# Patient Record
Sex: Female | Born: 1935
Health system: Southern US, Community
[De-identification: ages and names within clinical notes are randomized; demographics above are authoritative.]

## PROBLEM LIST (undated history)

## (undated) DIAGNOSIS — K562 Volvulus: Secondary | ICD-10-CM

## (undated) DIAGNOSIS — G459 Transient cerebral ischemic attack, unspecified: Secondary | ICD-10-CM

## (undated) DIAGNOSIS — I517 Cardiomegaly: Secondary | ICD-10-CM

## (undated) DIAGNOSIS — G20A1 Parkinson's disease without dyskinesia, without mention of fluctuations: Secondary | ICD-10-CM

## (undated) DIAGNOSIS — Q631 Lobulated, fused and horseshoe kidney: Secondary | ICD-10-CM

## (undated) DIAGNOSIS — R600 Localized edema: Secondary | ICD-10-CM

## (undated) DIAGNOSIS — I809 Phlebitis and thrombophlebitis of unspecified site: Secondary | ICD-10-CM

## (undated) DIAGNOSIS — K219 Gastro-esophageal reflux disease without esophagitis: Secondary | ICD-10-CM

## (undated) DIAGNOSIS — I2510 Atherosclerotic heart disease of native coronary artery without angina pectoris: Secondary | ICD-10-CM

## (undated) DIAGNOSIS — K5900 Constipation, unspecified: Secondary | ICD-10-CM

## (undated) DIAGNOSIS — G629 Polyneuropathy, unspecified: Secondary | ICD-10-CM

## (undated) DIAGNOSIS — N183 Chronic kidney disease, stage 3 (moderate): Secondary | ICD-10-CM

## (undated) DIAGNOSIS — G9340 Encephalopathy, unspecified: Secondary | ICD-10-CM

## (undated) DIAGNOSIS — D649 Anemia, unspecified: Secondary | ICD-10-CM

## (undated) DIAGNOSIS — F32A Depression, unspecified: Secondary | ICD-10-CM

## (undated) DIAGNOSIS — K869 Disease of pancreas, unspecified: Secondary | ICD-10-CM

## (undated) DIAGNOSIS — I4891 Unspecified atrial fibrillation: Principal | ICD-10-CM

## (undated) DIAGNOSIS — F329 Major depressive disorder, single episode, unspecified: Secondary | ICD-10-CM

## (undated) DIAGNOSIS — G20 Parkinson's disease: Secondary | ICD-10-CM

## (undated) DIAGNOSIS — R4182 Altered mental status, unspecified: Secondary | ICD-10-CM

## (undated) DIAGNOSIS — K224 Dyskinesia of esophagus: Secondary | ICD-10-CM

## (undated) DIAGNOSIS — Z86010 Personal history of colonic polyps: Secondary | ICD-10-CM

## (undated) DIAGNOSIS — I10 Essential (primary) hypertension: Secondary | ICD-10-CM

## (undated) DIAGNOSIS — E785 Hyperlipidemia, unspecified: Secondary | ICD-10-CM

## (undated) HISTORY — PX: ABDOMINAL HYSTERECTOMY: SHX81

## (undated) HISTORY — PX: BREAST SURGERY: SHX581

## (undated) HISTORY — DX: Disease of pancreas, unspecified: K86.9

## (undated) HISTORY — DX: Volvulus: K56.2

## (undated) HISTORY — DX: Depression, unspecified: F32.A

## (undated) HISTORY — DX: Dyskinesia of esophagus: K22.4

## (undated) HISTORY — PX: FOOT SURGERY: SHX648

## (undated) HISTORY — DX: Personal history of colonic polyps: Z86.010

## (undated) HISTORY — DX: Anemia, unspecified: D64.9

## (undated) HISTORY — DX: Constipation, unspecified: K59.00

## (undated) HISTORY — DX: Lobulated, fused and horseshoe kidney: Q63.1

## (undated) HISTORY — DX: Phlebitis and thrombophlebitis of unspecified site: I80.9

## (undated) HISTORY — PX: ESOPHAGEAL DILATION: SHX303

## (undated) HISTORY — PX: CHOLECYSTECTOMY: SHX55

## (undated) HISTORY — DX: Major depressive disorder, single episode, unspecified: F32.9

## (undated) HISTORY — PX: NOSE SURGERY: SHX723

## (undated) HISTORY — DX: Chronic kidney disease, stage 3 (moderate): N18.3

## (undated) HISTORY — PX: TOTAL HIP ARTHROPLASTY: SHX124

## (undated) HISTORY — DX: Transient cerebral ischemic attack, unspecified: G45.9

## (undated) HISTORY — DX: Polyneuropathy, unspecified: G62.9

## (undated) SURGERY — SIGMOIDOSCOPY, FLEXIBLE
Anesthesia: Moderate Sedation

---

## 1997-09-03 ENCOUNTER — Ambulatory Visit (HOSPITAL_BASED_OUTPATIENT_CLINIC_OR_DEPARTMENT_OTHER): Admission: RE | Admit: 1997-09-03 | Discharge: 1997-09-03 | Payer: Self-pay | Admitting: Urology

## 1997-09-09 ENCOUNTER — Ambulatory Visit (HOSPITAL_COMMUNITY): Admission: RE | Admit: 1997-09-09 | Discharge: 1997-09-09 | Payer: Self-pay | Admitting: Orthopedic Surgery

## 1997-09-23 ENCOUNTER — Ambulatory Visit (HOSPITAL_COMMUNITY): Admission: RE | Admit: 1997-09-23 | Discharge: 1997-09-23 | Payer: Self-pay | Admitting: Orthopedic Surgery

## 1997-10-13 ENCOUNTER — Ambulatory Visit (HOSPITAL_BASED_OUTPATIENT_CLINIC_OR_DEPARTMENT_OTHER): Admission: RE | Admit: 1997-10-13 | Discharge: 1997-10-13 | Payer: Self-pay | Admitting: Orthopedic Surgery

## 1998-02-24 ENCOUNTER — Encounter: Admission: RE | Admit: 1998-02-24 | Discharge: 1998-05-25 | Payer: Self-pay

## 1998-03-18 ENCOUNTER — Encounter: Admission: RE | Admit: 1998-03-18 | Discharge: 1998-06-16 | Payer: Self-pay

## 1998-11-23 ENCOUNTER — Encounter: Payer: Self-pay | Admitting: Internal Medicine

## 1998-11-23 ENCOUNTER — Ambulatory Visit (HOSPITAL_COMMUNITY): Admission: RE | Admit: 1998-11-23 | Discharge: 1998-11-23 | Payer: Self-pay | Admitting: Internal Medicine

## 1999-05-16 ENCOUNTER — Ambulatory Visit (HOSPITAL_COMMUNITY): Admission: RE | Admit: 1999-05-16 | Discharge: 1999-05-16 | Payer: Self-pay | Admitting: Internal Medicine

## 1999-05-16 ENCOUNTER — Encounter: Payer: Self-pay | Admitting: Internal Medicine

## 1999-06-06 HISTORY — PX: SHOULDER SURGERY: SHX246

## 1999-06-14 ENCOUNTER — Encounter: Admission: RE | Admit: 1999-06-14 | Discharge: 1999-06-14 | Payer: Self-pay | Admitting: Internal Medicine

## 1999-06-14 ENCOUNTER — Encounter: Payer: Self-pay | Admitting: Internal Medicine

## 1999-06-24 ENCOUNTER — Other Ambulatory Visit: Admission: RE | Admit: 1999-06-24 | Discharge: 1999-06-24 | Payer: Self-pay | Admitting: Gastroenterology

## 1999-06-24 DIAGNOSIS — Z860101 Personal history of adenomatous and serrated colon polyps: Secondary | ICD-10-CM

## 1999-06-24 DIAGNOSIS — Z86010 Personal history of colonic polyps: Secondary | ICD-10-CM

## 1999-06-24 HISTORY — DX: Personal history of colonic polyps: Z86.010

## 1999-06-24 HISTORY — DX: Personal history of adenomatous and serrated colon polyps: Z86.0101

## 1999-08-19 ENCOUNTER — Encounter: Admission: RE | Admit: 1999-08-19 | Discharge: 1999-08-19 | Payer: Self-pay | Admitting: Orthopaedic Surgery

## 1999-08-22 ENCOUNTER — Ambulatory Visit (HOSPITAL_BASED_OUTPATIENT_CLINIC_OR_DEPARTMENT_OTHER): Admission: RE | Admit: 1999-08-22 | Discharge: 1999-08-22 | Payer: Self-pay | Admitting: Orthopaedic Surgery

## 1999-12-02 ENCOUNTER — Encounter: Admission: RE | Admit: 1999-12-02 | Discharge: 1999-12-21 | Payer: Self-pay | Admitting: Orthopaedic Surgery

## 2000-02-21 ENCOUNTER — Encounter: Payer: Self-pay | Admitting: Internal Medicine

## 2000-02-21 ENCOUNTER — Encounter: Admission: RE | Admit: 2000-02-21 | Discharge: 2000-02-21 | Payer: Self-pay | Admitting: Internal Medicine

## 2000-05-08 ENCOUNTER — Encounter: Admission: RE | Admit: 2000-05-08 | Discharge: 2000-05-08 | Payer: Self-pay | Admitting: Urology

## 2000-05-08 ENCOUNTER — Encounter: Payer: Self-pay | Admitting: Urology

## 2000-08-10 ENCOUNTER — Ambulatory Visit (HOSPITAL_BASED_OUTPATIENT_CLINIC_OR_DEPARTMENT_OTHER): Admission: RE | Admit: 2000-08-10 | Discharge: 2000-08-10 | Payer: Self-pay | Admitting: Internal Medicine

## 2011-05-08 DIAGNOSIS — R488 Other symbolic dysfunctions: Secondary | ICD-10-CM | POA: Diagnosis not present

## 2011-05-08 DIAGNOSIS — I2510 Atherosclerotic heart disease of native coronary artery without angina pectoris: Secondary | ICD-10-CM | POA: Diagnosis not present

## 2011-05-08 DIAGNOSIS — Z9181 History of falling: Secondary | ICD-10-CM | POA: Diagnosis not present

## 2011-05-08 DIAGNOSIS — H409 Unspecified glaucoma: Secondary | ICD-10-CM | POA: Diagnosis not present

## 2011-05-08 DIAGNOSIS — R279 Unspecified lack of coordination: Secondary | ICD-10-CM | POA: Diagnosis not present

## 2011-05-08 DIAGNOSIS — F329 Major depressive disorder, single episode, unspecified: Secondary | ICD-10-CM | POA: Diagnosis not present

## 2011-05-08 DIAGNOSIS — G20 Parkinson's disease: Secondary | ICD-10-CM | POA: Diagnosis not present

## 2011-05-08 DIAGNOSIS — G9349 Other encephalopathy: Secondary | ICD-10-CM | POA: Diagnosis not present

## 2011-05-08 DIAGNOSIS — M6281 Muscle weakness (generalized): Secondary | ICD-10-CM | POA: Diagnosis not present

## 2011-05-08 DIAGNOSIS — I10 Essential (primary) hypertension: Secondary | ICD-10-CM | POA: Diagnosis not present

## 2011-05-08 DIAGNOSIS — E119 Type 2 diabetes mellitus without complications: Secondary | ICD-10-CM | POA: Diagnosis not present

## 2011-05-08 DIAGNOSIS — I4891 Unspecified atrial fibrillation: Secondary | ICD-10-CM | POA: Diagnosis not present

## 2011-05-10 DIAGNOSIS — M6281 Muscle weakness (generalized): Secondary | ICD-10-CM | POA: Diagnosis not present

## 2011-05-10 DIAGNOSIS — G20 Parkinson's disease: Secondary | ICD-10-CM | POA: Diagnosis not present

## 2011-05-10 DIAGNOSIS — R279 Unspecified lack of coordination: Secondary | ICD-10-CM | POA: Diagnosis not present

## 2011-05-10 DIAGNOSIS — R488 Other symbolic dysfunctions: Secondary | ICD-10-CM | POA: Diagnosis not present

## 2011-05-10 DIAGNOSIS — E119 Type 2 diabetes mellitus without complications: Secondary | ICD-10-CM | POA: Diagnosis not present

## 2011-05-10 DIAGNOSIS — G9349 Other encephalopathy: Secondary | ICD-10-CM | POA: Diagnosis not present

## 2011-05-12 DIAGNOSIS — M6281 Muscle weakness (generalized): Secondary | ICD-10-CM | POA: Diagnosis not present

## 2011-05-12 DIAGNOSIS — G20 Parkinson's disease: Secondary | ICD-10-CM | POA: Diagnosis not present

## 2011-05-12 DIAGNOSIS — R488 Other symbolic dysfunctions: Secondary | ICD-10-CM | POA: Diagnosis not present

## 2011-05-12 DIAGNOSIS — R279 Unspecified lack of coordination: Secondary | ICD-10-CM | POA: Diagnosis not present

## 2011-05-12 DIAGNOSIS — G9349 Other encephalopathy: Secondary | ICD-10-CM | POA: Diagnosis not present

## 2011-05-12 DIAGNOSIS — E119 Type 2 diabetes mellitus without complications: Secondary | ICD-10-CM | POA: Diagnosis not present

## 2011-05-15 DIAGNOSIS — R488 Other symbolic dysfunctions: Secondary | ICD-10-CM | POA: Diagnosis not present

## 2011-05-15 DIAGNOSIS — M6281 Muscle weakness (generalized): Secondary | ICD-10-CM | POA: Diagnosis not present

## 2011-05-15 DIAGNOSIS — E119 Type 2 diabetes mellitus without complications: Secondary | ICD-10-CM | POA: Diagnosis not present

## 2011-05-15 DIAGNOSIS — G20 Parkinson's disease: Secondary | ICD-10-CM | POA: Diagnosis not present

## 2011-05-15 DIAGNOSIS — G9349 Other encephalopathy: Secondary | ICD-10-CM | POA: Diagnosis not present

## 2011-05-15 DIAGNOSIS — R279 Unspecified lack of coordination: Secondary | ICD-10-CM | POA: Diagnosis not present

## 2011-05-17 DIAGNOSIS — R279 Unspecified lack of coordination: Secondary | ICD-10-CM | POA: Diagnosis not present

## 2011-05-17 DIAGNOSIS — G20 Parkinson's disease: Secondary | ICD-10-CM | POA: Diagnosis not present

## 2011-05-17 DIAGNOSIS — E119 Type 2 diabetes mellitus without complications: Secondary | ICD-10-CM | POA: Diagnosis not present

## 2011-05-17 DIAGNOSIS — R488 Other symbolic dysfunctions: Secondary | ICD-10-CM | POA: Diagnosis not present

## 2011-05-17 DIAGNOSIS — M6281 Muscle weakness (generalized): Secondary | ICD-10-CM | POA: Diagnosis not present

## 2011-05-17 DIAGNOSIS — G9349 Other encephalopathy: Secondary | ICD-10-CM | POA: Diagnosis not present

## 2011-05-18 DIAGNOSIS — M6281 Muscle weakness (generalized): Secondary | ICD-10-CM | POA: Diagnosis not present

## 2011-05-18 DIAGNOSIS — R488 Other symbolic dysfunctions: Secondary | ICD-10-CM | POA: Diagnosis not present

## 2011-05-18 DIAGNOSIS — R279 Unspecified lack of coordination: Secondary | ICD-10-CM | POA: Diagnosis not present

## 2011-05-18 DIAGNOSIS — G9349 Other encephalopathy: Secondary | ICD-10-CM | POA: Diagnosis not present

## 2011-05-18 DIAGNOSIS — G20 Parkinson's disease: Secondary | ICD-10-CM | POA: Diagnosis not present

## 2011-05-18 DIAGNOSIS — E119 Type 2 diabetes mellitus without complications: Secondary | ICD-10-CM | POA: Diagnosis not present

## 2011-05-19 DIAGNOSIS — R279 Unspecified lack of coordination: Secondary | ICD-10-CM | POA: Diagnosis not present

## 2011-05-19 DIAGNOSIS — G20 Parkinson's disease: Secondary | ICD-10-CM | POA: Diagnosis not present

## 2011-05-19 DIAGNOSIS — R488 Other symbolic dysfunctions: Secondary | ICD-10-CM | POA: Diagnosis not present

## 2011-05-19 DIAGNOSIS — E119 Type 2 diabetes mellitus without complications: Secondary | ICD-10-CM | POA: Diagnosis not present

## 2011-05-19 DIAGNOSIS — M6281 Muscle weakness (generalized): Secondary | ICD-10-CM | POA: Diagnosis not present

## 2011-05-19 DIAGNOSIS — G9349 Other encephalopathy: Secondary | ICD-10-CM | POA: Diagnosis not present

## 2011-05-22 ENCOUNTER — Emergency Department (HOSPITAL_COMMUNITY): Payer: Medicare Other

## 2011-05-22 ENCOUNTER — Inpatient Hospital Stay (HOSPITAL_COMMUNITY)
Admission: EM | Admit: 2011-05-22 | Discharge: 2011-05-24 | DRG: 309 | Disposition: A | Discharge status: Skilled Nursing Facility | Payer: Medicare Other | Attending: Internal Medicine | Admitting: Internal Medicine

## 2011-05-22 ENCOUNTER — Other Ambulatory Visit: Payer: Self-pay

## 2011-05-22 ENCOUNTER — Encounter: Payer: Self-pay | Admitting: Physical Medicine and Rehabilitation

## 2011-05-22 DIAGNOSIS — I5032 Chronic diastolic (congestive) heart failure: Secondary | ICD-10-CM | POA: Diagnosis present

## 2011-05-22 DIAGNOSIS — K8689 Other specified diseases of pancreas: Secondary | ICD-10-CM

## 2011-05-22 DIAGNOSIS — J90 Pleural effusion, not elsewhere classified: Secondary | ICD-10-CM

## 2011-05-22 DIAGNOSIS — S301XXA Contusion of abdominal wall, initial encounter: Secondary | ICD-10-CM

## 2011-05-22 DIAGNOSIS — E119 Type 2 diabetes mellitus without complications: Secondary | ICD-10-CM | POA: Diagnosis present

## 2011-05-22 DIAGNOSIS — G9349 Other encephalopathy: Secondary | ICD-10-CM | POA: Diagnosis not present

## 2011-05-22 DIAGNOSIS — R488 Other symbolic dysfunctions: Secondary | ICD-10-CM | POA: Diagnosis not present

## 2011-05-22 DIAGNOSIS — Z23 Encounter for immunization: Secondary | ICD-10-CM

## 2011-05-22 DIAGNOSIS — K869 Disease of pancreas, unspecified: Secondary | ICD-10-CM

## 2011-05-22 DIAGNOSIS — I4891 Unspecified atrial fibrillation: Principal | ICD-10-CM | POA: Diagnosis present

## 2011-05-22 DIAGNOSIS — G20 Parkinson's disease: Secondary | ICD-10-CM | POA: Diagnosis not present

## 2011-05-22 DIAGNOSIS — R8281 Pyuria: Secondary | ICD-10-CM

## 2011-05-22 DIAGNOSIS — I509 Heart failure, unspecified: Secondary | ICD-10-CM | POA: Diagnosis present

## 2011-05-22 DIAGNOSIS — G20A1 Parkinson's disease without dyskinesia, without mention of fluctuations: Secondary | ICD-10-CM | POA: Diagnosis present

## 2011-05-22 DIAGNOSIS — M6281 Muscle weakness (generalized): Secondary | ICD-10-CM | POA: Diagnosis not present

## 2011-05-22 DIAGNOSIS — N390 Urinary tract infection, site not specified: Secondary | ICD-10-CM

## 2011-05-22 DIAGNOSIS — Z7982 Long term (current) use of aspirin: Secondary | ICD-10-CM

## 2011-05-22 DIAGNOSIS — Z7902 Long term (current) use of antithrombotics/antiplatelets: Secondary | ICD-10-CM

## 2011-05-22 DIAGNOSIS — R279 Unspecified lack of coordination: Secondary | ICD-10-CM | POA: Diagnosis not present

## 2011-05-22 DIAGNOSIS — K219 Gastro-esophageal reflux disease without esophagitis: Secondary | ICD-10-CM | POA: Diagnosis present

## 2011-05-22 DIAGNOSIS — R002 Palpitations: Secondary | ICD-10-CM

## 2011-05-22 DIAGNOSIS — R0789 Other chest pain: Secondary | ICD-10-CM | POA: Diagnosis present

## 2011-05-22 HISTORY — DX: Encephalopathy, unspecified: G93.40

## 2011-05-22 HISTORY — DX: Gastro-esophageal reflux disease without esophagitis: K21.9

## 2011-05-22 HISTORY — DX: Parkinson's disease: G20

## 2011-05-22 HISTORY — DX: Atherosclerotic heart disease of native coronary artery without angina pectoris: I25.10

## 2011-05-22 HISTORY — DX: Parkinson's disease without dyskinesia, without mention of fluctuations: G20.A1

## 2011-05-22 HISTORY — DX: Hyperlipidemia, unspecified: E78.5

## 2011-05-22 HISTORY — DX: Essential (primary) hypertension: I10

## 2011-05-22 HISTORY — DX: Disease of pancreas, unspecified: K86.9

## 2011-05-22 HISTORY — DX: Altered mental status, unspecified: R41.82

## 2011-05-22 HISTORY — DX: Unspecified atrial fibrillation: I48.91

## 2011-05-22 LAB — PROTIME-INR
INR: 1.1 (ref 0.00–1.49)
Prothrombin Time: 14.4 seconds (ref 11.6–15.2)

## 2011-05-22 LAB — APTT: aPTT: 30 seconds (ref 24–37)

## 2011-05-22 LAB — URINE MICROSCOPIC-ADD ON

## 2011-05-22 LAB — BASIC METABOLIC PANEL
BUN: 10 mg/dL (ref 6–23)
Calcium: 9.1 mg/dL (ref 8.4–10.5)
Creatinine, Ser: 0.69 mg/dL (ref 0.50–1.10)
GFR calc Af Amer: >90 mL/min (ref >90)

## 2011-05-22 LAB — URINALYSIS, ROUTINE W REFLEX MICROSCOPIC
Bilirubin Urine: NEGATIVE
Ketones, ur: NEGATIVE mg/dL
Nitrite: NEGATIVE
Protein, ur: NEGATIVE mg/dL
Urobilinogen, UA: 0.2 mg/dL (ref 0.0–1.0)
pH: 7.5 (ref 5.0–8.0)

## 2011-05-22 LAB — DIFFERENTIAL
Basophils Absolute: 0 10*3/uL (ref 0.0–0.1)
Basophils Relative: 0 % (ref 0–1)
Eosinophils Relative: 2 % (ref 0–5)
Lymphocytes Relative: 20 % (ref 12–46)
Monocytes Absolute: 0.3 10*3/uL (ref 0.1–1.0)

## 2011-05-22 LAB — CBC
HCT: 35.2 % — ABNORMAL LOW (ref 36.0–46.0)
MCH: 32.5 pg (ref 26.0–34.0)
MCHC: 32.4 g/dL (ref 30.0–36.0)
MCV: 97.8 fL (ref 78.0–100.0)
Platelets: 116 10*3/uL — ABNORMAL LOW (ref 150–400)
Platelets: 143 10*3/uL — ABNORMAL LOW (ref 150–400)
RBC: 3.66 MIL/uL — ABNORMAL LOW (ref 3.87–5.11)
RDW: 14 % (ref 11.5–15.5)
WBC: 5.1 10*3/uL (ref 4.0–10.5)
WBC: 5.9 10*3/uL (ref 4.0–10.5)

## 2011-05-22 LAB — CREATININE, SERUM
Creatinine, Ser: 0.68 mg/dL (ref 0.50–1.10)
GFR calc Af Amer: >90 mL/min (ref >90)

## 2011-05-22 LAB — PRO B NATRIURETIC PEPTIDE: Pro B Natriuretic peptide (BNP): 2005 pg/mL — ABNORMAL HIGH (ref 0–450)

## 2011-05-22 MED ORDER — ACETAMINOPHEN 650 MG RE SUPP: 650.0000 mg | Freq: Four times a day (QID) | RECTAL | Status: DC | PRN

## 2011-05-22 MED ORDER — DORZOLAMIDE HCL 2 % OP SOLN
1.0000 [drp] | Freq: Three times a day (TID) | OPHTHALMIC | Status: DC
  Administered 2011-05-22 – 2011-05-23 (×4): 1 [drp] via OPHTHALMIC
  Filled 2011-05-22: qty 10

## 2011-05-22 MED ORDER — PANTOPRAZOLE SODIUM 40 MG PO TBEC
40.0000 mg | DELAYED_RELEASE_TABLET | Freq: Every day | ORAL | Status: DC
  Administered 2011-05-23 (×2): 40 mg via ORAL
  Filled 2011-05-22 (×2): qty 1

## 2011-05-22 MED ORDER — INSULIN ASPART 100 UNIT/ML ~~LOC~~ SOLN
0.0000 [IU] | SUBCUTANEOUS | Status: DC
  Administered 2011-05-23: 3 [IU] via SUBCUTANEOUS
  Administered 2011-05-23: 2 [IU] via SUBCUTANEOUS
  Administered 2011-05-23: 1 [IU] via SUBCUTANEOUS
  Administered 2011-05-23: 2 [IU] via SUBCUTANEOUS
  Administered 2011-05-23: 3 [IU] via SUBCUTANEOUS
  Administered 2011-05-23 – 2011-05-24 (×3): 2 [IU] via SUBCUTANEOUS
  Filled 2011-05-22: qty 3

## 2011-05-22 MED ORDER — PATIENT'S GUIDE TO USING COUMADIN BOOK
Freq: Once | Status: DC
  Filled 2011-05-22 (×2): qty 1

## 2011-05-22 MED ORDER — CLOPIDOGREL BISULFATE 75 MG PO TABS
75.0000 mg | ORAL_TABLET | Freq: Every day | ORAL | Status: DC
  Administered 2011-05-22 – 2011-05-24 (×3): 75 mg via ORAL
  Filled 2011-05-22 (×3): qty 1

## 2011-05-22 MED ORDER — TRAVOPROST (BAK FREE) 0.004 % OP SOLN
1.0000 [drp] | Freq: Every day | OPHTHALMIC | Status: DC
  Administered 2011-05-22 – 2011-05-23 (×2): 1 [drp] via OPHTHALMIC
  Filled 2011-05-22: qty 2.5

## 2011-05-22 MED ORDER — LISINOPRIL 10 MG PO TABS
10.0000 mg | ORAL_TABLET | Freq: Every day | ORAL | Status: DC
  Administered 2011-05-22 – 2011-05-24 (×3): 10 mg via ORAL
  Filled 2011-05-22 (×3): qty 1

## 2011-05-22 MED ORDER — OLOPATADINE HCL 0.1 % OP SOLN
1.0000 [drp] | Freq: Every day | OPHTHALMIC | Status: DC
  Administered 2011-05-23: 1 [drp] via OPHTHALMIC
  Filled 2011-05-22 (×2): qty 5

## 2011-05-22 MED ORDER — GABAPENTIN 100 MG PO CAPS
100.0000 mg | ORAL_CAPSULE | Freq: Two times a day (BID) | ORAL | Status: DC
  Administered 2011-05-22 – 2011-05-24 (×4): 100 mg via ORAL
  Filled 2011-05-22 (×5): qty 1

## 2011-05-22 MED ORDER — SODIUM CHLORIDE 0.9 % IJ SOLN
3.0000 mL | Freq: Two times a day (BID) | INTRAMUSCULAR | Status: DC
  Administered 2011-05-23 – 2011-05-24 (×2): 3 mL via INTRAVENOUS

## 2011-05-22 MED ORDER — ACETAMINOPHEN 325 MG PO TABS
650.0000 mg | ORAL_TABLET | Freq: Four times a day (QID) | ORAL | Status: DC | PRN
  Administered 2011-05-23: 650 mg via ORAL
  Filled 2011-05-22: qty 2

## 2011-05-22 MED ORDER — FUROSEMIDE 10 MG/ML IJ SOLN
40.0000 mg | Freq: Once | INTRAMUSCULAR | Status: AC
  Administered 2011-05-22: 40 mg via INTRAVENOUS
  Filled 2011-05-22: qty 4

## 2011-05-22 MED ORDER — POLYVINYL ALCOHOL 1.4 % OP SOLN
1.0000 [drp] | Freq: Four times a day (QID) | OPHTHALMIC | Status: DC
  Administered 2011-05-22 – 2011-05-23 (×5): 1 [drp] via OPHTHALMIC
  Filled 2011-05-22: qty 15

## 2011-05-22 MED ORDER — ACETAMINOPHEN 325 MG PO TABS
650.0000 mg | ORAL_TABLET | Freq: Once | ORAL | Status: AC
  Administered 2011-05-22: 650 mg via ORAL
  Filled 2011-05-22: qty 2

## 2011-05-22 MED ORDER — IOHEXOL 300 MG/ML  SOLN: 100.0000 mL | Freq: Once | INTRAMUSCULAR | Status: DC | PRN

## 2011-05-22 MED ORDER — HEPARIN BOLUS VIA INFUSION
3000.0000 [IU] | Freq: Once | INTRAVENOUS | Status: AC
  Administered 2011-05-22: 3000 [IU] via INTRAVENOUS
  Filled 2011-05-22: qty 3000

## 2011-05-22 MED ORDER — WARFARIN VIDEO: Freq: Once | Status: DC

## 2011-05-22 MED ORDER — METOPROLOL TARTRATE 50 MG PO TABS
50.0000 mg | ORAL_TABLET | Freq: Two times a day (BID) | ORAL | Status: DC
  Administered 2011-05-22 – 2011-05-23 (×2): 50 mg via ORAL
  Filled 2011-05-22 (×3): qty 1

## 2011-05-22 MED ORDER — WARFARIN SODIUM 5 MG PO TABS
5.0000 mg | ORAL_TABLET | ORAL | Status: AC
  Administered 2011-05-22: 5 mg via ORAL
  Filled 2011-05-22: qty 1

## 2011-05-22 MED ORDER — POLYETHYL GLYCOL-PROPYL GLYCOL 0.4-0.3 % OP SOLN: 1.0000 [drp] | Freq: Four times a day (QID) | OPHTHALMIC | Status: DC

## 2011-05-22 MED ORDER — HEPARIN SOD (PORCINE) IN D5W 100 UNIT/ML IV SOLN
850.0000 [IU]/h | INTRAVENOUS | Status: DC
  Administered 2011-05-22: 850 [IU]/h via INTRAVENOUS
  Filled 2011-05-22 (×2): qty 250

## 2011-05-22 MED ORDER — TRAZODONE HCL 100 MG PO TABS
100.0000 mg | ORAL_TABLET | Freq: Every day | ORAL | Status: DC
  Administered 2011-05-22 – 2011-05-23 (×2): 100 mg via ORAL
  Filled 2011-05-22 (×3): qty 1

## 2011-05-22 MED ORDER — SIMVASTATIN 10 MG PO TABS
10.0000 mg | ORAL_TABLET | Freq: Every day | ORAL | Status: DC
  Administered 2011-05-23: 10 mg via ORAL
  Filled 2011-05-22 (×2): qty 1

## 2011-05-22 MED ORDER — POLYVINYL ALCOHOL 1.4 % OP SOLN: 1.0000 [drp] | Freq: Every day | OPHTHALMIC | Status: DC

## 2011-05-22 MED ORDER — DEXTROSE 5 % IV SOLN
1.0000 g | Freq: Once | INTRAVENOUS | Status: AC
  Administered 2011-05-22: 1 g via INTRAVENOUS
  Filled 2011-05-22: qty 10

## 2011-05-22 MED ORDER — SODIUM CHLORIDE 0.9 % IV SOLN
INTRAVENOUS | Status: DC
  Administered 2011-05-22 (×2): via INTRAVENOUS

## 2011-05-22 NOTE — ED Notes (Signed)
Pt resting quietly at the time. Remains on cardiac monitor. Vital signs stable. Pt is alert and answering questions appropriately. Family at bedside.

## 2011-05-22 NOTE — ED Notes (Signed)
Pt in CT scan at the time. Family remains at the bedside.

## 2011-05-22 NOTE — ED Provider Notes (Cosign Needed)
History     CSN: [id number] Arrival date & time: 05/22/2011  9:21 AM   Chief Complaint  Patient presents with  . Atrial Fibrillation  . Dizziness   The history is provided by the patient, the nursing home and the EMS personnel. History Limited By: poor historian.   Pt was seen at 0940.  Per pt, c/o gradual onset and persistence of constant "palpitations" since waking up this morning.  Palpitations were associated with SOB and "dizziness."  Pt also c/o gradual onset and worsening of persistent pedal edema for the past week.  Denies fevers, no cough, no abd pain, no N/V/D, no back pain, no CP.  Past Medical History  Diagnosis Date  . Atrial fibrillation   . Altered mental status   . Encephalopathy   . Parkinson disease   . Hypertension   . GERD (gastroesophageal reflux disease)   . Hyperlipemia   . Diabetes mellitus     No past surgical history on file.   History  Substance Use Topics  . Smoking status: Never Smoker   . Smokeless tobacco: Not on file  . Alcohol Use: No     Review of Systems  Unable to perform ROS: Other    Allergies  Review of patient's allergies indicates no known allergies.  Home Medications   Current Outpatient Rx  Name Route Sig Dispense Refill  . ACETAMINOPHEN 325 MG PO TABS Oral Take 650 mg by mouth every 6 (six) hours as needed. Pain or fever     . ASPIRIN 81 MG PO TABS Oral Take 81 mg by mouth daily.      Marland Kitchen BAZA EX Apply externally Apply 1 application topically 3 (three) times daily.      . CHOLECALCIFEROL 1000 UNITS PO CAPS Oral Take 1,000 Units by mouth daily.      Marland Kitchen CLOPIDOGREL BISULFATE 75 MG PO TABS Oral Take 75 mg by mouth daily.      . DORZOLAMIDE HCL 2 % OP SOLN Both Eyes Place 1 drop into both eyes 3 (three) times daily.      . FUROSEMIDE 20 MG PO TABS Oral Take 20 mg by mouth 2 (two) times daily.      Marland Kitchen GABAPENTIN 100 MG PO CAPS Oral Take 100 mg by mouth 2 (two) times daily.     Marland Kitchen LISINOPRIL 10 MG PO TABS Oral Take 10 mg by  mouth daily.      Marland Kitchen METFORMIN HCL 500 MG PO TABS Oral Take 500 mg by mouth 2 (two) times daily with a meal.      . METOPROLOL TARTRATE 50 MG PO TABS Oral Take 50 mg by mouth 2 (two) times daily.      . OLOPATADINE HCL 0.1 % OP SOLN Both Eyes Place 1 drop into both eyes daily.      Marland Kitchen OMEPRAZOLE 20 MG PO CPDR Oral Take 20 mg by mouth daily.      Marland Kitchen POLYETHYL GLYCOL-PROPYL GLYCOL 0.4-0.3 % OP SOLN Ophthalmic Apply 1 drop to eye 4 (four) times daily.      Marland Kitchen POLYVINYL ALCOHOL 1.4 % OP SOLN Both Eyes Place 1 drop into both eyes daily.      Marland Kitchen POTASSIUM CHLORIDE CRYS CR 20 MEQ PO TBCR Oral Take 20 mEq by mouth daily.     Marland Kitchen PRAVASTATIN SODIUM 20 MG PO TABS Oral Take 20 mg by mouth daily.      . TRAMADOL HCL 50 MG PO TABS Oral Take 50 mg by mouth  every 6 (six) hours as needed. For pain     . TRAVOPROST (BAK FREE) 0.004 % OP SOLN Both Eyes Place 1 drop into both eyes at bedtime.      . TRAZODONE HCL 50 MG PO TABS Oral Take 100 mg by mouth at bedtime.       BP 173/80  Pulse 77  Temp(Src) 98.7 F (37.1 C) (Oral)  Resp 14  SpO2 99%  Physical Exam 0945: Physical examination:  Nursing notes reviewed; Vital signs and O2 SAT reviewed;  Constitutional: Well developed, Well nourished, In no acute distress; Head:  Normocephalic, atraumatic; Eyes: EOMI, PERRL, No scleral icterus; ENMT: Mouth and pharynx normal, Mucous membranes dry; Neck: Supple, Full range of motion, No lymphadenopathy; Cardiovascular: Irregular irregular rate and rhythm, No murmur or gallop; Respiratory: Breath sounds clear & equal bilaterally, No rales, rhonchi, wheezes, or rub, Normal respiratory effort/excursion; Chest: Nontender, Movement normal; Abdomen: Soft, +large ecchymosis to right lower abd wall with localized TTP otherwise abd is nontender, Nondistended, Normal bowel sounds; Genitourinary: No CVA tenderness; Extremities: Pulses normal, No tenderness, 2+ pedal edema bilat, No calf edema or asymmetry.; Neuro: AA&Ox3, poor historian,  +HOH, otherwise major CN grossly intact.  No facial droop, speech clear.  No gross focal motor or sensory deficits in extremities.; Skin: Color normal, Warm, Dry, no rash.   ED Course  Procedures   0945:  Pt has large ecchymosis on her right lower abd area.  Cannot tell me what happened or how long it has been there.  Will CT A/P.    MDM  MDM Reviewed: vitals and nursing note Reviewed previous: labs Interpretation: ECG, labs, x-ray and CT scan    Date: 05/22/2011  Rate: 92  Rhythm: atrial fibrillation  QRS Axis: normal  Intervals: normal  ST/T Wave abnormalities: normal  Conduction Disutrbances:none  Narrative Interpretation:   Old EKG Reviewed: none available.  Results for orders placed during the hospital encounter of 05/22/11  BASIC METABOLIC PANEL      Component Value Range   Sodium 139  135 - 145 (mEq/L)   Potassium 3.8  3.5 - 5.1 (mEq/L)   Chloride 102  96 - 112 (mEq/L)   CO2 28  19 - 32 (mEq/L)   Glucose, Bld 269 (*) 70 - 99 (mg/dL)   BUN 10  6 - 23 (mg/dL)   Creatinine, Ser 8.11  0.50 - 1.10 (mg/dL)   Calcium 9.1  8.4 - 91.4 (mg/dL)   GFR calc non Af Amer 78 (*) >90 (mL/min)   GFR calc Af Amer >90  >90 (mL/min)  CBC      Component Value Range   WBC 5.1  4.0 - 10.5 (K/uL)   RBC 3.60 (*) 3.87 - 5.11 (MIL/uL)   Hemoglobin 11.4 (*) 12.0 - 15.0 (g/dL)   HCT 78.2 (*) 95.6 - 46.0 (%)   MCV 97.8  78.0 - 100.0 (fL)   MCH 31.7  26.0 - 34.0 (pg)   MCHC 32.4  30.0 - 36.0 (g/dL)   RDW 21.3  08.6 - 57.8 (%)   Platelets 116 (*) 150 - 400 (K/uL)  DIFFERENTIAL      Component Value Range   Neutrophils Relative 71  43 - 77 (%)   Neutro Abs 3.6  1.7 - 7.7 (K/uL)   Lymphocytes Relative 20  12 - 46 (%)   Lymphs Abs 1.0  0.7 - 4.0 (K/uL)   Monocytes Relative 7  3 - 12 (%)   Monocytes Absolute 0.3  0.1 -  1.0 (K/uL)   Eosinophils Relative 2  0 - 5 (%)   Eosinophils Absolute 0.1  0.0 - 0.7 (K/uL)   Basophils Relative 0  0 - 1 (%)   Basophils Absolute 0.0  0.0 - 0.1 (K/uL)    URINALYSIS, ROUTINE W REFLEX MICROSCOPIC      Component Value Range   Color, Urine YELLOW  YELLOW    APPearance CLEAR  CLEAR    Specific Gravity, Urine 1.004 (*) 1.005 - 1.030    pH 7.5  5.0 - 8.0    Glucose, UA 100 (*) NEGATIVE (mg/dL)   Hgb urine dipstick SMALL (*) NEGATIVE    Bilirubin Urine NEGATIVE  NEGATIVE    Ketones, ur NEGATIVE  NEGATIVE (mg/dL)   Protein, ur NEGATIVE  NEGATIVE (mg/dL)   Urobilinogen, UA 0.2  0.0 - 1.0 (mg/dL)   Nitrite NEGATIVE  NEGATIVE    Leukocytes, UA LARGE (*) NEGATIVE   PRO B NATRIURETIC PEPTIDE      Component Value Range   Pro B Natriuretic peptide (BNP) 2005.0 (*) 0 - 450 (pg/mL)  PROTIME-INR      Component Value Range   Prothrombin Time 14.4  11.6 - 15.2 (seconds)   INR 1.10  0.00 - 1.49   APTT      Component Value Range   aPTT 30  24 - 37 (seconds)  POCT I-STAT TROPONIN I      Component Value Range   Troponin i, poc 0.02  0.00 - 0.08 (ng/mL)   Comment 3           URINE MICROSCOPIC-ADD ON      Component Value Range   Squamous Epithelial / LPF FEW (*) RARE    WBC, UA TOO NUMEROUS TO COUNT  <3 (WBC/hpf)   RBC / HPF 0-2  <3 (RBC/hpf)   Bacteria, UA RARE  RARE    Dg Chest 2 View  05/22/2011  *RADIOLOGY REPORT*  Clinical Data: Palpitations.  History of atrial fibrillation. Hypertension and diabetes.  CHEST - 2 VIEW  Comparison: None.  Findings: There is cardiomegaly.  There are no edematous changes. There is vague density seen lateral to the right hilum which may represent a focal infiltrate or subtle mass.  Linear atelectasis/scarring is seen within the left lung base.  Recommend comparison with outside old films of these are available.  If these are unavailable, recommend follow-up chest x-ray in 2-3 weeks.  The central pulmonary arteries appear prominent bilaterally.  The osseous structures are unremarkable for this patient's age.  IMPRESSION:  1.  Cardiomegaly. 2.  Vague density lateral to the right hilum which may represent a mass or focal  infiltrate. Recommend follow-up chest x-ray in 2-3 weeks.  3.  Linear atelectasis/scarring within the left base.  Original Report Authenticated By: Rolla Plate, M.D.   Ct Abdomen Pelvis W Contrast  05/22/2011  *RADIOLOGY REPORT*  Clinical Data: Large bruise involving the right lower abdominal quadrant.  CT ABDOMEN AND PELVIS WITH CONTRAST  Technique:  Multidetector CT imaging of the abdomen and pelvis was performed following the standard protocol during bolus administration of intravenous contrast.  Contrast:  100 ml Omnipaque-300  Comparison: None available  Findings:  Normal hepatic contour.  No discrete hepatic lesions.  Post cholecystectomy.  No intra or extrahepatic biliary ductal dilatation.  No ascites.  Incidental note is made of horseshoe kidney.  No discrete renal lesions.  No urinary obstruction.  Possible nonobstructing stone within the left kidney (image 38, series 2).  The bilateral  adrenal glands are normal.  Normal appearance of the spleen.  There is an ill defined approximately 1.0 x 2.2 cm hypoattenuating lesion within the pancreatic body.  This lesion is suboptimally evaluated secondary to phase of enhancement and the without associated pancreatic ductal dilatation or distal atrophy.  There is no definite involvement of the adjacent vasculature.  Enteric contrast extends to the descending colon.  Moderate gaseous distension of the colon without evidence of enteric obstruction. No pneumoperitoneum, pneumatosis or portal venous gas.  There is extensive atherosclerotic calcifications within a normal caliber abdominal aorta.  There are extensive atherosclerotic calcifications within the origin of the celiac and proximal aspect of the SMA, however the distal branches of the SMA appear patent. The IMA remains patent.  No retroperitoneal, mesenteric, pelvic or inguinal lymphadenopathy.  Post hysterectomy.  No free fluid within the pelvis.  Limited visualization of the lower thorax demonstrates  small bilateral pleural effusions and bibasilar atelectasis. Cardiomegaly.  Calcifications within the mitral valve annulus.  No pericardial effusion.  No acute aggressive osseous abnormalities.  Lumbar spine degenerative change.  Post left total hip replacement, and poorly evaluated.  There is a minimal amount of stranding within the subcutaneous tissues of the right lower abdominal quadrant without radiopaque foreign body or definable fluid collection or hematoma.  IMPRESSION:     1.    Minimal amount of stranding within the subcutaneous tissues       of the right lower abdomen without radiopaque foreign body or       definable fluid collection or hematoma.        2.    Indeterminate approximately 2.2 cm hypoattenuating lesion within the pancreatic body, nonspecific, but may represent either a pseudocyst or small intraductal papillary mucinous neoplasm. This finding is not associated pancreatic ductal dilatation or pancreatic atrophy. Comparison to prior examinations is recommended.  If this 77 year old patient is a treatment candidate, further evaluation may be obtained with a pancreatic protocol MRI. Alternatively, a follow up examination may be performed in 6 months to ensure stability. 3.    Incidental note is made of a horseshoe kidney.  Original Report Authenticated By: Waynard Reeds, M.D.     4:14 PM:  Long d/w pt's daughter at bedside:  Pt does have hx confusion, +very HOH, and apparently has been c/o intermittent "palpitations" and "chest pain" for the past week to the [facility] staff, in addition to c/o her increasing bilat pedal edema.  Daughter also states that pt "has fallen a few times" and that may be where abd wall contusion is from.  BNP elevated today, no old BNP to compare.  Will dose IV lasix.  +UTI, UC pending.  Daughter states "they keep giving her abx for that and it's not going away."  Will dose IV rocephin.  Daughter thankful for admit today, states "no one has been paying attention to her  there."  T/C to [facility], case discussed, including:  HPI, pertinent PM/SHx, VS/PE, dx testing, ED course and treatment.  Agreeable to admit.  Will come to ED for eval.          [facility]   Laray Anger, DO 05/24/11 1117

## 2011-05-22 NOTE — ED Notes (Signed)
Pt presents to department via [facility] from [facility] for evaluation of atrial fibrillation and dizziness. Onset this morning. Denies chest pain. States SOB and lightheadedness. Pt is conscious alert and oriented x4. 22g L hand.

## 2011-05-22 NOTE — Progress Notes (Signed)
ANTICOAGULATION CONSULT NOTE - Initial Consult  Pharmacy Consult for coumadin/heparin Indication: afib  No Known Allergies  Patient Measurements: Ht: 5' 9'' Wt: Patient states last weight was around 170lbs but she has lost some weight recently and does not appear to be 170lbs (estimated wt ~155 lbs) Heparin dosing wt:= 70kg  Vital Signs: Temp: 98.1 F (36.7 C) (12/17 1622) Temp src: Oral (12/17 1622) BP: 155/69 mmHg (12/17 1622) Pulse Rate: 82  (12/17 1622)  Labs:  [facility] 05/22/11 0948  HGB 11.4*  HCT 35.2*  PLT 116*  APTT 30  LABPROT 14.4  INR 1.10  HEPARINUNFRC --  CREATININE 0.69  CKTOTAL --  CKMB --  TROPONINI --   CrCl~60  Medical History: Past Medical History  Diagnosis Date  . Atrial fibrillation   . Altered mental status   . Encephalopathy   . Parkinson disease   . Hypertension   . GERD (gastroesophageal reflux disease)   . Hyperlipemia   . Diabetes mellitus     Medications PTA Metformin, omeprazole, pravastatin, tramadol, trazodone, metoprolol, aspirin, plavix, lasix, gabapentin, lisinopril, patanol, systane, KCl  Assessment: 77 yo female with afib to start heparin and coumadin.  Patient noted with platelets at 116K and no other CBC available from past history.  Goal of Therapy:  INR 2-3 Heparin level 0.3-0.7 units/ml   Plan:  1) Heparin bolus of 3000 units followed by 850 units/hr (12 units/kg/hr) 2) Heparin level in 8 hrs and daily with CBC daily. 3) Coumadin 5mg  po today with daily PT/INRs 4) Will order coumadin booklet and video   Benny Lennert 05/22/2011,7:29 PM

## 2011-05-22 NOTE — ED Notes (Signed)
Pt resting quietly at the time. Vital signs stable. Denies pain. Family remains at bedside.

## 2011-05-22 NOTE — ED Notes (Signed)
Pt resting quietly at the time. Vital signs stable. Admitting physician at the bedside.

## 2011-05-22 NOTE — ED Notes (Signed)
Pt presents to department via [facility] from [facility] for evaluation of atrial fibrillation, SOB and dizziness. Onset this morning. Pt denies chest pain. Respirations unlabored. Pt also states "my stomach feels full." abdomen firm. Bowel sounds present all quadrants. Pt is alert and oriented x4 at the time. Large bruise noted to RLQ of abdomen. No signs of distress at the time.

## 2011-05-22 NOTE — ED Notes (Signed)
Patient transported to CT 

## 2011-05-22 NOTE — H&P (Signed)
Hospital Admission Note Date: 05/22/2011  Patient name: Maria Christian Medical record number: [id number] Date of birth: 1925/04/07 Age: 77 y.o. Gender: female PCP: Florentina Jenny, MD  Medical Service: Internal medicine teaching service - [facility]  Attending physician:  Dr. Luciana Axe   1st Contact: Dr. Bosie Clos    Pager: [phone] 2nd Contact: Dr. Arvilla Market    Pager: [phone] After 5 pm or weekends: 1st Contact:      Pager: [phone] 2nd Contact:      Pager: [phone]  Chief Complaint: lower extremity swelling, heart palpitations, chest/shoulder pain  History of Present Illness: Patient is an 77 y/o F with past medical history significant for atrial fibrillation, type 2 diabetes, and hypertension who presents with one to 2 weeks of heart palpitations. She reports these palpitations occur intermittently mainly throughout the day.  She denies any associated sob, doe, syncope, diaphoresis, or other complaint.  She report some pain primarily in her right shoulder but states this extends into her neck and occasionally across her chest; this is no exertional in nature.  She also reports increased LE edema; per daughter and patient, this has progressed over the past few months. She denies abdominal pain, chest pain, focal weakness/numbness/tingling, dysuria, hematuria, diarrhea, constipation, BRBPR/dark tarry stools, cough, or other complaint.    Meds: Aspirin 81 mg 1 tab by mouth daily Plavix 75 mg tabs 1 tab by mouth daily Lasix 20 mg tabs 1 tab by mouth daily Potassium 20 mEq tabs 1 tab by mouth daily Lisinopril 10 mg tabs 1 tab by mouth daily Omeprazole 20 mg tabs 1 tab by mouth daily Vitamin D tabs by mouth daily Gabapentin 100 mg caps 1 tab by mouth twice a day Metoprolol 50 mg tabs 1 tab by mouth twice a day Pravachol 20 mg tabs 1 tab by mouth daily Trazodone 50 mg tabs 2 tabs by mouth each bedtime when necessary insomnia Tramadol 50 mg tabs 1 tab by mouth every 6 when necessary  pain  Allergies: Review of patient's allergies indicates no known allergies. Past Medical History  Diagnosis Date  . Atrial fibrillation   . Parkinson disease   . Hypertension   . GERD (gastroesophageal reflux disease)   . Hyperlipemia   . Diabetes mellitus    No past surgical history on file. No family history on file. History   Social History  . Marital Status: Married    Spouse Name: N/A    Number of Children: N/A  . Years of Education: N/A   Social hx: pt recently moved from [facility] [place], PCP: Dr. Joseph Art) to [facility].  She has never smoked and denies EtOH and illicit drug use.  Review of Systems: Pertinent items are noted in HPI.  Physical Exam: Blood pressure 155/69, pulse 82, temperature 98.1 F (36.7 C), temperature source Oral, resp. rate 18, SpO2 99.00%. VItal signs reviewed and stable. GEN: No apparent distress.  Alert and oriented x 3.  Pleasant, conversant, and cooperative to exam. HEENT: head is normocephalic, abrasion noted on forehead that appears to be healing.  Neck is supple without palpable masses or lymphadenopathy.  No JVD or carotid bruits.  Vision intact.  EOMI.  PERRLA.  Sclerae anicteric.  Conjunctivae without pallor or injection. Mucous membranes are moist.  Oropharynx is without erythema, exudates, or other abnormal lesions.   RESP:  Lungs are clear to ascultation bilaterally with good air movement.  No wheezes, ronchi, or rubs.  Mild bibasilar crackles in LLL. CARDIOVASCULAR: regular  rate, irregular rhythm.  Clear S1, S2, no murmurs, gallops, or rubs. ABDOMEN: soft, non-tender, non-distended.  Bowels sounds present in all quadrants and normoactive.  No palpable masses. EXT: warm and dry.  Peripheral pulses equal, intact, and +2 globally.  No clubbing or cyanosis.  +1 pitting edema in bilateral lower extremities extending above knee, R>L. SKIN: warm and dry with normal turgor.  No rashes or abnormal  lesions observed. NEURO: CN II-XII grossly intact.  Muscle strength +5/5 in bilateral upper and lower extremities.  Sensation is grossly intact.  No focal deficit.   Lab results: Basic Metabolic Panel:  [facility] 05/22/11 0948  NA 139  K 3.8  CL 102  CO2 28  GLUCOSE 269*  BUN 10  CREATININE 0.69  CALCIUM 9.1  MG --  PHOS --   CBC:  Basename 05/22/11 0948  WBC 5.1  NEUTROABS 3.6  HGB 11.4*  HCT 35.2*  MCV 97.8  PLT 116*   Coagulation:  Basename 05/22/11 0948  LABPROT 14.4  INR 1.10     Imaging results:  Dg Chest 2 View  05/22/2011  *RADIOLOGY REPORT*  Clinical Data: Shortness of breath, diabetes, hypertension  CHEST - 2 VIEW  Comparison: 05/22/2011  Findings: Heart is enlarged with central vascular congestion and streaky basilar atelectasis / scarring.  No definite CHF, consolidation, pneumonia, large effusion or pneumothorax.  Several skin folds overlie the right lung.  Trachea midline. Atherosclerosis of the aorta and degenerative changes of the spine.  IMPRESSION: Cardiomegaly with vascular congestion and basilar atelectasis / scarring.  Previously described right mid lung ill-defined density, less apparent.  Suspect superimposed shadows.  Original Report Authenticated By: Judie Petit. Ruel Favors, M.D.   Dg Chest 2 View  05/22/2011  *RADIOLOGY REPORT*  Clinical Data: Palpitations.  History of atrial fibrillation. Hypertension and diabetes.  CHEST - 2 VIEW  Comparison: None.  Findings: There is cardiomegaly.  There are no edematous changes. There is vague density seen lateral to the right hilum which may represent a focal infiltrate or subtle mass.  Linear atelectasis/scarring is seen within the left lung base.  Recommend comparison with outside old films of these are available.  If these are unavailable, recommend follow-up chest x-ray in 2-3 weeks.  The central pulmonary arteries appear prominent bilaterally.  The osseous structures are unremarkable for this patient's age.   IMPRESSION:  1.  Cardiomegaly. 2.  Vague density lateral to the right hilum which may represent a mass or focal infiltrate. Recommend follow-up chest x-ray in 2-3 weeks.  3.  Linear atelectasis/scarring within the left base.  Original Report Authenticated By: Rolla Plate, M.D.   Ct Abdomen Pelvis W Contrast  05/22/2011  *RADIOLOGY REPORT*  Clinical Data: Large bruise involving the right lower abdominal quadrant.  CT ABDOMEN AND PELVIS WITH CONTRAST  Technique:  Multidetector CT imaging of the abdomen and pelvis was performed following the standard protocol during bolus administration of intravenous contrast.  Contrast:  100 ml Omnipaque-300  Comparison: None available  Findings:  Normal hepatic contour.  No discrete hepatic lesions.  Post cholecystectomy.  No intra or extrahepatic biliary ductal dilatation.  No ascites.  Incidental note is made of horseshoe kidney.  No discrete renal lesions.  No urinary obstruction.  Possible nonobstructing stone within the left kidney (image 38, series 2).  The bilateral adrenal glands are normal.  Normal appearance of the spleen.  There is an ill defined approximately 1.0 x 2.2 cm hypoattenuating lesion within the pancreatic body.  This lesion is suboptimally  evaluated secondary to phase of enhancement and the without associated pancreatic ductal dilatation or distal atrophy.  There is no definite involvement of the adjacent vasculature.  Enteric contrast extends to the descending colon.  Moderate gaseous distension of the colon without evidence of enteric obstruction. No pneumoperitoneum, pneumatosis or portal venous gas.  There is extensive atherosclerotic calcifications within a normal caliber abdominal aorta.  There are extensive atherosclerotic calcifications within the origin of the celiac and proximal aspect of the SMA, however the distal branches of the SMA appear patent. The IMA remains patent.  No retroperitoneal, mesenteric, pelvic or inguinal lymphadenopathy.   Post hysterectomy.  No free fluid within the pelvis.  Limited visualization of the lower thorax demonstrates small bilateral pleural effusions and bibasilar atelectasis. Cardiomegaly.  Calcifications within the mitral valve annulus.  No pericardial effusion.  No acute aggressive osseous abnormalities.  Lumbar spine degenerative change.  Post left total hip replacement, and poorly evaluated.  There is a minimal amount of stranding within the subcutaneous tissues of the right lower abdominal quadrant without radiopaque foreign body or definable fluid collection or hematoma.  IMPRESSION:     1.    Minimal amount of stranding within the subcutaneous tissues       of the right lower abdomen without radiopaque foreign body or       definable fluid collection or hematoma.        2.    Indeterminate approximately 2.2 cm hypoattenuating lesion within the pancreatic body, nonspecific, but may represent either a pseudocyst or small intraductal papillary mucinous neoplasm. This finding is not associated pancreatic ductal dilatation or pancreatic atrophy. Comparison to prior examinations is recommended.  If this 77 year old patient is a treatment candidate, further evaluation may be obtained with a pancreatic protocol MRI. Alternatively, a follow up examination may be performed in 6 months to ensure stability. 3.    Incidental note is made of a horseshoe kidney.  Original Report Authenticated By: Waynard Reeds, M.D.    Assessment & Plan by Problem: #Heart palpitations:  Unclear etiology.  May represent occasional episode of RVR or paroxysmal a fib.  Pt is currently asymptomatic and hemodynamically stable. - Admit to tele - further workup as outlined below  #Afib:  Patient currently in A. fib but rate controlled.  She is not currently anticoagulated, though her CHADSVASC2 score was 5 and she would arguably benefit from Coumadin.  Neither the patient nor her daughter are aware of any contraindications or other reason to  avoid coumadin. - Continue metoprolol - Start heparin gtt to bridge until INR at goal on coumadin - Coumadin per pharmacy - f/u records from PCP - check TSH  #Shoulder/chest discomfort: This seems musculoskeletal in nature based on patient's description of symptoms and exam findings.  EKG is without evidence of ischemia or other concerning coronary process, and the first set of cardiac enzymes is negative.  Given her risk factors, it is reasonable to proceed with full evaluation for ACS.  Patient does not have any hx of CVA/TIA or CAD; there do not currently appear to be any indications for Plavix. - 12-lead EKG in the morning - Cycle cardiac enzymes - defer Plavix until records obtained - fasting lipids in am - continue statin and bet-blocker  #LE edema: a large degree of her LE swelling is likely dependant edema.  She may have some degree of CHF however cannot make that diagnosis at this time without old records and/or results of echocardiogram; per family, she  does not have any prior hx of CAD or CHF.  Her right lower extremity is significantly more swollen than the left but reassuringly is without erythema, induration, or tenderness with palpation; Homen's and Moses sign negative.  Her renal function is within normal limits. - Compression stockings - LE dopplers to assess for DVT - Monitor strict I/Os and daily weights - 2D echocardiogram to assess LV function  #Pyuria: UA reveals large leukocytes with too numerous to count white blood cells and few squamous cells.  Pt was given one dose of Rocephin in the emergency room. We'll not continue this as she is currently asymptomatic there is no indication to treat asymptomatic pyuria. - Will followup results of urine culture  #DM2:  Will hold metformin in setting of contrast administered for contrasted CT of the abdomen obtained in the ER after discovery of a hematoma on EDP exam. - check A1c -  SSI  HTN: stable - continue home  meds  GERD - protonix  DVT ppx: pt will be on full dose heparin  Signed: Havanah Nelms 05/22/2011, 6:56 PM

## 2011-05-22 NOTE — ED Notes (Signed)
Pt resting quietly at the time. Remains on cardiac monitor. Vital signs stable. Family at bedside. 

## 2011-05-22 NOTE — ED Notes (Signed)
ORDERED DINNER TRAY  

## 2011-05-23 ENCOUNTER — Encounter (HOSPITAL_COMMUNITY): Payer: Self-pay | Admitting: Nurse Practitioner

## 2011-05-23 ENCOUNTER — Other Ambulatory Visit: Payer: Self-pay

## 2011-05-23 DIAGNOSIS — R002 Palpitations: Secondary | ICD-10-CM

## 2011-05-23 DIAGNOSIS — M79609 Pain in unspecified limb: Secondary | ICD-10-CM

## 2011-05-23 DIAGNOSIS — I059 Rheumatic mitral valve disease, unspecified: Secondary | ICD-10-CM

## 2011-05-23 DIAGNOSIS — M7989 Other specified soft tissue disorders: Secondary | ICD-10-CM

## 2011-05-23 LAB — GLUCOSE, CAPILLARY
Glucose-Capillary: 208 mg/dL — ABNORMAL HIGH (ref 70–99)
Glucose-Capillary: 158 mg/dL — ABNORMAL HIGH (ref 70–99)
Glucose-Capillary: 166 mg/dL — ABNORMAL HIGH (ref 70–99)
Glucose-Capillary: 209 mg/dL — ABNORMAL HIGH (ref 70–99)

## 2011-05-23 LAB — CARDIAC PANEL(CRET KIN+CKTOT+MB+TROPI)
CK, MB: 2.1 ng/mL (ref 0.3–4.0)
Relative Index: INVALID (ref 0.0–2.5)
Total CK: 32 U/L (ref 7–177)
Total CK: 30 U/L (ref 7–177)
Total CK: 34 U/L (ref 7–177)
Troponin I: <0.3 ng/mL (ref <0.30)

## 2011-05-23 LAB — HEMOGLOBIN A1C
Hgb A1c MFr Bld: 7.7 % — ABNORMAL HIGH (ref <5.7)
Mean Plasma Glucose: 174 mg/dL — ABNORMAL HIGH (ref <117)

## 2011-05-23 LAB — LIPID PANEL
Cholesterol: 109 mg/dL (ref 0–200)
Total CHOL/HDL Ratio: 2.3 RATIO

## 2011-05-23 LAB — CBC
MCV: 96.5 fL (ref 78.0–100.0)
Platelets: 136 10*3/uL — ABNORMAL LOW (ref 150–400)
RDW: 14.3 % (ref 11.5–15.5)
WBC: 4.9 10*3/uL (ref 4.0–10.5)

## 2011-05-23 LAB — PROTIME-INR
INR: 1.2 (ref 0.00–1.49)
Prothrombin Time: 15.5 seconds — ABNORMAL HIGH (ref 11.6–15.2)

## 2011-05-23 MED ORDER — WARFARIN SODIUM 5 MG PO TABS
5.0000 mg | ORAL_TABLET | Freq: Once | ORAL | Status: DC
Start: 2011-05-23 — End: 2011-05-23
  Filled 2011-05-23: qty 1

## 2011-05-23 MED ORDER — METOPROLOL TARTRATE 12.5 MG HALF TABLET
12.5000 mg | ORAL_TABLET | Freq: Two times a day (BID) | ORAL | Status: DC
  Filled 2011-05-23: qty 1

## 2011-05-23 MED ORDER — SENNA 8.6 MG PO TABS: 1.0000 | ORAL_TABLET | Freq: Every day | ORAL | Status: DC

## 2011-05-23 MED ORDER — METOPROLOL TARTRATE 25 MG PO TABS
25.0000 mg | ORAL_TABLET | Freq: Two times a day (BID) | ORAL | Status: DC
  Administered 2011-05-23 – 2011-05-24 (×2): 25 mg via ORAL
  Filled 2011-05-23 (×3): qty 1

## 2011-05-23 MED ORDER — METOPROLOL TARTRATE 25 MG PO TABS
25.0000 mg | ORAL_TABLET | Freq: Two times a day (BID) | ORAL | Status: DC
  Filled 2011-05-23: qty 1

## 2011-05-23 MED ORDER — DOCUSATE SODIUM 100 MG PO CAPS: 100.0000 mg | ORAL_CAPSULE | Freq: Every day | ORAL | Status: DC

## 2011-05-23 MED ORDER — DOCUSATE SODIUM 100 MG PO CAPS
100.0000 mg | ORAL_CAPSULE | Freq: Every day | ORAL | Status: DC | PRN
  Filled 2011-05-23 (×2): qty 1

## 2011-05-23 MED ORDER — SENNA 8.6 MG PO TABS
1.0000 | ORAL_TABLET | Freq: Every day | ORAL | Status: DC | PRN
  Filled 2011-05-23: qty 1

## 2011-05-23 NOTE — H&P (Signed)
Internal Medicine Teaching Service Attending Note Date: 05/23/2011  Patient name: Maria Christian  Medical record number: [id number]  Date of birth: [birth year]   I have seen and evaluated Maria Christian and discussed their care with the Residency Team.  77 yo new to the area brought here by family with concern for palpitations and not being happy with current assisted living situation.  No other associated complaints.  Some shoulder pain.  Has fallen at previous facilities.  Patient is on multiple meds but the family is unclear of why.    Physical Exam: Blood pressure 121/69, pulse 61, temperature 98.1 F (36.7 C), temperature source Oral, resp. rate 20, height 5\' 9"  (1.753 m), weight 167 lb 5.3 oz (75.9 kg), SpO2 97.00%. BP 121/69  Pulse 61  Temp(Src) 98.1 F (36.7 C) (Oral)  Resp 20  Ht 5\' 9"  (1.753 m)  Wt 167 lb 5.3 oz (75.9 kg)  BMI 24.71 kg/m2  SpO2 97% General appearance: alert, cooperative and no distress Lungs: clear to auscultation bilaterally Heart: irregularly irregular rhythm  Lab results: Results for orders placed during the hospital encounter of 05/22/11 (from the past 24 hour(s))  HEMOGLOBIN A1C     Status: Abnormal   Collection Time   05/22/11  9:19 PM      Component Value Range   Hemoglobin A1C 7.7 (*) <5.7 (%)   Mean Plasma Glucose 174 (*) <117 (mg/dL)  CBC     Status: Abnormal   Collection Time   05/22/11  9:19 PM      Component Value Range   WBC 5.9  4.0 - 10.5 (K/uL)   RBC 3.66 (*) 3.87 - 5.11 (MIL/uL)   Hemoglobin 11.9 (*) 12.0 - 15.0 (g/dL)   HCT 40.9 (*) 81.1 - 46.0 (%)   MCV 96.4  78.0 - 100.0 (fL)   MCH 32.5  26.0 - 34.0 (pg)   MCHC 33.7  30.0 - 36.0 (g/dL)   RDW 91.4  78.2 - 95.6 (%)   Platelets 143 (*) 150 - 400 (K/uL)  CREATININE, SERUM     Status: Abnormal   Collection Time   05/22/11  9:19 PM      Component Value Range   Creatinine, Ser 0.68  0.50 - 1.10 (mg/dL)   GFR calc non Af Amer 78 (*) >90 (mL/min)   GFR calc Af Amer >90  >90  (mL/min)  TSH     Status: Normal   Collection Time   05/22/11  9:19 PM      Component Value Range   TSH 1.772  0.350 - 4.500 (uIU/mL)  RPR     Status: Normal   Collection Time   05/22/11  9:19 PM      Component Value Range   RPR NON REACTIVE  NON REACTIVE   CARDIAC PANEL(CRET KIN+CKTOT+MB+TROPI)     Status: Normal   Collection Time   05/22/11  9:20 PM      Component Value Range   Total CK 32  7 - 177 (U/L)   CK, MB 2.1  0.3 - 4.0 (ng/mL)   Troponin I <0.30  <0.30 (ng/mL)   Relative Index RELATIVE INDEX IS INVALID  0.0 - 2.5   GLUCOSE, CAPILLARY     Status: Abnormal   Collection Time   05/22/11  9:20 PM      Component Value Range   Glucose-Capillary 208 (*) 70 - 99 (mg/dL)   Comment 1 Notify RN    GLUCOSE, CAPILLARY     Status:  Abnormal   Collection Time   05/22/11 11:50 PM      Component Value Range   Glucose-Capillary 200 (*) 70 - 99 (mg/dL)  MRSA PCR SCREENING     Status: Normal   Collection Time   05/23/11 12:32 AM      Component Value Range   MRSA by PCR NEGATIVE  NEGATIVE   GLUCOSE, CAPILLARY     Status: Abnormal   Collection Time   05/23/11  4:10 AM      Component Value Range   Glucose-Capillary 158 (*) 70 - 99 (mg/dL)  CBC     Status: Abnormal   Collection Time   05/23/11  6:30 AM      Component Value Range   WBC 4.9  4.0 - 10.5 (K/uL)   RBC 3.39 (*) 3.87 - 5.11 (MIL/uL)   Hemoglobin 10.5 (*) 12.0 - 15.0 (g/dL)   HCT 40.9 (*) 81.1 - 46.0 (%)   MCV 96.5  78.0 - 100.0 (fL)   MCH 31.0  26.0 - 34.0 (pg)   MCHC 32.1  30.0 - 36.0 (g/dL)   RDW 91.4  78.2 - 95.6 (%)   Platelets 136 (*) 150 - 400 (K/uL)  CARDIAC PANEL(CRET KIN+CKTOT+MB+TROPI)     Status: Normal   Collection Time   05/23/11  6:30 AM      Component Value Range   Total CK 30  7 - 177 (U/L)   CK, MB 2.0  0.3 - 4.0 (ng/mL)   Troponin I <0.30  <0.30 (ng/mL)   Relative Index RELATIVE INDEX IS INVALID  0.0 - 2.5   LIPID PANEL     Status: Normal   Collection Time   05/23/11  6:30 AM      Component  Value Range   Cholesterol 109  0 - 200 (mg/dL)   Triglycerides 53  <213 (mg/dL)   HDL 47  >08 (mg/dL)   Total CHOL/HDL Ratio 2.3     VLDL 11  0 - 40 (mg/dL)   LDL Cholesterol 51  0 - 99 (mg/dL)  HEPARIN LEVEL (UNFRACTIONATED)     Status: Normal   Collection Time   05/23/11  6:30 AM      Component Value Range   Heparin Unfractionated 0.30  0.30 - 0.70 (IU/mL)  PROTIME-INR     Status: Abnormal   Collection Time   05/23/11  6:30 AM      Component Value Range   Prothrombin Time 15.5 (*) 11.6 - 15.2 (seconds)   INR 1.20  0.00 - 1.49   GLUCOSE, CAPILLARY     Status: Abnormal   Collection Time   05/23/11  7:45 AM      Component Value Range   Glucose-Capillary 150 (*) 70 - 99 (mg/dL)   Comment 1 Notify RN    GLUCOSE, CAPILLARY     Status: Abnormal   Collection Time   05/23/11 11:00 AM      Component Value Range   Glucose-Capillary 166 (*) 70 - 99 (mg/dL)  CARDIAC PANEL(CRET KIN+CKTOT+MB+TROPI)     Status: Normal (Preliminary result)   Collection Time   05/23/11 12:35 PM      Component Value Range   Total CK PENDING  7 - 177 (U/L)   CK, MB 2.1  0.3 - 4.0 (ng/mL)   Troponin I <0.30  <0.30 (ng/mL)   Relative Index PENDING  0.0 - 2.5     Imaging results:  Dg Chest 2 View  05/22/2011  *RADIOLOGY REPORT*  Clinical Data:  Shortness of breath, diabetes, hypertension  CHEST - 2 VIEW  Comparison: 05/22/2011  Findings: Heart is enlarged with central vascular congestion and streaky basilar atelectasis / scarring.  No definite CHF, consolidation, pneumonia, large effusion or pneumothorax.  Several skin folds overlie the right lung.  Trachea midline. Atherosclerosis of the aorta and degenerative changes of the spine.  IMPRESSION: Cardiomegaly with vascular congestion and basilar atelectasis / scarring.  Previously described right mid lung ill-defined density, less apparent.  Suspect superimposed shadows.  Original Report Authenticated By: Judie Petit. Ruel Favors, M.D.   Dg Chest 2 View  05/22/2011   *RADIOLOGY REPORT*  Clinical Data: Palpitations.  History of atrial fibrillation. Hypertension and diabetes.  CHEST - 2 VIEW  Comparison: None.  Findings: There is cardiomegaly.  There are no edematous changes. There is vague density seen lateral to the right hilum which may represent a focal infiltrate or subtle mass.  Linear atelectasis/scarring is seen within the left lung base.  Recommend comparison with outside old films of these are available.  If these are unavailable, recommend follow-up chest x-ray in 2-3 weeks.  The central pulmonary arteries appear prominent bilaterally.  The osseous structures are unremarkable for this patient's age.  IMPRESSION:  1.  Cardiomegaly. 2.  Vague density lateral to the right hilum which may represent a mass or focal infiltrate. Recommend follow-up chest x-ray in 2-3 weeks.  3.  Linear atelectasis/scarring within the left base.  Original Report Authenticated By: Rolla Plate, M.D.   Ct Abdomen Pelvis W Contrast  05/22/2011  *RADIOLOGY REPORT*  Clinical Data: Large bruise involving the right lower abdominal quadrant.  CT ABDOMEN AND PELVIS WITH CONTRAST  Technique:  Multidetector CT imaging of the abdomen and pelvis was performed following the standard protocol during bolus administration of intravenous contrast.  Contrast:  100 ml Omnipaque-300  Comparison: None available  Findings:  Normal hepatic contour.  No discrete hepatic lesions.  Post cholecystectomy.  No intra or extrahepatic biliary ductal dilatation.  No ascites.  Incidental note is made of horseshoe kidney.  No discrete renal lesions.  No urinary obstruction.  Possible nonobstructing stone within the left kidney (image 38, series 2).  The bilateral adrenal glands are normal.  Normal appearance of the spleen.  There is an ill defined approximately 1.0 x 2.2 cm hypoattenuating lesion within the pancreatic body.  This lesion is suboptimally evaluated secondary to phase of enhancement and the without associated  pancreatic ductal dilatation or distal atrophy.  There is no definite involvement of the adjacent vasculature.  Enteric contrast extends to the descending colon.  Moderate gaseous distension of the colon without evidence of enteric obstruction. No pneumoperitoneum, pneumatosis or portal venous gas.  There is extensive atherosclerotic calcifications within a normal caliber abdominal aorta.  There are extensive atherosclerotic calcifications within the origin of the celiac and proximal aspect of the SMA, however the distal branches of the SMA appear patent. The IMA remains patent.  No retroperitoneal, mesenteric, pelvic or inguinal lymphadenopathy.  Post hysterectomy.  No free fluid within the pelvis.  Limited visualization of the lower thorax demonstrates small bilateral pleural effusions and bibasilar atelectasis. Cardiomegaly.  Calcifications within the mitral valve annulus.  No pericardial effusion.  No acute aggressive osseous abnormalities.  Lumbar spine degenerative change.  Post left total hip replacement, and poorly evaluated.  There is a minimal amount of stranding within the subcutaneous tissues of the right lower abdominal quadrant without radiopaque foreign body or definable fluid collection or hematoma.  IMPRESSION:  1.    Minimal amount of stranding within the subcutaneous tissues       of the right lower abdomen without radiopaque foreign body or       definable fluid collection or hematoma.        2.    Indeterminate approximately 2.2 cm hypoattenuating lesion within the pancreatic body, nonspecific, but may represent either a pseudocyst or small intraductal papillary mucinous neoplasm. This finding is not associated pancreatic ductal dilatation or pancreatic atrophy. Comparison to prior examinations is recommended.  If this 77 year old patient is a treatment candidate, further evaluation may be obtained with a pancreatic protocol MRI. Alternatively, a follow up examination may be performed in 6  months to ensure stability. 3.    Incidental note is made of a horseshoe kidney.  Original Report Authenticated By: Waynard Reeds, M.D.    Assessment and Plan: I agree with the formulated Assessment and Plan with the following changes: patient had some palpitations and observed overnight.  She also is on multiple medicines, though it is unclear why she is on some of them.  She does have afib, though is only rate controlled.  She is on lasix and CXR does show cardiomegaly but no known history by the family.      She is going to get information from CM for new possible facilities.  At this time her aspirin will be discontinued and she will continue with Plavix since I see no indication for dual therapy which will increase the risk of bleeding with little benefit.  She also will get set up with a new PCP and cardiologist.  She will continue rate control for Afib and will defer consideration of coumadin to the cardiologist and I do not feel she needs heparin or therapy with coumadin at this time since she is a fall risk.  Lasix also will be held pending her echo result.

## 2011-05-23 NOTE — Progress Notes (Signed)
Clinical Social Work-CSW completed full assessment-please see shadow chart-Pt, family, POA all agreeable to return to ALF, CSW provided pt and family with contact information to report fall at ALF. CSW contacted PT w/ regards to recommendation of returning to SNF-per PT pt appropriate to return to same level of care (ALF). PT recommendation based on inappropriate level of care documented in initial H&P. CSW discussed with MD and will follow for d/c planning. Jodean Lima, [phone]

## 2011-05-23 NOTE — Progress Notes (Signed)
Pts. Heart rate has been trending down since midnight meds given.  Pts. Heart rate was a-fib 90s and is now sustaining a-fib in 50s-60s occasionally down to 44 not sustained.  Pt. Asymptomatic.  BP 106/60.  Patient has been awake on and off and HR sustaining in 60s while awake.  Dr. Manson Passey made aware.  Will continue to monitor patient.  Bartholomew Boards

## 2011-05-23 NOTE — Progress Notes (Signed)
05/23/2011 3:50 PM Medical record request sent to rehab facility per MD order. Per rehab facility, records will be here towards the end of the week.  Eunice Blase

## 2011-05-23 NOTE — Progress Notes (Signed)
Subjective: Continued rate controlled afib with an episode of low heart rate overnight down to 44bpm, pt remained asymptomatic  Objective: Vital signs in last 24 hours: Filed Vitals:   05/22/11 2126 05/23/11 0100 05/23/11 0605 05/23/11 1434  BP: 164/85 106/60 121/69 128/74  Pulse: 90 56 61 64  Temp: 98.5 F (36.9 C) 98.2 F (36.8 C) 98.1 F (36.7 C) 98.6 F (37 C)  TempSrc:    Oral  Resp: 20 20 20 18   Height: 5\' 9"  (1.753 m)     Weight: 167 lb 5.3 oz (75.9 kg)  167 lb 5.3 oz (75.9 kg)   SpO2: 96% 95% 97% 97%    Intake/Output Summary (Last 24 hours) at 05/23/11 1536 Last data filed at 05/23/11 1515  Gross per 24 hour  Intake 739.38 ml  Output      0 ml  Net 739.38 ml   Physical Exam: GEN: No apparent distress. Alert and oriented x 3. Pleasant, conversant, and cooperative to exam.  RESP: Lungs are clear to ascultation bilaterally. CARDIOVASCULAR: regular rate, irregular rhythm. Clear S1,S2 ABDOMEN: soft, non-tender, non-distended. Bowels sounds present in all quadrants and normoactive. No palpable masses.  EXT: warm and dry. Peripheral pulses equal, intact, and +2 globally. No clubbing or cyanosis. +1 pitting edema in bilateral lower extremities extending above knee, R>L.  SKIN: warm and dry with normal turgor. No rashes or abnormal lesions observed.   Lab Results: Basic Metabolic Panel:  Lab 05/22/11 8119 05/22/11 0948  NA -- 139  K -- 3.8  CL -- 102  CO2 -- 28  GLUCOSE -- 269*  BUN -- 10  CREATININE 0.68 0.69  CALCIUM -- 9.1  MG -- --  PHOS -- --    CBC:  Lab 05/23/11 0630 05/22/11 2119 05/22/11 0948  WBC 4.9 5.9 --  NEUTROABS -- -- 3.6  HGB 10.5* 11.9* --  HCT 32.7* 35.3* --  MCV 96.5 96.4 --  PLT 136* 143* --   Cardiac Enzymes:  Lab 05/23/11 1235 05/23/11 0630 05/22/11 2120  CKTOTAL 34 30 32  CKMB 2.1 2.0 2.1  CKMBINDEX -- -- --  TROPONINI <0.30 <0.30 <0.30    CBG:  Lab 05/23/11 1100 05/23/11 0745 05/23/11 0410 05/22/11 2350 05/22/11 2120    GLUCAP 166* 150* 158* 200* 208*   Hemoglobin A1C:  Lab 05/22/11 2119  HGBA1C 7.7*   Fasting Lipid Panel:  Lab 05/23/11 0630  CHOL 109  HDL 47  LDLCALC 51  TRIG 53  CHOLHDL 2.3  LDLDIRECT --   Thyroid Function Tests:  Lab 05/22/11 2119  TSH 1.772  T4TOTAL --  FREET4 --  T3FREE --  THYROIDAB --   Coagulation:  Lab 05/23/11 0630 05/22/11 0948  LABPROT 15.5* 14.4  INR 1.20 1.10    Micro Results: Recent Results (from the past 240 hour(s))  MRSA PCR SCREENING     Status: Normal   Collection Time   05/23/11 12:32 AM      Component Value Range Status Comment   MRSA by PCR NEGATIVE  NEGATIVE  Final    Studies/Results: Dg Chest 2 View  05/22/2011  *RADIOLOGY REPORT*  Clinical Data: Shortness of breath, diabetes, hypertension  CHEST - 2 VIEW  Comparison: 05/22/2011  Findings: Heart is enlarged with central vascular congestion and streaky basilar atelectasis / scarring.  No definite CHF, consolidation, pneumonia, large effusion or pneumothorax.  Several skin folds overlie the right lung.  Trachea midline. Atherosclerosis of the aorta and degenerative changes of the spine.  IMPRESSION:  Cardiomegaly with vascular congestion and basilar atelectasis / scarring.  Previously described right mid lung ill-defined density, less apparent.  Suspect superimposed shadows.  Original Report Authenticated By: Judie Petit. Ruel Favors, M.D.   Dg Chest 2 View  05/22/2011  *RADIOLOGY REPORT*  Clinical Data: Palpitations.  History of atrial fibrillation. Hypertension and diabetes.  CHEST - 2 VIEW  Comparison: None.  Findings: There is cardiomegaly.  There are no edematous changes. There is vague density seen lateral to the right hilum which may represent a focal infiltrate or subtle mass.  Linear atelectasis/scarring is seen within the left lung base.  Recommend comparison with outside old films of these are available.  If these are unavailable, recommend follow-up chest x-ray in 2-3 weeks.  The central  pulmonary arteries appear prominent bilaterally.  The osseous structures are unremarkable for this patient's age.  IMPRESSION:  1.  Cardiomegaly. 2.  Vague density lateral to the right hilum which may represent a mass or focal infiltrate. Recommend follow-up chest x-ray in 2-3 weeks.  3.  Linear atelectasis/scarring within the left base.  Original Report Authenticated By: Rolla Plate, M.D.   Ct Abdomen Pelvis W Contrast  05/22/2011  *RADIOLOGY REPORT*  Clinical Data: Large bruise involving the right lower abdominal quadrant.  CT ABDOMEN AND PELVIS WITH CONTRAST  Technique:  Multidetector CT imaging of the abdomen and pelvis was performed following the standard protocol during bolus administration of intravenous contrast.  Contrast:  100 ml Omnipaque-300  Comparison: None available  Findings:  Normal hepatic contour.  No discrete hepatic lesions.  Post cholecystectomy.  No intra or extrahepatic biliary ductal dilatation.  No ascites.  Incidental note is made of horseshoe kidney.  No discrete renal lesions.  No urinary obstruction.  Possible nonobstructing stone within the left kidney (image 38, series 2).  The bilateral adrenal glands are normal.  Normal appearance of the spleen.  There is an ill defined approximately 1.0 x 2.2 cm hypoattenuating lesion within the pancreatic body.  This lesion is suboptimally evaluated secondary to phase of enhancement and the without associated pancreatic ductal dilatation or distal atrophy.  There is no definite involvement of the adjacent vasculature.  Enteric contrast extends to the descending colon.  Moderate gaseous distension of the colon without evidence of enteric obstruction. No pneumoperitoneum, pneumatosis or portal venous gas.  There is extensive atherosclerotic calcifications within a normal caliber abdominal aorta.  There are extensive atherosclerotic calcifications within the origin of the celiac and proximal aspect of the SMA, however the distal branches of  the SMA appear patent. The IMA remains patent.  No retroperitoneal, mesenteric, pelvic or inguinal lymphadenopathy.  Post hysterectomy.  No free fluid within the pelvis.  Limited visualization of the lower thorax demonstrates small bilateral pleural effusions and bibasilar atelectasis. Cardiomegaly.  Calcifications within the mitral valve annulus.  No pericardial effusion.  No acute aggressive osseous abnormalities.  Lumbar spine degenerative change.  Post left total hip replacement, and poorly evaluated.  There is a minimal amount of stranding within the subcutaneous tissues of the right lower abdominal quadrant without radiopaque foreign body or definable fluid collection or hematoma.  IMPRESSION:     1.    Minimal amount of stranding within the subcutaneous tissues       of the right lower abdomen without radiopaque foreign body or       definable fluid collection or hematoma.        2.    Indeterminate approximately 2.2 cm hypoattenuating lesion within the pancreatic body,  nonspecific, but may represent either a pseudocyst or small intraductal papillary mucinous neoplasm. This finding is not associated pancreatic ductal dilatation or pancreatic atrophy. Comparison to prior examinations is recommended.  If this 77 year old patient is a treatment candidate, further evaluation may be obtained with a pancreatic protocol MRI. Alternatively, a follow up examination may be performed in 6 months to ensure stability. 3.    Incidental note is made of a horseshoe kidney.  Original Report Authenticated By: Waynard Reeds, M.D.   Medications: I have reviewed the patient's current medications. Scheduled Meds:   . cefTRIAXone (ROCEPHIN)  IV  1 g Intravenous Once  . clopidogrel  75 mg Oral Daily  . dorzolamide  1 drop Both Eyes TID  . furosemide  40 mg Intravenous Once  . gabapentin  100 mg Oral BID  . heparin  3,000 Units Intravenous Once  . insulin aspart  0-9 Units Subcutaneous Q4H  . lisinopril  10 mg Oral  Daily  . metoprolol  50 mg Oral BID  . olopatadine  1 drop Both Eyes Daily  . pantoprazole  40 mg Oral Q1200  . patient's guide to using coumadin book   Does not apply Once  . polyvinyl alcohol  1 drop Both Eyes QID  . simvastatin  10 mg Oral q1800  . sodium chloride  3 mL Intravenous Q12H  . Travoprost (BAK Free)  1 drop Both Eyes QHS  . traZODone  100 mg Oral QHS  . warfarin  5 mg Oral NOW  . DISCONTD: docusate sodium  100 mg Oral Daily  . DISCONTD: Polyethyl Glycol-Propyl Glycol  1 drop Ophthalmic QID  . DISCONTD: polyvinyl alcohol  1 drop Both Eyes Daily  . DISCONTD: senna  1 tablet Oral Daily  . DISCONTD: warfarin  5 mg Oral ONCE-1800  . DISCONTD: warfarin   Does not apply Once   Continuous Infusions:   . heparin 850 Units/hr (05/22/11 2014)  . DISCONTD: sodium chloride 50 mL/hr at 05/22/11 2014   PRN Meds:.acetaminophen, acetaminophen, docusate sodium, senna, DISCONTD: iohexol Assessment/Plan:  77 y/o F with past medical history significant for rate-controlled atrial fibrillation, type 2 diabetes, hypertension and recent pneumonia admitted with one to 2 weeks of heart palpitations from ALF.  #1 Atrial Fibrillation/Palpitations:  rate controlled with metoprolol 50 mg po bid. Patient experienced episodes of asymptomatic bradycardia (36 bpm and 48 bpm). She is not currently anticoagulated, though her CHADSVASC2 score was 5 and she would arguably benefit from Coumadin. Given patients fall risks will defer addition of anticoagulation with coumadin at this time and defer to cardiologist. TSH within normal limits. - Decrease metoprolol to 12.5mg  po bid -continue to monitor with telemetry -f/u with new cardiologist Dr. Daleen Squibb per daughter's request 06/14/11  #2 Shoulder/chest discomfort: likely musculoskeletal in nature based on patient's description of symptoms and exam findings. EKG is without evidence of ischemia or other concerning coronary process, and cardiac enzymes are negative.  Daughter reports that patient has been on Plavix since 2002 for "plaques in her arteries" but denies coronary stents or h/o CVA. Lipid Panel demonstrates adequate control on statin therapy. - will continue to monitor -Tylenol for prn pain  #3 LE edema: likely dependent edema, LE dopplers negative for DVT - Continue compression stockings  - continue Monitor strict I/Os and daily weights  - 2D echocardiogram to assess LV function pending  #4 DM2: HemoglobinA1c 7.7, holding metformin secondary to contrast administered for contrasted CT of the abdomen obtained per ED after discovery  of a asymptomatic hematoma on physical exam - will continue SSI and continue to monitor -f/u with her new PCP Dr. Jonny Ruiz for further management  #5 HTN: stable and controlled with home regimen of lisinopril and metoprolol - continue lisinopril 10 mg qd and decrease metoprolol from 50 mg bid to 25mg  bid given bradycardic episodes  #6 Disposition: patient to return to Assisted Living Facility, daughter Georgian Co updated and clarified of patient's status, she reports being informed by ED physicians that patient had "fluid on her heart", son in [place] currently and has FPOA, messages left for him updating pt's status aw well    LOS: 1 day   Omarri Eich 05/23/2011, 3:36 PM

## 2011-05-23 NOTE — Progress Notes (Addendum)
Physical Therapy Evaluation Patient Details Name: Maria Christian MRN: [id number] DOB: [birth year] Today's Date: 05/23/2011  Problem List: There is no problem list on file for this patient.   Past Medical History:  Past Medical History  Diagnosis Date  . Atrial fibrillation   . Altered mental status   . Encephalopathy   . Parkinson disease   . Hypertension   . GERD (gastroesophageal reflux disease)   . Hyperlipemia   . Diabetes mellitus   . Coronary artery disease    Past Surgical History: History reviewed. No pertinent past surgical history.  PT Assessment/Plan/Recommendation PT Assessment Clinical Impression Statement: Pt is 77 y/o female admitted for difficulty breathing, UTI, and palpitations.  Pt currently resides at nursing home.  Pt will benefit from acute PT services to improve overall strength and endurance to prepare for safe d/c to next venue of care. PT Recommendation/Assessment: Patient will need skilled PT in the acute care venue PT Problem List: Decreased strength;Decreased range of motion;Decreased activity tolerance;Decreased balance;Decreased mobility;Decreased knowledge of use of DME PT Therapy Diagnosis : Generalized weakness PT Plan PT Frequency: Min 2X/week PT Treatment/Interventions: DME instruction;Functional mobility training;Therapeutic activities;Therapeutic exercise;Balance training;Patient/family education PT Recommendation Follow Up Recommendations: Skilled nursing facility;Other (comment) (return to nursing home) Equipment Recommended: None recommended by PT Per SW pt currently lives at ALF.  Pt safe to return ALF if they continue to provide assistance needed to perform transfer.  Per pt, pt has not been ambulatory for a while and needed assist to transfer to [facility].  Question if pt is close to baseline however will continue to assess. PT Goals  Acute Rehab PT Goals PT Goal Formulation: With patient Time For Goal Achievement: 2 weeks Pt will Roll  Supine to Left Side: with min assist PT Goal: Rolling Supine to Left Side - Progress: Progressing toward goal Pt will go Supine/Side to Sit: with min assist PT Goal: Supine/Side to Sit - Progress: Progressing toward goal Pt will go Sit to Supine/Side: with min assist PT Goal: Sit to Supine/Side - Progress: Progressing toward goal Pt will go Sit to Stand: with min assist PT Goal: Sit to Stand - Progress: Progressing toward goal Pt will go Stand to Sit: with min assist PT Goal: Stand to Sit - Progress: Progressing toward goal Pt will Transfer Bed to Chair/Chair to Bed: with min assist PT Transfer Goal: Bed to Chair/Chair to Bed - Progress: Progressing toward goal  PT Evaluation Precautions/Restrictions  Precautions Precautions: Fall Required Braces or Orthoses: No Restrictions Weight Bearing Restrictions: No Prior Functioning  Home Living Lives With: Other (Comment) (Nursing Home) Type of Home: Skilled Nursing Facility Home Access: Level entry Prior Function Level of Independence: Needs assistance with tranfers;Other (comment) (Pt unable to ambulate and only performs transfers) Driving: No Cognition Cognition Arousal/Alertness: Awake/alert Overall Cognitive Status: Appears within functional limits for tasks assessed Orientation Level: Oriented to person;Oriented to situation;Disoriented to place;Disoriented to time Sensation/Coordination   Extremity Assessment RLE Assessment RLE Assessment: Exceptions to [facility] RLE Strength RLE Overall Strength: Unable to assess;Due to pain RLE Overall Strength Comments: At least 3/5 gross but when attempt to MMT pt c/o pain and unable to maintain against gravity. LLE Assessment LLE Assessment: Exceptions to [facility] LLE Strength LLE Overall Strength: Unable to assess;Due to pain LLE Overall Strength Comments: At least 3/5 gross with bed mobility Mobility (including Balance) Bed Mobility Bed Mobility: Yes Rolling Left: 3: Mod assist;With  rail Rolling Left Details (indicate cue type and reason): (A) to initiate roll and complete  roll.  VCs for hand placement and proper technique' Left Sidelying to Sit: 3: Mod assist;With rails;HOB flat Left Sidelying to Sit Details (indicate cue type and reason): (A) to elevate trunk OOB and LE OOB.  Cues for technique and hand placement.  Transfers Transfers: Yes Sit to Stand: 1: +2 Total assist;Patient percentage (comment);From elevated surface;From bed Sit to Stand Details (indicate cue type and reason): (A) to initiate transfer with cues for hand and LE placement.  Pt uses LE against bed to initiate transfer and maintain balance. Stand to Sit: 1: +2 Total assist;Patient percentage (comment);To chair/3-in-1 (pt 50%) Stand to Sit Details: (A) to slowly descend to recliner with cues for hand placement. Stand Pivot Transfers: 1: +2 Total assist;Patient percentage (comment) (pt 60%) Stand Pivot Transfer Details (indicate cue type and reason): (A) to keep upright posture and manual assist to advance hips to recliner and LE in proper placement.  Balance Balance Assessed: Yes Static Sitting Balance Static Sitting - Balance Support: Bilateral upper extremity supported;Feet supported Static Sitting - Level of Assistance: 5: Stand by assistance Static Sitting - Comment/# of Minutes: VCs for upright posture and stand by assit for safety.  Pt sat EOB for 5 minutes while preparing for transfer. Static Standing Balance Static Standing - Balance Support: Bilateral upper extremity supported Static Standing - Level of Assistance: 1: +2 Total assist;Patient percentage (comment) (Pt 60%) Static Standing - Comment/# of Minutes: Pt stood ~2 minutes while needing assist to clean after urine and bowel incontinence. Exercise    End of Session PT - End of Session Equipment Utilized During Treatment: Gait belt Activity Tolerance: Patient limited by fatigue Patient left: in chair;with call bell in reach Nurse  Communication: Mobility status for transfers General Behavior During Session: [facility] for tasks performed Cognition: [facility] for tasks performed  Delonna Ney 05/23/2011, 11:02 AM [phone]

## 2011-05-23 NOTE — Progress Notes (Signed)
UR Completed.  Maria Christian 05/23/2011 [phone]

## 2011-05-23 NOTE — Progress Notes (Signed)
*  PRELIMINARY RESULTS* Lower venous dopplers performed. Preliminary findings showed no obvious evidence of DVT, superficial thrombus or bakers cyst bilateral.  Vinetta Bergamo 05/23/2011, 12:01 PM

## 2011-05-23 NOTE — Progress Notes (Signed)
*  PRELIMINARY RESULTS* Echocardiogram 2D Echocardiogram has been performed.  Glean Salen [facility] 05/23/2011, 4:13 PM

## 2011-05-23 NOTE — Progress Notes (Addendum)
   CARE MANAGEMENT NOTE HEART FAILURE  05/23/2011   Patient:  Maria Christian, Maria Christian   Account Number:  [id number]    Date Initiated:  05/23/2011  Documentation initiated by:  Shannan Harper  Subjective/Objective Assessment:   Patient admitted for lower extremity swelling, heart palpitations, and chest/shoulder pain.  She will be ruled out for MI. Started on IV heparin gtt.   Action/Plan:   Discharge back to ALF.   Anticipated DC Date:  05/24/2011  Anticipated DC Plan:  ASSISTED LIVING / REST HOME  In-house referral:  Clinical Social Worker    DC Planning Services:  CM consult    Choice offered to / List presented to:          Status of service:  Completed, signed off  Medicare Important Message Given:  NA - LOS <3 / Initial given by admissions (If response is "NO", the following Medicare IM given date fields will be blank) Date Medicare IM Given:   Date Additional Medicare IM Given:    Discharge Disposition:  ASSISTED LIVING  Per UR Regulation:  Reviewed for med. necessity/level of care/duration of stay  Comments:   Patient was added to CHF Pilot Program due to possibility of a diagnosis of new onset CHF.  However as patient has progressed it appears she is ruling out for both an MI and CHF.  SW is already working with her on her disposition back to the ALF.  Will continue to assist as needed.  05/23/11 1223 Shannan Harper, RN, BSN   Initial CM contact:     By:   Initial CSW contact:     By:      Is this an INP Readmission < 30 days:   (If "YES" please see readmission information at the bottom of note)  Patient living status prior to this admission:    Patient setting prior to this admission:    Comorbid conditions being treated that contributed to this admission:    CHF Readmission Risk:        Was referral made to [facility]:    Is the patient's PCP the same as attending:   PCP:    Readmission < 30 Days If pt has HH, did they contact the agency before going to  the ED:   Name of [facility] agency:    Was the follow-up physician visit scheduled prior to discharge:    Did the patient follow-up with the physician prior to this readmission:    Was there HF Clinic visits prior to readmission:    Were there ED visits between admissions:    Readmit type:    If unscheduled and related indicate reason for readmit:

## 2011-05-23 NOTE — Progress Notes (Signed)
ANTICOAGULATION CONSULT NOTE - Initial Consult  Pharmacy Consult for coumadin/heparin Indication: afib  No Known Allergies  Patient Measurements: Ht: 5' 9'' Heparin dosing wt:= 70kg  Vital Signs: Temp: 98.1 F (36.7 C) (12/18 0605) BP: 121/69 mmHg (12/18 0605) Pulse Rate: 61  (12/18 0605)  Labs:  Basename 05/23/11 0630 05/22/11 2120 05/22/11 2119 05/22/11 0948  HGB 10.5* -- 11.9* --  HCT 32.7* -- 35.3* 35.2*  PLT 136* -- 143* 116*  APTT -- -- -- 30  LABPROT 15.5* -- -- 14.4  INR 1.20 -- -- 1.10  HEPARINUNFRC 0.30 -- -- --  CREATININE -- -- 0.68 0.69  CKTOTAL 30 32 -- --  CKMB 2.0 2.1 -- --  TROPONINI <0.30 <0.30 -- --   CrCl~60  Medical History: Past Medical History  Diagnosis Date  . Atrial fibrillation   . Altered mental status   . Encephalopathy   . Parkinson disease   . Hypertension   . GERD (gastroesophageal reflux disease)   . Hyperlipemia   . Diabetes mellitus   . Coronary artery disease     Medications PTA Metformin, omeprazole, pravastatin, tramadol, trazodone, metoprolol, aspirin, plavix, lasix, gabapentin, lisinopril, patanol, systane, KCl  Assessment: 77 yo female with afib to start heparin and coumadin. Heparin level 0.3 this AM on 850 units/hour. INR 1.2<---1.1 at baseline after 1 dose 5 mg 12/17. Patient does not appear to be actively bleeding.  Goal of Therapy:  INR 2-3 Heparin level 0.3-0.7 units/ml   Plan:  1) Continue heparin 850 units/hour 2) Coumadin 5 mg PO x1 @1800  3) F/U daily PT/INR, CBC, HL while on heparin   Katrinka Blazing, [place] R 05/23/2011,8:48 AM

## 2011-05-24 DIAGNOSIS — R002 Palpitations: Secondary | ICD-10-CM

## 2011-05-24 DIAGNOSIS — I509 Heart failure, unspecified: Secondary | ICD-10-CM

## 2011-05-24 DIAGNOSIS — I4891 Unspecified atrial fibrillation: Secondary | ICD-10-CM | POA: Diagnosis present

## 2011-05-24 DIAGNOSIS — I5032 Chronic diastolic (congestive) heart failure: Secondary | ICD-10-CM

## 2011-05-24 DIAGNOSIS — R8281 Pyuria: Secondary | ICD-10-CM

## 2011-05-24 LAB — GLUCOSE, CAPILLARY
Glucose-Capillary: 232 mg/dL — ABNORMAL HIGH (ref 70–99)
Glucose-Capillary: 147 mg/dL — ABNORMAL HIGH (ref 70–99)
Glucose-Capillary: 184 mg/dL — ABNORMAL HIGH (ref 70–99)

## 2011-05-24 LAB — CBC
Hemoglobin: 10.8 g/dL — ABNORMAL LOW (ref 12.0–15.0)
MCHC: 32.3 g/dL (ref 30.0–36.0)
Platelets: 139 10*3/uL — ABNORMAL LOW (ref 150–400)
RDW: 14.3 % (ref 11.5–15.5)

## 2011-05-24 MED ORDER — METOPROLOL TARTRATE 25 MG PO TABS: 25.0000 mg | ORAL_TABLET | Freq: Two times a day (BID) | ORAL | Status: DC

## 2011-05-24 NOTE — Progress Notes (Signed)
Clinical Social Work-CSW confirmed d/c with MD/pt son and ALF- CSW completed FL2 with medications and faxed to ALF-pt daughter waiting for pt to arrive-[facility] transport arranged-no further needs. Jodean Lima, [phone]

## 2011-05-24 NOTE — Discharge Summary (Signed)
Internal Medicine Teaching [facility] Discharge Note  Name: Maria Christian MRN: [id number] DOB: 1925-03-04 77 y.o.  Date of Admission: 05/22/2011  9:21 AM Date of Discharge: 05/24/2011 Attending Physician: Staci Righter, MD  Discharge Diagnosis: Principal Problem:  *Palpitations Active Problems:  Diastolic CHF, chronic  Atrial fibrillation with controlled ventricular response  Pyuria   Discharge Medications: Current Discharge Medication List    CONTINUE these medications which have CHANGED   Details  metoprolol tartrate (LOPRESSOR) 25 MG tablet Take 1 tablet (25 mg total) by mouth 2 (two) times daily. Qty: 60 tablet, Refills: 6      CONTINUE these medications which have NOT CHANGED   Details  Chloroxylenol-Zinc Oxide (BAZA EX) Apply 1 application topically 3 (three) times daily.      Cholecalciferol ([employer] VITAMIN D3) 1000 UNITS capsule Take 1,000 Units by mouth daily.      clopidogrel (PLAVIX) 75 MG tablet Take 75 mg by mouth daily.      dorzolamide (TRUSOPT) 2 % ophthalmic solution Place 1 drop into both eyes 3 (three) times daily.      gabapentin (NEURONTIN) 100 MG capsule Take 100 mg by mouth 2 (two) times daily.     lisinopril (PRINIVIL,ZESTRIL) 10 MG tablet Take 10 mg by mouth daily.      metFORMIN (GLUCOPHAGE) 500 MG tablet Take 500 mg by mouth 2 (two) times daily with a meal.      olopatadine (PATANOL) 0.1 % ophthalmic solution Place 1 drop into both eyes daily.      omeprazole (PRILOSEC) 20 MG capsule Take 20 mg by mouth daily.      Polyethyl Glycol-Propyl Glycol (SYSTANE) 0.4-0.3 % SOLN Apply 1 drop to eye 4 (four) times daily.      polyvinyl alcohol (LIQUIFILM TEARS) 1.4 % ophthalmic solution Place 1 drop into both eyes daily.      potassium chloride SA (K-DUR,KLOR-CON) 20 MEQ tablet Take 20 mEq by mouth daily.     pravastatin (PRAVACHOL) 20 MG tablet Take 20 mg by mouth daily.      traMADol (ULTRAM) 50 MG tablet Take 50 mg by mouth every 6 (six)  hours as needed. For pain     Travoprost, BAK Free, (TRAVATAN) 0.004 % SOLN ophthalmic solution Place 1 drop into both eyes at bedtime.      traZODone (DESYREL) 50 MG tablet Take 100 mg by mouth at bedtime.       STOP taking these medications     acetaminophen (TYLENOL) 325 MG tablet      aspirin 81 MG tablet      furosemide (LASIX) 20 MG tablet         Disposition and follow-up:   Ms.Juaquina W Rode was discharged from [facility] in Stable condition.    Follow-up Appointments: Follow-up Information    Follow up with TRIPP,HENRY in 7 days. (As needed)       Follow up with Valera Castle, MD. (Cardiology at 2:30pm)    Contact information:   [address] [place] [address] [place] [address] [phone]       Follow up with Oliver Barre, MD in 41 days. (Internal Medicine (Dr. Posey Rea not accepting new patients))    Contact information:   [address] [facility] [address] [place] [address] [place] [address] [phone]         Discharge Orders    Future Appointments: Provider: Department: Dept Phone: Center:   06/14/2011 2:30 PM  Valera Castle, MD [facility] [phone] [facility]   07/03/2011 1:00 PM Oliver Barre, MD [facility] [phone] [facility]     Future Orders Please Complete By Expires   Diet - low sodium heart healthy      Increase activity slowly      Discharge instructions      Comments:   Follow-up with Dr. Daleen Squibb and Dr. Jonny Ruiz      Consultations: Social Work    Procedures Performed:  Dg Chest 2 View  05/22/2011  *RADIOLOGY REPORT*  Clinical Data: Shortness of breath, diabetes, hypertension  CHEST - 2 VIEW  Comparison: 05/22/2011  Findings: Heart is enlarged with central vascular congestion and streaky basilar atelectasis / scarring.  No definite CHF, consolidation, pneumonia, large effusion or pneumothorax.  Several skin folds overlie the right lung.  Trachea midline. Atherosclerosis of the aorta and  degenerative changes of the spine.  IMPRESSION: Cardiomegaly with vascular congestion and basilar atelectasis / scarring.  Previously described right mid lung ill-defined density, less apparent.  Suspect superimposed shadows.  Original Report Authenticated By: Judie Petit. Ruel Favors, M.D.   Dg Chest 2 View  05/22/2011  *RADIOLOGY REPORT*  Clinical Data: Palpitations.  History of atrial fibrillation. Hypertension and diabetes.  CHEST - 2 VIEW  Comparison: None.  Findings: There is cardiomegaly.  There are no edematous changes. There is vague density seen lateral to the right hilum which may represent a focal infiltrate or subtle mass.  Linear atelectasis/scarring is seen within the left lung base.  Recommend comparison with outside old films of these are available.  If these are unavailable, recommend follow-up chest x-ray in 2-3 weeks.  The central pulmonary arteries appear prominent bilaterally.  The osseous structures are unremarkable for this patient's age.  IMPRESSION:  1.  Cardiomegaly. 2.  Vague density lateral to the right hilum which may represent a mass or focal infiltrate. Recommend follow-up chest x-ray in 2-3 weeks.  3.  Linear atelectasis/scarring within the left base.  Original Report Authenticated By: Rolla Plate, M.D.   Ct Abdomen Pelvis W Contrast  05/22/2011  *RADIOLOGY REPORT*  Clinical Data: Large bruise involving the right lower abdominal quadrant.  CT ABDOMEN AND PELVIS WITH CONTRAST  Technique:  Multidetector CT imaging of the abdomen and pelvis was performed following the standard protocol during bolus administration of intravenous contrast.  Contrast:  100 ml Omnipaque-300  Comparison: None available  Findings:  Normal hepatic contour.  No discrete hepatic lesions.  Post cholecystectomy.  No intra or extrahepatic biliary ductal dilatation.  No ascites.  Incidental note is made of horseshoe kidney.  No discrete renal lesions.  No urinary obstruction.  Possible nonobstructing stone  within the left kidney (image 38, series 2).  The bilateral adrenal glands are normal.  Normal appearance of the spleen.  There is an ill defined approximately 1.0 x 2.2 cm hypoattenuating lesion within the pancreatic body.  This lesion is suboptimally evaluated secondary to phase of enhancement and the without associated pancreatic ductal dilatation or distal atrophy.  There is no definite involvement of the adjacent vasculature.  Enteric contrast extends to the descending colon.  Moderate gaseous distension of the colon without evidence of enteric obstruction. No pneumoperitoneum, pneumatosis or portal venous gas.  There is extensive atherosclerotic calcifications within a normal caliber abdominal aorta.  There are extensive atherosclerotic calcifications within the origin of the celiac and proximal aspect of the SMA, however the distal branches of the SMA appear patent. The IMA remains patent.  No retroperitoneal, mesenteric, pelvic  or inguinal lymphadenopathy.  Post hysterectomy.  No free fluid within the pelvis.  Limited visualization of the lower thorax demonstrates small bilateral pleural effusions and bibasilar atelectasis. Cardiomegaly.  Calcifications within the mitral valve annulus.  No pericardial effusion.  No acute aggressive osseous abnormalities.  Lumbar spine degenerative change.  Post left total hip replacement, and poorly evaluated.  There is a minimal amount of stranding within the subcutaneous tissues of the right lower abdominal quadrant without radiopaque foreign body or definable fluid collection or hematoma.  IMPRESSION:     1.    Minimal amount of stranding within the subcutaneous tissues       of the right lower abdomen without radiopaque foreign body or       definable fluid collection or hematoma.        2.    Indeterminate approximately 2.2 cm hypoattenuating lesion within the pancreatic body, nonspecific, but may represent either a pseudocyst or small intraductal papillary mucinous  neoplasm. This finding is not associated pancreatic ductal dilatation or pancreatic atrophy. Comparison to prior examinations is recommended.  If this 77 year old patient is a treatment candidate, further evaluation may be obtained with a pancreatic protocol MRI. Alternatively, a follow up examination may be performed in 6 months to ensure stability. 3.    Incidental note is made of a horseshoe kidney.  Original Report Authenticated By: Waynard Reeds, M.D.    05/23/2011  2D Echo: Study Conclusions  - Left ventricle: The cavity size was normal. Wall thickness was normal. Systolic function was normal. The estimated ejection fraction was in the range of 55% to 60%. Wall motion was normal; there were no regional wall motion abnormalities. Features are consistent with a pseudonormal left ventricular filling pattern, with concomitant abnormal relaxation and increased filling pressure (grade 2 diastolic dysfunction). - Aortic valve: There was no stenosis. - Mitral valve: Mildly to moderately calcified annulus. Mild regurgitation. - Left atrium: The atrium was moderately to severely dilated. - Right ventricle: The cavity size was normal. Systolic function was normal. - Right atrium: The atrium was moderately to severely dilated. - Tricuspid valve: Peak RV-RA gradient:44mm Hg (S). - Pulmonary arteries: PA systolic pressure 53-57 mmHg. - Systemic veins: IVC measured 2.3 cm with < 50% respirophasic variation, suggesting RA pressure 11-15 mmHg. - Pericardium, extracardiac: A trivial pericardial effusion was identified posterior to the heart. Impressions:  - The patient appeared to be in atrial fibrillation. Normal LV size and systolic function, EF 55-60%. Moderate diastolic dysfunction. Normal RV size and systolic function. Moderate to severe biatrial enlargement. Moderate pulmonary hypertension. Transthoracic echocardiography. M-mode, complete 2D, spectral Doppler, and color Doppler.  Height: Height: 175.3cm. Height: 69in. Weight: Weight: 75.9kg. Weight: 167lb. Body mass index: BMI: 24.7kg/m^2. Body surface area: BSA: 1.27m^2. Blood pressure: 128/74. Patient status: Inpatient. Location: Bedside.   Admission HPI:  Patient is an 77 y/o F with past medical history significant for atrial fibrillation, type 2 diabetes, and hypertension who presents with one to 2 weeks of heart palpitations. She reports these palpitations occur intermittently mainly throughout the day. She denies any associated sob, doe, syncope, diaphoresis, or other complaint. She report some pain primarily in her right shoulder but states this extends into her neck and occasionally across her chest; this is no exertional in nature. She also reports increased LE edema; per daughter and patient, this has progressed over the past few months.  She denies abdominal pain, chest pain, focal weakness/numbness/tingling, dysuria, hematuria, diarrhea, constipation, BRBPR/dark tarry stools, cough, or other  complaint.   Physical exam: GEN: No apparent distress. Alert and oriented x 3. Pleasant, conversant, and cooperative to exam.  HEENT: head is normocephalic, abrasion noted on forehead that appears to be healing. Neck is supple without palpable masses or lymphadenopathy. No JVD or carotid bruits. Vision intact. EOMI. PERRLA. Sclerae anicteric. Conjunctivae without pallor or injection. Mucous membranes are moist. Oropharynx is without erythema, exudates, or other abnormal lesions.  RESP: Lungs are clear to ascultation bilaterally with good air movement. No wheezes, ronchi, or rubs. Mild bibasilar crackles in LLL.  CARDIOVASCULAR: regular rate, irregular rhythm. Clear S1, S2, no murmurs, gallops, or rubs.  ABDOMEN: soft, non-tender, non-distended. Bowels sounds present in all quadrants and normoactive. No palpable masses.  EXT: warm and dry. Peripheral pulses equal, intact, and +2 globally. No clubbing or cyanosis. +1 pitting  edema in bilateral lower extremities extending above knee, R>L.  SKIN: warm and dry with normal turgor. No rashes or abnormal lesions observed.  NEURO: CN II-XII grossly intact. Muscle strength +5/5 in bilateral upper and lower extremities. Sensation is grossly intact. No focal deficit.   Hospital Course by problem list: Principal Problem:  *Palpitations Active Problems:  Diastolic CHF, chronic  Atrial fibrillation with controlled ventricular response  Pyuria   #1 Atrial Fibrillation/Palpitations: Patient reported palpitation prior to presentation to the ED but was without complaints on admission.  She has been rate controlled with metoprolol 50 mg po bid which was decreased to 25 mg po bid as Ms. Vandiver experienced several asymptomatic bradycardic episodes to the 30s and 40s on telemetry. She is not currently anticoagulated, though her CHADSVASC2 score was 4-5 and she would arguably benefit from Coumadin. Given her fall risks, the addition of anticoagulation with coumadin was defer to her new cardiologist. She was discharged with f/u with new cardiologist Dr. Daleen Squibb per daughter's request 06/14/11. Of note, her TSH was within normal limits and records request was sent to prior facility in [place], [place] .   #2 Shoulder/chest discomfort: This was likely musculoskeletal in nature based on patient's description of symptoms and exam findings. EKG was without evidence of ischemia or other concerning coronary process, and cardiac enzymes were negative. Daughter reports that Ms. Melby has been on Plavix since 2002 for "plaques in her arteries" but denies coronary stents or h/o CVA.We continued her on Plavix and will defer further assessment to Dr. Daleen Squibb [facility]).  Her Lipid Panel demonstrated adequate control on simvastatin therapy 10 mg qd. Her discomfort was managed with Tylenol 650 mg prn.  #3 Congestive Heart Failure: No acute exacerbation on this admission. CXR findings were as above with TTE  demonstrating moderate grade 2 diastolic dysfunction.  She had normal left ventricular size and systolic dysfunction with Ejection Fraction of 55-60% and moderate pulmonary hypotension.  We continued her home regimen of lisinopril 10 mg qd and decreased metoprolol to 25 mg po bid secondary to bradycardia. Patient's daughter was concerned and reported being informed by ED physicians that patient had "fluid on her heart" but no signs of pulmonary edema on CXR and ECHO reveals onlt trace pericardial effusion.     #3 Lower Extremity edema: This appears chronic and likely dependent edema and secondary to her diastolic congestive heart failure.  She endorsed tenderness to palpation on bilateral pretibial regions.  Dopplers of her lower legs were negative for DVT or other abnormalities.  She was managed with compression stockings and monitoring of her fluid intake and outake.  #4 Diabetes Mellitus 2: She was admitted  with HemoglobinA1c of 7.7. We held her metformin secondary to her receiving contrast for CT of the abdomen that was obtained per ED after discovery of a asymptomatic hematoma on physical exam.  Her capillary blood glucose ranged from ~150s-230s and was managed with SSI. Metformin was restarted by day of discharge.  She will f/u with her new PCP Dr. Jonny Ruiz of [facility] for further management.  Her serum creatine on admission was 0.68.    #5 HTN: Her blood pressure remained stable and controlled with home regimen of lisinopril and metoprolol  As stated above, we continude lisinopril 10 mg qd and decrease metoprolol from 50 mg bid to 25mg  bid given bradycardic episodes.  #6 Anemia: Patient with hemoglobin that remained stable and asymptomatic at 10.8-11.9 throughout her admission.  This should be further addressed with her new PCP Dr. Jonny Ruiz at hospital follow-up on [id number].  #7 resolving pneumonia: Patient was treated several weeks ago with Levaquin for reported pneumonia.  CXR  conistent with prior infiltrate in right mid lung.  If patient becomes symptomatic consider repeat CXR in 2-3 to re-assess resolution of infiltrate.  #8 lesion of pancreas body:  Incidental finding on CT of abdomen.  Patient with complaints of abdominal or epigastric pain or discomfort.  Follow-up exam in 6 months is suggested to ensure stability and/or if patient complains of symptoms consistent with GI dysfunction..  #9 Disposition: Physical/Occupational Therapy evaluated Ms. Akel with recommendation to continue therapy three times a week upon discharge to improve strengthening, physical endurance, and fulfilling her activities of daily living.  She will return to [facility].  Social Work was consulted to advise patients daughter on disposition options. Ms. Holness daughter was thoroughly updated and clarified of patient's medical condition. Patient's son is in [place] currently and has FPOA.  He was also updated via voicemail messages.  Of note, the patient's daughter requested to f/u with Dr. Daleen Squibb as her treats other members of her family and Dr. Posey Rea for the same reasoning.  Dr. Posey Rea was not accepting new patients and furthermore Ms. Battenfield requested "someone else". Dr. Sherri Rad follows patients at her Assisted Living Facility.   Discharge Vitals:  BP 144/81  Pulse 65  Temp(Src) 97.9 F (36.6 C) (Oral)  Resp 20  Ht 5\' 9"  (1.753 m)  Wt 164 lb 10.9 oz (74.7 kg)  BMI 24.32 kg/m2  SpO2 95%  Discharge Labs:  Results for orders placed during the hospital encounter of 05/22/11 (from the past 24 hour(s))  GLUCOSE, CAPILLARY     Status: Abnormal   Collection Time   05/23/11 11:00 AM      Component Value Range   Glucose-Capillary 166 (*) 70 - 99 (mg/dL)  CARDIAC PANEL(CRET KIN+CKTOT+MB+TROPI)     Status: Normal   Collection Time   05/23/11 12:35 PM      Component Value Range   Total CK 34  7 - 177 (U/L)   CK, MB 2.1  0.3 - 4.0 (ng/mL)   Troponin I  <0.30  <0.30 (ng/mL)   Relative Index RELATIVE INDEX IS INVALID  0.0 - 2.5   GLUCOSE, CAPILLARY     Status: Abnormal   Collection Time   05/23/11  3:59 PM      Component Value Range   Glucose-Capillary 209 (*) 70 - 99 (mg/dL)  GLUCOSE, CAPILLARY     Status: Abnormal   Collection Time   05/23/11  8:15 PM      Component Value Range  Glucose-Capillary 232 (*) 70 - 99 (mg/dL)   Comment 1 Notify RN    GLUCOSE, CAPILLARY     Status: Abnormal   Collection Time   05/24/11  4:14 AM      Component Value Range   Glucose-Capillary 159 (*) 70 - 99 (mg/dL)   Comment 1 Notify RN      Signed: Kristie Cowman 05/24/2011, 7:46 AM

## 2011-05-25 DIAGNOSIS — G9349 Other encephalopathy: Secondary | ICD-10-CM | POA: Diagnosis not present

## 2011-05-25 DIAGNOSIS — R488 Other symbolic dysfunctions: Secondary | ICD-10-CM | POA: Diagnosis not present

## 2011-05-25 DIAGNOSIS — E119 Type 2 diabetes mellitus without complications: Secondary | ICD-10-CM | POA: Diagnosis not present

## 2011-05-25 DIAGNOSIS — G20 Parkinson's disease: Secondary | ICD-10-CM | POA: Diagnosis not present

## 2011-05-25 DIAGNOSIS — R279 Unspecified lack of coordination: Secondary | ICD-10-CM | POA: Diagnosis not present

## 2011-05-25 DIAGNOSIS — M6281 Muscle weakness (generalized): Secondary | ICD-10-CM | POA: Diagnosis not present

## 2011-05-25 LAB — URINE CULTURE: Culture  Setup Time: 201212171643

## 2011-05-26 DIAGNOSIS — G20 Parkinson's disease: Secondary | ICD-10-CM | POA: Diagnosis not present

## 2011-05-26 DIAGNOSIS — R488 Other symbolic dysfunctions: Secondary | ICD-10-CM | POA: Diagnosis not present

## 2011-05-26 DIAGNOSIS — G9349 Other encephalopathy: Secondary | ICD-10-CM | POA: Diagnosis not present

## 2011-05-26 DIAGNOSIS — M6281 Muscle weakness (generalized): Secondary | ICD-10-CM | POA: Diagnosis not present

## 2011-05-26 DIAGNOSIS — R279 Unspecified lack of coordination: Secondary | ICD-10-CM | POA: Diagnosis not present

## 2011-05-26 DIAGNOSIS — E119 Type 2 diabetes mellitus without complications: Secondary | ICD-10-CM | POA: Diagnosis not present

## 2011-05-31 DIAGNOSIS — G20 Parkinson's disease: Secondary | ICD-10-CM | POA: Diagnosis not present

## 2011-05-31 DIAGNOSIS — E119 Type 2 diabetes mellitus without complications: Secondary | ICD-10-CM | POA: Diagnosis not present

## 2011-05-31 DIAGNOSIS — G9349 Other encephalopathy: Secondary | ICD-10-CM | POA: Diagnosis not present

## 2011-05-31 DIAGNOSIS — R488 Other symbolic dysfunctions: Secondary | ICD-10-CM | POA: Diagnosis not present

## 2011-05-31 DIAGNOSIS — M6281 Muscle weakness (generalized): Secondary | ICD-10-CM | POA: Diagnosis not present

## 2011-05-31 DIAGNOSIS — R279 Unspecified lack of coordination: Secondary | ICD-10-CM | POA: Diagnosis not present

## 2011-06-01 DIAGNOSIS — G20 Parkinson's disease: Secondary | ICD-10-CM | POA: Diagnosis not present

## 2011-06-01 DIAGNOSIS — R279 Unspecified lack of coordination: Secondary | ICD-10-CM | POA: Diagnosis not present

## 2011-06-01 DIAGNOSIS — E119 Type 2 diabetes mellitus without complications: Secondary | ICD-10-CM | POA: Diagnosis not present

## 2011-06-01 DIAGNOSIS — G9349 Other encephalopathy: Secondary | ICD-10-CM | POA: Diagnosis not present

## 2011-06-01 DIAGNOSIS — R488 Other symbolic dysfunctions: Secondary | ICD-10-CM | POA: Diagnosis not present

## 2011-06-01 DIAGNOSIS — M6281 Muscle weakness (generalized): Secondary | ICD-10-CM | POA: Diagnosis not present

## 2011-06-02 DIAGNOSIS — G20 Parkinson's disease: Secondary | ICD-10-CM | POA: Diagnosis not present

## 2011-06-02 DIAGNOSIS — E119 Type 2 diabetes mellitus without complications: Secondary | ICD-10-CM | POA: Diagnosis not present

## 2011-06-02 DIAGNOSIS — M6281 Muscle weakness (generalized): Secondary | ICD-10-CM | POA: Diagnosis not present

## 2011-06-02 DIAGNOSIS — R488 Other symbolic dysfunctions: Secondary | ICD-10-CM | POA: Diagnosis not present

## 2011-06-02 DIAGNOSIS — G9349 Other encephalopathy: Secondary | ICD-10-CM | POA: Diagnosis not present

## 2011-06-02 DIAGNOSIS — R279 Unspecified lack of coordination: Secondary | ICD-10-CM | POA: Diagnosis not present

## 2011-06-05 DIAGNOSIS — G20 Parkinson's disease: Secondary | ICD-10-CM | POA: Diagnosis not present

## 2011-06-05 DIAGNOSIS — R279 Unspecified lack of coordination: Secondary | ICD-10-CM | POA: Diagnosis not present

## 2011-06-05 DIAGNOSIS — M6281 Muscle weakness (generalized): Secondary | ICD-10-CM | POA: Diagnosis not present

## 2011-06-05 DIAGNOSIS — R488 Other symbolic dysfunctions: Secondary | ICD-10-CM | POA: Diagnosis not present

## 2011-06-05 DIAGNOSIS — G9349 Other encephalopathy: Secondary | ICD-10-CM | POA: Diagnosis not present

## 2011-06-05 DIAGNOSIS — E119 Type 2 diabetes mellitus without complications: Secondary | ICD-10-CM | POA: Diagnosis not present

## 2011-06-07 DIAGNOSIS — M6281 Muscle weakness (generalized): Secondary | ICD-10-CM | POA: Diagnosis not present

## 2011-06-07 DIAGNOSIS — E785 Hyperlipidemia, unspecified: Secondary | ICD-10-CM | POA: Diagnosis not present

## 2011-06-07 DIAGNOSIS — F0280 Dementia in other diseases classified elsewhere without behavioral disturbance: Secondary | ICD-10-CM | POA: Diagnosis not present

## 2011-06-07 DIAGNOSIS — R488 Other symbolic dysfunctions: Secondary | ICD-10-CM | POA: Diagnosis not present

## 2011-06-07 DIAGNOSIS — R279 Unspecified lack of coordination: Secondary | ICD-10-CM | POA: Diagnosis not present

## 2011-06-07 DIAGNOSIS — I119 Hypertensive heart disease without heart failure: Secondary | ICD-10-CM | POA: Diagnosis not present

## 2011-06-07 DIAGNOSIS — I2510 Atherosclerotic heart disease of native coronary artery without angina pectoris: Secondary | ICD-10-CM | POA: Diagnosis not present

## 2011-06-07 DIAGNOSIS — I4891 Unspecified atrial fibrillation: Secondary | ICD-10-CM | POA: Diagnosis not present

## 2011-06-07 DIAGNOSIS — G20 Parkinson's disease: Secondary | ICD-10-CM | POA: Diagnosis not present

## 2011-06-07 DIAGNOSIS — I5030 Unspecified diastolic (congestive) heart failure: Secondary | ICD-10-CM | POA: Diagnosis not present

## 2011-06-07 DIAGNOSIS — G9349 Other encephalopathy: Secondary | ICD-10-CM | POA: Diagnosis not present

## 2011-06-07 DIAGNOSIS — Z0000 Encounter for general adult medical examination without abnormal findings: Secondary | ICD-10-CM | POA: Diagnosis not present

## 2011-06-07 DIAGNOSIS — E119 Type 2 diabetes mellitus without complications: Secondary | ICD-10-CM | POA: Diagnosis not present

## 2011-06-09 DIAGNOSIS — R279 Unspecified lack of coordination: Secondary | ICD-10-CM | POA: Diagnosis not present

## 2011-06-09 DIAGNOSIS — R488 Other symbolic dysfunctions: Secondary | ICD-10-CM | POA: Diagnosis not present

## 2011-06-09 DIAGNOSIS — M6281 Muscle weakness (generalized): Secondary | ICD-10-CM | POA: Diagnosis not present

## 2011-06-09 DIAGNOSIS — G9349 Other encephalopathy: Secondary | ICD-10-CM | POA: Diagnosis not present

## 2011-06-09 DIAGNOSIS — E119 Type 2 diabetes mellitus without complications: Secondary | ICD-10-CM | POA: Diagnosis not present

## 2011-06-09 DIAGNOSIS — G20 Parkinson's disease: Secondary | ICD-10-CM | POA: Diagnosis not present

## 2011-06-12 DIAGNOSIS — R488 Other symbolic dysfunctions: Secondary | ICD-10-CM | POA: Diagnosis not present

## 2011-06-12 DIAGNOSIS — M6281 Muscle weakness (generalized): Secondary | ICD-10-CM | POA: Diagnosis not present

## 2011-06-12 DIAGNOSIS — G9349 Other encephalopathy: Secondary | ICD-10-CM | POA: Diagnosis not present

## 2011-06-12 DIAGNOSIS — G20 Parkinson's disease: Secondary | ICD-10-CM | POA: Diagnosis not present

## 2011-06-12 DIAGNOSIS — E119 Type 2 diabetes mellitus without complications: Secondary | ICD-10-CM | POA: Diagnosis not present

## 2011-06-12 DIAGNOSIS — R279 Unspecified lack of coordination: Secondary | ICD-10-CM | POA: Diagnosis not present

## 2011-06-13 DIAGNOSIS — G9349 Other encephalopathy: Secondary | ICD-10-CM | POA: Diagnosis not present

## 2011-06-13 DIAGNOSIS — R279 Unspecified lack of coordination: Secondary | ICD-10-CM | POA: Diagnosis not present

## 2011-06-13 DIAGNOSIS — G20 Parkinson's disease: Secondary | ICD-10-CM | POA: Diagnosis not present

## 2011-06-13 DIAGNOSIS — M6281 Muscle weakness (generalized): Secondary | ICD-10-CM | POA: Diagnosis not present

## 2011-06-13 DIAGNOSIS — R488 Other symbolic dysfunctions: Secondary | ICD-10-CM | POA: Diagnosis not present

## 2011-06-13 DIAGNOSIS — E119 Type 2 diabetes mellitus without complications: Secondary | ICD-10-CM | POA: Diagnosis not present

## 2011-06-14 ENCOUNTER — Ambulatory Visit: Payer: Medicare Other | Admitting: Cardiology

## 2011-06-14 DIAGNOSIS — R488 Other symbolic dysfunctions: Secondary | ICD-10-CM | POA: Diagnosis not present

## 2011-06-14 DIAGNOSIS — E119 Type 2 diabetes mellitus without complications: Secondary | ICD-10-CM | POA: Diagnosis not present

## 2011-06-14 DIAGNOSIS — G20 Parkinson's disease: Secondary | ICD-10-CM | POA: Diagnosis not present

## 2011-06-14 DIAGNOSIS — G9349 Other encephalopathy: Secondary | ICD-10-CM | POA: Diagnosis not present

## 2011-06-14 DIAGNOSIS — R279 Unspecified lack of coordination: Secondary | ICD-10-CM | POA: Diagnosis not present

## 2011-06-14 DIAGNOSIS — M6281 Muscle weakness (generalized): Secondary | ICD-10-CM | POA: Diagnosis not present

## 2011-06-16 DIAGNOSIS — M6281 Muscle weakness (generalized): Secondary | ICD-10-CM | POA: Diagnosis not present

## 2011-06-16 DIAGNOSIS — E119 Type 2 diabetes mellitus without complications: Secondary | ICD-10-CM | POA: Diagnosis not present

## 2011-06-16 DIAGNOSIS — G20 Parkinson's disease: Secondary | ICD-10-CM | POA: Diagnosis not present

## 2011-06-16 DIAGNOSIS — G9349 Other encephalopathy: Secondary | ICD-10-CM | POA: Diagnosis not present

## 2011-06-16 DIAGNOSIS — R488 Other symbolic dysfunctions: Secondary | ICD-10-CM | POA: Diagnosis not present

## 2011-06-16 DIAGNOSIS — R279 Unspecified lack of coordination: Secondary | ICD-10-CM | POA: Diagnosis not present

## 2011-06-20 DIAGNOSIS — M6281 Muscle weakness (generalized): Secondary | ICD-10-CM | POA: Diagnosis not present

## 2011-06-20 DIAGNOSIS — G9349 Other encephalopathy: Secondary | ICD-10-CM | POA: Diagnosis not present

## 2011-06-20 DIAGNOSIS — R279 Unspecified lack of coordination: Secondary | ICD-10-CM | POA: Diagnosis not present

## 2011-06-20 DIAGNOSIS — E119 Type 2 diabetes mellitus without complications: Secondary | ICD-10-CM | POA: Diagnosis not present

## 2011-06-20 DIAGNOSIS — G20 Parkinson's disease: Secondary | ICD-10-CM | POA: Diagnosis not present

## 2011-06-20 DIAGNOSIS — R488 Other symbolic dysfunctions: Secondary | ICD-10-CM | POA: Diagnosis not present

## 2011-06-21 DIAGNOSIS — G9349 Other encephalopathy: Secondary | ICD-10-CM | POA: Diagnosis not present

## 2011-06-21 DIAGNOSIS — M6281 Muscle weakness (generalized): Secondary | ICD-10-CM | POA: Diagnosis not present

## 2011-06-21 DIAGNOSIS — R279 Unspecified lack of coordination: Secondary | ICD-10-CM | POA: Diagnosis not present

## 2011-06-21 DIAGNOSIS — E119 Type 2 diabetes mellitus without complications: Secondary | ICD-10-CM | POA: Diagnosis not present

## 2011-06-21 DIAGNOSIS — G20 Parkinson's disease: Secondary | ICD-10-CM | POA: Diagnosis not present

## 2011-06-21 DIAGNOSIS — R488 Other symbolic dysfunctions: Secondary | ICD-10-CM | POA: Diagnosis not present

## 2011-06-22 DIAGNOSIS — G20 Parkinson's disease: Secondary | ICD-10-CM | POA: Diagnosis not present

## 2011-06-22 DIAGNOSIS — R488 Other symbolic dysfunctions: Secondary | ICD-10-CM | POA: Diagnosis not present

## 2011-06-22 DIAGNOSIS — M6281 Muscle weakness (generalized): Secondary | ICD-10-CM | POA: Diagnosis not present

## 2011-06-22 DIAGNOSIS — E119 Type 2 diabetes mellitus without complications: Secondary | ICD-10-CM | POA: Diagnosis not present

## 2011-06-22 DIAGNOSIS — R279 Unspecified lack of coordination: Secondary | ICD-10-CM | POA: Diagnosis not present

## 2011-06-22 DIAGNOSIS — G9349 Other encephalopathy: Secondary | ICD-10-CM | POA: Diagnosis not present

## 2011-06-23 ENCOUNTER — Emergency Department (HOSPITAL_COMMUNITY)
Admission: EM | Admit: 2011-06-23 | Discharge: 2011-06-23 | Disposition: A | Discharge status: Skilled Nursing Facility | Payer: Medicare Other | Attending: Emergency Medicine | Admitting: Emergency Medicine

## 2011-06-23 ENCOUNTER — Other Ambulatory Visit: Payer: Self-pay

## 2011-06-23 ENCOUNTER — Encounter (HOSPITAL_COMMUNITY): Payer: Self-pay | Admitting: *Deleted

## 2011-06-23 ENCOUNTER — Emergency Department (HOSPITAL_COMMUNITY): Payer: Medicare Other

## 2011-06-23 DIAGNOSIS — G20A1 Parkinson's disease without dyskinesia, without mention of fluctuations: Secondary | ICD-10-CM | POA: Insufficient documentation

## 2011-06-23 DIAGNOSIS — Z8679 Personal history of other diseases of the circulatory system: Secondary | ICD-10-CM

## 2011-06-23 DIAGNOSIS — G20 Parkinson's disease: Secondary | ICD-10-CM | POA: Diagnosis not present

## 2011-06-23 DIAGNOSIS — E785 Hyperlipidemia, unspecified: Secondary | ICD-10-CM | POA: Diagnosis not present

## 2011-06-23 DIAGNOSIS — R918 Other nonspecific abnormal finding of lung field: Secondary | ICD-10-CM | POA: Diagnosis not present

## 2011-06-23 DIAGNOSIS — K219 Gastro-esophageal reflux disease without esophagitis: Secondary | ICD-10-CM | POA: Insufficient documentation

## 2011-06-23 DIAGNOSIS — I10 Essential (primary) hypertension: Secondary | ICD-10-CM | POA: Diagnosis not present

## 2011-06-23 DIAGNOSIS — E119 Type 2 diabetes mellitus without complications: Secondary | ICD-10-CM | POA: Insufficient documentation

## 2011-06-23 DIAGNOSIS — R059 Cough, unspecified: Secondary | ICD-10-CM | POA: Diagnosis not present

## 2011-06-23 DIAGNOSIS — R6883 Chills (without fever): Secondary | ICD-10-CM | POA: Insufficient documentation

## 2011-06-23 DIAGNOSIS — Z79899 Other long term (current) drug therapy: Secondary | ICD-10-CM | POA: Diagnosis not present

## 2011-06-23 DIAGNOSIS — J189 Pneumonia, unspecified organism: Secondary | ICD-10-CM | POA: Diagnosis not present

## 2011-06-23 DIAGNOSIS — R509 Fever, unspecified: Secondary | ICD-10-CM | POA: Diagnosis not present

## 2011-06-23 DIAGNOSIS — R05 Cough: Secondary | ICD-10-CM | POA: Diagnosis not present

## 2011-06-23 DIAGNOSIS — I2510 Atherosclerotic heart disease of native coronary artery without angina pectoris: Secondary | ICD-10-CM | POA: Diagnosis not present

## 2011-06-23 DIAGNOSIS — R002 Palpitations: Secondary | ICD-10-CM

## 2011-06-23 DIAGNOSIS — I4891 Unspecified atrial fibrillation: Secondary | ICD-10-CM | POA: Insufficient documentation

## 2011-06-23 DIAGNOSIS — R0602 Shortness of breath: Secondary | ICD-10-CM | POA: Diagnosis not present

## 2011-06-23 LAB — CBC
HCT: 37.3 % (ref 36.0–46.0)
Hemoglobin: 12.1 g/dL (ref 12.0–15.0)
MCV: 96.1 fL (ref 78.0–100.0)
RBC: 3.88 MIL/uL (ref 3.87–5.11)
WBC: 4.4 10*3/uL (ref 4.0–10.5)

## 2011-06-23 LAB — URINALYSIS, ROUTINE W REFLEX MICROSCOPIC
Bilirubin Urine: NEGATIVE
Glucose, UA: NEGATIVE mg/dL
Hgb urine dipstick: NEGATIVE
Specific Gravity, Urine: 1.007 (ref 1.005–1.030)
pH: 7 (ref 5.0–8.0)

## 2011-06-23 LAB — POCT I-STAT, CHEM 8
BUN: 10 mg/dL (ref 6–23)
Chloride: 104 mEq/L (ref 96–112)
Creatinine, Ser: 0.7 mg/dL (ref 0.50–1.10)
Glucose, Bld: 169 mg/dL — ABNORMAL HIGH (ref 70–99)
Potassium: 3.4 mEq/L — ABNORMAL LOW (ref 3.5–5.1)
Sodium: 144 mEq/L (ref 135–145)

## 2011-06-23 LAB — URINE MICROSCOPIC-ADD ON

## 2011-06-23 MED ORDER — MOXIFLOXACIN HCL 400 MG PO TABS: 400.0000 mg | ORAL_TABLET | Freq: Every day | ORAL | Status: AC

## 2011-06-23 MED ORDER — ACETAMINOPHEN 325 MG PO TABS
650.0000 mg | ORAL_TABLET | Freq: Once | ORAL | Status: AC
  Administered 2011-06-23: 650 mg via ORAL
  Filled 2011-06-23: qty 2

## 2011-06-23 MED ORDER — SODIUM CHLORIDE 0.9 % IV BOLUS (SEPSIS)
1000.0000 mL | Freq: Once | INTRAVENOUS | Status: AC
  Administered 2011-06-23: 1000 mL via INTRAVENOUS

## 2011-06-23 MED ORDER — MOXIFLOXACIN HCL 400 MG PO TABS
400.0000 mg | ORAL_TABLET | Freq: Once | ORAL | Status: AC
  Administered 2011-06-23: 400 mg via ORAL
  Filled 2011-06-23: qty 1

## 2011-06-23 NOTE — ED Notes (Signed)
[facility] called for an update due to pt inability to give a comprehensive report of how long she had been coughing and why she was brought to the ED. According to med tech pt called them into her room be pt stated her "heart felt funny" they checked her pulse and noticed that her rate was high and her hands were shaking, so they called EMS. Pt states that she was placed on a new medication. Med tech said pt was taken off her 0.5 xanax last night and that her trazodone was changed from 2 tabs 50 mg at night to 1 tab 50 mg at night. EDP aware.

## 2011-06-23 NOTE — ED Notes (Signed)
[facility] called to transport pt back to [facility]. Report called to [facility] place. Pt changed back into pjs.

## 2011-06-23 NOTE — ED Notes (Signed)
Pt from [facility]. Pt states that she came in today because her heart was racing. Pt states that she hasn't been feeling good unable to clearfy time of not feeling good.

## 2011-06-23 NOTE — ED Notes (Signed)
Per EMS: pt has been having cold/flu like syptoms for the past week week and a half. Pt states that this morning her heart also felt like it was racing pt hs history of Afib and heart racing. Pt currently controlled at a rate of 68.

## 2011-06-23 NOTE — ED Provider Notes (Signed)
History     CSN: [id number]  Arrival date & time 06/23/11  0433   First MD Initiated Contact with Patient 06/23/11 [phone]      Chief Complaint  Patient presents with  . Chills    (Consider location/radiation/quality/duration/timing/severity/associated sxs/prior treatment) The history is provided by the patient and the nursing home.   patient tells me that she is not feeling well for the last week or more so tonight. She denies any specific complaints other than intermittent palpitations and chills. Patient's nursing home was contacted and she reportedly has had some cough, without any fevers. She is also intermittently complaining of palpitations and does have a history of A. fib. Her nurse tonight checked her pulse and by feeling it I was going fast but did not record her heart rate at that time. She called 911. No new medications, but her trazodone was decreased from 2 down to 1 tablet daily and her Xanax was stopped in the last 24 hours. No reported vomiting or diarrhea. No reported rash or fevers. No productive sputum or reports of chest pain or difficulty breathing. Symptoms moderate in severity. Nursing home did note she is prescribed Lopressor as needed for rapid heart rate but did not get any tonight. She is not on Coumadin.  Past Medical History  Diagnosis Date  . Atrial fibrillation   . Altered mental status   . Encephalopathy   . Parkinson disease   . Hypertension   . GERD (gastroesophageal reflux disease)   . Hyperlipemia   . Diabetes mellitus   . Coronary artery disease     History reviewed. No pertinent past surgical history.  History reviewed. No pertinent family history.  History  Substance Use Topics  . Smoking status: Never Smoker   . Smokeless tobacco: Never Used  . Alcohol Use: No    OB History    Grav Para Term Preterm Abortions TAB SAB Ect Mult Living                  Review of Systems  Constitutional: Positive for chills. Negative for fever.    HENT: Negative for neck pain and neck stiffness.   Eyes: Negative for pain.  Respiratory: Positive for cough. Negative for shortness of breath and wheezing.   Cardiovascular: Positive for palpitations. Negative for chest pain and leg swelling.  Gastrointestinal: Negative for abdominal pain.  Genitourinary: Negative for dysuria.  Musculoskeletal: Negative for back pain.  Skin: Negative for rash.  Neurological: Negative for syncope, weakness and headaches.  All other systems reviewed and are negative.    Allergies  Review of patient's allergies indicates no known allergies.  Home Medications   Current Outpatient Rx  Name Route Sig Dispense Refill  . ALPRAZOLAM 0.5 MG PO TABS Oral Take 0.5 mg by mouth at bedtime.    . ASPIRIN 81 MG PO CHEW Oral Chew 81 mg by mouth daily.    . CHOLECALCIFEROL 1000 UNITS PO CAPS Oral Take 1,000 Units by mouth daily.      Marland Kitchen CLOPIDOGREL BISULFATE 75 MG PO TABS Oral Take 75 mg by mouth daily.      Marland Kitchen CRANBERRY 475 MG PO CAPS Oral Take 1 capsule by mouth 2 (two) times daily.    . DONEPEZIL HCL 5 MG PO TABS Oral Take 5 mg by mouth at bedtime.    . DORZOLAMIDE HCL 2 % OP SOLN Both Eyes Place 1 drop into both eyes 3 (three) times daily.      . FUROSEMIDE  20 MG PO TABS Oral Take 20 mg by mouth daily.    Marland Kitchen GABAPENTIN 100 MG PO CAPS Oral Take 100 mg by mouth 2 (two) times daily.     Marland Kitchen LISINOPRIL 10 MG PO TABS Oral Take 10 mg by mouth daily.      Marland Kitchen METFORMIN HCL 500 MG PO TABS Oral Take 500 mg by mouth 2 (two) times daily with a meal.      . METOPROLOL TARTRATE 25 MG PO TABS Oral Take 1 tablet (25 mg total) by mouth 2 (two) times daily. 60 tablet 6  . OLOPATADINE HCL 0.1 % OP SOLN Both Eyes Place 1 drop into both eyes daily.      Marland Kitchen OMEPRAZOLE 20 MG PO CPDR Oral Take 20 mg by mouth daily.      Marland Kitchen POLYETHYL GLYCOL-PROPYL GLYCOL 0.4-0.3 % OP SOLN Ophthalmic Apply 1 drop to eye 4 (four) times daily.      Marland Kitchen POLYVINYL ALCOHOL 1.4 % OP SOLN Both Eyes Place 1 drop into  both eyes daily.      Marland Kitchen POTASSIUM CHLORIDE CRYS ER 20 MEQ PO TBCR Oral Take 20 mEq by mouth daily.     Marland Kitchen PRAVASTATIN SODIUM 20 MG PO TABS Oral Take 20 mg by mouth daily.      Marland Kitchen PROTEIN PO POWD Oral Take 1 scoop by mouth 2 (two) times daily.    . SERTRALINE HCL 25 MG PO TABS Oral Take 25 mg by mouth daily.    . TRAVOPROST (BAK FREE) 0.004 % OP SOLN Both Eyes Place 1 drop into both eyes at bedtime.      . TRAZODONE HCL 50 MG PO TABS Oral Take 100 mg by mouth at bedtime.       BP 174/79  Pulse 72  Temp(Src) 98.7 F (37.1 C) (Rectal)  Resp 13  SpO2 96%  Physical Exam  Constitutional: She is oriented to person, place, and time. She appears well-developed and well-nourished.  HENT:  Head: Normocephalic and atraumatic.  Eyes: Conjunctivae and EOM are normal. Pupils are equal, round, and reactive to light.  Neck: Trachea normal. Neck supple. No thyromegaly present.  Cardiovascular: S1 normal, S2 normal and normal pulses.     No systolic murmur is present   No diastolic murmur is present  Pulses:      Radial pulses are 2+ on the right side, and 2+ on the left side.       Normal rate with irregular rhythm. Distal pulses intact x4  Pulmonary/Chest: Effort normal and breath sounds normal. She has no wheezes. She has no rhonchi. She has no rales. She exhibits no tenderness.  Abdominal: Soft. Normal appearance and bowel sounds are normal. There is no tenderness. There is no CVA tenderness and negative Murphy's sign.  Musculoskeletal:       BLE:s Calves nontender, no cords or erythema, negative Homans sign  Neurological: She is alert and oriented to person, place, and time. She has normal strength. No cranial nerve deficit or sensory deficit. GCS eye subscore is 4. GCS verbal subscore is 5. GCS motor subscore is 6.  Skin: Skin is warm and dry. No rash noted. She is not diaphoretic.  Psychiatric: Her speech is normal.       Cooperative and appropriate    ED Course  Procedures (including  critical care time)  Labs Reviewed  POCT I-STAT, CHEM 8 - Abnormal; Notable for the following:    Potassium 3.4 (*)    Glucose, Bld 169 (*)  All other components within normal limits  CBC  I-STAT, CHEM 8  URINALYSIS, ROUTINE W REFLEX MICROSCOPIC    Date: 06/23/2011  Rate: 79  Rhythm: atrial fibrillation  QRS Axis: normal  Intervals: normal  ST/T Wave abnormalities: nonspecific ST/T changes  Conduction Disutrbances:none  Narrative Interpretation:   Old EKG Reviewed: unchanged  Presentation mostly suggests intermittent A. fib with RVR.   MDM   Nursing home patient with palpitations and history of A. fib with normal rate by EKG and cardiac monitor in the emergency department. Also some cough and chills with chest x-ray obtained and reviewed. Query possible pneumonia. Afebrile and no hypotension or tachypnea or hypoxia. Stable for discharge back to this facility [facility] provided and plan close primary care followup.        Sunnie Nielsen, MD 06/23/11 [phone]

## 2011-06-23 NOTE — ED Notes (Signed)
Pt states that she her brief is wet. Pt was cleaned and changed. In and out also performed at this time, along with rectal temp. Pt states that she has pain in her bottom. Pt was checked for breakdown during rectal temp and no skin breakdown noted at this time. Pt informed EDP aware.

## 2011-06-26 DIAGNOSIS — G9349 Other encephalopathy: Secondary | ICD-10-CM | POA: Diagnosis not present

## 2011-06-26 DIAGNOSIS — R279 Unspecified lack of coordination: Secondary | ICD-10-CM | POA: Diagnosis not present

## 2011-06-26 DIAGNOSIS — R488 Other symbolic dysfunctions: Secondary | ICD-10-CM | POA: Diagnosis not present

## 2011-06-26 DIAGNOSIS — M6281 Muscle weakness (generalized): Secondary | ICD-10-CM | POA: Diagnosis not present

## 2011-06-26 DIAGNOSIS — E119 Type 2 diabetes mellitus without complications: Secondary | ICD-10-CM | POA: Diagnosis not present

## 2011-06-26 DIAGNOSIS — G20 Parkinson's disease: Secondary | ICD-10-CM | POA: Diagnosis not present

## 2011-06-27 DIAGNOSIS — M6281 Muscle weakness (generalized): Secondary | ICD-10-CM | POA: Diagnosis not present

## 2011-06-27 DIAGNOSIS — E119 Type 2 diabetes mellitus without complications: Secondary | ICD-10-CM | POA: Diagnosis not present

## 2011-06-27 DIAGNOSIS — R279 Unspecified lack of coordination: Secondary | ICD-10-CM | POA: Diagnosis not present

## 2011-06-27 DIAGNOSIS — G9349 Other encephalopathy: Secondary | ICD-10-CM | POA: Diagnosis not present

## 2011-06-27 DIAGNOSIS — R488 Other symbolic dysfunctions: Secondary | ICD-10-CM | POA: Diagnosis not present

## 2011-06-27 DIAGNOSIS — G20 Parkinson's disease: Secondary | ICD-10-CM | POA: Diagnosis not present

## 2011-06-30 DIAGNOSIS — R488 Other symbolic dysfunctions: Secondary | ICD-10-CM | POA: Diagnosis not present

## 2011-06-30 DIAGNOSIS — G9349 Other encephalopathy: Secondary | ICD-10-CM | POA: Diagnosis not present

## 2011-06-30 DIAGNOSIS — E119 Type 2 diabetes mellitus without complications: Secondary | ICD-10-CM | POA: Diagnosis not present

## 2011-06-30 DIAGNOSIS — M6281 Muscle weakness (generalized): Secondary | ICD-10-CM | POA: Diagnosis not present

## 2011-06-30 DIAGNOSIS — G20 Parkinson's disease: Secondary | ICD-10-CM | POA: Diagnosis not present

## 2011-06-30 DIAGNOSIS — R279 Unspecified lack of coordination: Secondary | ICD-10-CM | POA: Diagnosis not present

## 2011-07-01 ENCOUNTER — Encounter: Payer: Self-pay | Admitting: Internal Medicine

## 2011-07-01 DIAGNOSIS — E119 Type 2 diabetes mellitus without complications: Secondary | ICD-10-CM | POA: Insufficient documentation

## 2011-07-01 DIAGNOSIS — Z0000 Encounter for general adult medical examination without abnormal findings: Secondary | ICD-10-CM | POA: Insufficient documentation

## 2011-07-01 DIAGNOSIS — I2510 Atherosclerotic heart disease of native coronary artery without angina pectoris: Secondary | ICD-10-CM | POA: Insufficient documentation

## 2011-07-01 DIAGNOSIS — E785 Hyperlipidemia, unspecified: Secondary | ICD-10-CM | POA: Insufficient documentation

## 2011-07-01 DIAGNOSIS — I4891 Unspecified atrial fibrillation: Secondary | ICD-10-CM | POA: Insufficient documentation

## 2011-07-01 DIAGNOSIS — G20 Parkinson's disease: Secondary | ICD-10-CM | POA: Insufficient documentation

## 2011-07-01 DIAGNOSIS — K219 Gastro-esophageal reflux disease without esophagitis: Secondary | ICD-10-CM | POA: Insufficient documentation

## 2011-07-01 DIAGNOSIS — I10 Essential (primary) hypertension: Secondary | ICD-10-CM | POA: Insufficient documentation

## 2011-07-02 ENCOUNTER — Encounter: Payer: Self-pay | Admitting: Internal Medicine

## 2011-07-02 DIAGNOSIS — Z86010 Personal history of colonic polyps: Secondary | ICD-10-CM | POA: Insufficient documentation

## 2011-07-03 ENCOUNTER — Ambulatory Visit: Payer: Medicare Other | Admitting: Internal Medicine

## 2011-07-03 DIAGNOSIS — Z0289 Encounter for other administrative examinations: Secondary | ICD-10-CM

## 2011-07-05 DIAGNOSIS — M6281 Muscle weakness (generalized): Secondary | ICD-10-CM | POA: Diagnosis not present

## 2011-07-05 DIAGNOSIS — G9349 Other encephalopathy: Secondary | ICD-10-CM | POA: Diagnosis not present

## 2011-07-05 DIAGNOSIS — E119 Type 2 diabetes mellitus without complications: Secondary | ICD-10-CM | POA: Diagnosis not present

## 2011-07-05 DIAGNOSIS — G20 Parkinson's disease: Secondary | ICD-10-CM | POA: Diagnosis not present

## 2011-07-05 DIAGNOSIS — R488 Other symbolic dysfunctions: Secondary | ICD-10-CM | POA: Diagnosis not present

## 2011-07-05 DIAGNOSIS — R279 Unspecified lack of coordination: Secondary | ICD-10-CM | POA: Diagnosis not present

## 2011-07-07 ENCOUNTER — Other Ambulatory Visit: Payer: Self-pay

## 2011-07-07 ENCOUNTER — Encounter (HOSPITAL_COMMUNITY): Payer: Self-pay | Admitting: *Deleted

## 2011-07-07 ENCOUNTER — Emergency Department (HOSPITAL_COMMUNITY)
Admission: EM | Admit: 2011-07-07 | Discharge: 2011-07-07 | Disposition: A | Discharge status: Home / Self Care | Payer: Medicare Other | Attending: Emergency Medicine | Admitting: Emergency Medicine

## 2011-07-07 DIAGNOSIS — G20 Parkinson's disease: Secondary | ICD-10-CM | POA: Insufficient documentation

## 2011-07-07 DIAGNOSIS — H9190 Unspecified hearing loss, unspecified ear: Secondary | ICD-10-CM | POA: Insufficient documentation

## 2011-07-07 DIAGNOSIS — I4891 Unspecified atrial fibrillation: Secondary | ICD-10-CM

## 2011-07-07 DIAGNOSIS — I10 Essential (primary) hypertension: Secondary | ICD-10-CM | POA: Insufficient documentation

## 2011-07-07 DIAGNOSIS — E119 Type 2 diabetes mellitus without complications: Secondary | ICD-10-CM | POA: Insufficient documentation

## 2011-07-07 DIAGNOSIS — Z79899 Other long term (current) drug therapy: Secondary | ICD-10-CM | POA: Insufficient documentation

## 2011-07-07 DIAGNOSIS — R609 Edema, unspecified: Secondary | ICD-10-CM | POA: Insufficient documentation

## 2011-07-07 DIAGNOSIS — G20A1 Parkinson's disease without dyskinesia, without mention of fluctuations: Secondary | ICD-10-CM | POA: Insufficient documentation

## 2011-07-07 DIAGNOSIS — I2510 Atherosclerotic heart disease of native coronary artery without angina pectoris: Secondary | ICD-10-CM | POA: Insufficient documentation

## 2011-07-07 DIAGNOSIS — I119 Hypertensive heart disease without heart failure: Secondary | ICD-10-CM | POA: Diagnosis not present

## 2011-07-07 DIAGNOSIS — R6889 Other general symptoms and signs: Secondary | ICD-10-CM | POA: Diagnosis not present

## 2011-07-07 DIAGNOSIS — R110 Nausea: Secondary | ICD-10-CM | POA: Insufficient documentation

## 2011-07-07 LAB — COMPREHENSIVE METABOLIC PANEL
ALT: 8 U/L (ref 0–35)
Albumin: 3.8 g/dL (ref 3.5–5.2)
Alkaline Phosphatase: 97 U/L (ref 39–117)
BUN: 11 mg/dL (ref 6–23)
Chloride: 103 mEq/L (ref 96–112)
GFR calc Af Amer: >90 mL/min (ref >90)
GFR calc non Af Amer: 79 mL/min — ABNORMAL LOW (ref >90)
Glucose, Bld: 178 mg/dL — ABNORMAL HIGH (ref 70–99)
Potassium: 3.6 mEq/L (ref 3.5–5.1)
Sodium: 142 mEq/L (ref 135–145)
Total Protein: 7.4 g/dL (ref 6.0–8.3)

## 2011-07-07 LAB — POCT I-STAT, CHEM 8
Calcium, Ion: 1.17 mmol/L (ref 1.12–1.32)
Glucose, Bld: 181 mg/dL — ABNORMAL HIGH (ref 70–99)
HCT: 37 % (ref 36.0–46.0)
Hemoglobin: 12.6 g/dL (ref 12.0–15.0)

## 2011-07-07 LAB — URINALYSIS, MICROSCOPIC ONLY
Leukocytes, UA: NEGATIVE
Nitrite: NEGATIVE
Specific Gravity, Urine: 1.012 (ref 1.005–1.030)
Urobilinogen, UA: 1 mg/dL (ref 0.0–1.0)

## 2011-07-07 LAB — DIFFERENTIAL
Basophils Relative: 0 % (ref 0–1)
Eosinophils Absolute: 0.1 10*3/uL (ref 0.0–0.7)
Lymphs Abs: 1.4 10*3/uL (ref 0.7–4.0)
Monocytes Relative: 8 % (ref 3–12)
Neutro Abs: 2.6 10*3/uL (ref 1.7–7.7)
Neutrophils Relative %: 58 % (ref 43–77)

## 2011-07-07 LAB — CBC
Hemoglobin: 12 g/dL (ref 12.0–15.0)
Platelets: 144 10*3/uL — ABNORMAL LOW (ref 150–400)
RBC: 3.83 MIL/uL — ABNORMAL LOW (ref 3.87–5.11)

## 2011-07-07 MED ORDER — ONDANSETRON HCL 4 MG PO TABS: 4.0000 mg | ORAL_TABLET | Freq: Four times a day (QID) | ORAL | Status: DC

## 2011-07-07 MED ORDER — SODIUM CHLORIDE 0.9 % IV SOLN
Freq: Once | INTRAVENOUS | Status: AC
  Administered 2011-07-07: 18:00:00 via INTRAVENOUS

## 2011-07-07 MED ORDER — ONDANSETRON HCL 4 MG/2ML IJ SOLN
4.0000 mg | Freq: Once | INTRAMUSCULAR | Status: AC
  Administered 2011-07-07: 4 mg via INTRAVENOUS
  Filled 2011-07-07: qty 2

## 2011-07-07 NOTE — ED Provider Notes (Signed)
History     CSN: [id number]  Arrival date & time 07/07/11  1724   First MD Initiated Contact with Patient 07/07/11 1729      Chief Complaint  Patient presents with  . Nausea  . Hypertension    (Consider location/radiation/quality/duration/timing/severity/associated sxs/prior treatment) Patient is a 78 y.o. female presenting with hypertension. The history is provided by the patient.  Hypertension  She woke up from a nap this afternoon and noted nausea. She denies abdominal pain and denies vomiting. She denies chest pain, dyspnea. She's not had any constipation or diarrhea. Blood pressure was reported to be elevated at 210/100. She states that she's been having problems like this intermittently. She also has a problem with her heart rhythm. Symptoms are moderate to severe. Nothing makes it better nothing makes it worse. She arrived by ambulance with no treatment en route.  Past Medical History  Diagnosis Date  . Atrial fibrillation   . Altered mental status   . Encephalopathy   . Parkinson disease   . Hypertension   . GERD (gastroesophageal reflux disease)   . Hyperlipemia   . Diabetes mellitus   . Coronary artery disease   . History of adenomatous polyp of colon 07/02/2011    Past Surgical History  Procedure Date  . Left shoulder surgury 2001    left clavicle excision and acromioplasty    History reviewed. No pertinent family history.  History  Substance Use Topics  . Smoking status: Never Smoker   . Smokeless tobacco: Never Used  . Alcohol Use: No    OB History    Grav Para Term Preterm Abortions TAB SAB Ect Mult Living                  Review of Systems  HENT: Positive for hearing loss.   Cardiovascular: Positive for leg swelling.       Right leg swells more than left leg  All other systems reviewed and are negative.    Allergies  Review of patient's allergies indicates no known allergies.  Home Medications   Current Outpatient Rx  Name Route Sig  Dispense Refill  . ALPRAZOLAM 0.5 MG PO TABS Oral Take 0.5 mg by mouth at bedtime.    . ASPIRIN 81 MG PO CHEW Oral Chew 81 mg by mouth daily.    . CHOLECALCIFEROL 1000 UNITS PO CAPS Oral Take 1,000 Units by mouth daily.      Marland Kitchen CLOPIDOGREL BISULFATE 75 MG PO TABS Oral Take 75 mg by mouth daily.      Marland Kitchen CRANBERRY 475 MG PO CAPS Oral Take 1 capsule by mouth 2 (two) times daily.    . DONEPEZIL HCL 5 MG PO TABS Oral Take 5 mg by mouth at bedtime.    . DORZOLAMIDE HCL 2 % OP SOLN Both Eyes Place 1 drop into both eyes 3 (three) times daily.      . FUROSEMIDE 20 MG PO TABS Oral Take 20 mg by mouth daily.    Marland Kitchen GABAPENTIN 100 MG PO CAPS Oral Take 100 mg by mouth 2 (two) times daily.     Marland Kitchen LISINOPRIL 10 MG PO TABS Oral Take 10 mg by mouth daily.      Marland Kitchen METFORMIN HCL 500 MG PO TABS Oral Take 500 mg by mouth 2 (two) times daily with a meal.      . METOPROLOL TARTRATE 25 MG PO TABS Oral Take 1 tablet (25 mg total) by mouth 2 (two) times daily. 60 tablet 6  .  OLOPATADINE HCL 0.1 % OP SOLN Both Eyes Place 1 drop into both eyes daily.      Marland Kitchen OMEPRAZOLE 20 MG PO CPDR Oral Take 20 mg by mouth daily.      Marland Kitchen POLYETHYL GLYCOL-PROPYL GLYCOL 0.4-0.3 % OP SOLN Ophthalmic Apply 1 drop to eye 4 (four) times daily.      Marland Kitchen POLYVINYL ALCOHOL 1.4 % OP SOLN Both Eyes Place 1 drop into both eyes daily.      Marland Kitchen POTASSIUM CHLORIDE CRYS ER 20 MEQ PO TBCR Oral Take 20 mEq by mouth daily.     Marland Kitchen PRAVASTATIN SODIUM 20 MG PO TABS Oral Take 20 mg by mouth daily.      Marland Kitchen PROTEIN PO POWD Oral Take 1 scoop by mouth 2 (two) times daily.    . SERTRALINE HCL 25 MG PO TABS Oral Take 25 mg by mouth daily.    . TRAVOPROST (BAK FREE) 0.004 % OP SOLN Both Eyes Place 1 drop into both eyes at bedtime.      . TRAZODONE HCL 50 MG PO TABS Oral Take 100 mg by mouth at bedtime.       BP 189/71  Pulse 81  Temp(Src) 98.7 F (37.1 C) (Oral)  Resp 16  SpO2 97%  Physical Exam  Nursing note and vitals reviewed.  78 year old female who is resting  comfortably and in no acute distress. Vital signs are significant for pressure 189/71. Oxygen saturation is 98% which is normal. Head is normocephalic and atraumatic. PERRLA, EOMI. Oropharynx is clear. Neck is nontender and supple without adenopathy or JVD or bruit. Back is nontender. Lungs are clear without rales, wheezes, rhonchi. Heart has an irregular rhythm with a 2/6 systolic ejection murmur heard along left sternal border. Abdomen is soft, flat, nontender without masses or hepatosplenomegaly and peristalsis is diminished but present. Extremities: There is a 2+ edema on the left leg and 3+ edema on the right leg. Patient states that the asymmetric edema is chronic for her. Skin is warm and moist without rash. Neurologic: Mental status is normal. Cranial nerves are significant only for poor hearing. There no focal motor or sensory deficits.  ED Course  Procedures (including critical care time)  Results for orders placed during the hospital encounter of 07/07/11  CBC      Component Value Range   WBC 4.5  4.0 - 10.5 (K/uL)   RBC 3.83 (*) 3.87 - 5.11 (MIL/uL)   Hemoglobin 12.0  12.0 - 15.0 (g/dL)   HCT 11.9  14.7 - 82.9 (%)   MCV 95.8  78.0 - 100.0 (fL)   MCH 31.3  26.0 - 34.0 (pg)   MCHC 32.7  30.0 - 36.0 (g/dL)   RDW 56.2  13.0 - 86.5 (%)   Platelets 144 (*) 150 - 400 (K/uL)  DIFFERENTIAL      Component Value Range   Neutrophils Relative 58  43 - 77 (%)   Neutro Abs 2.6  1.7 - 7.7 (K/uL)   Lymphocytes Relative 30  12 - 46 (%)   Lymphs Abs 1.4  0.7 - 4.0 (K/uL)   Monocytes Relative 8  3 - 12 (%)   Monocytes Absolute 0.4  0.1 - 1.0 (K/uL)   Eosinophils Relative 3  0 - 5 (%)   Eosinophils Absolute 0.1  0.0 - 0.7 (K/uL)   Basophils Relative 0  0 - 1 (%)   Basophils Absolute 0.0  0.0 - 0.1 (K/uL)  COMPREHENSIVE METABOLIC PANEL      Component  Value Range   Sodium 142  135 - 145 (mEq/L)   Potassium 3.6  3.5 - 5.1 (mEq/L)   Chloride 103  96 - 112 (mEq/L)   CO2 23  19 - 32 (mEq/L)    Glucose, Bld 178 (*) 70 - 99 (mg/dL)   BUN 11  6 - 23 (mg/dL)   Creatinine, Ser 1.61  0.50 - 1.10 (mg/dL)   Calcium 9.7  8.4 - 09.6 (mg/dL)   Total Protein 7.4  6.0 - 8.3 (g/dL)   Albumin 3.8  3.5 - 5.2 (g/dL)   AST 29  0 - 37 (U/L)   ALT 8  0 - 35 (U/L)   Alkaline Phosphatase 97  39 - 117 (U/L)   Total Bilirubin 0.4  0.3 - 1.2 (mg/dL)   GFR calc non Af Amer 79 (*) >90 (mL/min)   GFR calc Af Amer >90  >90 (mL/min)  URINALYSIS, WITH MICROSCOPIC      Component Value Range   Color, Urine YELLOW  YELLOW    APPearance CLEAR  CLEAR    Specific Gravity, Urine 1.012  1.005 - 1.030    pH 7.0  5.0 - 8.0    Glucose, UA NEGATIVE  NEGATIVE (mg/dL)   Hgb urine dipstick NEGATIVE  NEGATIVE    Bilirubin Urine NEGATIVE  NEGATIVE    Ketones, ur NEGATIVE  NEGATIVE (mg/dL)   Protein, ur NEGATIVE  NEGATIVE (mg/dL)   Urobilinogen, UA 1.0  0.0 - 1.0 (mg/dL)   Nitrite NEGATIVE  NEGATIVE    Leukocytes, UA NEGATIVE  NEGATIVE    WBC, UA 0-2  <3 (WBC/hpf)   Squamous Epithelial / LPF RARE  RARE   TROPONIN I      Component Value Range   Troponin I <0.30  <0.30 (ng/mL)  POCT I-STAT, CHEM 8      Component Value Range   Sodium 143  135 - 145 (mEq/L)   Potassium 3.3 (*) 3.5 - 5.1 (mEq/L)   Chloride 105  96 - 112 (mEq/L)   BUN 10  6 - 23 (mg/dL)   Creatinine, Ser 0.45  0.50 - 1.10 (mg/dL)   Glucose, Bld 409 (*) 70 - 99 (mg/dL)   Calcium, Ion 8.11  9.14 - 1.32 (mmol/L)   TCO2 26  0 - 100 (mmol/L)   Hemoglobin 12.6  12.0 - 15.0 (g/dL)   HCT 78.2  95.6 - 21.3 (%)   Dg Chest 2 View  06/23/2011  *RADIOLOGY REPORT*  Clinical Data: Chills and cough.  CHEST - 2 VIEW  Comparison: Chest radiograph performed 05/22/2011  Findings: The lungs are well-aerated.  Vascular congestion is noted; mild left basilar atelectasis or scarring is again seen, stable from the prior study.  Mild right mid lung airspace opacity could reflect mild pneumonia.  This no longer appears to reflect an overlying shadow.  There is no evidence of  pleural effusion or pneumothorax.  The heart is enlarged; calcification is noted within the aortic arch.  No acute osseous abnormalities are seen.  Clips are noted within the right upper quadrant, reflecting prior cholecystectomy.  IMPRESSION:  1.  Mild right midlung airspace opacity could reflect mild pneumonia. 2.  Vascular congestion and cardiomegaly; mild left basilar atelectasis or scarring again seen.  Original Report Authenticated By: Tonia Ghent, M.D.      Date: 07/07/2011  Rate: 96  Rhythm: atrial fibrillation and premature ventricular contractions (PVC)  QRS Axis: normal  Intervals: normal  ST/T Wave abnormalities: nonspecific ST/T changes  Conduction Disutrbances:none  Narrative Interpretation: Atrial fibrillation with controlled ventricular response, occasional PVC, and nonspecific ST-T changes. When compared with ECG of 06/23/2011, no significant changes are seen.  Old EKG Reviewed: unchanged  She was given IV fluids and IV Zofran with good relief of her nausea. Workup is essentially negative. She will be discharged with a prescription for oral Zofran. Blood pressure continues to be mildly to moderately elevated and will need to be monitored, but no indication for changing antihypertensive regimen today.  1. Nausea   2. Hypertension   3. Atrial fibrillation       MDM  Nausea, etiology unclear. Her heart monitor does show atrial fibrillation. EKG and cardiac markers will be obtained to rule out cardiac ischemia. Electrolytes will be checked and she will be given intravenous Zofran. She would not be given hydration given the fact that she already is significantly edematous and has not had any of vomiting.        Dione Booze, MD 07/07/11 2016

## 2011-07-07 NOTE — ED Notes (Signed)
ZOX:[id number]<AV> Expected date:<BR> Expected time:<BR> Means of arrival:<BR> Comments:<BR> M50 - 86 yoF Nausea

## 2011-07-07 NOTE — ED Notes (Signed)
Pt from [facility] nursing home. Pt has had nausea starting at [facility] today. Pt was not given her blood pressure medication this AM for a pulse in the 50s by the nursing facility. Pt had a blood pressure 210/100 for EMS.  Pt denies nausea to EMS.

## 2011-07-13 ENCOUNTER — Emergency Department (HOSPITAL_COMMUNITY): Payer: Medicare Other

## 2011-07-13 ENCOUNTER — Encounter (HOSPITAL_COMMUNITY): Payer: Self-pay | Admitting: Emergency Medicine

## 2011-07-13 ENCOUNTER — Inpatient Hospital Stay (HOSPITAL_COMMUNITY)
Admission: EM | Admit: 2011-07-13 | Discharge: 2011-07-20 | DRG: 689 | Disposition: A | Discharge status: Skilled Nursing Facility | Payer: Medicare Other | Attending: Internal Medicine | Admitting: Internal Medicine

## 2011-07-13 DIAGNOSIS — I2510 Atherosclerotic heart disease of native coronary artery without angina pectoris: Secondary | ICD-10-CM | POA: Diagnosis present

## 2011-07-13 DIAGNOSIS — Z5189 Encounter for other specified aftercare: Secondary | ICD-10-CM | POA: Diagnosis not present

## 2011-07-13 DIAGNOSIS — Z7982 Long term (current) use of aspirin: Secondary | ICD-10-CM | POA: Diagnosis not present

## 2011-07-13 DIAGNOSIS — M25519 Pain in unspecified shoulder: Secondary | ICD-10-CM | POA: Diagnosis not present

## 2011-07-13 DIAGNOSIS — G20 Parkinson's disease: Secondary | ICD-10-CM | POA: Diagnosis present

## 2011-07-13 DIAGNOSIS — D696 Thrombocytopenia, unspecified: Secondary | ICD-10-CM | POA: Diagnosis present

## 2011-07-13 DIAGNOSIS — Z79899 Other long term (current) drug therapy: Secondary | ICD-10-CM | POA: Diagnosis not present

## 2011-07-13 DIAGNOSIS — Z96649 Presence of unspecified artificial hip joint: Secondary | ICD-10-CM | POA: Diagnosis not present

## 2011-07-13 DIAGNOSIS — I4891 Unspecified atrial fibrillation: Secondary | ICD-10-CM | POA: Diagnosis not present

## 2011-07-13 DIAGNOSIS — K219 Gastro-esophageal reflux disease without esophagitis: Secondary | ICD-10-CM | POA: Diagnosis present

## 2011-07-13 DIAGNOSIS — E876 Hypokalemia: Secondary | ICD-10-CM | POA: Diagnosis not present

## 2011-07-13 DIAGNOSIS — Z0000 Encounter for general adult medical examination without abnormal findings: Secondary | ICD-10-CM

## 2011-07-13 DIAGNOSIS — Z7902 Long term (current) use of antithrombotics/antiplatelets: Secondary | ICD-10-CM | POA: Diagnosis not present

## 2011-07-13 DIAGNOSIS — I519 Heart disease, unspecified: Secondary | ICD-10-CM | POA: Diagnosis not present

## 2011-07-13 DIAGNOSIS — B9620 Unspecified Escherichia coli [E. coli] as the cause of diseases classified elsewhere: Secondary | ICD-10-CM | POA: Diagnosis present

## 2011-07-13 DIAGNOSIS — E785 Hyperlipidemia, unspecified: Secondary | ICD-10-CM

## 2011-07-13 DIAGNOSIS — Z860100 Personal history of colon polyps, unspecified: Secondary | ICD-10-CM

## 2011-07-13 DIAGNOSIS — I517 Cardiomegaly: Secondary | ICD-10-CM | POA: Diagnosis not present

## 2011-07-13 DIAGNOSIS — K869 Disease of pancreas, unspecified: Secondary | ICD-10-CM | POA: Diagnosis present

## 2011-07-13 DIAGNOSIS — F0390 Unspecified dementia without behavioral disturbance: Secondary | ICD-10-CM | POA: Diagnosis present

## 2011-07-13 DIAGNOSIS — K8689 Other specified diseases of pancreas: Secondary | ICD-10-CM | POA: Diagnosis present

## 2011-07-13 DIAGNOSIS — R1310 Dysphagia, unspecified: Secondary | ICD-10-CM | POA: Diagnosis not present

## 2011-07-13 DIAGNOSIS — I10 Essential (primary) hypertension: Secondary | ICD-10-CM | POA: Diagnosis present

## 2011-07-13 DIAGNOSIS — G8929 Other chronic pain: Secondary | ICD-10-CM | POA: Diagnosis present

## 2011-07-13 DIAGNOSIS — I509 Heart failure, unspecified: Secondary | ICD-10-CM | POA: Diagnosis present

## 2011-07-13 DIAGNOSIS — R0602 Shortness of breath: Secondary | ICD-10-CM | POA: Diagnosis not present

## 2011-07-13 DIAGNOSIS — Y95 Nosocomial condition: Secondary | ICD-10-CM

## 2011-07-13 DIAGNOSIS — R600 Localized edema: Secondary | ICD-10-CM

## 2011-07-13 DIAGNOSIS — R5381 Other malaise: Secondary | ICD-10-CM | POA: Diagnosis not present

## 2011-07-13 DIAGNOSIS — R8281 Pyuria: Secondary | ICD-10-CM

## 2011-07-13 DIAGNOSIS — D649 Anemia, unspecified: Secondary | ICD-10-CM | POA: Diagnosis present

## 2011-07-13 DIAGNOSIS — N390 Urinary tract infection, site not specified: Secondary | ICD-10-CM | POA: Diagnosis not present

## 2011-07-13 DIAGNOSIS — I5032 Chronic diastolic (congestive) heart failure: Secondary | ICD-10-CM

## 2011-07-13 DIAGNOSIS — M19019 Primary osteoarthritis, unspecified shoulder: Secondary | ICD-10-CM | POA: Diagnosis not present

## 2011-07-13 DIAGNOSIS — R1013 Epigastric pain: Secondary | ICD-10-CM | POA: Diagnosis not present

## 2011-07-13 DIAGNOSIS — R1319 Other dysphagia: Secondary | ICD-10-CM | POA: Diagnosis present

## 2011-07-13 DIAGNOSIS — E119 Type 2 diabetes mellitus without complications: Secondary | ICD-10-CM | POA: Diagnosis present

## 2011-07-13 DIAGNOSIS — R002 Palpitations: Secondary | ICD-10-CM

## 2011-07-13 DIAGNOSIS — J189 Pneumonia, unspecified organism: Secondary | ICD-10-CM | POA: Diagnosis not present

## 2011-07-13 DIAGNOSIS — I5030 Unspecified diastolic (congestive) heart failure: Secondary | ICD-10-CM | POA: Diagnosis present

## 2011-07-13 DIAGNOSIS — F329 Major depressive disorder, single episode, unspecified: Secondary | ICD-10-CM | POA: Diagnosis not present

## 2011-07-13 DIAGNOSIS — Z8701 Personal history of pneumonia (recurrent): Secondary | ICD-10-CM

## 2011-07-13 DIAGNOSIS — E782 Mixed hyperlipidemia: Secondary | ICD-10-CM | POA: Diagnosis not present

## 2011-07-13 DIAGNOSIS — R0989 Other specified symptoms and signs involving the circulatory and respiratory systems: Secondary | ICD-10-CM | POA: Diagnosis not present

## 2011-07-13 DIAGNOSIS — B998 Other infectious disease: Secondary | ICD-10-CM

## 2011-07-13 DIAGNOSIS — G20A1 Parkinson's disease without dyskinesia, without mention of fluctuations: Secondary | ICD-10-CM | POA: Diagnosis present

## 2011-07-13 DIAGNOSIS — J811 Chronic pulmonary edema: Secondary | ICD-10-CM | POA: Diagnosis not present

## 2011-07-13 DIAGNOSIS — R918 Other nonspecific abnormal finding of lung field: Secondary | ICD-10-CM | POA: Diagnosis not present

## 2011-07-13 HISTORY — DX: Cardiomegaly: I51.7

## 2011-07-13 HISTORY — DX: Localized edema: R60.0

## 2011-07-13 LAB — CBC
HCT: 35.9 % — ABNORMAL LOW (ref 36.0–46.0)
Hemoglobin: 12.1 g/dL (ref 12.0–15.0)
MCH: 32.4 pg (ref 26.0–34.0)
MCHC: 33.7 g/dL (ref 30.0–36.0)
MCV: 96 fL (ref 78.0–100.0)
Platelets: 142 10*3/uL — ABNORMAL LOW (ref 150–400)
RBC: 3.74 MIL/uL — ABNORMAL LOW (ref 3.87–5.11)
RDW: 14.6 % (ref 11.5–15.5)
WBC: 7.8 10*3/uL (ref 4.0–10.5)

## 2011-07-13 LAB — DIFFERENTIAL
Basophils Absolute: 0 K/uL (ref 0.0–0.1)
Basophils Relative: 0 % (ref 0–1)
Eosinophils Absolute: 0 K/uL (ref 0.0–0.7)
Eosinophils Relative: 0 % (ref 0–5)
Lymphocytes Relative: 6 % — ABNORMAL LOW (ref 12–46)
Lymphs Abs: 0.5 10*3/uL — ABNORMAL LOW (ref 0.7–4.0)
Monocytes Absolute: 0.5 K/uL (ref 0.1–1.0)
Monocytes Relative: 7 % (ref 3–12)
Neutro Abs: 6.8 K/uL (ref 1.7–7.7)
Neutrophils Relative %: 87 % — ABNORMAL HIGH (ref 43–77)

## 2011-07-13 LAB — URINE MICROSCOPIC-ADD ON

## 2011-07-13 LAB — LACTIC ACID, PLASMA: Lactic Acid, Venous: 1.4 mmol/L (ref 0.5–2.2)

## 2011-07-13 LAB — URINALYSIS, ROUTINE W REFLEX MICROSCOPIC
Bilirubin Urine: NEGATIVE
Glucose, UA: NEGATIVE mg/dL
Ketones, ur: >80 mg/dL — AB
Nitrite: NEGATIVE
Protein, ur: 30 mg/dL — AB
Specific Gravity, Urine: 1.017 (ref 1.005–1.030)
Urobilinogen, UA: 1 mg/dL (ref 0.0–1.0)
pH: 7 (ref 5.0–8.0)

## 2011-07-13 LAB — COMPREHENSIVE METABOLIC PANEL WITH GFR
Albumin: 3.7 g/dL (ref 3.5–5.2)
BUN: 8 mg/dL (ref 6–23)
Calcium: 8.9 mg/dL (ref 8.4–10.5)
Creatinine, Ser: 0.63 mg/dL (ref 0.50–1.10)
GFR calc Af Amer: >90 mL/min (ref >90)
Glucose, Bld: 219 mg/dL — ABNORMAL HIGH (ref 70–99)
Potassium: 2.8 meq/L — ABNORMAL LOW (ref 3.5–5.1)
Total Protein: 7.2 g/dL (ref 6.0–8.3)

## 2011-07-13 LAB — COMPREHENSIVE METABOLIC PANEL
ALT: 7 U/L (ref 0–35)
AST: 15 U/L (ref 0–37)
Alkaline Phosphatase: 81 U/L (ref 39–117)
CO2: 28 mEq/L (ref 19–32)
Chloride: 99 mEq/L (ref 96–112)
GFR calc non Af Amer: 80 mL/min — ABNORMAL LOW (ref >90)
Sodium: 138 mEq/L (ref 135–145)
Total Bilirubin: 0.9 mg/dL (ref 0.3–1.2)

## 2011-07-13 LAB — HEMOGLOBIN A1C
Hgb A1c MFr Bld: 7.5 % — ABNORMAL HIGH (ref <5.7)
Mean Plasma Glucose: 169 mg/dL — ABNORMAL HIGH (ref <117)

## 2011-07-13 LAB — GLUCOSE, CAPILLARY: Glucose-Capillary: 225 mg/dL — ABNORMAL HIGH (ref 70–99)

## 2011-07-13 LAB — POTASSIUM: Potassium: 2.9 meq/L — ABNORMAL LOW (ref 3.5–5.1)

## 2011-07-13 LAB — MRSA PCR SCREENING: MRSA by PCR: NEGATIVE

## 2011-07-13 LAB — PROCALCITONIN: Procalcitonin: <0.1 ng/mL

## 2011-07-13 MED ORDER — ACETAMINOPHEN 325 MG PO TABS
650.0000 mg | ORAL_TABLET | Freq: Four times a day (QID) | ORAL | Status: DC | PRN
  Administered 2011-07-14 – 2011-07-20 (×11): 650 mg via ORAL
  Filled 2011-07-13 (×11): qty 2

## 2011-07-13 MED ORDER — ACETAMINOPHEN 325 MG PO TABS
650.0000 mg | ORAL_TABLET | Freq: Once | ORAL | Status: AC
  Administered 2011-07-13: 650 mg via ORAL
  Filled 2011-07-13: qty 2

## 2011-07-13 MED ORDER — SODIUM CHLORIDE 0.9 % IJ SOLN
3.0000 mL | Freq: Two times a day (BID) | INTRAMUSCULAR | Status: DC
  Administered 2011-07-14 – 2011-07-20 (×9): 3 mL via INTRAVENOUS

## 2011-07-13 MED ORDER — GABAPENTIN 100 MG PO CAPS
100.0000 mg | ORAL_CAPSULE | Freq: Two times a day (BID) | ORAL | Status: DC
  Administered 2011-07-13 – 2011-07-20 (×14): 100 mg via ORAL
  Filled 2011-07-13 (×15): qty 1

## 2011-07-13 MED ORDER — ONDANSETRON HCL 4 MG PO TABS: 4.0000 mg | ORAL_TABLET | Freq: Four times a day (QID) | ORAL | Status: DC | PRN

## 2011-07-13 MED ORDER — TRAZODONE HCL 100 MG PO TABS
100.0000 mg | ORAL_TABLET | Freq: Every day | ORAL | Status: DC
  Administered 2011-07-13 – 2011-07-19 (×7): 100 mg via ORAL
  Filled 2011-07-13 (×9): qty 1

## 2011-07-13 MED ORDER — SENNOSIDES-DOCUSATE SODIUM 8.6-50 MG PO TABS
1.0000 | ORAL_TABLET | Freq: Every evening | ORAL | Status: DC | PRN
  Administered 2011-07-17: 1 via ORAL
  Filled 2011-07-13: qty 1

## 2011-07-13 MED ORDER — VANCOMYCIN HCL IN DEXTROSE 1-5 GM/200ML-% IV SOLN
1000.0000 mg | Freq: Once | INTRAVENOUS | Status: AC
  Administered 2011-07-13: 1000 mg via INTRAVENOUS
  Filled 2011-07-13: qty 200

## 2011-07-13 MED ORDER — DONEPEZIL HCL 5 MG PO TABS
5.0000 mg | ORAL_TABLET | Freq: Every day | ORAL | Status: DC
  Administered 2011-07-13 – 2011-07-19 (×7): 5 mg via ORAL
  Filled 2011-07-13 (×8): qty 1

## 2011-07-13 MED ORDER — VITAMIN D 1000 UNITS PO TABS
1000.0000 [IU] | ORAL_TABLET | Freq: Every day | ORAL | Status: DC
  Administered 2011-07-13 – 2011-07-20 (×8): 1000 [IU] via ORAL
  Filled 2011-07-13 (×8): qty 1

## 2011-07-13 MED ORDER — ACETAMINOPHEN 650 MG RE SUPP: 650.0000 mg | Freq: Four times a day (QID) | RECTAL | Status: DC | PRN

## 2011-07-13 MED ORDER — METOPROLOL TARTRATE 25 MG PO TABS
25.0000 mg | ORAL_TABLET | Freq: Two times a day (BID) | ORAL | Status: DC
  Administered 2011-07-13 – 2011-07-20 (×14): 25 mg via ORAL
  Filled 2011-07-13 (×15): qty 1

## 2011-07-13 MED ORDER — PIPERACILLIN-TAZOBACTAM 3.375 G IVPB
3.3750 g | Freq: Once | INTRAVENOUS | Status: AC
  Administered 2011-07-13: 3.375 g via INTRAVENOUS
  Filled 2011-07-13: qty 50

## 2011-07-13 MED ORDER — INSULIN ASPART 100 UNIT/ML ~~LOC~~ SOLN
0.0000 [IU] | Freq: Three times a day (TID) | SUBCUTANEOUS | Status: DC
  Administered 2011-07-14: 3 [IU] via SUBCUTANEOUS
  Administered 2011-07-14: 5 [IU] via SUBCUTANEOUS
  Administered 2011-07-14 – 2011-07-15 (×4): 3 [IU] via SUBCUTANEOUS
  Administered 2011-07-16 (×2): 5 [IU] via SUBCUTANEOUS
  Administered 2011-07-16: 2 [IU] via SUBCUTANEOUS
  Administered 2011-07-17: 5 [IU] via SUBCUTANEOUS
  Administered 2011-07-17: 2 [IU] via SUBCUTANEOUS
  Administered 2011-07-17 – 2011-07-18 (×3): 3 [IU] via SUBCUTANEOUS
  Administered 2011-07-18: 2 [IU] via SUBCUTANEOUS
  Administered 2011-07-19: 3 [IU] via SUBCUTANEOUS
  Administered 2011-07-19: 2 [IU] via SUBCUTANEOUS
  Administered 2011-07-19 – 2011-07-20 (×2): 3 [IU] via SUBCUTANEOUS
  Administered 2011-07-20: 2 [IU] via SUBCUTANEOUS
  Filled 2011-07-13 (×2): qty 3

## 2011-07-13 MED ORDER — INSULIN ASPART 100 UNIT/ML ~~LOC~~ SOLN
0.0000 [IU] | Freq: Every day | SUBCUTANEOUS | Status: DC
  Administered 2011-07-13 – 2011-07-14 (×2): 2 [IU] via SUBCUTANEOUS
  Administered 2011-07-17: 0 [IU] via SUBCUTANEOUS

## 2011-07-13 MED ORDER — RENA-VITE PO TABS
1.0000 | ORAL_TABLET | Freq: Every day | ORAL | Status: DC
  Administered 2011-07-13 – 2011-07-20 (×8): 1 via ORAL
  Filled 2011-07-13 (×10): qty 1

## 2011-07-13 MED ORDER — POTASSIUM CHLORIDE CRYS ER 20 MEQ PO TBCR
40.0000 meq | EXTENDED_RELEASE_TABLET | Freq: Once | ORAL | Status: AC
  Administered 2011-07-13: 40 meq via ORAL
  Filled 2011-07-13: qty 2

## 2011-07-13 MED ORDER — POTASSIUM CHLORIDE CRYS ER 20 MEQ PO TBCR
20.0000 meq | EXTENDED_RELEASE_TABLET | Freq: Every day | ORAL | Status: DC
  Administered 2011-07-13 – 2011-07-20 (×8): 20 meq via ORAL
  Filled 2011-07-13 (×8): qty 1

## 2011-07-13 MED ORDER — SIMVASTATIN 10 MG PO TABS
10.0000 mg | ORAL_TABLET | Freq: Every day | ORAL | Status: DC
  Administered 2011-07-13 – 2011-07-19 (×7): 10 mg via ORAL
  Filled 2011-07-13 (×8): qty 1

## 2011-07-13 MED ORDER — SERTRALINE HCL 25 MG PO TABS
25.0000 mg | ORAL_TABLET | Freq: Every day | ORAL | Status: DC
  Administered 2011-07-13 – 2011-07-20 (×8): 25 mg via ORAL
  Filled 2011-07-13 (×8): qty 1

## 2011-07-13 MED ORDER — LEVOFLOXACIN IN D5W 750 MG/150ML IV SOLN
750.0000 mg | INTRAVENOUS | Status: DC
  Administered 2011-07-14 – 2011-07-16 (×3): 750 mg via INTRAVENOUS
  Filled 2011-07-13 (×4): qty 150

## 2011-07-13 MED ORDER — POLYVINYL ALCOHOL 1.4 % OP SOLN
1.0000 [drp] | Freq: Four times a day (QID) | OPHTHALMIC | Status: DC
  Administered 2011-07-13 – 2011-07-20 (×27): 1 [drp] via OPHTHALMIC
  Filled 2011-07-13: qty 15

## 2011-07-13 MED ORDER — PIPERACILLIN-TAZOBACTAM 3.375 G IVPB
3.3750 g | Freq: Four times a day (QID) | INTRAVENOUS | Status: DC
  Administered 2011-07-13 – 2011-07-14 (×3): 3.375 g via INTRAVENOUS
  Filled 2011-07-13 (×8): qty 50

## 2011-07-13 MED ORDER — VANCOMYCIN HCL 1000 MG IV SOLR
750.0000 mg | Freq: Two times a day (BID) | INTRAVENOUS | Status: DC
  Administered 2011-07-14 – 2011-07-17 (×7): 750 mg via INTRAVENOUS
  Filled 2011-07-13 (×9): qty 750

## 2011-07-13 MED ORDER — POLYETHYL GLYCOL-PROPYL GLYCOL 0.4-0.3 % OP SOLN: 1.0000 [drp] | Freq: Four times a day (QID) | OPHTHALMIC | Status: DC

## 2011-07-13 MED ORDER — LEVOFLOXACIN IN D5W 500 MG/100ML IV SOLN
500.0000 mg | INTRAVENOUS | Status: DC
  Filled 2011-07-13: qty 100

## 2011-07-13 MED ORDER — CYANOCOBALAMIN 500 MCG PO TABS
500.0000 ug | ORAL_TABLET | Freq: Every day | ORAL | Status: DC
  Administered 2011-07-13 – 2011-07-20 (×8): 500 ug via ORAL
  Filled 2011-07-13 (×8): qty 1

## 2011-07-13 MED ORDER — CLOPIDOGREL BISULFATE 75 MG PO TABS
75.0000 mg | ORAL_TABLET | Freq: Every day | ORAL | Status: DC
  Administered 2011-07-13 – 2011-07-20 (×8): 75 mg via ORAL
  Filled 2011-07-13 (×8): qty 1

## 2011-07-13 MED ORDER — TRAVOPROST (BAK FREE) 0.004 % OP SOLN
1.0000 [drp] | Freq: Every day | OPHTHALMIC | Status: DC
  Administered 2011-07-13 – 2011-07-19 (×7): 1 [drp] via OPHTHALMIC
  Filled 2011-07-13: qty 2.5

## 2011-07-13 MED ORDER — OLOPATADINE HCL 0.1 % OP SOLN
1.0000 [drp] | Freq: Every day | OPHTHALMIC | Status: DC
  Administered 2011-07-13 – 2011-07-20 (×8): 1 [drp] via OPHTHALMIC
  Filled 2011-07-13: qty 5

## 2011-07-13 MED ORDER — ONDANSETRON HCL 4 MG/2ML IJ SOLN
4.0000 mg | Freq: Four times a day (QID) | INTRAMUSCULAR | Status: DC | PRN
  Administered 2011-07-14: 4 mg via INTRAVENOUS
  Filled 2011-07-13: qty 2

## 2011-07-13 MED ORDER — FUROSEMIDE 20 MG PO TABS
20.0000 mg | ORAL_TABLET | Freq: Every day | ORAL | Status: DC
  Administered 2011-07-13 – 2011-07-20 (×8): 20 mg via ORAL
  Filled 2011-07-13 (×8): qty 1

## 2011-07-13 MED ORDER — LISINOPRIL 10 MG PO TABS
10.0000 mg | ORAL_TABLET | Freq: Every day | ORAL | Status: DC
  Administered 2011-07-13 – 2011-07-20 (×8): 10 mg via ORAL
  Filled 2011-07-13 (×8): qty 1

## 2011-07-13 MED ORDER — SODIUM CHLORIDE 0.9 % IV BOLUS (SEPSIS)
500.0000 mL | Freq: Once | INTRAVENOUS | Status: AC
  Administered 2011-07-13: 500 mL via INTRAVENOUS

## 2011-07-13 MED ORDER — KETOROLAC TROMETHAMINE 15 MG/ML IJ SOLN
15.0000 mg | Freq: Once | INTRAMUSCULAR | Status: AC
  Administered 2011-07-13: 15 mg via INTRAVENOUS
  Filled 2011-07-13 (×2): qty 1

## 2011-07-13 MED ORDER — VITAMINS A & D EX OINT
TOPICAL_OINTMENT | CUTANEOUS | Status: AC
  Filled 2011-07-13: qty 5

## 2011-07-13 MED ORDER — DORZOLAMIDE HCL 2 % OP SOLN
1.0000 [drp] | Freq: Three times a day (TID) | OPHTHALMIC | Status: DC
  Administered 2011-07-13 – 2011-07-20 (×21): 1 [drp] via OPHTHALMIC
  Filled 2011-07-13: qty 10

## 2011-07-13 MED ORDER — POTASSIUM CHLORIDE 10 MEQ/100ML IV SOLN
10.0000 meq | INTRAVENOUS | Status: AC
  Administered 2011-07-13 – 2011-07-14 (×4): 10 meq via INTRAVENOUS
  Filled 2011-07-13 (×4): qty 100

## 2011-07-13 MED ORDER — RESOURCE INSTANT PROTEIN PO PWD PACKET
1.0000 | Freq: Two times a day (BID) | ORAL | Status: DC
  Administered 2011-07-13 – 2011-07-20 (×14): 6 g via ORAL
  Filled 2011-07-13 (×15): qty 6

## 2011-07-13 MED ORDER — PANTOPRAZOLE SODIUM 40 MG PO TBEC
40.0000 mg | DELAYED_RELEASE_TABLET | Freq: Every day | ORAL | Status: DC
  Administered 2011-07-13 – 2011-07-20 (×8): 40 mg via ORAL
  Filled 2011-07-13 (×8): qty 1

## 2011-07-13 MED ORDER — VANCOMYCIN HCL 10 G IV SOLR
1.0000 g | Freq: Once | INTRAVENOUS | Status: DC
  Filled 2011-07-13: qty 1000

## 2011-07-13 MED ORDER — POLYVINYL ALCOHOL 1.4 % OP SOLN: 1.0000 [drp] | Freq: Every day | OPHTHALMIC | Status: DC

## 2011-07-13 NOTE — ED Notes (Signed)
RUE:[id number]<JW> Expected date:07/13/11<BR> Expected time:11:50 AM<BR> Means of arrival:Ambulance<BR> Comments:<BR> EMS 62 GC, 82 yof general illness

## 2011-07-13 NOTE — ED Notes (Signed)
Called to give report to pam, rn. Pam, rn will call back in 5 min

## 2011-07-13 NOTE — Progress Notes (Signed)
ANTIBIOTIC CONSULT NOTE - INITIAL  Pharmacy Consult for Vancomycin Indication: HCAP  No Known Allergies  Patient Measurements: Height: 5\' 6"  (167.6 cm) Weight: 169 lb 12.1 oz (77 kg) IBW/kg (Calculated) : 59.3   Vital Signs: Temp: 98.3 F (36.8 C) (02/07 1725) Temp src: Oral (02/07 1725) BP: 139/80 mmHg (02/07 1725) Pulse Rate: 75  (02/07 1725) Intake/Output from previous day:   Intake/Output from this shift:    Labs:  Basename 07/13/11 1300  WBC 7.8  HGB 12.1  PLT 142*  LABCREA --  CREATININE 0.63   Estimated Creatinine Clearance: 54.9 ml/min (by C-G formula based on Cr of 0.63). Normalized CrCl ~43ml/min/1.73m2    Microbiology: No results found for this or any previous visit (from the past 720 hour(s)). Pan cx pending  Medical History: Past Medical History  Diagnosis Date  . Atrial fibrillation   . Altered mental status   . Encephalopathy   . Parkinson disease   . Hypertension   . GERD (gastroesophageal reflux disease)   . Hyperlipemia   . Diabetes mellitus   . Coronary artery disease   . History of adenomatous polyp of colon 07/02/2011  . Enlarged heart   . DEMENTIA   . Edema of lower extremity 07/13/11    right leg more swollen than left leg    Medications:  Prescriptions prior to admission  Medication Sig Dispense Refill  . Acetic Acid-Cetylpyridinium ([place] [employer] [facility]) Place vaginally every 7 (seven) days.      Marland Kitchen aspirin 81 MG chewable tablet Chew 81 mg by mouth daily.      . Cholecalciferol ([employer] VITAMIN D3) 1000 UNITS capsule Take 1,000 Units by mouth daily.       . clopidogrel (PLAVIX) 75 MG tablet Take 75 mg by mouth daily.       . Cranberry 475 MG CAPS Take 1 capsule by mouth 2 (two) times daily.      Marland Kitchen donepezil (ARICEPT) 5 MG tablet Take 5 mg by mouth at bedtime.      . dorzolamide (TRUSOPT) 2 % ophthalmic solution Place 1 drop into both eyes 3 (three) times daily.       . furosemide (LASIX) 20 MG tablet Take 20 mg by mouth  daily.      Marland Kitchen gabapentin (NEURONTIN) 100 MG capsule Take 100 mg by mouth 2 (two) times daily.       Marland Kitchen lisinopril (PRINIVIL,ZESTRIL) 10 MG tablet Take 10 mg by mouth daily.       . metFORMIN (GLUCOPHAGE) 500 MG tablet Take 500 mg by mouth 2 (two) times daily with a meal.       . metoprolol tartrate (LOPRESSOR) 25 MG tablet Take 1 tablet (25 mg total) by mouth 2 (two) times daily.  60 tablet  6  . multivitamin (RENA-VIT) TABS tablet Take 1 tablet by mouth daily.      Marland Kitchen olopatadine (PATANOL) 0.1 % ophthalmic solution Place 1 drop into both eyes daily.       Marland Kitchen omeprazole (PRILOSEC) 20 MG capsule Take 20 mg by mouth daily.       . ondansetron (ZOFRAN) 4 MG tablet Take 4 mg by mouth every 6 (six) hours as needed. For nausea/vomiting      . Polyethyl Glycol-Propyl Glycol (SYSTANE) 0.4-0.3 % SOLN Apply 1 drop to eye 4 (four) times daily.       . polyvinyl alcohol (LIQUIFILM TEARS) 1.4 % ophthalmic solution Place 1 drop into both eyes daily.       Marland Kitchen  potassium chloride SA (K-DUR,KLOR-CON) 20 MEQ tablet Take 20 mEq by mouth daily.       . pravastatin (PRAVACHOL) 20 MG tablet Take 20 mg by mouth at bedtime.       . Protein POWD Take 1 scoop by mouth 2 (two) times daily.      . sertraline (ZOLOFT) 25 MG tablet Take 25 mg by mouth daily.      . Skin Protectants, Misc. (BAZA PROTECT EX) Apply 1 application topically as needed. To buttocks. May keep at bedside.      . Travoprost, BAK Free, (TRAVATAN) 0.004 % SOLN ophthalmic solution Place 1 drop into both eyes at bedtime.      . traZODone (DESYREL) 50 MG tablet Take 100 mg by mouth at bedtime.       . vitamin B-12 (CYANOCOBALAMIN) 500 MCG tablet Take 500 mcg by mouth daily.       Scheduled:    . acetaminophen  650 mg Oral Once  . cholecalciferol  1,000 Units Oral Daily  . clopidogrel  75 mg Oral Daily  . cyanocobalamin  500 mcg Oral Daily  . donepezil  5 mg Oral QHS  . dorzolamide  1 drop Both Eyes TID  . furosemide  20 mg Oral Daily  . gabapentin  100  mg Oral BID  . insulin aspart  0-15 Units Subcutaneous TID WC  . insulin aspart  0-5 Units Subcutaneous QHS  . ketorolac  15 mg Intravenous Once  . levofloxacin (LEVAQUIN) IV  500 mg Intravenous Q24H  . lisinopril  10 mg Oral Daily  . metoprolol tartrate  25 mg Oral BID  . multivitamin  1 tablet Oral Daily  . olopatadine  1 drop Both Eyes Daily  . pantoprazole  40 mg Oral Q1200  . piperacillin-tazobactam (ZOSYN)  IV  3.375 g Intravenous Once  . piperacillin-tazobactam (ZOSYN)  IV  3.375 g Intravenous Q6H  . polyvinyl alcohol  1 drop Both Eyes QID  . potassium chloride SA  20 mEq Oral Daily  . potassium chloride  40 mEq Oral Once  . protein supplement  1 scoop Oral BID  . sertraline  25 mg Oral Daily  . simvastatin  10 mg Oral q1800  . sodium chloride  500 mL Intravenous Once  . sodium chloride  3 mL Intravenous Q12H  . Travoprost (BAK Free)  1 drop Both Eyes QHS  . traZODone  100 mg Oral QHS  . vancomycin  1,000 mg Intravenous Once  . DISCONTD: Polyethyl Glycol-Propyl Glycol  1 drop Ophthalmic QID  . DISCONTD: polyvinyl alcohol  1 drop Both Eyes Daily  . DISCONTD: vancomycin  1 g Intravenous Once   Infusions:   PRN: acetaminophen, acetaminophen, ondansetron (ZOFRAN) IV, ondansetron, ondansetron, senna-docusate Assessment: 78 yo F w/suspected HCAP. CXR = Patchy left lower lung opacity is unchanged (from 03/2012) and is likely atelectasis On Levaquin and Zosyn. Pharmacy to dose vancomycin.   Goal of Therapy:  Vancomycin trough level 15-20 mcg/ml  Plan:  Vancomycin 750mg  IV q12h. Follow labs, vitals and cultures. Vancomycin trough at steady state if necessary.  Adjust dose as appropriate.  Gwen Her PharmD  [phone] 07/13/2011 5:59 PM

## 2011-07-13 NOTE — ED Provider Notes (Signed)
History     CSN: [id number]  Arrival date & time 07/13/11  1154   First MD Initiated Contact with Patient 07/13/11 1207      Chief Complaint  Patient presents with  . Generalized Body Aches  . Illness    (Consider location/radiation/quality/duration/timing/severity/associated sxs/prior treatment) HPI Comments: Level 5 caveat due to overall illness and dementia history.  Patient reports that she has felt ill for about one week. She felt worse today with associated decrease in appetite and energy level. She denies runny nose or sore throat. She has had a cough for several days that is intermittently productive of clear sputum. She reports that she has seen a streak of blood in her coughing once or twice. Reportedly the patient did have some nausea and vomiting at the facility but has not been seen by EMS. Reportedly the patient had a temperature of 102 at her facility. She did not receive any medications for it. EMS was called and transported the patient here. The patient appears to be at her baseline mental status according to description provided. She reports that she's had a good appetite up until today. She denies any nausea vomiting or diarrhea. She has had an increase in number of stools however. She denies that it is black or bloody. She denies any significant chest pain. She does endorse some overall discomfort in her muscles.  The history is provided by the patient, the nursing home and the EMS personnel.    Past Medical History  Diagnosis Date  . Atrial fibrillation   . Altered mental status   . Encephalopathy   . Parkinson disease   . Hypertension   . GERD (gastroesophageal reflux disease)   . Hyperlipemia   . Diabetes mellitus   . Coronary artery disease   . History of adenomatous polyp of colon 07/02/2011    Past Surgical History  Procedure Date  . Left shoulder surgury 2001    left clavicle excision and acromioplasty    History reviewed. No pertinent family  history.  History  Substance Use Topics  . Smoking status: Never Smoker   . Smokeless tobacco: Never Used  . Alcohol Use: No    OB History    Grav Para Term Preterm Abortions TAB SAB Ect Mult Living                  Review of Systems  Unable to perform ROS: Other    Allergies  Review of patient's allergies indicates no known allergies.  Home Medications   Current Outpatient Rx  Name Route Sig Dispense Refill  . MASSENGILL VINEGAR AND WATER [facility] Vaginal Place vaginally every 7 (seven) days.    . ASPIRIN 81 MG PO CHEW Oral Chew 81 mg by mouth daily.    . CHOLECALCIFEROL 1000 UNITS PO CAPS Oral Take 1,000 Units by mouth daily.     Marland Kitchen CLOPIDOGREL BISULFATE 75 MG PO TABS Oral Take 75 mg by mouth daily.     Marland Kitchen CRANBERRY 475 MG PO CAPS Oral Take 1 capsule by mouth 2 (two) times daily.    . DONEPEZIL HCL 5 MG PO TABS Oral Take 5 mg by mouth at bedtime.    . DORZOLAMIDE HCL 2 % OP SOLN Both Eyes Place 1 drop into both eyes 3 (three) times daily.     . FUROSEMIDE 20 MG PO TABS Oral Take 20 mg by mouth daily.    Marland Kitchen GABAPENTIN 100 MG PO CAPS Oral Take 100 mg by mouth 2 (  two) times daily.     Marland Kitchen LISINOPRIL 10 MG PO TABS Oral Take 10 mg by mouth daily.     Marland Kitchen METFORMIN HCL 500 MG PO TABS Oral Take 500 mg by mouth 2 (two) times daily with a meal.     . METOPROLOL TARTRATE 25 MG PO TABS Oral Take 1 tablet (25 mg total) by mouth 2 (two) times daily. 60 tablet 6  . RENA-VITE PO TABS Oral Take 1 tablet by mouth daily.    . OLOPATADINE HCL 0.1 % OP SOLN Both Eyes Place 1 drop into both eyes daily.     Marland Kitchen OMEPRAZOLE 20 MG PO CPDR Oral Take 20 mg by mouth daily.     Marland Kitchen ONDANSETRON HCL 4 MG PO TABS Oral Take 4 mg by mouth every 6 (six) hours as needed. For nausea/vomiting    . POLYETHYL GLYCOL-PROPYL GLYCOL 0.4-0.3 % OP SOLN Ophthalmic Apply 1 drop to eye 4 (four) times daily.     Marland Kitchen POLYVINYL ALCOHOL 1.4 % OP SOLN Both Eyes Place 1 drop into both eyes daily.     Marland Kitchen POTASSIUM CHLORIDE CRYS ER 20 MEQ PO  TBCR Oral Take 20 mEq by mouth daily.     Marland Kitchen PRAVASTATIN SODIUM 20 MG PO TABS Oral Take 20 mg by mouth at bedtime.     Marland Kitchen PROTEIN PO POWD Oral Take 1 scoop by mouth 2 (two) times daily.    . SERTRALINE HCL 25 MG PO TABS Oral Take 25 mg by mouth daily.    Marland Kitchen BAZA PROTECT EX Apply externally Apply 1 application topically as needed. To buttocks. May keep at bedside.    . TRAVOPROST (BAK FREE) 0.004 % OP SOLN Both Eyes Place 1 drop into both eyes at bedtime.    . TRAZODONE HCL 50 MG PO TABS Oral Take 100 mg by mouth at bedtime.     Marland Kitchen VITAMIN B-12 500 MCG PO TABS Oral Take 500 mcg by mouth daily.      BP 147/68  Pulse 91  Temp(Src) 101 F (38.3 C) (Oral)  Resp 17  SpO2 98%  Physical Exam  Nursing note and vitals reviewed. Constitutional: She appears well-developed and well-nourished. No distress.  HENT:  Head: Normocephalic and atraumatic.  Eyes: Conjunctivae and EOM are normal. Pupils are equal, round, and reactive to light. No scleral icterus.  Neck: Neck supple. No tracheal deviation present. No thyromegaly present.  Cardiovascular:  No murmur heard. Pulmonary/Chest: Effort normal. No respiratory distress. She has no wheezes. She has rales.  Abdominal: Soft. She exhibits no distension. There is no tenderness. There is no rebound and no guarding.  Musculoskeletal: She exhibits no tenderness.       Mild swelling of her bilateral lower extremities without any significant pitting. This involves her feet ankles and lower portions of her lower legs.  Neurological: She is alert. No cranial nerve deficit.  Skin: Skin is warm. No rash noted. No pallor.  Psychiatric: Her mood appears not anxious. Her affect is not angry. Her speech is delayed. She does not exhibit a depressed mood.    ED Course  Procedures (including critical care time)  CRITICAL CARE Performed by: Lear Ng.   Total critical care time: 30 min  Critical care time was exclusive of separately billable procedures and  treating other patients.  Critical care was necessary to treat or prevent imminent or life-threatening deterioration.  Critical care was time spent personally by me on the following activities: development of treatment plan with  patient and/or surrogate as well as nursing, discussions with consultants, evaluation of patient's response to treatment, examination of patient, obtaining history from patient or surrogate, ordering and performing treatments and interventions, ordering and review of laboratory studies, ordering and review of radiographic studies, pulse oximetry and re-evaluation of patient's condition.  Labs Reviewed  CBC - Abnormal; Notable for the following:    RBC 3.74 (*)    HCT 35.9 (*)    Platelets 142 (*)    All other components within normal limits  DIFFERENTIAL - Abnormal; Notable for the following:    Neutrophils Relative 87 (*)    Lymphocytes Relative 6 (*)    Lymphs Abs 0.5 (*)    All other components within normal limits  COMPREHENSIVE METABOLIC PANEL - Abnormal; Notable for the following:    Potassium 2.8 (*)    Glucose, Bld 219 (*)    GFR calc non Af Amer 80 (*)    All other components within normal limits  URINALYSIS, ROUTINE W REFLEX MICROSCOPIC - Abnormal; Notable for the following:    APPearance CLOUDY (*)    Hgb urine dipstick SMALL (*)    Ketones, ur >80 (*)    Protein, ur 30 (*)    Leukocytes, UA LARGE (*)    All other components within normal limits  URINE MICROSCOPIC-ADD ON - Abnormal; Notable for the following:    Squamous Epithelial / LPF FEW (*)    Bacteria, UA MANY (*)    Casts HYALINE CASTS (*)    All other components within normal limits  LACTIC ACID, PLASMA  PROCALCITONIN  URINE CULTURE  CULTURE, BLOOD (ROUTINE X 2)  CULTURE, BLOOD (ROUTINE X 2)  POTASSIUM   Dg Chest Portable 1 View  07/13/2011  *RADIOLOGY REPORT*  Clinical Data: Body aches, weakness, shortness of breath, low oxygen saturation, cough and fever  PORTABLE CHEST - 1 VIEW   Comparison: March 22, 2012  Findings: There is cardiomegaly and mild pulmonary edema, unchanged.   Patchy opacity in the left lower lung remains present and not significantly changed, likely atelectasis.  No effusions are identified.  IMPRESSION: Cardiomegaly and mild pulmonary edema.  Patchy left lower lung opacity is unchanged and is likely atelectasis.  Original Report Authenticated By: Brandon Melnick, M.D.     1. Urinary tract infection   2. Healthcare-associated infection     Room air saturations were 90% which is low.  MDM  Possibly pneumonia.  Will get labs, CXR, cultures, likely admission given age and comorbitities.  IVF's for rehydration in boluses.  No hypotension at this time.        UTI evident.  However given initial as well as her significant cough and prior history of pneumonia, I suspect the patient may also have a Holter associated pneumonia. Plan is to get cultures and start IV Zosyn and vancomycin. Her lactic acid calcium tone and are reassuring. K+ is slightly low, given replacement and will admit to tele.  I spoke to Dr. Cleotis Lema with [facility].  DR. Florentina Jenny is her PCP.    Gavin Pound. Karmela Bram, MD 07/13/11 1620

## 2011-07-13 NOTE — H&P (Signed)
PCP:  Florentina Jenny, MD, MD   DOA:  07/13/2011 11:55 AM  Chief Complaint:  Generalized weakness, cough.   HPI:  patient is demented, history obtained from daughter and ER notes. She is 78 years old Caucasian woman nursing home resident with multiple comorbidities, was brought into the ER today with chief complaint of generalized weakness, cough productive of yellowish sputum with streaks of blood and fever of 101 at the facility. Associated with mild shortness of breath, she denies any chest pain. She is also complaining of nausea and vomiting as per patient she vomited red/orange material , however her daughter stated that patient has been drinking cranberry juice regularly at the nursing home and she felt that which was referring to. There was no report from the nursing home for any hematemesis or hematochezia. In the ER UA was positive for pyuria, chest x-ray showed chronic changes as below. Patient was found to be febrile with a rectal temperature of 102 she, she was given Zosyn and vancomycin and we were asked to admit  for suspected pneumonia and UTI.   Allergies: No Known Allergies  Pr wanted tior to Admission medications   Medication Sig Start Date End Date Taking? Authorizing Provider  Acetic Acid-Cetylpyridinium ([facility] VINEGAR AND WATER [facility]) Place vaginally every 7 (seven) days.   Yes Historical Provider, MD  aspirin 81 MG chewable tablet Chew 81 mg by mouth daily.   Yes Historical Provider, MD  Cholecalciferol ([facility] VITAMIN D3) 1000 UNITS capsule Take 1,000 Units by mouth daily.    Yes Historical Provider, MD  clopidogrel (PLAVIX) 75 MG tablet Take 75 mg by mouth daily.    Yes Historical Provider, MD  Cranberry 475 MG CAPS Take 1 capsule by mouth 2 (two) times daily.   Yes Historical Provider, MD  donepezil (ARICEPT) 5 MG tablet Take 5 mg by mouth at bedtime.   Yes Historical Provider, MD  dorzolamide (TRUSOPT) 2 % ophthalmic solution Place 1 drop into both eyes 3 (three) times daily.     Yes Historical Provider, MD  furosemide (LASIX) 20 MG tablet Take 20 mg by mouth daily.   Yes Historical Provider, MD  gabapentin (NEURONTIN) 100 MG capsule Take 100 mg by mouth 2 (two) times daily.    Yes Historical Provider, MD  lisinopril (PRINIVIL,ZESTRIL) 10 MG tablet Take 10 mg by mouth daily.    Yes Historical Provider, MD  metFORMIN (GLUCOPHAGE) 500 MG tablet Take 500 mg by mouth 2 (two) times daily with a meal.    Yes Historical Provider, MD  metoprolol tartrate (LOPRESSOR) 25 MG tablet Take 1 tablet (25 mg total) by mouth 2 (two) times daily. 05/24/11 05/23/12 Yes Kristie Cowman, MD  multivitamin (RENA-VIT) TABS tablet Take 1 tablet by mouth daily.   Yes Historical Provider, MD  olopatadine (PATANOL) 0.1 % ophthalmic solution Place 1 drop into both eyes daily.    Yes Historical Provider, MD  omeprazole (PRILOSEC) 20 MG capsule Take 20 mg by mouth daily.    Yes Historical Provider, MD  ondansetron (ZOFRAN) 4 MG tablet Take 4 mg by mouth every 6 (six) hours as needed. For nausea/vomiting 07/07/11 07/14/11 Yes Dione Booze, MD  Polyethyl Glycol-Propyl Glycol (SYSTANE) 0.4-0.3 % SOLN Apply 1 drop to eye 4 (four) times daily.    Yes Historical Provider, MD  polyvinyl alcohol (LIQUIFILM TEARS) 1.4 % ophthalmic solution Place 1 drop into both eyes daily.    Yes Historical Provider, MD  potassium chloride SA (K-DUR,KLOR-CON) 20 MEQ tablet Take 20 mEq by  mouth daily.    Yes Historical Provider, MD  pravastatin (PRAVACHOL) 20 MG tablet Take 20 mg by mouth at bedtime.    Yes Historical Provider, MD  Protein POWD Take 1 scoop by mouth 2 (two) times daily.   Yes Historical Provider, MD  sertraline (ZOLOFT) 25 MG tablet Take 25 mg by mouth daily.   Yes Historical Provider, MD  Skin Protectants, Misc. (BAZA PROTECT EX) Apply 1 application topically as needed. To buttocks. May keep at bedside.   Yes Historical Provider, MD  Travoprost, BAK Free, (TRAVATAN) 0.004 % SOLN ophthalmic solution Place 1 drop into  both eyes at bedtime.   Yes Historical Provider, MD  traZODone (DESYREL) 50 MG tablet Take 100 mg by mouth at bedtime.    Yes Historical Provider, MD  vitamin B-12 (CYANOCOBALAMIN) 500 MCG tablet Take 500 mcg by mouth daily.   Yes Historical Provider, MD    Past Medical History  Diagnosis Date  . Atrial fibrillation   . Altered mental status   . Encephalopathy   . Parkinson disease   . Hypertension   . GERD (gastroesophageal reflux disease)   . Hyperlipemia   . Diabetes mellitus   . Coronary artery disease   . History of adenomatous polyp of colon 07/02/2011  . Enlarged heart   . DEMENTIA   . Edema of lower extremity 07/13/11    right leg more swollen than left leg    Past Surgical History  Procedure Date  . Left shoulder surgury 2001    left clavicle excision and acromioplasty  . Abdominal hysterectomy   . Cholecystectomy   . Joint replacement     right hip    Social History: Nursing home resident, reports that she has never smoked.  She reports that she does not drink alcohol or use illicit drugs.   Review of Systems:  As above in history of present illness. Difficult to obtain given history of dementia.    Physical Exam:  Filed Vitals:   07/13/11 1215 07/13/11 1226 07/13/11 1400 07/13/11 1428  BP: 179/86 156/84  147/68  Pulse: 91 94 89 91  Temp: 100 F (37.8 C) 102.6 F (39.2 C)  101 F (38.3 C)  TempSrc: Oral Rectal  Oral  Resp: 17 16 16 17   SpO2: 90% 95% 97% 98%    Constitutional: Vital signs reviewed.  Patient is  confused  in no acute distress . Alert and oriented x1. Neck: Supple, Trachea midline normal ROM, No JVD, mass, thyromegaly, or carotid bruit present.  Cardiovascular :irregular irregular rhythm, S1 normal, S2 normal, no MRG, pulses symmetric and intact bilaterally Pulmonary/Chest: CTAB, no wheezes, rales, or rhonchi Abdominal: Soft. Non-tender, non-distended, bowel sounds are normal, no masses, organomegaly, or guarding present.  Ext: Trace  bilateral pedal edema and no cyanosis, pulses palpable bilaterally  Neurological: A&O x1, moves all extremities, no focal deficits appreciated.    Labs on Admission:  Results for orders placed during the hospital encounter of 07/13/11 (from the past 48 hour(s))  CBC     Status: Abnormal   Collection Time   07/13/11  1:00 PM      Component Value Range Comment   WBC 7.8  4.0 - 10.5 (K/uL)    RBC 3.74 (*) 3.87 - 5.11 (MIL/uL)    Hemoglobin 12.1  12.0 - 15.0 (g/dL)    HCT 16.1 (*) 09.6 - 46.0 (%)    MCV 96.0  78.0 - 100.0 (fL)    MCH 32.4  26.0 - 34.0 (  pg)    MCHC 33.7  30.0 - 36.0 (g/dL)    RDW 47.8  29.5 - 62.1 (%)    Platelets 142 (*) 150 - 400 (K/uL)   DIFFERENTIAL     Status: Abnormal   Collection Time   07/13/11  1:00 PM      Component Value Range Comment   Neutrophils Relative 87 (*) 43 - 77 (%)    Lymphocytes Relative 6 (*) 12 - 46 (%)    Monocytes Relative 7  3 - 12 (%)    Eosinophils Relative 0  0 - 5 (%)    Basophils Relative 0  0 - 1 (%)    Neutro Abs 6.8  1.7 - 7.7 (K/uL)    Lymphs Abs 0.5 (*) 0.7 - 4.0 (K/uL)    Monocytes Absolute 0.5  0.1 - 1.0 (K/uL)    Eosinophils Absolute 0.0  0.0 - 0.7 (K/uL)    Basophils Absolute 0.0  0.0 - 0.1 (K/uL)    WBC Morphology TOXIC GRANULATION     COMPREHENSIVE METABOLIC PANEL     Status: Abnormal   Collection Time   07/13/11  1:00 PM      Component Value Range Comment   Sodium 138  135 - 145 (mEq/L)    Potassium 2.8 (*) 3.5 - 5.1 (mEq/L)    Chloride 99  96 - 112 (mEq/L)    CO2 28  19 - 32 (mEq/L)    Glucose, Bld 219 (*) 70 - 99 (mg/dL)    BUN 8  6 - 23 (mg/dL)    Creatinine, Ser 3.08  0.50 - 1.10 (mg/dL)    Calcium 8.9  8.4 - 10.5 (mg/dL)    Total Protein 7.2  6.0 - 8.3 (g/dL)    Albumin 3.7  3.5 - 5.2 (g/dL)    AST 15  0 - 37 (U/L)    ALT 7  0 - 35 (U/L)    Alkaline Phosphatase 81  39 - 117 (U/L)    Total Bilirubin 0.9  0.3 - 1.2 (mg/dL)    GFR calc non Af Amer 80 (*) >90 (mL/min)    GFR calc Af Amer >90  >90 (mL/min)     LACTIC ACID, PLASMA     Status: Normal   Collection Time   07/13/11  1:00 PM      Component Value Range Comment   Lactic Acid, Venous 1.4  0.5 - 2.2 (mmol/L)   PROCALCITONIN     Status: Normal   Collection Time   07/13/11  1:00 PM      Component Value Range Comment   Procalcitonin <0.10     URINALYSIS, ROUTINE W REFLEX MICROSCOPIC     Status: Abnormal   Collection Time   07/13/11  1:29 PM      Component Value Range Comment   Color, Urine YELLOW  YELLOW     APPearance CLOUDY (*) CLEAR     Specific Gravity, Urine 1.017  1.005 - 1.030     pH 7.0  5.0 - 8.0     Glucose, UA NEGATIVE  NEGATIVE (mg/dL)    Hgb urine dipstick SMALL (*) NEGATIVE     Bilirubin Urine NEGATIVE  NEGATIVE     Ketones, ur >80 (*) NEGATIVE (mg/dL)    Protein, ur 30 (*) NEGATIVE (mg/dL)    Urobilinogen, UA 1.0  0.0 - 1.0 (mg/dL)    Nitrite NEGATIVE  NEGATIVE     Leukocytes, UA LARGE (*) NEGATIVE    URINE MICROSCOPIC-ADD ON  Status: Abnormal   Collection Time   07/13/11  1:29 PM      Component Value Range Comment   Squamous Epithelial / LPF FEW (*) RARE     WBC, UA 21-50  <3 (WBC/hpf)    RBC / HPF 3-6  <3 (RBC/hpf)    Bacteria, UA MANY (*) RARE     Casts HYALINE CASTS (*) NEGATIVE      Radiological Exams on Admission: Dg Chest Portable 1 View  07/13/2011  *RADIOLOGY REPORT*  Clinical Data: Body aches, weakness, shortness of breath, low oxygen saturation, cough and fever  PORTABLE CHEST - 1 VIEW  Comparison: March 22, 2012  Findings: There is cardiomegaly and mild pulmonary edema, unchanged.   Patchy opacity in the left lower lung remains present and not significantly changed, likely atelectasis.  No effusions are identified.  IMPRESSION: Cardiomegaly and mild pulmonary edema.  Patchy left lower lung opacity is unchanged and is likely atelectasis.  Original Report Authenticated By: Brandon Melnick, M.D.    Assessment/Plan Principal Problem:  *Suspected Pneumonia Active Problems:  UTI (lower urinary tract  infection) Hypokalemia  Atrial fibrillation with controlled ventricular response  Hypertension  Diabetes mellitus Plan Admit to telemetry Chest x-ray showed chronic changes however patient is symptomatic with productive cough and fever. Continue with Zosyn, vancomycin and add Levaquin for suspected healthcare associated pneumonia and also to cover for  UTI.Follow urine culture Continue Lasix by mouth, replete potassium and check magnesium level. Given the streaks of blood in sputum I will hold aspirin however continue his Plavix and monitor closely. Given poor intake, hold metformin and place on nsulin scale. Continue metoprolol and lisinopril for blood pressure control and rate control. PT consult CODE STATUS, full code.  Time Spent on Admission: Approximately 50 minutes   Lando Alcalde 07/13/2011, 4:52 PM

## 2011-07-13 NOTE — ED Notes (Signed)
Per ems pt is from [facility]. Alert and oriented for her baseline. Non ambulatory. ems reports pt has been n/v x3 days. At present pt is not "vomitting" but spitting in a puke bag. Pt is febrile 101.9. Staff at facility did not give pt anything for fever. Pt did not get any of her morning meds. Staff called ems because of fever.

## 2011-07-13 NOTE — ED Notes (Signed)
Report given to pam, rn on floor.

## 2011-07-14 ENCOUNTER — Inpatient Hospital Stay (HOSPITAL_COMMUNITY): Payer: Medicare Other

## 2011-07-14 DIAGNOSIS — I4891 Unspecified atrial fibrillation: Secondary | ICD-10-CM | POA: Diagnosis not present

## 2011-07-14 DIAGNOSIS — E782 Mixed hyperlipidemia: Secondary | ICD-10-CM | POA: Diagnosis not present

## 2011-07-14 DIAGNOSIS — R0989 Other specified symptoms and signs involving the circulatory and respiratory systems: Secondary | ICD-10-CM | POA: Diagnosis not present

## 2011-07-14 DIAGNOSIS — I517 Cardiomegaly: Secondary | ICD-10-CM | POA: Diagnosis not present

## 2011-07-14 DIAGNOSIS — E876 Hypokalemia: Secondary | ICD-10-CM | POA: Diagnosis not present

## 2011-07-14 DIAGNOSIS — J189 Pneumonia, unspecified organism: Secondary | ICD-10-CM | POA: Diagnosis not present

## 2011-07-14 DIAGNOSIS — D696 Thrombocytopenia, unspecified: Secondary | ICD-10-CM | POA: Diagnosis not present

## 2011-07-14 LAB — CBC
HCT: 31.5 % — ABNORMAL LOW (ref 36.0–46.0)
Hemoglobin: 10.4 g/dL — ABNORMAL LOW (ref 12.0–15.0)
MCHC: 33 g/dL (ref 30.0–36.0)
RDW: 14.5 % (ref 11.5–15.5)
WBC: 7.9 10*3/uL (ref 4.0–10.5)

## 2011-07-14 LAB — BASIC METABOLIC PANEL
Chloride: 102 mEq/L (ref 96–112)
GFR calc Af Amer: 88 mL/min — ABNORMAL LOW (ref >90)
GFR calc non Af Amer: 76 mL/min — ABNORMAL LOW (ref >90)
Potassium: 3.8 mEq/L (ref 3.5–5.1)

## 2011-07-14 LAB — MAGNESIUM: Magnesium: 1.4 mg/dL — ABNORMAL LOW (ref 1.5–2.5)

## 2011-07-14 LAB — GLUCOSE, CAPILLARY
Glucose-Capillary: 158 mg/dL — ABNORMAL HIGH (ref 70–99)
Glucose-Capillary: 171 mg/dL — ABNORMAL HIGH (ref 70–99)

## 2011-07-14 MED ORDER — DM-GUAIFENESIN ER 30-600 MG PO TB12
1.0000 | ORAL_TABLET | Freq: Two times a day (BID) | ORAL | Status: DC
  Administered 2011-07-14 – 2011-07-20 (×12): 1 via ORAL
  Filled 2011-07-14 (×13): qty 1

## 2011-07-14 MED ORDER — SODIUM CHLORIDE 0.9 % IV SOLN
INTRAVENOUS | Status: DC
  Administered 2011-07-14: via INTRAVENOUS

## 2011-07-14 MED ORDER — PIPERACILLIN-TAZOBACTAM 3.375 G IVPB
3.3750 g | Freq: Three times a day (TID) | INTRAVENOUS | Status: DC
  Administered 2011-07-14 – 2011-07-17 (×8): 3.375 g via INTRAVENOUS
  Filled 2011-07-14 (×11): qty 50

## 2011-07-14 NOTE — Plan of Care (Signed)
Problem: Phase I Progression Outcomes Goal: Voiding-avoid urinary catheter unless indicated Outcome: Not Met (add Reason) Foley inserted  Comments:  Problem: Phase I Progression Outcomes  Goal: Voiding-avoid urinary catheter unless indicated  Outcome: Not Met (add Reason)  Foley inserted due to unable to void

## 2011-07-14 NOTE — Progress Notes (Signed)
Inpatient Diabetes Program Recommendations  AACE/ADA: New Consensus Statement on Inpatient Glycemic Control (2009)  Target Ranges:  Prepandial:   less than 140 mg/dL      Peak postprandial:   less than 180 mg/dL (1-2 hours)      Critically ill patients:  140 - 180 mg/dL   Reason for Visit: Hyperglycemia  Results for Maria, Christian (MRN [id number]) as of 07/14/2011 11:59  Ref. Range 07/13/2011 17:25 07/13/2011 21:15 07/14/2011 08:06  Glucose-Capillary Latest Range: 70-99 mg/dL 130 (H) 865 (H) 784 (H)    Inpatient Diabetes Program Recommendations Insulin - Meal Coverage: may need Novolog 2 - 3 units tidwc for meal coverage insulin if CBGs >180.

## 2011-07-14 NOTE — Progress Notes (Signed)
PROGRESS NOTE  Maria Christian ZOX:[id number] DOB: Dec 13, 1924 DOA: 07/13/2011 PCP: Florentina Jenny, MD, MD  Brief narrative: 78 year old Caucasian female admitted with possible healthcare associated pneumonia  Past medical history: Atrial fibrillation with Patient [place] score 5, CHF-EF 55-60% (grade 2 diastolic dysfunction) diabetes mellitus (A1c 7.7), possible pancreatic lesion (needs Rpt Ct 3-4 mo)   Consultants:  None  Procedures:  07/13/11 = patchy left lower lung opacity, likely atelectasis  Urine Culture pending  Antibiotics:  Zosyn 2/7 >>  Vancomycin 2/7 >>  7Subjective  Did not to sleep well last night, complains of some vague chest pain and left side more so with coughing. Productive sputum with red tinge. Also complains of lower extremity pain. No shortness of breath. The daughter at bedside   Objective    Interim History: Chart Reviewed H&P reviewed    Objective: Filed Vitals:   07/13/11 1428 07/13/11 1725 07/13/11 2119 07/14/11 1348  BP: 147/68 139/80 127/73 122/66  Pulse: 91 75 73 67  Temp: 101 F (38.3 C) 98.3 F (36.8 C) 99.1 F (37.3 C) 98.2 F (36.8 C)  TempSrc: Oral Oral Axillary Oral  Resp: 17 16 18 18   Height:  5\' 6"  (1.676 m)    Weight:  77 kg (169 lb 12.1 oz)    SpO2: 98% 96% 98% 97%    Intake/Output Summary (Last 24 hours) at 07/14/11 1550 Last data filed at 07/14/11 1300  Gross per 24 hour  Intake    490 ml  Output    550 ml  Net    -60 ml    Exam:  General:  Caucasian female in no cardiopulmonary distress Cardiovascular:  school, and then a regular rhythm, no noted murmur-telemetry =Bradycardia to high 40's Respiratory: Rales right lower lung field Abdomen:  soft nontender nondistended Skin trace Lower extremity edema  Neuro grossly intact   Data Reviewed: Basic Metabolic Panel:  Lab 07/14/11 8119 07/13/11 1848 07/13/11 1300 07/07/11 1805 07/07/11 1750  NA 135 -- 138 143 142  K 3.8 2.9* -- -- --  CL 102 -- 99 105 103  CO2  25 -- 28 -- 23  GLUCOSE 190* -- 219* 181* 178*  BUN 14 -- 8 10 11   CREATININE 0.74 -- 0.63 0.60 0.67  CALCIUM 8.4 -- 8.9 -- 9.7  MG 1.4* -- -- -- --  PHOS -- -- -- -- --   Liver Function Tests:  Lab 07/13/11 1300 07/07/11 1750  AST 15 29  ALT 7 8  ALKPHOS 81 97  BILITOT 0.9 0.4  PROT 7.2 7.4  ALBUMIN 3.7 3.8   No results found for this basename: LIPASE:5,AMYLASE:5 in the last 168 hours No results found for this basename: AMMONIA:5 in the last 168 hours CBC:  Lab 07/14/11 0424 07/13/11 1300 07/07/11 1805 07/07/11 1750  WBC 7.9 7.8 -- 4.5  NEUTROABS -- 6.8 -- 2.6  HGB 10.4* 12.1 12.6 12.0  HCT 31.5* 35.9* 37.0 36.7  MCV 96.3 96.0 -- 95.8  PLT 124* 142* -- 144*   Cardiac Enzymes:  Lab 07/07/11 1750  CKTOTAL --  CKMB --  CKMBINDEX --  TROPONINI <0.30   BNP: No components found with this basename: POCBNP:5 CBG:  Lab 07/14/11 1207 07/14/11 0806 07/13/11 2115 07/13/11 1725  GLUCAP 214* 158* 225* 214*    Recent Results (from the past 240 hour(s))  MRSA PCR SCREENING     Status: Normal   Collection Time   07/13/11  7:00 PM      Component Value Range Status  Comment   MRSA by PCR NEGATIVE  NEGATIVE  Final      Studies: Dg Chest 2 View  06/23/2011  *RADIOLOGY REPORT*  Clinical Data: Chills and cough.  CHEST - 2 VIEW  Comparison: Chest radiograph performed 05/22/2011  Findings: The lungs are well-aerated.  Vascular congestion is noted; mild left basilar atelectasis or scarring is again seen, stable from the prior study.  Mild right mid lung airspace opacity could reflect mild pneumonia.  This no longer appears to reflect an overlying shadow.  There is no evidence of pleural effusion or pneumothorax.  The heart is enlarged; calcification is noted within the aortic arch.  No acute osseous abnormalities are seen.  Clips are noted within the right upper quadrant, reflecting prior cholecystectomy.  IMPRESSION:  1.  Mild right midlung airspace opacity could reflect mild pneumonia.  2.  Vascular congestion and cardiomegaly; mild left basilar atelectasis or scarring again seen.  Original Report Authenticated By: Tonia Ghent, M.D.   Dg Chest Portable 1 View  07/13/2011  *RADIOLOGY REPORT*  Clinical Data: Body aches, weakness, shortness of breath, low oxygen saturation, cough and fever  PORTABLE CHEST - 1 VIEW  Comparison: March 22, 2012  Findings: There is cardiomegaly and mild pulmonary edema, unchanged.   Patchy opacity in the left lower lung remains present and not significantly changed, likely atelectasis.  No effusions are identified.  IMPRESSION: Cardiomegaly and mild pulmonary edema.  Patchy left lower lung opacity is unchanged and is likely atelectasis.  Original Report Authenticated By: Brandon Melnick, M.D.    Scheduled Meds:   . cholecalciferol  1,000 Units Oral Daily  . clopidogrel  75 mg Oral Daily  . cyanocobalamin  500 mcg Oral Daily  . donepezil  5 mg Oral QHS  . dorzolamide  1 drop Both Eyes TID  . furosemide  20 mg Oral Daily  . gabapentin  100 mg Oral BID  . insulin aspart  0-15 Units Subcutaneous TID WC  . insulin aspart  0-5 Units Subcutaneous QHS  . ketorolac  15 mg Intravenous Once  . levofloxacin (LEVAQUIN) IV  750 mg Intravenous Q24H  . lisinopril  10 mg Oral Daily  . metoprolol tartrate  25 mg Oral BID  . multivitamin  1 tablet Oral Daily  . olopatadine  1 drop Both Eyes Daily  . pantoprazole  40 mg Oral Q1200  . piperacillin-tazobactam (ZOSYN)  IV  3.375 g Intravenous Once  . piperacillin-tazobactam (ZOSYN)  IV  3.375 g Intravenous Q8H  . polyvinyl alcohol  1 drop Both Eyes QID  . potassium chloride  10 mEq Intravenous Q1 Hr x 4  . potassium chloride SA  20 mEq Oral Daily  . protein supplement  1 scoop Oral BID  . sertraline  25 mg Oral Daily  . simvastatin  10 mg Oral q1800  . sodium chloride  3 mL Intravenous Q12H  . Travoprost (BAK Free)  1 drop Both Eyes QHS  . traZODone  100 mg Oral QHS  . vancomycin  750 mg Intravenous Q12H  .  vancomycin  1,000 mg Intravenous Once  . vitamin A & D      . DISCONTD: levofloxacin (LEVAQUIN) IV  500 mg Intravenous Q24H  . DISCONTD: piperacillin-tazobactam (ZOSYN)  IV  3.375 g Intravenous Q6H  . DISCONTD: Polyethyl Glycol-Propyl Glycol  1 drop Ophthalmic QID  . DISCONTD: polyvinyl alcohol  1 drop Both Eyes Daily  . DISCONTD: vancomycin  1 g Intravenous Once   Continuous Infusions:  Assessment/Plan: 1.  sepsis likely source pneumonia = continue Zosyn and vancomycin, CBC a.m., repeat chest x-ray today-awake urine culture to determine source although clinically more likely secondary to pneumonia-add flutter valve and Mucinex for production of sputum 2. CHF EF 55-60% with diastolic dysfunction-continue Lasix 20 by mouth, lisinopril 10, metoprolol 25 twice a day 3. Atrial fibrillation-continue beta blocker as above. Continue telemetry as bradycardic slightly at night.  Not on Coumadin due to fall risk 4. Diabetes mellitus type 2 (A1c 7.7]-acceptable control on sliding scale-158-214. 5. Pancreatic lesion?-Needs CT repeat as outpatient 6. Dementia-continue Aricept, trazodone for sleep 7. Anemia-one point dropped and blood count and slightly low platelets, continue cyanocobalamin reassess with blood count in a.m.  Code Status: Full Family Communication: Discussed with daughter at bedside plan of care Disposition Plan: Discharge back to skilled nursing facility when appropriate   Pleas Koch, MD  [facility] Pager [phone] 07/14/2011, 3:50 PM    LOS: 1 day

## 2011-07-14 NOTE — Evaluation (Signed)
Physical Therapy Evaluation Patient Details Name: Maria Christian MRN: [id number] DOB: 1924-12-27 Today's Date: 07/14/2011  Problem List:  Patient Active Problem List  Diagnoses  . Atrial fibrillation with controlled ventricular response  . Diastolic CHF, chronic  . Palpitations  . Pyuria  . Preventative health care  . Parkinson disease  . Hypertension  . GERD (gastroesophageal reflux disease)  . Hyperlipemia  . Diabetes mellitus  . Coronary artery disease  . History of adenomatous polyp of colon  . UTI (lower urinary tract infection)  . Pneumonia    Past Medical History:  Past Medical History  Diagnosis Date  . Atrial fibrillation   . Altered mental status   . Encephalopathy   . Parkinson disease   . Hypertension   . GERD (gastroesophageal reflux disease)   . Hyperlipemia   . Diabetes mellitus   . Coronary artery disease   . History of adenomatous polyp of colon 07/02/2011  . Enlarged heart   . DEMENTIA   . Edema of lower extremity 07/13/11    right leg more swollen than left leg   Past Surgical History:  Past Surgical History  Procedure Date  . Left shoulder surgury 2001    left clavicle excision and acromioplasty  . Abdominal hysterectomy   . Cholecystectomy   . Joint replacement     right hip  . Breast surgery     2 benign tumors removed left breast  . Foot surgery     benign tumors from foot  . Esophageal dilation     several times by Dr. Victorino Dike    PT Assessment/Plan/Recommendation PT Assessment Clinical Impression Statement: Pt presents with diagnosis of pneumonia. Pt will benefit from skilled PT in the acute care setting to improve strength and activity tolerance in preperation for D/C back to SNF.  PT Recommendation/Assessment: Patient will need skilled PT in the acute care venue PT Problem List: Decreased strength;Decreased activity tolerance;Decreased mobility;Decreased knowledge of use of DME;Pain PT Therapy Diagnosis : Generalized  weakness;Acute pain;Difficulty walking PT Plan PT Frequency: Min 2X/week PT Treatment/Interventions: DME instruction;Therapeutic exercise;Therapeutic activities;Functional mobility training;Patient/family education PT Recommendation Follow Up Recommendations: Skilled nursing facility Equipment Recommended: Defer to next venue PT Goals  Acute Rehab PT Goals PT Goal Formulation: With patient Time For Goal Achievement: 7 days Pt will go Supine/Side to Sit: with min assist PT Goal: Supine/Side to Sit - Progress: Goal set today Pt will go Sit to Supine/Side: with min assist PT Goal: Sit to Supine/Side - Progress: Goal set today Pt will go Sit to Stand: with mod assist PT Goal: Sit to Stand - Progress: Goal set today Pt will go Stand to Sit: with mod assist PT Goal: Stand to Sit - Progress: Goal set today Pt will Perform Home Exercise Program: with min assist PT Goal: Perform Home Exercise Program - Progress: Goal set today  PT Evaluation Precautions/Restrictions  Precautions Precautions: Fall Prior Functioning  Home Living Type of Home: Skilled Nursing Facility Home Adaptive Equipment: Wheelchair - manual Additional Comments: Pt stated she has not stood/walked in a while. After session, RN stated pt is wheelchair bound. Prior Function Level of Independence: Needs assistance with tranfers;Needs assistance with ADLs Cognition Cognition Arousal/Alertness: Lethargic Overall Cognitive Status: Impaired Memory: Appears impaired Orientation Level: Oriented to person;Oriented to place Sensation/Coordination Sensation Light Touch: Appears Intact Extremity Assessment RLE Strength RLE Overall Strength Comments: Hip flex 2/5, knee ext 3/5, DF/PF 3/5 LLE Strength LLE Overall Strength Comments: Hip flexion 2/5, knee ext 3/5, DF/PF 3/5  Mobility (including Balance) Bed Mobility Bed Mobility: Yes Rolling Right: 3: Mod assist Rolling Right Details (indicate cue type and reason): VCs safety,  technique, hand placement. Assist and bedpad needed to complete task. Pt c/o pain/soreness.  Rolling Left: 3: Mod assist Rolling Left Details (indicate cue type and reason): VCs safety, technique, hand placement. Assist and bedpad needed to complete task. Pt c/o pain/soreness Supine to Sit: 1: +2 Total assist;HOB elevated (Comment degrees);With rails (Pt=50%) Supine to Sit Details (indicate cue type and reason): VCs safety, technique, hand placement. Assist for trunk to upright and bil LEs off EOB.  Sit to Supine: 1: +2 Total assist;HOB flat;With rail Sit to Supine - Details (indicate cue type and reason): VCs safety, technique, hand placement. Assist for trunk to supine and bil LEs onto bed.  Transfers Transfers: Yes Sit to Stand: 1: +2 Total assist;From bed;From elevated surface;With upper extremity assist (Pt=50%) Sit to Stand Details (indicate cue type and reason): VCs safety, technique, hand placement. Assist to rise, stabilize.  Stand to Sit: 1: +2 Total assist;To bed (Pt=50%) Ambulation/Gait Ambulation/Gait: No (pt unable)  Posture/Postural Control Posture/Postural Control: No significant limitations Exercise    End of Session PT - End of Session Equipment Utilized During Treatment: Gait belt Activity Tolerance: Patient limited by pain;Patient limited by fatigue Patient left: in bed;with call bell in reach General Behavior During Session: Lethargic Cognition: Impaired, at baseline  Maria Christian Alert [facility] 07/14/2011, 1:22 PM [phone]

## 2011-07-15 DIAGNOSIS — I4891 Unspecified atrial fibrillation: Secondary | ICD-10-CM | POA: Diagnosis not present

## 2011-07-15 DIAGNOSIS — E782 Mixed hyperlipidemia: Secondary | ICD-10-CM | POA: Diagnosis not present

## 2011-07-15 DIAGNOSIS — E876 Hypokalemia: Secondary | ICD-10-CM | POA: Diagnosis not present

## 2011-07-15 DIAGNOSIS — D696 Thrombocytopenia, unspecified: Secondary | ICD-10-CM | POA: Diagnosis not present

## 2011-07-15 LAB — CULTURE, BLOOD (ROUTINE X 2): Culture  Setup Time: 201302080035

## 2011-07-15 LAB — CBC
MCH: 31.6 pg (ref 26.0–34.0)
MCV: 96.9 fL (ref 78.0–100.0)
Platelets: 116 10*3/uL — ABNORMAL LOW (ref 150–400)
RDW: 14.5 % (ref 11.5–15.5)

## 2011-07-15 LAB — URINE CULTURE
Colony Count: >=100000
Culture  Setup Time: 201302080108

## 2011-07-15 LAB — VANCOMYCIN, TROUGH: Vancomycin Tr: 12.7 ug/mL (ref 10.0–20.0)

## 2011-07-15 LAB — BASIC METABOLIC PANEL
CO2: 26 mEq/L (ref 19–32)
Calcium: 8.6 mg/dL (ref 8.4–10.5)
Creatinine, Ser: 0.74 mg/dL (ref 0.50–1.10)
GFR calc Af Amer: 88 mL/min — ABNORMAL LOW (ref >90)

## 2011-07-15 LAB — GLUCOSE, CAPILLARY
Glucose-Capillary: 156 mg/dL — ABNORMAL HIGH (ref 70–99)
Glucose-Capillary: 192 mg/dL — ABNORMAL HIGH (ref 70–99)
Glucose-Capillary: 171 mg/dL — ABNORMAL HIGH (ref 70–99)

## 2011-07-15 NOTE — Progress Notes (Signed)
Pt had 3 beats VT and a brady episode to 38 but not sustained. Pt asymtomatic. Will continue to monitor. Julio Sicks RN

## 2011-07-15 NOTE — Progress Notes (Signed)
Patient weaned off oxygen per doctor's orders. O2 saturation was 97% on room air. Will continue to monitor. Chana Bode, Student Nurse Jodi Marble, RN

## 2011-07-15 NOTE — Progress Notes (Signed)
ANTIBIOTIC CONSULT NOTE - INITIAL  Pharmacy Consult for Vancomycin Indication: UTI, HCAP  No Known Allergies  Patient Measurements: Height: 5\' 6"  (167.6 cm) Weight: 169 lb 12.1 oz (77 kg) IBW/kg (Calculated) : 59.3   Vital Signs: Temp: 98.8 F (37.1 C) (02/09 1453) Temp src: Oral (02/09 1453) BP: 145/71 mmHg (02/09 1453) Pulse Rate: 70  (02/09 1453)  Labs:  Basename 07/15/11 0530 07/14/11 0424 07/13/11 1300  WBC 7.2 7.9 7.8  HGB 10.1* 10.4* 12.1  PLT 116* 124* 142*  LABCREA -- -- --  CREATININE 0.74 0.74 0.63   Estimated Creatinine Clearance: 54.9 ml/min (by C-G formula based on Cr of 0.74).    Microbiology: Recent Results (from the past 720 hour(s))  CULTURE, BLOOD (ROUTINE X 2)     Status: Normal   Collection Time   07/13/11  1:00 PM      Component Value Range Status Comment   Specimen Description BLOOD RIGHT ANTECUBITAL   Final    Special Requests BOTTLES DRAWN AEROBIC AND ANAEROBIC   Final    Culture  Setup Time [id number]   Final    Culture     Final    Value: STAPHYLOCOCCUS SPECIES (COAGULASE NEGATIVE)     Note: THE SIGNIFICANCE OF ISOLATING THIS ORGANISM FROM A SINGLE SET OF BLOOD CULTURES WHEN MULTIPLE SETS ARE DRAWN IS UNCERTAIN. PLEASE NOTIFY THE MICROBIOLOGY DEPARTMENT WITHIN ONE WEEK IF SPECIATION AND SENSITIVITIES ARE REQUIRED.     Note: Gram Stain Report Called to,Read Back By and Verified With: SHARLA ROBBS @ 2130 ON 07/14/11 BY GOLLD   Report Status 07/15/2011 FINAL   Final   CULTURE, BLOOD (ROUTINE X 2)     Status: Normal (Preliminary result)   Collection Time   07/13/11  1:05 PM      Component Value Range Status Comment   Specimen Description BLOOD LEFT ANTECUBITAL   Final    Special Requests BOTTLES DRAWN AEROBIC AND ANAEROBIC   Final    Culture  Setup Time [id number]   Final    Culture     Final    Value:        BLOOD CULTURE RECEIVED NO GROWTH TO DATE CULTURE WILL BE HELD FOR 5 DAYS BEFORE ISSUING A FINAL NEGATIVE REPORT   Report Status  PENDING   Incomplete   URINE CULTURE     Status: Normal   Collection Time   07/13/11  1:29 PM      Component Value Range Status Comment   Specimen Description URINE, CATHETERIZED   Final    Special Requests NONE   Final    Culture  Setup Time [id number]   Final    Colony Count >=100,000 COLONIES/ML   Final    Culture ENTEROCOCCUS SPECIES   Final    Report Status 07/15/2011 FINAL   Final    Organism ID, Bacteria ENTEROCOCCUS SPECIES   Final   MRSA PCR SCREENING     Status: Normal   Collection Time   07/13/11  7:00 PM      Component Value Range Status Comment   MRSA by PCR NEGATIVE  NEGATIVE  Final      Medications:  Scheduled:     . cholecalciferol  1,000 Units Oral Daily  . clopidogrel  75 mg Oral Daily  . cyanocobalamin  500 mcg Oral Daily  . dextromethorphan-guaiFENesin  1 tablet Oral BID  . donepezil  5 mg Oral QHS  . dorzolamide  1 drop Both Eyes TID  .  furosemide  20 mg Oral Daily  . gabapentin  100 mg Oral BID  . insulin aspart  0-15 Units Subcutaneous TID WC  . insulin aspart  0-5 Units Subcutaneous QHS  . levofloxacin (LEVAQUIN) IV  750 mg Intravenous Q24H  . lisinopril  10 mg Oral Daily  . metoprolol tartrate  25 mg Oral BID  . multivitamin  1 tablet Oral Daily  . olopatadine  1 drop Both Eyes Daily  . pantoprazole  40 mg Oral Q1200  . piperacillin-tazobactam (ZOSYN)  IV  3.375 g Intravenous Q8H  . polyvinyl alcohol  1 drop Both Eyes QID  . potassium chloride SA  20 mEq Oral Daily  . protein supplement  1 scoop Oral BID  . sertraline  25 mg Oral Daily  . simvastatin  10 mg Oral q1800  . sodium chloride  3 mL Intravenous Q12H  . Travoprost (BAK Free)  1 drop Both Eyes QHS  . traZODone  100 mg Oral QHS  . vancomycin  750 mg Intravenous Q12H    Assessment:  78 yo F admit with UTI and covering for HCAP  D3 Levaquin and Zosyn, vancomycin  Urine Culture >100,000 Enterococcus (Sens to Ampicillin, NTF, Tetracycline, Vanc)  Blood culture 1/2 Coag neg  staphylococcus  Vancomycin trough level low at 12.7  Goal of Therapy:  Vancomycin trough level 15-20 mcg/ml  Plan:   Continue antibiotics as ordered, consider narrowing spectrum  Follow labs, vitals and cultures  Adjust dose as appropriate   Lynann Beaver PharmD  Pager [phone] 07/15/2011 6:43 PM

## 2011-07-15 NOTE — Progress Notes (Signed)
PROGRESS NOTE  Maria Christian AVW:[id number] DOB: 22-Nov-1924 DOA: 07/13/2011 PCP: Florentina Jenny, MD, MD  Brief narrative: 77 year old Caucasian female admitted with likely UTI, but covered with HCAP coverage as had cough/sputum  Past medical history: Atrial fibrillation with Patient [place] score 5, CHF-EF 55-60% (grade 2 diastolic dysfunction) diabetes mellitus (A1c 7.7), possible pancreatic lesion (needs Rpt Ct 3-4 mo)   Consultants:  None  Procedures:  07/13/11 = patchy left lower lung opacity, likely atelectasis  Urine Culture >100,000 CFU E.coli, sensit pending  Blood culture 1/2 Gram + cocci=Coag neg staphylococcus  Antibiotics:  Zosyn 2/7 >>  Vancomycin 2/7 >>  Levofloxacin 2/7 >>  7Subjective  Did not to sleep well last night, complains of R neck pain relieved by analgesics Productive sputum with red tinge. Also complains of lower extremity pain. No shortness of breath.  Seems much more alert than yesterday and more oriented.   Objective    Interim History: Chart Reviewed H&P reviewed  Objective: Filed Vitals:   07/13/11 2119 07/14/11 1348 07/14/11 2152 07/15/11 0537  BP: 127/73 122/66 144/69 124/70  Pulse: 73 67 67 74  Temp: 99.1 F (37.3 C) 98.2 F (36.8 C) 98.4 F (36.9 C) 99.9 F (37.7 C)  TempSrc: Axillary Oral Oral Oral  Resp: 18 18 18 17   Height:      Weight:      SpO2: 98% 97% 98% 98%    Intake/Output Summary (Last 24 hours) at 07/15/11 1326 Last data filed at 07/15/11 1201  Gross per 24 hour  Intake 1062.5 ml  Output    350 ml  Net  712.5 ml    Exam:  General:  Caucasian female in no cardiopulmonary distress Cardiovascular: regular rhythm, ? Murmur LLSE-telemetry =Bradycardia to high 40's with 3 bts Vtach Respiratory: Rales bilat lower lung fields Abdomen:  soft nontender nondistended Skin trace Lower extremity edema  Neuro grossly intact   Data Reviewed: Basic Metabolic Panel:  Lab 07/15/11 8295 07/14/11 0424 07/13/11 1300  NA  138 135 138  K 3.8 3.8 --  CL 104 102 99  CO2 26 25 28   GLUCOSE 167* 190* 219*  BUN 15 14 8   CREATININE 0.74 0.74 0.63  CALCIUM 8.6 8.4 8.9  MG -- 1.4* --  PHOS -- -- --   Liver Function Tests:  Lab 07/13/11 1300  AST 15  ALT 7  ALKPHOS 81  BILITOT 0.9  PROT 7.2  ALBUMIN 3.7   No results found for this basename: LIPASE:5,AMYLASE:5 in the last 168 hours No results found for this basename: AMMONIA:5 in the last 168 hours CBC:  Lab 07/15/11 0530 07/14/11 0424 07/13/11 1300  WBC 7.2 7.9 7.8  NEUTROABS -- -- 6.8  HGB 10.1* 10.4* 12.1  HCT 31.0* 31.5* 35.9*  MCV 96.9 96.3 96.0  PLT 116* 124* 142*   Cardiac Enzymes: No results found for this basename: CKTOTAL:5,CKMB:5,CKMBINDEX:5,TROPONINI:5 in the last 168 hours BNP: No components found with this basename: POCBNP:5 CBG:  Lab 07/15/11 1141 07/15/11 0807 07/14/11 2117 07/14/11 1725 07/14/11 1207  GLUCAP 192* 156* 232* 171* 214*    Recent Results (from the past 240 hour(s))  CULTURE, BLOOD (ROUTINE X 2)     Status: Normal   Collection Time   07/13/11  1:00 PM      Component Value Range Status Comment   Specimen Description BLOOD RIGHT ANTECUBITAL   Final    Special Requests BOTTLES DRAWN AEROBIC AND ANAEROBIC   Final    Culture  Setup Time [id number]  Final    Culture     Final    Value: STAPHYLOCOCCUS SPECIES (COAGULASE NEGATIVE)     Note: THE SIGNIFICANCE OF ISOLATING THIS ORGANISM FROM A SINGLE SET OF BLOOD CULTURES WHEN MULTIPLE SETS ARE DRAWN IS UNCERTAIN. PLEASE NOTIFY THE MICROBIOLOGY DEPARTMENT WITHIN ONE WEEK IF SPECIATION AND SENSITIVITIES ARE REQUIRED.     Note: Gram Stain Report Called to,Read Back By and Verified With: SHARLA ROBBS @ 2130 ON 07/14/11 BY GOLLD   Report Status 07/15/2011 FINAL   Final   CULTURE, BLOOD (ROUTINE X 2)     Status: Normal (Preliminary result)   Collection Time   07/13/11  1:05 PM      Component Value Range Status Comment   Specimen Description BLOOD LEFT ANTECUBITAL   Final      Special Requests BOTTLES DRAWN AEROBIC AND ANAEROBIC   Final    Culture  Setup Time [id number]   Final    Culture     Final    Value:        BLOOD CULTURE RECEIVED NO GROWTH TO DATE CULTURE WILL BE HELD FOR 5 DAYS BEFORE ISSUING A FINAL NEGATIVE REPORT   Report Status PENDING   Incomplete   URINE CULTURE     Status: Normal (Preliminary result)   Collection Time   07/13/11  1:29 PM      Component Value Range Status Comment   Specimen Description URINE, CATHETERIZED   Final    Special Requests NONE   Final    Culture  Setup Time [id number]   Final    Colony Count >=100,000 COLONIES/ML   Final    Culture ENTEROCOCCUS SPECIES   Final    Report Status PENDING   Incomplete   MRSA PCR SCREENING     Status: Normal   Collection Time   07/13/11  7:00 PM      Component Value Range Status Comment   MRSA by PCR NEGATIVE  NEGATIVE  Final      Studies: Dg Chest 2 View  06/23/2011  *RADIOLOGY REPORT*  Clinical Data: Chills and cough.  CHEST - 2 VIEW  Comparison: Chest radiograph performed 05/22/2011  Findings: The lungs are well-aerated.  Vascular congestion is noted; mild left basilar atelectasis or scarring is again seen, stable from the prior study.  Mild right mid lung airspace opacity could reflect mild pneumonia.  This no longer appears to reflect an overlying shadow.  There is no evidence of pleural effusion or pneumothorax.  The heart is enlarged; calcification is noted within the aortic arch.  No acute osseous abnormalities are seen.  Clips are noted within the right upper quadrant, reflecting prior cholecystectomy.  IMPRESSION:  1.  Mild right midlung airspace opacity could reflect mild pneumonia. 2.  Vascular congestion and cardiomegaly; mild left basilar atelectasis or scarring again seen.  Original Report Authenticated By: Tonia Ghent, M.D.   Dg Chest Portable 1 View  07/13/2011  *RADIOLOGY REPORT*  Clinical Data: Body aches, weakness, shortness of breath, low oxygen saturation, cough  and fever  PORTABLE CHEST - 1 VIEW  Comparison: March 22, 2012  Findings: There is cardiomegaly and mild pulmonary edema, unchanged.   Patchy opacity in the left lower lung remains present and not significantly changed, likely atelectasis.  No effusions are identified.  IMPRESSION: Cardiomegaly and mild pulmonary edema.  Patchy left lower lung opacity is unchanged and is likely atelectasis.  Original Report Authenticated By: Brandon Melnick, M.D.    Scheduled Meds:    .  cholecalciferol  1,000 Units Oral Daily  . clopidogrel  75 mg Oral Daily  . cyanocobalamin  500 mcg Oral Daily  . dextromethorphan-guaiFENesin  1 tablet Oral BID  . donepezil  5 mg Oral QHS  . dorzolamide  1 drop Both Eyes TID  . furosemide  20 mg Oral Daily  . gabapentin  100 mg Oral BID  . insulin aspart  0-15 Units Subcutaneous TID WC  . insulin aspart  0-5 Units Subcutaneous QHS  . levofloxacin (LEVAQUIN) IV  750 mg Intravenous Q24H  . lisinopril  10 mg Oral Daily  . metoprolol tartrate  25 mg Oral BID  . multivitamin  1 tablet Oral Daily  . olopatadine  1 drop Both Eyes Daily  . pantoprazole  40 mg Oral Q1200  . piperacillin-tazobactam (ZOSYN)  IV  3.375 g Intravenous Q8H  . polyvinyl alcohol  1 drop Both Eyes QID  . potassium chloride SA  20 mEq Oral Daily  . protein supplement  1 scoop Oral BID  . sertraline  25 mg Oral Daily  . simvastatin  10 mg Oral q1800  . sodium chloride  3 mL Intravenous Q12H  . Travoprost (BAK Free)  1 drop Both Eyes QHS  . traZODone  100 mg Oral QHS  . vancomycin  750 mg Intravenous Q12H  . DISCONTD: piperacillin-tazobactam (ZOSYN)  IV  3.375 g Intravenous Q6H   Continuous Infusions:    . sodium chloride 50 mL/hr at 07/14/11 2345     Assessment/Plan: 1. Sepsis likely source E.coli >100,000 UTI>>pneumonia = continue Zosyn and vancomycin/Levaquin, CBC is at baseline-await urine culture to determine source, continue flutter valve and Mucinex for production of sputum. 2. Red  sputum and "blood" from nares.  Will d/c oxygen-states she was never a smoker, but her husband was until they divorced -CXR shows a scar. If she has recurrent bleeding, would consider getting a CT chest to rule out any other consideration such as Cancer etc. Will add Flonase and see if this helps out.  3. CHF EF 55-60% with diastolic dysfunction-continue Lasix 20 by mouth, lisinopril 10, metoprolol 25 twice a day.  Saline lock IVF as getting slightly volume overloaded based on rales by exam 4. Atrial fibrillation-continue beta blocker as above. Continue telemetry as bradycardic slightly at night.  Not on Coumadin due to fall risk 5. Diabetes mellitus type 2 (A1c 7.7)-acceptable control on sliding scale-156-192 6. Pancreatic lesion?-Needs CT repeat as outpatient 7. Dementia-continue Aricept, trazodone for sleep 8. Anemia-one point dropped and blood count and slightly low platelets, continue cyanocobalamin reassess with blood count in a.m. 9. TCP-no source for this.  Could be dilutional effect of IVF.  Seem chronic from   Code Status: Full. Family Communication: Discussed with daughter plan of care-all questions answered. Disposition Plan: Discharge back to skilled nursing facility when appropriate.  Pleas Koch, MD  [facility] Pager [phone] 07/15/2011, 1:26 PM    LOS: 2 days

## 2011-07-16 ENCOUNTER — Inpatient Hospital Stay (HOSPITAL_COMMUNITY): Payer: Medicare Other

## 2011-07-16 DIAGNOSIS — M19019 Primary osteoarthritis, unspecified shoulder: Secondary | ICD-10-CM | POA: Diagnosis not present

## 2011-07-16 DIAGNOSIS — D696 Thrombocytopenia, unspecified: Secondary | ICD-10-CM | POA: Diagnosis not present

## 2011-07-16 DIAGNOSIS — E876 Hypokalemia: Secondary | ICD-10-CM | POA: Diagnosis not present

## 2011-07-16 DIAGNOSIS — I4891 Unspecified atrial fibrillation: Secondary | ICD-10-CM | POA: Diagnosis not present

## 2011-07-16 DIAGNOSIS — M25519 Pain in unspecified shoulder: Secondary | ICD-10-CM | POA: Diagnosis not present

## 2011-07-16 DIAGNOSIS — E782 Mixed hyperlipidemia: Secondary | ICD-10-CM | POA: Diagnosis not present

## 2011-07-16 LAB — GLUCOSE, CAPILLARY
Glucose-Capillary: 223 mg/dL — ABNORMAL HIGH (ref 70–99)
Glucose-Capillary: 148 mg/dL — ABNORMAL HIGH (ref 70–99)
Glucose-Capillary: 177 mg/dL — ABNORMAL HIGH (ref 70–99)

## 2011-07-16 NOTE — Progress Notes (Signed)
PROGRESS NOTE  Maria Christian ZOX:[id number] DOB: 07-20-1924 DOA: 07/13/2011 PCP: Florentina Jenny, MD, MD  Brief narrative: 78 year old Caucasian female admitted with likely UTI, but covered with HCAP coverage as had cough/sputum  Past medical history: Atrial fibrillation with Patient [place] score 5, CHF-EF 55-60% (grade 2 diastolic dysfunction) diabetes mellitus (A1c 7.7), possible pancreatic lesion (needs Rpt Ct 3-4 mo)   Consultants:  None  Procedures:  07/13/11 = patchy left lower lung opacity, likely atelectasis  Urine Culture >100,000 CFU E.coli, sensit pending  Blood culture 1/2 Gram + cocci=Coag neg staphylococcus  Antibiotics:  Zosyn 2/7 >>  Vancomycin 2/7 >>  Levofloxacin 2/7 >>  7Subjective   Cough up less blood tinged sputum   Objective    Interim History: Chart Reviewed H&P reviewed  Objective: Filed Vitals:   07/15/11 1453 07/15/11 2209 07/16/11 0610 07/16/11 1236  BP: 145/71 152/81 119/74 131/80  Pulse: 70 81 83 53  Temp: 98.8 F (37.1 C) 98.5 F (36.9 C) 98.8 F (37.1 C) 98.2 F (36.8 C)  TempSrc: Oral Oral Oral Oral  Resp: 18 18 18 18   Height:      Weight:      SpO2: 96% 95% 91% 100%    Intake/Output Summary (Last 24 hours) at 07/16/11 1245 Last data filed at 07/16/11 1235  Gross per 24 hour  Intake   1655 ml  Output   1750 ml  Net    -95 ml    Exam:  General:  Caucasian female in no cardiopulmonary distress Cardiovascular: regular rhythm Respiratory: Clear  bilat lower lung fields Abdomen:  soft nontender nondistended Skin trace Lower extremity edema  Neuro grossly intact   Data Reviewed: Basic Metabolic Panel:  Lab 07/15/11 8119 07/14/11 0424 07/13/11 1300  NA 138 135 138  K 3.8 3.8 --  CL 104 102 99  CO2 26 25 28   GLUCOSE 167* 190* 219*  BUN 15 14 8   CREATININE 0.74 0.74 0.63  CALCIUM 8.6 8.4 8.9  MG -- 1.4* --  PHOS -- -- --   Liver Function Tests:  Lab 07/13/11 1300  AST 15  ALT 7  ALKPHOS 81  BILITOT 0.9    PROT 7.2  ALBUMIN 3.7   No results found for this basename: LIPASE:5,AMYLASE:5 in the last 168 hours No results found for this basename: AMMONIA:5 in the last 168 hours CBC:  Lab 07/15/11 0530 07/14/11 0424 07/13/11 1300  WBC 7.2 7.9 7.8  NEUTROABS -- -- 6.8  HGB 10.1* 10.4* 12.1  HCT 31.0* 31.5* 35.9*  MCV 96.9 96.3 96.0  PLT 116* 124* 142*   Cardiac Enzymes: No results found for this basename: CKTOTAL:5,CKMB:5,CKMBINDEX:5,TROPONINI:5 in the last 168 hours BNP: No components found with this basename: POCBNP:5 CBG:  Lab 07/16/11 1145 07/15/11 2202 07/15/11 1638 07/15/11 1141 07/15/11 0807  GLUCAP 223* 189* 171* 192* 156*    Recent Results (from the past 240 hour(s))  CULTURE, BLOOD (ROUTINE X 2)     Status: Normal   Collection Time   07/13/11  1:00 PM      Component Value Range Status Comment   Specimen Description BLOOD RIGHT ANTECUBITAL   Final    Special Requests BOTTLES DRAWN AEROBIC AND ANAEROBIC   Final    Culture  Setup Time [id number]   Final    Culture     Final    Value: STAPHYLOCOCCUS SPECIES (COAGULASE NEGATIVE)     Note: THE SIGNIFICANCE OF ISOLATING THIS ORGANISM FROM A SINGLE SET OF BLOOD CULTURES WHEN  MULTIPLE SETS ARE DRAWN IS UNCERTAIN. PLEASE NOTIFY THE MICROBIOLOGY DEPARTMENT WITHIN ONE WEEK IF SPECIATION AND SENSITIVITIES ARE REQUIRED.     Note: Gram Stain Report Called to,Read Back By and Verified With: SHARLA ROBBS @ 2130 ON 07/14/11 BY GOLLD   Report Status 07/15/2011 FINAL   Final   CULTURE, BLOOD (ROUTINE X 2)     Status: Normal (Preliminary result)   Collection Time   07/13/11  1:05 PM      Component Value Range Status Comment   Specimen Description BLOOD LEFT ANTECUBITAL   Final    Special Requests BOTTLES DRAWN AEROBIC AND ANAEROBIC   Final    Culture  Setup Time [id number]   Final    Culture     Final    Value:        BLOOD CULTURE RECEIVED NO GROWTH TO DATE CULTURE WILL BE HELD FOR 5 DAYS BEFORE ISSUING A FINAL NEGATIVE REPORT    Report Status PENDING   Incomplete   URINE CULTURE     Status: Normal   Collection Time   07/13/11  1:29 PM      Component Value Range Status Comment   Specimen Description URINE, CATHETERIZED   Final    Special Requests NONE   Final    Culture  Setup Time [id number]   Final    Colony Count >=100,000 COLONIES/ML   Final    Culture ENTEROCOCCUS SPECIES   Final    Report Status 07/15/2011 FINAL   Final    Organism ID, Bacteria ENTEROCOCCUS SPECIES   Final   MRSA PCR SCREENING     Status: Normal   Collection Time   07/13/11  7:00 PM      Component Value Range Status Comment   MRSA by PCR NEGATIVE  NEGATIVE  Final      Studies: Dg Chest 2 View  06/23/2011  *RADIOLOGY REPORT*  Clinical Data: Chills and cough.  CHEST - 2 VIEW  Comparison: Chest radiograph performed 05/22/2011  Findings: The lungs are well-aerated.  Vascular congestion is noted; mild left basilar atelectasis or scarring is again seen, stable from the prior study.  Mild right mid lung airspace opacity could reflect mild pneumonia.  This no longer appears to reflect an overlying shadow.  There is no evidence of pleural effusion or pneumothorax.  The heart is enlarged; calcification is noted within the aortic arch.  No acute osseous abnormalities are seen.  Clips are noted within the right upper quadrant, reflecting prior cholecystectomy.  IMPRESSION:  1.  Mild right midlung airspace opacity could reflect mild pneumonia. 2.  Vascular congestion and cardiomegaly; mild left basilar atelectasis or scarring again seen.  Original Report Authenticated By: Tonia Ghent, M.D.   Dg Chest Portable 1 View  07/13/2011  *RADIOLOGY REPORT*  Clinical Data: Body aches, weakness, shortness of breath, low oxygen saturation, cough and fever  PORTABLE CHEST - 1 VIEW  Comparison: March 22, 2012  Findings: There is cardiomegaly and mild pulmonary edema, unchanged.   Patchy opacity in the left lower lung remains present and not significantly changed, likely  atelectasis.  No effusions are identified.  IMPRESSION: Cardiomegaly and mild pulmonary edema.  Patchy left lower lung opacity is unchanged and is likely atelectasis.  Original Report Authenticated By: Brandon Melnick, M.D.    Scheduled Meds:    . cholecalciferol  1,000 Units Oral Daily  . clopidogrel  75 mg Oral Daily  . cyanocobalamin  500 mcg Oral Daily  . dextromethorphan-guaiFENesin  1 tablet Oral BID  . donepezil  5 mg Oral QHS  . dorzolamide  1 drop Both Eyes TID  . furosemide  20 mg Oral Daily  . gabapentin  100 mg Oral BID  . insulin aspart  0-15 Units Subcutaneous TID WC  . insulin aspart  0-5 Units Subcutaneous QHS  . levofloxacin (LEVAQUIN) IV  750 mg Intravenous Q24H  . lisinopril  10 mg Oral Daily  . metoprolol tartrate  25 mg Oral BID  . multivitamin  1 tablet Oral Daily  . olopatadine  1 drop Both Eyes Daily  . pantoprazole  40 mg Oral Q1200  . piperacillin-tazobactam (ZOSYN)  IV  3.375 g Intravenous Q8H  . polyvinyl alcohol  1 drop Both Eyes QID  . potassium chloride SA  20 mEq Oral Daily  . protein supplement  1 scoop Oral BID  . sertraline  25 mg Oral Daily  . simvastatin  10 mg Oral q1800  . sodium chloride  3 mL Intravenous Q12H  . Travoprost (BAK Free)  1 drop Both Eyes QHS  . traZODone  100 mg Oral QHS  . vancomycin  750 mg Intravenous Q12H   Continuous Infusions:    . DISCONTD: sodium chloride Stopped (07/15/11 1330)     Assessment/Plan: 1. Sepsis likely source E.coli >100,000 UTI>>pneumonia = continue Zosyn and vancomycin/Levaquin,  2. Diastolic dysfunction-continue Lasix 20 by mouth, lisinopril 10, metoprolol 25 twice a day.   3. Atrial fibrillation-continue beta blocker as above. Continue telemetry as bradycardic slightly at night.  Not on Coumadin due to fall risk 4. Diabetes mellitus type 2 (A1c 7.7)-acceptable control on sliding scale-156-192 5. Pancreatic lesion?-Needs CT repeat as outpatient 6. Dementia-continue Aricept, trazodone for  sleep 7. Anemia-one point dropped and blood count and slightly low platelets, continue cyanocobalamin reassess with blood count in a.m. Code Status: Full. Family Communication: Discussed with daughter plan of care-all questions answered. Disposition Plan: Discharge back to skilled nursing facility when appropriate.  Molli Posey, MD  [facility] Pager [phone] 07/16/2011, 12:45 PM    LOS: 3 days

## 2011-07-16 NOTE — Progress Notes (Signed)
NT alerted RN to the fact that pt has moderate amount of blood coming from peri area. Upon assessment, not certain if the blood is coming from the urethra or vagina. After cleaning the pt, no apparent blood was leaking or oozing from the pt. Foley still appears to be intact. Also, pt continues to have productive cough with moderate amounts of blood tinged sputum. Will continue to monitor pt. Maria Christian

## 2011-07-17 DIAGNOSIS — R1310 Dysphagia, unspecified: Secondary | ICD-10-CM | POA: Diagnosis not present

## 2011-07-17 DIAGNOSIS — K8689 Other specified diseases of pancreas: Secondary | ICD-10-CM | POA: Diagnosis present

## 2011-07-17 DIAGNOSIS — E876 Hypokalemia: Secondary | ICD-10-CM | POA: Diagnosis not present

## 2011-07-17 DIAGNOSIS — I4891 Unspecified atrial fibrillation: Secondary | ICD-10-CM | POA: Diagnosis not present

## 2011-07-17 DIAGNOSIS — D696 Thrombocytopenia, unspecified: Secondary | ICD-10-CM | POA: Diagnosis not present

## 2011-07-17 DIAGNOSIS — E782 Mixed hyperlipidemia: Secondary | ICD-10-CM | POA: Diagnosis not present

## 2011-07-17 DIAGNOSIS — M25519 Pain in unspecified shoulder: Secondary | ICD-10-CM | POA: Diagnosis present

## 2011-07-17 LAB — BASIC METABOLIC PANEL
BUN: 14 mg/dL (ref 6–23)
Chloride: 104 mEq/L (ref 96–112)
GFR calc non Af Amer: 64 mL/min — ABNORMAL LOW (ref >90)
Glucose, Bld: 154 mg/dL — ABNORMAL HIGH (ref 70–99)
Potassium: 3 mEq/L — ABNORMAL LOW (ref 3.5–5.1)
Sodium: 140 mEq/L (ref 135–145)

## 2011-07-17 LAB — GLUCOSE, CAPILLARY
Glucose-Capillary: 245 mg/dL — ABNORMAL HIGH (ref 70–99)
Glucose-Capillary: 142 mg/dL — ABNORMAL HIGH (ref 70–99)

## 2011-07-17 LAB — CBC
HCT: 30.1 % — ABNORMAL LOW (ref 36.0–46.0)
Hemoglobin: 9.7 g/dL — ABNORMAL LOW (ref 12.0–15.0)
RBC: 3.13 MIL/uL — ABNORMAL LOW (ref 3.87–5.11)
WBC: 5 10*3/uL (ref 4.0–10.5)

## 2011-07-17 MED ORDER — POTASSIUM CHLORIDE CRYS ER 20 MEQ PO TBCR
40.0000 meq | EXTENDED_RELEASE_TABLET | Freq: Once | ORAL | Status: AC
  Administered 2011-07-17: 40 meq via ORAL
  Filled 2011-07-17: qty 2

## 2011-07-17 MED ORDER — DOXYCYCLINE HYCLATE 100 MG PO TABS
100.0000 mg | ORAL_TABLET | Freq: Two times a day (BID) | ORAL | Status: DC
  Administered 2011-07-17 – 2011-07-20 (×7): 100 mg via ORAL
  Filled 2011-07-17 (×8): qty 1

## 2011-07-17 MED ORDER — LEVOFLOXACIN 500 MG PO TABS
500.0000 mg | ORAL_TABLET | Freq: Every day | ORAL | Status: DC
  Administered 2011-07-17: 500 mg via ORAL
  Filled 2011-07-17: qty 1

## 2011-07-17 NOTE — Progress Notes (Signed)
PROGRESS NOTE  Maria Christian ZOX:[id number] DOB: 1925-01-23 DOA: 07/13/2011 PCP: Florentina Jenny, MD, MD  Brief narrative: 78 year old Caucasian female admitted with likely UTI, but covered with HCAP coverage as had cough/sputum  Past medical history: Atrial fibrillation with Patient [place] score 5, CHF-EF 55-60% (grade 2 diastolic dysfunction) diabetes mellitus (A1c 7.7), possible pancreatic lesion (needs Rpt Ct 3-4 mo)   Consultants:  None  Procedures:  07/13/11 = patchy left lower lung opacity, likely atelectasis  Urine Culture >100,000 CFU E.coli, sensit pending  Blood culture 1/2 Gram + cocci=Coag neg staphylococcus  Antibiotics:  Zosyn 2/7 >>2/11  Vancomycin 2/7 >>2/11  Levofloxacin 2/7 >>2/11  Doxycycline 2/11>  7Subjective   Patient states that she just does not feel well today. Discussed with her the pancreatic lesion. She states that she has discussed this with her primary care physician in the past and they have decided no further workup.   Objective    Interim History: Chart Reviewed H&P reviewed  Objective: Filed Vitals:   07/16/11 0610 07/16/11 1236 07/16/11 2028 07/17/11 0534  BP: 119/74 131/80 162/65 126/68  Pulse: 83 53 67 56  Temp: 98.8 F (37.1 C) 98.2 F (36.8 C) 99 F (37.2 C) 98 F (36.7 C)  TempSrc: Oral Oral Oral Oral  Resp: 18 18 19 19   Height:      Weight:      SpO2: 91% 100% 99% 96%    Intake/Output Summary (Last 24 hours) at 07/17/11 1224 Last data filed at 07/17/11 0900  Gross per 24 hour  Intake    960 ml  Output    450 ml  Net    510 ml    Exam:  General:  Caucasian female in no cardiopulmonary distress Cardiovascular: regular rhythm Respiratory: Clear  bilat lower lung fields Abdomen:  soft nontender nondistended Skin trace Lower extremity edema  Neuro grossly intact   Data Reviewed: Basic Metabolic Panel:  Lab 07/17/11 8119 07/15/11 0530 07/14/11 0424 07/13/11 1300  NA 140 138 135 138  K 3.0* 3.8 -- --  CL 104  104 102 99  CO2 28 26 25 28   GLUCOSE 154* 167* 190* 219*  BUN 14 15 14 8   CREATININE 0.82 0.74 0.74 0.63  CALCIUM 8.7 8.6 8.4 8.9  MG -- -- 1.4* --  PHOS -- -- -- --   Liver Function Tests:  Lab 07/13/11 1300  AST 15  ALT 7  ALKPHOS 81  BILITOT 0.9  PROT 7.2  ALBUMIN 3.7   No results found for this basename: LIPASE:5,AMYLASE:5 in the last 168 hours No results found for this basename: AMMONIA:5 in the last 168 hours CBC:  Lab 07/17/11 0456 07/15/11 0530 07/14/11 0424 07/13/11 1300  WBC 5.0 7.2 7.9 7.8  NEUTROABS -- -- -- 6.8  HGB 9.7* 10.1* 10.4* 12.1  HCT 30.1* 31.0* 31.5* 35.9*  MCV 96.2 96.9 96.3 96.0  PLT 122* 116* 124* 142*   Cardiac Enzymes: No results found for this basename: CKTOTAL:5,CKMB:5,CKMBINDEX:5,TROPONINI:5 in the last 168 hours BNP: No components found with this basename: POCBNP:5 CBG:  Lab 07/17/11 1204 07/17/11 0806 07/16/11 2148 07/16/11 1637 07/16/11 1145  GLUCAP 245* 144* 177* 148* 223*    Recent Results (from the past 240 hour(s))  CULTURE, BLOOD (ROUTINE X 2)     Status: Normal   Collection Time   07/13/11  1:00 PM      Component Value Range Status Comment   Specimen Description BLOOD RIGHT ANTECUBITAL   Final    Special Requests  BOTTLES DRAWN AEROBIC AND ANAEROBIC   Final    Culture  Setup Time [id number]   Final    Culture     Final    Value: STAPHYLOCOCCUS SPECIES (COAGULASE NEGATIVE)     Note: THE SIGNIFICANCE OF ISOLATING THIS ORGANISM FROM A SINGLE SET OF BLOOD CULTURES WHEN MULTIPLE SETS ARE DRAWN IS UNCERTAIN. PLEASE NOTIFY THE MICROBIOLOGY DEPARTMENT WITHIN ONE WEEK IF SPECIATION AND SENSITIVITIES ARE REQUIRED.     Note: Gram Stain Report Called to,Read Back By and Verified With: SHARLA ROBBS @ 2130 ON 07/14/11 BY GOLLD   Report Status 07/15/2011 FINAL   Final   CULTURE, BLOOD (ROUTINE X 2)     Status: Normal (Preliminary result)   Collection Time   07/13/11  1:05 PM      Component Value Range Status Comment   Specimen  Description BLOOD LEFT ANTECUBITAL   Final    Special Requests BOTTLES DRAWN AEROBIC AND ANAEROBIC   Final    Culture  Setup Time [id number]   Final    Culture     Final    Value:        BLOOD CULTURE RECEIVED NO GROWTH TO DATE CULTURE WILL BE HELD FOR 5 DAYS BEFORE ISSUING A FINAL NEGATIVE REPORT   Report Status PENDING   Incomplete   URINE CULTURE     Status: Normal   Collection Time   07/13/11  1:29 PM      Component Value Range Status Comment   Specimen Description URINE, CATHETERIZED   Final    Special Requests NONE   Final    Culture  Setup Time [id number]   Final    Colony Count >=100,000 COLONIES/ML   Final    Culture ENTEROCOCCUS SPECIES   Final    Report Status 07/15/2011 FINAL   Final    Organism ID, Bacteria ENTEROCOCCUS SPECIES   Final   MRSA PCR SCREENING     Status: Normal   Collection Time   07/13/11  7:00 PM      Component Value Range Status Comment   MRSA by PCR NEGATIVE  NEGATIVE  Final      Studies: Dg Chest 2 View  06/23/2011  *RADIOLOGY REPORT*  Clinical Data: Chills and cough.  CHEST - 2 VIEW  Comparison: Chest radiograph performed 05/22/2011  Findings: The lungs are well-aerated.  Vascular congestion is noted; mild left basilar atelectasis or scarring is again seen, stable from the prior study.  Mild right mid lung airspace opacity could reflect mild pneumonia.  This no longer appears to reflect an overlying shadow.  There is no evidence of pleural effusion or pneumothorax.  The heart is enlarged; calcification is noted within the aortic arch.  No acute osseous abnormalities are seen.  Clips are noted within the right upper quadrant, reflecting prior cholecystectomy.  IMPRESSION:  1.  Mild right midlung airspace opacity could reflect mild pneumonia. 2.  Vascular congestion and cardiomegaly; mild left basilar atelectasis or scarring again seen.  Original Report Authenticated By: Tonia Ghent, M.D.   Dg Chest Portable 1 View  07/13/2011  *RADIOLOGY REPORT*   Clinical Data: Body aches, weakness, shortness of breath, low oxygen saturation, cough and fever  PORTABLE CHEST - 1 VIEW  Comparison: March 22, 2012  Findings: There is cardiomegaly and mild pulmonary edema, unchanged.   Patchy opacity in the left lower lung remains present and not significantly changed, likely atelectasis.  No effusions are identified.  IMPRESSION: Cardiomegaly and mild pulmonary edema.  Patchy left lower lung opacity is unchanged and is likely atelectasis.  Original Report Authenticated By: Brandon Melnick, M.D.    Scheduled Meds:    . cholecalciferol  1,000 Units Oral Daily  . clopidogrel  75 mg Oral Daily  . cyanocobalamin  500 mcg Oral Daily  . dextromethorphan-guaiFENesin  1 tablet Oral BID  . donepezil  5 mg Oral QHS  . dorzolamide  1 drop Both Eyes TID  . doxycycline  100 mg Oral Q12H  . furosemide  20 mg Oral Daily  . gabapentin  100 mg Oral BID  . insulin aspart  0-15 Units Subcutaneous TID WC  . insulin aspart  0-5 Units Subcutaneous QHS  . lisinopril  10 mg Oral Daily  . metoprolol tartrate  25 mg Oral BID  . multivitamin  1 tablet Oral Daily  . olopatadine  1 drop Both Eyes Daily  . pantoprazole  40 mg Oral Q1200  . polyvinyl alcohol  1 drop Both Eyes QID  . potassium chloride SA  20 mEq Oral Daily  . potassium chloride  40 mEq Oral Once  . protein supplement  1 scoop Oral BID  . sertraline  25 mg Oral Daily  . simvastatin  10 mg Oral q1800  . sodium chloride  3 mL Intravenous Q12H  . Travoprost (BAK Free)  1 drop Both Eyes QHS  . traZODone  100 mg Oral QHS  . DISCONTD: levofloxacin (LEVAQUIN) IV  750 mg Intravenous Q24H  . DISCONTD: levofloxacin  500 mg Oral Daily  . DISCONTD: piperacillin-tazobactam (ZOSYN)  IV  3.375 g Intravenous Q8H  . DISCONTD: vancomycin  750 mg Intravenous Q12H   Continuous Infusions:     Assessment/Plan: 1.  E.coli >100,000 UTI-patient was treated with Zosyn and Vanco and Levaquin. DC IV antibiotic and started on  doxycycline by mouth based on sensitivities. 2. Pneumonia = patient was treated with triple antibiotic therapy. DC all IV antibiotic and change to by mouth doxycycline. 3. Diastolic dysfunction-continue Lasix 20 by mouth, lisinopril 10, metoprolol 25 twice a day.   4. Atrial fibrillation-continue beta blocker as above. Continue telemetry as bradycardic slightly at night.  Not on Coumadin due to fall risk 5. Diabetes mellitus type 2 (A1c 7.7)-acceptable control on sliding scale 6. Pancreatic lesion?-Discussed with the patient and her daughter. This is not a new finding. Per patient she discussed it with her primary care physician and because of her age no further workup is being done.  7. Dementia-continue Aricept, trazodone for sleep 8. Anemia-one point dropped and blood count and slightly low platelets, continue cyanocobalamin reassess with blood count in a.m. 9. Thrombocytopenia: Monitor 10. Shoulder pain: Patient does have chronic shoulder pain. Did x-ray no acute finding 11. Question of dysphagia: Patient complains of problems swallowing. Will get speech swallow  12. Code Status: Full. Family Communication: Discussed with daughter plan of care-all questions answered. Disposition Plan: Discharge to skilled nursing facility when appropriate. Discussed with patient's daughter.  Maria Posey, MD  [facility] Pager [phone] 07/17/2011, 12:24 PM    LOS: 4 days

## 2011-07-17 NOTE — Evaluation (Signed)
Occupational Therapy Evaluation Patient Details Name: Maria Christian MRN: [id number] DOB: September 03, 1924 Today's Date: 07/17/2011  Problem List:  Patient Active Problem List  Diagnoses  . Atrial fibrillation with controlled ventricular response  . Diastolic CHF, chronic  . Palpitations  . Pyuria  . Preventative health care  . Parkinson disease  . Hypertension  . GERD (gastroesophageal reflux disease)  . Hyperlipemia  . Diabetes mellitus  . Coronary artery disease  . History of adenomatous polyp of colon  . UTI (lower urinary tract infection)  . Pneumonia  . Thrombocytopenia  . Hypokalemia  . Shoulder pain  . Pancreatic mass  . Dysphagia    Past Medical History:  Past Medical History  Diagnosis Date  . Atrial fibrillation   . Altered mental status   . Encephalopathy   . Parkinson disease   . Hypertension   . GERD (gastroesophageal reflux disease)   . Hyperlipemia   . Diabetes mellitus   . Coronary artery disease   . History of adenomatous polyp of colon 07/02/2011  . Enlarged heart   . DEMENTIA   . Edema of lower extremity 07/13/11    right leg more swollen than left leg   Past Surgical History:  Past Surgical History  Procedure Date  . Left shoulder surgury 2001    left clavicle excision and acromioplasty  . Abdominal hysterectomy   . Cholecystectomy   . Joint replacement     right hip  . Breast surgery     2 benign tumors removed left breast  . Foot surgery     benign tumors from foot  . Esophageal dilation     several times by Dr. Victorino Dike    OT Assessment/Plan/Recommendation OT Assessment Clinical Impression Statement: This 78 year old female was admitted from SNF with pna.  Pt would benefit from skilled OT with mod A for toilet transfers  OT Recommendation/Assessment: Patient will need skilled OT in the acute care venue OT Problem List: Decreased strength;Decreased activity tolerance;Decreased cognition;Cardiopulmonary status limiting activity OT  Therapy Diagnosis : Generalized weakness OT Plan OT Frequency: Min 1X/week OT Treatment/Interventions: Self-care/ADL training;Balance training;Patient/family education;Therapeutic activities OT Recommendation Follow Up Recommendations: Skilled nursing facility Equipment Recommended: Defer to next venue Individuals Consulted Consulted and Agree with Results and Recommendations: Patient OT Goals Acute Rehab OT Goals OT Goal Formulation: With patient Time For Goal Achievement: 2 weeks ADL Goals Pt Will Transfer to Toilet: with mod assist;3-in-1;Stand pivot transfer ADL Goal: Toilet Transfer - Progress: Goal set today Miscellaneous OT Goals Miscellaneous OT Goal #1: Pt will sit EOB for 3 grooming tasks to increase endurance for adls OT Goal: Miscellaneous Goal #1 - Progress: Goal set today  OT Evaluation Precautions/Restrictions  Precautions Precautions: Fall Restrictions Weight Bearing Restrictions: No Prior Functioning Home Living Type of Home: Skilled Nursing Facility Prior Function Level of Independence: Needs assistance with ADLs ADL ADL Eating/Feeding: Simulated;Set up;Other (comment) (pt having test today:  cannot eat) Where Assessed - Eating/Feeding: Chair Grooming: Performed;Brushing hair;Set up Where Assessed - Grooming: Sitting, bed;Unsupported Upper Body Bathing: Simulated;Minimal assistance Where Assessed - Upper Body Bathing: Sitting, chair;Supported Lower Body Bathing: Simulated;+2 Total assistance;Comment for patient % (25% bathing; 40% sit to stand for ADLS) Where Assessed - Lower Body Bathing: Sit to stand from chair Upper Body Dressing: Simulated;Minimal assistance Where Assessed - Upper Body Dressing: Sit to stand from chair Lower Body Dressing: Simulated;+2 Total assistance;Comment for patient % (0) Where Assessed - Lower Body Dressing: Sit to stand from chair Toilet Transfer: Simulated;+2  Total assistance;Comment for patient % (50% to recliner) Toilet  Transfer Method: Stand pivot Toileting - Clothing Manipulation: Simulated;+2 Total assistance;Comment for patient % (10) Where Assessed - Toileting Clothing Manipulation: Sit to stand from 3-in-1 or toilet Toileting - Hygiene: Simulated;+2 Total assistance;Comment for patient % (10) Toileting - Hygiene Details (indicate cue type and reason): pt has incontinence Where Assessed - Toileting Hygiene: Sit to stand from 3-in-1 or toilet Equipment Used: Rolling walker ADL Comments: pt has assist for adls but states she does some.  Gets up to w/c Vision/Perception  Vision - History Baseline Vision: No visual deficits Cognition Cognition Arousal/Alertness: Awake/alert Overall Cognitive Status: Impaired Memory: Appears impaired Cognition - Other Comments: Pt is HOH Sensation/Coordination   Extremity Assessment RUE Assessment RUE Assessment: Within Functional Limits LUE Assessment LUE Assessment: Within Functional Limits Mobility  Bed Mobility Supine to Sit: 2: Max assist Transfers Sit to Stand: 1: +2 Total assist;Patient percentage (comment);With upper extremity assist;From bed;From elevated surface (pt 40%) Exercises   End of Session OT - End of Session Equipment Utilized During Treatment: Gait belt Activity Tolerance: Patient tolerated treatment well Patient left: in chair;with call bell in reach;Other (comment) (NT aware) General Behavior During Session: [facility] for tasks performed Cognition: Impaired, at baseline [facility], OTR/L [phone] 07/17/2011  Caoilainn Sacks 07/17/2011, 1:58 PM

## 2011-07-17 NOTE — Progress Notes (Signed)
CSW spoke with patients daughter. Patient is from [facility] assisted living. Daughter believes patient needs SNF for deconditioning. Discussed choices. She is requesting [facility] as first choice. FL-2 completed and faxed out. Pending Bed offers.  [facility] asked for therapy notes. CSW faxed same.  Olimpia Tinch C. Heela Heishman MSW, LCSW [phone]

## 2011-07-17 NOTE — Evaluation (Signed)
Clinical/Bedside Swallow Evaluation Patient Details  Name: Maria Christian MRN: [id number] DOB: 1925/04/12 Today's Date: 07/17/2011  Past Medical History:  Past Medical History  Diagnosis Date  . Atrial fibrillation   . Altered mental status   . Encephalopathy   . Parkinson disease   . Hypertension   . GERD (gastroesophageal reflux disease)   . Hyperlipemia   . Diabetes mellitus   . Coronary artery disease   . History of adenomatous polyp of colon 07/02/2011  . Enlarged heart   . DEMENTIA   . Edema of lower extremity 07/13/11    right leg more swollen than left leg   Past Surgical History:  Past Surgical History  Procedure Date  . Left shoulder surgury 2001    left clavicle excision and acromioplasty  . Abdominal hysterectomy   . Cholecystectomy   . Joint replacement     right hip  . Breast surgery     2 benign tumors removed left breast  . Foot surgery     benign tumors from foot  . Esophageal dilation     several times by Dr. Victorino Dike   HPI:  78 yo female adm to [facility] with n/v and dyspnea.  PMH + for Parkinson's disease (pt denies), GERD, Afib, CHF, CAD, DM, HTN, dementia, adematous polyp of colon, UTI, pna.  Pt complained of fever blister on lips to this SLP and choking on tuna fish yesterday.  Pt reported dysphagia to MD and swallow evaluation was ordered.     Assessment/Recommendations/Treatment Plan    SLP Assessment Clinical Impression Statement: Suspect pt's primary complaints of dysphagia are esophageal in nature.  Pt observed with minimal amount of liquids and yogurt..she was finishing lunch she had ordered.. without clinical s/s of aspiration. CN exam unremarkable and pt reports worsening choking episodes with solids versus liquids.   Pt is ordering soft or pureed items, stating she knows what she can swallow.  Dysphagia reported "for years" with acute worsening per pt.  Pt has undergone multiple EGD's per medical history, but pt did not recall this information  (suspect d/t dementia).  Pt reports hoarseness new and complained of throat burning after meal, SLP questions if nausea/vomiting episode as source of pain.  Also note pt had received Zofran in hospital last week, but has not taken it at [facility] place as her insurance company declined it per IT sales professional (at [facility]).  Anette Riedel also reported pt on low concentrated sweets diet at SNF with good tolerance.   A single episode (a few weeks ago) noted of pt complaining of dysphagia, but Anette Riedel reported MD unable to find source of complaint and no further complaints noted.  SLP to follow up one time to assure tolerance and hopefully to talk with daughter.  Thanks for this consult.   Risk for Aspiration: Mild  Swallow Evaluation Recommendations Solid Consistency: Regular (pt ordering soft items..eg soups, yogurt, etc) Liquid Consistency: Thin Liquid Administration via: Cup Medication Administration: Whole meds with liquid (tolerating po medicine per RN) Supervision: Patient able to self feed Compensations: Slow rate;Small sips/bites Postural Changes and/or Swallow Maneuvers: Seated upright 90 degrees Oral Care Recommendations: Oral care QID  Treatment Plan Treatment Plan Recommendations: Therapy as outlined in treatment plan below Speech Therapy Frequency: min 1 x/week Treatment Duration: 1 week Interventions: Aspiration precaution training;Compensatory techniques;Patient/family education     Individuals Consulted Consulted and Agree with Results and Recommendations: Patient  Swallowing Goals  SLP Swallowing Goals Patient will utilize recommended strategies during swallow to increase  swallowing safety with: Maximum assistance  Swallow Study Prior Functional Status  Type of Home: Skilled Nursing Facility  General  Date of Onset: 07/17/11 HPI: 78 yo female adm to [facility] with n/v and dyspnea.  PMH + for Parkinson's disease (pt denies), GERD, Afib, CHF, CAD, DM, HTN, dementia, adematous polyp of colon, UTI,  pna.  Pt complained of fever blister on lips to this SLP and choking on tuna fish yesterday.  Pt reported dysphagia to MD and swallow evaluation was ordered.   Type of Study: Bedside swallow evaluation Diet Prior to this Study: Regular;Thin liquids Respiratory Status: Supplemental O2 delivered via (comment) Behavior/Cognition: Alert;Cooperative Oral Cavity - Dentition: Dentures, not available (pt has no seabond and c/o pain) Vision: Functional for self-feeding Patient Positioning: Upright in bed Baseline Vocal Quality: Hoarse Volitional Cough: Strong Volitional Swallow: Able to elicit  Oral Motor/Sensory Function  Overall Oral Motor/Sensory Function: Appears within functional limits for tasks assessed  Consistency Results  Ice Chips Ice chips: Not tested  Thin Liquid Thin Liquid: Within functional limits Presentation: Cup;Self Fed  Nectar Thick Liquid Nectar Thick Liquid: Not tested     Puree Puree: Within functional limits Presentation: Self Fed;Spoon  Solid Other Comments: pt declined to take solids..reported sensation of choking on tuna yesterday (pointing to proximal esophagus)   pt with h/o EGDs per MD notes *but she denies   Donavan Burnet, [place] [facility] SLP [phone]

## 2011-07-18 DIAGNOSIS — E876 Hypokalemia: Secondary | ICD-10-CM | POA: Diagnosis not present

## 2011-07-18 DIAGNOSIS — D696 Thrombocytopenia, unspecified: Secondary | ICD-10-CM | POA: Diagnosis not present

## 2011-07-18 DIAGNOSIS — E782 Mixed hyperlipidemia: Secondary | ICD-10-CM | POA: Diagnosis not present

## 2011-07-18 DIAGNOSIS — I4891 Unspecified atrial fibrillation: Secondary | ICD-10-CM | POA: Diagnosis not present

## 2011-07-18 LAB — CBC
Hemoglobin: 10.1 g/dL — ABNORMAL LOW (ref 12.0–15.0)
MCH: 32.3 pg (ref 26.0–34.0)
MCHC: 33.8 g/dL (ref 30.0–36.0)
MCV: 95.5 fL (ref 78.0–100.0)
RBC: 3.13 MIL/uL — ABNORMAL LOW (ref 3.87–5.11)

## 2011-07-18 LAB — COMPREHENSIVE METABOLIC PANEL
CO2: 25 mEq/L (ref 19–32)
Calcium: 8.9 mg/dL (ref 8.4–10.5)
Creatinine, Ser: 0.73 mg/dL (ref 0.50–1.10)
GFR calc Af Amer: 88 mL/min — ABNORMAL LOW (ref >90)
GFR calc non Af Amer: 76 mL/min — ABNORMAL LOW (ref >90)
Glucose, Bld: 151 mg/dL — ABNORMAL HIGH (ref 70–99)
Sodium: 138 mEq/L (ref 135–145)
Total Protein: 5.8 g/dL — ABNORMAL LOW (ref 6.0–8.3)

## 2011-07-18 LAB — GLUCOSE, CAPILLARY
Glucose-Capillary: 145 mg/dL — ABNORMAL HIGH (ref 70–99)
Glucose-Capillary: 200 mg/dL — ABNORMAL HIGH (ref 70–99)
Glucose-Capillary: 141 mg/dL — ABNORMAL HIGH (ref 70–99)

## 2011-07-18 LAB — LIPASE, BLOOD: Lipase: 16 U/L (ref 11–59)

## 2011-07-18 MED ORDER — POTASSIUM CHLORIDE CRYS ER 20 MEQ PO TBCR
40.0000 meq | EXTENDED_RELEASE_TABLET | Freq: Once | ORAL | Status: AC
  Administered 2011-07-18: 40 meq via ORAL
  Filled 2011-07-18: qty 2

## 2011-07-18 NOTE — Progress Notes (Signed)
PT/OT/ST Cancellation Note  ___Treatment cancelled today due to medical issues with patient which prohibited therapy  ___ Treatment cancelled today due to patient receiving procedure or test   ___ Treatment cancelled today due to patient's refusal to participate   __x_ Treatment cancelled this a.m. at pt's request. Pt c/o not feeling well. Requested PT check back this p.m. Will check back as schedule permits. Thanks.   Signature: [name], PT [phone]

## 2011-07-18 NOTE — Progress Notes (Signed)
Speech Language/Pathology Speech Language Pathology Swallow Treatment Patient Details Name: FLORICE HINDLE MRN: [id number] DOB: [birth year] Today's Date: 07/18/2011 1350-1405  Lung Sounds:  clear Temperature: afebrile, pt reports having a fever last pm, but is feeling better today.  Patient required: no cues to consistently follow precautions/strategies- pt self-modulates po intake due to chronic dysphagia, she did verbalize that her swallow is not improved compared to admission - pt with meal tray at bedside - she consumed egg salad, yogurt without problems per her statement.    Clinical Impression: Pt's swallow is likely further impaired currently due to pt not wearing dentures (pt does not have "Seabond" here and c/o "fever blister" and "sore spots" in her mouth).  Pt reports her daughter is sick and unable to come to hospital to bring her the [place].  Pt's daughter is ordering her meals (per pt) and she is consuming soft foods.  Medications are being taken with applesauce and pt reports tolerance of this method.  As pt has a history of esophageal dilatations (multiple by Dr Victorino Dike per history)and points to proximal esophagus to indicate area of food lodging, primary dysphagia symptoms are consistent with esophageal deficits with possible acute exacerbation with current illness.  Pt further states she feels like she "fills up and has to wait for it to go down" pointing to her esophagus.  SLP provided tips to compensate for pt's baseline dysphagia.  Also advised pt to monitor for improvement upon d/c hone and pursue eval with GI if it does not resolve.       Recommendations:  Continue po diet with precautions, monitoring tolerance.   Pain:   none Intervention Required:   No  Goals: All Goals Met  Donavan Burnet, MS [facility] SLP [phone]

## 2011-07-18 NOTE — Progress Notes (Signed)
PROGRESS NOTE  LOREN VICENS BJY:[id number] DOB: [birth year] DOA: 07/13/2011 PCP: Florentina Jenny, MD, MD  Brief narrative: 78 year old Caucasian female admitted with likely UTI, but covered with HCAP coverage as had cough/sputum  Past medical history: Atrial fibrillation with Patient [place] score 5, CHF-EF 55-60% (grade 2 diastolic dysfunction) diabetes mellitus (A1c 7.7), possible pancreatic lesion (needs Rpt Ct 3-4 mo)   Consultants:  None  Procedures:  07/13/11 = patchy left lower lung opacity, likely atelectasis  Urine Culture >100,000 CFU E.coli, sensit pending  Blood culture 1/2 Gram + cocci=Coag neg staphylococcus  Antibiotics:  Zosyn 2/7 >>2/11  Vancomycin 2/7 >>2/11  Levofloxacin 2/7 >>2/11  Doxycycline 2/11>  7Subjective   Patient states that she just feels better today.   Objective    Interim History: Chart Reviewed H&P reviewed  Objective: Filed Vitals:   07/17/11 2148 07/17/11 2214 07/18/11 0515 07/18/11 1416  BP: 155/82 162/76 133/72 144/70  Pulse: 87 81 62 52  Temp:  99.3 F (37.4 C) 98.3 F (36.8 C) 98.1 F (36.7 C)  TempSrc:  Oral Oral Oral  Resp:  16 18 18   Height:      Weight:      SpO2:  94% 97% 100%    Intake/Output Summary (Last 24 hours) at 07/18/11 1530 Last data filed at 07/18/11 1300  Gross per 24 hour  Intake    243 ml  Output      0 ml  Net    243 ml    Exam:  General:  Caucasian female in no cardiopulmonary distress Cardiovascular: regular rhythm Respiratory: Clear  bilat lower lung fields Abdomen:  soft nontender nondistended Skin trace Lower extremity edema  Neuro grossly intact   Data Reviewed: Basic Metabolic Panel:  Lab 07/18/11 0865 07/17/11 0456 07/15/11 0530 07/14/11 0424 07/13/11 1300  NA 138 140 138 135 138  K 3.2* 3.0* -- -- --  CL 104 104 104 102 99  CO2 25 28 26 25 28   GLUCOSE 151* 154* 167* 190* 219*  BUN 15 14 15 14 8   CREATININE 0.73 0.82 0.74 0.74 0.63  CALCIUM 8.9 8.7 8.6 8.4 8.9  MG -- -- --  1.4* --  PHOS -- -- -- -- --   Liver Function Tests:  Lab 07/18/11 0356 07/13/11 1300  AST 13 15  ALT 10 7  ALKPHOS 66 81  BILITOT 0.3 0.9  PROT 5.8* 7.2  ALBUMIN 2.6* 3.7    Lab 07/18/11 0356  LIPASE 16  AMYLASE --   No results found for this basename: AMMONIA:5 in the last 168 hours CBC:  Lab 07/18/11 0356 07/17/11 0456 07/15/11 0530 07/14/11 0424 07/13/11 1300  WBC 5.6 5.0 7.2 7.9 7.8  NEUTROABS -- -- -- -- 6.8  HGB 10.1* 9.7* 10.1* 10.4* 12.1  HCT 29.9* 30.1* 31.0* 31.5* 35.9*  MCV 95.5 96.2 96.9 96.3 96.0  PLT 122* 122* 116* 124* 142*   Cardiac Enzymes: No results found for this basename: CKTOTAL:5,CKMB:5,CKMBINDEX:5,TROPONINI:5 in the last 168 hours BNP: No components found with this basename: POCBNP:5 CBG:  Lab 07/18/11 1206 07/18/11 0801 07/17/11 2212 07/17/11 1637 07/17/11 1204  GLUCAP 200* 145* 142* 171* 245*    Recent Results (from the past 240 hour(s))  CULTURE, BLOOD (ROUTINE X 2)     Status: Normal   Collection Time   07/13/11  1:00 PM      Component Value Range Status Comment   Specimen Description BLOOD RIGHT ANTECUBITAL   Final    Special Requests BOTTLES DRAWN AEROBIC  AND ANAEROBIC   Final    Culture  Setup Time [id number]   Final    Culture     Final    Value: STAPHYLOCOCCUS SPECIES (COAGULASE NEGATIVE)     Note: THE SIGNIFICANCE OF ISOLATING THIS ORGANISM FROM A SINGLE SET OF BLOOD CULTURES WHEN MULTIPLE SETS ARE DRAWN IS UNCERTAIN. PLEASE NOTIFY THE MICROBIOLOGY DEPARTMENT WITHIN ONE WEEK IF SPECIATION AND SENSITIVITIES ARE REQUIRED.     Note: Gram Stain Report Called to,Read Back By and Verified With: SHARLA ROBBS @ 2130 ON 07/14/11 BY GOLLD   Report Status 07/15/2011 FINAL   Final   CULTURE, BLOOD (ROUTINE X 2)     Status: Normal (Preliminary result)   Collection Time   07/13/11  1:05 PM      Component Value Range Status Comment   Specimen Description BLOOD LEFT ANTECUBITAL   Final    Special Requests BOTTLES DRAWN AEROBIC AND ANAEROBIC    Final    Culture  Setup Time [id number]   Final    Culture     Final    Value:        BLOOD CULTURE RECEIVED NO GROWTH TO DATE CULTURE WILL BE HELD FOR 5 DAYS BEFORE ISSUING A FINAL NEGATIVE REPORT   Report Status PENDING   Incomplete   URINE CULTURE     Status: Normal   Collection Time   07/13/11  1:29 PM      Component Value Range Status Comment   Specimen Description URINE, CATHETERIZED   Final    Special Requests NONE   Final    Culture  Setup Time [id number]   Final    Colony Count >=100,000 COLONIES/ML   Final    Culture ENTEROCOCCUS SPECIES   Final    Report Status 07/15/2011 FINAL   Final    Organism ID, Bacteria ENTEROCOCCUS SPECIES   Final   MRSA PCR SCREENING     Status: Normal   Collection Time   07/13/11  7:00 PM      Component Value Range Status Comment   MRSA by PCR NEGATIVE  NEGATIVE  Final      Studies: Dg Chest 2 View  06/23/2011  *RADIOLOGY REPORT*  Clinical Data: Chills and cough.  CHEST - 2 VIEW  Comparison: Chest radiograph performed 05/22/2011  Findings: The lungs are well-aerated.  Vascular congestion is noted; mild left basilar atelectasis or scarring is again seen, stable from the prior study.  Mild right mid lung airspace opacity could reflect mild pneumonia.  This no longer appears to reflect an overlying shadow.  There is no evidence of pleural effusion or pneumothorax.  The heart is enlarged; calcification is noted within the aortic arch.  No acute osseous abnormalities are seen.  Clips are noted within the right upper quadrant, reflecting prior cholecystectomy.  IMPRESSION:  1.  Mild right midlung airspace opacity could reflect mild pneumonia. 2.  Vascular congestion and cardiomegaly; mild left basilar atelectasis or scarring again seen.  Original Report Authenticated By: Tonia Ghent, M.D.   Dg Chest Portable 1 View  07/13/2011  *RADIOLOGY REPORT*  Clinical Data: Body aches, weakness, shortness of breath, low oxygen saturation, cough and fever   PORTABLE CHEST - 1 VIEW  Comparison: March 22, 2012  Findings: There is cardiomegaly and mild pulmonary edema, unchanged.   Patchy opacity in the left lower lung remains present and not significantly changed, likely atelectasis.  No effusions are identified.  IMPRESSION: Cardiomegaly and mild pulmonary edema.  Patchy left  lower lung opacity is unchanged and is likely atelectasis.  Original Report Authenticated By: Brandon Melnick, M.D.    Scheduled Meds:    . cholecalciferol  1,000 Units Oral Daily  . clopidogrel  75 mg Oral Daily  . cyanocobalamin  500 mcg Oral Daily  . dextromethorphan-guaiFENesin  1 tablet Oral BID  . donepezil  5 mg Oral QHS  . dorzolamide  1 drop Both Eyes TID  . doxycycline  100 mg Oral Q12H  . furosemide  20 mg Oral Daily  . gabapentin  100 mg Oral BID  . insulin aspart  0-15 Units Subcutaneous TID WC  . insulin aspart  0-5 Units Subcutaneous QHS  . lisinopril  10 mg Oral Daily  . metoprolol tartrate  25 mg Oral BID  . multivitamin  1 tablet Oral Daily  . olopatadine  1 drop Both Eyes Daily  . pantoprazole  40 mg Oral Q1200  . polyvinyl alcohol  1 drop Both Eyes QID  . potassium chloride SA  20 mEq Oral Daily  . protein supplement  1 scoop Oral BID  . sertraline  25 mg Oral Daily  . simvastatin  10 mg Oral q1800  . sodium chloride  3 mL Intravenous Q12H  . Travoprost (BAK Free)  1 drop Both Eyes QHS  . traZODone  100 mg Oral QHS   Continuous Infusions:     Assessment/Plan: 1.  E.coli >100,000 UTI-patient was treated with Zosyn and Vanco and Levaquin. DC IV antibiotic and started on doxycycline by mouth based on sensitivities. 2. Pneumonia = patient was treated with triple antibiotic therapy. DC all IV antibiotic and change to by mouth doxycycline. 3. Diastolic dysfunction-continue Lasix 20 by mouth, lisinopril 10, metoprolol 25 twice a day.   4. Atrial fibrillation-continue beta blocker as above. Continue telemetry as bradycardic slightly at night.  Not  on Coumadin due to fall risk 5. Diabetes mellitus type 2 (A1c 7.7)-acceptable control on sliding scale 6. Pancreatic lesion?-Discussed with the patient and her daughter. This is not a new finding. Per patient she discussed it with her primary care physician and because of her age no further workup is being done.  7. Dementia-continue Aricept, trazodone for sleep 8. Anemia-one point dropped and blood count and slightly low platelets, continue cyanocobalamin reassess with blood count in a.m. 9. Thrombocytopenia: Monitor 10. Shoulder pain: Patient does have chronic shoulder pain. Did x-ray no acute finding 11. Question of Dysphagia: Patient complains of problems swallowing. Patient was evaluated by speech therapy. She can followup outpatient with her gastroenterologist for possible dilatation.  12. Hypokinemia: Replete 13. Code Status: Full. Family Communication: Discussed with daughter plan of care-all questions answered. Disposition Plan: Discharge to skilled nursing facility when bed available. Discussed with patient's daughter.  Molli Posey, MD  [facility] Pager [phone] 07/18/2011, 3:30 PM    LOS: 5 days

## 2011-07-18 NOTE — Progress Notes (Signed)
Physical Therapy Treatment Patient Details Name: Maria Christian MRN: [id number] DOB: 1924/06/08 Today's Date: 07/18/2011  PT Assessment/Plan  PT - Assessment/Plan Comments on Treatment Session: Pt agreeable to sit EOB, stand, perform LE exercises. Pt declined transfer to recliner-states she sat too long yesterday and she doesn't feel good.  PT Plan: Discharge plan remains appropriate Follow Up Recommendations: Skilled nursing facility Equipment Recommended: Defer to next venue PT Goals  Acute Rehab PT Goals PT Goal: Supine/Side to Sit - Progress: Progressing toward goal PT Goal: Sit to Supine/Side - Progress: Progressing toward goal PT Goal: Sit to Stand - Progress: Progressing toward goal PT Goal: Stand to Sit - Progress: Progressing toward goal PT Goal: Perform Home Exercise Program - Progress: Progressing toward goal  PT Treatment Precautions/Restrictions  Precautions Precautions: Fall Restrictions Weight Bearing Restrictions: No Mobility (including Balance) Bed Mobility Bed Mobility: Yes Supine to Sit: 3: Mod assist;HOB elevated (Comment degrees);With rails Supine to Sit Details (indicate cue type and reason): VCs safety, technique, hand placement. Assist for trunk to upright. Utilized bedpad to assist with scooting, positioning Sit to Supine: HOB flat;2: Max assist Sit to Supine - Details (indicate cue type and reason): VCs safety, technique. Assist for bil LEs onto bed and trunk to supine.  Transfers Transfers: Yes Sit to Stand: 1: +2 Total assist;From bed;With upper extremity assist (Pt=50%) Sit to Stand Details (indicate cue type and reason): x 2. VCs safety, technique, hand placement. Assist to rise, stabilize. Pt stood for 30-45 seconds on 1st trial.  Stand to Sit: To bed (Pt=50%) Stand to Sit Details: x 2. VCs safety, technique, hand placement. Assist to control descent.  Ambulation/Gait Ambulation/Gait: Yes Ambulation/Gait Assistance: 1: +2 Total assist  (Pt=50%) Ambulation/Gait Assistance Details (indicate cue type and reason): VCs safety, technique, posture. Assist to stabilize and negotiate with RW. 3-4 side steps to  [facility].  Assistive device: Rolling walker    Exercise  General Exercises - Lower Extremity Ankle Circles/Pumps: AROM;Both;10 reps;Seated Long Arc Quad: AROM;Both;10 reps;Seated End of Session PT - End of Session Equipment Utilized During Treatment: Gait belt Activity Tolerance: Patient limited by fatigue Patient left: in bed;with call bell in reach General Behavior During Session: [facility] for tasks performed Cognition: Impaired, at baseline  [name] [facility] 07/18/2011, 2:11 PM [phone]

## 2011-07-18 NOTE — Plan of Care (Signed)
Problem: Phase III Progression Outcomes Goal: Activity at appropriate level-compared to baseline (UP IN CHAIR FOR HEMODIALYSIS)  Outcome: Completed/Met Date Met:  07/18/11 07/17/11 Patient was sitting in chair and assisted back to bed tonight

## 2011-07-19 DIAGNOSIS — I4891 Unspecified atrial fibrillation: Secondary | ICD-10-CM | POA: Diagnosis not present

## 2011-07-19 DIAGNOSIS — D696 Thrombocytopenia, unspecified: Secondary | ICD-10-CM | POA: Diagnosis not present

## 2011-07-19 DIAGNOSIS — E782 Mixed hyperlipidemia: Secondary | ICD-10-CM | POA: Diagnosis not present

## 2011-07-19 DIAGNOSIS — E876 Hypokalemia: Secondary | ICD-10-CM | POA: Diagnosis not present

## 2011-07-19 LAB — GLUCOSE, CAPILLARY
Glucose-Capillary: 142 mg/dL — ABNORMAL HIGH (ref 70–99)
Glucose-Capillary: 161 mg/dL — ABNORMAL HIGH (ref 70–99)
Glucose-Capillary: 125 mg/dL — ABNORMAL HIGH (ref 70–99)

## 2011-07-19 LAB — BASIC METABOLIC PANEL
BUN: 14 mg/dL (ref 6–23)
Calcium: 9.3 mg/dL (ref 8.4–10.5)
GFR calc Af Amer: 86 mL/min — ABNORMAL LOW (ref >90)
GFR calc non Af Amer: 74 mL/min — ABNORMAL LOW (ref >90)
Glucose, Bld: 152 mg/dL — ABNORMAL HIGH (ref 70–99)
Potassium: 3.5 mEq/L (ref 3.5–5.1)
Sodium: 141 mEq/L (ref 135–145)

## 2011-07-19 LAB — CBC
Hemoglobin: 10.9 g/dL — ABNORMAL LOW (ref 12.0–15.0)
MCH: 31.4 pg (ref 26.0–34.0)
MCHC: 32.7 g/dL (ref 30.0–36.0)
Platelets: 143 10*3/uL — ABNORMAL LOW (ref 150–400)
RBC: 3.47 MIL/uL — ABNORMAL LOW (ref 3.87–5.11)

## 2011-07-19 MED ORDER — GI COCKTAIL ~~LOC~~
30.0000 mL | Freq: Once | ORAL | Status: AC
  Administered 2011-07-19: 30 mL via ORAL
  Filled 2011-07-19: qty 30

## 2011-07-19 NOTE — Progress Notes (Signed)
PROGRESS NOTE  Maria Christian WUJ:[id number] DOB: 11/02/1924 DOA: 07/13/2011 PCP: Florentina Jenny, MD, MD  Brief narrative: 77 year old Caucasian female admitted with likely UTI, but covered with HCAP coverage as had cough/sputum  Past medical history: Atrial fibrillation with Patient [place] score 5, CHF-EF 55-60% (grade 2 diastolic dysfunction) diabetes mellitus (A1c 7.7), possible pancreatic lesion (needs Rpt Ct 3-4 mo)   Consultants:  None  Procedures:  07/13/11 = patchy left lower lung opacity, likely atelectasis  Urine Culture >100,000 CFU E.coli, sensit pending  Blood culture 1/2 Gram + cocci=Coag neg staphylococcus  Antibiotics:  Zosyn 2/7 >>2/11  Vancomycin 2/7 >>2/11  Levofloxacin 2/7 >>2/11  Doxycycline 2/11>  7Subjective   Patient states that she is not feeling well today.  She complains of epigastric pain and feels as if her heart is fluttering..   Objective    Interim History: Chart Reviewed H&P reviewed  Objective: Filed Vitals:   07/18/11 1416 07/18/11 2211 07/19/11 0534 07/19/11 1000  BP: 144/70 154/71 165/73 151/75  Pulse: 52 51 73 53  Temp: 98.1 F (36.7 C) 97.7 F (36.5 C) 97.5 F (36.4 C) 98.3 F (36.8 C)  TempSrc: Oral Oral Oral Oral  Resp: 18 18 18 18   Height:      Weight:      SpO2: 100% 98% 98% 97%    Intake/Output Summary (Last 24 hours) at 07/19/11 1121 Last data filed at 07/19/11 0900  Gross per 24 hour  Intake    360 ml  Output      0 ml  Net    360 ml    Exam:  General:  Caucasian female in no cardiopulmonary distress Cardiovascular:irregular rhythm Respiratory: Clear  bilat lower lung fields Abdomen:  soft nontender nondistended Skin trace Lower extremity edema  Neuro grossly intact   Data Reviewed: Basic Metabolic Panel:  Lab 07/19/11 9562 07/18/11 0356 07/17/11 0456 07/15/11 0530 07/14/11 0424  NA 141 138 140 138 135  K 3.5 3.2* -- -- --  CL 105 104 104 104 102  CO2 28 25 28 26 25   GLUCOSE 152* 151* 154* 167*  190*  BUN 14 15 14 15 14   CREATININE 0.79 0.73 0.82 0.74 0.74  CALCIUM 9.3 8.9 8.7 8.6 8.4  MG -- -- -- -- 1.4*  PHOS -- -- -- -- --   Liver Function Tests:  Lab 07/18/11 0356 07/13/11 1300  AST 13 15  ALT 10 7  ALKPHOS 66 81  BILITOT 0.3 0.9  PROT 5.8* 7.2  ALBUMIN 2.6* 3.7    Lab 07/18/11 0356  LIPASE 16  AMYLASE --   No results found for this basename: AMMONIA:5 in the last 168 hours CBC:  Lab 07/19/11 0438 07/18/11 0356 07/17/11 0456 07/15/11 0530 07/14/11 0424 07/13/11 1300  WBC 4.6 5.6 5.0 7.2 7.9 --  NEUTROABS -- -- -- -- -- 6.8  HGB 10.9* 10.1* 9.7* 10.1* 10.4* --  HCT 33.3* 29.9* 30.1* 31.0* 31.5* --  MCV 96.0 95.5 96.2 96.9 96.3 --  PLT 143* 122* 122* 116* 124* --   Cardiac Enzymes: No results found for this basename: CKTOTAL:5,CKMB:5,CKMBINDEX:5,TROPONINI:5 in the last 168 hours BNP: No components found with this basename: POCBNP:5 CBG:  Lab 07/18/11 2133 07/18/11 1722 07/18/11 1206 07/18/11 0801 07/17/11 2212  GLUCAP 141* 176* 200* 145* 142*    Recent Results (from the past 240 hour(s))  CULTURE, BLOOD (ROUTINE X 2)     Status: Normal   Collection Time   07/13/11  1:00 PM  Component Value Range Status Comment   Specimen Description BLOOD RIGHT ANTECUBITAL   Final    Special Requests BOTTLES DRAWN AEROBIC AND ANAEROBIC   Final    Culture  Setup Time [id number]   Final    Culture     Final    Value: STAPHYLOCOCCUS SPECIES (COAGULASE NEGATIVE)     Note: THE SIGNIFICANCE OF ISOLATING THIS ORGANISM FROM A SINGLE SET OF BLOOD CULTURES WHEN MULTIPLE SETS ARE DRAWN IS UNCERTAIN. PLEASE NOTIFY THE MICROBIOLOGY DEPARTMENT WITHIN ONE WEEK IF SPECIATION AND SENSITIVITIES ARE REQUIRED.     Note: Gram Stain Report Called to,Read Back By and Verified With: SHARLA ROBBS @ 2130 ON 07/14/11 BY GOLLD   Report Status 07/15/2011 FINAL   Final   CULTURE, BLOOD (ROUTINE X 2)     Status: Normal (Preliminary result)   Collection Time   07/13/11  1:05 PM       Component Value Range Status Comment   Specimen Description BLOOD LEFT ANTECUBITAL   Final    Special Requests BOTTLES DRAWN AEROBIC AND ANAEROBIC   Final    Culture  Setup Time [id number]   Final    Culture     Final    Value:        BLOOD CULTURE RECEIVED NO GROWTH TO DATE CULTURE WILL BE HELD FOR 5 DAYS BEFORE ISSUING A FINAL NEGATIVE REPORT   Report Status PENDING   Incomplete   URINE CULTURE     Status: Normal   Collection Time   07/13/11  1:29 PM      Component Value Range Status Comment   Specimen Description URINE, CATHETERIZED   Final    Special Requests NONE   Final    Culture  Setup Time [id number]   Final    Colony Count >=100,000 COLONIES/ML   Final    Culture ENTEROCOCCUS SPECIES   Final    Report Status 07/15/2011 FINAL   Final    Organism ID, Bacteria ENTEROCOCCUS SPECIES   Final   MRSA PCR SCREENING     Status: Normal   Collection Time   07/13/11  7:00 PM      Component Value Range Status Comment   MRSA by PCR NEGATIVE  NEGATIVE  Final      Studies: Dg Chest 2 View  06/23/2011  *RADIOLOGY REPORT*  Clinical Data: Chills and cough.  CHEST - 2 VIEW  Comparison: Chest radiograph performed 05/22/2011  Findings: The lungs are well-aerated.  Vascular congestion is noted; mild left basilar atelectasis or scarring is again seen, stable from the prior study.  Mild right mid lung airspace opacity could reflect mild pneumonia.  This no longer appears to reflect an overlying shadow.  There is no evidence of pleural effusion or pneumothorax.  The heart is enlarged; calcification is noted within the aortic arch.  No acute osseous abnormalities are seen.  Clips are noted within the right upper quadrant, reflecting prior cholecystectomy.  IMPRESSION:  1.  Mild right midlung airspace opacity could reflect mild pneumonia. 2.  Vascular congestion and cardiomegaly; mild left basilar atelectasis or scarring again seen.  Original Report Authenticated By: Tonia Ghent, M.D.   Dg Chest  Portable 1 View  07/13/2011  *RADIOLOGY REPORT*  Clinical Data: Body aches, weakness, shortness of breath, low oxygen saturation, cough and fever  PORTABLE CHEST - 1 VIEW  Comparison: March 22, 2012  Findings: There is cardiomegaly and mild pulmonary edema, unchanged.   Patchy opacity in the left lower lung  remains present and not significantly changed, likely atelectasis.  No effusions are identified.  IMPRESSION: Cardiomegaly and mild pulmonary edema.  Patchy left lower lung opacity is unchanged and is likely atelectasis.  Original Report Authenticated By: Brandon Melnick, M.D.    Scheduled Meds:    . cholecalciferol  1,000 Units Oral Daily  . clopidogrel  75 mg Oral Daily  . cyanocobalamin  500 mcg Oral Daily  . dextromethorphan-guaiFENesin  1 tablet Oral BID  . donepezil  5 mg Oral QHS  . dorzolamide  1 drop Both Eyes TID  . doxycycline  100 mg Oral Q12H  . furosemide  20 mg Oral Daily  . gabapentin  100 mg Oral BID  . insulin aspart  0-15 Units Subcutaneous TID WC  . insulin aspart  0-5 Units Subcutaneous QHS  . lisinopril  10 mg Oral Daily  . metoprolol tartrate  25 mg Oral BID  . multivitamin  1 tablet Oral Daily  . olopatadine  1 drop Both Eyes Daily  . pantoprazole  40 mg Oral Q1200  . polyvinyl alcohol  1 drop Both Eyes QID  . potassium chloride SA  20 mEq Oral Daily  . potassium chloride  40 mEq Oral Once  . protein supplement  1 scoop Oral BID  . sertraline  25 mg Oral Daily  . simvastatin  10 mg Oral q1800  . sodium chloride  3 mL Intravenous Q12H  . Travoprost (BAK Free)  1 drop Both Eyes QHS  . traZODone  100 mg Oral QHS   Continuous Infusions:     Assessment/Plan: 1.  E.coli >100,000 UTI-patient was treated with Zosyn and Vanco and Levaquin. DC IV antibiotic and started on doxycycline by mouth based on sensitivities. 2. Pneumonia = patient was treated with triple antibiotic therapy. DC all IV antibiotic and change to by mouth doxycycline. 3. Diastolic  dysfunction-continue Lasix 20 by mouth, lisinopril 10, metoprolol 25 twice a day.   4. Atrial fibrillation-continue beta blocker as above. Continue telemetry as bradycardic slightly at night.  Not on Coumadin due to fall risk.  Will monitor 5. Diabetes mellitus type 2 (A1c 7.7)-acceptable control on sliding scale 6. Pancreatic lesion?-Discussed with the patient and her daughter. This is not a new finding. Per patient she discussed it with her primary care physician and because of her age no further workup is being done.  7. Dementia-continue Aricept, trazodone for sleep 8. Anemia-one point dropped and blood count and slightly low platelets, continue cyanocobalamin reassess with blood count in a.m. 9. Thrombocytopenia: Monitor 10. Shoulder pain: Patient does have chronic shoulder pain. Did x-ray no acute finding 11. Question of Dysphagia: Patient complains of problems swallowing. Patient was evaluated by speech therapy. She can followup outpatient with her gastroenterologist for possible dilatation.  12. Hypokinemia: Replete 13. Epigastric Pain:  Patient is on Protonix will try a GI cocktail.   14. Code Status: Full. Family Communication: Discussed with daughter plan of care-all questions answered. Disposition Plan: Discharge to skilled nursing facility.  Discussed with her discharge today.  Patient states that she is not feeling well today she complains of feeling like her heart is fluttering and  Epigastric pain.  Will check 1 set of cardiac enzyme and give a GI cocktail. Hopefully can be discharged tomorrow if stable.   Molli Posey, MD  [facility] Pager [phone] 07/19/2011, 11:21 AM    LOS: 6 days

## 2011-07-19 NOTE — Progress Notes (Signed)
Patient has a bed at [facility] upon discharge. T/C to patient son, Maria Christian, Left voicemail requesting call back. Maria Christian MSW, LCSW [phone]

## 2011-07-20 ENCOUNTER — Other Ambulatory Visit: Payer: Self-pay

## 2011-07-20 DIAGNOSIS — I10 Essential (primary) hypertension: Secondary | ICD-10-CM | POA: Diagnosis not present

## 2011-07-20 DIAGNOSIS — E876 Hypokalemia: Secondary | ICD-10-CM | POA: Diagnosis not present

## 2011-07-20 DIAGNOSIS — M542 Cervicalgia: Secondary | ICD-10-CM | POA: Diagnosis not present

## 2011-07-20 DIAGNOSIS — J811 Chronic pulmonary edema: Secondary | ICD-10-CM | POA: Diagnosis not present

## 2011-07-20 DIAGNOSIS — I519 Heart disease, unspecified: Secondary | ICD-10-CM | POA: Diagnosis not present

## 2011-07-20 DIAGNOSIS — E782 Mixed hyperlipidemia: Secondary | ICD-10-CM | POA: Diagnosis not present

## 2011-07-20 DIAGNOSIS — H4011X0 Primary open-angle glaucoma, stage unspecified: Secondary | ICD-10-CM | POA: Diagnosis not present

## 2011-07-20 DIAGNOSIS — E119 Type 2 diabetes mellitus without complications: Secondary | ICD-10-CM | POA: Diagnosis not present

## 2011-07-20 DIAGNOSIS — B351 Tinea unguium: Secondary | ICD-10-CM | POA: Diagnosis not present

## 2011-07-20 DIAGNOSIS — B029 Zoster without complications: Secondary | ICD-10-CM | POA: Diagnosis not present

## 2011-07-20 DIAGNOSIS — F329 Major depressive disorder, single episode, unspecified: Secondary | ICD-10-CM | POA: Diagnosis not present

## 2011-07-20 DIAGNOSIS — D649 Anemia, unspecified: Secondary | ICD-10-CM | POA: Diagnosis not present

## 2011-07-20 DIAGNOSIS — N9489 Other specified conditions associated with female genital organs and menstrual cycle: Secondary | ICD-10-CM | POA: Diagnosis not present

## 2011-07-20 DIAGNOSIS — B3749 Other urogenital candidiasis: Secondary | ICD-10-CM | POA: Diagnosis not present

## 2011-07-20 DIAGNOSIS — M79609 Pain in unspecified limb: Secondary | ICD-10-CM | POA: Diagnosis not present

## 2011-07-20 DIAGNOSIS — J189 Pneumonia, unspecified organism: Secondary | ICD-10-CM | POA: Diagnosis not present

## 2011-07-20 DIAGNOSIS — I4891 Unspecified atrial fibrillation: Secondary | ICD-10-CM | POA: Diagnosis not present

## 2011-07-20 DIAGNOSIS — E785 Hyperlipidemia, unspecified: Secondary | ICD-10-CM | POA: Diagnosis not present

## 2011-07-20 DIAGNOSIS — R609 Edema, unspecified: Secondary | ICD-10-CM | POA: Diagnosis not present

## 2011-07-20 DIAGNOSIS — D696 Thrombocytopenia, unspecified: Secondary | ICD-10-CM | POA: Diagnosis not present

## 2011-07-20 DIAGNOSIS — Q516 Embryonic cyst of cervix: Secondary | ICD-10-CM | POA: Diagnosis not present

## 2011-07-20 DIAGNOSIS — Z5189 Encounter for other specified aftercare: Secondary | ICD-10-CM | POA: Diagnosis not present

## 2011-07-20 DIAGNOSIS — F0390 Unspecified dementia without behavioral disturbance: Secondary | ICD-10-CM | POA: Diagnosis not present

## 2011-07-20 DIAGNOSIS — N390 Urinary tract infection, site not specified: Secondary | ICD-10-CM | POA: Diagnosis not present

## 2011-07-20 LAB — CULTURE, BLOOD (ROUTINE X 2)
Culture  Setup Time: 201302080035
Culture: NO GROWTH

## 2011-07-20 LAB — GLUCOSE, CAPILLARY: Glucose-Capillary: 142 mg/dL — ABNORMAL HIGH (ref 70–99)

## 2011-07-20 MED ORDER — DOXYCYCLINE HYCLATE 100 MG PO TABS: 100.0000 mg | ORAL_TABLET | Freq: Two times a day (BID) | ORAL | Status: AC

## 2011-07-20 MED ORDER — DM-GUAIFENESIN ER 30-600 MG PO TB12: 1.0000 | ORAL_TABLET | Freq: Two times a day (BID) | ORAL | Status: AC

## 2011-07-20 NOTE — Progress Notes (Signed)
Patient cleared for discharge. Patient accepted at [facility]. Packet copied and place in [place]. Transportation set up with [facility]. Daughter and son, mike, both notified.  Mandy Fitzwater C. Makiah Foye MSW, LCSW [phone]

## 2011-07-20 NOTE — Discharge Summary (Signed)
Physician Discharge Summary  LAMIA MARINER MRN: [id number] DOB/AGE: Nov 08, 1924 78 y.o.  PCP: Florentina Jenny, MD, MD   Admit date: 07/13/2011 Discharge date: 07/20/2011  Discharge Diagnoses:     *Pneumonia  Atrial fibrillation with controlled ventricular response  Hypertension  Diabetes mellitus  UTI (lower urinary tract infection)  Thrombocytopenia  Hypokalemia  Shoulder pain  Pancreatic mass  Dysphagia   Medication List  As of 07/20/2011 12:19 PM   STOP taking these medications         ondansetron 4 MG tablet         TAKE these medications         aspirin 81 MG chewable tablet   Chew 81 mg by mouth daily.      BAZA PROTECT EX   Apply 1 application topically as needed. To buttocks. May keep at bedside.      clopidogrel 75 MG tablet   Commonly known as: PLAVIX   Take 75 mg by mouth daily.      Cranberry 475 MG Caps   Take 1 capsule by mouth 2 (two) times daily.      [employer] VITAMIN D3 1000 UNITS capsule   Generic drug: Cholecalciferol   Take 1,000 Units by mouth daily.      dextromethorphan-guaiFENesin 30-600 MG per 12 hr tablet   Commonly known as: MUCINEX DM   Take 1 tablet by mouth 2 (two) times daily.      donepezil 5 MG tablet   Commonly known as: ARICEPT   Take 5 mg by mouth at bedtime.      dorzolamide 2 % ophthalmic solution   Commonly known as: TRUSOPT   Place 1 drop into both eyes 3 (three) times daily.      doxycycline 100 MG tablet   Commonly known as: VIBRA-TABS   Take 1 tablet (100 mg total) by mouth 2 (two) times daily.      furosemide 20 MG tablet   Commonly known as: LASIX   Take 20 mg by mouth daily.      gabapentin 100 MG capsule   Commonly known as: NEURONTIN   Take 100 mg by mouth 2 (two) times daily.      lisinopril 10 MG tablet   Commonly known as: PRINIVIL,ZESTRIL   Take 10 mg by mouth daily.      [facility] [facility]   Place vaginally every 7 (seven) days.      metFORMIN 500 MG tablet   Commonly known as:  GLUCOPHAGE   Take 500 mg by mouth 2 (two) times daily with a meal.      metoprolol tartrate 25 MG tablet   Commonly known as: LOPRESSOR   Take 1 tablet (25 mg total) by mouth 2 (two) times daily.      multivitamin Tabs tablet   Take 1 tablet by mouth daily.      olopatadine 0.1 % ophthalmic solution   Commonly known as: PATANOL   Place 1 drop into both eyes daily.      omeprazole 20 MG capsule   Commonly known as: PRILOSEC   Take 20 mg by mouth daily.      polyvinyl alcohol 1.4 % ophthalmic solution   Commonly known as: LIQUIFILM TEARS   Place 1 drop into both eyes daily.      potassium chloride SA 20 MEQ tablet   Commonly known as: K-DUR,KLOR-CON   Take 20 mEq by mouth daily.      pravastatin 20 MG  tablet   Commonly known as: PRAVACHOL   Take 20 mg by mouth at bedtime.      Protein Powd   Take 1 scoop by mouth 2 (two) times daily.      sertraline 25 MG tablet   Commonly known as: ZOLOFT   Take 25 mg by mouth daily.      SYSTANE 0.4-0.3 % Soln   Generic drug: Polyethyl Glycol-Propyl Glycol   Apply 1 drop to eye 4 (four) times daily.      Travoprost (BAK Free) 0.004 % Soln ophthalmic solution   Commonly known as: TRAVATAN   Place 1 drop into both eyes at bedtime.      traZODone 50 MG tablet   Commonly known as: DESYREL   Take 100 mg by mouth at bedtime.      vitamin B-12 500 MCG tablet   Commonly known as: CYANOCOBALAMIN   Take 500 mcg by mouth daily.            Discharge Condition: Stable Disposition: Home or Self Care   Consults: None  Significant Diagnostic Studies: Dg Chest 2 View  06/23/2011  *RADIOLOGY REPORT*  Clinical Data: Chills and cough.  CHEST - 2 VIEW  Comparison: Chest radiograph performed 05/22/2011  Findings: The lungs are well-aerated.  Vascular congestion is noted; mild left basilar atelectasis or scarring is again seen, stable from the prior study.  Mild right mid lung airspace opacity could reflect mild pneumonia.  This no longer  appears to reflect an overlying shadow.  There is no evidence of pleural effusion or pneumothorax.  The heart is enlarged; calcification is noted within the aortic arch.  No acute osseous abnormalities are seen.  Clips are noted within the right upper quadrant, reflecting prior cholecystectomy.  IMPRESSION:  1.  Mild right midlung airspace opacity could reflect mild pneumonia. 2.  Vascular congestion and cardiomegaly; mild left basilar atelectasis or scarring again seen.  Original Report Authenticated By: Tonia Ghent, M.D.   Dg Shoulder Right  07/16/2011  *RADIOLOGY REPORT*  Clinical Data: Right shoulder pain.  RIGHT SHOULDER - 2+ VIEW  Comparison: None  Findings: The joint spaces are maintained.  There are moderate AC joint degenerative changes and possible chondrocalcinosis. Deformity of the distal clavicle is likely due to a previous fracture.  No acute bony findings or evidence of avascular necrosis.  The right lung is grossly clear.  IMPRESSION:  1.  AC joint degenerative changes and possible chondrocalcinosis. 2.  Deformity of the distal clavicle likely due to prior fracture. 3.  No acute bony findings.  Original Report Authenticated By: P. Loralie Champagne, M.D.   Dg Chest Port 1 View  07/14/2011  *RADIOLOGY REPORT*  Clinical Data: Pneumonia.  PORTABLE CHEST - 1 VIEW  Comparison: 07/13/2011  Findings: Moderate cardiac enlargement.  No pleural effusion or pulmonary edema.  No airspace consolidation identified.  Stable scar versus atelectasis in the left base.  Pulmonary venous congestion is again noted.  Unchanged from prior exam.  IMPRESSION:  1.  Cardiac enlargement and pulmonary venous congestion. 2.  Left base scar versus atelectasis.  Original Report Authenticated By: Rosealee Albee, M.D.   Dg Chest Portable 1 View  07/13/2011  *RADIOLOGY REPORT*  Clinical Data: Body aches, weakness, shortness of breath, low oxygen saturation, cough and fever  PORTABLE CHEST - 1 VIEW  Comparison: March 22, 2012   Findings: There is cardiomegaly and mild pulmonary edema, unchanged.   Patchy opacity in the left lower lung remains present and  not significantly changed, likely atelectasis.  No effusions are identified.  IMPRESSION: Cardiomegaly and mild pulmonary edema.  Patchy left lower lung opacity is unchanged and is likely atelectasis.  Original Report Authenticated By: Brandon Melnick, M.D.      Microbiology: Recent Results (from the past 240 hour(s))  CULTURE, BLOOD (ROUTINE X 2)     Status: Normal   Collection Time   07/13/11  1:00 PM      Component Value Range Status Comment   Specimen Description BLOOD RIGHT ANTECUBITAL   Final    Special Requests BOTTLES DRAWN AEROBIC AND ANAEROBIC   Final    Culture  Setup Time [id number]   Final    Culture     Final    Value: STAPHYLOCOCCUS SPECIES (COAGULASE NEGATIVE)     Note: THE SIGNIFICANCE OF ISOLATING THIS ORGANISM FROM A SINGLE SET OF BLOOD CULTURES WHEN MULTIPLE SETS ARE DRAWN IS UNCERTAIN. PLEASE NOTIFY THE MICROBIOLOGY DEPARTMENT WITHIN ONE WEEK IF SPECIATION AND SENSITIVITIES ARE REQUIRED.     Note: Gram Stain Report Called to,Read Back By and Verified With: SHARLA ROBBS @ 2130 ON 07/14/11 BY GOLLD   Report Status 07/15/2011 FINAL   Final   CULTURE, BLOOD (ROUTINE X 2)     Status: Normal   Collection Time   07/13/11  1:05 PM      Component Value Range Status Comment   Specimen Description BLOOD LEFT ANTECUBITAL   Final    Special Requests BOTTLES DRAWN AEROBIC AND ANAEROBIC   Final    Culture  Setup Time [id number]   Final    Culture NO GROWTH 5 DAYS   Final    Report Status 07/20/2011 FINAL   Final   URINE CULTURE     Status: Normal   Collection Time   07/13/11  1:29 PM      Component Value Range Status Comment   Specimen Description URINE, CATHETERIZED   Final    Special Requests NONE   Final    Culture  Setup Time [id number]   Final    Colony Count >=100,000 COLONIES/ML   Final    Culture ENTEROCOCCUS SPECIES   Final    Report  Status 07/15/2011 FINAL   Final    Organism ID, Bacteria ENTEROCOCCUS SPECIES   Final   MRSA PCR SCREENING     Status: Normal   Collection Time   07/13/11  7:00 PM      Component Value Range Status Comment   MRSA by PCR NEGATIVE  NEGATIVE  Final      Labs: Results for orders placed during the hospital encounter of 07/13/11 (from the past 48 hour(s))  GLUCOSE, CAPILLARY     Status: Abnormal   Collection Time   07/18/11  5:22 PM      Component Value Range Comment   Glucose-Capillary 176 (*) 70 - 99 (mg/dL)   GLUCOSE, CAPILLARY     Status: Abnormal   Collection Time   07/18/11  9:33 PM      Component Value Range Comment   Glucose-Capillary 141 (*) 70 - 99 (mg/dL)    Comment 1 Notify RN      Comment 2 Documented in Chart     BASIC METABOLIC PANEL     Status: Abnormal   Collection Time   07/19/11  4:38 AM      Component Value Range Comment   Sodium 141  135 - 145 (mEq/L)    Potassium 3.5  3.5 - 5.1 (mEq/L)  Chloride 105  96 - 112 (mEq/L)    CO2 28  19 - 32 (mEq/L)    Glucose, Bld 152 (*) 70 - 99 (mg/dL)    BUN 14  6 - 23 (mg/dL)    Creatinine, Ser 8.65  0.50 - 1.10 (mg/dL)    Calcium 9.3  8.4 - 10.5 (mg/dL)    GFR calc non Af Amer 74 (*) >90 (mL/min)    GFR calc Af Amer 86 (*) >90 (mL/min)   CBC     Status: Abnormal   Collection Time   07/19/11  4:38 AM      Component Value Range Comment   WBC 4.6  4.0 - 10.5 (K/uL)    RBC 3.47 (*) 3.87 - 5.11 (MIL/uL)    Hemoglobin 10.9 (*) 12.0 - 15.0 (g/dL)    HCT 78.4 (*) 69.6 - 46.0 (%)    MCV 96.0  78.0 - 100.0 (fL)    MCH 31.4  26.0 - 34.0 (pg)    MCHC 32.7  30.0 - 36.0 (g/dL)    RDW 29.5  28.4 - 13.2 (%)    Platelets 143 (*) 150 - 400 (K/uL)   GLUCOSE, CAPILLARY     Status: Abnormal   Collection Time   07/19/11  7:55 AM      Component Value Range Comment   Glucose-Capillary 142 (*) 70 - 99 (mg/dL)   GLUCOSE, CAPILLARY     Status: Abnormal   Collection Time   07/19/11 11:50 AM      Component Value Range Comment    Glucose-Capillary 161 (*) 70 - 99 (mg/dL)   CARDIAC PANEL(CRET KIN+CKTOT+MB+TROPI)     Status: Normal   Collection Time   07/19/11 12:45 PM      Component Value Range Comment   Total CK 31  7 - 177 (U/L)    CK, MB 2.2  0.3 - 4.0 (ng/mL)    Troponin I <0.30  <0.30 (ng/mL)    Relative Index RELATIVE INDEX IS INVALID  0.0 - 2.5    GLUCOSE, CAPILLARY     Status: Abnormal   Collection Time   07/19/11  4:48 PM      Component Value Range Comment   Glucose-Capillary 172 (*) 70 - 99 (mg/dL)   GLUCOSE, CAPILLARY     Status: Abnormal   Collection Time   07/19/11 10:32 PM      Component Value Range Comment   Glucose-Capillary 125 (*) 70 - 99 (mg/dL)    Comment 1 Notify RN     GLUCOSE, CAPILLARY     Status: Abnormal   Collection Time   07/20/11  7:38 AM      Component Value Range Comment   Glucose-Capillary 142 (*) 70 - 99 (mg/dL)   GLUCOSE, CAPILLARY     Status: Abnormal   Collection Time   07/20/11 12:05 PM      Component Value Range Comment   Glucose-Capillary 176 (*) 70 - 99 (mg/dL)      HPI : 78 year old  patient is demented, history obtained from daughter and ER notes. She is 28 years old Caucasian woman nursing home resident with multiple comorbidities, was brought into the ER today with chief complaint of generalized weakness, cough productive of yellowish sputum with streaks of blood and fever of 101 at the facility. Associated with mild shortness of breath, she denies any chest pain. She is also complaining of nausea and vomiting as per patient she vomited red/orange material , however her daughter stated that patient  has been drinking cranberry juice regularly at the nursing home and she felt that it is what they were  referring to. There was no report from the nursing home for any hematemesis or hematochezia. In the ER UA was positive for pyuria, chest x-ray showed chronic changes as below. Patient was found to be febrile with a rectal temperature of 102 she, she was given Zosyn and  vancomycin and we were asked to admit for suspected pneumonia and UTI    HOSPITAL COURSE:  1. enterococcus>100,000 UTI and pneumonia., Initially the-patient was treated with Zosyn and Vanco and Levaquin.  IV antibiotic were discontinued after a total of 5 days and she was started on doxycycline by mouth based on sensitivities. She will continue this for another 7 days  #2 Pneumonia = patient was started on treatment with triple antibiotic therapy.   IV antibiotic have been changed to by mouth doxycycline  .A speech therapy consultation was obtained because of concern about aspiration, as per there assessment primary complaints of dysphagia are esophageal in nature.  Pt's swallow is likely further impaired currently due to pt not wearing dentures (pt does not have "Seabond" here and c/o "fever blister" and "sore spots" in her mouth). . Medications are being taken with applesauce and pt reports tolerance of this method. As pt has a history of esophageal dilatations (multiple by Dr Victorino Dike per history)and points to proximal esophagus to indicate area of food lodging, primary dysphagia symptoms are consistent with esophageal deficits with possible acute exacerbation with current illness. Pt further states she feels like she "fills up and has to wait for it to go down" pointing to her esophagus. A GI consultation is recommended if  the patient continues to have difficulty with swallowing, for possible esophageal dilatation.   She is recommended to have a regular diet with thin liquids .   2. Diastolic dysfunction-continue Lasix 20 by mouth, lisinopril 10, metoprolol 25 twice a day.   Atrial fibrillationShe reports these palpitations occur intermittently mainly throughout the day. She denies any associated sob, doe, syncope, diaphoresis, or other complaint. She report some pain primarily in her right shoulder but states this extends into her neck and occasionally across her chest; this is no exertional  in nature. continue beta blocker as above. Rate controlled,  She is not currently anticoagulated, though her CHADSVASC2 score was 4-5 and she would arguably benefit from Coumadin. Given her fall risks, the addition of anticoagulation with coumadin was defer to her new cardiologist.    3. Diabetes mellitus type 2 (A1c 7.7)-acceptable control on sliding scale. 4. Pancreatic lesion?-Discussed with the patient and her daughter. This is not a new finding. Per patient she discussed it with her primary care physician and because of her age no further workup is being done.  5. Dementia-continue Aricept, trazodone for sleep 6. Anemia-one point dropped and blood count and slightly low platelets, continue cyanocobalamin reassess with CBC in one week Shoulder pain: This was likely musculoskeletal in nature based on patient's description of symptoms and exam findings. EKG was without evidence of ischemia or other concerning coronary process, and cardiac enzymes were negative. Daughter reports that Ms. Heuer has been on Plavix since 2002 for "plaques in her arteries" but denies coronary stents or h/o CVA.We continued her on Plavix and will defer further assessment to Dr. Daleen Squibb [facility]).  Patient does have chronic shoulder pain. Did x-ray no acute finding 7. Hypokinemia: Replete 8. Epigastric Pain: Patient is on Protonix will try a  GI cocktail. 14. Code Status: Full. Family Communication: Discussed with daughter plan of care-all questions answered.  Disposition Plan: Discharge to skilled nursing facility. Discussed with her discharge today.    Discharge Exam:  Blood pressure 164/78, pulse 60, temperature 97.6 F (36.4 C), temperature source Oral, resp. rate 18, height 5\' 6"  (1.676 m), weight 77 kg (169 lb 12.1 oz), SpO2 97.00%.  General: Caucasian female in no cardiopulmonary distress  Cardiovascular:irregular rhythm  Respiratory: Clear bilat lower lung fields  Abdomen: soft nontender nondistended    Skin trace Lower extremity edema  Neuro grossly intact     Discharge Orders    Future Orders Please Complete By Expires   Diet - low sodium heart healthy      Increase activity slowly      Call MD for:  persistant nausea and vomiting      Call MD for:  temperature >100.4      Call MD for:  severe uncontrolled pain         Follow-up Information    Follow up with TRIPP,HENRY, MD .        CBC in one week BMP in one week  Signed: Richarda Overlie 07/20/2011, 12:19 PM

## 2011-07-26 DIAGNOSIS — N390 Urinary tract infection, site not specified: Secondary | ICD-10-CM | POA: Diagnosis not present

## 2011-07-26 DIAGNOSIS — I4891 Unspecified atrial fibrillation: Secondary | ICD-10-CM | POA: Diagnosis not present

## 2011-07-26 DIAGNOSIS — J189 Pneumonia, unspecified organism: Secondary | ICD-10-CM | POA: Diagnosis not present

## 2011-08-10 DIAGNOSIS — B029 Zoster without complications: Secondary | ICD-10-CM | POA: Diagnosis not present

## 2011-08-10 DIAGNOSIS — I10 Essential (primary) hypertension: Secondary | ICD-10-CM | POA: Diagnosis not present

## 2011-08-22 DIAGNOSIS — B029 Zoster without complications: Secondary | ICD-10-CM | POA: Diagnosis not present

## 2011-08-22 DIAGNOSIS — I10 Essential (primary) hypertension: Secondary | ICD-10-CM | POA: Diagnosis not present

## 2011-08-25 DIAGNOSIS — R609 Edema, unspecified: Secondary | ICD-10-CM | POA: Diagnosis not present

## 2011-08-25 DIAGNOSIS — I10 Essential (primary) hypertension: Secondary | ICD-10-CM | POA: Diagnosis not present

## 2011-09-01 DIAGNOSIS — I10 Essential (primary) hypertension: Secondary | ICD-10-CM | POA: Diagnosis not present

## 2011-09-01 DIAGNOSIS — I4891 Unspecified atrial fibrillation: Secondary | ICD-10-CM | POA: Diagnosis not present

## 2011-09-01 DIAGNOSIS — R609 Edema, unspecified: Secondary | ICD-10-CM | POA: Diagnosis not present

## 2011-09-01 DIAGNOSIS — F0390 Unspecified dementia without behavioral disturbance: Secondary | ICD-10-CM | POA: Diagnosis not present

## 2011-09-01 DIAGNOSIS — E785 Hyperlipidemia, unspecified: Secondary | ICD-10-CM | POA: Diagnosis not present

## 2011-09-18 DIAGNOSIS — I4891 Unspecified atrial fibrillation: Secondary | ICD-10-CM | POA: Diagnosis not present

## 2011-09-18 DIAGNOSIS — M542 Cervicalgia: Secondary | ICD-10-CM | POA: Diagnosis not present

## 2011-09-18 DIAGNOSIS — E785 Hyperlipidemia, unspecified: Secondary | ICD-10-CM | POA: Diagnosis not present

## 2011-09-22 DIAGNOSIS — E785 Hyperlipidemia, unspecified: Secondary | ICD-10-CM | POA: Diagnosis not present

## 2011-09-22 DIAGNOSIS — N390 Urinary tract infection, site not specified: Secondary | ICD-10-CM | POA: Diagnosis not present

## 2011-09-22 DIAGNOSIS — I4891 Unspecified atrial fibrillation: Secondary | ICD-10-CM | POA: Diagnosis not present

## 2011-10-04 DIAGNOSIS — B3749 Other urogenital candidiasis: Secondary | ICD-10-CM | POA: Diagnosis not present

## 2011-10-17 DIAGNOSIS — N9489 Other specified conditions associated with female genital organs and menstrual cycle: Secondary | ICD-10-CM | POA: Diagnosis not present

## 2011-10-24 DIAGNOSIS — Q516 Embryonic cyst of cervix: Secondary | ICD-10-CM | POA: Diagnosis not present

## 2011-10-31 DIAGNOSIS — N9089 Other specified noninflammatory disorders of vulva and perineum: Secondary | ICD-10-CM | POA: Diagnosis not present

## 2011-10-31 DIAGNOSIS — D280 Benign neoplasm of vulva: Secondary | ICD-10-CM | POA: Diagnosis not present

## 2011-11-08 DIAGNOSIS — R609 Edema, unspecified: Secondary | ICD-10-CM | POA: Diagnosis not present

## 2011-11-08 DIAGNOSIS — I4891 Unspecified atrial fibrillation: Secondary | ICD-10-CM | POA: Diagnosis not present

## 2011-11-08 DIAGNOSIS — E785 Hyperlipidemia, unspecified: Secondary | ICD-10-CM | POA: Diagnosis not present

## 2011-11-08 DIAGNOSIS — H6120 Impacted cerumen, unspecified ear: Secondary | ICD-10-CM | POA: Diagnosis not present

## 2011-11-08 DIAGNOSIS — I10 Essential (primary) hypertension: Secondary | ICD-10-CM | POA: Diagnosis not present

## 2011-11-15 DIAGNOSIS — N9089 Other specified noninflammatory disorders of vulva and perineum: Secondary | ICD-10-CM | POA: Diagnosis not present

## 2011-11-24 DIAGNOSIS — R6889 Other general symptoms and signs: Secondary | ICD-10-CM | POA: Diagnosis not present

## 2011-11-24 DIAGNOSIS — E785 Hyperlipidemia, unspecified: Secondary | ICD-10-CM | POA: Diagnosis not present

## 2011-11-24 DIAGNOSIS — I4891 Unspecified atrial fibrillation: Secondary | ICD-10-CM | POA: Diagnosis not present

## 2011-12-05 DIAGNOSIS — E785 Hyperlipidemia, unspecified: Secondary | ICD-10-CM | POA: Diagnosis not present

## 2011-12-05 DIAGNOSIS — R609 Edema, unspecified: Secondary | ICD-10-CM | POA: Diagnosis not present

## 2011-12-05 DIAGNOSIS — I4891 Unspecified atrial fibrillation: Secondary | ICD-10-CM | POA: Diagnosis not present

## 2011-12-05 DIAGNOSIS — I10 Essential (primary) hypertension: Secondary | ICD-10-CM | POA: Diagnosis not present

## 2011-12-12 DIAGNOSIS — R51 Headache: Secondary | ICD-10-CM | POA: Diagnosis not present

## 2011-12-12 DIAGNOSIS — I10 Essential (primary) hypertension: Secondary | ICD-10-CM | POA: Diagnosis not present

## 2011-12-12 DIAGNOSIS — F329 Major depressive disorder, single episode, unspecified: Secondary | ICD-10-CM | POA: Diagnosis not present

## 2012-01-03 DIAGNOSIS — R51 Headache: Secondary | ICD-10-CM | POA: Diagnosis not present

## 2012-01-03 DIAGNOSIS — R413 Other amnesia: Secondary | ICD-10-CM | POA: Diagnosis not present

## 2012-01-04 ENCOUNTER — Other Ambulatory Visit: Payer: Self-pay | Admitting: Neurology

## 2012-01-04 DIAGNOSIS — R51 Headache: Secondary | ICD-10-CM

## 2012-01-11 DIAGNOSIS — I4891 Unspecified atrial fibrillation: Secondary | ICD-10-CM | POA: Diagnosis not present

## 2012-01-12 ENCOUNTER — Ambulatory Visit
Admission: RE | Admit: 2012-01-12 | Discharge: 2012-01-12 | Disposition: A | Discharge status: Home / Self Care | Payer: Medicare Other | Source: Ambulatory Visit | Attending: Neurology | Admitting: Neurology

## 2012-01-12 ENCOUNTER — Other Ambulatory Visit: Payer: Self-pay | Admitting: Neurology

## 2012-01-12 DIAGNOSIS — Z139 Encounter for screening, unspecified: Secondary | ICD-10-CM

## 2012-01-12 DIAGNOSIS — R51 Headache: Secondary | ICD-10-CM

## 2012-01-12 DIAGNOSIS — R930 Abnormal findings on diagnostic imaging of skull and head, not elsewhere classified: Secondary | ICD-10-CM | POA: Diagnosis not present

## 2012-01-12 DIAGNOSIS — G319 Degenerative disease of nervous system, unspecified: Secondary | ICD-10-CM | POA: Diagnosis not present

## 2012-01-12 MED ORDER — GADOBENATE DIMEGLUMINE 529 MG/ML IV SOLN
15.0000 mL | Freq: Once | INTRAVENOUS | Status: AC | PRN
  Administered 2012-01-12: 15 mL via INTRAVENOUS

## 2012-01-13 DIAGNOSIS — R5381 Other malaise: Secondary | ICD-10-CM | POA: Diagnosis not present

## 2012-01-13 DIAGNOSIS — M6281 Muscle weakness (generalized): Secondary | ICD-10-CM | POA: Diagnosis not present

## 2012-01-13 DIAGNOSIS — Z5189 Encounter for other specified aftercare: Secondary | ICD-10-CM | POA: Diagnosis not present

## 2012-01-16 DIAGNOSIS — Z5189 Encounter for other specified aftercare: Secondary | ICD-10-CM | POA: Diagnosis not present

## 2012-01-16 DIAGNOSIS — M6281 Muscle weakness (generalized): Secondary | ICD-10-CM | POA: Diagnosis not present

## 2012-01-16 DIAGNOSIS — R5381 Other malaise: Secondary | ICD-10-CM | POA: Diagnosis not present

## 2012-01-18 DIAGNOSIS — R5381 Other malaise: Secondary | ICD-10-CM | POA: Diagnosis not present

## 2012-01-18 DIAGNOSIS — M109 Gout, unspecified: Secondary | ICD-10-CM | POA: Diagnosis not present

## 2012-01-18 DIAGNOSIS — D649 Anemia, unspecified: Secondary | ICD-10-CM | POA: Diagnosis not present

## 2012-01-18 DIAGNOSIS — D531 Other megaloblastic anemias, not elsewhere classified: Secondary | ICD-10-CM | POA: Diagnosis not present

## 2012-01-18 DIAGNOSIS — Z5189 Encounter for other specified aftercare: Secondary | ICD-10-CM | POA: Diagnosis not present

## 2012-01-18 DIAGNOSIS — E785 Hyperlipidemia, unspecified: Secondary | ICD-10-CM | POA: Diagnosis not present

## 2012-01-18 DIAGNOSIS — M6281 Muscle weakness (generalized): Secondary | ICD-10-CM | POA: Diagnosis not present

## 2012-01-22 DIAGNOSIS — Z5189 Encounter for other specified aftercare: Secondary | ICD-10-CM | POA: Diagnosis not present

## 2012-01-22 DIAGNOSIS — M6281 Muscle weakness (generalized): Secondary | ICD-10-CM | POA: Diagnosis not present

## 2012-01-22 DIAGNOSIS — R5381 Other malaise: Secondary | ICD-10-CM | POA: Diagnosis not present

## 2012-01-24 DIAGNOSIS — M79609 Pain in unspecified limb: Secondary | ICD-10-CM | POA: Diagnosis not present

## 2012-01-24 DIAGNOSIS — M6281 Muscle weakness (generalized): Secondary | ICD-10-CM | POA: Diagnosis not present

## 2012-01-24 DIAGNOSIS — Z5189 Encounter for other specified aftercare: Secondary | ICD-10-CM | POA: Diagnosis not present

## 2012-01-24 DIAGNOSIS — R5381 Other malaise: Secondary | ICD-10-CM | POA: Diagnosis not present

## 2012-01-24 DIAGNOSIS — B351 Tinea unguium: Secondary | ICD-10-CM | POA: Diagnosis not present

## 2012-01-26 DIAGNOSIS — I4891 Unspecified atrial fibrillation: Secondary | ICD-10-CM | POA: Diagnosis not present

## 2012-01-26 DIAGNOSIS — M6281 Muscle weakness (generalized): Secondary | ICD-10-CM | POA: Diagnosis not present

## 2012-01-26 DIAGNOSIS — I10 Essential (primary) hypertension: Secondary | ICD-10-CM | POA: Diagnosis not present

## 2012-01-26 DIAGNOSIS — G609 Hereditary and idiopathic neuropathy, unspecified: Secondary | ICD-10-CM | POA: Diagnosis not present

## 2012-01-26 DIAGNOSIS — E785 Hyperlipidemia, unspecified: Secondary | ICD-10-CM | POA: Diagnosis not present

## 2012-01-26 DIAGNOSIS — R5381 Other malaise: Secondary | ICD-10-CM | POA: Diagnosis not present

## 2012-01-26 DIAGNOSIS — Z79899 Other long term (current) drug therapy: Secondary | ICD-10-CM | POA: Diagnosis not present

## 2012-01-26 DIAGNOSIS — Z5189 Encounter for other specified aftercare: Secondary | ICD-10-CM | POA: Diagnosis not present

## 2012-01-29 DIAGNOSIS — Z79899 Other long term (current) drug therapy: Secondary | ICD-10-CM | POA: Diagnosis not present

## 2012-01-29 DIAGNOSIS — R5381 Other malaise: Secondary | ICD-10-CM | POA: Diagnosis not present

## 2012-01-29 DIAGNOSIS — E119 Type 2 diabetes mellitus without complications: Secondary | ICD-10-CM | POA: Diagnosis not present

## 2012-01-29 DIAGNOSIS — Z5189 Encounter for other specified aftercare: Secondary | ICD-10-CM | POA: Diagnosis not present

## 2012-01-29 DIAGNOSIS — M6281 Muscle weakness (generalized): Secondary | ICD-10-CM | POA: Diagnosis not present

## 2012-01-31 DIAGNOSIS — R5381 Other malaise: Secondary | ICD-10-CM | POA: Diagnosis not present

## 2012-01-31 DIAGNOSIS — M6281 Muscle weakness (generalized): Secondary | ICD-10-CM | POA: Diagnosis not present

## 2012-01-31 DIAGNOSIS — Z5189 Encounter for other specified aftercare: Secondary | ICD-10-CM | POA: Diagnosis not present

## 2012-02-02 DIAGNOSIS — R5381 Other malaise: Secondary | ICD-10-CM | POA: Diagnosis not present

## 2012-02-02 DIAGNOSIS — Z5189 Encounter for other specified aftercare: Secondary | ICD-10-CM | POA: Diagnosis not present

## 2012-02-02 DIAGNOSIS — M6281 Muscle weakness (generalized): Secondary | ICD-10-CM | POA: Diagnosis not present

## 2012-02-05 DIAGNOSIS — M6281 Muscle weakness (generalized): Secondary | ICD-10-CM | POA: Diagnosis not present

## 2012-02-05 DIAGNOSIS — Z5189 Encounter for other specified aftercare: Secondary | ICD-10-CM | POA: Diagnosis not present

## 2012-02-05 DIAGNOSIS — M25549 Pain in joints of unspecified hand: Secondary | ICD-10-CM | POA: Diagnosis not present

## 2012-02-05 DIAGNOSIS — R5381 Other malaise: Secondary | ICD-10-CM | POA: Diagnosis not present

## 2012-02-07 DIAGNOSIS — Z5189 Encounter for other specified aftercare: Secondary | ICD-10-CM | POA: Diagnosis not present

## 2012-02-07 DIAGNOSIS — R5381 Other malaise: Secondary | ICD-10-CM | POA: Diagnosis not present

## 2012-02-07 DIAGNOSIS — Z79899 Other long term (current) drug therapy: Secondary | ICD-10-CM | POA: Diagnosis not present

## 2012-02-07 DIAGNOSIS — M25549 Pain in joints of unspecified hand: Secondary | ICD-10-CM | POA: Diagnosis not present

## 2012-02-07 DIAGNOSIS — M6281 Muscle weakness (generalized): Secondary | ICD-10-CM | POA: Diagnosis not present

## 2012-02-12 DIAGNOSIS — R51 Headache: Secondary | ICD-10-CM | POA: Diagnosis not present

## 2012-02-12 DIAGNOSIS — M25549 Pain in joints of unspecified hand: Secondary | ICD-10-CM | POA: Diagnosis not present

## 2012-02-12 DIAGNOSIS — Z5189 Encounter for other specified aftercare: Secondary | ICD-10-CM | POA: Diagnosis not present

## 2012-02-12 DIAGNOSIS — M6281 Muscle weakness (generalized): Secondary | ICD-10-CM | POA: Diagnosis not present

## 2012-02-12 DIAGNOSIS — F0390 Unspecified dementia without behavioral disturbance: Secondary | ICD-10-CM | POA: Diagnosis not present

## 2012-02-12 DIAGNOSIS — D649 Anemia, unspecified: Secondary | ICD-10-CM | POA: Diagnosis not present

## 2012-02-12 DIAGNOSIS — R5381 Other malaise: Secondary | ICD-10-CM | POA: Diagnosis not present

## 2012-02-12 DIAGNOSIS — I6990 Unspecified sequelae of unspecified cerebrovascular disease: Secondary | ICD-10-CM | POA: Diagnosis not present

## 2012-02-14 DIAGNOSIS — Z5189 Encounter for other specified aftercare: Secondary | ICD-10-CM | POA: Diagnosis not present

## 2012-02-14 DIAGNOSIS — M6281 Muscle weakness (generalized): Secondary | ICD-10-CM | POA: Diagnosis not present

## 2012-02-14 DIAGNOSIS — M25549 Pain in joints of unspecified hand: Secondary | ICD-10-CM | POA: Diagnosis not present

## 2012-02-14 DIAGNOSIS — R5381 Other malaise: Secondary | ICD-10-CM | POA: Diagnosis not present

## 2012-02-16 DIAGNOSIS — R5381 Other malaise: Secondary | ICD-10-CM | POA: Diagnosis not present

## 2012-02-16 DIAGNOSIS — M6281 Muscle weakness (generalized): Secondary | ICD-10-CM | POA: Diagnosis not present

## 2012-02-16 DIAGNOSIS — Z5189 Encounter for other specified aftercare: Secondary | ICD-10-CM | POA: Diagnosis not present

## 2012-02-16 DIAGNOSIS — M25549 Pain in joints of unspecified hand: Secondary | ICD-10-CM | POA: Diagnosis not present

## 2012-02-19 DIAGNOSIS — M6281 Muscle weakness (generalized): Secondary | ICD-10-CM | POA: Diagnosis not present

## 2012-02-19 DIAGNOSIS — M25549 Pain in joints of unspecified hand: Secondary | ICD-10-CM | POA: Diagnosis not present

## 2012-02-19 DIAGNOSIS — Z5189 Encounter for other specified aftercare: Secondary | ICD-10-CM | POA: Diagnosis not present

## 2012-02-19 DIAGNOSIS — R5381 Other malaise: Secondary | ICD-10-CM | POA: Diagnosis not present

## 2012-02-20 DIAGNOSIS — M25549 Pain in joints of unspecified hand: Secondary | ICD-10-CM | POA: Diagnosis not present

## 2012-02-20 DIAGNOSIS — R5381 Other malaise: Secondary | ICD-10-CM | POA: Diagnosis not present

## 2012-02-20 DIAGNOSIS — Z5189 Encounter for other specified aftercare: Secondary | ICD-10-CM | POA: Diagnosis not present

## 2012-02-20 DIAGNOSIS — M6281 Muscle weakness (generalized): Secondary | ICD-10-CM | POA: Diagnosis not present

## 2012-02-21 DIAGNOSIS — Z5189 Encounter for other specified aftercare: Secondary | ICD-10-CM | POA: Diagnosis not present

## 2012-02-21 DIAGNOSIS — M25549 Pain in joints of unspecified hand: Secondary | ICD-10-CM | POA: Diagnosis not present

## 2012-02-21 DIAGNOSIS — R5381 Other malaise: Secondary | ICD-10-CM | POA: Diagnosis not present

## 2012-02-21 DIAGNOSIS — M6281 Muscle weakness (generalized): Secondary | ICD-10-CM | POA: Diagnosis not present

## 2012-02-22 DIAGNOSIS — M6281 Muscle weakness (generalized): Secondary | ICD-10-CM | POA: Diagnosis not present

## 2012-02-22 DIAGNOSIS — M25549 Pain in joints of unspecified hand: Secondary | ICD-10-CM | POA: Diagnosis not present

## 2012-02-22 DIAGNOSIS — R5381 Other malaise: Secondary | ICD-10-CM | POA: Diagnosis not present

## 2012-02-22 DIAGNOSIS — Z5189 Encounter for other specified aftercare: Secondary | ICD-10-CM | POA: Diagnosis not present

## 2012-02-23 DIAGNOSIS — Z5189 Encounter for other specified aftercare: Secondary | ICD-10-CM | POA: Diagnosis not present

## 2012-02-23 DIAGNOSIS — M6281 Muscle weakness (generalized): Secondary | ICD-10-CM | POA: Diagnosis not present

## 2012-02-23 DIAGNOSIS — M25549 Pain in joints of unspecified hand: Secondary | ICD-10-CM | POA: Diagnosis not present

## 2012-02-23 DIAGNOSIS — R5381 Other malaise: Secondary | ICD-10-CM | POA: Diagnosis not present

## 2012-02-26 DIAGNOSIS — R5381 Other malaise: Secondary | ICD-10-CM | POA: Diagnosis not present

## 2012-02-26 DIAGNOSIS — M25549 Pain in joints of unspecified hand: Secondary | ICD-10-CM | POA: Diagnosis not present

## 2012-02-26 DIAGNOSIS — Z5189 Encounter for other specified aftercare: Secondary | ICD-10-CM | POA: Diagnosis not present

## 2012-02-26 DIAGNOSIS — M6281 Muscle weakness (generalized): Secondary | ICD-10-CM | POA: Diagnosis not present

## 2012-02-27 DIAGNOSIS — M25549 Pain in joints of unspecified hand: Secondary | ICD-10-CM | POA: Diagnosis not present

## 2012-02-27 DIAGNOSIS — R5381 Other malaise: Secondary | ICD-10-CM | POA: Diagnosis not present

## 2012-02-27 DIAGNOSIS — Z5189 Encounter for other specified aftercare: Secondary | ICD-10-CM | POA: Diagnosis not present

## 2012-02-27 DIAGNOSIS — M6281 Muscle weakness (generalized): Secondary | ICD-10-CM | POA: Diagnosis not present

## 2012-02-28 DIAGNOSIS — Z5189 Encounter for other specified aftercare: Secondary | ICD-10-CM | POA: Diagnosis not present

## 2012-02-28 DIAGNOSIS — M6281 Muscle weakness (generalized): Secondary | ICD-10-CM | POA: Diagnosis not present

## 2012-02-28 DIAGNOSIS — R5381 Other malaise: Secondary | ICD-10-CM | POA: Diagnosis not present

## 2012-02-28 DIAGNOSIS — M25549 Pain in joints of unspecified hand: Secondary | ICD-10-CM | POA: Diagnosis not present

## 2012-02-29 DIAGNOSIS — Z5189 Encounter for other specified aftercare: Secondary | ICD-10-CM | POA: Diagnosis not present

## 2012-02-29 DIAGNOSIS — R5381 Other malaise: Secondary | ICD-10-CM | POA: Diagnosis not present

## 2012-02-29 DIAGNOSIS — M6281 Muscle weakness (generalized): Secondary | ICD-10-CM | POA: Diagnosis not present

## 2012-02-29 DIAGNOSIS — M25549 Pain in joints of unspecified hand: Secondary | ICD-10-CM | POA: Diagnosis not present

## 2012-03-01 DIAGNOSIS — Z5189 Encounter for other specified aftercare: Secondary | ICD-10-CM | POA: Diagnosis not present

## 2012-03-01 DIAGNOSIS — M6281 Muscle weakness (generalized): Secondary | ICD-10-CM | POA: Diagnosis not present

## 2012-03-01 DIAGNOSIS — M25549 Pain in joints of unspecified hand: Secondary | ICD-10-CM | POA: Diagnosis not present

## 2012-03-01 DIAGNOSIS — R5381 Other malaise: Secondary | ICD-10-CM | POA: Diagnosis not present

## 2012-03-04 DIAGNOSIS — M6281 Muscle weakness (generalized): Secondary | ICD-10-CM | POA: Diagnosis not present

## 2012-03-04 DIAGNOSIS — R5381 Other malaise: Secondary | ICD-10-CM | POA: Diagnosis not present

## 2012-03-04 DIAGNOSIS — M25549 Pain in joints of unspecified hand: Secondary | ICD-10-CM | POA: Diagnosis not present

## 2012-03-04 DIAGNOSIS — Z5189 Encounter for other specified aftercare: Secondary | ICD-10-CM | POA: Diagnosis not present

## 2012-03-05 DIAGNOSIS — M6281 Muscle weakness (generalized): Secondary | ICD-10-CM | POA: Diagnosis not present

## 2012-03-06 DIAGNOSIS — M6281 Muscle weakness (generalized): Secondary | ICD-10-CM | POA: Diagnosis not present

## 2012-03-07 DIAGNOSIS — M6281 Muscle weakness (generalized): Secondary | ICD-10-CM | POA: Diagnosis not present

## 2012-03-08 DIAGNOSIS — M6281 Muscle weakness (generalized): Secondary | ICD-10-CM | POA: Diagnosis not present

## 2012-03-11 DIAGNOSIS — D649 Anemia, unspecified: Secondary | ICD-10-CM | POA: Diagnosis not present

## 2012-03-11 DIAGNOSIS — M6281 Muscle weakness (generalized): Secondary | ICD-10-CM | POA: Diagnosis not present

## 2012-03-12 DIAGNOSIS — H47219 Primary optic atrophy, unspecified eye: Secondary | ICD-10-CM | POA: Diagnosis not present

## 2012-03-12 DIAGNOSIS — M6281 Muscle weakness (generalized): Secondary | ICD-10-CM | POA: Diagnosis not present

## 2012-03-12 DIAGNOSIS — H4011X0 Primary open-angle glaucoma, stage unspecified: Secondary | ICD-10-CM | POA: Diagnosis not present

## 2012-03-12 DIAGNOSIS — E119 Type 2 diabetes mellitus without complications: Secondary | ICD-10-CM | POA: Diagnosis not present

## 2012-03-13 DIAGNOSIS — M6281 Muscle weakness (generalized): Secondary | ICD-10-CM | POA: Diagnosis not present

## 2012-03-14 DIAGNOSIS — M6281 Muscle weakness (generalized): Secondary | ICD-10-CM | POA: Diagnosis not present

## 2012-03-15 DIAGNOSIS — M6281 Muscle weakness (generalized): Secondary | ICD-10-CM | POA: Diagnosis not present

## 2012-03-16 DIAGNOSIS — M6281 Muscle weakness (generalized): Secondary | ICD-10-CM | POA: Diagnosis not present

## 2012-03-21 DIAGNOSIS — I669 Occlusion and stenosis of unspecified cerebral artery: Secondary | ICD-10-CM | POA: Diagnosis not present

## 2012-03-21 DIAGNOSIS — R269 Unspecified abnormalities of gait and mobility: Secondary | ICD-10-CM | POA: Diagnosis not present

## 2012-03-26 DIAGNOSIS — I4891 Unspecified atrial fibrillation: Secondary | ICD-10-CM | POA: Diagnosis not present

## 2012-03-26 DIAGNOSIS — F329 Major depressive disorder, single episode, unspecified: Secondary | ICD-10-CM | POA: Diagnosis not present

## 2012-03-26 DIAGNOSIS — I10 Essential (primary) hypertension: Secondary | ICD-10-CM | POA: Diagnosis not present

## 2012-03-26 DIAGNOSIS — E785 Hyperlipidemia, unspecified: Secondary | ICD-10-CM | POA: Diagnosis not present

## 2012-03-26 DIAGNOSIS — G609 Hereditary and idiopathic neuropathy, unspecified: Secondary | ICD-10-CM | POA: Diagnosis not present

## 2012-03-26 DIAGNOSIS — M25549 Pain in joints of unspecified hand: Secondary | ICD-10-CM | POA: Diagnosis not present

## 2012-04-02 DIAGNOSIS — H4011X0 Primary open-angle glaucoma, stage unspecified: Secondary | ICD-10-CM | POA: Diagnosis not present

## 2012-04-02 DIAGNOSIS — H04129 Dry eye syndrome of unspecified lacrimal gland: Secondary | ICD-10-CM | POA: Diagnosis not present

## 2012-04-02 DIAGNOSIS — Z961 Presence of intraocular lens: Secondary | ICD-10-CM | POA: Diagnosis not present

## 2012-04-15 DIAGNOSIS — R413 Other amnesia: Secondary | ICD-10-CM | POA: Diagnosis not present

## 2012-04-15 DIAGNOSIS — I6990 Unspecified sequelae of unspecified cerebrovascular disease: Secondary | ICD-10-CM | POA: Diagnosis not present

## 2012-04-15 DIAGNOSIS — R51 Headache: Secondary | ICD-10-CM | POA: Diagnosis not present

## 2012-04-22 DIAGNOSIS — I4891 Unspecified atrial fibrillation: Secondary | ICD-10-CM | POA: Diagnosis not present

## 2012-04-22 DIAGNOSIS — E785 Hyperlipidemia, unspecified: Secondary | ICD-10-CM | POA: Diagnosis not present

## 2012-04-22 DIAGNOSIS — G609 Hereditary and idiopathic neuropathy, unspecified: Secondary | ICD-10-CM | POA: Diagnosis not present

## 2012-04-22 DIAGNOSIS — I10 Essential (primary) hypertension: Secondary | ICD-10-CM | POA: Diagnosis not present

## 2012-04-22 DIAGNOSIS — F329 Major depressive disorder, single episode, unspecified: Secondary | ICD-10-CM | POA: Diagnosis not present

## 2012-05-20 DIAGNOSIS — K5900 Constipation, unspecified: Secondary | ICD-10-CM | POA: Diagnosis not present

## 2012-05-20 DIAGNOSIS — F0390 Unspecified dementia without behavioral disturbance: Secondary | ICD-10-CM | POA: Diagnosis not present

## 2012-05-20 DIAGNOSIS — I10 Essential (primary) hypertension: Secondary | ICD-10-CM | POA: Diagnosis not present

## 2012-05-24 DIAGNOSIS — I4891 Unspecified atrial fibrillation: Secondary | ICD-10-CM | POA: Diagnosis not present

## 2012-05-24 DIAGNOSIS — K5900 Constipation, unspecified: Secondary | ICD-10-CM | POA: Diagnosis not present

## 2012-05-24 DIAGNOSIS — Z79899 Other long term (current) drug therapy: Secondary | ICD-10-CM | POA: Diagnosis not present

## 2012-05-24 DIAGNOSIS — E785 Hyperlipidemia, unspecified: Secondary | ICD-10-CM | POA: Diagnosis not present

## 2012-05-24 DIAGNOSIS — I10 Essential (primary) hypertension: Secondary | ICD-10-CM | POA: Diagnosis not present

## 2012-05-27 DIAGNOSIS — Z5189 Encounter for other specified aftercare: Secondary | ICD-10-CM | POA: Diagnosis not present

## 2012-05-31 DIAGNOSIS — I4891 Unspecified atrial fibrillation: Secondary | ICD-10-CM | POA: Diagnosis not present

## 2012-05-31 DIAGNOSIS — I10 Essential (primary) hypertension: Secondary | ICD-10-CM | POA: Diagnosis not present

## 2012-05-31 DIAGNOSIS — I498 Other specified cardiac arrhythmias: Secondary | ICD-10-CM | POA: Diagnosis not present

## 2012-06-06 DIAGNOSIS — H6120 Impacted cerumen, unspecified ear: Secondary | ICD-10-CM | POA: Diagnosis not present

## 2012-06-06 DIAGNOSIS — L539 Erythematous condition, unspecified: Secondary | ICD-10-CM | POA: Diagnosis not present

## 2012-06-06 DIAGNOSIS — I10 Essential (primary) hypertension: Secondary | ICD-10-CM | POA: Diagnosis not present

## 2012-06-06 DIAGNOSIS — F0390 Unspecified dementia without behavioral disturbance: Secondary | ICD-10-CM | POA: Diagnosis not present

## 2012-06-20 DIAGNOSIS — I10 Essential (primary) hypertension: Secondary | ICD-10-CM | POA: Diagnosis not present

## 2012-06-20 DIAGNOSIS — L679 Hair color and hair shaft abnormality, unspecified: Secondary | ICD-10-CM | POA: Diagnosis not present

## 2012-06-24 DIAGNOSIS — H905 Unspecified sensorineural hearing loss: Secondary | ICD-10-CM | POA: Diagnosis not present

## 2012-06-24 DIAGNOSIS — H9190 Unspecified hearing loss, unspecified ear: Secondary | ICD-10-CM | POA: Diagnosis not present

## 2012-06-24 DIAGNOSIS — R1310 Dysphagia, unspecified: Secondary | ICD-10-CM | POA: Diagnosis not present

## 2012-06-24 DIAGNOSIS — H9209 Otalgia, unspecified ear: Secondary | ICD-10-CM | POA: Diagnosis not present

## 2012-06-25 ENCOUNTER — Other Ambulatory Visit (HOSPITAL_COMMUNITY): Payer: Self-pay | Admitting: Otolaryngology

## 2012-06-25 DIAGNOSIS — R1310 Dysphagia, unspecified: Secondary | ICD-10-CM

## 2012-06-26 DIAGNOSIS — M79609 Pain in unspecified limb: Secondary | ICD-10-CM | POA: Diagnosis not present

## 2012-06-26 DIAGNOSIS — B351 Tinea unguium: Secondary | ICD-10-CM | POA: Diagnosis not present

## 2012-07-02 ENCOUNTER — Ambulatory Visit (HOSPITAL_COMMUNITY)
Admission: RE | Admit: 2012-07-02 | Discharge: 2012-07-02 | Disposition: A | Discharge status: Home / Self Care | Payer: Medicare Other | Source: Ambulatory Visit | Attending: Otolaryngology | Admitting: Otolaryngology

## 2012-07-02 DIAGNOSIS — R1310 Dysphagia, unspecified: Secondary | ICD-10-CM

## 2012-07-02 DIAGNOSIS — R6889 Other general symptoms and signs: Secondary | ICD-10-CM | POA: Diagnosis not present

## 2012-07-02 DIAGNOSIS — K224 Dyskinesia of esophagus: Secondary | ICD-10-CM

## 2012-07-02 HISTORY — DX: Dyskinesia of esophagus: K22.4

## 2012-07-02 NOTE — Procedures (Addendum)
Objective Swallowing Evaluation: Modified Barium Swallowing Study  Patient Details  Name: Maria Christian MRN: [id number] Date of Birth: Oct 17, 1924  Today's Date: 07/02/2012 Time: 1308-6578 SLP Time Calculation (min): 28 min  Past Medical History:  Past Medical History  Diagnosis Date  . Atrial fibrillation   . Altered mental status   . Encephalopathy   . Parkinson disease   . Hypertension   . GERD (gastroesophageal reflux disease)   . Hyperlipemia   . Diabetes mellitus   . Coronary artery disease   . History of adenomatous polyp of colon 07/02/2011  . Enlarged heart   . DEMENTIA   . Edema of lower extremity 07/13/11    right leg more swollen than left leg   Past Surgical History:  Past Surgical History  Procedure Date  . Left shoulder surgury 2001    left clavicle excision and acromioplasty  . Abdominal hysterectomy   . Cholecystectomy   . Joint replacement     right hip  . Breast surgery     2 benign tumors removed left breast  . Foot surgery     benign tumors from foot  . Esophageal dilation     several times by Dr. Victorino Dike   HPI:  Pt 79 yo presenting for MBS and esophagram due to pt complaint of dysphagia.  PMH + for Afib, AMS, Parkinson's dx, dementia, encephalopathy, edema, HLD, DM, CAD.  Pt also with h/o EGDs for stricture last many years ago per daughter Larita Fife.  Pt med list includes Xanax, miralax, lasix, trazadone, neurontin, aricept, and protonix.       Assessment / Plan / Recommendation Clinical Impression  Dysphagia Diagnosis: Suspected primary esophageal dysphagia;Mild cervical esophageal phase dysphagia;Mild oral phase dysphagia  Clinical impression: Pt with mild oral and mild cervical esophageal phase dysphagia, suspect primary esophageal dysphagia.  No aspiration or laryngeal penetration of barium observed.  Minimal delay in oral transit d/t discoordination but pt has strong pharyngeal swallow without significant stasis.  Trace backflow of liquids from  prominent CP region to pharynx without sensation, cued dry swallows decreased residuals.  Pt appeared with stasis in esophagus (mid-region) without awareness-sensation.  Liquid appeared to aid clearance of pudding, but did not fully clear- radiologist not present during testing.    Please note, pt coughed and expectorated viscous secretions when cued to cough (to assess strength of cough) prior to starting mbs.  She reports frequently coughing up frothy white secretions at home.  A single event of cough DURING test with expectoration of thin barium and secretions noted.  Suspect pt's symptoms are esophageal based.     SLP educated pt and daughter to findings and recommendations.  Barium swallow to follow today.     Treatment Recommendation       Diet Recommendation Thin liquid (defer to primary SLP for determination after esophagram)   Medication Administration: Crushed with puree (follow with liquid) Supervision: Patient able to self feed Compensations: Small sips/bites;Slow rate;Follow solids with liquid Postural Changes and/or Swallow Maneuvers: Seated upright 90 degrees;Upright 30-60 min after meal    Other  Recommendations Recommended Consults: Consider esophageal assessment Oral Care Recommendations: Oral care before and after PO   Follow Up Recommendations  Skilled Nursing facility          SLP Swallow Goals  n/a   General HPI: Pt 79 yo presenting for MBS and esophagram due to pt complaint of dysphagia.  PMH + for Afib, AMS, Parkinson's dx, dementia, encephalopathy, edema, HLD, DM, CAD.  Pt also with h/o EGDs for stricture last many years ago per daughter Larita Fife.  Pt med list includes Xanax, miralax, lasix, trazadone, neurontin, aricept, and protonix.   Type of Study: Modified Barium Swallowing Study Reason for Referral: Objectively evaluate swallowing function Previous Swallow Assessment: clinical eval while pt admitted for N/V and dyspnea- suspected primary esophageal  dysphagia Diet Prior to this Study: Regular;Thin liquids Temperature Spikes Noted: No Respiratory Status: Room air History of Recent Intubation: No Behavior/Cognition: Alert;Cooperative;Pleasant mood;Hard of hearing;Requires cueing;Doesn't follow directions Oral Cavity - Dentition: Dentures, top;Dentures, bottom Oral Motor / Sensory Function: Within functional limits Self-Feeding Abilities: Able to feed self Patient Positioning: Upright in chair Baseline Vocal Quality: Clear Volitional Cough: Strong Volitional Swallow: Able to elicit Anatomy:  (? cervical osteophytes at C4-C5, C5-C6, C6-C7,? C7-T1)    Reason for Referral Objectively evaluate swallowing function   Oral Phase Oral Preparation/Oral Phase Oral Phase: Impaired Oral - Nectar Oral - Nectar Cup: Delayed oral transit Oral - Thin Oral - Thin Cup: Delayed oral transit Oral - Thin Straw: Delayed oral transit Oral - Solids Oral - Puree: Delayed oral transit Oral - Regular: Delayed oral transit;Impaired mastication Oral Phase - Comment Oral Phase - Comment: mild delay in oral transit but no significant oral stasis   Pharyngeal Phase Pharyngeal - Nectar Pharyngeal - Nectar Cup: Within functional limits Pharyngeal - Thin Pharyngeal - Thin Cup: Pharyngeal residue - cp segment (trace residuals) Pharyngeal - Thin Straw: Pharyngeal residue - cp segment (trace pyriform sinus, prominent cp- no sensation) Pharyngeal - Solids Pharyngeal - Puree: Within functional limits Pharyngeal - Regular: Within functional limits  Cervical Esophageal Phase    GO    Cervical Esophageal Phase Cervical Esophageal Phase: Impaired Cervical Esophageal Phase - Nectar Nectar Cup: Prominent cricopharyngeal segment Cervical Esophageal Phase - Thin Thin Cup: Esophageal backflow into the pharynx;Prominent cricopharyngeal segment Thin Straw: Prominent cricopharyngeal segment;Esophageal backflow into the pharynx Cervical Esophageal Phase -  Solids Puree: Prominent cricopharyngeal segment;Esophageal backflow into the pharynx Regular: Prominent cricopharyngeal segment Cervical Esophageal Phase - Comment Cervical Esophageal Comment: cued dry swallow facilitated clearance    Functional Assessment Tool Used: MBS, clinical judgement Functional Limitations: Swallowing Swallow Current Status ([id number]): At least 40 percent but less than 60 percent impaired, limited or restricted Swallow Goal Status [phone]): At least 40 percent but less than 60 percent impaired, limited or restricted Swallow Discharge Status [phone]): At least 40 percent but less than 60 percent impaired, limited or restricted    Donavan Burnet, MS [facility] SLP [phone]

## 2012-07-03 ENCOUNTER — Encounter: Payer: Self-pay | Admitting: Internal Medicine

## 2012-07-04 DIAGNOSIS — Z79899 Other long term (current) drug therapy: Secondary | ICD-10-CM | POA: Diagnosis not present

## 2012-07-04 DIAGNOSIS — E785 Hyperlipidemia, unspecified: Secondary | ICD-10-CM | POA: Diagnosis not present

## 2012-07-04 DIAGNOSIS — I498 Other specified cardiac arrhythmias: Secondary | ICD-10-CM | POA: Diagnosis not present

## 2012-07-04 DIAGNOSIS — I4891 Unspecified atrial fibrillation: Secondary | ICD-10-CM | POA: Diagnosis not present

## 2012-07-04 DIAGNOSIS — I10 Essential (primary) hypertension: Secondary | ICD-10-CM | POA: Diagnosis not present

## 2012-07-07 DIAGNOSIS — M7989 Other specified soft tissue disorders: Secondary | ICD-10-CM | POA: Diagnosis not present

## 2012-07-07 DIAGNOSIS — M79609 Pain in unspecified limb: Secondary | ICD-10-CM | POA: Diagnosis not present

## 2012-07-08 ENCOUNTER — Encounter: Payer: Self-pay | Admitting: *Deleted

## 2012-07-08 DIAGNOSIS — D649 Anemia, unspecified: Secondary | ICD-10-CM | POA: Diagnosis not present

## 2012-07-10 ENCOUNTER — Encounter: Payer: Self-pay | Admitting: Internal Medicine

## 2012-07-10 ENCOUNTER — Ambulatory Visit (INDEPENDENT_AMBULATORY_CARE_PROVIDER_SITE_OTHER): Payer: Medicare Other | Admitting: Internal Medicine

## 2012-07-10 ENCOUNTER — Telehealth: Payer: Self-pay | Admitting: *Deleted

## 2012-07-10 VITALS — BP 110/70 | HR 60

## 2012-07-10 DIAGNOSIS — R1310 Dysphagia, unspecified: Secondary | ICD-10-CM | POA: Diagnosis not present

## 2012-07-10 DIAGNOSIS — R1319 Other dysphagia: Secondary | ICD-10-CM | POA: Diagnosis not present

## 2012-07-10 NOTE — Patient Instructions (Addendum)
You have been scheduled for an endoscopy with dilation. Please follow written instructions given to you at your visit today. If you use inhalers (even only as needed) or a CPAP machine, please bring them with you on the day of your procedure.  You will be contacted by our office prior to your procedure for directions on holding your Plavix.  If you do not hear from our office 1 week prior to your scheduled procedure, please call [phone] to discuss.   We will contact Mr. Deatra Canter [phone] authorization to perform the endoscopy procedure. However, we have also gone over endoscopy instructions with Jeanine Luz, patient's caregiver.  CC: Dr Florentina Jenny, Dr Murray Hodgkins

## 2012-07-10 NOTE — Progress Notes (Signed)
Maria Christian 09-30-1924 MRN [id number]  History of Present Illness:  This is an 79 year old white female resident of a skilled nursing facility who has been having intermittent dysphagia, mostly to solids and pills for a year. She has dementia and she is wheelchair-bound. Her daughter comes with her. She has a history of coughing and choking and having pneumonia on at least 2 occasions. A barium esophagram in January 2014 was a limited study due to patient's immobility but showed no definite stricture. There was mild dysmotility and failure to propagate on several occasions. A barium tablet past without difficulty. Speech pathology evaluation on January 21 showed mild cervical and oral phase dysphagia. There was no aspiration. There was some liquid stasis in the mid esophagus. Her last hospitalization was in February 2013. Her diet currently consists of thin liquids, crushed pills and solid food. She has been on Plavix for thrombophlebitis but there is no history of pulmonary embolus or CVA. There is a past history of Parkinson's disease but she is not on any medications for it.   Past Medical History  Diagnosis Date  . Atrial fibrillation   . Altered mental status   . Encephalopathy   . Parkinson disease   . Hypertension   . GERD (gastroesophageal reflux disease)   . Hyperlipemia   . Diabetes mellitus   . Coronary artery disease   . History of adenomatous polyp of colon 06/24/99  . Enlarged heart   . DEMENTIA   . Edema of lower extremity 07/13/11    right leg more swollen than left leg  . Esophageal dysmotility 07/02/12  . Horseshoe kidney   . Pancreatic lesion 05/22/11    no further workup  per PCP/family due to age  . Anemia   . Peripheral neuropathy   . Depression   . Thrombophlebitis    Past Surgical History  Procedure Date  . Shoulder surgery 2001    left clavicle excision and acromioplasty  . Abdominal hysterectomy   . Cholecystectomy   . Total hip arthroplasty   . Breast  surgery     2 benign tumors removed left breast  . Foot surgery     benign tumors from foot  . Esophageal dilation     several times by Dr. Victorino Dike  . Nose surgery     reports that she has never smoked. She has never used smokeless tobacco. She reports that she does not drink alcohol or use illicit drugs. family history includes Cancer in an unspecified family member and Heart disease in an unspecified family member. No Known Allergies      Review of Systems: As in history of present illness  The remainder of the 10 point ROS is negative except as outlined in H&P   Physical Exam: General appearance  Well developed, in no distress. Hard of hearing. We did she'll vomit Eyes- non icteric. HEENT nontraumatic, normocephalic. Mouth no lesions, tongue papillated, no cheilosis. Neck supple without adenopathy, thyroid not enlarged, no carotid bruits, no JVD. Lungs Clear to auscultation bilaterally. Cor normal S1, normal S2, regular rhythm, no murmur,  quiet precordium. Abdomen: Soft nontender nondistended with normoactive bowel sounds. Rectal: Not done. Extremities no pedal edema. Skin no lesions. Neurological alert and oriented x 3. Psychological normal mood and affect.  Assessment and Plan:  Problem #1 Multifactorial dysphagia in an 79 year old patient who has dementia and Parkinson's disease. A speech pathology evaluation and modified barium swallow showed that she has oral pharyngeal dysphagia as well as mild  esophageal dysmotility, and possible spastic components to the dysphagia. Although esophageal dilation usually is not effective in this situation, patient's family is interested in endoscopic evaluation and trial of dilation. She is at increased risk for sedation because of her fragile state. The Plavix would have to be discontinued for 5 days prior to the procedure. She needs to follow recommendations of speech pathology as far as the consistency of food and crushing of her  tablets. She needs to follow antireflux measures which include head of the bed elevation and continuing Protonix 40 mg daily. We will obtain permission from her son who has the power of attorney. We will also check with Dr. Chilton Si as to allowing [name] to stop the Plavix.   07/10/2012 Lina Sar

## 2012-07-10 NOTE — Telephone Encounter (Signed)
I have spoken to Mr.Emerson, patient's power of attorney and have explained that patient needs an endoscopy with esophageal dilation for difficulty swallowing. I have advised him of prep instructions as well as time and date of procedure for patient (8:30 am 08/01/12 @ [facility] endoscopy). I have also advised him that patient will need to hold glucophage morning of test and we will get back with him regarding holding Plavix for procedure once we have heard back from Dr Chilton Si. I have also explained that his sister, Jeanine Luz would be bringing patient for her procedure and that we would need his written permission for procedure as well as for Ms Leonette Monarch to be appointed as her caregiver for the procedure. He verbalizes understanding. I have advised that I will send a written copy of instructions to him as well as consent for caregiver. He will sign and return to me.

## 2012-07-12 DIAGNOSIS — I519 Heart disease, unspecified: Secondary | ICD-10-CM | POA: Diagnosis not present

## 2012-07-12 DIAGNOSIS — D649 Anemia, unspecified: Secondary | ICD-10-CM | POA: Diagnosis not present

## 2012-07-12 DIAGNOSIS — I4891 Unspecified atrial fibrillation: Secondary | ICD-10-CM | POA: Diagnosis not present

## 2012-07-12 DIAGNOSIS — D539 Nutritional anemia, unspecified: Secondary | ICD-10-CM | POA: Diagnosis not present

## 2012-07-22 ENCOUNTER — Encounter: Payer: Self-pay | Admitting: *Deleted

## 2012-07-23 ENCOUNTER — Telehealth: Payer: Self-pay | Admitting: *Deleted

## 2012-07-23 NOTE — Telephone Encounter (Signed)
I have a spoken to [place] and [place] @ [facility]. Dr Chilton Si has already written order for patient to discontinue Plavix 5 days prior to her procedure. The order has been faxed to our office for our records. I have left a message for Lupita Leash, patient's caregiver (phone [phone]) to call back so I may let her know about Plavix instruction as well. I have also left a message for Mr Deatra Canter (patient's POA) to call back. I will instruct him on Plavix and also need to inquire about signed instructions as we have not received them yet.

## 2012-07-25 DIAGNOSIS — G609 Hereditary and idiopathic neuropathy, unspecified: Secondary | ICD-10-CM | POA: Diagnosis not present

## 2012-07-25 DIAGNOSIS — E785 Hyperlipidemia, unspecified: Secondary | ICD-10-CM | POA: Diagnosis not present

## 2012-07-25 DIAGNOSIS — I4891 Unspecified atrial fibrillation: Secondary | ICD-10-CM | POA: Diagnosis not present

## 2012-07-25 DIAGNOSIS — I10 Essential (primary) hypertension: Secondary | ICD-10-CM | POA: Diagnosis not present

## 2012-07-25 NOTE — Telephone Encounter (Signed)
I have spoken to patient's son, Mr Deatra Canter [facility]) to advise that Dr Chilton Si has given an okay to hold Plavix 5 days prior to patient's procedure. [facility] has that order. I have also asked about the prep instructions which were to be signed by Mr Deatra Canter. He states that his wife faxed the information back to our office this week. Unfortunately, we have not seen those. He states that he will have info faxed over again to our office. I will let him know as soon as I receive that information.

## 2012-07-26 NOTE — Telephone Encounter (Signed)
We received documentation today with signatures for procedure from Ms. Deatra Canter, [place] for patient. These forms will be scanned into EPIC.

## 2012-07-31 NOTE — Interval H&P Note (Signed)
History and Physical Interval Note:  07/31/2012 9:19 PM  Maria Christian  has presented today for surgery, with the diagnosis of dysphagia  The various methods of treatment have been discussed with the patient and family. After consideration of risks, benefits and other options for treatment, the patient has consented to  Procedure(s) with comments: ESOPHAGOGASTRODUODENOSCOPY (EGD) WITH ESOPHAGEAL DILATION (N/A) - with c-arm savory dilators as a surgical intervention .  The patient's history has been reviewed, patient examined, no change in status, stable for surgery.  I have reviewed the patient's chart and labs.  Questions were answered to the patient's satisfaction.     Lina Sar

## 2012-07-31 NOTE — H&P (View-Only) (Signed)
Maria Christian 07/29/1924 MRN [id number]  History of Present Illness:  This is an 79-year-old white female resident of a skilled nursing facility who has been having intermittent dysphagia, mostly to solids and pills for a year. She has dementia and she is wheelchair-bound. Her daughter comes with her. She has a history of coughing and choking and having pneumonia on at least 2 occasions. A barium esophagram in January 2014 was a limited study due to patient's immobility but showed no definite stricture. There was mild dysmotility and failure to propagate on several occasions. A barium tablet past without difficulty. Speech pathology evaluation on January 21 showed mild cervical and oral phase dysphagia. There was no aspiration. There was some liquid stasis in the mid esophagus. Her last hospitalization was in February 2013. Her diet currently consists of thin liquids, crushed pills and solid food. She has been on Plavix for thrombophlebitis but there is no history of pulmonary embolus or CVA. There is a past history of Parkinson's disease but she is not on any medications for it.   Past Medical History  Diagnosis Date  . Atrial fibrillation   . Altered mental status   . Encephalopathy   . Parkinson disease   . Hypertension   . GERD (gastroesophageal reflux disease)   . Hyperlipemia   . Diabetes mellitus   . Coronary artery disease   . History of adenomatous polyp of colon 06/24/99  . Enlarged heart   . DEMENTIA   . Edema of lower extremity 07/13/11    right leg more swollen than left leg  . Esophageal dysmotility 07/02/12  . Horseshoe kidney   . Pancreatic lesion 05/22/11    no further workup  per PCP/family due to age  . Anemia   . Peripheral neuropathy   . Depression   . Thrombophlebitis    Past Surgical History  Procedure Date  . Shoulder surgery 2001    left clavicle excision and acromioplasty  . Abdominal hysterectomy   . Cholecystectomy   . Total hip arthroplasty   . Breast  surgery     2 benign tumors removed left breast  . Foot surgery     benign tumors from foot  . Esophageal dilation     several times by Dr. Sam [place]  . Nose surgery     reports that she has never smoked. She has never used smokeless tobacco. She reports that she does not drink alcohol or use illicit drugs. family history includes Cancer in an unspecified family member and Heart disease in an unspecified family member. No Known Allergies      Review of Systems: As in history of present illness  The remainder of the 10 point ROS is negative except as outlined in H&P   Physical Exam: General appearance  Well developed, in no distress. Hard of hearing. We did she'll vomit Eyes- non icteric. HEENT nontraumatic, normocephalic. Mouth no lesions, tongue papillated, no cheilosis. Neck supple without adenopathy, thyroid not enlarged, no carotid bruits, no JVD. Lungs Clear to auscultation bilaterally. Cor normal S1, normal S2, regular rhythm, no murmur,  quiet precordium. Abdomen: Soft nontender nondistended with normoactive bowel sounds. Rectal: Not done. Extremities no pedal edema. Skin no lesions. Neurological alert and oriented x 3. Psychological normal mood and affect.  Assessment and Plan:  Problem #1 Multifactorial dysphagia in an 79-year-old patient who has dementia and Parkinson's disease. A speech pathology evaluation and modified barium swallow showed that she has oral pharyngeal dysphagia as well as mild   esophageal dysmotility, and possible spastic components to the dysphagia. Although esophageal dilation usually is not effective in this situation, patient's family is interested in endoscopic evaluation and trial of dilation. She is at increased risk for sedation because of her fragile state. The Plavix would have to be discontinued for 5 days prior to the procedure. She needs to follow recommendations of speech pathology as far as the consistency of food and crushing of her  tablets. She needs to follow antireflux measures which include head of the bed elevation and continuing Protonix 40 mg daily. We will obtain permission from her son who has the power of attorney. We will also check with Dr. Green as to allowing us to stop the Plavix.   07/10/2012 Maria Christian  

## 2012-08-01 ENCOUNTER — Ambulatory Visit (HOSPITAL_COMMUNITY): Payer: Medicare Other

## 2012-08-01 ENCOUNTER — Encounter (HOSPITAL_COMMUNITY): Payer: Self-pay

## 2012-08-01 ENCOUNTER — Encounter (HOSPITAL_COMMUNITY)
Admission: RE | Disposition: A | Discharge status: Skilled Nursing Facility | Payer: Self-pay | Source: Ambulatory Visit | Attending: Internal Medicine

## 2012-08-01 ENCOUNTER — Ambulatory Visit (HOSPITAL_COMMUNITY)
Admission: RE | Admit: 2012-08-01 | Discharge: 2012-08-01 | Disposition: A | Discharge status: Skilled Nursing Facility | Payer: Medicare Other | Source: Ambulatory Visit | Attending: Internal Medicine | Admitting: Internal Medicine

## 2012-08-01 DIAGNOSIS — I10 Essential (primary) hypertension: Secondary | ICD-10-CM | POA: Diagnosis not present

## 2012-08-01 DIAGNOSIS — K219 Gastro-esophageal reflux disease without esophagitis: Secondary | ICD-10-CM | POA: Insufficient documentation

## 2012-08-01 DIAGNOSIS — F0280 Dementia in other diseases classified elsewhere without behavioral disturbance: Secondary | ICD-10-CM | POA: Diagnosis not present

## 2012-08-01 DIAGNOSIS — K224 Dyskinesia of esophagus: Secondary | ICD-10-CM | POA: Insufficient documentation

## 2012-08-01 DIAGNOSIS — Z7902 Long term (current) use of antithrombotics/antiplatelets: Secondary | ICD-10-CM | POA: Insufficient documentation

## 2012-08-01 DIAGNOSIS — E119 Type 2 diabetes mellitus without complications: Secondary | ICD-10-CM | POA: Insufficient documentation

## 2012-08-01 DIAGNOSIS — E785 Hyperlipidemia, unspecified: Secondary | ICD-10-CM | POA: Insufficient documentation

## 2012-08-01 DIAGNOSIS — K2289 Other specified disease of esophagus: Secondary | ICD-10-CM | POA: Diagnosis not present

## 2012-08-01 DIAGNOSIS — I4891 Unspecified atrial fibrillation: Secondary | ICD-10-CM | POA: Diagnosis not present

## 2012-08-01 DIAGNOSIS — R1310 Dysphagia, unspecified: Secondary | ICD-10-CM | POA: Diagnosis not present

## 2012-08-01 DIAGNOSIS — K228 Other specified diseases of esophagus: Secondary | ICD-10-CM | POA: Insufficient documentation

## 2012-08-01 HISTORY — PX: ESOPHAGOGASTRODUODENOSCOPY (EGD) WITH ESOPHAGEAL DILATION: SHX5812

## 2012-08-01 SURGERY — ESOPHAGOGASTRODUODENOSCOPY (EGD) WITH ESOPHAGEAL DILATION
Anesthesia: Moderate Sedation

## 2012-08-01 MED ORDER — MIDAZOLAM HCL 10 MG/2ML IJ SOLN
INTRAMUSCULAR | Status: DC | PRN
  Administered 2012-08-01 (×2): 1 mg via INTRAVENOUS

## 2012-08-01 MED ORDER — MIDAZOLAM HCL 10 MG/2ML IJ SOLN
INTRAMUSCULAR | Status: AC
  Filled 2012-08-01: qty 2

## 2012-08-01 MED ORDER — METOPROLOL TARTRATE 25 MG PO TABS: 25.0000 mg | ORAL_TABLET | Freq: Two times a day (BID) | ORAL | Status: DC

## 2012-08-01 MED ORDER — BUTAMBEN-TETRACAINE-BENZOCAINE 2-2-14 % EX AERO
INHALATION_SPRAY | CUTANEOUS | Status: DC | PRN
  Administered 2012-08-01: 1 via TOPICAL

## 2012-08-01 MED ORDER — FENTANYL CITRATE 0.05 MG/ML IJ SOLN
INTRAMUSCULAR | Status: DC | PRN
  Administered 2012-08-01: 25 ug via INTRAVENOUS

## 2012-08-01 MED ORDER — FENTANYL CITRATE 0.05 MG/ML IJ SOLN
INTRAMUSCULAR | Status: AC
  Filled 2012-08-01: qty 2

## 2012-08-01 MED ORDER — SODIUM CHLORIDE 0.9 % IV SOLN
INTRAVENOUS | Status: DC
  Administered 2012-08-01: 10:00:00 via INTRAVENOUS

## 2012-08-01 NOTE — Op Note (Signed)
[facility] [address] [place], [address]   ENDOSCOPY PROCEDURE REPORT  PATIENT: Maria Christian, Maria Christian  MR#: [id number] BIRTHDATE: 1924-09-12 , 87  yrs. old GENDER: Female ENDOSCOPIST: Hart Carwin, MD REFERRED BY:  Murray Hodgkins, M.D. PROCEDURE DATE:  08/01/2012 PROCEDURE:  EGD, diagnostic and Savary dilation of esophagus ASA CLASS:     Class III INDICATIONS:  Dysphagia.   hx of es.dilations for dysphagia,resulted in benefit, known es. dismotility as per barium esophagram, ,dysphagia to liquids and solids. MEDICATIONS: These medications were titrated to patient response per physician's verbal order, Fentanyl 25 mcg IV, and Versed 2 mg IV TOPICAL ANESTHETIC: Cetacaine Spray  DESCRIPTION OF PROCEDURE: After the risks benefits and alternatives of the procedure were thoroughly explained, informed consent was obtained.  The Pentax Gastroscope [id number] endoscope was introduced through the mouth and advanced to the second portion of the duodenum. Without limitations.  The instrument was slowly withdrawn as the mucosa was fully examined.      Esophagus: normal esophageal lumen, no retained food,no stricture, LES relaxes appropriately, there is an intermittent spasm distally,and the lumen was eccentric Guide wire placed under fluoroscopy and large Savary dilators passes 17,18,60mm under fluoro guidance, small amount of blood on the last dilator STOMACH: normal mucosa, pyloric outlet normal DUODENUM: normal bulb and descending duodenum[         The scope was then withdrawn from the patient and the procedure completed.  COMPLICATIONS: There were no complications. ENDOSCOPIC IMPRESSION : Presbyesophagus with intermittent spasm passage of Savary dilators 17,18,19 mm under fluoro RECOMMENDATIONS:  post dilation instructions resume dysphagia diet strict antireflux measures continue acid supressing agent  REPEAT EXAM: no  eSigned:  Hart Carwin, MD 08/01/2012  12:31 PM   CC:

## 2012-08-01 NOTE — Interval H&P Note (Signed)
History and Physical Interval Note:  08/01/2012 11:18 AM  Maria Christian  has presented today for surgery, with the diagnosis of dysphagia  The various methods of treatment have been discussed with the patient and family. After consideration of risks, benefits and other options for treatment, the patient has consented to  Procedure(s) with comments: ESOPHAGOGASTRODUODENOSCOPY (EGD) WITH ESOPHAGEAL DILATION (N/A) - with c-arm savory dilators as a surgical intervention .  The patient's history has been reviewed, patient examined, no change in status, stable for surgery.  I have reviewed the patient's chart and labs.  Questions were answered to the patient's satisfaction.     Lina Sar

## 2012-08-02 ENCOUNTER — Encounter (HOSPITAL_COMMUNITY): Payer: Self-pay | Admitting: Internal Medicine

## 2012-08-08 DIAGNOSIS — R1314 Dysphagia, pharyngoesophageal phase: Secondary | ICD-10-CM | POA: Diagnosis not present

## 2012-08-08 DIAGNOSIS — R1312 Dysphagia, oropharyngeal phase: Secondary | ICD-10-CM | POA: Diagnosis not present

## 2012-08-08 DIAGNOSIS — F0390 Unspecified dementia without behavioral disturbance: Secondary | ICD-10-CM | POA: Diagnosis not present

## 2012-08-09 DIAGNOSIS — R1312 Dysphagia, oropharyngeal phase: Secondary | ICD-10-CM | POA: Diagnosis not present

## 2012-08-09 DIAGNOSIS — R1314 Dysphagia, pharyngoesophageal phase: Secondary | ICD-10-CM | POA: Diagnosis not present

## 2012-08-09 DIAGNOSIS — F0390 Unspecified dementia without behavioral disturbance: Secondary | ICD-10-CM | POA: Diagnosis not present

## 2012-08-11 DIAGNOSIS — R1312 Dysphagia, oropharyngeal phase: Secondary | ICD-10-CM | POA: Diagnosis not present

## 2012-08-11 DIAGNOSIS — F0390 Unspecified dementia without behavioral disturbance: Secondary | ICD-10-CM | POA: Diagnosis not present

## 2012-08-11 DIAGNOSIS — R1314 Dysphagia, pharyngoesophageal phase: Secondary | ICD-10-CM | POA: Diagnosis not present

## 2012-08-12 DIAGNOSIS — R1312 Dysphagia, oropharyngeal phase: Secondary | ICD-10-CM | POA: Diagnosis not present

## 2012-08-12 DIAGNOSIS — R1314 Dysphagia, pharyngoesophageal phase: Secondary | ICD-10-CM | POA: Diagnosis not present

## 2012-08-12 DIAGNOSIS — F0390 Unspecified dementia without behavioral disturbance: Secondary | ICD-10-CM | POA: Diagnosis not present

## 2012-08-13 DIAGNOSIS — F0280 Dementia in other diseases classified elsewhere without behavioral disturbance: Secondary | ICD-10-CM | POA: Diagnosis not present

## 2012-08-13 DIAGNOSIS — G309 Alzheimer's disease, unspecified: Secondary | ICD-10-CM | POA: Diagnosis not present

## 2012-08-13 DIAGNOSIS — R413 Other amnesia: Secondary | ICD-10-CM | POA: Diagnosis not present

## 2012-08-13 DIAGNOSIS — R630 Anorexia: Secondary | ICD-10-CM | POA: Diagnosis not present

## 2012-08-13 DIAGNOSIS — R51 Headache: Secondary | ICD-10-CM | POA: Diagnosis not present

## 2012-08-14 DIAGNOSIS — F0390 Unspecified dementia without behavioral disturbance: Secondary | ICD-10-CM | POA: Diagnosis not present

## 2012-08-14 DIAGNOSIS — R1314 Dysphagia, pharyngoesophageal phase: Secondary | ICD-10-CM | POA: Diagnosis not present

## 2012-08-14 DIAGNOSIS — R1312 Dysphagia, oropharyngeal phase: Secondary | ICD-10-CM | POA: Diagnosis not present

## 2012-08-17 DIAGNOSIS — R1312 Dysphagia, oropharyngeal phase: Secondary | ICD-10-CM | POA: Diagnosis not present

## 2012-08-17 DIAGNOSIS — R1314 Dysphagia, pharyngoesophageal phase: Secondary | ICD-10-CM | POA: Diagnosis not present

## 2012-08-17 DIAGNOSIS — F0390 Unspecified dementia without behavioral disturbance: Secondary | ICD-10-CM | POA: Diagnosis not present

## 2012-08-19 DIAGNOSIS — R1312 Dysphagia, oropharyngeal phase: Secondary | ICD-10-CM | POA: Diagnosis not present

## 2012-08-19 DIAGNOSIS — R1314 Dysphagia, pharyngoesophageal phase: Secondary | ICD-10-CM | POA: Diagnosis not present

## 2012-08-19 DIAGNOSIS — F0390 Unspecified dementia without behavioral disturbance: Secondary | ICD-10-CM | POA: Diagnosis not present

## 2012-08-20 DIAGNOSIS — R1312 Dysphagia, oropharyngeal phase: Secondary | ICD-10-CM | POA: Diagnosis not present

## 2012-08-20 DIAGNOSIS — F0390 Unspecified dementia without behavioral disturbance: Secondary | ICD-10-CM | POA: Diagnosis not present

## 2012-08-20 DIAGNOSIS — R1314 Dysphagia, pharyngoesophageal phase: Secondary | ICD-10-CM | POA: Diagnosis not present

## 2012-08-21 DIAGNOSIS — F0390 Unspecified dementia without behavioral disturbance: Secondary | ICD-10-CM | POA: Diagnosis not present

## 2012-08-21 DIAGNOSIS — R1312 Dysphagia, oropharyngeal phase: Secondary | ICD-10-CM | POA: Diagnosis not present

## 2012-08-21 DIAGNOSIS — L03039 Cellulitis of unspecified toe: Secondary | ICD-10-CM | POA: Diagnosis not present

## 2012-08-21 DIAGNOSIS — R1314 Dysphagia, pharyngoesophageal phase: Secondary | ICD-10-CM | POA: Diagnosis not present

## 2012-08-22 DIAGNOSIS — F0390 Unspecified dementia without behavioral disturbance: Secondary | ICD-10-CM | POA: Diagnosis not present

## 2012-08-22 DIAGNOSIS — R1312 Dysphagia, oropharyngeal phase: Secondary | ICD-10-CM | POA: Diagnosis not present

## 2012-08-22 DIAGNOSIS — R1314 Dysphagia, pharyngoesophageal phase: Secondary | ICD-10-CM | POA: Diagnosis not present

## 2012-08-23 DIAGNOSIS — F0390 Unspecified dementia without behavioral disturbance: Secondary | ICD-10-CM | POA: Diagnosis not present

## 2012-08-23 DIAGNOSIS — R1312 Dysphagia, oropharyngeal phase: Secondary | ICD-10-CM | POA: Diagnosis not present

## 2012-08-23 DIAGNOSIS — R1314 Dysphagia, pharyngoesophageal phase: Secondary | ICD-10-CM | POA: Diagnosis not present

## 2012-08-26 DIAGNOSIS — R1312 Dysphagia, oropharyngeal phase: Secondary | ICD-10-CM | POA: Diagnosis not present

## 2012-08-26 DIAGNOSIS — R1314 Dysphagia, pharyngoesophageal phase: Secondary | ICD-10-CM | POA: Diagnosis not present

## 2012-08-26 DIAGNOSIS — F0390 Unspecified dementia without behavioral disturbance: Secondary | ICD-10-CM | POA: Diagnosis not present

## 2012-08-27 DIAGNOSIS — R1314 Dysphagia, pharyngoesophageal phase: Secondary | ICD-10-CM | POA: Diagnosis not present

## 2012-08-27 DIAGNOSIS — R1312 Dysphagia, oropharyngeal phase: Secondary | ICD-10-CM | POA: Diagnosis not present

## 2012-08-27 DIAGNOSIS — F0390 Unspecified dementia without behavioral disturbance: Secondary | ICD-10-CM | POA: Diagnosis not present

## 2012-08-28 DIAGNOSIS — R1314 Dysphagia, pharyngoesophageal phase: Secondary | ICD-10-CM | POA: Diagnosis not present

## 2012-08-28 DIAGNOSIS — R1312 Dysphagia, oropharyngeal phase: Secondary | ICD-10-CM | POA: Diagnosis not present

## 2012-08-28 DIAGNOSIS — F0390 Unspecified dementia without behavioral disturbance: Secondary | ICD-10-CM | POA: Diagnosis not present

## 2012-09-03 ENCOUNTER — Non-Acute Institutional Stay (SKILLED_NURSING_FACILITY): Payer: Medicare Other | Admitting: Internal Medicine

## 2012-09-03 DIAGNOSIS — I4891 Unspecified atrial fibrillation: Secondary | ICD-10-CM

## 2012-09-03 DIAGNOSIS — I10 Essential (primary) hypertension: Secondary | ICD-10-CM

## 2012-09-03 DIAGNOSIS — E7800 Pure hypercholesterolemia, unspecified: Secondary | ICD-10-CM | POA: Diagnosis not present

## 2012-09-03 DIAGNOSIS — E1149 Type 2 diabetes mellitus with other diabetic neurological complication: Secondary | ICD-10-CM | POA: Diagnosis not present

## 2012-09-04 DIAGNOSIS — I519 Heart disease, unspecified: Secondary | ICD-10-CM | POA: Diagnosis not present

## 2012-09-04 DIAGNOSIS — E119 Type 2 diabetes mellitus without complications: Secondary | ICD-10-CM | POA: Diagnosis not present

## 2012-09-04 NOTE — Progress Notes (Signed)
PROGRESS NOTE  DATE: 09/03/12  FACILITY: [facility]  LEVEL OF CARE: SNF  Routine Visit  CHIEF COMPLAINT:  Manage atrial fibrillation, hypertension and diabetes mellitus  HISTORY OF PRESENT ILLNESS:  REASSESSMENT OF ONGOING PROBLEM(S):  1. ATRIAL FIBRILLATION: the patients a-fib remains stable.  The patient denies DOE, tachycardia, orthopnea, transient neurological sx, palpitations, & PNDs.  No complications noted from the medications currently being used.  2.HTN: Pt 's HTN remains stable.  Denies CP, sob, DOE, pedal edema, headaches, dizziness or visual disturbances.  No complications from the medications currently being used.  Last BP : 131/82, 157/76, 145/82, 151/70.  3. DM:pt's DM remains stable.  Pt denies polyuria, polydipsia, polyphagia, changes in vision or hypoglycemic episodes.  No complications noted from the medication presently being used.  Last hemoglobin A1c is: 7.1 in 8/13.  PAST MEDICAL HISTORY : Reviewed.  No changes.  CURRENT MEDICATIONS: Reviewed per [facility]  REVIEW OF SYSTEMS:  GENERAL: no change in appetite, no fatigue, no weight changes, no fever, chills or weakness RESPIRATORY: no cough, SOB, DOE,, wheezing, hemoptysis CARDIAC: no chest pain, or palpitations, lower extremity swelling present GI: no abdominal pain, diarrhea, constipation, heart burn, nausea or vomiting  PHYSICAL EXAMINATION  VS:  T 97.7       P 87      RR 22      BP 131/82     POX %     WT (Lb)  GENERAL: no acute distress, normal body habitus EYES: conjunctivae normal, sclerae normal, normal eye lids NECK: supple, trachea midline, no neck masses, no thyroid tenderness, no thyromegaly LYMPHATICS: no LAN in the neck, no supraclavicular LAN RESPIRATORY: breathing is even & unlabored, BS CTAB CARDIAC: RRR, no murmur,no extra heart sounds, right lower extremity has +2 edema in the left lower extremity has +1 edema GI: abdomen soft, normal BS, no masses, no tenderness, no hepatomegaly, no  splenomegaly PSYCHIATRIC: the patient is alert & oriented to person, affect & behavior appropriate  LABS/RADIOLOGY:  3/14 EGD showed presbyesophagus with intermittent spasm  2/14 glucose 105 otherwise BMP normal, iron panel normal, hemoglobin 10.3, MCV 98.1 otherwise CBC normal, platelets 108, fasting lipid panel normal, vitamin B12 level 449, folate 16.1, ferritin 96  ASSESSMENT/PLAN:  1. diabetes mellitus-uncontrolled. Glucophage was increased. Recheck hemoglobin A1c. 2. hypertension-blood pressure normalizing after increasing lisinopril. 3. atrial fibrillation-rate controlled. 4. hyperlipidemia-well-controlled. 5. Constipation- well controlled. 6. peripheral neuropathy-denies symptoms. 7. depression-denies symptoms. 8. check liver profile.  CPT CODE: 16109

## 2012-09-17 DIAGNOSIS — E119 Type 2 diabetes mellitus without complications: Secondary | ICD-10-CM | POA: Diagnosis not present

## 2012-10-09 DIAGNOSIS — H4011X0 Primary open-angle glaucoma, stage unspecified: Secondary | ICD-10-CM | POA: Diagnosis not present

## 2012-10-09 DIAGNOSIS — Z961 Presence of intraocular lens: Secondary | ICD-10-CM | POA: Diagnosis not present

## 2012-10-09 DIAGNOSIS — H04129 Dry eye syndrome of unspecified lacrimal gland: Secondary | ICD-10-CM | POA: Diagnosis not present

## 2012-10-23 ENCOUNTER — Non-Acute Institutional Stay (SKILLED_NURSING_FACILITY): Payer: Medicare Other | Admitting: Internal Medicine

## 2012-10-23 DIAGNOSIS — I4891 Unspecified atrial fibrillation: Secondary | ICD-10-CM | POA: Diagnosis not present

## 2012-10-23 DIAGNOSIS — E119 Type 2 diabetes mellitus without complications: Secondary | ICD-10-CM | POA: Diagnosis not present

## 2012-10-23 DIAGNOSIS — E785 Hyperlipidemia, unspecified: Secondary | ICD-10-CM | POA: Diagnosis not present

## 2012-10-23 DIAGNOSIS — I10 Essential (primary) hypertension: Secondary | ICD-10-CM

## 2012-10-26 DIAGNOSIS — E119 Type 2 diabetes mellitus without complications: Secondary | ICD-10-CM | POA: Insufficient documentation

## 2012-10-26 NOTE — Progress Notes (Signed)
PROGRESS NOTE  DATE: 10/23/12  FACILITY: [facility]  LEVEL OF CARE: SNF  Routine Visit  CHIEF COMPLAINT:  Manage atrial fibrillation, hypertension and diabetes mellitus  HISTORY OF PRESENT ILLNESS:  REASSESSMENT OF ONGOING PROBLEM(S):  1. ATRIAL FIBRILLATION: the patients a-fib remains stable.  The patient denies DOE, tachycardia, orthopnea, transient neurological sx, palpitations, & PNDs.  No complications noted from the medications currently being used.  2.HTN: Pt 's HTN remains stable.  Denies CP, sob, DOE, pedal edema, headaches, dizziness or visual disturbances.  No complications from the medications currently being used.  Last BP :123/72, 131/82, 157/76, 145/82, 151/70.  3. DM:pt's DM remains stable.  Pt denies polyuria, polydipsia, polyphagia, changes in vision or hypoglycemic episodes.  No complications noted from the medication presently being used.  Last hemoglobin A1c is: 6.4 in 4/14, 7.1 in 8/13.  PAST MEDICAL HISTORY : Reviewed.  No changes.  CURRENT MEDICATIONS: Reviewed per [facility]  REVIEW OF SYSTEMS:  GENERAL: no change in appetite, no fatigue, no weight changes, no fever, chills or weakness RESPIRATORY: no cough, SOB, DOE,, wheezing, hemoptysis CARDIAC: no chest pain, or palpitations, lower extremity swelling present GI: no abdominal pain, diarrhea, constipation, heart burn, nausea or vomiting  PHYSICAL EXAMINATION  VS:  T 97.6       P 82      RR 20     BP 123/72     POX %     WT (Lb)  GENERAL: no acute distress, normal body habitus NECK: supple, trachea midline, no neck masses, no thyroid tenderness, no thyromegaly RESPIRATORY: breathing is even & unlabored, BS CTAB CARDIAC: RRR, no murmur,no extra heart sounds, right lower extremity has +2 edema in the left lower extremity has +1 edema GI: abdomen soft, normal BS, no masses, no tenderness, no hepatomegaly, no splenomegaly PSYCHIATRIC: the patient is alert & oriented to person, affect & behavior  appropriate  LABS/RADIOLOGY:  4/14 total protein 5.8 otherwise liver profile normal  3/14 EGD showed presbyesophagus with intermittent spasm  2/14 glucose 105 otherwise BMP normal, iron panel normal, hemoglobin 10.3, MCV 98.1 otherwise CBC normal, platelets 108, fasting lipid panel normal, vitamin B12 level 449, folate 16.1, ferritin 96  ASSESSMENT/PLAN:  1. diabetes mellitus-well controlled. 2. hypertension-well controlled. 3. atrial fibrillation-rate controlled. 4. hyperlipidemia-well-controlled. 5. Constipation- well controlled. 6. peripheral neuropathy-denies symptoms. 7. depression-denies symptoms.  CPT CODE: 40981

## 2012-11-14 DIAGNOSIS — Z1231 Encounter for screening mammogram for malignant neoplasm of breast: Secondary | ICD-10-CM | POA: Diagnosis not present

## 2012-11-29 ENCOUNTER — Telehealth: Payer: Self-pay | Admitting: Neurology

## 2012-11-29 NOTE — Telephone Encounter (Signed)
Patient appointment has been cancelled, i have tried to reach out to her to let her know no answer.

## 2012-12-04 ENCOUNTER — Ambulatory Visit: Payer: Self-pay | Admitting: Nurse Practitioner

## 2012-12-04 DIAGNOSIS — B351 Tinea unguium: Secondary | ICD-10-CM | POA: Diagnosis not present

## 2012-12-04 DIAGNOSIS — M79609 Pain in unspecified limb: Secondary | ICD-10-CM | POA: Diagnosis not present

## 2012-12-05 ENCOUNTER — Ambulatory Visit (INDEPENDENT_AMBULATORY_CARE_PROVIDER_SITE_OTHER): Payer: Medicare Other | Admitting: Neurology

## 2012-12-05 ENCOUNTER — Encounter: Payer: Self-pay | Admitting: Neurology

## 2012-12-05 VITALS — BP 131/67 | HR 62

## 2012-12-05 DIAGNOSIS — R51 Headache: Secondary | ICD-10-CM

## 2012-12-05 MED ORDER — DIVALPROEX SODIUM ER 500 MG PO TB24: 500.0000 mg | ORAL_TABLET | Freq: Every day | ORAL | Status: DC

## 2012-12-05 NOTE — Patient Instructions (Signed)
Increase Gabapentin to 300 mg at night for leg pain and restless legs. Start Depakote ER 500 mg daily for headaches. Return for followup in 3 months with Larita Fife, nurse practitioner

## 2012-12-08 NOTE — Progress Notes (Signed)
[facility] [address] [place]. [place] [address] [phone]       OFFICE FOLLOW-UP NOTE  Ms. JAMES LAFALCE Date of Birth:  1925/05/13 Medical Record Number:  [id number]   HPI: 65 year lady with recurrent headaches likely mixed vascular and tension headaches now worsened likely due to discontinuation of topamax.Mild  Cognitive impairment/mild dementia which is stable.Silent cerebrovascular disease on MRI. She returns for f/u today after last visit on 08/13/12.She actually did not have an appointment today but SNF where she stays had a mix up and sent her today hence I agreed to see her.She reports increase in headaches since stopping Topamax after last visit.Headaches are now allmost daily, they are mild to moderate mostly in left temples and occasionally right side as well. She also complains of leg pain and restlessness at night and has been started on gabapentin 100 mg at night with partial benefit.She states her apetite is better and she has been eating more since discontinuing Topamax.  ROS:   14 system review of systems is positive for fever,chills,leg swelling,hearing loss,ringing in ears,shortness of breath,runny nose, leg pain and restlessness  PMH:  Past Medical History  Diagnosis Date  . Atrial fibrillation   . Altered mental status   . Encephalopathy   . Parkinson disease   . Hypertension   . GERD (gastroesophageal reflux disease)   . Hyperlipemia   . Diabetes mellitus   . Coronary artery disease   . History of adenomatous polyp of colon 06/24/99  . Enlarged heart   . DEMENTIA   . Edema of lower extremity 07/13/11    right leg more swollen than left leg  . Esophageal dysmotility 07/02/12  . Horseshoe kidney   . Pancreatic lesion 05/22/11    no further workup  per PCP/family due to age  . Anemia   . Peripheral neuropathy   . Depression   . Thrombophlebitis     Social History:  History   Social History  . Marital Status: Married    Spouse  Name: N/A    Number of Children: 5  . Years of Education: 10th   Occupational History  . Retired    Social History Main Topics  . Smoking status: Never Smoker   . Smokeless tobacco: Never Used  . Alcohol Use: No  . Drug Use: No  . Sexually Active: Not on file   Other Topics Concern  . Not on file   Social History Narrative   Pt lives at [facility] and [place].   Caffeine Use: very small amount daily    Medications:   Current Outpatient Prescriptions on File Prior to Visit  Medication Sig Dispense Refill  . ALPRAZolam (XANAX) 0.25 MG tablet Take 0.25 mg by mouth 2 (two) times daily as needed.       Marland Kitchen aspirin 81 MG chewable tablet Chew 81 mg by mouth daily.      . Cholecalciferol ([employer] VITAMIN D3) 1000 UNITS capsule Take 1,000 Units by mouth daily.       . clopidogrel (PLAVIX) 75 MG tablet Take 75 mg by mouth daily.       . Cranberry 475 MG CAPS Take 1 capsule by mouth 2 (two) times daily.      Marland Kitchen dextromethorphan-guaiFENesin (MUCINEX DM) 30-600 MG per 12 hr tablet Take 1 tablet by mouth every 12 (twelve) hours.      Marland Kitchen donepezil (ARICEPT) 5 MG tablet Take 5 mg by mouth at bedtime.      Marland Kitchen  dorzolamide (TRUSOPT) 2 % ophthalmic solution Place 1 drop into both eyes 2 (two) times daily.       . furosemide (LASIX) 20 MG tablet Take 20 mg by mouth daily.      Marland Kitchen gabapentin (NEURONTIN) 100 MG capsule Take 100 mg by mouth at bedtime.       Marland Kitchen ketotifen (ZADITOR) 0.025 % ophthalmic solution Place 1 drop into both eyes 2 (two) times daily.      Marland Kitchen lisinopril (PRINIVIL,ZESTRIL) 10 MG tablet Take 40 mg by mouth daily.       . metFORMIN (GLUCOPHAGE) 500 MG tablet Take 500 mg by mouth 2 (two) times daily with a meal.       . Multiple Vitamins-Minerals (CERTAGEN PO) Take 1 tablet by mouth daily.       . pantoprazole (PROTONIX) 40 MG tablet Take 40 mg by mouth daily.      . polyethylene glycol (MIRALAX / GLYCOLAX) packet Take 17 g by mouth daily.      . potassium chloride SA (K-DUR,KLOR-CON)  20 MEQ tablet Take 20 mEq by mouth daily.       . pravastatin (PRAVACHOL) 20 MG tablet Take 20 mg by mouth at bedtime.       . sertraline (ZOLOFT) 25 MG tablet Take 25 mg by mouth daily.      . Travoprost, BAK Free, (TRAVATAN) 0.004 % SOLN ophthalmic solution Place 1 drop into both eyes at bedtime.      . traZODone (DESYREL) 50 MG tablet Take 100 mg by mouth at bedtime.       . vitamin B-12 (CYANOCOBALAMIN) 500 MCG tablet Take 500 mcg by mouth daily.       No current facility-administered medications on file prior to visit.    Allergies:  No Known Allergies Filed Vitals:   12/05/12 1541  BP: 131/67  Pulse: 62    Physical Exam General: well developed, well nourished, seated, in no evident distress Head: head normocephalic and atraumatic. Orohparynx benign Neck: supple with no carotid or supraclavicular bruits Cardiovascular: regular rate and rhythm, no murmurs Musculoskeletal: no deformity Skin:  no rash/petichiae Vascular:  Normal pulses all extremities  Neurologic Exam Mental Status: Awake and fully alert. Oriented to place and time. Recent and remote memory intact. Attention span, concentration and fund of knowledge appropriate. Mood and affect appropriate. MMSE 28/30 with deficits in orientation and repitition.Clock drawing 3/4 and animal naming test 9. Cranial Nerves: Fundoscopic exam reveals sharp disc margins. Pupils equal, briskly reactive to light. Extraocular movements full without nystagmus. Visual fields full to confrontation. Hearing diminished bilaterally.. Facial sensation intact. Face, tongue, palate moves normally and symmetrically.  Motor: Normal bulk and tone. Normal strength in all tested extremity muscles. Sensory.: intact to tough and pinprick and vibratory.  Coordination: Rapid alternating movements normal in all extremities. Finger-to-nose and heel-to-shin performed accurately bilaterally. Gait and Station: Arises from chair without difficulty. Stance is normal.  Gait demonstrates normal stride length and balance . Able to heel, toe and tandem walk without difficulty.  Reflexes: 1+ and symmetric. Toes downgoing.     ASSESSMENT:  89 year lady with recurrent headaches likely mixed vascular and tension headaches now worsened likely due to discontinuation of topamax.Mild  Cognitive impairment/mild dementia which is stable.Silent cerebrovascular disease on MRI.  Pain and restlessness at night likely RLS   PLAN: Start Depakote ER 500 mg daily for headache prophylaxis.Increase gabapentin 300 mg hs for RLS.Continue plavix for stroke prevention and strict control of hypertension with BP  goal below 130/90 and diabetes with HbA1c goal below 6.5%.F/U in 3 months with Heide Guile, NP

## 2012-12-12 ENCOUNTER — Encounter: Payer: Self-pay | Admitting: Internal Medicine

## 2012-12-19 ENCOUNTER — Non-Acute Institutional Stay (SKILLED_NURSING_FACILITY): Payer: Medicare Other | Admitting: Adult Health

## 2012-12-19 ENCOUNTER — Encounter: Payer: Self-pay | Admitting: Adult Health

## 2012-12-19 DIAGNOSIS — E119 Type 2 diabetes mellitus without complications: Secondary | ICD-10-CM

## 2012-12-19 DIAGNOSIS — E785 Hyperlipidemia, unspecified: Secondary | ICD-10-CM

## 2012-12-19 DIAGNOSIS — G4700 Insomnia, unspecified: Secondary | ICD-10-CM

## 2012-12-19 DIAGNOSIS — G459 Transient cerebral ischemic attack, unspecified: Secondary | ICD-10-CM

## 2012-12-19 DIAGNOSIS — I509 Heart failure, unspecified: Secondary | ICD-10-CM

## 2012-12-19 DIAGNOSIS — F0391 Unspecified dementia with behavioral disturbance: Secondary | ICD-10-CM | POA: Insufficient documentation

## 2012-12-19 DIAGNOSIS — K5900 Constipation, unspecified: Secondary | ICD-10-CM

## 2012-12-19 DIAGNOSIS — F329 Major depressive disorder, single episode, unspecified: Secondary | ICD-10-CM

## 2012-12-19 DIAGNOSIS — F0390 Unspecified dementia without behavioral disturbance: Secondary | ICD-10-CM

## 2012-12-19 DIAGNOSIS — K219 Gastro-esophageal reflux disease without esophagitis: Secondary | ICD-10-CM | POA: Diagnosis not present

## 2012-12-19 DIAGNOSIS — I2510 Atherosclerotic heart disease of native coronary artery without angina pectoris: Secondary | ICD-10-CM | POA: Diagnosis not present

## 2012-12-19 DIAGNOSIS — F3289 Other specified depressive episodes: Secondary | ICD-10-CM

## 2012-12-19 DIAGNOSIS — I5032 Chronic diastolic (congestive) heart failure: Secondary | ICD-10-CM

## 2012-12-19 DIAGNOSIS — I10 Essential (primary) hypertension: Secondary | ICD-10-CM

## 2012-12-19 HISTORY — DX: Transient cerebral ischemic attack, unspecified: G45.9

## 2012-12-19 NOTE — Progress Notes (Signed)
  Subjective:    Patient ID: Maria Christian, female    DOB: 1924-06-12, 79 y.o.   MRN: [id number]  HPI This is a long-term resident who is being seen for a routine. Patient has been stable for the past month.  Review of Systems  Constitutional: Negative.   HENT: Negative.   Eyes: Negative.   Respiratory: Negative for cough and shortness of breath.   Cardiovascular: Positive for leg swelling. Negative for chest pain.  Gastrointestinal: Negative.   Endocrine: Negative.   Genitourinary: Negative.   Neurological: Negative.   Hematological: Negative for adenopathy. Does not bruise/bleed easily.  Psychiatric/Behavioral: Negative.        Objective:   Physical Exam  Nursing note and vitals reviewed. Constitutional: She is oriented to person, place, and time. She appears well-developed and well-nourished.  HENT:  Head: Normocephalic and atraumatic.  Right Ear: External ear normal.  Left Ear: External ear normal.  Nose: Nose normal.  Mouth/Throat: Oropharynx is clear and moist.  Eyes: Conjunctivae and EOM are normal. Pupils are equal, round, and reactive to light.  Neck: Normal range of motion. Neck supple.  Cardiovascular: Normal rate, normal heart sounds and intact distal pulses.   Pulmonary/Chest: Effort normal and breath sounds normal.  Abdominal: Soft. Bowel sounds are normal.  Musculoskeletal: She exhibits edema. She exhibits no tenderness.  The lower extremity edema Right lower extremity edema, 2+ Left lower extremity edema, 1+  Neurological: She is alert and oriented to person, place, and time.  Skin: Skin is warm and dry.  Psychiatric: She has a normal mood and affect. Her behavior is normal. Judgment and thought content normal.     LABS/RADIOLOGY:  4/14 total protein 5.8 otherwise liver profile normal  3/14 EGD showed presbyesophagus with intermittent spasm  2/14 glucose 105 otherwise BMP normal, iron panel normal, hemoglobin 10.3, MCV 98.1 otherwise CBC normal,  platelets 108, fasting lipid panel normal, vitamin B12 level 449, folate 16.1, ferritin 96    Medications reviewed.    Assessment & Plan:   TIA (transient ischemic attack) -recently started on Depakote  Dementia - stable  Insomnia - stable  Depression - stable  Constipation - stable  Type II or unspecified type diabetes mellitus without mention of complication, not stated as uncontrolled - well controlled  Hypertension - well controlled  GERD (gastroesophageal reflux disease) - stable  Hyperlipemia - stable  Coronary artery disease - stable  Atrial fibrillation with controlled ventricular response - stable  Diastolic CHF, chronic - stable

## 2013-01-06 DIAGNOSIS — I10 Essential (primary) hypertension: Secondary | ICD-10-CM | POA: Diagnosis not present

## 2013-01-06 DIAGNOSIS — D539 Nutritional anemia, unspecified: Secondary | ICD-10-CM | POA: Diagnosis not present

## 2013-01-06 DIAGNOSIS — D649 Anemia, unspecified: Secondary | ICD-10-CM | POA: Diagnosis not present

## 2013-01-06 DIAGNOSIS — E785 Hyperlipidemia, unspecified: Secondary | ICD-10-CM | POA: Diagnosis not present

## 2013-01-06 DIAGNOSIS — E119 Type 2 diabetes mellitus without complications: Secondary | ICD-10-CM | POA: Diagnosis not present

## 2013-01-08 ENCOUNTER — Telehealth: Payer: Self-pay | Admitting: Neurology

## 2013-01-10 ENCOUNTER — Encounter: Payer: Self-pay | Admitting: Adult Health

## 2013-01-10 DIAGNOSIS — I10 Essential (primary) hypertension: Secondary | ICD-10-CM | POA: Diagnosis not present

## 2013-01-10 DIAGNOSIS — R1314 Dysphagia, pharyngoesophageal phase: Secondary | ICD-10-CM | POA: Diagnosis not present

## 2013-01-10 DIAGNOSIS — F0390 Unspecified dementia without behavioral disturbance: Secondary | ICD-10-CM | POA: Diagnosis not present

## 2013-01-10 DIAGNOSIS — R491 Aphonia: Secondary | ICD-10-CM | POA: Diagnosis not present

## 2013-01-10 DIAGNOSIS — R1312 Dysphagia, oropharyngeal phase: Secondary | ICD-10-CM | POA: Diagnosis not present

## 2013-01-10 NOTE — Telephone Encounter (Signed)
Called [facility] to get a better understanding as to why the patient son wants to discontinue Depakote. The patient son has look the medication up on the computer and found that its a siezure medication and does not want his mother on it. I let the nursing home as well as the daughter know Depakote ER can be used for migraine headaches also. Dr. Pearlean Brownie has D/Ced the medication on 01-09-2013 daughter and nursing home showed understanding.

## 2013-01-13 DIAGNOSIS — R1312 Dysphagia, oropharyngeal phase: Secondary | ICD-10-CM | POA: Diagnosis not present

## 2013-01-13 DIAGNOSIS — R1314 Dysphagia, pharyngoesophageal phase: Secondary | ICD-10-CM | POA: Diagnosis not present

## 2013-01-13 DIAGNOSIS — F0390 Unspecified dementia without behavioral disturbance: Secondary | ICD-10-CM | POA: Diagnosis not present

## 2013-01-13 DIAGNOSIS — R491 Aphonia: Secondary | ICD-10-CM | POA: Diagnosis not present

## 2013-01-14 DIAGNOSIS — R491 Aphonia: Secondary | ICD-10-CM | POA: Diagnosis not present

## 2013-01-14 DIAGNOSIS — F0390 Unspecified dementia without behavioral disturbance: Secondary | ICD-10-CM | POA: Diagnosis not present

## 2013-01-14 DIAGNOSIS — R1312 Dysphagia, oropharyngeal phase: Secondary | ICD-10-CM | POA: Diagnosis not present

## 2013-01-14 DIAGNOSIS — R1314 Dysphagia, pharyngoesophageal phase: Secondary | ICD-10-CM | POA: Diagnosis not present

## 2013-01-15 DIAGNOSIS — F0390 Unspecified dementia without behavioral disturbance: Secondary | ICD-10-CM | POA: Diagnosis not present

## 2013-01-15 DIAGNOSIS — R1314 Dysphagia, pharyngoesophageal phase: Secondary | ICD-10-CM | POA: Diagnosis not present

## 2013-01-15 DIAGNOSIS — R1312 Dysphagia, oropharyngeal phase: Secondary | ICD-10-CM | POA: Diagnosis not present

## 2013-01-15 DIAGNOSIS — R491 Aphonia: Secondary | ICD-10-CM | POA: Diagnosis not present

## 2013-01-16 DIAGNOSIS — F0390 Unspecified dementia without behavioral disturbance: Secondary | ICD-10-CM | POA: Diagnosis not present

## 2013-01-16 DIAGNOSIS — R491 Aphonia: Secondary | ICD-10-CM | POA: Diagnosis not present

## 2013-01-16 DIAGNOSIS — R1314 Dysphagia, pharyngoesophageal phase: Secondary | ICD-10-CM | POA: Diagnosis not present

## 2013-01-16 DIAGNOSIS — R1312 Dysphagia, oropharyngeal phase: Secondary | ICD-10-CM | POA: Diagnosis not present

## 2013-01-27 ENCOUNTER — Encounter: Payer: Self-pay | Admitting: Adult Health

## 2013-01-30 ENCOUNTER — Non-Acute Institutional Stay (SKILLED_NURSING_FACILITY): Payer: Medicare Other | Admitting: Internal Medicine

## 2013-01-30 DIAGNOSIS — I4891 Unspecified atrial fibrillation: Secondary | ICD-10-CM | POA: Diagnosis not present

## 2013-01-30 DIAGNOSIS — E785 Hyperlipidemia, unspecified: Secondary | ICD-10-CM | POA: Diagnosis not present

## 2013-01-30 DIAGNOSIS — I480 Paroxysmal atrial fibrillation: Secondary | ICD-10-CM | POA: Insufficient documentation

## 2013-01-30 DIAGNOSIS — I10 Essential (primary) hypertension: Secondary | ICD-10-CM

## 2013-01-30 DIAGNOSIS — E119 Type 2 diabetes mellitus without complications: Secondary | ICD-10-CM | POA: Diagnosis not present

## 2013-01-30 NOTE — Progress Notes (Signed)
PROGRESS NOTE  DATE: 01/30/13  FACILITY: [facility]  LEVEL OF CARE: SNF  Routine Visit  CHIEF COMPLAINT:  Manage atrial fibrillation, hypertension and diabetes mellitus  HISTORY OF PRESENT ILLNESS:  REASSESSMENT OF ONGOING PROBLEM(S):  ATRIAL FIBRILLATION: the patients a-fib remains stable.  The patient denies DOE, tachycardia, orthopnea, transient neurological sx, palpitations, & PNDs.  No complications noted from the medications currently being used.  HTN: Pt 's HTN remains stable.  Denies CP, sob, DOE, headaches, dizziness or visual disturbances.  No complications from the medications currently being used.  Last BP :123/72, 131/82, 157/76, 145/82, 151/70, 115/76.  DM:pt's DM remains stable.  Pt denies polyuria, polydipsia, polyphagia, changes in vision or hypoglycemic episodes.  No complications noted from the medication presently being used.  Last hemoglobin A1c is: 6.4 in 4/14, 7.1 in 8/13.  PAST MEDICAL HISTORY : Reviewed.  No changes.  CURRENT MEDICATIONS: Reviewed per [facility]  REVIEW OF SYSTEMS:  GENERAL: no change in appetite, no fatigue, no weight changes, no fever, chills or weakness RESPIRATORY: no cough, SOB, DOE,, wheezing, hemoptysis CARDIAC: no chest pain, or palpitations, lower extremity swelling present GI: no abdominal pain, diarrhea, constipation, heart burn, nausea or vomiting  PHYSICAL EXAMINATION  VS:  T 98.6       P 62      RR 18     BP 115/76     POX % 98     WT (Lb)  GENERAL: no acute distress, normal body habitus NECK: supple, trachea midline, no neck masses, no thyroid tenderness, no thyromegaly RESPIRATORY: breathing is even & unlabored, BS CTAB CARDIAC: RRR, no murmur,no extra heart sounds, right lower extremity has +2 edema in the left lower extremity has +1 edema GI: abdomen soft, normal BS, no masses, no tenderness, no hepatomegaly, no splenomegaly PSYCHIATRIC: the patient is alert & oriented to person, affect & behavior  appropriate  LABS/RADIOLOGY:  8-14 CO2 32 otherwise BMP normal, WBC 3.4, hemoglobin 9.4, MCV 106.4, platelets 94, HDL 30 otherwise fasting lipid panel normal, folate acid level 21.830, ferritin lever 83, vitamin B12 level 378  4/14 total protein 5.8 otherwise liver profile normal  3/14 EGD showed presbyesophagus with intermittent spasm  2/14 glucose 105 otherwise BMP normal, iron panel normal, hemoglobin 10.3, MCV 98.1 otherwise CBC normal, platelets 108, fasting lipid panel normal, vitamin B12 level 449, folate 16.1, ferritin 96  ASSESSMENT/PLAN:  diabetes mellitus-well controlled. hypertension-well controlled. atrial fibrillation-rate controlled. hyperlipidemia-well-controlled. Constipation- well controlled. peripheral neuropathy-denies symptoms. Neurontin was increased depression-denies symptoms. Check Depakote level  CPT CODE: 78295

## 2013-01-31 DIAGNOSIS — G40802 Other epilepsy, not intractable, without status epilepticus: Secondary | ICD-10-CM | POA: Diagnosis not present

## 2013-02-14 ENCOUNTER — Other Ambulatory Visit (HOSPITAL_COMMUNITY): Payer: Self-pay | Admitting: Internal Medicine

## 2013-02-14 ENCOUNTER — Encounter: Payer: Self-pay | Admitting: Internal Medicine

## 2013-02-14 ENCOUNTER — Ambulatory Visit (INDEPENDENT_AMBULATORY_CARE_PROVIDER_SITE_OTHER): Payer: Medicare Other | Admitting: Internal Medicine

## 2013-02-14 VITALS — BP 122/80 | HR 82 | Ht 66.0 in | Wt 152.0 lb

## 2013-02-14 DIAGNOSIS — R1310 Dysphagia, unspecified: Secondary | ICD-10-CM

## 2013-02-14 DIAGNOSIS — K228 Other specified diseases of esophagus: Secondary | ICD-10-CM | POA: Diagnosis not present

## 2013-02-14 NOTE — Progress Notes (Signed)
Maria Christian 01/03/1925 MRN [id number]  History of Present Illness:  This is an 79 year old nursing home resident who comes with her daughter. She has dementia and she is living in a skilled nursing facility. Patient is being evaluated for severe cervical and pharyngeal dysphagia which was previously evaluated in January and February of this year. She has a history of cough and pneumonia due to aspiration requiring hospitalization. A barium esophagram in January 2014 was a limited study due to patient's inability to stand up. She had mild dysmotility and nonpropulsive peristalsis. The barium tablet passed without difficulty. There was no clear aspiration but there was liquid stasis in the esophagus. Her symptoms improved markedly after we did a endoscopy and dilatation in February 2014 and she was relatively well up until about 4 weeks ago when she started having problems again, especially with liquids and pills.   Past Medical History  Diagnosis Date  . Atrial fibrillation   . Altered mental status   . Encephalopathy   . Parkinson disease   . Hypertension   . GERD (gastroesophageal reflux disease)   . Hyperlipemia   . Diabetes mellitus   . Coronary artery disease   . History of adenomatous polyp of colon 06/24/99  . Enlarged heart   . DEMENTIA   . Edema of lower extremity 07/13/11    right leg more swollen than left leg  . Esophageal dysmotility 07/02/12  . Horseshoe kidney   . Pancreatic lesion 05/22/11    no further workup  per PCP/family due to age  . Anemia   . Peripheral neuropathy   . Depression   . Thrombophlebitis    Past Surgical History  Procedure Laterality Date  . Shoulder surgery  2001    left clavicle excision and acromioplasty  . Abdominal hysterectomy    . Cholecystectomy    . Total hip arthroplasty    . Breast surgery      2 benign tumors removed left breast  . Foot surgery      benign tumors from foot  . Esophageal dilation      several times by Dr. Victorino Dike  . Nose surgery    . Esophagogastroduodenoscopy (egd) with esophageal dilation N/A 08/01/2012    Procedure: ESOPHAGOGASTRODUODENOSCOPY (EGD) WITH ESOPHAGEAL DILATION;  Surgeon: Hart Carwin, MD;  Location: [facility];  Service: Endoscopy;  Laterality: N/A;  with c-arm savory dilators    reports that she has never smoked. She has never used smokeless tobacco. She reports that she does not drink alcohol or use illicit drugs. family history includes Cancer in an other family member; Heart disease in an other family member. No Known Allergies      Review of Systems: Positive for hoarseness, loss of voice, cough, denies heartburn  The remainder of the 10 point ROS is negative except as outlined in H&P   Physical Exam: General appearance  Well developed, in no distress. In a wheelchair. Answers simple questions Eyes- non icteric. HEENT nontraumatic, normocephalic. Mouth no lesions, tongue papillated, no cheilosis. Neck supple without adenopathy, thyroid not enlarged, no carotid bruits, no JVD. Lungs Clear to auscultation bilaterally. A few inspiratory rales. No wheezes Cor normal S1, normal S2, regular rhythm, no murmur,  quiet precordium. Abdomen: Soft protuberant nontender. Rectal: Not done. Extremities no pedal edema. Skin no lesions. Neurological alert and oriented x 3. Psychological normal mood and affect.  Assessment and Plan:  Problem #81 79 year old white female with esophageal dysmotility due to cervical and pharyngeal dysphagia.  This could be due to her Parkinson's disease or due to presbyesophagus. She has history of pneumonia and aspiration but this was not demonstrated on the last speech pathology evaluation. I think her symptoms have progressed and we will request another speech pathology consult. I have discussed with her daughter the possibility of a percutaneous gastrostomy should her symptoms prevent her from proper nutrition. I have also spoken to her son in  [place] and he was very interested in being updated on his mother's condition. They will consider different alternatives.   02/14/2013 Lina Sar

## 2013-02-14 NOTE — Patient Instructions (Addendum)
You have been scheduled for a modified barium swallow at [facility] on Monday 02/24/13 @ 1:00 pm. You should arrive at 12:45 pm. There is no preparation needed.  Please elevate the head of your bed.  Take pantoprazole 40 mg once daily. You can crush the tablets.  We may consider a repeat EGD with dilation or PEG placement if needed.  CC: Dr Murray Hodgkins

## 2013-02-16 ENCOUNTER — Encounter: Payer: Self-pay | Admitting: Internal Medicine

## 2013-02-18 ENCOUNTER — Encounter: Payer: Self-pay | Admitting: Adult Health

## 2013-02-24 ENCOUNTER — Inpatient Hospital Stay (HOSPITAL_COMMUNITY): Admission: RE | Admit: 2013-02-24 | Payer: Medicare Other | Source: Ambulatory Visit

## 2013-02-24 ENCOUNTER — Ambulatory Visit (HOSPITAL_COMMUNITY): Admission: RE | Admit: 2013-02-24 | Payer: Medicare Other | Source: Ambulatory Visit

## 2013-02-28 ENCOUNTER — Non-Acute Institutional Stay (SKILLED_NURSING_FACILITY): Payer: Medicare Other | Admitting: Internal Medicine

## 2013-02-28 DIAGNOSIS — E119 Type 2 diabetes mellitus without complications: Secondary | ICD-10-CM

## 2013-02-28 DIAGNOSIS — I10 Essential (primary) hypertension: Secondary | ICD-10-CM

## 2013-02-28 DIAGNOSIS — I4891 Unspecified atrial fibrillation: Secondary | ICD-10-CM | POA: Diagnosis not present

## 2013-02-28 DIAGNOSIS — E785 Hyperlipidemia, unspecified: Secondary | ICD-10-CM | POA: Diagnosis not present

## 2013-02-28 NOTE — Progress Notes (Signed)
PROGRESS NOTE  DATE: 02/28/13  FACILITY: [facility]  LEVEL OF CARE: SNF  Routine Visit  CHIEF COMPLAINT:  Manage atrial fibrillation, hypertension and diabetes mellitus  HISTORY OF PRESENT ILLNESS:  REASSESSMENT OF ONGOING PROBLEM(S):  ATRIAL FIBRILLATION: the patients a-fib remains stable.  The patient denies DOE, tachycardia, orthopnea, transient neurological sx, palpitations, & PNDs.  No complications noted from the medications currently being used.  HTN: Pt 's HTN remains stable.  Denies CP, sob, DOE, headaches, dizziness or visual disturbances.  No complications from the medications currently being used.  Last BP :123/72, 131/82, 157/76, 145/82, 151/70, 115/76, 123/56.  DM:pt's DM remains stable.  Pt denies polyuria, polydipsia, polyphagia, changes in vision or hypoglycemic episodes.  No complications noted from the medication presently being used.  Last hemoglobin A1c is: 6.4 in 4/14, 7.1 in 8/13.  PAST MEDICAL HISTORY : Reviewed.  No changes.  CURRENT MEDICATIONS: Reviewed per [facility]  REVIEW OF SYSTEMS:  GENERAL: no change in appetite, no fatigue, no weight changes, no fever, chills or weakness RESPIRATORY: no cough, SOB, DOE,, wheezing, hemoptysis CARDIAC: no chest pain, or palpitations, lower extremity swelling present GI: no abdominal pain, diarrhea, constipation, heart burn, nausea or vomiting  PHYSICAL EXAMINATION  VS:  T 96.6       P 62      RR 19     BP 123/56     POX % 98     WT (Lb)  GENERAL: no acute distress, normal body habitus NECK: supple, trachea midline, no neck masses, no thyroid tenderness, no thyromegaly RESPIRATORY: breathing is even & unlabored, BS CTAB CARDIAC: RRR, no murmur,no extra heart sounds, right lower extremity has +2 edema in the left lower extremity has +1 edema GI: abdomen soft, normal BS, no masses, no tenderness, no hepatomegaly, no splenomegaly PSYCHIATRIC: the patient is alert & oriented to person, affect & behavior  appropriate  LABS/RADIOLOGY:  8-14 CO2 32 otherwise BMP normal, WBC 3.4, hemoglobin 9.4, MCV 106.4, platelets 94, HDL 30 otherwise fasting lipid panel normal, folate acid level 21.830, ferritin lever 83, vitamin B12 level 378, Depakote level 1.9  4/14 total protein 5.8 otherwise liver profile normal  3/14 EGD showed presbyesophagus with intermittent spasm  2/14 glucose 105 otherwise BMP normal, iron panel normal, hemoglobin 10.3, MCV 98.1 otherwise CBC normal, platelets 108, fasting lipid panel normal, vitamin B12 level 449, folate 16.1, ferritin 96  ASSESSMENT/PLAN:  diabetes mellitus-well controlled. hypertension-well controlled. atrial fibrillation-rate controlled. hyperlipidemia-well-controlled. Constipation- well controlled. peripheral neuropathy-denies symptoms.  depression-denies symptoms. Allergic rhinitis-Claritin was started  CPT CODE: 16109

## 2013-03-10 DIAGNOSIS — M25559 Pain in unspecified hip: Secondary | ICD-10-CM | POA: Diagnosis not present

## 2013-03-11 ENCOUNTER — Encounter: Payer: Self-pay | Admitting: Nurse Practitioner

## 2013-03-11 ENCOUNTER — Ambulatory Visit (INDEPENDENT_AMBULATORY_CARE_PROVIDER_SITE_OTHER): Payer: PRIVATE HEALTH INSURANCE | Admitting: Nurse Practitioner

## 2013-03-11 VITALS — BP 121/64 | HR 65 | Temp 98.6°F

## 2013-03-11 DIAGNOSIS — R51 Headache: Secondary | ICD-10-CM

## 2013-03-11 NOTE — Patient Instructions (Signed)
Continue clopidogrel 75 mg orally every day  for secondary stroke prevention and maintain strict control of hypertension with blood pressure goal below 130/90, and lipids with LDL cholesterol goal below 100 mg/dL.   Continue Gabapentin for RLS and Aricept for dementia. May use Tylenol 650 mg prn every 6 hours for headache.  Followup in the future with me in 6 months.

## 2013-03-11 NOTE — Progress Notes (Signed)
[facility]  PATIENT: Maria Christian DOB: 1925/04/13   REASON FOR VISIT: follow up HISTORY FROM: patient  HISTORY OF PRESENT ILLNESS: 65 year lady with recurrent headaches likely mixed vascular and tension headaches now worsened likely due to discontinuation of topamax.  Mild Cognitive impairment/mild dementia which is stable. Silent cerebrovascular disease on MRI.  She returns for f/u today after last visit on 08/13/12. She actually did not have an appointment today but SNF where she stays had a mix up and sent her today hence I agreed to see her. She reports increase in headaches since stopping Topamax after last visit.  Headaches are now allmost daily, they are mild to moderate mostly in left temples and occasionally right side as well. She also complains of leg pain and restlessness at night and has been started on gabapentin 100 mg at night with partial benefit.She states her appetite is better and she has been eating more since discontinuing Topamax.   UPDATE 03/11/13 (LL):  Maria Christian comes in for 3 month follow up for headaches.  She is doing well, complains of sharp pain occasionally in left temporal region of head.  Left hip pain is what really seems to be bothering her today.  She had an xray recently which was negative.  At last visit Dr. Pearlean Brownie started her on Depakote for headache prevention, but son who is HCPOA and lives in [place] found out she was on it and demanded that she be taken off of it due to side effect profile.  Daughter who accompanies her in office today says that it really was helping her, but she states "you cannot tell my brother anything."  Mild dementia appears stable, and Maria Christian is pleasantly confused.  She takes plavix daily and appears to be tolerating it well.  She states she sleeps very well.  ROS:  14 system review of systems is positive for weight loss, leg swelling, trouble swallowing, hearing loss,, ringing in ears, shortness of breath,  cough, restless legs, change in appetite  ALLERGIES: No Known Allergies  HOME MEDICATIONS: Outpatient Prescriptions Prior to Visit  Medication Sig Dispense Refill  . ALPRAZolam (XANAX) 0.25 MG tablet Take 0.25 mg by mouth 2 (two) times daily as needed.       Marland Kitchen aspirin 81 MG chewable tablet Chew 81 mg by mouth daily.      . Cholecalciferol ([employer] VITAMIN D3) 1000 UNITS capsule Take 1,000 Units by mouth daily.       . cloNIDine (CATAPRES) 0.1 MG tablet Take 0.1 mg by mouth every 6 (six) hours as needed.      . clopidogrel (PLAVIX) 75 MG tablet Take 75 mg by mouth daily.       . Cranberry 475 MG CAPS Take 1 capsule by mouth 2 (two) times daily.      . diclofenac sodium (VOLTAREN) 1 % GEL Apply 2 g topically 2 (two) times daily as needed.      . donepezil (ARICEPT) 5 MG tablet Take 5 mg by mouth at bedtime.      . dorzolamide (TRUSOPT) 2 % ophthalmic solution Place 1 drop into both eyes 2 (two) times daily.       . furosemide (LASIX) 20 MG tablet Take 20 mg by mouth daily.      Marland Kitchen gabapentin (NEURONTIN) 100 MG capsule Take 100 mg by mouth at bedtime.       Marland Kitchen ketotifen (ZADITOR) 0.025 % ophthalmic solution Place 1 drop into both eyes 2 (  two) times daily.      Marland Kitchen lisinopril (PRINIVIL,ZESTRIL) 10 MG tablet Take 40 mg by mouth daily.       . metFORMIN (GLUCOPHAGE) 500 MG tablet Take 500 mg by mouth 2 (two) times daily with a meal.       . Multiple Vitamins-Minerals (CERTAGEN PO) Take 1 tablet by mouth daily.       . pantoprazole (PROTONIX) 40 MG tablet Take 40 mg by mouth daily.      . polyethylene glycol (MIRALAX / GLYCOLAX) packet Take 17 g by mouth daily.      . potassium chloride SA (K-DUR,KLOR-CON) 20 MEQ tablet Take 20 mEq by mouth daily.       . pravastatin (PRAVACHOL) 20 MG tablet Take 20 mg by mouth at bedtime.       . sertraline (ZOLOFT) 25 MG tablet Take 25 mg by mouth daily.      . Travoprost, BAK Free, (TRAVATAN) 0.004 % SOLN ophthalmic solution Place 1 drop into both eyes at bedtime.       . traZODone (DESYREL) 50 MG tablet Take 100 mg by mouth at bedtime.       . vitamin B-12 (CYANOCOBALAMIN) 500 MCG tablet Take 500 mcg by mouth daily.      Marland Kitchen dextromethorphan-guaiFENesin (MUCINEX DM) 30-600 MG per 12 hr tablet Take 1 tablet by mouth every 12 (twelve) hours.      . divalproex (DEPAKOTE ER) 500 MG 24 hr tablet Take 1 tablet (500 mg total) by mouth daily.  90 tablet  1   No facility-administered medications prior to visit.    PAST MEDICAL HISTORY: Past Medical History  Diagnosis Date  . Atrial fibrillation   . Altered mental status   . Encephalopathy   . Parkinson disease   . Hypertension   . GERD (gastroesophageal reflux disease)   . Hyperlipemia   . Diabetes mellitus   . Coronary artery disease   . History of adenomatous polyp of colon 06/24/99  . Enlarged heart   . DEMENTIA   . Edema of lower extremity 07/13/11    right leg more swollen than left leg  . Esophageal dysmotility 07/02/12  . Horseshoe kidney   . Pancreatic lesion 05/22/11    no further workup  per PCP/family due to age  . Anemia   . Peripheral neuropathy   . Depression   . Thrombophlebitis     PAST SURGICAL HISTORY: Past Surgical History  Procedure Laterality Date  . Shoulder surgery  2001    left clavicle excision and acromioplasty  . Abdominal hysterectomy    . Cholecystectomy    . Total hip arthroplasty    . Breast surgery      2 benign tumors removed left breast  . Foot surgery      benign tumors from foot  . Esophageal dilation      several times by Dr. Victorino Dike  . Nose surgery    . Esophagogastroduodenoscopy (egd) with esophageal dilation N/A 08/01/2012    Procedure: ESOPHAGOGASTRODUODENOSCOPY (EGD) WITH ESOPHAGEAL DILATION;  Surgeon: Hart Carwin, MD;  Location: [facility];  Service: Endoscopy;  Laterality: N/A;  with c-arm savory dilators    FAMILY HISTORY: Family History  Problem Relation Age of Onset  . Cancer    . Heart disease      SOCIAL HISTORY: History    Social History  . Marital Status: Widowed    Spouse Name: N/A    Number of Children: 5  . Years  of Education: 10th   Occupational History  . Retired    Social History Main Topics  . Smoking status: Never Smoker   . Smokeless tobacco: Never Used  . Alcohol Use: No  . Drug Use: No  . Sexual Activity: Not on file   Other Topics Concern  . Not on file   Social History Narrative   Pt lives at [facility] and [place].   Caffeine Use: very small amount daily     PHYSICAL EXAM  Filed Vitals:   03/11/13 1343  BP: 121/64  Pulse: 65  Temp: 98.6 F (37 C)  TempSrc: Oral   There is no weight on file to calculate BMI.  General: well developed, well nourished, seated, in no evident distress  Head: head normocephalic and atraumatic. Orohparynx benign  Neck: supple with no carotid or supraclavicular bruits  Cardiovascular: regular rate and rhythm, no murmurs  Musculoskeletal: no deformity  Skin: no rash/petichiae  Vascular: Normal pulses all extremities   Neurologic Exam  Mental Status: Awake and fully alert. Oriented to place and time. Recent and remote memory intact. Attention span, concentration and fund of knowledge appropriate. Mood and affect appropriate. (Last visit in July MMSE 28/30 with deficits in orientation and repitition.Clock drawing 3/4 and animal naming test 9. ) Cranial Nerves: Pupils equal, briskly reactive to light. Extraocular movements full without nystagmus. Visual fields full to confrontation. Hearing diminished bilaterally. Facial sensation intact. Face, tongue, palate moves normally and symmetrically.  Motor: Normal bulk and tone. Normal strength in all tested extremity muscles.  Sensory.: intact to light touch  Coordination: Rapid alternating movements normal in all extremities. Finger-to-nose and heel-to-shin performed accurately bilaterally.  Gait and Station: Gait deferred, wheelchair bound. Reflexes: 1+ and symmetric.  DIAGNOSTIC DATA  (LABS, IMAGING, TESTING) - I reviewed patient records, labs, notes, testing and imaging myself where available.  ASSESSMENT AND PLAN 27 year lady with recurrent headaches likely mixed vascular and tension headaches.  Mild Cognitive impairment/mild dementia which is stable. Silent cerebrovascular disease on MRI.  Pain and restlessness at night likely RLS.   PLAN:  May use Tylenol 650 mg every 6 hours as needed for headache.   Continue gabapentin 300 mg hs for RLS. Continue plavix for stroke prevention and strict control of hypertension with BP goal below 130/90 and diabetes with HbA1c goal below 6.5%.   F/U in 6 months with Heide Guile, NP   Tawny Asal Mylz Yuan, MSN, NP-C 03/11/2013, 3:40 PM [facility] [address], [address] [place], [place] [address] [phone]

## 2013-03-19 ENCOUNTER — Non-Acute Institutional Stay (SKILLED_NURSING_FACILITY): Payer: PRIVATE HEALTH INSURANCE | Admitting: Internal Medicine

## 2013-03-19 DIAGNOSIS — M79609 Pain in unspecified limb: Secondary | ICD-10-CM | POA: Diagnosis not present

## 2013-03-19 DIAGNOSIS — E785 Hyperlipidemia, unspecified: Secondary | ICD-10-CM

## 2013-03-19 DIAGNOSIS — I4891 Unspecified atrial fibrillation: Secondary | ICD-10-CM

## 2013-03-19 DIAGNOSIS — I10 Essential (primary) hypertension: Secondary | ICD-10-CM

## 2013-03-19 DIAGNOSIS — E119 Type 2 diabetes mellitus without complications: Secondary | ICD-10-CM

## 2013-03-19 DIAGNOSIS — B351 Tinea unguium: Secondary | ICD-10-CM | POA: Diagnosis not present

## 2013-03-19 NOTE — Progress Notes (Signed)
PROGRESS NOTE  DATE: 03/19/13  FACILITY: [facility]  LEVEL OF CARE: SNF  Routine Visit  CHIEF COMPLAINT:  Manage atrial fibrillation, hypertension and diabetes mellitus  HISTORY OF PRESENT ILLNESS:  REASSESSMENT OF ONGOING PROBLEM(S):  ATRIAL FIBRILLATION: the patients a-fib remains stable.  The patient denies DOE, tachycardia, orthopnea, transient neurological sx, palpitations, & PNDs.  No complications noted from the medications currently being used.  HTN: Pt 's HTN remains stable.  Denies CP, sob, DOE, headaches, dizziness or visual disturbances.  No complications from the medications currently being used.  Last BP :123/72, 131/82, 157/76, 145/82, 151/70, 115/76, 123/56, 163/88, 142/73, 158/74.  DM:pt's DM remains stable.  Pt denies polyuria, polydipsia, polyphagia, changes in vision or hypoglycemic episodes.  No complications noted from the medication presently being used.  Last hemoglobin A1c is: 6.4 in 4/14, 7.1 in 8/13.  PAST MEDICAL HISTORY : Reviewed.  No changes.  CURRENT MEDICATIONS: Reviewed per [facility]  REVIEW OF SYSTEMS:  GENERAL: no change in appetite, no fatigue, no weight changes, no fever, chills or weakness RESPIRATORY: no cough, SOB, DOE,, wheezing, hemoptysis CARDIAC: no chest pain, or palpitations, lower extremity swelling present GI: no abdominal pain, diarrhea, constipation, heart burn, nausea or vomiting  PHYSICAL EXAMINATION  VS:  T 97.8       P 85      RR 20     BP 158/74     POX %      WT (Lb)  GENERAL: no acute distress, normal body habitus EYES: normal sclerae, normal conjunctivae, no discharge NECK: supple, trachea midline, no neck masses, no thyroid tenderness, no thyromegaly LYMPHATICS: no cervical LAN, no supraclavicular LAN RESPIRATORY: breathing is even & unlabored, BS CTAB CARDIAC: RRR, no murmur,no extra heart sounds, right lower extremity has +2 edema in the left lower extremity has +1 edema GI: abdomen soft, normal BS, no masses,  no tenderness, no hepatomegaly, no splenomegaly PSYCHIATRIC: the patient is alert & oriented to person, affect & behavior appropriate  LABS/RADIOLOGY:  8-14 CO2 32 otherwise BMP normal, WBC 3.4, hemoglobin 9.4, MCV 106.4, platelets 94, HDL 30 otherwise fasting lipid panel normal, folate acid level 21.830, ferritin lever 83, vitamin B12 level 378, Depakote level 1.9  4/14 total protein 5.8 otherwise liver profile normal  3/14 EGD showed presbyesophagus with intermittent spasm  2/14 glucose 105 otherwise BMP normal, iron panel normal, hemoglobin 10.3, MCV 98.1 otherwise CBC normal, platelets 108, fasting lipid panel normal, vitamin B12 level 449, folate 16.1, ferritin 96  ASSESSMENT/PLAN:  diabetes mellitus-well controlled.  Check HbA1c. hypertension-uncontrolled. start Norvasc 5mg  qd. atrial fibrillation-rate controlled. hyperlipidemia-well-controlled. Constipation- well controlled. peripheral neuropathy-denies symptoms.  depression-denies symptoms. Allergic rhinitis-denies sx.  CPT CODE: [address]

## 2013-03-19 NOTE — Addendum Note (Signed)
Addended by: Angela Cox on: 03/19/2013 09:33 PM   Modules accepted: Level of Service

## 2013-04-03 ENCOUNTER — Other Ambulatory Visit: Payer: Self-pay | Admitting: *Deleted

## 2013-04-03 MED ORDER — ALPRAZOLAM 0.25 MG PO TABS: ORAL_TABLET | ORAL | Status: DC

## 2013-04-09 ENCOUNTER — Non-Acute Institutional Stay (SKILLED_NURSING_FACILITY): Payer: PRIVATE HEALTH INSURANCE | Admitting: Internal Medicine

## 2013-04-09 DIAGNOSIS — I4891 Unspecified atrial fibrillation: Secondary | ICD-10-CM

## 2013-04-09 DIAGNOSIS — E119 Type 2 diabetes mellitus without complications: Secondary | ICD-10-CM

## 2013-04-09 DIAGNOSIS — E785 Hyperlipidemia, unspecified: Secondary | ICD-10-CM

## 2013-04-09 DIAGNOSIS — I10 Essential (primary) hypertension: Secondary | ICD-10-CM

## 2013-04-09 NOTE — Progress Notes (Signed)
PROGRESS NOTE  DATE: 04/09/13  FACILITY: [facility]  LEVEL OF CARE: SNF  Routine Visit  CHIEF COMPLAINT:  Manage atrial fibrillation, hypertension and diabetes mellitus  HISTORY OF PRESENT ILLNESS:  REASSESSMENT OF ONGOING PROBLEM(S):  ATRIAL FIBRILLATION: the patients a-fib remains stable.  The patient denies DOE, tachycardia, orthopnea, transient neurological sx, palpitations, & PNDs.  No complications noted from the medications currently being used.  HTN: Pt 's HTN remains stable.  Denies CP, sob, DOE, headaches, dizziness or visual disturbances.  No complications from the medications currently being used.  Last BP :123/72, 131/82, 157/76, 145/82, 151/70, 115/76, 123/56, 163/88, 142/73, 158/74, 115/57.  DM:pt's DM remains stable.  Pt denies polyuria, polydipsia, polyphagia, changes in vision or hypoglycemic episodes.  No complications noted from the medication presently being used.  Last hemoglobin A1c is: 6.4 in 4/14, 7.1 in 8/13.  PAST MEDICAL HISTORY : Reviewed.  No changes.  CURRENT MEDICATIONS: Reviewed per [facility]  REVIEW OF SYSTEMS:  GENERAL: no change in appetite, no fatigue, no weight changes, no fever, chills or weakness RESPIRATORY: no cough, SOB, DOE,, wheezing, hemoptysis CARDIAC: no chest pain, or palpitations, lower extremity swelling present GI: no abdominal pain, diarrhea, constipation, heart burn, nausea or vomiting  PHYSICAL EXAMINATION  VS:  T 97       P 53      RR 18     BP 115/57     POX %      WT (Lb)  GENERAL: no acute distress, normal body habitus EYES: normal sclerae, normal conjunctivae, no discharge NECK: supple, trachea midline, no neck masses, no thyroid tenderness, no thyromegaly LYMPHATICS: no cervical LAN, no supraclavicular LAN RESPIRATORY: breathing is even & unlabored, BS CTAB CARDIAC: RRR, no murmur,no extra heart sounds, right lower extremity has +2 edema in the left lower extremity has +1 edema GI: abdomen soft, normal BS, no  masses, no tenderness, no hepatomegaly, no splenomegaly PSYCHIATRIC: the patient is alert & oriented to person, affect & behavior appropriate  LABS/RADIOLOGY:  8-14 CO2 32 otherwise BMP normal, WBC 3.4, hemoglobin 9.4, MCV 106.4, platelets 94, HDL 30 otherwise fasting lipid panel normal, folate acid level 21.830, ferritin lever 83, vitamin B12 level 378, Depakote level 1.9  4/14 total protein 5.8 otherwise liver profile normal  3/14 EGD showed presbyesophagus with intermittent spasm  2/14 glucose 105 otherwise BMP normal, iron panel normal, hemoglobin 10.3, MCV 98.1 otherwise CBC normal, platelets 108, fasting lipid panel normal, vitamin B12 level 449, folate 16.1, ferritin 96  ASSESSMENT/PLAN:  diabetes mellitus-well controlled.  Check HbA1c. hypertension-BP improved. atrial fibrillation-rate controlled. hyperlipidemia-well-controlled. Constipation- well controlled. peripheral neuropathy-denies symptoms.  depression-denies symptoms. Allergic rhinitis-denies sx. Anxiety-xanax was started. Chronic pain-medications increased. Check liver profile.  CPT CODE: 45409

## 2013-04-19 ENCOUNTER — Emergency Department (HOSPITAL_COMMUNITY): Payer: PRIVATE HEALTH INSURANCE

## 2013-04-19 ENCOUNTER — Emergency Department (HOSPITAL_COMMUNITY)
Admission: EM | Admit: 2013-04-19 | Discharge: 2013-04-19 | Disposition: A | Discharge status: Skilled Nursing Facility | Payer: PRIVATE HEALTH INSURANCE | Attending: Emergency Medicine | Admitting: Emergency Medicine

## 2013-04-19 ENCOUNTER — Encounter (HOSPITAL_COMMUNITY): Payer: Self-pay | Admitting: Emergency Medicine

## 2013-04-19 DIAGNOSIS — K219 Gastro-esophageal reflux disease without esophagitis: Secondary | ICD-10-CM | POA: Diagnosis not present

## 2013-04-19 DIAGNOSIS — Z860100 Personal history of colon polyps, unspecified: Secondary | ICD-10-CM | POA: Insufficient documentation

## 2013-04-19 DIAGNOSIS — Z7982 Long term (current) use of aspirin: Secondary | ICD-10-CM | POA: Diagnosis not present

## 2013-04-19 DIAGNOSIS — F3289 Other specified depressive episodes: Secondary | ICD-10-CM | POA: Insufficient documentation

## 2013-04-19 DIAGNOSIS — E119 Type 2 diabetes mellitus without complications: Secondary | ICD-10-CM | POA: Diagnosis not present

## 2013-04-19 DIAGNOSIS — D649 Anemia, unspecified: Secondary | ICD-10-CM | POA: Diagnosis not present

## 2013-04-19 DIAGNOSIS — Z79899 Other long term (current) drug therapy: Secondary | ICD-10-CM | POA: Diagnosis not present

## 2013-04-19 DIAGNOSIS — G609 Hereditary and idiopathic neuropathy, unspecified: Secondary | ICD-10-CM | POA: Insufficient documentation

## 2013-04-19 DIAGNOSIS — Z96649 Presence of unspecified artificial hip joint: Secondary | ICD-10-CM | POA: Diagnosis not present

## 2013-04-19 DIAGNOSIS — M129 Arthropathy, unspecified: Secondary | ICD-10-CM | POA: Insufficient documentation

## 2013-04-19 DIAGNOSIS — Z7902 Long term (current) use of antithrombotics/antiplatelets: Secondary | ICD-10-CM | POA: Diagnosis not present

## 2013-04-19 DIAGNOSIS — R609 Edema, unspecified: Secondary | ICD-10-CM | POA: Insufficient documentation

## 2013-04-19 DIAGNOSIS — I10 Essential (primary) hypertension: Secondary | ICD-10-CM | POA: Diagnosis not present

## 2013-04-19 DIAGNOSIS — R112 Nausea with vomiting, unspecified: Secondary | ICD-10-CM | POA: Diagnosis not present

## 2013-04-19 DIAGNOSIS — I2510 Atherosclerotic heart disease of native coronary artery without angina pectoris: Secondary | ICD-10-CM | POA: Insufficient documentation

## 2013-04-19 DIAGNOSIS — G8929 Other chronic pain: Secondary | ICD-10-CM | POA: Insufficient documentation

## 2013-04-19 DIAGNOSIS — M25559 Pain in unspecified hip: Secondary | ICD-10-CM | POA: Insufficient documentation

## 2013-04-19 DIAGNOSIS — F0280 Dementia in other diseases classified elsewhere without behavioral disturbance: Secondary | ICD-10-CM | POA: Diagnosis not present

## 2013-04-19 DIAGNOSIS — E785 Hyperlipidemia, unspecified: Secondary | ICD-10-CM | POA: Diagnosis not present

## 2013-04-19 DIAGNOSIS — M7989 Other specified soft tissue disorders: Secondary | ICD-10-CM | POA: Diagnosis not present

## 2013-04-19 DIAGNOSIS — R109 Unspecified abdominal pain: Secondary | ICD-10-CM | POA: Diagnosis not present

## 2013-04-19 DIAGNOSIS — R509 Fever, unspecified: Secondary | ICD-10-CM | POA: Diagnosis not present

## 2013-04-19 DIAGNOSIS — M1990 Unspecified osteoarthritis, unspecified site: Secondary | ICD-10-CM

## 2013-04-19 DIAGNOSIS — Z8672 Personal history of thrombophlebitis: Secondary | ICD-10-CM | POA: Insufficient documentation

## 2013-04-19 DIAGNOSIS — G20 Parkinson's disease: Secondary | ICD-10-CM | POA: Diagnosis not present

## 2013-04-19 DIAGNOSIS — Q638 Other specified congenital malformations of kidney: Secondary | ICD-10-CM | POA: Insufficient documentation

## 2013-04-19 DIAGNOSIS — M1610 Unilateral primary osteoarthritis, unspecified hip: Secondary | ICD-10-CM | POA: Diagnosis not present

## 2013-04-19 DIAGNOSIS — I4891 Unspecified atrial fibrillation: Secondary | ICD-10-CM | POA: Diagnosis not present

## 2013-04-19 DIAGNOSIS — F329 Major depressive disorder, single episode, unspecified: Secondary | ICD-10-CM | POA: Insufficient documentation

## 2013-04-19 DIAGNOSIS — G20A1 Parkinson's disease without dyskinesia, without mention of fluctuations: Secondary | ICD-10-CM | POA: Insufficient documentation

## 2013-04-19 LAB — CBC WITH DIFFERENTIAL/PLATELET
Basophils Relative: 0 % (ref 0–1)
Eosinophils Absolute: 0.2 10*3/uL (ref 0.0–0.7)
Hemoglobin: 9.9 g/dL — ABNORMAL LOW (ref 12.0–15.0)
MCHC: 33.1 g/dL (ref 30.0–36.0)
Monocytes Relative: 10 % (ref 3–12)
Neutro Abs: 2.5 10*3/uL (ref 1.7–7.7)
Neutrophils Relative %: 55 % (ref 43–77)
Platelets: 126 10*3/uL — ABNORMAL LOW (ref 150–400)
RBC: 2.92 MIL/uL — ABNORMAL LOW (ref 3.87–5.11)

## 2013-04-19 LAB — POCT I-STAT TROPONIN I: Troponin i, poc: 0.03 ng/mL (ref 0.00–0.08)

## 2013-04-19 LAB — COMPREHENSIVE METABOLIC PANEL
ALT: 7 U/L (ref 0–35)
AST: 16 U/L (ref 0–37)
Albumin: 3.6 g/dL (ref 3.5–5.2)
Alkaline Phosphatase: 66 U/L (ref 39–117)
BUN: 17 mg/dL (ref 6–23)
Chloride: 105 mEq/L (ref 96–112)
Potassium: 4.3 mEq/L (ref 3.5–5.1)
Sodium: 141 mEq/L (ref 135–145)
Total Bilirubin: 0.2 mg/dL — ABNORMAL LOW (ref 0.3–1.2)
Total Protein: 6.8 g/dL (ref 6.0–8.3)

## 2013-04-19 LAB — URINALYSIS, ROUTINE W REFLEX MICROSCOPIC
Bilirubin Urine: NEGATIVE
Glucose, UA: NEGATIVE mg/dL
Hgb urine dipstick: NEGATIVE
Ketones, ur: NEGATIVE mg/dL
Nitrite: NEGATIVE
Specific Gravity, Urine: 1.016 (ref 1.005–1.030)
pH: 6 (ref 5.0–8.0)

## 2013-04-19 LAB — LIPASE, BLOOD: Lipase: 22 U/L (ref 11–59)

## 2013-04-19 MED ORDER — HYDROMORPHONE HCL PF 1 MG/ML IJ SOLN
1.0000 mg | Freq: Once | INTRAMUSCULAR | Status: DC
  Filled 2013-04-19: qty 1

## 2013-04-19 MED ORDER — HYDROMORPHONE HCL PF 1 MG/ML IJ SOLN
0.5000 mg | Freq: Once | INTRAMUSCULAR | Status: AC
  Administered 2013-04-19: 0.5 mg via INTRAVENOUS

## 2013-04-19 MED ORDER — MORPHINE SULFATE 4 MG/ML IJ SOLN
6.0000 mg | Freq: Once | INTRAMUSCULAR | Status: AC
  Administered 2013-04-19: 6 mg via INTRAVENOUS
  Filled 2013-04-19: qty 2

## 2013-04-19 MED ORDER — TRAMADOL HCL 50 MG PO TABS: 50.0000 mg | ORAL_TABLET | Freq: Four times a day (QID) | ORAL | Status: DC | PRN

## 2013-04-19 NOTE — ED Notes (Addendum)
Per ems pt is from [facility]. Hx of dementia and a fib, ems called for c/o fever, at present temp 99 degrees F. Staff reported right lower leg swelling starting yesterday, doppler performed this morning. Test was negative. Pt reports nausea starting yesterday, mixed stories from staff and family about if pt vomited. Pt has c/o of left sided pain head to toe sicne August, multiple tests have been performed with no findings for pain.

## 2013-04-19 NOTE — ED Notes (Signed)
Pt transported to CT ?

## 2013-04-19 NOTE — Progress Notes (Signed)
Per protocol, CSW contacted [facility] to confirm residency and level of care.  Per [facility], confirmed pt is a resident in skilled nursing level of care.  [place], [phone]     ED CSW  1:55pm

## 2013-04-19 NOTE — ED Provider Notes (Signed)
CSN: [id number]     Arrival date & time 04/19/13  1210 History   First MD Initiated Contact with Patient 04/19/13 1221     Chief Complaint  Patient presents with  . Fever   (Consider location/radiation/quality/duration/timing/severity/associated sxs/prior Treatment) The history is provided by the patient and a relative.  Maria Christian is a 79 y.o. female history of atrial fibrillation not on Coumadin, Parkinson dementia, hypertension, diabetes, CAD, here presenting with diffuse muscle aches and low-grade temperature. She's been having muscle aches especially worse on the left side for the last several months. She had x-ray of the left hip previously and showed no fracture and was placed on some Tylenol then ibuprofen and now pain patch. She also has some headaches on the left side and had seen neurology was placed on gabapentin and Tylenol. Yesterday her pain was worse and she had decreased appetite but did not vomit. Denies chest pain or abdominal pain. Denies any recent falls but she states her left hip hurts her. Today he was sent in by nursing home because she has had low-grade temperature of 100F. she also had right leg swelling but this morning had a DVT study done that showed no DVT.    Past Medical History  Diagnosis Date  . Atrial fibrillation   . Altered mental status   . Encephalopathy   . Parkinson disease   . Hypertension   . GERD (gastroesophageal reflux disease)   . Hyperlipemia   . Diabetes mellitus   . Coronary artery disease   . History of adenomatous polyp of colon 06/24/99  . Enlarged heart   . DEMENTIA   . Edema of lower extremity 07/13/11    right leg more swollen than left leg  . Esophageal dysmotility 07/02/12  . Horseshoe kidney   . Pancreatic lesion 05/22/11    no further workup  per PCP/family due to age  . Anemia   . Peripheral neuropathy   . Depression   . Thrombophlebitis    Past Surgical History  Procedure Laterality Date  . Shoulder surgery  2001     left clavicle excision and acromioplasty  . Abdominal hysterectomy    . Cholecystectomy    . Total hip arthroplasty    . Breast surgery      2 benign tumors removed left breast  . Foot surgery      benign tumors from foot  . Esophageal dilation      several times by Dr. Victorino Dike  . Nose surgery    . Esophagogastroduodenoscopy (egd) with esophageal dilation N/A 08/01/2012    Procedure: ESOPHAGOGASTRODUODENOSCOPY (EGD) WITH ESOPHAGEAL DILATION;  Surgeon: Hart Carwin, MD;  Location: [facility];  Service: Endoscopy;  Laterality: N/A;  with c-arm savory dilators   Family History  Problem Relation Age of Onset  . Cancer    . Heart disease     History  Substance Use Topics  . Smoking status: Never Smoker   . Smokeless tobacco: Never Used  . Alcohol Use: No   OB History   Grav Para Term Preterm Abortions TAB SAB Ect Mult Living                 Review of Systems  Constitutional: Positive for fever.  Musculoskeletal:       L hip pain   All other systems reviewed and are negative.    Allergies  Review of patient's allergies indicates no known allergies.  Home Medications   Current Outpatient Rx  Name  Route  Sig  Dispense  Refill  . acetaminophen (TYLENOL) 325 MG tablet   Oral   Take 650 mg by mouth every 6 (six) hours as needed.         . ALPRAZolam (XANAX) 0.25 MG tablet   Oral   Take 0.25 mg by mouth at bedtime as needed for anxiety.         Marland Kitchen amLODipine (NORVASC) 5 MG tablet   Oral   Take 5 mg by mouth daily.         Marland Kitchen aspirin 81 MG chewable tablet   Oral   Chew 81 mg by mouth daily.         . Cholecalciferol ([employer] VITAMIN D3) 1000 UNITS capsule   Oral   Take 1,000 Units by mouth daily.          . clopidogrel (PLAVIX) 75 MG tablet   Oral   Take 75 mg by mouth daily.          . Cranberry 475 MG CAPS   Oral   Take 1 capsule by mouth 2 (two) times daily.         Marland Kitchen dextromethorphan-guaiFENesin (MUCINEX DM) 30-600 MG per 12 hr  tablet   Oral   Take 1 tablet by mouth every 12 (twelve) hours.         Marland Kitchen donepezil (ARICEPT) 5 MG tablet   Oral   Take 5 mg by mouth at bedtime.         . dorzolamide (TRUSOPT) 2 % ophthalmic solution   Both Eyes   Place 1 drop into both eyes 3 (three) times daily.          . furosemide (LASIX) 20 MG tablet   Oral   Take 20 mg by mouth daily.         Marland Kitchen gabapentin (NEURONTIN) 300 MG capsule   Oral   Take 300 mg by mouth 2 (two) times daily.         Marland Kitchen ibuprofen (ADVIL,MOTRIN) 400 MG tablet   Oral   Take 400 mg by mouth every 6 (six) hours as needed.         Marland Kitchen ketotifen (ZADITOR) 0.025 % ophthalmic solution   Both Eyes   Place 1 drop into both eyes 2 (two) times daily.         Marland Kitchen lidocaine (LIDODERM) 5 %   Transdermal   Place 1 patch onto the skin daily. Remove & Discard patch within 12 hours or as directed by MD         . lisinopril (PRINIVIL,ZESTRIL) 40 MG tablet   Oral   Take 40 mg by mouth daily.         Marland Kitchen loratadine (CLARITIN) 10 MG tablet   Oral   Take 10 mg by mouth daily.         . metFORMIN (GLUCOPHAGE) 1000 MG tablet   Oral   Take 1,000 mg by mouth 2 (two) times daily with a meal.         . Multiple Vitamins-Minerals (CERTAGEN PO)   Oral   Take 1 tablet by mouth daily.          . ondansetron (ZOFRAN) 4 MG tablet   Oral   Take 4 mg by mouth every 8 (eight) hours as needed for nausea or vomiting.         . pantoprazole (PROTONIX) 40 MG tablet   Oral   Take 40 mg by mouth daily.         Marland Kitchen  Polyethyl Glycol-Propyl Glycol (SYSTANE OP)   Both Eyes   Place 1 drop into both eyes 4 (four) times daily.         . polyethylene glycol (MIRALAX / GLYCOLAX) packet   Oral   Take 17 g by mouth daily.         . potassium chloride SA (K-DUR,KLOR-CON) 20 MEQ tablet   Oral   Take 20 mEq by mouth daily.          . pravastatin (PRAVACHOL) 20 MG tablet   Oral   Take 20 mg by mouth at bedtime.          . sertraline (ZOLOFT) 50  MG tablet   Oral   Take 50 mg by mouth at bedtime.         . Travoprost, BAK Free, (TRAVATAN) 0.004 % SOLN ophthalmic solution   Both Eyes   Place 2 drops into both eyes at bedtime.          . traZODone (DESYREL) 50 MG tablet   Oral   Take 100 mg by mouth at bedtime.          . vitamin B-12 (CYANOCOBALAMIN) 500 MCG tablet   Oral   Take 500 mcg by mouth daily.         Marland Kitchen ALPRAZolam (XANAX) 0.25 MG tablet   Oral   Take 0.25 mg by mouth 2 (two) times daily as needed for anxiety. Take 1 tablet by mouth daily at 2 pm for anxiety.         . cloNIDine (CATAPRES) 0.1 MG tablet   Oral   Take 0.1 mg by mouth every 6 (six) hours as needed.         . diclofenac sodium (VOLTAREN) 1 % GEL   Topical   Apply 2 g topically 2 (two) times daily as needed.          BP 111/51  Pulse 47  Temp(Src) 97.8 F (36.6 C) (Oral)  Resp 17  SpO2 98% Physical Exam  Nursing note and vitals reviewed. Constitutional:  Chronically ill, demented. Slightly uncomfortable   HENT:  Head: Normocephalic.  Mouth/Throat: Oropharynx is clear and moist.  Eyes: Conjunctivae are normal. Pupils are equal, round, and reactive to light.  Neck: Normal range of motion. Neck supple.  Cardiovascular: Normal rate, regular rhythm and normal heart sounds.   Pulmonary/Chest: Effort normal.  Diminished breath sounds on bases   Abdominal: Soft. Bowel sounds are normal. She exhibits no distension. There is no tenderness. There is no rebound and no guarding.  Musculoskeletal:  R hip nl ROM. R leg 2+ edema. Unable to range L hip. 1+ edema on L leg.   Neurological: She is alert.  Demented. Moving all extremities except pain on moving L leg   Skin: Skin is warm and dry.  Psychiatric: She has a normal mood and affect. Her behavior is normal. Thought content normal.    ED Course  Procedures (including critical care time) Labs Review Labs Reviewed  CBC WITH DIFFERENTIAL - Abnormal; Notable for the following:     RBC 2.92 (*)    Hemoglobin 9.9 (*)    HCT 29.9 (*)    MCV 102.4 (*)    Platelets 126 (*)    All other components within normal limits  COMPREHENSIVE METABOLIC PANEL - Abnormal; Notable for the following:    Glucose, Bld 150 (*)    Total Bilirubin 0.2 (*)    GFR calc non Af Amer 51 (*)  GFR calc Af Amer 59 (*)    All other components within normal limits  LIPASE, BLOOD  URINALYSIS, ROUTINE W REFLEX MICROSCOPIC  CG4 I-STAT (LACTIC ACID)  POCT I-STAT TROPONIN I   Imaging Review Dg Chest 1 View  04/19/2013   CLINICAL DATA:  Status post fall today PE left hip pain.  EXAM: CHEST - 1 VIEW  COMPARISON:  07/14/2011  FINDINGS: The heart size is enlarged. There is no focal consolidation, pleural effusion or pneumothorax. There is mild bilateral interstitial thickening likely chronic. There is no congestive failure. The osseous structures are unremarkable. There is thoracic aortic atherosclerosis.  IMPRESSION: No active disease.   Electronically Signed   By: Elige Ko   On: 04/19/2013 13:51   Dg Hip Complete Left  04/19/2013   CLINICAL DATA:  Status post fall with left hip pain.  EXAM: LEFT HIP - COMPLETE 2+ VIEW  COMPARISON:  None.  FINDINGS: There is a left hip arthroplasty without hardware failure or complication. There is no fracture or dislocation. The right hip is unremarkable.  There is generalized osteopenia.  IMPRESSION: Left hip arthroplasty without hardware failure or complication. No fracture or dislocation.   Electronically Signed   By: Elige Ko   On: 04/19/2013 13:48   Ct Hip Left Wo Contrast  04/19/2013   CLINICAL DATA:  Patient fell with pain in the left hip.  EXAM: CT OF THE LEFT HIP WITHOUT CONTRAST  TECHNIQUE: Multidetector CT imaging was performed according to the standard protocol. Multiplanar CT image reconstructions were also generated.  COMPARISON:  Left hip x-ray 04/19/2013, CT abdomen/pelvis 05/22/2011  FINDINGS: There is a left hip arthroplasty with beam hardening  artifact resulting from the arthroplasty obscuring the surrounding soft tissues and osseous structures. There is no obvious left hip fracture. There is no dislocation. There is no hardware failure or complication. The left superior and inferior pubic rami are intact.  There are mild degenerative changes of the pubic symphysis. There is no hematoma or fluid collection. There is fat stranding within the subcutaneous fat overlying the greater trochanter likely reflecting contusion.  There is vascular atherosclerotic disease.  IMPRESSION: No acute osseous injury of the left hip. There is a left hip arthroplasty. There is beam hardening artifact resulting from the arthroplasty which obscures the surrounding soft tissues and osseous structures limiting evaluation.   Electronically Signed   By: Elige Ko   On: 04/19/2013 15:17    EKG Interpretation     Ventricular Rate:  64 PR Interval:    QRS Duration: 83 QT Interval:  436 QTC Calculation: 450 R Axis:   24 Text Interpretation:  Atrial fibrillation Low voltage, extremity leads No significant change since last tracing            MDM  No diagnosis found. Maria Christian is a 79 y.o. female here with low grade temp, pain. Will do sepsis workup and r/o UTI vs pneumonia. More pain on L hip so will get xray to r/o fracture.   3:43 PM Labs, UA, CXR nl. L hip replacement seemed positioned incorrectly so I got CT. CT showed that hip was in the right place and has no dislocation. She is still unable to range it. I called Dr. Eulah Pont, who reviewed the images. Recommend pain control and outpatient f/u for possible bone scan.   Richardean Canal, MD 04/19/13 [phone]

## 2013-04-19 NOTE — ED Notes (Signed)
Called [facility] multiple times to give report to nurse. Called number provided on pt's transfer sheet, dialed multiple extensions, no answering machine to leave message

## 2013-04-19 NOTE — ED Notes (Signed)
md at bedside  Pt alert, hx of dementia. Respirations even and unlabored, bilateral symmetrical rise and fall of chest. Skin warm and dry. In no acute distress. Denies needs.

## 2013-04-19 NOTE — ED Notes (Signed)
ptar called for pt transportation back to [facility] 

## 2013-04-19 NOTE — ED Notes (Signed)
Bed: YN82 Expected date: 04/19/13 Expected time: 12:08 PM Means of arrival: Ambulance Comments: Fever/HTN

## 2013-04-22 ENCOUNTER — Other Ambulatory Visit: Payer: Self-pay | Admitting: *Deleted

## 2013-04-22 MED ORDER — TRAMADOL HCL 50 MG PO TABS: ORAL_TABLET | ORAL | Status: DC

## 2013-04-24 DIAGNOSIS — R110 Nausea: Secondary | ICD-10-CM | POA: Diagnosis not present

## 2013-04-25 DIAGNOSIS — M25559 Pain in unspecified hip: Secondary | ICD-10-CM | POA: Diagnosis not present

## 2013-04-29 DIAGNOSIS — R109 Unspecified abdominal pain: Secondary | ICD-10-CM | POA: Diagnosis not present

## 2013-05-14 ENCOUNTER — Non-Acute Institutional Stay (SKILLED_NURSING_FACILITY): Payer: PRIVATE HEALTH INSURANCE | Admitting: Internal Medicine

## 2013-05-14 DIAGNOSIS — I10 Essential (primary) hypertension: Secondary | ICD-10-CM

## 2013-05-14 DIAGNOSIS — E785 Hyperlipidemia, unspecified: Secondary | ICD-10-CM

## 2013-05-14 DIAGNOSIS — I4891 Unspecified atrial fibrillation: Secondary | ICD-10-CM

## 2013-05-14 DIAGNOSIS — E119 Type 2 diabetes mellitus without complications: Secondary | ICD-10-CM

## 2013-05-16 ENCOUNTER — Encounter: Payer: Self-pay | Admitting: Internal Medicine

## 2013-05-16 NOTE — Progress Notes (Signed)
PROGRESS NOTE  DATE: 05/14/13  FACILITY: [facility]  LEVEL OF CARE: SNF  Routine Visit  CHIEF COMPLAINT:  Manage atrial fibrillation, hypertension and diabetes mellitus  HISTORY OF PRESENT ILLNESS:  REASSESSMENT OF ONGOING PROBLEM(S):  ATRIAL FIBRILLATION: the patients a-fib remains stable.  The patient denies DOE, tachycardia, orthopnea, transient neurological sx, palpitations, & PNDs.  No complications noted from the medications currently being used.  HTN: Pt 's HTN remains stable.  Denies CP, sob, DOE, headaches, dizziness or visual disturbances.  No complications from the medications currently being used.  Last BP :123/72, 131/82, 157/76, 145/82, 151/70, 115/76, 123/56, 163/88, 142/73, 158/74, 115/57, 127/68.  DM:pt's DM remains stable.  Pt denies polyuria, polydipsia, polyphagia, changes in vision or hypoglycemic episodes.  No complications noted from the medication presently being used.  Last hemoglobin A1c is: 6.4 in 4/14, 7.1 in 8/13, in 11-14 hemoglobin 6.4  PAST MEDICAL HISTORY : Reviewed.  No changes.  CURRENT MEDICATIONS: Reviewed per [facility]  REVIEW OF SYSTEMS:  GENERAL: no change in appetite, no fatigue, no weight changes, no fever, chills or weakness RESPIRATORY: no cough, SOB, DOE,, wheezing, hemoptysis CARDIAC: no chest pain, or palpitations, lower extremity swelling present GI: no abdominal pain, diarrhea, constipation, heart burn, nausea or vomiting  PHYSICAL EXAMINATION  VS:  T 98       P 58      RR 20     BP 127/68     POX %      WT (Lb)  GENERAL: no acute distress, normal body habitus EYES: normal sclerae, normal conjunctivae, no discharge NECK: supple, trachea midline, no neck masses, no thyroid tenderness, no thyromegaly LYMPHATICS: no cervical LAN, no supraclavicular LAN RESPIRATORY: breathing is even & unlabored, BS CTAB CARDIAC: RRR, no murmur,no extra heart sounds, right lower extremity has +2 edema in the left lower extremity has +1  edema GI: abdomen soft, normal BS, no masses, no tenderness, no hepatomegaly, no splenomegaly PSYCHIATRIC: the patient is alert & oriented to person, affect & behavior appropriate  LABS/RADIOLOGY:  11-14 hemoglobin 9.1, MCV 106.8, platelets 138, WBC 5.3, liver profile normal  8-14 CO2 32 otherwise BMP normal, WBC 3.4, hemoglobin 9.4, MCV 106.4, platelets 94, HDL 30 otherwise fasting lipid panel normal, folate acid level 21.830, ferritin lever 83, vitamin B12 level 378, Depakote level 1.9  4/14 total protein 5.8 otherwise liver profile normal  3/14 EGD showed presbyesophagus with intermittent spasm  2/14 glucose 105 otherwise BMP normal, iron panel normal, hemoglobin 10.3, MCV 98.1 otherwise CBC normal, platelets 108, fasting lipid panel normal, vitamin B12 level 449, folate 16.1, ferritin 96  ASSESSMENT/PLAN:  diabetes mellitus-well controlled. hypertension-well controlled. atrial fibrillation-rate controlled. hyperlipidemia-well-controlled. Constipation- well controlled. peripheral neuropathy-denies symptoms.  depression-denies symptoms. Allergic rhinitis-denies sx. Anxiety-stable. Chronic pain-medications increased.  CPT CODE: 16109

## 2013-05-23 ENCOUNTER — Emergency Department (HOSPITAL_COMMUNITY): Payer: PRIVATE HEALTH INSURANCE

## 2013-05-23 ENCOUNTER — Encounter (HOSPITAL_COMMUNITY): Payer: Self-pay | Admitting: Emergency Medicine

## 2013-05-23 ENCOUNTER — Inpatient Hospital Stay (HOSPITAL_COMMUNITY)
Admission: EM | Admit: 2013-05-23 | Discharge: 2013-06-02 | DRG: 389 | Disposition: A | Discharge status: Skilled Nursing Facility | Payer: PRIVATE HEALTH INSURANCE | Attending: Internal Medicine | Admitting: Internal Medicine

## 2013-05-23 DIAGNOSIS — R142 Eructation: Secondary | ICD-10-CM | POA: Diagnosis present

## 2013-05-23 DIAGNOSIS — M25519 Pain in unspecified shoulder: Secondary | ICD-10-CM | POA: Clinically undetermined

## 2013-05-23 DIAGNOSIS — L89159 Pressure ulcer of sacral region, unspecified stage: Secondary | ICD-10-CM | POA: Diagnosis present

## 2013-05-23 DIAGNOSIS — E876 Hypokalemia: Secondary | ICD-10-CM

## 2013-05-23 DIAGNOSIS — F32A Depression, unspecified: Secondary | ICD-10-CM

## 2013-05-23 DIAGNOSIS — I10 Essential (primary) hypertension: Secondary | ICD-10-CM

## 2013-05-23 DIAGNOSIS — Z66 Do not resuscitate: Secondary | ICD-10-CM | POA: Diagnosis present

## 2013-05-23 DIAGNOSIS — Z8673 Personal history of transient ischemic attack (TIA), and cerebral infarction without residual deficits: Secondary | ICD-10-CM

## 2013-05-23 DIAGNOSIS — H9190 Unspecified hearing loss, unspecified ear: Secondary | ICD-10-CM | POA: Diagnosis present

## 2013-05-23 DIAGNOSIS — Z96649 Presence of unspecified artificial hip joint: Secondary | ICD-10-CM

## 2013-05-23 DIAGNOSIS — I4891 Unspecified atrial fibrillation: Secondary | ICD-10-CM

## 2013-05-23 DIAGNOSIS — N390 Urinary tract infection, site not specified: Secondary | ICD-10-CM

## 2013-05-23 DIAGNOSIS — Z87891 Personal history of nicotine dependence: Secondary | ICD-10-CM

## 2013-05-23 DIAGNOSIS — E785 Hyperlipidemia, unspecified: Secondary | ICD-10-CM

## 2013-05-23 DIAGNOSIS — K219 Gastro-esophageal reflux disease without esophagitis: Secondary | ICD-10-CM | POA: Diagnosis present

## 2013-05-23 DIAGNOSIS — G20A1 Parkinson's disease without dyskinesia, without mention of fluctuations: Secondary | ICD-10-CM

## 2013-05-23 DIAGNOSIS — I272 Pulmonary hypertension, unspecified: Secondary | ICD-10-CM

## 2013-05-23 DIAGNOSIS — L89150 Pressure ulcer of sacral region, unstageable: Secondary | ICD-10-CM

## 2013-05-23 DIAGNOSIS — F3289 Other specified depressive episodes: Secondary | ICD-10-CM | POA: Diagnosis present

## 2013-05-23 DIAGNOSIS — R112 Nausea with vomiting, unspecified: Secondary | ICD-10-CM

## 2013-05-23 DIAGNOSIS — D696 Thrombocytopenia, unspecified: Secondary | ICD-10-CM | POA: Diagnosis present

## 2013-05-23 DIAGNOSIS — L89109 Pressure ulcer of unspecified part of back, unspecified stage: Secondary | ICD-10-CM | POA: Diagnosis present

## 2013-05-23 DIAGNOSIS — F411 Generalized anxiety disorder: Secondary | ICD-10-CM | POA: Diagnosis present

## 2013-05-23 DIAGNOSIS — N200 Calculus of kidney: Secondary | ICD-10-CM | POA: Diagnosis not present

## 2013-05-23 DIAGNOSIS — Z860100 Personal history of colon polyps, unspecified: Secondary | ICD-10-CM

## 2013-05-23 DIAGNOSIS — G20 Parkinson's disease: Secondary | ICD-10-CM | POA: Diagnosis present

## 2013-05-23 DIAGNOSIS — I5032 Chronic diastolic (congestive) heart failure: Secondary | ICD-10-CM

## 2013-05-23 DIAGNOSIS — E119 Type 2 diabetes mellitus without complications: Secondary | ICD-10-CM

## 2013-05-23 DIAGNOSIS — R197 Diarrhea, unspecified: Secondary | ICD-10-CM | POA: Diagnosis present

## 2013-05-23 DIAGNOSIS — L8992 Pressure ulcer of unspecified site, stage 2: Secondary | ICD-10-CM | POA: Diagnosis present

## 2013-05-23 DIAGNOSIS — R0989 Other specified symptoms and signs involving the circulatory and respiratory systems: Secondary | ICD-10-CM | POA: Diagnosis not present

## 2013-05-23 DIAGNOSIS — I509 Heart failure, unspecified: Secondary | ICD-10-CM | POA: Diagnosis present

## 2013-05-23 DIAGNOSIS — I2789 Other specified pulmonary heart diseases: Secondary | ICD-10-CM | POA: Diagnosis present

## 2013-05-23 DIAGNOSIS — F0390 Unspecified dementia without behavioral disturbance: Secondary | ICD-10-CM

## 2013-05-23 DIAGNOSIS — R141 Gas pain: Secondary | ICD-10-CM | POA: Diagnosis present

## 2013-05-23 DIAGNOSIS — G459 Transient cerebral ischemic attack, unspecified: Secondary | ICD-10-CM | POA: Diagnosis present

## 2013-05-23 DIAGNOSIS — F0391 Unspecified dementia with behavioral disturbance: Secondary | ICD-10-CM | POA: Diagnosis present

## 2013-05-23 DIAGNOSIS — I2510 Atherosclerotic heart disease of native coronary artery without angina pectoris: Secondary | ICD-10-CM | POA: Diagnosis present

## 2013-05-23 DIAGNOSIS — K5900 Constipation, unspecified: Secondary | ICD-10-CM

## 2013-05-23 DIAGNOSIS — Z8249 Family history of ischemic heart disease and other diseases of the circulatory system: Secondary | ICD-10-CM

## 2013-05-23 DIAGNOSIS — F329 Major depressive disorder, single episode, unspecified: Secondary | ICD-10-CM | POA: Diagnosis present

## 2013-05-23 DIAGNOSIS — R1115 Cyclical vomiting syndrome unrelated to migraine: Secondary | ICD-10-CM | POA: Diagnosis present

## 2013-05-23 DIAGNOSIS — K562 Volvulus: Principal | ICD-10-CM

## 2013-05-23 LAB — HEPATIC FUNCTION PANEL
ALT: 8 U/L (ref 0–35)
AST: 18 U/L (ref 0–37)
Alkaline Phosphatase: 63 U/L (ref 39–117)
Bilirubin, Direct: <0.1 mg/dL (ref 0.0–0.3)
Total Bilirubin: 0.3 mg/dL (ref 0.3–1.2)
Total Protein: 7 g/dL (ref 6.0–8.3)

## 2013-05-23 LAB — BASIC METABOLIC PANEL
BUN: 9 mg/dL (ref 6–23)
CO2: 25 mEq/L (ref 19–32)
Calcium: 9.4 mg/dL (ref 8.4–10.5)
Creatinine, Ser: 0.79 mg/dL (ref 0.50–1.10)
GFR calc non Af Amer: 73 mL/min — ABNORMAL LOW (ref >90)
Glucose, Bld: 164 mg/dL — ABNORMAL HIGH (ref 70–99)
Potassium: 2.5 mEq/L — CL (ref 3.5–5.1)
Sodium: 146 mEq/L — ABNORMAL HIGH (ref 135–145)

## 2013-05-23 LAB — CBC
HCT: 30.6 % — ABNORMAL LOW (ref 36.0–46.0)
Hemoglobin: 10.1 g/dL — ABNORMAL LOW (ref 12.0–15.0)
MCHC: 33 g/dL (ref 30.0–36.0)
Platelets: 132 10*3/uL — ABNORMAL LOW (ref 150–400)
RBC: 3.07 MIL/uL — ABNORMAL LOW (ref 3.87–5.11)

## 2013-05-23 MED ORDER — FENTANYL CITRATE 0.05 MG/ML IJ SOLN
50.0000 ug | Freq: Once | INTRAMUSCULAR | Status: AC
  Administered 2013-05-23: 50 ug via INTRAVENOUS
  Filled 2013-05-23: qty 2

## 2013-05-23 MED ORDER — POTASSIUM CHLORIDE 10 MEQ/100ML IV SOLN
10.0000 meq | INTRAVENOUS | Status: AC
  Administered 2013-05-23 – 2013-05-24 (×6): 10 meq via INTRAVENOUS
  Filled 2013-05-23 (×4): qty 100

## 2013-05-23 MED ORDER — POTASSIUM CHLORIDE 10 MEQ/100ML IV SOLN
10.0000 meq | Freq: Once | INTRAVENOUS | Status: DC
  Filled 2013-05-23: qty 100

## 2013-05-23 MED ORDER — SODIUM CHLORIDE 0.9 % IV BOLUS (SEPSIS)
1000.0000 mL | Freq: Once | INTRAVENOUS | Status: AC
  Administered 2013-05-23: 1000 mL via INTRAVENOUS

## 2013-05-23 MED ORDER — IOHEXOL 300 MG/ML  SOLN
100.0000 mL | Freq: Once | INTRAMUSCULAR | Status: AC | PRN
  Administered 2013-05-23: 100 mL via INTRAVENOUS

## 2013-05-23 MED ORDER — DEXTROSE 5 % IV SOLN
1.0000 g | Freq: Once | INTRAVENOUS | Status: AC
  Administered 2013-05-24: 1 g via INTRAVENOUS
  Filled 2013-05-23: qty 10

## 2013-05-23 NOTE — ED Notes (Signed)
K+ of 2.5, MD made aware.

## 2013-05-23 NOTE — ED Notes (Signed)
Bed: ZO10 Expected date:  Expected time:  Means of arrival:  Comments: EMS-[facility]

## 2013-05-23 NOTE — ED Provider Notes (Signed)
CSN: [id number]     Arrival date & time 05/23/13  1916 History   First MD Initiated Contact with Patient 05/23/13 1927     Chief Complaint  Patient presents with  . Emesis   (Consider location/radiation/quality/duration/timing/severity/associated sxs/prior Treatment) Patient is a 79 y.o. female presenting with vomiting. The history is provided by the patient and a relative.  Emesis Severity:  Moderate Duration:  2 days Timing:  Constant Number of daily episodes:  Multipl;e Quality:  Stomach contents Progression:  Worsening Chronicity:  New Context: not post-tussive   Relieved by:  Nothing Worsened by:  Nothing tried Associated symptoms: abdominal pain   Associated symptoms: no chills, no fever, no sore throat and no URI     Past Medical History  Diagnosis Date  . Atrial fibrillation   . Altered mental status   . Encephalopathy   . Parkinson disease   . Hypertension   . GERD (gastroesophageal reflux disease)   . Hyperlipemia   . Diabetes mellitus   . Coronary artery disease   . History of adenomatous polyp of colon 06/24/99  . Enlarged heart   . DEMENTIA   . Edema of lower extremity 07/13/11    right leg more swollen than left leg  . Esophageal dysmotility 07/02/12  . Horseshoe kidney   . Pancreatic lesion 05/22/11    no further workup  per PCP/family due to age  . Anemia   . Peripheral neuropathy   . Depression   . Thrombophlebitis    Past Surgical History  Procedure Laterality Date  . Shoulder surgery  2001    left clavicle excision and acromioplasty  . Abdominal hysterectomy    . Cholecystectomy    . Total hip arthroplasty    . Breast surgery      2 benign tumors removed left breast  . Foot surgery      benign tumors from foot  . Esophageal dilation      several times by Dr. Victorino Dike  . Nose surgery    . Esophagogastroduodenoscopy (egd) with esophageal dilation N/A 08/01/2012    Procedure: ESOPHAGOGASTRODUODENOSCOPY (EGD) WITH ESOPHAGEAL DILATION;   Surgeon: Hart Carwin, MD;  Location: [facility];  Service: Endoscopy;  Laterality: N/A;  with c-arm savory dilators   Family History  Problem Relation Age of Onset  . Cancer    . Heart disease     History  Substance Use Topics  . Smoking status: Former Games developer  . Smokeless tobacco: Never Used  . Alcohol Use: No   OB History   Grav Para Term Preterm Abortions TAB SAB Ect Mult Living                 Review of Systems  Constitutional: Negative for fever and chills.  HENT: Negative for sore throat.   Respiratory: Negative for cough and shortness of breath.   Gastrointestinal: Positive for nausea, vomiting and abdominal pain.  All other systems reviewed and are negative.    Allergies  Review of patient's allergies indicates no known allergies.  Home Medications   Current Outpatient Rx  Name  Route  Sig  Dispense  Refill  . acetaminophen (TYLENOL) 325 MG tablet   Oral   Take 650 mg by mouth 2 (two) times daily. For OA.         Marland Kitchen ALPRAZolam (XANAX) 0.25 MG tablet   Oral   Take 0.25 mg by mouth 2 (two) times daily as needed for anxiety. Take 1 tablet by mouth daily  at 2 pm for anxiety.         . ALPRAZolam (XANAX) 0.25 MG tablet   Oral   Take 0.25 mg by mouth at bedtime as needed for anxiety.         Marland Kitchen aspirin 81 MG chewable tablet   Oral   Chew 81 mg by mouth daily.         . cefTRIAXone 1 g/4 mLs in lidocaine (with preservative) injection   Intramuscular   Inject 1 g into the muscle once.         . cholecalciferol (VITAMIN D) 1000 UNITS tablet   Oral   Take 1,000 Units by mouth daily.         . clopidogrel (PLAVIX) 75 MG tablet   Oral   Take 75 mg by mouth daily.          . Cranberry 475 MG CAPS   Oral   Take 1 capsule by mouth 2 (two) times daily.         Marland Kitchen dextromethorphan-guaiFENesin (MUCINEX DM) 30-600 MG per 12 hr tablet   Oral   Take 1 tablet by mouth every 12 (twelve) hours.         Marland Kitchen donepezil (ARICEPT) 5 MG tablet    Oral   Take 5 mg by mouth at bedtime.         . dorzolamide (TRUSOPT) 2 % ophthalmic solution   Both Eyes   Place 1 drop into both eyes 3 (three) times daily.          . furosemide (LASIX) 40 MG tablet   Oral   Take 40 mg by mouth daily.         Marland Kitchen gabapentin (NEURONTIN) 300 MG capsule   Oral   Take 300 mg by mouth 2 (two) times daily.         Marland Kitchen ibuprofen (ADVIL,MOTRIN) 400 MG tablet   Oral   Take 400 mg by mouth daily at 12 noon.          Marland Kitchen ketotifen (ZADITOR) 0.025 % ophthalmic solution   Both Eyes   Place 1 drop into both eyes 2 (two) times daily.         Marland Kitchen lactulose (CHRONULAC) 10 GM/15ML solution   Oral   Take 20 g by mouth 2 (two) times daily.         Marland Kitchen lidocaine (LIDODERM) 5 %   Transdermal   Place 1 patch onto the skin daily. Apply 1 patch to left lower back/hip every morning.  Remove & Discard patch within 12 hours or as directed by MD         . lisinopril (PRINIVIL,ZESTRIL) 40 MG tablet   Oral   Take 40 mg by mouth daily.         Marland Kitchen loratadine (CLARITIN) 10 MG tablet   Oral   Take 10 mg by mouth daily.         . metoCLOPramide (REGLAN) 5 MG tablet   Oral   Take 2.5 mg by mouth 4 (four) times daily -  before meals and at bedtime.         . Multiple Vitamins-Minerals (CERTAGEN PO)   Oral   Take 1 tablet by mouth daily.          . pantoprazole (PROTONIX) 40 MG tablet   Oral   Take 40 mg by mouth 2 (two) times daily.          Bertram Gala Glycol-Propyl Glycol (SYSTANE  OP)   Both Eyes   Place 1 drop into both eyes 4 (four) times daily.         . polyethylene glycol (MIRALAX / GLYCOLAX) packet   Oral   Take 17 g by mouth daily.         . potassium chloride SA (K-DUR,KLOR-CON) 20 MEQ tablet   Oral   Take 20 mEq by mouth daily.          . pravastatin (PRAVACHOL) 20 MG tablet   Oral   Take 20 mg by mouth at bedtime.          . sertraline (ZOLOFT) 50 MG tablet   Oral   Take 50 mg by mouth at bedtime.         .  sodium chloride 0.9 % SOLN 1,000 mL with promethazine 25 MG/ML SOLN 25 mg   Intravenous   Inject 12.5 mg into the vein every 6 (six) hours as needed (nausea and vomiting).         . Travoprost, BAK Free, (TRAVATAN) 0.004 % SOLN ophthalmic solution   Both Eyes   Place 2 drops into both eyes at bedtime.          . traZODone (DESYREL) 50 MG tablet   Oral   Take 100 mg by mouth at bedtime.          . vitamin B-12 (CYANOCOBALAMIN) 500 MCG tablet   Oral   Take 500 mcg by mouth daily.         . vitamin C (ASCORBIC ACID) 500 MG tablet   Oral   Take 500 mg by mouth daily.         Marland Kitchen amLODipine (NORVASC) 5 MG tablet   Oral   Take 5 mg by mouth daily.         . cloNIDine (CATAPRES) 0.1 MG tablet   Oral   Take 0.1 mg by mouth every 6 (six) hours as needed.         . diclofenac sodium (VOLTAREN) 1 % GEL   Topical   Apply 2 g topically 2 (two) times daily as needed.         . metFORMIN (GLUCOPHAGE) 1000 MG tablet   Oral   Take 1,000 mg by mouth 2 (two) times daily with a meal.         . ondansetron (ZOFRAN) 4 MG tablet   Oral   Take 4 mg by mouth every 8 (eight) hours as needed for nausea or vomiting.          BP 169/93  Pulse 96  Temp(Src) 99.3 F (37.4 C) (Rectal)  Resp 18  SpO2 95% Physical Exam  Nursing note and vitals reviewed. Constitutional: She appears well-developed and well-nourished. No distress.  HENT:  Head: Normocephalic and atraumatic.  Eyes: EOM are normal. Pupils are equal, round, and reactive to light.  Neck: Normal range of motion. Neck supple.  Cardiovascular: Normal rate and regular rhythm.  Exam reveals no friction rub.   No murmur heard. Pulmonary/Chest: Effort normal and breath sounds normal. No respiratory distress. She has no wheezes. She has no rales.  Abdominal: Soft. She exhibits distension. There is tenderness (diffuse). There is no rebound.  Musculoskeletal: Normal range of motion. She exhibits no edema.  Neurological: She  is alert.  Skin: She is not diaphoretic.    ED Course  Procedures (including critical care time) Labs Review Labs Reviewed  CBC  BASIC METABOLIC PANEL  URINALYSIS, ROUTINE W REFLEX  MICROSCOPIC  HEPATIC FUNCTION PANEL   Imaging Review Ct Abdomen Pelvis W Contrast  05/23/2013   CLINICAL DATA:  79 year old female with abdominal and pelvic pain. Nausea and vomiting.  EXAM: CT ABDOMEN AND PELVIS WITH CONTRAST  TECHNIQUE: Multidetector CT imaging of the abdomen and pelvis was performed using the standard protocol following bolus administration of intravenous contrast.  CONTRAST:  OMNIPAQUE IOHEXOL 300 MG/ML  SOLN  COMPARISON:  05/22/2011 CT and radiographs performed today.  FINDINGS: There is a volvulus of the mid-distal sigmoid colon causing a high-grade obstruction. There is a small amount of fluid within the rectum present.  There is no evidence of pneumoperitoneum. A trace amount of free pelvic fluid is noted.  Horseshoe kidney is again noted with nonobstructing left renal calculus.  Mild intrahepatic biliary fullness is identified.  The spleen and adrenal glands are unremarkable. A 1 x 2 cm hypodense lesion within the pancreatic body is unchanged. There is no evidence of distal pancreatic ductal dilatation.  There is no evidence of enlarged lymph nodes or abdominal aortic aneurysm.  No acute or suspicious bony abnormalities are identified. Left hip replacement is present.  Cardiomegaly and mild bibasilar atelectasis identified.  IMPRESSION: Mid-distal sigmoid colonic volvulus causing high-grade obstruction. No evidence of pneumoperitoneum.  Horseshoe kidney with nonobstructing left renal calculus.  Unchanged 1 x 2 cm pancreatic body lesion.  Cardiomegaly and mild bibasilar atelectasis.   Electronically Signed   By: Laveda Abbe M.D.   On: 05/23/2013 22:59   Dg Abd Acute W/chest  05/23/2013   CLINICAL DATA:  79 year old female with abdominal pain, distention and vomiting.  EXAM: ACUTE ABDOMEN  SERIES (ABDOMEN 2 VIEW & CHEST 1 VIEW)  COMPARISON:  04/19/2013 and prior radiographs dating back to 05/22/2011. 05/22/2011 CT  FINDINGS: Cardiomegaly noted.  Pulmonary vascular congestion is identified.  Patchy opacities/atelectasis within the left lung again noted.  Moderate to severe gaseous distention of the colon is noted. The sigmoid colon measures up to 13.6 cm. A sigmoid obstruction is not excluded although this bowel gas pattern has a somewhat similar appearance to 05/22/2011. .  There is no evidence of pneumoperitoneum.  Surgical clips within the right upper abdomen are noted.  A remote right clavicle fracture and left hip arthroplasty again identified.  IMPRESSION: Gaseous distention of the colon with the sigmoid colon measuring up to 13.6 cm. This could represent a sigmoid obstruction but has a somewhat similar appearance to 05/22/2011. CT may be helpful for further evaluation.  Cardiomegaly with pulmonary vascular congestion and chronic left lung opacities.   Electronically Signed   By: Laveda Abbe M.D.   On: 05/23/2013 21:22    EKG Interpretation   None       MDM   1. Sigmoid volvulus   2. Hypokalemia   3. Nausea and vomiting   4. UTI (urinary tract infection)    56F presents with abdominal pain. She's had vomiting and nausea with her abdominal pain for the past 48 hours. They sent her here for concerns of a "blockage". She has a history of A. fib, mild dementia, parkinsonism, coronary artery disease, diabetes. She has a history of cholecystectomy, hysterectomy, and esophageal dilation. He's coming by her family including her daughter. She is very hard of hearing so the her history is difficult. Her lungs are clear. She has a distended abdomen that is tympanic to percussion in all fields. Diffuse tenderness. Concern for bowel obstruction. Will start with an AAS and labs. Fluids, pain meds given.  AAS shows sigmoid volvulus vs obstruction. CT ordered. Labs show hypokalemia. IV potassium  initiated. UTI found, rocephin given. CT shows sigmoid volvulus. Surgery consulted, stated this is a GI consult. Dr. Rhea Belton with [facility] to see and plan for a unprepped Flexible Sigmoidoscopy.  Dagmar Hait, MD 05/23/13 [phone]

## 2013-05-23 NOTE — ED Notes (Signed)
Pt coming in from [facility] abd pain for 2 days now, n/v, denies any urinary sx

## 2013-05-24 ENCOUNTER — Inpatient Hospital Stay (HOSPITAL_COMMUNITY): Payer: PRIVATE HEALTH INSURANCE

## 2013-05-24 ENCOUNTER — Encounter (HOSPITAL_COMMUNITY): Payer: Self-pay | Admitting: Emergency Medicine

## 2013-05-24 ENCOUNTER — Encounter (HOSPITAL_COMMUNITY)
Admission: EM | Disposition: A | Discharge status: Skilled Nursing Facility | Payer: Self-pay | Source: Home / Self Care | Attending: Internal Medicine

## 2013-05-24 DIAGNOSIS — I5032 Chronic diastolic (congestive) heart failure: Secondary | ICD-10-CM

## 2013-05-24 DIAGNOSIS — G20 Parkinson's disease: Secondary | ICD-10-CM

## 2013-05-24 DIAGNOSIS — I509 Heart failure, unspecified: Secondary | ICD-10-CM

## 2013-05-24 DIAGNOSIS — L89109 Pressure ulcer of unspecified part of back, unspecified stage: Secondary | ICD-10-CM

## 2013-05-24 DIAGNOSIS — I10 Essential (primary) hypertension: Secondary | ICD-10-CM

## 2013-05-24 DIAGNOSIS — R141 Gas pain: Secondary | ICD-10-CM | POA: Diagnosis not present

## 2013-05-24 DIAGNOSIS — K562 Volvulus: Secondary | ICD-10-CM

## 2013-05-24 DIAGNOSIS — F3289 Other specified depressive episodes: Secondary | ICD-10-CM

## 2013-05-24 DIAGNOSIS — I272 Pulmonary hypertension, unspecified: Secondary | ICD-10-CM | POA: Diagnosis present

## 2013-05-24 DIAGNOSIS — L8995 Pressure ulcer of unspecified site, unstageable: Secondary | ICD-10-CM

## 2013-05-24 DIAGNOSIS — G20A1 Parkinson's disease without dyskinesia, without mention of fluctuations: Secondary | ICD-10-CM

## 2013-05-24 DIAGNOSIS — L89159 Pressure ulcer of sacral region, unspecified stage: Secondary | ICD-10-CM | POA: Diagnosis present

## 2013-05-24 DIAGNOSIS — H9190 Unspecified hearing loss, unspecified ear: Secondary | ICD-10-CM | POA: Diagnosis present

## 2013-05-24 DIAGNOSIS — N390 Urinary tract infection, site not specified: Secondary | ICD-10-CM

## 2013-05-24 DIAGNOSIS — E785 Hyperlipidemia, unspecified: Secondary | ICD-10-CM

## 2013-05-24 DIAGNOSIS — F329 Major depressive disorder, single episode, unspecified: Secondary | ICD-10-CM

## 2013-05-24 DIAGNOSIS — I2789 Other specified pulmonary heart diseases: Secondary | ICD-10-CM

## 2013-05-24 HISTORY — PX: FLEXIBLE SIGMOIDOSCOPY: SHX5431

## 2013-05-24 HISTORY — DX: Volvulus: K56.2

## 2013-05-24 LAB — URINALYSIS, ROUTINE W REFLEX MICROSCOPIC
Bilirubin Urine: NEGATIVE
Glucose, UA: >1000 mg/dL — AB
Ketones, ur: NEGATIVE mg/dL
Nitrite: NEGATIVE
Protein, ur: NEGATIVE mg/dL
pH: 6 (ref 5.0–8.0)

## 2013-05-24 LAB — MAGNESIUM: Magnesium: 1.8 mg/dL (ref 1.5–2.5)

## 2013-05-24 LAB — CBC WITH DIFFERENTIAL/PLATELET
Basophils Relative: 0 % (ref 0–1)
Eosinophils Absolute: 0.1 10*3/uL (ref 0.0–0.7)
Eosinophils Relative: 1 % (ref 0–5)
HCT: 26.2 % — ABNORMAL LOW (ref 36.0–46.0)
Hemoglobin: 8.7 g/dL — ABNORMAL LOW (ref 12.0–15.0)
Lymphs Abs: 1.1 10*3/uL (ref 0.7–4.0)
MCHC: 33.2 g/dL (ref 30.0–36.0)
MCV: 100 fL (ref 78.0–100.0)
Monocytes Relative: 12 % (ref 3–12)
Neutro Abs: 4.3 10*3/uL (ref 1.7–7.7)
Platelets: 103 10*3/uL — ABNORMAL LOW (ref 150–400)
RBC: 2.62 MIL/uL — ABNORMAL LOW (ref 3.87–5.11)
WBC: 6.2 10*3/uL (ref 4.0–10.5)

## 2013-05-24 LAB — GLUCOSE, CAPILLARY
Glucose-Capillary: 83 mg/dL (ref 70–99)
Glucose-Capillary: 105 mg/dL — ABNORMAL HIGH (ref 70–99)

## 2013-05-24 LAB — COMPREHENSIVE METABOLIC PANEL
AST: 14 U/L (ref 0–37)
Albumin: 3.1 g/dL — ABNORMAL LOW (ref 3.5–5.2)
CO2: 24 mEq/L (ref 19–32)
Calcium: 8.4 mg/dL (ref 8.4–10.5)
Creatinine, Ser: 0.81 mg/dL (ref 0.50–1.10)
GFR calc non Af Amer: 64 mL/min — ABNORMAL LOW (ref >90)
Total Protein: 5.9 g/dL — ABNORMAL LOW (ref 6.0–8.3)

## 2013-05-24 LAB — URINE MICROSCOPIC-ADD ON

## 2013-05-24 LAB — BASIC METABOLIC PANEL
BUN: 9 mg/dL (ref 6–23)
CO2: 27 mEq/L (ref 19–32)
Calcium: 8.6 mg/dL (ref 8.4–10.5)
Chloride: 111 mEq/L (ref 96–112)
Creatinine, Ser: 0.85 mg/dL (ref 0.50–1.10)
GFR calc non Af Amer: 60 mL/min — ABNORMAL LOW (ref >90)
Glucose, Bld: 86 mg/dL (ref 70–99)
Sodium: 145 mEq/L (ref 135–145)

## 2013-05-24 LAB — POTASSIUM: Potassium: 2.3 mEq/L — CL (ref 3.5–5.1)

## 2013-05-24 LAB — PRO B NATRIURETIC PEPTIDE: Pro B Natriuretic peptide (BNP): 3915 pg/mL — ABNORMAL HIGH (ref 0–450)

## 2013-05-24 LAB — HEMOGLOBIN A1C: Mean Plasma Glucose: 137 mg/dL — ABNORMAL HIGH (ref <117)

## 2013-05-24 SURGERY — SIGMOIDOSCOPY, FLEXIBLE
Anesthesia: Moderate Sedation

## 2013-05-24 MED ORDER — MENTHOL 3 MG MT LOZG
1.0000 | LOZENGE | OROMUCOSAL | Status: DC | PRN
  Filled 2013-05-24: qty 9

## 2013-05-24 MED ORDER — KETOTIFEN FUMARATE 0.025 % OP SOLN
1.0000 [drp] | Freq: Two times a day (BID) | OPHTHALMIC | Status: DC
  Administered 2013-05-24 – 2013-06-02 (×19): 1 [drp] via OPHTHALMIC
  Filled 2013-05-24: qty 5

## 2013-05-24 MED ORDER — METRONIDAZOLE IN NACL 5-0.79 MG/ML-% IV SOLN
500.0000 mg | Freq: Four times a day (QID) | INTRAVENOUS | Status: DC
  Administered 2013-05-24 – 2013-05-25 (×4): 500 mg via INTRAVENOUS
  Filled 2013-05-24 (×6): qty 100

## 2013-05-24 MED ORDER — SODIUM CHLORIDE 0.9 % IV SOLN
INTRAVENOUS | Status: DC
  Administered 2013-05-24 – 2013-05-29 (×7): via INTRAVENOUS

## 2013-05-24 MED ORDER — ONDANSETRON HCL 4 MG PO TABS: 4.0000 mg | ORAL_TABLET | Freq: Four times a day (QID) | ORAL | Status: DC | PRN

## 2013-05-24 MED ORDER — INSULIN ASPART 100 UNIT/ML ~~LOC~~ SOLN
0.0000 [IU] | SUBCUTANEOUS | Status: DC
  Administered 2013-05-24 – 2013-05-25 (×4): 2 [IU] via SUBCUTANEOUS
  Administered 2013-05-26: 5 [IU] via SUBCUTANEOUS
  Administered 2013-05-26 – 2013-05-27 (×3): 3 [IU] via SUBCUTANEOUS
  Administered 2013-05-27: 5 [IU] via SUBCUTANEOUS
  Administered 2013-05-27 – 2013-05-28 (×4): 2 [IU] via SUBCUTANEOUS
  Administered 2013-05-29 (×3): 3 [IU] via SUBCUTANEOUS
  Administered 2013-05-30: 20:00:00 via SUBCUTANEOUS
  Administered 2013-05-30 (×2): 2 [IU] via SUBCUTANEOUS
  Administered 2013-05-30: 3 [IU] via SUBCUTANEOUS
  Administered 2013-05-31: 2 [IU] via SUBCUTANEOUS
  Administered 2013-05-31 (×2): 3 [IU] via SUBCUTANEOUS
  Administered 2013-05-31 – 2013-06-01 (×2): 2 [IU] via SUBCUTANEOUS
  Administered 2013-06-01 (×2): 3 [IU] via SUBCUTANEOUS
  Administered 2013-06-01: 2 [IU] via SUBCUTANEOUS
  Administered 2013-06-02: 3 [IU] via SUBCUTANEOUS
  Administered 2013-06-02 (×3): 2 [IU] via SUBCUTANEOUS

## 2013-05-24 MED ORDER — BISACODYL 10 MG RE SUPP: 10.0000 mg | Freq: Two times a day (BID) | RECTAL | Status: DC | PRN

## 2013-05-24 MED ORDER — FENTANYL CITRATE 0.05 MG/ML IJ SOLN
INTRAMUSCULAR | Status: AC
  Filled 2013-05-24: qty 2

## 2013-05-24 MED ORDER — POTASSIUM CHLORIDE 10 MEQ/100ML IV SOLN
10.0000 meq | Freq: Every day | INTRAVENOUS | Status: DC
  Administered 2013-05-24: 10 meq via INTRAVENOUS
  Filled 2013-05-24 (×4): qty 100

## 2013-05-24 MED ORDER — LATANOPROST 0.005 % OP SOLN
1.0000 [drp] | Freq: Every day | OPHTHALMIC | Status: DC
  Administered 2013-05-24 – 2013-06-01 (×9): 1 [drp] via OPHTHALMIC
  Filled 2013-05-24: qty 2.5

## 2013-05-24 MED ORDER — LACTATED RINGERS IV BOLUS (SEPSIS): 1000.0000 mL | Freq: Three times a day (TID) | INTRAVENOUS | Status: AC | PRN

## 2013-05-24 MED ORDER — DEXTROSE 5 % IV SOLN
1.0000 g | INTRAVENOUS | Status: DC
  Administered 2013-05-25: 04:00:00 1 g via INTRAVENOUS
  Filled 2013-05-24: qty 10

## 2013-05-24 MED ORDER — MIDAZOLAM HCL 10 MG/2ML IJ SOLN
INTRAMUSCULAR | Status: DC | PRN
  Administered 2013-05-24: 2 mg via INTRAVENOUS
  Administered 2013-05-24: 1 mg via INTRAVENOUS

## 2013-05-24 MED ORDER — MIDAZOLAM HCL 10 MG/2ML IJ SOLN
INTRAMUSCULAR | Status: AC
  Filled 2013-05-24: qty 2

## 2013-05-24 MED ORDER — FENTANYL CITRATE 0.05 MG/ML IJ SOLN
INTRAMUSCULAR | Status: DC | PRN
  Administered 2013-05-24 (×2): 25 ug via INTRAVENOUS

## 2013-05-24 MED ORDER — DICLOFENAC SODIUM 1 % TD GEL
2.0000 g | Freq: Two times a day (BID) | TRANSDERMAL | Status: DC | PRN
  Filled 2013-05-24: qty 100

## 2013-05-24 MED ORDER — ENOXAPARIN SODIUM 40 MG/0.4ML ~~LOC~~ SOLN
40.0000 mg | SUBCUTANEOUS | Status: DC
  Administered 2013-05-24 – 2013-05-25 (×2): 40 mg via SUBCUTANEOUS
  Filled 2013-05-24 (×2): qty 0.4

## 2013-05-24 MED ORDER — LIP MEDEX EX OINT
1.0000 "application " | TOPICAL_OINTMENT | Freq: Two times a day (BID) | CUTANEOUS | Status: DC
  Administered 2013-05-24 – 2013-06-02 (×19): 1 via TOPICAL
  Filled 2013-05-24 (×2): qty 7

## 2013-05-24 MED ORDER — PHENOL 1.4 % MT LIQD
1.0000 | OROMUCOSAL | Status: DC | PRN
  Filled 2013-05-24: qty 177

## 2013-05-24 MED ORDER — ONDANSETRON HCL 4 MG/2ML IJ SOLN: 4.0000 mg | Freq: Four times a day (QID) | INTRAMUSCULAR | Status: DC | PRN

## 2013-05-24 MED ORDER — ASPIRIN EC 81 MG PO TBEC
81.0000 mg | DELAYED_RELEASE_TABLET | Freq: Every day | ORAL | Status: DC
  Administered 2013-05-25 – 2013-06-02 (×9): 81 mg via ORAL
  Filled 2013-05-24 (×10): qty 1

## 2013-05-24 MED ORDER — POTASSIUM CHLORIDE 20 MEQ/15ML (10%) PO LIQD
40.0000 meq | ORAL | Status: AC
  Administered 2013-05-24 (×2): 40 meq via ORAL
  Filled 2013-05-24 (×2): qty 30

## 2013-05-24 MED ORDER — ALUM & MAG HYDROXIDE-SIMETH 200-200-20 MG/5ML PO SUSP: 30.0000 mL | Freq: Four times a day (QID) | ORAL | Status: DC | PRN

## 2013-05-24 MED ORDER — MORPHINE SULFATE 2 MG/ML IJ SOLN
1.0000 mg | INTRAMUSCULAR | Status: DC | PRN
  Administered 2013-05-25: 2 mg via INTRAVENOUS
  Administered 2013-05-25: 1 mg via INTRAVENOUS
  Filled 2013-05-24 (×2): qty 1

## 2013-05-24 MED ORDER — SODIUM CHLORIDE 0.9 % IV SOLN
INTRAVENOUS | Status: DC
  Administered 2013-05-24: 03:00:00 via INTRAVENOUS

## 2013-05-24 MED ORDER — DEXTROSE 5 % IV SOLN: 1.0000 g | INTRAVENOUS | Status: DC

## 2013-05-24 MED ORDER — PANTOPRAZOLE SODIUM 40 MG IV SOLR
40.0000 mg | Freq: Two times a day (BID) | INTRAVENOUS | Status: DC
  Administered 2013-05-24 (×3): 40 mg via INTRAVENOUS
  Filled 2013-05-24 (×5): qty 40

## 2013-05-24 MED ORDER — DORZOLAMIDE HCL 2 % OP SOLN
1.0000 [drp] | Freq: Three times a day (TID) | OPHTHALMIC | Status: DC
Start: 2013-05-24 — End: 2013-06-02
  Administered 2013-05-24 – 2013-06-02 (×29): 1 [drp] via OPHTHALMIC
  Filled 2013-05-24: qty 10

## 2013-05-24 MED ORDER — MAGIC MOUTHWASH
15.0000 mL | Freq: Four times a day (QID) | ORAL | Status: DC | PRN
  Filled 2013-05-24: qty 15

## 2013-05-24 MED ORDER — METOPROLOL TARTRATE 1 MG/ML IV SOLN
5.0000 mg | Freq: Three times a day (TID) | INTRAVENOUS | Status: DC
  Administered 2013-05-24 – 2013-05-25 (×3): 5 mg via INTRAVENOUS
  Filled 2013-05-24 (×7): qty 5

## 2013-05-24 NOTE — Progress Notes (Signed)
ABDOMEN - 2 VIEW, supine and left side down decubitus film  COMPARISON: CT scan of the abdomen and pelvis dated May 23, 2013.  FINDINGS:  There remains a loop of moderately distended gas-filled of bowel in  the mid to lower abdomen and upper pelvis consistent with clinically  suspected sigmoid volvulus. Proximal to this the colon does not  appear abnormally distended. There is contrast within the urinary  bladder.  The esophagogastric tube tip is at the level of the GE junction with  the proximal port is above the GE junction in the distal thoracic  esophagus. Advancement by at least 20 cm is recommended. There are  surgical clips in the gallbladder fossa. There is a prosthetic left  hip joint.  IMPRESSION:  1. The esophagogastric tube position is inadequate and advancement  by 20 cm is recommended to assure that the proximal port lies below  the GE junction.  2. There remains mild gaseous distention of a curvilinear loop of  bowel in the lower abdomen and upper pelvis consistent with the  clinically suspected sigmoid volvulus. The degree of colonic  distention with gas and stool appears somewhat less on today's  abdominal series when compared to the earlier CT scan.   Patient seen, examined, and I agree with the above documentation, including the assessment and plan. Pt with improvement in abdominal distention, and 3 large nonbloody greenish brown bowel movements over the course of the day.  Bowel movements indicative of reduction/decompression of volvulus after flexible sigmoidoscopy last night Remains at risk to recur, but at present I do not think it has recurred NG tube has no output and is not in appropriate position, will remove.  Clamp trial the last 5 hours and patient continues to tolerate clear liquids without nausea or vomiting Potassium replacement per primary team

## 2013-05-24 NOTE — ED Notes (Signed)
Patient back from endo.

## 2013-05-24 NOTE — ED Notes (Signed)
Pt. CBG 144. RN, Gaynelle Cage made aware.

## 2013-05-24 NOTE — Consult Note (Signed)
Reason for Consult:sigmoid volvulus Referring Physician: Meredith Staggers is an 79 y.o. Christian.  HPI: patient is an 79 year old Christian, nursing home resident, brought to the emergency room due to persistent vomiting. The patient has mild dementia and is hard of hearing and is not able to provide much history. Most of the history is obtained from her daughters. She has a long history of chronic constipation. She has no previous history of bowel obstruction. She was noted at the nursing home to have at least 2 days of recurrent nausea and vomiting and abdominal distention. She has been having small loose bowel movements. No apparent blood in her bowel movements. The patient has multiple medical problems chronically as noted below and is nonambulatory in a nursing home. She is able to deny abdominal pain currently.  Past Medical History  Diagnosis Date  . Atrial fibrillation   . Altered mental status   . Encephalopathy   . Parkinson disease   . Hypertension   . GERD (gastroesophageal reflux disease)   . Hyperlipemia   . Diabetes mellitus   . Coronary artery disease   . History of adenomatous polyp of colon 06/24/99  . Enlarged heart   . DEMENTIA   . Edema of lower extremity 2/Maria/13    right leg more swollen than left leg  . Esophageal dysmotility 07/02/12  . Horseshoe kidney   . Pancreatic lesion 05/22/11    no further workup  per PCP/family due to age  . Anemia   . Peripheral neuropathy   . Depression   . Thrombophlebitis     Past Surgical History  Procedure Laterality Date  . Shoulder surgery  2001    left clavicle excision and acromioplasty  . Abdominal hysterectomy    . Cholecystectomy    . Total hip arthroplasty    . Breast surgery      2 benign tumors removed left breast  . Foot surgery      benign tumors from foot  . Esophageal dilation      several times by Dr. Victorino Dike  . Nose surgery    . Esophagogastroduodenoscopy (egd) with esophageal dilation N/A  08/01/2012    Procedure: ESOPHAGOGASTRODUODENOSCOPY (EGD) WITH ESOPHAGEAL DILATION;  Surgeon: Hart Carwin, MD;  Location: [facility];  Service: Endoscopy;  Laterality: N/A;  with c-arm savory dilators    Family History  Problem Relation Age of Onset  . Cancer    . Heart disease      Social History:  reports that she has quit smoking. She has never used smokeless tobacco. She reports that she does not drink alcohol or use illicit drugs.  Allergies: No Known Allergies  Current Facility-Administered Medications  Medication Dose Route Frequency Provider Last Rate Last Dose  . cefTRIAXone (ROCEPHIN) 1 g in dextrose 5 % 50 mL IVPB  1 g Intravenous Once Dagmar Hait, MD      . potassium chloride 10 mEq in 100 mL IVPB  10 mEq Intravenous Q1 Hr x 6 Dagmar Hait, MD 100 mL/hr at 05/23/13 2238 10 mEq at 05/23/13 2238   Current Outpatient Prescriptions  Medication Sig Dispense Refill  . acetaminophen (TYLENOL) 325 MG tablet Take 650 mg by mouth 2 (two) times daily. For OA.      Marland Kitchen ALPRAZolam (XANAX) 0.25 MG tablet Take 0.25 mg by mouth 2 (two) times daily as needed for anxiety. Take 1 tablet by mouth daily at 2 pm for anxiety.      Marland Kitchen  ALPRAZolam (XANAX) 0.25 MG tablet Take 0.25 mg by mouth at bedtime as needed for anxiety.      . Alum & Mag Hydroxide-Simeth (MAGIC MOUTHWASH) SOLN Take 10 mLs by mouth as needed for mouth pain.      Marland Kitchen amLODipine (NORVASC) 5 MG tablet Take 5 mg by mouth daily.      Marland Kitchen aspirin 81 MG chewable tablet Chew 81 mg by mouth daily.      . cefTRIAXone 1 g/4 mLs in lidocaine (with preservative) injection Inject 1 g into the muscle once.      . cholecalciferol (VITAMIN D) 1000 UNITS tablet Take 1,000 Units by mouth daily.      . cloNIDine (CATAPRES) 0.1 MG tablet Take 0.1 mg by mouth every 6 (six) hours as needed (SBP greater than 160).       . clopidogrel (PLAVIX) 75 MG tablet Take 75 mg by mouth daily.       . Cranberry 475 MG CAPS Take 1 capsule by mouth 2  (two) times daily.      Marland Kitchen dextromethorphan-guaiFENesin (MUCINEX DM) 30-600 MG per 12 hr tablet Take 1 tablet by mouth every 12 (twelve) hours.      . diclofenac sodium (VOLTAREN) 1 % GEL Apply 2 g topically 2 (two) times daily as needed (left neck pain).       Marland Kitchen donepezil (ARICEPT) 5 MG tablet Take 5 mg by mouth at bedtime.      . dorzolamide (TRUSOPT) 2 % ophthalmic solution Place 1 drop into both eyes 3 (three) times daily.       . furosemide (LASIX) 40 MG tablet Take 40 mg by mouth daily.      Marland Kitchen gabapentin (NEURONTIN) 300 MG capsule Take 300 mg by mouth 2 (two) times daily.      Marland Kitchen ibuprofen (ADVIL,MOTRIN) 400 MG tablet Take 400 mg by mouth daily at 12 noon.       Marland Kitchen ketotifen (ZADITOR) 0.025 % ophthalmic solution Place 1 drop into both eyes 2 (two) times daily.      Marland Kitchen lactulose (CHRONULAC) 10 GM/15ML solution Take 20 g by mouth 2 (two) times daily.      Marland Kitchen lidocaine (LIDODERM) 5 % Place 1 patch onto the skin daily. Apply 1 patch to left lower back/hip every morning.  Remove & Discard patch within 12 hours or as directed by MD      . lisinopril (PRINIVIL,ZESTRIL) 40 MG tablet Take 40 mg by mouth daily.      Marland Kitchen loratadine (CLARITIN) 10 MG tablet Take 10 mg by mouth daily.      . magnesium citrate SOLN Take 0.5 Bottles by mouth 2 (two) times daily. For 1 day.      . magnesium hydroxide (MILK OF MAGNESIA) 400 MG/5ML suspension Take 30 mLs by mouth daily as needed for mild constipation.      . metFORMIN (GLUCOPHAGE) 1000 MG tablet Take 1,000 mg by mouth 2 (two) times daily with a meal.      . metoCLOPramide (REGLAN) 5 MG tablet Take 2.5 mg by mouth 4 (four) times daily -  before meals and at bedtime.      . Multiple Vitamins-Minerals (CERTAGEN PO) Take 1 tablet by mouth daily.       . ondansetron (ZOFRAN) 4 MG tablet Take 4 mg by mouth every 8 (eight) hours as needed for nausea or vomiting.      . pantoprazole (PROTONIX) 40 MG tablet Take 40 mg by mouth 2 (two) times  daily.       . Polyethyl  Glycol-Propyl Glycol (SYSTANE OP) Place 1 drop into both eyes 4 (four) times daily.      . polyethylene glycol (MIRALAX / GLYCOLAX) packet Take 17 g by mouth daily.      . potassium chloride SA (K-DUR,KLOR-CON) 20 MEQ tablet Take 20 mEq by mouth daily.       . pravastatin (PRAVACHOL) 20 MG tablet Take 20 mg by mouth at bedtime.       . promethazine (PHENERGAN) 25 MG/ML injection Inject 12.5 mg into the vein once.      . sertraline (ZOLOFT) 50 MG tablet Take 50 mg by mouth at bedtime.      . sodium chloride 0.9 % SOLN 1,000 mL with promethazine 25 MG/ML SOLN 25 mg Inject 12.5 mg into the vein every 6 (six) hours as needed (nausea and vomiting).      . Travoprost, BAK Free, (TRAVATAN) 0.004 % SOLN ophthalmic solution Place 2 drops into both eyes at bedtime.       . traZODone (DESYREL) 50 MG tablet Take 100 mg by mouth at bedtime.       . vitamin B-12 (CYANOCOBALAMIN) 500 MCG tablet Take 500 mcg by mouth daily.      . vitamin C (ASCORBIC ACID) 500 MG tablet Take 500 mg by mouth daily.      . fluconazole (DIFLUCAN) 100 MG tablet Take 100 mg by mouth daily. For Maria days.        Results for orders placed during the hospital encounter of 05/23/13 (from the past 48 hour(s))  CBC     Status: Abnormal   Collection Time    05/23/13  Maria:10 PM      Result Value Range   WBC 6.5  4.0 - 10.5 K/uL   RBC 3.07 (*) 3.87 - 5.11 MIL/uL   Hemoglobin 10.1 (*) 12.0 - 15.0 g/dL   HCT 81.1 (*) 91.4 - 78.2 %   MCV 99.Maria  78.0 - 100.0 fL   MCH 32.9  26.0 - 34.0 pg   MCHC 33.0  30.0 - 36.0 g/dL   RDW 95.6  21.3 - 08.6 %   Platelets 132 (*) 150 - 400 K/uL  BASIC METABOLIC PANEL     Status: Abnormal   Collection Time    05/23/13  Maria:10 PM      Result Value Range   Sodium 146 (*) 135 - 145 mEq/L   Potassium 2.5 (*) 3.5 - 5.1 mEq/L   Comment: CRITICAL RESULT CALLED TO, READ BACK BY AND VERIFIED WITH:     L ADKINS RN 2128 05/23/13 A NAVARRO   Chloride 107  96 - 112 mEq/L   CO2 25  19 - 32 mEq/L   Glucose, Bld 164 (*)  70 - 99 mg/dL   BUN 9  6 - 23 mg/dL   Creatinine, Ser 5.78  0.50 - 1.10 mg/dL   Calcium 9.4  8.4 - 46.9 mg/dL   GFR calc non Af Amer 73 (*) >90 mL/min   GFR calc Af Amer 85 (*) >90 mL/min   Comment: (NOTE)     The eGFR has been calculated using the CKD EPI equation.     This calculation has not been validated in all clinical situations.     eGFR's persistently <90 mL/min signify possible Chronic Kidney     Disease.  HEPATIC FUNCTION PANEL     Status: None   Collection Time    05/23/13  Maria:10  PM      Result Value Range   Total Protein Maria.0  6.0 - 8.3 g/dL   Albumin 3.8  3.5 - 5.2 g/dL   AST 18  0 - 37 U/L   ALT 8  0 - 35 U/L   Alkaline Phosphatase 63  39 - 117 U/L   Total Bilirubin 0.3  0.3 - 1.2 mg/dL   Bilirubin, Direct <9.1  0.0 - 0.3 mg/dL   Indirect Bilirubin NOT CALCULATED  0.3 - 0.9 mg/dL  CG4 I-STAT (LACTIC ACID)     Status: None   Collection Time    05/23/13  8:31 PM      Result Value Range   Lactic Acid, Venous 0.85  0.5 - 2.2 mmol/L  URINALYSIS, ROUTINE W REFLEX MICROSCOPIC     Status: Abnormal   Collection Time    05/23/13  9:20 PM      Result Value Range   Color, Urine YELLOW  YELLOW   APPearance TURBID (*) CLEAR   Specific Gravity, Urine 1.016  1.005 - 1.030   pH 5.5  5.0 - 8.0   Glucose, UA NEGATIVE  NEGATIVE mg/dL   Hgb urine dipstick MODERATE (*) NEGATIVE   Bilirubin Urine NEGATIVE  NEGATIVE   Ketones, ur NEGATIVE  NEGATIVE mg/dL   Protein, ur 478 (*) NEGATIVE mg/dL   Urobilinogen, UA 0.2  0.0 - 1.0 mg/dL   Nitrite NEGATIVE  NEGATIVE   Leukocytes, UA LARGE (*) NEGATIVE  URINE MICROSCOPIC-ADD ON     Status: Abnormal   Collection Time    05/23/13  9:20 PM      Result Value Range   Squamous Epithelial / LPF FEW (*) RARE   WBC, UA TOO NUMEROUS TO COUNT  <3 WBC/hpf   RBC / HPF Maria-10  <3 RBC/hpf   Bacteria, UA MANY (*) RARE    Ct Abdomen Pelvis W Contrast  05/23/2013   CLINICAL DATA:  79 year old Christian with abdominal and pelvic pain. Nausea and  vomiting.  EXAM: CT ABDOMEN AND PELVIS WITH CONTRAST  TECHNIQUE: Multidetector CT imaging of the abdomen and pelvis was performed using the standard protocol following bolus administration of intravenous contrast.  CONTRAST:  OMNIPAQUE IOHEXOL 300 MG/ML  SOLN  COMPARISON:  05/22/2011 CT and radiographs performed today.  FINDINGS: There is a volvulus of the mid-distal sigmoid colon causing a high-grade obstruction. There is a small amount of fluid within the rectum present.  There is no evidence of pneumoperitoneum. A trace amount of free pelvic fluid is noted.  Horseshoe kidney is again noted with nonobstructing left renal calculus.  Mild intrahepatic biliary fullness is identified.  The spleen and adrenal glands are unremarkable. A 1 x 2 cm hypodense lesion within the pancreatic body is unchanged. There is no evidence of distal pancreatic ductal dilatation.  There is no evidence of enlarged lymph nodes or abdominal aortic aneurysm.  No acute or suspicious bony abnormalities are identified. Left hip replacement is present.  Cardiomegaly and mild bibasilar atelectasis identified.  IMPRESSION: Mid-distal sigmoid colonic volvulus causing high-grade obstruction. No evidence of pneumoperitoneum.  Horseshoe kidney with nonobstructing left renal calculus.  Unchanged 1 x 2 cm pancreatic body lesion.  Cardiomegaly and mild bibasilar atelectasis.   Electronically Signed   By: Laveda Abbe M.D.   On: 05/23/2013 22:59   Dg Abd Acute W/chest  05/23/2013   CLINICAL DATA:  79 year old Christian with abdominal pain, distention and vomiting.  EXAM: ACUTE ABDOMEN SERIES (ABDOMEN 2 VIEW & CHEST 1  VIEW)  COMPARISON:  04/19/2013 and prior radiographs dating back to 05/22/2011. 05/22/2011 CT  FINDINGS: Cardiomegaly noted.  Pulmonary vascular congestion is identified.  Patchy opacities/atelectasis within the left lung again noted.  Moderate to severe gaseous distention of the colon is noted. The sigmoid colon measures up to 13.6 cm. A  sigmoid obstruction is not excluded although this bowel gas pattern has a somewhat similar appearance to 05/22/2011. .  There is no evidence of pneumoperitoneum.  Surgical clips within the right upper abdomen are noted.  A remote right clavicle fracture and left hip arthroplasty again identified.  IMPRESSION: Gaseous distention of the colon with the sigmoid colon measuring up to 13.6 cm. This could represent a sigmoid obstruction but has a somewhat similar appearance to 05/22/2011. CT may be helpful for further evaluation.  Cardiomegaly with pulmonary vascular congestion and chronic left lung opacities.   Electronically Signed   By: Laveda Abbe M.D.   On: 05/23/2013 21:22    Review of Systems  Constitutional: Positive for fever and chills.  HENT: Positive for hearing loss.   Respiratory: Negative for cough, shortness of breath and wheezing.   Cardiovascular: Positive for palpitations and leg swelling.  Gastrointestinal: Positive for nausea, vomiting and constipation. Negative for abdominal pain and blood in stool.  Genitourinary: Positive for urgency.  Musculoskeletal: Positive for joint pain and myalgias.  Endo/Heme/Allergies: Bruises/bleeds easily.  Psychiatric/Behavioral: Positive for memory loss.   Blood pressure 131/58, pulse 83, temperature 98.8 F (37.1 C), temperature source Oral, resp. rate 17, SpO2 95.00%. Physical Exam General: Elderly Caucasian Christian who is alert and responsive but mildly confused in communication difficult is extremely hard of hearing Skin: Somewhat atrophic with scattered ecchymoses HEENT: No palpable masses or thyromegaly. Sclera nonicteric. Edentulous. Oropharynx clear. Her heart of hearing. Lungs: Breath sounds are clear bilaterally without increased work of breathing Cardiac: Irregular rhythm. No murmurs noted. 1+ lower extremity edema somewhat greater on the right than the left. Abdomen: Very distended and tympanitic. Nontender. No guarding or peritoneal signs.  No hernias or masses detectable. Extremities: One plus lower extremity edema. No gross deformity. No cyanosis Neurologic: She is alert and oriented to person only. No gross motor deficits.  Assessment/Plan: I personally reviewed the imaging and there is clear evidence of sigmoid volvulus with high-grade obstruction. No evidence of perforation or inflammatory change. I discussed with the EDP obtaining an immediate GI consultation for a flexible sigmoidoscopy and attempt at decompression. The patient would be very high risk for emergency or elective surgery. The family states they would want surgery if there were absolutely necessary. Hopefully she can be decompressed endoscopically. At her age with multiple significant medical problems I do not believe that she would be a good risk for elective surgery if she is able to be decompressed. We will follow.  Dusti Tetro T 05/24/2013, 12:09 AM

## 2013-05-24 NOTE — ED Notes (Signed)
Patient is sleepy but easy to arouse. Family at the bedside.

## 2013-05-24 NOTE — Op Note (Signed)
[facility] [address] [place], [address]   FLEXIBLE SIGMOIDOSCOPY PROCEDURE REPORT  PATIENT: Maria Christian, Maria Christian  MR#: [id number] BIRTHDATE: 1925/03/07 , 88  yrs. old GENDER: Female ENDOSCOPIST: Beverley Fiedler, MD REFERRED BY: Glenna Fellows, M.D. PROCEDURE DATE:  05/24/2013 PROCEDURE:   Sigmoidoscopy, diagnostic; Sigmoidoscopy for decompression of volvulus ASA CLASS:   Class III INDICATIONS:acute sigmoid volvulus with high-grade colonic obstruction. MEDICATIONS: These medications were titrated to patient response per physician's verbal order, Fentanyl 50 mcg IV, and Versed 3 mg IV  DESCRIPTION OF PROCEDURE:   After the risks benefits and alternatives of the procedure were thoroughly explained, informed consent was obtained.  revealed no abnormalities of the rectum. The Pentax pediatric colonoscope was introduced through the anus  and advanced to the descending colon, without limitation. No adverse events experienced.   The quality of the prep = unprepped .  The instrument was then slowly withdrawn as the mucosa was fully examined.    COLON FINDINGS: Evidence of a distal sigmoid volvulus was found. Using minimal to no air insufflation and water irrigation with minimal pressure the volvulized segment was able to be traversed with the pediatric colonoscope.  There was a focal area of erythema in the volvulized segment which was mild and patchy.  No frank necrosis or significant ischemia seen.  The sigmoid colon proximal to the volvulized segment was severely dilated and able to be decompressed completely. The scope was advanced to the distal descending colon where solid stool was encountered.  Air was removed as much as possible as the scope was withdrawn.  No masses seen but views in the sigmoid and descending colon were severely limited by the presence of stool.  Retroflexion was not performed. The scope was then withdrawn from the patient and the  procedure terminated.  COMPLICATIONS: There were no complications.  ENDOSCOPIC IMPRESSION: 1.  Evidence of sigmoid volvulus with focal erythema without frank ischemia/necrosis; successful decompression 2.  Dilated sigmoid and descending colon, decompression successful. Views of sigmoid and descending colonic mucosa severely limited by stool  RECOMMENDATIONS: 1.  Admit to the hospital for observation, IV fluids, supportive care 2.  GI will follow along with General Surgery 3.  Monitor for recurrent volvulus  eSigned:  Beverley Fiedler, MD 05/24/2013 1:47 AM CC:The Patient

## 2013-05-24 NOTE — H&P (Signed)
[facility] History and Physical  Maria Christian MWU:[id number] DOB: 04/21/1925 DOA: 05/23/2013  Referring physician:  PCP: Karlene Einstein, MD  Specialists:  Chief Complaint: Abdominal pain, nausea  HPI: Maria Christian is a 79 y.o. WF PMHx depression, dementia diastolic CHF, a fibrillation, CAD , HTN, HLD, diabetes type 2, Parkinson's disease,Hx TIA, thrombocytopenia, S/P cholecystectomy,S/P hysterectomy, S/P esophageal dilation. Presented to ED with vomiting and nausea with her abdominal pain for the past 2 weeks per her daughter's. Per daughter patient uses wheelchair to ambulate around, and they are also concerned about a sacral decubitus which has not been addressed. They sent her here for concerns of a "blockage". She is very hard of hearing so the her history is difficult. Her lungs are clear. She has a distended abdomen that is tympanic to percussion in all fields. Diffuse tenderness. Concern for bowel obstruction. AAS showed sigmoid volvulus vs obstruction. CT ordered.CT shows sigmoid volvulus. Surgery consulted, stated this is a GI consult. Dr. Rhea Belton with [facility] to see and plan for a unprepped Flexible Sigmoidoscopy. In addition Labs showed hypokalemia. IV potassium initiated. UTI found, rocephin given.     Review of Systems: The patient denies anorexia, fever, weight loss,, vision loss, decreased hearing, hoarseness, chest pain, syncope, dyspnea on exertion, peripheral edema, balance deficits, hemoptysis,  melena, hematochezia, severe indigestion/heartburn, hematuria, incontinence, genital sores, muscle weakness, suspicious skin lesions, transient blindness, difficulty walking, depression, unusual weight change, abnormal bleeding, enlarged lymph nodes, angioedema, and breast masses.    TRAVEL HISTORY: none   Procedure Sigmoid volvulus Dr. Erick Blinks (gastroenterology) performed a emergent flexible sigmoidoscopy for decompression   CT abdomen and pelvis with contrast  05/23/2013 Mid-distal sigmoid colonic volvulus causing high-grade obstruction.  No evidence of pneumoperitoneum.  Horseshoe kidney with nonobstructing left renal calculus.  Unchanged 1 x 2 cm pancreatic body lesion.  Cardiomegaly and mild bibasilar atelectasis.  Acute abdominal series with chest 12/19/214 Gaseous distention of the colon with the sigmoid colon measuring up  to 13.6 cm. This could represent a sigmoid obstruction but has a  somewhat similar appearance to 05/22/2011. CT may be helpful for  further evaluation.  Cardiomegaly with pulmonary vascular congestion and chronic left  lung opacities.  Echocardiogram 05/23/2011 - Left ventricle: The cavity size was normal. Wall thickness was normal. Systolic function was normal.  -LVEF=  55% to 60%.  -(grade 2 diastolic dysfunction). - Aortic valve: There was no stenosis. - Mitral valve: Mildly to moderately calcified annulus. Mild regurgitation. - Left atrium: The atrium was moderately to severely dilated. - Right ventricle: The cavity size was normal. Systolic function was normal. - Right atrium: The atrium was moderately to severely dilated. - Tricuspid valve: Peak RV-RA gradient:85mm Hg (S). - Pulmonary arteries: PA systolic pressure 53-57 mmHg. - Systemic veins: IVC measured 2.3 cm with < 50% respirophasic variation, suggesting RA pressure 11-15 mmHg. - Pericardium, extracardiac: A trivial pericardial effusion was identified posterior to the heart.      Antibiotics Ceftriaxone 12/19>>   Past Medical History  Diagnosis Date  . Atrial fibrillation   . Altered mental status   . Encephalopathy   . Parkinson disease   . Hypertension   . GERD (gastroesophageal reflux disease)   . Hyperlipemia   . Diabetes mellitus   . Coronary artery disease   . History of adenomatous polyp of colon 06/24/99  . Enlarged heart   . DEMENTIA   . Edema of lower extremity 07/13/11    right leg more swollen than left leg  .  Esophageal  dysmotility 07/02/12  . Horseshoe kidney   . Pancreatic lesion 05/22/11    no further workup  per PCP/family due to age  . Anemia   . Peripheral neuropathy   . Depression   . Thrombophlebitis    Past Surgical History  Procedure Laterality Date  . Shoulder surgery  2001    left clavicle excision and acromioplasty  . Abdominal hysterectomy    . Cholecystectomy    . Total hip arthroplasty    . Breast surgery      2 benign tumors removed left breast  . Foot surgery      benign tumors from foot  . Esophageal dilation      several times by Dr. Victorino Dike  . Nose surgery    . Esophagogastroduodenoscopy (egd) with esophageal dilation N/A 08/01/2012    Procedure: ESOPHAGOGASTRODUODENOSCOPY (EGD) WITH ESOPHAGEAL DILATION;  Surgeon: Hart Carwin, MD;  Location: [facility];  Service: Endoscopy;  Laterality: N/A;  with c-arm savory dilators   Social History:  reports that she has quit smoking. She has never used smokeless tobacco. She reports that she does not drink alcohol or use illicit drugs. where does patient live--home, ALF, SNF? [facility] Can patient participate in ADLs? No  No Known Allergies  Family History  Problem Relation Age of Onset  . Cancer    . Heart disease      Prior to Admission medications   Medication Sig Start Date End Date Taking? Authorizing Provider  acetaminophen (TYLENOL) 325 MG tablet Take 650 mg by mouth 2 (two) times daily. For OA.   Yes Historical Provider, MD  ALPRAZolam (XANAX) 0.25 MG tablet Take 0.25 mg by mouth 2 (two) times daily as needed for anxiety. Take 1 tablet by mouth daily at 2 pm for anxiety. 04/03/13  Yes Tiffany L Reed, DO  ALPRAZolam (XANAX) 0.25 MG tablet Take 0.25 mg by mouth at bedtime as needed for anxiety.   Yes Historical Provider, MD  Alum & Mag Hydroxide-Simeth (MAGIC MOUTHWASH) SOLN Take 10 mLs by mouth as needed for mouth pain.   Yes Historical Provider, MD  amLODipine (NORVASC) 5 MG tablet Take 5 mg by mouth daily.    Yes Historical Provider, MD  aspirin 81 MG chewable tablet Chew 81 mg by mouth daily.   Yes Historical Provider, MD  cefTRIAXone 1 g/4 mLs in lidocaine (with preservative) injection Inject 1 g into the muscle once.   Yes Historical Provider, MD  cholecalciferol (VITAMIN D) 1000 UNITS tablet Take 1,000 Units by mouth daily.   Yes Historical Provider, MD  cloNIDine (CATAPRES) 0.1 MG tablet Take 0.1 mg by mouth every 6 (six) hours as needed (SBP greater than 160).    Yes Historical Provider, MD  clopidogrel (PLAVIX) 75 MG tablet Take 75 mg by mouth daily.    Yes Historical Provider, MD  Cranberry 475 MG CAPS Take 1 capsule by mouth 2 (two) times daily.   Yes Historical Provider, MD  dextromethorphan-guaiFENesin (MUCINEX DM) 30-600 MG per 12 hr tablet Take 1 tablet by mouth every 12 (twelve) hours.   Yes Historical Provider, MD  diclofenac sodium (VOLTAREN) 1 % GEL Apply 2 g topically 2 (two) times daily as needed (left neck pain).    Yes Historical Provider, MD  donepezil (ARICEPT) 5 MG tablet Take 5 mg by mouth at bedtime.   Yes Historical Provider, MD  dorzolamide (TRUSOPT) 2 % ophthalmic solution Place 1 drop into both eyes 3 (three) times daily.  Yes Historical Provider, MD  furosemide (LASIX) 40 MG tablet Take 40 mg by mouth daily.   Yes Historical Provider, MD  gabapentin (NEURONTIN) 300 MG capsule Take 300 mg by mouth 2 (two) times daily.   Yes Historical Provider, MD  ibuprofen (ADVIL,MOTRIN) 400 MG tablet Take 400 mg by mouth daily at 12 noon.    Yes Historical Provider, MD  ketotifen (ZADITOR) 0.025 % ophthalmic solution Place 1 drop into both eyes 2 (two) times daily.   Yes Historical Provider, MD  lactulose (CHRONULAC) 10 GM/15ML solution Take 20 g by mouth 2 (two) times daily.   Yes Historical Provider, MD  lidocaine (LIDODERM) 5 % Place 1 patch onto the skin daily. Apply 1 patch to left lower back/hip every morning.  Remove & Discard patch within 12 hours or as directed by MD   Yes  Historical Provider, MD  lisinopril (PRINIVIL,ZESTRIL) 40 MG tablet Take 40 mg by mouth daily.   Yes Historical Provider, MD  loratadine (CLARITIN) 10 MG tablet Take 10 mg by mouth daily.   Yes Historical Provider, MD  magnesium citrate SOLN Take 0.5 Bottles by mouth 2 (two) times daily. For 1 day.   Yes Historical Provider, MD  magnesium hydroxide (MILK OF MAGNESIA) 400 MG/5ML suspension Take 30 mLs by mouth daily as needed for mild constipation.   Yes Historical Provider, MD  metFORMIN (GLUCOPHAGE) 1000 MG tablet Take 1,000 mg by mouth 2 (two) times daily with a meal.   Yes Historical Provider, MD  metoCLOPramide (REGLAN) 5 MG tablet Take 2.5 mg by mouth 4 (four) times daily -  before meals and at bedtime.   Yes Historical Provider, MD  Multiple Vitamins-Minerals (CERTAGEN PO) Take 1 tablet by mouth daily.    Yes Historical Provider, MD  ondansetron (ZOFRAN) 4 MG tablet Take 4 mg by mouth every 8 (eight) hours as needed for nausea or vomiting.   Yes Historical Provider, MD  pantoprazole (PROTONIX) 40 MG tablet Take 40 mg by mouth 2 (two) times daily.    Yes Historical Provider, MD  Polyethyl Glycol-Propyl Glycol (SYSTANE OP) Place 1 drop into both eyes 4 (four) times daily.   Yes Historical Provider, MD  polyethylene glycol (MIRALAX / GLYCOLAX) packet Take 17 g by mouth daily.   Yes Historical Provider, MD  potassium chloride SA (K-DUR,KLOR-CON) 20 MEQ tablet Take 20 mEq by mouth daily.    Yes Historical Provider, MD  pravastatin (PRAVACHOL) 20 MG tablet Take 20 mg by mouth at bedtime.    Yes Historical Provider, MD  promethazine (PHENERGAN) 25 MG/ML injection Inject 12.5 mg into the vein once.   Yes Historical Provider, MD  sertraline (ZOLOFT) 50 MG tablet Take 50 mg by mouth at bedtime.   Yes Historical Provider, MD  sodium chloride 0.9 % SOLN 1,000 mL with promethazine 25 MG/ML SOLN 25 mg Inject 12.5 mg into the vein every 6 (six) hours as needed (nausea and vomiting).   Yes Historical Provider,  MD  Travoprost, BAK Free, (TRAVATAN) 0.004 % SOLN ophthalmic solution Place 2 drops into both eyes at bedtime.    Yes Historical Provider, MD  traZODone (DESYREL) 50 MG tablet Take 100 mg by mouth at bedtime.    Yes Historical Provider, MD  vitamin B-12 (CYANOCOBALAMIN) 500 MCG tablet Take 500 mcg by mouth daily.   Yes Historical Provider, MD  vitamin C (ASCORBIC ACID) 500 MG tablet Take 500 mg by mouth daily.   Yes Historical Provider, MD  fluconazole (DIFLUCAN) 100 MG tablet  Take 100 mg by mouth daily. For 7 days. 05/17/13   Historical Provider, MD   Physical Exam: Filed Vitals:   05/23/13 1924 05/23/13 1933 05/23/13 2352  BP: 169/93  131/58  Pulse: 96  83  Temp: 100.1 F (37.8 C) 99.3 F (37.4 C) 98.8 F (37.1 C)  TempSrc: Oral Rectal Oral  Resp: 18  17  SpO2: 95%  95%     General: Patient sleepy but arousable following procedure, NAD  Eyes: Shows equal round reactive to light and accommodation  Neck: Negative JVD, negative lymphadenopathy  Cardiovascular: Regular rhythm and rate, negative murmurs rubs or gallops, DP/PT pulse one plus bilateral  Respiratory: Clear to auscultation bilateral  Abdomen: Soft, nontender, plus bowel sounds  Skin: Negative lacerations, rash  Musculoskeletal: Right lower extremity +1 pedal edema  Neurologic: Unable to complete exam secondary to patient having just returned from procedure  Labs on Admission:  Basic Metabolic Panel:  Recent Labs Lab 05/23/13 1910  NA 146*  K 2.5*  CL 107  CO2 25  GLUCOSE 164*  BUN 9  CREATININE 0.79  CALCIUM 9.4   Liver Function Tests:  Recent Labs Lab 05/23/13 1910  AST 18  ALT 8  ALKPHOS 63  BILITOT 0.3  PROT 7.0  ALBUMIN 3.8   No results found for this basename: LIPASE, AMYLASE,  in the last 168 hours No results found for this basename: AMMONIA,  in the last 168 hours CBC:  Recent Labs Lab 05/23/13 1910  WBC 6.5  HGB 10.1*  HCT 30.6*  MCV 99.7  PLT 132*   Cardiac  Enzymes: No results found for this basename: CKTOTAL, CKMB, CKMBINDEX, TROPONINI,  in the last 168 hours  BNP (last 3 results) No results found for this basename: PROBNP,  in the last 8760 hours CBG: No results found for this basename: GLUCAP,  in the last 168 hours  Radiological Exams on Admission: Ct Abdomen Pelvis W Contrast  05/23/2013   CLINICAL DATA:  79 year old female with abdominal and pelvic pain. Nausea and vomiting.  EXAM: CT ABDOMEN AND PELVIS WITH CONTRAST  TECHNIQUE: Multidetector CT imaging of the abdomen and pelvis was performed using the standard protocol following bolus administration of intravenous contrast.  CONTRAST:  OMNIPAQUE IOHEXOL 300 MG/ML  SOLN  COMPARISON:  05/22/2011 CT and radiographs performed today.  FINDINGS: There is a volvulus of the mid-distal sigmoid colon causing a high-grade obstruction. There is a small amount of fluid within the rectum present.  There is no evidence of pneumoperitoneum. A trace amount of free pelvic fluid is noted.  Horseshoe kidney is again noted with nonobstructing left renal calculus.  Mild intrahepatic biliary fullness is identified.  The spleen and adrenal glands are unremarkable. A 1 x 2 cm hypodense lesion within the pancreatic body is unchanged. There is no evidence of distal pancreatic ductal dilatation.  There is no evidence of enlarged lymph nodes or abdominal aortic aneurysm.  No acute or suspicious bony abnormalities are identified. Left hip replacement is present.  Cardiomegaly and mild bibasilar atelectasis identified.  IMPRESSION: Mid-distal sigmoid colonic volvulus causing high-grade obstruction. No evidence of pneumoperitoneum.  Horseshoe kidney with nonobstructing left renal calculus.  Unchanged 1 x 2 cm pancreatic body lesion.  Cardiomegaly and mild bibasilar atelectasis.   Electronically Signed   By: Laveda Abbe M.D.   On: 05/23/2013 22:59   Dg Abd Acute W/chest  05/23/2013   CLINICAL DATA:  79 year old female with  abdominal pain, distention and vomiting.  EXAM: ACUTE  ABDOMEN SERIES (ABDOMEN 2 VIEW & CHEST 1 VIEW)  COMPARISON:  04/19/2013 and prior radiographs dating back to 05/22/2011. 05/22/2011 CT  FINDINGS: Cardiomegaly noted.  Pulmonary vascular congestion is identified.  Patchy opacities/atelectasis within the left lung again noted.  Moderate to severe gaseous distention of the colon is noted. The sigmoid colon measures up to 13.6 cm. A sigmoid obstruction is not excluded although this bowel gas pattern has a somewhat similar appearance to 05/22/2011. .  There is no evidence of pneumoperitoneum.  Surgical clips within the right upper abdomen are noted.  A remote right clavicle fracture and left hip arthroplasty again identified.  IMPRESSION: Gaseous distention of the colon with the sigmoid colon measuring up to 13.6 cm. This could represent a sigmoid obstruction but has a somewhat similar appearance to 05/22/2011. CT may be helpful for further evaluation.  Cardiomegaly with pulmonary vascular congestion and chronic left lung opacities.   Electronically Signed   By: Laveda Abbe M.D.   On: 05/23/2013 21:22    EKG: Sinus arrhythmia, short PR interval, (unimpressive EKG)   Assessment/Plan Active Problems:   * No active hospital problems. *  Sigmoid Volvulus -Dr. Erick Blinks (gastroenterology) performed a emergent flexible sigmoidoscopy for decompression -Continue NG tube to wall suction -Continue n.p.o. until GI clears.    UTI -Continue ceftriaxone until urine culture returns  Hypokalemia -ED repleted will obtain stat repeat potassium level to determine if additional potassium required. K goal > 49meq/L -Obtain stat magnesium; mg goal >2  Diastolic CHF -EKG; see results above -On exam patient appears to be compensated  Pulmonary hypertension - Stable  HTN -Within AHA guidelines -Hold all home medications compared to them being by mouth and patient having an NG tube placed -Start IV metoprolol 5 mg  TID to control patient's BP  Atrial fibrillation -Currently in normal sinus rhythm -Aspirin and Plavix held secondary to sigmoid volvulus  Diabetes type 2 -Will avoid metformin for the next 48 hours secondary to dye load patient received for CT, control with moderate SSI  Parkinson disease -Hold home meds until patient clear for PO   Sacral decubitus -Consulted wound care to evaluate and treat  Dementia -Hold home medication until patient cleared for PO  Depression -Hold home medication until patient cleared for PO    Code Status: DNR  Family Communication: Daughters present for discussion of plan of care  Disposition Plan: Per GI   Time spent: 90 minutes   Olando Willems, Roselind Messier [facility] Pager [phone]  If 7PM-7AM, please contact night-coverage [email or web address] Password [email or web address] 05/24/2013, 12:21 AM

## 2013-05-24 NOTE — Progress Notes (Signed)
Patient seen and examined this morning. Presented with volvulus s/p decompression. Appreciate GI and Surgery input.  - correct hypokalemia - start clears - grade 2 diastolic dysfunction on 2D Echo 2012, careful monitoring of fluid status, daily weights.   Rector Devonshire M. Elvera Lennox, MD [facility] [phone]

## 2013-05-24 NOTE — ED Notes (Signed)
Patient transported to endoscopy. The patient is off the floor

## 2013-05-24 NOTE — Progress Notes (Signed)
Pt noted to have Small stage 2 to lower sacral area have attempted several dressings to protect area however Pt is frequently incont of large amount of urine. Pt also incont of large loose stools today. Unable to keep dressings on and dry. Barrier Creme applied to areas.

## 2013-05-24 NOTE — Progress Notes (Signed)
Patient ID: Maria Christian, female   DOB: [birth year], 79 y.o.   MRN: [id number] [facility] Progress Note  Subjective: Feels much better- has had 3 large Bm's loose per nursing. Pt denies pain, wants something to drink. Asks... "how long do you suppose my bowel was twisted like that"  Objective:  Vital signs in last 24 hours: Temp:  [98.1 F (36.7 C)-100.1 F (37.8 C)] 98.1 F (36.7 C) (12/20 0412) Pulse Rate:  [62-96] 65 (12/20 0412) Resp:  [13-24] 16 (12/20 0412) BP: (118-169)/(48-93) 133/70 mmHg (12/20 0412) SpO2:  [95 %-99 %] 98 % (12/20 0412) Weight:  [160 lb 8 oz (72.802 kg)] 160 lb 8 oz (72.802 kg) (12/20 0412) Last BM Date: 05/23/13 General:   Alert,  Well-developed,  Elderly WF, HOH  in NAD.. NG tube in- no drainage Heart:  Regular rate and rhythm; no murmurs Pulm;clear  Abdomen:  Soft, nontender and nondistended. Normal bowel sounds, without guarding, and without rebound.   Extremities:  Without edema. Neurologic:  Alert and  oriented x4;  grossly normal neurologically. Psych:  Alert and cooperative. Normal mood and affect.  Intake/Output from previous day: 12/19 0701 - 12/20 0700 In: 658.8 [I.V.:158.8; IV Piggyback:500] Out: 1 [Urine:1] Intake/Output this shift:    Lab Results:  Recent Labs  05/23/13 1910 05/24/13 0518  WBC 6.5 6.2  HGB 10.1* 8.7*  HCT 30.6* 26.2*  PLT 132* 103*   BMET  Recent Labs  05/23/13 1910 05/24/13 0250 05/24/13 0518 05/24/13 0920  NA 146*  --  143 145  K 2.5* 2.3* 2.6* 2.8*  CL 107  --  109 111  CO2 25  --  24 27  GLUCOSE 164*  --  143* 86  BUN 9  --  8 9  CREATININE 0.79  --  0.81 0.85  CALCIUM 9.4  --  8.4 8.6   LFT  Recent Labs  05/23/13 1910 05/24/13 0518  PROT 7.0 5.9*  ALBUMIN 3.8 3.1*  AST 18 14  ALT 8 7  ALKPHOS 63 53  BILITOT 0.3 0.2*  BILIDIR <0.1  --   IBILI NOT CALCULATED  --    PT/INR No results found for this basename: LABPROT, INR,  in the last 72 hours Hepatitis Panel No  results found for this basename: HEPBSAG, HCVAB, HEPAIGM, HEPBIGM,  in the last 72 hours  Assessment / Plan: #1  79 yo female with sigmoid volvulus- s/p decompression during night- evacuating bowels Will check plain films Clamp NG Start sips clears #2 hypokalemia- needs corrected-defer to medicine team #3 Normocytic anemia #4 DM #5 parkinson's Active Problems:   Atrial fibrillation with controlled ventricular response   Diastolic CHF, chronic   Parkinson disease   Hypertension   Hyperlipemia   Diabetes mellitus   Coronary artery disease   Thrombocytopenia   TIA (transient ischemic attack)   Dementia   Depression   Sigmoid volvulus   Pulmonary hypertension   Sacral decubitus ulcer   HOH (hard of hearing)     LOS: 1 day   [name]  05/24/2013, 11:17 AM

## 2013-05-24 NOTE — Consult Note (Signed)
Admission Note  Primary Care Physician:  Karlene Einstein, MD Primary Gastroenterologist:    Juanda Chance  HPI: Maria Christian is a 79 y.o. female with past medical history of dementia, atrial fibrillation on Plavix, Parkinson's disease, hypertension, GERD, diabetes, pancreatic lesion elected not to be evaluated further due to age, and chronic constipation who presented to the emergency department tonight with increasing abdominal distention and 2 days of nausea with vomiting. Reportedly she was having small loose stools without apparent blood or melena. She is unable to provide much history, but her daughters are at the bedside. They report she has been having increasing abdominal distention pain over approximate two-week period, definitely worse in the last 48 hours   X-ray showed colonic distention and CT scan of the abdomen and pelvis showed sigmoid volvulus with high-grade obstruction   Past Medical History  Diagnosis Date  . Atrial fibrillation   . Altered mental status   . Encephalopathy   . Parkinson disease   . Hypertension   . GERD (gastroesophageal reflux disease)   . Hyperlipemia   . Diabetes mellitus   . Coronary artery disease   . History of adenomatous polyp of colon 06/24/99  . Enlarged heart   . DEMENTIA   . Edema of lower extremity 07/13/11    right leg more swollen than left leg  . Esophageal dysmotility 07/02/12  . Horseshoe kidney   . Pancreatic lesion 05/22/11    no further workup  per PCP/family due to age  . Anemia   . Peripheral neuropathy   . Depression   . Thrombophlebitis     Past Surgical History  Procedure Laterality Date  . Shoulder surgery  2001    left clavicle excision and acromioplasty  . Abdominal hysterectomy    . Cholecystectomy    . Total hip arthroplasty    . Breast surgery      2 benign tumors removed left breast  . Foot surgery      benign tumors from foot  . Esophageal dilation      several times by Dr. Victorino Dike  . Nose  surgery    . Esophagogastroduodenoscopy (egd) with esophageal dilation N/A 08/01/2012    Procedure: ESOPHAGOGASTRODUODENOSCOPY (EGD) WITH ESOPHAGEAL DILATION;  Surgeon: Hart Carwin, MD;  Location: [facility];  Service: Endoscopy;  Laterality: N/A;  with c-arm savory dilators    Prior to Admission medications   Medication Sig Start Date End Date Taking? Authorizing Provider  acetaminophen (TYLENOL) 325 MG tablet Take 650 mg by mouth 2 (two) times daily. For OA.   Yes Historical Provider, MD  ALPRAZolam (XANAX) 0.25 MG tablet Take 0.25 mg by mouth 2 (two) times daily as needed for anxiety. Take 1 tablet by mouth daily at 2 pm for anxiety. 04/03/13  Yes Tiffany L Reed, DO  ALPRAZolam (XANAX) 0.25 MG tablet Take 0.25 mg by mouth at bedtime as needed for anxiety.   Yes Historical Provider, MD  Alum & Mag Hydroxide-Simeth (MAGIC MOUTHWASH) SOLN Take 10 mLs by mouth as needed for mouth pain.   Yes Historical Provider, MD  amLODipine (NORVASC) 5 MG tablet Take 5 mg by mouth daily.   Yes Historical Provider, MD  aspirin 81 MG chewable tablet Chew 81 mg by mouth daily.   Yes Historical Provider, MD  cefTRIAXone 1 g/4 mLs in lidocaine (with preservative) injection Inject 1 g into the muscle once.   Yes Historical Provider, MD  cholecalciferol (VITAMIN D) 1000 UNITS tablet Take 1,000 Units by mouth  daily.   Yes Historical Provider, MD  cloNIDine (CATAPRES) 0.1 MG tablet Take 0.1 mg by mouth every 6 (six) hours as needed (SBP greater than 160).    Yes Historical Provider, MD  clopidogrel (PLAVIX) 75 MG tablet Take 75 mg by mouth daily.    Yes Historical Provider, MD  Cranberry 475 MG CAPS Take 1 capsule by mouth 2 (two) times daily.   Yes Historical Provider, MD  dextromethorphan-guaiFENesin (MUCINEX DM) 30-600 MG per 12 hr tablet Take 1 tablet by mouth every 12 (twelve) hours.   Yes Historical Provider, MD  diclofenac sodium (VOLTAREN) 1 % GEL Apply 2 g topically 2 (two) times daily as needed (left neck  pain).    Yes Historical Provider, MD  donepezil (ARICEPT) 5 MG tablet Take 5 mg by mouth at bedtime.   Yes Historical Provider, MD  dorzolamide (TRUSOPT) 2 % ophthalmic solution Place 1 drop into both eyes 3 (three) times daily.    Yes Historical Provider, MD  furosemide (LASIX) 40 MG tablet Take 40 mg by mouth daily.   Yes Historical Provider, MD  gabapentin (NEURONTIN) 300 MG capsule Take 300 mg by mouth 2 (two) times daily.   Yes Historical Provider, MD  ibuprofen (ADVIL,MOTRIN) 400 MG tablet Take 400 mg by mouth daily at 12 noon.    Yes Historical Provider, MD  ketotifen (ZADITOR) 0.025 % ophthalmic solution Place 1 drop into both eyes 2 (two) times daily.   Yes Historical Provider, MD  lactulose (CHRONULAC) 10 GM/15ML solution Take 20 g by mouth 2 (two) times daily.   Yes Historical Provider, MD  lidocaine (LIDODERM) 5 % Place 1 patch onto the skin daily. Apply 1 patch to left lower back/hip every morning.  Remove & Discard patch within 12 hours or as directed by MD   Yes Historical Provider, MD  lisinopril (PRINIVIL,ZESTRIL) 40 MG tablet Take 40 mg by mouth daily.   Yes Historical Provider, MD  loratadine (CLARITIN) 10 MG tablet Take 10 mg by mouth daily.   Yes Historical Provider, MD  magnesium citrate SOLN Take 0.5 Bottles by mouth 2 (two) times daily. For 1 day.   Yes Historical Provider, MD  magnesium hydroxide (MILK OF MAGNESIA) 400 MG/5ML suspension Take 30 mLs by mouth daily as needed for mild constipation.   Yes Historical Provider, MD  metFORMIN (GLUCOPHAGE) 1000 MG tablet Take 1,000 mg by mouth 2 (two) times daily with a meal.   Yes Historical Provider, MD  metoCLOPramide (REGLAN) 5 MG tablet Take 2.5 mg by mouth 4 (four) times daily -  before meals and at bedtime.   Yes Historical Provider, MD  Multiple Vitamins-Minerals (CERTAGEN PO) Take 1 tablet by mouth daily.    Yes Historical Provider, MD  ondansetron (ZOFRAN) 4 MG tablet Take 4 mg by mouth every 8 (eight) hours as needed for  nausea or vomiting.   Yes Historical Provider, MD  pantoprazole (PROTONIX) 40 MG tablet Take 40 mg by mouth 2 (two) times daily.    Yes Historical Provider, MD  Polyethyl Glycol-Propyl Glycol (SYSTANE OP) Place 1 drop into both eyes 4 (four) times daily.   Yes Historical Provider, MD  polyethylene glycol (MIRALAX / GLYCOLAX) packet Take 17 g by mouth daily.   Yes Historical Provider, MD  potassium chloride SA (K-DUR,KLOR-CON) 20 MEQ tablet Take 20 mEq by mouth daily.    Yes Historical Provider, MD  pravastatin (PRAVACHOL) 20 MG tablet Take 20 mg by mouth at bedtime.    Yes Historical Provider,  MD  promethazine (PHENERGAN) 25 MG/ML injection Inject 12.5 mg into the vein once.   Yes Historical Provider, MD  sertraline (ZOLOFT) 50 MG tablet Take 50 mg by mouth at bedtime.   Yes Historical Provider, MD  sodium chloride 0.9 % SOLN 1,000 mL with promethazine 25 MG/ML SOLN 25 mg Inject 12.5 mg into the vein every 6 (six) hours as needed (nausea and vomiting).   Yes Historical Provider, MD  Travoprost, BAK Free, (TRAVATAN) 0.004 % SOLN ophthalmic solution Place 2 drops into both eyes at bedtime.    Yes Historical Provider, MD  traZODone (DESYREL) 50 MG tablet Take 100 mg by mouth at bedtime.    Yes Historical Provider, MD  vitamin B-12 (CYANOCOBALAMIN) 500 MCG tablet Take 500 mcg by mouth daily.   Yes Historical Provider, MD  vitamin C (ASCORBIC ACID) 500 MG tablet Take 500 mg by mouth daily.   Yes Historical Provider, MD  fluconazole (DIFLUCAN) 100 MG tablet Take 100 mg by mouth daily. For 7 days. 05/17/13   Historical Provider, MD    Current Facility-Administered Medications  Medication Dose Route Frequency Provider Last Rate Last Dose  . 0.9 %  sodium chloride infusion   Intravenous Continuous Beverley Fiedler, MD      . cefTRIAXone (ROCEPHIN) 1 g in dextrose 5 % 50 mL IVPB  1 g Intravenous Once Dagmar Hait, MD      . fentaNYL (SUBLIMAZE) injection    PRN Beverley Fiedler, MD   25 mcg at 05/24/13  0116  . midazolam (VERSED) injection    PRN Beverley Fiedler, MD   2 mg at 05/24/13 0116  . potassium chloride 10 mEq in 100 mL IVPB  10 mEq Intravenous Q1 Hr x 6 Dagmar Hait, MD 100 mL/hr at 05/23/13 2238 10 mEq at 05/23/13 2238    Allergies as of 05/23/2013  . (No Known Allergies)    Family History  Problem Relation Age of Onset  . Cancer    . Heart disease      History   Social History  . Marital Status: Widowed    Spouse Name: N/A    Number of Children: 5  . Years of Education: 10th   Occupational History  . Retired    Social History Main Topics  . Smoking status: Former Games developer  . Smokeless tobacco: Never Used  . Alcohol Use: No  . Drug Use: No  . Sexual Activity: Not on file   Other Topics Concern  . Not on file   Social History Narrative   Pt lives at [facility] and [place].   Caffeine Use: very small amount daily    Review of Systems:  All systems reviewed an negative except where noted in HPI.  Physical Exam: Vital signs in last 24 hours: Temp:  [98.8 F (37.1 C)-100.1 F (37.8 C)] 99.1 F (37.3 C) (12/20 0045) Pulse Rate:  [83-96] 83 (12/19 2352) Resp:  [17-18] 17 (12/19 2352) BP: (131-169)/(58-93) 131/58 mmHg (12/19 2352) SpO2:  [95 %] 95 % (12/19 2352)   Gen: awake, alert, disoriented, no obvious distress HEENT: anicteric, op clear, NG tube in place CV: Irregularly irregular Pulm: CTA b/l Abd: Tense with severe distention and increased tympany, hypoactive bowel sounds Ext: no c/c/e Neuro: nonfocal   Lab Results:  Recent Labs  05/23/13 1910  WBC 6.5  HGB 10.1*  HCT 30.6*  PLT 132*   BMET  Recent Labs  05/23/13 1910  NA 146*  K  2.5*  CL 107  CO2 25  GLUCOSE 164*  BUN 9  CREATININE 0.79  CALCIUM 9.4   LFT  Recent Labs  05/23/13 1910  PROT 7.0  ALBUMIN 3.8  AST 18  ALT 8  ALKPHOS 63  BILITOT 0.3  BILIDIR <0.1  IBILI NOT CALCULATED   PT/INR No results found for this basename: LABPROT, INR,  in  the last 72 hours Hepatitis Panel No results found for this basename: HEPBSAG, HCVAB, HEPAIGM, HEPBIGM,  in the last 72 hours  Studies/Results: Ct Abdomen Pelvis W Contrast  05/23/2013   CLINICAL DATA:  79 year old female with abdominal and pelvic pain. Nausea and vomiting.  EXAM: CT ABDOMEN AND PELVIS WITH CONTRAST  TECHNIQUE: Multidetector CT imaging of the abdomen and pelvis was performed using the standard protocol following bolus administration of intravenous contrast.  CONTRAST:  OMNIPAQUE IOHEXOL 300 MG/ML  SOLN  COMPARISON:  05/22/2011 CT and radiographs performed today.  FINDINGS: There is a volvulus of the mid-distal sigmoid colon causing a high-grade obstruction. There is a small amount of fluid within the rectum present.  There is no evidence of pneumoperitoneum. A trace amount of free pelvic fluid is noted.  Horseshoe kidney is again noted with nonobstructing left renal calculus.  Mild intrahepatic biliary fullness is identified.  The spleen and adrenal glands are unremarkable. A 1 x 2 cm hypodense lesion within the pancreatic body is unchanged. There is no evidence of distal pancreatic ductal dilatation.  There is no evidence of enlarged lymph nodes or abdominal aortic aneurysm.  No acute or suspicious bony abnormalities are identified. Left hip replacement is present.  Cardiomegaly and mild bibasilar atelectasis identified.  IMPRESSION: Mid-distal sigmoid colonic volvulus causing high-grade obstruction. No evidence of pneumoperitoneum.  Horseshoe kidney with nonobstructing left renal calculus.  Unchanged 1 x 2 cm pancreatic body lesion.  Cardiomegaly and mild bibasilar atelectasis.   Electronically Signed   By: Laveda Abbe M.D.   On: 05/23/2013 22:59   Dg Abd Acute W/chest  05/23/2013   CLINICAL DATA:  79 year old female with abdominal pain, distention and vomiting.  EXAM: ACUTE ABDOMEN SERIES (ABDOMEN 2 VIEW & CHEST 1 VIEW)  COMPARISON:  04/19/2013 and prior radiographs dating back to  05/22/2011. 05/22/2011 CT  FINDINGS: Cardiomegaly noted.  Pulmonary vascular congestion is identified.  Patchy opacities/atelectasis within the left lung again noted.  Moderate to severe gaseous distention of the colon is noted. The sigmoid colon measures up to 13.6 cm. A sigmoid obstruction is not excluded although this bowel gas pattern has a somewhat similar appearance to 05/22/2011. .  There is no evidence of pneumoperitoneum.  Surgical clips within the right upper abdomen are noted.  A remote right clavicle fracture and left hip arthroplasty again identified.  IMPRESSION: Gaseous distention of the colon with the sigmoid colon measuring up to 13.6 cm. This could represent a sigmoid obstruction but has a somewhat similar appearance to 05/22/2011. CT may be helpful for further evaluation.  Cardiomegaly with pulmonary vascular congestion and chronic left lung opacities.   Electronically Signed   By: Laveda Abbe M.D.   On: 05/23/2013 21:22    Impression / Plan:   79 y.o. female with past medical history of dementia, atrial fibrillation on Plavix, Parkinson's disease, hypertension, GERD, diabetes, pancreatic lesion elected not to be evaluated further due to age, and chronic constipation who presented to the emergency department tonight found to have sigmoid volvulus with high-grade obstruction  1.  Sigmoid volvulus -- I discussed sigmoid volvulus  with the patient and her daughters. I have recommended emergent flexible sigmoidoscopy for decompression. We discussed that ultimately treatment for volvulus to prevent recurrence the surgical.  That being said, she is high-risk for surgery and the decision may be for observation.  I have explained that if we are successful decompression tonight, sigmoid volvulus may occur again in the future if not definitively corrected surgically.    HIGHER THAN BASELINE RISK given volvulus.The nature of the procedure, as well as the risks, benefits, and alternatives were carefully  and thoroughly reviewed with the patient's daughters. Ample time for discussion and questions allowed. They understood, were satisfied, and agreed to proceed.          LOS: 1 day   Jessicca Stitzer M  05/24/2013, 1:16 AM

## 2013-05-24 NOTE — ED Notes (Signed)
EKG given to EDP,[place],MD. For review. 

## 2013-05-24 NOTE — Progress Notes (Signed)
Maria Christian [id number] 09/21/1924  CARE TEAM:  PCP: Karlene Einstein, MD  Outpatient Care Team: Patient Care Team: Angela Cox, MD as PCP - General (Internal Medicine)  Inpatient Treatment Team: Treatment Team: Attending Provider: Leatha Gilding, MD; Consulting Physician: Bishop Limbo, MD; Rounding Team: Alton Revere, MD; Registered Nurse: Erick Colace, RN; Registered Nurse: Carmelina Peal, RN; Technician: Lynwood Dawley, NT   Subjective:  Sig volvulus untwisted by GI Denies pain Wants water  Objective:  Vital signs:  Filed Vitals:   05/24/13 0155 05/24/13 0300 05/24/13 0321 05/24/13 0412  BP: 122/48  134/73 133/70  Pulse: 67  62 65  Temp:  98.5 F (36.9 C) 98.3 F (36.8 C) 98.1 F (36.7 C)  TempSrc:  Oral Oral Oral  Resp: 17  14 16   Height:    5\' 5"  (1.651 m)  Weight:    160 lb 8 oz (72.802 kg)  SpO2: 96%  97% 98%    Last BM Date: 05/23/13  Intake/Output   Yesterday:  12/19 0701 - 12/20 0700 In: 658.8 [I.V.:158.8; IV Piggyback:500] Out: 1 [Urine:1] This shift:     Bowel function:  Flatus: n  BM: n  Drain: Scant clear return.  Flushes OK  Physical Exam:  General: Pt awake/alert/oriented x2 in no acute distress Eyes: PERRL, normal EOM.  Sclera clear.  No icterus Neuro: CN II-XII intact w/o focal sensory/motor deficits. Lymph: No head/neck/groin lymphadenopathy Psych:  No delerium/psychosis/paranoia HENT: Normocephalic, Mucus membranes moist.  No thrush Neck: Supple, No tracheal deviation Chest: No chest wall pain w good excursion CV:  Pulses intact.  Regular rhythm MS: Normal AROM mjr joints.  No obvious deformity Abdomen: Soft.  Nondistended.  NONtender.  No evidence of peritonitis.  No incarcerated hernias. Ext:  SCDs BLE.  No mjr edema.  No cyanosis Skin: No petechiae / purpura   Problem List:   Active Problems:   Atrial fibrillation with controlled ventricular response   Diastolic CHF, chronic   Parkinson disease    Hypertension   Hyperlipemia   Diabetes mellitus   Coronary artery disease   Thrombocytopenia   TIA (transient ischemic attack)   Dementia   Depression   Sigmoid volvulus   Pulmonary hypertension   Sacral decubitus ulcer   Assessment  Maria Christian  79 y.o. female  Day of Surgery  Procedure(s): FLEXIBLE SIGMOIDOSCOPY  Stabilizing s/p untwisting of sigmoid volvulus  Plan:  -NGT clamping trial -palliate NGT -VTE prophylaxis- SCDs, etc -mobilize as tolerated to help recovery  Try to avoid sigmoid colectomy given adv age & comorbidities.  D/w pt & DR Elvera Lennox with Int Med  Ardeth Sportsman, M.D., F.A.C.S. Gastrointestinal and Minimally Invasive Surgery [facility] [address], [address] [place], [place] [address] [phone] Main / Paging   05/24/2013   Results:   Labs: Results for orders placed during the hospital encounter of 05/23/13 (from the past 48 hour(s))  CBC     Status: Abnormal   Collection Time    05/23/13  7:10 PM      Result Value Range   WBC 6.5  4.0 - 10.5 K/uL   RBC 3.07 (*) 3.87 - 5.11 MIL/uL   Hemoglobin 10.1 (*) 12.0 - 15.0 g/dL   HCT 29.5 (*) 28.4 - 13.2 %   MCV 99.7  78.0 - 100.0 fL   MCH 32.9  26.0 - 34.0 pg   MCHC 33.0  30.0 - 36.0 g/dL   RDW 44.0  10.2 -  15.5 %   Platelets 132 (*) 150 - 400 K/uL  BASIC METABOLIC PANEL     Status: Abnormal   Collection Time    05/23/13  7:10 PM      Result Value Range   Sodium 146 (*) 135 - 145 mEq/L   Potassium 2.5 (*) 3.5 - 5.1 mEq/L   Comment: CRITICAL RESULT CALLED TO, READ BACK BY AND VERIFIED WITH:     L ADKINS RN 2128 05/23/13 A NAVARRO   Chloride 107  96 - 112 mEq/L   CO2 25  19 - 32 mEq/L   Glucose, Bld 164 (*) 70 - 99 mg/dL   BUN 9  6 - 23 mg/dL   Creatinine, Ser 4.69  0.50 - 1.10 mg/dL   Calcium 9.4  8.4 - 62.9 mg/dL   GFR calc non Af Amer 73 (*) >90 mL/min   GFR calc Af Amer 85 (*) >90 mL/min   Comment: (NOTE)     The eGFR has been calculated using the  CKD EPI equation.     This calculation has not been validated in all clinical situations.     eGFR's persistently <90 mL/min signify possible Chronic Kidney     Disease.  HEPATIC FUNCTION PANEL     Status: None   Collection Time    05/23/13  7:10 PM      Result Value Range   Total Protein 7.0  6.0 - 8.3 g/dL   Albumin 3.8  3.5 - 5.2 g/dL   AST 18  0 - 37 U/L   ALT 8  0 - 35 U/L   Alkaline Phosphatase 63  39 - 117 U/L   Total Bilirubin 0.3  0.3 - 1.2 mg/dL   Bilirubin, Direct <5.2  0.0 - 0.3 mg/dL   Indirect Bilirubin NOT CALCULATED  0.3 - 0.9 mg/dL  CG4 I-STAT (LACTIC ACID)     Status: None   Collection Time    05/23/13  8:31 PM      Result Value Range   Lactic Acid, Venous 0.85  0.5 - 2.2 mmol/L  URINALYSIS, ROUTINE W REFLEX MICROSCOPIC     Status: Abnormal   Collection Time    05/23/13  9:20 PM      Result Value Range   Color, Urine YELLOW  YELLOW   APPearance CLEAR  CLEAR   Comment: CORRECTED RESULTS CALLED TO:     CCONNER RN AT 0110 ON [id number] BY DLONG     CORRECTED ON 12/20 AT 0111: PREVIOUSLY REPORTED AS TURBID   Specific Gravity, Urine 1.037 (*) 1.005 - 1.030   Comment: CORRECTED RESULTS CALLED TO:     CCONNOR RN AT 0110 ON [id number] BY DLONG     CORRECTED ON 12/20 AT 0111: PREVIOUSLY REPORTED AS 1.016   pH 6.0  5.0 - 8.0   Comment: CORRECTED RESULTS CALLED TO:     CCONNOR RN AT 0110 ON [id number] BY DLONG     CORRECTED ON 12/20 AT 0111: PREVIOUSLY REPORTED AS 5.5   Glucose, UA >1000 (*) NEGATIVE mg/dL   Comment: CORRECTED RESULTS CALLED TO:     CCONNOR RN AT 0110 ON [id number] BY DLONG     CORRECTED ON 12/20 AT 0111: PREVIOUSLY REPORTED AS NEGATIVE   Hgb urine dipstick NEGATIVE  NEGATIVE   Comment: CORRECTED RESULTS CALLED TO:     CCONNOR RN AT 0110 ON [id number] BY DLONG     CORRECTED ON 12/20 AT 0111: PREVIOUSLY REPORTED AS MODERATE   Bilirubin Urine NEGATIVE  NEGATIVE   Ketones, ur NEGATIVE  NEGATIVE mg/dL   Protein, ur NEGATIVE  NEGATIVE mg/dL   Comment: CORRECTED RESULTS  CALLED TO:     CCONNOR RN AT 0110 ON [id number] BY DLONG     CORRECTED ON 12/20 AT 0111: PREVIOUSLY REPORTED AS 100   Urobilinogen, UA 0.2  0.0 - 1.0 mg/dL   Nitrite NEGATIVE  NEGATIVE   Leukocytes, UA NEGATIVE  NEGATIVE   Comment: CORRECTED RESULTS CALLED TO:     CCONNOR RN AT 0110 ON [id number] BY DLONG     CORRECTED ON 12/20 AT 0111: PREVIOUSLY REPORTED AS LARGE  URINE MICROSCOPIC-ADD ON     Status: Abnormal   Collection Time    05/23/13  9:20 PM      Result Value Range   Squamous Epithelial / LPF RARE  RARE   Comment: CORRECTED RESULTS CALLED TO:     CCONNOR RN AT 0110 ON 122014 BY DLONG     CORRECTED ON 12/20 AT 0121: PREVIOUSLY REPORTED AS FEW   WBC, UA 3-6  <3 WBC/hpf   Comment: CORRECTED RESULTS CALLED TO:     CCONNOR RN AT 0110 ON [id number] BY DLONG     CORRECTED ON 12/20 AT 0121: PREVIOUSLY REPORTED AS TOO NUMEROUS TO COUNT   RBC / HPF 0-3  <3 RBC/hpf   Comment: CORRECTED RESULTS CALLED TO:     CCONNOR RN AT 0110 ON [id number] BY DLONG     CORRECTED ON 12/20 AT 0121: PREVIOUSLY REPORTED AS 7 10   Bacteria, UA FEW (*) RARE   Comment: CORRECTED RESULTS CALLED TO:     CCONNOR RN AT 0110 ON [id number] BY DLONG     CORRECTED ON 12/20 AT 0121: PREVIOUSLY REPORTED AS MANY  GLUCOSE, CAPILLARY     Status: Abnormal   Collection Time    05/24/13  2:49 AM      Result Value Range   Glucose-Capillary 144 (*) 70 - 99 mg/dL  POTASSIUM     Status: Abnormal   Collection Time    05/24/13  2:50 AM      Result Value Range   Potassium 2.3 (*) 3.5 - 5.1 mEq/L   Comment: CRITICAL RESULT CALLED TO, READ BACK BY AND VERIFIED WITH:     LADKINS RN AT 0331 ON [id number] BY DLONG  MAGNESIUM     Status: None   Collection Time    05/24/13  2:50 AM      Result Value Range   Magnesium 1.8  1.5 - 2.5 mg/dL  PRO B NATRIURETIC PEPTIDE     Status: Abnormal   Collection Time    05/24/13  2:50 AM      Result Value Range   Pro B Natriuretic peptide (BNP) 3915.0 (*) 0 - 450 pg/mL  MRSA PCR SCREENING     Status: None    Collection Time    05/24/13  4:44 AM      Result Value Range   MRSA by PCR NEGATIVE  NEGATIVE   Comment:            The GeneXpert MRSA Assay (FDA     approved for NASAL specimens     only), is one component of a     comprehensive MRSA colonization     surveillance program. It is not     intended to diagnose MRSA     infection nor to guide or     monitor treatment for     MRSA infections.  COMPREHENSIVE METABOLIC  PANEL     Status: Abnormal   Collection Time    05/24/13  5:18 AM      Result Value Range   Sodium 143  135 - 145 mEq/L   Potassium 2.6 (*) 3.5 - 5.1 mEq/L   Comment: CRITICAL RESULT CALLED TO, READ BACK BY AND VERIFIED WITH:     [facility] RN AT 0545 ON 122014 BY DLONG   Chloride 109  96 - 112 mEq/L   CO2 24  19 - 32 mEq/L   Glucose, Bld 143 (*) 70 - 99 mg/dL   BUN 8  6 - 23 mg/dL   Creatinine, Ser 4.78  0.50 - 1.10 mg/dL   Calcium 8.4  8.4 - 29.5 mg/dL   Total Protein 5.9 (*) 6.0 - 8.3 g/dL   Albumin 3.1 (*) 3.5 - 5.2 g/dL   AST 14  0 - 37 U/L   ALT 7  0 - 35 U/L   Alkaline Phosphatase 53  39 - 117 U/L   Total Bilirubin 0.2 (*) 0.3 - 1.2 mg/dL   GFR calc non Af Amer 64 (*) >90 mL/min   GFR calc Af Amer 74 (*) >90 mL/min   Comment: (NOTE)     The eGFR has been calculated using the CKD EPI equation.     This calculation has not been validated in all clinical situations.     eGFR's persistently <90 mL/min signify possible Chronic Kidney     Disease.  MAGNESIUM     Status: None   Collection Time    05/24/13  5:18 AM      Result Value Range   Magnesium 1.8  1.5 - 2.5 mg/dL  CBC WITH DIFFERENTIAL     Status: Abnormal   Collection Time    05/24/13  5:18 AM      Result Value Range   WBC 6.2  4.0 - 10.5 K/uL   RBC 2.62 (*) 3.87 - 5.11 MIL/uL   Hemoglobin 8.7 (*) 12.0 - 15.0 g/dL   HCT 62.1 (*) 30.8 - 65.7 %   MCV 100.0  78.0 - 100.0 fL   MCH 33.2  26.0 - 34.0 pg   MCHC 33.2  30.0 - 36.0 g/dL   RDW 84.6  96.2 - 95.2 %   Platelets 103 (*) 150 - 400 K/uL    Neutrophils Relative % 69  43 - 77 %   Lymphocytes Relative 18  12 - 46 %   Monocytes Relative 12  3 - 12 %   Eosinophils Relative 1  0 - 5 %   Basophils Relative 0  0 - 1 %   Neutro Abs 4.3  1.7 - 7.7 K/uL   Lymphs Abs 1.1  0.7 - 4.0 K/uL   Monocytes Absolute 0.7  0.1 - 1.0 K/uL   Eosinophils Absolute 0.1  0.0 - 0.7 K/uL   Basophils Absolute 0.0  0.0 - 0.1 K/uL   RBC Morphology ELLIPTOCYTES     Smear Review PLATELET COUNT CONFIRMED BY SMEAR     Comment: LARGE PLATELETS PRESENT  GLUCOSE, CAPILLARY     Status: Abnormal   Collection Time    05/24/13  7:17 AM      Result Value Range   Glucose-Capillary 126 (*) 70 - 99 mg/dL    Imaging / Studies: Ct Abdomen Pelvis W Contrast  05/23/2013   CLINICAL DATA:  79 year old female with abdominal and pelvic pain. Nausea and vomiting.  EXAM: CT ABDOMEN AND PELVIS WITH CONTRAST  TECHNIQUE: Multidetector  CT imaging of the abdomen and pelvis was performed using the standard protocol following bolus administration of intravenous contrast.  CONTRAST:  OMNIPAQUE IOHEXOL 300 MG/ML  SOLN  COMPARISON:  05/22/2011 CT and radiographs performed today.  FINDINGS: There is a volvulus of the mid-distal sigmoid colon causing a high-grade obstruction. There is a small amount of fluid within the rectum present.  There is no evidence of pneumoperitoneum. A trace amount of free pelvic fluid is noted.  Horseshoe kidney is again noted with nonobstructing left renal calculus.  Mild intrahepatic biliary fullness is identified.  The spleen and adrenal glands are unremarkable. A 1 x 2 cm hypodense lesion within the pancreatic body is unchanged. There is no evidence of distal pancreatic ductal dilatation.  There is no evidence of enlarged lymph nodes or abdominal aortic aneurysm.  No acute or suspicious bony abnormalities are identified. Left hip replacement is present.  Cardiomegaly and mild bibasilar atelectasis identified.  IMPRESSION: Mid-distal sigmoid colonic volvulus  causing high-grade obstruction. No evidence of pneumoperitoneum.  Horseshoe kidney with nonobstructing left renal calculus.  Unchanged 1 x 2 cm pancreatic body lesion.  Cardiomegaly and mild bibasilar atelectasis.   Electronically Signed   By: Laveda Abbe M.D.   On: 05/23/2013 22:59   Dg Abd Acute W/chest  05/23/2013   CLINICAL DATA:  79 year old female with abdominal pain, distention and vomiting.  EXAM: ACUTE ABDOMEN SERIES (ABDOMEN 2 VIEW & CHEST 1 VIEW)  COMPARISON:  04/19/2013 and prior radiographs dating back to 05/22/2011. 05/22/2011 CT  FINDINGS: Cardiomegaly noted.  Pulmonary vascular congestion is identified.  Patchy opacities/atelectasis within the left lung again noted.  Moderate to severe gaseous distention of the colon is noted. The sigmoid colon measures up to 13.6 cm. A sigmoid obstruction is not excluded although this bowel gas pattern has a somewhat similar appearance to 05/22/2011. .  There is no evidence of pneumoperitoneum.  Surgical clips within the right upper abdomen are noted.  A remote right clavicle fracture and left hip arthroplasty again identified.  IMPRESSION: Gaseous distention of the colon with the sigmoid colon measuring up to 13.6 cm. This could represent a sigmoid obstruction but has a somewhat similar appearance to 05/22/2011. CT may be helpful for further evaluation.  Cardiomegaly with pulmonary vascular congestion and chronic left lung opacities.   Electronically Signed   By: Laveda Abbe M.D.   On: 05/23/2013 21:22    Medications / Allergies: per chart  Antibiotics: Anti-infectives   Start     Dose/Rate Route Frequency Ordered Stop   05/25/13 0300  cefTRIAXone (ROCEPHIN) 1 g in dextrose 5 % 50 mL IVPB     1 g 100 mL/hr over 30 Minutes Intravenous Every 24 hours 05/24/13 0346     05/24/13 0945  metroNIDAZOLE (FLAGYL) IVPB 500 mg     500 mg 100 mL/hr over 60 Minutes Intravenous Every 6 hours 05/24/13 0930     05/24/13 0300  cefTRIAXone (ROCEPHIN) 1 g in dextrose 5 %  50 mL IVPB  Status:  Discontinued     1 g 100 mL/hr over 30 Minutes Intravenous Every 24 hours 05/24/13 0249 05/24/13 0346   05/23/13 2315  cefTRIAXone (ROCEPHIN) 1 g in dextrose 5 % 50 mL IVPB     1 g 100 mL/hr over 30 Minutes Intravenous  Once 05/23/13 2300 05/24/13 4098       Note: This dictation was prepared with Dragon/digital dictation along with [employer]. Any transcriptional errors that result from this process are unintentional.

## 2013-05-25 ENCOUNTER — Inpatient Hospital Stay (HOSPITAL_COMMUNITY): Payer: PRIVATE HEALTH INSURANCE

## 2013-05-25 DIAGNOSIS — M25519 Pain in unspecified shoulder: Secondary | ICD-10-CM | POA: Clinically undetermined

## 2013-05-25 DIAGNOSIS — M19019 Primary osteoarthritis, unspecified shoulder: Secondary | ICD-10-CM | POA: Diagnosis not present

## 2013-05-25 DIAGNOSIS — K562 Volvulus: Secondary | ICD-10-CM | POA: Diagnosis not present

## 2013-05-25 LAB — COMPREHENSIVE METABOLIC PANEL
AST: 14 U/L (ref 0–37)
Albumin: 3 g/dL — ABNORMAL LOW (ref 3.5–5.2)
BUN: 8 mg/dL (ref 6–23)
Chloride: 111 mEq/L (ref 96–112)
Creatinine, Ser: 0.69 mg/dL (ref 0.50–1.10)
GFR calc Af Amer: 89 mL/min — ABNORMAL LOW (ref >90)
GFR calc non Af Amer: 77 mL/min — ABNORMAL LOW (ref >90)
Glucose, Bld: 136 mg/dL — ABNORMAL HIGH (ref 70–99)
Total Bilirubin: 0.2 mg/dL — ABNORMAL LOW (ref 0.3–1.2)

## 2013-05-25 LAB — CBC WITH DIFFERENTIAL/PLATELET
Basophils Absolute: 0 10*3/uL (ref 0.0–0.1)
Basophils Relative: 1 % (ref 0–1)
Eosinophils Relative: 5 % (ref 0–5)
HCT: 25.8 % — ABNORMAL LOW (ref 36.0–46.0)
Hemoglobin: 8.6 g/dL — ABNORMAL LOW (ref 12.0–15.0)
Lymphocytes Relative: 18 % (ref 12–46)
MCH: 33.3 pg (ref 26.0–34.0)
MCHC: 33.3 g/dL (ref 30.0–36.0)
MCV: 100 fL (ref 78.0–100.0)
Neutro Abs: 3.7 10*3/uL (ref 1.7–7.7)
Neutrophils Relative %: 67 % (ref 43–77)
Platelets: 104 10*3/uL — ABNORMAL LOW (ref 150–400)
RDW: 14.2 % (ref 11.5–15.5)
WBC: 5.5 10*3/uL (ref 4.0–10.5)

## 2013-05-25 LAB — MAGNESIUM: Magnesium: 1.7 mg/dL (ref 1.5–2.5)

## 2013-05-25 LAB — GLUCOSE, CAPILLARY
Glucose-Capillary: 96 mg/dL (ref 70–99)
Glucose-Capillary: 119 mg/dL — ABNORMAL HIGH (ref 70–99)

## 2013-05-25 MED ORDER — GABAPENTIN 300 MG PO CAPS
300.0000 mg | ORAL_CAPSULE | Freq: Two times a day (BID) | ORAL | Status: DC
  Administered 2013-05-25 – 2013-06-02 (×17): 300 mg via ORAL
  Filled 2013-05-25 (×19): qty 1

## 2013-05-25 MED ORDER — LORATADINE 10 MG PO TABS
10.0000 mg | ORAL_TABLET | Freq: Every day | ORAL | Status: DC
  Administered 2013-05-25 – 2013-06-02 (×9): 10 mg via ORAL
  Filled 2013-05-25 (×9): qty 1

## 2013-05-25 MED ORDER — POTASSIUM CHLORIDE CRYS ER 20 MEQ PO TBCR
20.0000 meq | EXTENDED_RELEASE_TABLET | Freq: Every day | ORAL | Status: DC
  Administered 2013-05-27: 20 meq via ORAL
  Filled 2013-05-25 (×2): qty 1

## 2013-05-25 MED ORDER — DONEPEZIL HCL 5 MG PO TABS
5.0000 mg | ORAL_TABLET | Freq: Every day | ORAL | Status: DC
  Administered 2013-05-25 – 2013-06-01 (×8): 5 mg via ORAL
  Filled 2013-05-25 (×9): qty 1

## 2013-05-25 MED ORDER — SERTRALINE HCL 50 MG PO TABS
50.0000 mg | ORAL_TABLET | Freq: Every day | ORAL | Status: DC
  Administered 2013-05-25 – 2013-06-01 (×8): 50 mg via ORAL
  Filled 2013-05-25 (×9): qty 1

## 2013-05-25 MED ORDER — MORPHINE SULFATE 2 MG/ML IJ SOLN
1.0000 mg | INTRAMUSCULAR | Status: DC | PRN
  Administered 2013-05-25 – 2013-05-28 (×3): 2 mg via INTRAVENOUS
  Administered 2013-06-02: 1 mg via INTRAVENOUS
  Filled 2013-05-25 (×4): qty 1

## 2013-05-25 MED ORDER — HYDROCODONE-ACETAMINOPHEN 5-325 MG PO TABS
1.0000 | ORAL_TABLET | Freq: Four times a day (QID) | ORAL | Status: DC | PRN
  Administered 2013-05-30: 1 via ORAL
  Filled 2013-05-25 (×2): qty 1

## 2013-05-25 MED ORDER — BISMUTH SUBSALICYLATE 262 MG/15ML PO SUSP
30.0000 mL | Freq: Three times a day (TID) | ORAL | Status: DC | PRN
  Administered 2013-05-25: 30 mL via ORAL
  Filled 2013-05-25: qty 236

## 2013-05-25 MED ORDER — MAGNESIUM SULFATE 40 MG/ML IJ SOLN
2.0000 g | Freq: Once | INTRAMUSCULAR | Status: AC
  Administered 2013-05-25: 09:00:00 2 g via INTRAVENOUS
  Filled 2013-05-25: qty 50

## 2013-05-25 MED ORDER — BOOST / RESOURCE BREEZE PO LIQD
1.0000 | Freq: Three times a day (TID) | ORAL | Status: DC
  Administered 2013-05-25 – 2013-05-30 (×13): 1 via ORAL

## 2013-05-25 MED ORDER — VITAMIN C 500 MG PO TABS
500.0000 mg | ORAL_TABLET | Freq: Every day | ORAL | Status: DC
  Administered 2013-05-25 – 2013-06-02 (×9): 500 mg via ORAL
  Filled 2013-05-25 (×9): qty 1

## 2013-05-25 MED ORDER — ALPRAZOLAM 0.25 MG PO TABS
0.2500 mg | ORAL_TABLET | Freq: Three times a day (TID) | ORAL | Status: DC | PRN
  Administered 2013-05-26 – 2013-05-30 (×2): 0.25 mg via ORAL
  Filled 2013-05-25 (×2): qty 1

## 2013-05-25 MED ORDER — RISAQUAD PO CAPS
1.0000 | ORAL_CAPSULE | Freq: Every day | ORAL | Status: DC
  Administered 2013-05-25 – 2013-06-02 (×9): 1 via ORAL
  Filled 2013-05-25 (×11): qty 1

## 2013-05-25 MED ORDER — ACETAMINOPHEN 500 MG PO TABS: 1000.0000 mg | ORAL_TABLET | Freq: Three times a day (TID) | ORAL | Status: DC

## 2013-05-25 MED ORDER — AMLODIPINE BESYLATE 5 MG PO TABS
5.0000 mg | ORAL_TABLET | Freq: Every day | ORAL | Status: DC
  Administered 2013-05-26 – 2013-06-02 (×8): 5 mg via ORAL
  Filled 2013-05-25 (×8): qty 1

## 2013-05-25 MED ORDER — POTASSIUM CHLORIDE 20 MEQ/15ML (10%) PO LIQD
40.0000 meq | ORAL | Status: AC
  Administered 2013-05-25 (×3): 40 meq via ORAL
  Filled 2013-05-25 (×3): qty 30

## 2013-05-25 MED ORDER — CEFTRIAXONE SODIUM 1 G IJ SOLR
1.0000 g | INTRAMUSCULAR | Status: DC
  Administered 2013-05-26 – 2013-05-27 (×2): 1 g via INTRAVENOUS
  Filled 2013-05-25 (×2): qty 10

## 2013-05-25 MED ORDER — POTASSIUM CHLORIDE CRYS ER 20 MEQ PO TBCR
20.0000 meq | EXTENDED_RELEASE_TABLET | Freq: Every day | ORAL | Status: DC
  Filled 2013-05-25: qty 1

## 2013-05-25 MED ORDER — MAGNESIUM SULFATE 40 MG/ML IJ SOLN
2.0000 g | Freq: Once | INTRAMUSCULAR | Status: AC
  Administered 2013-05-25: 16:00:00 2 g via INTRAVENOUS
  Filled 2013-05-25: qty 50

## 2013-05-25 MED ORDER — ACETAMINOPHEN 325 MG PO TABS
650.0000 mg | ORAL_TABLET | Freq: Three times a day (TID) | ORAL | Status: DC
  Administered 2013-05-25 – 2013-06-02 (×25): 650 mg via ORAL
  Filled 2013-05-25 (×28): qty 2

## 2013-05-25 MED ORDER — DICLOFENAC SODIUM 1 % TD GEL
2.0000 g | Freq: Four times a day (QID) | TRANSDERMAL | Status: DC
  Administered 2013-05-25 – 2013-06-02 (×35): 2 g via TOPICAL
  Filled 2013-05-25: qty 100

## 2013-05-25 MED ORDER — PANTOPRAZOLE SODIUM 40 MG PO TBEC
40.0000 mg | DELAYED_RELEASE_TABLET | Freq: Every day | ORAL | Status: DC
  Administered 2013-05-25 – 2013-06-02 (×9): 40 mg via ORAL
  Filled 2013-05-25 (×9): qty 1

## 2013-05-25 NOTE — Progress Notes (Signed)
[name] dropped down to high 30s twice this AM while patient asleep. Low HR did not sustain. Vitals stable, patient asymptomatic. Patient is now in a-fib in the 60s-70s. Maria Lennox, MD notified. Patient's IV Lopressor dc'd. Will continue to monitor patient.

## 2013-05-25 NOTE — Progress Notes (Signed)
Patient ID: Maria Christian, female   DOB: 07/22/1924, 79 y.o.   MRN: [id number] [facility] Progress Note  Subjective: IN good spirits and very appreciative  of care. Hungry, denies any abdominal pain- no Bm;s this morning, had diarrhea last night KUB- 12/20 - mild gaseous distention and persistent volvulus pattern  Objective:  Vital signs in last 24 hours: Temp:  [98.1 F (36.7 C)-99.7 F (37.6 C)] 99.7 F (37.6 C) (12/21 0356) Pulse Rate:  [81-87] 81 (12/21 0356) Resp:  [16-20] 20 (12/21 0356) BP: (124-152)/(48-67) 152/67 mmHg (12/21 0356) SpO2:  [95 %-98 %] 95 % (12/21 0356) Weight:  [162 lb 11.2 oz (73.8 kg)] 162 lb 11.2 oz (73.8 kg) (12/21 0403) Last BM Date: 05/24/13 General:   Alert,  Well-developed, Elderly HOH   in NAD Heart:  Regular rate and rhythm; no murmurs Pulm;clear Abdomen:  Soft, nontender , some increased distention this am over yesterday am- BS somewhat high pitched and tinkling Extremities:  Without edema. Neurologic:  Alert and  oriented x4;  grossly normal neurologically. Psych:  Alert and cooperative. Normal mood and affect.  Intake/Output from previous day: 12/20 0701 - 12/21 0700 In: 1915 [P.O.:480; I.V.:1435] Out: 0  Intake/Output this shift:    Lab Results:  Recent Labs  05/23/13 1910 05/24/13 0518 05/25/13 0656  WBC 6.5 6.2 5.5  HGB 10.1* 8.7* 8.6*  HCT 30.6* 26.2* 25.8*  PLT 132* 103* 104*   BMET  Recent Labs  05/24/13 0518 05/24/13 0920 05/25/13 0656  NA 143 145 142  K 2.6* 2.8* 2.4*  CL 109 111 111  CO2 24 27 21  GLUCOSE 143* 86 136*  BUN 8 9 8  CREATININE 0.81 0.85 0.69  CALCIUM 8.4 8.6 8.6   LFT  Recent Labs  05/23/13 1910  05/25/13 0656  PROT 7.0  < > 5.8*  ALBUMIN 3.8  < > 3.0*  AST 18  < > 14  ALT 8  < > 6  ALKPHOS 63  < > 55  BILITOT 0.3  < > 0.2*  BILIDIR <0.1  --   --   IBILI NOT CALCULATED  --   --   < > = values in this interval not displayed.    Assessment / Plan: #1  79 yo female  s/p decompression of sigmoid volvulus 12/20-  Suspect  Volvulus has recurred- not symptomatic at present #2  Severe hypokalemia- being replaced, as is MG #3 parkinsons #5 CAD  Continue liquid diet Repeat abdominal films today Can reattempt colonoscopic decompression if needed by if recurring may need surgery Active Problems:   Atrial fibrillation with controlled ventricular response   Diastolic CHF, chronic   Parkinson disease   Hypertension   Hyperlipemia   Diabetes mellitus   Coronary artery disease   Thrombocytopenia   TIA (transient ischemic attack)   Dementia   Depression   Sigmoid volvulus   Pulmonary hypertension   Sacral decubitus ulcer   HOH (hard of hearing)   Pain in right shoulder joint     LOS: 2 days   [name]  05/25/2013, 9:30 AM    

## 2013-05-25 NOTE — Progress Notes (Signed)
INITIAL NUTRITION ASSESSMENT  DOCUMENTATION CODES Per approved criteria  -Not Applicable   INTERVENTION: Resource Breeze tid Monitor diet advancement.  NUTRITION DIAGNOSIS: Inadequate oral intake related to altered gi function as evidenced by volvulous.   Goal: Tolerate diet advancement.  Meet >75% estimated needs with meals and supplements  Monitor:  Diet advancement, labs, weight, intake  Reason for Assessment: mst  79 y.o. female  Admitting Dx: <principal problem not specified>  Assessment: #1 79 yo female s/p decompression of sigmoid volvulus 12/20- Suspect Volvulus has recurred- not symptomatic at present  #2 Severe hypokalemia- being replaced, as is MG  #3 parkinsons  #5 CAD  Patient is very hard of hearing.  Taking small amounts of a full liquid diet with continued diarrhea per nurse.    Height: Ht Readings from Last 1 Encounters:  05/24/13 5\' 5"  (1.651 m)    Weight: Wt Readings from Last 1 Encounters:  05/25/13 162 lb 11.2 oz (73.8 kg)    Ideal Body Weight: 125 lbs  % Ideal Body Weight: 130  Wt Readings from Last 10 Encounters:  05/25/13 162 lb 11.2 oz (73.8 kg)  05/25/13 162 lb 11.2 oz (73.8 kg)  02/14/13 152 lb (68.947 kg)  12/19/12 153 lb 9.6 oz (69.673 kg)  08/01/12 169 lb (76.658 kg)  08/01/12 169 lb (76.658 kg)  07/13/11 169 lb 12.1 oz (77 kg)  05/24/11 164 lb 10.9 oz (74.7 kg)    Usual Body Weight: 152 lbs   % Usual Body Weight: 107  BMI:  Body mass index is 27.07 kg/(m^2).  Estimated Nutritional Needs: Kcal: 1600-1700 Protein: 70-80 gm Fluid: >1.7L  Skin: intact  Diet Order: Full Liquid  EDUCATION NEEDS: -No education needs identified at this time   Intake/Output Summary (Last 24 hours) at 05/25/13 1613 Last data filed at 05/25/13 0700  Gross per 24 hour  Intake   1280 ml  Output      0 ml  Net   1280 ml    Last BM: 12/21 diarrhea.   Labs:   Recent Labs Lab 05/24/13 0250 05/24/13 0518 05/24/13 0920  05/25/13 0656  NA  --  143 145 142  K 2.3* 2.6* 2.8* 2.4*  CL  --  109 111 111  CO2  --  24 27 21   BUN  --  8 9 8   CREATININE  --  0.81 0.85 0.69  CALCIUM  --  8.4 8.6 8.6  MG 1.8 1.8  --  1.7  GLUCOSE  --  143* 86 136*    CBG (last 3)   Recent Labs  05/24/13 2334 05/25/13 0340 05/25/13 0729  GLUCAP 82 96 120*    Scheduled Meds: . acetaminophen  650 mg Oral TID  . acidophilus  1 capsule Oral Daily  . [START ON 05/26/2013] amLODipine  5 mg Oral Daily  . aspirin EC  81 mg Oral Daily  . [START ON 05/26/2013] cefTRIAXone (ROCEPHIN)  IV  1 g Intravenous Q24H  . diclofenac sodium  2 g Topical QID  . donepezil  5 mg Oral QHS  . dorzolamide  1 drop Both Eyes TID  . enoxaparin (LOVENOX) injection  40 mg Subcutaneous Q24H  . gabapentin  300 mg Oral BID  . insulin aspart  0-15 Units Subcutaneous Q4H  . ketotifen  1 drop Both Eyes BID  . latanoprost  1 drop Both Eyes QHS  . lip balm  1 application Topical BID  . loratadine  10 mg Oral Daily  . magnesium  sulfate 1 - 4 g bolus IVPB  2 g Intravenous Once  . pantoprazole  40 mg Oral Daily  . [START ON 05/27/2013] potassium chloride SA  20 mEq Oral Daily  . sertraline  50 mg Oral QHS  . vitamin C  500 mg Oral Daily    Continuous Infusions: . sodium chloride 50 mL/hr at 05/24/13 2138    Past Medical History  Diagnosis Date  . Atrial fibrillation   . Altered mental status   . Encephalopathy   . Parkinson disease   . Hypertension   . GERD (gastroesophageal reflux disease)   . Hyperlipemia   . Diabetes mellitus   . Coronary artery disease   . History of adenomatous polyp of colon 06/24/99  . Enlarged heart   . DEMENTIA   . Edema of lower extremity 07/13/11    right leg more swollen than left leg  . Esophageal dysmotility 07/02/12  . Horseshoe kidney   . Pancreatic lesion 05/22/11    no further workup  per PCP/family due to age  . Anemia   . Peripheral neuropathy   . Depression   . Thrombophlebitis     Past Surgical  History  Procedure Laterality Date  . Shoulder surgery  2001    left clavicle excision and acromioplasty  . Abdominal hysterectomy    . Cholecystectomy    . Total hip arthroplasty    . Breast surgery      2 benign tumors removed left breast  . Foot surgery      benign tumors from foot  . Esophageal dilation      several times by Dr. Victorino Dike  . Nose surgery    . Esophagogastroduodenoscopy (egd) with esophageal dilation N/A 08/01/2012    Procedure: ESOPHAGOGASTRODUODENOSCOPY (EGD) WITH ESOPHAGEAL DILATION;  Surgeon: Hart Carwin, MD;  Location: [facility];  Service: Endoscopy;  Laterality: N/A;  with c-arm savory dilators   Oran Rein, RD, LDN Clinical Inpatient Dietitian Pager:  [phone] Weekend and after hours pager:  [phone]

## 2013-05-25 NOTE — Progress Notes (Signed)
Patient seen, examined, and I agree with the above documentation, including the assessment and plan. Imaging suggests slowly recurring volvulus pattern, though her abd is at this point, much softer than on initial presentation Will plan repeat flex sig for decompression tomorrow, which I expect will only be temporizing Surgery, though high risk given age and comorbidity, may ultimately be the only definitive, more lasting, therapy  

## 2013-05-25 NOTE — Progress Notes (Signed)
CRITICAL VALUE ALERT  Critical value received:  Potassium 2.4  Date of notification:  05/25/13  Time of notification:  0743  Critical value read back:yes  Nurse who received alert:  Farley Ly RN   MD notified (1st page):  Gherghe  Time of first page:  [phone]  MD notified (2nd page):  Time of second page:  Responding MD:  Elvera Lennox  Time MD responded:  [phone]

## 2013-05-25 NOTE — Progress Notes (Signed)
Maria Christian [id number] September 24, 1924  CARE TEAM:  PCP: Karlene Einstein, MD  Outpatient Care Team: Patient Care Team: Angela Cox, MD as PCP - General (Internal Medicine)  Inpatient Treatment Team: Treatment Team: Attending Provider: Leatha Gilding, MD; Consulting Physician: Bishop Limbo, MD; Rounding Team: Alton Revere, MD; Registered Nurse: Erick Colace, RN; Registered Nurse: Carmelina Peal, RN; Consulting Physician: Beverley Fiedler, MD; Registered Nurse: Earnestine Leys, RN; Registered Nurse: Jobe Gibbon, RN   Subjective:  Denies abd pain Notes diarrhea C/o right shoulder pain Hearing Aids not works - says daughter bringing new batteries Frustrated  Objective:  Vital signs:  Filed Vitals:   05/24/13 1411 05/24/13 2105 05/25/13 0356 05/25/13 0403  BP: 124/48 125/50 152/67   Pulse: 85 87 81   Temp: 98.2 F (36.8 C) 98.1 F (36.7 C) 99.7 F (37.6 C)   TempSrc: Oral Oral Oral   Resp: 16 18 20    Height:      Weight:    162 lb 11.2 oz (73.8 kg)  SpO2: 98% 98% 95%     Last BM Date: 05/24/13  Intake/Output   Yesterday:  12/20 0701 - 12/21 0700 In: 1915 [P.O.:480; I.V.:1435] Out: 0  This shift:     Bowel function:  Flatus: n  BM: Many loose  Drain: Scant clear return.  Flushes OK  Physical Exam:  General: Pt awake/alert/oriented x2 in no acute distress Eyes: PERRL, normal EOM.  Sclera clear.  No icterus Neuro: CN II-XII intact w/o focal sensory/motor deficits. Lymph: No head/neck/groin lymphadenopathy Psych:  No delerium/psychosis/paranoia HENT: Normocephalic, Mucus membranes moist.  No thrush Neck: Supple, No tracheal deviation Chest: No chest wall pain w good excursion CV:  Pulses intact.  Regular rhythm MS: Normal AROM mjr joints.  No obvious deformity.  R shoulder sore but no point TTP/crepitus  Abdomen: Soft.  Mildly distended.  NONtender.  No evidence of peritonitis.  No incarcerated hernias. Ext:  SCDs BLE.  No mjr edema.  No  cyanosis Skin: No petechiae / purpura   Problem List:   Active Problems:   Atrial fibrillation with controlled ventricular response   Diastolic CHF, chronic   Parkinson disease   Hypertension   Hyperlipemia   Diabetes mellitus   Coronary artery disease   Thrombocytopenia   TIA (transient ischemic attack)   Dementia   Depression   Sigmoid volvulus   Pulmonary hypertension   Sacral decubitus ulcer   HOH (hard of hearing)   Pain in right shoulder joint   Assessment  Maria Christian  79 y.o. female  1 Day Post-Op  Procedure(s): FLEXIBLE SIGMOIDOSCOPY  Stabilizing s/p untwisting of sigmoid volvulus  Plan:  -adv diet -bowel regimen -try to get hearing aids to work -low K = replacing.  Add Mg replacement -restart dementia/anxiety/depression regimen -voltaren/tylenol/heat for shoulder.  If worse consider Xray - defer to primary service -VTE prophylaxis- SCDs, etc -mobilize as tolerated to help recovery  Try to avoid sigmoid colectomy given adv age & comorbidities.  D/w pt & DR Elvera Lennox with Int Med  Ardeth Sportsman, M.D., F.A.C.S. [facility] [address] [place], [place] [address] [phone] Main / Paging   05/25/2013   Results:   Labs: Results for orders placed during the hospital encounter of 05/23/13 (from the past 48 hour(s))  CBC     Status: Abnormal   Collection Time    05/23/13  7:10 PM  Result Value Range   WBC 6.5  4.0 - 10.5 K/uL   RBC 3.07 (*) 3.87 - 5.11 MIL/uL   Hemoglobin 10.1 (*) 12.0 - 15.0 g/dL   HCT 16.1 (*) 09.6 - 04.5 %   MCV 99.7  78.0 - 100.0 fL   MCH 32.9  26.0 - 34.0 pg   MCHC 33.0  30.0 - 36.0 g/dL   RDW 40.9  81.1 - 91.4 %   Platelets 132 (*) 150 - 400 K/uL  BASIC METABOLIC PANEL     Status: Abnormal   Collection Time    05/23/13  7:10 PM      Result Value Range   Sodium 146 (*) 135 - 145 mEq/L   Potassium 2.5 (*) 3.5 - 5.1 mEq/L    Comment: CRITICAL RESULT CALLED TO, READ BACK BY AND VERIFIED WITH:     L ADKINS RN 2128 05/23/13 A NAVARRO   Chloride 107  96 - 112 mEq/L   CO2 25  19 - 32 mEq/L   Glucose, Bld 164 (*) 70 - 99 mg/dL   BUN 9  6 - 23 mg/dL   Creatinine, Ser 7.82  0.50 - 1.10 mg/dL   Calcium 9.4  8.4 - 95.6 mg/dL   GFR calc non Af Amer 73 (*) >90 mL/min   GFR calc Af Amer 85 (*) >90 mL/min   Comment: (NOTE)     The eGFR has been calculated using the CKD EPI equation.     This calculation has not been validated in all clinical situations.     eGFR's persistently <90 mL/min signify possible Chronic Kidney     Disease.  HEPATIC FUNCTION PANEL     Status: None   Collection Time    05/23/13  7:10 PM      Result Value Range   Total Protein 7.0  6.0 - 8.3 g/dL   Albumin 3.8  3.5 - 5.2 g/dL   AST 18  0 - 37 U/L   ALT 8  0 - 35 U/L   Alkaline Phosphatase 63  39 - 117 U/L   Total Bilirubin 0.3  0.3 - 1.2 mg/dL   Bilirubin, Direct <2.1  0.0 - 0.3 mg/dL   Indirect Bilirubin NOT CALCULATED  0.3 - 0.9 mg/dL  CG4 I-STAT (LACTIC ACID)     Status: None   Collection Time    05/23/13  8:31 PM      Result Value Range   Lactic Acid, Venous 0.85  0.5 - 2.2 mmol/L  URINALYSIS, ROUTINE W REFLEX MICROSCOPIC     Status: Abnormal   Collection Time    05/23/13  9:20 PM      Result Value Range   Color, Urine YELLOW  YELLOW   APPearance CLEAR  CLEAR   Comment: CORRECTED RESULTS CALLED TO:     CCONNER RN AT 0110 ON 122014 BY DLONG     CORRECTED ON 12/20 AT 0111: PREVIOUSLY REPORTED AS TURBID   Specific Gravity, Urine 1.037 (*) 1.005 - 1.030   Comment: CORRECTED RESULTS CALLED TO:     CCONNOR RN AT 0110 ON 122014 BY DLONG     CORRECTED ON 12/20 AT 0111: PREVIOUSLY REPORTED AS 1.016   pH 6.0  5.0 - 8.0   Comment: CORRECTED RESULTS CALLED TO:     CCONNOR RN AT 0110 ON [id number] BY DLONG     CORRECTED ON 12/20 AT 0111: PREVIOUSLY REPORTED AS 5.5   Glucose, UA >1000 (*) NEGATIVE mg/dL   Comment: CORRECTED  RESULTS CALLED TO:      CCONNOR RN AT 0110 ON [id number] BY DLONG     CORRECTED ON 12/20 AT 0111: PREVIOUSLY REPORTED AS NEGATIVE   Hgb urine dipstick NEGATIVE  NEGATIVE   Comment: CORRECTED RESULTS CALLED TO:     CCONNOR RN AT 0110 ON [id number] BY DLONG     CORRECTED ON 12/20 AT 0111: PREVIOUSLY REPORTED AS MODERATE   Bilirubin Urine NEGATIVE  NEGATIVE   Ketones, ur NEGATIVE  NEGATIVE mg/dL   Protein, ur NEGATIVE  NEGATIVE mg/dL   Comment: CORRECTED RESULTS CALLED TO:     CCONNOR RN AT 0110 ON [id number] BY DLONG     CORRECTED ON 12/20 AT 0111: PREVIOUSLY REPORTED AS 100   Urobilinogen, UA 0.2  0.0 - 1.0 mg/dL   Nitrite NEGATIVE  NEGATIVE   Leukocytes, UA NEGATIVE  NEGATIVE   Comment: CORRECTED RESULTS CALLED TO:     CCONNOR RN AT 0110 ON 122014 BY DLONG     CORRECTED ON 12/20 AT 0111: PREVIOUSLY REPORTED AS LARGE  URINE MICROSCOPIC-ADD ON     Status: Abnormal   Collection Time    05/23/13  9:20 PM      Result Value Range   Squamous Epithelial / LPF RARE  RARE   Comment: CORRECTED RESULTS CALLED TO:     CCONNOR RN AT 0110 ON 122014 BY DLONG     CORRECTED ON 12/20 AT 0121: PREVIOUSLY REPORTED AS FEW   WBC, UA 3-6  <3 WBC/hpf   Comment: CORRECTED RESULTS CALLED TO:     CCONNOR RN AT 0110 ON [id number] BY DLONG     CORRECTED ON 12/20 AT 0121: PREVIOUSLY REPORTED AS TOO NUMEROUS TO COUNT   RBC / HPF 0-3  <3 RBC/hpf   Comment: CORRECTED RESULTS CALLED TO:     CCONNOR RN AT 0110 ON [id number] BY DLONG     CORRECTED ON 12/20 AT 0121: PREVIOUSLY REPORTED AS 7 10   Bacteria, UA FEW (*) RARE   Comment: CORRECTED RESULTS CALLED TO:     CCONNOR RN AT 0110 ON [id number] BY DLONG     CORRECTED ON 12/20 AT 0121: PREVIOUSLY REPORTED AS MANY  GLUCOSE, CAPILLARY     Status: Abnormal   Collection Time    05/24/13  2:49 AM      Result Value Range   Glucose-Capillary 144 (*) 70 - 99 mg/dL  POTASSIUM     Status: Abnormal   Collection Time    05/24/13  2:50 AM      Result Value Range   Potassium 2.3 (*) 3.5 - 5.1 mEq/L   Comment:  CRITICAL RESULT CALLED TO, READ BACK BY AND VERIFIED WITH:     LADKINS RN AT 0331 ON 122014 BY DLONG  MAGNESIUM     Status: None   Collection Time    05/24/13  2:50 AM      Result Value Range   Magnesium 1.8  1.5 - 2.5 mg/dL  HEMOGLOBIN G2X     Status: Abnormal   Collection Time    05/24/13  2:50 AM      Result Value Range   Hemoglobin A1C 6.4 (*) <5.7 %   Comment: (NOTE)  According to the ADA Clinical Practice Recommendations for 2011, when     HbA1c is used as a screening test:      >=6.5%   Diagnostic of Diabetes Mellitus               (if abnormal result is confirmed)     5.7-6.4%   Increased risk of developing Diabetes Mellitus     References:Diagnosis and Classification of Diabetes Mellitus,Diabetes     Care,2011,34(Suppl 1):S62-S69 and Standards of Medical Care in             Diabetes - 2011,Diabetes Care,2011,34 (Suppl 1):S11-S61.   Mean Plasma Glucose 137 (*) <117 mg/dL   Comment: Performed at [facility]  PRO B NATRIURETIC PEPTIDE     Status: Abnormal   Collection Time    05/24/13  2:50 AM      Result Value Range   Pro B Natriuretic peptide (BNP) 3915.0 (*) 0 - 450 pg/mL  MRSA PCR SCREENING     Status: None   Collection Time    05/24/13  4:44 AM      Result Value Range   MRSA by PCR NEGATIVE  NEGATIVE   Comment:            The GeneXpert MRSA Assay (FDA     approved for NASAL specimens     only), is one component of a     comprehensive MRSA colonization     surveillance program. It is not     intended to diagnose MRSA     infection nor to guide or     monitor treatment for     MRSA infections.  COMPREHENSIVE METABOLIC PANEL     Status: Abnormal   Collection Time    05/24/13  5:18 AM      Result Value Range   Sodium 143  135 - 145 mEq/L   Potassium 2.6 (*) 3.5 - 5.1 mEq/L   Comment: CRITICAL RESULT CALLED TO, READ BACK BY AND VERIFIED WITH:     [facility] RN AT 0545 ON 122014  BY DLONG   Chloride 109  96 - 112 mEq/L   CO2 24  19 - 32 mEq/L   Glucose, Bld 143 (*) 70 - 99 mg/dL   BUN 8  6 - 23 mg/dL   Creatinine, Ser 9.52  0.50 - 1.10 mg/dL   Calcium 8.4  8.4 - 84.1 mg/dL   Total Protein 5.9 (*) 6.0 - 8.3 g/dL   Albumin 3.1 (*) 3.5 - 5.2 g/dL   AST 14  0 - 37 U/L   ALT 7  0 - 35 U/L   Alkaline Phosphatase 53  39 - 117 U/L   Total Bilirubin 0.2 (*) 0.3 - 1.2 mg/dL   GFR calc non Af Amer 64 (*) >90 mL/min   GFR calc Af Amer 74 (*) >90 mL/min   Comment: (NOTE)     The eGFR has been calculated using the CKD EPI equation.     This calculation has not been validated in all clinical situations.     eGFR's persistently <90 mL/min signify possible Chronic Kidney     Disease.  MAGNESIUM     Status: None   Collection Time    05/24/13  5:18 AM      Result Value Range   Magnesium 1.8  1.5 - 2.5 mg/dL  CBC WITH DIFFERENTIAL     Status: Abnormal   Collection Time    05/24/13  5:18 AM  Result Value Range   WBC 6.2  4.0 - 10.5 K/uL   RBC 2.62 (*) 3.87 - 5.11 MIL/uL   Hemoglobin 8.7 (*) 12.0 - 15.0 g/dL   HCT 29.5 (*) 62.1 - 30.8 %   MCV 100.0  78.0 - 100.0 fL   MCH 33.2  26.0 - 34.0 pg   MCHC 33.2  30.0 - 36.0 g/dL   RDW 65.7  84.6 - 96.2 %   Platelets 103 (*) 150 - 400 K/uL   Neutrophils Relative % 69  43 - 77 %   Lymphocytes Relative 18  12 - 46 %   Monocytes Relative 12  3 - 12 %   Eosinophils Relative 1  0 - 5 %   Basophils Relative 0  0 - 1 %   Neutro Abs 4.3  1.7 - 7.7 K/uL   Lymphs Abs 1.1  0.7 - 4.0 K/uL   Monocytes Absolute 0.7  0.1 - 1.0 K/uL   Eosinophils Absolute 0.1  0.0 - 0.7 K/uL   Basophils Absolute 0.0  0.0 - 0.1 K/uL   RBC Morphology ELLIPTOCYTES     Smear Review PLATELET COUNT CONFIRMED BY SMEAR     Comment: LARGE PLATELETS PRESENT  GLUCOSE, CAPILLARY     Status: Abnormal   Collection Time    05/24/13  7:17 AM      Result Value Range   Glucose-Capillary 126 (*) 70 - 99 mg/dL  BASIC METABOLIC PANEL     Status: Abnormal    Collection Time    05/24/13  9:20 AM      Result Value Range   Sodium 145  135 - 145 mEq/L   Potassium 2.8 (*) 3.5 - 5.1 mEq/L   Chloride 111  96 - 112 mEq/L   CO2 27  19 - 32 mEq/L   Glucose, Bld 86  70 - 99 mg/dL   BUN 9  6 - 23 mg/dL   Creatinine, Ser 9.52  0.50 - 1.10 mg/dL   Calcium 8.6  8.4 - 84.1 mg/dL   GFR calc non Af Amer 60 (*) >90 mL/min   GFR calc Af Amer 70 (*) >90 mL/min   Comment: (NOTE)     The eGFR has been calculated using the CKD EPI equation.     This calculation has not been validated in all clinical situations.     eGFR's persistently <90 mL/min signify possible Chronic Kidney     Disease.  GLUCOSE, CAPILLARY     Status: None   Collection Time    05/24/13 11:18 AM      Result Value Range   Glucose-Capillary 83  70 - 99 mg/dL  GLUCOSE, CAPILLARY     Status: Abnormal   Collection Time    05/24/13  4:50 PM      Result Value Range   Glucose-Capillary 105 (*) 70 - 99 mg/dL  GLUCOSE, CAPILLARY     Status: Abnormal   Collection Time    05/24/13  8:12 PM      Result Value Range   Glucose-Capillary 140 (*) 70 - 99 mg/dL  GLUCOSE, CAPILLARY     Status: None   Collection Time    05/24/13 11:34 PM      Result Value Range   Glucose-Capillary 82  70 - 99 mg/dL  GLUCOSE, CAPILLARY     Status: None   Collection Time    05/25/13  3:40 AM      Result Value Range   Glucose-Capillary 96  70 -  99 mg/dL  COMPREHENSIVE METABOLIC PANEL     Status: Abnormal   Collection Time    05/25/13  6:56 AM      Result Value Range   Sodium 142  135 - 145 mEq/L   Potassium 2.4 (*) 3.5 - 5.1 mEq/L   Comment: CRITICAL RESULT CALLED TO, READ BACK BY AND VERIFIED WITH:     Liliana Cline RN AT 0740 ON 12.21.14 BY SHUEA   Chloride 111  96 - 112 mEq/L   CO2 21  19 - 32 mEq/L   Glucose, Bld 136 (*) 70 - 99 mg/dL   BUN 8  6 - 23 mg/dL   Creatinine, Ser 1.61  0.50 - 1.10 mg/dL   Calcium 8.6  8.4 - 09.6 mg/dL   Total Protein 5.8 (*) 6.0 - 8.3 g/dL   Albumin 3.0 (*) 3.5 - 5.2 g/dL   AST  14  0 - 37 U/L   ALT 6  0 - 35 U/L   Alkaline Phosphatase 55  39 - 117 U/L   Total Bilirubin 0.2 (*) 0.3 - 1.2 mg/dL   GFR calc non Af Amer 77 (*) >90 mL/min   GFR calc Af Amer 89 (*) >90 mL/min   Comment: (NOTE)     The eGFR has been calculated using the CKD EPI equation.     This calculation has not been validated in all clinical situations.     eGFR's persistently <90 mL/min signify possible Chronic Kidney     Disease.  MAGNESIUM     Status: None   Collection Time    05/25/13  6:56 AM      Result Value Range   Magnesium 1.7  1.5 - 2.5 mg/dL  CBC WITH DIFFERENTIAL     Status: Abnormal   Collection Time    05/25/13  6:56 AM      Result Value Range   WBC 5.5  4.0 - 10.5 K/uL   RBC 2.58 (*) 3.87 - 5.11 MIL/uL   Hemoglobin 8.6 (*) 12.0 - 15.0 g/dL   HCT 04.5 (*) 40.9 - 81.1 %   MCV 100.0  78.0 - 100.0 fL   MCH 33.3  26.0 - 34.0 pg   MCHC 33.3  30.0 - 36.0 g/dL   RDW 91.4  78.2 - 95.6 %   Platelets 104 (*) 150 - 400 K/uL   Comment: CONSISTENT WITH PREVIOUS RESULT   Neutrophils Relative % 67  43 - 77 %   Neutro Abs 3.7  1.7 - 7.7 K/uL   Lymphocytes Relative 18  12 - 46 %   Lymphs Abs 1.0  0.7 - 4.0 K/uL   Monocytes Relative 10  3 - 12 %   Monocytes Absolute 0.6  0.1 - 1.0 K/uL   Eosinophils Relative 5  0 - 5 %   Eosinophils Absolute 0.3  0.0 - 0.7 K/uL   Basophils Relative 1  0 - 1 %   Basophils Absolute 0.0  0.0 - 0.1 K/uL  GLUCOSE, CAPILLARY     Status: Abnormal   Collection Time    05/25/13  7:29 AM      Result Value Range   Glucose-Capillary 120 (*) 70 - 99 mg/dL   Comment 1 Notify RN      Imaging / Studies: Ct Abdomen Pelvis W Contrast  05/23/2013   CLINICAL DATA:  79 year old female with abdominal and pelvic pain. Nausea and vomiting.  EXAM: CT ABDOMEN AND PELVIS WITH CONTRAST  TECHNIQUE: Multidetector CT imaging of  the abdomen and pelvis was performed using the standard protocol following bolus administration of intravenous contrast.  CONTRAST:  OMNIPAQUE  IOHEXOL 300 MG/ML  SOLN  COMPARISON:  05/22/2011 CT and radiographs performed today.  FINDINGS: There is a volvulus of the mid-distal sigmoid colon causing a high-grade obstruction. There is a small amount of fluid within the rectum present.  There is no evidence of pneumoperitoneum. A trace amount of free pelvic fluid is noted.  Horseshoe kidney is again noted with nonobstructing left renal calculus.  Mild intrahepatic biliary fullness is identified.  The spleen and adrenal glands are unremarkable. A 1 x 2 cm hypodense lesion within the pancreatic body is unchanged. There is no evidence of distal pancreatic ductal dilatation.  There is no evidence of enlarged lymph nodes or abdominal aortic aneurysm.  No acute or suspicious bony abnormalities are identified. Left hip replacement is present.  Cardiomegaly and mild bibasilar atelectasis identified.  IMPRESSION: Mid-distal sigmoid colonic volvulus causing high-grade obstruction. No evidence of pneumoperitoneum.  Horseshoe kidney with nonobstructing left renal calculus.  Unchanged 1 x 2 cm pancreatic body lesion.  Cardiomegaly and mild bibasilar atelectasis.   Electronically Signed   By: Laveda Abbe M.D.   On: 05/23/2013 22:59   Dg Abd 2 Views  05/24/2013   CLINICAL DATA:  Abdominal pain, history of sigmoid volvulus.  EXAM: ABDOMEN - 2 VIEW, supine and left side down decubitus film  COMPARISON:  CT scan of the abdomen and pelvis dated May 23, 2013.  FINDINGS: There remains a loop of moderately distended gas-filled of bowel in the mid to lower abdomen and upper pelvis consistent with clinically suspected sigmoid volvulus. Proximal to this the colon does not appear abnormally distended. There is contrast within the urinary bladder.  The esophagogastric tube tip is at the level of the GE junction with the proximal port is above the GE junction in the distal thoracic esophagus. Advancement by at least 20 cm is recommended. There are surgical clips in the gallbladder  fossa. There is a prosthetic left hip joint.  IMPRESSION: 1. The esophagogastric tube position is inadequate and advancement by 20 cm is recommended to assure that the proximal port lies below the GE junction. 2. There remains mild gaseous distention of a curvilinear loop of bowel in the lower abdomen and upper pelvis consistent with the clinically suspected sigmoid volvulus. The degree of colonic distention with gas and stool appears somewhat less on today's abdominal series when compared to the earlier CT scan.   Electronically Signed   By: David  [place]   On: 05/24/2013 14:11   Dg Abd Acute W/chest  05/23/2013   CLINICAL DATA:  79 year old female with abdominal pain, distention and vomiting.  EXAM: ACUTE ABDOMEN SERIES (ABDOMEN 2 VIEW & CHEST 1 VIEW)  COMPARISON:  04/19/2013 and prior radiographs dating back to 05/22/2011. 05/22/2011 CT  FINDINGS: Cardiomegaly noted.  Pulmonary vascular congestion is identified.  Patchy opacities/atelectasis within the left lung again noted.  Moderate to severe gaseous distention of the colon is noted. The sigmoid colon measures up to 13.6 cm. A sigmoid obstruction is not excluded although this bowel gas pattern has a somewhat similar appearance to 05/22/2011. .  There is no evidence of pneumoperitoneum.  Surgical clips within the right upper abdomen are noted.  A remote right clavicle fracture and left hip arthroplasty again identified.  IMPRESSION: Gaseous distention of the colon with the sigmoid colon measuring up to 13.6 cm. This could represent a sigmoid obstruction but has  a somewhat similar appearance to 05/22/2011. CT may be helpful for further evaluation.  Cardiomegaly with pulmonary vascular congestion and chronic left lung opacities.   Electronically Signed   By: Laveda Abbe M.D.   On: 05/23/2013 21:22    Medications / Allergies: per chart  Antibiotics: Anti-infectives   Start     Dose/Rate Route Frequency Ordered Stop   05/25/13 0300  cefTRIAXone (ROCEPHIN) 1  g in dextrose 5 % 50 mL IVPB     1 g 100 mL/hr over 30 Minutes Intravenous Every 24 hours 05/24/13 0346     05/24/13 1000  metroNIDAZOLE (FLAGYL) IVPB 500 mg     500 mg 100 mL/hr over 60 Minutes Intravenous Every 6 hours 05/24/13 0930     05/24/13 0300  cefTRIAXone (ROCEPHIN) 1 g in dextrose 5 % 50 mL IVPB  Status:  Discontinued     1 g 100 mL/hr over 30 Minutes Intravenous Every 24 hours 05/24/13 0249 05/24/13 0346   05/23/13 2315  cefTRIAXone (ROCEPHIN) 1 g in dextrose 5 % 50 mL IVPB     1 g 100 mL/hr over 30 Minutes Intravenous  Once 05/23/13 2300 05/24/13 4098       Note: This dictation was prepared with Dragon/digital dictation along with [employer]. Any transcriptional errors that result from this process are unintentional.

## 2013-05-25 NOTE — Consult Note (Signed)
WOC wound consult note Reason for Consult: Consult requested for sacrum.   Wound type: 2 areas of stage 2 wounds to sacrum area. Pressure Ulcer POA: Yes Measurement:  Both wounds are .2X.2X.1cm; pale yellow wound bed, mod amt yellow drainage, no odor. Dressing procedure/placement/frequency: Foam dressing to protect and absorb drainage. Please re-consult if further assistance is needed.  Thank-you,  Cammie Mcgee MSN, RN, CWOCN, [place], CNS [phone]

## 2013-05-25 NOTE — Progress Notes (Signed)
PROGRESS NOTE  Maria Christian:[id number] DOB: 11/10/1924 DOA: 05/23/2013 PCP: Karlene Einstein, MD  Assessment/Plan: Sigmoid Volvulus  - s/p emergent flexible sigmoidoscopy for decompression on 12/20 am. Patient initially with NG tube which was discontinued. Repeat abdominal film 05/25/13 with persistent sigmoid colonic volvulus, but she is asymptomatic. Reports diarrhea. Currently on liquid diet, tolerating that well.  UTI - Continue ceftriaxone until urine culture returns. Preliminary report with 10^5 E coli.  Hypokalemia - replete, Mg 1.7.  Diastolic CHF -On exam patient appears to be compensated  Pulmonary hypertension - Stable  HTN - discontinue IV metoprolol since mild bradycardia. Resume Amlodipine. Hold lisinopril still. Will likely not need Clonidine in the future and would avoid this medication in her.  Atrial fibrillation -Currently in normal sinus rhythm  - Aspirin and Plavix held secondary to sigmoid volvulus  Diabetes type 2 - moderate SSI  Sacral decubitus -Consulted wound care to evaluate and treat  Dementia - mild Depression - Hold home medication until patient cleared for PO  Diet: full liquid Fluids: NS DVT Prophylaxis: Lovenox  Code Status: DNR Family Communication: none  Disposition Plan: inpatient  Consultants:  Surgery  GI  Procedures:  Flex sig 12/20   Antibiotics  Anti-infectives   Start     Dose/Rate Route Frequency Ordered Stop   05/25/13 0300  cefTRIAXone (ROCEPHIN) 1 g in dextrose 5 % 50 mL IVPB  Status:  Discontinued     1 g 100 mL/hr over 30 Minutes Intravenous Every 24 hours 05/24/13 0346 05/25/13 0829   05/24/13 1000  metroNIDAZOLE (FLAGYL) IVPB 500 mg  Status:  Discontinued     500 mg 100 mL/hr over 60 Minutes Intravenous Every 6 hours 05/24/13 0930 05/25/13 0829   05/24/13 0300  cefTRIAXone (ROCEPHIN) 1 g in dextrose 5 % 50 mL IVPB  Status:  Discontinued     1 g 100 mL/hr over 30 Minutes Intravenous Every 24 hours 05/24/13  0249 05/24/13 0346   05/23/13 2315  cefTRIAXone (ROCEPHIN) 1 g in dextrose 5 % 50 mL IVPB     1 g 100 mL/hr over 30 Minutes Intravenous  Once 05/23/13 2300 05/24/13 0313     Antibiotics Given (last 72 hours)   Date/Time Action Medication Dose Rate   05/24/13 1020 Given   metroNIDAZOLE (FLAGYL) IVPB 500 mg 500 mg 100 mL/hr   05/24/13 1648 Given   metroNIDAZOLE (FLAGYL) IVPB 500 mg 500 mg 100 mL/hr   05/24/13 2137 Given   metroNIDAZOLE (FLAGYL) IVPB 500 mg 500 mg 100 mL/hr   05/25/13 8119 Given   cefTRIAXone (ROCEPHIN) 1 g in dextrose 5 % 50 mL IVPB 1 g 100 mL/hr   05/25/13 0446 Given   metroNIDAZOLE (FLAGYL) IVPB 500 mg 500 mg 100 mL/hr      HPI/Subjective: - complains of shoulder pain, some chronicity but feels worse today  Objective: Filed Vitals:   05/24/13 1411 05/24/13 2105 05/25/13 0356 05/25/13 0403  BP: 124/48 125/50 152/67   Pulse: 85 87 81   Temp: 98.2 F (36.8 C) 98.1 F (36.7 C) 99.7 F (37.6 C)   TempSrc: Oral Oral Oral   Resp: 16 18 20    Height:      Weight:    73.8 kg (162 lb 11.2 oz)  SpO2: 98% 98% 95%     Intake/Output Summary (Last 24 hours) at 05/25/13 0852 Last data filed at 05/25/13 0700  Gross per 24 hour  Intake   1915 ml  Output      0  ml  Net   1915 ml   Filed Weights   05/24/13 0412 05/25/13 0403  Weight: 72.802 kg (160 lb 8 oz) 73.8 kg (162 lb 11.2 oz)    Exam:   General:  NAD  Cardiovascular: regular rate and rhythm, without MRG  Respiratory: good air movement, clear to auscultation throughout, no wheezing, ronchi or rales  Abdomen: NTTP  MSK: no peripheral edema  Neuro: non focal  Data Reviewed: Basic Metabolic Panel:  Recent Labs Lab 05/23/13 1910 05/24/13 0250 05/24/13 0518 05/24/13 0920 05/25/13 0656  NA 146*  --  143 145 142  K 2.5* 2.3* 2.6* 2.8* 2.4*  CL 107  --  109 111 111  CO2 25  --  24 27 21   GLUCOSE 164*  --  143* 86 136*  BUN 9  --  8 9 8   CREATININE 0.79  --  0.81 0.85 0.69  CALCIUM 9.4  --   8.4 8.6 8.6  MG  --  1.8 1.8  --  1.7   Liver Function Tests:  Recent Labs Lab 05/23/13 1910 05/24/13 0518 05/25/13 0656  AST 18 14 14   ALT 8 7 6   ALKPHOS 63 53 55  BILITOT 0.3 0.2* 0.2*  PROT 7.0 5.9* 5.8*  ALBUMIN 3.8 3.1* 3.0*   No results found for this basename: LIPASE, AMYLASE,  in the last 168 hours No results found for this basename: AMMONIA,  in the last 168 hours CBC:  Recent Labs Lab 05/23/13 1910 05/24/13 0518 05/25/13 0656  WBC 6.5 6.2 5.5  NEUTROABS  --  4.3 3.7  HGB 10.1* 8.7* 8.6*  HCT 30.6* 26.2* 25.8*  MCV 99.7 100.0 100.0  PLT 132* 103* 104*   Cardiac Enzymes: No results found for this basename: CKTOTAL, CKMB, CKMBINDEX, TROPONINI,  in the last 168 hours BNP (last 3 results)  Recent Labs  05/24/13 0250  PROBNP 3915.0*   CBG:  Recent Labs Lab 05/24/13 1650 05/24/13 2012 05/24/13 2334 05/25/13 0340 05/25/13 0729  GLUCAP 105* 140* 82 96 120*    Recent Results (from the past 240 hour(s))  MRSA PCR SCREENING     Status: None   Collection Time    05/24/13  4:44 AM      Result Value Range Status   MRSA by PCR NEGATIVE  NEGATIVE Final   Comment:            The GeneXpert MRSA Assay (FDA     approved for NASAL specimens     only), is one component of a     comprehensive MRSA colonization     surveillance program. It is not     intended to diagnose MRSA     infection nor to guide or     monitor treatment for     MRSA infections.     Studies: Ct Abdomen Pelvis W Contrast  05/23/2013   CLINICAL DATA:  79 year old female with abdominal and pelvic pain. Nausea and vomiting.  EXAM: CT ABDOMEN AND PELVIS WITH CONTRAST  TECHNIQUE: Multidetector CT imaging of the abdomen and pelvis was performed using the standard protocol following bolus administration of intravenous contrast.  CONTRAST:  OMNIPAQUE IOHEXOL 300 MG/ML  SOLN  COMPARISON:  05/22/2011 CT and radiographs performed today.  FINDINGS: There is a volvulus of the mid-distal  sigmoid colon causing a high-grade obstruction. There is a small amount of fluid within the rectum present.  There is no evidence of pneumoperitoneum. A trace amount of free pelvic fluid is  noted.  Horseshoe kidney is again noted with nonobstructing left renal calculus.  Mild intrahepatic biliary fullness is identified.  The spleen and adrenal glands are unremarkable. A 1 x 2 cm hypodense lesion within the pancreatic body is unchanged. There is no evidence of distal pancreatic ductal dilatation.  There is no evidence of enlarged lymph nodes or abdominal aortic aneurysm.  No acute or suspicious bony abnormalities are identified. Left hip replacement is present.  Cardiomegaly and mild bibasilar atelectasis identified.  IMPRESSION: Mid-distal sigmoid colonic volvulus causing high-grade obstruction. No evidence of pneumoperitoneum.  Horseshoe kidney with nonobstructing left renal calculus.  Unchanged 1 x 2 cm pancreatic body lesion.  Cardiomegaly and mild bibasilar atelectasis.   Electronically Signed   By: Laveda Abbe M.D.   On: 05/23/2013 22:59   Dg Abd 2 Views  05/24/2013   CLINICAL DATA:  Abdominal pain, history of sigmoid volvulus.  EXAM: ABDOMEN - 2 VIEW, supine and left side down decubitus film  COMPARISON:  CT scan of the abdomen and pelvis dated May 23, 2013.  FINDINGS: There remains a loop of moderately distended gas-filled of bowel in the mid to lower abdomen and upper pelvis consistent with clinically suspected sigmoid volvulus. Proximal to this the colon does not appear abnormally distended. There is contrast within the urinary bladder.  The esophagogastric tube tip is at the level of the GE junction with the proximal port is above the GE junction in the distal thoracic esophagus. Advancement by at least 20 cm is recommended. There are surgical clips in the gallbladder fossa. There is a prosthetic left hip joint.  IMPRESSION: 1. The esophagogastric tube position is inadequate and advancement by 20 cm  is recommended to assure that the proximal port lies below the GE junction. 2. There remains mild gaseous distention of a curvilinear loop of bowel in the lower abdomen and upper pelvis consistent with the clinically suspected sigmoid volvulus. The degree of colonic distention with gas and stool appears somewhat less on today's abdominal series when compared to the earlier CT scan.   Electronically Signed   By: David  [place]   On: 05/24/2013 14:11   Dg Abd Acute W/chest  05/23/2013   CLINICAL DATA:  79 year old female with abdominal pain, distention and vomiting.  EXAM: ACUTE ABDOMEN SERIES (ABDOMEN 2 VIEW & CHEST 1 VIEW)  COMPARISON:  04/19/2013 and prior radiographs dating back to 05/22/2011. 05/22/2011 CT  FINDINGS: Cardiomegaly noted.  Pulmonary vascular congestion is identified.  Patchy opacities/atelectasis within the left lung again noted.  Moderate to severe gaseous distention of the colon is noted. The sigmoid colon measures up to 13.6 cm. A sigmoid obstruction is not excluded although this bowel gas pattern has a somewhat similar appearance to 05/22/2011. .  There is no evidence of pneumoperitoneum.  Surgical clips within the right upper abdomen are noted.  A remote right clavicle fracture and left hip arthroplasty again identified.  IMPRESSION: Gaseous distention of the colon with the sigmoid colon measuring up to 13.6 cm. This could represent a sigmoid obstruction but has a somewhat similar appearance to 05/22/2011. CT may be helpful for further evaluation.  Cardiomegaly with pulmonary vascular congestion and chronic left lung opacities.   Electronically Signed   By: Laveda Abbe M.D.   On: 05/23/2013 21:22    Scheduled Meds: . acetaminophen  650 mg Oral TID  . acidophilus  1 capsule Oral Daily  . aspirin EC  81 mg Oral Daily  . diclofenac sodium  2 g Topical  QID  . donepezil  5 mg Oral QHS  . dorzolamide  1 drop Both Eyes TID  . enoxaparin (LOVENOX) injection  40 mg Subcutaneous Q24H  .  gabapentin  300 mg Oral BID  . insulin aspart  0-15 Units Subcutaneous Q4H  . ketotifen  1 drop Both Eyes BID  . latanoprost  1 drop Both Eyes QHS  . lip balm  1 application Topical BID  . loratadine  10 mg Oral Daily  . magnesium sulfate 1 - 4 g bolus IVPB  2 g Intravenous Once  . pantoprazole  40 mg Oral Daily  . potassium chloride  40 mEq Oral Q3H  . [START ON 05/27/2013] potassium chloride SA  20 mEq Oral Daily  . sertraline  50 mg Oral QHS  . vitamin C  500 mg Oral Daily   Continuous Infusions: . sodium chloride 50 mL/hr at 05/24/13 2138    Active Problems:   Atrial fibrillation with controlled ventricular response   Diastolic CHF, chronic   Parkinson disease   Hypertension   Hyperlipemia   Diabetes mellitus   Coronary artery disease   Thrombocytopenia   TIA (transient ischemic attack)   Dementia   Depression   Sigmoid volvulus   Pulmonary hypertension   Sacral decubitus ulcer   HOH (hard of hearing)   Pain in right shoulder joint  Time spent: 25  Pamella Pert, MD [facility] Pager [phone]. If 7 PM - 7 AM, please contact night-coverage at [email or web address], password [facility] 05/25/2013, 8:52 AM  LOS: 2 days

## 2013-05-26 ENCOUNTER — Inpatient Hospital Stay (HOSPITAL_COMMUNITY): Payer: PRIVATE HEALTH INSURANCE

## 2013-05-26 ENCOUNTER — Encounter (HOSPITAL_COMMUNITY): Payer: Self-pay | Admitting: Internal Medicine

## 2013-05-26 ENCOUNTER — Encounter (HOSPITAL_COMMUNITY)
Admission: EM | Disposition: A | Discharge status: Skilled Nursing Facility | Payer: Self-pay | Source: Home / Self Care | Attending: Internal Medicine

## 2013-05-26 DIAGNOSIS — K562 Volvulus: Secondary | ICD-10-CM | POA: Diagnosis not present

## 2013-05-26 HISTORY — PX: FLEXIBLE SIGMOIDOSCOPY: SHX5431

## 2013-05-26 LAB — MAGNESIUM: Magnesium: 2.3 mg/dL (ref 1.5–2.5)

## 2013-05-26 LAB — CBC WITH DIFFERENTIAL/PLATELET
Basophils Absolute: 0 10*3/uL (ref 0.0–0.1)
Basophils Relative: 0 % (ref 0–1)
Eosinophils Absolute: 0.3 10*3/uL (ref 0.0–0.7)
Eosinophils Relative: 4 % (ref 0–5)
HCT: 28.3 % — ABNORMAL LOW (ref 36.0–46.0)
MCH: 33.8 pg (ref 26.0–34.0)
MCHC: 33.6 g/dL (ref 30.0–36.0)
MCV: 100.7 fL — ABNORMAL HIGH (ref 78.0–100.0)
Monocytes Absolute: 0.9 10*3/uL (ref 0.1–1.0)
Monocytes Relative: 13 % — ABNORMAL HIGH (ref 3–12)
Neutro Abs: 4.5 10*3/uL (ref 1.7–7.7)
RDW: 14.1 % (ref 11.5–15.5)

## 2013-05-26 LAB — COMPREHENSIVE METABOLIC PANEL
Albumin: 3.2 g/dL — ABNORMAL LOW (ref 3.5–5.2)
Alkaline Phosphatase: 62 U/L (ref 39–117)
BUN: 10 mg/dL (ref 6–23)
Calcium: 8.6 mg/dL (ref 8.4–10.5)
Chloride: 106 mEq/L (ref 96–112)
Potassium: 2.8 mEq/L — ABNORMAL LOW (ref 3.5–5.1)
Sodium: 140 mEq/L (ref 135–145)
Total Bilirubin: 0.4 mg/dL (ref 0.3–1.2)

## 2013-05-26 LAB — GLUCOSE, CAPILLARY
Glucose-Capillary: 134 mg/dL — ABNORMAL HIGH (ref 70–99)
Glucose-Capillary: 178 mg/dL — ABNORMAL HIGH (ref 70–99)

## 2013-05-26 SURGERY — SIGMOIDOSCOPY, FLEXIBLE
Anesthesia: Moderate Sedation

## 2013-05-26 MED ORDER — POTASSIUM CHLORIDE 20 MEQ/15ML (10%) PO LIQD
40.0000 meq | ORAL | Status: AC
  Administered 2013-05-26: 17:00:00 40 meq via ORAL
  Filled 2013-05-26 (×2): qty 30

## 2013-05-26 MED ORDER — FENTANYL CITRATE 0.05 MG/ML IJ SOLN
INTRAMUSCULAR | Status: DC | PRN
  Administered 2013-05-26: 12.5 ug via INTRAVENOUS

## 2013-05-26 MED ORDER — POTASSIUM CHLORIDE 20 MEQ/15ML (10%) PO LIQD
40.0000 meq | ORAL | Status: DC
  Filled 2013-05-26 (×2): qty 30

## 2013-05-26 MED ORDER — FENTANYL CITRATE 0.05 MG/ML IJ SOLN
INTRAMUSCULAR | Status: AC
  Filled 2013-05-26: qty 2

## 2013-05-26 MED ORDER — MIDAZOLAM HCL 10 MG/2ML IJ SOLN
INTRAMUSCULAR | Status: AC
  Filled 2013-05-26: qty 2

## 2013-05-26 MED ORDER — MIDAZOLAM HCL 10 MG/2ML IJ SOLN
INTRAMUSCULAR | Status: DC | PRN
  Administered 2013-05-26: 1 mg via INTRAVENOUS

## 2013-05-26 NOTE — H&P (View-Only) (Signed)
Patient ID: Maria Christian, female   DOB: July 22, 1924, 79 y.o.   MRN: [id number] [facility] Progress Note  Subjective: IN good spirits and very appreciative  of care. Hungry, denies any abdominal pain- no Bm;s this morning, had diarrhea last night KUB- 12/20 - mild gaseous distention and persistent volvulus pattern  Objective:  Vital signs in last 24 hours: Temp:  [98.1 F (36.7 C)-99.7 F (37.6 C)] 99.7 F (37.6 C) (12/21 0356) Pulse Rate:  [81-87] 81 (12/21 0356) Resp:  [16-20] 20 (12/21 0356) BP: (124-152)/(48-67) 152/67 mmHg (12/21 0356) SpO2:  [95 %-98 %] 95 % (12/21 0356) Weight:  [162 lb 11.2 oz (73.8 kg)] 162 lb 11.2 oz (73.8 kg) (12/21 0403) Last BM Date: 05/24/13 General:   Alert,  Well-developed, Elderly HOH   in NAD Heart:  Regular rate and rhythm; no murmurs Pulm;clear Abdomen:  Soft, nontender , some increased distention this am over yesterday am- BS somewhat high pitched and tinkling Extremities:  Without edema. Neurologic:  Alert and  oriented x4;  grossly normal neurologically. Psych:  Alert and cooperative. Normal mood and affect.  Intake/Output from previous day: 12/20 0701 - 12/21 0700 In: 1915 [P.O.:480; I.V.:1435] Out: 0  Intake/Output this shift:    Lab Results:  Recent Labs  05/23/13 1910 05/24/13 0518 05/25/13 0656  WBC 6.5 6.2 5.5  HGB 10.1* 8.7* 8.6*  HCT 30.6* 26.2* 25.8*  PLT 132* 103* 104*   BMET  Recent Labs  05/24/13 0518 05/24/13 0920 05/25/13 0656  NA 143 145 142  K 2.6* 2.8* 2.4*  CL 109 111 111  CO2 24 27 21   GLUCOSE 143* 86 136*  BUN 8 9 8   CREATININE 0.81 0.85 0.69  CALCIUM 8.4 8.6 8.6   LFT  Recent Labs  05/23/13 1910  05/25/13 0656  PROT 7.0  < > 5.8*  ALBUMIN 3.8  < > 3.0*  AST 18  < > 14  ALT 8  < > 6  ALKPHOS 63  < > 55  BILITOT 0.3  < > 0.2*  BILIDIR <0.1  --   --   IBILI NOT CALCULATED  --   --   < > = values in this interval not displayed.    Assessment / Plan: #1  79 yo female  s/p decompression of sigmoid volvulus 12/20-  Suspect  Volvulus has recurred- not symptomatic at present #2  Severe hypokalemia- being replaced, as is MG #3 parkinsons #5 CAD  Continue liquid diet Repeat abdominal films today Can reattempt colonoscopic decompression if needed by if recurring may need surgery Active Problems:   Atrial fibrillation with controlled ventricular response   Diastolic CHF, chronic   Parkinson disease   Hypertension   Hyperlipemia   Diabetes mellitus   Coronary artery disease   Thrombocytopenia   TIA (transient ischemic attack)   Dementia   Depression   Sigmoid volvulus   Pulmonary hypertension   Sacral decubitus ulcer   HOH (hard of hearing)   Pain in right shoulder joint     LOS: 2 days   [name]  05/25/2013, 9:30 AM

## 2013-05-26 NOTE — Interval H&P Note (Signed)
History and Physical Interval Note:  05/26/2013 12:44 PM  Maria Christian  has presented today for surgery, with the diagnosis of sigmoid volvulus  The various methods of treatment have been discussed with the patient and family. After consideration of risks, benefits and other options for treatment, the patient has consented to  Procedure(s):  flex with decompression of sigmoid volvulus (N/A) as a surgical intervention .  The patient's history has been reviewed, patient examined, no change in status, stable for surgery.  I have reviewed the patient's chart and labs.  Questions were answered to the patient's satisfaction.    The recent H&P (dated **05/25/13*) was reviewed, the patient was examined and there is no change in the patients condition since that H&P was completed.   Maria Christian  05/26/2013, 12:44 PM    Maria Christian

## 2013-05-26 NOTE — Progress Notes (Addendum)
Patient ID: Maria Christian, female   DOB: 1924-12-16, 79 y.o.   MRN: [id number] 2 Days Post-Op  Subjective: Patient is so hard of hearing that she is having a difficult time carrying a conversation with me.  She denies abdominal pain currently.  Had a BM.  Objective: Vital signs in last 24 hours: Temp:  [98.1 F (36.7 C)-99.5 F (37.5 C)] 98.6 F (37 C) (12/22 0449) Pulse Rate:  [81-85] 85 (12/22 0449) Resp:  [16-18] 18 (12/22 0449) BP: (132-137)/(62-69) 137/69 mmHg (12/22 0449) SpO2:  [96 %-99 %] 99 % (12/22 0449) Weight:  [166 lb 14.4 oz (75.705 kg)] 166 lb 14.4 oz (75.705 kg) (12/22 0449) Last BM Date: 05/26/13  Intake/Output from previous day: 12/21 0701 - 12/22 0700 In: 1490 [P.O.:240; I.V.:1200; IV Piggyback:50] Out: -  Intake/Output this shift:    PE: Abd: soft, minimal distention, NT, +BS  Lab Results:   Recent Labs  05/25/13 0656 05/26/13 0526  WBC 5.5 6.8  HGB 8.6* 9.5*  HCT 25.8* 28.3*  PLT 104* 121*   BMET  Recent Labs  05/25/13 0656 05/25/13 1615 05/26/13 0526  NA 142  --  140  K 2.4* 3.2* 2.8*  CL 111  --  106  CO2 21  --  22  GLUCOSE 136*  --  110*  BUN 8  --  10  CREATININE 0.69  --  0.71  CALCIUM 8.6  --  8.6   PT/INR No results found for this basename: LABPROT, INR,  in the last 72 hours CMP     Component Value Date/Time   NA 140 05/26/2013 0526   K 2.8* 05/26/2013 0526   CL 106 05/26/2013 0526   CO2 22 05/26/2013 0526   GLUCOSE 110* 05/26/2013 0526   BUN 10 05/26/2013 0526   CREATININE 0.71 05/26/2013 0526   CALCIUM 8.6 05/26/2013 0526   PROT 6.3 05/26/2013 0526   ALBUMIN 3.2* 05/26/2013 0526   AST 15 05/26/2013 0526   ALT 7 05/26/2013 0526   ALKPHOS 62 05/26/2013 0526   BILITOT 0.4 05/26/2013 0526   GFRNONAA 76* 05/26/2013 0526   GFRAA 88* 05/26/2013 0526   Lipase     Component Value Date/Time   LIPASE 22 04/19/2013 1245       Studies/Results: Dg Shoulder Right  05/25/2013   CLINICAL DATA:  Shoulder pain.   EXAM: RIGHT SHOULDER - 2+ VIEW  COMPARISON:  07/16/2011.  FINDINGS: Old healed slightly angulated right distal clavicular fracture is again noted. Prominent acromioclavicular degenerative change again noted. Glenohumeral degenerative change present. No acute abnormality identified. No evidence of acute fracture, dislocation, or separation.  IMPRESSION: 1. Old healed right clavicular fracture. 2. Prominent degenerative changes about the right shoulder.   Electronically Signed   By: Maisie Fus  Register   On: 05/25/2013 14:30   Dg Abd 2 Views  05/25/2013   CLINICAL DATA:  Sigmoid volvulus. Decompressed 05/24/2013, question recurrence.  EXAM: ABDOMEN - 2 VIEW  COMPARISON:  CT 05/23/2013 .  FINDINGS: Sigmoid colon is distended, there appears to be a sigmoid colonic volvulus. There is mild proximal bowel distention. Surgical clips in the right upper quadrant. No free air is identified. The left hip replacement . These findings were discussed with nurse Marylene Land at 1:31 p.m. on 05/25/2013. The nurse was instructed to inform the patient's physician of these findings.  IMPRESSION: Findings consistent with sigmoid colonic volvulus.   Electronically Signed   By: Maisie Fus  Register   On: 05/25/2013 13:32   Dg Abd  2 Views  05/24/2013   CLINICAL DATA:  Abdominal pain, history of sigmoid volvulus.  EXAM: ABDOMEN - 2 VIEW, supine and left side down decubitus film  COMPARISON:  CT scan of the abdomen and pelvis dated May 23, 2013.  FINDINGS: There remains a loop of moderately distended gas-filled of bowel in the mid to lower abdomen and upper pelvis consistent with clinically suspected sigmoid volvulus. Proximal to this the colon does not appear abnormally distended. There is contrast within the urinary bladder.  The esophagogastric tube tip is at the level of the GE junction with the proximal port is above the GE junction in the distal thoracic esophagus. Advancement by at least 20 cm is recommended. There are surgical clips  in the gallbladder fossa. There is a prosthetic left hip joint.  IMPRESSION: 1. The esophagogastric tube position is inadequate and advancement by 20 cm is recommended to assure that the proximal port lies below the GE junction. 2. There remains mild gaseous distention of a curvilinear loop of bowel in the lower abdomen and upper pelvis consistent with the clinically suspected sigmoid volvulus. The degree of colonic distention with gas and stool appears somewhat less on today's abdominal series when compared to the earlier CT scan.   Electronically Signed   By: Jennica Tagliaferri  [place]   On: 05/24/2013 14:11    Anti-infectives: Anti-infectives   Start     Dose/Rate Route Frequency Ordered Stop   05/26/13 0400  cefTRIAXone (ROCEPHIN) 1 g in dextrose 5 % 50 mL IVPB     1 g 100 mL/hr over 30 Minutes Intravenous Every 24 hours 05/25/13 1518     05/25/13 0300  cefTRIAXone (ROCEPHIN) 1 g in dextrose 5 % 50 mL IVPB  Status:  Discontinued     1 g 100 mL/hr over 30 Minutes Intravenous Every 24 hours 05/24/13 0346 05/25/13 0829   05/24/13 1000  metroNIDAZOLE (FLAGYL) IVPB 500 mg  Status:  Discontinued     500 mg 100 mL/hr over 60 Minutes Intravenous Every 6 hours 05/24/13 0930 05/25/13 0829   05/24/13 0300  cefTRIAXone (ROCEPHIN) 1 g in dextrose 5 % 50 mL IVPB  Status:  Discontinued     1 g 100 mL/hr over 30 Minutes Intravenous Every 24 hours 05/24/13 0249 05/24/13 0346   05/23/13 2315  cefTRIAXone (ROCEPHIN) 1 g in dextrose 5 % 50 mL IVPB     1 g 100 mL/hr over 30 Minutes Intravenous  Once 05/23/13 2300 05/24/13 0313       Assessment/Plan  1. Sigmoid volvulus, s/p decompression on 05-24-13 by Dr. Erick Blinks  Patient Active Problem List   Diagnosis Date Noted  . Pain in right shoulder joint 05/25/2013  . Sigmoid volvulus 05/24/2013  . Pulmonary hypertension 05/24/2013  . Sacral decubitus ulcer 05/24/2013  . HOH (hard of hearing) 05/24/2013  . Atrial fibrillation 01/30/2013  . TIA (transient ischemic  attack) 12/19/2012  . Dementia 12/19/2012  . Insomnia 12/19/2012  . Depression 12/19/2012  . Constipation 12/19/2012  . Type II or unspecified type diabetes mellitus without mention of complication, not stated as uncontrolled 10/26/2012  . Hypokalemia 07/17/2011  . Shoulder pain 07/17/2011  . Pancreatic mass 07/17/2011  . Dysphagia 07/17/2011  . Thrombocytopenia 07/16/2011  . UTI (lower urinary tract infection) 07/13/2011  . Pneumonia 07/13/2011  . History of adenomatous polyp of colon 07/02/2011  . Preventative health care 07/01/2011  . Parkinson disease   . Hypertension   . GERD (gastroesophageal reflux disease)   . Hyperlipemia   .  Diabetes mellitus   . Coronary artery disease   . Atrial fibrillation with controlled ventricular response 05/24/2011  . Diastolic CHF, chronic 05/24/2011  . Palpitations 05/24/2011  . Pyuria 05/24/2011   Plan: 1. The patient states she had a BM.  She seems asymptomatic today; however, her films yesterday questioned recurrent volvulus.  Her HOH makes it extremely difficult to talk to her about what she would like out of her care.  I tried to discuss surgery with her and if she would want that if she continued to volvulize, but she can't hear well enough to carry this conversation and understand it. 2. Repeat films today to see what her colon looks like.   LOS: 3 days   OSBORNE,KELLY E 05/26/2013, 9:29 AM Pager: [phone]  Agree with above. She is so hard of hearing, that it is impossible to have a discussion with her.  But she can read my ID badge. Sigmoid volvulus decompressed a second time today by Dr. Arlyce Dice.  He left a decompression tube.  Ovidio Kin, MD, [facility] Pager: [phone] Office phone:  [phone]

## 2013-05-26 NOTE — Progress Notes (Signed)
Sigmoid volvulus was again reduced with the colonoscope.  A colonic decompression tube was placed in the more proximal colon with the hope that this may prevent a recurrent volvulus.

## 2013-05-26 NOTE — H&P (View-Only) (Signed)
Patient seen, examined, and I agree with the above documentation, including the assessment and plan. Imaging suggests slowly recurring volvulus pattern, though her abd is at this point, much softer than on initial presentation Will plan repeat flex sig for decompression tomorrow, which I expect will only be temporizing Surgery, though high risk given age and comorbidity, may ultimately be the only definitive, more lasting, therapy

## 2013-05-26 NOTE — Op Note (Signed)
[facility] [address] [place], [address]   FLEX SIGMOIDOSCOPY PROCEDURE REPORT  PATIENT: Maria Christian, Maria Christian  MR#: [id number] BIRTHDATE: 1924/11/08 , 88  yrs. old GENDER: Female ENDOSCOPIST: Louis Meckel, MD REFERRED BY: PROCEDURE DATE:  05/26/2013 PROCEDURE:   Sigmoidoscopy with decompression of sigmoid volvulus, insertion of colonic decompression tube INDICATIONS :recurrent sigmoid volvulus MEDICATIONS: Fentanyl 12.5 mcg IV and Versed 1mg  IV  DESCRIPTION OF PROCEDURE:    Physical exam was performed.  Informed consent was obtained from the patient after explaining the benefits, risks, and alternatives to procedure.  The patient was connected to monitor and placed in left lateral position. Continuous oxygen was provided by nasal cannula and IV medicine administered through an indwelling cannula.  After administration of sedation and rectal exam, the patients rectum was intubated and the     colonoscope was advanced under direct visualization to the cecum.  The scope was removed slowly by carefully examining the color, texture, anatomy, and integrity mucosa on the way out.  The patient was recovered in endoscopy and discharged home in satisfactory condition.       COLON FINDINGS: At approximate 15 cm from the anus there was a stenotic area that was easily traversed by the colonoscope. Proximal to this area the colon was diffusely dilated.  Findings are compatible with a sigmoid volvulus.  Air and fluid was suctioned from the more proximal colon.  A #10 [place] colon decompression tube was inserted into the proximal sigmoid/distal descending colon.  The mucosa was normal.  PREP QUALITY: CECAL W/D TIME:  COMPLICATIONS: None  ENDOSCOPIC IMPRESSION: sigmoid volvulus-status post reduction of volvulus and insertion of colon decompression tube  RECOMMENDATIONS: clinical followup      _______________________________ eSigned:  Louis Meckel, MD  05/26/2013 1:24 PM   cc: Karlene Einstein, MD

## 2013-05-26 NOTE — Care Management Note (Addendum)
    Page 1 of 1   06/02/2013     2:45:24 PM   CARE MANAGEMENT NOTE 06/02/2013  Patient:  Maria Christian, Maria Christian   Account Number:  [id number]  Date Initiated:  05/25/2013  Documentation initiated by:  Lanier Clam  Subjective/Objective Assessment:   78 Y/O F ADMITTED W/ABD PAIN,NAUSEA.     Action/Plan:   FROM SNF-[facility]   Anticipated DC Date:  06/02/2013   Anticipated DC Plan:  SKILLED NURSING FACILITY      DC Planning Services  CM consult      Choice offered to / List presented to:             Status of service:  In process, will continue to follow Medicare Important Message given?   (If response is "NO", the following Medicare IM given date fields will be blank) Date Medicare IM given:   Date Additional Medicare IM given:    Discharge Disposition:    Per UR Regulation:  Reviewed for med. necessity/level of care/duration of stay  If discussed at Long Length of Stay Meetings, dates discussed:   05/27/2013    Comments:  06/02/13 Alessander Sikorski RN,BSN NCM [phone] GI SIGNED OFF.D/C PLAN SNF.  05/30/13 Toby Ayad RN,BSN NCM [phone] IV ABX.D/C PLAN RETURN SNF.  05/26/13 Kynlea Blackston RN,BSN NCM [phone] GI FOLLOWING-FLEX SIGMOID DECOMPRESSION.

## 2013-05-26 NOTE — Progress Notes (Signed)
PROGRESS NOTE  Maria Christian WUJ:[id number] DOB: [birth year] DOA: 05/23/2013 PCP: Karlene Einstein, MD  Assessment/Plan: Sigmoid Volvulus  - s/p emergent flexible sigmoidoscopy for decompression on 12/20 am. Patient initially with NG tube which was discontinued. Repeat abdominal film 05/25/13 with persistent sigmoid colonic volvulus, but she is asymptomatic. Reports diarrhea. Currently on liquid diet, tolerating that well.  - re-reduced again today with colonic decompression tube placement.  UTI - Continue ceftriaxone until urine culture returns. Preliminary report with 10^5 E coli.  Hypokalemia - replete, Mg 1.7.  Diastolic CHF -On exam patient appears to be compensated  Pulmonary hypertension - Stable  HTN - discontinue IV metoprolol since mild bradycardia. Resume Amlodipine. Hold lisinopril still. Will likely not need Clonidine in the future and would avoid this medication in her.  Atrial fibrillation -Currently in normal sinus rhythm  - Aspirin and Plavix held secondary to sigmoid volvulus  Diabetes type 2 - moderate SSI  Sacral decubitus -Consulted wound care to evaluate and treat  Dementia - mild Depression - Hold home medication until patient cleared for PO  Diet: full liquid Fluids: NS DVT Prophylaxis: Lovenox  Code Status: DNR Family Communication: none  Disposition Plan: inpatient  Consultants:  Surgery  GI  Procedures:  Flex sig 12/20  Flex 12/22   Antibiotics  Anti-infectives   Start     Dose/Rate Route Frequency Ordered Stop   05/26/13 0400  cefTRIAXone (ROCEPHIN) 1 g in dextrose 5 % 50 mL IVPB     1 g 100 mL/hr over 30 Minutes Intravenous Every 24 hours 05/25/13 1518     05/25/13 0300  cefTRIAXone (ROCEPHIN) 1 g in dextrose 5 % 50 mL IVPB  Status:  Discontinued     1 g 100 mL/hr over 30 Minutes Intravenous Every 24 hours 05/24/13 0346 05/25/13 0829   05/24/13 1000  metroNIDAZOLE (FLAGYL) IVPB 500 mg  Status:  Discontinued     500 mg 100 mL/hr  over 60 Minutes Intravenous Every 6 hours 05/24/13 0930 05/25/13 0829   05/24/13 0300  cefTRIAXone (ROCEPHIN) 1 g in dextrose 5 % 50 mL IVPB  Status:  Discontinued     1 g 100 mL/hr over 30 Minutes Intravenous Every 24 hours 05/24/13 0249 05/24/13 0346   05/23/13 2315  cefTRIAXone (ROCEPHIN) 1 g in dextrose 5 % 50 mL IVPB     1 g 100 mL/hr over 30 Minutes Intravenous  Once 05/23/13 2300 05/24/13 0313     Antibiotics Given (last 72 hours)   Date/Time Action Medication Dose Rate   05/24/13 1020 Given   metroNIDAZOLE (FLAGYL) IVPB 500 mg 500 mg 100 mL/hr   05/24/13 1648 Given   metroNIDAZOLE (FLAGYL) IVPB 500 mg 500 mg 100 mL/hr   05/24/13 2137 Given   metroNIDAZOLE (FLAGYL) IVPB 500 mg 500 mg 100 mL/hr   05/25/13 9562 Given   cefTRIAXone (ROCEPHIN) 1 g in dextrose 5 % 50 mL IVPB 1 g 100 mL/hr   05/25/13 0446 Given   metroNIDAZOLE (FLAGYL) IVPB 500 mg 500 mg 100 mL/hr   05/26/13 0459 Given   cefTRIAXone (ROCEPHIN) 1 g in dextrose 5 % 50 mL IVPB 1 g 100 mL/hr     HPI/Subjective: - feels good, denies chest pain, breathing difficulties.   Objective: Filed Vitals:   05/26/13 1400 05/26/13 1405 05/26/13 1410 05/26/13 1439  BP: 174/75 174/75 166/88 177/78  Pulse:    95  Temp:    98.3 F (36.8 C)  TempSrc:    Oral  Resp: 22 22  15 20  Height:      Weight:      SpO2:    100%    Intake/Output Summary (Last 24 hours) at 05/26/13 1620 Last data filed at 05/26/13 1445  Gross per 24 hour  Intake   1730 ml  Output      0 ml  Net   1730 ml   Filed Weights   05/24/13 0412 05/25/13 0403 05/26/13 0449  Weight: 72.802 kg (160 lb 8 oz) 73.8 kg (162 lb 11.2 oz) 75.705 kg (166 lb 14.4 oz)    Exam:  General:  NAD  Cardiovascular: regular rate and rhythm, without MRG  Respiratory: good air movement, clear to auscultation throughout, no wheezing, ronchi or rales  Abdomen: NTTP  MSK: no peripheral edema  Neuro: non focal  Data Reviewed: Basic Metabolic Panel:  Recent  Labs Lab 05/23/13 1910 05/24/13 0250 05/24/13 0518 05/24/13 0920 05/25/13 0656 05/25/13 1615 05/26/13 0526  NA 146*  --  143 145 142  --  140  K 2.5* 2.3* 2.6* 2.8* 2.4* 3.2* 2.8*  CL 107  --  109 111 111  --  106  CO2 25  --  24 27 21   --  22  GLUCOSE 164*  --  143* 86 136*  --  110*  BUN 9  --  8 9 8   --  10  CREATININE 0.79  --  0.81 0.85 0.69  --  0.71  CALCIUM 9.4  --  8.4 8.6 8.6  --  8.6  MG  --  1.8 1.8  --  1.7  --  2.3   Liver Function Tests:  Recent Labs Lab 05/23/13 1910 05/24/13 0518 05/25/13 0656 05/26/13 0526  AST 18 14 14 15   ALT 8 7 6 7   ALKPHOS 63 53 55 62  BILITOT 0.3 0.2* 0.2* 0.4  PROT 7.0 5.9* 5.8* 6.3  ALBUMIN 3.8 3.1* 3.0* 3.2*   No results found for this basename: LIPASE, AMYLASE,  in the last 168 hours No results found for this basename: AMMONIA,  in the last 168 hours CBC:  Recent Labs Lab 05/23/13 1910 05/24/13 0518 05/25/13 0656 05/26/13 0526  WBC 6.5 6.2 5.5 6.8  NEUTROABS  --  4.3 3.7 4.5  HGB 10.1* 8.7* 8.6* 9.5*  HCT 30.6* 26.2* 25.8* 28.3*  MCV 99.7 100.0 100.0 100.7*  PLT 132* 103* 104* 121*   Cardiac Enzymes: No results found for this basename: CKTOTAL, CKMB, CKMBINDEX, TROPONINI,  in the last 168 hours BNP (last 3 results)  Recent Labs  05/24/13 0250  PROBNP 3915.0*   CBG:  Recent Labs Lab 05/25/13 1955 05/26/13 0005 05/26/13 0447 05/26/13 0730 05/26/13 1404  GLUCAP 123* 207* 94 113* 134*    Recent Results (from the past 240 hour(s))  URINE CULTURE     Status: None   Collection Time    05/23/13  9:20 PM      Result Value Range Status   Specimen Description URINE, CATHETERIZED   Final   Special Requests NONE   Final   Culture  Setup Time     Final   Value: 05/24/2013 02:53     Performed at [facility] Count     Final   Value: >=100,000 COLONIES/ML     Performed at [facility]   Culture     Final   Value: ESCHERICHIA COLI     Performed at [facility]   Report  Status PENDING  Incomplete  MRSA PCR SCREENING     Status: None   Collection Time    05/24/13  4:44 AM      Result Value Range Status   MRSA by PCR NEGATIVE  NEGATIVE Final   Comment:            The GeneXpert MRSA Assay (FDA     approved for NASAL specimens     only), is one component of a     comprehensive MRSA colonization     surveillance program. It is not     intended to diagnose MRSA     infection nor to guide or     monitor treatment for     MRSA infections.     Studies: Dg Shoulder Right  05/25/2013   CLINICAL DATA:  Shoulder pain.  EXAM: RIGHT SHOULDER - 2+ VIEW  COMPARISON:  07/16/2011.  FINDINGS: Old healed slightly angulated right distal clavicular fracture is again noted. Prominent acromioclavicular degenerative change again noted. Glenohumeral degenerative change present. No acute abnormality identified. No evidence of acute fracture, dislocation, or separation.  IMPRESSION: 1. Old healed right clavicular fracture. 2. Prominent degenerative changes about the right shoulder.   Electronically Signed   By: Maisie Fus  Register   On: 05/25/2013 14:30   Dg Abd 2 Views  05/26/2013   CLINICAL DATA:  Possible sigmoid volvulus.  EXAM: ABDOMEN - 2 VIEW  COMPARISON:  May 25, 2013.  FINDINGS: There is a persistently distended gas-filled bowel loop in the mid and lower abdomen and upper pelvis similar to that seen previously. There is a moderate amount of gas in the ascending and transverse and descending portions of the colon. No gas is demonstrated within the rectum. No significant small bowel gas is demonstrated. No free extraluminal gas collections are demonstrated. The bony structures exhibit no acute abnormalities.  IMPRESSION: The findings are consistent with persistent sigmoid volvulus. There are no findings to suggest perforation currently.   Electronically Signed   By: David  [place]   On: 05/26/2013 12:59   Dg Abd 2 Views  05/25/2013   CLINICAL DATA:  Sigmoid volvulus.  Decompressed 05/24/2013, question recurrence.  EXAM: ABDOMEN - 2 VIEW  COMPARISON:  CT 05/23/2013 .  FINDINGS: Sigmoid colon is distended, there appears to be a sigmoid colonic volvulus. There is mild proximal bowel distention. Surgical clips in the right upper quadrant. No free air is identified. The left hip replacement . These findings were discussed with nurse Marylene Land at 1:31 p.m. on 05/25/2013. The nurse was instructed to inform the patient's physician of these findings.  IMPRESSION: Findings consistent with sigmoid colonic volvulus.   Electronically Signed   By: Maisie Fus  Register   On: 05/25/2013 13:32    Scheduled Meds: . acetaminophen  650 mg Oral TID  . acidophilus  1 capsule Oral Daily  . amLODipine  5 mg Oral Daily  . aspirin EC  81 mg Oral Daily  . cefTRIAXone (ROCEPHIN)  IV  1 g Intravenous Q24H  . diclofenac sodium  2 g Topical QID  . donepezil  5 mg Oral QHS  . dorzolamide  1 drop Both Eyes TID  . feeding supplement (RESOURCE BREEZE)  1 Container Oral TID BM  . gabapentin  300 mg Oral BID  . insulin aspart  0-15 Units Subcutaneous Q4H  . ketotifen  1 drop Both Eyes BID  . latanoprost  1 drop Both Eyes QHS  . lip balm  1 application Topical BID  . loratadine  10 mg  Oral Daily  . pantoprazole  40 mg Oral Daily  . potassium chloride  40 mEq Oral Q2H  . [START ON 05/27/2013] potassium chloride SA  20 mEq Oral Daily  . sertraline  50 mg Oral QHS  . vitamin C  500 mg Oral Daily   Continuous Infusions: . sodium chloride 50 mL/hr at 05/26/13 1541    Active Problems:   Atrial fibrillation with controlled ventricular response   Diastolic CHF, chronic   Parkinson disease   Hypertension   Hyperlipemia   Diabetes mellitus   Coronary artery disease   Thrombocytopenia   TIA (transient ischemic attack)   Dementia   Depression   Sigmoid volvulus   Pulmonary hypertension   Sacral decubitus ulcer   HOH (hard of hearing)   Pain in right shoulder joint   Volvulus of sigmoid  colon  Time spent: 25  Pamella Pert, MD [facility] Pager [phone]. If 7 PM - 7 AM, please contact night-coverage at [email or web address], password [email or web address] 05/26/2013, 4:20 PM  LOS: 3 days

## 2013-05-27 ENCOUNTER — Inpatient Hospital Stay (HOSPITAL_COMMUNITY): Payer: PRIVATE HEALTH INSURANCE

## 2013-05-27 ENCOUNTER — Encounter (HOSPITAL_COMMUNITY): Payer: Self-pay | Admitting: Gastroenterology

## 2013-05-27 DIAGNOSIS — K562 Volvulus: Secondary | ICD-10-CM | POA: Diagnosis not present

## 2013-05-27 DIAGNOSIS — R109 Unspecified abdominal pain: Secondary | ICD-10-CM | POA: Diagnosis not present

## 2013-05-27 LAB — COMPREHENSIVE METABOLIC PANEL
AST: 12 U/L (ref 0–37)
BUN: 7 mg/dL (ref 6–23)
CO2: 21 mEq/L (ref 19–32)
Calcium: 8.4 mg/dL (ref 8.4–10.5)
Chloride: 110 mEq/L (ref 96–112)
Creatinine, Ser: 0.63 mg/dL (ref 0.50–1.10)
GFR calc Af Amer: >90 mL/min (ref >90)
GFR calc non Af Amer: 79 mL/min — ABNORMAL LOW (ref >90)
Glucose, Bld: 103 mg/dL — ABNORMAL HIGH (ref 70–99)
Total Bilirubin: 0.4 mg/dL (ref 0.3–1.2)

## 2013-05-27 LAB — GLUCOSE, CAPILLARY
Glucose-Capillary: 143 mg/dL — ABNORMAL HIGH (ref 70–99)
Glucose-Capillary: 160 mg/dL — ABNORMAL HIGH (ref 70–99)
Glucose-Capillary: 223 mg/dL — ABNORMAL HIGH (ref 70–99)

## 2013-05-27 LAB — URINE CULTURE

## 2013-05-27 LAB — MAGNESIUM: Magnesium: 1.9 mg/dL (ref 1.5–2.5)

## 2013-05-27 MED ORDER — CIPROFLOXACIN IN D5W 400 MG/200ML IV SOLN
400.0000 mg | Freq: Two times a day (BID) | INTRAVENOUS | Status: DC
  Administered 2013-05-27 – 2013-06-01 (×11): 400 mg via INTRAVENOUS
  Filled 2013-05-27 (×12): qty 200

## 2013-05-27 MED ORDER — LISINOPRIL 20 MG PO TABS
20.0000 mg | ORAL_TABLET | Freq: Every day | ORAL | Status: DC
  Administered 2013-05-27 – 2013-06-02 (×7): 20 mg via ORAL
  Filled 2013-05-27 (×7): qty 1

## 2013-05-27 MED ORDER — POTASSIUM CHLORIDE 20 MEQ/15ML (10%) PO LIQD
40.0000 meq | ORAL | Status: AC
  Administered 2013-05-27 (×3): 40 meq via ORAL
  Filled 2013-05-27 (×7): qty 30

## 2013-05-27 NOTE — Progress Notes (Signed)
CRITICAL VALUE ALERT  Critical value received:  K+ 2.3  Date of notification:  05/27/13  Time of notification:  06:57  Critical value read back:yes  Nurse who received alert:  Lorretta Harp, RN  MD notified (1st page):  Gherghe  Time of first page:  07:00  MD notified (2nd page):  Time of second page:  Responding MD:  Lafe Garin  Time MD responded:  07:00

## 2013-05-27 NOTE — Progress Notes (Signed)
Clinical Social Work Department BRIEF PSYCHOSOCIAL ASSESSMENT 05/27/2013  Patient:  Maria Christian, Maria Christian     Account Number:  [id number]     Admit date:  05/23/2013  Clinical Social Worker:  Hattie Perch  Date/Time:  05/27/2013 12:00 M  Referred by:  Physician  Date Referred:  05/27/2013 Referred for  SNF Placement   Other Referral:   Interview type:  Family Other interview type:    PSYCHOSOCIAL DATA Living Status:  FACILITY Admitted from facility:  [facility] Level of care:  Skilled Nursing Facility Primary support name:  Maria Christian Primary support relationship to patient:  CHILD, ADULT Degree of support available:   good    CURRENT CONCERNS Current Concerns  Post-Acute Placement   Other Concerns:    SOCIAL WORK ASSESSMENT / PLAN CSW met with patient. patient is unable to participate in assessment due to mental status. CSW called patient's son, Maria Christian, he confirms that patient is a long term resident of [facility] and he would like her to return there upon discharge. 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   Assessment/plan status:   Other assessment/ plan:   Information/referral to community resources:    PATIENT'S/FAMILY'S RESPONSE TO PLAN OF CARE: patient's son states he is happy with patient's care at [facility] and would like her to return.

## 2013-05-27 NOTE — Progress Notes (Signed)
PROGRESS NOTE  Maria Christian:[id number] DOB: 1924/12/31 DOA: 05/23/2013 PCP: Karlene Einstein, MD  HPI Maria Christian is a 79 y.o. WF PMHx depression, dementia diastolic CHF, a fibrillation, CAD , HTN, HLD, diabetes type 2, Parkinson's disease,Hx TIA, thrombocytopenia, S/P cholecystectomy,S/P hysterectomy, S/P esophageal dilation. Presented to ED with vomiting and nausea with her abdominal pain for the past 2 weeks per her daughter's. Per daughter patient uses wheelchair to ambulate around, and they are also concerned about a sacral decubitus which has not been addressed. They sent her here for concerns of a "blockage". She is very hard of hearing so the her history is difficult. Her lungs are clear. She has a distended abdomen that is tympanic to percussion in all fields. Diffuse tenderness. Concern for bowel obstruction. AAS showed sigmoid volvulus vs obstruction. CT ordered.CT shows sigmoid volvulus. Surgery consulted, stated this is a GI consult. Dr. Rhea Belton with  GI to see and plan for a unprepped Flexible Sigmoidoscopy. In addition Labs showed hypokalemia. IV potassium initiated. UTI found, rocephin given.   Assessment/Plan: Sigmoid Volvulus - s/p emergent flexible sigmoidoscopy for decompression on 12/20 am. Patient initially with NG tube which was discontinued. Repeat abdominal film 05/25/13 with persistent sigmoid colonic volvulus, asymptomatic. Currently on liquid diet, tolerating that well. Volvulus reduced again 12/22 with colonic decompression tube placement. Abdominal films 12/23 with persistent/recurrent volvulus unfortunately and GI is to consider stenting. Surgery is following, patient is a poor surgical candidate.  UTI - patient was on Ceftriaxone empirically, however urine culture speciated E coli resistant to Ceftriaxone and her antibiotics were switched to Ciprofloxacin on 12/23.  Hypokalemia - persistent, replete, Mg 1.7.  Diastolic CHF - On exam patient appears to be  compensated  Pulmonary hypertension - Stable  HTN - discontinue IV metoprolol since mild bradycardia. Resume Amlodipine and lisinopril given hypertension. Will likely not need Clonidine in the future and would ideally avoid this medication in her.  Atrial fibrillation -Currently in normal sinus rhythm  - Aspirin and Plavix held secondary to sigmoid volvulus  Diabetes type 2 - moderate SSI  Sacral decubitus -Consulted wound care to evaluate and treat  Dementia - mild Depression   Diet: full liquid Fluids: NS DVT Prophylaxis: Lovenox  Code Status: DNR Family Communication: none  Disposition Plan: inpatient  Consultants:  Surgery  GI  Procedures:  Flex sig 12/20  Flex sig 12/22   Antibiotics Ceftriaxone 12/20 >> 12/23 Ciprofloxacin 12/23 >>  HPI/Subjective: - feels good, denies chest pain, breathing difficulties.   Objective: Filed Vitals:   05/26/13 1439 05/26/13 2150 05/27/13 0300 05/27/13 1011  BP: 177/78 149/69 149/65 161/73  Pulse: 95 96 79   Temp: 98.3 F (36.8 C) 98.6 F (37 C) 98.6 F (37 C)   TempSrc: Oral Oral Oral   Resp: 20 22 22    Height:      Weight:   73.5 kg (162 lb 0.6 oz)   SpO2: 100% 100% 98%     Intake/Output Summary (Last 24 hours) at 05/27/13 1027 Last data filed at 05/27/13 0835  Gross per 24 hour  Intake   1970 ml  Output    300 ml  Net   1670 ml   Filed Weights   05/25/13 0403 05/26/13 0449 05/27/13 0300  Weight: 73.8 kg (162 lb 11.2 oz) 75.705 kg (166 lb 14.4 oz) 73.5 kg (162 lb 0.6 oz)    Exam:  General:  NAD  Cardiovascular: iregular, without MRG  Respiratory: good air movement, clear to auscultation throughout,  no wheezing, ronchi or rales  Abdomen: NTTP  MSK: no peripheral edema  Neuro: non focal  Data Reviewed: Basic Metabolic Panel:  Recent Labs Lab 05/24/13 0250 05/24/13 0518 05/24/13 0920 05/25/13 0656 05/25/13 1615 05/26/13 0526 05/27/13 0521  NA  --  143 145 142  --  140 141  K 2.3* 2.6* 2.8*  2.4* 3.2* 2.8* 2.3*  CL  --  109 111 111  --  106 110  CO2  --  24 27 21   --  22 21  GLUCOSE  --  143* 86 136*  --  110* 103*  BUN  --  8 9 8   --  10 7  CREATININE  --  0.81 0.85 0.69  --  0.71 0.63  CALCIUM  --  8.4 8.6 8.6  --  8.6 8.4  MG 1.8 1.8  --  1.7  --  2.3 1.9   Liver Function Tests:  Recent Labs Lab 05/23/13 1910 05/24/13 0518 05/25/13 0656 05/26/13 0526 05/27/13 0521  AST 18 14 14 15 12   ALT 8 7 6 7 6   ALKPHOS 63 53 55 62 57  BILITOT 0.3 0.2* 0.2* 0.4 0.4  PROT 7.0 5.9* 5.8* 6.3 6.0  ALBUMIN 3.8 3.1* 3.0* 3.2* 2.9*   CBC:  Recent Labs Lab 05/23/13 1910 05/24/13 0518 05/25/13 0656 05/26/13 0526  WBC 6.5 6.2 5.5 6.8  NEUTROABS  --  4.3 3.7 4.5  HGB 10.1* 8.7* 8.6* 9.5*  HCT 30.6* 26.2* 25.8* 28.3*  MCV 99.7 100.0 100.0 100.7*  PLT 132* 103* 104* 121*   BNP (last 3 results)  Recent Labs  05/24/13 0250  PROBNP 3915.0*   CBG:  Recent Labs Lab 05/26/13 1615 05/26/13 2011 05/27/13 0020 05/27/13 0418 05/27/13 0742  GLUCAP 193* 178* 223* 98 112*    Recent Results (from the past 240 hour(s))  URINE CULTURE     Status: None   Collection Time    05/23/13  9:20 PM      Result Value Range Status   Specimen Description URINE, CATHETERIZED   Final   Special Requests NONE   Final   Culture  Setup Time     Final   Value: 05/24/2013 02:53     Performed at [facility] Count     Final   Value: >=100,000 COLONIES/ML     Performed at [facility]   Culture     Final   Value: ESCHERICHIA COLI     Note: Confirmed Extended Spectrum Beta-Lactamase Producer (ESBL) CRITICAL RESULT CALLED TO, READ BACK BY AND VERIFIED WITH: ALLY @10AM  12.23.14 BY BUONO     Performed at [facility]   Report Status 05/27/2013 FINAL   Final   Organism ID, Bacteria ESCHERICHIA COLI   Final  MRSA PCR SCREENING     Status: None   Collection Time    05/24/13  4:44 AM      Result Value Range Status   MRSA by PCR NEGATIVE  NEGATIVE Final    Comment:            The GeneXpert MRSA Assay (FDA     approved for NASAL specimens     only), is one component of a     comprehensive MRSA colonization     surveillance program. It is not     intended to diagnose MRSA     infection nor to guide or     monitor treatment for  MRSA infections.     Studies: Dg Shoulder Right  05/25/2013   CLINICAL DATA:  Shoulder pain.  EXAM: RIGHT SHOULDER - 2+ VIEW  COMPARISON:  07/16/2011.  FINDINGS: Old healed slightly angulated right distal clavicular fracture is again noted. Prominent acromioclavicular degenerative change again noted. Glenohumeral degenerative change present. No acute abnormality identified. No evidence of acute fracture, dislocation, or separation.  IMPRESSION: 1. Old healed right clavicular fracture. 2. Prominent degenerative changes about the right shoulder.   Electronically Signed   By: Maisie Fus  Register   On: 05/25/2013 14:30   Dg Abd 2 Views  05/27/2013   CLINICAL DATA:  Abdominal pain, evaluate for volvulus  EXAM: ABDOMEN - 2 VIEW  COMPARISON:  05/26/2013, 05/25/2013  FINDINGS: A rectal tube is been placed in the interim. There has been a component of decompression of the dilated loops of large bowel within the central to right lower quadrant. The configuration of the loops of bowel are unchanged. The sequela of sigmoid volvulus again cannot be excluded considering the orientation of the bowel. There is been partial decompression of remaining loops of large small bowel. Atherosclerotic calcifications identified within the lower thoracic and upper abdominal aorta. Dextroscoliosis identified within the lumbar spine. There is no evidence of free air.  IMPRESSION: The orientation of the loops of bowel within the right lower quadrant is unchanged again raise concern of a volvulus. There has been a component of decompression of the loops of bowel status post rectal tube placement. There is no evidence of free air. Continued surveillance  evaluation is recommended. Atherosclerotic calcifications within the abdominal aorta.   Electronically Signed   By: Salome Holmes M.D.   On: 05/27/2013 07:54   Dg Abd 2 Views  05/26/2013   CLINICAL DATA:  Possible sigmoid volvulus.  EXAM: ABDOMEN - 2 VIEW  COMPARISON:  May 25, 2013.  FINDINGS: There is a persistently distended gas-filled bowel loop in the mid and lower abdomen and upper pelvis similar to that seen previously. There is a moderate amount of gas in the ascending and transverse and descending portions of the colon. No gas is demonstrated within the rectum. No significant small bowel gas is demonstrated. No free extraluminal gas collections are demonstrated. The bony structures exhibit no acute abnormalities.  IMPRESSION: The findings are consistent with persistent sigmoid volvulus. There are no findings to suggest perforation currently.   Electronically Signed   By: David  [place]   On: 05/26/2013 12:59   Dg Abd 2 Views  05/25/2013   CLINICAL DATA:  Sigmoid volvulus. Decompressed 05/24/2013, question recurrence.  EXAM: ABDOMEN - 2 VIEW  COMPARISON:  CT 05/23/2013 .  FINDINGS: Sigmoid colon is distended, there appears to be a sigmoid colonic volvulus. There is mild proximal bowel distention. Surgical clips in the right upper quadrant. No free air is identified. The left hip replacement . These findings were discussed with nurse Marylene Land at 1:31 p.m. on 05/25/2013. The nurse was instructed to inform the patient's physician of these findings.  IMPRESSION: Findings consistent with sigmoid colonic volvulus.   Electronically Signed   By: Maisie Fus  Register   On: 05/25/2013 13:32    Scheduled Meds: . acetaminophen  650 mg Oral TID  . acidophilus  1 capsule Oral Daily  . amLODipine  5 mg Oral Daily  . aspirin EC  81 mg Oral Daily  . ciprofloxacin  400 mg Intravenous Q12H  . diclofenac sodium  2 g Topical QID  . donepezil  5 mg Oral QHS  .  dorzolamide  1 drop Both Eyes TID  . feeding  supplement (RESOURCE BREEZE)  1 Container Oral TID BM  . gabapentin  300 mg Oral BID  . insulin aspart  0-15 Units Subcutaneous Q4H  . ketotifen  1 drop Both Eyes BID  . latanoprost  1 drop Both Eyes QHS  . lip balm  1 application Topical BID  . loratadine  10 mg Oral Daily  . pantoprazole  40 mg Oral Daily  . potassium chloride  40 mEq Oral Q3H  . potassium chloride SA  20 mEq Oral Daily  . sertraline  50 mg Oral QHS  . vitamin C  500 mg Oral Daily   Continuous Infusions: . sodium chloride 50 mL/hr at 05/26/13 1541    Active Problems:   Atrial fibrillation with controlled ventricular response   Diastolic CHF, chronic   Parkinson disease   Hypertension   Hyperlipemia   Diabetes mellitus   Coronary artery disease   Thrombocytopenia   TIA (transient ischemic attack)   Dementia   Depression   Sigmoid volvulus   Pulmonary hypertension   Sacral decubitus ulcer   HOH (hard of hearing)   Pain in right shoulder joint   Volvulus of sigmoid colon  Time spent: 25  Pamella Pert, MD [facility] Pager [phone]. If 7 PM - 7 AM, please contact night-coverage at [email or web address], password [facility] 05/27/2013, 10:27 AM  LOS: 4 days

## 2013-05-27 NOTE — Progress Notes (Signed)
Agree with A&P of KO,PA She has no c/o this afternoon and her abdomen is soft and not tender. She would likely have a difficult time with surgery, so if this can be managed some other way, would be best. Will follow

## 2013-05-27 NOTE — Progress Notes (Signed)
Patient ID: Maria Christian, female   DOB: 06-04-1925, 79 y.o.   MRN: [id number] 1 Day Post-Op  Subjective: Pt's x-rays today reveal recurrent volvulus.  Patient however is asymptomatic at this time.  Her rectal tube was never placed to gravity.    Objective: Vital signs in last 24 hours: Temp:  [98.3 F (36.8 C)-98.6 F (37 C)] 98.6 F (37 C) (12/23 0300) Pulse Rate:  [79-121] 79 (12/23 0300) Resp:  [15-31] 22 (12/23 0300) BP: (146-180)/(65-116) 161/73 mmHg (12/23 1011) SpO2:  [93 %-100 %] 98 % (12/23 0300) Weight:  [162 lb 0.6 oz (73.5 kg)] 162 lb 0.6 oz (73.5 kg) (12/23 0300) Last BM Date: 05/26/13  Intake/Output from previous day: 12/22 0701 - 12/23 0700 In: 1730 [P.O.:480; I.V.:1200; IV Piggyback:50] Out: 300 [Urine:300] Intake/Output this shift: Total I/O In: 240 [P.O.:240] Out: -   PE: Abd: soft, NT, ND, +BS  Lab Results:   Recent Labs  05/25/13 0656 05/26/13 0526  WBC 5.5 6.8  HGB 8.6* 9.5*  HCT 25.8* 28.3*  PLT 104* 121*   BMET  Recent Labs  05/26/13 0526 05/27/13 0521  NA 140 141  K 2.8* 2.3*  CL 106 110  CO2 22 21  GLUCOSE 110* 103*  BUN 10 7  CREATININE 0.71 0.63  CALCIUM 8.6 8.4   PT/INR No results found for this basename: LABPROT, INR,  in the last 72 hours CMP     Component Value Date/Time   NA 141 05/27/2013 0521   K 2.3* 05/27/2013 0521   CL 110 05/27/2013 0521   CO2 21 05/27/2013 0521   GLUCOSE 103* 05/27/2013 0521   BUN 7 05/27/2013 0521   CREATININE 0.63 05/27/2013 0521   CALCIUM 8.4 05/27/2013 0521   PROT 6.0 05/27/2013 0521   ALBUMIN 2.9* 05/27/2013 0521   AST 12 05/27/2013 0521   ALT 6 05/27/2013 0521   ALKPHOS 57 05/27/2013 0521   BILITOT 0.4 05/27/2013 0521   GFRNONAA 79* 05/27/2013 0521   GFRAA >90 05/27/2013 0521   Lipase     Component Value Date/Time   LIPASE 22 04/19/2013 1245       Studies/Results: Dg Shoulder Right  05/25/2013   CLINICAL DATA:  Shoulder pain.  EXAM: RIGHT SHOULDER - 2+ VIEW   COMPARISON:  07/16/2011.  FINDINGS: Old healed slightly angulated right distal clavicular fracture is again noted. Prominent acromioclavicular degenerative change again noted. Glenohumeral degenerative change present. No acute abnormality identified. No evidence of acute fracture, dislocation, or separation.  IMPRESSION: 1. Old healed right clavicular fracture. 2. Prominent degenerative changes about the right shoulder.   Electronically Signed   By: Maisie Fus  Register   On: 05/25/2013 14:30   Dg Abd 2 Views  05/27/2013   CLINICAL DATA:  Abdominal pain, evaluate for volvulus  EXAM: ABDOMEN - 2 VIEW  COMPARISON:  05/26/2013, 05/25/2013  FINDINGS: A rectal tube is been placed in the interim. There has been a component of decompression of the dilated loops of large bowel within the central to right lower quadrant. The configuration of the loops of bowel are unchanged. The sequela of sigmoid volvulus again cannot be excluded considering the orientation of the bowel. There is been partial decompression of remaining loops of large small bowel. Atherosclerotic calcifications identified within the lower thoracic and upper abdominal aorta. Dextroscoliosis identified within the lumbar spine. There is no evidence of free air.  IMPRESSION: The orientation of the loops of bowel within the right lower quadrant is unchanged again raise concern of  a volvulus. There has been a component of decompression of the loops of bowel status post rectal tube placement. There is no evidence of free air. Continued surveillance evaluation is recommended. Atherosclerotic calcifications within the abdominal aorta.   Electronically Signed   By: Salome Holmes M.D.   On: 05/27/2013 07:54   Dg Abd 2 Views  05/26/2013   CLINICAL DATA:  Possible sigmoid volvulus.  EXAM: ABDOMEN - 2 VIEW  COMPARISON:  May 25, 2013.  FINDINGS: There is a persistently distended gas-filled bowel loop in the mid and lower abdomen and upper pelvis similar to that  seen previously. There is a moderate amount of gas in the ascending and transverse and descending portions of the colon. No gas is demonstrated within the rectum. No significant small bowel gas is demonstrated. No free extraluminal gas collections are demonstrated. The bony structures exhibit no acute abnormalities.  IMPRESSION: The findings are consistent with persistent sigmoid volvulus. There are no findings to suggest perforation currently.   Electronically Signed   By: David  [place]   On: 05/26/2013 12:59   Dg Abd 2 Views  05/25/2013   CLINICAL DATA:  Sigmoid volvulus. Decompressed 05/24/2013, question recurrence.  EXAM: ABDOMEN - 2 VIEW  COMPARISON:  CT 05/23/2013 .  FINDINGS: Sigmoid colon is distended, there appears to be a sigmoid colonic volvulus. There is mild proximal bowel distention. Surgical clips in the right upper quadrant. No free air is identified. The left hip replacement . These findings were discussed with nurse Maria Christian at 1:31 p.m. on 05/25/2013. The nurse was instructed to inform the patient's physician of these findings.  IMPRESSION: Findings consistent with sigmoid colonic volvulus.   Electronically Signed   By: Maisie Fus  Register   On: 05/25/2013 13:32    Anti-infectives: Anti-infectives   Start     Dose/Rate Route Frequency Ordered Stop   05/27/13 1100  ciprofloxacin (CIPRO) IVPB 400 mg     400 mg 200 mL/hr over 60 Minutes Intravenous Every 12 hours 05/27/13 1026     05/26/13 0400  cefTRIAXone (ROCEPHIN) 1 g in dextrose 5 % 50 mL IVPB  Status:  Discontinued     1 g 100 mL/hr over 30 Minutes Intravenous Every 24 hours 05/25/13 1518 05/27/13 1026   05/25/13 0300  cefTRIAXone (ROCEPHIN) 1 g in dextrose 5 % 50 mL IVPB  Status:  Discontinued     1 g 100 mL/hr over 30 Minutes Intravenous Every 24 hours 05/24/13 0346 05/25/13 0829   05/24/13 1000  metroNIDAZOLE (FLAGYL) IVPB 500 mg  Status:  Discontinued     500 mg 100 mL/hr over 60 Minutes Intravenous Every 6 hours 05/24/13  0930 05/25/13 0829   05/24/13 0300  cefTRIAXone (ROCEPHIN) 1 g in dextrose 5 % 50 mL IVPB  Status:  Discontinued     1 g 100 mL/hr over 30 Minutes Intravenous Every 24 hours 05/24/13 0249 05/24/13 0346   05/23/13 2315  cefTRIAXone (ROCEPHIN) 1 g in dextrose 5 % 50 mL IVPB     1 g 100 mL/hr over 30 Minutes Intravenous  Once 05/23/13 2300 05/24/13 0313       Assessment/Plan  1. Recurrent sigmoid volvulus, s/p decompression x2 2. Multiple medical problems  Plan: 1. Patient states she doubts she would want to undergo an operation.  She would like for me to speak to her son to see what he thinks. 2. GI is going to discuss the possibility of stenting to see if this may help.  Would like to  maximize conservative measures prior to proceeding with an operation if the family would ultimately like to pursue that.   ADDENDUM: Tried to contact son, Maria Christian, but not answer.  LOS: 4 days    Maria Christian E 05/27/2013, 10:47 AM Pager: [phone]

## 2013-05-27 NOTE — Progress Notes (Signed)
Rectal tube removed per Doug Sou. Will continue to monitor patient .Setzer, Don Broach

## 2013-05-27 NOTE — Progress Notes (Signed)
[facility] Progress Note  Subjective:  Denies pain.  In good spirits.  Does not really want to have surgery.  Objective:  Vital signs in last 24 hours: Temp:  [98.3 F (36.8 C)-98.6 F (37 C)] 98.6 F (37 C) (12/23 0300) Pulse Rate:  [79-121] 79 (12/23 0300) Resp:  [15-31] 22 (12/23 0300) BP: (146-180)/(65-116) 149/65 mmHg (12/23 0300) SpO2:  [93 %-100 %] 98 % (12/23 0300) Weight:  [162 lb 0.6 oz (73.5 kg)] 162 lb 0.6 oz (73.5 kg) (12/23 0300) Last BM Date: 05/26/13 General:  Alert, frail, in NAD; HOH Heart:  Regular rate and rhythm; no murmurs Pulm:  CTAB.  No W/R/R. Abdomen:  Soft, slightly distended.  Very active high-pitched bowel sounds.  Non-tender.  Extremities:  Without edema. Neurologic:  Alert and  oriented x4;  grossly normal neurologically. Psych:  Alert and cooperative. Normal mood and affect.  Intake/Output from previous day: 12/22 0701 - 12/23 0700 In: 1730 [P.O.:480; I.V.:1200; IV Piggyback:50] Out: 300 [Urine:300] Intake/Output this shift: Total I/O In: 240 [P.O.:240] Out: -   Lab Results:  Recent Labs  05/25/13 0656 05/26/13 0526  WBC 5.5 6.8  HGB 8.6* 9.5*  HCT 25.8* 28.3*  PLT 104* 121*   BMET  Recent Labs  05/25/13 0656 05/25/13 1615 05/26/13 0526 05/27/13 0521  NA 142  --  140 141  K 2.4* 3.2* 2.8* 2.3*  CL 111  --  106 110  CO2 21  --  22 21  GLUCOSE 136*  --  110* 103*  BUN 8  --  10 7  CREATININE 0.69  --  0.71 0.63  CALCIUM 8.6  --  8.6 8.4   LFT  Recent Labs  05/27/13 0521  PROT 6.0  ALBUMIN 2.9*  AST 12  ALT 6  ALKPHOS 57  BILITOT 0.4   Dg Shoulder Right  05/25/2013   CLINICAL DATA:  Shoulder pain.  EXAM: RIGHT SHOULDER - 2+ VIEW  COMPARISON:  07/16/2011.  FINDINGS: Old healed slightly angulated right distal clavicular fracture is again noted. Prominent acromioclavicular degenerative change again noted. Glenohumeral degenerative change present. No acute abnormality identified. No evidence of acute  fracture, dislocation, or separation.  IMPRESSION: 1. Old healed right clavicular fracture. 2. Prominent degenerative changes about the right shoulder.   Electronically Signed   By: Maisie Fus  Register   On: 05/25/2013 14:30   Dg Abd 2 Views  05/27/2013   CLINICAL DATA:  Abdominal pain, evaluate for volvulus  EXAM: ABDOMEN - 2 VIEW  COMPARISON:  05/26/2013, 05/25/2013  FINDINGS: A rectal tube is been placed in the interim. There has been a component of decompression of the dilated loops of large bowel within the central to right lower quadrant. The configuration of the loops of bowel are unchanged. The sequela of sigmoid volvulus again cannot be excluded considering the orientation of the bowel. There is been partial decompression of remaining loops of large small bowel. Atherosclerotic calcifications identified within the lower thoracic and upper abdominal aorta. Dextroscoliosis identified within the lumbar spine. There is no evidence of free air.  IMPRESSION: The orientation of the loops of bowel within the right lower quadrant is unchanged again raise concern of a volvulus. There has been a component of decompression of the loops of bowel status post rectal tube placement. There is no evidence of free air. Continued surveillance evaluation is recommended. Atherosclerotic calcifications within the abdominal aorta.   Electronically Signed   By: Salome Holmes M.D.   On: 05/27/2013 07:54  Dg Abd 2 Views  05/26/2013   CLINICAL DATA:  Possible sigmoid volvulus.  EXAM: ABDOMEN - 2 VIEW  COMPARISON:  May 25, 2013.  FINDINGS: There is a persistently distended gas-filled bowel loop in the mid and lower abdomen and upper pelvis similar to that seen previously. There is a moderate amount of gas in the ascending and transverse and descending portions of the colon. No gas is demonstrated within the rectum. No significant small bowel gas is demonstrated. No free extraluminal gas collections are demonstrated. The  bony structures exhibit no acute abnormalities.  IMPRESSION: The findings are consistent with persistent sigmoid volvulus. There are no findings to suggest perforation currently.   Electronically Signed   By: David  [place]   On: 05/26/2013 12:59   Dg Abd 2 Views  05/25/2013   CLINICAL DATA:  Sigmoid volvulus. Decompressed 05/24/2013, question recurrence.  EXAM: ABDOMEN - 2 VIEW  COMPARISON:  CT 05/23/2013 .  FINDINGS: Sigmoid colon is distended, there appears to be a sigmoid colonic volvulus. There is mild proximal bowel distention. Surgical clips in the right upper quadrant. No free air is identified. The left hip replacement . These findings were discussed with nurse Marylene Land at 1:31 p.m. on 05/25/2013. The nurse was instructed to inform the patient's physician of these findings.  IMPRESSION: Findings consistent with sigmoid colonic volvulus.   Electronically Signed   By: Maisie Fus  Register   On: 05/25/2013 13:32    Assessment / Plan: #1 79 yo female s/p decompression of sigmoid volvulus 12/20 and again on 12/22 with decompression tube placement-Rectal tube was never put to gravity and x-ray this AM shows that Volvulus has recurred-patient not symptomatic at present.  Surgery is following but obviously she would not do well with an operation.  Dr. Arlyce Dice is going to research/discuss indication for stenting in this situation.  #2 Severe hypokalemia-replacement per primary service. #3 Parkinsons  #4 CAD  *Continue liquid diet for now.    LOS: 4 days   Nell Schrack D.  05/27/2013, 10:11 AM  Pager number [phone]

## 2013-05-27 NOTE — Progress Notes (Addendum)
There is little change on exam or by x-ray indicating recurrent volvulus.  I will check to see whether colonic stents have been used for pts with recurrent volvulus who are deemed nonsurgical candidates. Percutaneous endoscopic sigmoid colostomy has been done in similar situations and may also be considered.

## 2013-05-27 NOTE — Progress Notes (Signed)
GI Attending Note  I have personally taken an interval history, reviewed the chart, and examined the patient.  I agree with the extender's note, impression and recommendations.  Kalil Woessner D. Toniya Rozar, MD, FACG [facility] [phone]  

## 2013-05-28 DIAGNOSIS — F0390 Unspecified dementia without behavioral disturbance: Secondary | ICD-10-CM

## 2013-05-28 DIAGNOSIS — E119 Type 2 diabetes mellitus without complications: Secondary | ICD-10-CM

## 2013-05-28 DIAGNOSIS — I4891 Unspecified atrial fibrillation: Secondary | ICD-10-CM

## 2013-05-28 DIAGNOSIS — K562 Volvulus: Secondary | ICD-10-CM | POA: Diagnosis not present

## 2013-05-28 LAB — BASIC METABOLIC PANEL
BUN: 6 mg/dL (ref 6–23)
CO2: 21 mEq/L (ref 19–32)
Calcium: 8.6 mg/dL (ref 8.4–10.5)
Creatinine, Ser: 0.66 mg/dL (ref 0.50–1.10)
GFR calc Af Amer: >90 mL/min (ref >90)
Glucose, Bld: 118 mg/dL — ABNORMAL HIGH (ref 70–99)
Sodium: 141 mEq/L (ref 135–145)

## 2013-05-28 LAB — GLUCOSE, CAPILLARY
Glucose-Capillary: 117 mg/dL — ABNORMAL HIGH (ref 70–99)
Glucose-Capillary: 119 mg/dL — ABNORMAL HIGH (ref 70–99)
Glucose-Capillary: 128 mg/dL — ABNORMAL HIGH (ref 70–99)
Glucose-Capillary: 145 mg/dL — ABNORMAL HIGH (ref 70–99)

## 2013-05-28 MED ORDER — POTASSIUM CHLORIDE 20 MEQ/15ML (10%) PO LIQD
40.0000 meq | Freq: Three times a day (TID) | ORAL | Status: AC
  Administered 2013-05-28 (×3): 40 meq via ORAL
  Filled 2013-05-28 (×3): qty 30

## 2013-05-28 NOTE — Progress Notes (Signed)
Agree with A&P of WJ,PA. She appears stable. Discussed with Dr Arlyce Dice looking for alternatives to surgery if she becomes symptomatic again. He doesn't think a stint will help. They are some reported successes with doing a "PEG" but with colon instead of stomach, to fix the colon so it won't twist.. She seems OK today so will continue to monitor. A colon resection might not be survivable.

## 2013-05-28 NOTE — Progress Notes (Signed)
Progress Note   Subjective  Complains of excessive gas  Objective   Vital signs in last 24 hours: Temp:  [98 F (36.7 C)-98.7 F (37.1 C)] 98.7 F (37.1 C) (12/24 0654) Pulse Rate:  [82-99] 99 (12/24 0654) Resp:  [19-20] 19 (12/24 0654) BP: (124-161)/(59-76) 144/76 mmHg (12/24 0654) SpO2:  [97 %-100 %] 97 % (12/24 0654) Weight:  [160 lb 11.5 oz (72.9 kg)] 160 lb 11.5 oz (72.9 kg) (12/24 0654) Last BM Date: 2013-06-05 General:    white female in NAD Cardiac: occasional irregular beat Abdomen:  Soft, mildly distended, a few "metallic" bowel sounds. Not particularly tender. Extremities:  1+ RLE edema. Neurologic:  Alert, grossly normal neurologically. Psych:  Cooperative. Pleasant,  Lab Results:  BMET  Recent Labs  05/26/13 0526 05-Jun-2013 0521 2013/06/05 1707 05/28/13 0538  NA 140 141  --  141  K 2.8* 2.3* 3.3* 3.0*  CL 106 110  --  112  CO2 22 21  --  21  GLUCOSE 110* 103*  --  118*  BUN 10 7  --  6  CREATININE 0.71 0.63  --  0.66  CALCIUM 8.6 8.4  --  8.6   LFT  Recent Labs  06/05/2013 0521  PROT 6.0  ALBUMIN 2.9*  AST 12  ALT 6  ALKPHOS 57  BILITOT 0.4    Studies/Results: Dg Abd 2 Views  05-Jun-2013   CLINICAL DATA:  Abdominal pain, evaluate for volvulus  EXAM: ABDOMEN - 2 VIEW  COMPARISON:  05/26/2013, 05/25/2013  FINDINGS: A rectal tube is been placed in the interim. There has been a component of decompression of the dilated loops of large bowel within the central to right lower quadrant. The configuration of the loops of bowel are unchanged. The sequela of sigmoid volvulus again cannot be excluded considering the orientation of the bowel. There is been partial decompression of remaining loops of large small bowel. Atherosclerotic calcifications identified within the lower thoracic and upper abdominal aorta. Dextroscoliosis identified within the lumbar spine. There is no evidence of free air.  IMPRESSION: The orientation of the loops of bowel within the right  lower quadrant is unchanged again raise concern of a volvulus. There has been a component of decompression of the loops of bowel status post rectal tube placement. There is no evidence of free air. Continued surveillance evaluation is recommended. Atherosclerotic calcifications within the abdominal aorta.   Electronically Signed   By: Salome Holmes M.D.   On: Jun 05, 2013 07:54     Assessment / Plan:   1. sigmoid volvulus, s/p flexible sigmoidoscopy with decompression 12/20 and again 12/22. Rectal tube placed with last flex sig but tube pulled yesterday. Repeat abdominal films yesterday c/w persistent / recurrent volvulus. She is having BMs per RN (last one yesterday). Surgery following but patient is high surgical risk and doesn't want surgery.  Dr. Arlyce Dice researching whether colonic stent may be useful in this situation.   2. Atrial fibrillation, plavix / asa on hold  3. Hypokalemia, persistent. Mg+ level okay at 1.7. Repletion per Hospitalist.  4. Multiple medical problems    LOS: 5 days   [name]  05/28/2013, 10:05 AM  GI Attending Note  I have personally taken an interval history, reviewed the chart, and examined the patient.  She seems to be tolerating her diet and having small BMs.  Colonic stenting is not recommended since, in the absence of a narrowed colon, the stent is very likely to migrate and be expelled.  There are reports of  percutaneous endoscopic sigmoid colostomy in patients with recurrent volvulus who are not surgical candidates.  Percutaneous endoscopic sigmoi plasty has also been done in a similar manner.  At this point would defer any procedures  unless the patient convincingly has recurrent obstruction.  Barbette Hair. Arlyce Dice, MD, [facility] [phone]

## 2013-05-28 NOTE — Progress Notes (Signed)
2 Days Post-Op  Subjective: She says she didn't feel well yesterday, but did eat breakfast, + flatus, and BM's as noted. No complaints of pain right now.  Objective: Vital signs in last 24 hours: Temp:  [98 F (36.7 C)-98.7 F (37.1 C)] 98.7 F (37.1 C) (12/24 0654) Pulse Rate:  [82-99] 99 (12/24 0654) Resp:  [19-20] 19 (12/24 0654) BP: (124-161)/(59-76) 144/76 mmHg (12/24 0654) SpO2:  [97 %-100 %] 97 % (12/24 0654) Weight:  [72.9 kg (160 lb 11.5 oz)] 72.9 kg (160 lb 11.5 oz) (12/24 0654) Last BM Date: 05-31-13 1120 PO intake recorded; 3 stools 12/22 and 2 stools 2023-06-01 recorded. Diet: clears Afebrile, VSS K+ 3.0/Mag 1.7 No film this AM Intake/Output from previous day: 2023-06-01 0701 - 12/24 0700 In: 2300 [P.O.:1120; I.V.:780; IV Piggyback:400] Out: -  Intake/Output this shift:    General appearance: alert, cooperative and no distress GI: soft, somewhat distended, BS hyperactive, no pain or tenderness on exam.  Lab Results:   Recent Labs  05/26/13 0526  WBC 6.8  HGB 9.5*  HCT 28.3*  PLT 121*    BMET  Recent Labs  05-31-13 0521 2013-05-31 1707 05/28/13 0538  NA 141  --  141  K 2.3* 3.3* 3.0*  CL 110  --  112  CO2 21  --  21  GLUCOSE 103*  --  118*  BUN 7  --  6  CREATININE 0.63  --  0.66  CALCIUM 8.4  --  8.6   PT/INR No results found for this basename: LABPROT, INR,  in the last 72 hours   Recent Labs Lab 05/23/13 1910 05/24/13 0518 05/25/13 0656 05/26/13 0526 05-31-13 0521  AST 18 14 14 15 12   ALT 8 7 6 7 6   ALKPHOS 63 53 55 62 57  BILITOT 0.3 0.2* 0.2* 0.4 0.4  PROT 7.0 5.9* 5.8* 6.3 6.0  ALBUMIN 3.8 3.1* 3.0* 3.2* 2.9*     Lipase     Component Value Date/Time   LIPASE 22 04/19/2013 1245     Studies/Results: Dg Abd 2 Views  05/31/2013   CLINICAL DATA:  Abdominal pain, evaluate for volvulus  EXAM: ABDOMEN - 2 VIEW  COMPARISON:  05/26/2013, 05/25/2013  FINDINGS: A rectal tube is been placed in the interim. There has been a component of  decompression of the dilated loops of large bowel within the central to right lower quadrant. The configuration of the loops of bowel are unchanged. The sequela of sigmoid volvulus again cannot be excluded considering the orientation of the bowel. There is been partial decompression of remaining loops of large small bowel. Atherosclerotic calcifications identified within the lower thoracic and upper abdominal aorta. Dextroscoliosis identified within the lumbar spine. There is no evidence of free air.  IMPRESSION: The orientation of the loops of bowel within the right lower quadrant is unchanged again raise concern of a volvulus. There has been a component of decompression of the loops of bowel status post rectal tube placement. There is no evidence of free air. Continued surveillance evaluation is recommended. Atherosclerotic calcifications within the abdominal aorta.   Electronically Signed   By: Salome Holmes M.D.   On: 05/31/2013 07:54   Dg Abd 2 Views  05/26/2013   CLINICAL DATA:  Possible sigmoid volvulus.  EXAM: ABDOMEN - 2 VIEW  COMPARISON:  May 25, 2013.  FINDINGS: There is a persistently distended gas-filled bowel loop in the mid and lower abdomen and upper pelvis similar to that seen previously. There is a  moderate amount of gas in the ascending and transverse and descending portions of the colon. No gas is demonstrated within the rectum. No significant small bowel gas is demonstrated. No free extraluminal gas collections are demonstrated. The bony structures exhibit no acute abnormalities.  IMPRESSION: The findings are consistent with persistent sigmoid volvulus. There are no findings to suggest perforation currently.   Electronically Signed   By: David  [place]   On: 05/26/2013 12:59    Medications: . acetaminophen  650 mg Oral TID  . acidophilus  1 capsule Oral Daily  . amLODipine  5 mg Oral Daily  . aspirin EC  81 mg Oral Daily  . ciprofloxacin  400 mg Intravenous Q12H  . diclofenac  sodium  2 g Topical QID  . donepezil  5 mg Oral QHS  . dorzolamide  1 drop Both Eyes TID  . feeding supplement (RESOURCE BREEZE)  1 Container Oral TID BM  . gabapentin  300 mg Oral BID  . insulin aspart  0-15 Units Subcutaneous Q4H  . ketotifen  1 drop Both Eyes BID  . latanoprost  1 drop Both Eyes QHS  . lip balm  1 application Topical BID  . lisinopril  20 mg Oral Daily  . loratadine  10 mg Oral Daily  . pantoprazole  40 mg Oral Daily  . potassium chloride  40 mEq Oral TID  . sertraline  50 mg Oral QHS  . vitamin C  500 mg Oral Daily    Assessment/Plan 1. Recurrent sigmoid volvulus, s/p decompression x2 2.  UTI 3. Hypokalemia 4.  Hx DCHF/pulmonary hypertension 5.  AF currently in SR, on Plavix/ASA  6.  Hypertension 7.  AODM 8.  Sacral decubitus 9.  Depression/dementia    Plan:   Continue conservative management for now.  LOS: 5 days    Maria Christian 05/28/2013

## 2013-05-28 NOTE — Progress Notes (Addendum)
PROGRESS NOTE  Maria Christian ZOX:[id number] DOB: 11/22/1924 DOA: 05/23/2013 PCP: Karlene Einstein, MD  HPI Maria Christian is a 79 y.o. WF PMHx depression, dementia diastolic CHF, a fibrillation, CAD , HTN, HLD, diabetes type 2, Parkinson's disease,Hx TIA, thrombocytopenia, S/P cholecystectomy,S/P hysterectomy, S/P esophageal dilation. Presented to ED with vomiting and nausea with her abdominal pain for the past 2 weeks per her daughter's. Per daughter patient uses wheelchair to ambulate around, and they are also concerned about a sacral decubitus which has not been addressed. They sent her here for concerns of a "blockage". She is very hard of hearing so the her history is difficult. Her lungs are clear. She has a distended abdomen that is tympanic to percussion in all fields. Diffuse tenderness. Concern for bowel obstruction. AAS showed sigmoid volvulus vs obstruction. CT ordered.CT shows sigmoid volvulus. Surgery consulted, stated this is a GI consult. Dr. Rhea Belton with  GI to see and plan for a unprepped Flexible Sigmoidoscopy. In addition Labs showed hypokalemia. IV potassium initiated. UTI found, rocephin given.   Assessment/Plan: Sigmoid Volvulus - s/p emergent flexible sigmoidoscopy for decompression on 12/20 am. Patient initially with NG tube which was discontinued. Repeat abdominal film 05/25/13 with persistent sigmoid colonic volvulus, asymptomatic. Currently on liquid diet, tolerating that well. Volvulus reduced again 12/22 with colonic decompression tube placement. Abdominal films 12/23 with persistent/recurrent volvulus unfortunately and GI is to consider stenting,but recommended percutaneous endoscopic sigmoid colostomy.  Surgery is following, patient is a poor surgical candidate.  UTI - patient was on Ceftriaxone empirically, however urine culture speciated E coli resistant to Ceftriaxone and her antibiotics were switched to Ciprofloxacin on 12/23.  Hypokalemia - persistent,  replete as needed, Mg 1.9.  Diastolic CHF - On exam patient appears to be compensated  Pulmonary hypertension - Stable  HTN - discontinue IV metoprolol since mild bradycardia. Resume Amlodipine and lisinopril given hypertension. Will likely not need Clonidine in the future and would ideally avoid this medication in her.  Atrial fibrillation -Currently in normal sinus rhythm  - Aspirin and Plavix held secondary to sigmoid volvulus  Diabetes type 2 - moderate SSI  Sacral decubitus -Consulted wound care to evaluate and treat  Dementia - mild Depression : stable.  Diet: full liquid Fluids: NS DVT Prophylaxis: Lovenox  Code Status: DNR Family Communication: none  Disposition Plan: inpatient  Consultants:  Surgery  GI  Procedures:  Flex sig 12/20  Flex sig 12/22   Antibiotics Ceftriaxone 12/20 >> 12/23 Ciprofloxacin 12/23 >>  HPI/Subjective: - feels good, denies chest pain, breathing difficulties.  Feels gassy. No abdominal pain.   Objective: Filed Vitals:   05/27/13 2009 05/28/13 0654 05/28/13 1022 05/28/13 1407  BP: 149/66 144/76 150/72 129/72  Pulse: 87 99  72  Temp: 98 F (36.7 C) 98.7 F (37.1 C)  98.7 F (37.1 C)  TempSrc: Oral Oral  Oral  Resp: 19 19  20   Height:      Weight:  72.9 kg (160 lb 11.5 oz)    SpO2: 100% 97%  97%    Intake/Output Summary (Last 24 hours) at 05/28/13 1541 Last data filed at 05/28/13 1408  Gross per 24 hour  Intake   2220 ml  Output      0 ml  Net   2220 ml   Filed Weights   05/26/13 0449 05/27/13 0300 05/28/13 0654  Weight: 75.705 kg (166 lb 14.4 oz) 73.5 kg (162 lb 0.6 oz) 72.9 kg (160 lb 11.5 oz)    Exam:  General:  NAD  Cardiovascular: iregular, without MRG  Respiratory: good air movement, clear to auscultation throughout, no wheezing, ronchi or rales  Abdomen: NTTP  MSK: no peripheral edema  Neuro: non focal  Data Reviewed: Basic Metabolic Panel:  Recent Labs Lab 05/24/13 0518 05/24/13 0920  05/25/13 0656 05/25/13 1615 05/26/13 0526 05/27/13 0521 05/27/13 1707 05/28/13 0538  NA 143 145 142  --  140 141  --  141  K 2.6* 2.8* 2.4* 3.2* 2.8* 2.3* 3.3* 3.0*  CL 109 111 111  --  106 110  --  112  CO2 24 27 21   --  22 21  --  21  GLUCOSE 143* 86 136*  --  110* 103*  --  118*  BUN 8 9 8   --  10 7  --  6  CREATININE 0.81 0.85 0.69  --  0.71 0.63  --  0.66  CALCIUM 8.4 8.6 8.6  --  8.6 8.4  --  8.6  MG 1.8  --  1.7  --  2.3 1.9  --  1.7   Liver Function Tests:  Recent Labs Lab 05/23/13 1910 05/24/13 0518 05/25/13 0656 05/26/13 0526 05/27/13 0521  AST 18 14 14 15 12   ALT 8 7 6 7 6   ALKPHOS 63 53 55 62 57  BILITOT 0.3 0.2* 0.2* 0.4 0.4  PROT 7.0 5.9* 5.8* 6.3 6.0  ALBUMIN 3.8 3.1* 3.0* 3.2* 2.9*   CBC:  Recent Labs Lab 05/23/13 1910 05/24/13 0518 05/25/13 0656 05/26/13 0526  WBC 6.5 6.2 5.5 6.8  NEUTROABS  --  4.3 3.7 4.5  HGB 10.1* 8.7* 8.6* 9.5*  HCT 30.6* 26.2* 25.8* 28.3*  MCV 99.7 100.0 100.0 100.7*  PLT 132* 103* 104* 121*   BNP (last 3 results)  Recent Labs  05/24/13 0250  PROBNP 3915.0*   CBG:  Recent Labs Lab 05/27/13 1958 05/27/13 2332 05/28/13 0310 05/28/13 0756 05/28/13 1139  GLUCAP 143* 128* 149* 117* 145*    Recent Results (from the past 240 hour(s))  URINE CULTURE     Status: None   Collection Time    05/23/13  9:20 PM      Result Value Range Status   Specimen Description URINE, CATHETERIZED   Final   Special Requests NONE   Final   Culture  Setup Time     Final   Value: 05/24/2013 02:53     Performed at [facility] Count     Final   Value: >=100,000 COLONIES/ML     Performed at [facility]   Culture     Final   Value: ESCHERICHIA COLI     Note: Confirmed Extended Spectrum Beta-Lactamase Producer (ESBL) CRITICAL RESULT CALLED TO, READ BACK BY AND VERIFIED WITH: ALLY @10AM  12.23.14 BY BUONO     Performed at [facility]   Report Status 05/27/2013 FINAL   Final   Organism ID,  Bacteria ESCHERICHIA COLI   Final  MRSA PCR SCREENING     Status: None   Collection Time    05/24/13  4:44 AM      Result Value Range Status   MRSA by PCR NEGATIVE  NEGATIVE Final   Comment:            The GeneXpert MRSA Assay (FDA     approved for NASAL specimens     only), is one component of a     comprehensive MRSA colonization     surveillance program. It  is not     intended to diagnose MRSA     infection nor to guide or     monitor treatment for     MRSA infections.     Studies: Dg Abd 2 Views  05/27/2013   CLINICAL DATA:  Abdominal pain, evaluate for volvulus  EXAM: ABDOMEN - 2 VIEW  COMPARISON:  05/26/2013, 05/25/2013  FINDINGS: A rectal tube is been placed in the interim. There has been a component of decompression of the dilated loops of large bowel within the central to right lower quadrant. The configuration of the loops of bowel are unchanged. The sequela of sigmoid volvulus again cannot be excluded considering the orientation of the bowel. There is been partial decompression of remaining loops of large small bowel. Atherosclerotic calcifications identified within the lower thoracic and upper abdominal aorta. Dextroscoliosis identified within the lumbar spine. There is no evidence of free air.  IMPRESSION: The orientation of the loops of bowel within the right lower quadrant is unchanged again raise concern of a volvulus. There has been a component of decompression of the loops of bowel status post rectal tube placement. There is no evidence of free air. Continued surveillance evaluation is recommended. Atherosclerotic calcifications within the abdominal aorta.   Electronically Signed   By: Salome Holmes M.D.   On: 05/27/2013 07:54    Scheduled Meds: . acetaminophen  650 mg Oral TID  . acidophilus  1 capsule Oral Daily  . amLODipine  5 mg Oral Daily  . aspirin EC  81 mg Oral Daily  . ciprofloxacin  400 mg Intravenous Q12H  . diclofenac sodium  2 g Topical QID  . donepezil   5 mg Oral QHS  . dorzolamide  1 drop Both Eyes TID  . feeding supplement (RESOURCE BREEZE)  1 Container Oral TID BM  . gabapentin  300 mg Oral BID  . insulin aspart  0-15 Units Subcutaneous Q4H  . ketotifen  1 drop Both Eyes BID  . latanoprost  1 drop Both Eyes QHS  . lip balm  1 application Topical BID  . lisinopril  20 mg Oral Daily  . loratadine  10 mg Oral Daily  . pantoprazole  40 mg Oral Daily  . potassium chloride  40 mEq Oral TID  . sertraline  50 mg Oral QHS  . vitamin C  500 mg Oral Daily   Continuous Infusions: . sodium chloride 50 mL/hr at 05/28/13 1022    Active Problems:   Atrial fibrillation with controlled ventricular response   Diastolic CHF, chronic   Parkinson disease   Hypertension   Hyperlipemia   Diabetes mellitus   Coronary artery disease   Thrombocytopenia   TIA (transient ischemic attack)   Dementia   Depression   Sigmoid volvulus   Pulmonary hypertension   Sacral decubitus ulcer   HOH (hard of hearing)   Pain in right shoulder joint   Volvulus of sigmoid colon  Time spent: 25 minutes Kathlen Mody, MD [facility] Pager [phone] If 7 PM - 7 AM, please contact night-coverage at [email or web address], password [facility] 05/28/2013, 3:41 PM  LOS: 5 days

## 2013-05-29 ENCOUNTER — Inpatient Hospital Stay (HOSPITAL_COMMUNITY): Payer: PRIVATE HEALTH INSURANCE

## 2013-05-29 DIAGNOSIS — K562 Volvulus: Secondary | ICD-10-CM | POA: Diagnosis not present

## 2013-05-29 LAB — BASIC METABOLIC PANEL
Calcium: 8.9 mg/dL (ref 8.4–10.5)
Chloride: 114 mEq/L — ABNORMAL HIGH (ref 96–112)
Creatinine, Ser: 0.66 mg/dL (ref 0.50–1.10)
GFR calc Af Amer: >90 mL/min (ref >90)
Glucose, Bld: 87 mg/dL (ref 70–99)
Sodium: 143 mEq/L (ref 135–145)

## 2013-05-29 LAB — GLUCOSE, CAPILLARY
Glucose-Capillary: 105 mg/dL — ABNORMAL HIGH (ref 70–99)
Glucose-Capillary: 111 mg/dL — ABNORMAL HIGH (ref 70–99)
Glucose-Capillary: 163 mg/dL — ABNORMAL HIGH (ref 70–99)
Glucose-Capillary: 166 mg/dL — ABNORMAL HIGH (ref 70–99)
Glucose-Capillary: 87 mg/dL (ref 70–99)

## 2013-05-29 MED ORDER — POTASSIUM CHLORIDE CRYS ER 20 MEQ PO TBCR
40.0000 meq | EXTENDED_RELEASE_TABLET | Freq: Three times a day (TID) | ORAL | Status: AC
  Administered 2013-05-29 (×2): 40 meq via ORAL
  Filled 2013-05-29 (×2): qty 2

## 2013-05-29 NOTE — Progress Notes (Signed)
Progress Note   Subjective  *Resting comfortably.  Tolerating diet.**   Objective  Vital signs in last 24 hours: Temp:  [97.8 F (36.6 C)-98.7 F (37.1 C)] 97.8 F (36.6 C) June 22, 2023 0443) Pulse Rate:  [72-95] 80 22-Jun-2023 0443) Resp:  [20-22] 20 22-Jun-2023 0443) BP: (121-150)/(59-75) 121/59 mmHg 06/22/2023 0443) SpO2:  [96 %-97 %] 97 % 06-22-2023 0443) Weight:  [167 lb 1.7 oz (75.8 kg)] 167 lb 1.7 oz (75.8 kg) 06/22/2023 0443) Last BM Date: 05/27/13 General:   Alert,  Well-developed,  white female in NAD Heart:  Regular rate and rhythm; no murmurs Abdomen:  Soft, nontender  decreased bowel sounds, without guarding, and without rebound.   Extremities:  Without edema. Neurologic:  Alert and  oriented x4;  grossly normal neurologically. Psych:  Alert and cooperative. Normal mood and affect.  Intake/Output from previous day: 12/24 0701 - 06/22/2023 0700 In: 2795.8 [P.O.:1200; I.V.:1195.8; IV Piggyback:400] Out: -  Intake/Output this shift:    Lab Results: No results found for this basename: WBC, HGB, HCT, PLT,  in the last 72 hours BMET  Recent Labs  05/27/13 0521 05/27/13 1707 05/28/13 0538 06/21/2013 0452  NA 141  --  141 143  K 2.3* 3.3* 3.0* 3.2*  CL 110  --  112 114*  CO2 21  --  21 20  GLUCOSE 103*  --  118* 87  BUN 7  --  6 7  CREATININE 0.63  --  0.66 0.66  CALCIUM 8.4  --  8.6 8.9   LFT  Recent Labs  05/27/13 0521  PROT 6.0  ALBUMIN 2.9*  AST 12  ALT 6  ALKPHOS 57  BILITOT 0.4   PT/INR No results found for this basename: LABPROT, INR,  in the last 72 hours Hepatitis Panel No results found for this basename: HEPBSAG, HCVAB, HEPAIGM, HEPBIGM,  in the last 72 hours  Studies/Results: Dg Abd 2 Views  06-21-13   CLINICAL DATA:  History of sigmoid volvulus  EXAM: ABDOMEN - 2 VIEW  COMPARISON:  May 27, 2013  FINDINGS: There remains a moderately distended loop of what is likely the sigmoid colon in the lower abdomen and upper pelvis there is similar in configuration  to that seen on the earlier study. The maximal measured diameter is approximately the 7 cm. There is a small amount of gas within small bowel loops and within loops of the descending colon. Again demonstrated is curvature of the thoracolumbar spine with convexity towards the right which may be positional. A drainage catheter or urinary bladder catheter has been withdrawn.  IMPRESSION: There has not been significant interval change in the appearance of the presumed sigmoid volvulus since the previous study.   Electronically Signed   By: David  [place]   On: 06-21-13 09:14      Assessment & Plan  *Sigmoid volvulus.  Patient is tolerating a diet and having bowel movements.  At this point no intervention is necessary.  May advance diet. ** Active Problems:   Atrial fibrillation with controlled ventricular response   Diastolic CHF, chronic   Parkinson disease   Hypertension   Hyperlipemia   Diabetes mellitus   Coronary artery disease   Thrombocytopenia   TIA (transient ischemic attack)   Dementia   Depression   Sigmoid volvulus   Pulmonary hypertension   Sacral decubitus ulcer   HOH (hard of hearing)   Pain in right shoulder joint   Volvulus of sigmoid colon     LOS: 6 days  Melvia Heaps  05/29/2013, 10:00 AM

## 2013-05-29 NOTE — Progress Notes (Signed)
Patient ID: Maria Christian, female   DOB: 13-Sep-1924, 79 y.o.   MRN: [id number] 3 Days Post-Op  Subjective: States abdomen doing "very well", eating OK and + BMs.  C/O some SOB  Objective: Vital signs in last 24 hours: Temp:  [97.8 F (36.6 C)-98.7 F (37.1 C)] 97.8 F (36.6 C) 06/02/2023 0443) Pulse Rate:  [72-95] 80 06-02-2023 0443) Resp:  [20-22] 20 06-02-23 0443) BP: (121-143)/(59-75) 121/59 mmHg 06-02-23 0443) SpO2:  [96 %-97 %] 97 % 2023-06-02 0443) Weight:  [167 lb 1.7 oz (75.8 kg)] 167 lb 1.7 oz (75.8 kg) 2023/06/02 0443) Last BM Date: 05/27/13  Intake/Output from previous day: 12/24 0701 - Jun 02, 2023 0700 In: 2795.8 [P.O.:1200; I.V.:1195.8; IV Piggyback:400] Out: -  Intake/Output this shift:    General appearance: alert, cooperative and no distress Resp: Does not appear SOB, no increased WOB GI: Mild distention, soft and non tender  Lab Results:  No results found for this basename: WBC, HGB, HCT, PLT,  in the last 72 hours BMET  Recent Labs  05/28/13 0538 06-01-13 0452  NA 141 143  K 3.0* 3.2*  CL 112 114*  CO2 21 20  GLUCOSE 118* 87  BUN 6 7  CREATININE 0.66 0.66  CALCIUM 8.6 8.9     Studies/Results: Dg Abd 2 Views  01-Jun-2013   CLINICAL DATA:  History of sigmoid volvulus  EXAM: ABDOMEN - 2 VIEW  COMPARISON:  May 27, 2013  FINDINGS: There remains a moderately distended loop of what is likely the sigmoid colon in the lower abdomen and upper pelvis there is similar in configuration to that seen on the earlier study. The maximal measured diameter is approximately the 7 cm. There is a small amount of gas within small bowel loops and within loops of the descending colon. Again demonstrated is curvature of the thoracolumbar spine with convexity towards the right which may be positional. A drainage catheter or urinary bladder catheter has been withdrawn.  IMPRESSION: There has not been significant interval change in the appearance of the presumed sigmoid volvulus since the previous  study.   Electronically Signed   By: David  [place]   On: 2013-06-01 09:14    Anti-infectives: Anti-infectives   Start     Dose/Rate Route Frequency Ordered Stop   05/27/13 1100  ciprofloxacin (CIPRO) IVPB 400 mg     400 mg 200 mL/hr over 60 Minutes Intravenous Every 12 hours 05/27/13 1026     05/26/13 0400  cefTRIAXone (ROCEPHIN) 1 g in dextrose 5 % 50 mL IVPB  Status:  Discontinued     1 g 100 mL/hr over 30 Minutes Intravenous Every 24 hours 05/25/13 1518 05/27/13 1026   05/25/13 0300  cefTRIAXone (ROCEPHIN) 1 g in dextrose 5 % 50 mL IVPB  Status:  Discontinued     1 g 100 mL/hr over 30 Minutes Intravenous Every 24 hours 05/24/13 0346 05/25/13 0829   05/24/13 1000  metroNIDAZOLE (FLAGYL) IVPB 500 mg  Status:  Discontinued     500 mg 100 mL/hr over 60 Minutes Intravenous Every 6 hours 05/24/13 0930 05/25/13 0829   05/24/13 0300  cefTRIAXone (ROCEPHIN) 1 g in dextrose 5 % 50 mL IVPB  Status:  Discontinued     1 g 100 mL/hr over 30 Minutes Intravenous Every 24 hours 05/24/13 0249 05/24/13 0346   05/23/13 2315  cefTRIAXone (ROCEPHIN) 1 g in dextrose 5 % 50 mL IVPB     1 g 100 mL/hr over 30 Minutes Intravenous  Once 05/23/13 2300 05/24/13  0981      Assessment/Plan: s/p Procedure(s):  flex with decompression of sigmoid volvulus Stable at present, much less distended than admission.  Colon somewhat dilated on KUB but no clinical evidence of obstruction Agree with expectant management   LOS: 6 days    Gerri Acre T 05/29/2013

## 2013-05-29 NOTE — Progress Notes (Signed)
PROGRESS NOTE  LOVEAH LIKE ZOX:[id number] DOB: February 20, 1925 DOA: 05/23/2013 PCP: Karlene Einstein, MD  HPI Maria Christian is a 79 y.o. WF PMHx depression, dementia diastolic CHF, a fibrillation, CAD , HTN, HLD, diabetes type 2, Parkinson's disease,Hx TIA, thrombocytopenia, S/P cholecystectomy,S/P hysterectomy, S/P esophageal dilation. Presented to ED with vomiting and nausea with her abdominal pain for the past 2 weeks per her daughter's. Per daughter patient uses wheelchair to ambulate around, and they are also concerned about a sacral decubitus which has not been addressed. They sent her here for concerns of a "blockage". She is very hard of hearing so the her history is difficult. Her lungs are clear. She has a distended abdomen that is tympanic to percussion in all fields. Diffuse tenderness. Concern for bowel obstruction. AAS showed sigmoid volvulus vs obstruction. CT ordered.CT shows sigmoid volvulus. Surgery consulted, stated this is a GI consult. Dr. Rhea Belton with [facility] to see and plan for a unprepped Flexible Sigmoidoscopy. In addition Labs showed hypokalemia. IV potassium initiated. UTI found, rocephin given.   Assessment/Plan: Sigmoid Volvulus - s/p emergent flexible sigmoidoscopy for decompression on 12/20 am. Patient initially with NG tube which was discontinued. Repeat abdominal film 05/25/13 with persistent sigmoid colonic volvulus, asymptomatic. Currently on liquid diet, tolerating that well. Volvulus reduced again 12/22 with colonic decompression tube placement. Abdominal films 12/23 with persistent/recurrent volvulus unfortunately and GI is to consider stenting,but recommended percutaneous endoscopic sigmoid colostomy.  Surgery is following, patient is a poor surgical candidate. Currently conservative management.  UTI - patient was on Ceftriaxone empirically, however urine culture speciated E coli resistant to Ceftriaxone and her antibiotics were switched to Ciprofloxacin on  12/23.  Hypokalemia - persistent, replete as needed, Mg 1.9.  Diastolic CHF - On exam patient appears to be compensated  Pulmonary hypertension - Stable  HTN - discontinue IV metoprolol since mild bradycardia. Resume Amlodipine and lisinopril given hypertension. Will likely not need Clonidine in the future and would ideally avoid this medication in her.  Atrial fibrillation -Currently in normal sinus rhythm  - Aspirin and Plavix held secondary to sigmoid volvulus  Diabetes type 2 - moderate SSI  Sacral decubitus -Consulted wound care to evaluate and treat  Dementia - mild Depression : stable.  Diet: full liquid, advance as tolerated.  Fluids: NS DVT Prophylaxis: Lovenox  Code Status: DNR Family Communication: none , discussed th eplan of care with the patient.  Disposition Plan: inpatient  Consultants:  Surgery  GI  Procedures:  Flex sig 12/20  Flex sig 12/22   Antibiotics Ceftriaxone 12/20 >> 12/23 Ciprofloxacin 12/23 >>  HPI/Subjective: - feels good, denies chest pain, breathing difficulties.  Feels gassy. No abdominal pain.   Objective: Filed Vitals:   05/28/13 1407 05/28/13 1840 05/28/13 2013 05/29/13 0443  BP: 129/72  143/75 121/59  Pulse: 72  95 80  Temp: 98.7 F (37.1 C)  98 F (36.7 C) 97.8 F (36.6 C)  TempSrc: Oral  Oral Oral  Resp: 20  22 20   Height:      Weight:    75.8 kg (167 lb 1.7 oz)  SpO2: 97% 96% 96% 97%    Intake/Output Summary (Last 24 hours) at 05/29/13 1907 Last data filed at 05/29/13 1300  Gross per 24 hour  Intake 1712.5 ml  Output    100 ml  Net 1612.5 ml   Filed Weights   05/27/13 0300 05/28/13 0654 05/29/13 0443  Weight: 73.5 kg (162 lb 0.6 oz) 72.9 kg (160 lb 11.5 oz) 75.8  kg (167 lb 1.7 oz)    Exam:  General:  NAD  Cardiovascular: iregular, without MRG  Respiratory: good air movement, clear to auscultation throughout, no wheezing, ronchi or rales  Abdomen: NTTP  MSK: no peripheral edema  Neuro: non  focal  Data Reviewed: Basic Metabolic Panel:  Recent Labs Lab 05/24/13 0518  05/25/13 0656  05/26/13 0526 05/27/13 0521 05/27/13 1707 05/28/13 0538 05/29/13 0452  NA 143  < > 142  --  140 141  --  141 143  K 2.6*  < > 2.4*  < > 2.8* 2.3* 3.3* 3.0* 3.2*  CL 109  < > 111  --  106 110  --  112 114*  CO2 24  < > 21  --  22 21  --  21 20  GLUCOSE 143*  < > 136*  --  110* 103*  --  118* 87  BUN 8  < > 8  --  10 7  --  6 7  CREATININE 0.81  < > 0.69  --  0.71 0.63  --  0.66 0.66  CALCIUM 8.4  < > 8.6  --  8.6 8.4  --  8.6 8.9  MG 1.8  --  1.7  --  2.3 1.9  --  1.7  --   < > = values in this interval not displayed. Liver Function Tests:  Recent Labs Lab 05/23/13 1910 05/24/13 0518 05/25/13 0656 05/26/13 0526 05/27/13 0521  AST 18 14 14 15 12   ALT 8 7 6 7 6   ALKPHOS 63 53 55 62 57  BILITOT 0.3 0.2* 0.2* 0.4 0.4  PROT 7.0 5.9* 5.8* 6.3 6.0  ALBUMIN 3.8 3.1* 3.0* 3.2* 2.9*   CBC:  Recent Labs Lab 05/23/13 1910 05/24/13 0518 05/25/13 0656 05/26/13 0526  WBC 6.5 6.2 5.5 6.8  NEUTROABS  --  4.3 3.7 4.5  HGB 10.1* 8.7* 8.6* 9.5*  HCT 30.6* 26.2* 25.8* 28.3*  MCV 99.7 100.0 100.0 100.7*  PLT 132* 103* 104* 121*   BNP (last 3 results)  Recent Labs  05/24/13 0250  PROBNP 3915.0*   CBG:  Recent Labs Lab 05/29/13 0017 05/29/13 0414 05/29/13 0814 05/29/13 1150 05/29/13 1608  GLUCAP 154* 87 105* 163* 111*    Recent Results (from the past 240 hour(s))  URINE CULTURE     Status: None   Collection Time    05/23/13  9:20 PM      Result Value Range Status   Specimen Description URINE, CATHETERIZED   Final   Special Requests NONE   Final   Culture  Setup Time     Final   Value: 05/24/2013 02:53     Performed at [facility] Count     Final   Value: >=100,000 COLONIES/ML     Performed at [facility]   Culture     Final   Value: ESCHERICHIA COLI     Note: Confirmed Extended Spectrum Beta-Lactamase Producer (ESBL) CRITICAL RESULT  CALLED TO, READ BACK BY AND VERIFIED WITH: ALLY @10AM  12.23.14 BY BUONO     Performed at [facility]   Report Status 05/27/2013 FINAL   Final   Organism ID, Bacteria ESCHERICHIA COLI   Final  MRSA PCR SCREENING     Status: None   Collection Time    05/24/13  4:44 AM      Result Value Range Status   MRSA by PCR NEGATIVE  NEGATIVE Final   Comment:  The GeneXpert MRSA Assay (FDA     approved for NASAL specimens     only), is one component of a     comprehensive MRSA colonization     surveillance program. It is not     intended to diagnose MRSA     infection nor to guide or     monitor treatment for     MRSA infections.     Studies: Dg Abd 2 Views  05/29/2013   CLINICAL DATA:  History of sigmoid volvulus  EXAM: ABDOMEN - 2 VIEW  COMPARISON:  May 27, 2013  FINDINGS: There remains a moderately distended loop of what is likely the sigmoid colon in the lower abdomen and upper pelvis there is similar in configuration to that seen on the earlier study. The maximal measured diameter is approximately the 7 cm. There is a small amount of gas within small bowel loops and within loops of the descending colon. Again demonstrated is curvature of the thoracolumbar spine with convexity towards the right which may be positional. A drainage catheter or urinary bladder catheter has been withdrawn.  IMPRESSION: There has not been significant interval change in the appearance of the presumed sigmoid volvulus since the previous study.   Electronically Signed   By: David  [place]   On: 05/29/2013 09:14    Scheduled Meds: . acetaminophen  650 mg Oral TID  . acidophilus  1 capsule Oral Daily  . amLODipine  5 mg Oral Daily  . aspirin EC  81 mg Oral Daily  . ciprofloxacin  400 mg Intravenous Q12H  . diclofenac sodium  2 g Topical QID  . donepezil  5 mg Oral QHS  . dorzolamide  1 drop Both Eyes TID  . feeding supplement (RESOURCE BREEZE)  1 Container Oral TID BM  . gabapentin  300 mg  Oral BID  . insulin aspart  0-15 Units Subcutaneous Q4H  . ketotifen  1 drop Both Eyes BID  . latanoprost  1 drop Both Eyes QHS  . lip balm  1 application Topical BID  . lisinopril  20 mg Oral Daily  . loratadine  10 mg Oral Daily  . pantoprazole  40 mg Oral Daily  . sertraline  50 mg Oral QHS  . vitamin C  500 mg Oral Daily   Continuous Infusions:    Active Problems:   Atrial fibrillation with controlled ventricular response   Diastolic CHF, chronic   Parkinson disease   Hypertension   Hyperlipemia   Diabetes mellitus   Coronary artery disease   Thrombocytopenia   TIA (transient ischemic attack)   Dementia   Depression   Sigmoid volvulus   Pulmonary hypertension   Sacral decubitus ulcer   HOH (hard of hearing)   Pain in right shoulder joint   Volvulus of sigmoid colon  Time spent: 25 minutes Kathlen Mody, MD [facility] Pager [phone] If 7 PM - 7 AM, please contact night-coverage at [email or web address], password [facility] 05/29/2013, 7:07 PM  LOS: 6 days

## 2013-05-30 DIAGNOSIS — K562 Volvulus: Secondary | ICD-10-CM | POA: Diagnosis not present

## 2013-05-30 LAB — BASIC METABOLIC PANEL
BUN: 6 mg/dL (ref 6–23)
Calcium: 8.9 mg/dL (ref 8.4–10.5)
Creatinine, Ser: 0.67 mg/dL (ref 0.50–1.10)
GFR calc Af Amer: 90 mL/min — ABNORMAL LOW (ref >90)
Potassium: 3.3 mEq/L — ABNORMAL LOW (ref 3.5–5.1)

## 2013-05-30 LAB — GLUCOSE, CAPILLARY
Glucose-Capillary: 114 mg/dL — ABNORMAL HIGH (ref 70–99)
Glucose-Capillary: 123 mg/dL — ABNORMAL HIGH (ref 70–99)
Glucose-Capillary: 148 mg/dL — ABNORMAL HIGH (ref 70–99)
Glucose-Capillary: 162 mg/dL — ABNORMAL HIGH (ref 70–99)

## 2013-05-30 MED ORDER — ENSURE COMPLETE PO LIQD
237.0000 mL | Freq: Two times a day (BID) | ORAL | Status: DC
  Administered 2013-05-30 – 2013-06-01 (×3): 237 mL via ORAL

## 2013-05-30 NOTE — Progress Notes (Signed)
Clinical Social Work  Per chart review, patient not medically stable to DC. CSW will continue to follow and will assist with DC back to [facility] when medically stable.  Unk Lightning, LCSW  (Coverage for [employer])

## 2013-05-30 NOTE — Progress Notes (Signed)
<principal problem not specified>  Assessment: Sigmoid volvulus, clinically without obstruction  Plan: Will continue to follow. Hi rish for any intervention, and seems to be without abd symptoms today   Subjective: Had a "bad night" but apparently not from abdomen. Couldn't sleep. Denies abd pain and says she had large BM  Objective: Vital signs in last 24 hours: Temp:  [98 F (36.7 C)-98.4 F (36.9 C)] 98.4 F (36.9 C) (12/26 0529) Pulse Rate:  [88-92] 88 (12/26 0529) Resp:  [18-20] 20 (12/26 0529) BP: (150-158)/(70-73) 150/70 mmHg (12/26 0529) SpO2:  [97 %] 97 % (12/26 0529) Weight:  [168 lb 14 oz (76.6 kg)] 168 lb 14 oz (76.6 kg) (12/26 0609) Last BM Date: 05/27/13  Intake/Output from previous day: 12/25 0701 - 12/26 0700 In: 500 [IV Piggyback:400] Out: 100 [Urine:100]  General appearance: alert, cooperative and no distress GI: Soft and not tender, not particularly distended, BS+  Lab Results:  Results for orders placed during the hospital encounter of 05/23/13 (from the past 24 hour(s))  GLUCOSE, CAPILLARY     Status: Abnormal   Collection Time    05/29/13 11:50 AM      Result Value Range   Glucose-Capillary 163 (*) 70 - 99 mg/dL   Comment 1 Notify RN     Comment 2 Documented in Chart    GLUCOSE, CAPILLARY     Status: Abnormal   Collection Time    05/29/13  4:08 PM      Result Value Range   Glucose-Capillary 111 (*) 70 - 99 mg/dL  GLUCOSE, CAPILLARY     Status: Abnormal   Collection Time    05/29/13  8:19 PM      Result Value Range   Glucose-Capillary 162 (*) 70 - 99 mg/dL   Comment 1 Notify RN     Comment 2 Documented in Chart    GLUCOSE, CAPILLARY     Status: Abnormal   Collection Time    05/29/13 11:50 PM      Result Value Range   Glucose-Capillary 166 (*) 70 - 99 mg/dL   Comment 1 Notify RN     Comment 2 Documented in Chart    GLUCOSE, CAPILLARY     Status: Abnormal   Collection Time    05/30/13  4:20 AM      Result Value Range   Glucose-Capillary 114 (*) 70 - 99 mg/dL  GLUCOSE, CAPILLARY     Status: Abnormal   Collection Time    05/30/13  7:43 AM      Result Value Range   Glucose-Capillary 123 (*) 70 - 99 mg/dL     Studies/Results Radiology     MEDS, Scheduled . acetaminophen  650 mg Oral TID  . acidophilus  1 capsule Oral Daily  . amLODipine  5 mg Oral Daily  . aspirin EC  81 mg Oral Daily  . ciprofloxacin  400 mg Intravenous Q12H  . diclofenac sodium  2 g Topical QID  . donepezil  5 mg Oral QHS  . dorzolamide  1 drop Both Eyes TID  . feeding supplement (RESOURCE BREEZE)  1 Container Oral TID BM  . gabapentin  300 mg Oral BID  . insulin aspart  0-15 Units Subcutaneous Q4H  . ketotifen  1 drop Both Eyes BID  . latanoprost  1 drop Both Eyes QHS  . lip balm  1 application Topical BID  . lisinopril  20 mg Oral Daily  . loratadine  10 mg Oral Daily  . pantoprazole  40 mg Oral Daily  . sertraline  50 mg Oral QHS  . vitamin C  500 mg Oral Daily       LOS: 7 days    Currie Paris, MD, [facility], [place] [phone]   05/30/2013 8:21 AM

## 2013-05-30 NOTE — Progress Notes (Signed)
PROGRESS NOTE  [name]:[id number] DOB: 1925-04-28 DOA: 05/23/2013 PCP: Karlene Einstein, MD  HPI Maria Christian is a 79 y.o. WF PMHx depression, dementia diastolic CHF, a fibrillation, CAD , HTN, HLD, diabetes type 2, Parkinson's disease,Hx TIA, thrombocytopenia, S/P cholecystectomy,S/P hysterectomy, S/P esophageal dilation. Presented to ED with vomiting and nausea with her abdominal pain for the past 2 weeks per her daughter's. Per daughter patient uses wheelchair to ambulate around, and they are also concerned about a sacral decubitus which has not been addressed. They sent her here for concerns of a "blockage". She is very hard of hearing so the her history is difficult. Her lungs are clear. She has a distended abdomen that is tympanic to percussion in all fields. Diffuse tenderness. Concern for bowel obstruction. AAS showed sigmoid volvulus vs obstruction. CT ordered.CT shows sigmoid volvulus. Surgery consulted, stated this is a GI consult. Dr. Rhea Belton with [facility] to see and plan for a unprepped Flexible Sigmoidoscopy. In addition Labs showed hypokalemia. IV potassium initiated. UTI found, rocephin given.   Assessment/Plan: Sigmoid Volvulus - s/p emergent flexible sigmoidoscopy for decompression on 12/20 am. Patient initially with NG tube which was discontinued. Repeat abdominal film 05/25/13 with persistent sigmoid colonic volvulus, asymptomatic. Currently on liquid diet, tolerating that well. Volvulus reduced again 12/22 with colonic decompression tube placement. Abdominal films 12/23 with persistent/recurrent volvulus unfortunately and GI is to consider stenting,but recommended percutaneous endoscopic sigmoid colostomy.  Surgery is following, patient is a poor surgical candidate. Currently conservative management.  UTI - patient was on Ceftriaxone empirically, however urine culture speciated E coli resistant to Ceftriaxone and her antibiotics were switched to Ciprofloxacin on  12/23.  Hypokalemia - persistent, replete as needed, Mg 1.9.  Diastolic CHF - On exam patient appears to be compensated  Pulmonary hypertension - Stable  HTN - discontinue IV metoprolol since mild bradycardia. Resume Amlodipine and lisinopril given hypertension. Will likely not need Clonidine in the future and would ideally avoid this medication in her.  Atrial fibrillation -Currently in normal sinus rhythm  - Aspirin and Plavix held secondary to sigmoid volvulus  Diabetes type 2 - moderate SSI  Sacral decubitus -Consulted wound care to evaluate and treat  Dementia - mild Depression : stable.  Diet: full liquid, advance as tolerated.  Fluids: NS DVT Prophylaxis: Lovenox  Code Status: DNR Family Communication: none , discussed th eplan of care with the patient.  Disposition Plan: inpatient  Consultants:  Surgery  GI  Procedures:  Flex sig 12/20  Flex sig 12/22   Antibiotics Ceftriaxone 12/20 >> 12/23 Ciprofloxacin 12/23 >>  HPI/Subjective: - reports did not sleep at night. Requesting pain medication.   Objective: Filed Vitals:   05/29/13 2217 05/30/13 0529 05/30/13 0609 05/30/13 1302  BP: 158/73 150/70  139/67  Pulse: 92 88  92  Temp: 98 F (36.7 C) 98.4 F (36.9 C)  98.4 F (36.9 C)  TempSrc: Oral Oral  Oral  Resp: 18 20  18   Height:      Weight:   76.6 kg (168 lb 14 oz)   SpO2: 97% 97%  98%    Intake/Output Summary (Last 24 hours) at 05/30/13 1529 Last data filed at 05/30/13 1402  Gross per 24 hour  Intake    300 ml  Output    150 ml  Net    150 ml   Filed Weights   05/28/13 0654 05/29/13 0443 05/30/13 0609  Weight: 72.9 kg (160 lb 11.5 oz) 75.8 kg (167 lb 1.7  oz) 76.6 kg (168 lb 14 oz)    Exam:  General:  NAD  Cardiovascular: iregular, without MRG  Respiratory: good air movement, clear to auscultation throughout, no wheezing, ronchi or rales  Abdomen: NTTP  MSK: no peripheral edema  Neuro: non focal  Data Reviewed: Basic Metabolic  Panel:  Recent Labs Lab 05/24/13 0518  05/25/13 0656  05/26/13 0526 05/27/13 0521 05/27/13 1707 05/28/13 0538 05/29/13 0452 05/30/13 0853  NA 143  < > 142  --  140 141  --  141 143 139  K 2.6*  < > 2.4*  < > 2.8* 2.3* 3.3* 3.0* 3.2* 3.3*  CL 109  < > 111  --  106 110  --  112 114* 109  CO2 24  < > 21  --  22 21  --  21 20 19   GLUCOSE 143*  < > 136*  --  110* 103*  --  118* 87 182*  BUN 8  < > 8  --  10 7  --  6 7 6   CREATININE 0.81  < > 0.69  --  0.71 0.63  --  0.66 0.66 0.67  CALCIUM 8.4  < > 8.6  --  8.6 8.4  --  8.6 8.9 8.9  MG 1.8  --  1.7  --  2.3 1.9  --  1.7  --   --   < > = values in this interval not displayed. Liver Function Tests:  Recent Labs Lab 05/23/13 1910 05/24/13 0518 05/25/13 0656 05/26/13 0526 05/27/13 0521  AST 18 14 14 15 12   ALT 8 7 6 7 6   ALKPHOS 63 53 55 62 57  BILITOT 0.3 0.2* 0.2* 0.4 0.4  PROT 7.0 5.9* 5.8* 6.3 6.0  ALBUMIN 3.8 3.1* 3.0* 3.2* 2.9*   CBC:  Recent Labs Lab 05/23/13 1910 05/24/13 0518 05/25/13 0656 05/26/13 0526  WBC 6.5 6.2 5.5 6.8  NEUTROABS  --  4.3 3.7 4.5  HGB 10.1* 8.7* 8.6* 9.5*  HCT 30.6* 26.2* 25.8* 28.3*  MCV 99.7 100.0 100.0 100.7*  PLT 132* 103* 104* 121*   BNP (last 3 results)  Recent Labs  05/24/13 0250  PROBNP 3915.0*   CBG:  Recent Labs Lab 05/29/13 2019 05/29/13 2350 05/30/13 0420 05/30/13 0743 05/30/13 1126  GLUCAP 162* 166* 114* 123* 148*    Recent Results (from the past 240 hour(s))  URINE CULTURE     Status: None   Collection Time    05/23/13  9:20 PM      Result Value Range Status   Specimen Description URINE, CATHETERIZED   Final   Special Requests NONE   Final   Culture  Setup Time     Final   Value: 05/24/2013 02:53     Performed at [facility] Count     Final   Value: >=100,000 COLONIES/ML     Performed at [facility]   Culture     Final   Value: ESCHERICHIA COLI     Note: Confirmed Extended Spectrum Beta-Lactamase Producer (ESBL)  CRITICAL RESULT CALLED TO, READ BACK BY AND VERIFIED WITH: ALLY @10AM  12.23.14 BY BUONO     Performed at [facility]   Report Status 05/27/2013 FINAL   Final   Organism ID, Bacteria ESCHERICHIA COLI   Final  MRSA PCR SCREENING     Status: None   Collection Time    05/24/13  4:44 AM      Result Value  Range Status   MRSA by PCR NEGATIVE  NEGATIVE Final   Comment:            The GeneXpert MRSA Assay (FDA     approved for NASAL specimens     only), is one component of a     comprehensive MRSA colonization     surveillance program. It is not     intended to diagnose MRSA     infection nor to guide or     monitor treatment for     MRSA infections.     Studies: Dg Abd 2 Views  05/29/2013   CLINICAL DATA:  History of sigmoid volvulus  EXAM: ABDOMEN - 2 VIEW  COMPARISON:  May 27, 2013  FINDINGS: There remains a moderately distended loop of what is likely the sigmoid colon in the lower abdomen and upper pelvis there is similar in configuration to that seen on the earlier study. The maximal measured diameter is approximately the 7 cm. There is a small amount of gas within small bowel loops and within loops of the descending colon. Again demonstrated is curvature of the thoracolumbar spine with convexity towards the right which may be positional. A drainage catheter or urinary bladder catheter has been withdrawn.  IMPRESSION: There has not been significant interval change in the appearance of the presumed sigmoid volvulus since the previous study.   Electronically Signed   By: David  [place]   On: 05/29/2013 09:14    Scheduled Meds: . acetaminophen  650 mg Oral TID  . acidophilus  1 capsule Oral Daily  . amLODipine  5 mg Oral Daily  . aspirin EC  81 mg Oral Daily  . ciprofloxacin  400 mg Intravenous Q12H  . diclofenac sodium  2 g Topical QID  . donepezil  5 mg Oral QHS  . dorzolamide  1 drop Both Eyes TID  . feeding supplement (ENSURE COMPLETE)  237 mL Oral BID BM  . gabapentin   300 mg Oral BID  . insulin aspart  0-15 Units Subcutaneous Q4H  . ketotifen  1 drop Both Eyes BID  . latanoprost  1 drop Both Eyes QHS  . lip balm  1 application Topical BID  . lisinopril  20 mg Oral Daily  . loratadine  10 mg Oral Daily  . pantoprazole  40 mg Oral Daily  . sertraline  50 mg Oral QHS  . vitamin C  500 mg Oral Daily   Continuous Infusions:    Active Problems:   Atrial fibrillation with controlled ventricular response   Diastolic CHF, chronic   Parkinson disease   Hypertension   Hyperlipemia   Diabetes mellitus   Coronary artery disease   Thrombocytopenia   TIA (transient ischemic attack)   Dementia   Depression   Sigmoid volvulus   Pulmonary hypertension   Sacral decubitus ulcer   HOH (hard of hearing)   Pain in right shoulder joint   Volvulus of sigmoid colon  Time spent: 25 minutes Kathlen Mody, MD [facility] Pager [phone] If 7 PM - 7 AM, please contact night-coverage at [email or web address], password [facility] 05/30/2013, 3:29 PM  LOS: 7 days

## 2013-05-30 NOTE — Progress Notes (Signed)
NUTRITION FOLLOW UP  Intervention:   Provide Ensure Complete BID Encourage PO intake  Nutrition Dx:   Inadequate oral intake related to altered gi function as evidenced by volvulous; improving  Goal:   Tolerate diet advancement. Meet >75% estimated needs with meals and supplements; improving PO intake, no intake of supplements  Monitor:   Diet advancement; dysphagia 3 Labs; low GFR, low potassium Weight; up 16 lbs from usual weight Intake; 100% of some meals  Assessment:   79 yo female s/p decompression of sigmoid volvulus 12/20- Suspect Volvulus has recurred- not symptomatic at present  Per MD note, pt is having BM's and tolerating diet.  Pt reports that she still has a poor appetite but, is eating better. Two Resource breeze supplements at bedside untouched- pt states she doesn't like them. Pt reports eating grits and jello for breakfast and tomato soup and jello for lunch. Assisted pt in ordering a more balanced dinner and breakfast. Pt requesting Ensure instead of Resource breeze. Per nursing notes, pt ate 100% of 3 meals on 12/24 but, no other recent PO intake recorded.   Height: Ht Readings from Last 1 Encounters:  05/24/13 5\' 5"  (1.651 m)    Weight Status:   Wt Readings from Last 1 Encounters:  05/30/13 168 lb 14 oz (76.6 kg)    Re-estimated needs:  Kcal: 1600-1700  Protein: 70-80 gm  Fluid: >1.7L   Skin: non-pitting RLE edema; stage 2 sacral ulcer  Diet Order: Dysphagia   Intake/Output Summary (Last 24 hours) at 05/30/13 1512 Last data filed at 05/30/13 1402  Gross per 24 hour  Intake    300 ml  Output    150 ml  Net    150 ml    Last BM: 12/23 per nursing notes   Labs:   Recent Labs Lab 05/26/13 0526 05/27/13 0521  05/28/13 0538 05/29/13 0452 05/30/13 0853  NA 140 141  --  141 143 139  K 2.8* 2.3*  < > 3.0* 3.2* 3.3*  CL 106 110  --  112 114* 109  CO2 22 21  --  21 20 19   BUN 10 7  --  6 7 6   CREATININE 0.71 0.63  --  0.66 0.66 0.67   CALCIUM 8.6 8.4  --  8.6 8.9 8.9  MG 2.3 1.9  --  1.7  --   --   GLUCOSE 110* 103*  --  118* 87 182*  < > = values in this interval not displayed.  CBG (last 3)   Recent Labs  05/30/13 0420 05/30/13 0743 05/30/13 1126  GLUCAP 114* 123* 148*    Scheduled Meds: . acetaminophen  650 mg Oral TID  . acidophilus  1 capsule Oral Daily  . amLODipine  5 mg Oral Daily  . aspirin EC  81 mg Oral Daily  . ciprofloxacin  400 mg Intravenous Q12H  . diclofenac sodium  2 g Topical QID  . donepezil  5 mg Oral QHS  . dorzolamide  1 drop Both Eyes TID  . feeding supplement (RESOURCE BREEZE)  1 Container Oral TID BM  . gabapentin  300 mg Oral BID  . insulin aspart  0-15 Units Subcutaneous Q4H  . ketotifen  1 drop Both Eyes BID  . latanoprost  1 drop Both Eyes QHS  . lip balm  1 application Topical BID  . lisinopril  20 mg Oral Daily  . loratadine  10 mg Oral Daily  . pantoprazole  40 mg Oral Daily  .  sertraline  50 mg Oral QHS  . vitamin C  500 mg Oral Daily    Continuous Infusions:   Ian Malkin RD, LDN Inpatient Clinical Dietitian Pager: [phone] After Hours Pager: [phone]

## 2013-05-30 NOTE — Progress Notes (Signed)
    Progress Note   Subjective  No specific complaints   Objective   Vital signs in last 24 hours: Temp:  [98 F (36.7 C)-98.4 F (36.9 C)] 98.4 F (36.9 C) (12/26 0529) Pulse Rate:  [88-92] 88 (12/26 0529) Resp:  [18-20] 20 (12/26 0529) BP: (150-158)/(70-73) 150/70 mmHg (12/26 0529) SpO2:  [97 %] 97 % (12/26 0529) Weight:  [168 lb 14 oz (76.6 kg)] 168 lb 14 oz (76.6 kg) (12/26 0609) Last BM Date: 05/27/13 General:    white female in NAD Abdomen:  Soft, nontender and nondistended. Normal bowel sounds. Psych:  Cooperative. Normal mood and affect.  BMET  Recent Labs  05/28/13 0538 2013/06/01 0452 05/30/13 0853  NA 141 143 139  K 3.0* 3.2* 3.3*  CL 112 114* 109  CO2 21 20 19   GLUCOSE 118* 87 182*  BUN 6 7 6   CREATININE 0.66 0.66 0.67  CALCIUM 8.6 8.9 8.9   Studies/Results: Dg Abd 2 Views  06-01-2013   CLINICAL DATA:  History of sigmoid volvulus  EXAM: ABDOMEN - 2 VIEW  COMPARISON:  May 27, 2013  FINDINGS: There remains a moderately distended loop of what is likely the sigmoid colon in the lower abdomen and upper pelvis there is similar in configuration to that seen on the earlier study. The maximal measured diameter is approximately the 7 cm. There is a small amount of gas within small bowel loops and within loops of the descending colon. Again demonstrated is curvature of the thoracolumbar spine with convexity towards the right which may be positional. A drainage catheter or urinary bladder catheter has been withdrawn.  IMPRESSION: There has not been significant interval change in the appearance of the presumed sigmoid volvulus since the previous study.   Electronically Signed   By: David  [place]   On: 2013-06-01 09:14     Assessment / Plan:    Persistent sigmoid volvulus, s/p flexible sigmoidoscopy with decompression x2.  Clinically patient is doing okay without obstructive symptoms. She is having BMs and tolerating diet.  Surgery following but patient is high surgical  risk and doesn't want surgery. Per Dr. Arlyce Dice colonic stenting is not recommended since, in the absence of a narrowed colon, the stent is very likely to migrate and be expelled. There are reports of percutaneous endoscopic sigmoid colostomy in patients with recurrent volvulus who are not surgical candidates. Percutaneous endoscopic sigmoi plasty has also been done in a similar manner. At this point would defer any procedures unless the patient convincingly has recurrent obstruction. Will see again Monday, or sooner if becomes symptomatic.     LOS: 7 days   [name]  05/30/2013, 9:48 AM  GI Attending Note  I have personally taken an interval history, reviewed the chart, and examined the patient.  I agree with the extender's note, impression and recommendations.  Barbette Hair. Arlyce Dice, MD, [facility] [phone]

## 2013-05-31 DIAGNOSIS — K562 Volvulus: Secondary | ICD-10-CM | POA: Diagnosis not present

## 2013-05-31 LAB — GLUCOSE, CAPILLARY
Glucose-Capillary: 102 mg/dL — ABNORMAL HIGH (ref 70–99)
Glucose-Capillary: 116 mg/dL — ABNORMAL HIGH (ref 70–99)
Glucose-Capillary: 127 mg/dL — ABNORMAL HIGH (ref 70–99)
Glucose-Capillary: 130 mg/dL — ABNORMAL HIGH (ref 70–99)
Glucose-Capillary: 178 mg/dL — ABNORMAL HIGH (ref 70–99)
Glucose-Capillary: 183 mg/dL — ABNORMAL HIGH (ref 70–99)

## 2013-05-31 NOTE — Progress Notes (Signed)
Patient ID: Maria Christian, female   DOB: 10-31-1924, 79 y.o.   MRN: [id number]  General Surgery - [facility] - Progress Note  Subjective: Patient comfortable.  No complaints.  Awakens from sleep.  Oriented.  Denies abdominal pain.  Objective: Vital signs in last 24 hours: Temp:  [98.1 F (36.7 C)-98.4 F (36.9 C)] 98.1 F (36.7 C) (12/27 0424) Pulse Rate:  [78-92] 78 (12/27 0424) Resp:  [11-18] 18 (12/27 0424) BP: (139-155)/(67-81) 150/81 mmHg (12/27 0424) SpO2:  [96 %-98 %] 96 % (12/27 0424) Weight:  [167 lb 12.3 oz (76.1 kg)] 167 lb 12.3 oz (76.1 kg) (12/27 0500) Last BM Date: 05/27/13  Intake/Output from previous day: 12/26 0701 - 12/27 0700 In: 120 [P.O.:120] Out: 150 [Urine:150]  Exam: HEENT - clear, not icteric Neck - soft Chest - clear bilaterally Cor - RRR, no murmur Abd - protuberant, not tense; BS present; no mass; no tenderness Ext - no significant edema Neuro - grossly intact, no focal deficits; hard of hearing  Lab Results:  No results found for this basename: WBC, HGB, HCT, PLT,  in the last 72 hours   Recent Labs  05/29/13 0452 05/30/13 0853  NA 143 139  K 3.2* 3.3*  CL 114* 109  CO2 20 19  GLUCOSE 87 182*  BUN 7 6  CREATININE 0.66 0.67  CALCIUM 8.9 8.9    Studies/Results: No results found.  Assessment / Plan: 1.  Sigmoid volvulus  Not obstructed at present  Clinical exam benign  Discussed with Dr. Ovidio Kin  Reviewed GI recommendations and possible candidate for percutaneous procedure if necessary  Little to offer from surgical standpoint at present - will sign off.  Please call if needed.  Velora Heckler, MD, [facility] Office: [phone]  05/31/2013

## 2013-05-31 NOTE — Progress Notes (Signed)
PROGRESS NOTE  Maria Christian JXB:[id number] DOB: 1925/01/10 DOA: 05/23/2013 PCP: Karlene Einstein, MD  HPI Maria Christian is a 79 y.o. WF PMHx depression, dementia diastolic CHF, a fibrillation, CAD , HTN, HLD, diabetes type 2, Parkinson's disease,Hx TIA, thrombocytopenia, S/P cholecystectomy,S/P hysterectomy, S/P esophageal dilation. Presented to ED with vomiting and nausea with her abdominal pain for the past 2 weeks per her daughter's. Per daughter patient uses wheelchair to ambulate around, and they are also concerned about a sacral decubitus which has not been addressed. They sent her here for concerns of a "blockage". She is very hard of hearing so the her history is difficult. Her lungs are clear. She has a distended abdomen that is tympanic to percussion in all fields. Diffuse tenderness. Concern for bowel obstruction. AAS showed sigmoid volvulus vs obstruction. CT ordered.CT shows sigmoid volvulus. Surgery consulted, stated this is a GI consult. Dr. Rhea Belton with [facility] to see and plan for a unprepped Flexible Sigmoidoscopy. In addition Labs showed hypokalemia. IV potassium initiated. UTI found, rocephin given.   Assessment/Plan: Sigmoid Volvulus - s/p emergent flexible sigmoidoscopy for decompression on 12/20 am. Patient initially with NG tube which was discontinued. Repeat abdominal film 05/25/13 with persistent sigmoid colonic volvulus, asymptomatic. Currently on liquid diet, tolerating that well. Volvulus reduced again 12/22 with colonic decompression tube placement. Abdominal films 12/23 with persistent/recurrent volvulus unfortunately and GI is to consider stenting,but recommended percutaneous endoscopic sigmoid colostomy.  Surgery is following, patient is a poor surgical candidate. Currently conservative management.  UTI - patient was on Ceftriaxone empirically, however urine culture speciated E coli resistant to Ceftriaxone and her antibiotics were switched to Ciprofloxacin on  12/23.  Hypokalemia - persistent, replete as needed, Mg 1.9.  Diastolic CHF - On exam patient appears to be compensated  Pulmonary hypertension - Stable  HTN - discontinue IV metoprolol since mild bradycardia. Resume Amlodipine and lisinopril given hypertension. Will likely not need Clonidine in the future and would ideally avoid this medication in her.  Atrial fibrillation -Currently in normal sinus rhythm  - Aspirin and Plavix held secondary to sigmoid volvulus  Diabetes type 2 - moderate SSI  Sacral decubitus -Consulted wound care to evaluate and treat  Dementia - mild Depression : stable.  Diet: full liquid, advance as tolerated.  Fluids: NS DVT Prophylaxis: Lovenox  Code Status: DNR Family Communication: none , discussed th eplan of care with the patient.  Disposition Plan: inpatient  Consultants:  Surgery  GI  Procedures:  Flex sig 12/20  Flex sig 12/22   Antibiotics Ceftriaxone 12/20 >> 12/23 Ciprofloxacin 12/23 >>  HPI/Subjective: Comofortable, denies any abd pain.  no BM yet.  Objective: Filed Vitals:   05/30/13 2008 05/31/13 0424 05/31/13 0500 05/31/13 1454  BP: 155/78 150/81  140/78  Pulse: 92 78  78  Temp: 98.2 F (36.8 C) 98.1 F (36.7 C)  98.1 F (36.7 C)  TempSrc: Oral Oral  Oral  Resp: 11 18  18   Height:      Weight:   76.1 kg (167 lb 12.3 oz)   SpO2: 98% 96%  97%    Intake/Output Summary (Last 24 hours) at 05/31/13 1549 Last data filed at 05/30/13 1700  Gross per 24 hour  Intake    120 ml  Output      0 ml  Net    120 ml   Filed Weights   05/29/13 0443 05/30/13 0609 05/31/13 0500  Weight: 75.8 kg (167 lb 1.7 oz) 76.6 kg (168 lb 14  oz) 76.1 kg (167 lb 12.3 oz)    Exam:  General:  NAD  Cardiovascular: iregular, without MRG  Respiratory: good air movement, clear to auscultation throughout, no wheezing, ronchi or rales  Abdomen: NTTP  MSK: no peripheral edema  Neuro: non focal  Data Reviewed: Basic Metabolic  Panel:  Recent Labs Lab 05/25/13 0656  05/26/13 0526 05/27/13 0521 05/27/13 1707 05/28/13 0538 05/29/13 0452 05/30/13 0853  NA 142  --  140 141  --  141 143 139  K 2.4*  < > 2.8* 2.3* 3.3* 3.0* 3.2* 3.3*  CL 111  --  106 110  --  112 114* 109  CO2 21  --  22 21  --  21 20 19   GLUCOSE 136*  --  110* 103*  --  118* 87 182*  BUN 8  --  10 7  --  6 7 6   CREATININE 0.69  --  0.71 0.63  --  0.66 0.66 0.67  CALCIUM 8.6  --  8.6 8.4  --  8.6 8.9 8.9  MG 1.7  --  2.3 1.9  --  1.7  --   --   < > = values in this interval not displayed. Liver Function Tests:  Recent Labs Lab 05/25/13 0656 05/26/13 0526 05/27/13 0521  AST 14 15 12   ALT 6 7 6   ALKPHOS 55 62 57  BILITOT 0.2* 0.4 0.4  PROT 5.8* 6.3 6.0  ALBUMIN 3.0* 3.2* 2.9*   CBC:  Recent Labs Lab 05/25/13 0656 05/26/13 0526  WBC 5.5 6.8  NEUTROABS 3.7 4.5  HGB 8.6* 9.5*  HCT 25.8* 28.3*  MCV 100.0 100.7*  PLT 104* 121*   BNP (last 3 results)  Recent Labs  05/24/13 0250  PROBNP 3915.0*   CBG:  Recent Labs Lab 05/30/13 2003 05/31/13 0009 05/31/13 0416 05/31/13 0816 05/31/13 1210  GLUCAP 162* 183* 102* 127* 178*    Recent Results (from the past 240 hour(s))  URINE CULTURE     Status: None   Collection Time    05/23/13  9:20 PM      Result Value Range Status   Specimen Description URINE, CATHETERIZED   Final   Special Requests NONE   Final   Culture  Setup Time     Final   Value: 05/24/2013 02:53     Performed at [facility] Count     Final   Value: >=100,000 COLONIES/ML     Performed at [facility]   Culture     Final   Value: ESCHERICHIA COLI     Note: Confirmed Extended Spectrum Beta-Lactamase Producer (ESBL) CRITICAL RESULT CALLED TO, READ BACK BY AND VERIFIED WITH: ALLY @10AM  12.23.14 BY BUONO     Performed at [facility]   Report Status 05/27/2013 FINAL   Final   Organism ID, Bacteria ESCHERICHIA COLI   Final  MRSA PCR SCREENING     Status: None    Collection Time    05/24/13  4:44 AM      Result Value Range Status   MRSA by PCR NEGATIVE  NEGATIVE Final   Comment:            The GeneXpert MRSA Assay (FDA     approved for NASAL specimens     only), is one component of a     comprehensive MRSA colonization     surveillance program. It is not     intended to diagnose MRSA  infection nor to guide or     monitor treatment for     MRSA infections.     Studies: No results found.  Scheduled Meds: . acetaminophen  650 mg Oral TID  . acidophilus  1 capsule Oral Daily  . amLODipine  5 mg Oral Daily  . aspirin EC  81 mg Oral Daily  . ciprofloxacin  400 mg Intravenous Q12H  . diclofenac sodium  2 g Topical QID  . donepezil  5 mg Oral QHS  . dorzolamide  1 drop Both Eyes TID  . feeding supplement (ENSURE COMPLETE)  237 mL Oral BID BM  . gabapentin  300 mg Oral BID  . insulin aspart  0-15 Units Subcutaneous Q4H  . ketotifen  1 drop Both Eyes BID  . latanoprost  1 drop Both Eyes QHS  . lip balm  1 application Topical BID  . lisinopril  20 mg Oral Daily  . loratadine  10 mg Oral Daily  . pantoprazole  40 mg Oral Daily  . sertraline  50 mg Oral QHS  . vitamin C  500 mg Oral Daily   Continuous Infusions:    Active Problems:   Atrial fibrillation with controlled ventricular response   Diastolic CHF, chronic   Parkinson disease   Hypertension   Hyperlipemia   Diabetes mellitus   Coronary artery disease   Thrombocytopenia   TIA (transient ischemic attack)   Dementia   Depression   Sigmoid volvulus   Pulmonary hypertension   Sacral decubitus ulcer   HOH (hard of hearing)   Pain in right shoulder joint   Volvulus of sigmoid colon  Time spent: 25 minutes Kathlen Mody, MD [facility] Pager [phone] If 7 PM - 7 AM, please contact night-coverage at [email or web address], password [email or web address] 05/31/2013, 3:49 PM  LOS: 8 days

## 2013-06-01 DIAGNOSIS — E876 Hypokalemia: Secondary | ICD-10-CM

## 2013-06-01 LAB — BASIC METABOLIC PANEL
BUN: 7 mg/dL (ref 6–23)
Calcium: 9 mg/dL (ref 8.4–10.5)
Chloride: 108 mEq/L (ref 96–112)
Creatinine, Ser: 0.64 mg/dL (ref 0.50–1.10)
GFR calc Af Amer: >90 mL/min (ref >90)
GFR calc non Af Amer: 79 mL/min — ABNORMAL LOW (ref >90)
Sodium: 141 mEq/L (ref 135–145)

## 2013-06-01 LAB — GLUCOSE, CAPILLARY
Glucose-Capillary: 140 mg/dL — ABNORMAL HIGH (ref 70–99)
Glucose-Capillary: 198 mg/dL — ABNORMAL HIGH (ref 70–99)
Glucose-Capillary: 170 mg/dL — ABNORMAL HIGH (ref 70–99)

## 2013-06-01 LAB — MAGNESIUM: Magnesium: 1.6 mg/dL (ref 1.5–2.5)

## 2013-06-01 MED ORDER — POTASSIUM CHLORIDE 10 MEQ/100ML IV SOLN
10.0000 meq | INTRAVENOUS | Status: AC
  Administered 2013-06-01 (×4): 10 meq via INTRAVENOUS
  Filled 2013-06-01 (×4): qty 100

## 2013-06-01 MED ORDER — POTASSIUM CHLORIDE 10 MEQ/100ML IV SOLN
10.0000 meq | INTRAVENOUS | Status: AC
  Administered 2013-06-01 – 2013-06-02 (×3): 10 meq via INTRAVENOUS
  Filled 2013-06-01 (×3): qty 100

## 2013-06-01 MED ORDER — MAGNESIUM SULFATE IN D5W 10-5 MG/ML-% IV SOLN
1.0000 g | Freq: Once | INTRAVENOUS | Status: AC
  Administered 2013-06-01: 14:00:00 1 g via INTRAVENOUS
  Filled 2013-06-01: qty 100

## 2013-06-01 MED ORDER — CIPROFLOXACIN HCL 500 MG PO TABS
500.0000 mg | ORAL_TABLET | Freq: Two times a day (BID) | ORAL | Status: DC
  Filled 2013-06-01 (×2): qty 1

## 2013-06-01 NOTE — Progress Notes (Signed)
PHARMACIST - PHYSICIAN COMMUNICATION DR:  Blake Divine CONCERNING: Antibiotic IV to Oral Route Change Policy  RECOMMENDATION: This patient is receiving Cipro by the intravenous route.  Based on criteria approved by the Pharmacy and Therapeutics Committee, the antibiotic(s) is/are being converted to the equivalent oral dose form(s).   DESCRIPTION: These criteria include:  Patient being treated for a respiratory tract infection, urinary tract infection, cellulitis or clostridium difficile associated diarrhea if on metronidazole  The patient is not neutropenic and does not exhibit a GI malabsorption state  The patient is eating (either orally or via tube) and/or has been taking other orally administered medications for a least 24 hours  The patient is improving clinically and has a Tmax < 100.5  If you have questions about this conversion, please contact the Pharmacy Department  []   [phone] )  [name] []   [phone] )  [facility]  []   [phone] )  [facility] [x]   [phone] )  [facility]   Thank you, Loralee Pacas, PharmD, BCPS 101-30-202014 11:32 AM

## 2013-06-01 NOTE — Evaluation (Signed)
Physical Therapy Evaluation Patient Details Name: Maria Christian MRN: [id number] DOB: 1925/06/04 Today's Date: 105/24/2014 Time: 0107-0130 PT Time Calculation (min): 23 min  PT Assessment / Plan / Recommendation History of Present Illness  Presented to ED with vomiting and nausea with her abdominal pain for the past 2 weeks per her daughter's. Per daughter patient uses wheelchair to ambulate around, and they are also concerned about a sacral decubitus which has not been addressed. They sent her here for concerns of a "blockage".   Clinical Impression  Pt requiring +2 total assist for SPT.  Pt states 1 person assisted her at her SNF.  Will follow pt acutely to work towards returning to her PLOF.  Recommend continued therapy when she returns to her SNF.    PT Assessment  Patient needs continued PT services    Follow Up Recommendations  SNF    Does the patient have the potential to tolerate intense rehabilitation      Barriers to Discharge        Equipment Recommendations  None recommended by PT    Recommendations for Other Services     Frequency Min 2X/week    Precautions / Restrictions Precautions Precautions: Fall   Pertinent Vitals/Pain No c/o pain.      Mobility  Bed Mobility Bed Mobility: Supine to Sit Supine to Sit: 1: +2 Total assist Supine to Sit: Patient Percentage: 40% Details for Bed Mobility Assistance: Pt helping get legs to edge of bed. Transfers Transfers: Editor, commissioning Transfers: 1: +2 Total assist Stand Pivot Transfers: Patient Percentage: 10% Details for Transfer Assistance: Pt with little effort/ability to move feet during SPT.  Recommended maximove to nursing.    Exercises     PT Diagnosis:    PT Problem List: Decreased activity tolerance;Decreased balance;Decreased mobility;Decreased strength PT Treatment Interventions: Functional mobility training;Therapeutic activities;Therapeutic exercise;Patient/family  education;Wheelchair mobility training     PT Goals(Current goals can be found in the care plan section) Acute Rehab PT Goals Patient Stated Goal: None stated.  PT Goal Formulation: With patient Time For Goal Achievement: 06/15/13 Potential to Achieve Goals: Good  Visit Information  Last PT Received On: 06/01/13 Assistance Needed: +2 History of Present Illness: Presented to ED with vomiting and nausea with her abdominal pain for the past 2 weeks per her daughter's. Per daughter patient uses wheelchair to ambulate around, and they are also concerned about a sacral decubitus which has not been addressed. They sent her here for concerns of a "blockage".        Prior Functioning  Home Living Family/patient expects to be discharged to:: Skilled nursing facility Prior Function Level of Independence: Needs assistance Gait / Transfers Assistance Needed: Transferred with 1 person A.  Propels w/c with B UE.  Is non-ambulatory. Communication Communication: HOH    Cognition  Cognition Arousal/Alertness: Awake/alert Behavior During Therapy: WFL for tasks assessed/performed Overall Cognitive Status: No family/caregiver present to determine baseline cognitive functioning    Extremity/Trunk Assessment Upper Extremity Assessment Upper Extremity Assessment: Generalized weakness Lower Extremity Assessment Lower Extremity Assessment: Generalized weakness Cervical / Trunk Assessment Cervical / Trunk Assessment: Normal   Balance    End of Session PT - End of Session Equipment Utilized During Treatment: Gait belt Activity Tolerance: Patient tolerated treatment well Patient left: in chair;with call bell/phone within reach Nurse Communication: Mobility status;Need for lift equipment  GP     Billyjoe Go LUBECK 105/24/2014, 4:18 PM

## 2013-06-01 NOTE — Progress Notes (Signed)
CRITICAL VALUE ALERT  Critical value received:  Potassium 2.5  Date of notification:  06/01/13  Time of notification: 0652  Critical value read back:yes  Nurse who received alert:  Radene Knee   MD notified (1st page):  Claiborne Billings  Time of first page:  0658  MD notified (2nd page): Blake Divine  Time of second page: 0705  Responding MD:  Blake Divine  Time MD responded:  [phone]

## 2013-06-01 NOTE — Progress Notes (Signed)
PROGRESS NOTE  Maria Christian ZOX:[id number] DOB: 08/23/1924 DOA: 05/23/2013 PCP: Karlene Einstein, MD  HPI Maria Christian is a 79 y.o. WF PMHx depression, dementia diastolic CHF, a fibrillation, CAD , HTN, HLD, diabetes type 2, Parkinson's disease,Hx TIA, thrombocytopenia, S/P cholecystectomy,S/P hysterectomy, S/P esophageal dilation. Presented to ED with vomiting and nausea with her abdominal pain for the past 2 weeks per her daughter's. Per daughter patient uses wheelchair to ambulate around, and they are also concerned about a sacral decubitus which has not been addressed. They sent her here for concerns of a "blockage". She is very hard of hearing so the her history is difficult. Her lungs are clear. She has a distended abdomen that is tympanic to percussion in all fields. Diffuse tenderness. Concern for bowel obstruction. AAS showed sigmoid volvulus vs obstruction. CT ordered.CT shows sigmoid volvulus. Surgery consulted, stated this is a GI consult. Dr. Rhea Belton with [facility] to see and plan for a unprepped Flexible Sigmoidoscopy. In addition Labs showed hypokalemia. IV potassium initiated. UTI found, rocephin given.   Assessment/Plan: Sigmoid Volvulus - s/p emergent flexible sigmoidoscopy for decompression on 12/20 am. Patient initially with NG tube which was discontinued. Repeat abdominal film 05/25/13 with persistent sigmoid colonic volvulus, asymptomatic. Currently on liquid diet, tolerating that well. Volvulus reduced again 12/22 with colonic decompression tube placement. Abdominal films 12/23 with persistent/recurrent volvulus unfortunately and GI is to consider stenting,but recommended percutaneous endoscopic sigmoid colostomy.  Surgery is following, patient is a poor surgical candidate. Currently conservative management.  UTI - patient was on Ceftriaxone empirically, however urine culture speciated E coli resistant to Ceftriaxone and her antibiotics were switched to Ciprofloxacin on  12/23. Completed 7 days of treatment.  Hypokalemia - persistent, replete as needed, Mg 1.6.  Diastolic CHF - On exam patient appears to be compensated  Pulmonary hypertension - Stable  HTN - discontinue IV metoprolol since mild bradycardia. Resume Amlodipine and lisinopril given hypertension. Will likely not need Clonidine in the future and would ideally avoid this medication in her.  Atrial fibrillation -Currently in normal sinus rhythm  - Aspirin and Plavix held secondary to sigmoid volvulus , which will be resumed now.  Diabetes type 2 - moderate SSI  Sacral decubitus -Consulted wound care to evaluate and treat  Dementia - mild Depression : stable.  Diet: soft advance as tolerated.  Fluids: NS DVT Prophylaxis: Lovenox  Code Status: DNR Family Communication: none , discussed th eplan of care with the patient.  Disposition Plan: inpatient  Consultants:  Surgery  GI  Procedures:  Flex sig 12/20  Flex sig 12/22   Antibiotics Ceftriaxone 12/20 >> 12/23 Ciprofloxacin 12/23 >> 12/27  HPI/Subjective: Comofortable, denies any abd pain.  Small BM Objective: Filed Vitals:   05/31/13 2300 06/01/13 0428 06/01/13 1010 06/01/13 1424  BP: 151/58 152/69 143/67 145/64  Pulse: 72 86  82  Temp: 97.7 F (36.5 C) 97.8 F (36.6 C)  97.5 F (36.4 C)  TempSrc: Oral Oral  Oral  Resp: 18 18  16   Height:      Weight:  78.5 kg (173 lb 1 oz)    SpO2: 98% 98%  99%    Intake/Output Summary (Last 24 hours) at 06/01/13 1816 Last data filed at 06/01/13 1430  Gross per 24 hour  Intake   1780 ml  Output      0 ml  Net   1780 ml   Filed Weights   05/30/13 0609 05/31/13 0500 06/01/13 0428  Weight: 76.6 kg (168 lb  14 oz) 76.1 kg (167 lb 12.3 oz) 78.5 kg (173 lb 1 oz)    Exam:  General:  NAD  Cardiovascular: iregular, without MRG  Respiratory: good air movement, clear to auscultation throughout, no wheezing, ronchi or rales  Abdomen: NTTP  MSK: no peripheral edema  Neuro:  non focal  Data Reviewed: Basic Metabolic Panel:  Recent Labs Lab 05/26/13 0526 05/27/13 0521 05/27/13 1707 05/28/13 0538 05/29/13 0452 05/30/13 0853 06/01/13 0505  NA 140 141  --  141 143 139 141  K 2.8* 2.3* 3.3* 3.0* 3.2* 3.3* 2.5*  CL 106 110  --  112 114* 109 108  CO2 22 21  --  21 20 19 23   GLUCOSE 110* 103*  --  118* 87 182* 144*  BUN 10 7  --  6 7 6 7   CREATININE 0.71 0.63  --  0.66 0.66 0.67 0.64  CALCIUM 8.6 8.4  --  8.6 8.9 8.9 9.0  MG 2.3 1.9  --  1.7  --   --  1.6   Liver Function Tests:  Recent Labs Lab 05/26/13 0526 05/27/13 0521  AST 15 12  ALT 7 6  ALKPHOS 62 57  BILITOT 0.4 0.4  PROT 6.3 6.0  ALBUMIN 3.2* 2.9*   CBC:  Recent Labs Lab 05/26/13 0526  WBC 6.8  NEUTROABS 4.5  HGB 9.5*  HCT 28.3*  MCV 100.7*  PLT 121*   BNP (last 3 results)  Recent Labs  05/24/13 0250  PROBNP 3915.0*   CBG:  Recent Labs Lab 05/31/13 1713 05/31/13 2228 05/31/13 2327 06/01/13 0426 06/01/13 1151  GLUCAP 130* 116* 118* 140* 198*    Recent Results (from the past 240 hour(s))  URINE CULTURE     Status: None   Collection Time    05/23/13  9:20 PM      Result Value Range Status   Specimen Description URINE, CATHETERIZED   Final   Special Requests NONE   Final   Culture  Setup Time     Final   Value: 05/24/2013 02:53     Performed at [facility] Count     Final   Value: >=100,000 COLONIES/ML     Performed at [facility]   Culture     Final   Value: ESCHERICHIA COLI     Note: Confirmed Extended Spectrum Beta-Lactamase Producer (ESBL) CRITICAL RESULT CALLED TO, READ BACK BY AND VERIFIED WITH: ALLY @10AM  12.23.14 BY BUONO     Performed at [facility]   Report Status 05/27/2013 FINAL   Final   Organism ID, Bacteria ESCHERICHIA COLI   Final  MRSA PCR SCREENING     Status: None   Collection Time    05/24/13  4:44 AM      Result Value Range Status   MRSA by PCR NEGATIVE  NEGATIVE Final   Comment:             The GeneXpert MRSA Assay (FDA     approved for NASAL specimens     only), is one component of a     comprehensive MRSA colonization     surveillance program. It is not     intended to diagnose MRSA     infection nor to guide or     monitor treatment for     MRSA infections.     Studies: No results found.  Scheduled Meds: . acetaminophen  650 mg Oral TID  . acidophilus  1 capsule  Oral Daily  . amLODipine  5 mg Oral Daily  . aspirin EC  81 mg Oral Daily  . ciprofloxacin  500 mg Oral BID  . diclofenac sodium  2 g Topical QID  . donepezil  5 mg Oral QHS  . dorzolamide  1 drop Both Eyes TID  . feeding supplement (ENSURE COMPLETE)  237 mL Oral BID BM  . gabapentin  300 mg Oral BID  . insulin aspart  0-15 Units Subcutaneous Q4H  . ketotifen  1 drop Both Eyes BID  . latanoprost  1 drop Both Eyes QHS  . lip balm  1 application Topical BID  . lisinopril  20 mg Oral Daily  . loratadine  10 mg Oral Daily  . pantoprazole  40 mg Oral Daily  . sertraline  50 mg Oral QHS  . vitamin C  500 mg Oral Daily   Continuous Infusions:    Active Problems:   Atrial fibrillation with controlled ventricular response   Diastolic CHF, chronic   Parkinson disease   Hypertension   Hyperlipemia   Diabetes mellitus   Coronary artery disease   Thrombocytopenia   TIA (transient ischemic attack)   Dementia   Depression   Sigmoid volvulus   Pulmonary hypertension   Sacral decubitus ulcer   HOH (hard of hearing)   Pain in right shoulder joint   Volvulus of sigmoid colon  Time spent: 25 minutes Kathlen Mody, MD [facility] Pager [phone] If 7 PM - 7 AM, please contact night-coverage at [email or web address], password [facility] [phone], 6:16 PM  LOS: 9 days

## 2013-06-02 DIAGNOSIS — K5900 Constipation, unspecified: Secondary | ICD-10-CM

## 2013-06-02 LAB — POTASSIUM: Potassium: 3.4 mEq/L — ABNORMAL LOW (ref 3.5–5.1)

## 2013-06-02 LAB — GLUCOSE, CAPILLARY
Glucose-Capillary: 120 mg/dL — ABNORMAL HIGH (ref 70–99)
Glucose-Capillary: 121 mg/dL — ABNORMAL HIGH (ref 70–99)
Glucose-Capillary: 127 mg/dL — ABNORMAL HIGH (ref 70–99)
Glucose-Capillary: 158 mg/dL — ABNORMAL HIGH (ref 70–99)

## 2013-06-02 LAB — BASIC METABOLIC PANEL
BUN: 6 mg/dL (ref 6–23)
CO2: 22 mEq/L (ref 19–32)
Calcium: 8.9 mg/dL (ref 8.4–10.5)
Creatinine, Ser: 0.66 mg/dL (ref 0.50–1.10)
GFR calc Af Amer: >90 mL/min (ref >90)
GFR calc non Af Amer: 78 mL/min — ABNORMAL LOW (ref >90)
Glucose, Bld: 142 mg/dL — ABNORMAL HIGH (ref 70–99)

## 2013-06-02 MED ORDER — POTASSIUM CHLORIDE CRYS ER 20 MEQ PO TBCR
40.0000 meq | EXTENDED_RELEASE_TABLET | ORAL | Status: DC
  Administered 2013-06-02 (×2): 40 meq via ORAL
  Filled 2013-06-02 (×4): qty 2

## 2013-06-02 MED ORDER — ENSURE COMPLETE PO LIQD: 237.0000 mL | Freq: Two times a day (BID) | ORAL | Status: DC

## 2013-06-02 MED ORDER — LISINOPRIL 20 MG PO TABS: 20.0000 mg | ORAL_TABLET | Freq: Every day | ORAL | Status: DC

## 2013-06-02 MED ORDER — POTASSIUM CHLORIDE CRYS ER 20 MEQ PO TBCR
40.0000 meq | EXTENDED_RELEASE_TABLET | Freq: Two times a day (BID) | ORAL | Status: DC
  Administered 2013-06-02: 40 meq via ORAL
  Filled 2013-06-02 (×2): qty 2

## 2013-06-02 MED ORDER — POLYETHYLENE GLYCOL 3350 17 G PO PACK
17.0000 g | PACK | Freq: Every day | ORAL | Status: DC
  Administered 2013-06-02: 17 g via ORAL
  Filled 2013-06-02: qty 1

## 2013-06-02 MED ORDER — POTASSIUM CHLORIDE 10 MEQ/100ML IV SOLN
10.0000 meq | INTRAVENOUS | Status: DC
  Filled 2013-06-02 (×2): qty 100

## 2013-06-02 NOTE — Progress Notes (Signed)
    Progress Note   Subjective   No complaints   Objective   Vital signs in last 24 hours: Temp:  [97.5 F (36.4 C)-98.3 F (36.8 C)] 98.3 F (36.8 C) (12/29 0451) Pulse Rate:  [70-95] 95 (12/29 0451) Resp:  [16-18] 18 (12/29 0451) BP: (143-148)/(64-72) 148/67 mmHg (12/29 0451) SpO2:  [97 %-99 %] 97 % (12/29 0451) Weight:  [172 lb 2.9 oz (78.1 kg)] 172 lb 2.9 oz (78.1 kg) (12/29 0451) Last BM Date: 05/31/13 General:    white female in NAD Heart:  Regular rate, irregular rhythm.  Lungs: Respirations even and unlabored Abdomen:  Soft, mildly distended, nontender. Overall bowel sounds hypoactive with a few "metalllic" bowel sounds Neurologic:  Alert ,  grossly normal neurologically.    BMET  Recent Labs  05/30/13 0853 06/01/13 0505 06/01/13 1920 06/02/13 0544  NA 139 141  --  142  K 3.3* 2.5* 2.8* 3.0*  CL 109 108  --  111  CO2 19 23  --  22  GLUCOSE 182* 144*  --  142*  BUN 6 7  --  6  CREATININE 0.67 0.64  --  0.66  CALCIUM 8.9 9.0  --  8.9      Assessment / Plan:   1. Persistent sigmoid volvulus, s/p flexible sigmoidoscopy with decompression x2. Patient poor surgical candidate. Clinically she is doing okay without obstructive symptoms. She is having BMs (large one last night) and tolerating diet. Will sign off. Call for questions.    LOS: 10 days   [name]  06/02/2013, 8:40 AM   [facility] Attending  I have also seen and assessed the patient and agree with the above note. Discussed situation with daughter. OK for dc from my standpoint. Would keep on daily MiraLax. Needs monitoring of bowel movements at SNF and additional laxatives/enemas prn - may help avoid recurrent volvulus.  Iva Boop, MD, [facility] [phone] (pager) 06/02/2013 2:37 PM

## 2013-06-02 NOTE — Progress Notes (Signed)
Spoke with [facility] and they stated that they are ready to receive pt.  Spoke with pt son [name] and explained discharge process with him and he stated understanding. Maria Christian A

## 2013-06-02 NOTE — Discharge Summary (Signed)
Physician Discharge Summary  Maria Christian ZOX:[id number] DOB: 03/09/1925 DOA: 05/23/2013  PCP: Karlene Einstein, MD  Admit date: 05/23/2013 Discharge date: 06/02/2013  Time spent: 30 minutes  Recommendations for Outpatient Follow-up:  1. Follow upwith PCP in one week 2. Please obtain abdominal films, if she has recurrent abdominal distension, she might have recurrent volvulus.  3. Monitor bowel movements at SNF with additional laxatives and prn enemas.   Discharge Diagnoses:  Active Problems:   Atrial fibrillation with controlled ventricular response   Diastolic CHF, chronic   Parkinson disease   Hypertension   Hyperlipemia   Diabetes mellitus   Coronary artery disease   Thrombocytopenia   TIA (transient ischemic attack)   Dementia   Depression   Sigmoid volvulus   Pulmonary hypertension   Sacral decubitus ulcer   HOH (hard of hearing)   Pain in right shoulder joint   Volvulus of sigmoid colon   Discharge Condition: improved.    Diet recommendation: regular, low sodium diet  Filed Weights   05/31/13 0500 06/01/13 0428 06/02/13 0451  Weight: 76.1 kg (167 lb 12.3 oz) 78.5 kg (173 lb 1 oz) 78.1 kg (172 lb 2.9 oz)    History of present illness:  Maria Christian is a 79 y.o. WF PMHx depression, dementia diastolic CHF, a fibrillation, CAD , HTN, HLD, diabetes type 2, Parkinson's disease,Hx TIA, thrombocytopenia, S/P cholecystectomy,S/P hysterectomy, S/P esophageal dilation. Presented to ED with vomiting and nausea with her abdominal pain for the past 2 weeks per her daughter's. Per daughter patient uses wheelchair to ambulate around, and they are also concerned about a sacral decubitus which has not been addressed. They sent her here for concerns of a "blockage". She is very hard of hearing so the her history is difficult. Her lungs are clear. She has a distended abdomen that is tympanic to percussion in all fields. Diffuse tenderness. Concern for bowel obstruction. AAS  showed sigmoid volvulus vs obstruction. CT ordered.CT shows sigmoid volvulus. Surgery consulted, stated this is a GI consult. Dr. Rhea Belton with [facility] to see and plan for a unprepped Flexible Sigmoidoscopy. In addition Labs showed hypokalemia. IV potassium initiated. UTI found, rocephin given.    Hospital Course:  Sigmoid Volvulus - s/p emergent flexible sigmoidoscopy for decompression on 12/20 am. Patient initially with NG tube which was discontinued. Repeat abdominal film 05/25/13 with persistent sigmoid colonic volvulus, asymptomatic.  Volvulus reduced again 12/22 with colonic decompression tube placement. Abdominal films 12/23 with persistent/recurrent volvulus unfortunately and GI is to consider stenting,but recommended percutaneous endoscopic sigmoid colostomy. Surgery is following, patient is a poor surgical candidate. Currently conservative management.  UTI - patient was on Ceftriaxone empirically, however urine culture speciated E coli resistant to Ceftriaxone and her antibiotics were switched to Ciprofloxacin on 12/23. Completed 7 days of treatment.  Hypokalemia - persistent, replete as needed, Mg 1.6. Repeat level ordered, if greater than 3 will discharge to SNF. Diastolic CHF - On exam patient appears to be compensated  Pulmonary hypertension - Stable  HTN - discontinue IV metoprolol since mild bradycardia. Resume Amlodipine and lisinopril given hypertension. Will likely not need Clonidine in the future and would ideally avoid this medication in her.  Atrial fibrillation -Currently in normal sinus rhythm  - Aspirin and Plavix held secondary to sigmoid volvulus , which will be resumed on discharge.  Diabetes type 2 - moderate SSI  Sacral decubitus -Consulted wound care to evaluate and treat  Dementia - mild  Depression : stable.   Procedures:  none  Consultations:  Gastroenterology  surgery  Discharge Exam: Filed Vitals:   06/02/13 1417  BP: 128/70  Pulse: 88  Temp: 98.4  F (36.9 C)  Resp: 18    General: NAD  Cardiovascular: iregular, without MRG  Respiratory: good air movement, clear to auscultation throughout, no wheezing, ronchi or rales  Abdomen: NTTP  MSK: no peripheral edema  Neuro: non focal  Discharge Instructions   Future Appointments Provider Department Dept Phone   09/11/2013 3:30 PM Ronal Fear, NP [facility] [phone]       Medication List    ASK your doctor about these medications       acetaminophen 325 MG tablet  Commonly known as:  TYLENOL  Take 650 mg by mouth 2 (two) times daily. For OA.     ALPRAZolam 0.25 MG tablet  Commonly known as:  XANAX  Take 0.25 mg by mouth 2 (two) times daily as needed for anxiety. Take 1 tablet by mouth daily at 2 pm for anxiety.     ALPRAZolam 0.25 MG tablet  Commonly known as:  XANAX  Take 0.25 mg by mouth at bedtime as needed for anxiety.     amLODipine 5 MG tablet  Commonly known as:  NORVASC  Take 5 mg by mouth daily.     aspirin 81 MG chewable tablet  Chew 81 mg by mouth daily.     cefTRIAXone 1 g/4 mLs in lidocaine (with preservative) injection  Inject 1 g into the muscle once.     CERTAGEN PO  Take 1 tablet by mouth daily.     cholecalciferol 1000 UNITS tablet  Commonly known as:  VITAMIN D  Take 1,000 Units by mouth daily.     cloNIDine 0.1 MG tablet  Commonly known as:  CATAPRES  Take 0.1 mg by mouth every 6 (six) hours as needed (SBP greater than 160).     clopidogrel 75 MG tablet  Commonly known as:  PLAVIX  Take 75 mg by mouth daily.     Cranberry 475 MG Caps  Take 1 capsule by mouth 2 (two) times daily.     dextromethorphan-guaiFENesin 30-600 MG per 12 hr tablet  Commonly known as:  MUCINEX DM  Take 1 tablet by mouth every 12 (twelve) hours.     donepezil 5 MG tablet  Commonly known as:  ARICEPT  Take 5 mg by mouth at bedtime.     dorzolamide 2 % ophthalmic solution  Commonly known as:  TRUSOPT  Place 1 drop into both eyes 3  (three) times daily.     fluconazole 100 MG tablet  Commonly known as:  DIFLUCAN  Take 100 mg by mouth daily. For 7 days.     furosemide 40 MG tablet  Commonly known as:  LASIX  Take 40 mg by mouth daily.     gabapentin 300 MG capsule  Commonly known as:  NEURONTIN  Take 300 mg by mouth 2 (two) times daily.     ibuprofen 400 MG tablet  Commonly known as:  ADVIL,MOTRIN  Take 400 mg by mouth daily at 12 noon.     ketotifen 0.025 % ophthalmic solution  Commonly known as:  ZADITOR  Place 1 drop into both eyes 2 (two) times daily.     lactulose 10 GM/15ML solution  Commonly known as:  CHRONULAC  Take 20 g by mouth 2 (two) times daily.     lidocaine 5 %  Commonly known as:  LIDODERM  Place 1 patch onto  the skin daily. Apply 1 patch to left lower back/hip every morning.  Remove & Discard patch within 12 hours or as directed by MD     lisinopril 40 MG tablet  Commonly known as:  PRINIVIL,ZESTRIL  Take 40 mg by mouth daily.     loratadine 10 MG tablet  Commonly known as:  CLARITIN  Take 10 mg by mouth daily.     magic mouthwash Soln  Take 10 mLs by mouth as needed for mouth pain.     magnesium citrate Soln  Take 0.5 Bottles by mouth 2 (two) times daily. For 1 day.     magnesium hydroxide 400 MG/5ML suspension  Commonly known as:  MILK OF MAGNESIA  Take 30 mLs by mouth daily as needed for mild constipation.     metFORMIN 1000 MG tablet  Commonly known as:  GLUCOPHAGE  Take 1,000 mg by mouth 2 (two) times daily with a meal.     metoCLOPramide 5 MG tablet  Commonly known as:  REGLAN  Take 2.5 mg by mouth 4 (four) times daily -  before meals and at bedtime.     ondansetron 4 MG tablet  Commonly known as:  ZOFRAN  Take 4 mg by mouth every 8 (eight) hours as needed for nausea or vomiting.     pantoprazole 40 MG tablet  Commonly known as:  PROTONIX  Take 40 mg by mouth 2 (two) times daily.     polyethylene glycol packet  Commonly known as:  MIRALAX / GLYCOLAX  Take  17 g by mouth daily.     potassium chloride SA 20 MEQ tablet  Commonly known as:  K-DUR,KLOR-CON  Take 20 mEq by mouth daily.     pravastatin 20 MG tablet  Commonly known as:  PRAVACHOL  Take 20 mg by mouth at bedtime.     promethazine 25 MG/ML injection  Commonly known as:  PHENERGAN  Inject 12.5 mg into the vein once.     sertraline 50 MG tablet  Commonly known as:  ZOLOFT  Take 50 mg by mouth at bedtime.     sodium chloride 0.9 % SOLN 1,000 mL with promethazine 25 MG/ML SOLN 25 mg  Inject 12.5 mg into the vein every 6 (six) hours as needed (nausea and vomiting).     SYSTANE OP  Place 1 drop into both eyes 4 (four) times daily.     Travoprost (BAK Free) 0.004 % Soln ophthalmic solution  Commonly known as:  TRAVATAN  Place 2 drops into both eyes at bedtime.     traZODone 50 MG tablet  Commonly known as:  DESYREL  Take 100 mg by mouth at bedtime.     vitamin B-12 500 MCG tablet  Commonly known as:  CYANOCOBALAMIN  Take 500 mcg by mouth daily.     vitamin C 500 MG tablet  Commonly known as:  ASCORBIC ACID  Take 500 mg by mouth daily.     VOLTAREN 1 % Gel  Generic drug:  diclofenac sodium  Apply 2 g topically 2 (two) times daily as needed (left neck pain).       Allergies  Allergen Reactions  . Aspirin Nausea And Vomiting      The results of significant diagnostics from this hospitalization (including imaging, microbiology, ancillary and laboratory) are listed below for reference.    Significant Diagnostic Studies: Dg Shoulder Right  05/25/2013   CLINICAL DATA:  Shoulder pain.  EXAM: RIGHT SHOULDER - 2+ VIEW  COMPARISON:  07/16/2011.  FINDINGS: Old healed slightly angulated right distal clavicular fracture is again noted. Prominent acromioclavicular degenerative change again noted. Glenohumeral degenerative change present. No acute abnormality identified. No evidence of acute fracture, dislocation, or separation.  IMPRESSION: 1. Old healed right clavicular  fracture. 2. Prominent degenerative changes about the right shoulder.   Electronically Signed   By: Maisie Fus  Register   On: 05/25/2013 14:30   Ct Abdomen Pelvis W Contrast  05/23/2013   CLINICAL DATA:  79 year old female with abdominal and pelvic pain. Nausea and vomiting.  EXAM: CT ABDOMEN AND PELVIS WITH CONTRAST  TECHNIQUE: Multidetector CT imaging of the abdomen and pelvis was performed using the standard protocol following bolus administration of intravenous contrast.  CONTRAST:  OMNIPAQUE IOHEXOL 300 MG/ML  SOLN  COMPARISON:  05/22/2011 CT and radiographs performed today.  FINDINGS: There is a volvulus of the mid-distal sigmoid colon causing a high-grade obstruction. There is a small amount of fluid within the rectum present.  There is no evidence of pneumoperitoneum. A trace amount of free pelvic fluid is noted.  Horseshoe kidney is again noted with nonobstructing left renal calculus.  Mild intrahepatic biliary fullness is identified.  The spleen and adrenal glands are unremarkable. A 1 x 2 cm hypodense lesion within the pancreatic body is unchanged. There is no evidence of distal pancreatic ductal dilatation.  There is no evidence of enlarged lymph nodes or abdominal aortic aneurysm.  No acute or suspicious bony abnormalities are identified. Left hip replacement is present.  Cardiomegaly and mild bibasilar atelectasis identified.  IMPRESSION: Mid-distal sigmoid colonic volvulus causing high-grade obstruction. No evidence of pneumoperitoneum.  Horseshoe kidney with nonobstructing left renal calculus.  Unchanged 1 x 2 cm pancreatic body lesion.  Cardiomegaly and mild bibasilar atelectasis.   Electronically Signed   By: Laveda Abbe M.D.   On: 05/23/2013 22:59   Dg Abd 2 Views  05/29/2013   CLINICAL DATA:  History of sigmoid volvulus  EXAM: ABDOMEN - 2 VIEW  COMPARISON:  May 27, 2013  FINDINGS: There remains a moderately distended loop of what is likely the sigmoid colon in the lower abdomen and  upper pelvis there is similar in configuration to that seen on the earlier study. The maximal measured diameter is approximately the 7 cm. There is a small amount of gas within small bowel loops and within loops of the descending colon. Again demonstrated is curvature of the thoracolumbar spine with convexity towards the right which may be positional. A drainage catheter or urinary bladder catheter has been withdrawn.  IMPRESSION: There has not been significant interval change in the appearance of the presumed sigmoid volvulus since the previous study.   Electronically Signed   By: David  [place]   On: 05/29/2013 09:14   Dg Abd 2 Views  05/27/2013   CLINICAL DATA:  Abdominal pain, evaluate for volvulus  EXAM: ABDOMEN - 2 VIEW  COMPARISON:  05/26/2013, 05/25/2013  FINDINGS: A rectal tube is been placed in the interim. There has been a component of decompression of the dilated loops of large bowel within the central to right lower quadrant. The configuration of the loops of bowel are unchanged. The sequela of sigmoid volvulus again cannot be excluded considering the orientation of the bowel. There is been partial decompression of remaining loops of large small bowel. Atherosclerotic calcifications identified within the lower thoracic and upper abdominal aorta. Dextroscoliosis identified within the lumbar spine. There is no evidence of free air.  IMPRESSION: The orientation of the loops of bowel within  the right lower quadrant is unchanged again raise concern of a volvulus. There has been a component of decompression of the loops of bowel status post rectal tube placement. There is no evidence of free air. Continued surveillance evaluation is recommended. Atherosclerotic calcifications within the abdominal aorta.   Electronically Signed   By: Salome Holmes M.D.   On: 05/27/2013 07:54   Dg Abd 2 Views  05/26/2013   CLINICAL DATA:  Possible sigmoid volvulus.  EXAM: ABDOMEN - 2 VIEW  COMPARISON:  May 25, 2013.  FINDINGS: There is a persistently distended gas-filled bowel loop in the mid and lower abdomen and upper pelvis similar to that seen previously. There is a moderate amount of gas in the ascending and transverse and descending portions of the colon. No gas is demonstrated within the rectum. No significant small bowel gas is demonstrated. No free extraluminal gas collections are demonstrated. The bony structures exhibit no acute abnormalities.  IMPRESSION: The findings are consistent with persistent sigmoid volvulus. There are no findings to suggest perforation currently.   Electronically Signed   By: David  [place]   On: 05/26/2013 12:59   Dg Abd 2 Views  05/25/2013   CLINICAL DATA:  Sigmoid volvulus. Decompressed 05/24/2013, question recurrence.  EXAM: ABDOMEN - 2 VIEW  COMPARISON:  CT 05/23/2013 .  FINDINGS: Sigmoid colon is distended, there appears to be a sigmoid colonic volvulus. There is mild proximal bowel distention. Surgical clips in the right upper quadrant. No free air is identified. The left hip replacement . These findings were discussed with nurse Marylene Land at 1:31 p.m. on 05/25/2013. The nurse was instructed to inform the patient's physician of these findings.  IMPRESSION: Findings consistent with sigmoid colonic volvulus.   Electronically Signed   By: Maisie Fus  Register   On: 05/25/2013 13:32   Dg Abd 2 Views  05/24/2013   CLINICAL DATA:  Abdominal pain, history of sigmoid volvulus.  EXAM: ABDOMEN - 2 VIEW, supine and left side down decubitus film  COMPARISON:  CT scan of the abdomen and pelvis dated May 23, 2013.  FINDINGS: There remains a loop of moderately distended gas-filled of bowel in the mid to lower abdomen and upper pelvis consistent with clinically suspected sigmoid volvulus. Proximal to this the colon does not appear abnormally distended. There is contrast within the urinary bladder.  The esophagogastric tube tip is at the level of the GE junction with the proximal port is  above the GE junction in the distal thoracic esophagus. Advancement by at least 20 cm is recommended. There are surgical clips in the gallbladder fossa. There is a prosthetic left hip joint.  IMPRESSION: 1. The esophagogastric tube position is inadequate and advancement by 20 cm is recommended to assure that the proximal port lies below the GE junction. 2. There remains mild gaseous distention of a curvilinear loop of bowel in the lower abdomen and upper pelvis consistent with the clinically suspected sigmoid volvulus. The degree of colonic distention with gas and stool appears somewhat less on today's abdominal series when compared to the earlier CT scan.   Electronically Signed   By: David  [place]   On: 05/24/2013 14:11   Dg Abd Acute W/chest  05/23/2013   CLINICAL DATA:  79 year old female with abdominal pain, distention and vomiting.  EXAM: ACUTE ABDOMEN SERIES (ABDOMEN 2 VIEW & CHEST 1 VIEW)  COMPARISON:  04/19/2013 and prior radiographs dating back to 05/22/2011. 05/22/2011 CT  FINDINGS: Cardiomegaly noted.  Pulmonary vascular congestion is identified.  Patchy  opacities/atelectasis within the left lung again noted.  Moderate to severe gaseous distention of the colon is noted. The sigmoid colon measures up to 13.6 cm. A sigmoid obstruction is not excluded although this bowel gas pattern has a somewhat similar appearance to 05/22/2011. .  There is no evidence of pneumoperitoneum.  Surgical clips within the right upper abdomen are noted.  A remote right clavicle fracture and left hip arthroplasty again identified.  IMPRESSION: Gaseous distention of the colon with the sigmoid colon measuring up to 13.6 cm. This could represent a sigmoid obstruction but has a somewhat similar appearance to 05/22/2011. CT may be helpful for further evaluation.  Cardiomegaly with pulmonary vascular congestion and chronic left lung opacities.   Electronically Signed   By: Laveda Abbe M.D.   On: 05/23/2013 21:22     Microbiology: Recent Results (from the past 240 hour(s))  URINE CULTURE     Status: None   Collection Time    05/23/13  9:20 PM      Result Value Range Status   Specimen Description URINE, CATHETERIZED   Final   Special Requests NONE   Final   Culture  Setup Time     Final   Value: 05/24/2013 02:53     Performed at [facility] Count     Final   Value: >=100,000 COLONIES/ML     Performed at [facility]   Culture     Final   Value: ESCHERICHIA COLI     Note: Confirmed Extended Spectrum Beta-Lactamase Producer (ESBL) CRITICAL RESULT CALLED TO, READ BACK BY AND VERIFIED WITH: ALLY @10AM  12.23.14 BY BUONO     Performed at [facility]   Report Status 05/27/2013 FINAL   Final   Organism ID, Bacteria ESCHERICHIA COLI   Final  MRSA PCR SCREENING     Status: None   Collection Time    05/24/13  4:44 AM      Result Value Range Status   MRSA by PCR NEGATIVE  NEGATIVE Final   Comment:            The GeneXpert MRSA Assay (FDA     approved for NASAL specimens     only), is one component of a     comprehensive MRSA colonization     surveillance program. It is not     intended to diagnose MRSA     infection nor to guide or     monitor treatment for     MRSA infections.     Labs: Basic Metabolic Panel:  Recent Labs Lab 05/27/13 0521  05/28/13 0538 05/29/13 0452 05/30/13 0853 06/01/13 0505 06/01/13 1920 06/02/13 0544  NA 141  --  141 143 139 141  --  142  K 2.3*  < > 3.0* 3.2* 3.3* 2.5* 2.8* 3.0*  CL 110  --  112 114* 109 108  --  111  CO2 21  --  21 20 19 23   --  22  GLUCOSE 103*  --  118* 87 182* 144*  --  142*  BUN 7  --  6 7 6 7   --  6  CREATININE 0.63  --  0.66 0.66 0.67 0.64  --  0.66  CALCIUM 8.4  --  8.6 8.9 8.9 9.0  --  8.9  MG 1.9  --  1.7  --   --  1.6  --  1.8  < > = values in this interval not displayed. Liver Function  Tests:  Recent Labs Lab 05/27/13 0521  AST 12  ALT 6  ALKPHOS 57  BILITOT 0.4  PROT 6.0  ALBUMIN  2.9*   No results found for this basename: LIPASE, AMYLASE,  in the last 168 hours No results found for this basename: AMMONIA,  in the last 168 hours CBC: No results found for this basename: WBC, NEUTROABS, HGB, HCT, MCV, PLT,  in the last 168 hours Cardiac Enzymes: No results found for this basename: CKTOTAL, CKMB, CKMBINDEX, TROPONINI,  in the last 168 hours BNP: BNP (last 3 results)  Recent Labs  05/24/13 0250  PROBNP 3915.0*   CBG:  Recent Labs Lab 06/01/13 1951 06/02/13 0058 06/02/13 0450 06/02/13 0752 06/02/13 1241  GLUCAP 132* 127* 121* 158* 120*       Signed:  Weaver Tweed  [facility] 06/02/2013, 2:47 PM

## 2013-06-02 NOTE — Progress Notes (Signed)
Patient discharged to [facility] via ambulance. Discharge packet prepared by CSW and given to EMS transporter for the the facility. Stage II ulcer on sacrum site clean dry and covered with allevyn dressing, no sign of infection noted.. Patient was admitted with wound.

## 2013-06-03 NOTE — Progress Notes (Signed)
Patient discharged last night by floor nurse due to waiting for lab result. Packet had been copied and placed in wall. DC summary had been sent to [facility]. Floor nurse to call ambulance and notify family.  Stokely Jeancharles C. Thursa Emme MSW, LCSW [phone]

## 2013-06-06 ENCOUNTER — Non-Acute Institutional Stay (SKILLED_NURSING_FACILITY): Payer: PRIVATE HEALTH INSURANCE | Admitting: Internal Medicine

## 2013-06-06 DIAGNOSIS — I4891 Unspecified atrial fibrillation: Secondary | ICD-10-CM

## 2013-06-06 DIAGNOSIS — I509 Heart failure, unspecified: Secondary | ICD-10-CM

## 2013-06-06 DIAGNOSIS — I5032 Chronic diastolic (congestive) heart failure: Secondary | ICD-10-CM

## 2013-06-06 DIAGNOSIS — E1059 Type 1 diabetes mellitus with other circulatory complications: Secondary | ICD-10-CM

## 2013-06-06 DIAGNOSIS — K562 Volvulus: Secondary | ICD-10-CM

## 2013-06-07 DIAGNOSIS — E1051 Type 1 diabetes mellitus with diabetic peripheral angiopathy without gangrene: Secondary | ICD-10-CM | POA: Insufficient documentation

## 2013-06-07 DIAGNOSIS — E1059 Type 1 diabetes mellitus with other circulatory complications: Secondary | ICD-10-CM | POA: Insufficient documentation

## 2013-06-07 NOTE — Progress Notes (Signed)
HISTORY & PHYSICAL  DATE: 06/06/2013   FACILITY: [facility]  LEVEL OF CARE: SNF (31)  ALLERGIES:  Allergies  Allergen Reactions  . Aspirin Nausea And Vomiting    CHIEF COMPLAINT:  Manage sigmoid volvulus, atrial fibrillation and CHF  HISTORY OF PRESENT ILLNESS: Patient is an 80 year old Caucasian female who was hospitalized secondary to nausea vomiting and abdominal pain. After hospitalization she is readmitted back to the facility for long-term care management.  SIGMOID VOLVULUS: Patient was diagnosed with a sigmoid volvulus per imaging studies. She is status post decompression by flexible sigmoidoscopy x3. She is a poor surgical candidate. X-rays showed persistent volvulus. Conservative management chosen at this time. Patient is a poor historian. Staff do not report further nausea vomiting or abdominal pain.  ATRIAL FIBRILLATION: the patients atrial fibrillation remains stable.  The staff deny DOE, tachycardia, orthopnea, transient neurological sx, palpitations, & PNDs.  No complications noted from the medications currently being used.  CHF:The staff does not relate significant weight changes, denies sob, DOE, orthopnea, PNDs, palpitations or chest pain.  CHF remains stable.  No complications form the medications being used.  PAST MEDICAL HISTORY :  Past Medical History  Diagnosis Date  . Atrial fibrillation   . Altered mental status   . Encephalopathy   . Parkinson disease   . Hypertension   . GERD (gastroesophageal reflux disease)   . Hyperlipemia   . Diabetes mellitus   . Coronary artery disease   . History of adenomatous polyp of colon 06/24/99  . Enlarged heart   . DEMENTIA   . Edema of lower extremity 07/13/11    right leg more swollen than left leg  . Esophageal dysmotility 07/02/12  . Horseshoe kidney   . Pancreatic lesion 05/22/11    no further workup  per PCP/family due to age  . Anemia   . Peripheral neuropathy   . Depression   .  Thrombophlebitis     PAST SURGICAL HISTORY: Past Surgical History  Procedure Laterality Date  . Shoulder surgery  2001    left clavicle excision and acromioplasty  . Abdominal hysterectomy    . Cholecystectomy    . Total hip arthroplasty    . Breast surgery      2 benign tumors removed left breast  . Foot surgery      benign tumors from foot  . Esophageal dilation      several times by Dr. [name]  . Nose surgery    . Esophagogastroduodenoscopy (egd) with esophageal dilation N/A 08/01/2012    Procedure: ESOPHAGOGASTRODUODENOSCOPY (EGD) WITH ESOPHAGEAL DILATION;  Surgeon: [name], MD;  Location: [facility];  Service: Endoscopy;  Laterality: N/A;  with c-arm savory dilators  . Flexible sigmoidoscopy N/A 05/24/2013    Procedure: FLEXIBLE SIGMOIDOSCOPY;  Surgeon: [name], MD;  Location: [facility];  Service: Endoscopy;  Laterality: N/A;  . Flexible sigmoidoscopy N/A 05/26/2013    Procedure:  flex with decompression of sigmoid volvulus;  Surgeon: [name], MD;  Location: [facility];  Service: Endoscopy;  Laterality: N/A;    SOCIAL HISTORY:  reports that she has quit smoking. She has never used smokeless tobacco. She reports that she does not drink alcohol or use illicit drugs.  FAMILY HISTORY:  Family History  Problem Relation Age of Onset  . Cancer    . Heart disease      CURRENT MEDICATIONS: Reviewed per [facility]  REVIEW OF SYSTEMS:  Unobtainable due to patient being a poor  historian  PHYSICAL EXAMINATION  VS:  T 98.2      P 78       RR 16       BP 127/73         GENERAL: no acute distress, normal body habitus EYES: conjunctivae normal, sclerae normal, normal eye lids MOUTH/THROAT: lips without lesions,no lesions in the mouth,tongue is without lesions,uvula elevates in midline NECK: supple, trachea midline, no neck masses, no thyroid tenderness, no thyromegaly LYMPHATICS: no LAN in the neck, no supraclavicular LAN RESPIRATORY: breathing is even &  unlabored, BS CTAB CARDIAC: Heart rate is irregularly irregular, no murmur,no extra heart sounds, right lower extremity has +2 edema GI:  ABDOMEN: abdomen soft, normal BS, no masses, no tenderness  LIVER/SPLEEN: no hepatomegaly, no splenomegaly MUSCULOSKELETAL: HEAD: normal to inspection & palpation BACK: no kyphosis, scoliosis or spinal processes tenderness EXTREMITIES: LEFT UPPER EXTREMITY: Moderate range of motion, normal strength  RIGHT UPPER EXTREMITY:  Moderate range of motion, normal strength  LEFT LOWER EXTREMITY:  Moderate range of motion, decreased strength RIGHT LOWER EXTREMITY:  Moderate range of motion, decreased strength in PSYCHIATRIC: the patient is alert & oriented to person, affect & behavior appropriate  LABS/RADIOLOGY:  Labs reviewed: Basic Metabolic Panel:  Recent Labs  05/28/13 0538  05/30/13 0853 06/01/13 0505 06/01/13 1920 06/02/13 0544 06/02/13 1555  NA 141  < > 139 141  --  142  --   K 3.0*  < > 3.3* 2.5* 2.8* 3.0* 3.4*  CL 112  < > 109 108  --  111  --   CO2 21  < > 19 23  --  22  --   GLUCOSE 118*  < > 182* 144*  --  142*  --   BUN 6  < > 6 7  --  6  --   CREATININE 0.66  < > 0.67 0.64  --  0.66  --   CALCIUM 8.6  < > 8.9 9.0  --  8.9  --   MG 1.7  --   --  1.6  --  1.8  --   < > = values in this interval not displayed. Liver Function Tests:  Recent Labs  05/25/13 0656 05/26/13 0526 05/27/13 0521  AST 14 15 12   ALT 6 7 6   ALKPHOS 55 62 57  BILITOT 0.2* 0.4 0.4  PROT 5.8* 6.3 6.0  ALBUMIN 3.0* 3.2* 2.9*    Recent Labs  04/19/13 1245  LIPASE 22   CBC:  Recent Labs  05/24/13 0518 05/25/13 0656 05/26/13 0526  WBC 6.2 5.5 6.8  NEUTROABS 4.3 3.7 4.5  HGB 8.7* 8.6* 9.5*  HCT 26.2* 25.8* 28.3*  MCV 100.0 100.0 100.7*  PLT 103* 104* 121*   and  CBG:  Recent Labs  06/02/13 0752 06/02/13 1241 06/02/13 1713  GLUCAP 158* 120* 137*   Urine cx: Escherichia coli  ACUTE ABDOMEN SERIES (ABDOMEN 2 VIEW & CHEST 1 VIEW)    COMPARISON:  04/19/2013 and prior radiographs dating back to 05/22/2011. 05/22/2011 CT   FINDINGS: Cardiomegaly noted.   Pulmonary vascular congestion is identified.   Patchy opacities/atelectasis within the left lung again noted.   Moderate to severe gaseous distention of the colon is noted. The sigmoid colon measures up to 13.6 cm. A sigmoid obstruction is not excluded although this bowel gas pattern has a somewhat similar appearance to 05/22/2011. .   There is no evidence of pneumoperitoneum.   Surgical clips within the right upper abdomen are  noted.   A remote right clavicle fracture and left hip arthroplasty again identified.   IMPRESSION: Gaseous distention of the colon with the sigmoid colon measuring up to 13.6 cm. This could represent a sigmoid obstruction but has a somewhat similar appearance to 05/22/2011. CT may be helpful for further evaluation.   Cardiomegaly with pulmonary vascular congestion and chronic left lung opacities.   CT ABDOMEN AND PELVIS WITH CONTRAST   TECHNIQUE: Multidetector CT imaging of the abdomen and pelvis was performed using the standard protocol following bolus administration of intravenous contrast.   CONTRAST:  151mL OMNIPAQUE IOHEXOL 300 MG/ML  SOLN   COMPARISON:  05/22/2011 CT and radiographs performed today.   FINDINGS: There is a volvulus of the mid-distal sigmoid colon causing a high-grade obstruction. There is a small amount of fluid within the rectum present.   There is no evidence of pneumoperitoneum. A trace amount of free pelvic fluid is noted.   Horseshoe kidney is again noted with nonobstructing left renal calculus.   Mild intrahepatic biliary fullness is identified.   The spleen and adrenal glands are unremarkable. A 1 x 2 cm hypodense lesion within the pancreatic body is unchanged. There is no evidence of distal pancreatic ductal dilatation.   There is no evidence of enlarged lymph nodes or abdominal  aortic aneurysm.   No acute or suspicious bony abnormalities are identified. Left hip replacement is present.   Cardiomegaly and mild bibasilar atelectasis identified.   IMPRESSION: Mid-distal sigmoid colonic volvulus causing high-grade obstruction. No evidence of pneumoperitoneum.   Horseshoe kidney with nonobstructing left renal calculus.   Unchanged 1 x 2 cm pancreatic body lesion.   Cardiomegaly and mild bibasilar atelectasis. RIGHT SHOULDER - 2+ VIEW   COMPARISON:  07/16/2011.   FINDINGS: Old healed slightly angulated right distal clavicular fracture is again noted. Prominent acromioclavicular degenerative change again noted. Glenohumeral degenerative change present. No acute abnormality identified. No evidence of acute fracture, dislocation, or separation.   IMPRESSION: 1. Old healed right clavicular fracture. 2. Prominent degenerative changes about the right shoulder. ABDOMEN - 2 VIEW   COMPARISON:  May 27, 2013   FINDINGS: There remains a moderately distended loop of what is likely the sigmoid colon in the lower abdomen and upper pelvis there is similar in configuration to that seen on the earlier study. The maximal measured diameter is approximately the 7 cm. There is a small amount of gas within small bowel loops and within loops of the descending colon. Again demonstrated is curvature of the thoracolumbar spine with convexity towards the right which may be positional. A drainage catheter or urinary bladder catheter has been withdrawn.   IMPRESSION: There has not been significant interval change in the appearance of the presumed sigmoid volvulus since the previous study.    ASSESSMENT/PLAN:  Sigmoid volvulus-conservative management Atrial fibrillation-rate controlled CHF well compensated Diabetes mellitus with vascular complications-continue current medications Pulmonary hypertension-stable CAD-stable Parkinson's disease-continue current  medications  Hypertension-well controlled Check BMP  I have discussed management with optum he practitioner  I have reviewed patient's medical records received at admission/from hospitalization.  CPT CODE: 80998

## 2013-06-17 NOTE — Progress Notes (Signed)
This encounter was created in error - please disregard.

## 2013-07-02 DIAGNOSIS — M79609 Pain in unspecified limb: Secondary | ICD-10-CM | POA: Diagnosis not present

## 2013-07-02 DIAGNOSIS — B351 Tinea unguium: Secondary | ICD-10-CM | POA: Diagnosis not present

## 2013-07-11 NOTE — Progress Notes (Signed)
This encounter was created in error - please disregard.

## 2013-07-24 ENCOUNTER — Encounter (HOSPITAL_COMMUNITY): Payer: Self-pay | Admitting: Emergency Medicine

## 2013-07-24 ENCOUNTER — Inpatient Hospital Stay (HOSPITAL_COMMUNITY)
Admission: EM | Admit: 2013-07-24 | Discharge: 2013-07-28 | DRG: 690 | Disposition: A | Discharge status: Skilled Nursing Facility | Payer: PRIVATE HEALTH INSURANCE | Attending: Internal Medicine | Admitting: Internal Medicine

## 2013-07-24 ENCOUNTER — Emergency Department (HOSPITAL_COMMUNITY): Payer: PRIVATE HEALTH INSURANCE

## 2013-07-24 ENCOUNTER — Inpatient Hospital Stay (HOSPITAL_COMMUNITY): Payer: PRIVATE HEALTH INSURANCE

## 2013-07-24 DIAGNOSIS — E119 Type 2 diabetes mellitus without complications: Secondary | ICD-10-CM

## 2013-07-24 DIAGNOSIS — Z96649 Presence of unspecified artificial hip joint: Secondary | ICD-10-CM

## 2013-07-24 DIAGNOSIS — F03918 Unspecified dementia, unspecified severity, with other behavioral disturbance: Secondary | ICD-10-CM | POA: Diagnosis present

## 2013-07-24 DIAGNOSIS — F32A Depression, unspecified: Secondary | ICD-10-CM

## 2013-07-24 DIAGNOSIS — K5900 Constipation, unspecified: Secondary | ICD-10-CM

## 2013-07-24 DIAGNOSIS — L89159 Pressure ulcer of sacral region, unspecified stage: Secondary | ICD-10-CM

## 2013-07-24 DIAGNOSIS — M25519 Pain in unspecified shoulder: Secondary | ICD-10-CM

## 2013-07-24 DIAGNOSIS — R627 Adult failure to thrive: Secondary | ICD-10-CM | POA: Diagnosis present

## 2013-07-24 DIAGNOSIS — R1110 Vomiting, unspecified: Secondary | ICD-10-CM

## 2013-07-24 DIAGNOSIS — R634 Abnormal weight loss: Secondary | ICD-10-CM | POA: Diagnosis present

## 2013-07-24 DIAGNOSIS — B9620 Unspecified Escherichia coli [E. coli] as the cause of diseases classified elsewhere: Secondary | ICD-10-CM | POA: Diagnosis present

## 2013-07-24 DIAGNOSIS — R404 Transient alteration of awareness: Secondary | ICD-10-CM | POA: Diagnosis present

## 2013-07-24 DIAGNOSIS — A498 Other bacterial infections of unspecified site: Secondary | ICD-10-CM | POA: Diagnosis present

## 2013-07-24 DIAGNOSIS — K219 Gastro-esophageal reflux disease without esophagitis: Secondary | ICD-10-CM

## 2013-07-24 DIAGNOSIS — K562 Volvulus: Secondary | ICD-10-CM

## 2013-07-24 DIAGNOSIS — D696 Thrombocytopenia, unspecified: Secondary | ICD-10-CM

## 2013-07-24 DIAGNOSIS — F0391 Unspecified dementia with behavioral disturbance: Secondary | ICD-10-CM | POA: Diagnosis present

## 2013-07-24 DIAGNOSIS — Z6827 Body mass index (BMI) 27.0-27.9, adult: Secondary | ICD-10-CM

## 2013-07-24 DIAGNOSIS — R531 Weakness: Secondary | ICD-10-CM

## 2013-07-24 DIAGNOSIS — Z66 Do not resuscitate: Secondary | ICD-10-CM | POA: Diagnosis present

## 2013-07-24 DIAGNOSIS — Z79899 Other long term (current) drug therapy: Secondary | ICD-10-CM

## 2013-07-24 DIAGNOSIS — Z86010 Personal history of colonic polyps: Secondary | ICD-10-CM

## 2013-07-24 DIAGNOSIS — K8689 Other specified diseases of pancreas: Secondary | ICD-10-CM

## 2013-07-24 DIAGNOSIS — N390 Urinary tract infection, site not specified: Principal | ICD-10-CM

## 2013-07-24 DIAGNOSIS — G20A1 Parkinson's disease without dyskinesia, without mention of fluctuations: Secondary | ICD-10-CM

## 2013-07-24 DIAGNOSIS — I369 Nonrheumatic tricuspid valve disorder, unspecified: Secondary | ICD-10-CM

## 2013-07-24 DIAGNOSIS — Z87891 Personal history of nicotine dependence: Secondary | ICD-10-CM

## 2013-07-24 DIAGNOSIS — I5032 Chronic diastolic (congestive) heart failure: Secondary | ICD-10-CM

## 2013-07-24 DIAGNOSIS — I509 Heart failure, unspecified: Secondary | ICD-10-CM | POA: Diagnosis present

## 2013-07-24 DIAGNOSIS — L8991 Pressure ulcer of unspecified site, stage 1: Secondary | ICD-10-CM | POA: Diagnosis present

## 2013-07-24 DIAGNOSIS — Z515 Encounter for palliative care: Secondary | ICD-10-CM

## 2013-07-24 DIAGNOSIS — E860 Dehydration: Secondary | ICD-10-CM

## 2013-07-24 DIAGNOSIS — F3289 Other specified depressive episodes: Secondary | ICD-10-CM | POA: Diagnosis present

## 2013-07-24 DIAGNOSIS — G459 Transient cerebral ischemic attack, unspecified: Secondary | ICD-10-CM

## 2013-07-24 DIAGNOSIS — N289 Disorder of kidney and ureter, unspecified: Secondary | ICD-10-CM

## 2013-07-24 DIAGNOSIS — G4700 Insomnia, unspecified: Secondary | ICD-10-CM

## 2013-07-24 DIAGNOSIS — Z7401 Bed confinement status: Secondary | ICD-10-CM

## 2013-07-24 DIAGNOSIS — Z886 Allergy status to analgesic agent status: Secondary | ICD-10-CM

## 2013-07-24 DIAGNOSIS — F0390 Unspecified dementia without behavioral disturbance: Secondary | ICD-10-CM

## 2013-07-24 DIAGNOSIS — N179 Acute kidney failure, unspecified: Secondary | ICD-10-CM

## 2013-07-24 DIAGNOSIS — Z860101 Personal history of adenomatous and serrated colon polyps: Secondary | ICD-10-CM

## 2013-07-24 DIAGNOSIS — F329 Major depressive disorder, single episode, unspecified: Secondary | ICD-10-CM

## 2013-07-24 DIAGNOSIS — I4891 Unspecified atrial fibrillation: Secondary | ICD-10-CM

## 2013-07-24 DIAGNOSIS — L89109 Pressure ulcer of unspecified part of back, unspecified stage: Secondary | ICD-10-CM | POA: Diagnosis present

## 2013-07-24 DIAGNOSIS — I959 Hypotension, unspecified: Secondary | ICD-10-CM

## 2013-07-24 DIAGNOSIS — I2510 Atherosclerotic heart disease of native coronary artery without angina pectoris: Secondary | ICD-10-CM

## 2013-07-24 DIAGNOSIS — E785 Hyperlipidemia, unspecified: Secondary | ICD-10-CM

## 2013-07-24 DIAGNOSIS — I10 Essential (primary) hypertension: Secondary | ICD-10-CM

## 2013-07-24 DIAGNOSIS — I272 Pulmonary hypertension, unspecified: Secondary | ICD-10-CM

## 2013-07-24 DIAGNOSIS — Z0000 Encounter for general adult medical examination without abnormal findings: Secondary | ICD-10-CM

## 2013-07-24 DIAGNOSIS — G20 Parkinson's disease: Secondary | ICD-10-CM | POA: Diagnosis present

## 2013-07-24 DIAGNOSIS — H9190 Unspecified hearing loss, unspecified ear: Secondary | ICD-10-CM

## 2013-07-24 LAB — CBC WITH DIFFERENTIAL/PLATELET
BASOS ABS: 0 10*3/uL (ref 0.0–0.1)
BASOS ABS: 0 10*3/uL (ref 0.0–0.1)
Basophils Relative: 0 % (ref 0–1)
Basophils Relative: 0 % (ref 0–1)
EOS ABS: 0.1 10*3/uL (ref 0.0–0.7)
EOS PCT: 4 % (ref 0–5)
Eosinophils Absolute: 0.2 10*3/uL (ref 0.0–0.7)
Eosinophils Relative: 1 % (ref 0–5)
HCT: 27.9 % — ABNORMAL LOW (ref 36.0–46.0)
HEMATOCRIT: 27 % — AB (ref 36.0–46.0)
HEMOGLOBIN: 8.9 g/dL — AB (ref 12.0–15.0)
Hemoglobin: 9 g/dL — ABNORMAL LOW (ref 12.0–15.0)
LYMPHS ABS: 1.2 10*3/uL (ref 0.7–4.0)
LYMPHS PCT: 23 % (ref 12–46)
Lymphocytes Relative: 12 % (ref 12–46)
Lymphs Abs: 0.9 10*3/uL (ref 0.7–4.0)
MCH: 33.7 pg (ref 26.0–34.0)
MCH: 34.4 pg — ABNORMAL HIGH (ref 26.0–34.0)
MCHC: 32.3 g/dL (ref 30.0–36.0)
MCHC: 33 g/dL (ref 30.0–36.0)
MCV: 104.5 fL — ABNORMAL HIGH (ref 78.0–100.0)
MCV: 104.2 fL — AB (ref 78.0–100.0)
MONOS PCT: 10 % (ref 3–12)
Monocytes Absolute: 0.6 10*3/uL (ref 0.1–1.0)
Monocytes Absolute: 0.5 10*3/uL (ref 0.1–1.0)
Monocytes Relative: 8 % (ref 3–12)
NEUTROS ABS: 6 10*3/uL (ref 1.7–7.7)
NEUTROS ABS: 3.1 10*3/uL (ref 1.7–7.7)
NEUTROS PCT: 79 % — AB (ref 43–77)
Neutrophils Relative %: 63 % (ref 43–77)
Platelets: 118 10*3/uL — ABNORMAL LOW (ref 150–400)
Platelets: 111 10*3/uL — ABNORMAL LOW (ref 150–400)
RBC: 2.67 MIL/uL — ABNORMAL LOW (ref 3.87–5.11)
RBC: 2.59 MIL/uL — ABNORMAL LOW (ref 3.87–5.11)
RDW: 14.7 % (ref 11.5–15.5)
RDW: 15 % (ref 11.5–15.5)
WBC: 7.6 10*3/uL (ref 4.0–10.5)
WBC: 5 10*3/uL (ref 4.0–10.5)

## 2013-07-24 LAB — COMPREHENSIVE METABOLIC PANEL
ALBUMIN: 3 g/dL — AB (ref 3.5–5.2)
ALT: 8 U/L (ref 0–35)
ALT: 37 U/L — ABNORMAL HIGH (ref 0–35)
AST: 14 U/L (ref 0–37)
AST: 45 U/L — AB (ref 0–37)
Albumin: 3.6 g/dL (ref 3.5–5.2)
Alkaline Phosphatase: 75 U/L (ref 39–117)
Alkaline Phosphatase: 61 U/L (ref 39–117)
BUN: 40 mg/dL — AB (ref 6–23)
BUN: 33 mg/dL — ABNORMAL HIGH (ref 6–23)
CALCIUM: 9.3 mg/dL (ref 8.4–10.5)
CHLORIDE: 114 meq/L — AB (ref 96–112)
CO2: 19 mEq/L (ref 19–32)
CO2: 16 mEq/L — ABNORMAL LOW (ref 19–32)
CREATININE: 1.62 mg/dL — AB (ref 0.50–1.10)
Calcium: 8.1 mg/dL — ABNORMAL LOW (ref 8.4–10.5)
Chloride: 103 mEq/L (ref 96–112)
Creatinine, Ser: 1.97 mg/dL — ABNORMAL HIGH (ref 0.50–1.10)
GFR calc Af Amer: 32 mL/min — ABNORMAL LOW (ref >90)
GFR calc non Af Amer: 22 mL/min — ABNORMAL LOW (ref >90)
GFR calc non Af Amer: 28 mL/min — ABNORMAL LOW (ref >90)
GFR, EST AFRICAN AMERICAN: 25 mL/min — AB (ref >90)
Glucose, Bld: 164 mg/dL — ABNORMAL HIGH (ref 70–99)
Glucose, Bld: 106 mg/dL — ABNORMAL HIGH (ref 70–99)
POTASSIUM: 4.6 meq/L (ref 3.7–5.3)
POTASSIUM: 4.8 meq/L (ref 3.7–5.3)
Sodium: 138 mEq/L (ref 137–147)
Sodium: 143 mEq/L (ref 137–147)
TOTAL PROTEIN: 6.8 g/dL (ref 6.0–8.3)
TOTAL PROTEIN: 6 g/dL (ref 6.0–8.3)
Total Bilirubin: <0.2 mg/dL — ABNORMAL LOW (ref 0.3–1.2)
Total Bilirubin: <0.2 mg/dL — ABNORMAL LOW (ref 0.3–1.2)

## 2013-07-24 LAB — TYPE AND SCREEN
ABO/RH(D): O POS
ANTIBODY SCREEN: NEGATIVE

## 2013-07-24 LAB — GLUCOSE, CAPILLARY
GLUCOSE-CAPILLARY: 101 mg/dL — AB (ref 70–99)
Glucose-Capillary: 114 mg/dL — ABNORMAL HIGH (ref 70–99)
Glucose-Capillary: 93 mg/dL (ref 70–99)

## 2013-07-24 LAB — URINE MICROSCOPIC-ADD ON

## 2013-07-24 LAB — CORTISOL: CORTISOL PLASMA: 4.2 ug/dL

## 2013-07-24 LAB — URINALYSIS, ROUTINE W REFLEX MICROSCOPIC
BILIRUBIN URINE: NEGATIVE
GLUCOSE, UA: NEGATIVE mg/dL
KETONES UR: NEGATIVE mg/dL
Nitrite: NEGATIVE
PH: 5 (ref 5.0–8.0)
Protein, ur: 100 mg/dL — AB
SPECIFIC GRAVITY, URINE: 1.017 (ref 1.005–1.030)
Urobilinogen, UA: 0.2 mg/dL (ref 0.0–1.0)

## 2013-07-24 LAB — ABO/RH: ABO/RH(D): O POS

## 2013-07-24 LAB — CG4 I-STAT (LACTIC ACID): LACTIC ACID, VENOUS: 2.06 mmol/L (ref 0.5–2.2)

## 2013-07-24 LAB — LACTIC ACID, PLASMA: Lactic Acid, Venous: 1 mmol/L (ref 0.5–2.2)

## 2013-07-24 LAB — TROPONIN I: Troponin I: <0.3 ng/mL (ref <0.30)

## 2013-07-24 LAB — MRSA PCR SCREENING: MRSA BY PCR: NEGATIVE

## 2013-07-24 LAB — POC OCCULT BLOOD, ED: Fecal Occult Bld: NEGATIVE

## 2013-07-24 LAB — LIPASE, BLOOD: LIPASE: 31 U/L (ref 11–59)

## 2013-07-24 MED ORDER — PIPERACILLIN-TAZOBACTAM 3.375 G IVPB 30 MIN: 3.3750 g | Freq: Once | INTRAVENOUS | Status: DC

## 2013-07-24 MED ORDER — SODIUM CHLORIDE 0.9 % IJ SOLN
3.0000 mL | Freq: Two times a day (BID) | INTRAMUSCULAR | Status: DC
  Administered 2013-07-25 – 2013-07-28 (×3): 3 mL via INTRAVENOUS

## 2013-07-24 MED ORDER — LATANOPROST 0.005 % OP SOLN
1.0000 [drp] | Freq: Every day | OPHTHALMIC | Status: DC
  Administered 2013-07-24 – 2013-07-27 (×4): 1 [drp] via OPHTHALMIC
  Filled 2013-07-24 (×3): qty 2.5

## 2013-07-24 MED ORDER — ONDANSETRON HCL 4 MG PO TABS: 4.0000 mg | ORAL_TABLET | Freq: Four times a day (QID) | ORAL | Status: DC | PRN

## 2013-07-24 MED ORDER — IOHEXOL 300 MG/ML  SOLN
25.0000 mL | INTRAMUSCULAR | Status: AC
Start: 2013-07-24 — End: 2013-07-24
  Administered 2013-07-24: 25 mL via ORAL

## 2013-07-24 MED ORDER — ACETAMINOPHEN 325 MG PO TABS
650.0000 mg | ORAL_TABLET | Freq: Four times a day (QID) | ORAL | Status: DC | PRN
  Administered 2013-07-24: 650 mg via ORAL
  Filled 2013-07-24: qty 2

## 2013-07-24 MED ORDER — ONDANSETRON HCL 4 MG/2ML IJ SOLN
4.0000 mg | Freq: Four times a day (QID) | INTRAMUSCULAR | Status: DC | PRN
  Administered 2013-07-26: 4 mg via INTRAVENOUS
  Filled 2013-07-24: qty 2

## 2013-07-24 MED ORDER — INSULIN ASPART 100 UNIT/ML ~~LOC~~ SOLN
0.0000 [IU] | Freq: Three times a day (TID) | SUBCUTANEOUS | Status: DC
  Administered 2013-07-25: 1 [IU] via SUBCUTANEOUS
  Administered 2013-07-25 – 2013-07-26 (×3): 2 [IU] via SUBCUTANEOUS
  Administered 2013-07-26 – 2013-07-27 (×4): 1 [IU] via SUBCUTANEOUS
  Administered 2013-07-28: 3 [IU] via SUBCUTANEOUS
  Administered 2013-07-28: 2 [IU] via SUBCUTANEOUS

## 2013-07-24 MED ORDER — VANCOMYCIN HCL IN DEXTROSE 1-5 GM/200ML-% IV SOLN: 1000.0000 mg | Freq: Once | INTRAVENOUS | Status: DC

## 2013-07-24 MED ORDER — SODIUM CHLORIDE 0.9 % IV SOLN
3.0000 g | Freq: Two times a day (BID) | INTRAVENOUS | Status: DC
  Administered 2013-07-24 – 2013-07-25 (×2): 3 g via INTRAVENOUS
  Filled 2013-07-24 (×5): qty 3

## 2013-07-24 MED ORDER — PIPERACILLIN-TAZOBACTAM 3.375 G IVPB
3.3750 g | Freq: Three times a day (TID) | INTRAVENOUS | Status: DC
  Filled 2013-07-24 (×2): qty 50

## 2013-07-24 MED ORDER — ACETAMINOPHEN 325 MG PO TABS
650.0000 mg | ORAL_TABLET | Freq: Four times a day (QID) | ORAL | Status: DC | PRN
  Administered 2013-07-26: 650 mg via ORAL
  Filled 2013-07-24 (×2): qty 2

## 2013-07-24 MED ORDER — SODIUM CHLORIDE 0.9 % IV BOLUS (SEPSIS)
1000.0000 mL | Freq: Once | INTRAVENOUS | Status: AC
  Administered 2013-07-24: 1000 mL via INTRAVENOUS

## 2013-07-24 MED ORDER — SODIUM CHLORIDE 0.9 % IV SOLN
3.0000 g | INTRAVENOUS | Status: AC
  Administered 2013-07-24: 3 g via INTRAVENOUS
  Filled 2013-07-24: qty 3

## 2013-07-24 MED ORDER — SODIUM CHLORIDE 0.9 % IV SOLN
INTRAVENOUS | Status: AC
Start: 2013-07-24 — End: 2013-07-25
  Administered 2013-07-24 – 2013-07-25 (×2): via INTRAVENOUS

## 2013-07-24 MED ORDER — VANCOMYCIN HCL IN DEXTROSE 750-5 MG/150ML-% IV SOLN: 750.0000 mg | INTRAVENOUS | Status: DC

## 2013-07-24 MED ORDER — SODIUM CHLORIDE 0.9 % IV BOLUS (SEPSIS)
500.0000 mL | Freq: Once | INTRAVENOUS | Status: AC
  Administered 2013-07-24: 500 mL via INTRAVENOUS

## 2013-07-24 MED ORDER — CIPROFLOXACIN IN D5W 400 MG/200ML IV SOLN
400.0000 mg | Freq: Once | INTRAVENOUS | Status: AC
  Administered 2013-07-24: 400 mg via INTRAVENOUS
  Filled 2013-07-24: qty 200

## 2013-07-24 MED ORDER — COLLAGENASE 250 UNIT/GM EX OINT
TOPICAL_OINTMENT | Freq: Every day | CUTANEOUS | Status: DC
  Administered 2013-07-24 – 2013-07-28 (×5): via TOPICAL
  Filled 2013-07-24 (×2): qty 30

## 2013-07-24 MED ORDER — ACETAMINOPHEN 650 MG RE SUPP: 650.0000 mg | Freq: Four times a day (QID) | RECTAL | Status: DC | PRN

## 2013-07-24 NOTE — ED Notes (Signed)
Bed: NP00 Expected date:  Expected time:  Means of arrival:  Comments: EMS/89 from SNF with weakness, lethargy and arm pain

## 2013-07-24 NOTE — Progress Notes (Signed)
ANTIBIOTIC CONSULT NOTE - INITIAL  Pharmacy Consult for Unasyn Indication: Intra-abdominal infection  Allergies  Allergen Reactions  . Aspirin Other (See Comments)    G.I. Upset only    Patient Measurements: Height: 5' 4.96" (165 cm) Weight: 171 lb 15.3 oz (78 kg) IBW/kg (Calculated) : 56.91   Vital Signs: Temp: 98.9 F (37.2 C) (02/19 0309) Temp src: Oral (02/19 0309) BP: 106/47 mmHg (02/19 0751) Pulse Rate: 61 (02/19 0751) Intake/Output from previous day:   Intake/Output from this shift:    Labs:  Recent Labs  07/24/13 0422  WBC 7.6  HGB 9.0*  PLT 118*  CREATININE 1.97*   Estimated Creatinine Clearance: 21.1 ml/min (by C-G formula based on Cr of 1.97).    Microbiology: No results found for this or any previous visit (from the past 720 hour(s)).  Medical History: Past Medical History  Diagnosis Date  . Atrial fibrillation   . Altered mental status   . Encephalopathy   . Parkinson disease   . Hypertension   . GERD (gastroesophageal reflux disease)   . Hyperlipemia   . Diabetes mellitus   . Coronary artery disease   . History of adenomatous polyp of colon 06/24/99  . Enlarged heart   . DEMENTIA   . Edema of lower extremity 07/13/11    right leg more swollen than left leg  . Esophageal dysmotility 07/02/12  . Horseshoe kidney   . Pancreatic lesion 05/22/11    no further workup  per PCP/family due to age  . Anemia   . Peripheral neuropathy   . Depression   . Thrombophlebitis     Medications:  Scheduled:   Infusions:  . sodium chloride    . ampicillin-sulbactam (UNASYN) IV     PRN: acetaminophen  Assessment:  80 y/o F brought to ED with N/V, hypotension. Lactate WNL. CT abd c/w possible early colitis, possible bowel ischemia. Empiric Zosyn/vancomycin was initially ordered but then changed to Unasyn. A single dose of Cipro was ordered also.  Elevated SCr noted with estimated CrCl ~ 23 mL/min/72 kg   Goal of Therapy:  Appropriate antibiotic  dosing for renal function; eradication of infection  Plan:  1. Unasyn 3 grams IV stat, then q12h 2. Follow clinical course, serum creatinine, cultures. 3. Increase Unasyn dosage if azotemia begins to resolve.   Clayburn Pert, PharmD, BCPS Pager: [phone] 07/24/2013  10:57 AM

## 2013-07-24 NOTE — ED Notes (Signed)
[facility] paged. 

## 2013-07-24 NOTE — Progress Notes (Addendum)
Patient LX:[id number] Maria Christian      DOB: 01-30-1925      DHR:[id number]  Spoke with patient's daughter trying work her in for between 61 and 2 AM tomorrow.  Will confirm in addendum.   Vladimir Lenhoff L. Lovena Le, MD MBA The Palliative Medicine Team at [facility] Phone: [phone] Pager: [phone]   Confirmed [place] for 1030 am tomorrow.  Terrika Zuver L. Lovena Le, MD MBA The Palliative Medicine Team at [facility] Phone: [phone] Pager: [phone]

## 2013-07-24 NOTE — ED Notes (Signed)
Attempted to call report to floor, nurse is unavailable at this time, call back information left with Network engineer.

## 2013-07-24 NOTE — ED Notes (Signed)
Per facility pt being treated for recurrent UTI and constipation. Past week pt been intermittently hypotensive. Pt c/o arm pain given Aspirin and vomited x1 and hour later.

## 2013-07-24 NOTE — Consult Note (Signed)
WOC wound consult note Reason for Consult:Pressure ulcer and Moisture Associated Skin damage to sacrum and buttocks.  Wound type:Pressure ulcer in conjunction with moisture associated skin damage, incontinent of urine.  Pressure Ulcer POA: Yes Measurement: Sacrum has 4 cm x 4 cm reddened area.  Right gluteal has 1 cm x 1 cm ulcer, unable to visualize wound bed.  100% eschar.  Left gluteal 0.5 cm x 0.5 cm, 100% eschar.   Wound bed: LEft and right gluteal, 100% eschar Drainage (amount, consistency, odor) No drainage, no odor.  Periwound: Sacrum and buttocks are reddened and intact.  Patient reports her gluteal "cheeks hurt ".  Will begin enzymatic debrider to ulcers, offload pressure from sacrum and keep skin clean and dry.  Dressing procedure/placement/frequency: Cleanse sacral and gluteal area with NS and pat gently dry.  Apply Santyl ointment , 1/8 inch thickness (opaque) to areas of eschar.  Cover with NS moist dressing.  Top with dry dressing and secure with tape. Change daily.  Turn and reposition every 2 hours to offload pressure and keep skin clean and dry.   Will not follow at this time.  Please re-consult if needed.  Domenic Moras RN BSN [place] Pager [phone]

## 2013-07-24 NOTE — H&P (Addendum)
[facility] History and Physical  Maria Christian [id number] DOB: May 17, 1925 DOA: 07/24/2013  Referring physician: ER physician. PCP: Ezequiel Kayser, MD   Chief Complaint: Low blood pressure.  History obtained from patient's daughter and previous records and ER physician.  HPI: Maria Christian is a 80 y.o. female with history of atrial fibrillation, diabetes mellitus, CHF, dementia, chronic thrombocytopenia who was admitted last December 2 months ago for sigmoid volvulus was brought to the ER after patient was found to have hypotension and one episode of nausea vomiting. As per patient's daughter patient has not been eating well for last few weeks and yesterday morning patient had one episode of nausea vomiting. Patient had complained of some right upper the pain and was given aspirin. Following which patient was found to be hypotensive. Patient was brought to the ER and was started on IV fluids and abdominal x-ray shows questionable volvulus. UA shows features consistent UTI. Patient's labs also show acute renal failure. On my exam patient is not in acute distress and follows commands. Abdomen appears mildly distended but not tender. Patient had one episode of diarrhea the ER. Patient has been started on Cipro for UTI and is receiving second liter of normal saline bolus for hypotension. Patient otherwise did not complain of any chest pain or shortness of breath. Did not have any fever chills. Moves all extremities.  Review of Systems: As presented in the history of presenting illness, rest negative.  Past Medical History  Diagnosis Date  . Atrial fibrillation   . Altered mental status   . Encephalopathy   . Parkinson disease   . Hypertension   . GERD (gastroesophageal reflux disease)   . Hyperlipemia   . Diabetes mellitus   . Coronary artery disease   . History of adenomatous polyp of colon 06/24/99  . Enlarged heart   . DEMENTIA   . Edema of lower extremity 07/13/11    right  leg more swollen than left leg  . Esophageal dysmotility 07/02/12  . Horseshoe kidney   . Pancreatic lesion 05/22/11    no further workup  per PCP/family due to age  . Anemia   . Peripheral neuropathy   . Depression   . Thrombophlebitis    Past Surgical History  Procedure Laterality Date  . Shoulder surgery  2001    left clavicle excision and acromioplasty  . Abdominal hysterectomy    . Cholecystectomy    . Total hip arthroplasty    . Breast surgery      2 benign tumors removed left breast  . Foot surgery      benign tumors from foot  . Esophageal dilation      several times by Dr. [name]  . Nose surgery    . Esophagogastroduodenoscopy (egd) with esophageal dilation N/A 08/01/2012    Procedure: ESOPHAGOGASTRODUODENOSCOPY (EGD) WITH ESOPHAGEAL DILATION;  Surgeon: [name], MD;  Location: [facility];  Service: Endoscopy;  Laterality: N/A;  with c-arm savory dilators  . Flexible sigmoidoscopy N/A 05/24/2013    Procedure: FLEXIBLE SIGMOIDOSCOPY;  Surgeon: [name], MD;  Location: [facility];  Service: Endoscopy;  Laterality: N/A;  . Flexible sigmoidoscopy N/A 05/26/2013    Procedure:  flex with decompression of sigmoid volvulus;  Surgeon: [name], MD;  Location: [facility];  Service: Endoscopy;  Laterality: N/A;   Social History:  reports that she has quit smoking. She has never used smokeless tobacco. She reports that she does not drink alcohol or use illicit drugs.  Where does patient live nursing home. Can patient participate in ADLs? No.  Allergies  Allergen Reactions  . Aspirin Other (See Comments)    G.I. Upset only    Family History:  Family History  Problem Relation Age of Onset  . Cancer    . Heart disease        Prior to Admission medications   Medication Sig Start Date End Date Taking? Authorizing Provider  acetaminophen (TYLENOL) 325 MG tablet Take 650 mg by mouth 2 (two) times daily. For OA.   Yes Historical Provider, MD   ALPRAZolam (XANAX) 0.25 MG tablet Take 0.25 mg by mouth 2 (two) times daily as needed for anxiety.  04/03/13  Yes Tiffany L Reed, DO  ALPRAZolam (XANAX) 0.25 MG tablet Take 0.25 mg by mouth at bedtime as needed for anxiety.   Yes Historical Provider, MD  ALPRAZolam Prudy Feeler) 0.25 MG tablet Take by mouth. 1 tab once daily at 2pm.   Yes Historical Provider, MD  amLODipine (NORVASC) 5 MG tablet Take 5 mg by mouth daily.   Yes Historical Provider, MD  aspirin 81 MG chewable tablet Chew 81 mg by mouth daily.   Yes Historical Provider, MD  carvedilol (COREG) 3.125 MG tablet Take 3.125 mg by mouth daily.   Yes Historical Provider, MD  cholecalciferol (VITAMIN D) 1000 UNITS tablet Take 1,000 Units by mouth daily.   Yes Historical Provider, MD  clopidogrel (PLAVIX) 75 MG tablet Take 75 mg by mouth daily.    Yes Historical Provider, MD  Cranberry 475 MG CAPS Take 1 capsule by mouth 2 (two) times daily.   Yes Historical Provider, MD  dextromethorphan-guaiFENesin (MUCINEX DM) 30-600 MG per 12 hr tablet Take 1 tablet by mouth every 12 (twelve) hours.   Yes Historical Provider, MD  diclofenac sodium (VOLTAREN) 1 % GEL Apply 2 g topically 2 (two) times daily as needed (left neck pain).    Yes Historical Provider, MD  donepezil (ARICEPT) 5 MG tablet Take 5 mg by mouth at bedtime.   Yes Historical Provider, MD  dorzolamide (TRUSOPT) 2 % ophthalmic solution Place 1 drop into both eyes 3 (three) times daily.    Yes Historical Provider, MD  feeding supplement, ENSURE COMPLETE, (ENSURE COMPLETE) LIQD Take 237 mLs by mouth 2 (two) times daily between meals. 06/02/13  Yes Kathlen Mody, MD  ferrous sulfate 325 (65 FE) MG tablet Take 325 mg by mouth daily with breakfast.   Yes Historical Provider, MD  furosemide (LASIX) 40 MG tablet Take 40 mg by mouth daily.   Yes Historical Provider, MD  gabapentin (NEURONTIN) 300 MG capsule Take 300 mg by mouth 2 (two) times daily.   Yes Historical Provider, MD  ibuprofen (ADVIL,MOTRIN)  400 MG tablet Take 400 mg by mouth daily at 12 noon.    Yes Historical Provider, MD  ketotifen (ZADITOR) 0.025 % ophthalmic solution Place 1 drop into both eyes 2 (two) times daily.   Yes Historical Provider, MD  lactulose (CHRONULAC) 10 GM/15ML solution Take 30 g by mouth 2 (two) times daily.   Yes Historical Provider, MD  lidocaine (LIDODERM) 5 % Place 1 patch onto the skin daily. Apply 1 patch to left lower back/hip every morning.  Remove & Discard patch within 12 hours or as directed by MD   Yes Historical Provider, MD  lisinopril (PRINIVIL,ZESTRIL) 20 MG tablet Take 1 tablet (20 mg total) by mouth daily. 06/02/13  Yes Kathlen Mody, MD  loratadine (CLARITIN) 10 MG tablet Take 10 mg by  mouth daily.   Yes Historical Provider, MD  metFORMIN (GLUCOPHAGE) 1000 MG tablet Take 1,000 mg by mouth 2 (two) times daily with a meal.   Yes Historical Provider, MD  Multiple Vitamins-Minerals (CERTAGEN PO) Take 1 tablet by mouth daily.    Yes Historical Provider, MD  pantoprazole (PROTONIX) 40 MG tablet Take 40 mg by mouth 2 (two) times daily.    Yes Historical Provider, MD  Polyethyl Glycol-Propyl Glycol (SYSTANE OP) Place 1 drop into both eyes 4 (four) times daily.   Yes Historical Provider, MD  polyethylene glycol (MIRALAX / GLYCOLAX) packet Take 17 g by mouth daily.   Yes Historical Provider, MD  potassium chloride SA (K-DUR,KLOR-CON) 20 MEQ tablet Take 20 mEq by mouth daily.    Yes Historical Provider, MD  pravastatin (PRAVACHOL) 20 MG tablet Take 20 mg by mouth at bedtime.    Yes Historical Provider, MD  sertraline (ZOLOFT) 50 MG tablet Take 50 mg by mouth at bedtime.   Yes Historical Provider, MD  Travoprost, BAK Free, (TRAVATAN) 0.004 % SOLN ophthalmic solution Place 2 drops into both eyes at bedtime.    Yes Historical Provider, MD  traZODone (DESYREL) 50 MG tablet Take 100 mg by mouth at bedtime.    Yes Historical Provider, MD  vitamin B-12 (CYANOCOBALAMIN) 500 MCG tablet Take 500 mcg by mouth daily.    Yes Historical Provider, MD  vitamin C (ASCORBIC ACID) 500 MG tablet Take 500 mg by mouth daily.   Yes Historical Provider, MD  ondansetron (ZOFRAN) 4 MG tablet Take 4 mg by mouth every 8 (eight) hours as needed for nausea or vomiting.    Historical Provider, MD    Physical Exam: Filed Vitals:   07/24/13 0309 07/24/13 0619 07/24/13 0647  BP: 104/42 81/28   Pulse: 55 53   Temp: 98.9 F (37.2 C)    TempSrc: Oral    Resp: 18 18   Height:   5' 4.96" (1.65 m)  Weight:   78 kg (171 lb 15.3 oz)  SpO2: 91% 92%      General:  Well-developed and moderately nourished.  Eyes: Anicteric no pallor.  ENT: No discharge from the ears eyes nose mouth.  Neck: No mass felt.  Cardiovascular: S1-S2 heard.  Respiratory: No rhonchi or crepitations.  Abdomen: Soft mildly distended no guarding or rigidity bowel sounds not appreciated.  Skin: Sick relative results are.  Musculoskeletal: No edema.  Psychiatric: Patient is following commands.  Neurologic: Alert awake follows commands. Moves all extremities.  Labs on Admission:  Basic Metabolic Panel:  Recent Labs Lab 07/24/13 0422  NA 138  K 4.6  CL 103  CO2 19  GLUCOSE 164*  BUN 40*  CREATININE 1.97*  CALCIUM 9.3   Liver Function Tests:  Recent Labs Lab 07/24/13 0422  AST 14  ALT 8  ALKPHOS 75  BILITOT <0.2*  PROT 6.8  ALBUMIN 3.6    Recent Labs Lab 07/24/13 0422  LIPASE 31   No results found for this basename: AMMONIA,  in the last 168 hours CBC:  Recent Labs Lab 07/24/13 0422  WBC 7.6  NEUTROABS 6.0  HGB 9.0*  HCT 27.9*  MCV 104.5*  PLT 118*   Cardiac Enzymes: No results found for this basename: CKTOTAL, CKMB, CKMBINDEX, TROPONINI,  in the last 168 hours  BNP (last 3 results)  Recent Labs  05/24/13 0250  PROBNP 3915.0*   CBG: No results found for this basename: GLUCAP,  in the last 168 hours  Radiological Exams  on Admission: Dg Abd Acute W/chest  07/24/2013   CLINICAL DATA:  Vomiting,  abdominal pain.  EXAM: ACUTE ABDOMEN SERIES (ABDOMEN 2 VIEW & CHEST 1 VIEW)  COMPARISON:  05/29/2013 and multiple other Plain films. CT 05/23/2013.  FINDINGS: Cardiomegaly. Lungs are clear. No effusions or acute bony abnormality.  Diffuse gaseous distention of the colon, dilated sigmoid colon. Cannot exclude recurrent sigmoid volvulus. No free air. No organomegaly. Prior cholecystectomy.  IMPRESSION: Diffuse gaseous distention of the colon with most pronounced gaseous distention in the sigmoid colon. Cannot exclude recurrent sigmoid volvulus.   Electronically Signed   By: Rolm Baptise M.D.   On: 07/24/2013 04:23     Assessment/Plan Principal Problem:   Hypotension Active Problems:   Atrial fibrillation with controlled ventricular response   Diastolic CHF, chronic   Diabetes mellitus   UTI (lower urinary tract infection)   Vomiting   ARF (acute renal failure)   1. Hypotension - differentials include developing sepsis versus dehydration. At this time patient is receiving second liter normal saline bolus. Continue with IV hydration. I have increased patient's antibiotics to vancomycin and Zosyn for possible sepsis. Get blood cultures. Check urine cultures. I have also ordered cardiac markers to rule out any ACS. Hold antihypertensives. I have ordered CT abdomen and pelvis with oral contrast to check for intra-abdominal cause particularly volvulus. Check stool studies. 2. Acute renal failure - probably from dehydration. Hold antihypertensives including lisinopril and Lasix. Continue with hydration. Check urine sodium and creatinine. 3. Atrial fibrillation presently bradycardic - EKG is pending. Check cardiac markers. Check TSH. Hold rate limiting medications for now. 4. UTI - see #1. 5. Nausea vomiting with abdominal distention with history of volvulus - CT abdomen and pelvis is ordered. We'll consult surgery. 6. History of diabetes mellitus2 - presently have placed patient n.p.o. Closely follow  CBGs with sliding-scale coverage. 7. Sacral decubitus ulcer - does not look infected. Consult wound team. 8. Hypertension presently hypotensive. 9. History of CHF last EF was 55 to 60% - presently looks dehydrated and receiving fluids.  10. Chronic anemia and thrombocytopenia - follow CBC. 11. History of dementia. 12. History of hyperlipidemia. 13. Metabolic acidosis - probably from renal failure and hypertension. Closely follow metabolic panel.  Patient's EKG cardiac markers and blood cultures are pending. I have discussed with patient's daughter about critical nature of patient's condition. Patient's daughter has confirmed tha patient's DO NOT RESUSCITATE and is agreeable to present measures but Patient's daughter has confirmed on no aggressive measures including vasopressors CPR or intubation.  Code Status: DO NOT RESUSCITATE.  Family Communication: Patient's daughter.  Disposition Plan: Admit to inpatient.    Jailon Schaible N. [facility] Pager [phone].  If 7PM-7AM, please contact night-coverage [email or web address] Password [facility] 07/24/2013, 6:49 AM

## 2013-07-24 NOTE — Consult Note (Signed)
Reason for Consult:Possible bowel ischemia Referring Physician: Dr. Niel Hummer  Maria Christian is an 80 y.o. female.  HPI: 80 y/o brought to the ER from SNF for low BP.  She was seen here in Dec 2014 with Volvulus and required decompression x 2 from.  She is followed by  GI.  She is chronically ill, and bedridden for 8 years since a hip fracture.  BP on arrival here was 110 - 81 systolic.  She has a much better BP now.  Pt has a UTI, and from discussion with the daughters this has been going on for a couple weeks.  She was started on antibiotics yesterday for the UTI.  From a GI standpoint she reports being on Miralax three times a day with each meal and she has BM's about 3 times a day.  She had some nausea and vomiting this  AM with her low blood pressure, but none earlier this week, no blood in her frequent stools reported.  She reports not being very hungry and not wanting to eat much she feels full.  Family reports ongoing weight loss, but they don't have any hard numbers or time frame to describe this.  They think 6-8 pounds, but are not sure of a time frame.  She has no GI complaints of pain, abdominal distension;  no nausea or vomiting till this Am.  She has multiple and regular BM's daily.  Family said it looked sandy this AM. She has had what she calls, "bendover spells," where she is sitting up in chair and and goes to sleep or passes out, no one is sure.  Apparently she wakes from these without issue.  She has not been evaluated for this from what I can see.   On admission to the ER, her creatinine is elevated up ot 1.97, it was in the .66 range in December 2014. WBC is 7.6, Lactic acid is 2.06. LFT's and lipase are also normal.   3 view acute abdomen shows:  CM, lungs were clear, and some dilatation of the sigmoid, with concern for recurrent volvulus.  A CT scan of the abdomen and pelvis were then obtained and show some; Rectosigmoid mild bowel wall thickening to 5 mm without  surroundingstranding, which may be at upper limits of normal given the degree of collapse. However, early colitis could have this appearance. Bowel ischemia is possible. No evidence for bowel obstruction.  On physical exam currently she has no pain or distension, no guarding or signs of any abdominal discomfort. Medical issues are numerous and listed below.  Past Medical History  Diagnosis Date  . Atrial fibrillation   . Altered mental status   . Encephalopathy   . Parkinson disease   . Hypertension   . GERD (gastroesophageal reflux disease)   . Hyperlipemia   . Diabetes mellitus   . Coronary artery disease   . History of adenomatous polyp of colon 06/24/99  . Enlarged heart   . DEMENTIA   . Edema of lower extremity 07/13/11    right leg more swollen than left leg  . Esophageal dysmotility 07/02/12  . Horseshoe kidney   . Pancreatic lesion 05/22/11    no further workup  per PCP/family due to age  . Anemia   . Peripheral neuropathy   . Depression   . Thrombophlebitis     Past Surgical History  Procedure Laterality Date  . Shoulder surgery  2001    left clavicle excision and acromioplasty  . Abdominal hysterectomy    .  Cholecystectomy    . Total hip arthroplasty    . Breast surgery      2 benign tumors removed left breast  . Foot surgery      benign tumors from foot  . Esophageal dilation      several times by Dr. [name]  . Nose surgery    . Esophagogastroduodenoscopy (egd) with esophageal dilation N/A 08/01/2012    Procedure: ESOPHAGOGASTRODUODENOSCOPY (EGD) WITH ESOPHAGEAL DILATION;  Surgeon: [name], MD;  Location: [facility];  Service: Endoscopy;  Laterality: N/A;  with c-arm savory dilators  . Flexible sigmoidoscopy N/A 05/24/2013    Procedure: FLEXIBLE SIGMOIDOSCOPY;  Surgeon: [name], MD;  Location: [facility];  Service: Endoscopy;  Laterality: N/A;  . Flexible sigmoidoscopy N/A 05/26/2013    Procedure:  flex with decompression of sigmoid  volvulus;  Surgeon: [name], MD;  Location: [facility];  Service: Endoscopy;  Laterality: N/A;    Family History  Problem Relation Age of Onset  . Cancer    . Heart disease      Social History:  reports that she has quit smoking. She has never used smokeless tobacco. She reports that she does not drink alcohol or use illicit drugs.  Allergies:  Allergies  Allergen Reactions  . Aspirin Other (See Comments)    G.I. Upset only    Medications:  Prior to Admission:  (Not in a hospital admission) Scheduled:  Continuous: . sodium chloride    . ampicillin-sulbactam (UNASYN) IV 3 g (07/24/13 1123)  . ampicillin-sulbactam (UNASYN) IV     [id number] Anti-infectives   Start     Dose/Rate Route Frequency Ordered Stop   07/25/13 0800  vancomycin (VANCOCIN) IVPB 750 mg/150 ml premix  Status:  Discontinued     750 mg 150 mL/hr over 60 Minutes Intravenous Every 24 hours 07/24/13 0652 07/24/13 1021   07/24/13 2200  Ampicillin-Sulbactam (UNASYN) 3 g in sodium chloride 0.9 % 100 mL IVPB     3 g 100 mL/hr over 60 Minutes Intravenous Every 12 hours 07/24/13 1059     07/24/13 1400  piperacillin-tazobactam (ZOSYN) IVPB 3.375 g  Status:  Discontinued     3.375 g 12.5 mL/hr over 240 Minutes Intravenous 3 times per day 07/24/13 0652 07/24/13 1021   07/24/13 1045  Ampicillin-Sulbactam (UNASYN) 3 g in sodium chloride 0.9 % 100 mL IVPB     3 g 100 mL/hr over 60 Minutes Intravenous STAT 07/24/13 1037 07/25/13 1045   07/24/13 0700  vancomycin (VANCOCIN) IVPB 1000 mg/200 mL premix  Status:  Discontinued     1,000 mg 200 mL/hr over 60 Minutes Intravenous  Once 07/24/13 0646 07/24/13 1037   07/24/13 0700  piperacillin-tazobactam (ZOSYN) IVPB 3.375 g  Status:  Discontinued     3.375 g 100 mL/hr over 30 Minutes Intravenous  Once 07/24/13 0646 07/24/13 1037   07/24/13 0430  ciprofloxacin (CIPRO) IVPB 400 mg     400 mg 200 mL/hr over 60 Minutes Intravenous  Once 07/24/13 0420 07/24/13  0553      Results for orders placed during the hospital encounter of 07/24/13 (from the past 48 hour(s))  URINALYSIS, ROUTINE W REFLEX MICROSCOPIC     Status: Abnormal   Collection Time    07/24/13  3:54 AM      Result Value Ref Range   Color, Urine YELLOW  YELLOW   APPearance TURBID (*) CLEAR   Specific Gravity, Urine 1.017  1.005 - 1.030   pH 5.0  5.0 -  8.0   Glucose, UA NEGATIVE  NEGATIVE mg/dL   Hgb urine dipstick MODERATE (*) NEGATIVE   Bilirubin Urine NEGATIVE  NEGATIVE   Ketones, ur NEGATIVE  NEGATIVE mg/dL   Protein, ur 100 (*) NEGATIVE mg/dL   Urobilinogen, UA 0.2  0.0 - 1.0 mg/dL   Nitrite NEGATIVE  NEGATIVE   Leukocytes, UA LARGE (*) NEGATIVE  URINE MICROSCOPIC-ADD ON     Status: Abnormal   Collection Time    07/24/13  3:54 AM      Result Value Ref Range   Squamous Epithelial / LPF RARE  RARE   WBC, UA TOO NUMEROUS TO COUNT  <3 WBC/hpf   Comment: WITH CLUMPS   Bacteria, UA MANY (*) RARE   Urine-Other MICROSCOPIC EXAM PERFORMED ON UNCONCENTRATED URINE    CBC WITH DIFFERENTIAL     Status: Abnormal   Collection Time    07/24/13  4:22 AM      Result Value Ref Range   WBC 7.6  4.0 - 10.5 K/uL   RBC 2.67 (*) 3.87 - 5.11 MIL/uL   Hemoglobin 9.0 (*) 12.0 - 15.0 g/dL   HCT 27.9 (*) 36.0 - 46.0 %   MCV 104.5 (*) 78.0 - 100.0 fL   MCH 33.7  26.0 - 34.0 pg   MCHC 32.3  30.0 - 36.0 g/dL   RDW 14.7  11.5 - 15.5 %   Platelets 118 (*) 150 - 400 K/uL   Comment: REPEATED TO VERIFY     PLATELET COUNT CONFIRMED BY SMEAR   Neutrophils Relative % 79 (*) 43 - 77 %   Lymphocytes Relative 12  12 - 46 %   Monocytes Relative 8  3 - 12 %   Eosinophils Relative 1  0 - 5 %   Basophils Relative 0  0 - 1 %   Neutro Abs 6.0  1.7 - 7.7 K/uL   Lymphs Abs 0.9  0.7 - 4.0 K/uL   Monocytes Absolute 0.6  0.1 - 1.0 K/uL   Eosinophils Absolute 0.1  0.0 - 0.7 K/uL   Basophils Absolute 0.0  0.0 - 0.1 K/uL   RBC Morphology OVALOCYTES    COMPREHENSIVE METABOLIC PANEL     Status: Abnormal    Collection Time    07/24/13  4:22 AM      Result Value Ref Range   Sodium 138  137 - 147 mEq/L   Potassium 4.6  3.7 - 5.3 mEq/L   Chloride 103  96 - 112 mEq/L   CO2 19  19 - 32 mEq/L   Glucose, Bld 164 (*) 70 - 99 mg/dL   BUN 40 (*) 6 - 23 mg/dL   Creatinine, Ser 1.97 (*) 0.50 - 1.10 mg/dL   Calcium 9.3  8.4 - 10.5 mg/dL   Total Protein 6.8  6.0 - 8.3 g/dL   Albumin 3.6  3.5 - 5.2 g/dL   AST 14  0 - 37 U/L   ALT 8  0 - 35 U/L   Alkaline Phosphatase 75  39 - 117 U/L   Total Bilirubin <0.2 (*) 0.3 - 1.2 mg/dL   GFR calc non Af Amer 22 (*) >90 mL/min   GFR calc Af Amer 25 (*) >90 mL/min   Comment: (NOTE)     The eGFR has been calculated using the CKD EPI equation.     This calculation has not been validated in all clinical situations.     eGFR's persistently <90 mL/min signify possible Chronic Kidney  Disease.  LIPASE, BLOOD     Status: None   Collection Time    07/24/13  4:22 AM      Result Value Ref Range   Lipase 31  11 - 59 U/L  CG4 I-STAT (LACTIC ACID)     Status: None   Collection Time    07/24/13  4:34 AM      Result Value Ref Range   Lactic Acid, Venous 2.06  0.5 - 2.2 mmol/L  POC OCCULT BLOOD, ED     Status: None   Collection Time    07/24/13  5:26 AM      Result Value Ref Range   Fecal Occult Bld NEGATIVE  NEGATIVE  TROPONIN I     Status: None   Collection Time    07/24/13  7:10 AM      Result Value Ref Range   Troponin I <0.30  <0.30 ng/mL   Comment:            Due to the release kinetics of cTnI,     a negative result within the first hours     of the onset of symptoms does not rule out     myocardial infarction with certainty.     If myocardial infarction is still suspected,     repeat the test at appropriate intervals.    Ct Abdomen Pelvis Wo Contrast  07/24/2013   CLINICAL DATA:  Abdominal pain nausea and vomiting  EXAM: CT ABDOMEN AND PELVIS WITHOUT CONTRAST  TECHNIQUE: Multidetector CT imaging of the abdomen and pelvis was performed following the  standard protocol without intravenous contrast.  COMPARISON:  DG ABD ACUTE W/CHEST dated 07/24/2013; CT ABD/PELVIS W CM dated 05/23/2013  FINDINGS: The rectosigmoid colon is decompressed with mild wall thickening measuring 5 mm but no surrounding stranding or dilatation. Small bowel caliber is normal. Streak artifact from left total hip arthroplasty obscures detail of the pelvis. No free air or fluid. No lymphadenopathy.  Trace bilateral pleural effusions with associated compressive atelectasis noted. Cardiomegaly is present.  Cholecystectomy clips are noted. Unenhanced liver, adrenal glands, spleen, and pancreas are normal. Horseshoe kidney configuration reidentified with vascular calcifications and vascular calcification or nonobstructing left renal moiety 3 mm calculus image 41.  Moderate to severe atheromatous aortic calcification without aneurysm. The bladder is mildly thick walled, 5 mm. No radiopaque ureteral or bladder calculus is identified. The patient is rotated. Multilevel disc degenerative changes identified but no acute osseous finding is present. No compression deformity.  IMPRESSION: Rectosigmoid mild bowel wall thickening to 5 mm without surrounding stranding, which may be at upper limits of normal given the degree of collapse. However, early colitis could have this appearance. Bowel ischemia is possible. No evidence for bowel obstruction.  Mild bladder wall thickening, correlate with urinalysis for evidence of urinary tract infection.   Electronically Signed   By: Conchita Paris M.D.   On: 07/24/2013 09:29   Dg Abd Acute W/chest  07/24/2013   CLINICAL DATA:  Vomiting, abdominal pain.  EXAM: ACUTE ABDOMEN SERIES (ABDOMEN 2 VIEW & CHEST 1 VIEW)  COMPARISON:  05/29/2013 and multiple other Plain films. CT 05/23/2013.  FINDINGS: Cardiomegaly. Lungs are clear. No effusions or acute bony abnormality.  Diffuse gaseous distention of the colon, dilated sigmoid colon. Cannot exclude recurrent sigmoid  volvulus. No free air. No organomegaly. Prior cholecystectomy.  IMPRESSION: Diffuse gaseous distention of the colon with most pronounced gaseous distention in the sigmoid colon. Cannot exclude recurrent sigmoid volvulus.   Electronically Signed  By: Rolm Baptise M.D.   On: 07/24/2013 04:23    Review of Systems  Constitutional: Positive for weight loss.       Pt bedridden for 8 years since fx hip.  Chronically ill and debilitated.   She is bed to chair with assist only.  HENT: Negative.   Eyes:       Decreased vision  Respiratory: Positive for cough.        On O2 an night for some years.  Family not really sure. Family is unsure about the cough  Cardiovascular: Positive for leg swelling. Negative for chest pain, palpitations and orthopnea.  Gastrointestinal: Positive for nausea (Some this AM, none yesterday or earlier in the week, none currently.), vomiting (some this AM with low BP) and diarrhea (family says she is on Miralax TID with meals.  Stools were described as sandy looking by daughter this AM). Negative for heartburn, abdominal pain (no abdominal pain or complaints), constipation (no constipation having 3 BM's per day), blood in stool and melena.  Genitourinary: Positive for dysuria (she has had some discomfort for 1-2 weeks, but just started on treatment yesterday.l). Negative for urgency, frequency, hematuria and flank pain.  Musculoskeletal:       She is confined to the bed, has not been able to walk for 8 years per family.  Skin:       She has some early stage 1 sacral decubitus  Neurological: Positive for loss of consciousness. Negative for seizures.       Pt has what she calls, "Bendover spells."  She says she would be sitting up and she would fall over and it sounds like she's asleep, but she doesn't know.  It sounds like she is having those frequently. She has generalized weakness and they are able to stand her with a device, that gets her to chair and toilet.    Psychiatric/Behavioral: Negative.    Blood pressure 106/47, pulse 61, temperature 98.9 F (37.2 C), temperature source Oral, resp. rate 11, height 5' 4.96" (1.65 m), weight 78 kg (171 lb 15.3 oz), SpO2 96.00%. Physical Exam  Constitutional: She is oriented to person, place, and time. No distress.  Extremely fragile elderly WF, awake and talking with good mentation currently. Appears chronically ill.  HENT:  Head: Normocephalic and atraumatic.  Nose: Nose normal.  Eyes: Conjunctivae and EOM are normal. Pupils are equal, round, and reactive to light. Right eye exhibits no discharge. Left eye exhibits no discharge. No scleral icterus.  Neck: Normal range of motion. Neck supple. No JVD present. No tracheal deviation present. No thyromegaly present.  Cardiovascular: Normal rate and normal heart sounds.  Exam reveals no gallop.   No murmur heard. Irregular irregular rate Split s2  Respiratory: Effort normal and breath sounds normal. No respiratory distress. She has no wheezes. She has no rales. She exhibits no tenderness.  GI: Soft. Bowel sounds are normal. She exhibits no distension and no mass. There is no tenderness. There is no rebound and no guarding.  Mid line and RUQ scars from prior surgeries.  Musculoskeletal: She exhibits edema (trace).  Lymphadenopathy:    She has no cervical adenopathy.  Neurological: She is alert and oriented to person, place, and time. No cranial nerve deficit.  Skin: She is not diaphoretic.  Psychiatric: She has a normal mood and affect. Her behavior is normal. Thought content normal.    Assessment/Plan: 1.  Hypotension with  Acute renal failure 2.  UTI 3.  Hx of Volvulus with  decompression x 06 May 2013. Dr. Tyrell Antonio was concerned she might have some abdominal  ischemia. 4.  Hx of DCHF/on O2 at night for some years 5.  Atrial fibrillation/CAD 6.  Hx of hypertension 7.  Prior hx of dementia/Parkinson's disease 8.  Bedridden for many years after hip  fracture 9.  Early decubitus 10.  AODM 11.  Anemia 12.  "Bendover spells." 13.  DNR   Plan:  I have reviewed the CT with Dr. Lucia Gaskins,  she does have some thickening as described, but nothing to suggest any ischemia.  She has had no abdominal discomfort before admission or now in the ER.  She has only had one episode of nausea or vomiting when she was hypotensive in the SNF prior to admission.  Lactic acid is also normal.  There are no beds here at [facility] and she may be transferred to [facility].  Dr. Lucia Gaskins or Dr. Redmond Pulling will see here after they get out of surgery.  We will follow with you.     Sammye Staff 07/24/2013, 10:52 AM

## 2013-07-24 NOTE — Progress Notes (Signed)
  Echocardiogram 2D Echocardiogram has been performed.  Basilia Jumbo 07/24/2013, 2:57 PM

## 2013-07-24 NOTE — Progress Notes (Addendum)
Patient seen and examined. Complaining of right shoulder pain.  BP improved with IV fluids. Will start Unasyn to cover for intraabdominal infection. CT abdomen showed colitis vs ischemic Bowel. Surgery will see patient. They are aware that patient will be transfer to [facility]. Continue with IV fluids. Due to hypotension will check ECHO and cortisol level.  Holston Oyama, Md.

## 2013-07-24 NOTE — Progress Notes (Signed)
Clinical Social Work Department BRIEF PSYCHOSOCIAL ASSESSMENT 07/24/2013  Patient:  Maria Christian, Maria Christian     Account Number:  [id number]     Admit date:  07/24/2013  Clinical Social Worker:  [employer]  Date/Time:  07/24/2013 11:30 AM  Referred by:  CSW  Date Referred:  07/24/2013 Referred for  SNF Placement   Other Referral:   Interview type:  Family Other interview type:   Patient daughter, Maria Christian    PSYCHOSOCIAL DATA Living Status:  FACILITY Admitted from facility:  [place] Level of care:  [place] Primary support name:  Maria Christian Primary support relationship to patient:  CHILD, ADULT Degree of support available:   daughter    Pt son, Maria Christian, lives in [place], and is [place].    CURRENT CONCERNS Current Concerns  Post-Acute Placement   Other Concerns:    SOCIAL WORK ASSESSMENT / PLAN CSW met wtih pt, and pt daughter at bedside to complete psychosocial assessmnet. Patient currently unable to engage in assessment and has history of dementia.    CSW introduced self and explained csw role. Pt dtr shared that patient is a resident at [facility] facility. Patient daughter stated that she is not sure if patient will be able to return. Patient is waiting for a palliative care consult. CSW provided supportive counseling. Pt daughter shared her mother is a DNR and she knows she does not want extreme life saving measures. CSW and pt discuss the role of palliative care and the role of the goals of care meeting. Pt daughter appreciated the explanation and the support.    Pt daughter shared that patient has nother osn who lives in [place]. Pt son is also patient POA. Patient daughter shared that patient has a living willl and will be abel to provide that to the hosptial.   Assessment/plan status:  Psychosocial Support/Ongoing Assessment of Needs Other assessment/ plan:   Information/referral to community  resources:   none identified at this time    PATIENT'S/FAMILY'S RESPONSE TO PLAN OF CARE: Patient family thanked csw for concern and support. Patient disposition is uncler at this time. Patient may be returning to [facility] or needing a different level of care. Patient family awaiting palliative care consult.       Maria Christian [phone]  ED CSW 07/24/2013 1324pm

## 2013-07-24 NOTE — ED Notes (Signed)
Unable to obtain 2nd set of blood cultures. 

## 2013-07-24 NOTE — Progress Notes (Signed)
ANTIBIOTIC CONSULT NOTE - INITIAL  Pharmacy Consult for Vancomycin and Zosyn  Indication: rule out sepsis  Allergies  Allergen Reactions  . Aspirin Other (See Comments)    G.I. Upset only    Patient Measurements: Height: 5' 4.96" (165 cm) Weight: 171 lb 15.3 oz (78 kg) IBW/kg (Calculated) : 56.91 Adjusted Body Weight:   Vital Signs: Temp: 98.9 F (37.2 C) (02/19 0309) Temp src: Oral (02/19 0309) BP: 81/28 mmHg (02/19 0619) Pulse Rate: 53 (02/19 0619) Intake/Output from previous day:   Intake/Output from this shift:    Labs:  Recent Labs  07/24/13 0422  WBC 7.6  HGB 9.0*  PLT 118*  CREATININE 1.97*   Estimated Creatinine Clearance: 21.1 ml/min (by C-G formula based on Cr of 1.97). No results found for this basename: VANCOTROUGH, VANCOPEAK, VANCORANDOM, GENTTROUGH, GENTPEAK, GENTRANDOM, TOBRATROUGH, TOBRAPEAK, TOBRARND, AMIKACINPEAK, AMIKACINTROU, AMIKACIN,  in the last 72 hours   Microbiology: No results found for this or any previous visit (from the past 720 hour(s)).  Medical History: Past Medical History  Diagnosis Date  . Atrial fibrillation   . Altered mental status   . Encephalopathy   . Parkinson disease   . Hypertension   . GERD (gastroesophageal reflux disease)   . Hyperlipemia   . Diabetes mellitus   . Coronary artery disease   . History of adenomatous polyp of colon 06/24/99  . Enlarged heart   . DEMENTIA   . Edema of lower extremity 07/13/11    right leg more swollen than left leg  . Esophageal dysmotility 07/02/12  . Horseshoe kidney   . Pancreatic lesion 05/22/11    no further workup  per PCP/family due to age  . Anemia   . Peripheral neuropathy   . Depression   . Thrombophlebitis     Medications:  Anti-infectives   Start     Dose/Rate Route Frequency Ordered Stop   07/25/13 0800  vancomycin (VANCOCIN) IVPB 750 mg/150 ml premix     750 mg 150 mL/hr over 60 Minutes Intravenous Every 24 hours 07/24/13 0652     07/24/13 1400   piperacillin-tazobactam (ZOSYN) IVPB 3.375 g     3.375 g 12.5 mL/hr over 240 Minutes Intravenous 3 times per day 07/24/13 0652     07/24/13 0700  vancomycin (VANCOCIN) IVPB 1000 mg/200 mL premix     1,000 mg 200 mL/hr over 60 Minutes Intravenous  Once 07/24/13 0646     07/24/13 0700  piperacillin-tazobactam (ZOSYN) IVPB 3.375 g     3.375 g 100 mL/hr over 30 Minutes Intravenous  Once 07/24/13 0646     07/24/13 0430  ciprofloxacin (CIPRO) IVPB 400 mg     400 mg 200 mL/hr over 60 Minutes Intravenous  Once 07/24/13 0420 07/24/13 0553     Assessment: Patient with sepsis.    Goal of Therapy:  Vancomycin trough level 15-20 mcg/ml Zosyn based on renal function   Plan:  Measure antibiotic drug levels at steady state Follow up culture results Vancomycin 750mg  iv q24hr, Zosyn 3.375g IV Q8H infused over 4hrs.   Tyler Deis, Shea Stakes Crowford 07/24/2013,6:54 AM

## 2013-07-24 NOTE — ED Notes (Signed)
Occult card result was negative

## 2013-07-24 NOTE — Progress Notes (Signed)
UR completed 

## 2013-07-24 NOTE — ED Provider Notes (Signed)
CSN: [id number]     Arrival date & time 07/24/13  0310 History   First MD Initiated Contact with Patient 07/24/13 0315     Chief Complaint  Patient presents with  . Weakness  . Hypotension     (Consider location/radiation/quality/duration/timing/severity/associated sxs/prior Treatment) HPI 80 year old female presents to emergency apartment from her nursing facility via EMS after an episode of nausea and vomiting.  Patient had complained of arm pain early in the evening, and was given aspirin.  Per her prior records, patient has sensitivity to aspirin with nausea and vomiting.  Patient also has complicated history of recurrent sigmoid volvulus.  She was admitted for reduction of same in December.  Patient reports bowel movement yesterday.  She has had problems with chronic constipation since having her volvulus resolved.  Patient is also reportedly being treated for a urinary tract infection, although no antibiotic is listed on her MAR.  No fevers no chills.  Patient denies any abdominal pain.  She appears to have some abdominal distention.  Patient's daughter is at the bedside, and she reports that they have been advised against surgery for her sigmoid volvulus, but have advised that she may require in the future. Past Medical History  Diagnosis Date  . Atrial fibrillation   . Altered mental status   . Encephalopathy   . Parkinson disease   . Hypertension   . GERD (gastroesophageal reflux disease)   . Hyperlipemia   . Diabetes mellitus   . Coronary artery disease   . History of adenomatous polyp of colon 06/24/99  . Enlarged heart   . DEMENTIA   . Edema of lower extremity 07/13/11    right leg more swollen than left leg  . Esophageal dysmotility 07/02/12  . Horseshoe kidney   . Pancreatic lesion 05/22/11    no further workup  per PCP/family due to age  . Anemia   . Peripheral neuropathy   . Depression   . Thrombophlebitis    Past Surgical History  Procedure Laterality Date  .  Shoulder surgery  2001    left clavicle excision and acromioplasty  . Abdominal hysterectomy    . Cholecystectomy    . Total hip arthroplasty    . Breast surgery      2 benign tumors removed left breast  . Foot surgery      benign tumors from foot  . Esophageal dilation      several times by Dr. [name]  . Nose surgery    . Esophagogastroduodenoscopy (egd) with esophageal dilation N/A 08/01/2012    Procedure: ESOPHAGOGASTRODUODENOSCOPY (EGD) WITH ESOPHAGEAL DILATION;  Surgeon: [name], MD;  Location: [facility];  Service: Endoscopy;  Laterality: N/A;  with c-arm savory dilators  . Flexible sigmoidoscopy N/A 05/24/2013    Procedure: FLEXIBLE SIGMOIDOSCOPY;  Surgeon: [name], MD;  Location: [facility];  Service: Endoscopy;  Laterality: N/A;  . Flexible sigmoidoscopy N/A 05/26/2013    Procedure:  flex with decompression of sigmoid volvulus;  Surgeon: [name], MD;  Location: [facility];  Service: Endoscopy;  Laterality: N/A;   Family History  Problem Relation Age of Onset  . Cancer    . Heart disease     History  Substance Use Topics  . Smoking status: Former Research scientist (life sciences)  . Smokeless tobacco: Never Used  . Alcohol Use: No   OB History   Grav Para Term Preterm Abortions TAB SAB Ect Mult Living  Review of Systems  Unable to perform ROS: Dementia      Allergies  Aspirin  Home Medications   Current Outpatient Rx  Name  Route  Sig  Dispense  Refill  . acetaminophen (TYLENOL) 325 MG tablet   Oral   Take 650 mg by mouth 2 (two) times daily. For OA.         Marland Kitchen ALPRAZolam (XANAX) 0.25 MG tablet   Oral   Take 0.25 mg by mouth 2 (two) times daily as needed for anxiety.          . ALPRAZolam (XANAX) 0.25 MG tablet   Oral   Take 0.25 mg by mouth at bedtime as needed for anxiety.         . ALPRAZolam (XANAX) 0.25 MG tablet   Oral   Take by mouth. 1 tab once daily at 2pm.         . amLODipine (NORVASC) 5 MG tablet   Oral    Take 5 mg by mouth daily.         Marland Kitchen aspirin 81 MG chewable tablet   Oral   Chew 81 mg by mouth daily.         . carvedilol (COREG) 3.125 MG tablet   Oral   Take 3.125 mg by mouth daily.         . cholecalciferol (VITAMIN D) 1000 UNITS tablet   Oral   Take 1,000 Units by mouth daily.         . clopidogrel (PLAVIX) 75 MG tablet   Oral   Take 75 mg by mouth daily.          . Cranberry 475 MG CAPS   Oral   Take 1 capsule by mouth 2 (two) times daily.         Marland Kitchen dextromethorphan-guaiFENesin (MUCINEX DM) 30-600 MG per 12 hr tablet   Oral   Take 1 tablet by mouth every 12 (twelve) hours.         . diclofenac sodium (VOLTAREN) 1 % GEL   Topical   Apply 2 g topically 2 (two) times daily as needed (left neck pain).          Marland Kitchen donepezil (ARICEPT) 5 MG tablet   Oral   Take 5 mg by mouth at bedtime.         . dorzolamide (TRUSOPT) 2 % ophthalmic solution   Both Eyes   Place 1 drop into both eyes 3 (three) times daily.          . feeding supplement, ENSURE COMPLETE, (ENSURE COMPLETE) LIQD   Oral   Take 237 mLs by mouth 2 (two) times daily between meals.         . ferrous sulfate 325 (65 FE) MG tablet   Oral   Take 325 mg by mouth daily with breakfast.         . furosemide (LASIX) 40 MG tablet   Oral   Take 40 mg by mouth daily.         Marland Kitchen gabapentin (NEURONTIN) 300 MG capsule   Oral   Take 300 mg by mouth 2 (two) times daily.         Marland Kitchen ibuprofen (ADVIL,MOTRIN) 400 MG tablet   Oral   Take 400 mg by mouth daily at 12 noon.          Marland Kitchen ketotifen (ZADITOR) 0.025 % ophthalmic solution   Both Eyes   Place 1 drop into both eyes 2 (two)  times daily.         Marland Kitchen lactulose (CHRONULAC) 10 GM/15ML solution   Oral   Take 30 g by mouth 2 (two) times daily.         Marland Kitchen lidocaine (LIDODERM) 5 %   Transdermal   Place 1 patch onto the skin daily. Apply 1 patch to left lower back/hip every morning.  Remove & Discard patch within 12 hours or as directed by  MD         . lisinopril (PRINIVIL,ZESTRIL) 20 MG tablet   Oral   Take 1 tablet (20 mg total) by mouth daily.         Marland Kitchen loratadine (CLARITIN) 10 MG tablet   Oral   Take 10 mg by mouth daily.         . metFORMIN (GLUCOPHAGE) 1000 MG tablet   Oral   Take 1,000 mg by mouth 2 (two) times daily with a meal.         . Multiple Vitamins-Minerals (CERTAGEN PO)   Oral   Take 1 tablet by mouth daily.          . pantoprazole (PROTONIX) 40 MG tablet   Oral   Take 40 mg by mouth 2 (two) times daily.          Vladimir Faster Glycol-Propyl Glycol (SYSTANE OP)   Both Eyes   Place 1 drop into both eyes 4 (four) times daily.         . polyethylene glycol (MIRALAX / GLYCOLAX) packet   Oral   Take 17 g by mouth daily.         . potassium chloride SA (K-DUR,KLOR-CON) 20 MEQ tablet   Oral   Take 20 mEq by mouth daily.          . pravastatin (PRAVACHOL) 20 MG tablet   Oral   Take 20 mg by mouth at bedtime.          . sertraline (ZOLOFT) 50 MG tablet   Oral   Take 50 mg by mouth at bedtime.         . Travoprost, BAK Free, (TRAVATAN) 0.004 % SOLN ophthalmic solution   Both Eyes   Place 2 drops into both eyes at bedtime.          . traZODone (DESYREL) 50 MG tablet   Oral   Take 100 mg by mouth at bedtime.          . vitamin B-12 (CYANOCOBALAMIN) 500 MCG tablet   Oral   Take 500 mcg by mouth daily.         . vitamin C (ASCORBIC ACID) 500 MG tablet   Oral   Take 500 mg by mouth daily.         . ondansetron (ZOFRAN) 4 MG tablet   Oral   Take 4 mg by mouth every 8 (eight) hours as needed for nausea or vomiting.          BP 104/42  Pulse 55  Temp(Src) 98.9 F (37.2 C) (Oral)  Resp 18  SpO2 91% Physical Exam  Constitutional:  Frail elderly, female, uncomfortable appearing  HENT:  Head: Normocephalic and atraumatic.  Nose: Nose normal.  Mouth/Throat: Oropharynx is clear and moist.  Eyes: Conjunctivae and EOM are normal.  Neck: Normal range of  motion. Neck supple. No JVD present. No tracheal deviation present. No thyromegaly present.  Cardiovascular: Normal rate, regular rhythm, normal heart sounds and intact distal pulses.  Exam reveals no gallop and  no friction rub.   No murmur heard. Pulmonary/Chest: Effort normal. No stridor. No respiratory distress. She has no wheezes. She has no rales. She exhibits no tenderness.  Abdominal: She exhibits distension.  High-pitched bowel sounds.  Abdomen is distended, but soft.  Nontender to palpation  Genitourinary: Guaiac negative stool.  Watery green stool on rectal exam.  No hemorrhoids noted.  Patient has cream coating pressure ulcer on sacrum  Musculoskeletal: Normal range of motion. She exhibits no edema and no tenderness.  Lymphadenopathy:    She has no cervical adenopathy.  Skin: Skin is warm and dry. No rash noted. No erythema. No pallor.    ED Course  Procedures (including critical care time) Labs Review Labs Reviewed  CBC WITH DIFFERENTIAL - Abnormal; Notable for the following:    RBC 2.67 (*)    Hemoglobin 9.0 (*)    HCT 27.9 (*)    MCV 104.5 (*)    Platelets 118 (*)    Neutrophils Relative % 79 (*)    All other components within normal limits  COMPREHENSIVE METABOLIC PANEL - Abnormal; Notable for the following:    Glucose, Bld 164 (*)    BUN 40 (*)    Creatinine, Ser 1.97 (*)    Total Bilirubin <0.2 (*)    GFR calc non Af Amer 22 (*)    GFR calc Af Amer 25 (*)    All other components within normal limits  URINALYSIS, ROUTINE W REFLEX MICROSCOPIC - Abnormal; Notable for the following:    APPearance TURBID (*)    Hgb urine dipstick MODERATE (*)    Protein, ur 100 (*)    Leukocytes, UA LARGE (*)    All other components within normal limits  URINE MICROSCOPIC-ADD ON - Abnormal; Notable for the following:    Bacteria, UA MANY (*)    All other components within normal limits  URINE CULTURE  LIPASE, BLOOD  CG4 I-STAT (LACTIC ACID)   Imaging Review Dg Abd Acute  W/chest  07/24/2013   CLINICAL DATA:  Vomiting, abdominal pain.  EXAM: ACUTE ABDOMEN SERIES (ABDOMEN 2 VIEW & CHEST 1 VIEW)  COMPARISON:  05/29/2013 and multiple other Plain films. CT 05/23/2013.  FINDINGS: Cardiomegaly. Lungs are clear. No effusions or acute bony abnormality.  Diffuse gaseous distention of the colon, dilated sigmoid colon. Cannot exclude recurrent sigmoid volvulus. No free air. No organomegaly. Prior cholecystectomy.  IMPRESSION: Diffuse gaseous distention of the colon with most pronounced gaseous distention in the sigmoid colon. Cannot exclude recurrent sigmoid volvulus.   Electronically Signed   By: Rolm Baptise M.D.   On: 07/24/2013 04:23    EKG Interpretation   None       MDM   Final diagnoses:  Dehydration  UTI (lower urinary tract infection)  Renal insufficiency  Sacral decubitus ulcer    80 year old female with nausea, vomiting tonight, after being given aspirin.  Differential includes aspirin sensitivity, recurrent sigmoid volvulus, gastroenteritis, urinary tract infection.  We'll get labs, acute abdominal series, and follow closely  5:15 AM Patient reexamined.  Daughter reports that she's been having some spells over the past week, where she seems to fall asleep and is difficult to arouse.  Patient still without abdominal pain.  Abdomen reexamined, soft, still slightly distended. No further nausea or vomiting. X-ray with diffuse gaseous distention of the colon, but no specific sigmoid volvulus, though it cannot be excluded.  Lactate is normal.  She's had a significant increase in her BUN and creatinine, and a slight drop in  H&H.  There is no reported bloody stools.  Will do Hemoccult.  Patient with significant urinary tract infection, has started Cipro.  Will discuss with hospitalist for admission for rehydration and treatment of UTI, and continued abdominal exams for possible sigmoid volvulus.   Kalman Drape, MD 07/24/13 [phone]

## 2013-07-25 DIAGNOSIS — R531 Weakness: Secondary | ICD-10-CM

## 2013-07-25 DIAGNOSIS — Z515 Encounter for palliative care: Secondary | ICD-10-CM

## 2013-07-25 DIAGNOSIS — R5381 Other malaise: Secondary | ICD-10-CM

## 2013-07-25 DIAGNOSIS — E860 Dehydration: Secondary | ICD-10-CM | POA: Diagnosis present

## 2013-07-25 DIAGNOSIS — R5383 Other fatigue: Secondary | ICD-10-CM

## 2013-07-25 LAB — GLUCOSE, CAPILLARY
GLUCOSE-CAPILLARY: 187 mg/dL — AB (ref 70–99)
GLUCOSE-CAPILLARY: 141 mg/dL — AB (ref 70–99)
Glucose-Capillary: 115 mg/dL — ABNORMAL HIGH (ref 70–99)
Glucose-Capillary: 126 mg/dL — ABNORMAL HIGH (ref 70–99)

## 2013-07-25 LAB — CBC
HEMATOCRIT: 26.7 % — AB (ref 36.0–46.0)
Hemoglobin: 8.6 g/dL — ABNORMAL LOW (ref 12.0–15.0)
MCH: 33.6 pg (ref 26.0–34.0)
MCHC: 32.2 g/dL (ref 30.0–36.0)
MCV: 104.3 fL — AB (ref 78.0–100.0)
PLATELETS: 113 10*3/uL — AB (ref 150–400)
RBC: 2.56 MIL/uL — AB (ref 3.87–5.11)
RDW: 15.1 % (ref 11.5–15.5)
WBC: 5.1 10*3/uL (ref 4.0–10.5)

## 2013-07-25 LAB — BASIC METABOLIC PANEL
BUN: 30 mg/dL — ABNORMAL HIGH (ref 6–23)
CALCIUM: 8.2 mg/dL — AB (ref 8.4–10.5)
CO2: 17 mEq/L — ABNORMAL LOW (ref 19–32)
Chloride: 114 mEq/L — ABNORMAL HIGH (ref 96–112)
Creatinine, Ser: 1.45 mg/dL — ABNORMAL HIGH (ref 0.50–1.10)
GFR calc non Af Amer: 32 mL/min — ABNORMAL LOW (ref >90)
GFR, EST AFRICAN AMERICAN: 37 mL/min — AB (ref >90)
GLUCOSE: 123 mg/dL — AB (ref 70–99)
POTASSIUM: 5 meq/L (ref 3.7–5.3)
Sodium: 142 mEq/L (ref 137–147)

## 2013-07-25 LAB — TSH: TSH: 2.363 u[IU]/mL (ref 0.350–4.500)

## 2013-07-25 MED ORDER — SERTRALINE HCL 50 MG PO TABS
50.0000 mg | ORAL_TABLET | Freq: Every day | ORAL | Status: DC
  Administered 2013-07-25 – 2013-07-27 (×3): 50 mg via ORAL
  Filled 2013-07-25 (×5): qty 1

## 2013-07-25 MED ORDER — SODIUM CHLORIDE 0.9 % IV SOLN
INTRAVENOUS | Status: DC
  Administered 2013-07-25: 18:00:00 via INTRAVENOUS
  Administered 2013-07-26: 20 mL/h via INTRAVENOUS

## 2013-07-25 MED ORDER — CARVEDILOL 3.125 MG PO TABS
3.1250 mg | ORAL_TABLET | Freq: Every day | ORAL | Status: DC
  Administered 2013-07-25 – 2013-07-28 (×4): 3.125 mg via ORAL
  Filled 2013-07-25 (×4): qty 1

## 2013-07-25 MED ORDER — LACTULOSE 10 GM/15ML PO SOLN
30.0000 g | Freq: Two times a day (BID) | ORAL | Status: DC
  Administered 2013-07-25 – 2013-07-28 (×6): 30 g via ORAL
  Filled 2013-07-25 (×8): qty 45

## 2013-07-25 MED ORDER — POLYETHYLENE GLYCOL 3350 17 G PO PACK
17.0000 g | PACK | Freq: Every day | ORAL | Status: DC
  Administered 2013-07-26 – 2013-07-28 (×3): 17 g via ORAL
  Filled 2013-07-25 (×4): qty 1

## 2013-07-25 MED ORDER — KETOTIFEN FUMARATE 0.025 % OP SOLN
1.0000 [drp] | Freq: Two times a day (BID) | OPHTHALMIC | Status: DC
  Administered 2013-07-25 – 2013-07-28 (×6): 1 [drp] via OPHTHALMIC
  Filled 2013-07-25 (×2): qty 5

## 2013-07-25 MED ORDER — ENSURE COMPLETE PO LIQD
237.0000 mL | Freq: Two times a day (BID) | ORAL | Status: DC
  Administered 2013-07-26 – 2013-07-28 (×5): 237 mL via ORAL

## 2013-07-25 MED ORDER — DEXTROSE 5 % IV SOLN
1.0000 g | INTRAVENOUS | Status: DC
  Administered 2013-07-25 – 2013-07-27 (×3): 1 g via INTRAVENOUS
  Filled 2013-07-25 (×4): qty 10

## 2013-07-25 MED ORDER — CLOPIDOGREL BISULFATE 75 MG PO TABS
75.0000 mg | ORAL_TABLET | Freq: Every day | ORAL | Status: DC
  Administered 2013-07-25 – 2013-07-28 (×4): 75 mg via ORAL
  Filled 2013-07-25 (×4): qty 1

## 2013-07-25 MED ORDER — DONEPEZIL HCL 5 MG PO TABS
5.0000 mg | ORAL_TABLET | Freq: Every day | ORAL | Status: DC
  Administered 2013-07-25 – 2013-07-27 (×3): 5 mg via ORAL
  Filled 2013-07-25 (×5): qty 1

## 2013-07-25 MED ORDER — DORZOLAMIDE HCL 2 % OP SOLN
1.0000 [drp] | Freq: Three times a day (TID) | OPHTHALMIC | Status: DC
  Administered 2013-07-25 – 2013-07-28 (×8): 1 [drp] via OPHTHALMIC
  Filled 2013-07-25 (×2): qty 10

## 2013-07-25 MED ORDER — ALPRAZOLAM 0.25 MG PO TABS: 0.2500 mg | ORAL_TABLET | Freq: Two times a day (BID) | ORAL | Status: DC | PRN

## 2013-07-25 MED ORDER — ALPRAZOLAM 0.25 MG PO TABS: 0.2500 mg | ORAL_TABLET | Freq: Every evening | ORAL | Status: DC | PRN

## 2013-07-25 NOTE — Progress Notes (Signed)
Report called to Apolonio Schneiders, RN on 5W.  Updated on patient's medical history and current condition.  Patient is stable.  Will prepare to transfer at this time.

## 2013-07-25 NOTE — Progress Notes (Signed)
NURSING PROGRESS NOTE  Maria Christian [id number] Transfer Data: 07/25/2013 6:31 PM Attending Provider: Cherene Altes, MD TLX:[id number],CBULAG, MD Code Status: DNR   Maria Christian is a 80 y.o. female patient transferred from [place] -No acute distress noted.  -No complaints of shortness of breath.  -No complaints of chest pain.   Pt oriented to self. Family at bedside.    Blood pressure 146/80, pulse 80, temperature 99.1 F (37.3 C), temperature source Oral, resp. rate 18, height 5\' 5"  (1.651 m), weight 76.386 kg (168 lb 6.4 oz), SpO2 96.00%.   IV Fluids:  IV in place, occlusive dsg intact without redness, IV cath wrist right, condition patent and no redness normal saline.   Allergies:  Aspirin  Past Medical History:   has a past medical history of Atrial fibrillation; Altered mental status; Encephalopathy; Parkinson disease; Hypertension; GERD (gastroesophageal reflux disease); Hyperlipemia; Diabetes mellitus; Coronary artery disease; History of adenomatous polyp of colon (06/24/99); Enlarged heart; DEMENTIA; Edema of lower extremity (07/13/11); Esophageal dysmotility (07/02/12); Horseshoe kidney; Pancreatic lesion (05/22/11); Anemia; Peripheral neuropathy; Depression; and Thrombophlebitis.  Past Surgical History:   has past surgical history that includes Shoulder surgery (2001); Abdominal hysterectomy; Cholecystectomy; Total hip arthroplasty; Breast surgery; Foot surgery; Esophageal dilation; Nose surgery; Esophagogastroduodenoscopy (egd) with esophageal dilation (N/A, 08/01/2012); Flexible sigmoidoscopy (N/A, 05/24/2013); and Flexible sigmoidoscopy (N/A, 05/26/2013).  Social History:   reports that she has quit smoking. She has never used smokeless tobacco. She reports that she does not drink alcohol or use illicit drugs.  Skin:  Stage 2 on sacrum. Reddened area around sacral wound that is blanchable.    Patient/Family orientated to room. Information packet given to patient/family.  Admission inpatient armband information verified with patient/family to include name and date of birth and placed on patient arm. Side rails up x 2, fall assessment and education completed with patient/family. Patient/family able to verbalize understanding of risk associated with falls and verbalized understanding to call for assistance before getting out of bed. Call light within reach. Patient/family able to voice and demonstrate understanding of unit orientation instructions.    Will continue to evaluate and treat per MD orders.  Delman Cheadle

## 2013-07-25 NOTE — Consult Note (Signed)
Patient Maria Christian      DOB: March 30, 1925      DDU:[id number]     Consult Note from the Palliative Medicine Team at [facility] Requested by: Dr Thereasa Solo     PCP: Ezequiel Kayser, MD Reason for Consultation:Clarification of [place] and options     Phone [phone]  Assessment of patients Current state:  Overall failure to thrive, continued poor po intake with weight loss, infections, skin breakdown, delirium.  Patients daughter tell me she has been declining physically, functionally and cognitively for many months.  She has been a resident at a [facility] SNF for about 3 years   Consult is for review of medical treatment options, clarification of goals of care and end of life issues, disposition and options, and symptom recommendation.  This NP Wadie Lessen reviewed medical records, received report from team, assessed the patient and then meet at the patient's bedside along with her two daughters [name] # [phone], [name] # [phone] and her son by telephone [name] # [phone]  to discuss diagnosis prognosis, [place], EOL wishes disposition and options.   A detailed discussion was had today regarding advanced directives.  Concepts specific to code status, artifical feeding and hydration, continued IV antibiotics and rehospitalization was had.  The difference between a aggressive medical intervention path  and a palliative comfort care path for this patient at this time was had.  Values and goals of care important to patient and family were attempted to be elicited.  Concept of Hospice and Palliative Care were discussed  Natural trajectory and expectations at EOL were discussed.  Questions and concerns addressed.  Hard Choices booklet left for review. Family encouraged to call with questions or concerns.  PMT will continue to support holistically.  Family is faced with advanced directive and anticipatory care decesions   The son [name] in  [place] was easily agitated when discussion of a more comfort care approach was approached.  He tells me his mother does not have dementia and that all her medical issues are related to poor care delivery at the SNF and continued use of antibiotics.  He would like antibiotics administered in half the recommended dose.  Education was offered    Goals of Care: 1.  Code Status: DNR/DNI   2. Scope of Treatment: 1.  At this time family is open to all available and offered medically interventions to prolong life.  4. Disposition: When medically stable discharge back to SNF   3. Symptom Management:   1.  Weakness/Failure to thrive: continued medical management of treatable illness  4. Psychosocial:  Emotional support to all family members.  There is clearly friction between the brother in [place]) and other family members.  He claims to be HPOA but others are unsure of this.  Family to try to secure documents.  Three sibling wish to focus mor on comfort but one brother wishes for all available and offered medical interventions to prolong life    Patient Documents Completed or Given: Document Given Completed  Advanced Directives Pkt    MOST yes   DNR    Gone from My Sight    Hard Choices yes     Brief HPI:80 year old female with multiple medical problems. Last admitted in December for issues related to sigmoid volvulus. Returns to the ER after being found hypotensive with one episode of nausea with vomiting. According to the patient's daughter she has not been eating well for several weeks.  She also been complaining of some right upper abdominal pain and was given aspirin at the nursing facility. In the ER x-ray films questioned another volvulus. Urinalysis was consistent with UTI. She also was found to be in acute renal failure. She did receive IV fluids for her hypotension with improvement in blood pressure. Orders were to admit to the step down unit but because there were no beds  available at [facility] the patient was transferred to [facility].   Overall failure to thrive, continued poor po intake with weight loss, infections, skin breakdown, delirium.  Patients daughter tell me she has been declining physically, functionally and cognitively for many months.      ROS: unable to illicit    PMH:  Past Medical History  Diagnosis Date  . Atrial fibrillation   . Altered mental status   . Encephalopathy   . Parkinson disease   . Hypertension   . GERD (gastroesophageal reflux disease)   . Hyperlipemia   . Diabetes mellitus   . Coronary artery disease   . History of adenomatous polyp of colon 06/24/99  . Enlarged heart   . DEMENTIA   . Edema of lower extremity 07/13/11    right leg more swollen than left leg  . Esophageal dysmotility 07/02/12  . Horseshoe kidney   . Pancreatic lesion 05/22/11    no further workup  per PCP/family due to age  . Anemia   . Peripheral neuropathy   . Depression   . Thrombophlebitis      PSH: Past Surgical History  Procedure Laterality Date  . Shoulder surgery  2001    left clavicle excision and acromioplasty  . Abdominal hysterectomy    . Cholecystectomy    . Total hip arthroplasty    . Breast surgery      2 benign tumors removed left breast  . Foot surgery      benign tumors from foot  . Esophageal dilation      several times by Dr. [name]  . Nose surgery    . Esophagogastroduodenoscopy (egd) with esophageal dilation N/A 08/01/2012    Procedure: ESOPHAGOGASTRODUODENOSCOPY (EGD) WITH ESOPHAGEAL DILATION;  Surgeon: [name], MD;  [facility];  Service: Endoscopy;  Laterality: N/A;  with c-arm savory dilators  . Flexible sigmoidoscopy N/A 05/24/2013    Procedure: FLEXIBLE SIGMOIDOSCOPY;  Surgeon: [name], MD;  [facility];  Service: Endoscopy;  Laterality: N/A;  . Flexible sigmoidoscopy N/A 05/26/2013    Procedure:  flex with decompression of sigmoid volvulus;   Surgeon: [name], MD;  [facility];  Service: Endoscopy;  Laterality: N/A;   I have reviewed the [place] and SH and  If appropriate update it with new information. Allergies  Allergen Reactions  . Aspirin Other (See Comments)    G.I. Upset only   Scheduled Meds: . ampicillin-sulbactam (UNASYN) IV  3 g Intravenous Q12H  . collagenase   Topical Daily  . insulin aspart  0-9 Units Subcutaneous TID WC  . latanoprost  1 drop Both Eyes QHS  . sodium chloride  3 mL Intravenous Q12H   Continuous Infusions: . sodium chloride 50 mL/hr at 07/25/13 1104   PRN Meds:.acetaminophen, acetaminophen, ondansetron (ZOFRAN) IV, ondansetron    BP 145/81  Pulse 86  Temp(Src) 99 F (37.2 C) (Oral)  Resp 21  Ht 5\' 5"  (1.651 m)  Wt 74.3 kg (163 lb 12.8 oz)  BMI 27.26 kg/m2  SpO2 93%   PPS:20 %  Intake/Output Summary (Last 24 hours) at 07/25/13 1617 Last data filed at 07/25/13 1100  Gross per 24 hour  Intake 2058.5 ml  Output      1 ml  Net 2057.5 ml    Physical Exam:  General: chronically ill appearing, NAD HEENT:  Dry membranes, No exudate, no denation Chest:   Decreased in bases, CVS: RRR Abdomen:soft NT +BS Ext: without edema Neuro: confused and unable to follow commands  Labs: CBC    Component Value Date/Time   WBC 5.1 07/25/2013 0239   RBC 2.56* 07/25/2013 0239   HGB 8.6* 07/25/2013 0239   HCT 26.7* 07/25/2013 0239   PLT 113* 07/25/2013 0239   MCV 104.3* 07/25/2013 0239   MCH 33.6 07/25/2013 0239   MCHC 32.2 07/25/2013 0239   RDW 15.1 07/25/2013 0239   LYMPHSABS 1.2 07/24/2013 2028   MONOABS 0.5 07/24/2013 2028   EOSABS 0.2 07/24/2013 2028   BASOSABS 0.0 07/24/2013 2028    BMET    Component Value Date/Time   NA 142 07/25/2013 0239   K 5.0 07/25/2013 0239   CL 114* 07/25/2013 0239   CO2 17* 07/25/2013 0239   GLUCOSE 123* 07/25/2013 0239   BUN 30* 07/25/2013 0239   CREATININE 1.45* 07/25/2013 0239   CALCIUM 8.2* 07/25/2013 0239   GFRNONAA 32* 07/25/2013 0239    GFRAA 37* 07/25/2013 0239    CMP     Component Value Date/Time   NA 142 07/25/2013 0239   K 5.0 07/25/2013 0239   CL 114* 07/25/2013 0239   CO2 17* 07/25/2013 0239   GLUCOSE 123* 07/25/2013 0239   BUN 30* 07/25/2013 0239   CREATININE 1.45* 07/25/2013 0239   CALCIUM 8.2* 07/25/2013 0239   PROT 6.0 07/24/2013 2028   ALBUMIN 3.0* 07/24/2013 2028   AST 45* 07/24/2013 2028   ALT 37* 07/24/2013 2028   ALKPHOS 61 07/24/2013 2028   BILITOT <0.2* 07/24/2013 2028   GFRNONAA 32* 07/25/2013 0239   GFRAA 37* 07/25/2013 0239     Time In Time Out Total Time Spent with Patient Total Overall Time  1030 1230 110 min 120 min    Greater than 50%  of this time was spent counseling and coordinating care related to the above assessment and plan.  Wadie Lessen NP  Palliative Medicine Team Team Phone # [phone] Pager [phone]

## 2013-07-25 NOTE — Progress Notes (Signed)
Patient ID: Maria Christian, female   DOB: [birth year], 80 y.o.   MRN: [id number]    Subjective: Pt feels ok this morning.  Denies abdominal pain, nausea.  Objective: Vital signs in last 24 hours: Temp:  [97.5 F (36.4 C)-98.9 F (37.2 C)] 98.9 F (37.2 C) (02/20 0800) Pulse Rate:  [30-100] 77 (02/20 0800) Resp:  [11-18] 18 (02/20 0800) BP: (79-146)/(37-121) 122/94 mmHg (02/20 0800) SpO2:  [84 %-100 %] 95 % (02/20 0800) Weight:  [163 lb 12.8 oz (74.3 kg)] 163 lb 12.8 oz (74.3 kg) (02/19 1715) Last BM Date: 07/24/13  Intake/Output from previous day:   Intake/Output this shift: Total I/O In: 1815.5 [I.V.:1812.5; Other:3] Out: -   PE: Abd: soft, +BS, ND, minimal right sided tenderness, but no LLQ or suprapubic tenderness  Lab Results:   Recent Labs  07/24/13 2028 07/25/13 0239  WBC 5.0 5.1  HGB 8.9* 8.6*  HCT 27.0* 26.7*  PLT 111* 113*   BMET  Recent Labs  07/24/13 2028 07/25/13 0239  NA 143 142  K 4.8 5.0  CL 114* 114*  CO2 16* 17*  GLUCOSE 106* 123*  BUN 33* 30*  CREATININE 1.62* 1.45*  CALCIUM 8.1* 8.2*   PT/INR No results found for this basename: LABPROT, INR,  in the last 72 hours CMP     Component Value Date/Time   NA 142 07/25/2013 0239   K 5.0 07/25/2013 0239   CL 114* 07/25/2013 0239   CO2 17* 07/25/2013 0239   GLUCOSE 123* 07/25/2013 0239   BUN 30* 07/25/2013 0239   CREATININE 1.45* 07/25/2013 0239   CALCIUM 8.2* 07/25/2013 0239   PROT 6.0 07/24/2013 2028   ALBUMIN 3.0* 07/24/2013 2028   AST 45* 07/24/2013 2028   ALT 37* 07/24/2013 2028   ALKPHOS 61 07/24/2013 2028   BILITOT <0.2* 07/24/2013 2028   GFRNONAA 32* 07/25/2013 0239   GFRAA 37* 07/25/2013 0239   Lipase     Component Value Date/Time   LIPASE 31 07/24/2013 0422       Studies/Results: Ct Abdomen Pelvis Wo Contrast  07/24/2013   CLINICAL DATA:  Abdominal pain nausea and vomiting  EXAM: CT ABDOMEN AND PELVIS WITHOUT CONTRAST  TECHNIQUE: Multidetector CT imaging of the abdomen and pelvis  was performed following the standard protocol without intravenous contrast.  COMPARISON:  DG ABD ACUTE W/CHEST dated 07/24/2013; CT ABD/PELVIS W CM dated 05/23/2013  FINDINGS: The rectosigmoid colon is decompressed with mild wall thickening measuring 5 mm but no surrounding stranding or dilatation. Small bowel caliber is normal. Streak artifact from left total hip arthroplasty obscures detail of the pelvis. No free air or fluid. No lymphadenopathy.  Trace bilateral pleural effusions with associated compressive atelectasis noted. Cardiomegaly is present.  Cholecystectomy clips are noted. Unenhanced liver, adrenal glands, spleen, and pancreas are normal. Horseshoe kidney configuration reidentified with vascular calcifications and vascular calcification or nonobstructing left renal moiety 3 mm calculus image 41.  Moderate to severe atheromatous aortic calcification without aneurysm. The bladder is mildly thick walled, 5 mm. No radiopaque ureteral or bladder calculus is identified. The patient is rotated. Multilevel disc degenerative changes identified but no acute osseous finding is present. No compression deformity.  IMPRESSION: Rectosigmoid mild bowel wall thickening to 5 mm without surrounding stranding, which may be at upper limits of normal given the degree of collapse. However, early colitis could have this appearance. Bowel ischemia is possible. No evidence for bowel obstruction.  Mild bladder wall thickening, correlate with urinalysis for evidence of urinary  tract infection.   Electronically Signed   By: Conchita Paris M.D.   On: 07/24/2013 09:29   Dg Abd Acute W/chest  07/24/2013   CLINICAL DATA:  Vomiting, abdominal pain.  EXAM: ACUTE ABDOMEN SERIES (ABDOMEN 2 VIEW & CHEST 1 VIEW)  COMPARISON:  05/29/2013 and multiple other Plain films. CT 05/23/2013.  FINDINGS: Cardiomegaly. Lungs are clear. No effusions or acute bony abnormality.  Diffuse gaseous distention of the colon, dilated sigmoid colon. Cannot  exclude recurrent sigmoid volvulus. No free air. No organomegaly. Prior cholecystectomy.  IMPRESSION: Diffuse gaseous distention of the colon with most pronounced gaseous distention in the sigmoid colon. Cannot exclude recurrent sigmoid volvulus.   Electronically Signed   By: Rolm Baptise M.D.   On: 07/24/2013 04:23    Anti-infectives: Anti-infectives   Start     Dose/Rate Route Frequency Ordered Stop   07/25/13 0800  vancomycin (VANCOCIN) IVPB 750 mg/150 ml premix  Status:  Discontinued     750 mg 150 mL/hr over 60 Minutes Intravenous Every 24 hours 07/24/13 0652 07/24/13 1021   07/24/13 2200  Ampicillin-Sulbactam (UNASYN) 3 g in sodium chloride 0.9 % 100 mL IVPB     3 g 100 mL/hr over 60 Minutes Intravenous Every 12 hours 07/24/13 1059     07/24/13 1400  piperacillin-tazobactam (ZOSYN) IVPB 3.375 g  Status:  Discontinued     3.375 g 12.5 mL/hr over 240 Minutes Intravenous 3 times per day 07/24/13 0652 07/24/13 1021   07/24/13 1045  Ampicillin-Sulbactam (UNASYN) 3 g in sodium chloride 0.9 % 100 mL IVPB     3 g 100 mL/hr over 60 Minutes Intravenous STAT 07/24/13 1037 07/24/13 1223   07/24/13 0700  vancomycin (VANCOCIN) IVPB 1000 mg/200 mL premix  Status:  Discontinued     1,000 mg 200 mL/hr over 60 Minutes Intravenous  Once 07/24/13 0646 07/24/13 1037   07/24/13 0700  piperacillin-tazobactam (ZOSYN) IVPB 3.375 g  Status:  Discontinued     3.375 g 100 mL/hr over 30 Minutes Intravenous  Once 07/24/13 0646 07/24/13 1037   07/24/13 0430  ciprofloxacin (CIPRO) IVPB 400 mg     400 mg 200 mL/hr over 60 Minutes Intravenous  Once 07/24/13 0420 07/24/13 0553       Assessment/Plan  1. Possible colitis Patient Active Problem List   Diagnosis Date Noted  . Hypotension 07/24/2013  . Vomiting 07/24/2013  . ARF (acute renal failure) 07/24/2013  . Type I (juvenile type) diabetes mellitus with peripheral circulatory disorders, not stated as uncontrolled 06/07/2013  . Volvulus of sigmoid colon  05/26/2013  . Pain in right shoulder joint 05/25/2013  . Sigmoid volvulus 05/24/2013  . Pulmonary hypertension 05/24/2013  . Sacral decubitus ulcer 05/24/2013  . HOH (hard of hearing) 05/24/2013  . Atrial fibrillation 01/30/2013  . TIA (transient ischemic attack) 12/19/2012  . Dementia 12/19/2012  . Insomnia 12/19/2012  . Depression 12/19/2012  . Constipation 12/19/2012  . Type II or unspecified type diabetes mellitus without mention of complication, not stated as uncontrolled 10/26/2012  . Hypokalemia 07/17/2011  . Shoulder pain 07/17/2011  . Pancreatic mass 07/17/2011  . Dysphagia 07/17/2011  . Thrombocytopenia 07/16/2011  . UTI (lower urinary tract infection) 07/13/2011  . Pneumonia 07/13/2011  . History of adenomatous polyp of colon 07/02/2011  . Preventative health care 07/01/2011  . Parkinson disease   . Hypertension   . GERD (gastroesophageal reflux disease)   . Hyperlipemia   . Diabetes mellitus   . Coronary artery disease   . Atrial  fibrillation with controlled ventricular response 05/24/2011  . Diastolic CHF, chronic 123456  . Palpitations 05/24/2011  . Pyuria 05/24/2011   Plan: 1. Patient has minimal right sided abdominal tenderness, but otherwise an benign abdominal exam.  Will give clear liquids today.  Do not see any need for surgical intervention at this time.   LOS: 1 day    Bryla Burek E 07/25/2013, 9:05 AM Pager: [phone]

## 2013-07-25 NOTE — Consult Note (Signed)
I saw the patient, participated in the history, exam and medical decision making, and concur with the physician assistant's note above.  Devlynn Knoff M. Dalisa Forrer, MD, FACS General, Bariatric, & Minimally Invasive Surgery [facility] [place]   

## 2013-07-25 NOTE — Progress Notes (Signed)
[facility]  DILA HOPWOOD [id number] DOB: 04/02/1925 DOA: 07/24/2013 PCP: Ezequiel Kayser, MD  Brief narrative: 80 year old female with multiple medical problems. Last admitted in December for issues related to sigmoid volvulus. Returned to the ER after being found hypotensive with one episode of nausea with vomiting. According to the patient's daughter she had not been eating well for several weeks. She had also been complaining of some right upper abdominal pain and was given aspirin at the nursing facility. In the ER x-ray films questioned another volvulus. Urinalysis was consistent with UTI. She also was found to be in acute renal failure. She did receive IV fluids for her hypotension with improvement in blood pressure. Orders were to admit to the step down unit but because there were no beds available at [facility] the patient was transferred to [facility].  Assessment/Plan:  E. coli UTI -Followup on culture -narrow abx to Rocephin and f/u on sensitivities  -Suspect cause  of nausea vomiting and abdominal pain  Hypotension -Has resolved after hydration and is actually now hypertensive -Decrease IV fluids to 50 cc an hour and plan to soon transition to saline lock if patient proves can tolerate oral intake  ARF (acute renal failure) -Improving after hydration -Could have a degree of ATN since was on Prinivil and Lasix pre-admission in setting of hypotension  -Was also taking NSAIDs when necessary and given patient's advanced age and GFR would not resume NSAIDs at discharge  Dehydration -Appears resolved noting hypertensive blood pressure -IV fluids decreased as above  Diastolic CHF, chronic -Compensated -Noted on ACE inhibitor with Lasix prior to admission - need to discontinue these medications given patient's advanced age and failure to thrive symptoms  Hypertension -Follow for another 24 hours and likely can  resume an antihypertensive agent at that time  Diabetes mellitus -On metformin prior to admission and given advanced age and poor renal function would not resume -Continue sliding scale insulin -CBGs well controlled  Volvulus of sigmoid colon -Appreciate surgical eval  -Although has sigmoid wall thickening of unclear etiology on diagnostic evaluation surgery does not think patient has recurrent volvulus and plans on advancing diet as tolerated  Atrial fibrillation with controlled ventricular response -Currently rate controlled  Parkinson disease/Dementia -Palliative evaluation for goals of care today  DVT prophylaxis: SCDs Code Status: DO NOT RESUSCITATE Family Communication: Daughter at bedside Disposition Plan/Expected LOS: Transfer to floor  Consultants: Palliative medicine Gen. Surgery  Procedures: None  Antibiotics: Unasyn 2/19 >> 2/20 Rocephin 2/20 >>  HPI/Subjective: Patient alert and confused without any specific complaints.  Objective: Blood pressure 145/81, pulse 86, temperature 99 F (37.2 C), temperature source Oral, resp. rate 21, height 5\' 5"  (1.651 m), weight 163 lb 12.8 oz (74.3 kg), SpO2 93.00%.  Intake/Output Summary (Last 24 hours) at 07/25/13 1441 Last data filed at 07/25/13 1100  Gross per 24 hour  Intake 2058.5 ml  Output      1 ml  Net 2057.5 ml   Exam: General: No acute respiratory distress Lungs: Clear to auscultation bilaterally without wheezes or crackles, RA Cardiovascular: Irregular rate and rhythm without murmur gallop or rub normal S1 and S2, no peripheral edema or JVD Abdomen: Nontender, nondistended, soft, bowel sounds positive, no rebound, no ascites, no appreciable mass Musculoskeletal: No significant cyanosis, clubbing of bilateral lower extremities Neurological: Alert, moves all extremities x 4 without focal neurological deficits, CN 2-12 intact  Scheduled Meds:  Scheduled Meds: . ampicillin-sulbactam (  UNASYN) IV  3 g  Intravenous Q12H  . collagenase   Topical Daily  . insulin aspart  0-9 Units Subcutaneous TID WC  . latanoprost  1 drop Both Eyes QHS  . sodium chloride  3 mL Intravenous Q12H    Data Reviewed: Basic Metabolic Panel:  Recent Labs Lab 07/24/13 0422 07/24/13 2028 07/25/13 0239  NA 138 143 142  K 4.6 4.8 5.0  CL 103 114* 114*  CO2 19 16* 17*  GLUCOSE 164* 106* 123*  BUN 40* 33* 30*  CREATININE 1.97* 1.62* 1.45*  CALCIUM 9.3 8.1* 8.2*   Liver Function Tests:  Recent Labs Lab 07/24/13 0422 07/24/13 2028  AST 14 45*  ALT 8 37*  ALKPHOS 75 61  BILITOT <0.2* <0.2*  PROT 6.8 6.0  ALBUMIN 3.6 3.0*    Recent Labs Lab 07/24/13 0422  LIPASE 31   CBC:  Recent Labs Lab 07/24/13 0422 07/24/13 2028 07/25/13 0239  WBC 7.6 5.0 5.1  NEUTROABS 6.0 3.1  --   HGB 9.0* 8.9* 8.6*  HCT 27.9* 27.0* 26.7*  MCV 104.5* 104.2* 104.3*  PLT 118* 111* 113*   Cardiac Enzymes:  Recent Labs Lab 07/24/13 0710 07/24/13 2028  TROPONINI <0.30 <0.30   BNP (last 3 results)  Recent Labs  05/24/13 0250  PROBNP 3915.0*   CBG:  Recent Labs Lab 07/24/13 1803 07/24/13 2128 07/24/13 2339 07/25/13 0403  GLUCAP 93 101* 114* 115*    Recent Results (from the past 240 hour(s))  URINE CULTURE     Status: None   Collection Time    07/24/13  3:54 AM      Result Value Ref Range Status   Specimen Description URINE, CATHETERIZED   Final   Special Requests NONE   Final   Culture  Setup Time     Final   Value: 07/24/2013 08:33     Performed at [facility]     Final   Value: >=100,000 COLONIES/ML     Performed at [facility]   Culture     Final   Value: ESCHERICHIA COLI     Performed at [facility]   Report Status PENDING   Incomplete  CULTURE, BLOOD (ROUTINE X 2)     Status: None   Collection Time    07/24/13  8:40 AM      Result Value Ref Range Status   Specimen Description BLOOD LEFT HAND   Final   Special Requests BOTTLES DRAWN  AEROBIC AND ANAEROBIC 3CC   Final   Culture  Setup Time     Final   Value: 07/24/2013 09:57     Performed at [facility]   Culture     Final   Value:        BLOOD CULTURE RECEIVED NO GROWTH TO DATE CULTURE WILL BE HELD FOR 5 DAYS BEFORE ISSUING A FINAL NEGATIVE REPORT     Performed at [facility]   Report Status PENDING   Incomplete  MRSA PCR SCREENING     Status: None   Collection Time    07/24/13  5:29 PM      Result Value Ref Range Status   MRSA by PCR NEGATIVE  NEGATIVE Final   Comment:            The GeneXpert MRSA Assay (FDA     approved for NASAL specimens     only), is one component of a     comprehensive  MRSA colonization     surveillance program. It is not     intended to diagnose MRSA     infection nor to guide or     monitor treatment for     MRSA infections.     Studies:  Recent x-ray studies have been reviewed in detail by the Attending Physician  Time spent : 4mins     Allison Ellis, ANP [facility] Office  [phone] Pager [phone]  **If unable to reach the above provider after paging please contact the [facility] @ [phone]  On-Call/Text Page:      [email or web address]      password [email or web address]  If 7PM-7AM, please contact night-coverage [email or web address] Password [email or web address] 07/25/2013, 2:41 PM   LOS: 1 day   I have personally examined this patient and reviewed the entire database. I have reviewed the above note, made any necessary editorial changes, and agree with its content.  Cherene Altes, MD [facility]

## 2013-07-25 NOTE — Progress Notes (Signed)
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 Subtle ttp with deep palpation.   Sigmoid wall thickening of unclear etiology i dont' think she has recurrent volvulus.  Agree with [facility] consult. Adv diet as tolerated.   Leighton Ruff. Redmond Pulling, MD, FACS General, Bariatric, & Minimally Invasive Surgery [facility], [place]

## 2013-07-26 ENCOUNTER — Inpatient Hospital Stay (HOSPITAL_COMMUNITY): Payer: PRIVATE HEALTH INSURANCE

## 2013-07-26 DIAGNOSIS — A498 Other bacterial infections of unspecified site: Secondary | ICD-10-CM

## 2013-07-26 DIAGNOSIS — I4891 Unspecified atrial fibrillation: Secondary | ICD-10-CM

## 2013-07-26 LAB — BASIC METABOLIC PANEL
BUN: 16 mg/dL (ref 6–23)
CHLORIDE: 113 meq/L — AB (ref 96–112)
CO2: 17 meq/L — AB (ref 19–32)
Calcium: 8.9 mg/dL (ref 8.4–10.5)
Creatinine, Ser: 0.87 mg/dL (ref 0.50–1.10)
GFR calc Af Amer: 68 mL/min — ABNORMAL LOW (ref >90)
GFR calc non Af Amer: 59 mL/min — ABNORMAL LOW (ref >90)
Glucose, Bld: 164 mg/dL — ABNORMAL HIGH (ref 70–99)
POTASSIUM: 4 meq/L (ref 3.7–5.3)
SODIUM: 143 meq/L (ref 137–147)

## 2013-07-26 LAB — HEPATIC FUNCTION PANEL
ALBUMIN: 3.3 g/dL — AB (ref 3.5–5.2)
ALK PHOS: 73 U/L (ref 39–117)
ALT: 29 U/L (ref 0–35)
AST: 31 U/L (ref 0–37)
Bilirubin, Direct: <0.2 mg/dL (ref 0.0–0.3)
TOTAL PROTEIN: 6.7 g/dL (ref 6.0–8.3)
Total Bilirubin: 0.3 mg/dL (ref 0.3–1.2)

## 2013-07-26 LAB — CBC
HCT: 27.9 % — ABNORMAL LOW (ref 36.0–46.0)
Hemoglobin: 9 g/dL — ABNORMAL LOW (ref 12.0–15.0)
MCH: 33.1 pg (ref 26.0–34.0)
MCHC: 32.3 g/dL (ref 30.0–36.0)
MCV: 102.6 fL — ABNORMAL HIGH (ref 78.0–100.0)
PLATELETS: 115 10*3/uL — AB (ref 150–400)
RBC: 2.72 MIL/uL — AB (ref 3.87–5.11)
RDW: 15.1 % (ref 11.5–15.5)
WBC: 6.2 10*3/uL (ref 4.0–10.5)

## 2013-07-26 LAB — GLUCOSE, CAPILLARY
GLUCOSE-CAPILLARY: 133 mg/dL — AB (ref 70–99)
GLUCOSE-CAPILLARY: 195 mg/dL — AB (ref 70–99)
GLUCOSE-CAPILLARY: 135 mg/dL — AB (ref 70–99)
Glucose-Capillary: 154 mg/dL — ABNORMAL HIGH (ref 70–99)

## 2013-07-26 NOTE — Evaluation (Signed)
Physical Therapy Evaluation Patient Details Name: Maria Christian MRN: [id number] DOB: 08-28-1924 Today's Date: 07/26/2013 Time: 2831-5176 PT Time Calculation (min): 30 min  PT Assessment / Plan / Recommendation History of Present Illness  Pt admit with UTI.    Clinical Impression  Pt admitted with above. Pt currently with functional limitations due to the deficits listed below (see PT Problem List). Pt will benefit from PT in hospital so that she can attain and maintain her level of assist at [facility] which was using Stedy.  Pt will benefit from skilled PT to increase their independence and safety with mobility to allow discharge to the venue listed below.     PT Assessment  Patient needs continued PT services    Follow Up Recommendations  SNF;Supervision/Assistance - 24 hour    Does the patient have the potential to tolerate intense rehabilitation      Barriers to Discharge Decreased caregiver support      Equipment Recommendations  None recommended by PT    Recommendations for Other Services     Frequency Min 2X/week    Precautions / Restrictions Precautions Precautions: Fall Restrictions Weight Bearing Restrictions: No   Pertinent Vitals/Pain VSS, No pain      Mobility  Bed Mobility Overal bed mobility: Needs Assistance Bed Mobility: Rolling;Supine to Sit Rolling: Mod assist Supine to sit: Mod assist General bed mobility comments: Needs assist to LEs and elevation of trunk.Pt was soaked with urine and asked for bedpan.  Cleaned pt, changed gown, changed linens as well and got pt out of wet bed.   Transfers Overall transfer level: Needs assistance Transfers: Squat Pivot Transfers Squat pivot transfers: Mod assist General transfer comment: Pt requires bil LEs blocked and used safety belt and pt is able to fully weight bear to make transfer to chair fairly easy.      Exercises     PT Diagnosis: Generalized weakness  PT Problem List: Decreased activity  tolerance;Decreased balance;Decreased knowledge of use of DME;Decreased safety awareness;Decreased knowledge of precautions;Decreased mobility PT Treatment Interventions: DME instruction;Gait training;Functional mobility training;Therapeutic activities;Therapeutic exercise;Balance training;Patient/family education     PT Goals(Current goals can be found in the care plan section) Acute Rehab PT Goals Patient Stated Goal: to go back to [facility] PT Goal Formulation: With patient Time For Goal Achievement: 08/02/13 Potential to Achieve Goals: Good  Visit Information  Last PT Received On: 07/26/13 Assistance Needed: +2 History of Present Illness: Pt admit with UTI.         Prior Functioning  Home Living Family/patient expects to be discharged to:: Skilled nursing facility Additional Comments: [facility] Prior Function Level of Independence: Needs assistance Gait / Transfers Assistance Needed: Transferred with 1 person A.  Propels w/c with B UE.  Is non-ambulatory.Sounds like pt may have used Stedy to transfer.   ADL's / Homemaking Assistance Needed: min to mod assist Communication Communication: HOH    Cognition  Cognition Arousal/Alertness: Awake/alert Behavior During Therapy: Anxious Overall Cognitive Status: History of cognitive impairments - at baseline    Extremity/Trunk Assessment Upper Extremity Assessment Upper Extremity Assessment: Defer to OT evaluation Lower Extremity Assessment Lower Extremity Assessment: Generalized weakness   Balance Balance Overall balance assessment: Needs assistance;History of Falls Sitting-balance support: No upper extremity supported;Feet supported Sitting balance-Leahy Scale: Good Postural control: Posterior lean Standing balance support: Bilateral upper extremity supported;During functional activity Standing balance-Leahy Scale: Poor Standing balance comment: Has posterior lean with standing needing assist for anterior lean.    General Comments General comments (  skin integrity, edema, etc.): Has sacral dressing on. Noted once in chair and raised LEs up pt c/o tight hamstrings.    End of Session PT - End of Session Equipment Utilized During Treatment: Gait belt Activity Tolerance: Patient limited by fatigue Patient left: in chair;with call bell/phone within reach Nurse Communication: Mobility status;Need for lift equipment (used Stedy at [facility] and notified nursing to please use Stedy here)  GP     INGOLD,Giulianna Rocha 07/26/2013, 3:18 PM [facility] Acute Rehabilitation [phone] (pager)

## 2013-07-26 NOTE — Progress Notes (Signed)
PATIENT DETAILS Name: Maria Christian Age: 80 y.o. Sex: female Date of Birth: 1924/06/30 Admit Date: 07/24/2013 Admitting Physician Debbe Odea, MD [id number], MD  Brief narrative:  80 year old female with multiple medical problems including CHF, atrial fibrillation, diabetes, history of volvulus. Last admitted in December for issues related to sigmoid volvulus. Returned to the ER on 2/19 after being found hypotensive with one episode of nausea with vomiting. On further evaluation, she was found to have a UTI, in the emergency room, x-rays claims questioned whether she had another volvulus. Patient was then admitted for further evaluation and treatment. Initially she was admitted to the step down unit, after improvement, she was then transferred. General surgery has evaluated and they don't think that the patient has any active volvulus at this time.  Subjective: Apart from chronic neck pain, no major complaints this morning.  Assessment/Plan: E. coli UTI  - On admission was on vancomycin and Zosyn, antibiotics have now been narrowed to Rocephin-day 2,  sensitivities pending. Blood culture drawn on 2/19 negative. -Suspect cause of nausea vomiting and abdominal pain   Hypotension - Suspect this was secondary to hypovolemia secondary to dehydration. This was seen on admission, and has resolved with IV hydration. - Blood pressure now is on the higher side, we'll stop all IV fluids. Coreg has been restarted.  Acute renal failure - Likely prerenal from dehydration - Resolved with hydration  Dehydration - Secondary to poor oral intake and UTI. - Resolved with hydration  Hx Volvulus of sigmoid colon  -Appreciate surgical eval  -Although has sigmoid wall thickening of unclear etiology on diagnostic evaluation surgery does not think patient has recurrent volvulus and plans on advancing diet as tolerated  Chronic Thrombocytopenia - Slight worsening likely secondary to to  acute illness/UTI. Review of prior labs do show chronic mild thrombocytopenia. - Currently very mild, continue to monitor.  Diastolic CHF, chronic  -Compensated  -Noted on ACE inhibitor with Lasix prior to admission - will need to be very careful with these medications given patient's advanced age, failure to thrive symptoms and tendency to get dehydrated with diuretic therapy resulting in renal failure. For now continue to hold his medications. - Already on Coreg, continue.   Hypertension - Moderately Controlled, continue with Coreg, slowly resume amlodipine as blood pressure permits.  Diabetes - Last A1c in December 2014 was 6.4. - Given advanced age, allow some permissive hyper glycemia to prevent life-threatening hypoglycemic episodes. - Continue cautiously with SSI on inpatient. Was on metformin prior to admission, this was placed on hold secondary to renal failure. Suspect can be discontinued on discharge and can be just watched.  History of cerebrovascular disease - Reviewed prior neurology notes-apparently has a history of silent cerebrovascular disease on MRI of brain. - Continue Plavix-for secondary stroke prevention  History of dementia - Stable, continue with Aricept.  History of Parkinson's disease - Stable- apparently not on any anti-Parkinson's treatment  History of depression - Continue with Zoloft.  History of paroxysmal atrial fibrillation. - Continue Coreg. - Suspect not a anticoagulation candidate given failure to thrive syndrome, fall risk. Continue antiplatelet agents   Failure to thrive syndrome - Appreciate palliative care consultation - Nutritional eval, physical therapy eval - Suspect back to SNF in the next few days  Disposition: Remain inpatient  DVT Prophylaxis:  SCD's given thrombocytopenia  Code Status:  DNR  Family Communication None at bedside  Procedures:  None  CONSULTS:  general surgery and Palliative  care  MEDICATIONS: Scheduled Meds: . carvedilol  3.125 mg Oral Daily  . cefTRIAXone (ROCEPHIN)  IV  1 g Intravenous Q24H  . clopidogrel  75 mg Oral Daily  . collagenase   Topical Daily  . donepezil  5 mg Oral QHS  . dorzolamide  1 drop Both Eyes TID  . feeding supplement (ENSURE COMPLETE)  237 mL Oral BID BM  . insulin aspart  0-9 Units Subcutaneous TID WC  . ketotifen  1 drop Both Eyes BID  . lactulose  30 g Oral BID  . latanoprost  1 drop Both Eyes QHS  . polyethylene glycol  17 g Oral Daily  . sertraline  50 mg Oral QHS  . sodium chloride  3 mL Intravenous Q12H   Continuous Infusions: . sodium chloride 50 mL/hr at 07/25/13 1827   PRN Meds:.acetaminophen, acetaminophen, ALPRAZolam, ALPRAZolam, ondansetron (ZOFRAN) IV, ondansetron  Antibiotics: Anti-infectives   Start     Dose/Rate Route Frequency Ordered Stop   07/25/13 1845  cefTRIAXone (ROCEPHIN) 1 g in dextrose 5 % 50 mL IVPB     1 g 100 mL/hr over 30 Minutes Intravenous Every 24 hours 07/25/13 1807     07/25/13 0800  vancomycin (VANCOCIN) IVPB 750 mg/150 ml premix  Status:  Discontinued     750 mg 150 mL/hr over 60 Minutes Intravenous Every 24 hours 07/24/13 0652 07/24/13 1021   07/24/13 2200  Ampicillin-Sulbactam (UNASYN) 3 g in sodium chloride 0.9 % 100 mL IVPB  Status:  Discontinued     3 g 100 mL/hr over 60 Minutes Intravenous Every 12 hours 07/24/13 1059 07/25/13 1807   07/24/13 1400  piperacillin-tazobactam (ZOSYN) IVPB 3.375 g  Status:  Discontinued     3.375 g 12.5 mL/hr over 240 Minutes Intravenous 3 times per day 07/24/13 0652 07/24/13 1021   07/24/13 1045  Ampicillin-Sulbactam (UNASYN) 3 g in sodium chloride 0.9 % 100 mL IVPB     3 g 100 mL/hr over 60 Minutes Intravenous STAT 07/24/13 1037 07/24/13 1223   07/24/13 0700  vancomycin (VANCOCIN) IVPB 1000 mg/200 mL premix  Status:  Discontinued     1,000 mg 200 mL/hr over 60 Minutes Intravenous  Once 07/24/13 0646 07/24/13 1037   07/24/13 0700   piperacillin-tazobactam (ZOSYN) IVPB 3.375 g  Status:  Discontinued     3.375 g 100 mL/hr over 30 Minutes Intravenous  Once 07/24/13 0646 07/24/13 1037   07/24/13 0430  ciprofloxacin (CIPRO) IVPB 400 mg     400 mg 200 mL/hr over 60 Minutes Intravenous  Once 07/24/13 0420 07/24/13 0553       PHYSICAL EXAM: Vital signs in last 24 hours: Filed Vitals:   07/25/13 1810 07/25/13 1947 07/25/13 2039 07/26/13 0452  BP: 146/80 137/80 145/71 147/74  Pulse: 80 73 85 86  Temp: 99.1 F (37.3 C)  98.3 F (36.8 C) 98.9 F (37.2 C)  TempSrc: Oral  Oral Oral  Resp: 18  18 21   Height: 5\' 5"  (1.651 m)     Weight: 76.386 kg (168 lb 6.4 oz)   77.248 kg (170 lb 4.8 oz)  SpO2: 96%  91% 94%    Weight change: 2.086 kg (4 lb 9.6 oz) Filed Weights   07/24/13 1715 07/25/13 1810 07/26/13 0452  Weight: 74.3 kg (163 lb 12.8 oz) 76.386 kg (168 lb 6.4 oz) 77.248 kg (170 lb 4.8 oz)   Body mass index is 28.34 kg/(m^2).   Gen Exam: Awake and alert with clear speech.   Neck: Supple, No  JVD.   Chest: B/L Clear.   CVS: S1 S2 Regular, no murmurs.  Abdomen: soft, BS +, non tender, non distended.  Extremities: no edema, lower extremities warm to touch. Neurologic: Non Focal.   Skin: No Rash.   Wounds: N/A.   Intake/Output from previous day:  Intake/Output Summary (Last 24 hours) at 07/26/13 1024 Last data filed at 07/26/13 1601  Gross per 24 hour  Intake 1466.17 ml  Output      5 ml  Net 1461.17 ml     LAB RESULTS: CBC  Recent Labs Lab 07/24/13 0422 07/24/13 2028 07/25/13 0239 07/26/13 0527  WBC 7.6 5.0 5.1 6.2  HGB 9.0* 8.9* 8.6* 9.0*  HCT 27.9* 27.0* 26.7* 27.9*  PLT 118* 111* 113* 115*  MCV 104.5* 104.2* 104.3* 102.6*  MCH 33.7 34.4* 33.6 33.1  MCHC 32.3 33.0 32.2 32.3  RDW 14.7 15.0 15.1 15.1  LYMPHSABS 0.9 1.2  --   --   MONOABS 0.6 0.5  --   --   EOSABS 0.1 0.2  --   --   BASOSABS 0.0 0.0  --   --     Chemistries   Recent Labs Lab 07/24/13 0422 07/24/13 2028  07/25/13 0239 07/26/13 0527  NA 138 143 142 143  K 4.6 4.8 5.0 4.0  CL 103 114* 114* 113*  CO2 19 16* 17* 17*  GLUCOSE 164* 106* 123* 164*  BUN 40* 33* 30* 16  CREATININE 1.97* 1.62* 1.45* 0.87  CALCIUM 9.3 8.1* 8.2* 8.9    CBG:  Recent Labs Lab 07/25/13 0403 07/25/13 1457 07/25/13 1655 07/25/13 2145 07/26/13 0751  GLUCAP 115* 187* 141* 126* 133*    GFR Estimated Creatinine Clearance: 47.7 ml/min (by C-G formula based on Cr of 0.87).  Coagulation profile No results found for this basename: INR, PROTIME,  in the last 168 hours  Cardiac Enzymes  Recent Labs Lab 07/24/13 0710 07/24/13 2028  TROPONINI <0.30 <0.30    No components found with this basename: POCBNP,  No results found for this basename: DDIMER,  in the last 72 hours No results found for this basename: HGBA1C,  in the last 72 hours No results found for this basename: CHOL, HDL, LDLCALC, TRIG, CHOLHDL, LDLDIRECT,  in the last 72 hours  Recent Labs  07/24/13 2028  TSH 2.363   No results found for this basename: VITAMINB12, FOLATE, FERRITIN, TIBC, IRON, RETICCTPCT,  in the last 72 hours  Recent Labs  07/24/13 0422  LIPASE 31    Urine Studies No results found for this basename: UACOL, UAPR, USPG, UPH, UTP, UGL, UKET, UBIL, UHGB, UNIT, UROB, ULEU, UEPI, UWBC, URBC, UBAC, CAST, CRYS, UCOM, BILUA,  in the last 72 hours  MICROBIOLOGY: Recent Results (from the past 240 hour(s))  URINE CULTURE     Status: None   Collection Time    07/24/13  3:54 AM      Result Value Ref Range Status   Specimen Description URINE, CATHETERIZED   Final   Special Requests NONE   Final   Culture  Setup Time     Final   Value: 07/24/2013 08:33     Performed at [facility] Count     Final   Value: >=100,000 COLONIES/ML     Performed at [facility]   Culture     Final   Value: ESCHERICHIA COLI     Performed at [facility]   Report Status PENDING   Incomplete  CULTURE,  BLOOD  (ROUTINE X 2)     Status: None   Collection Time    07/24/13  8:40 AM      Result Value Ref Range Status   Specimen Description BLOOD LEFT HAND   Final   Special Requests BOTTLES DRAWN AEROBIC AND ANAEROBIC 3CC   Final   Culture  Setup Time     Final   Value: 07/24/2013 09:57     Performed at [facility]   Culture     Final   Value:        BLOOD CULTURE RECEIVED NO GROWTH TO DATE CULTURE WILL BE HELD FOR 5 DAYS BEFORE ISSUING A FINAL NEGATIVE REPORT     Performed at [facility]   Report Status PENDING   Incomplete  MRSA PCR SCREENING     Status: None   Collection Time    07/24/13  5:29 PM      Result Value Ref Range Status   MRSA by PCR NEGATIVE  NEGATIVE Final   Comment:            The GeneXpert MRSA Assay (FDA     approved for NASAL specimens     only), is one component of a     comprehensive MRSA colonization     surveillance program. It is not     intended to diagnose MRSA     infection nor to guide or     monitor treatment for     MRSA infections.    RADIOLOGY STUDIES/RESULTS: Ct Abdomen Pelvis Wo Contrast  07/24/2013   CLINICAL DATA:  Abdominal pain nausea and vomiting  EXAM: CT ABDOMEN AND PELVIS WITHOUT CONTRAST  TECHNIQUE: Multidetector CT imaging of the abdomen and pelvis was performed following the standard protocol without intravenous contrast.  COMPARISON:  DG ABD ACUTE W/CHEST dated 07/24/2013; CT ABD/PELVIS W CM dated 05/23/2013  FINDINGS: The rectosigmoid colon is decompressed with mild wall thickening measuring 5 mm but no surrounding stranding or dilatation. Small bowel caliber is normal. Streak artifact from left total hip arthroplasty obscures detail of the pelvis. No free air or fluid. No lymphadenopathy.  Trace bilateral pleural effusions with associated compressive atelectasis noted. Cardiomegaly is present.  Cholecystectomy clips are noted. Unenhanced liver, adrenal glands, spleen, and pancreas are normal. Horseshoe kidney configuration  reidentified with vascular calcifications and vascular calcification or nonobstructing left renal moiety 3 mm calculus image 41.  Moderate to severe atheromatous aortic calcification without aneurysm. The bladder is mildly thick walled, 5 mm. No radiopaque ureteral or bladder calculus is identified. The patient is rotated. Multilevel disc degenerative changes identified but no acute osseous finding is present. No compression deformity.  IMPRESSION: Rectosigmoid mild bowel wall thickening to 5 mm without surrounding stranding, which may be at upper limits of normal given the degree of collapse. However, early colitis could have this appearance. Bowel ischemia is possible. No evidence for bowel obstruction.  Mild bladder wall thickening, correlate with urinalysis for evidence of urinary tract infection.   Electronically Signed   By: Conchita Paris M.D.   On: 07/24/2013 09:29   Dg Abd Acute W/chest  07/24/2013   CLINICAL DATA:  Vomiting, abdominal pain.  EXAM: ACUTE ABDOMEN SERIES (ABDOMEN 2 VIEW & CHEST 1 VIEW)  COMPARISON:  05/29/2013 and multiple other Plain films. CT 05/23/2013.  FINDINGS: Cardiomegaly. Lungs are clear. No effusions or acute bony abnormality.  Diffuse gaseous distention of the colon, dilated sigmoid colon. Cannot exclude recurrent sigmoid volvulus. No free air. No  organomegaly. Prior cholecystectomy.  IMPRESSION: Diffuse gaseous distention of the colon with most pronounced gaseous distention in the sigmoid colon. Cannot exclude recurrent sigmoid volvulus.   Electronically Signed   By: Rolm Baptise M.D.   On: 07/24/2013 04:23    [name], MD  [facility] Pager:[phone]  If 7PM-7AM, please contact night-coverage [email or web address] Password [email or web address] 07/26/2013, 10:24 AM   LOS: 2 days

## 2013-07-26 NOTE — Consult Note (Signed)
I have reviewed this case with our NP and agree with the Assessment and Plan as stated.  Brandonn Capelli L. Ranger Petrich, MD MBA [facility] Palliative [facility] Team at [facility] Phone: [phone] Pager: [phone]   

## 2013-07-26 NOTE — Progress Notes (Signed)
  Subjective: sleeping but arousable.  Complains of abdominal pain.  Bowels moving.   Objective: Vital signs in last 24 hours: Temp:  [98.3 F (36.8 C)-99.1 F (37.3 C)] 98.9 F (37.2 C) (02/21 0452) Pulse Rate:  [73-86] 86 (02/21 0452) Resp:  [18-21] 21 (02/21 0452) BP: (137-148)/(71-85) 147/74 mmHg (02/21 0452) SpO2:  [91 %-96 %] 94 % (02/21 0452) Weight:  [168 lb 6.4 oz (76.386 kg)-170 lb 4.8 oz (77.248 kg)] 170 lb 4.8 oz (77.248 kg) (02/21 0452) Last BM Date: 07/25/13  Intake/Output from previous day: 02/20 0701 - 02/21 0700 In: 3421.7 [P.O.:942; I.V.:2429.7; IV Piggyback:50] Out: 4 [Urine:2; Stool:2] Intake/Output this shift: Total I/O In: 100 [P.O.:100] Out: 1 [Urine:1]  GI: abnormal findings:  mild TTP lower abdomen. no peritonitis.  no significant distension.   Lab Results:   Recent Labs  07/25/13 0239 07/26/13 0527  WBC 5.1 6.2  HGB 8.6* 9.0*  HCT 26.7* 27.9*  PLT 113* 115*   BMET  Recent Labs  07/25/13 0239 07/26/13 0527  NA 142 143  K 5.0 4.0  CL 114* 113*  CO2 17* 17*  GLUCOSE 123* 164*  BUN 30* 16  CREATININE 1.45* 0.87  CALCIUM 8.2* 8.9   PT/INR No results found for this basename: LABPROT, INR,  in the last 72 hours ABG No results found for this basename: PHART, PCO2, PO2, HCO3,  in the last 72 hours  Studies/Results: No results found.  Anti-infectives: Anti-infectives   Start     Dose/Rate Route Frequency Ordered Stop   07/25/13 1845  cefTRIAXone (ROCEPHIN) 1 g in dextrose 5 % 50 mL IVPB     1 g 100 mL/hr over 30 Minutes Intravenous Every 24 hours 07/25/13 1807     07/25/13 0800  vancomycin (VANCOCIN) IVPB 750 mg/150 ml premix  Status:  Discontinued     750 mg 150 mL/hr over 60 Minutes Intravenous Every 24 hours 07/24/13 0652 07/24/13 1021   07/24/13 2200  Ampicillin-Sulbactam (UNASYN) 3 g in sodium chloride 0.9 % 100 mL IVPB  Status:  Discontinued     3 g 100 mL/hr over 60 Minutes Intravenous Every 12 hours 07/24/13 1059  07/25/13 1807   07/24/13 1400  piperacillin-tazobactam (ZOSYN) IVPB 3.375 g  Status:  Discontinued     3.375 g 12.5 mL/hr over 240 Minutes Intravenous 3 times per day 07/24/13 0652 07/24/13 1021   07/24/13 1045  Ampicillin-Sulbactam (UNASYN) 3 g in sodium chloride 0.9 % 100 mL IVPB     3 g 100 mL/hr over 60 Minutes Intravenous STAT 07/24/13 1037 07/24/13 1223   07/24/13 0700  vancomycin (VANCOCIN) IVPB 1000 mg/200 mL premix  Status:  Discontinued     1,000 mg 200 mL/hr over 60 Minutes Intravenous  Once 07/24/13 0646 07/24/13 1037   07/24/13 0700  piperacillin-tazobactam (ZOSYN) IVPB 3.375 g  Status:  Discontinued     3.375 g 100 mL/hr over 30 Minutes Intravenous  Once 07/24/13 0646 07/24/13 1037   07/24/13 0430  ciprofloxacin (CIPRO) IVPB 400 mg     400 mg 200 mL/hr over 60 Minutes Intravenous  Once 07/24/13 0420 07/24/13 0553      Assessment/Plan:  LOS: 2 days   Hx of sigmoid volvulus in [place] 2014 treated endoscopically  Mild colitis Not operative candidate.  looks ok.  Some pain today but moving bowels. Check KUB today.  Maria Christian A. 07/26/2013

## 2013-07-27 ENCOUNTER — Inpatient Hospital Stay (HOSPITAL_COMMUNITY): Payer: PRIVATE HEALTH INSURANCE

## 2013-07-27 DIAGNOSIS — K562 Volvulus: Secondary | ICD-10-CM

## 2013-07-27 LAB — CBC
HCT: 25.7 % — ABNORMAL LOW (ref 36.0–46.0)
HEMOGLOBIN: 8.8 g/dL — AB (ref 12.0–15.0)
MCH: 34.6 pg — ABNORMAL HIGH (ref 26.0–34.0)
MCHC: 34.2 g/dL (ref 30.0–36.0)
MCV: 101.2 fL — ABNORMAL HIGH (ref 78.0–100.0)
Platelets: 109 10*3/uL — ABNORMAL LOW (ref 150–400)
RBC: 2.54 MIL/uL — ABNORMAL LOW (ref 3.87–5.11)
RDW: 15.1 % (ref 11.5–15.5)
WBC: 6 10*3/uL (ref 4.0–10.5)

## 2013-07-27 LAB — GLUCOSE, CAPILLARY
GLUCOSE-CAPILLARY: 117 mg/dL — AB (ref 70–99)
Glucose-Capillary: 140 mg/dL — ABNORMAL HIGH (ref 70–99)
Glucose-Capillary: 140 mg/dL — ABNORMAL HIGH (ref 70–99)
Glucose-Capillary: 138 mg/dL — ABNORMAL HIGH (ref 70–99)

## 2013-07-27 LAB — URINE CULTURE

## 2013-07-27 LAB — BASIC METABOLIC PANEL
BUN: 12 mg/dL (ref 6–23)
CO2: 18 mEq/L — ABNORMAL LOW (ref 19–32)
Calcium: 9 mg/dL (ref 8.4–10.5)
Chloride: 114 mEq/L — ABNORMAL HIGH (ref 96–112)
Creatinine, Ser: 0.81 mg/dL (ref 0.50–1.10)
GFR calc Af Amer: 74 mL/min — ABNORMAL LOW (ref >90)
GFR, EST NON AFRICAN AMERICAN: 64 mL/min — AB (ref >90)
Glucose, Bld: 156 mg/dL — ABNORMAL HIGH (ref 70–99)
POTASSIUM: 3.7 meq/L (ref 3.7–5.3)
SODIUM: 144 meq/L (ref 137–147)

## 2013-07-27 NOTE — Progress Notes (Addendum)
Subjective: Some loose stools, no emesis, cant hear  Objective: Vital signs in last 24 hours: Temp:  [98 F (36.7 C)-98.3 F (36.8 C)] 98 F (36.7 C) (02/22 0553) Pulse Rate:  [76-82] 82 (02/22 0553) Resp:  [18-20] 18 (02/22 0553) BP: (120-132)/(68-79) 132/79 mmHg (02/22 0553) SpO2:  [96 %-97 %] 96 % (02/22 0553) Last BM Date: 07/26/13  Intake/Output from previous day: 02/21 0701 - 02/22 0700 In: 854 [P.O.:100; I.V.:704; IV Piggyback:50] Out: -  Intake/Output this shift: Total I/O In: 60 [I.V.:60] Out: -   GI: distended , nontender, some bs  Lab Results:   Recent Labs  07/26/13 0527 07/27/13 0400  WBC 6.2 6.0  HGB 9.0* 8.8*  HCT 27.9* 25.7*  PLT 115* 109*   BMET  Recent Labs  07/26/13 0527 07/27/13 0400  NA 143 144  K 4.0 3.7  CL 113* 114*  CO2 17* 18*  GLUCOSE 164* 156*  BUN 16 12  CREATININE 0.87 0.81  CALCIUM 8.9 9.0   PT/INR No results found for this basename: LABPROT, INR,  in the last 72 hours ABG No results found for this basename: PHART, PCO2, PO2, HCO3,  in the last 72 hours  Studies/Results: Dg Abd 1 View  07/26/2013   CLINICAL DATA:  Abdominal pain and tenderness to palpation in the lower abdomen on physical examination.  EXAM: ABDOMEN - 1 VIEW  COMPARISON:  CT ABD/PELV WO CM dated 07/24/2013; DG ABD ACUTE W/CHEST dated 07/24/2013; DG ABD 2 VIEWS dated 05/29/2013; DG ABD 2 VIEWS dated 05/27/2013  FINDINGS: Gaseous distention of the sigmoid colon to the level of the rectosigmoid junction, corresponding to the site of the collapsed distal sigmoid on the CT 2 days ago. Gas within upper normal ascending colon, transverse colon, and descending colon. This has improved since the acute abdomen series 2 days ago. Gas in multiple nondistended loops of small bowel. No suggestion of free air on the supine image. Surgical clips in the right upper quadrant from prior cholecystectomy. Aorto iliac atherosclerosis without aneurysm.  IMPRESSION: Improvement in  the bowel gas pattern since the examination 2 days ago. Persistent moderate gaseous distention of the sigmoid colon to the level of the rectosigmoid junction (corresponding to the level of the collapsed segment on the CT 2 days ago with borderline thickened wall). Remainder of the colon now normal in caliber.   Electronically Signed   By: Evangeline Dakin M.D.   On: 07/26/2013 22:02   Dg Abd Portable 1v  07/27/2013   CLINICAL DATA:  Abdominal pain.  EXAM: PORTABLE ABDOMEN - 1 VIEW  COMPARISON:  07/26/2013  FINDINGS: Diffuse gaseous distention of the colon is again seen to the rectosigmoid. The shows no significant change since prior exam. No evidence of dilated small bowel loops. Right upper quadrant surgical clips again seen as well as diffuse atherosclerotic calcification of aorta and iliac arteries.  IMPRESSION: Diffuse gaseous distention of colon, without significant change.   Electronically Signed   By: Earle Gell M.D.   On: 07/27/2013 10:12    Anti-infectives: Anti-infectives   Start     Dose/Rate Route Frequency Ordered Stop   07/25/13 1845  cefTRIAXone (ROCEPHIN) 1 g in dextrose 5 % 50 mL IVPB     1 g 100 mL/hr over 30 Minutes Intravenous Every 24 hours 07/25/13 1807     07/25/13 0800  vancomycin (VANCOCIN) IVPB 750 mg/150 ml premix  Status:  Discontinued     750 mg 150 mL/hr over 60 Minutes Intravenous Every  24 hours 07/24/13 0652 07/24/13 1021   07/24/13 2200  Ampicillin-Sulbactam (UNASYN) 3 g in sodium chloride 0.9 % 100 mL IVPB  Status:  Discontinued     3 g 100 mL/hr over 60 Minutes Intravenous Every 12 hours 07/24/13 1059 07/25/13 1807   07/24/13 1400  piperacillin-tazobactam (ZOSYN) IVPB 3.375 g  Status:  Discontinued     3.375 g 12.5 mL/hr over 240 Minutes Intravenous 3 times per day 07/24/13 0652 07/24/13 1021   07/24/13 1045  Ampicillin-Sulbactam (UNASYN) 3 g in sodium chloride 0.9 % 100 mL IVPB     3 g 100 mL/hr over 60 Minutes Intravenous STAT 07/24/13 1037 07/24/13 1223    07/24/13 0700  vancomycin (VANCOCIN) IVPB 1000 mg/200 mL premix  Status:  Discontinued     1,000 mg 200 mL/hr over 60 Minutes Intravenous  Once 07/24/13 0646 07/24/13 1037   07/24/13 0700  piperacillin-tazobactam (ZOSYN) IVPB 3.375 g  Status:  Discontinued     3.375 g 100 mL/hr over 30 Minutes Intravenous  Once 07/24/13 0646 07/24/13 1037   07/24/13 0430  ciprofloxacin (CIPRO) IVPB 400 mg     400 mg 200 mL/hr over 60 Minutes Intravenous  Once 07/24/13 0420 07/24/13 0553      Assessment/Plan: Sigmoid volvulus, uti  I think she still may have volvulus intermittently. She certainly may have a volvulus now.  She is not really tender but she is distended and plain films look that way. She just wants to go home.  If she wants treated would need gi evaluation again and discussion for surgery although I think this would not really give her any quality of life.  Some discussion on how to treat her (palliatively) needs to be discussed with family   [facility] 07/27/2013

## 2013-07-27 NOTE — Progress Notes (Signed)
PATIENT DETAILS Name: Maria Christian Age: 80 y.o. Sex: female Date of Birth: 09-29-1924 Admit Date: 07/24/2013 Admitting Physician Debbe Odea, MD QIO:[email or web address], MD  Brief narrative:  80 year old female with multiple medical problems including CHF, atrial fibrillation, diabetes, history of volvulus. Last admitted in December for issues related to sigmoid volvulus. Returned to the ER on 2/19 after being found hypotensive with one episode of nausea with vomiting. On further evaluation, she was found to have a UTI, in the emergency room, x-rays claims questioned whether she had another volvulus. Patient was then admitted for further evaluation and treatment. Initially she was admitted to the step down unit, after improvement, she was then transferred. General surgery has evaluated and they don't think that the patient has any active volvulus at this time.  Subjective: Apart from chronic neck pain, no major complaints this morning.  Assessment/Plan:   E. coli UTI  - On admission was on vancomycin and Zosyn, antibiotics have now been narrowed to Rocephin-day 3,  sensitivities pending. Blood culture drawn on 2/19 negative. -Suspect cause of nausea vomiting and abdominal pain     Hypotension - Suspect this was secondary to hypovolemia secondary to dehydration. This was seen on admission, and has resolved with IV hydration. - Blood pressure now is on the higher side, we'll stop all IV fluids. Coreg has been restarted.    Acute renal failure - Likely prerenal from dehydration - Resolved with hydration    Dehydration - Secondary to poor oral intake and UTI. - Resolved with hydration    Hx Volvulus of sigmoid colon  -Appreciate surgical eval  -Although has sigmoid wall thickening of unclear etiology on diagnostic evaluation surgery does not think patient has recurrent volvulus and plans on advancing diet as tolerated    Chronic Thrombocytopenia - Slight  worsening likely secondary to to acute illness/UTI. Review of prior labs do show chronic mild thrombocytopenia. - Currently very mild, continue to monitor.    Diastolic CHF, chronic  -Compensated  -Noted on ACE inhibitor with Lasix prior to admission - will need to be very careful with these medications given patient's advanced age, failure to thrive symptoms and tendency to get dehydrated with diuretic therapy resulting in renal failure. For now continue to hold his medications. - Already on Coreg, continue.     Hypertension - Moderately Controlled, continue with Coreg, slowly resume amlodipine as blood pressure permits.    Diabetes - Last A1c in December 2014 was 6.4. - Given advanced age, allow some permissive hyper glycemia to prevent life-threatening hypoglycemic episodes. - Continue cautiously with SSI on inpatient. Was on metformin prior to admission, this was placed on hold secondary to renal failure. Suspect can be discontinued on discharge and can be just watched.    History of cerebrovascular disease - Reviewed prior neurology notes-apparently has a history of silent cerebrovascular disease on MRI of brain. - Continue Plavix-for secondary stroke prevention    History of dementia - Stable, continue with Aricept.    History of Parkinson's disease - Stable- apparently not on any anti-Parkinson's treatment    History of depression - Continue with Zoloft.    History of paroxysmal atrial fibrillation. - Continue Coreg. - Suspect not a anticoagulation candidate given failure to thrive syndrome, fall risk. Continue antiplatelet agents     Failure to thrive syndrome - Appreciate palliative care consultation - Nutritional eval, physical therapy eval - Suspect back to SNF in the next few days  Disposition: Remain inpatient  DVT Prophylaxis:  SCD's given thrombocytopenia  Code Status:  DNR  Family Communication None at  bedside  Procedures:  None  CONSULTS:  general surgery and Palliative care  MEDICATIONS: Scheduled Meds: . carvedilol  3.125 mg Oral Daily  . cefTRIAXone (ROCEPHIN)  IV  1 g Intravenous Q24H  . clopidogrel  75 mg Oral Daily  . collagenase   Topical Daily  . donepezil  5 mg Oral QHS  . dorzolamide  1 drop Both Eyes TID  . feeding supplement (ENSURE COMPLETE)  237 mL Oral BID BM  . insulin aspart  0-9 Units Subcutaneous TID WC  . ketotifen  1 drop Both Eyes BID  . lactulose  30 g Oral BID  . latanoprost  1 drop Both Eyes QHS  . polyethylene glycol  17 g Oral Daily  . sertraline  50 mg Oral QHS  . sodium chloride  3 mL Intravenous Q12H   Continuous Infusions: . sodium chloride 20 mL/hr (07/26/13 1428)   PRN Meds:.acetaminophen, acetaminophen, ALPRAZolam, ALPRAZolam, ondansetron (ZOFRAN) IV, ondansetron  Antibiotics: Anti-infectives   Start     Dose/Rate Route Frequency Ordered Stop   07/25/13 1845  cefTRIAXone (ROCEPHIN) 1 g in dextrose 5 % 50 mL IVPB     1 g 100 mL/hr over 30 Minutes Intravenous Every 24 hours 07/25/13 1807     07/25/13 0800  vancomycin (VANCOCIN) IVPB 750 mg/150 ml premix  Status:  Discontinued     750 mg 150 mL/hr over 60 Minutes Intravenous Every 24 hours 07/24/13 0652 07/24/13 1021   07/24/13 2200  Ampicillin-Sulbactam (UNASYN) 3 g in sodium chloride 0.9 % 100 mL IVPB  Status:  Discontinued     3 g 100 mL/hr over 60 Minutes Intravenous Every 12 hours 07/24/13 1059 07/25/13 1807   07/24/13 1400  piperacillin-tazobactam (ZOSYN) IVPB 3.375 g  Status:  Discontinued     3.375 g 12.5 mL/hr over 240 Minutes Intravenous 3 times per day 07/24/13 0652 07/24/13 1021   07/24/13 1045  Ampicillin-Sulbactam (UNASYN) 3 g in sodium chloride 0.9 % 100 mL IVPB     3 g 100 mL/hr over 60 Minutes Intravenous STAT 07/24/13 1037 07/24/13 1223   07/24/13 0700  vancomycin (VANCOCIN) IVPB 1000 mg/200 mL premix  Status:  Discontinued     1,000 mg 200 mL/hr over 60 Minutes  Intravenous  Once 07/24/13 0646 07/24/13 1037   07/24/13 0700  piperacillin-tazobactam (ZOSYN) IVPB 3.375 g  Status:  Discontinued     3.375 g 100 mL/hr over 30 Minutes Intravenous  Once 07/24/13 0646 07/24/13 1037   07/24/13 0430  ciprofloxacin (CIPRO) IVPB 400 mg     400 mg 200 mL/hr over 60 Minutes Intravenous  Once 07/24/13 0420 07/24/13 0553       PHYSICAL EXAM: Vital signs in last 24 hours: Filed Vitals:   07/26/13 1159 07/26/13 2032 07/27/13 0553 07/27/13 1246  BP: 122/73 120/68 132/79 154/74  Pulse: 76 78 82 91  Temp: 98.3 F (36.8 C) 98.2 F (36.8 C) 98 F (36.7 C) 98.3 F (36.8 C)  TempSrc: Oral Oral Oral Oral  Resp: 20 18 18 20   Height:      Weight:      SpO2: 97% 97% 96% 95%    Weight change:  Filed Weights   07/24/13 1715 07/25/13 1810 07/26/13 0452  Weight: 74.3 kg (163 lb 12.8 oz) 76.386 kg (168 lb 6.4 oz) 77.248 kg (170 lb 4.8 oz)   Body mass index is  28.34 kg/(m^2).   Gen Exam: Awake and alert with clear speech.   Neck: Supple, No JVD.   Chest: B/L Clear.   CVS: S1 S2 Regular, no murmurs.  Abdomen: soft, BS +, non tender, non distended.  Extremities: no edema, lower extremities warm to touch. Neurologic: Non Focal.   Skin: No Rash.   Wounds: N/A.   Intake/Output from previous day:  Intake/Output Summary (Last 24 hours) at 07/27/13 1355 Last data filed at 07/27/13 1000  Gross per 24 hour  Intake    764 ml  Output      0 ml  Net    764 ml     LAB RESULTS: CBC  Recent Labs Lab 07/24/13 0422 07/24/13 2028 07/25/13 0239 07/26/13 0527 07/27/13 0400  WBC 7.6 5.0 5.1 6.2 6.0  HGB 9.0* 8.9* 8.6* 9.0* 8.8*  HCT 27.9* 27.0* 26.7* 27.9* 25.7*  PLT 118* 111* 113* 115* 109*  MCV 104.5* 104.2* 104.3* 102.6* 101.2*  MCH 33.7 34.4* 33.6 33.1 34.6*  MCHC 32.3 33.0 32.2 32.3 34.2  RDW 14.7 15.0 15.1 15.1 15.1  LYMPHSABS 0.9 1.2  --   --   --   MONOABS 0.6 0.5  --   --   --   EOSABS 0.1 0.2  --   --   --   BASOSABS 0.0 0.0  --   --   --      Chemistries   Recent Labs Lab 07/24/13 0422 07/24/13 2028 07/25/13 0239 07/26/13 0527 07/27/13 0400  NA 138 143 142 143 144  K 4.6 4.8 5.0 4.0 3.7  CL 103 114* 114* 113* 114*  CO2 19 16* 17* 17* 18*  GLUCOSE 164* 106* 123* 164* 156*  BUN 40* 33* 30* 16 12  CREATININE 1.97* 1.62* 1.45* 0.87 0.81  CALCIUM 9.3 8.1* 8.2* 8.9 9.0    CBG:  Recent Labs Lab 07/26/13 1130 07/26/13 1718 07/26/13 2200 07/27/13 0824 07/27/13 1131  GLUCAP 195* 154* 135* 140* 140*    GFR Estimated Creatinine Clearance: 51.2 ml/min (by C-G formula based on Cr of 0.81).  Coagulation profile No results found for this basename: INR, PROTIME,  in the last 168 hours  Cardiac Enzymes  Recent Labs Lab 07/24/13 0710 07/24/13 2028  TROPONINI <0.30 <0.30    No components found with this basename: POCBNP,  No results found for this basename: DDIMER,  in the last 72 hours No results found for this basename: HGBA1C,  in the last 72 hours No results found for this basename: CHOL, HDL, LDLCALC, TRIG, CHOLHDL, LDLDIRECT,  in the last 72 hours  Recent Labs  07/24/13 2028  TSH 2.363   No results found for this basename: VITAMINB12, FOLATE, FERRITIN, TIBC, IRON, RETICCTPCT,  in the last 72 hours No results found for this basename: LIPASE, AMYLASE,  in the last 72 hours  Urine Studies No results found for this basename: UACOL, UAPR, USPG, UPH, UTP, UGL, UKET, UBIL, UHGB, UNIT, UROB, ULEU, UEPI, UWBC, URBC, UBAC, CAST, CRYS, UCOM, BILUA,  in the last 72 hours  MICROBIOLOGY: Recent Results (from the past 240 hour(s))  URINE CULTURE     Status: None   Collection Time    07/24/13  3:54 AM      Result Value Ref Range Status   Specimen Description URINE, CATHETERIZED   Final   Special Requests NONE   Final   Culture  Setup Time     Final   Value: 07/24/2013 08:33     Performed at  [facility] [employer] Count     Final   Value: >=100,000 COLONIES/ML     Performed at [employer]     Final   Value: ESCHERICHIA COLI     Note: Confirmed Extended Spectrum Beta-Lactamase Producer (ESBL)     Performed at [facility]   Report Status 07/27/2013 FINAL   Final   Organism ID, Bacteria ESCHERICHIA COLI   Final  CULTURE, BLOOD (ROUTINE X 2)     Status: None   Collection Time    07/24/13  8:40 AM      Result Value Ref Range Status   Specimen Description BLOOD LEFT HAND   Final   Special Requests BOTTLES DRAWN AEROBIC AND ANAEROBIC 3CC   Final   Culture  Setup Time     Final   Value: 07/24/2013 09:57     Performed at [facility]   Culture     Final   Value:        BLOOD CULTURE RECEIVED NO GROWTH TO DATE CULTURE WILL BE HELD FOR 5 DAYS BEFORE ISSUING A FINAL NEGATIVE REPORT     Performed at [facility]   Report Status PENDING   Incomplete  MRSA PCR SCREENING     Status: None   Collection Time    07/24/13  5:29 PM      Result Value Ref Range Status   MRSA by PCR NEGATIVE  NEGATIVE Final   Comment:            The GeneXpert MRSA Assay (FDA     approved for NASAL specimens     only), is one component of a     comprehensive MRSA colonization     surveillance program. It is not     intended to diagnose MRSA     infection nor to guide or     monitor treatment for     MRSA infections.    RADIOLOGY STUDIES/RESULTS: Ct Abdomen Pelvis Wo Contrast  07/24/2013   CLINICAL DATA:  Abdominal pain nausea and vomiting  EXAM: CT ABDOMEN AND PELVIS WITHOUT CONTRAST  TECHNIQUE: Multidetector CT imaging of the abdomen and pelvis was performed following the standard protocol without intravenous contrast.  COMPARISON:  DG ABD ACUTE W/CHEST dated 07/24/2013; CT ABD/PELVIS W CM dated 05/23/2013  FINDINGS: The rectosigmoid colon is decompressed with mild wall thickening measuring 5 mm but no surrounding stranding or dilatation. Small bowel caliber is normal. Streak artifact from left total hip arthroplasty obscures detail of the pelvis. No free air or fluid.  No lymphadenopathy.  Trace bilateral pleural effusions with associated compressive atelectasis noted. Cardiomegaly is present.  Cholecystectomy clips are noted. Unenhanced liver, adrenal glands, spleen, and pancreas are normal. Horseshoe kidney configuration reidentified with vascular calcifications and vascular calcification or nonobstructing left renal moiety 3 mm calculus image 41.  Moderate to severe atheromatous aortic calcification without aneurysm. The bladder is mildly thick walled, 5 mm. No radiopaque ureteral or bladder calculus is identified. The patient is rotated. Multilevel disc degenerative changes identified but no acute osseous finding is present. No compression deformity.  IMPRESSION: Rectosigmoid mild bowel wall thickening to 5 mm without surrounding stranding, which may be at upper limits of normal given the degree of collapse. However, early colitis could have this appearance. Bowel ischemia is possible. No evidence for bowel obstruction.  Mild bladder wall thickening, correlate with urinalysis for evidence of urinary tract infection.   Electronically Signed  By: Conchita Paris M.D.   On: 07/24/2013 09:29   Dg Abd Acute W/chest  07/24/2013   CLINICAL DATA:  Vomiting, abdominal pain.  EXAM: ACUTE ABDOMEN SERIES (ABDOMEN 2 VIEW & CHEST 1 VIEW)  COMPARISON:  05/29/2013 and multiple other Plain films. CT 05/23/2013.  FINDINGS: Cardiomegaly. Lungs are clear. No effusions or acute bony abnormality.  Diffuse gaseous distention of the colon, dilated sigmoid colon. Cannot exclude recurrent sigmoid volvulus. No free air. No organomegaly. Prior cholecystectomy.  IMPRESSION: Diffuse gaseous distention of the colon with most pronounced gaseous distention in the sigmoid colon. Cannot exclude recurrent sigmoid volvulus.   Electronically Signed   By: Rolm Baptise M.D.   On: 07/24/2013 04:23    Thurnell Lose, MD  [facility] Pager:[phone]  If 7PM-7AM, please contact  night-coverage [email or web address] Password [email or web address] 07/27/2013, 1:55 PM   LOS: 3 days

## 2013-07-27 NOTE — Plan of Care (Signed)
Problem: Phase I Progression Outcomes Goal: OOB as tolerated unless otherwise ordered Outcome: Completed/Met Date Met:  07/27/13 Patient OOB to chair for meals today

## 2013-07-27 NOTE — Plan of Care (Signed)
Problem: Phase I Progression Outcomes Goal: Initial discharge plan identified Patient improving

## 2013-07-28 LAB — GLUCOSE, CAPILLARY
GLUCOSE-CAPILLARY: 143 mg/dL — AB (ref 70–99)
Glucose-Capillary: 180 mg/dL — ABNORMAL HIGH (ref 70–99)

## 2013-07-28 MED ORDER — LIDOCAINE 5 % EX PTCH: 1.0000 | MEDICATED_PATCH | CUTANEOUS | Status: DC

## 2013-07-28 MED ORDER — DOCUSATE SODIUM 100 MG PO CAPS: 100.0000 mg | ORAL_CAPSULE | Freq: Two times a day (BID) | ORAL | Status: DC

## 2013-07-28 MED ORDER — ALPRAZOLAM 0.25 MG PO TABS: 0.2500 mg | ORAL_TABLET | Freq: Two times a day (BID) | ORAL | Status: DC | PRN

## 2013-07-28 NOTE — Discharge Instructions (Signed)
Follow with Primary MD Ezequiel Kayser, MD in 7 days   Get CBC, CMP, checked 7 days by Primary MD and again as instructed by your Primary MD. .   Activity: As tolerated with Full fall precautions use walker/cane & assistance as needed   Disposition SNF   Diet: Heart Healthy - low carbohydrate  with assistance and aspiration precautions  For Heart failure patients - Check your Weight same time everyday, if you gain over 2 pounds, or you develop in leg swelling, experience more shortness of breath or chest pain, call your Primary MD immediately. Follow Cardiac Low Salt Diet and 1.8 lit/day fluid restriction.   On your next visit with her primary care physician please Get Medicines reviewed and adjusted.  Please request your Prim.MD to go over all Hospital Tests and Procedure/Radiological results at the follow up, please get all Hospital records sent to your Prim MD by signing hospital release before you go home.   If you experience worsening of your admission symptoms, develop shortness of breath, life threatening emergency, suicidal or homicidal thoughts you must seek medical attention immediately by calling 911 or calling your MD immediately  if symptoms less severe.  You Must read complete instructions/literature along with all the possible adverse reactions/side effects for all the Medicines you take and that have been prescribed to you. Take any new Medicines after you have completely understood and accpet all the possible adverse reactions/side effects.   Do not drive and provide baby sitting services if your were admitted for syncope or siezures until you have seen by Primary MD or a Neurologist and advised to do so again.  Do not drive when taking Pain medications.    Do not take more than prescribed Pain, Sleep and Anxiety Medications  Special Instructions: If you have smoked or chewed Tobacco  in the last 2 yrs please stop smoking, stop any regular Alcohol  and or any  Recreational drug use.  Wear Seat belts while driving.   Please note  You were cared for by a hospitalist during your hospital stay. If you have any questions about your discharge medications or the care you received while you were in the hospital after you are discharged, you can call the unit and asked to speak with the hospitalist on call if the hospitalist that took care of you is not available. Once you are discharged, your primary care physician will handle any further medical issues. Please note that NO REFILLS for any discharge medications will be authorized once you are discharged, as it is imperative that you return to your primary care physician (or establish a relationship with a primary care physician if you do not have one) for your aftercare needs so that they can reassess your need for medications and monitor your lab values.

## 2013-07-28 NOTE — Discharge Summary (Addendum)
Physician Discharge Summary  Maria Christian [id number] DOB: 10-Dec-1924 DOA: 07/24/2013  PCP: Ezequiel Kayser, MD  Admit date: 07/24/2013 Discharge date: 07/28/2013  Time spent: 45 minutes  Recommendations for Outpatient Follow-up:  1. Discharge to SNF with understanding that volvulus is intermittent and patient is not a surgical candidate. If episode recurs, comfort and palliative care measures should be taken. Patient and family are in agreement with this plan.    Discharge Diagnoses:    Atrial fibrillation with controlled ventricular response -Stable -Not a candidate for anticoagulation -Continue carvedilol   Diastolic CHF, chronic -Compensated -Previously on ACEI and Lasix resulting in dehydration and renal failure, resume as now has stable oral intake and appears to be well hydrated. -Continue carvedilol  Parkinson disease -Stable -Not currently on anti-Parkinson's tx  Hypertension -Moderately controlled this visit -Continue Carvedilol -Resume amlodipine  Diabetes mellitus -A1c in December was 6.4 -Glucose levels stable -Allow some hyperglycemia to prevent hypoglycemic episodes -Resume metformin  E. coli UTI -Adequately treated with IV Rocephin  Dementia -Stable -Continue Aricept  Volvulus of sigmoid colon -Intermittent volvulus -Not a surgical candidate -If this becomes an issue in the future please focus on comfort measures, patient has been seen by palliative care, I have discussed this plan with patient's power of attorney her son over the phone in detail and he is agreeable to it. Patient clinically currently is not obstructed. She had a bowel movement last evening. She is completely pain-free and nausea free at this time tolerating diet.  Hypotension -Seen on admission -Resolved with IV fluids -Continue Carvedilol  ARF (acute renal failure) -Presumed prerenal due to dehydration -Resolved with IV fluids  Dehydration -Secondary to failure to  thrive, poor and UTI -Resolved with IV fluid hydration  Palliative care encounter -Patient requests to be made comfortable should episode recur -DNR -Family aware and in agreement with this decision    Chronic Thrombocytopenia -Slightly worsened due to acute illness -Currently very mild  Depression -Stable -Continue zoloft  Failure to Thrive -Continue to encourage oral food intake -PT as tolerated -Return to SNF     Discharge Instructions   Follow with Primary MD [name],[name], MD in 7 days   Get CBC, CMP, checked 7 days by Primary MD and again as instructed by your Primary MD.    Activity: As tolerated with Full fall precautions use walker/cane & assistance as needed   Disposition SNF   Diet: Heart Healthy - low carbohydrate  with assistance and aspiration precautions.  For Heart failure patients - Check your Weight same time everyday, if you gain over 2 pounds, or you develop in leg swelling, experience more shortness of breath or chest pain, call your Primary MD immediately. Follow Cardiac Low Salt Diet and 1.8 lit/day fluid restriction.   On your next visit with her primary care physician please Get Medicines reviewed and adjusted.  Please request your Prim.MD to go over all Hospital Tests and Procedure/Radiological results at the follow up, please get all Hospital records sent to your Prim MD by signing hospital release before you go home.   If you experience worsening of your admission symptoms, develop shortness of breath, life threatening emergency, suicidal or homicidal thoughts you must seek medical attention immediately by calling 911 or calling your MD immediately  if symptoms less severe.  You Must read complete instructions/literature along with all the possible adverse reactions/side effects for all the Medicines you take and that have been prescribed to you. Take any new Medicines after you have completely  understood and accpet all the possible  adverse reactions/side effects.   Do not drive and provide baby sitting services if your were admitted for syncope or siezures until you have seen by Primary MD or a Neurologist and advised to do so again.  Do not drive when taking Pain medications.    Do not take more than prescribed Pain, Sleep and Anxiety Medications  Special Instructions: If you have smoked or chewed Tobacco  in the last 2 yrs please stop smoking, stop any regular Alcohol  and or any Recreational drug use.  Wear Seat belts while driving.   Please note  You were cared for by a hospitalist during your hospital stay. If you have any questions about your discharge medications or the care you received while you were in the hospital after you are discharged, you can call the unit and asked to speak with the hospitalist on call if the hospitalist that took care of you is not available. Once you are discharged, your primary care physician will handle any further medical issues. Please note that NO REFILLS for any discharge medications will be authorized once you are discharged, as it is imperative that you return to your primary care physician (or establish a relationship with a primary care physician if you do not have one) for your aftercare needs so that they can reassess your need for medications and monitor your lab values.     Discharge Condition: Stable, DNR  Diet recommendation: Carbohydrate modified heart diet  Filed Weights   07/25/13 1810 07/26/13 0452 07/28/13 0425  Weight: 76.386 kg (168 lb 6.4 oz) 77.248 kg (170 lb 4.8 oz) 78.245 kg (172 lb 8 oz)    History of present illness:  80 year old female last admitted in December for issues related to sigmoid volvulus. Returned to the ER on 2/19 after being found hypotensive with one episode of nausea with vomiting.  Hospital Course:  In the emergency room patient was found to have a UTI and x-rays were questionable for a second volvulus . Patient was then  admitted on 2/19 for further evaluation and treatment. Initially she was admitted to the step down unit, after improvement, she was then transferred. General surgery has evaluated and they don't think that the patient has any active volvulus at this time. Plan is to discharge back to SNF today.  Procedures:  Abd/pelvic CT  Consultations:  Surgery  Palliative Care  Discharge Exam: Filed Vitals:   07/28/13 0500  BP: 142/79  Pulse: 84  Temp: 98.5 F (36.9 C)  Resp: 16    General: Patient is awake and alert in NAD and appears her stated age. HENT: Neck is supple with no JVD or masses, mucous membranes are moist. Eyes: PERRL. Cardiovascular: RRR, no gallops, rubs, or murmurs. Respiratory: Lungs clear to auscultation bilaterally. No SOB. Abdomen: Soft, non-tender, mildly distended. + BS. Skin: No rashes, normal color and pallor. Extremities: No edema, equal strength in bilateral upper and lower extremities.  Discharge Instructions   Future Appointments Provider Department Dept Phone   09/11/2013 3:30 PM Philmore Pali, NP [facility] [phone]       Medication List    ASK your doctor about these medications       acetaminophen 325 MG tablet  Commonly known as:  TYLENOL  Take 650 mg by mouth 2 (two) times daily. For OA.     ALPRAZolam 0.25 MG tablet  Commonly known as:  XANAX  Take by mouth. 1 tab once daily  at 2pm.     ALPRAZolam 0.25 MG tablet  Commonly known as:  XANAX  Take 0.25 mg by mouth 2 (two) times daily as needed for anxiety.     ALPRAZolam 0.25 MG tablet  Commonly known as:  XANAX  Take 0.25 mg by mouth at bedtime as needed for anxiety.     amLODipine 5 MG tablet  Commonly known as:  NORVASC  Take 5 mg by mouth daily.     aspirin 81 MG chewable tablet  Chew 81 mg by mouth daily.     carvedilol 3.125 MG tablet  Commonly known as:  COREG  Take 3.125 mg by mouth daily.     CERTAGEN PO  Take 1 tablet by mouth daily.      cholecalciferol 1000 UNITS tablet  Commonly known as:  VITAMIN D  Take 1,000 Units by mouth daily.     clopidogrel 75 MG tablet  Commonly known as:  PLAVIX  Take 75 mg by mouth daily.     Cranberry 475 MG Caps  Take 1 capsule by mouth 2 (two) times daily.     dextromethorphan-guaiFENesin 30-600 MG per 12 hr tablet  Commonly known as:  MUCINEX DM  Take 1 tablet by mouth every 12 (twelve) hours.     donepezil 5 MG tablet  Commonly known as:  ARICEPT  Take 5 mg by mouth at bedtime.     dorzolamide 2 % ophthalmic solution  Commonly known as:  TRUSOPT  Place 1 drop into both eyes 3 (three) times daily.     feeding supplement (ENSURE COMPLETE) Liqd  Take 237 mLs by mouth 2 (two) times daily between meals.     ferrous sulfate 325 (65 FE) MG tablet  Take 325 mg by mouth daily with breakfast.     furosemide 40 MG tablet  Commonly known as:  LASIX  Take 40 mg by mouth daily.     gabapentin 300 MG capsule  Commonly known as:  NEURONTIN  Take 300 mg by mouth 2 (two) times daily.     ibuprofen 400 MG tablet  Commonly known as:  ADVIL,MOTRIN  Take 400 mg by mouth daily at 12 noon.     ketotifen 0.025 % ophthalmic solution  Commonly known as:  ZADITOR  Place 1 drop into both eyes 2 (two) times daily.     lactulose 10 GM/15ML solution  Commonly known as:  CHRONULAC  Take 30 g by mouth 2 (two) times daily.     lidocaine 5 %  Commonly known as:  LIDODERM  Place 1 patch onto the skin daily. Apply 1 patch to left lower back/hip every morning.  Remove & Discard patch within 12 hours or as directed by MD     lisinopril 20 MG tablet  Commonly known as:  PRINIVIL,ZESTRIL  Take 1 tablet (20 mg total) by mouth daily.     loratadine 10 MG tablet  Commonly known as:  CLARITIN  Take 10 mg by mouth daily.     metFORMIN 1000 MG tablet  Commonly known as:  GLUCOPHAGE  Take 1,000 mg by mouth 2 (two) times daily with a meal.     ondansetron 4 MG tablet  Commonly known as:  ZOFRAN   Take 4 mg by mouth every 8 (eight) hours as needed for nausea or vomiting.     pantoprazole 40 MG tablet  Commonly known as:  PROTONIX  Take 40 mg by mouth 2 (two) times daily.     polyethylene  glycol packet  Commonly known as:  MIRALAX / GLYCOLAX  Take 17 g by mouth daily.     potassium chloride SA 20 MEQ tablet  Commonly known as:  K-DUR,KLOR-CON  Take 20 mEq by mouth daily.     pravastatin 20 MG tablet  Commonly known as:  PRAVACHOL  Take 20 mg by mouth at bedtime.     sertraline 50 MG tablet  Commonly known as:  ZOLOFT  Take 50 mg by mouth at bedtime.     SYSTANE OP  Place 1 drop into both eyes 4 (four) times daily.     Travoprost (BAK Free) 0.004 % Soln ophthalmic solution  Commonly known as:  TRAVATAN  Place 2 drops into both eyes at bedtime.     traZODone 50 MG tablet  Commonly known as:  DESYREL  Take 100 mg by mouth at bedtime.     vitamin B-12 500 MCG tablet  Commonly known as:  CYANOCOBALAMIN  Take 500 mcg by mouth daily.     vitamin C 500 MG tablet  Commonly known as:  ASCORBIC ACID  Take 500 mg by mouth daily.     VOLTAREN 1 % Gel  Generic drug:  diclofenac sodium  Apply 2 g topically 2 (two) times daily as needed (left neck pain).       Allergies  Allergen Reactions  . Aspirin Other (See Comments)    G.I. Upset only      The results of significant diagnostics from this hospitalization (including imaging, microbiology, ancillary and laboratory) are listed below for reference.    Significant Diagnostic Studies: Ct Abdomen Pelvis Wo Contrast  07/24/2013   CLINICAL DATA:  Abdominal pain nausea and vomiting  EXAM: CT ABDOMEN AND PELVIS WITHOUT CONTRAST  TECHNIQUE: Multidetector CT imaging of the abdomen and pelvis was performed following the standard protocol without intravenous contrast.  COMPARISON:  DG ABD ACUTE W/CHEST dated 07/24/2013; CT ABD/PELVIS W CM dated 05/23/2013  FINDINGS: The rectosigmoid colon is decompressed with mild wall  thickening measuring 5 mm but no surrounding stranding or dilatation. Small bowel caliber is normal. Streak artifact from left total hip arthroplasty obscures detail of the pelvis. No free air or fluid. No lymphadenopathy.  Trace bilateral pleural effusions with associated compressive atelectasis noted. Cardiomegaly is present.  Cholecystectomy clips are noted. Unenhanced liver, adrenal glands, spleen, and pancreas are normal. Horseshoe kidney configuration reidentified with vascular calcifications and vascular calcification or nonobstructing left renal moiety 3 mm calculus image 41.  Moderate to severe atheromatous aortic calcification without aneurysm. The bladder is mildly thick walled, 5 mm. No radiopaque ureteral or bladder calculus is identified. The patient is rotated. Multilevel disc degenerative changes identified but no acute osseous finding is present. No compression deformity.  IMPRESSION: Rectosigmoid mild bowel wall thickening to 5 mm without surrounding stranding, which may be at upper limits of normal given the degree of collapse. However, early colitis could have this appearance. Bowel ischemia is possible. No evidence for bowel obstruction.  Mild bladder wall thickening, correlate with urinalysis for evidence of urinary tract infection.   Electronically Signed   By: Conchita Paris M.D.   On: 07/24/2013 09:29   Dg Abd 1 View  07/26/2013   CLINICAL DATA:  Abdominal pain and tenderness to palpation in the lower abdomen on physical examination.  EXAM: ABDOMEN - 1 VIEW  COMPARISON:  CT ABD/PELV WO CM dated 07/24/2013; DG ABD ACUTE W/CHEST dated 07/24/2013; DG ABD 2 VIEWS dated 05/29/2013; DG ABD 2 VIEWS dated  05/27/2013  FINDINGS: Gaseous distention of the sigmoid colon to the level of the rectosigmoid junction, corresponding to the site of the collapsed distal sigmoid on the CT 2 days ago. Gas within upper normal ascending colon, transverse colon, and descending colon. This has improved since the  acute abdomen series 2 days ago. Gas in multiple nondistended loops of small bowel. No suggestion of free air on the supine image. Surgical clips in the right upper quadrant from prior cholecystectomy. Aorto iliac atherosclerosis without aneurysm.  IMPRESSION: Improvement in the bowel gas pattern since the examination 2 days ago. Persistent moderate gaseous distention of the sigmoid colon to the level of the rectosigmoid junction (corresponding to the level of the collapsed segment on the CT 2 days ago with borderline thickened wall). Remainder of the colon now normal in caliber.   Electronically Signed   By: Evangeline Dakin M.D.   On: 07/26/2013 22:02   Dg Abd Acute W/chest  07/24/2013   CLINICAL DATA:  Vomiting, abdominal pain.  EXAM: ACUTE ABDOMEN SERIES (ABDOMEN 2 VIEW & CHEST 1 VIEW)  COMPARISON:  05/29/2013 and multiple other Plain films. CT 05/23/2013.  FINDINGS: Cardiomegaly. Lungs are clear. No effusions or acute bony abnormality.  Diffuse gaseous distention of the colon, dilated sigmoid colon. Cannot exclude recurrent sigmoid volvulus. No free air. No organomegaly. Prior cholecystectomy.  IMPRESSION: Diffuse gaseous distention of the colon with most pronounced gaseous distention in the sigmoid colon. Cannot exclude recurrent sigmoid volvulus.   Electronically Signed   By: Rolm Baptise M.D.   On: 07/24/2013 04:23   Dg Abd Portable 1v  07/27/2013   CLINICAL DATA:  Abdominal pain.  EXAM: PORTABLE ABDOMEN - 1 VIEW  COMPARISON:  07/26/2013  FINDINGS: Diffuse gaseous distention of the colon is again seen to the rectosigmoid. The shows no significant change since prior exam. No evidence of dilated small bowel loops. Right upper quadrant surgical clips again seen as well as diffuse atherosclerotic calcification of aorta and iliac arteries.  IMPRESSION: Diffuse gaseous distention of colon, without significant change.   Electronically Signed   By: Earle Gell M.D.   On: 07/27/2013 10:12     Microbiology: Recent Results (from the past 240 hour(s))  URINE CULTURE     Status: None   Collection Time    07/24/13  3:54 AM      Result Value Ref Range Status   Specimen Description URINE, CATHETERIZED   Final   Special Requests NONE   Final   Culture  Setup Time     Final   Value: 07/24/2013 08:33     Performed at [facility]     Final   Value: >=100,000 COLONIES/ML     Performed at [facility]   Culture     Final   Value: ESCHERICHIA COLI     Note: Confirmed Extended Spectrum Beta-Lactamase Producer (ESBL)     Performed at [facility]   Report Status 07/27/2013 FINAL   Final   Organism ID, Bacteria ESCHERICHIA COLI   Final  CULTURE, BLOOD (ROUTINE X 2)     Status: None   Collection Time    07/24/13  8:40 AM      Result Value Ref Range Status   Specimen Description BLOOD LEFT HAND   Final   Special Requests BOTTLES DRAWN AEROBIC AND ANAEROBIC 3CC   Final   Culture  Setup Time     Final   Value: 07/24/2013 09:57  Performed at [facility]     Final   Value:        BLOOD CULTURE RECEIVED NO GROWTH TO DATE CULTURE WILL BE HELD FOR 5 DAYS BEFORE ISSUING A FINAL NEGATIVE REPORT     Performed at [facility]   Report Status PENDING   Incomplete  MRSA PCR SCREENING     Status: None   Collection Time    07/24/13  5:29 PM      Result Value Ref Range Status   MRSA by PCR NEGATIVE  NEGATIVE Final   Comment:            The GeneXpert MRSA Assay (FDA     approved for NASAL specimens     only), is one component of a     comprehensive MRSA colonization     surveillance program. It is not     intended to diagnose MRSA     infection nor to guide or     monitor treatment for     MRSA infections.     Labs: Basic Metabolic Panel:  Recent Labs Lab 07/24/13 0422 07/24/13 2028 07/25/13 0239 07/26/13 0527 07/27/13 0400  NA 138 143 142 143 144  K 4.6 4.8 5.0 4.0 3.7  CL 103 114* 114* 113* 114*  CO2  19 16* 17* 17* 18*  GLUCOSE 164* 106* 123* 164* 156*  BUN 40* 33* 30* 16 12  CREATININE 1.97* 1.62* 1.45* 0.87 0.81  CALCIUM 9.3 8.1* 8.2* 8.9 9.0   Liver Function Tests:  Recent Labs Lab 07/24/13 0422 07/24/13 2028 07/26/13 0527  AST 14 45* 31  ALT 8 37* 29  ALKPHOS 75 61 73  BILITOT <0.2* <0.2* 0.3  PROT 6.8 6.0 6.7  ALBUMIN 3.6 3.0* 3.3*    Recent Labs Lab 07/24/13 0422  LIPASE 31   No results found for this basename: AMMONIA,  in the last 168 hours CBC:  Recent Labs Lab 07/24/13 0422 07/24/13 2028 07/25/13 0239 07/26/13 0527 07/27/13 0400  WBC 7.6 5.0 5.1 6.2 6.0  NEUTROABS 6.0 3.1  --   --   --   HGB 9.0* 8.9* 8.6* 9.0* 8.8*  HCT 27.9* 27.0* 26.7* 27.9* 25.7*  MCV 104.5* 104.2* 104.3* 102.6* 101.2*  PLT 118* 111* 113* 115* 109*   Cardiac Enzymes:  Recent Labs Lab 07/24/13 0710 07/24/13 2028  TROPONINI <0.30 <0.30   CBG:  Recent Labs Lab 07/27/13 0824 07/27/13 1131 07/27/13 1722 07/27/13 2122 07/28/13 0758  GLUCAP 140* 140* 138* 117* 143*       Signed:  Ruben Im PA-S [facility] 07/28/2013, 9:50 AM    I have directly reviewed the clinical findings, lab, imaging studies and management of this patient in detail. I have interviewed and examined the patient and agree with the documentation,  as recorded by the Physician extender/student.  Thurnell Lose M.D on 07/28/2013 at 11:29 AM  [facility] Office  [phone]

## 2013-07-28 NOTE — Clinical Social Work Placement (Signed)
Clinical Social Work Department CLINICAL SOCIAL WORK PLACEMENT NOTE 07/28/2013  Patient:  Maria Christian, Maria Christian  Account Number:  [id number] Admit date:  07/24/2013  Clinical Social Worker:  Kemper Durie, [place]  Date/time:  07/28/2013 10:00 AM  Clinical Social Work is seeking post-discharge placement for this patient at the following level of care:   [facility]   (*CSW will update this form in Epic as items are completed)   07/28/2013  Patient/family provided with [facility] Department of Clinical Social Work's list of facilities offering this level of care within the geographic area requested by the patient (or if unable, by the patient's family).  07/28/2013  Patient/family informed of their freedom to choose among providers that offer the needed level of care, that participate in Medicare, Medicaid or managed care program needed by the patient, have an available bed and are willing to accept the patient.  07/28/2013  Patient/family informed of [facility]' ownership interest in [facility], as well as of the fact that they are under no obligation to receive care at this facility.  PASARR submitted to EDS on  PASARR number received from [facility] on   FL2 transmitted to all facilities in geographic area requested by pt/family on  07/28/2013 FL2 transmitted to all facilities within larger geographic area on   Patient informed that his/her managed care company has contracts with or will negotiate with  certain facilities, including the following:     Patient/family informed of bed offers received:  07/28/2013 Patient chooses bed at [facility], [place] Physician recommends and patient chooses bed at    Patient to be transferred to [facility], [place] on  07/28/2013 Patient to be transferred to facility by Ambulance  The following physician request were entered in Epic:   Additional Comments: Per MD patient ready to Dc to [facility]. RN,  son, and facility notified of DC. Rn given number for report. Dc packet on chart. Ambulance transport requested for patient. CSW signing off.   Liz Beach, [place], [place], [phone]

## 2013-07-28 NOTE — Progress Notes (Signed)
RN is not sure if doctors are aware that patient's right lower leg appears larger in size then left. RN assessed bilateral legs and they both are warm to touch and color is appropriate for patient. RN is going to notify on call MD. Will continue to monitor

## 2013-07-28 NOTE — Progress Notes (Signed)
Patient was transferred to a nursing skilled facility ([facility]) by MD order; discharged instructions and care notes was sent with patient to facility; IV DIC; skin intact; patient will be transported  to [facility] via EMS.

## 2013-07-28 NOTE — Progress Notes (Signed)
Patient ID: Maria Christian, female   DOB: Aug 22, 1924, 80 y.o.   MRN: [id number]    Subjective: Pt can't hear.  She has just voided on herself.  Has no c/o  Objective: Vital signs in last 24 hours: Temp:  [98.3 F (36.8 C)-98.6 F (37 C)] 98.5 F (36.9 C) (02/23 0500) Pulse Rate:  [74-91] 84 (02/23 0500) Resp:  [16-20] 16 (02/23 0500) BP: (136-154)/(74-79) 142/79 mmHg (02/23 0500) SpO2:  [95 %-96 %] 96 % (02/23 0500) Weight:  [172 lb 8 oz (78.245 kg)] 172 lb 8 oz (78.245 kg) (02/23 0425) Last BM Date: 07/28/13  Intake/Output from previous day: 02/22 0701 - 02/23 0700 In: 234.3 [I.V.:234.3] Out: -  Intake/Output this shift:    PE: Abd: soft, distended, few BS, seems NT  Lab Results:   Recent Labs  07/26/13 0527 07/27/13 0400  WBC 6.2 6.0  HGB 9.0* 8.8*  HCT 27.9* 25.7*  PLT 115* 109*   BMET  Recent Labs  07/26/13 0527 07/27/13 0400  NA 143 144  K 4.0 3.7  CL 113* 114*  CO2 17* 18*  GLUCOSE 164* 156*  BUN 16 12  CREATININE 0.87 0.81  CALCIUM 8.9 9.0   PT/INR No results found for this basename: LABPROT, INR,  in the last 72 hours CMP     Component Value Date/Time   NA 144 07/27/2013 0400   K 3.7 07/27/2013 0400   CL 114* 07/27/2013 0400   CO2 18* 07/27/2013 0400   GLUCOSE 156* 07/27/2013 0400   BUN 12 07/27/2013 0400   CREATININE 0.81 07/27/2013 0400   CALCIUM 9.0 07/27/2013 0400   PROT 6.7 07/26/2013 0527   ALBUMIN 3.3* 07/26/2013 0527   AST 31 07/26/2013 0527   ALT 29 07/26/2013 0527   ALKPHOS 73 07/26/2013 0527   BILITOT 0.3 07/26/2013 0527   GFRNONAA 64* 07/27/2013 0400   GFRAA 74* 07/27/2013 0400   Lipase     Component Value Date/Time   LIPASE 31 07/24/2013 0422       Studies/Results: Dg Abd 1 View  07/26/2013   CLINICAL DATA:  Abdominal pain and tenderness to palpation in the lower abdomen on physical examination.  EXAM: ABDOMEN - 1 VIEW  COMPARISON:  CT ABD/PELV WO CM dated 07/24/2013; DG ABD ACUTE W/CHEST dated 07/24/2013; DG ABD 2 VIEWS dated  05/29/2013; DG ABD 2 VIEWS dated 05/27/2013  FINDINGS: Gaseous distention of the sigmoid colon to the level of the rectosigmoid junction, corresponding to the site of the collapsed distal sigmoid on the CT 2 days ago. Gas within upper normal ascending colon, transverse colon, and descending colon. This has improved since the acute abdomen series 2 days ago. Gas in multiple nondistended loops of small bowel. No suggestion of free air on the supine image. Surgical clips in the right upper quadrant from prior cholecystectomy. Aorto iliac atherosclerosis without aneurysm.  IMPRESSION: Improvement in the bowel gas pattern since the examination 2 days ago. Persistent moderate gaseous distention of the sigmoid colon to the level of the rectosigmoid junction (corresponding to the level of the collapsed segment on the CT 2 days ago with borderline thickened wall). Remainder of the colon now normal in caliber.   Electronically Signed   By: Evangeline Dakin M.D.   On: 07/26/2013 22:02   Dg Abd Portable 1v  07/27/2013   CLINICAL DATA:  Abdominal pain.  EXAM: PORTABLE ABDOMEN - 1 VIEW  COMPARISON:  07/26/2013  FINDINGS: Diffuse gaseous distention of the colon is again seen  to the rectosigmoid. The shows no significant change since prior exam. No evidence of dilated small bowel loops. Right upper quadrant surgical clips again seen as well as diffuse atherosclerotic calcification of aorta and iliac arteries.  IMPRESSION: Diffuse gaseous distention of colon, without significant change.   Electronically Signed   By: Earle Gell M.D.   On: 07/27/2013 10:12    Anti-infectives: Anti-infectives   Start     Dose/Rate Route Frequency Ordered Stop   07/25/13 1845  cefTRIAXone (ROCEPHIN) 1 g in dextrose 5 % 50 mL IVPB     1 g 100 mL/hr over 30 Minutes Intravenous Every 24 hours 07/25/13 1807     07/25/13 0800  vancomycin (VANCOCIN) IVPB 750 mg/150 ml premix  Status:  Discontinued     750 mg 150 mL/hr over 60 Minutes  Intravenous Every 24 hours 07/24/13 0652 07/24/13 1021   07/24/13 2200  Ampicillin-Sulbactam (UNASYN) 3 g in sodium chloride 0.9 % 100 mL IVPB  Status:  Discontinued     3 g 100 mL/hr over 60 Minutes Intravenous Every 12 hours 07/24/13 1059 07/25/13 1807   07/24/13 1400  piperacillin-tazobactam (ZOSYN) IVPB 3.375 g  Status:  Discontinued     3.375 g 12.5 mL/hr over 240 Minutes Intravenous 3 times per day 07/24/13 0652 07/24/13 1021   07/24/13 1045  Ampicillin-Sulbactam (UNASYN) 3 g in sodium chloride 0.9 % 100 mL IVPB     3 g 100 mL/hr over 60 Minutes Intravenous STAT 07/24/13 1037 07/24/13 1223   07/24/13 0700  vancomycin (VANCOCIN) IVPB 1000 mg/200 mL premix  Status:  Discontinued     1,000 mg 200 mL/hr over 60 Minutes Intravenous  Once 07/24/13 0646 07/24/13 1037   07/24/13 0700  piperacillin-tazobactam (ZOSYN) IVPB 3.375 g  Status:  Discontinued     3.375 g 100 mL/hr over 30 Minutes Intravenous  Once 07/24/13 0646 07/24/13 1037   07/24/13 0430  ciprofloxacin (CIPRO) IVPB 400 mg     400 mg 200 mL/hr over 60 Minutes Intravenous  Once 07/24/13 0420 07/24/13 0553       Assessment/Plan  1. Recurrent intermittent sigmoid volvulus 2. Multiple medical problems  Plan: 1.  Patient appears to have re-volvulized on her plain film as well as clinically as she is more distended today than when I saw her on Friday. 2.  She is not a surgical candidate; however, if they family wants medical care (as outlined in palliative care note), you could consider GI consult for endoscopic decompression.  Nothing else to add surgically.  LOS: 4 days    Araya Roel E 07/28/2013, 10:05 AM Pager: [phone]

## 2013-07-28 NOTE — Care Management Note (Signed)
    Page 1 of 1   07/28/2013     5:25:29 PM   CARE MANAGEMENT NOTE 07/28/2013  Patient:  ARISA, [place]   Account Number:  [id number]  Date Initiated:  07/25/2013  Documentation initiated by:  [facility]  Subjective/Objective Assessment:   Tx from [place] - for SDU admission - from SNF vs AL??     Action/Plan:   Anticipated DC Date:  07/28/2013   Anticipated DC Plan:  SKILLED NURSING FACILITY  In-house referral  Clinical Social Worker      DC Planning Services  CM consult      Choice offered to / List presented to:             Status of service:  Completed, signed off Medicare Important Message given?   (If response is "NO", the following Medicare IM given date fields will be blank) Date Medicare IM given:   Date Additional Medicare IM given:    Discharge Disposition:  [place]  Per UR Regulation:  Reviewed for med. necessity/level of care/duration of stay  If discussed at [place] of Stay Meetings, dates discussed:    Comments:  ContactOliver Barre [place] Daughter [phone]  07/28/13 [place], BSN [phone] patient dc to [facility], CSW following.  07-25-13 3:20pm Luz Lex, RNBSN [phone]  notes state from facility  - last discharge to [facility] consult placed.

## 2013-07-28 NOTE — Progress Notes (Signed)
Patient to be discharge to SNF with the plan that if she develops more symptoms, will proceed with palliative measures per discussion with family.  Not a surgical candidate.  Maria Christian. Georgette Dover, MD, [facility] Surgery  General/ Trauma Surgery  07/28/2013 1:33 PM

## 2013-07-29 ENCOUNTER — Non-Acute Institutional Stay (SKILLED_NURSING_FACILITY): Payer: PRIVATE HEALTH INSURANCE | Admitting: Internal Medicine

## 2013-07-29 ENCOUNTER — Encounter: Payer: Self-pay | Admitting: Internal Medicine

## 2013-07-29 ENCOUNTER — Other Ambulatory Visit: Payer: Self-pay | Admitting: *Deleted

## 2013-07-29 DIAGNOSIS — I5032 Chronic diastolic (congestive) heart failure: Secondary | ICD-10-CM

## 2013-07-29 DIAGNOSIS — K562 Volvulus: Secondary | ICD-10-CM

## 2013-07-29 DIAGNOSIS — F0390 Unspecified dementia without behavioral disturbance: Secondary | ICD-10-CM

## 2013-07-29 DIAGNOSIS — I509 Heart failure, unspecified: Secondary | ICD-10-CM

## 2013-07-29 DIAGNOSIS — F329 Major depressive disorder, single episode, unspecified: Secondary | ICD-10-CM

## 2013-07-29 DIAGNOSIS — A498 Other bacterial infections of unspecified site: Secondary | ICD-10-CM

## 2013-07-29 DIAGNOSIS — E119 Type 2 diabetes mellitus without complications: Secondary | ICD-10-CM

## 2013-07-29 DIAGNOSIS — F32A Depression, unspecified: Secondary | ICD-10-CM

## 2013-07-29 DIAGNOSIS — Z515 Encounter for palliative care: Secondary | ICD-10-CM

## 2013-07-29 DIAGNOSIS — B9620 Unspecified Escherichia coli [E. coli] as the cause of diseases classified elsewhere: Secondary | ICD-10-CM

## 2013-07-29 DIAGNOSIS — F3289 Other specified depressive episodes: Secondary | ICD-10-CM

## 2013-07-29 DIAGNOSIS — N390 Urinary tract infection, site not specified: Secondary | ICD-10-CM

## 2013-07-29 DIAGNOSIS — D696 Thrombocytopenia, unspecified: Secondary | ICD-10-CM

## 2013-07-29 DIAGNOSIS — R627 Adult failure to thrive: Secondary | ICD-10-CM

## 2013-07-29 DIAGNOSIS — I10 Essential (primary) hypertension: Secondary | ICD-10-CM

## 2013-07-29 DIAGNOSIS — G20 Parkinson's disease: Secondary | ICD-10-CM

## 2013-07-29 DIAGNOSIS — I4891 Unspecified atrial fibrillation: Secondary | ICD-10-CM

## 2013-07-29 DIAGNOSIS — N179 Acute kidney failure, unspecified: Secondary | ICD-10-CM

## 2013-07-29 MED ORDER — ALPRAZOLAM 0.25 MG PO TABS: ORAL_TABLET | ORAL | Status: DC

## 2013-07-29 NOTE — Assessment & Plan Note (Signed)
Stable on no meds 

## 2013-07-29 NOTE — Assessment & Plan Note (Signed)
Continue aricept 

## 2013-07-29 NOTE — Assessment & Plan Note (Signed)
A1c 6.4 in 05/2013;hyperglycmia allowed to prevent hypoglycemia;restart glucophage

## 2013-07-29 NOTE — Assessment & Plan Note (Signed)
Continue Zoloft 

## 2013-07-29 NOTE — Assessment & Plan Note (Signed)
Sec to dehydration;resolved

## 2013-07-29 NOTE — Assessment & Plan Note (Signed)
intermiddent volvuluse;not surgical candidate so concentrate on comfort measures;pt's son and POA is aware

## 2013-07-29 NOTE — Assessment & Plan Note (Signed)
Mild, stable.  

## 2013-07-29 NOTE — Assessment & Plan Note (Signed)
Compensated;previously on ACE and lasix which resulted in dehydration and renal failure, now resoved so meds restarted;continue coreg

## 2013-07-29 NOTE — Progress Notes (Signed)
MRN: [id number] Name: Maria Christian  Sex: female Age: 80 y.o. DOB: [birth year]  [place] #: [name] Facility/Room:  220 Level Of Care: SNF Provider: Inocencio Homes D Emergency Contacts: Extended Emergency Contact Information Primary Emergency Contact: [name] [place] Phone: [phone] Relation: Son Secondary Emergency Contact: Roberts,Lynn Address: [address]          [place], [place] [address] [place] Phone: [phone] Mobile Phone: [phone] Relation: Daughter  Code Status: FULL  Allergies: Aspirin  Chief Complaint  Patient presents with  . nursing home admission    HPI: Patient is 80 y.o. female who is to become full time resident after being in hospital with UTI, dehydration and volvulus. Pt has dementia.  Past Medical History  Diagnosis Date  . Atrial fibrillation   . Altered mental status   . Encephalopathy   . Parkinson disease   . Hypertension   . GERD (gastroesophageal reflux disease)   . Hyperlipemia   . Diabetes mellitus   . Coronary artery disease   . History of adenomatous polyp of colon 06/24/99  . Enlarged heart   . DEMENTIA   . Edema of lower extremity 07/13/11    right leg more swollen than left leg  . Esophageal dysmotility 07/02/12  . Horseshoe kidney   . Pancreatic lesion 05/22/11    no further workup  per PCP/family due to age  . Anemia   . Peripheral neuropathy   . Depression   . Thrombophlebitis     Past Surgical History  Procedure Laterality Date  . Shoulder surgery  2001    left clavicle excision and acromioplasty  . Abdominal hysterectomy    . Cholecystectomy    . Total hip arthroplasty    . Breast surgery      2 benign tumors removed left breast  . Foot surgery      benign tumors from foot  . Esophageal dilation      several times by Dr. [name]  . Nose surgery    . Esophagogastroduodenoscopy (egd) with esophageal dilation N/A 08/01/2012    Procedure:  ESOPHAGOGASTRODUODENOSCOPY (EGD) WITH ESOPHAGEAL DILATION;  Surgeon: [name], MD;  Location: [facility] ENDOSCOPY;  Service: Endoscopy;  Laterality: N/A;  with c-arm savory dilators  . Flexible sigmoidoscopy N/A 05/24/2013    Procedure: FLEXIBLE SIGMOIDOSCOPY;  Surgeon: [name], MD;  Location: [facility] ENDOSCOPY;  Service: Endoscopy;  Laterality: N/A;  . Flexible sigmoidoscopy N/A 05/26/2013    Procedure:  flex with decompression of sigmoid volvulus;  Surgeon: [name], MD;  Location: [facility] ENDOSCOPY;  Service: Endoscopy;  Laterality: N/A;      Medication List       This list is accurate as of: 07/29/13  4:30 PM.  Always use your most recent med list.               acetaminophen 325 MG tablet  Commonly known as:  TYLENOL  Take 650 mg by mouth 2 (two) times daily. For OA.     ALPRAZolam 0.25 MG tablet  Commonly known as:  XANAX  Take one tablet by mouth once daily at 2pm; Take one tablet by mouth at bedtime as needed; Take one tablet by mouth every 12 hours as needed     amLODipine 5 MG tablet  Commonly known as:  NORVASC  Take 5 mg by mouth daily.     aspirin 81 MG chewable tablet  Chew 81 mg by mouth daily.  carvedilol 3.125 MG tablet  Commonly known as:  COREG  Take 3.125 mg by mouth daily.     CERTAGEN PO  Take 1 tablet by mouth daily.     cholecalciferol 1000 UNITS tablet  Commonly known as:  VITAMIN D  Take 1,000 Units by mouth daily.     clopidogrel 75 MG tablet  Commonly known as:  PLAVIX  Take 75 mg by mouth daily.     Cranberry 475 MG Caps  Take 1 capsule by mouth 2 (two) times daily.     dextromethorphan-guaiFENesin 30-600 MG per 12 hr tablet  Commonly known as:  MUCINEX DM  Take 1 tablet by mouth every 12 (twelve) hours.     docusate sodium 100 MG capsule  Commonly known as:  COLACE  Take 1 capsule (100 mg total) by mouth 2 (two) times daily.     donepezil 5 MG tablet  Commonly known as:  ARICEPT  Take 5 mg by mouth at bedtime.      dorzolamide 2 % ophthalmic solution  Commonly known as:  TRUSOPT  Place 1 drop into both eyes 3 (three) times daily.     feeding supplement (ENSURE COMPLETE) Liqd  Take 237 mLs by mouth 2 (two) times daily between meals.     ferrous sulfate 325 (65 FE) MG tablet  Take 325 mg by mouth daily with breakfast.     furosemide 40 MG tablet  Commonly known as:  LASIX  Take 40 mg by mouth daily.     gabapentin 300 MG capsule  Commonly known as:  NEURONTIN  Take 300 mg by mouth 2 (two) times daily.     ibuprofen 400 MG tablet  Commonly known as:  ADVIL,MOTRIN  Take 400 mg by mouth daily at 12 noon.     ketotifen 0.025 % ophthalmic solution  Commonly known as:  ZADITOR  Place 1 drop into both eyes 2 (two) times daily.     lactulose 10 GM/15ML solution  Commonly known as:  CHRONULAC  Take 30 g by mouth 2 (two) times daily.     lidocaine 5 %  Commonly known as:  LIDODERM  Place 1 patch onto the skin daily. Apply 1 patch to left lower back/hip every morning.  Remove & Discard patch within 12 hours or as directed by MD     lisinopril 20 MG tablet  Commonly known as:  PRINIVIL,ZESTRIL  Take 1 tablet (20 mg total) by mouth daily.     loratadine 10 MG tablet  Commonly known as:  CLARITIN  Take 10 mg by mouth daily.     metFORMIN 1000 MG tablet  Commonly known as:  GLUCOPHAGE  Take 1,000 mg by mouth 2 (two) times daily with a meal.     ondansetron 4 MG tablet  Commonly known as:  ZOFRAN  Take 4 mg by mouth every 8 (eight) hours as needed for nausea or vomiting.     pantoprazole 40 MG tablet  Commonly known as:  PROTONIX  Take 40 mg by mouth 2 (two) times daily.     polyethylene glycol packet  Commonly known as:  MIRALAX / GLYCOLAX  Take 17 g by mouth daily.     potassium chloride SA 20 MEQ tablet  Commonly known as:  K-DUR,KLOR-CON  Take 20 mEq by mouth daily.     pravastatin 20 MG tablet  Commonly known as:  PRAVACHOL  Take 20 mg by mouth at bedtime.     sertraline 50  MG  tablet  Commonly known as:  ZOLOFT  Take 50 mg by mouth at bedtime.     SYSTANE OP  Place 1 drop into both eyes 4 (four) times daily.     Travoprost (BAK Free) 0.004 % Soln ophthalmic solution  Commonly known as:  TRAVATAN  Place 2 drops into both eyes at bedtime.     traZODone 50 MG tablet  Commonly known as:  DESYREL  Take 100 mg by mouth at bedtime.     vitamin B-12 500 MCG tablet  Commonly known as:  CYANOCOBALAMIN  Take 500 mcg by mouth daily.     vitamin C 500 MG tablet  Commonly known as:  ASCORBIC ACID  Take 500 mg by mouth daily.     VOLTAREN 1 % Gel  Generic drug:  diclofenac sodium  Apply 2 g topically 2 (two) times daily as needed (left neck pain).        No orders of the defined types were placed in this encounter.     There is no immunization history on file for this patient.  History  Substance Use Topics  . Smoking status: Former Research scientist (life sciences)  . Smokeless tobacco: Never Used  . Alcohol Use: No    Family history is noncontributory    Review of Systems  DATA OBTAINED: from patient; PT HAS NO C/O EXCEPT SHE WANTS HER CLOTHES GENERAL: Feels well no fevers, fatigue, appetite changes SKIN: No itching, rash or wounds EYES: No eye pain, redness, discharge EARS: No earache, tinnitus, change in hearing NOSE: No congestion, drainage or bleeding  MOUTH/THROAT: No mouth or tooth pain, No sore throat, No difficulty chewing or swallowing  RESPIRATORY: No cough, wheezing, SOB CARDIAC: No chest pain, palpitations, lower extremity edema  GI: No abdominal pain, No N/V/D or constipation, No heartburn or reflux  GU: No dysuria, frequency or urgency, or incontinence  MUSCULOSKELETAL: No unrelieved bone/joint pain NEUROLOGIC: No headache, dizziness or focal weakness PSYCHIATRIC: No overt anxiety or sadness. Sleeps well. No behavior issue.   Filed Vitals:   07/29/13 1603  BP: 147/68  Pulse: 77  Temp: 98.3 F (36.8 C)  Resp: 20    Physical Exam  GENERAL  APPEARANCE: Alert, conversant. Appropriately groomed. No acute distress.  SKIN: No diaphoresis rash HEAD: Normocephalic, atraumatic  EYES: Conjunctiva/lids clear. Pupils round, reactive. EOMs intact.  EARS: External exam WNL, canals clear. Hearing grossly normal.  NOSE: No deformity or discharge.  MOUTH/THROAT: Lips w/o lesions RESPIRATORY: Breathing is even, unlabored. Lung sounds are clear   CARDIOVASCULAR: Heart RRR no murmurs, rubs or gallops. No peripheral edema.  GASTROINTESTINAL: Abdomen is soft, non-tender, not distended w/ normal bowel sounds GENITOURINARY: Bladder non tender, not distended  MUSCULOSKELETAL: No abnormal joints or musculature NEUROLOGIC: Oriented X2. Cranial nerves 2-12 grossly intact. Moves all extremities no tremor. PSYCHIATRIC: Mood and affect appropriate to situation, no behavioral issues  Patient Active Problem List   Diagnosis Date Noted  . FTT (failure to thrive) in adult 07/29/2013  . Dehydration 07/25/2013  . Palliative care encounter 07/25/2013  . Weakness generalized 07/25/2013  . Hypotension 07/24/2013  . ARF (acute renal failure) 07/24/2013  . Volvulus of sigmoid colon 05/26/2013  . Pain in right shoulder joint 05/25/2013  . Sigmoid volvulus 05/24/2013  . Pulmonary hypertension 05/24/2013  . Sacral decubitus ulcer 05/24/2013  . HOH (hard of hearing) 05/24/2013  . TIA (transient ischemic attack) 12/19/2012  . Dementia 12/19/2012  . Insomnia 12/19/2012  . Depression 12/19/2012  . Constipation 12/19/2012  . Pancreatic  mass 07/17/2011  . Dysphagia 07/17/2011  . Thrombocytopenia 07/16/2011  . E. coli UTI 07/13/2011  . History of adenomatous polyp of colon 07/02/2011  . Preventative health care 07/01/2011  . Parkinson disease   . Hypertension   . GERD (gastroesophageal reflux disease)   . Hyperlipemia   . Diabetes mellitus   . Coronary artery disease   . Atrial fibrillation with controlled ventricular response 05/24/2011  . Diastolic CHF,  chronic 123456    CBC    Component Value Date/Time   WBC 6.0 07/27/2013 0400   RBC 2.54* 07/27/2013 0400   HGB 8.8* 07/27/2013 0400   HCT 25.7* 07/27/2013 0400   PLT 109* 07/27/2013 0400   MCV 101.2* 07/27/2013 0400   LYMPHSABS 1.2 07/24/2013 2028   MONOABS 0.5 07/24/2013 2028   EOSABS 0.2 07/24/2013 2028   BASOSABS 0.0 07/24/2013 2028    CMP     Component Value Date/Time   NA 144 07/27/2013 0400   K 3.7 07/27/2013 0400   CL 114* 07/27/2013 0400   CO2 18* 07/27/2013 0400   GLUCOSE 156* 07/27/2013 0400   BUN 12 07/27/2013 0400   CREATININE 0.81 07/27/2013 0400   CALCIUM 9.0 07/27/2013 0400   PROT 6.7 07/26/2013 0527   ALBUMIN 3.3* 07/26/2013 0527   AST 31 07/26/2013 0527   ALT 29 07/26/2013 0527   ALKPHOS 73 07/26/2013 0527   BILITOT 0.3 07/26/2013 0527   GFRNONAA 64* 07/27/2013 0400   GFRAA 74* 07/27/2013 0400    Assessment and Plan  Atrial fibrillation with controlled ventricular response Stable and not a candidate for anticoagulation; continue coreg  Diastolic CHF, chronic Compensated;previously on ACE and lasix which resulted in dehydration and renal failure, now resoved so meds restarted;continue coreg  Hypertension Moderately controlled in hospital;restart norvasc and continue coreg  Diabetes mellitus A1c 6.4 in 05/2013;hyperglycmia allowed to prevent hypoglycemia;restart glucophage  Sigmoid volvulus intermiddent volvuluse;not surgical candidate so concentrate on comfort measures;pt's son and POA is aware  Parkinson disease Stable on no meds  Dementia Continue aricept  E. coli UTI Resolved with IV rocephin  ARF (acute renal failure) Sec to dehydration;resolved  Palliative care encounter Pt was DNR in hospital but is full code here at SNF  Thrombocytopenia Mild,stable  Depression Continue Zoloft  FTT (failure to thrive) in adult encoutrage po's; pt to start PT    Hennie Duos, MD

## 2013-07-29 NOTE — Telephone Encounter (Signed)
[facility] [place] 

## 2013-07-29 NOTE — Assessment & Plan Note (Signed)
Pt was DNR in hospital but is full code here at [facility]

## 2013-07-29 NOTE — Assessment & Plan Note (Signed)
Resolved with IV rocephin

## 2013-07-29 NOTE — Assessment & Plan Note (Signed)
encoutrage po's; pt to start PT

## 2013-07-29 NOTE — Assessment & Plan Note (Signed)
Moderately controlled in hospital;restart norvasc and continue coreg

## 2013-07-29 NOTE — Assessment & Plan Note (Signed)
Stable and not a candidate for anticoagulation; continue coreg

## 2013-07-30 LAB — CULTURE, BLOOD (ROUTINE X 2): Culture: NO GROWTH

## 2013-08-01 NOTE — Progress Notes (Signed)
This encounter was created in error - please disregard.

## 2013-08-03 DIAGNOSIS — I10 Essential (primary) hypertension: Secondary | ICD-10-CM | POA: Diagnosis not present

## 2013-08-03 DIAGNOSIS — F0280 Dementia in other diseases classified elsewhere without behavioral disturbance: Secondary | ICD-10-CM | POA: Diagnosis present

## 2013-08-03 DIAGNOSIS — D696 Thrombocytopenia, unspecified: Secondary | ICD-10-CM | POA: Diagnosis not present

## 2013-08-03 DIAGNOSIS — R197 Diarrhea, unspecified: Secondary | ICD-10-CM | POA: Diagnosis not present

## 2013-08-03 DIAGNOSIS — I509 Heart failure, unspecified: Secondary | ICD-10-CM | POA: Diagnosis not present

## 2013-08-03 DIAGNOSIS — E46 Unspecified protein-calorie malnutrition: Secondary | ICD-10-CM | POA: Diagnosis not present

## 2013-08-03 DIAGNOSIS — M62838 Other muscle spasm: Secondary | ICD-10-CM | POA: Diagnosis not present

## 2013-08-03 DIAGNOSIS — A0472 Enterocolitis due to Clostridium difficile, not specified as recurrent: Secondary | ICD-10-CM | POA: Diagnosis present

## 2013-08-03 DIAGNOSIS — R3989 Other symptoms and signs involving the genitourinary system: Secondary | ICD-10-CM | POA: Diagnosis present

## 2013-08-03 DIAGNOSIS — E119 Type 2 diabetes mellitus without complications: Secondary | ICD-10-CM | POA: Diagnosis present

## 2013-08-03 DIAGNOSIS — K562 Volvulus: Secondary | ICD-10-CM | POA: Diagnosis not present

## 2013-08-03 DIAGNOSIS — K56609 Unspecified intestinal obstruction, unspecified as to partial versus complete obstruction: Secondary | ICD-10-CM | POA: Diagnosis present

## 2013-08-03 DIAGNOSIS — O10019 Pre-existing essential hypertension complicating pregnancy, unspecified trimester: Secondary | ICD-10-CM | POA: Diagnosis not present

## 2013-08-03 DIAGNOSIS — N189 Chronic kidney disease, unspecified: Secondary | ICD-10-CM | POA: Diagnosis present

## 2013-08-03 DIAGNOSIS — Z87891 Personal history of nicotine dependence: Secondary | ICD-10-CM | POA: Diagnosis not present

## 2013-08-03 DIAGNOSIS — B351 Tinea unguium: Secondary | ICD-10-CM | POA: Diagnosis not present

## 2013-08-03 DIAGNOSIS — N179 Acute kidney failure, unspecified: Secondary | ICD-10-CM | POA: Diagnosis not present

## 2013-08-03 DIAGNOSIS — E440 Moderate protein-calorie malnutrition: Secondary | ICD-10-CM | POA: Diagnosis present

## 2013-08-03 DIAGNOSIS — E870 Hyperosmolality and hypernatremia: Secondary | ICD-10-CM | POA: Diagnosis present

## 2013-08-03 DIAGNOSIS — Z1331 Encounter for screening for depression: Secondary | ICD-10-CM | POA: Diagnosis not present

## 2013-08-03 DIAGNOSIS — I2510 Atherosclerotic heart disease of native coronary artery without angina pectoris: Secondary | ICD-10-CM | POA: Diagnosis not present

## 2013-08-03 DIAGNOSIS — F0390 Unspecified dementia without behavioral disturbance: Secondary | ICD-10-CM | POA: Diagnosis not present

## 2013-08-03 DIAGNOSIS — I4891 Unspecified atrial fibrillation: Secondary | ICD-10-CM | POA: Diagnosis not present

## 2013-08-03 DIAGNOSIS — Z96649 Presence of unspecified artificial hip joint: Secondary | ICD-10-CM | POA: Diagnosis not present

## 2013-08-03 DIAGNOSIS — K219 Gastro-esophageal reflux disease without esophagitis: Secondary | ICD-10-CM | POA: Diagnosis present

## 2013-08-03 DIAGNOSIS — G20 Parkinson's disease: Secondary | ICD-10-CM | POA: Diagnosis not present

## 2013-08-03 DIAGNOSIS — K560 Paralytic ileus: Secondary | ICD-10-CM | POA: Diagnosis present

## 2013-08-03 DIAGNOSIS — R1110 Vomiting, unspecified: Secondary | ICD-10-CM | POA: Diagnosis not present

## 2013-08-03 DIAGNOSIS — R109 Unspecified abdominal pain: Secondary | ICD-10-CM | POA: Diagnosis not present

## 2013-08-03 DIAGNOSIS — E1142 Type 2 diabetes mellitus with diabetic polyneuropathy: Secondary | ICD-10-CM | POA: Diagnosis not present

## 2013-08-03 DIAGNOSIS — I5032 Chronic diastolic (congestive) heart failure: Secondary | ICD-10-CM | POA: Diagnosis not present

## 2013-08-03 DIAGNOSIS — Z515 Encounter for palliative care: Secondary | ICD-10-CM | POA: Diagnosis not present

## 2013-08-03 DIAGNOSIS — R51 Headache: Secondary | ICD-10-CM | POA: Diagnosis not present

## 2013-08-03 DIAGNOSIS — D539 Nutritional anemia, unspecified: Secondary | ICD-10-CM | POA: Diagnosis present

## 2013-08-03 DIAGNOSIS — Z5189 Encounter for other specified aftercare: Secondary | ICD-10-CM | POA: Diagnosis not present

## 2013-08-03 DIAGNOSIS — K5989 Other specified functional intestinal disorders: Secondary | ICD-10-CM | POA: Diagnosis present

## 2013-08-03 DIAGNOSIS — I5031 Acute diastolic (congestive) heart failure: Secondary | ICD-10-CM | POA: Diagnosis not present

## 2013-08-03 DIAGNOSIS — G3183 Dementia with Lewy bodies: Secondary | ICD-10-CM | POA: Diagnosis present

## 2013-08-03 DIAGNOSIS — IMO0002 Reserved for concepts with insufficient information to code with codable children: Secondary | ICD-10-CM | POA: Diagnosis not present

## 2013-08-03 DIAGNOSIS — M79609 Pain in unspecified limb: Secondary | ICD-10-CM | POA: Diagnosis not present

## 2013-08-03 DIAGNOSIS — R4182 Altered mental status, unspecified: Secondary | ICD-10-CM | POA: Diagnosis present

## 2013-08-03 DIAGNOSIS — E785 Hyperlipidemia, unspecified: Secondary | ICD-10-CM | POA: Diagnosis present

## 2013-08-03 DIAGNOSIS — I9589 Other hypotension: Secondary | ICD-10-CM | POA: Diagnosis not present

## 2013-08-03 DIAGNOSIS — I129 Hypertensive chronic kidney disease with stage 1 through stage 4 chronic kidney disease, or unspecified chronic kidney disease: Secondary | ICD-10-CM | POA: Diagnosis present

## 2013-08-03 DIAGNOSIS — D6489 Other specified anemias: Secondary | ICD-10-CM | POA: Diagnosis not present

## 2013-08-03 DIAGNOSIS — D649 Anemia, unspecified: Secondary | ICD-10-CM | POA: Diagnosis not present

## 2013-08-03 DIAGNOSIS — N390 Urinary tract infection, site not specified: Secondary | ICD-10-CM | POA: Diagnosis not present

## 2013-08-03 DIAGNOSIS — R0989 Other specified symptoms and signs involving the circulatory and respiratory systems: Secondary | ICD-10-CM | POA: Diagnosis not present

## 2013-08-03 DIAGNOSIS — E876 Hypokalemia: Secondary | ICD-10-CM | POA: Diagnosis not present

## 2013-08-03 DIAGNOSIS — E1149 Type 2 diabetes mellitus with other diabetic neurological complication: Secondary | ICD-10-CM | POA: Diagnosis not present

## 2013-08-03 DIAGNOSIS — N171 Acute kidney failure with acute cortical necrosis: Secondary | ICD-10-CM | POA: Diagnosis not present

## 2013-08-03 DIAGNOSIS — E872 Acidosis, unspecified: Secondary | ICD-10-CM | POA: Diagnosis present

## 2013-08-03 DIAGNOSIS — N200 Calculus of kidney: Secondary | ICD-10-CM | POA: Diagnosis not present

## 2013-08-03 DIAGNOSIS — E860 Dehydration: Secondary | ICD-10-CM | POA: Diagnosis not present

## 2013-08-06 ENCOUNTER — Non-Acute Institutional Stay (SKILLED_NURSING_FACILITY): Payer: Medicare Other | Admitting: Internal Medicine

## 2013-08-06 DIAGNOSIS — E876 Hypokalemia: Secondary | ICD-10-CM | POA: Diagnosis not present

## 2013-08-06 DIAGNOSIS — R197 Diarrhea, unspecified: Secondary | ICD-10-CM | POA: Diagnosis not present

## 2013-08-06 NOTE — Progress Notes (Signed)
Patient ID: Maria Christian, female   DOB: 11/21/1924, 80 y.o.   MRN: [id number]  Location:  [facility]  Provider:  Rexene Edison. [name], D.O., C.M.D.    Chief Complaint  Patient presents with  . Acute Visit    hypokalemia    HPI:  3/2 given KCL 29meq po bid x 5 days, stat bmp, vs q4 hrs x 24 hrs with POX 08/05/13:  Give 60meq tonight, then 101meq daily in am, repeat bmp in am  Review of Systems:  Review of Systems  Constitutional: Negative for fever.  HENT: Negative for congestion.   Eyes: Positive for blurred vision.  Respiratory: Negative for shortness of breath.   Cardiovascular: Negative for chest pain.  Gastrointestinal: Positive for abdominal pain and diarrhea.  Genitourinary: Negative for dysuria.  Musculoskeletal: Negative for falls and myalgias.  Neurological: Positive for dizziness.  Psychiatric/Behavioral: Positive for memory loss.    Medications: Patient's Medications  New Prescriptions   No medications on file  Previous Medications   ACETAMINOPHEN (TYLENOL) 325 MG TABLET    Take 650 mg by mouth 2 (two) times daily. For OA.   ALPRAZOLAM (XANAX) 0.25 MG TABLET    Take one tablet by mouth once daily at 2pm; Take one tablet by mouth at bedtime as needed; Take one tablet by mouth every 12 hours as needed   AMLODIPINE (NORVASC) 5 MG TABLET    Take 5 mg by mouth daily.   ASPIRIN 81 MG CHEWABLE TABLET    Chew 81 mg by mouth daily.   CARVEDILOL (COREG) 3.125 MG TABLET    Take 3.125 mg by mouth daily.   CHOLECALCIFEROL (VITAMIN D) 1000 UNITS TABLET    Take 1,000 Units by mouth daily.   CLOPIDOGREL (PLAVIX) 75 MG TABLET    Take 75 mg by mouth daily.    CRANBERRY 475 MG CAPS    Take 1 capsule by mouth 2 (two) times daily.   DEXTROMETHORPHAN-GUAIFENESIN (MUCINEX DM) 30-600 MG PER 12 HR TABLET    Take 1 tablet by mouth every 12 (twelve) hours.   DICLOFENAC SODIUM (VOLTAREN) 1 % GEL    Apply 2 g topically 2 (two) times daily as needed (left neck pain).    DOCUSATE  SODIUM (COLACE) 100 MG CAPSULE    Take 1 capsule (100 mg total) by mouth 2 (two) times daily.   DONEPEZIL (ARICEPT) 5 MG TABLET    Take 5 mg by mouth at bedtime.   DORZOLAMIDE (TRUSOPT) 2 % OPHTHALMIC SOLUTION    Place 1 drop into both eyes 3 (three) times daily.    FEEDING SUPPLEMENT, ENSURE COMPLETE, (ENSURE COMPLETE) LIQD    Take 237 mLs by mouth 2 (two) times daily between meals.   FERROUS SULFATE 325 (65 FE) MG TABLET    Take 325 mg by mouth daily with breakfast.   FUROSEMIDE (LASIX) 40 MG TABLET    Take 40 mg by mouth daily.   GABAPENTIN (NEURONTIN) 300 MG CAPSULE    Take 300 mg by mouth 2 (two) times daily.   IBUPROFEN (ADVIL,MOTRIN) 400 MG TABLET    Take 400 mg by mouth daily at 12 noon.    KETOTIFEN (ZADITOR) 0.025 % OPHTHALMIC SOLUTION    Place 1 drop into both eyes 2 (two) times daily.   LACTULOSE (CHRONULAC) 10 GM/15ML SOLUTION    Take 30 g by mouth 2 (two) times daily.   LIDOCAINE (LIDODERM) 5 %    Place 1 patch onto the skin daily. Apply 1  patch to left lower back/hip every morning.  Remove & Discard patch within 12 hours or as directed by MD   LISINOPRIL (PRINIVIL,ZESTRIL) 20 MG TABLET    Take 1 tablet (20 mg total) by mouth daily.   LORATADINE (CLARITIN) 10 MG TABLET    Take 10 mg by mouth daily.   METFORMIN (GLUCOPHAGE) 1000 MG TABLET    Take 1,000 mg by mouth 2 (two) times daily with a meal.   MULTIPLE VITAMINS-MINERALS (CERTAGEN PO)    Take 1 tablet by mouth daily.    ONDANSETRON (ZOFRAN) 4 MG TABLET    Take 4 mg by mouth every 8 (eight) hours as needed for nausea or vomiting.   PANTOPRAZOLE (PROTONIX) 40 MG TABLET    Take 40 mg by mouth 2 (two) times daily.    POLYETHYL GLYCOL-PROPYL GLYCOL (SYSTANE OP)    Place 1 drop into both eyes 4 (four) times daily.   POLYETHYLENE GLYCOL (MIRALAX / GLYCOLAX) PACKET    Take 17 g by mouth daily.   POTASSIUM CHLORIDE SA (K-DUR,KLOR-CON) 20 MEQ TABLET    Take 20 mEq by mouth daily.    PRAVASTATIN (PRAVACHOL) 20 MG TABLET    Take 20 mg by  mouth at bedtime.    SERTRALINE (ZOLOFT) 50 MG TABLET    Take 50 mg by mouth at bedtime.   TRAVOPROST, BAK FREE, (TRAVATAN) 0.004 % SOLN OPHTHALMIC SOLUTION    Place 2 drops into both eyes at bedtime.    TRAZODONE (DESYREL) 50 MG TABLET    Take 100 mg by mouth at bedtime.    VITAMIN B-12 (CYANOCOBALAMIN) 500 MCG TABLET    Take 500 mcg by mouth daily.   VITAMIN C (ASCORBIC ACID) 500 MG TABLET    Take 500 mg by mouth daily.  Modified Medications   No medications on file  Discontinued Medications   No medications on file    Physical Exam:  Physical Exam  Cardiovascular: Normal rate, regular rhythm, normal heart sounds and intact distal pulses.   Pulmonary/Chest: Effort normal and breath sounds normal.  Abdominal: Soft. She exhibits no distension. There is no tenderness.  Hyperactive bowel sounds  Neurological: She is alert.  Skin: Skin is warm and dry.     Labs reviewed: Basic Metabolic Panel:  Recent Labs  05/28/13 0538  06/01/13 0505  06/02/13 0544  07/25/13 0239 07/26/13 0527 07/27/13 0400  NA 141  < > 141  --  142  < > 142 143 144  K 3.0*  < > 2.5*  < > 3.0*  < > 5.0 4.0 3.7  CL 112  < > 108  --  111  < > 114* 113* 114*  CO2 21  < > 23  --  22  < > 17* 17* 18*  GLUCOSE 118*  < > 144*  --  142*  < > 123* 164* 156*  BUN 6  < > 7  --  6  < > 30* 16 12  CREATININE 0.66  < > 0.64  --  0.66  < > 1.45* 0.87 0.81  CALCIUM 8.6  < > 9.0  --  8.9  < > 8.2* 8.9 9.0  MG 1.7  --  1.6  --  1.8  --   --   --   --   < > = values in this interval not displayed.  Liver Function Tests:  Recent Labs  07/24/13 0422 07/24/13 2028 07/26/13 0527  AST 14 45* 31  ALT 8  37* 29  ALKPHOS 75 61 73  BILITOT <0.2* <0.2* 0.3  PROT 6.8 6.0 6.7  ALBUMIN 3.6 3.0* 3.3*    CBC:  Recent Labs  05/26/13 0526 07/24/13 0422 07/24/13 2028 07/25/13 0239 07/26/13 0527 07/27/13 0400  WBC 6.8 7.6 5.0 5.1 6.2 6.0  NEUTROABS 4.5 6.0 3.1  --   --   --   HGB 9.5* 9.0* 8.9* 8.6* 9.0* 8.8*  HCT  28.3* 27.9* 27.0* 26.7* 27.9* 25.7*  MCV 100.7* 104.5* 104.2* 104.3* 102.6* 101.2*  PLT 121* 118* 111* 113* 115* 109*  08/04/13:  Wbc 7.3, h/h 9.4/29.6; glucose 119, Na 154, K 2.7, BUN 10, cr 0.91, transaminases nl 08/05/13:  Glucose 160, BUN 9, cr 0.82, Na 150, K 2.8 08/06/13:  Na 154, K 3.9, glucose 107, BUN 9, cr 0.8  Assessment/Plan 1. Hypokalemia -due to #3 -replete and recheck bmp  2. Diarrhea - need to bulk up stool to decrease this   Labs/tests ordered:  bmp

## 2013-08-20 ENCOUNTER — Non-Acute Institutional Stay (SKILLED_NURSING_FACILITY): Payer: Medicare Other | Admitting: Internal Medicine

## 2013-08-20 ENCOUNTER — Encounter: Payer: Self-pay | Admitting: Internal Medicine

## 2013-08-20 DIAGNOSIS — I509 Heart failure, unspecified: Secondary | ICD-10-CM

## 2013-08-20 DIAGNOSIS — M1710 Unilateral primary osteoarthritis, unspecified knee: Secondary | ICD-10-CM

## 2013-08-20 DIAGNOSIS — I4891 Unspecified atrial fibrillation: Secondary | ICD-10-CM | POA: Diagnosis not present

## 2013-08-20 DIAGNOSIS — I5032 Chronic diastolic (congestive) heart failure: Secondary | ICD-10-CM

## 2013-08-20 DIAGNOSIS — IMO0002 Reserved for concepts with insufficient information to code with codable children: Secondary | ICD-10-CM

## 2013-08-20 DIAGNOSIS — M170 Bilateral primary osteoarthritis of knee: Secondary | ICD-10-CM

## 2013-08-20 NOTE — Progress Notes (Signed)
Patient ID: Maria Christian, female   DOB: 1924/12/11, 80 y.o.   MRN: [id number]  Location:  [facility] Provider:  Rexene Edison. [name], D.O., C.M.D.  Code Status:  Full code  Chief Complaint  Patient presents with  . Acute Visit    c/o left leg and foot pain, xrays negative for fracture; palpitations in the early morning    HPI:  80 yo white female here for long term care was seen due to left leg and foot pain and palpitations early in the am.  She describes aching of both of her knees and her neck when I see her.  She has already had xrays of her left leg and foot which were negative.    Review of Systems:  Review of Systems  Constitutional: Negative for fever.  HENT: Negative for congestion.   Eyes: Positive for blurred vision.  Respiratory: Negative for shortness of breath.   Cardiovascular: Positive for palpitations. Negative for chest pain.  Gastrointestinal: Negative for diarrhea and constipation.  Genitourinary: Negative for dysuria.  Musculoskeletal: Positive for joint pain and neck pain.  Neurological: Negative for dizziness.  Psychiatric/Behavioral: Positive for memory loss.    Medications: Patient's Medications  New Prescriptions   FEEDING SUPPLEMENT, GLUCERNA SHAKE, (GLUCERNA SHAKE) LIQD    Take 237 mLs by mouth 2 (two) times daily between meals.   SACCHAROMYCES BOULARDII (FLORASTOR) 250 MG CAPSULE    Take 1 capsule (250 mg total) by mouth 2 (two) times daily.   SENNA (SENOKOT) 8.6 MG TABS TABLET    Take 1 tablet (8.6 mg total) by mouth 2 (two) times daily.  Previous Medications   ACETAMINOPHEN (TYLENOL) 500 MG TABLET    Take 1,000 mg by mouth 3 (three) times daily.   ASPIRIN 81 MG CHEWABLE TABLET    Chew 81 mg by mouth daily.   CARVEDILOL (COREG) 3.125 MG TABLET    Take 3.125 mg by mouth daily.   CHOLECALCIFEROL (VITAMIN D) 1000 UNITS TABLET    Take 1,000 Units by mouth daily.   CLOPIDOGREL (PLAVIX) 75 MG TABLET    Take 75 mg by mouth daily.    CRANBERRY 475 MG CAPS    Take 1 capsule by mouth 2 (two) times daily.   CYCLOBENZAPRINE (FLEXERIL) 5 MG TABLET    Take 5 mg by mouth at bedtime.   DICLOFENAC SODIUM (VOLTAREN) 1 % GEL    Apply 2 g topically 2 (two) times daily as needed (left neck pain).    DONEPEZIL (ARICEPT) 10 MG TABLET    Take 10 mg by mouth at bedtime.   DORZOLAMIDE (TRUSOPT) 2 % OPHTHALMIC SOLUTION    Place 1 drop into both eyes 3 (three) times daily.    FERROUS SULFATE 325 (65 FE) MG TABLET    Take 325 mg by mouth daily with breakfast.   GABAPENTIN (NEURONTIN) 300 MG CAPSULE    Take 300 mg by mouth 2 (two) times daily.   HYDRALAZINE (APRESOLINE) 10 MG TABLET    Take 10 mg by mouth 3 (three) times daily. HOLD if SBP <100/60 and pulse is less than 60   LIDOCAINE (LIDODERM) 5 %    Place 1 patch onto the skin daily. Apply 1 patch to left lower back/hip every morning.  Remove & Discard patch within 12 hours or as directed by MD   LORATADINE (CLARITIN) 10 MG TABLET    Take 10 mg by mouth daily.   MULTIPLE VITAMINS-MINERALS (CERTAGEN PO)    Take 1 tablet  by mouth daily.    POLYETHYL GLYCOL-PROPYL GLYCOL (SYSTANE) 0.4-0.3 % SOLN    Place 1 drop into both eyes 4 (four) times daily as needed (dry eyes).   PRAVASTATIN (PRAVACHOL) 20 MG TABLET    Take 20 mg by mouth at bedtime.    SERTRALINE (ZOLOFT) 50 MG TABLET    Take 50 mg by mouth daily.    SODIUM BICARBONATE 650 MG TABLET    Take 1,300 mg by mouth 3 (three) times daily.   TRAVOPROST, BAK FREE, (TRAVATAN) 0.004 % SOLN OPHTHALMIC SOLUTION    Place 2 drops into both eyes at bedtime.    TRAZODONE (DESYREL) 50 MG TABLET    Take 100 mg by mouth at bedtime.    VITAMIN B-12 (CYANOCOBALAMIN) 500 MCG TABLET    Take 500 mcg by mouth daily.   VITAMIN C (ASCORBIC ACID) 500 MG TABLET    Take 500 mg by mouth daily.  Modified Medications   Modified Medication Previous Medication   ALPRAZOLAM (XANAX) 0.25 MG TABLET ALPRAZolam (XANAX) 0.25 MG tablet      Take 1 tablet (0.25 mg total) by mouth  daily as needed for anxiety.    Take 1 tablet (0.25 mg total) by mouth daily as needed for anxiety.   AMLODIPINE (NORVASC) 2.5 MG TABLET amLODipine (NORVASC) 5 MG tablet      Take 1 tablet (2.5 mg total) by mouth daily.    Take 5 mg by mouth daily.   FUROSEMIDE (LASIX) 40 MG TABLET furosemide (LASIX) 40 MG tablet      Take 1 tablet (40 mg total) by mouth 2 (two) times daily.    Take 40 mg by mouth daily.   LORAZEPAM (ATIVAN) 0.5 MG TABLET LORazepam (ATIVAN) 0.5 MG tablet      Take 1 tablet (0.5 mg total) by mouth every 8 (eight) hours as needed for anxiety.    Take 0.5 mg by mouth 2 (two) times daily.   POTASSIUM CHLORIDE SA (K-DUR,KLOR-CON) 20 MEQ TABLET potassium chloride SA (K-DUR,KLOR-CON) 20 MEQ tablet      Take 20 mEq by mouth daily.    Take 3 tablets (60 mEq total) by mouth 3 (three) times daily.  Discontinued Medications   ACETAMINOPHEN (TYLENOL) 325 MG TABLET    Take 650 mg by mouth 2 (two) times daily. For OA.   ALPRAZOLAM (XANAX) 0.25 MG TABLET    Take one tablet by mouth once daily at 2pm; Take one tablet by mouth at bedtime as needed; Take one tablet by mouth every 12 hours as needed   DEXTROMETHORPHAN-GUAIFENESIN (MUCINEX DM) 30-600 MG PER 12 HR TABLET    Take 1 tablet by mouth 2 (two) times daily as needed for cough.    DOCUSATE SODIUM (COLACE) 100 MG CAPSULE    Take 1 capsule (100 mg total) by mouth 2 (two) times daily.   DONEPEZIL (ARICEPT) 5 MG TABLET    Take 5 mg by mouth at bedtime.   FEEDING SUPPLEMENT, ENSURE COMPLETE, (ENSURE COMPLETE) LIQD    Take 237 mLs by mouth 2 (two) times daily between meals.   FUROSEMIDE (LASIX) 40 MG TABLET    Take 40 mg by mouth daily.   IBUPROFEN (ADVIL,MOTRIN) 400 MG TABLET    Take 400 mg by mouth every 6 (six) hours as needed for moderate pain.    KETOTIFEN (ZADITOR) 0.025 % OPHTHALMIC SOLUTION    Place 1 drop into both eyes 2 (two) times daily.   LACTULOSE (CHRONULAC) 10 GM/15ML SOLUTION  Take 30 g by mouth 2 (two) times daily.   LISINOPRIL  (PRINIVIL,ZESTRIL) 20 MG TABLET    Take 1 tablet (20 mg total) by mouth daily.   METFORMIN (GLUCOPHAGE) 1000 MG TABLET    Take 1,000 mg by mouth 2 (two) times daily with a meal.   ONDANSETRON (ZOFRAN) 4 MG TABLET    Take 4 mg by mouth every 8 (eight) hours as needed for nausea or vomiting.   PANTOPRAZOLE (PROTONIX) 40 MG TABLET    Take 40 mg by mouth 2 (two) times daily.    POLYETHYL GLYCOL-PROPYL GLYCOL (SYSTANE OP)    Place 1 drop into both eyes 4 (four) times daily.   POLYETHYLENE GLYCOL (MIRALAX / GLYCOLAX) PACKET    Take 17 g by mouth daily.   POTASSIUM CHLORIDE SA (K-DUR,KLOR-CON) 20 MEQ TABLET    Take 40 mEq by mouth daily.     Physical Exam: Filed Vitals:   08/20/13 1413  BP: 121/60  Pulse: 71  Temp: 98.2 F (36.8 C)  Resp: 18   Physical Exam  Constitutional: No distress.  Cardiovascular: Normal rate, regular rhythm, normal heart sounds and intact distal pulses.   Pulmonary/Chest: Effort normal and breath sounds normal. No respiratory distress.  Abdominal: Soft. Bowel sounds are normal. She exhibits no distension and no mass. There is no tenderness.  Musculoskeletal: She exhibits tenderness.  Of bilateral knees, no effusions, no swelling of left foot or ankle, no ecchymoses  Neurological: She is alert.  Skin: Skin is warm and dry.  Psychiatric: She has a normal mood and affect.     Labs reviewed: Basic Metabolic Panel:  Recent Labs  10/22/13 0534  10/23/13 0438  01/03/14 0421 01/04/14 0519 01/05/14 0432  NA 144  --  141  < > 142 142 139  K 3.1*  < > 3.3*  < > 2.9* 4.3 4.2  CL 116*  --  114*  < > 100 103 101  CO2 16*  --  16*  < > 33* 28 29  GLUCOSE 129*  --  133*  < > 137* 116* 136*  BUN <3*  --  <3*  < > 18 18 23   CREATININE 1.00  --  0.93  < > 1.14* 1.18* 1.40*  CALCIUM 8.4  --  8.4  < > 9.1 9.1 9.1  MG 1.6  --  2.3  --  1.9  --   --   < > = values in this interval not displayed.  Liver Function Tests:  Recent Labs  10/09/13 1906 12/27/13 2145  01/04/14 0519  AST 20 15 53*  ALT 10 7 16   ALKPHOS 72 82 69  BILITOT 0.2* 0.3 0.3  PROT 6.1 7.6 6.5  ALBUMIN 3.1* 3.7 3.2*    CBC:  Recent Labs  07/24/13 2028  10/09/13 1906  10/17/13 1000  12/31/13 0445 01/02/14 0415 01/04/14 0519  WBC 5.0  < > 5.8  < > 5.6  < > 4.1 3.8* 4.2  NEUTROABS 3.1  --  4.2  --  3.8  --   --   --   --   HGB 8.9*  < > 7.8*  < > 8.2*  < > 8.2* 8.4* 8.8*  HCT 27.0*  < > 23.3*  < > 23.8*  < > 26.5* 26.3* 27.3*  MCV 104.2*  < > 99.6  < > 98.3  < > 108.6* 106.9* 106.6*  PLT 111*  < > 133*  < > 177  < > 130*  129* 130*  < > = values in this interval not displayed.08/06/13:  Glucose 107, BUN 9, cr 0.8, Na 154, K 3.9 08/08/13:  Glucose 152, BUN 11, cr 0.85, Na 151, K 4.1 08/17/13:  Left ankle, foot, tibia/fibula xrays:  No acute osseous pathology ankle, no radiographic evidence of acute osseous pathology left tib/fib, no radiographic evidence of acute osseous pathology left foot 08/20/13:  Glucose 86, Na 142, K 3.4, BUN 13, cr 0.86  Assessment/Plan 1. Osteoarthritis of both knees, unspecified osteoarthritis type -add voltaren gel for knees as well as neck pain -increase tylenol to es 500mg  2 po tid   2. Atrial fibrillation with controlled ventricular response -cont asa, coreg  3. Diastolic CHF, chronic -stable with current therapy  Family/ staff Communication: discussed with nurse Goals of care: long term care resident, code status full code on admission paperwork here

## 2013-09-11 ENCOUNTER — Ambulatory Visit (INDEPENDENT_AMBULATORY_CARE_PROVIDER_SITE_OTHER): Payer: Medicare Other | Admitting: Nurse Practitioner

## 2013-09-11 ENCOUNTER — Encounter: Payer: Self-pay | Admitting: Nurse Practitioner

## 2013-09-11 VITALS — BP 113/67 | HR 78 | Temp 98.4°F | Ht 65.0 in

## 2013-09-11 DIAGNOSIS — R51 Headache: Secondary | ICD-10-CM | POA: Diagnosis not present

## 2013-09-11 DIAGNOSIS — I2510 Atherosclerotic heart disease of native coronary artery without angina pectoris: Secondary | ICD-10-CM | POA: Diagnosis not present

## 2013-09-11 NOTE — Progress Notes (Signed)
PATIENT: Maria Christian DOB: Apr 29, 1925  REASON FOR VISIT: follow up for headaches HISTORY FROM: patient  HISTORY OF PRESENT ILLNESS: 80 year lady with recurrent headaches likely mixed vascular and tension headaches now worsened likely due to discontinuation of topamax. Mild Cognitive impairment/mild dementia which is stable. Silent cerebrovascular disease on MRI.  She returns for f/u today after last visit on 08/13/12. She actually did not have an appointment today but SNF where she stays had a mix up and sent her today hence I agreed to see her. She reports increase in headaches since stopping Topamax after last visit. Headaches are now allmost daily, they are mild to moderate mostly in left temples and occasionally right side as well. She also complains of leg pain and restlessness at night and has been started on gabapentin 100 mg at night with partial benefit.She states her appetite is better and she has been eating more since discontinuing Topamax.   UPDATE 03/11/13 (LL): Maria Christian comes in for 3 month follow up for headaches. She is doing well, complains of sharp pain occasionally in left temporal region of head. Left hip pain is what really seems to be bothering her today. She had an xray recently which was negative. At last visit Dr. Leonie Man started her on Depakote for headache prevention, but son who is HCPOA and lives in [place] found out she was on it and demanded that she be taken off of it due to side effect profile. Daughter who accompanies her in office today says that it really was helping her, but she states "you cannot tell my brother anything." Mild dementia appears stable, and Maria Christian is pleasantly confused. She takes plavix daily and appears to be tolerating it well. She states she sleeps very well.   UPDATE 09/11/13 (LL): Maria Christian returns with her daughter for follow up of headaches. She is doing well after having 2 hospitalizations since last visit-- one for sigmoid volvulus  and another for dehydration, ARF in setting of UTI.  Physical Therapy has been working with her and she is stronger than when last seen here in the office.  She is now living at [facility] on [address] and daughter is very pleased with her care there.  Daughter states her dementia is progressing and she is reverting to some child-like behaviors.  Her headaches seem to be stable, she describes left temporal pain again, and pain around her eyes.  She is currently getting Gabapentin twice daily for headache prevention and takes Tylenol TID for osteoarthritis pain.    ROS:  14 system review of systems is positive for hearing loss, memory loss  ALLERGIES: Allergies  Allergen Reactions  . Aspirin Other (See Comments)    [name]    HOME MEDICATIONS: Outpatient Prescriptions Prior to Visit  Medication Sig Dispense Refill  . acetaminophen (TYLENOL) 325 MG tablet Take 650 mg by mouth 2 (two) times daily. For OA.      Marland Kitchen ALPRAZolam (XANAX) 0.25 MG tablet Take one tablet by mouth once daily at 2pm; Take one tablet by mouth at bedtime as needed; Take one tablet by mouth every 12 hours as needed  120 tablet  5  . amLODipine (NORVASC) 5 MG tablet Take 5 mg by mouth daily.      Marland Kitchen aspirin 81 MG chewable tablet Chew 81 mg by mouth daily.      . carvedilol (COREG) 3.125 MG tablet Take 3.125 mg by mouth daily.      Marland Kitchen  cholecalciferol (VITAMIN D) 1000 UNITS tablet Take 1,000 Units by mouth daily.      . clopidogrel (PLAVIX) 75 MG tablet Take 75 mg by mouth daily.       . Cranberry 475 MG CAPS Take 1 capsule by mouth 2 (two) times daily.      Marland Kitchen dextromethorphan-guaiFENesin (MUCINEX DM) 30-600 MG per 12 hr tablet Take 1 tablet by mouth every 12 (twelve) hours.      . diclofenac sodium (VOLTAREN) 1 % GEL Apply 2 g topically 2 (two) times daily as needed (left neck pain).       Marland Kitchen docusate sodium (COLACE) 100 MG capsule Take 1 capsule (100 mg total) by mouth 2 (two) times daily.  10 capsule  0  . donepezil  (ARICEPT) 5 MG tablet Take 5 mg by mouth at bedtime.      . dorzolamide (TRUSOPT) 2 % ophthalmic solution Place 1 drop into both eyes 3 (three) times daily.       . feeding supplement, ENSURE COMPLETE, (ENSURE COMPLETE) LIQD Take 237 mLs by mouth 2 (two) times daily between meals.      . ferrous sulfate 325 (65 FE) MG tablet Take 325 mg by mouth daily with breakfast.      . furosemide (LASIX) 40 MG tablet Take 40 mg by mouth daily.      Marland Kitchen gabapentin (NEURONTIN) 300 MG capsule Take 300 mg by mouth 2 (two) times daily.      Marland Kitchen ibuprofen (ADVIL,MOTRIN) 400 MG tablet Take 400 mg by mouth daily at 12 noon.       Marland Kitchen ketotifen (ZADITOR) 0.025 % ophthalmic solution Place 1 drop into both eyes 2 (two) times daily.      Marland Kitchen lactulose (CHRONULAC) 10 GM/15ML solution Take 30 g by mouth 2 (two) times daily.      Marland Kitchen lidocaine (LIDODERM) 5 % Place 1 patch onto the skin daily. Apply 1 patch to left lower back/hip every morning.  Remove & Discard patch within 12 hours or as directed by MD  5 patch  0  . lisinopril (PRINIVIL,ZESTRIL) 20 MG tablet Take 1 tablet (20 mg total) by mouth daily.      Marland Kitchen loratadine (CLARITIN) 10 MG tablet Take 10 mg by mouth daily.      . metFORMIN (GLUCOPHAGE) 1000 MG tablet Take 1,000 mg by mouth 2 (two) times daily with a meal.      . Multiple Vitamins-Minerals (CERTAGEN PO) Take 1 tablet by mouth daily.       . ondansetron (ZOFRAN) 4 MG tablet Take 4 mg by mouth every 8 (eight) hours as needed for nausea or vomiting.      . pantoprazole (PROTONIX) 40 MG tablet Take 40 mg by mouth 2 (two) times daily.       Vladimir Faster Glycol-Propyl Glycol (SYSTANE OP) Place 1 drop into both eyes 4 (four) times daily.      . polyethylene glycol (MIRALAX / GLYCOLAX) packet Take 17 g by mouth daily.      . potassium chloride SA (K-DUR,KLOR-CON) 20 MEQ tablet Take 20 mEq by mouth daily.       . pravastatin (PRAVACHOL) 20 MG tablet Take 20 mg by mouth at bedtime.       . sertraline (ZOLOFT) 50 MG tablet Take 50  mg by mouth at bedtime.      . Travoprost, BAK Free, (TRAVATAN) 0.004 % SOLN ophthalmic solution Place 2 drops into both eyes at bedtime.       Marland Kitchen  traZODone (DESYREL) 50 MG tablet Take 100 mg by mouth at bedtime.       . vitamin B-12 (CYANOCOBALAMIN) 500 MCG tablet Take 500 mcg by mouth daily.      . vitamin C (ASCORBIC ACID) 500 MG tablet Take 500 mg by mouth daily.       No facility-administered medications prior to visit.     PHYSICAL EXAM  Filed Vitals:   09/11/13 1514  BP: 113/67  Pulse: 78  Temp: 98.4 F (36.9 C)  TempSrc: Oral  Height: 5\' 5"  (1.651 m)   Body mass index is 0.00 kg/(m^2).  Physical Exam  General: Pleasant  elderly lady, in no distress.  Afebrile.   Head: nontraumatic Ears, Nose and Throat: Hearing is  diminished bilaterally Neck: supple without bruit Respiratory: clear to auscultation Cardiovascular: Irregular rhythm, no murmur or gallop Musculoskeletal:  mild kyphosis Skin:  trace pedal edema.  Neurologic Exam  Mental Status: Awake, alert and oriented to time, place and person.  Speech and language appear normal.   Mini-Mental status exam score 23/30 with deficits in orientation, recall and repetition. Animal fluency test score 8 only clock drawing scored 3/4.   Cranial Nerves: Eye movements are full range without nystagmus.  Visual fields are full to confrontational testing.  Face is symmetric without weakness.  Tongue is midline. Hearing is  diminished bilaterally Motor: reveals no upper or lower extremity drift.  Symmetric and equal strength in all four extremities.  No focal weakness.  no resting or action tremor. No cogwheel rigidity Sensory: Touch and pinprick sensations are normal.   Coordination: normal Gait and Station:  Unable to get up from the wheelchair without assistance. Needs 2 people assist hence deferred Reflexes: Deep tendon reflexes are 2+ symmetric.  ASSESSMENT AND PLAN 80 y.o. year old female  has a past medical history of Atrial  fibrillation;  Hypertension; GERD (gastroesophageal reflux disease); Hyperlipemia; Diabetes mellitus; Coronary artery disease; History of adenomatous polyp of colon (06/24/99); Enlarged heart; DEMENTIA; Edema of lower extremity (07/13/11); Esophageal dysmotility (07/02/12); Horseshoe kidney; Pancreatic lesion (05/22/11); Anemia; Peripheral neuropathy; Depression; and Thrombophlebitis here with for follow up of headaches.  Her headaches seem stable on twice daily Gabapentin.  She is on so many medications at this time I do not feel adding another will prove any benefit.  She was started on Aricept in the past, possibly before moving to [place].  Her dementia appears to be of Alzheimer's type, due to slow progressive course.  Continue current treatment for headache, Gabapentin twice daily and Tylenol for pain. Follow up in our office as needed.  Philmore Pali, MSN, NP-C 09/11/2013, 4:20 PM [facility] [address] [place], [place] [address] [phone]  Note: This document was prepared with digital dictation and possible smart phrase technology. Any transcriptional errors that result from this process are unintentional.

## 2013-09-11 NOTE — Patient Instructions (Signed)
Continue current treatment for headache, Gabapentin twice daily and Tylenol for pain.  Follow up in our office as needed.

## 2013-09-25 DIAGNOSIS — E1142 Type 2 diabetes mellitus with diabetic polyneuropathy: Secondary | ICD-10-CM | POA: Diagnosis not present

## 2013-09-25 DIAGNOSIS — E1149 Type 2 diabetes mellitus with other diabetic neurological complication: Secondary | ICD-10-CM | POA: Diagnosis not present

## 2013-09-25 DIAGNOSIS — B351 Tinea unguium: Secondary | ICD-10-CM | POA: Diagnosis not present

## 2013-09-25 DIAGNOSIS — M79609 Pain in unspecified limb: Secondary | ICD-10-CM | POA: Diagnosis not present

## 2013-09-30 ENCOUNTER — Encounter: Payer: Self-pay | Admitting: Internal Medicine

## 2013-09-30 ENCOUNTER — Non-Acute Institutional Stay (SKILLED_NURSING_FACILITY): Payer: Medicare Other | Admitting: Internal Medicine

## 2013-09-30 DIAGNOSIS — M62838 Other muscle spasm: Secondary | ICD-10-CM

## 2013-09-30 NOTE — Progress Notes (Signed)
MRN: [id number] Name: Maria Christian  Sex: female Age: 80 y.o. DOB: Dec 27, 1924  Mena #: [name] Facility/Room: 220A Level Of Care: SNF Provider: Hennie Duos Emergency Contacts: Extended Emergency Contact Information Primary Emergency Contact: [name] [place] Phone: [phone] Relation: Son Secondary Emergency Contact: Roberts,Lynn Address: [address]          [place], [place] [address] [name] of [place] Phone: [phone] Mobile Phone: [phone] Relation: Daughter  Code Status: FULL  Allergies: Aspirin  Chief Complaint  Patient presents with  . Acute Visit    HPI: Patient is 80 y.o. female who PT has asked me to see. She has c/o neck pain and they have noted spasms in neck muscles which are relieved sometimes with massage or heat, but not this time.Spasms are recurrent. She is miserable,  Past Medical History  Diagnosis Date  . Atrial fibrillation   . Altered mental status   . Encephalopathy   . Parkinson disease   . Hypertension   . GERD (gastroesophageal reflux disease)   . Hyperlipemia   . Diabetes mellitus   . Coronary artery disease   . History of adenomatous polyp of colon 06/24/99  . Enlarged heart   . DEMENTIA   . Edema of lower extremity 07/13/11    right leg more swollen than left leg  . Esophageal dysmotility 07/02/12  . Horseshoe kidney   . Pancreatic lesion 05/22/11    no further workup  per PCP/family due to age  . Anemia   . Peripheral neuropathy   . Depression   . Thrombophlebitis     Past Surgical History  Procedure Laterality Date  . Shoulder surgery  2001    left clavicle excision and acromioplasty  . Abdominal hysterectomy    . Cholecystectomy    . Total hip arthroplasty    . Breast surgery      2 benign tumors removed left breast  . Foot surgery      benign tumors from foot  . Esophageal dilation      several times by Dr. [name]  . Nose surgery    .  Esophagogastroduodenoscopy (egd) with esophageal dilation N/A 08/01/2012    Procedure: ESOPHAGOGASTRODUODENOSCOPY (EGD) WITH ESOPHAGEAL DILATION;  Surgeon: [name], MD;  Location: [facility];  Service: Endoscopy;  Laterality: N/A;  with c-arm savory dilators  . Flexible sigmoidoscopy N/A 05/24/2013    Procedure: FLEXIBLE SIGMOIDOSCOPY;  Surgeon: [name], MD;  Location: [facility];  Service: Endoscopy;  Laterality: N/A;  . Flexible sigmoidoscopy N/A 05/26/2013    Procedure:  flex with decompression of sigmoid volvulus;  Surgeon: [name], MD;  Location: [facility];  Service: Endoscopy;  Laterality: N/A;      Medication List       This list is accurate as of: 09/30/13 10:17 PM.  Always use your most recent med list.               acetaminophen 325 MG tablet  Commonly known as:  TYLENOL  Take 650 mg by mouth 2 (two) times daily. For OA.     ALPRAZolam 0.25 MG tablet  Commonly known as:  XANAX  Take one tablet by mouth once daily at 2pm; Take one tablet by mouth at bedtime as needed; Take one tablet by mouth every 12 hours as needed     amLODipine 5 MG tablet  Commonly known as:  NORVASC  Take 5 mg by mouth daily.  aspirin 81 MG chewable tablet  Chew 81 mg by mouth daily.     carvedilol 3.125 MG tablet  Commonly known as:  COREG  Take 3.125 mg by mouth daily.     CERTAGEN PO  Take 1 tablet by mouth daily.     cholecalciferol 1000 UNITS tablet  Commonly known as:  VITAMIN D  Take 1,000 Units by mouth daily.     clopidogrel 75 MG tablet  Commonly known as:  PLAVIX  Take 75 mg by mouth daily.     Cranberry 475 MG Caps  Take 1 capsule by mouth 2 (two) times daily.     dextromethorphan-guaiFENesin 30-600 MG per 12 hr tablet  Commonly known as:  MUCINEX DM  Take 1 tablet by mouth every 12 (twelve) hours.     docusate sodium 100 MG capsule  Commonly known as:  COLACE  Take 1 capsule (100 mg total) by mouth 2 (two) times daily.     donepezil 5  MG tablet  Commonly known as:  ARICEPT  Take 5 mg by mouth at bedtime.     dorzolamide 2 % ophthalmic solution  Commonly known as:  TRUSOPT  Place 1 drop into both eyes 3 (three) times daily.     feeding supplement (ENSURE COMPLETE) Liqd  Take 237 mLs by mouth 2 (two) times daily between meals.     ferrous sulfate 325 (65 FE) MG tablet  Take 325 mg by mouth daily with breakfast.     furosemide 40 MG tablet  Commonly known as:  LASIX  Take 40 mg by mouth daily.     gabapentin 300 MG capsule  Commonly known as:  NEURONTIN  Take 300 mg by mouth 2 (two) times daily.     ibuprofen 400 MG tablet  Commonly known as:  ADVIL,MOTRIN  Take 400 mg by mouth daily at 12 noon.     ketotifen 0.025 % ophthalmic solution  Commonly known as:  ZADITOR  Place 1 drop into both eyes 2 (two) times daily.     lactulose 10 GM/15ML solution  Commonly known as:  CHRONULAC  Take 30 g by mouth 2 (two) times daily.     lidocaine 5 %  Commonly known as:  LIDODERM  Place 1 patch onto the skin daily. Apply 1 patch to left lower back/hip every morning.  Remove & Discard patch within 12 hours or as directed by MD     lisinopril 20 MG tablet  Commonly known as:  PRINIVIL,ZESTRIL  Take 1 tablet (20 mg total) by mouth daily.     loratadine 10 MG tablet  Commonly known as:  CLARITIN  Take 10 mg by mouth daily.     metFORMIN 1000 MG tablet  Commonly known as:  GLUCOPHAGE  Take 1,000 mg by mouth 2 (two) times daily with a meal.     ondansetron 4 MG tablet  Commonly known as:  ZOFRAN  Take 4 mg by mouth every 8 (eight) hours as needed for nausea or vomiting.     pantoprazole 40 MG tablet  Commonly known as:  PROTONIX  Take 40 mg by mouth 2 (two) times daily.     polyethylene glycol packet  Commonly known as:  MIRALAX / GLYCOLAX  Take 17 g by mouth daily.     potassium chloride SA 20 MEQ tablet  Commonly known as:  K-DUR,KLOR-CON  Take 20 mEq by mouth daily.     pravastatin 20 MG tablet   Commonly known as:  PRAVACHOL  Take 20 mg by mouth at bedtime.     sertraline 50 MG tablet  Commonly known as:  ZOLOFT  Take 50 mg by mouth at bedtime.     SYSTANE OP  Place 1 drop into both eyes 4 (four) times daily.     Travoprost (BAK Free) 0.004 % Soln ophthalmic solution  Commonly known as:  TRAVATAN  Place 2 drops into both eyes at bedtime.     traZODone 50 MG tablet  Commonly known as:  DESYREL  Take 100 mg by mouth at bedtime.     vitamin B-12 500 MCG tablet  Commonly known as:  CYANOCOBALAMIN  Take 500 mcg by mouth daily.     vitamin C 500 MG tablet  Commonly known as:  ASCORBIC ACID  Take 500 mg by mouth daily.     VOLTAREN 1 % Gel  Generic drug:  diclofenac sodium  Apply 2 g topically 2 (two) times daily as needed (left neck pain).        No orders of the defined types were placed in this encounter.     There is no immunization history on file for this patient.  History  Substance Use Topics  . Smoking status: Former Research scientist (life sciences)  . Smokeless tobacco: Never Used  . Alcohol Use: No    Review of Systems  DATA OBTAINED: from patient, PT GENERAL:  no fevers, fatigue, appetite changes SKIN: No itching, rash HEENT: No complaint RESPIRATORY: No cough, wheezing, SOB CARDIAC: No chest pain, palpitations, lower extremity edema  GI: No abdominal pain, No N/V/D or constipation, No heartburn or reflux  GU: No dysuria, frequency or urgency, or incontinence  MUSCULOSKELETAL: neck hurts so bad, she can't stand it NEUROLOGIC: + headache, no dizziness or focal weakness PSYCHIATRIC: + anxiety    Filed Vitals:   09/30/13 2215  BP: 103/51  Pulse: 56  Temp: 97.3 F (36.3 C)  Resp: 17    Physical Exam  GENERAL APPEARANCE: Alert, conversant. Appropriately groomed. Appears very uncomfortable  SKIN: No diaphoresis rash, or wounds HEENT: Unremarkable RESPIRATORY: Breathing is even, unlabored. Lung sounds are clear   CARDIOVASCULAR: Heart RRR no murmurs, rubs or  gallops. No peripheral edema  GASTROINTESTINAL: Abdomen is soft, non-tender, not distended w/ normal bowel sounds.  GENITOURINARY: Bladder non tender, not distended  MUSCULOSKELETAL: neck muscles are very tight R>L and TTP NEUROLOGIC: Cranial nerves 2-12 grossly intact. Moves all extremities no tremor. PSYCHIATRIC: + anxiety, no behavioral issues  Patient Active Problem List   Diagnosis Date Noted  . FTT (failure to thrive) in adult 07/29/2013  . Dehydration 07/25/2013  . Palliative care encounter 07/25/2013  . Weakness generalized 07/25/2013  . Hypotension 07/24/2013  . Volvulus of sigmoid colon 05/26/2013  . Pain in right shoulder joint 05/25/2013  . Sigmoid volvulus 05/24/2013  . Pulmonary hypertension 05/24/2013  . Sacral decubitus ulcer 05/24/2013  . HOH (hard of hearing) 05/24/2013  . TIA (transient ischemic attack) 12/19/2012  . Dementia 12/19/2012  . Insomnia 12/19/2012  . Depression 12/19/2012  . Constipation 12/19/2012  . Pancreatic mass 07/17/2011  . Dysphagia 07/17/2011  . Thrombocytopenia 07/16/2011  . E. coli UTI 07/13/2011  . History of adenomatous polyp of colon 07/02/2011  . Preventative health care 07/01/2011  . Hypertension   . GERD (gastroesophageal reflux disease)   . Hyperlipemia   . Diabetes mellitus   . Coronary artery disease   . Atrial fibrillation with controlled ventricular response 05/24/2011  . Diastolic CHF, chronic 123456  CBC    Component Value Date/Time   WBC 6.0 07/27/2013 0400   RBC 2.54* 07/27/2013 0400   HGB 8.8* 07/27/2013 0400   HCT 25.7* 07/27/2013 0400   PLT 109* 07/27/2013 0400   MCV 101.2* 07/27/2013 0400   LYMPHSABS 1.2 07/24/2013 2028   MONOABS 0.5 07/24/2013 2028   EOSABS 0.2 07/24/2013 2028   BASOSABS 0.0 07/24/2013 2028    CMP     Component Value Date/Time   NA 144 07/27/2013 0400   K 3.7 07/27/2013 0400   CL 114* 07/27/2013 0400   CO2 18* 07/27/2013 0400   GLUCOSE 156* 07/27/2013 0400   BUN 12 07/27/2013 0400    CREATININE 0.81 07/27/2013 0400   CALCIUM 9.0 07/27/2013 0400   PROT 6.7 07/26/2013 0527   ALBUMIN 3.3* 07/26/2013 0527   AST 31 07/26/2013 0527   ALT 29 07/26/2013 0527   ALKPHOS 73 07/26/2013 0527   BILITOT 0.3 07/26/2013 0527   GFRNONAA 64* 07/27/2013 0400   GFRAA 74* 07/27/2013 0400    Assessment and Plan  MUSCLE SPASMS IN NECK- major neck spasms;as much as I would worry about any med that may make her fall those spasms are not going away with more conservative tx. Flexeril 10 mg qHS for 5 nights then back off to 5 mg nightly to keep them from recurring. If she is too sleepy nurses will let me know and we can back off sooner than 5 days. I have written for her to be up only with assist for next week.  Hennie Duos, MD

## 2013-10-03 ENCOUNTER — Non-Acute Institutional Stay (SKILLED_NURSING_FACILITY): Payer: Medicare Other | Admitting: Internal Medicine

## 2013-10-03 DIAGNOSIS — Z5189 Encounter for other specified aftercare: Secondary | ICD-10-CM | POA: Diagnosis not present

## 2013-10-03 DIAGNOSIS — D539 Nutritional anemia, unspecified: Secondary | ICD-10-CM | POA: Diagnosis present

## 2013-10-03 DIAGNOSIS — N189 Chronic kidney disease, unspecified: Secondary | ICD-10-CM | POA: Diagnosis present

## 2013-10-03 DIAGNOSIS — Z87891 Personal history of nicotine dependence: Secondary | ICD-10-CM | POA: Diagnosis not present

## 2013-10-03 DIAGNOSIS — G20 Parkinson's disease: Secondary | ICD-10-CM | POA: Diagnosis not present

## 2013-10-03 DIAGNOSIS — I9589 Other hypotension: Secondary | ICD-10-CM | POA: Diagnosis not present

## 2013-10-03 DIAGNOSIS — E872 Acidosis: Secondary | ICD-10-CM | POA: Diagnosis present

## 2013-10-03 DIAGNOSIS — E440 Moderate protein-calorie malnutrition: Secondary | ICD-10-CM | POA: Diagnosis present

## 2013-10-03 DIAGNOSIS — I4891 Unspecified atrial fibrillation: Secondary | ICD-10-CM | POA: Diagnosis not present

## 2013-10-03 DIAGNOSIS — E860 Dehydration: Secondary | ICD-10-CM | POA: Diagnosis not present

## 2013-10-03 DIAGNOSIS — I129 Hypertensive chronic kidney disease with stage 1 through stage 4 chronic kidney disease, or unspecified chronic kidney disease: Secondary | ICD-10-CM | POA: Diagnosis present

## 2013-10-03 DIAGNOSIS — N171 Acute kidney failure with acute cortical necrosis: Secondary | ICD-10-CM | POA: Diagnosis not present

## 2013-10-03 DIAGNOSIS — I10 Essential (primary) hypertension: Secondary | ICD-10-CM | POA: Diagnosis not present

## 2013-10-03 DIAGNOSIS — I2510 Atherosclerotic heart disease of native coronary artery without angina pectoris: Secondary | ICD-10-CM | POA: Diagnosis present

## 2013-10-03 DIAGNOSIS — O10019 Pre-existing essential hypertension complicating pregnancy, unspecified trimester: Secondary | ICD-10-CM | POA: Diagnosis not present

## 2013-10-03 DIAGNOSIS — D6489 Other specified anemias: Secondary | ICD-10-CM | POA: Diagnosis not present

## 2013-10-03 DIAGNOSIS — K5989 Other specified functional intestinal disorders: Secondary | ICD-10-CM | POA: Diagnosis present

## 2013-10-03 DIAGNOSIS — D696 Thrombocytopenia, unspecified: Secondary | ICD-10-CM | POA: Diagnosis not present

## 2013-10-03 DIAGNOSIS — Z1331 Encounter for screening for depression: Secondary | ICD-10-CM | POA: Diagnosis not present

## 2013-10-03 DIAGNOSIS — R0989 Other specified symptoms and signs involving the circulatory and respiratory systems: Secondary | ICD-10-CM | POA: Diagnosis not present

## 2013-10-03 DIAGNOSIS — A0472 Enterocolitis due to Clostridium difficile, not specified as recurrent: Secondary | ICD-10-CM | POA: Diagnosis not present

## 2013-10-03 DIAGNOSIS — N179 Acute kidney failure, unspecified: Secondary | ICD-10-CM

## 2013-10-03 DIAGNOSIS — R4182 Altered mental status, unspecified: Secondary | ICD-10-CM | POA: Diagnosis present

## 2013-10-03 DIAGNOSIS — R3989 Other symptoms and signs involving the genitourinary system: Secondary | ICD-10-CM | POA: Diagnosis present

## 2013-10-03 DIAGNOSIS — Z515 Encounter for palliative care: Secondary | ICD-10-CM | POA: Diagnosis not present

## 2013-10-03 DIAGNOSIS — K56609 Unspecified intestinal obstruction, unspecified as to partial versus complete obstruction: Secondary | ICD-10-CM | POA: Diagnosis present

## 2013-10-03 DIAGNOSIS — R109 Unspecified abdominal pain: Secondary | ICD-10-CM | POA: Diagnosis not present

## 2013-10-03 DIAGNOSIS — Z96649 Presence of unspecified artificial hip joint: Secondary | ICD-10-CM | POA: Diagnosis not present

## 2013-10-03 DIAGNOSIS — R1110 Vomiting, unspecified: Secondary | ICD-10-CM | POA: Diagnosis not present

## 2013-10-03 DIAGNOSIS — I5031 Acute diastolic (congestive) heart failure: Secondary | ICD-10-CM | POA: Diagnosis not present

## 2013-10-03 DIAGNOSIS — N200 Calculus of kidney: Secondary | ICD-10-CM | POA: Diagnosis not present

## 2013-10-03 DIAGNOSIS — E119 Type 2 diabetes mellitus without complications: Secondary | ICD-10-CM | POA: Diagnosis not present

## 2013-10-03 DIAGNOSIS — E46 Unspecified protein-calorie malnutrition: Secondary | ICD-10-CM | POA: Diagnosis not present

## 2013-10-03 DIAGNOSIS — K562 Volvulus: Secondary | ICD-10-CM | POA: Diagnosis not present

## 2013-10-03 DIAGNOSIS — I509 Heart failure, unspecified: Secondary | ICD-10-CM | POA: Diagnosis present

## 2013-10-03 DIAGNOSIS — I5032 Chronic diastolic (congestive) heart failure: Secondary | ICD-10-CM | POA: Diagnosis present

## 2013-10-03 DIAGNOSIS — F0280 Dementia in other diseases classified elsewhere without behavioral disturbance: Secondary | ICD-10-CM | POA: Diagnosis present

## 2013-10-03 DIAGNOSIS — K219 Gastro-esophageal reflux disease without esophagitis: Secondary | ICD-10-CM | POA: Diagnosis present

## 2013-10-03 DIAGNOSIS — E870 Hyperosmolality and hypernatremia: Secondary | ICD-10-CM | POA: Diagnosis present

## 2013-10-03 DIAGNOSIS — N390 Urinary tract infection, site not specified: Secondary | ICD-10-CM | POA: Diagnosis not present

## 2013-10-03 DIAGNOSIS — F0390 Unspecified dementia without behavioral disturbance: Secondary | ICD-10-CM | POA: Diagnosis not present

## 2013-10-03 DIAGNOSIS — E876 Hypokalemia: Secondary | ICD-10-CM | POA: Diagnosis not present

## 2013-10-03 DIAGNOSIS — D649 Anemia, unspecified: Secondary | ICD-10-CM | POA: Diagnosis not present

## 2013-10-03 DIAGNOSIS — K560 Paralytic ileus: Secondary | ICD-10-CM | POA: Diagnosis present

## 2013-10-03 DIAGNOSIS — E785 Hyperlipidemia, unspecified: Secondary | ICD-10-CM | POA: Diagnosis not present

## 2013-10-04 ENCOUNTER — Encounter: Payer: Self-pay | Admitting: Internal Medicine

## 2013-10-04 DIAGNOSIS — N179 Acute kidney failure, unspecified: Secondary | ICD-10-CM | POA: Insufficient documentation

## 2013-10-04 NOTE — Assessment & Plan Note (Signed)
I was called 4/29 with BMP - 142, 4.3, 113, 16,  BUN 49.8/Cr 3.12, 96. In March BUN 9/Cr 0.8; per the nurse pt has a birthday in several days and was looking forward to an outing. Pt is on lasix-will stop that until resolved. Pt is on Lisinopril 20 mg, will substitute hydralazine until resolved. Plan encourage fluid and get in 2 liter IVF at 75 cc/hr before Saturday 5/1. Will order BMP 5/2 Saturday and go from there.

## 2013-10-04 NOTE — Progress Notes (Signed)
MRN: [id number] Name: Maria Christian  Sex: female Age: 80 y.o. DOB: Aug 18, 1924  [place] #: [facility] Facility/Room: 220 Level Of Care: SNF Provider: Hennie Duos Emergency Contacts: Extended Emergency Contact Information Primary Emergency Contact: [name] [place] of [place] Phone: [phone] Relation: Son Secondary Emergency Contact: Roberts,Lynn Address: [address]          [place], [place] [address] [name] of [place] Phone: [phone] Mobile Phone: [phone] Relation: Daughter  Code Status: FULL  Allergies: Aspirin  Chief Complaint  Patient presents with  . family conference  . Acute Visit    HPI: Patient is 80 y.o. female who is being seen for ARF and whose son wants to have a conference.  Past Medical History  Diagnosis Date  . Atrial fibrillation   . Altered mental status   . Encephalopathy   . Parkinson disease   . Hypertension   . GERD (gastroesophageal reflux disease)   . Hyperlipemia   . Diabetes mellitus   . Coronary artery disease   . History of adenomatous polyp of colon 06/24/99  . Enlarged heart   . DEMENTIA   . Edema of lower extremity 07/13/11    right leg more swollen than left leg  . Esophageal dysmotility 07/02/12  . Horseshoe kidney   . Pancreatic lesion 05/22/11    no further workup  per PCP/family due to age  . Anemia   . Peripheral neuropathy   . Depression   . Thrombophlebitis     Past Surgical History  Procedure Laterality Date  . Shoulder surgery  2001    left clavicle excision and acromioplasty  . Abdominal hysterectomy    . Cholecystectomy    . Total hip arthroplasty    . Breast surgery      2 benign tumors removed left breast  . Foot surgery      benign tumors from foot  . Esophageal dilation      several times by Dr. [name]  . Nose surgery    . Esophagogastroduodenoscopy (egd) with esophageal dilation N/A 08/01/2012    Procedure: ESOPHAGOGASTRODUODENOSCOPY (EGD) WITH ESOPHAGEAL  DILATION;  Surgeon: [name], MD;  Location: [facility];  Service: Endoscopy;  Laterality: N/A;  with c-arm savory dilators  . Flexible sigmoidoscopy N/A 05/24/2013    Procedure: FLEXIBLE SIGMOIDOSCOPY;  Surgeon: [name], MD;  Location: [facility];  Service: Endoscopy;  Laterality: N/A;  . Flexible sigmoidoscopy N/A 05/26/2013    Procedure:  flex with decompression of sigmoid volvulus;  Surgeon: [name], MD;  Location: [facility];  Service: Endoscopy;  Laterality: N/A;      Medication List       This list is accurate as of: 10/03/13 11:59 PM.  Always use your most recent med list.               acetaminophen 325 MG tablet  Commonly known as:  TYLENOL  Take 650 mg by mouth 2 (two) times daily. For OA.     ALPRAZolam 0.25 MG tablet  Commonly known as:  XANAX  Take one tablet by mouth once daily at 2pm; Take one tablet by mouth at bedtime as needed; Take one tablet by mouth every 12 hours as needed     amLODipine 5 MG tablet  Commonly known as:  NORVASC  Take 5 mg by mouth daily.     aspirin 81 MG chewable tablet  Chew 81 mg by mouth daily.     carvedilol  3.125 MG tablet  Commonly known as:  COREG  Take 3.125 mg by mouth daily.     CERTAGEN PO  Take 1 tablet by mouth daily.     cholecalciferol 1000 UNITS tablet  Commonly known as:  VITAMIN D  Take 1,000 Units by mouth daily.     clopidogrel 75 MG tablet  Commonly known as:  PLAVIX  Take 75 mg by mouth daily.     Cranberry 475 MG Caps  Take 1 capsule by mouth 2 (two) times daily.     dextromethorphan-guaiFENesin 30-600 MG per 12 hr tablet  Commonly known as:  MUCINEX DM  Take 1 tablet by mouth every 12 (twelve) hours.     docusate sodium 100 MG capsule  Commonly known as:  COLACE  Take 1 capsule (100 mg total) by mouth 2 (two) times daily.     donepezil 5 MG tablet  Commonly known as:  ARICEPT  Take 5 mg by mouth at bedtime.     dorzolamide 2 % ophthalmic solution  Commonly known as:   TRUSOPT  Place 1 drop into both eyes 3 (three) times daily.     feeding supplement (ENSURE COMPLETE) Liqd  Take 237 mLs by mouth 2 (two) times daily between meals.     ferrous sulfate 325 (65 FE) MG tablet  Take 325 mg by mouth daily with breakfast.     furosemide 40 MG tablet  Commonly known as:  LASIX  Take 40 mg by mouth daily.     gabapentin 300 MG capsule  Commonly known as:  NEURONTIN  Take 300 mg by mouth 2 (two) times daily.     ibuprofen 400 MG tablet  Commonly known as:  ADVIL,MOTRIN  Take 400 mg by mouth daily at 12 noon.     ketotifen 0.025 % ophthalmic solution  Commonly known as:  ZADITOR  Place 1 drop into both eyes 2 (two) times daily.     lactulose 10 GM/15ML solution  Commonly known as:  CHRONULAC  Take 30 g by mouth 2 (two) times daily.     lidocaine 5 %  Commonly known as:  LIDODERM  Place 1 patch onto the skin daily. Apply 1 patch to left lower back/hip every morning.  Remove & Discard patch within 12 hours or as directed by MD     lisinopril 20 MG tablet  Commonly known as:  PRINIVIL,ZESTRIL  Take 1 tablet (20 mg total) by mouth daily.     loratadine 10 MG tablet  Commonly known as:  CLARITIN  Take 10 mg by mouth daily.     metFORMIN 1000 MG tablet  Commonly known as:  GLUCOPHAGE  Take 1,000 mg by mouth 2 (two) times daily with a meal.     ondansetron 4 MG tablet  Commonly known as:  ZOFRAN  Take 4 mg by mouth every 8 (eight) hours as needed for nausea or vomiting.     pantoprazole 40 MG tablet  Commonly known as:  PROTONIX  Take 40 mg by mouth 2 (two) times daily.     polyethylene glycol packet  Commonly known as:  MIRALAX / GLYCOLAX  Take 17 g by mouth daily.     potassium chloride SA 20 MEQ tablet  Commonly known as:  K-DUR,KLOR-CON  Take 20 mEq by mouth daily.     pravastatin 20 MG tablet  Commonly known as:  PRAVACHOL  Take 20 mg by mouth at bedtime.     sertraline 50 MG tablet  Commonly known as:  ZOLOFT  Take 50 mg by  mouth at bedtime.     SYSTANE OP  Place 1 drop into both eyes 4 (four) times daily.     Travoprost (BAK Free) 0.004 % Soln ophthalmic solution  Commonly known as:  TRAVATAN  Place 2 drops into both eyes at bedtime.     traZODone 50 MG tablet  Commonly known as:  DESYREL  Take 100 mg by mouth at bedtime.     vitamin B-12 500 MCG tablet  Commonly known as:  CYANOCOBALAMIN  Take 500 mcg by mouth daily.     vitamin C 500 MG tablet  Commonly known as:  ASCORBIC ACID  Take 500 mg by mouth daily.     VOLTAREN 1 % Gel  Generic drug:  diclofenac sodium  Apply 2 g topically 2 (two) times daily as needed (left neck pain).        No orders of the defined types were placed in this encounter.     There is no immunization history on file for this patient.  History  Substance Use Topics  . Smoking status: Former Research scientist (life sciences)  . Smokeless tobacco: Never Used  . Alcohol Use: No    Review of Systems  DATA OBTAINED: from patient, nurse, family member GENERAL: Feels better; no fevers, fatigue, taking po's well;family says she always drinks well SKIN: No itching, rash HEENT: No complaint;no pain RESPIRATORY: No cough, wheezing, SOB CARDIAC: No chest pain, palpitations, lower extremity edema  GI: No abdominal pain, Pt had had diarrhea earlier in the week, daughter in law thinks one loose stool a day and that has stopped GU: No dysuria, frequency or urgency, or incontinence  MUSCULOSKELETAL: No unrelieved bone/joint pain NEUROLOGIC: No headache, dizziness or focal weakness PSYCHIATRIC: No overt anxiety or sadness. Sleeps well.   Filed Vitals:   10/04/13 1417  BP: 101/68  Pulse: 70  Temp: 97.5 F (36.4 C)  Resp: 16    Physical Exam  GENERAL APPEARANCE: Alert, mod conversant. Appropriately groomed. No acute distress, smiling  SKIN: No diaphoresis rash, or wounds HEENT: Unremarkable RESPIRATORY: Breathing is even, unlabored. Lung sounds are clear   CARDIOVASCULAR: Heart RRR no  murmurs, rubs or gallops. No peripheral edema  GASTROINTESTINAL: Abdomen is soft, non-tender, not distended w/ normal bowel sounds.  GENITOURINARY: Bladder non tender, not distended  MUSCULOSKELETAL: No abnormal joints or musculature NEUROLOGIC: Cranial nerves 2-12 grossly intact. Moves all extremities no tremor. PSYCHIATRIC: Mood and affect appropriate to situation, no behavioral issues  Patient Active Problem List   Diagnosis Date Noted  . Acute renal failure 10/04/2013  . FTT (failure to thrive) in adult 07/29/2013  . Dehydration 07/25/2013  . Palliative care encounter 07/25/2013  . Weakness generalized 07/25/2013  . Hypotension 07/24/2013  . Volvulus of sigmoid colon 05/26/2013  . Pain in right shoulder joint 05/25/2013  . Sigmoid volvulus 05/24/2013  . Pulmonary hypertension 05/24/2013  . Sacral decubitus ulcer 05/24/2013  . HOH (hard of hearing) 05/24/2013  . TIA (transient ischemic attack) 12/19/2012  . Dementia 12/19/2012  . Insomnia 12/19/2012  . Depression 12/19/2012  . Constipation 12/19/2012  . Pancreatic mass 07/17/2011  . Dysphagia 07/17/2011  . Thrombocytopenia 07/16/2011  . E. coli UTI 07/13/2011  . History of adenomatous polyp of colon 07/02/2011  . Preventative health care 07/01/2011  . Hypertension   . GERD (gastroesophageal reflux disease)   . Hyperlipemia   . Diabetes mellitus   . Coronary artery disease   . Atrial fibrillation  with controlled ventricular response 05/24/2011  . Diastolic CHF, chronic 16/03/9603    CBC    Component Value Date/Time   WBC 6.0 07/27/2013 0400   RBC 2.54* 07/27/2013 0400   HGB 8.8* 07/27/2013 0400   HCT 25.7* 07/27/2013 0400   PLT 109* 07/27/2013 0400   MCV 101.2* 07/27/2013 0400   LYMPHSABS 1.2 07/24/2013 2028   MONOABS 0.5 07/24/2013 2028   EOSABS 0.2 07/24/2013 2028   BASOSABS 0.0 07/24/2013 2028    CMP     Component Value Date/Time   NA 144 07/27/2013 0400   K 3.7 07/27/2013 0400   CL 114* 07/27/2013 0400   CO2 18*  07/27/2013 0400   GLUCOSE 156* 07/27/2013 0400   BUN 12 07/27/2013 0400   CREATININE 0.81 07/27/2013 0400   CALCIUM 9.0 07/27/2013 0400   PROT 6.7 07/26/2013 0527   ALBUMIN 3.3* 07/26/2013 0527   AST 31 07/26/2013 0527   ALT 29 07/26/2013 0527   ALKPHOS 73 07/26/2013 0527   BILITOT 0.3 07/26/2013 0527   GFRNONAA 64* 07/27/2013 0400   GFRAA 74* 07/27/2013 0400    Assessment and Plan  Acute renal failure I was called 4/29 with BMP - 142, 4.3, 113, 16,  BUN 49.8/Cr 3.12, 96. In March BUN 9/Cr 0.8; per the nurse pt has a birthday in several days and was looking forward to an outing. Pt is on lasix-will stop that until resolved. Pt is on Lisinopril 20 mg, will substitute hydralazine until resolved. Plan encourage fluid and get in 2 liter IVF at 75 cc/hr before Saturday 5/1. Will order BMP 5/2 Saturday and go from there.  FAMILY CONFERENCE WITH PT PRESENT- pt's son had multiple questions-ex he didn't know Mom had dx CHF,I explained what that was, explained ARF and our plans; pt had c/o about pt's IV coming out and getting water on pt's arm and c/o nursing procedure, which I could not address; Son explained to me how ill MOM had been prior, which I already knew; I think pt's son was satisfied and I am happy that pt is looking good and drinking well  Hennie Duos, MD

## 2013-10-09 ENCOUNTER — Emergency Department (HOSPITAL_COMMUNITY): Payer: Medicare Other

## 2013-10-09 ENCOUNTER — Inpatient Hospital Stay (HOSPITAL_COMMUNITY)
Admission: EM | Admit: 2013-10-09 | Discharge: 2013-10-23 | DRG: 389 | Disposition: A | Discharge status: Skilled Nursing Facility | Payer: Medicare Other | Attending: Internal Medicine | Admitting: Internal Medicine

## 2013-10-09 ENCOUNTER — Encounter (HOSPITAL_COMMUNITY): Payer: Self-pay | Admitting: Emergency Medicine

## 2013-10-09 DIAGNOSIS — K56609 Unspecified intestinal obstruction, unspecified as to partial versus complete obstruction: Principal | ICD-10-CM | POA: Diagnosis present

## 2013-10-09 DIAGNOSIS — Z860100 Personal history of colon polyps, unspecified: Secondary | ICD-10-CM

## 2013-10-09 DIAGNOSIS — R531 Weakness: Secondary | ICD-10-CM

## 2013-10-09 DIAGNOSIS — N189 Chronic kidney disease, unspecified: Secondary | ICD-10-CM | POA: Diagnosis present

## 2013-10-09 DIAGNOSIS — N390 Urinary tract infection, site not specified: Secondary | ICD-10-CM | POA: Diagnosis not present

## 2013-10-09 DIAGNOSIS — N200 Calculus of kidney: Secondary | ICD-10-CM | POA: Diagnosis not present

## 2013-10-09 DIAGNOSIS — H9190 Unspecified hearing loss, unspecified ear: Secondary | ICD-10-CM

## 2013-10-09 DIAGNOSIS — R627 Adult failure to thrive: Secondary | ICD-10-CM

## 2013-10-09 DIAGNOSIS — I2510 Atherosclerotic heart disease of native coronary artery without angina pectoris: Secondary | ICD-10-CM | POA: Diagnosis present

## 2013-10-09 DIAGNOSIS — R4182 Altered mental status, unspecified: Secondary | ICD-10-CM | POA: Diagnosis present

## 2013-10-09 DIAGNOSIS — E46 Unspecified protein-calorie malnutrition: Secondary | ICD-10-CM | POA: Diagnosis not present

## 2013-10-09 DIAGNOSIS — E870 Hyperosmolality and hypernatremia: Secondary | ICD-10-CM | POA: Diagnosis not present

## 2013-10-09 DIAGNOSIS — Z9089 Acquired absence of other organs: Secondary | ICD-10-CM

## 2013-10-09 DIAGNOSIS — IMO0002 Reserved for concepts with insufficient information to code with codable children: Secondary | ICD-10-CM

## 2013-10-09 DIAGNOSIS — O10019 Pre-existing essential hypertension complicating pregnancy, unspecified trimester: Secondary | ICD-10-CM | POA: Diagnosis not present

## 2013-10-09 DIAGNOSIS — Z87891 Personal history of nicotine dependence: Secondary | ICD-10-CM | POA: Diagnosis not present

## 2013-10-09 DIAGNOSIS — K560 Paralytic ileus: Secondary | ICD-10-CM | POA: Diagnosis not present

## 2013-10-09 DIAGNOSIS — F329 Major depressive disorder, single episode, unspecified: Secondary | ICD-10-CM

## 2013-10-09 DIAGNOSIS — Z1331 Encounter for screening for depression: Secondary | ICD-10-CM | POA: Diagnosis not present

## 2013-10-09 DIAGNOSIS — I4891 Unspecified atrial fibrillation: Secondary | ICD-10-CM | POA: Diagnosis present

## 2013-10-09 DIAGNOSIS — K562 Volvulus: Secondary | ICD-10-CM | POA: Diagnosis not present

## 2013-10-09 DIAGNOSIS — A0472 Enterocolitis due to Clostridium difficile, not specified as recurrent: Secondary | ICD-10-CM | POA: Diagnosis not present

## 2013-10-09 DIAGNOSIS — G20 Parkinson's disease: Secondary | ICD-10-CM | POA: Diagnosis not present

## 2013-10-09 DIAGNOSIS — D649 Anemia, unspecified: Secondary | ICD-10-CM | POA: Diagnosis not present

## 2013-10-09 DIAGNOSIS — Z7982 Long term (current) use of aspirin: Secondary | ICD-10-CM

## 2013-10-09 DIAGNOSIS — E872 Acidosis, unspecified: Secondary | ICD-10-CM | POA: Diagnosis present

## 2013-10-09 DIAGNOSIS — D6489 Other specified anemias: Secondary | ICD-10-CM | POA: Diagnosis not present

## 2013-10-09 DIAGNOSIS — B9620 Unspecified Escherichia coli [E. coli] as the cause of diseases classified elsewhere: Secondary | ICD-10-CM

## 2013-10-09 DIAGNOSIS — R0989 Other specified symptoms and signs involving the circulatory and respiratory systems: Secondary | ICD-10-CM | POA: Diagnosis not present

## 2013-10-09 DIAGNOSIS — R112 Nausea with vomiting, unspecified: Secondary | ICD-10-CM | POA: Diagnosis not present

## 2013-10-09 DIAGNOSIS — D696 Thrombocytopenia, unspecified: Secondary | ICD-10-CM

## 2013-10-09 DIAGNOSIS — E876 Hypokalemia: Secondary | ICD-10-CM | POA: Diagnosis present

## 2013-10-09 DIAGNOSIS — F0280 Dementia in other diseases classified elsewhere without behavioral disturbance: Secondary | ICD-10-CM | POA: Diagnosis present

## 2013-10-09 DIAGNOSIS — R1110 Vomiting, unspecified: Secondary | ICD-10-CM | POA: Diagnosis not present

## 2013-10-09 DIAGNOSIS — I509 Heart failure, unspecified: Secondary | ICD-10-CM | POA: Diagnosis not present

## 2013-10-09 DIAGNOSIS — M25519 Pain in unspecified shoulder: Secondary | ICD-10-CM

## 2013-10-09 DIAGNOSIS — I959 Hypotension, unspecified: Secondary | ICD-10-CM

## 2013-10-09 DIAGNOSIS — E860 Dehydration: Secondary | ICD-10-CM | POA: Diagnosis not present

## 2013-10-09 DIAGNOSIS — G3183 Dementia with Lewy bodies: Secondary | ICD-10-CM

## 2013-10-09 DIAGNOSIS — D539 Nutritional anemia, unspecified: Secondary | ICD-10-CM | POA: Diagnosis present

## 2013-10-09 DIAGNOSIS — K5900 Constipation, unspecified: Secondary | ICD-10-CM

## 2013-10-09 DIAGNOSIS — I10 Essential (primary) hypertension: Secondary | ICD-10-CM | POA: Diagnosis not present

## 2013-10-09 DIAGNOSIS — R109 Unspecified abdominal pain: Secondary | ICD-10-CM | POA: Diagnosis not present

## 2013-10-09 DIAGNOSIS — Z8672 Personal history of thrombophlebitis: Secondary | ICD-10-CM

## 2013-10-09 DIAGNOSIS — Z96649 Presence of unspecified artificial hip joint: Secondary | ICD-10-CM | POA: Diagnosis not present

## 2013-10-09 DIAGNOSIS — R3989 Other symptoms and signs involving the genitourinary system: Secondary | ICD-10-CM | POA: Diagnosis present

## 2013-10-09 DIAGNOSIS — G459 Transient cerebral ischemic attack, unspecified: Secondary | ICD-10-CM

## 2013-10-09 DIAGNOSIS — K5989 Other specified functional intestinal disorders: Secondary | ICD-10-CM | POA: Diagnosis present

## 2013-10-09 DIAGNOSIS — E119 Type 2 diabetes mellitus without complications: Secondary | ICD-10-CM | POA: Diagnosis not present

## 2013-10-09 DIAGNOSIS — K567 Ileus, unspecified: Secondary | ICD-10-CM

## 2013-10-09 DIAGNOSIS — I129 Hypertensive chronic kidney disease with stage 1 through stage 4 chronic kidney disease, or unspecified chronic kidney disease: Secondary | ICD-10-CM | POA: Diagnosis present

## 2013-10-09 DIAGNOSIS — G4700 Insomnia, unspecified: Secondary | ICD-10-CM

## 2013-10-09 DIAGNOSIS — I5032 Chronic diastolic (congestive) heart failure: Secondary | ICD-10-CM | POA: Diagnosis not present

## 2013-10-09 DIAGNOSIS — L89159 Pressure ulcer of sacral region, unspecified stage: Secondary | ICD-10-CM

## 2013-10-09 DIAGNOSIS — N179 Acute kidney failure, unspecified: Secondary | ICD-10-CM | POA: Diagnosis present

## 2013-10-09 DIAGNOSIS — Z79899 Other long term (current) drug therapy: Secondary | ICD-10-CM

## 2013-10-09 DIAGNOSIS — I9589 Other hypotension: Secondary | ICD-10-CM | POA: Diagnosis not present

## 2013-10-09 DIAGNOSIS — F03918 Unspecified dementia, unspecified severity, with other behavioral disturbance: Secondary | ICD-10-CM | POA: Diagnosis present

## 2013-10-09 DIAGNOSIS — F0391 Unspecified dementia with behavioral disturbance: Secondary | ICD-10-CM | POA: Diagnosis present

## 2013-10-09 DIAGNOSIS — Z0000 Encounter for general adult medical examination without abnormal findings: Secondary | ICD-10-CM

## 2013-10-09 DIAGNOSIS — F32A Depression, unspecified: Secondary | ICD-10-CM

## 2013-10-09 DIAGNOSIS — E785 Hyperlipidemia, unspecified: Secondary | ICD-10-CM | POA: Diagnosis not present

## 2013-10-09 DIAGNOSIS — K219 Gastro-esophageal reflux disease without esophagitis: Secondary | ICD-10-CM | POA: Diagnosis present

## 2013-10-09 DIAGNOSIS — R141 Gas pain: Secondary | ICD-10-CM | POA: Diagnosis not present

## 2013-10-09 DIAGNOSIS — F0390 Unspecified dementia without behavioral disturbance: Secondary | ICD-10-CM

## 2013-10-09 DIAGNOSIS — N171 Acute kidney failure with acute cortical necrosis: Secondary | ICD-10-CM | POA: Diagnosis not present

## 2013-10-09 DIAGNOSIS — IMO0001 Reserved for inherently not codable concepts without codable children: Secondary | ICD-10-CM | POA: Diagnosis not present

## 2013-10-09 DIAGNOSIS — K8689 Other specified diseases of pancreas: Secondary | ICD-10-CM

## 2013-10-09 DIAGNOSIS — Z515 Encounter for palliative care: Secondary | ICD-10-CM

## 2013-10-09 DIAGNOSIS — R143 Flatulence: Secondary | ICD-10-CM | POA: Diagnosis not present

## 2013-10-09 DIAGNOSIS — K598 Other specified functional intestinal disorders: Secondary | ICD-10-CM

## 2013-10-09 DIAGNOSIS — I5031 Acute diastolic (congestive) heart failure: Secondary | ICD-10-CM | POA: Diagnosis not present

## 2013-10-09 DIAGNOSIS — Z860101 Personal history of adenomatous and serrated colon polyps: Secondary | ICD-10-CM

## 2013-10-09 DIAGNOSIS — E440 Moderate protein-calorie malnutrition: Secondary | ICD-10-CM | POA: Diagnosis not present

## 2013-10-09 DIAGNOSIS — I272 Pulmonary hypertension, unspecified: Secondary | ICD-10-CM

## 2013-10-09 LAB — URINE MICROSCOPIC-ADD ON

## 2013-10-09 LAB — CBC WITH DIFFERENTIAL/PLATELET
BASOS ABS: 0.1 10*3/uL (ref 0.0–0.1)
Basophils Relative: 1 % (ref 0–1)
EOS PCT: 1 % (ref 0–5)
Eosinophils Absolute: 0.1 10*3/uL (ref 0.0–0.7)
HCT: 23.3 % — ABNORMAL LOW (ref 36.0–46.0)
Hemoglobin: 7.8 g/dL — ABNORMAL LOW (ref 12.0–15.0)
Lymphocytes Relative: 14 % (ref 12–46)
Lymphs Abs: 0.8 10*3/uL (ref 0.7–4.0)
MCH: 33.3 pg (ref 26.0–34.0)
MCHC: 33.5 g/dL (ref 30.0–36.0)
MCV: 99.6 fL (ref 78.0–100.0)
Monocytes Absolute: 0.6 10*3/uL (ref 0.1–1.0)
Monocytes Relative: 10 % (ref 3–12)
NEUTROS PCT: 74 % (ref 43–77)
Neutro Abs: 4.2 10*3/uL (ref 1.7–7.7)
PLATELETS: 133 10*3/uL — AB (ref 150–400)
RBC: 2.34 MIL/uL — AB (ref 3.87–5.11)
RDW: 16.4 % — ABNORMAL HIGH (ref 11.5–15.5)
WBC: 5.8 10*3/uL (ref 4.0–10.5)

## 2013-10-09 LAB — URINALYSIS, ROUTINE W REFLEX MICROSCOPIC
Bilirubin Urine: NEGATIVE
GLUCOSE, UA: NEGATIVE mg/dL
HGB URINE DIPSTICK: NEGATIVE
Ketones, ur: NEGATIVE mg/dL
Nitrite: NEGATIVE
Protein, ur: 30 mg/dL — AB
SPECIFIC GRAVITY, URINE: 1.014 (ref 1.005–1.030)
Urobilinogen, UA: 0.2 mg/dL (ref 0.0–1.0)
pH: 5.5 (ref 5.0–8.0)

## 2013-10-09 LAB — POC OCCULT BLOOD, ED: Fecal Occult Bld: NEGATIVE

## 2013-10-09 LAB — APTT: aPTT: 31 seconds (ref 24–37)

## 2013-10-09 LAB — LIPASE, BLOOD: Lipase: 19 U/L (ref 11–59)

## 2013-10-09 LAB — PROTIME-INR
INR: 1.32 (ref 0.00–1.49)
PROTHROMBIN TIME: 16.1 s — AB (ref 11.6–15.2)

## 2013-10-09 LAB — TYPE AND SCREEN
ABO/RH(D): O POS
ANTIBODY SCREEN: NEGATIVE

## 2013-10-09 LAB — I-STAT CG4 LACTIC ACID, ED: LACTIC ACID, VENOUS: 0.61 mmol/L (ref 0.5–2.2)

## 2013-10-09 LAB — ABO/RH: ABO/RH(D): O POS

## 2013-10-09 MED ORDER — SODIUM CHLORIDE 0.9 % IV BOLUS (SEPSIS)
500.0000 mL | Freq: Once | INTRAVENOUS | Status: AC
  Administered 2013-10-09: 500 mL via INTRAVENOUS

## 2013-10-09 MED ORDER — IOHEXOL 300 MG/ML  SOLN
50.0000 mL | Freq: Once | INTRAMUSCULAR | Status: AC | PRN
  Administered 2013-10-09: 50 mL via ORAL

## 2013-10-09 MED ORDER — ONDANSETRON HCL 4 MG/2ML IJ SOLN
4.0000 mg | Freq: Four times a day (QID) | INTRAMUSCULAR | Status: DC | PRN
  Administered 2013-10-11 – 2013-10-23 (×9): 4 mg via INTRAVENOUS
  Filled 2013-10-09 (×9): qty 2

## 2013-10-09 MED ORDER — ONDANSETRON HCL 4 MG/2ML IJ SOLN
4.0000 mg | Freq: Once | INTRAMUSCULAR | Status: AC
  Administered 2013-10-09: 4 mg via INTRAVENOUS
  Filled 2013-10-09: qty 2

## 2013-10-09 MED ORDER — IOHEXOL 300 MG/ML  SOLN
80.0000 mL | Freq: Once | INTRAMUSCULAR | Status: AC | PRN
  Administered 2013-10-09: 80 mL via INTRAVENOUS

## 2013-10-09 MED ORDER — SODIUM CHLORIDE 0.9 % IV BOLUS (SEPSIS)
250.0000 mL | Freq: Once | INTRAVENOUS | Status: AC
  Administered 2013-10-09: 250 mL via INTRAVENOUS

## 2013-10-09 MED ORDER — POTASSIUM CHLORIDE 10 MEQ/100ML IV SOLN
10.0000 meq | Freq: Once | INTRAVENOUS | Status: DC
  Filled 2013-10-09: qty 100

## 2013-10-09 MED ORDER — SODIUM CHLORIDE 0.9 % IV SOLN: INTRAVENOUS | Status: DC

## 2013-10-09 NOTE — ED Notes (Signed)
Per EMS- Patient is a resident of [facility]. Patient was receiving IV fluids x 5 days. Staff reported that the patient has been having loose black stools, increased altered mental status. Staff reported that the patient has been vomiting since 0800 today.

## 2013-10-09 NOTE — ED Notes (Signed)
Report called @ 23:00 Patient delayed to floor for urine, CT and NG tube per MD

## 2013-10-09 NOTE — ED Notes (Signed)
Patient transported to X-ray 

## 2013-10-09 NOTE — ED Provider Notes (Signed)
CSN: [id number]     Arrival date & time 10/09/13  1809 History   First MD Initiated Contact with Patient 10/09/13 1825     Chief Complaint  Patient presents with  . Altered Mental Status  . Emesis     (Consider location/radiation/quality/duration/timing/severity/associated sxs/prior Treatment) Patient is a 80 y.o. female presenting with altered mental status and vomiting. The history is provided by the patient and a relative.  Altered Mental Status Associated symptoms: vomiting   Emesis  patient here with her family do to alter mental status. Patient has been dehydrated and according to records she has renal insufficiency. History with IV fluids and was doing well until 5 days ago when she began to have nonbilious emesis along with decreased altered mental status. No reported fever noted she has had watery diarrhea which is been dark. Family states that this is different from her baseline. Symptoms have been progressively worse. The IV fluids at the nursing home have not been effective. No reported cough noted. History of similar symptoms associated with bowel obstruction exam and family is concerned about that  currently.  Past Medical History  Diagnosis Date  . Atrial fibrillation   . Altered mental status   . Encephalopathy   . Parkinson disease   . Hypertension   . GERD (gastroesophageal reflux disease)   . Hyperlipemia   . Diabetes mellitus   . Coronary artery disease   . History of adenomatous polyp of colon 06/24/99  . Enlarged heart   . DEMENTIA   . Edema of lower extremity 07/13/11    right leg more swollen than left leg  . Esophageal dysmotility 07/02/12  . Horseshoe kidney   . Pancreatic lesion 05/22/11    no further workup  per PCP/family due to age  . Anemia   . Peripheral neuropathy   . Depression   . Thrombophlebitis    Past Surgical History  Procedure Laterality Date  . Shoulder surgery  2001    left clavicle excision and acromioplasty  . Abdominal  hysterectomy    . Cholecystectomy    . Total hip arthroplasty    . Breast surgery      2 benign tumors removed left breast  . Foot surgery      benign tumors from foot  . Esophageal dilation      several times by Dr. [name]  . Nose surgery    . Esophagogastroduodenoscopy (egd) with esophageal dilation N/A 08/01/2012    Procedure: ESOPHAGOGASTRODUODENOSCOPY (EGD) WITH ESOPHAGEAL DILATION;  Surgeon: [name], MD;  Location: [facility];  Service: Endoscopy;  Laterality: N/A;  with c-arm savory dilators  . Flexible sigmoidoscopy N/A 05/24/2013    Procedure: FLEXIBLE SIGMOIDOSCOPY;  Surgeon: [name], MD;  Location: [facility];  Service: Endoscopy;  Laterality: N/A;  . Flexible sigmoidoscopy N/A 05/26/2013    Procedure:  flex with decompression of sigmoid volvulus;  Surgeon: [name], MD;  Location: [facility];  Service: Endoscopy;  Laterality: N/A;   Family History  Problem Relation Age of Onset  . Cancer    . Heart disease     History  Substance Use Topics  . Smoking status: Former Research scientist (life sciences)  . Smokeless tobacco: Never Used  . Alcohol Use: No   OB History   Grav Para Term Preterm Abortions TAB SAB Ect Mult Living                 Review of Systems  Gastrointestinal: Positive for vomiting.  All  other systems reviewed and are negative.     Allergies  Aspirin  Home Medications   Prior to Admission medications   Medication Sig Start Date End Date Taking? Authorizing Provider  acetaminophen (TYLENOL) 500 MG tablet Take 1,000 mg by mouth 3 (three) times daily.   Yes Historical Provider, MD  ALPRAZolam Duanne Moron) 0.25 MG tablet Take one tablet by mouth once daily at 2pm; Take one tablet by mouth at bedtime as needed; Take one tablet by mouth every 12 hours as needed 07/29/13  Yes Estill Dooms, MD  amLODipine (NORVASC) 5 MG tablet Take 5 mg by mouth daily.   Yes Historical Provider, MD  aspirin 81 MG chewable tablet Chew 81 mg by mouth daily.   Yes  Historical Provider, MD  carvedilol (COREG) 3.125 MG tablet Take 3.125 mg by mouth daily.   Yes Historical Provider, MD  cholecalciferol (VITAMIN D) 1000 UNITS tablet Take 1,000 Units by mouth daily.   Yes Historical Provider, MD  clopidogrel (PLAVIX) 75 MG tablet Take 75 mg by mouth daily.    Yes Historical Provider, MD  Cranberry 475 MG CAPS Take 1 capsule by mouth 2 (two) times daily.   Yes Historical Provider, MD  cyclobenzaprine (FLEXERIL) 5 MG tablet Take 5 mg by mouth at bedtime.   Yes Historical Provider, MD  dextromethorphan-guaiFENesin (MUCINEX DM) 30-600 MG per 12 hr tablet Take 1 tablet by mouth 2 (two) times daily as needed for cough.    Yes Historical Provider, MD  diclofenac sodium (VOLTAREN) 1 % GEL Apply 2 g topically 2 (two) times daily as needed (left neck pain).    Yes Historical Provider, MD  donepezil (ARICEPT) 5 MG tablet Take 5 mg by mouth at bedtime.   Yes Historical Provider, MD  dorzolamide (TRUSOPT) 2 % ophthalmic solution Place 1 drop into both eyes 3 (three) times daily.    Yes Historical Provider, MD  ferrous sulfate 325 (65 FE) MG tablet Take 325 mg by mouth daily with breakfast.   Yes Historical Provider, MD  furosemide (LASIX) 40 MG tablet Take 40 mg by mouth daily.   Yes Historical Provider, MD  gabapentin (NEURONTIN) 300 MG capsule Take 300 mg by mouth 2 (two) times daily.   Yes Historical Provider, MD  hydrALAZINE (APRESOLINE) 10 MG tablet Take 10 mg by mouth 3 (three) times daily. HOLD if SBP <100/60 and pulse is less than 60   Yes Historical Provider, MD  ibuprofen (ADVIL,MOTRIN) 400 MG tablet Take 400 mg by mouth every 6 (six) hours as needed for moderate pain.    Yes Historical Provider, MD  ketotifen (ZADITOR) 0.025 % ophthalmic solution Place 1 drop into both eyes 2 (two) times daily.   Yes Historical Provider, MD  lactulose (CHRONULAC) 10 GM/15ML solution Take 30 g by mouth 2 (two) times daily.   Yes Historical Provider, MD  lidocaine (LIDODERM) 5 % Place  1 patch onto the skin daily. Apply 1 patch to left lower back/hip every morning.  Remove & Discard patch within 12 hours or as directed by MD 07/28/13  Yes Thurnell Lose, MD  loratadine (CLARITIN) 10 MG tablet Take 10 mg by mouth daily.   Yes Historical Provider, MD  metFORMIN (GLUCOPHAGE) 1000 MG tablet Take 1,000 mg by mouth 2 (two) times daily with a meal.   Yes Historical Provider, MD  Multiple Vitamins-Minerals (CERTAGEN PO) Take 1 tablet by mouth daily.    Yes Historical Provider, MD  ondansetron (ZOFRAN) 4 MG tablet Take 4  mg by mouth every 8 (eight) hours as needed for nausea or vomiting.   Yes Historical Provider, MD  pantoprazole (PROTONIX) 40 MG tablet Take 40 mg by mouth 2 (two) times daily.    Yes Historical Provider, MD  Polyethyl Glycol-Propyl Glycol (SYSTANE OP) Place 1 drop into both eyes 4 (four) times daily.   Yes Historical Provider, MD  polyethylene glycol (MIRALAX / GLYCOLAX) packet Take 17 g by mouth daily.   Yes Historical Provider, MD  potassium chloride SA (K-DUR,KLOR-CON) 20 MEQ tablet Take 40 mEq by mouth daily.    Yes Historical Provider, MD  pravastatin (PRAVACHOL) 20 MG tablet Take 20 mg by mouth at bedtime.    Yes Historical Provider, MD  sertraline (ZOLOFT) 50 MG tablet Take 50 mg by mouth daily.    Yes Historical Provider, MD  sodium bicarbonate 650 MG tablet Take 650 mg by mouth 2 (two) times daily.   Yes Historical Provider, MD  Travoprost, BAK Free, (TRAVATAN) 0.004 % SOLN ophthalmic solution Place 2 drops into both eyes at bedtime.    Yes Historical Provider, MD  traZODone (DESYREL) 50 MG tablet Take 100 mg by mouth at bedtime.    Yes Historical Provider, MD  vitamin B-12 (CYANOCOBALAMIN) 500 MCG tablet Take 500 mcg by mouth daily.   Yes Historical Provider, MD  vitamin C (ASCORBIC ACID) 500 MG tablet Take 500 mg by mouth daily.   Yes Historical Provider, MD   BP 141/72  Pulse 98  Temp(Src) 99.1 F (37.3 C) (Oral)  Resp 20  SpO2 98% Physical Exam   Nursing note and vitals reviewed. Constitutional: She appears well-developed and well-nourished. She appears lethargic.  Non-toxic appearance. No distress.  HENT:  Head: Normocephalic and atraumatic.  Eyes: Conjunctivae, EOM and lids are normal. Pupils are equal, round, and reactive to light.  Neck: Normal range of motion. Neck supple. No tracheal deviation present. No mass present.  Cardiovascular: Normal rate, regular rhythm and normal heart sounds.  Exam reveals no gallop.   No murmur heard. Pulmonary/Chest: Effort normal and breath sounds normal. No stridor. No respiratory distress. She has no decreased breath sounds. She has no wheezes. She has no rhonchi. She has no rales.  Abdominal: Soft. Normal appearance and bowel sounds are normal. She exhibits distension. She exhibits no ascites. There is tenderness. There is no rigidity, no rebound, no guarding and no CVA tenderness.  Musculoskeletal: Normal range of motion. She exhibits no edema and no tenderness.  Neurological: She appears lethargic. A cranial nerve deficit is present. GCS eye subscore is 4. GCS verbal subscore is 5. GCS motor subscore is 6.  Skin: Skin is warm and dry. No abrasion and no rash noted.  Psychiatric: Her affect is blunt. Her speech is delayed. She is slowed.    ED Course  Procedures (including critical care time) Labs Review Labs Reviewed  CBC WITH DIFFERENTIAL  COMPREHENSIVE METABOLIC PANEL  LIPASE, BLOOD  PROTIME-INR  APTT  URINALYSIS, ROUTINE W REFLEX MICROSCOPIC  TYPE AND SCREEN    Imaging Review No results found.   EKG Interpretation   Date/Time:  Thursday Oct 09 2013 18:24:09 EDT Ventricular Rate:  104 PR Interval:    QRS Duration: 90 QT Interval:  358 QTC Calculation: 471 R Axis:   51 Text Interpretation:  Atrial fibrillation Paired ventricular premature  complexes Low voltage, extremity leads Abnormal R-wave progression, late  transition Borderline repolarization abnormality Confirmed  by Zenia Resides  MD,  Azaylea Maves ([id number]) on 10/09/2013 7:20:48 PM  MDM   Final diagnoses:  None    Patient given IV fluids here. Will order dose of IV potassium. Will order abdominal CT and patient to be admitted to internal medicine service    Leota Jacobsen, MD 10/09/13 2144

## 2013-10-09 NOTE — ED Notes (Signed)
Bed: WA21 Expected date:  Expected time:  Means of arrival:  Comments: [facility] 

## 2013-10-09 NOTE — H&P (Signed)
Patient Demographics  Maria Christian, is a 80 y.o. female  MRN: [id number]   DOB - 06/06/1924  Admit Date - 10/09/2013  Outpatient Primary MD for the patient is Ezequiel Kayser, MD   With History of -  Past Medical History  Diagnosis Date  . Atrial fibrillation   . Altered mental status   . Encephalopathy   . Parkinson disease   . Hypertension   . GERD (gastroesophageal reflux disease)   . Hyperlipemia   . Diabetes mellitus   . Coronary artery disease   . History of adenomatous polyp of colon 06/24/99  . Enlarged heart   . DEMENTIA   . Edema of lower extremity 07/13/11    right leg more swollen than left leg  . Esophageal dysmotility 07/02/12  . Horseshoe kidney   . Pancreatic lesion 05/22/11    no further workup  per PCP/family due to age  . Anemia   . Peripheral neuropathy   . Depression   . Thrombophlebitis       Past Surgical History  Procedure Laterality Date  . Shoulder surgery  2001    left clavicle excision and acromioplasty  . Abdominal hysterectomy    . Cholecystectomy    . Total hip arthroplasty    . Breast surgery      2 benign tumors removed left breast  . Foot surgery      benign tumors from foot  . Esophageal dilation      several times by Dr. [name]  . Nose surgery    . Esophagogastroduodenoscopy (egd) with esophageal dilation N/A 08/01/2012    Procedure: ESOPHAGOGASTRODUODENOSCOPY (EGD) WITH ESOPHAGEAL DILATION;  Surgeon: [name], MD;  Location: [facility];  Service: Endoscopy;  Laterality: N/A;  with c-arm savory dilators  . Flexible sigmoidoscopy N/A 05/24/2013    Procedure: FLEXIBLE SIGMOIDOSCOPY;  Surgeon: [name], MD;  Location: [facility];  Service: Endoscopy;  Laterality: N/A;  . Flexible sigmoidoscopy N/A 05/26/2013    Procedure:  flex with  decompression of sigmoid volvulus;  Surgeon: [name], MD;  Location: [facility];  Service: Endoscopy;  Laterality: N/A;    in for   Chief Complaint  Patient presents with  . Altered Mental Status  . Emesis     HPI  Maria Christian  is a 80 y.o. female, with history of mild dementia, history of recurrent volvulus status post flexible sigmoidoscopy in December of last year, he presents with complaints of abdominal pain, nausea, vomiting and  watery diarrhea for the last 24 hours, patient is confused and lethargic, can't give any reliable history, she is daughter and sister at bedside to give history, they report patient had similar episode a few months ago where she was diagnosed with low levels, patient has been having watery small amount bowel movements, patient was Hemoccult negative, she is afebrile, complains of mild abdominal pain, patient hemoglobin is 7.8, she is Hemoccult negative, currently denies any coffee-ground emesis, as well patient workup was significant for mild acute renal failure, hypokalemia, and hypernatremia. Patient abdominal x-ray did show evidence of ileus versus bowel obstruction.    Review of Systems    Patient has dementia, confused, and can't provide reliable review of system, but in general she complains of abdominal pain, nausea and vomiting.   Social History History  Substance Use Topics  . Smoking status: Former Smoker    Quit date: 10/09/1964  . Smokeless tobacco: Never Used  . Alcohol Use: No    Family History Family History  Problem Relation Age of Onset  . Cancer    . Heart disease      Prior to Admission medications   Medication Sig Start Date End Date Taking? Authorizing Provider  acetaminophen (TYLENOL) 500 MG tablet Take 1,000 mg by mouth 3 (three) times daily.   Yes Historical Provider, MD  ALPRAZolam Duanne Moron) 0.25 MG tablet Take one tablet by mouth once daily at 2pm; Take one tablet by mouth at bedtime as needed; Take one tablet  by mouth every 12 hours as needed 07/29/13  Yes Estill Dooms, MD  amLODipine (NORVASC) 5 MG tablet Take 5 mg by mouth daily.   Yes Historical Provider, MD  aspirin 81 MG chewable tablet Chew 81 mg by mouth daily.   Yes Historical Provider, MD  carvedilol (COREG) 3.125 MG tablet Take 3.125 mg by mouth daily.   Yes Historical Provider, MD  cholecalciferol (VITAMIN D) 1000 UNITS tablet Take 1,000 Units by mouth daily.   Yes Historical Provider, MD  clopidogrel (PLAVIX) 75 MG tablet Take 75 mg by mouth daily.    Yes Historical Provider, MD  Cranberry 475 MG CAPS Take 1 capsule by mouth 2 (two) times daily.   Yes Historical Provider, MD  cyclobenzaprine (FLEXERIL) 5 MG tablet Take 5 mg by mouth at bedtime.   Yes Historical Provider, MD  dextromethorphan-guaiFENesin (MUCINEX DM) 30-600 MG per 12 hr tablet Take 1 tablet by mouth 2 (two) times daily as needed for cough.    Yes Historical Provider, MD  diclofenac sodium (VOLTAREN) 1 % GEL Apply 2 g topically 2 (two) times daily as needed (left neck pain).    Yes Historical Provider, MD  donepezil (ARICEPT) 5 MG tablet Take 5 mg by mouth at bedtime.   Yes Historical Provider, MD  dorzolamide (TRUSOPT) 2 % ophthalmic solution Place 1 drop into both eyes 3 (three) times daily.    Yes Historical Provider, MD  ferrous sulfate 325 (65 FE) MG tablet Take 325 mg by mouth daily with breakfast.   Yes Historical Provider, MD  furosemide (LASIX) 40 MG tablet Take 40 mg by mouth daily.   Yes Historical Provider, MD  gabapentin (NEURONTIN) 300 MG capsule Take 300 mg by mouth 2 (two) times daily.   Yes Historical Provider, MD  hydrALAZINE (APRESOLINE) 10 MG tablet Take 10 mg by mouth 3 (three) times daily. HOLD if SBP <100/60 and pulse is less than 60   Yes Historical Provider, MD  ibuprofen (ADVIL,MOTRIN) 400 MG tablet  Take 400 mg by mouth every 6 (six) hours as needed for moderate pain.    Yes Historical Provider, MD  ketotifen (ZADITOR) 0.025 % ophthalmic solution  Place 1 drop into both eyes 2 (two) times daily.   Yes Historical Provider, MD  lactulose (CHRONULAC) 10 GM/15ML solution Take 30 g by mouth 2 (two) times daily.   Yes Historical Provider, MD  lidocaine (LIDODERM) 5 % Place 1 patch onto the skin daily. Apply 1 patch to left lower back/hip every morning.  Remove & Discard patch within 12 hours or as directed by MD 07/28/13  Yes Leroy Sea, MD  loratadine (CLARITIN) 10 MG tablet Take 10 mg by mouth daily.   Yes Historical Provider, MD  metFORMIN (GLUCOPHAGE) 1000 MG tablet Take 1,000 mg by mouth 2 (two) times daily with a meal.   Yes Historical Provider, MD  Multiple Vitamins-Minerals (CERTAGEN PO) Take 1 tablet by mouth daily.    Yes Historical Provider, MD  ondansetron (ZOFRAN) 4 MG tablet Take 4 mg by mouth every 8 (eight) hours as needed for nausea or vomiting.   Yes Historical Provider, MD  pantoprazole (PROTONIX) 40 MG tablet Take 40 mg by mouth 2 (two) times daily.    Yes Historical Provider, MD  Polyethyl Glycol-Propyl Glycol (SYSTANE OP) Place 1 drop into both eyes 4 (four) times daily.   Yes Historical Provider, MD  polyethylene glycol (MIRALAX / GLYCOLAX) packet Take 17 g by mouth daily.   Yes Historical Provider, MD  potassium chloride SA (K-DUR,KLOR-CON) 20 MEQ tablet Take 40 mEq by mouth daily.    Yes Historical Provider, MD  pravastatin (PRAVACHOL) 20 MG tablet Take 20 mg by mouth at bedtime.    Yes Historical Provider, MD  sertraline (ZOLOFT) 50 MG tablet Take 50 mg by mouth daily.    Yes Historical Provider, MD  sodium bicarbonate 650 MG tablet Take 650 mg by mouth 2 (two) times daily.   Yes Historical Provider, MD  Travoprost, BAK Free, (TRAVATAN) 0.004 % SOLN ophthalmic solution Place 2 drops into both eyes at bedtime.    Yes Historical Provider, MD  traZODone (DESYREL) 50 MG tablet Take 100 mg by mouth at bedtime.    Yes Historical Provider, MD  vitamin B-12 (CYANOCOBALAMIN) 500 MCG tablet Take 500 mcg by mouth daily.   Yes  Historical Provider, MD  vitamin C (ASCORBIC ACID) 500 MG tablet Take 500 mg by mouth daily.   Yes Historical Provider, MD    Allergies  Allergen Reactions  . Aspirin Other (See Comments)    G.I. Upset only    Physical Exam  Vitals  Blood pressure 141/72, pulse 98, temperature 99.1 F (37.3 C), temperature source Oral, resp. rate 20, SpO2 98.00%.   1. General frail elderly female lying in bed in NAD,    2. patient is lethargic, communicative, but confused.  3. No F.N deficits, ALL C.Nerves Intact, Strength 5/5 all 4 extremities,Plantars down going.  4. Ears and Eyes appear Normal, Conjunctivae clear, PERRLA. Dry Oral Mucosa.  5. Supple Neck, No JVD, No cervical lymphadenopathy appriciated, No Carotid Bruits.  6. Symmetrical Chest wall movement, Good air movement bilaterally, CTAB.  7. RRR, No Gallops, Rubs or Murmurs, No Parasternal Heave.  8. hyperperistaltic Bowel Sounds, Abdomen  distended, mild diffuse tenderness to palpation, No organomegaly appriciated,No rebound -guarding or rigidity.  9.  No Cyanosis, Normal Skin Turgor, No Skin Rash or Bruise.  10. Good muscle tone,  joints appear normal , no effusions, Normal ROM.  11. No Palpable Lymph Nodes in Neck or Axillae    Data Review  CBC  Recent Labs Lab 10/09/13 1906  WBC 5.8  HGB 7.8*  HCT 23.3*  PLT 133*  MCV 99.6  MCH 33.3  MCHC 33.5  RDW 16.4*  LYMPHSABS 0.8  MONOABS 0.6  EOSABS 0.1  BASOSABS 0.1   ------------------------------------------------------------------------------------------------------------------  Chemistries   Recent Labs Lab 10/09/13 1906  NA 149*  K 2.9*  CL 123*  CO2 14*  GLUCOSE 145*  BUN 32*  CREATININE 1.47*  CALCIUM 9.5  AST 20  ALT 10  ALKPHOS 72  BILITOT 0.2*   ------------------------------------------------------------------------------------------------------------------ CrCl is unknown because both a height and weight (above a minimum accepted  value) are required for this calculation. ------------------------------------------------------------------------------------------------------------------ No results found for this basename: TSH, T4TOTAL, FREET3, T3FREE, THYROIDAB,  in the last 72 hours   Coagulation profile  Recent Labs Lab 10/09/13 1906  INR 1.32   ------------------------------------------------------------------------------------------------------------------- No results found for this basename: DDIMER,  in the last 72 hours -------------------------------------------------------------------------------------------------------------------  Cardiac Enzymes No results found for this basename: CK, CKMB, TROPONINI, MYOGLOBIN,  in the last 168 hours ------------------------------------------------------------------------------------------------------------------ No components found with this basename: POCBNP,    ---------------------------------------------------------------------------------------------------------------  Urinalysis    Component Value Date/Time   COLORURINE YELLOW 07/24/2013 0354   APPEARANCEUR TURBID* 07/24/2013 0354   LABSPEC 1.017 07/24/2013 0354   PHURINE 5.0 07/24/2013 0354   GLUCOSEU NEGATIVE 07/24/2013 0354   HGBUR MODERATE* 07/24/2013 0354   BILIRUBINUR NEGATIVE 07/24/2013 0354   KETONESUR NEGATIVE 07/24/2013 0354   PROTEINUR 100* 07/24/2013 0354   UROBILINOGEN 0.2 07/24/2013 0354   NITRITE NEGATIVE 07/24/2013 0354   LEUKOCYTESUR LARGE* 07/24/2013 0354    ----------------------------------------------------------------------------------------------------------------  Imaging results:   Dg Abd Acute W/chest  10/09/2013   CLINICAL DATA:  Abdominal pain, emesis and weakness.  EXAM: ACUTE ABDOMEN SERIES (ABDOMEN 2 VIEW & CHEST 1 VIEW)  COMPARISON:  DG ABD PORTABLE 1V dated 07/27/2013; DG ABD ACUTE W/CHEST dated 07/24/2013  FINDINGS: Cardiac silhouette is moderately enlarged, mediastinal  silhouette is tissues. Mild central pulmonary vasculature congestion interstitial prominence with patchy left lower lobe airspace opacity. No pleural effusions. No pneumothorax. Severely calcified thoracic aorta. Multiple EKG lines overlie the patient and may obscure subtle underlying pathology.  Diffuse colonic gaseous distention, sigmoid is 13 cm in transaxial dimension, previously 10 cm.  Surgical clips in the right upper quadrant. No intra-abdominal mass effect. Phleboliths in the pelvis. Status post left hip hemiarthroplasty.  IMPRESSION: Cardiomegaly, central pulmonary vasculature congestion with patchy left lower lobe airspace opacity which may reflect atelectasis or even early pneumonia.  Worsening gaseous distention of the colon, though this may reflect ileus, distal large bowel obstruction may have a similar appearance.   Electronically Signed   By: Elon Alas   On: 10/09/2013 20:27        Assessment & Plan  Active Problems:   Atrial fibrillation with controlled ventricular response   Diastolic CHF, chronic   Hyperlipemia   Diabetes mellitus   Dementia   Dehydration   Acute renal failure   Bowel obstruction   Anemia    1. abdominal pain, nausea, vomiting, this is most likely related to bowel obstruction/ possible volvulus, will obtain CT abdomen and pelvis with IV contrast, as patient not able to tolerate oral contrast as discussed with radiology, she will be kept n.p.o., we will start on NG tube for decompression, discussed with him about her GI on call, if this confirmed to be volvulus by the  CT abdomen and pelvis will request surgical consult as this is a common vulval is high and she minute colectomy, meanwhile we'll continue with when necessary pain and nausea medicine and hydration. 2. Acute renal failure: Due to dehydration and volume depletion, will continue with IV fluids 3. Hypernatremia: It due to volume depletion, will continue with normal saline 4. Hypokalemia:  Will replete most likely related to diarrhea 5. Anemia: Hemoccult negative, most likely anemia of chronic disease, resume iron supplements once stable 6. History of diastolic CHF chronic: Currently compensated, we'll hold on diuresis it due to her renal failure, will monitor closely as on gentle hydration 7. History of diabetes mellitus, hyperlipidemia, dementia, will resume back on her home meds once stable and able to tolerate oral intake.   DVT Prophylaxis Heparin -and SCDs  AM Labs Ordered, also please review Full Orders  Family Communication: Admission, patients condition and plan of care including tests being ordered have been discussed with the patient and daughter and sister who indicate understanding and agree with the plan and Code Status.  Code Status full code, son in [place] is her health care power of attorney  Likely DC to  skilled nursing facility  Condition critical  Time spent in minutes : 7 minutes    Phillips Climes M.D on 10/09/2013 at 10:41 PM   And look for the night coverage person covering me after hours  [facility] Office  [phone]

## 2013-10-09 NOTE — ED Notes (Signed)
Attempted in an out cath no output RN was notified. Will make a 2nd attempt when Liter of fluids is complete.Marland KitchenMarland Kitchen

## 2013-10-10 DIAGNOSIS — K560 Paralytic ileus: Secondary | ICD-10-CM | POA: Diagnosis not present

## 2013-10-10 DIAGNOSIS — R112 Nausea with vomiting, unspecified: Secondary | ICD-10-CM

## 2013-10-10 DIAGNOSIS — E860 Dehydration: Secondary | ICD-10-CM

## 2013-10-10 DIAGNOSIS — E876 Hypokalemia: Secondary | ICD-10-CM

## 2013-10-10 DIAGNOSIS — F0390 Unspecified dementia without behavioral disturbance: Secondary | ICD-10-CM | POA: Diagnosis not present

## 2013-10-10 DIAGNOSIS — E440 Moderate protein-calorie malnutrition: Secondary | ICD-10-CM | POA: Diagnosis present

## 2013-10-10 DIAGNOSIS — K56609 Unspecified intestinal obstruction, unspecified as to partial versus complete obstruction: Secondary | ICD-10-CM | POA: Diagnosis not present

## 2013-10-10 DIAGNOSIS — R109 Unspecified abdominal pain: Secondary | ICD-10-CM | POA: Diagnosis not present

## 2013-10-10 LAB — BASIC METABOLIC PANEL
BUN: 30 mg/dL — ABNORMAL HIGH (ref 6–23)
BUN: 25 mg/dL — ABNORMAL HIGH (ref 6–23)
CALCIUM: 9.4 mg/dL (ref 8.4–10.5)
CO2: 15 mEq/L — ABNORMAL LOW (ref 19–32)
CO2: 14 mEq/L — ABNORMAL LOW (ref 19–32)
Calcium: 9.2 mg/dL (ref 8.4–10.5)
Chloride: 122 mEq/L — ABNORMAL HIGH (ref 96–112)
Chloride: 127 mEq/L — ABNORMAL HIGH (ref 96–112)
Creatinine, Ser: 1.31 mg/dL — ABNORMAL HIGH (ref 0.50–1.10)
Creatinine, Ser: 1.28 mg/dL — ABNORMAL HIGH (ref 0.50–1.10)
GFR calc Af Amer: 41 mL/min — ABNORMAL LOW (ref >90)
GFR calc non Af Amer: 36 mL/min — ABNORMAL LOW (ref >90)
GFR, EST AFRICAN AMERICAN: 42 mL/min — AB (ref >90)
GFR, EST NON AFRICAN AMERICAN: 35 mL/min — AB (ref >90)
Glucose, Bld: 157 mg/dL — ABNORMAL HIGH (ref 70–99)
Glucose, Bld: 137 mg/dL — ABNORMAL HIGH (ref 70–99)
POTASSIUM: 2.7 meq/L — AB (ref 3.7–5.3)
POTASSIUM: 3.2 meq/L — AB (ref 3.7–5.3)
SODIUM: 153 meq/L — AB (ref 137–147)
Sodium: 149 mEq/L — ABNORMAL HIGH (ref 137–147)

## 2013-10-10 LAB — COMPREHENSIVE METABOLIC PANEL
ALT: 10 U/L (ref 0–35)
AST: 20 U/L (ref 0–37)
Albumin: 3.1 g/dL — ABNORMAL LOW (ref 3.5–5.2)
Alkaline Phosphatase: 72 U/L (ref 39–117)
BUN: 32 mg/dL — ABNORMAL HIGH (ref 6–23)
CALCIUM: 9.5 mg/dL (ref 8.4–10.5)
CO2: 14 mEq/L — ABNORMAL LOW (ref 19–32)
Chloride: 123 mEq/L — ABNORMAL HIGH (ref 96–112)
Creatinine, Ser: 1.47 mg/dL — ABNORMAL HIGH (ref 0.50–1.10)
GFR calc Af Amer: 36 mL/min — ABNORMAL LOW (ref >90)
GFR calc non Af Amer: 31 mL/min — ABNORMAL LOW (ref >90)
Glucose, Bld: 145 mg/dL — ABNORMAL HIGH (ref 70–99)
Potassium: 2.9 mEq/L — CL (ref 3.7–5.3)
Sodium: 149 mEq/L — ABNORMAL HIGH (ref 137–147)
TOTAL PROTEIN: 6.1 g/dL (ref 6.0–8.3)
Total Bilirubin: 0.2 mg/dL — ABNORMAL LOW (ref 0.3–1.2)

## 2013-10-10 LAB — CBC
HCT: 23.3 % — ABNORMAL LOW (ref 36.0–46.0)
HEMOGLOBIN: 7.9 g/dL — AB (ref 12.0–15.0)
MCH: 33.9 pg (ref 26.0–34.0)
MCHC: 33.9 g/dL (ref 30.0–36.0)
MCV: 100 fL (ref 78.0–100.0)
PLATELETS: 137 10*3/uL — AB (ref 150–400)
RBC: 2.33 MIL/uL — ABNORMAL LOW (ref 3.87–5.11)
RDW: 16.3 % — ABNORMAL HIGH (ref 11.5–15.5)
WBC: 6.2 10*3/uL (ref 4.0–10.5)

## 2013-10-10 LAB — MAGNESIUM: MAGNESIUM: 1.7 mg/dL (ref 1.5–2.5)

## 2013-10-10 LAB — MRSA PCR SCREENING: MRSA by PCR: NEGATIVE

## 2013-10-10 MED ORDER — HEPARIN SODIUM (PORCINE) 5000 UNIT/ML IJ SOLN
5000.0000 [IU] | Freq: Two times a day (BID) | INTRAMUSCULAR | Status: DC
  Administered 2013-10-10 – 2013-10-23 (×27): 5000 [IU] via SUBCUTANEOUS
  Filled 2013-10-10 (×28): qty 1

## 2013-10-10 MED ORDER — SODIUM CHLORIDE 0.9 % IV SOLN
INTRAVENOUS | Status: DC
  Administered 2013-10-10 – 2013-10-11 (×2): via INTRAVENOUS

## 2013-10-10 MED ORDER — POTASSIUM CHLORIDE 10 MEQ/100ML IV SOLN
10.0000 meq | INTRAVENOUS | Status: AC
  Administered 2013-10-10 (×6): 10 meq via INTRAVENOUS
  Filled 2013-10-10 (×6): qty 100

## 2013-10-10 MED ORDER — FLEET ENEMA 7-19 GM/118ML RE ENEM
1.0000 | ENEMA | Freq: Once | RECTAL | Status: AC
Start: 2013-10-10 — End: 2013-10-10
  Administered 2013-10-10: 1 via RECTAL
  Filled 2013-10-10: qty 1

## 2013-10-10 MED ORDER — DORZOLAMIDE HCL 2 % OP SOLN
1.0000 [drp] | Freq: Three times a day (TID) | OPHTHALMIC | Status: DC
  Administered 2013-10-10 – 2013-10-23 (×39): 1 [drp] via OPHTHALMIC
  Filled 2013-10-10: qty 10

## 2013-10-10 MED ORDER — MORPHINE SULFATE 2 MG/ML IJ SOLN
2.0000 mg | INTRAMUSCULAR | Status: DC | PRN
  Administered 2013-10-10 – 2013-10-11 (×4): 2 mg via INTRAVENOUS
  Filled 2013-10-10 (×4): qty 1

## 2013-10-10 MED ORDER — SODIUM CHLORIDE 0.9 % IJ SOLN
3.0000 mL | Freq: Two times a day (BID) | INTRAMUSCULAR | Status: DC
  Administered 2013-10-11 – 2013-10-23 (×13): 3 mL via INTRAVENOUS

## 2013-10-10 MED ORDER — ALBUTEROL SULFATE (2.5 MG/3ML) 0.083% IN NEBU: 2.5000 mg | INHALATION_SOLUTION | RESPIRATORY_TRACT | Status: DC | PRN

## 2013-10-10 MED ORDER — POTASSIUM CHLORIDE IN NACL 40-0.9 MEQ/L-% IV SOLN
INTRAVENOUS | Status: AC
Start: 2013-10-10 — End: 2013-10-10
  Administered 2013-10-10: 02:00:00 via INTRAVENOUS
  Filled 2013-10-10 (×2): qty 1000

## 2013-10-10 MED ORDER — LEVOFLOXACIN IN D5W 250 MG/50ML IV SOLN
250.0000 mg | INTRAVENOUS | Status: DC
  Administered 2013-10-10 – 2013-10-11 (×2): 250 mg via INTRAVENOUS
  Filled 2013-10-10 (×2): qty 50

## 2013-10-10 MED ORDER — LEVOFLOXACIN IN D5W 500 MG/100ML IV SOLN
500.0000 mg | INTRAVENOUS | Status: DC
  Administered 2013-10-10: 500 mg via INTRAVENOUS
  Filled 2013-10-10: qty 100

## 2013-10-10 MED ORDER — LATANOPROST 0.005 % OP SOLN
1.0000 [drp] | Freq: Every day | OPHTHALMIC | Status: DC
  Administered 2013-10-10 – 2013-10-22 (×14): 1 [drp] via OPHTHALMIC
  Filled 2013-10-10: qty 2.5

## 2013-10-10 MED ORDER — ALPRAZOLAM 0.25 MG PO TABS
0.2500 mg | ORAL_TABLET | Freq: Every evening | ORAL | Status: DC | PRN
  Administered 2013-10-10 – 2013-10-20 (×7): 0.25 mg via ORAL
  Filled 2013-10-10 (×7): qty 1

## 2013-10-10 MED ORDER — FLEET ENEMA 7-19 GM/118ML RE ENEM
1.0000 | ENEMA | Freq: Once | RECTAL | Status: AC
  Administered 2013-10-10: 1 via RECTAL
  Filled 2013-10-10: qty 1

## 2013-10-10 NOTE — Progress Notes (Signed)
Patient CT abdomen and pelvis was done, does not show any evidence of volvulus as discussed with radiology, possible colitis versus ileus, discussed with surgery we'll think it might be pseudo-obstruction or ileus, will recommend correction of her electrolyte imbalance as we are doing currently, discussed again with lumbar GI on call, who recommended electrolyte replacement, keep n.p.o. and keep NG tube on low intermittent suction and to give Fleet enema x2, daughter was updated about the findings

## 2013-10-10 NOTE — Progress Notes (Signed)
INITIAL NUTRITION ASSESSMENT  DOCUMENTATION CODES Per approved criteria  -Non-severe (moderate) malnutrition in the context of acute illness or injury  Pt meets criteria for moderate MALNUTRITION in the context of acute illness/injury as evidenced by 1.4% body weight loss in one week, <75% estimated nutrition needs for > 7 days.   INTERVENTION: -Recommend Dys1 diet advancement as tolerated -Recommend MightyShakes BID and MagicCup BID w/diet advancement -Diet advancement per MD  NUTRITION DIAGNOSIS: Inadequate oral intake related to nausea/mouth sores/abd distension as evidenced by PO intake <75% for two weeks.   Goal: Pt to meet >/= 90% of their estimated nutrition needs    Monitor:  Diet order, GI profile, labs, weights, skin integrity  Reason for Assessment: Low Braden/MST  80 y.o. female  Admitting Dx: <principal problem not specified>  ASSESSMENT: Maria Christian is a 80 y.o. female, with history of mild dementia, history of recurrent volvulus status post flexible sigmoidoscopy in December of last year, he presents with complaints of abdominal pain, nausea, vomiting and watery diarrhea for the last 24 hours  -Pt's daughter reported decreased appetite for two weeks -Had downgraded diet to puree 2-3 days ago as pt developed mouth sores and required softer foods to tolerate -Family had been encouraging different supplements-Mightyshakes, MagicCup and ice creams. Pt consumed supplements but with minimal PO intake of other meal items -Endorsed ongoing unintentional wt loss for past 5 months. Was unsure amount pt lost, however daughter indicated pt had been losing 1-2 lbs/week -Daughter noted pt with large amounts of stomach distension. Was given fleet enema on 5/08 with results -Currently NPO d/t possible colitis vs ileus vs bowel obstruction. NGT on low intermittent suction, no output documented -Elevated BUN/Crt d/t dehydration -Low K- being repleted -Mg WNL Nutrition Focused  Physical Exam:  Subcutaneous Fat:  Orbital Region: WDL Upper Arm Region: moderate Thoracic and Lumbar Region: n/a  Muscle:  Temple Region: moderate wasting Clavicle Bone Region: WDL Clavicle and Acromion Bone Region: WDL Scapular Bone Region: n/a Dorsal Hand: n/a Patellar Region: WDL Anterior Thigh Region: WDL Posterior Calf Region: n/a-pt's calves in compression   Edema: none noted    Height: Ht Readings from Last 1 Encounters:  10/10/13 5\' 8"  (1.727 m)    Weight: Wt Readings from Last 1 Encounters:  10/10/13 154 lb 5.2 oz (70 kg)    Ideal Body Weight: 140 lbs  % Ideal Body Weight: 110%  Wt Readings from Last 10 Encounters:  10/10/13 154 lb 5.2 oz (70 kg)  07/28/13 172 lb 8 oz (78.245 kg)  06/02/13 172 lb 2.9 oz (78.1 kg)  06/02/13 172 lb 2.9 oz (78.1 kg)  06/02/13 172 lb 2.9 oz (78.1 kg)  02/14/13 152 lb (68.947 kg)  12/19/12 153 lb 9.6 oz (69.673 kg)  08/01/12 169 lb (76.658 kg)  08/01/12 169 lb (76.658 kg)  07/13/11 169 lb 12.1 oz (77 kg)    Usual Body Weight: 142 lbs per daughter  % Usual Body Weight: 108%  BMI:  Body mass index is 23.47 kg/(m^2).  Estimated Nutritional Needs: (usual body weight used) Kcal: 1900-2100 Protein: 80-90 gram Fluid: >/=1900 ml/daily  Skin: three stage 2 pressure ulcers, and one stage I pressure ulcer on buttocks  Diet Order: NPO  EDUCATION NEEDS: -No education needs identified at this time   Intake/Output Summary (Last 24 hours) at 10/10/13 1042 Last data filed at 10/10/13 0600  Gross per 24 hour  Intake 922.08 ml  Output      0 ml  Net 922.08 ml  Last BM: 5/08   Labs:   Recent Labs Lab 10/09/13 1906 10/10/13 0100 10/10/13 0103  NA 149*  --  149*  K 2.9*  --  2.7*  CL 123*  --  122*  CO2 14*  --  15*  BUN 32*  --  30*  CREATININE 1.47*  --  1.31*  CALCIUM 9.5  --  9.4  MG  --  1.7  --   GLUCOSE 145*  --  157*    CBG (last 3)  No results found for this basename: GLUCAP,  in the last 72  hours  Scheduled Meds: . dorzolamide  1 drop Both Eyes TID  . heparin subcutaneous  5,000 Units Subcutaneous Q12H  . latanoprost  1 drop Both Eyes QHS  . levofloxacin (LEVAQUIN) IV  500 mg Intravenous Q24H  . sodium chloride  3 mL Intravenous Q12H    Continuous Infusions: . sodium chloride 75 mL/hr at 10/10/13 0033    Past Medical History  Diagnosis Date  . Atrial fibrillation   . Altered mental status   . Encephalopathy   . Parkinson disease   . Hypertension   . GERD (gastroesophageal reflux disease)   . Hyperlipemia   . Diabetes mellitus   . Coronary artery disease   . History of adenomatous polyp of colon 06/24/99  . Enlarged heart   . DEMENTIA   . Edema of lower extremity 07/13/11    right leg more swollen than left leg  . Esophageal dysmotility 07/02/12  . Horseshoe kidney   . Pancreatic lesion 05/22/11    no further workup  per PCP/family due to age  . Anemia   . Peripheral neuropathy   . Depression   . Thrombophlebitis     Past Surgical History  Procedure Laterality Date  . Shoulder surgery  2001    left clavicle excision and acromioplasty  . Abdominal hysterectomy    . Cholecystectomy    . Total hip arthroplasty    . Breast surgery      2 benign tumors removed left breast  . Foot surgery      benign tumors from foot  . Esophageal dilation      several times by Dr. [name]  . Nose surgery    . Esophagogastroduodenoscopy (egd) with esophageal dilation N/A 08/01/2012    Procedure: ESOPHAGOGASTRODUODENOSCOPY (EGD) WITH ESOPHAGEAL DILATION;  Surgeon: [name], MD;  Location: [facility];  Service: Endoscopy;  Laterality: N/A;  with c-arm savory dilators  . Flexible sigmoidoscopy N/A 05/24/2013    Procedure: FLEXIBLE SIGMOIDOSCOPY;  Surgeon: [name], MD;  Location: [facility];  Service: Endoscopy;  Laterality: N/A;  . Flexible sigmoidoscopy N/A 05/26/2013    Procedure:  flex with decompression of sigmoid volvulus;  Surgeon: [name],  MD;  Location: [facility];  Service: Endoscopy;  Laterality: N/A;    Atlee Abide MS RD LDN Clinical Dietitian [phone]

## 2013-10-10 NOTE — Progress Notes (Signed)
Pt tolerated clear liquids well today. Did not c/o nausea and vomiting.

## 2013-10-10 NOTE — Consult Note (Signed)
Patient seen, examined, and I agree with the above documentation, including the assessment and plan. Possibly infectious enteritis/colitis with n/v.  N/V has improved, NGT was clamped all day and she tolerated clears.  NGT removed late this evening Primary team working to correct K which will help gut motility.   No evidence of GI bleeding now No evidence for recurrent volvulus Supportive care

## 2013-10-10 NOTE — Progress Notes (Signed)
UR completed 

## 2013-10-10 NOTE — Progress Notes (Signed)
CRITICAL VALUE ALERT  Critical value received:  K-2.7  Date of notification:  10/10/13  Time of notification:  0157  Critical value read back:yes  Nurse who received alert:  Trinna Balloon  MD notified (1st page):  [place]  Time of first page:  0158

## 2013-10-10 NOTE — Progress Notes (Signed)
Son called Maria Christian is POA and would like all updates. Cell phone number is [phone]. Mr. Maria Christian would like an update each day from the MD before any major medical decisions are made.

## 2013-10-10 NOTE — Progress Notes (Addendum)
Patient is disoriented x4. Patients skin is dry and intact, with even hair distribution. Patient has continued to have loose stools, that seem to be tapering off. She has been NPO since admission, but nasogastric tube has been clamped off to see if she can tolerate clear liquids. Patient is resting well and is no longer shaking. Patient was been complaining of being cold, but has appeared comfortable. Lacretia Leigh, SN, [facility]

## 2013-10-10 NOTE — Consult Note (Signed)
Referring Provider: No ref. provider found Primary Care Physician:  Ezequiel Kayser, MD Primary Gastroenterologist:  Dr. Olevia Perches  Reason for Consultation:  Ileus  HPI: Maria Christian is a 80 y.o. female with history of dementia, history of recurrent volvulus status post flexible sigmoidoscopy in December of last year with decompression x 2, along with multiple other medication problems.   She presented to [facility] ED from her SNF with complaints of abdominal pain, nausea, vomiting, and watery diarrhea.  Her daughter and son-in-law are present at the time of my visit and provided all of the information as patient is confused.  Her daughter says that since Sunday she has been having watery dark colored diarrhea (patient was Hemoccult negative in the ED).  Her Hgb is low at 7.8 grams which is down 1 gram from when it was checked one month ago.  She says that she has gone down-hill (180 degrees) since Sunday.  Evaluation in the ED was significant for mild acute renal failure, hypokalemia (K+ 2.7 this AM), and the anemia as mentioned above. Patient abdominal x-ray did show evidence of ileus versus bowel obstruction, but CT revealed multiple bowel and colonic air-fluid levels within non-specific gaseous distention of the colon as can be seen with an ileus vs enterocolitis.  Lactic acid and lipase are normal.  NGT is in place at [facility] and she is NPO.  Has gloves on because she was trying to pull NGT out.  Is still very confused.  She was given 2 fleets enemas and had 3 large BM's following administration of those.   Past Medical History  Diagnosis Date  . Atrial fibrillation   . Altered mental status   . Encephalopathy   . Parkinson disease   . Hypertension   . GERD (gastroesophageal reflux disease)   . Hyperlipemia   . Diabetes mellitus   . Coronary artery disease   . History of adenomatous polyp of colon 06/24/99  . Enlarged heart   . DEMENTIA   . Edema of lower extremity 07/13/11    right leg more swollen  than left leg  . Esophageal dysmotility 07/02/12  . Horseshoe kidney   . Pancreatic lesion 05/22/11    no further workup  per PCP/family due to age  . Anemia   . Peripheral neuropathy   . Depression   . Thrombophlebitis     Past Surgical History  Procedure Laterality Date  . Shoulder surgery  2001    left clavicle excision and acromioplasty  . Abdominal hysterectomy    . Cholecystectomy    . Total hip arthroplasty    . Breast surgery      2 benign tumors removed left breast  . Foot surgery      benign tumors from foot  . Esophageal dilation      several times by Dr. [name]  . Nose surgery    . Esophagogastroduodenoscopy (egd) with esophageal dilation N/A 08/01/2012    Procedure: ESOPHAGOGASTRODUODENOSCOPY (EGD) WITH ESOPHAGEAL DILATION;  Surgeon: [name], MD;  Location: [facility] ENDOSCOPY;  Service: Endoscopy;  Laterality: N/A;  with c-arm savory dilators  . Flexible sigmoidoscopy N/A 05/24/2013    Procedure: FLEXIBLE SIGMOIDOSCOPY;  Surgeon: [name], MD;  Location: [facility] ENDOSCOPY;  Service: Endoscopy;  Laterality: N/A;  . Flexible sigmoidoscopy N/A 05/26/2013    Procedure:  flex with decompression of sigmoid volvulus;  Surgeon: [name], MD;  Location: [facility] ENDOSCOPY;  Service: Endoscopy;  Laterality: N/A;    Prior to Admission medications  Medication Sig Start Date End Date Taking? Authorizing Provider  acetaminophen (TYLENOL) 500 MG tablet Take 1,000 mg by mouth 3 (three) times daily.   Yes Historical Provider, MD  ALPRAZolam Duanne Moron) 0.25 MG tablet Take one tablet by mouth once daily at 2pm; Take one tablet by mouth at bedtime as needed; Take one tablet by mouth every 12 hours as needed 07/29/13  Yes Estill Dooms, MD  amLODipine (NORVASC) 5 MG tablet Take 5 mg by mouth daily.   Yes Historical Provider, MD  aspirin 81 MG chewable tablet Chew 81 mg by mouth daily.   Yes Historical Provider, MD  carvedilol (COREG) 3.125 MG tablet Take 3.125 mg by mouth daily.    Yes Historical Provider, MD  cholecalciferol (VITAMIN D) 1000 UNITS tablet Take 1,000 Units by mouth daily.   Yes Historical Provider, MD  clopidogrel (PLAVIX) 75 MG tablet Take 75 mg by mouth daily.    Yes Historical Provider, MD  Cranberry 475 MG CAPS Take 1 capsule by mouth 2 (two) times daily.   Yes Historical Provider, MD  cyclobenzaprine (FLEXERIL) 5 MG tablet Take 5 mg by mouth at bedtime.   Yes Historical Provider, MD  dextromethorphan-guaiFENesin (MUCINEX DM) 30-600 MG per 12 hr tablet Take 1 tablet by mouth 2 (two) times daily as needed for cough.    Yes Historical Provider, MD  diclofenac sodium (VOLTAREN) 1 % GEL Apply 2 g topically 2 (two) times daily as needed (left neck pain).    Yes Historical Provider, MD  donepezil (ARICEPT) 5 MG tablet Take 5 mg by mouth at bedtime.   Yes Historical Provider, MD  dorzolamide (TRUSOPT) 2 % ophthalmic solution Place 1 drop into both eyes 3 (three) times daily.    Yes Historical Provider, MD  ferrous sulfate 325 (65 FE) MG tablet Take 325 mg by mouth daily with breakfast.   Yes Historical Provider, MD  furosemide (LASIX) 40 MG tablet Take 40 mg by mouth daily.   Yes Historical Provider, MD  gabapentin (NEURONTIN) 300 MG capsule Take 300 mg by mouth 2 (two) times daily.   Yes Historical Provider, MD  hydrALAZINE (APRESOLINE) 10 MG tablet Take 10 mg by mouth 3 (three) times daily. HOLD if SBP <100/60 and pulse is less than 60   Yes Historical Provider, MD  ibuprofen (ADVIL,MOTRIN) 400 MG tablet Take 400 mg by mouth every 6 (six) hours as needed for moderate pain.    Yes Historical Provider, MD  ketotifen (ZADITOR) 0.025 % ophthalmic solution Place 1 drop into both eyes 2 (two) times daily.   Yes Historical Provider, MD  lactulose (CHRONULAC) 10 GM/15ML solution Take 30 g by mouth 2 (two) times daily.   Yes Historical Provider, MD  lidocaine (LIDODERM) 5 % Place 1 patch onto the skin daily. Apply 1 patch to left lower back/hip every morning.  Remove &  Discard patch within 12 hours or as directed by MD 07/28/13  Yes Thurnell Lose, MD  loratadine (CLARITIN) 10 MG tablet Take 10 mg by mouth daily.   Yes Historical Provider, MD  metFORMIN (GLUCOPHAGE) 1000 MG tablet Take 1,000 mg by mouth 2 (two) times daily with a meal.   Yes Historical Provider, MD  Multiple Vitamins-Minerals (CERTAGEN PO) Take 1 tablet by mouth daily.    Yes Historical Provider, MD  ondansetron (ZOFRAN) 4 MG tablet Take 4 mg by mouth every 8 (eight) hours as needed for nausea or vomiting.   Yes Historical Provider, MD  pantoprazole (PROTONIX) 40 MG  tablet Take 40 mg by mouth 2 (two) times daily.    Yes Historical Provider, MD  Polyethyl Glycol-Propyl Glycol (SYSTANE OP) Place 1 drop into both eyes 4 (four) times daily.   Yes Historical Provider, MD  polyethylene glycol (MIRALAX / GLYCOLAX) packet Take 17 g by mouth daily.   Yes Historical Provider, MD  potassium chloride SA (K-DUR,KLOR-CON) 20 MEQ tablet Take 40 mEq by mouth daily.    Yes Historical Provider, MD  pravastatin (PRAVACHOL) 20 MG tablet Take 20 mg by mouth at bedtime.    Yes Historical Provider, MD  sertraline (ZOLOFT) 50 MG tablet Take 50 mg by mouth daily.    Yes Historical Provider, MD  sodium bicarbonate 650 MG tablet Take 650 mg by mouth 2 (two) times daily.   Yes Historical Provider, MD  Travoprost, BAK Free, (TRAVATAN) 0.004 % SOLN ophthalmic solution Place 2 drops into both eyes at bedtime.    Yes Historical Provider, MD  traZODone (DESYREL) 50 MG tablet Take 100 mg by mouth at bedtime.    Yes Historical Provider, MD  vitamin B-12 (CYANOCOBALAMIN) 500 MCG tablet Take 500 mcg by mouth daily.   Yes Historical Provider, MD  vitamin C (ASCORBIC ACID) 500 MG tablet Take 500 mg by mouth daily.   Yes Historical Provider, MD    Current Facility-Administered Medications  Medication Dose Route Frequency Provider Last Rate Last Dose  . 0.9 %  sodium chloride infusion   Intravenous Continuous Phillips Climes, MD 75  mL/hr at 10/10/13 0033    . 0.9 % NaCl with KCl 40 mEq / L  infusion   Intravenous Continuous Phillips Climes, MD 100 mL/hr at 10/10/13 0152    . albuterol (PROVENTIL) (2.5 MG/3ML) 0.083% nebulizer solution 2.5 mg  2.5 mg Nebulization Q2H PRN Phillips Climes, MD      . ALPRAZolam Duanne Moron) tablet 0.25 mg  0.25 mg Oral QHS PRN Phillips Climes, MD   0.25 mg at 10/10/13 0052  . dorzolamide (TRUSOPT) 2 % ophthalmic solution 1 drop  1 drop Both Eyes TID Phillips Climes, MD      . heparin injection 5,000 Units  5,000 Units Subcutaneous Q12H Dawood Elgergawy, MD      . latanoprost (XALATAN) 0.005 % ophthalmic solution 1 drop  1 drop Both Eyes QHS Dawood Elgergawy, MD      . levofloxacin (LEVAQUIN) IVPB 500 mg  500 mg Intravenous Q24H Phillips Climes, MD   500 mg at 10/10/13 0139  . morphine 2 MG/ML injection 2 mg  2 mg Intravenous Q3H PRN Phillips Climes, MD   2 mg at 10/10/13 0852  . ondansetron (ZOFRAN) injection 4 mg  4 mg Intravenous Q6H PRN Dawood Elgergawy, MD      . sodium chloride 0.9 % injection 3 mL  3 mL Intravenous Q12H Phillips Climes, MD        Allergies as of 10/09/2013 - Review Complete 10/09/2013  Allergen Reaction Noted  . Aspirin Other (See Comments) 05/26/2013    Family History  Problem Relation Age of Onset  . Cancer    . Heart disease      History   Social History  . Marital Status: Widowed    Spouse Name: N/A    Number of Children: 57  . Years of Education: 10th   Occupational History  . Retired    Social History Main Topics  . Smoking status: Former Smoker    Quit date: 10/09/1964  . Smokeless tobacco: Never Used  . Alcohol Use: No  .  Drug Use: No  . Sexual Activity: No   Other Topics Concern  . Not on file   Social History Narrative   Pt lives at [facility] and [place].   Caffeine Use: very small amount daily    Review of Systems: Ten point ROS is O/W negative except as mentioned in HPI.  Physical Exam: Vital signs in last 24  hours: Temp:  [98.1 F (36.7 C)-99.1 F (37.3 C)] 98.1 F (36.7 C) (05/08 0500) Pulse Rate:  [89-98] 89 (05/08 0500) Resp:  [15-20] 16 (05/08 0500) BP: (112-141)/(50-90) 131/50 mmHg (05/08 0500) SpO2:  [95 %-99 %] 95 % (05/08 0500) Weight:  [154 lb 5.2 oz (70 kg)] 154 lb 5.2 oz (70 kg) (05/08 0110) Last BM Date: 10/10/13 General:   Alert, elderly and chronically ill-appearing, pleasant demented in NAD Head:  Normocephalic and atraumatic. Eyes:  Sclera clear, no icterus.   Conjunctiva pale. Ears:  HOH. Mouth:  No deformity or lesions.  Edentulous.  Lungs:  Clear throughout to auscultation.  No wheezes, crackles, or rhonchi.  Heart:  Regular rate and rhythm; no murmurs, clicks, rubs, or gallops. Abdomen:  Soft, slightly distended.  BS present.  Non-tender. Rectal:  Deferred.  Heme negative.  Msk:  Symmetrical without gross deformities. Pulses:  Normal pulses noted. Extremities:  Without clubbing or edema. Neurologic:  Alert;  grossly normal neurologically. Skin:  Intact without significant lesions or rashes.  Pale.  Intake/Output from previous day: 05/07 0701 - 05/08 0700 In: 922.1 [I.V.:822.1; IV Piggyback:100] Out: -   Lab Results:  Recent Labs  10/09/13 1906 10/10/13 0103  WBC 5.8 6.2  HGB 7.8* 7.9*  HCT 23.3* 23.3*  PLT 133* 137*   BMET  Recent Labs  10/09/13 1906 10/10/13 0103  NA 149* 149*  K 2.9* 2.7*  CL 123* 122*  CO2 14* 15*  GLUCOSE 145* 157*  BUN 32* 30*  CREATININE 1.47* 1.31*  CALCIUM 9.5 9.4   LFT  Recent Labs  10/09/13 1906  PROT 6.1  ALBUMIN 3.1*  AST 20  ALT 10  ALKPHOS 72  BILITOT 0.2*   PT/INR  Recent Labs  10/09/13 1906  LABPROT 16.1*  INR 1.32   Studies/Results: Ct Abdomen Pelvis W Contrast  10/09/2013   CLINICAL DATA:  Diarrhea, vomiting, abdominal pain  EXAM: CT ABDOMEN AND PELVIS WITH CONTRAST  TECHNIQUE: Multidetector CT imaging of the abdomen and pelvis was performed using the standard protocol following bolus  administration of intravenous contrast.  CONTRAST:  93mL OMNIPAQUE IOHEXOL 300 MG/ML SOLN, 26mL OMNIPAQUE IOHEXOL 300 MG/ML SOLN  COMPARISON:  DG ABD ACUTE W/CHEST dated 10/09/2013; CT ABD/PELV WO CM dated 07/24/2013  FINDINGS: There are bilateral small pleural effusions, right greater than left with bibasilar atelectasis. There is stable cardiomegaly.  The liver demonstrates no focal abnormality. There is no intrahepatic or extrahepatic biliary ductal dilatation. The gallbladder is surgically absent. The spleen demonstrates no focal abnormality.There is a horseshoe kidney with left nephrolithiasis. There is no obstructive uropathy. The adrenal glands and pancreas are normal. The bladder is unremarkable.  There is gaseous distension of the colon with multiple colonic air-fluid levels. There is no significant small bowel dilatation. There are multiple small bowel air-fluid levels. There is no pneumoperitoneum, pneumatosis, or portal venous gas. There is no abdominal or pelvic free fluid. There is no lymphadenopathy.  The abdominal aorta is normal in caliber with atherosclerosis.  There are no lytic or sclerotic osseous lesions. There is mild lumbar spine spondylosis.  IMPRESSION:  1. Multiple small bowel and colonic air-fluid levels with nonspecific gaseous distention of the colon. This appearance can be seen with an ileus versus better colitis.  2.  Small bilateral pleural effusions and bibasilar atelectasis.  3.  Horseshoe kidney.  Left nephrolithiasis.   Electronically Signed   By: Kathreen Devoid   On: 10/09/2013 23:57   Dg Abd Acute W/chest  10/09/2013   CLINICAL DATA:  Abdominal pain, emesis and weakness.  EXAM: ACUTE ABDOMEN SERIES (ABDOMEN 2 VIEW & CHEST 1 VIEW)  COMPARISON:  DG ABD PORTABLE 1V dated 07/27/2013; DG ABD ACUTE W/CHEST dated 07/24/2013  FINDINGS: Cardiac silhouette is moderately enlarged, mediastinal silhouette is tissues. Mild central pulmonary vasculature congestion interstitial prominence with  patchy left lower lobe airspace opacity. No pleural effusions. No pneumothorax. Severely calcified thoracic aorta. Multiple EKG lines overlie the patient and may obscure subtle underlying pathology.  Diffuse colonic gaseous distention, sigmoid is 13 cm in transaxial dimension, previously 10 cm.  Surgical clips in the right upper quadrant. No intra-abdominal mass effect. Phleboliths in the pelvis. Status post left hip hemiarthroplasty.  IMPRESSION: Cardiomegaly, central pulmonary vasculature congestion with patchy left lower lobe airspace opacity which may reflect atelectasis or even early pneumonia.  Worsening gaseous distention of the colon, though this may reflect ileus, distal large bowel obstruction may have a similar appearance.   Electronically Signed   By: Elon Alas   On: 10/09/2013 20:27    IMPRESSION:  -Possible ileus vs pseudo-obstruction:  ? Infectious enterocolitis with recent diarrhea.  No volvulus on CT scan.  -Significant hypokalemia:  Need to replete and keep K+ at 4.0 or >. -Acute on chronic kidney injury  -Acute on chronic anemia:  Hgb 7.8 grams, which is down 1 gram from one month ago.  Heme negative; no evidence of GI bleeding.   PLAN: -Remain NPO with NGT placed.  IVF's with anti-emetics if needed. -Correct metabolic derangements and monitor.   -Will check stool GI pathogen panel. -? Transfuse with unit of PRBC's.    Laban Emperor. Al Bracewell  10/10/2013, 9:00 AM  Pager number [phone]

## 2013-10-10 NOTE — Progress Notes (Signed)
Pt has had 3 large BMs for this RN last night. Pt in incontinent, but 2 out of 3 BMs were because of Fleet Enema use. RN and Tech have been checking patient for moisture due to 4 pressure ulcers (pt had them prior to arrival). 3 of the pressure ulcers are reddened areas of the skin. One is blister like in appearence and the skin is broken. Foam dressing was placed on the ulcers to keep them protected. Family aware of the wounds as they were there for the inspection. Family states that they are probably from the nursing home. Pt not complaining of pain, but shows signs of confusion. Pt is able to convey some needs. Pt is mainly asking for water. Pt informed of NPO status. Will continue to monitor.

## 2013-10-10 NOTE — Progress Notes (Signed)
[facility] PROGRESS NOTE  TAYLOR SPILDE XAJ:[id number] DOB: 02-Jan-1925 DOA: 10/09/2013 PCP: Ezequiel Kayser, MD  Assessment/Plan: Ileus vs SBO -Patient has had 3 large BMs since admission. -No n/v. -No NG output. -Will clamp tube today and start clears to see how she does (family anxious about feeding her). -Correct electrolyte abnormalities.  Hypokalemia -Replete IV.  ARF -Improved since admission. -Recheck BMET in am.  Anemia - No indication for acute transfusion.  Chronic Diastolic CHF -Compensated at present.  Code Status: Full Code Family Communication: patient only  Disposition Plan: Back to SNF when ready.   Consultants:  GI    Antibiotics:  None   Subjective: Sleeping, mittens in place.  Objective: Filed Vitals:   10/10/13 0110 10/10/13 0500 10/10/13 1100 10/10/13 1344  BP: 112/90 131/50 137/75 138/73  Pulse: 97 89 99 106  Temp: 98.8 F (37.1 C) 98.1 F (36.7 C) 98.5 F (36.9 C) 98.8 F (37.1 C)  TempSrc: Oral Oral Oral Oral  Resp: 16 16 18 16   Height: 5\' 8"  (1.727 m)     Weight: 70 kg (154 lb 5.2 oz)     SpO2: 96% 95% 97% 99%    Intake/Output Summary (Last 24 hours) at 10/10/13 1607 Last data filed at 10/10/13 0600  Gross per 24 hour  Intake 922.08 ml  Output      0 ml  Net 922.08 ml   Filed Weights   10/10/13 0110  Weight: 70 kg (154 lb 5.2 oz)    Exam:   General:  sleeping  Cardiovascular: RRR  Respiratory: CTA B  Abdomen: Soft, non-distended  Extremities: no C/C/E   Neurologic:  Non-focal  Data Reviewed: Basic Metabolic Panel:  Recent Labs Lab 10/09/13 1906 10/10/13 0100 10/10/13 0103  NA 149*  --  149*  K 2.9*  --  2.7*  CL 123*  --  122*  CO2 14*  --  15*  GLUCOSE 145*  --  157*  BUN 32*  --  30*  CREATININE 1.47*  --  1.31*  CALCIUM 9.5  --  9.4  MG  --  1.7  --    Liver Function Tests:  Recent Labs Lab 10/09/13 1906  AST 20  ALT 10  ALKPHOS 72  BILITOT 0.2*  PROT 6.1    ALBUMIN 3.1*    Recent Labs Lab 10/09/13 1906  LIPASE 19   No results found for this basename: AMMONIA,  in the last 168 hours CBC:  Recent Labs Lab 10/09/13 1906 10/10/13 0103  WBC 5.8 6.2  NEUTROABS 4.2  --   HGB 7.8* 7.9*  HCT 23.3* 23.3*  MCV 99.6 100.0  PLT 133* 137*   Cardiac Enzymes: No results found for this basename: CKTOTAL, CKMB, CKMBINDEX, TROPONINI,  in the last 168 hours BNP (last 3 results)  Recent Labs  05/24/13 0250  PROBNP 3915.0*   CBG: No results found for this basename: GLUCAP,  in the last 168 hours  Recent Results (from the past 240 hour(s))  MRSA PCR SCREENING     Status: None   Collection Time    10/10/13  3:04 AM      Result Value Ref Range Status   MRSA by PCR NEGATIVE  NEGATIVE Final   Comment:            The GeneXpert MRSA Assay (FDA     approved for NASAL specimens     only), is one component of a     comprehensive MRSA colonization  surveillance program. It is not     intended to diagnose MRSA     infection nor to guide or     monitor treatment for     MRSA infections.     Studies: Ct Abdomen Pelvis W Contrast  10/09/2013   CLINICAL DATA:  Diarrhea, vomiting, abdominal pain  EXAM: CT ABDOMEN AND PELVIS WITH CONTRAST  TECHNIQUE: Multidetector CT imaging of the abdomen and pelvis was performed using the standard protocol following bolus administration of intravenous contrast.  CONTRAST:  44mL OMNIPAQUE IOHEXOL 300 MG/ML SOLN, 1mL OMNIPAQUE IOHEXOL 300 MG/ML SOLN  COMPARISON:  DG ABD ACUTE W/CHEST dated 10/09/2013; CT ABD/PELV WO CM dated 07/24/2013  FINDINGS: There are bilateral small pleural effusions, right greater than left with bibasilar atelectasis. There is stable cardiomegaly.  The liver demonstrates no focal abnormality. There is no intrahepatic or extrahepatic biliary ductal dilatation. The gallbladder is surgically absent. The spleen demonstrates no focal abnormality.There is a horseshoe kidney with left nephrolithiasis.  There is no obstructive uropathy. The adrenal glands and pancreas are normal. The bladder is unremarkable.  There is gaseous distension of the colon with multiple colonic air-fluid levels. There is no significant small bowel dilatation. There are multiple small bowel air-fluid levels. There is no pneumoperitoneum, pneumatosis, or portal venous gas. There is no abdominal or pelvic free fluid. There is no lymphadenopathy.  The abdominal aorta is normal in caliber with atherosclerosis.  There are no lytic or sclerotic osseous lesions. There is mild lumbar spine spondylosis.  IMPRESSION: 1. Multiple small bowel and colonic air-fluid levels with nonspecific gaseous distention of the colon. This appearance can be seen with an ileus versus better colitis.  2.  Small bilateral pleural effusions and bibasilar atelectasis.  3.  Horseshoe kidney.  Left nephrolithiasis.   Electronically Signed   By: Kathreen Devoid   On: 10/09/2013 23:57   Dg Abd Acute W/chest  10/09/2013   CLINICAL DATA:  Abdominal pain, emesis and weakness.  EXAM: ACUTE ABDOMEN SERIES (ABDOMEN 2 VIEW & CHEST 1 VIEW)  COMPARISON:  DG ABD PORTABLE 1V dated 07/27/2013; DG ABD ACUTE W/CHEST dated 07/24/2013  FINDINGS: Cardiac silhouette is moderately enlarged, mediastinal silhouette is tissues. Mild central pulmonary vasculature congestion interstitial prominence with patchy left lower lobe airspace opacity. No pleural effusions. No pneumothorax. Severely calcified thoracic aorta. Multiple EKG lines overlie the patient and may obscure subtle underlying pathology.  Diffuse colonic gaseous distention, sigmoid is 13 cm in transaxial dimension, previously 10 cm.  Surgical clips in the right upper quadrant. No intra-abdominal mass effect. Phleboliths in the pelvis. Status post left hip hemiarthroplasty.  IMPRESSION: Cardiomegaly, central pulmonary vasculature congestion with patchy left lower lobe airspace opacity which may reflect atelectasis or even early pneumonia.   Worsening gaseous distention of the colon, though this may reflect ileus, distal large bowel obstruction may have a similar appearance.   Electronically Signed   By: Elon Alas   On: 10/09/2013 20:27    Scheduled Meds: . dorzolamide  1 drop Both Eyes TID  . heparin subcutaneous  5,000 Units Subcutaneous Q12H  . latanoprost  1 drop Both Eyes QHS  . levofloxacin (LEVAQUIN) IV  250 mg Intravenous Q24H  . potassium chloride  10 mEq Intravenous Q1 Hr x 6  . sodium chloride  3 mL Intravenous Q12H   Continuous Infusions: . sodium chloride 75 mL/hr at 10/10/13 0033    Principal Problem:   Bowel obstruction Active Problems:   Atrial fibrillation with controlled ventricular response  Diastolic CHF, chronic   Hyperlipemia   Diabetes mellitus   Dementia   Dehydration   Acute renal failure   Anemia   Malnutrition of moderate degree    Time spent: 35 minutes. Greater than 50% of this time was spent in direct contact with the patient coordinating care.    [facility] Pager [phone]  If 7PM-7AM, please contact night-coverage at [email or web address], password [facility] 10/10/2013, 4:07 PM  LOS: 1 day

## 2013-10-10 NOTE — ED Notes (Signed)
NG tube placed.

## 2013-10-10 NOTE — Progress Notes (Signed)
Clinical Social Work Department BRIEF PSYCHOSOCIAL ASSESSMENT 10/10/2013  Patient:  Maria Christian, Maria Christian     Account Number:  [id number]     Admit date:  10/09/2013  Clinical Social Worker:  Renold Genta  Date/Time:  10/10/2013 02:44 PM  Referred by:  Physician  Date Referred:  10/10/2013 Referred for  Other - See comment   Other Referral:   Admitted from: [place] type:  Family Other interview type:   patient's son, [name] via phone    PSYCHOSOCIAL DATA Living Status:  FACILITY Admitted from facility:  [facility] [place] Level of care:  [place] Primary support name:  Jerolyn Center (son) ph#: [phone] Primary support relationship to patient:  CHILD, ADULT Degree of support available:   good    CURRENT CONCERNS Current Concerns  Post-Acute Placement   Other Concerns:    SOCIAL WORK ASSESSMENT / PLAN CSW received consult that patient was admitted from [facility] SNF.   Assessment/plan status:  Information/Referral to [employer] Other assessment/ plan:   Information/referral to community resources:   CSW completed FL2 and faxed information to SNF, confirmed with [facility] SNF that they would be able to take patient back when ready.    PATIENT'S/FAMILY'S RESPONSE TO PLAN OF CARE: Patient's son is agreeable with plan for patient to return to [facility] SNF when ready - though currently, patient has an NG tube. CSW will follow-up Monday.       Raynaldo Opitz, [facility] Clinical Social Worker cell #: [phone]

## 2013-10-11 ENCOUNTER — Inpatient Hospital Stay (HOSPITAL_COMMUNITY): Payer: Medicare Other

## 2013-10-11 DIAGNOSIS — E870 Hyperosmolality and hypernatremia: Secondary | ICD-10-CM | POA: Diagnosis not present

## 2013-10-11 DIAGNOSIS — E860 Dehydration: Secondary | ICD-10-CM | POA: Diagnosis not present

## 2013-10-11 DIAGNOSIS — E876 Hypokalemia: Secondary | ICD-10-CM | POA: Diagnosis not present

## 2013-10-11 DIAGNOSIS — N179 Acute kidney failure, unspecified: Secondary | ICD-10-CM | POA: Diagnosis not present

## 2013-10-11 DIAGNOSIS — R109 Unspecified abdominal pain: Secondary | ICD-10-CM | POA: Diagnosis not present

## 2013-10-11 DIAGNOSIS — K560 Paralytic ileus: Secondary | ICD-10-CM | POA: Diagnosis not present

## 2013-10-11 DIAGNOSIS — D649 Anemia, unspecified: Secondary | ICD-10-CM | POA: Diagnosis not present

## 2013-10-11 LAB — CBC
HEMATOCRIT: 24.4 % — AB (ref 36.0–46.0)
HEMOGLOBIN: 8.2 g/dL — AB (ref 12.0–15.0)
MCH: 33.6 pg (ref 26.0–34.0)
MCHC: 33.6 g/dL (ref 30.0–36.0)
MCV: 100 fL (ref 78.0–100.0)
Platelets: 157 10*3/uL (ref 150–400)
RBC: 2.44 MIL/uL — ABNORMAL LOW (ref 3.87–5.11)
RDW: 16.8 % — AB (ref 11.5–15.5)
WBC: 7.1 10*3/uL (ref 4.0–10.5)

## 2013-10-11 LAB — BASIC METABOLIC PANEL
BUN: 24 mg/dL — ABNORMAL HIGH (ref 6–23)
CHLORIDE: 129 meq/L — AB (ref 96–112)
CO2: 12 mEq/L — ABNORMAL LOW (ref 19–32)
Calcium: 9.5 mg/dL (ref 8.4–10.5)
Creatinine, Ser: 1.19 mg/dL — ABNORMAL HIGH (ref 0.50–1.10)
GFR calc non Af Amer: 40 mL/min — ABNORMAL LOW (ref >90)
GFR, EST AFRICAN AMERICAN: 46 mL/min — AB (ref >90)
Glucose, Bld: 131 mg/dL — ABNORMAL HIGH (ref 70–99)
POTASSIUM: 3 meq/L — AB (ref 3.7–5.3)
Sodium: 152 mEq/L — ABNORMAL HIGH (ref 137–147)

## 2013-10-11 MED ORDER — POTASSIUM CHLORIDE CRYS ER 20 MEQ PO TBCR
40.0000 meq | EXTENDED_RELEASE_TABLET | ORAL | Status: AC
  Administered 2013-10-11 (×2): 40 meq via ORAL
  Filled 2013-10-11 (×2): qty 2

## 2013-10-11 MED ORDER — ACETAMINOPHEN 325 MG PO TABS
650.0000 mg | ORAL_TABLET | Freq: Four times a day (QID) | ORAL | Status: DC | PRN
  Administered 2013-10-14 – 2013-10-17 (×3): 650 mg via ORAL
  Filled 2013-10-11 (×3): qty 2

## 2013-10-11 MED ORDER — POTASSIUM CHLORIDE 10 MEQ/100ML IV SOLN
10.0000 meq | INTRAVENOUS | Status: DC
  Filled 2013-10-11 (×5): qty 100

## 2013-10-11 MED ORDER — DEXTROSE 5 % IV SOLN
INTRAVENOUS | Status: DC
  Administered 2013-10-11: 1 mL via INTRAVENOUS
  Administered 2013-10-12 – 2013-10-13 (×4): via INTRAVENOUS

## 2013-10-11 NOTE — Progress Notes (Signed)
Pt began moaning and groaning. Pt was repositioned and given morphine. Pt now sleeping. Pt no longer moaning. Will continue to monitor.

## 2013-10-11 NOTE — Progress Notes (Signed)
Pt son called. Updated on patient plan of care. Son really would like to speak to a physician. Will pass along the message to MD in AM. Thanks nursing

## 2013-10-11 NOTE — Progress Notes (Addendum)
[facility] PROGRESS NOTE  Maria Christian KPT:[id number] DOB: November 09, 1924 DOA: 10/09/2013 PCP: Ezequiel Kayser, MD  Assessment/Plan: Ileus vs SBO -Patient has had 3 large BMs since admission. -No n/v. -Tube is out and she is tolerating clears. -Will continue to advance diet as tolerated. -Correct electrolyte abnormalities.  Hypokalemia -Replete PO. -Will check Mg levels.  Hypernatremia -Likely related to free water deficit from decreased PO intake. -Change IVF to D5 and recheck in am.  ARF -Cr continues to improve. -Likely 2/2 prerenal azotemia and dehydration. -Recheck BMET in am.  Anemia - No indication for acute transfusion.  Chronic Diastolic CHF -Compensated at present.  Code Status: Full Code Family Communication: Daughter [name] via telephone. Disposition Plan: Back to SNF when ready; likely 24-48 hours.   Consultants:  GI    Antibiotics:  None   Subjective: Sleeping, mittens in place.  Objective: Filed Vitals:   10/10/13 1344 10/10/13 2138 10/11/13 0313 10/11/13 0551  BP: 138/73 153/95  135/88  Pulse: 106 99  99  Temp: 98.8 F (37.1 C) 98.9 F (37.2 C)  98.7 F (37.1 C)  TempSrc: Oral Oral  Oral  Resp: 16 18  18   Height:      Weight:   71.2 kg (156 lb 15.5 oz) 71.1 kg (156 lb 12 oz)  SpO2: 99% 99%  99%    Intake/Output Summary (Last 24 hours) at 10/11/13 1315 Last data filed at 10/11/13 0700  Gross per 24 hour  Intake   3125 ml  Output      0 ml  Net   3125 ml   Filed Weights   10/10/13 0110 10/11/13 0313 10/11/13 0551  Weight: 70 kg (154 lb 5.2 oz) 71.2 kg (156 lb 15.5 oz) 71.1 kg (156 lb 12 oz)    Exam:   General:  sleeping  Cardiovascular: RRR  Respiratory: CTA B  Abdomen: Soft, non-distended  Extremities: no C/C/E   Neurologic:  Non-focal  Data Reviewed: Basic Metabolic Panel:  Recent Labs Lab 10/09/13 1906 10/10/13 0100 10/10/13 0103 10/10/13 1806 10/11/13 0503  NA 149*  --  149* 153* 152*  K  2.9*  --  2.7* 3.2* 3.0*  CL 123*  --  122* 127* 129*  CO2 14*  --  15* 14* 12*  GLUCOSE 145*  --  157* 137* 131*  BUN 32*  --  30* 25* 24*  CREATININE 1.47*  --  1.31* 1.28* 1.19*  CALCIUM 9.5  --  9.4 9.2 9.5  MG  --  1.7  --   --   --    Liver Function Tests:  Recent Labs Lab 10/09/13 1906  AST 20  ALT 10  ALKPHOS 72  BILITOT 0.2*  PROT 6.1  ALBUMIN 3.1*    Recent Labs Lab 10/09/13 1906  LIPASE 19   No results found for this basename: AMMONIA,  in the last 168 hours CBC:  Recent Labs Lab 10/09/13 1906 10/10/13 0103 10/11/13 0503  WBC 5.8 6.2 7.1  NEUTROABS 4.2  --   --   HGB 7.8* 7.9* 8.2*  HCT 23.3* 23.3* 24.4*  MCV 99.6 100.0 100.0  PLT 133* 137* 157   Cardiac Enzymes: No results found for this basename: CKTOTAL, CKMB, CKMBINDEX, TROPONINI,  in the last 168 hours BNP (last 3 results)  Recent Labs  05/24/13 0250  PROBNP 3915.0*   CBG: No results found for this basename: GLUCAP,  in the last 168 hours  Recent Results (from the past 240 hour(s))  MRSA PCR SCREENING     Status: None   Collection Time    10/10/13  3:04 AM      Result Value Ref Range Status   MRSA by PCR NEGATIVE  NEGATIVE Final   Comment:            The GeneXpert MRSA Assay (FDA     approved for NASAL specimens     only), is one component of a     comprehensive MRSA colonization     surveillance program. It is not     intended to diagnose MRSA     infection nor to guide or     monitor treatment for     MRSA infections.     Studies: Ct Abdomen Pelvis W Contrast  10/09/2013   CLINICAL DATA:  Diarrhea, vomiting, abdominal pain  EXAM: CT ABDOMEN AND PELVIS WITH CONTRAST  TECHNIQUE: Multidetector CT imaging of the abdomen and pelvis was performed using the standard protocol following bolus administration of intravenous contrast.  CONTRAST:  50mL OMNIPAQUE IOHEXOL 300 MG/ML SOLN, 45mL OMNIPAQUE IOHEXOL 300 MG/ML SOLN  COMPARISON:  DG ABD ACUTE W/CHEST dated 10/09/2013; CT ABD/PELV WO  CM dated 07/24/2013  FINDINGS: There are bilateral small pleural effusions, right greater than left with bibasilar atelectasis. There is stable cardiomegaly.  The liver demonstrates no focal abnormality. There is no intrahepatic or extrahepatic biliary ductal dilatation. The gallbladder is surgically absent. The spleen demonstrates no focal abnormality.There is a horseshoe kidney with left nephrolithiasis. There is no obstructive uropathy. The adrenal glands and pancreas are normal. The bladder is unremarkable.  There is gaseous distension of the colon with multiple colonic air-fluid levels. There is no significant small bowel dilatation. There are multiple small bowel air-fluid levels. There is no pneumoperitoneum, pneumatosis, or portal venous gas. There is no abdominal or pelvic free fluid. There is no lymphadenopathy.  The abdominal aorta is normal in caliber with atherosclerosis.  There are no lytic or sclerotic osseous lesions. There is mild lumbar spine spondylosis.  IMPRESSION: 1. Multiple small bowel and colonic air-fluid levels with nonspecific gaseous distention of the colon. This appearance can be seen with an ileus versus better colitis.  2.  Small bilateral pleural effusions and bibasilar atelectasis.  3.  Horseshoe kidney.  Left nephrolithiasis.   Electronically Signed   By: Kathreen Devoid   On: 10/09/2013 23:57   Dg Abd Acute W/chest  10/09/2013   CLINICAL DATA:  Abdominal pain, emesis and weakness.  EXAM: ACUTE ABDOMEN SERIES (ABDOMEN 2 VIEW & CHEST 1 VIEW)  COMPARISON:  DG ABD PORTABLE 1V dated 07/27/2013; DG ABD ACUTE W/CHEST dated 07/24/2013  FINDINGS: Cardiac silhouette is moderately enlarged, mediastinal silhouette is tissues. Mild central pulmonary vasculature congestion interstitial prominence with patchy left lower lobe airspace opacity. No pleural effusions. No pneumothorax. Severely calcified thoracic aorta. Multiple EKG lines overlie the patient and may obscure subtle underlying pathology.   Diffuse colonic gaseous distention, sigmoid is 13 cm in transaxial dimension, previously 10 cm.  Surgical clips in the right upper quadrant. No intra-abdominal mass effect. Phleboliths in the pelvis. Status post left hip hemiarthroplasty.  IMPRESSION: Cardiomegaly, central pulmonary vasculature congestion with patchy left lower lobe airspace opacity which may reflect atelectasis or even early pneumonia.  Worsening gaseous distention of the colon, though this may reflect ileus, distal large bowel obstruction may have a similar appearance.   Electronically Signed   By: Elon Alas   On: 10/09/2013 20:27    Scheduled Meds: . dorzolamide  1 drop Both Eyes TID  . heparin subcutaneous  5,000 Units Subcutaneous Q12H  . latanoprost  1 drop Both Eyes QHS  . levofloxacin (LEVAQUIN) IV  250 mg Intravenous Q24H  . sodium chloride  3 mL Intravenous Q12H   Continuous Infusions: . dextrose 1 mL (10/11/13 1006)    Principal Problem:   Bowel obstruction Active Problems:   Atrial fibrillation with controlled ventricular response   Diastolic CHF, chronic   Hyperlipemia   Diabetes mellitus   Dementia   Dehydration   Acute renal failure   Anemia   Malnutrition of moderate degree    Time spent: 35 minutes. Greater than 50% of this time was spent in direct contact with the patient coordinating care.    [place] [facility] Pager [phone]  If 7PM-7AM, please contact night-coverage at [email or web address], password [facility] 10/11/2013, 1:15 PM  LOS: 2 days

## 2013-10-11 NOTE — Progress Notes (Signed)
Patient son is POA and would like to be contacted. His number is [phone]. Thanks nursing.

## 2013-10-11 NOTE — Progress Notes (Signed)
Pt had uneventful night. Incont x 1, but bath and peri care completed. Pt rested and only complained of some minor pain before going to sleep based on PAINAD scale. Pt verbalizing a more than last night, but words are still dysarthric. Pt no longer pulling at NG due to removal. Mitten kept on hand with IV to protect it. Pt turned Q2H to relieve pressure on pressure ulcers that were present upon arrival. Will continue to monitor.

## 2013-10-11 NOTE — Progress Notes (Signed)
Progress Note   Subjective  Patient more alert this morning Reports back is hurting because "feels like I'm laying on something" (I lifted the patient in bed and removed telemetry box which was behind her right mid back), she reported it felt "better" Asking for water and complaining of thirst Denies abdominal pain Per nursing note morphine just before 6 AM for "groaning"   Objective  Vital signs in last 24 hours: Temp:  [98.5 F (36.9 C)-98.9 F (37.2 C)] 98.7 F (37.1 C) (05/09 0551) Pulse Rate:  [99-106] 99 (05/09 0551) Resp:  [16-18] 18 (05/09 0551) BP: (135-153)/(73-95) 135/88 mmHg (05/09 0551) SpO2:  [97 %-99 %] 99 % (05/09 0551) Weight:  [156 lb 12 oz (71.1 kg)-156 lb 15.5 oz (71.2 kg)] 156 lb 12 oz (71.1 kg) (05/09 0551) Last BM Date: 10/10/13 Gen: Elderly-appearing female in no acute distress HEENT: anicteric, very dry mouth CV: RRR Pulm: CTA b/l anteriorly Abd: soft, NT, moderate distention, hypoactive but present bowel sounds Ext: no c/c/e Neuro: nonfocal   Intake/Output from previous day: 05/08 0701 - 05/09 0700 In: 3005 [P.O.:480; I.V.:1875; IV Piggyback:650] Out: -  Intake/Output this shift:    Lab Results:  Recent Labs  10/09/13 1906 10/10/13 0103 10/11/13 0503  WBC 5.8 6.2 7.1  HGB 7.8* 7.9* 8.2*  HCT 23.3* 23.3* 24.4*  PLT 133* 137* 157   BMET  Recent Labs  10/10/13 0103 10/10/13 1806 10/11/13 0503  NA 149* 153* 152*  K 2.7* 3.2* 3.0*  CL 122* 127* 129*  CO2 15* 14* 12*  GLUCOSE 157* 137* 131*  BUN 30* 25* 24*  CREATININE 1.31* 1.28* 1.19*  CALCIUM 9.4 9.2 9.5   LFT  Recent Labs  10/09/13 1906  PROT 6.1  ALBUMIN 3.1*  AST 20  ALT 10  ALKPHOS 72  BILITOT 0.2*   PT/INR  Recent Labs  10/09/13 1906  LABPROT 16.1*  INR 1.32   Hepatitis Panel No results found for this basename: HEPBSAG, HCVAB, HEPAIGM, HEPBIGM,  in the last 72 hours  Studies/Results: Ct Abdomen Pelvis W Contrast  10/09/2013   CLINICAL DATA:   Diarrhea, vomiting, abdominal pain  EXAM: CT ABDOMEN AND PELVIS WITH CONTRAST  TECHNIQUE: Multidetector CT imaging of the abdomen and pelvis was performed using the standard protocol following bolus administration of intravenous contrast.  CONTRAST:  56mL OMNIPAQUE IOHEXOL 300 MG/ML SOLN, 46mL OMNIPAQUE IOHEXOL 300 MG/ML SOLN  COMPARISON:  DG ABD ACUTE W/CHEST dated 10/09/2013; CT ABD/PELV WO CM dated 07/24/2013  FINDINGS: There are bilateral small pleural effusions, right greater than left with bibasilar atelectasis. There is stable cardiomegaly.  The liver demonstrates no focal abnormality. There is no intrahepatic or extrahepatic biliary ductal dilatation. The gallbladder is surgically absent. The spleen demonstrates no focal abnormality.There is a horseshoe kidney with left nephrolithiasis. There is no obstructive uropathy. The adrenal glands and pancreas are normal. The bladder is unremarkable.  There is gaseous distension of the colon with multiple colonic air-fluid levels. There is no significant small bowel dilatation. There are multiple small bowel air-fluid levels. There is no pneumoperitoneum, pneumatosis, or portal venous gas. There is no abdominal or pelvic free fluid. There is no lymphadenopathy.  The abdominal aorta is normal in caliber with atherosclerosis.  There are no lytic or sclerotic osseous lesions. There is mild lumbar spine spondylosis.  IMPRESSION: 1. Multiple small bowel and colonic air-fluid levels with nonspecific gaseous distention of the colon. This appearance can be seen with an ileus versus better colitis.  2.  Small bilateral pleural effusions and bibasilar atelectasis.  3.  Horseshoe kidney.  Left nephrolithiasis.   Electronically Signed   By: Kathreen Devoid   On: 10/09/2013 23:57   Dg Abd Acute W/chest  10/09/2013   CLINICAL DATA:  Abdominal pain, emesis and weakness.  EXAM: ACUTE ABDOMEN SERIES (ABDOMEN 2 VIEW & CHEST 1 VIEW)  COMPARISON:  DG ABD PORTABLE 1V dated 07/27/2013; DG ABD  ACUTE W/CHEST dated 07/24/2013  FINDINGS: Cardiac silhouette is moderately enlarged, mediastinal silhouette is tissues. Mild central pulmonary vasculature congestion interstitial prominence with patchy left lower lobe airspace opacity. No pleural effusions. No pneumothorax. Severely calcified thoracic aorta. Multiple EKG lines overlie the patient and may obscure subtle underlying pathology.  Diffuse colonic gaseous distention, sigmoid is 13 cm in transaxial dimension, previously 10 cm.  Surgical clips in the right upper quadrant. No intra-abdominal mass effect. Phleboliths in the pelvis. Status post left hip hemiarthroplasty.  IMPRESSION: Cardiomegaly, central pulmonary vasculature congestion with patchy left lower lobe airspace opacity which may reflect atelectasis or even early pneumonia.  Worsening gaseous distention of the colon, though this may reflect ileus, distal large bowel obstruction may have a similar appearance.   Electronically Signed   By: Elon Alas   On: 10/09/2013 20:27      Assessment & Plan  80 year old female with a past medical history of sigmoid volvulus, A. fib, diabetes, dementia, diastolic heart failure admitted with acute on chronic kidney injury, metabolic arrangement, and ileus  1.  Ileus -- she does have a history of sigmoid volvulus requiring reduction in December 2014 by flexible sigmoidoscopy, there is no evidence for recurrent volvulus. NG tube removed last night with no further vomiting. Bowel dilatation is felt likely in part chronic due to debility, but probably worsened given metabolic derangements.  No evidence for GI bleeding --Most important issue is to correct hypernatremia and hypokalemia --Avoid narcotics when possible as this will slow gut motility --If loose stools return, and check GI pathogen panel, but my suspicion for infectious enteritis/colitis is low --Recommend turning patient in bed throughout the day and out of bed if possible to help gut  motility  2.  Electrolyte derangement -- hypernatremia has worsened since admission indicating free water deficit. Free water replacement per primary team. Given your aggressive potassium replacement with goal K > 4  3.  AKI -- improving    Principal Problem:   Bowel obstruction Active Problems:   Atrial fibrillation with controlled ventricular response   Diastolic CHF, chronic   Hyperlipemia   Diabetes mellitus   Dementia   Dehydration   Acute renal failure   Anemia   Malnutrition of moderate degree     LOS: 2 days   Lajuan Lines Kaseem Vastine  10/11/2013, 8:03 AM

## 2013-10-12 DIAGNOSIS — N179 Acute kidney failure, unspecified: Secondary | ICD-10-CM | POA: Diagnosis not present

## 2013-10-12 DIAGNOSIS — D649 Anemia, unspecified: Secondary | ICD-10-CM

## 2013-10-12 DIAGNOSIS — E860 Dehydration: Secondary | ICD-10-CM | POA: Diagnosis not present

## 2013-10-12 DIAGNOSIS — E876 Hypokalemia: Secondary | ICD-10-CM | POA: Diagnosis not present

## 2013-10-12 DIAGNOSIS — E870 Hyperosmolality and hypernatremia: Secondary | ICD-10-CM | POA: Diagnosis not present

## 2013-10-12 LAB — CBC
HCT: 23.6 % — ABNORMAL LOW (ref 36.0–46.0)
Hemoglobin: 8 g/dL — ABNORMAL LOW (ref 12.0–15.0)
MCH: 33.3 pg (ref 26.0–34.0)
MCHC: 33.9 g/dL (ref 30.0–36.0)
MCV: 98.3 fL (ref 78.0–100.0)
Platelets: 155 10*3/uL (ref 150–400)
RBC: 2.4 MIL/uL — AB (ref 3.87–5.11)
RDW: 16.8 % — AB (ref 11.5–15.5)
WBC: 7.1 10*3/uL (ref 4.0–10.5)

## 2013-10-12 LAB — BASIC METABOLIC PANEL
BUN: 20 mg/dL (ref 6–23)
CO2: 13 mEq/L — ABNORMAL LOW (ref 19–32)
CREATININE: 1.2 mg/dL — AB (ref 0.50–1.10)
Calcium: 9.8 mg/dL (ref 8.4–10.5)
Chloride: 127 mEq/L — ABNORMAL HIGH (ref 96–112)
GFR calc Af Amer: 46 mL/min — ABNORMAL LOW (ref >90)
GFR, EST NON AFRICAN AMERICAN: 39 mL/min — AB (ref >90)
Glucose, Bld: 144 mg/dL — ABNORMAL HIGH (ref 70–99)
Potassium: 2.9 mEq/L — CL (ref 3.7–5.3)
SODIUM: 151 meq/L — AB (ref 137–147)

## 2013-10-12 MED ORDER — POTASSIUM CHLORIDE CRYS ER 20 MEQ PO TBCR
40.0000 meq | EXTENDED_RELEASE_TABLET | ORAL | Status: DC
  Administered 2013-10-12: 40 meq via ORAL
  Filled 2013-10-12 (×3): qty 2

## 2013-10-12 MED ORDER — POTASSIUM CHLORIDE 10 MEQ/100ML IV SOLN
10.0000 meq | INTRAVENOUS | Status: AC
  Administered 2013-10-12 (×5): 10 meq via INTRAVENOUS
  Filled 2013-10-12 (×5): qty 100

## 2013-10-12 MED ORDER — LEVOFLOXACIN IN D5W 250 MG/50ML IV SOLN
250.0000 mg | INTRAVENOUS | Status: DC
  Administered 2013-10-12 – 2013-10-14 (×3): 250 mg via INTRAVENOUS
  Filled 2013-10-12 (×4): qty 50

## 2013-10-12 MED ORDER — POTASSIUM CHLORIDE 10 MEQ/100ML IV SOLN
10.0000 meq | INTRAVENOUS | Status: AC
  Administered 2013-10-12 (×3): 10 meq via INTRAVENOUS
  Filled 2013-10-12 (×3): qty 100

## 2013-10-12 MED ORDER — LEVOFLOXACIN 250 MG PO TABS
250.0000 mg | ORAL_TABLET | Freq: Every day | ORAL | Status: DC
  Filled 2013-10-12: qty 1

## 2013-10-12 NOTE — Progress Notes (Signed)
    Progress Note   Subjective  Patient reports "trouble with my stomach" Had episode of vomiting after trying to eat lunch, emesis nonbloody No bowel movement since yesterday    Objective  Vital signs in last 24 hours: Temp:  [97.6 F (36.4 C)-98.9 F (37.2 C)] 98.7 F (37.1 C) (05/10 1500) Pulse Rate:  [92-99] 99 (05/10 1500) Resp:  [16-18] 18 (05/10 1500) BP: (132-152)/(59-89) 152/89 mmHg (05/10 1500) SpO2:  [97 %-99 %] 97 % (05/10 1500) Last BM Date: 10/10/13 Gen: Elderly-appearing female in no acute distress  HEENT: anicteric, op clear  CV: RRR  Pulm: CTA b/l anteriorly  Abd: soft, NT, increased distention today with increased tympany, hypoactive but present bowel sounds  Ext: no c/c/e  Neuro: nonfocal   Intake/Output from previous day: 05/09 0701 - 05/10 0700 In: 2156.3 [P.O.:540; I.V.:1566.3; IV Piggyback:50] Out: -  Intake/Output this shift: Total I/O In: 120 [P.O.:120] Out: -   Lab Results:  Recent Labs  10/10/13 0103 10/11/13 0503 10/12/13 0537  WBC 6.2 7.1 7.1  HGB 7.9* 8.2* 8.0*  HCT 23.3* 24.4* 23.6*  PLT 137* 157 155   BMET  Recent Labs  10/10/13 1806 10/11/13 0503 10/12/13 0537  NA 153* 152* 151*  K 3.2* 3.0* 2.9*  CL 127* 129* 127*  CO2 14* 12* 13*  GLUCOSE 137* 131* 144*  BUN 25* 24* 20  CREATININE 1.28* 1.19* 1.20*  CALCIUM 9.2 9.5 9.8   LFT  Recent Labs  10/09/13 1906  PROT 6.1  ALBUMIN 3.1*  AST 20  ALT 10  ALKPHOS 72  BILITOT 0.2*   PT/INR  Recent Labs  10/09/13 1906  LABPROT 16.1*  INR 1.32     Assessment & Plan  80 year old female with a past medical history of sigmoid volvulus, A. fib, diabetes, dementia, diastolic heart failure admitted with acute on chronic kidney injury, metabolic arrangement, and ileus  1.  Ileus/hx of sigmoid volvulus -- increased abdominal distention today, with recent vomiting.  CT recently did not show recurrent volvulus. I do think she likely has chronic colonic distention, but  likely increased recently. Certainly this is in part due to the ongoing metabolic derangements. No evidence for GI bleeding --Continue to work to correct hypernatremia and hypokalemia --Avoid narcotics --If vomiting happens again, replace NGT --Repeat abdominal x-ray in the morning  2.  Anemia -- stable  3.  AKI -- improving  4.  Electrolyte derangement -- per primary team, free water and potassium replacement, goal potassium greater than 4  Principal Problem:   Bowel obstruction Active Problems:   Atrial fibrillation with controlled ventricular response   Diastolic CHF, chronic   Hyperlipemia   Diabetes mellitus   Dementia   Dehydration   Acute renal failure   Anemia   Malnutrition of moderate degree     LOS: 3 days   [name]  10/12/2013, 4:37 PM

## 2013-10-12 NOTE — Progress Notes (Signed)
CRITICAL VALUE ALERT  Critical value received:  Potassium 2.9  Date of notification:  10/12/13  Time of notification:  0623  Critical value read back:yes  Nurse who received alert:  Dellie Catholic  MD notified (1st page):  Yes  Time of first page: [phone]

## 2013-10-12 NOTE — Progress Notes (Addendum)
RN attempted to call daughter @ [phone]. ( Returning a call placed to RN). Line was busy,unable to leave a voicemail message.

## 2013-10-12 NOTE — Progress Notes (Signed)
[facility] PROGRESS NOTE  Maria Christian ZOX:[id number] DOB: 06/03/1925 DOA: 10/09/2013 PCP: Ezequiel Kayser, MD  Assessment/Plan: Ileus vs SBO -Patient has had several BMs since admission. -No n/v. -Tube is out and she is tolerating clears. -Will continue to advance diet as tolerated. -Correct electrolyte abnormalities.  Hypokalemia -Continue to r eplete PO. -Mg ok at 1.7.  Hypernatremia -Likely related to free water deficit from decreased PO intake. -Slow improvement. -Continue D5 infusion.  ARF -Cr continues to improve. -Likely 2/2 prerenal azotemia and dehydration. -Recheck BMET in am.  Anemia - No indication for acute transfusion.  Chronic Diastolic CHF -Compensated at present.  Code Status: Full Code Family Communication: Will contact son later today via telephone. Disposition Plan: Back to SNF when ready; likely 24-48 hours.   Consultants:  GI    Antibiotics:  None   Subjective: Answers simple questions.  Objective: Filed Vitals:   10/11/13 0551 10/11/13 1500 10/11/13 2127 10/12/13 0500  BP: 135/88 151/90 132/84 136/59  Pulse: 99 105 96 92  Temp: 98.7 F (37.1 C) 97.5 F (36.4 C) 97.6 F (36.4 C) 98.9 F (37.2 C)  TempSrc: Oral Oral Oral Oral  Resp: 18 18 18 16   Height:      Weight: 71.1 kg (156 lb 12 oz)     SpO2: 99% 99% 99% 97%    Intake/Output Summary (Last 24 hours) at 10/12/13 1453 Last data filed at 10/12/13 0800  Gross per 24 hour  Intake 2276.25 ml  Output      0 ml  Net 2276.25 ml   Filed Weights   10/10/13 0110 10/11/13 0313 10/11/13 0551  Weight: 70 kg (154 lb 5.2 oz) 71.2 kg (156 lb 15.5 oz) 71.1 kg (156 lb 12 oz)    Exam:   General:  Awake, calm  Cardiovascular: RRR  Respiratory: CTA B  Abdomen: Soft, non-distended  Extremities: no C/C/E   Neurologic:  Non-focal  Data Reviewed: Basic Metabolic Panel:  Recent Labs Lab 10/09/13 1906 10/10/13 0100 10/10/13 0103 10/10/13 1806  10/11/13 0503 10/12/13 0537  NA 149*  --  149* 153* 152* 151*  K 2.9*  --  2.7* 3.2* 3.0* 2.9*  CL 123*  --  122* 127* 129* 127*  CO2 14*  --  15* 14* 12* 13*  GLUCOSE 145*  --  157* 137* 131* 144*  BUN 32*  --  30* 25* 24* 20  CREATININE 1.47*  --  1.31* 1.28* 1.19* 1.20*  CALCIUM 9.5  --  9.4 9.2 9.5 9.8  MG  --  1.7  --   --   --   --    Liver Function Tests:  Recent Labs Lab 10/09/13 1906  AST 20  ALT 10  ALKPHOS 72  BILITOT 0.2*  PROT 6.1  ALBUMIN 3.1*    Recent Labs Lab 10/09/13 1906  LIPASE 19   No results found for this basename: AMMONIA,  in the last 168 hours CBC:  Recent Labs Lab 10/09/13 1906 10/10/13 0103 10/11/13 0503 10/12/13 0537  WBC 5.8 6.2 7.1 7.1  NEUTROABS 4.2  --   --   --   HGB 7.8* 7.9* 8.2* 8.0*  HCT 23.3* 23.3* 24.4* 23.6*  MCV 99.6 100.0 100.0 98.3  PLT 133* 137* 157 155   Cardiac Enzymes: No results found for this basename: CKTOTAL, CKMB, CKMBINDEX, TROPONINI,  in the last 168 hours BNP (last 3 results)  Recent Labs  05/24/13 0250  PROBNP 3915.0*   CBG: No results found  for this basename: GLUCAP,  in the last 168 hours  Recent Results (from the past 240 hour(s))  MRSA PCR SCREENING     Status: None   Collection Time    10/10/13  3:04 AM      Result Value Ref Range Status   MRSA by PCR NEGATIVE  NEGATIVE Final   Comment:            The GeneXpert MRSA Assay (FDA     approved for NASAL specimens     only), is one component of a     comprehensive MRSA colonization     surveillance program. It is not     intended to diagnose MRSA     infection nor to guide or     monitor treatment for     MRSA infections.     Studies: No results found.  Scheduled Meds: . dorzolamide  1 drop Both Eyes TID  . heparin subcutaneous  5,000 Units Subcutaneous Q12H  . latanoprost  1 drop Both Eyes QHS  . levofloxacin (LEVAQUIN) IV  250 mg Intravenous Q24H  . potassium chloride  40 mEq Oral Q4H  . sodium chloride  3 mL Intravenous  Q12H   Continuous Infusions: . dextrose 75 mL/hr at 10/12/13 0429    Principal Problem:   Bowel obstruction Active Problems:   Atrial fibrillation with controlled ventricular response   Diastolic CHF, chronic   Hyperlipemia   Diabetes mellitus   Dementia   Dehydration   Acute renal failure   Anemia   Malnutrition of moderate degree    Time spent: 25 minutes. Greater than 50% of this time was spent in direct contact with the patient coordinating care.    [place] [facility] Pager [phone]  If 7PM-7AM, please contact night-coverage at [email or web address], password [facility] 10/12/2013, 2:53 PM  LOS: 3 days

## 2013-10-12 NOTE — Progress Notes (Signed)
RN attempted to call daughter again. Phone line still had a busy signal. RN was not able to talk with daughter at this time.

## 2013-10-12 NOTE — Progress Notes (Signed)
Patient removed PIV. 2 x RNs attempted to restart PIV but were unsuccessful.  IV team was notified to assess patient for new PIV site.

## 2013-10-13 ENCOUNTER — Inpatient Hospital Stay (HOSPITAL_COMMUNITY): Payer: Medicare Other

## 2013-10-13 DIAGNOSIS — N179 Acute kidney failure, unspecified: Secondary | ICD-10-CM | POA: Diagnosis not present

## 2013-10-13 DIAGNOSIS — K560 Paralytic ileus: Secondary | ICD-10-CM | POA: Diagnosis not present

## 2013-10-13 DIAGNOSIS — R109 Unspecified abdominal pain: Secondary | ICD-10-CM | POA: Diagnosis not present

## 2013-10-13 DIAGNOSIS — R0989 Other specified symptoms and signs involving the circulatory and respiratory systems: Secondary | ICD-10-CM | POA: Diagnosis not present

## 2013-10-13 DIAGNOSIS — E860 Dehydration: Secondary | ICD-10-CM | POA: Diagnosis not present

## 2013-10-13 DIAGNOSIS — R141 Gas pain: Secondary | ICD-10-CM | POA: Diagnosis not present

## 2013-10-13 LAB — BASIC METABOLIC PANEL
BUN: 14 mg/dL (ref 6–23)
CALCIUM: 9.5 mg/dL (ref 8.4–10.5)
CO2: 13 meq/L — AB (ref 19–32)
CREATININE: 1.1 mg/dL (ref 0.50–1.10)
Chloride: 122 mEq/L — ABNORMAL HIGH (ref 96–112)
GFR, EST AFRICAN AMERICAN: 51 mL/min — AB (ref >90)
GFR, EST NON AFRICAN AMERICAN: 44 mL/min — AB (ref >90)
Glucose, Bld: 150 mg/dL — ABNORMAL HIGH (ref 70–99)
Potassium: 2.9 mEq/L — CL (ref 3.7–5.3)
SODIUM: 147 meq/L (ref 137–147)

## 2013-10-13 MED ORDER — POTASSIUM CHLORIDE CRYS ER 20 MEQ PO TBCR
40.0000 meq | EXTENDED_RELEASE_TABLET | Freq: Once | ORAL | Status: DC
  Filled 2013-10-13: qty 2

## 2013-10-13 MED ORDER — POTASSIUM CHLORIDE 10 MEQ/100ML IV SOLN
10.0000 meq | INTRAVENOUS | Status: AC
  Administered 2013-10-13 (×3): 10 meq via INTRAVENOUS
  Filled 2013-10-13 (×4): qty 100

## 2013-10-13 MED ORDER — POTASSIUM CHLORIDE 10 MEQ/100ML IV SOLN
10.0000 meq | INTRAVENOUS | Status: AC
  Administered 2013-10-13 (×5): 10 meq via INTRAVENOUS
  Filled 2013-10-13 (×7): qty 100

## 2013-10-13 MED ORDER — BISACODYL 10 MG RE SUPP
10.0000 mg | Freq: Two times a day (BID) | RECTAL | Status: DC
  Administered 2013-10-13 – 2013-10-16 (×7): 10 mg via RECTAL
  Filled 2013-10-13 (×8): qty 1

## 2013-10-13 NOTE — Progress Notes (Signed)
After the patient was repositioned, and the X Rays were taken, the patient seemed to be slightly more comfortable. PCP called RN to check on the patient's condition.  At this time the NG tube will not be replaced.

## 2013-10-13 NOTE — Progress Notes (Signed)
There was an order to do a 2 view Abdomen Xray. Patient already had this X ray this am. PCP was notified. Awaiting new orders.

## 2013-10-13 NOTE — Progress Notes (Signed)
CRITICAL VALUE ALERT  Critical value received:  Potassium 2.9  Date of notification:  10/13/13   Time of notification:  0507  Critical value read back:yes  Nurse who received alert:  Dellie Catholic  MD notified (1st page):  Yes   Time of first page: [phone]

## 2013-10-13 NOTE — Progress Notes (Addendum)
Patient cried out that her abdomen was very sore. Abdomen is very distended and taut. Patient was repositioned   PCP was notified. New orders were given.

## 2013-10-13 NOTE — Progress Notes (Signed)
[facility] PROGRESS NOTE  Maria Christian MVH:[id number] DOB: 1924/06/06 DOA: 10/09/2013 PCP: Ezequiel Kayser, MD  Assessment/Plan: Ileus  -Patient has had several BMs since admission. -No n/v. -Tube is out and she is tolerating clears. -Will continue to advance diet as tolerated. -Correct electrolyte abnormalities. -X rays show persistent gaseous distention  Hypokalemia -Continue to replete PO and IV. -Mg ok at 1.7.  Hypernatremia -Likely related to free water deficit from decreased PO intake. -Improving. -Continue D5 infusion today.  ARF -RESOLVED. -Likely 2/2 prerenal azotemia and dehydration.  Anemia - No indication for acute transfusion.  Chronic Diastolic CHF -Compensated at present.  Code Status: Full Code Family Communication: Patient only. Disposition Plan: Back to SNF when ready; likely 24-48 hours.   Consultants:  GI    Antibiotics:  None   Subjective: Answers simple questions.  Objective: Filed Vitals:   10/12/13 2144 10/13/13 0330 10/13/13 0557 10/13/13 1500  BP: 155/88 153/80 146/76 136/72  Pulse: 95 115 105 103  Temp: 98.2 F (36.8 C) 98.3 F (36.8 C) 98.2 F (36.8 C) 98.2 F (36.8 C)  TempSrc: Oral Oral Oral Oral  Resp: 18 16 18 18   Height:      Weight:   74.1 kg (163 lb 5.8 oz)   SpO2: 97% 100% 100% 100%    Intake/Output Summary (Last 24 hours) at 10/13/13 1746 Last data filed at 10/13/13 0930  Gross per 24 hour  Intake 1077.25 ml  Output      0 ml  Net 1077.25 ml   Filed Weights   10/11/13 0313 10/11/13 0551 10/13/13 0557  Weight: 71.2 kg (156 lb 15.5 oz) 71.1 kg (156 lb 12 oz) 74.1 kg (163 lb 5.8 oz)    Exam:   General:  Awake, calm  Cardiovascular: RRR  Respiratory: CTA B  Abdomen: Soft, non-distended  Extremities: no C/C/E   Neurologic:  Non-focal  Data Reviewed: Basic Metabolic Panel:  Recent Labs Lab 10/10/13 0100 10/10/13 0103 10/10/13 1806 10/11/13 0503 10/12/13 0537  10/13/13 0413  NA  --  149* 153* 152* 151* 147  K  --  2.7* 3.2* 3.0* 2.9* 2.9*  CL  --  122* 127* 129* 127* 122*  CO2  --  15* 14* 12* 13* 13*  GLUCOSE  --  157* 137* 131* 144* 150*  BUN  --  30* 25* 24* 20 14  CREATININE  --  1.31* 1.28* 1.19* 1.20* 1.10  CALCIUM  --  9.4 9.2 9.5 9.8 9.5  MG 1.7  --   --   --   --   --    Liver Function Tests:  Recent Labs Lab 10/09/13 1906  AST 20  ALT 10  ALKPHOS 72  BILITOT 0.2*  PROT 6.1  ALBUMIN 3.1*    Recent Labs Lab 10/09/13 1906  LIPASE 19   No results found for this basename: AMMONIA,  in the last 168 hours CBC:  Recent Labs Lab 10/09/13 1906 10/10/13 0103 10/11/13 0503 10/12/13 0537  WBC 5.8 6.2 7.1 7.1  NEUTROABS 4.2  --   --   --   HGB 7.8* 7.9* 8.2* 8.0*  HCT 23.3* 23.3* 24.4* 23.6*  MCV 99.6 100.0 100.0 98.3  PLT 133* 137* 157 155   Cardiac Enzymes: No results found for this basename: CKTOTAL, CKMB, CKMBINDEX, TROPONINI,  in the last 168 hours BNP (last 3 results)  Recent Labs  05/24/13 0250  PROBNP 3915.0*   CBG: No results found for this basename: GLUCAP,  in  the last 168 hours  Recent Results (from the past 240 hour(s))  MRSA PCR SCREENING     Status: None   Collection Time    10/10/13  3:04 AM      Result Value Ref Range Status   MRSA by PCR NEGATIVE  NEGATIVE Final   Comment:            The GeneXpert MRSA Assay (FDA     approved for NASAL specimens     only), is one component of a     comprehensive MRSA colonization     surveillance program. It is not     intended to diagnose MRSA     infection nor to guide or     monitor treatment for     MRSA infections.     Studies: Dg Chest Port 1 View  10/13/2013   CLINICAL DATA:  Abdominal distention and pain.  EXAM: PORTABLE CHEST - 1 VIEW  COMPARISON:  DG ABD ACUTE W/CHEST dated 10/09/2013  FINDINGS: Cardiac enlargement with mild pulmonary vascular congestion. No edema or consolidation. No blunting of costophrenic angles. No pneumothorax.   IMPRESSION: Cardiac enlargement with mild pulmonary vascular congestion.   Electronically Signed   By: Lucienne Capers M.D.   On: 10/13/2013 04:10   Dg Abd Portable 2v  10/13/2013   CLINICAL DATA:  Abdominal distention and pain  EXAM: PORTABLE ABDOMEN - 2 VIEW  COMPARISON:  CT ABD/PELVIS W CM dated 10/09/2013; DG ABD ACUTE W/CHEST dated 10/09/2013  FINDINGS: Moderate diffuse gaseous distention of the colon. No definite small bowel distention. Appearance is similar to previous study and likely represents adynamic ileus. No free air is appreciated. Surgical clips in the right upper quadrant. Postoperative left hip hemiarthroplasty.  IMPRESSION: Persisting gaseous distention of the colon most likely representing adynamic ileus.   Electronically Signed   By: Lucienne Capers M.D.   On: 10/13/2013 04:09    Scheduled Meds: . bisacodyl  10 mg Rectal BID  . dorzolamide  1 drop Both Eyes TID  . heparin subcutaneous  5,000 Units Subcutaneous Q12H  . latanoprost  1 drop Both Eyes QHS  . levofloxacin (LEVAQUIN) IV  250 mg Intravenous Q24H  . potassium chloride  40 mEq Oral Once  . sodium chloride  3 mL Intravenous Q12H   Continuous Infusions: . dextrose 75 mL/hr at 10/13/13 0755    Principal Problem:   Bowel obstruction Active Problems:   Atrial fibrillation with controlled ventricular response   Diastolic CHF, chronic   Hyperlipemia   Diabetes mellitus   Dementia   Dehydration   Acute renal failure   Anemia   Malnutrition of moderate degree    Time spent: 25 minutes. Greater than 50% of this time was spent in direct contact with the patient coordinating care.    [facility] Pager [phone]  If 7PM-7AM, please contact night-coverage at [email or web address], password [facility] 10/13/2013, 5:46 PM  LOS: 4 days

## 2013-10-13 NOTE — Progress Notes (Signed)
Progress Note   Subjective  No vomiting since yesterday. Asking for GingerAle. No significant BMs   Objective   Vital signs in last 24 hours: Temp:  [98.2 F (36.8 C)-98.7 F (37.1 C)] 98.2 F (36.8 C) (05/11 0557) Pulse Rate:  [95-115] 105 (05/11 0557) Resp:  [16-18] 18 (05/11 0557) BP: (146-155)/(76-89) 146/76 mmHg (05/11 0557) SpO2:  [97 %-100 %] 100 % (05/11 0557) Weight:  [163 lb 5.8 oz (74.1 kg)] 163 lb 5.8 oz (74.1 kg) (05/11 0557) Last BM Date: 10/12/13 General:    Pleasant white female in NAD. Extremely HOH Heart:  Regular rate , occas irregular beat. Abdomen:  Soft, moderately-largely distended with abnormal bowel sounds.  Extremities:  Without edema. Neurologic:  Alert and oriented,  grossly normal neurologically. Psych:  Cooperative. Normal mood and affect.    Lab Results:  Recent Labs  10/11/13 0503 10/12/13 0537  WBC 7.1 7.1  HGB 8.2* 8.0*  HCT 24.4* 23.6*  PLT 157 155   BMET  Recent Labs  10/11/13 0503 10/12/13 0537 10/13/13 0413  NA 152* 151* 147  K 3.0* 2.9* 2.9*  CL 129* 127* 122*  CO2 12* 13* 13*  GLUCOSE 131* 144* 150*  BUN 24* 20 14  CREATININE 1.19* 1.20* 1.10  CALCIUM 9.5 9.8 9.5   Studies/Results: Dg Chest Port 1 View  10/13/2013   CLINICAL DATA:  Abdominal distention and pain.  EXAM: PORTABLE CHEST - 1 VIEW  COMPARISON:  DG ABD ACUTE W/CHEST dated 10/09/2013  FINDINGS: Cardiac enlargement with mild pulmonary vascular congestion. No edema or consolidation. No blunting of costophrenic angles. No pneumothorax.  IMPRESSION: Cardiac enlargement with mild pulmonary vascular congestion.   Electronically Signed   By: Lucienne Capers M.D.   On: 10/13/2013 04:10   Dg Abd Portable 2v  10/13/2013   CLINICAL DATA:  Abdominal distention and pain  EXAM: PORTABLE ABDOMEN - 2 VIEW  COMPARISON:  CT ABD/PELVIS W CM dated 10/09/2013; DG ABD ACUTE W/CHEST dated 10/09/2013  FINDINGS: Moderate diffuse gaseous distention of the colon. No definite small bowel  distention. Appearance is similar to previous study and likely represents adynamic ileus. No free air is appreciated. Surgical clips in the right upper quadrant. Postoperative left hip hemiarthroplasty.  IMPRESSION: Persisting gaseous distention of the colon most likely representing adynamic ileus.   Electronically Signed   By: Lucienne Capers M.D.   On: 10/13/2013 04:09     Assessment / Plan:   1. Colonic distention, likely pseudoobstruction, no evidence for recurrent volvulus on xrays. She probably has some degree of chronic pseudoobstruction, now with exacerbation. Electrolyte abnormalities likely contributing to progressive dysmotility.  Sodium now WNL but K+ still low. Getting IV K+ runs. Recommend getting and keeping potassium above 4.0. Try and avoid laying flat of back in bed, rotate side lying positions to mobilize bowel gas. Will start Dulcolax suppositories to stimulate from below. She may eventually need rectal tube placement for decompression. No nausea and no small bowel distention on films so will hold off on NGT for now.   2. Persistent hypokalemia. Mg+ level 5/8 was low normal at 1.7. She is getting four runs of IV potassium today  3. Hypernatremia, resolved.     LOS: 4 days   Willia Craze  10/13/2013, 8:26 AM   ________________________________________________________________________  Velora Heckler GI MD note:  I personally examined the patient, reviewed the data and agree with the assessment and plan described above.  She tells me she is not walking.  Ambulation  in halls at least TID with assist can help resolve this ileus, pseudoobstruction. Agree with suppositories, correcting K. Will continue to follow.   Owens Loffler, MD [facility] Pager [phone]

## 2013-10-14 ENCOUNTER — Inpatient Hospital Stay (HOSPITAL_COMMUNITY): Payer: Medicare Other

## 2013-10-14 DIAGNOSIS — R141 Gas pain: Secondary | ICD-10-CM | POA: Diagnosis not present

## 2013-10-14 DIAGNOSIS — R109 Unspecified abdominal pain: Secondary | ICD-10-CM | POA: Diagnosis not present

## 2013-10-14 DIAGNOSIS — E860 Dehydration: Secondary | ICD-10-CM | POA: Diagnosis not present

## 2013-10-14 DIAGNOSIS — N179 Acute kidney failure, unspecified: Secondary | ICD-10-CM | POA: Diagnosis not present

## 2013-10-14 LAB — CBC
HCT: 21.7 % — ABNORMAL LOW (ref 36.0–46.0)
Hemoglobin: 7.4 g/dL — ABNORMAL LOW (ref 12.0–15.0)
MCH: 33.2 pg (ref 26.0–34.0)
MCHC: 34.1 g/dL (ref 30.0–36.0)
MCV: 97.3 fL (ref 78.0–100.0)
PLATELETS: 157 10*3/uL (ref 150–400)
RBC: 2.23 MIL/uL — AB (ref 3.87–5.11)
RDW: 17 % — ABNORMAL HIGH (ref 11.5–15.5)
WBC: 5.4 10*3/uL (ref 4.0–10.5)

## 2013-10-14 LAB — BASIC METABOLIC PANEL
BUN: 9 mg/dL (ref 6–23)
CALCIUM: 9.4 mg/dL (ref 8.4–10.5)
CO2: 15 mEq/L — ABNORMAL LOW (ref 19–32)
Chloride: 119 mEq/L — ABNORMAL HIGH (ref 96–112)
Creatinine, Ser: 1.05 mg/dL (ref 0.50–1.10)
GFR, EST AFRICAN AMERICAN: 54 mL/min — AB (ref >90)
GFR, EST NON AFRICAN AMERICAN: 46 mL/min — AB (ref >90)
GLUCOSE: 143 mg/dL — AB (ref 70–99)
Potassium: 2.9 mEq/L — CL (ref 3.7–5.3)
SODIUM: 144 meq/L (ref 137–147)

## 2013-10-14 MED ORDER — KCL IN DEXTROSE-NACL 20-5-0.9 MEQ/L-%-% IV SOLN
INTRAVENOUS | Status: DC
  Administered 2013-10-14 – 2013-10-16 (×3): via INTRAVENOUS
  Filled 2013-10-14 (×5): qty 1000

## 2013-10-14 MED ORDER — POTASSIUM CHLORIDE CRYS ER 20 MEQ PO TBCR
40.0000 meq | EXTENDED_RELEASE_TABLET | ORAL | Status: AC
  Administered 2013-10-14 (×2): 40 meq via ORAL
  Filled 2013-10-14 (×2): qty 2

## 2013-10-14 MED ORDER — POTASSIUM CHLORIDE 10 MEQ/100ML IV SOLN
10.0000 meq | INTRAVENOUS | Status: AC
  Administered 2013-10-14 (×5): 10 meq via INTRAVENOUS
  Filled 2013-10-14 (×5): qty 100

## 2013-10-14 NOTE — Progress Notes (Signed)
[facility] PROGRESS NOTE  HARSHITA BERNALES WUJ:[id number] DOB: [birth year] DOA: 10/09/2013 PCP: Ezequiel Kayser, MD  Assessment/Plan: Ileus  -Patient has had several BMs since admission. -No n/v. -Tube is out and she is tolerating clears. -Will continue to advance diet as tolerated. -Correct electrolyte abnormalities. -X rays show persistent gaseous distention  Hypokalemia -Continue to replete PO and IV. -Mg ok at 1.7. -Has received over 200 meq of K since admission and K remains low.  Hypernatremia -Likely related to free water deficit from decreased PO intake. -Resolved. -Continue D5 infusion today.  ARF -RESOLVED. -Likely 2/2 prerenal azotemia and dehydration.  Anemia - No indication for acute transfusion.  Chronic Diastolic CHF -Compensated at present.  Code Status: Full Code Family Communication: Discussed with son [name] via telephone. Disposition Plan: Back to SNF when ready.   Consultants:  GI    Antibiotics:  None   Subjective: Answers simple questions.  Objective: Filed Vitals:   10/13/13 0557 10/13/13 1500 10/13/13 2000 10/14/13 0439  BP: 146/76 136/72 130/70 135/76  Pulse: 105 103 86 99  Temp: 98.2 F (36.8 C) 98.2 F (36.8 C) 98.3 F (36.8 C) 98.2 F (36.8 C)  TempSrc: Oral Oral Oral Oral  Resp: 18 18 18 18   Height:      Weight: 74.1 kg (163 lb 5.8 oz)   73.9 kg (162 lb 14.7 oz)  SpO2: 100% 100% 100% 100%    Intake/Output Summary (Last 24 hours) at 10/14/13 1443 Last data filed at 10/14/13 0954  Gross per 24 hour  Intake   3670 ml  Output      0 ml  Net   3670 ml   Filed Weights   10/11/13 0551 10/13/13 0557 10/14/13 0439  Weight: 71.1 kg (156 lb 12 oz) 74.1 kg (163 lb 5.8 oz) 73.9 kg (162 lb 14.7 oz)    Exam:   General:  Awake, calm  Cardiovascular: RRR  Respiratory: CTA B  Abdomen: Soft, non-distended  Extremities: no C/C/E   Neurologic:  Non-focal  Data Reviewed: Basic Metabolic Panel:  Recent  Labs Lab 10/10/13 0100  10/10/13 1806 10/11/13 0503 10/12/13 0537 10/13/13 0413 10/14/13 0445  NA  --   < > 153* 152* 151* 147 144  K  --   < > 3.2* 3.0* 2.9* 2.9* 2.9*  CL  --   < > 127* 129* 127* 122* 119*  CO2  --   < > 14* 12* 13* 13* 15*  GLUCOSE  --   < > 137* 131* 144* 150* 143*  BUN  --   < > 25* 24* 20 14 9   CREATININE  --   < > 1.28* 1.19* 1.20* 1.10 1.05  CALCIUM  --   < > 9.2 9.5 9.8 9.5 9.4  MG 1.7  --   --   --   --   --   --   < > = values in this interval not displayed. Liver Function Tests:  Recent Labs Lab 10/09/13 1906  AST 20  ALT 10  ALKPHOS 72  BILITOT 0.2*  PROT 6.1  ALBUMIN 3.1*    Recent Labs Lab 10/09/13 1906  LIPASE 19   No results found for this basename: AMMONIA,  in the last 168 hours CBC:  Recent Labs Lab 10/09/13 1906 10/10/13 0103 10/11/13 0503 10/12/13 0537 10/14/13 0445  WBC 5.8 6.2 7.1 7.1 5.4  NEUTROABS 4.2  --   --   --   --   HGB 7.8* 7.9*  8.2* 8.0* 7.4*  HCT 23.3* 23.3* 24.4* 23.6* 21.7*  MCV 99.6 100.0 100.0 98.3 97.3  PLT 133* 137* 157 155 157   Cardiac Enzymes: No results found for this basename: CKTOTAL, CKMB, CKMBINDEX, TROPONINI,  in the last 168 hours BNP (last 3 results)  Recent Labs  05/24/13 0250  PROBNP 3915.0*   CBG: No results found for this basename: GLUCAP,  in the last 168 hours  Recent Results (from the past 240 hour(s))  MRSA PCR SCREENING     Status: None   Collection Time    10/10/13  3:04 AM      Result Value Ref Range Status   MRSA by PCR NEGATIVE  NEGATIVE Final   Comment:            The GeneXpert MRSA Assay (FDA     approved for NASAL specimens     only), is one component of a     comprehensive MRSA colonization     surveillance program. It is not     intended to diagnose MRSA     infection nor to guide or     monitor treatment for     MRSA infections.     Studies: Dg Abd 1 View  10/14/2013   CLINICAL DATA:  Distension.  Follow-up pseudo obstruction.  EXAM: ABDOMEN - 1  VIEW  COMPARISON:  10/13/2013.  FINDINGS: Gas distended colon with superior displacement of the sigmoid colon. Sigmoid colon measures up to 11.6 cm versus prior 13.5 cm.  The possibility of free intraperitoneal air cannot be assessed on a supine view.  Calcified tortuous aorta.  Post left hip replacement.  IMPRESSION: Gas distended colon with superior displacement of the sigmoid colon. Sigmoid colon measures up to 11.6 cm versus prior 13.5 cm.   Electronically Signed   By: Chauncey Cruel M.D.   On: 10/14/2013 10:39   Dg Chest Port 1 View  10/13/2013   CLINICAL DATA:  Abdominal distention and pain.  EXAM: PORTABLE CHEST - 1 VIEW  COMPARISON:  DG ABD ACUTE W/CHEST dated 10/09/2013  FINDINGS: Cardiac enlargement with mild pulmonary vascular congestion. No edema or consolidation. No blunting of costophrenic angles. No pneumothorax.  IMPRESSION: Cardiac enlargement with mild pulmonary vascular congestion.   Electronically Signed   By: Lucienne Capers M.D.   On: 10/13/2013 04:10   Dg Abd Portable 2v  10/13/2013   CLINICAL DATA:  Abdominal distention and pain  EXAM: PORTABLE ABDOMEN - 2 VIEW  COMPARISON:  CT ABD/PELVIS W CM dated 10/09/2013; DG ABD ACUTE W/CHEST dated 10/09/2013  FINDINGS: Moderate diffuse gaseous distention of the colon. No definite small bowel distention. Appearance is similar to previous study and likely represents adynamic ileus. No free air is appreciated. Surgical clips in the right upper quadrant. Postoperative left hip hemiarthroplasty.  IMPRESSION: Persisting gaseous distention of the colon most likely representing adynamic ileus.   Electronically Signed   By: Lucienne Capers M.D.   On: 10/13/2013 04:09    Scheduled Meds: . bisacodyl  10 mg Rectal BID  . dorzolamide  1 drop Both Eyes TID  . heparin subcutaneous  5,000 Units Subcutaneous Q12H  . latanoprost  1 drop Both Eyes QHS  . levofloxacin (LEVAQUIN) IV  250 mg Intravenous Q24H  . potassium chloride  40 mEq Oral Once  . potassium  chloride  40 mEq Oral Q4H  . sodium chloride  3 mL Intravenous Q12H   Continuous Infusions: . dextrose 5 % and 0.9 % NaCl with KCl  20 mEq/L 75 mL/hr at 10/14/13 1107    Principal Problem:   Bowel obstruction Active Problems:   Atrial fibrillation with controlled ventricular response   Diastolic CHF, chronic   Hyperlipemia   Diabetes mellitus   Dementia   Dehydration   Acute renal failure   Anemia   Malnutrition of moderate degree    Time spent: 25 minutes. Greater than 50% of this time was spent in direct contact with the patient coordinating care.    [facility] Pager [phone]  If 7PM-7AM, please contact night-coverage at [email or web address], password [facility] 10/14/2013, 2:43 PM  LOS: 5 days

## 2013-10-14 NOTE — Progress Notes (Signed)
CRITICAL VALUE ALERT  Critical value received:  Potassium 2.9  Date of notification:  10/14/2013  Time of notification:  0525  Critical value read back:yes  Nurse who received alert:  L. Vergel de Dios RN  MD notified (1st page):  Lamar Blinks NP  Time of first page:  0600  MD notified (2nd page):  Time of second page:  Responding MD:  Lamar Blinks NP  Time MD responded:  [phone]

## 2013-10-14 NOTE — Progress Notes (Signed)
    Progress Note   Subjective  Try to eat breakfast but can't get spoon to mouth.     Objective   Vital signs in last 24 hours: Temp:  [98.2 F (36.8 C)-98.3 F (36.8 C)] 98.2 F (36.8 C) (05/12 0439) Pulse Rate:  [86-103] 99 (05/12 0439) Resp:  [18] 18 (05/12 0439) BP: (130-136)/(70-76) 135/76 mmHg (05/12 0439) SpO2:  [100 %] 100 % (05/12 0439) Weight:  [162 lb 14.7 oz (73.9 kg)] 162 lb 14.7 oz (73.9 kg) (05/12 0439) Last BM Date: 10/13/13 General:    white female in NAD Abdomen:  Soft, moderately distended with abnormal bowel sounds (metallic) and tympany.  Extremities:  Without edema. Neurologic:  Alert and oriented,  grossly normal neurologically. Psych:  Cooperative. Normal mood and affect.  Lab Results:  Recent Labs  10/12/13 0537 10/14/13 0445  WBC 7.1 5.4  HGB 8.0* 7.4*  HCT 23.6* 21.7*  PLT 155 157   BMET  Recent Labs  10/12/13 0537 10/13/13 0413 10/14/13 0445  NA 151* 147 144  K 2.9* 2.9* 2.9*  CL 127* 122* 119*  CO2 13* 13* 15*  GLUCOSE 144* 150* 143*  BUN 20 14 9   CREATININE 1.20* 1.10 1.05  CALCIUM 9.8 9.5 9.4      Assessment / Plan:   1. Pseudoobstruction, likely acute on chronic. Potassium remains low (2.9 today) despite several K+ runs.  Recommend getting and keeping potassium above 4.0. Try and avoid laying flat of back in bed, rotate side lying positions to mobilize bowel gas. Dulcolax supp started yesterday. Per RN, patient has been passing a lot of gas. Continue present course.   2. Persistent hypokalemia. Mg+ level 5/8 was low normal at 1.7. She is getting more IV potassium runs. Will add K+ to maintenance IVF.    LOS: 5 days   Maria Christian  10/14/2013, 9:00 AM   ________________________________________________________________________  Velora Heckler GI MD note:  I personally examined the patient, reviewed the data and agree with the assessment and plan described above. Really need to try to keep her electrolytes within normal  range.   Owens Loffler, MD [facility] Pager [phone]

## 2013-10-15 DIAGNOSIS — K560 Paralytic ileus: Secondary | ICD-10-CM | POA: Diagnosis not present

## 2013-10-15 DIAGNOSIS — E860 Dehydration: Secondary | ICD-10-CM | POA: Diagnosis not present

## 2013-10-15 DIAGNOSIS — R109 Unspecified abdominal pain: Secondary | ICD-10-CM | POA: Diagnosis not present

## 2013-10-15 LAB — BASIC METABOLIC PANEL
BUN: 6 mg/dL (ref 6–23)
CALCIUM: 9.2 mg/dL (ref 8.4–10.5)
CO2: 16 mEq/L — ABNORMAL LOW (ref 19–32)
Chloride: 122 mEq/L — ABNORMAL HIGH (ref 96–112)
Creatinine, Ser: 0.99 mg/dL (ref 0.50–1.10)
GFR calc Af Amer: 58 mL/min — ABNORMAL LOW (ref >90)
GFR calc non Af Amer: 50 mL/min — ABNORMAL LOW (ref >90)
Glucose, Bld: 136 mg/dL — ABNORMAL HIGH (ref 70–99)
Potassium: 3 mEq/L — ABNORMAL LOW (ref 3.7–5.3)
Sodium: 147 mEq/L (ref 137–147)

## 2013-10-15 LAB — MAGNESIUM: Magnesium: 1.5 mg/dL (ref 1.5–2.5)

## 2013-10-15 MED ORDER — POTASSIUM CHLORIDE CRYS ER 20 MEQ PO TBCR
40.0000 meq | EXTENDED_RELEASE_TABLET | ORAL | Status: AC
  Administered 2013-10-15 (×2): 40 meq via ORAL
  Filled 2013-10-15 (×2): qty 2

## 2013-10-15 NOTE — Progress Notes (Addendum)
[facility] PROGRESS NOTE  Maria Christian PFX:[id number] DOB: [birth year] DOA: 10/09/2013 PCP: Ezequiel Kayser, MD  Assessment/Plan: Ileus  -Patient has had several BMs since admission. -No n/v. -Tube is out and she is toelrating full liquids. Will advance to soft tonight.  -Will continue to advance diet as tolerated. -Correct electrolyte abnormalities. -X rays show persistent gaseous distention  Hypokalemia -Continue to replete PO and IV. -Mg ok at 1.7. -Has received over 200 meq of K since admission and K remains low.  Hypernatremia -Likely related to free water deficit from decreased PO intake. -Resolved. -Continue D5 infusion today.  ARF -RESOLVED. -Likely 2/2 prerenal azotemia and dehydration.  Anemia - No indication for acute transfusion.  Chronic Diastolic CHF -Compensated at present.  Code Status: Full Code Family Communication: Discussed with son [name] via telephone. Disposition Plan: Back to SNF when ready.   Consultants:  GI    Antibiotics:  None   Subjective: Answers simple questions.  Objective: Filed Vitals:   10/14/13 1554 10/14/13 2221 10/15/13 0415 10/15/13 1459  BP: 133/85 152/79 140/77 155/72  Pulse: 90 96 97 107  Temp: 98.4 F (36.9 C) 97.9 F (36.6 C) 98.5 F (36.9 C) 98 F (36.7 C)  TempSrc: Oral Axillary Oral Oral  Resp: 19 22 20 20   Height:      Weight:   74 kg (163 lb 2.3 oz)   SpO2: 99% 100% 100% 99%    Intake/Output Summary (Last 24 hours) at 10/15/13 1845 Last data filed at 10/15/13 1500  Gross per 24 hour  Intake 1808.75 ml  Output      0 ml  Net 1808.75 ml   Filed Weights   10/13/13 0557 10/14/13 0439 10/15/13 0415  Weight: 74.1 kg (163 lb 5.8 oz) 73.9 kg (162 lb 14.7 oz) 74 kg (163 lb 2.3 oz)    Exam:   General:  Awake, calm  Cardiovascular: RRR  Respiratory: CTA B  Abdomen: Soft, non-distended  Extremities: no C/C/E   Neurologic:  Non-focal  Data Reviewed: Basic Metabolic  Panel:  Recent Labs Lab 10/10/13 0100  10/11/13 0503 10/12/13 0537 10/13/13 0413 10/14/13 0445 10/15/13 0505 10/15/13 0507  NA  --   < > 152* 151* 147 144  --  147  K  --   < > 3.0* 2.9* 2.9* 2.9*  --  3.0*  CL  --   < > 129* 127* 122* 119*  --  122*  CO2  --   < > 12* 13* 13* 15*  --  16*  GLUCOSE  --   < > 131* 144* 150* 143*  --  136*  BUN  --   < > 24* 20 14 9   --  6  CREATININE  --   < > 1.19* 1.20* 1.10 1.05  --  0.99  CALCIUM  --   < > 9.5 9.8 9.5 9.4  --  9.2  MG 1.7  --   --   --   --   --  1.5  --   < > = values in this interval not displayed. Liver Function Tests:  Recent Labs Lab 10/09/13 1906  AST 20  ALT 10  ALKPHOS 72  BILITOT 0.2*  PROT 6.1  ALBUMIN 3.1*    Recent Labs Lab 10/09/13 1906  LIPASE 19   No results found for this basename: AMMONIA,  in the last 168 hours CBC:  Recent Labs Lab 10/09/13 1906 10/10/13 0103 10/11/13 0503 10/12/13 0537 10/14/13 0445  WBC 5.8 6.2 7.1 7.1 5.4  NEUTROABS 4.2  --   --   --   --   HGB 7.8* 7.9* 8.2* 8.0* 7.4*  HCT 23.3* 23.3* 24.4* 23.6* 21.7*  MCV 99.6 100.0 100.0 98.3 97.3  PLT 133* 137* 157 155 157   Cardiac Enzymes: No results found for this basename: CKTOTAL, CKMB, CKMBINDEX, TROPONINI,  in the last 168 hours BNP (last 3 results)  Recent Labs  05/24/13 0250  PROBNP 3915.0*   CBG: No results found for this basename: GLUCAP,  in the last 168 hours  Recent Results (from the past 240 hour(s))  MRSA PCR SCREENING     Status: None   Collection Time    10/10/13  3:04 AM      Result Value Ref Range Status   MRSA by PCR NEGATIVE  NEGATIVE Final   Comment:            The GeneXpert MRSA Assay (FDA     approved for NASAL specimens     only), is one component of a     comprehensive MRSA colonization     surveillance program. It is not     intended to diagnose MRSA     infection nor to guide or     monitor treatment for     MRSA infections.     Studies: Dg Abd 1 View  10/14/2013    CLINICAL DATA:  Distension.  Follow-up pseudo obstruction.  EXAM: ABDOMEN - 1 VIEW  COMPARISON:  10/13/2013.  FINDINGS: Gas distended colon with superior displacement of the sigmoid colon. Sigmoid colon measures up to 11.6 cm versus prior 13.5 cm.  The possibility of free intraperitoneal air cannot be assessed on a supine view.  Calcified tortuous aorta.  Post left hip replacement.  IMPRESSION: Gas distended colon with superior displacement of the sigmoid colon. Sigmoid colon measures up to 11.6 cm versus prior 13.5 cm.   Electronically Signed   By: Chauncey Cruel M.D.   On: 10/14/2013 10:39    Scheduled Meds: . bisacodyl  10 mg Rectal BID  . dorzolamide  1 drop Both Eyes TID  . heparin subcutaneous  5,000 Units Subcutaneous Q12H  . latanoprost  1 drop Both Eyes QHS  . levofloxacin (LEVAQUIN) IV  250 mg Intravenous Q24H  . potassium chloride  40 mEq Oral Once  . sodium chloride  3 mL Intravenous Q12H   Continuous Infusions: . dextrose 5 % and 0.9 % NaCl with KCl 20 mEq/L 75 mL/hr at 10/15/13 1520    Principal Problem:   Bowel obstruction Active Problems:   Atrial fibrillation with controlled ventricular response   Diastolic CHF, chronic   Hyperlipemia   Diabetes mellitus   Dementia   Dehydration   Acute renal failure   Anemia   Malnutrition of moderate degree    Time spent: 25 minutes. Greater than 50% of this time was spent in direct contact with the patient coordinating care.    Hosie Poisson  [facility] Pager [phone]  If 7PM-7AM, please contact night-coverage at [email or web address], password [facility] 10/15/2013, 6:45 PM  LOS: 6 days

## 2013-10-15 NOTE — Progress Notes (Signed)
    Progress Note   Subjective  feels like she needs to have a BM right now.    Objective   Vital signs in last 24 hours: Temp:  [97.9 F (36.6 C)-98.5 F (36.9 C)] 98.5 F (36.9 C) (05/13 0415) Pulse Rate:  [90-97] 97 (05/13 0415) Resp:  [19-22] 20 (05/13 0415) BP: (133-152)/(77-85) 140/77 mmHg (05/13 0415) SpO2:  [99 %-100 %] 100 % (05/13 0415) Weight:  [163 lb 2.3 oz (74 kg)] 163 lb 2.3 oz (74 kg) (05/13 0415) Last BM Date: 10/13/13 General:    white female in NAD. Very HOH Abdomen:  Soft, largely distended with tympany and abnormal bowel sounds.  Extremities:  Without edema. Neurologic:  Alert and oriented,  grossly normal neurologically. Psych:  Cooperative. Normal mood and affect. Lab Results:  Recent Labs  10/14/13 0445  WBC 5.4  HGB 7.4*  HCT 21.7*  PLT 157   BMET  Recent Labs  10/13/13 0413 10/14/13 0445 10/15/13 0507  NA 147 144 147  K 2.9* 2.9* 3.0*  CL 122* 119* 122*  CO2 13* 15* 16*  GLUCOSE 150* 143* 136*  BUN 14 9 6   CREATININE 1.10 1.05 0.99  CALCIUM 9.5 9.4 9.2    Studies/Results: Dg Abd 1 View  10/14/2013   CLINICAL DATA:  Distension.  Follow-up pseudo obstruction.  EXAM: ABDOMEN - 1 VIEW  COMPARISON:  10/13/2013.  FINDINGS: Gas distended colon with superior displacement of the sigmoid colon. Sigmoid colon measures up to 11.6 cm versus prior 13.5 cm.  The possibility of free intraperitoneal air cannot be assessed on a supine view.  Calcified tortuous aorta.  Post left hip replacement.  IMPRESSION: Gas distended colon with superior displacement of the sigmoid colon. Sigmoid colon measures up to 11.6 cm versus prior 13.5 cm.   Electronically Signed   By: Chauncey Cruel M.D.   On: 10/14/2013 10:39     Assessment / Plan:   1. Pseudoobstruction, likely acute on chronic. Yesterday's KUB does show some improvement in sigmoid distention but on exam today I cannot appreciate any improvement. Abdomen is soft and nontender but markedly distention with  abnormal bowel sounds.   Potassium remains low (3.0), see #2.  Try and avoid laying flat of back in bed, rotate side lying positions to mobilize bowel gas ( I discussed this with her nurse today). Continue BID Dulcolax supp.   2. Persistent hypokalemia. Mg+ level 5/8 was low normal at 1.7. She has received several runs of IV potassium since admission but potassium only up to 3.0 (highest it has been in days). I added 10mEq of K+ to IVF yesterday. She is getting more IV K+ runs now.    Addendum:  Patient just had extremely large BM. Continue present course.     LOS: 6 days   Willia Craze  10/15/2013, 9:20 AM   ________________________________________________________________________  Velora Heckler GI MD note:  I personally examined the patient, reviewed the data and agree with the assessment and plan described above.  Very large BM today. Abd soft, non tender.  Slowly improving but hypokalemia continues. Would continue current treatments, K replacement.  If she can get out of bed, ambulate with assist or at least get to chair that will probably help restore gut function.   Owens Loffler, MD [facility] Pager [phone]

## 2013-10-15 NOTE — Progress Notes (Signed)
Clinical Social Work  Per MD, patient not medically stable to DC today but possibly ready on 5/14. CSW spoke with SNF and updated them on patient's DC plans.  Sindy Messing, LCSW (Coverage for [employer])

## 2013-10-16 DIAGNOSIS — N179 Acute kidney failure, unspecified: Secondary | ICD-10-CM | POA: Diagnosis not present

## 2013-10-16 DIAGNOSIS — K560 Paralytic ileus: Secondary | ICD-10-CM | POA: Diagnosis not present

## 2013-10-16 DIAGNOSIS — K56609 Unspecified intestinal obstruction, unspecified as to partial versus complete obstruction: Secondary | ICD-10-CM | POA: Diagnosis not present

## 2013-10-16 DIAGNOSIS — E860 Dehydration: Secondary | ICD-10-CM | POA: Diagnosis not present

## 2013-10-16 LAB — BASIC METABOLIC PANEL
BUN: 4 mg/dL — ABNORMAL LOW (ref 6–23)
CALCIUM: 8.9 mg/dL (ref 8.4–10.5)
CHLORIDE: 115 meq/L — AB (ref 96–112)
CO2: 17 mEq/L — ABNORMAL LOW (ref 19–32)
Creatinine, Ser: 0.98 mg/dL (ref 0.50–1.10)
GFR calc non Af Amer: 50 mL/min — ABNORMAL LOW (ref >90)
GFR, EST AFRICAN AMERICAN: 58 mL/min — AB (ref >90)
Glucose, Bld: 165 mg/dL — ABNORMAL HIGH (ref 70–99)
Potassium: 3.4 mEq/L — ABNORMAL LOW (ref 3.7–5.3)
Sodium: 145 mEq/L (ref 137–147)

## 2013-10-16 LAB — MAGNESIUM: MAGNESIUM: 1.5 mg/dL (ref 1.5–2.5)

## 2013-10-16 MED ORDER — BOOST / RESOURCE BREEZE PO LIQD
1.0000 | Freq: Two times a day (BID) | ORAL | Status: DC
  Administered 2013-10-17 – 2013-10-23 (×9): 1 via ORAL

## 2013-10-16 MED ORDER — SODIUM CHLORIDE 0.9 % IV SOLN
INTRAVENOUS | Status: DC
  Administered 2013-10-16: 23:00:00 via INTRAVENOUS

## 2013-10-16 NOTE — Progress Notes (Signed)
Patient's daughter brought patient her hearing aide for her left ear. Daughter placed hearing aide in her ear and patient reports being able to hear much better. Patient asked RN what time her procedure would be in the morning. RN called endoscopy and was told that her procedure would be at 1130 in the morning. Patient was notified of the scheduled procedure time.

## 2013-10-16 NOTE — Progress Notes (Signed)
NUTRITION FOLLOW UP  Intervention:   -Recommend Resource Breeze po BID, each supplement provides 250 kcal and 9 grams of protein -Will continue to monitor  Nutrition Dx:   Inadequate oral intake related to nausea/mouth sores/abd distension as evidenced by PO intake <75% for two weeks-ongoing   Goal:   Pt to meet >/= 90% of their estimated nutrition needs- unmet     Monitor:   Diet order, GI profile, labs, weights, skin integrity    Assessment:   5/08:  -Pt's daughter reported decreased appetite for two weeks  -Had downgraded diet to puree 2-3 days ago as pt developed mouth sores and required softer foods to tolerate  -Family had been encouraging different supplements-Mightyshakes, MagicCup and ice creams. Pt consumed supplements but with minimal PO intake of other meal items  -Endorsed ongoing unintentional wt loss for past 5 months. Was unsure amount pt lost, however daughter indicated pt had been losing 1-2 lbs/week  -Daughter noted pt with large amounts of stomach distension. Was given fleet enema on 5/08 with results  -Currently NPO d/t possible colitis vs ileus vs bowel obstruction. NGT on low intermittent suction, no output documented  -Elevated BUN/Crt d/t dehydration  -Low K- being repleted  -Mg WNL  5/14: -Pt advanced to Soft diet on 5/13 -Has minimal PO intake, <10% of meals -Attempted to assist feeding pt; however pt refused additional food after one bite of peaches d/t nausea. Would likely not consume Ensure/MagicCup/Boost as they may exacerbate GI symptoms. Was able to tolerate gingerale and coffee -Will recommend Resource Breeze for supplement alternative -MD noted plan to perform flex sigmoidoscopy to remove air from colon, which may assist in improving appetite -Hypernatremia improved -Low K-being repleted -Mg WNL -Wt increased 10 lbs in 6 days, some of which may be attributed to fluids  Height: Ht Readings from Last 1 Encounters:  10/10/13 5\' 8"  (1.727 m)     Weight Status:   Wt Readings from Last 1 Encounters:  10/16/13 164 lb 0.4 oz (74.4 kg)  10/10/13 154 lbs  Re-estimated needs:  Kcal: 1900-2100  Protein: 80-90 gram  Fluid: >/=1900 ml/daily  Skin: three stage 2 PU, stage I PU on buttock  Diet Order: Criss Rosales   Intake/Output Summary (Last 24 hours) at 10/16/13 1604 Last data filed at 10/16/13 6789  Gross per 24 hour  Intake 1501.25 ml  Output      0 ml  Net 1501.25 ml    Last BM: 5/13   Labs:   Recent Labs Lab 10/10/13 0100  10/14/13 0445 10/15/13 0505 10/15/13 0507 10/16/13 1000 10/16/13 1005  NA  --   < > 144  --  147  --  145  K  --   < > 2.9*  --  3.0*  --  3.4*  CL  --   < > 119*  --  122*  --  115*  CO2  --   < > 15*  --  16*  --  17*  BUN  --   < > 9  --  6  --  4*  CREATININE  --   < > 1.05  --  0.99  --  0.98  CALCIUM  --   < > 9.4  --  9.2  --  8.9  MG 1.7  --   --  1.5  --  1.5  --   GLUCOSE  --   < > 143*  --  136*  --  165*  < > =  values in this interval not displayed.  CBG (last 3)  No results found for this basename: GLUCAP,  in the last 72 hours  Scheduled Meds: . bisacodyl  10 mg Rectal BID  . dorzolamide  1 drop Both Eyes TID  . heparin subcutaneous  5,000 Units Subcutaneous Q12H  . latanoprost  1 drop Both Eyes QHS  . potassium chloride  40 mEq Oral Once  . sodium chloride  3 mL Intravenous Q12H    Continuous Infusions:   Atlee Abide MS RD LDN Clinical Dietitian [phone]

## 2013-10-16 NOTE — Progress Notes (Signed)
[facility] PROGRESS NOTE  Maria Christian HGD:[id number] DOB: 02/04/1925 DOA: 10/09/2013 PCP: Ezequiel Kayser, MD Brief HPI: Maria Christian is a 80 y.o. female, with history of mild dementia, history of recurrent volvulus status post flexible sigmoidoscopy in December of last year, he presents with complaints of abdominal pain, nausea, vomiting and watery diarrhea for the last 24 hours, patient is confused and lethargic, can't give any reliable history, she is daughter and sister at bedside to give history, they report patient had similar episode a few months ago where she was diagnosed with low levels, patient has been having watery small amount bowel movements, patient was Hemoccult negative, she is afebrile, complains of mild abdominal pain, patient hemoglobin is 7.8, she is Hemoccult negative, currently denies any coffee-ground emesis, as well patient workup was significant for mild acute renal failure, hypokalemia, and hypernatremia. Patient abdominal x-ray did show evidence of ileus versus bowel obstruction. CT abdomen and pelvis showed mulitple small bowel and colonic air fluid levels with gaseous distention of the colon. She was put on clears and we have advanced her diet as tolerated. GI consulted and she is scheduled for sigmoidoscopy in am.   Assessment/Plan: Ileus  -Patient has had several BMs since admission. -No n/v. -Tube is out and she is tolerating liquid diet, and we have advanced to soft yesterday.  -Will continue to advance diet as tolerated. -Correct electrolyte abnormalities. -X rays show persistent gaseous distention  Hypokalemia -Continue to replete PO and IV. -Mg ok at 1.7. -Has received over 200 meq of K since admission and K remains low.  Hypernatremia -Likely related to free water deficit from decreased PO intake. -Resolved with dextrose fluids. Will d/c the fluids and watch her electrolytes.   ARF -RESOLVED. -Likely 2/2 prerenal azotemia and  dehydration.  Anemia - No indication for acute transfusion.  Chronic Diastolic CHF -Compensated at present.  Code Status: Full Code Family Communication: Discussed with son [name] via telephoneon 5/14 Disposition Plan: Back to SNF when ready.   Consultants:  GI    Antibiotics:  None   Subjective: Answers simple questions.  Objective: Filed Vitals:   10/15/13 1459 10/15/13 2057 10/16/13 0400 10/16/13 0456  BP: 155/72 155/93 155/81   Pulse: 107 102 97   Temp: 98 F (36.7 C) 98.4 F (36.9 C) 98.1 F (36.7 C)   TempSrc: Oral Oral Oral   Resp: 20 20 20    Height:      Weight:    74.4 kg (164 lb 0.4 oz)  SpO2: 99% 100% 99%     Intake/Output Summary (Last 24 hours) at 10/16/13 1031 Last data filed at 10/16/13 9622  Gross per 24 hour  Intake 2236.25 ml  Output      0 ml  Net 2236.25 ml   Filed Weights   10/14/13 0439 10/15/13 0415 10/16/13 0456  Weight: 73.9 kg (162 lb 14.7 oz) 74 kg (163 lb 2.3 oz) 74.4 kg (164 lb 0.4 oz)    Exam:   General:  Awake, calm  Cardiovascular: RRR  Respiratory: CTA B  Abdomen: Soft, non-distended  Extremities: no C/C/E   Neurologic:  Non-focal  Data Reviewed: Basic Metabolic Panel:  Recent Labs Lab 10/10/13 0100  10/11/13 0503 10/12/13 0537 10/13/13 0413 10/14/13 0445 10/15/13 0505 10/15/13 0507  NA  --   < > 152* 151* 147 144  --  147  K  --   < > 3.0* 2.9* 2.9* 2.9*  --  3.0*  CL  --   < >  129* 127* 122* 119*  --  122*  CO2  --   < > 12* 13* 13* 15*  --  16*  GLUCOSE  --   < > 131* 144* 150* 143*  --  136*  BUN  --   < > 24* 20 14 9   --  6  CREATININE  --   < > 1.19* 1.20* 1.10 1.05  --  0.99  CALCIUM  --   < > 9.5 9.8 9.5 9.4  --  9.2  MG 1.7  --   --   --   --   --  1.5  --   < > = values in this interval not displayed. Liver Function Tests:  Recent Labs Lab 10/09/13 1906  AST 20  ALT 10  ALKPHOS 72  BILITOT 0.2*  PROT 6.1  ALBUMIN 3.1*    Recent Labs Lab 10/09/13 1906  LIPASE 19   No  results found for this basename: AMMONIA,  in the last 168 hours CBC:  Recent Labs Lab 10/09/13 1906 10/10/13 0103 10/11/13 0503 10/12/13 0537 10/14/13 0445  WBC 5.8 6.2 7.1 7.1 5.4  NEUTROABS 4.2  --   --   --   --   HGB 7.8* 7.9* 8.2* 8.0* 7.4*  HCT 23.3* 23.3* 24.4* 23.6* 21.7*  MCV 99.6 100.0 100.0 98.3 97.3  PLT 133* 137* 157 155 157   Cardiac Enzymes: No results found for this basename: CKTOTAL, CKMB, CKMBINDEX, TROPONINI,  in the last 168 hours BNP (last 3 results)  Recent Labs  05/24/13 0250  PROBNP 3915.0*   CBG: No results found for this basename: GLUCAP,  in the last 168 hours  Recent Results (from the past 240 hour(s))  MRSA PCR SCREENING     Status: None   Collection Time    10/10/13  3:04 AM      Result Value Ref Range Status   MRSA by PCR NEGATIVE  NEGATIVE Final   Comment:            The GeneXpert MRSA Assay (FDA     approved for NASAL specimens     only), is one component of a     comprehensive MRSA colonization     surveillance program. It is not     intended to diagnose MRSA     infection nor to guide or     monitor treatment for     MRSA infections.     Studies: No results found.  Scheduled Meds: . bisacodyl  10 mg Rectal BID  . dorzolamide  1 drop Both Eyes TID  . heparin subcutaneous  5,000 Units Subcutaneous Q12H  . latanoprost  1 drop Both Eyes QHS  . potassium chloride  40 mEq Oral Once  . sodium chloride  3 mL Intravenous Q12H   Continuous Infusions: . dextrose 5 % and 0.9 % NaCl with KCl 20 mEq/L 75 mL/hr at 10/16/13 0352    Principal Problem:   Bowel obstruction Active Problems:   Atrial fibrillation with controlled ventricular response   Diastolic CHF, chronic   Hyperlipemia   Diabetes mellitus   Dementia   Dehydration   Acute renal failure   Anemia   Malnutrition of moderate degree        Hosie Poisson  [facility] Pager [phone]  If 7PM-7AM, please contact night-coverage at [email or web address], password  [facility] 10/16/2013, 10:31 AM  LOS: 7 days

## 2013-10-16 NOTE — Progress Notes (Signed)
[facility] Progress Note    Since last GI note: Had two very large BMs yesterday.  Not eating much.  Does not ambulate.  Objective: Vital signs in last 24 hours: Temp:  [98 F (36.7 C)-98.4 F (36.9 C)] 98.1 F (36.7 C) (05/14 0400) Pulse Rate:  [97-107] 97 (05/14 0400) Resp:  [20] 20 (05/14 0400) BP: (155)/(72-93) 155/81 mmHg (05/14 0400) SpO2:  [99 %-100 %] 99 % (05/14 0400) Weight:  [164 lb 0.4 oz (74.4 kg)] 164 lb 0.4 oz (74.4 kg) (05/14 0456) Last BM Date: 10/13/13 General: alert and oriented times 2 Heart: regular rate and rythm Abdomen: soft but distended, non-tender, + bowel sounds   Lab Results:  Recent Labs  10/14/13 0445  WBC 5.4  HGB 7.4*  PLT 157  MCV 97.3    Recent Labs  10/14/13 0445 10/15/13 0507  NA 144 147  K 2.9* 3.0*  CL 119* 122*  CO2 15* 16*  GLUCOSE 143* 136*  BUN 9 6  CREATININE 1.05 0.99  CALCIUM 9.4 9.2   Studies/Results: Dg Abd 1 View  10/14/2013   CLINICAL DATA:  Distension.  Follow-up pseudo obstruction.  EXAM: ABDOMEN - 1 VIEW  COMPARISON:  10/13/2013.  FINDINGS: Gas distended colon with superior displacement of the sigmoid colon. Sigmoid colon measures up to 11.6 cm versus prior 13.5 cm.  The possibility of free intraperitoneal air cannot be assessed on a supine view.  Calcified tortuous aorta.  Post left hip replacement.  IMPRESSION: Gas distended colon with superior displacement of the sigmoid colon. Sigmoid colon measures up to 11.6 cm versus prior 13.5 cm.   Electronically Signed   By: Chauncey Cruel M.D.   On: 10/14/2013 10:39     Medications: Scheduled Meds: . bisacodyl  10 mg Rectal BID  . dorzolamide  1 drop Both Eyes TID  . heparin subcutaneous  5,000 Units Subcutaneous Q12H  . latanoprost  1 drop Both Eyes QHS  . potassium chloride  40 mEq Oral Once  . sodium chloride  3 mL Intravenous Q12H   Continuous Infusions: . dextrose 5 % and 0.9 % NaCl with KCl 20 mEq/L 75 mL/hr at 10/16/13 0352   PRN  Meds:.acetaminophen, albuterol, ALPRAZolam, ondansetron (ZOFRAN) IV    Assessment/Plan: 80 y.o. female with persistent colonic distension, ileus  Her hypokalemia is likely contributing to her colonic dysfunction and the diarrhrea resulting from colonic distension is likely contributing to her hypokalemia.  Neostigmine can very useful in pseudoobstruction but at her age, significant electrolyte disturbance I am very reluctant to order it.  I will instead plan on flex sigmoidoscopy tomorrow to remove as much air as possible from colon. She had sigmoid volvulous in the recent past (treated twice with flex sig).  That was not seen on admitting CT.  Continue to correct K.  GI pathogen panel was ordered several days ago, not collected.  Will order C. Diff today.    Milus Banister, MD  10/16/2013, 7:35 AM [facility] Pager [phone]

## 2013-10-17 ENCOUNTER — Encounter (HOSPITAL_COMMUNITY)
Admission: EM | Disposition: A | Discharge status: Skilled Nursing Facility | Payer: Self-pay | Source: Home / Self Care | Attending: Internal Medicine

## 2013-10-17 DIAGNOSIS — K560 Paralytic ileus: Secondary | ICD-10-CM | POA: Diagnosis not present

## 2013-10-17 DIAGNOSIS — I509 Heart failure, unspecified: Secondary | ICD-10-CM | POA: Diagnosis not present

## 2013-10-17 DIAGNOSIS — E876 Hypokalemia: Secondary | ICD-10-CM | POA: Diagnosis present

## 2013-10-17 DIAGNOSIS — I5032 Chronic diastolic (congestive) heart failure: Secondary | ICD-10-CM | POA: Diagnosis not present

## 2013-10-17 DIAGNOSIS — E860 Dehydration: Secondary | ICD-10-CM | POA: Diagnosis not present

## 2013-10-17 DIAGNOSIS — N179 Acute kidney failure, unspecified: Secondary | ICD-10-CM | POA: Diagnosis not present

## 2013-10-17 DIAGNOSIS — A0472 Enterocolitis due to Clostridium difficile, not specified as recurrent: Secondary | ICD-10-CM | POA: Diagnosis present

## 2013-10-17 LAB — BASIC METABOLIC PANEL
BUN: 4 mg/dL — ABNORMAL LOW (ref 6–23)
CHLORIDE: 115 meq/L — AB (ref 96–112)
CO2: 18 mEq/L — ABNORMAL LOW (ref 19–32)
CREATININE: 0.94 mg/dL (ref 0.50–1.10)
Calcium: 8.8 mg/dL (ref 8.4–10.5)
GFR calc Af Amer: 61 mL/min — ABNORMAL LOW (ref >90)
GFR, EST NON AFRICAN AMERICAN: 53 mL/min — AB (ref >90)
Glucose, Bld: 120 mg/dL — ABNORMAL HIGH (ref 70–99)
POTASSIUM: 2.7 meq/L — AB (ref 3.7–5.3)
Sodium: 145 mEq/L (ref 137–147)

## 2013-10-17 LAB — GI PATHOGEN PANEL BY PCR, STOOL
C DIFFICILE TOXIN A/B: NEGATIVE
Campylobacter by PCR: NEGATIVE
Cryptosporidium by PCR: NEGATIVE
E COLI 0157 BY PCR: NEGATIVE
E coli (ETEC) LT/ST: NEGATIVE
E coli (STEC): NEGATIVE
G LAMBLIA BY PCR: NEGATIVE
Norovirus GI/GII: NEGATIVE
ROTAVIRUS A BY PCR: NEGATIVE
Salmonella by PCR: NEGATIVE
Shigella by PCR: NEGATIVE

## 2013-10-17 LAB — CBC WITH DIFFERENTIAL/PLATELET
BASOS ABS: 0 10*3/uL (ref 0.0–0.1)
BASOS PCT: 1 % (ref 0–1)
EOS ABS: 0.2 10*3/uL (ref 0.0–0.7)
Eosinophils Relative: 3 % (ref 0–5)
HCT: 23.8 % — ABNORMAL LOW (ref 36.0–46.0)
HEMOGLOBIN: 8.2 g/dL — AB (ref 12.0–15.0)
Lymphocytes Relative: 18 % (ref 12–46)
Lymphs Abs: 1 10*3/uL (ref 0.7–4.0)
MCH: 33.9 pg (ref 26.0–34.0)
MCHC: 34.5 g/dL (ref 30.0–36.0)
MCV: 98.3 fL (ref 78.0–100.0)
Monocytes Absolute: 0.6 10*3/uL (ref 0.1–1.0)
Monocytes Relative: 11 % (ref 3–12)
NEUTROS ABS: 3.8 10*3/uL (ref 1.7–7.7)
NEUTROS PCT: 68 % (ref 43–77)
Platelets: 177 10*3/uL (ref 150–400)
RBC: 2.42 MIL/uL — ABNORMAL LOW (ref 3.87–5.11)
RDW: 18.1 % — AB (ref 11.5–15.5)
WBC: 5.6 10*3/uL (ref 4.0–10.5)

## 2013-10-17 LAB — CLOSTRIDIUM DIFFICILE BY PCR: Toxigenic C. Difficile by PCR: POSITIVE — AB

## 2013-10-17 LAB — FOLATE: Folate: 16.8 ng/mL

## 2013-10-17 LAB — IRON AND TIBC
IRON: 55 ug/dL (ref 42–135)
Saturation Ratios: 30 % (ref 20–55)
TIBC: 185 ug/dL — ABNORMAL LOW (ref 250–470)
UIBC: 130 ug/dL (ref 125–400)

## 2013-10-17 LAB — MAGNESIUM: Magnesium: 1.5 mg/dL (ref 1.5–2.5)

## 2013-10-17 LAB — RETICULOCYTES
RBC.: 2.33 MIL/uL — ABNORMAL LOW (ref 3.87–5.11)
RETIC COUNT ABSOLUTE: 114.2 10*3/uL (ref 19.0–186.0)
RETIC CT PCT: 4.9 % — AB (ref 0.4–3.1)

## 2013-10-17 LAB — VITAMIN B12: Vitamin B-12: 444 pg/mL (ref 211–911)

## 2013-10-17 LAB — FERRITIN: FERRITIN: 177 ng/mL (ref 10–291)

## 2013-10-17 SURGERY — SIGMOIDOSCOPY, FLEXIBLE
Anesthesia: Moderate Sedation

## 2013-10-17 MED ORDER — POTASSIUM CHLORIDE CRYS ER 20 MEQ PO TBCR
40.0000 meq | EXTENDED_RELEASE_TABLET | Freq: Two times a day (BID) | ORAL | Status: AC
  Administered 2013-10-17 (×2): 40 meq via ORAL
  Filled 2013-10-17 (×2): qty 2

## 2013-10-17 MED ORDER — MAGNESIUM SULFATE 40 MG/ML IJ SOLN
2.0000 g | Freq: Once | INTRAMUSCULAR | Status: AC
  Administered 2013-10-17: 2 g via INTRAVENOUS
  Filled 2013-10-17: qty 50

## 2013-10-17 MED ORDER — SACCHAROMYCES BOULARDII 250 MG PO CAPS
250.0000 mg | ORAL_CAPSULE | Freq: Two times a day (BID) | ORAL | Status: DC
  Administered 2013-10-17 – 2013-10-23 (×13): 250 mg via ORAL
  Filled 2013-10-17 (×16): qty 1

## 2013-10-17 MED ORDER — POTASSIUM CHLORIDE 10 MEQ/100ML IV SOLN
10.0000 meq | INTRAVENOUS | Status: AC
  Administered 2013-10-17 (×4): 10 meq via INTRAVENOUS
  Filled 2013-10-17 (×4): qty 100

## 2013-10-17 MED ORDER — METRONIDAZOLE 500 MG PO TABS
500.0000 mg | ORAL_TABLET | Freq: Three times a day (TID) | ORAL | Status: DC
  Administered 2013-10-17 – 2013-10-23 (×19): 500 mg via ORAL
  Filled 2013-10-17 (×22): qty 1

## 2013-10-17 NOTE — Progress Notes (Signed)
    Progress Note   Subjective  her stomach feels a lot better   Objective   Vital signs in last 24 hours: Temp:  [98.2 F (36.8 C)-98.7 F (37.1 C)] 98.2 F (36.8 C) (05/15 0739) Pulse Rate:  [85-94] 85 (05/15 0355) Resp:  [18] 18 (05/15 0355) BP: (153-155)/(59-85) 153/74 mmHg (05/15 0355) SpO2:  [100 %] 100 % (05/15 0355) Weight:  [164 lb 0.4 oz (74.4 kg)] 164 lb 0.4 oz (74.4 kg) (05/15 0355) Last BM Date: 10/13/13 General:    Pleasant white female in NAD Abdomen:  Soft, nontender, protuberant but not really distended.  A few bowel sounds.  Neurologic:  Alert and oriented,  grossly normal neurologically. Psych:  Cooperative. Normal mood and affect.  Lab Results:  BMET  Recent Labs  10/15/13 0507 10/16/13 1005  NA 147 145  K 3.0* 3.4*  CL 122* 115*  CO2 16* 17*  GLUCOSE 136* 165*  BUN 6 4*  CREATININE 0.99 0.98  CALCIUM 9.2 8.9     Assessment / Plan:   1. Pseudoobstruction. Interesting, her abdominal exam is nearly normal today  This may be combination of getting K+ in normal range, rolling her in bed, dulcolax suppositories. Recommend keeping K+ near 4.0 range. Will d/c suppositories now. No decompressive sigmoidoscopy needed now.  2. C-diff. This is interesting. May have been contributing to pseudoobstruction though she hasn't yet received any treatment for the c-diff and abdominal exam has drastically improved overnight.  At any rate, needs treatment. PO Flagyl has already been ordered to start this am. Will add florastor   LOS: 8 days   Willia Craze  10/17/2013, 10:18 AM   ________________________________________________________________________  Velora Heckler GI MD note:  I personally examined the patient, reviewed the data and agree with the assessment and plan described above.  C. Diff PCR + and so flex sig cancelled. CIinically much improved as well. Should complete 2 week course of flagyl orally.  Advance diet as tolerated.     Owens Loffler,  MD [facility] Pager [phone]

## 2013-10-17 NOTE — Progress Notes (Signed)
[facility] PROGRESS NOTE  Maria Christian XVQ:[id number] DOB: 05-04-1925 DOA: 10/09/2013 PCP: Ezequiel Kayser, MD Brief HPI: Maria Christian is a 80 y.o. female, with history of mild dementia, history of recurrent volvulus status post flexible sigmoidoscopy in December of last year, he presents with complaints of abdominal pain, nausea, vomiting and watery diarrhea for the last 24 hours, patient is confused and lethargic, can't give any reliable history, she is daughter and sister at bedside to give history, they report patient had similar episode a few months ago where she was diagnosed with low levels, patient has been having watery small amount bowel movements, patient was Hemoccult negative, she is afebrile, complains of mild abdominal pain, patient hemoglobin is 7.8, she is Hemoccult negative, currently denies any coffee-ground emesis, as well patient workup was significant for mild acute renal failure, hypokalemia, and hypernatremia. Patient abdominal x-ray did show evidence of ileus versus bowel obstruction. CT abdomen and pelvis showed mulitple small bowel and colonic air fluid levels with gaseous distention of the colon. She was put on clears and we have advanced her diet as tolerated. GI consulted. Meanwhile stool for c diff came backp ositive and we have started her on po flagyl. Recommend 2 weeks of treatment.   Assessment/Plan: Ileus  -Patient has had several BMs since admission. -No n/v. -Tube is out and she is tolerating liquid diet, and we have advanced to soft diet -Will continue to advance diet as tolerated. -Correct electrolyte abnormalities.   Hypokalemia -Continue to replete PO and IV. -Mg ok at 1.5, replete with 2 gms of mag sulfate.   Hypernatremia -Likely related to free water deficit from decreased PO intake. -Resolved with dextrose fluids. Will d/c the fluids and watch her electrolytes.   ARF -RESOLVED. -Likely 2/2 prerenal azotemia and  dehydration.  Anemia - No indication for acute transfusion.  Chronic Diastolic CHF -Compensated at present.   C diff diarrhea: Started on po flagyl  , recommend 2 week therapy.  Appreciate GI recommendations.   Code Status: Full Code Family Communication: Discussed with son [name] via telephoneon 5/14 Disposition Plan: Back to SNF when ready.   Consultants:  GI    Antibiotics:  None   Subjective: Answers simple questions. Denies any new complaints.   Objective: Filed Vitals:   10/16/13 2234 10/17/13 0355 10/17/13 0739 10/17/13 1443  BP:  153/74  148/68  Pulse:  85  81  Temp:  98.5 F (36.9 C) 98.2 F (36.8 C) 98 F (36.7 C)  TempSrc:  Oral Oral Oral  Resp: 18 18  18   Height:      Weight:  74.4 kg (164 lb 0.4 oz)    SpO2: 100% 100%  99%    Intake/Output Summary (Last 24 hours) at 10/17/13 1452 Last data filed at 10/16/13 2300  Gross per 24 hour  Intake    244 ml  Output      0 ml  Net    244 ml   Filed Weights   10/15/13 0415 10/16/13 0456 10/17/13 0355  Weight: 74 kg (163 lb 2.3 oz) 74.4 kg (164 lb 0.4 oz) 74.4 kg (164 lb 0.4 oz)    Exam:   General:  Awake, calm  Cardiovascular: RRR  Respiratory: CTA B  Abdomen: Soft, non-distended  Extremities: no C/C/E   Neurologic:  Non-focal  Data Reviewed: Basic Metabolic Panel:  Recent Labs Lab 10/13/13 0413 10/14/13 0445 10/15/13 0505 10/15/13 0507 10/16/13 1000 10/16/13 1005 10/17/13 1000 10/17/13 1330  NA 147 144  --  147  --  145 145  --   K 2.9* 2.9*  --  3.0*  --  3.4* 2.7*  --   CL 122* 119*  --  122*  --  115* 115*  --   CO2 13* 15*  --  16*  --  17* 18*  --   GLUCOSE 150* 143*  --  136*  --  165* 120*  --   BUN 14 9  --  6  --  4* 4*  --   CREATININE 1.10 1.05  --  0.99  --  0.98 0.94  --   CALCIUM 9.5 9.4  --  9.2  --  8.9 8.8  --   MG  --   --  1.5  --  1.5  --   --  1.5   Liver Function Tests: No results found for this basename: AST, ALT, ALKPHOS, BILITOT, PROT,  ALBUMIN,  in the last 168 hours No results found for this basename: LIPASE, AMYLASE,  in the last 168 hours No results found for this basename: AMMONIA,  in the last 168 hours CBC:  Recent Labs Lab 10/11/13 0503 10/12/13 0537 10/14/13 0445 10/17/13 1000  WBC 7.1 7.1 5.4 5.6  NEUTROABS  --   --   --  3.8  HGB 8.2* 8.0* 7.4* 8.2*  HCT 24.4* 23.6* 21.7* 23.8*  MCV 100.0 98.3 97.3 98.3  PLT 157 155 157 177   Cardiac Enzymes: No results found for this basename: CKTOTAL, CKMB, CKMBINDEX, TROPONINI,  in the last 168 hours BNP (last 3 results)  Recent Labs  05/24/13 0250  PROBNP 3915.0*   CBG: No results found for this basename: GLUCAP,  in the last 168 hours  Recent Results (from the past 240 hour(s))  MRSA PCR SCREENING     Status: None   Collection Time    10/10/13  3:04 AM      Result Value Ref Range Status   MRSA by PCR NEGATIVE  NEGATIVE Final   Comment:            The GeneXpert MRSA Assay (FDA     approved for NASAL specimens     only), is one component of a     comprehensive MRSA colonization     surveillance program. It is not     intended to diagnose MRSA     infection nor to guide or     monitor treatment for     MRSA infections.  CLOSTRIDIUM DIFFICILE BY PCR     Status: Abnormal   Collection Time    10/17/13  7:30 AM      Result Value Ref Range Status   C difficile by pcr POSITIVE (*) NEGATIVE Final   Comment: CRITICAL RESULT CALLED TO, READ BACK BY AND VERIFIED WITH:     Elane Fritz RN 9:35 10/17/13 ([email or web address])     Performed at [facility]     Studies: No results found.  Scheduled Meds: . dorzolamide  1 drop Both Eyes TID  . feeding supplement (RESOURCE BREEZE)  1 Container Oral BID BM  . heparin subcutaneous  5,000 Units Subcutaneous Q12H  . latanoprost  1 drop Both Eyes QHS  . magnesium sulfate 1 - 4 g bolus IVPB  2 g Intravenous Once  . metroNIDAZOLE  500 mg Oral 3 times per day  . potassium chloride  10 mEq Intravenous Q1 Hr x 4  .  potassium chloride  40 mEq Oral Once  .  potassium chloride  40 mEq Oral BID  . saccharomyces boulardii  250 mg Oral BID  . sodium chloride  3 mL Intravenous Q12H   Continuous Infusions: . sodium chloride 20 mL/hr at 10/16/13 2248    Principal Problem:   Bowel obstruction Active Problems:   Atrial fibrillation with controlled ventricular response   Diastolic CHF, chronic   Hyperlipemia   Diabetes mellitus   Dementia   Dehydration   Acute renal failure   Anemia   Malnutrition of moderate degree        Hosie Poisson  [facility] Pager [phone]  If 7PM-7AM, please contact night-coverage at [email or web address], password [email or web address] 10/17/2013, 2:52 PM  LOS: 8 days

## 2013-10-17 NOTE — Progress Notes (Signed)
CSW following for return to [facility] when medically ready, anticipating discharge tomorrow. CSW has completed FL2 & will continue to follow and assist with return.    Raynaldo Opitz, [facility] Clinical Social Worker cell #: [phone]

## 2013-10-17 NOTE — Progress Notes (Signed)
CRITICAL VALUE ALERT  Critical value received:  K 2.7  Date of notification:  10/17/13  Time of notification:  1120  Critical value read back: yes  Nurse who received alert:  Elane Fritz   MD notified (1st page):  Dr. Karleen Hampshire  Time of first page:  1122  MD notified (2nd page):  Time of second page:  Responding MD:  Dr. Karleen Hampshire  Time MD responded:  [phone]

## 2013-10-18 DIAGNOSIS — E860 Dehydration: Secondary | ICD-10-CM | POA: Diagnosis not present

## 2013-10-18 DIAGNOSIS — E876 Hypokalemia: Secondary | ICD-10-CM | POA: Diagnosis not present

## 2013-10-18 LAB — BASIC METABOLIC PANEL
BUN: 4 mg/dL — AB (ref 6–23)
CO2: 17 mEq/L — ABNORMAL LOW (ref 19–32)
CREATININE: 0.95 mg/dL (ref 0.50–1.10)
Calcium: 8.2 mg/dL — ABNORMAL LOW (ref 8.4–10.5)
Chloride: 114 mEq/L — ABNORMAL HIGH (ref 96–112)
GFR calc Af Amer: 61 mL/min — ABNORMAL LOW (ref >90)
GFR, EST NON AFRICAN AMERICAN: 52 mL/min — AB (ref >90)
Glucose, Bld: 125 mg/dL — ABNORMAL HIGH (ref 70–99)
POTASSIUM: 2.7 meq/L — AB (ref 3.7–5.3)
Sodium: 144 mEq/L (ref 137–147)

## 2013-10-18 LAB — POTASSIUM: Potassium: 3.1 mEq/L — ABNORMAL LOW (ref 3.7–5.3)

## 2013-10-18 LAB — MAGNESIUM: MAGNESIUM: 1.9 mg/dL (ref 1.5–2.5)

## 2013-10-18 MED ORDER — DICLOFENAC SODIUM 1 % TD GEL
2.0000 g | Freq: Four times a day (QID) | TRANSDERMAL | Status: AC
  Administered 2013-10-18 – 2013-10-20 (×8): 2 g via TOPICAL
  Filled 2013-10-18: qty 100

## 2013-10-18 MED ORDER — POTASSIUM CHLORIDE 10 MEQ/100ML IV SOLN
10.0000 meq | INTRAVENOUS | Status: AC
  Administered 2013-10-18 (×4): 10 meq via INTRAVENOUS
  Filled 2013-10-18 (×4): qty 100

## 2013-10-18 MED ORDER — POTASSIUM CHLORIDE CRYS ER 20 MEQ PO TBCR
40.0000 meq | EXTENDED_RELEASE_TABLET | Freq: Three times a day (TID) | ORAL | Status: AC
  Administered 2013-10-18 – 2013-10-19 (×6): 40 meq via ORAL
  Filled 2013-10-18 (×6): qty 2

## 2013-10-18 MED ORDER — CARBAMIDE PEROXIDE 6.5 % OT SOLN
5.0000 [drp] | Freq: Two times a day (BID) | OTIC | Status: AC
  Administered 2013-10-18 – 2013-10-23 (×10): 5 [drp] via OTIC
  Filled 2013-10-18: qty 15

## 2013-10-18 NOTE — Progress Notes (Signed)
[facility] PROGRESS NOTE  Maria Christian HEN:[id number] DOB: 08-26-1924 DOA: 10/09/2013 PCP: Ezequiel Kayser, MD Brief HPI: Maria Christian is a 80 y.o. female, with history of mild dementia, history of recurrent volvulus status post flexible sigmoidoscopy in December of last year, he presents with complaints of abdominal pain, nausea, vomiting and watery diarrhea for the last 24 hours, patient is confused and lethargic, can't give any reliable history, she is daughter and sister at bedside to give history, they report patient had similar episode a few months ago where she was diagnosed with low levels, patient has been having watery small amount bowel movements, patient was Hemoccult negative, she is afebrile, complains of mild abdominal pain, patient hemoglobin is 7.8, she is Hemoccult negative, currently denies any coffee-ground emesis, as well patient workup was significant for mild acute renal failure, hypokalemia, and hypernatremia. Patient abdominal x-ray did show evidence of ileus versus bowel obstruction. CT abdomen and pelvis showed mulitple small bowel and colonic air fluid levels with gaseous distention of the colon. She was put on clears and we have advanced her diet as tolerated. GI consulted. Meanwhile stool for c diff came backp ositive and we have started her on po flagyl. Recommend 2 weeks of treatment.   Assessment/Plan: Ileus  -Patient has had several BMs since admission. -No n/v. -Tube is out and she is tolerating liquid diet, and we have advanced to soft diet -Will continue to advance diet as tolerated. -Correct electrolyte abnormalities    Hypokalemia -Continue to replete PO and IV. -Mg ok at 1.5, replete with 2 gms of mag sulfate.  - repeat K is still at 2.7. 4 more runs of IV k and po potassium ordered.   Hypernatremia -Likely related to free water deficit from decreased PO intake. -Resolved with dextrose fluids. Will d/c the fluids and watch her  electrolytes.   ARF -RESOLVED. -Likely 2/2 prerenal azotemia and dehydration.  Anemia - No indication for acute transfusion.  Chronic Diastolic CHF -Compensated at present.   C diff diarrhea: Started on po flagyl  , recommend 2 week therapy.  Appreciate GI recommendations.   Code Status: Full Code Family Communication: Discussed with son Maria Christian via telephoneon 5/14 Disposition Plan: Back to SNF when ready.   Consultants:  GI    Antibiotics:  Flagyl 5/15  Subjective: Answers simple questions. Denies any new complaints.  Comfortable.   Objective: Filed Vitals:   10/17/13 1443 10/17/13 2226 10/18/13 0500 10/18/13 0624  BP: 148/68 149/66  146/77  Pulse: 81 66  82  Temp: 98 F (36.7 C) 97.8 F (36.6 C)  97.8 F (36.6 C)  TempSrc: Oral Oral  Oral  Resp: 18 18  18   Height:      Weight:   73.1 kg (161 lb 2.5 oz)   SpO2: 99% 100%  98%    Intake/Output Summary (Last 24 hours) at 10/18/13 0949 Last data filed at 10/17/13 2300  Gross per 24 hour  Intake    600 ml  Output      0 ml  Net    600 ml   Filed Weights   10/16/13 0456 10/17/13 0355 10/18/13 0500  Weight: 74.4 kg (164 lb 0.4 oz) 74.4 kg (164 lb 0.4 oz) 73.1 kg (161 lb 2.5 oz)    Exam:   General:  Awake, calm  Cardiovascular: RRR  Respiratory: CTA B  Abdomen: Soft, non-distended  Extremities: no C/C/E   Neurologic:  Non-focal  Data Reviewed: Basic Metabolic Panel:  Recent Labs  Lab 10/14/13 0445 10/15/13 0505 10/15/13 0507 10/16/13 1000 10/16/13 1005 10/17/13 1000 10/17/13 1330 10/18/13 0858  NA 144  --  147  --  145 145  --  144  K 2.9*  --  3.0*  --  3.4* 2.7*  --  2.7*  CL 119*  --  122*  --  115* 115*  --  114*  CO2 15*  --  16*  --  17* 18*  --  17*  GLUCOSE 143*  --  136*  --  165* 120*  --  125*  BUN 9  --  6  --  4* 4*  --  4*  CREATININE 1.05  --  0.99  --  0.98 0.94  --  0.95  CALCIUM 9.4  --  9.2  --  8.9 8.8  --  8.2*  MG  --  1.5  --  1.5  --   --  1.5  --     Liver Function Tests: No results found for this basename: AST, ALT, ALKPHOS, BILITOT, PROT, ALBUMIN,  in the last 168 hours No results found for this basename: LIPASE, AMYLASE,  in the last 168 hours No results found for this basename: AMMONIA,  in the last 168 hours CBC:  Recent Labs Lab 10/12/13 0537 10/14/13 0445 10/17/13 1000  WBC 7.1 5.4 5.6  NEUTROABS  --   --  3.8  HGB 8.0* 7.4* 8.2*  HCT 23.6* 21.7* 23.8*  MCV 98.3 97.3 98.3  PLT 155 157 177   Cardiac Enzymes: No results found for this basename: CKTOTAL, CKMB, CKMBINDEX, TROPONINI,  in the last 168 hours BNP (last 3 results)  Recent Labs  05/24/13 0250  PROBNP 3915.0*   CBG: No results found for this basename: GLUCAP,  in the last 168 hours  Recent Results (from the past 240 hour(s))  MRSA PCR SCREENING     Status: None   Collection Time    10/10/13  3:04 AM      Result Value Ref Range Status   MRSA by PCR NEGATIVE  NEGATIVE Final   Comment:            The GeneXpert MRSA Assay (FDA     approved for NASAL specimens     only), is one component of a     comprehensive MRSA colonization     surveillance program. It is not     intended to diagnose MRSA     infection nor to guide or     monitor treatment for     MRSA infections.  CLOSTRIDIUM DIFFICILE BY PCR     Status: Abnormal   Collection Time    10/17/13  7:30 AM      Result Value Ref Range Status   C difficile by pcr POSITIVE (*) NEGATIVE Final   Comment: CRITICAL RESULT CALLED TO, READ BACK BY AND VERIFIED WITH:     Elane Fritz RN 9:35 10/17/13 ([email or web address])     Performed at [facility]     Studies: No results found.  Scheduled Meds: . dorzolamide  1 drop Both Eyes TID  . feeding supplement (RESOURCE BREEZE)  1 Container Oral BID BM  . heparin subcutaneous  5,000 Units Subcutaneous Q12H  . latanoprost  1 drop Both Eyes QHS  . metroNIDAZOLE  500 mg Oral 3 times per day  . potassium chloride  10 mEq Intravenous Q1 Hr x 4  . potassium  chloride  40 mEq Oral Once  . potassium  chloride  40 mEq Oral TID  . saccharomyces boulardii  250 mg Oral BID  . sodium chloride  3 mL Intravenous Q12H   Continuous Infusions: . sodium chloride 20 mL/hr at 10/16/13 2248    Principal Problem:   Bowel obstruction Active Problems:   Atrial fibrillation with controlled ventricular response   Diastolic CHF, chronic   Hyperlipemia   Diabetes mellitus   Dementia   Dehydration   Acute renal failure   Anemia   Malnutrition of moderate degree   Enteritis due to Clostridium difficile   Hypokalemia        Hosie Poisson  [facility] Pager [phone]  If 7PM-7AM, please contact night-coverage at [email or web address], password [facility] 10/18/2013, 9:49 AM  LOS: 9 days

## 2013-10-18 NOTE — Progress Notes (Signed)
Panic potassium level 2.7. Dr. Karleen Hampshire notified.

## 2013-10-19 DIAGNOSIS — A0472 Enterocolitis due to Clostridium difficile, not specified as recurrent: Secondary | ICD-10-CM

## 2013-10-19 DIAGNOSIS — N179 Acute kidney failure, unspecified: Secondary | ICD-10-CM | POA: Diagnosis not present

## 2013-10-19 DIAGNOSIS — E860 Dehydration: Secondary | ICD-10-CM | POA: Diagnosis not present

## 2013-10-19 LAB — CBC
HCT: 23.6 % — ABNORMAL LOW (ref 36.0–46.0)
Hemoglobin: 8.1 g/dL — ABNORMAL LOW (ref 12.0–15.0)
MCH: 34.5 pg — ABNORMAL HIGH (ref 26.0–34.0)
MCHC: 34.3 g/dL (ref 30.0–36.0)
MCV: 100.4 fL — AB (ref 78.0–100.0)
Platelets: 173 10*3/uL (ref 150–400)
RBC: 2.35 MIL/uL — ABNORMAL LOW (ref 3.87–5.11)
RDW: 19 % — ABNORMAL HIGH (ref 11.5–15.5)
WBC: 5.2 10*3/uL (ref 4.0–10.5)

## 2013-10-19 LAB — POTASSIUM: Potassium: 3.2 mEq/L — ABNORMAL LOW (ref 3.7–5.3)

## 2013-10-19 LAB — BASIC METABOLIC PANEL
BUN: 3 mg/dL — ABNORMAL LOW (ref 6–23)
CALCIUM: 8.3 mg/dL — AB (ref 8.4–10.5)
CO2: 16 mEq/L — ABNORMAL LOW (ref 19–32)
Chloride: 114 mEq/L — ABNORMAL HIGH (ref 96–112)
Creatinine, Ser: 0.94 mg/dL (ref 0.50–1.10)
GFR, EST AFRICAN AMERICAN: 61 mL/min — AB (ref >90)
GFR, EST NON AFRICAN AMERICAN: 53 mL/min — AB (ref >90)
GLUCOSE: 106 mg/dL — AB (ref 70–99)
Potassium: 2.9 mEq/L — CL (ref 3.7–5.3)
Sodium: 143 mEq/L (ref 137–147)

## 2013-10-19 LAB — MAGNESIUM: Magnesium: 1.8 mg/dL (ref 1.5–2.5)

## 2013-10-19 MED ORDER — POTASSIUM CHLORIDE 10 MEQ/100ML IV SOLN
10.0000 meq | INTRAVENOUS | Status: AC
  Administered 2013-10-19 (×2): 10 meq via INTRAVENOUS
  Filled 2013-10-19 (×2): qty 100

## 2013-10-19 NOTE — Progress Notes (Signed)
CRITICAL VALUE ALERT  Critical value received:K+ 2.9  Date of notification:  10/19/2013  Time of notification:  0526  Critical value read back:yes  Nurse who received alert:  J.Kareema Keitt  MD notified (1st page):  T.Callahan  Time of first page:  0527  MD notified (2nd page):  Time of second page:  Responding MD:  T.Callahan  Time MD responded:  [phone]

## 2013-10-19 NOTE — Progress Notes (Signed)
[facility] PROGRESS NOTE  Maria Christian:[id number] DOB: 1925/04/03 DOA: 10/09/2013 PCP: Maria Kayser, MD Brief HPI: Maria Christian is a 80 y.o. female, with history of mild dementia, history of recurrent volvulus status post flexible sigmoidoscopy in December of last year, he presents with complaints of abdominal pain, nausea, vomiting and watery diarrhea for the last 24 hours, patient is confused and lethargic, can't give any reliable history, she is daughter and sister at bedside to give history, they report patient had similar episode a few months ago where she was diagnosed with low levels, patient has been having watery small amount bowel movements, patient was Hemoccult negative, she is afebrile, complains of mild abdominal pain, patient hemoglobin is 7.8, she is Hemoccult negative, currently denies any coffee-ground emesis, as well patient workup was significant for mild acute renal failure, hypokalemia, and hypernatremia. Patient abdominal x-ray did show evidence of ileus versus bowel obstruction. CT abdomen and pelvis showed mulitple small bowel and colonic air fluid levels with gaseous distention of the colon. She was put on clears and we have advanced her diet as tolerated. GI consulted. Meanwhile stool for c diff came backp ositive and we have started her on po flagyl. Recommend 2 weeks of treatment.   Assessment/Plan: IleUS: -improved.  -NG Tube is out and she is tolerating liquid diet, and we have advanced to soft diet -Will continue to advance diet as tolerated. -Correct electrolyte abnormalities    Hypokalemia -Continue to replete PO and IV. -Mg ok at 1.5, replete with 2 gms of mag sulfate.  - repeat K is still at 2.9. 4 more runs of IV k and po potassium ordered.   Hypernatremia -Likely related to free water deficit from decreased PO intake. -Resolved with dextrose fluids. Will d/c the fluids and watch her electrolytes.   ARF -RESOLVED. -Likely 2/2  prerenal azotemia and dehydration.  Anemia - No indication for acute transfusion.  Chronic Diastolic CHF -Compensated at present.   C diff diarrhea: Started on po flagyl  , recommend 2 week therapy.  Appreciate GI recommendations.   Code Status: Full Code Family Communication: Discussed with son Maria Christian via telephoneon 5/14 Disposition Plan: Back to SNF when ready.   Consultants:  GI    Antibiotics:  Flagyl 5/15  Subjective: Answers simple questions. Denies any new complaints.  Comfortable.   Objective: Filed Vitals:   10/18/13 0624 10/18/13 1500 10/18/13 2300 10/19/13 0500  BP: 146/77 150/76 146/72 145/72  Pulse: 82 85 89 76  Temp: 97.8 F (36.6 C) 98.2 F (36.8 C) 97.5 F (36.4 C) 97.9 F (36.6 C)  TempSrc: Oral Oral Oral Oral  Resp: 18 20 20 20   Height:      Weight:    75.5 kg (166 lb 7.2 oz)  SpO2: 98% 100% 100% 100%    Intake/Output Summary (Last 24 hours) at 10/19/13 1032 Last data filed at 10/19/13 0850  Gross per 24 hour  Intake   1540 ml  Output      0 ml  Net   1540 ml   Filed Weights   10/17/13 0355 10/18/13 0500 10/19/13 0500  Weight: 74.4 kg (164 lb 0.4 oz) 73.1 kg (161 lb 2.5 oz) 75.5 kg (166 lb 7.2 oz)    Exam:   General:  Awake, calm  Cardiovascular: RRR  Respiratory: CTA B  Abdomen: Soft, non-distended  Extremities: no C/C/E   Neurologic:  Non-focal  Data Reviewed: Basic Metabolic Panel:  Recent Labs Lab 10/15/13 0505 10/15/13 0507 10/16/13  1000 10/16/13 1005 10/17/13 1000 10/17/13 1330 10/18/13 0858 10/18/13 1950 10/19/13 0435  NA  --  147  --  145 145  --  144  --  143  K  --  3.0*  --  3.4* 2.7*  --  2.7* 3.1* 2.9*  CL  --  122*  --  115* 115*  --  114*  --  114*  CO2  --  16*  --  17* 18*  --  17*  --  16*  GLUCOSE  --  136*  --  165* 120*  --  125*  --  106*  BUN  --  6  --  4* 4*  --  4*  --  3*  CREATININE  --  0.99  --  0.98 0.94  --  0.95  --  0.94  CALCIUM  --  9.2  --  8.9 8.8  --  8.2*  --   8.3*  MG 1.5  --  1.5  --   --  1.5 1.9  --  1.8   Liver Function Tests: No results found for this basename: AST, ALT, ALKPHOS, BILITOT, PROT, ALBUMIN,  in the last 168 hours No results found for this basename: LIPASE, AMYLASE,  in the last 168 hours No results found for this basename: AMMONIA,  in the last 168 hours CBC:  Recent Labs Lab 10/14/13 0445 10/17/13 1000 10/19/13 0435  WBC 5.4 5.6 5.2  NEUTROABS  --  3.8  --   HGB 7.4* 8.2* 8.1*  HCT 21.7* 23.8* 23.6*  MCV 97.3 98.3 100.4*  PLT 157 177 173   Cardiac Enzymes: No results found for this basename: CKTOTAL, CKMB, CKMBINDEX, TROPONINI,  in the last 168 hours BNP (last 3 results)  Recent Labs  05/24/13 0250  PROBNP 3915.0*   CBG: No results found for this basename: GLUCAP,  in the last 168 hours  Recent Results (from the past 240 hour(s))  MRSA PCR SCREENING     Status: None   Collection Time    10/10/13  3:04 AM      Result Value Ref Range Status   MRSA by PCR NEGATIVE  NEGATIVE Final   Comment:            The GeneXpert MRSA Assay (FDA     approved for NASAL specimens     only), is one component of a     comprehensive MRSA colonization     surveillance program. It is not     intended to diagnose MRSA     infection nor to guide or     monitor treatment for     MRSA infections.  CLOSTRIDIUM DIFFICILE BY PCR     Status: Abnormal   Collection Time    10/17/13  7:30 AM      Result Value Ref Range Status   C difficile by pcr POSITIVE (*) NEGATIVE Final   Comment: CRITICAL RESULT CALLED TO, READ BACK BY AND VERIFIED WITH:     Maria Fritz RN 9:35 10/17/13 ([email or web address])     Performed at [facility]     Studies: No results found.  Scheduled Meds: . carbamide peroxide  5 drop Left Ear BID  . diclofenac sodium  2 g Topical QID  . dorzolamide  1 drop Both Eyes TID  . feeding supplement (RESOURCE BREEZE)  1 Container Oral BID BM  . heparin subcutaneous  5,000 Units Subcutaneous Q12H  . latanoprost  1 drop  Both Eyes  QHS  . metroNIDAZOLE  500 mg Oral 3 times per day  . potassium chloride  10 mEq Intravenous Q1 Hr x 2  . potassium chloride  40 mEq Oral Once  . potassium chloride  40 mEq Oral TID  . saccharomyces boulardii  250 mg Oral BID  . sodium chloride  3 mL Intravenous Q12H   Continuous Infusions: . sodium chloride 20 mL/hr at 10/16/13 2248    Principal Problem:   Bowel obstruction Active Problems:   Atrial fibrillation with controlled ventricular response   Diastolic CHF, chronic   Hyperlipemia   Diabetes mellitus   Dementia   Dehydration   Acute renal failure   Anemia   Malnutrition of moderate degree   Enteritis due to Clostridium difficile   Hypokalemia        Hosie Poisson  [facility] Pager [phone]  If 7PM-7AM, please contact night-coverage at [email or web address], password [facility] 10/19/2013, 10:32 AM  LOS: 10 days

## 2013-10-20 DIAGNOSIS — E860 Dehydration: Secondary | ICD-10-CM | POA: Diagnosis not present

## 2013-10-20 DIAGNOSIS — N179 Acute kidney failure, unspecified: Secondary | ICD-10-CM | POA: Diagnosis not present

## 2013-10-20 DIAGNOSIS — E876 Hypokalemia: Secondary | ICD-10-CM | POA: Diagnosis not present

## 2013-10-20 LAB — BASIC METABOLIC PANEL
BUN: 3 mg/dL — AB (ref 6–23)
CALCIUM: 8.4 mg/dL (ref 8.4–10.5)
CO2: 17 mEq/L — ABNORMAL LOW (ref 19–32)
CREATININE: 0.92 mg/dL (ref 0.50–1.10)
Chloride: 115 mEq/L — ABNORMAL HIGH (ref 96–112)
GFR calc non Af Amer: 54 mL/min — ABNORMAL LOW (ref >90)
GFR, EST AFRICAN AMERICAN: 63 mL/min — AB (ref >90)
Glucose, Bld: 134 mg/dL — ABNORMAL HIGH (ref 70–99)
Potassium: 3.1 mEq/L — ABNORMAL LOW (ref 3.7–5.3)
Sodium: 143 mEq/L (ref 137–147)

## 2013-10-20 LAB — MAGNESIUM: Magnesium: 1.7 mg/dL (ref 1.5–2.5)

## 2013-10-20 MED ORDER — POTASSIUM CHLORIDE 10 MEQ/100ML IV SOLN
10.0000 meq | INTRAVENOUS | Status: AC
  Administered 2013-10-20 (×2): 10 meq via INTRAVENOUS
  Filled 2013-10-20 (×2): qty 100

## 2013-10-20 MED ORDER — POTASSIUM CHLORIDE CRYS ER 20 MEQ PO TBCR
40.0000 meq | EXTENDED_RELEASE_TABLET | Freq: Once | ORAL | Status: AC
  Administered 2013-10-20: 40 meq via ORAL
  Filled 2013-10-20: qty 2

## 2013-10-20 NOTE — Progress Notes (Signed)
[facility] PROGRESS NOTE  Maria Christian [id number]:[id number] DOB: [birth year] DOA: 10/09/2013 PCP: Ezequiel Kayser, MD Brief HPI: Maria Christian is a 80 y.o. female, with history of mild dementia, history of recurrent volvulus status post flexible sigmoidoscopy in December of last year, he presents with complaints of abdominal pain, nausea, vomiting and watery diarrhea for the last 24 hours, patient is confused and lethargic, can't give any reliable history, she is daughter and sister at bedside to give history, they report patient had similar episode a few months ago where she was diagnosed with low levels, patient has been having watery small amount bowel movements, patient was Hemoccult negative, she is afebrile, complains of mild abdominal pain, patient hemoglobin is 7.8, she is Hemoccult negative, currently denies any coffee-ground emesis, as well patient workup was significant for mild acute renal failure, hypokalemia, and hypernatremia. Patient abdominal x-ray did show evidence of ileus versus bowel obstruction. CT abdomen and pelvis showed mulitple small bowel and colonic air fluid levels with gaseous distention of the colon. She was put on clears and we have advanced her diet as tolerated. GI consulted. Meanwhile stool for c diff came backp ositive and we have started her on po flagyl. Recommend 2 weeks of treatment.   Assessment/Plan: IleUS: -improved.  -NG Tube is out and she is tolerating liquid diet, and we have advanced to soft diet -Will continue to advance diet as tolerated. -Correct electrolyte abnormalities    Hypokalemia -Continue to replete PO and IV. -Mg ok at 1.5, replete with 2 gms of mag sulfate.  - repeat K is still at 3.1. IV k and po potassium ordered.   Hypernatremia -Likely related to free water deficit from decreased PO intake. -Resolved with dextrose fluids. Will d/c the fluids and watch her electrolytes.   ARF -RESOLVED. -Likely 2/2 prerenal azotemia  and dehydration.  Anemia - No indication for acute transfusion.  Chronic Diastolic CHF -Compensated at present.   C diff diarrhea: Started on po flagyl  , recommend 2 week therapy.  Appreciate GI recommendations.   Code Status: Full Code Family Communication: Discussed with son Maria Christian via telephoneon 5/14 Disposition Plan: Back to SNF when ready.   Consultants:  GI    Antibiotics:  Flagyl 5/15  Subjective: Answers simple questions. Denies any new complaints.  Comfortable.   Objective: Filed Vitals:   10/19/13 0500 10/19/13 1316 10/19/13 2240 10/20/13 0647  BP: 145/72 155/82 150/64 153/71  Pulse: 76 91 81 84  Temp: 97.9 F (36.6 C) 98.1 F (36.7 C) 98.3 F (36.8 C) 98.8 F (37.1 C)  TempSrc: Oral Oral Oral Oral  Resp: 20 20 20 20   Height:      Weight: 75.5 kg (166 lb 7.2 oz)   74.5 kg (164 lb 3.9 oz)  SpO2: 100% 100% 98% 99%    Intake/Output Summary (Last 24 hours) at 10/20/13 1010 Last data filed at 10/20/13 0700  Gross per 24 hour  Intake   1420 ml  Output    175 ml  Net   1245 ml   Filed Weights   10/18/13 0500 10/19/13 0500 10/20/13 0647  Weight: 73.1 kg (161 lb 2.5 oz) 75.5 kg (166 lb 7.2 oz) 74.5 kg (164 lb 3.9 oz)    Exam:   General:  Awake, calm  Cardiovascular: RRR  Respiratory: CTA B  Abdomen: Soft, non-distended  Extremities: no C/C/E   Neurologic:  Non-focal  Data Reviewed: Basic Metabolic Panel:  Recent Labs Lab 10/16/13 1000 10/16/13 1005 10/17/13 1000  10/17/13 1330 10/18/13 0858 10/18/13 1950 10/19/13 0435 10/19/13 1804 10/20/13 0345  NA  --  145 145  --  144  --  143  --  143  K  --  3.4* 2.7*  --  2.7* 3.1* 2.9* 3.2* 3.1*  CL  --  115* 115*  --  114*  --  114*  --  115*  CO2  --  17* 18*  --  17*  --  16*  --  17*  GLUCOSE  --  165* 120*  --  125*  --  106*  --  134*  BUN  --  4* 4*  --  4*  --  3*  --  3*  CREATININE  --  0.98 0.94  --  0.95  --  0.94  --  0.92  CALCIUM  --  8.9 8.8  --  8.2*  --  8.3*   --  8.4  MG 1.5  --   --  1.5 1.9  --  1.8  --  1.7   Liver Function Tests: No results found for this basename: AST, ALT, ALKPHOS, BILITOT, PROT, ALBUMIN,  in the last 168 hours No results found for this basename: LIPASE, AMYLASE,  in the last 168 hours No results found for this basename: AMMONIA,  in the last 168 hours CBC:  Recent Labs Lab 10/14/13 0445 10/17/13 1000 10/19/13 0435  WBC 5.4 5.6 5.2  NEUTROABS  --  3.8  --   HGB 7.4* 8.2* 8.1*  HCT 21.7* 23.8* 23.6*  MCV 97.3 98.3 100.4*  PLT 157 177 173   Cardiac Enzymes: No results found for this basename: CKTOTAL, CKMB, CKMBINDEX, TROPONINI,  in the last 168 hours BNP (last 3 results)  Recent Labs  05/24/13 0250  PROBNP 3915.0*   CBG: No results found for this basename: GLUCAP,  in the last 168 hours  Recent Results (from the past 240 hour(s))  CLOSTRIDIUM DIFFICILE BY PCR     Status: Abnormal   Collection Time    10/17/13  7:30 AM      Result Value Ref Range Status   C difficile by pcr POSITIVE (*) NEGATIVE Final   Comment: CRITICAL RESULT CALLED TO, READ BACK BY AND VERIFIED WITH:     Elane Fritz RN 9:35 10/17/13 ([email or web address])     Performed at [facility]     Studies: No results found.  Scheduled Meds: . carbamide peroxide  5 drop Left Ear BID  . diclofenac sodium  2 g Topical QID  . dorzolamide  1 drop Both Eyes TID  . feeding supplement (RESOURCE BREEZE)  1 Container Oral BID BM  . heparin subcutaneous  5,000 Units Subcutaneous Q12H  . latanoprost  1 drop Both Eyes QHS  . metroNIDAZOLE  500 mg Oral 3 times per day  . potassium chloride  10 mEq Intravenous Q1 Hr x 2  . potassium chloride  40 mEq Oral Once  . saccharomyces boulardii  250 mg Oral BID  . sodium chloride  3 mL Intravenous Q12H   Continuous Infusions: . sodium chloride 20 mL/hr at 10/16/13 2248    Principal Problem:   Bowel obstruction Active Problems:   Atrial fibrillation with controlled ventricular response   Diastolic CHF,  chronic   Hyperlipemia   Diabetes mellitus   Dementia   Dehydration   Acute renal failure   Anemia   Malnutrition of moderate degree   Enteritis due to Clostridium difficile   Hypokalemia  Hosie Poisson  [facility] Pager [phone]  If 7PM-7AM, please contact night-coverage at [email or web address], password [facility] 10/20/2013, 10:10 AM  LOS: 11 days

## 2013-10-21 DIAGNOSIS — N179 Acute kidney failure, unspecified: Secondary | ICD-10-CM | POA: Diagnosis not present

## 2013-10-21 DIAGNOSIS — E876 Hypokalemia: Secondary | ICD-10-CM | POA: Diagnosis not present

## 2013-10-21 DIAGNOSIS — E860 Dehydration: Secondary | ICD-10-CM | POA: Diagnosis not present

## 2013-10-21 LAB — BASIC METABOLIC PANEL
BUN: <3 mg/dL — ABNORMAL LOW (ref 6–23)
CALCIUM: 8.4 mg/dL (ref 8.4–10.5)
CHLORIDE: 115 meq/L — AB (ref 96–112)
CO2: 17 mEq/L — ABNORMAL LOW (ref 19–32)
CREATININE: 0.93 mg/dL (ref 0.50–1.10)
GFR calc non Af Amer: 54 mL/min — ABNORMAL LOW (ref >90)
GFR, EST AFRICAN AMERICAN: 62 mL/min — AB (ref >90)
Glucose, Bld: 124 mg/dL — ABNORMAL HIGH (ref 70–99)
Potassium: 2.8 mEq/L — CL (ref 3.7–5.3)
Sodium: 143 mEq/L (ref 137–147)

## 2013-10-21 LAB — MAGNESIUM: Magnesium: 1.6 mg/dL (ref 1.5–2.5)

## 2013-10-21 LAB — POTASSIUM: Potassium: 3.3 mEq/L — ABNORMAL LOW (ref 3.7–5.3)

## 2013-10-21 MED ORDER — POTASSIUM CHLORIDE CRYS ER 20 MEQ PO TBCR
40.0000 meq | EXTENDED_RELEASE_TABLET | Freq: Three times a day (TID) | ORAL | Status: DC
  Administered 2013-10-21: 40 meq via ORAL
  Filled 2013-10-21 (×3): qty 2

## 2013-10-21 MED ORDER — POTASSIUM CHLORIDE 10 MEQ/100ML IV SOLN
10.0000 meq | INTRAVENOUS | Status: AC
  Administered 2013-10-21 (×4): 10 meq via INTRAVENOUS
  Filled 2013-10-21 (×4): qty 100

## 2013-10-21 MED ORDER — SERTRALINE HCL 50 MG PO TABS
50.0000 mg | ORAL_TABLET | Freq: Every day | ORAL | Status: DC
  Administered 2013-10-21 – 2013-10-23 (×3): 50 mg via ORAL
  Filled 2013-10-21 (×3): qty 1

## 2013-10-21 MED ORDER — POTASSIUM CHLORIDE CRYS ER 20 MEQ PO TBCR
40.0000 meq | EXTENDED_RELEASE_TABLET | Freq: Three times a day (TID) | ORAL | Status: DC
  Administered 2013-10-21 (×2): 40 meq via ORAL
  Filled 2013-10-21 (×3): qty 2

## 2013-10-21 MED ORDER — CLOPIDOGREL BISULFATE 75 MG PO TABS
75.0000 mg | ORAL_TABLET | Freq: Every day | ORAL | Status: DC
  Administered 2013-10-21 – 2013-10-23 (×3): 75 mg via ORAL
  Filled 2013-10-21 (×3): qty 1

## 2013-10-21 MED ORDER — GABAPENTIN 300 MG PO CAPS
300.0000 mg | ORAL_CAPSULE | Freq: Two times a day (BID) | ORAL | Status: DC
  Administered 2013-10-21 – 2013-10-23 (×5): 300 mg via ORAL
  Filled 2013-10-21 (×6): qty 1

## 2013-10-21 MED ORDER — KETOTIFEN FUMARATE 0.025 % OP SOLN
1.0000 [drp] | Freq: Two times a day (BID) | OPHTHALMIC | Status: DC
  Administered 2013-10-21 – 2013-10-23 (×5): 1 [drp] via OPHTHALMIC
  Filled 2013-10-21: qty 5

## 2013-10-21 MED ORDER — AMLODIPINE BESYLATE 5 MG PO TABS
5.0000 mg | ORAL_TABLET | Freq: Every day | ORAL | Status: DC
  Administered 2013-10-21 – 2013-10-23 (×3): 5 mg via ORAL
  Filled 2013-10-21 (×3): qty 1

## 2013-10-21 MED ORDER — DONEPEZIL HCL 5 MG PO TABS
5.0000 mg | ORAL_TABLET | Freq: Every day | ORAL | Status: DC
  Administered 2013-10-21 – 2013-10-22 (×2): 5 mg via ORAL
  Filled 2013-10-21 (×3): qty 1

## 2013-10-21 MED ORDER — CARVEDILOL 3.125 MG PO TABS
3.1250 mg | ORAL_TABLET | Freq: Every day | ORAL | Status: DC
  Administered 2013-10-21 – 2013-10-23 (×3): 3.125 mg via ORAL
  Filled 2013-10-21 (×3): qty 1

## 2013-10-21 NOTE — Progress Notes (Signed)
CRITICAL VALUE ALERT  Critical value received:  K+ 2.8  Date of notification:  10/21/13  Time of notification:  04:30  Critical value read back:yes  Nurse who received alert:  Ruben Im, RN  MD notified (1st page):  Tylene Fantasia  Time of first page:    MD notified (2nd page):  Time of second page:  Responding MD:  Tylene Fantasia  Time MD responded:  [phone]

## 2013-10-21 NOTE — Progress Notes (Signed)
[facility] PROGRESS NOTE  Maria Christian DQQ:[id number] DOB: Mar 23, 1925 DOA: 10/09/2013 PCP: Ezequiel Kayser, MD Brief HPI: Maria Christian is a 80 y.o. female, with history of mild dementia, history of recurrent volvulus status post flexible sigmoidoscopy in December of last year, he presents with complaints of abdominal pain, nausea, vomiting and watery diarrhea for the last 24 hours, patient is confused and lethargic, can't give any reliable history, she is daughter and sister at bedside to give history, they report patient had similar episode a few months ago where she was diagnosed with low levels, patient has been having watery small amount bowel movements, patient was Hemoccult negative, she is afebrile, complains of mild abdominal pain, patient hemoglobin is 7.8, she is Hemoccult negative, currently denies any coffee-ground emesis, as well patient workup was significant for mild acute renal failure, hypokalemia, and hypernatremia. Patient abdominal x-ray did show evidence of ileus versus bowel obstruction. CT abdomen and pelvis showed mulitple small bowel and colonic air fluid levels with gaseous distention of the colon. She was put on clears and we have advanced her diet as tolerated. GI consulted. Meanwhile stool for c diff came backp ositive and we have started her on po flagyl. Recommend 2 weeks of treatment.   Assessment/Plan: IleUS: -improved.  -NG Tube is out and she is tolerating liquid diet, and we have advanced to soft diet -Will continue to advance diet as tolerated. -Correct electrolyte abnormalities    Hypokalemia -Continue to replete PO and IV. -Mg ok at 1.5, replete with 2 gms of mag sulfate.  - repeat K is still at 3.1. IV k and po potassium ordered.   Hypernatremia -Likely related to free water deficit from decreased PO intake. -Resolved with dextrose fluids. Will d/c the fluids and watch her electrolytes.   ARF -RESOLVED. -Likely 2/2 prerenal azotemia  and dehydration.  Anemia - No indication for acute transfusion.  Chronic Diastolic CHF -Compensated at present.   C diff diarrhea: Started on po flagyl  , recommend 2 week therapy.  Appreciate GI recommendations.   Code Status: Full Code Family Communication: Discussed with son [name] via telephoneon 5/14 and spoke with her daughter on 5/19 Disposition Plan: Back to SNF when ready.   Consultants:  GI    Antibiotics:  Flagyl 5/15  Subjective: Answers simple questions. Denies any new complaints.  Comfortable.   Objective: Filed Vitals:   10/20/13 1343 10/20/13 2119 10/21/13 0600 10/21/13 1433  BP: 154/70 154/74 151/77 150/75  Pulse: 90 79 101 87  Temp: 98.5 F (36.9 C) 98.7 F (37.1 C) 98 F (36.7 C) 97.4 F (36.3 C)  TempSrc: Oral Oral Oral Oral  Resp: 20 20 20 20   Height:      Weight:   73.5 kg (162 lb 0.6 oz)   SpO2: 100% 100% 98% 98%    Intake/Output Summary (Last 24 hours) at 10/21/13 1800 Last data filed at 10/21/13 1100  Gross per 24 hour  Intake    360 ml  Output      0 ml  Net    360 ml   Filed Weights   10/19/13 0500 10/20/13 0647 10/21/13 0600  Weight: 75.5 kg (166 lb 7.2 oz) 74.5 kg (164 lb 3.9 oz) 73.5 kg (162 lb 0.6 oz)    Exam:   General:  Awake, calm  Cardiovascular: RRR  Respiratory: CTA B  Abdomen: Soft, non-distended  Extremities: no C/C/E   Neurologic:  Non-focal  Data Reviewed: Basic Metabolic Panel:  Recent Labs Lab  10/17/13 1000 10/17/13 1330 10/18/13 0858  10/19/13 0435 10/19/13 1804 10/20/13 0345 10/21/13 0335 10/21/13 1604  NA 145  --  144  --  143  --  143 143  --   K 2.7*  --  2.7*  < > 2.9* 3.2* 3.1* 2.8* 3.3*  CL 115*  --  114*  --  114*  --  115* 115*  --   CO2 18*  --  17*  --  16*  --  17* 17*  --   GLUCOSE 120*  --  125*  --  106*  --  134* 124*  --   BUN 4*  --  4*  --  3*  --  3* <3*  --   CREATININE 0.94  --  0.95  --  0.94  --  0.92 0.93  --   CALCIUM 8.8  --  8.2*  --  8.3*  --  8.4 8.4   --   MG  --  1.5 1.9  --  1.8  --  1.7 1.6  --   < > = values in this interval not displayed. Liver Function Tests: No results found for this basename: AST, ALT, ALKPHOS, BILITOT, PROT, ALBUMIN,  in the last 168 hours No results found for this basename: LIPASE, AMYLASE,  in the last 168 hours No results found for this basename: AMMONIA,  in the last 168 hours CBC:  Recent Labs Lab 10/17/13 1000 10/19/13 0435  WBC 5.6 5.2  NEUTROABS 3.8  --   HGB 8.2* 8.1*  HCT 23.8* 23.6*  MCV 98.3 100.4*  PLT 177 173   Cardiac Enzymes: No results found for this basename: CKTOTAL, CKMB, CKMBINDEX, TROPONINI,  in the last 168 hours BNP (last 3 results)  Recent Labs  05/24/13 0250  PROBNP 3915.0*   CBG: No results found for this basename: GLUCAP,  in the last 168 hours  Recent Results (from the past 240 hour(s))  CLOSTRIDIUM DIFFICILE BY PCR     Status: Abnormal   Collection Time    10/17/13  7:30 AM      Result Value Ref Range Status   C difficile by pcr POSITIVE (*) NEGATIVE Final   Comment: CRITICAL RESULT CALLED TO, READ BACK BY AND VERIFIED WITH:     Elane Fritz RN 9:35 10/17/13 ([email or web address])     Performed at [facility]     Studies: No results found.  Scheduled Meds: . amLODipine  5 mg Oral Daily  . carbamide peroxide  5 drop Left Ear BID  . carvedilol  3.125 mg Oral Daily  . clopidogrel  75 mg Oral Daily  . donepezil  5 mg Oral QHS  . dorzolamide  1 drop Both Eyes TID  . feeding supplement (RESOURCE BREEZE)  1 Container Oral BID BM  . gabapentin  300 mg Oral BID  . heparin subcutaneous  5,000 Units Subcutaneous Q12H  . ketotifen  1 drop Both Eyes BID  . latanoprost  1 drop Both Eyes QHS  . metroNIDAZOLE  500 mg Oral 3 times per day  . potassium chloride  40 mEq Oral Once  . potassium chloride  40 mEq Oral TID  . saccharomyces boulardii  250 mg Oral BID  . sertraline  50 mg Oral Daily  . sodium chloride  3 mL Intravenous Q12H   Continuous Infusions: . sodium  chloride 20 mL/hr at 10/16/13 2248    Principal Problem:   Bowel obstruction Active Problems:   Atrial fibrillation  with controlled ventricular response   Diastolic CHF, chronic   Hyperlipemia   Diabetes mellitus   Dementia   Dehydration   Acute renal failure   Anemia   Malnutrition of moderate degree   Enteritis due to Clostridium difficile   Hypokalemia        Hosie Poisson  [facility] Pager [phone]  If 7PM-7AM, please contact night-coverage at [email or web address], password [facility] 10/21/2013, 6:00 PM  LOS: 12 days

## 2013-10-21 NOTE — Discharge Summary (Signed)
Physician Discharge Summary  SHALAN NEAULT ZOX:[id number] DOB: Jul 10, 1924 DOA: 10/09/2013  PCP: Ezequiel Kayser, MD  Admit date: 10/09/2013 Discharge date:   Time spent: 30 minutes  Recommendations for Outpatient Follow-up:  1. You will be discharged to SNF when potassium is stable.  2. Follow up with BMP IN 2 DAYS.  3. Follow up with PCP in 2 week. s  Discharge Diagnoses:  Principal Problem:   Bowel obstruction Active Problems:   Atrial fibrillation with controlled ventricular response   Diastolic CHF, chronic   Hyperlipemia   Diabetes mellitus   Dementia   Dehydration   Acute renal failure   Anemia   Malnutrition of moderate degree   Enteritis due to Clostridium difficile   Hypokalemia   Discharge Condition: improved.   Diet recommendation: regular   Filed Weights   10/19/13 0500 10/20/13 0647 10/21/13 0600  Weight: 75.5 kg (166 lb 7.2 oz) 74.5 kg (164 lb 3.9 oz) 73.5 kg (162 lb 0.6 oz)    History of present illness:  Maria Christian is a 80 y.o. female, with history of mild dementia, history of recurrent volvulus status post flexible sigmoidoscopy in December of last year, he presents with complaints of abdominal pain, nausea, vomiting and watery diarrhea for the last 24 hours, patient is confused and lethargic, can't give any reliable history, she is daughter and sister at bedside to give history, they report patient had similar episode a few months ago where she was diagnosed with low levels, patient has been having watery small amount bowel movements, patient was Hemoccult negative, she is afebrile, complains of mild abdominal pain, patient hemoglobin is 7.8, she is Hemoccult negative, currently denies any coffee-ground emesis, as well patient workup was significant for mild acute renal failure, hypokalemia, and hypernatremia. Patient abdominal x-ray did show evidence of ileus versus bowel obstruction. CT abdomen and pelvis showed mulitple small bowel and colonic air fluid  levels with gaseous distention of the colon. She was put on clears and we have advanced her diet as tolerated. GI consulted. Meanwhile stool for c diff came backp ositive and we have started her on po flagyl. Recommend 2 weeks of treatment.   Hospital Course:  c diff diarrhea;  Improving. On flagyl to complete 14 day course.   Anemia: Stable.   ARF: resolved with fluids. Secondary to prerenal azotemia and dehydration.   Hypernatremia:  Free water deficit. Resolved with dextrose fluids.   Hypokalemia and hypomagnesemia: - is being repleted as indicated.   Ileus: Resolved.    Procedures:  none  Consultations:  Gi   Discharge Exam: Filed Vitals:   10/21/13 1433  BP: 150/75  Pulse: 87  Temp: 97.4 F (36.3 C)  Resp: 20    General: ALERT AFEBRILE comfortable Cardiovascular: s1s2 Respiratory: ctab  Discharge Instructions You were cared for by a hospitalist during your hospital stay. If you have any questions about your discharge medications or the care you received while you were in the hospital after you are discharged, you can call the unit and asked to speak with the hospitalist on call if the hospitalist that took care of you is not available. Once you are discharged, your primary care physician will handle any further medical issues. Please note that NO REFILLS for any discharge medications will be authorized once you are discharged, as it is imperative that you return to your primary care physician (or establish a relationship with a primary care physician if you do not have one) for your aftercare needs so  that they can reassess your need for medications and monitor your lab values.     Medication List    ASK your doctor about these medications       acetaminophen 500 MG tablet  Commonly known as:  TYLENOL  Take 1,000 mg by mouth 3 (three) times daily.     ALPRAZolam 0.25 MG tablet  Commonly known as:  XANAX  Take one tablet by mouth once daily at 2pm; Take  one tablet by mouth at bedtime as needed; Take one tablet by mouth every 12 hours as needed     amLODipine 5 MG tablet  Commonly known as:  NORVASC  Take 5 mg by mouth daily.     aspirin 81 MG chewable tablet  Chew 81 mg by mouth daily.     carvedilol 3.125 MG tablet  Commonly known as:  COREG  Take 3.125 mg by mouth daily.     CERTAGEN PO  Take 1 tablet by mouth daily.     cholecalciferol 1000 UNITS tablet  Commonly known as:  VITAMIN D  Take 1,000 Units by mouth daily.     clopidogrel 75 MG tablet  Commonly known as:  PLAVIX  Take 75 mg by mouth daily.     Cranberry 475 MG Caps  Take 1 capsule by mouth 2 (two) times daily.     cyclobenzaprine 5 MG tablet  Commonly known as:  FLEXERIL  Take 5 mg by mouth at bedtime.     dextromethorphan-guaiFENesin 30-600 MG per 12 hr tablet  Commonly known as:  MUCINEX DM  Take 1 tablet by mouth 2 (two) times daily as needed for cough.     donepezil 5 MG tablet  Commonly known as:  ARICEPT  Take 5 mg by mouth at bedtime.     dorzolamide 2 % ophthalmic solution  Commonly known as:  TRUSOPT  Place 1 drop into both eyes 3 (three) times daily.     ferrous sulfate 325 (65 FE) MG tablet  Take 325 mg by mouth daily with breakfast.     furosemide 40 MG tablet  Commonly known as:  LASIX  Take 40 mg by mouth daily.     gabapentin 300 MG capsule  Commonly known as:  NEURONTIN  Take 300 mg by mouth 2 (two) times daily.     hydrALAZINE 10 MG tablet  Commonly known as:  APRESOLINE  Take 10 mg by mouth 3 (three) times daily. HOLD if SBP <100/60 and pulse is less than 60     ibuprofen 400 MG tablet  Commonly known as:  ADVIL,MOTRIN  Take 400 mg by mouth every 6 (six) hours as needed for moderate pain.     ketotifen 0.025 % ophthalmic solution  Commonly known as:  ZADITOR  Place 1 drop into both eyes 2 (two) times daily.     lactulose 10 GM/15ML solution  Commonly known as:  CHRONULAC  Take 30 g by mouth 2 (two) times daily.      lidocaine 5 %  Commonly known as:  LIDODERM  Place 1 patch onto the skin daily. Apply 1 patch to left lower back/hip every morning.  Remove & Discard patch within 12 hours or as directed by MD     loratadine 10 MG tablet  Commonly known as:  CLARITIN  Take 10 mg by mouth daily.     metFORMIN 1000 MG tablet  Commonly known as:  GLUCOPHAGE  Take 1,000 mg by mouth 2 (two) times daily with  a meal.     ondansetron 4 MG tablet  Commonly known as:  ZOFRAN  Take 4 mg by mouth every 8 (eight) hours as needed for nausea or vomiting.     pantoprazole 40 MG tablet  Commonly known as:  PROTONIX  Take 40 mg by mouth 2 (two) times daily.     polyethylene glycol packet  Commonly known as:  MIRALAX / GLYCOLAX  Take 17 g by mouth daily.     potassium chloride SA 20 MEQ tablet  Commonly known as:  K-DUR,KLOR-CON  Take 40 mEq by mouth daily.     pravastatin 20 MG tablet  Commonly known as:  PRAVACHOL  Take 20 mg by mouth at bedtime.     sertraline 50 MG tablet  Commonly known as:  ZOLOFT  Take 50 mg by mouth daily.     sodium bicarbonate 650 MG tablet  Take 650 mg by mouth 2 (two) times daily.     SYSTANE OP  Place 1 drop into both eyes 4 (four) times daily.     Travoprost (BAK Free) 0.004 % Soln ophthalmic solution  Commonly known as:  TRAVATAN  Place 2 drops into both eyes at bedtime.     traZODone 50 MG tablet  Commonly known as:  DESYREL  Take 100 mg by mouth at bedtime.     vitamin B-12 500 MCG tablet  Commonly known as:  CYANOCOBALAMIN  Take 500 mcg by mouth daily.     vitamin C 500 MG tablet  Commonly known as:  ASCORBIC ACID  Take 500 mg by mouth daily.     VOLTAREN 1 % Gel  Generic drug:  diclofenac sodium  Apply 2 g topically 2 (two) times daily as needed (left neck pain).       Allergies  Allergen Reactions  . Aspirin Other (See Comments)    G.I. Upset only      The results of significant diagnostics from this hospitalization (including imaging,  microbiology, ancillary and laboratory) are listed below for reference.    Significant Diagnostic Studies: Dg Abd 1 View  10/14/2013   CLINICAL DATA:  Distension.  Follow-up pseudo obstruction.  EXAM: ABDOMEN - 1 VIEW  COMPARISON:  10/13/2013.  FINDINGS: Gas distended colon with superior displacement of the sigmoid colon. Sigmoid colon measures up to 11.6 cm versus prior 13.5 cm.  The possibility of free intraperitoneal air cannot be assessed on a supine view.  Calcified tortuous aorta.  Post left hip replacement.  IMPRESSION: Gas distended colon with superior displacement of the sigmoid colon. Sigmoid colon measures up to 11.6 cm versus prior 13.5 cm.   Electronically Signed   By: Chauncey Cruel M.D.   On: 10/14/2013 10:39   Ct Abdomen Pelvis W Contrast  10/09/2013   CLINICAL DATA:  Diarrhea, vomiting, abdominal pain  EXAM: CT ABDOMEN AND PELVIS WITH CONTRAST  TECHNIQUE: Multidetector CT imaging of the abdomen and pelvis was performed using the standard protocol following bolus administration of intravenous contrast.  CONTRAST:  62mL OMNIPAQUE IOHEXOL 300 MG/ML SOLN, 63mL OMNIPAQUE IOHEXOL 300 MG/ML SOLN  COMPARISON:  DG ABD ACUTE W/CHEST dated 10/09/2013; CT ABD/PELV WO CM dated 07/24/2013  FINDINGS: There are bilateral small pleural effusions, right greater than left with bibasilar atelectasis. There is stable cardiomegaly.  The liver demonstrates no focal abnormality. There is no intrahepatic or extrahepatic biliary ductal dilatation. The gallbladder is surgically absent. The spleen demonstrates no focal abnormality.There is a horseshoe kidney with left nephrolithiasis. There is no  obstructive uropathy. The adrenal glands and pancreas are normal. The bladder is unremarkable.  There is gaseous distension of the colon with multiple colonic air-fluid levels. There is no significant small bowel dilatation. There are multiple small bowel air-fluid levels. There is no pneumoperitoneum, pneumatosis, or portal venous  gas. There is no abdominal or pelvic free fluid. There is no lymphadenopathy.  The abdominal aorta is normal in caliber with atherosclerosis.  There are no lytic or sclerotic osseous lesions. There is mild lumbar spine spondylosis.  IMPRESSION: 1. Multiple small bowel and colonic air-fluid levels with nonspecific gaseous distention of the colon. This appearance can be seen with an ileus versus better colitis.  2.  Small bilateral pleural effusions and bibasilar atelectasis.  3.  Horseshoe kidney.  Left nephrolithiasis.   Electronically Signed   By: Elige Ko   On: 10/09/2013 23:57   Dg Chest Port 1 View  10/13/2013   CLINICAL DATA:  Abdominal distention and pain.  EXAM: PORTABLE CHEST - 1 VIEW  COMPARISON:  DG ABD ACUTE W/CHEST dated 10/09/2013  FINDINGS: Cardiac enlargement with mild pulmonary vascular congestion. No edema or consolidation. No blunting of costophrenic angles. No pneumothorax.  IMPRESSION: Cardiac enlargement with mild pulmonary vascular congestion.   Electronically Signed   By: Burman Nieves M.D.   On: 10/13/2013 04:10   Dg Abd Acute W/chest  10/09/2013   CLINICAL DATA:  Abdominal pain, emesis and weakness.  EXAM: ACUTE ABDOMEN SERIES (ABDOMEN 2 VIEW & CHEST 1 VIEW)  COMPARISON:  DG ABD PORTABLE 1V dated 07/27/2013; DG ABD ACUTE W/CHEST dated 07/24/2013  FINDINGS: Cardiac silhouette is moderately enlarged, mediastinal silhouette is tissues. Mild central pulmonary vasculature congestion interstitial prominence with patchy left lower lobe airspace opacity. No pleural effusions. No pneumothorax. Severely calcified thoracic aorta. Multiple EKG lines overlie the patient and may obscure subtle underlying pathology.  Diffuse colonic gaseous distention, sigmoid is 13 cm in transaxial dimension, previously 10 cm.  Surgical clips in the right upper quadrant. No intra-abdominal mass effect. Phleboliths in the pelvis. Status post left hip hemiarthroplasty.  IMPRESSION: Cardiomegaly, central pulmonary  vasculature congestion with patchy left lower lobe airspace opacity which may reflect atelectasis or even early pneumonia.  Worsening gaseous distention of the colon, though this may reflect ileus, distal large bowel obstruction may have a similar appearance.   Electronically Signed   By: Awilda Metro   On: 10/09/2013 20:27   Dg Abd Portable 2v  10/13/2013   CLINICAL DATA:  Abdominal distention and pain  EXAM: PORTABLE ABDOMEN - 2 VIEW  COMPARISON:  CT ABD/PELVIS W CM dated 10/09/2013; DG ABD ACUTE W/CHEST dated 10/09/2013  FINDINGS: Moderate diffuse gaseous distention of the colon. No definite small bowel distention. Appearance is similar to previous study and likely represents adynamic ileus. No free air is appreciated. Surgical clips in the right upper quadrant. Postoperative left hip hemiarthroplasty.  IMPRESSION: Persisting gaseous distention of the colon most likely representing adynamic ileus.   Electronically Signed   By: Burman Nieves M.D.   On: 10/13/2013 04:09    Microbiology: Recent Results (from the past 240 hour(s))  CLOSTRIDIUM DIFFICILE BY PCR     Status: Abnormal   Collection Time    10/17/13  7:30 AM      Result Value Ref Range Status   C difficile by pcr POSITIVE (*) NEGATIVE Final   Comment: CRITICAL RESULT CALLED TO, READ BACK BY AND VERIFIED WITH:     Gaynelle Arabian RN 9:35 10/17/13 ([email or web address])  Performed at [facility]: Basic Metabolic Panel:  Recent Labs Lab 10/17/13 1000 10/17/13 1330 10/18/13 0858  10/19/13 0435 10/19/13 1804 10/20/13 0345 10/21/13 0335 10/21/13 1604  NA 145  --  144  --  143  --  143 143  --   K 2.7*  --  2.7*  < > 2.9* 3.2* 3.1* 2.8* 3.3*  CL 115*  --  114*  --  114*  --  115* 115*  --   CO2 18*  --  17*  --  16*  --  17* 17*  --   GLUCOSE 120*  --  125*  --  106*  --  134* 124*  --   BUN 4*  --  4*  --  3*  --  3* <3*  --   CREATININE 0.94  --  0.95  --  0.94  --  0.92 0.93  --   CALCIUM 8.8  --  8.2*  --  8.3*  --   8.4 8.4  --   MG  --  1.5 1.9  --  1.8  --  1.7 1.6  --   < > = values in this interval not displayed. Liver Function Tests: No results found for this basename: AST, ALT, ALKPHOS, BILITOT, PROT, ALBUMIN,  in the last 168 hours No results found for this basename: LIPASE, AMYLASE,  in the last 168 hours No results found for this basename: AMMONIA,  in the last 168 hours CBC:  Recent Labs Lab 10/17/13 1000 10/19/13 0435  WBC 5.6 5.2  NEUTROABS 3.8  --   HGB 8.2* 8.1*  HCT 23.8* 23.6*  MCV 98.3 100.4*  PLT 177 173   Cardiac Enzymes: No results found for this basename: CKTOTAL, CKMB, CKMBINDEX, TROPONINI,  in the last 168 hours BNP: BNP (last 3 results)  Recent Labs  05/24/13 0250  PROBNP 3915.0*   CBG: No results found for this basename: GLUCAP,  in the last 168 hours     Signed:  Hosie Poisson  [facility] 10/21/2013, 5:57 PM

## 2013-10-21 NOTE — Progress Notes (Signed)
CSW continuing to follow for disposition planning.  Pt admitted from [facility] and plan is for pt to return once stable.  Per MD, pt likely medically ready for discharge. CSW contacted facility and updated facility.  CSW to continue to follow and facilitate pt discharge needs when pt medically ready for discharge.  Alison Murray, MSW, LCSW Clinical Social Work Coverage for [employer], [place]

## 2013-10-22 DIAGNOSIS — A0472 Enterocolitis due to Clostridium difficile, not specified as recurrent: Secondary | ICD-10-CM | POA: Diagnosis not present

## 2013-10-22 DIAGNOSIS — E876 Hypokalemia: Secondary | ICD-10-CM | POA: Diagnosis not present

## 2013-10-22 DIAGNOSIS — E440 Moderate protein-calorie malnutrition: Secondary | ICD-10-CM

## 2013-10-22 DIAGNOSIS — E860 Dehydration: Secondary | ICD-10-CM | POA: Diagnosis not present

## 2013-10-22 LAB — BASIC METABOLIC PANEL
BUN: <3 mg/dL — ABNORMAL LOW (ref 6–23)
CALCIUM: 8.4 mg/dL (ref 8.4–10.5)
CO2: 16 mEq/L — ABNORMAL LOW (ref 19–32)
CREATININE: 1 mg/dL (ref 0.50–1.10)
Chloride: 116 mEq/L — ABNORMAL HIGH (ref 96–112)
GFR calc Af Amer: 57 mL/min — ABNORMAL LOW (ref >90)
GFR, EST NON AFRICAN AMERICAN: 49 mL/min — AB (ref >90)
Glucose, Bld: 129 mg/dL — ABNORMAL HIGH (ref 70–99)
Potassium: 3.1 mEq/L — ABNORMAL LOW (ref 3.7–5.3)
Sodium: 144 mEq/L (ref 137–147)

## 2013-10-22 LAB — MAGNESIUM: MAGNESIUM: 1.6 mg/dL (ref 1.5–2.5)

## 2013-10-22 LAB — POTASSIUM: Potassium: 3.2 mEq/L — ABNORMAL LOW (ref 3.7–5.3)

## 2013-10-22 MED ORDER — POTASSIUM CHLORIDE CRYS ER 20 MEQ PO TBCR
40.0000 meq | EXTENDED_RELEASE_TABLET | Freq: Three times a day (TID) | ORAL | Status: DC
  Administered 2013-10-22 (×3): 40 meq via ORAL
  Filled 2013-10-22 (×6): qty 2

## 2013-10-22 MED ORDER — MAGNESIUM SULFATE 4000MG/100ML IJ SOLN
4.0000 g | Freq: Once | INTRAMUSCULAR | Status: AC
  Administered 2013-10-22: 4 g via INTRAVENOUS
  Filled 2013-10-22: qty 100

## 2013-10-22 NOTE — Discharge Summary (Addendum)
Physician Discharge Summary  Maria Christian [id number] DOB: 1924-10-10 DOA: 10/09/2013  PCP: Ezequiel Kayser, MD  Admit date: 10/09/2013 Discharge date:  10/23/13  Time spent: 30 minutes  Recommendations for Outpatient Follow-up:  1. Continue potassium 60 meq three times per day, but please check BMP on 5/22 and adjust potassium dose according to level please.  Previously had been on 2meq once daily.   2. Follow up with PCP in 2 weeks 3. Daily weights and if she gains more than 3-lbs in 1 day or 5-lbs in 7-days or if she develops swelling of the legs or shortness of breath, notify MD so that her lasix may be resumed.  Previously on 40mg  daily, but held due to hypokalemia and diarrhea 4. Continue flagyl through 5/28.  PPI and metformin stopped due to C. Diff infection.    Discharge Diagnoses:  Principal Problem:   Bowel obstruction Active Problems:   Atrial fibrillation with controlled ventricular response   Diastolic CHF, chronic   Hyperlipemia   Diabetes mellitus   Dementia   Dehydration   Acute renal failure   Anemia   Malnutrition of moderate degree   Enteritis due to Clostridium difficile   Hypokalemia   Discharge Condition: improved.   Diet recommendation: regular   Filed Weights   10/21/13 0600 10/22/13 0511 10/23/13 0500  Weight: 73.5 kg (162 lb 0.6 oz) 78.5 kg (173 lb 1 oz) 69.9 kg (154 lb 1.6 oz)    History of present illness:  Maria Christian is a 80 y.o. female, with history of mild dementia, history of recurrent volvulus status post flexible sigmoidoscopy in December of last year, he presents with complaints of abdominal pain, nausea, vomiting and watery diarrhea for the last 24 hours, patient is confused and lethargic, can't give any reliable history, she is daughter and sister at bedside to give history, they report patient had similar episode a few months ago where she was diagnosed with low levels, patient has been having watery small amount bowel movements,  patient was Hemoccult negative, she is afebrile, complains of mild abdominal pain, patient hemoglobin is 7.8, she is Hemoccult negative, currently denies any coffee-ground emesis, as well patient workup was significant for mild acute renal failure, hypokalemia, and hypernatremia. Patient abdominal x-ray did show evidence of ileus versus bowel obstruction. CT abdomen and pelvis showed mulitple small bowel and colonic air fluid levels with gaseous distention of the colon. She was put on clears and we have advanced her diet as tolerated. GI consulted. Meanwhile stool for c diff came backp ositive and we have started her on po flagyl. Recommend 2 weeks of treatment.   Hospital Course:   C diff diarrhea:  Initially admitted with abdominal distension and thought to have partial SBO vs. Ileus.  GI was consulted and she was placed on NG.  She was started on levofloxacin, however, her GI pathogen panel became positive for C. Diff and her antibiotics were changed to metronidazole.   Although she had some AKI which could be suggestive of severe C. Diff infection, this was felt to be due to dehydration.  She remained afebrile and did not have an elevated WBC count.  She was started on flagyl and should complete a 14-day course.  First day on 5/15, last day on 5/28.  She has been averaging about 3 stools per day for the last several days.      Macrocytic anemia, hemoglobin stable around 8mg /dl.  Recommend follow up with primary care doctor if anemia work up  is not already complete.    ARF, peak creatinine of 1.47, likely due to dehydration and resolved with fluids.   Hypernatremia, again likely due to dehydration and free water deficit while on tube feeds.  Resolved with D5W and liberalizaing diet.    Hypokalemia and hypomagnesemia, repleted with oral and IV potassium and IV magnesium.  This was likely due to GI losses from diarrhea.  Despite receiving potassium 16meq TID for several days, her potassium levels  remained around 3.1 to 3.3.  I have increased her dose of 39meq TID and and recommend repeating a potassium level on Friday with further determination of potassium dose based on this level.    Nongap metabolic acidosis with normal kidney function likely due to diarrhea and will correct gradually on its own.  Lactic acid was normal.    CAD, stable.  Continue plavix and BB  Chronic diastolic heart failure.  Held lasix due to dehydration, hypokalemia, and diarrhea.  Continue daily weights and strict I/O and if she develops swelling, or gains more than 3-lbs in 1 day or 5-lbs in 7 days, recommend resuming lasix as long as potassium levels are normalized.    A-fib, rate controlled.  Continue BB and aspirin.  Not on coumadin due to falls risk.   Parkinson's disease with dementia.  Continue donepezil.    DM, well controlled.  Last A1c 6.4.  Discontinued metformin to avoid worsening her GI symptoms.  Repeat A1c in 3 months.      Procedures:  none  Consultations:  Gi   Discharge Exam: Filed Vitals:   10/23/13 0440  BP: 139/62  Pulse: 61  Temp: 97.7 F (36.5 C)  Resp: 18    General:  CF, NAD HEENT:  NCAT, MMM Cardiovascular: normal s1s2 Respiratory: ctab ABD:  Hyperactive BS, soft, ND/NT MSK:  Trace LEE  Discharge Instructions You were cared for by a hospitalist during your hospital stay. If you have any questions about your discharge medications or the care you received while you were in the hospital after you are discharged, you can call the unit and asked to speak with the hospitalist on call if the hospitalist that took care of you is not available. Once you are discharged, your primary care physician will handle any further medical issues. Please note that NO REFILLS for any discharge medications will be authorized once you are discharged, as it is imperative that you return to your primary care physician (or establish a relationship with a primary care physician if you do not  have one) for your aftercare needs so that they can reassess your need for medications and monitor your lab values.      Discharge Instructions   (HEART FAILURE PATIENTS) Call MD:  Anytime you have any of the following symptoms: 1) 3 pound weight gain in 24 hours or 5 pounds in 1 week 2) shortness of breath, with or without a dry hacking cough 3) swelling in the hands, feet or stomach 4) if you have to sleep on extra pillows at night in order to breathe.    Complete by:  As directed      Call MD for:  difficulty breathing, headache or visual disturbances    Complete by:  As directed      Call MD for:  extreme fatigue    Complete by:  As directed      Call MD for:  hives    Complete by:  As directed      Call MD  for:  persistant dizziness or light-headedness    Complete by:  As directed      Call MD for:  persistant nausea and vomiting    Complete by:  As directed      Call MD for:  severe uncontrolled pain    Complete by:  As directed      Call MD for:  temperature >100.4    Complete by:  As directed      Diet general    Complete by:  As directed      Discharge instructions    Complete by:  As directed   Continue potassium 60 meq three times per day through 5/21, but please check BMP on 5/22 and adjust potassium dose according to level please.  Previously had been on 34meq once daily.   Follow up with PCP in 2 weeks Daily weights and if she gains more than 3-lbs in 1 day or 5-lbs in 7-days or if she develops swelling of the legs or shortness of breath, notify MD so that her lasix may be resumed.  Previously on 40mg  daily, but held due to hypokalemia and diarrhea Continue flagyl through 5/28.  PPI stopped due to C. Diff infection     Increase activity slowly    Complete by:  As directed             Medication List    STOP taking these medications       furosemide 40 MG tablet  Commonly known as:  LASIX     lactulose 10 GM/15ML solution  Commonly known as:  CHRONULAC      metFORMIN 1000 MG tablet  Commonly known as:  GLUCOPHAGE     pantoprazole 40 MG tablet  Commonly known as:  PROTONIX     polyethylene glycol packet  Commonly known as:  MIRALAX / GLYCOLAX      TAKE these medications       acetaminophen 500 MG tablet  Commonly known as:  TYLENOL  Take 1,000 mg by mouth 3 (three) times daily.     ALPRAZolam 0.25 MG tablet  Commonly known as:  XANAX  Take one tablet by mouth once daily at 2pm; Take one tablet by mouth at bedtime as needed; Take one tablet by mouth every 12 hours as needed     amLODipine 5 MG tablet  Commonly known as:  NORVASC  Take 5 mg by mouth daily.     aspirin 81 MG chewable tablet  Chew 81 mg by mouth daily.     carvedilol 3.125 MG tablet  Commonly known as:  COREG  Take 3.125 mg by mouth daily.     CERTAGEN PO  Take 1 tablet by mouth daily.     cholecalciferol 1000 UNITS tablet  Commonly known as:  VITAMIN D  Take 1,000 Units by mouth daily.     clopidogrel 75 MG tablet  Commonly known as:  PLAVIX  Take 75 mg by mouth daily.     Cranberry 475 MG Caps  Take 1 capsule by mouth 2 (two) times daily.     cyclobenzaprine 5 MG tablet  Commonly known as:  FLEXERIL  Take 5 mg by mouth at bedtime.     dextromethorphan-guaiFENesin 30-600 MG per 12 hr tablet  Commonly known as:  MUCINEX DM  Take 1 tablet by mouth 2 (two) times daily as needed for cough.     donepezil 5 MG tablet  Commonly known as:  ARICEPT  Take 5 mg by  mouth at bedtime.     dorzolamide 2 % ophthalmic solution  Commonly known as:  TRUSOPT  Place 1 drop into both eyes 3 (three) times daily.     ferrous sulfate 325 (65 FE) MG tablet  Take 325 mg by mouth daily with breakfast.     gabapentin 300 MG capsule  Commonly known as:  NEURONTIN  Take 300 mg by mouth 2 (two) times daily.     hydrALAZINE 10 MG tablet  Commonly known as:  APRESOLINE  Take 10 mg by mouth 3 (three) times daily. HOLD if SBP <100/60 and pulse is less than 60      ibuprofen 400 MG tablet  Commonly known as:  ADVIL,MOTRIN  Take 400 mg by mouth every 6 (six) hours as needed for moderate pain.     ketotifen 0.025 % ophthalmic solution  Commonly known as:  ZADITOR  Place 1 drop into both eyes 2 (two) times daily.     lidocaine 5 %  Commonly known as:  LIDODERM  Place 1 patch onto the skin daily. Apply 1 patch to left lower back/hip every morning.  Remove & Discard patch within 12 hours or as directed by MD     loratadine 10 MG tablet  Commonly known as:  CLARITIN  Take 10 mg by mouth daily.     metroNIDAZOLE 500 MG tablet  Commonly known as:  FLAGYL  Take 1 tablet (500 mg total) by mouth every 8 (eight) hours.     ondansetron 4 MG tablet  Commonly known as:  ZOFRAN  Take 4 mg by mouth every 8 (eight) hours as needed for nausea or vomiting.     potassium chloride SA 20 MEQ tablet  Commonly known as:  K-DUR,KLOR-CON  Take 3 tablets (60 mEq total) by mouth 3 (three) times daily.     pravastatin 20 MG tablet  Commonly known as:  PRAVACHOL  Take 20 mg by mouth at bedtime.     sertraline 50 MG tablet  Commonly known as:  ZOLOFT  Take 50 mg by mouth daily.     sodium bicarbonate 650 MG tablet  Take 650 mg by mouth 2 (two) times daily.     SYSTANE OP  Place 1 drop into both eyes 4 (four) times daily.     Travoprost (BAK Free) 0.004 % Soln ophthalmic solution  Commonly known as:  TRAVATAN  Place 2 drops into both eyes at bedtime.     traZODone 50 MG tablet  Commonly known as:  DESYREL  Take 100 mg by mouth at bedtime.     vitamin B-12 500 MCG tablet  Commonly known as:  CYANOCOBALAMIN  Take 500 mcg by mouth daily.     vitamin C 500 MG tablet  Commonly known as:  ASCORBIC ACID  Take 500 mg by mouth daily.     VOLTAREN 1 % Gel  Generic drug:  diclofenac sodium  Apply 2 g topically 2 (two) times daily as needed (left neck pain).       Allergies  Allergen Reactions  . Aspirin Other (See Comments)    G.I. Upset only    Follow-up Information   Follow up with [facility], MD In 1 month.   Specialty:  Internal Medicine   Contact information:   [address] [place] [address] [phone]        The results of significant diagnostics from this hospitalization (including imaging, microbiology, ancillary and laboratory) are listed below for reference.  Significant Diagnostic Studies: Dg Abd 1 View  10/14/2013   CLINICAL DATA:  Distension.  Follow-up pseudo obstruction.  EXAM: ABDOMEN - 1 VIEW  COMPARISON:  10/13/2013.  FINDINGS: Gas distended colon with superior displacement of the sigmoid colon. Sigmoid colon measures up to 11.6 cm versus prior 13.5 cm.  The possibility of free intraperitoneal air cannot be assessed on a supine view.  Calcified tortuous aorta.  Post left hip replacement.  IMPRESSION: Gas distended colon with superior displacement of the sigmoid colon. Sigmoid colon measures up to 11.6 cm versus prior 13.5 cm.   Electronically Signed   By: Chauncey Cruel M.D.   On: 10/14/2013 10:39   Ct Abdomen Pelvis W Contrast  10/09/2013   CLINICAL DATA:  Diarrhea, vomiting, abdominal pain  EXAM: CT ABDOMEN AND PELVIS WITH CONTRAST  TECHNIQUE: Multidetector CT imaging of the abdomen and pelvis was performed using the standard protocol following bolus administration of intravenous contrast.  CONTRAST:  34mL OMNIPAQUE IOHEXOL 300 MG/ML SOLN, 51mL OMNIPAQUE IOHEXOL 300 MG/ML SOLN  COMPARISON:  DG ABD ACUTE W/CHEST dated 10/09/2013; CT ABD/PELV WO CM dated 07/24/2013  FINDINGS: There are bilateral small pleural effusions, right greater than left with bibasilar atelectasis. There is stable cardiomegaly.  The liver demonstrates no focal abnormality. There is no intrahepatic or extrahepatic biliary ductal dilatation. The gallbladder is surgically absent. The spleen demonstrates no focal abnormality.There is a horseshoe kidney with left nephrolithiasis. There is no obstructive uropathy. The adrenal glands  and pancreas are normal. The bladder is unremarkable.  There is gaseous distension of the colon with multiple colonic air-fluid levels. There is no significant small bowel dilatation. There are multiple small bowel air-fluid levels. There is no pneumoperitoneum, pneumatosis, or portal venous gas. There is no abdominal or pelvic free fluid. There is no lymphadenopathy.  The abdominal aorta is normal in caliber with atherosclerosis.  There are no lytic or sclerotic osseous lesions. There is mild lumbar spine spondylosis.  IMPRESSION: 1. Multiple small bowel and colonic air-fluid levels with nonspecific gaseous distention of the colon. This appearance can be seen with an ileus versus better colitis.  2.  Small bilateral pleural effusions and bibasilar atelectasis.  3.  Horseshoe kidney.  Left nephrolithiasis.   Electronically Signed   By: Kathreen Devoid   On: 10/09/2013 23:57   Dg Chest Port 1 View  10/13/2013   CLINICAL DATA:  Abdominal distention and pain.  EXAM: PORTABLE CHEST - 1 VIEW  COMPARISON:  DG ABD ACUTE W/CHEST dated 10/09/2013  FINDINGS: Cardiac enlargement with mild pulmonary vascular congestion. No edema or consolidation. No blunting of costophrenic angles. No pneumothorax.  IMPRESSION: Cardiac enlargement with mild pulmonary vascular congestion.   Electronically Signed   By: Lucienne Capers M.D.   On: 10/13/2013 04:10   Dg Abd Acute W/chest  10/09/2013   CLINICAL DATA:  Abdominal pain, emesis and weakness.  EXAM: ACUTE ABDOMEN SERIES (ABDOMEN 2 VIEW & CHEST 1 VIEW)  COMPARISON:  DG ABD PORTABLE 1V dated 07/27/2013; DG ABD ACUTE W/CHEST dated 07/24/2013  FINDINGS: Cardiac silhouette is moderately enlarged, mediastinal silhouette is tissues. Mild central pulmonary vasculature congestion interstitial prominence with patchy left lower lobe airspace opacity. No pleural effusions. No pneumothorax. Severely calcified thoracic aorta. Multiple EKG lines overlie the patient and may obscure subtle underlying  pathology.  Diffuse colonic gaseous distention, sigmoid is 13 cm in transaxial dimension, previously 10 cm.  Surgical clips in the right upper quadrant. No intra-abdominal mass effect. Phleboliths in the pelvis. Status post  left hip hemiarthroplasty.  IMPRESSION: Cardiomegaly, central pulmonary vasculature congestion with patchy left lower lobe airspace opacity which may reflect atelectasis or even early pneumonia.  Worsening gaseous distention of the colon, though this may reflect ileus, distal large bowel obstruction may have a similar appearance.   Electronically Signed   By: Elon Alas   On: 10/09/2013 20:27   Dg Abd Portable 2v  10/13/2013   CLINICAL DATA:  Abdominal distention and pain  EXAM: PORTABLE ABDOMEN - 2 VIEW  COMPARISON:  CT ABD/PELVIS W CM dated 10/09/2013; DG ABD ACUTE W/CHEST dated 10/09/2013  FINDINGS: Moderate diffuse gaseous distention of the colon. No definite small bowel distention. Appearance is similar to previous study and likely represents adynamic ileus. No free air is appreciated. Surgical clips in the right upper quadrant. Postoperative left hip hemiarthroplasty.  IMPRESSION: Persisting gaseous distention of the colon most likely representing adynamic ileus.   Electronically Signed   By: Lucienne Capers M.D.   On: 10/13/2013 04:09    Microbiology: Recent Results (from the past 240 hour(s))  CLOSTRIDIUM DIFFICILE BY PCR     Status: Abnormal   Collection Time    10/17/13  7:30 AM      Result Value Ref Range Status   C difficile by pcr POSITIVE (*) NEGATIVE Final   Comment: CRITICAL RESULT CALLED TO, READ BACK BY AND VERIFIED WITH:     Elane Fritz RN 9:35 10/17/13 ([email or web address])     Performed at [place]: Basic Metabolic Panel:  Recent Labs Lab 10/19/13 0435  10/20/13 0345 10/21/13 0335 10/21/13 1604 10/22/13 0534 10/22/13 1354 10/23/13 0438  NA 143  --  143 143  --  144  --  141  K 2.9*  < > 3.1* 2.8* 3.3* 3.1* 3.2* 3.3*  CL 114*  --  115*  115*  --  116*  --  114*  CO2 16*  --  17* 17*  --  16*  --  16*  GLUCOSE 106*  --  134* 124*  --  129*  --  133*  BUN 3*  --  3* <3*  --  <3*  --  <3*  CREATININE 0.94  --  0.92 0.93  --  1.00  --  0.93  CALCIUM 8.3*  --  8.4 8.4  --  8.4  --  8.4  MG 1.8  --  1.7 1.6  --  1.6  --  2.3  < > = values in this interval not displayed. Liver Function Tests: No results found for this basename: AST, ALT, ALKPHOS, BILITOT, PROT, ALBUMIN,  in the last 168 hours No results found for this basename: LIPASE, AMYLASE,  in the last 168 hours No results found for this basename: AMMONIA,  in the last 168 hours CBC:  Recent Labs Lab 10/17/13 1000 10/19/13 0435  WBC 5.6 5.2  NEUTROABS 3.8  --   HGB 8.2* 8.1*  HCT 23.8* 23.6*  MCV 98.3 100.4*  PLT 177 173   Cardiac Enzymes: No results found for this basename: CKTOTAL, CKMB, CKMBINDEX, TROPONINI,  in the last 168 hours BNP: BNP (last 3 results)  Recent Labs  05/24/13 0250  PROBNP 3915.0*   CBG: No results found for this basename: GLUCAP,  in the last 168 hours     Signed:  Janece Canterbury  [facility] 10/23/2013, 10:25 AM

## 2013-10-22 NOTE — Progress Notes (Signed)
[facility] PROGRESS NOTE  Maria Christian XLK:[id number] DOB: November 17, 1924 DOA: 10/09/2013 PCP: Ezequiel Kayser, MD  Assessment/Plan  IleUS:  -improved.  -tolerating soft diet   Hypokalemia  -Continue to replete with potassium chloride 2meq TID PO -Mg ok at 1.6, replete with 4 gms of mag sulfate.   Hypernatremia, due to free water deficit from decreased PO intake.  -Resolved with dextrose fluids   ARF, Likely 2/2 prerenal azotemia and dehydration.  RESOLVED with IVF  Anemia, stable.  Defer to PCP  Chronic Diastolic CHF, compensated at present.   HTN, blood pressure stable, continue norvasc and carvedilol  C diff diarrhea:  Continue po flagyl day 6, recommend 2 week therapy.  Appreciate GI recommendations.   Diet:  Dysphagia 1 Access:  PIV IVF:  off Proph:  heparin  Code Status: full Family Communication: patient and her son, HCPOA Disposition Plan: likely to SNF tomorrow  Consultants:  GI  Antibiotics:  Flagyl 5/15 Procedure: -  CT abd/pelvis 5/7  HPI/Subjective:  States she feels well.  She is thirsty.  Denies SOB, nausea, vomiting, abdominal pain.  3 recorded stools since yesterday  Objective: Filed Vitals:   10/21/13 1433 10/21/13 2133 10/22/13 0511 10/22/13 1422  BP: 150/75 131/57 132/59 120/60  Pulse: 87 95 98 73  Temp: 97.4 F (36.3 C) 98.2 F (36.8 C) 98.3 F (36.8 C) 98.1 F (36.7 C)  TempSrc: Oral Oral Oral Oral  Resp: 20 20 20 18   Height:      Weight:   78.5 kg (173 lb 1 oz)   SpO2: 98% 98% 98% 97%    Intake/Output Summary (Last 24 hours) at 10/22/13 1514 Last data filed at 10/21/13 2300  Gross per 24 hour  Intake    120 ml  Output      0 ml  Net    120 ml   Filed Weights   10/20/13 0647 10/21/13 0600 10/22/13 0511  Weight: 74.5 kg (164 lb 3.9 oz) 73.5 kg (162 lb 0.6 oz) 78.5 kg (173 lb 1 oz)    Exam:   General:  CF, No acute distress  HEENT:  NCAT, MMM  Cardiovascular:  nl S1, S2 no mrg, 2+ pulses, warm  extremities  Respiratory:  CTAB, no increased WOB  Abdomen:   NABS, soft, NT/ND  MSK:   Normal tone and bulk, no LEE  Neuro:  Grossly intact  Data Reviewed: Basic Metabolic Panel:  Recent Labs Lab 10/18/13 0858  10/19/13 0435  10/20/13 0345 10/21/13 0335 10/21/13 1604 10/22/13 0534 10/22/13 1354  NA 144  --  143  --  143 143  --  144  --   K 2.7*  < > 2.9*  < > 3.1* 2.8* 3.3* 3.1* 3.2*  CL 114*  --  114*  --  115* 115*  --  116*  --   CO2 17*  --  16*  --  17* 17*  --  16*  --   GLUCOSE 125*  --  106*  --  134* 124*  --  129*  --   BUN 4*  --  3*  --  3* <3*  --  <3*  --   CREATININE 0.95  --  0.94  --  0.92 0.93  --  1.00  --   CALCIUM 8.2*  --  8.3*  --  8.4 8.4  --  8.4  --   MG 1.9  --  1.8  --  1.7 1.6  --  1.6  --   < > =  values in this interval not displayed. Liver Function Tests: No results found for this basename: AST, ALT, ALKPHOS, BILITOT, PROT, ALBUMIN,  in the last 168 hours No results found for this basename: LIPASE, AMYLASE,  in the last 168 hours No results found for this basename: AMMONIA,  in the last 168 hours CBC:  Recent Labs Lab 10/17/13 1000 10/19/13 0435  WBC 5.6 5.2  NEUTROABS 3.8  --   HGB 8.2* 8.1*  HCT 23.8* 23.6*  MCV 98.3 100.4*  PLT 177 173   Cardiac Enzymes: No results found for this basename: CKTOTAL, CKMB, CKMBINDEX, TROPONINI,  in the last 168 hours BNP (last 3 results)  Recent Labs  05/24/13 0250  PROBNP 3915.0*   CBG: No results found for this basename: GLUCAP,  in the last 168 hours  Recent Results (from the past 240 hour(s))  CLOSTRIDIUM DIFFICILE BY PCR     Status: Abnormal   Collection Time    10/17/13  7:30 AM      Result Value Ref Range Status   C difficile by pcr POSITIVE (*) NEGATIVE Final   Comment: CRITICAL RESULT CALLED TO, READ BACK BY AND VERIFIED WITH:     Elane Fritz RN 9:35 10/17/13 ([email or web address])     Performed at [facility]     Studies: No results found.  Scheduled Meds: . amLODipine  5  mg Oral Daily  . carbamide peroxide  5 drop Left Ear BID  . carvedilol  3.125 mg Oral Daily  . clopidogrel  75 mg Oral Daily  . donepezil  5 mg Oral QHS  . dorzolamide  1 drop Both Eyes TID  . feeding supplement (RESOURCE BREEZE)  1 Container Oral BID BM  . gabapentin  300 mg Oral BID  . heparin subcutaneous  5,000 Units Subcutaneous Q12H  . ketotifen  1 drop Both Eyes BID  . latanoprost  1 drop Both Eyes QHS  . metroNIDAZOLE  500 mg Oral 3 times per day  . potassium chloride  40 mEq Oral Once  . potassium chloride  40 mEq Oral TID  . saccharomyces boulardii  250 mg Oral BID  . sertraline  50 mg Oral Daily  . sodium chloride  3 mL Intravenous Q12H   Continuous Infusions: . sodium chloride 20 mL/hr at 10/16/13 2248    Principal Problem:   Bowel obstruction Active Problems:   Atrial fibrillation with controlled ventricular response   Diastolic CHF, chronic   Hyperlipemia   Diabetes mellitus   Dementia   Dehydration   Acute renal failure   Anemia   Malnutrition of moderate degree   Enteritis due to Clostridium difficile   Hypokalemia    Time spent: 30 min    [facility] Pager [phone]. If 7PM-7AM, please contact night-coverage at [email or web address], password [facility] 10/22/2013, 3:14 PM  LOS: 13 days

## 2013-10-22 NOTE — Progress Notes (Signed)
NUTRITION FOLLOW UP  Intervention:   -Downgrade to Dys1 diet-allow certain foods per pt request, modify as needed -Continue to recommend Resource Breeze BID -Will continue to monitor  Nutrition Dx:   Inadequate oral intake related to nausea/mouth sores/abd distension as evidenced by PO intake <75% for two weeks-ongoing   Goal:   Pt to meet >/= 90% of their estimated nutrition needs- unmet     Monitor:   Diet order, GI profile, labs, weights, skin integrity    Assessment:   5/08:  -Pt's daughter reported decreased appetite for two weeks  -Had downgraded diet to puree 2-3 days ago as pt developed mouth sores and required softer foods to tolerate  -Family had been encouraging different supplements-Mightyshakes, MagicCup and ice creams. Pt consumed supplements but with minimal PO intake of other meal items  -Endorsed ongoing unintentional wt loss for past 5 months. Was unsure amount pt lost, however daughter indicated pt had been losing 1-2 lbs/week  -Daughter noted pt with large amounts of stomach distension. Was given fleet enema on 5/08 with results  -Currently NPO d/t possible colitis vs ileus vs bowel obstruction. NGT on low intermittent suction, no output documented  -Elevated BUN/Crt d/t dehydration  -Low K- being repleted  -Mg WNL  5/14: -Pt advanced to Soft diet on 5/13 -Has minimal PO intake, <10% of meals -Attempted to assist feeding pt; however pt refused additional food after one bite of peaches d/t nausea. Would likely not consume Ensure/MagicCup/Boost as they may exacerbate GI symptoms. Was able to tolerate gingerale and coffee -Will recommend Resource Breeze for supplement alternative -MD noted plan to perform flex sigmoidoscopy to remove air from colon, which may assist in improving appetite -Hypernatremia improved -Low K-being repleted -Mg WNL -Wt increased 10 lbs in 6 days, some of which may be attributed to fluids  5/20: -Per GI, decompressive  sigmoidoscopy was not warranted as her abd exam appeared normal. -Positive for C.diff, had 3 loose BM's yesterday, which is also possibly contributing to poor PO -Pt with minimal appetite, 0-25% of meals. -Downgraded to Dysphagia 1 per pt's daughter request; however Pt asking for cereal and sandwiches. Discussed softening foods with RN to promote PO intake (adding extra milk with dry cereal, pureeing deli meats for sandwiches) -Enjoying Resource Breeze, consuming one supplement/day -K remains low, receiving oral and IV potassium chloride. MD has encouraged pt to consume mashed banana and orange juice with meals -ARF resolved, d/c pending K levels -RN noted pt currently with stage I PU on buttock    Height: Ht Readings from Last 1 Encounters:  10/10/13 5\' 8"  (1.727 m)    Weight Status:   Wt Readings from Last 1 Encounters:  10/22/13 173 lb 1 oz (78.5 kg)  10/10/13 154 lbs  Re-estimated needs:  Kcal: 1900-2100  Protein: 80-90 gram  Fluid: >/=1900 ml/daily  Skin: stage I PU on buttocks  Diet Order: Dysphagia 1   Intake/Output Summary (Last 24 hours) at 10/22/13 1102 Last data filed at 10/21/13 2300  Gross per 24 hour  Intake    120 ml  Output      0 ml  Net    120 ml    Last BM: 5/20   Labs:   Recent Labs Lab 10/20/13 0345 10/21/13 0335 10/21/13 1604 10/22/13 0534  NA 143 143  --  144  K 3.1* 2.8* 3.3* 3.1*  CL 115* 115*  --  116*  CO2 17* 17*  --  16*  BUN 3* <3*  --  <  3*  CREATININE 0.92 0.93  --  1.00  CALCIUM 8.4 8.4  --  8.4  MG 1.7 1.6  --  1.6  GLUCOSE 134* 124*  --  129*    CBG (last 3)  No results found for this basename: GLUCAP,  in the last 72 hours  Scheduled Meds: . amLODipine  5 mg Oral Daily  . carbamide peroxide  5 drop Left Ear BID  . carvedilol  3.125 mg Oral Daily  . clopidogrel  75 mg Oral Daily  . donepezil  5 mg Oral QHS  . dorzolamide  1 drop Both Eyes TID  . feeding supplement (RESOURCE BREEZE)  1 Container Oral BID BM  .  gabapentin  300 mg Oral BID  . heparin subcutaneous  5,000 Units Subcutaneous Q12H  . ketotifen  1 drop Both Eyes BID  . latanoprost  1 drop Both Eyes QHS  . metroNIDAZOLE  500 mg Oral 3 times per day  . potassium chloride  40 mEq Oral Once  . potassium chloride  40 mEq Oral TID  . saccharomyces boulardii  250 mg Oral BID  . sertraline  50 mg Oral Daily  . sodium chloride  3 mL Intravenous Q12H    Continuous Infusions: . sodium chloride 20 mL/hr at 10/16/13 [place] LDN Clinical Dietitian [phone]

## 2013-10-23 DIAGNOSIS — F0280 Dementia in other diseases classified elsewhere without behavioral disturbance: Secondary | ICD-10-CM | POA: Diagnosis present

## 2013-10-23 DIAGNOSIS — Z9981 Dependence on supplemental oxygen: Secondary | ICD-10-CM | POA: Diagnosis not present

## 2013-10-23 DIAGNOSIS — F329 Major depressive disorder, single episode, unspecified: Secondary | ICD-10-CM | POA: Diagnosis present

## 2013-10-23 DIAGNOSIS — N183 Chronic kidney disease, stage 3 unspecified: Secondary | ICD-10-CM | POA: Diagnosis present

## 2013-10-23 DIAGNOSIS — Z96649 Presence of unspecified artificial hip joint: Secondary | ICD-10-CM | POA: Diagnosis not present

## 2013-10-23 DIAGNOSIS — Z7982 Long term (current) use of aspirin: Secondary | ICD-10-CM | POA: Diagnosis not present

## 2013-10-23 DIAGNOSIS — R141 Gas pain: Secondary | ICD-10-CM | POA: Diagnosis not present

## 2013-10-23 DIAGNOSIS — M6281 Muscle weakness (generalized): Secondary | ICD-10-CM | POA: Diagnosis not present

## 2013-10-23 DIAGNOSIS — F0391 Unspecified dementia with behavioral disturbance: Secondary | ICD-10-CM | POA: Diagnosis not present

## 2013-10-23 DIAGNOSIS — Z91199 Patient's noncompliance with other medical treatment and regimen due to unspecified reason: Secondary | ICD-10-CM | POA: Diagnosis not present

## 2013-10-23 DIAGNOSIS — E46 Unspecified protein-calorie malnutrition: Secondary | ICD-10-CM | POA: Diagnosis not present

## 2013-10-23 DIAGNOSIS — M79609 Pain in unspecified limb: Secondary | ICD-10-CM | POA: Diagnosis not present

## 2013-10-23 DIAGNOSIS — N390 Urinary tract infection, site not specified: Secondary | ICD-10-CM | POA: Diagnosis not present

## 2013-10-23 DIAGNOSIS — Z515 Encounter for palliative care: Secondary | ICD-10-CM | POA: Diagnosis not present

## 2013-10-23 DIAGNOSIS — M2010 Hallux valgus (acquired), unspecified foot: Secondary | ICD-10-CM | POA: Diagnosis not present

## 2013-10-23 DIAGNOSIS — E785 Hyperlipidemia, unspecified: Secondary | ICD-10-CM | POA: Diagnosis present

## 2013-10-23 DIAGNOSIS — D6489 Other specified anemias: Secondary | ICD-10-CM | POA: Diagnosis not present

## 2013-10-23 DIAGNOSIS — IMO0001 Reserved for inherently not codable concepts without codable children: Secondary | ICD-10-CM | POA: Diagnosis not present

## 2013-10-23 DIAGNOSIS — K562 Volvulus: Secondary | ICD-10-CM | POA: Diagnosis not present

## 2013-10-23 DIAGNOSIS — F0390 Unspecified dementia without behavioral disturbance: Secondary | ICD-10-CM | POA: Diagnosis not present

## 2013-10-23 DIAGNOSIS — N179 Acute kidney failure, unspecified: Secondary | ICD-10-CM | POA: Diagnosis not present

## 2013-10-23 DIAGNOSIS — J9620 Acute and chronic respiratory failure, unspecified whether with hypoxia or hypercapnia: Secondary | ICD-10-CM | POA: Diagnosis present

## 2013-10-23 DIAGNOSIS — K219 Gastro-esophageal reflux disease without esophagitis: Secondary | ICD-10-CM | POA: Diagnosis present

## 2013-10-23 DIAGNOSIS — Z9089 Acquired absence of other organs: Secondary | ICD-10-CM | POA: Diagnosis not present

## 2013-10-23 DIAGNOSIS — Z9119 Patient's noncompliance with other medical treatment and regimen: Secondary | ICD-10-CM | POA: Diagnosis not present

## 2013-10-23 DIAGNOSIS — Z860100 Personal history of colon polyps, unspecified: Secondary | ICD-10-CM | POA: Diagnosis not present

## 2013-10-23 DIAGNOSIS — Z886 Allergy status to analgesic agent status: Secondary | ICD-10-CM | POA: Diagnosis not present

## 2013-10-23 DIAGNOSIS — K56609 Unspecified intestinal obstruction, unspecified as to partial versus complete obstruction: Secondary | ICD-10-CM | POA: Diagnosis not present

## 2013-10-23 DIAGNOSIS — Z79899 Other long term (current) drug therapy: Secondary | ICD-10-CM | POA: Diagnosis not present

## 2013-10-23 DIAGNOSIS — D696 Thrombocytopenia, unspecified: Secondary | ICD-10-CM | POA: Diagnosis not present

## 2013-10-23 DIAGNOSIS — Z66 Do not resuscitate: Secondary | ICD-10-CM | POA: Diagnosis present

## 2013-10-23 DIAGNOSIS — I2510 Atherosclerotic heart disease of native coronary artery without angina pectoris: Secondary | ICD-10-CM | POA: Diagnosis present

## 2013-10-23 DIAGNOSIS — K560 Paralytic ileus: Secondary | ICD-10-CM | POA: Diagnosis not present

## 2013-10-23 DIAGNOSIS — F03918 Unspecified dementia, unspecified severity, with other behavioral disturbance: Secondary | ICD-10-CM | POA: Diagnosis not present

## 2013-10-23 DIAGNOSIS — I10 Essential (primary) hypertension: Secondary | ICD-10-CM | POA: Diagnosis not present

## 2013-10-23 DIAGNOSIS — R609 Edema, unspecified: Secondary | ICD-10-CM | POA: Diagnosis present

## 2013-10-23 DIAGNOSIS — I4891 Unspecified atrial fibrillation: Secondary | ICD-10-CM | POA: Diagnosis present

## 2013-10-23 DIAGNOSIS — N171 Acute kidney failure with acute cortical necrosis: Secondary | ICD-10-CM | POA: Diagnosis not present

## 2013-10-23 DIAGNOSIS — G20 Parkinson's disease: Secondary | ICD-10-CM | POA: Diagnosis not present

## 2013-10-23 DIAGNOSIS — H579 Unspecified disorder of eye and adnexa: Secondary | ICD-10-CM | POA: Diagnosis not present

## 2013-10-23 DIAGNOSIS — J90 Pleural effusion, not elsewhere classified: Secondary | ICD-10-CM | POA: Diagnosis present

## 2013-10-23 DIAGNOSIS — R5381 Other malaise: Secondary | ICD-10-CM | POA: Diagnosis not present

## 2013-10-23 DIAGNOSIS — F411 Generalized anxiety disorder: Secondary | ICD-10-CM | POA: Diagnosis present

## 2013-10-23 DIAGNOSIS — F3289 Other specified depressive episodes: Secondary | ICD-10-CM | POA: Diagnosis present

## 2013-10-23 DIAGNOSIS — I9589 Other hypotension: Secondary | ICD-10-CM | POA: Diagnosis not present

## 2013-10-23 DIAGNOSIS — I509 Heart failure, unspecified: Secondary | ICD-10-CM | POA: Diagnosis present

## 2013-10-23 DIAGNOSIS — R188 Other ascites: Secondary | ICD-10-CM | POA: Diagnosis not present

## 2013-10-23 DIAGNOSIS — Z87891 Personal history of nicotine dependence: Secondary | ICD-10-CM | POA: Diagnosis not present

## 2013-10-23 DIAGNOSIS — R404 Transient alteration of awareness: Secondary | ICD-10-CM | POA: Diagnosis not present

## 2013-10-23 DIAGNOSIS — R0602 Shortness of breath: Secondary | ICD-10-CM | POA: Diagnosis present

## 2013-10-23 DIAGNOSIS — Z1331 Encounter for screening for depression: Secondary | ICD-10-CM | POA: Diagnosis not present

## 2013-10-23 DIAGNOSIS — Z8672 Personal history of thrombophlebitis: Secondary | ICD-10-CM | POA: Diagnosis not present

## 2013-10-23 DIAGNOSIS — O10019 Pre-existing essential hypertension complicating pregnancy, unspecified trimester: Secondary | ICD-10-CM | POA: Diagnosis not present

## 2013-10-23 DIAGNOSIS — Z5189 Encounter for other specified aftercare: Secondary | ICD-10-CM | POA: Diagnosis not present

## 2013-10-23 DIAGNOSIS — E860 Dehydration: Secondary | ICD-10-CM | POA: Diagnosis not present

## 2013-10-23 DIAGNOSIS — E876 Hypokalemia: Secondary | ICD-10-CM | POA: Diagnosis not present

## 2013-10-23 DIAGNOSIS — D649 Anemia, unspecified: Secondary | ICD-10-CM | POA: Diagnosis not present

## 2013-10-23 DIAGNOSIS — I5033 Acute on chronic diastolic (congestive) heart failure: Secondary | ICD-10-CM | POA: Diagnosis present

## 2013-10-23 DIAGNOSIS — G4700 Insomnia, unspecified: Secondary | ICD-10-CM | POA: Diagnosis not present

## 2013-10-23 DIAGNOSIS — J811 Chronic pulmonary edema: Secondary | ICD-10-CM | POA: Diagnosis not present

## 2013-10-23 DIAGNOSIS — I129 Hypertensive chronic kidney disease with stage 1 through stage 4 chronic kidney disease, or unspecified chronic kidney disease: Secondary | ICD-10-CM | POA: Diagnosis present

## 2013-10-23 DIAGNOSIS — R1110 Vomiting, unspecified: Secondary | ICD-10-CM | POA: Diagnosis not present

## 2013-10-23 DIAGNOSIS — I5032 Chronic diastolic (congestive) heart failure: Secondary | ICD-10-CM | POA: Diagnosis not present

## 2013-10-23 DIAGNOSIS — I5031 Acute diastolic (congestive) heart failure: Secondary | ICD-10-CM | POA: Diagnosis not present

## 2013-10-23 DIAGNOSIS — A0472 Enterocolitis due to Clostridium difficile, not specified as recurrent: Secondary | ICD-10-CM | POA: Diagnosis present

## 2013-10-23 DIAGNOSIS — Z7401 Bed confinement status: Secondary | ICD-10-CM | POA: Diagnosis not present

## 2013-10-23 DIAGNOSIS — B351 Tinea unguium: Secondary | ICD-10-CM | POA: Diagnosis not present

## 2013-10-23 DIAGNOSIS — E119 Type 2 diabetes mellitus without complications: Secondary | ICD-10-CM | POA: Diagnosis present

## 2013-10-23 DIAGNOSIS — Z7902 Long term (current) use of antithrombotics/antiplatelets: Secondary | ICD-10-CM | POA: Diagnosis not present

## 2013-10-23 LAB — BASIC METABOLIC PANEL
CHLORIDE: 114 meq/L — AB (ref 96–112)
CO2: 16 meq/L — AB (ref 19–32)
CREATININE: 0.93 mg/dL (ref 0.50–1.10)
Calcium: 8.4 mg/dL (ref 8.4–10.5)
GFR calc non Af Amer: 54 mL/min — ABNORMAL LOW (ref >90)
GFR, EST AFRICAN AMERICAN: 62 mL/min — AB (ref >90)
Glucose, Bld: 133 mg/dL — ABNORMAL HIGH (ref 70–99)
Potassium: 3.3 mEq/L — ABNORMAL LOW (ref 3.7–5.3)
Sodium: 141 mEq/L (ref 137–147)

## 2013-10-23 LAB — MAGNESIUM: Magnesium: 2.3 mg/dL (ref 1.5–2.5)

## 2013-10-23 MED ORDER — POTASSIUM CHLORIDE CRYS ER 20 MEQ PO TBCR
60.0000 meq | EXTENDED_RELEASE_TABLET | Freq: Three times a day (TID) | ORAL | Status: DC
  Administered 2013-10-23 (×2): 60 meq via ORAL
  Filled 2013-10-23 (×3): qty 3

## 2013-10-23 MED ORDER — POTASSIUM CHLORIDE CRYS ER 20 MEQ PO TBCR: 60.0000 meq | EXTENDED_RELEASE_TABLET | Freq: Three times a day (TID) | ORAL | Status: DC

## 2013-10-23 MED ORDER — METRONIDAZOLE 500 MG PO TABS: 500.0000 mg | ORAL_TABLET | Freq: Three times a day (TID) | ORAL | Status: DC

## 2013-10-23 MED ORDER — ALPRAZOLAM 0.25 MG PO TABS
ORAL_TABLET | ORAL | Status: DC
Start: 2013-10-23 — End: 2013-12-10

## 2013-10-23 NOTE — Progress Notes (Signed)
CSW assisting with d/c planning. Pt will return to [facility] today via [facility] transport. Pick up has been arranged for 2:15. Pt's son has been contacted and he is in agreement with d/c plan. NSG will contact son once [facility] has arrived for transport.  Werner Lean LCSW [phone]

## 2013-10-23 NOTE — Progress Notes (Signed)
Pt transported to SNF via [facility], pt's son [name] was notified

## 2013-10-23 NOTE — Progress Notes (Signed)
Report called to Lexine Baton at [facility]. Waiting for transportation to pick up pt. Pt is stable with no complain.

## 2013-10-28 ENCOUNTER — Non-Acute Institutional Stay (SKILLED_NURSING_FACILITY): Payer: Medicare Other | Admitting: Internal Medicine

## 2013-10-28 ENCOUNTER — Encounter: Payer: Self-pay | Admitting: Internal Medicine

## 2013-10-28 DIAGNOSIS — I10 Essential (primary) hypertension: Secondary | ICD-10-CM

## 2013-10-28 DIAGNOSIS — K560 Paralytic ileus: Secondary | ICD-10-CM

## 2013-10-28 DIAGNOSIS — A0472 Enterocolitis due to Clostridium difficile, not specified as recurrent: Secondary | ICD-10-CM | POA: Diagnosis not present

## 2013-10-28 DIAGNOSIS — D649 Anemia, unspecified: Secondary | ICD-10-CM

## 2013-10-28 DIAGNOSIS — E876 Hypokalemia: Secondary | ICD-10-CM | POA: Diagnosis not present

## 2013-10-28 DIAGNOSIS — K567 Ileus, unspecified: Secondary | ICD-10-CM

## 2013-10-28 DIAGNOSIS — N179 Acute kidney failure, unspecified: Secondary | ICD-10-CM | POA: Diagnosis not present

## 2013-10-28 NOTE — Assessment & Plan Note (Signed)
c diff diarrhea;  Improving. On flagyl to complete 14 day course

## 2013-10-28 NOTE — Assessment & Plan Note (Signed)
Vs SBO which pt has had before 2/2 volvulus;appears pt had ileus which is reported resolved with  IVF and clear liquid; pt had very few BS today and some abd pain. Maybe its just the c diff but have started IVF with D5 1/2 NS at 75cc/hr for 2 liters and will monitor.

## 2013-10-28 NOTE — Progress Notes (Signed)
MRN: [id number] Name: Maria Christian  Sex: female Age: 80 y.o. DOB: 24-Dec-1924  [place] #: [name] Facility/Room: 220A Level Of Care: SNF Provider: Hennie Duos Emergency Contacts: Extended Emergency Contact Information Primary Emergency Contact: [name] [place] Phone: [phone] Relation: Son Secondary Emergency Contact: Roberts,Lynn Address: [address]          [place], [place] [address] [place] Phone: [phone] Mobile Phone: [phone] Relation: Daughter  Code Status: FULL  Allergies: Aspirin  Chief Complaint  Patient presents with  . nursing home admission    HPI: Patient is 80 y.o. female who was d/c form hospital after an ileus, c diff and mild ARF.  Past Medical History  Diagnosis Date  . Atrial fibrillation   . Altered mental status   . Encephalopathy   . Parkinson disease   . Hypertension   . GERD (gastroesophageal reflux disease)   . Hyperlipemia   . Diabetes mellitus   . Coronary artery disease   . History of adenomatous polyp of colon 06/24/99  . Enlarged heart   . DEMENTIA   . Edema of lower extremity 07/13/11    right leg more swollen than left leg  . Esophageal dysmotility 07/02/12  . Horseshoe kidney   . Pancreatic lesion 05/22/11    no further workup  per PCP/family due to age  . Anemia   . Peripheral neuropathy   . Depression   . Thrombophlebitis     Past Surgical History  Procedure Laterality Date  . Shoulder surgery  2001    left clavicle excision and acromioplasty  . Abdominal hysterectomy    . Cholecystectomy    . Total hip arthroplasty    . Breast surgery      2 benign tumors removed left breast  . Foot surgery      benign tumors from foot  . Esophageal dilation      several times by Dr. [name]  . Nose surgery    . Esophagogastroduodenoscopy (egd) with esophageal dilation N/A 08/01/2012    Procedure: ESOPHAGOGASTRODUODENOSCOPY (EGD) WITH ESOPHAGEAL DILATION;   Surgeon: [name], MD;  Location: [facility] ENDOSCOPY;  Service: Endoscopy;  Laterality: N/A;  with c-arm savory dilators  . Flexible sigmoidoscopy N/A 05/24/2013    Procedure: FLEXIBLE SIGMOIDOSCOPY;  Surgeon: [name], MD;  Location: [facility]COPY;  Service: Endoscopy;  Laterality: N/A;  . Flexible sigmoidoscopy N/A 05/26/2013    Procedure:  flex with decompression of sigmoid volvulus;  Surgeon: [name], MD;  Location: [facility] ENDOSCOPY;  Service: Endoscopy;  Laterality: N/A;      Medication List       This list is accurate as of: 10/28/13  7:58 PM.  Always use your most recent med list.               acetaminophen 500 MG tablet  Commonly known as:  TYLENOL  Take 1,000 mg by mouth 3 (three) times daily.     ALPRAZolam 0.25 MG tablet  Commonly known as:  XANAX  Take one tablet by mouth once daily at 2pm; Take one tablet by mouth at bedtime as needed; Take one tablet by mouth every 12 hours as needed     amLODipine 5 MG tablet  Commonly known as:  NORVASC  Take 5 mg by mouth daily.     aspirin 81 MG chewable tablet  Chew 81 mg by mouth daily.     carvedilol 3.125 MG tablet  Commonly  known as:  COREG  Take 3.125 mg by mouth daily.     CERTAGEN PO  Take 1 tablet by mouth daily.     cholecalciferol 1000 UNITS tablet  Commonly known as:  VITAMIN D  Take 1,000 Units by mouth daily.     clopidogrel 75 MG tablet  Commonly known as:  PLAVIX  Take 75 mg by mouth daily.     Cranberry 475 MG Caps  Take 1 capsule by mouth 2 (two) times daily.     cyclobenzaprine 5 MG tablet  Commonly known as:  FLEXERIL  Take 5 mg by mouth at bedtime.     dextromethorphan-guaiFENesin 30-600 MG per 12 hr tablet  Commonly known as:  MUCINEX DM  Take 1 tablet by mouth 2 (two) times daily as needed for cough.     donepezil 5 MG tablet  Commonly known as:  ARICEPT  Take 5 mg by mouth at bedtime.     dorzolamide 2 % ophthalmic solution  Commonly known as:  TRUSOPT  Place 1 drop into  both eyes 3 (three) times daily.     ferrous sulfate 325 (65 FE) MG tablet  Take 325 mg by mouth daily with breakfast.     gabapentin 300 MG capsule  Commonly known as:  NEURONTIN  Take 300 mg by mouth 2 (two) times daily.     hydrALAZINE 10 MG tablet  Commonly known as:  APRESOLINE  Take 10 mg by mouth 3 (three) times daily. HOLD if SBP <100/60 and pulse is less than 60     ibuprofen 400 MG tablet  Commonly known as:  ADVIL,MOTRIN  Take 400 mg by mouth every 6 (six) hours as needed for moderate pain.     ketotifen 0.025 % ophthalmic solution  Commonly known as:  ZADITOR  Place 1 drop into both eyes 2 (two) times daily.     lidocaine 5 %  Commonly known as:  LIDODERM  Place 1 patch onto the skin daily. Apply 1 patch to left lower back/hip every morning.  Remove & Discard patch within 12 hours or as directed by MD     loratadine 10 MG tablet  Commonly known as:  CLARITIN  Take 10 mg by mouth daily.     metroNIDAZOLE 500 MG tablet  Commonly known as:  FLAGYL  Take 1 tablet (500 mg total) by mouth every 8 (eight) hours.     ondansetron 4 MG tablet  Commonly known as:  ZOFRAN  Take 4 mg by mouth every 8 (eight) hours as needed for nausea or vomiting.     potassium chloride SA 20 MEQ tablet  Commonly known as:  K-DUR,KLOR-CON  Take 3 tablets (60 mEq total) by mouth 3 (three) times daily.     pravastatin 20 MG tablet  Commonly known as:  PRAVACHOL  Take 20 mg by mouth at bedtime.     sertraline 50 MG tablet  Commonly known as:  ZOLOFT  Take 50 mg by mouth daily.     sodium bicarbonate 650 MG tablet  Take 650 mg by mouth 2 (two) times daily.     SYSTANE OP  Place 1 drop into both eyes 4 (four) times daily.     Travoprost (BAK Free) 0.004 % Soln ophthalmic solution  Commonly known as:  TRAVATAN  Place 2 drops into both eyes at bedtime.     traZODone 50 MG tablet  Commonly known as:  DESYREL  Take 100 mg by mouth at bedtime.  vitamin B-12 500 MCG tablet   Commonly known as:  CYANOCOBALAMIN  Take 500 mcg by mouth daily.     vitamin C 500 MG tablet  Commonly known as:  ASCORBIC ACID  Take 500 mg by mouth daily.     VOLTAREN 1 % Gel  Generic drug:  diclofenac sodium  Apply 2 g topically 2 (two) times daily as needed (left neck pain).        No orders of the defined types were placed in this encounter.     There is no immunization history on file for this patient.  History  Substance Use Topics  . Smoking status: Former Smoker    Quit date: 10/09/1964  . Smokeless tobacco: Never Used  . Alcohol Use: No    Family history is noncontributory    Review of Systems  DATA OBTAINED: from patient, and granddaughter GENERAL: Feels well no fevers, fatigue, poor appetite SKIN: No itching, rash EYES: No eye pain, redness, discharge EARS: No earache, tinnitus, change in hearing NOSE: No congestion, drainage or bleeding  MOUTH/THROAT: No mouth or tooth pain, No sore throat RESPIRATORY: No cough, wheezing, SOB CARDIAC: No chest pain, palpitations, lower extremity edema  GI: mild abdominal pain, No N/V/D or constipation, No heartburn or reflux  GU: No dysuria, frequency or urgency, or incontinence  MUSCULOSKELETAL: No unrelieved bone/joint pain NEUROLOGIC: No headache, dizziness or focal weakness PSYCHIATRIC: No overt anxiety or sadness. Sleeps well. No behavior issue.   Filed Vitals:   10/28/13 1935  BP: 133/61  Pulse: 56  Temp: 98.1 F (36.7 C)  Resp: 14    Physical Exam  GENERAL APPEARANCE: Alert, conversant. Appropriately groomed. No acute distress.  SKIN: No diaphoresis rash HEAD: Normocephalic, atraumatic  EYES: Conjunctiva/lids clear. Pupils round, reactive. EOMs intact.  EARS: External exam WNL, canals clear. HOH.  NOSE: No deformity or discharge.  MOUTH/THROAT: Lips w/o lesions.  RESPIRATORY: Breathing is even, unlabored. Lung sounds are clear   CARDIOVASCULAR: Heart RRR no murmurs, rubs or gallops. No  peripheral edema.  GASTROINTESTINAL: Abdomen is soft, mild L side tender, no guard no rebound, not distended w/ very decreased BS. GENITOURINARY: Bladder non tender, not distended  MUSCULOSKELETAL: No abnormal joints or musculature NEUROLOGIC:  Cranial nerves 2-12 grossly intact. Moves all extremities no tremor. PSYCHIATRIC: Mood and affect appropriate to situation, no behavioral issues  Patient Active Problem List   Diagnosis Date Noted  . Ileus 10/28/2013  . Enteritis due to Clostridium difficile 10/17/2013  . Hypokalemia 10/17/2013  . Malnutrition of moderate degree 10/10/2013  . Bowel obstruction 10/09/2013  . Anemia 10/09/2013  . Acute renal failure 10/04/2013  . FTT (failure to thrive) in adult 07/29/2013  . Dehydration 07/25/2013  . Palliative care encounter 07/25/2013  . Weakness generalized 07/25/2013  . Hypotension 07/24/2013  . Volvulus of sigmoid colon 05/26/2013  . Pain in right shoulder joint 05/25/2013  . Sigmoid volvulus 05/24/2013  . Pulmonary hypertension 05/24/2013  . Sacral decubitus ulcer 05/24/2013  . HOH (hard of hearing) 05/24/2013  . TIA (transient ischemic attack) 12/19/2012  . Dementia 12/19/2012  . Insomnia 12/19/2012  . Depression 12/19/2012  . Constipation 12/19/2012  . Pancreatic mass 07/17/2011  . Dysphagia 07/17/2011  . Thrombocytopenia 07/16/2011  . E. coli UTI 07/13/2011  . History of adenomatous polyp of colon 07/02/2011  . Preventative health care 07/01/2011  . Hypertension   . GERD (gastroesophageal reflux disease)   . Hyperlipemia   . Diabetes mellitus   . Coronary artery disease   .  Atrial fibrillation with controlled ventricular response 05/24/2011  . Diastolic CHF, chronic 16/03/9603    CBC    Component Value Date/Time   WBC 5.2 10/19/2013 0435   RBC 2.35* 10/19/2013 0435   RBC 2.33* 10/17/2013 0342   HGB 8.1* 10/19/2013 0435   HCT 23.6* 10/19/2013 0435   PLT 173 10/19/2013 0435   MCV 100.4* 10/19/2013 0435   LYMPHSABS 1.0  10/17/2013 1000   MONOABS 0.6 10/17/2013 1000   EOSABS 0.2 10/17/2013 1000   BASOSABS 0.0 10/17/2013 1000    CMP     Component Value Date/Time   NA 141 10/23/2013 0438   K 3.3* 10/23/2013 0438   CL 114* 10/23/2013 0438   CO2 16* 10/23/2013 0438   GLUCOSE 133* 10/23/2013 0438   BUN <3* 10/23/2013 0438   CREATININE 0.93 10/23/2013 0438   CALCIUM 8.4 10/23/2013 0438   PROT 6.1 10/09/2013 1906   ALBUMIN 3.1* 10/09/2013 1906   AST 20 10/09/2013 1906   ALT 10 10/09/2013 1906   ALKPHOS 72 10/09/2013 1906   BILITOT 0.2* 10/09/2013 1906   GFRNONAA 54* 10/23/2013 0438   GFRAA 62* 10/23/2013 0438    Assessment and Plan  Ileus Vs SBO which pt has had before 2/2 volvulus;appears pt had ileus which is reported resolved with  IVF and clear liquid; pt had very few BS today and some abd pain. Maybe its just the c diff but have started IVF with D5 1/2 NS at 75cc/hr for 2 liters and will monitor.  Hypokalemia Was repleted in hospital and pt sent out on KCL 40 meq TID and unfortunately K+ today is 5.6; K+ dec to 40 meq BID and repeat BMP 5/29  Enteritis due to Clostridium difficile c diff diarrhea;  Improving. On flagyl to complete 14 day course   Acute renal failure Prerenal from dehydration 2/2 poor po intake and diarrhea; improved with fluids  Anemia D/c Hb 8.2, stool was heme neg and pt is on iron  Hypertension Hydralazine, coreg, norvasc;no longer on ACE    Hennie Duos, MD

## 2013-10-28 NOTE — Assessment & Plan Note (Signed)
Was repleted in hospital and pt sent out on KCL 40 meq TID and unfortunately K+ today is 5.6; K+ dec to 40 meq BID and repeat BMP 5/29

## 2013-10-28 NOTE — Assessment & Plan Note (Signed)
Prerenal from dehydration 2/2 poor po intake and diarrhea; improved with fluids

## 2013-10-28 NOTE — Assessment & Plan Note (Signed)
D/c Hb 8.2, stool was heme neg and pt is on iron

## 2013-10-28 NOTE — Assessment & Plan Note (Signed)
Hydralazine, coreg, norvasc;no longer on ACE

## 2013-11-25 DIAGNOSIS — Z66 Do not resuscitate: Secondary | ICD-10-CM | POA: Diagnosis present

## 2013-11-25 DIAGNOSIS — K56609 Unspecified intestinal obstruction, unspecified as to partial versus complete obstruction: Secondary | ICD-10-CM | POA: Diagnosis not present

## 2013-11-25 DIAGNOSIS — J90 Pleural effusion, not elsewhere classified: Secondary | ICD-10-CM | POA: Diagnosis present

## 2013-11-25 DIAGNOSIS — Z7902 Long term (current) use of antithrombotics/antiplatelets: Secondary | ICD-10-CM | POA: Diagnosis not present

## 2013-11-25 DIAGNOSIS — G20A1 Parkinson's disease without dyskinesia, without mention of fluctuations: Secondary | ICD-10-CM | POA: Diagnosis not present

## 2013-11-25 DIAGNOSIS — I4891 Unspecified atrial fibrillation: Secondary | ICD-10-CM | POA: Diagnosis present

## 2013-11-25 DIAGNOSIS — R188 Other ascites: Secondary | ICD-10-CM | POA: Diagnosis not present

## 2013-11-25 DIAGNOSIS — B351 Tinea unguium: Secondary | ICD-10-CM | POA: Diagnosis not present

## 2013-11-25 DIAGNOSIS — K219 Gastro-esophageal reflux disease without esophagitis: Secondary | ICD-10-CM | POA: Diagnosis present

## 2013-11-25 DIAGNOSIS — R5381 Other malaise: Secondary | ICD-10-CM | POA: Diagnosis not present

## 2013-11-25 DIAGNOSIS — R1110 Vomiting, unspecified: Secondary | ICD-10-CM | POA: Diagnosis not present

## 2013-11-25 DIAGNOSIS — R609 Edema, unspecified: Secondary | ICD-10-CM | POA: Diagnosis present

## 2013-11-25 DIAGNOSIS — A0472 Enterocolitis due to Clostridium difficile, not specified as recurrent: Secondary | ICD-10-CM | POA: Diagnosis present

## 2013-11-25 DIAGNOSIS — G20 Parkinson's disease: Secondary | ICD-10-CM | POA: Diagnosis not present

## 2013-11-25 DIAGNOSIS — I509 Heart failure, unspecified: Secondary | ICD-10-CM | POA: Diagnosis present

## 2013-11-25 DIAGNOSIS — Z886 Allergy status to analgesic agent status: Secondary | ICD-10-CM | POA: Diagnosis not present

## 2013-11-25 DIAGNOSIS — N390 Urinary tract infection, site not specified: Secondary | ICD-10-CM | POA: Diagnosis not present

## 2013-11-25 DIAGNOSIS — I5031 Acute diastolic (congestive) heart failure: Secondary | ICD-10-CM | POA: Diagnosis not present

## 2013-11-25 DIAGNOSIS — F411 Generalized anxiety disorder: Secondary | ICD-10-CM | POA: Diagnosis present

## 2013-11-25 DIAGNOSIS — I5033 Acute on chronic diastolic (congestive) heart failure: Secondary | ICD-10-CM | POA: Diagnosis present

## 2013-11-25 DIAGNOSIS — E785 Hyperlipidemia, unspecified: Secondary | ICD-10-CM | POA: Diagnosis present

## 2013-11-25 DIAGNOSIS — I129 Hypertensive chronic kidney disease with stage 1 through stage 4 chronic kidney disease, or unspecified chronic kidney disease: Secondary | ICD-10-CM | POA: Diagnosis present

## 2013-11-25 DIAGNOSIS — Z5189 Encounter for other specified aftercare: Secondary | ICD-10-CM | POA: Diagnosis not present

## 2013-11-25 DIAGNOSIS — M79609 Pain in unspecified limb: Secondary | ICD-10-CM | POA: Diagnosis not present

## 2013-11-25 DIAGNOSIS — Z9089 Acquired absence of other organs: Secondary | ICD-10-CM | POA: Diagnosis not present

## 2013-11-25 DIAGNOSIS — I5032 Chronic diastolic (congestive) heart failure: Secondary | ICD-10-CM | POA: Diagnosis not present

## 2013-11-25 DIAGNOSIS — M2010 Hallux valgus (acquired), unspecified foot: Secondary | ICD-10-CM | POA: Diagnosis not present

## 2013-11-25 DIAGNOSIS — J811 Chronic pulmonary edema: Secondary | ICD-10-CM | POA: Diagnosis not present

## 2013-11-25 DIAGNOSIS — Z96649 Presence of unspecified artificial hip joint: Secondary | ICD-10-CM | POA: Diagnosis not present

## 2013-11-25 DIAGNOSIS — G4700 Insomnia, unspecified: Secondary | ICD-10-CM | POA: Diagnosis not present

## 2013-11-25 DIAGNOSIS — Z87891 Personal history of nicotine dependence: Secondary | ICD-10-CM | POA: Diagnosis not present

## 2013-11-25 DIAGNOSIS — E860 Dehydration: Secondary | ICD-10-CM | POA: Diagnosis not present

## 2013-11-25 DIAGNOSIS — Z79899 Other long term (current) drug therapy: Secondary | ICD-10-CM | POA: Diagnosis not present

## 2013-11-25 DIAGNOSIS — Z7982 Long term (current) use of aspirin: Secondary | ICD-10-CM | POA: Diagnosis not present

## 2013-11-25 DIAGNOSIS — Z1331 Encounter for screening for depression: Secondary | ICD-10-CM | POA: Diagnosis not present

## 2013-11-25 DIAGNOSIS — I9589 Other hypotension: Secondary | ICD-10-CM | POA: Diagnosis not present

## 2013-11-25 DIAGNOSIS — F3289 Other specified depressive episodes: Secondary | ICD-10-CM | POA: Diagnosis present

## 2013-11-25 DIAGNOSIS — M6281 Muscle weakness (generalized): Secondary | ICD-10-CM | POA: Diagnosis not present

## 2013-11-25 DIAGNOSIS — F0390 Unspecified dementia without behavioral disturbance: Secondary | ICD-10-CM | POA: Diagnosis not present

## 2013-11-25 DIAGNOSIS — Z91199 Patient's noncompliance with other medical treatment and regimen due to unspecified reason: Secondary | ICD-10-CM | POA: Diagnosis not present

## 2013-11-25 DIAGNOSIS — D696 Thrombocytopenia, unspecified: Secondary | ICD-10-CM | POA: Diagnosis not present

## 2013-11-25 DIAGNOSIS — R0602 Shortness of breath: Secondary | ICD-10-CM | POA: Diagnosis present

## 2013-11-25 DIAGNOSIS — N179 Acute kidney failure, unspecified: Secondary | ICD-10-CM | POA: Diagnosis present

## 2013-11-25 DIAGNOSIS — O10019 Pre-existing essential hypertension complicating pregnancy, unspecified trimester: Secondary | ICD-10-CM | POA: Diagnosis not present

## 2013-11-25 DIAGNOSIS — J9620 Acute and chronic respiratory failure, unspecified whether with hypoxia or hypercapnia: Secondary | ICD-10-CM | POA: Diagnosis present

## 2013-11-25 DIAGNOSIS — N183 Chronic kidney disease, stage 3 unspecified: Secondary | ICD-10-CM | POA: Diagnosis present

## 2013-11-25 DIAGNOSIS — F0280 Dementia in other diseases classified elsewhere without behavioral disturbance: Secondary | ICD-10-CM | POA: Diagnosis present

## 2013-11-25 DIAGNOSIS — E876 Hypokalemia: Secondary | ICD-10-CM | POA: Diagnosis not present

## 2013-11-25 DIAGNOSIS — Z9981 Dependence on supplemental oxygen: Secondary | ICD-10-CM | POA: Diagnosis not present

## 2013-11-25 DIAGNOSIS — F03918 Unspecified dementia, unspecified severity, with other behavioral disturbance: Secondary | ICD-10-CM | POA: Diagnosis not present

## 2013-11-25 DIAGNOSIS — R141 Gas pain: Secondary | ICD-10-CM | POA: Diagnosis not present

## 2013-11-25 DIAGNOSIS — R404 Transient alteration of awareness: Secondary | ICD-10-CM | POA: Diagnosis not present

## 2013-11-25 DIAGNOSIS — Z8672 Personal history of thrombophlebitis: Secondary | ICD-10-CM | POA: Diagnosis not present

## 2013-11-25 DIAGNOSIS — Z860100 Personal history of colon polyps, unspecified: Secondary | ICD-10-CM | POA: Diagnosis not present

## 2013-11-25 DIAGNOSIS — N171 Acute kidney failure with acute cortical necrosis: Secondary | ICD-10-CM | POA: Diagnosis not present

## 2013-11-25 DIAGNOSIS — D6489 Other specified anemias: Secondary | ICD-10-CM | POA: Diagnosis not present

## 2013-11-25 DIAGNOSIS — H579 Unspecified disorder of eye and adnexa: Secondary | ICD-10-CM | POA: Diagnosis not present

## 2013-11-25 DIAGNOSIS — E119 Type 2 diabetes mellitus without complications: Secondary | ICD-10-CM | POA: Diagnosis present

## 2013-11-25 DIAGNOSIS — D649 Anemia, unspecified: Secondary | ICD-10-CM | POA: Diagnosis not present

## 2013-11-25 DIAGNOSIS — Z515 Encounter for palliative care: Secondary | ICD-10-CM | POA: Diagnosis not present

## 2013-11-25 DIAGNOSIS — K562 Volvulus: Secondary | ICD-10-CM | POA: Diagnosis not present

## 2013-11-25 DIAGNOSIS — Z7401 Bed confinement status: Secondary | ICD-10-CM | POA: Diagnosis not present

## 2013-11-25 DIAGNOSIS — E46 Unspecified protein-calorie malnutrition: Secondary | ICD-10-CM | POA: Diagnosis not present

## 2013-11-25 DIAGNOSIS — I2510 Atherosclerotic heart disease of native coronary artery without angina pectoris: Secondary | ICD-10-CM | POA: Diagnosis present

## 2013-11-25 DIAGNOSIS — I10 Essential (primary) hypertension: Secondary | ICD-10-CM | POA: Diagnosis not present

## 2013-11-26 DIAGNOSIS — E876 Hypokalemia: Secondary | ICD-10-CM | POA: Diagnosis not present

## 2013-11-26 DIAGNOSIS — I4891 Unspecified atrial fibrillation: Secondary | ICD-10-CM | POA: Diagnosis not present

## 2013-11-26 DIAGNOSIS — E46 Unspecified protein-calorie malnutrition: Secondary | ICD-10-CM | POA: Diagnosis not present

## 2013-11-26 DIAGNOSIS — Z5189 Encounter for other specified aftercare: Secondary | ICD-10-CM | POA: Diagnosis not present

## 2013-11-26 DIAGNOSIS — D649 Anemia, unspecified: Secondary | ICD-10-CM | POA: Diagnosis not present

## 2013-11-26 DIAGNOSIS — A0472 Enterocolitis due to Clostridium difficile, not specified as recurrent: Secondary | ICD-10-CM | POA: Diagnosis not present

## 2013-11-27 DIAGNOSIS — I4891 Unspecified atrial fibrillation: Secondary | ICD-10-CM | POA: Diagnosis not present

## 2013-11-27 DIAGNOSIS — D649 Anemia, unspecified: Secondary | ICD-10-CM | POA: Diagnosis not present

## 2013-11-27 DIAGNOSIS — E46 Unspecified protein-calorie malnutrition: Secondary | ICD-10-CM | POA: Diagnosis not present

## 2013-11-27 DIAGNOSIS — E876 Hypokalemia: Secondary | ICD-10-CM | POA: Diagnosis not present

## 2013-11-27 DIAGNOSIS — A0472 Enterocolitis due to Clostridium difficile, not specified as recurrent: Secondary | ICD-10-CM | POA: Diagnosis not present

## 2013-11-27 DIAGNOSIS — Z5189 Encounter for other specified aftercare: Secondary | ICD-10-CM | POA: Diagnosis not present

## 2013-11-28 DIAGNOSIS — D649 Anemia, unspecified: Secondary | ICD-10-CM | POA: Diagnosis not present

## 2013-11-28 DIAGNOSIS — E46 Unspecified protein-calorie malnutrition: Secondary | ICD-10-CM | POA: Diagnosis not present

## 2013-11-28 DIAGNOSIS — E876 Hypokalemia: Secondary | ICD-10-CM | POA: Diagnosis not present

## 2013-11-28 DIAGNOSIS — Z5189 Encounter for other specified aftercare: Secondary | ICD-10-CM | POA: Diagnosis not present

## 2013-11-28 DIAGNOSIS — I4891 Unspecified atrial fibrillation: Secondary | ICD-10-CM | POA: Diagnosis not present

## 2013-11-28 DIAGNOSIS — A0472 Enterocolitis due to Clostridium difficile, not specified as recurrent: Secondary | ICD-10-CM | POA: Diagnosis not present

## 2013-12-01 DIAGNOSIS — Z5189 Encounter for other specified aftercare: Secondary | ICD-10-CM | POA: Diagnosis not present

## 2013-12-01 DIAGNOSIS — E46 Unspecified protein-calorie malnutrition: Secondary | ICD-10-CM | POA: Diagnosis not present

## 2013-12-01 DIAGNOSIS — E876 Hypokalemia: Secondary | ICD-10-CM | POA: Diagnosis not present

## 2013-12-01 DIAGNOSIS — A0472 Enterocolitis due to Clostridium difficile, not specified as recurrent: Secondary | ICD-10-CM | POA: Diagnosis not present

## 2013-12-01 DIAGNOSIS — I4891 Unspecified atrial fibrillation: Secondary | ICD-10-CM | POA: Diagnosis not present

## 2013-12-01 DIAGNOSIS — D649 Anemia, unspecified: Secondary | ICD-10-CM | POA: Diagnosis not present

## 2013-12-02 DIAGNOSIS — D649 Anemia, unspecified: Secondary | ICD-10-CM | POA: Diagnosis not present

## 2013-12-02 DIAGNOSIS — E876 Hypokalemia: Secondary | ICD-10-CM | POA: Diagnosis not present

## 2013-12-02 DIAGNOSIS — A0472 Enterocolitis due to Clostridium difficile, not specified as recurrent: Secondary | ICD-10-CM | POA: Diagnosis not present

## 2013-12-02 DIAGNOSIS — I4891 Unspecified atrial fibrillation: Secondary | ICD-10-CM | POA: Diagnosis not present

## 2013-12-02 DIAGNOSIS — Z5189 Encounter for other specified aftercare: Secondary | ICD-10-CM | POA: Diagnosis not present

## 2013-12-02 DIAGNOSIS — E46 Unspecified protein-calorie malnutrition: Secondary | ICD-10-CM | POA: Diagnosis not present

## 2013-12-03 DIAGNOSIS — Z886 Allergy status to analgesic agent status: Secondary | ICD-10-CM | POA: Diagnosis not present

## 2013-12-03 DIAGNOSIS — Z8672 Personal history of thrombophlebitis: Secondary | ICD-10-CM | POA: Diagnosis not present

## 2013-12-03 DIAGNOSIS — F329 Major depressive disorder, single episode, unspecified: Secondary | ICD-10-CM | POA: Diagnosis present

## 2013-12-03 DIAGNOSIS — Z9981 Dependence on supplemental oxygen: Secondary | ICD-10-CM | POA: Diagnosis not present

## 2013-12-03 DIAGNOSIS — F0280 Dementia in other diseases classified elsewhere without behavioral disturbance: Secondary | ICD-10-CM | POA: Diagnosis present

## 2013-12-03 DIAGNOSIS — K562 Volvulus: Secondary | ICD-10-CM | POA: Diagnosis not present

## 2013-12-03 DIAGNOSIS — B351 Tinea unguium: Secondary | ICD-10-CM | POA: Diagnosis not present

## 2013-12-03 DIAGNOSIS — E119 Type 2 diabetes mellitus without complications: Secondary | ICD-10-CM | POA: Diagnosis present

## 2013-12-03 DIAGNOSIS — Z79899 Other long term (current) drug therapy: Secondary | ICD-10-CM | POA: Diagnosis not present

## 2013-12-03 DIAGNOSIS — Z9089 Acquired absence of other organs: Secondary | ICD-10-CM | POA: Diagnosis not present

## 2013-12-03 DIAGNOSIS — Z9119 Patient's noncompliance with other medical treatment and regimen: Secondary | ICD-10-CM | POA: Diagnosis not present

## 2013-12-03 DIAGNOSIS — E876 Hypokalemia: Secondary | ICD-10-CM | POA: Diagnosis not present

## 2013-12-03 DIAGNOSIS — O10019 Pre-existing essential hypertension complicating pregnancy, unspecified trimester: Secondary | ICD-10-CM | POA: Diagnosis not present

## 2013-12-03 DIAGNOSIS — E785 Hyperlipidemia, unspecified: Secondary | ICD-10-CM | POA: Diagnosis present

## 2013-12-03 DIAGNOSIS — I2510 Atherosclerotic heart disease of native coronary artery without angina pectoris: Secondary | ICD-10-CM | POA: Diagnosis present

## 2013-12-03 DIAGNOSIS — H579 Unspecified disorder of eye and adnexa: Secondary | ICD-10-CM | POA: Diagnosis not present

## 2013-12-03 DIAGNOSIS — R0602 Shortness of breath: Secondary | ICD-10-CM | POA: Diagnosis present

## 2013-12-03 DIAGNOSIS — I9589 Other hypotension: Secondary | ICD-10-CM | POA: Diagnosis not present

## 2013-12-03 DIAGNOSIS — M6281 Muscle weakness (generalized): Secondary | ICD-10-CM | POA: Diagnosis not present

## 2013-12-03 DIAGNOSIS — R609 Edema, unspecified: Secondary | ICD-10-CM | POA: Diagnosis present

## 2013-12-03 DIAGNOSIS — I5031 Acute diastolic (congestive) heart failure: Secondary | ICD-10-CM | POA: Diagnosis not present

## 2013-12-03 DIAGNOSIS — I5032 Chronic diastolic (congestive) heart failure: Secondary | ICD-10-CM | POA: Diagnosis not present

## 2013-12-03 DIAGNOSIS — I509 Heart failure, unspecified: Secondary | ICD-10-CM | POA: Diagnosis present

## 2013-12-03 DIAGNOSIS — I129 Hypertensive chronic kidney disease with stage 1 through stage 4 chronic kidney disease, or unspecified chronic kidney disease: Secondary | ICD-10-CM | POA: Diagnosis present

## 2013-12-03 DIAGNOSIS — D649 Anemia, unspecified: Secondary | ICD-10-CM | POA: Diagnosis not present

## 2013-12-03 DIAGNOSIS — Z5189 Encounter for other specified aftercare: Secondary | ICD-10-CM | POA: Diagnosis not present

## 2013-12-03 DIAGNOSIS — K56609 Unspecified intestinal obstruction, unspecified as to partial versus complete obstruction: Secondary | ICD-10-CM | POA: Diagnosis not present

## 2013-12-03 DIAGNOSIS — N179 Acute kidney failure, unspecified: Secondary | ICD-10-CM | POA: Diagnosis present

## 2013-12-03 DIAGNOSIS — R404 Transient alteration of awareness: Secondary | ICD-10-CM | POA: Diagnosis not present

## 2013-12-03 DIAGNOSIS — Z515 Encounter for palliative care: Secondary | ICD-10-CM | POA: Diagnosis not present

## 2013-12-03 DIAGNOSIS — F411 Generalized anxiety disorder: Secondary | ICD-10-CM | POA: Diagnosis present

## 2013-12-03 DIAGNOSIS — R5381 Other malaise: Secondary | ICD-10-CM | POA: Diagnosis not present

## 2013-12-03 DIAGNOSIS — E860 Dehydration: Secondary | ICD-10-CM | POA: Diagnosis not present

## 2013-12-03 DIAGNOSIS — M2010 Hallux valgus (acquired), unspecified foot: Secondary | ICD-10-CM | POA: Diagnosis not present

## 2013-12-03 DIAGNOSIS — R1110 Vomiting, unspecified: Secondary | ICD-10-CM | POA: Diagnosis not present

## 2013-12-03 DIAGNOSIS — R188 Other ascites: Secondary | ICD-10-CM | POA: Diagnosis not present

## 2013-12-03 DIAGNOSIS — G4700 Insomnia, unspecified: Secondary | ICD-10-CM | POA: Diagnosis not present

## 2013-12-03 DIAGNOSIS — F0391 Unspecified dementia with behavioral disturbance: Secondary | ICD-10-CM | POA: Diagnosis not present

## 2013-12-03 DIAGNOSIS — M79609 Pain in unspecified limb: Secondary | ICD-10-CM | POA: Diagnosis not present

## 2013-12-03 DIAGNOSIS — Z66 Do not resuscitate: Secondary | ICD-10-CM | POA: Diagnosis present

## 2013-12-03 DIAGNOSIS — J9620 Acute and chronic respiratory failure, unspecified whether with hypoxia or hypercapnia: Secondary | ICD-10-CM | POA: Diagnosis present

## 2013-12-03 DIAGNOSIS — N390 Urinary tract infection, site not specified: Secondary | ICD-10-CM | POA: Diagnosis not present

## 2013-12-03 DIAGNOSIS — I10 Essential (primary) hypertension: Secondary | ICD-10-CM | POA: Diagnosis not present

## 2013-12-03 DIAGNOSIS — J811 Chronic pulmonary edema: Secondary | ICD-10-CM | POA: Diagnosis not present

## 2013-12-03 DIAGNOSIS — Z7401 Bed confinement status: Secondary | ICD-10-CM | POA: Diagnosis not present

## 2013-12-03 DIAGNOSIS — I5033 Acute on chronic diastolic (congestive) heart failure: Secondary | ICD-10-CM | POA: Diagnosis present

## 2013-12-03 DIAGNOSIS — Z7902 Long term (current) use of antithrombotics/antiplatelets: Secondary | ICD-10-CM | POA: Diagnosis not present

## 2013-12-03 DIAGNOSIS — A0472 Enterocolitis due to Clostridium difficile, not specified as recurrent: Secondary | ICD-10-CM | POA: Diagnosis present

## 2013-12-03 DIAGNOSIS — Z1331 Encounter for screening for depression: Secondary | ICD-10-CM | POA: Diagnosis not present

## 2013-12-03 DIAGNOSIS — F0390 Unspecified dementia without behavioral disturbance: Secondary | ICD-10-CM | POA: Diagnosis not present

## 2013-12-03 DIAGNOSIS — D6489 Other specified anemias: Secondary | ICD-10-CM | POA: Diagnosis not present

## 2013-12-03 DIAGNOSIS — G20 Parkinson's disease: Secondary | ICD-10-CM | POA: Diagnosis not present

## 2013-12-03 DIAGNOSIS — J90 Pleural effusion, not elsewhere classified: Secondary | ICD-10-CM | POA: Diagnosis present

## 2013-12-03 DIAGNOSIS — E46 Unspecified protein-calorie malnutrition: Secondary | ICD-10-CM | POA: Diagnosis not present

## 2013-12-03 DIAGNOSIS — Z7982 Long term (current) use of aspirin: Secondary | ICD-10-CM | POA: Diagnosis not present

## 2013-12-03 DIAGNOSIS — R141 Gas pain: Secondary | ICD-10-CM | POA: Diagnosis not present

## 2013-12-03 DIAGNOSIS — I4891 Unspecified atrial fibrillation: Secondary | ICD-10-CM | POA: Diagnosis present

## 2013-12-03 DIAGNOSIS — Z87891 Personal history of nicotine dependence: Secondary | ICD-10-CM | POA: Diagnosis not present

## 2013-12-03 DIAGNOSIS — D696 Thrombocytopenia, unspecified: Secondary | ICD-10-CM | POA: Diagnosis not present

## 2013-12-03 DIAGNOSIS — K219 Gastro-esophageal reflux disease without esophagitis: Secondary | ICD-10-CM | POA: Diagnosis present

## 2013-12-03 DIAGNOSIS — Z86010 Personal history of colonic polyps: Secondary | ICD-10-CM | POA: Diagnosis not present

## 2013-12-03 DIAGNOSIS — N171 Acute kidney failure with acute cortical necrosis: Secondary | ICD-10-CM | POA: Diagnosis not present

## 2013-12-03 DIAGNOSIS — Z96649 Presence of unspecified artificial hip joint: Secondary | ICD-10-CM | POA: Diagnosis not present

## 2013-12-10 ENCOUNTER — Encounter: Payer: Self-pay | Admitting: Internal Medicine

## 2013-12-10 ENCOUNTER — Non-Acute Institutional Stay (SKILLED_NURSING_FACILITY): Payer: Medicare Other | Admitting: Internal Medicine

## 2013-12-10 DIAGNOSIS — I5032 Chronic diastolic (congestive) heart failure: Secondary | ICD-10-CM | POA: Diagnosis not present

## 2013-12-10 DIAGNOSIS — R627 Adult failure to thrive: Secondary | ICD-10-CM

## 2013-12-10 DIAGNOSIS — I509 Heart failure, unspecified: Secondary | ICD-10-CM

## 2013-12-10 DIAGNOSIS — A0472 Enterocolitis due to Clostridium difficile, not specified as recurrent: Secondary | ICD-10-CM | POA: Diagnosis not present

## 2013-12-10 DIAGNOSIS — H5789 Other specified disorders of eye and adnexa: Secondary | ICD-10-CM

## 2013-12-10 DIAGNOSIS — Z91199 Patient's noncompliance with other medical treatment and regimen due to unspecified reason: Secondary | ICD-10-CM | POA: Diagnosis not present

## 2013-12-10 DIAGNOSIS — Z9119 Patient's noncompliance with other medical treatment and regimen: Secondary | ICD-10-CM | POA: Diagnosis not present

## 2013-12-10 DIAGNOSIS — F329 Major depressive disorder, single episode, unspecified: Secondary | ICD-10-CM

## 2013-12-10 DIAGNOSIS — H579 Unspecified disorder of eye and adnexa: Secondary | ICD-10-CM | POA: Diagnosis not present

## 2013-12-10 DIAGNOSIS — F3289 Other specified depressive episodes: Secondary | ICD-10-CM

## 2013-12-10 DIAGNOSIS — F32A Depression, unspecified: Secondary | ICD-10-CM

## 2013-12-10 DIAGNOSIS — F0390 Unspecified dementia without behavioral disturbance: Secondary | ICD-10-CM

## 2013-12-10 DIAGNOSIS — E1159 Type 2 diabetes mellitus with other circulatory complications: Secondary | ICD-10-CM

## 2013-12-10 NOTE — Progress Notes (Signed)
Patient ID: Maria Christian, female   DOB: 1924/10/17, 80 y.o.   MRN: [id number]  Location:  [facility] Provider:  Rexene Edison. [name], D.O., C.M.D.  Code Status:  Full code; has guardian Jerolyn Center [phone]  Chief Complaint  Patient presents with  . Medical Management of Chronic Issues    staff also note she is refusing her eyedrops    HPI:  80 yo white female with h/o volvulus, recent treatment for c diff colitis, afib with RVR, CAD, chronic diastolic chf, pulmonary htn, diabetes, depression, moderate malnutrition, and failure to thrive was seen today for medical mgt of chronic diseases.  Staff note that recently she has been refusing her eyedrops.  Upon review with the pharmacist, it is only some of the eyedrops namely the zaditor (known to burn sometimes) and systane some of the time.  We also reviewed her prn meds and discovered that several are not being used.  Review of weights appears they have stabilized.    When seen, she was doing well.  Her only complaint is blurring of her vision with some of her eyedrops.   Told me the story about her hospitalization which was over her birthday when her family had a big party for her--showed me the book they made for her.    Review of Systems:  Review of Systems  Constitutional: Negative for fever and chills.  HENT: Negative for congestion.   Eyes: Positive for blurred vision.  Respiratory: Negative for shortness of breath.   Cardiovascular: Negative for chest pain and leg swelling.  Gastrointestinal: Negative for abdominal pain, diarrhea, constipation, blood in stool and melena.  Genitourinary: Negative for dysuria.  Musculoskeletal: Negative for falls and myalgias.  Skin: Negative for rash.  Neurological: Negative for dizziness, loss of consciousness and headaches.  Endo/Heme/Allergies: Does not bruise/bleed easily.  Psychiatric/Behavioral: Positive for memory loss. Negative for depression.       Mild memory loss      Medications: Patient's Medications  New Prescriptions   No medications on file  Previous Medications   ACETAMINOPHEN (TYLENOL) 500 MG TABLET    Take 1,000 mg by mouth 3 (three) times daily.   AMLODIPINE (NORVASC) 5 MG TABLET    Take 5 mg by mouth daily.   ASPIRIN 81 MG CHEWABLE TABLET    Chew 81 mg by mouth daily.   CARVEDILOL (COREG) 3.125 MG TABLET    Take 3.125 mg by mouth daily.   CHOLECALCIFEROL (VITAMIN D) 1000 UNITS TABLET    Take 1,000 Units by mouth daily.   CLOPIDOGREL (PLAVIX) 75 MG TABLET    Take 75 mg by mouth daily.    CRANBERRY 475 MG CAPS    Take 1 capsule by mouth 2 (two) times daily.   CYCLOBENZAPRINE (FLEXERIL) 5 MG TABLET    Take 5 mg by mouth at bedtime.   DICLOFENAC SODIUM (VOLTAREN) 1 % GEL    Apply 2 g topically 2 (two) times daily as needed (left neck pain).    DONEPEZIL (ARICEPT) 5 MG TABLET    Take 5 mg by mouth at bedtime.   DORZOLAMIDE (TRUSOPT) 2 % OPHTHALMIC SOLUTION    Place 1 drop into both eyes 3 (three) times daily.    FERROUS SULFATE 325 (65 FE) MG TABLET    Take 325 mg by mouth daily with breakfast.   GABAPENTIN (NEURONTIN) 300 MG CAPSULE    Take 300 mg by mouth 2 (two) times daily.   HYDRALAZINE (APRESOLINE) 10 MG TABLET  Take 10 mg by mouth 3 (three) times daily. HOLD if SBP <100/60 and pulse is less than 60   LIDOCAINE (LIDODERM) 5 %    Place 1 patch onto the skin daily. Apply 1 patch to left lower back/hip every morning.  Remove & Discard patch within 12 hours or as directed by MD   LORATADINE (CLARITIN) 10 MG TABLET    Take 10 mg by mouth daily.   MULTIPLE VITAMINS-MINERALS (CERTAGEN PO)    Take 1 tablet by mouth daily.    PRAVASTATIN (PRAVACHOL) 20 MG TABLET    Take 20 mg by mouth at bedtime.    SERTRALINE (ZOLOFT) 50 MG TABLET    Take 50 mg by mouth daily.    TRAVOPROST, BAK FREE, (TRAVATAN) 0.004 % SOLN OPHTHALMIC SOLUTION    Place 2 drops into both eyes at bedtime.    TRAZODONE (DESYREL) 50 MG TABLET    Take 100 mg by mouth at bedtime.     VITAMIN B-12 (CYANOCOBALAMIN) 500 MCG TABLET    Take 500 mcg by mouth daily.   VITAMIN C (ASCORBIC ACID) 500 MG TABLET    Take 500 mg by mouth daily.  Modified Medications   Modified Medication Previous Medication   POTASSIUM CHLORIDE SA (K-DUR,KLOR-CON) 20 MEQ TABLET potassium chloride SA (K-DUR,KLOR-CON) 20 MEQ tablet      Take 20 mEq by mouth daily.    Take 3 tablets (60 mEq total) by mouth 3 (three) times daily.  Discontinued Medications   ALPRAZOLAM (XANAX) 0.25 MG TABLET    Take one tablet by mouth once daily at 2pm; Take one tablet by mouth at bedtime as needed; Take one tablet by mouth every 12 hours as needed   DEXTROMETHORPHAN-GUAIFENESIN (MUCINEX DM) 30-600 MG PER 12 HR TABLET    Take 1 tablet by mouth 2 (two) times daily as needed for cough.    IBUPROFEN (ADVIL,MOTRIN) 400 MG TABLET    Take 400 mg by mouth every 6 (six) hours as needed for moderate pain.    KETOTIFEN (ZADITOR) 0.025 % OPHTHALMIC SOLUTION    Place 1 drop into both eyes 2 (two) times daily.   METRONIDAZOLE (FLAGYL) 500 MG TABLET    Take 1 tablet (500 mg total) by mouth every 8 (eight) hours.   ONDANSETRON (ZOFRAN) 4 MG TABLET    Take 4 mg by mouth every 8 (eight) hours as needed for nausea or vomiting.   POLYETHYL GLYCOL-PROPYL GLYCOL (SYSTANE OP)    Place 1 drop into both eyes 4 (four) times daily.   SODIUM BICARBONATE 650 MG TABLET    Take 650 mg by mouth 2 (two) times daily.    Physical Exam: Filed Vitals:   12/10/13 1455  BP: 116/68  Pulse: 92  Temp: 98.2 F (36.8 C)  Resp: 17  Height: 5\' 8"  (1.727 m)  Weight: 158 lb (71.668 kg)  SpO2: 98%  Physical Exam  Constitutional: She is oriented to person, place, and time. She appears well-developed and well-nourished.  HENT:  Head: Normocephalic and atraumatic.  Eyes:  Vision poor, wearing glasses  Cardiovascular: Normal rate, regular rhythm, normal heart sounds and intact distal pulses.   Pulmonary/Chest: Effort normal and breath sounds normal. No  respiratory distress.  Abdominal: Soft. Bowel sounds are normal. She exhibits no distension and no mass. There is no tenderness.  Musculoskeletal: Normal range of motion. She exhibits no edema and no tenderness.  Neurological: She is alert and oriented to person, place, and time.  Skin: Skin is warm  and dry.  Psychiatric: She has a normal mood and affect.    Labs reviewed: Basic Metabolic Panel:  Recent Labs  10/21/13 0335  10/22/13 0534 10/22/13 1354 10/23/13 0438  NA 143  --  144  --  141  K 2.8*  < > 3.1* 3.2* 3.3*  CL 115*  --  116*  --  114*  CO2 17*  --  16*  --  16*  GLUCOSE 124*  --  129*  --  133*  BUN <3*  --  <3*  --  <3*  CREATININE 0.93  --  1.00  --  0.93  CALCIUM 8.4  --  8.4  --  8.4  MG 1.6  --  1.6  --  2.3  < > = values in this interval not displayed.  Liver Function Tests:  Recent Labs  07/24/13 2028 07/26/13 0527 10/09/13 1906  AST 45* 31 20  ALT 37* 29 10  ALKPHOS 61 73 72  BILITOT <0.2* 0.3 0.2*  PROT 6.0 6.7 6.1  ALBUMIN 3.0* 3.3* 3.1*    CBC:  Recent Labs  07/24/13 2028  10/09/13 1906  10/14/13 0445 10/17/13 1000 10/19/13 0435  WBC 5.0  < > 5.8  < > 5.4 5.6 5.2  NEUTROABS 3.1  --  4.2  --   --  3.8  --   HGB 8.9*  < > 7.8*  < > 7.4* 8.2* 8.1*  HCT 27.0*  < > 23.3*  < > 21.7* 23.8* 23.6*  MCV 104.2*  < > 99.6  < > 97.3 98.3 100.4*  PLT 111*  < > 133*  < > 157 177 173  < > = values in this interval not displayed. 11/15/13:  cdiff positive (completed flagyl) 11/21/13:  Na 141, K 3.6, BUN 10, cr 0.72, glucose 126, alb 3.2 Assessment/Plan 1. Eye irritation - has refused some eyedrops due to irritation--did discontinue some of these to help--hopefully will improve  2. Patient nonadherence -due to blurring of vision from some eye drops--I made changes which will hopefully help  3. Enteritis due to Clostridium difficile -resolved, completed treatment  4. Diastolic CHF, chronic -cont coreg, stable now  5. Type 2 diabetes mellitus  with other circulatory complications -controlled with diet alone, cont asa, statin  6. Dementia, without behavioral disturbance -seems mild--remembers me each visit, very conversive and seems to recall a lot of what has happened to her even recent events  7. Depression -mood good at present, only on trazodone, sleeps well with it  8. FTT (failure to thrive) in adult -is back to eating well since  cdiff treated--overall improved and stabilized   Family/ staff Communication: discussed with nursing staff and pharmacist  Goals of care: long term care resident  Labs/tests ordered:  hba1c next draw

## 2013-12-27 ENCOUNTER — Emergency Department (HOSPITAL_COMMUNITY): Payer: Medicare Other

## 2013-12-27 ENCOUNTER — Inpatient Hospital Stay (HOSPITAL_COMMUNITY)
Admission: EM | Admit: 2013-12-27 | Discharge: 2014-01-05 | DRG: 291 | Disposition: A | Discharge status: Skilled Nursing Facility | Payer: Medicare Other | Attending: Internal Medicine | Admitting: Internal Medicine

## 2013-12-27 DIAGNOSIS — I5032 Chronic diastolic (congestive) heart failure: Secondary | ICD-10-CM | POA: Diagnosis not present

## 2013-12-27 DIAGNOSIS — K219 Gastro-esophageal reflux disease without esophagitis: Secondary | ICD-10-CM

## 2013-12-27 DIAGNOSIS — J90 Pleural effusion, not elsewhere classified: Secondary | ICD-10-CM | POA: Diagnosis present

## 2013-12-27 DIAGNOSIS — J9620 Acute and chronic respiratory failure, unspecified whether with hypoxia or hypercapnia: Secondary | ICD-10-CM | POA: Diagnosis not present

## 2013-12-27 DIAGNOSIS — Z7401 Bed confinement status: Secondary | ICD-10-CM | POA: Diagnosis not present

## 2013-12-27 DIAGNOSIS — R1110 Vomiting, unspecified: Secondary | ICD-10-CM | POA: Diagnosis not present

## 2013-12-27 DIAGNOSIS — Z9089 Acquired absence of other organs: Secondary | ICD-10-CM

## 2013-12-27 DIAGNOSIS — F329 Major depressive disorder, single episode, unspecified: Secondary | ICD-10-CM | POA: Diagnosis present

## 2013-12-27 DIAGNOSIS — R141 Gas pain: Secondary | ICD-10-CM | POA: Diagnosis not present

## 2013-12-27 DIAGNOSIS — F3289 Other specified depressive episodes: Secondary | ICD-10-CM | POA: Diagnosis present

## 2013-12-27 DIAGNOSIS — F411 Generalized anxiety disorder: Secondary | ICD-10-CM | POA: Diagnosis present

## 2013-12-27 DIAGNOSIS — Z886 Allergy status to analgesic agent status: Secondary | ICD-10-CM | POA: Diagnosis not present

## 2013-12-27 DIAGNOSIS — I509 Heart failure, unspecified: Secondary | ICD-10-CM | POA: Diagnosis present

## 2013-12-27 DIAGNOSIS — E860 Dehydration: Secondary | ICD-10-CM | POA: Diagnosis not present

## 2013-12-27 DIAGNOSIS — R609 Edema, unspecified: Secondary | ICD-10-CM | POA: Diagnosis present

## 2013-12-27 DIAGNOSIS — A0472 Enterocolitis due to Clostridium difficile, not specified as recurrent: Secondary | ICD-10-CM | POA: Diagnosis not present

## 2013-12-27 DIAGNOSIS — J811 Chronic pulmonary edema: Secondary | ICD-10-CM | POA: Diagnosis not present

## 2013-12-27 DIAGNOSIS — Z860100 Personal history of colon polyps, unspecified: Secondary | ICD-10-CM

## 2013-12-27 DIAGNOSIS — Z66 Do not resuscitate: Secondary | ICD-10-CM | POA: Diagnosis present

## 2013-12-27 DIAGNOSIS — J9621 Acute and chronic respiratory failure with hypoxia: Secondary | ICD-10-CM | POA: Diagnosis present

## 2013-12-27 DIAGNOSIS — I5033 Acute on chronic diastolic (congestive) heart failure: Principal | ICD-10-CM | POA: Diagnosis present

## 2013-12-27 DIAGNOSIS — Z87891 Personal history of nicotine dependence: Secondary | ICD-10-CM

## 2013-12-27 DIAGNOSIS — I10 Essential (primary) hypertension: Secondary | ICD-10-CM | POA: Diagnosis present

## 2013-12-27 DIAGNOSIS — Z79899 Other long term (current) drug therapy: Secondary | ICD-10-CM

## 2013-12-27 DIAGNOSIS — N183 Chronic kidney disease, stage 3 unspecified: Secondary | ICD-10-CM | POA: Diagnosis present

## 2013-12-27 DIAGNOSIS — Z8672 Personal history of thrombophlebitis: Secondary | ICD-10-CM

## 2013-12-27 DIAGNOSIS — E876 Hypokalemia: Secondary | ICD-10-CM | POA: Diagnosis not present

## 2013-12-27 DIAGNOSIS — N179 Acute kidney failure, unspecified: Secondary | ICD-10-CM | POA: Diagnosis present

## 2013-12-27 DIAGNOSIS — F03918 Unspecified dementia, unspecified severity, with other behavioral disturbance: Secondary | ICD-10-CM | POA: Diagnosis present

## 2013-12-27 DIAGNOSIS — E46 Unspecified protein-calorie malnutrition: Secondary | ICD-10-CM | POA: Diagnosis not present

## 2013-12-27 DIAGNOSIS — Z1331 Encounter for screening for depression: Secondary | ICD-10-CM | POA: Diagnosis not present

## 2013-12-27 DIAGNOSIS — E785 Hyperlipidemia, unspecified: Secondary | ICD-10-CM | POA: Diagnosis present

## 2013-12-27 DIAGNOSIS — F0280 Dementia in other diseases classified elsewhere without behavioral disturbance: Secondary | ICD-10-CM | POA: Diagnosis present

## 2013-12-27 DIAGNOSIS — O10019 Pre-existing essential hypertension complicating pregnancy, unspecified trimester: Secondary | ICD-10-CM | POA: Diagnosis not present

## 2013-12-27 DIAGNOSIS — R5381 Other malaise: Secondary | ICD-10-CM | POA: Diagnosis not present

## 2013-12-27 DIAGNOSIS — I5031 Acute diastolic (congestive) heart failure: Secondary | ICD-10-CM

## 2013-12-27 DIAGNOSIS — R0602 Shortness of breath: Secondary | ICD-10-CM | POA: Diagnosis present

## 2013-12-27 DIAGNOSIS — Z96649 Presence of unspecified artificial hip joint: Secondary | ICD-10-CM | POA: Diagnosis not present

## 2013-12-27 DIAGNOSIS — Z515 Encounter for palliative care: Secondary | ICD-10-CM | POA: Diagnosis not present

## 2013-12-27 DIAGNOSIS — N171 Acute kidney failure with acute cortical necrosis: Secondary | ICD-10-CM | POA: Diagnosis not present

## 2013-12-27 DIAGNOSIS — I129 Hypertensive chronic kidney disease with stage 1 through stage 4 chronic kidney disease, or unspecified chronic kidney disease: Secondary | ICD-10-CM | POA: Diagnosis present

## 2013-12-27 DIAGNOSIS — F0390 Unspecified dementia without behavioral disturbance: Secondary | ICD-10-CM | POA: Diagnosis not present

## 2013-12-27 DIAGNOSIS — E119 Type 2 diabetes mellitus without complications: Secondary | ICD-10-CM | POA: Diagnosis present

## 2013-12-27 DIAGNOSIS — E440 Moderate protein-calorie malnutrition: Secondary | ICD-10-CM

## 2013-12-27 DIAGNOSIS — Z7982 Long term (current) use of aspirin: Secondary | ICD-10-CM | POA: Diagnosis not present

## 2013-12-27 DIAGNOSIS — D696 Thrombocytopenia, unspecified: Secondary | ICD-10-CM | POA: Diagnosis not present

## 2013-12-27 DIAGNOSIS — R531 Weakness: Secondary | ICD-10-CM

## 2013-12-27 DIAGNOSIS — F0391 Unspecified dementia with behavioral disturbance: Secondary | ICD-10-CM | POA: Diagnosis present

## 2013-12-27 DIAGNOSIS — N390 Urinary tract infection, site not specified: Secondary | ICD-10-CM | POA: Diagnosis not present

## 2013-12-27 DIAGNOSIS — D6489 Other specified anemias: Secondary | ICD-10-CM | POA: Diagnosis not present

## 2013-12-27 DIAGNOSIS — G218 Other secondary parkinsonism: Secondary | ICD-10-CM | POA: Diagnosis not present

## 2013-12-27 DIAGNOSIS — Z9981 Dependence on supplemental oxygen: Secondary | ICD-10-CM | POA: Diagnosis not present

## 2013-12-27 DIAGNOSIS — K56609 Unspecified intestinal obstruction, unspecified as to partial versus complete obstruction: Secondary | ICD-10-CM | POA: Diagnosis not present

## 2013-12-27 DIAGNOSIS — R404 Transient alteration of awareness: Secondary | ICD-10-CM | POA: Diagnosis not present

## 2013-12-27 DIAGNOSIS — G3183 Dementia with Lewy bodies: Secondary | ICD-10-CM | POA: Diagnosis present

## 2013-12-27 DIAGNOSIS — I9589 Other hypotension: Secondary | ICD-10-CM | POA: Diagnosis not present

## 2013-12-27 DIAGNOSIS — Z7902 Long term (current) use of antithrombotics/antiplatelets: Secondary | ICD-10-CM

## 2013-12-27 DIAGNOSIS — K562 Volvulus: Secondary | ICD-10-CM | POA: Diagnosis not present

## 2013-12-27 DIAGNOSIS — F419 Anxiety disorder, unspecified: Secondary | ICD-10-CM | POA: Diagnosis not present

## 2013-12-27 DIAGNOSIS — G20 Parkinson's disease: Secondary | ICD-10-CM | POA: Diagnosis not present

## 2013-12-27 DIAGNOSIS — J9819 Other pulmonary collapse: Secondary | ICD-10-CM | POA: Diagnosis not present

## 2013-12-27 DIAGNOSIS — I4891 Unspecified atrial fibrillation: Secondary | ICD-10-CM | POA: Diagnosis present

## 2013-12-27 DIAGNOSIS — D649 Anemia, unspecified: Secondary | ICD-10-CM | POA: Diagnosis not present

## 2013-12-27 DIAGNOSIS — R1310 Dysphagia, unspecified: Secondary | ICD-10-CM | POA: Diagnosis not present

## 2013-12-27 DIAGNOSIS — I2510 Atherosclerotic heart disease of native coronary artery without angina pectoris: Secondary | ICD-10-CM | POA: Diagnosis present

## 2013-12-27 DIAGNOSIS — R188 Other ascites: Secondary | ICD-10-CM | POA: Diagnosis not present

## 2013-12-27 LAB — COMPREHENSIVE METABOLIC PANEL
ALT: 7 U/L (ref 0–35)
ANION GAP: 11 (ref 5–15)
AST: 15 U/L (ref 0–37)
Albumin: 3.7 g/dL (ref 3.5–5.2)
Alkaline Phosphatase: 82 U/L (ref 39–117)
BUN: 15 mg/dL (ref 6–23)
CALCIUM: 9.7 mg/dL (ref 8.4–10.5)
CO2: 27 mEq/L (ref 19–32)
Chloride: 103 mEq/L (ref 96–112)
Creatinine, Ser: 0.88 mg/dL (ref 0.50–1.10)
GFR calc non Af Amer: 57 mL/min — ABNORMAL LOW (ref >90)
GFR, EST AFRICAN AMERICAN: 67 mL/min — AB (ref >90)
GLUCOSE: 163 mg/dL — AB (ref 70–99)
POTASSIUM: 4.8 meq/L (ref 3.7–5.3)
Sodium: 141 mEq/L (ref 137–147)
Total Bilirubin: 0.3 mg/dL (ref 0.3–1.2)
Total Protein: 7.6 g/dL (ref 6.0–8.3)

## 2013-12-27 LAB — TYPE AND SCREEN
ABO/RH(D): O POS
ANTIBODY SCREEN: NEGATIVE

## 2013-12-27 LAB — I-STAT CG4 LACTIC ACID, ED: LACTIC ACID, VENOUS: 0.81 mmol/L (ref 0.5–2.2)

## 2013-12-27 LAB — PRO B NATRIURETIC PEPTIDE: Pro B Natriuretic peptide (BNP): 7477 pg/mL — ABNORMAL HIGH (ref 0–450)

## 2013-12-27 LAB — CBC
HEMATOCRIT: 30.3 % — AB (ref 36.0–46.0)
HEMOGLOBIN: 9.1 g/dL — AB (ref 12.0–15.0)
MCH: 33.6 pg (ref 26.0–34.0)
MCHC: 30 g/dL (ref 30.0–36.0)
MCV: 111.8 fL — ABNORMAL HIGH (ref 78.0–100.0)
Platelets: 172 10*3/uL (ref 150–400)
RBC: 2.71 MIL/uL — ABNORMAL LOW (ref 3.87–5.11)
RDW: 14.5 % (ref 11.5–15.5)
WBC: 7.3 10*3/uL (ref 4.0–10.5)

## 2013-12-27 LAB — LIPASE, BLOOD: Lipase: 18 U/L (ref 11–59)

## 2013-12-27 LAB — POC OCCULT BLOOD, ED: FECAL OCCULT BLD: NEGATIVE

## 2013-12-27 LAB — I-STAT TROPONIN, ED: Troponin i, poc: 0.02 ng/mL (ref 0.00–0.08)

## 2013-12-27 LAB — PROTIME-INR
INR: 1.13 (ref 0.00–1.49)
Prothrombin Time: 14.5 seconds (ref 11.6–15.2)

## 2013-12-27 MED ORDER — IOHEXOL 300 MG/ML  SOLN
100.0000 mL | Freq: Once | INTRAMUSCULAR | Status: AC | PRN
  Administered 2013-12-27: 100 mL via INTRAVENOUS

## 2013-12-27 MED ORDER — FUROSEMIDE 10 MG/ML IJ SOLN
80.0000 mg | Freq: Once | INTRAMUSCULAR | Status: AC
  Administered 2013-12-27: 80 mg via INTRAVENOUS
  Filled 2013-12-27: qty 8

## 2013-12-27 NOTE — ED Notes (Signed)
Bed: TL57 Expected date:  Expected time:  Means of arrival:  Comments: EMS diff breathing

## 2013-12-27 NOTE — H&P (Addendum)
PATIENT DETAILS Name: Maria Christian Age: 80 y.o. Sex: female Date of Birth: Jun 04, 1925 Admit Date: 12/27/2013 VEH:MCNO, TIFFANY, DO  CHIEF COMPLAINT:  Shortness of breath-1 week-but worsening for the past 3-4 days  HPI: Maria Christian is a 80 y.o. female with a Past Medical History of chronic diastolic heart failure, atrial fibrillation not on anticoagulation secondary to risk of falls, dementia, diabetes, Parkinson's disease who presents today with the above noted complaint. Please note, patient is a poor historian, most of this history is obtained from talking with the daughter at bedside. Apparently a week or so ago, patient recently had a death in the family- her son  passed away. Since then, patient has had progressively worsening shortness of breath, it was initially felt that this is related to anxiety. However her shortness of breath continued to get worsened, as a result patient was brought to the emergency room for further evaluation and treatment, she was found to have acute diastolic heart failure and I was asked to admit this patient for further evaluation and treatment. Per daughter at bedside, patient is essentially bedbound, she gets put on a wheelchair with the help of a Hoyer lift. She also has a history of dementia with Parkinson's disease. Per daughter, patient has chronic right leg swelling. Patient denies any chest pain, she does have a mild cough that is minimally productive of white phlegm. There is no history of nausea, vomiting or diarrhea. Patient's last bowel movement was at approximately 6 PM earlier this evening.    ALLERGIES:   Allergies  Allergen Reactions  . Aspirin Other (See Comments)    G.I. Upset only    PAST MEDICAL HISTORY: Past Medical History  Diagnosis Date  . Atrial fibrillation   . Altered mental status   . Encephalopathy   . Parkinson disease   . Hypertension   . GERD (gastroesophageal reflux disease)   . Hyperlipemia   .  Diabetes mellitus   . Coronary artery disease   . History of adenomatous polyp of colon 06/24/99  . Enlarged heart   . DEMENTIA   . Edema of lower extremity 07/13/11    right leg more swollen than left leg  . Esophageal dysmotility 07/02/12  . Horseshoe kidney   . Pancreatic lesion 05/22/11    no further workup  per PCP/family due to age  . Anemia   . Peripheral neuropathy   . Depression   . Thrombophlebitis     PAST SURGICAL HISTORY: Past Surgical History  Procedure Laterality Date  . Shoulder surgery  2001    left clavicle excision and acromioplasty  . Abdominal hysterectomy    . Cholecystectomy    . Total hip arthroplasty    . Breast surgery      2 benign tumors removed left breast  . Foot surgery      benign tumors from foot  . Esophageal dilation      several times by Dr. [name]  . Nose surgery    . Esophagogastroduodenoscopy (egd) with esophageal dilation N/A 08/01/2012    Procedure: ESOPHAGOGASTRODUODENOSCOPY (EGD) WITH ESOPHAGEAL DILATION;  Surgeon: [name], MD;  Location: [facility];  Service: Endoscopy;  Laterality: N/A;  with c-arm savory dilators  . Flexible sigmoidoscopy N/A 05/24/2013    Procedure: FLEXIBLE SIGMOIDOSCOPY;  Surgeon: [name], MD;  Location: [facility];  Service: Endoscopy;  Laterality: N/A;  . Flexible sigmoidoscopy N/A 05/26/2013    Procedure:  flex with decompression of  sigmoid volvulus;  Surgeon: [name], MD;  Location: [facility];  Service: Endoscopy;  Laterality: N/A;    MEDICATIONS AT HOME: Prior to Admission medications   Medication Sig Start Date End Date Taking? Authorizing Provider  acetaminophen (TYLENOL) 500 MG tablet Take 1,000 mg by mouth 3 (three) times daily.   Yes Historical Provider, MD  ALPRAZolam Duanne Moron) 0.25 MG tablet Take 0.25 mg by mouth daily as needed for anxiety.   Yes Historical Provider, MD  amLODipine (NORVASC) 5 MG tablet Take 5 mg by mouth daily.   Yes Historical Provider, MD  aspirin  81 MG chewable tablet Chew 81 mg by mouth daily.   Yes Historical Provider, MD  carvedilol (COREG) 3.125 MG tablet Take 3.125 mg by mouth daily.   Yes Historical Provider, MD  cholecalciferol (VITAMIN D) 1000 UNITS tablet Take 1,000 Units by mouth daily.   Yes Historical Provider, MD  clopidogrel (PLAVIX) 75 MG tablet Take 75 mg by mouth daily.    Yes Historical Provider, MD  Cranberry 475 MG CAPS Take 1 capsule by mouth 2 (two) times daily.   Yes Historical Provider, MD  cyclobenzaprine (FLEXERIL) 5 MG tablet Take 5 mg by mouth at bedtime.   Yes Historical Provider, MD  diclofenac sodium (VOLTAREN) 1 % GEL Apply 2 g topically 2 (two) times daily as needed (left neck pain).    Yes Historical Provider, MD  donepezil (ARICEPT) 10 MG tablet Take 10 mg by mouth at bedtime.   Yes Historical Provider, MD  ferrous sulfate 325 (65 FE) MG tablet Take 325 mg by mouth daily with breakfast.   Yes Historical Provider, MD  furosemide (LASIX) 40 MG tablet Take 40 mg by mouth daily.   Yes Historical Provider, MD  gabapentin (NEURONTIN) 300 MG capsule Take 300 mg by mouth 2 (two) times daily.   Yes Historical Provider, MD  hydrALAZINE (APRESOLINE) 10 MG tablet Take 10 mg by mouth 3 (three) times daily. HOLD if SBP <100/60 and pulse is less than 60   Yes Historical Provider, MD  lidocaine (LIDODERM) 5 % Place 1 patch onto the skin daily. Apply 1 patch to left lower back/hip every morning.  Remove & Discard patch within 12 hours or as directed by MD 07/28/13  Yes Thurnell Lose, MD  loratadine (CLARITIN) 10 MG tablet Take 10 mg by mouth daily.   Yes Historical Provider, MD  LORazepam (ATIVAN) 0.5 MG tablet Take 0.5 mg by mouth 2 (two) times daily.   Yes Historical Provider, MD  LORazepam (ATIVAN) 0.5 MG tablet Take 0.5 mg by mouth every 6 (six) hours as needed for anxiety.   Yes Historical Provider, MD  Multiple Vitamins-Minerals (CERTAGEN PO) Take 1 tablet by mouth daily.    Yes Historical Provider, MD  Polyethyl  Glycol-Propyl Glycol (SYSTANE) 0.4-0.3 % SOLN Place 1 drop into both eyes 4 (four) times daily as needed (dry eyes).   Yes Historical Provider, MD  potassium chloride SA (K-DUR,KLOR-CON) 20 MEQ tablet Take 20 mEq by mouth daily. 10/23/13  Yes Janece Canterbury, MD  pravastatin (PRAVACHOL) 20 MG tablet Take 20 mg by mouth at bedtime.    Yes Historical Provider, MD  sertraline (ZOLOFT) 50 MG tablet Take 50 mg by mouth daily.    Yes Historical Provider, MD  sodium bicarbonate 650 MG tablet Take 1,300 mg by mouth 3 (three) times daily.   Yes Historical Provider, MD  Travoprost, BAK Free, (TRAVATAN) 0.004 % SOLN ophthalmic solution Place 2 drops into both eyes  at bedtime.    Yes Historical Provider, MD  traZODone (DESYREL) 50 MG tablet Take 100 mg by mouth at bedtime.    Yes Historical Provider, MD  vitamin B-12 (CYANOCOBALAMIN) 500 MCG tablet Take 500 mcg by mouth daily.   Yes Historical Provider, MD  vitamin C (ASCORBIC ACID) 500 MG tablet Take 500 mg by mouth daily.   Yes Historical Provider, MD  dorzolamide (TRUSOPT) 2 % ophthalmic solution Place 1 drop into both eyes 3 (three) times daily.     Historical Provider, MD    FAMILY HISTORY: Family History  Problem Relation Age of Onset  . Cancer    . Heart disease      SOCIAL HISTORY:  reports that she quit smoking about 49 years ago. She has never used smokeless tobacco. She reports that she does not drink alcohol or use illicit drugs.  REVIEW OF SYSTEMS:  Constitutional:   No  weight loss, night sweats,  Fevers, chills, fatigue.  HEENT:    No headaches, Difficulty swallowing,Tooth/dental problems,Sore throat,  No sneezing, itching, ear ache, nasal congestion, post nasal drip,   Cardio-vascular: No chest pain, anasarca, dizziness, palpitations  GI:  No heartburn, indigestion, abdominal pain, nausea, vomiting, diarrhea, change in  bowel habits, loss of appetite  Resp:  No excess mucus,  No non-productive cough,  No coughing up of  blood.No change in color of mucus.No wheezing.No chest wall deformity  Skin:  no rash or lesions.  GU:  no dysuria, change in color of urine, no urgency or frequency.  No flank pain.  Musculoskeletal: No joint pain or swelling.  No decreased range of motion.  No back pain.  Psych: No change in mood or affect. No depression or anxiety.  No memory loss.   PHYSICAL EXAM: Blood pressure 152/66, pulse 78, temperature 97.8 F (36.6 C), temperature source Oral, resp. rate 19, SpO2 97.00%.  General appearance :Awake, mostly alert,in mild distress with tachypnea. Speech Clear. Not toxic Looking HEENT: Atraumatic and Normocephalic, pupils equally reactive to light and accomodation Neck: supple, Chest:Good air entry bilaterally, bibasilar rales upto mid lungs CVS: S1 S2 regular, no murmurs.  Abdomen: Bowel sounds present, Non tender and not distended with no gaurding, rigidity or rebound. Extremities: ++ Edema in right leg, trace edema in left leg. Neurology: Awake and mostly alert, Non focal Skin:No Rash Wounds:N/A  LABS ON ADMISSION:   Recent Labs  12/27/13 2145  NA 141  K 4.8  CL 103  CO2 27  GLUCOSE 163*  BUN 15  CREATININE 0.88  CALCIUM 9.7    Recent Labs  12/27/13 2145  AST 15  ALT 7  ALKPHOS 82  BILITOT 0.3  PROT 7.6  ALBUMIN 3.7    Recent Labs  12/27/13 2145  LIPASE 18    Recent Labs  12/27/13 2145  WBC 7.3  HGB 9.1*  HCT 30.3*  MCV 111.8*  PLT 172   No results found for this basename: CKTOTAL, CKMB, CKMBINDEX, TROPONINI,  in the last 72 hours No results found for this basename: DDIMER,  in the last 72 hours No components found with this basename: POCBNP,    RADIOLOGIC STUDIES ON ADMISSION: Dg Chest Port 1 View  12/27/2013   CLINICAL DATA:  Shortness of breath with cough and weakness.  EXAM: PORTABLE CHEST - 1 VIEW  COMPARISON:  10/13/2013.  FINDINGS: 2209 hrs. Vascular congestion with associated pulmonary edema. There is bilateral pleural  effusions, left greater than right. Cardiopericardial silhouette is markedly enlarged. Bones are  diffusely demineralized. Telemetry leads overlie the chest.  IMPRESSION: Cardiomegaly with pulmonary edema and bilateral pleural effusions.   Electronically Signed   By: Misty Stanley M.D.   On: 12/27/2013 22:57   Dg Abd Portable 1v  12/27/2013   CLINICAL DATA:  Abdominal discomfort, weakness and shortness of breath.  EXAM: PORTABLE ABDOMEN - 1 VIEW  COMPARISON:  Abdominal radiograph performed 10/14/2013  FINDINGS: There is slight interval improvement in colonic distention, with apparent stool noted throughout the colon. Scattered air-filled loops of small bowel are also seen. This may reflect residual ileus, improved from the prior study. No free intra-abdominal air is identified, though evaluation for free air is limited on a single supine view. Scattered clips are noted in the right upper quadrant. The stomach is partially filled with air.  Mild degenerative change is noted along the lumbar spine; the sacroiliac joints are unremarkable in appearance. The left hip arthroplasty is incompletely imaged but appears grossly unremarkable.  IMPRESSION: Slight interval improvement in colonic distention, with apparent stool noted throughout the colon. This may reflect residual ileus, improved from the prior study. No free intra-abdominal air seen.   Electronically Signed   By: Garald Balding M.D.   On: 12/27/2013 22:57     EKG: Independently reviewed. Afib  ASSESSMENT AND PLAN: Present on Admission:  . Acute on chronic respiratory failure with hypoxemia - Patient apparently has chronic respiratory failure and is on home O2. Her acute hypoxic respiratory failure secondary to acute diastolic heart failure. She will be placed on oxygen as needed to keep the O2 saturation above 90%, start on Lasix, follow clinical course.   . Acute diastolic CHF (congestive heart failure) - Chest x-ray does show bilateral pleural  effusion and pulmonary edema, BNP is significantly elevated. No fever or leukocytosis to suggest pneumonia. For now, place on IV Lasix, daily weights, strict I&O's. Follow clinical course.  Marland Kitchen Hypertension - Currently controlled, continue amlodipine, Coreg, and hydralazine.   . Dilated bowels seen on x-rays/CT of the abdomen - Has a history of sigmoid volvulus in the past. Abdomen is nondistended. Her last bowel movement was earlier this evening at 6 PM. Do not suspect at this time she has acute bowel obstruction or volvulus. Place on bowel regimen, replace potassium aggressively while on Lasix. Monitor closely.  . Atrial fibrillation with controlled ventricular response - Rate controlled with Coreg, suspect not a anticoagulation candidate given advanced age, fall risk   . Dementia with Parkinson's disease - At baseline, at risk for delirium while  inpatient.  - Continue Aricept, benzodiazepines, trazodone and Zoloft.   . Coronary artery disease - Stable without any chest pain. Shortness of breath is likely secondary to congestive heart failure.  - Continue antiplatelet agents, statins, beta blockers.   . Diabetes mellitus - Diet controlled as outpatient. Check A1c. Place on sensitive sliding scale. Follow CBGs.   Further plan will depend as patient's clinical course evolves and further radiologic and laboratory data become available. Patient will be monitored closely.  Above noted plan was discussed with patient/daughter at bedside/son-Mike [facility]) over the phone, they were in agreement.   DVT Prophylaxis: Prophylactic  Heparin  Code Status: DNR-reconfirmed with POA-Son-[phone]. Telephonic conversation discussing treatment plan and code status witnessed by ED RN.  Total time spent for admission equals 45 minutes.  [facility] Pager [phone]  If 7PM-7AM, please contact night-coverage [email or web address] Password [email or web address] 12/27/2013, 11:51  PM  **Disclaimer: This note may have been dictated with voice recognition software. Similar  sounding words can inadvertently be transcribed and this note may contain transcription errors which may not have been corrected upon publication of note.**

## 2013-12-27 NOTE — ED Notes (Signed)
Pt arrived from [facility] via EMS  And her complaints are sob,  Constipated and weakness,  Pt is full code.  On home O2,  ,  Uses accessory muscles to breath,  Distended abdomin

## 2013-12-27 NOTE — ED Provider Notes (Signed)
CSN: [id number]     Arrival date & time 12/27/13  2119 History   First MD Initiated Contact with Patient 12/27/13 2126     Chief Complaint  Patient presents with  . Shortness of Breath  . Weakness     (Consider location/radiation/quality/duration/timing/severity/associated sxs/prior Treatment) Patient is a 80 y.o. female presenting with shortness of breath and weakness. The history is provided by the patient.  Shortness of Breath Severity:  Moderate Onset quality:  Gradual Duration:  5 days Timing:  Constant Progression:  Worsening Chronicity:  New Context: not emotional upset and not URI   Relieved by:  Nothing Worsened by:  Nothing tried Associated symptoms: no abdominal pain, no chest pain, no cough, no fever and no vomiting   Weakness Associated symptoms include shortness of breath. Pertinent negatives include no chest pain and no abdominal pain.    Past Medical History  Diagnosis Date  . Atrial fibrillation   . Altered mental status   . Encephalopathy   . Parkinson disease   . Hypertension   . GERD (gastroesophageal reflux disease)   . Hyperlipemia   . Diabetes mellitus   . Coronary artery disease   . History of adenomatous polyp of colon 06/24/99  . Enlarged heart   . DEMENTIA   . Edema of lower extremity 07/13/11    right leg more swollen than left leg  . Esophageal dysmotility 07/02/12  . Horseshoe kidney   . Pancreatic lesion 05/22/11    no further workup  per PCP/family due to age  . Anemia   . Peripheral neuropathy   . Depression   . Thrombophlebitis    Past Surgical History  Procedure Laterality Date  . Shoulder surgery  2001    left clavicle excision and acromioplasty  . Abdominal hysterectomy    . Cholecystectomy    . Total hip arthroplasty    . Breast surgery      2 benign tumors removed left breast  . Foot surgery      benign tumors from foot  . Esophageal dilation      several times by Dr. [name]  . Nose surgery    .  Esophagogastroduodenoscopy (egd) with esophageal dilation N/A 08/01/2012    Procedure: ESOPHAGOGASTRODUODENOSCOPY (EGD) WITH ESOPHAGEAL DILATION;  Surgeon: [name], MD;  Location: [facility];  Service: Endoscopy;  Laterality: N/A;  with c-arm savory dilators  . Flexible sigmoidoscopy N/A 05/24/2013    Procedure: FLEXIBLE SIGMOIDOSCOPY;  Surgeon: [name], MD;  Location: [facility];  Service: Endoscopy;  Laterality: N/A;  . Flexible sigmoidoscopy N/A 05/26/2013    Procedure:  flex with decompression of sigmoid volvulus;  Surgeon: [name], MD;  Location: [facility];  Service: Endoscopy;  Laterality: N/A;   Family History  Problem Relation Age of Onset  . Cancer    . Heart disease     History  Substance Use Topics  . Smoking status: Former Smoker    Quit date: 10/09/1964  . Smokeless tobacco: Never Used  . Alcohol Use: No   OB History   Grav Para Term Preterm Abortions TAB SAB Ect Mult Living                 Review of Systems  Constitutional: Positive for fatigue. Negative for fever and chills.  Respiratory: Positive for shortness of breath. Negative for cough.   Cardiovascular: Negative for chest pain and leg swelling.  Gastrointestinal: Negative for vomiting and abdominal pain.  Neurological: Positive for weakness.  All other systems reviewed and are negative.     Allergies  Aspirin  Home Medications   Prior to Admission medications   Medication Sig Start Date End Date Taking? Authorizing Provider  acetaminophen (TYLENOL) 500 MG tablet Take 1,000 mg by mouth 3 (three) times daily.    Historical Provider, MD  amLODipine (NORVASC) 5 MG tablet Take 5 mg by mouth daily.    Historical Provider, MD  aspirin 81 MG chewable tablet Chew 81 mg by mouth daily.    Historical Provider, MD  carvedilol (COREG) 3.125 MG tablet Take 3.125 mg by mouth daily.    Historical Provider, MD  cholecalciferol (VITAMIN D) 1000 UNITS tablet Take 1,000 Units by mouth daily.     Historical Provider, MD  clopidogrel (PLAVIX) 75 MG tablet Take 75 mg by mouth daily.     Historical Provider, MD  Cranberry 475 MG CAPS Take 1 capsule by mouth 2 (two) times daily.    Historical Provider, MD  cyclobenzaprine (FLEXERIL) 5 MG tablet Take 5 mg by mouth at bedtime.    Historical Provider, MD  diclofenac sodium (VOLTAREN) 1 % GEL Apply 2 g topically 2 (two) times daily as needed (left neck pain).     Historical Provider, MD  dorzolamide (TRUSOPT) 2 % ophthalmic solution Place 1 drop into both eyes 3 (three) times daily.     Historical Provider, MD  ferrous sulfate 325 (65 FE) MG tablet Take 325 mg by mouth daily with breakfast.    Historical Provider, MD  gabapentin (NEURONTIN) 300 MG capsule Take 300 mg by mouth 2 (two) times daily.    Historical Provider, MD  hydrALAZINE (APRESOLINE) 10 MG tablet Take 10 mg by mouth 3 (three) times daily. HOLD if SBP <100/60 and pulse is less than 60    Historical Provider, MD  lidocaine (LIDODERM) 5 % Place 1 patch onto the skin daily. Apply 1 patch to left lower back/hip every morning.  Remove & Discard patch within 12 hours or as directed by MD 07/28/13   Thurnell Lose, MD  loratadine (CLARITIN) 10 MG tablet Take 10 mg by mouth daily.    Historical Provider, MD  Multiple Vitamins-Minerals (CERTAGEN PO) Take 1 tablet by mouth daily.     Historical Provider, MD  potassium chloride SA (K-DUR,KLOR-CON) 20 MEQ tablet Take 20 mEq by mouth daily. 10/23/13   Janece Canterbury, MD  pravastatin (PRAVACHOL) 20 MG tablet Take 20 mg by mouth at bedtime.     Historical Provider, MD  sertraline (ZOLOFT) 50 MG tablet Take 50 mg by mouth daily.     Historical Provider, MD  Travoprost, BAK Free, (TRAVATAN) 0.004 % SOLN ophthalmic solution Place 2 drops into both eyes at bedtime.     Historical Provider, MD  traZODone (DESYREL) 50 MG tablet Take 100 mg by mouth at bedtime.     Historical Provider, MD  vitamin B-12 (CYANOCOBALAMIN) 500 MCG tablet Take 500 mcg by mouth  daily.    Historical Provider, MD  vitamin C (ASCORBIC ACID) 500 MG tablet Take 500 mg by mouth daily.    Historical Provider, MD   BP 148/64  Pulse 85  Temp(Src) 97.8 F (36.6 C) (Oral)  Resp 20  SpO2 96% Physical Exam  Nursing note and vitals reviewed. Constitutional: She is oriented to person, place, and time. She appears well-developed and well-nourished. She appears distressed.  HENT:  Head: Normocephalic and atraumatic.  Mouth/Throat: Oropharynx is clear and moist.  Eyes: EOM are normal. Pupils are equal,  round, and reactive to light.  Neck: Normal range of motion. Neck supple.  Cardiovascular: Normal rate and regular rhythm.  Exam reveals no friction rub.   No murmur heard. Pulmonary/Chest: Breath sounds normal. She is in respiratory distress (talking in choppy sentences, dyspneic). She has no wheezes. She has no rales.  Abdominal: Soft. She exhibits no distension. There is no tenderness. There is no rebound.  Musculoskeletal: Normal range of motion. She exhibits no edema.  Neurological: She is alert and oriented to person, place, and time. No cranial nerve deficit. She exhibits normal muscle tone. Coordination normal.  Skin: No rash noted. She is not diaphoretic.    ED Course  Procedures (including critical care time) Labs Review Labs Reviewed  CBC  COMPREHENSIVE METABOLIC PANEL  PRO B NATRIURETIC PEPTIDE  LIPASE, BLOOD  PROTIME-INR  POC OCCULT BLOOD, ED  I-STAT CG4 LACTIC ACID, ED  I-STAT TROPOININ, ED  TYPE AND SCREEN    Imaging Review Ct Abdomen Pelvis W Contrast  12/27/2013   CLINICAL DATA:  Constipation in weakness, shortness of breath, abdominal distention  EXAM: CT ABDOMEN AND PELVIS WITH CONTRAST  TECHNIQUE: Multidetector CT imaging of the abdomen and pelvis was performed using the standard protocol following bolus administration of intravenous contrast.  CONTRAST:  15mL OMNIPAQUE IOHEXOL 300 MG/ML  SOLN  COMPARISON:  12/27/2013 plain films, 10/09/2013 CT  scan  FINDINGS: There are moderate to large bilateral pleural effusions with underlying compressive atelectasis. There is cardiomegaly.  There is fatty infiltration of the liver. Gallbladder is surgically absent. Spleen is normal. Adrenal glands are normal. There is a horseshoe kidney with out significant abnormalities.  Pancreas is normal. Abdominal aorta is significantly calcified. Stomach and small bowel appear normal. The large bowel is normal except for decompression of the distal most sigmoid and rectum likely due to peristalsis. There is beam attenuation artifact limiting evaluation of the pelvis related to the left hip replacement. There is mild fecal retention throughout the colon.  Allowing for this, bladder appears normal. There is a small volume of ascites in the pelvis. There are no acute musculoskeletal findings.  IMPRESSION: Similar to prior study, findings are most consistent with colonic ileus. There is mild fecal retention.  Moderate to large pleural effusions, larger on the right, with underlying compressive atelectasis.  Small volume ascites in the pelvis.   Electronically Signed   By: Skipper Cliche M.D.   On: 12/27/2013 23:49   Dg Chest Port 1 View  12/27/2013   CLINICAL DATA:  Shortness of breath with cough and weakness.  EXAM: PORTABLE CHEST - 1 VIEW  COMPARISON:  10/13/2013.  FINDINGS: 2209 hrs. Vascular congestion with associated pulmonary edema. There is bilateral pleural effusions, left greater than right. Cardiopericardial silhouette is markedly enlarged. Bones are diffusely demineralized. Telemetry leads overlie the chest.  IMPRESSION: Cardiomegaly with pulmonary edema and bilateral pleural effusions.   Electronically Signed   By: Misty Stanley M.D.   On: 12/27/2013 22:57   Dg Abd Portable 1v  12/27/2013   CLINICAL DATA:  Abdominal discomfort, weakness and shortness of breath.  EXAM: PORTABLE ABDOMEN - 1 VIEW  COMPARISON:  Abdominal radiograph performed 10/14/2013  FINDINGS:  There is slight interval improvement in colonic distention, with apparent stool noted throughout the colon. Scattered air-filled loops of small bowel are also seen. This may reflect residual ileus, improved from the prior study. No free intra-abdominal air is identified, though evaluation for free air is limited on a single supine view. Scattered clips are noted  in the right upper quadrant. The stomach is partially filled with air.  Mild degenerative change is noted along the lumbar spine; the sacroiliac joints are unremarkable in appearance. The left hip arthroplasty is incompletely imaged but appears grossly unremarkable.  IMPRESSION: Slight interval improvement in colonic distention, with apparent stool noted throughout the colon. This may reflect residual ileus, improved from the prior study. No free intra-abdominal air seen.   Electronically Signed   By: Garald Balding M.D.   On: 12/27/2013 22:57     EKG Interpretation   Date/Time:  Saturday December 27 2013 21:28:20 EDT Ventricular Rate:  91 PR Interval:    QRS Duration: 86 QT Interval:  392 QTC Calculation: 482 R Axis:   98 Text Interpretation:  Atrial fibrillation Right axis deviation Low  voltage, extremity leads Borderline repolarization abnormality Similar to  prior Confirmed by Mingo Amber  MD, Golden Emile ([id number]) on 12/27/2013 9:43:37 PM      MDM   Final diagnoses:  Acute diastolic CHF (congestive heart failure)  Acute on chronic respiratory failure with hypoxemia  Atrial fibrillation with controlled ventricular response  Dementia, without behavioral disturbance    36F with hx of Afib, HTN, CAD presents with SOB. Denies CP, fever, vomiting. No cough. Some black stools noted by patient and family. No falls. Hypoxic here, corrected on 3L, on 3L [place] at baseline. Lungs clear, belly with mild distention, no peritoneal signs. Not impacted. Hemoccult negative.  EKG similar to prior with Afib, normal T wave morphology. Will get CXR, labs, EKG. Will  get abdominal xray to look for signs of obstruction. No abdominal tenderness here.  CXR shows bilateral pleural effusions. BNP elevated. IV lasix given. Admitted.   Osvaldo Shipper, MD 12/28/13 [phone]

## 2013-12-28 ENCOUNTER — Encounter (HOSPITAL_COMMUNITY): Payer: Self-pay | Admitting: *Deleted

## 2013-12-28 DIAGNOSIS — R0602 Shortness of breath: Secondary | ICD-10-CM

## 2013-12-28 LAB — CBC
HCT: 28.5 % — ABNORMAL LOW (ref 36.0–46.0)
Hemoglobin: 8.6 g/dL — ABNORMAL LOW (ref 12.0–15.0)
MCH: 34.1 pg — ABNORMAL HIGH (ref 26.0–34.0)
MCHC: 30.2 g/dL (ref 30.0–36.0)
MCV: 113.1 fL — AB (ref 78.0–100.0)
PLATELETS: 137 10*3/uL — AB (ref 150–400)
RBC: 2.52 MIL/uL — ABNORMAL LOW (ref 3.87–5.11)
RDW: 14.5 % (ref 11.5–15.5)
WBC: 7 10*3/uL (ref 4.0–10.5)

## 2013-12-28 LAB — TROPONIN I: Troponin I: <0.3 ng/mL (ref <0.30)

## 2013-12-28 LAB — GLUCOSE, CAPILLARY
GLUCOSE-CAPILLARY: 148 mg/dL — AB (ref 70–99)
GLUCOSE-CAPILLARY: 72 mg/dL (ref 70–99)
Glucose-Capillary: 158 mg/dL — ABNORMAL HIGH (ref 70–99)
Glucose-Capillary: 154 mg/dL — ABNORMAL HIGH (ref 70–99)
Glucose-Capillary: 92 mg/dL (ref 70–99)

## 2013-12-28 LAB — BASIC METABOLIC PANEL
Anion gap: 9 (ref 5–15)
BUN: 15 mg/dL (ref 6–23)
CALCIUM: 9.6 mg/dL (ref 8.4–10.5)
CO2: 30 mEq/L (ref 19–32)
Chloride: 102 mEq/L (ref 96–112)
Creatinine, Ser: 0.9 mg/dL (ref 0.50–1.10)
GFR, EST AFRICAN AMERICAN: 65 mL/min — AB (ref >90)
GFR, EST NON AFRICAN AMERICAN: 56 mL/min — AB (ref >90)
GLUCOSE: 183 mg/dL — AB (ref 70–99)
Potassium: 4.7 mEq/L (ref 3.7–5.3)
SODIUM: 141 meq/L (ref 137–147)

## 2013-12-28 LAB — HEMOGLOBIN A1C
Hgb A1c MFr Bld: 5.8 % — ABNORMAL HIGH (ref <5.7)
Mean Plasma Glucose: 120 mg/dL — ABNORMAL HIGH (ref <117)

## 2013-12-28 LAB — MRSA PCR SCREENING: MRSA by PCR: NEGATIVE

## 2013-12-28 MED ORDER — ONDANSETRON HCL 4 MG PO TABS: 4.0000 mg | ORAL_TABLET | Freq: Four times a day (QID) | ORAL | Status: DC | PRN

## 2013-12-28 MED ORDER — ONDANSETRON HCL 4 MG/2ML IJ SOLN: 4.0000 mg | Freq: Four times a day (QID) | INTRAMUSCULAR | Status: DC | PRN

## 2013-12-28 MED ORDER — CYANOCOBALAMIN 500 MCG PO TABS
500.0000 ug | ORAL_TABLET | Freq: Every day | ORAL | Status: DC
  Administered 2013-12-28 – 2014-01-05 (×9): 500 ug via ORAL
  Filled 2013-12-28 (×9): qty 1

## 2013-12-28 MED ORDER — HYDRALAZINE HCL 10 MG PO TABS
10.0000 mg | ORAL_TABLET | Freq: Three times a day (TID) | ORAL | Status: DC
  Administered 2013-12-28 – 2014-01-05 (×24): 10 mg via ORAL
  Filled 2013-12-28 (×27): qty 1

## 2013-12-28 MED ORDER — FERROUS SULFATE 325 (65 FE) MG PO TABS
325.0000 mg | ORAL_TABLET | Freq: Every day | ORAL | Status: DC
  Administered 2013-12-29 – 2014-01-05 (×8): 325 mg via ORAL
  Filled 2013-12-28 (×10): qty 1

## 2013-12-28 MED ORDER — POTASSIUM CHLORIDE CRYS ER 20 MEQ PO TBCR
40.0000 meq | EXTENDED_RELEASE_TABLET | Freq: Every day | ORAL | Status: DC
  Administered 2013-12-28 – 2013-12-31 (×4): 40 meq via ORAL
  Filled 2013-12-28 (×4): qty 2

## 2013-12-28 MED ORDER — CRANBERRY 475 MG PO CAPS: 1.0000 | ORAL_CAPSULE | Freq: Two times a day (BID) | ORAL | Status: DC

## 2013-12-28 MED ORDER — DORZOLAMIDE HCL 2 % OP SOLN
1.0000 [drp] | Freq: Three times a day (TID) | OPHTHALMIC | Status: DC
  Administered 2013-12-28 – 2014-01-05 (×26): 1 [drp] via OPHTHALMIC
  Filled 2013-12-28: qty 10

## 2013-12-28 MED ORDER — SODIUM CHLORIDE 0.9 % IV SOLN: 250.0000 mL | INTRAVENOUS | Status: DC | PRN

## 2013-12-28 MED ORDER — VITAMIN C 500 MG PO TABS
500.0000 mg | ORAL_TABLET | Freq: Every day | ORAL | Status: DC
  Administered 2013-12-28 – 2014-01-05 (×9): 500 mg via ORAL
  Filled 2013-12-28 (×9): qty 1

## 2013-12-28 MED ORDER — LORAZEPAM 0.5 MG PO TABS
0.5000 mg | ORAL_TABLET | Freq: Two times a day (BID) | ORAL | Status: DC
  Administered 2013-12-28 – 2014-01-05 (×16): 0.5 mg via ORAL
  Filled 2013-12-28 (×17): qty 1

## 2013-12-28 MED ORDER — LORATADINE 10 MG PO TABS
10.0000 mg | ORAL_TABLET | Freq: Every day | ORAL | Status: DC
  Administered 2013-12-28 – 2014-01-05 (×9): 10 mg via ORAL
  Filled 2013-12-28 (×9): qty 1

## 2013-12-28 MED ORDER — ALPRAZOLAM 0.25 MG PO TABS
0.2500 mg | ORAL_TABLET | Freq: Two times a day (BID) | ORAL | Status: DC | PRN
  Administered 2013-12-28 – 2014-01-03 (×5): 0.25 mg via ORAL
  Filled 2013-12-28 (×6): qty 1

## 2013-12-28 MED ORDER — SODIUM BICARBONATE 650 MG PO TABS
1300.0000 mg | ORAL_TABLET | Freq: Three times a day (TID) | ORAL | Status: DC
  Administered 2013-12-28 – 2013-12-30 (×6): 1300 mg via ORAL
  Filled 2013-12-28 (×9): qty 2

## 2013-12-28 MED ORDER — SODIUM CHLORIDE 0.9 % IJ SOLN
3.0000 mL | INTRAMUSCULAR | Status: DC | PRN
Start: 2013-12-28 — End: 2014-01-05

## 2013-12-28 MED ORDER — ASPIRIN 81 MG PO CHEW
81.0000 mg | CHEWABLE_TABLET | Freq: Every day | ORAL | Status: DC
  Administered 2013-12-28 – 2014-01-05 (×9): 81 mg via ORAL
  Filled 2013-12-28 (×9): qty 1

## 2013-12-28 MED ORDER — POLYETHYLENE GLYCOL 3350 17 G PO PACK
17.0000 g | PACK | Freq: Every day | ORAL | Status: DC
  Administered 2013-12-28 – 2013-12-29 (×2): 17 g via ORAL
  Filled 2013-12-28 (×3): qty 1

## 2013-12-28 MED ORDER — CLOPIDOGREL BISULFATE 75 MG PO TABS
75.0000 mg | ORAL_TABLET | Freq: Every day | ORAL | Status: DC
  Administered 2013-12-28 – 2014-01-05 (×9): 75 mg via ORAL
  Filled 2013-12-28 (×9): qty 1

## 2013-12-28 MED ORDER — VITAMIN D3 25 MCG (1000 UNIT) PO TABS
1000.0000 [IU] | ORAL_TABLET | Freq: Every day | ORAL | Status: DC
  Administered 2013-12-28 – 2014-01-05 (×9): 1000 [IU] via ORAL
  Filled 2013-12-28 (×9): qty 1

## 2013-12-28 MED ORDER — INSULIN ASPART 100 UNIT/ML ~~LOC~~ SOLN
0.0000 [IU] | Freq: Three times a day (TID) | SUBCUTANEOUS | Status: DC
  Administered 2013-12-28: 2 [IU] via SUBCUTANEOUS
  Administered 2013-12-28: 1 [IU] via SUBCUTANEOUS
  Administered 2013-12-29: 7 [IU] via SUBCUTANEOUS
  Administered 2013-12-29 (×2): 1 [IU] via SUBCUTANEOUS
  Administered 2013-12-30: 2 [IU] via SUBCUTANEOUS
  Administered 2013-12-30: 1 [IU] via SUBCUTANEOUS
  Administered 2013-12-31 (×2): 2 [IU] via SUBCUTANEOUS
  Administered 2014-01-01 – 2014-01-02 (×4): 1 [IU] via SUBCUTANEOUS
  Administered 2014-01-02: 2 [IU] via SUBCUTANEOUS
  Administered 2014-01-03 (×2): 1 [IU] via SUBCUTANEOUS
  Administered 2014-01-03: 2 [IU] via SUBCUTANEOUS
  Administered 2014-01-04 – 2014-01-05 (×4): 1 [IU] via SUBCUTANEOUS

## 2013-12-28 MED ORDER — SIMVASTATIN 10 MG PO TABS
10.0000 mg | ORAL_TABLET | Freq: Every day | ORAL | Status: DC
  Administered 2013-12-29 – 2014-01-04 (×7): 10 mg via ORAL
  Filled 2013-12-28 (×9): qty 1

## 2013-12-28 MED ORDER — FLEET ENEMA 7-19 GM/118ML RE ENEM: 1.0000 | ENEMA | Freq: Once | RECTAL | Status: AC | PRN

## 2013-12-28 MED ORDER — BIOTENE DRY MOUTH MT LIQD
15.0000 mL | Freq: Two times a day (BID) | OROMUCOSAL | Status: DC
  Administered 2013-12-28 – 2014-01-05 (×14): 15 mL via OROMUCOSAL

## 2013-12-28 MED ORDER — ACETAMINOPHEN 500 MG PO TABS
1000.0000 mg | ORAL_TABLET | Freq: Three times a day (TID) | ORAL | Status: DC
  Administered 2013-12-28 – 2014-01-05 (×25): 1000 mg via ORAL
  Filled 2013-12-28 (×30): qty 2

## 2013-12-28 MED ORDER — CYCLOBENZAPRINE HCL 5 MG PO TABS
5.0000 mg | ORAL_TABLET | Freq: Every day | ORAL | Status: DC
  Administered 2013-12-28 – 2014-01-04 (×9): 5 mg via ORAL
  Filled 2013-12-28 (×10): qty 1

## 2013-12-28 MED ORDER — AMLODIPINE BESYLATE 5 MG PO TABS
5.0000 mg | ORAL_TABLET | Freq: Every day | ORAL | Status: DC
  Filled 2013-12-28: qty 1

## 2013-12-28 MED ORDER — BISACODYL 10 MG RE SUPP: 10.0000 mg | Freq: Every day | RECTAL | Status: DC | PRN

## 2013-12-28 MED ORDER — TRAZODONE HCL 100 MG PO TABS
100.0000 mg | ORAL_TABLET | Freq: Every day | ORAL | Status: DC
  Administered 2013-12-28 – 2014-01-04 (×9): 100 mg via ORAL
  Filled 2013-12-28 (×2): qty 1
  Filled 2013-12-28: qty 2
  Filled 2013-12-28 (×5): qty 1
  Filled 2013-12-28: qty 2
  Filled 2013-12-28 (×3): qty 1

## 2013-12-28 MED ORDER — ALBUTEROL SULFATE (2.5 MG/3ML) 0.083% IN NEBU
2.5000 mg | INHALATION_SOLUTION | RESPIRATORY_TRACT | Status: DC | PRN
  Administered 2013-12-29: 2.5 mg via RESPIRATORY_TRACT
  Filled 2013-12-28: qty 3

## 2013-12-28 MED ORDER — LATANOPROST 0.005 % OP SOLN
2.0000 [drp] | Freq: Every day | OPHTHALMIC | Status: DC
  Administered 2013-12-28 – 2014-01-04 (×8): 2 [drp] via OPHTHALMIC
  Filled 2013-12-28 (×2): qty 2.5

## 2013-12-28 MED ORDER — SENNA 8.6 MG PO TABS
1.0000 | ORAL_TABLET | Freq: Two times a day (BID) | ORAL | Status: DC
  Administered 2013-12-28 – 2014-01-04 (×14): 8.6 mg via ORAL
  Filled 2013-12-28 (×12): qty 1

## 2013-12-28 MED ORDER — SERTRALINE HCL 50 MG PO TABS
50.0000 mg | ORAL_TABLET | Freq: Every day | ORAL | Status: DC
  Administered 2013-12-28 – 2014-01-05 (×9): 50 mg via ORAL
  Filled 2013-12-28 (×9): qty 1

## 2013-12-28 MED ORDER — GABAPENTIN 300 MG PO CAPS
300.0000 mg | ORAL_CAPSULE | Freq: Two times a day (BID) | ORAL | Status: DC
  Administered 2013-12-28 – 2014-01-05 (×18): 300 mg via ORAL
  Filled 2013-12-28 (×19): qty 1

## 2013-12-28 MED ORDER — FUROSEMIDE 10 MG/ML IJ SOLN
40.0000 mg | Freq: Two times a day (BID) | INTRAMUSCULAR | Status: DC
  Administered 2013-12-28 – 2013-12-29 (×3): 40 mg via INTRAVENOUS
  Filled 2013-12-28 (×6): qty 4

## 2013-12-28 MED ORDER — CARVEDILOL 3.125 MG PO TABS
3.1250 mg | ORAL_TABLET | Freq: Every day | ORAL | Status: DC
  Administered 2013-12-28 – 2014-01-05 (×9): 3.125 mg via ORAL
  Filled 2013-12-28 (×9): qty 1

## 2013-12-28 MED ORDER — HEPARIN SODIUM (PORCINE) 5000 UNIT/ML IJ SOLN
5000.0000 [IU] | Freq: Three times a day (TID) | INTRAMUSCULAR | Status: DC
Start: 2013-12-28 — End: 2014-01-05
  Administered 2013-12-28 – 2014-01-05 (×24): 5000 [IU] via SUBCUTANEOUS
  Filled 2013-12-28 (×30): qty 1

## 2013-12-28 MED ORDER — POLYVINYL ALCOHOL 1.4 % OP SOLN
1.0000 [drp] | Freq: Four times a day (QID) | OPHTHALMIC | Status: DC | PRN
  Filled 2013-12-28: qty 15

## 2013-12-28 MED ORDER — SODIUM CHLORIDE 0.9 % IJ SOLN
3.0000 mL | Freq: Two times a day (BID) | INTRAMUSCULAR | Status: DC
  Administered 2013-12-28 – 2014-01-04 (×8): 3 mL via INTRAVENOUS

## 2013-12-28 MED ORDER — DONEPEZIL HCL 10 MG PO TABS
10.0000 mg | ORAL_TABLET | Freq: Every day | ORAL | Status: DC
  Administered 2013-12-28 – 2014-01-05 (×8): 10 mg via ORAL
  Filled 2013-12-28 (×10): qty 1

## 2013-12-28 MED ORDER — GUAIFENESIN-DM 100-10 MG/5ML PO SYRP
5.0000 mL | ORAL_SOLUTION | ORAL | Status: DC | PRN
Start: 2013-12-28 — End: 2014-01-05

## 2013-12-28 MED ORDER — SODIUM CHLORIDE 0.9 % IJ SOLN
3.0000 mL | Freq: Two times a day (BID) | INTRAMUSCULAR | Status: DC
  Administered 2013-12-28 – 2013-12-30 (×4): 3 mL via INTRAVENOUS

## 2013-12-28 NOTE — Progress Notes (Signed)
Tried a mask for mouth breathing but pt could not tolerate it, made her more anxious and she kept pulling it off.  Sats 95% on 4L

## 2013-12-28 NOTE — Progress Notes (Signed)
[facility] PROGRESS NOTE  MEMORI SAMMON BSJ:[id number] DOB: 1924-06-11 DOA: 12/27/2013 PCP: Hollace Kinnier, DO  Assessment/Plan: . Acute on chronic respiratory failure with hypoxemia  - Patient apparently has chronic respiratory failure and is on home O2. Her acute hypoxic respiratory failure secondary to acute diastolic heart failure.  - continue with IV lasix.   . Acute diastolic CHF (congestive heart failure)  - Chest x-ray does show bilateral pleural effusion and pulmonary edema, BNP is significantly elevated. No fever or leukocytosis to suggest pneumonia. -Continue with IV lasix 40 Mg BID.  -urine out put 1.5 L. Weight 71.8 ----70.5 -repeat Chest x ray in 48 hours,.   . Hypertension  - Currently controlled,  Coreg, and hydralazine. Holder parameter for hydralazine.  -Will hold Norvasc to avoid hypotension, now patient on IV lasix.   . Dilated bowels seen on x-rays/CT of the abdomen  - Has a history of sigmoid volvulus in the past. Abdomen is nondistended. Had multiple BM overnight.  while on Lasix. Monitor closely. Has prior history of C diff also. If diarrhea will check for C diff.   . Atrial fibrillation with controlled ventricular response  - Rate controlled with Coreg, suspect not a anticoagulation candidate given advanced age, fall risk   . Dementia with Parkinson's disease  - At baseline, at risk for delirium while inpatient.  - Continue Aricept, benzodiazepines, trazodone and Zoloft.  -carefull with oversedation.   . Coronary artery disease  - Stable without any chest pain. Troponin negative.  - Continue antiplatelet agents, statins, beta blockers.   . Diabetes mellitus  - Diet controlled as outpatient.  A1c pending. SSI.    Code Status: DNR Family Communication: none at bedside.  Disposition Plan: To be determine.    Consultants:  none  Procedures:  Doppler LE:   Antibiotics:  none  HPI/Subjective: Patient received xanax early this morning  for anxiety. She wake up, follow command, answer question.  She relates feeling better, dyspnea improved. Denies abdominal pain.   Objective: Filed Vitals:   12/28/13 0552  BP: 138/71  Pulse: 75  Temp: 97.7 F (36.5 C)  Resp: 22    Intake/Output Summary (Last 24 hours) at 12/28/13 0820 Last data filed at 12/28/13 0604  Gross per 24 hour  Intake    240 ml  Output   1550 ml  Net  -1310 ml   Filed Weights   12/28/13 0045 12/28/13 0552  Weight: 71.8 kg (158 lb 4.6 oz) 70.5 kg (155 lb 6.8 oz)    Exam:   General:  Sleepy, open eyes to voice, answer some questions.   Cardiovascular: S 1, S 2 RRR  Respiratory: bilateral crackles unlabored.   Abdomen: BS present, soft, mild distended, no tenderness.   Musculoskeletal: right LE edema.   Data Reviewed: Basic Metabolic Panel:  Recent Labs Lab 12/27/13 2145 12/28/13 0414  NA 141 141  K 4.8 4.7  CL 103 102  CO2 27 30  GLUCOSE 163* 183*  BUN 15 15  CREATININE 0.88 0.90  CALCIUM 9.7 9.6   Liver Function Tests:  Recent Labs Lab 12/27/13 2145  AST 15  ALT 7  ALKPHOS 82  BILITOT 0.3  PROT 7.6  ALBUMIN 3.7    Recent Labs Lab 12/27/13 2145  LIPASE 18   No results found for this basename: AMMONIA,  in the last 168 hours CBC:  Recent Labs Lab 12/27/13 2145 12/28/13 0414  WBC 7.3 7.0  HGB 9.1* 8.6*  HCT 30.3* 28.5*  MCV  111.8* 113.1*  PLT 172 137*   Cardiac Enzymes:  Recent Labs Lab 12/28/13 0412 12/28/13 0659  TROPONINI <0.30 <0.30   BNP (last 3 results)  Recent Labs  05/24/13 0250 12/27/13 2146  PROBNP 3915.0* 7477.0*   CBG:  Recent Labs Lab 12/28/13 0102  GLUCAP 158*    Recent Results (from the past 240 hour(s))  MRSA PCR SCREENING     Status: None   Collection Time    12/28/13  1:30 AM      Result Value Ref Range Status   MRSA by PCR NEGATIVE  NEGATIVE Final   Comment:            The GeneXpert MRSA Assay (FDA     approved for NASAL specimens     only), is one component  of a     comprehensive MRSA colonization     surveillance program. It is not     intended to diagnose MRSA     infection nor to guide or     monitor treatment for     MRSA infections.     Studies: Ct Abdomen Pelvis W Contrast  12/27/2013   CLINICAL DATA:  Constipation in weakness, shortness of breath, abdominal distention  EXAM: CT ABDOMEN AND PELVIS WITH CONTRAST  TECHNIQUE: Multidetector CT imaging of the abdomen and pelvis was performed using the standard protocol following bolus administration of intravenous contrast.  CONTRAST:  136mL OMNIPAQUE IOHEXOL 300 MG/ML  SOLN  COMPARISON:  12/27/2013 plain films, 10/09/2013 CT scan  FINDINGS: There are moderate to large bilateral pleural effusions with underlying compressive atelectasis. There is cardiomegaly.  There is fatty infiltration of the liver. Gallbladder is surgically absent. Spleen is normal. Adrenal glands are normal. There is a horseshoe kidney with out significant abnormalities.  Pancreas is normal. Abdominal aorta is significantly calcified. Stomach and small bowel appear normal. The large bowel is normal except for decompression of the distal most sigmoid and rectum likely due to peristalsis. There is beam attenuation artifact limiting evaluation of the pelvis related to the left hip replacement. There is mild fecal retention throughout the colon.  Allowing for this, bladder appears normal. There is a small volume of ascites in the pelvis. There are no acute musculoskeletal findings.  IMPRESSION: Similar to prior study, findings are most consistent with colonic ileus. There is mild fecal retention.  Moderate to large pleural effusions, larger on the right, with underlying compressive atelectasis.  Small volume ascites in the pelvis.   Electronically Signed   By: Skipper Cliche M.D.   On: 12/27/2013 23:49   Dg Chest Port 1 View  12/27/2013   CLINICAL DATA:  Shortness of breath with cough and weakness.  EXAM: PORTABLE CHEST - 1 VIEW   COMPARISON:  10/13/2013.  FINDINGS: 2209 hrs. Vascular congestion with associated pulmonary edema. There is bilateral pleural effusions, left greater than right. Cardiopericardial silhouette is markedly enlarged. Bones are diffusely demineralized. Telemetry leads overlie the chest.  IMPRESSION: Cardiomegaly with pulmonary edema and bilateral pleural effusions.   Electronically Signed   By: Misty Stanley M.D.   On: 12/27/2013 22:57   Dg Abd Portable 1v  12/27/2013   CLINICAL DATA:  Abdominal discomfort, weakness and shortness of breath.  EXAM: PORTABLE ABDOMEN - 1 VIEW  COMPARISON:  Abdominal radiograph performed 10/14/2013  FINDINGS: There is slight interval improvement in colonic distention, with apparent stool noted throughout the colon. Scattered air-filled loops of small bowel are also seen. This may reflect residual ileus, improved from  the prior study. No free intra-abdominal air is identified, though evaluation for free air is limited on a single supine view. Scattered clips are noted in the right upper quadrant. The stomach is partially filled with air.  Mild degenerative change is noted along the lumbar spine; the sacroiliac joints are unremarkable in appearance. The left hip arthroplasty is incompletely imaged but appears grossly unremarkable.  IMPRESSION: Slight interval improvement in colonic distention, with apparent stool noted throughout the colon. This may reflect residual ileus, improved from the prior study. No free intra-abdominal air seen.   Electronically Signed   By: Garald Balding M.D.   On: 12/27/2013 22:57    Scheduled Meds: . acetaminophen  1,000 mg Oral TID  . amLODipine  5 mg Oral Daily  . aspirin  81 mg Oral Daily  . carvedilol  3.125 mg Oral Daily  . cholecalciferol  1,000 Units Oral Daily  . clopidogrel  75 mg Oral Daily  . cyanocobalamin  500 mcg Oral Daily  . cyclobenzaprine  5 mg Oral QHS  . donepezil  10 mg Oral QHS  . dorzolamide  1 drop Both Eyes TID  . ferrous  sulfate  325 mg Oral Q breakfast  . furosemide  40 mg Intravenous BID  . gabapentin  300 mg Oral BID  . heparin  5,000 Units Subcutaneous 3 times per day  . hydrALAZINE  10 mg Oral TID  . insulin aspart  0-9 Units Subcutaneous TID WC  . latanoprost  2 drop Both Eyes QHS  . loratadine  10 mg Oral Daily  . LORazepam  0.5 mg Oral BID  . polyethylene glycol  17 g Oral Daily  . potassium chloride SA  40 mEq Oral Daily  . senna  1 tablet Oral BID  . sertraline  50 mg Oral Daily  . simvastatin  10 mg Oral q1800  . sodium bicarbonate  1,300 mg Oral TID  . sodium chloride  3 mL Intravenous Q12H  . sodium chloride  3 mL Intravenous Q12H  . traZODone  100 mg Oral QHS  . vitamin C  500 mg Oral Daily   Continuous Infusions:   Active Problems:   Atrial fibrillation with controlled ventricular response   Hypertension   Diabetes mellitus   Coronary artery disease   Dementia   Acute diastolic CHF (congestive heart failure)   Acute on chronic respiratory failure with hypoxemia    Time spent: 35 minutes.     Niel Hummer A  [facility] Pager [phone]. If 7PM-7AM, please contact night-coverage at [email or web address], password [facility] 12/28/2013, 8:20 AM  LOS: 1 day

## 2013-12-28 NOTE — Evaluation (Signed)
Clinical/Bedside Swallow Evaluation Patient Details  Name: Maria Christian DOBOSZ MRN: [id number] Date of Birth: [birth year]  Today's Date: 12/28/2013 Time: 1324-4010 SLP Time Calculation (min): 20 min  Past Medical History:  Past Medical History  Diagnosis Date  . Atrial fibrillation   . Altered mental status   . Encephalopathy   . Parkinson disease   . Hypertension   . GERD (gastroesophageal reflux disease)   . Hyperlipemia   . Diabetes mellitus   . Coronary artery disease   . History of adenomatous polyp of colon 06/24/99  . Enlarged heart   . DEMENTIA   . Edema of lower extremity 07/13/11    right leg more swollen than left leg  . Esophageal dysmotility 07/02/12  . Horseshoe kidney   . Pancreatic lesion 05/22/11    no further workup  per PCP/family due to age  . Anemia   . Peripheral neuropathy   . Depression   . Thrombophlebitis    Past Surgical History:  Past Surgical History  Procedure Laterality Date  . Shoulder surgery  2001    left clavicle excision and acromioplasty  . Abdominal hysterectomy    . Cholecystectomy    . Total hip arthroplasty    . Breast surgery      2 benign tumors removed left breast  . Foot surgery      benign tumors from foot  . Esophageal dilation      several times by Dr. [name]  . Nose surgery    . Esophagogastroduodenoscopy (egd) with esophageal dilation N/A 08/01/2012    Procedure: ESOPHAGOGASTRODUODENOSCOPY (EGD) WITH ESOPHAGEAL DILATION;  Surgeon: [name], MD;  Location: [facility];  Service: Endoscopy;  Laterality: N/A;  with c-arm savory dilators  . Flexible sigmoidoscopy N/A 05/24/2013    Procedure: FLEXIBLE SIGMOIDOSCOPY;  Surgeon: [name], MD;  Location: [facility];  Service: Endoscopy;  Laterality: N/A;  . Flexible sigmoidoscopy N/A 05/26/2013    Procedure:  flex with decompression of sigmoid volvulus;  Surgeon: [name], MD;  Location: [facility];  Service: Endoscopy;  Laterality: N/A;   HPI:  80 yo  female adm to [facility] with cough and AMS.  Diagnosed with CHF - Pt PMH + for Parkinson's disease, oropharyngeal and esophageal dysphagia (presbyesophagus and stricture) requiring dilatation 07/2012.  Swallow evaluation ordered.  CXR showed pulmonary edema, bilateral effusions.     Assessment / Plan / Recommendation Clinical Impression  Pt currently with complaint of needing "oxygen" and noted to have accessory muscle use with breathing - open mouth posture.  Pt is severely dysarthric, grossly weak and has weak nonprotective cued cough.  Observed her being given medicine crushed with applesauce and water from RN. Highly suspect pt aspirated water via straw- c/b subtle throat clear/cough immediately post-swallow.     Due to pt's current lethargy, work of breathing and premorbid complex medical issues including parkinson's dysphagia, recommend NPO except needed medications with applesauce crushed.  Provided family with toothettes to provide pt with oral moisture for comfort.    Educated pt's daughter, granddaughter and RN present to recommendations and all agree to plan.  Will follow up next date for improved respiratory/mental/medical status.      Aspiration Risk    SEVERE   Diet Recommendation NPO;NPO except meds   Medication Administration: Crushed with puree    Other  Recommendations Oral Care Recommendations: Oral care Q4 per protocol   Follow Up Recommendations  Other (comment) (tbd)    Frequency and Duration min 2x/week  2 weeks   Pertinent Vitals/Pain Afebrile, decreased     Swallow Study Prior Functional Status   see [place] Date of Onset: 12/28/13 HPI: 80 yo female adm to [facility] with cough and AMS.  Diagnosed with CHF - Pt PMH + for Parkinson's disease, oropharyngeal and esophageal dysphagia (presbyesophagus and stricture) requiring dilatation 07/2012.  Swallow evaluation ordered.  CXR showed pulmonary edema, bilateral effusions.   Type of Study: Bedside swallow  evaluation Diet Prior to this Study: Regular;Thin liquids Temperature Spikes Noted: No Respiratory Status: Nasal cannula History of Recent Intubation: No Behavior/Cognition: Lethargic;Decreased sustained attention;Requires cueing Oral Cavity - Dentition: Edentulous Self-Feeding Abilities: Total assist Patient Positioning: Upright in bed Baseline Vocal Quality: Low vocal intensity;Breathy Volitional Cough: Weak Volitional Swallow: Unable to elicit    Oral/Motor/Sensory Function Overall Oral Motor/Sensory Function: Impaired (gross weakness, pt severely dysarthric)   Ice Chips Ice chips: Not tested   Thin Liquid Thin Liquid: Impaired Presentation: Straw Oral Phase Impairments: Reduced labial seal;Reduced lingual movement/coordination;Impaired anterior to posterior transit Oral Phase Functional Implications: Prolonged oral transit Pharyngeal  Phase Impairments: Throat Clearing - Immediate (wet breathing quality, subtle cough) Other Comments: observed pt being given water by RN with medications    Nectar Thick Nectar Thick Liquid: Not tested   Honey Thick Honey Thick Liquid: Not tested   Puree Puree: Impaired Presentation: Spoon Oral Phase Impairments: Reduced labial seal;Reduced lingual movement/coordination Oral Phase Functional Implications: Prolonged oral transit Pharyngeal Phase Impairments: Suspected delayed Swallow (possible pharyngeal swallow delay) Other Comments: observed pt being given medicine with applesauce-crushed   Solid   GO    Solid: Not tested       Luanna Salk, [facility] SLP [phone]

## 2013-12-28 NOTE — Progress Notes (Signed)
VASCULAR LAB PRELIMINARY  PRELIMINARY  PRELIMINARY  PRELIMINARY  Bilateral lower extremity venous duplex  completed.    Preliminary report:  Bilateral:  No evidence of DVT, superficial thrombosis, or Baker's Cyst.    Monnica Saltsman, RVT 12/28/2013, 11:04 AM

## 2013-12-28 NOTE — Progress Notes (Signed)
Pt has been calm today, resting good. Daughter at bedside now, pt woke very anxious, stating she wasn't getting enough air. Sats 92% on 2 liters, mouth breathing though, increased 02 to 4liters for patient comfort. Ativan given. Will continue to monitor

## 2013-12-28 NOTE — Progress Notes (Signed)
PT Cancellation Note  Patient Details Name: Maria Christian MRN: [id number] DOB: 1924-07-06   Cancelled Treatment:    Reason Eval/Treat Not Completed: PT screened, no needs identified, will sign off (Pt from SNF where she is WC bound, mechanical lift used for transfers. ) PT signing off as pt is at baseline of total care. Per family she hasn't walked in 10 years.    Blondell Reveal Kistler 12/28/2013, 2:12 PM [phone]

## 2013-12-28 NOTE — Progress Notes (Signed)
PT Cancellation Note  Patient Details Name: Maria Christian MRN: [id number] DOB: [birth year]   Cancelled Treatment:    Reason Eval/Treat Not Completed: Medical issues which prohibited therapy (Dopplers to r/o BLE DVT pending. Will follow. )   Philomena Doheny 12/28/2013, 9:52 AM [phone]

## 2013-12-29 ENCOUNTER — Inpatient Hospital Stay (HOSPITAL_COMMUNITY): Payer: Medicare Other

## 2013-12-29 LAB — GLUCOSE, CAPILLARY
GLUCOSE-CAPILLARY: 170 mg/dL — AB (ref 70–99)
GLUCOSE-CAPILLARY: 145 mg/dL — AB (ref 70–99)
Glucose-Capillary: 121 mg/dL — ABNORMAL HIGH (ref 70–99)
Glucose-Capillary: 127 mg/dL — ABNORMAL HIGH (ref 70–99)

## 2013-12-29 LAB — BASIC METABOLIC PANEL
ANION GAP: 13 (ref 5–15)
BUN: 18 mg/dL (ref 6–23)
CHLORIDE: 101 meq/L (ref 96–112)
CO2: 32 meq/L (ref 19–32)
CREATININE: 0.93 mg/dL (ref 0.50–1.10)
Calcium: 9.7 mg/dL (ref 8.4–10.5)
GFR calc Af Amer: 62 mL/min — ABNORMAL LOW (ref >90)
GFR calc non Af Amer: 54 mL/min — ABNORMAL LOW (ref >90)
Glucose, Bld: 177 mg/dL — ABNORMAL HIGH (ref 70–99)
Potassium: 4.1 mEq/L (ref 3.7–5.3)
Sodium: 146 mEq/L (ref 137–147)

## 2013-12-29 LAB — PRO B NATRIURETIC PEPTIDE: Pro B Natriuretic peptide (BNP): 11594 pg/mL — ABNORMAL HIGH (ref 0–450)

## 2013-12-29 MED ORDER — FUROSEMIDE 10 MG/ML IJ SOLN
40.0000 mg | Freq: Three times a day (TID) | INTRAMUSCULAR | Status: DC
  Administered 2013-12-29 – 2013-12-31 (×5): 40 mg via INTRAVENOUS
  Filled 2013-12-29 (×6): qty 4

## 2013-12-29 MED ORDER — FUROSEMIDE 10 MG/ML IJ SOLN
20.0000 mg | Freq: Once | INTRAMUSCULAR | Status: AC
  Administered 2013-12-29: 20 mg via INTRAVENOUS

## 2013-12-29 MED ORDER — LEVOFLOXACIN 750 MG PO TABS
750.0000 mg | ORAL_TABLET | ORAL | Status: DC
  Administered 2013-12-29: 750 mg via ORAL
  Filled 2013-12-29: qty 1

## 2013-12-29 MED ORDER — LEVOFLOXACIN 500 MG PO TABS
750.0000 mg | ORAL_TABLET | ORAL | Status: DC
  Filled 2013-12-29: qty 2

## 2013-12-29 NOTE — Progress Notes (Signed)
[facility] PROGRESS NOTE  Maria Christian:[id number] DOB: 1925/06/01 DOA: 12/27/2013 PCP: Hollace Kinnier, DO  Assessment/Plan: . Acute on chronic respiratory failure with hypoxemia  - Patient apparently has chronic respiratory failure and is on home O2. Her acute hypoxic respiratory failure secondary to acute diastolic heart failure.  - continue with IV lasix.  -Relates SOB this morning, Oxygen sat 92 on 3 L. Will repeat lasix dose, Nebulizer treatment, repeat Chest x ray.    . Acute diastolic CHF (congestive heart failure)  - Chest x-ray does show bilateral pleural effusion and pulmonary edema, BNP is significantly elevated. No fever or leukocytosis to suggest pneumonia. -Continue with IV lasix 40 Mg BID. Extra dose now due to SOB.  -urine out put 2.2 L. Weight 71.8 ----70.5--- -repeat chest x ray now.   Right LE edema: Bilateral DVT negative for DVT.   Marland Kitchen Hypertension  - Currently controlled,  Coreg, and hydralazine. Holder parameter for hydralazine.  -Will hold Norvasc to avoid hypotension, now patient on IV lasix.   . Dilated bowels seen on x-rays/CT of the abdomen  - Has a history of sigmoid volvulus in the past. Abdomen is nondistended. Had multiple BM overnight.  while on Lasix. Monitor closely. Has prior history of C diff also. If diarrhea will check for C diff.   . Atrial fibrillation with controlled ventricular response  - Rate controlled with Coreg, suspect not a anticoagulation candidate given advanced age, fall risk   . Dementia with Parkinson's disease  - At baseline, at risk for delirium while inpatient.  - Continue Aricept, benzodiazepines, trazodone and Zoloft.  -carefull with oversedation.   . Coronary artery disease  - Stable without any chest pain. Troponin negative.  - Continue antiplatelet agents, statins, beta blockers.   . Diabetes mellitus  - Diet controlled as outpatient.  A1c 5.8. SSI.    Code Status: DNR Family Communication: none at  bedside.  Disposition Plan: To be determine.    Consultants:  none  Procedures:  Doppler LE:   Antibiotics:  none  HPI/Subjective: Patient is alert today , she is anxious. She is complaining of dyspnea. She is confuse. She breath trough  Her mouth at time.   Objective: Filed Vitals:   12/29/13 0404  BP: 142/78  Pulse: 87  Temp: 97.8 F (36.6 C)  Resp: 20    Intake/Output Summary (Last 24 hours) at 12/29/13 1016 Last data filed at 12/29/13 0500  Gross per 24 hour  Intake    300 ml  Output   2225 ml  Net  -1925 ml   Filed Weights   12/28/13 0045 12/28/13 0552 12/29/13 0404  Weight: 71.8 kg (158 lb 4.6 oz) 70.5 kg (155 lb 6.8 oz) 70.2 kg (154 lb 12.2 oz)    Exam:   General:  Awake, alert, anxious.   Cardiovascular: S 1, S 2 RRR  Respiratory: bilateral crackles, mildly labor.   Abdomen: BS present, soft, mild distended, no tenderness.   Musculoskeletal: right LE edema.   Data Reviewed: Basic Metabolic Panel:  Recent Labs Lab 12/27/13 2145 12/28/13 0414  NA 141 141  K 4.8 4.7  CL 103 102  CO2 27 30  GLUCOSE 163* 183*  BUN 15 15  CREATININE 0.88 0.90  CALCIUM 9.7 9.6   Liver Function Tests:  Recent Labs Lab 12/27/13 2145  AST 15  ALT 7  ALKPHOS 82  BILITOT 0.3  PROT 7.6  ALBUMIN 3.7    Recent Labs Lab 12/27/13 2145  LIPASE  18   No results found for this basename: AMMONIA,  in the last 168 hours CBC:  Recent Labs Lab 12/27/13 2145 12/28/13 0414  WBC 7.3 7.0  HGB 9.1* 8.6*  HCT 30.3* 28.5*  MCV 111.8* 113.1*  PLT 172 137*   Cardiac Enzymes:  Recent Labs Lab 12/28/13 0412 12/28/13 0659 12/28/13 1250  TROPONINI <0.30 <0.30 <0.30   BNP (last 3 results)  Recent Labs  05/24/13 0250 12/27/13 2146  PROBNP 3915.0* 7477.0*   CBG:  Recent Labs Lab 12/28/13 0102 12/28/13 0800 12/28/13 1220 12/28/13 1735 12/28/13 2127  GLUCAP 158* 148* 154* 72 92    Recent Results (from the past 240 hour(s))  MRSA PCR  SCREENING     Status: None   Collection Time    12/28/13  1:30 AM      Result Value Ref Range Status   MRSA by PCR NEGATIVE  NEGATIVE Final   Comment:            The GeneXpert MRSA Assay (FDA     approved for NASAL specimens     only), is one component of a     comprehensive MRSA colonization     surveillance program. It is not     intended to diagnose MRSA     infection nor to guide or     monitor treatment for     MRSA infections.     Studies: Ct Abdomen Pelvis W Contrast  12/27/2013   CLINICAL DATA:  Constipation in weakness, shortness of breath, abdominal distention  EXAM: CT ABDOMEN AND PELVIS WITH CONTRAST  TECHNIQUE: Multidetector CT imaging of the abdomen and pelvis was performed using the standard protocol following bolus administration of intravenous contrast.  CONTRAST:  146mL OMNIPAQUE IOHEXOL 300 MG/ML  SOLN  COMPARISON:  12/27/2013 plain films, 10/09/2013 CT scan  FINDINGS: There are moderate to large bilateral pleural effusions with underlying compressive atelectasis. There is cardiomegaly.  There is fatty infiltration of the liver. Gallbladder is surgically absent. Spleen is normal. Adrenal glands are normal. There is a horseshoe kidney with out significant abnormalities.  Pancreas is normal. Abdominal aorta is significantly calcified. Stomach and small bowel appear normal. The large bowel is normal except for decompression of the distal most sigmoid and rectum likely due to peristalsis. There is beam attenuation artifact limiting evaluation of the pelvis related to the left hip replacement. There is mild fecal retention throughout the colon.  Allowing for this, bladder appears normal. There is a small volume of ascites in the pelvis. There are no acute musculoskeletal findings.  IMPRESSION: Similar to prior study, findings are most consistent with colonic ileus. There is mild fecal retention.  Moderate to large pleural effusions, larger on the right, with underlying compressive  atelectasis.  Small volume ascites in the pelvis.   Electronically Signed   By: Skipper Cliche M.D.   On: 12/27/2013 23:49   Dg Chest Port 1 View  12/27/2013   CLINICAL DATA:  Shortness of breath with cough and weakness.  EXAM: PORTABLE CHEST - 1 VIEW  COMPARISON:  10/13/2013.  FINDINGS: 2209 hrs. Vascular congestion with associated pulmonary edema. There is bilateral pleural effusions, left greater than right. Cardiopericardial silhouette is markedly enlarged. Bones are diffusely demineralized. Telemetry leads overlie the chest.  IMPRESSION: Cardiomegaly with pulmonary edema and bilateral pleural effusions.   Electronically Signed   By: Misty Stanley M.D.   On: 12/27/2013 22:57   Dg Abd Portable 1v  12/27/2013   CLINICAL DATA:  Abdominal discomfort, weakness and shortness of breath.  EXAM: PORTABLE ABDOMEN - 1 VIEW  COMPARISON:  Abdominal radiograph performed 10/14/2013  FINDINGS: There is slight interval improvement in colonic distention, with apparent stool noted throughout the colon. Scattered air-filled loops of small bowel are also seen. This may reflect residual ileus, improved from the prior study. No free intra-abdominal air is identified, though evaluation for free air is limited on a single supine view. Scattered clips are noted in the right upper quadrant. The stomach is partially filled with air.  Mild degenerative change is noted along the lumbar spine; the sacroiliac joints are unremarkable in appearance. The left hip arthroplasty is incompletely imaged but appears grossly unremarkable.  IMPRESSION: Slight interval improvement in colonic distention, with apparent stool noted throughout the colon. This may reflect residual ileus, improved from the prior study. No free intra-abdominal air seen.   Electronically Signed   By: Garald Balding M.D.   On: 12/27/2013 22:57    Scheduled Meds: . acetaminophen  1,000 mg Oral TID  . antiseptic oral rinse  15 mL Mouth Rinse BID  . aspirin  81 mg Oral  Daily  . carvedilol  3.125 mg Oral Daily  . cholecalciferol  1,000 Units Oral Daily  . clopidogrel  75 mg Oral Daily  . cyanocobalamin  500 mcg Oral Daily  . cyclobenzaprine  5 mg Oral QHS  . donepezil  10 mg Oral QHS  . dorzolamide  1 drop Both Eyes TID  . ferrous sulfate  325 mg Oral Q breakfast  . furosemide  40 mg Intravenous BID  . gabapentin  300 mg Oral BID  . heparin  5,000 Units Subcutaneous 3 times per day  . hydrALAZINE  10 mg Oral TID  . insulin aspart  0-9 Units Subcutaneous TID WC  . latanoprost  2 drop Both Eyes QHS  . loratadine  10 mg Oral Daily  . LORazepam  0.5 mg Oral BID  . polyethylene glycol  17 g Oral Daily  . potassium chloride SA  40 mEq Oral Daily  . senna  1 tablet Oral BID  . sertraline  50 mg Oral Daily  . simvastatin  10 mg Oral q1800  . sodium bicarbonate  1,300 mg Oral TID  . sodium chloride  3 mL Intravenous Q12H  . sodium chloride  3 mL Intravenous Q12H  . traZODone  100 mg Oral QHS  . vitamin C  500 mg Oral Daily   Continuous Infusions:   Active Problems:   Atrial fibrillation with controlled ventricular response   Hypertension   Diabetes mellitus   Coronary artery disease   Dementia   Acute diastolic CHF (congestive heart failure)   Acute on chronic respiratory failure with hypoxemia    Time spent: 35 minutes.     Niel Hummer A  [facility] Pager [phone]. If 7PM-7AM, please contact night-coverage at [email or web address], password [facility] 12/29/2013, 10:16 AM  LOS: 2 days

## 2013-12-29 NOTE — Progress Notes (Signed)
Clinical Social Work Department BRIEF PSYCHOSOCIAL ASSESSMENT 12/29/2013  Patient:  Maria Christian, Maria Christian     Account Number:  [id number]     [name] date:  12/27/2013  Clinical Social Worker:  Renold Genta  Date/Time:  12/29/2013 03:59 PM  Referred by:  Physician  Date Referred:  12/29/2013 Referred for  Other - See comment   Other Referral:   Admitted from: [place] type:  Family Other interview type:   patient's son, Maria Christian via phone    PSYCHOSOCIAL DATA Living Status:  FACILITY Admitted from facility:  [place] [place] Level of care:  [place] Primary support name:  Maria Christian (son) ph#: [phone] Primary support relationship to patient:  CHILD, ADULT Degree of support available:   good    CURRENT CONCERNS Current Concerns  Post-Acute Placement   Other Concerns:    SOCIAL WORK ASSESSMENT / PLAN CSW received consult that patient was admitted from [facility] SN[facility].   Assessment/plan status:  Information/Referral to [employer] Other assessment/ plan:   Information/referral to community resources:   CSW completed FL2 and faxed information out to [facility], confirmed with Tammy @ SNF that they would be able to take patient back when stable.    PATIENT'S/FAMILY'S RESPONSE TO PLAN OF CARE: Patient's son states that he's been pleased with [facility] and plans for her to return there at discharge.       Maria Christian, [facility] Clinical Social Worker cell #: [phone]

## 2013-12-29 NOTE — Progress Notes (Signed)
Speech Language Pathology Treatment: Dysphagia  Patient Details Name: Maria Christian MRN: [id number] DOB: [birth year] Today's Date: 12/29/2013 Time: 0900-0920 SLP Time Calculation (min): 20 min  Assessment / Plan / Recommendation Clinical Impression  Pt with improved respiratory status, mental status today.  She was fully alert and was asking questions re: when she was to have a throat procedure.  RN reports no procedures schedule except SLP visit.  Advised pt to SLP follow up to determine readiness for po intake.    Pt accepted applesauce, moistened graham cracker, juice and water via cup/straw/tsp.  Delayed oral manipulation due to parkinson's noted with suspected delayed pharyngeal swallow.  Use of straw poorly tolerated as pt took sequential boluses and inhaled after with subtle cough - concerning for aspiration.  Frequent belching noted with intake with pt denying backflow - however pt has h/o backflow from UES which multiple swallows helped to clear.  Mildly increased work of breathing noted with minimal intake.   Pt's ability to consume po with vary based primarily on respiratory status.  Recommend continue dys3/ground meats/thin with very strict precautions.  This pt will be a chronic aspiration risk due to her chronic multifactorial dysphagia, Parkinson's with weak nonproductive cough and lack of mobility impacting tolerance.  Advised pt to recommendations and used teach back to reinforce strategies.  Will follow up for dysphagia management/family education, family not present at this time.    HPI HPI: 80 yo female adm to [facility] with cough and AMS.  Diagnosed with CHF - Pt PMH + for Parkinson's disease, oropharyngeal and esophageal dysphagia (presbyesophagus and stricture) requiring dilatation 07/2012.  Swallow evaluation ordered.  CXR showed pulmonary edema, bilateral effusions.     Pertinent Vitals Afebrile, nonproductive cough  SLP Plan  Continue with current plan of care     Recommendations Diet recommendations: Dysphagia 3 (mechanical soft);Thin liquid Liquids provided via: Teaspoon (cup sips of thin water between meals, otherwise use tsp) Medication Administration: Crushed with puree Supervision: Full supervision/cueing for compensatory strategies;Staff to assist with self feeding Compensations: Slow rate;Small sips/bites;Multiple dry swallows after each bite/sip;Follow solids with liquid Postural Changes and/or Swallow Maneuvers: Seated upright 90 degrees;Upright 30-60 min after meal (rest break if short of breath or coughing)              Oral Care Recommendations: Oral care BID Follow up Recommendations: Skilled Nursing facility Plan: Continue with current plan of care    [place], [facility] SLP [phone]

## 2013-12-30 DIAGNOSIS — A0472 Enterocolitis due to Clostridium difficile, not specified as recurrent: Secondary | ICD-10-CM

## 2013-12-30 LAB — CBC
HEMATOCRIT: 29.8 % — AB (ref 36.0–46.0)
Hemoglobin: 9.2 g/dL — ABNORMAL LOW (ref 12.0–15.0)
MCH: 33.8 pg (ref 26.0–34.0)
MCHC: 30.9 g/dL (ref 30.0–36.0)
MCV: 109.6 fL — ABNORMAL HIGH (ref 78.0–100.0)
Platelets: 137 10*3/uL — ABNORMAL LOW (ref 150–400)
RBC: 2.72 MIL/uL — ABNORMAL LOW (ref 3.87–5.11)
RDW: 14.3 % (ref 11.5–15.5)
WBC: 4.2 10*3/uL (ref 4.0–10.5)

## 2013-12-30 LAB — BASIC METABOLIC PANEL
Anion gap: 8 (ref 5–15)
BUN: 14 mg/dL (ref 6–23)
CALCIUM: 9.1 mg/dL (ref 8.4–10.5)
CO2: 39 meq/L — AB (ref 19–32)
Chloride: 93 mEq/L — ABNORMAL LOW (ref 96–112)
Creatinine, Ser: 0.84 mg/dL (ref 0.50–1.10)
GFR calc Af Amer: 70 mL/min — ABNORMAL LOW (ref >90)
GFR calc non Af Amer: 61 mL/min — ABNORMAL LOW (ref >90)
Glucose, Bld: 152 mg/dL — ABNORMAL HIGH (ref 70–99)
Potassium: 2.8 mEq/L — CL (ref 3.7–5.3)
Sodium: 140 mEq/L (ref 137–147)

## 2013-12-30 LAB — GLUCOSE, CAPILLARY
Glucose-Capillary: 96 mg/dL (ref 70–99)
Glucose-Capillary: 171 mg/dL — ABNORMAL HIGH (ref 70–99)
Glucose-Capillary: 135 mg/dL — ABNORMAL HIGH (ref 70–99)

## 2013-12-30 LAB — CLOSTRIDIUM DIFFICILE BY PCR: Toxigenic C. Difficile by PCR: POSITIVE — AB

## 2013-12-30 MED ORDER — VANCOMYCIN 50 MG/ML ORAL SOLUTION
500.0000 mg | Freq: Four times a day (QID) | ORAL | Status: DC
  Administered 2013-12-30 – 2014-01-05 (×25): 500 mg via ORAL
  Filled 2013-12-30 (×28): qty 10

## 2013-12-30 MED ORDER — METRONIDAZOLE IN NACL 5-0.79 MG/ML-% IV SOLN
500.0000 mg | Freq: Three times a day (TID) | INTRAVENOUS | Status: DC
  Administered 2013-12-30 – 2014-01-03 (×12): 500 mg via INTRAVENOUS
  Filled 2013-12-30 (×13): qty 100

## 2013-12-30 MED ORDER — POTASSIUM CHLORIDE CRYS ER 20 MEQ PO TBCR
40.0000 meq | EXTENDED_RELEASE_TABLET | Freq: Once | ORAL | Status: AC
  Administered 2013-12-30: 40 meq via ORAL
  Filled 2013-12-30: qty 2

## 2013-12-30 MED ORDER — POTASSIUM CHLORIDE 10 MEQ/100ML IV SOLN
10.0000 meq | INTRAVENOUS | Status: AC
  Administered 2013-12-30 (×3): 10 meq via INTRAVENOUS
  Filled 2013-12-30 (×3): qty 100

## 2013-12-30 NOTE — Progress Notes (Signed)
Speech Language Pathology Treatment: Dysphagia  Patient Details Name: Maria Christian MRN: [id number] DOB: 1924/06/07 Today's Date: 12/30/2013 Time: 5436-0677 SLP Time Calculation (min): 13 min  Assessment / Plan / Recommendation Clinical Impression  Pt with much improved mental status today and does not appear short of breath or congested.  RN reports improved CHF and good tolerance of po but poor intake.  Observed pt consuming apple juice via cup/straw- swallow was timely with clear voice throughout.  At this time, recommend to lift liquid administration precautions due to markedly improved status.  Continue dys3/thin diet due to edentulous status and compensations listed in room due to esophageal deficits.  RN informed.   Will sign off as pt likely with baseline swallow ability currently.      HPI HPI: 80 yo female adm to [facility] with cough and AMS.  Diagnosed with CHF - Pt PMH + for Parkinson's disease, oropharyngeal and esophageal dysphagia (presbyesophagus and stricture) requiring dilatation 07/2012.  Swallow evaluation ordered.  CXR showed pulmonary edema, bilateral effusions.     Pertinent Vitals Afebrile, decreased  SLP Plan  All goals met    Recommendations Diet recommendations: Dysphagia 3 (mechanical soft);Thin liquid Liquids provided via: Cup;Straw Medication Administration: Whole meds with puree (whole if small, crush if large and not contraindicated) Supervision: Full supervision/cueing for compensatory strategies;Staff to assist with self feeding Compensations: Slow rate;Small sips/bites;Follow solids with liquid (intermittent dry swallow as able) Postural Changes and/or Swallow Maneuvers: Seated upright 90 degrees;Upright 30-60 min after meal (rest break if coughing or short of breath)              Oral Care Recommendations: Oral care BID Follow up Recommendations: Skilled Nursing facility Plan: All goals met    [place], [place] [facility] SLP [phone]

## 2013-12-30 NOTE — Progress Notes (Signed)
MD made aware of positive result for cdiff.

## 2013-12-30 NOTE — Progress Notes (Signed)
[facility] PROGRESS NOTE  Maria Christian BDZ:[id number] DOB: 02-17-1925 DOA: 12/27/2013 PCP: Hollace Kinnier, DO  Assessment/Plan: . Acute on chronic respiratory failure with hypoxemia  - Patient apparently has chronic respiratory failure and is on home O2. Her acute hypoxic respiratory failure secondary to acute diastolic heart failure.  - continue with IV lasix 40 Mg IV TID.  -Breathing better.  -Chest x ray with areas of infiltrate. will repeat chest x ray after diuresis. DC Levaquin due to positive C diff.   C diff;  Developed diarrhea overnight. C diff Positive. Start Flagyl and vancomycin. prior history of c diff.   . Acute diastolic CHF (congestive heart failure)  - Chest x-ray does show bilateral pleural effusion and pulmonary edema, BNP is significantly elevated. No fever or leukocytosis to suggest pneumonia. -Continue with IV lasix 40 Mg TID.  -urine out put 2.2 L. Weight 71.8 ----70.5---66  Right LE edema: Bilateral DVT negative for DVT.   Marland Kitchen Hypertension  - Currently controlled,  Coreg, and hydralazine. Holder parameter for hydralazine.  -Will hold Norvasc to avoid hypotension, now patient on IV lasix.   . Dilated bowels seen on x-rays/CT of the abdomen ; C diff positive. Started on vancomycin and flagyl.  - Has a history of sigmoid volvulus in the past. Abdomen is nondistended. Had multiple BM overnight.  while on Lasix. Monitor closely. Has prior history of C diff also.   . Atrial fibrillation with controlled ventricular response  - Rate controlled with Coreg, suspect not a anticoagulation candidate given advanced age, fall risk   . Dementia with Parkinson's disease  - At baseline, at risk for delirium while inpatient.  - Continue Aricept, benzodiazepines, trazodone and Zoloft.  -carefull with oversedation.   . Coronary artery disease  - Stable without any chest pain. Troponin negative.  - Continue antiplatelet agents, statins, beta blockers.   . Diabetes  mellitus  - Diet controlled as outpatient.  A1c 5.8. SSI.    Code Status: DNR Family Communication: none at bedside.  Disposition Plan: To be determine.    Consultants:  none  Procedures:  Doppler LE:   Antibiotics:  none  HPI/Subjective: Patient appears more comfortable today. She appears to be with  less tachypnea.  Breathing better. Had large watery bowel movement overnight.   Objective: Filed Vitals:   12/30/13 1447  BP: 149/71  Pulse: 84  Temp: 98.2 F (36.8 C)  Resp: 17    Intake/Output Summary (Last 24 hours) at 12/30/13 1635 Last data filed at 12/30/13 0834  Gross per 24 hour  Intake     80 ml  Output   1950 ml  Net  -1870 ml   Filed Weights   12/28/13 0552 12/29/13 0404 12/30/13 0545  Weight: 70.5 kg (155 lb 6.8 oz) 70.2 kg (154 lb 12.2 oz) 66.8 kg (147 lb 4.3 oz)    Exam:   General:  Awake, alert, anxious.   Cardiovascular: S 1, S 2 RRR  Respiratory: bilateral crackles, mildly labor.   Abdomen: BS present, soft, mild distended, no tenderness.   Musculoskeletal: right LE edema.   Data Reviewed: Basic Metabolic Panel:  Recent Labs Lab 12/27/13 2145 12/28/13 0414 12/29/13 1039  NA 141 141 146  K 4.8 4.7 4.1  CL 103 102 101  CO2 27 30 32  GLUCOSE 163* 183* 177*  BUN 15 15 18   CREATININE 0.88 0.90 0.93  CALCIUM 9.7 9.6 9.7   Liver Function Tests:  Recent Labs Lab 12/27/13 2145  AST  15  ALT 7  ALKPHOS 82  BILITOT 0.3  PROT 7.6  ALBUMIN 3.7    Recent Labs Lab 12/27/13 2145  LIPASE 18   No results found for this basename: AMMONIA,  in the last 168 hours CBC:  Recent Labs Lab 12/27/13 2145 12/28/13 0414 12/30/13 0900  WBC 7.3 7.0 4.2  HGB 9.1* 8.6* 9.2*  HCT 30.3* 28.5* 29.8*  MCV 111.8* 113.1* 109.6*  PLT 172 137* 137*   Cardiac Enzymes:  Recent Labs Lab 12/28/13 0412 12/28/13 0659 12/28/13 1250  TROPONINI <0.30 <0.30 <0.30   BNP (last 3 results)  Recent Labs  05/24/13 0250 12/27/13 2146  12/29/13 1039  PROBNP 3915.0* 7477.0* 11594.0*   CBG:  Recent Labs Lab 12/29/13 1157 12/29/13 1705 12/29/13 2151 12/30/13 0805 12/30/13 1214  GLUCAP 170* 145* 127* 96 171*    Recent Results (from the past 240 hour(s))  MRSA PCR SCREENING     Status: None   Collection Time    12/28/13  1:30 AM      Result Value Ref Range Status   MRSA by PCR NEGATIVE  NEGATIVE Final   Comment:            The GeneXpert MRSA Assay (FDA     approved for NASAL specimens     only), is one component of a     comprehensive MRSA colonization     surveillance program. It is not     intended to diagnose MRSA     infection nor to guide or     monitor treatment for     MRSA infections.  CLOSTRIDIUM DIFFICILE BY PCR     Status: Abnormal   Collection Time    12/29/13 10:19 PM      Result Value Ref Range Status   C difficile by pcr POSITIVE (*) NEGATIVE Final   Comment: CRITICAL RESULT CALLED TO, READ BACK BY AND VERIFIED WITH:     G.SISON,RN 12/30/13 0825BY BSLADE     Performed at [facility]     Studies: Dg Chest Port 1 View  12/29/2013   CLINICAL DATA:  Difficulty breathing  EXAM: PORTABLE CHEST - 1 VIEW  COMPARISON:  December 27, 2013  FINDINGS: There is cardiomegaly with bilateral effusions. There is patchy interstitial and alveolar edema bilaterally. No adenopathy. There is atherosclerotic change throughout the aorta. There is acromioclavicular separation on the left. There is evidence of an old right clavicle fracture.  IMPRESSION: Evidence of congestive heart failure. Areas of airspace opacity potentially could represent superimposed pneumonia. Chronic appearing acromioclavicular separation on the left.   Electronically Signed   By: Lowella Grip M.D.   On: 12/29/2013 10:43    Scheduled Meds: . acetaminophen  1,000 mg Oral TID  . antiseptic oral rinse  15 mL Mouth Rinse BID  . aspirin  81 mg Oral Daily  . carvedilol  3.125 mg Oral Daily  . cholecalciferol  1,000 Units Oral Daily  .  clopidogrel  75 mg Oral Daily  . cyanocobalamin  500 mcg Oral Daily  . cyclobenzaprine  5 mg Oral QHS  . donepezil  10 mg Oral QHS  . dorzolamide  1 drop Both Eyes TID  . ferrous sulfate  325 mg Oral Q breakfast  . furosemide  40 mg Intravenous TID PC  . gabapentin  300 mg Oral BID  . heparin  5,000 Units Subcutaneous 3 times per day  . hydrALAZINE  10 mg Oral TID  . insulin aspart  0-9  Units Subcutaneous TID WC  . latanoprost  2 drop Both Eyes QHS  . levofloxacin  750 mg Oral Q48H  . loratadine  10 mg Oral Daily  . LORazepam  0.5 mg Oral BID  . vancomycin  500 mg Oral 4 times per day   And  . metronidazole  500 mg Intravenous 3 times per day  . polyethylene glycol  17 g Oral Daily  . potassium chloride SA  40 mEq Oral Daily  . senna  1 tablet Oral BID  . sertraline  50 mg Oral Daily  . simvastatin  10 mg Oral q1800  . sodium bicarbonate  1,300 mg Oral TID  . sodium chloride  3 mL Intravenous Q12H  . sodium chloride  3 mL Intravenous Q12H  . traZODone  100 mg Oral QHS  . vitamin C  500 mg Oral Daily   Continuous Infusions:   Active Problems:   Atrial fibrillation with controlled ventricular response   Hypertension   Diabetes mellitus   Coronary artery disease   Dementia   Acute diastolic CHF (congestive heart failure)   Acute on chronic respiratory failure with hypoxemia    Time spent: 35 minutes.     Niel Hummer A  [facility] Pager [phone]. If 7PM-7AM, please contact night-coverage at [email or web address], password [facility] 12/30/2013, 4:35 PM  LOS: 3 days

## 2013-12-31 LAB — CBC
HEMATOCRIT: 26.5 % — AB (ref 36.0–46.0)
HEMOGLOBIN: 8.2 g/dL — AB (ref 12.0–15.0)
MCH: 33.6 pg (ref 26.0–34.0)
MCHC: 30.9 g/dL (ref 30.0–36.0)
MCV: 108.6 fL — AB (ref 78.0–100.0)
Platelets: 130 10*3/uL — ABNORMAL LOW (ref 150–400)
RBC: 2.44 MIL/uL — ABNORMAL LOW (ref 3.87–5.11)
RDW: 14.2 % (ref 11.5–15.5)
WBC: 4.1 10*3/uL (ref 4.0–10.5)

## 2013-12-31 LAB — BASIC METABOLIC PANEL
Anion gap: 9 (ref 5–15)
BUN: 13 mg/dL (ref 6–23)
CHLORIDE: 97 meq/L (ref 96–112)
CO2: 37 meq/L — AB (ref 19–32)
Calcium: 9 mg/dL (ref 8.4–10.5)
Creatinine, Ser: 0.92 mg/dL (ref 0.50–1.10)
GFR calc Af Amer: 63 mL/min — ABNORMAL LOW (ref >90)
GFR calc non Af Amer: 54 mL/min — ABNORMAL LOW (ref >90)
GLUCOSE: 122 mg/dL — AB (ref 70–99)
Potassium: 3.2 mEq/L — ABNORMAL LOW (ref 3.7–5.3)
Sodium: 143 mEq/L (ref 137–147)

## 2013-12-31 LAB — GLUCOSE, CAPILLARY
GLUCOSE-CAPILLARY: 176 mg/dL — AB (ref 70–99)
GLUCOSE-CAPILLARY: 155 mg/dL — AB (ref 70–99)
GLUCOSE-CAPILLARY: 132 mg/dL — AB (ref 70–99)
Glucose-Capillary: 106 mg/dL — ABNORMAL HIGH (ref 70–99)
Glucose-Capillary: 138 mg/dL — ABNORMAL HIGH (ref 70–99)

## 2013-12-31 LAB — CLOSTRIDIUM DIFFICILE BY PCR: Toxigenic C. Difficile by PCR: POSITIVE — AB

## 2013-12-31 MED ORDER — POTASSIUM CHLORIDE CRYS ER 20 MEQ PO TBCR
40.0000 meq | EXTENDED_RELEASE_TABLET | Freq: Two times a day (BID) | ORAL | Status: AC
  Administered 2013-12-31 (×2): 40 meq via ORAL
  Filled 2013-12-31 (×2): qty 2

## 2013-12-31 MED ORDER — FUROSEMIDE 10 MG/ML IJ SOLN
40.0000 mg | Freq: Two times a day (BID) | INTRAMUSCULAR | Status: DC
  Administered 2013-12-31 – 2014-01-04 (×9): 40 mg via INTRAVENOUS
  Filled 2013-12-31 (×8): qty 4

## 2013-12-31 MED ORDER — GLUCERNA SHAKE PO LIQD
237.0000 mL | Freq: Two times a day (BID) | ORAL | Status: DC
  Administered 2013-12-31 – 2014-01-05 (×9): 237 mL via ORAL
  Filled 2013-12-31 (×11): qty 237

## 2013-12-31 MED ORDER — POTASSIUM CHLORIDE CRYS ER 20 MEQ PO TBCR
40.0000 meq | EXTENDED_RELEASE_TABLET | Freq: Three times a day (TID) | ORAL | Status: DC
  Filled 2013-12-31 (×3): qty 2

## 2013-12-31 NOTE — Progress Notes (Signed)
[facility] PROGRESS NOTE  Maria Christian JKK:[id number] DOB: 1924/12/29 DOA: 12/27/2013 PCP: Hollace Kinnier, DO  Assessment/Plan: Maria Christian is a 80 y.o. female with a Past Medical History of chronic diastolic heart failure, atrial fibrillation not on anticoagulation secondary to risk of falls, dementia, diabetes, Parkinson's disease who presents with shortness of breath-1 week-but worsening for the past 3-4 days prior to admission. She was diagnosed with acute on chronic diastolic heart failure exacerbation. She was started on IV lasix 40 BID.  She developed worsening SOB for this reason lasix was change to TID. Her respiratory status has improved. She is now requiring 2 L of oxygen, initially she was on 4 L. She was diagnosed during this admission with recurrent C diff. She was started on IV flagyl and oral flagyl. She had prior history of ileus and volvulus.   Acute on chronic respiratory failure with hypoxemia  - Patient apparently has chronic respiratory failure and is on home O2. Her acute hypoxic respiratory failure secondary to acute diastolic heart failure.  -secondary to heart failure exacerbation.  -Breathing better. Change lasix to 40 mg IV BID from TID.  -Chest x ray with areas of infiltrate. will repeat chest x ray 7-30 after diuresis. DC Levaquin due to positive C diff.   C diff; recurrent.  Developed diarrhea overnight. C diff Positive. Started Flagyl and vancomycin on 7-28. prior history of c diff.  Day 2 antibiotics. Diarrhea improving.    Hypokalemia: KCL 40 meq BID time 2 dose, need potasium supplement while on lasix.   Acute diastolic CHF (congestive heart failure)  - Chest x-ray does show bilateral pleural effusion and pulmonary edema, BNP is significantly elevated. No fever or leukocytosis to suggest pneumonia. -will change IV lasix 40 Mg TID to BID. Might be able to change to oral lasix on 7-30 depending on chest x ray results, and clinical finding.  -urine  out put 2.2 L. Weight 71.8 ----70.5---66--66  Dilated bowels seen on x-rays/CT of the abdomen ; C diff positive. Started on vancomycin and flagyl.  - Has a history of sigmoid volvulus in the past. Abdomen is nondistended. Had multiple BM overnight.  while on Lasix. Monitor closely. Has prior history of C diff also.   Right LE edema: Bilateral DVT negative for DVT.   Hypertension  - Currently controlled,  Coreg, and hydralazine. Holder parameter for hydralazine.  -Will hold Norvasc to avoid hypotension, now patient on IV lasix.    Atrial fibrillation with controlled ventricular response  - Rate controlled with Coreg, suspect not a anticoagulation candidate given advanced age, fall risk   Dementia with Parkinson's disease  - At baseline, at risk for delirium while inpatient.  - Continue Aricept, benzodiazepines, trazodone and Zoloft.  -carefull with oversedation.    Coronary artery disease  - Stable without any chest pain. Troponin negative.  - Continue antiplatelet agents, statins, beta blockers.   Diabetes mellitus  - Diet controlled as outpatient.  A1c 5.8. SSI.    Code Status: DNR Family Communication: none at bedside.  Disposition Plan: To be determine.    Consultants:  none  Procedures:  Doppler LE:   Antibiotics:  none  HPI/Subjective: Appears more calm, breathing better, denies pain. Only one BM documented so far.   Objective: Filed Vitals:   12/31/13 0742  BP: 156/75  Pulse: 80  Temp: 98.5 F (36.9 C)  Resp: 20    Intake/Output Summary (Last 24 hours) at 12/31/13 1510 Last data filed at 12/31/13 [phone]  Gross per 24 hour  Intake    140 ml  Output   2175 ml  Net  -2035 ml   Filed Weights   12/28/13 0552 12/29/13 0404 12/30/13 0545  Weight: 70.5 kg (155 lb 6.8 oz) 70.2 kg (154 lb 12.2 oz) 66.8 kg (147 lb 4.3 oz)    Exam:   General:  Awake, alert, calm.   Cardiovascular: S 1, S 2 RRR  Respiratory: bilateral crackles, unlabored breathing.    Abdomen: BS present, soft, mild distended, no tenderness.   Musculoskeletal: right LE edema.   Data Reviewed: Basic Metabolic Panel:  Recent Labs Lab 12/27/13 2145 12/28/13 0414 12/29/13 1039 12/30/13 1714 12/31/13 0445  NA 141 141 146 140 143  K 4.8 4.7 4.1 2.8* 3.2*  CL 103 102 101 93* 97  CO2 27 30 32 39* 37*  GLUCOSE 163* 183* 177* 152* 122*  BUN 15 15 18 14 13   CREATININE 0.88 0.90 0.93 0.84 0.92  CALCIUM 9.7 9.6 9.7 9.1 9.0   Liver Function Tests:  Recent Labs Lab 12/27/13 2145  AST 15  ALT 7  ALKPHOS 82  BILITOT 0.3  PROT 7.6  ALBUMIN 3.7    Recent Labs Lab 12/27/13 2145  LIPASE 18   No results found for this basename: AMMONIA,  in the last 168 hours CBC:  Recent Labs Lab 12/27/13 2145 12/28/13 0414 12/30/13 0900 12/31/13 0445  WBC 7.3 7.0 4.2 4.1  HGB 9.1* 8.6* 9.2* 8.2*  HCT 30.3* 28.5* 29.8* 26.5*  MCV 111.8* 113.1* 109.6* 108.6*  PLT 172 137* 137* 130*   Cardiac Enzymes:  Recent Labs Lab 12/28/13 0412 12/28/13 0659 12/28/13 1250  TROPONINI <0.30 <0.30 <0.30   BNP (last 3 results)  Recent Labs  05/24/13 0250 12/27/13 2146 12/29/13 1039  PROBNP 3915.0* 7477.0* 11594.0*   CBG:  Recent Labs Lab 12/30/13 1214 12/30/13 1646 12/31/13 0003 12/31/13 0745 12/31/13 1122  GLUCAP 171* 135* 138* 106* 176*    Recent Results (from the past 240 hour(s))  MRSA PCR SCREENING     Status: None   Collection Time    12/28/13  1:30 AM      Result Value Ref Range Status   MRSA by PCR NEGATIVE  NEGATIVE Final   Comment:            The GeneXpert MRSA Assay (FDA     approved for NASAL specimens     only), is one component of a     comprehensive MRSA colonization     surveillance program. It is not     intended to diagnose MRSA     infection nor to guide or     monitor treatment for     MRSA infections.  CLOSTRIDIUM DIFFICILE BY PCR     Status: Abnormal   Collection Time    12/29/13 10:19 PM      Result Value Ref Range Status    C difficile by pcr POSITIVE (*) NEGATIVE Final   Comment: CRITICAL RESULT CALLED TO, READ BACK BY AND VERIFIED WITH:     G.SISON,RN 12/30/13 0825BY BSLADE     Performed at [place] DIFFICILE BY PCR     Status: Abnormal   Collection Time    12/31/13  8:17 AM      Result Value Ref Range Status   C difficile by pcr POSITIVE (*) NEGATIVE Final   Comment: CRITICAL RESULT CALLED TO, READ BACK BY AND VERIFIED WITH:     G.  FASON RN 11:45 12/31/13 ([email or web address])     Performed at [facility]     Studies: No results found.  Scheduled Meds: . acetaminophen  1,000 mg Oral TID  . antiseptic oral rinse  15 mL Mouth Rinse BID  . aspirin  81 mg Oral Daily  . carvedilol  3.125 mg Oral Daily  . cholecalciferol  1,000 Units Oral Daily  . clopidogrel  75 mg Oral Daily  . cyanocobalamin  500 mcg Oral Daily  . cyclobenzaprine  5 mg Oral QHS  . donepezil  10 mg Oral QHS  . dorzolamide  1 drop Both Eyes TID  . feeding supplement (GLUCERNA SHAKE)  237 mL Oral BID BM  . ferrous sulfate  325 mg Oral Q breakfast  . furosemide  40 mg Intravenous BID  . gabapentin  300 mg Oral BID  . heparin  5,000 Units Subcutaneous 3 times per day  . hydrALAZINE  10 mg Oral TID  . insulin aspart  0-9 Units Subcutaneous TID WC  . latanoprost  2 drop Both Eyes QHS  . loratadine  10 mg Oral Daily  . LORazepam  0.5 mg Oral BID  . vancomycin  500 mg Oral 4 times per day   And  . metronidazole  500 mg Intravenous 3 times per day  . potassium chloride SA  40 mEq Oral BID  . senna  1 tablet Oral BID  . sertraline  50 mg Oral Daily  . simvastatin  10 mg Oral q1800  . sodium chloride  3 mL Intravenous Q12H  . sodium chloride  3 mL Intravenous Q12H  . traZODone  100 mg Oral QHS  . vitamin C  500 mg Oral Daily   Continuous Infusions:   Active Problems:   Atrial fibrillation with controlled ventricular response   Hypertension   Diabetes mellitus   Coronary artery disease   Dementia   Acute  diastolic CHF (congestive heart failure)   Acute on chronic respiratory failure with hypoxemia    Time spent: 25 minutes.     Niel Hummer A  [facility] Pager [phone]. If 7PM-7AM, please contact night-coverage at [email or web address], password [facility] 12/31/2013, 3:10 PM  LOS: 4 days

## 2013-12-31 NOTE — Progress Notes (Signed)
INITIAL NUTRITION ASSESSMENT  DOCUMENTATION CODES Per approved criteria  -Not Applicable   INTERVENTION: -Glucerna shake BID, each provides 220 kcal, 10 g protein -Encourage adequate intake of meals and supplements  NUTRITION DIAGNOSIS: Inadequate oral intake related to decreased appetite as evidenced by <40% intake of meals.   Goal: Patient will meet >/=90% of estimated nutrition needs  Monitor:  PO intake, weight, labs  Reason for Assessment: Low Braden  80 y.o. female  Admitting Dx: Acute on chronic respiratory failure  ASSESSMENT: 80 year old female with history of chronic diastolic heart failure, atrial fibrillation, dementia, diabetes, Parkinson's disease presents with shortness of breath for 1 week.   Patient with multiple BM, positive for C. Diff.  Patient is a poor historian, but she does state that her appetite is fair. She was seen by SLP, recommended Dysphagia 3, thin liquid diet. Per chart review, patient is eating 20-40% of her meals. She is open to trying nutrition supplements. Patient unable to report her appetite prior to admission or recent weight loss.  Pressure ulcer on sacrum.  No evidence of muscle or fat depletion.   Height: Ht Readings from Last 1 Encounters:  12/28/13 5\' 6"  (1.676 m)    Weight: Wt Readings from Last 1 Encounters:  12/30/13 147 lb 4.3 oz (66.8 kg)    Ideal Body Weight: 130 pounds  % Ideal Body Weight: 113%  Wt Readings from Last 10 Encounters:  12/30/13 147 lb 4.3 oz (66.8 kg)  12/10/13 158 lb (71.668 kg)  10/23/13 154 lb 1.6 oz (69.9 kg)  10/23/13 154 lb 1.6 oz (69.9 kg)  07/28/13 172 lb 8 oz (78.245 kg)  06/02/13 172 lb 2.9 oz (78.1 kg)  06/02/13 172 lb 2.9 oz (78.1 kg)  06/02/13 172 lb 2.9 oz (78.1 kg)  02/14/13 152 lb (68.947 kg)  12/19/12 153 lb 9.6 oz (69.673 kg)    Usual Body Weight: 152 pounds  % Usual Body Weight: 97%  BMI:  Body mass index is 23.78 kg/(m^2). Patient is normal weight.   Estimated  Nutritional Needs: Kcal: 1450-1550 kcal Protein: 80-95 g Fluid: >2.0 L/day  Skin: Pressure ulcer, sacrum, edema right leg  Diet Order: Dysphagia 3, thin liquids  EDUCATION NEEDS: -No education needs identified at this time   Intake/Output Summary (Last 24 hours) at 12/31/13 1228 Last data filed at 12/31/13 0859  Gross per 24 hour  Intake    140 ml  Output   3775 ml  Net  -3635 ml    Last BM: 7/29   Labs:   Recent Labs Lab 12/29/13 1039 12/30/13 1714 12/31/13 0445  NA 146 140 143  K 4.1 2.8* 3.2*  CL 101 93* 97  CO2 32 39* 37*  BUN 18 14 13   CREATININE 0.93 0.84 0.92  CALCIUM 9.7 9.1 9.0  GLUCOSE 177* 152* 122*    CBG (last 3)   Recent Labs  12/31/13 0003 12/31/13 0745 12/31/13 1122  GLUCAP 138* 106* 176*    Scheduled Meds: . acetaminophen  1,000 mg Oral TID  . antiseptic oral rinse  15 mL Mouth Rinse BID  . aspirin  81 mg Oral Daily  . carvedilol  3.125 mg Oral Daily  . cholecalciferol  1,000 Units Oral Daily  . clopidogrel  75 mg Oral Daily  . cyanocobalamin  500 mcg Oral Daily  . cyclobenzaprine  5 mg Oral QHS  . donepezil  10 mg Oral QHS  . dorzolamide  1 drop Both Eyes TID  . ferrous  sulfate  325 mg Oral Q breakfast  . furosemide  40 mg Intravenous TID PC  . gabapentin  300 mg Oral BID  . heparin  5,000 Units Subcutaneous 3 times per day  . hydrALAZINE  10 mg Oral TID  . insulin aspart  0-9 Units Subcutaneous TID WC  . latanoprost  2 drop Both Eyes QHS  . loratadine  10 mg Oral Daily  . LORazepam  0.5 mg Oral BID  . vancomycin  500 mg Oral 4 times per day   And  . metronidazole  500 mg Intravenous 3 times per day  . potassium chloride SA  40 mEq Oral BID  . senna  1 tablet Oral BID  . sertraline  50 mg Oral Daily  . simvastatin  10 mg Oral q1800  . sodium chloride  3 mL Intravenous Q12H  . sodium chloride  3 mL Intravenous Q12H  . traZODone  100 mg Oral QHS  . vitamin C  500 mg Oral Daily    Continuous Infusions:   Past Medical  History  Diagnosis Date  . Atrial fibrillation   . Altered mental status   . Encephalopathy   . Parkinson disease   . Hypertension   . GERD (gastroesophageal reflux disease)   . Hyperlipemia   . Diabetes mellitus   . Coronary artery disease   . History of adenomatous polyp of colon 06/24/99  . Enlarged heart   . DEMENTIA   . Edema of lower extremity 07/13/11    right leg more swollen than left leg  . Esophageal dysmotility 07/02/12  . Horseshoe kidney   . Pancreatic lesion 05/22/11    no further workup  per PCP/family due to age  . Anemia   . Peripheral neuropathy   . Depression   . Thrombophlebitis     Past Surgical History  Procedure Laterality Date  . Shoulder surgery  2001    left clavicle excision and acromioplasty  . Abdominal hysterectomy    . Cholecystectomy    . Total hip arthroplasty    . Breast surgery      2 benign tumors removed left breast  . Foot surgery      benign tumors from foot  . Esophageal dilation      several times by Dr. [name]  . Nose surgery    . Esophagogastroduodenoscopy (egd) with esophageal dilation N/A 08/01/2012    Procedure: ESOPHAGOGASTRODUODENOSCOPY (EGD) WITH ESOPHAGEAL DILATION;  Surgeon: [name], MD;  Location: [facility];  Service: Endoscopy;  Laterality: N/A;  with c-arm savory dilators  . Flexible sigmoidoscopy N/A 05/24/2013    Procedure: FLEXIBLE SIGMOIDOSCOPY;  Surgeon: [name], MD;  Location: [facility];  Service: Endoscopy;  Laterality: N/A;  . Flexible sigmoidoscopy N/A 05/26/2013    Procedure:  flex with decompression of sigmoid volvulus;  Surgeon: [name], MD;  Location: [facility];  Service: Endoscopy;  Laterality: N/A;    Larey Seat, RD, LDN Pager #: [phone] After-Hours Pager #: [phone]

## 2014-01-01 ENCOUNTER — Inpatient Hospital Stay (HOSPITAL_COMMUNITY): Payer: Medicare Other

## 2014-01-01 ENCOUNTER — Encounter (HOSPITAL_COMMUNITY): Payer: Self-pay | Admitting: Radiology

## 2014-01-01 LAB — GLUCOSE, CAPILLARY
GLUCOSE-CAPILLARY: 111 mg/dL — AB (ref 70–99)
Glucose-Capillary: 137 mg/dL — ABNORMAL HIGH (ref 70–99)
Glucose-Capillary: 140 mg/dL — ABNORMAL HIGH (ref 70–99)
Glucose-Capillary: 121 mg/dL — ABNORMAL HIGH (ref 70–99)

## 2014-01-01 MED ORDER — SACCHAROMYCES BOULARDII 250 MG PO CAPS
250.0000 mg | ORAL_CAPSULE | Freq: Two times a day (BID) | ORAL | Status: DC
  Administered 2014-01-02 – 2014-01-05 (×7): 250 mg via ORAL
  Filled 2014-01-01 (×10): qty 1

## 2014-01-01 MED ORDER — IOHEXOL 300 MG/ML  SOLN
80.0000 mL | Freq: Once | INTRAMUSCULAR | Status: AC | PRN
  Administered 2014-01-01: 80 mL via INTRAVENOUS

## 2014-01-01 NOTE — Progress Notes (Addendum)
[facility] PROGRESS NOTE  Maria Christian SWF:[id number] DOB: 10/21/1924 DOA: 12/27/2013 PCP: Hollace Kinnier, DO  Assessment/Plan: . Acute on chronic respiratory failure with hypoxemia  - Patient apparently has chronic respiratory failure and is on home O2. Her acute hypoxic respiratory failure secondary to acute diastolic heart failure.  - continue with IV lasix 40 Mg IV BID.  -Breathing better.  -Chest x ray with areas of infiltrate. Levaquin was dc'ed 7/29 due to positive C diff.  -repeat CXR today 7/30 Slightly improved pulmonary edema and R. Pleural effusion appearing partially loculated.  Marland Kitchen?Partially loculated R. Pleural Effusion -will obtain CT to further eval  C diff;  Continue Flagyl and vancomycin. prior history of c diff.  -will add probiotic/florastor and follow  . Acute diastolic CHF (congestive heart failure)  - Chest x-ray does show bilateral pleural effusion and pulmonary edema, BNP is significantly elevated. No fever or leukocytosis to suggest pneumonia. -Continue with IV lasix 40 Mg TID.  -urine out put 2.2 L. Weight 71.8 ----70.5---66  Right LE edema: Bilateral DVT negative for DVT.   Marland Kitchen Hypertension  - Currently controlled,  Coreg, and hydralazine. Holder parameter for hydralazine.  -Will hold Norvasc to avoid hypotension, now patient on IV lasix.   . Dilated bowels seen on x-rays/CT of the abdomen ; C diff positive. Started on vancomycin and flagyl.  - Has a history of sigmoid volvulus in the past. Abdomen is nondistended. Had multiple BM overnight.  while on Lasix continue to Monitor closely. Has prior history of C diff also.   . Atrial fibrillation with controlled ventricular response  - Rate controlled with Coreg, suspect not a anticoagulation candidate given advanced age, fall risk   . Dementia with Parkinson's disease  - At baseline, at risk for delirium while inpatient.  - Continue Aricept, benzodiazepines, trazodone and Zoloft.  -carefull with  oversedation.   . Coronary artery disease  - Stable without any chest pain. Troponin negative.  - Continue antiplatelet agents, statins, beta blockers.   . Diabetes mellitus  - Diet controlled as outpatient.  A1c 5.8. SSI.    Code Status: DNR Family Communication: 2daughters  at bedside.  Disposition Plan: pending clinical course.    Consultants:  none  Procedures:  Doppler LE:   Antibiotics:  none  HPI/Subjective: States still with diarrhea x3 so far today. Denies SOB Objective: Filed Vitals:   01/01/14 1401  BP: 123/57  Pulse: 82  Temp: 98.3 F (36.8 C)  Resp: 20    Intake/Output Summary (Last 24 hours) at 01/01/14 1716 Last data filed at 01/01/14 1351  Gross per 24 hour  Intake   1080 ml  Output   1250 ml  Net   -170 ml   Filed Weights   12/29/13 0404 12/30/13 0545 01/01/14 0502  Weight: 70.2 kg (154 lb 12.2 oz) 66.8 kg (147 lb 4.3 oz) 65.9 kg (145 lb 4.5 oz)    Exam:   General:  Awake, alert, anxious.   Cardiovascular: S 1, S 2 RRR  Respiratory: decreased BS at bases R>L, mildly labor.   Abdomen: BS present, soft, mild distended, no tenderness.   Musculoskeletal:No edema, no cyanosis.   Data Reviewed: Basic Metabolic Panel:  Recent Labs Lab 12/27/13 2145 12/28/13 0414 12/29/13 1039 12/30/13 1714 12/31/13 0445  NA 141 141 146 140 143  K 4.8 4.7 4.1 2.8* 3.2*  CL 103 102 101 93* 97  CO2 27 30 32 39* 37*  GLUCOSE 163* 183* 177* 152* 122*  BUN  15 15 18 14 13   CREATININE 0.88 0.90 0.93 0.84 0.92  CALCIUM 9.7 9.6 9.7 9.1 9.0   Liver Function Tests:  Recent Labs Lab 12/27/13 2145  AST 15  ALT 7  ALKPHOS 82  BILITOT 0.3  PROT 7.6  ALBUMIN 3.7    Recent Labs Lab 12/27/13 2145  LIPASE 18   No results found for this basename: AMMONIA,  in the last 168 hours CBC:  Recent Labs Lab 12/27/13 2145 12/28/13 0414 12/30/13 0900 12/31/13 0445  WBC 7.3 7.0 4.2 4.1  HGB 9.1* 8.6* 9.2* 8.2*  HCT 30.3* 28.5* 29.8* 26.5*  MCV  111.8* 113.1* 109.6* 108.6*  PLT 172 137* 137* 130*   Cardiac Enzymes:  Recent Labs Lab 12/28/13 0412 12/28/13 0659 12/28/13 1250  TROPONINI <0.30 <0.30 <0.30   BNP (last 3 results)  Recent Labs  05/24/13 0250 12/27/13 2146 12/29/13 1039  PROBNP 3915.0* 7477.0* 11594.0*   CBG:  Recent Labs Lab 12/31/13 1644 12/31/13 2159 01/01/14 0743 01/01/14 1213 01/01/14 1650  GLUCAP 155* 132* 111* 137* 140*    Recent Results (from the past 240 hour(s))  MRSA PCR SCREENING     Status: None   Collection Time    12/28/13  1:30 AM      Result Value Ref Range Status   MRSA by PCR NEGATIVE  NEGATIVE Final   Comment:            The GeneXpert MRSA Assay (FDA     approved for NASAL specimens     only), is one component of a     comprehensive MRSA colonization     surveillance program. It is not     intended to diagnose MRSA     infection nor to guide or     monitor treatment for     MRSA infections.  CLOSTRIDIUM DIFFICILE BY PCR     Status: Abnormal   Collection Time    12/29/13 10:19 PM      Result Value Ref Range Status   C difficile by pcr POSITIVE (*) NEGATIVE Final   Comment: CRITICAL RESULT CALLED TO, READ BACK BY AND VERIFIED WITH:     G.SISON,RN 12/30/13 0825BY BSLADE     Performed at [facility] DIFFICILE BY PCR     Status: Abnormal   Collection Time    12/31/13  8:17 AM      Result Value Ref Range Status   C difficile by pcr POSITIVE (*) NEGATIVE Final   Comment: CRITICAL RESULT CALLED TO, READ BACK BY AND VERIFIED WITH:     Jolyn Lent RN 11:45 12/31/13 ([email or web address])     Performed at [facility]     Studies: Dg Chest 2 View  01/01/2014   CLINICAL DATA:  Shortness of breath, followup pulmonary edema, history atrial fibrillation, Parkinson's, hypertension, diabetes, hyperlipidemia, coronary artery disease  EXAM: CHEST  2 VIEW  COMPARISON:  12/29/2013  FINDINGS: Enlargement of cardiac silhouette with pulmonary vascular congestion.  Bibasilar  effusions and atelectasis.  Partial loculation of a pleural effusion posteriorly on the lateral view likely on RIGHT.  Perihilar edema, slightly improved.  No pneumothorax or acute osseous findings.  Bones appear demineralized.  IMPRESSION: Slightly improved pulmonary edema.  Bibasilar effusions and atelectasis, with RIGHT pleural effusion appearing partially loculated.   Electronically Signed   By: Lavonia Dana M.D.   On: 01/01/2014 08:34    Scheduled Meds: . acetaminophen  1,000 mg Oral TID  . antiseptic oral rinse  15 mL Mouth Rinse BID  . aspirin  81 mg Oral Daily  . carvedilol  3.125 mg Oral Daily  . cholecalciferol  1,000 Units Oral Daily  . clopidogrel  75 mg Oral Daily  . cyanocobalamin  500 mcg Oral Daily  . cyclobenzaprine  5 mg Oral QHS  . donepezil  10 mg Oral QHS  . dorzolamide  1 drop Both Eyes TID  . feeding supplement (GLUCERNA SHAKE)  237 mL Oral BID BM  . ferrous sulfate  325 mg Oral Q breakfast  . furosemide  40 mg Intravenous BID  . gabapentin  300 mg Oral BID  . heparin  5,000 Units Subcutaneous 3 times per day  . hydrALAZINE  10 mg Oral TID  . insulin aspart  0-9 Units Subcutaneous TID WC  . latanoprost  2 drop Both Eyes QHS  . loratadine  10 mg Oral Daily  . LORazepam  0.5 mg Oral BID  . vancomycin  500 mg Oral 4 times per day   And  . metronidazole  500 mg Intravenous 3 times per day  . senna  1 tablet Oral BID  . sertraline  50 mg Oral Daily  . simvastatin  10 mg Oral q1800  . sodium chloride  3 mL Intravenous Q12H  . sodium chloride  3 mL Intravenous Q12H  . traZODone  100 mg Oral QHS  . vitamin C  500 mg Oral Daily   Continuous Infusions:   Active Problems:   Atrial fibrillation with controlled ventricular response   Hypertension   Diabetes mellitus   Coronary artery disease   Dementia   Acute diastolic CHF (congestive heart failure)   Acute on chronic respiratory failure with hypoxemia    Time spent: 35 minutes.     [facility] Pager [phone]. If 7PM-7AM, please contact night-coverage at [email or web address], password [facility] 01/01/2014, 5:16 PM  LOS: 5 days

## 2014-01-02 LAB — CBC
HCT: 26.3 % — ABNORMAL LOW (ref 36.0–46.0)
HEMOGLOBIN: 8.4 g/dL — AB (ref 12.0–15.0)
MCH: 34.1 pg — AB (ref 26.0–34.0)
MCHC: 31.9 g/dL (ref 30.0–36.0)
MCV: 106.9 fL — AB (ref 78.0–100.0)
PLATELETS: 129 10*3/uL — AB (ref 150–400)
RBC: 2.46 MIL/uL — ABNORMAL LOW (ref 3.87–5.11)
RDW: 14.3 % (ref 11.5–15.5)
WBC: 3.8 10*3/uL — AB (ref 4.0–10.5)

## 2014-01-02 LAB — BASIC METABOLIC PANEL
Anion gap: 12 (ref 5–15)
BUN: 19 mg/dL (ref 6–23)
CO2: 31 meq/L (ref 19–32)
Calcium: 8.8 mg/dL (ref 8.4–10.5)
Chloride: 97 mEq/L (ref 96–112)
Creatinine, Ser: 1.14 mg/dL — ABNORMAL HIGH (ref 0.50–1.10)
GFR calc Af Amer: 49 mL/min — ABNORMAL LOW (ref >90)
GFR, EST NON AFRICAN AMERICAN: 42 mL/min — AB (ref >90)
GLUCOSE: 147 mg/dL — AB (ref 70–99)
POTASSIUM: 2.6 meq/L — AB (ref 3.7–5.3)
SODIUM: 140 meq/L (ref 137–147)

## 2014-01-02 LAB — GLUCOSE, CAPILLARY
GLUCOSE-CAPILLARY: 124 mg/dL — AB (ref 70–99)
GLUCOSE-CAPILLARY: 200 mg/dL — AB (ref 70–99)
Glucose-Capillary: 128 mg/dL — ABNORMAL HIGH (ref 70–99)
Glucose-Capillary: 110 mg/dL — ABNORMAL HIGH (ref 70–99)

## 2014-01-02 MED ORDER — POTASSIUM CHLORIDE 10 MEQ/100ML IV SOLN
10.0000 meq | INTRAVENOUS | Status: AC
  Administered 2014-01-02 (×4): 10 meq via INTRAVENOUS
  Filled 2014-01-02 (×5): qty 100

## 2014-01-02 MED ORDER — POTASSIUM CHLORIDE 10 MEQ/100ML IV SOLN
10.0000 meq | INTRAVENOUS | Status: DC
  Filled 2014-01-02 (×4): qty 100

## 2014-01-02 MED ORDER — POTASSIUM CHLORIDE CRYS ER 20 MEQ PO TBCR
40.0000 meq | EXTENDED_RELEASE_TABLET | Freq: Once | ORAL | Status: AC
  Administered 2014-01-02: 40 meq via ORAL
  Filled 2014-01-02: qty 2

## 2014-01-02 NOTE — Progress Notes (Signed)
[facility] PROGRESS NOTE  Maria Christian UQJ:[id number] DOB: 19-Jan-1925 DOA: 12/27/2013 PCP: Hollace Kinnier, DO  Assessment/Plan: . Acute on chronic respiratory failure with hypoxemia  - Patient apparently has chronic respiratory failure and is on home O2. acute hypoxic respiratory failure secondary to acute diastolic heart failure.  -Breathing better.  -Chest x ray with areas of infiltrate. Levaquin was dc'ed 7/29 due to positive C diff.  -repeat CXR today 7/30 Slightly improved pulmonary edema and R. Pleural effusion appearing partially loculated. -Followup CT discussed with radiologist and states moderate to large effusions are mostly dependent/free-flowing -continue with IV lasix 40 Mg IV BID for now, creatinine trending up follow and change to by mouth as appropriate  .?Partially loculated R. Pleural Effusion - CT to further as above radiologist states moderate to large effusions are mostly dependent/free-flowing -see management as above, follow  C diff;  Continue Flagyl and vancomycin. prior history of c diff.  -continue probiotic/florastor and follow  . Acute diastolic CHF (congestive heart failure)  - Chest x-ray does show bilateral pleural effusion and pulmonary edema, BNP is significantly elevated. No fever or leukocytosis to suggest pneumonia. -Continue with IV lasix 40 Mg BID.  -Continue to monitor strict I.'s and also and follow  Right LE edema: Bilateral DVT negative for DVT.   Marland Kitchen Hypertension  - Currently controlled,  Coreg, and hydralazine. Holder parameter for hydralazine.  -Holding Norvasc for now to avoid hypotension, as patient on IV lasix. BP stable   . Dilated bowels seen on x-rays/CT of the abdomen ; C diff positive. Started on vancomycin and flagyl.  - Has a history of sigmoid volvulus in the past. Abdomen is nondistended. Had multiple BM overnight.  while on Lasix continue to Monitor closely. Has prior history of C diff also.   . Atrial fibrillation  with controlled ventricular response  - Rate controlled with Coreg, suspect not a anticoagulation candidate given advanced age, fall risk   . Dementia with Parkinson's disease  - At baseline, at risk for delirium while inpatient.  - Continue Aricept, benzodiazepines, trazodone and Zoloft.  -carefull with oversedation.   . Coronary artery disease  - Stable without any chest pain. Troponin negative.  - Continue antiplatelet agents, statins, beta blockers.   . Diabetes mellitus  - Diet controlled as outpatient.  A1c 5.8. SSI.    Code Status: DNR Family Communication: 2daughters  at bedside.  Disposition Plan: pending clinical course.    Consultants:  none  Procedures:  Doppler LE:   Antibiotics:  none  HPI/Subjective: States still with diarrhea overnight but none so far today. Denies shortness of breath Objective: Filed Vitals:   01/02/14 1352  BP: 125/51  Pulse: 78  Temp: 96.7 F (35.9 C)  Resp: 18    Intake/Output Summary (Last 24 hours) at 01/02/14 1503 Last data filed at 01/02/14 1500  Gross per 24 hour  Intake   1180 ml  Output   1250 ml  Net    -70 ml   Filed Weights   12/30/13 0545 01/01/14 0502 01/02/14 0558  Weight: 66.8 kg (147 lb 4.3 oz) 65.9 kg (145 lb 4.5 oz) 62.4 kg (137 lb 9.1 oz)    Exam:   General:  Awake, alert, anxious.   Cardiovascular: S 1, S 2 RRR  Respiratory: decreased BS at bases R>L, mildly labor.   Abdomen: BS present, soft, mild distended, no tenderness.   Musculoskeletal:No edema, no cyanosis.   Data Reviewed: Basic Metabolic Panel:  Recent Labs Lab 12/28/13  1751 12/29/13 1039 12/30/13 1714 12/31/13 0445 01/02/14 0415  NA 141 146 140 143 140  K 4.7 4.1 2.8* 3.2* 2.6*  CL 102 101 93* 97 97  CO2 30 32 39* 37* 31  GLUCOSE 183* 177* 152* 122* 147*  BUN 15 18 14 13 19   CREATININE 0.90 0.93 0.84 0.92 1.14*  CALCIUM 9.6 9.7 9.1 9.0 8.8   Liver Function Tests:  Recent Labs Lab 12/27/13 2145  AST 15  ALT 7   ALKPHOS 82  BILITOT 0.3  PROT 7.6  ALBUMIN 3.7    Recent Labs Lab 12/27/13 2145  LIPASE 18   No results found for this basename: AMMONIA,  in the last 168 hours CBC:  Recent Labs Lab 12/27/13 2145 12/28/13 0414 12/30/13 0900 12/31/13 0445 01/02/14 0415  WBC 7.3 7.0 4.2 4.1 3.8*  HGB 9.1* 8.6* 9.2* 8.2* 8.4*  HCT 30.3* 28.5* 29.8* 26.5* 26.3*  MCV 111.8* 113.1* 109.6* 108.6* 106.9*  PLT 172 137* 137* 130* 129*   Cardiac Enzymes:  Recent Labs Lab 12/28/13 0412 12/28/13 0659 12/28/13 1250  TROPONINI <0.30 <0.30 <0.30   BNP (last 3 results)  Recent Labs  05/24/13 0250 12/27/13 2146 12/29/13 1039  PROBNP 3915.0* 7477.0* 11594.0*   CBG:  Recent Labs Lab 01/01/14 0743 01/01/14 1213 01/01/14 1650 01/01/14 2214 01/02/14 1244  GLUCAP 111* 137* 140* 121* 200*    Recent Results (from the past 240 hour(s))  MRSA PCR SCREENING     Status: None   Collection Time    12/28/13  1:30 AM      Result Value Ref Range Status   MRSA by PCR NEGATIVE  NEGATIVE Final   Comment:            The GeneXpert MRSA Assay (FDA     approved for NASAL specimens     only), is one component of a     comprehensive MRSA colonization     surveillance program. It is not     intended to diagnose MRSA     infection nor to guide or     monitor treatment for     MRSA infections.  CLOSTRIDIUM DIFFICILE BY PCR     Status: Abnormal   Collection Time    12/29/13 10:19 PM      Result Value Ref Range Status   C difficile by pcr POSITIVE (*) NEGATIVE Final   Comment: CRITICAL RESULT CALLED TO, READ BACK BY AND VERIFIED WITH:     G.SISON,RN 12/30/13 0825BY BSLADE     Performed at [place] DIFFICILE BY PCR     Status: Abnormal   Collection Time    12/31/13  8:17 AM      Result Value Ref Range Status   C difficile by pcr POSITIVE (*) NEGATIVE Final   Comment: CRITICAL RESULT CALLED TO, READ BACK BY AND VERIFIED WITH:     Jolyn Lent RN 11:45 12/31/13 ([email or web address])      Performed at [facility]     Studies: Dg Chest 2 View  01/01/2014   CLINICAL DATA:  Shortness of breath, followup pulmonary edema, history atrial fibrillation, Parkinson's, hypertension, diabetes, hyperlipidemia, coronary artery disease  EXAM: CHEST  2 VIEW  COMPARISON:  12/29/2013  FINDINGS: Enlargement of cardiac silhouette with pulmonary vascular congestion.  Bibasilar effusions and atelectasis.  Partial loculation of a pleural effusion posteriorly on the lateral view likely on RIGHT.  Perihilar edema, slightly improved.  No pneumothorax or acute osseous findings.  Bones  appear demineralized.  IMPRESSION: Slightly improved pulmonary edema.  Bibasilar effusions and atelectasis, with RIGHT pleural effusion appearing partially loculated.   Electronically Signed   By: Lavonia Dana M.D.   On: 01/01/2014 08:34   Ct Chest W Contrast  01/01/2014   CLINICAL DATA:  Abnormal chest x-ray.  Evaluate for cancer.  EXAM: CT CHEST WITH CONTRAST  TECHNIQUE: Multidetector CT imaging of the chest was performed during intravenous contrast administration.  CONTRAST:  27mL OMNIPAQUE IOHEXOL 300 MG/ML  SOLN  COMPARISON:  Chest 01/01/2014  FINDINGS: Cardiac enlargement. Small pericardial effusion. Calcification of Coronary arteries and aorta. Normal caliber thoracic aorta without aneurysm. Great vessel origins are patent. 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 No significant lymphadenopathy in the chest. Evaluation of lungs is limited due to respiratory motion but there is diffuse patchy mosaic pattern to the lungs consistent with edema. Moderate-sized bilateral pleural effusions. Atelectasis or consolidation in the lung bases. Surgical absence of the gallbladder. Horseshoe kidney. Vascular calcifications in the upper abdomen. Degenerative changes in the spine. No destructive bone lesions.  IMPRESSION: Cardiac enlargement with diffuse pulmonary  edema, bilateral pleural effusions, and basilar atelectasis/ consolidation. Coronary artery calcification   Electronically Signed   By: Lucienne Capers M.D.   On: 01/01/2014 21:33    Scheduled Meds: . acetaminophen  1,000 mg Oral TID  . antiseptic oral rinse  15 mL Mouth Rinse BID  . aspirin  81 mg Oral Daily  . carvedilol  3.125 mg Oral Daily  . cholecalciferol  1,000 Units Oral Daily  . clopidogrel  75 mg Oral Daily  . cyanocobalamin  500 mcg Oral Daily  . cyclobenzaprine  5 mg Oral QHS  . donepezil  10 mg Oral QHS  . dorzolamide  1 drop Both Eyes TID  . feeding supplement (GLUCERNA SHAKE)  237 mL Oral BID BM  . ferrous sulfate  325 mg Oral Q breakfast  . furosemide  40 mg Intravenous BID  . gabapentin  300 mg Oral BID  . heparin  5,000 Units Subcutaneous 3 times per day  . hydrALAZINE  10 mg Oral TID  . insulin aspart  0-9 Units Subcutaneous TID WC  . latanoprost  2 drop Both Eyes QHS  . loratadine  10 mg Oral Daily  . LORazepam  0.5 mg Oral BID  . vancomycin  500 mg Oral 4 times per day   And  . metronidazole  500 mg Intravenous 3 times per day  . saccharomyces boulardii  250 mg Oral BID  . senna  1 tablet Oral BID  . sertraline  50 mg Oral Daily  . simvastatin  10 mg Oral q1800  . sodium chloride  3 mL Intravenous Q12H  . sodium chloride  3 mL Intravenous Q12H  . traZODone  100 mg Oral QHS  . vitamin C  500 mg Oral Daily   Continuous Infusions:   Active Problems:   Atrial fibrillation with controlled ventricular response   Hypertension   Diabetes mellitus   Coronary artery disease   Dementia   Acute diastolic CHF (congestive heart failure)   Acute on chronic respiratory failure with hypoxemia    Time spent: 25 minutes.     [place] [facility] Pager [phone]. If 7PM-7AM, please contact night-coverage at [email or web address], password [facility] 01/02/2014, 3:03 PM  LOS: 6 days

## 2014-01-02 NOTE — Progress Notes (Signed)
Assumed care of pt. No changes in initial am assessment. Cont with plan of care 

## 2014-01-02 NOTE — Progress Notes (Signed)
CSW continues to follow for discharge planning - back to [facility] when ready. Anticipating possible discharge over the weekend. Tammy @ SNF aware. Weekend CSW, Estill Bamberg (ph#: [phone]) to facilitate discharge over weekend if stable.   Raynaldo Opitz, [facility]  Clinical Social Worker  cell #: [phone]

## 2014-01-02 NOTE — Progress Notes (Signed)
CRITICAL VALUE ALERT  Critical value received:  Potassium 2.6  Date of notification:  01/02/2014  Time of notification:  0510  Critical value read back:Yes.    Nurse who received alert:  Daleen Bo RN  MD notified (1st page):  Lamar Blinks NP  Time of first page:  [phone]  MD notified (2nd page):  Time of second page:  Responding MD:  Lamar Blinks NP  Time MD responded:  [phone]

## 2014-01-03 LAB — BASIC METABOLIC PANEL
Anion gap: 9 (ref 5–15)
BUN: 18 mg/dL (ref 6–23)
CALCIUM: 9.1 mg/dL (ref 8.4–10.5)
CO2: 33 mEq/L — ABNORMAL HIGH (ref 19–32)
Chloride: 100 mEq/L (ref 96–112)
Creatinine, Ser: 1.14 mg/dL — ABNORMAL HIGH (ref 0.50–1.10)
GFR calc Af Amer: 49 mL/min — ABNORMAL LOW (ref >90)
GFR, EST NON AFRICAN AMERICAN: 42 mL/min — AB (ref >90)
Glucose, Bld: 137 mg/dL — ABNORMAL HIGH (ref 70–99)
POTASSIUM: 2.9 meq/L — AB (ref 3.7–5.3)
SODIUM: 142 meq/L (ref 137–147)

## 2014-01-03 LAB — GLUCOSE, CAPILLARY
GLUCOSE-CAPILLARY: 142 mg/dL — AB (ref 70–99)
Glucose-Capillary: 146 mg/dL — ABNORMAL HIGH (ref 70–99)
Glucose-Capillary: 179 mg/dL — ABNORMAL HIGH (ref 70–99)
Glucose-Capillary: 98 mg/dL (ref 70–99)

## 2014-01-03 LAB — MAGNESIUM: Magnesium: 1.9 mg/dL (ref 1.5–2.5)

## 2014-01-03 MED ORDER — POTASSIUM CHLORIDE CRYS ER 20 MEQ PO TBCR
40.0000 meq | EXTENDED_RELEASE_TABLET | Freq: Three times a day (TID) | ORAL | Status: AC
  Administered 2014-01-03 – 2014-01-04 (×5): 40 meq via ORAL
  Filled 2014-01-03 (×5): qty 2

## 2014-01-03 MED ORDER — POTASSIUM CHLORIDE 10 MEQ/100ML IV SOLN
10.0000 meq | INTRAVENOUS | Status: AC
  Administered 2014-01-03 (×3): 10 meq via INTRAVENOUS
  Filled 2014-01-03 (×3): qty 100

## 2014-01-03 MED ORDER — PANTOPRAZOLE SODIUM 40 MG PO TBEC
40.0000 mg | DELAYED_RELEASE_TABLET | Freq: Every day | ORAL | Status: DC
  Administered 2014-01-04 – 2014-01-05 (×2): 40 mg via ORAL
  Filled 2014-01-03 (×2): qty 1

## 2014-01-03 NOTE — Progress Notes (Signed)
[facility] PROGRESS NOTE  KAMERIN AXFORD GXQ:[id number] DOB: 1925/03/22 DOA: 12/27/2013 PCP: Hollace Kinnier, DO  Assessment/Plan: Active Problems:   Atrial fibrillation with controlled ventricular response   Hypertension   Diabetes mellitus   Coronary artery disease   Dementia   Acute diastolic CHF (congestive heart failure)   Acute on chronic respiratory failure with hypoxemia    Assessment/Plan:  . Acute on chronic respiratory failure with hypoxemia  - Patient apparently has chronic respiratory failure and is on home O2. acute hypoxic respiratory failure secondary to acute diastolic heart failure.  -Breathing better.  -Chest x ray with areas of infiltrate. Levaquin was dc'ed 7/29 due to positive C diff.  -repeat CXR today 7/30 Slightly improved pulmonary edema and R. Pleural effusion appearing partially loculated.  -Followup CT discussed with radiologist and states moderate to large effusions are mostly dependent/free-flowing  -continue with IV lasix 40 Mg IV BID for now, creatinine trending up follow and change to by mouth as appropriate  Replete potassium with the Lasix  .?Partially loculated R. Pleural Effusion  - CT to further as above radiologist states moderate to large effusions are mostly dependent/free-flowing  -see management as above, follow   C diff;  On IV Flagyl and by mouth vancomycin. prior history of c diff.  -continue probiotic/florastor and follow  Diarrhea improving therefore we'll discontinue IV Flagyl today  . Acute diastolic CHF (congestive heart failure)  - Chest x-ray does show bilateral pleural effusion and pulmonary edema, BNP is significantly elevated. No fever or leukocytosis to suggest pneumonia.  -Continue with IV lasix 40 Mg BID.  -Continue to monitor strict I.'s and also and follow  Follow BMP  Right LE edema: Bilateral DVT negative for DVT.   Marland Kitchen Hypertension  - Currently controlled, Coreg, and hydralazine. Holder parameter for  hydralazine.  -Holding Norvasc for now to avoid hypotension, as patient on IV lasix. BP stable   . Dilated bowels seen on x-rays/CT of the abdomen ; C diff positive. Started on vancomycin and flagyl.  - Has a history of sigmoid volvulus in the past. Abdomen is nondistended. Had multiple BM overnight. while on Lasix continue to Monitor closely. Has prior history of C diff also.   . Atrial fibrillation with controlled ventricular response  - Rate controlled with Coreg, suspect not a anticoagulation candidate given advanced age, fall risk   . Dementia with Parkinson's disease  - At baseline, at risk for delirium while inpatient.  - Continue Aricept, benzodiazepines, trazodone and Zoloft.  -carefull with oversedation.   . Coronary artery disease  - Stable without any chest pain. Troponin negative.  - Continue antiplatelet agents, statins, beta blockers.   . Diabetes mellitus  - Diet controlled as outpatient. A1c 5.8. SSI.   Code Status: DNR  Family Communication: 2daughters at bedside.  Disposition Plan: pending clinical course.  Consultants:  none Procedures:  Doppler LE:  Antibiotics:  none   HPI/Subjective: No complaints except burning through the IV, getting IV potassium because of low potassium this morning  Objective: Filed Vitals:   01/02/14 0558 01/02/14 1352 01/02/14 2021 01/03/14 0526  BP: 108/48 125/51 136/64 126/48  Pulse: 88 78 88 72  Temp: 98.1 F (36.7 C) 96.7 F (35.9 C) 99 F (37.2 C) 98.2 F (36.8 C)  TempSrc: Oral Oral Oral Oral  Resp: 16 18 18 16   Height:      Weight: 62.4 kg (137 lb 9.1 oz)   61.9 kg (136 lb 7.4 oz)  SpO2: 97% 96%  98% 100%    Intake/Output Summary (Last 24 hours) at 01/03/14 1155 Last data filed at 01/03/14 0915  Gross per 24 hour  Intake   1220 ml  Output   1900 ml  Net   -680 ml    Exam:  General: alert & oriented x 3 In NAD  Cardiovascular: RRR, nl S1 s2  Respiratory: Decreased breath sounds at the bases, scattered  rhonchi, no crackles  Abdomen: soft +BS NT/ND, no masses palpable  Extremities: No cyanosis and no edema      Data Reviewed: Basic Metabolic Panel:  Recent Labs Lab 12/29/13 1039 12/30/13 1714 12/31/13 0445 01/02/14 0415 01/03/14 0421  NA 146 140 143 140 142  K 4.1 2.8* 3.2* 2.6* 2.9*  CL 101 93* 97 97 100  CO2 32 39* 37* 31 33*  GLUCOSE 177* 152* 122* 147* 137*  BUN 18 14 13 19 18   CREATININE 0.93 0.84 0.92 1.14* 1.14*  CALCIUM 9.7 9.1 9.0 8.8 9.1  MG  --   --   --   --  1.9    Liver Function Tests:  Recent Labs Lab 12/27/13 2145  AST 15  ALT 7  ALKPHOS 82  BILITOT 0.3  PROT 7.6  ALBUMIN 3.7    Recent Labs Lab 12/27/13 2145  LIPASE 18   No results found for this basename: AMMONIA,  in the last 168 hours  CBC:  Recent Labs Lab 12/27/13 2145 12/28/13 0414 12/30/13 0900 12/31/13 0445 01/02/14 0415  WBC 7.3 7.0 4.2 4.1 3.8*  HGB 9.1* 8.6* 9.2* 8.2* 8.4*  HCT 30.3* 28.5* 29.8* 26.5* 26.3*  MCV 111.8* 113.1* 109.6* 108.6* 106.9*  PLT 172 137* 137* 130* 129*    Cardiac Enzymes:  Recent Labs Lab 12/28/13 0412 12/28/13 0659 12/28/13 1250  TROPONINI <0.30 <0.30 <0.30   BNP (last 3 results)  Recent Labs  05/24/13 0250 12/27/13 2146 12/29/13 1039  PROBNP 3915.0* 7477.0* 11594.0*     CBG:  Recent Labs Lab 01/02/14 0744 01/02/14 1244 01/02/14 1745 01/02/14 2212 01/03/14 0744  GLUCAP 124* 200* 128* 110* 146*    Recent Results (from the past 240 hour(s))  MRSA PCR SCREENING     Status: None   Collection Time    12/28/13  1:30 AM      Result Value Ref Range Status   MRSA by PCR NEGATIVE  NEGATIVE Final   Comment:            The GeneXpert MRSA Assay (FDA     approved for NASAL specimens     only), is one component of a     comprehensive MRSA colonization     surveillance program. It is not     intended to diagnose MRSA     infection nor to guide or     monitor treatment for     MRSA infections.  CLOSTRIDIUM DIFFICILE BY  PCR     Status: Abnormal   Collection Time    12/29/13 10:19 PM      Result Value Ref Range Status   C difficile by pcr POSITIVE (*) NEGATIVE Final   Comment: CRITICAL RESULT CALLED TO, READ BACK BY AND VERIFIED WITH:     G.SISON,RN 12/30/13 0825BY BSLADE     Performed at [facility] DIFFICILE BY PCR     Status: Abnormal   Collection Time    12/31/13  8:17 AM      Result Value Ref Range Status   C difficile by  pcr POSITIVE (*) NEGATIVE Final   Comment: CRITICAL RESULT CALLED TO, READ BACK BY AND VERIFIED WITH:     Jolyn Lent RN 11:45 12/31/13 ([email or web address])     Performed at [facility]     Studies: Dg Chest 2 View  01/01/2014   CLINICAL DATA:  Shortness of breath, followup pulmonary edema, history atrial fibrillation, Parkinson's, hypertension, diabetes, hyperlipidemia, coronary artery disease  EXAM: CHEST  2 VIEW  COMPARISON:  12/29/2013  FINDINGS: Enlargement of cardiac silhouette with pulmonary vascular congestion.  Bibasilar effusions and atelectasis.  Partial loculation of a pleural effusion posteriorly on the lateral view likely on RIGHT.  Perihilar edema, slightly improved.  No pneumothorax or acute osseous findings.  Bones appear demineralized.  IMPRESSION: Slightly improved pulmonary edema.  Bibasilar effusions and atelectasis, with RIGHT pleural effusion appearing partially loculated.   Electronically Signed   By: Lavonia Dana M.D.   On: 01/01/2014 08:34   Ct Chest W Contrast  01/01/2014   CLINICAL DATA:  Abnormal chest x-ray.  Evaluate for cancer.  EXAM: CT CHEST WITH CONTRAST  TECHNIQUE: Multidetector CT imaging of the chest was performed during intravenous contrast administration.  CONTRAST:  85mL OMNIPAQUE IOHEXOL 300 MG/ML  SOLN  COMPARISON:  Chest 01/01/2014  FINDINGS: Cardiac enlargement. Small pericardial effusion. Calcification of Coronary arteries and aorta. Normal caliber thoracic aorta without aneurysm. Great vessel origins are patent. 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 No significant lymphadenopathy in the chest. Evaluation of lungs is limited due to respiratory motion but there is diffuse patchy mosaic pattern to the lungs consistent with edema. Moderate-sized bilateral pleural effusions. Atelectasis or consolidation in the lung bases. Surgical absence of the gallbladder. Horseshoe kidney. Vascular calcifications in the upper abdomen. Degenerative changes in the spine. No destructive bone lesions.  IMPRESSION: Cardiac enlargement with diffuse pulmonary edema, bilateral pleural effusions, and basilar atelectasis/ consolidation. Coronary artery calcification   Electronically Signed   By: Lucienne Capers M.D.   On: 01/01/2014 21:33   Ct Abdomen Pelvis W Contrast  12/27/2013   CLINICAL DATA:  Constipation in weakness, shortness of breath, abdominal distention  EXAM: CT ABDOMEN AND PELVIS WITH CONTRAST  TECHNIQUE: Multidetector CT imaging of the abdomen and pelvis was performed using the standard protocol following bolus administration of intravenous contrast.  CONTRAST:  173mL OMNIPAQUE IOHEXOL 300 MG/ML  SOLN  COMPARISON:  12/27/2013 plain films, 10/09/2013 CT scan  FINDINGS: There are moderate to large bilateral pleural effusions with underlying compressive atelectasis. There is cardiomegaly.  There is fatty infiltration of the liver. Gallbladder is surgically absent. Spleen is normal. Adrenal glands are normal. There is a horseshoe kidney with out significant abnormalities.  Pancreas is normal. Abdominal aorta is significantly calcified. Stomach and small bowel appear normal. The large bowel is normal except for decompression of the distal most sigmoid and rectum likely due to peristalsis. There is beam attenuation artifact limiting evaluation of the pelvis related to the left hip replacement. There is mild fecal retention throughout the colon.  Allowing for this, bladder  appears normal. There is a small volume of ascites in the pelvis. There are no acute musculoskeletal findings.  IMPRESSION: Similar to prior study, findings are most consistent with colonic ileus. There is mild fecal retention.  Moderate to large pleural effusions, larger on the right, with underlying compressive atelectasis.  Small volume ascites in the pelvis.   Electronically Signed   By: Skipper Cliche  M.D.   On: 12/27/2013 23:49   Dg Chest Port 1 View  12/29/2013   CLINICAL DATA:  Difficulty breathing  EXAM: PORTABLE CHEST - 1 VIEW  COMPARISON:  December 27, 2013  FINDINGS: There is cardiomegaly with bilateral effusions. There is patchy interstitial and alveolar edema bilaterally. No adenopathy. There is atherosclerotic change throughout the aorta. There is acromioclavicular separation on the left. There is evidence of an old right clavicle fracture.  IMPRESSION: Evidence of congestive heart failure. Areas of airspace opacity potentially could represent superimposed pneumonia. Chronic appearing acromioclavicular separation on the left.   Electronically Signed   By: Lowella Grip M.D.   On: 12/29/2013 10:43   Dg Chest Port 1 View  12/27/2013   CLINICAL DATA:  Shortness of breath with cough and weakness.  EXAM: PORTABLE CHEST - 1 VIEW  COMPARISON:  10/13/2013.  FINDINGS: 2209 hrs. Vascular congestion with associated pulmonary edema. There is bilateral pleural effusions, left greater than right. Cardiopericardial silhouette is markedly enlarged. Bones are diffusely demineralized. Telemetry leads overlie the chest.  IMPRESSION: Cardiomegaly with pulmonary edema and bilateral pleural effusions.   Electronically Signed   By: Misty Stanley M.D.   On: 12/27/2013 22:57   Dg Abd Portable 1v  12/27/2013   CLINICAL DATA:  Abdominal discomfort, weakness and shortness of breath.  EXAM: PORTABLE ABDOMEN - 1 VIEW  COMPARISON:  Abdominal radiograph performed 10/14/2013  FINDINGS: There is slight interval improvement  in colonic distention, with apparent stool noted throughout the colon. Scattered air-filled loops of small bowel are also seen. This may reflect residual ileus, improved from the prior study. No free intra-abdominal air is identified, though evaluation for free air is limited on a single supine view. Scattered clips are noted in the right upper quadrant. The stomach is partially filled with air.  Mild degenerative change is noted along the lumbar spine; the sacroiliac joints are unremarkable in appearance. The left hip arthroplasty is incompletely imaged but appears grossly unremarkable.  IMPRESSION: Slight interval improvement in colonic distention, with apparent stool noted throughout the colon. This may reflect residual ileus, improved from the prior study. No free intra-abdominal air seen.   Electronically Signed   By: Garald Balding M.D.   On: 12/27/2013 22:57    Scheduled Meds: . acetaminophen  1,000 mg Oral TID  . antiseptic oral rinse  15 mL Mouth Rinse BID  . aspirin  81 mg Oral Daily  . carvedilol  3.125 mg Oral Daily  . cholecalciferol  1,000 Units Oral Daily  . clopidogrel  75 mg Oral Daily  . cyanocobalamin  500 mcg Oral Daily  . cyclobenzaprine  5 mg Oral QHS  . donepezil  10 mg Oral QHS  . dorzolamide  1 drop Both Eyes TID  . feeding supplement (GLUCERNA SHAKE)  237 mL Oral BID BM  . ferrous sulfate  325 mg Oral Q breakfast  . furosemide  40 mg Intravenous BID  . gabapentin  300 mg Oral BID  . heparin  5,000 Units Subcutaneous 3 times per day  . hydrALAZINE  10 mg Oral TID  . insulin aspart  0-9 Units Subcutaneous TID WC  . latanoprost  2 drop Both Eyes QHS  . loratadine  10 mg Oral Daily  . LORazepam  0.5 mg Oral BID  . vancomycin  500 mg Oral 4 times per day   And  . metronidazole  500 mg Intravenous 3 times per day  . pantoprazole  40 mg Oral Daily  .  potassium chloride  40 mEq Oral TID  . saccharomyces boulardii  250 mg Oral BID  . senna  1 tablet Oral BID  .  sertraline  50 mg Oral Daily  . simvastatin  10 mg Oral q1800  . sodium chloride  3 mL Intravenous Q12H  . sodium chloride  3 mL Intravenous Q12H  . traZODone  100 mg Oral QHS  . vitamin C  500 mg Oral Daily   Continuous Infusions:   Active Problems:   Atrial fibrillation with controlled ventricular response   Hypertension   Diabetes mellitus   Coronary artery disease   Dementia   Acute diastolic CHF (congestive heart failure)   Acute on chronic respiratory failure with hypoxemia    Time spent: 40 minutes   [facility] Pager [phone]. If 8PM-8AM, please contact night-coverage at [email or web address], password [facility] 01/03/2014, 11:55 AM  LOS: 7 days

## 2014-01-03 NOTE — Progress Notes (Signed)
CRITICAL VALUE ALERT  Critical value received:  Potassium  2.9  Date of notification:  01/03/2014  Time of notification:  0510  Critical value read back:Yes.    Nurse who received alert:  Daleen Bo RN  MD notified (1st page):  Kathline Magic NP  Time of first page:  [phone]  MD notified (2nd page):  Time of second page:  Responding MD:  Kathline Magic NP  Time MD responded:  [phone]

## 2014-01-03 NOTE — Progress Notes (Signed)
Per MD, Pt not ready for d/c today.  Facility aware.  CSW to continue to follow.  Amanda Neave Lenger, LCSW Clinical Social Work [phone]  

## 2014-01-03 NOTE — Progress Notes (Signed)
Patient's foley catheter has been leaking since yesterday, unable to fix the problem. Kathline Magic was made aware, foley catheter replaced as ordered.

## 2014-01-04 LAB — COMPREHENSIVE METABOLIC PANEL
ALK PHOS: 69 U/L (ref 39–117)
ALT: 16 U/L (ref 0–35)
AST: 53 U/L — ABNORMAL HIGH (ref 0–37)
Albumin: 3.2 g/dL — ABNORMAL LOW (ref 3.5–5.2)
Anion gap: 11 (ref 5–15)
BUN: 18 mg/dL (ref 6–23)
CO2: 28 meq/L (ref 19–32)
Calcium: 9.1 mg/dL (ref 8.4–10.5)
Chloride: 103 mEq/L (ref 96–112)
Creatinine, Ser: 1.18 mg/dL — ABNORMAL HIGH (ref 0.50–1.10)
GFR, EST AFRICAN AMERICAN: 47 mL/min — AB (ref >90)
GFR, EST NON AFRICAN AMERICAN: 40 mL/min — AB (ref >90)
GLUCOSE: 116 mg/dL — AB (ref 70–99)
POTASSIUM: 4.3 meq/L (ref 3.7–5.3)
SODIUM: 142 meq/L (ref 137–147)
Total Bilirubin: 0.3 mg/dL (ref 0.3–1.2)
Total Protein: 6.5 g/dL (ref 6.0–8.3)

## 2014-01-04 LAB — GLUCOSE, CAPILLARY
GLUCOSE-CAPILLARY: 138 mg/dL — AB (ref 70–99)
Glucose-Capillary: 123 mg/dL — ABNORMAL HIGH (ref 70–99)
Glucose-Capillary: 142 mg/dL — ABNORMAL HIGH (ref 70–99)
Glucose-Capillary: 109 mg/dL — ABNORMAL HIGH (ref 70–99)

## 2014-01-04 LAB — CBC
HCT: 27.3 % — ABNORMAL LOW (ref 36.0–46.0)
HEMOGLOBIN: 8.8 g/dL — AB (ref 12.0–15.0)
MCH: 34.4 pg — AB (ref 26.0–34.0)
MCHC: 32.2 g/dL (ref 30.0–36.0)
MCV: 106.6 fL — ABNORMAL HIGH (ref 78.0–100.0)
PLATELETS: 130 10*3/uL — AB (ref 150–400)
RBC: 2.56 MIL/uL — ABNORMAL LOW (ref 3.87–5.11)
RDW: 14.3 % (ref 11.5–15.5)
WBC: 4.2 10*3/uL (ref 4.0–10.5)

## 2014-01-04 MED ORDER — FUROSEMIDE 10 MG/ML IJ SOLN
40.0000 mg | Freq: Three times a day (TID) | INTRAMUSCULAR | Status: DC
  Administered 2014-01-04 – 2014-01-05 (×2): 40 mg via INTRAVENOUS
  Filled 2014-01-04 (×4): qty 4

## 2014-01-04 NOTE — Progress Notes (Signed)
Per MD, Pt not ready for d/c today.  Facility aware.  CSW to continue to follow.  Amanda Atalie Oros, LCSW Clinical Social Work [phone]  

## 2014-01-04 NOTE — Progress Notes (Signed)
[facility] PROGRESS NOTE  Maria Christian ION:[id number] DOB: [birth year] DOA: 12/27/2013 PCP: Hollace Kinnier, DO  Assessment/Plan: Active Problems:   Atrial fibrillation with controlled ventricular response   Hypertension   Diabetes mellitus   Coronary artery disease   Dementia   Acute diastolic CHF (congestive heart failure)   Acute on chronic respiratory failure with hypoxemia    Assessment/Plan:  . Acute on chronic respiratory failure with hypoxemia  - Patient apparently has chronic respiratory failure and is on home O2. acute hypoxic respiratory failure secondary to acute on chronic diastolic heart failure.  -Breathing better.  -Chest x ray with areas of infiltrate. Levaquin was dc'ed 7/29 due to positive C diff and lack of clinical correlation for PNA  -repeat CXR today 7/30 Slightly improved pulmonary edema and R. Pleural effusion appearing partially loculated.  -continue lasix IV -follow BMET and replete electrolytes  .?Partially loculated R. Pleural Effusion  - CT demonstrating free flowing fluid -will continue lasix therapy for interstitial edema   C diff;  -continue oral vancomycin -continue probiotic/florastor and follow    . Acute diastolic CHF (congestive heart failure)  - Chest x-ray does show bilateral pleural effusion and pulmonary edema, BNP is significantly elevated. No fever or leukocytosis to suggest pneumonia.  -Continue with IV lasix 40 Mg TID and follow CXR in am.  -Continue to monitor strict I.'s and also and follow heart healthy diet  -Follow BMP   Right LE edema: Bilateral Duplex negative for DVT.   Marland Kitchen Hypertension  - Currently controlled, Coreg, and hydralazine. Holder parameter for hydralazine.  -Holding Norvasc for now to avoid hypotension, as patient on IV lasix. BP stable   . Dilated bowels seen on x-rays/CT of the abdomen ; C diff positive. Started on vancomycin and flagyl.  - Has a history of sigmoid volvulus in the past. Abdomen is  nondistended. Had multiple BM overnight. while on Lasix continue to Monitor closely. Has prior history of C diff also.  -will continue tx with oral vanc only and florastor  . Atrial fibrillation with controlled ventricular response  - Rate controlled with Coreg, suspect not a anticoagulation candidate given advanced age and high fall risk   . Dementia with Parkinson's disease  - At baseline, at risk for delirium while inpatient.  - Continue Aricept, benzodiazepines, trazodone and Zoloft.   . Coronary artery disease  - Stable without any chest pain. Troponin negative.  - Continue antiplatelet agents, statins, beta blockers.   . Diabetes mellitus  - Diet controlled as outpatient. A1c 5.8.  -continue SSI.   Code Status: DNR  Family Communication: no family at bedside Disposition Plan: SNF at the moment of discharge; improving breathing and lung congestion.   Consultants:  none Procedures:  Doppler LE: no DVT  Antibiotics:  none   HPI/Subjective: Overall feeling better; still with some SOB.  Objective: Filed Vitals:   01/03/14 1341 01/03/14 2103 01/04/14 0444 01/04/14 1500  BP: 96/49 112/62 107/57 120/60  Pulse: 72 83 90 88  Temp: 98 F (36.7 C) 99.2 F (37.3 C) 98.8 F (37.1 C) 98.5 F (36.9 C)  TempSrc: Oral Oral Oral Oral  Resp: 18 18 18 18   Height:      Weight:   63.1 kg (139 lb 1.8 oz)   SpO2: 99% 99% 98% 99%    Intake/Output Summary (Last 24 hours) at 01/04/14 1821 Last data filed at 01/04/14 1634  Gross per 24 hour  Intake    790 ml  Output  1300 ml  Net   -510 ml    Exam:  General: alert & oriented x 3 In NAD  Cardiovascular: RRR, nl S1 s2  Respiratory: Decreased breath sounds at the bases (R > L), scattered rhonchi, no frank crackles  Abdomen: soft +BS NT/ND, no masses palpable  Extremities: No cyanosis and no edema    Data Reviewed: Basic Metabolic Panel:  Recent Labs Lab 12/30/13 1714 12/31/13 0445 01/02/14 0415 01/03/14 0421  01/04/14 0519  NA 140 143 140 142 142  K 2.8* 3.2* 2.6* 2.9* 4.3  CL 93* 97 97 100 103  CO2 39* 37* 31 33* 28  GLUCOSE 152* 122* 147* 137* 116*  BUN 14 13 19 18 18   CREATININE 0.84 0.92 1.14* 1.14* 1.18*  CALCIUM 9.1 9.0 8.8 9.1 9.1  MG  --   --   --  1.9  --     Liver Function Tests:  Recent Labs Lab 01/04/14 0519  AST 53*  ALT 16  ALKPHOS 69  BILITOT 0.3  PROT 6.5  ALBUMIN 3.2*    CBC:  Recent Labs Lab 12/30/13 0900 12/31/13 0445 01/02/14 0415 01/04/14 0519  WBC 4.2 4.1 3.8* 4.2  HGB 9.2* 8.2* 8.4* 8.8*  HCT 29.8* 26.5* 26.3* 27.3*  MCV 109.6* 108.6* 106.9* 106.6*  PLT 137* 130* 129* 130*   BNP (last 3 results)  Recent Labs  05/24/13 0250 12/27/13 2146 12/29/13 1039  PROBNP 3915.0* 7477.0* 11594.0*    CBG:  Recent Labs Lab 01/03/14 1701 01/03/14 2150 01/04/14 0751 01/04/14 1150 01/04/14 1711  GLUCAP 142* 98 123* 142* 109*    Recent Results (from the past 240 hour(s))  MRSA PCR SCREENING     Status: None   Collection Time    12/28/13  1:30 AM      Result Value Ref Range Status   MRSA by PCR NEGATIVE  NEGATIVE Final   Comment:            The GeneXpert MRSA Assay (FDA     approved for NASAL specimens     only), is one component of a     comprehensive MRSA colonization     surveillance program. It is not     intended to diagnose MRSA     infection nor to guide or     monitor treatment for     MRSA infections.  CLOSTRIDIUM DIFFICILE BY PCR     Status: Abnormal   Collection Time    12/29/13 10:19 PM      Result Value Ref Range Status   C difficile by pcr POSITIVE (*) NEGATIVE Final   Comment: CRITICAL RESULT CALLED TO, READ BACK BY AND VERIFIED WITH:     G.SISON,RN 12/30/13 [id number] BSLADE     Performed at [facility] DIFFICILE BY PCR     Status: Abnormal   Collection Time    12/31/13  8:17 AM      Result Value Ref Range Status   C difficile by pcr POSITIVE (*) NEGATIVE Final   Comment: CRITICAL RESULT CALLED TO,  READ BACK BY AND VERIFIED WITH:     Jolyn Lent RN 11:45 12/31/13 ([email or web address])     Performed at [facility]     Studies: Dg Chest 2 View  01/01/2014   CLINICAL DATA:  Shortness of breath, followup pulmonary edema, history atrial fibrillation, Parkinson's, hypertension, diabetes, hyperlipidemia, coronary artery disease  EXAM: CHEST  2 VIEW  COMPARISON:  12/29/2013  FINDINGS: Enlargement of cardiac  silhouette with pulmonary vascular congestion.  Bibasilar effusions and atelectasis.  Partial loculation of a pleural effusion posteriorly on the lateral view likely on RIGHT.  Perihilar edema, slightly improved.  No pneumothorax or acute osseous findings.  Bones appear demineralized.  IMPRESSION: Slightly improved pulmonary edema.  Bibasilar effusions and atelectasis, with RIGHT pleural effusion appearing partially loculated.   Electronically Signed   By: Lavonia Dana M.D.   On: 01/01/2014 08:34   Ct Chest W Contrast  01/01/2014   CLINICAL DATA:  Abnormal chest x-ray.  Evaluate for cancer.  EXAM: CT CHEST WITH CONTRAST  TECHNIQUE: Multidetector CT imaging of the chest was performed during intravenous contrast administration.  CONTRAST:  66mL OMNIPAQUE IOHEXOL 300 MG/ML  SOLN  COMPARISON:  Chest 01/01/2014  FINDINGS: Cardiac enlargement. Small pericardial effusion. Calcification of Coronary arteries and aorta. Normal caliber thoracic aorta without aneurysm. Great vessel origins are patent. 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 No significant lymphadenopathy in the chest. Evaluation of lungs is limited due to respiratory motion but there is diffuse patchy mosaic pattern to the lungs consistent with edema. Moderate-sized bilateral pleural effusions. Atelectasis or consolidation in the lung bases. Surgical absence of the gallbladder. Horseshoe kidney. Vascular calcifications in the upper abdomen. Degenerative changes in the spine. No  destructive bone lesions.  IMPRESSION: Cardiac enlargement with diffuse pulmonary edema, bilateral pleural effusions, and basilar atelectasis/ consolidation. Coronary artery calcification   Electronically Signed   By: Lucienne Capers M.D.   On: 01/01/2014 21:33   Ct Abdomen Pelvis W Contrast  12/27/2013   CLINICAL DATA:  Constipation in weakness, shortness of breath, abdominal distention  EXAM: CT ABDOMEN AND PELVIS WITH CONTRAST  TECHNIQUE: Multidetector CT imaging of the abdomen and pelvis was performed using the standard protocol following bolus administration of intravenous contrast.  CONTRAST:  117mL OMNIPAQUE IOHEXOL 300 MG/ML  SOLN  COMPARISON:  12/27/2013 plain films, 10/09/2013 CT scan  FINDINGS: There are moderate to large bilateral pleural effusions with underlying compressive atelectasis. There is cardiomegaly.  There is fatty infiltration of the liver. Gallbladder is surgically absent. Spleen is normal. Adrenal glands are normal. There is a horseshoe kidney with out significant abnormalities.  Pancreas is normal. Abdominal aorta is significantly calcified. Stomach and small bowel appear normal. The large bowel is normal except for decompression of the distal most sigmoid and rectum likely due to peristalsis. There is beam attenuation artifact limiting evaluation of the pelvis related to the left hip replacement. There is mild fecal retention throughout the colon.  Allowing for this, bladder appears normal. There is a small volume of ascites in the pelvis. There are no acute musculoskeletal findings.  IMPRESSION: Similar to prior study, findings are most consistent with colonic ileus. There is mild fecal retention.  Moderate to large pleural effusions, larger on the right, with underlying compressive atelectasis.  Small volume ascites in the pelvis.   Electronically Signed   By: Skipper Cliche M.D.   On: 12/27/2013 23:49   Dg Chest Port 1 View  12/29/2013   CLINICAL DATA:  Difficulty breathing   EXAM: PORTABLE CHEST - 1 VIEW  COMPARISON:  December 27, 2013  FINDINGS: There is cardiomegaly with bilateral effusions. There is patchy interstitial and alveolar edema bilaterally. No adenopathy. There is atherosclerotic change throughout the aorta. There is acromioclavicular separation on the left. There is evidence of an old right clavicle fracture.  IMPRESSION: Evidence of congestive heart failure. Areas  of airspace opacity potentially could represent superimposed pneumonia. Chronic appearing acromioclavicular separation on the left.   Electronically Signed   By: Lowella Grip M.D.   On: 12/29/2013 10:43   Dg Chest Port 1 View  12/27/2013   CLINICAL DATA:  Shortness of breath with cough and weakness.  EXAM: PORTABLE CHEST - 1 VIEW  COMPARISON:  10/13/2013.  FINDINGS: 2209 hrs. Vascular congestion with associated pulmonary edema. There is bilateral pleural effusions, left greater than right. Cardiopericardial silhouette is markedly enlarged. Bones are diffusely demineralized. Telemetry leads overlie the chest.  IMPRESSION: Cardiomegaly with pulmonary edema and bilateral pleural effusions.   Electronically Signed   By: Misty Stanley M.D.   On: 12/27/2013 22:57   Dg Abd Portable 1v  12/27/2013   CLINICAL DATA:  Abdominal discomfort, weakness and shortness of breath.  EXAM: PORTABLE ABDOMEN - 1 VIEW  COMPARISON:  Abdominal radiograph performed 10/14/2013  FINDINGS: There is slight interval improvement in colonic distention, with apparent stool noted throughout the colon. Scattered air-filled loops of small bowel are also seen. This may reflect residual ileus, improved from the prior study. No free intra-abdominal air is identified, though evaluation for free air is limited on a single supine view. Scattered clips are noted in the right upper quadrant. The stomach is partially filled with air.  Mild degenerative change is noted along the lumbar spine; the sacroiliac joints are unremarkable in appearance. The  left hip arthroplasty is incompletely imaged but appears grossly unremarkable.  IMPRESSION: Slight interval improvement in colonic distention, with apparent stool noted throughout the colon. This may reflect residual ileus, improved from the prior study. No free intra-abdominal air seen.   Electronically Signed   By: Garald Balding M.D.   On: 12/27/2013 22:57    Scheduled Meds: . acetaminophen  1,000 mg Oral TID  . antiseptic oral rinse  15 mL Mouth Rinse BID  . aspirin  81 mg Oral Daily  . carvedilol  3.125 mg Oral Daily  . cholecalciferol  1,000 Units Oral Daily  . clopidogrel  75 mg Oral Daily  . cyanocobalamin  500 mcg Oral Daily  . cyclobenzaprine  5 mg Oral QHS  . donepezil  10 mg Oral QHS  . dorzolamide  1 drop Both Eyes TID  . feeding supplement (GLUCERNA SHAKE)  237 mL Oral BID BM  . ferrous sulfate  325 mg Oral Q breakfast  . furosemide  40 mg Intravenous 3 times per day  . gabapentin  300 mg Oral BID  . heparin  5,000 Units Subcutaneous 3 times per day  . hydrALAZINE  10 mg Oral TID  . insulin aspart  0-9 Units Subcutaneous TID WC  . latanoprost  2 drop Both Eyes QHS  . loratadine  10 mg Oral Daily  . LORazepam  0.5 mg Oral BID  . pantoprazole  40 mg Oral Daily  . saccharomyces boulardii  250 mg Oral BID  . senna  1 tablet Oral BID  . sertraline  50 mg Oral Daily  . simvastatin  10 mg Oral q1800  . sodium chloride  3 mL Intravenous Q12H  . sodium chloride  3 mL Intravenous Q12H  . traZODone  100 mg Oral QHS  . vancomycin  500 mg Oral 4 times per day  . vitamin C  500 mg Oral Daily   Continuous Infusions:   Active Problems:   Atrial fibrillation with controlled ventricular response   Hypertension   Diabetes mellitus   Coronary artery disease  Dementia   Acute diastolic CHF (congestive heart failure)   Acute on chronic respiratory failure with hypoxemia    Time spent: < 30 minutes   Barton Dubois  [facility] Pager [phone]. If 8PM-8AM, please  contact night-coverage at [email or web address], password [facility] 01/04/2014, 6:21 PM  LOS: 8 days

## 2014-01-05 ENCOUNTER — Inpatient Hospital Stay (HOSPITAL_COMMUNITY): Payer: Medicare Other

## 2014-01-05 DIAGNOSIS — E1122 Type 2 diabetes mellitus with diabetic chronic kidney disease: Secondary | ICD-10-CM | POA: Diagnosis present

## 2014-01-05 DIAGNOSIS — M79675 Pain in left toe(s): Secondary | ICD-10-CM | POA: Diagnosis not present

## 2014-01-05 DIAGNOSIS — R627 Adult failure to thrive: Secondary | ICD-10-CM | POA: Diagnosis present

## 2014-01-05 DIAGNOSIS — M7989 Other specified soft tissue disorders: Secondary | ICD-10-CM | POA: Diagnosis not present

## 2014-01-05 DIAGNOSIS — E860 Dehydration: Secondary | ICD-10-CM | POA: Diagnosis not present

## 2014-01-05 DIAGNOSIS — I209 Angina pectoris, unspecified: Secondary | ICD-10-CM | POA: Diagnosis not present

## 2014-01-05 DIAGNOSIS — F411 Generalized anxiety disorder: Secondary | ICD-10-CM | POA: Diagnosis not present

## 2014-01-05 DIAGNOSIS — O10019 Pre-existing essential hypertension complicating pregnancy, unspecified trimester: Secondary | ICD-10-CM | POA: Diagnosis not present

## 2014-01-05 DIAGNOSIS — R51 Headache: Secondary | ICD-10-CM | POA: Diagnosis not present

## 2014-01-05 DIAGNOSIS — R5381 Other malaise: Secondary | ICD-10-CM

## 2014-01-05 DIAGNOSIS — A0472 Enterocolitis due to Clostridium difficile, not specified as recurrent: Secondary | ICD-10-CM | POA: Diagnosis not present

## 2014-01-05 DIAGNOSIS — IMO0001 Reserved for inherently not codable concepts without codable children: Secondary | ICD-10-CM | POA: Diagnosis not present

## 2014-01-05 DIAGNOSIS — E785 Hyperlipidemia, unspecified: Secondary | ICD-10-CM

## 2014-01-05 DIAGNOSIS — E1159 Type 2 diabetes mellitus with other circulatory complications: Secondary | ICD-10-CM | POA: Diagnosis not present

## 2014-01-05 DIAGNOSIS — H409 Unspecified glaucoma: Secondary | ICD-10-CM | POA: Diagnosis not present

## 2014-01-05 DIAGNOSIS — Z7982 Long term (current) use of aspirin: Secondary | ICD-10-CM | POA: Diagnosis not present

## 2014-01-05 DIAGNOSIS — Z886 Allergy status to analgesic agent status: Secondary | ICD-10-CM | POA: Diagnosis not present

## 2014-01-05 DIAGNOSIS — M79644 Pain in right finger(s): Secondary | ICD-10-CM | POA: Diagnosis not present

## 2014-01-05 DIAGNOSIS — B029 Zoster without complications: Secondary | ICD-10-CM | POA: Diagnosis not present

## 2014-01-05 DIAGNOSIS — B351 Tinea unguium: Secondary | ICD-10-CM | POA: Diagnosis not present

## 2014-01-05 DIAGNOSIS — K5901 Slow transit constipation: Secondary | ICD-10-CM | POA: Diagnosis not present

## 2014-01-05 DIAGNOSIS — Z96649 Presence of unspecified artificial hip joint: Secondary | ICD-10-CM | POA: Diagnosis present

## 2014-01-05 DIAGNOSIS — R109 Unspecified abdominal pain: Secondary | ICD-10-CM | POA: Diagnosis not present

## 2014-01-05 DIAGNOSIS — Z8673 Personal history of transient ischemic attack (TIA), and cerebral infarction without residual deficits: Secondary | ICD-10-CM | POA: Diagnosis not present

## 2014-01-05 DIAGNOSIS — G8929 Other chronic pain: Secondary | ICD-10-CM | POA: Diagnosis not present

## 2014-01-05 DIAGNOSIS — D649 Anemia, unspecified: Secondary | ICD-10-CM | POA: Diagnosis not present

## 2014-01-05 DIAGNOSIS — M19041 Primary osteoarthritis, right hand: Secondary | ICD-10-CM | POA: Diagnosis not present

## 2014-01-05 DIAGNOSIS — I482 Chronic atrial fibrillation: Secondary | ICD-10-CM | POA: Diagnosis present

## 2014-01-05 DIAGNOSIS — Z79899 Other long term (current) drug therapy: Secondary | ICD-10-CM | POA: Diagnosis not present

## 2014-01-05 DIAGNOSIS — R140 Abdominal distension (gaseous): Secondary | ICD-10-CM | POA: Diagnosis not present

## 2014-01-05 DIAGNOSIS — G20 Parkinson's disease: Secondary | ICD-10-CM | POA: Diagnosis present

## 2014-01-05 DIAGNOSIS — I129 Hypertensive chronic kidney disease with stage 1 through stage 4 chronic kidney disease, or unspecified chronic kidney disease: Secondary | ICD-10-CM | POA: Diagnosis present

## 2014-01-05 DIAGNOSIS — E46 Unspecified protein-calorie malnutrition: Secondary | ICD-10-CM | POA: Diagnosis not present

## 2014-01-05 DIAGNOSIS — I517 Cardiomegaly: Secondary | ICD-10-CM | POA: Diagnosis not present

## 2014-01-05 DIAGNOSIS — J9610 Chronic respiratory failure, unspecified whether with hypoxia or hypercapnia: Secondary | ICD-10-CM | POA: Diagnosis not present

## 2014-01-05 DIAGNOSIS — K219 Gastro-esophageal reflux disease without esophagitis: Secondary | ICD-10-CM

## 2014-01-05 DIAGNOSIS — E440 Moderate protein-calorie malnutrition: Secondary | ICD-10-CM | POA: Diagnosis present

## 2014-01-05 DIAGNOSIS — D6489 Other specified anemias: Secondary | ICD-10-CM | POA: Diagnosis not present

## 2014-01-05 DIAGNOSIS — I10 Essential (primary) hypertension: Secondary | ICD-10-CM | POA: Diagnosis not present

## 2014-01-05 DIAGNOSIS — R6889 Other general symptoms and signs: Secondary | ICD-10-CM | POA: Diagnosis not present

## 2014-01-05 DIAGNOSIS — E119 Type 2 diabetes mellitus without complications: Secondary | ICD-10-CM | POA: Diagnosis not present

## 2014-01-05 DIAGNOSIS — G218 Other secondary parkinsonism: Secondary | ICD-10-CM | POA: Diagnosis not present

## 2014-01-05 DIAGNOSIS — N390 Urinary tract infection, site not specified: Secondary | ICD-10-CM | POA: Diagnosis not present

## 2014-01-05 DIAGNOSIS — J189 Pneumonia, unspecified organism: Secondary | ICD-10-CM | POA: Diagnosis not present

## 2014-01-05 DIAGNOSIS — J309 Allergic rhinitis, unspecified: Secondary | ICD-10-CM | POA: Diagnosis not present

## 2014-01-05 DIAGNOSIS — K562 Volvulus: Secondary | ICD-10-CM | POA: Diagnosis not present

## 2014-01-05 DIAGNOSIS — I509 Heart failure, unspecified: Secondary | ICD-10-CM | POA: Diagnosis not present

## 2014-01-05 DIAGNOSIS — F0391 Unspecified dementia with behavioral disturbance: Secondary | ICD-10-CM | POA: Diagnosis not present

## 2014-01-05 DIAGNOSIS — H04123 Dry eye syndrome of bilateral lacrimal glands: Secondary | ICD-10-CM | POA: Diagnosis not present

## 2014-01-05 DIAGNOSIS — Z7902 Long term (current) use of antithrombotics/antiplatelets: Secondary | ICD-10-CM | POA: Diagnosis not present

## 2014-01-05 DIAGNOSIS — F329 Major depressive disorder, single episode, unspecified: Secondary | ICD-10-CM | POA: Diagnosis not present

## 2014-01-05 DIAGNOSIS — N183 Chronic kidney disease, stage 3 (moderate): Secondary | ICD-10-CM | POA: Diagnosis present

## 2014-01-05 DIAGNOSIS — D509 Iron deficiency anemia, unspecified: Secondary | ICD-10-CM | POA: Diagnosis not present

## 2014-01-05 DIAGNOSIS — Z86718 Personal history of other venous thrombosis and embolism: Secondary | ICD-10-CM | POA: Diagnosis not present

## 2014-01-05 DIAGNOSIS — J069 Acute upper respiratory infection, unspecified: Secondary | ICD-10-CM | POA: Diagnosis not present

## 2014-01-05 DIAGNOSIS — N171 Acute kidney failure with acute cortical necrosis: Secondary | ICD-10-CM | POA: Diagnosis not present

## 2014-01-05 DIAGNOSIS — M79674 Pain in right toe(s): Secondary | ICD-10-CM | POA: Diagnosis not present

## 2014-01-05 DIAGNOSIS — M79676 Pain in unspecified toe(s): Secondary | ICD-10-CM | POA: Diagnosis not present

## 2014-01-05 DIAGNOSIS — M79641 Pain in right hand: Secondary | ICD-10-CM | POA: Diagnosis not present

## 2014-01-05 DIAGNOSIS — Z9981 Dependence on supplemental oxygen: Secondary | ICD-10-CM | POA: Diagnosis not present

## 2014-01-05 DIAGNOSIS — Z9049 Acquired absence of other specified parts of digestive tract: Secondary | ICD-10-CM | POA: Diagnosis present

## 2014-01-05 DIAGNOSIS — Z87891 Personal history of nicotine dependence: Secondary | ICD-10-CM | POA: Diagnosis not present

## 2014-01-05 DIAGNOSIS — H4011X2 Primary open-angle glaucoma, moderate stage: Secondary | ICD-10-CM | POA: Diagnosis not present

## 2014-01-05 DIAGNOSIS — J9620 Acute and chronic respiratory failure, unspecified whether with hypoxia or hypercapnia: Secondary | ICD-10-CM | POA: Diagnosis not present

## 2014-01-05 DIAGNOSIS — R1310 Dysphagia, unspecified: Secondary | ICD-10-CM | POA: Diagnosis not present

## 2014-01-05 DIAGNOSIS — E876 Hypokalemia: Secondary | ICD-10-CM | POA: Diagnosis not present

## 2014-01-05 DIAGNOSIS — E039 Hypothyroidism, unspecified: Secondary | ICD-10-CM | POA: Diagnosis not present

## 2014-01-05 DIAGNOSIS — H4011X3 Primary open-angle glaucoma, severe stage: Secondary | ICD-10-CM | POA: Diagnosis not present

## 2014-01-05 DIAGNOSIS — E1142 Type 2 diabetes mellitus with diabetic polyneuropathy: Secondary | ICD-10-CM | POA: Diagnosis present

## 2014-01-05 DIAGNOSIS — F419 Anxiety disorder, unspecified: Secondary | ICD-10-CM | POA: Diagnosis not present

## 2014-01-05 DIAGNOSIS — I2510 Atherosclerotic heart disease of native coronary artery without angina pectoris: Secondary | ICD-10-CM | POA: Diagnosis present

## 2014-01-05 DIAGNOSIS — R319 Hematuria, unspecified: Secondary | ICD-10-CM | POA: Diagnosis not present

## 2014-01-05 DIAGNOSIS — R918 Other nonspecific abnormal finding of lung field: Secondary | ICD-10-CM | POA: Diagnosis not present

## 2014-01-05 DIAGNOSIS — D696 Thrombocytopenia, unspecified: Secondary | ICD-10-CM | POA: Diagnosis not present

## 2014-01-05 DIAGNOSIS — G4700 Insomnia, unspecified: Secondary | ICD-10-CM | POA: Diagnosis not present

## 2014-01-05 DIAGNOSIS — F0390 Unspecified dementia without behavioral disturbance: Secondary | ICD-10-CM | POA: Diagnosis not present

## 2014-01-05 DIAGNOSIS — R1110 Vomiting, unspecified: Secondary | ICD-10-CM | POA: Diagnosis not present

## 2014-01-05 DIAGNOSIS — I25119 Atherosclerotic heart disease of native coronary artery with unspecified angina pectoris: Secondary | ICD-10-CM | POA: Diagnosis not present

## 2014-01-05 DIAGNOSIS — R5383 Other fatigue: Secondary | ICD-10-CM

## 2014-01-05 DIAGNOSIS — Z90710 Acquired absence of both cervix and uterus: Secondary | ICD-10-CM | POA: Diagnosis not present

## 2014-01-05 DIAGNOSIS — G629 Polyneuropathy, unspecified: Secondary | ICD-10-CM | POA: Diagnosis not present

## 2014-01-05 DIAGNOSIS — H5231 Anisometropia: Secondary | ICD-10-CM | POA: Diagnosis not present

## 2014-01-05 DIAGNOSIS — M19042 Primary osteoarthritis, left hand: Secondary | ICD-10-CM | POA: Diagnosis not present

## 2014-01-05 DIAGNOSIS — R112 Nausea with vomiting, unspecified: Secondary | ICD-10-CM | POA: Diagnosis present

## 2014-01-05 DIAGNOSIS — Z515 Encounter for palliative care: Secondary | ICD-10-CM | POA: Diagnosis not present

## 2014-01-05 DIAGNOSIS — B9620 Unspecified Escherichia coli [E. coli] as the cause of diseases classified elsewhere: Secondary | ICD-10-CM | POA: Diagnosis present

## 2014-01-05 DIAGNOSIS — I9589 Other hypotension: Secondary | ICD-10-CM | POA: Diagnosis not present

## 2014-01-05 DIAGNOSIS — K56609 Unspecified intestinal obstruction, unspecified as to partial versus complete obstruction: Secondary | ICD-10-CM | POA: Diagnosis not present

## 2014-01-05 DIAGNOSIS — K567 Ileus, unspecified: Secondary | ICD-10-CM | POA: Diagnosis not present

## 2014-01-05 DIAGNOSIS — Z1331 Encounter for screening for depression: Secondary | ICD-10-CM | POA: Diagnosis not present

## 2014-01-05 DIAGNOSIS — R1084 Generalized abdominal pain: Secondary | ICD-10-CM | POA: Diagnosis not present

## 2014-01-05 DIAGNOSIS — D638 Anemia in other chronic diseases classified elsewhere: Secondary | ICD-10-CM | POA: Diagnosis not present

## 2014-01-05 DIAGNOSIS — I5031 Acute diastolic (congestive) heart failure: Secondary | ICD-10-CM | POA: Diagnosis not present

## 2014-01-05 DIAGNOSIS — J90 Pleural effusion, not elsewhere classified: Secondary | ICD-10-CM | POA: Diagnosis not present

## 2014-01-05 DIAGNOSIS — K529 Noninfective gastroenteritis and colitis, unspecified: Secondary | ICD-10-CM | POA: Diagnosis not present

## 2014-01-05 DIAGNOSIS — I5032 Chronic diastolic (congestive) heart failure: Secondary | ICD-10-CM | POA: Diagnosis not present

## 2014-01-05 DIAGNOSIS — I4891 Unspecified atrial fibrillation: Secondary | ICD-10-CM | POA: Diagnosis not present

## 2014-01-05 LAB — BASIC METABOLIC PANEL
ANION GAP: 9 (ref 5–15)
BUN: 23 mg/dL (ref 6–23)
CALCIUM: 9.1 mg/dL (ref 8.4–10.5)
CO2: 29 mEq/L (ref 19–32)
Chloride: 101 mEq/L (ref 96–112)
Creatinine, Ser: 1.4 mg/dL — ABNORMAL HIGH (ref 0.50–1.10)
GFR calc Af Amer: 38 mL/min — ABNORMAL LOW (ref >90)
GFR, EST NON AFRICAN AMERICAN: 33 mL/min — AB (ref >90)
GLUCOSE: 136 mg/dL — AB (ref 70–99)
POTASSIUM: 4.2 meq/L (ref 3.7–5.3)
SODIUM: 139 meq/L (ref 137–147)

## 2014-01-05 MED ORDER — FUROSEMIDE 40 MG PO TABS: 40.0000 mg | ORAL_TABLET | Freq: Two times a day (BID) | ORAL | Status: DC

## 2014-01-05 MED ORDER — ALPRAZOLAM 0.25 MG PO TABS: 0.2500 mg | ORAL_TABLET | Freq: Every day | ORAL | Status: DC | PRN

## 2014-01-05 MED ORDER — SENNA 8.6 MG PO TABS: 1.0000 | ORAL_TABLET | Freq: Two times a day (BID) | ORAL | Status: DC

## 2014-01-05 MED ORDER — AMLODIPINE BESYLATE 2.5 MG PO TABS
2.5000 mg | ORAL_TABLET | Freq: Every day | ORAL | Status: DC
Start: 2014-01-06 — End: 2015-10-28

## 2014-01-05 MED ORDER — LORAZEPAM 0.5 MG PO TABS: 0.5000 mg | ORAL_TABLET | Freq: Three times a day (TID) | ORAL | Status: DC | PRN

## 2014-01-05 MED ORDER — FUROSEMIDE 40 MG PO TABS
40.0000 mg | ORAL_TABLET | Freq: Two times a day (BID) | ORAL | Status: DC
  Administered 2014-01-05: 40 mg via ORAL
  Filled 2014-01-05 (×3): qty 1

## 2014-01-05 MED ORDER — SACCHAROMYCES BOULARDII 250 MG PO CAPS: 250.0000 mg | ORAL_CAPSULE | Freq: Two times a day (BID) | ORAL | Status: DC

## 2014-01-05 MED ORDER — GLUCERNA SHAKE PO LIQD: 237.0000 mL | Freq: Two times a day (BID) | ORAL | Status: DC

## 2014-01-05 MED ORDER — VANCOMYCIN 50 MG/ML ORAL SOLUTION: 500.0000 mg | Freq: Four times a day (QID) | ORAL | Status: AC

## 2014-01-05 NOTE — Progress Notes (Signed)
Patient is set to discharge back to [facility] today. Patient & daughter, [name] at bedside aware. Discharge packet in [place], [place] aware. [facility] scheduled for transport pickup @ 3:30pm.   Raynaldo Opitz, [place] Worker cell #: [phone]

## 2014-01-05 NOTE — Discharge Summary (Signed)
Physician Discharge Summary  Maria Christian AYT:[id number] DOB: 05-13-1925 DOA: 12/27/2013  PCP: Hollace Kinnier, DO  Admit date: 12/27/2013 Discharge date: 01/05/2014  Time spent: >30 minutes  Recommendations for Outpatient Follow-up:  1. BMET to follow electrolytes and renal function 2. Adjust BP medications and diuretics as needed  Discharge Diagnoses:  Active Problems:   Atrial fibrillation with controlled ventricular response   Hypertension   Diabetes mellitus   Coronary artery disease   Dementia   Acute diastolic CHF (congestive heart failure)   Acute on chronic respiratory failure with hypoxemia   Discharge Condition: stable and improved. Patient breathing back to baseline and good O2 sat on chronic @L  home oxygen supplementation. Will follow with PCP in 10 days   Diet recommendation: low sodium diet (less than 2G daily)  Filed Weights   01/03/14 0526 01/04/14 0444 01/05/14 0609  Weight: 61.9 kg (136 lb 7.4 oz) 63.1 kg (139 lb 1.8 oz) 62 kg (136 lb 11 oz)    History of present illness:  80 y.o. female with a Past Medical History of chronic diastolic heart failure, atrial fibrillation not on anticoagulation secondary to risk of falls, dementia, diabetes, Parkinson's disease who presents today with the above noted complaint. Please note, patient is a poor historian, most of this history is obtained from talking with the daughter at bedside. Apparently a week or so ago, patient recently had a death in the family- her son passed away. Since then, patient has had progressively worsening shortness of breath, it was initially felt that this is related to anxiety. However her shortness of breath continued to get worsened, as a result patient was brought to the emergency room for further evaluation and treatment, she was found to have acute diastolic heart failure and I was asked to admit this patient for further evaluation and treatment.  Per daughter at bedside, patient is essentially  bedbound, she gets put on a wheelchair with the help of a Hoyer lift. She also has a history of dementia with Parkinson's disease. Per daughter, patient has chronic right leg swelling.   Hospital Course:  . Acute on chronic respiratory failure with hypoxemia  - Patient apparently has chronic respiratory failure and is on home O2. Her acute hypoxic respiratory failure secondary to acute diastolic heart failure.  -continue now lasix with adjusted dose to 40 Mg BID.  -Breathing better and at baseline now.  -Chest x ray with areas of infiltrate, but no signs of infection. Levaquin was dc'ed 7/29 due to positive C diff and limited signs/symptoms to suggest active infection..  -repeated CXR demonstrating improvement in vascular congestion and aeration. No new infiltrates -close follow up of fluid intake, sodium ingestion and daily weights -advise to follow low sodium diet  .?Partially loculated R. Pleural Effusion  -CT demonstrated free flow and subsequent x-ray demonstrated improvement in amount of fluid collection (suggesting  pulmonary edema)  C diff;  -Continue oral vancomycin therapy.  -will continue probiotic/florastor and follow, clinical response  . Acute diastolic CHF (congestive heart failure)  - Chest x-ray does show bilateral pleural effusion and pulmonary edema, BNP is significantly elevated. No fever or leukocytosis to suggest pneumonia.  -Continue with IV lasix 40 Mg TID.  -urine out put 2.2 L. Weight 71.8 ----70.5---66    Right LE edema: Bilateral Duplex negative for DVT.   Marland Kitchen Hypertension  - Currently controlled with Coreg, and hydralazine. -Will resume low dose norvasc -patient will be on higher dose of lasix -advise to follow low sodium  diet  . Dilated bowels seen on x-rays/CT of the abdomen ; C diff positive. Started on vancomycin orally.  - Has a history of sigmoid volvulus in the past. Abdomen is nondistended.  -diarrhea improving -close electrolytes follow up.    . Atrial fibrillation with controlled ventricular response  - Rate controlled with Coreg, suspect not a anticoagulation candidate given advanced age, fall risk   . Dementia with Parkinson's disease  - condition currently At baseline. - Continue Aricept, benzodiazepines, trazodone and Zoloft.   . Coronary artery disease  - Stable without any chest pain. Troponin negative.  - Continue antiplatelet agents, statins, beta blockers.   . Diabetes mellitus  - Diet controlled as outpatient. A1c 5.8.    Renal failure: due to aggressive diuresis. -Cr trending down and lasix dose adjusted -Follow BMET during follow up visit.   Procedures: See below for x-ray results   Consultations:  None   Discharge Exam: Filed Vitals:   01/05/14 0609  BP: 131/66  Pulse: 84  Temp: 98 F (36.7 C)  Resp: 18   General: alert & oriented x 3 and In NAD; afebrile Cardiovascular: RRR, nl S1 s2  Respiratory: Decreased breath sounds at the bases (R > L), scattered rhonchi, no frank crackles and no wheezing Abdomen: soft +BS NT/ND, no masses palpable  Extremities: No cyanosis and no edema    Discharge Instructions You were cared for by a hospitalist during your hospital stay. If you have any questions about your discharge medications or the care you received while you were in the hospital after you are discharged, you can call the unit and asked to speak with the hospitalist on call if the hospitalist that took care of you is not available. Once you are discharged, your primary care physician will handle any further medical issues. Please note that NO REFILLS for any discharge medications will be authorized once you are discharged, as it is imperative that you return to your primary care physician (or establish a relationship with a primary care physician if you do not have one) for your aftercare needs so that they can reassess your need for medications and monitor your lab values.  Discharge  Instructions   Diet - low sodium heart healthy    Complete by:  As directed      Discharge instructions    Complete by:  As directed   Take medications as prescribed Follow a low sodium diet (less than 2 grams daily) Daily weight (contcat PCP office with any changes of more than 3 pounds overnight or 5 pounds in a week) Fluid restriction to 2L water per day            Medication List         acetaminophen 500 MG tablet  Commonly known as:  TYLENOL  Take 1,000 mg by mouth 3 (three) times daily.     ALPRAZolam 0.25 MG tablet  Commonly known as:  XANAX  Take 1 tablet (0.25 mg total) by mouth daily as needed for anxiety.     amLODipine 2.5 MG tablet  Commonly known as:  NORVASC  Take 1 tablet (2.5 mg total) by mouth daily.  Start taking on:  01/06/2014     aspirin 81 MG chewable tablet  Chew 81 mg by mouth daily.     carvedilol 3.125 MG tablet  Commonly known as:  COREG  Take 3.125 mg by mouth daily.     CERTAGEN PO  Take 1 tablet by mouth daily.  cholecalciferol 1000 UNITS tablet  Commonly known as:  VITAMIN D  Take 1,000 Units by mouth daily.     clopidogrel 75 MG tablet  Commonly known as:  PLAVIX  Take 75 mg by mouth daily.     Cranberry 475 MG Caps  Take 1 capsule by mouth 2 (two) times daily.     cyclobenzaprine 5 MG tablet  Commonly known as:  FLEXERIL  Take 5 mg by mouth at bedtime.     donepezil 10 MG tablet  Commonly known as:  ARICEPT  Take 10 mg by mouth at bedtime.     dorzolamide 2 % ophthalmic solution  Commonly known as:  TRUSOPT  Place 1 drop into both eyes 3 (three) times daily.     feeding supplement (GLUCERNA SHAKE) Liqd  Take 237 mLs by mouth 2 (two) times daily between meals.     ferrous sulfate 325 (65 FE) MG tablet  Take 325 mg by mouth daily with breakfast.     furosemide 40 MG tablet  Commonly known as:  LASIX  Take 1 tablet (40 mg total) by mouth 2 (two) times daily.     gabapentin 300 MG capsule  Commonly known as:   NEURONTIN  Take 300 mg by mouth 2 (two) times daily.     hydrALAZINE 10 MG tablet  Commonly known as:  APRESOLINE  Take 10 mg by mouth 3 (three) times daily. HOLD if SBP <100/60 and pulse is less than 60     lidocaine 5 %  Commonly known as:  LIDODERM  Place 1 patch onto the skin daily. Apply 1 patch to left lower back/hip every morning.  Remove & Discard patch within 12 hours or as directed by MD     loratadine 10 MG tablet  Commonly known as:  CLARITIN  Take 10 mg by mouth daily.     LORazepam 0.5 MG tablet  Commonly known as:  ATIVAN  Take 1 tablet (0.5 mg total) by mouth every 8 (eight) hours as needed for anxiety.     potassium chloride SA 20 MEQ tablet  Commonly known as:  K-DUR,KLOR-CON  Take 20 mEq by mouth daily.     pravastatin 20 MG tablet  Commonly known as:  PRAVACHOL  Take 20 mg by mouth at bedtime.     saccharomyces boulardii 250 MG capsule  Commonly known as:  FLORASTOR  Take 1 capsule (250 mg total) by mouth 2 (two) times daily.     senna 8.6 MG Tabs tablet  Commonly known as:  SENOKOT  Take 1 tablet (8.6 mg total) by mouth 2 (two) times daily.     sertraline 50 MG tablet  Commonly known as:  ZOLOFT  Take 50 mg by mouth daily.     sodium bicarbonate 650 MG tablet  Take 1,300 mg by mouth 3 (three) times daily.     SYSTANE 0.4-0.3 % Soln  Generic drug:  Polyethyl Glycol-Propyl Glycol  Place 1 drop into both eyes 4 (four) times daily as needed (dry eyes).     Travoprost (BAK Free) 0.004 % Soln ophthalmic solution  Commonly known as:  TRAVATAN  Place 2 drops into both eyes at bedtime.     traZODone 50 MG tablet  Commonly known as:  DESYREL  Take 100 mg by mouth at bedtime.     vancomycin 50 mg/mL oral solution  Commonly known as:  VANCOCIN  Take 10 mLs (500 mg total) by mouth every 6 (six) hours.  vitamin B-12 500 MCG tablet  Commonly known as:  CYANOCOBALAMIN  Take 500 mcg by mouth daily.     vitamin C 500 MG tablet  Commonly known as:   ASCORBIC ACID  Take 500 mg by mouth daily.     VOLTAREN 1 % Gel  Generic drug:  diclofenac sodium  Apply 2 g topically 2 (two) times daily as needed (left neck pain).       Allergies  Allergen Reactions  . Aspirin Other (See Comments)    G.I. Upset only       Follow-up Information   Follow up with REED, TIFFANY, DO. Schedule an appointment as soon as possible for a visit in 10 days.   Specialty:  Geriatric Medicine   Contact information:   [address] [address] [phone]        The results of significant diagnostics from this hospitalization (including imaging, microbiology, ancillary and laboratory) are listed below for reference.    Significant Diagnostic Studies: Dg Chest 2 View  01/05/2014   CLINICAL DATA:  Follow up pleural effusions  EXAM: CHEST  2 VIEW  COMPARISON:  CT chest 01/01/2014  FINDINGS: There are bilateral small pleural effusions. There is mild bilateral interstitial thickening. There is bilateral interstitial thickening. There is no pneumothorax. There is stable cardiomegaly.  The osseous structures are unremarkable.  IMPRESSION: Overall findings most consistent with pulmonary edema.   Electronically Signed   By: Kathreen Devoid   On: 01/05/2014 08:57   Dg Chest 2 View  01/01/2014   CLINICAL DATA:  Shortness of breath, followup pulmonary edema, history atrial fibrillation, Parkinson's, hypertension, diabetes, hyperlipidemia, coronary artery disease  EXAM: CHEST  2 VIEW  COMPARISON:  12/29/2013  FINDINGS: Enlargement of cardiac silhouette with pulmonary vascular congestion.  Bibasilar effusions and atelectasis.  Partial loculation of a pleural effusion posteriorly on the lateral view likely on RIGHT.  Perihilar edema, slightly improved.  No pneumothorax or acute osseous findings.  Bones appear demineralized.  IMPRESSION: Slightly improved pulmonary edema.  Bibasilar effusions and atelectasis, with RIGHT pleural effusion appearing partially loculated.    Electronically Signed   By: Lavonia Dana M.D.   On: 01/01/2014 08:34   Ct Chest W Contrast  01/01/2014   CLINICAL DATA:  Abnormal chest x-ray.  Evaluate for cancer.  EXAM: CT CHEST WITH CONTRAST  TECHNIQUE: Multidetector CT imaging of the chest was performed during intravenous contrast administration.  CONTRAST:  82mL OMNIPAQUE IOHEXOL 300 MG/ML  SOLN  COMPARISON:  Chest 01/01/2014  FINDINGS: Cardiac enlargement. Small pericardial effusion. Calcification of Coronary arteries and aorta. Normal caliber thoracic aorta without aneurysm. Great vessel origins are patent. 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 No significant lymphadenopathy in the chest. Evaluation of lungs is limited due to respiratory motion but there is diffuse patchy mosaic pattern to the lungs consistent with edema. Moderate-sized bilateral pleural effusions. Atelectasis or consolidation in the lung bases. Surgical absence of the gallbladder. Horseshoe kidney. Vascular calcifications in the upper abdomen. Degenerative changes in the spine. No destructive bone lesions.  IMPRESSION: Cardiac enlargement with diffuse pulmonary edema, bilateral pleural effusions, and basilar atelectasis/ consolidation. Coronary artery calcification   Electronically Signed   By: Lucienne Capers M.D.   On: 01/01/2014 21:33   Ct Abdomen Pelvis W Contrast  12/27/2013   CLINICAL DATA:  Constipation in weakness, shortness of breath, abdominal distention  EXAM: CT ABDOMEN AND PELVIS WITH CONTRAST  TECHNIQUE: Multidetector CT imaging of the abdomen and pelvis was performed using the standard protocol following bolus administration of intravenous contrast.  CONTRAST:  158mL OMNIPAQUE IOHEXOL 300 MG/ML  SOLN  COMPARISON:  12/27/2013 plain films, 10/09/2013 CT scan  FINDINGS: There are moderate to large bilateral pleural effusions with underlying compressive atelectasis. There is cardiomegaly.  There  is fatty infiltration of the liver. Gallbladder is surgically absent. Spleen is normal. Adrenal glands are normal. There is a horseshoe kidney with out significant abnormalities.  Pancreas is normal. Abdominal aorta is significantly calcified. Stomach and small bowel appear normal. The large bowel is normal except for decompression of the distal most sigmoid and rectum likely due to peristalsis. There is beam attenuation artifact limiting evaluation of the pelvis related to the left hip replacement. There is mild fecal retention throughout the colon.  Allowing for this, bladder appears normal. There is a small volume of ascites in the pelvis. There are no acute musculoskeletal findings.  IMPRESSION: Similar to prior study, findings are most consistent with colonic ileus. There is mild fecal retention.  Moderate to large pleural effusions, larger on the right, with underlying compressive atelectasis.  Small volume ascites in the pelvis.   Electronically Signed   By: Skipper Cliche M.D.   On: 12/27/2013 23:49   Dg Chest Port 1 View  12/29/2013   CLINICAL DATA:  Difficulty breathing  EXAM: PORTABLE CHEST - 1 VIEW  COMPARISON:  December 27, 2013  FINDINGS: There is cardiomegaly with bilateral effusions. There is patchy interstitial and alveolar edema bilaterally. No adenopathy. There is atherosclerotic change throughout the aorta. There is acromioclavicular separation on the left. There is evidence of an old right clavicle fracture.  IMPRESSION: Evidence of congestive heart failure. Areas of airspace opacity potentially could represent superimposed pneumonia. Chronic appearing acromioclavicular separation on the left.   Electronically Signed   By: Lowella Grip M.D.   On: 12/29/2013 10:43   Dg Chest Port 1 View  12/27/2013   CLINICAL DATA:  Shortness of breath with cough and weakness.  EXAM: PORTABLE CHEST - 1 VIEW  COMPARISON:  10/13/2013.  FINDINGS: 2209 hrs. Vascular congestion with associated pulmonary edema.  There is bilateral pleural effusions, left greater than right. Cardiopericardial silhouette is markedly enlarged. Bones are diffusely demineralized. Telemetry leads overlie the chest.  IMPRESSION: Cardiomegaly with pulmonary edema and bilateral pleural effusions.   Electronically Signed   By: Misty Stanley M.D.   On: 12/27/2013 22:57   Dg Abd Portable 1v  12/27/2013   CLINICAL DATA:  Abdominal discomfort, weakness and shortness of breath.  EXAM: PORTABLE ABDOMEN - 1 VIEW  COMPARISON:  Abdominal radiograph performed 10/14/2013  FINDINGS: There is slight interval improvement in colonic distention, with apparent stool noted throughout the colon. Scattered air-filled loops of small bowel are also seen. This may reflect residual ileus, improved from the prior study. No free intra-abdominal air is identified, though evaluation for free air is limited on a single supine view. Scattered clips are noted in the right upper quadrant. The stomach is partially filled with air.  Mild degenerative change is noted along the lumbar spine; the sacroiliac joints are unremarkable in appearance. The left hip arthroplasty is incompletely imaged but appears grossly unremarkable.  IMPRESSION: Slight interval improvement in colonic distention, with apparent stool noted throughout the colon. This may reflect residual ileus, improved from the prior study. No free intra-abdominal air seen.   Electronically Signed   By: Garald Balding M.D.   On: 12/27/2013  22:57    Microbiology: Recent Results (from the past 240 hour(s))  MRSA PCR SCREENING     Status: None   Collection Time    12/28/13  1:30 AM      Result Value Ref Range Status   MRSA by PCR NEGATIVE  NEGATIVE Final   Comment:            The GeneXpert MRSA Assay (FDA     approved for NASAL specimens     only), is one component of a     comprehensive MRSA colonization     surveillance program. It is not     intended to diagnose MRSA     infection nor to guide or      monitor treatment for     MRSA infections.  CLOSTRIDIUM DIFFICILE BY PCR     Status: Abnormal   Collection Time    12/29/13 10:19 PM      Result Value Ref Range Status   C difficile by pcr POSITIVE (*) NEGATIVE Final   Comment: CRITICAL RESULT CALLED TO, READ BACK BY AND VERIFIED WITH:     G.SISON,RN 12/30/13 [id number] BSLADE     Performed at [facility] DIFFICILE BY PCR     Status: Abnormal   Collection Time    12/31/13  8:17 AM      Result Value Ref Range Status   C difficile by pcr POSITIVE (*) NEGATIVE Final   Comment: CRITICAL RESULT CALLED TO, READ BACK BY AND VERIFIED WITH:     Jolyn Lent RN 11:45 12/31/13 ([email or web address])     Performed at [facility]: Basic Metabolic Panel:  Recent Labs Lab 12/31/13 0445 01/02/14 0415 01/03/14 0421 01/04/14 0519 01/05/14 0432  NA 143 140 142 142 139  K 3.2* 2.6* 2.9* 4.3 4.2  CL 97 97 100 103 101  CO2 37* 31 33* 28 29  GLUCOSE 122* 147* 137* 116* 136*  BUN 13 19 18 18 23   CREATININE 0.92 1.14* 1.14* 1.18* 1.40*  CALCIUM 9.0 8.8 9.1 9.1 9.1  MG  --   --  1.9  --   --    Liver Function Tests:  Recent Labs Lab 01/04/14 0519  AST 53*  ALT 16  ALKPHOS 69  BILITOT 0.3  PROT 6.5  ALBUMIN 3.2*   CBC:  Recent Labs Lab 12/30/13 0900 12/31/13 0445 01/02/14 0415 01/04/14 0519  WBC 4.2 4.1 3.8* 4.2  HGB 9.2* 8.2* 8.4* 8.8*  HCT 29.8* 26.5* 26.3* 27.3*  MCV 109.6* 108.6* 106.9* 106.6*  PLT 137* 130* 129* 130*   BNP: BNP (last 3 results)  Recent Labs  05/24/13 0250 12/27/13 2146 12/29/13 1039  PROBNP 3915.0* 7477.0* 11594.0*   CBG:  Recent Labs Lab 01/03/14 2150 01/04/14 0751 01/04/14 1150 01/04/14 1711 01/04/14 2257  GLUCAP 98 123* 142* 109* 138*    Signed:  Barton Dubois  [facility] 01/05/2014, 12:13 PM

## 2014-01-06 ENCOUNTER — Non-Acute Institutional Stay (SKILLED_NURSING_FACILITY): Payer: Medicare Other | Admitting: Internal Medicine

## 2014-01-06 ENCOUNTER — Encounter: Payer: Self-pay | Admitting: Internal Medicine

## 2014-01-06 DIAGNOSIS — I2510 Atherosclerotic heart disease of native coronary artery without angina pectoris: Secondary | ICD-10-CM

## 2014-01-06 DIAGNOSIS — J9621 Acute and chronic respiratory failure with hypoxia: Secondary | ICD-10-CM

## 2014-01-06 DIAGNOSIS — J90 Pleural effusion, not elsewhere classified: Secondary | ICD-10-CM

## 2014-01-06 DIAGNOSIS — I25119 Atherosclerotic heart disease of native coronary artery with unspecified angina pectoris: Secondary | ICD-10-CM

## 2014-01-06 DIAGNOSIS — J9620 Acute and chronic respiratory failure, unspecified whether with hypoxia or hypercapnia: Secondary | ICD-10-CM

## 2014-01-06 DIAGNOSIS — A0472 Enterocolitis due to Clostridium difficile, not specified as recurrent: Secondary | ICD-10-CM | POA: Diagnosis not present

## 2014-01-06 DIAGNOSIS — I209 Angina pectoris, unspecified: Secondary | ICD-10-CM | POA: Diagnosis not present

## 2014-01-06 DIAGNOSIS — R600 Localized edema: Secondary | ICD-10-CM

## 2014-01-06 DIAGNOSIS — E119 Type 2 diabetes mellitus without complications: Secondary | ICD-10-CM

## 2014-01-06 DIAGNOSIS — F0390 Unspecified dementia without behavioral disturbance: Secondary | ICD-10-CM

## 2014-01-06 DIAGNOSIS — N179 Acute kidney failure, unspecified: Secondary | ICD-10-CM

## 2014-01-06 DIAGNOSIS — I5032 Chronic diastolic (congestive) heart failure: Secondary | ICD-10-CM

## 2014-01-06 DIAGNOSIS — I509 Heart failure, unspecified: Secondary | ICD-10-CM

## 2014-01-06 DIAGNOSIS — I4891 Unspecified atrial fibrillation: Secondary | ICD-10-CM

## 2014-01-06 DIAGNOSIS — R609 Edema, unspecified: Secondary | ICD-10-CM

## 2014-01-06 DIAGNOSIS — I10 Essential (primary) hypertension: Secondary | ICD-10-CM

## 2014-01-06 LAB — GLUCOSE, CAPILLARY
Glucose-Capillary: 140 mg/dL — ABNORMAL HIGH (ref 70–99)
Glucose-Capillary: 148 mg/dL — ABNORMAL HIGH (ref 70–99)

## 2014-01-06 NOTE — Assessment & Plan Note (Signed)
Diet controlled as outpatient. A1c 5.8.

## 2014-01-06 NOTE — Assessment & Plan Note (Signed)
-  CT demonstrated free flow and subsequent x-ray demonstrated improvement in amount of fluid collection (suggesting  pulmonary edema

## 2014-01-06 NOTE — Assessment & Plan Note (Signed)
Patient apparently has chronic respiratory failure and is on home O2. Her acute hypoxic respiratory failure secondary to acute diastolic heart failure.  -continue now lasix with adjusted dose to 40 Mg BID.  -Breathing better and at baseline now.  -Chest x ray with areas of infiltrate, but no signs of infection. Levaquin was dc'ed 7/29 due to positive C diff and limited signs/symptoms to suggest active infection..  -repeated CXR demonstrating improvement in vascular congestion and aeration. No new infiltrates  -close follow up of fluid intake, sodium ingestion and daily weights  -advise to follow low sodium diet

## 2014-01-06 NOTE — Assessment & Plan Note (Signed)
condition currently At baseline.  - Continue Aricept, benzodiazepines, trazodone and Zoloft

## 2014-01-06 NOTE — Assessment & Plan Note (Signed)
Rate controlled with Coreg, suspect not a anticoagulation candidate given advanced age, fall risk

## 2014-01-06 NOTE — Assessment & Plan Note (Signed)
Continue oral vancomycin therapy.  -will continue probiotic/florastor and follow, clinical response

## 2014-01-06 NOTE — Assessment & Plan Note (Signed)
Stable without any chest pain. Troponin negative.  - Continue antiplatelet agents, statins, beta blockers

## 2014-01-06 NOTE — Assessment & Plan Note (Signed)
due to aggressive diuresis.  -Cr trending down and lasix dose adjusted  -Follow BMET during follow up visit

## 2014-01-06 NOTE — Assessment & Plan Note (Signed)
Bilateral Duplex negative for DVT

## 2014-01-06 NOTE — Progress Notes (Signed)
MRN: [id number] Name: Maria Christian  Sex: female Age: 80 y.o. DOB: 1924-12-05  [place] #: [email or web address] Facility/Room:220A Level Of Care: SNF Provider: Inocencio Homes D Emergency Contacts: Extended Emergency Contact Information Primary Emergency Contact: [name] [place] of [place] Phone: [phone] Relation: Son Secondary Emergency Contact: Roberts,Lynn Address: [address]          [place], [place] [address] [name] of [place] Phone: [phone] Mobile Phone: [phone] Relation: Daughter  Code Status:   Allergies: Aspirin  Chief Complaint  Patient presents with  . nursing home admission    HPI: Patient is 80 y.o. female who was hosp for acute on chronic resp failure and c. Diff colitis.  Past Medical History  Diagnosis Date  . Atrial fibrillation   . Altered mental status   . Encephalopathy   . Parkinson disease   . Hypertension   . GERD (gastroesophageal reflux disease)   . Hyperlipemia   . Diabetes mellitus   . Coronary artery disease   . History of adenomatous polyp of colon 06/24/99  . Enlarged heart   . DEMENTIA   . Edema of lower extremity 07/13/11    right leg more swollen than left leg  . Esophageal dysmotility 07/02/12  . Horseshoe kidney   . Pancreatic lesion 05/22/11    no further workup  per PCP/family due to age  . Anemia   . Peripheral neuropathy   . Depression   . Thrombophlebitis     Past Surgical History  Procedure Laterality Date  . Shoulder surgery  2001    left clavicle excision and acromioplasty  . Abdominal hysterectomy    . Cholecystectomy    . Total hip arthroplasty    . Breast surgery      2 benign tumors removed left breast  . Foot surgery      benign tumors from foot  . Esophageal dilation      several times by Dr. [name]  . Nose surgery    . Esophagogastroduodenoscopy (egd) with esophageal dilation N/A 08/01/2012    Procedure: ESOPHAGOGASTRODUODENOSCOPY (EGD) WITH ESOPHAGEAL DILATION;   Surgeon: [name], MD;  Location: [facility];  Service: Endoscopy;  Laterality: N/A;  with c-arm savory dilators  . Flexible sigmoidoscopy N/A 05/24/2013    Procedure: FLEXIBLE SIGMOIDOSCOPY;  Surgeon: [name], MD;  Location: [facility];  Service: Endoscopy;  Laterality: N/A;  . Flexible sigmoidoscopy N/A 05/26/2013    Procedure:  flex with decompression of sigmoid volvulus;  Surgeon: [name], MD;  Location: [facility];  Service: Endoscopy;  Laterality: N/A;      Medication List       This list is accurate as of: 01/06/14  9:44 PM.  Always use your most recent med list.               acetaminophen 500 MG tablet  Commonly known as:  TYLENOL  Take 1,000 mg by mouth 3 (three) times daily.     ALPRAZolam 0.25 MG tablet  Commonly known as:  XANAX  Take 1 tablet (0.25 mg total) by mouth daily as needed for anxiety.     amLODipine 2.5 MG tablet  Commonly known as:  NORVASC  Take 1 tablet (2.5 mg total) by mouth daily.     aspirin 81 MG chewable tablet  Chew 81 mg by mouth daily.     carvedilol 3.125 MG tablet  Commonly known as:  COREG  Take 3.125 mg by mouth daily.  CERTAGEN PO  Take 1 tablet by mouth daily.     cholecalciferol 1000 UNITS tablet  Commonly known as:  VITAMIN D  Take 1,000 Units by mouth daily.     clopidogrel 75 MG tablet  Commonly known as:  PLAVIX  Take 75 mg by mouth daily.     Cranberry 475 MG Caps  Take 1 capsule by mouth 2 (two) times daily.     cyclobenzaprine 5 MG tablet  Commonly known as:  FLEXERIL  Take 5 mg by mouth at bedtime.     donepezil 10 MG tablet  Commonly known as:  ARICEPT  Take 10 mg by mouth at bedtime.     dorzolamide 2 % ophthalmic solution  Commonly known as:  TRUSOPT  Place 1 drop into both eyes 3 (three) times daily.     feeding supplement (GLUCERNA SHAKE) Liqd  Take 237 mLs by mouth 2 (two) times daily between meals.     ferrous sulfate 325 (65 FE) MG tablet  Take 325 mg by mouth daily  with breakfast.     furosemide 40 MG tablet  Commonly known as:  LASIX  Take 1 tablet (40 mg total) by mouth 2 (two) times daily.     gabapentin 300 MG capsule  Commonly known as:  NEURONTIN  Take 300 mg by mouth 2 (two) times daily.     hydrALAZINE 10 MG tablet  Commonly known as:  APRESOLINE  Take 10 mg by mouth 3 (three) times daily. HOLD if SBP <100/60 and pulse is less than 60     lidocaine 5 %  Commonly known as:  LIDODERM  Place 1 patch onto the skin daily. Apply 1 patch to left lower back/hip every morning.  Remove & Discard patch within 12 hours or as directed by MD     loratadine 10 MG tablet  Commonly known as:  CLARITIN  Take 10 mg by mouth daily.     LORazepam 0.5 MG tablet  Commonly known as:  ATIVAN  Take 1 tablet (0.5 mg total) by mouth every 8 (eight) hours as needed for anxiety.     potassium chloride SA 20 MEQ tablet  Commonly known as:  K-DUR,KLOR-CON  Take 20 mEq by mouth daily.     pravastatin 20 MG tablet  Commonly known as:  PRAVACHOL  Take 20 mg by mouth at bedtime.     saccharomyces boulardii 250 MG capsule  Commonly known as:  FLORASTOR  Take 1 capsule (250 mg total) by mouth 2 (two) times daily.     senna 8.6 MG Tabs tablet  Commonly known as:  SENOKOT  Take 1 tablet (8.6 mg total) by mouth 2 (two) times daily.     sertraline 50 MG tablet  Commonly known as:  ZOLOFT  Take 50 mg by mouth daily.     sodium bicarbonate 650 MG tablet  Take 1,300 mg by mouth 3 (three) times daily.     SYSTANE 0.4-0.3 % Soln  Generic drug:  Polyethyl Glycol-Propyl Glycol  Place 1 drop into both eyes 4 (four) times daily as needed (dry eyes).     Travoprost (BAK Free) 0.004 % Soln ophthalmic solution  Commonly known as:  TRAVATAN  Place 2 drops into both eyes at bedtime.     traZODone 50 MG tablet  Commonly known as:  DESYREL  Take 100 mg by mouth at bedtime.     vancomycin 50 mg/mL oral solution  Commonly known as:  VANCOCIN  Take 10  mLs (500 mg  total) by mouth every 6 (six) hours.     vitamin B-12 500 MCG tablet  Commonly known as:  CYANOCOBALAMIN  Take 500 mcg by mouth daily.     vitamin C 500 MG tablet  Commonly known as:  ASCORBIC ACID  Take 500 mg by mouth daily.     VOLTAREN 1 % Gel  Generic drug:  diclofenac sodium  Apply 2 g topically 2 (two) times daily as needed (left neck pain).        No orders of the defined types were placed in this encounter.     There is no immunization history on file for this patient.  History  Substance Use Topics  . Smoking status: Former Smoker    Quit date: 10/09/1964  . Smokeless tobacco: Never Used  . Alcohol Use: No    Family history is noncontributory    Review of Systems  DATA OBTAINED: from patient, nurse GENERAL: Feels well no fevers, fatigue, appetite changes SKIN: No itching, rash or wounds EYES: No eye pain, redness, discharge EARS: No earache, tinnitus, change in hearing NOSE: No congestion, drainage or bleeding  MOUTH/THROAT: No mouth or tooth pain, No sore throat RESPIRATORY: No cough, wheezing, SOB CARDIAC: No chest pain, palpitations, lower extremity edema  GI: No abdominal pain, No N/V/D or constipation, No heartburn or reflux  GU: No dysuria, frequency or urgency, or incontinence  MUSCULOSKELETAL: No unrelieved bone/joint pain NEUROLOGIC: No headache, dizziness or focal weakness PSYCHIATRIC: No overt anxiety or sadness. Sleeps well. No behavior issue.   There were no vitals filed for this visit.  Physical Exam  GENERAL APPEARANCE: Alert, conversant. Appropriately groomed. No acute distress.  SKIN: No diaphoresis rash, or wounds HEAD: Normocephalic, atraumatic  EYES: Conjunctiva/lids clear. Pupils round, reactive. EOMs intact.  EARS: External exam WNL, canals clear. Hearing grossly normal.  NOSE: No deformity or discharge.  MOUTH/THROAT: Lips w/o lesions. Mouth and throat normal. Tongue moist, w/o lesion.  NECK: No thyroid tenderness,  enlargement or nodule  RESPIRATORY: Breathing is even, unlabored. Lung sounds are clear   CARDIOVASCULAR: Heart RRR no murmurs, rubs or gallops. No peripheral edema.  ARTERIAL: radial pulse 2+, DP pulse 1+  VENOUS: No varicosities. No venous stasis skin changes  GASTROINTESTINAL: Abdomen is soft, non-tender, not distended w/ normal bowel sounds. No mass, ventral or inguinal hernia. No organomegally GENITOURINARY: Bladder non tender, not distended  MUSCULOSKELETAL: No abnormal joints or musculature NEUROLOGIC: Oriented X3. Cranial nerves 2-12 grossly intact. Moves all extremities no tremor. PSYCHIATRIC: Mood and affect appropriate to situation, no behavioral issues  Patient Active Problem List   Diagnosis Date Noted  . Pleural effusion 01/06/2014  . Edema of right lower extremity 01/06/2014  . Acute diastolic CHF (congestive heart failure) 12/27/2013  . Acute on chronic respiratory failure with hypoxemia 12/27/2013  . Ileus 10/28/2013  . Enteritis due to Clostridium difficile 10/17/2013  . Hypokalemia 10/17/2013  . Malnutrition of moderate degree 10/10/2013  . Bowel obstruction 10/09/2013  . Anemia 10/09/2013  . Acute renal failure 10/04/2013  . FTT (failure to thrive) in adult 07/29/2013  . Dehydration 07/25/2013  . Palliative care encounter 07/25/2013  . Weakness generalized 07/25/2013  . Hypotension 07/24/2013  . Volvulus of sigmoid colon 05/26/2013  . Pain in right shoulder joint 05/25/2013  . Sigmoid volvulus 05/24/2013  . Pulmonary hypertension 05/24/2013  . Sacral decubitus ulcer 05/24/2013  . HOH (hard of hearing) 05/24/2013  . TIA (transient ischemic attack) 12/19/2012  . Dementia 12/19/2012  .  Insomnia 12/19/2012  . Depression 12/19/2012  . Constipation 12/19/2012  . Pancreatic mass 07/17/2011  . Dysphagia 07/17/2011  . Thrombocytopenia 07/16/2011  . E. coli UTI 07/13/2011  . History of adenomatous polyp of colon 07/02/2011  . Preventative health care 07/01/2011   . Hypertension   . GERD (gastroesophageal reflux disease)   . Hyperlipemia   . Diabetes mellitus   . Coronary artery disease   . Atrial fibrillation with controlled ventricular response 05/24/2011  . Diastolic CHF, chronic 48/54/6270    CBC    Component Value Date/Time   WBC 4.2 01/04/2014 0519   RBC 2.56* 01/04/2014 0519   RBC 2.33* 10/17/2013 0342   HGB 8.8* 01/04/2014 0519   HCT 27.3* 01/04/2014 0519   PLT 130* 01/04/2014 0519   MCV 106.6* 01/04/2014 0519   LYMPHSABS 1.0 10/17/2013 1000   MONOABS 0.6 10/17/2013 1000   EOSABS 0.2 10/17/2013 1000   BASOSABS 0.0 10/17/2013 1000    CMP     Component Value Date/Time   NA 139 01/05/2014 0432   K 4.2 01/05/2014 0432   CL 101 01/05/2014 0432   CO2 29 01/05/2014 0432   GLUCOSE 136* 01/05/2014 0432   BUN 23 01/05/2014 0432   CREATININE 1.40* 01/05/2014 0432   CALCIUM 9.1 01/05/2014 0432   PROT 6.5 01/04/2014 0519   ALBUMIN 3.2* 01/04/2014 0519   AST 53* 01/04/2014 0519   ALT 16 01/04/2014 0519   ALKPHOS 69 01/04/2014 0519   BILITOT 0.3 01/04/2014 0519   GFRNONAA 33* 01/05/2014 0432   GFRAA 38* 01/05/2014 0432    Assessment and Plan  Acute on chronic respiratory failure with hypoxemia Patient apparently has chronic respiratory failure and is on home O2. Her acute hypoxic respiratory failure secondary to acute diastolic heart failure.  -continue now lasix with adjusted dose to 40 Mg BID.  -Breathing better and at baseline now.  -Chest x ray with areas of infiltrate, but no signs of infection. Levaquin was dc'ed 7/29 due to positive C diff and limited signs/symptoms to suggest active infection..  -repeated CXR demonstrating improvement in vascular congestion and aeration. No new infiltrates  -close follow up of fluid intake, sodium ingestion and daily weights  -advise to follow low sodium diet   Pleural effusion -CT demonstrated free flow and subsequent x-ray demonstrated improvement in amount of fluid collection (suggesting  pulmonary edema   Enteritis due to  Clostridium difficile Continue oral vancomycin therapy.  -will continue probiotic/florastor and follow, clinical response   Diastolic CHF, chronic Chest x-ray does show bilateral pleural effusion and pulmonary edema, BNP is significantly elevated. No fever or leukocytosis to suggest pneumonia.  -Continue with IV lasix 40 Mg TID.  -urine out put 2.2 L. Weight 71.8 ----70.5---66    Edema of right lower extremity Bilateral Duplex negative for DVT   Hypertension Currently controlled with Coreg, and hydralazine.  -Will resume low dose norvasc  -patient will be on higher dose of lasix  -advise to follow low sodium diet   Atrial fibrillation with controlled ventricular response Rate controlled with Coreg, suspect not a anticoagulation candidate given advanced age, fall risk    Dementia condition currently At baseline.  - Continue Aricept, benzodiazepines, trazodone and Zoloft   Coronary artery disease Stable without any chest pain. Troponin negative.  - Continue antiplatelet agents, statins, beta blockers   Diabetes mellitus Diet controlled as outpatient. A1c 5.8.   Acute renal failure due to aggressive diuresis.  -Cr trending down and lasix dose adjusted  -Follow  BMET during follow up visit     Hennie Duos, MD

## 2014-01-06 NOTE — Assessment & Plan Note (Signed)
Currently controlled with Coreg, and hydralazine.  -Will resume low dose norvasc  -patient will be on higher dose of lasix  -advise to follow low sodium diet

## 2014-01-06 NOTE — Assessment & Plan Note (Signed)
Chest x-ray does show bilateral pleural effusion and pulmonary edema, BNP is significantly elevated. No fever or leukocytosis to suggest pneumonia.  -Continue with IV lasix 40 Mg TID.  -urine out put 2.2 L. Weight 71.8 ----70.5---66

## 2014-01-19 ENCOUNTER — Other Ambulatory Visit: Payer: Self-pay

## 2014-01-19 MED ORDER — ALPRAZOLAM 0.25 MG PO TABS: 0.2500 mg | ORAL_TABLET | Freq: Every day | ORAL | Status: DC | PRN

## 2014-01-19 NOTE — Telephone Encounter (Signed)
RX faxed to [facility] @ [phone], phone number [phone] 

## 2014-02-23 ENCOUNTER — Encounter: Payer: Self-pay | Admitting: Internal Medicine

## 2014-03-05 DIAGNOSIS — Z886 Allergy status to analgesic agent status: Secondary | ICD-10-CM | POA: Diagnosis not present

## 2014-03-05 DIAGNOSIS — J189 Pneumonia, unspecified organism: Secondary | ICD-10-CM | POA: Diagnosis not present

## 2014-03-05 DIAGNOSIS — M79674 Pain in right toe(s): Secondary | ICD-10-CM | POA: Diagnosis not present

## 2014-03-05 DIAGNOSIS — B351 Tinea unguium: Secondary | ICD-10-CM | POA: Diagnosis not present

## 2014-03-05 DIAGNOSIS — F0391 Unspecified dementia with behavioral disturbance: Secondary | ICD-10-CM | POA: Diagnosis not present

## 2014-03-05 DIAGNOSIS — E1122 Type 2 diabetes mellitus with diabetic chronic kidney disease: Secondary | ICD-10-CM | POA: Diagnosis present

## 2014-03-05 DIAGNOSIS — M79676 Pain in unspecified toe(s): Secondary | ICD-10-CM | POA: Diagnosis not present

## 2014-03-05 DIAGNOSIS — E1159 Type 2 diabetes mellitus with other circulatory complications: Secondary | ICD-10-CM | POA: Diagnosis not present

## 2014-03-05 DIAGNOSIS — M19041 Primary osteoarthritis, right hand: Secondary | ICD-10-CM | POA: Diagnosis not present

## 2014-03-05 DIAGNOSIS — I10 Essential (primary) hypertension: Secondary | ICD-10-CM | POA: Diagnosis not present

## 2014-03-05 DIAGNOSIS — I4891 Unspecified atrial fibrillation: Secondary | ICD-10-CM | POA: Diagnosis present

## 2014-03-05 DIAGNOSIS — D638 Anemia in other chronic diseases classified elsewhere: Secondary | ICD-10-CM | POA: Diagnosis present

## 2014-03-05 DIAGNOSIS — M19042 Primary osteoarthritis, left hand: Secondary | ICD-10-CM | POA: Diagnosis not present

## 2014-03-05 DIAGNOSIS — J9610 Chronic respiratory failure, unspecified whether with hypoxia or hypercapnia: Secondary | ICD-10-CM | POA: Diagnosis present

## 2014-03-05 DIAGNOSIS — J309 Allergic rhinitis, unspecified: Secondary | ICD-10-CM | POA: Diagnosis not present

## 2014-03-05 DIAGNOSIS — E440 Moderate protein-calorie malnutrition: Secondary | ICD-10-CM | POA: Diagnosis present

## 2014-03-05 DIAGNOSIS — Z90710 Acquired absence of both cervix and uterus: Secondary | ICD-10-CM | POA: Diagnosis not present

## 2014-03-05 DIAGNOSIS — H4011X3 Primary open-angle glaucoma, severe stage: Secondary | ICD-10-CM | POA: Diagnosis not present

## 2014-03-05 DIAGNOSIS — I129 Hypertensive chronic kidney disease with stage 1 through stage 4 chronic kidney disease, or unspecified chronic kidney disease: Secondary | ICD-10-CM | POA: Diagnosis present

## 2014-03-05 DIAGNOSIS — Z79899 Other long term (current) drug therapy: Secondary | ICD-10-CM | POA: Diagnosis not present

## 2014-03-05 DIAGNOSIS — F0390 Unspecified dementia without behavioral disturbance: Secondary | ICD-10-CM | POA: Diagnosis present

## 2014-03-05 DIAGNOSIS — J069 Acute upper respiratory infection, unspecified: Secondary | ICD-10-CM | POA: Diagnosis not present

## 2014-03-05 DIAGNOSIS — K5901 Slow transit constipation: Secondary | ICD-10-CM | POA: Diagnosis not present

## 2014-03-05 DIAGNOSIS — Z86718 Personal history of other venous thrombosis and embolism: Secondary | ICD-10-CM | POA: Diagnosis not present

## 2014-03-05 DIAGNOSIS — F419 Anxiety disorder, unspecified: Secondary | ICD-10-CM | POA: Diagnosis not present

## 2014-03-05 DIAGNOSIS — B9620 Unspecified Escherichia coli [E. coli] as the cause of diseases classified elsewhere: Secondary | ICD-10-CM | POA: Diagnosis present

## 2014-03-05 DIAGNOSIS — R1084 Generalized abdominal pain: Secondary | ICD-10-CM | POA: Diagnosis not present

## 2014-03-05 DIAGNOSIS — G20 Parkinson's disease: Secondary | ICD-10-CM | POA: Diagnosis present

## 2014-03-05 DIAGNOSIS — M7989 Other specified soft tissue disorders: Secondary | ICD-10-CM | POA: Diagnosis not present

## 2014-03-05 DIAGNOSIS — A0472 Enterocolitis due to Clostridium difficile, not specified as recurrent: Secondary | ICD-10-CM | POA: Diagnosis not present

## 2014-03-05 DIAGNOSIS — M79641 Pain in right hand: Secondary | ICD-10-CM | POA: Diagnosis not present

## 2014-03-05 DIAGNOSIS — R51 Headache: Secondary | ICD-10-CM | POA: Diagnosis present

## 2014-03-05 DIAGNOSIS — E039 Hypothyroidism, unspecified: Secondary | ICD-10-CM | POA: Diagnosis not present

## 2014-03-05 DIAGNOSIS — G218 Other secondary parkinsonism: Secondary | ICD-10-CM | POA: Diagnosis not present

## 2014-03-05 DIAGNOSIS — Z7982 Long term (current) use of aspirin: Secondary | ICD-10-CM | POA: Diagnosis not present

## 2014-03-05 DIAGNOSIS — G4700 Insomnia, unspecified: Secondary | ICD-10-CM | POA: Diagnosis not present

## 2014-03-05 DIAGNOSIS — D509 Iron deficiency anemia, unspecified: Secondary | ICD-10-CM | POA: Diagnosis present

## 2014-03-05 DIAGNOSIS — I2510 Atherosclerotic heart disease of native coronary artery without angina pectoris: Secondary | ICD-10-CM | POA: Diagnosis present

## 2014-03-05 DIAGNOSIS — E785 Hyperlipidemia, unspecified: Secondary | ICD-10-CM | POA: Diagnosis present

## 2014-03-05 DIAGNOSIS — R1110 Vomiting, unspecified: Secondary | ICD-10-CM | POA: Diagnosis not present

## 2014-03-05 DIAGNOSIS — G8929 Other chronic pain: Secondary | ICD-10-CM | POA: Diagnosis present

## 2014-03-05 DIAGNOSIS — Z87891 Personal history of nicotine dependence: Secondary | ICD-10-CM | POA: Diagnosis not present

## 2014-03-05 DIAGNOSIS — R6889 Other general symptoms and signs: Secondary | ICD-10-CM | POA: Diagnosis not present

## 2014-03-05 DIAGNOSIS — I5032 Chronic diastolic (congestive) heart failure: Secondary | ICD-10-CM | POA: Diagnosis present

## 2014-03-05 DIAGNOSIS — K529 Noninfective gastroenteritis and colitis, unspecified: Secondary | ICD-10-CM | POA: Diagnosis not present

## 2014-03-05 DIAGNOSIS — Z7902 Long term (current) use of antithrombotics/antiplatelets: Secondary | ICD-10-CM | POA: Diagnosis not present

## 2014-03-05 DIAGNOSIS — D649 Anemia, unspecified: Secondary | ICD-10-CM | POA: Diagnosis not present

## 2014-03-05 DIAGNOSIS — Z96649 Presence of unspecified artificial hip joint: Secondary | ICD-10-CM | POA: Diagnosis present

## 2014-03-05 DIAGNOSIS — F329 Major depressive disorder, single episode, unspecified: Secondary | ICD-10-CM | POA: Diagnosis present

## 2014-03-05 DIAGNOSIS — M79644 Pain in right finger(s): Secondary | ICD-10-CM | POA: Diagnosis not present

## 2014-03-05 DIAGNOSIS — K219 Gastro-esophageal reflux disease without esophagitis: Secondary | ICD-10-CM | POA: Diagnosis present

## 2014-03-05 DIAGNOSIS — Z8673 Personal history of transient ischemic attack (TIA), and cerebral infarction without residual deficits: Secondary | ICD-10-CM | POA: Diagnosis not present

## 2014-03-05 DIAGNOSIS — R109 Unspecified abdominal pain: Secondary | ICD-10-CM | POA: Diagnosis not present

## 2014-03-05 DIAGNOSIS — E876 Hypokalemia: Secondary | ICD-10-CM | POA: Diagnosis present

## 2014-03-05 DIAGNOSIS — I482 Chronic atrial fibrillation: Secondary | ICD-10-CM | POA: Diagnosis present

## 2014-03-05 DIAGNOSIS — I25119 Atherosclerotic heart disease of native coronary artery with unspecified angina pectoris: Secondary | ICD-10-CM | POA: Diagnosis not present

## 2014-03-05 DIAGNOSIS — B029 Zoster without complications: Secondary | ICD-10-CM | POA: Diagnosis not present

## 2014-03-05 DIAGNOSIS — D696 Thrombocytopenia, unspecified: Secondary | ICD-10-CM | POA: Diagnosis not present

## 2014-03-05 DIAGNOSIS — H5231 Anisometropia: Secondary | ICD-10-CM | POA: Diagnosis not present

## 2014-03-05 DIAGNOSIS — R918 Other nonspecific abnormal finding of lung field: Secondary | ICD-10-CM | POA: Diagnosis not present

## 2014-03-05 DIAGNOSIS — F411 Generalized anxiety disorder: Secondary | ICD-10-CM | POA: Diagnosis not present

## 2014-03-05 DIAGNOSIS — H4011X2 Primary open-angle glaucoma, moderate stage: Secondary | ICD-10-CM | POA: Diagnosis not present

## 2014-03-05 DIAGNOSIS — K567 Ileus, unspecified: Secondary | ICD-10-CM | POA: Diagnosis present

## 2014-03-05 DIAGNOSIS — G629 Polyneuropathy, unspecified: Secondary | ICD-10-CM | POA: Diagnosis not present

## 2014-03-05 DIAGNOSIS — H04123 Dry eye syndrome of bilateral lacrimal glands: Secondary | ICD-10-CM | POA: Diagnosis not present

## 2014-03-05 DIAGNOSIS — I517 Cardiomegaly: Secondary | ICD-10-CM | POA: Diagnosis not present

## 2014-03-05 DIAGNOSIS — N390 Urinary tract infection, site not specified: Secondary | ICD-10-CM | POA: Diagnosis present

## 2014-03-05 DIAGNOSIS — R627 Adult failure to thrive: Secondary | ICD-10-CM | POA: Diagnosis present

## 2014-03-05 DIAGNOSIS — E1142 Type 2 diabetes mellitus with diabetic polyneuropathy: Secondary | ICD-10-CM | POA: Diagnosis present

## 2014-03-05 DIAGNOSIS — E119 Type 2 diabetes mellitus without complications: Secondary | ICD-10-CM | POA: Diagnosis not present

## 2014-03-05 DIAGNOSIS — R319 Hematuria, unspecified: Secondary | ICD-10-CM | POA: Diagnosis not present

## 2014-03-05 DIAGNOSIS — I509 Heart failure, unspecified: Secondary | ICD-10-CM | POA: Diagnosis not present

## 2014-03-05 DIAGNOSIS — Z9049 Acquired absence of other specified parts of digestive tract: Secondary | ICD-10-CM | POA: Diagnosis present

## 2014-03-05 DIAGNOSIS — E46 Unspecified protein-calorie malnutrition: Secondary | ICD-10-CM | POA: Diagnosis not present

## 2014-03-05 DIAGNOSIS — H409 Unspecified glaucoma: Secondary | ICD-10-CM | POA: Diagnosis not present

## 2014-03-05 DIAGNOSIS — R140 Abdominal distension (gaseous): Secondary | ICD-10-CM | POA: Diagnosis not present

## 2014-03-05 DIAGNOSIS — K56609 Unspecified intestinal obstruction, unspecified as to partial versus complete obstruction: Secondary | ICD-10-CM | POA: Diagnosis not present

## 2014-03-05 DIAGNOSIS — R112 Nausea with vomiting, unspecified: Secondary | ICD-10-CM | POA: Diagnosis present

## 2014-03-05 DIAGNOSIS — Z9981 Dependence on supplemental oxygen: Secondary | ICD-10-CM | POA: Diagnosis not present

## 2014-03-05 DIAGNOSIS — N183 Chronic kidney disease, stage 3 (moderate): Secondary | ICD-10-CM | POA: Diagnosis present

## 2014-03-05 DIAGNOSIS — M79675 Pain in left toe(s): Secondary | ICD-10-CM | POA: Diagnosis not present

## 2014-03-18 ENCOUNTER — Non-Acute Institutional Stay (SKILLED_NURSING_FACILITY): Payer: Medicare Other | Admitting: Internal Medicine

## 2014-03-18 ENCOUNTER — Encounter: Payer: Self-pay | Admitting: Internal Medicine

## 2014-03-18 DIAGNOSIS — M19042 Primary osteoarthritis, left hand: Secondary | ICD-10-CM

## 2014-03-18 DIAGNOSIS — M19041 Primary osteoarthritis, right hand: Secondary | ICD-10-CM | POA: Diagnosis not present

## 2014-03-18 DIAGNOSIS — I4891 Unspecified atrial fibrillation: Secondary | ICD-10-CM

## 2014-03-18 NOTE — Progress Notes (Signed)
Patient ID: Maria Christian, female   DOB: 1925/05/22, 80 y.o.   MRN: [id number]  Location:  [facility] Provider:  Rexene Edison. [name], D.O., C.M.D.  Code Status:  DNR  Chief Complaint  Patient presents with  . Acute Visit    c/o her hands hurting (using voltaren gel), wants labs b/c she is worried her potassium is abnormal due to palpitations  . Medical Management of Chronic Issues    HPI:  80 yo white female long term care resident was seen for med mgt of chronic diseases and also c/o her hands hurting due to OA and palpitations.  She thinks her potassium is off b/c of the palpitations.  Denies chest pain, shortness of breath, cramps.    Review of Systems:  Review of Systems  Constitutional: Positive for malaise/fatigue. Negative for fever and chills.  Eyes: Positive for blurred vision and pain.  Respiratory: Negative for cough, shortness of breath and wheezing.   Cardiovascular: Positive for palpitations and leg swelling. Negative for chest pain, orthopnea and PND.  Gastrointestinal: Negative for abdominal pain.  Genitourinary: Negative for dysuria.  Musculoskeletal: Positive for joint pain.  Neurological: Positive for weakness.  Psychiatric/Behavioral: The patient is nervous/anxious.     Medications: Patient's Medications  New Prescriptions   No medications on file  Previous Medications   ACETAMINOPHEN (TYLENOL) 500 MG TABLET    Take 1,000 mg by mouth 3 (three) times daily.   ALPRAZOLAM (XANAX) 0.25 MG TABLET    Take 1 tablet (0.25 mg total) by mouth daily as needed for anxiety.   AMLODIPINE (NORVASC) 2.5 MG TABLET    Take 1 tablet (2.5 mg total) by mouth daily.   ASPIRIN 81 MG CHEWABLE TABLET    Chew 81 mg by mouth daily.   CARVEDILOL (COREG) 3.125 MG TABLET    Take 3.125 mg by mouth daily.   CHOLECALCIFEROL (VITAMIN D) 1000 UNITS TABLET    Take 1,000 Units by mouth daily.   CLOPIDOGREL (PLAVIX) 75 MG TABLET    Take 75 mg by mouth daily.    CRANBERRY 475 MG  CAPS    Take 1 capsule by mouth 2 (two) times daily.   CYCLOBENZAPRINE (FLEXERIL) 5 MG TABLET    Take 5 mg by mouth at bedtime.   DICLOFENAC SODIUM (VOLTAREN) 1 % GEL    Apply 2 g topically 2 (two) times daily as needed (left neck pain).    DONEPEZIL (ARICEPT) 10 MG TABLET    Take 10 mg by mouth at bedtime.   DORZOLAMIDE (TRUSOPT) 2 % OPHTHALMIC SOLUTION    Place 1 drop into both eyes 3 (three) times daily.    FEEDING SUPPLEMENT, GLUCERNA SHAKE, (GLUCERNA SHAKE) LIQD    Take 237 mLs by mouth 2 (two) times daily between meals.   FERROUS SULFATE 325 (65 FE) MG TABLET    Take 325 mg by mouth daily with breakfast.   FUROSEMIDE (LASIX) 40 MG TABLET    Take 1 tablet (40 mg total) by mouth 2 (two) times daily.   GABAPENTIN (NEURONTIN) 300 MG CAPSULE    Take 300 mg by mouth 2 (two) times daily.   HYDRALAZINE (APRESOLINE) 10 MG TABLET    Take 10 mg by mouth 3 (three) times daily. HOLD if SBP <100/60 and pulse is less than 60   LIDOCAINE (LIDODERM) 5 %    Place 1 patch onto the skin daily. Apply 1 patch to left lower back/hip every morning.  Remove & Discard patch within 12  hours or as directed by MD   LORATADINE (CLARITIN) 10 MG TABLET    Take 10 mg by mouth daily.   LORAZEPAM (ATIVAN) 0.5 MG TABLET    Take 1 tablet (0.5 mg total) by mouth every 8 (eight) hours as needed for anxiety.   MULTIPLE VITAMINS-MINERALS (CERTAGEN PO)    Take 1 tablet by mouth daily.    POLYETHYL GLYCOL-PROPYL GLYCOL (SYSTANE) 0.4-0.3 % SOLN    Place 1 drop into both eyes 4 (four) times daily as needed (dry eyes).   POTASSIUM CHLORIDE SA (K-DUR,KLOR-CON) 20 MEQ TABLET    Take 20 mEq by mouth daily.   PRAVASTATIN (PRAVACHOL) 20 MG TABLET    Take 20 mg by mouth at bedtime.    SACCHAROMYCES BOULARDII (FLORASTOR) 250 MG CAPSULE    Take 1 capsule (250 mg total) by mouth 2 (two) times daily.   SENNA (SENOKOT) 8.6 MG TABS TABLET    Take 1 tablet (8.6 mg total) by mouth 2 (two) times daily.   SERTRALINE (ZOLOFT) 50 MG TABLET    Take 50 mg  by mouth daily.    SODIUM BICARBONATE 650 MG TABLET    Take 1,300 mg by mouth 3 (three) times daily.   TRAVOPROST, BAK FREE, (TRAVATAN) 0.004 % SOLN OPHTHALMIC SOLUTION    Place 2 drops into both eyes at bedtime.    TRAZODONE (DESYREL) 50 MG TABLET    Take 100 mg by mouth at bedtime.    VITAMIN B-12 (CYANOCOBALAMIN) 500 MCG TABLET    Take 500 mcg by mouth daily.   VITAMIN C (ASCORBIC ACID) 500 MG TABLET    Take 500 mg by mouth daily.  Modified Medications   No medications on file  Discontinued Medications   No medications on file    Physical Exam: Filed Vitals:   03/18/14 1525  BP: 148/88  Pulse: 80  Temp: 97.9 F (36.6 C)  Resp: 20  Height: 5\' 8"  (1.727 m)  Weight: 145 lb (65.772 kg)  SpO2: 98%  Physical Exam  Constitutional: She appears well-developed and well-nourished. No distress.  Cardiovascular: Intact distal pulses.   irreg irreg  Pulmonary/Chest: Effort normal and breath sounds normal. She has no wheezes. She has no rales.  Abdominal: Soft. Bowel sounds are normal. She exhibits no distension. There is no tenderness.  Musculoskeletal: She exhibits tenderness.  Of joints of her hands especially first MCPs  Neurological: She is alert.  Skin: Skin is warm and dry. There is pallor.  Psychiatric:  Pleasant and conversant    Labs reviewed: Basic Metabolic Panel:  Recent Labs  10/22/13 0534  10/23/13 0438  01/03/14 0421 01/04/14 0519 01/05/14 0432  NA 144  --  141  < > 142 142 139  K 3.1*  < > 3.3*  < > 2.9* 4.3 4.2  CL 116*  --  114*  < > 100 103 101  CO2 16*  --  16*  < > 33* 28 29  GLUCOSE 129*  --  133*  < > 137* 116* 136*  BUN <3*  --  <3*  < > 18 18 23   CREATININE 1.00  --  0.93  < > 1.14* 1.18* 1.40*  CALCIUM 8.4  --  8.4  < > 9.1 9.1 9.1  MG 1.6  --  2.3  --  1.9  --   --   < > = values in this interval not displayed.  Liver Function Tests:  Recent Labs  10/09/13 1906 12/27/13 2145 01/04/14 3845  AST 20 15 53*  ALT 10 7 16   ALKPHOS 72 82 69    BILITOT 0.2* 0.3 0.3  PROT 6.1 7.6 6.5  ALBUMIN 3.1* 3.7 3.2*    CBC:  Recent Labs  07/24/13 2028  10/09/13 1906  10/17/13 1000  12/31/13 0445 01/02/14 0415 01/04/14 0519  WBC 5.0  < > 5.8  < > 5.6  < > 4.1 3.8* 4.2  NEUTROABS 3.1  --  4.2  --  3.8  --   --   --   --   HGB 8.9*  < > 7.8*  < > 8.2*  < > 8.2* 8.4* 8.8*  HCT 27.0*  < > 23.3*  < > 23.8*  < > 26.5* 26.3* 27.3*  MCV 104.2*  < > 99.6  < > 98.3  < > 108.6* 106.9* 106.6*  PLT 111*  < > 133*  < > 177  < > 130* 129* 130*  < > = values in this interval not displayed.   Assessment/Plan 1. Atrial fibrillation with controlled ventricular response -rate is controlled, but pt occasionally becomes aware of her symptoms; ? Additional PACs vs. Anxiety increasing her awareness of her usual rhythm vs. Paroxysmal not chronic afib?   -cont on asa and coreg; h/o gi bleeding -f/u bmp  2. Primary osteoarthritis of both hands -cont tylenol scheduled, voltaren gel  Family/ staff Communication: discussed with nurse Labs/tests ordered:  bmp

## 2014-03-19 LAB — BASIC METABOLIC PANEL
BUN: 25 mg/dL — AB (ref 4–21)
CREATININE: 1.3 mg/dL — AB (ref 0.5–1.1)
GLUCOSE: 122 mg/dL
POTASSIUM: 4.1 mmol/L (ref 3.4–5.3)
SODIUM: 143 mmol/L (ref 137–147)

## 2014-03-19 LAB — HEPATIC FUNCTION PANEL
ALT: 25 U/L (ref 7–35)
AST: 29 U/L (ref 13–35)
Alkaline Phosphatase: 105 U/L (ref 25–125)

## 2014-03-19 LAB — CBC AND DIFFERENTIAL
HCT: 33 % — AB (ref 36–46)
Hemoglobin: 9.9 g/dL — AB (ref 12.0–16.0)
Platelets: 121 10*3/uL — AB (ref 150–399)
WBC: 3.7 10^3/mL

## 2014-03-31 DIAGNOSIS — H4011X2 Primary open-angle glaucoma, moderate stage: Secondary | ICD-10-CM | POA: Diagnosis not present

## 2014-03-31 DIAGNOSIS — H5231 Anisometropia: Secondary | ICD-10-CM | POA: Diagnosis not present

## 2014-03-31 DIAGNOSIS — H04123 Dry eye syndrome of bilateral lacrimal glands: Secondary | ICD-10-CM | POA: Diagnosis not present

## 2014-03-31 DIAGNOSIS — E119 Type 2 diabetes mellitus without complications: Secondary | ICD-10-CM | POA: Diagnosis not present

## 2014-04-08 ENCOUNTER — Non-Acute Institutional Stay (SKILLED_NURSING_FACILITY): Payer: Medicare Other | Admitting: Internal Medicine

## 2014-04-08 ENCOUNTER — Encounter: Payer: Self-pay | Admitting: Internal Medicine

## 2014-04-08 DIAGNOSIS — M7989 Other specified soft tissue disorders: Secondary | ICD-10-CM

## 2014-04-08 NOTE — Progress Notes (Signed)
Patient ID: Maria Christian, female   DOB: 05/17/1925, 80 y.o.   MRN: [id number]  Location: [facility] Provider:  Rexene Edison. [name], D.O., C.M.D.  Code Status:  DNR  Chief Complaint  Patient presents with  . 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  When seen, she was very anxious and worried about the swelling and tells me that it was dark purple just this morning.  Her nurse tells me it never was purple, just a little bit edematous.  Review of Systems:  Review of Systems  Respiratory:       No increase in shortness of breath from her baseline  Cardiovascular: Negative for chest pain.  Musculoskeletal: Negative for joint pain and falls.       No known injury    Medications: Patient's Medications  New Prescriptions   No medications on file  Previous Medications   ACETAMINOPHEN (TYLENOL) 500 MG TABLET    Take 1,000 mg by mouth 3 (three) times daily.   ALPRAZOLAM (XANAX) 0.25 MG TABLET    Take 1 tablet (0.25 mg total) by mouth daily as needed for anxiety.   AMLODIPINE (NORVASC) 2.5 MG TABLET    Take 1 tablet (2.5 mg total) by mouth daily.   ASPIRIN 81 MG CHEWABLE TABLET    Chew 81 mg by mouth daily.   CARVEDILOL (COREG) 3.125 MG TABLET    Take 3.125 mg by mouth daily.   CHOLECALCIFEROL (VITAMIN D) 1000 UNITS TABLET    Take 1,000 Units by mouth daily.   CLOPIDOGREL (PLAVIX) 75 MG TABLET    Take 75 mg by mouth daily.    CRANBERRY 475 MG CAPS    Take 1 capsule by mouth 2 (two) times daily.   CYCLOBENZAPRINE (FLEXERIL) 5 MG TABLET    Take 5 mg by mouth at bedtime.   DICLOFENAC SODIUM (VOLTAREN) 1 % GEL    Apply 2 g topically 2 (two) times daily as needed (left neck pain).    DONEPEZIL (ARICEPT) 10 MG TABLET    Take 10 mg by mouth at bedtime.   DORZOLAMIDE (TRUSOPT) 2 % OPHTHALMIC SOLUTION    Place 1  drop into both eyes 3 (three) times daily.    FEEDING SUPPLEMENT, GLUCERNA SHAKE, (GLUCERNA SHAKE) LIQD    Take 237 mLs by mouth 2 (two) times daily between meals.   FERROUS SULFATE 325 (65 FE) MG TABLET    Take 325 mg by mouth daily with breakfast.   FUROSEMIDE (LASIX) 40 MG TABLET    Take 1 tablet (40 mg total) by mouth 2 (two) times daily.   GABAPENTIN (NEURONTIN) 300 MG CAPSULE    Take 300 mg by mouth 2 (two) times daily.   HYDRALAZINE (APRESOLINE) 10 MG TABLET    Take 10 mg by mouth 3 (three) times daily. HOLD if SBP <100/60 and pulse is less than 60   LIDOCAINE (LIDODERM) 5 %    Place 1 patch onto the skin daily. Apply 1 patch to left lower back/hip every morning.  Remove & Discard patch within 12 hours or as directed by MD   LORATADINE (CLARITIN) 10 MG TABLET    Take 10 mg by mouth daily.   LORAZEPAM (ATIVAN) 0.5 MG TABLET    Take 1 tablet (0.5 mg total)  by mouth every 8 (eight) hours as needed for anxiety.   MULTIPLE VITAMINS-MINERALS (CERTAGEN PO)    Take 1 tablet by mouth daily.    POLYETHYL GLYCOL-PROPYL GLYCOL (SYSTANE) 0.4-0.3 % SOLN    Place 1 drop into both eyes 4 (four) times daily as needed (dry eyes).   POTASSIUM CHLORIDE SA (K-DUR,KLOR-CON) 20 MEQ TABLET    Take 20 mEq by mouth daily.   PRAVASTATIN (PRAVACHOL) 20 MG TABLET    Take 20 mg by mouth at bedtime.    SACCHAROMYCES BOULARDII (FLORASTOR) 250 MG CAPSULE    Take 1 capsule (250 mg total) by mouth 2 (two) times daily.   SENNA (SENOKOT) 8.6 MG TABS TABLET    Take 1 tablet (8.6 mg total) by mouth 2 (two) times daily.   SERTRALINE (ZOLOFT) 50 MG TABLET    Take 50 mg by mouth daily.    SODIUM BICARBONATE 650 MG TABLET    Take 1,300 mg by mouth 3 (three) times daily.   TRAVOPROST, BAK FREE, (TRAVATAN) 0.004 % SOLN OPHTHALMIC SOLUTION    Place 2 drops into both eyes at bedtime.    TRAZODONE (DESYREL) 50 MG TABLET    Take 100 mg by mouth at bedtime.    VITAMIN B-12 (CYANOCOBALAMIN) 500 MCG TABLET    Take 500 mcg by mouth daily.    VITAMIN C (ASCORBIC ACID) 500 MG TABLET    Take 500 mg by mouth daily.  Modified Medications   No medications on file  Discontinued Medications   No medications on file    Physical Exam: Filed Vitals:   04/08/14 1601  BP: 147/81  Pulse: 75  Temp: 97.9 F (36.6 C)  Resp: 20  Height: 5\' 8"  (1.727 m)  Weight: 142 lb (64.411 kg)  SpO2: 98%  Physical Exam  Cardiovascular: Normal heart sounds and intact distal pulses.   Pulmonary/Chest: Effort normal and breath sounds normal.  Wearing her oxygen  Musculoskeletal:  Left upper arm is very mildly edematous distally, there are no visible ecchymoses present at this time, ROM is excellent, no tenderness  Skin: Skin is warm and dry. There is pallor.  Psychiatric:  Anxious, very pleasant lady     Labs reviewed: Basic Metabolic Panel:  Recent Labs  10/22/13 0534  10/23/13 0438  01/03/14 0421 01/04/14 0519 01/05/14 0432  NA 144  --  141  < > 142 142 139  K 3.1*  < > 3.3*  < > 2.9* 4.3 4.2  CL 116*  --  114*  < > 100 103 101  CO2 16*  --  16*  < > 33* 28 29  GLUCOSE 129*  --  133*  < > 137* 116* 136*  BUN <3*  --  <3*  < > 18 18 23   CREATININE 1.00  --  0.93  < > 1.14* 1.18* 1.40*  CALCIUM 8.4  --  8.4  < > 9.1 9.1 9.1  MG 1.6  --  2.3  --  1.9  --   --   < > = values in this interval not displayed.  Liver Function Tests:  Recent Labs  10/09/13 1906 12/27/13 2145 01/04/14 0519  AST 20 15 53*  ALT 10 7 16   ALKPHOS 72 82 69  BILITOT 0.2* 0.3 0.3  PROT 6.1 7.6 6.5  ALBUMIN 3.1* 3.7 3.2*    CBC:  Recent Labs  07/24/13 2028  10/09/13 1906  10/17/13 1000  12/31/13 0445 01/02/14 0415 01/04/14 0519  WBC 5.0  < >  5.8  < > 5.6  < > 4.1 3.8* 4.2  NEUTROABS 3.1  --  4.2  --  3.8  --   --   --   --   HGB 8.9*  < > 7.8*  < > 8.2*  < > 8.2* 8.4* 8.8*  HCT 27.0*  < > 23.3*  < > 23.8*  < > 26.5* 26.3* 27.3*  MCV 104.2*  < > 99.6  < > 98.3  < > 108.6* 106.9* 106.6*  PLT 111*  < > 133*  < > 177  < > 130* 129* 130*  < > =  values in this interval not displayed.  Assessment/Plan 1. Left arm swelling -very mild, may have improved since am, advised that it could be dependent edema -pulses are intact and there is no visible bruising -I told her we can put a warm compress or some ice on it if it will give her relief and she agreed -monitor for increased dyspnea or chest pain, but highly doubt she has a DVT in the arm due to rapid resolution of edema after being up all day.   Family/ staff Communication: discussed with her nurse  Goals of care: DNR, long term care

## 2014-04-21 DIAGNOSIS — F411 Generalized anxiety disorder: Secondary | ICD-10-CM | POA: Diagnosis not present

## 2014-04-21 DIAGNOSIS — F0391 Unspecified dementia with behavioral disturbance: Secondary | ICD-10-CM | POA: Diagnosis not present

## 2014-04-21 DIAGNOSIS — F329 Major depressive disorder, single episode, unspecified: Secondary | ICD-10-CM | POA: Diagnosis not present

## 2014-04-21 DIAGNOSIS — G4700 Insomnia, unspecified: Secondary | ICD-10-CM | POA: Diagnosis not present

## 2014-04-27 ENCOUNTER — Non-Acute Institutional Stay (SKILLED_NURSING_FACILITY): Payer: Medicare Other | Admitting: Nurse Practitioner

## 2014-04-27 DIAGNOSIS — I5032 Chronic diastolic (congestive) heart failure: Secondary | ICD-10-CM | POA: Diagnosis not present

## 2014-04-27 DIAGNOSIS — I4891 Unspecified atrial fibrillation: Secondary | ICD-10-CM

## 2014-04-27 DIAGNOSIS — K5901 Slow transit constipation: Secondary | ICD-10-CM | POA: Diagnosis not present

## 2014-04-27 DIAGNOSIS — E119 Type 2 diabetes mellitus without complications: Secondary | ICD-10-CM

## 2014-04-27 DIAGNOSIS — F32A Depression, unspecified: Secondary | ICD-10-CM

## 2014-04-27 DIAGNOSIS — D649 Anemia, unspecified: Secondary | ICD-10-CM | POA: Diagnosis not present

## 2014-04-27 DIAGNOSIS — F329 Major depressive disorder, single episode, unspecified: Secondary | ICD-10-CM | POA: Diagnosis not present

## 2014-04-27 DIAGNOSIS — I10 Essential (primary) hypertension: Secondary | ICD-10-CM | POA: Diagnosis not present

## 2014-04-27 NOTE — Progress Notes (Signed)
Patient ID: Maria Christian, female   DOB: 1924/08/29, 80 y.o.   MRN: [id number]    Nursing Home Location:  [place] of Service: SNF (31)  PCP: REED, TIFFANY, DO  Allergies  Allergen Reactions  . Aspirin Other (See Comments)    [name]    Chief Complaint  Patient presents with  . Medical Management of Chronic Issues    HPI:  Patient is a 80 y.o. female seen today at [facility] for routine follow up on chronic conditions. Pt was seen in the last month due left elbow bruising which has resolved. Otherwise pt has been stable. No changes in chronic conditions. Staff without concerns. pts only complaint is dry nose.   Review of Systems:  Review of Systems  Constitutional: Negative for activity change, appetite change, fatigue and unexpected weight change.  HENT: Positive for nosebleeds (occasionally will have nose bleeds, has dry nares from O2). Negative for congestion and hearing loss.   Eyes: Negative.   Respiratory: Negative for cough and shortness of breath.   Cardiovascular: Negative for chest pain, palpitations and leg swelling.  Gastrointestinal: Negative for abdominal pain, diarrhea and constipation.  Genitourinary: Negative for dysuria and difficulty urinating.  Musculoskeletal: Negative for myalgias and arthralgias.  Skin: Negative for color change and wound.  Neurological: Negative for dizziness and weakness.  Psychiatric/Behavioral: Positive for confusion. Negative for behavioral problems and agitation.    Past Medical History  Diagnosis Date  . Atrial fibrillation   . Altered mental status   . Encephalopathy   . Parkinson disease   . Hypertension   . GERD (gastroesophageal reflux disease)   . Hyperlipemia   . Diabetes mellitus   . Coronary artery disease   . History of adenomatous polyp of colon 06/24/99  . Enlarged heart   . DEMENTIA   . Edema of lower extremity 07/13/11    right leg more swollen than  left leg  . Esophageal dysmotility 07/02/12  . Horseshoe kidney   . Pancreatic lesion 05/22/11    no further workup  per PCP/family due to age  . Anemia   . Peripheral neuropathy   . Depression   . Thrombophlebitis    Past Surgical History  Procedure Laterality Date  . Shoulder surgery  2001    left clavicle excision and acromioplasty  . Abdominal hysterectomy    . Cholecystectomy    . Total hip arthroplasty    . Breast surgery      2 benign tumors removed left breast  . Foot surgery      benign tumors from foot  . Esophageal dilation      several times by Dr. [name]  . Nose surgery    . Esophagogastroduodenoscopy (egd) with esophageal dilation N/A 08/01/2012    Procedure: ESOPHAGOGASTRODUODENOSCOPY (EGD) WITH ESOPHAGEAL DILATION;  Surgeon: [name], MD;  Location: [facility];  Service: Endoscopy;  Laterality: N/A;  with c-arm savory dilators  . Flexible sigmoidoscopy N/A 05/24/2013    Procedure: FLEXIBLE SIGMOIDOSCOPY;  Surgeon: [name], MD;  Location: [facility];  Service: Endoscopy;  Laterality: N/A;  . Flexible sigmoidoscopy N/A 05/26/2013    Procedure:  flex with decompression of sigmoid volvulus;  Surgeon: [name], MD;  Location: [facility];  Service: Endoscopy;  Laterality: N/A;   Social History:   reports that she quit smoking about 49 years ago. She has never used smokeless tobacco. She reports that she does not  drink alcohol or use illicit drugs.  Family History  Problem Relation Age of Onset  . Cancer    . Heart disease      Medications: Patient's Medications  New Prescriptions   No medications on file  Previous Medications   ACETAMINOPHEN (TYLENOL) 500 MG TABLET    Take 1,000 mg by mouth 3 (three) times daily.   ALPRAZOLAM (XANAX) 0.25 MG TABLET    Take 1 tablet (0.25 mg total) by mouth daily as needed for anxiety.   AMLODIPINE (NORVASC) 2.5 MG TABLET    Take 1 tablet (2.5 mg total) by mouth daily.   ASPIRIN 81 MG CHEWABLE TABLET     Chew 81 mg by mouth daily.   CARVEDILOL (COREG) 3.125 MG TABLET    Take 3.125 mg by mouth daily.   CHOLECALCIFEROL (VITAMIN D) 1000 UNITS TABLET    Take 1,000 Units by mouth daily.   CLOPIDOGREL (PLAVIX) 75 MG TABLET    Take 75 mg by mouth daily.    CRANBERRY 475 MG CAPS    Take 1 capsule by mouth 2 (two) times daily.   CYCLOBENZAPRINE (FLEXERIL) 5 MG TABLET    Take 5 mg by mouth at bedtime.   DICLOFENAC SODIUM (VOLTAREN) 1 % GEL    Apply 2 g topically 2 (two) times daily as needed (left neck pain).    DONEPEZIL (ARICEPT) 10 MG TABLET    Take 10 mg by mouth at bedtime.   DORZOLAMIDE (TRUSOPT) 2 % OPHTHALMIC SOLUTION    Place 1 drop into both eyes 3 (three) times daily.    FEEDING SUPPLEMENT, GLUCERNA SHAKE, (GLUCERNA SHAKE) LIQD    Take 237 mLs by mouth 2 (two) times daily between meals.   FERROUS SULFATE 325 (65 FE) MG TABLET    Take 325 mg by mouth daily with breakfast.   FUROSEMIDE (LASIX) 40 MG TABLET    Take 1 tablet (40 mg total) by mouth 2 (two) times daily.   GABAPENTIN (NEURONTIN) 300 MG CAPSULE    Take 300 mg by mouth 2 (two) times daily.   HYDRALAZINE (APRESOLINE) 10 MG TABLET    Take 10 mg by mouth 3 (three) times daily. HOLD if SBP <100/60 and pulse is less than 60   LIDOCAINE (LIDODERM) 5 %    Place 1 patch onto the skin daily. Apply 1 patch to left lower back/hip every morning.  Remove & Discard patch within 12 hours or as directed by MD   LORATADINE (CLARITIN) 10 MG TABLET    Take 10 mg by mouth daily.   LORAZEPAM (ATIVAN) 0.5 MG TABLET    Take 1 tablet (0.5 mg total) by mouth every 8 (eight) hours as needed for anxiety.   MULTIPLE VITAMINS-MINERALS (CERTAGEN PO)    Take 1 tablet by mouth daily.    POLYETHYL GLYCOL-PROPYL GLYCOL (SYSTANE) 0.4-0.3 % SOLN    Place 1 drop into both eyes 4 (four) times daily as needed (dry eyes).   POTASSIUM CHLORIDE SA (K-DUR,KLOR-CON) 20 MEQ TABLET    Take 20 mEq by mouth daily.   PRAVASTATIN (PRAVACHOL) 20 MG TABLET    Take 20 mg by mouth at  bedtime.    SACCHAROMYCES BOULARDII (FLORASTOR) 250 MG CAPSULE    Take 1 capsule (250 mg total) by mouth 2 (two) times daily.   SENNA (SENOKOT) 8.6 MG TABS TABLET    Take 1 tablet (8.6 mg total) by mouth 2 (two) times daily.   SERTRALINE (ZOLOFT) 50 MG TABLET    Take  50 mg by mouth daily.    SODIUM BICARBONATE 650 MG TABLET    Take 1,300 mg by mouth 3 (three) times daily.   TRAVOPROST, BAK FREE, (TRAVATAN) 0.004 % SOLN OPHTHALMIC SOLUTION    Place 2 drops into both eyes at bedtime.    TRAZODONE (DESYREL) 50 MG TABLET    Take 100 mg by mouth at bedtime.    VITAMIN B-12 (CYANOCOBALAMIN) 500 MCG TABLET    Take 500 mcg by mouth daily.   VITAMIN C (ASCORBIC ACID) 500 MG TABLET    Take 500 mg by mouth daily.  Modified Medications   No medications on file  Discontinued Medications   No medications on file     Physical Exam: Filed Vitals:   04/27/14 1112  BP: 126/60  Pulse: 62  Temp: 97.2 F (36.2 C)  Resp: 20  Weight: 145 lb (65.772 kg)    Physical Exam  Constitutional: She appears well-developed and well-nourished. No distress.  HENT:  Head: Normocephalic and atraumatic.  Mouth/Throat: No oropharyngeal exudate.  Wears O2 Dry mouth  Eyes: Conjunctivae are normal. Pupils are equal, round, and reactive to light.  Neck: Normal range of motion. Neck supple.  Cardiovascular: Normal rate, regular rhythm and normal heart sounds.   Pulmonary/Chest: Effort normal and breath sounds normal.  Abdominal: Soft. Bowel sounds are normal.  Musculoskeletal: She exhibits no edema or tenderness.  Neurological: She is alert.  Skin: Skin is warm and dry. She is not diaphoretic.  Psychiatric: She has a normal mood and affect.    Labs reviewed: Basic Metabolic Panel:  Recent Labs  10/22/13 0534  10/23/13 0438  01/03/14 0421 01/04/14 0519 01/05/14 0432 03/19/14  NA 144  --  141  < > 142 142 139 143  K 3.1*  < > 3.3*  < > 2.9* 4.3 4.2 4.1  CL 116*  --  114*  < > 100 103 101  --   CO2 16*  --   16*  < > 33* 28 29  --   GLUCOSE 129*  --  133*  < > 137* 116* 136*  --   BUN <3*  --  <3*  < > 18 18 23  25*  CREATININE 1.00  --  0.93  < > 1.14* 1.18* 1.40* 1.3*  CALCIUM 8.4  --  8.4  < > 9.1 9.1 9.1  --   MG 1.6  --  2.3  --  1.9  --   --   --   < > = values in this interval not displayed. Liver Function Tests:  Recent Labs  10/09/13 1906 12/27/13 2145 01/04/14 0519 03/19/14  AST 20 15 53* 29  ALT 10 7 16 25   ALKPHOS 72 82 69 105  BILITOT 0.2* 0.3 0.3  --   PROT 6.1 7.6 6.5  --   ALBUMIN 3.1* 3.7 3.2*  --     Recent Labs  07/24/13 0422 10/09/13 1906 12/27/13 2145  LIPASE 31 19 18    No results for input(s): AMMONIA in the last 8760 hours. CBC:  Recent Labs  07/24/13 2028  10/09/13 1906  10/17/13 1000  12/31/13 0445 01/02/14 0415 01/04/14 0519 03/19/14  WBC 5.0  < > 5.8  < > 5.6  < > 4.1 3.8* 4.2 3.7  NEUTROABS 3.1  --  4.2  --  3.8  --   --   --   --   --   HGB 8.9*  < > 7.8*  < > 8.2*  < >  8.2* 8.4* 8.8* 9.9*  HCT 27.0*  < > 23.3*  < > 23.8*  < > 26.5* 26.3* 27.3* 33*  MCV 104.2*  < > 99.6  < > 98.3  < > 108.6* 106.9* 106.6*  --   PLT 111*  < > 133*  < > 177  < > 130* 129* 130* 121*  < > = values in this interval not displayed. TSH:  Recent Labs  07/24/13 2028  TSH 2.363   A1C: Lab Results  Component Value Date   HGBA1C 5.8* 12/28/2013   Lipid Panel: No results for input(s): CHOL, HDL, LDLCALC, TRIG, CHOLHDL, LDLDIRECT in the last 8760 hours.   Assessment/Plan 1. Diastolic CHF, chronic -weights daily, reports 1 lb weight gain from yesterday but no signs of fluid overload. conts on lasix  -remains on o2, staff to add saline to help with humidity, will have staff use plain saline nasal spray to nares PRN    2. Essential hypertension -Patients blood pressure is stable; continue current regimen. Will monitor and make changes as necessary.  3. Atrial fibrillation with controlled ventricular response Rate controlled on current medications,  remains off anticoagulation due to fall risk and age  62. Slow transit constipation Controlled with current regimen, conts on senna BID   5. Type 2 diabetes mellitus without complication I6N well controlled in July  6. Depression Stable, conts on zoloft and trazodone   7. Anemia, unspecified anemia type -hgb stable on last labs, conts on Iron

## 2014-06-09 ENCOUNTER — Non-Acute Institutional Stay (SKILLED_NURSING_FACILITY): Payer: Medicare Other | Admitting: Adult Health

## 2014-06-09 ENCOUNTER — Encounter: Payer: Self-pay | Admitting: Adult Health

## 2014-06-09 DIAGNOSIS — J9610 Chronic respiratory failure, unspecified whether with hypoxia or hypercapnia: Secondary | ICD-10-CM | POA: Insufficient documentation

## 2014-06-09 DIAGNOSIS — E785 Hyperlipidemia, unspecified: Secondary | ICD-10-CM | POA: Diagnosis not present

## 2014-06-09 DIAGNOSIS — F329 Major depressive disorder, single episode, unspecified: Secondary | ICD-10-CM | POA: Diagnosis not present

## 2014-06-09 DIAGNOSIS — G8929 Other chronic pain: Secondary | ICD-10-CM | POA: Insufficient documentation

## 2014-06-09 DIAGNOSIS — I10 Essential (primary) hypertension: Secondary | ICD-10-CM | POA: Diagnosis not present

## 2014-06-09 DIAGNOSIS — D649 Anemia, unspecified: Secondary | ICD-10-CM

## 2014-06-09 DIAGNOSIS — I4891 Unspecified atrial fibrillation: Secondary | ICD-10-CM | POA: Diagnosis not present

## 2014-06-09 DIAGNOSIS — H409 Unspecified glaucoma: Secondary | ICD-10-CM | POA: Diagnosis not present

## 2014-06-09 DIAGNOSIS — F32A Depression, unspecified: Secondary | ICD-10-CM

## 2014-06-09 DIAGNOSIS — F0390 Unspecified dementia without behavioral disturbance: Secondary | ICD-10-CM | POA: Diagnosis not present

## 2014-06-09 DIAGNOSIS — K5901 Slow transit constipation: Secondary | ICD-10-CM

## 2014-06-09 DIAGNOSIS — I5032 Chronic diastolic (congestive) heart failure: Secondary | ICD-10-CM

## 2014-06-09 NOTE — Progress Notes (Signed)
Patient ID: Maria Christian, female   DOB: 08-08-1924, 81 y.o.   MRN: [id number]  [facility]     Allergies  Allergen Reactions  . Aspirin Other (See Comments)    [name] only       Chief Complaint  Patient presents with  . Medical Management of Chronic Issues    HPI:  She is a long term resident of this facility being seen for the management of her chronic illnesses. Overall her status is without change. She is not voicing any complaints or concerns today. She states that she is feeling good today. There are no nursing concerns being voiced today.     Past Medical History  Diagnosis Date  . Atrial fibrillation   . Altered mental status   . Encephalopathy   . Parkinson disease   . Hypertension   . GERD (gastroesophageal reflux disease)   . Hyperlipemia   . Diabetes mellitus   . Coronary artery disease   . History of adenomatous polyp of colon 06/24/99  . Enlarged heart   . DEMENTIA   . Edema of lower extremity 07/13/11    right leg more swollen than left leg  . Esophageal dysmotility 07/02/12  . Horseshoe kidney   . Pancreatic lesion 05/22/11    no further workup  per PCP/family due to age  . Anemia   . Peripheral neuropathy   . Depression   . Thrombophlebitis     Past Surgical History  Procedure Laterality Date  . Shoulder surgery  2001    left clavicle excision and acromioplasty  . Abdominal hysterectomy    . Cholecystectomy    . Total hip arthroplasty    . Breast surgery      2 benign tumors removed left breast  . Foot surgery      benign tumors from foot  . Esophageal dilation      several times by Dr. [name]  . Nose surgery    . Esophagogastroduodenoscopy (egd) with esophageal dilation N/A 08/01/2012    Procedure: ESOPHAGOGASTRODUODENOSCOPY (EGD) WITH ESOPHAGEAL DILATION;  Surgeon: [name], MD;  Location: [facility];  Service: Endoscopy;  Laterality: N/A;  with c-arm savory dilators  . Flexible sigmoidoscopy N/A 05/24/2013   Procedure: FLEXIBLE SIGMOIDOSCOPY;  Surgeon: [name], MD;  Location: [facility];  Service: Endoscopy;  Laterality: N/A;  . Flexible sigmoidoscopy N/A 05/26/2013    Procedure:  flex with decompression of sigmoid volvulus;  Surgeon: [name], MD;  Location: [facility];  Service: Endoscopy;  Laterality: N/A;    VITAL SIGNS BP 122/76 mmHg  Pulse 78  Ht 5\' 9"  (1.753 m)  Wt 147 lb (66.679 kg)  BMI 21.70 kg/m2  SpO2 96%   Outpatient Encounter Prescriptions as of 06/09/2014  Medication Sig  . acetaminophen (TYLENOL) 500 MG tablet Take 1,000 mg by mouth 3 (three) times daily.  Marland Kitchen amLODipine (NORVASC) 2.5 MG tablet Take 1 tablet (2.5 mg total) by mouth daily.  Marland Kitchen aspirin 81 MG chewable tablet Chew 81 mg by mouth daily.  . carvedilol (COREG) 3.125 MG tablet Take 3.125 mg by mouth daily.  . cholecalciferol (VITAMIN D) 1000 UNITS tablet Take 1,000 Units by mouth daily.  . clopidogrel (PLAVIX) 75 MG tablet Take 75 mg by mouth daily.   . Cranberry 475 MG CAPS Take 1 capsule by mouth 2 (two) times daily.  . cyclobenzaprine (FLEXERIL) 5 MG tablet Take 5 mg by mouth at bedtime.  . diclofenac sodium (VOLTAREN) 1 % GEL  Apply 2 g topically 2 (two) times daily as needed (left neck pain).   Marland Kitchen donepezil (ARICEPT) 10 MG tablet Take 10 mg by mouth at bedtime.  . dorzolamide (TRUSOPT) 2 % ophthalmic solution Place 1 drop into both eyes 3 (three) times daily.   . ferrous sulfate 325 (65 FE) MG tablet Take 325 mg by mouth daily with breakfast.  . furosemide (LASIX) 40 MG tablet Take 1 tablet (40 mg total) by mouth 2 (two) times daily.  Marland Kitchen gabapentin (NEURONTIN) 300 MG capsule Take 300 mg by mouth 2 (two) times daily.  . hydrALAZINE (APRESOLINE) 10 MG tablet Take 10 mg by mouth 3 (three) times daily. HOLD if SBP <100/60 and pulse is less than 60  . lidocaine (LIDODERM) 5 % Place 1 patch onto the skin daily. Apply 1 patch to left lower back/hip every morning.  Remove & Discard patch within 12 hours or as  directed by MD  . loratadine (CLARITIN) 10 MG tablet Take 10 mg by mouth daily.  . Multiple Vitamins-Minerals (CERTAGEN PO) Take 1 tablet by mouth daily.   . NON FORMULARY Take 90 mLs by mouth 2 (two) times daily. 2 cal supplement  . Polyethyl Glycol-Propyl Glycol (SYSTANE) 0.4-0.3 % SOLN Place 1 drop into both eyes 4 (four) times daily as needed (dry eyes).  . potassium chloride SA (K-DUR,KLOR-CON) 20 MEQ tablet Take 20 mEq by mouth daily.  . pravastatin (PRAVACHOL) 20 MG tablet Take 20 mg by mouth at bedtime.   . saccharomyces boulardii (FLORASTOR) 250 MG capsule Take 1 capsule (250 mg total) by mouth 2 (two) times daily.  Marland Kitchen senna (SENOKOT) 8.6 MG TABS tablet Take 1 tablet (8.6 mg total) by mouth 2 (two) times daily.  . sertraline (ZOLOFT) 50 MG tablet Take 50 mg by mouth daily.   . sodium bicarbonate 650 MG tablet Take 1,300 mg by mouth 3 (three) times daily.  . Travoprost, BAK Free, (TRAVATAN) 0.004 % SOLN ophthalmic solution Place 2 drops into both eyes at bedtime.   . traZODone (DESYREL) 50 MG tablet Take 100 mg by mouth at bedtime.   . vitamin B-12 (CYANOCOBALAMIN) 500 MCG tablet Take 500 mcg by mouth daily.  . vitamin C (ASCORBIC ACID) 500 MG tablet Take 500 mg by mouth daily.  . [DISCONTINUED] ALPRAZolam (XANAX) 0.25 MG tablet Take 1 tablet (0.25 mg total) by mouth daily as needed for anxiety. (Patient not taking: Reported on 06/09/2014)  . [DISCONTINUED] feeding supplement, GLUCERNA SHAKE, (GLUCERNA SHAKE) LIQD Take 237 mLs by mouth 2 (two) times daily between meals. (Patient not taking: Reported on 06/09/2014)  . [DISCONTINUED] LORazepam (ATIVAN) 0.5 MG tablet Take 1 tablet (0.5 mg total) by mouth every 8 (eight) hours as needed for anxiety. (Patient not taking: Reported on 06/09/2014)     SIGNIFICANT DIAGNOSTIC EXAMS    LABS REVIEWED:   12-11-13: hgb a1c 5.6  03-19-14: wbc 3.7; hgb 9.9; hct 33.2; mcv 108.5; plt 121; glucose 122; bun 25.7; creat 1.3; k+4.1; na++139; liver normal  albumin 4.3    Review of Systems  Constitutional: Negative for malaise/fatigue.  Respiratory: Negative for cough and shortness of breath.   Cardiovascular: Positive for palpitations. Negative for chest pain and leg swelling.       Had one episode after a "bad" dream  Gastrointestinal: Negative for heartburn, abdominal pain and constipation.  Musculoskeletal: Positive for neck pain. Negative for myalgias and joint pain.       Pain is presently managed   Skin: Negative.  Neurological: Negative for headaches.  Psychiatric/Behavioral: Negative for depression. The patient is not nervous/anxious.      Physical Exam  Constitutional: She appears well-developed and well-nourished. No distress.  Neck: Neck supple. No JVD present. No thyromegaly present.  Cardiovascular: Normal rate and intact distal pulses.   Heart rate irregular   Respiratory: Effort normal and breath sounds normal. No respiratory distress.  GI: Soft. Bowel sounds are normal. There is no tenderness.  Musculoskeletal: She exhibits no edema.  Is able to move all extremities   Neurological: She is alert.  Skin: Skin is warm and dry. She is not diaphoretic.       ASSESSMENT/ PLAN:  1. Chronic diastolic heart failure: is presently stable will continue daily weights; will continue lasix 40 mg twice daily with k+ 20 meq daily hydralazine 10 mg three times daily coreg 3.125 mg daily and will monitor her status.   2. Afib: her heart rate is stable will continue coreg 3.125 mg daily for rate control; wll continue plavix 75 mg daily and asa 81 mg daily will monitor  3. Hypertension: is stable will continue norvasc 2.5 mg daily; coreg 3.125 mg daily; hydralazine 10 mg three times daily;  Will monitor   4. Chronic respiratory failure: is stable on chronic 02; will continue sodium bicarb 1300 mg three times daily for C02 balance   5. Anemia: hgb is 9.9; will continue iron daily   6. Dyslipidemia: will continue pravachol 20 mg  daily will check lipids  7. Allergic rhinitis: will continue claritin daily   8. Constipation: will continue senna twice daily   9. Depression: is stable; is followed by [facility]; receives benefit from zoloft 50 mg daily and trazodone 50 mg nightly for sleep will monitor   10. Dementia: is without change will continue aricept 10 mg daily will monitor   11.  Chronic pain due to osteoarthritis: her pain is presently managed on her current regimen: flexeril 5 mg nightly; voltaren gel 2 gm to neck twice daily as needed;  neurontin 300 mg twice daily; lidoderm to lower back; tylenol 1 gm three times daily will monitor   12. Glaucoma: will continue travoprost to both eyes nightly; trusopt to both eyes three times daily   Ok Edwards NP [facility]  Contact [phone] Monday through Friday 8am- 5pm  After hours call [phone]

## 2014-06-12 LAB — LIPID PANEL
CHOLESTEROL: 137 mg/dL (ref 0–200)
HDL: 53 mg/dL (ref 35–70)
LDL Cholesterol: 68 mg/dL
TRIGLYCERIDES: 83 mg/dL (ref 40–160)

## 2014-06-17 ENCOUNTER — Non-Acute Institutional Stay (SKILLED_NURSING_FACILITY): Payer: Medicare Other | Admitting: Adult Health

## 2014-06-17 DIAGNOSIS — B9620 Unspecified Escherichia coli [E. coli] as the cause of diseases classified elsewhere: Secondary | ICD-10-CM

## 2014-06-17 DIAGNOSIS — N390 Urinary tract infection, site not specified: Secondary | ICD-10-CM | POA: Diagnosis not present

## 2014-07-14 NOTE — Progress Notes (Signed)
Patient ID: Maria Christian, female   DOB: September 22, 1924, 81 y.o.   MRN: [id number]  [facility]     Allergies  Allergen Reactions  . Aspirin Other (See Comments)    [name]       Chief Complaint  Patient presents with  . Acute Visit    uti    HPI:  She has a positive urine culture demonstrating an uti with e-coli and esbl. She is having increased fatigue and dysuria. There are no reports of fever present. Her fluid intake is slightly diminished.    Past Medical History  Diagnosis Date  . Atrial fibrillation   . Altered mental status   . Encephalopathy   . Parkinson disease   . Hypertension   . GERD (gastroesophageal reflux disease)   . Hyperlipemia   . Diabetes mellitus   . Coronary artery disease   . History of adenomatous polyp of colon 06/24/99  . Enlarged heart   . DEMENTIA   . Edema of lower extremity 07/13/11    right leg more swollen than left leg  . Esophageal dysmotility 07/02/12  . Horseshoe kidney   . Pancreatic lesion 05/22/11    no further workup  per PCP/family due to age  . Anemia   . Peripheral neuropathy   . Depression   . Thrombophlebitis     Past Surgical History  Procedure Laterality Date  . Shoulder surgery  2001    left clavicle excision and acromioplasty  . Abdominal hysterectomy    . Cholecystectomy    . Total hip arthroplasty    . Breast surgery      2 benign tumors removed left breast  . Foot surgery      benign tumors from foot  . Esophageal dilation      several times by Dr. [name]  . Nose surgery    . Esophagogastroduodenoscopy (egd) with esophageal dilation N/A 08/01/2012    Procedure: ESOPHAGOGASTRODUODENOSCOPY (EGD) WITH ESOPHAGEAL DILATION;  Surgeon: [name], MD;  Location: [facility];  Service: Endoscopy;  Laterality: N/A;  with c-arm savory dilators  . Flexible sigmoidoscopy N/A 05/24/2013    Procedure: FLEXIBLE SIGMOIDOSCOPY;  Surgeon: [name], MD;  Location: [facility];  Service: Endoscopy;   Laterality: N/A;  . Flexible sigmoidoscopy N/A 05/26/2013    Procedure:  flex with decompression of sigmoid volvulus;  Surgeon: [name], MD;  Location: [facility];  Service: Endoscopy;  Laterality: N/A;    VITAL SIGNS BP 121/70 mmHg  Pulse 77  Ht 5\' 8"  (1.727 m)  Wt 146 lb (66.225 kg)  BMI 22.20 kg/m2  SpO2 97%   Outpatient Encounter Prescriptions as of 06/17/2014  Medication Sig  . acetaminophen (TYLENOL) 500 MG tablet Take 1,000 mg by mouth 3 (three) times daily.  Marland Kitchen amLODipine (NORVASC) 2.5 MG tablet Take 1 tablet (2.5 mg total) by mouth daily.  Marland Kitchen aspirin 81 MG chewable tablet Chew 81 mg by mouth daily.  . carvedilol (COREG) 3.125 MG tablet Take 3.125 mg by mouth daily.  . cholecalciferol (VITAMIN D) 1000 UNITS tablet Take 1,000 Units by mouth daily.  . clopidogrel (PLAVIX) 75 MG tablet Take 75 mg by mouth daily.   . Cranberry 475 MG CAPS Take 1 capsule by mouth 2 (two) times daily.  . cyclobenzaprine (FLEXERIL) 5 MG tablet Take 5 mg by mouth at bedtime.  . diclofenac sodium (VOLTAREN) 1 % GEL Apply 2 g topically 2 (two) times daily as needed (left neck pain).   Marland Kitchen  donepezil (ARICEPT) 10 MG tablet Take 10 mg by mouth at bedtime.  . dorzolamide (TRUSOPT) 2 % ophthalmic solution Place 1 drop into both eyes 3 (three) times daily.   . ferrous sulfate 325 (65 FE) MG tablet Take 325 mg by mouth daily with breakfast.  . furosemide (LASIX) 40 MG tablet Take 1 tablet (40 mg total) by mouth 2 (two) times daily.  Marland Kitchen gabapentin (NEURONTIN) 300 MG capsule Take 300 mg by mouth 2 (two) times daily.  . hydrALAZINE (APRESOLINE) 10 MG tablet Take 10 mg by mouth 3 (three) times daily. HOLD if SBP <100/60 and pulse is less than 60  . lidocaine (LIDODERM) 5 % Place 1 patch onto the skin daily. Apply 1 patch to left lower back/hip every morning.  Remove & Discard patch within 12 hours or as directed by MD  . loratadine (CLARITIN) 10 MG tablet Take 10 mg by mouth daily.  . Multiple  Vitamins-Minerals (CERTAGEN PO) Take 1 tablet by mouth daily.   . NON FORMULARY Take 90 mLs by mouth 2 (two) times daily. 2 cal supplement  . Polyethyl Glycol-Propyl Glycol (SYSTANE) 0.4-0.3 % SOLN Place 1 drop into both eyes 4 (four) times daily as needed (dry eyes).  . potassium chloride SA (K-DUR,KLOR-CON) 20 MEQ tablet Take 20 mEq by mouth daily.  . pravastatin (PRAVACHOL) 20 MG tablet Take 20 mg by mouth at bedtime.   . saccharomyces boulardii (FLORASTOR) 250 MG capsule Take 1 capsule (250 mg total) by mouth 2 (two) times daily.  Marland Kitchen senna (SENOKOT) 8.6 MG TABS tablet Take 1 tablet (8.6 mg total) by mouth 2 (two) times daily.  . sertraline (ZOLOFT) 50 MG tablet Take 50 mg by mouth daily.   . sodium bicarbonate 650 MG tablet Take 1,300 mg by mouth 3 (three) times daily.  . Travoprost, BAK Free, (TRAVATAN) 0.004 % SOLN ophthalmic solution Place 2 drops into both eyes at bedtime.   . traZODone (DESYREL) 50 MG tablet Take 100 mg by mouth at bedtime.   . vitamin B-12 (CYANOCOBALAMIN) 500 MCG tablet Take 500 mcg by mouth daily.  . vitamin C (ASCORBIC ACID) 500 MG tablet Take 500 mg by mouth daily.     SIGNIFICANT DIAGNOSTIC EXAMS   LABS REVIEWED:   12-11-13: hgb a1c 5.6  03-19-14: wbc 3.7; hgb 9.9; hct 33.2; mcv 108.5; plt 121; glucose 122; bun 25.7; creat 1.3; k+4.1; na++139; liver normal albumin 4.3 06-15-14: urine culture: e-coli esbl: cipro       Review of Systems  Constitutional: Positive for malaise/fatigue.  Respiratory: Negative for cough and shortness of breath.   Cardiovascular: Negative for chest pain and palpitations.  Gastrointestinal: Positive for abdominal pain. Negative for heartburn.       Suprapubic pain  Genitourinary: Positive for dysuria.  Musculoskeletal: Negative for myalgias and joint pain.  Skin: Negative.   Psychiatric/Behavioral: The patient is not nervous/anxious.      Physical Exam Constitutional: She appears well-developed and well-nourished. No  distress.  Neck: Neck supple. No JVD present. No thyromegaly present.  Cardiovascular: Normal rate and intact distal pulses.   Heart rate irregular   Respiratory: Effort normal and breath sounds normal. No respiratory distress.  GI: Soft. Bowel sounds are normal. There is no tenderness.  Musculoskeletal: She exhibits no edema.  Is able to move all extremities   Neurological: She is alert.  Skin: Skin is warm and dry. She is not diaphoretic.     ASSESSMENT/ PLAN:  1. UTI: will begin cipro 500 mg  twice daily for 10 days with florastor twice daily for 2 weeks and will monitor her status     Ok Edwards NP [facility]  Contact [phone] Monday through Friday 8am- 5pm  After hours call [phone]

## 2014-07-17 LAB — CBC AND DIFFERENTIAL
HEMATOCRIT: 31 % — AB (ref 36–46)
HEMOGLOBIN: 9.7 g/dL — AB (ref 12.0–16.0)
Platelets: 113 10*3/uL — AB (ref 150–399)
WBC: 3.8 10^3/mL

## 2014-07-17 LAB — HEMOGLOBIN A1C: Hgb A1c MFr Bld: 6.1 % — AB (ref 4.0–6.0)

## 2014-08-02 ENCOUNTER — Non-Acute Institutional Stay (SKILLED_NURSING_FACILITY): Payer: Medicare Other | Admitting: Internal Medicine

## 2014-08-02 DIAGNOSIS — E119 Type 2 diabetes mellitus without complications: Secondary | ICD-10-CM

## 2014-08-02 DIAGNOSIS — I4891 Unspecified atrial fibrillation: Secondary | ICD-10-CM | POA: Diagnosis not present

## 2014-08-02 DIAGNOSIS — Z0000 Encounter for general adult medical examination without abnormal findings: Secondary | ICD-10-CM

## 2014-08-02 DIAGNOSIS — M19042 Primary osteoarthritis, left hand: Secondary | ICD-10-CM | POA: Diagnosis not present

## 2014-08-02 DIAGNOSIS — M19041 Primary osteoarthritis, right hand: Secondary | ICD-10-CM

## 2014-08-02 DIAGNOSIS — I10 Essential (primary) hypertension: Secondary | ICD-10-CM

## 2014-08-02 DIAGNOSIS — D649 Anemia, unspecified: Secondary | ICD-10-CM

## 2014-08-02 DIAGNOSIS — M79644 Pain in right finger(s): Secondary | ICD-10-CM

## 2014-08-02 DIAGNOSIS — G8929 Other chronic pain: Secondary | ICD-10-CM | POA: Diagnosis not present

## 2014-08-02 DIAGNOSIS — F0390 Unspecified dementia without behavioral disturbance: Secondary | ICD-10-CM | POA: Diagnosis not present

## 2014-08-07 ENCOUNTER — Encounter: Payer: Self-pay | Admitting: Internal Medicine

## 2014-08-07 NOTE — Progress Notes (Signed)
Patient ID: Maria Christian, female   DOB: 08-Apr-1925, 81 y.o.   MRN: [id number]    Facility  [facility]    Place of Service:   SNF   Allergies  Allergen Reactions  . Aspirin Other (See Comments)    [name]    Chief Complaint  Patient presents with  . Annual Exam    HPI:  81 yo female long term resident seen today for comprehensive exam. She c/o right index finger pain that prevents her from making a fist. No known injury. No other c/o. She states she is eating and sleeping. No nursing issues. Pt is a poor historian due to dementia. She takes aricept.  She overall has been stable. afib is controlled. She has chronic pain due to arthritis an is using lidoderm ptch, flexeril, volteran gel, tylenol and gabapentin. DM is diet controlled. CBGs not checked. Depression is controlled on sertraline and trazodone. No recent heart failure exacerbations on current tx.  Past Medical History  Diagnosis Date  . Atrial fibrillation   . Altered mental status   . Encephalopathy   . Parkinson disease   . Hypertension   . GERD (gastroesophageal reflux disease)   . Hyperlipemia   . Diabetes mellitus   . Coronary artery disease   . History of adenomatous polyp of colon 06/24/99  . Enlarged heart   . DEMENTIA   . Edema of lower extremity 07/13/11    right leg more swollen than left leg  . Esophageal dysmotility 07/02/12  . Horseshoe kidney   . Pancreatic lesion 05/22/11    no further workup  per PCP/family due to age  . Anemia   . Peripheral neuropathy   . Depression   . Thrombophlebitis    Past Surgical History  Procedure Laterality Date  . Shoulder surgery  2001    left clavicle excision and acromioplasty  . Abdominal hysterectomy    . Cholecystectomy    . Total hip arthroplasty    . Breast surgery      2 benign tumors removed left breast  . Foot surgery      benign tumors from foot  . Esophageal dilation      several times by Dr. [name]  . Nose surgery      . Esophagogastroduodenoscopy (egd) with esophageal dilation N/A 08/01/2012    Procedure: ESOPHAGOGASTRODUODENOSCOPY (EGD) WITH ESOPHAGEAL DILATION;  Surgeon: [name], MD;  Location: [facility];  Service: Endoscopy;  Laterality: N/A;  with c-arm savory dilators  . Flexible sigmoidoscopy N/A 05/24/2013    Procedure: FLEXIBLE SIGMOIDOSCOPY;  Surgeon: [name], MD;  Location: [facility];  Service: Endoscopy;  Laterality: N/A;  . Flexible sigmoidoscopy N/A 05/26/2013    Procedure:  flex with decompression of sigmoid volvulus;  Surgeon: [name], MD;  Location: [facility];  Service: Endoscopy;  Laterality: N/A;   History   Social History  . Marital Status: Widowed    Spouse Name: N/A  . Number of Children: 5  . Years of Education: 10th   Occupational History  . Retired    Social History Main Topics  . Smoking status: Former Smoker    Quit date: 10/09/1964  . Smokeless tobacco: Never Used  . Alcohol Use: No  . Drug Use: No  . Sexual Activity: No   Other Topics Concern  . Not on file   Social History Narrative   Pt lives at [facility] and [place].   Caffeine Use: very  small amount daily   Family History  Problem Relation Age of Onset  . Cancer    . Heart disease     Past medical, surgical, family and social history reviewed   Medications: Patient's Medications  New Prescriptions   No medications on file  Previous Medications   ACETAMINOPHEN (TYLENOL) 500 MG TABLET    Take 1,000 mg by mouth 3 (three) times daily.   AMLODIPINE (NORVASC) 2.5 MG TABLET    Take 1 tablet (2.5 mg total) by mouth daily.   ASPIRIN 81 MG CHEWABLE TABLET    Chew 81 mg by mouth daily.   CARVEDILOL (COREG) 3.125 MG TABLET    Take 3.125 mg by mouth daily.   CHOLECALCIFEROL (VITAMIN D) 1000 UNITS TABLET    Take 1,000 Units by mouth daily.   CLOPIDOGREL (PLAVIX) 75 MG TABLET    Take 75 mg by mouth daily.    CRANBERRY 475 MG CAPS    Take 1 capsule by mouth 2 (two) times  daily.   CYCLOBENZAPRINE (FLEXERIL) 5 MG TABLET    Take 5 mg by mouth at bedtime.   DICLOFENAC SODIUM (VOLTAREN) 1 % GEL    Apply 2 g topically 2 (two) times daily as needed (left neck pain).    DONEPEZIL (ARICEPT) 10 MG TABLET    Take 10 mg by mouth at bedtime.   DORZOLAMIDE (TRUSOPT) 2 % OPHTHALMIC SOLUTION    Place 1 drop into both eyes 3 (three) times daily.    FERROUS SULFATE 325 (65 FE) MG TABLET    Take 325 mg by mouth daily with breakfast.   FUROSEMIDE (LASIX) 40 MG TABLET    Take 1 tablet (40 mg total) by mouth 2 (two) times daily.   GABAPENTIN (NEURONTIN) 300 MG CAPSULE    Take 300 mg by mouth 2 (two) times daily.   HYDRALAZINE (APRESOLINE) 10 MG TABLET    Take 10 mg by mouth 3 (three) times daily. HOLD if SBP <100/60 and pulse is less than 60   LIDOCAINE (LIDODERM) 5 %    Place 1 patch onto the skin daily. Apply 1 patch to left lower back/hip every morning.  Remove & Discard patch within 12 hours or as directed by MD   LORATADINE (CLARITIN) 10 MG TABLET    Take 10 mg by mouth daily.   MULTIPLE VITAMINS-MINERALS (CERTAGEN PO)    Take 1 tablet by mouth daily.    NON FORMULARY    Take 90 mLs by mouth 2 (two) times daily. 2 cal supplement   POLYETHYL GLYCOL-PROPYL GLYCOL (SYSTANE) 0.4-0.3 % SOLN    Place 1 drop into both eyes 4 (four) times daily as needed (dry eyes).   POTASSIUM CHLORIDE SA (K-DUR,KLOR-CON) 20 MEQ TABLET    Take 20 mEq by mouth daily.   PRAVASTATIN (PRAVACHOL) 20 MG TABLET    Take 20 mg by mouth at bedtime.    SENNA (SENOKOT) 8.6 MG TABS TABLET    Take 1 tablet (8.6 mg total) by mouth 2 (two) times daily.   SERTRALINE (ZOLOFT) 50 MG TABLET    Take 50 mg by mouth daily.    SODIUM BICARBONATE 650 MG TABLET    Take 1,300 mg by mouth 3 (three) times daily.   SODIUM CHLORIDE (OCEAN) 0.65 % SOLN NASAL SPRAY    Place 1 spray into both nostrils as needed for congestion (dryness).   TRAVOPROST, BAK FREE, (TRAVATAN) 0.004 % SOLN OPHTHALMIC SOLUTION    Place 2 drops into both eyes  at bedtime.    TRAZODONE (DESYREL) 50 MG TABLET    Take 100 mg by mouth at bedtime.    VITAMIN B-12 (CYANOCOBALAMIN) 500 MCG TABLET    Take 500 mcg by mouth daily.   VITAMIN C (ASCORBIC ACID) 500 MG TABLET    Take 500 mg by mouth daily.  Modified Medications   No medications on file  Discontinued Medications   SACCHAROMYCES BOULARDII (FLORASTOR) 250 MG CAPSULE    Take 1 capsule (250 mg total) by mouth 2 (two) times daily.     Review of Systems  Unable to perform ROS: Dementia     Filed Vitals:   07/16/14 0920  BP: 128/74  Pulse: 74   There is no weight on file to calculate BMI.  Physical Exam  Constitutional: She is oriented to person, place, and time. She appears well-developed and well-nourished.  HENT:  Mouth/Throat: Oropharynx is clear and moist. No oropharyngeal exudate.  Eyes: Pupils are equal, round, and reactive to light. No scleral icterus.  Neck: Neck supple. No tracheal deviation present. No thyromegaly present.  Cardiovascular: Normal rate, regular rhythm, normal heart sounds and intact distal pulses.  Exam reveals no gallop and no friction rub.   No murmur heard. Trace R>LLE edema with calf swelling. no calf TTP. No carotid bruit b/l  Pulmonary/Chest: Effort normal and breath sounds normal. No stridor. No respiratory distress. She has no wheezes. She has no rales.  Abdominal: Soft. Bowel sounds are normal. She exhibits no distension, no abdominal bruit, no pulsatile midline mass and no mass. There is no hepatosplenomegaly. There is no tenderness. There is no rebound and no guarding.  Musculoskeletal:  Right 1st MCP joint swelling and TTP. Reduced right grip strength; reduced right patella DTR   Lymphadenopathy:    She has no cervical adenopathy.  Neurological: She is alert and oriented to person, place, and time. She has normal reflexes.  Skin: Skin is warm and dry. No rash noted.  Psychiatric: She has a normal mood and affect. Her behavior is normal. Judgment and  thought content normal.     Labs reviewed: Nursing Home on 08/02/2014  Component Date Value Ref Range Status  . Triglycerides 06/12/2014 83  40 - 160 mg/dL Final  . Cholesterol 06/12/2014 137  0 - 200 mg/dL Final  . HDL 06/12/2014 53  35 - 70 mg/dL Final  . LDL Cholesterol 06/12/2014 68   Final     Assessment/Plan   ICD-9-CM ICD-10-CM   1. Encounter for annual physical exam V70.0 Z00.00   2. Finger pain, right - new 729.5 M79.644    right index finger  3. Dementia, without behavioral disturbance - stable on aricept. Also taking trazodone and sertraline to stabilize mood 294.20 F03.90   4. Atrial fibrillation with controlled ventricular response on coreg. ASA/plavix for anticoagulation 427.31 I48.91   5. Essential hypertension - controlled 401.9 I10   6. Anemia, unspecified anemia type 285.9 D64.9   7. Primary osteoarthritis of both hands 715.14 M19.041     M19.042   8. Type 2 diabetes mellitus without complication 408.14 G81.8   9. Chronic pain - stable on lidoderm patch, gabapentin, volteran gel, flexeril and tylenol 338.29 G89.29   10. Chronic diastolic heart failure - controlled on coreg, hydralazine, lasix and amlodipine  --check right hand xray for finger pain. Obtain uric acid level to r/o gout  --pain control as above  --she refused influenza and pneumovax in 2015  --continue nutritional supplement BID for FTT  --CODE  STATUS: DNR  --continue all other medications as ordered. MAR updated  --will follow. Communicated with nursing above plan   [place] S. Perlie Gold  [facility] [address] [place], [place] [address] [phone] Office (Wednesdays and Fridays 8 AM - 5 PM) [phone] Cell (Monday-Friday 8 AM - 5 PM)

## 2014-08-10 DIAGNOSIS — F411 Generalized anxiety disorder: Secondary | ICD-10-CM | POA: Diagnosis not present

## 2014-08-10 DIAGNOSIS — F329 Major depressive disorder, single episode, unspecified: Secondary | ICD-10-CM | POA: Diagnosis not present

## 2014-08-10 DIAGNOSIS — G4700 Insomnia, unspecified: Secondary | ICD-10-CM | POA: Diagnosis not present

## 2014-08-10 DIAGNOSIS — F0391 Unspecified dementia with behavioral disturbance: Secondary | ICD-10-CM | POA: Diagnosis not present

## 2014-08-18 LAB — TSH: TSH: 4.09 u[IU]/mL (ref <=5.90)

## 2014-09-03 ENCOUNTER — Non-Acute Institutional Stay (SKILLED_NURSING_FACILITY): Payer: Medicare Other | Admitting: Adult Health

## 2014-09-03 DIAGNOSIS — J9610 Chronic respiratory failure, unspecified whether with hypoxia or hypercapnia: Secondary | ICD-10-CM

## 2014-09-03 DIAGNOSIS — D649 Anemia, unspecified: Secondary | ICD-10-CM

## 2014-09-03 DIAGNOSIS — I10 Essential (primary) hypertension: Secondary | ICD-10-CM | POA: Diagnosis not present

## 2014-09-03 DIAGNOSIS — G8929 Other chronic pain: Secondary | ICD-10-CM

## 2014-09-03 DIAGNOSIS — I4891 Unspecified atrial fibrillation: Secondary | ICD-10-CM | POA: Diagnosis not present

## 2014-09-03 DIAGNOSIS — E785 Hyperlipidemia, unspecified: Secondary | ICD-10-CM

## 2014-09-03 DIAGNOSIS — I5032 Chronic diastolic (congestive) heart failure: Secondary | ICD-10-CM

## 2014-09-04 LAB — BASIC METABOLIC PANEL
BUN: 24 mg/dL — AB (ref 4–21)
CREATININE: 1.4 mg/dL — AB (ref 0.5–1.1)
Glucose: 110 mg/dL
Potassium: 4 mmol/L (ref 3.4–5.3)
SODIUM: 146 mmol/L (ref 137–147)

## 2014-09-22 DIAGNOSIS — H4011X3 Primary open-angle glaucoma, severe stage: Secondary | ICD-10-CM | POA: Diagnosis not present

## 2014-09-22 DIAGNOSIS — H04123 Dry eye syndrome of bilateral lacrimal glands: Secondary | ICD-10-CM | POA: Diagnosis not present

## 2014-09-22 DIAGNOSIS — E119 Type 2 diabetes mellitus without complications: Secondary | ICD-10-CM | POA: Diagnosis not present

## 2014-09-22 LAB — HM DIABETES EYE EXAM

## 2014-10-01 ENCOUNTER — Non-Acute Institutional Stay (SKILLED_NURSING_FACILITY): Payer: Medicare Other | Admitting: Internal Medicine

## 2014-10-01 ENCOUNTER — Encounter: Payer: Self-pay | Admitting: Internal Medicine

## 2014-10-01 DIAGNOSIS — I5032 Chronic diastolic (congestive) heart failure: Secondary | ICD-10-CM

## 2014-10-01 DIAGNOSIS — F0390 Unspecified dementia without behavioral disturbance: Secondary | ICD-10-CM | POA: Diagnosis not present

## 2014-10-01 DIAGNOSIS — F32A Depression, unspecified: Secondary | ICD-10-CM

## 2014-10-01 DIAGNOSIS — I10 Essential (primary) hypertension: Secondary | ICD-10-CM

## 2014-10-01 DIAGNOSIS — G8929 Other chronic pain: Secondary | ICD-10-CM | POA: Diagnosis not present

## 2014-10-01 DIAGNOSIS — I4891 Unspecified atrial fibrillation: Secondary | ICD-10-CM

## 2014-10-01 DIAGNOSIS — F329 Major depressive disorder, single episode, unspecified: Secondary | ICD-10-CM | POA: Diagnosis not present

## 2014-10-01 DIAGNOSIS — E119 Type 2 diabetes mellitus without complications: Secondary | ICD-10-CM

## 2014-10-01 NOTE — Progress Notes (Signed)
Patient ID: Maria Christian, female   DOB: 1925/04/08, 81 y.o.   MRN: [id number]      10/01/14  Location:  [facility]    Place of Service: SNF [phone])   Extended Emergency Contact Information Primary Emergency Contact: [name] [place] of [place] Phone: [phone] Relation: Son Secondary Emergency Contact: Roberts,Lynn Address: [address]          [place], [place] [address] [name] of [place] Phone: [phone] Mobile Phone: [phone] Relation: Daughter  Advanced Directive information  DNR  Chief Complaint  Patient presents with  . Medical Management of Chronic Issues    HPI:  81 yo female long term resident seen today for f/u. She has no c/o. She is a poor historian due to dementia. Hx obtained from chart. No nursing issues. No falls  Dementia stable on aricept  She takes coreg, hydralazine, lasix and amlodipine for BP and heart failure. Afib controlled on coreg. She takes plavix/ASA for anticoagulation  Pain controlled with diclofenac, flexeril and lidoderm patch  DM is diet controlled  Mood stable on zoloft  She takes several vitamins and nutritional supplements    Past Medical History  Diagnosis Date  . Atrial fibrillation   . Altered mental status   . Encephalopathy   . Parkinson disease   . Hypertension   . GERD (gastroesophageal reflux disease)   . Hyperlipemia   . Diabetes mellitus   . Coronary artery disease   . History of adenomatous polyp of colon 06/24/99  . Enlarged heart   . DEMENTIA   . Edema of lower extremity 07/13/11    right leg more swollen than left leg  . Esophageal dysmotility 07/02/12  . Horseshoe kidney   . Pancreatic lesion 05/22/11    no further workup  per PCP/family due to age  . Anemia   . Peripheral neuropathy   . Depression   . Thrombophlebitis     Past Surgical History  Procedure Laterality Date  . Shoulder surgery  2001    left clavicle excision and acromioplasty  .  Abdominal hysterectomy    . Cholecystectomy    . Total hip arthroplasty    . Breast surgery      2 benign tumors removed left breast  . Foot surgery      benign tumors from foot  . Esophageal dilation      several times by Dr. [name]  . Nose surgery    . Esophagogastroduodenoscopy (egd) with esophageal dilation N/A 08/01/2012    Procedure: ESOPHAGOGASTRODUODENOSCOPY (EGD) WITH ESOPHAGEAL DILATION;  Surgeon: [name], MD;  Location: [facility];  Service: Endoscopy;  Laterality: N/A;  with c-arm savory dilators  . Flexible sigmoidoscopy N/A 05/24/2013    Procedure: FLEXIBLE SIGMOIDOSCOPY;  Surgeon: [name], MD;  Location: [facility];  Service: Endoscopy;  Laterality: N/A;  . Flexible sigmoidoscopy N/A 05/26/2013    Procedure:  flex with decompression of sigmoid volvulus;  Surgeon: [name], MD;  Location: [facility];  Service: Endoscopy;  Laterality: N/A;    Patient Care Team: [name], DO as PCP - General (Internal Medicine) Gerlene Fee, NP as Nurse Practitioner (Nurse Practitioner)  History   Social History  . Marital Status: Widowed    Spouse Name: N/A  . Number of Children: 5  . Years of Education: 10th   Occupational History  . Retired    Social History Main Topics  . Smoking status: Former Smoker  Quit date: 10/09/1964  . Smokeless tobacco: Never Used  . Alcohol Use: No  . Drug Use: No  . Sexual Activity: No   Other Topics Concern  . Not on file   Social History Narrative   Pt lives at [facility] and [place].   Caffeine Use: very small amount daily     reports that she quit smoking about 50 years ago. She has never used smokeless tobacco. She reports that she does not drink alcohol or use illicit drugs.   There is no immunization history on file for this patient.  Allergies  Allergen Reactions  . Aspirin Other (See Comments)    G.I. Upset only    Medications: Patient's Medications  New Prescriptions   No  medications on file  Previous Medications   ACETAMINOPHEN (TYLENOL) 500 MG TABLET    Take 1,000 mg by mouth 3 (three) times daily.   AMLODIPINE (NORVASC) 2.5 MG TABLET    Take 1 tablet (2.5 mg total) by mouth daily.   ASPIRIN 81 MG CHEWABLE TABLET    Chew 81 mg by mouth daily.   CARVEDILOL (COREG) 3.125 MG TABLET    Take 3.125 mg by mouth daily.   CHOLECALCIFEROL (VITAMIN D) 1000 UNITS TABLET    Take 1,000 Units by mouth daily.   CLOPIDOGREL (PLAVIX) 75 MG TABLET    Take 75 mg by mouth daily.    CRANBERRY 475 MG CAPS    Take 1 capsule by mouth 2 (two) times daily.   CYCLOBENZAPRINE (FLEXERIL) 5 MG TABLET    Take 5 mg by mouth at bedtime.   DICLOFENAC SODIUM (VOLTAREN) 1 % GEL    Apply 2 g topically 2 (two) times daily as needed (left neck pain).    DONEPEZIL (ARICEPT) 10 MG TABLET    Take 10 mg by mouth at bedtime.   DORZOLAMIDE (TRUSOPT) 2 % OPHTHALMIC SOLUTION    Place 1 drop into both eyes 3 (three) times daily.    FERROUS SULFATE 325 (65 FE) MG TABLET    Take 325 mg by mouth daily with breakfast.   FUROSEMIDE (LASIX) 40 MG TABLET    Take 1 tablet (40 mg total) by mouth 2 (two) times daily.   GABAPENTIN (NEURONTIN) 300 MG CAPSULE    Take 300 mg by mouth 2 (two) times daily.   HYDRALAZINE (APRESOLINE) 10 MG TABLET    Take 10 mg by mouth 3 (three) times daily. HOLD if SBP <100/60 and pulse is less than 60   LIDOCAINE (LIDODERM) 5 %    Place 1 patch onto the skin daily. Apply 1 patch to left lower back/hip every morning.  Remove & Discard patch within 12 hours or as directed by MD   LORATADINE (CLARITIN) 10 MG TABLET    Take 10 mg by mouth daily.   MULTIPLE VITAMINS-MINERALS (CERTAGEN PO)    Take 1 tablet by mouth daily.    NON FORMULARY    Take 90 mLs by mouth 2 (two) times daily. 2 cal supplement   POLYETHYL GLYCOL-PROPYL GLYCOL (SYSTANE) 0.4-0.3 % SOLN    Place 1 drop into both eyes 4 (four) times daily as needed (dry eyes).   POTASSIUM CHLORIDE SA (K-DUR,KLOR-CON) 20 MEQ TABLET    Take 20  mEq by mouth daily.   PRAVASTATIN (PRAVACHOL) 20 MG TABLET    Take 20 mg by mouth at bedtime.    SENNA (SENOKOT) 8.6 MG TABS TABLET    Take 1 tablet (8.6 mg total) by mouth  2 (two) times daily.   SERTRALINE (ZOLOFT) 50 MG TABLET    Take 50 mg by mouth daily.    SODIUM BICARBONATE 650 MG TABLET    Take 1,300 mg by mouth 3 (three) times daily.   SODIUM CHLORIDE (OCEAN) 0.65 % SOLN NASAL SPRAY    Place 1 spray into both nostrils as needed for congestion (dryness).   TRAVOPROST, BAK FREE, (TRAVATAN) 0.004 % SOLN OPHTHALMIC SOLUTION    Place 2 drops into both eyes at bedtime.    TRAZODONE (DESYREL) 50 MG TABLET    Take 100 mg by mouth at bedtime.    VITAMIN B-12 (CYANOCOBALAMIN) 500 MCG TABLET    Take 500 mcg by mouth daily.   VITAMIN C (ASCORBIC ACID) 500 MG TABLET    Take 500 mg by mouth daily.  Modified Medications   No medications on file  Discontinued Medications   No medications on file    Review of Systems  Unable to perform ROS: Dementia    Filed Vitals:   10/01/14 1550  BP: 124/68  Pulse: 80  Temp: 98 F (36.7 C)  SpO2: 97%   There is no weight on file to calculate BMI.  Physical Exam  Constitutional: She appears well-developed.  Frail appearing in NAD  HENT:  Mouth/Throat: Oropharynx is clear and moist. No oropharyngeal exudate.  She is wearing dentures  Eyes: Pupils are equal, round, and reactive to light. No scleral icterus.  Neck: Neck supple. Carotid bruit is not present. No tracheal deviation present. No thyromegaly present.  Cardiovascular: Normal rate, regular rhythm and intact distal pulses.  Exam reveals no gallop and no friction rub.   Murmur (1/6 SEM ) heard. trace LE edema b/l. no calf TTP.   Pulmonary/Chest: Effort normal and breath sounds normal. No stridor. No respiratory distress. She has no wheezes. She has no rales.  Abdominal: Soft. Bowel sounds are normal. She exhibits no distension and no mass. There is no hepatomegaly. There is no tenderness. There  is no rebound and no guarding.  Musculoskeletal: She exhibits edema and tenderness.  PIP and DIP joint swelling  Lymphadenopathy:    She has no cervical adenopathy.  Neurological: She is alert.  Skin: Skin is warm and dry. No rash noted.  Psychiatric: She has a normal mood and affect. Her behavior is normal.     Labs reviewed: Nursing Home on 08/02/2014  Component Date Value Ref Range Status  . Triglycerides 06/12/2014 83  40 - 160 mg/dL Final  . Cholesterol 06/12/2014 137  0 - 200 mg/dL Final  . HDL 06/12/2014 53  35 - 70 mg/dL Final  . LDL Cholesterol 06/12/2014 68   Final   Recent Results (from the past 2160 hour(s))  Basic metabolic panel     Status: Abnormal   Collection Time: 09/04/14 12:00 AM  Result Value Ref Range   Glucose 110 mg/dL   BUN 24 (A) 4 - 21 mg/dL   Creatinine 1.4 (A) 0.5 - 1.1 mg/dL   Potassium 4.0 3.4 - 5.3 mmol/L   Sodium 146 137 - 147 mmol/L  HM DIABETES EYE EXAM     Status: None   Collection Time: 09/22/14 12:00 AM  Result Value Ref Range   HM Diabetic Eye Exam No Retinopathy No Retinopathy   Lab Results  Component Value Date   HGBA1C 6.1* 07/17/2014   CBC Latest Ref Rng 07/17/2014 03/19/2014 01/04/2014  WBC - 3.8 3.7 4.2  Hemoglobin 12.0 - 16.0 g/dL 9.7(A) 9.9(A) 8.8(L)  Hematocrit 36 - 46 % 31(A) 33(A) 27.3(L)  Platelets 150 - 399 K/L 113(A) 121(A) 130(L)      No results found.   Assessment/Plan   ICD-9-CM ICD-10-CM   1. Dementia, without behavioral disturbance - stable 294.20 F03.90   2. Essential hypertension - controlled 401.9 I10   3. Depression- stable 311 F32.9   4. Chronic pain - controlled 338.29 G89.29   5. Diastolic CHF, chronic - stable 428.32 I50.32    428.0    6. Atrial fibrillation with controlled ventricular response - stable 427.31 I48.91   7. Type 2 diabetes mellitus without complication - diet controlled 250.00 E11.9    --Pt is medically stable on current tx plan. Continue current medications as ordered. PT/OT/ST  as indicated. Will follow   Madlyn Crosby S. Perlie Gold  [facility] [address] [place], [place] [address] [phone] Office (Wednesdays and Fridays 8 AM - 5 PM) [phone] Cell (Monday-Friday 8 AM - 5 PM)

## 2014-10-02 ENCOUNTER — Encounter: Payer: Self-pay | Admitting: *Deleted

## 2014-10-06 NOTE — Progress Notes (Signed)
Patient ID: Maria Christian, female   DOB: 11/28/1924, 81 y.o.   MRN: [id number]  [facility]     Allergies  Allergen Reactions  . Aspirin Other (See Comments)    [name]       Chief Complaint  Patient presents with  . Medical Management of Chronic Issues    HPI:  She is a long term resident of this facility being seen for the management of her chronic illnesses. Overall her status is without significant change. She is not voicing any complaints or concerns today. There are no nursing concerns today.    Past Medical History  Diagnosis Date  . Atrial fibrillation   . Altered mental status   . Encephalopathy   . Parkinson disease   . Hypertension   . GERD (gastroesophageal reflux disease)   . Hyperlipemia   . Diabetes mellitus   . Coronary artery disease   . History of adenomatous polyp of colon 06/24/99  . Enlarged heart   . DEMENTIA   . Edema of lower extremity 07/13/11    right leg more swollen than left leg  . Esophageal dysmotility 07/02/12  . Horseshoe kidney   . Pancreatic lesion 05/22/11    no further workup  per PCP/family due to age  . Anemia   . Peripheral neuropathy   . Depression   . Thrombophlebitis     Past Surgical History  Procedure Laterality Date  . Shoulder surgery  2001    left clavicle excision and acromioplasty  . Abdominal hysterectomy    . Cholecystectomy    . Total hip arthroplasty    . Breast surgery      2 benign tumors removed left breast  . Foot surgery      benign tumors from foot  . Esophageal dilation      several times by Dr. [name]  . Nose surgery    . Esophagogastroduodenoscopy (egd) with esophageal dilation N/A 08/01/2012    Procedure: ESOPHAGOGASTRODUODENOSCOPY (EGD) WITH ESOPHAGEAL DILATION;  Surgeon: [name], MD;  Location: [facility];  Service: Endoscopy;  Laterality: N/A;  with c-arm savory dilators  . Flexible sigmoidoscopy N/A 05/24/2013    Procedure: FLEXIBLE SIGMOIDOSCOPY;  Surgeon: [name], MD;  Location: [facility];  Service: Endoscopy;  Laterality: N/A;  . Flexible sigmoidoscopy N/A 05/26/2013    Procedure:  flex with decompression of sigmoid volvulus;  Surgeon: [name], MD;  Location: [facility];  Service: Endoscopy;  Laterality: N/A;    VITAL SIGNS BP 124/70 mmHg  Pulse 78  Ht 5\' 7"  (1.702 m)  Wt 151 lb (68.493 kg)  BMI 23.64 kg/m2   Outpatient Encounter Prescriptions as of 09/03/2014  Medication Sig  . acetaminophen (TYLENOL) 500 MG tablet Take 1,000 mg by mouth 3 (three) times daily.  Marland Kitchen amLODipine (NORVASC) 2.5 MG tablet Take 1 tablet (2.5 mg total) by mouth daily.  Marland Kitchen aspirin 81 MG chewable tablet Chew 81 mg by mouth daily.  . carvedilol (COREG) 3.125 MG tablet Take 3.125 mg by mouth daily.  . cholecalciferol (VITAMIN D) 1000 UNITS tablet Take 1,000 Units by mouth daily.  . clopidogrel (PLAVIX) 75 MG tablet Take 75 mg by mouth daily.   . Cranberry 475 MG CAPS Take 1 capsule by mouth 2 (two) times daily.  . cyclobenzaprine (FLEXERIL) 5 MG tablet Take 5 mg by mouth at bedtime.  . diclofenac sodium (VOLTAREN) 1 % GEL Apply 2 g topically 2 (two) times daily as needed (left  neck pain).   Marland Kitchen donepezil (ARICEPT) 10 MG tablet Take 10 mg by mouth at bedtime.  . dorzolamide (TRUSOPT) 2 % ophthalmic solution Place 1 drop into both eyes 3 (three) times daily.   . ferrous sulfate 325 (65 FE) MG tablet Take 325 mg by mouth daily with breakfast.  . furosemide (LASIX) 40 MG tablet Take 1 tablet (40 mg total) by mouth 2 (two) times daily.  Marland Kitchen gabapentin (NEURONTIN) 300 MG capsule Take 300 mg by mouth 2 (two) times daily.  . hydrALAZINE (APRESOLINE) 10 MG tablet Take 10 mg by mouth 3 (three) times daily. HOLD if SBP <100/60 and pulse is less than 60  . lidocaine (LIDODERM) 5 % Place 1 patch onto the skin daily. Apply 1 patch to left lower back/hip every morning.  Remove & Discard patch within 12 hours or as directed by MD  . loratadine (CLARITIN) 10 MG tablet Take 10  mg by mouth daily.  . Multiple Vitamins-Minerals (CERTAGEN PO) Take 1 tablet by mouth daily.   . NON FORMULARY Take 90 mLs by mouth 2 (two) times daily. 2 cal supplement  . Polyethyl Glycol-Propyl Glycol (SYSTANE) 0.4-0.3 % SOLN Place 1 drop into both eyes 4 (four) times daily as needed (dry eyes).  . potassium chloride SA (K-DUR,KLOR-CON) 20 MEQ tablet Take 20 mEq by mouth daily.  . pravastatin (PRAVACHOL) 20 MG tablet Take 20 mg by mouth at bedtime.   . senna (SENOKOT) 8.6 MG TABS tablet Take 1 tablet (8.6 mg total) by mouth 2 (two) times daily.  . sertraline (ZOLOFT) 50 MG tablet Take 50 mg by mouth daily.   . sodium bicarbonate 650 MG tablet Take 1,300 mg by mouth 3 (three) times daily.  . sodium chloride (OCEAN) 0.65 % SOLN nasal spray Place 1 spray into both nostrils as needed for congestion (dryness).  . Travoprost, BAK Free, (TRAVATAN) 0.004 % SOLN ophthalmic solution Place 2 drops into both eyes at bedtime.   . traZODone (DESYREL) 50 MG tablet Take 100 mg by mouth at bedtime.   . vitamin B-12 (CYANOCOBALAMIN) 500 MCG tablet Take 500 mcg by mouth daily.  . vitamin C (ASCORBIC ACID) 500 MG tablet Take 500 mg by mouth daily.      SIGNIFICANT DIAGNOSTIC EXAMS  07-16-14: right hand x-ray: no acute abnormalities    LABS REVIEWED:   12-11-13: hgb a1c 5.6  03-19-14: wbc 3.7; hgb 9.9; hct 33.2; mcv 108.5; plt 121; glucose 122; bun 25.7; creat 1.3; k+4.1; na++139; liver normal albumin 4.3 07-17-14: wbc 3.8; hgb 9.7; ;hct 31.;2 mcv 108; plt 113; glucose 154; bun 21.3; creat 1.43;  Liver normal albumin 4.0; tsh 6.13; hgb a1c 6.1; uric acid 7.1  08-18-14: tsh 4.09     ROS Constitutional: Negative for malaise/fatigue.  Respiratory: Negative for cough and shortness of breath.   Cardiovascular: negative for palpitations  Negative for chest pain and leg swelling.  Gastrointestinal: Negative for heartburn, abdominal pain and constipation.  Musculoskeletal:  Negative for myalgias and joint  pain.    Skin: Negative.   Neurological: Negative for headaches.  Psychiatric/Behavioral: Negative for depression. The patient is not nervous/anxious.        Physical Exam Constitutional: She appears well-developed and well-nourished. No distress.  Neck: Neck supple. No JVD present. No thyromegaly present.  Cardiovascular: Normal rate and intact distal pulses.   Heart rate irregular   Respiratory: Effort normal and breath sounds normal. No respiratory distress.  GI: Soft. Bowel sounds are normal. There is no tenderness.  Musculoskeletal: She exhibits no edema.  Is able to move all extremities   Neurological: She is alert.  Skin: Skin is warm and dry. She is not diaphoretic.     ASSESSMENT/ PLAN:   1. Chronic diastolic heart failure: is presently stable will continue daily weights; will continue lasix 40 mg twice daily with k+ 20 meq daily hydralazine 10 mg three times daily coreg 3.125 mg daily and will monitor her status.   2. Afib: her heart rate is stable will continue coreg 3.125 mg daily for rate control; wll continue plavix 75 mg daily and asa 81 mg daily will monitor  3. Hypertension: is stable will continue norvasc 2.5 mg daily; coreg 3.125 mg daily; hydralazine 10 mg three times daily;  Will monitor   4. Chronic respiratory failure: is stable on chronic 02; will continue sodium bicarb 1300 mg three times daily for C02 balance   5. Anemia: hgb is 9.9; will continue iron daily   6. Dyslipidemia: will continue pravachol 20 mg daily   7. Allergic rhinitis: will continue claritin daily   8. Constipation: will continue senna twice daily   9. Depression: is stable; is followed by [facility] services; receives benefit from zoloft 50 mg daily and trazodone 50 mg nightly for sleep will monitor   10. Dementia: is without change will continue aricept 10 mg daily will monitor   11.  Chronic pain due to osteoarthritis: her pain is presently managed on her current regimen: flexeril 5  mg nightly; voltaren gel 2 gm to neck twice daily as needed;  neurontin 300 mg twice daily; lidoderm to lower back; tylenol 1 gm three times daily will monitor   12. Glaucoma: will continue travoprost to both eyes nightly; trusopt to both eyes three times daily      Ok Edwards NP [facility]  Contact [phone] Monday through Friday 8am- 5pm  After hours call [phone]

## 2014-11-03 ENCOUNTER — Encounter: Payer: Self-pay | Admitting: Adult Health

## 2014-11-03 ENCOUNTER — Non-Acute Institutional Stay (SKILLED_NURSING_FACILITY): Payer: Medicare Other | Admitting: Adult Health

## 2014-11-03 DIAGNOSIS — J9610 Chronic respiratory failure, unspecified whether with hypoxia or hypercapnia: Secondary | ICD-10-CM

## 2014-11-03 DIAGNOSIS — I4891 Unspecified atrial fibrillation: Secondary | ICD-10-CM

## 2014-11-03 DIAGNOSIS — I5032 Chronic diastolic (congestive) heart failure: Secondary | ICD-10-CM | POA: Diagnosis not present

## 2014-11-03 DIAGNOSIS — I10 Essential (primary) hypertension: Secondary | ICD-10-CM

## 2014-11-03 DIAGNOSIS — E785 Hyperlipidemia, unspecified: Secondary | ICD-10-CM

## 2014-11-03 DIAGNOSIS — K5901 Slow transit constipation: Secondary | ICD-10-CM | POA: Diagnosis not present

## 2014-11-03 DIAGNOSIS — H409 Unspecified glaucoma: Secondary | ICD-10-CM | POA: Diagnosis not present

## 2014-11-03 DIAGNOSIS — F0390 Unspecified dementia without behavioral disturbance: Secondary | ICD-10-CM | POA: Diagnosis not present

## 2014-11-03 DIAGNOSIS — G8929 Other chronic pain: Secondary | ICD-10-CM | POA: Diagnosis not present

## 2014-11-03 DIAGNOSIS — D649 Anemia, unspecified: Secondary | ICD-10-CM | POA: Diagnosis not present

## 2014-11-03 MED ORDER — ACETAMINOPHEN 325 MG PO TABS: 650.0000 mg | ORAL_TABLET | Freq: Four times a day (QID) | ORAL | Status: DC | PRN

## 2014-11-03 MED ORDER — TRAZODONE HCL 50 MG PO TABS: 25.0000 mg | ORAL_TABLET | Freq: Every evening | ORAL | Status: DC | PRN

## 2014-11-03 NOTE — Progress Notes (Signed)
Patient ID: Maria Christian, female   DOB: 13-Feb-1925, 81 y.o.   MRN: [id number]  [facility]     Allergies  Allergen Reactions  . Aspirin Other (See Comments)    [name]       Chief Complaint  Patient presents with  . Medical Management of Chronic Issues  . Acute Visit    family meeting     HPI:  She is a long term resident of this facility being seen for the management of her chronic illnesses. Overall her status is stable and states that she is feeling "good". There are no nursing concerns at this time.  Her family is concerned about her medications and would as many of them to be stopped as possible. They are concerned that she is being overdosed on tylenol at 3 gm daily divided in 3 doses. They would like this medication changed to 650 mg every 6 hours as needed. They want the flexeril stopped. They want the pravachol stopped; the neurontin stopped; aricept stopped sodium bicarb stopped  and trazodone changed to as needed only. We did discuss every medication and its use. They did verbalize understanding of the education given.    Past Medical History  Diagnosis Date  . Atrial fibrillation   . Altered mental status   . Encephalopathy   . Parkinson disease   . Hypertension   . GERD (gastroesophageal reflux disease)   . Hyperlipemia   . Diabetes mellitus   . Coronary artery disease   . History of adenomatous polyp of colon 06/24/99  . Enlarged heart   . DEMENTIA   . Edema of lower extremity 07/13/11    right leg more swollen than left leg  . Esophageal dysmotility 07/02/12  . Horseshoe kidney   . Pancreatic lesion 05/22/11    no further workup  per PCP/family due to age  . Anemia   . Peripheral neuropathy   . Depression   . Thrombophlebitis   . TIA (transient ischemic attack) 12/19/2012  . Sigmoid volvulus 05/24/2013    Past Surgical History  Procedure Laterality Date  . Shoulder surgery  2001    left clavicle excision and acromioplasty  . Abdominal  hysterectomy    . Cholecystectomy    . Total hip arthroplasty    . Breast surgery      2 benign tumors removed left breast  . Foot surgery      benign tumors from foot  . Esophageal dilation      several times by Dr. [name]  . Nose surgery    . Esophagogastroduodenoscopy (egd) with esophageal dilation N/A 08/01/2012    Procedure: ESOPHAGOGASTRODUODENOSCOPY (EGD) WITH ESOPHAGEAL DILATION;  Surgeon: [name], MD;  Location: [facility];  Service: Endoscopy;  Laterality: N/A;  with c-arm savory dilators  . Flexible sigmoidoscopy N/A 05/24/2013    Procedure: FLEXIBLE SIGMOIDOSCOPY;  Surgeon: [name], MD;  Location: [facility];  Service: Endoscopy;  Laterality: N/A;  . Flexible sigmoidoscopy N/A 05/26/2013    Procedure:  flex with decompression of sigmoid volvulus;  Surgeon: [name], MD;  Location: [facility];  Service: Endoscopy;  Laterality: N/A;    VITAL SIGNS BP 132/60 mmHg  Pulse 74  Ht 5\' 7"  (1.702 m)  Wt 154 lb (69.854 kg)  BMI 24.11 kg/m2  SpO2 96%   Outpatient Encounter Prescriptions as of 11/03/2014  Medication Sig  . acetaminophen (TYLENOL) 500 MG tablet Take 1,000 mg by mouth 3 (three) times daily.  Marland Kitchen  amLODipine (NORVASC) 2.5 MG tablet Take 1 tablet (2.5 mg total) by mouth daily.  Marland Kitchen aspirin 81 MG chewable tablet Chew 81 mg by mouth daily.  . carvedilol (COREG) 3.125 MG tablet Take 3.125 mg by mouth daily.  . cholecalciferol (VITAMIN D) 1000 UNITS tablet Take 1,000 Units by mouth daily.  . clopidogrel (PLAVIX) 75 MG tablet Take 75 mg by mouth daily.   . Cranberry 475 MG CAPS Take 1 capsule by mouth 2 (two) times daily.  . cyclobenzaprine (FLEXERIL) 5 MG tablet Take 5 mg by mouth at bedtime.  . diclofenac sodium (VOLTAREN) 1 % GEL Apply 2 g topically 2 (two) times daily as needed (left neck pain).   Marland Kitchen donepezil (ARICEPT) 10 MG tablet Take 10 mg by mouth at bedtime.  . dorzolamide (TRUSOPT) 2 % ophthalmic solution Place 1 drop into both eyes 3  (three) times daily.   . ferrous sulfate 325 (65 FE) MG tablet Take 325 mg by mouth daily with breakfast.  . furosemide (LASIX) 40 MG tablet Take 1 tablet (40 mg total) by mouth 2 (two) times daily.  Marland Kitchen gabapentin (NEURONTIN) 300 MG capsule Take 300 mg by mouth 2 (two) times daily.  . hydrALAZINE (APRESOLINE) 10 MG tablet Take 10 mg by mouth 3 (three) times daily. HOLD if SBP <100/60 and pulse is less than 60  . lidocaine (LIDODERM) 5 % Place 1 patch onto the skin daily. Apply 1 patch to left lower back/hip every morning.  Remove & Discard patch within 12 hours or as directed by MD  . loratadine (CLARITIN) 10 MG tablet Take 10 mg by mouth daily.  . Multiple Vitamins-Minerals (CERTAGEN PO) Take 1 tablet by mouth daily.   . NON FORMULARY Take 90 mLs by mouth 2 (two) times daily. 2 cal supplement  . Polyethyl Glycol-Propyl Glycol (SYSTANE) 0.4-0.3 % SOLN Place 1 drop into both eyes 4 (four) times daily as needed (dry eyes).  . potassium chloride SA (K-DUR,KLOR-CON) 20 MEQ tablet Take 20 mEq by mouth daily.  . pravastatin (PRAVACHOL) 20 MG tablet Take 20 mg by mouth at bedtime.   . senna (SENOKOT) 8.6 MG TABS tablet Take 1 tablet (8.6 mg total) by mouth 2 (two) times daily.  . sertraline (ZOLOFT) 50 MG tablet Take 50 mg by mouth daily.   . sodium bicarbonate 650 MG tablet Take 1,300 mg by mouth 3 (three) times daily.  . sodium chloride (OCEAN) 0.65 % SOLN nasal spray Place 1 spray into both nostrils as needed for congestion (dryness).  . Travoprost, BAK Free, (TRAVATAN) 0.004 % SOLN ophthalmic solution Place 2 drops into both eyes at bedtime.   . traZODone (DESYREL) 50 MG tablet Take 25 mg by mouth at bedtime.   . vitamin B-12 (CYANOCOBALAMIN) 500 MCG tablet Take 500 mcg by mouth daily.  . vitamin C (ASCORBIC ACID) 500 MG tablet Take 500 mg by mouth daily.     SIGNIFICANT DIAGNOSTIC EXAMS  07-16-14: right hand x-ray: no acute abnormalities   10-13-14: chest x-ray: no acute cardiopulmonary process  present.    LABS REVIEWED:   12-11-13: hgb a1c 5.6  03-19-14: wbc 3.7; hgb 9.9; hct 33.2; mcv 108.5; plt 121; glucose 122; bun 25.7; creat 1.3; k+4.1; na++139; liver normal albumin 4.3 07-17-14: wbc 3.8; hgb 9.7; ;hct 31.;2 mcv 108; plt 113; glucose 154; bun 21.3; creat 1.43;  Liver normal albumin 4.0; tsh 6.13; hgb a1c 6.1; uric acid 7.1  08-18-14: tsh 4.09  10-13-14: wbc 4.7; hgb 9.7; hct 31.1; mcv  105.3; plt 111; glucose 176; bun 27.7; creat 1.44; k+4.4; na++143; C02: 35; liver normal albumin 4.5    ROS Constitutional: Negative for malaise/fatigue.  Respiratory: Negative for cough and shortness of breath.   Cardiovascular:  Negative for chest pain and leg swelling. has palpitations  Gastrointestinal: Negative for heartburn, abdominal pain and constipation.  Musculoskeletal:  Negative for myalgias and joint pain.    Skin: Negative.   Neurological: Negative for headaches.  Psychiatric/Behavioral: Negative for depression. The patient is not nervous/anxious.       Physical Exam Constitutional: She appears well-developed and well-nourished. No distress.  Neck: Neck supple. No JVD present. No thyromegaly present.  Cardiovascular: Normal rate and intact distal pulses.   Heart rate irregular   Respiratory: Effort normal and breath sounds normal. No respiratory distress.  GI: Soft. Bowel sounds are normal. There is no tenderness.  Musculoskeletal: She exhibits no edema.  Is able to move all extremities   Neurological: She is alert.  Skin: Skin is warm and dry. She is not diaphoretic.        ASSESSMENT/ PLAN:   1. Chronic diastolic heart failure: is presently stable will continue daily weights; will continue lasix 40 mg twice daily with k+ 20 meq daily hydralazine 10 mg three times daily coreg 3.125 mg daily and will monitor her status.   2. Afib: her heart rate is stable will continue coreg 3.125 mg daily for rate control; wll continue plavix 75 mg daily and asa 81 mg daily will  monitor  3. Hypertension: is stable will continue norvasc 2.5 mg daily; coreg 3.125 mg daily; hydralazine 10 mg three times daily;  Will monitor   4. Chronic respiratory failure: is stable on chronic 02; will stop the sodium bicarb at this time and will monitor   5. Anemia: hgb is 9.9; will continue iron daily   6. Dyslipidemia: will stop the pravachol will check lipids in 2 months   7. Allergic rhinitis: will continue claritin daily   8. Constipation: will continue senna twice daily   9. Depression: is stable; is followed by [facility] services; receives benefit from zoloft 50 mg daily; will change her trazodone to 25 mg nightly as needed  10. Dementia: is without change will lower the aricept to 5 mg nightly for one week and stop    11.  Chronic pain due to osteoarthritis: her pain is presently managed:will continue   voltaren gel 2 gm to neck twice daily as needed; lidoderm to lower back; will lower the neurontin to 300 mg every pm for one week then stop; will stop the flexeril; will change the tylenol to 650 mg every 6 hours as needed and will monitor her pain.   12. Glaucoma: will continue travoprost to both eyes nightly; trusopt to both eyes three times daily    Will check ekg; hgb a1c;    Time spent with patient 60 minutes.    Ok Edwards NP [facility]  Contact [phone] Monday through Friday 8am- 5pm  After hours call [phone]

## 2014-11-17 DIAGNOSIS — F411 Generalized anxiety disorder: Secondary | ICD-10-CM | POA: Diagnosis not present

## 2014-11-17 DIAGNOSIS — G4700 Insomnia, unspecified: Secondary | ICD-10-CM | POA: Diagnosis not present

## 2014-11-17 DIAGNOSIS — F329 Major depressive disorder, single episode, unspecified: Secondary | ICD-10-CM | POA: Diagnosis not present

## 2014-11-17 DIAGNOSIS — F0391 Unspecified dementia with behavioral disturbance: Secondary | ICD-10-CM | POA: Diagnosis not present

## 2014-11-18 ENCOUNTER — Non-Acute Institutional Stay (SKILLED_NURSING_FACILITY): Payer: Medicare Other | Admitting: Adult Health

## 2014-11-18 DIAGNOSIS — G4700 Insomnia, unspecified: Secondary | ICD-10-CM

## 2014-11-18 DIAGNOSIS — G8929 Other chronic pain: Secondary | ICD-10-CM | POA: Diagnosis not present

## 2014-11-18 DIAGNOSIS — J309 Allergic rhinitis, unspecified: Secondary | ICD-10-CM

## 2014-12-03 ENCOUNTER — Non-Acute Institutional Stay (SKILLED_NURSING_FACILITY): Payer: Medicare Other | Admitting: Adult Health

## 2014-12-03 ENCOUNTER — Encounter: Payer: Self-pay | Admitting: Adult Health

## 2014-12-03 DIAGNOSIS — J9610 Chronic respiratory failure, unspecified whether with hypoxia or hypercapnia: Secondary | ICD-10-CM | POA: Diagnosis not present

## 2014-12-03 DIAGNOSIS — F0390 Unspecified dementia without behavioral disturbance: Secondary | ICD-10-CM

## 2014-12-03 DIAGNOSIS — I4891 Unspecified atrial fibrillation: Secondary | ICD-10-CM | POA: Diagnosis not present

## 2014-12-03 DIAGNOSIS — K5901 Slow transit constipation: Secondary | ICD-10-CM

## 2014-12-03 DIAGNOSIS — I5032 Chronic diastolic (congestive) heart failure: Secondary | ICD-10-CM

## 2014-12-03 DIAGNOSIS — I10 Essential (primary) hypertension: Secondary | ICD-10-CM | POA: Diagnosis not present

## 2014-12-03 DIAGNOSIS — F32A Depression, unspecified: Secondary | ICD-10-CM

## 2014-12-03 DIAGNOSIS — F329 Major depressive disorder, single episode, unspecified: Secondary | ICD-10-CM | POA: Diagnosis not present

## 2014-12-03 DIAGNOSIS — E785 Hyperlipidemia, unspecified: Secondary | ICD-10-CM

## 2014-12-03 DIAGNOSIS — G8929 Other chronic pain: Secondary | ICD-10-CM

## 2014-12-03 MED ORDER — ESCITALOPRAM OXALATE 10 MG PO TABS: 10.0000 mg | ORAL_TABLET | Freq: Every day | ORAL | Status: DC

## 2014-12-03 NOTE — Progress Notes (Signed)
Patient ID: Maria Christian, female   DOB: 1924/09/24, 81 y.o.   MRN: [id number]  [facility]     Allergies  Allergen Reactions  . Aspirin Other (See Comments)    [name]       Chief Complaint  Patient presents with  . Medical Management of Chronic Issues    HPI:  She is a long term resident of this facility being seen for the management of her chronic illnesses. overall her status remains stable. She is not voicing any complaints or concerns at this time. There are no nursing concerns at this time.  Her son has requested that she come her zoloft. He does not mind using an different SSRI but does not like zoloft. I did explain to him that she is doing well with this medication; he still wants the medication changed.    Past Medical History  Diagnosis Date  . Atrial fibrillation   . Altered mental status   . Encephalopathy   . Parkinson disease   . Hypertension   . GERD (gastroesophageal reflux disease)   . Hyperlipemia   . Diabetes mellitus   . Coronary artery disease   . History of adenomatous polyp of colon 06/24/99  . Enlarged heart   . DEMENTIA   . Edema of lower extremity 07/13/11    right leg more swollen than left leg  . Esophageal dysmotility 07/02/12  . Horseshoe kidney   . Pancreatic lesion 05/22/11    no further workup  per PCP/family due to age  . Anemia   . Peripheral neuropathy   . Depression   . Thrombophlebitis   . TIA (transient ischemic attack) 12/19/2012  . Sigmoid volvulus 05/24/2013    Past Surgical History  Procedure Laterality Date  . Shoulder surgery  2001    left clavicle excision and acromioplasty  . Abdominal hysterectomy    . Cholecystectomy    . Total hip arthroplasty    . Breast surgery      2 benign tumors removed left breast  . Foot surgery      benign tumors from foot  . Esophageal dilation      several times by Dr. [name]  . Nose surgery    . Esophagogastroduodenoscopy (egd) with esophageal dilation N/A  08/01/2012    Procedure: ESOPHAGOGASTRODUODENOSCOPY (EGD) WITH ESOPHAGEAL DILATION;  Surgeon: [name], MD;  Location: [facility];  Service: Endoscopy;  Laterality: N/A;  with c-arm savory dilators  . Flexible sigmoidoscopy N/A 05/24/2013    Procedure: FLEXIBLE SIGMOIDOSCOPY;  Surgeon: [name], MD;  Location: [facility];  Service: Endoscopy;  Laterality: N/A;  . Flexible sigmoidoscopy N/A 05/26/2013    Procedure:  flex with decompression of sigmoid volvulus;  Surgeon: [name], MD;  Location: [facility];  Service: Endoscopy;  Laterality: N/A;    VITAL SIGNS BP 137/69 mmHg  Pulse 64  Ht 5\' 7"  (1.702 m)  Wt 156 lb (70.761 kg)  BMI 24.43 kg/m2  SpO2 97%   Outpatient Encounter Prescriptions as of 12/03/2014  Medication Sig  . acetaminophen (TYLENOL) 325 MG tablet Take  650 mg by mouth 2 (two) times daily. )  . amLODipine (NORVASC) 2.5 MG tablet Take 1 tablet (2.5 mg total) by mouth daily.  Marland Kitchen aspirin 81 MG chewable tablet Chew 81 mg by mouth daily.  . baclofen (LIORESAL) 10 MG tablet Take 5 mg by mouth at bedtime.  . carvedilol (COREG) 3.125 MG tablet Take 3.125 mg by mouth daily.  Marland Kitchen  cholecalciferol (VITAMIN D) 1000 UNITS tablet Take 1,000 Units by mouth daily.  . clopidogrel (PLAVIX) 75 MG tablet Take 75 mg by mouth daily.   . Cranberry 475 MG CAPS Take 1 capsule by mouth 2 (two) times daily.  . diclofenac sodium (VOLTAREN) 1 % GEL Apply 2 g topically 2 (two) times daily as needed (left neck pain).   . dorzolamide (TRUSOPT) 2 % ophthalmic solution Place 1 drop into both eyes 3 (three) times daily.   . ferrous sulfate 325 (65 FE) MG tablet Take 325 mg by mouth daily with breakfast.  . furosemide (LASIX) 40 MG tablet Take 1 tablet (40 mg total) by mouth 2 (two) times daily.  . hydrALAZINE (APRESOLINE) 10 MG tablet Take 10 mg by mouth 3 (three) times daily. HOLD if SBP <100/60 and pulse is less than 60  . lidocaine (LIDODERM) 5 % Place 1 patch onto the skin daily. Apply  1 patch to left lower back/hip every morning.  Remove & Discard patch within 12 hours or as directed by MD  . loratadine (CLARITIN) 10 MG tablet Take 10 mg by mouth daily.  . Multiple Vitamins-Minerals (CERTAGEN PO) Take 1 tablet by mouth daily.   . NON FORMULARY Take 90 mLs by mouth 2 (two) times daily. 2 cal supplement  . Polyethyl Glycol-Propyl Glycol (SYSTANE) 0.4-0.3 % SOLN Place 1 drop into both eyes 4 (four) times daily as needed (dry eyes).  . potassium chloride SA (K-DUR,KLOR-CON) 20 MEQ tablet Take 20 mEq by mouth daily.  Marland Kitchen senna (SENOKOT) 8.6 MG TABS tablet Take 1 tablet (8.6 mg total) by mouth 2 (two) times daily.  . sertraline (ZOLOFT) 50 MG tablet Take 50 mg by mouth daily.   . sodium chloride (OCEAN) 0.65 % SOLN nasal spray Place 1 spray into both nostrils 4 (four) times daily. And every 4 hours as needed  . Travoprost, BAK Free, (TRAVATAN) 0.004 % SOLN ophthalmic solution Place 2 drops into both eyes at bedtime.   . traZODone (DESYREL) 50 MG tablet Take 25 mg by mouth at bedtime. )  . vitamin B-12 (CYANOCOBALAMIN) 500 MCG tablet Take 500 mcg by mouth daily.  . vitamin C (ASCORBIC ACID) 500 MG tablet Take 500 mg by mouth daily.      SIGNIFICANT DIAGNOSTIC EXAMS  07-16-14: right hand x-ray: no acute abnormalities   10-13-14: chest x-ray: no acute cardiopulmonary process present.    LABS REVIEWED:   12-11-13: hgb a1c 5.6  03-19-14: wbc 3.7; hgb 9.9; hct 33.2; mcv 108.5; plt 121; glucose 122; bun 25.7; creat 1.3; k+4.1; na++139; liver normal albumin 4.3 07-17-14: wbc 3.8; hgb 9.7; ;hct 31.;2 mcv 108; plt 113; glucose 154; bun 21.3; creat 1.43;  Liver normal albumin 4.0; tsh 6.13; hgb a1c 6.1; uric acid 7.1  08-18-14: tsh 4.09  10-13-14: wbc 4.7; hgb 9.7; hct 31.1; mcv 105.3; plt 111; glucose 176; bun 27.7; creat 1.44; k+4.4; na++143; C02: 35; liver normal albumin 4.5     ROS Constitutional: Negative for malaise/fatigue.  Respiratory: Negative for cough and shortness of  breath.   Cardiovascular:  Negative for chest pain and leg swelling.  Gastrointestinal: Negative for heartburn, abdominal pain and constipation.  Musculoskeletal:  Negative for myalgias and joint pain.    Skin: Negative.   Neurological: Negative for headaches.  Psychiatric/Behavioral: Negative for depression. The patient is not nervous/anxious.      Physical Exam Constitutional: She appears well-developed and well-nourished. No distress.  Neck: Neck supple. No JVD present. No thyromegaly present.  Cardiovascular: Normal rate and intact distal pulses.   Heart rate irregular   Respiratory: Effort normal and breath sounds normal. No respiratory distress.  GI: Soft. Bowel sounds are normal. There is no tenderness.  Musculoskeletal: She exhibits no edema.  Is able to move all extremities   Neurological: She is alert.  Skin: Skin is warm and dry. She is not diaphoretic.     ASSESSMENT/ PLAN:  1. Chronic diastolic heart failure: is presently stable will continue daily weights; will continue lasix 40 mg twice daily with k+ 20 meq daily hydralazine 10 mg three times daily coreg 3.125 mg daily and will monitor her status.   2. Afib: her heart rate is stable will continue coreg 3.125 mg daily for rate control; wll continue plavix 75 mg daily and asa 81 mg daily will monitor  3. Hypertension: is stable will continue norvasc 2.5 mg daily; coreg 3.125 mg daily; hydralazine 10 mg three times daily;  Will monitor   4. Chronic respiratory failure: is stable on chronic 02;  will monitor   5. Anemia: hgb is 9.9; will continue iron daily   6. Dyslipidemia: is presently not on medications; will monitor her status.    7. Allergic rhinitis: will continue claritin daily   8. Constipation: will continue senna twice daily   9. Depression: is stable; is followed by [facility]; will stop the zoloft at this time; will begin lexapro 5 mg daily for one week then 10 mg daily   10. Dementia: is without  change is presently off medications; will not make changes    11.  Chronic pain due to osteoarthritis: her pain is presently managed:will continue   voltaren gel 2 gm to neck twice daily as needed; lidoderm to lower back; will continue tylenol 650 mg twice daily; and will continue baclofen 5 mg nightly for leg spasticity and will monitor   12. Glaucoma: will continue travoprost to both eyes nightly; trusopt to both eyes three times daily       Ok Edwards NP [facility]  Contact [phone] Monday through Friday 8am- 5pm  After hours call [phone]

## 2014-12-16 ENCOUNTER — Non-Acute Institutional Stay (SKILLED_NURSING_FACILITY): Payer: Medicare Other | Admitting: Adult Health

## 2014-12-16 ENCOUNTER — Encounter: Payer: Self-pay | Admitting: Adult Health

## 2014-12-16 DIAGNOSIS — I5032 Chronic diastolic (congestive) heart failure: Secondary | ICD-10-CM

## 2014-12-16 DIAGNOSIS — J069 Acute upper respiratory infection, unspecified: Secondary | ICD-10-CM | POA: Diagnosis not present

## 2014-12-16 DIAGNOSIS — J309 Allergic rhinitis, unspecified: Secondary | ICD-10-CM | POA: Diagnosis not present

## 2014-12-16 NOTE — Progress Notes (Signed)
Patient ID: Maria Christian, female   DOB: [birth year], 81 y.o.   MRN: [id number]  [facility]     Allergies  Allergen Reactions  . Aspirin Other (See Comments)    [name] only       Chief Complaint  Patient presents with  . Acute Visit    cough and congestion     HPI:    Past Medical History  Diagnosis Date  . Atrial fibrillation   . Altered mental status   . Encephalopathy   . Parkinson disease   . Hypertension   . GERD (gastroesophageal reflux disease)   . Hyperlipemia   . Diabetes mellitus   . Coronary artery disease   . History of adenomatous polyp of colon 06/24/99  . Enlarged heart   . DEMENTIA   . Edema of lower extremity 07/13/11    right leg more swollen than left leg  . Esophageal dysmotility 07/02/12  . Horseshoe kidney   . Pancreatic lesion 05/22/11    no further workup  per PCP/family due to age  . Anemia   . Peripheral neuropathy   . Depression   . Thrombophlebitis   . TIA (transient ischemic attack) 12/19/2012  . Sigmoid volvulus 05/24/2013    Past Surgical History  Procedure Laterality Date  . Shoulder surgery  2001    left clavicle excision and acromioplasty  . Abdominal hysterectomy    . Cholecystectomy    . Total hip arthroplasty    . Breast surgery      2 benign tumors removed left breast  . Foot surgery      benign tumors from foot  . Esophageal dilation      several times by Dr. [name]  . Nose surgery    . Esophagogastroduodenoscopy (egd) with esophageal dilation N/A 08/01/2012    Procedure: ESOPHAGOGASTRODUODENOSCOPY (EGD) WITH ESOPHAGEAL DILATION;  Surgeon: [name], MD;  Location: [facility];  Service: Endoscopy;  Laterality: N/A;  with c-arm savory dilators  . Flexible sigmoidoscopy N/A 05/24/2013    Procedure: FLEXIBLE SIGMOIDOSCOPY;  Surgeon: [name], MD;  Location: [facility];  Service: Endoscopy;  Laterality: N/A;  . Flexible sigmoidoscopy N/A 05/26/2013    Procedure:  flex with decompression of sigmoid  volvulus;  Surgeon: [name], MD;  Location: [facility];  Service: Endoscopy;  Laterality: N/A;    VITAL SIGNS BP 109/53 mmHg  Pulse 65  Ht 5\' 7"  (1.702 m)  Wt 156 lb (70.761 kg)  BMI 24.43 kg/m2  SpO2 97%   Outpatient Encounter Prescriptions as of 12/16/2014  Medication Sig  . acetaminophen (TYLENOL) 325 MG tablet  Take 650 mg by mouth 2 (two) times daily. )  . amLODipine (NORVASC) 2.5 MG tablet Take 1 tablet (2.5 mg total) by mouth daily.  Marland Kitchen aspirin 81 MG chewable tablet Chew 81 mg by mouth daily.  . baclofen (LIORESAL) 10 MG tablet Take 5 mg by mouth at bedtime.  . carvedilol (COREG) 3.125 MG tablet Take 3.125 mg by mouth daily.  . cholecalciferol (VITAMIN D) 1000 UNITS tablet Take 1,000 Units by mouth daily.  . clopidogrel (PLAVIX) 75 MG tablet Take 75 mg by mouth daily.   . Cranberry 475 MG CAPS Take 1 capsule by mouth 2 (two) times daily.  . diclofenac sodium (VOLTAREN) 1 % GEL Apply 2 g topically 2 (two) times daily as needed (left neck pain).   . dorzolamide (TRUSOPT) 2 % ophthalmic solution Place 1 drop into both eyes 3 (three) times  daily.   . escitalopram (LEXAPRO) 10 MG tablet Take 1 tablet (10 mg total) by mouth daily.  . ferrous sulfate 325 (65 FE) MG tablet Take 325 mg by mouth daily with breakfast.  . furosemide (LASIX) 40 MG tablet Take 1 tablet (40 mg total) by mouth 2 (two) times daily.  . hydrALAZINE (APRESOLINE) 10 MG tablet Take 10 mg by mouth 3 (three) times daily. HOLD if SBP <100/60 and pulse is less than 60  . lidocaine (LIDODERM) 5 % Place 1 patch onto the skin daily. Apply 1 patch to left lower back/hip every morning.  Remove & Discard patch within 12 hours or as directed by MD  . loratadine (CLARITIN) 10 MG tablet Take 10 mg by mouth daily.  . Multiple Vitamins-Minerals (CERTAGEN PO) Take 1 tablet by mouth daily.   . NON FORMULARY Take 90 mLs by mouth 2 (two) times daily. 2 cal supplement  . Polyethyl Glycol-Propyl Glycol (SYSTANE) 0.4-0.3 % SOLN  Place 1 drop into both eyes 4 (four) times daily as needed (dry eyes).  . potassium chloride SA (K-DUR,KLOR-CON) 20 MEQ tablet Take 20 mEq by mouth daily.  Marland Kitchen senna (SENOKOT) 8.6 MG TABS tablet Take 1 tablet (8.6 mg total) by mouth 2 (two) times daily.  . sodium chloride (OCEAN) 0.65 % SOLN nasal spray Place 1 spray into both nostrils 4 (four) times daily. And every 4 hours as needed  . Travoprost, BAK Free, (TRAVATAN) 0.004 % SOLN ophthalmic solution Place 2 drops into both eyes at bedtime.   . traZODone (DESYREL) 50 MG tablet  Take 25 mg by mouth at bedtime. )  . vitamin B-12 (CYANOCOBALAMIN) 500 MCG tablet Take 500 mcg by mouth daily.  . vitamin C (ASCORBIC ACID) 500 MG tablet Take 500 mg by mouth daily.      SIGNIFICANT DIAGNOSTIC EXAMS  07-16-14: right hand x-ray: no acute abnormalities   10-13-14: chest x-ray: no acute cardiopulmonary process present.   12-16-14: chest x-ray: moderate cardiomegaly; with mild pulmonary venous congestion    LABS REVIEWED:   12-11-13: hgb a1c 5.6  03-19-14: wbc 3.7; hgb 9.9; hct 33.2; mcv 108.5; plt 121; glucose 122; bun 25.7; creat 1.3; k+4.1; na++139; liver normal albumin 4.3 07-17-14: wbc 3.8; hgb 9.7; ;hct 31.;2 mcv 108; plt 113; glucose 154; bun 21.3; creat 1.43;  Liver normal albumin 4.0; tsh 6.13; hgb a1c 6.1; uric acid 7.1  08-18-14: tsh 4.09  10-13-14: wbc 4.7; hgb 9.7; hct 31.1; mcv 105.3; plt 111; glucose 176; bun 27.7; creat 1.44; k+4.4; na++143; C02: 35; liver normal albumin 4.5 11-04-14: hgb 9.1; hct 30.5      Review of Systems  Constitutional: Negative for malaise/fatigue.  HENT: Positive for congestion.   Respiratory: Positive for cough, sputum production and shortness of breath.        Thick white sputum  Cardiovascular: Negative for chest pain, palpitations and leg swelling.  Musculoskeletal: Negative for myalgias and joint pain.  Skin: Negative.   Psychiatric/Behavioral: The patient is not nervous/anxious.      Physical  Exam Constitutional: She appears well-developed and well-nourished. No distress. has frontal sinus tenderness present Neck: Neck supple. No JVD present. No thyromegaly present. no cervical lymphadenopathy present Cardiovascular: Normal rate and intact distal pulses.   Heart rate irregular   Respiratory: Effort normal breath sounds diminished bilateral bases.  GI: Soft. Bowel sounds are normal. There is no tenderness.  Musculoskeletal: She exhibits no edema.  Is able to move all extremities   Neurological: She is alert.  Skin: Skin is warm and dry. She is not diaphoretic.     ASSESSMENT/ PLAN:   1. Chronic diastolic heart failure: is presently stable will continue daily weights; will k+ 20 meq daily hydralazine 10 mg three times daily coreg 3.125 mg daily will change lasix to 60 mg in the am and 40 mg in the pm for 4 days then return to 40 mg twice daily; will check bmp and will repeat chest x-ray on 12-21-14.    2. Allergic rhinitis: will continue claritin daily will begin flonase 2 puffs nightly   3. Upper respiratory infection: will begin augmentin 875 mg twice daily for 10 days with florastor twice daily    Ok Edwards NP [facility]  Contact [phone] Monday through Friday 8am- 5pm  After hours call [phone]

## 2014-12-29 ENCOUNTER — Encounter: Payer: Self-pay | Admitting: Adult Health

## 2014-12-29 NOTE — Progress Notes (Signed)
Patient ID: Maria Christian, female   DOB: [birth year], 81 y.o.   MRN: [id number]   Facility: [facility]      Allergies  Allergen Reactions  . Aspirin Other (See Comments)    Maria Christian    Chief Complaint  Patient presents with  . Acute Visit    patient concerns    HPI:  She has numerous concerns. She is not sleeping at night and wants her trazodone returned to a routine basis. She is having pain and would like her tylenol returned to a routine basis as well fo rher neck pain. She is having leg spasms at night and wants a medication to help with her spasms. She is having allergy complaints and has a dry nose from her oxygen.    Past Medical History  Diagnosis Date  . Atrial fibrillation   . Altered mental status   . Encephalopathy   . Parkinson disease   . Hypertension   . GERD (gastroesophageal reflux disease)   . Hyperlipemia   . Diabetes mellitus   . Coronary artery disease   . History of adenomatous polyp of colon 06/24/99  . Enlarged heart   . DEMENTIA   . Edema of lower extremity 07/13/11    right leg more swollen than left leg  . Esophageal dysmotility 07/02/12  . Horseshoe kidney   . Pancreatic lesion 05/22/11    no further workup  per PCP/family due to age  . Anemia   . Peripheral neuropathy   . Depression   . Thrombophlebitis   . TIA (transient ischemic attack) 12/19/2012  . Sigmoid volvulus 05/24/2013    Past Surgical History  Procedure Laterality Date  . Shoulder surgery  2001    left clavicle excision and acromioplasty  . Abdominal hysterectomy    . Cholecystectomy    . Total hip arthroplasty    . Breast surgery      2 benign tumors removed left breast  . Foot surgery      benign tumors from foot  . Esophageal dilation      several times by Dr. [name]  . Nose surgery    . Esophagogastroduodenoscopy (egd) with esophageal dilation N/A 08/01/2012    Procedure: ESOPHAGOGASTRODUODENOSCOPY (EGD) WITH ESOPHAGEAL DILATION;   Surgeon: [name], MD;  Location: [facility];  Service: Endoscopy;  Laterality: N/A;  with c-arm savory dilators  . Flexible sigmoidoscopy N/A 05/24/2013    Procedure: FLEXIBLE SIGMOIDOSCOPY;  Surgeon: [name], MD;  Location: [facility];  Service: Endoscopy;  Laterality: N/A;  . Flexible sigmoidoscopy N/A 05/26/2013    Procedure:  flex with decompression of sigmoid volvulus;  Surgeon: [name], MD;  Location: [facility];  Service: Endoscopy;  Laterality: N/A;    VITAL SIGNS BP 126/82 mmHg  Pulse 80  Ht 5\' 7"  (1.702 m)  Wt 159 lb (72.122 kg)  BMI 24.90 kg/m2  SpO2 97%  Patient's Medications  New Prescriptions   No medications on file  Previous Medications   ACETAMINOPHEN (TYLENOL) 325 MG TABLET    Take 2 tablets (650 mg total) by mouth every 6 (six) hours as needed for mild pain.   AMLODIPINE (NORVASC) 2.5 MG TABLET    Take 1 tablet (2.5 mg total) by mouth daily.   ASPIRIN 81 MG CHEWABLE TABLET    Chew 81 mg by mouth daily.   BACLOFEN (LIORESAL) 10 MG TABLET    Take 5 mg by mouth at bedtime.   CARVEDILOL (COREG)  3.125 MG TABLET    Take 3.125 mg by mouth daily.   CHOLECALCIFEROL (VITAMIN D) 1000 UNITS TABLET    Take 1,000 Units by mouth daily.   CLOPIDOGREL (PLAVIX) 75 MG TABLET    Take 75 mg by mouth daily.    CRANBERRY 475 MG CAPS    Take 1 capsule by mouth 2 (two) times daily.   DICLOFENAC SODIUM (VOLTAREN) 1 % GEL    Apply 2 g topically 2 (two) times daily as needed (left neck pain).    DORZOLAMIDE (TRUSOPT) 2 % OPHTHALMIC SOLUTION    Place 1 drop into both eyes 3 (three) times daily.    ESCITALOPRAM (LEXAPRO) 10 MG TABLET    Take 1 tablet (10 mg total) by mouth daily.   FERROUS SULFATE 325 (65 FE) MG TABLET    Take 325 mg by mouth daily with breakfast.   FUROSEMIDE (LASIX) 40 MG TABLET    Take 1 tablet (40 mg total) by mouth 2 (two) times daily.   HYDRALAZINE (APRESOLINE) 10 MG TABLET    Take 10 mg by mouth 3 (three) times daily. HOLD if SBP <100/60 and  pulse is less than 60   LIDOCAINE (LIDODERM) 5 %    Place 1 patch onto the skin daily. Apply 1 patch to left lower back/hip every morning.  Remove & Discard patch within 12 hours or as directed by MD   MULTIPLE VITAMINS-MINERALS (CERTAGEN PO)    Take 1 tablet by mouth daily.    NON FORMULARY    Take 90 mLs by mouth 2 (two) times daily. 2 cal supplement   POLYETHYL GLYCOL-PROPYL GLYCOL (SYSTANE) 0.4-0.3 % SOLN    Place 1 drop into both eyes 4 (four) times daily as needed (dry eyes).   POTASSIUM CHLORIDE SA (K-DUR,KLOR-CON) 20 MEQ TABLET    Take 20 mEq by mouth daily.   SENNA (SENOKOT) 8.6 MG TABS TABLET    Take 1 tablet (8.6 mg total) by mouth 2 (two) times daily.   SODIUM CHLORIDE (OCEAN) 0.65 % SOLN NASAL SPRAY    every 4 hours as needed   TRAVOPROST, BAK FREE, (TRAVATAN) 0.004 % SOLN OPHTHALMIC SOLUTION    Place 2 drops into both eyes at bedtime.    TRAZODONE (DESYREL) 50 MG TABLET    Take 0.5 tablets (25 mg total) by mouth at bedtime as needed for sleep.   VITAMIN B-12 (CYANOCOBALAMIN) 500 MCG TABLET    Take 500 mcg by mouth daily.   VITAMIN C (ASCORBIC ACID) 500 MG TABLET    Take 500 mg by mouth daily.  Modified Medications   No medications on file  Discontinued Medications   No medications on file     SIGNIFICANT DIAGNOSTIC EXAMS  07-16-14: right hand x-ray: no acute abnormalities   10-13-14: chest x-ray: no acute cardiopulmonary process present.    LABS REVIEWED:   12-11-13: hgb a1c 5.6  03-19-14: wbc 3.7; hgb 9.9; hct 33.2; mcv 108.5; plt 121; glucose 122; bun 25.7; creat 1.3; k+4.1; na++139; liver normal albumin 4.3 07-17-14: wbc 3.8; hgb 9.7; ;hct 31.;2 mcv 108; plt 113; glucose 154; bun 21.3; creat 1.43;  Liver normal albumin 4.0; tsh 6.13; hgb a1c 6.1; uric acid 7.1  08-18-14: tsh 4.09  10-13-14: wbc 4.7; hgb 9.7; hct 31.1; mcv 105.3; plt 111; glucose 176; bun 27.7; creat 1.44; k+4.4; na++143; C02: 35; liver normal albumin 4.5   Review of Systems  Constitutional: Negative for  appetite change and fatigue.  HENT: Positive for congestion.  Has dry nose  Respiratory: Negative for cough, chest tightness and shortness of breath.   Cardiovascular: Negative for chest pain, palpitations and leg swelling.  Gastrointestinal: Negative for nausea, abdominal pain, diarrhea and constipation.  Musculoskeletal: Positive for arthralgias. Negative for myalgias.       Has neck pain   Skin: Negative for pallor.       Has dry itchy skin   Neurological: Negative for dizziness.  Psychiatric/Behavioral: The patient is not nervous/anxious.       Physical Exam  Constitutional: No distress.  Eyes: Conjunctivae are normal.  Neck: Neck supple. No JVD present. No thyromegaly present.  Cardiovascular: Normal rate and intact distal pulses.   Heart rate irregular   Respiratory: Effort normal and breath sounds normal. No respiratory distress. She has no wheezes.  GI: Soft. Bowel sounds are normal. She exhibits no distension. There is no tenderness.  Musculoskeletal: She exhibits no edema.  Able to move all extremities   Lymphadenopathy:    She has no cervical adenopathy.  Neurological: She is alert.  Skin: Skin is warm and dry. She is not diaphoretic.  Psychiatric: She has a normal mood and affect.     ASSESSMENT/ PLAN:  Insomnia: will change her trazodone to 25 mg nightly Neck pain: will change her tylenol to 650 mg twice daily  Restless leg syndrome will begin baclofen 5 mg nightly  Allergic rhinitis: will begin claritin 10 mg daily and will use her saline nasal spray four times daily routinely and every 4 hours as needed     Ok Edwards NP [facility]  Contact [phone] Monday through Friday 8am- 5pm  After hours call [phone]

## 2015-01-05 ENCOUNTER — Non-Acute Institutional Stay (SKILLED_NURSING_FACILITY): Payer: Medicare Other | Admitting: Adult Health

## 2015-01-05 DIAGNOSIS — K219 Gastro-esophageal reflux disease without esophagitis: Secondary | ICD-10-CM | POA: Diagnosis not present

## 2015-01-05 DIAGNOSIS — J9610 Chronic respiratory failure, unspecified whether with hypoxia or hypercapnia: Secondary | ICD-10-CM

## 2015-01-05 DIAGNOSIS — I10 Essential (primary) hypertension: Secondary | ICD-10-CM | POA: Diagnosis not present

## 2015-01-05 DIAGNOSIS — H409 Unspecified glaucoma: Secondary | ICD-10-CM

## 2015-01-05 DIAGNOSIS — J309 Allergic rhinitis, unspecified: Secondary | ICD-10-CM

## 2015-01-05 DIAGNOSIS — E785 Hyperlipidemia, unspecified: Secondary | ICD-10-CM | POA: Diagnosis not present

## 2015-01-05 DIAGNOSIS — K5901 Slow transit constipation: Secondary | ICD-10-CM | POA: Diagnosis not present

## 2015-01-05 DIAGNOSIS — I5032 Chronic diastolic (congestive) heart failure: Secondary | ICD-10-CM

## 2015-01-05 DIAGNOSIS — I4891 Unspecified atrial fibrillation: Secondary | ICD-10-CM | POA: Diagnosis not present

## 2015-01-05 DIAGNOSIS — G8929 Other chronic pain: Secondary | ICD-10-CM | POA: Diagnosis not present

## 2015-01-05 DIAGNOSIS — D649 Anemia, unspecified: Secondary | ICD-10-CM

## 2015-01-28 ENCOUNTER — Non-Acute Institutional Stay (SKILLED_NURSING_FACILITY): Payer: Medicare Other | Admitting: Internal Medicine

## 2015-01-28 DIAGNOSIS — K5901 Slow transit constipation: Secondary | ICD-10-CM

## 2015-01-28 DIAGNOSIS — B029 Zoster without complications: Secondary | ICD-10-CM

## 2015-01-28 DIAGNOSIS — F0390 Unspecified dementia without behavioral disturbance: Secondary | ICD-10-CM

## 2015-02-04 ENCOUNTER — Encounter: Payer: Self-pay | Admitting: Internal Medicine

## 2015-02-04 ENCOUNTER — Non-Acute Institutional Stay (SKILLED_NURSING_FACILITY): Payer: Medicare Other | Admitting: Adult Health

## 2015-02-04 DIAGNOSIS — I10 Essential (primary) hypertension: Secondary | ICD-10-CM | POA: Diagnosis not present

## 2015-02-04 DIAGNOSIS — H409 Unspecified glaucoma: Secondary | ICD-10-CM | POA: Diagnosis not present

## 2015-02-04 DIAGNOSIS — K219 Gastro-esophageal reflux disease without esophagitis: Secondary | ICD-10-CM

## 2015-02-04 DIAGNOSIS — I5032 Chronic diastolic (congestive) heart failure: Secondary | ICD-10-CM | POA: Diagnosis not present

## 2015-02-04 DIAGNOSIS — G8929 Other chronic pain: Secondary | ICD-10-CM

## 2015-02-04 DIAGNOSIS — E785 Hyperlipidemia, unspecified: Secondary | ICD-10-CM | POA: Diagnosis not present

## 2015-02-04 DIAGNOSIS — E876 Hypokalemia: Secondary | ICD-10-CM

## 2015-02-04 DIAGNOSIS — I4891 Unspecified atrial fibrillation: Secondary | ICD-10-CM | POA: Diagnosis not present

## 2015-02-04 DIAGNOSIS — J9610 Chronic respiratory failure, unspecified whether with hypoxia or hypercapnia: Secondary | ICD-10-CM | POA: Diagnosis not present

## 2015-02-04 NOTE — Progress Notes (Signed)
Patient ID: Maria Christian, female   DOB: 19-Aug-1924, 81 y.o.   MRN: [id number]    DATE: 01/28/15  Location:  [facility]    Place of Service: [phone])   Extended Emergency Contact Information Primary Emergency Contact: [name] [place] Phone: [phone] Relation: Son Secondary Emergency Contact: Roberts,Lynn Address: [address]          [place], [place] [address] [name] Phone: [phone] Mobile Phone: [phone] Relation: Daughter  Advanced Directive information  DNR  Chief Complaint  Patient presents with  . Acute Visit    vaginal pain    HPI:  81 yo female long term resident seen today for groin pain. She has noted vaginal pain x 1 week at least. No d/c. No vaginal bleeding. She is not sexually active. The area is itchy and burning. No numbness or tingling. She has abdominal pain and bloating. She has chronic constipation not relieved adequately with senna. No bloody stools. No nursing issues. No recent falls. She is a poor historian due to dementia. Hx obtained from chart and nursing  Past Medical History  Diagnosis Date  . Atrial fibrillation   . Altered mental status   . Encephalopathy   . Parkinson disease   . Hypertension   . GERD (gastroesophageal reflux disease)   . Hyperlipemia   . Diabetes mellitus   . Coronary artery disease   . History of adenomatous polyp of colon 06/24/99  . Enlarged heart   . DEMENTIA   . Edema of lower extremity 07/13/11    right leg more swollen than left leg  . Esophageal dysmotility 07/02/12  . Horseshoe kidney   . Pancreatic lesion 05/22/11    no further workup  per PCP/family due to age  . Anemia   . Peripheral neuropathy   . Depression   . Thrombophlebitis   . TIA (transient ischemic attack) 12/19/2012  . Sigmoid volvulus 05/24/2013    Past Surgical History  Procedure Laterality Date  . Shoulder surgery  2001    left clavicle excision and  acromioplasty  . Abdominal hysterectomy    . Cholecystectomy    . Total hip arthroplasty    . Breast surgery      2 benign tumors removed left breast  . Foot surgery      benign tumors from foot  . Esophageal dilation      several times by Dr. [name]  . Nose surgery    . Esophagogastroduodenoscopy (egd) with esophageal dilation N/A 08/01/2012    Procedure: ESOPHAGOGASTRODUODENOSCOPY (EGD) WITH ESOPHAGEAL DILATION;  Surgeon: [name], MD;  Location: [facility];  Service: Endoscopy;  Laterality: N/A;  with c-arm savory dilators  . Flexible sigmoidoscopy N/A 05/24/2013    Procedure: FLEXIBLE SIGMOIDOSCOPY;  Surgeon: [name], MD;  Location: [facility];  Service: Endoscopy;  Laterality: N/A;  . Flexible sigmoidoscopy N/A 05/26/2013    Procedure:  flex with decompression of sigmoid volvulus;  Surgeon: [name], MD;  Location: [facility];  Service: Endoscopy;  Laterality: N/A;    Patient Care Team: [name], DO as PCP - General (Internal Medicine) Gerlene Fee, NP as Nurse Practitioner (Nurse Practitioner)  Social History   Social History  . Marital Status: Widowed    Spouse Name: N/A  . Number of Children: 5  . Years of Education: 10th   Occupational History  . Retired    Social History Main Topics  .  Smoking status: Former Smoker    Quit date: 10/09/1964  . Smokeless tobacco: Never Used  . Alcohol Use: No  . Drug Use: No  . Sexual Activity: No   Other Topics Concern  . Not on file   Social History Narrative   Pt lives at [facility] and [place].   Caffeine Use: very small amount daily     reports that she quit smoking about 50 years ago. She has never used smokeless tobacco. She reports that she does not drink alcohol or use illicit drugs.  Immunization History  Administered Date(s) Administered  . PPD Test 07/28/2013    Allergies  Allergen Reactions  . Aspirin Other (See Comments)    G.I. Upset only     Medications: Patient's Medications  New Prescriptions   No medications on file  Previous Medications   ACETAMINOPHEN (TYLENOL) 325 MG TABLET    Take 2 tablets (650 mg total) by mouth every 6 (six) hours as needed for mild pain.   AMLODIPINE (NORVASC) 2.5 MG TABLET    Take 1 tablet (2.5 mg total) by mouth daily.   ASPIRIN 81 MG CHEWABLE TABLET    Chew 81 mg by mouth daily.   BACLOFEN (LIORESAL) 10 MG TABLET    Take 5 mg by mouth at bedtime.   CARVEDILOL (COREG) 3.125 MG TABLET    Take 3.125 mg by mouth daily.   CHOLECALCIFEROL (VITAMIN D) 1000 UNITS TABLET    Take 1,000 Units by mouth daily.   CLOPIDOGREL (PLAVIX) 75 MG TABLET    Take 75 mg by mouth daily.    CRANBERRY 475 MG CAPS    Take 1 capsule by mouth 2 (two) times daily.   DICLOFENAC SODIUM (VOLTAREN) 1 % GEL    Apply 2 g topically 2 (two) times daily as needed (left neck pain).    DORZOLAMIDE (TRUSOPT) 2 % OPHTHALMIC SOLUTION    Place 1 drop into both eyes 3 (three) times daily.    ESCITALOPRAM (LEXAPRO) 10 MG TABLET    Take 1 tablet (10 mg total) by mouth daily.   FERROUS SULFATE 325 (65 FE) MG TABLET    Take 325 mg by mouth daily with breakfast.   FUROSEMIDE (LASIX) 40 MG TABLET    Take 1 tablet (40 mg total) by mouth 2 (two) times daily.   HYDRALAZINE (APRESOLINE) 10 MG TABLET    Take 10 mg by mouth 3 (three) times daily. HOLD if SBP <100/60 and pulse is less than 60   LIDOCAINE (LIDODERM) 5 %    Place 1 patch onto the skin daily. Apply 1 patch to left lower back/hip every morning.  Remove & Discard patch within 12 hours or as directed by MD   LORATADINE (CLARITIN) 10 MG TABLET    Take 10 mg by mouth daily.   MULTIPLE VITAMINS-MINERALS (CERTAGEN PO)    Take 1 tablet by mouth daily.    NON FORMULARY    Take 90 mLs by mouth 2 (two) times daily. 2 cal supplement   POLYETHYL GLYCOL-PROPYL GLYCOL (SYSTANE) 0.4-0.3 % SOLN    Place 1 drop into both eyes 4 (four) times daily as needed (dry eyes).   POTASSIUM CHLORIDE SA  (K-DUR,KLOR-CON) 20 MEQ TABLET    Take 20 mEq by mouth daily.   SENNA (SENOKOT) 8.6 MG TABS TABLET    Take 1 tablet (8.6 mg total) by mouth 2 (two) times daily.   SODIUM CHLORIDE (OCEAN) 0.65 % SOLN NASAL SPRAY    Place  1 spray into both nostrils 4 (four) times daily. And every 4 hours as needed   TRAVOPROST, BAK FREE, (TRAVATAN) 0.004 % SOLN OPHTHALMIC SOLUTION    Place 2 drops into both eyes at bedtime.    TRAZODONE (DESYREL) 50 MG TABLET    Take 0.5 tablets (25 mg total) by mouth at bedtime as needed for sleep.   VITAMIN B-12 (CYANOCOBALAMIN) 500 MCG TABLET    Take 500 mcg by mouth daily.   VITAMIN C (ASCORBIC ACID) 500 MG TABLET    Take 500 mg by mouth daily.  Modified Medications   No medications on file  Discontinued Medications   No medications on file    Review of Systems  Unable to perform ROS: Dementia    Filed Vitals:   01/28/15 1510  BP: 148/92  Pulse: 77  Temp: 97 F (36.1 C)  SpO2: 96%   There is no weight on file to calculate BMI.  Physical Exam  Constitutional: No distress.  Frail appearing in NAD. Lying in bed  Cardiovascular: Normal rate, regular rhythm, normal heart sounds and intact distal pulses.  Exam reveals no gallop and no friction rub.   No murmur heard. Pulmonary/Chest: Effort normal and breath sounds normal. No respiratory distress. She has no wheezes. She has no rales. She exhibits no tenderness.  Abdominal: Soft. Bowel sounds are normal. She exhibits distension. She exhibits no mass. There is no tenderness. There is no rebound and no guarding.  Neurological: She is alert.  Skin: Skin is warm and dry. Rash noted. Rash is vesicular.     Psychiatric: She has a normal mood and affect. Her speech is normal and behavior is normal.     Labs reviewed: No visits with results within 3 Month(s) from this visit. Latest known visit with results is:  Nursing Home on 10/01/2014  Component Date Value Ref Range Status  . Hemoglobin 07/17/2014 9.7* 12.0 -  16.0 g/dL Final  . HCT 07/17/2014 31* 36 - 46 % Final  . Platelets 07/17/2014 113* 150 - 399 K/L Final  . WBC 07/17/2014 3.8   Final  . HM Diabetic Eye Exam 09/22/2014 No Retinopathy  No Retinopathy Final  . Glucose 09/04/2014 110   Final  . BUN 09/04/2014 24* 4 - 21 mg/dL Final  . Creatinine 09/04/2014 1.4* 0.5 - 1.1 mg/dL Final  . Potassium 09/04/2014 4.0  3.4 - 5.3 mmol/L Final  . Sodium 09/04/2014 146  137 - 147 mmol/L Final  . Hgb A1c MFr Bld 07/17/2014 6.1* 4.0 - 6.0 % Final  . TSH 08/18/2014 4.09  .41 - 5.90 uIU/mL Final    No results found.   Assessment/Plan   ICD-9-CM ICD-10-CM   1. Shingles 053.9 B02.9    right pubic mons - resolving rash  2. Slow transit constipation  -failing to change as expected 564.01 K59.01   3. Dementia, without behavioral disturbance - stable 294.20 F03.90     --cool compresses to pubic area prn  --no antiviral tx indicated at this time as lesions are drying up and she has surpassed the tx window.  --d/c senna  --Rx linzess 145 mcg daily for constipation  --cont other meds as ordered  --will follow  Kjersten Ormiston S. Perlie Gold  [facility] [address] [place], [place] [address] [phone] Cell (Monday-Friday 8 AM - 5 PM) [phone] After 5 PM and follow prompts

## 2015-02-11 ENCOUNTER — Non-Acute Institutional Stay (SKILLED_NURSING_FACILITY): Payer: Medicare Other | Admitting: Adult Health

## 2015-02-11 ENCOUNTER — Telehealth: Payer: Self-pay | Admitting: Gastroenterology

## 2015-02-11 DIAGNOSIS — R519 Headache, unspecified: Secondary | ICD-10-CM

## 2015-02-11 DIAGNOSIS — K529 Noninfective gastroenteritis and colitis, unspecified: Secondary | ICD-10-CM

## 2015-02-11 DIAGNOSIS — N390 Urinary tract infection, site not specified: Secondary | ICD-10-CM | POA: Diagnosis not present

## 2015-02-11 DIAGNOSIS — R51 Headache: Secondary | ICD-10-CM

## 2015-02-11 NOTE — Telephone Encounter (Signed)
Daughter states mother has had nausea and vomiting for 3 days and nowmwithnsevere rightnsided pain. Advised to seek evaluation at ED

## 2015-02-26 ENCOUNTER — Emergency Department (EMERGENCY_DEPARTMENT_HOSPITAL)
Admit: 2015-02-26 | Discharge: 2015-02-26 | Disposition: A | Discharge status: Home / Self Care | Payer: Medicare Other | Attending: Emergency Medicine | Admitting: Emergency Medicine

## 2015-02-26 ENCOUNTER — Encounter: Payer: Self-pay | Admitting: Adult Health

## 2015-02-26 ENCOUNTER — Emergency Department (HOSPITAL_COMMUNITY): Payer: Medicare Other

## 2015-02-26 ENCOUNTER — Inpatient Hospital Stay (HOSPITAL_COMMUNITY)
Admission: EM | Admit: 2015-02-26 | Discharge: 2015-02-28 | DRG: 389 | Disposition: A | Discharge status: Skilled Nursing Facility | Payer: Medicare Other | Attending: Internal Medicine | Admitting: Internal Medicine

## 2015-02-26 ENCOUNTER — Encounter (HOSPITAL_COMMUNITY): Payer: Self-pay

## 2015-02-26 DIAGNOSIS — R109 Unspecified abdominal pain: Secondary | ICD-10-CM | POA: Diagnosis not present

## 2015-02-26 DIAGNOSIS — Z8673 Personal history of transient ischemic attack (TIA), and cerebral infarction without residual deficits: Secondary | ICD-10-CM

## 2015-02-26 DIAGNOSIS — Z86718 Personal history of other venous thrombosis and embolism: Secondary | ICD-10-CM

## 2015-02-26 DIAGNOSIS — R0781 Pleurodynia: Secondary | ICD-10-CM | POA: Diagnosis not present

## 2015-02-26 DIAGNOSIS — F03918 Unspecified dementia, unspecified severity, with other behavioral disturbance: Secondary | ICD-10-CM | POA: Diagnosis present

## 2015-02-26 DIAGNOSIS — K567 Ileus, unspecified: Principal | ICD-10-CM | POA: Diagnosis present

## 2015-02-26 DIAGNOSIS — E876 Hypokalemia: Secondary | ICD-10-CM | POA: Diagnosis present

## 2015-02-26 DIAGNOSIS — G8929 Other chronic pain: Secondary | ICD-10-CM | POA: Diagnosis present

## 2015-02-26 DIAGNOSIS — Z87891 Personal history of nicotine dependence: Secondary | ICD-10-CM | POA: Diagnosis not present

## 2015-02-26 DIAGNOSIS — I4891 Unspecified atrial fibrillation: Secondary | ICD-10-CM | POA: Diagnosis present

## 2015-02-26 DIAGNOSIS — F0391 Unspecified dementia with behavioral disturbance: Secondary | ICD-10-CM | POA: Diagnosis present

## 2015-02-26 DIAGNOSIS — E440 Moderate protein-calorie malnutrition: Secondary | ICD-10-CM | POA: Diagnosis present

## 2015-02-26 DIAGNOSIS — M7989 Other specified soft tissue disorders: Secondary | ICD-10-CM | POA: Diagnosis not present

## 2015-02-26 DIAGNOSIS — K5900 Constipation, unspecified: Secondary | ICD-10-CM | POA: Diagnosis present

## 2015-02-26 DIAGNOSIS — E1142 Type 2 diabetes mellitus with diabetic polyneuropathy: Secondary | ICD-10-CM | POA: Diagnosis present

## 2015-02-26 DIAGNOSIS — N183 Chronic kidney disease, stage 3 unspecified: Secondary | ICD-10-CM | POA: Diagnosis present

## 2015-02-26 DIAGNOSIS — F0390 Unspecified dementia without behavioral disturbance: Secondary | ICD-10-CM | POA: Diagnosis present

## 2015-02-26 DIAGNOSIS — R51 Headache: Secondary | ICD-10-CM | POA: Diagnosis present

## 2015-02-26 DIAGNOSIS — F32A Depression, unspecified: Secondary | ICD-10-CM | POA: Diagnosis present

## 2015-02-26 DIAGNOSIS — Z886 Allergy status to analgesic agent status: Secondary | ICD-10-CM

## 2015-02-26 DIAGNOSIS — I2510 Atherosclerotic heart disease of native coronary artery without angina pectoris: Secondary | ICD-10-CM | POA: Diagnosis present

## 2015-02-26 DIAGNOSIS — R627 Adult failure to thrive: Secondary | ICD-10-CM | POA: Diagnosis present

## 2015-02-26 DIAGNOSIS — J9610 Chronic respiratory failure, unspecified whether with hypoxia or hypercapnia: Secondary | ICD-10-CM | POA: Diagnosis not present

## 2015-02-26 DIAGNOSIS — E1122 Type 2 diabetes mellitus with diabetic chronic kidney disease: Secondary | ICD-10-CM | POA: Diagnosis present

## 2015-02-26 DIAGNOSIS — D509 Iron deficiency anemia, unspecified: Secondary | ICD-10-CM | POA: Diagnosis present

## 2015-02-26 DIAGNOSIS — K219 Gastro-esophageal reflux disease without esophagitis: Secondary | ICD-10-CM | POA: Diagnosis present

## 2015-02-26 DIAGNOSIS — F329 Major depressive disorder, single episode, unspecified: Secondary | ICD-10-CM | POA: Diagnosis present

## 2015-02-26 DIAGNOSIS — I272 Pulmonary hypertension, unspecified: Secondary | ICD-10-CM | POA: Diagnosis present

## 2015-02-26 DIAGNOSIS — N390 Urinary tract infection, site not specified: Secondary | ICD-10-CM | POA: Diagnosis present

## 2015-02-26 DIAGNOSIS — E119 Type 2 diabetes mellitus without complications: Secondary | ICD-10-CM

## 2015-02-26 DIAGNOSIS — R112 Nausea with vomiting, unspecified: Secondary | ICD-10-CM | POA: Diagnosis not present

## 2015-02-26 DIAGNOSIS — E785 Hyperlipidemia, unspecified: Secondary | ICD-10-CM | POA: Diagnosis present

## 2015-02-26 DIAGNOSIS — Z9981 Dependence on supplemental oxygen: Secondary | ICD-10-CM | POA: Diagnosis not present

## 2015-02-26 DIAGNOSIS — B9620 Unspecified Escherichia coli [E. coli] as the cause of diseases classified elsewhere: Secondary | ICD-10-CM | POA: Diagnosis present

## 2015-02-26 DIAGNOSIS — I5032 Chronic diastolic (congestive) heart failure: Secondary | ICD-10-CM | POA: Diagnosis present

## 2015-02-26 DIAGNOSIS — Z90710 Acquired absence of both cervix and uterus: Secondary | ICD-10-CM | POA: Diagnosis not present

## 2015-02-26 DIAGNOSIS — Z7982 Long term (current) use of aspirin: Secondary | ICD-10-CM

## 2015-02-26 DIAGNOSIS — G20 Parkinson's disease: Secondary | ICD-10-CM | POA: Diagnosis present

## 2015-02-26 DIAGNOSIS — Z7902 Long term (current) use of antithrombotics/antiplatelets: Secondary | ICD-10-CM

## 2015-02-26 DIAGNOSIS — I25119 Atherosclerotic heart disease of native coronary artery with unspecified angina pectoris: Secondary | ICD-10-CM | POA: Diagnosis not present

## 2015-02-26 DIAGNOSIS — Z96649 Presence of unspecified artificial hip joint: Secondary | ICD-10-CM | POA: Diagnosis present

## 2015-02-26 DIAGNOSIS — I129 Hypertensive chronic kidney disease with stage 1 through stage 4 chronic kidney disease, or unspecified chronic kidney disease: Secondary | ICD-10-CM | POA: Diagnosis present

## 2015-02-26 DIAGNOSIS — I10 Essential (primary) hypertension: Secondary | ICD-10-CM | POA: Diagnosis present

## 2015-02-26 DIAGNOSIS — R1084 Generalized abdominal pain: Secondary | ICD-10-CM | POA: Diagnosis not present

## 2015-02-26 DIAGNOSIS — Z79899 Other long term (current) drug therapy: Secondary | ICD-10-CM | POA: Diagnosis not present

## 2015-02-26 DIAGNOSIS — D649 Anemia, unspecified: Secondary | ICD-10-CM | POA: Diagnosis present

## 2015-02-26 DIAGNOSIS — I482 Chronic atrial fibrillation, unspecified: Secondary | ICD-10-CM | POA: Diagnosis present

## 2015-02-26 DIAGNOSIS — D638 Anemia in other chronic diseases classified elsewhere: Secondary | ICD-10-CM | POA: Diagnosis not present

## 2015-02-26 DIAGNOSIS — Z9049 Acquired absence of other specified parts of digestive tract: Secondary | ICD-10-CM | POA: Diagnosis present

## 2015-02-26 DIAGNOSIS — L8990 Pressure ulcer of unspecified site, unspecified stage: Secondary | ICD-10-CM

## 2015-02-26 LAB — COMPREHENSIVE METABOLIC PANEL
ALT: 10 U/L — ABNORMAL LOW (ref 14–54)
ANION GAP: 9 (ref 5–15)
AST: 17 U/L (ref 15–41)
Albumin: 3.9 g/dL (ref 3.5–5.0)
Alkaline Phosphatase: 67 U/L (ref 38–126)
BUN: 23 mg/dL — AB (ref 6–20)
CHLORIDE: 108 mmol/L (ref 101–111)
CO2: 24 mmol/L (ref 22–32)
Calcium: 9.4 mg/dL (ref 8.9–10.3)
Creatinine, Ser: 1.28 mg/dL — ABNORMAL HIGH (ref 0.44–1.00)
GFR calc Af Amer: 42 mL/min — ABNORMAL LOW (ref >60)
GFR, EST NON AFRICAN AMERICAN: 36 mL/min — AB (ref >60)
Glucose, Bld: 160 mg/dL — ABNORMAL HIGH (ref 65–99)
POTASSIUM: 3.4 mmol/L — AB (ref 3.5–5.1)
Sodium: 141 mmol/L (ref 135–145)
Total Bilirubin: 0.5 mg/dL (ref 0.3–1.2)
Total Protein: 7.3 g/dL (ref 6.5–8.1)

## 2015-02-26 LAB — URINALYSIS, ROUTINE W REFLEX MICROSCOPIC
Bilirubin Urine: NEGATIVE
GLUCOSE, UA: NEGATIVE mg/dL
Hgb urine dipstick: NEGATIVE
Ketones, ur: NEGATIVE mg/dL
NITRITE: NEGATIVE
PH: 5 (ref 5.0–8.0)
Protein, ur: NEGATIVE mg/dL
Specific Gravity, Urine: 1.016 (ref 1.005–1.030)
Urobilinogen, UA: 0.2 mg/dL (ref 0.0–1.0)

## 2015-02-26 LAB — CBC WITH DIFFERENTIAL/PLATELET
Basophils Absolute: 0.1 10*3/uL (ref 0.0–0.1)
Basophils Relative: 1 %
Eosinophils Absolute: 0.2 10*3/uL (ref 0.0–0.7)
Eosinophils Relative: 4 %
HEMATOCRIT: 32.1 % — AB (ref 36.0–46.0)
HEMOGLOBIN: 10.7 g/dL — AB (ref 12.0–15.0)
LYMPHS ABS: 1.3 10*3/uL (ref 0.7–4.0)
LYMPHS PCT: 23 %
MCH: 34.3 pg — AB (ref 26.0–34.0)
MCHC: 33.3 g/dL (ref 30.0–36.0)
MCV: 102.9 fL — AB (ref 78.0–100.0)
Monocytes Absolute: 0.5 10*3/uL (ref 0.1–1.0)
Monocytes Relative: 9 %
NEUTROS ABS: 3.5 10*3/uL (ref 1.7–7.7)
NEUTROS PCT: 63 %
Platelets: 182 10*3/uL (ref 150–400)
RBC: 3.12 MIL/uL — AB (ref 3.87–5.11)
RDW: 13.8 % (ref 11.5–15.5)
WBC: 5.5 10*3/uL (ref 4.0–10.5)

## 2015-02-26 LAB — URINE MICROSCOPIC-ADD ON

## 2015-02-26 MED ORDER — DORZOLAMIDE HCL 2 % OP SOLN
1.0000 [drp] | Freq: Three times a day (TID) | OPHTHALMIC | Status: DC
  Administered 2015-02-26 – 2015-02-28 (×6): 1 [drp] via OPHTHALMIC
  Filled 2015-02-26: qty 10

## 2015-02-26 MED ORDER — ENOXAPARIN SODIUM 30 MG/0.3ML ~~LOC~~ SOLN
30.0000 mg | SUBCUTANEOUS | Status: DC
  Filled 2015-02-26: qty 0.3

## 2015-02-26 MED ORDER — FLUTICASONE PROPIONATE 50 MCG/ACT NA SUSP
2.0000 | Freq: Every day | NASAL | Status: DC
  Administered 2015-02-26 – 2015-02-27 (×2): 2 via NASAL
  Filled 2015-02-26: qty 16

## 2015-02-26 MED ORDER — ESCITALOPRAM OXALATE 10 MG PO TABS
10.0000 mg | ORAL_TABLET | Freq: Every day | ORAL | Status: DC
  Administered 2015-02-27 – 2015-02-28 (×2): 10 mg via ORAL
  Filled 2015-02-26 (×2): qty 1

## 2015-02-26 MED ORDER — ACETAMINOPHEN 650 MG RE SUPP: 650.0000 mg | Freq: Four times a day (QID) | RECTAL | Status: DC | PRN

## 2015-02-26 MED ORDER — POTASSIUM CHLORIDE CRYS ER 20 MEQ PO TBCR
20.0000 meq | EXTENDED_RELEASE_TABLET | Freq: Every day | ORAL | Status: DC
  Filled 2015-02-26: qty 1

## 2015-02-26 MED ORDER — ACETAMINOPHEN 325 MG PO TABS
650.0000 mg | ORAL_TABLET | Freq: Four times a day (QID) | ORAL | Status: DC | PRN
  Administered 2015-02-26 – 2015-02-27 (×2): 650 mg via ORAL
  Filled 2015-02-26 (×2): qty 2

## 2015-02-26 MED ORDER — ASPIRIN 81 MG PO CHEW
81.0000 mg | CHEWABLE_TABLET | Freq: Every day | ORAL | Status: DC
  Administered 2015-02-26 – 2015-02-28 (×3): 81 mg via ORAL
  Filled 2015-02-26 (×3): qty 1

## 2015-02-26 MED ORDER — LATANOPROST 0.005 % OP SOLN
1.0000 [drp] | Freq: Every day | OPHTHALMIC | Status: DC
  Administered 2015-02-26 – 2015-02-27 (×2): 1 [drp] via OPHTHALMIC
  Filled 2015-02-26: qty 2.5

## 2015-02-26 MED ORDER — TRAZODONE 25 MG HALF TABLET
25.0000 mg | ORAL_TABLET | Freq: Every day | ORAL | Status: DC
  Administered 2015-02-26 – 2015-02-27 (×2): 25 mg via ORAL
  Filled 2015-02-26 (×3): qty 1

## 2015-02-26 MED ORDER — ONDANSETRON HCL 4 MG/2ML IJ SOLN: 4.0000 mg | Freq: Four times a day (QID) | INTRAMUSCULAR | Status: DC | PRN

## 2015-02-26 MED ORDER — LIDOCAINE 5 % EX PTCH
1.0000 | MEDICATED_PATCH | CUTANEOUS | Status: DC
  Administered 2015-02-27 – 2015-02-28 (×2): 1 via TRANSDERMAL
  Filled 2015-02-26 (×2): qty 1

## 2015-02-26 MED ORDER — BACLOFEN 5 MG HALF TABLET
5.0000 mg | ORAL_TABLET | Freq: Every day | ORAL | Status: DC
  Administered 2015-02-26 – 2015-02-27 (×2): 5 mg via ORAL
  Filled 2015-02-26 (×3): qty 1

## 2015-02-26 MED ORDER — CARVEDILOL 3.125 MG PO TABS
3.1250 mg | ORAL_TABLET | Freq: Every day | ORAL | Status: DC
  Administered 2015-02-26 – 2015-02-28 (×3): 3.125 mg via ORAL
  Filled 2015-02-26 (×3): qty 1

## 2015-02-26 MED ORDER — ENOXAPARIN SODIUM 40 MG/0.4ML ~~LOC~~ SOLN
40.0000 mg | SUBCUTANEOUS | Status: DC
  Filled 2015-02-26: qty 0.4

## 2015-02-26 MED ORDER — CLOPIDOGREL BISULFATE 75 MG PO TABS
75.0000 mg | ORAL_TABLET | Freq: Every day | ORAL | Status: DC
  Administered 2015-02-26 – 2015-02-28 (×3): 75 mg via ORAL
  Filled 2015-02-26 (×3): qty 1

## 2015-02-26 MED ORDER — BISACODYL 10 MG RE SUPP: 10.0000 mg | RECTAL | Status: DC | PRN

## 2015-02-26 MED ORDER — SODIUM CHLORIDE 0.9 % IV SOLN
INTRAVENOUS | Status: AC
  Administered 2015-02-26: via INTRAVENOUS

## 2015-02-26 MED ORDER — AMLODIPINE BESYLATE 2.5 MG PO TABS
2.5000 mg | ORAL_TABLET | Freq: Every day | ORAL | Status: DC
  Administered 2015-02-27 – 2015-02-28 (×2): 2.5 mg via ORAL
  Filled 2015-02-26 (×2): qty 1

## 2015-02-26 MED ORDER — METOCLOPRAMIDE HCL 5 MG/ML IJ SOLN
5.0000 mg | Freq: Four times a day (QID) | INTRAMUSCULAR | Status: DC
  Administered 2015-02-26 – 2015-02-28 (×5): 5 mg via INTRAVENOUS
  Filled 2015-02-26 (×3): qty 1
  Filled 2015-02-26: qty 2
  Filled 2015-02-26 (×6): qty 1

## 2015-02-26 MED ORDER — ONDANSETRON HCL 4 MG PO TABS: 4.0000 mg | ORAL_TABLET | Freq: Four times a day (QID) | ORAL | Status: DC | PRN

## 2015-02-26 MED ORDER — INFLUENZA VAC SPLIT QUAD 0.5 ML IM SUSY
0.5000 mL | PREFILLED_SYRINGE | INTRAMUSCULAR | Status: DC
  Filled 2015-02-26 (×2): qty 0.5

## 2015-02-26 MED ORDER — FERROUS SULFATE 325 (65 FE) MG PO TABS
325.0000 mg | ORAL_TABLET | Freq: Every day | ORAL | Status: DC
  Administered 2015-02-27 – 2015-02-28 (×2): 325 mg via ORAL
  Filled 2015-02-26 (×3): qty 1

## 2015-02-26 NOTE — H&P (Signed)
PCP:  [name], DO . [facility] Dr. Deatra Ina  Referring provider Alvino Chapel   Chief Complaint:   Nausea and vomiting   HPI: Maria Christian is a 81 y.o. female   has a past medical history of Atrial fibrillation; Altered mental status; Encephalopathy; Parkinson disease; Hypertension; GERD (gastroesophageal reflux disease); Hyperlipemia; Diabetes mellitus; Coronary artery disease; History of adenomatous polyp of colon (06/24/99); Enlarged heart; DEMENTIA; Edema of lower extremity (07/13/11); Esophageal dysmotility (07/02/12); Horseshoe kidney; Pancreatic lesion (05/22/11); Anemia; Peripheral neuropathy; Depression; Thrombophlebitis; TIA (transient ischemic attack) (12/19/2012); and Sigmoid volvulus (05/24/2013).   Presented with patient have had prolonged episodes of nausea and vomiting at least since the first week of September. And also have had an intermittent right-sided abdominal pain. Patient currently resides at [facility], she has been complaining of some headache for the past 2 weeks. Per notes patient have had history of recent urinary tract infection reportedly was treated.  Patient have had decreased/poor fluid intake does not endorse any diarrhea but has had intermittent nausea and vomiting. At the nursing home she was checked for impaction which was not found. She been having some abdominal pain. Patient was transferred to emergency department. CT scan of his contrast was done showed dilated air-filled sigmoid colon most consistent with ileus no evidence of volvulus or obstruction. Patient was found to have lower extremity swelling and Doppler was done which did not show any evidence of DVT. Given ongoing nausea and vomiting CT scan of the head was done which did not show any acute findings.  UA did show many white blood cells and few bacteria but negative for nitrites. Urine culture has been sent. Patient reports she has intermittent rib pain on the left side.    Patient has extensive medical problems including chronic diastolic heart failure for which she is taking Lasix and Coreg Patient has history of atrial fibrillation which is well controlled on Coreg she is also on Plavix and baby aspirin. Patient has chronic respiratory failure for which she is on chronic oxygen at home. She has chronic iron deficiency anemia which has been stable. Patient has history of diet-controlled diabetes. Hospitalist was called for admission for ileus   Review of Systems:    Pertinent positives include: abdominal pain, nausea, vomiting,  Constitutional:  No weight loss, night sweats, Fevers, chills, fatigue, weight loss  HEENT:  No headaches, Difficulty swallowing,Tooth/dental problems,Sore throat,  No sneezing, itching, ear ache, nasal congestion, post nasal drip,  Cardio-vascular:  No chest pain, Orthopnea, PND, anasarca, dizziness, palpitations.no Bilateral lower extremity swelling  GI:  No heartburn, indigestion,  diarrhea, change in bowel habits, loss of appetite, melena, blood in stool, hematemesis Resp:  no shortness of breath at rest. No dyspnea on exertion, No excess mucus, no productive cough, No non-productive cough, No coughing up of blood.No change in color of mucus.No wheezing. Skin:  no rash or lesions. No jaundice GU:  no dysuria, change in color of urine, no urgency or frequency. No straining to urinate.  No flank pain.  Musculoskeletal:  No joint pain or no joint swelling. No decreased range of motion. No back pain.  Psych:  No change in mood or affect. No depression or anxiety. No memory loss.  Neuro: no localizing neurological complaints, no tingling, no weakness, no double vision, no gait abnormality, no slurred speech, no confusion  Otherwise ROS are negative except for above, 10 systems were reviewed  Past Medical History: Past Medical History  Diagnosis Date  .  Atrial fibrillation   . Altered mental status   . Encephalopathy    . Parkinson disease   . Hypertension   . GERD (gastroesophageal reflux disease)   . Hyperlipemia   . Diabetes mellitus   . Coronary artery disease   . History of adenomatous polyp of colon 06/24/99  . Enlarged heart   . DEMENTIA   . Edema of lower extremity 07/13/11    right leg more swollen than left leg  . Esophageal dysmotility 07/02/12  . Horseshoe kidney   . Pancreatic lesion 05/22/11    no further workup  per PCP/family due to age  . Anemia   . Peripheral neuropathy   . Depression   . Thrombophlebitis   . TIA (transient ischemic attack) 12/19/2012  . Sigmoid volvulus 05/24/2013   Past Surgical History  Procedure Laterality Date  . Shoulder surgery  2001    left clavicle excision and acromioplasty  . Abdominal hysterectomy    . Cholecystectomy    . Total hip arthroplasty    . Breast surgery      2 benign tumors removed left breast  . Foot surgery      benign tumors from foot  . Esophageal dilation      several times by Dr. [name]  . Nose surgery    . Esophagogastroduodenoscopy (egd) with esophageal dilation N/A 08/01/2012    Procedure: ESOPHAGOGASTRODUODENOSCOPY (EGD) WITH ESOPHAGEAL DILATION;  Surgeon: [name], MD;  Location: [facility]COPY;  Service: Endoscopy;  Laterality: N/A;  with c-arm savory dilators  . Flexible sigmoidoscopy N/A 05/24/2013    Procedure: FLEXIBLE SIGMOIDOSCOPY;  Surgeon: [name], MD;  Location: [facility];  Service: Endoscopy;  Laterality: N/A;  . Flexible sigmoidoscopy N/A 05/26/2013    Procedure:  flex with decompression of sigmoid volvulus;  Surgeon: [name], MD;  Location: [facility]COPY;  Service: Endoscopy;  Laterality: N/A;     Medications: Prior to Admission medications   Medication Sig Start Date End Date Taking? Authorizing Provider  acetaminophen (TYLENOL) 325 MG tablet Take 2 tablets (650 mg total) by mouth every 6 (six) hours as needed for mild pain. Patient taking differently: Take 650 mg by mouth 2 (two)  times daily.  11/03/14  Yes Gerlene Fee, NP  amLODipine (NORVASC) 2.5 MG tablet Take 1 tablet (2.5 mg total) by mouth daily. 01/06/14  Yes Barton Dubois, MD  aspirin 81 MG chewable tablet Chew 81 mg by mouth daily.   Yes Historical Provider, MD  baclofen (LIORESAL) 10 MG tablet Take 5 mg by mouth at bedtime.   Yes Historical Provider, MD  bisacodyl (DULCOLAX) 10 MG suppository Place 10 mg rectally as needed for mild constipation or moderate constipation.   Yes Historical Provider, MD  carvedilol (COREG) 3.125 MG tablet Take 3.125 mg by mouth daily.   Yes Historical Provider, MD  cholecalciferol (VITAMIN D) 1000 UNITS tablet Take 1,000 Units by mouth daily.   Yes Historical Provider, MD  clopidogrel (PLAVIX) 75 MG tablet Take 75 mg by mouth daily.    Yes Historical Provider, MD  Cranberry 475 MG CAPS Take 1 capsule by mouth 2 (two) times daily.   Yes Historical Provider, MD  diclofenac sodium (VOLTAREN) 1 % GEL Apply 2 g topically 2 (two) times daily as needed (left neck pain).    Yes Historical Provider, MD  dorzolamide (TRUSOPT) 2 % ophthalmic solution Place 1 drop into both eyes 3 (three) times daily.    Yes Historical Provider, MD  escitalopram (LEXAPRO) 10 MG tablet Take 1 tablet (10 mg total) by mouth daily. 12/03/14  Yes Gerlene Fee, NP  ferrous sulfate 325 (65 FE) MG tablet Take 325 mg by mouth daily with breakfast.   Yes Historical Provider, MD  fluticasone (FLONASE) 50 MCG/ACT nasal spray Place 2 sprays into both nostrils at bedtime.   Yes Historical Provider, MD  furosemide (LASIX) 40 MG tablet Take 1 tablet (40 mg total) by mouth 2 (two) times daily. 01/05/14  Yes Barton Dubois, MD  hydrALAZINE (APRESOLINE) 10 MG tablet Take 10 mg by mouth 3 (three) times daily. HOLD if SBP <100/60 and pulse is less than 60   Yes Historical Provider, MD  lidocaine (LIDODERM) 5 % Place 1 patch onto the skin daily. Apply 1 patch to left lower back/hip every morning.  Remove & Discard patch within 12 hours  or as directed by MD 07/28/13  Yes Thurnell Lose, MD  Linaclotide [facility]) 145 MCG CAPS capsule Take 145 mcg by mouth daily.   Yes Historical Provider, MD  loratadine (CLARITIN) 10 MG tablet Take 10 mg by mouth daily.   Yes Historical Provider, MD  Multiple Vitamins-Minerals (CERTAGEN PO) Take 1 tablet by mouth daily.    Yes Historical Provider, MD  NON FORMULARY Take 90 mLs by mouth 2 (two) times daily. 2 cal supplement   Yes Historical Provider, MD  Polyethyl Glycol-Propyl Glycol (SYSTANE) 0.4-0.3 % SOLN Place 1 drop into both eyes 4 (four) times daily as needed (dry eyes).   Yes Historical Provider, MD  potassium chloride SA (K-DUR,KLOR-CON) 20 MEQ tablet Take 20 mEq by mouth daily. 10/23/13  Yes Janece Canterbury, MD  sodium chloride (OCEAN) 0.65 % SOLN nasal spray Place 1 spray into both nostrils 4 (four) times daily. And every 4 hours as needed   Yes Historical Provider, MD  Travoprost, BAK Free, (TRAVATAN) 0.004 % SOLN ophthalmic solution Place 1 drop into both eyes at bedtime.    Yes Historical Provider, MD  traZODone (DESYREL) 50 MG tablet Take 0.5 tablets (25 mg total) by mouth at bedtime as needed for sleep. Patient taking differently: Take 25 mg by mouth at bedtime.  11/03/14  Yes Gerlene Fee, NP  vitamin B-12 (CYANOCOBALAMIN) 500 MCG tablet Take 500 mcg by mouth daily.   Yes Historical Provider, MD  vitamin C (ASCORBIC ACID) 500 MG tablet Take 500 mg by mouth daily.   Yes Historical Provider, MD  amoxicillin-clavulanate (AUGMENTIN) 875-125 MG per tablet Take 1 tablet by mouth 2 (two) times daily. 02/17/15   Historical Provider, MD  saccharomyces boulardii (FLORASTOR) 250 MG capsule Take 250 mg by mouth 2 (two) times daily. For 7 days to prevent gi symptoms    Historical Provider, MD  senna (SENOKOT) 8.6 MG TABS tablet Take 1 tablet (8.6 mg total) by mouth 2 (two) times daily. 01/05/14   Barton Dubois, MD    Allergies:   Allergies  Allergen Reactions  . Aspirin Other (See Comments)      G.I. Upset only    Social History:  Ambulatory  With walker  From facility [facility]   reports that she quit smoking about 50 years ago. She has never used smokeless tobacco. She reports that she does not drink alcohol or use illicit drugs.    Family History: family history includes Cancer in an other family member; Heart disease in an other family member.    Physical Exam: Patient Vitals for the past 24 hrs:  BP Temp Temp src  Pulse Resp SpO2  02/26/15 1847 (!) 136/47 mmHg - - 76 16 99 %  02/26/15 1653 111/85 mmHg - - 64 15 100 %  02/26/15 1453 (!) 128/51 mmHg - Oral 65 14 98 %  02/26/15 1214 (!) 113/49 mmHg 99.1 F (37.3 C) Oral (!) 59 16 100 %    1. General:  in No Acute distress 2. Psychological: Alert and  Oriented 3. Head/ENT:    Dry Mucous Membranes                          Head Non traumatic, neck supple                           Poor Dentition 4. SKIN:   decreased Skin turgor,  Skin clean Dry and intact no rash 5. Heart: Regular rate and rhythm no Murmur, Rub or gallop 6. Lungs: Clear to auscultation bilaterally, no wheezes or crackles , Left side   rib  tenderness 7. Abdomen: Soft, non-tender, Non distended 8. Lower extremities: no clubbing, cyanosis, right worse than left edema 9. Neurologically Grossly intact, moving all 4 extremities equally 10. MSK: Normal range of motion  body mass index is unknown because there is no weight on file.   Labs on Admission:   Results for orders placed or performed during the hospital encounter of 02/26/15 (from the past 24 hour(s))  Comprehensive metabolic panel     Status: Abnormal   Collection Time: 02/26/15 12:57 PM  Result Value Ref Range   Sodium 141 135 - 145 mmol/L   Potassium 3.4 (L) 3.5 - 5.1 mmol/L   Chloride 108 101 - 111 mmol/L   CO2 24 22 - 32 mmol/L   Glucose, Bld 160 (H) 65 - 99 mg/dL   BUN 23 (H) 6 - 20 mg/dL   Creatinine, Ser 1.28 (H) 0.44 - 1.00 mg/dL   Calcium 9.4 8.9 - 10.3 mg/dL   Total  Protein 7.3 6.5 - 8.1 g/dL   Albumin 3.9 3.5 - 5.0 g/dL   AST 17 15 - 41 U/L   ALT 10 (L) 14 - 54 U/L   Alkaline Phosphatase 67 38 - 126 U/L   Total Bilirubin 0.5 0.3 - 1.2 mg/dL   GFR calc non Af Amer 36 (L) >60 mL/min   GFR calc Af Amer 42 (L) >60 mL/min   Anion gap 9 5 - 15  CBC with Differential     Status: Abnormal   Collection Time: 02/26/15 12:57 PM  Result Value Ref Range   WBC 5.5 4.0 - 10.5 K/uL   RBC 3.12 (L) 3.87 - 5.11 MIL/uL   Hemoglobin 10.7 (L) 12.0 - 15.0 g/dL   HCT 32.1 (L) 36.0 - 46.0 %   MCV 102.9 (H) 78.0 - 100.0 fL   MCH 34.3 (H) 26.0 - 34.0 pg   MCHC 33.3 30.0 - 36.0 g/dL   RDW 13.8 11.5 - 15.5 %   Platelets 182 150 - 400 K/uL   Neutrophils Relative % 63 %   Neutro Abs 3.5 1.7 - 7.7 K/uL   Lymphocytes Relative 23 %   Lymphs Abs 1.3 0.7 - 4.0 K/uL   Monocytes Relative 9 %   Monocytes Absolute 0.5 0.1 - 1.0 K/uL   Eosinophils Relative 4 %   Eosinophils Absolute 0.2 0.0 - 0.7 K/uL   Basophils Relative 1 %   Basophils Absolute 0.1 0.0 - 0.1 K/uL  Urinalysis,  Routine w reflex microscopic     Status: Abnormal   Collection Time: 02/26/15  2:49 PM  Result Value Ref Range   Color, Urine YELLOW YELLOW   APPearance CLOUDY (A) CLEAR   Specific Gravity, Urine 1.016 1.005 - 1.030   pH 5.0 5.0 - 8.0   Glucose, UA NEGATIVE NEGATIVE mg/dL   Hgb urine dipstick NEGATIVE NEGATIVE   Bilirubin Urine NEGATIVE NEGATIVE   Ketones, ur NEGATIVE NEGATIVE mg/dL   Protein, ur NEGATIVE NEGATIVE mg/dL   Urobilinogen, UA 0.2 0.0 - 1.0 mg/dL   Nitrite NEGATIVE NEGATIVE   Leukocytes, UA LARGE (A) NEGATIVE  Urine microscopic-add on     Status: Abnormal   Collection Time: 02/26/15  2:49 PM  Result Value Ref Range   Squamous Epithelial / LPF MANY (A) RARE   WBC, UA 21-50 <3 WBC/hpf   Bacteria, UA FEW (A) RARE    UA many squamous epithelials 21-50 white blood cells few bacteria only  Lab Results  Component Value Date   HGBA1C 6.1* 07/17/2014    CrCl cannot be calculated  (Unknown ideal weight.).  BNP (last 3 results) No results for input(s): PROBNP in the last 8760 hours.  Other results:  I have pearsonaly reviewed this: ECG REPORT not obtained   There were no vitals filed for this visit.   Cultures:    Component Value Date/Time   SDES BLOOD LEFT HAND 07/24/2013 0840   SPECREQUEST BOTTLES DRAWN AEROBIC AND ANAEROBIC 3CC 07/24/2013 0840   CULT  07/24/2013 0840    NO GROWTH 5 DAYS Performed at [place] 07/30/2013 FINAL 07/24/2013 0840     Radiological Exams on Admission: Ct Abdomen Pelvis Wo Contrast  02/26/2015   CLINICAL DATA:  Acute generalized abdominal pain for 2 weeks.  EXAM: CT ABDOMEN AND PELVIS WITHOUT CONTRAST  TECHNIQUE: Multidetector CT imaging of the abdomen and pelvis was performed following the standard protocol without IV contrast.  COMPARISON:  CT scan of December 27, 2013.  FINDINGS: Visualized lung bases appear normal. Status post left hip arthroplasty.  Status post cholecystectomy. No focal abnormality is noted in the liver, spleen or pancreas is noted on these unenhanced images. Atherosclerosis of abdominal aorta is noted without aneurysm formation. Horseshoe configuration of the kidneys is noted. Adrenal glands appear normal. No hydronephrosis or renal obstruction is noted. No renal or ureteral calculi are noted. Mildly dilated sigmoid colon is noted which appears to be due to ileus. No volvulus is noted. No abnormal fluid collection is noted. Urinary bladder appears normal. Status post hysterectomy. Ovaries appear normal. No significant adenopathy is noted.  IMPRESSION: Atherosclerosis of abdominal aorta is noted without aneurysm formation.  Horseshoe configuration of the kidneys is noted. No hydronephrosis or renal obstruction is noted. No renal or ureteral calculi are noted.  Dilated and air-filled sigmoid colon is noted most consistent with ileus. There is no evidence of volvulus or obstruction.   Electronically  Signed   By: Marijo Conception, M.D.   On: 02/26/2015 14:54   Dg Abd 1 View  02/26/2015   CLINICAL DATA:  Initial encounter for 2 week history of abdominal pain and distention.  EXAM: ABDOMEN - 1 VIEW  COMPARISON:  12/27/2013.  FINDINGS: Supine view the abdomen shows diffuse gaseous distention of bowel, mainly representing colon. Imaging features are similar to prior study. Surgical clips in right upper quadrant compatible with prior cholecystectomy. Bones are diffusely demineralized.  IMPRESSION: Diffuse gaseous bowel distention, mainly involving colon. Imaging features  may be related to chronic dysmotility, ileus, or evolving distal colonic obstruction.   Electronically Signed   By: Misty Stanley M.D.   On: 02/26/2015 13:44   Ct Head Wo Contrast  02/26/2015   CLINICAL DATA:  Headache x2 weeks, dementia  EXAM: CT HEAD WITHOUT CONTRAST  TECHNIQUE: Contiguous axial images were obtained from the base of the skull through the vertex without intravenous contrast.  COMPARISON:  MRI brain dated 01/12/2012  FINDINGS: No evidence of parenchymal hemorrhage or extra-axial fluid collection. No mass lesion, mass effect, or midline shift.  No CT evidence of acute infarction. Old right basal ganglia lacunar infarct (series 2/image 14).  Subcortical white matter and periventricular small vessel ischemic changes. Intracranial atherosclerosis.  Global cortical atrophy, likely age appropriate. No ventriculomegaly.  The visualized paranasal sinuses are essentially clear. The mastoid air cells are unopacified.  No evidence of calvarial fracture.  IMPRESSION: No evidence of acute intracranial abnormality.  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  Small vessel ischemic changes.   Electronically Signed   By: Julian Hy M.D.   On: 02/26/2015 13:40    Chart has been reviewed  Family not at  Bedside     Assessment/Plan  81 yo F with hx of chronic iron def. Anemia, A.fib, hx of CVA on plavix, Chronic respiratory failure on  chronic oxygen, Hx of CAD and mild Dementia. With hx of nausea and vomiting for past few weeks here with Ileus  Present on Admission:  . Ileus - will provide bowel rest. Currently no indication for NG tube placement. Patient appears to be comfortable. We will give Reglan and see if she improves  . Anemia chronic continue to monitor patient is on iron this can be restarted at discharge  . Atrial fibrillation with controlled ventricular response currently rates stable continue Coreg  . Chronic pain - continue lidocaine patch and pain medication   . Chronic respiratory failure - continue oxygen  . Coronary artery disease - stable currently does not endorse any chest pain Coreg and aspirin  . Dementia stable currently appears to have no behavioral disc  . Diastolic CHF, chronic given poor by mouth intake nausea and vomiting will hold off on Lasix monitor creatinine and give gentle fluids appears to be actually fluid down  . Hypertension -continue home medication Norvasc and monitor  . Hypokalemia- restart home potassium  . Malnutrition of moderate degree check prealbumin and if needed obtain nutritional consult   diabetes mellitus stable diet controlled - will monitor blood sugars with order sliding scale   Prophylaxis:  Lovenox   CODE STATUS: DNR/DNI as per patient   Disposition:                            Back to current facility when stable                           Other plan as per orders.  I have spent a total of 55 min on this admission  DOUTOVA,ANASTASSIA 02/26/2015, 7:16 PM  [facility]  Pager [phone]   after 2 AM please page floor coverage PA If 7AM-7PM, please contact the day team taking care of the patient  [email or web address]  Password [email or web address]

## 2015-02-26 NOTE — Progress Notes (Signed)
Patient ID: Maria Christian, female   DOB: 06-Apr-1925, 81 y.o.   MRN: [id number]    Facility: [facility]      Allergies  Allergen Reactions  . Aspirin Other (See Comments)    [name]    Chief Complaint  Patient presents with  . Medical Management of Chronic Issues    HPI:  She is a long term resident of this facility being seen for the management of her chronic illnesses. Her status remains stable. She is not voicing any complaints of pain or discomfort. There are no nursing concerns at this time   Past Medical History  Diagnosis Date  . Atrial fibrillation   . Altered mental status   . Encephalopathy   . Parkinson disease   . Hypertension   . GERD (gastroesophageal reflux disease)   . Hyperlipemia   . Diabetes mellitus   . Coronary artery disease   . History of adenomatous polyp of colon 06/24/99  . Enlarged heart   . DEMENTIA   . Edema of lower extremity 07/13/11    right leg more swollen than left leg  . Esophageal dysmotility 07/02/12  . Horseshoe kidney   . Pancreatic lesion 05/22/11    no further workup  per PCP/family due to age  . Anemia   . Peripheral neuropathy   . Depression   . Thrombophlebitis   . TIA (transient ischemic attack) 12/19/2012  . Sigmoid volvulus 05/24/2013    Past Surgical History  Procedure Laterality Date  . Shoulder surgery  2001    left clavicle excision and acromioplasty  . Abdominal hysterectomy    . Cholecystectomy    . Total hip arthroplasty    . Breast surgery      2 benign tumors removed left breast  . Foot surgery      benign tumors from foot  . Esophageal dilation      several times by Dr. [name]  . Nose surgery    . Esophagogastroduodenoscopy (egd) with esophageal dilation N/A 08/01/2012    Procedure: ESOPHAGOGASTRODUODENOSCOPY (EGD) WITH ESOPHAGEAL DILATION;  Surgeon: [name], MD;  Location: [facility];  Service: Endoscopy;  Laterality: N/A;  with c-arm savory dilators  . Flexible  sigmoidoscopy N/A 05/24/2013    Procedure: FLEXIBLE SIGMOIDOSCOPY;  Surgeon: [name], MD;  Location: [facility];  Service: Endoscopy;  Laterality: N/A;  . Flexible sigmoidoscopy N/A 05/26/2013    Procedure:  flex with decompression of sigmoid volvulus;  Surgeon: [name], MD;  Location: [facility];  Service: Endoscopy;  Laterality: N/A;    VITAL SIGNS BP 113/50 mmHg  Pulse 69  Ht 5\' 7"  (1.702 m)  Wt 158 lb (71.668 kg)  BMI 24.74 kg/m2  SpO2 97%  Patient's Medications  New Prescriptions   No medications on file  Previous Medications   ACETAMINOPHEN (TYLENOL) 325 MG TABLET    Take 2 tablets (650 mg total) by mouth every 6 (six) hours as needed for mild pain.   AMLODIPINE (NORVASC) 2.5 MG TABLET    Take 1 tablet (2.5 mg total) by mouth daily.   ASPIRIN 81 MG CHEWABLE TABLET    Chew 81 mg by mouth daily.   BACLOFEN (LIORESAL) 10 MG TABLET    Take 5 mg by mouth at bedtime.   CARVEDILOL (COREG) 3.125 MG TABLET    Take 3.125 mg by mouth daily.   CHOLECALCIFEROL (VITAMIN D) 1000 UNITS TABLET    Take 1,000 Units by mouth daily.  CLOPIDOGREL (PLAVIX) 75 MG TABLET    Take 75 mg by mouth daily.    CRANBERRY 475 MG CAPS    Take 1 capsule by mouth 2 (two) times daily.   DICLOFENAC SODIUM (VOLTAREN) 1 % GEL    Apply 2 g topically 2 (two) times daily as needed (left neck pain).    DORZOLAMIDE (TRUSOPT) 2 % OPHTHALMIC SOLUTION    Place 1 drop into both eyes 3 (three) times daily.    ESCITALOPRAM (LEXAPRO) 10 MG TABLET    Take 1 tablet (10 mg total) by mouth daily.   FERROUS SULFATE 325 (65 FE) MG TABLET    Take 325 mg by mouth daily with breakfast.   FUROSEMIDE (LASIX) 40 MG TABLET    Take 1 tablet (40 mg total) by mouth 2 (two) times daily.   HYDRALAZINE (APRESOLINE) 10 MG TABLET    Take 10 mg by mouth 3 (three) times daily. HOLD if SBP <100/60 and pulse is less than 60   LIDOCAINE (LIDODERM) 5 %    Place 1 patch onto the skin daily. Apply 1 patch to left lower back/hip every  morning.  Remove & Discard patch within 12 hours or as directed by MD   LORATADINE (CLARITIN) 10 MG TABLET    Take 10 mg by mouth daily.   MULTIPLE VITAMINS-MINERALS (CERTAGEN PO)    Take 1 tablet by mouth daily.    NON FORMULARY    Take 90 mLs by mouth 2 (two) times daily. 2 cal supplement   POLYETHYL GLYCOL-PROPYL GLYCOL (SYSTANE) 0.4-0.3 % SOLN    Place 1 drop into both eyes 4 (four) times daily as needed (dry eyes).   POTASSIUM CHLORIDE SA (K-DUR,KLOR-CON) 20 MEQ TABLET    Take 20 mEq by mouth daily.   SENNA (SENOKOT) 8.6 MG TABS TABLET    Take 1 tablet (8.6 mg total) by mouth 2 (two) times daily.   SODIUM CHLORIDE (OCEAN) 0.65 % SOLN NASAL SPRAY    Place 1 spray into both nostrils 4 (four) times daily. And every 4 hours as needed   TRAVOPROST, BAK FREE, (TRAVATAN) 0.004 % SOLN OPHTHALMIC SOLUTION    Place 2 drops into both eyes at bedtime.    TRAZODONE (DESYREL) 50 MG TABLET    Take 0.5 tablets (25 mg total) by mouth at bedtime as needed for sleep.   VITAMIN B-12 (CYANOCOBALAMIN) 500 MCG TABLET    Take 500 mcg by mouth daily.   VITAMIN C (ASCORBIC ACID) 500 MG TABLET    Take 500 mg by mouth daily.  Modified Medications   No medications on file  Discontinued Medications   No medications on file     SIGNIFICANT DIAGNOSTIC EXAMS   07-16-14: right hand x-ray: no acute abnormalities   10-13-14: chest x-ray: no acute cardiopulmonary process present.   12-16-14: chest x-ray: moderate cardiomegaly; with mild pulmonary venous congestion  12-21-14: chest x-ray: no acute cardiopulmonary process    LABS REVIEWED:   03-19-14: wbc 3.7; hgb 9.9; hct 33.2; mcv 108.5; plt 121; glucose 122; bun 25.7; creat 1.3; k+4.1; na++139; liver normal albumin 4.3 07-17-14: wbc 3.8; hgb 9.7; ;hct 31.;2 mcv 108; plt 113; glucose 154; bun 21.3; creat 1.43;  Liver normal albumin 4.0; tsh 6.13; hgb a1c 6.1; uric acid 7.1  08-18-14: tsh 4.09  10-13-14: wbc 4.7; hgb 9.7; hct 31.1; mcv 105.3; plt 111; glucose 176; bun  27.7; creat 1.44; k+4.4; na++143; C02: 35; liver normal albumin 4.5 12-21-14: wbc 5.1; hgb 9.7; hct 31.2;  mcv 106.7; plt 129; glucose 217; bun 27.6; creat 1.37; k+ 3.8; na++135   Review of Systems Constitutional: Negative for malaise/fatigue.  Respiratory: Negative for cough and shortness of breath.   Cardiovascular:  Negative for chest pain and leg swelling.  Gastrointestinal: Negative for heartburn, abdominal pain and constipation.  Musculoskeletal:  Negative for myalgias and joint pain.    Skin: Negative.   Neurological: Negative for headaches.  Psychiatric/Behavioral: Negative for depression. The patient is not nervous/anxious.       Physical Exam Constitutional: She appears well-developed and well-nourished. No distress.  Neck: Neck supple. No JVD present. No thyromegaly present.  Cardiovascular: Normal rate and intact distal pulses.   Heart rate irregular   Respiratory: Effort normal and breath sounds normal. No respiratory distress.  GI: Soft. Bowel sounds are normal. There is no tenderness.  Musculoskeletal: She exhibits no edema.  Is able to move all extremities   Neurological: She is alert.  Skin: Skin is warm and dry. She is not diaphoretic.      ASSESSMENT/ PLAN:  1. Chronic diastolic heart failure: is presently stable will continue daily weights; will continue lasix 40 mg twice daily with k+ 20 meq daily hydralazine 10 mg three times daily coreg 3.125 mg daily and will monitor her status.   2. Afib: her heart rate is stable will continue coreg 3.125 mg daily for rate control; wll continue plavix 75 mg daily and asa 81 mg daily will monitor  3. Hypertension: is stable will continue norvasc 2.5 mg daily; coreg 3.125 mg daily; hydralazine 10 mg three times daily;  Will monitor   4. Chronic respiratory failure: is stable on chronic 02;  will monitor   5. Anemia: hgb is 9.7; will continue iron daily   6. Dyslipidemia: is presently not on medications; will monitor  her status.    7. Allergic rhinitis: will continue claritin daily   8. Constipation: will continue senna twice daily   9. Depression: is stable; is followed by [facility] services; will continue lexapro 10 mg daily   10. Dementia: is without change is presently off medications; will not make changes    11.  Chronic pain due to osteoarthritis: her pain is presently managed:will continue   voltaren gel 2 gm to neck twice daily as needed; lidoderm to lower back; will continue tylenol 650 mg twice daily; and will continue baclofen 5 mg nightly for leg spasticity and will monitor   12. Glaucoma: will continue travoprost to both eyes nightly; trusopt to both eyes three times daily    Will check lipids and hgb a1c     Ok Edwards NP [facility]  Contact [phone] Monday through Friday 8am- 5pm  After hours call [phone]

## 2015-02-26 NOTE — ED Notes (Addendum)
[name] harmon-daughter [phone] would like to be called when pt has a room and informed of room number

## 2015-02-26 NOTE — Progress Notes (Signed)
Patient ID: Maria Christian, female   DOB: 05-12-1925, 81 y.o.   MRN: [id number]    Facility: [facility]      Allergies  Allergen Reactions  . Aspirin Other (See Comments)    [name] only    Chief Complaint  Patient presents with  . Medical Management of Chronic Issues    HPI:  She is a long term resident of this facility being seen for the management of her chronic illnesses. She is not voicing any complaints or concerns at this time. There are no nursing concerns at this time.     Past Medical History  Diagnosis Date  . Atrial fibrillation   . Altered mental status   . Encephalopathy   . Parkinson disease   . Hypertension   . GERD (gastroesophageal reflux disease)   . Hyperlipemia   . Diabetes mellitus   . Coronary artery disease   . History of adenomatous polyp of colon 06/24/99  . Enlarged heart   . DEMENTIA   . Edema of lower extremity 07/13/11    right leg more swollen than left leg  . Esophageal dysmotility 07/02/12  . Horseshoe kidney   . Pancreatic lesion 05/22/11    no further workup  per PCP/family due to age  . Anemia   . Peripheral neuropathy   . Depression   . Thrombophlebitis   . TIA (transient ischemic attack) 12/19/2012  . Sigmoid volvulus 05/24/2013    Past Surgical History  Procedure Laterality Date  . Shoulder surgery  2001    left clavicle excision and acromioplasty  . Abdominal hysterectomy    . Cholecystectomy    . Total hip arthroplasty    . Breast surgery      2 benign tumors removed left breast  . Foot surgery      benign tumors from foot  . Esophageal dilation      several times by Dr. [name]  . Nose surgery    . Esophagogastroduodenoscopy (egd) with esophageal dilation N/A 08/01/2012    Procedure: ESOPHAGOGASTRODUODENOSCOPY (EGD) WITH ESOPHAGEAL DILATION;  Surgeon: [name], MD;  Location: [facility];  Service: Endoscopy;  Laterality: N/A;  with c-arm savory dilators  . Flexible sigmoidoscopy N/A  05/24/2013    Procedure: FLEXIBLE SIGMOIDOSCOPY;  Surgeon: [name], MD;  Location: [facility];  Service: Endoscopy;  Laterality: N/A;  . Flexible sigmoidoscopy N/A 05/26/2013    Procedure:  flex with decompression of sigmoid volvulus;  Surgeon: [name], MD;  Location: [facility];  Service: Endoscopy;  Laterality: N/A;    VITAL SIGNS BP 130/56 mmHg  Pulse 76  Ht 5\' 7"  (1.702 m)  Wt 161 lb (73.029 kg)  BMI 25.21 kg/m2  SpO2 96%  Patient's Medications  New Prescriptions   No medications on file  Previous Medications   ACETAMINOPHEN (TYLENOL) 325 MG TABLET    Take 2 tablets (650 mg total) by mouth every 6 (six) hours as needed for mild pain.   AMLODIPINE (NORVASC) 2.5 MG TABLET    Take 1 tablet (2.5 mg total) by mouth daily.   ASPIRIN 81 MG CHEWABLE TABLET    Chew 81 mg by mouth daily.   BACLOFEN (LIORESAL) 10 MG TABLET    Take 5 mg by mouth at bedtime.   CARVEDILOL (COREG) 3.125 MG TABLET    Take 3.125 mg by mouth daily.   CHOLECALCIFEROL (VITAMIN D) 1000 UNITS TABLET    Take 1,000 Units by mouth daily.  CLOPIDOGREL (PLAVIX) 75 MG TABLET    Take 75 mg by mouth daily.    CRANBERRY 475 MG CAPS    Take 1 capsule by mouth 2 (two) times daily.   DICLOFENAC SODIUM (VOLTAREN) 1 % GEL    Apply 2 g topically 2 (two) times daily as needed (left neck pain).    DORZOLAMIDE (TRUSOPT) 2 % OPHTHALMIC SOLUTION    Place 1 drop into both eyes 3 (three) times daily.    ESCITALOPRAM (LEXAPRO) 10 MG TABLET    Take 1 tablet (10 mg total) by mouth daily.   FERROUS SULFATE 325 (65 FE) MG TABLET    Take 325 mg by mouth daily with breakfast.   FUROSEMIDE (LASIX) 40 MG TABLET    Take 1 tablet (40 mg total) by mouth 2 (two) times daily.   HYDRALAZINE (APRESOLINE) 10 MG TABLET    Take 10 mg by mouth 3 (three) times daily. HOLD if SBP <100/60 and pulse is less than 60   LIDOCAINE (LIDODERM) 5 %    Place 1 patch onto the skin daily. Apply 1 patch to left lower back/hip every morning.  Remove &  Discard patch within 12 hours or as directed by MD   LORATADINE (CLARITIN) 10 MG TABLET    Take 10 mg by mouth daily.   MULTIPLE VITAMINS-MINERALS (CERTAGEN PO)    Take 1 tablet by mouth daily.    NON FORMULARY    Take 90 mLs by mouth 2 (two) times daily. 2 cal supplement   POLYETHYL GLYCOL-PROPYL GLYCOL (SYSTANE) 0.4-0.3 % SOLN    Place 1 drop into both eyes 4 (four) times daily as needed (dry eyes).   POTASSIUM CHLORIDE SA (K-DUR,KLOR-CON) 20 MEQ TABLET    Take 20 mEq by mouth daily.   SENNA (SENOKOT) 8.6 MG TABS TABLET    Take 1 tablet (8.6 mg total) by mouth 2 (two) times daily.   SODIUM CHLORIDE (OCEAN) 0.65 % SOLN NASAL SPRAY    Place 1 spray into both nostrils 4 (four) times daily. And every 4 hours as needed   TRAVOPROST, BAK FREE, (TRAVATAN) 0.004 % SOLN OPHTHALMIC SOLUTION    Place 2 drops into both eyes at bedtime.    TRAZODONE (DESYREL) 50 MG TABLET    Take 0.5 tablets (25 mg total) by mouth at bedtime as needed for sleep.   VITAMIN B-12 (CYANOCOBALAMIN) 500 MCG TABLET    Take 500 mcg by mouth daily.   VITAMIN C (ASCORBIC ACID) 500 MG TABLET    Take 500 mg by mouth daily.  Modified Medications   No medications on file  Discontinued Medications   No medications on file     SIGNIFICANT DIAGNOSTIC EXAMS  07-16-14: right hand x-ray: no acute abnormalities   10-13-14: chest x-ray: no acute cardiopulmonary process present.   12-16-14: chest x-ray: moderate cardiomegaly; with mild pulmonary venous congestion  12-21-14: chest x-ray: no acute cardiopulmonary process   01-02-15: left hand x-ray: moderate osteoarthritis    LABS REVIEWED:   03-19-14: wbc 3.7; hgb 9.9; hct 33.2; mcv 108.5; plt 121; glucose 122; bun 25.7; creat 1.3; k+4.1; na++139; liver normal albumin 4.3 07-17-14: wbc 3.8; hgb 9.7; ;hct 31.;2 mcv 108; plt 113; glucose 154; bun 21.3; creat 1.43;  Liver normal albumin 4.0; tsh 6.13; hgb a1c 6.1; uric acid 7.1  08-18-14: tsh 4.09  10-13-14: wbc 4.7; hgb 9.7; hct 31.1; mcv  105.3; plt 111; glucose 176; bun 27.7; creat 1.44; k+4.4; na++143; C02: 35; liver normal albumin 4.5  12-21-14: wbc 5.1; hgb 9.7; hct 31.2; mcv 106.7; plt 129; glucose 217; bun 27.6; creat 1.37; k+ 3.8; na++135 01-06-15: hgb a1c 6.8 01-19-15: wbc 8.2; hgb 9.2; hct 31.4; mcv 90.2; plt 255; glucose 181; bun 24.4; creat 1.05; k+ 4.4; na++139     Review of Systems Constitutional: Negative for malaise/fatigue.  Respiratory: Negative for cough and shortness of breath.   Cardiovascular:  Negative for chest pain and leg swelling.  Gastrointestinal: Negative for heartburn, abdominal pain and constipation.  Musculoskeletal:  Negative for myalgias and joint pain.    Skin: Negative.   Neurological: Negative for headaches.  Psychiatric/Behavioral: Negative for depression. The patient is not nervous/anxious.       Physical Exam Constitutional: She appears well-developed and well-nourished. No distress.  Neck: Neck supple. No JVD present. No thyromegaly present.  Cardiovascular: Normal rate and intact distal pulses.   Heart rate irregular   Respiratory: Effort normal and breath sounds normal. No respiratory distress.  GI: Soft. Bowel sounds are normal. There is no tenderness.  Musculoskeletal: She exhibits no edema.  Is able to move all extremities   Neurological: She is alert.  Skin: Skin is warm and dry. She is not diaphoretic.     ASSESSMENT/ PLAN:  1. Chronic diastolic heart failure: is presently stable will continue daily weights; will continue lasix 40 mg twice daily with k+ 20 meq daily hydralazine 10 mg three times daily coreg 3.125 mg daily and will monitor her status.   2. Afib: her heart rate is stable will continue coreg 3.125 mg daily for rate control; wll continue plavix 75 mg daily and asa 81 mg daily will monitor  3. Hypertension: is stable will continue norvasc 2.5 mg daily; coreg 3.125 mg daily; hydralazine 10 mg three times daily;  Will monitor   4. Chronic respiratory  failure: is stable on chronic 02;  will monitor   5. Anemia: hgb is 9.2; will continue iron daily   6. Dyslipidemia: is presently not on medications; will monitor her status.    7. Allergic rhinitis: will continue claritin daily   8. Constipation: will continue senna twice daily   9. Depression: is stable; is followed by [facility] services; will continue lexapro 10 mg daily   10. Dementia: is without change is presently off medications; will not make changes    11.  Chronic pain due to osteoarthritis: her pain is presently managed:will continue   voltaren gel 2 gm to neck twice daily as needed; lidoderm to lower back; will continue tylenol 650 mg twice daily; and will continue baclofen 5 mg nightly for leg spasticity and will monitor   12. Glaucoma: will continue travoprost to both eyes nightly; trusopt to both eyes three times daily   13. Diabetes: is not on medications; her hgb a1c is 6.8 will not make changes will monitor      Ok Edwards NP [facility]  Contact [phone] Monday through Friday 8am- 5pm  After hours call [phone]

## 2015-02-26 NOTE — Progress Notes (Signed)
Patient ID: Maria Christian, female   DOB: 11-21-1924, 81 y.o.   MRN: [id number]    Facility: [facility]      Allergies  Allergen Reactions  . Aspirin Other (See Comments)    Maria Christian only    Chief Complaint  Patient presents with  . Acute Visit    patient status     HPI:  She has had n/v without diarrhea X3 in the past 1-2 days. Her appetite has declined along with a poor fluid intake. The nursing staff has checked her impaction which was negative. She is complaining of abdominal pain. She is also having a headache for the past several weeks which is achy in nature and has scalp tenderness present. There are no reports of fever present.     Past Medical History  Diagnosis Date  . Atrial fibrillation   . Altered mental status   . Encephalopathy   . Parkinson disease   . Hypertension   . GERD (gastroesophageal reflux disease)   . Hyperlipemia   . Diabetes mellitus   . Coronary artery disease   . History of adenomatous polyp of colon 06/24/99  . Enlarged heart   . DEMENTIA   . Edema of lower extremity 07/13/11    right leg more swollen than left leg  . Esophageal dysmotility 07/02/12  . Horseshoe kidney   . Pancreatic lesion 05/22/11    no further workup  per PCP/family due to age  . Anemia   . Peripheral neuropathy   . Depression   . Thrombophlebitis   . TIA (transient ischemic attack) 12/19/2012  . Sigmoid volvulus 05/24/2013    Past Surgical History  Procedure Laterality Date  . Shoulder surgery  2001    left clavicle excision and acromioplasty  . Abdominal hysterectomy    . Cholecystectomy    . Total hip arthroplasty    . Breast surgery      2 benign tumors removed left breast  . Foot surgery      benign tumors from foot  . Esophageal dilation      several times by Dr. [name]  . Nose surgery    . Esophagogastroduodenoscopy (egd) with esophageal dilation N/A 08/01/2012    Procedure: ESOPHAGOGASTRODUODENOSCOPY (EGD) WITH ESOPHAGEAL  DILATION;  Surgeon: [name], MD;  Location: [facility];  Service: Endoscopy;  Laterality: N/A;  with c-arm savory dilators  . Flexible sigmoidoscopy N/A 05/24/2013    Procedure: FLEXIBLE SIGMOIDOSCOPY;  Surgeon: [name], MD;  Location: [facility];  Service: Endoscopy;  Laterality: N/A;  . Flexible sigmoidoscopy N/A 05/26/2013    Procedure:  flex with decompression of sigmoid volvulus;  Surgeon: [name], MD;  Location: [facility];  Service: Endoscopy;  Laterality: N/A;    VITAL SIGNS BP 117/62 mmHg  Pulse 75  Ht 5\' 7"  (1.702 m)  Wt 161 lb (73.029 kg)  BMI 25.21 kg/m2  SpO2 96%  Patient's Medications  New Prescriptions   No medications on file  Previous Medications   ACETAMINOPHEN (TYLENOL) 325 MG TABLET    Take 2 tablets (650 mg total) by mouth every 6 (six) hours as needed for mild pain.   AMLODIPINE (NORVASC) 2.5 MG TABLET    Take 1 tablet (2.5 mg total) by mouth daily.   ASPIRIN 81 MG CHEWABLE TABLET    Chew 81 mg by mouth daily.   BACLOFEN (LIORESAL) 10 MG TABLET    Take 5 mg by mouth at bedtime.   CARVEDILOL (  COREG) 3.125 MG TABLET    Take 3.125 mg by mouth daily.   CHOLECALCIFEROL (VITAMIN D) 1000 UNITS TABLET    Take 1,000 Units by mouth daily.   CLOPIDOGREL (PLAVIX) 75 MG TABLET    Take 75 mg by mouth daily.    CRANBERRY 475 MG CAPS    Take 1 capsule by mouth 2 (two) times daily.   DICLOFENAC SODIUM (VOLTAREN) 1 % GEL    Apply 2 g topically 2 (two) times daily as needed (left neck pain).    DORZOLAMIDE (TRUSOPT) 2 % OPHTHALMIC SOLUTION    Place 1 drop into both eyes 3 (three) times daily.    ESCITALOPRAM (LEXAPRO) 10 MG TABLET    Take 1 tablet (10 mg total) by mouth daily.   FERROUS SULFATE 325 (65 FE) MG TABLET    Take 325 mg by mouth daily with breakfast.   FUROSEMIDE (LASIX) 40 MG TABLET    Take 1 tablet (40 mg total) by mouth 2 (two) times daily.   HYDRALAZINE (APRESOLINE) 10 MG TABLET    Take 10 mg by mouth 3 (three) times daily. HOLD if SBP  <100/60 and pulse is less than 60   LIDOCAINE (LIDODERM) 5 %    Place 1 patch onto the skin daily. Apply 1 patch to left lower back/hip every morning.  Remove & Discard patch within 12 hours or as directed by MD   LORATADINE (CLARITIN) 10 MG TABLET    Take 10 mg by mouth daily.   MULTIPLE VITAMINS-MINERALS (CERTAGEN PO)    Take 1 tablet by mouth daily.    NON FORMULARY    Take 90 mLs by mouth 2 (two) times daily. 2 cal supplement   POLYETHYL GLYCOL-PROPYL GLYCOL (SYSTANE) 0.4-0.3 % SOLN    Place 1 drop into both eyes 4 (four) times daily as needed (dry eyes).   POTASSIUM CHLORIDE SA (K-DUR,KLOR-CON) 20 MEQ TABLET    Take 20 mEq by mouth daily.   SENNA (SENOKOT) 8.6 MG TABS TABLET    Take 1 tablet (8.6 mg total) by mouth 2 (two) times daily.   SODIUM CHLORIDE (OCEAN) 0.65 % SOLN NASAL SPRAY    Place 1 spray into both nostrils 4 (four) times daily. And every 4 hours as needed   TRAVOPROST, BAK FREE, (TRAVATAN) 0.004 % SOLN OPHTHALMIC SOLUTION    Place 2 drops into both eyes at bedtime.    TRAZODONE (DESYREL) 50 MG TABLET    Take 0.5 tablets (25 mg total) by mouth at bedtime as needed for sleep.   VITAMIN B-12 (CYANOCOBALAMIN) 500 MCG TABLET    Take 500 mcg by mouth daily.   VITAMIN C (ASCORBIC ACID) 500 MG TABLET    Take 500 mg by mouth daily.  Modified Medications   No medications on file  Discontinued Medications   No medications on file     SIGNIFICANT DIAGNOSTIC EXAMS   07-16-14: right hand x-ray: no acute abnormalities   10-13-14: chest x-ray: no acute cardiopulmonary process present.   12-16-14: chest x-ray: moderate cardiomegaly; with mild pulmonary venous congestion  12-21-14: chest x-ray: no acute cardiopulmonary process   01-02-15: left hand x-ray: moderate osteoarthritis    LABS REVIEWED:   03-19-14: wbc 3.7; hgb 9.9; hct 33.2; mcv 108.5; plt 121; glucose 122; bun 25.7; creat 1.3; k+4.1; na++139; liver normal albumin 4.3 07-17-14: wbc 3.8; hgb 9.7; ;hct 31.;2 mcv 108; plt 113;  glucose 154; bun 21.3; creat 1.43;  Liver normal albumin 4.0; tsh 6.13; hgb a1c 6.1;  uric acid 7.1  08-18-14: tsh 4.09  10-13-14: wbc 4.7; hgb 9.7; hct 31.1; mcv 105.3; plt 111; glucose 176; bun 27.7; creat 1.44; k+4.4; na++143; C02: 35; liver normal albumin 4.5 12-21-14: wbc 5.1; hgb 9.7; hct 31.2; mcv 106.7; plt 129; glucose 217; bun 27.6; creat 1.37; k+ 3.8; na++135 01-06-15: hgb a1c 6.8 01-19-15: wbc 8.2; hgb 9.2; hct 31.4; mcv 90.2; plt 255; glucose 181; bun 24.4; creat 1.05; k+ 4.4; na++139        Review of Systems  Constitutional: Negative for appetite change and fatigue.  HENT: Negative for congestion.   Respiratory: Negative for cough, chest tightness and shortness of breath.   Cardiovascular: Negative for chest pain, palpitations and leg swelling.  Gastrointestinal: Positive for nausea, vomiting and abdominal pain. Negative for diarrhea and constipation.  Musculoskeletal: Negative for myalgias and arthralgias.  Skin: Negative for pallor.  Neurological: Positive for headaches. Negative for dizziness.  Psychiatric/Behavioral: The patient is not nervous/anxious.       Physical Exam  Constitutional: No distress.  HENT:  Head: Normocephalic and atraumatic.  Right Ear: External ear normal.  Left Ear: External ear normal.  Nose: Nose normal.  Has scalp tenderness No temporal tenderness   Eyes: Conjunctivae are normal.  Neck: Neck supple. No JVD present. No thyromegaly present.  Cardiovascular: Normal rate and intact distal pulses.   Heart rate irregular   Respiratory: Effort normal and breath sounds normal. No respiratory distress. She has no wheezes.  GI: Soft. Bowel sounds are normal. She exhibits no distension. There is tenderness.  Has tenderness diffusely with worse tenderness in her suprapubic area  Musculoskeletal: She exhibits no edema.  Able to move all extremities   Lymphadenopathy:    She has no cervical adenopathy.  Neurological: She is alert.  Skin: Skin is  warm and dry. She is not diaphoretic.  Psychiatric: She has a normal mood and affect.       ASSESSMENT/ PLAN:  1. UTI 2. Gastroenteritis 3. Headache  Her ua is pending will treat as indicated Will treat her GI symptoms with management Will get a sed rate to look at temporal arteritis; which is doubtful     Ok Edwards NP [facility]  Contact [phone] Monday through Friday 8am- 5pm  After hours call [phone]

## 2015-02-26 NOTE — ED Notes (Signed)
Bed: WA20 Expected date:  Expected time:  Means of arrival:  Comments: Ems-headache 81yo

## 2015-02-26 NOTE — Progress Notes (Signed)
*  PRELIMINARY RESULTS* Vascular Ultrasound Right lower extremity venous duplex has been completed.  Preliminary findings: negative for DVT.  Landry Mellow, RDMS, RVT  02/26/2015, 2:12 PM

## 2015-02-26 NOTE — ED Provider Notes (Signed)
CSN: [id number]     Arrival date & time 02/26/15  1214 History   First MD Initiated Contact with Patient 02/26/15 1239     Chief Complaint  Patient presents with  . headache x 2 weeks      (Consider location/radiation/quality/duration/timing/severity/associated sxs/prior Treatment) HPI Comments: 81 year old female with past medical history including dementia, HTN, Parkinson's disease, TIA, Parkinson's, GERD who presents with headaches. History obtained with the assistance of the patient's daughters. They state that the patient has had several months of frequent complaints of generalized headache. The patient states that she had a headache earlier today but it is currently resolved. No recent falls, head trauma, or loss of consciousness. They also report that she has had several weeks of complaints of abdominal pain as well as back pain. She has had an episode of vomiting almost every night around 1 AM and endorses nausea. She states that her last bowel movement was this morning. She is currently being treated for a UTI. She has a history of DVT but is not currently on anticoagulation. No fevers or cough/cold symptoms.  The history is provided by the patient and a relative.    Past Medical History  Diagnosis Date  . Atrial fibrillation   . Altered mental status   . Encephalopathy   . Parkinson disease   . Hypertension   . GERD (gastroesophageal reflux disease)   . Hyperlipemia   . Diabetes mellitus   . Coronary artery disease   . History of adenomatous polyp of colon 06/24/99  . Enlarged heart   . DEMENTIA   . Edema of lower extremity 07/13/11    right leg more swollen than left leg  . Esophageal dysmotility 07/02/12  . Horseshoe kidney   . Pancreatic lesion 05/22/11    no further workup  per PCP/family due to age  . Anemia   . Peripheral neuropathy   . Depression   . Thrombophlebitis   . TIA (transient ischemic attack) 12/19/2012  . Sigmoid volvulus 05/24/2013   Past Surgical  History  Procedure Laterality Date  . Shoulder surgery  2001    left clavicle excision and acromioplasty  . Abdominal hysterectomy    . Cholecystectomy    . Total hip arthroplasty    . Breast surgery      2 benign tumors removed left breast  . Foot surgery      benign tumors from foot  . Esophageal dilation      several times by Dr. [name]  . Nose surgery    . Esophagogastroduodenoscopy (egd) with esophageal dilation N/A 08/01/2012    Procedure: ESOPHAGOGASTRODUODENOSCOPY (EGD) WITH ESOPHAGEAL DILATION;  Surgeon: [name], MD;  Location: [facility];  Service: Endoscopy;  Laterality: N/A;  with c-arm savory dilators  . Flexible sigmoidoscopy N/A 05/24/2013    Procedure: FLEXIBLE SIGMOIDOSCOPY;  Surgeon: [name], MD;  Location: [facility];  Service: Endoscopy;  Laterality: N/A;  . Flexible sigmoidoscopy N/A 05/26/2013    Procedure:  flex with decompression of sigmoid volvulus;  Surgeon: [name], MD;  Location: [facility];  Service: Endoscopy;  Laterality: N/A;   Family History  Problem Relation Age of Onset  . Cancer    . Heart disease     Social History  Substance Use Topics  . Smoking status: Former Smoker    Quit date: 10/09/1964  . Smokeless tobacco: Never Used  . Alcohol Use: No   OB History    No data available  Review of Systems  10 Systems reviewed and are negative for acute change except as noted in the HPI.  Allergies  Aspirin  Home Medications   Prior to Admission medications   Medication Sig Start Date End Date Taking? Authorizing Provider  acetaminophen (TYLENOL) 325 MG tablet Take 2 tablets (650 mg total) by mouth every 6 (six) hours as needed for mild pain. Patient taking differently: Take 650 mg by mouth 2 (two) times daily.  11/03/14  Yes Gerlene Fee, NP  amLODipine (NORVASC) 2.5 MG tablet Take 1 tablet (2.5 mg total) by mouth daily. 01/06/14  Yes Barton Dubois, MD  aspirin 81 MG chewable tablet Chew 81 mg by mouth  daily.   Yes Historical Provider, MD  baclofen (LIORESAL) 10 MG tablet Take 5 mg by mouth at bedtime.   Yes Historical Provider, MD  bisacodyl (DULCOLAX) 10 MG suppository Place 10 mg rectally as needed for mild constipation or moderate constipation.   Yes Historical Provider, MD  carvedilol (COREG) 3.125 MG tablet Take 3.125 mg by mouth daily.   Yes Historical Provider, MD  cholecalciferol (VITAMIN D) 1000 UNITS tablet Take 1,000 Units by mouth daily.   Yes Historical Provider, MD  clopidogrel (PLAVIX) 75 MG tablet Take 75 mg by mouth daily.    Yes Historical Provider, MD  Cranberry 475 MG CAPS Take 1 capsule by mouth 2 (two) times daily.   Yes Historical Provider, MD  diclofenac sodium (VOLTAREN) 1 % GEL Apply 2 g topically 2 (two) times daily as needed (left neck pain).    Yes Historical Provider, MD  dorzolamide (TRUSOPT) 2 % ophthalmic solution Place 1 drop into both eyes 3 (three) times daily.    Yes Historical Provider, MD  escitalopram (LEXAPRO) 10 MG tablet Take 1 tablet (10 mg total) by mouth daily. 12/03/14  Yes Gerlene Fee, NP  ferrous sulfate 325 (65 FE) MG tablet Take 325 mg by mouth daily with breakfast.   Yes Historical Provider, MD  fluticasone (FLONASE) 50 MCG/ACT nasal spray Place 2 sprays into both nostrils at bedtime.   Yes Historical Provider, MD  furosemide (LASIX) 40 MG tablet Take 1 tablet (40 mg total) by mouth 2 (two) times daily. 01/05/14  Yes Barton Dubois, MD  hydrALAZINE (APRESOLINE) 10 MG tablet Take 10 mg by mouth 3 (three) times daily. HOLD if SBP <100/60 and pulse is less than 60   Yes Historical Provider, MD  lidocaine (LIDODERM) 5 % Place 1 patch onto the skin daily. Apply 1 patch to left lower back/hip every morning.  Remove & Discard patch within 12 hours or as directed by MD 07/28/13  Yes Thurnell Lose, MD  Linaclotide [facility]) 145 MCG CAPS capsule Take 145 mcg by mouth daily.   Yes Historical Provider, MD  loratadine (CLARITIN) 10 MG tablet Take 10 mg by  mouth daily.   Yes Historical Provider, MD  Multiple Vitamins-Minerals (CERTAGEN PO) Take 1 tablet by mouth daily.    Yes Historical Provider, MD  NON FORMULARY Take 90 mLs by mouth 2 (two) times daily. 2 cal supplement   Yes Historical Provider, MD  Polyethyl Glycol-Propyl Glycol (SYSTANE) 0.4-0.3 % SOLN Place 1 drop into both eyes 4 (four) times daily as needed (dry eyes).   Yes Historical Provider, MD  potassium chloride SA (K-DUR,KLOR-CON) 20 MEQ tablet Take 20 mEq by mouth daily. 10/23/13  Yes Janece Canterbury, MD  sodium chloride (OCEAN) 0.65 % SOLN nasal spray Place 1 spray into both nostrils 4 (four) times  daily. And every 4 hours as needed   Yes Historical Provider, MD  Travoprost, BAK Free, (TRAVATAN) 0.004 % SOLN ophthalmic solution Place 1 drop into both eyes at bedtime.    Yes Historical Provider, MD  traZODone (DESYREL) 50 MG tablet Take 0.5 tablets (25 mg total) by mouth at bedtime as needed for sleep. Patient taking differently: Take 25 mg by mouth at bedtime.  11/03/14  Yes Gerlene Fee, NP  vitamin B-12 (CYANOCOBALAMIN) 500 MCG tablet Take 500 mcg by mouth daily.   Yes Historical Provider, MD  vitamin C (ASCORBIC ACID) 500 MG tablet Take 500 mg by mouth daily.   Yes Historical Provider, MD  amoxicillin-clavulanate (AUGMENTIN) 875-125 MG per tablet Take 1 tablet by mouth 2 (two) times daily. 02/17/15   Historical Provider, MD  saccharomyces boulardii (FLORASTOR) 250 MG capsule Take 250 mg by mouth 2 (two) times daily. For 7 days to prevent gi symptoms    Historical Provider, MD  senna (SENOKOT) 8.6 MG TABS tablet Take 1 tablet (8.6 mg total) by mouth 2 (two) times daily. 01/05/14   Barton Dubois, MD   BP 128/51 mmHg  Pulse 65  Temp(Src) 99.1 F (37.3 C) (Oral)  Resp 14  SpO2 98% Physical Exam  Constitutional: She is oriented to person, place, and time.  Frail, elderly woman in NAD  HENT:  Head: Normocephalic and atraumatic.  Mouth/Throat: Oropharynx is clear and moist.   edentulous  Eyes: Conjunctivae are normal. Pupils are equal, round, and reactive to light.  Neck: Neck supple.  Cardiovascular: Normal rate, regular rhythm and normal heart sounds.   No murmur heard. Pulmonary/Chest: Effort normal and breath sounds normal.  Abdominal: Soft. Bowel sounds are normal.  Mildly distended abd w/ no tenderness  Musculoskeletal:  R lower leg edema w/ R leg larger than L; 2+ distal pulses; tenderness to palpation of lateral R lower leg  Neurological: She is alert and oriented to person, place, and time.  Fluent speech  Skin: Skin is warm and dry. No rash noted.  Psychiatric: She has a normal mood and affect.  Nursing note and vitals reviewed.   ED Course  Procedures (including critical care time) Labs Review Labs Reviewed  COMPREHENSIVE METABOLIC PANEL - Abnormal; Notable for the following:    Potassium 3.4 (*)    Glucose, Bld 160 (*)    BUN 23 (*)    Creatinine, Ser 1.28 (*)    ALT 10 (*)    GFR calc non Af Amer 36 (*)    GFR calc Af Amer 42 (*)    All other components within normal limits  CBC WITH DIFFERENTIAL/PLATELET - Abnormal; Notable for the following:    RBC 3.12 (*)    Hemoglobin 10.7 (*)    HCT 32.1 (*)    MCV 102.9 (*)    MCH 34.3 (*)    All other components within normal limits  URINALYSIS, ROUTINE W REFLEX MICROSCOPIC (NOT AT [facility]) - Abnormal; Notable for the following:    APPearance CLOUDY (*)    Leukocytes, UA LARGE (*)    All other components within normal limits  URINE MICROSCOPIC-ADD ON - Abnormal; Notable for the following:    Squamous Epithelial / LPF MANY (*)    Bacteria, UA FEW (*)    All other components within normal limits  URINE CULTURE    Imaging Review Ct Abdomen Pelvis Wo Contrast  02/26/2015   CLINICAL DATA:  Acute generalized abdominal pain for 2 weeks.  EXAM: CT ABDOMEN  AND PELVIS WITHOUT CONTRAST  TECHNIQUE: Multidetector CT imaging of the abdomen and pelvis was performed following the standard protocol  without IV contrast.  COMPARISON:  CT scan of December 27, 2013.  FINDINGS: Visualized lung bases appear normal. Status post left hip arthroplasty.  Status post cholecystectomy. No focal abnormality is noted in the liver, spleen or pancreas is noted on these unenhanced images. Atherosclerosis of abdominal aorta is noted without aneurysm formation. Horseshoe configuration of the kidneys is noted. Adrenal glands appear normal. No hydronephrosis or renal obstruction is noted. No renal or ureteral calculi are noted. Mildly dilated sigmoid colon is noted which appears to be due to ileus. No volvulus is noted. No abnormal fluid collection is noted. Urinary bladder appears normal. Status post hysterectomy. Ovaries appear normal. No significant adenopathy is noted.  IMPRESSION: Atherosclerosis of abdominal aorta is noted without aneurysm formation.  Horseshoe configuration of the kidneys is noted. No hydronephrosis or renal obstruction is noted. No renal or ureteral calculi are noted.  Dilated and air-filled sigmoid colon is noted most consistent with ileus. There is no evidence of volvulus or obstruction.   Electronically Signed   By: Marijo Conception, M.D.   On: 02/26/2015 14:54   Dg Abd 1 View  02/26/2015   CLINICAL DATA:  Initial encounter for 2 week history of abdominal pain and distention.  EXAM: ABDOMEN - 1 VIEW  COMPARISON:  12/27/2013.  FINDINGS: Supine view the abdomen shows diffuse gaseous distention of bowel, mainly representing colon. Imaging features are similar to prior study. Surgical clips in right upper quadrant compatible with prior cholecystectomy. Bones are diffusely demineralized.  IMPRESSION: Diffuse gaseous bowel distention, mainly involving colon. Imaging features may be related to chronic dysmotility, ileus, or evolving distal colonic obstruction.   Electronically Signed   By: Misty Stanley M.D.   On: 02/26/2015 13:44   Ct Head Wo Contrast  02/26/2015   CLINICAL DATA:  Headache x2 weeks, dementia   EXAM: CT HEAD WITHOUT CONTRAST  TECHNIQUE: Contiguous axial images were obtained from the base of the skull through the vertex without intravenous contrast.  COMPARISON:  MRI brain dated 01/12/2012  FINDINGS: No evidence of parenchymal hemorrhage or extra-axial fluid collection. No mass lesion, mass effect, or midline shift.  No CT evidence of acute infarction. Old right basal ganglia lacunar infarct (series 2/image 14).  Subcortical white matter and periventricular small vessel ischemic changes. Intracranial atherosclerosis.  Global cortical atrophy, likely age appropriate. No ventriculomegaly.  The visualized paranasal sinuses are essentially clear. The mastoid air cells are unopacified.  No evidence of calvarial fracture.  IMPRESSION: No evidence of acute intracranial abnormality.  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  Small vessel ischemic changes.   Electronically Signed   By: Julian Hy M.D.   On: 02/26/2015 13:40   I have personally reviewed and evaluated these lab results as part of my medical decision-making.   EKG Interpretation None      MDM   Final diagnoses:  Ileus  headaches, chronic   81 year old female with extensive past medical history presents with several months of frequent headaches as well as a few weeks of complaints of abdominal pain. She has had an episode of vomiting almost every night recently. Patient awake, alert, comfortable at presentation. Vital signs unremarkable. She had mild abdominal distention with no focal tenderness. Right leg was swollen more than left leg and tender to palpation. Obtained above lab work to evaluate abdominal pain and obtained head CT given chronic headaches. Also obtained  KUB because of patient's distention and lower extremity Doppler for her right leg swelling and history of DVT.  Labwork shows evidence of urinary tract infection; patient is currently on antibiotics and denies any complaints with dysuria. Creatinine stable at 1.28,  stable anemia with hemoglobin of 10.7. Head and neck CT were negative for acute process. KUB showed diffuse bowel distention. Obtained abdominal CT for better characterization. CT is consistent with ileus, no evidence of obstruction or volvulus. Because of the patient's abdominal distention and reports of daily vomiting, we will admit for supportive care to resolve ileus. Regarding headache, the patient is currently comfortable and no neurologic deficits to suggest acute intracranial process. I have discussed findings and admission plan with the patient and her family. They are in agreement. Patient admitted in stable condition.  Sharlett Iles, MD 02/26/15 1540

## 2015-02-26 NOTE — Clinical Social Work Note (Signed)
Clinical Social Work Assessment  Patient Details  Name: MONCHEL POLLITT MRN: [id number] Date of Birth: Oct 22, 1924  Date of referral:  02/26/15               Reason for consult:   (Patient is from [place] sought to share information with:   (None.) Permission granted to share information::  No  Name::        Agency::     Relationship::     Contact Information:     Housing/Transportation Living arrangements for the past 2 months:  [place] of Information:  Patient Patient Interpreter Needed:  None Criminal Activity/Legal Involvement Pertinent to Current Situation/Hospitalization:  No - Comment as needed Significant Relationships:  Adult Children (Patient states that her 2 daughters are her primary support. She states that both daughters visit her often.) Lives with:  Facility Resident Do you feel safe going back to the place where you live?  Yes Need for family participation in patient care:  Yes (Comment) (Patient states that daughters are active in participating with her care and beign a great support.)  Care giving concerns:  There are no care giving concerns at this time. The patient is a resident at [facility].   Social Worker assessment / plan:  CSW met with patient at bedside.  Patient was eating snack. There was no family present. Patient confirms that she presents to [facility] due to headache. Per note, the patient has a hx of dementia.  Patient states that she receives assistance with completing ADL's. Also, patient denies having incidents where she has fallen. Patient states that she has 2 daughters who are supportive. She states that one of her daughters visits her daily.   Patient informed CSW that she has surgery on her intestine last year.  Employment status:  Retired Forensic scientist:  Medicare PT Recommendations:  Not assessed at this time Information / Referral to community resources:   (None  needed at this time. The patient is a resident at [facility].)  Patient/Family's Response to care:  Patient's response to care is appropriate at this time. Patient is aware that she will be admitted, and is accepting.  Patient/Family's Understanding of and Emotional Response to Diagnosis, Current Treatment, and Prognosis:  Patient is understanding at this time. She states that she does not have any questions for CSW.  Emotional Assessment Appearance:  Appears stated age Attitude/Demeanor/Rapport:   (Well Mannered. Polite. Appropriate.) Affect (typically observed):  Accepting, Appropriate Orientation:  Oriented to Self, Oriented to Place, Oriented to  Time, Oriented to Situation (Patient was alert during interview.) Alcohol / Substance use:  Not Applicable Psych involvement (Current and /or in the community):  No (Comment)  Discharge Needs  Concerns to be addressed:  Adjustment to Illness Readmission within the last 30 days:  No Current discharge risk:  None Barriers to Discharge:  No Barriers Identified   Bernita Buffy, LCSW 02/26/2015, 10:34 PM

## 2015-02-26 NOTE — ED Notes (Signed)
Per [facility] pt from [place] , co headache x 2 weeks. Hx HTN. Also co weakness with no change to base line. Hx dementia yet A&O x 4. Pt is normally non ambulatory. Pt also reports abd pain and was treated for UTI recently , unknown if treatment was completed .

## 2015-02-27 ENCOUNTER — Inpatient Hospital Stay (HOSPITAL_COMMUNITY): Payer: Medicare Other

## 2015-02-27 DIAGNOSIS — N183 Chronic kidney disease, stage 3 unspecified: Secondary | ICD-10-CM | POA: Diagnosis present

## 2015-02-27 DIAGNOSIS — I5032 Chronic diastolic (congestive) heart failure: Secondary | ICD-10-CM

## 2015-02-27 DIAGNOSIS — K5909 Other constipation: Secondary | ICD-10-CM

## 2015-02-27 DIAGNOSIS — D638 Anemia in other chronic diseases classified elsewhere: Secondary | ICD-10-CM

## 2015-02-27 DIAGNOSIS — I482 Chronic atrial fibrillation, unspecified: Secondary | ICD-10-CM | POA: Diagnosis present

## 2015-02-27 DIAGNOSIS — R627 Adult failure to thrive: Secondary | ICD-10-CM

## 2015-02-27 DIAGNOSIS — F329 Major depressive disorder, single episode, unspecified: Secondary | ICD-10-CM

## 2015-02-27 DIAGNOSIS — L8990 Pressure ulcer of unspecified site, unspecified stage: Secondary | ICD-10-CM

## 2015-02-27 DIAGNOSIS — E876 Hypokalemia: Secondary | ICD-10-CM | POA: Diagnosis present

## 2015-02-27 DIAGNOSIS — I2510 Atherosclerotic heart disease of native coronary artery without angina pectoris: Secondary | ICD-10-CM

## 2015-02-27 DIAGNOSIS — K5900 Constipation, unspecified: Secondary | ICD-10-CM

## 2015-02-27 DIAGNOSIS — I10 Essential (primary) hypertension: Secondary | ICD-10-CM | POA: Diagnosis present

## 2015-02-27 DIAGNOSIS — R112 Nausea with vomiting, unspecified: Secondary | ICD-10-CM | POA: Diagnosis present

## 2015-02-27 DIAGNOSIS — D509 Iron deficiency anemia, unspecified: Secondary | ICD-10-CM | POA: Diagnosis present

## 2015-02-27 DIAGNOSIS — F0390 Unspecified dementia without behavioral disturbance: Secondary | ICD-10-CM

## 2015-02-27 HISTORY — DX: Chronic kidney disease, stage 3 unspecified: N18.30

## 2015-02-27 HISTORY — DX: Constipation, unspecified: K59.00

## 2015-02-27 LAB — CBC
HCT: 30.2 % — ABNORMAL LOW (ref 36.0–46.0)
Hemoglobin: 9.9 g/dL — ABNORMAL LOW (ref 12.0–15.0)
MCH: 33.9 pg (ref 26.0–34.0)
MCHC: 32.8 g/dL (ref 30.0–36.0)
MCV: 103.4 fL — ABNORMAL HIGH (ref 78.0–100.0)
PLATELETS: 167 10*3/uL (ref 150–400)
RBC: 2.92 MIL/uL — ABNORMAL LOW (ref 3.87–5.11)
RDW: 13.7 % (ref 11.5–15.5)
WBC: 4.8 10*3/uL (ref 4.0–10.5)

## 2015-02-27 LAB — COMPREHENSIVE METABOLIC PANEL
ALBUMIN: 3.4 g/dL — AB (ref 3.5–5.0)
ALK PHOS: 65 U/L (ref 38–126)
ALT: 9 U/L — ABNORMAL LOW (ref 14–54)
AST: 14 U/L — AB (ref 15–41)
Anion gap: 8 (ref 5–15)
BILIRUBIN TOTAL: 0.5 mg/dL (ref 0.3–1.2)
BUN: 20 mg/dL (ref 6–20)
CALCIUM: 8.9 mg/dL (ref 8.9–10.3)
CO2: 25 mmol/L (ref 22–32)
Chloride: 107 mmol/L (ref 101–111)
Creatinine, Ser: 1.32 mg/dL — ABNORMAL HIGH (ref 0.44–1.00)
GFR calc Af Amer: 40 mL/min — ABNORMAL LOW (ref >60)
GFR, EST NON AFRICAN AMERICAN: 35 mL/min — AB (ref >60)
GLUCOSE: 136 mg/dL — AB (ref 65–99)
POTASSIUM: 3.1 mmol/L — AB (ref 3.5–5.1)
Sodium: 140 mmol/L (ref 135–145)
TOTAL PROTEIN: 6.7 g/dL (ref 6.5–8.1)

## 2015-02-27 LAB — TSH: TSH: 5.235 u[IU]/mL — ABNORMAL HIGH (ref 0.350–4.500)

## 2015-02-27 LAB — PHOSPHORUS: Phosphorus: 3.6 mg/dL (ref 2.5–4.6)

## 2015-02-27 LAB — MAGNESIUM: MAGNESIUM: 2 mg/dL (ref 1.7–2.4)

## 2015-02-27 MED ORDER — CHLORHEXIDINE GLUCONATE 0.12 % MT SOLN
15.0000 mL | Freq: Two times a day (BID) | OROMUCOSAL | Status: DC
  Administered 2015-02-28: 15 mL via OROMUCOSAL
  Filled 2015-02-27 (×2): qty 15

## 2015-02-27 MED ORDER — POTASSIUM CHLORIDE 20 MEQ/15ML (10%) PO SOLN
20.0000 meq | Freq: Every day | ORAL | Status: DC
  Administered 2015-02-27 – 2015-02-28 (×2): 20 meq via ORAL
  Filled 2015-02-27 (×2): qty 15

## 2015-02-27 MED ORDER — SODIUM CHLORIDE 0.9 % IV SOLN: INTRAVENOUS | Status: DC

## 2015-02-27 MED ORDER — LINACLOTIDE 145 MCG PO CAPS
145.0000 ug | ORAL_CAPSULE | Freq: Every day | ORAL | Status: DC
  Administered 2015-02-27 – 2015-02-28 (×2): 145 ug via ORAL
  Filled 2015-02-27 (×2): qty 1

## 2015-02-27 MED ORDER — SODIUM CHLORIDE 0.9 % IV SOLN
INTRAVENOUS | Status: AC
Start: 2015-02-27 — End: 2015-02-28
  Administered 2015-02-28: 02:00:00 via INTRAVENOUS

## 2015-02-27 MED ORDER — CHLORHEXIDINE GLUCONATE 0.12 % MT SOLN: 15.0000 mL | Freq: Two times a day (BID) | OROMUCOSAL | Status: DC

## 2015-02-27 NOTE — Progress Notes (Addendum)
Patient ID: Maria Christian, female   DOB: 11/22/1924, 81 y.o.   MRN: [id number] [facility] PROGRESS NOTE  [name] EXH:[id number] DOB: 1925-05-05 DOA: 02/26/2015 PCP: [name], DO  Brief narrative:    81 y.o. female with past medical history of atrial fibrillation (on anticoagulation with aspirin and plavix, chronic diastolic CHF, parkinson's disease, related dementia, hypertension, iron deficiency anemia who presented from [facility] to [facility] ED with intermittent nausea and vomiting and poor oral intake for past few days prior to this admission. She also reported some headaches.  On admission, she was hemodynamically stable. CT head showed no acute intracranial findings. Abd x ray demonstrated possible ileus or evolving colonic obstruction. CT abdomen demonstrated ileus. She was admitted for further evaluation and management of ileus.  Anticipated discharge: repeat abd x ray today and advance diet if possible. Anticipate d/c 9/25.  Assessment/Plan:    Principal Problem:   Ileus / Nausea and vomiting - Pt presented with intermittent nausea and vomiting and was found to have ileus versus colonic obstruction as seen on abd x ray - CT abdomen confirmed ileus - She has been on clear since admission - Will repeat abd x ray this am - Advance diet if possible - Continue IV fluids for hydration until PO intake improves - Continue supportive care with antiemetics as needed for nausea or vomiting   Active Problems: Diastolic CHF, chronic - Compensated - Last 2 D ECHO in 07/2013 with preserved EF - Continue Norvasc and carvedilol - Continue aspirin and plavix  Coronary artery disease - Stable - No chest pain - Continue aspirin and plavix  Dementia without behavioral disturbance / Depression - Stable - Continue Lexapro  Malnutrition of moderate degree / FTT (failure to thrive) in adult - In the context of chronic illness - Nutrition consulted - PT eval  pending  Constipation - Continue linaclotide   Benign essential HTN - Continue Norvasc and carvedilol  Anemia, iron deficiency / Anemia of chronic disease - Likely iron deficiency and chronic anemia from chronic antiplatelet therapy with aspirin and plavix - Continue  Ferrous sulfate supplementation - Hgb stable  Chronic atrial fibrillation - CHADS vasc score at least 4 - Continue aspirin and plavix - Rate controlled with carvedilol  Hypokalemia - Likely from ileus and GI losses - Supplemented  CKD (chronic kidney disease) stage 3, GFR 30-59 ml/min - Baseline creatinine 1.4 in 01/2014 - Cr on this admission 1.28 so within baseline values   Pressure ulcer - Appreciate WOC assessment    DVT Prophylaxis  - Since pt on aspirin on plavix so will stop Lovenox and use SCD's bilaterally   Code Status: Full.  Family Communication:  Family not at the bedside this am Disposition Plan: needs physical therapy evaluation; anticipate D/C 9/25.  IV access:  Peripheral IV  Procedures and diagnostic studies:    Ct Abdomen Pelvis Wo Contrast 02/26/2015  Atherosclerosis of abdominal aorta is noted without aneurysm formation.  Horseshoe configuration of the kidneys is noted. No hydronephrosis or renal obstruction is noted. No renal or ureteral calculi are noted.  Dilated and air-filled sigmoid colon is noted most consistent with ileus. There is no evidence of volvulus or obstruction.     Dg Abd 1 View 02/26/2015   Diffuse gaseous bowel distention, mainly involving colon. Imaging features may be related to chronic dysmotility, ileus, or evolving distal colonic obstruction.     Ct Head Wo Contrast 02/26/2015  No evidence of acute intracranial abnormality.  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  Small vessel ischemic changes.      Medical Consultants:  None   Other Consultants:  Physical therapy Nutrition WOC  IAnti-Infectives:   None    Maria Lenz, MD  [facility] Pager  [phone]  Time spent in minutes: 25 minutes  If 7PM-7AM, please contact night-coverage [email or web address] Password [email or web address] 02/27/2015, 9:01 AM   LOS: 1 day    HPI/Subjective: No acute overnight events. No respiratory distress.  Objective: Filed Vitals:   02/26/15 2135 02/26/15 2200 02/26/15 2328 02/27/15 0630  BP: 130/53   132/56  Pulse: 74   70  Temp: 99.4 F (37.4 C)   99 F (37.2 C)  TempSrc: Oral   Oral  Resp: 16   16  Height:   5\' 7"  (1.702 m)   Weight:  73 kg (160 lb 15 oz) 73 kg (160 lb 15 oz)   SpO2: 99%   99%    Intake/Output Summary (Last 24 hours) at 02/27/15 0901 Last data filed at 02/27/15 0600  Gross per 24 hour  Intake    325 ml  Output    300 ml  Net     25 ml    Exam:   General:  Pt is sleeping, wakes up easily, not in acute distress  Cardiovascular: Rate controlled, S1/S2 (+)  Respiratory: Clear to auscultation bilaterally, no wheezing, no crackles, no rhonchi  Abdomen: Soft, non tender, non distended, bowel sounds present  Extremities: No swelling, palpable pulses   Neuro: Grossly nonfocal  Data Reviewed: Basic Metabolic Panel:  Recent Labs Lab 02/26/15 1257 02/27/15 0547  NA 141 140  K 3.4* 3.1*  CL 108 107  CO2 24 25  GLUCOSE 160* 136*  BUN 23* 20  CREATININE 1.28* 1.32*  CALCIUM 9.4 8.9  MG  --  2.0  PHOS  --  3.6   Liver Function Tests:  Recent Labs Lab 02/26/15 1257 02/27/15 0547  AST 17 14*  ALT 10* 9*  ALKPHOS 67 65  BILITOT 0.5 0.5  PROT 7.3 6.7  ALBUMIN 3.9 3.4*   No results for input(s): LIPASE, AMYLASE in the last 168 hours. No results for input(s): AMMONIA in the last 168 hours. CBC:  Recent Labs Lab 02/26/15 1257 02/27/15 0547  WBC 5.5 4.8  NEUTROABS 3.5  --   HGB 10.7* 9.9*  HCT 32.1* 30.2*  MCV 102.9* 103.4*  PLT 182 167   Cardiac Enzymes: No results for input(s): CKTOTAL, CKMB, CKMBINDEX, TROPONINI in the last 168 hours. BNP: Invalid input(s): POCBNP CBG: No results for input(s): GLUCAP  in the last 168 hours.  No results found for this or any previous visit (from the past 240 hour(s)).   Scheduled Meds: . amLODipine  2.5 mg Oral Daily  . aspirin  81 mg Oral Daily  . baclofen  5 mg Oral QHS  . carvedilol  3.125 mg Oral Daily  . [START ON 02/28/2015] chlorhexidine  15 mL Mouth/Throat BID  . clopidogrel  75 mg Oral Daily  . enoxaparin (LOVENOX) injection  30 mg Subcutaneous Q24H  . escitalopram  10 mg Oral Daily  . ferrous sulfate  325 mg Oral Q breakfast  . fluticasone  2 spray Each Nare QHS  . latanoprost  1 drop Both Eyes QHS  . lidocaine  1 patch Transdermal Q24H  . metoCLOPramide (REGLAN) injection  5 mg Intravenous 4 times per day  . potassium chloride  20 mEq Oral Daily  . traZODone  25 mg Oral  QHS

## 2015-02-28 DIAGNOSIS — R112 Nausea with vomiting, unspecified: Secondary | ICD-10-CM

## 2015-02-28 LAB — URINE CULTURE: Special Requests: NORMAL

## 2015-02-28 MED ORDER — FOSFOMYCIN TROMETHAMINE 3 G PO PACK
3.0000 g | PACK | Freq: Once | ORAL | Status: AC
  Administered 2015-02-28: 3 g via ORAL
  Filled 2015-02-28: qty 3

## 2015-02-28 MED ORDER — FUROSEMIDE 40 MG PO TABS: 40.0000 mg | ORAL_TABLET | Freq: Every day | ORAL | Status: DC

## 2015-02-28 MED ORDER — ONDANSETRON HCL 4 MG PO TABS: 4.0000 mg | ORAL_TABLET | Freq: Four times a day (QID) | ORAL | Status: DC | PRN

## 2015-02-28 NOTE — Progress Notes (Addendum)
Patient being discharged back to facility per transport services, IV removed, d/c paperwork with driver, pt's daughter at bedside now and aware  Pt's daughter dressed pt in pajamas for transport and  requested a brief but was told that briefs are not available on unit, disposable incontinent pads given

## 2015-02-28 NOTE — Clinical Social Work Note (Signed)
CSW prepared discharge packet and provided it to RN  CSW faxed summary to [facility] .  CSW arranged for pt transport [facility]  .Dede Query, LCSW [facility] Clinical Social Worker - Weekend Coverage cell #: [phone]

## 2015-02-28 NOTE — Discharge Summary (Addendum)
Physician Discharge Summary  KAIDYNCE PFISTER YBO:[id number] DOB: April 07, 1925 DOA: 02/26/2015  PCP: [name], DO  Admit date: 02/26/2015 Discharge date: 02/28/2015  Recommendations for Outpatient Follow-up:  Please note we changed Lasix to 40 mg once a day instead of twice a day since BP 139/47 and pt already on carvedilol and Norvasc. Please note urine culture grew E.Coli - pt will get one dose of fosfomycin prior to discharge as per pharmacy recommendations.   Discharge Diagnoses:  Principal Problem:   Ileus Active Problems:   Nausea and vomiting   Diastolic CHF, chronic   Coronary artery disease   Dementia   Depression   FTT (failure to thrive) in adult   Malnutrition of moderate degree   Constipation   Benign essential HTN   Anemia, iron deficiency   Anemia of chronic disease   Chronic atrial fibrillation   Hypokalemia   CKD (chronic kidney disease) stage 3, GFR 30-59 ml/min    Discharge Condition: stable   Diet recommendation: as tolerated, soft diet    History of present illness:  81 y.o. female with past medical history of atrial fibrillation (on anticoagulation with aspirin and plavix, chronic diastolic CHF, parkinson's disease, related dementia, hypertension, iron deficiency anemia who presented from [facility] to [facility] ED with intermittent nausea and vomiting and poor oral intake for past few days prior to this admission. She also reported some headaches.  On admission, she was hemodynamically stable. CT head showed no acute intracranial findings. Abd x ray demonstrated possible ileus or evolving colonic obstruction. CT abdomen demonstrated ileus. She was admitted for further evaluation and management of ileus.  Hospital Course:  Assessment/Plan:    Principal Problem:  Ileus / Nausea and vomiting - Nausea and vomiting likely from ileus versus colonic obstruction as seen on admission abd x ray - Repeat abd x ray showed improvement - She tolerated  soft diet   Active Problems: Diastolic CHF, chronic - Compensated - 2 D ECHO in 07/2013 with preserved EF - Continue Norvasc and carvedilol on discharge  - Continue aspirin and plavix on discharge   Coronary artery disease - Stable - Continue aspirin and plavix  Dementia without behavioral disturbance / Depression - Stable - Continue Lexapro  Malnutrition of moderate degree / FTT (failure to thrive) in adult - In the context of chronic illness - Dietician consulted   Constipation - Continue linaclotide   Benign essential HTN - Continue carvedilol and Norvasc   Anemia, iron deficiency / Anemia of chronic disease - Likely combination of iron deficiency and chronic anemia from chronic antiplatelet therapy with aspirin and plavix - Continue iron supplementation   Chronic atrial fibrillation - CHADS vasc score at least 4 - Continue aspirin and plavix - Continue carvedilol  Hypokalemia - Likely from ileus, lasix - Will continue daily supplementation on discharge   CKD (chronic kidney disease) stage 3, GFR 30-59 ml/min - Baseline creatinine 1.4 in 01/2014 - Cr on this admission is within baseline values   Pressure ulcer, buttock area  - WOC assessment requested - Pt has deep tissue injury    DVT Prophylaxis  - On aspirin on plavix    Code Status: Full.  Family Communication: Family not at the bedside    IV access:  Peripheral IV  Procedures and diagnostic studies:   Ct Abdomen Pelvis Wo Contrast 02/26/2015 Atherosclerosis of abdominal aorta is noted without aneurysm formation. Horseshoe configuration of the kidneys is noted. No hydronephrosis or renal obstruction is noted. No renal  or ureteral calculi are noted. Dilated and air-filled sigmoid colon is noted most consistent with ileus. There is no evidence of volvulus or obstruction.   Dg Abd 1 View 02/26/2015 Diffuse gaseous bowel distention, mainly involving colon. Imaging features may be  related to chronic dysmotility, ileus, or evolving distal colonic obstruction.   Ct Head Wo Contrast 02/26/2015 No evidence of acute intracranial abnormality. 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 Small vessel ischemic changes.    Medical Consultants:  None   Other Consultants:  Physical therapy Nutrition WOC  IAnti-Infectives:   None      Signed:  Leisa Lenz, MD  [facility] 02/28/2015, 10:43 AM  Pager #: [phone]  Time spent in minutes: more than 30 minutes  Discharge Exam: Filed Vitals:   02/28/15 0615  BP: 139/47  Pulse: 79  Temp: 99.8 F (37.7 C)  Resp: 18   Filed Vitals:   02/27/15 0630 02/27/15 1400 02/27/15 2148 02/28/15 0615  BP: 132/56 108/43 114/47 139/47  Pulse: 70 62 50 79  Temp: 99 F (37.2 C) 98.2 F (36.8 C) 99.2 F (37.3 C) 99.8 F (37.7 C)  TempSrc: Oral Oral Oral Oral  Resp: 16 16 16 18   Height:      Weight:      SpO2: 99% 99% 97% 95%    General: Pt is  not in acute distress Cardiovascular: Regular rate and rhythm, S1/S2 + Respiratory: Clear to auscultation bilaterally, no wheezing, no crackles, no rhonchi Abdominal: Soft, non tender, non distended, bowel sounds +, no guarding Extremities: no cyanosis, pulses palpable bilaterally DP and PT Neuro: Grossly nonfocal  Discharge Instructions  Discharge Instructions    Call MD for:  difficulty breathing, headache or visual disturbances    Complete by:  As directed      Call MD for:  persistant nausea and vomiting    Complete by:  As directed      Call MD for:  severe uncontrolled pain    Complete by:  As directed      Diet - low sodium heart healthy    Complete by:  As directed      Discharge instructions    Complete by:  As directed   Please note we changed Lasix to 40 mg once a day instead of twice a day since BP 139/47 and pt already on carvedilol, Norvasc.     Increase activity slowly    Complete by:  As directed             Medication List     STOP taking these medications        amoxicillin-clavulanate 875-125 MG per tablet  Commonly known as:  AUGMENTIN     hydrALAZINE 10 MG tablet  Commonly known as:  APRESOLINE     saccharomyces boulardii 250 MG capsule  Commonly known as:  FLORASTOR      TAKE these medications        acetaminophen 325 MG tablet  Commonly known as:  TYLENOL  Take 2 tablets (650 mg total) by mouth every 6 (six) hours as needed for mild pain.     amLODipine 2.5 MG tablet  Commonly known as:  NORVASC  Take 1 tablet (2.5 mg total) by mouth daily.     aspirin 81 MG chewable tablet  Chew 81 mg by mouth daily.     baclofen 10 MG tablet  Commonly known as:  LIORESAL  Take 5 mg by mouth at bedtime.     bisacodyl 10 MG  suppository  Commonly known as:  DULCOLAX  Place 10 mg rectally as needed for mild constipation or moderate constipation.     carvedilol 3.125 MG tablet  Commonly known as:  COREG  Take 3.125 mg by mouth daily.     CERTAGEN PO  Take 1 tablet by mouth daily.     cholecalciferol 1000 UNITS tablet  Commonly known as:  VITAMIN D  Take 1,000 Units by mouth daily.     clopidogrel 75 MG tablet  Commonly known as:  PLAVIX  Take 75 mg by mouth daily.     Cranberry 475 MG Caps  Take 1 capsule by mouth 2 (two) times daily.     dorzolamide 2 % ophthalmic solution  Commonly known as:  TRUSOPT  Place 1 drop into both eyes 3 (three) times daily.     escitalopram 10 MG tablet  Commonly known as:  LEXAPRO  Take 1 tablet (10 mg total) by mouth daily.     ferrous sulfate 325 (65 FE) MG tablet  Take 325 mg by mouth daily with breakfast.     fluticasone 50 MCG/ACT nasal spray  Commonly known as:  FLONASE  Place 2 sprays into both nostrils at bedtime.     furosemide 40 MG tablet  Commonly known as:  LASIX  Take 1 tablet (40 mg total) by mouth daily.     lidocaine 5 %  Commonly known as:  LIDODERM  Place 1 patch onto the skin daily. Apply 1 patch to left lower back/hip every  morning.  Remove & Discard patch within 12 hours or as directed by MD     LINZESS 145 MCG Caps capsule  Generic drug:  Linaclotide  Take 145 mcg by mouth daily.     loratadine 10 MG tablet  Commonly known as:  CLARITIN  Take 10 mg by mouth daily.     NON FORMULARY  Take 90 mLs by mouth 2 (two) times daily. 2 cal supplement     ondansetron 4 MG tablet  Commonly known as:  ZOFRAN  Take 1 tablet (4 mg total) by mouth every 6 (six) hours as needed for nausea.     potassium chloride SA 20 MEQ tablet  Commonly known as:  K-DUR,KLOR-CON  Take 20 mEq by mouth daily.     senna 8.6 MG Tabs tablet  Commonly known as:  SENOKOT  Take 1 tablet (8.6 mg total) by mouth 2 (two) times daily.     sodium chloride 0.65 % Soln nasal spray  Commonly known as:  OCEAN  Place 1 spray into both nostrils 4 (four) times daily. And every 4 hours as needed     SYSTANE 0.4-0.3 % Soln  Generic drug:  Polyethyl Glycol-Propyl Glycol  Place 1 drop into both eyes 4 (four) times daily as needed (dry eyes).     Travoprost (BAK Free) 0.004 % Soln ophthalmic solution  Commonly known as:  TRAVATAN  Place 1 drop into both eyes at bedtime.     traZODone 50 MG tablet  Commonly known as:  DESYREL  Take 0.5 tablets (25 mg total) by mouth at bedtime as needed for sleep.     vitamin B-12 500 MCG tablet  Commonly known as:  CYANOCOBALAMIN  Take 500 mcg by mouth daily.     vitamin C 500 MG tablet  Commonly known as:  ASCORBIC ACID  Take 500 mg by mouth daily.     VOLTAREN 1 % Gel  Generic drug:  diclofenac sodium  Apply 2 g topically 2 (two) times daily as needed (left neck pain).            Follow-up Information    Follow up with [name], DO. Schedule an appointment as soon as possible for a visit in 1 week.   Specialty:  Internal Medicine   Why:  Follow up appt after recent hospitalization   Contact information:   [place] [address] [phone]        The results of  significant diagnostics from this hospitalization (including imaging, microbiology, ancillary and laboratory) are listed below for reference.    Significant Diagnostic Studies: Ct Abdomen Pelvis Wo Contrast  02/26/2015   CLINICAL DATA:  Acute generalized abdominal pain for 2 weeks.  EXAM: CT ABDOMEN AND PELVIS WITHOUT CONTRAST  TECHNIQUE: Multidetector CT imaging of the abdomen and pelvis was performed following the standard protocol without IV contrast.  COMPARISON:  CT scan of December 27, 2013.  FINDINGS: Visualized lung bases appear normal. Status post left hip arthroplasty.  Status post cholecystectomy. No focal abnormality is noted in the liver, spleen or pancreas is noted on these unenhanced images. Atherosclerosis of abdominal aorta is noted without aneurysm formation. Horseshoe configuration of the kidneys is noted. Adrenal glands appear normal. No hydronephrosis or renal obstruction is noted. No renal or ureteral calculi are noted. Mildly dilated sigmoid colon is noted which appears to be due to ileus. No volvulus is noted. No abnormal fluid collection is noted. Urinary bladder appears normal. Status post hysterectomy. Ovaries appear normal. No significant adenopathy is noted.  IMPRESSION: Atherosclerosis of abdominal aorta is noted without aneurysm formation.  Horseshoe configuration of the kidneys is noted. No hydronephrosis or renal obstruction is noted. No renal or ureteral calculi are noted.  Dilated and air-filled sigmoid colon is noted most consistent with ileus. There is no evidence of volvulus or obstruction.   Electronically Signed   By: Marijo Conception, M.D.   On: 02/26/2015 14:54   Dg Ribs Unilateral Left  02/27/2015   CLINICAL DATA:  Left lateral rib pain for 2 days.  EXAM: LEFT RIBS - 2 VIEW  COMPARISON:  01/05/2014  FINDINGS: No visible rib fractures. No pneumothorax or effusion. Visualized left lung clear. Cardiomegaly.  IMPRESSION: No visible rib fracture.   Electronically Signed   By:  Rolm Baptise M.D.   On: 02/27/2015 09:36   Dg Abd 1 View  02/26/2015   CLINICAL DATA:  Initial encounter for 2 week history of abdominal pain and distention.  EXAM: ABDOMEN - 1 VIEW  COMPARISON:  12/27/2013.  FINDINGS: Supine view the abdomen shows diffuse gaseous distention of bowel, mainly representing colon. Imaging features are similar to prior study. Surgical clips in right upper quadrant compatible with prior cholecystectomy. Bones are diffusely demineralized.  IMPRESSION: Diffuse gaseous bowel distention, mainly involving colon. Imaging features may be related to chronic dysmotility, ileus, or evolving distal colonic obstruction.   Electronically Signed   By: Misty Stanley M.D.   On: 02/26/2015 13:44   Ct Head Wo Contrast  02/26/2015   CLINICAL DATA:  Headache x2 weeks, dementia  EXAM: CT HEAD WITHOUT CONTRAST  TECHNIQUE: Contiguous axial images were obtained from the base of the skull through the vertex without intravenous contrast.  COMPARISON:  MRI brain dated 01/12/2012  FINDINGS: No evidence of parenchymal hemorrhage or extra-axial fluid collection. No mass lesion, mass effect, or midline shift.  No CT evidence of acute infarction. Old right basal ganglia lacunar infarct (  series 2/image 14).  Subcortical white matter and periventricular small vessel ischemic changes. Intracranial atherosclerosis.  Global cortical atrophy, likely age appropriate. No ventriculomegaly.  The visualized paranasal sinuses are essentially clear. The mastoid air cells are unopacified.  No evidence of calvarial fracture.  IMPRESSION: No evidence of acute intracranial abnormality.  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  Small vessel ischemic changes.   Electronically Signed   By: Julian Hy M.D.   On: 02/26/2015 13:40   Dg Abd Portable 1v  02/27/2015   CLINICAL DATA:  Abdominal distension.  Follow up ileus.  EXAM: PORTABLE ABDOMEN - 1 VIEW  COMPARISON:  CT abdomen and pelvis and abdominal radiograph 02/26/2015   FINDINGS: Moderate gaseous distension of the sigmoid colon is stable to slightly improved compared to yesterday's radiograph. There is a moderate amount of stool in the more proximal colon. No definite small bowel dilatation is seen. Right upper quadrant surgical clips are noted. Left hip arthroplasty is partially visualized. Bones are osteopenic.  IMPRESSION: Stable to slightly improved gaseous distension of the sigmoid colon which may reflect ileus.   Electronically Signed   By: Logan Bores M.D.   On: 02/27/2015 10:21    Microbiology: Recent Results (from the past 240 hour(s))  Urine culture     Status: None   Collection Time: 02/26/15  2:49 PM  Result Value Ref Range Status   Specimen Description URINE, CATHETERIZED  Final   Special Requests Normal  Final   Culture   Final    20,000 COLONIES/mL ESCHERICHIA COLI Confirmed Extended Spectrum Beta-Lactamase Producer (ESBL) Performed at [facility]    Report Status 02/28/2015 FINAL  Final   Organism ID, Bacteria ESCHERICHIA COLI  Final      Susceptibility   Escherichia coli - MIC*    AMPICILLIN >=32 RESISTANT Resistant     CEFAZOLIN >=64 RESISTANT Resistant     CEFTRIAXONE >=64 RESISTANT Resistant     CIPROFLOXACIN <=0.25 SENSITIVE Sensitive     GENTAMICIN <=1 SENSITIVE Sensitive     IMIPENEM <=0.25 SENSITIVE Sensitive     NITROFURANTOIN <=16 SENSITIVE Sensitive     TRIMETH/SULFA >=320 RESISTANT Resistant     AMPICILLIN/SULBACTAM >=32 RESISTANT Resistant     PIP/TAZO 64 INTERMEDIATE Intermediate     * 20,000 COLONIES/mL ESCHERICHIA COLI     Labs: Basic Metabolic Panel:  Recent Labs Lab 02/26/15 1257 02/27/15 0547  NA 141 140  K 3.4* 3.1*  CL 108 107  CO2 24 25  GLUCOSE 160* 136*  BUN 23* 20  CREATININE 1.28* 1.32*  CALCIUM 9.4 8.9  MG  --  2.0  PHOS  --  3.6   Liver Function Tests:  Recent Labs Lab 02/26/15 1257 02/27/15 0547  AST 17 14*  ALT 10* 9*  ALKPHOS 67 65  BILITOT 0.5 0.5  PROT 7.3 6.7   ALBUMIN 3.9 3.4*   No results for input(s): LIPASE, AMYLASE in the last 168 hours. No results for input(s): AMMONIA in the last 168 hours. CBC:  Recent Labs Lab 02/26/15 1257 02/27/15 0547  WBC 5.5 4.8  NEUTROABS 3.5  --   HGB 10.7* 9.9*  HCT 32.1* 30.2*  MCV 102.9* 103.4*  PLT 182 167   Cardiac Enzymes: No results for input(s): CKTOTAL, CKMB, CKMBINDEX, TROPONINI in the last 168 hours. BNP: BNP (last 3 results) No results for input(s): BNP in the last 8760 hours.  ProBNP (last 3 results) No results for input(s): PROBNP in the last 8760 hours.  CBG: No results  for input(s): GLUCAP in the last 168 hours.

## 2015-02-28 NOTE — Discharge Instructions (Signed)
Ileus The intestine (bowel, or gut) is a long, muscular tube connecting your stomach to your rectum. If the intestine stops working, food cannot pass through. This is called an ileus. This can happen for a variety of reasons. Ileus is a major medical problem that usually requires hospitalization. If your intestine stops working because of a blockage, this is called a bowel obstruction and is a different condition. CAUSES   Surgery in your abdomen. This can last from a few hours to a few days.  An infection or inflammation in the belly (abdomen). This includes inflammation of the lining of the abdomen (peritonitis).  Infection or inflammation in other parts of the body, such as pneumonia or pancreatitis.  Passage of gallstones or kidney stones.  Damage to the nerves or blood vessels which go to the bowel.  Imbalance in the salts in the blood (electrolytes).  Injury to the brain and/or spinal cord.  Medications. Many medications can cause ileus or make it worse. The most common of these are strong pain medications. SYMPTOMS  Symptoms of bowel obstruction come from the bowel inactivity. They may include:  Bloating. Your belly gets bigger (distension).  Pain or discomfort in the abdomen.  Poor appetite, feeling sick to your stomach (nausea), and vomiting.  You may also not be able to hear your normal bowel sounds, such as "growling" in your stomach. DIAGNOSIS   Your history and a physical exam will usually suggest to your caregiver that you have an ileus.  X-rays or a CT scan of your abdomen will confirm the diagnosis. X-rays, CT scans, and lab tests may also suggest the cause. TREATMENT   Rest the intestine until it starts working again. This is most often accomplished by:  Stopping intake of oral food and drink. Dehydration is prevented by using IV (intravenous) fluids.  Sometimes, a nasogastric tube (NG tube) is needed. This is a narrow plastic tube inserted through your nose  and into your stomach. It is connected to suction to keep the stomach emptied out. This also helps treat the nausea and vomiting.  If there is an imbalance in the electrolytes, they are corrected with supplements in your intravenous fluids.  Medications that might make an ileus worse might be stopped.  There are no medications that reliably treat ileus, though your caregiver may suggest a trial of certain medications.  If your condition is slow to resolve, you will be reevaluated to be sure another condition, such as a blockage, is not present. Ileus is common and usually has a good outcome. Depending on the cause of your ileus, it usually can be treated by your caregivers with good results. Sometimes, specialists (surgeons or gastroenterologists) are asked to assist in your care.  HOME CARE INSTRUCTIONS   Follow your caregiver's instructions regarding diet and fluid intake. This will usually include drinking plenty of clear fluids, avoiding alcohol and caffeine, and eating a gentle diet.  Follow your caregiver's instructions regarding activity. A period of rest is sometimes advised before returning to work or school.  Take only medications prescribed by your caregiver. Be especially careful with narcotic pain medication, which can slow your bowel activity and contribute to ileus.  Keep any follow-up appointments with your caregiver or specialists. SEEK MEDICAL CARE IF:   You have a recurrence of nausea, vomiting, or abdominal discomfort.  You develop fever of more than 102 F (38.9 C). SEEK IMMEDIATE MEDICAL CARE IF:   You have severe abdominal pain.  You are unable to keep  fluids down. Document Released: 05/25/2003 Document Revised: 10/06/2013 Document Reviewed: 09/24/2008 [facility] Patient Information 2015 [place], [place]. This information is not intended to replace advice given to you by your health care provider. Make sure you discuss any questions you have with your health care  provider.

## 2015-02-28 NOTE — Evaluation (Signed)
Physical Therapy Evaluation Patient Details Name: Maria Christian MRN: [id number] DOB: Oct 27, 1924 Today's Date: 02/28/2015   History of Present Illness  81 y.o. female with past medical history of atrial fibrillation, chronic diastolic CHF, parkinson's disease, dementia, hypertension, iron deficiency anemia who presented from [facility] to [facility] ED with intermittent nausea and vomiting and poor oral intake for past few days prior to this admission, CT confirmed ileus. She also reported some headaches.  Clinical Impression  Pt will benefit from PT at SNF, recommend re-eval when pt returns to [facility]; She would like to be able to transfer to her w/c and go to [facility]; pt for D/C today or tomorrow so will sign off; please re-order if needed    Follow Up Recommendations SNF    Equipment Recommendations       Recommendations for Other Services       Precautions / Restrictions Precautions Precautions: Fall      Mobility  Bed Mobility Overal bed mobility: Needs Assistance Bed Mobility: Rolling Rolling: Mod assist         General bed mobility comments: pt willing to roll but declines sitting EOB  Transfers                    Ambulation/Gait                Stairs            Wheelchair Mobility    Modified Rankin (Stroke Patients Only)       Balance                                             Pertinent Vitals/Pain Pain Assessment: 0-10 Pain Score: 3  Pain Location: head Pain Descriptors / Indicators: Aching Pain Intervention(s): Limited activity within patient's tolerance;Monitored during session    Home Living Family/patient expects to be discharged to:: Skilled nursing facility                 Additional Comments: resides at [facility] for >2 years    Prior Function Level of Independence: Needs assistance   Gait / Transfers Assistance Needed: transferred with 2 person assist  to w/c and/or BSC up until  (?a few weeks ago "not long" per pt); pt is non-ambulatory; she self propels w/c with bil UEs  ADL's / Homemaking Assistance Needed: total assist LB; pt set up for UB bathing        Hand Dominance        Extremity/Trunk Assessment   Upper Extremity Assessment: Generalized weakness           Lower Extremity Assessment: Generalized weakness         Communication   Communication: No difficulties;HOH  Cognition Arousal/Alertness: Awake/alert Behavior During Therapy: WFL for tasks assessed/performed Overall Cognitive Status: Within Functional Limits for tasks assessed                      General Comments      Exercises        Assessment/Plan    PT Assessment All further PT needs can be met in the next venue of care  PT Diagnosis Generalized weakness   PT Problem List    PT Treatment Interventions     PT Goals (Current goals can be found in the Care Plan section) Acute Rehab  PT Goals Patient Stated Goal:  be able to go to   [facility] PT Goal Formulation: With patient    Frequency     Barriers to discharge        Co-evaluation               End of Session   Activity Tolerance: Patient limited by fatigue Patient left: in bed;with call bell/phone within reach Nurse Communication: Mobility status         Time: 1416-1436 PT Time Calculation (min) (ACUTE ONLY): 20 min   Charges:   PT Evaluation $Initial PT Evaluation Tier I: 1 Procedure     PT G CodesKenyon Ana 2015/03/12, 4:11 PM

## 2015-03-01 ENCOUNTER — Encounter: Payer: Self-pay | Admitting: Internal Medicine

## 2015-03-01 ENCOUNTER — Ambulatory Visit: Payer: Medicare Other | Admitting: Nurse Practitioner

## 2015-03-01 ENCOUNTER — Non-Acute Institutional Stay (SKILLED_NURSING_FACILITY): Payer: Medicare Other | Admitting: Internal Medicine

## 2015-03-01 DIAGNOSIS — F0390 Unspecified dementia without behavioral disturbance: Secondary | ICD-10-CM | POA: Diagnosis not present

## 2015-03-01 DIAGNOSIS — K5909 Other constipation: Secondary | ICD-10-CM

## 2015-03-01 DIAGNOSIS — R519 Headache, unspecified: Secondary | ICD-10-CM

## 2015-03-01 DIAGNOSIS — R51 Headache: Secondary | ICD-10-CM | POA: Diagnosis not present

## 2015-03-01 DIAGNOSIS — I482 Chronic atrial fibrillation, unspecified: Secondary | ICD-10-CM

## 2015-03-01 DIAGNOSIS — I5032 Chronic diastolic (congestive) heart failure: Secondary | ICD-10-CM | POA: Diagnosis not present

## 2015-03-01 DIAGNOSIS — F329 Major depressive disorder, single episode, unspecified: Secondary | ICD-10-CM

## 2015-03-01 DIAGNOSIS — I10 Essential (primary) hypertension: Secondary | ICD-10-CM | POA: Diagnosis not present

## 2015-03-01 DIAGNOSIS — R140 Abdominal distension (gaseous): Secondary | ICD-10-CM | POA: Insufficient documentation

## 2015-03-01 DIAGNOSIS — F32A Depression, unspecified: Secondary | ICD-10-CM

## 2015-03-01 NOTE — Progress Notes (Signed)
Patient ID: Maria Christian, female   DOB: 1924-08-01, 81 y.o.   MRN: [id number]    HISTORY AND PHYSICAL   DATE: 03/01/15  Location:  [facility]    Place of Service: SNF [phone])   Extended Emergency Contact Information Primary Emergency Contact: [place] Phone: [phone] Relation: Son Secondary Emergency Contact: Roberts,Lynn Address: [address]          [place], [place] [address] [name] of [place] Phone: [phone] Mobile Phone: [phone] Relation: Daughter  Advanced Directive information  DNR  Chief Complaint  Patient presents with  . Readmit To SNF    HPI:  81 yo female seen today for readmission into SNF following hospital stay for ileus with N/V and (+) E coli in urine. She also has a hx chronic diastolic HF, FTT, constipation, HTN, chronic afib, hypokalemia, HA and CKD stage 3 with anemia of chronic disease. She had a CT A/P which revealed ileus at sigmoid colon but no volvulus or obstruction. CT head was neg for acute process but has old right basal ganglia infarct and chronic small vessel ischemic changes. Repeat abdominal xray showed improvement in ileus.  Today, she c/o constipation although she had good BM while admitted and once returning to SNF. No N/V or abdominal pain. She continues to have intermittent HA. (+) back pain while sitting on bedpan today. No f/c. Appetite ok. She did not sleep well last night due to HA.   afib - rate controlled on coreg  Chronic HF - compensated. Lasix dose reduced to daily  HTN - BP controlled on norvasc, coreg and lasix  Depression/dementia - mood stable on lexapro. Trazodone  helps her sleep  HA - takes Tylenol prn  Constipation - on linzess 164m and senna and prn dulcolax supp  Seasonal allergy - stable on loratidine  She has an ASA allergy. Takes vitamins/minerals.   Past Medical History  Diagnosis Date  . Atrial fibrillation   . Altered  mental status   . Encephalopathy   . Parkinson disease   . Hypertension   . GERD (gastroesophageal reflux disease)   . Hyperlipemia   . Diabetes mellitus   . Coronary artery disease   . History of adenomatous polyp of colon 06/24/99  . Enlarged heart   . DEMENTIA   . Edema of lower extremity 07/13/11    right leg more swollen than left leg  . Esophageal dysmotility 07/02/12  . Horseshoe kidney   . Pancreatic lesion 05/22/11    no further workup  per PCP/family due to age  . Anemia   . Peripheral neuropathy   . Depression   . Thrombophlebitis   . TIA (transient ischemic attack) 12/19/2012  . Sigmoid volvulus 05/24/2013    Past Surgical History  Procedure Laterality Date  . Shoulder surgery  2001    left clavicle excision and acromioplasty  . Abdominal hysterectomy    . Cholecystectomy    . Total hip arthroplasty    . Breast surgery      2 benign tumors removed left breast  . Foot surgery      benign tumors from foot  . Esophageal dilation      several times by Dr. SLyla Son . Nose surgery    . Esophagogastroduodenoscopy (egd) with esophageal dilation N/A 08/01/2012    Procedure: ESOPHAGOGASTRODUODENOSCOPY (EGD) WITH ESOPHAGEAL DILATION;  Surgeon: DLafayette Dragon MD;  Location: [facility];  Service: Endoscopy;  Laterality: N/A;  with c-arm savory dilators  . Flexible sigmoidoscopy N/A 05/24/2013    Procedure: FLEXIBLE SIGMOIDOSCOPY;  Surgeon: [name], MD;  Location: [facility];  Service: Endoscopy;  Laterality: N/A;  . Flexible sigmoidoscopy N/A 05/26/2013    Procedure:  flex with decompression of sigmoid volvulus;  Surgeon: [name], MD;  Location: [facility];  Service: Endoscopy;  Laterality: N/A;    Patient Care Team: Gerlene Fee, NP as Nurse Practitioner (Nurse Practitioner)  Social History   Social History  . Marital Status: Widowed    Spouse Name: N/A  . Number of Children: 5  . Years of Education: 10th   Occupational History  .  Retired    Social History Main Topics  . Smoking status: Former Smoker    Quit date: 10/09/1964  . Smokeless tobacco: Never Used  . Alcohol Use: No  . Drug Use: No  . Sexual Activity: No   Other Topics Concern  . Not on file   Social History Narrative   Pt lives at [facility] and [place].   Caffeine Use: very small amount daily     reports that she quit smoking about 50 years ago. She has never used smokeless tobacco. She reports that she does not drink alcohol or use illicit drugs.  Family History  Problem Relation Age of Onset  . Cancer    . Heart disease     Family Status  Relation Status Death Age  . Mother Deceased 60    blood clots  . Father Deceased 70    cirrohis of the liver    Immunization History  Administered Date(s) Administered  . PPD Test 07/28/2013    Allergies  Allergen Reactions  . Aspirin Other (See Comments)    G.I. Upset only    Medications: Patient's Medications  New Prescriptions   No medications on file  Previous Medications   ACETAMINOPHEN (TYLENOL) 325 MG TABLET    Take 2 tablets (650 mg total) by mouth every 6 (six) hours as needed for mild pain.   AMLODIPINE (NORVASC) 2.5 MG TABLET    Take 1 tablet (2.5 mg total) by mouth daily.   ASPIRIN 81 MG CHEWABLE TABLET    Chew 81 mg by mouth daily.   BACLOFEN (LIORESAL) 10 MG TABLET    Take 5 mg by mouth at bedtime.   BISACODYL (DULCOLAX) 10 MG SUPPOSITORY    Place 10 mg rectally as needed for mild constipation or moderate constipation.   CARVEDILOL (COREG) 3.125 MG TABLET    Take 3.125 mg by mouth daily.   CHOLECALCIFEROL (VITAMIN D) 1000 UNITS TABLET    Take 1,000 Units by mouth daily.   CLOPIDOGREL (PLAVIX) 75 MG TABLET    Take 75 mg by mouth daily.    CRANBERRY 475 MG CAPS    Take 1 capsule by mouth 2 (two) times daily.   DICLOFENAC SODIUM (VOLTAREN) 1 % GEL    Apply 2 g topically 2 (two) times daily as needed (left neck pain).    DORZOLAMIDE (TRUSOPT) 2 % OPHTHALMIC SOLUTION     Place 1 drop into both eyes 3 (three) times daily.    ESCITALOPRAM (LEXAPRO) 10 MG TABLET    Take 1 tablet (10 mg total) by mouth daily.   FERROUS SULFATE 325 (65 FE) MG TABLET    Take 325 mg by mouth daily with breakfast.   FLUTICASONE (FLONASE) 50 MCG/ACT NASAL SPRAY    Place 2 sprays into both nostrils at bedtime.  FUROSEMIDE (LASIX) 40 MG TABLET    Take 1 tablet (40 mg total) by mouth daily.   LIDOCAINE (LIDODERM) 5 %    Place 1 patch onto the skin daily. Apply 1 patch to left lower back/hip every morning.  Remove & Discard patch within 12 hours or as directed by MD   LINACLOTIDE (LINZESS) 145 MCG CAPS CAPSULE    Take 145 mcg by mouth daily.   LORATADINE (CLARITIN) 10 MG TABLET    Take 10 mg by mouth daily.   MULTIPLE VITAMINS-MINERALS (CERTAGEN PO)    Take 1 tablet by mouth daily.    NON FORMULARY    Take 90 mLs by mouth 2 (two) times daily. 2 cal supplement   ONDANSETRON (ZOFRAN) 4 MG TABLET    Take 1 tablet (4 mg total) by mouth every 6 (six) hours as needed for nausea.   POLYETHYL GLYCOL-PROPYL GLYCOL (SYSTANE) 0.4-0.3 % SOLN    Place 1 drop into both eyes 4 (four) times daily as needed (dry eyes).   POTASSIUM CHLORIDE SA (K-DUR,KLOR-CON) 20 MEQ TABLET    Take 20 mEq by mouth daily.   SENNA (SENOKOT) 8.6 MG TABS TABLET    Take 1 tablet (8.6 mg total) by mouth 2 (two) times daily.   SODIUM CHLORIDE (OCEAN) 0.65 % SOLN NASAL SPRAY    Place 1 spray into both nostrils 4 (four) times daily. And every 4 hours as needed   TRAVOPROST, BAK FREE, (TRAVATAN) 0.004 % SOLN OPHTHALMIC SOLUTION    Place 1 drop into both eyes at bedtime.    TRAZODONE (DESYREL) 50 MG TABLET    Take 0.5 tablets (25 mg total) by mouth at bedtime as needed for sleep.   VITAMIN B-12 (CYANOCOBALAMIN) 500 MCG TABLET    Take 500 mcg by mouth daily.   VITAMIN C (ASCORBIC ACID) 500 MG TABLET    Take 500 mg by mouth daily.  Modified Medications   No medications on file  Discontinued Medications   No medications on file     Review of Systems  Unable to perform ROS: Dementia    Filed Vitals:   03/01/15 1549  BP: 117/61  Pulse: 61  Temp: 96.4 F (35.8 C)  Weight: 150 lb (68.04 kg)  SpO2: 96%   Body mass index is 23.49 kg/(m^2).  Physical Exam  Constitutional: She appears well-developed and well-nourished. No distress.  Frail appearing in NAD. Lying in bed on bedpan. Looks uncomfortable  HENT:  Mouth/Throat: Oropharynx is clear and moist. No oropharyngeal exudate.  Eyes: Pupils are equal, round, and reactive to light. No scleral icterus.  Neck: Neck supple. Carotid bruit is not present. No tracheal deviation present.  Cardiovascular: Normal rate, regular rhythm and intact distal pulses.  Exam reveals no gallop and no friction rub.   Murmur (1/6 SEM) heard. No LE edema b/l. no calf TTP.   Pulmonary/Chest: Effort normal and breath sounds normal. No stridor. No respiratory distress. She has no wheezes. She has no rales.  Abdominal: She exhibits distension (with tympany to percussion). She exhibits no mass. Bowel sounds are increased. There is no hepatomegaly. There is no tenderness. There is no rebound and no guarding.  Musculoskeletal: She exhibits edema and tenderness.  Lymphadenopathy:    She has no cervical adenopathy.  Neurological: She is alert.  Skin: Skin is warm and dry. No rash noted.  Psychiatric: She has a normal mood and affect. Her behavior is normal.     Labs reviewed: Admission on 02/26/2015, Discharged on  02/28/2015  Component Date Value Ref Range Status  . Sodium 02/26/2015 141  135 - 145 mmol/L Final  . Potassium 02/26/2015 3.4* 3.5 - 5.1 mmol/L Final  . Chloride 02/26/2015 108  101 - 111 mmol/L Final  . CO2 02/26/2015 24  22 - 32 mmol/L Final  . Glucose, Bld 02/26/2015 160* 65 - 99 mg/dL Final  . BUN 02/26/2015 23* 6 - 20 mg/dL Final  . Creatinine, Ser 02/26/2015 1.28* 0.44 - 1.00 mg/dL Final  . Calcium 02/26/2015 9.4  8.9 - 10.3 mg/dL Final  . Total Protein 02/26/2015  7.3  6.5 - 8.1 g/dL Final  . Albumin 02/26/2015 3.9  3.5 - 5.0 g/dL Final  . AST 02/26/2015 17  15 - 41 U/L Final  . ALT 02/26/2015 10* 14 - 54 U/L Final  . Alkaline Phosphatase 02/26/2015 67  38 - 126 U/L Final  . Total Bilirubin 02/26/2015 0.5  0.3 - 1.2 mg/dL Final  . GFR calc non Af Amer 02/26/2015 36* >60 mL/min Final  . GFR calc Af Amer 02/26/2015 42* >60 mL/min Final   Comment: (NOTE) The eGFR has been calculated using the CKD EPI equation. This calculation has not been validated in all clinical situations. eGFR's persistently <60 mL/min signify possible Chronic Kidney Disease.   . Anion gap 02/26/2015 9  5 - 15 Final  . WBC 02/26/2015 5.5  4.0 - 10.5 K/uL Final  . RBC 02/26/2015 3.12* 3.87 - 5.11 MIL/uL Final  . Hemoglobin 02/26/2015 10.7* 12.0 - 15.0 g/dL Final  . HCT 02/26/2015 32.1* 36.0 - 46.0 % Final  . MCV 02/26/2015 102.9* 78.0 - 100.0 fL Final  . MCH 02/26/2015 34.3* 26.0 - 34.0 pg Final  . MCHC 02/26/2015 33.3  30.0 - 36.0 g/dL Final  . RDW 02/26/2015 13.8  11.5 - 15.5 % Final  . Platelets 02/26/2015 182  150 - 400 K/uL Final  . Neutrophils Relative % 02/26/2015 63   Final  . Neutro Abs 02/26/2015 3.5  1.7 - 7.7 K/uL Final  . Lymphocytes Relative 02/26/2015 23   Final  . Lymphs Abs 02/26/2015 1.3  0.7 - 4.0 K/uL Final  . Monocytes Relative 02/26/2015 9   Final  . Monocytes Absolute 02/26/2015 0.5  0.1 - 1.0 K/uL Final  . Eosinophils Relative 02/26/2015 4   Final  . Eosinophils Absolute 02/26/2015 0.2  0.0 - 0.7 K/uL Final  . Basophils Relative 02/26/2015 1   Final  . Basophils Absolute 02/26/2015 0.1  0.0 - 0.1 K/uL Final  . Color, Urine 02/26/2015 YELLOW  YELLOW Final  . APPearance 02/26/2015 CLOUDY* CLEAR Final  . Specific Gravity, Urine 02/26/2015 1.016  1.005 - 1.030 Final  . pH 02/26/2015 5.0  5.0 - 8.0 Final  . Glucose, UA 02/26/2015 NEGATIVE  NEGATIVE mg/dL Final  . Hgb urine dipstick 02/26/2015 NEGATIVE  NEGATIVE Final  . Bilirubin Urine 02/26/2015  NEGATIVE  NEGATIVE Final  . Ketones, ur 02/26/2015 NEGATIVE  NEGATIVE mg/dL Final  . Protein, ur 02/26/2015 NEGATIVE  NEGATIVE mg/dL Final  . Urobilinogen, UA 02/26/2015 0.2  0.0 - 1.0 mg/dL Final  . Nitrite 02/26/2015 NEGATIVE  NEGATIVE Final  . Leukocytes, UA 02/26/2015 LARGE* NEGATIVE Final  . Specimen Description 02/26/2015 URINE, CATHETERIZED   Final  . Special Requests 02/26/2015 Normal   Final  . Culture 02/26/2015    Final                   Value:20,000 COLONIES/mL ESCHERICHIA COLI Confirmed Extended Spectrum Beta-Lactamase  Producer (ESBL) Performed at [facility]   . Report Status 02/26/2015 02/28/2015 FINAL   Final  . Organism ID, Bacteria 02/26/2015 ESCHERICHIA COLI   Final  . Squamous Epithelial / LPF 02/26/2015 MANY* RARE Final  . WBC, UA 02/26/2015 21-50  <3 WBC/hpf Final  . Bacteria, UA 02/26/2015 FEW* RARE Final  . Magnesium 02/27/2015 2.0  1.7 - 2.4 mg/dL Final  . Phosphorus 02/27/2015 3.6  2.5 - 4.6 mg/dL Final  . TSH 02/27/2015 5.235* 0.350 - 4.500 uIU/mL Final  . Sodium 02/27/2015 140  135 - 145 mmol/L Final  . Potassium 02/27/2015 3.1* 3.5 - 5.1 mmol/L Final  . Chloride 02/27/2015 107  101 - 111 mmol/L Final  . CO2 02/27/2015 25  22 - 32 mmol/L Final  . Glucose, Bld 02/27/2015 136* 65 - 99 mg/dL Final  . BUN 02/27/2015 20  6 - 20 mg/dL Final  . Creatinine, Ser 02/27/2015 1.32* 0.44 - 1.00 mg/dL Final  . Calcium 02/27/2015 8.9  8.9 - 10.3 mg/dL Final  . Total Protein 02/27/2015 6.7  6.5 - 8.1 g/dL Final  . Albumin 02/27/2015 3.4* 3.5 - 5.0 g/dL Final  . AST 02/27/2015 14* 15 - 41 U/L Final  . ALT 02/27/2015 9* 14 - 54 U/L Final  . Alkaline Phosphatase 02/27/2015 65  38 - 126 U/L Final  . Total Bilirubin 02/27/2015 0.5  0.3 - 1.2 mg/dL Final  . GFR calc non Af Amer 02/27/2015 35* >60 mL/min Final  . GFR calc Af Amer 02/27/2015 40* >60 mL/min Final   Comment: (NOTE) The eGFR has been calculated using the CKD EPI equation. This calculation has not been  validated in all clinical situations. eGFR's persistently <60 mL/min signify possible Chronic Kidney Disease.   . Anion gap 02/27/2015 8  5 - 15 Final  . WBC 02/27/2015 4.8  4.0 - 10.5 K/uL Final  . RBC 02/27/2015 2.92* 3.87 - 5.11 MIL/uL Final  . Hemoglobin 02/27/2015 9.9* 12.0 - 15.0 g/dL Final  . HCT 02/27/2015 30.2* 36.0 - 46.0 % Final  . MCV 02/27/2015 103.4* 78.0 - 100.0 fL Final  . MCH 02/27/2015 33.9  26.0 - 34.0 pg Final  . MCHC 02/27/2015 32.8  30.0 - 36.0 g/dL Final  . RDW 02/27/2015 13.7  11.5 - 15.5 % Final  . Platelets 02/27/2015 167  150 - 400 K/uL Final    Ct Abdomen Pelvis Wo Contrast  02/26/2015   CLINICAL DATA:  Acute generalized abdominal pain for 2 weeks.  EXAM: CT ABDOMEN AND PELVIS WITHOUT CONTRAST  TECHNIQUE: Multidetector CT imaging of the abdomen and pelvis was performed following the standard protocol without IV contrast.  COMPARISON:  CT scan of December 27, 2013.  FINDINGS: Visualized lung bases appear normal. Status post left hip arthroplasty.  Status post cholecystectomy. No focal abnormality is noted in the liver, spleen or pancreas is noted on these unenhanced images. Atherosclerosis of abdominal aorta is noted without aneurysm formation. Horseshoe configuration of the kidneys is noted. Adrenal glands appear normal. No hydronephrosis or renal obstruction is noted. No renal or ureteral calculi are noted. Mildly dilated sigmoid colon is noted which appears to be due to ileus. No volvulus is noted. No abnormal fluid collection is noted. Urinary bladder appears normal. Status post hysterectomy. Ovaries appear normal. No significant adenopathy is noted.  IMPRESSION: Atherosclerosis of abdominal aorta is noted without aneurysm formation.  Horseshoe configuration of the kidneys is noted. No hydronephrosis or renal obstruction is noted. No renal or ureteral calculi  are noted.  Dilated and air-filled sigmoid colon is noted most consistent with ileus. There is no evidence of  volvulus or obstruction.   Electronically Signed   By: Marijo Conception, M.D.   On: 02/26/2015 14:54   Dg Ribs Unilateral Left  02/27/2015   CLINICAL DATA:  Left lateral rib pain for 2 days.  EXAM: LEFT RIBS - 2 VIEW  COMPARISON:  01/05/2014  FINDINGS: No visible rib fractures. No pneumothorax or effusion. Visualized left lung clear. Cardiomegaly.  IMPRESSION: No visible rib fracture.   Electronically Signed   By: Rolm Baptise M.D.   On: 02/27/2015 09:36   Dg Abd 1 View  02/26/2015   CLINICAL DATA:  Initial encounter for 2 week history of abdominal pain and distention.  EXAM: ABDOMEN - 1 VIEW  COMPARISON:  12/27/2013.  FINDINGS: Supine view the abdomen shows diffuse gaseous distention of bowel, mainly representing colon. Imaging features are similar to prior study. Surgical clips in right upper quadrant compatible with prior cholecystectomy. Bones are diffusely demineralized.  IMPRESSION: Diffuse gaseous bowel distention, mainly involving colon. Imaging features may be related to chronic dysmotility, ileus, or evolving distal colonic obstruction.   Electronically Signed   By: Misty Stanley M.D.   On: 02/26/2015 13:44   Ct Head Wo Contrast  02/26/2015   CLINICAL DATA:  Headache x2 weeks, dementia  EXAM: CT HEAD WITHOUT CONTRAST  TECHNIQUE: Contiguous axial images were obtained from the base of the skull through the vertex without intravenous contrast.  COMPARISON:  MRI brain dated 01/12/2012  FINDINGS: No evidence of parenchymal hemorrhage or extra-axial fluid collection. No mass lesion, mass effect, or midline shift.  No CT evidence of acute infarction. Old right basal ganglia lacunar infarct (series 2/image 14).  Subcortical white matter and periventricular small vessel ischemic changes. Intracranial atherosclerosis.  Global cortical atrophy, likely age appropriate. No ventriculomegaly.  The visualized paranasal sinuses are essentially clear. The mastoid air cells are unopacified.  No evidence of calvarial  fracture.  IMPRESSION: No evidence of acute intracranial abnormality.  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  Small vessel ischemic changes.   Electronically Signed   By: Julian Hy M.D.   On: 02/26/2015 13:40   Dg Abd Portable 1v  02/27/2015   CLINICAL DATA:  Abdominal distension.  Follow up ileus.  EXAM: PORTABLE ABDOMEN - 1 VIEW  COMPARISON:  CT abdomen and pelvis and abdominal radiograph 02/26/2015  FINDINGS: Moderate gaseous distension of the sigmoid colon is stable to slightly improved compared to yesterday's radiograph. There is a moderate amount of stool in the more proximal colon. No definite small bowel dilatation is seen. Right upper quadrant surgical clips are noted. Left hip arthroplasty is partially visualized. Bones are osteopenic.  IMPRESSION: Stable to slightly improved gaseous distension of the sigmoid colon which may reflect ileus.   Electronically Signed   By: Logan Bores M.D.   On: 02/27/2015 10:21     Assessment/Plan   ICD-9-CM ICD-10-CM   1. Other constipation - uncontrolled 564.09 K59.09   2. Abdominal distension - improved 787.3 R14.0   3. Intractable headache, unspecified chronicity pattern, unspecified headache type - unchanged 784.0 R51   4. Dementia, without behavioral disturbance - stable 294.20 F03.90   5. Benign essential HTN - controlled 401.1 I10   6. Chronic atrial fibrillation - rate controlled 427.31 I48.2   7. Diastolic CHF, chronic - stable 428.32 I50.32    428.0    8. Depression - stable 311 F32.9     --  start colace 180m take 2 tabs po daily for constipation  --t/c reducing lexapro to 1/2 ta daily if HA does not improve.   --t/c neurology for HA if it does not improve  --cont other meds as ordered  --PT/OT/ST as indicated  --check CBC w diff and BMP as ordered  --GOAL: short term rehab then continue long term care. Communicated with pt and nursing.  --will follow  Monica S. CPerlie Gold PBingham [facility]  Adult Medicine [address] [id number]([phone] (Monday-Friday 8 AM - 5 PM) ([phone] 5 PM and follow prompts

## 2015-03-03 DIAGNOSIS — E119 Type 2 diabetes mellitus without complications: Secondary | ICD-10-CM | POA: Diagnosis not present

## 2015-03-09 ENCOUNTER — Encounter (HOSPITAL_COMMUNITY): Payer: Self-pay | Admitting: Emergency Medicine

## 2015-03-09 ENCOUNTER — Emergency Department (HOSPITAL_COMMUNITY): Payer: Medicare Other

## 2015-03-09 ENCOUNTER — Non-Acute Institutional Stay (SKILLED_NURSING_FACILITY): Payer: Medicare Other | Admitting: Adult Health

## 2015-03-09 ENCOUNTER — Emergency Department (HOSPITAL_COMMUNITY)
Admission: EM | Admit: 2015-03-09 | Discharge: 2015-03-09 | Disposition: A | Discharge status: Skilled Nursing Facility | Payer: Medicare Other | Source: Home / Self Care | Attending: Emergency Medicine | Admitting: Emergency Medicine

## 2015-03-09 ENCOUNTER — Encounter: Payer: Self-pay | Admitting: Adult Health

## 2015-03-09 DIAGNOSIS — N3000 Acute cystitis without hematuria: Secondary | ICD-10-CM

## 2015-03-09 DIAGNOSIS — K922 Gastrointestinal hemorrhage, unspecified: Secondary | ICD-10-CM | POA: Diagnosis not present

## 2015-03-09 DIAGNOSIS — E119 Type 2 diabetes mellitus without complications: Secondary | ICD-10-CM | POA: Insufficient documentation

## 2015-03-09 DIAGNOSIS — Z7982 Long term (current) use of aspirin: Secondary | ICD-10-CM | POA: Insufficient documentation

## 2015-03-09 DIAGNOSIS — R51 Headache: Secondary | ICD-10-CM

## 2015-03-09 DIAGNOSIS — Z86018 Personal history of other benign neoplasm: Secondary | ICD-10-CM

## 2015-03-09 DIAGNOSIS — Z8673 Personal history of transient ischemic attack (TIA), and cerebral infarction without residual deficits: Secondary | ICD-10-CM | POA: Insufficient documentation

## 2015-03-09 DIAGNOSIS — Z862 Personal history of diseases of the blood and blood-forming organs and certain disorders involving the immune mechanism: Secondary | ICD-10-CM

## 2015-03-09 DIAGNOSIS — F0390 Unspecified dementia without behavioral disturbance: Secondary | ICD-10-CM | POA: Insufficient documentation

## 2015-03-09 DIAGNOSIS — K8689 Other specified diseases of pancreas: Secondary | ICD-10-CM | POA: Diagnosis not present

## 2015-03-09 DIAGNOSIS — G20 Parkinson's disease: Secondary | ICD-10-CM

## 2015-03-09 DIAGNOSIS — Z7902 Long term (current) use of antithrombotics/antiplatelets: Secondary | ICD-10-CM | POA: Insufficient documentation

## 2015-03-09 DIAGNOSIS — Z8719 Personal history of other diseases of the digestive system: Secondary | ICD-10-CM | POA: Insufficient documentation

## 2015-03-09 DIAGNOSIS — E46 Unspecified protein-calorie malnutrition: Secondary | ICD-10-CM | POA: Diagnosis not present

## 2015-03-09 DIAGNOSIS — Z87891 Personal history of nicotine dependence: Secondary | ICD-10-CM

## 2015-03-09 DIAGNOSIS — Z79899 Other long term (current) drug therapy: Secondary | ICD-10-CM

## 2015-03-09 DIAGNOSIS — I4891 Unspecified atrial fibrillation: Secondary | ICD-10-CM

## 2015-03-09 DIAGNOSIS — L89152 Pressure ulcer of sacral region, stage 2: Secondary | ICD-10-CM | POA: Diagnosis not present

## 2015-03-09 DIAGNOSIS — I10 Essential (primary) hypertension: Secondary | ICD-10-CM

## 2015-03-09 DIAGNOSIS — I5032 Chronic diastolic (congestive) heart failure: Secondary | ICD-10-CM | POA: Diagnosis not present

## 2015-03-09 DIAGNOSIS — R109 Unspecified abdominal pain: Secondary | ICD-10-CM | POA: Diagnosis not present

## 2015-03-09 DIAGNOSIS — K264 Chronic or unspecified duodenal ulcer with hemorrhage: Secondary | ICD-10-CM | POA: Diagnosis not present

## 2015-03-09 DIAGNOSIS — Z791 Long term (current) use of non-steroidal anti-inflammatories (NSAID): Secondary | ICD-10-CM | POA: Insufficient documentation

## 2015-03-09 DIAGNOSIS — N390 Urinary tract infection, site not specified: Secondary | ICD-10-CM | POA: Diagnosis not present

## 2015-03-09 DIAGNOSIS — R195 Other fecal abnormalities: Secondary | ICD-10-CM | POA: Diagnosis not present

## 2015-03-09 DIAGNOSIS — K921 Melena: Secondary | ICD-10-CM | POA: Diagnosis not present

## 2015-03-09 DIAGNOSIS — R112 Nausea with vomiting, unspecified: Secondary | ICD-10-CM | POA: Diagnosis not present

## 2015-03-09 DIAGNOSIS — R110 Nausea: Secondary | ICD-10-CM | POA: Diagnosis not present

## 2015-03-09 DIAGNOSIS — F329 Major depressive disorder, single episode, unspecified: Secondary | ICD-10-CM

## 2015-03-09 DIAGNOSIS — R140 Abdominal distension (gaseous): Secondary | ICD-10-CM | POA: Diagnosis not present

## 2015-03-09 DIAGNOSIS — I110 Hypertensive heart disease with heart failure: Secondary | ICD-10-CM | POA: Diagnosis not present

## 2015-03-09 DIAGNOSIS — K5900 Constipation, unspecified: Secondary | ICD-10-CM | POA: Diagnosis not present

## 2015-03-09 DIAGNOSIS — Q631 Lobulated, fused and horseshoe kidney: Secondary | ICD-10-CM | POA: Insufficient documentation

## 2015-03-09 DIAGNOSIS — R1032 Left lower quadrant pain: Secondary | ICD-10-CM | POA: Diagnosis not present

## 2015-03-09 DIAGNOSIS — R519 Headache, unspecified: Secondary | ICD-10-CM

## 2015-03-09 DIAGNOSIS — K567 Ileus, unspecified: Secondary | ICD-10-CM | POA: Diagnosis not present

## 2015-03-09 DIAGNOSIS — Z7951 Long term (current) use of inhaled steroids: Secondary | ICD-10-CM

## 2015-03-09 DIAGNOSIS — I2510 Atherosclerotic heart disease of native coronary artery without angina pectoris: Secondary | ICD-10-CM | POA: Insufficient documentation

## 2015-03-09 LAB — CBC
HCT: 30.3 % — ABNORMAL LOW (ref 36.0–46.0)
HEMOGLOBIN: 10.2 g/dL — AB (ref 12.0–15.0)
MCH: 34.7 pg — AB (ref 26.0–34.0)
MCHC: 33.7 g/dL (ref 30.0–36.0)
MCV: 103.1 fL — ABNORMAL HIGH (ref 78.0–100.0)
PLATELETS: 221 10*3/uL (ref 150–400)
RBC: 2.94 MIL/uL — ABNORMAL LOW (ref 3.87–5.11)
RDW: 14.7 % (ref 11.5–15.5)
WBC: 5.8 10*3/uL (ref 4.0–10.5)

## 2015-03-09 LAB — COMPREHENSIVE METABOLIC PANEL
ALBUMIN: 3.7 g/dL (ref 3.5–5.0)
ALT: 9 U/L — AB (ref 14–54)
ANION GAP: 6 (ref 5–15)
AST: 18 U/L (ref 15–41)
Alkaline Phosphatase: 72 U/L (ref 38–126)
BILIRUBIN TOTAL: 0.6 mg/dL (ref 0.3–1.2)
BUN: 14 mg/dL (ref 6–20)
CALCIUM: 9.2 mg/dL (ref 8.9–10.3)
CO2: 22 mmol/L (ref 22–32)
CREATININE: 1.07 mg/dL — AB (ref 0.44–1.00)
Chloride: 111 mmol/L (ref 101–111)
GFR calc Af Amer: 52 mL/min — ABNORMAL LOW (ref >60)
GFR calc non Af Amer: 45 mL/min — ABNORMAL LOW (ref >60)
GLUCOSE: 137 mg/dL — AB (ref 65–99)
Potassium: 3.2 mmol/L — ABNORMAL LOW (ref 3.5–5.1)
Sodium: 139 mmol/L (ref 135–145)
TOTAL PROTEIN: 7.4 g/dL (ref 6.5–8.1)

## 2015-03-09 LAB — URINALYSIS, ROUTINE W REFLEX MICROSCOPIC
BILIRUBIN URINE: NEGATIVE
GLUCOSE, UA: NEGATIVE mg/dL
KETONES UR: NEGATIVE mg/dL
Nitrite: POSITIVE — AB
PROTEIN: NEGATIVE mg/dL
Specific Gravity, Urine: 1.013 (ref 1.005–1.030)
UROBILINOGEN UA: 0.2 mg/dL (ref 0.0–1.0)
pH: 5 (ref 5.0–8.0)

## 2015-03-09 LAB — URINE MICROSCOPIC-ADD ON

## 2015-03-09 LAB — LIPASE, BLOOD: Lipase: 24 U/L (ref 22–51)

## 2015-03-09 MED ORDER — IOHEXOL 300 MG/ML  SOLN
100.0000 mL | Freq: Once | INTRAMUSCULAR | Status: AC | PRN
  Administered 2015-03-09: 100 mL via INTRAVENOUS

## 2015-03-09 MED ORDER — HYDROCODONE-ACETAMINOPHEN 5-325 MG PO TABS
1.0000 | ORAL_TABLET | Freq: Once | ORAL | Status: AC
  Administered 2015-03-09: 1 via ORAL
  Filled 2015-03-09: qty 1

## 2015-03-09 MED ORDER — ONDANSETRON HCL 4 MG/2ML IJ SOLN
4.0000 mg | Freq: Once | INTRAMUSCULAR | Status: AC | PRN
  Administered 2015-03-09: 4 mg via INTRAVENOUS
  Filled 2015-03-09: qty 2

## 2015-03-09 MED ORDER — HYDROCODONE-ACETAMINOPHEN 5-325 MG PO TABS: 1.0000 | ORAL_TABLET | Freq: Four times a day (QID) | ORAL | Status: DC | PRN

## 2015-03-09 MED ORDER — ONDANSETRON 8 MG PO TBDP
8.0000 mg | ORAL_TABLET | Freq: Three times a day (TID) | ORAL | Status: DC | PRN
Start: 2015-03-09 — End: 2015-03-11

## 2015-03-09 MED ORDER — CIPROFLOXACIN HCL 500 MG PO TABS: 500.0000 mg | ORAL_TABLET | Freq: Two times a day (BID) | ORAL | Status: DC

## 2015-03-09 MED ORDER — MORPHINE SULFATE (PF) 4 MG/ML IV SOLN: 4.0000 mg | Freq: Once | INTRAVENOUS | Status: DC

## 2015-03-09 MED ORDER — CIPROFLOXACIN HCL 500 MG PO TABS
500.0000 mg | ORAL_TABLET | Freq: Once | ORAL | Status: AC
  Administered 2015-03-09: 500 mg via ORAL
  Filled 2015-03-09: qty 1

## 2015-03-09 MED ORDER — IOHEXOL 300 MG/ML  SOLN
50.0000 mL | Freq: Once | INTRAMUSCULAR | Status: AC | PRN
  Administered 2015-03-09: 50 mL via ORAL

## 2015-03-09 MED ORDER — CEPHALEXIN 500 MG PO CAPS: 500.0000 mg | ORAL_CAPSULE | Freq: Once | ORAL | Status: DC

## 2015-03-09 NOTE — ED Provider Notes (Signed)
CSN: [id number]     Arrival date & time 03/09/15  1206 History   First MD Initiated Contact with Patient 03/09/15 1253     Chief Complaint  Patient presents with  . Headache  . Abdominal Pain  . Constipation     HPI Patient presents to the emergency department from [facility].  She reports some possible constipation as well as diffuse generalized lower abdominal pain.  She has a mild left-sided headache.  Patient denies abdominal swelling.  She reports nausea without vomiting.  She denies diarrhea.  She reports urinary frequency without dysuria.  No fevers or chills.  No hematemesis.  No melena or hematochezia.  Denies chest pain shortness of breath.  His symptoms are moderate in severity.   Past Medical History  Diagnosis Date  . Atrial fibrillation ([place])   . Altered mental status   . Encephalopathy   . Parkinson disease ([place])   . Hypertension   . GERD (gastroesophageal reflux disease)   . Hyperlipemia   . Diabetes mellitus   . Coronary artery disease   . History of adenomatous polyp of colon 06/24/99  . Enlarged heart   . DEMENTIA   . Edema of lower extremity 07/13/11    right leg more swollen than left leg  . Esophageal dysmotility 07/02/12  . Horseshoe kidney   . Pancreatic lesion 05/22/11    no further workup  per PCP/family due to age  . Anemia   . Peripheral neuropathy ([place])   . Depression   . Thrombophlebitis   . TIA (transient ischemic attack) 12/19/2012  . Sigmoid volvulus ([place]) 05/24/2013   Past Surgical History  Procedure Laterality Date  . Shoulder surgery  2001    left clavicle excision and acromioplasty  . Abdominal hysterectomy    . Cholecystectomy    . Total hip arthroplasty    . Breast surgery      2 benign tumors removed left breast  . Foot surgery      benign tumors from foot  . Esophageal dilation      several times by Dr. [name]  . Nose surgery    . Esophagogastroduodenoscopy (egd) with esophageal dilation N/A 08/01/2012     Procedure: ESOPHAGOGASTRODUODENOSCOPY (EGD) WITH ESOPHAGEAL DILATION;  Surgeon: [name], MD;  Location: [facility];  Service: Endoscopy;  Laterality: N/A;  with c-arm savory dilators  . Flexible sigmoidoscopy N/A 05/24/2013    Procedure: FLEXIBLE SIGMOIDOSCOPY;  Surgeon: [name], MD;  Location: [facility];  Service: Endoscopy;  Laterality: N/A;  . Flexible sigmoidoscopy N/A 05/26/2013    Procedure:  flex with decompression of sigmoid volvulus;  Surgeon: [name], MD;  Location[facility];  Service: Endoscopy;  Laterality: N/A;   Family History  Problem Relation Age of Onset  . Cancer    . Heart disease     Social History  Substance Use Topics  . Smoking status: Former Smoker    Quit date: 10/09/1964  . Smokeless tobacco: Never Used  . Alcohol Use: No   OB History    No data available     Review of Systems  All other systems reviewed and are negative.     Allergies  Aspirin  Home Medications   Prior to Admission medications   Medication Sig Start Date End Date Taking? Authorizing Provider  acetaminophen (TYLENOL) 325 MG tablet Take 2 tablets (650 mg total) by mouth every 6 (six) hours as needed for mild pain. Patient taking differently: Take  650 mg by mouth 2 (two) times daily. For neck pain. 11/03/14  Yes Gerlene Fee, NP  amLODipine (NORVASC) 2.5 MG tablet Take 1 tablet (2.5 mg total) by mouth daily. 01/06/14  Yes Barton Dubois, MD  aspirin 81 MG chewable tablet Chew 81 mg by mouth daily.   Yes Historical Provider, MD  baclofen (LIORESAL) 10 MG tablet Take 5 mg by mouth at bedtime.   Yes Historical Provider, MD  bisacodyl (DULCOLAX) 10 MG suppository Place 10 mg rectally as needed for mild constipation or moderate constipation.   Yes Historical Provider, MD  carvedilol (COREG) 3.125 MG tablet Take 3.125 mg by mouth daily.   Yes Historical Provider, MD  cholecalciferol (VITAMIN D) 1000 UNITS tablet Take 1,000 Units by mouth daily.   Yes Historical  Provider, MD  clopidogrel (PLAVIX) 75 MG tablet Take 75 mg by mouth daily.    Yes Historical Provider, MD  Cranberry 475 MG CAPS Take 1 capsule by mouth 2 (two) times daily.   Yes Historical Provider, MD  diclofenac sodium (VOLTAREN) 1 % GEL Apply 2 g topically 2 (two) times daily.    Yes Historical Provider, MD  docusate sodium (COLACE) 100 MG capsule Take 200 mg by mouth daily.   Yes Historical Provider, MD  dorzolamide (TRUSOPT) 2 % ophthalmic solution Place 1 drop into both eyes 3 (three) times daily.    Yes Historical Provider, MD  escitalopram (LEXAPRO) 10 MG tablet Take 1 tablet (10 mg total) by mouth daily. 12/03/14  Yes Gerlene Fee, NP  ferrous sulfate 325 (65 FE) MG tablet Take 325 mg by mouth daily with breakfast.   Yes Historical Provider, MD  fluticasone (FLONASE) 50 MCG/ACT nasal spray Place 2 sprays into both nostrils at bedtime.   Yes Historical Provider, MD  furosemide (LASIX) 40 MG tablet Take 1 tablet (40 mg total) by mouth daily. 02/28/15  Yes Robbie Lis, MD  lidocaine (LIDODERM) 5 % Place 1 patch onto the skin daily. Apply 1 patch to left lower back/hip every morning.  Remove & Discard patch within 12 hours or as directed by MD 07/28/13  Yes Thurnell Lose, MD  Linaclotide [facility]) 145 MCG CAPS capsule Take 145 mcg by mouth daily.   Yes Historical Provider, MD  loratadine (CLARITIN) 10 MG tablet Take 10 mg by mouth daily.   Yes Historical Provider, MD  Multiple Vitamins-Minerals (CERTAGEN PO) Take 1 tablet by mouth daily.    Yes Historical Provider, MD  NON FORMULARY Take 90 mLs by mouth 2 (two) times daily. 2 cal supplement   Yes Historical Provider, MD  ondansetron (ZOFRAN) 4 MG tablet Take 1 tablet (4 mg total) by mouth every 6 (six) hours as needed for nausea. 02/28/15  Yes Robbie Lis, MD  Polyethyl Glycol-Propyl Glycol (SYSTANE) 0.4-0.3 % SOLN Place 1 drop into both eyes 4 (four) times daily.    Yes Historical Provider, MD  potassium chloride SA (K-DUR,KLOR-CON) 20  MEQ tablet Take 20 mEq by mouth daily. 10/23/13  Yes Janece Canterbury, MD  senna (SENOKOT) 8.6 MG TABS tablet Take 1 tablet (8.6 mg total) by mouth 2 (two) times daily. 01/05/14  Yes Barton Dubois, MD  sodium chloride (OCEAN) 0.65 % SOLN nasal spray Place 1 spray into both nostrils 4 (four) times daily. And every 4 hours as needed to moisturize nasal passages.   Yes Historical Provider, MD  Travoprost, BAK Free, (TRAVATAN) 0.004 % SOLN ophthalmic solution Place 1 drop into both eyes at bedtime.  Yes Historical Provider, MD  traZODone (DESYREL) 50 MG tablet Take 0.5 tablets (25 mg total) by mouth at bedtime as needed for sleep. Patient taking differently: Take 25 mg by mouth at bedtime.  11/03/14  Yes Gerlene Fee, NP  vitamin B-12 (CYANOCOBALAMIN) 500 MCG tablet Take 500 mcg by mouth daily.   Yes Historical Provider, MD  vitamin C (ASCORBIC ACID) 500 MG tablet Take 500 mg by mouth daily.   Yes Historical Provider, MD  ciprofloxacin (CIPRO) 500 MG tablet Take 1 tablet (500 mg total) by mouth 2 (two) times daily. 03/09/15   Jola Schmidt, MD  HYDROcodone-acetaminophen (NORCO/VICODIN) 5-325 MG tablet Take 1 tablet by mouth every 6 (six) hours as needed for moderate pain. 03/09/15   Jola Schmidt, MD  ondansetron (ZOFRAN ODT) 8 MG disintegrating tablet Take 1 tablet (8 mg total) by mouth every 8 (eight) hours as needed for nausea or vomiting. 03/09/15   Jola Schmidt, MD   BP 152/63 mmHg  Pulse 69  Temp(Src) 98.7 F (37.1 C) (Oral)  Resp 17  SpO2 100% Physical Exam  Constitutional: She is oriented to person, place, and time. She appears well-developed and well-nourished. No distress.  HENT:  Head: Normocephalic and atraumatic.  Eyes: EOM are normal.  Neck: Normal range of motion.  Cardiovascular: Normal rate, regular rhythm and normal heart sounds.   Pulmonary/Chest: Effort normal and breath sounds normal.  Abdominal: Soft. She exhibits no distension.  Generalized lower abdominal tenderness left  greater than right  Musculoskeletal: Normal range of motion.  Neurological: She is alert and oriented to person, place, and time.  Skin: Skin is warm and dry.  Psychiatric: She has a normal mood and affect. Judgment normal.  Nursing note and vitals reviewed.   ED Course  Procedures (including critical care time) Labs Review Labs Reviewed  COMPREHENSIVE METABOLIC PANEL - Abnormal; Notable for the following:    Potassium 3.2 (*)    Glucose, Bld 137 (*)    Creatinine, Ser 1.07 (*)    ALT 9 (*)    GFR calc non Af Amer 45 (*)    GFR calc Af Amer 52 (*)    All other components within normal limits  CBC - Abnormal; Notable for the following:    RBC 2.94 (*)    Hemoglobin 10.2 (*)    HCT 30.3 (*)    MCV 103.1 (*)    MCH 34.7 (*)    All other components within normal limits  URINALYSIS, ROUTINE W REFLEX MICROSCOPIC (NOT AT [facility]) - Abnormal; Notable for the following:    APPearance TURBID (*)    Hgb urine dipstick SMALL (*)    Nitrite POSITIVE (*)    Leukocytes, UA LARGE (*)    All other components within normal limits  URINE MICROSCOPIC-ADD ON - Abnormal; Notable for the following:    Bacteria, UA MANY (*)    All other components within normal limits  URINE CULTURE  LIPASE, BLOOD    Imaging Review Ct Abdomen Pelvis W Contrast  03/09/2015   CLINICAL DATA:  Mid abdomen pain, constipation since yesterday.  EXAM: CT ABDOMEN AND PELVIS WITH CONTRAST  TECHNIQUE: Multidetector CT imaging of the abdomen and pelvis was performed using the standard protocol following bolus administration of intravenous contrast.  CONTRAST:  160mL OMNIPAQUE IOHEXOL 300 MG/ML  SOLN  COMPARISON:  February 26, 2015, Oct 09, 2013, July 24, 2013  FINDINGS: The liver, spleen, adrenal glands are normal. Horseshoe kidney is identified unchanged. Patient is status post  prior cholecystectomy. There is a low-density mass in the body of pancreas measuring 2.4 x 1.5 cm unchanged compared to prior CT of July 24, 2013. Atherosclerosis of the abdominal aorta is identified without aneurysmal dilatation. There is no abdominal lymphadenopathy.  There is no small bowel obstruction or diverticulitis. The appendix is not seen but no inflammation is noted around the cecum. Mild bowel content is identified in the colon.  Fluid-filled bladder is normal. Pelvic phleboliths are identified. Left hip replacement is noted. There is mild atelectasis or scar of the left lung base. There is no focal pneumonia or pleural effusion. The heart size is enlarged. There is a small pericardial effusion. Images of the bones demonstrate focal sclerosis of the superior endplate of L4 unchanged since February 2015. There is lucency in the L2 vertebral body unchanged since February 2015.  IMPRESSION: There is no small bowel obstruction or diverticulitis. Mild bowel content is identified in the colon.  Low-density mass in the body of pancreas measuring 2.4 x 1.5 cm unchanged compared to prior CT of July 24, 2013. If clinically indicated, consider further evaluation with MRI of pancreas.   Electronically Signed   By: Abelardo Diesel M.D.   On: 03/09/2015 14:57   I have personally reviewed and evaluated these images and lab results as part of my medical decision-making.   EKG Interpretation None      MDM   Final diagnoses:  Abdominal pain, unspecified abdominal location  Acute cystitis without hematuria    4:36 PM Patient is feeling better this time.  Discharge home in good condition.  CT scan without acute pathology.  Urinary tract infection present.  Urine culture sent.  Last urine culture demonstrated Escherichia coli with some resistance.  It was sensitive to ciprofloxacin however.  Patient be placed on Cipro.  Primary care follow-up.  She understands to return to the ER for new or worsening symptoms    Jola Schmidt, MD 03/09/15 [phone]

## 2015-03-09 NOTE — ED Notes (Signed)
Per EMS pt comes from [facility] for left side headache, abd pain and possible constipation.  Pt's daughter reported to EMS that pt was evaluated by PA at facility this am and said "bowel sounds sounded like a tin can".  Pt was here last week that required for admission per daughter for "large intestines a sleep".  Pt states that her headache has been going on a long time and this morning she was trying to have a BM and her stomach was very painful. Pt reports that she was unable to have BM.

## 2015-03-09 NOTE — ED Notes (Signed)
Patient transported to CT 

## 2015-03-09 NOTE — ED Notes (Signed)
PTAR made aware of transport back to [facility]

## 2015-03-09 NOTE — Progress Notes (Signed)
Patient ID: Maria Christian, female   DOB: [birth year], 81 y.o.   MRN: [id number]    Facility: [facility]      Allergies  Allergen Reactions  . Aspirin Other (See Comments)    [name]    Chief Complaint  Patient presents with  . Acute Visit    patient status     HPI:  She continues to have issues with nausea and headaches. the left side of her  head is painful and is tender to palpation.she did have a small nose bleed this morning.  She states her bowels did move yesterday; but she continues to have tenderness present and left lower quad pain present as well. Her family is concerned that she is not getting better and would for her to be admitted to the hospital. I did tell the family that I could send her to the ED but I could not promise that an admission would occur. The verbalized understanding.  There are no reports of fever present.    Past Medical History  Diagnosis Date  . Atrial fibrillation ([place])   . Altered mental status   . Encephalopathy   . Parkinson disease ([place])   . Hypertension   . GERD (gastroesophageal reflux disease)   . Hyperlipemia   . Diabetes mellitus   . Coronary artery disease   . History of adenomatous polyp of colon 06/24/99  . Enlarged heart   . DEMENTIA   . Edema of lower extremity 07/13/11    right leg more swollen than left leg  . Esophageal dysmotility 07/02/12  . Horseshoe kidney   . Pancreatic lesion 05/22/11    no further workup  per PCP/family due to age  . Anemia   . Peripheral neuropathy ([place])   . Depression   . Thrombophlebitis   . TIA (transient ischemic attack) 12/19/2012  . Sigmoid volvulus ([place]) 05/24/2013    Past Surgical History  Procedure Laterality Date  . Shoulder surgery  2001    left clavicle excision and acromioplasty  . Abdominal hysterectomy    . Cholecystectomy    . Total hip arthroplasty    . Breast surgery      2 benign tumors removed left breast  . Foot surgery      benign tumors from  foot  . Esophageal dilation      several times by Dr. [name]  . Nose surgery    . Esophagogastroduodenoscopy (egd) with esophageal dilation N/A 08/01/2012    Procedure: ESOPHAGOGASTRODUODENOSCOPY (EGD) WITH ESOPHAGEAL DILATION;  Surgeon: [name], MD;  [facility];  Service: Endoscopy;  Laterality: N/A;  with c-arm savory dilators  . Flexible sigmoidoscopy N/A 05/24/2013    Procedure: FLEXIBLE SIGMOIDOSCOPY;  Surgeon: [name], MD;  [facility]: [facility];  Service: Endoscopy;  Laterality: N/A;  . Flexible sigmoidoscopy N/A 05/26/2013    Procedure:  flex with decompression of sigmoid volvulus;  Surgeon: [name], MD;  [facility];  Service: Endoscopy;  Laterality: N/A;    VITAL SIGNS BP 112/44 mmHg  Pulse 80  Temp(Src) 96.4 F (35.8 C)  Resp 18  Ht 5\' 7"  (1.702 m)  Wt 148 lb (67.132 kg)  BMI 23.17 kg/m2  SpO2 98%  Patient's Medications  New Prescriptions   No medications on file  Previous Medications   ACETAMINOPHEN (TYLENOL) 325 MG TABLET    Take 2 tablets (650 mg total) by mouth every 6 (six) hours as needed for mild pain.  AMLODIPINE (NORVASC) 2.5 MG TABLET    Take 1 tablet (2.5 mg total) by mouth daily.   ASPIRIN 81 MG CHEWABLE TABLET    Chew 81 mg by mouth daily.   BACLOFEN (LIORESAL) 10 MG TABLET    Take 5 mg by mouth at bedtime.   BISACODYL (DULCOLAX) 10 MG SUPPOSITORY    Place 10 mg rectally as needed for mild constipation or moderate constipation.   CARVEDILOL (COREG) 3.125 MG TABLET    Take 3.125 mg by mouth daily.   CHOLECALCIFEROL (VITAMIN D) 1000 UNITS TABLET    Take 1,000 Units by mouth daily.   CLOPIDOGREL (PLAVIX) 75 MG TABLET    Take 75 mg by mouth daily.    CRANBERRY 475 MG CAPS    Take 1 capsule by mouth 2 (two) times daily.   DICLOFENAC SODIUM (VOLTAREN) 1 % GEL    Apply 2 g topically 2 (two) times daily as needed (left neck pain).    DOCUSATE SODIUM (COLACE) 100 MG CAPSULE    Take 200 mg by mouth daily.    DORZOLAMIDE (TRUSOPT) 2 % OPHTHALMIC SOLUTION    Place 1 drop into both eyes 3 (three) times daily.    ESCITALOPRAM (LEXAPRO) 10 MG TABLET    Take 1 tablet (10 mg total) by mouth daily.   FERROUS SULFATE 325 (65 FE) MG TABLET    Take 325 mg by mouth daily with breakfast.   FLUTICASONE (FLONASE) 50 MCG/ACT NASAL SPRAY    Place 2 sprays into both nostrils at bedtime.   FUROSEMIDE (LASIX) 40 MG TABLET    Take 1 tablet (40 mg total) by mouth daily.   LIDOCAINE (LIDODERM) 5 %    Place 1 patch onto the skin daily. Apply 1 patch to left lower back/hip every morning.  Remove & Discard patch within 12 hours or as directed by MD   LINACLOTIDE (LINZESS) 145 MCG CAPS CAPSULE    Take 145 mcg by mouth daily.   LORATADINE (CLARITIN) 10 MG TABLET    Take 10 mg by mouth daily.   MULTIPLE VITAMINS-MINERALS (CERTAGEN PO)    Take 1 tablet by mouth daily.    NON FORMULARY    Take 90 mLs by mouth 2 (two) times daily. 2 cal supplement   ONDANSETRON (ZOFRAN) 4 MG TABLET    Take 1 tablet (4 mg total) by mouth every 6 (six) hours as needed for nausea.   POLYETHYL GLYCOL-PROPYL GLYCOL (SYSTANE) 0.4-0.3 % SOLN    Place 1 drop into both eyes 4 (four) times daily as needed (dry eyes).   POTASSIUM CHLORIDE SA (K-DUR,KLOR-CON) 20 MEQ TABLET    Take 20 mEq by mouth daily.   SENNA (SENOKOT) 8.6 MG TABS TABLET    Take 1 tablet (8.6 mg total) by mouth 2 (two) times daily.   SODIUM CHLORIDE (OCEAN) 0.65 % SOLN NASAL SPRAY    Place 1 spray into both nostrils 4 (four) times daily. And every 4 hours as needed   TRAVOPROST, BAK FREE, (TRAVATAN) 0.004 % SOLN OPHTHALMIC SOLUTION    Place 1 drop into both eyes at bedtime.    TRAZODONE (DESYREL) 50 MG TABLET    Take 0.5 tablets (25 mg total) by mouth at bedtime as needed for sleep.   VITAMIN B-12 (CYANOCOBALAMIN) 500 MCG TABLET    Take 500 mcg by mouth daily.   VITAMIN C (ASCORBIC ACID) 500 MG TABLET    Take 500 mg by mouth daily.  Modified Medications   No medications on  file    Discontinued Medications   No medications on file     SIGNIFICANT DIAGNOSTIC EXAMS   07-16-14: right hand x-ray: no acute abnormalities   10-13-14: chest x-ray: no acute cardiopulmonary process present.   12-16-14: chest x-ray: moderate cardiomegaly; with mild pulmonary venous congestion  12-21-14: chest x-ray: no acute cardiopulmonary process   01-02-15: left hand x-ray: moderate osteoarthritis   02-10-15: kub: unremarkable abdomen exam  02-15-15: abdominal ultrasound: 1. Gallbladder not visualized. 2. No hydronephrosis no renal calculus; 3. The pancreas not well visualized; which could be related to gassy abdomen but an appearance which might represent pancreatitis in the appropriate clinical setting.    LABS REVIEWED:   03-19-14: wbc 3.7; hgb 9.9; hct 33.2; mcv 108.5; plt 121; glucose 122; bun 25.7; creat 1.3; k+4.1; na++139; liver normal albumin 4.3 07-17-14: wbc 3.8; hgb 9.7; ;hct 31.;2 mcv 108; plt 113; glucose 154; bun 21.3; creat 1.43;  Liver normal albumin 4.0; tsh 6.13; hgb a1c 6.1; uric acid 7.1  08-18-14: tsh 4.09  10-13-14: wbc 4.7; hgb 9.7; hct 31.1; mcv 105.3; plt 111; glucose 176; bun 27.7; creat 1.44; k+4.4; na++143; C02: 35; liver normal albumin 4.5 12-21-14: wbc 5.1; hgb 9.7; hct 31.2; mcv 106.7; plt 129; glucose 217; bun 27.6; creat 1.37; k+ 3.8; na++135 01-06-15: hgb a1c 6.8; chol 178; ldl 110; trig 122; hdl 44 01-19-15: wbc 8.2; hgb 9.2; hct 31.4; mcv 90.2; plt 255; glucose 181; bun 24.4; creat 1.05; k+ 4.4; na++139 02-05-15: vit W38: 937; folic acid 34.2; iron 64  02-11-15: wbc 5.3; hgb 10.2; hct 31.2; mcv 102.3; plt 145 glucose 136; bun 36; creat 1.59; k+ 4.0; na++136; liver normal albumin 3.9; lipase 20; sed rate 40  02-13-15: urine culture: gram neg rod  02-26-15: wbc 5.5 ;hgb 10.7; hct 32.1; mcv 102.9; plt 182; glucose 160; bun 23; creat 1.28; k+ 3.4; na++141; liver normal albumin 3.9; urine culture: 20,000 colonies e-coli 02-26-14: wbc 4.8; hgb 9.9; hct 30.2; mcv 103.4;  plt 167; glucose 136; bun 20; creat 1.32; k+ 3.1; na++140; liver normal albumin 3.4 phos 3.6; mag 2.0; tsh 5.235        Review of Systems Constitutional: decreased appetite   HENT: Negative for congestion.   Respiratory: Negative for cough, chest tightness and shortness of breath.   Cardiovascular: Negative for chest pain, palpitations and leg swelling.  Gastrointestinal: Positive for abdominal pain. Negative for diarrhea and constipation.  Musculoskeletal: Negative for myalgias and arthralgias.  Skin: Negative for pallor.  Neurological: Positive for headaches. Negative for dizziness.  Psychiatric/Behavioral: The patient is not nervous/anxious.       Physical Exam Constitutional: No distress.  HENT:  Head: Normocephalic and atraumatic.  Right Ear: External ear normal.  Left Ear: External ear normal.  Nose: Nose normal.  Has scalp tenderness left temporal tenderness Has left facial tenderness   Eyes: Conjunctivae are normal.  Neck: Neck supple. No JVD present. No thyromegaly present.  Cardiovascular: Normal rate and intact distal pulses.   Heart rate irregular   Respiratory: Effort normal and breath sounds normal. No respiratory distress. She has no wheezes.  GI: Soft. Bowel sounds are normal. She exhibits no distension. There is tenderness.  Has tenderness diffusely  Musculoskeletal: She exhibits no edema.  Able to move all extremities   Lymphadenopathy:    She has no cervical adenopathy.  Neurological: She is alert.  Skin: Skin is warm and dry. She is not diaphoretic.  Psychiatric: She has a normal mood and affect.  ASSESSMENT/ PLAN:  1. Headache 2. Abdominal pain 3. Nausea   I did discuss with her family outpatient options for her treatment including neurology consult; possible temporal arteritis etc and a continues GI workup. They have opted for an ED evaluation and treatment option instead.    Time spent with patient 50   minutes >50% time spent  counseling; reviewing medical record; tests; labs; and developing future plan of care   Ok Edwards NP [facility]  Contact [phone] Monday through Friday 8am- 5pm  After hours call [phone]

## 2015-03-11 ENCOUNTER — Emergency Department (HOSPITAL_COMMUNITY): Payer: Medicare Other

## 2015-03-11 ENCOUNTER — Inpatient Hospital Stay (HOSPITAL_COMMUNITY)
Admission: EM | Admit: 2015-03-11 | Discharge: 2015-03-15 | DRG: 378 | Disposition: A | Discharge status: Skilled Nursing Facility | Payer: Medicare Other | Attending: Family Medicine | Admitting: Family Medicine

## 2015-03-11 ENCOUNTER — Encounter (HOSPITAL_COMMUNITY): Payer: Self-pay | Admitting: Emergency Medicine

## 2015-03-11 DIAGNOSIS — G8929 Other chronic pain: Secondary | ICD-10-CM | POA: Diagnosis present

## 2015-03-11 DIAGNOSIS — J309 Allergic rhinitis, unspecified: Secondary | ICD-10-CM | POA: Diagnosis not present

## 2015-03-11 DIAGNOSIS — Z66 Do not resuscitate: Secondary | ICD-10-CM | POA: Diagnosis present

## 2015-03-11 DIAGNOSIS — D599 Acquired hemolytic anemia, unspecified: Secondary | ICD-10-CM | POA: Diagnosis not present

## 2015-03-11 DIAGNOSIS — K260 Acute duodenal ulcer with hemorrhage: Secondary | ICD-10-CM | POA: Diagnosis not present

## 2015-03-11 DIAGNOSIS — Z96642 Presence of left artificial hip joint: Secondary | ICD-10-CM | POA: Diagnosis present

## 2015-03-11 DIAGNOSIS — D62 Acute posthemorrhagic anemia: Secondary | ICD-10-CM | POA: Diagnosis present

## 2015-03-11 DIAGNOSIS — I25110 Atherosclerotic heart disease of native coronary artery with unstable angina pectoris: Secondary | ICD-10-CM | POA: Diagnosis not present

## 2015-03-11 DIAGNOSIS — M179 Osteoarthritis of knee, unspecified: Secondary | ICD-10-CM | POA: Diagnosis not present

## 2015-03-11 DIAGNOSIS — Z9049 Acquired absence of other specified parts of digestive tract: Secondary | ICD-10-CM | POA: Diagnosis not present

## 2015-03-11 DIAGNOSIS — Z886 Allergy status to analgesic agent status: Secondary | ICD-10-CM

## 2015-03-11 DIAGNOSIS — G218 Other secondary parkinsonism: Secondary | ICD-10-CM | POA: Diagnosis not present

## 2015-03-11 DIAGNOSIS — I5032 Chronic diastolic (congestive) heart failure: Secondary | ICD-10-CM | POA: Diagnosis not present

## 2015-03-11 DIAGNOSIS — L8990 Pressure ulcer of unspecified site, unspecified stage: Secondary | ICD-10-CM | POA: Diagnosis not present

## 2015-03-11 DIAGNOSIS — Z87891 Personal history of nicotine dependence: Secondary | ICD-10-CM | POA: Diagnosis not present

## 2015-03-11 DIAGNOSIS — I482 Chronic atrial fibrillation, unspecified: Secondary | ICD-10-CM | POA: Diagnosis present

## 2015-03-11 DIAGNOSIS — E1122 Type 2 diabetes mellitus with diabetic chronic kidney disease: Secondary | ICD-10-CM | POA: Diagnosis present

## 2015-03-11 DIAGNOSIS — Z90710 Acquired absence of both cervix and uterus: Secondary | ICD-10-CM | POA: Diagnosis not present

## 2015-03-11 DIAGNOSIS — I2510 Atherosclerotic heart disease of native coronary artery without angina pectoris: Secondary | ICD-10-CM | POA: Diagnosis present

## 2015-03-11 DIAGNOSIS — K922 Gastrointestinal hemorrhage, unspecified: Secondary | ICD-10-CM | POA: Diagnosis not present

## 2015-03-11 DIAGNOSIS — R1084 Generalized abdominal pain: Secondary | ICD-10-CM | POA: Diagnosis not present

## 2015-03-11 DIAGNOSIS — J449 Chronic obstructive pulmonary disease, unspecified: Secondary | ICD-10-CM | POA: Diagnosis not present

## 2015-03-11 DIAGNOSIS — K921 Melena: Secondary | ICD-10-CM | POA: Diagnosis present

## 2015-03-11 DIAGNOSIS — B9620 Unspecified Escherichia coli [E. coli] as the cause of diseases classified elsewhere: Secondary | ICD-10-CM | POA: Diagnosis present

## 2015-03-11 DIAGNOSIS — N183 Chronic kidney disease, stage 3 unspecified: Secondary | ICD-10-CM | POA: Diagnosis present

## 2015-03-11 DIAGNOSIS — Z8673 Personal history of transient ischemic attack (TIA), and cerebral infarction without residual deficits: Secondary | ICD-10-CM | POA: Diagnosis not present

## 2015-03-11 DIAGNOSIS — I130 Hypertensive heart and chronic kidney disease with heart failure and stage 1 through stage 4 chronic kidney disease, or unspecified chronic kidney disease: Secondary | ICD-10-CM | POA: Diagnosis present

## 2015-03-11 DIAGNOSIS — R001 Bradycardia, unspecified: Secondary | ICD-10-CM | POA: Diagnosis not present

## 2015-03-11 DIAGNOSIS — I10 Essential (primary) hypertension: Secondary | ICD-10-CM | POA: Diagnosis not present

## 2015-03-11 DIAGNOSIS — E46 Unspecified protein-calorie malnutrition: Secondary | ICD-10-CM | POA: Diagnosis present

## 2015-03-11 DIAGNOSIS — L89152 Pressure ulcer of sacral region, stage 2: Secondary | ICD-10-CM | POA: Diagnosis present

## 2015-03-11 DIAGNOSIS — G4700 Insomnia, unspecified: Secondary | ICD-10-CM | POA: Diagnosis not present

## 2015-03-11 DIAGNOSIS — I5041 Acute combined systolic (congestive) and diastolic (congestive) heart failure: Secondary | ICD-10-CM | POA: Diagnosis not present

## 2015-03-11 DIAGNOSIS — R112 Nausea with vomiting, unspecified: Secondary | ICD-10-CM | POA: Diagnosis not present

## 2015-03-11 DIAGNOSIS — E876 Hypokalemia: Secondary | ICD-10-CM | POA: Diagnosis present

## 2015-03-11 DIAGNOSIS — Z86010 Personal history of colonic polyps: Secondary | ICD-10-CM | POA: Diagnosis not present

## 2015-03-11 DIAGNOSIS — Z7982 Long term (current) use of aspirin: Secondary | ICD-10-CM

## 2015-03-11 DIAGNOSIS — Q631 Lobulated, fused and horseshoe kidney: Secondary | ICD-10-CM

## 2015-03-11 DIAGNOSIS — Z8672 Personal history of thrombophlebitis: Secondary | ICD-10-CM

## 2015-03-11 DIAGNOSIS — R109 Unspecified abdominal pain: Secondary | ICD-10-CM | POA: Diagnosis not present

## 2015-03-11 DIAGNOSIS — N390 Urinary tract infection, site not specified: Secondary | ICD-10-CM | POA: Diagnosis present

## 2015-03-11 DIAGNOSIS — F0390 Unspecified dementia without behavioral disturbance: Secondary | ICD-10-CM | POA: Diagnosis present

## 2015-03-11 DIAGNOSIS — F411 Generalized anxiety disorder: Secondary | ICD-10-CM | POA: Diagnosis not present

## 2015-03-11 DIAGNOSIS — Z7902 Long term (current) use of antithrombotics/antiplatelets: Secondary | ICD-10-CM

## 2015-03-11 DIAGNOSIS — I110 Hypertensive heart disease with heart failure: Secondary | ICD-10-CM | POA: Diagnosis present

## 2015-03-11 DIAGNOSIS — Z6822 Body mass index (BMI) 22.0-22.9, adult: Secondary | ICD-10-CM

## 2015-03-11 DIAGNOSIS — K264 Chronic or unspecified duodenal ulcer with hemorrhage: Principal | ICD-10-CM | POA: Diagnosis present

## 2015-03-11 DIAGNOSIS — F419 Anxiety disorder, unspecified: Secondary | ICD-10-CM | POA: Diagnosis not present

## 2015-03-11 DIAGNOSIS — K2980 Duodenitis without bleeding: Secondary | ICD-10-CM | POA: Diagnosis not present

## 2015-03-11 DIAGNOSIS — F0391 Unspecified dementia with behavioral disturbance: Secondary | ICD-10-CM | POA: Diagnosis not present

## 2015-03-11 DIAGNOSIS — Z79899 Other long term (current) drug therapy: Secondary | ICD-10-CM

## 2015-03-11 DIAGNOSIS — D649 Anemia, unspecified: Secondary | ICD-10-CM | POA: Diagnosis not present

## 2015-03-11 DIAGNOSIS — K269 Duodenal ulcer, unspecified as acute or chronic, without hemorrhage or perforation: Secondary | ICD-10-CM

## 2015-03-11 DIAGNOSIS — G20 Parkinson's disease: Secondary | ICD-10-CM | POA: Diagnosis present

## 2015-03-11 DIAGNOSIS — E785 Hyperlipidemia, unspecified: Secondary | ICD-10-CM | POA: Diagnosis present

## 2015-03-11 DIAGNOSIS — E119 Type 2 diabetes mellitus without complications: Secondary | ICD-10-CM | POA: Diagnosis not present

## 2015-03-11 DIAGNOSIS — E1142 Type 2 diabetes mellitus with diabetic polyneuropathy: Secondary | ICD-10-CM | POA: Diagnosis present

## 2015-03-11 DIAGNOSIS — F339 Major depressive disorder, recurrent, unspecified: Secondary | ICD-10-CM | POA: Diagnosis not present

## 2015-03-11 DIAGNOSIS — F329 Major depressive disorder, single episode, unspecified: Secondary | ICD-10-CM | POA: Diagnosis present

## 2015-03-11 DIAGNOSIS — R195 Other fecal abnormalities: Secondary | ICD-10-CM | POA: Diagnosis not present

## 2015-03-11 DIAGNOSIS — K219 Gastro-esophageal reflux disease without esophagitis: Secondary | ICD-10-CM | POA: Diagnosis present

## 2015-03-11 DIAGNOSIS — I4891 Unspecified atrial fibrillation: Secondary | ICD-10-CM | POA: Diagnosis not present

## 2015-03-11 DIAGNOSIS — K598 Other specified functional intestinal disorders: Secondary | ICD-10-CM | POA: Diagnosis not present

## 2015-03-11 DIAGNOSIS — D696 Thrombocytopenia, unspecified: Secondary | ICD-10-CM | POA: Diagnosis not present

## 2015-03-11 DIAGNOSIS — M6281 Muscle weakness (generalized): Secondary | ICD-10-CM | POA: Diagnosis not present

## 2015-03-11 DIAGNOSIS — D500 Iron deficiency anemia secondary to blood loss (chronic): Secondary | ICD-10-CM | POA: Diagnosis not present

## 2015-03-11 LAB — PREPARE RBC (CROSSMATCH)

## 2015-03-11 LAB — COMPREHENSIVE METABOLIC PANEL
ALBUMIN: 3.3 g/dL — AB (ref 3.5–5.0)
ALK PHOS: 60 U/L (ref 38–126)
ALT: 7 U/L — ABNORMAL LOW (ref 14–54)
AST: 16 U/L (ref 15–41)
Anion gap: 10 (ref 5–15)
BILIRUBIN TOTAL: 0.3 mg/dL (ref 0.3–1.2)
BUN: 51 mg/dL — AB (ref 6–20)
CO2: 20 mmol/L — AB (ref 22–32)
Calcium: 9 mg/dL (ref 8.9–10.3)
Chloride: 108 mmol/L (ref 101–111)
Creatinine, Ser: 1.76 mg/dL — ABNORMAL HIGH (ref 0.44–1.00)
GFR calc Af Amer: 29 mL/min — ABNORMAL LOW (ref >60)
GFR calc non Af Amer: 25 mL/min — ABNORMAL LOW (ref >60)
GLUCOSE: 180 mg/dL — AB (ref 65–99)
POTASSIUM: 3.6 mmol/L (ref 3.5–5.1)
SODIUM: 138 mmol/L (ref 135–145)
TOTAL PROTEIN: 6.5 g/dL (ref 6.5–8.1)

## 2015-03-11 LAB — CBC WITH DIFFERENTIAL/PLATELET
BASOS ABS: 0 10*3/uL (ref 0.0–0.1)
Basophils Relative: 1 %
EOS PCT: 3 %
Eosinophils Absolute: 0.2 10*3/uL (ref 0.0–0.7)
HEMATOCRIT: 23.4 % — AB (ref 36.0–46.0)
Hemoglobin: 7.9 g/dL — ABNORMAL LOW (ref 12.0–15.0)
LYMPHS ABS: 1.4 10*3/uL (ref 0.7–4.0)
LYMPHS PCT: 24 %
MCH: 34.8 pg — AB (ref 26.0–34.0)
MCHC: 33.8 g/dL (ref 30.0–36.0)
MCV: 103.1 fL — AB (ref 78.0–100.0)
MONO ABS: 0.4 10*3/uL (ref 0.1–1.0)
MONOS PCT: 8 %
Neutro Abs: 3.7 10*3/uL (ref 1.7–7.7)
Neutrophils Relative %: 65 %
PLATELETS: 203 10*3/uL (ref 150–400)
RBC: 2.27 MIL/uL — ABNORMAL LOW (ref 3.87–5.11)
RDW: 14.9 % (ref 11.5–15.5)
WBC: 5.6 10*3/uL (ref 4.0–10.5)

## 2015-03-11 LAB — HEMOGLOBIN AND HEMATOCRIT, BLOOD
HEMATOCRIT: 21.8 % — AB (ref 36.0–46.0)
HEMATOCRIT: 20 % — AB (ref 36.0–46.0)
HEMOGLOBIN: 7.4 g/dL — AB (ref 12.0–15.0)
Hemoglobin: 6.8 g/dL — CL (ref 12.0–15.0)

## 2015-03-11 LAB — POC OCCULT BLOOD, ED: Fecal Occult Bld: POSITIVE — AB

## 2015-03-11 LAB — URINE CULTURE: Culture: >=100000

## 2015-03-11 LAB — PROTIME-INR
INR: 1.4 (ref 0.00–1.49)
Prothrombin Time: 17.2 seconds — ABNORMAL HIGH (ref 11.6–15.2)

## 2015-03-11 LAB — LIPASE, BLOOD: Lipase: 23 U/L (ref 22–51)

## 2015-03-11 LAB — I-STAT CG4 LACTIC ACID, ED: Lactic Acid, Venous: 1.63 mmol/L (ref 0.5–2.0)

## 2015-03-11 MED ORDER — LATANOPROST 0.005 % OP SOLN
1.0000 [drp] | Freq: Every day | OPHTHALMIC | Status: DC
  Administered 2015-03-11 – 2015-03-12 (×2): 1 [drp] via OPHTHALMIC
  Filled 2015-03-11: qty 2.5

## 2015-03-11 MED ORDER — DORZOLAMIDE HCL 2 % OP SOLN
1.0000 [drp] | Freq: Three times a day (TID) | OPHTHALMIC | Status: DC
  Administered 2015-03-11 – 2015-03-15 (×11): 1 [drp] via OPHTHALMIC
  Filled 2015-03-11: qty 10

## 2015-03-11 MED ORDER — FLUTICASONE PROPIONATE 50 MCG/ACT NA SUSP
2.0000 | Freq: Every day | NASAL | Status: DC
  Administered 2015-03-11 – 2015-03-14 (×4): 2 via NASAL
  Filled 2015-03-11: qty 16

## 2015-03-11 MED ORDER — ONDANSETRON HCL 4 MG PO TABS
4.0000 mg | ORAL_TABLET | Freq: Four times a day (QID) | ORAL | Status: DC | PRN
Start: 2015-03-11 — End: 2015-03-15

## 2015-03-11 MED ORDER — TRAZODONE HCL 50 MG PO TABS
25.0000 mg | ORAL_TABLET | Freq: Every evening | ORAL | Status: DC | PRN
  Administered 2015-03-11: 25 mg via ORAL
  Filled 2015-03-11: qty 1

## 2015-03-11 MED ORDER — PANTOPRAZOLE SODIUM 40 MG IV SOLR
40.0000 mg | Freq: Two times a day (BID) | INTRAVENOUS | Status: DC
  Administered 2015-03-12 – 2015-03-15 (×7): 40 mg via INTRAVENOUS
  Filled 2015-03-11 (×8): qty 40

## 2015-03-11 MED ORDER — CIPROFLOXACIN IN D5W 400 MG/200ML IV SOLN: 400.0000 mg | Freq: Two times a day (BID) | INTRAVENOUS | Status: DC

## 2015-03-11 MED ORDER — FENTANYL CITRATE (PF) 100 MCG/2ML IJ SOLN
50.0000 ug | Freq: Once | INTRAMUSCULAR | Status: AC
  Administered 2015-03-11: 50 ug via INTRAVENOUS
  Filled 2015-03-11: qty 2

## 2015-03-11 MED ORDER — CARVEDILOL 3.125 MG PO TABS
3.1250 mg | ORAL_TABLET | Freq: Every day | ORAL | Status: DC
  Administered 2015-03-11 – 2015-03-12 (×2): 3.125 mg via ORAL
  Filled 2015-03-11 (×3): qty 1

## 2015-03-11 MED ORDER — SODIUM CHLORIDE 0.9 % IV SOLN
80.0000 mg | Freq: Once | INTRAVENOUS | Status: AC
  Administered 2015-03-11: 80 mg via INTRAVENOUS
  Filled 2015-03-11: qty 80

## 2015-03-11 MED ORDER — LIDOCAINE 5 % EX PTCH
1.0000 | MEDICATED_PATCH | CUTANEOUS | Status: DC
  Administered 2015-03-11 – 2015-03-15 (×5): 1 via TRANSDERMAL
  Filled 2015-03-11 (×5): qty 1

## 2015-03-11 MED ORDER — SODIUM CHLORIDE 0.9 % IV SOLN
10.0000 mL/h | Freq: Once | INTRAVENOUS | Status: AC
  Administered 2015-03-11: 10 mL/h via INTRAVENOUS

## 2015-03-11 MED ORDER — LORATADINE 10 MG PO TABS
10.0000 mg | ORAL_TABLET | Freq: Every day | ORAL | Status: DC
  Administered 2015-03-11 – 2015-03-13 (×3): 10 mg via ORAL
  Filled 2015-03-11 (×3): qty 1

## 2015-03-11 MED ORDER — PANTOPRAZOLE SODIUM 40 MG IV SOLR: 40.0000 mg | Freq: Two times a day (BID) | INTRAVENOUS | Status: DC

## 2015-03-11 MED ORDER — CIPROFLOXACIN IN D5W 400 MG/200ML IV SOLN
400.0000 mg | INTRAVENOUS | Status: DC
  Administered 2015-03-11 – 2015-03-12 (×2): 400 mg via INTRAVENOUS
  Filled 2015-03-11 (×3): qty 200

## 2015-03-11 MED ORDER — SODIUM CHLORIDE 0.9 % IV SOLN
8.0000 mg/h | INTRAVENOUS | Status: DC
  Administered 2015-03-11 – 2015-03-12 (×2): 8 mg/h via INTRAVENOUS
  Filled 2015-03-11 (×5): qty 80

## 2015-03-11 MED ORDER — ONDANSETRON HCL 4 MG/2ML IJ SOLN
4.0000 mg | Freq: Four times a day (QID) | INTRAMUSCULAR | Status: DC | PRN
  Administered 2015-03-12: 4 mg via INTRAVENOUS

## 2015-03-11 MED ORDER — SODIUM CHLORIDE 0.9 % IV SOLN
Freq: Once | INTRAVENOUS | Status: AC
  Administered 2015-03-11: via INTRAVENOUS

## 2015-03-11 MED ORDER — SODIUM CHLORIDE 0.9 % IV SOLN
INTRAVENOUS | Status: DC
  Administered 2015-03-11 – 2015-03-12 (×2): via INTRAVENOUS

## 2015-03-11 MED ORDER — ACETAMINOPHEN 325 MG PO TABS
650.0000 mg | ORAL_TABLET | Freq: Four times a day (QID) | ORAL | Status: DC | PRN
  Administered 2015-03-11 – 2015-03-15 (×7): 650 mg via ORAL
  Filled 2015-03-11 (×7): qty 2

## 2015-03-11 MED ORDER — ACETAMINOPHEN 650 MG RE SUPP
650.0000 mg | Freq: Four times a day (QID) | RECTAL | Status: DC | PRN
  Administered 2015-03-12: 650 mg via RECTAL
  Filled 2015-03-11: qty 1

## 2015-03-11 MED ORDER — PANTOPRAZOLE SODIUM 40 MG IV SOLR
40.0000 mg | Freq: Once | INTRAVENOUS | Status: AC
  Administered 2015-03-11: 40 mg via INTRAVENOUS
  Filled 2015-03-11: qty 40

## 2015-03-11 NOTE — ED Notes (Signed)
Per EMS: Pt from [facility]. Staff states that pt has been having dark loose stools since this morning.  Pt has no complaints.  No pain.

## 2015-03-11 NOTE — ED Notes (Signed)
Patient transported to X-ray 

## 2015-03-11 NOTE — ED Provider Notes (Signed)
CSN: [id number]     Arrival date & time 03/11/15  1023 History   First MD Initiated Contact with Patient 03/11/15 1029     Chief Complaint  Patient presents with  . Rectal Bleeding     (Consider location/radiation/quality/duration/timing/severity/associated sxs/prior Treatment) HPI  81 year old female presents with dark stool from her nursing facility. Recently seen in this ER and discharged with a UTI after a negative CT scan. Patient has not complained of abdominal pain but the daughter at the bedside thinks that her abdomen is more bloated than normal. She has a history of a volvulus twice. Typically she is in more pain than she is in now. The patient is on blood thinners for her atrial fibrillation and one time this morning had dark stools at the facility stated looked like blood. The patient has not had any vomiting. States her abdomen does not hurt unless it is pushed on.  Past Medical History  Diagnosis Date  . Atrial fibrillation ([place])   . Altered mental status   . Encephalopathy   . Parkinson disease ([place])   . Hypertension   . GERD (gastroesophageal reflux disease)   . Hyperlipemia   . Diabetes mellitus   . Coronary artery disease   . History of adenomatous polyp of colon 06/24/99  . Enlarged heart   . DEMENTIA   . Edema of lower extremity 07/13/11    right leg more swollen than left leg  . Esophageal dysmotility 07/02/12  . Horseshoe kidney   . Pancreatic lesion 05/22/11    no further workup  per PCP/family due to age  . Anemia   . Peripheral neuropathy ([place])   . Depression   . Thrombophlebitis   . TIA (transient ischemic attack) 12/19/2012  . Sigmoid volvulus ([place]) 05/24/2013   Past Surgical History  Procedure Laterality Date  . Shoulder surgery  2001    left clavicle excision and acromioplasty  . Abdominal hysterectomy    . Cholecystectomy    . Total hip arthroplasty    . Breast surgery      2 benign tumors removed left breast  . Foot surgery      benign  tumors from foot  . Esophageal dilation      several times by Dr. [name]  . Nose surgery    . Esophagogastroduodenoscopy (egd) with esophageal dilation N/A 08/01/2012    Procedure: ESOPHAGOGASTRODUODENOSCOPY (EGD) WITH ESOPHAGEAL DILATION;  Surgeon: [name], MD;  Location: [facility];  Service: Endoscopy;  Laterality: N/A;  with c-arm savory dilators  . Flexible sigmoidoscopy N/A 05/24/2013    Procedure: FLEXIBLE SIGMOIDOSCOPY;  Surgeon: [name], MD;  Location: [facility];  Service: Endoscopy;  Laterality: N/A;  . Flexible sigmoidoscopy N/A 05/26/2013    Procedure:  flex with decompression of sigmoid volvulus;  Surgeon: [name], MD;  Location: [facility];  Service: Endoscopy;  Laterality: N/A;   Family History  Problem Relation Age of Onset  . Cancer    . Heart disease     Social History  Substance Use Topics  . Smoking status: Former Smoker    Quit date: 10/09/1964  . Smokeless tobacco: Never Used  . Alcohol Use: No   OB History    No data available     Review of Systems  Gastrointestinal: Positive for blood in stool and abdominal distention. Negative for vomiting and abdominal pain.  All other systems reviewed and are negative.     Allergies  Aspirin  Home Medications  Prior to Admission medications   Medication Sig Start Date End Date Taking? Authorizing Provider  acetaminophen (TYLENOL) 325 MG tablet Take 2 tablets (650 mg total) by mouth every 6 (six) hours as needed for mild pain. Patient taking differently: Take 650 mg by mouth 2 (two) times daily. For neck pain. 11/03/14   Gerlene Fee, NP  amLODipine (NORVASC) 2.5 MG tablet Take 1 tablet (2.5 mg total) by mouth daily. 01/06/14   Barton Dubois, MD  aspirin 81 MG chewable tablet Chew 81 mg by mouth daily.    Historical Provider, MD  baclofen (LIORESAL) 10 MG tablet Take 5 mg by mouth at bedtime.    Historical Provider, MD  bisacodyl (DULCOLAX) 10 MG suppository Place 10 mg rectally  as needed for mild constipation or moderate constipation.    Historical Provider, MD  carvedilol (COREG) 3.125 MG tablet Take 3.125 mg by mouth daily.    Historical Provider, MD  cholecalciferol (VITAMIN D) 1000 UNITS tablet Take 1,000 Units by mouth daily.    Historical Provider, MD  ciprofloxacin (CIPRO) 500 MG tablet Take 1 tablet (500 mg total) by mouth 2 (two) times daily. 03/09/15   Jola Schmidt, MD  clopidogrel (PLAVIX) 75 MG tablet Take 75 mg by mouth daily.     Historical Provider, MD  Cranberry 475 MG CAPS Take 1 capsule by mouth 2 (two) times daily.    Historical Provider, MD  diclofenac sodium (VOLTAREN) 1 % GEL Apply 2 g topically 2 (two) times daily.     Historical Provider, MD  docusate sodium (COLACE) 100 MG capsule Take 200 mg by mouth daily.    Historical Provider, MD  dorzolamide (TRUSOPT) 2 % ophthalmic solution Place 1 drop into both eyes 3 (three) times daily.     Historical Provider, MD  escitalopram (LEXAPRO) 10 MG tablet Take 1 tablet (10 mg total) by mouth daily. 12/03/14   Gerlene Fee, NP  ferrous sulfate 325 (65 FE) MG tablet Take 325 mg by mouth daily with breakfast.    Historical Provider, MD  fluticasone (FLONASE) 50 MCG/ACT nasal spray Place 2 sprays into both nostrils at bedtime.    Historical Provider, MD  furosemide (LASIX) 40 MG tablet Take 1 tablet (40 mg total) by mouth daily. 02/28/15   Robbie Lis, MD  HYDROcodone-acetaminophen (NORCO/VICODIN) 5-325 MG tablet Take 1 tablet by mouth every 6 (six) hours as needed for moderate pain. 03/09/15   Jola Schmidt, MD  lidocaine (LIDODERM) 5 % Place 1 patch onto the skin daily. Apply 1 patch to left lower back/hip every morning.  Remove & Discard patch within 12 hours or as directed by MD 07/28/13   Thurnell Lose, MD  Linaclotide [facility]) 145 MCG CAPS capsule Take 145 mcg by mouth daily.    Historical Provider, MD  loratadine (CLARITIN) 10 MG tablet Take 10 mg by mouth daily.    Historical Provider, MD  Multiple  Vitamins-Minerals (CERTAGEN PO) Take 1 tablet by mouth daily.     Historical Provider, MD  NON FORMULARY Take 90 mLs by mouth 2 (two) times daily. 2 cal supplement    Historical Provider, MD  ondansetron (ZOFRAN ODT) 8 MG disintegrating tablet Take 1 tablet (8 mg total) by mouth every 8 (eight) hours as needed for nausea or vomiting. 03/09/15   Jola Schmidt, MD  ondansetron (ZOFRAN) 4 MG tablet Take 1 tablet (4 mg total) by mouth every 6 (six) hours as needed for nausea. 02/28/15   Robbie Lis, MD  Polyethyl Glycol-Propyl Glycol (SYSTANE) 0.4-0.3 % SOLN Place 1 drop into both eyes 4 (four) times daily.     Historical Provider, MD  potassium chloride SA (K-DUR,KLOR-CON) 20 MEQ tablet Take 20 mEq by mouth daily. 10/23/13   Janece Canterbury, MD  senna (SENOKOT) 8.6 MG TABS tablet Take 1 tablet (8.6 mg total) by mouth 2 (two) times daily. 01/05/14   Barton Dubois, MD  sodium chloride (OCEAN) 0.65 % SOLN nasal spray Place 1 spray into both nostrils 4 (four) times daily. And every 4 hours as needed to moisturize nasal passages.    Historical Provider, MD  Travoprost, BAK Free, (TRAVATAN) 0.004 % SOLN ophthalmic solution Place 1 drop into both eyes at bedtime.     Historical Provider, MD  traZODone (DESYREL) 50 MG tablet Take 0.5 tablets (25 mg total) by mouth at bedtime as needed for sleep. Patient taking differently: Take 25 mg by mouth at bedtime.  11/03/14   Gerlene Fee, NP  vitamin B-12 (CYANOCOBALAMIN) 500 MCG tablet Take 500 mcg by mouth daily.    Historical Provider, MD  vitamin C (ASCORBIC ACID) 500 MG tablet Take 500 mg by mouth daily.    Historical Provider, MD   BP 124/61 mmHg  Pulse 86  Temp(Src) 98.9 F (37.2 C)  Resp 20  SpO2 99% Physical Exam  Constitutional: She is oriented to person, place, and time. She appears well-developed and well-nourished.  HENT:  Head: Normocephalic and atraumatic.  Right Ear: External ear normal.  Left Ear: External ear normal.  Nose: Nose normal.  Eyes:  Right eye exhibits no discharge. Left eye exhibits no discharge.  Cardiovascular: Normal rate, regular rhythm and normal heart sounds.   Pulmonary/Chest: Effort normal and breath sounds normal.  Abdominal: Soft. She exhibits no distension. There is tenderness (mild diffuse tenderness). There is no rigidity and no guarding.  Genitourinary: Guaiac positive stool (dark stool c/w blood).  Neurological: She is alert and oriented to person, place, and time.  Skin: Skin is warm and dry.  Nursing note and vitals reviewed.   ED Course  Procedures (including critical care time) Labs Review Labs Reviewed  COMPREHENSIVE METABOLIC PANEL - Abnormal; Notable for the following:    CO2 20 (*)    Glucose, Bld 180 (*)    BUN 51 (*)    Creatinine, Ser 1.76 (*)    Albumin 3.3 (*)    ALT 7 (*)    GFR calc non Af Amer 25 (*)    GFR calc Af Amer 29 (*)    All other components within normal limits  CBC WITH DIFFERENTIAL/PLATELET - Abnormal; Notable for the following:    RBC 2.27 (*)    Hemoglobin 7.9 (*)    HCT 23.4 (*)    MCV 103.1 (*)    MCH 34.8 (*)    All other components within normal limits  PROTIME-INR - Abnormal; Notable for the following:    Prothrombin Time 17.2 (*)    All other components within normal limits  HEMOGLOBIN AND HEMATOCRIT, BLOOD - Abnormal; Notable for the following:    Hemoglobin 7.4 (*)    HCT 21.8 (*)    All other components within normal limits  POC OCCULT BLOOD, ED - Abnormal; Notable for the following:    Fecal Occult Bld POSITIVE (*)    All other components within normal limits  LIPASE, BLOOD  HEMOGLOBIN AND HEMATOCRIT, BLOOD  I-STAT CG4 LACTIC ACID, ED  PREPARE RBC (CROSSMATCH)  TYPE AND SCREEN  Imaging Review Dg Abd 1 View  03/11/2015   CLINICAL DATA:  81 year old female with abdominal pain nausea vomiting, abdominal distention. Initial encounter.  EXAM: ABDOMEN - 1 VIEW  COMPARISON:  CT Abdomen and Pelvis 03/09/2015, and earlier  FINDINGS: Supine view.  Continued Stable gas pattern compared to the recent CT and radiographs with gaseous distension of redundant sigmoid colon. No upstream dilated bowel loops. No definite pneumoperitoneum. Stable cholecystectomy clips. Stable visualized osseous structures. Calcified aortic and iliac artery atherosclerosis.  IMPRESSION: Stable bowel gas pattern over this series of exams with continued gaseous distension of the redundant sigmoid colon. Favor chronic decreased colonic motility.   Electronically Signed   By: Genevie Ann M.D.   On: 03/11/2015 11:44   I have personally reviewed and evaluated these images and lab results as part of my medical decision-making.   EKG Interpretation None      MDM   Final diagnoses:  Gastrointestinal hemorrhage with melena    Patient with an acute GI bleed. Given elevated BUN there is concern for an upper GI bleed. We'll give IV protonix. Has obvious melanoma on rectal exam. Hemoglobin is currently about 7 but significant drop from just a couple days ago. Plan to type and cross and admit to the hospitalist for further workup and treatment. She is hemodynamically stable at this time.    Sherwood Gambler, MD 03/11/15 [phone]

## 2015-03-11 NOTE — Progress Notes (Signed)
New Admission Note:   Arrival Method: Stretcher  Mental Orientation: Alert and Oriented Telemetry: [address] Assessment: Completed Skin: stage II sacral pressure ulcer IV: NS @ 50, clean, dry, and intact Pain: Patient denies pain at this time Tubes: None Safety Measures: Safety Fall Prevention Plan has been given, discussed and signed Admission: Completed [facility] Orientation: Patient has been orientated to the room, unit and staff.  Family: Present at bedside  Orders have been reviewed and implemented. Will continue to monitor the patient. Call light has been placed within reach and bed alarm has been activated.   Nonie Hoyer BSN, RN Phone number: [phone]

## 2015-03-11 NOTE — Anesthesia Preprocedure Evaluation (Addendum)
Anesthesia Evaluation  Patient identified by MRN, date of birth, ID band Patient awake    Reviewed: Allergy & Precautions, NPO status , Patient's Chart, lab work & pertinent test results  History of Anesthesia Complications Negative for: history of anesthetic complications  Airway Mallampati: II  TM Distance: >3 FB Neck ROM: Full    Dental no notable dental hx. (+) Edentulous Upper, Edentulous Lower   Pulmonary neg pulmonary ROS, former smoker,    Pulmonary exam normal breath sounds clear to auscultation       Cardiovascular hypertension, Pt. on medications + CAD and +CHF  Normal cardiovascular exam Rhythm:Regular Rate:Normal  Last echo reviewed 2015, EF 60%   Neuro/Psych  Headaches, PSYCHIATRIC DISORDERS Anxiety Depression parkinsons TIA   GI/Hepatic Neg liver ROS, GERD  Medicated and Controlled,  Endo/Other  diabetes  Renal/GU Renal disease  negative genitourinary   Musculoskeletal negative musculoskeletal ROS (+)   Abdominal   Peds negative pediatric ROS (+)  Hematology negative hematology ROS (+)   Anesthesia Other Findings   Reproductive/Obstetrics negative OB ROS                            Anesthesia Physical Anesthesia Plan  ASA: III  Anesthesia Plan: MAC   Post-op Pain Management:    Induction: Intravenous  Airway Management Planned: Nasal Cannula  Additional Equipment:   Intra-op Plan:   Post-operative Plan:   Informed Consent: I have reviewed the patients History and Physical, chart, labs and discussed the procedure including the risks, benefits and alternatives for the proposed anesthesia with the patient or authorized representative who has indicated his/her understanding and acceptance.   Dental advisory given  Plan Discussed with:   Anesthesia Plan Comments:        Anesthesia Quick Evaluation

## 2015-03-11 NOTE — ED Notes (Addendum)
Dr. Darrick Meigs at bedside for examination. Blood consent signed by patient's daughter.

## 2015-03-11 NOTE — Consult Note (Addendum)
Referring Provider: [facility] Primary Care Physician:  No primary care provider on file. Primary Gastroenterologist:  Dr. Olevia Perches  Reason for Consultation:   GI bleed     HPI: Maria Christian is a 81 y.o. female Dr. Olevia Perches after having been evaluated in the past for cervical and pharyngeal dysphagia. She has a history of dementia, atrial fibrillation on Plavix and baby aspirin, Parkinson disease, hypertension, GERD, hyperlipidemia, diabetes, coronary artery disease, colon polyps, edema of right lower extremity, esophageal dysmotility, depression, peripheral neuropathy, TIA, and sigmoid volvulus. She also has a history of a pancreatic lesion and chart records state "no further workup per PCP/family due to age".  She was brought to the hospital from the nursing home today. Patient has been noted to have dark-colored stools for several days. The family reports she was seen in the emergency room recently diagnosed with a UTI and has been on ciprofloxacin. Patient reports she is in having abdominal discomfort, nausea, and vomiting. She has been feeling weak and tired. She denies change in her bowel habits. Blood work in the emergency room found to have a hemoglobin of 7.9 which is down from 10.2 on 03/09/2015.  Past Medical History  Diagnosis Date  . Atrial fibrillation ([place])   . Altered mental status   . Encephalopathy   . Parkinson disease ([place])   . Hypertension   . GERD (gastroesophageal reflux disease)   . Hyperlipemia   . Diabetes mellitus   . Coronary artery disease   . History of adenomatous polyp of colon 06/24/99  . Enlarged heart   . DEMENTIA   . Edema of lower extremity 07/13/11    right leg more swollen than left leg  . Esophageal dysmotility 07/02/12  . Horseshoe kidney   . Pancreatic lesion 05/22/11    no further workup  per PCP/family due to age  . Anemia   . Peripheral neuropathy ([place])   . Depression   . Thrombophlebitis   . TIA (transient ischemic attack)  12/19/2012  . Sigmoid volvulus ([place]) 05/24/2013    Past Surgical History  Procedure Laterality Date  . Shoulder surgery  2001    left clavicle excision and acromioplasty  . Abdominal hysterectomy    . Cholecystectomy    . Total hip arthroplasty    . Breast surgery      2 benign tumors removed left breast  . Foot surgery      benign tumors from foot  . Esophageal dilation      several times by Dr. [name]  . Nose surgery    . Esophagogastroduodenoscopy (egd) with esophageal dilation N/A 08/01/2012    Procedure: ESOPHAGOGASTRODUODENOSCOPY (EGD) WITH ESOPHAGEAL DILATION;  Surgeon: [name], MD;  Location: [facility];  Service: Endoscopy;  Laterality: N/A;  with c-arm savory dilators  . Flexible sigmoidoscopy N/A 05/24/2013    Procedure: FLEXIBLE SIGMOIDOSCOPY;  Surgeon: [name], MD;  Location: [facility];  Service: Endoscopy;  Laterality: N/A;  . Flexible sigmoidoscopy N/A 05/26/2013    Procedure:  flex with decompression of sigmoid volvulus;  Surgeon: [name], MD;  Location: [facility];  Service: Endoscopy;  Laterality: N/A;    Prior to Admission medications   Medication Sig Start Date End Date Taking? Authorizing Provider  acetaminophen (TYLENOL) 325 MG tablet Take 2 tablets (650 mg total) by mouth every 6 (six) hours as needed for mild pain. Patient taking differently: Take 650 mg by mouth 2 (two) times daily. For neck pain. 11/03/14  Yes Gerlene Fee, NP  amLODipine (NORVASC) 2.5 MG tablet Take 1 tablet (2.5 mg total) by mouth daily. Patient taking differently: Take 2.5 mg by mouth daily. Hold for BP<100/60 or HR<60 01/06/14  Yes Barton Dubois, MD  aspirin 81 MG chewable tablet Chew 81 mg by mouth daily.   Yes Historical Provider, MD  baclofen (LIORESAL) 10 MG tablet Take 5 mg by mouth at bedtime.   Yes Historical Provider, MD  bisacodyl (DULCOLAX) 10 MG suppository Place 10 mg rectally as needed for mild constipation or moderate constipation.   Yes  Historical Provider, MD  carvedilol (COREG) 3.125 MG tablet Take 3.125 mg by mouth daily.   Yes Historical Provider, MD  cholecalciferol (VITAMIN D) 1000 UNITS tablet Take 1,000 Units by mouth daily.   Yes Historical Provider, MD  ciprofloxacin (CIPRO) 500 MG tablet Take 1 tablet (500 mg total) by mouth 2 (two) times daily. Patient taking differently: Take 500 mg by mouth 2 (two) times daily. For 7 days 10-4 to 10-11 03/09/15  Yes Jola Schmidt, MD  clopidogrel (PLAVIX) 75 MG tablet Take 75 mg by mouth daily.    Yes Historical Provider, MD  Cranberry 475 MG CAPS Take 1 capsule by mouth 2 (two) times daily.   Yes Historical Provider, MD  diclofenac sodium (VOLTAREN) 1 % GEL Apply 2 g topically 2 (two) times daily.    Yes Historical Provider, MD  docusate sodium (COLACE) 100 MG capsule Take 200 mg by mouth daily.   Yes Historical Provider, MD  dorzolamide (TRUSOPT) 2 % ophthalmic solution Place 1 drop into both eyes 3 (three) times daily.    Yes Historical Provider, MD  escitalopram (LEXAPRO) 10 MG tablet Take 1 tablet (10 mg total) by mouth daily. 12/03/14  Yes Gerlene Fee, NP  ferrous sulfate 325 (65 FE) MG tablet Take 325 mg by mouth daily with breakfast.   Yes Historical Provider, MD  fluticasone (FLONASE) 50 MCG/ACT nasal spray Place 2 sprays into both nostrils at bedtime.   Yes Historical Provider, MD  furosemide (LASIX) 40 MG tablet Take 1 tablet (40 mg total) by mouth daily. 02/28/15  Yes Robbie Lis, MD  HYDROcodone-acetaminophen (NORCO/VICODIN) 5-325 MG tablet Take 1 tablet by mouth every 6 (six) hours as needed for moderate pain. 03/09/15  Yes Jola Schmidt, MD  lidocaine (LIDODERM) 5 % Place 1 patch onto the skin daily. Apply 1 patch to left lower back/hip every morning.  Remove & Discard patch within 12 hours or as directed by MD 07/28/13  Yes Thurnell Lose, MD  Linaclotide [facility]) 145 MCG CAPS capsule Take 145 mcg by mouth daily.   Yes Historical Provider, MD  loratadine (CLARITIN)  10 MG tablet Take 10 mg by mouth daily.   Yes Historical Provider, MD  Multiple Vitamins-Minerals (CERTAGEN PO) Take 1 tablet by mouth daily.    Yes Historical Provider, MD  ondansetron (ZOFRAN) 8 MG tablet Take 8 mg by mouth every 8 (eight) hours as needed for nausea or vomiting.   Yes Historical Provider, MD  Polyethyl Glycol-Propyl Glycol (SYSTANE) 0.4-0.3 % SOLN Place 1 drop into both eyes 4 (four) times daily.    Yes Historical Provider, MD  potassium chloride SA (K-DUR,KLOR-CON) 20 MEQ tablet Take 20 mEq by mouth daily. 10/23/13  Yes Janece Canterbury, MD  senna (SENOKOT) 8.6 MG TABS tablet Take 1 tablet (8.6 mg total) by mouth 2 (two) times daily. 01/05/14  Yes Barton Dubois, MD  sodium chloride (OCEAN) 0.65 % SOLN nasal  spray Place 1 spray into both nostrils as directed. Four times daily And every 24 hours as needed to moisturize nasal passages.   Yes Historical Provider, MD  Travoprost, BAK Free, (TRAVATAN) 0.004 % SOLN ophthalmic solution Place 1 drop into both eyes at bedtime.    Yes Historical Provider, MD  traZODone (DESYREL) 50 MG tablet Take 0.5 tablets (25 mg total) by mouth at bedtime as needed for sleep. Patient taking differently: Take 25 mg by mouth at bedtime.  11/03/14  Yes Gerlene Fee, NP  vitamin B-12 (CYANOCOBALAMIN) 500 MCG tablet Take 500 mcg by mouth daily.   Yes Historical Provider, MD  vitamin C (ASCORBIC ACID) 500 MG tablet Take 500 mg by mouth daily.   Yes Historical Provider, MD    Current Facility-Administered Medications  Medication Dose Route Frequency Provider Last Rate Last Dose  . 0.9 %  sodium chloride infusion   Intravenous Continuous Oswald Hillock, MD 50 mL/hr at 03/11/15 1434    . acetaminophen (TYLENOL) tablet 650 mg  650 mg Oral Q6H PRN Oswald Hillock, MD       Or  . acetaminophen (TYLENOL) suppository 650 mg  650 mg Rectal Q6H PRN Oswald Hillock, MD      . carvedilol (COREG) tablet 3.125 mg  3.125 mg Oral Daily Oswald Hillock, MD      . ciprofloxacin (CIPRO)  IVPB 400 mg  400 mg Intravenous Q24H Oswald Hillock, MD   400 mg at 03/11/15 1433  . dorzolamide (TRUSOPT) 2 % ophthalmic solution 1 drop  1 drop Both Eyes TID Oswald Hillock, MD      . fluticasone (FLONASE) 50 MCG/ACT nasal spray 2 spray  2 spray Each Nare QHS Oswald Hillock, MD      . latanoprost (XALATAN) 0.005 % ophthalmic solution 1 drop  1 drop Both Eyes QHS Oswald Hillock, MD      . lidocaine (LIDODERM) 5 % 1 patch  1 patch Transdermal Q24H Oswald Hillock, MD      . loratadine (CLARITIN) tablet 10 mg  10 mg Oral Daily Oswald Hillock, MD      . ondansetron (ZOFRAN) tablet 4 mg  4 mg Oral Q6H PRN Oswald Hillock, MD       Or  . ondansetron (ZOFRAN) injection 4 mg  4 mg Intravenous Q6H PRN Oswald Hillock, MD      . pantoprazole (PROTONIX) 80 mg in sodium chloride 0.9 % 250 mL (0.32 mg/mL) infusion  8 mg/hr Intravenous Continuous Oswald Hillock, MD 25 mL/hr at 03/11/15 1433 8 mg/hr at 03/11/15 1433  . [START ON 03/12/2015] pantoprazole (PROTONIX) injection 40 mg  40 mg Intravenous Q12H Oswald Hillock, MD      . traZODone (DESYREL) tablet 25 mg  25 mg Oral QHS PRN Oswald Hillock, MD        Allergies as of 03/11/2015 - Review Complete 03/11/2015  Allergen Reaction Noted  . Aspirin Other (See Comments) 05/26/2013    Family History  Problem Relation Age of Onset  . Cancer    . Heart disease      Social History   Social History  . Marital Status: Widowed    Spouse Name: N/A  . Number of Children: 5  . Years of Education: 10th   Occupational History  . Retired    Social History Main Topics  . Smoking status: Former Smoker    Quit date: 10/09/1964  . Smokeless  tobacco: Never Used  . Alcohol Use: No  . Drug Use: No  . Sexual Activity: No   Other Topics Concern  . Not on file   Social History Narrative   Pt lives at [facility] and [place].   Caffeine Use: very small amount daily    Review of Systems: Gen: Reports a sore throat and runny nose for several days along with headache CV:  Denies chest pain, angina, palpitations, syncope, orthopnea, PND, peripheral edema, and claudication. Resp: Denies dyspnea at rest, dyspnea with exercise, cough, sputum, wheezing, coughing up blood, and pleurisy. GI: Complains of epigastric pain, nausea and vomiting GU : Currently being treated for UTI MS: Complains of long-standing pain in both legs Derm: Denies rash, itching, dry skin, hives, moles, warts, or unhealing ulcers.  Psych: History of dementia Heme: Denies bruising, bleeding, and enlarged lymph nodes. Neuro:  Denies any  dizziness, paresthesias.  Physical Exam: Vital signs in last 24 hours: Temp:  [98.9 F (37.2 C)-99 F (37.2 C)] 99 F (37.2 C) (10/06 1306) Pulse Rate:  [83-86] 83 (10/06 1306) Resp:  [19-20] 19 (10/06 1306) BP: (120-124)/(53-61) 120/53 mmHg (10/06 1306) SpO2:  [99 %-100 %] 100 % (10/06 1306) Weight:  [143 lb 4.8 oz (65 kg)] 143 lb 4.8 oz (65 kg) (10/06 1306) Last BM Date: 03/11/15 General:   Alert, thin, chronically ill-appearing elderly female in no apparent distress Head:  Normocephalic and atraumatic. Temporal wasting noted Eyes:  Sclera clear, no icterus.   Conjunctiva pale Ears:  Normal auditory acuity. Nose:  No deformity, discharge,  or lesions. Mouth:  No deformity or lesions.   Neck:  Supple; no masses or thyromegaly. Lungs:  Clear throughout to auscultation.     Heart:  Regular rate and rhythm Abdomen:  Soft,nontender, BS active,nonpalp mass or hsm.   Rectal:  Deferred , POC stool positive for blood Msk:  Symmetrical without gross deformities. . Pulses:  Normal pulses noted. Extremities: RLE edema noted Neurologic:AxOx3 Skin sacral area with a decubitus ulcer Psych: Alert and cooperative. Normal mood and affect.  Intake/Output from previous day:   Intake/Output this shift:    Lab Results:  Recent Labs  03/09/15 1248 03/11/15 1100 03/11/15 1313  WBC 5.8 5.6  --   HGB 10.2* 7.9* 7.4*  HCT 30.3* 23.4* 21.8*  PLT 221 203  --     BMET  Recent Labs  03/09/15 1248 03/11/15 1100  NA 139 138  K 3.2* 3.6  CL 111 108  CO2 22 20*  GLUCOSE 137* 180*  BUN 14 51*  CREATININE 1.07* 1.76*  CALCIUM 9.2 9.0   LFT  Recent Labs  03/11/15 1100  PROT 6.5  ALBUMIN 3.3*  AST 16  ALT 7*  ALKPHOS 60  BILITOT 0.3   PT/INR  Recent Labs  03/11/15 1101  LABPROT 17.2*  INR 1.40    Studies/Results: Dg Abd 1 View  03/11/2015   CLINICAL DATA:  81 year old female with abdominal pain nausea vomiting, abdominal distention. Initial encounter.  EXAM: ABDOMEN - 1 VIEW  COMPARISON:  CT Abdomen and Pelvis 03/09/2015, and earlier  FINDINGS: Supine view. Continued Stable gas pattern compared to the recent CT and radiographs with gaseous distension of redundant sigmoid colon. No upstream dilated bowel loops. No definite pneumoperitoneum. Stable cholecystectomy clips. Stable visualized osseous structures. Calcified aortic and iliac artery atherosclerosis.  IMPRESSION: Stable bowel gas pattern over this series of exams with continued gaseous distension of the redundant sigmoid colon. Favor chronic decreased colonic motility.  Electronically Signed   By: Genevie Ann M.D.   On: 03/11/2015 11:44   Status: Final result       PACS Images     Show images for CT Abdomen Pelvis W Contrast     Study Result     CLINICAL DATA: Mid abdomen pain, constipation since yesterday.  EXAM: CT ABDOMEN AND PELVIS WITH CONTRAST  TECHNIQUE: Multidetector CT imaging of the abdomen and pelvis was performed using the standard protocol following bolus administration of intravenous contrast.  CONTRAST: 124mL OMNIPAQUE IOHEXOL 300 MG/ML SOLN  COMPARISON: February 26, 2015, Oct 09, 2013, July 24, 2013  FINDINGS: The liver, spleen, adrenal glands are normal. Horseshoe kidney is identified unchanged. Patient is status post prior cholecystectomy. There is a low-density mass in the body of pancreas measuring 2.4 x 1.5 cm unchanged  compared to prior CT of July 24, 2013. Atherosclerosis of the abdominal aorta is identified without aneurysmal dilatation. There is no abdominal lymphadenopathy.  There is no small bowel obstruction or diverticulitis. The appendix is not seen but no inflammation is noted around the cecum. Mild bowel content is identified in the colon.  Fluid-filled bladder is normal. Pelvic phleboliths are identified. Left hip replacement is noted. There is mild atelectasis or scar of the left lung base. There is no focal pneumonia or pleural effusion. The heart size is enlarged. There is a small pericardial effusion. Images of the bones demonstrate focal sclerosis of the superior endplate of L4 unchanged since February 2015. There is lucency in the L2 vertebral body unchanged since February 2015.  IMPRESSION: There is no small bowel obstruction or diverticulitis. Mild bowel content is identified in the colon.  Low-density mass in the body of pancreas measuring 2.4 x 1.5 cm unchanged compared to prior CT of July 24, 2013. If clinically indicated, consider further evaluation with MRI of pancreas.   Electronically Signed  By: Abelardo Diesel M.D.  On: 03/09/2015 14:57    IMPRESSION/PLAN: 81 year old female with a history of atrial fibrillation on Plavix and aspirin, admitted with melena and drop in hemoglobin. BUN also noted to be elevated. GI bleed likely an upper source. Would start on Protonix. Nothing by mouth. Hold anticoagulation. We will plan on diagnostic EGD tomorrow. Will also review with attending regarding any further evaluation.  Sacral ulcer. Wound care to see patient Chronic diastolic CHF UTI, cultures with Escherichia coli sensitive to Cipro. Patient on IV Cipro. Atrial fibrillation. Has been on aspirin and Plavix. We'll hold at this time due to GI bleed. Hypertension  Hvozdovic, Deloris Ping 03/11/2015,  Pager [phone]  GI Attending Note   Chart was reviewed and  patient was examined. X-rays and lab were reviewed.    I agree with management and plans.  She likely has had an acute upper GI bleed in the setting of antiplatelets therapy.  Suspect active ulcer disease versus AVMs. Pancreatic lesion incidental.  Would not pursue workup in view of overall medical status.  Sandy Salaam. Deatra Ina, M.D., [facility] Cell [phone]   03/12/15

## 2015-03-11 NOTE — H&P (Addendum)
PCP:   No primary care provider on file.   Chief Complaint:  Dark stool  HPI: 81 year old female who   has a past medical history of Atrial fibrillation ([place]); Altered mental status; Encephalopathy; Parkinson disease ([place]); Hypertension; GERD (gastroesophageal reflux disease); Hyperlipemia; Diabetes mellitus; Coronary artery disease; History of adenomatous polyp of colon (06/24/99); Enlarged heart; DEMENTIA; Edema of lower extremity (07/13/11); Esophageal dysmotility (07/02/12); Horseshoe kidney; Pancreatic lesion (05/22/11); Anemia; Peripheral neuropathy ([place]); Depression; Thrombophlebitis; TIA (transient ischemic attack) (12/19/2012); and Sigmoid volvulus ([place]) (05/24/2013). Today was brought to the hospital from skilled facility after she was noted to have dark-colored stool. Patient was recently seen in the ED and was diagnosed with UTI and was discharged on ciprofloxacin. Patient says that she has been having abdominal discomfort, nausea and intermittent vomiting. This morning she was noted to have dark colored stool which looks like blood. She denies chest pain, no shortness of breath. Denies nausea and vomiting today. In the ED she was found to have hemoglobin of 7.9 which is down from 10.2 on 03/09/2015. The urine culture done on 03/09/2015 the Escherichia coli sensitive to ciprofloxacin. Patient has been taking aspirin and Plavix for CAD. No history of coronary stents.  Allergies:   Allergies  Allergen Reactions  . Aspirin Other (See Comments)    [name] only      Past Medical History  Diagnosis Date  . Atrial fibrillation ([place])   . Altered mental status   . Encephalopathy   . Parkinson disease ([place])   . Hypertension   . GERD (gastroesophageal reflux disease)   . Hyperlipemia   . Diabetes mellitus   . Coronary artery disease   . History of adenomatous polyp of colon 06/24/99  . Enlarged heart   . DEMENTIA   . Edema of lower extremity 07/13/11    right leg more swollen  than left leg  . Esophageal dysmotility 07/02/12  . Horseshoe kidney   . Pancreatic lesion 05/22/11    no further workup  per PCP/family due to age  . Anemia   . Peripheral neuropathy ([place])   . Depression   . Thrombophlebitis   . TIA (transient ischemic attack) 12/19/2012  . Sigmoid volvulus ([place]) 05/24/2013    Past Surgical History  Procedure Laterality Date  . Shoulder surgery  2001    left clavicle excision and acromioplasty  . Abdominal hysterectomy    . Cholecystectomy    . Total hip arthroplasty    . Breast surgery      2 benign tumors removed left breast  . Foot surgery      benign tumors from foot  . Esophageal dilation      several times by Dr. [name]  . Nose surgery    . Esophagogastroduodenoscopy (egd) with esophageal dilation N/A 08/01/2012    Procedure: ESOPHAGOGASTRODUODENOSCOPY (EGD) WITH ESOPHAGEAL DILATION;  Surgeon: [name], MD;  Location: [facility];  Service: Endoscopy;  Laterality: N/A;  with c-arm savory dilators  . Flexible sigmoidoscopy N/A 05/24/2013    Procedure: FLEXIBLE SIGMOIDOSCOPY;  Surgeon: [name], MD;  Location: [facility];  Service: Endoscopy;  Laterality: N/A;  . Flexible sigmoidoscopy N/A 05/26/2013    Procedure:  flex with decompression of sigmoid volvulus;  Surgeon: [name], MD;  Location: [facility];  Service: Endoscopy;  Laterality: N/A;    Prior to Admission medications   Medication Sig Start Date End Date Taking? Authorizing Provider  acetaminophen (TYLENOL) 325 MG tablet Take 2 tablets (650  mg total) by mouth every 6 (six) hours as needed for mild pain. Patient taking differently: Take 650 mg by mouth 2 (two) times daily. For neck pain. 11/03/14  Yes Gerlene Fee, NP  amLODipine (NORVASC) 2.5 MG tablet Take 1 tablet (2.5 mg total) by mouth daily. 01/06/14  Yes Barton Dubois, MD  aspirin 81 MG chewable tablet Chew 81 mg by mouth daily.   Yes Historical Provider, MD  baclofen (LIORESAL) 10 MG tablet Take 5  mg by mouth at bedtime.   Yes Historical Provider, MD  bisacodyl (DULCOLAX) 10 MG suppository Place 10 mg rectally as needed for mild constipation or moderate constipation.   Yes Historical Provider, MD  carvedilol (COREG) 3.125 MG tablet Take 3.125 mg by mouth daily.   Yes Historical Provider, MD  cholecalciferol (VITAMIN D) 1000 UNITS tablet Take 1,000 Units by mouth daily.   Yes Historical Provider, MD  ciprofloxacin (CIPRO) 500 MG tablet Take 1 tablet (500 mg total) by mouth 2 (two) times daily. Patient taking differently: Take 500 mg by mouth 2 (two) times daily. For 7 days 10-4 to 10-11 03/09/15  Yes Jola Schmidt, MD  clopidogrel (PLAVIX) 75 MG tablet Take 75 mg by mouth daily.    Yes Historical Provider, MD  Cranberry 475 MG CAPS Take 1 capsule by mouth 2 (two) times daily.   Yes Historical Provider, MD  diclofenac sodium (VOLTAREN) 1 % GEL Apply 2 g topically 2 (two) times daily.    Yes Historical Provider, MD  docusate sodium (COLACE) 100 MG capsule Take 200 mg by mouth daily.   Yes Historical Provider, MD  dorzolamide (TRUSOPT) 2 % ophthalmic solution Place 1 drop into both eyes 3 (three) times daily.    Yes Historical Provider, MD  escitalopram (LEXAPRO) 10 MG tablet Take 1 tablet (10 mg total) by mouth daily. 12/03/14  Yes Gerlene Fee, NP  ferrous sulfate 325 (65 FE) MG tablet Take 325 mg by mouth daily with breakfast.   Yes Historical Provider, MD  fluticasone (FLONASE) 50 MCG/ACT nasal spray Place 2 sprays into both nostrils at bedtime.   Yes Historical Provider, MD  furosemide (LASIX) 40 MG tablet Take 1 tablet (40 mg total) by mouth daily. 02/28/15  Yes Robbie Lis, MD  HYDROcodone-acetaminophen (NORCO/VICODIN) 5-325 MG tablet Take 1 tablet by mouth every 6 (six) hours as needed for moderate pain. 03/09/15  Yes Jola Schmidt, MD  lidocaine (LIDODERM) 5 % Place 1 patch onto the skin daily. Apply 1 patch to left lower back/hip every morning.  Remove & Discard patch within 12 hours or  as directed by MD 07/28/13  Yes Thurnell Lose, MD  Linaclotide [facility]) 145 MCG CAPS capsule Take 145 mcg by mouth daily.   Yes Historical Provider, MD  loratadine (CLARITIN) 10 MG tablet Take 10 mg by mouth daily.   Yes Historical Provider, MD  Multiple Vitamins-Minerals (CERTAGEN PO) Take 1 tablet by mouth daily.    Yes Historical Provider, MD  ondansetron (ZOFRAN ODT) 8 MG disintegrating tablet Take 1 tablet (8 mg total) by mouth every 8 (eight) hours as needed for nausea or vomiting. 03/09/15  Yes Jola Schmidt, MD  ondansetron (ZOFRAN) 4 MG tablet Take 1 tablet (4 mg total) by mouth every 6 (six) hours as needed for nausea. 02/28/15  Yes Robbie Lis, MD  ondansetron (ZOFRAN) 8 MG tablet Take 8 mg by mouth every 8 (eight) hours as needed for nausea or vomiting.   Yes Historical Provider, MD  Polyethyl Glycol-Propyl Glycol (SYSTANE) 0.4-0.3 % SOLN Place 1 drop into both eyes 4 (four) times daily.    Yes Historical Provider, MD  potassium chloride SA (K-DUR,KLOR-CON) 20 MEQ tablet Take 20 mEq by mouth daily. 10/23/13  Yes Janece Canterbury, MD  senna (SENOKOT) 8.6 MG TABS tablet Take 1 tablet (8.6 mg total) by mouth 2 (two) times daily. 01/05/14  Yes Barton Dubois, MD  sodium chloride (OCEAN) 0.65 % SOLN nasal spray Place 1 spray into both nostrils 4 (four) times daily. And every 4 hours as needed to moisturize nasal passages.   Yes Historical Provider, MD  Travoprost, BAK Free, (TRAVATAN) 0.004 % SOLN ophthalmic solution Place 1 drop into both eyes at bedtime.    Yes Historical Provider, MD  traZODone (DESYREL) 50 MG tablet Take 0.5 tablets (25 mg total) by mouth at bedtime as needed for sleep. Patient taking differently: Take 25 mg by mouth at bedtime.  11/03/14  Yes Gerlene Fee, NP  vitamin B-12 (CYANOCOBALAMIN) 500 MCG tablet Take 500 mcg by mouth daily.   Yes Historical Provider, MD  vitamin C (ASCORBIC ACID) 500 MG tablet Take 500 mg by mouth daily.   Yes Historical Provider, MD    Social  History:  reports that she quit smoking about 50 years ago. She has never used smokeless tobacco. She reports that she does not drink alcohol or use illicit drugs.  Family History  Problem Relation Age of Onset  . Cancer    . Heart disease       All the positives are listed in BOLD  Review of Systems:  HEENT: Headache, blurred vision, runny nose, sore throat Neck: Hypothyroidism, hyperthyroidism,,lymphadenopathy Chest : Shortness of breath, history of COPD, Asthma Heart : Chest pain, history of coronary arterey disease GI:  Nausea, vomiting, diarrhea, constipation, GERD GU: Dysuria, urgency, frequency of urination, hematuria Neuro: Stroke, seizures, syncope Psych: Depression, anxiety, hallucinations   Physical Exam: Blood pressure 124/61, pulse 86, temperature 98.9 F (37.2 C), resp. rate 20, SpO2 99 %. Constitutional:   Patient is a well-developed and well-nourished female* in no acute distress and cooperative with exam. Head: Normocephalic and atraumatic Mouth: Mucus membranes moist Eyes: PERRL, EOMI, conjunctivae normal Neck: Supple, No Thyromegaly Cardiovascular: RRR, S1 normal, S2 normal Pulmonary/Chest: CTAB, no wheezes, rales, or rhonchi Abdominal: Soft. Non-tender, non-distended, bowel sounds are normal, no masses, organomegaly, or guarding present.  Neurological: A&O x3, Strength is normal and symmetric bilaterally, cranial nerve II-XII are grossly intact, no focal motor deficit, sensory intact to light touch bilaterally.  Extremities : No Cyanosis, Clubbing or Edema Skin- stage II decubitus ulcer measuring 2 x 2 centimeters noted in the sacral  area with surrounding erythema.  Labs on Admission:  Basic Metabolic Panel:  Recent Labs Lab 03/09/15 1248 03/11/15 1100  NA 139 138  K 3.2* 3.6  CL 111 108  CO2 22 20*  GLUCOSE 137* 180*  BUN 14 51*  CREATININE 1.07* 1.76*  CALCIUM 9.2 9.0   Liver Function Tests:  Recent Labs Lab 03/09/15 1248 03/11/15 1100   AST 18 16  ALT 9* 7*  ALKPHOS 72 60  BILITOT 0.6 0.3  PROT 7.4 6.5  ALBUMIN 3.7 3.3*    Recent Labs Lab 03/09/15 1248 03/11/15 1100  LIPASE 24 23   No results for input(s): AMMONIA in the last 168 hours. CBC:  Recent Labs Lab 03/09/15 1248 03/11/15 1100  WBC 5.8 5.6  NEUTROABS  --  3.7  HGB 10.2* 7.9*  HCT 30.3*  23.4*  MCV 103.1* 103.1*  PLT 221 203     Radiological Exams on Admission: Dg Abd 1 View  03/11/2015   CLINICAL DATA:  81 year old female with abdominal pain nausea vomiting, abdominal distention. Initial encounter.  EXAM: ABDOMEN - 1 VIEW  COMPARISON:  CT Abdomen and Pelvis 03/09/2015, and earlier  FINDINGS: Supine view. Continued Stable gas pattern compared to the recent CT and radiographs with gaseous distension of redundant sigmoid colon. No upstream dilated bowel loops. No definite pneumoperitoneum. Stable cholecystectomy clips. Stable visualized osseous structures. Calcified aortic and iliac artery atherosclerosis.  IMPRESSION: Stable bowel gas pattern over this series of exams with continued gaseous distension of the redundant sigmoid colon. Favor chronic decreased colonic motility.   Electronically Signed   By: Genevie Ann M.D.   On: 03/11/2015 11:44   Ct Abdomen Pelvis W Contrast  03/09/2015   CLINICAL DATA:  Mid abdomen pain, constipation since yesterday.  EXAM: CT ABDOMEN AND PELVIS WITH CONTRAST  TECHNIQUE: Multidetector CT imaging of the abdomen and pelvis was performed using the standard protocol following bolus administration of intravenous contrast.  CONTRAST:  147mL OMNIPAQUE IOHEXOL 300 MG/ML  SOLN  COMPARISON:  February 26, 2015, Oct 09, 2013, July 24, 2013  FINDINGS: The liver, spleen, adrenal glands are normal. Horseshoe kidney is identified unchanged. Patient is status post prior cholecystectomy. There is a low-density mass in the body of pancreas measuring 2.4 x 1.5 cm unchanged compared to prior CT of July 24, 2013. Atherosclerosis of the  abdominal aorta is identified without aneurysmal dilatation. There is no abdominal lymphadenopathy.  There is no small bowel obstruction or diverticulitis. The appendix is not seen but no inflammation is noted around the cecum. Mild bowel content is identified in the colon.  Fluid-filled bladder is normal. Pelvic phleboliths are identified. Left hip replacement is noted. There is mild atelectasis or scar of the left lung base. There is no focal pneumonia or pleural effusion. The heart size is enlarged. There is a small pericardial effusion. Images of the bones demonstrate focal sclerosis of the superior endplate of L4 unchanged since February 2015. There is lucency in the L2 vertebral body unchanged since February 2015.  IMPRESSION: There is no small bowel obstruction or diverticulitis. Mild bowel content is identified in the colon.  Low-density mass in the body of pancreas measuring 2.4 x 1.5 cm unchanged compared to prior CT of July 24, 2013. If clinically indicated, consider further evaluation with MRI of pancreas.   Electronically Signed   By: Abelardo Diesel M.D.   On: 03/09/2015 14:57    EKG: Independently reviewed. Atrial fibrillation   Assessment/Plan Active Problems:   GI bleed   Melena   UTI   Sacral ulcer    Melena Patient presenting  with dark stools, fecal occult blood is positive in the ED. We'll start the patient on Protonix infusion, keep nothing by mouth. Hold aspirin and Plavix. GI has been consulted for possible endoscopy in a.m by the ED physician.  UTI Recent urine culture grew Escherichia coli sensitive to ciprofloxacin. We'll continue IV  Cipro 400 every 12 hours.  Sacral ulcer Patient has stage II decubitus ulcer in the sacral area. Will get wound care to  see the patient.  Anemia Patient hemoglobin dropped to 7.9 from 10.2 on 03/09/2015. Likely from upper GI bleed. Will check H&H every 6 hours and transfuse if hemoglobin drops less than 7.  Chronic diastolic  CHF Patient has history of diastolic CHF. Currently compensated. Hold Lasix  at this time Patient has restarted on gentle IV hydration. We'll keep close watch on the I's and O's.  Hypertension Blood pressures are controlled. Continue Coreg. Will hold amlodipine at this time.  Atrial fibrillation EKG shows A. fib on rate control. Continue Coreg. Will hold aspirin and Plavix at this time due to the upper GI bleed.   Code status: Patient is full code  Family discussion: Admission, patients condition and plan of care including tests being ordered have been discussed with the patient and *his daughter at bedside* who indicate understanding and agree with the plan and Code Status.   Time Spent on Admission: 60 min  [facility] Pager: [phone] 03/11/2015, 12:43 PM  If 7PM-7AM, please contact night-coverage  [email or web address]  Password [email or web address]

## 2015-03-11 NOTE — ED Notes (Signed)
Family at bedside. 

## 2015-03-12 ENCOUNTER — Encounter (HOSPITAL_COMMUNITY)
Admission: EM | Disposition: A | Discharge status: Skilled Nursing Facility | Payer: Self-pay | Source: Home / Self Care | Attending: Family Medicine

## 2015-03-12 ENCOUNTER — Telehealth (HOSPITAL_COMMUNITY): Payer: Self-pay

## 2015-03-12 ENCOUNTER — Encounter (HOSPITAL_COMMUNITY): Payer: Self-pay

## 2015-03-12 ENCOUNTER — Inpatient Hospital Stay (HOSPITAL_COMMUNITY): Payer: Medicare Other | Admitting: Anesthesiology

## 2015-03-12 DIAGNOSIS — K269 Duodenal ulcer, unspecified as acute or chronic, without hemorrhage or perforation: Secondary | ICD-10-CM

## 2015-03-12 DIAGNOSIS — L8990 Pressure ulcer of unspecified site, unspecified stage: Secondary | ICD-10-CM

## 2015-03-12 DIAGNOSIS — K921 Melena: Secondary | ICD-10-CM

## 2015-03-12 HISTORY — PX: ESOPHAGOGASTRODUODENOSCOPY (EGD) WITH PROPOFOL: SHX5813

## 2015-03-12 LAB — CBC
HEMATOCRIT: 23 % — AB (ref 36.0–46.0)
HEMOGLOBIN: 7.6 g/dL — AB (ref 12.0–15.0)
MCH: 33.5 pg (ref 26.0–34.0)
MCHC: 33 g/dL (ref 30.0–36.0)
MCV: 101.3 fL — AB (ref 78.0–100.0)
Platelets: 168 10*3/uL (ref 150–400)
RBC: 2.27 MIL/uL — AB (ref 3.87–5.11)
RDW: 15.2 % (ref 11.5–15.5)
WBC: 4.5 10*3/uL (ref 4.0–10.5)

## 2015-03-12 LAB — COMPREHENSIVE METABOLIC PANEL
ALBUMIN: 2.8 g/dL — AB (ref 3.5–5.0)
ALK PHOS: 49 U/L (ref 38–126)
ALT: 6 U/L — AB (ref 14–54)
AST: 12 U/L — AB (ref 15–41)
Anion gap: 6 (ref 5–15)
BILIRUBIN TOTAL: 0.6 mg/dL (ref 0.3–1.2)
BUN: 40 mg/dL — AB (ref 6–20)
CO2: 21 mmol/L — ABNORMAL LOW (ref 22–32)
CREATININE: 1.54 mg/dL — AB (ref 0.44–1.00)
Calcium: 8.6 mg/dL — ABNORMAL LOW (ref 8.9–10.3)
Chloride: 114 mmol/L — ABNORMAL HIGH (ref 101–111)
GFR calc Af Amer: 34 mL/min — ABNORMAL LOW (ref >60)
GFR, EST NON AFRICAN AMERICAN: 29 mL/min — AB (ref >60)
GLUCOSE: 138 mg/dL — AB (ref 65–99)
POTASSIUM: 3 mmol/L — AB (ref 3.5–5.1)
Sodium: 141 mmol/L (ref 135–145)
TOTAL PROTEIN: 5.6 g/dL — AB (ref 6.5–8.1)

## 2015-03-12 LAB — HEMOGLOBIN AND HEMATOCRIT, BLOOD
HCT: 23.4 % — ABNORMAL LOW (ref 36.0–46.0)
HEMATOCRIT: 22.6 % — AB (ref 36.0–46.0)
HEMOGLOBIN: 8.1 g/dL — AB (ref 12.0–15.0)
Hemoglobin: 7.4 g/dL — ABNORMAL LOW (ref 12.0–15.0)

## 2015-03-12 SURGERY — ESOPHAGOGASTRODUODENOSCOPY (EGD) WITH PROPOFOL
Anesthesia: Monitor Anesthesia Care

## 2015-03-12 MED ORDER — ONDANSETRON HCL 4 MG/2ML IJ SOLN
INTRAMUSCULAR | Status: AC
  Filled 2015-03-12: qty 2

## 2015-03-12 MED ORDER — PROPOFOL 500 MG/50ML IV EMUL
INTRAVENOUS | Status: DC | PRN
  Administered 2015-03-12: 100 ug/kg/min via INTRAVENOUS

## 2015-03-12 MED ORDER — LACTATED RINGERS IV SOLN
INTRAVENOUS | Status: DC | PRN
  Administered 2015-03-12: 12:00:00 via INTRAVENOUS

## 2015-03-12 MED ORDER — SODIUM CHLORIDE 0.9 % IV SOLN: INTRAVENOUS | Status: DC

## 2015-03-12 MED ORDER — PHENYLEPHRINE HCL 10 MG/ML IJ SOLN
INTRAMUSCULAR | Status: DC | PRN
  Administered 2015-03-12: 80 ug via INTRAVENOUS

## 2015-03-12 MED ORDER — POTASSIUM CHLORIDE 10 MEQ/100ML IV SOLN
10.0000 meq | INTRAVENOUS | Status: AC
  Administered 2015-03-12 (×3): 10 meq via INTRAVENOUS
  Filled 2015-03-12 (×3): qty 100

## 2015-03-12 MED ORDER — PROPOFOL 10 MG/ML IV BOLUS
INTRAVENOUS | Status: AC
  Filled 2015-03-12: qty 20

## 2015-03-12 MED ORDER — PHENYLEPHRINE 40 MCG/ML (10ML) SYRINGE FOR IV PUSH (FOR BLOOD PRESSURE SUPPORT)
PREFILLED_SYRINGE | INTRAVENOUS | Status: AC
  Filled 2015-03-12: qty 10

## 2015-03-12 SURGICAL SUPPLY — 14 items

## 2015-03-12 NOTE — Op Note (Signed)
[place], [id number]   ENDOSCOPY PROCEDURE REPORT  PATIENT: Maria, Christian  MR#: [id number] BIRTHDATE: February 16, 1925 , 89  yrs. old GENDER: female ENDOSCOPIST: [name], MD REFERRED BY: PROCEDURE DATE:  03/12/2015 PROCEDURE:  EGD w/ biopsy ASA CLASS:     Class III INDICATIONS:  melena. MEDICATIONS: Monitored anesthesia care TOPICAL ANESTHETIC:  DESCRIPTION OF PROCEDURE: After the risks benefits and alternatives of the procedure were thoroughly explained, informed consent was obtained.  The Pentax Gastroscope [id number] endoscope was introduced through the mouth and advanced to the second portion of the duodenum , Without limitations.  The instrument was slowly withdrawn as the mucosa was fully examined.    In the duodenal bulb there was a 1.5-2 cm ulcer with clot at the base.  There was no active bleeding.  Surrounding mucosa was edematous.  Biopsies of the surrounding mucosa were done.   Except for the findings listed, the EGD was otherwise normal.  Retroflexed views revealed no abnormalities.     The scope was then withdrawn from the patient and the procedure completed.  COMPLICATIONS: There were no immediate complications.  ENDOSCOPIC IMPRESSION: 1.   In the duodenal bulb there was a 1.5-2 cm ulcer with clot at the base.  There was no active bleeding.  Surrounding mucosa was edematous.  Biopsies of the surrounding mucosa were done 2.   EGD was otherwise normal  RECOMMENDATIONS: 1.  continue to hold Plavix 2.  Await pathology report regarding H pylori  REPEAT EXAM:  eSigned:  [name], MD 03/12/2015 12:35 PM    CC:

## 2015-03-12 NOTE — Transfer of Care (Signed)
Immediate Anesthesia Transfer of Care Note  Patient: Maria Christian  Procedure(s) Performed: Procedure(s): ESOPHAGOGASTRODUODENOSCOPY (EGD) WITH PROPOFOL (N/A)  Patient Location: PACU  Anesthesia Type:MAC  Level of Consciousness:  sedated, patient cooperative and responds to stimulation  Airway & Oxygen Therapy:Patient Spontanous Breathing and Patient connected to face mask oxgen  Post-op Assessment:  Report given to PACU RN and Post -op Vital signs reviewed and stable  Post vital signs:  Reviewed and stable  Last Vitals:  Filed Vitals:   03/12/15 1128  BP: 131/47  Pulse: 71  Temp: 36.9 C  Resp: 13    Complications: No apparent anesthesia complications

## 2015-03-12 NOTE — Anesthesia Postprocedure Evaluation (Signed)
  Anesthesia Post-op Note  Patient: Maria Christian  Procedure(s) Performed: Procedure(s) (LRB): ESOPHAGOGASTRODUODENOSCOPY (EGD) WITH PROPOFOL (N/A)  Patient Location: PACU  Anesthesia Type: MAC  Level of Consciousness: awake and alert   Airway and Oxygen Therapy: Patient Spontanous Breathing  Post-op Pain: mild  Post-op Assessment: Post-op Vital signs reviewed, Patient's Cardiovascular Status Stable, Respiratory Function Stable, Patent Airway and No signs of Nausea or vomiting  Last Vitals:  Filed Vitals:   03/12/15 1240  BP: 106/29  Pulse: 69  Temp:   Resp: 15    Post-op Vital Signs: stable   Complications: No apparent anesthesia complications

## 2015-03-12 NOTE — Progress Notes (Signed)
Critical Hgb 6.8. MD on call notified; new orders placed to transfuse 1u PRBCs.

## 2015-03-12 NOTE — Progress Notes (Signed)
Protonix drip d/c per telephone order from [employer], [place].

## 2015-03-12 NOTE — Care Management Note (Signed)
Case Management Note  Patient Details  Name: ARLETTA LUMADUE MRN: [id number] Date of Birth: 1925-04-18  Subjective/Objective:            Lower gi bleed requiring transfusion        Action/Plan:Date:  Oct. 07, 2016 U.R. performed for needs and level of care. Will continue to follow for Case Management needs.  Velva Harman, RN, BSN, [place]   [phone]   Expected Discharge Date:                  Expected Discharge Plan:  Skilled Nursing Facility  In-House Referral:  Clinical Social Work  Discharge planning Services  CM Consult  Post Acute Care Choice:  NA Choice offered to:  NA  DME Arranged:    DME Agency:     HH Arranged:    [place] Agency:     Status of Service:  In process, will continue to follow  Medicare Important Message Given:    Date Medicare IM Given:    Medicare IM give by:    Date Additional Medicare IM Given:    Additional Medicare Important Message give by:     If discussed at [place] of Stay Meetings, dates discussed:    Additional Comments:  Leeroy Cha, RN 03/12/2015, 10:47 AM

## 2015-03-12 NOTE — Telephone Encounter (Signed)
Post ED Visit - Positive Culture Follow-up  Culture report reviewed by antimicrobial stewardship pharmacist:  [x]  Heide Guile, Pharm.D., BCPS []  Alycia Rossetti, Pharm.D., BCPS []  [place], Pharm.D., BCPS, AAHIVP []  [name], Pharm.D., BCPS, AAHIVP []  [employer], Pharm.D. []  Milus Glazier, Pharm.D.  Positive urine culture, >/= 100,000 colonies -> E Coli Treated with Ciprofloxacin, organism sensitive to the same and no further patient follow-up is required at this time.  Dortha Kern 03/12/2015, 9:55 AM

## 2015-03-12 NOTE — Progress Notes (Signed)
[facility] PROGRESS NOTE  Maria Christian WLN:[id number] DOB: 15-Oct-1924 DOA: 03/11/2015 PCP: No primary care provider on file.  Assessment/Plan: *Melena Patient presented with dark stools, fecal occult blood is positive in the ED. Started  on Protonix infusion, . Hold aspirin and Plavix. GI has been consulted for possible endoscopy  today.  UTI Recent urine culture grew Escherichia coli sensitive to ciprofloxacin. We'll continue IV Cipro 400 every 12 hours.  Sacral ulcer Patient has stage II decubitus ulcer in the sacral area. Will get wound care to see the patient.  Anemia Patient hemoglobin dropped to 6.8 last night and required one unit PRBC and this morning the hemoglobin is 7.6. Likely from upper GI bleed. Will check H&H every 6 hours and transfuse if hemoglobin drops less than 7.  Chronic diastolic CHF Patient has history of diastolic CHF. Currently compensated. Hold Lasix at this time Patient has restarted on gentle IV hydration. We'll keep close watch on the I's and O's.  Hypertension Blood pressures are controlled. Continue Coreg. Will hold amlodipine at this time.  Atrial fibrillation EKG shows A. fib on rate control. Continue Coreg. Will hold aspirin and Plavix at this time due to the upper GI bleed.  Code Status: Full code Family Communication: Discussed with daughter at bedisde Disposition Plan: To be decided   Consultants:  GI  Procedures:  None  Antibiotics:  None  HPI/Subjective: 81 year old female who  was brought to the hospital from skilled facility after she was noted to have dark-colored stool. Patient was recently seen in the ED and was diagnosed with UTI and was discharged on ciprofloxacin. Patient says that she has been having abdominal discomfort, nausea and intermittent vomiting. This morning she was noted to have dark colored stool which looks like blood. She denies chest pain, no shortness of breath. Denies nausea and vomiting  today. In the ED she was found to have hemoglobin of 7.9 which is down from 10.2 on 03/09/2015. The urine culture done on 03/09/2015 the Escherichia coli sensitive to ciprofloxacin.  This morning she is waiting to go for EGD. Denies abdominal pain.  Objective: Filed Vitals:   03/12/15 1240  BP: 106/29  Pulse: 69  Temp:   Resp: 15    Intake/Output Summary (Last 24 hours) at 03/12/15 1246 Last data filed at 03/12/15 1229  Gross per 24 hour  Intake 1256.67 ml  Output      0 ml  Net 1256.67 ml   Filed Weights   03/11/15 1306  Weight: 65 kg (143 lb 4.8 oz)    Exam:   General:  Appears in no acute distress  Cardiovascular: S1S2 RRR  Respiratory: Clear bilaterally  Abdomen: soft, nontender, no organomegaly  Musculoskeletal: No cyanosis/clubbing or edema of the lower extremities   Data Reviewed: Basic Metabolic Panel:  Recent Labs Lab 03/09/15 1248 03/11/15 1100 03/12/15 0525  NA 139 138 141  K 3.2* 3.6 3.0*  CL 111 108 114*  CO2 22 20* 21*  GLUCOSE 137* 180* 138*  BUN 14 51* 40*  CREATININE 1.07* 1.76* 1.54*  CALCIUM 9.2 9.0 8.6*   Liver Function Tests:  Recent Labs Lab 03/09/15 1248 03/11/15 1100 03/12/15 0525  AST 18 16 12*  ALT 9* 7* 6*  ALKPHOS 72 60 49  BILITOT 0.6 0.3 0.6  PROT 7.4 6.5 5.6*  ALBUMIN 3.7 3.3* 2.8*    Recent Labs Lab 03/09/15 1248 03/11/15 1100  LIPASE 24 23   No results for input(s): AMMONIA in the last 168  hours. CBC:  Recent Labs Lab 03/09/15 1248 03/11/15 1100 03/11/15 1313 03/11/15 2050 03/12/15 0525  WBC 5.8 5.6  --   --  4.5  NEUTROABS  --  3.7  --   --   --   HGB 10.2* 7.9* 7.4* 6.8* 7.6*  HCT 30.3* 23.4* 21.8* 20.0* 23.0*  MCV 103.1* 103.1*  --   --  101.3*  PLT 221 203  --   --  168   Cardiac Enzymes: No results for input(s): CKTOTAL, CKMB, CKMBINDEX, TROPONINI in the last 168 hours. BNP (last 3 results) No results for input(s): BNP in the last 8760 hours.  ProBNP (last 3 results) No results  for input(s): PROBNP in the last 8760 hours.  CBG: No results for input(s): GLUCAP in the last 168 hours.  Recent Results (from the past 240 hour(s))  Urine culture     Status: None   Collection Time: 03/09/15  4:03 PM  Result Value Ref Range Status   Specimen Description URINE, CATHETERIZED  Final   Special Requests NONE  Final   Culture   Final    >=100,000 COLONIES/mL ESCHERICHIA COLI Confirmed Extended Spectrum Beta-Lactamase Producer (ESBL) Performed at [facility]    Report Status 03/11/2015 FINAL  Final   Organism ID, Bacteria ESCHERICHIA COLI  Final      Susceptibility   Escherichia coli - MIC*    AMPICILLIN >=32 RESISTANT Resistant     CEFAZOLIN >=64 RESISTANT Resistant     CEFTRIAXONE 32 RESISTANT Resistant     CIPROFLOXACIN 0.5 SENSITIVE Sensitive     GENTAMICIN <=1 SENSITIVE Sensitive     IMIPENEM <=0.25 SENSITIVE Sensitive     NITROFURANTOIN <=16 SENSITIVE Sensitive     TRIMETH/SULFA >=320 RESISTANT Resistant     AMPICILLIN/SULBACTAM >=32 RESISTANT Resistant     PIP/TAZO 64 INTERMEDIATE Intermediate     * >=100,000 COLONIES/mL ESCHERICHIA COLI     Studies: Dg Abd 1 View  03/11/2015   CLINICAL DATA:  81 year old female with abdominal pain nausea vomiting, abdominal distention. Initial encounter.  EXAM: ABDOMEN - 1 VIEW  COMPARISON:  CT Abdomen and Pelvis 03/09/2015, and earlier  FINDINGS: Supine view. Continued Stable gas pattern compared to the recent CT and radiographs with gaseous distension of redundant sigmoid colon. No upstream dilated bowel loops. No definite pneumoperitoneum. Stable cholecystectomy clips. Stable visualized osseous structures. Calcified aortic and iliac artery atherosclerosis.  IMPRESSION: Stable bowel gas pattern over this series of exams with continued gaseous distension of the redundant sigmoid colon. Favor chronic decreased colonic motility.   Electronically Signed   By: Genevie Ann M.D.   On: 03/11/2015 11:44    Scheduled Meds: .  carvedilol  3.125 mg Oral Daily  . ciprofloxacin  400 mg Intravenous Q24H  . dorzolamide  1 drop Both Eyes TID  . fluticasone  2 spray Each Nare QHS  . latanoprost  1 drop Both Eyes QHS  . lidocaine  1 patch Transdermal Q24H  . loratadine  10 mg Oral Daily  . pantoprazole (PROTONIX) IV  40 mg Intravenous Q12H   Continuous Infusions: . sodium chloride 50 mL/hr at 03/12/15 0758  . sodium chloride    . pantoprozole (PROTONIX) infusion 8 mg/hr (03/12/15 0307)    Active Problems:   Diastolic CHF, chronic (HCC)   Coronary artery disease   Chronic atrial fibrillation (HCC)   CKD (chronic kidney disease) stage 3, GFR 30-59 ml/min   GI bleed   Melena   Pressure ulcer   Duodenal  ulcer hemorrhage    Time spent: 25 min    [facility] Pager [phone]. If 7PM-7AM, please contact night-coverage at [email or web address], password [facility] 03/12/2015, 12:46 PM  LOS: 1 day

## 2015-03-12 NOTE — Consult Note (Signed)
WOC wound consult note Reason for Consult: Stage 2 pressure injury to sacrum.   Wound type:Pressure injury, present on admission. States they have been applying a cream to it at the SNF. Pressure Ulcer POA: Yes Measurement: 2 lesions, left and right aspect of sacrum, each is 0.5 cm in diameter with 2 cm erythema between the two.  Bilateral heels are intact, but nonblanchable redness.  Will float heels with a pillow under the calf.  Wound bed:100% pale pink Drainage (amount, consistency, odor)Minimal serosanguinous.  No odor.   Periwound:Erythema Dressing procedure/placement/frequency:Cleanse sacral wounds with NS and pat gently dry.  Apply Allevyn silicone foam.  Change every 3 days and PRN soilage.  Float heels with a pillow under her calves.  Will not follow at this time.  Please re-consult if needed.  Domenic Moras RN BSN [place] Pager [phone]

## 2015-03-13 DIAGNOSIS — K264 Chronic or unspecified duodenal ulcer with hemorrhage: Principal | ICD-10-CM

## 2015-03-13 LAB — BASIC METABOLIC PANEL
Anion gap: 7 (ref 5–15)
BUN: 24 mg/dL — ABNORMAL HIGH (ref 6–20)
CALCIUM: 8.5 mg/dL — AB (ref 8.9–10.3)
CO2: 20 mmol/L — ABNORMAL LOW (ref 22–32)
CREATININE: 1.37 mg/dL — AB (ref 0.44–1.00)
Chloride: 116 mmol/L — ABNORMAL HIGH (ref 101–111)
GFR, EST AFRICAN AMERICAN: 39 mL/min — AB (ref >60)
GFR, EST NON AFRICAN AMERICAN: 33 mL/min — AB (ref >60)
Glucose, Bld: 164 mg/dL — ABNORMAL HIGH (ref 65–99)
Potassium: 3.5 mmol/L (ref 3.5–5.1)
SODIUM: 143 mmol/L (ref 135–145)

## 2015-03-13 LAB — CBC
HCT: 23.9 % — ABNORMAL LOW (ref 36.0–46.0)
Hemoglobin: 7.8 g/dL — ABNORMAL LOW (ref 12.0–15.0)
MCH: 34.1 pg — ABNORMAL HIGH (ref 26.0–34.0)
MCHC: 32.6 g/dL (ref 30.0–36.0)
MCV: 104.4 fL — ABNORMAL HIGH (ref 78.0–100.0)
PLATELETS: 168 10*3/uL (ref 150–400)
RBC: 2.29 MIL/uL — ABNORMAL LOW (ref 3.87–5.11)
RDW: 15.8 % — AB (ref 11.5–15.5)
WBC: 4.4 10*3/uL (ref 4.0–10.5)

## 2015-03-13 LAB — HEMOGLOBIN AND HEMATOCRIT, BLOOD
HCT: 22.7 % — ABNORMAL LOW (ref 36.0–46.0)
HEMATOCRIT: 23 % — AB (ref 36.0–46.0)
HEMOGLOBIN: 7.6 g/dL — AB (ref 12.0–15.0)
Hemoglobin: 7.5 g/dL — ABNORMAL LOW (ref 12.0–15.0)

## 2015-03-13 LAB — MAGNESIUM: Magnesium: 1.9 mg/dL (ref 1.7–2.4)

## 2015-03-13 LAB — TROPONIN I
Troponin I: <0.03 ng/mL (ref <0.031)
Troponin I: 0.03 ng/mL (ref <0.031)

## 2015-03-13 MED ORDER — CIPROFLOXACIN HCL 500 MG PO TABS
500.0000 mg | ORAL_TABLET | ORAL | Status: DC
  Administered 2015-03-13 – 2015-03-14 (×2): 500 mg via ORAL
  Filled 2015-03-13 (×3): qty 1

## 2015-03-13 MED ORDER — SODIUM CHLORIDE 0.9 % IV SOLN
INTRAVENOUS | Status: DC
  Administered 2015-03-13 – 2015-03-14 (×2): via INTRAVENOUS
  Filled 2015-03-13 (×3): qty 1000

## 2015-03-13 NOTE — Progress Notes (Signed)
Patient noted to be extremely pale, cardiac monitoring notified this nurse of SVR episodes with PVCs.  Patient noted to be full code.  MD notified.  Daughter and granddaughter notified of change in condition.  Spoke with son, [name], on the phone.  Notified MD of request to speak with him. Virginia Rochester, RN

## 2015-03-13 NOTE — Clinical Social Work Note (Signed)
Clinical Social Work Assessment  Patient Details  Name: Maria Christian MRN: [id number] Date of Birth: [birth year]  Date of referral:  03/13/15               Reason for consult:  Facility Placement                Permission sought to share information with:  Family Supports, Chartered certified accountant granted to share information::  Yes, Verbal Permission Granted  Name::        Agency::     Relationship::     Contact Information:     Housing/Transportation Living arrangements for the past 2 months:  [place] [facility] Source of Information:  Other (Comment Required) (grand daughter/Patty) Patient Interpreter Needed:    Criminal Activity/Legal Involvement Pertinent to Current Situation/Hospitalization:    Significant Relationships:  Adult Children Lives with:  Facility Res[facility] ([facility]) Do you feel safe going back to the place where you live?  Yes Need for family participation in patient care:  Yes (Comment)  Care giving concerns:  Pt's grand daughter does not have any concerns at this time   Facilities manager / plan:  CSW met with pt and her grand daughter at bedside to discuss discharge needs.  CSW provided explanation of role and prompted pt and family to discuss history and needs.  CSW will send pt information to [place] where pt has been residing for the past 2 years.  Employment status:  Retired Forensic scientist:  Medicare PT Recommendations:  Not assessed at this time Information / Referral to community resources:     Patient/Family's Response to care:  Pt slightly confused and wanted her granddaughter to speak on her behalf.  Pt's grand daughter discussed pt had been living at [facility] for the past 2 years and the plan is for pt to return at discharge.  Pt's son [name] is  Pt's POA so he may be called with any concerns regarding discharge.   Patient/Family's  Understanding of and Emotional Response to Diagnosis, Current Treatment, and Prognosis:  Pt confused but pt's grand daughter understands pt's medical issues.  She discussed gaining clarity from the RN this morning and stated she would continue to be involved.  Emotional Assessment Appearance:  Appears stated age Attitude/Demeanor/Rapport:   (pleasant) Affect (typically observed):  Accepting Orientation:  Oriented to Self Alcohol / Substance use:    Psych involvement (Current and /or in the community):     Discharge Needs  Concerns to be addressed:    Readmission within the last 30 days:    Current discharge risk:    Barriers to Discharge:  No Barriers Identified   Carlean Jews, LCSW 03/13/2015, 12:03 PM

## 2015-03-13 NOTE — Progress Notes (Signed)
Cross cover [facility] Subjective: Patient denies having any nausea, vomiting or abdominal pain. Still passing some melenic stools. Large bleeding ulcer in the duodenal bulb noted on EGD.  Objective: Vital signs in last 24 hours: Temp:  [98.2 F (36.8 C)-98.6 F (37 C)] 98.6 F (37 C) (10/08 0530) Pulse Rate:  [63-86] 69 (10/08 0530) Resp:  [13-20] 18 (10/08 0530) BP: (100-131)/(29-64) 100/54 mmHg (10/08 0530) SpO2:  [98 %-100 %] 98 % (10/08 0530) Last BM Date: 03/12/15  Intake/Output from previous day: 10/07 0701 - 10/08 0700 In: 1490 [P.O.:240; I.V.:1250] Out: -  Intake/Output this shift:   General appearance: cooperative, appears stated age, fatigued and no distress Resp: clear to auscultation bilaterally Cardio: irregularly regular rate and rhythm, S1, S2 normal, no murmur, click, rub or gallop GI: soft, non-tender; bowel sounds normal; no masses,  no organomegaly Extremities: extremities normal, atraumatic, no cyanosis or edema  Lab Results:  Recent Labs  03/11/15 1100  03/12/15 0525 03/12/15 1317 03/12/15 2205 03/13/15 0609  WBC 5.6  --  4.5  --   --  4.4  HGB 7.9*  < > 7.6* 8.1* 7.4* 7.8*  HCT 23.4*  < > 23.0* 23.4* 22.6* 23.9*  PLT 203  --  168  --   --  168  < > = values in this interval not displayed. BMET  Recent Labs  03/11/15 1100 03/12/15 0525 03/13/15 0609  NA 138 141 143  K 3.6 3.0* 3.5  CL 108 114* 116*  CO2 20* 21* 20*  GLUCOSE 180* 138* 164*  BUN 51* 40* 24*  CREATININE 1.76* 1.54* 1.37*  CALCIUM 9.0 8.6* 8.5*   LFT  Recent Labs  03/12/15 0525  PROT 5.6*  ALBUMIN 2.8*  AST 12*  ALT 6*  ALKPHOS 49  BILITOT 0.6   PT/INR  Recent Labs  03/11/15 1101  LABPROT 17.2*  INR 1.40  Studies/Results: Dg Abd 1 View  03/11/2015   CLINICAL DATA:  81 year old female with abdominal pain nausea vomiting, abdominal distention. Initial encounter.  EXAM: ABDOMEN - 1 VIEW  COMPARISON:  CT Abdomen and Pelvis 03/09/2015, and earlier  FINDINGS:  Supine view. Continued Stable gas pattern compared to the recent CT and radiographs with gaseous distension of redundant sigmoid colon. No upstream dilated bowel loops. No definite pneumoperitoneum. Stable cholecystectomy clips. Stable visualized osseous structures. Calcified aortic and iliac artery atherosclerosis.  IMPRESSION: Stable bowel gas pattern over this series of exams with continued gaseous distension of the redundant sigmoid colon. Favor chronic decreased colonic motility.   Electronically Signed   By: Genevie Ann M.D.   On: 03/11/2015 11:44   Medications: I have reviewed the patient's current medications.  Assessment/Plan: 1) Large duodenal ulcer with post hemorrhagic anemia-continue PPI's-Avoid ALL NSAIDS. Hold off on anticoagulants for now. 2) FTT/malnutrition-with an albumin of 2.8  LOS: 2 days   Fabien Travelstead 03/13/2015, 7:45 AM

## 2015-03-13 NOTE — Progress Notes (Signed)
[facility] PROGRESS NOTE  Maria Christian RWE:[id number] DOB: 29-Aug-1924 DOA: 03/11/2015 PCP: No primary care provider on file.  Assessment/Plan:  Bleeding ulcer in the duodenal bulb EGD showed large bleeding ulcer in the duodenal bulb Started  on Protonix. Continue to hold aspirin and Plavix.  Bradycardia Telemetry showing intermittent episodes of bradycardia, will hold Coreg at this time. Continue to monitor on telemetry  UTI Recent urine culture grew Escherichia coli sensitive to ciprofloxacin. We'll continue IV Cipro 400 every 12 hours.  Sacral ulcer Patient has stage II decubitus ulcer in the sacral area. Will get wound care to see the patient.  Anemia Patient hemoglobin dropped to 6.8 last night and required one unit PRBC and this morning the hemoglobin is 7.6. Will check H&H every 6 hours and transfuse if hemoglobin drops less than 7.  Chronic diastolic CHF Patient has history of diastolic CHF. Currently compensated. Hold Lasix at this time Patient has restarted on gentle IV hydration. We'll keep close watch on the I's and O's.  Hypertension Blood pressures are controlled.  Will hold amlodipine and Coreg at this time.  Atrial fibrillation EKG shows A. fib on rate control.  Will hold aspirin and Plavix at this time due to the upper GI bleed.  Code Status: Full code Family Communication: Discussed with daughter at bedisde Disposition Plan: To be decided   Consultants:  GI  Procedures:  None  Antibiotics:  None  HPI/Subjective: 81 year old female who  was brought to the hospital from skilled facility after she was noted to have dark-colored stool. Patient was recently seen in the ED and was diagnosed with UTI and was discharged on ciprofloxacin. Patient says that she has been having abdominal discomfort, nausea and intermittent vomiting. This morning she was noted to have dark colored stool which looks like blood. She denies chest pain, no shortness  of breath. Denies nausea and vomiting today. In the ED she was found to have hemoglobin of 7.9 which is down from 10.2 on 03/09/2015. The urine culture done on 03/09/2015 the Escherichia coli sensitive to ciprofloxacin.  This morning patient complained of left side chest pain, telemetry showed intermittent episodes of bradycardia  Objective: Filed Vitals:   03/13/15 1422  BP: 108/59  Pulse: 52  Temp: 98.2 F (36.8 C)  Resp: 18    Intake/Output Summary (Last 24 hours) at 03/13/15 1518 Last data filed at 03/13/15 1241  Gross per 24 hour  Intake   1730 ml  Output      0 ml  Net   1730 ml   Filed Weights   03/11/15 1306  Weight: 65 kg (143 lb 4.8 oz)    Exam:   General:  Appears in no acute distress  Cardiovascular: S1S2 irregular  Respiratory: Clear bilaterally  Abdomen: soft, nontender, no organomegaly  Musculoskeletal: No cyanosis/clubbing or edema of the lower extremities   Data Reviewed: Basic Metabolic Panel:  Recent Labs Lab 03/09/15 1248 03/11/15 1100 03/12/15 0525 03/13/15 0609  NA 139 138 141 143  K 3.2* 3.6 3.0* 3.5  CL 111 108 114* 116*  CO2 22 20* 21* 20*  GLUCOSE 137* 180* 138* 164*  BUN 14 51* 40* 24*  CREATININE 1.07* 1.76* 1.54* 1.37*  CALCIUM 9.2 9.0 8.6* 8.5*   Liver Function Tests:  Recent Labs Lab 03/09/15 1248 03/11/15 1100 03/12/15 0525  AST 18 16 12*  ALT 9* 7* 6*  ALKPHOS 72 60 49  BILITOT 0.6 0.3 0.6  PROT 7.4 6.5 5.6*  ALBUMIN 3.7  3.3* 2.8*    Recent Labs Lab 03/09/15 1248 03/11/15 1100  LIPASE 24 23   No results for input(s): AMMONIA in the last 168 hours. CBC:  Recent Labs Lab 03/09/15 1248 03/11/15 1100  03/12/15 0525 03/12/15 1317 03/12/15 2205 03/13/15 0609 03/13/15 1201  WBC 5.8 5.6  --  4.5  --   --  4.4  --   NEUTROABS  --  3.7  --   --   --   --   --   --   HGB 10.2* 7.9*  < > 7.6* 8.1* 7.4* 7.8* 7.6*  HCT 30.3* 23.4*  < > 23.0* 23.4* 22.6* 23.9* 23.0*  MCV 103.1* 103.1*  --  101.3*  --    --  104.4*  --   PLT 221 203  --  168  --   --  168  --   < > = values in this interval not displayed. Cardiac Enzymes:  Recent Labs Lab 03/13/15 1201  TROPONINI <0.03   BNP (last 3 results) No results for input(s): BNP in the last 8760 hours.  ProBNP (last 3 results) No results for input(s): PROBNP in the last 8760 hours.  CBG: No results for input(s): GLUCAP in the last 168 hours.  Recent Results (from the past 240 hour(s))  Urine culture     Status: None   Collection Time: 03/09/15  4:03 PM  Result Value Ref Range Status   Specimen Description URINE, CATHETERIZED  Final   Special Requests NONE  Final   Culture   Final    >=100,000 COLONIES/mL ESCHERICHIA COLI Confirmed Extended Spectrum Beta-Lactamase Producer (ESBL) Performed at [facility]    Report Status 03/11/2015 FINAL  Final   Organism ID, Bacteria ESCHERICHIA COLI  Final      Susceptibility   Escherichia coli - MIC*    AMPICILLIN >=32 RESISTANT Resistant     CEFAZOLIN >=64 RESISTANT Resistant     CEFTRIAXONE 32 RESISTANT Resistant     CIPROFLOXACIN 0.5 SENSITIVE Sensitive     GENTAMICIN <=1 SENSITIVE Sensitive     IMIPENEM <=0.25 SENSITIVE Sensitive     NITROFURANTOIN <=16 SENSITIVE Sensitive     TRIMETH/SULFA >=320 RESISTANT Resistant     AMPICILLIN/SULBACTAM >=32 RESISTANT Resistant     PIP/TAZO 64 INTERMEDIATE Intermediate     * >=100,000 COLONIES/mL ESCHERICHIA COLI     Studies: No results found.  Scheduled Meds: . ciprofloxacin  500 mg Oral Q24H  . dorzolamide  1 drop Both Eyes TID  . fluticasone  2 spray Each Nare QHS  . latanoprost  1 drop Both Eyes QHS  . lidocaine  1 patch Transdermal Q24H  . pantoprazole (PROTONIX) IV  40 mg Intravenous Q12H   Continuous Infusions: . sodium chloride 50 mL/hr at 03/12/15 6256    Active Problems:   Diastolic CHF, chronic (HCC)   Coronary artery disease   Chronic atrial fibrillation (HCC)   CKD (chronic kidney disease) stage 3, GFR 30-59  ml/min   GI bleed   Melena   Pressure ulcer   Duodenal ulcer hemorrhage    Time spent: 25 min    [place] [facility] Pager [phone]. If 7PM-7AM, please contact night-coverage at [email or web address], password [facility] 03/13/2015, 3:18 PM  LOS: 2 days

## 2015-03-13 NOTE — Progress Notes (Signed)
PHARMACIST - PHYSICIAN COMMUNICATION DR:   Darrick Meigs CONCERNING: Antibiotic IV to Oral Route Change Policy  RECOMMENDATION: This patient is receiving ciprofloxacin by the intravenous route.  Based on criteria approved by the Pharmacy and Therapeutics Committee, the antibiotic(s) is/are being converted to the equivalent oral dose form(s).   DESCRIPTION: These criteria include:  Patient being treated for a respiratory tract infection, urinary tract infection, cellulitis or clostridium difficile associated diarrhea if on metronidazole  The patient is not neutropenic and does not exhibit a GI malabsorption state  The patient is eating (either orally or via tube) and/or has been taking other orally administered medications for a least 24 hours  The patient is improving clinically and has a Tmax < 100.5  If you have questions about this conversion, please contact the Pharmacy Department  []   [phone] )  [facility] []   [phone] )  [facility] []   [phone] )  [facility] []   [phone] )  [facility] [x]   [phone] )  [place], PharmD, BCPS 03/13/2015 12:15 PM

## 2015-03-13 NOTE — Progress Notes (Signed)
Called and spoke to the [facility] in [place] on phone, he says that his mother wants to be DNR. He called and discussed with Mrs Scherger and confirmed that she will be DNR.

## 2015-03-14 LAB — BASIC METABOLIC PANEL
Anion gap: 7 (ref 5–15)
BUN: 16 mg/dL (ref 6–20)
CALCIUM: 8.5 mg/dL — AB (ref 8.9–10.3)
CO2: 19 mmol/L — ABNORMAL LOW (ref 22–32)
CREATININE: 1.27 mg/dL — AB (ref 0.44–1.00)
Chloride: 117 mmol/L — ABNORMAL HIGH (ref 101–111)
GFR calc non Af Amer: 37 mL/min — ABNORMAL LOW (ref >60)
GFR, EST AFRICAN AMERICAN: 42 mL/min — AB (ref >60)
Glucose, Bld: 145 mg/dL — ABNORMAL HIGH (ref 65–99)
Potassium: 3.6 mmol/L (ref 3.5–5.1)
SODIUM: 143 mmol/L (ref 135–145)

## 2015-03-14 LAB — HEMOGLOBIN AND HEMATOCRIT, BLOOD
HEMATOCRIT: 22.6 % — AB (ref 36.0–46.0)
HEMOGLOBIN: 7.5 g/dL — AB (ref 12.0–15.0)

## 2015-03-14 LAB — TROPONIN I: Troponin I: 0.03 ng/mL (ref <0.031)

## 2015-03-14 MED ORDER — DOCUSATE SODIUM 100 MG PO CAPS
200.0000 mg | ORAL_CAPSULE | Freq: Once | ORAL | Status: AC
  Administered 2015-03-14: 200 mg via ORAL
  Filled 2015-03-14: qty 2

## 2015-03-14 NOTE — Progress Notes (Addendum)
[facility] PROGRESS NOTE  Maria Christian LTJ:[id number] DOB: September 23, 1924 DOA: 03/11/2015 PCP: No primary care provider on file.  Assessment/Plan:  Bleeding ulcer in the duodenal bulb EGD showed large bleeding ulcer in the duodenal bulb Started  on Protonix. Continue to hold aspirin and Plavix.  Bradycardia Telemetry showed intermittent episodes of bradycardia, Coreg on hold. Continue to monitor on telemetry  UTI Recent urine culture grew Escherichia coli sensitive to ciprofloxacin. We'll continue IV Cipro 400 every 12 hours.  Sacral ulcer Patient has stage II decubitus ulcer in the sacral area. Wound care following.  Anemia Patient hemoglobin dropped to 6.8 on 03/12/2015  and required one unit PRBC and this morning the hemoglobin is 7.5. Will check H&H every 6 hours and transfuse if hemoglobin drops less than 7.  Chronic diastolic CHF Patient has history of diastolic CHF. Currently compensated Lasix is on hold. Will discontinue IV fluids.  Hypertension Blood pressures are controlled.  Will hold amlodipine and Coreg at this time.  Atrial fibrillation EKG shows A. fib on rate control.  Will hold aspirin and Plavix at this time due to the upper GI bleed.  Code Status: Full code Family Communication: Discussed with patient's son Maria Christian on phone on 03/13/2015, and he confirmed the status of DO NOT RESUSCITATE. Disposition Plan: Skilled nursing facility in next 24 hours   Consultants:  GI  Procedures:  None  Antibiotics:  None  HPI/Subjective: 81 year old female who  was brought to the hospital from skilled facility after she was noted to have dark-colored stool. Patient was recently seen in the ED and was diagnosed with UTI and was discharged on ciprofloxacin. Patient says that she has been having abdominal discomfort, nausea and intermittent vomiting. This morning she was noted to have dark colored stool which looks like blood. She denies chest pain,  no shortness of breath. Denies nausea and vomiting today. In the ED she was found to have hemoglobin of 7.9 which is down from 10.2 on 03/09/2015. The urine culture done on 03/09/2015 the Escherichia coli sensitive to ciprofloxacin.  This morning patient feels better, denies any complaints.  Objective: Filed Vitals:   03/14/15 0553  BP: 123/66  Pulse: 67  Temp: 97.9 F (36.6 C)  Resp: 18    Intake/Output Summary (Last 24 hours) at 03/14/15 1300 Last data filed at 03/14/15 0000  Gross per 24 hour  Intake 265.83 ml  Output      0 ml  Net 265.83 ml   Filed Weights   03/11/15 1306  Weight: 65 kg (143 lb 4.8 oz)    Exam:   General:  Appears in no acute distress  Cardiovascular: S1S2 irregular  Respiratory: Clear bilaterally  Abdomen: soft, nontender, no organomegaly  Musculoskeletal: No cyanosis/clubbing or edema of the lower extremities   Data Reviewed: Basic Metabolic Panel:  Recent Labs Lab 03/09/15 1248 03/11/15 1100 03/12/15 0525 03/13/15 0609 03/13/15 1814 03/14/15 0548  NA 139 138 141 143  --  143  K 3.2* 3.6 3.0* 3.5  --  3.6  CL 111 108 114* 116*  --  117*  CO2 22 20* 21* 20*  --  19*  GLUCOSE 137* 180* 138* 164*  --  145*  BUN 14 51* 40* 24*  --  16  CREATININE 1.07* 1.76* 1.54* 1.37*  --  1.27*  CALCIUM 9.2 9.0 8.6* 8.5*  --  8.5*  MG  --   --   --   --  1.9  --  Liver Function Tests:  Recent Labs Lab 03/09/15 1248 03/11/15 1100 03/12/15 0525  AST 18 16 12*  ALT 9* 7* 6*  ALKPHOS 72 60 49  BILITOT 0.6 0.3 0.6  PROT 7.4 6.5 5.6*  ALBUMIN 3.7 3.3* 2.8*    Recent Labs Lab 03/09/15 1248 03/11/15 1100  LIPASE 24 23   No results for input(s): AMMONIA in the last 168 hours. CBC:  Recent Labs Lab 03/09/15 1248 03/11/15 1100  03/12/15 0525  03/12/15 2205 03/13/15 0609 03/13/15 1201 03/13/15 1808 03/14/15 0548  WBC 5.8 5.6  --  4.5  --   --  4.4  --   --   --   NEUTROABS  --  3.7  --   --   --   --   --   --   --   --    HGB 10.2* 7.9*  < > 7.6*  < > 7.4* 7.8* 7.6* 7.5* 7.5*  HCT 30.3* 23.4*  < > 23.0*  < > 22.6* 23.9* 23.0* 22.7* 22.6*  MCV 103.1* 103.1*  --  101.3*  --   --  104.4*  --   --   --   PLT 221 203  --  168  --   --  168  --   --   --   < > = values in this interval not displayed. Cardiac Enzymes:  Recent Labs Lab 03/13/15 1201 03/13/15 1809 03/14/15 0015  TROPONINI <0.03 0.03 0.03   BNP (last 3 results) No results for input(s): BNP in the last 8760 hours.  ProBNP (last 3 results) No results for input(s): PROBNP in the last 8760 hours.  CBG: No results for input(s): GLUCAP in the last 168 hours.  Recent Results (from the past 240 hour(s))  Urine culture     Status: None   Collection Time: 03/09/15  4:03 PM  Result Value Ref Range Status   Specimen Description URINE, CATHETERIZED  Final   Special Requests NONE  Final   Culture   Final    >=100,000 COLONIES/mL ESCHERICHIA COLI Confirmed Extended Spectrum Beta-Lactamase Producer (ESBL) Performed at [facility]    Report Status 03/11/2015 FINAL  Final   Organism ID, Bacteria ESCHERICHIA COLI  Final      Susceptibility   Escherichia coli - MIC*    AMPICILLIN >=32 RESISTANT Resistant     CEFAZOLIN >=64 RESISTANT Resistant     CEFTRIAXONE 32 RESISTANT Resistant     CIPROFLOXACIN 0.5 SENSITIVE Sensitive     GENTAMICIN <=1 SENSITIVE Sensitive     IMIPENEM <=0.25 SENSITIVE Sensitive     NITROFURANTOIN <=16 SENSITIVE Sensitive     TRIMETH/SULFA >=320 RESISTANT Resistant     AMPICILLIN/SULBACTAM >=32 RESISTANT Resistant     PIP/TAZO 64 INTERMEDIATE Intermediate     * >=100,000 COLONIES/mL ESCHERICHIA COLI     Studies: No results found.  Scheduled Meds: . ciprofloxacin  500 mg Oral Q24H  . dorzolamide  1 drop Both Eyes TID  . fluticasone  2 spray Each Nare QHS  . latanoprost  1 drop Both Eyes QHS  . lidocaine  1 patch Transdermal Q24H  . pantoprazole (PROTONIX) IV  40 mg Intravenous Q12H   Continuous  Infusions: . sodium chloride 0.9 % 1,000 mL with potassium chloride 20 mEq infusion 50 mL/hr at 03/13/15 2105    Active Problems:   Diastolic CHF, chronic (HCC)   Coronary artery disease   Chronic atrial fibrillation (HCC)   CKD (chronic kidney disease) stage  3, GFR 30-59 ml/min   GI bleed   Melena   Pressure ulcer   Duodenal ulcer hemorrhage    Time spent: 25 min    [facility] Pager [phone]. If 7PM-7AM, please contact night-coverage at [email or web address], password [facility] 03/14/2015, 1:00 PM  LOS: 3 days

## 2015-03-15 ENCOUNTER — Encounter (HOSPITAL_COMMUNITY): Payer: Self-pay | Admitting: Gastroenterology

## 2015-03-15 DIAGNOSIS — D638 Anemia in other chronic diseases classified elsewhere: Secondary | ICD-10-CM | POA: Diagnosis not present

## 2015-03-15 DIAGNOSIS — K5901 Slow transit constipation: Secondary | ICD-10-CM | POA: Diagnosis not present

## 2015-03-15 DIAGNOSIS — E876 Hypokalemia: Secondary | ICD-10-CM | POA: Diagnosis not present

## 2015-03-15 DIAGNOSIS — E1140 Type 2 diabetes mellitus with diabetic neuropathy, unspecified: Secondary | ICD-10-CM | POA: Diagnosis not present

## 2015-03-15 DIAGNOSIS — J309 Allergic rhinitis, unspecified: Secondary | ICD-10-CM | POA: Diagnosis not present

## 2015-03-15 DIAGNOSIS — Z1612 Extended spectrum beta lactamase (ESBL) resistance: Secondary | ICD-10-CM | POA: Diagnosis not present

## 2015-03-15 DIAGNOSIS — N183 Chronic kidney disease, stage 3 (moderate): Secondary | ICD-10-CM | POA: Diagnosis not present

## 2015-03-15 DIAGNOSIS — M179 Osteoarthritis of knee, unspecified: Secondary | ICD-10-CM | POA: Diagnosis not present

## 2015-03-15 DIAGNOSIS — B351 Tinea unguium: Secondary | ICD-10-CM | POA: Diagnosis not present

## 2015-03-15 DIAGNOSIS — H01001 Unspecified blepharitis right upper eyelid: Secondary | ICD-10-CM | POA: Diagnosis not present

## 2015-03-15 DIAGNOSIS — K598 Other specified functional intestinal disorders: Secondary | ICD-10-CM | POA: Diagnosis not present

## 2015-03-15 DIAGNOSIS — H01004 Unspecified blepharitis left upper eyelid: Secondary | ICD-10-CM | POA: Diagnosis not present

## 2015-03-15 DIAGNOSIS — J449 Chronic obstructive pulmonary disease, unspecified: Secondary | ICD-10-CM | POA: Diagnosis not present

## 2015-03-15 DIAGNOSIS — E119 Type 2 diabetes mellitus without complications: Secondary | ICD-10-CM | POA: Diagnosis not present

## 2015-03-15 DIAGNOSIS — G4700 Insomnia, unspecified: Secondary | ICD-10-CM | POA: Diagnosis not present

## 2015-03-15 DIAGNOSIS — M79671 Pain in right foot: Secondary | ICD-10-CM | POA: Diagnosis not present

## 2015-03-15 DIAGNOSIS — J9610 Chronic respiratory failure, unspecified whether with hypoxia or hypercapnia: Secondary | ICD-10-CM | POA: Diagnosis not present

## 2015-03-15 DIAGNOSIS — Z79899 Other long term (current) drug therapy: Secondary | ICD-10-CM | POA: Diagnosis not present

## 2015-03-15 DIAGNOSIS — L89152 Pressure ulcer of sacral region, stage 2: Secondary | ICD-10-CM | POA: Diagnosis not present

## 2015-03-15 DIAGNOSIS — F419 Anxiety disorder, unspecified: Secondary | ICD-10-CM | POA: Diagnosis not present

## 2015-03-15 DIAGNOSIS — E785 Hyperlipidemia, unspecified: Secondary | ICD-10-CM | POA: Diagnosis not present

## 2015-03-15 DIAGNOSIS — I2510 Atherosclerotic heart disease of native coronary artery without angina pectoris: Secondary | ICD-10-CM | POA: Diagnosis not present

## 2015-03-15 DIAGNOSIS — F0390 Unspecified dementia without behavioral disturbance: Secondary | ICD-10-CM | POA: Diagnosis not present

## 2015-03-15 DIAGNOSIS — D599 Acquired hemolytic anemia, unspecified: Secondary | ICD-10-CM | POA: Diagnosis not present

## 2015-03-15 DIAGNOSIS — I5041 Acute combined systolic (congestive) and diastolic (congestive) heart failure: Secondary | ICD-10-CM | POA: Diagnosis not present

## 2015-03-15 DIAGNOSIS — F339 Major depressive disorder, recurrent, unspecified: Secondary | ICD-10-CM | POA: Diagnosis not present

## 2015-03-15 DIAGNOSIS — I5032 Chronic diastolic (congestive) heart failure: Secondary | ICD-10-CM | POA: Diagnosis not present

## 2015-03-15 DIAGNOSIS — A498 Other bacterial infections of unspecified site: Secondary | ICD-10-CM | POA: Diagnosis not present

## 2015-03-15 DIAGNOSIS — I25119 Atherosclerotic heart disease of native coronary artery with unspecified angina pectoris: Secondary | ICD-10-CM | POA: Diagnosis not present

## 2015-03-15 DIAGNOSIS — B372 Candidiasis of skin and nail: Secondary | ICD-10-CM | POA: Diagnosis not present

## 2015-03-15 DIAGNOSIS — N390 Urinary tract infection, site not specified: Secondary | ICD-10-CM | POA: Diagnosis not present

## 2015-03-15 DIAGNOSIS — I25110 Atherosclerotic heart disease of native coronary artery with unstable angina pectoris: Secondary | ICD-10-CM | POA: Diagnosis not present

## 2015-03-15 DIAGNOSIS — K269 Duodenal ulcer, unspecified as acute or chronic, without hemorrhage or perforation: Secondary | ICD-10-CM | POA: Diagnosis not present

## 2015-03-15 DIAGNOSIS — R600 Localized edema: Secondary | ICD-10-CM | POA: Diagnosis not present

## 2015-03-15 DIAGNOSIS — I4891 Unspecified atrial fibrillation: Secondary | ICD-10-CM | POA: Diagnosis not present

## 2015-03-15 DIAGNOSIS — K5909 Other constipation: Secondary | ICD-10-CM | POA: Diagnosis not present

## 2015-03-15 DIAGNOSIS — M25571 Pain in right ankle and joints of right foot: Secondary | ICD-10-CM | POA: Diagnosis not present

## 2015-03-15 DIAGNOSIS — R1084 Generalized abdominal pain: Secondary | ICD-10-CM | POA: Diagnosis not present

## 2015-03-15 DIAGNOSIS — I10 Essential (primary) hypertension: Secondary | ICD-10-CM | POA: Diagnosis not present

## 2015-03-15 DIAGNOSIS — D649 Anemia, unspecified: Secondary | ICD-10-CM | POA: Diagnosis not present

## 2015-03-15 DIAGNOSIS — M545 Low back pain: Secondary | ICD-10-CM | POA: Diagnosis not present

## 2015-03-15 DIAGNOSIS — G8929 Other chronic pain: Secondary | ICD-10-CM | POA: Diagnosis not present

## 2015-03-15 DIAGNOSIS — F411 Generalized anxiety disorder: Secondary | ICD-10-CM | POA: Diagnosis not present

## 2015-03-15 DIAGNOSIS — K260 Acute duodenal ulcer with hemorrhage: Secondary | ICD-10-CM | POA: Diagnosis not present

## 2015-03-15 DIAGNOSIS — E46 Unspecified protein-calorie malnutrition: Secondary | ICD-10-CM | POA: Diagnosis not present

## 2015-03-15 DIAGNOSIS — I482 Chronic atrial fibrillation: Secondary | ICD-10-CM | POA: Diagnosis not present

## 2015-03-15 DIAGNOSIS — G218 Other secondary parkinsonism: Secondary | ICD-10-CM | POA: Diagnosis not present

## 2015-03-15 DIAGNOSIS — K921 Melena: Secondary | ICD-10-CM | POA: Diagnosis not present

## 2015-03-15 DIAGNOSIS — F329 Major depressive disorder, single episode, unspecified: Secondary | ICD-10-CM | POA: Diagnosis not present

## 2015-03-15 DIAGNOSIS — D696 Thrombocytopenia, unspecified: Secondary | ICD-10-CM | POA: Diagnosis not present

## 2015-03-15 DIAGNOSIS — M6281 Muscle weakness (generalized): Secondary | ICD-10-CM | POA: Diagnosis not present

## 2015-03-15 DIAGNOSIS — R627 Adult failure to thrive: Secondary | ICD-10-CM | POA: Diagnosis not present

## 2015-03-15 DIAGNOSIS — F0391 Unspecified dementia with behavioral disturbance: Secondary | ICD-10-CM | POA: Diagnosis not present

## 2015-03-15 DIAGNOSIS — H04123 Dry eye syndrome of bilateral lacrimal glands: Secondary | ICD-10-CM | POA: Diagnosis not present

## 2015-03-15 DIAGNOSIS — M79604 Pain in right leg: Secondary | ICD-10-CM | POA: Diagnosis not present

## 2015-03-15 DIAGNOSIS — H401134 Primary open-angle glaucoma, bilateral, indeterminate stage: Secondary | ICD-10-CM | POA: Diagnosis not present

## 2015-03-15 DIAGNOSIS — S30810A Abrasion of lower back and pelvis, initial encounter: Secondary | ICD-10-CM | POA: Diagnosis not present

## 2015-03-15 DIAGNOSIS — D509 Iron deficiency anemia, unspecified: Secondary | ICD-10-CM | POA: Diagnosis not present

## 2015-03-15 DIAGNOSIS — M150 Primary generalized (osteo)arthritis: Secondary | ICD-10-CM | POA: Diagnosis not present

## 2015-03-15 DIAGNOSIS — K264 Chronic or unspecified duodenal ulcer with hemorrhage: Secondary | ICD-10-CM | POA: Diagnosis not present

## 2015-03-15 LAB — CBC
HCT: 25.1 % — ABNORMAL LOW (ref 36.0–46.0)
Hemoglobin: 8.1 g/dL — ABNORMAL LOW (ref 12.0–15.0)
MCH: 34 pg (ref 26.0–34.0)
MCHC: 32.3 g/dL (ref 30.0–36.0)
MCV: 105.5 fL — ABNORMAL HIGH (ref 78.0–100.0)
PLATELETS: 171 10*3/uL (ref 150–400)
RBC: 2.38 MIL/uL — ABNORMAL LOW (ref 3.87–5.11)
RDW: 16.1 % — AB (ref 11.5–15.5)
WBC: 5.4 10*3/uL (ref 4.0–10.5)

## 2015-03-15 LAB — TYPE AND SCREEN
ABO/RH(D): O POS
ANTIBODY SCREEN: NEGATIVE
Unit division: 0
Unit division: 0

## 2015-03-15 LAB — BASIC METABOLIC PANEL
ANION GAP: 5 (ref 5–15)
BUN: 13 mg/dL (ref 6–20)
CALCIUM: 8.3 mg/dL — AB (ref 8.9–10.3)
CO2: 20 mmol/L — ABNORMAL LOW (ref 22–32)
CREATININE: 0.96 mg/dL (ref 0.44–1.00)
Chloride: 116 mmol/L — ABNORMAL HIGH (ref 101–111)
GFR calc Af Amer: 59 mL/min — ABNORMAL LOW (ref >60)
GFR, EST NON AFRICAN AMERICAN: 51 mL/min — AB (ref >60)
GLUCOSE: 144 mg/dL — AB (ref 65–99)
Potassium: 3.2 mmol/L — ABNORMAL LOW (ref 3.5–5.1)
Sodium: 141 mmol/L (ref 135–145)

## 2015-03-15 MED ORDER — VITAMINS A & D EX OINT
TOPICAL_OINTMENT | CUTANEOUS | Status: AC
  Administered 2015-03-15: 1
  Filled 2015-03-15: qty 5

## 2015-03-15 MED ORDER — ASPIRIN EC 81 MG PO TBEC: 81.0000 mg | DELAYED_RELEASE_TABLET | Freq: Every day | ORAL | Status: DC

## 2015-03-15 MED ORDER — CIPROFLOXACIN HCL 500 MG PO TABS: 500.0000 mg | ORAL_TABLET | ORAL | Status: DC

## 2015-03-15 MED ORDER — POTASSIUM CHLORIDE CRYS ER 20 MEQ PO TBCR
40.0000 meq | EXTENDED_RELEASE_TABLET | Freq: Once | ORAL | Status: AC
  Administered 2015-03-15: 40 meq via ORAL
  Filled 2015-03-15: qty 2

## 2015-03-15 MED ORDER — CIPROFLOXACIN HCL 500 MG PO TABS
500.0000 mg | ORAL_TABLET | Freq: Two times a day (BID) | ORAL | Status: DC
  Filled 2015-03-15 (×3): qty 1

## 2015-03-15 MED ORDER — POTASSIUM CHLORIDE 10 MEQ/100ML IV SOLN
10.0000 meq | Freq: Once | INTRAVENOUS | Status: AC
  Administered 2015-03-15: 10 meq via INTRAVENOUS
  Filled 2015-03-15: qty 100

## 2015-03-15 MED ORDER — CIPROFLOXACIN HCL 500 MG PO TABS
500.0000 mg | ORAL_TABLET | ORAL | Status: DC
Start: 2015-03-15 — End: 2015-03-15
  Administered 2015-03-15: 500 mg via ORAL
  Filled 2015-03-15: qty 1

## 2015-03-15 MED ORDER — PANTOPRAZOLE SODIUM 40 MG PO TBEC: 40.0000 mg | DELAYED_RELEASE_TABLET | Freq: Two times a day (BID) | ORAL | Status: DC

## 2015-03-15 MED ORDER — HYDROCODONE-ACETAMINOPHEN 5-325 MG PO TABS: 1.0000 | ORAL_TABLET | Freq: Four times a day (QID) | ORAL | Status: DC | PRN

## 2015-03-15 NOTE — Discharge Summary (Signed)
Physician Discharge Summary  ROBENA EWY FXT:[id number] DOB: [birth year] DOA: 03/11/2015  PCP: No primary care provider on file.  Admit date: 03/11/2015 Discharge date: 03/15/2015  Time spent: 35* minutes  Recommendations for Outpatient Follow-up:   1. Check BMP in 3 days for low potassium 2. Start Aspirin EC 81 mg po daily from 03/29/15 3. Protonix 40 mg po twice daily for one month,  then 40 mg po daily  Discharge Diagnoses:  Active Problems:   Diastolic CHF, chronic (HCC)   Coronary artery disease   Chronic atrial fibrillation (HCC)   CKD (chronic kidney disease) stage 3, GFR 30-59 ml/min   GI bleed   Melena   Pressure ulcer   Duodenal ulcer hemorrhage   Discharge Condition: Stable  Diet recommendation: Low salt diet  Filed Weights   03/11/15 1306  Weight: 65 kg (143 lb 4.8 oz)    History of present illness:  81 year old female who was brought to the hospital from skilled facility after she was noted to have dark-colored stool. Patient was recently seen in the ED and was diagnosed with UTI and was discharged on ciprofloxacin. Patient says that she has been having abdominal discomfort, nausea and intermittent vomiting. This morning she was noted to have dark colored stool which looks like blood. She denies chest pain, no shortness of breath. Denies nausea and vomiting today. In the ED she was found to have hemoglobin of 7.9 which is down from 10.2 on 03/09/2015. The urine culture done on 03/09/2015 the Escherichia coli sensitive to ciprofloxacin.  Hospital Course:   Bleeding ulcer in the duodenal bulb EGD showed large bleeding ulcer in the duodenal bulb Started on Protonix. Plavix discontinued. Start Aspirin EC 81 mg po daily from 03/29/15 Will follow up [facility] as outpatient.  Bradycardia Telemetry showed intermittent episodes of bradycardia, Coreg was held, Now resolved, restart Coreg 3.125 mg po twice daily  UTI Recent urine culture grew Escherichia coli  sensitive to ciprofloxacin. Started on IV Cipro 400 every 12 hours. Will need two more days of Cipro 500 mg po daily x 2 more days, stop after 03/16/15  Sacral ulcer Patient has stage II decubitus ulcer in the sacral area. Wound care followed the patient in the hospital.  Anemia Patient hemoglobin dropped to 6.8 on 03/12/2015 and required one unit PRBC and this morning the hemoglobin is 8.1  Chronic diastolic CHF Patient has history of diastolic CHF. Currently compensated Will restart the lasix  Hypokalemia Today potassium is 3.2, will replace potassium before discharge. Check BMP in 3 days.  Hypertension Blood pressures are controlled. Continue  amlodipine and Coreg at this time.  Procedures:  EGD  Consultations:  GI  Discharge Exam: Filed Vitals:   03/15/15 0526  BP: 140/57  Pulse: 86  Temp: 98.3 F (36.8 C)  Resp: 18    General: Appears in no acute distress Cardiovascular: S1S2 RRR Respiratory: Clear bilaterally  Discharge Instructions   Discharge Instructions    Diet - low sodium heart healthy    Complete by:  As directed      Discharge instructions    Complete by:  As directed   Start Aspirin EC 81 mg po daily from 03/29/15     Increase activity slowly    Complete by:  As directed           Current Discharge Medication List    START taking these medications   Details  aspirin EC 81 MG tablet Take 1 tablet (81 mg total) by  mouth daily. Start from 03/29/15 Qty: 10 tablet    pantoprazole (PROTONIX) 40 MG tablet Take 1 tablet (40 mg total) by mouth 2 (two) times daily. Protonix 40 mg po twice daily for one month then change to 40 mg po once daily Qty: 10 tablet, Refills: 0      CONTINUE these medications which have CHANGED   Details  ciprofloxacin (CIPRO) 500 MG tablet Take 1 tablet (500 mg total) by mouth daily. Qty: 2 tablet, Refills: 0    HYDROcodone-acetaminophen (NORCO/VICODIN) 5-325 MG tablet Take 1 tablet by mouth every 6 (six) hours  as needed for moderate pain. Qty: 10 tablet, Refills: 0      CONTINUE these medications which have NOT CHANGED   Details  acetaminophen (TYLENOL) 325 MG tablet Take 2 tablets (650 mg total) by mouth every 6 (six) hours as needed for mild pain. Qty: 120 tablet, Refills: 11   Associated Diagnoses: Chronic pain    amLODipine (NORVASC) 2.5 MG tablet Take 1 tablet (2.5 mg total) by mouth daily.    baclofen (LIORESAL) 10 MG tablet Take 5 mg by mouth at bedtime.    bisacodyl (DULCOLAX) 10 MG suppository Place 10 mg rectally as needed for mild constipation or moderate constipation.    carvedilol (COREG) 3.125 MG tablet Take 3.125 mg by mouth daily.    cholecalciferol (VITAMIN D) 1000 UNITS tablet Take 1,000 Units by mouth daily.    Cranberry 475 MG CAPS Take 1 capsule by mouth 2 (two) times daily.    docusate sodium (COLACE) 100 MG capsule Take 200 mg by mouth daily.    dorzolamide (TRUSOPT) 2 % ophthalmic solution Place 1 drop into both eyes 3 (three) times daily.     escitalopram (LEXAPRO) 10 MG tablet Take 1 tablet (10 mg total) by mouth daily. Qty: 90 tablet, Refills: 11   Associated Diagnoses: Depression    ferrous sulfate 325 (65 FE) MG tablet Take 325 mg by mouth daily with breakfast.    fluticasone (FLONASE) 50 MCG/ACT nasal spray Place 2 sprays into both nostrils at bedtime.    furosemide (LASIX) 40 MG tablet Take 1 tablet (40 mg total) by mouth daily. Qty: 30 tablet    lidocaine (LIDODERM) 5 % Place 1 patch onto the skin daily. Apply 1 patch to left lower back/hip every morning.  Remove & Discard patch within 12 hours or as directed by MD Qty: 5 patch, Refills: 0    Linaclotide (LINZESS) 145 MCG CAPS capsule Take 145 mcg by mouth daily.    loratadine (CLARITIN) 10 MG tablet Take 10 mg by mouth daily.    Multiple Vitamins-Minerals (CERTAGEN PO) Take 1 tablet by mouth daily.     ondansetron (ZOFRAN) 8 MG tablet Take 8 mg by mouth every 8 (eight) hours as needed for  nausea or vomiting.    Polyethyl Glycol-Propyl Glycol (SYSTANE) 0.4-0.3 % SOLN Place 1 drop into both eyes 4 (four) times daily.     potassium chloride SA (K-DUR,KLOR-CON) 20 MEQ tablet Take 20 mEq by mouth daily.    senna (SENOKOT) 8.6 MG TABS tablet Take 1 tablet (8.6 mg total) by mouth 2 (two) times daily. Qty: 120 each, Refills: 0    sodium chloride (OCEAN) 0.65 % SOLN nasal spray Place 1 spray into both nostrils as directed. Four times daily And every 24 hours as needed to moisturize nasal passages.    Travoprost, BAK Free, (TRAVATAN) 0.004 % SOLN ophthalmic solution Place 1 drop into both eyes at bedtime.  traZODone (DESYREL) 50 MG tablet Take 0.5 tablets (25 mg total) by mouth at bedtime as needed for sleep. Qty: 90 tablet, Refills: 11   Associated Diagnoses: Dementia, without behavioral disturbance    vitamin B-12 (CYANOCOBALAMIN) 500 MCG tablet Take 500 mcg by mouth daily.      STOP taking these medications     aspirin 81 MG chewable tablet      clopidogrel (PLAVIX) 75 MG tablet      diclofenac sodium (VOLTAREN) 1 % GEL      vitamin C (ASCORBIC ACID) 500 MG tablet        Allergies  Allergen Reactions  . Aspirin Other (See Comments)    G.I. Upset only   Follow-up Information    Follow up with [facility].   Specialty:  Gastroenterology   Why:  Office will contact you to set up follow up appt.   Contact information:   [address] [place] [address] [phone]       The results of significant diagnostics from this hospitalization (including imaging, microbiology, ancillary and laboratory) are listed below for reference.    Significant Diagnostic Studies: Ct Abdomen Pelvis Wo Contrast  02/26/2015   CLINICAL DATA:  Acute generalized abdominal pain for 2 weeks.  EXAM: CT ABDOMEN AND PELVIS WITHOUT CONTRAST  TECHNIQUE: Multidetector CT imaging of the abdomen and pelvis was performed following the standard protocol  without IV contrast.  COMPARISON:  CT scan of December 27, 2013.  FINDINGS: Visualized lung bases appear normal. Status post left hip arthroplasty.  Status post cholecystectomy. No focal abnormality is noted in the liver, spleen or pancreas is noted on these unenhanced images. Atherosclerosis of abdominal aorta is noted without aneurysm formation. Horseshoe configuration of the kidneys is noted. Adrenal glands appear normal. No hydronephrosis or renal obstruction is noted. No renal or ureteral calculi are noted. Mildly dilated sigmoid colon is noted which appears to be due to ileus. No volvulus is noted. No abnormal fluid collection is noted. Urinary bladder appears normal. Status post hysterectomy. Ovaries appear normal. No significant adenopathy is noted.  IMPRESSION: Atherosclerosis of abdominal aorta is noted without aneurysm formation.  Horseshoe configuration of the kidneys is noted. No hydronephrosis or renal obstruction is noted. No renal or ureteral calculi are noted.  Dilated and air-filled sigmoid colon is noted most consistent with ileus. There is no evidence of volvulus or obstruction.   Electronically Signed   By: Marijo Conception, M.D.   On: 02/26/2015 14:54   Dg Ribs Unilateral Left  02/27/2015   CLINICAL DATA:  Left lateral rib pain for 2 days.  EXAM: LEFT RIBS - 2 VIEW  COMPARISON:  01/05/2014  FINDINGS: No visible rib fractures. No pneumothorax or effusion. Visualized left lung clear. Cardiomegaly.  IMPRESSION: No visible rib fracture.   Electronically Signed   By: Rolm Baptise M.D.   On: 02/27/2015 09:36   Dg Abd 1 View  03/11/2015   CLINICAL DATA:  81 year old female with abdominal pain nausea vomiting, abdominal distention. Initial encounter.  EXAM: ABDOMEN - 1 VIEW  COMPARISON:  CT Abdomen and Pelvis 03/09/2015, and earlier  FINDINGS: Supine view. Continued Stable gas pattern compared to the recent CT and radiographs with gaseous distension of redundant sigmoid colon. No upstream dilated  bowel loops. No definite pneumoperitoneum. Stable cholecystectomy clips. Stable visualized osseous structures. Calcified aortic and iliac artery atherosclerosis.  IMPRESSION: Stable bowel gas pattern over this series of exams with continued gaseous distension of the redundant sigmoid  colon. Favor chronic decreased colonic motility.   Electronically Signed   By: Genevie Ann M.D.   On: 03/11/2015 11:44   Dg Abd 1 View  02/26/2015   CLINICAL DATA:  Initial encounter for 2 week history of abdominal pain and distention.  EXAM: ABDOMEN - 1 VIEW  COMPARISON:  12/27/2013.  FINDINGS: Supine view the abdomen shows diffuse gaseous distention of bowel, mainly representing colon. Imaging features are similar to prior study. Surgical clips in right upper quadrant compatible with prior cholecystectomy. Bones are diffusely demineralized.  IMPRESSION: Diffuse gaseous bowel distention, mainly involving colon. Imaging features may be related to chronic dysmotility, ileus, or evolving distal colonic obstruction.   Electronically Signed   By: Misty Stanley M.D.   On: 02/26/2015 13:44   Ct Head Wo Contrast  02/26/2015   CLINICAL DATA:  Headache x2 weeks, dementia  EXAM: CT HEAD WITHOUT CONTRAST  TECHNIQUE: Contiguous axial images were obtained from the base of the skull through the vertex without intravenous contrast.  COMPARISON:  MRI brain dated 01/12/2012  FINDINGS: No evidence of parenchymal hemorrhage or extra-axial fluid collection. No mass lesion, mass effect, or midline shift.  No CT evidence of acute infarction. Old right basal ganglia lacunar infarct (series 2/image 14).  Subcortical white matter and periventricular small vessel ischemic changes. Intracranial atherosclerosis.  Global cortical atrophy, likely age appropriate. No ventriculomegaly.  The visualized paranasal sinuses are essentially clear. The mastoid air cells are unopacified.  No evidence of calvarial fracture.  IMPRESSION: No evidence of acute intracranial  abnormality.  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  Small vessel ischemic changes.   Electronically Signed   By: Julian Hy M.D.   On: 02/26/2015 13:40   Ct Abdomen Pelvis W Contrast  03/09/2015   CLINICAL DATA:  Mid abdomen pain, constipation since yesterday.  EXAM: CT ABDOMEN AND PELVIS WITH CONTRAST  TECHNIQUE: Multidetector CT imaging of the abdomen and pelvis was performed using the standard protocol following bolus administration of intravenous contrast.  CONTRAST:  129mL OMNIPAQUE IOHEXOL 300 MG/ML  SOLN  COMPARISON:  February 26, 2015, Oct 09, 2013, July 24, 2013  FINDINGS: The liver, spleen, adrenal glands are normal. Horseshoe kidney is identified unchanged. Patient is status post prior cholecystectomy. There is a low-density mass in the body of pancreas measuring 2.4 x 1.5 cm unchanged compared to prior CT of July 24, 2013. Atherosclerosis of the abdominal aorta is identified without aneurysmal dilatation. There is no abdominal lymphadenopathy.  There is no small bowel obstruction or diverticulitis. The appendix is not seen but no inflammation is noted around the cecum. Mild bowel content is identified in the colon.  Fluid-filled bladder is normal. Pelvic phleboliths are identified. Left hip replacement is noted. There is mild atelectasis or scar of the left lung base. There is no focal pneumonia or pleural effusion. The heart size is enlarged. There is a small pericardial effusion. Images of the bones demonstrate focal sclerosis of the superior endplate of L4 unchanged since February 2015. There is lucency in the L2 vertebral body unchanged since February 2015.  IMPRESSION: There is no small bowel obstruction or diverticulitis. Mild bowel content is identified in the colon.  Low-density mass in the body of pancreas measuring 2.4 x 1.5 cm unchanged compared to prior CT of July 24, 2013. If clinically indicated, consider further evaluation with MRI of pancreas.   Electronically Signed    By: Abelardo Diesel M.D.   On: 03/09/2015 14:57   Dg Abd Portable 1v  02/27/2015   CLINICAL DATA:  Abdominal distension.  Follow up ileus.  EXAM: PORTABLE ABDOMEN - 1 VIEW  COMPARISON:  CT abdomen and pelvis and abdominal radiograph 02/26/2015  FINDINGS: Moderate gaseous distension of the sigmoid colon is stable to slightly improved compared to yesterday's radiograph. There is a moderate amount of stool in the more proximal colon. No definite small bowel dilatation is seen. Right upper quadrant surgical clips are noted. Left hip arthroplasty is partially visualized. Bones are osteopenic.  IMPRESSION: Stable to slightly improved gaseous distension of the sigmoid colon which may reflect ileus.   Electronically Signed   By: Logan Bores M.D.   On: 02/27/2015 10:21    Microbiology: Recent Results (from the past 240 hour(s))  Urine culture     Status: None   Collection Time: 03/09/15  4:03 PM  Result Value Ref Range Status   Specimen Description URINE, CATHETERIZED  Final   Special Requests NONE  Final   Culture   Final    >=100,000 COLONIES/mL ESCHERICHIA COLI Confirmed Extended Spectrum Beta-Lactamase Producer (ESBL) Performed at [facility]    Report Status 03/11/2015 FINAL  Final   Organism ID, Bacteria ESCHERICHIA COLI  Final      Susceptibility   Escherichia coli - MIC*    AMPICILLIN >=32 RESISTANT Resistant     CEFAZOLIN >=64 RESISTANT Resistant     CEFTRIAXONE 32 RESISTANT Resistant     CIPROFLOXACIN 0.5 SENSITIVE Sensitive     GENTAMICIN <=1 SENSITIVE Sensitive     IMIPENEM <=0.25 SENSITIVE Sensitive     NITROFURANTOIN <=16 SENSITIVE Sensitive     TRIMETH/SULFA >=320 RESISTANT Resistant     AMPICILLIN/SULBACTAM >=32 RESISTANT Resistant     PIP/TAZO 64 INTERMEDIATE Intermediate     * >=100,000 COLONIES/mL ESCHERICHIA COLI     Labs: Basic Metabolic Panel:  Recent Labs Lab 03/11/15 1100 03/12/15 0525 03/13/15 0609 03/13/15 1814 03/14/15 0548 03/15/15 0522  NA  138 141 143  --  143 141  K 3.6 3.0* 3.5  --  3.6 3.2*  CL 108 114* 116*  --  117* 116*  CO2 20* 21* 20*  --  19* 20*  GLUCOSE 180* 138* 164*  --  145* 144*  BUN 51* 40* 24*  --  16 13  CREATININE 1.76* 1.54* 1.37*  --  1.27* 0.96  CALCIUM 9.0 8.6* 8.5*  --  8.5* 8.3*  MG  --   --   --  1.9  --   --    Liver Function Tests:  Recent Labs Lab 03/09/15 1248 03/11/15 1100 03/12/15 0525  AST 18 16 12*  ALT 9* 7* 6*  ALKPHOS 72 60 49  BILITOT 0.6 0.3 0.6  PROT 7.4 6.5 5.6*  ALBUMIN 3.7 3.3* 2.8*    Recent Labs Lab 03/09/15 1248 03/11/15 1100  LIPASE 24 23   No results for input(s): AMMONIA in the last 168 hours. CBC:  Recent Labs Lab 03/09/15 1248 03/11/15 1100  03/12/15 0525  03/13/15 0609 03/13/15 1201 03/13/15 1808 03/14/15 0548 03/15/15 0522  WBC 5.8 5.6  --  4.5  --  4.4  --   --   --  5.4  NEUTROABS  --  3.7  --   --   --   --   --   --   --   --   HGB 10.2* 7.9*  < > 7.6*  < > 7.8* 7.6* 7.5* 7.5* 8.1*  HCT 30.3* 23.4*  < > 23.0*  < >  23.9* 23.0* 22.7* 22.6* 25.1*  MCV 103.1* 103.1*  --  101.3*  --  104.4*  --   --   --  105.5*  PLT 221 203  --  168  --  168  --   --   --  171  < > = values in this interval not displayed. Cardiac Enzymes:  Recent Labs Lab 03/13/15 1201 03/13/15 1809 03/14/15 0015  TROPONINI <0.03 0.03 0.03   BNP:   Signed:  Marguita Venning S  [facility] 03/15/2015, 11:54 AM

## 2015-03-15 NOTE — Progress Notes (Signed)
CSW spoke with patient's son [name] regarding disposition for patient. Son was informed of possible discharge today back to [facility]. Son was appreciative of update provided by CSW, and would like to be informed when patient is transferring back to facility. CSW to continue to follow with disposition needs.   Lucius Conn, [facility] [place] [phone]

## 2015-03-15 NOTE — Progress Notes (Signed)
CS was informed by MD that patient is medically stable and ready for discharge. CSW contacted patient's son Maria Christian to inform of discharge on today. Son was appreciative of updated from [place] and stated that the patient would need transportation back to facility. CSW to arrange transportation via [facility] for patient to return back to [facility]. Facility informed of patient's return around 2:30pm. Number to call report is [phone]. No further CSW needs reported. CSW to sign off. Please consult if further CSW needs arise.   Lucius Conn, [place] Social Worker [place] [phone]

## 2015-03-15 NOTE — Clinical Documentation Improvement (Signed)
Internal Medicine  Can the diagnosis of Malnutrition be further specified?   Document Severity - Severe(third degree), Moderate (second degree), Mild (first degree)  Other condition  Unable to clinically determine  Supporting Information: per progress notes  "Stage 2 pressure injury to sacrum" 03/13/15:  "FTT/malnutrition-with an albumin of 2.8" Please update your documentation within the medical record to reflect your response to this query. Please exercise your independent, professional judgment when responding. A specific answer is not anticipated or expected.   Thank You, [facility] [place] [phone]

## 2015-03-15 NOTE — Progress Notes (Signed)
Gave report to [place], Therapist, sports at [facility]. Left message if she had additional questions.

## 2015-03-15 NOTE — Care Management Important Message (Signed)
Important Message  Patient Details IM Letter given to Rhonda/Case Manager to present to [place] Message  Patient Details  Name: Maria Christian MRN: [id number] Date of Birth: Jul 18, 1924   Medicare Important Message Given:  Yes-second notification given    Camillo Flaming 03/15/2015, 1:53 PM Name: [name] MRN: [id number] Date of Birth: [birth year]   Medicare Important Message Given:  Yes-second notification given    Camillo Flaming 03/15/2015, 1:52 PM

## 2015-03-16 ENCOUNTER — Encounter: Payer: Self-pay | Admitting: Adult Health

## 2015-03-16 ENCOUNTER — Non-Acute Institutional Stay (SKILLED_NURSING_FACILITY): Payer: Medicare Other | Admitting: Adult Health

## 2015-03-16 ENCOUNTER — Other Ambulatory Visit: Payer: Self-pay | Admitting: *Deleted

## 2015-03-16 DIAGNOSIS — J9610 Chronic respiratory failure, unspecified whether with hypoxia or hypercapnia: Secondary | ICD-10-CM

## 2015-03-16 DIAGNOSIS — K5909 Other constipation: Secondary | ICD-10-CM | POA: Diagnosis not present

## 2015-03-16 DIAGNOSIS — I5032 Chronic diastolic (congestive) heart failure: Secondary | ICD-10-CM

## 2015-03-16 DIAGNOSIS — F329 Major depressive disorder, single episode, unspecified: Secondary | ICD-10-CM

## 2015-03-16 DIAGNOSIS — J309 Allergic rhinitis, unspecified: Secondary | ICD-10-CM

## 2015-03-16 DIAGNOSIS — G8929 Other chronic pain: Secondary | ICD-10-CM

## 2015-03-16 DIAGNOSIS — I482 Chronic atrial fibrillation, unspecified: Secondary | ICD-10-CM

## 2015-03-16 DIAGNOSIS — D509 Iron deficiency anemia, unspecified: Secondary | ICD-10-CM

## 2015-03-16 DIAGNOSIS — M150 Primary generalized (osteo)arthritis: Secondary | ICD-10-CM

## 2015-03-16 DIAGNOSIS — M159 Polyosteoarthritis, unspecified: Secondary | ICD-10-CM | POA: Insufficient documentation

## 2015-03-16 DIAGNOSIS — I10 Essential (primary) hypertension: Secondary | ICD-10-CM | POA: Diagnosis not present

## 2015-03-16 DIAGNOSIS — K264 Chronic or unspecified duodenal ulcer with hemorrhage: Secondary | ICD-10-CM | POA: Diagnosis not present

## 2015-03-16 DIAGNOSIS — F32A Depression, unspecified: Secondary | ICD-10-CM

## 2015-03-16 MED ORDER — HYDROCODONE-ACETAMINOPHEN 5-325 MG PO TABS: ORAL_TABLET | ORAL | Status: DC

## 2015-03-16 NOTE — Progress Notes (Signed)
Patient ID: Maria Christian, female   DOB: 1924-06-15, 81 y.o.   MRN: [id number]   Facility: [facility]      Allergies  Allergen Reactions  . Aspirin Other (See Comments)    Maria Christian only    Chief Complaint  Patient presents with  . Hospitalization Follow-up    HPI:  She is a long term resident of this facility who has been hospitalized for a duodenal ulcer with GI bleed. She had an upper endoscopy performed on 03-12-15. She is presently being treated for an uti. She did receive one unit packed red blood cells. She states that she is feeling better. She is not voicing any complaints at this time.    Past Medical History  Diagnosis Date  . Atrial fibrillation ([place])   . Altered mental status   . Encephalopathy   . Parkinson disease ([place])   . Hypertension   . GERD (gastroesophageal reflux disease)   . Hyperlipemia   . Diabetes mellitus   . Coronary artery disease   . History of adenomatous polyp of colon 06/24/99  . Enlarged heart   . DEMENTIA   . Edema of lower extremity 07/13/11    right leg more swollen than left leg  . Esophageal dysmotility 07/02/12  . Horseshoe kidney   . Pancreatic lesion 05/22/11    no further workup  per PCP/family due to age  . Anemia   . Peripheral neuropathy ([place])   . Depression   . Thrombophlebitis   . TIA (transient ischemic attack) 12/19/2012  . Sigmoid volvulus ([place]) 05/24/2013    Past Surgical History  Procedure Laterality Date  . Shoulder surgery  2001    left clavicle excision and acromioplasty  . Abdominal hysterectomy    . Cholecystectomy    . Total hip arthroplasty    . Breast surgery      2 benign tumors removed left breast  . Foot surgery      benign tumors from foot  . Esophageal dilation      several times by Dr. [name]  . Nose surgery    . Esophagogastroduodenoscopy (egd) with esophageal dilation N/A 08/01/2012    Procedure: ESOPHAGOGASTRODUODENOSCOPY (EGD) WITH ESOPHAGEAL DILATION;  Surgeon: [name], MD;  Location: [facility];  Service: Endoscopy;  Laterality: N/A;  with c-arm savory dilators  . Flexible sigmoidoscopy N/A 05/24/2013    Procedure: FLEXIBLE SIGMOIDOSCOPY;  Surgeon: [name], MD;  Location: [facility];  Service: Endoscopy;  Laterality: N/A;  . Flexible sigmoidoscopy N/A 05/26/2013    Procedure:  flex with decompression of sigmoid volvulus;  Surgeon: [name], MD;  Location: [facility];  Service: Endoscopy;  Laterality: N/A;  . Esophagogastroduodenoscopy (egd) with propofol N/A 03/12/2015    Procedure: ESOPHAGOGASTRODUODENOSCOPY (EGD) WITH PROPOFOL;  Surgeon: [name], MD;  Location: [facility];  Service: Endoscopy;  Laterality: N/A;    VITAL SIGNS BP 140/78 mmHg  Pulse 69  Ht 5\' 7"  (1.702 m)  Wt 143 lb (64.864 kg)  BMI 22.39 kg/m2  SpO2 94%  Patient's Medications  New Prescriptions   No medications on file  Previous Medications   ACETAMINOPHEN (TYLENOL) 325 MG TABLET    Take 2 tablets (650 mg total) by mouth every 6 (six) hours as needed for mild pain.   AMLODIPINE (NORVASC) 2.5 MG TABLET    Take 1 tablet (2.5 mg total) by mouth daily.   ASPIRIN EC 81 MG TABLET    Take 1 tablet (  81 mg total) by mouth daily. Start from 03/29/15   BACLOFEN (LIORESAL) 10 MG TABLET    Take 5 mg by mouth at bedtime.   BISACODYL (DULCOLAX) 10 MG SUPPOSITORY    Place 10 mg rectally as needed for mild constipation or moderate constipation.   CARVEDILOL (COREG) 3.125 MG TABLET    Take 3.125 mg by mouth daily.   CHOLECALCIFEROL (VITAMIN D) 1000 UNITS TABLET    Take 1,000 Units by mouth daily.   CIPROFLOXACIN (CIPRO) 500 MG TABLET    Take 1 tablet (500 mg total) by mouth daily.   CRANBERRY 475 MG CAPS    Take 1 capsule by mouth 2 (two) times daily.   DOCUSATE SODIUM (COLACE) 100 MG CAPSULE    Take 200 mg by mouth daily.   DORZOLAMIDE (TRUSOPT) 2 % OPHTHALMIC SOLUTION    Place 1 drop into both eyes 3 (three) times daily.    ESCITALOPRAM (LEXAPRO) 10 MG TABLET     Take 1 tablet (10 mg total) by mouth daily.   FERROUS SULFATE 325 (65 FE) MG TABLET    Take 325 mg by mouth daily with breakfast.   FLUTICASONE (FLONASE) 50 MCG/ACT NASAL SPRAY    Place 2 sprays into both nostrils at bedtime.   FUROSEMIDE (LASIX) 40 MG TABLET    Take 1 tablet (40 mg total) by mouth daily.   HYDROCODONE-ACETAMINOPHEN (NORCO/VICODIN) 5-325 MG TABLET    Take one tablet by mouth every 6 hours as needed for pain, NTE 3000mg  of APAP from all sources/24h   LIDOCAINE (LIDODERM) 5 %    Place 1 patch onto the skin daily. Apply 1 patch to left lower back/hip every morning.  Remove & Discard patch within 12 hours or as directed by MD   LINACLOTIDE (LINZESS) 145 MCG CAPS CAPSULE    Take 145 mcg by mouth daily.   LORATADINE (CLARITIN) 10 MG TABLET    Take 10 mg by mouth daily.   MULTIPLE VITAMINS-MINERALS (CERTAGEN PO)    Take 1 tablet by mouth daily.    ONDANSETRON (ZOFRAN) 8 MG TABLET    Take 8 mg by mouth every 8 (eight) hours as needed for nausea or vomiting.   PANTOPRAZOLE (PROTONIX) 40 MG TABLET    Take 1 tablet (40 mg total) by mouth 2 (two) times daily. Protonix 40 mg po twice daily for one month then change to 40 mg po once daily   POLYETHYL GLYCOL-PROPYL GLYCOL (SYSTANE) 0.4-0.3 % SOLN    Place 1 drop into both eyes 4 (four) times daily.    POTASSIUM CHLORIDE SA (K-DUR,KLOR-CON) 20 MEQ TABLET    Take 20 mEq by mouth daily.   SENNA (SENOKOT) 8.6 MG TABS TABLET    Take 1 tablet (8.6 mg total) by mouth 2 (two) times daily.   SODIUM CHLORIDE (OCEAN) 0.65 % SOLN NASAL SPRAY    Place 1 spray into both nostrils as directed. Four times daily And every 24 hours as needed to moisturize nasal passages.   TRAVOPROST, BAK FREE, (TRAVATAN) 0.004 % SOLN OPHTHALMIC SOLUTION    Place 1 drop into both eyes at bedtime.    TRAZODONE (DESYREL) 50 MG TABLET    Take 0.5 tablets (25 mg total) by mouth at bedtime as needed for sleep.   VITAMIN B-12 (CYANOCOBALAMIN) 500 MCG TABLET    Take 500 mcg by mouth daily.   Modified Medications   No medications on file  Discontinued Medications   No medications on file  SIGNIFICANT DIAGNOSTIC EXAMS  07-16-14: right hand x-ray: no acute abnormalities   10-13-14: chest x-ray: no acute cardiopulmonary process present.   12-16-14: chest x-ray: moderate cardiomegaly; with mild pulmonary venous congestion  12-21-14: chest x-ray: no acute cardiopulmonary process   01-02-15: left hand x-ray: moderate osteoarthritis   02-10-15: kub: unremarkable abdomen exam  02-15-15: abdominal ultrasound: 1. Gallbladder not visualized. 2. No hydronephrosis no renal calculus; 3. The pancreas not well visualized; which could be related to gassy abdomen but an appearance which might represent pancreatitis in the appropriate clinical setting.   02-26-15: right lower extremity doppler: - No evidence of deep vein thrombosis involving the right lower   extremity. - No evidence of Baker&'s cyst on the right.  03-11-15: kub: Stable bowel gas pattern over this series of exams with continued gaseous distension of the redundant sigmoid colon. Favor chronic decreased colonic motility.  03-12-15: upper endoscopy: duodenal bulb 1.5-2.0 cm ulcer with clot at base. No active bleeding. Surrounding mucosa was edematous. Biopsy of the surrounding mucosa were done.     LABS REVIEWED:   07-17-14: wbc 3.8; hgb 9.7; ;hct 31.;2 mcv 108; plt 113; glucose 154; bun 21.3; creat 1.43;  Liver normal albumin 4.0; tsh 6.13; hgb a1c 6.1; uric acid 7.1  08-18-14: tsh 4.09  10-13-14: wbc 4.7; hgb 9.7; hct 31.1; mcv 105.3; plt 111; glucose 176; bun 27.7; creat 1.44; k+4.4; na++143; C02: 35; liver normal albumin 4.5 12-21-14: wbc 5.1; hgb 9.7; hct 31.2; mcv 106.7; plt 129; glucose 217; bun 27.6; creat 1.37; k+ 3.8; na++135 01-06-15: hgb a1c 6.8; chol 178; ldl 110; trig 122; hdl 44 01-19-15: wbc 8.2; hgb 9.2; hct 31.4; mcv 90.2; plt 255; glucose 181; bun 24.4; creat 1.05; k+ 4.4; na++139 02-05-15: vit I94: 854; folic  acid 62.7; iron 64  02-11-15: wbc 5.3; hgb 10.2; hct 31.2; mcv 102.3; plt 145 glucose 136; bun 36; creat 1.59; k+ 4.0; na++136; liver normal albumin 3.9; lipase 20; sed rate 40  02-13-15: urine culture: gram neg rod  02-26-15: wbc 5.5 ;hgb 10.7; hct 32.1; mcv 102.9; plt 182; glucose 160; bun 23; creat 1.28; k+ 3.4; na++141; liver normal albumin 3.9; urine culture: 20,000 colonies e-coli 02-26-14: wbc 4.8; hgb 9.9; hct 30.2; mcv 103.4; plt 167; glucose 136; bun 20; creat 1.32; k+ 3.1; na++140; liver normal albumin 3.4 phos 3.6; mag 2.0; tsh 5.235  03-09-15: wbc 5.8; hgb 10.2; hct 30.3; mcv 103.1; plt 221; glucose 137; bun 14; creat 1.07; k+ 3.2; na++139; liver normal albumin 3.7; lipase 24; urine culture: e-coli: cipro 03-12-15: wbc 4.5 hgb 7.6; hct 23.0; mcv 101.3; plt 168; glucose 138; bun 40; creat 1.24; k+ 3.0; na++141; liver normal albumin 2.8 03-15-15: wbc 5.4; hgb 8.1; hct 25.1; mcv 105.5; plt 171; glucose 144; bun 13; creat 0.96; k+ 3.2; na++141       Review of Systems  Constitutional: Negative for appetite change and fatigue.  HENT: Negative for congestion.   Respiratory: Negative for cough, chest tightness and shortness of breath.   Cardiovascular: Negative for chest pain, palpitations and leg swelling.  Gastrointestinal: Negative for nausea, abdominal pain, diarrhea and constipation.  Musculoskeletal: Negative for myalgias and arthralgias.  Skin: Negative for pallor.  Neurological: Negative for dizziness.  Psychiatric/Behavioral: The patient is not nervous/anxious.       Physical Exam  Constitutional: No distress.  Eyes: Conjunctivae are normal.  Neck: Neck supple. No JVD present. No thyromegaly present.  Cardiovascular: Normal rate, regular rhythm and intact distal pulses.   Respiratory: Effort  normal and breath sounds normal. No respiratory distress. She has no wheezes.  Is 02 dependent  GI: Soft. Bowel sounds are normal. She exhibits no distension. There is tenderness.    Musculoskeletal: She exhibits no edema.  Able to move all extremities   Lymphadenopathy:    She has no cervical adenopathy.  Neurological: She is alert.  Skin: Skin is warm and dry. She is not diaphoretic.  Psychiatric: She has a normal mood and affect.     ASSESSMENT/ PLAN:  1. Hypertension: will continue norvasc 2.5 mg daily coreg 3.15 mg daily   2. Chronic diastolic heart failure: will continue lasix 40 mg daily with k+ 20 meq daily   3. Duodenal ulcer: will continue protonix 40 mg twice daily for one month then 40 mg daily   4. UTI: will complete cipro and will continue cranberry daily   5. Anemia: will continue iron daily hgb is 8.1  6. Chronic afib: will continue coreg 3.125 mg daily for rate control. Will begin asa 81mg  on 03-29-15.  Her plavix was stopped in the hospital   7. Chronic respiratory failure: is 02 dependent.   8. Allergic rhinitis: will continue flonase nightly and claritin 10 mg daily   9. Chronic pain due to osteoarthritis: will continue lidoderm to lower back daily; and vicodin 5/325 mg every 6 hours as needed for pain will continue baclofen 5 mg nightly for leg spasticity and will monitor   10. Depression: is stable receives benefit form lexapro 10 mg daily has trazodone 25 mg nightly as needed for sleep   11. Chronic constipation: will continue colace 200 mg daily linzess 145 mcg daily; senna twice daily   12. Glaucoma: will continue  trusopt to both eyes three times daily; travatan to both eyes nightly   Will check cbc cmp   Time spent with patient  50  minutes >50% time spent counseling; reviewing medical record; tests; labs; and developing future plan of care     Ok Edwards NP [facility] Adult [facility]  Contact [phone] Monday through Friday 8am- 5pm  After hours call [phone]

## 2015-03-16 NOTE — Telephone Encounter (Signed)
[facility] 

## 2015-03-17 ENCOUNTER — Encounter: Payer: Self-pay | Admitting: Gastroenterology

## 2015-03-22 ENCOUNTER — Encounter: Payer: Self-pay | Admitting: Internal Medicine

## 2015-03-22 ENCOUNTER — Non-Acute Institutional Stay (SKILLED_NURSING_FACILITY): Payer: Medicare Other | Admitting: Internal Medicine

## 2015-03-22 DIAGNOSIS — I482 Chronic atrial fibrillation, unspecified: Secondary | ICD-10-CM

## 2015-03-22 DIAGNOSIS — K5909 Other constipation: Secondary | ICD-10-CM | POA: Diagnosis not present

## 2015-03-22 DIAGNOSIS — I10 Essential (primary) hypertension: Secondary | ICD-10-CM | POA: Diagnosis not present

## 2015-03-22 DIAGNOSIS — N390 Urinary tract infection, site not specified: Secondary | ICD-10-CM | POA: Diagnosis not present

## 2015-03-22 DIAGNOSIS — I5032 Chronic diastolic (congestive) heart failure: Secondary | ICD-10-CM | POA: Diagnosis not present

## 2015-03-22 DIAGNOSIS — J9610 Chronic respiratory failure, unspecified whether with hypoxia or hypercapnia: Secondary | ICD-10-CM

## 2015-03-22 DIAGNOSIS — K269 Duodenal ulcer, unspecified as acute or chronic, without hemorrhage or perforation: Secondary | ICD-10-CM

## 2015-03-22 DIAGNOSIS — E876 Hypokalemia: Secondary | ICD-10-CM

## 2015-03-22 DIAGNOSIS — D509 Iron deficiency anemia, unspecified: Secondary | ICD-10-CM

## 2015-03-22 DIAGNOSIS — G8929 Other chronic pain: Secondary | ICD-10-CM | POA: Diagnosis not present

## 2015-03-22 DIAGNOSIS — R627 Adult failure to thrive: Secondary | ICD-10-CM | POA: Diagnosis not present

## 2015-03-22 DIAGNOSIS — F0390 Unspecified dementia without behavioral disturbance: Secondary | ICD-10-CM

## 2015-03-22 NOTE — Progress Notes (Signed)
Patient ID: Maria Christian, female   DOB: 1925-05-16, 81 y.o.   MRN: [id number]    HISTORY AND PHYSICAL   DATE: 03/22/15  Location:  [facility]    Place of Service: SNF [phone])   Extended Emergency Contact Information Primary Emergency Contact: [place] of [place] Phone: [phone] Relation: Son Secondary Emergency Contact: Roberts,Lynn Address: [address]          [place], [place] [address] [name] of [place] Phone: [phone] Mobile Phone: [phone] Relation: Daughter  Advanced Directive information  DNR  Chief Complaint  Patient presents with  . Readmit To SNF    HPI:  81 yo female long term resident seen today as a readmission into SNF following hospital stay for duodenal ulcer hemorrhage, acute anemia, hypokalemia and E coli UTI. She had an EGD which showed bleeding ulcer at duodenal bulb. Tx with PPI. UTI tx with IV cipro -->po cipro. Transfused 1unit PRBC for Hgb 6.8-->8.1. K+ 3.2 repleetd prior to d/c.  She is a poor historian due to dementia. Hx obtained from chart. She c/o sacral soreness. Nursing applying coconut oil at night and calazime during the day. No falls. Appetite ok.  Hypertension - BP stable on norvasc 2.5 mg daily; coreg 3.15 mg daily   Chronic diastolic heart failure -stable on lasix 40 mg daily with k+ 20 meq daily   Recurrent UTI - takes cranberry daily. Completed cipro 10/11th  Hx Anemia - Hgb 8.1. Takes iron daily   Chronic afib - rate controlled on coreg 3.125 mg daily. Takes asa 61m daily. Her plavix was stopped in the hospital   Chronic respiratory failure - 02 dependent. O2 sats stable  Allergic rhinitis - stable on flonase nightly and claritin 10 mg daily   Chronic pain due to osteoarthritis - pain stable on lidoderm to lower back daily; norco 5/325 mg every 6 hours as needed; baclofen 5 mg nightly for leg spasticity   Depression/insomnia - mood stable on lexapro 10 mg  daily. Takes trazodone 25 mg nightly as needed for sleep   Chronic constipation - stable on colace 200 mg daily, linzess 145 mcg daily; senna twice daily   Glaucoma - stable on  trusopt to both eyes three times daily; travatan to both eyes nightly. Followed by ophthamology   Past Medical History  Diagnosis Date  . Atrial fibrillation ([place]   . Altered mental status   . Encephalopathy   . Parkinson disease ([place]   . Hypertension   . GERD (gastroesophageal reflux disease)   . Hyperlipemia   . Diabetes mellitus   . Coronary artery disease   . History of adenomatous polyp of colon 06/24/99  . Enlarged heart   . DEMENTIA   . Edema of lower extremity 07/13/11    right leg more swollen than left leg  . Esophageal dysmotility 07/02/12  . Horseshoe kidney   . Pancreatic lesion 05/22/11    no further workup  per PCP/family due to age  . Anemia   . Peripheral neuropathy ([place]   . Depression   . Thrombophlebitis   . TIA (transient ischemic attack) 12/19/2012  . Sigmoid volvulus ([place] 05/24/2013    Past Surgical History  Procedure Laterality Date  . Shoulder surgery  2001    left clavicle excision and acromioplasty  . Abdominal hysterectomy    . Cholecystectomy    . Total hip arthroplasty    . Breast surgery      2 benign  tumors removed left breast  . Foot surgery      benign tumors from foot  . Esophageal dilation      several times by Dr. [name]  . Nose surgery    . Esophagogastroduodenoscopy (egd) with esophageal dilation N/A 08/01/2012    Procedure: ESOPHAGOGASTRODUODENOSCOPY (EGD) WITH ESOPHAGEAL DILATION;  Surgeon: [name], MD;  Location: [facility];  Service: Endoscopy;  Laterality: N/A;  with c-arm savory dilators  . Flexible sigmoidoscopy N/A 05/24/2013    Procedure: FLEXIBLE SIGMOIDOSCOPY;  Surgeon: [name], MD;  Location: [facility];  Service: Endoscopy;  Laterality: N/A;  . Flexible sigmoidoscopy N/A 05/26/2013    Procedure:  flex with decompression  of sigmoid volvulus;  Surgeon: [name], MD;  Location: [facility];  Service: Endoscopy;  Laterality: N/A;  . Esophagogastroduodenoscopy (egd) with propofol N/A 03/12/2015    Procedure: ESOPHAGOGASTRODUODENOSCOPY (EGD) WITH PROPOFOL;  Surgeon: [name], MD;  Location: [facility];  Service: Endoscopy;  Laterality: N/A;    Patient Care Team: Gerlene Fee, NP as Nurse Practitioner (Nurse Practitioner)  Social History   Social History  . Marital Status: Widowed    Spouse Name: N/A  . Number of Children: 5  . Years of Education: 10th   Occupational History  . Retired    Social History Main Topics  . Smoking status: Former Smoker    Quit date: 10/09/1964  . Smokeless tobacco: Never Used  . Alcohol Use: No  . Drug Use: No  . Sexual Activity: No   Other Topics Concern  . Not on file   Social History Narrative   Pt lives at [facility] and [place].   Caffeine Use: very small amount daily     reports that she quit smoking about 50 years ago. She has never used smokeless tobacco. She reports that she does not drink alcohol or use illicit drugs.  Family History  Problem Relation Age of Onset  . Cancer    . Heart disease     Family Status  Relation Status Death Age  . Mother Deceased 75    blood clots  . Father Deceased 26    cirrohis of the liver    Immunization History  Administered Date(s) Administered  . PPD Test 07/28/2013    Allergies  Allergen Reactions  . Aspirin Other (See Comments)    G.I. Upset only    Medications: Patient's Medications  New Prescriptions   No medications on file  Previous Medications   ACETAMINOPHEN (TYLENOL) 325 MG TABLET    Take 2 tablets (650 mg total) by mouth every 6 (six) hours as needed for mild pain.   AMLODIPINE (NORVASC) 2.5 MG TABLET    Take 1 tablet (2.5 mg total) by mouth daily.   ASPIRIN EC 81 MG TABLET    Take 1 tablet (81 mg total) by mouth daily. Start from 03/29/15   BACLOFEN (LIORESAL) 10  MG TABLET    Take 5 mg by mouth at bedtime.   BISACODYL (DULCOLAX) 10 MG SUPPOSITORY    Place 10 mg rectally as needed for mild constipation or moderate constipation.   CARVEDILOL (COREG) 3.125 MG TABLET    Take 3.125 mg by mouth daily.   CHOLECALCIFEROL (VITAMIN D) 1000 UNITS TABLET    Take 1,000 Units by mouth daily.   CIPROFLOXACIN (CIPRO) 500 MG TABLET    Take 1 tablet (500 mg total) by mouth daily.   CRANBERRY 475 MG CAPS    Take 1  capsule by mouth 2 (two) times daily.   DOCUSATE SODIUM (COLACE) 100 MG CAPSULE    Take 200 mg by mouth daily.   DORZOLAMIDE (TRUSOPT) 2 % OPHTHALMIC SOLUTION    Place 1 drop into both eyes 3 (three) times daily.    ESCITALOPRAM (LEXAPRO) 10 MG TABLET    Take 1 tablet (10 mg total) by mouth daily.   FERROUS SULFATE 325 (65 FE) MG TABLET    Take 325 mg by mouth daily with breakfast.   FLUTICASONE (FLONASE) 50 MCG/ACT NASAL SPRAY    Place 2 sprays into both nostrils at bedtime.   FUROSEMIDE (LASIX) 40 MG TABLET    Take 1 tablet (40 mg total) by mouth daily.   HYDROCODONE-ACETAMINOPHEN (NORCO/VICODIN) 5-325 MG TABLET    Take one tablet by mouth every 6 hours as needed for pain, NTE 3038m of APAP from all sources/24h   LIDOCAINE (LIDODERM) 5 %    Place 1 patch onto the skin daily. Apply 1 patch to left lower back/hip every morning.  Remove & Discard patch within 12 hours or as directed by MD   LINACLOTIDE (LINZESS) 145 MCG CAPS CAPSULE    Take 145 mcg by mouth daily.   LORATADINE (CLARITIN) 10 MG TABLET    Take 10 mg by mouth daily.   MULTIPLE VITAMINS-MINERALS (CERTAGEN PO)    Take 1 tablet by mouth daily.    ONDANSETRON (ZOFRAN) 8 MG TABLET    Take 8 mg by mouth every 8 (eight) hours as needed for nausea or vomiting.   PANTOPRAZOLE (PROTONIX) 40 MG TABLET    Take 1 tablet (40 mg total) by mouth 2 (two) times daily. Protonix 40 mg po twice daily for one month then change to 40 mg po once daily   POLYETHYL GLYCOL-PROPYL GLYCOL (SYSTANE) 0.4-0.3 % SOLN    Place 1 drop  into both eyes 4 (four) times daily.    POTASSIUM CHLORIDE SA (K-DUR,KLOR-CON) 20 MEQ TABLET    Take 20 mEq by mouth daily.   SENNA (SENOKOT) 8.6 MG TABS TABLET    Take 1 tablet (8.6 mg total) by mouth 2 (two) times daily.   SODIUM CHLORIDE (OCEAN) 0.65 % SOLN NASAL SPRAY    Place 1 spray into both nostrils as directed. Four times daily And every 24 hours as needed to moisturize nasal passages.   TRAVOPROST, BAK FREE, (TRAVATAN) 0.004 % SOLN OPHTHALMIC SOLUTION    Place 1 drop into both eyes at bedtime.    TRAZODONE (DESYREL) 50 MG TABLET    Take 0.5 tablets (25 mg total) by mouth at bedtime as needed for sleep.   VITAMIN B-12 (CYANOCOBALAMIN) 500 MCG TABLET    Take 500 mcg by mouth daily.  Modified Medications   No medications on file  Discontinued Medications   No medications on file    Review of Systems  Unable to perform ROS: Dementia    Filed Vitals:   03/22/15 2345  BP: 151/57  Pulse: 65  Temp: 98.2 F (36.8 C)  Weight: 149 lb (67.586 kg)  SpO2: 98%   Body mass index is 23.33 kg/(m^2).  Physical Exam  Constitutional: She appears well-developed and well-nourished.  Lying in bed resting in NAD  HENT:  Mouth/Throat: Oropharynx is clear and moist. No oropharyngeal exudate.  Eyes: Pupils are equal, round, and reactive to light. No scleral icterus.  Neck: Neck supple. Carotid bruit is not present. No tracheal deviation present. No thyromegaly present.  Cardiovascular: Normal rate and intact distal pulses.  An irregularly irregular rhythm present. Exam reveals no gallop and no friction rub.   Murmur (1/6 SEM) heard. Trace LE edema b/l. No calf TTP  Pulmonary/Chest: Effort normal and breath sounds normal. No stridor. No respiratory distress. She has no wheezes. She has no rales.  Abdominal: Soft. Bowel sounds are normal. She exhibits distension. She exhibits no mass. There is no hepatomegaly. There is no tenderness. There is no rebound and no guarding.  Musculoskeletal: She  exhibits edema.  Lymphadenopathy:    She has no cervical adenopathy.  Neurological: She is alert.  Skin: Skin is warm and dry. No rash noted.  Psychiatric: She has a normal mood and affect. Her behavior is normal.     Labs reviewed: Admission on 03/11/2015, Discharged on 03/15/2015  Component Date Value Ref Range Status  . Sodium 03/11/2015 138  135 - 145 mmol/L Final  . Potassium 03/11/2015 3.6  3.5 - 5.1 mmol/L Final  . Chloride 03/11/2015 108  101 - 111 mmol/L Final  . CO2 03/11/2015 20* 22 - 32 mmol/L Final  . Glucose, Bld 03/11/2015 180* 65 - 99 mg/dL Final  . BUN 03/11/2015 51* 6 - 20 mg/dL Final  . Creatinine, Ser 03/11/2015 1.76* 0.44 - 1.00 mg/dL Final  . Calcium 03/11/2015 9.0  8.9 - 10.3 mg/dL Final  . Total Protein 03/11/2015 6.5  6.5 - 8.1 g/dL Final  . Albumin 03/11/2015 3.3* 3.5 - 5.0 g/dL Final  . AST 03/11/2015 16  15 - 41 U/L Final  . ALT 03/11/2015 7* 14 - 54 U/L Final  . Alkaline Phosphatase 03/11/2015 60  38 - 126 U/L Final  . Total Bilirubin 03/11/2015 0.3  0.3 - 1.2 mg/dL Final  . GFR calc non Af Amer 03/11/2015 25* >60 mL/min Final  . GFR calc Af Amer 03/11/2015 29* >60 mL/min Final   Comment: (NOTE) The eGFR has been calculated using the CKD EPI equation. This calculation has not been validated in all clinical situations. eGFR's persistently <60 mL/min signify possible Chronic Kidney Disease.   . Anion gap 03/11/2015 10  5 - 15 Final  . WBC 03/11/2015 5.6  4.0 - 10.5 K/uL Final  . RBC 03/11/2015 2.27* 3.87 - 5.11 MIL/uL Final  . Hemoglobin 03/11/2015 7.9* 12.0 - 15.0 g/dL Final   Comment: REPEATED TO VERIFY DELTA CHECK NOTED   . HCT 03/11/2015 23.4* 36.0 - 46.0 % Final  . MCV 03/11/2015 103.1* 78.0 - 100.0 fL Final  . MCH 03/11/2015 34.8* 26.0 - 34.0 pg Final  . MCHC 03/11/2015 33.8  30.0 - 36.0 g/dL Final  . RDW 03/11/2015 14.9  11.5 - 15.5 % Final  . Platelets 03/11/2015 203  150 - 400 K/uL Final  . Neutrophils Relative % 03/11/2015 65   Final   . Neutro Abs 03/11/2015 3.7  1.7 - 7.7 K/uL Final  . Lymphocytes Relative 03/11/2015 24   Final  . Lymphs Abs 03/11/2015 1.4  0.7 - 4.0 K/uL Final  . Monocytes Relative 03/11/2015 8   Final  . Monocytes Absolute 03/11/2015 0.4  0.1 - 1.0 K/uL Final  . Eosinophils Relative 03/11/2015 3   Final  . Eosinophils Absolute 03/11/2015 0.2  0.0 - 0.7 K/uL Final  . Basophils Relative 03/11/2015 1   Final  . Basophils Absolute 03/11/2015 0.0  0.0 - 0.1 K/uL Final  . Lactic Acid, Venous 03/11/2015 1.63  0.5 - 2.0 mmol/L Final  . Fecal Occult Bld 03/11/2015 POSITIVE* NEGATIVE Final  . Lipase 03/11/2015 23  22 -  51 U/L Final  . Prothrombin Time 03/11/2015 17.2* 11.6 - 15.2 seconds Final  . INR 03/11/2015 1.40  0.00 - 1.49 Final  . Order Confirmation 03/11/2015 ORDER PROCESSED BY BLOOD BANK   Final  . ABO/RH(D) 03/11/2015 O POS   Final  . Antibody Screen 03/11/2015 NEG   Final  . Sample Expiration 03/11/2015 03/14/2015   Final  . Unit Number 03/11/2015 [id number]   Final  . Blood Component Type 03/11/2015 RBC LR PHER1   Final  . Unit division 03/11/2015 00   Final  . Status of Unit 03/11/2015 REL FROM [facility]   Final  . Transfusion Status 03/11/2015 OK TO TRANSFUSE   Final  . Crossmatch Result 03/11/2015 Compatible   Final  . Unit Number 03/11/2015 [id number]   Final  . Blood Component Type 03/11/2015 RED CELLS,LR   Final  . Unit division 03/11/2015 00   Final  . Status of Unit 03/11/2015 ISSUED,FINAL   Final  . Transfusion Status 03/11/2015 OK TO TRANSFUSE   Final  . Crossmatch Result 03/11/2015 Compatible   Final  . Hemoglobin 03/11/2015 7.4* 12.0 - 15.0 g/dL Final  . HCT 03/11/2015 21.8* 36.0 - 46.0 % Final  . Hemoglobin 03/11/2015 6.8* 12.0 - 15.0 g/dL Final   Comment: REPEATED TO VERIFY CRITICAL RESULT CALLED TO, READ BACK BY AND VERIFIED WITH: Q.CORE,RN AT 2110 ON 03/11/15 BY W.SHEA   . HCT 03/11/2015 20.0* 36.0 - 46.0 % Final  . WBC 03/12/2015 4.5  4.0 - 10.5 K/uL Final  . RBC  03/12/2015 2.27* 3.87 - 5.11 MIL/uL Final  . Hemoglobin 03/12/2015 7.6* 12.0 - 15.0 g/dL Final  . HCT 03/12/2015 23.0* 36.0 - 46.0 % Final  . MCV 03/12/2015 101.3* 78.0 - 100.0 fL Final  . MCH 03/12/2015 33.5  26.0 - 34.0 pg Final  . MCHC 03/12/2015 33.0  30.0 - 36.0 g/dL Final  . RDW 03/12/2015 15.2  11.5 - 15.5 % Final  . Platelets 03/12/2015 168  150 - 400 K/uL Final  . Sodium 03/12/2015 141  135 - 145 mmol/L Final  . Potassium 03/12/2015 3.0* 3.5 - 5.1 mmol/L Final  . Chloride 03/12/2015 114* 101 - 111 mmol/L Final  . CO2 03/12/2015 21* 22 - 32 mmol/L Final  . Glucose, Bld 03/12/2015 138* 65 - 99 mg/dL Final  . BUN 03/12/2015 40* 6 - 20 mg/dL Final  . Creatinine, Ser 03/12/2015 1.54* 0.44 - 1.00 mg/dL Final  . Calcium 03/12/2015 8.6* 8.9 - 10.3 mg/dL Final  . Total Protein 03/12/2015 5.6* 6.5 - 8.1 g/dL Final  . Albumin 03/12/2015 2.8* 3.5 - 5.0 g/dL Final  . AST 03/12/2015 12* 15 - 41 U/L Final  . ALT 03/12/2015 6* 14 - 54 U/L Final  . Alkaline Phosphatase 03/12/2015 49  38 - 126 U/L Final  . Total Bilirubin 03/12/2015 0.6  0.3 - 1.2 mg/dL Final  . GFR calc non Af Amer 03/12/2015 29* >60 mL/min Final  . GFR calc Af Amer 03/12/2015 34* >60 mL/min Final   Comment: (NOTE) The eGFR has been calculated using the CKD EPI equation. This calculation has not been validated in all clinical situations. eGFR's persistently <60 mL/min signify possible Chronic Kidney Disease.   . Anion gap 03/12/2015 6  5 - 15 Final  . Hemoglobin 03/12/2015 8.1* 12.0 - 15.0 g/dL Final  . HCT 03/12/2015 23.4* 36.0 - 46.0 % Final  . Hemoglobin 03/12/2015 7.4* 12.0 - 15.0 g/dL Final  . HCT 03/12/2015 22.6* 36.0 - 46.0 %  Final  . WBC 03/13/2015 4.4  4.0 - 10.5 K/uL Final  . RBC 03/13/2015 2.29* 3.87 - 5.11 MIL/uL Final  . Hemoglobin 03/13/2015 7.8* 12.0 - 15.0 g/dL Final  . HCT 03/13/2015 23.9* 36.0 - 46.0 % Final  . MCV 03/13/2015 104.4* 78.0 - 100.0 fL Final  . MCH 03/13/2015 34.1* 26.0 - 34.0 pg Final    . MCHC 03/13/2015 32.6  30.0 - 36.0 g/dL Final  . RDW 03/13/2015 15.8* 11.5 - 15.5 % Final  . Platelets 03/13/2015 168  150 - 400 K/uL Final  . Sodium 03/13/2015 143  135 - 145 mmol/L Final  . Potassium 03/13/2015 3.5  3.5 - 5.1 mmol/L Final  . Chloride 03/13/2015 116* 101 - 111 mmol/L Final  . CO2 03/13/2015 20* 22 - 32 mmol/L Final  . Glucose, Bld 03/13/2015 164* 65 - 99 mg/dL Final  . BUN 03/13/2015 24* 6 - 20 mg/dL Final  . Creatinine, Ser 03/13/2015 1.37* 0.44 - 1.00 mg/dL Final  . Calcium 03/13/2015 8.5* 8.9 - 10.3 mg/dL Final  . GFR calc non Af Amer 03/13/2015 33* >60 mL/min Final  . GFR calc Af Amer 03/13/2015 39* >60 mL/min Final   Comment: (NOTE) The eGFR has been calculated using the CKD EPI equation. This calculation has not been validated in all clinical situations. eGFR's persistently <60 mL/min signify possible Chronic Kidney Disease.   . Anion gap 03/13/2015 7  5 - 15 Final  . Hemoglobin 03/13/2015 7.6* 12.0 - 15.0 g/dL Final  . HCT 03/13/2015 23.0* 36.0 - 46.0 % Final  . Hemoglobin 03/13/2015 7.5* 12.0 - 15.0 g/dL Final  . HCT 03/13/2015 22.7* 36.0 - 46.0 % Final  . Troponin I 03/13/2015 <0.03  <0.031 ng/mL Final   Comment:        NO INDICATION OF MYOCARDIAL INJURY.   . Troponin I 03/13/2015 0.03  <0.031 ng/mL Final   Comment:        NO INDICATION OF MYOCARDIAL INJURY.   . Hemoglobin 03/14/2015 7.5* 12.0 - 15.0 g/dL Final  . HCT 03/14/2015 22.6* 36.0 - 46.0 % Final  . Troponin I 03/14/2015 0.03  <0.031 ng/mL Final   Comment:        NO INDICATION OF MYOCARDIAL INJURY.   . Magnesium 03/13/2015 1.9  1.7 - 2.4 mg/dL Final  . Sodium 03/14/2015 143  135 - 145 mmol/L Final  . Potassium 03/14/2015 3.6  3.5 - 5.1 mmol/L Final  . Chloride 03/14/2015 117* 101 - 111 mmol/L Final  . CO2 03/14/2015 19* 22 - 32 mmol/L Final  . Glucose, Bld 03/14/2015 145* 65 - 99 mg/dL Final  . BUN 03/14/2015 16  6 - 20 mg/dL Final  . Creatinine, Ser 03/14/2015 1.27* 0.44 - 1.00  mg/dL Final  . Calcium 03/14/2015 8.5* 8.9 - 10.3 mg/dL Final  . GFR calc non Af Amer 03/14/2015 37* >60 mL/min Final  . GFR calc Af Amer 03/14/2015 42* >60 mL/min Final   Comment: (NOTE) The eGFR has been calculated using the CKD EPI equation. This calculation has not been validated in all clinical situations. eGFR's persistently <60 mL/min signify possible Chronic Kidney Disease.   . Anion gap 03/14/2015 7  5 - 15 Final  . WBC 03/15/2015 5.4  4.0 - 10.5 K/uL Final  . RBC 03/15/2015 2.38* 3.87 - 5.11 MIL/uL Final  . Hemoglobin 03/15/2015 8.1* 12.0 - 15.0 g/dL Final  . HCT 03/15/2015 25.1* 36.0 - 46.0 % Final  . MCV 03/15/2015 105.5* 78.0 - 100.0  fL Final  . MCH 03/15/2015 34.0  26.0 - 34.0 pg Final  . MCHC 03/15/2015 32.3  30.0 - 36.0 g/dL Final  . RDW 03/15/2015 16.1* 11.5 - 15.5 % Final  . Platelets 03/15/2015 171  150 - 400 K/uL Final  . Sodium 03/15/2015 141  135 - 145 mmol/L Final   REPEATED TO VERIFY  . Potassium 03/15/2015 3.2* 3.5 - 5.1 mmol/L Final   REPEATED TO VERIFY  . Chloride 03/15/2015 116* 101 - 111 mmol/L Final   REPEATED TO VERIFY  . CO2 03/15/2015 20* 22 - 32 mmol/L Final   REPEATED TO VERIFY  . Glucose, Bld 03/15/2015 144* 65 - 99 mg/dL Final  . BUN 03/15/2015 13  6 - 20 mg/dL Final  . Creatinine, Ser 03/15/2015 0.96  0.44 - 1.00 mg/dL Final  . Calcium 03/15/2015 8.3* 8.9 - 10.3 mg/dL Final   REPEATED TO VERIFY  . GFR calc non Af Amer 03/15/2015 51* >60 mL/min Final  . GFR calc Af Amer 03/15/2015 59* >60 mL/min Final   Comment: (NOTE) The eGFR has been calculated using the CKD EPI equation. This calculation has not been validated in all clinical situations. eGFR's persistently <60 mL/min signify possible Chronic Kidney Disease.   . Anion gap 03/15/2015 5  5 - 15 Final   REPEATED TO VERIFY  Admission on 03/09/2015, Discharged on 03/09/2015  Component Date Value Ref Range Status  . Lipase 03/09/2015 24  22 - 51 U/L Final  . Sodium 03/09/2015 139  135 -  145 mmol/L Final  . Potassium 03/09/2015 3.2* 3.5 - 5.1 mmol/L Final  . Chloride 03/09/2015 111  101 - 111 mmol/L Final  . CO2 03/09/2015 22  22 - 32 mmol/L Final  . Glucose, Bld 03/09/2015 137* 65 - 99 mg/dL Final  . BUN 03/09/2015 14  6 - 20 mg/dL Final  . Creatinine, Ser 03/09/2015 1.07* 0.44 - 1.00 mg/dL Final  . Calcium 03/09/2015 9.2  8.9 - 10.3 mg/dL Final  . Total Protein 03/09/2015 7.4  6.5 - 8.1 g/dL Final  . Albumin 03/09/2015 3.7  3.5 - 5.0 g/dL Final  . AST 03/09/2015 18  15 - 41 U/L Final  . ALT 03/09/2015 9* 14 - 54 U/L Final  . Alkaline Phosphatase 03/09/2015 72  38 - 126 U/L Final  . Total Bilirubin 03/09/2015 0.6  0.3 - 1.2 mg/dL Final  . GFR calc non Af Amer 03/09/2015 45* >60 mL/min Final  . GFR calc Af Amer 03/09/2015 52* >60 mL/min Final   Comment: (NOTE) The eGFR has been calculated using the CKD EPI equation. This calculation has not been validated in all clinical situations. eGFR's persistently <60 mL/min signify possible Chronic Kidney Disease.   . Anion gap 03/09/2015 6  5 - 15 Final  . WBC 03/09/2015 5.8  4.0 - 10.5 K/uL Final  . RBC 03/09/2015 2.94* 3.87 - 5.11 MIL/uL Final  . Hemoglobin 03/09/2015 10.2* 12.0 - 15.0 g/dL Final  . HCT 03/09/2015 30.3* 36.0 - 46.0 % Final  . MCV 03/09/2015 103.1* 78.0 - 100.0 fL Final  . MCH 03/09/2015 34.7* 26.0 - 34.0 pg Final  . MCHC 03/09/2015 33.7  30.0 - 36.0 g/dL Final  . RDW 03/09/2015 14.7  11.5 - 15.5 % Final  . Platelets 03/09/2015 221  150 - 400 K/uL Final  . Color, Urine 03/09/2015 YELLOW  YELLOW Final  . APPearance 03/09/2015 TURBID* CLEAR Final  . Specific Gravity, Urine 03/09/2015 1.013  1.005 - 1.030 Final  .  pH 03/09/2015 5.0  5.0 - 8.0 Final  . Glucose, UA 03/09/2015 NEGATIVE  NEGATIVE mg/dL Final  . Hgb urine dipstick 03/09/2015 SMALL* NEGATIVE Final  . Bilirubin Urine 03/09/2015 NEGATIVE  NEGATIVE Final  . Ketones, ur 03/09/2015 NEGATIVE  NEGATIVE mg/dL Final  . Protein, ur 03/09/2015 NEGATIVE   NEGATIVE mg/dL Final  . Urobilinogen, UA 03/09/2015 0.2  0.0 - 1.0 mg/dL Final  . Nitrite 03/09/2015 POSITIVE* NEGATIVE Final  . Leukocytes, UA 03/09/2015 LARGE* NEGATIVE Final  . Squamous Epithelial / LPF 03/09/2015 RARE  RARE Final  . WBC, UA 03/09/2015 TOO NUMEROUS TO COUNT  <3 WBC/hpf Final  . Bacteria, UA 03/09/2015 MANY* RARE Final  . Specimen Description 03/09/2015 URINE, CATHETERIZED   Final  . Special Requests 03/09/2015 NONE   Final  . Culture 03/09/2015    Final                   Value:>=100,000 COLONIES/mL ESCHERICHIA COLI Confirmed Extended Spectrum Beta-Lactamase Producer (ESBL) Performed at [facility]   . Report Status 03/09/2015 03/11/2015 FINAL   Final  . Organism ID, Bacteria 03/09/2015 ESCHERICHIA COLI   Final  Admission on 02/26/2015, Discharged on 02/28/2015  Component Date Value Ref Range Status  . Sodium 02/26/2015 141  135 - 145 mmol/L Final  . Potassium 02/26/2015 3.4* 3.5 - 5.1 mmol/L Final  . Chloride 02/26/2015 108  101 - 111 mmol/L Final  . CO2 02/26/2015 24  22 - 32 mmol/L Final  . Glucose, Bld 02/26/2015 160* 65 - 99 mg/dL Final  . BUN 02/26/2015 23* 6 - 20 mg/dL Final  . Creatinine, Ser 02/26/2015 1.28* 0.44 - 1.00 mg/dL Final  . Calcium 02/26/2015 9.4  8.9 - 10.3 mg/dL Final  . Total Protein 02/26/2015 7.3  6.5 - 8.1 g/dL Final  . Albumin 02/26/2015 3.9  3.5 - 5.0 g/dL Final  . AST 02/26/2015 17  15 - 41 U/L Final  . ALT 02/26/2015 10* 14 - 54 U/L Final  . Alkaline Phosphatase 02/26/2015 67  38 - 126 U/L Final  . Total Bilirubin 02/26/2015 0.5  0.3 - 1.2 mg/dL Final  . GFR calc non Af Amer 02/26/2015 36* >60 mL/min Final  . GFR calc Af Amer 02/26/2015 42* >60 mL/min Final   Comment: (NOTE) The eGFR has been calculated using the CKD EPI equation. This calculation has not been validated in all clinical situations. eGFR's persistently <60 mL/min signify possible Chronic Kidney Disease.   . Anion gap 02/26/2015 9  5 - 15 Final  . WBC  02/26/2015 5.5  4.0 - 10.5 K/uL Final  . RBC 02/26/2015 3.12* 3.87 - 5.11 MIL/uL Final  . Hemoglobin 02/26/2015 10.7* 12.0 - 15.0 g/dL Final  . HCT 02/26/2015 32.1* 36.0 - 46.0 % Final  . MCV 02/26/2015 102.9* 78.0 - 100.0 fL Final  . MCH 02/26/2015 34.3* 26.0 - 34.0 pg Final  . MCHC 02/26/2015 33.3  30.0 - 36.0 g/dL Final  . RDW 02/26/2015 13.8  11.5 - 15.5 % Final  . Platelets 02/26/2015 182  150 - 400 K/uL Final  . Neutrophils Relative % 02/26/2015 63   Final  . Neutro Abs 02/26/2015 3.5  1.7 - 7.7 K/uL Final  . Lymphocytes Relative 02/26/2015 23   Final  . Lymphs Abs 02/26/2015 1.3  0.7 - 4.0 K/uL Final  . Monocytes Relative 02/26/2015 9   Final  . Monocytes Absolute 02/26/2015 0.5  0.1 - 1.0 K/uL Final  . Eosinophils Relative 02/26/2015 4  Final  . Eosinophils Absolute 02/26/2015 0.2  0.0 - 0.7 K/uL Final  . Basophils Relative 02/26/2015 1   Final  . Basophils Absolute 02/26/2015 0.1  0.0 - 0.1 K/uL Final  . Color, Urine 02/26/2015 YELLOW  YELLOW Final  . APPearance 02/26/2015 CLOUDY* CLEAR Final  . Specific Gravity, Urine 02/26/2015 1.016  1.005 - 1.030 Final  . pH 02/26/2015 5.0  5.0 - 8.0 Final  . Glucose, UA 02/26/2015 NEGATIVE  NEGATIVE mg/dL Final  . Hgb urine dipstick 02/26/2015 NEGATIVE  NEGATIVE Final  . Bilirubin Urine 02/26/2015 NEGATIVE  NEGATIVE Final  . Ketones, ur 02/26/2015 NEGATIVE  NEGATIVE mg/dL Final  . Protein, ur 02/26/2015 NEGATIVE  NEGATIVE mg/dL Final  . Urobilinogen, UA 02/26/2015 0.2  0.0 - 1.0 mg/dL Final  . Nitrite 02/26/2015 NEGATIVE  NEGATIVE Final  . Leukocytes, UA 02/26/2015 LARGE* NEGATIVE Final  . Specimen Description 02/26/2015 URINE, CATHETERIZED   Final  . Special Requests 02/26/2015 Normal   Final  . Culture 02/26/2015    Final                   Value:20,000 COLONIES/mL ESCHERICHIA COLI Confirmed Extended Spectrum Beta-Lactamase Producer (ESBL) Performed at [facility]   . Report Status 02/26/2015 02/28/2015 FINAL   Final  .  Organism ID, Bacteria 02/26/2015 ESCHERICHIA COLI   Final  . Squamous Epithelial / LPF 02/26/2015 MANY* RARE Final  . WBC, UA 02/26/2015 21-50  <3 WBC/hpf Final  . Bacteria, UA 02/26/2015 FEW* RARE Final  . Magnesium 02/27/2015 2.0  1.7 - 2.4 mg/dL Final  . Phosphorus 02/27/2015 3.6  2.5 - 4.6 mg/dL Final  . TSH 02/27/2015 5.235* 0.350 - 4.500 uIU/mL Final  . Sodium 02/27/2015 140  135 - 145 mmol/L Final  . Potassium 02/27/2015 3.1* 3.5 - 5.1 mmol/L Final  . Chloride 02/27/2015 107  101 - 111 mmol/L Final  . CO2 02/27/2015 25  22 - 32 mmol/L Final  . Glucose, Bld 02/27/2015 136* 65 - 99 mg/dL Final  . BUN 02/27/2015 20  6 - 20 mg/dL Final  . Creatinine, Ser 02/27/2015 1.32* 0.44 - 1.00 mg/dL Final  . Calcium 02/27/2015 8.9  8.9 - 10.3 mg/dL Final  . Total Protein 02/27/2015 6.7  6.5 - 8.1 g/dL Final  . Albumin 02/27/2015 3.4* 3.5 - 5.0 g/dL Final  . AST 02/27/2015 14* 15 - 41 U/L Final  . ALT 02/27/2015 9* 14 - 54 U/L Final  . Alkaline Phosphatase 02/27/2015 65  38 - 126 U/L Final  . Total Bilirubin 02/27/2015 0.5  0.3 - 1.2 mg/dL Final  . GFR calc non Af Amer 02/27/2015 35* >60 mL/min Final  . GFR calc Af Amer 02/27/2015 40* >60 mL/min Final   Comment: (NOTE) The eGFR has been calculated using the CKD EPI equation. This calculation has not been validated in all clinical situations. eGFR's persistently <60 mL/min signify possible Chronic Kidney Disease.   . Anion gap 02/27/2015 8  5 - 15 Final  . WBC 02/27/2015 4.8  4.0 - 10.5 K/uL Final  . RBC 02/27/2015 2.92* 3.87 - 5.11 MIL/uL Final  . Hemoglobin 02/27/2015 9.9* 12.0 - 15.0 g/dL Final  . HCT 02/27/2015 30.2* 36.0 - 46.0 % Final  . MCV 02/27/2015 103.4* 78.0 - 100.0 fL Final  . MCH 02/27/2015 33.9  26.0 - 34.0 pg Final  . MCHC 02/27/2015 32.8  30.0 - 36.0 g/dL Final  . RDW 02/27/2015 13.7  11.5 - 15.5 % Final  . Platelets  02/27/2015 167  150 - 400 K/uL Final    Ct Abdomen Pelvis Wo Contrast  02/26/2015  CLINICAL DATA:   Acute generalized abdominal pain for 2 weeks. EXAM: CT ABDOMEN AND PELVIS WITHOUT CONTRAST TECHNIQUE: Multidetector CT imaging of the abdomen and pelvis was performed following the standard protocol without IV contrast. COMPARISON:  CT scan of December 27, 2013. FINDINGS: Visualized lung bases appear normal. Status post left hip arthroplasty. Status post cholecystectomy. No focal abnormality is noted in the liver, spleen or pancreas is noted on these unenhanced images. Atherosclerosis of abdominal aorta is noted without aneurysm formation. Horseshoe configuration of the kidneys is noted. Adrenal glands appear normal. No hydronephrosis or renal obstruction is noted. No renal or ureteral calculi are noted. Mildly dilated sigmoid colon is noted which appears to be due to ileus. No volvulus is noted. No abnormal fluid collection is noted. Urinary bladder appears normal. Status post hysterectomy. Ovaries appear normal. No significant adenopathy is noted. IMPRESSION: Atherosclerosis of abdominal aorta is noted without aneurysm formation. Horseshoe configuration of the kidneys is noted. No hydronephrosis or renal obstruction is noted. No renal or ureteral calculi are noted. Dilated and air-filled sigmoid colon is noted most consistent with ileus. There is no evidence of volvulus or obstruction. Electronically Signed   By: Marijo Conception, M.D.   On: 02/26/2015 14:54   Dg Ribs Unilateral Left  02/27/2015  CLINICAL DATA:  Left lateral rib pain for 2 days. EXAM: LEFT RIBS - 2 VIEW COMPARISON:  01/05/2014 FINDINGS: No visible rib fractures. No pneumothorax or effusion. Visualized left lung clear. Cardiomegaly. IMPRESSION: No visible rib fracture. Electronically Signed   By: Rolm Baptise M.D.   On: 02/27/2015 09:36   Dg Abd 1 View  03/11/2015  CLINICAL DATA:  81 year old female with abdominal pain nausea vomiting, abdominal distention. Initial encounter. EXAM: ABDOMEN - 1 VIEW COMPARISON:  CT Abdomen and Pelvis 03/09/2015,  and earlier FINDINGS: Supine view. Continued Stable gas pattern compared to the recent CT and radiographs with gaseous distension of redundant sigmoid colon. No upstream dilated bowel loops. No definite pneumoperitoneum. Stable cholecystectomy clips. Stable visualized osseous structures. Calcified aortic and iliac artery atherosclerosis. IMPRESSION: Stable bowel gas pattern over this series of exams with continued gaseous distension of the redundant sigmoid colon. Favor chronic decreased colonic motility. Electronically Signed   By: Genevie Ann M.D.   On: 03/11/2015 11:44   Dg Abd 1 View  02/26/2015  CLINICAL DATA:  Initial encounter for 2 week history of abdominal pain and distention. EXAM: ABDOMEN - 1 VIEW COMPARISON:  12/27/2013. FINDINGS: Supine view the abdomen shows diffuse gaseous distention of bowel, mainly representing colon. Imaging features are similar to prior study. Surgical clips in right upper quadrant compatible with prior cholecystectomy. Bones are diffusely demineralized. IMPRESSION: Diffuse gaseous bowel distention, mainly involving colon. Imaging features may be related to chronic dysmotility, ileus, or evolving distal colonic obstruction. Electronically Signed   By: Misty Stanley M.D.   On: 02/26/2015 13:44   Ct Head Wo Contrast  02/26/2015  CLINICAL DATA:  Headache x2 weeks, dementia EXAM: CT HEAD WITHOUT CONTRAST TECHNIQUE: Contiguous axial images were obtained from the base of the skull through the vertex without intravenous contrast. COMPARISON:  MRI brain dated 01/12/2012 FINDINGS: No evidence of parenchymal hemorrhage or extra-axial fluid collection. No mass lesion, mass effect, or midline shift. No CT evidence of acute infarction. Old right basal ganglia lacunar infarct (series 2/image 14). Subcortical white matter and periventricular small vessel ischemic changes. Intracranial  atherosclerosis. Global cortical atrophy, likely age appropriate. No ventriculomegaly. The visualized  paranasal sinuses are essentially clear. The mastoid air cells are unopacified. No evidence of calvarial fracture. IMPRESSION: No evidence of acute intracranial abnormality. 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 Small vessel ischemic changes. Electronically Signed   By: Julian Hy M.D.   On: 02/26/2015 13:40   Ct Abdomen Pelvis W Contrast  03/09/2015  CLINICAL DATA:  Mid abdomen pain, constipation since yesterday. EXAM: CT ABDOMEN AND PELVIS WITH CONTRAST TECHNIQUE: Multidetector CT imaging of the abdomen and pelvis was performed using the standard protocol following bolus administration of intravenous contrast. CONTRAST:  152m OMNIPAQUE IOHEXOL 300 MG/ML  SOLN COMPARISON:  February 26, 2015, Oct 09, 2013, July 24, 2013 FINDINGS: The liver, spleen, adrenal glands are normal. Horseshoe kidney is identified unchanged. Patient is status post prior cholecystectomy. There is a low-density mass in the body of pancreas measuring 2.4 x 1.5 cm unchanged compared to prior CT of July 24, 2013. Atherosclerosis of the abdominal aorta is identified without aneurysmal dilatation. There is no abdominal lymphadenopathy. There is no small bowel obstruction or diverticulitis. The appendix is not seen but no inflammation is noted around the cecum. Mild bowel content is identified in the colon. Fluid-filled bladder is normal. Pelvic phleboliths are identified. Left hip replacement is noted. There is mild atelectasis or scar of the left lung base. There is no focal pneumonia or pleural effusion. The heart size is enlarged. There is a small pericardial effusion. Images of the bones demonstrate focal sclerosis of the superior endplate of L4 unchanged since February 2015. There is lucency in the L2 vertebral body unchanged since February 2015. IMPRESSION: There is no small bowel obstruction or diverticulitis. Mild bowel content is identified in the colon. Low-density mass in the body of pancreas measuring 2.4 x 1.5 cm  unchanged compared to prior CT of July 24, 2013. If clinically indicated, consider further evaluation with MRI of pancreas. Electronically Signed   By: WAbelardo DieselM.D.   On: 03/09/2015 14:57   Dg Abd Portable 1v  02/27/2015  CLINICAL DATA:  Abdominal distension.  Follow up ileus. EXAM: PORTABLE ABDOMEN - 1 VIEW COMPARISON:  CT abdomen and pelvis and abdominal radiograph 02/26/2015 FINDINGS: Moderate gaseous distension of the sigmoid colon is stable to slightly improved compared to yesterday's radiograph. There is a moderate amount of stool in the more proximal colon. No definite small bowel dilatation is seen. Right upper quadrant surgical clips are noted. Left hip arthroplasty is partially visualized. Bones are osteopenic. IMPRESSION: Stable to slightly improved gaseous distension of the sigmoid colon which may reflect ileus. Electronically Signed   By: ALogan BoresM.D.   On: 02/27/2015 10:21     Assessment/Plan   ICD-9-CM ICD-10-CM   1. Duodenal ulcer 532.90 K26.9    s/p hemorrhage  2. Urinary tract infection without hematuria, site unspecified 599.0 N39.0    E coli  3. Diastolic CHF, chronic (HCC) 428.32 I50.32    428.0    4. Chronic pain 338.29 G89.29   5. Benign essential HTN 401.1 I10   6. Chronic atrial fibrillation (HCC) 427.31 I48.2   7. Other constipation 564.09 K59.09   8. Chronic respiratory failure, unspecified whether with hypoxia or hypercapnia ([place]elma 518.83 J96.10   9. Anemia, iron deficiency 280.9 D50.9   10. FTT (failure to thrive) in adult 783.7 R62.7   11. Hypokalemia 276.8 E87.6   12. Dementia, without behavioral disturbance 294.20 F03.90     Repeat BMP to follow  K+  Continue calazime paste to buttocks with brief change qshift. AVOID coconut oil  Cont other meds as ordered  PT/OT/ST as ordered  GOAL: short term rehab followed by long term care Communicated with pt and nursing.  Will follow  Mirabelle Cyphers S. Perlie Gold  [facility] [address] [place], [place] [address] [phone] Cell (Monday-Friday 8 AM - 5 PM) [phone] After 5 PM and follow prompts

## 2015-03-31 ENCOUNTER — Telehealth: Payer: Self-pay | Admitting: Gastroenterology

## 2015-03-31 DIAGNOSIS — F0391 Unspecified dementia with behavioral disturbance: Secondary | ICD-10-CM | POA: Diagnosis not present

## 2015-03-31 DIAGNOSIS — G4700 Insomnia, unspecified: Secondary | ICD-10-CM | POA: Diagnosis not present

## 2015-03-31 DIAGNOSIS — F411 Generalized anxiety disorder: Secondary | ICD-10-CM | POA: Diagnosis not present

## 2015-03-31 DIAGNOSIS — F329 Major depressive disorder, single episode, unspecified: Secondary | ICD-10-CM | POA: Diagnosis not present

## 2015-03-31 NOTE — Telephone Encounter (Signed)
-----   Message from Greggory Keen, LPN sent at 92/06/69  4:54 PM EDT ----- Yes. Pick it and put per [facility] if you want. Thanks! ----- Message -----    From: Oliva Bustard    Sent: 03/29/2015   2:51 PM      To: Greggory Keen, LPN  Can I put this young lady with the PA?  ----- Message -----    From: Greggory Keen, LPN    Sent: 21/97/5883   2:28 PM      To: Oliva Bustard  Help ----- Message -----    From: Vita Barley Hvozdovic, PA-C    Sent: 03/15/2015  10:50 AM      To: Greggory Keen, LPN  Pt leaving hosp todaY. GI SIGNED OFF FRI BUT SHE NEED A F/U IN OFFICE IN AROUND 4 WEEKS. HAD ULCER. WAS [facility]. NEEDS NEW DOC.CAN YOU SET UP APPT?

## 2015-04-15 ENCOUNTER — Non-Acute Institutional Stay (SKILLED_NURSING_FACILITY): Payer: Medicare Other | Admitting: Adult Health

## 2015-04-15 DIAGNOSIS — E876 Hypokalemia: Secondary | ICD-10-CM

## 2015-04-15 DIAGNOSIS — D638 Anemia in other chronic diseases classified elsewhere: Secondary | ICD-10-CM

## 2015-04-15 DIAGNOSIS — K264 Chronic or unspecified duodenal ulcer with hemorrhage: Secondary | ICD-10-CM | POA: Diagnosis not present

## 2015-04-15 DIAGNOSIS — M159 Polyosteoarthritis, unspecified: Secondary | ICD-10-CM

## 2015-04-15 DIAGNOSIS — M150 Primary generalized (osteo)arthritis: Secondary | ICD-10-CM | POA: Diagnosis not present

## 2015-04-15 DIAGNOSIS — I25119 Atherosclerotic heart disease of native coronary artery with unspecified angina pectoris: Secondary | ICD-10-CM | POA: Diagnosis not present

## 2015-04-15 DIAGNOSIS — I5032 Chronic diastolic (congestive) heart failure: Secondary | ICD-10-CM

## 2015-04-15 DIAGNOSIS — K5901 Slow transit constipation: Secondary | ICD-10-CM | POA: Diagnosis not present

## 2015-04-15 DIAGNOSIS — M545 Low back pain, unspecified: Secondary | ICD-10-CM

## 2015-04-15 DIAGNOSIS — I482 Chronic atrial fibrillation, unspecified: Secondary | ICD-10-CM

## 2015-04-15 DIAGNOSIS — N183 Chronic kidney disease, stage 3 unspecified: Secondary | ICD-10-CM

## 2015-04-15 DIAGNOSIS — J9610 Chronic respiratory failure, unspecified whether with hypoxia or hypercapnia: Secondary | ICD-10-CM

## 2015-04-18 ENCOUNTER — Encounter: Payer: Self-pay | Admitting: Adult Health

## 2015-04-18 DIAGNOSIS — M545 Low back pain, unspecified: Secondary | ICD-10-CM | POA: Insufficient documentation

## 2015-04-18 MED ORDER — GABAPENTIN 100 MG PO CAPS: 100.0000 mg | ORAL_CAPSULE | Freq: Every day | ORAL | Status: DC

## 2015-04-18 NOTE — Progress Notes (Signed)
Patient ID: Maria Christian, female   DOB: 14-Jan-1925, 81 y.o.   MRN: [id number]    Facility: [facility]      Allergies  Allergen Reactions  . Aspirin Other (See Comments)    [name]    Chief Complaint  Patient presents with  . Medical Management of Chronic Issues    HPI:  She is a long term resident of this facility being seen for the management of her chronic illnesses. She is complaining of bilateral feet pain and burning with the left worse than right. This pain started 5 days and is not getting better. She tells me that her pain started suddenly. She does have left lower back pain present. She is also complaining of dysuria.     Past Medical History  Diagnosis Date  . Atrial fibrillation ([place])   . Altered mental status   . Encephalopathy   . Parkinson disease ([place])   . Hypertension   . GERD (gastroesophageal reflux disease)   . Hyperlipemia   . Diabetes mellitus   . Coronary artery disease   . History of adenomatous polyp of colon 06/24/99  . Enlarged heart   . DEMENTIA   . Edema of lower extremity 07/13/11    right leg more swollen than left leg  . Esophageal dysmotility 07/02/12  . Horseshoe kidney   . Pancreatic lesion 05/22/11    no further workup  per PCP/family due to age  . Anemia   . Peripheral neuropathy ([place])   . Depression   . Thrombophlebitis   . TIA (transient ischemic attack) 12/19/2012  . Sigmoid volvulus ([place]) 05/24/2013    Past Surgical History  Procedure Laterality Date  . Shoulder surgery  2001    left clavicle excision and acromioplasty  . Abdominal hysterectomy    . Cholecystectomy    . Total hip arthroplasty    . Breast surgery      2 benign tumors removed left breast  . Foot surgery      benign tumors from foot  . Esophageal dilation      several times by Dr. [name]  . Nose surgery    . Esophagogastroduodenoscopy (egd) with esophageal dilation N/A 08/01/2012    Procedure: ESOPHAGOGASTRODUODENOSCOPY (EGD) WITH  ESOPHAGEAL DILATION;  Surgeon: [name], MD;  Location: [facility];  Service: Endoscopy;  Laterality: N/A;  with c-arm savory dilators  . Flexible sigmoidoscopy N/A 05/24/2013    Procedure: FLEXIBLE SIGMOIDOSCOPY;  Surgeon: [name], MD;  Location: [facility];  Service: Endoscopy;  Laterality: N/A;  . Flexible sigmoidoscopy N/A 05/26/2013    Procedure:  flex with decompression of sigmoid volvulus;  Surgeon: [name], MD;  Location: [facility];  Service: Endoscopy;  Laterality: N/A;  . Esophagogastroduodenoscopy (egd) with propofol N/A 03/12/2015    Procedure: ESOPHAGOGASTRODUODENOSCOPY (EGD) WITH PROPOFOL;  Surgeon: [name], MD;  Location: [facility];  Service: Endoscopy;  Laterality: N/A;    VITAL SIGNS BP 148/70 mmHg  Pulse 65  Ht 5\' 7"  (1.702 m)  Wt 149 lb (67.586 kg)  BMI 23.33 kg/m2  SpO2 98%  Patient's Medications  New Prescriptions   No medications on file  Previous Medications   ACETAMINOPHEN (TYLENOL) 325 MG TABLET    Take 2 tablets (650 mg total) by mouth every 6 (six) hours as needed for mild pain.   AMLODIPINE (NORVASC) 2.5 MG TABLET    Take 1 tablet (2.5 mg total) by mouth daily.   ASPIRIN EC 81 MG  TABLET    Take 1 tablet (81 mg total) by mouth daily. Start from 03/29/15   BACLOFEN (LIORESAL) 10 MG TABLET    Take 5 mg by mouth at bedtime.   BISACODYL (DULCOLAX) 10 MG SUPPOSITORY    Place 10 mg rectally as needed for mild constipation or moderate constipation.   CARVEDILOL (COREG) 3.125 MG TABLET    Take 3.125 mg by mouth daily.   CHOLECALCIFEROL (VITAMIN D) 1000 UNITS TABLET    Take 1,000 Units by mouth daily.   CRANBERRY 475 MG CAPS    Take 1 capsule by mouth 2 (two) times daily.   DOCUSATE SODIUM (COLACE) 100 MG CAPSULE    Take 200 mg by mouth daily.   DORZOLAMIDE (TRUSOPT) 2 % OPHTHALMIC SOLUTION    Place 1 drop into both eyes 3 (three) times daily.    ESCITALOPRAM (LEXAPRO) 10 MG TABLET    Take 1 tablet (10 mg total) by mouth daily.    FERROUS SULFATE 325 (65 FE) MG TABLET    Take 325 mg by mouth daily with breakfast.   FLUTICASONE (FLONASE) 50 MCG/ACT NASAL SPRAY    Place 2 sprays into both nostrils at bedtime.   FUROSEMIDE (LASIX) 40 MG TABLET    Take 1 tablet (40 mg total) by mouth daily.   HYDROCODONE-ACETAMINOPHEN (NORCO/VICODIN) 5-325 MG TABLET    Take one tablet by mouth every 6 hours as needed for pain, NTE 3000mg  of APAP from all sources/24h   LIDOCAINE (LIDODERM) 5 %    Place 1 patch onto the skin daily. Apply 1 patch to left lower back/hip every morning.  Remove & Discard patch within 12 hours or as directed by MD   LINACLOTIDE (LINZESS) 145 MCG CAPS CAPSULE    Take 145 mcg by mouth daily.   LORATADINE (CLARITIN) 10 MG TABLET    Take 10 mg by mouth daily.   MULTIPLE VITAMINS-MINERALS (CERTAGEN PO)    Take 1 tablet by mouth daily.    ONDANSETRON (ZOFRAN) 8 MG TABLET    Take 8 mg by mouth every 8 (eight) hours as needed for nausea or vomiting.   PANTOPRAZOLE (PROTONIX) 40 MG TABLET    Take 1 tablet (40 mg total) by mouth 2 (two) times daily. Protonix 40 mg po twice daily for one month then change to 40 mg po once daily   POLYETHYL GLYCOL-PROPYL GLYCOL (SYSTANE) 0.4-0.3 % SOLN    Place 1 drop into both eyes 4 (four) times daily.    POLYETHYLENE GLYCOL (MIRALAX / GLYCOLAX) PACKET    Take 17 g by mouth daily.   POTASSIUM CHLORIDE SA (K-DUR,KLOR-CON) 20 MEQ TABLET    Take 20 mEq by mouth daily.   SENNA (SENOKOT) 8.6 MG TABS TABLET    Take 1 tablet (8.6 mg total) by mouth 2 (two) times daily.   SODIUM CHLORIDE (OCEAN) 0.65 % SOLN NASAL SPRAY    Place 1 spray into both nostrils as directed. Four times daily And every 24 hours as needed to moisturize nasal passages.   TRAVOPROST, BAK FREE, (TRAVATAN) 0.004 % SOLN OPHTHALMIC SOLUTION    Place 1 drop into both eyes at bedtime.    TRAZODONE (DESYREL) 50 MG TABLET    Take 0.5 tablets (25 mg total) by mouth at bedtime as needed for sleep.   VITAMIN B-12 (CYANOCOBALAMIN) 500 MCG  TABLET    Take 500 mcg by mouth daily.  Modified Medications   No medications on file  Discontinued Medications     SIGNIFICANT  DIAGNOSTIC EXAMS    07-16-14: right hand x-ray: no acute abnormalities   10-13-14: chest x-ray: no acute cardiopulmonary process present.   12-16-14: chest x-ray: moderate cardiomegaly; with mild pulmonary venous congestion  12-21-14: chest x-ray: no acute cardiopulmonary process   01-02-15: left hand x-ray: moderate osteoarthritis   02-10-15: kub: unremarkable abdomen exam  02-15-15: abdominal ultrasound: 1. Gallbladder not visualized. 2. No hydronephrosis no renal calculus; 3. The pancreas not well visualized; which could be related to gassy abdomen but an appearance which might represent pancreatitis in the appropriate clinical setting.   02-26-15: right lower extremity doppler: - No evidence of deep vein thrombosis involving the right lower   extremity. - No evidence of Baker&'s cyst on the right.  03-11-15: kub: Stable bowel gas pattern over this series of exams with continued gaseous distension of the redundant sigmoid colon. Favor chronic decreased colonic motility.  03-12-15: upper endoscopy: duodenal bulb 1.5-2.0 cm ulcer with clot at base. No active bleeding. Surrounding mucosa was edematous. Biopsy of the surrounding mucosa were done.     LABS REVIEWED:   07-17-14: wbc 3.8; hgb 9.7; ;hct 31.;2 mcv 108; plt 113; glucose 154; bun 21.3; creat 1.43;  Liver normal albumin 4.0; tsh 6.13; hgb a1c 6.1; uric acid 7.1  08-18-14: tsh 4.09  10-13-14: wbc 4.7; hgb 9.7; hct 31.1; mcv 105.3; plt 111; glucose 176; bun 27.7; creat 1.44; k+4.4; na++143; C02: 35; liver normal albumin 4.5 12-21-14: wbc 5.1; hgb 9.7; hct 31.2; mcv 106.7; plt 129; glucose 217; bun 27.6; creat 1.37; k+ 3.8; na++135 01-06-15: hgb a1c 6.8; chol 178; ldl 110; trig 122; hdl 44 01-19-15: wbc 8.2; hgb 9.2; hct 31.4; mcv 90.2; plt 255; glucose 181; bun 24.4; creat 1.05; k+ 4.4; na++139 02-05-15: vit  [id number]: [id number]; folic acid [id number]; iron 64  02-11-15: wbc 5.3; hgb 10.2; hct 31.2; mcv 102.3; plt 145 glucose 136; bun 36; creat 1.59; k+ 4.0; na++136; liver normal albumin 3.9; lipase 20; sed rate 40  02-13-15: urine culture: gram neg rod  02-26-15: wbc 5.5 ;hgb 10.7; hct 32.1; mcv 102.9; plt 182; glucose 160; bun 23; creat 1.28; k+ 3.4; na++141; liver normal albumin 3.9; urine culture: 20,000 colonies e-coli 02-26-14: wbc 4.8; hgb 9.9; hct 30.2; mcv 103.4; plt 167; glucose 136; bun 20; creat 1.32; k+ 3.1; na++140; liver normal albumin 3.4 phos 3.6; mag 2.0; tsh 5.235  03-09-15: wbc 5.8; hgb 10.2; hct 30.3; mcv 103.1; plt 221; glucose 137; bun 14; creat 1.07; k+ 3.2; na++139; liver normal albumin 3.7; lipase 24; urine culture: e-coli: cipro 03-12-15: wbc 4.5 hgb 7.6; hct 23.0; mcv 101.3; plt 168; glucose 138; bun 40; creat 1.24; k+ 3.0; na++141; liver normal albumin 2.8 03-15-15: wbc 5.4; hgb 8.1; hct 25.1; mcv 105.5; plt 171; glucose 144; bun 13; creat 0.96; k+ 3.2; na++141  03-18-15: wbc 5.3; hgb 7.7; hct 24.9; mcv 105; plt 188; glucose 149; bun 8.1; creat 1.12; k+ 3.6; na++143       Review of Systems  Constitutional: Negative for appetite change and fatigue.  HENT: Negative for congestion.   Respiratory: Negative for cough, chest tightness and shortness of breath.   Cardiovascular: Negative for chest pain, palpitations and leg swelling.  Gastrointestinal: Negative for nausea, abdominal pain, diarrhea and constipation.  Has dysuria  Musculoskeletal: has bilateral feet burning and pain has tenderness and pain in left lower back   Skin: Negative for pallor.  Neurological: Negative for dizziness.  Psychiatric/Behavioral: The patient is not nervous/anxious.  Physical Exam  Constitutional: No distress.  Eyes: Conjunctivae are normal.  Neck: Neck supple. No JVD present. No thyromegaly present.  Cardiovascular: Normal rate, regular rhythm and intact distal pulses.   Respiratory: Effort normal and  breath sounds normal. No respiratory distress. She has no wheezes.  Is 02 dependent  GI: Soft. Bowel sounds are normal. She exhibits no distension. There is tenderness.  Musculoskeletal: She exhibits no edema.  Able to move all extremities  Has tenderness to left flank area to left lift hip.   Lymphadenopathy:    She has no cervical adenopathy.  Neurological: She is alert.  Skin: Skin is warm and dry. She is not diaphoretic.  Psychiatric: She has a normal mood and affect.     ASSESSMENT/ PLAN:  1. Hypertension: will continue norvasc 2.5 mg daily coreg 3.15 mg daily   2. Chronic diastolic heart failure: will continue lasix 40 mg daily with k+ 20 meq daily   3. Duodenal ulcer: will continue protonix 40 mg  daily    4. UTI: will continue cranberry daily  Will get ua/c&s for her dysuria   5. Anemia: will continue iron daily hgb is 8.1  6. Chronic afib: will continue coreg 3.125 mg daily for rate control. Will begin asa 81mg  on 03-29-15.  Her plavix was stopped in the hospital   7. Chronic respiratory failure: is 02 dependent.   8. Allergic rhinitis: will continue flonase nightly and claritin 10 mg daily   9. Chronic pain due to osteoarthritis: will continue lidoderm to lower back daily; and vicodin 5/325 mg every 6 hours as needed for pain will continue baclofen 5 mg nightly for leg spasticity and will monitor   10. Depression: is stable receives benefit form lexapro 10 mg daily has trazodone 25 mg nightly as needed for sleep   11. Chronic constipation: will continue colace 200 mg daily linzess 145 mcg daily; senna twice daily miralax daily   12. Glaucoma: will continue  trusopt to both eyes three times daily; travatan to both eyes nightly   13. Low back pain with radiculopathy: will get an x-ray of L/S spine; will begin neurontin 100 mg nightly for her bilateral foot burning/pain with left worse than right.    Will check cbc; sed rate     Ok Edwards NP [facility]  [facility]  Contact [phone] Monday through Friday 8am- 5pm  After hours call [phone]

## 2015-04-19 ENCOUNTER — Encounter: Payer: Self-pay | Admitting: Internal Medicine

## 2015-04-19 ENCOUNTER — Other Ambulatory Visit (INDEPENDENT_AMBULATORY_CARE_PROVIDER_SITE_OTHER): Payer: Medicare Other

## 2015-04-19 ENCOUNTER — Encounter: Payer: Self-pay | Admitting: Physician Assistant

## 2015-04-19 ENCOUNTER — Ambulatory Visit (INDEPENDENT_AMBULATORY_CARE_PROVIDER_SITE_OTHER): Payer: Medicare Other | Admitting: Physician Assistant

## 2015-04-19 ENCOUNTER — Non-Acute Institutional Stay (SKILLED_NURSING_FACILITY): Payer: Medicare Other | Admitting: Internal Medicine

## 2015-04-19 VITALS — BP 124/68 | HR 88

## 2015-04-19 DIAGNOSIS — D509 Iron deficiency anemia, unspecified: Secondary | ICD-10-CM

## 2015-04-19 DIAGNOSIS — K269 Duodenal ulcer, unspecified as acute or chronic, without hemorrhage or perforation: Secondary | ICD-10-CM | POA: Diagnosis not present

## 2015-04-19 DIAGNOSIS — Z1612 Extended spectrum beta lactamase (ESBL) resistance: Secondary | ICD-10-CM

## 2015-04-19 DIAGNOSIS — I25119 Atherosclerotic heart disease of native coronary artery with unspecified angina pectoris: Secondary | ICD-10-CM | POA: Diagnosis not present

## 2015-04-19 DIAGNOSIS — A498 Other bacterial infections of unspecified site: Secondary | ICD-10-CM

## 2015-04-19 LAB — CBC WITH DIFFERENTIAL/PLATELET
BASOS PCT: 0.6 % (ref 0.0–3.0)
Basophils Absolute: 0 10*3/uL (ref 0.0–0.1)
EOS PCT: 6.3 % — AB (ref 0.0–5.0)
Eosinophils Absolute: 0.3 10*3/uL (ref 0.0–0.7)
HCT: 28.2 % — ABNORMAL LOW (ref 36.0–46.0)
Hemoglobin: 9.2 g/dL — ABNORMAL LOW (ref 12.0–15.0)
LYMPHS ABS: 1.1 10*3/uL (ref 0.7–4.0)
Lymphocytes Relative: 24.8 % (ref 12.0–46.0)
MCHC: 32.5 g/dL (ref 30.0–36.0)
MCV: 101.5 fl — AB (ref 78.0–100.0)
MONOS PCT: 9.2 % (ref 3.0–12.0)
Monocytes Absolute: 0.4 10*3/uL (ref 0.1–1.0)
NEUTROS ABS: 2.5 10*3/uL (ref 1.4–7.7)
Neutrophils Relative %: 59.1 % (ref 43.0–77.0)
Platelets: 127 10*3/uL — ABNORMAL LOW (ref 150.0–400.0)
RBC: 2.78 Mil/uL — ABNORMAL LOW (ref 3.87–5.11)
RDW: 15.4 % (ref 11.5–15.5)
WBC: 4.3 10*3/uL (ref 4.0–10.5)

## 2015-04-19 LAB — IBC PANEL
IRON: 109 ug/dL (ref 42–145)
SATURATION RATIOS: 31.6 % (ref 20.0–50.0)
TRANSFERRIN: 246 mg/dL (ref 212.0–360.0)

## 2015-04-19 LAB — FERRITIN: Ferritin: 34 ng/mL (ref 10.0–291.0)

## 2015-04-19 NOTE — Patient Instructions (Signed)
Continue Pantoprazole 40 mg once a day.  Your physician has requested that you go to the basement for the lab work before leaving today.

## 2015-04-19 NOTE — Assessment & Plan Note (Signed)
UTI -it is sensitive to Cipro and since pt has no constitutional sx will use Cipro since it is po;Cr is 1.12 and  calc CrCl is 38.4 ; renal dosing is 500 mg  q12 for 10 days

## 2015-04-19 NOTE — Progress Notes (Signed)
Patient ID: Maria Christian, female   DOB: 1925/01/13, 81 y.o.   MRN: [id number]     History of Present Illness: Maria Christian is a pleasant 81 year old female with a past medical history of atrial fibrillation, chronic diastolic CHF, Parkinson's disease with related dementia, hypertension, and iron deficiency anemia. She was admitted to the hospital on August 6. She had been having dark-colored stools for several days. Blood work in the emergency room found her to have a hemoglobin of 7.9 which was down from 10.21 03/09/2015. She underwent an EGD on 03/12/2015 and was found to have a large ulcer in the duodenal base with clot. She was advised to remain on a twice a day PPI for a month and then switch to once daily. She has gone back to the nursing home. Though she is slightly confused, she is able to answer questions appropriately, but she is not familiar with her medications. She states she is feeling well. She states her stools are brown and are still dark but not black. She attributes this to being on iron. She has no nausea or vomiting. She states her appetite as been very good and she is eating well. Overall she feels she is doing much better.  Past Medical History  Diagnosis Date  . Atrial fibrillation ([place])   . Altered mental status   . Encephalopathy   . Parkinson disease ([place])   . Hypertension   . GERD (gastroesophageal reflux disease)   . Hyperlipemia   . Diabetes mellitus   . Coronary artery disease   . History of adenomatous polyp of colon 06/24/99  . Enlarged heart   . DEMENTIA   . Edema of lower extremity 07/13/11    right leg more swollen than left leg  . Esophageal dysmotility 07/02/12  . Horseshoe kidney   . Pancreatic lesion 05/22/11    no further workup  per PCP/family due to age  . Anemia   . Peripheral neuropathy ([place])   . Depression   . Thrombophlebitis   . TIA (transient ischemic attack) 12/19/2012  . Sigmoid volvulus ([place]) 05/24/2013    Past Surgical History    Procedure Laterality Date  . Shoulder surgery  2001    left clavicle excision and acromioplasty  . Abdominal hysterectomy    . Cholecystectomy    . Total hip arthroplasty    . Breast surgery      2 benign tumors removed left breast  . Foot surgery      benign tumors from foot  . Esophageal dilation      several times by Dr. [name]  . Nose surgery    . Esophagogastroduodenoscopy (egd) with esophageal dilation N/A 08/01/2012    Procedure: ESOPHAGOGASTRODUODENOSCOPY (EGD) WITH ESOPHAGEAL DILATION;  Surgeon: [name], MD;  Location: [facility];  Service: Endoscopy;  Laterality: N/A;  with c-arm savory dilators  . Flexible sigmoidoscopy N/A 05/24/2013    Procedure: FLEXIBLE SIGMOIDOSCOPY;  Surgeon: [name], MD;  Location: [facility];  Service: Endoscopy;  Laterality: N/A;  . Flexible sigmoidoscopy N/A 05/26/2013    Procedure:  flex with decompression of sigmoid volvulus;  Surgeon: [name], MD;  Location: [facility];  Service: Endoscopy;  Laterality: N/A;  . Esophagogastroduodenoscopy (egd) with propofol N/A 03/12/2015    Procedure: ESOPHAGOGASTRODUODENOSCOPY (EGD) WITH PROPOFOL;  Surgeon: [name], MD;  Location: [facility];  Service: Endoscopy;  Laterality: N/A;   Family History  Problem Relation Age of Onset  . Cancer    .  Heart disease     Social History  Substance Use Topics  . Smoking status: Former Smoker    Quit date: 10/09/1964  . Smokeless tobacco: Never Used  . Alcohol Use: No   Current Outpatient Prescriptions  Medication Sig Dispense Refill  . acetaminophen (TYLENOL) 325 MG tablet Take 2 tablets (650 mg total) by mouth every 6 (six) hours as needed for mild pain. 120 tablet 11  . amLODipine (NORVASC) 2.5 MG tablet Take 1 tablet (2.5 mg total) by mouth daily. (Patient taking differently: Take 2.5 mg by mouth daily. Hold for BP<100/60 or HR<60)    . baclofen (LIORESAL) 10 MG tablet Take 5 mg by mouth at bedtime.    . carvedilol  (COREG) 3.125 MG tablet Take 3.125 mg by mouth daily.    . cholecalciferol (VITAMIN D) 1000 UNITS tablet Take 1,000 Units by mouth daily.    . Cranberry 475 MG CAPS Take 1 capsule by mouth 2 (two) times daily.    Marland Kitchen docusate sodium (COLACE) 100 MG capsule Take 200 mg by mouth daily.    . dorzolamide (TRUSOPT) 2 % ophthalmic solution Place 1 drop into both eyes 3 (three) times daily.     Marland Kitchen escitalopram (LEXAPRO) 10 MG tablet Take 1 tablet (10 mg total) by mouth daily. 90 tablet 11  . ferrous sulfate 325 (65 FE) MG tablet Take 325 mg by mouth daily with breakfast.    . fluticasone (FLONASE) 50 MCG/ACT nasal spray Place 2 sprays into both nostrils at bedtime.    . furosemide (LASIX) 40 MG tablet Take 1 tablet (40 mg total) by mouth daily. 30 tablet   . HYDROcodone-acetaminophen (NORCO/VICODIN) 5-325 MG tablet Take one tablet by mouth every 6 hours as needed for pain, NTE 3000mg  of APAP from all sources/24h 120 tablet 0  . lidocaine (LIDODERM) 5 % Place 1 patch onto the skin daily. Apply 1 patch to left lower back/hip every morning.  Remove & Discard patch within 12 hours or as directed by MD 5 patch 0  . Linaclotide (LINZESS) 145 MCG CAPS capsule Take 145 mcg by mouth daily.    Marland Kitchen loratadine (CLARITIN) 10 MG tablet Take 10 mg by mouth daily.    . Multiple Vitamins-Minerals (CERTAGEN PO) Take 1 tablet by mouth daily.     . ondansetron (ZOFRAN) 8 MG tablet Take 8 mg by mouth every 8 (eight) hours as needed for nausea or vomiting.    . pantoprazole (PROTONIX) 40 MG tablet Take 1 tablet (40 mg total) by mouth 2 (two) times daily. Protonix 40 mg po twice daily for one month then change to 40 mg po once daily (Patient taking differently: Take 40 mg by mouth daily. ) 10 tablet 0  . polyethylene glycol (MIRALAX / GLYCOLAX) packet Take 17 g by mouth daily.    . potassium chloride SA (K-DUR,KLOR-CON) 20 MEQ tablet Take 20 mEq by mouth daily.    Marland Kitchen senna (SENOKOT) 8.6 MG TABS tablet Take 1 tablet (8.6 mg total) by  mouth 2 (two) times daily. 120 each 0  . sodium chloride (OCEAN) 0.65 % SOLN nasal spray Place 1 spray into both nostrils as directed. Four times daily And every 24 hours as needed to moisturize nasal passages.    . Travoprost, BAK Free, (TRAVATAN) 0.004 % SOLN ophthalmic solution Place 1 drop into both eyes at bedtime.     . traZODone (DESYREL) 50 MG tablet Take 0.5 tablets (25 mg total) by mouth at bedtime as needed for sleep.  90 tablet 11  . vitamin B-12 (CYANOCOBALAMIN) 500 MCG tablet Take 500 mcg by mouth daily.     No current facility-administered medications for this visit.   Allergies  Allergen Reactions  . Aspirin Other (See Comments)    G.I. Upset only     Review of Systems: Gen: Denies any fever, chills, sweats, anorexia, fatigue, weakness, malaise, weight loss, and sleep disorder CV: Denies chest pain, angina, palpitations, syncope, orthopnea, PND, peripheral edema, and claudication. Resp: Denies dyspnea at rest, dyspnea with exercise, cough, sputum, wheezing, coughing up blood, and pleurisy. GI: Denies vomiting blood, jaundice, and fecal incontinence.   Denies dysphagia or odynophagia. GU : Denies urinary burning, blood in urine, urinary frequency, urinary hesitancy, nocturnal urination, and urinary incontinence. MS: Denies joint pain, limitation of movement, and swelling, stiffness, low back pain, extremity pain. Denies muscle weakness, cramps, atrophy.  Derm: Denies rash, itching, dry skin, hives, moles, warts, or unhealing ulcers.  Psych: Denies depression, anxiety, memory loss, suicidal ideation, hallucinations, paranoia, and confusion. Heme: Denies bruising, bleeding, and enlarged lymph nodes. Neuro:  Denies any headaches, dizziness, paresthesia Endo:  Denies any problems with DM, thyroid, adrenal    Physical Exam: BP 124/68 mmHg  Pulse 88  Ht   Wt  General: Pleasant, well developed , elderly Caucasian female in no acute distress, pleasantly confused Head:  Normocephalic and atraumatic Eyes:  sclerae anicteric, conjunctiva pink  Ears: Normal auditory acuity Lungs: Clear throughout to auscultation Heart: Regular rate and rhythm Abdomen: Soft, non distended, non-tender. No masses, no hepatomegaly. Normal bowel sounds Musculoskeletal: Symmetrical with no gross deformities  Extremities: No edema  Neurological: Alert oriented x 4, grossly nonfocal Psychological:  Alert and cooperative. Normal mood and affect  Assessment and Recommendations:  81 year old female status post admission for GI bleed, found to have a duodenal ulcer, here for follow-up. She will continue pantoprazole 40 mg by mouth daily. A repeat CBC and iron studies will be obtained. She will follow up on an as-needed basis.       Lorrine Killilea, Vita Barley PA-C 04/19/2015,

## 2015-04-19 NOTE — Progress Notes (Signed)
MRN: [id number] Name: Maria Christian  Sex: female Age: 81 y.o. DOB: 1925-04-29  [place] #: [name] Facility/Room:220 Level Of Care: SNF Provider: Inocencio Homes D Emergency Contacts: Extended Emergency Contact Information Primary Emergency Contact: [name] [place] Phone: [phone] Relation: Son Secondary Emergency Contact: Roberts,Lynn Address: [address]          [place], [place] [address] [name] of [place] Phone: [phone] Mobile Phone: [phone] Relation: Daughter  Code Status:   Allergies: Aspirin  Chief Complaint  Patient presents with  . Acute Visit    HPI: Patient is 81 y.o. female with AF, parkinsons dx, HTN, GERD HLD, DM2 and dementia who is being seen for a urinary tract infection with ESBL E Coli. Urine was collected when pt presented with c/o dysuria.She has had no fever or MS change. No n/v/d.  Past Medical History  Diagnosis Date  . Atrial fibrillation ([place])   . Altered mental status   . Encephalopathy   . Parkinson disease ([place])   . Hypertension   . GERD (gastroesophageal reflux disease)   . Hyperlipemia   . Diabetes mellitus   . Coronary artery disease   . History of adenomatous polyp of colon 06/24/99  . Enlarged heart   . DEMENTIA   . Edema of lower extremity 07/13/11    right leg more swollen than left leg  . Esophageal dysmotility 07/02/12  . Horseshoe kidney   . Pancreatic lesion 05/22/11    no further workup  per PCP/family due to age  . Anemia   . Peripheral neuropathy ([place])   . Depression   . Thrombophlebitis   . TIA (transient ischemic attack) 12/19/2012  . Sigmoid volvulus ([place]) 05/24/2013    Past Surgical History  Procedure Laterality Date  . Shoulder surgery  2001    left clavicle excision and acromioplasty  . Abdominal hysterectomy    . Cholecystectomy    . Total hip arthroplasty    . Breast surgery      2 benign tumors removed left breast  . Foot surgery      benign tumors  from foot  . Esophageal dilation      several times by Dr. [name]  . Nose surgery    . Esophagogastroduodenoscopy (egd) with esophageal dilation N/A 08/01/2012    Procedure: ESOPHAGOGASTRODUODENOSCOPY (EGD) WITH ESOPHAGEAL DILATION;  Surgeon: [name], MD;  [facility];  Service: Endoscopy;  Laterality: N/A;  with c-arm savory dilators  . Flexible sigmoidoscopy N/A 05/24/2013    Procedure: FLEXIBLE SIGMOIDOSCOPY;  Surgeon: [name], MD;  [facility];  Service: Endoscopy;  Laterality: N/A;  . Flexible sigmoidoscopy N/A 05/26/2013    Procedure:  flex with decompression of sigmoid volvulus;  Surgeon: [name], MD;  [facility];  Service: Endoscopy;  Laterality: N/A;  . Esophagogastroduodenoscopy (egd) with propofol N/A 03/12/2015    Procedure: ESOPHAGOGASTRODUODENOSCOPY (EGD) WITH PROPOFOL;  Surgeon: [name], MD;  [facility];  Service: Endoscopy;  Laterality: N/A;      Medication List       This list is accurate as of: 04/19/15 11:59 PM.  Always use your most recent med list.               acetaminophen 325 MG tablet  Commonly known as:  TYLENOL  Take 2 tablets (650 mg total) by mouth every 6 (six) hours as needed for mild pain.     amLODipine 2.5 MG tablet  Commonly known as:  NORVASC  Take 1 tablet (2.5 mg total) by mouth daily.     baclofen 10 MG tablet  Commonly known as:  LIORESAL  Take 5 mg by mouth at bedtime.     carvedilol 3.125 MG tablet  Commonly known as:  COREG  Take 3.125 mg by mouth daily.     CERTAGEN PO  Take 1 tablet by mouth daily.     cholecalciferol 1000 UNITS tablet  Commonly known as:  VITAMIN D  Take 1,000 Units by mouth daily.     Cranberry 475 MG Caps  Take 1 capsule by mouth 2 (two) times daily.     docusate sodium 100 MG capsule  Commonly known as:  COLACE  Take 200 mg by mouth daily.     dorzolamide 2 % ophthalmic solution  Commonly known as:  TRUSOPT  Place 1  drop into both eyes 3 (three) times daily.     escitalopram 10 MG tablet  Commonly known as:  LEXAPRO  Take 1 tablet (10 mg total) by mouth daily.     ferrous sulfate 325 (65 FE) MG tablet  Take 325 mg by mouth daily with breakfast.     fluticasone 50 MCG/ACT nasal spray  Commonly known as:  FLONASE  Place 2 sprays into both nostrils at bedtime.     furosemide 40 MG tablet  Commonly known as:  LASIX  Take 1 tablet (40 mg total) by mouth daily.     HYDROcodone-acetaminophen 5-325 MG tablet  Commonly known as:  NORCO/VICODIN  Take one tablet by mouth every 6 hours as needed for pain, NTE 3000mg  of APAP from all sources/24h     lidocaine 5 %  Commonly known as:  LIDODERM  Place 1 patch onto the skin daily. Apply 1 patch to left lower back/hip every morning.  Remove & Discard patch within 12 hours or as directed by MD     LINZESS 145 MCG Caps capsule  Generic drug:  Linaclotide  Take 145 mcg by mouth daily.     loratadine 10 MG tablet  Commonly known as:  CLARITIN  Take 10 mg by mouth daily.     ondansetron 8 MG tablet  Commonly known as:  ZOFRAN  Take 8 mg by mouth every 8 (eight) hours as needed for nausea or vomiting.     pantoprazole 40 MG tablet  Commonly known as:  PROTONIX  Take 1 tablet (40 mg total) by mouth 2 (two) times daily. Protonix 40 mg po twice daily for one month then change to 40 mg po once daily     polyethylene glycol packet  Commonly known as:  MIRALAX / GLYCOLAX  Take 17 g by mouth daily.     potassium chloride SA 20 MEQ tablet  Commonly known as:  K-DUR,KLOR-CON  Take 20 mEq by mouth daily.     senna 8.6 MG Tabs tablet  Commonly known as:  SENOKOT  Take 1 tablet (8.6 mg total) by mouth 2 (two) times daily.     sodium chloride 0.65 % Soln nasal spray  Commonly known as:  OCEAN  Place 1 spray into both nostrils as directed. Four times daily And every 24 hours as needed to moisturize nasal passages.     Travoprost (BAK Free) 0.004 % Soln  ophthalmic solution  Commonly known as:  TRAVATAN  Place 1 drop into both eyes at bedtime.     traZODone 50 MG tablet  Commonly known as:  DESYREL  Take 0.5 tablets (25 mg total) by mouth at bedtime as needed for sleep.     vitamin B-12 500 MCG tablet  Commonly known as:  CYANOCOBALAMIN  Take 500 mcg by mouth daily.        No orders of the defined types were placed in this encounter.    Immunization History  Administered Date(s) Administered  . PPD Test 07/28/2013    Social History  Substance Use Topics  . Smoking status: Former Smoker    Quit date: 10/09/1964  . Smokeless tobacco: Never Used  . Alcohol Use: No    Review of Systems  DATA OBTAINED: from nurse,pt GENERAL:  no fevers, fatigue, appetite changes SKIN: No itching, rash HEENT: No complaint RESPIRATORY: No cough, wheezing, SOB CARDIAC: No chest pain, palpitations, lower extremity edema  GI: No abdominal pain, No N/V/D or constipation, No heartburn or reflux  GU: + dysuria onset several days ago,+ suprapubic pain ,no  frequency or urgency, or incontinence  MUSCULOSKELETAL: No unrelieved bone/joint pain NEUROLOGIC: No headache, dizziness  PSYCHIATRIC: No overt anxiety or sadness  Filed Vitals:   04/19/15 1333  BP: 122/61  Pulse: 56  Resp: 18    Physical Exam  GENERAL APPEARANCE: Alert, conversant, No acute distress  SKIN: No diaphoresis rash HEENT: Unremarkable RESPIRATORY: Breathing is even, unlabored. Lung sounds are clear   CARDIOVASCULAR: Heart RRR no murmurs, rubs or gallops. No peripheral edema  GASTROINTESTINAL: Abdomen is soft, non-tender, not distended w/ normal bowel sounds.  GENITOURINARY: Bladder non tender, not distended  MUSCULOSKELETAL: No abnormal joints or musculature NEUROLOGIC: Cranial nerves 2-12 grossly intact. Moves all extremities PSYCHIATRIC: Mood and affect appropriate to situation, pleasantly confused, no behavioral issues  Patient Active Problem List   Diagnosis Date  Noted  . Infection due to ESBL-producing Escherichia coli 04/19/2015  . Low back pain potentially associated with radiculopathy 04/18/2015  . Chronic respiratory failure ([place]) 03/16/2015  . Allergic rhinitis 03/16/2015  . Chronic pain 03/16/2015  . Osteoarthritis of multiple joints 03/16/2015  . Duodenal ulcer hemorrhage 03/12/2015  . GI bleed 03/11/2015  . Melena 03/11/2015  . Pressure ulcer 03/11/2015  . Headache 03/09/2015  . Abdominal distension 03/01/2015  . Cephalgia 03/01/2015  . Nausea and vomiting 02/27/2015  . Constipation 02/27/2015  . Benign essential HTN 02/27/2015  . Anemia, iron deficiency 02/27/2015  . Anemia of chronic disease 02/27/2015  . Chronic atrial fibrillation ([place]) 02/27/2015  . Hypokalemia 02/27/2015  . CKD (chronic kidney disease) stage 3, GFR 30-59 ml/min 02/27/2015  . Ileus ([place]) 02/26/2015  . Malnutrition of moderate degree ([place]) 10/10/2013  . FTT (failure to thrive) in adult 07/29/2013  . Dementia 12/19/2012  . Depression 12/19/2012  . Coronary artery disease   . Diastolic CHF, chronic ([place]) 05/24/2011    CBC    Component Value Date/Time   WBC 4.3 04/19/2015 1405   WBC 3.8 07/17/2014   RBC 2.78* 04/19/2015 1405   RBC 2.33* 10/17/2013 0342   HGB 9.2* 04/19/2015 1405   HCT 28.2* 04/19/2015 1405   PLT 127.0* 04/19/2015 1405   MCV 101.5* 04/19/2015 1405   LYMPHSABS 1.1 04/19/2015 1405   MONOABS 0.4 04/19/2015 1405   EOSABS 0.3 04/19/2015 1405   BASOSABS 0.0 04/19/2015 1405    CMP     Component Value Date/Time   NA 141 03/15/2015 0522   NA 146 09/04/2014   K 3.2* 03/15/2015 0522   CL 116* 03/15/2015 0522   CO2 20* 03/15/2015 0522   GLUCOSE 144* 03/15/2015 0522  BUN 13 03/15/2015 0522   BUN 24* 09/04/2014   CREATININE 0.96 03/15/2015 0522   CREATININE 1.4* 09/04/2014   CALCIUM 8.3* 03/15/2015 0522   PROT 5.6* 03/12/2015 0525   ALBUMIN 2.8* 03/12/2015 0525   AST 12* 03/12/2015 0525   ALT 6* 03/12/2015 0525   ALKPHOS 49  03/12/2015 0525   BILITOT 0.6 03/12/2015 0525   GFRNONAA 51* 03/15/2015 0522   GFRAA 59* 03/15/2015 0522    Assessment and Plan  Infection due to ESBL-producing Escherichia coli UTI -it is sensitive to Cipro and since pt has no constitutional sx will use Cipro since it is po;Cr is 1.12 and  calc CrCl is 38.4 ; renal dosing is 500 mg  q12 for 10 days   Time spent 25 min;> 50% of time with patient was spent reviewing records, labs, tests and studies, counseling and developing plan of care  Hennie Duos, MD

## 2015-04-20 NOTE — Progress Notes (Signed)
Agree with assessment and plan. Continue PPI and await blood work. She should be avoiding all NSAIDs at this time. She should be tested for H pylori if not already done.

## 2015-04-21 DIAGNOSIS — E119 Type 2 diabetes mellitus without complications: Secondary | ICD-10-CM | POA: Diagnosis not present

## 2015-04-21 DIAGNOSIS — H01001 Unspecified blepharitis right upper eyelid: Secondary | ICD-10-CM | POA: Diagnosis not present

## 2015-04-21 DIAGNOSIS — H401134 Primary open-angle glaucoma, bilateral, indeterminate stage: Secondary | ICD-10-CM | POA: Diagnosis not present

## 2015-04-21 DIAGNOSIS — H01004 Unspecified blepharitis left upper eyelid: Secondary | ICD-10-CM | POA: Diagnosis not present

## 2015-04-21 DIAGNOSIS — H04123 Dry eye syndrome of bilateral lacrimal glands: Secondary | ICD-10-CM | POA: Diagnosis not present

## 2015-04-23 ENCOUNTER — Telehealth: Payer: Self-pay | Admitting: *Deleted

## 2015-04-23 NOTE — Telephone Encounter (Signed)
-----   Message from [employer], PA-C sent at 04/22/2015  8:06 PM EST ----- Can you have pt do h pylori stool antigen test please? Thanks.

## 2015-04-26 ENCOUNTER — Telehealth: Payer: Self-pay | Admitting: *Deleted

## 2015-04-26 DIAGNOSIS — K269 Duodenal ulcer, unspecified as acute or chronic, without hemorrhage or perforation: Secondary | ICD-10-CM

## 2015-04-26 NOTE — Telephone Encounter (Signed)
Spoke with patient's guardian and she is at [facility] on [address]. [employer] Living [phone].

## 2015-04-26 NOTE — Telephone Encounter (Signed)
error 

## 2015-04-26 NOTE — Telephone Encounter (Signed)
Per guardian patient lives at [facility].[phone])

## 2015-04-26 NOTE — Telephone Encounter (Signed)
Left a message at [facility] for patients nurse to call me.

## 2015-04-26 NOTE — Telephone Encounter (Signed)
-----   Message from Vita Barley Hvozdovic, PA-C sent at 04/25/2015  8:42 PM EST ----- No they didn't do it. ----- Message -----    From: Hulan Saas, RN    Sent: 04/23/2015  10:58 AM      To: Vita Barley Hvozdovic, PA-C  Cecille Rubin, I see that this patient had an upper endo on 03/12/15 and she had biopsies. Would H. Pylori have been tested then? Kaithlyn Teagle ----- Message -----    From: Vita Barley Hvozdovic, PA-C    Sent: 04/22/2015   8:06 PM      To: Hulan Saas, RN  Can you have pt do h pylori stool antigen test please? Thanks.

## 2015-04-26 NOTE — Telephone Encounter (Signed)
-----   Message from Vita Barley Hvozdovic, PA-C sent at 04/25/2015  8:42 PM EST ----- No they didn't do it. ----- Message -----    From: Hulan Saas, RN    Sent: 04/23/2015  10:58 AM      To: Vita Barley Hvozdovic, PA-C  Cecille Rubin, I see that this patient had an upper endo on 03/12/15 and she had biopsies. Would H. Pylori have been tested then? Eden Rho ----- Message -----    From: Vita Barley Hvozdovic, PA-C    Sent: 04/22/2015   8:06 PM      To: Hulan Saas, RN  Can you have pt do h pylori stool antigen test please? Thanks.

## 2015-04-27 NOTE — Telephone Encounter (Signed)
Spoke with patient's nurse at [facility] and faxed order for H. Pylori stool antigen to [phone].

## 2015-05-10 ENCOUNTER — Encounter: Payer: Self-pay | Admitting: Internal Medicine

## 2015-05-10 ENCOUNTER — Non-Acute Institutional Stay (SKILLED_NURSING_FACILITY): Payer: Medicare Other | Admitting: Internal Medicine

## 2015-05-10 DIAGNOSIS — S30810A Abrasion of lower back and pelvis, initial encounter: Secondary | ICD-10-CM | POA: Diagnosis not present

## 2015-05-10 DIAGNOSIS — B372 Candidiasis of skin and nail: Secondary | ICD-10-CM | POA: Diagnosis not present

## 2015-05-10 NOTE — Assessment & Plan Note (Signed)
Shearing of skin, I believe 2/2 yeast infection; have asked wound care nurse to look at it; have started clotrimazole

## 2015-05-10 NOTE — Assessment & Plan Note (Signed)
Intertrigo in gluteal fold and labia, perineum; clotrimazole cream BID; this is root cause of shearing to skin noted

## 2015-05-10 NOTE — Progress Notes (Signed)
MRN: [id number] Name: Maria Christian  Sex: female Age: 81 y.o. DOB: February 10, 1925  [place] #: [name] Facility/Room: Level Of Care: SNF Provider: Inocencio Homes D Emergency Contacts: Extended Emergency Contact Information Primary Emergency Contact: [name] [place] of [place] Phone: [phone] Relation: Son Secondary Emergency Contact: Roberts,Lynn Address: [address]          [place], [place] [address] [name] of [place] Phone: [phone] Mobile Phone: [phone] Relation: Daughter  Code Status:   Allergies: Aspirin  Chief Complaint  Patient presents with  . Acute Visit    HPI: Patient is 81 y.o. female with AFib, Parkinsons dx,HTN, GERD, DM2, HLD, CAD, and dementia who is being seen acutely today for rashin gluteal fols and GU area. Admits itches and hurts. Noted past few days. No urinary sx, no MS changed, no fever.Worse when she is sitting on it. A little better with cream on it.  Past Medical History  Diagnosis Date  . Atrial fibrillation ([place])   . Altered mental status   . Encephalopathy   . Parkinson disease ([place])   . Hypertension   . GERD (gastroesophageal reflux disease)   . Hyperlipemia   . Diabetes mellitus   . Coronary artery disease   . History of adenomatous polyp of colon 06/24/99  . Enlarged heart   . DEMENTIA   . Edema of lower extremity 07/13/11    right leg more swollen than left leg  . Esophageal dysmotility 07/02/12  . Horseshoe kidney   . Pancreatic lesion 05/22/11    no further workup  per PCP/family due to age  . Anemia   . Peripheral neuropathy ([place])   . Depression   . Thrombophlebitis   . TIA (transient ischemic attack) 12/19/2012  . Sigmoid volvulus ([place]) 05/24/2013    Past Surgical History  Procedure Laterality Date  . Shoulder surgery  2001    left clavicle excision and acromioplasty  . Abdominal hysterectomy    . Cholecystectomy    . Total hip arthroplasty    . Breast surgery      2 benign tumors  removed left breast  . Foot surgery      benign tumors from foot  . Esophageal dilation      several times by Dr. [name]  . Nose surgery    . Esophagogastroduodenoscopy (egd) with esophageal dilation N/A 08/01/2012    Procedure: ESOPHAGOGASTRODUODENOSCOPY (EGD) WITH ESOPHAGEAL DILATION;  Surgeon: [name], MD;  Location: [facility];  Service: Endoscopy;  Laterality: N/A;  with c-arm savory dilators  . Flexible sigmoidoscopy N/A 05/24/2013    Procedure: FLEXIBLE SIGMOIDOSCOPY;  Surgeon: [name], MD;  Location: [facility];  Service: Endoscopy;  Laterality: N/A;  . Flexible sigmoidoscopy N/A 05/26/2013    Procedure:  flex with decompression of sigmoid volvulus;  Surgeon: [name], MD;  [facility];  Service: Endoscopy;  Laterality: N/A;  . Esophagogastroduodenoscopy (egd) with propofol N/A 03/12/2015    Procedure: ESOPHAGOGASTRODUODENOSCOPY (EGD) WITH PROPOFOL;  Surgeon: [name], MD;  [facility];  Service: Endoscopy;  Laterality: N/A;      Medication List       This list is accurate as of: 05/10/15  8:19 PM.  Always use your most recent med list.               acetaminophen 325 MG tablet  Commonly known as:  TYLENOL  Take 2 tablets (650 mg total) by mouth every 6 (six) hours as needed  for mild pain.     amLODipine 2.5 MG tablet  Commonly known as:  NORVASC  Take 1 tablet (2.5 mg total) by mouth daily.     baclofen 10 MG tablet  Commonly known as:  LIORESAL  Take 5 mg by mouth at bedtime.     carvedilol 3.125 MG tablet  Commonly known as:  COREG  Take 3.125 mg by mouth daily.     CERTAGEN PO  Take 1 tablet by mouth daily.     cholecalciferol 1000 UNITS tablet  Commonly known as:  VITAMIN D  Take 1,000 Units by mouth daily.     Cranberry 475 MG Caps  Take 1 capsule by mouth 2 (two) times daily.     docusate sodium 100 MG capsule  Commonly known as:  COLACE  Take 200 mg by mouth daily.     dorzolamide 2 %  ophthalmic solution  Commonly known as:  TRUSOPT  Place 1 drop into both eyes 3 (three) times daily.     escitalopram 10 MG tablet  Commonly known as:  LEXAPRO  Take 1 tablet (10 mg total) by mouth daily.     ferrous sulfate 325 (65 FE) MG tablet  Take 325 mg by mouth daily with breakfast.     fluticasone 50 MCG/ACT nasal spray  Commonly known as:  FLONASE  Place 2 sprays into both nostrils at bedtime.     furosemide 40 MG tablet  Commonly known as:  LASIX  Take 1 tablet (40 mg total) by mouth daily.     HYDROcodone-acetaminophen 5-325 MG tablet  Commonly known as:  NORCO/VICODIN  Take one tablet by mouth every 6 hours as needed for pain, NTE 3000mg  of APAP from all sources/24h     lidocaine 5 %  Commonly known as:  LIDODERM  Place 1 patch onto the skin daily. Apply 1 patch to left lower back/hip every morning.  Remove & Discard patch within 12 hours or as directed by MD     LINZESS 145 MCG Caps capsule  Generic drug:  Linaclotide  Take 145 mcg by mouth daily.     loratadine 10 MG tablet  Commonly known as:  CLARITIN  Take 10 mg by mouth daily.     ondansetron 8 MG tablet  Commonly known as:  ZOFRAN  Take 8 mg by mouth every 8 (eight) hours as needed for nausea or vomiting.     pantoprazole 40 MG tablet  Commonly known as:  PROTONIX  Take 1 tablet (40 mg total) by mouth 2 (two) times daily. Protonix 40 mg po twice daily for one month then change to 40 mg po once daily     polyethylene glycol packet  Commonly known as:  MIRALAX / GLYCOLAX  Take 17 g by mouth daily.     potassium chloride SA 20 MEQ tablet  Commonly known as:  K-DUR,KLOR-CON  Take 20 mEq by mouth daily.     senna 8.6 MG Tabs tablet  Commonly known as:  SENOKOT  Take 1 tablet (8.6 mg total) by mouth 2 (two) times daily.     sodium chloride 0.65 % Soln nasal spray  Commonly known as:  OCEAN  Place 1 spray into both nostrils as directed. Four times daily And every 24 hours as needed to moisturize  nasal passages.     Travoprost (BAK Free) 0.004 % Soln ophthalmic solution  Commonly known as:  TRAVATAN  Place 1 drop into both eyes at bedtime.  traZODone 50 MG tablet  Commonly known as:  DESYREL  Take 0.5 tablets (25 mg total) by mouth at bedtime as needed for sleep.     vitamin B-12 500 MCG tablet  Commonly known as:  CYANOCOBALAMIN  Take 500 mcg by mouth daily.        No orders of the defined types were placed in this encounter.    Immunization History  Administered Date(s) Administered  . PPD Test 07/28/2013    Social History  Substance Use Topics  . Smoking status: Former Smoker    Quit date: 10/09/1964  . Smokeless tobacco: Never Used  . Alcohol Use: No    Review of Systems  DATA OBTAINED: from patient, nurse- as per HPI GENERAL:  no fevers, fatigue, appetite changes SKIN: itching, pain in GU area and bottom HEENT: No complaint RESPIRATORY: No cough, wheezing, SOB CARDIAC: No chest pain, palpitations, lower extremity edema  GI: No abdominal pain, No N/V/D or constipation, No heartburn or reflux  GU: No dysuria, frequency or urgency, or incontinence  MUSCULOSKELETAL: No unrelieved bone/joint pain NEUROLOGIC: No headache, dizziness  PSYCHIATRIC: No overt anxiety or sadness  Filed Vitals:   05/10/15 1954  BP: 122/68  Pulse: 74  Temp: 98.3 F (36.8 C)  Resp: 18    Physical Exam  GENERAL APPEARANCE: Alert, conversant, No acute distress  SKIN: in gluteal fold pink and raised c/w yeast with mild thin shearing of skin in a few areas HEENT: Unremarkable RESPIRATORY: Breathing is even, unlabored. Lung sounds are clear   CARDIOVASCULAR: Heart RRR no murmurs, rubs or gallops. No peripheral edema  GASTROINTESTINAL: Abdomen is soft, non-tender, not distended w/ normal bowel sounds.  GENITOURINARY: Bladder non tender, not distendedlabia and perineum with mild redness and swelling, no open areas noted  MUSCULOSKELETAL: No abnormal joints or  musculature NEUROLOGIC: Cranial nerves 2-12 grossly intact. Moves all extremities PSYCHIATRIC: Mood and affect appropriate to situation with dementia, no behavioral issues  Patient Active Problem List   Diagnosis Date Noted  . Skin yeast infection 05/10/2015  . Abrasion of buttock 05/10/2015  . Infection due to ESBL-producing Escherichia coli 04/19/2015  . Low back pain potentially associated with radiculopathy 04/18/2015  . Chronic respiratory failure ([place]) 03/16/2015  . Allergic rhinitis 03/16/2015  . Chronic pain 03/16/2015  . Osteoarthritis of multiple joints 03/16/2015  . Duodenal ulcer hemorrhage 03/12/2015  . GI bleed 03/11/2015  . Melena 03/11/2015  . Pressure ulcer 03/11/2015  . Headache 03/09/2015  . Abdominal distension 03/01/2015  . Cephalgia 03/01/2015  . Nausea and vomiting 02/27/2015  . Constipation 02/27/2015  . Benign essential HTN 02/27/2015  . Anemia, iron deficiency 02/27/2015  . Anemia of chronic disease 02/27/2015  . Chronic atrial fibrillation ([place]) 02/27/2015  . Hypokalemia 02/27/2015  . CKD (chronic kidney disease) stage 3, GFR 30-59 ml/min 02/27/2015  . Ileus ([place]) 02/26/2015  . Malnutrition of moderate degree ([place]) 10/10/2013  . FTT (failure to thrive) in adult 07/29/2013  . Dementia 12/19/2012  . Depression 12/19/2012  . Coronary artery disease   . Diastolic CHF, chronic ([place]) 05/24/2011    CBC    Component Value Date/Time   WBC 4.3 04/19/2015 1405   WBC 3.8 07/17/2014   RBC 2.78* 04/19/2015 1405   RBC 2.33* 10/17/2013 0342   HGB 9.2* 04/19/2015 1405   HCT 28.2* 04/19/2015 1405   PLT 127.0* 04/19/2015 1405   MCV 101.5* 04/19/2015 1405   LYMPHSABS 1.1 04/19/2015 1405   MONOABS 0.4 04/19/2015 1405   EOSABS 0.3  04/19/2015 1405   BASOSABS 0.0 04/19/2015 1405    CMP     Component Value Date/Time   NA 141 03/15/2015 0522   NA 146 09/04/2014   K 3.2* 03/15/2015 0522   CL 116* 03/15/2015 0522   CO2 20* 03/15/2015 0522   GLUCOSE 144*  03/15/2015 0522   BUN 13 03/15/2015 0522   BUN 24* 09/04/2014   CREATININE 0.96 03/15/2015 0522   CREATININE 1.4* 09/04/2014   CALCIUM 8.3* 03/15/2015 0522   PROT 5.6* 03/12/2015 0525   ALBUMIN 2.8* 03/12/2015 0525   AST 12* 03/12/2015 0525   ALT 6* 03/12/2015 0525   ALKPHOS 49 03/12/2015 0525   BILITOT 0.6 03/12/2015 0525   GFRNONAA 51* 03/15/2015 0522   GFRAA 59* 03/15/2015 0522    Assessment and Plan  Skin yeast infection Intertrigo in gluteal fold and labia, perineum; clotrimazole cream BID; this is root cause of shearing to skin noted  Abrasion of buttock Shearing of skin, I believe 2/2 yeast infection; have asked wound care nurse to look at it; have started clotrimazole   Time spent  25 min;> 50% of time with patient was spent reviewing records, labs, tests and studies, counseling and developing plan of care  Hennie Duos, MD

## 2015-05-11 ENCOUNTER — Telehealth: Payer: Self-pay | Admitting: *Deleted

## 2015-05-11 NOTE — Telephone Encounter (Signed)
-----   Message from Hulan Saas, RN sent at 04/27/2015  2:45 PM EST ----- Did we get results from [facility] (h. Pylori stool antigen) LH

## 2015-05-11 NOTE — Telephone Encounter (Signed)
Received fax and h. Pylori is negative.

## 2015-05-11 NOTE — Telephone Encounter (Signed)
Spoke with Dionne at [facility] and the test has been done and is negative. She will fax [name] the results.

## 2015-05-19 DIAGNOSIS — G4700 Insomnia, unspecified: Secondary | ICD-10-CM | POA: Diagnosis not present

## 2015-05-19 DIAGNOSIS — F411 Generalized anxiety disorder: Secondary | ICD-10-CM | POA: Diagnosis not present

## 2015-05-19 DIAGNOSIS — F329 Major depressive disorder, single episode, unspecified: Secondary | ICD-10-CM | POA: Diagnosis not present

## 2015-05-19 DIAGNOSIS — F0391 Unspecified dementia with behavioral disturbance: Secondary | ICD-10-CM | POA: Diagnosis not present

## 2015-06-10 DIAGNOSIS — M79675 Pain in left toe(s): Secondary | ICD-10-CM | POA: Diagnosis not present

## 2015-06-10 DIAGNOSIS — M79674 Pain in right toe(s): Secondary | ICD-10-CM | POA: Diagnosis not present

## 2015-06-10 DIAGNOSIS — E1159 Type 2 diabetes mellitus with other circulatory complications: Secondary | ICD-10-CM | POA: Diagnosis not present

## 2015-06-10 DIAGNOSIS — B351 Tinea unguium: Secondary | ICD-10-CM | POA: Diagnosis not present

## 2015-06-30 ENCOUNTER — Non-Acute Institutional Stay (SKILLED_NURSING_FACILITY): Payer: Medicare Other | Admitting: Adult Health

## 2015-06-30 DIAGNOSIS — F32A Depression, unspecified: Secondary | ICD-10-CM

## 2015-06-30 DIAGNOSIS — F329 Major depressive disorder, single episode, unspecified: Secondary | ICD-10-CM | POA: Diagnosis not present

## 2015-06-30 DIAGNOSIS — I482 Chronic atrial fibrillation, unspecified: Secondary | ICD-10-CM

## 2015-06-30 DIAGNOSIS — J301 Allergic rhinitis due to pollen: Secondary | ICD-10-CM | POA: Diagnosis not present

## 2015-06-30 DIAGNOSIS — I5032 Chronic diastolic (congestive) heart failure: Secondary | ICD-10-CM | POA: Diagnosis not present

## 2015-06-30 DIAGNOSIS — M545 Low back pain, unspecified: Secondary | ICD-10-CM

## 2015-06-30 DIAGNOSIS — K264 Chronic or unspecified duodenal ulcer with hemorrhage: Secondary | ICD-10-CM

## 2015-06-30 DIAGNOSIS — J9610 Chronic respiratory failure, unspecified whether with hypoxia or hypercapnia: Secondary | ICD-10-CM

## 2015-06-30 DIAGNOSIS — I10 Essential (primary) hypertension: Secondary | ICD-10-CM | POA: Diagnosis not present

## 2015-06-30 DIAGNOSIS — D509 Iron deficiency anemia, unspecified: Secondary | ICD-10-CM

## 2015-08-02 ENCOUNTER — Non-Acute Institutional Stay (SKILLED_NURSING_FACILITY): Payer: Medicare Other | Admitting: Internal Medicine

## 2015-08-02 ENCOUNTER — Encounter: Payer: Self-pay | Admitting: Internal Medicine

## 2015-08-02 DIAGNOSIS — I5032 Chronic diastolic (congestive) heart failure: Secondary | ICD-10-CM

## 2015-08-02 DIAGNOSIS — I482 Chronic atrial fibrillation, unspecified: Secondary | ICD-10-CM

## 2015-08-02 DIAGNOSIS — I25119 Atherosclerotic heart disease of native coronary artery with unspecified angina pectoris: Secondary | ICD-10-CM

## 2015-08-02 NOTE — Progress Notes (Signed)
MRN: [id number] Name: BLAYKLEE DAMBROSIA  Sex: female Age: 82 y.o. DOB: 1925/01/01  [place] #: [name] Facility/Room: Level Of Care: SNF Provider: Inocencio Homes D Emergency Contacts: Extended Emergency Contact Information Primary Emergency Contact: [name] States of [place] Phone: [phone] Relation: Son Secondary Emergency Contact: Roberts,Lynn Address: [address]          [place], [place] [address] [name] of [place] Phone: [phone] Mobile Phone: [phone] Relation: Daughter  Code Status:   Allergies: Aspirin  Chief Complaint  Patient presents with  . Medical Management of Chronic Issues    HPI: Patient is 82 y.o. female who is being seen for routine issues of DHF, CAD, chronic AF.  Past Medical History  Diagnosis Date  . Atrial fibrillation ([place])   . Altered mental status   . Encephalopathy   . Parkinson disease ([place])   . Hypertension   . GERD (gastroesophageal reflux disease)   . Hyperlipemia   . Diabetes mellitus   . Coronary artery disease   . History of adenomatous polyp of colon 06/24/99  . Enlarged heart   . DEMENTIA   . Edema of lower extremity 07/13/11    right leg more swollen than left leg  . Esophageal dysmotility 07/02/12  . Horseshoe kidney   . Pancreatic lesion 05/22/11    no further workup  per PCP/family due to age  . Anemia   . Peripheral neuropathy ([place])   . Depression   . Thrombophlebitis   . TIA (transient ischemic attack) 12/19/2012  . Sigmoid volvulus ([place]) 05/24/2013    Past Surgical History  Procedure Laterality Date  . Shoulder surgery  2001    left clavicle excision and acromioplasty  . Abdominal hysterectomy    . Cholecystectomy    . Total hip arthroplasty    . Breast surgery      2 benign tumors removed left breast  . Foot surgery      benign tumors from foot  . Esophageal dilation      several times by Dr. [name]  . Nose surgery    . Esophagogastroduodenoscopy (egd) with  esophageal dilation N/A 08/01/2012    Procedure: ESOPHAGOGASTRODUODENOSCOPY (EGD) WITH ESOPHAGEAL DILATION;  Surgeon: [name], MD;  [facility];  Service: Endoscopy;  Laterality: N/A;  with c-arm savory dilators  . Flexible sigmoidoscopy N/A 05/24/2013    Procedure: FLEXIBLE SIGMOIDOSCOPY;  Surgeon: [name], MD;  [facility];  Service: Endoscopy;  Laterality: N/A;  . Flexible sigmoidoscopy N/A 05/26/2013    Procedure:  flex with decompression of sigmoid volvulus;  Surgeon: [name], MD;  [facility];  Service: Endoscopy;  Laterality: N/A;  . Esophagogastroduodenoscopy (egd) with propofol N/A 03/12/2015    Procedure: ESOPHAGOGASTRODUODENOSCOPY (EGD) WITH PROPOFOL;  Surgeon: [name], MD;  [facility];  Service: Endoscopy;  Laterality: N/A;      Medication List       This list is accurate as of: 08/02/15 11:59 PM.  Always use your most recent med list.               acetaminophen 325 MG tablet  Commonly known as:  TYLENOL  Take 2 tablets (650 mg total) by mouth every 6 (six) hours as needed for mild pain.     amLODipine 2.5 MG tablet  Commonly known as:  NORVASC  Take 1 tablet (2.5 mg total) by mouth daily.     baclofen 10 MG tablet  Commonly known  as:  LIORESAL  Take 5 mg by mouth at bedtime.     carvedilol 3.125 MG tablet  Commonly known as:  COREG  Take 3.125 mg by mouth daily.     CERTAGEN PO  Take 1 tablet by mouth daily.     cholecalciferol 1000 units tablet  Commonly known as:  VITAMIN D  Take 1,000 Units by mouth daily.     Cranberry 475 MG Caps  Take 1 capsule by mouth 2 (two) times daily.     docusate sodium 100 MG capsule  Commonly known as:  COLACE  Take 200 mg by mouth daily.     dorzolamide 2 % ophthalmic solution  Commonly known as:  TRUSOPT  Place 1 drop into both eyes 3 (three) times daily.     escitalopram 10 MG tablet  Commonly known as:  LEXAPRO  Take 1 tablet (10 mg total) by  mouth daily.     ferrous sulfate 325 (65 FE) MG tablet  Take 325 mg by mouth daily with breakfast.     fluticasone 50 MCG/ACT nasal spray  Commonly known as:  FLONASE  Place 2 sprays into both nostrils at bedtime.     furosemide 40 MG tablet  Commonly known as:  LASIX  Take 1 tablet (40 mg total) by mouth daily.     HYDROcodone-acetaminophen 5-325 MG tablet  Commonly known as:  NORCO/VICODIN  Take one tablet by mouth every 6 hours as needed for pain, NTE 3000mg  of APAP from all sources/24h     lidocaine 5 %  Commonly known as:  LIDODERM  Place 1 patch onto the skin daily. Apply 1 patch to left lower back/hip every morning.  Remove & Discard patch within 12 hours or as directed by MD     LINZESS 145 MCG Caps capsule  Generic drug:  Linaclotide  Take 145 mcg by mouth daily.     loratadine 10 MG tablet  Commonly known as:  CLARITIN  Take 10 mg by mouth daily.     ondansetron 8 MG tablet  Commonly known as:  ZOFRAN  Take 8 mg by mouth every 8 (eight) hours as needed for nausea or vomiting.     pantoprazole 40 MG tablet  Commonly known as:  PROTONIX  Take 1 tablet (40 mg total) by mouth 2 (two) times daily. Protonix 40 mg po twice daily for one month then change to 40 mg po once daily     polyethylene glycol packet  Commonly known as:  MIRALAX / GLYCOLAX  Take 17 g by mouth daily.     potassium chloride SA 20 MEQ tablet  Commonly known as:  K-DUR,KLOR-CON  Take 20 mEq by mouth daily.     senna 8.6 MG Tabs tablet  Commonly known as:  SENOKOT  Take 1 tablet (8.6 mg total) by mouth 2 (two) times daily.     sodium chloride 0.65 % Soln nasal spray  Commonly known as:  OCEAN  Place 1 spray into both nostrils as directed. Four times daily And every 24 hours as needed to moisturize nasal passages.     Travoprost (BAK Free) 0.004 % Soln ophthalmic solution  Commonly known as:  TRAVATAN  Place 1 drop into both eyes at bedtime.     traZODone 50 MG tablet  Commonly known as:   DESYREL  Take 0.5 tablets (25 mg total) by mouth at bedtime as needed for sleep.     vitamin B-12 500 MCG tablet  Commonly known  as:  CYANOCOBALAMIN  Take 500 mcg by mouth daily.        No orders of the defined types were placed in this encounter.    Immunization History  Administered Date(s) Administered  . PPD Test 07/28/2013    Social History  Substance Use Topics  . Smoking status: Former Smoker    Quit date: 10/09/1964  . Smokeless tobacco: Never Used  . Alcohol Use: No    Review of Systems  DATA OBTAINED: from patient, nurse GENERAL:  no fevers, fatigue, appetite changes SKIN: No itching, rash HEENT: No complaint RESPIRATORY: No cough, wheezing, SOB CARDIAC: No chest pain, palpitations, lower extremity edema  GI: No abdominal pain, No N/V/D or constipation, No heartburn or reflux  GU: No dysuria, frequency or urgency, or incontinence  MUSCULOSKELETAL: No unrelieved bone/joint pain NEUROLOGIC: No headache, dizziness  PSYCHIATRIC: No overt anxiety or sadness  Filed Vitals:   08/02/15 1446  BP: 105/48  Pulse: 80  Temp: 96 F (35.6 C)  Resp: 18    Physical Exam  GENERAL APPEARANCE: Alert, conversant,WF No acute distress  SKIN: No diaphoresis rash, or wounds HEENT: Unremarkable RESPIRATORY: Breathing is even, unlabored. Lung sounds are clear   CARDIOVASCULAR: Heart RRR no murmurs, rubs or gallops. No peripheral edema  GASTROINTESTINAL: Abdomen is soft, non-tender, not distended w/ normal bowel sounds.  GENITOURINARY: Bladder non tender, not distended  MUSCULOSKELETAL: No abnormal joints or musculature NEUROLOGIC: Cranial nerves 2-12 grossly intact. Moves all extremities PSYCHIATRIC: Mood and affect appropriate to situation, pleasantly confused, no behavioral issues  Patient Active Problem List   Diagnosis Date Noted  . Skin yeast infection 05/10/2015  . Abrasion of buttock 05/10/2015  . Infection due to ESBL-producing Escherichia coli 04/19/2015  .  Low back pain potentially associated with radiculopathy 04/18/2015  . Chronic respiratory failure ([place]) 03/16/2015  . Allergic rhinitis 03/16/2015  . Chronic pain 03/16/2015  . Osteoarthritis of multiple joints 03/16/2015  . Duodenal ulcer hemorrhage 03/12/2015  . GI bleed 03/11/2015  . Melena 03/11/2015  . Pressure ulcer 03/11/2015  . Headache 03/09/2015  . Abdominal distension 03/01/2015  . Cephalgia 03/01/2015  . Nausea and vomiting 02/27/2015  . Constipation 02/27/2015  . Benign essential HTN 02/27/2015  . Anemia, iron deficiency 02/27/2015  . Anemia of chronic disease 02/27/2015  . Chronic atrial fibrillation ([place]) 02/27/2015  . Hypokalemia 02/27/2015  . CKD (chronic kidney disease) stage 3, GFR 30-59 ml/min 02/27/2015  . Ileus ([place]) 02/26/2015  . Malnutrition of moderate degree ([place]) 10/10/2013  . FTT (failure to thrive) in adult 07/29/2013  . Dementia 12/19/2012  . Depression 12/19/2012  . Coronary artery disease   . Diastolic CHF, chronic ([place]) 05/24/2011    CBC    Component Value Date/Time   WBC 4.3 04/19/2015 1405   WBC 3.8 07/17/2014   RBC 2.78* 04/19/2015 1405   RBC 2.33* 10/17/2013 0342   HGB 9.2* 04/19/2015 1405   HCT 28.2* 04/19/2015 1405   PLT 127.0* 04/19/2015 1405   MCV 101.5* 04/19/2015 1405   LYMPHSABS 1.1 04/19/2015 1405   MONOABS 0.4 04/19/2015 1405   EOSABS 0.3 04/19/2015 1405   BASOSABS 0.0 04/19/2015 1405    CMP     Component Value Date/Time   NA 141 03/15/2015 0522   NA 146 09/04/2014   K 3.2* 03/15/2015 0522   CL 116* 03/15/2015 0522   CO2 20* 03/15/2015 0522   GLUCOSE 144* 03/15/2015 0522   BUN 13 03/15/2015 0522   BUN 24* 09/04/2014   CREATININE  0.96 03/15/2015 0522   CREATININE 1.4* 09/04/2014   CALCIUM 8.3* 03/15/2015 0522   PROT 5.6* 03/12/2015 0525   ALBUMIN 2.8* 03/12/2015 0525   AST 12* 03/12/2015 0525   ALT 6* 03/12/2015 0525   ALKPHOS 49 03/12/2015 0525   BILITOT 0.6 03/12/2015 0525   GFRNONAA 51* 03/15/2015 0522    GFRAA 59* 03/15/2015 0522    Assessment and Plan  Diastolic CHF, chronic (HCC) Chronic and stable;plan - cont coreg 3.125 daily and lasix 40 mg daily  Coronary artery disease Stable without CP; cont coreg 3.125; not on ASA 2/2 GI bleed with duodenal ulcer  Chronic atrial fibrillation ([facility]) Controlled with coreg 3.125 mg daily; pt not on prophylaxis 2/2prior GI bleed from duodenal ulcer    Hennie Duos, MD

## 2015-08-08 ENCOUNTER — Encounter: Payer: Self-pay | Admitting: Internal Medicine

## 2015-08-08 NOTE — Assessment & Plan Note (Signed)
Chronic and stable;plan - cont coreg 3.125 daily and lasix 40 mg daily

## 2015-08-08 NOTE — Assessment & Plan Note (Addendum)
Stable without CP; cont coreg 3.125; not on ASA 2/2 GI bleed with duodenal ulcer

## 2015-08-08 NOTE — Assessment & Plan Note (Signed)
Controlled with coreg 3.125 mg daily; pt not on prophylaxis 2/2prior GI bleed from duodenal ulcer

## 2015-08-10 ENCOUNTER — Encounter: Payer: Self-pay | Admitting: Adult Health

## 2015-08-10 NOTE — Progress Notes (Signed)
Patient ID: [name], female   DOB: 1924/06/28, 82 y.o.   MRN: [id number]    Facility:  [facility]       Allergies  Allergen Reactions  . Aspirin Other (See Comments)    [name]    Chief Complaint  Patient presents with  . Medical Management of Chronic Issues    HPI:  She is a long term resident of this facility being seen for the management of her chronic illnesses. Overall her status is without change. She is not voicing any concerns or complaints at this time; telling me that she is doing well. There are no nursing concerns at this time.    Past Medical History  Diagnosis Date  . Atrial fibrillation ([place])   . Altered mental status   . Encephalopathy   . Parkinson disease ([place])   . Hypertension   . GERD (gastroesophageal reflux disease)   . Hyperlipemia   . Diabetes mellitus   . Coronary artery disease   . History of adenomatous polyp of colon 06/24/99  . Enlarged heart   . DEMENTIA   . Edema of lower extremity 07/13/11    right leg more swollen than left leg  . Esophageal dysmotility 07/02/12  . Horseshoe kidney   . Pancreatic lesion 05/22/11    no further workup  per PCP/family due to age  . Anemia   . Peripheral neuropathy ([place])   . Depression   . Thrombophlebitis   . TIA (transient ischemic attack) 12/19/2012  . Sigmoid volvulus ([place]) 05/24/2013    Past Surgical History  Procedure Laterality Date  . Shoulder surgery  2001    left clavicle excision and acromioplasty  . Abdominal hysterectomy    . Cholecystectomy    . Total hip arthroplasty    . Breast surgery      2 benign tumors removed left breast  . Foot surgery      benign tumors from foot  . Esophageal dilation      several times by Dr. [name]  . Nose surgery    . Esophagogastroduodenoscopy (egd) with esophageal dilation N/A 08/01/2012    Procedure: ESOPHAGOGASTRODUODENOSCOPY (EGD) WITH ESOPHAGEAL DILATION;  Surgeon: [name], MD;  Location: [facility];  Service:  Endoscopy;  Laterality: N/A;  with c-arm savory dilators  . Flexible sigmoidoscopy N/A 05/24/2013    Procedure: FLEXIBLE SIGMOIDOSCOPY;  Surgeon: [name], MD;  Location: [facility];  Service: Endoscopy;  Laterality: N/A;  . Flexible sigmoidoscopy N/A 05/26/2013    Procedure:  flex with decompression of sigmoid volvulus;  Surgeon: [name], MD;  Location: [facility];  Service: Endoscopy;  Laterality: N/A;  . Esophagogastroduodenoscopy (egd) with propofol N/A 03/12/2015    Procedure: ESOPHAGOGASTRODUODENOSCOPY (EGD) WITH PROPOFOL;  Surgeon: [name], MD;  Location: [facility];  Service: Endoscopy;  Laterality: N/A;    VITAL SIGNS BP 143/65 mmHg  Pulse 83  Temp(Src) 96.4 F (35.8 C)  Ht 5\' 7"  (1.702 m)  Wt 160 lb (72.576 kg)  BMI 25.05 kg/m2  SpO2 95%  Patient's Medications  New Prescriptions   No medications on file  Previous Medications   ACETAMINOPHEN (TYLENOL) 325 MG TABLET    Take 2 tablets (650 mg total) by mouth every 6 (six) hours as needed for mild pain.   AMLODIPINE (NORVASC) 2.5 MG TABLET    Take 1 tablet (2.5 mg total) by mouth daily.   BACLOFEN (LIORESAL) 10 MG TABLET    Take 5 mg by  mouth at bedtime.   CARVEDILOL (COREG) 3.125 MG TABLET    Take 3.125 mg by mouth daily.   CHOLECALCIFEROL (VITAMIN D) 1000 UNITS TABLET    Take 1,000 Units by mouth daily.   CRANBERRY 475 MG CAPS    Take 1 capsule by mouth 2 (two) times daily.   DOCUSATE SODIUM (COLACE) 100 MG CAPSULE    Take 200 mg by mouth daily.   DORZOLAMIDE (TRUSOPT) 2 % OPHTHALMIC SOLUTION    Place 1 drop into both eyes 3 (three) times daily.    ESCITALOPRAM (LEXAPRO) 10 MG TABLET    Take 1 tablet (10 mg total) by mouth daily.   FERROUS SULFATE 325 (65 FE) MG TABLET    Take 325 mg by mouth daily with breakfast.   FLUTICASONE (FLONASE) 50 MCG/ACT NASAL SPRAY    Place 2 sprays into both nostrils at bedtime.   FUROSEMIDE (LASIX) 40 MG TABLET    Take 1 tablet (40 mg total) by mouth daily.    HYDROCODONE-ACETAMINOPHEN (NORCO/VICODIN) 5-325 MG TABLET    Take one tablet by mouth every 6 hours as needed for pain, NTE 3000mg  of APAP from all sources/24h   LIDOCAINE (LIDODERM) 5 %    Place 1 patch onto the skin daily. Apply 1 patch to left lower back/hip every morning.  Remove & Discard patch within 12 hours or as directed by MD   LINACLOTIDE (LINZESS) 145 MCG CAPS CAPSULE    Take 145 mcg by mouth daily.   LORATADINE (CLARITIN) 10 MG TABLET    Take 10 mg by mouth daily.   MULTIPLE VITAMINS-MINERALS (CERTAGEN PO)    Take 1 tablet by mouth daily.    ONDANSETRON (ZOFRAN) 8 MG TABLET    Take 8 mg by mouth every 8 (eight) hours as needed for nausea or vomiting.   PANTOPRAZOLE (PROTONIX) 40 MG TABLET    Take 1 tablet (40 mg total) by mouth 2 (two) times daily. Protonix 40 mg po twice daily for one month then change to 40 mg po once daily   POLYETHYLENE GLYCOL (MIRALAX / GLYCOLAX) PACKET    Take 17 g by mouth daily.   POTASSIUM CHLORIDE SA (K-DUR,KLOR-CON) 20 MEQ TABLET    Take 20 mEq by mouth daily.   SENNA (SENOKOT) 8.6 MG TABS TABLET    Take 1 tablet (8.6 mg total) by mouth 2 (two) times daily.   SODIUM CHLORIDE (OCEAN) 0.65 % SOLN NASAL SPRAY    Place 1 spray into both nostrils as directed. Four times daily And every 24 hours as needed to moisturize nasal passages.   TRAVOPROST, BAK FREE, (TRAVATAN) 0.004 % SOLN OPHTHALMIC SOLUTION    Place 1 drop into both eyes at bedtime.    TRAZODONE (DESYREL) 50 MG TABLET    Take 0.5 tablets (25 mg total) by mouth at bedtime as needed for sleep.   VITAMIN B-12 (CYANOCOBALAMIN) 500 MCG TABLET    Take 500 mcg by mouth daily.  Modified Medications   No medications on file  Discontinued Medications   No medications on file     SIGNIFICANT DIAGNOSTIC EXAMS  07-16-14: right hand x-ray: no acute abnormalities   10-13-14: chest x-ray: no acute cardiopulmonary process present.   12-16-14: chest x-ray: moderate cardiomegaly; with mild pulmonary venous  congestion  12-21-14: chest x-ray: no acute cardiopulmonary process   01-02-15: left hand x-ray: moderate osteoarthritis   02-10-15: kub: unremarkable abdomen exam  02-15-15: abdominal ultrasound: 1. Gallbladder not visualized. 2. No hydronephrosis no renal calculus;  3. The pancreas not well visualized; which could be related to gassy abdomen but an appearance which might represent pancreatitis in the appropriate clinical setting.   02-26-15: right lower extremity doppler: - No evidence of deep vein thrombosis involving the right lower   extremity. - No evidence of Baker&'s cyst on the right.  03-11-15: kub: Stable bowel gas pattern over this series of exams with continued gaseous distension of the redundant sigmoid colon. Favor chronic decreased colonic motility.  03-12-15: upper endoscopy: duodenal bulb 1.5-2.0 cm ulcer with clot at base. No active bleeding. Surrounding mucosa was edematous. Biopsy of the surrounding mucosa were done.   05-11-15: right ankle x-ray: normal     LABS REVIEWED:   07-17-14: wbc 3.8; hgb 9.7; ;hct 31.;2 mcv 108; plt 113; glucose 154; bun 21.3; creat 1.43;  Liver normal albumin 4.0; tsh 6.13; hgb a1c 6.1; uric acid 7.1  08-18-14: tsh 4.09  10-13-14: wbc 4.7; hgb 9.7; hct 31.1; mcv 105.3; plt 111; glucose 176; bun 27.7; creat 1.44; k+4.4; na++143; C02: 35; liver normal albumin 4.5 12-21-14: wbc 5.1; hgb 9.7; hct 31.2; mcv 106.7; plt 129; glucose 217; bun 27.6; creat 1.37; k+ 3.8; na++135 01-06-15: hgb a1c 6.8; chol 178; ldl 110; trig 122; hdl 44 01-19-15: wbc 8.2; hgb 9.2; hct 31.4; mcv 90.2; plt 255; glucose 181; bun 24.4; creat 1.05; k+ 4.4; na++139 02-05-15: vit [id number]: [id number]; folic acid A999333; iron 64  02-11-15: wbc 5.3; hgb 10.2; hct 31.2; mcv 102.3; plt 145 glucose 136; bun 36; creat 1.59; k+ 4.0; na++136; liver normal albumin 3.9; lipase 20; sed rate 40  02-13-15: urine culture: gram neg rod  02-26-15: wbc 5.5 ;hgb 10.7; hct 32.1; mcv 102.9; plt 182; glucose 160; bun 23;  creat 1.28; k+ 3.4; na++141; liver normal albumin 3.9; urine culture: 20,000 colonies e-coli 02-26-14: wbc 4.8; hgb 9.9; hct 30.2; mcv 103.4; plt 167; glucose 136; bun 20; creat 1.32; k+ 3.1; na++140; liver normal albumin 3.4 phos 3.6; mag 2.0; tsh 5.235  03-09-15: wbc 5.8; hgb 10.2; hct 30.3; mcv 103.1; plt 221; glucose 137; bun 14; creat 1.07; k+ 3.2; na++139; liver normal albumin 3.7; lipase 24; urine culture: e-coli: cipro 03-12-15: wbc 4.5 hgb 7.6; hct 23.0; mcv 101.3; plt 168; glucose 138; bun 40; creat 1.24; k+ 3.0; na++141; liver normal albumin 2.8 03-15-15: wbc 5.4; hgb 8.1; hct 25.1; mcv 105.5; plt 171; glucose 144; bun 13; creat 0.96; k+ 3.2; na++141  03-18-15: wbc 5.3; hgb 7.7; hct 24.9; mcv 105; plt 188; glucose 149; bun 8.1; creat 1.12; k+ 3.6; na++143  04-30-15: H-pylori: neg      Review of Systems  Constitutional: Negative for appetite change and fatigue.  HENT: Negative for congestion.   Respiratory: Negative for cough, chest tightness and shortness of breath.   Cardiovascular: Negative for chest pain, palpitations and leg swelling.  Gastrointestinal: Negative for nausea, abdominal pain, diarrhea and constipation.  Musculoskeletal: no complaint of muscle or joint pain    Skin: Negative for pallor.  Neurological: Negative for dizziness.  Psychiatric/Behavioral: The patient is not nervous/anxious.       Physical Exam  Constitutional: No distress.  Eyes: Conjunctivae are normal.  Neck: Neck supple. No JVD present. No thyromegaly present.  Cardiovascular: Normal rate, regular rhythm and intact distal pulses.   Respiratory: Effort normal and breath sounds normal. No respiratory distress. She has no wheezes.  Is 02 dependent  GI: Soft. Bowel sounds are normal. She exhibits no distension. No tenderness Musculoskeletal: She exhibits no edema.  Able to move all extremities .   Lymphadenopathy:    She has no cervical adenopathy.  Neurological: She is alert.  Skin: Skin is  warm and dry. She is not diaphoretic.  Psychiatric: She has a normal mood and affect.     ASSESSMENT/ PLAN:  1. Hypertension: will continue norvasc 2.5 mg daily coreg 3.15 mg daily   2. Chronic diastolic heart failure: will continue lasix 40 mg daily with k+ 20 meq daily   3. Duodenal ulcer: will continue protonix 40 mg  daily    4. UTI: will continue cranberry daily    5. Anemia: will continue iron daily hgb is 8.1  6. Chronic afib: will continue coreg 3.125 mg daily for rate control. Will continue asa 81mg  daily .  Her plavix was stopped in the hospital   7. Chronic respiratory failure: is 02 dependent.   8. Allergic rhinitis: will continue flonase nightly and claritin 10 mg daily   9. Chronic pain due to osteoarthritis: will continue lidoderm to lower back daily; and vicodin 5/325 mg every 6 hours as needed for pain will continue baclofen 5 mg nightly for leg spasticity and will monitor   10. Depression: is stable receives benefit form lexapro 10 mg daily has trazodone 25 mg nightly as needed for sleep   11. Chronic constipation: will continue colace 200 mg daily linzess 145 mcg daily; senna twice daily miralax daily   12. Glaucoma: will continue  trusopt to both eyes three times daily; travatan to both eyes nightly   13. Low back pain with radiculopathy: will continue neurontin 100 mg nightly for her bilateral foot burning/pain   14. Depression: will continue lexapro 10 mg daily takes trazodone 25 mg nightly as needed for sleep          Ok Edwards NP [facility]  Contact [phone] Monday through Friday 8am- 5pm  After hours call [phone]

## 2015-08-26 DIAGNOSIS — G4700 Insomnia, unspecified: Secondary | ICD-10-CM | POA: Diagnosis not present

## 2015-08-26 DIAGNOSIS — F0391 Unspecified dementia with behavioral disturbance: Secondary | ICD-10-CM | POA: Diagnosis not present

## 2015-08-26 DIAGNOSIS — F411 Generalized anxiety disorder: Secondary | ICD-10-CM | POA: Diagnosis not present

## 2015-08-26 DIAGNOSIS — F329 Major depressive disorder, single episode, unspecified: Secondary | ICD-10-CM | POA: Diagnosis not present

## 2015-09-16 ENCOUNTER — Non-Acute Institutional Stay (SKILLED_NURSING_FACILITY): Payer: Medicare Other | Admitting: Internal Medicine

## 2015-09-16 DIAGNOSIS — E538 Deficiency of other specified B group vitamins: Secondary | ICD-10-CM

## 2015-09-16 DIAGNOSIS — K269 Duodenal ulcer, unspecified as acute or chronic, without hemorrhage or perforation: Secondary | ICD-10-CM

## 2015-09-16 DIAGNOSIS — D509 Iron deficiency anemia, unspecified: Secondary | ICD-10-CM | POA: Diagnosis not present

## 2015-09-16 NOTE — Progress Notes (Signed)
MRN: [id number] Name: Maria Christian  Sex: female Age: 82 y.o. DOB: 1924/12/14  [place] #: Dayna Barker Facility/Room: Level Of Care: SNF Provider: Inocencio Homes D Emergency Contacts: Extended Emergency Contact Information Primary Emergency Contact: [name] [place] Phone: [phone] Relation: Son Secondary Emergency Contact: Roberts,Lynn Address: [address]          [place], [place] [address] [name] of [place] Phone: [phone] Mobile Phone: [phone] Relation: Daughter  Code Status:   Allergies: Aspirin  Chief Complaint  Patient presents with  . Medical Management of Chronic Issues    HPI: Patient is 82 y.o. female who is being seen for routine issues of duodenal ulcer, anemia and B12 deficiency.  Past Medical History  Diagnosis Date  . Atrial fibrillation ([place])   . Altered mental status   . Encephalopathy   . Parkinson disease ([place])   . Hypertension   . GERD (gastroesophageal reflux disease)   . Hyperlipemia   . Diabetes mellitus   . Coronary artery disease   . History of adenomatous polyp of colon 06/24/99  . Enlarged heart   . DEMENTIA   . Edema of lower extremity 07/13/11    right leg more swollen than left leg  . Esophageal dysmotility 07/02/12  . Horseshoe kidney   . Pancreatic lesion 05/22/11    no further workup  per PCP/family due to age  . Anemia   . Peripheral neuropathy ([place])   . Depression   . Thrombophlebitis   . TIA (transient ischemic attack) 12/19/2012  . Sigmoid volvulus ([place]) 05/24/2013  . Constipation 02/27/2015    Past Surgical History  Procedure Laterality Date  . Shoulder surgery  2001    left clavicle excision and acromioplasty  . Abdominal hysterectomy    . Cholecystectomy    . Total hip arthroplasty    . Breast surgery      2 benign tumors removed left breast  . Foot surgery      benign tumors from foot  . Esophageal dilation      several times by Dr. [name]  . Nose surgery     . Esophagogastroduodenoscopy (egd) with esophageal dilation N/A 08/01/2012    Procedure: ESOPHAGOGASTRODUODENOSCOPY (EGD) WITH ESOPHAGEAL DILATION;  Surgeon: [name], MD;  Location: [facility];  Service: Endoscopy;  Laterality: N/A;  with c-arm savory dilators  . Flexible sigmoidoscopy N/A 05/24/2013    Procedure: FLEXIBLE SIGMOIDOSCOPY;  Surgeon: [name], MD;  Location: [facility];  Service: Endoscopy;  Laterality: N/A;  . Flexible sigmoidoscopy N/A 05/26/2013    Procedure:  flex with decompression of sigmoid volvulus;  Surgeon: [name], MD;  Location: [facility];  Service: Endoscopy;  Laterality: N/A;  . Esophagogastroduodenoscopy (egd) with propofol N/A 03/12/2015    Procedure: ESOPHAGOGASTRODUODENOSCOPY (EGD) WITH PROPOFOL;  Surgeon: [name], MD;  Location: [facility];  Service: Endoscopy;  Laterality: N/A;      Medication List       This list is accurate as of: 09/16/15 11:59 PM.  Always use your most recent med list.               acetaminophen 325 MG tablet  Commonly known as:  TYLENOL  Take 2 tablets (650 mg total) by mouth every 6 (six) hours as needed for mild pain.     amLODipine 2.5 MG tablet  Commonly known as:  NORVASC  Take 1 tablet (2.5 mg total) by mouth daily.     baclofen  10 MG tablet  Commonly known as:  LIORESAL  Take 5 mg by mouth at bedtime.     carvedilol 3.125 MG tablet  Commonly known as:  COREG  Take 3.125 mg by mouth daily.     CERTAGEN PO  Take 1 tablet by mouth daily.     cholecalciferol 1000 units tablet  Commonly known as:  VITAMIN D  Take 1,000 Units by mouth daily.     Cranberry 475 MG Caps  Take 1 capsule by mouth 2 (two) times daily.     docusate sodium 100 MG capsule  Commonly known as:  COLACE  Take 200 mg by mouth daily.     dorzolamide 2 % ophthalmic solution  Commonly known as:  TRUSOPT  Place 1 drop into both eyes 3 (three) times daily.     escitalopram 10 MG tablet  Commonly known as:   LEXAPRO  Take 1 tablet (10 mg total) by mouth daily.     ferrous sulfate 325 (65 FE) MG tablet  Take 325 mg by mouth daily with breakfast.     fluticasone 50 MCG/ACT nasal spray  Commonly known as:  FLONASE  Place 2 sprays into both nostrils at bedtime.     furosemide 40 MG tablet  Commonly known as:  LASIX  Take 1 tablet (40 mg total) by mouth daily.     HYDROcodone-acetaminophen 5-325 MG tablet  Commonly known as:  NORCO/VICODIN  Take one tablet by mouth every 6 hours as needed for pain, NTE 3000mg  of APAP from all sources/24h     lidocaine 5 %  Commonly known as:  LIDODERM  Place 1 patch onto the skin daily. Apply 1 patch to left lower back/hip every morning.  Remove & Discard patch within 12 hours or as directed by MD     LINZESS 145 MCG Caps capsule  Generic drug:  linaclotide  Take 145 mcg by mouth daily.     loratadine 10 MG tablet  Commonly known as:  CLARITIN  Take 10 mg by mouth daily.     ondansetron 8 MG tablet  Commonly known as:  ZOFRAN  Take 8 mg by mouth every 8 (eight) hours as needed for nausea or vomiting.     pantoprazole 40 MG tablet  Commonly known as:  PROTONIX  Take 1 tablet (40 mg total) by mouth 2 (two) times daily. Protonix 40 mg po twice daily for one month then change to 40 mg po once daily     polyethylene glycol packet  Commonly known as:  MIRALAX / GLYCOLAX  Take 17 g by mouth daily.     potassium chloride SA 20 MEQ tablet  Commonly known as:  K-DUR,KLOR-CON  Take 20 mEq by mouth daily.     senna 8.6 MG Tabs tablet  Commonly known as:  SENOKOT  Take 1 tablet (8.6 mg total) by mouth 2 (two) times daily.     sodium chloride 0.65 % Soln nasal spray  Commonly known as:  OCEAN  Place 1 spray into both nostrils as directed. Four times daily And every 24 hours as needed to moisturize nasal passages.     Travoprost (BAK Free) 0.004 % Soln ophthalmic solution  Commonly known as:  TRAVATAN  Place 1 drop into both eyes at bedtime.      traZODone 50 MG tablet  Commonly known as:  DESYREL  Take 0.5 tablets (25 mg total) by mouth at bedtime as needed for sleep.     vitamin B-12  500 MCG tablet  Commonly known as:  CYANOCOBALAMIN  Take 500 mcg by mouth daily.        No orders of the defined types were placed in this encounter.    Immunization History  Administered Date(s) Administered  . Influenza-Unspecified 07/13/2015  . PPD Test 07/28/2013    Social History  Substance Use Topics  . Smoking status: Former Smoker    Quit date: 10/09/1964  . Smokeless tobacco: Never Used  . Alcohol Use: No    Review of Systems  DATA OBTAINED: from patient, nurse GENERAL:  no fevers, fatigue, appetite changes SKIN: No itching, rash HEENT: No complaint RESPIRATORY: No cough, wheezing, SOB CARDIAC: No chest pain, palpitations, lower extremity edema  GI: No abdominal pain, No N/V/D or constipation, No heartburn or reflux  GU: No dysuria, frequency or urgency, or incontinence  MUSCULOSKELETAL: No unrelieved bone/joint pain NEUROLOGIC: No headache, dizziness  PSYCHIATRIC: No overt anxiety or sadness  Filed Vitals:   09/27/15 2008  BP: 138/72  Pulse: 68  Temp: 97.1 F (36.2 C)  Resp: 18    Physical Exam  GENERAL APPEARANCE: Alert, conversant, No acute distress  SKIN: No diaphoresis rash, or wounds HEENT: Unremarkable RESPIRATORY: Breathing is even, unlabored. Lung sounds are clear; O2 dep  CARDIOVASCULAR: Heart RRR no murmurs, rubs or gallops. No peripheral edema  GASTROINTESTINAL: Abdomen is soft, non-tender, not distended w/ normal bowel sounds.  GENITOURINARY: Bladder non tender, not distended  MUSCULOSKELETAL: No abnormal joints or musculature NEUROLOGIC: Cranial nerves 2-12 grossly intact. Moves all extremities PSYCHIATRIC: Mood and affect appropriate to situation, no behavioral issues  Patient Active Problem List   Diagnosis Date Noted  . Vitamin B12 deficiency 09/27/2015  . Skin yeast infection  05/10/2015  . Abrasion of buttock 05/10/2015  . Infection due to ESBL-producing Escherichia coli 04/19/2015  . Low back pain potentially associated with radiculopathy 04/18/2015  . Chronic respiratory failure ([place]) 03/16/2015  . Allergic rhinitis 03/16/2015  . Chronic pain 03/16/2015  . Osteoarthritis of multiple joints 03/16/2015  . Duodenal ulcer 03/12/2015  . GI bleed 03/11/2015  . Melena 03/11/2015  . Pressure ulcer 03/11/2015  . Headache 03/09/2015  . Abdominal distension 03/01/2015  . Cephalgia 03/01/2015  . Nausea and vomiting 02/27/2015  . Constipation 02/27/2015  . Benign essential HTN 02/27/2015  . Anemia, iron deficiency 02/27/2015  . Anemia of chronic disease 02/27/2015  . Chronic atrial fibrillation ([place]) 02/27/2015  . Hypokalemia 02/27/2015  . CKD (chronic kidney disease) stage 3, GFR 30-59 ml/min 02/27/2015  . Ileus ([place]) 02/26/2015  . Malnutrition of moderate degree ([place]) 10/10/2013  . FTT (failure to thrive) in adult 07/29/2013  . Dementia 12/19/2012  . Depression 12/19/2012  . Coronary artery disease   . Diastolic CHF, chronic ([place]) 05/24/2011    CBC    Component Value Date/Time   WBC 4.3 04/19/2015 1405   WBC 3.8 07/17/2014   RBC 2.78* 04/19/2015 1405   RBC 2.33* 10/17/2013 0342   HGB 9.2* 04/19/2015 1405   HCT 28.2* 04/19/2015 1405   PLT 127.0* 04/19/2015 1405   MCV 101.5* 04/19/2015 1405   LYMPHSABS 1.1 04/19/2015 1405   MONOABS 0.4 04/19/2015 1405   EOSABS 0.3 04/19/2015 1405   BASOSABS 0.0 04/19/2015 1405    CMP     Component Value Date/Time   NA 141 03/15/2015 0522   NA 146 09/04/2014   K 3.2* 03/15/2015 0522   CL 116* 03/15/2015 0522   CO2 20* 03/15/2015 0522   GLUCOSE 144* 03/15/2015 0522  BUN 13 03/15/2015 0522   BUN 24* 09/04/2014   CREATININE 0.96 03/15/2015 0522   CREATININE 1.4* 09/04/2014   CALCIUM 8.3* 03/15/2015 0522   PROT 5.6* 03/12/2015 0525   ALBUMIN 2.8* 03/12/2015 0525   AST 12* 03/12/2015 0525   ALT 6*  03/12/2015 0525   ALKPHOS 49 03/12/2015 0525   BILITOT 0.6 03/12/2015 0525   GFRNONAA 51* 03/15/2015 0522   GFRAA 59* 03/15/2015 0522    Assessment and Plan  Duodenal ulcer No c/o pain, no sign of bleeding; continue protonix 40 mg daily    Anemia, iron deficiency will continue iron daily hgb is 8.1   Vitamin B12 deficiency Cont 500 mcg daily; order level    Hennie Duos, MD

## 2015-09-21 DIAGNOSIS — H401134 Primary open-angle glaucoma, bilateral, indeterminate stage: Secondary | ICD-10-CM | POA: Diagnosis not present

## 2015-09-21 DIAGNOSIS — H04123 Dry eye syndrome of bilateral lacrimal glands: Secondary | ICD-10-CM | POA: Diagnosis not present

## 2015-09-21 DIAGNOSIS — H01009 Unspecified blepharitis unspecified eye, unspecified eyelid: Secondary | ICD-10-CM | POA: Diagnosis not present

## 2015-09-21 DIAGNOSIS — E119 Type 2 diabetes mellitus without complications: Secondary | ICD-10-CM | POA: Diagnosis not present

## 2015-09-27 ENCOUNTER — Encounter: Payer: Self-pay | Admitting: Internal Medicine

## 2015-09-27 DIAGNOSIS — E538 Deficiency of other specified B group vitamins: Secondary | ICD-10-CM | POA: Insufficient documentation

## 2015-09-27 NOTE — Assessment & Plan Note (Signed)
No c/o pain, no sign of bleeding; continue protonix 40 mg daily

## 2015-09-27 NOTE — Assessment & Plan Note (Signed)
Cont 500 mcg daily; order level

## 2015-09-27 NOTE — Assessment & Plan Note (Signed)
will continue iron daily hgb is 8.1

## 2015-10-05 DIAGNOSIS — I739 Peripheral vascular disease, unspecified: Secondary | ICD-10-CM | POA: Diagnosis not present

## 2015-10-05 DIAGNOSIS — E119 Type 2 diabetes mellitus without complications: Secondary | ICD-10-CM | POA: Diagnosis not present

## 2015-10-05 DIAGNOSIS — B351 Tinea unguium: Secondary | ICD-10-CM | POA: Diagnosis not present

## 2015-10-05 DIAGNOSIS — M79671 Pain in right foot: Secondary | ICD-10-CM | POA: Diagnosis not present

## 2015-10-12 ENCOUNTER — Non-Acute Institutional Stay (SKILLED_NURSING_FACILITY): Payer: Medicare Other | Admitting: Adult Health

## 2015-10-12 ENCOUNTER — Encounter: Payer: Self-pay | Admitting: Adult Health

## 2015-10-12 DIAGNOSIS — J301 Allergic rhinitis due to pollen: Secondary | ICD-10-CM | POA: Diagnosis not present

## 2015-10-12 DIAGNOSIS — I482 Chronic atrial fibrillation, unspecified: Secondary | ICD-10-CM

## 2015-10-12 DIAGNOSIS — D638 Anemia in other chronic diseases classified elsewhere: Secondary | ICD-10-CM

## 2015-10-12 DIAGNOSIS — N183 Chronic kidney disease, stage 3 unspecified: Secondary | ICD-10-CM

## 2015-10-12 DIAGNOSIS — I5032 Chronic diastolic (congestive) heart failure: Secondary | ICD-10-CM | POA: Diagnosis not present

## 2015-10-12 DIAGNOSIS — I10 Essential (primary) hypertension: Secondary | ICD-10-CM

## 2015-10-12 DIAGNOSIS — G8929 Other chronic pain: Secondary | ICD-10-CM | POA: Diagnosis not present

## 2015-10-12 DIAGNOSIS — M150 Primary generalized (osteo)arthritis: Secondary | ICD-10-CM

## 2015-10-12 DIAGNOSIS — M159 Polyosteoarthritis, unspecified: Secondary | ICD-10-CM

## 2015-10-12 NOTE — Progress Notes (Signed)
Patient ID: Maria Christian, female   DOB: August 13, 1924, 82 y.o.   MRN: [id number]   Facility:  [facility]     CODE STATUS: DNR  Allergies  Allergen Reactions  . Aspirin Other (See Comments)    [name]    Chief Complaint  Patient presents with  . Medical Management of Chronic Issues    Follow up    HPI:  She is a long term resident of this facility being seen for the management of her chronic illnesses. Overall there is little change in her status. She does engage in activities. She is not voicing any complaints or concerns. There are no nursing concerns at this time.   Past Medical History  Diagnosis Date  . Atrial fibrillation ([place])   . Altered mental status   . Encephalopathy   . Parkinson disease ([place])   . Hypertension   . GERD (gastroesophageal reflux disease)   . Hyperlipemia   . Diabetes mellitus   . Coronary artery disease   . History of adenomatous polyp of colon 06/24/99  . Enlarged heart   . DEMENTIA   . Edema of lower extremity 07/13/11    right leg more swollen than left leg  . Esophageal dysmotility 07/02/12  . Horseshoe kidney   . Pancreatic lesion 05/22/11    no further workup  per PCP/family due to age  . Anemia   . Peripheral neuropathy ([place])   . Depression   . Thrombophlebitis   . TIA (transient ischemic attack) 12/19/2012  . Sigmoid volvulus ([place]) 05/24/2013  . Constipation 02/27/2015    Past Surgical History  Procedure Laterality Date  . Shoulder surgery  2001    left clavicle excision and acromioplasty  . Abdominal hysterectomy    . Cholecystectomy    . Total hip arthroplasty    . Breast surgery      2 benign tumors removed left breast  . Foot surgery      benign tumors from foot  . Esophageal dilation      several times by Dr. [name]  . Nose surgery    . Esophagogastroduodenoscopy (egd) with esophageal dilation N/A 08/01/2012    Procedure: ESOPHAGOGASTRODUODENOSCOPY (EGD) WITH ESOPHAGEAL DILATION;  Surgeon: [name], MD;   Location: [facility];  Service: Endoscopy;  Laterality: N/A;  with c-arm savory dilators  . Flexible sigmoidoscopy N/A 05/24/2013    Procedure: FLEXIBLE SIGMOIDOSCOPY;  Surgeon: [name], MD;  Location: [facility];  Service: Endoscopy;  Laterality: N/A;  . Flexible sigmoidoscopy N/A 05/26/2013    Procedure:  flex with decompression of sigmoid volvulus;  Surgeon: [name], MD;  Location: [facility];  Service: Endoscopy;  Laterality: N/A;  . Esophagogastroduodenoscopy (egd) with propofol N/A 03/12/2015    Procedure: ESOPHAGOGASTRODUODENOSCOPY (EGD) WITH PROPOFOL;  Surgeon: [name], MD;  [facility];  Service: Endoscopy;  Laterality: N/A;    Social History   Social History  . Marital Status: Widowed    Spouse Name: N/A  . Number of Children: 5  . Years of Education: 10th   Occupational History  . Retired    Social History Main Topics  . Smoking status: Former Smoker    Quit date: 10/09/1964  . Smokeless tobacco: Never Used  . Alcohol Use: No  . Drug Use: No  . Sexual Activity: No   Other Topics Concern  . Not on file   Social History Narrative   Pt lives at [facility] and [place].  Caffeine Use: very small amount daily   Family History  Problem Relation Age of Onset  . Cancer    . Heart disease        VITAL SIGNS BP 137/66 mmHg  Pulse 76  Temp(Src) 98.3 F (36.8 C) (Oral)  Resp 20  Ht 5\' 7"  (1.702 m)  Wt 150 lb 6 oz (68.21 kg)  BMI 23.55 kg/m2  SpO2 96%  Patient's Medications  New Prescriptions   No medications on file  Previous Medications   ACETAMINOPHEN (TYLENOL) 325 MG TABLET    Take 2 tablets (650 mg total) by mouth every 6 (six) hours as needed for mild pain.   AMLODIPINE (NORVASC) 2.5 MG TABLET    Take 1 tablet (2.5 mg total) by mouth daily.   ASPIRIN 81 MG TABLET    Take 81 mg by mouth daily.   BACLOFEN (LIORESAL) 10 MG TABLET    Take 5 mg by mouth at bedtime.   BIOTIN 5 MG/ML LIQD    Take by mouth 2 (two)  times daily.   CARVEDILOL (COREG) 3.125 MG TABLET    Take 3.125 mg by mouth daily.   CHOLECALCIFEROL (VITAMIN D) 1000 UNITS TABLET    Take 1,000 Units by mouth daily.   CLOTRIMAZOLE (LOTRIMIN) 1 % CREAM    Apply 1 application topically 2 (two) times daily.   CRANBERRY 475 MG CAPS    Take 1 capsule by mouth 2 (two) times daily.   DOCUSATE SODIUM (COLACE) 100 MG CAPSULE    Take 200 mg by mouth daily.   DORZOLAMIDE (TRUSOPT) 2 % OPHTHALMIC SOLUTION    Place 1 drop into both eyes 3 (three) times daily.    ESCITALOPRAM (LEXAPRO) 10 MG TABLET    Take 1 tablet (10 mg total) by mouth daily.   FERROUS SULFATE 325 (65 FE) MG TABLET    Take 325 mg by mouth daily with breakfast.   FLUTICASONE (FLONASE) 50 MCG/ACT NASAL SPRAY    Place 2 sprays into both nostrils at bedtime.   FUROSEMIDE (LASIX) 40 MG TABLET    Take 1 tablet (40 mg total) by mouth daily.   GABAPENTIN (NEURONTIN) 100 MG CAPSULE    Take 100 mg by mouth at bedtime.   LIDOCAINE (LIDODERM) 5 %    Place 1 patch onto the skin daily. Apply 1 patch to left lower back/hip every morning.  Remove & Discard patch within 12 hours or as directed by MD   LINACLOTIDE (LINZESS) 145 MCG CAPS CAPSULE    Take 145 mcg by mouth daily.   LORATADINE (CLARITIN) 10 MG TABLET    Take 10 mg by mouth daily.   MULTIPLE VITAMINS-MINERALS (CERTAGEN PO)    Take 1 tablet by mouth daily.    ONDANSETRON (ZOFRAN) 8 MG TABLET    Take 8 mg by mouth every 8 (eight) hours as needed for nausea or vomiting.   PANTOPRAZOLE (PROTONIX) 40 MG TABLET    Take 1 tablet (40 mg total) by mouth 2 (two) times daily. Protonix 40 mg po twice daily for one month then change to 40 mg po once daily   POLYETHYLENE GLYCOL (MIRALAX / GLYCOLAX) PACKET    Take 17 g by mouth daily.   POTASSIUM CHLORIDE SA (K-DUR,KLOR-CON) 20 MEQ TABLET    Take 20 mEq by mouth daily.   SENNA (SENOKOT) 8.6 MG TABS TABLET    Take 1 tablet (8.6 mg total) by mouth 2 (two) times daily.   SODIUM CHLORIDE (OCEAN) 0.65 % SOLN  NASAL  SPRAY    Place 1 spray into both nostrils as directed. Four times daily And every 24 hours as needed to moisturize nasal passages.   TRAVOPROST, BAK FREE, (TRAVATAN) 0.004 % SOLN OPHTHALMIC SOLUTION    Place 1 drop into both eyes at bedtime.    VITAMIN B-12 (CYANOCOBALAMIN) 500 MCG TABLET    Take 500 mcg by mouth daily.  Modified Medications   No medications on file  Discontinued Medications   HYDROCODONE-ACETAMINOPHEN (NORCO/VICODIN) 5-325 MG TABLET    Take one tablet by mouth every 6 hours as needed for pain, NTE 3000mg  of APAP from all sources/24h   TRAZODONE (DESYREL) 50 MG TABLET    Take 0.5 tablets (25 mg total) by mouth at bedtime as needed for sleep.     SIGNIFICANT DIAGNOSTIC EXAMS   07-16-14: right hand x-ray: no acute abnormalities   10-13-14: chest x-ray: no acute cardiopulmonary process present.   12-16-14: chest x-ray: moderate cardiomegaly; with mild pulmonary venous congestion  12-21-14: chest x-ray: no acute cardiopulmonary process   01-02-15: left hand x-ray: moderate osteoarthritis   02-10-15: kub: unremarkable abdomen exam  02-15-15: abdominal ultrasound: 1. Gallbladder not visualized. 2. No hydronephrosis no renal calculus; 3. The pancreas not well visualized; which could be related to gassy abdomen but an appearance which might represent pancreatitis in the appropriate clinical setting.   02-26-15: right lower extremity doppler: - No evidence of deep vein thrombosis involving the right lower   extremity. - No evidence of Baker&'s cyst on the right.  03-11-15: kub: Stable bowel gas pattern over this series of exams with continued gaseous distension of the redundant sigmoid colon. Favor chronic decreased colonic motility.  03-12-15: upper endoscopy: duodenal bulb 1.5-2.0 cm ulcer with clot at base. No active bleeding. Surrounding mucosa was edematous. Biopsy of the surrounding mucosa were done.   05-11-15: right ankle x-ray: normal     LABS REVIEWED:    10-13-14: wbc  4.7; hgb 9.7; hct 31.1; mcv 105.3; plt 111; glucose 176; bun 27.7; creat 1.44; k+4.4; na++143; C02: 35; liver normal albumin 4.5 12-21-14: wbc 5.1; hgb 9.7; hct 31.2; mcv 106.7; plt 129; glucose 217; bun 27.6; creat 1.37; k+ 3.8; na++135 01-06-15: hgb a1c 6.8; chol 178; ldl 110; trig 122; hdl 44 01-19-15: wbc 8.2; hgb 9.2; hct 31.4; mcv 90.2; plt 255; glucose 181; bun 24.4; creat 1.05; k+ 4.4; na++139 02-05-15: vit [id number]: [id number]; folic acid A999333; iron 64  02-11-15: wbc 5.3; hgb 10.2; hct 31.2; mcv 102.3; plt 145 glucose 136; bun 36; creat 1.59; k+ 4.0; na++136; liver normal albumin 3.9; lipase 20; sed rate 40  02-13-15: urine culture: gram neg rod  02-26-15: wbc 5.5 ;hgb 10.7; hct 32.1; mcv 102.9; plt 182; glucose 160; bun 23; creat 1.28; k+ 3.4; na++141; liver normal albumin 3.9; urine culture: 20,000 colonies e-coli 02-26-14: wbc 4.8; hgb 9.9; hct 30.2; mcv 103.4; plt 167; glucose 136; bun 20; creat 1.32; k+ 3.1; na++140; liver normal albumin 3.4 phos 3.6; mag 2.0; tsh 5.235  03-09-15: wbc 5.8; hgb 10.2; hct 30.3; mcv 103.1; plt 221; glucose 137; bun 14; creat 1.07; k+ 3.2; na++139; liver normal albumin 3.7; lipase 24; urine culture: e-coli: cipro 03-12-15: wbc 4.5 hgb 7.6; hct 23.0; mcv 101.3; plt 168; glucose 138; bun 40; creat 1.24; k+ 3.0; na++141; liver normal albumin 2.8 03-15-15: wbc 5.4; hgb 8.1; hct 25.1; mcv 105.5; plt 171; glucose 144; bun 13; creat 0.96; k+ 3.2; na++141  03-18-15: wbc 5.3; hgb 7.7; hct 24.9; mcv 105;  plt 188; glucose 149; bun 8.1; creat 1.12; k+ 3.6; na++143  04-30-15: H-pylori: neg      Review of Systems  Constitutional: Negative for appetite change and fatigue.  HENT: Negative for congestion.   Respiratory: Negative for cough, chest tightness and shortness of breath.   Cardiovascular: Negative for chest pain, palpitations and leg swelling.  Gastrointestinal: Negative for nausea, abdominal pain, diarrhea and constipation.  Musculoskeletal: no complaint of muscle or joint pain      Skin: Negative for pallor.  Neurological: Negative for dizziness.  Psychiatric/Behavioral: The patient is not nervous/anxious.       Physical Exam  Constitutional: No distress.  Eyes: Conjunctivae are normal.  Neck: Neck supple. No JVD present. No thyromegaly present.  Cardiovascular: Normal rate, regular rhythm and intact distal pulses.   Respiratory: Effort normal and breath sounds normal. No respiratory distress. She has no wheezes.  Is 02 dependent  GI: Soft. Bowel sounds are normal. She exhibits no distension. No tenderness Musculoskeletal: She exhibits no edema.  Able to move all extremities .   Lymphadenopathy:    She has no cervical adenopathy.  Neurological: She is alert.  Skin: Skin is warm and dry. She is not diaphoretic.  Psychiatric: She has a normal mood and affect.     ASSESSMENT/ PLAN:  1. Hypertension: will continue norvasc 2.5 mg daily coreg 3.15 mg daily   2. Chronic diastolic heart failure: will continue lasix 40 mg daily with k+ 20 meq daily   3. Duodenal ulcer: will continue protonix 40 mg  daily    4. UTI: will continue cranberry daily    5. Anemia: will continue iron daily hgb is 8.1  6. Chronic afib: will continue coreg 3.125 mg daily for rate control. Will continue asa 81mg  daily .  She is no longer on plavix    7. Chronic respiratory failure: is 02 dependent.   8. Allergic rhinitis: will continue flonase nightly and claritin 10 mg daily   9. Chronic pain due to osteoarthritis: will continue lidoderm to lower back daily; will continue baclofen 5 mg nightly for leg spasticity and will monitor   10. Depression: is stable receives benefit form lexapro 10 mg daily has trazodone 25 mg nightly as needed for sleep   11. Chronic constipation: will continue colace 200 mg daily linzess 145 mcg daily; senna twice daily miralax daily   12. Glaucoma: will continue  trusopt to both eyes three times daily; travatan to both eyes nightly   13. Low back  pain with radiculopathy: will continue neurontin 100 mg nightly for her bilateral foot burning/pain   14. Depression: will continue lexapro 10 mg daily            Ok Edwards NP [facility]  Contact [phone] Monday through Friday 8am- 5pm  After hours call [phone]

## 2015-10-14 DIAGNOSIS — Z79899 Other long term (current) drug therapy: Secondary | ICD-10-CM | POA: Diagnosis not present

## 2015-10-14 LAB — BASIC METABOLIC PANEL
BUN: 16 mg/dL (ref 4–21)
CREATININE: 1.3 mg/dL — AB (ref 0.5–1.1)
GLUCOSE: 132 mg/dL
POTASSIUM: 3.8 mmol/L (ref 3.4–5.3)
Sodium: 143 mmol/L (ref 137–147)

## 2015-10-14 LAB — HEPATIC FUNCTION PANEL
ALT: 11 U/L (ref 7–35)
AST: 16 U/L (ref 13–35)
Alkaline Phosphatase: 104 U/L (ref 25–125)
Bilirubin, Total: 0.4 mg/dL

## 2015-10-14 LAB — CBC AND DIFFERENTIAL
HCT: 34 % — AB (ref 36–46)
Hemoglobin: 11.5 g/dL — AB (ref 12.0–16.0)
Platelets: 127 10*3/uL — AB (ref 150–399)
WBC: 5 10*3/mL

## 2015-10-28 ENCOUNTER — Non-Acute Institutional Stay (SKILLED_NURSING_FACILITY): Payer: Medicare Other | Admitting: Internal Medicine

## 2015-10-28 ENCOUNTER — Encounter: Payer: Self-pay | Admitting: Internal Medicine

## 2015-10-28 DIAGNOSIS — N9489 Other specified conditions associated with female genital organs and menstrual cycle: Secondary | ICD-10-CM | POA: Diagnosis not present

## 2015-10-28 NOTE — Progress Notes (Signed)
MRN: [id number] Name: Maria Christian  Sex: female Age: 82 y.o. DOB: [birth year]  [place] #: [name] Facility/Room: 220 Level Of Care: SNF Provider: Noah Delaine. Sheppard Coil, MD Emergency Contacts: Extended Emergency Contact Information Primary Emergency Contact: [name] [place] Phone: [phone] Relation: Son Secondary Emergency Contact: Roberts,Lynn Address: [address]          [place], [place] [address] [name] of [place] Phone: [phone] Mobile Phone: [phone] Relation: Daughter  Code Status: DNR  Allergies: Aspirin  Chief Complaint  Patient presents with  . Acute Visit    Acute    HPI: Patient is 82 y.o. female who is being seen acutely for swelling in her labia for 1-2 days. Pt admits it feels like stinging, may be itchy.per nursing clotrimazole cream has been used on it but has been used prior. No known other new exposures. No feer dysuria or mental status changes.  Past Medical History  Diagnosis Date  . Atrial fibrillation ([place])   . Altered mental status   . Encephalopathy   . Parkinson disease ([place])   . Hypertension   . GERD (gastroesophageal reflux disease)   . Hyperlipemia   . Diabetes mellitus   . Coronary artery disease   . History of adenomatous polyp of colon 06/24/99  . Enlarged heart   . DEMENTIA   . Edema of lower extremity 07/13/11    right leg more swollen than left leg  . Esophageal dysmotility 07/02/12  . Horseshoe kidney   . Pancreatic lesion 05/22/11    no further workup  per PCP/family due to age  . Anemia   . Peripheral neuropathy ([place])   . Depression   . Thrombophlebitis   . TIA (transient ischemic attack) 12/19/2012  . Sigmoid volvulus ([place]) 05/24/2013  . Constipation 02/27/2015    Past Surgical History  Procedure Laterality Date  . Shoulder surgery  2001    left clavicle excision and acromioplasty  . Abdominal hysterectomy    . Cholecystectomy    . Total hip arthroplasty    . Breast  surgery      2 benign tumors removed left breast  . Foot surgery      benign tumors from foot  . Esophageal dilation      several times by Dr. [name]  . Nose surgery    . Esophagogastroduodenoscopy (egd) with esophageal dilation N/A 08/01/2012    Procedure: ESOPHAGOGASTRODUODENOSCOPY (EGD) WITH ESOPHAGEAL DILATION;  Surgeon: [name], MD;  [facility];  Service: Endoscopy;  Laterality: N/A;  with c-arm savory dilators  . Flexible sigmoidoscopy N/A 05/24/2013    Procedure: FLEXIBLE SIGMOIDOSCOPY;  Surgeon: [name], MD;  Location: [facility];  Service: Endoscopy;  Laterality: N/A;  . Flexible sigmoidoscopy N/A 05/26/2013    Procedure:  flex with decompression of sigmoid volvulus;  Surgeon: [name], MD;  Location: [facility];  Service: Endoscopy;  Laterality: N/A;  . Esophagogastroduodenoscopy (egd) with propofol N/A 03/12/2015    Procedure: ESOPHAGOGASTRODUODENOSCOPY (EGD) WITH PROPOFOL;  Surgeon: [name], MD;  [facility];  Service: Endoscopy;  Laterality: N/A;      Medication List       This list is accurate as of: 10/28/15 11:59 PM.  Always use your most recent med list.               acetaminophen 325 MG tablet  Commonly known as:  TYLENOL  Take 2 tablets (650 mg total) by mouth every 6 (  six) hours as needed for mild pain.     amLODipine 2.5 MG tablet  Commonly known as:  NORVASC  Take 2.5 mg by mouth every morning. Hold for BP < 100/60 or HR < 60     aspirin 81 MG tablet  Take 81 mg by mouth daily.     baclofen 10 MG tablet  Commonly known as:  LIORESAL  Take 5 mg by mouth at bedtime.     Biotin 5 MG/ML Liqd  Take by mouth 2 (two) times daily.     carvedilol 3.125 MG tablet  Commonly known as:  COREG  Take 3.125 mg by mouth daily.     CERTAGEN PO  Take 1 tablet by mouth daily.     cholecalciferol 1000 units tablet  Commonly known as:  VITAMIN D  Take 1,000 Units by mouth every morning.     clotrimazole 1  % cream  Commonly known as:  LOTRIMIN  Apply to buttocks, labia topically every day shift for rash then apply to buttocks labia topically in the evening for rash     Cranberry 475 MG Caps  Take 1 capsule by mouth 2 (two) times daily.     docusate sodium 100 MG capsule  Commonly known as:  COLACE  Take 200 mg by mouth at bedtime.     dorzolamide 2 % ophthalmic solution  Commonly known as:  TRUSOPT  Place 1 drop into both eyes 3 (three) times daily.     escitalopram 10 MG tablet  Commonly known as:  LEXAPRO  Take 1 tablet (10 mg total) by mouth daily.     ferrous sulfate 325 (65 FE) MG tablet  Take 325 mg by mouth daily with breakfast.     fluticasone 50 MCG/ACT nasal spray  Commonly known as:  FLONASE  Place 2 sprays into both nostrils at bedtime.     furosemide 40 MG tablet  Commonly known as:  LASIX  Take 1 tablet (40 mg total) by mouth daily.     gabapentin 100 MG capsule  Commonly known as:  NEURONTIN  Take 100 mg by mouth at bedtime.     lidocaine 5 %  Commonly known as:  LIDODERM  Place 1 patch onto the skin daily. Apply 1 patch to left lower back/hip every morning.  Remove & Discard patch within 12 hours or as directed by MD     LINZESS 145 MCG Caps capsule  Generic drug:  linaclotide  Take 145 mcg by mouth daily.     loratadine 10 MG tablet  Commonly known as:  CLARITIN  Take 10 mg by mouth daily.     ondansetron 8 MG tablet  Commonly known as:  ZOFRAN  Take 8 mg by mouth every 8 (eight) hours as needed for nausea or vomiting.     OXYGEN  Inhale 2 L into the lungs continuous.     pantoprazole 40 MG tablet  Commonly known as:  PROTONIX  Take 40 mg by mouth daily.     polyethylene glycol packet  Commonly known as:  MIRALAX / GLYCOLAX  Take 17 g by mouth daily.     potassium chloride SA 20 MEQ tablet  Commonly known as:  K-DUR,KLOR-CON  Take 20 mEq by mouth daily.     senna 8.6 MG Tabs tablet  Commonly known as:  SENOKOT  Take 1 tablet (8.6 mg  total) by mouth 2 (two) times daily.     sodium chloride 0.65 % Soln nasal spray  Commonly known as:  OCEAN  Place 1 spray into both nostrils as directed. Four times daily And every 24 hours as needed to moisturize nasal passages.     Travoprost (BAK Free) 0.004 % Soln ophthalmic solution  Commonly known as:  TRAVATAN  Place 1 drop into both eyes at bedtime.     vitamin B-12 500 MCG tablet  Commonly known as:  CYANOCOBALAMIN  Take 500 mcg by mouth every morning.        Meds ordered this encounter  Medications  . OXYGEN    Sig: Inhale 2 L into the lungs continuous.  Marland Kitchen amLODipine (NORVASC) 2.5 MG tablet    Sig: Take 2.5 mg by mouth every morning. Hold for BP < 100/60 or HR < 60  . pantoprazole (PROTONIX) 40 MG tablet    Sig: Take 40 mg by mouth daily.    Immunization History  Administered Date(s) Administered  . Influenza-Unspecified 07/13/2015  . PPD Test 07/28/2013    Social History  Substance Use Topics  . Smoking status: Former Smoker    Quit date: 10/09/1964  . Smokeless tobacco: Never Used  . Alcohol Use: No    Review of Systems  DATA OBTAINED: from patient, nurse- as per HPI GENERAL:  no fevers, fatigue, appetite changes SKIN: No itching, rash HEENT: No complaint RESPIRATORY: No cough, wheezing, SOB CARDIAC: No chest pain, palpitations, lower extremity edema  GI: No abdominal pain, No N/V/D or constipation, No heartburn or reflux  GU: No dysuria, frequency or urgency, or incontinence ; swelling of labia MUSCULOSKELETAL: No unrelieved bone/joint pain NEUROLOGIC: No headache, dizziness  PSYCHIATRIC: No overt anxiety or sadness  Filed Vitals:   10/28/15 1402  BP: 135/70  Pulse: 66  Temp: 96.8 F (36 C)  Resp: 18    Physical Exam  GENERAL APPEARANCE: Alert, conversant, No acute distress ; exam is a lengthy process;faim;ly in room has to leave, then standing lift brought in, pt attached by straps to it and then I can get to area I need to see SKIN:  No diaphoresis rash HEENT: Unremarkable RESPIRATORY: Breathing is even, unlabored. Lung sounds are clear   CARDIOVASCULAR: Heart RRR no murmurs, rubs or gallops. No peripheral edema  GASTROINTESTINAL: Abdomen is soft, non-tender, not distended w/ normal bowel sounds.  GENITOURINARY: Bladder non tender, not distended ; R labia with mild swelling, no redness or heat, no open areas, areas of abrasion no  lesions MUSCULOSKELETAL: No abnormal joints or musculature NEUROLOGIC: Cranial nerves 2-12 grossly intact. Moves all extremities PSYCHIATRIC: Mood and affect appropriate to situation with dementia, no behavioral issues  Patient Active Problem List   Diagnosis Date Noted  . Vitamin B12 deficiency 09/27/2015  . Skin yeast infection 05/10/2015  . Abrasion of buttock 05/10/2015  . Infection due to ESBL-producing Escherichia coli 04/19/2015  . Low back pain potentially associated with radiculopathy 04/18/2015  . Chronic respiratory failure ([place]) 03/16/2015  . Allergic rhinitis 03/16/2015  . Chronic pain 03/16/2015  . Osteoarthritis of multiple joints 03/16/2015  . Duodenal ulcer 03/12/2015  . GI bleed 03/11/2015  . Melena 03/11/2015  . Pressure ulcer 03/11/2015  . Headache 03/09/2015  . Abdominal distension 03/01/2015  . Cephalgia 03/01/2015  . Nausea and vomiting 02/27/2015  . Constipation 02/27/2015  . Benign essential HTN 02/27/2015  . Anemia, iron deficiency 02/27/2015  . Anemia of chronic disease 02/27/2015  . Chronic atrial fibrillation ([place]) 02/27/2015  . Hypokalemia 02/27/2015  . CKD (chronic kidney disease) stage 3, GFR 30-59 ml/min 02/27/2015  .  Ileus ([place]) 02/26/2015  . Malnutrition of moderate degree ([place]) 10/10/2013  . FTT (failure to thrive) in adult 07/29/2013  . Dementia 12/19/2012  . Depression 12/19/2012  . Coronary artery disease   . Diastolic CHF, chronic ([facility]) 05/24/2011    CBC    Component Value Date/Time   WBC 5.0 10/14/2015   WBC 4.3 04/19/2015 1405    RBC 2.78* 04/19/2015 1405   RBC 2.33* 10/17/2013 0342   HGB 11.5* 10/14/2015   HCT 34* 10/14/2015   PLT 127* 10/14/2015   MCV 101.5* 04/19/2015 1405   LYMPHSABS 1.1 04/19/2015 1405   MONOABS 0.4 04/19/2015 1405   EOSABS 0.3 04/19/2015 1405   BASOSABS 0.0 04/19/2015 1405    CMP     Component Value Date/Time   NA 143 10/14/2015   NA 141 03/15/2015 0522   K 3.8 10/14/2015   CL 116* 03/15/2015 0522   CO2 20* 03/15/2015 0522   GLUCOSE 144* 03/15/2015 0522   BUN 16 10/14/2015   BUN 13 03/15/2015 0522   CREATININE 1.3* 10/14/2015   CREATININE 0.96 03/15/2015 0522   CALCIUM 8.3* 03/15/2015 0522   PROT 5.6* 03/12/2015 0525   ALBUMIN 2.8* 03/12/2015 0525   AST 16 10/14/2015   ALT 11 10/14/2015   ALKPHOS 104 10/14/2015   BILITOT 0.6 03/12/2015 0525   GFRNONAA 51* 03/15/2015 0522   GFRAA 59* 03/15/2015 0522    Assessment and Plan  SWELLING R LABIA - VERY MILD, NO REDNESS OR OTHER ACUTE FINDING OTHERWISE; - lotrisone cream BID for 7 days; will follow  Time spent > 25 mion;> 50% of time with patient was spent reviewing records, labs, tests and studies, counseling and developing plan of care  Webb Silversmith D. Sheppard Coil, MD

## 2015-10-30 ENCOUNTER — Encounter: Payer: Self-pay | Admitting: Internal Medicine

## 2015-11-04 ENCOUNTER — Encounter: Payer: Self-pay | Admitting: Internal Medicine

## 2015-11-04 ENCOUNTER — Non-Acute Institutional Stay (SKILLED_NURSING_FACILITY): Payer: Medicare Other | Admitting: Internal Medicine

## 2015-11-04 DIAGNOSIS — R05 Cough: Secondary | ICD-10-CM

## 2015-11-04 DIAGNOSIS — R059 Cough, unspecified: Secondary | ICD-10-CM

## 2015-11-04 NOTE — Progress Notes (Signed)
MRN: [id number] Name: Maria Christian  Sex: female Age: 82 y.o. DOB: August 06, 1924  [place] #:  Facility/Room: [facility] / 220 Level Of Care: SNF Provider: Noah Delaine. Sheppard Coil, MD Emergency Contacts: Extended Emergency Contact Information Primary Emergency Contact: [name] [place] of [place] Phone: [phone] Relation: Son Secondary Emergency Contact: Roberts,Lynn Address: [address]          [place], [place] [address] [name] of [place] Phone: [phone] Mobile Phone: [phone] Relation: Daughter  Code Status: DNR  Allergies: Aspirin  Chief Complaint  Patient presents with  . Acute Visit    Acute    HPI: Patient is 82 y.o. female who nsursing asked me to see for a new onset cough for 2 day. No reported fever or mental status change. Pt says she is a little SOB but her O2 sat is 98%. Cough sounds productive but pt is not coughing up anything.  Past Medical History  Diagnosis Date  . Atrial fibrillation ([place])   . Altered mental status   . Encephalopathy   . Parkinson disease ([place])   . Hypertension   . GERD (gastroesophageal reflux disease)   . Hyperlipemia   . Diabetes mellitus   . Coronary artery disease   . History of adenomatous polyp of colon 06/24/99  . Enlarged heart   . DEMENTIA   . Edema of lower extremity 07/13/11    right leg more swollen than left leg  . Esophageal dysmotility 07/02/12  . Horseshoe kidney   . Pancreatic lesion 05/22/11    no further workup  per PCP/family due to age  . Anemia   . Peripheral neuropathy ([place])   . Depression   . Thrombophlebitis   . TIA (transient ischemic attack) 12/19/2012  . Sigmoid volvulus ([place]) 05/24/2013  . Constipation 02/27/2015    Past Surgical History  Procedure Laterality Date  . Shoulder surgery  2001    left clavicle excision and acromioplasty  . Abdominal hysterectomy    . Cholecystectomy    . Total hip arthroplasty    . Breast surgery      2 benign tumors removed left  breast  . Foot surgery      benign tumors from foot  . Esophageal dilation      several times by Dr. [name]  . Nose surgery    . Esophagogastroduodenoscopy (egd) with esophageal dilation N/A 08/01/2012    Procedure: ESOPHAGOGASTRODUODENOSCOPY (EGD) WITH ESOPHAGEAL DILATION;  Surgeon: [name], MD;  [facility];  Service: Endoscopy;  Laterality: N/A;  with c-arm savory dilators  . Flexible sigmoidoscopy N/A 05/24/2013    Procedure: FLEXIBLE SIGMOIDOSCOPY;  Surgeon: [name], MD;  [facility];  Service: Endoscopy;  Laterality: N/A;  . Flexible sigmoidoscopy N/A 05/26/2013    Procedure:  flex with decompression of sigmoid volvulus;  Surgeon: [name], MD;  [facility];  Service: Endoscopy;  Laterality: N/A;  . Esophagogastroduodenoscopy (egd) with propofol N/A 03/12/2015    Procedure: ESOPHAGOGASTRODUODENOSCOPY (EGD) WITH PROPOFOL;  Surgeon: [name], MD;  [facility];  Service: Endoscopy;  Laterality: N/A;      Medication List       This list is accurate as of: 11/04/15 11:59 PM.  Always use your most recent med list.               acetaminophen 325 MG tablet  Commonly known as:  TYLENOL  Take 2 tablets (650 mg total) by mouth every 6 (six)  hours as needed for mild pain.     amLODipine 2.5 MG tablet  Commonly known as:  NORVASC  Take 2.5 mg by mouth every morning. Hold for BP < 100/60 or HR < 60     aspirin 81 MG tablet  Take 81 mg by mouth daily.     baclofen 10 MG tablet  Commonly known as:  LIORESAL  Take 5 mg by mouth at bedtime.     Biotin 5 MG/ML Liqd  Take by mouth 2 (two) times daily.     carvedilol 3.125 MG tablet  Commonly known as:  COREG  Take 3.125 mg by mouth daily.     CERTAGEN PO  Take 1 tablet by mouth daily.     cholecalciferol 1000 units tablet  Commonly known as:  VITAMIN D  Take 1,000 Units by mouth every morning.     clotrimazole 1 % cream  Commonly known as:  LOTRIMIN   Apply 1 application topically 2 (two) times daily. Apply to buttocks , labia topically every shift for rash then apply to buttocks, labia in the evening for rash.     Cranberry 475 MG Caps  Take 1 capsule by mouth 2 (two) times daily.     docusate sodium 100 MG capsule  Commonly known as:  COLACE  Take 200 mg by mouth at bedtime.     dorzolamide 2 % ophthalmic solution  Commonly known as:  TRUSOPT  Place 1 drop into both eyes 3 (three) times daily. Reported on 11/16/2015     escitalopram 10 MG tablet  Commonly known as:  LEXAPRO  Take 1 tablet (10 mg total) by mouth daily.     ferrous sulfate 325 (65 FE) MG tablet  Take 325 mg by mouth daily with breakfast.     fluticasone 50 MCG/ACT nasal spray  Commonly known as:  FLONASE  Place 2 sprays into both nostrils at bedtime.     furosemide 40 MG tablet  Commonly known as:  LASIX  Take 1 tablet (40 mg total) by mouth daily.     gabapentin 100 MG capsule  Commonly known as:  NEURONTIN  Take 100 mg by mouth at bedtime.     lidocaine 5 %  Commonly known as:  LIDODERM  Place 1 patch onto the skin daily. Apply 1 patch to left lower back/hip every morning.  Remove & Discard patch within 12 hours or as directed by MD     LINZESS 145 MCG Caps capsule  Generic drug:  linaclotide  Take 145 mcg by mouth daily.     loratadine 10 MG tablet  Commonly known as:  CLARITIN  Take 10 mg by mouth daily.     ondansetron 8 MG tablet  Commonly known as:  ZOFRAN  Take 8 mg by mouth every 8 (eight) hours as needed for nausea or vomiting.     OXYGEN  Inhale 2 L into the lungs continuous.     pantoprazole 40 MG tablet  Commonly known as:  PROTONIX  Take 40 mg by mouth daily.     polyethylene glycol packet  Commonly known as:  MIRALAX / GLYCOLAX  Take 17 g by mouth daily.     potassium chloride SA 20 MEQ tablet  Commonly known as:  K-DUR,KLOR-CON  Take 20 mEq by mouth daily.     senna 8.6 MG Tabs tablet  Commonly known as:  SENOKOT   Take 1 tablet (8.6 mg total) by mouth 2 (two) times daily.  sodium chloride 0.65 % Soln nasal spray  Commonly known as:  OCEAN  Place 1 spray into both nostrils as directed. Four times daily And every 24 hours as needed to moisturize nasal passages.     Travoprost (BAK Free) 0.004 % Soln ophthalmic solution  Commonly known as:  TRAVATAN  Place 1 drop into both eyes at bedtime.     vitamin B-12 500 MCG tablet  Commonly known as:  CYANOCOBALAMIN  Take 500 mcg by mouth every morning.        Meds ordered this encounter  Medications  . clotrimazole (LOTRIMIN) 1 % cream    Sig: Apply 1 application topically 2 (two) times daily. Apply to buttocks , labia topically every shift for rash then apply to buttocks, labia in the evening for rash.    Immunization History  Administered Date(s) Administered  . Influenza-Unspecified 07/13/2015  . PPD Test 07/28/2013    Social History  Substance Use Topics  . Smoking status: Former Smoker    Quit date: 10/09/1964  . Smokeless tobacco: Never Used  . Alcohol Use: No    Review of Systems  DATA OBTAINED: from patient, nurse GENERAL:  no fevers, fatigue, appetite changes SKIN: No itching, rash HEENT: No complaint RESPIRATORY: + cough, no wheezing, +SOB CARDIAC: No chest pain, palpitations, lower extremity edema  GI: No abdominal pain, No N/V/D or constipation, No heartburn or reflux  GU: No dysuria, frequency or urgency, or incontinence  MUSCULOSKELETAL: No unrelieved bone/joint pain NEUROLOGIC: No headache, dizziness  PSYCHIATRIC: No overt anxiety or sadness  Filed Vitals:   11/04/15 1520  BP: 143/55  Pulse: 66  Temp: 97.1 F (36.2 C)  Resp: 18    Physical Exam  GENERAL APPEARANCE: Alert, conversant, No acute distress  SKIN: No diaphoresis rash HEENT: Unremarkable RESPIRATORY: Breathing is even, unlabored. Lung sounds are slt coarse , no wheezing or rales; RA O2 sat 98%  CARDIOVASCULAR: Heart RRR no murmurs, rubs or  gallops. No peripheral edema  GASTROINTESTINAL: Abdomen is soft, non-tender, not distended w/ normal bowel sounds.  GENITOURINARY: Bladder non tender, not distended  MUSCULOSKELETAL: No abnormal joints or musculature NEUROLOGIC: Cranial nerves 2-12 grossly intact. Moves all extremities PSYCHIATRIC: Mood and affect appropriate to situation, no behavioral issues  Patient Active Problem List   Diagnosis Date Noted  . Vitamin B12 deficiency 09/27/2015  . Skin yeast infection 05/10/2015  . Abrasion of buttock 05/10/2015  . Infection due to ESBL-producing Escherichia coli 04/19/2015  . Low back pain potentially associated with radiculopathy 04/18/2015  . Chronic respiratory failure ([place]) 03/16/2015  . Allergic rhinitis 03/16/2015  . Chronic pain 03/16/2015  . Osteoarthritis of multiple joints 03/16/2015  . Duodenal ulcer 03/12/2015  . GI bleed 03/11/2015  . Melena 03/11/2015  . Pressure ulcer 03/11/2015  . Headache 03/09/2015  . Abdominal distension 03/01/2015  . Cephalgia 03/01/2015  . Nausea and vomiting 02/27/2015  . Constipation 02/27/2015  . Benign essential HTN 02/27/2015  . Anemia, iron deficiency 02/27/2015  . Anemia of chronic disease 02/27/2015  . Chronic atrial fibrillation ([place]) 02/27/2015  . Hypokalemia 02/27/2015  . CKD (chronic kidney disease) stage 3, GFR 30-59 ml/min 02/27/2015  . Ileus ([place]) 02/26/2015  . Malnutrition of moderate degree ([place]) 10/10/2013  . FTT (failure to thrive) in adult 07/29/2013  . Dementia 12/19/2012  . Depression 12/19/2012  . Coronary artery disease   . Diastolic CHF, chronic ([place]) 05/24/2011    CBC    Component Value Date/Time   WBC 5.0 10/14/2015  WBC 4.3 04/19/2015 1405   RBC 2.78* 04/19/2015 1405   RBC 2.33* 10/17/2013 0342   HGB 11.5* 10/14/2015   HCT 34* 10/14/2015   PLT 127* 10/14/2015   MCV 101.5* 04/19/2015 1405   LYMPHSABS 1.1 04/19/2015 1405   MONOABS 0.4 04/19/2015 1405   EOSABS 0.3 04/19/2015 1405   BASOSABS 0.0  04/19/2015 1405    CMP     Component Value Date/Time   NA 143 10/14/2015   NA 141 03/15/2015 0522   K 3.8 10/14/2015   CL 116* 03/15/2015 0522   CO2 20* 03/15/2015 0522   GLUCOSE 144* 03/15/2015 0522   BUN 16 10/14/2015   BUN 13 03/15/2015 0522   CREATININE 1.3* 10/14/2015   CREATININE 0.96 03/15/2015 0522   CALCIUM 8.3* 03/15/2015 0522   PROT 5.6* 03/12/2015 0525   ALBUMIN 2.8* 03/12/2015 0525   AST 16 10/14/2015   ALT 11 10/14/2015   ALKPHOS 104 10/14/2015   BILITOT 0.6 03/12/2015 0525   GFRNONAA 51* 03/15/2015 0522   GFRAA 59* 03/15/2015 0522    Assessment and Plan  COUGH- have started robitussin DM for 10 days scheduled and mucinex 600 mg BID for 10 days; have ordered CXR;  will plan to tx  If CXR + or pt is not improving or worsens  Anne D. Sheppard Coil, MD

## 2015-11-16 ENCOUNTER — Non-Acute Institutional Stay (SKILLED_NURSING_FACILITY): Payer: Medicare Other | Admitting: Adult Health

## 2015-11-16 ENCOUNTER — Encounter: Payer: Self-pay | Admitting: Adult Health

## 2015-11-16 DIAGNOSIS — I482 Chronic atrial fibrillation, unspecified: Secondary | ICD-10-CM

## 2015-11-16 DIAGNOSIS — J301 Allergic rhinitis due to pollen: Secondary | ICD-10-CM | POA: Diagnosis not present

## 2015-11-16 DIAGNOSIS — K269 Duodenal ulcer, unspecified as acute or chronic, without hemorrhage or perforation: Secondary | ICD-10-CM

## 2015-11-16 DIAGNOSIS — M545 Low back pain, unspecified: Secondary | ICD-10-CM

## 2015-11-16 DIAGNOSIS — N183 Chronic kidney disease, stage 3 unspecified: Secondary | ICD-10-CM

## 2015-11-16 DIAGNOSIS — J9610 Chronic respiratory failure, unspecified whether with hypoxia or hypercapnia: Secondary | ICD-10-CM | POA: Diagnosis not present

## 2015-11-16 DIAGNOSIS — I5032 Chronic diastolic (congestive) heart failure: Secondary | ICD-10-CM

## 2015-11-16 DIAGNOSIS — I110 Hypertensive heart disease with heart failure: Secondary | ICD-10-CM | POA: Diagnosis not present

## 2015-11-16 DIAGNOSIS — D509 Iron deficiency anemia, unspecified: Secondary | ICD-10-CM

## 2015-11-16 NOTE — Progress Notes (Signed)
Patient ID: Maria Christian, female   DOB: 29-Aug-1924, 82 y.o.   MRN: [id number]    Location:  [facility] Room Number: [id number] Place of Service:  SNF (31)   CODE STATUS: DNR  Allergies  Allergen Reactions  . Aspirin Other (See Comments)    [name] only    Chief Complaint  Patient presents with  . Medical Management of Chronic Issues    Follow up    HPI:  She is a long term resident of this facility being seen for the management of her chronic illnesses. Overall her status is stable. She does attend activities. She tells me that she is feeling good and has not concerns today. There are no nursing concerns at this time.    Past Medical History  Diagnosis Date  . Atrial fibrillation ([place])   . Altered mental status   . Encephalopathy   . Parkinson disease ([place])   . Hypertension   . GERD (gastroesophageal reflux disease)   . Hyperlipemia   . Diabetes mellitus   . Coronary artery disease   . History of adenomatous polyp of colon 06/24/99  . Enlarged heart   . DEMENTIA   . Edema of lower extremity 07/13/11    right leg more swollen than left leg  . Esophageal dysmotility 07/02/12  . Horseshoe kidney   . Pancreatic lesion 05/22/11    no further workup  per PCP/family due to age  . Anemia   . Peripheral neuropathy ([place])   . Depression   . Thrombophlebitis   . TIA (transient ischemic attack) 12/19/2012  . Sigmoid volvulus ([place]) 05/24/2013  . Constipation 02/27/2015    Past Surgical History  Procedure Laterality Date  . Shoulder surgery  2001    left clavicle excision and acromioplasty  . Abdominal hysterectomy    . Cholecystectomy    . Total hip arthroplasty    . Breast surgery      2 benign tumors removed left breast  . Foot surgery      benign tumors from foot  . Esophageal dilation      several times by Dr. [name]  . Nose surgery    . Esophagogastroduodenoscopy (egd) with esophageal dilation N/A 08/01/2012    Procedure:  ESOPHAGOGASTRODUODENOSCOPY (EGD) WITH ESOPHAGEAL DILATION;  Surgeon: [name], MD;  [facility];  Service: Endoscopy;  Laterality: N/A;  with c-arm savory dilators  . Flexible sigmoidoscopy N/A 05/24/2013    Procedure: FLEXIBLE SIGMOIDOSCOPY;  Surgeon: [name], MD;  [facility];  Service: Endoscopy;  Laterality: N/A;  . Flexible sigmoidoscopy N/A 05/26/2013    Procedure:  flex with decompression of sigmoid volvulus;  Surgeon: [name], MD;  [facility];  Service: Endoscopy;  Laterality: N/A;  . Esophagogastroduodenoscopy (egd) with propofol N/A 03/12/2015    Procedure: ESOPHAGOGASTRODUODENOSCOPY (EGD) WITH PROPOFOL;  Surgeon: [name], MD;  [facility];  Service: Endoscopy;  Laterality: N/A;    Social History   Social History  . Marital Status: Widowed    Spouse Name: N/A  . Number of Children: 5  . Years of Education: 10th   Occupational History  . Retired    Social History Main Topics  . Smoking status: Former Smoker    Quit date: 10/09/1964  . Smokeless tobacco: Never Used  . Alcohol Use: No  . Drug Use: No  . Sexual Activity: No   Other Topics Concern  . Not on file  Social History Narrative   Pt lives at [facility] and [place].   Caffeine Use: very small amount daily   Family History  Problem Relation Age of Onset  . Cancer    . Heart disease        VITAL SIGNS BP 138/71 mmHg  Pulse 63  Temp(Src) 97.5 F (36.4 C) (Oral)  Resp 18  Ht 5\' 7"  (1.702 m)  Wt 152 lb (68.947 kg)  BMI 23.80 kg/m2  SpO2 98%  Patient's Medications  New Prescriptions   No medications on file  Previous Medications   ACETAMINOPHEN (TYLENOL) 325 MG TABLET    Take 2 tablets (650 mg total) by mouth every 6 (six) hours as needed for mild pain.   AMLODIPINE (NORVASC) 2.5 MG TABLET    Take 2.5 mg by mouth every morning. Hold for BP < 100/60 or HR < 60   ASPIRIN 81 MG TABLET    Take 81 mg by mouth daily.    BACLOFEN (LIORESAL) 10 MG TABLET    Take 5 mg by mouth at bedtime.   BIOTIN 5 MG/ML LIQD    Take by mouth 2 (two) times daily.   CARVEDILOL (COREG) 3.125 MG TABLET    Take 3.125 mg by mouth daily.   CHOLECALCIFEROL (VITAMIN D) 1000 UNITS TABLET    Take 1,000 Units by mouth every morning.    CLOTRIMAZOLE (LOTRIMIN) 1 % CREAM    Apply 1 application topically 2 (two) times daily. Apply to buttocks , labia topically every shift for rash then apply to buttocks, labia in the evening for rash.   CRANBERRY 475 MG CAPS    Take 1 capsule by mouth 2 (two) times daily.   DOCUSATE SODIUM (COLACE) 100 MG CAPSULE    Take 200 mg by mouth at bedtime.    DORZOLAMIDE (TRUSOPT) 2 % OPHTHALMIC SOLUTION    Place 1 drop into both eyes 3 (three) times daily. Reported on 11/16/2015   ESCITALOPRAM (LEXAPRO) 10 MG TABLET    Take 1 tablet (10 mg total) by mouth daily.   FERROUS SULFATE 325 (65 FE) MG TABLET    Take 325 mg by mouth daily with breakfast.   FLUTICASONE (FLONASE) 50 MCG/ACT NASAL SPRAY    Place 2 sprays into both nostrils at bedtime.   FUROSEMIDE (LASIX) 40 MG TABLET    Take 1 tablet (40 mg total) by mouth daily.   GABAPENTIN (NEURONTIN) 100 MG CAPSULE    Take 100 mg by mouth at bedtime.   LIDOCAINE (LIDODERM) 5 %    Place 1 patch onto the skin daily. Apply 1 patch to left lower back/hip every morning.  Remove & Discard patch within 12 hours or as directed by MD   LINACLOTIDE (LINZESS) 145 MCG CAPS CAPSULE    Take 145 mcg by mouth daily.   LORATADINE (CLARITIN) 10 MG TABLET    Take 10 mg by mouth daily.   MULTIPLE VITAMINS-MINERALS (CERTAGEN PO)    Take 1 tablet by mouth daily.    ONDANSETRON (ZOFRAN) 8 MG TABLET    Take 8 mg by mouth every 8 (eight) hours as needed for nausea or vomiting.   OXYGEN    Inhale 2 L into the lungs continuous.   PANTOPRAZOLE (PROTONIX) 40 MG TABLET    Take 40 mg by mouth daily.   POLYETHYLENE GLYCOL (MIRALAX / GLYCOLAX) PACKET    Take 17 g by mouth daily.   POTASSIUM CHLORIDE SA  (K-DUR,KLOR-CON) 20 MEQ TABLET  Take 20 mEq by mouth daily.   SENNA (SENOKOT) 8.6 MG TABS TABLET    Take 1 tablet (8.6 mg total) by mouth 2 (two) times daily.   SODIUM CHLORIDE (OCEAN) 0.65 % SOLN NASAL SPRAY    Place 1 spray into both nostrils as directed. Four times daily And every 24 hours as needed to moisturize nasal passages.   TRAVOPROST, BAK FREE, (TRAVATAN) 0.004 % SOLN OPHTHALMIC SOLUTION    Place 1 drop into both eyes at bedtime.    VITAMIN B-12 (CYANOCOBALAMIN) 500 MCG TABLET    Take 500 mcg by mouth every morning.   Modified Medications   No medications on file  Discontinued Medications   No medications on file     SIGNIFICANT DIAGNOSTIC EXAMS  07-16-14: right hand x-ray: no acute abnormalities   10-13-14: chest x-ray: no acute cardiopulmonary process present.   12-16-14: chest x-ray: moderate cardiomegaly; with mild pulmonary venous congestion  12-21-14: chest x-ray: no acute cardiopulmonary process   01-02-15: left hand x-ray: moderate osteoarthritis   02-10-15: kub: unremarkable abdomen exam  02-15-15: abdominal ultrasound: 1. Gallbladder not visualized. 2. No hydronephrosis no renal calculus; 3. The pancreas not well visualized; which could be related to gassy abdomen but an appearance which might represent pancreatitis in the appropriate clinical setting.   02-26-15: right lower extremity doppler: - No evidence of deep vein thrombosis involving the right lower   extremity. - No evidence of Baker&'s cyst on the right.  03-11-15: kub: Stable bowel gas pattern over this series of exams with continued gaseous distension of the redundant sigmoid colon. Favor chronic decreased colonic motility.  03-12-15: upper endoscopy: duodenal bulb 1.5-2.0 cm ulcer with clot at base. No active bleeding. Surrounding mucosa was edematous. Biopsy of the surrounding mucosa were done.   05-11-15: right ankle x-ray: normal     LABS REVIEWED:    12-21-14: wbc 5.1; hgb 9.7; hct 31.2; mcv 106.7;  plt 129; glucose 217; bun 27.6; creat 1.37; k+ 3.8; na++135 01-06-15: hgb a1c 6.8; chol 178; ldl 110; trig 122; hdl 44 01-19-15: wbc 8.2; hgb 9.2; hct 31.4; mcv 90.2; plt 255; glucose 181; bun 24.4; creat 1.05; k+ 4.4; na++139 02-05-15: vit [id number]: [id number]; folic acid A999333; iron 64  02-11-15: wbc 5.3; hgb 10.2; hct 31.2; mcv 102.3; plt 145 glucose 136; bun 36; creat 1.59; k+ 4.0; na++136; liver normal albumin 3.9; lipase 20; sed rate 40  02-13-15: urine culture: gram neg rod  02-26-15: wbc 5.5 ;hgb 10.7; hct 32.1; mcv 102.9; plt 182; glucose 160; bun 23; creat 1.28; k+ 3.4; na++141; liver normal albumin 3.9; urine culture: 20,000 colonies e-coli 02-26-14: wbc 4.8; hgb 9.9; hct 30.2; mcv 103.4; plt 167; glucose 136; bun 20; creat 1.32; k+ 3.1; na++140; liver normal albumin 3.4 phos 3.6; mag 2.0; tsh 5.235  03-09-15: wbc 5.8; hgb 10.2; hct 30.3; mcv 103.1; plt 221; glucose 137; bun 14; creat 1.07; k+ 3.2; na++139; liver normal albumin 3.7; lipase 24; urine culture: e-coli: cipro 03-12-15: wbc 4.5 hgb 7.6; hct 23.0; mcv 101.3; plt 168; glucose 138; bun 40; creat 1.24; k+ 3.0; na++141; liver normal albumin 2.8 03-15-15: wbc 5.4; hgb 8.1; hct 25.1; mcv 105.5; plt 171; glucose 144; bun 13; creat 0.96; k+ 3.2; na++141  03-18-15: wbc 5.3; hgb 7.7; hct 24.9; mcv 105; plt 188; glucose 149; bun 8.1; creat 1.12; k+ 3.6; na++143  04-30-15: H-pylori: neg  10-14-15: wbc 5.0; hgb 11.5; hct 33.9; mcv 101.2; plt 127; glucose 132; bun 15.8; creat 1.27; k+ 3.8;  na++ 143; liver normal albumin 4.0      Review of Systems  Constitutional: Negative for appetite change and fatigue.  HENT: Negative for congestion.   Respiratory: Negative for cough, chest tightness and shortness of breath.   Cardiovascular: Negative for chest pain, palpitations and leg swelling.  Gastrointestinal: Negative for nausea, abdominal pain, diarrhea and constipation.  Musculoskeletal: no complaint of muscle or joint pain    Skin: Negative for pallor.    Neurological: Negative for dizziness.  Psychiatric/Behavioral: The patient is not nervous/anxious.       Physical Exam  Constitutional: No distress.  Eyes: Conjunctivae are normal.  Neck: Neck supple. No JVD present. No thyromegaly present.  Cardiovascular: Normal rate, regular rhythm and intact distal pulses.   Respiratory: Effort normal and breath sounds normal. No respiratory distress. She has no wheezes.  Is 02 dependent  GI: Soft. Bowel sounds are normal. She exhibits no distension. No tenderness Musculoskeletal: She exhibits no edema.  Able to move all extremities .   Lymphadenopathy:    She has no cervical adenopathy.  Neurological: She is alert.  Skin: Skin is warm and dry. She is not diaphoretic.  Psychiatric: She has a normal mood and affect.     ASSESSMENT/ PLAN:  1. Hypertension: will continue norvasc 2.5 mg daily coreg 3.15 mg daily   2. Chronic diastolic heart failure: will continue lasix 40 mg daily with k+ 20 meq daily   3. Duodenal ulcer: will continue protonix 40 mg  daily    4. UTI: will continue cranberry daily    5. Anemia: will continue iron daily hgb is 11.5  6. Chronic afib: will continue coreg 3.125 mg daily for rate control. Will continue asa 81mg  daily .  She is no longer on plavix    7. Chronic respiratory failure: is 02 dependent.   8. Allergic rhinitis: will continue flonase nightly and claritin 10 mg daily uses saline nasal spray four times daily   9. Chronic pain due to osteoarthritis: will continue lidoderm to lower back daily; will continue baclofen 5 mg nightly for leg spasticity and will monitor   10. Depression: is stable receives benefit form lexapro 10 mg daily   11. Chronic constipation: will continue colace 200 mg daily linzess 145 mcg daily; senna twice daily miralax daily   12. Glaucoma: will continue  trusopt to both eyes three times daily; travatan to both eyes nightly   13. Low back pain with radiculopathy: will  continue neurontin 100 mg nightly for her bilateral foot burning/pain     Ok Edwards NP [facility]  Contact [phone] Monday through Friday 8am- 5pm  After hours call [phone]

## 2015-11-21 ENCOUNTER — Encounter: Payer: Self-pay | Admitting: Internal Medicine

## 2015-12-02 DIAGNOSIS — I110 Hypertensive heart disease with heart failure: Secondary | ICD-10-CM | POA: Insufficient documentation

## 2015-12-14 DIAGNOSIS — M19012 Primary osteoarthritis, left shoulder: Secondary | ICD-10-CM | POA: Diagnosis not present

## 2015-12-19 DIAGNOSIS — K567 Ileus, unspecified: Secondary | ICD-10-CM | POA: Diagnosis not present

## 2015-12-19 DIAGNOSIS — R1031 Right lower quadrant pain: Secondary | ICD-10-CM | POA: Diagnosis not present

## 2015-12-20 ENCOUNTER — Inpatient Hospital Stay (HOSPITAL_COMMUNITY): Payer: Medicare Other | Admitting: Anesthesiology

## 2015-12-20 ENCOUNTER — Inpatient Hospital Stay (HOSPITAL_COMMUNITY)
Admission: EM | Admit: 2015-12-20 | Discharge: 2015-12-23 | DRG: 345 | Disposition: A | Discharge status: Skilled Nursing Facility | Payer: Medicare Other | Attending: Internal Medicine | Admitting: Internal Medicine

## 2015-12-20 ENCOUNTER — Emergency Department (HOSPITAL_COMMUNITY): Payer: Medicare Other

## 2015-12-20 ENCOUNTER — Encounter (HOSPITAL_COMMUNITY): Payer: Self-pay | Admitting: Emergency Medicine

## 2015-12-20 ENCOUNTER — Encounter (HOSPITAL_COMMUNITY)
Admission: EM | Disposition: A | Discharge status: Skilled Nursing Facility | Payer: Self-pay | Source: Home / Self Care | Attending: Internal Medicine

## 2015-12-20 DIAGNOSIS — R197 Diarrhea, unspecified: Secondary | ICD-10-CM | POA: Diagnosis not present

## 2015-12-20 DIAGNOSIS — Z8673 Personal history of transient ischemic attack (TIA), and cerebral infarction without residual deficits: Secondary | ICD-10-CM | POA: Diagnosis not present

## 2015-12-20 DIAGNOSIS — G20 Parkinson's disease: Secondary | ICD-10-CM | POA: Diagnosis present

## 2015-12-20 DIAGNOSIS — I25110 Atherosclerotic heart disease of native coronary artery with unstable angina pectoris: Secondary | ICD-10-CM | POA: Diagnosis not present

## 2015-12-20 DIAGNOSIS — K562 Volvulus: Principal | ICD-10-CM | POA: Diagnosis present

## 2015-12-20 DIAGNOSIS — I5041 Acute combined systolic (congestive) and diastolic (congestive) heart failure: Secondary | ICD-10-CM | POA: Diagnosis not present

## 2015-12-20 DIAGNOSIS — I129 Hypertensive chronic kidney disease with stage 1 through stage 4 chronic kidney disease, or unspecified chronic kidney disease: Secondary | ICD-10-CM | POA: Diagnosis not present

## 2015-12-20 DIAGNOSIS — I5032 Chronic diastolic (congestive) heart failure: Secondary | ICD-10-CM | POA: Diagnosis present

## 2015-12-20 DIAGNOSIS — Z87891 Personal history of nicotine dependence: Secondary | ICD-10-CM | POA: Diagnosis not present

## 2015-12-20 DIAGNOSIS — I058 Other rheumatic mitral valve diseases: Secondary | ICD-10-CM | POA: Diagnosis present

## 2015-12-20 DIAGNOSIS — K598 Other specified functional intestinal disorders: Secondary | ICD-10-CM | POA: Diagnosis not present

## 2015-12-20 DIAGNOSIS — R109 Unspecified abdominal pain: Secondary | ICD-10-CM

## 2015-12-20 DIAGNOSIS — I130 Hypertensive heart and chronic kidney disease with heart failure and stage 1 through stage 4 chronic kidney disease, or unspecified chronic kidney disease: Secondary | ICD-10-CM | POA: Diagnosis present

## 2015-12-20 DIAGNOSIS — M179 Osteoarthritis of knee, unspecified: Secondary | ICD-10-CM | POA: Diagnosis not present

## 2015-12-20 DIAGNOSIS — J9611 Chronic respiratory failure with hypoxia: Secondary | ICD-10-CM | POA: Diagnosis not present

## 2015-12-20 DIAGNOSIS — Z87440 Personal history of urinary (tract) infections: Secondary | ICD-10-CM

## 2015-12-20 DIAGNOSIS — J449 Chronic obstructive pulmonary disease, unspecified: Secondary | ICD-10-CM | POA: Diagnosis not present

## 2015-12-20 DIAGNOSIS — D599 Acquired hemolytic anemia, unspecified: Secondary | ICD-10-CM | POA: Diagnosis not present

## 2015-12-20 DIAGNOSIS — N390 Urinary tract infection, site not specified: Secondary | ICD-10-CM | POA: Diagnosis present

## 2015-12-20 DIAGNOSIS — Z9981 Dependence on supplemental oxygen: Secondary | ICD-10-CM | POA: Diagnosis not present

## 2015-12-20 DIAGNOSIS — Z7982 Long term (current) use of aspirin: Secondary | ICD-10-CM

## 2015-12-20 DIAGNOSIS — E1142 Type 2 diabetes mellitus with diabetic polyneuropathy: Secondary | ICD-10-CM | POA: Diagnosis not present

## 2015-12-20 DIAGNOSIS — N189 Chronic kidney disease, unspecified: Secondary | ICD-10-CM | POA: Diagnosis not present

## 2015-12-20 DIAGNOSIS — I429 Cardiomyopathy, unspecified: Secondary | ICD-10-CM | POA: Diagnosis present

## 2015-12-20 DIAGNOSIS — R933 Abnormal findings on diagnostic imaging of other parts of digestive tract: Secondary | ICD-10-CM | POA: Diagnosis not present

## 2015-12-20 DIAGNOSIS — B9620 Unspecified Escherichia coli [E. coli] as the cause of diseases classified elsewhere: Secondary | ICD-10-CM | POA: Diagnosis present

## 2015-12-20 DIAGNOSIS — I482 Chronic atrial fibrillation: Secondary | ICD-10-CM | POA: Diagnosis present

## 2015-12-20 DIAGNOSIS — Z993 Dependence on wheelchair: Secondary | ICD-10-CM

## 2015-12-20 DIAGNOSIS — I272 Other secondary pulmonary hypertension: Secondary | ICD-10-CM | POA: Diagnosis present

## 2015-12-20 DIAGNOSIS — E876 Hypokalemia: Secondary | ICD-10-CM | POA: Diagnosis not present

## 2015-12-20 DIAGNOSIS — F411 Generalized anxiety disorder: Secondary | ICD-10-CM | POA: Diagnosis not present

## 2015-12-20 DIAGNOSIS — R1084 Generalized abdominal pain: Secondary | ICD-10-CM | POA: Diagnosis not present

## 2015-12-20 DIAGNOSIS — K264 Chronic or unspecified duodenal ulcer with hemorrhage: Secondary | ICD-10-CM | POA: Diagnosis not present

## 2015-12-20 DIAGNOSIS — K219 Gastro-esophageal reflux disease without esophagitis: Secondary | ICD-10-CM | POA: Diagnosis present

## 2015-12-20 DIAGNOSIS — I10 Essential (primary) hypertension: Secondary | ICD-10-CM | POA: Diagnosis not present

## 2015-12-20 DIAGNOSIS — Z66 Do not resuscitate: Secondary | ICD-10-CM | POA: Diagnosis present

## 2015-12-20 DIAGNOSIS — N179 Acute kidney failure, unspecified: Secondary | ICD-10-CM | POA: Diagnosis present

## 2015-12-20 DIAGNOSIS — I5043 Acute on chronic combined systolic (congestive) and diastolic (congestive) heart failure: Secondary | ICD-10-CM | POA: Diagnosis not present

## 2015-12-20 DIAGNOSIS — E1122 Type 2 diabetes mellitus with diabetic chronic kidney disease: Secondary | ICD-10-CM | POA: Diagnosis present

## 2015-12-20 DIAGNOSIS — E785 Hyperlipidemia, unspecified: Secondary | ICD-10-CM | POA: Diagnosis present

## 2015-12-20 DIAGNOSIS — E119 Type 2 diabetes mellitus without complications: Secondary | ICD-10-CM | POA: Diagnosis not present

## 2015-12-20 DIAGNOSIS — Z86718 Personal history of other venous thrombosis and embolism: Secondary | ICD-10-CM

## 2015-12-20 DIAGNOSIS — K567 Ileus, unspecified: Secondary | ICD-10-CM | POA: Diagnosis not present

## 2015-12-20 DIAGNOSIS — F0391 Unspecified dementia with behavioral disturbance: Secondary | ICD-10-CM | POA: Diagnosis not present

## 2015-12-20 DIAGNOSIS — J309 Allergic rhinitis, unspecified: Secondary | ICD-10-CM | POA: Diagnosis not present

## 2015-12-20 DIAGNOSIS — G4700 Insomnia, unspecified: Secondary | ICD-10-CM | POA: Diagnosis not present

## 2015-12-20 DIAGNOSIS — I2510 Atherosclerotic heart disease of native coronary artery without angina pectoris: Secondary | ICD-10-CM | POA: Diagnosis present

## 2015-12-20 DIAGNOSIS — Z8711 Personal history of peptic ulcer disease: Secondary | ICD-10-CM

## 2015-12-20 DIAGNOSIS — K260 Acute duodenal ulcer with hemorrhage: Secondary | ICD-10-CM | POA: Diagnosis not present

## 2015-12-20 DIAGNOSIS — M6281 Muscle weakness (generalized): Secondary | ICD-10-CM | POA: Diagnosis not present

## 2015-12-20 DIAGNOSIS — F0390 Unspecified dementia without behavioral disturbance: Secondary | ICD-10-CM | POA: Diagnosis present

## 2015-12-20 DIAGNOSIS — Z79899 Other long term (current) drug therapy: Secondary | ICD-10-CM

## 2015-12-20 DIAGNOSIS — E860 Dehydration: Secondary | ICD-10-CM | POA: Diagnosis not present

## 2015-12-20 DIAGNOSIS — L89152 Pressure ulcer of sacral region, stage 2: Secondary | ICD-10-CM | POA: Diagnosis not present

## 2015-12-20 DIAGNOSIS — N183 Chronic kidney disease, stage 3 (moderate): Secondary | ICD-10-CM | POA: Diagnosis not present

## 2015-12-20 DIAGNOSIS — F339 Major depressive disorder, recurrent, unspecified: Secondary | ICD-10-CM | POA: Diagnosis not present

## 2015-12-20 DIAGNOSIS — Z7984 Long term (current) use of oral hypoglycemic drugs: Secondary | ICD-10-CM | POA: Diagnosis not present

## 2015-12-20 DIAGNOSIS — Z86010 Personal history of colonic polyps: Secondary | ICD-10-CM

## 2015-12-20 DIAGNOSIS — Z9049 Acquired absence of other specified parts of digestive tract: Secondary | ICD-10-CM

## 2015-12-20 HISTORY — PX: FLEXIBLE SIGMOIDOSCOPY: SHX5431

## 2015-12-20 LAB — CBC
HCT: 32.4 % — ABNORMAL LOW (ref 36.0–46.0)
Hemoglobin: 11 g/dL — ABNORMAL LOW (ref 12.0–15.0)
MCH: 34.4 pg — AB (ref 26.0–34.0)
MCHC: 34 g/dL (ref 30.0–36.0)
MCV: 101.3 fL — ABNORMAL HIGH (ref 78.0–100.0)
PLATELETS: 107 10*3/uL — AB (ref 150–400)
RBC: 3.2 MIL/uL — ABNORMAL LOW (ref 3.87–5.11)
RDW: 13.8 % (ref 11.5–15.5)
WBC: 5.2 10*3/uL (ref 4.0–10.5)

## 2015-12-20 LAB — COMPREHENSIVE METABOLIC PANEL
ALBUMIN: 3.9 g/dL (ref 3.5–5.0)
ALK PHOS: 95 U/L (ref 38–126)
ALT: 13 U/L — AB (ref 14–54)
ANION GAP: 6 (ref 5–15)
AST: 22 U/L (ref 15–41)
BILIRUBIN TOTAL: 0.8 mg/dL (ref 0.3–1.2)
BUN: 31 mg/dL — AB (ref 6–20)
CALCIUM: 8.9 mg/dL (ref 8.9–10.3)
CO2: 21 mmol/L — ABNORMAL LOW (ref 22–32)
CREATININE: 1.61 mg/dL — AB (ref 0.44–1.00)
Chloride: 111 mmol/L (ref 101–111)
GFR calc Af Amer: 32 mL/min — ABNORMAL LOW (ref >60)
GFR calc non Af Amer: 27 mL/min — ABNORMAL LOW (ref >60)
GLUCOSE: 138 mg/dL — AB (ref 65–99)
Potassium: 3.2 mmol/L — ABNORMAL LOW (ref 3.5–5.1)
Sodium: 138 mmol/L (ref 135–145)
TOTAL PROTEIN: 7.1 g/dL (ref 6.5–8.1)

## 2015-12-20 LAB — I-STAT CG4 LACTIC ACID, ED
LACTIC ACID, VENOUS: 0.85 mmol/L (ref 0.5–1.9)
Lactic Acid, Venous: 0.65 mmol/L (ref 0.5–1.9)

## 2015-12-20 LAB — URINALYSIS, ROUTINE W REFLEX MICROSCOPIC
BILIRUBIN URINE: NEGATIVE
Glucose, UA: NEGATIVE mg/dL
Ketones, ur: NEGATIVE mg/dL
Nitrite: POSITIVE — AB
PH: 5.5 (ref 5.0–8.0)
Protein, ur: 30 mg/dL — AB
SPECIFIC GRAVITY, URINE: 1.013 (ref 1.005–1.030)

## 2015-12-20 LAB — URINE MICROSCOPIC-ADD ON

## 2015-12-20 LAB — MRSA PCR SCREENING: MRSA BY PCR: NEGATIVE

## 2015-12-20 LAB — LIPASE, BLOOD: Lipase: 30 U/L (ref 11–51)

## 2015-12-20 SURGERY — SIGMOIDOSCOPY, FLEXIBLE
Anesthesia: Monitor Anesthesia Care

## 2015-12-20 MED ORDER — ONDANSETRON HCL 4 MG/2ML IJ SOLN
4.0000 mg | Freq: Once | INTRAMUSCULAR | Status: AC
  Administered 2015-12-20: 4 mg via INTRAVENOUS
  Filled 2015-12-20: qty 2

## 2015-12-20 MED ORDER — MORPHINE SULFATE (PF) 4 MG/ML IV SOLN: 4.0000 mg | Freq: Once | INTRAVENOUS | Status: DC

## 2015-12-20 MED ORDER — POTASSIUM CHLORIDE IN NACL 20-0.9 MEQ/L-% IV SOLN
INTRAVENOUS | Status: DC
  Administered 2015-12-20: 10:00:00 via INTRAVENOUS
  Filled 2015-12-20 (×3): qty 1000

## 2015-12-20 MED ORDER — ONDANSETRON HCL 4 MG/2ML IJ SOLN: 4.0000 mg | Freq: Once | INTRAMUSCULAR | Status: DC | PRN

## 2015-12-20 MED ORDER — FLEET ENEMA 7-19 GM/118ML RE ENEM
2.0000 | ENEMA | Freq: Once | RECTAL | Status: AC
  Administered 2015-12-20: 2 via RECTAL
  Filled 2015-12-20: qty 2

## 2015-12-20 MED ORDER — ENOXAPARIN SODIUM 30 MG/0.3ML ~~LOC~~ SOLN
30.0000 mg | SUBCUTANEOUS | Status: DC
  Administered 2015-12-20: 30 mg via SUBCUTANEOUS
  Filled 2015-12-20: qty 0.3

## 2015-12-20 MED ORDER — MEPERIDINE HCL 25 MG/ML IJ SOLN: 6.2500 mg | INTRAMUSCULAR | Status: DC | PRN

## 2015-12-20 MED ORDER — SODIUM CHLORIDE 0.9% FLUSH
3.0000 mL | Freq: Two times a day (BID) | INTRAVENOUS | Status: DC
  Administered 2015-12-20 – 2015-12-22 (×4): 3 mL via INTRAVENOUS

## 2015-12-20 MED ORDER — LATANOPROST 0.005 % OP SOLN
1.0000 [drp] | Freq: Every day | OPHTHALMIC | Status: DC
  Administered 2015-12-20 – 2015-12-22 (×3): 1 [drp] via OPHTHALMIC
  Filled 2015-12-20: qty 2.5

## 2015-12-20 MED ORDER — DORZOLAMIDE HCL 2 % OP SOLN
1.0000 [drp] | Freq: Three times a day (TID) | OPHTHALMIC | Status: DC
  Administered 2015-12-20 – 2015-12-23 (×8): 1 [drp] via OPHTHALMIC
  Filled 2015-12-20: qty 10

## 2015-12-20 MED ORDER — LACTATED RINGERS IV SOLN
INTRAVENOUS | Status: DC | PRN
  Administered 2015-12-20: 14:00:00 via INTRAVENOUS

## 2015-12-20 MED ORDER — FLUTICASONE PROPIONATE 50 MCG/ACT NA SUSP
2.0000 | Freq: Every day | NASAL | Status: DC
  Administered 2015-12-22: 2 via NASAL
  Filled 2015-12-20: qty 16

## 2015-12-20 MED ORDER — PROPOFOL 500 MG/50ML IV EMUL
INTRAVENOUS | Status: DC | PRN
  Administered 2015-12-20: 140 ug/kg/min via INTRAVENOUS

## 2015-12-20 MED ORDER — SENNA 8.6 MG PO TABS
1.0000 | ORAL_TABLET | Freq: Two times a day (BID) | ORAL | Status: DC
  Administered 2015-12-21 – 2015-12-23 (×3): 8.6 mg via ORAL
  Filled 2015-12-20 (×4): qty 1

## 2015-12-20 MED ORDER — SALINE SPRAY 0.65 % NA SOLN: 1.0000 | NASAL | Status: DC

## 2015-12-20 MED ORDER — DOCUSATE SODIUM 100 MG PO CAPS
200.0000 mg | ORAL_CAPSULE | Freq: Every day | ORAL | Status: DC
  Administered 2015-12-21 – 2015-12-22 (×2): 200 mg via ORAL
  Filled 2015-12-20 (×2): qty 2

## 2015-12-20 MED ORDER — ACETAMINOPHEN 325 MG PO TABS
650.0000 mg | ORAL_TABLET | Freq: Four times a day (QID) | ORAL | Status: DC | PRN
  Administered 2015-12-20 – 2015-12-22 (×3): 650 mg via ORAL
  Filled 2015-12-20 (×3): qty 2

## 2015-12-20 MED ORDER — LIDOCAINE HCL (CARDIAC) 20 MG/ML IV SOLN
INTRAVENOUS | Status: AC
  Filled 2015-12-20: qty 5

## 2015-12-20 MED ORDER — PROPOFOL 10 MG/ML IV BOLUS
INTRAVENOUS | Status: DC | PRN
  Administered 2015-12-20: 20 mg via INTRAVENOUS

## 2015-12-20 MED ORDER — DIATRIZOATE MEGLUMINE & SODIUM 66-10 % PO SOLN
30.0000 mL | Freq: Once | ORAL | Status: AC
  Administered 2015-12-20: 30 mL via ORAL

## 2015-12-20 MED ORDER — SODIUM CHLORIDE 0.9 % IV BOLUS (SEPSIS)
500.0000 mL | Freq: Once | INTRAVENOUS | Status: AC
  Administered 2015-12-20: 500 mL via INTRAVENOUS

## 2015-12-20 MED ORDER — VANCOMYCIN 50 MG/ML ORAL SOLUTION
250.0000 mg | Freq: Four times a day (QID) | ORAL | Status: DC
  Administered 2015-12-20 – 2015-12-22 (×8): 250 mg via ORAL
  Filled 2015-12-20 (×12): qty 5

## 2015-12-20 MED ORDER — GABAPENTIN 100 MG PO CAPS
100.0000 mg | ORAL_CAPSULE | Freq: Every day | ORAL | Status: DC
  Administered 2015-12-20 – 2015-12-22 (×3): 100 mg via ORAL
  Filled 2015-12-20 (×3): qty 1

## 2015-12-20 MED ORDER — POLYETHYLENE GLYCOL 3350 17 G PO PACK
17.0000 g | PACK | Freq: Every day | ORAL | Status: DC
  Administered 2015-12-21 – 2015-12-23 (×3): 17 g via ORAL
  Filled 2015-12-20 (×4): qty 1

## 2015-12-20 MED ORDER — ESCITALOPRAM OXALATE 10 MG PO TABS
10.0000 mg | ORAL_TABLET | Freq: Every day | ORAL | Status: DC
  Administered 2015-12-21 – 2015-12-23 (×3): 10 mg via ORAL
  Filled 2015-12-20 (×3): qty 1

## 2015-12-20 MED ORDER — FLEET ENEMA 7-19 GM/118ML RE ENEM
1.0000 | ENEMA | Freq: Once | RECTAL | Status: AC
  Administered 2015-12-20: 1 via RECTAL
  Filled 2015-12-20: qty 1

## 2015-12-20 MED ORDER — PANTOPRAZOLE SODIUM 40 MG PO PACK
40.0000 mg | PACK | Freq: Every day | ORAL | Status: DC
  Administered 2015-12-21 – 2015-12-23 (×3): 40 mg via ORAL
  Filled 2015-12-20 (×7): qty 20

## 2015-12-20 MED ORDER — BIOTIN 5 MG/ML PO LIQD: 5.0000 mL | Freq: Two times a day (BID) | ORAL | Status: DC

## 2015-12-20 MED ORDER — LINACLOTIDE 145 MCG PO CAPS
145.0000 ug | ORAL_CAPSULE | Freq: Every day | ORAL | Status: DC
  Administered 2015-12-21 – 2015-12-23 (×3): 145 ug via ORAL
  Filled 2015-12-20 (×4): qty 1

## 2015-12-20 MED ORDER — LORATADINE 10 MG PO TABS
10.0000 mg | ORAL_TABLET | Freq: Every day | ORAL | Status: DC
  Administered 2015-12-21 – 2015-12-23 (×3): 10 mg via ORAL
  Filled 2015-12-20 (×3): qty 1

## 2015-12-20 MED ORDER — BACLOFEN 10 MG PO TABS
5.0000 mg | ORAL_TABLET | Freq: Every day | ORAL | Status: DC
  Administered 2015-12-20 – 2015-12-22 (×3): 5 mg via ORAL
  Filled 2015-12-20 (×3): qty 1

## 2015-12-20 MED ORDER — CLOTRIMAZOLE 1 % EX CREA
1.0000 "application " | TOPICAL_CREAM | Freq: Two times a day (BID) | CUTANEOUS | Status: DC
  Administered 2015-12-20 – 2015-12-23 (×7): 1 via TOPICAL
  Filled 2015-12-20: qty 15

## 2015-12-20 MED ORDER — CARVEDILOL 3.125 MG PO TABS
3.1250 mg | ORAL_TABLET | Freq: Every day | ORAL | Status: DC
  Administered 2015-12-21 – 2015-12-23 (×2): 3.125 mg via ORAL
  Filled 2015-12-20 (×2): qty 1

## 2015-12-20 MED ORDER — LIDOCAINE 2% (20 MG/ML) 5 ML SYRINGE
INTRAMUSCULAR | Status: DC | PRN
  Administered 2015-12-20: 50 mg via INTRAVENOUS

## 2015-12-20 MED ORDER — LIDOCAINE 5 % EX PTCH
1.0000 | MEDICATED_PATCH | Freq: Every day | CUTANEOUS | Status: DC
Start: 2015-12-20 — End: 2015-12-23
  Administered 2015-12-20 – 2015-12-23 (×4): 1 via TRANSDERMAL
  Filled 2015-12-20 (×4): qty 1

## 2015-12-20 MED ORDER — HYDROMORPHONE HCL 1 MG/ML IJ SOLN: 0.2500 mg | INTRAMUSCULAR | Status: DC | PRN

## 2015-12-20 MED ORDER — PROPOFOL 10 MG/ML IV BOLUS
INTRAVENOUS | Status: AC
  Filled 2015-12-20: qty 40

## 2015-12-20 NOTE — Anesthesia Postprocedure Evaluation (Signed)
Anesthesia Post Note  Patient: KAWANDA BURLINGHAM  Procedure(s) Performed: Procedure(s) (LRB): FLEXIBLE SIGMOIDOSCOPY (N/A)  Patient location during evaluation: PACU Anesthesia Type: MAC Level of consciousness: awake and alert Pain management: pain level controlled Vital Signs Assessment: post-procedure vital signs reviewed and stable Respiratory status: spontaneous breathing, nonlabored ventilation, respiratory function stable and patient connected to nasal cannula oxygen Cardiovascular status: stable and blood pressure returned to baseline Anesthetic complications: no    Last Vitals:  Filed Vitals:   12/20/15 1415 12/20/15 1420  BP: 125/51 131/48  Pulse: 56 46  Temp:    Resp: 18 11    Last Pain:  Filed Vitals:   12/20/15 1425  PainSc: 0-No pain                 Trueman Worlds DAVID

## 2015-12-20 NOTE — ED Provider Notes (Signed)
CSN: [id number]     Arrival date & time 12/20/15  0020 History  By signing my name below, I, Altamease Oiler, attest that this documentation has been prepared under the direction and in the presence of Merryl Hacker, MD. Electronically Signed: Altamease Oiler, ED Scribe. 12/20/2015. 1:35 AM   Chief Complaint  Patient presents with  . Abdominal Pain   The history is provided by the patient. No language interpreter was used.   Maria Christian is a 82 y.o. female with PMHX of dementia who presents to the Emergency Department complaining of intermittent, 8/10 in severity, sharp/stabbing, abdominal pain and distension with onset 3-4 days ago. Pt states that she has pain right before a bowel movement. She has no pain at present. Pt denies constipation,  diarrhea, nausea, and vomiting.   Past Medical History  Diagnosis Date  . Atrial fibrillation ([place])   . Altered mental status   . Encephalopathy   . Parkinson disease ([place])   . Hypertension   . GERD (gastroesophageal reflux disease)   . Hyperlipemia   . Diabetes mellitus   . Coronary artery disease   . History of adenomatous polyp of colon 06/24/99  . Enlarged heart   . DEMENTIA   . Edema of lower extremity 07/13/11    right leg more swollen than left leg  . Esophageal dysmotility 07/02/12  . Horseshoe kidney   . Pancreatic lesion 05/22/11    no further workup  per PCP/family due to age  . Anemia   . Peripheral neuropathy ([place])   . Depression   . Thrombophlebitis   . TIA (transient ischemic attack) 12/19/2012  . Sigmoid volvulus ([place]) 05/24/2013  . Constipation 02/27/2015   Past Surgical History  Procedure Laterality Date  . Shoulder surgery  2001    left clavicle excision and acromioplasty  . Abdominal hysterectomy    . Cholecystectomy    . Total hip arthroplasty    . Breast surgery      2 benign tumors removed left breast  . Foot surgery      benign tumors from foot  . Esophageal dilation      several times by Dr. [name]  . Nose surgery    . Esophagogastroduodenoscopy (egd) with esophageal dilation N/A 08/01/2012    Procedure: ESOPHAGOGASTRODUODENOSCOPY (EGD) WITH ESOPHAGEAL DILATION;  Surgeon: [name], MD;  Location: [facility];  Service: Endoscopy;  Laterality: N/A;  with c-arm savory dilators  . Flexible sigmoidoscopy N/A 05/24/2013    Procedure: FLEXIBLE SIGMOIDOSCOPY;  Surgeon: [name], MD;  Location: [facility];  Service: Endoscopy;  Laterality: N/A;  . Flexible sigmoidoscopy N/A 05/26/2013    Procedure:  flex with decompression of sigmoid volvulus;  Surgeon: [name], MD;  Location: [facility];  Service: Endoscopy;  Laterality: N/A;  . Esophagogastroduodenoscopy (egd) with propofol N/A 03/12/2015    Procedure: ESOPHAGOGASTRODUODENOSCOPY (EGD) WITH PROPOFOL;  Surgeon: [name], MD;  Location: [facility];  Service: Endoscopy;  Laterality: N/A;   Family History  Problem Relation Age of Onset  . Cancer    . Heart disease     Social History  Substance Use Topics  . Smoking status: Former Smoker    Quit date: 10/09/1964  . Smokeless tobacco: Never Used  . Alcohol Use: No   OB History    No data available     Review of Systems  Constitutional: Negative for fever.  Gastrointestinal: Positive for abdominal pain and abdominal distention. Negative for nausea,  vomiting, diarrhea and constipation.  All other systems reviewed and are negative.   Allergies  Aspirin  Home Medications   Prior to Admission medications   Medication Sig Start Date End Date Taking? Authorizing Provider  alum & mag hydroxide-simeth (MAALOX/MYLANTA) 200-200-20 MG/5ML suspension Take 15 mLs by mouth once.   Yes Historical Provider, MD  amLODipine (NORVASC) 2.5 MG tablet Take 2.5 mg by mouth every morning. Hold for BP < 100/60 or HR < 60   Yes Historical Provider, MD  aspirin EC 81 MG tablet Take 81 mg by mouth daily.   Yes Historical Provider, MD  baclofen (LIORESAL) 10 MG tablet Take  5 mg by mouth at bedtime.   Yes Historical Provider, MD  Biotin 5 MG/ML LIQD Take 5 mLs by mouth 2 (two) times daily.    Yes Historical Provider, MD  carvedilol (COREG) 3.125 MG tablet Take 3.125 mg by mouth daily.   Yes Historical Provider, MD  cholecalciferol (VITAMIN D) 1000 UNITS tablet Take 1,000 Units by mouth every morning.    Yes Historical Provider, MD  clotrimazole (LOTRIMIN) 1 % cream Apply 1 application topically 2 (two) times daily. Apply to buttocks , labia topically every shift for rash then apply to buttocks, labia in the evening for rash.   Yes Historical Provider, MD  Cranberry 475 MG CAPS Take 1 capsule by mouth 2 (two) times daily.    Yes Historical Provider, MD  docusate sodium (COLACE) 100 MG capsule Take 200 mg by mouth at bedtime.    Yes Historical Provider, MD  dorzolamide (TRUSOPT) 2 % ophthalmic solution Place 1 drop into both eyes 3 (three) times daily. Reported on 11/16/2015   Yes Historical Provider, MD  escitalopram (LEXAPRO) 10 MG tablet Take 1 tablet (10 mg total) by mouth daily. 12/03/14  Yes Gerlene Fee, NP  ferrous sulfate 325 (65 FE) MG tablet Take 325 mg by mouth daily with breakfast.   Yes Historical Provider, MD  fluticasone (FLONASE) 50 MCG/ACT nasal spray Place 2 sprays into both nostrils at bedtime.   Yes Historical Provider, MD  furosemide (LASIX) 40 MG tablet Take 1 tablet (40 mg total) by mouth daily. Patient taking differently: Take 40 mg by mouth daily with breakfast.  02/28/15  Yes Robbie Lis, MD  gabapentin (NEURONTIN) 100 MG capsule Take 100 mg by mouth at bedtime.   Yes Historical Provider, MD  lidocaine (LIDODERM) 5 % Place 1 patch onto the skin daily. Apply 1 patch to left lower back/hip every morning.  Remove & Discard patch within 12 hours or as directed by MD Patient taking differently: Place 1 patch onto the skin daily at 6 (six) AM. Apply 1 patch to left lower back/hip every morning.  Remove & Discard patch within 12 hours or as directed  by MD 07/28/13  Yes Thurnell Lose, MD  Linaclotide (LINZESS) 145 MCG CAPS capsule Take 145 mcg by mouth daily before breakfast.    Yes Historical Provider, MD  loratadine (CLARITIN) 10 MG tablet Take 10 mg by mouth daily before breakfast.    Yes Historical Provider, MD  Multiple Vitamins-Minerals (CERTAGEN PO) Take 1 tablet by mouth daily.    Yes Historical Provider, MD  Nutritional Supplements (TWOCAL HN) LIQD Take 90 mLs by mouth 3 (three) times daily.   Yes Historical Provider, MD  ondansetron (ZOFRAN) 8 MG tablet Take 8 mg by mouth every 8 (eight) hours as needed for nausea or vomiting.   Yes Historical Provider, MD  OXYGEN Inhale  2 L into the lungs continuous.   Yes Historical Provider, MD  polyethylene glycol (MIRALAX / GLYCOLAX) packet Take 17 g by mouth daily at 6 (six) AM.    Yes Historical Provider, MD  potassium chloride SA (K-DUR,KLOR-CON) 20 MEQ tablet Take 20 mEq by mouth daily with breakfast.  10/23/13  Yes Janece Canterbury, MD  PROTONIX 40 MG PACK Take 40 mg by mouth daily at 6 (six) AM. 12/06/15  Yes Historical Provider, MD  senna (SENOKOT) 8.6 MG TABS tablet Take 1 tablet (8.6 mg total) by mouth 2 (two) times daily. 01/05/14  Yes Barton Dubois, MD  Skin Protectants, Misc. (CALAZIME SKIN PROTECTANT EX) Apply 1 application topically as directed. Cleanse buttocks and apply paste daily and as needed every day and night shift for protection.   Yes Historical Provider, MD  sodium chloride (OCEAN) 0.65 % SOLN nasal spray Place 1 spray into both nostrils as directed. Four times daily And every 24 hours as needed to moisturize nasal passages.   Yes Historical Provider, MD  Travoprost, BAK Free, (TRAVATAN) 0.004 % SOLN ophthalmic solution Place 1 drop into both eyes daily at 8 pm.    Yes Historical Provider, MD  vitamin B-12 (CYANOCOBALAMIN) 500 MCG tablet Take 500 mcg by mouth every morning.    Yes Historical Provider, MD  acetaminophen (TYLENOL) 325 MG tablet Take 2 tablets (650 mg total) by  mouth every 6 (six) hours as needed for mild pain. Patient not taking: Reported on 12/20/2015 11/03/14   Gerlene Fee, NP   BP 130/53 mmHg  Pulse 89  Temp(Src) 98.3 F (36.8 C) (Oral)  Resp 17  SpO2 97% Physical Exam  Constitutional: She is oriented to person, place, and time. No distress.  Elderly, no acute distress  HENT:  Head: Normocephalic and atraumatic.  Cardiovascular: Normal rate, regular rhythm and normal heart sounds.   Pulmonary/Chest: Breath sounds normal. No respiratory distress. She has no wheezes.  Abdominal: Soft. She exhibits distension. There is tenderness. There is no rebound and no guarding.  Hyperactive bowel sounds, mild diffuse tenderness to palpation  Neurological: She is alert and oriented to person, place, and time.  Skin: Skin is warm and dry.  Psychiatric: She has a normal mood and affect.  Nursing note and vitals reviewed.   ED Course  Procedures (including critical care time) DIAGNOSTIC STUDIES: Oxygen Saturation is 97% on RA,  normal by my interpretation.    COORDINATION OF CARE: 1:34 AM Discussed treatment plan which includes lab work and abdominal XR with pt at bedside and pt agreed to plan.  Labs Reviewed  COMPREHENSIVE METABOLIC PANEL - Abnormal; Notable for the following:    Potassium 3.2 (*)    CO2 21 (*)    Glucose, Bld 138 (*)    BUN 31 (*)    Creatinine, Ser 1.61 (*)    ALT 13 (*)    GFR calc non Af Amer 27 (*)    GFR calc Af Amer 32 (*)    All other components within normal limits  CBC - Abnormal; Notable for the following:    RBC 3.20 (*)    Hemoglobin 11.0 (*)    HCT 32.4 (*)    MCV 101.3 (*)    MCH 34.4 (*)    Platelets 107 (*)    All other components within normal limits  LIPASE, BLOOD  URINALYSIS, ROUTINE W REFLEX MICROSCOPIC (NOT AT [facility])  I-STAT CG4 LACTIC ACID, ED  I-STAT CG4 LACTIC ACID, ED  Imaging Review Ct Abdomen Pelvis Wo Contrast  12/20/2015  CLINICAL DATA:  Abdominal pain EXAM: CT ABDOMEN AND  PELVIS WITHOUT CONTRAST TECHNIQUE: Multidetector CT imaging of the abdomen and pelvis was performed following the standard protocol without IV contrast. COMPARISON:  03/09/2015 FINDINGS: Lower chest and abdominal wall: Cardiomegaly. Extensive coronary and aortic atherosclerosis. Hepatobiliary: No focal liver abnormality.Cholecystectomy. Pancreas: Chronic cystic change in the pancreatic body without appreciable change compared to prior. This area continues to measure 24 mm in length. Continued stability suggests benign process. Spleen: Unremarkable. Adrenals/Urinary Tract: Negative adrenals.Horseshoe kidney with renal atrophy. No hydronephrosis or stone. Unremarkable bladder. Stomach/Bowel: Distended colon above the distal sigmoid which shows mesenteric and bowel twisting. The twist is relatively loose but still causes transition point. Fluid seen in the rectum. Colonic distention is improved compared to radiography earlier today with distal sigmoid measuring 9 cm in maximal diameter compared at 13 cm previously. Uncomplicated few diverticula. Reproductive:Hysterectomy. Vascular/Lymphatic: No acute vascular abnormality. Extensive atherosclerotic calcification. No mass or adenopathy. Other: Small left pelvic ascites, presumably reactive. Musculoskeletal: No acute finding.  Total left hip arthroplasty. IMPRESSION: 1. Loosely twisted sigmoid volvulus with transition point. Proximal distention is improved compared to radiography earlier today suggesting partial interval decompression. 2. Chronic findings are stable and described above. Electronically Signed   By: Monte Fantasia M.D.   On: 12/20/2015 06:37   Dg Abd Acute W/chest  12/20/2015  CLINICAL DATA:  Ileus. EXAM: DG ABDOMEN ACUTE W/ 1V CHEST COMPARISON:  [facility] March 11, 2015 and chest x-ray January 05, 2014 FINDINGS: Stable cardiomegaly. No pneumothorax. Mild increased interstitial opacities suggest mild edema. No free air, portal venous gas, or pneumatosis. Again  noted are dilated loops of colon without definitive gas in the rectum. Overall, colonic distention has mildly worsened in the interval measuring up to 12.6 cm today versus 11 cm previously. IMPRESSION: 1. Markedly distended loops of colon without definitive gas in the rectum. Given the similar pattern on previous studies and history, this likely represents chronic colonic ileus. It would be very difficult to exclude a volvulus on this study alone and if there is concern, a CT scan could better evaluate. Electronically Signed   By: Dorise Bullion III M.D   On: 12/20/2015 03:07   I have personally reviewed and evaluated these images and lab results as part of my medical decision-making.   EKG Interpretation None      MDM   Final diagnoses:  Abdominal pain    Patient presents with abdominal distention and abdominal pain. Nontoxic on exam. Tender without signs of peritonitis. She is very distended and has hyperactive bowel sounds. Lab work notable for mild AK. Mild low potassium. Lactate is normal. X-rays concerning for possible sigmoid volvulus. Recommend CT scan. CT with evidence of possibly improving sigmoid volvulus. Will consult GI. Patient is a [employer] [facility] patient.    Discuss with GI, Dr. Fuller Plan.  Will admit to medicine for further support. GI to see.  I personally performed the services described in this documentation, which was scribed in my presence. The recorded information has been reviewed and is accurate.    Merryl Hacker, MD 12/20/15 [id number]

## 2015-12-20 NOTE — H&P (Signed)
History and Physical  Maria Christian [id number] DOB: Dec 17, 1924 DOA: 12/20/2015  Referring physician: EDP PCP: Hollace Kinnier, DO   Chief Complaint: abdominal pain, distension, diarrhea  HPI: Maria Christian is a 82 y.o. female   From SNF baseline not able bed to wheelchair bound,  With h/o dementia, Parkinson's disease, HTN, diet controlled diabetes, chronic diastolic heart failure, atrial fibrillation not on anticoagulation secondary to risk of falls,  Chronic hypoxia on home o2 is sent to [facility] ED due to abdominal distension/pain.   ED course: her vital is stable on arrival, CT ab/ple reviewed sigmoid volvulus with transition point. Labs wbc 5.2, hgb 11, k3.2 , cr 1.6, ua with many bacteria, large leukocytes, + nitrate, she is given analagesics, ivf, EDP called GI who plan to do decompression flex sig, hospitalist called to admit the patient.  Patient is not able to provide detailed history, but denies chest pain, sob, no cough, no fever, no lower extremity edema, she reported abdominal pain, diarrhea since last Thursday.   Review of Systems:  Detail per HPI, Review of systems are otherwise negative  Past Medical History  Diagnosis Date  . Atrial fibrillation ([place])   . Altered mental status   . Encephalopathy   . Parkinson disease ([place])   . Hypertension   . GERD (gastroesophageal reflux disease)   . Hyperlipemia   . Diabetes mellitus   . Coronary artery disease   . History of adenomatous polyp of colon 06/24/99  . Enlarged heart   . DEMENTIA   . Edema of lower extremity 07/13/11    right leg more swollen than left leg  . Esophageal dysmotility 07/02/12  . Horseshoe kidney   . Pancreatic lesion 05/22/11    no further workup  per PCP/family due to age  . Anemia   . Peripheral neuropathy ([place])   . Depression   . Thrombophlebitis   . TIA (transient ischemic attack) 12/19/2012  . Sigmoid volvulus ([place]) 05/24/2013  . Constipation 02/27/2015   Past Surgical History  Procedure  Laterality Date  . Shoulder surgery  2001    left clavicle excision and acromioplasty  . Abdominal hysterectomy    . Cholecystectomy    . Total hip arthroplasty    . Breast surgery      2 benign tumors removed left breast  . Foot surgery      benign tumors from foot  . Esophageal dilation      several times by Dr. [name]  . Nose surgery    . Esophagogastroduodenoscopy (egd) with esophageal dilation N/A 08/01/2012    Procedure: ESOPHAGOGASTRODUODENOSCOPY (EGD) WITH ESOPHAGEAL DILATION;  Surgeon: [name], MD;  Location: [facility] ENDOSCOPY;  Service: Endoscopy;  Laterality: N/A;  with c-arm savory dilators  . Flexible sigmoidoscopy N/A 05/24/2013    Procedure: FLEXIBLE SIGMOIDOSCOPY;  Surgeon: [name], MD;  Location: [facility] ENDOSCOPY;  Service: Endoscopy;  Laterality: N/A;  . Flexible sigmoidoscopy N/A 05/26/2013    Procedure:  flex with decompression of sigmoid volvulus;  Surgeon: [name], MD;  Location: [facility] ENDOSCOPY;  Service: Endoscopy;  Laterality: N/A;  . Esophagogastroduodenoscopy (egd) with propofol N/A 03/12/2015    Procedure: ESOPHAGOGASTRODUODENOSCOPY (EGD) WITH PROPOFOL;  Surgeon: [name], MD;  Location: [facility] ENDOSCOPY;  Service: Endoscopy;  Laterality: N/A;   Social History:  reports that she quit smoking about 51 years ago. She has never used smokeless tobacco. She reports that she does not drink alcohol or use illicit drugs. Patient lives at [facility] &  is bed to wheelchair bound  Allergies  Allergen Reactions  . Aspirin Other (See Comments)    G.I. Upset only    Family History  Problem Relation Age of Onset  . Cancer    . Heart disease        Prior to Admission medications   Medication Sig Start Date End Date Taking? Authorizing Provider  alum & mag hydroxide-simeth (MAALOX/MYLANTA) 200-200-20 MG/5ML suspension Take 15 mLs by mouth once.   Yes Historical Provider, MD  amLODipine (NORVASC) 2.5 MG tablet Take 2.5 mg by mouth every morning. Hold for BP <  100/60 or HR < 60   Yes Historical Provider, MD  aspirin EC 81 MG tablet Take 81 mg by mouth daily.   Yes Historical Provider, MD  baclofen (LIORESAL) 10 MG tablet Take 5 mg by mouth at bedtime.   Yes Historical Provider, MD  Biotin 5 MG/ML LIQD Take 5 mLs by mouth 2 (two) times daily.    Yes Historical Provider, MD  carvedilol (COREG) 3.125 MG tablet Take 3.125 mg by mouth daily.   Yes Historical Provider, MD  cholecalciferol (VITAMIN D) 1000 UNITS tablet Take 1,000 Units by mouth every morning.    Yes Historical Provider, MD  clotrimazole (LOTRIMIN) 1 % cream Apply 1 application topically 2 (two) times daily. Apply to buttocks , labia topically every shift for rash then apply to buttocks, labia in the evening for rash.   Yes Historical Provider, MD  Cranberry 475 MG CAPS Take 1 capsule by mouth 2 (two) times daily.    Yes Historical Provider, MD  docusate sodium (COLACE) 100 MG capsule Take 200 mg by mouth at bedtime.    Yes Historical Provider, MD  dorzolamide (TRUSOPT) 2 % ophthalmic solution Place 1 drop into both eyes 3 (three) times daily. Reported on 11/16/2015   Yes Historical Provider, MD  escitalopram (LEXAPRO) 10 MG tablet Take 1 tablet (10 mg total) by mouth daily. 12/03/14  Yes Gerlene Fee, NP  ferrous sulfate 325 (65 FE) MG tablet Take 325 mg by mouth daily with breakfast.   Yes Historical Provider, MD  fluticasone (FLONASE) 50 MCG/ACT nasal spray Place 2 sprays into both nostrils at bedtime.   Yes Historical Provider, MD  furosemide (LASIX) 40 MG tablet Take 1 tablet (40 mg total) by mouth daily. Patient taking differently: Take 40 mg by mouth daily with breakfast.  02/28/15  Yes Robbie Lis, MD  gabapentin (NEURONTIN) 100 MG capsule Take 100 mg by mouth at bedtime.   Yes Historical Provider, MD  lidocaine (LIDODERM) 5 % Place 1 patch onto the skin daily. Apply 1 patch to left lower back/hip every morning.  Remove & Discard patch within 12 hours or as directed by MD Patient taking  differently: Place 1 patch onto the skin daily at 6 (six) AM. Apply 1 patch to left lower back/hip every morning.  Remove & Discard patch within 12 hours or as directed by MD 07/28/13  Yes Thurnell Lose, MD  Linaclotide (LINZESS) 145 MCG CAPS capsule Take 145 mcg by mouth daily before breakfast.    Yes Historical Provider, MD  loratadine (CLARITIN) 10 MG tablet Take 10 mg by mouth daily before breakfast.    Yes Historical Provider, MD  Multiple Vitamins-Minerals (CERTAGEN PO) Take 1 tablet by mouth daily.    Yes Historical Provider, MD  Nutritional Supplements (TWOCAL HN) LIQD Take 90 mLs by mouth 3 (three) times daily.   Yes Historical Provider, MD  ondansetron [facility])  8 MG tablet Take 8 mg by mouth every 8 (eight) hours as needed for nausea or vomiting.   Yes Historical Provider, MD  OXYGEN Inhale 2 L into the lungs continuous.   Yes Historical Provider, MD  polyethylene glycol (MIRALAX / GLYCOLAX) packet Take 17 g by mouth daily at 6 (six) AM.    Yes Historical Provider, MD  potassium chloride SA (K-DUR,KLOR-CON) 20 MEQ tablet Take 20 mEq by mouth daily with breakfast.  10/23/13  Yes Janece Canterbury, MD  PROTONIX 40 MG PACK Take 40 mg by mouth daily at 6 (six) AM. 12/06/15  Yes Historical Provider, MD  senna (SENOKOT) 8.6 MG TABS tablet Take 1 tablet (8.6 mg total) by mouth 2 (two) times daily. 01/05/14  Yes Barton Dubois, MD  Skin Protectants, Misc. (CALAZIME SKIN PROTECTANT EX) Apply 1 application topically as directed. Cleanse buttocks and apply paste daily and as needed every day and night shift for protection.   Yes Historical Provider, MD  sodium chloride (OCEAN) 0.65 % SOLN nasal spray Place 1 spray into both nostrils as directed. Four times daily And every 24 hours as needed to moisturize nasal passages.   Yes Historical Provider, MD  Travoprost, BAK Free, (TRAVATAN) 0.004 % SOLN ophthalmic solution Place 1 drop into both eyes daily at 8 pm.    Yes Historical Provider, MD  vitamin B-12  (CYANOCOBALAMIN) 500 MCG tablet Take 500 mcg by mouth every morning.    Yes Historical Provider, MD  acetaminophen (TYLENOL) 325 MG tablet Take 2 tablets (650 mg total) by mouth every 6 (six) hours as needed for mild pain. Patient not taking: Reported on 12/20/2015 11/03/14   Gerlene Fee, NP    Physical Exam: BP 133/52 mmHg  Pulse 66  Temp(Src) 98.3 F (36.8 C) (Oral)  Resp 13  SpO2 100%  General:  Frail elderly female, chronically ill appearing, NAD Eyes: PERRL ENT: dry oral musosa Neck: supple, no JVD Cardiovascular: RRR Respiratory: CTABL Abdomen: distended, +bowel sounds, mild diffuse tender,  Skin: no rash Musculoskeletal:  No edema Psychiatric: calm/cooperative Neurologic: no focal findings , baseline poor memory, but oriented to person, know she is in the hospital , know it is 2017.           Labs on Admission:  Basic Metabolic Panel:  Recent Labs Lab 12/20/15 0130  NA 138  K 3.2*  CL 111  CO2 21*  GLUCOSE 138*  BUN 31*  CREATININE 1.61*  CALCIUM 8.9   Liver Function Tests:  Recent Labs Lab 12/20/15 0130  AST 22  ALT 13*  ALKPHOS 95  BILITOT 0.8  PROT 7.1  ALBUMIN 3.9    Recent Labs Lab 12/20/15 0130  LIPASE 30   No results for input(s): AMMONIA in the last 168 hours. CBC:  Recent Labs Lab 12/20/15 0130  WBC 5.2  HGB 11.0*  HCT 32.4*  MCV 101.3*  PLT 107*   Cardiac Enzymes: No results for input(s): CKTOTAL, CKMB, CKMBINDEX, TROPONINI in the last 168 hours.  BNP (last 3 results) No results for input(s): BNP in the last 8760 hours.  ProBNP (last 3 results) No results for input(s): PROBNP in the last 8760 hours.  CBG: No results for input(s): GLUCAP in the last 168 hours.  Radiological Exams on Admission: Ct Abdomen Pelvis Wo Contrast  12/20/2015  CLINICAL DATA:  Abdominal pain EXAM: CT ABDOMEN AND PELVIS WITHOUT CONTRAST TECHNIQUE: Multidetector CT imaging of the abdomen and pelvis was performed following the standard  protocol without IV  contrast. COMPARISON:  03/09/2015 FINDINGS: Lower chest and abdominal wall: Cardiomegaly. Extensive coronary and aortic atherosclerosis. Hepatobiliary: No focal liver abnormality.Cholecystectomy. Pancreas: Chronic cystic change in the pancreatic body without appreciable change compared to prior. This area continues to measure 24 mm in length. Continued stability suggests benign process. Spleen: Unremarkable. Adrenals/Urinary Tract: Negative adrenals.Horseshoe kidney with renal atrophy. No hydronephrosis or stone. Unremarkable bladder. Stomach/Bowel: Distended colon above the distal sigmoid which shows mesenteric and bowel twisting. The twist is relatively loose but still causes transition point. Fluid seen in the rectum. Colonic distention is improved compared to radiography earlier today with distal sigmoid measuring 9 cm in maximal diameter compared at 13 cm previously. Uncomplicated few diverticula. Reproductive:Hysterectomy. Vascular/Lymphatic: No acute vascular abnormality. Extensive atherosclerotic calcification. No mass or adenopathy. Other: Small left pelvic ascites, presumably reactive. Musculoskeletal: No acute finding.  Total left hip arthroplasty. IMPRESSION: 1. Loosely twisted sigmoid volvulus with transition point. Proximal distention is improved compared to radiography earlier today suggesting partial interval decompression. 2. Chronic findings are stable and described above. Electronically Signed   By: Monte Fantasia M.D.   On: 12/20/2015 06:37   Dg Abd Acute W/chest  12/20/2015  CLINICAL DATA:  Ileus. EXAM: DG ABDOMEN ACUTE W/ 1V CHEST COMPARISON:  [facility] March 11, 2015 and chest x-ray January 05, 2014 FINDINGS: Stable cardiomegaly. No pneumothorax. Mild increased interstitial opacities suggest mild edema. No free air, portal venous gas, or pneumatosis. Again noted are dilated loops of colon without definitive gas in the rectum. Overall, colonic distention has mildly worsened in  the interval measuring up to 12.6 cm today versus 11 cm previously. IMPRESSION: 1. Markedly distended loops of colon without definitive gas in the rectum. Given the similar pattern on previous studies and history, this likely represents chronic colonic ileus. It would be very difficult to exclude a volvulus on this study alone and if there is concern, a CT scan could better evaluate. Electronically Signed   By: Dorise Bullion III M.D   On: 12/20/2015 03:07   Assessment/Plan Present on Admission:  . Sigmoid volvulus ([place])  Sigmoid volvulus: currently npo, to GI endo suite for flex sig, appreciate gi /general surgery input.  Diarrhea: will check c diff, empirically start on oral vanc, due to h/o c diff and stool with strong odor suspect c diff.  Hypokalemia: replace k, check mag  AKI; cr 1.6, ua? UTI? No fever, no leukocytosis, due to concern for c diff will hold of abx, urine culture pending, hold lasix, gentle hydration  H/o diastolic chf, currently dry, hold lasix, on gentle hydration  Chronic hypoxic respiratory failure, continue home o2, currently no in respiratory distress.  Diet controlled diabetes, continue diet control, monitor blood sugar  HTN//afib: continue coreg, hold lasix, norvasc   Frailty with dementia, parkinson's , SNF resident, baseline bed to wheelchair bound, report enjoys to play bingo.     DVT prophylaxis: scd's  Consultants: GI/General surgery  Code Status: DNR, verified with patient's son  [name] who is her health care power of attorney   Family Communication:  Patient , her daughter in room , her son [name] over the phone  Disposition Plan: admit to med tele  Time spent: 16mins  Khristin Keleher MD, PhD [facility] Pager [phone] If 7PM-7AM, please contact night-coverage at [email or web address], password [facility]

## 2015-12-20 NOTE — Anesthesia Preprocedure Evaluation (Signed)
Anesthesia Evaluation  Patient identified by MRN, date of birth, ID band Patient awake    Reviewed: Allergy & Precautions, NPO status , Patient's Chart, lab work & pertinent test results  Airway Mallampati: I  TM Distance: >3 FB Neck ROM: Full    Dental   Pulmonary former smoker,    Pulmonary exam normal        Cardiovascular hypertension, Pt. on medications + CAD  Normal cardiovascular exam     Neuro/Psych Depression    GI/Hepatic PUD, GERD  Medicated and Controlled,  Endo/Other  diabetes, Type 2, Oral Hypoglycemic Agents  Renal/GU Renal disease     Musculoskeletal   Abdominal   Peds  Hematology  (+) anemia ,   Anesthesia Other Findings   Reproductive/Obstetrics                             Anesthesia Physical Anesthesia Plan  ASA: III  Anesthesia Plan: MAC   Post-op Pain Management:    Induction: Intravenous  Airway Management Planned: Natural Airway  Additional Equipment:   Intra-op Plan:   Post-operative Plan:   Informed Consent: I have reviewed the patients History and Physical, chart, labs and discussed the procedure including the risks, benefits and alternatives for the proposed anesthesia with the patient or authorized representative who has indicated his/her understanding and acceptance.     Plan Discussed with: CRNA and Surgeon  Anesthesia Plan Comments:         Anesthesia Quick Evaluation

## 2015-12-20 NOTE — Consult Note (Signed)
Reason for Consult:   reecurrent volvulus Referring Physician: Dr. Owens Loffler  Maria Christian is an 82 y.o. female.  HPI:  Patient is a long  time resident of [facility] transferred to the emergency department abdominal distention. She has a long history of dementia and presents with pain right before bowel movements.   Reported abdominal distention for 3-4 days.   Workup in the emergency department shows potassium of 3.2,creatinine of 1.61.  A normal white count, H/H1/32, platelets are low at 107,000,probable UTI. Culture is pending.   Plain films in the emergency room revealed markedly distended loops of colon consistent with an ileus pattern CT scan shows loosely twisted sigmoid vovulus with a transition point. Proximal distention is improved compared to earlier film consistent with a partial decompression.    She was seen earlier this a.m. By GI and Dr Ardis Hughs. She has a sigmoid volvulus which is recurrent she had one in  2014 with 2 decompression, we saw her in  07/2013, with thickening but she had no volvolus at that time.  Because of her longstanding deconditioning, and multiple medical issues she was not deemed a candidate at that time.  She has been essentially bed ridden since her hip fracture, but I cannot see a date nor can her daughter remember. She has chronic back pain on the right and cannot lie flat. It is  Dr. Ardis Hughs opinion she has chronic twisting her sigmoid colon that periodicall twist enough to obstruct. She is had a flexible sigmoidoscopy at this time today with resolution. Her abdomen is flat and she is only complaining of her back now. She was seen in 2015 and because of her chronic debilitation was not considered a candidate for surgery at that time.  She continues to be confined to bed and a wheel chair, with ongoing SNF care.  We are ask to see and consider her for possible surgical resection of her colon for recurrent sigmoid volvulus.     Past  Medical History  Diagnosis Date  . Atrial fibrillation ([place])   . Altered mental status   . Encephalopathy   . Parkinson disease ([place])   . Hypertension   . GERD (gastroesophageal reflux disease)   . Hyperlipemia   . Diabetes mellitus   . Coronary artery disease   . History of adenomatous polyp of colon 06/24/99  . Enlarged heart   . DEMENTIA   . Edema of lower extremity 07/13/11    right leg more swollen than left leg  . Esophageal dysmotility 07/02/12  . Horseshoe kidney   . Pancreatic lesion 05/22/11    no further workup  per PCP/family due to age  . Anemia   . Peripheral neuropathy ([place])   . Depression   . Thrombophlebitis   . TIA (transient ischemic attack) 12/19/2012  . Sigmoid volvulus ([place]) 05/24/2013  . Constipation 02/27/2015    Past Surgical History  Procedure Laterality Date  . Shoulder surgery  2001    left clavicle excision and acromioplasty  . Abdominal hysterectomy    . Cholecystectomy    . Total hip arthroplasty    . Breast surgery      2 benign tumors removed left breast  . Foot surgery      benign tumors from foot  . Esophageal dilation      several times by Dr. [name]  . Nose surgery    . Esophagogastroduodenoscopy (egd) with esophageal dilation N/A 08/01/2012    Procedure: ESOPHAGOGASTRODUODENOSCOPY (  EGD) WITH ESOPHAGEAL DILATION;  Surgeon: [name], MD;  Location: [facility];  Service: Endoscopy;  Laterality: N/A;  with c-arm savory dilators  . Flexible sigmoidoscopy N/A 05/24/2013    Procedure: FLEXIBLE SIGMOIDOSCOPY;  Surgeon: [name], MD;  Location: [facility];  Service: Endoscopy;  Laterality: N/A;  . Flexible sigmoidoscopy N/A 05/26/2013    Procedure:  flex with decompression of sigmoid volvulus;  Surgeon: [name], MD;  Location: [facility];  Service: Endoscopy;  Laterality: N/A;  . Esophagogastroduodenoscopy (egd) with propofol N/A 03/12/2015    Procedure: ESOPHAGOGASTRODUODENOSCOPY (EGD) WITH PROPOFOL;  Surgeon: [name], MD;  Location: [facility];  Service: Endoscopy;  Laterality: N/A;    Family History  Problem Relation Age of Onset  . Cancer    . Heart disease      Social History:  reports that she quit smoking about 51 years ago. She has never used smokeless tobacco. She reports that she does not drink alcohol or use illicit drugs.  Allergies:  Allergies  Allergen Reactions  . Aspirin Other (See Comments)    G.I. Upset only    Medications:  Prior to Admission:  Prescriptions prior to admission  Medication Sig Dispense Refill Last Dose  . alum & mag hydroxide-simeth (MAALOX/MYLANTA) 200-200-20 MG/5ML suspension Take 15 mLs by mouth once.   12/19/2015 at 2021  . amLODipine (NORVASC) 2.5 MG tablet Take 2.5 mg by mouth every morning. Hold for BP < 100/60 or HR < 60   12/19/2015 at Unknown time  . aspirin EC 81 MG tablet Take 81 mg by mouth daily.   12/19/2015 at Unknown time  . baclofen (LIORESAL) 10 MG tablet Take 5 mg by mouth at bedtime.   12/19/2015 at Unknown time  . Biotin 5 MG/ML LIQD Take 5 mLs by mouth 2 (two) times daily.    12/19/2015 at Unknown time  . carvedilol (COREG) 3.125 MG tablet Take 3.125 mg by mouth daily.   12/19/2015 at 0800  . cholecalciferol (VITAMIN D) 1000 UNITS tablet Take 1,000 Units by mouth every morning.    12/19/2015 at Unknown time  . clotrimazole (LOTRIMIN) 1 % cream Apply 1 application topically 2 (two) times daily. Apply to buttocks , labia topically every shift for rash then apply to buttocks, labia in the evening for rash.   12/19/2015 at Unknown time  . Cranberry 475 MG CAPS Take 1 capsule by mouth 2 (two) times daily.    12/19/2015 at Unknown time  . docusate sodium (COLACE) 100 MG capsule Take 200 mg by mouth at bedtime.    12/19/2015 at Unknown time  . dorzolamide (TRUSOPT) 2 % ophthalmic solution Place 1 drop into both eyes 3 (three) times daily. Reported on 11/16/2015   12/19/2015 at Unknown time  . escitalopram (LEXAPRO) 10 MG tablet Take 1 tablet (10 mg  total) by mouth daily. 90 tablet 11 12/19/2015 at Unknown time  . ferrous sulfate 325 (65 FE) MG tablet Take 325 mg by mouth daily with breakfast.   12/19/2015 at Unknown time  . fluticasone (FLONASE) 50 MCG/ACT nasal spray Place 2 sprays into both nostrils at bedtime.   12/19/2015 at Unknown time  . furosemide (LASIX) 40 MG tablet Take 1 tablet (40 mg total) by mouth daily. (Patient taking differently: Take 40 mg by mouth daily with breakfast. ) 30 tablet  12/19/2015 at Unknown time  . gabapentin (NEURONTIN) 100 MG capsule Take 100 mg by mouth at bedtime.   12/19/2015 at Unknown time  .  lidocaine (LIDODERM) 5 % Place 1 patch onto the skin daily. Apply 1 patch to left lower back/hip every morning.  Remove & Discard patch within 12 hours or as directed by MD (Patient taking differently: Place 1 patch onto the skin daily at 6 (six) AM. Apply 1 patch to left lower back/hip every morning.  Remove & Discard patch within 12 hours or as directed by MD) 5 patch 0 12/19/2015 at Unknown time  . Linaclotide (LINZESS) 145 MCG CAPS capsule Take 145 mcg by mouth daily before breakfast.    12/19/2015 at Unknown time  . loratadine (CLARITIN) 10 MG tablet Take 10 mg by mouth daily before breakfast.    12/19/2015 at 0600  . Multiple Vitamins-Minerals (CERTAGEN PO) Take 1 tablet by mouth daily.    12/19/2015 at Unknown time  . Nutritional Supplements (TWOCAL HN) LIQD Take 90 mLs by mouth 3 (three) times daily.   12/19/2015 at 1600  . ondansetron (ZOFRAN) 8 MG tablet Take 8 mg by mouth every 8 (eight) hours as needed for nausea or vomiting.   unknown  . OXYGEN Inhale 2 L into the lungs continuous.   Taking  . polyethylene glycol (MIRALAX / GLYCOLAX) packet Take 17 g by mouth daily at 6 (six) AM.    12/19/2015 at Unknown time  . potassium chloride SA (K-DUR,KLOR-CON) 20 MEQ tablet Take 20 mEq by mouth daily with breakfast.    12/19/2015 at 0800  . PROTONIX 40 MG PACK Take 40 mg by mouth daily at 6 (six) AM.   12/19/2015 at Unknown time   . senna (SENOKOT) 8.6 MG TABS tablet Take 1 tablet (8.6 mg total) by mouth 2 (two) times daily. 120 each 0 12/19/2015 at Unknown time  . Skin Protectants, Misc. (CALAZIME SKIN PROTECTANT EX) Apply 1 application topically as directed. Cleanse buttocks and apply paste daily and as needed every day and night shift for protection.   12/19/2015 at Unknown time  . sodium chloride (OCEAN) 0.65 % SOLN nasal spray Place 1 spray into both nostrils as directed. Four times daily And every 24 hours as needed to moisturize nasal passages.   unknown  . Travoprost, BAK Free, (TRAVATAN) 0.004 % SOLN ophthalmic solution Place 1 drop into both eyes daily at 8 pm.    12/19/2015 at Unknown time  . vitamin B-12 (CYANOCOBALAMIN) 500 MCG tablet Take 500 mcg by mouth every morning.    12/19/2015 at Unknown time  . acetaminophen (TYLENOL) 325 MG tablet Take 2 tablets (650 mg total) by mouth every 6 (six) hours as needed for mild pain. (Patient not taking: Reported on 12/20/2015) 120 tablet 11 Taking   Scheduled: . [MAR Hold] baclofen  5 mg Oral QHS  . [MAR Hold] carvedilol  3.125 mg Oral Daily  . [MAR Hold] clotrimazole  1 application Topical BID  . [MAR Hold] docusate sodium  200 mg Oral QHS  . [MAR Hold] dorzolamide  1 drop Both Eyes TID  . [MAR Hold] escitalopram  10 mg Oral Daily  . [MAR Hold] fluticasone  2 spray Each Nare QHS  . [MAR Hold] gabapentin  100 mg Oral QHS  . [MAR Hold] latanoprost  1 drop Both Eyes QHS  . [MAR Hold] lidocaine  1 patch Transdermal Q0600  . [MAR Hold] linaclotide  145 mcg Oral QAC breakfast  . [MAR Hold] loratadine  10 mg Oral QAC breakfast  . [MAR Hold] pantoprazole sodium  40 mg Oral Q0600  . [MAR Hold] polyethylene glycol  17 g Oral  Q4  . [MAR Hold] senna  1 tablet Oral BID  . [MAR Hold] sodium chloride  1 spray Each Nare UD  . [MAR Hold] vancomycin  250 mg Oral Q6H   Continuous: . 0.9 % NaCl with KCl 20 mEq / L 75 mL/hr at 12/20/15 0958   PRN: Anti-infectives    Start      Dose/Rate Route Frequency Ordered Stop   12/20/15 1400  [MAR Hold]  vancomycin (VANCOCIN) 50 mg/mL oral solution 250 mg     (MAR Hold since 12/20/15 1335)   250 mg Oral Every 6 hours 12/20/15 1316        Results for orders placed or performed during the hospital encounter of 12/20/15 (from the past 48 hour(s))  Lipase, blood     Status: None   Collection Time: 12/20/15  1:30 AM  Result Value Ref Range   Lipase 30 11 - 51 U/L  Comprehensive metabolic panel     Status: Abnormal   Collection Time: 12/20/15  1:30 AM  Result Value Ref Range   Sodium 138 135 - 145 mmol/L   Potassium 3.2 (L) 3.5 - 5.1 mmol/L   Chloride 111 101 - 111 mmol/L   CO2 21 (L) 22 - 32 mmol/L   Glucose, Bld 138 (H) 65 - 99 mg/dL   BUN 31 (H) 6 - 20 mg/dL   Creatinine, Ser 1.61 (H) 0.44 - 1.00 mg/dL   Calcium 8.9 8.9 - 10.3 mg/dL   Total Protein 7.1 6.5 - 8.1 g/dL   Albumin 3.9 3.5 - 5.0 g/dL   AST 22 15 - 41 U/L   ALT 13 (L) 14 - 54 U/L   Alkaline Phosphatase 95 38 - 126 U/L   Total Bilirubin 0.8 0.3 - 1.2 mg/dL   GFR calc non Af Amer 27 (L) >60 mL/min   GFR calc Af Amer 32 (L) >60 mL/min    Comment: (NOTE) The eGFR has been calculated using the CKD EPI equation. This calculation has not been validated in all clinical situations. eGFR's persistently <60 mL/min signify possible Chronic Kidney Disease.    Anion gap 6 5 - 15  CBC     Status: Abnormal   Collection Time: 12/20/15  1:30 AM  Result Value Ref Range   WBC 5.2 4.0 - 10.5 K/uL   RBC 3.20 (L) 3.87 - 5.11 MIL/uL   Hemoglobin 11.0 (L) 12.0 - 15.0 g/dL   HCT 32.4 (L) 36.0 - 46.0 %   MCV 101.3 (H) 78.0 - 100.0 fL   MCH 34.4 (H) 26.0 - 34.0 pg   MCHC 34.0 30.0 - 36.0 g/dL   RDW 13.8 11.5 - 15.5 %   Platelets 107 (L) 150 - 400 K/uL    Comment: SPECIMEN CHECKED FOR CLOTS REPEATED TO VERIFY PLATELET COUNT CONFIRMED BY SMEAR   I-Stat CG4 Lactic Acid, ED     Status: None   Collection Time: 12/20/15  1:55 AM  Result Value Ref Range   Lactic Acid,  Venous 0.85 0.5 - 1.9 mmol/L  Urinalysis, Routine w reflex microscopic     Status: Abnormal   Collection Time: 12/20/15  5:55 AM  Result Value Ref Range   Color, Urine YELLOW YELLOW   APPearance TURBID (A) CLEAR   Specific Gravity, Urine 1.013 1.005 - 1.030   pH 5.5 5.0 - 8.0   Glucose, UA NEGATIVE NEGATIVE mg/dL   Hgb urine dipstick MODERATE (A) NEGATIVE   Bilirubin Urine NEGATIVE NEGATIVE   Ketones, ur NEGATIVE NEGATIVE  mg/dL   Protein, ur 30 (A) NEGATIVE mg/dL   Nitrite POSITIVE (A) NEGATIVE   Leukocytes, UA LARGE (A) NEGATIVE  Urine microscopic-add on     Status: Abnormal   Collection Time: 12/20/15  5:55 AM  Result Value Ref Range   Squamous Epithelial / LPF 0-5 (A) NONE SEEN   WBC, UA TOO NUMEROUS TO COUNT 0 - 5 WBC/hpf   RBC / HPF 6-30 0 - 5 RBC/hpf   Bacteria, UA MANY (A) NONE SEEN  I-Stat CG4 Lactic Acid, ED     Status: None   Collection Time: 12/20/15  5:56 AM  Result Value Ref Range   Lactic Acid, Venous 0.65 0.5 - 1.9 mmol/L    Ct Abdomen Pelvis Wo Contrast  12/20/2015  CLINICAL DATA:  Abdominal pain EXAM: CT ABDOMEN AND PELVIS WITHOUT CONTRAST TECHNIQUE: Multidetector CT imaging of the abdomen and pelvis was performed following the standard protocol without IV contrast. COMPARISON:  03/09/2015 FINDINGS: Lower chest and abdominal wall: Cardiomegaly. Extensive coronary and aortic atherosclerosis. Hepatobiliary: No focal liver abnormality.Cholecystectomy. Pancreas: Chronic cystic change in the pancreatic body without appreciable change compared to prior. This area continues to measure 24 mm in length. Continued stability suggests benign process. Spleen: Unremarkable. Adrenals/Urinary Tract: Negative adrenals.Horseshoe kidney with renal atrophy. No hydronephrosis or stone. Unremarkable bladder. Stomach/Bowel: Distended colon above the distal sigmoid which shows mesenteric and bowel twisting. The twist is relatively loose but still causes transition point. Fluid seen in the  rectum. Colonic distention is improved compared to radiography earlier today with distal sigmoid measuring 9 cm in maximal diameter compared at 13 cm previously. Uncomplicated few diverticula. Reproductive:Hysterectomy. Vascular/Lymphatic: No acute vascular abnormality. Extensive atherosclerotic calcification. No mass or adenopathy. Other: Small left pelvic ascites, presumably reactive. Musculoskeletal: No acute finding.  Total left hip arthroplasty. IMPRESSION: 1. Loosely twisted sigmoid volvulus with transition point. Proximal distention is improved compared to radiography earlier today suggesting partial interval decompression. 2. Chronic findings are stable and described above. Electronically Signed   By: Monte Fantasia M.D.   On: 12/20/2015 06:37   Dg Abd Acute W/chest  12/20/2015  CLINICAL DATA:  Ileus. EXAM: DG ABDOMEN ACUTE W/ 1V CHEST COMPARISON:  [facility] March 11, 2015 and chest x-ray January 05, 2014 FINDINGS: Stable cardiomegaly. No pneumothorax. Mild increased interstitial opacities suggest mild edema. No free air, portal venous gas, or pneumatosis. Again noted are dilated loops of colon without definitive gas in the rectum. Overall, colonic distention has mildly worsened in the interval measuring up to 12.6 cm today versus 11 cm previously. IMPRESSION: 1. Markedly distended loops of colon without definitive gas in the rectum. Given the similar pattern on previous studies and history, this likely represents chronic colonic ileus. It would be very difficult to exclude a volvulus on this study alone and if there is concern, a CT scan could better evaluate. Electronically Signed   By: Dorise Bullion III M.D   On: 12/20/2015 03:07    ROS Blood pressure 136/45, pulse 63, temperature 98.6 F (37 C), temperature source Oral, resp. rate 8, height 5' 7"  (1.702 m), weight 68.04 kg (150 lb), SpO2 98 %. Physical Exam  Assessment/Plan: Recurrent sigmoid volvulus Hx of constipation  Long term  Dementia/Parkinson'd disease Hx of TIA Bed ridden for some years Hx of atrial fibrillation  CAD/Cardiomyopathy  Pulmonary hypertension Aortic and mitral sclerosis, mild MR Chronic kidney disease Hx of GI bleed/duodenal ulcers Recurrent UTI AODM Hx of DVT, off chronic anticoagulation   Plan:  She has successful  resolution of the volvulus with decompression.  She was seen and evaluated by Dr. Excell Seltzer.  With successful decompression and with her multiple medical conditions he would again recommend ongoing medical management.  With her multiple medical issues she would have a very difficult time recovering from a colon resection.   Julie-Ann Vanmaanen 12/20/2015, 1:52 PM

## 2015-12-20 NOTE — Transfer of Care (Signed)
Immediate Anesthesia Transfer of Care Note  Patient: Maria Christian  Procedure(s) Performed: Procedure(s): FLEXIBLE SIGMOIDOSCOPY (N/A)  Patient Location: Endoscopy Unit  Anesthesia Type:MAC  Level of Consciousness: sedated  Airway & Oxygen Therapy: Patient Spontanous Breathing and Patient connected to face mask oxygen  Post-op Assessment: Report given to RN and Post -op Vital signs reviewed and stable  Post vital signs: Reviewed and stable  Last Vitals:  Filed Vitals:   12/20/15 0936 12/20/15 1339  BP: 134/65 136/45  Pulse: 57 63  Temp: 37 C 37 C  Resp: 20 8    Last Pain:  Filed Vitals:   12/20/15 1343  PainSc: 0-No pain         Complications: No apparent anesthesia complications

## 2015-12-20 NOTE — Op Note (Addendum)
[facility] Patient Name: Maria Christian Procedure Date: 12/20/2015 MRN: [id number] Attending MD: Milus Banister , MD Date of Birth: 1924-10-25 CSN: [id number] Age: 82 Admit Type: Outpatient Procedure:                Flexible Sigmoidoscopy Indications:              Abnormal CT of the GI tract (abd pain, distension,                            Ct shows sigmoid volvulus "partial"; she's had                            sigmoid volvulus in 2014 and another event in 2015                            which was called colonic ileus) Providers:                Owens Loffler, MD, Alinda Deem, RN, Cherylynn Ridges, Technician, Danley Danker, CRNA Referring MD:              Medicines:                Monitored Anesthesia Care Complications:            No immediate complications. Estimated blood loss:                            None. Estimated Blood Loss:     Estimated blood loss: none. Procedure:                Pre-Anesthesia Assessment:                           - Prior to the procedure, a History and Physical                            was performed, and patient medications and                            allergies were reviewed. The patient's tolerance of                            previous anesthesia was also reviewed. The risks                            and benefits of the procedure and the sedation                            options and risks were discussed with the patient.                            All questions were answered, and informed consent  was obtained. Prior Anticoagulants: The patient has                            taken no previous anticoagulant or antiplatelet                            agents. ASA Grade Assessment: III - A patient with                            severe systemic disease. After reviewing the risks                            and benefits, the patient was deemed in   satisfactory condition to undergo the procedure.                           After obtaining informed consent, the scope was                            passed under direct vision. The [id number]                            scope was introduced through the anus and advanced                            to the the splenic flexure. The flexible                            sigmoidoscopy was accomplished without difficulty.                            The patient tolerated the procedure well. The                            quality of the bowel preparation was good. Scope In: 2:03:59 PM Scope Out: 2:05:35 PM Total Procedure Duration: 0 hours 1 minute 36 seconds  Findings:      There was a slight narrowing of the sigmoid lumen and proximal to this       the colon was very dilated and filled with air. The scope was advanced       to the splenic flexure and all the air was suctioned out. The are of       slight narrowing, mild twist was examined and the mucosa appeared       normal. The lumen through this site was about 1.5cm across, easily able       to advance the adult colonoscope past it. Impression:               Chronic intermittent sigmoid volvulus. The site of                            volvulus was only slightly twisted during this                            exam, easily passed with colonoscope and  the air                            filled, dilated colon proximal to it was suctioned                            extensively. Her abdomen was flat, normal after the                            examination. Moderate Sedation:      N/A- Per Anesthesia Care Recommendation:           - Return patient to hospital ward for ongoing care.                           - Advance diet as tolerated.                           - Await surgery input about sigmoid resection, pexy                            since this has proven to be a recurrent problem.                           - Bowel regimen with daily  miralax.                           - Avoid narcotic pain medicines.                           - Ambulate as much as tolerated.                           - Follow up with GI as needed. Procedure Code(s):        --- Professional ---                           [id number], Sigmoidoscopy, flexible; diagnostic,                            including collection of specimen(s) by brushing or                            washing, when performed (separate procedure) Diagnosis Code(s):        --- Professional ---                           K59.39, Other megacolon                           R93.3, Abnormal findings on diagnostic imaging of                            other parts of digestive tract CPT copyright 2016 [employer]. All rights reserved. The codes documented in this report are preliminary and upon coder review may  be revised  to meet current compliance requirements. Milus Banister, MD 12/20/2015 2:16:52 PM This report has been signed electronically. Number of Addenda: 0

## 2015-12-20 NOTE — ED Notes (Signed)
[place]

## 2015-12-20 NOTE — ED Notes (Signed)
Bed: WA09 Expected date:  Expected time:  Means of arrival:  Comments: EMS: [facility]

## 2015-12-20 NOTE — Consult Note (Signed)
Consultation  Referring Provider:  Dr. Dina Rich    Primary Care Physician:  Hollace Kinnier, DO Primary Gastroenterologist:  Dr. Fuller Plan       Reason for Consultation: Sigmoid volvulus              HPI:   Maria Christian is a 82 y.o. Caucasian female, with a past medical history significant for A. fib, hypertension, Parkinson's, GERD, hyperlipidemia, diabetes, coronary artery disease, diastolic CHF,sigmoid volvulus, peripheral neuropathy and depression who presented to the emergency department this morning from [facility] in [place], for a chief complaint of abdominal pain and distention. The patient does have a history of dementia, so history is somewhat limited.  At the time of my interview, the patient is able to express that last Thursday she started with some generalized abdominal pain, worse before a bowel movement, they tried "multiple medicines" and nothing helped. She explains at this time her abdomen has continued to "get bigger", and expresses the pain now comes intermittently like "labor pains". At its worst, the pain is rated as an 8/10 and is described as sharp and stabbing. She does tell me that she has continued to have bowel movements throughout this time, though they are very "loose". Patient vaguely describes an instance of the same symptoms "a long time ago". Associated symptoms include fatigue, the patient tells me that she could "sleep all day and night every day". Patient also describes intermittent nausea. Her last normal meal was last night and she was able to eat with no problem.  Patient denies fever, chills, heartburn, reflux, constipation, vomiting or anorexia.  Past GI history: 03/12/15-EGD, Dr. Deatra Ina: Impression: In the duodenal bulb there is a 1.5-2 cm ulcer with clot at the base. There was no active bleeding. Surrounding mucosa was edematous. EGD was otherwise normal; pathology: Benign ulcerative duodenitis 05/26/13-flex sig, Dr. Deatra Ina: Impression: Sigmoid  volvulus-status post reduction of volvulus and insertion of colon decompression tube 05/24/13-flexible sigmoidoscopy more endoscopy, Dr. Hilarie Fredrickson: Impression: Evidence of sigmoid volvulus with focal erythema without frank ischemia/necrosis, successful decompression, dilated sigmoid and descending colon, and decompression successful 08/01/12-EGD, Dr. Olevia Perches: Impression: Presbyesophagus with intermittent spasm, dilated  Past Medical History  Diagnosis Date  . Atrial fibrillation ([place])   . Altered mental status   . Encephalopathy   . Parkinson disease ([place])   . Hypertension   . GERD (gastroesophageal reflux disease)   . Hyperlipemia   . Diabetes mellitus   . Coronary artery disease   . History of adenomatous polyp of colon 06/24/99  . Enlarged heart   . DEMENTIA   . Edema of lower extremity 07/13/11    right leg more swollen than left leg  . Esophageal dysmotility 07/02/12  . Horseshoe kidney   . Pancreatic lesion 05/22/11    no further workup  per PCP/family due to age  . Anemia   . Peripheral neuropathy ([place])   . Depression   . Thrombophlebitis   . TIA (transient ischemic attack) 12/19/2012  . Sigmoid volvulus ([place]) 05/24/2013  . Constipation 02/27/2015    Past Surgical History  Procedure Laterality Date  . Shoulder surgery  2001    left clavicle excision and acromioplasty  . Abdominal hysterectomy    . Cholecystectomy    . Total hip arthroplasty    . Breast surgery      2 benign tumors removed left breast  . Foot surgery      benign tumors from foot  . Esophageal dilation  several times by Dr. [name]  . Nose surgery    . Esophagogastroduodenoscopy (egd) with esophageal dilation N/A 08/01/2012    Procedure: ESOPHAGOGASTRODUODENOSCOPY (EGD) WITH ESOPHAGEAL DILATION;  Surgeon: [name], MD;  Location: [facility] ENDOSCOPY;  Service: Endoscopy;  Laterality: N/A;  with c-arm savory dilators  . Flexible sigmoidoscopy N/A 05/24/2013    Procedure: FLEXIBLE SIGMOIDOSCOPY;   Surgeon: [name], MD;  Location: [facility]COPY;  Service: Endoscopy;  Laterality: N/A;  . Flexible sigmoidoscopy N/A 05/26/2013    Procedure:  flex with decompression of sigmoid volvulus;  Surgeon: [name], MD;  Location: [facility];  Service: Endoscopy;  Laterality: N/A;  . Esophagogastroduodenoscopy (egd) with propofol N/A 03/12/2015    Procedure: ESOPHAGOGASTRODUODENOSCOPY (EGD) WITH PROPOFOL;  Surgeon: [name], MD;  Location: [facility];  Service: Endoscopy;  Laterality: N/A;    Family History  Problem Relation Age of Onset  . Cancer    . Heart disease      Social History  Substance Use Topics  . Smoking status: Former Smoker    Quit date: 10/09/1964  . Smokeless tobacco: Never Used  . Alcohol Use: No    Prior to Admission medications   Medication Sig Start Date End Date Taking? Authorizing Provider  alum & mag hydroxide-simeth (MAALOX/MYLANTA) 200-200-20 MG/5ML suspension Take 15 mLs by mouth once.   Yes Historical Provider, MD  amLODipine (NORVASC) 2.5 MG tablet Take 2.5 mg by mouth every morning. Hold for BP < 100/60 or HR < 60   Yes Historical Provider, MD  aspirin EC 81 MG tablet Take 81 mg by mouth daily.   Yes Historical Provider, MD  baclofen (LIORESAL) 10 MG tablet Take 5 mg by mouth at bedtime.   Yes Historical Provider, MD  Biotin 5 MG/ML LIQD Take 5 mLs by mouth 2 (two) times daily.    Yes Historical Provider, MD  carvedilol (COREG) 3.125 MG tablet Take 3.125 mg by mouth daily.   Yes Historical Provider, MD  cholecalciferol (VITAMIN D) 1000 UNITS tablet Take 1,000 Units by mouth every morning.    Yes Historical Provider, MD  clotrimazole (LOTRIMIN) 1 % cream Apply 1 application topically 2 (two) times daily. Apply to buttocks , labia topically every shift for rash then apply to buttocks, labia in the evening for rash.   Yes Historical Provider, MD  Cranberry 475 MG CAPS Take 1 capsule by mouth 2 (two) times daily.    Yes Historical Provider, MD    docusate sodium (COLACE) 100 MG capsule Take 200 mg by mouth at bedtime.    Yes Historical Provider, MD  dorzolamide (TRUSOPT) 2 % ophthalmic solution Place 1 drop into both eyes 3 (three) times daily. Reported on 11/16/2015   Yes Historical Provider, MD  escitalopram (LEXAPRO) 10 MG tablet Take 1 tablet (10 mg total) by mouth daily. 12/03/14  Yes Gerlene Fee, NP  ferrous sulfate 325 (65 FE) MG tablet Take 325 mg by mouth daily with breakfast.   Yes Historical Provider, MD  fluticasone (FLONASE) 50 MCG/ACT nasal spray Place 2 sprays into both nostrils at bedtime.   Yes Historical Provider, MD  furosemide (LASIX) 40 MG tablet Take 1 tablet (40 mg total) by mouth daily. Patient taking differently: Take 40 mg by mouth daily with breakfast.  02/28/15  Yes Robbie Lis, MD  gabapentin (NEURONTIN) 100 MG capsule Take 100 mg by mouth at bedtime.   Yes Historical Provider, MD  lidocaine (LIDODERM) 5 % Place 1 patch onto  the skin daily. Apply 1 patch to left lower back/hip every morning.  Remove & Discard patch within 12 hours or as directed by MD Patient taking differently: Place 1 patch onto the skin daily at 6 (six) AM. Apply 1 patch to left lower back/hip every morning.  Remove & Discard patch within 12 hours or as directed by MD 07/28/13  Yes Thurnell Lose, MD  Linaclotide (LINZESS) 145 MCG CAPS capsule Take 145 mcg by mouth daily before breakfast.    Yes Historical Provider, MD  loratadine (CLARITIN) 10 MG tablet Take 10 mg by mouth daily before breakfast.    Yes Historical Provider, MD  Multiple Vitamins-Minerals (CERTAGEN PO) Take 1 tablet by mouth daily.    Yes Historical Provider, MD  Nutritional Supplements (TWOCAL HN) LIQD Take 90 mLs by mouth 3 (three) times daily.   Yes Historical Provider, MD  ondansetron (ZOFRAN) 8 MG tablet Take 8 mg by mouth every 8 (eight) hours as needed for nausea or vomiting.   Yes Historical Provider, MD  OXYGEN Inhale 2 L into the lungs continuous.   Yes  Historical Provider, MD  polyethylene glycol (MIRALAX / GLYCOLAX) packet Take 17 g by mouth daily at 6 (six) AM.    Yes Historical Provider, MD  potassium chloride SA (K-DUR,KLOR-CON) 20 MEQ tablet Take 20 mEq by mouth daily with breakfast.  10/23/13  Yes Janece Canterbury, MD  PROTONIX 40 MG PACK Take 40 mg by mouth daily at 6 (six) AM. 12/06/15  Yes Historical Provider, MD  senna (SENOKOT) 8.6 MG TABS tablet Take 1 tablet (8.6 mg total) by mouth 2 (two) times daily. 01/05/14  Yes Barton Dubois, MD  Skin Protectants, Misc. (CALAZIME SKIN PROTECTANT EX) Apply 1 application topically as directed. Cleanse buttocks and apply paste daily and as needed every day and night shift for protection.   Yes Historical Provider, MD  sodium chloride (OCEAN) 0.65 % SOLN nasal spray Place 1 spray into both nostrils as directed. Four times daily And every 24 hours as needed to moisturize nasal passages.   Yes Historical Provider, MD  Travoprost, BAK Free, (TRAVATAN) 0.004 % SOLN ophthalmic solution Place 1 drop into both eyes daily at 8 pm.    Yes Historical Provider, MD  vitamin B-12 (CYANOCOBALAMIN) 500 MCG tablet Take 500 mcg by mouth every morning.    Yes Historical Provider, MD  acetaminophen (TYLENOL) 325 MG tablet Take 2 tablets (650 mg total) by mouth every 6 (six) hours as needed for mild pain. Patient not taking: Reported on 12/20/2015 11/03/14   Gerlene Fee, NP    Current Facility-Administered Medications  Medication Dose Route Frequency Provider Last Rate Last Dose  . 0.9 % NaCl with KCl 20 mEq/ L  infusion   Intravenous Continuous Florencia Reasons, MD      . baclofen (LIORESAL) tablet 5 mg  5 mg Oral QHS Florencia Reasons, MD      . clotrimazole (LOTRIMIN) 1 % cream 1 application  1 application Topical BID Florencia Reasons, MD      . docusate sodium (COLACE) capsule 200 mg  200 mg Oral QHS Florencia Reasons, MD      . dorzolamide (TRUSOPT) 2 % ophthalmic solution 1 drop  1 drop Both Eyes TID Florencia Reasons, MD      . escitalopram (LEXAPRO) tablet 10  mg  10 mg Oral Daily Florencia Reasons, MD      . fluticasone (FLONASE) 50 MCG/ACT nasal spray 2 spray  2 spray Each Nare QHS  Florencia Reasons, MD      . gabapentin (NEURONTIN) capsule 100 mg  100 mg Oral QHS Florencia Reasons, MD      . latanoprost (XALATAN) 0.005 % ophthalmic solution 1 drop  1 drop Both Eyes QHS Florencia Reasons, MD      . lidocaine (LIDODERM) 5 % 1 patch  1 patch Transdermal Q0600 Florencia Reasons, MD      . linaclotide Rolan Lipa) capsule 145 mcg  145 mcg Oral QAC breakfast Florencia Reasons, MD      . loratadine (CLARITIN) tablet 10 mg  10 mg Oral QAC breakfast Florencia Reasons, MD      . pantoprazole sodium (PROTONIX) 40 mg/20 mL oral suspension 40 mg  40 mg Oral Q0600 Florencia Reasons, MD      . polyethylene glycol (MIRALAX / GLYCOLAX) packet 17 g  17 g Oral Q0600 Florencia Reasons, MD      . senna [facility]) tablet 8.6 mg  1 tablet Oral BID Florencia Reasons, MD      . sodium chloride (OCEAN) 0.65 % nasal spray 1 spray  1 spray Each Nare UD Florencia Reasons, MD       Current Outpatient Prescriptions  Medication Sig Dispense Refill  . alum & mag hydroxide-simeth (MAALOX/MYLANTA) 200-200-20 MG/5ML suspension Take 15 mLs by mouth once.    Marland Kitchen amLODipine (NORVASC) 2.5 MG tablet Take 2.5 mg by mouth every morning. Hold for BP < 100/60 or HR < 60    . aspirin EC 81 MG tablet Take 81 mg by mouth daily.    . baclofen (LIORESAL) 10 MG tablet Take 5 mg by mouth at bedtime.    . Biotin 5 MG/ML LIQD Take 5 mLs by mouth 2 (two) times daily.     . carvedilol (COREG) 3.125 MG tablet Take 3.125 mg by mouth daily.    . cholecalciferol (VITAMIN D) 1000 UNITS tablet Take 1,000 Units by mouth every morning.     . clotrimazole (LOTRIMIN) 1 % cream Apply 1 application topically 2 (two) times daily. Apply to buttocks , labia topically every shift for rash then apply to buttocks, labia in the evening for rash.    . Cranberry 475 MG CAPS Take 1 capsule by mouth 2 (two) times daily.     Marland Kitchen docusate sodium (COLACE) 100 MG capsule Take 200 mg by mouth at bedtime.     . dorzolamide (TRUSOPT) 2 %  ophthalmic solution Place 1 drop into both eyes 3 (three) times daily. Reported on 11/16/2015    . escitalopram (LEXAPRO) 10 MG tablet Take 1 tablet (10 mg total) by mouth daily. 90 tablet 11  . ferrous sulfate 325 (65 FE) MG tablet Take 325 mg by mouth daily with breakfast.    . fluticasone (FLONASE) 50 MCG/ACT nasal spray Place 2 sprays into both nostrils at bedtime.    . furosemide (LASIX) 40 MG tablet Take 1 tablet (40 mg total) by mouth daily. (Patient taking differently: Take 40 mg by mouth daily with breakfast. ) 30 tablet   . gabapentin (NEURONTIN) 100 MG capsule Take 100 mg by mouth at bedtime.    . lidocaine (LIDODERM) 5 % Place 1 patch onto the skin daily. Apply 1 patch to left lower back/hip every morning.  Remove & Discard patch within 12 hours or as directed by MD (Patient taking differently: Place 1 patch onto the skin daily at 6 (six) AM. Apply 1 patch to left lower back/hip every morning.  Remove & Discard patch within 12 hours or  as directed by MD) 5 patch 0  . Linaclotide (LINZESS) 145 MCG CAPS capsule Take 145 mcg by mouth daily before breakfast.     . loratadine (CLARITIN) 10 MG tablet Take 10 mg by mouth daily before breakfast.     . Multiple Vitamins-Minerals (CERTAGEN PO) Take 1 tablet by mouth daily.     . Nutritional Supplements (TWOCAL HN) LIQD Take 90 mLs by mouth 3 (three) times daily.    . ondansetron (ZOFRAN) 8 MG tablet Take 8 mg by mouth every 8 (eight) hours as needed for nausea or vomiting.    . OXYGEN Inhale 2 L into the lungs continuous.    . polyethylene glycol (MIRALAX / GLYCOLAX) packet Take 17 g by mouth daily at 6 (six) AM.     . potassium chloride SA (K-DUR,KLOR-CON) 20 MEQ tablet Take 20 mEq by mouth daily with breakfast.     . PROTONIX 40 MG PACK Take 40 mg by mouth daily at 6 (six) AM.    . senna (SENOKOT) 8.6 MG TABS tablet Take 1 tablet (8.6 mg total) by mouth 2 (two) times daily. 120 each 0  . Skin Protectants, Misc. (CALAZIME SKIN PROTECTANT EX) Apply  1 application topically as directed. Cleanse buttocks and apply paste daily and as needed every day and night shift for protection.    . sodium chloride (OCEAN) 0.65 % SOLN nasal spray Place 1 spray into both nostrils as directed. Four times daily And every 24 hours as needed to moisturize nasal passages.    . Travoprost, BAK Free, (TRAVATAN) 0.004 % SOLN ophthalmic solution Place 1 drop into both eyes daily at 8 pm.     . vitamin B-12 (CYANOCOBALAMIN) 500 MCG tablet Take 500 mcg by mouth every morning.     Marland Kitchen acetaminophen (TYLENOL) 325 MG tablet Take 2 tablets (650 mg total) by mouth every 6 (six) hours as needed for mild pain. (Patient not taking: Reported on 12/20/2015) 120 tablet 11    Allergies as of 12/20/2015 - Unable to Assess 12/20/2015  Allergen Reaction Noted  . Aspirin Other (See Comments) 05/26/2013     Review of Systems:   (brief due to dementia)  Constitutional: No weight loss, fever or chills HEENT: Eyes: No change in vision               Ears, Nose, Throat:  No change in hearing Skin: No rash Cardiovascular: No chest pain or palpitations      Respiratory: No SOB or cough Gastrointestinal: See HPI and otherwise negative Genitourinary: No dysuria Neurological: Positive for headache No syncope Musculoskeletal: No new joint pain Hematologic: No bleeding Psychiatric: No history of depression or anxiety    Physical Exam:  Vital signs in last 24 hours: Temp:  [97.9 F (36.6 C)-98.3 F (36.8 C)] 97.9 F (36.6 C) (07/17 0800) Pulse Rate:  [65-89] 65 (07/17 0800) Resp:  [13-17] 15 (07/17 0800) BP: (116-133)/(50-53) 116/50 mmHg (07/17 0800) SpO2:  [97 %-100 %] 98 % (07/17 0800) Weight:  [150 lb (68.04 kg)] 150 lb (68.04 kg) (07/17 0755)   General:   Pleasant Caucasian female appears to be in NAD, Well developed, Well nourished, alert and cooperative Head:  Normocephalic and atraumatic. Eyes:   PEERL, EOMI. No icterus. Conjunctiva pink. Ears:  Normal auditory  acuity. Neck:  Supple Throat: Oral cavity and pharynx without inflammation, swelling or lesion. Edentulous Lungs: Respirations even and unlabored. Lungs clear to auscultation bilaterally.   No wheezes, crackles, or rhonchi.  Heart: Normal S1,  S2. No MRG. Regular rate and rhythm. No peripheral edema, cyanosis or pallor.  Abdomen:  Soft, moderately distended, hypertympanic, hyperactive bowel sounds, generalized mild tenderness to palpation, No appreciable masses or hepatomegaly. Rectal:  Not performed.  Msk:  Symmetrical without gross deformities. Peripheral pulses intact.  Extremities:  Without edema, no deformity or joint abnormality.  Neurologic:  Alert and  oriented x2;  grossly normal neurologically. CN II-XII intact.  Skin:   Dry and intact without significant lesions or rashes. Psychiatric: Oriented to person, place and time. Demonstrates good judgement and reason without abnormal affect or behaviors. Dementia   LAB RESULTS:  Recent Labs  12/20/15 0130  WBC 5.2  HGB 11.0*  HCT 32.4*  PLT 107*   BMET  Recent Labs  12/20/15 0130  NA 138  K 3.2*  CL 111  CO2 21*  GLUCOSE 138*  BUN 31*  CREATININE 1.61*  CALCIUM 8.9   LFT  Recent Labs  12/20/15 0130  PROT 7.1  ALBUMIN 3.9  AST 22  ALT 13*  ALKPHOS 95  BILITOT 0.8   PT/INR No results for input(s): LABPROT, INR in the last 72 hours.  STUDIES: Ct Abdomen Pelvis Wo Contrast  12/20/2015  CLINICAL DATA:  Abdominal pain EXAM: CT ABDOMEN AND PELVIS WITHOUT CONTRAST TECHNIQUE: Multidetector CT imaging of the abdomen and pelvis was performed following the standard protocol without IV contrast. COMPARISON:  03/09/2015 FINDINGS: Lower chest and abdominal wall: Cardiomegaly. Extensive coronary and aortic atherosclerosis. Hepatobiliary: No focal liver abnormality.Cholecystectomy. Pancreas: Chronic cystic change in the pancreatic body without appreciable change compared to prior. This area continues to measure 24 mm in  length. Continued stability suggests benign process. Spleen: Unremarkable. Adrenals/Urinary Tract: Negative adrenals.Horseshoe kidney with renal atrophy. No hydronephrosis or stone. Unremarkable bladder. Stomach/Bowel: Distended colon above the distal sigmoid which shows mesenteric and bowel twisting. The twist is relatively loose but still causes transition point. Fluid seen in the rectum. Colonic distention is improved compared to radiography earlier today with distal sigmoid measuring 9 cm in maximal diameter compared at 13 cm previously. Uncomplicated few diverticula. Reproductive:Hysterectomy. Vascular/Lymphatic: No acute vascular abnormality. Extensive atherosclerotic calcification. No mass or adenopathy. Other: Small left pelvic ascites, presumably reactive. Musculoskeletal: No acute finding.  Total left hip arthroplasty. IMPRESSION: 1. Loosely twisted sigmoid volvulus with transition point. Proximal distention is improved compared to radiography earlier today suggesting partial interval decompression. 2. Chronic findings are stable and described above. Electronically Signed   By: Monte Fantasia M.D.   On: 12/20/2015 06:37   Dg Abd Acute W/chest  12/20/2015  CLINICAL DATA:  Ileus. EXAM: DG ABDOMEN ACUTE W/ 1V CHEST COMPARISON:  KUB March 11, 2015 and chest x-ray January 05, 2014 FINDINGS: Stable cardiomegaly. No pneumothorax. Mild increased interstitial opacities suggest mild edema. No free air, portal venous gas, or pneumatosis. Again noted are dilated loops of colon without definitive gas in the rectum. Overall, colonic distention has mildly worsened in the interval measuring up to 12.6 cm today versus 11 cm previously. IMPRESSION: 1. Markedly distended loops of colon without definitive gas in the rectum. Given the similar pattern on previous studies and history, this likely represents chronic colonic ileus. It would be very difficult to exclude a volvulus on this study alone and if there is concern, a  CT scan could better evaluate. Electronically Signed   By: Dorise Bullion III M.D   On: 12/20/2015 03:07     PREVIOUS ENDOSCOPIES:            See  history of present illness   Impression / Plan:   Impression: 1. Sigmoid volvulus: Causing worsening abdominal distention over the past 4 days, CT showing loosely twisted sigmoid volvulus with transition point, somewhat improved compared to abdominal x-ray taken earlier 2. Generalized abdominal pain: Likely related to above, intermittent, 8/10 at its worst 3. Chronic constipation: Patient is unable to tell me, but it appears she is on multiple laxatives, currently expresses that she has been having multiple loose bowel movements per day, for the past 4 days  Plan: 1. Will plan for flex sigmoidoscopy with Dr. Ardis Hughs today 2. Continue supportive measures 3. Will start IV K replacement prior to procedure  4. Ordered 2 fleets enemas to be given one hour prior to flex sig 5. Will discuss above with Dr. Ardis Hughs, please await any further recommendations  Thank you for your kind consultation, we will continue to follow.  Lavone Nian Lemmon  12/20/2015, 8:30 AM Pager #: [phone]  ________________________________________________________________________  Velora Heckler GI MD note:  I personally examined the patient, reviewed the data and agree with the assessment and plan described above.  She's had sigmoid volvulus in 2014 and then an event that was felt more of a colonic ileus in 2015 or 16.  CT scan last year shows narrowing in sigmoid.  I think she probably has chronic twist in her sigmoid colon that periodically twists enough to obstruct.  Previously felt to be poor surgical candidate.  I plan on flex sig later today, will decompress air, stool as best as possible.  I think having surgery see her again is reasonable since this is proving to be a chronic issue for her.     Owens Loffler, MD [facility] Pager [phone]

## 2015-12-20 NOTE — ED Notes (Signed)
Aware of need to hang fluids

## 2015-12-20 NOTE — ED Notes (Signed)
Patient has abdominal distiniction with KUB with acute ileus. Patient comes from [facility] [place] [phone].

## 2015-12-20 NOTE — Progress Notes (Signed)
PHARMACIST - PHYSICIAN ORDER COMMUNICATION  CONCERNING: P&T Medication Policy on Herbal Medications  DESCRIPTION:  This patient's order for:  Biotin liquid has been noted.  This product(s) is classified as an "herbal" or natural product. Due to a lack of definitive safety studies or FDA approval, nonstandard manufacturing practices, plus the potential risk of unknown drug-drug interactions while on inpatient medications, the Pharmacy and Therapeutics Committee does not permit the use of "herbal" or natural products of this type within [facility].   ACTION TAKEN: The pharmacy department is unable to verify this order at this time and your patient has been informed of this safety policy. Please reevaluate patient's clinical condition at discharge and address if the herbal or natural product(s) should be resumed at that time.

## 2015-12-21 ENCOUNTER — Encounter (HOSPITAL_COMMUNITY): Payer: Self-pay | Admitting: Gastroenterology

## 2015-12-21 LAB — BASIC METABOLIC PANEL
Anion gap: 4 — ABNORMAL LOW (ref 5–15)
BUN: 23 mg/dL — AB (ref 6–20)
CALCIUM: 8.4 mg/dL — AB (ref 8.9–10.3)
CO2: 22 mmol/L (ref 22–32)
CREATININE: 1.15 mg/dL — AB (ref 0.44–1.00)
Chloride: 117 mmol/L — ABNORMAL HIGH (ref 101–111)
GFR calc Af Amer: 47 mL/min — ABNORMAL LOW (ref >60)
GFR, EST NON AFRICAN AMERICAN: 41 mL/min — AB (ref >60)
GLUCOSE: 108 mg/dL — AB (ref 65–99)
Potassium: 3.2 mmol/L — ABNORMAL LOW (ref 3.5–5.1)
Sodium: 143 mmol/L (ref 135–145)

## 2015-12-21 LAB — CBC
HEMATOCRIT: 29.7 % — AB (ref 36.0–46.0)
Hemoglobin: 9.8 g/dL — ABNORMAL LOW (ref 12.0–15.0)
MCH: 34 pg (ref 26.0–34.0)
MCHC: 33 g/dL (ref 30.0–36.0)
MCV: 103.1 fL — AB (ref 78.0–100.0)
PLATELETS: 98 10*3/uL — AB (ref 150–400)
RBC: 2.88 MIL/uL — ABNORMAL LOW (ref 3.87–5.11)
RDW: 14.3 % (ref 11.5–15.5)
WBC: 3.9 10*3/uL — ABNORMAL LOW (ref 4.0–10.5)

## 2015-12-21 LAB — MAGNESIUM: Magnesium: 2.4 mg/dL (ref 1.7–2.4)

## 2015-12-21 LAB — CBC AND DIFFERENTIAL
HEMATOCRIT: 30 % — AB (ref 36–46)
Hemoglobin: 9.8 g/dL — AB (ref 12.0–16.0)
PLATELETS: 98 10*3/uL — AB (ref 150–399)
WBC: 3.9 10*3/mL

## 2015-12-21 MED ORDER — DEXTROSE 5 % IV SOLN
1.0000 g | INTRAVENOUS | Status: DC
  Filled 2015-12-21: qty 10

## 2015-12-21 MED ORDER — SODIUM CHLORIDE 0.9 % IV SOLN
INTRAVENOUS | Status: DC
  Administered 2015-12-21 (×2): via INTRAVENOUS

## 2015-12-21 MED ORDER — ASPIRIN 81 MG PO CHEW
81.0000 mg | CHEWABLE_TABLET | Freq: Every day | ORAL | Status: DC
Start: 2015-12-21 — End: 2015-12-21

## 2015-12-21 MED ORDER — DEXTROSE 5 % IV SOLN
1.0000 g | INTRAVENOUS | Status: DC
  Administered 2015-12-21 – 2015-12-22 (×2): 1 g via INTRAVENOUS
  Filled 2015-12-21 (×3): qty 10

## 2015-12-21 MED ORDER — ASPIRIN EC 81 MG PO TBEC
81.0000 mg | DELAYED_RELEASE_TABLET | Freq: Every day | ORAL | Status: DC
  Administered 2015-12-21 – 2015-12-23 (×3): 81 mg via ORAL
  Filled 2015-12-21 (×3): qty 1

## 2015-12-21 MED ORDER — POTASSIUM CHLORIDE 10 MEQ/100ML IV SOLN
10.0000 meq | INTRAVENOUS | Status: AC
  Administered 2015-12-21 (×4): 10 meq via INTRAVENOUS
  Filled 2015-12-21 (×5): qty 100

## 2015-12-21 MED ORDER — ENOXAPARIN SODIUM 40 MG/0.4ML ~~LOC~~ SOLN
40.0000 mg | SUBCUTANEOUS | Status: DC
  Administered 2015-12-21 – 2015-12-22 (×2): 40 mg via SUBCUTANEOUS
  Filled 2015-12-21 (×2): qty 0.4

## 2015-12-21 NOTE — Progress Notes (Signed)
1 Day Post-Op  Subjective: She looks quite good, says she had breakfast and listed eggs and sausage.  Reports BM, but still worried about stomach being swollen.   No other complaints   Objective: Vital signs in last 24 hours: Temp:  [98.4 F (36.9 C)-98.7 F (37.1 C)] 98.7 F (37.1 C) (07/18 0645) Pulse Rate:  [46-63] 53 (07/18 0645) Resp:  [8-18] 18 (07/18 0645) BP: (109-136)/(45-51) 132/50 mmHg (07/18 0645) SpO2:  [98 %-100 %] 100 % (07/18 0645) Last BM Date: 12/20/15 I/O: ? Afebrile VSS K+ 3.2 Creatinine is trending down   Intake/Output from previous day: 07/17 0701 - 07/18 0700 In: 200 [I.V.:200] Out: -  Intake/Output this shift:    General appearance: alert, cooperative and no distress GI: soft, may still be somewhat distened.  +BS and BM reported, not recorded.  Lab Results:   Recent Labs  12/20/15 0130 12/21/15 0519  WBC 5.2 3.9*  HGB 11.0* 9.8*  HCT 32.4* 29.7*  PLT 107* 98*    BMET  Recent Labs  12/20/15 0130 12/21/15 0519  NA 138 143  K 3.2* 3.2*  CL 111 117*  CO2 21* 22  GLUCOSE 138* 108*  BUN 31* 23*  CREATININE 1.61* 1.15*  CALCIUM 8.9 8.4*   PT/INR No results for input(s): LABPROT, INR in the last 72 hours.   Recent Labs Lab 12/20/15 0130  AST 22  ALT 13*  ALKPHOS 95  BILITOT 0.8  PROT 7.1  ALBUMIN 3.9     Lipase     Component Value Date/Time   LIPASE 30 12/20/2015 0130     Studies/Results: Ct Abdomen Pelvis Wo Contrast  12/20/2015  CLINICAL DATA:  Abdominal pain EXAM: CT ABDOMEN AND PELVIS WITHOUT CONTRAST TECHNIQUE: Multidetector CT imaging of the abdomen and pelvis was performed following the standard protocol without IV contrast. COMPARISON:  03/09/2015 FINDINGS: Lower chest and abdominal wall: Cardiomegaly. Extensive coronary and aortic atherosclerosis. Hepatobiliary: No focal liver abnormality.Cholecystectomy. Pancreas: Chronic cystic change in the pancreatic body without appreciable change compared to prior. This  area continues to measure 24 mm in length. Continued stability suggests benign process. Spleen: Unremarkable. Adrenals/Urinary Tract: Negative adrenals.Horseshoe kidney with renal atrophy. No hydronephrosis or stone. Unremarkable bladder. Stomach/Bowel: Distended colon above the distal sigmoid which shows mesenteric and bowel twisting. The twist is relatively loose but still causes transition point. Fluid seen in the rectum. Colonic distention is improved compared to radiography earlier today with distal sigmoid measuring 9 cm in maximal diameter compared at 13 cm previously. Uncomplicated few diverticula. Reproductive:Hysterectomy. Vascular/Lymphatic: No acute vascular abnormality. Extensive atherosclerotic calcification. No mass or adenopathy. Other: Small left pelvic ascites, presumably reactive. Musculoskeletal: No acute finding.  Total left hip arthroplasty. IMPRESSION: 1. Loosely twisted sigmoid volvulus with transition point. Proximal distention is improved compared to radiography earlier today suggesting partial interval decompression. 2. Chronic findings are stable and described above. Electronically Signed   By: Monte Fantasia M.D.   On: 12/20/2015 06:37   Dg Abd Acute W/chest  12/20/2015  CLINICAL DATA:  Ileus. EXAM: DG ABDOMEN ACUTE W/ 1V CHEST COMPARISON:  KUB March 11, 2015 and chest x-ray January 05, 2014 FINDINGS: Stable cardiomegaly. No pneumothorax. Mild increased interstitial opacities suggest mild edema. No free air, portal venous gas, or pneumatosis. Again noted are dilated loops of colon without definitive gas in the rectum. Overall, colonic distention has mildly worsened in the interval measuring up to 12.6 cm today versus 11 cm previously. IMPRESSION: 1. Markedly distended loops of colon without definitive  gas in the rectum. Given the similar pattern on previous studies and history, this likely represents chronic colonic ileus. It would be very difficult to exclude a volvulus on this study  alone and if there is concern, a CT scan could better evaluate. Electronically Signed   By: Dorise Bullion III M.D   On: 12/20/2015 03:07    Medications: . baclofen  5 mg Oral QHS  . carvedilol  3.125 mg Oral Daily  . clotrimazole  1 application Topical BID  . docusate sodium  200 mg Oral QHS  . dorzolamide  1 drop Both Eyes TID  . enoxaparin (LOVENOX) injection  30 mg Subcutaneous Q24H  . escitalopram  10 mg Oral Daily  . fluticasone  2 spray Each Nare QHS  . gabapentin  100 mg Oral QHS  . latanoprost  1 drop Both Eyes QHS  . lidocaine  1 patch Transdermal Q0600  . linaclotide  145 mcg Oral QAC breakfast  . loratadine  10 mg Oral QAC breakfast  . pantoprazole sodium  40 mg Oral Q0600  . polyethylene glycol  17 g Oral Q0600  . senna  1 tablet Oral BID  . sodium chloride  1 spray Each Nare UD  . sodium chloride flush  3 mL Intravenous Q12H  . vancomycin  250 mg Oral Q6H   . sodium chloride 75 mL/hr at 12/21/15 0651   Hx of TIA Bed ridden for some years Hx of atrial fibrillation  CAD/Cardiomyopathy  Pulmonary hypertension Aortic and mitral sclerosis, mild MR Chronic kidney disease Hx of GI bleed/duodenal ulcers  AODM Hx of DVT, off chronic anticoagulation   Assessment/Plan Recurrent sigmoid volvulus Hx of constipation  Long term Dementia/Parkinson'd disease Recurrent UTI FEN: soft diet/IV fluids ID: Vancomycin day 2 DVT:  Lovenox  Plan:  No need for surgical intervention.  Will be available as needed.       LOS: 1 day    Alesha Jaffee 12/21/2015 [phone]

## 2015-12-21 NOTE — Consult Note (Signed)
   [facility] CM Inpatient Consult   12/21/2015  Maria Christian 1925/06/05 [id number]   Patient screened for potential [facility] Care Management services. Chart reviewed. Noted patient is from SNF and likely plan is to return there. Spoke with inpatient RNCM to confirm.  There are no identifiable [facility] Care Management needs at this time.   Marthenia Rolling, MSN-Ed, RN,BSN [facility] Liaison [phone]

## 2015-12-21 NOTE — Progress Notes (Signed)
PROGRESS NOTE                                                                                                                                                                                                             Patient Demographics:    Maria Christian, is a 82 y.o. female, DOB - 09-06-1924, [place]:[id number]  Admit date - 12/20/2015   Admitting Physician Florencia Reasons, MD  Outpatient Primary MD for the patient is Maria Duos, MD  LOS - 1  Chief Complaint  Patient presents with  . Abdominal Pain       Brief Narrative    Maria Christian is a 82 y.o. Female from SNF baseline not able bed to wheelchair bound, With h/o dementia, Parkinson's disease, HTN, diet controlled diabetes, chronic diastolic heart failure, atrial fibrillation not on anticoagulation secondary to risk of falls, Chronic hypoxia on home o2 is sent to [facility] ED due to abdominal distension/pain.   ED course: her vital is stable on arrival, CT ab/ple reviewed sigmoid volvulus with transition point. Labs wbc 5.2, hgb 11, k3.2 , cr 1.6, ua with many bacteria, large leukocytes, + nitrate, she is given analagesics, ivf, EDP called GI who plan to do decompression flex sig, hospitalist called to admit the patient.  Patient is not able to provide detailed history, but denies chest pain, sob, no cough, no fever, no lower extremity edema, she reported abdominal pain, diarrhea since last Thursday.    Subjective:    [name] today has, No headache, No chest pain, No abdominal pain - No Nausea, No new weakness tingling or numbness, No Cough - SOB. Had a BM, belly mildly distended./   Assessment  & Plan :     1.Abdominal pain due to sigmoid volvulus. Not completely instructed, still having bowel movements, no nausea, GI and Gen. surgery following, on soft diet and IV fluids, per general surgery continue to monitor. Pain completely resolved at this time.  2. UTI. 3  days of Rocephin.  3. Diarrhea. Question C. difficile, to be ruled out, for now on empiric vancomycin. Minimize antibiotics for #2 above as much as possible until C. difficile is ruled out.  4. ARF. Due to dehydration from #1 above. Resolved with IV fluids.  5. Chronic hypoxic respiratory failure. On home oxygen, continue supportive care.  6. Diastolic diastolic  CHF. EF 55-60%. Compensated. Continue Coreg.  7. GERD. On PPI.  8. Hx of chronic atrial fibrillation. [place] vasc 2 score for at least 4. Continue Coreg for rate control, continue home dose aspirin, not on anticoagulation defer this to PCP and primary cardiologist.  9. History of dementia. Bedbound, Parkinson's disease. Stable supportive care.  10. DM type II. Diet controlled.    Family Communication  :  None present  Code Status :  DNR  Diet : Soft  Disposition Plan  :  Stay Inpt, from SNF  Consults  :  [facility] GI  Procedures  :    CT abdomen and pelvis with possible sigmoid Volvulus   DVT Prophylaxis  :  Lovenox    Lab Results  Component Value Date   PLT 98* 12/21/2015    Inpatient Medications  Scheduled Meds: . baclofen  5 mg Oral QHS  . carvedilol  3.125 mg Oral Daily  . cefTRIAXone (ROCEPHIN)  IV  1 g Intravenous Q24H  . clotrimazole  1 application Topical BID  . docusate sodium  200 mg Oral QHS  . dorzolamide  1 drop Both Eyes TID  . enoxaparin (LOVENOX) injection  30 mg Subcutaneous Q24H  . escitalopram  10 mg Oral Daily  . fluticasone  2 spray Each Nare QHS  . gabapentin  100 mg Oral QHS  . latanoprost  1 drop Both Eyes QHS  . lidocaine  1 patch Transdermal Q0600  . linaclotide  145 mcg Oral QAC breakfast  . loratadine  10 mg Oral QAC breakfast  . pantoprazole sodium  40 mg Oral Q0600  . polyethylene glycol  17 g Oral Q0600  . senna  1 tablet Oral BID  . sodium chloride  1 spray Each Nare UD  . sodium chloride flush  3 mL Intravenous Q12H  . vancomycin  250 mg Oral Q6H   Continuous  Infusions: . sodium chloride 75 mL/hr at 12/21/15 0651   PRN Meds:.acetaminophen  Antibiotics  :    Anti-infectives    Start     Dose/Rate Route Frequency Ordered Stop   12/21/15 1300  cefTRIAXone (ROCEPHIN) 1 g in dextrose 5 % 50 mL IVPB     1 g 100 mL/hr over 30 Minutes Intravenous Every 24 hours 12/21/15 1255     12/20/15 1400  vancomycin (VANCOCIN) 50 mg/mL oral solution 250 mg     250 mg Oral Every 6 hours 12/20/15 1316           Objective:   Filed Vitals:   12/20/15 1420 12/20/15 2138 12/21/15 0645 12/21/15 1000  BP: 131/48 130/46 132/50   Pulse: 46 50 53 65  Temp:  98.4 F (36.9 C) 98.7 F (37.1 C)   TempSrc:  Oral Oral   Resp: 11 18 18    Height:      Weight:      SpO2: 100% 100% 100%     Wt Readings from Last 3 Encounters:  12/20/15 68.04 kg (150 lb)  11/16/15 68.947 kg (152 lb)  11/04/15 68.493 kg (151 lb)     Intake/Output Summary (Last 24 hours) at 12/21/15 1256 Last data filed at 12/21/15 0800  Gross per 24 hour  Intake    440 ml  Output      0 ml  Net    440 ml     Physical Exam  Awake Alert,   No new F.N deficits, Normal affect [place].AT,PERRAL Supple Neck,No JVD, No cervical lymphadenopathy appriciated.  Symmetrical Chest wall  movement, Good air movement bilaterally, CTAB RRR,No Gallops,Rubs or new Murmurs, No Parasternal Heave +ve B.Sounds, Abd Soft but mildly distended, No tenderness, No organomegaly appriciated, No rebound - guarding or rigidity. No Cyanosis, Clubbing or edema, No new Rash or bruise       Data Review:    CBC  Recent Labs Lab 12/20/15 0130 12/21/15 0519  WBC 5.2 3.9*  HGB 11.0* 9.8*  HCT 32.4* 29.7*  PLT 107* 98*  MCV 101.3* 103.1*  MCH 34.4* 34.0  MCHC 34.0 33.0  RDW 13.8 14.3    Chemistries   Recent Labs Lab 12/20/15 0130 12/21/15 0519  NA 138 143  K 3.2* 3.2*  CL 111 117*  CO2 21* 22  GLUCOSE 138* 108*  BUN 31* 23*  CREATININE 1.61* 1.15*  CALCIUM 8.9 8.4*  MG  --  2.4  AST 22  --   ALT  13*  --   ALKPHOS 95  --   BILITOT 0.8  --    ------------------------------------------------------------------------------------------------------------------ No results for input(s): CHOL, HDL, LDLCALC, TRIG, CHOLHDL, LDLDIRECT in the last 72 hours.  Lab Results  Component Value Date   HGBA1C 6.1* 07/17/2014   ------------------------------------------------------------------------------------------------------------------ No results for input(s): TSH, T4TOTAL, T3FREE, THYROIDAB in the last 72 hours.  Invalid input(s): FREET3 ------------------------------------------------------------------------------------------------------------------ No results for input(s): VITAMINB12, FOLATE, FERRITIN, TIBC, IRON, RETICCTPCT in the last 72 hours.  Coagulation profile No results for input(s): INR, PROTIME in the last 168 hours.  No results for input(s): DDIMER in the last 72 hours.  Cardiac Enzymes No results for input(s): CKMB, TROPONINI, MYOGLOBIN in the last 168 hours.  Invalid input(s): CK ------------------------------------------------------------------------------------------------------------------ No results found for: BNP  Micro Results Recent Results (from the past 240 hour(s))  Culture, Urine     Status: Abnormal (Preliminary result)   Collection Time: 12/20/15  5:55 AM  Result Value Ref Range Status   Specimen Description URINE, CLEAN CATCH  Final   Special Requests NONE  Final   Culture >=100,000 COLONIES/mL GRAM NEGATIVE RODS (A)  Final   Report Status PENDING  Incomplete  MRSA PCR Screening     Status: None   Collection Time: 12/20/15  6:22 PM  Result Value Ref Range Status   MRSA by PCR NEGATIVE NEGATIVE Final    Comment:        The GeneXpert MRSA Assay (FDA approved for NASAL specimens only), is one component of a comprehensive MRSA colonization surveillance program. It is not intended to diagnose MRSA infection nor to guide or monitor treatment for MRSA  infections.     Radiology Reports Ct Abdomen Pelvis Wo Contrast  12/20/2015  CLINICAL DATA:  Abdominal pain EXAM: CT ABDOMEN AND PELVIS WITHOUT CONTRAST TECHNIQUE: Multidetector CT imaging of the abdomen and pelvis was performed following the standard protocol without IV contrast. COMPARISON:  03/09/2015 FINDINGS: Lower chest and abdominal wall: Cardiomegaly. Extensive coronary and aortic atherosclerosis. Hepatobiliary: No focal liver abnormality.Cholecystectomy. Pancreas: Chronic cystic change in the pancreatic body without appreciable change compared to prior. This area continues to measure 24 mm in length. Continued stability suggests benign process. Spleen: Unremarkable. Adrenals/Urinary Tract: Negative adrenals.Horseshoe kidney with renal atrophy. No hydronephrosis or stone. Unremarkable bladder. Stomach/Bowel: Distended colon above the distal sigmoid which shows mesenteric and bowel twisting. The twist is relatively loose but still causes transition point. Fluid seen in the rectum. Colonic distention is improved compared to radiography earlier today with distal sigmoid measuring 9 cm in maximal diameter compared at 13 cm previously. Uncomplicated few diverticula. Reproductive:Hysterectomy. Vascular/Lymphatic:  No acute vascular abnormality. Extensive atherosclerotic calcification. No mass or adenopathy. Other: Small left pelvic ascites, presumably reactive. Musculoskeletal: No acute finding.  Total left hip arthroplasty. IMPRESSION: 1. Loosely twisted sigmoid volvulus with transition point. Proximal distention is improved compared to radiography earlier today suggesting partial interval decompression. 2. Chronic findings are stable and described above. Electronically Signed   By: Monte Fantasia M.D.   On: 12/20/2015 06:37   Dg Abd Acute W/chest  12/20/2015  CLINICAL DATA:  Ileus. EXAM: DG ABDOMEN ACUTE W/ 1V CHEST COMPARISON:  KUB March 11, 2015 and chest x-ray January 05, 2014 FINDINGS: Stable  cardiomegaly. No pneumothorax. Mild increased interstitial opacities suggest mild edema. No free air, portal venous gas, or pneumatosis. Again noted are dilated loops of colon without definitive gas in the rectum. Overall, colonic distention has mildly worsened in the interval measuring up to 12.6 cm today versus 11 cm previously. IMPRESSION: 1. Markedly distended loops of colon without definitive gas in the rectum. Given the similar pattern on previous studies and history, this likely represents chronic colonic ileus. It would be very difficult to exclude a volvulus on this study alone and if there is concern, a CT scan could better evaluate. Electronically Signed   By: Dorise Bullion III M.D   On: 12/20/2015 03:07    Time Spent in minutes  30   Lala Lund K M.D on 12/21/2015 at 12:56 PM  Between 7am to 7pm - Pager - [phone]  After 7pm go to [email or web address] - password [facility]  [facility] -  Office  [phone]

## 2015-12-22 DIAGNOSIS — R197 Diarrhea, unspecified: Secondary | ICD-10-CM

## 2015-12-22 DIAGNOSIS — N179 Acute kidney failure, unspecified: Secondary | ICD-10-CM

## 2015-12-22 DIAGNOSIS — N390 Urinary tract infection, site not specified: Secondary | ICD-10-CM

## 2015-12-22 LAB — CBC
HEMATOCRIT: 32.3 % — AB (ref 36.0–46.0)
Hemoglobin: 10.8 g/dL — ABNORMAL LOW (ref 12.0–15.0)
MCH: 34.5 pg — ABNORMAL HIGH (ref 26.0–34.0)
MCHC: 33.4 g/dL (ref 30.0–36.0)
MCV: 103.2 fL — AB (ref 78.0–100.0)
PLATELETS: 101 10*3/uL — AB (ref 150–400)
RBC: 3.13 MIL/uL — ABNORMAL LOW (ref 3.87–5.11)
RDW: 14.5 % (ref 11.5–15.5)
WBC: 5.1 10*3/uL (ref 4.0–10.5)

## 2015-12-22 LAB — HEPATIC FUNCTION PANEL: BILIRUBIN, TOTAL: 0.8 mg/dL

## 2015-12-22 LAB — CBC AND DIFFERENTIAL: WBC: 5.1 10^3/mL

## 2015-12-22 LAB — BASIC METABOLIC PANEL
Anion gap: 5 (ref 5–15)
BUN: 18 mg/dL (ref 4–21)
BUN: 18 mg/dL (ref 6–20)
CHLORIDE: 117 mmol/L — AB (ref 101–111)
CO2: 20 mmol/L — ABNORMAL LOW (ref 22–32)
CREATININE: 1.09 mg/dL — AB (ref 0.44–1.00)
Calcium: 8.7 mg/dL — ABNORMAL LOW (ref 8.9–10.3)
Creatinine: 1.1 mg/dL (ref 0.5–1.1)
GFR calc Af Amer: 51 mL/min — ABNORMAL LOW (ref >60)
GFR, EST NON AFRICAN AMERICAN: 44 mL/min — AB (ref >60)
GLUCOSE: 125 mg/dL
GLUCOSE: 125 mg/dL — AB (ref 65–99)
POTASSIUM: 3.9 mmol/L (ref 3.5–5.1)
SODIUM: 142 mmol/L (ref 135–145)
Sodium: 142 mmol/L (ref 137–147)

## 2015-12-22 LAB — URINE CULTURE

## 2015-12-22 MED ORDER — ALBUTEROL SULFATE (2.5 MG/3ML) 0.083% IN NEBU
2.5000 mg | INHALATION_SOLUTION | RESPIRATORY_TRACT | Status: DC | PRN
  Administered 2015-12-22: 2.5 mg via RESPIRATORY_TRACT
  Filled 2015-12-22: qty 3

## 2015-12-22 NOTE — Progress Notes (Signed)
Discussed with son [name] [facility]) about patient disposition back to SNF.

## 2015-12-22 NOTE — NC FL2 (Signed)
[place] LEVEL OF CARE SCREENING TOOL     IDENTIFICATION  Patient Name: Maria Christian Birthdate: [birth year] Sex: female Admission Date (Current Location): 12/20/2015  [facility] and [place] Number:  Herbalist and Address:  [facility],  [place] [address], [place]      Provider Number: [id number]  Attending Physician Name and Address:  Jonetta Osgood, MD  Relative Name and Phone Number:       Current Level of Care: Hospital Recommended Level of Care: [place] Prior Approval Number:    Date Approved/Denied:   PASRR Number:    Discharge Plan: SNF    Current Diagnoses: Patient Active Problem List   Diagnosis Date Noted  . Sigmoid volvulus ([place]) 12/20/2015  . Hypertensive heart disease with congestive heart failure ([place]) 12/02/2015  . Vitamin B12 deficiency 09/27/2015  . Skin yeast infection 05/10/2015  . Abrasion of buttock 05/10/2015  . Infection due to ESBL-producing Escherichia coli 04/19/2015  . Low back pain potentially associated with radiculopathy 04/18/2015  . Chronic respiratory failure ([place]) 03/16/2015  . Allergic rhinitis 03/16/2015  . Chronic pain 03/16/2015  . Osteoarthritis of multiple joints 03/16/2015  . Duodenal ulcer 03/12/2015  . GI bleed 03/11/2015  . Melena 03/11/2015  . Pressure ulcer 03/11/2015  . Headache 03/09/2015  . Abdominal distension 03/01/2015  . Cephalgia 03/01/2015  . Nausea and vomiting 02/27/2015  . Constipation 02/27/2015  . Anemia, iron deficiency 02/27/2015  . Anemia of chronic disease 02/27/2015  . Chronic atrial fibrillation ([place]) 02/27/2015  . Hypokalemia 02/27/2015  . CKD (chronic kidney disease) stage 3, GFR 30-59 ml/min 02/27/2015  . Ileus ([place]) 02/26/2015  . Malnutrition of moderate degree ([place]) 10/10/2013  . FTT (failure to thrive) in adult 07/29/2013  . Dementia 12/19/2012  . Depression 12/19/2012  . Coronary artery disease   . Diastolic CHF,  chronic ([place]) 05/24/2011    Orientation RESPIRATION BLADDER Height & Weight     Self, Situation, Place,Time  Normal Incontinent  Weight: 150 lb (68.04 kg) Height:  5\' 7"  (170.2 cm)  BEHAVIORAL SYMPTOMS/MOOD NEUROLOGICAL BOWEL NUTRITION STATUS      Incontinent Diet: Soft Diet  AMBULATORY STATUS COMMUNICATION OF NEEDS Skin   Extensive Assist Verbally PU Stage and Appropriate Care (Pressure Ulcer Stage 1 intact skin with non blanche redness of a localized area usually over a boney promience) PU Stage 1 Dressing: No Dressing                     Personal Care Assistance Level of Assistance  Bathing, Feeding, Dressing Bathing Assistance: Limited assistance Feeding assistance: Independent Dressing Assistance: Limited assistance     Functional Limitations Info  Sight, Hearing, Speech Sight Info: Adequate Hearing Info: Adequate Speech Info: Adequate    SPECIAL CARE FACTORS FREQUENCY                       Contractures Contractures Info: Not present    Additional Factors Info  Code Status, Allergies Code Status Info: DNR Allergies Info: Aspirin           Current Medications (12/22/2015):  This is the current hospital active medication list Current Facility-Administered Medications  Medication Dose Route Frequency Provider Last Rate Last Dose  . acetaminophen (TYLENOL) tablet 650 mg  650 mg Oral Q6H PRN Gardiner Barefoot, NP   650 mg at 12/21/15 2222  . aspirin EC tablet 81 mg  81 mg Oral Daily Prashant  Jennette Kettle, MD   81 mg at 12/22/15 0923  . baclofen (LIORESAL) tablet 5 mg  5 mg Oral QHS Florencia Reasons, MD   5 mg at 12/21/15 2152  . carvedilol (COREG) tablet 3.125 mg  3.125 mg Oral Daily Florencia Reasons, MD   3.125 mg at 12/21/15 1000  . cefTRIAXone (ROCEPHIN) 1 g in dextrose 5 % 50 mL IVPB  1 g Intravenous Q24H Thurnell Lose, MD   1 g at 12/21/15 1416  . clotrimazole (LOTRIMIN) 1 % cream 1 application  1 application Topical BID Florencia Reasons, MD   1 application at [id number] [phone]   . docusate sodium (COLACE) capsule 200 mg  200 mg Oral QHS Florencia Reasons, MD   200 mg at 12/21/15 2151  . dorzolamide (TRUSOPT) 2 % ophthalmic solution 1 drop  1 drop Both Eyes TID Florencia Reasons, MD   1 drop at 12/22/15 0923  . enoxaparin (LOVENOX) injection 40 mg  40 mg Subcutaneous Q24H Thomes Lolling, RPH   40 mg at 12/21/15 2151  . escitalopram (LEXAPRO) tablet 10 mg  10 mg Oral Daily Florencia Reasons, MD   10 mg at 12/22/15 0923  . fluticasone (FLONASE) 50 MCG/ACT nasal spray 2 spray  2 spray Each Nare QHS Florencia Reasons, MD      . gabapentin (NEURONTIN) capsule 100 mg  100 mg Oral QHS Florencia Reasons, MD   100 mg at 12/21/15 2153  . latanoprost (XALATAN) 0.005 % ophthalmic solution 1 drop  1 drop Both Eyes QHS Florencia Reasons, MD   1 drop at 12/21/15 2154  . lidocaine (LIDODERM) 5 % 1 patch  1 patch Transdermal Q0600 Florencia Reasons, MD   1 patch at 12/22/15 0617  . linaclotide (LINZESS) capsule 145 mcg  145 mcg Oral QAC breakfast Florencia Reasons, MD   145 mcg at 12/22/15 0756  . loratadine (CLARITIN) tablet 10 mg  10 mg Oral QAC breakfast Florencia Reasons, MD   10 mg at 12/22/15 0756  . pantoprazole sodium (PROTONIX) 40 mg/20 mL oral suspension 40 mg  40 mg Oral Q0600 Florencia Reasons, MD   40 mg at 12/22/15 0618  . polyethylene glycol (MIRALAX / GLYCOLAX) packet 17 g  17 g Oral Q0600 Florencia Reasons, MD   17 g at 12/22/15 0618  . senna (SENOKOT) tablet 8.6 mg  1 tablet Oral BID Florencia Reasons, MD   8.6 mg at 12/21/15 2153  . sodium chloride (OCEAN) 0.65 % nasal spray 1 spray  1 spray Each Nare UD Florencia Reasons, MD      . sodium chloride flush (NS) 0.9 % injection 3 mL  3 mL Intravenous Q12H Florencia Reasons, MD   3 mL at 12/22/15 1000  . vancomycin (VANCOCIN) 50 mg/mL oral solution 250 mg  250 mg Oral Q6H Florencia Reasons, MD   250 mg at 12/22/15 [id number]     Discharge Medications: Please see discharge summary for a list of discharge medications.  Relevant Imaging Results:  Relevant Lab Results:   Additional Information [id number]  Lia Hopping, LCSW

## 2015-12-22 NOTE — Progress Notes (Signed)
PROGRESS NOTE        PATIENT DETAILS Name: Maria Christian Age: 82 y.o. Sex: female Date of Birth: April 25, 1925 Admit Date: 12/20/2015 Admitting Physician Florencia Reasons, MD [id number], Noah Delaine, MD   Brief Narrative: Patient is a 82 y.o. female SNF resident-normal LEEP bed to wheelchair bound, with history of dementia, Parkinson's disease, atrial fibrillation not on anticoagulation, recurrent volvulus-admitted to the hospital with abdominal pain and distention, found to have volvulus. Underwent flexible sigmoidoscopy and decompression on 7/17.  Subjective: Diarrhea has improved. Only 1 BM today.  Assessment/Plan: Active Problems: Sigmoid volvulus: No further abdominal distention, underwent decompression by flexible sigmoidoscopy on 7/17. Tolerating diet. GI and general surgery following, general surgery with no plans for any surgical repair at this time.   Diarrhea: Seems to have resolved, although started on empiric vancomycin-stool studies never sent as diarrhea resolved. We will discontinue vancomycin and monitor.  ? UTI: I'm not sure whether she is symptomatic-does occasionally acknowledge dysuria-urine culture positive for pansensitive Escherichia coli. Will plan on a very short course of antibiotics, stop date for Rocephin 7/20.  Acute kidney injury: Likely mild prerenal azotemia, resolved with IV fluids.   Chronic hypoxemic respiratory failure: Continue home O2.  Chronic diastolic heart failure: Clinically compensated, continue Coreg  GERD: PPI  History of chronic atrial fibrillation: Continue Coreg for rate control, chads 2 vascular for at least 4-suspect not on anticoagulation due to advanced age, frailty and high risk for bleeding.  History of Parkinson's disease/dementia: Appears stable.   Type 2 diabetes: Diet controlled.   DVT Prophylaxis: Prophylactic Lovenox   Code Status: DNR  Family Communication: None at bedside  Disposition  Plan: Remain inpatient-SNF on discharge-likely on 7/20  Antimicrobial agents: IV Rocephin 7/18>> Oral vancomycin 7/17>> 7/19  Procedures: Flexible sigmoidoscopy 7/17  CONSULTS:  GI  Time spent: 25 minutes-Greater than 50% of this time was spent in counseling, explanation of diagnosis, planning of further management, and coordination of care.  MEDICATIONS: Anti-infectives    Start     Dose/Rate Route Frequency Ordered Stop   12/21/15 1400  cefTRIAXone (ROCEPHIN) 1 g in dextrose 5 % 50 mL IVPB  Status:  Discontinued     1 g 100 mL/hr over 30 Minutes Intravenous Every 24 hours 12/21/15 1255 12/21/15 1302   12/21/15 1400  cefTRIAXone (ROCEPHIN) 1 g in dextrose 5 % 50 mL IVPB     1 g 100 mL/hr over 30 Minutes Intravenous Every 24 hours 12/21/15 1302 12/24/15 1359   12/20/15 1400  vancomycin (VANCOCIN) 50 mg/mL oral solution 250 mg  Status:  Discontinued     250 mg Oral Every 6 hours 12/20/15 1316 12/22/15 1330      Scheduled Meds: . aspirin EC  81 mg Oral Daily  . baclofen  5 mg Oral QHS  . carvedilol  3.125 mg Oral Daily  . cefTRIAXone (ROCEPHIN)  IV  1 g Intravenous Q24H  . clotrimazole  1 application Topical BID  . docusate sodium  200 mg Oral QHS  . dorzolamide  1 drop Both Eyes TID  . enoxaparin (LOVENOX) injection  40 mg Subcutaneous Q24H  . escitalopram  10 mg Oral Daily  . fluticasone  2 spray Each Nare QHS  . gabapentin  100 mg Oral QHS  . latanoprost  1 drop Both Eyes QHS  . lidocaine  1 patch Transdermal Q0600  .  linaclotide  145 mcg Oral QAC breakfast  . loratadine  10 mg Oral QAC breakfast  . pantoprazole sodium  40 mg Oral Q0600  . polyethylene glycol  17 g Oral Q0600  . senna  1 tablet Oral BID  . sodium chloride  1 spray Each Nare UD  . sodium chloride flush  3 mL Intravenous Q12H   Continuous Infusions:  PRN Meds:.acetaminophen   PHYSICAL EXAM: Vital signs: Filed Vitals:   12/21/15 1534 12/21/15 2039 12/22/15 0441 12/22/15 0925  BP: 94/68 149/55  140/72   Pulse: 69 51 73 59  Temp: 98.6 F (37 C) 98.1 F (36.7 C) 98.1 F (36.7 C)   TempSrc: Oral Oral Oral   Resp: 18 20 18    Height:      Weight:      SpO2: 100% 100% 99%    Filed Weights   12/20/15 0755  Weight: 68.04 kg (150 lb)   Body mass index is 23.49 kg/(m^2).   Gen Exam: Awake, pleasantly confused but able to answer some questions appropriately. Not in any distress. Neck: Supple, No JVD.   Chest: B/L Clear.   CVS: S1 S2 Regular Abdomen: soft, BS +, non tender, non distended.  Extremities: no edema, lower extremities warm to touch. Neurologic: Non Focal.   Skin: No Rash or lesions   Wounds: N/A.   LABORATORY DATA: CBC:  Recent Labs Lab 12/20/15 0130 12/21/15 0519 12/22/15 0543  WBC 5.2 3.9* 5.1  HGB 11.0* 9.8* 10.8*  HCT 32.4* 29.7* 32.3*  MCV 101.3* 103.1* 103.2*  PLT 107* 98* 101*    Basic Metabolic Panel:  Recent Labs Lab 12/20/15 0130 12/21/15 0519 12/22/15 0543  NA 138 143 142  K 3.2* 3.2* 3.9  CL 111 117* 117*  CO2 21* 22 20*  GLUCOSE 138* 108* 125*  BUN 31* 23* 18  CREATININE 1.61* 1.15* 1.09*  CALCIUM 8.9 8.4* 8.7*  MG  --  2.4  --     GFR: Estimated Creatinine Clearance: 34 mL/min (by C-G formula based on Cr of 1.09).  Liver Function Tests:  Recent Labs Lab 12/20/15 0130  AST 22  ALT 13*  ALKPHOS 95  BILITOT 0.8  PROT 7.1  ALBUMIN 3.9    Recent Labs Lab 12/20/15 0130  LIPASE 30   No results for input(s): AMMONIA in the last 168 hours.  Coagulation Profile: No results for input(s): INR, PROTIME in the last 168 hours.  Cardiac Enzymes: No results for input(s): CKTOTAL, CKMB, CKMBINDEX, TROPONINI in the last 168 hours.  BNP (last 3 results) No results for input(s): PROBNP in the last 8760 hours.  HbA1C: No results for input(s): HGBA1C in the last 72 hours.  CBG: No results for input(s): GLUCAP in the last 168 hours.  Lipid Profile: No results for input(s): CHOL, HDL, LDLCALC, TRIG, CHOLHDL, LDLDIRECT  in the last 72 hours.  Thyroid Function Tests: No results for input(s): TSH, T4TOTAL, FREET4, T3FREE, THYROIDAB in the last 72 hours.  Anemia Panel: No results for input(s): VITAMINB12, FOLATE, FERRITIN, TIBC, IRON, RETICCTPCT in the last 72 hours.  Urine analysis:    Component Value Date/Time   COLORURINE YELLOW 12/20/2015 0555   APPEARANCEUR TURBID* 12/20/2015 0555   LABSPEC 1.013 12/20/2015 0555   PHURINE 5.5 12/20/2015 0555   GLUCOSEU NEGATIVE 12/20/2015 0555   HGBUR MODERATE* 12/20/2015 0555   BILIRUBINUR NEGATIVE 12/20/2015 0555   KETONESUR NEGATIVE 12/20/2015 0555   PROTEINUR 30* 12/20/2015 0555   UROBILINOGEN 0.2 03/09/2015 1352   NITRITE  POSITIVE* 12/20/2015 0555   LEUKOCYTESUR LARGE* 12/20/2015 0555    Sepsis Labs: Lactic Acid, Venous    Component Value Date/Time   LATICACIDVEN 0.65 12/20/2015 0556    MICROBIOLOGY: Recent Results (from the past 240 hour(s))  Culture, Urine     Status: Abnormal   Collection Time: 12/20/15  5:55 AM  Result Value Ref Range Status   Specimen Description URINE, CLEAN CATCH  Final   Special Requests NONE  Final   Culture >=100,000 COLONIES/mL ESCHERICHIA COLI (A)  Final   Report Status 12/22/2015 FINAL  Final   Organism ID, Bacteria ESCHERICHIA COLI (A)  Final      Susceptibility   Escherichia coli - MIC*    AMPICILLIN >=32 RESISTANT Resistant     CEFAZOLIN 32 INTERMEDIATE Intermediate     CEFTRIAXONE <=1 SENSITIVE Sensitive     CIPROFLOXACIN 1 SENSITIVE Sensitive     GENTAMICIN <=1 SENSITIVE Sensitive     IMIPENEM <=0.25 SENSITIVE Sensitive     NITROFURANTOIN 32 SENSITIVE Sensitive     TRIMETH/SULFA <=20 SENSITIVE Sensitive     AMPICILLIN/SULBACTAM >=32 RESISTANT Resistant     PIP/TAZO <=4 SENSITIVE Sensitive     * >=100,000 COLONIES/mL ESCHERICHIA COLI  MRSA PCR Screening     Status: None   Collection Time: 12/20/15  6:22 PM  Result Value Ref Range Status   MRSA by PCR NEGATIVE NEGATIVE Final    Comment:        The  GeneXpert MRSA Assay (FDA approved for NASAL specimens only), is one component of a comprehensive MRSA colonization surveillance program. It is not intended to diagnose MRSA infection nor to guide or monitor treatment for MRSA infections.     RADIOLOGY STUDIES/RESULTS: Ct Abdomen Pelvis Wo Contrast  12/20/2015  CLINICAL DATA:  Abdominal pain EXAM: CT ABDOMEN AND PELVIS WITHOUT CONTRAST TECHNIQUE: Multidetector CT imaging of the abdomen and pelvis was performed following the standard protocol without IV contrast. COMPARISON:  03/09/2015 FINDINGS: Lower chest and abdominal wall: Cardiomegaly. Extensive coronary and aortic atherosclerosis. Hepatobiliary: No focal liver abnormality.Cholecystectomy. Pancreas: Chronic cystic change in the pancreatic body without appreciable change compared to prior. This area continues to measure 24 mm in length. Continued stability suggests benign process. Spleen: Unremarkable. Adrenals/Urinary Tract: Negative adrenals.Horseshoe kidney with renal atrophy. No hydronephrosis or stone. Unremarkable bladder. Stomach/Bowel: Distended colon above the distal sigmoid which shows mesenteric and bowel twisting. The twist is relatively loose but still causes transition point. Fluid seen in the rectum. Colonic distention is improved compared to radiography earlier today with distal sigmoid measuring 9 cm in maximal diameter compared at 13 cm previously. Uncomplicated few diverticula. Reproductive:Hysterectomy. Vascular/Lymphatic: No acute vascular abnormality. Extensive atherosclerotic calcification. No mass or adenopathy. Other: Small left pelvic ascites, presumably reactive. Musculoskeletal: No acute finding.  Total left hip arthroplasty. IMPRESSION: 1. Loosely twisted sigmoid volvulus with transition point. Proximal distention is improved compared to radiography earlier today suggesting partial interval decompression. 2. Chronic findings are stable and described above.  Electronically Signed   By: Monte Fantasia M.D.   On: 12/20/2015 06:37   Dg Abd Acute W/chest  12/20/2015  CLINICAL DATA:  Ileus. EXAM: DG ABDOMEN ACUTE W/ 1V CHEST COMPARISON:  [facility] March 11, 2015 and chest x-ray January 05, 2014 FINDINGS: Stable cardiomegaly. No pneumothorax. Mild increased interstitial opacities suggest mild edema. No free air, portal venous gas, or pneumatosis. Again noted are dilated loops of colon without definitive gas in the rectum. Overall, colonic distention has mildly worsened in the interval measuring up  to 12.6 cm today versus 11 cm previously. IMPRESSION: 1. Markedly distended loops of colon without definitive gas in the rectum. Given the similar pattern on previous studies and history, this likely represents chronic colonic ileus. It would be very difficult to exclude a volvulus on this study alone and if there is concern, a CT scan could better evaluate. Electronically Signed   By: Dorise Bullion III M.D   On: 12/20/2015 03:07     LOS: 2 days   [name], MD  [facility] Pager:[phone]  If 7PM-7AM, please contact night-coverage [email or web address] Password [email or web address] 12/22/2015, 1:36 PM

## 2015-12-22 NOTE — Clinical Social Work Note (Addendum)
Clinical Social Work Assessment  Patient Details  Name: Maria Christian MRN: [id number] Date of Birth: 05-02-1925  Date of referral:  12/22/15               Reason for consult:  Facility Placement                Permission sought to share information with:  Facility Sport and exercise psychologist, Tourist information centre manager, Family Supports Permission granted to share information::  Yes, Verbal Permission Granted  Name::        Agency::  SNF  Relationship::  Adult [place]  Contact Information:     Housing/Transportation Living arrangements for the past 2 months:  [place] of Information:  Patient Patient Interpreter Needed:  None Criminal Activity/Legal Involvement Pertinent to Current Situation/Hospitalization:  No - Comment as needed Significant Relationships:  Adult Children Lives with:  Facility Resident Do you feel safe going back to the place where you live?  Yes Need for family participation in patient care:  Yes (Comment)  Care giving concerns:  Patient reports, "I have been living at [facility] for the past two years and I like it honey." Patient reports no concerns with care. She reports that her family is very involved and take good care of her.    Social Worker assessment / plan:  LCSWA met with patient at bedside, patient agreeable to assessment. Patient reports, her son [name] has POA and lives in [place] and daughters  lives in [place] and [place], [place] that are very involved in her care. Patient reports she cannot ambulate very well and uses a wheelchair. Patient she is hopeful return to facility at discharge.  Patient granted LCSWA permission to speak with SNF represetative and family to assist with disposition.  Plan: Assist with patient disposition to SNF.  Employment status:  Retired Forensic scientist:  Medicare PT Recommendations:  [place] / Referral to community resources:  [place]  Patient/Family's  Response to care:  Patient is agreeable to return to SNF at this time.   Patient/Family's Understanding of and Emotional Response to Diagnosis, Current Treatment, and Prognosis:  Patient is well versed in her care. As patient was able to inform LCSWA of her MD. Names and her surguries done. Patient family understand pt. current treatment and prognosis. Patient will continue needed treatment and assistance at [facility].   Emotional Assessment Appearance:  Appears stated age Attitude/Demeanor/Rapport:   (Cooperative) Affect (typically observed):  Accepting, Pleasant, Appropriate Orientation:  Oriented to Self, Oriented to Place, Oriented to  Time, Oriented to Situation Alcohol / Substance use:  Not Applicable Psych involvement (Current and /or in the community):  No (Comment)  Discharge Needs  Concerns to be addressed:  No discharge needs identified Readmission within the last 30 days:  No Current discharge risk:  None Barriers to Discharge:  No Barriers Identified   Lia Hopping, LCSW 12/22/2015, 10:03 AM

## 2015-12-23 DIAGNOSIS — Z79899 Other long term (current) drug therapy: Secondary | ICD-10-CM | POA: Diagnosis not present

## 2015-12-23 DIAGNOSIS — I482 Chronic atrial fibrillation: Secondary | ICD-10-CM | POA: Diagnosis not present

## 2015-12-23 DIAGNOSIS — J449 Chronic obstructive pulmonary disease, unspecified: Secondary | ICD-10-CM | POA: Diagnosis not present

## 2015-12-23 DIAGNOSIS — K264 Chronic or unspecified duodenal ulcer with hemorrhage: Secondary | ICD-10-CM | POA: Diagnosis not present

## 2015-12-23 DIAGNOSIS — M542 Cervicalgia: Secondary | ICD-10-CM | POA: Diagnosis not present

## 2015-12-23 DIAGNOSIS — I2510 Atherosclerotic heart disease of native coronary artery without angina pectoris: Secondary | ICD-10-CM | POA: Diagnosis not present

## 2015-12-23 DIAGNOSIS — B9620 Unspecified Escherichia coli [E. coli] as the cause of diseases classified elsewhere: Secondary | ICD-10-CM | POA: Diagnosis not present

## 2015-12-23 DIAGNOSIS — D638 Anemia in other chronic diseases classified elsewhere: Secondary | ICD-10-CM | POA: Diagnosis not present

## 2015-12-23 DIAGNOSIS — K598 Other specified functional intestinal disorders: Secondary | ICD-10-CM | POA: Diagnosis not present

## 2015-12-23 DIAGNOSIS — K219 Gastro-esophageal reflux disease without esophagitis: Secondary | ICD-10-CM | POA: Diagnosis not present

## 2015-12-23 DIAGNOSIS — J309 Allergic rhinitis, unspecified: Secondary | ICD-10-CM | POA: Diagnosis not present

## 2015-12-23 DIAGNOSIS — E785 Hyperlipidemia, unspecified: Secondary | ICD-10-CM | POA: Diagnosis not present

## 2015-12-23 DIAGNOSIS — L89152 Pressure ulcer of sacral region, stage 2: Secondary | ICD-10-CM | POA: Diagnosis not present

## 2015-12-23 DIAGNOSIS — F411 Generalized anxiety disorder: Secondary | ICD-10-CM | POA: Diagnosis not present

## 2015-12-23 DIAGNOSIS — M545 Low back pain: Secondary | ICD-10-CM | POA: Diagnosis not present

## 2015-12-23 DIAGNOSIS — I10 Essential (primary) hypertension: Secondary | ICD-10-CM

## 2015-12-23 DIAGNOSIS — K5901 Slow transit constipation: Secondary | ICD-10-CM | POA: Diagnosis not present

## 2015-12-23 DIAGNOSIS — E1122 Type 2 diabetes mellitus with diabetic chronic kidney disease: Secondary | ICD-10-CM | POA: Diagnosis not present

## 2015-12-23 DIAGNOSIS — E119 Type 2 diabetes mellitus without complications: Secondary | ICD-10-CM | POA: Diagnosis not present

## 2015-12-23 DIAGNOSIS — F339 Major depressive disorder, recurrent, unspecified: Secondary | ICD-10-CM | POA: Diagnosis not present

## 2015-12-23 DIAGNOSIS — M7989 Other specified soft tissue disorders: Secondary | ICD-10-CM | POA: Diagnosis not present

## 2015-12-23 DIAGNOSIS — E1140 Type 2 diabetes mellitus with diabetic neuropathy, unspecified: Secondary | ICD-10-CM | POA: Diagnosis not present

## 2015-12-23 DIAGNOSIS — N390 Urinary tract infection, site not specified: Secondary | ICD-10-CM | POA: Diagnosis not present

## 2015-12-23 DIAGNOSIS — J301 Allergic rhinitis due to pollen: Secondary | ICD-10-CM | POA: Diagnosis not present

## 2015-12-23 DIAGNOSIS — G20 Parkinson's disease: Secondary | ICD-10-CM | POA: Diagnosis not present

## 2015-12-23 DIAGNOSIS — I5043 Acute on chronic combined systolic (congestive) and diastolic (congestive) heart failure: Secondary | ICD-10-CM

## 2015-12-23 DIAGNOSIS — B3749 Other urogenital candidiasis: Secondary | ICD-10-CM | POA: Diagnosis not present

## 2015-12-23 DIAGNOSIS — Z8673 Personal history of transient ischemic attack (TIA), and cerebral infarction without residual deficits: Secondary | ICD-10-CM | POA: Diagnosis not present

## 2015-12-23 DIAGNOSIS — F0391 Unspecified dementia with behavioral disturbance: Secondary | ICD-10-CM | POA: Diagnosis not present

## 2015-12-23 DIAGNOSIS — I130 Hypertensive heart and chronic kidney disease with heart failure and stage 1 through stage 4 chronic kidney disease, or unspecified chronic kidney disease: Secondary | ICD-10-CM | POA: Diagnosis not present

## 2015-12-23 DIAGNOSIS — N183 Chronic kidney disease, stage 3 (moderate): Secondary | ICD-10-CM | POA: Diagnosis not present

## 2015-12-23 DIAGNOSIS — G4700 Insomnia, unspecified: Secondary | ICD-10-CM | POA: Diagnosis not present

## 2015-12-23 DIAGNOSIS — G8929 Other chronic pain: Secondary | ICD-10-CM | POA: Diagnosis not present

## 2015-12-23 DIAGNOSIS — R197 Diarrhea, unspecified: Secondary | ICD-10-CM | POA: Diagnosis not present

## 2015-12-23 DIAGNOSIS — F0390 Unspecified dementia without behavioral disturbance: Secondary | ICD-10-CM | POA: Diagnosis not present

## 2015-12-23 DIAGNOSIS — B351 Tinea unguium: Secondary | ICD-10-CM | POA: Diagnosis not present

## 2015-12-23 DIAGNOSIS — N270 Small kidney, unilateral: Secondary | ICD-10-CM | POA: Diagnosis not present

## 2015-12-23 DIAGNOSIS — I5041 Acute combined systolic (congestive) and diastolic (congestive) heart failure: Secondary | ICD-10-CM | POA: Diagnosis not present

## 2015-12-23 DIAGNOSIS — K562 Volvulus: Secondary | ICD-10-CM | POA: Diagnosis not present

## 2015-12-23 DIAGNOSIS — M179 Osteoarthritis of knee, unspecified: Secondary | ICD-10-CM | POA: Diagnosis not present

## 2015-12-23 DIAGNOSIS — K567 Ileus, unspecified: Secondary | ICD-10-CM | POA: Diagnosis not present

## 2015-12-23 DIAGNOSIS — J9610 Chronic respiratory failure, unspecified whether with hypoxia or hypercapnia: Secondary | ICD-10-CM | POA: Diagnosis not present

## 2015-12-23 DIAGNOSIS — M79672 Pain in left foot: Secondary | ICD-10-CM | POA: Diagnosis not present

## 2015-12-23 DIAGNOSIS — I110 Hypertensive heart disease with heart failure: Secondary | ICD-10-CM | POA: Diagnosis not present

## 2015-12-23 DIAGNOSIS — D599 Acquired hemolytic anemia, unspecified: Secondary | ICD-10-CM | POA: Diagnosis not present

## 2015-12-23 DIAGNOSIS — Z96649 Presence of unspecified artificial hip joint: Secondary | ICD-10-CM | POA: Diagnosis not present

## 2015-12-23 DIAGNOSIS — M6281 Muscle weakness (generalized): Secondary | ICD-10-CM | POA: Diagnosis not present

## 2015-12-23 DIAGNOSIS — K260 Acute duodenal ulcer with hemorrhage: Secondary | ICD-10-CM | POA: Diagnosis not present

## 2015-12-23 DIAGNOSIS — J9611 Chronic respiratory failure with hypoxia: Secondary | ICD-10-CM | POA: Diagnosis not present

## 2015-12-23 DIAGNOSIS — I4891 Unspecified atrial fibrillation: Secondary | ICD-10-CM | POA: Diagnosis not present

## 2015-12-23 DIAGNOSIS — H04123 Dry eye syndrome of bilateral lacrimal glands: Secondary | ICD-10-CM | POA: Diagnosis not present

## 2015-12-23 DIAGNOSIS — M150 Primary generalized (osteo)arthritis: Secondary | ICD-10-CM | POA: Diagnosis not present

## 2015-12-23 DIAGNOSIS — H01009 Unspecified blepharitis unspecified eye, unspecified eyelid: Secondary | ICD-10-CM | POA: Diagnosis not present

## 2015-12-23 DIAGNOSIS — I25110 Atherosclerotic heart disease of native coronary artery with unstable angina pectoris: Secondary | ICD-10-CM | POA: Diagnosis not present

## 2015-12-23 DIAGNOSIS — R100 Acute abdomen: Secondary | ICD-10-CM | POA: Diagnosis not present

## 2015-12-23 DIAGNOSIS — D696 Thrombocytopenia, unspecified: Secondary | ICD-10-CM | POA: Diagnosis not present

## 2015-12-23 DIAGNOSIS — R319 Hematuria, unspecified: Secondary | ICD-10-CM | POA: Diagnosis not present

## 2015-12-23 DIAGNOSIS — D509 Iron deficiency anemia, unspecified: Secondary | ICD-10-CM | POA: Diagnosis not present

## 2015-12-23 DIAGNOSIS — I5032 Chronic diastolic (congestive) heart failure: Secondary | ICD-10-CM | POA: Diagnosis not present

## 2015-12-23 DIAGNOSIS — M79671 Pain in right foot: Secondary | ICD-10-CM | POA: Diagnosis not present

## 2015-12-23 DIAGNOSIS — Z7982 Long term (current) use of aspirin: Secondary | ICD-10-CM | POA: Diagnosis not present

## 2015-12-23 DIAGNOSIS — Z87891 Personal history of nicotine dependence: Secondary | ICD-10-CM | POA: Diagnosis not present

## 2015-12-23 DIAGNOSIS — H401134 Primary open-angle glaucoma, bilateral, indeterminate stage: Secondary | ICD-10-CM | POA: Diagnosis not present

## 2015-12-23 DIAGNOSIS — N179 Acute kidney failure, unspecified: Secondary | ICD-10-CM | POA: Diagnosis not present

## 2015-12-23 DIAGNOSIS — R109 Unspecified abdominal pain: Secondary | ICD-10-CM | POA: Diagnosis present

## 2015-12-23 DIAGNOSIS — E1142 Type 2 diabetes mellitus with diabetic polyneuropathy: Secondary | ICD-10-CM | POA: Diagnosis not present

## 2015-12-23 DIAGNOSIS — N289 Disorder of kidney and ureter, unspecified: Secondary | ICD-10-CM | POA: Diagnosis not present

## 2015-12-23 NOTE — Care Management Important Message (Signed)
Important Message  Patient Details IM Letter given to Suzanne/Case Manager to present to Patient Name: CRISSY ANTILL MRN: [id number] Date of Birth: [birth year]   Medicare Important Message Given:  Yes    Camillo Flaming 12/23/2015, 11:02 AMImportant Message  Patient Details  Name: PRECILLA ASENCIO MRN: [id number] Date of Birth: Oct 07, 1924   Medicare Important Message Given:  Yes    Camillo Flaming 12/23/2015, 11:02 AM

## 2015-12-23 NOTE — Progress Notes (Signed)
Report called to Bethena Roys, LPN at [facility] facility.  Patient left hospital via EMS.

## 2015-12-23 NOTE — Discharge Summary (Signed)
PATIENT DETAILS Name: Maria Christian Age: 82 y.o. Sex: female Date of Birth: [birth year] MRN: [id number]. Admitting Physician: Florencia Reasons, MD [id number], Noah Delaine, MD  Admit Date: 12/20/2015 Discharge date: 12/23/2015  Recommendations for Outpatient Follow-up:  1. Consider evaluation by palliative care while at SNF. 2. Please repeat CBC/BMET 1 week  PRIMARY DISCHARGE DIAGNOSIS:  Active Problems:   Sigmoid volvulus (HCC)      PAST MEDICAL HISTORY: Past Medical History  Diagnosis Date  . Atrial fibrillation ([place])   . Altered mental status   . Encephalopathy   . Parkinson disease ([place])   . Hypertension   . GERD (gastroesophageal reflux disease)   . Hyperlipemia   . Diabetes mellitus   . Coronary artery disease   . History of adenomatous polyp of colon 06/24/99  . Enlarged heart   . DEMENTIA   . Edema of lower extremity 07/13/11    right leg more swollen than left leg  . Esophageal dysmotility 07/02/12  . Horseshoe kidney   . Pancreatic lesion 05/22/11    no further workup  per PCP/family due to age  . Anemia   . Peripheral neuropathy ([place])   . Depression   . Thrombophlebitis   . TIA (transient ischemic attack) 12/19/2012  . Sigmoid volvulus ([place]) 05/24/2013  . Constipation 02/27/2015    DISCHARGE MEDICATIONS: Current Discharge Medication List    CONTINUE these medications which have NOT CHANGED   Details  alum & mag hydroxide-simeth (MAALOX/MYLANTA) 200-200-20 MG/5ML suspension Take 15 mLs by mouth once.    amLODipine (NORVASC) 2.5 MG tablet Take 2.5 mg by mouth every morning. Hold for BP < 100/60 or HR < 60    aspirin EC 81 MG tablet Take 81 mg by mouth daily.    baclofen (LIORESAL) 10 MG tablet Take 5 mg by mouth at bedtime.    Biotin 5 MG/ML LIQD Take 5 mLs by mouth 2 (two) times daily.     carvedilol (COREG) 3.125 MG tablet Take 3.125 mg by mouth daily.    cholecalciferol (VITAMIN D) 1000 UNITS tablet Take 1,000 Units by mouth every morning.       clotrimazole (LOTRIMIN) 1 % cream Apply 1 application topically 2 (two) times daily. Apply to buttocks , labia topically every shift for rash then apply to buttocks, labia in the evening for rash.    Cranberry 475 MG CAPS Take 1 capsule by mouth 2 (two) times daily.     docusate sodium (COLACE) 100 MG capsule Take 200 mg by mouth at bedtime.     dorzolamide (TRUSOPT) 2 % ophthalmic solution Place 1 drop into both eyes 3 (three) times daily. Reported on 11/16/2015    escitalopram (LEXAPRO) 10 MG tablet Take 1 tablet (10 mg total) by mouth daily. Qty: 90 tablet, Refills: 11   Associated Diagnoses: Depression    ferrous sulfate 325 (65 FE) MG tablet Take 325 mg by mouth daily with breakfast.    fluticasone (FLONASE) 50 MCG/ACT nasal spray Place 2 sprays into both nostrils at bedtime.    furosemide (LASIX) 40 MG tablet Take 1 tablet (40 mg total) by mouth daily. Qty: 30 tablet    gabapentin (NEURONTIN) 100 MG capsule Take 100 mg by mouth at bedtime.    lidocaine (LIDODERM) 5 % Place 1 patch onto the skin daily. Apply 1 patch to left lower back/hip every morning.  Remove & Discard patch within 12 hours or as directed by MD Qty: 5 patch, Refills: 0    Linaclotide (  LINZESS) 145 MCG CAPS capsule Take 145 mcg by mouth daily before breakfast.     loratadine (CLARITIN) 10 MG tablet Take 10 mg by mouth daily before breakfast.     Multiple Vitamins-Minerals (CERTAGEN PO) Take 1 tablet by mouth daily.     Nutritional Supplements (TWOCAL HN) LIQD Take 90 mLs by mouth 3 (three) times daily.    ondansetron (ZOFRAN) 8 MG tablet Take 8 mg by mouth every 8 (eight) hours as needed for nausea or vomiting.    OXYGEN Inhale 2 L into the lungs continuous.    polyethylene glycol (MIRALAX / GLYCOLAX) packet Take 17 g by mouth daily at 6 (six) AM.     potassium chloride SA (K-DUR,KLOR-CON) 20 MEQ tablet Take 20 mEq by mouth daily with breakfast.     PROTONIX 40 MG PACK Take 40 mg by mouth daily at 6  (six) AM.    senna (SENOKOT) 8.6 MG TABS tablet Take 1 tablet (8.6 mg total) by mouth 2 (two) times daily. Qty: 120 each, Refills: 0    Skin Protectants, Misc. (CALAZIME SKIN PROTECTANT EX) Apply 1 application topically as directed. Cleanse buttocks and apply paste daily and as needed every day and night shift for protection.    sodium chloride (OCEAN) 0.65 % SOLN nasal spray Place 1 spray into both nostrils as directed. Four times daily And every 24 hours as needed to moisturize nasal passages.    Travoprost, BAK Free, (TRAVATAN) 0.004 % SOLN ophthalmic solution Place 1 drop into both eyes daily at 8 pm.     vitamin B-12 (CYANOCOBALAMIN) 500 MCG tablet Take 500 mcg by mouth every morning.     acetaminophen (TYLENOL) 325 MG tablet Take 2 tablets (650 mg total) by mouth every 6 (six) hours as needed for mild pain. Qty: 120 tablet, Refills: 11   Associated Diagnoses: Chronic pain        ALLERGIES:   Allergies  Allergen Reactions  . Aspirin Other (See Comments)    G.I. Upset only    BRIEF HPI:  See H&P, Labs, Consult and Test reports for all details in brief,  Patient is a 82 y.o. female SNF resident-normal LEEP bed to wheelchair bound, with history of dementia, Parkinson's disease, atrial fibrillation not on anticoagulation, recurrent volvulus-admitted to the hospital with abdominal pain and distention, found to have volvulus.  CONSULTATIONS:   GI and general surgery  PERTINENT RADIOLOGIC STUDIES: Ct Abdomen Pelvis Wo Contrast  12/20/2015  CLINICAL DATA:  Abdominal pain EXAM: CT ABDOMEN AND PELVIS WITHOUT CONTRAST TECHNIQUE: Multidetector CT imaging of the abdomen and pelvis was performed following the standard protocol without IV contrast. COMPARISON:  03/09/2015 FINDINGS: Lower chest and abdominal wall: Cardiomegaly. Extensive coronary and aortic atherosclerosis. Hepatobiliary: No focal liver abnormality.Cholecystectomy. Pancreas: Chronic cystic change in the pancreatic body  without appreciable change compared to prior. This area continues to measure 24 mm in length. Continued stability suggests benign process. Spleen: Unremarkable. Adrenals/Urinary Tract: Negative adrenals.Horseshoe kidney with renal atrophy. No hydronephrosis or stone. Unremarkable bladder. Stomach/Bowel: Distended colon above the distal sigmoid which shows mesenteric and bowel twisting. The twist is relatively loose but still causes transition point. Fluid seen in the rectum. Colonic distention is improved compared to radiography earlier today with distal sigmoid measuring 9 cm in maximal diameter compared at 13 cm previously. Uncomplicated few diverticula. Reproductive:Hysterectomy. Vascular/Lymphatic: No acute vascular abnormality. Extensive atherosclerotic calcification. No mass or adenopathy. Other: Small left pelvic ascites, presumably reactive. Musculoskeletal: No acute finding.  Total left hip arthroplasty.  IMPRESSION: 1. Loosely twisted sigmoid volvulus with transition point. Proximal distention is improved compared to radiography earlier today suggesting partial interval decompression. 2. Chronic findings are stable and described above. Electronically Signed   By: Monte Fantasia M.D.   On: 12/20/2015 06:37   Dg Abd Acute W/chest  12/20/2015  CLINICAL DATA:  Ileus. EXAM: DG ABDOMEN ACUTE W/ 1V CHEST COMPARISON:  [facility] March 11, 2015 and chest x-ray January 05, 2014 FINDINGS: Stable cardiomegaly. No pneumothorax. Mild increased interstitial opacities suggest mild edema. No free air, portal venous gas, or pneumatosis. Again noted are dilated loops of colon without definitive gas in the rectum. Overall, colonic distention has mildly worsened in the interval measuring up to 12.6 cm today versus 11 cm previously. IMPRESSION: 1. Markedly distended loops of colon without definitive gas in the rectum. Given the similar pattern on previous studies and history, this likely represents chronic colonic ileus. It would be  very difficult to exclude a volvulus on this study alone and if there is concern, a CT scan could better evaluate. Electronically Signed   By: Dorise Bullion III M.D   On: 12/20/2015 03:07     PERTINENT LAB RESULTS: CBC:  Recent Labs  12/21/15 0519 12/22/15 0543  WBC 3.9* 5.1  HGB 9.8* 10.8*  HCT 29.7* 32.3*  PLT 98* 101*   CMET CMP     Component Value Date/Time   NA 142 12/22/2015 0543   NA 143 10/14/2015   K 3.9 12/22/2015 0543   CL 117* 12/22/2015 0543   CO2 20* 12/22/2015 0543   GLUCOSE 125* 12/22/2015 0543   BUN 18 12/22/2015 0543   BUN 16 10/14/2015   CREATININE 1.09* 12/22/2015 0543   CREATININE 1.3* 10/14/2015   CALCIUM 8.7* 12/22/2015 0543   PROT 7.1 12/20/2015 0130   ALBUMIN 3.9 12/20/2015 0130   AST 22 12/20/2015 0130   ALT 13* 12/20/2015 0130   ALKPHOS 95 12/20/2015 0130   BILITOT 0.8 12/20/2015 0130   GFRNONAA 44* 12/22/2015 0543   GFRAA 51* 12/22/2015 0543    GFR Estimated Creatinine Clearance: 34 mL/min (by C-G formula based on Cr of 1.09). No results for input(s): LIPASE, AMYLASE in the last 72 hours. No results for input(s): CKTOTAL, CKMB, CKMBINDEX, TROPONINI in the last 72 hours. Invalid input(s): POCBNP No results for input(s): DDIMER in the last 72 hours. No results for input(s): HGBA1C in the last 72 hours. No results for input(s): CHOL, HDL, LDLCALC, TRIG, CHOLHDL, LDLDIRECT in the last 72 hours. No results for input(s): TSH, T4TOTAL, T3FREE, THYROIDAB in the last 72 hours.  Invalid input(s): FREET3 No results for input(s): VITAMINB12, FOLATE, FERRITIN, TIBC, IRON, RETICCTPCT in the last 72 hours. Coags: No results for input(s): INR in the last 72 hours.  Invalid input(s): PT Microbiology: Recent Results (from the past 240 hour(s))  Culture, Urine     Status: Abnormal   Collection Time: 12/20/15  5:55 AM  Result Value Ref Range Status   Specimen Description URINE, CLEAN CATCH  Final   Special Requests NONE  Final   Culture  >=100,000 COLONIES/mL ESCHERICHIA COLI (A)  Final   Report Status 12/22/2015 FINAL  Final   Organism ID, Bacteria ESCHERICHIA COLI (A)  Final      Susceptibility   Escherichia coli - MIC*    AMPICILLIN >=32 RESISTANT Resistant     CEFAZOLIN 32 INTERMEDIATE Intermediate     CEFTRIAXONE <=1 SENSITIVE Sensitive     CIPROFLOXACIN 1 SENSITIVE Sensitive     GENTAMICIN <=  1 SENSITIVE Sensitive     IMIPENEM <=0.25 SENSITIVE Sensitive     NITROFURANTOIN 32 SENSITIVE Sensitive     TRIMETH/SULFA <=20 SENSITIVE Sensitive     AMPICILLIN/SULBACTAM >=32 RESISTANT Resistant     PIP/TAZO <=4 SENSITIVE Sensitive     * >=100,000 COLONIES/mL ESCHERICHIA COLI  MRSA PCR Screening     Status: None   Collection Time: 12/20/15  6:22 PM  Result Value Ref Range Status   MRSA by PCR NEGATIVE NEGATIVE Final    Comment:        The GeneXpert MRSA Assay (FDA approved for NASAL specimens only), is one component of a comprehensive MRSA colonization surveillance program. It is not intended to diagnose MRSA infection nor to guide or monitor treatment for MRSA infections.      BRIEF HOSPITAL COURSE:  Sigmoid volvulus: No further abdominal distention, underwent decompression by flexible sigmoidoscopy on 7/17. Tolerating diet. GI and general surgery were consulted during this hospital stay. Per general surgery with no plans for any surgical repair at this time. Continue to follow for reoccurrence.  Diarrhea: Seems to have resolved, although started on empiric vancomycin-stool studies never sent as diarrhea resolved. Vancomycin was discontinued on 7/19, when necessary no further diarrhea  ? UTI: I'm not sure whether she is symptomatic-does occasionally acknowledge dysuria-urine culture positive for pansensitive Escherichia coli. She was given 3 doses of intravenous Rocephin-no further antibiotic required on discharge.  Acute kidney injury: Likely mild prerenal azotemia, resolved with IV fluids.   Chronic hypoxemic  respiratory failure: Continue home O2.  Chronic diastolic heart failure: Clinically compensated, continue Coreg and Lasix  GERD: PPI  History of chronic atrial fibrillation: Continue Coreg for rate control, chads 2 vascular for at least 4-suspect not on anticoagulation due to advanced age, frailty and high risk for bleeding.  History of Parkinson's disease/dementia: Appears stable. Will resume her usual medications on discharge  Type 2 diabetes: Diet controlled.   TODAY-DAY OF DISCHARGE:  Subjective:   [name] today has No abdominal distention or diarrhea. She is tolerating diet.   Objective:   Blood pressure 145/79, pulse 72, temperature 98.5 F (36.9 C), temperature source Oral, resp. rate 16, height 5\' 7"  (1.702 m), weight 68.04 kg (150 lb), SpO2 97 %.  Intake/Output Summary (Last 24 hours) at 12/23/15 1154 Last data filed at 12/23/15 1005  Gross per 24 hour  Intake    300 ml  Output      0 ml  Net    300 ml   Filed Weights   12/20/15 0755  Weight: 68.04 kg (150 lb)    Exam Awake Alert, Oriented *3, No new F.N deficits, Normal affect [place].AT,PERRAL Supple Neck,No JVD, No cervical lymphadenopathy appriciated.  Symmetrical Chest wall movement, Good air movement bilaterally, CTAB RRR,No Gallops,Rubs or new Murmurs, No Parasternal Heave +ve B.Sounds, Abd Soft, Non tender, No organomegaly appriciated, No rebound -guarding or rigidity. No Cyanosis, Clubbing or edema, No new Rash or bruise  DISCHARGE CONDITION: Stable  DISPOSITION: SNF  DISCHARGE INSTRUCTIONS:    Activity:  As tolerated with Full fall precautions use walker/cane & assistance as needed  Get Medicines reviewed and adjusted: Please take all your medications with you for your next visit with your Primary MD  Please request your Primary MD to go over all hospital tests and procedure/radiological results at the follow up, please ask your Primary MD to get all Hospital records sent to his/her  office.  If you experience worsening of your admission symptoms, develop shortness  of breath, life threatening emergency, suicidal or homicidal thoughts you must seek medical attention immediately by calling 911 or calling your MD immediately  if symptoms less severe.  You must read complete instructions/literature along with all the possible adverse reactions/side effects for all the Medicines you take and that have been prescribed to you. Take any new Medicines after you have completely understood and accpet all the possible adverse reactions/side effects.   Do not drive when taking Pain medications.   Do not take more than prescribed Pain, Sleep and Anxiety Medications  Special Instructions: If you have smoked or chewed Tobacco  in the last 2 yrs please stop smoking, stop any regular Alcohol  and or any Recreational drug use.  Wear Seat belts while driving.  Please note  You were cared for by a hospitalist during your hospital stay. Once you are discharged, your primary care physician will handle any further medical issues. Please note that NO REFILLS for any discharge medications will be authorized once you are discharged, as it is imperative that you return to your primary care physician (or establish a relationship with a primary care physician if you do not have one) for your aftercare needs so that they can reassess your need for medications and monitor your lab values.   Diet recommendation: Diabetic Diet Heart Healthy diet  Discharge Instructions    Call MD for:  persistant nausea and vomiting    Complete by:  As directed      Call MD for:  severe uncontrolled pain    Complete by:  As directed      Diet - low sodium heart healthy    Complete by:  As directed      Increase activity slowly    Complete by:  As directed            Follow-up Information    Follow up with [facility].   Specialty:  [place] information:     [address] [phone]      Follow up with Hennie Duos, MD. Schedule an appointment as soon as possible for a visit in 1 week.   Specialty:  Internal Medicine   Why:  Hospital follow up   Contact information:   [place] [id number] [phone]       Total Time spent on discharge equals  45 minutes.  SignedOren Binet 12/23/2015 11:54 AM

## 2015-12-23 NOTE — Clinical Social Work Placement (Signed)
   CLINICAL SOCIAL WORK PLACEMENT  NOTE  Date:  12/23/2015  Patient Details  Name: Maria Christian MRN: [id number] Date of Birth: 01/16/1925  Clinical Social Work is seeking post-discharge placement for this patient at the [place] level of care (*CSW will initial, date and re-position this form in  chart as items are completed):  No   Patient/family provided with [facility] Work Department's list of facilities offering this level of care within the geographic area requested by the patient (or if unable, by the patient's family).  Yes   Patient/family informed of their freedom to choose among providers that offer the needed level of care, that participate in Medicare, Medicaid or managed care program needed by the patient, have an available bed and are willing to accept the patient.  Yes   Patient/family informed of 's ownership interest in [facility] and [facility], as well as of the fact that they are under no obligation to receive care at these facilities.  PASRR submitted to EDS on       PASRR number received on       Existing PASRR number confirmed on 12/23/15     FL2 transmitted to all facilities in geographic area requested by pt/family on       FL2 transmitted to all facilities within larger geographic area on       Patient informed that his/her managed care company has contracts with or will negotiate with certain facilities, including the following:            Patient/family informed of bed offers received.  Patient chooses bed at [facility]     Physician recommends and patient chooses bed at      Patient to be transferred to [facility] on 12/23/15.  Patient to be transferred to facility by [facility]     Patient family notified on 12/23/15 of transfer.  Name of family member notified:  Jerolyn Center [phone]     PHYSICIAN Please prepare priority discharge summary,  including medications     Additional Comment:   Patient is returning to [facility]  _______________________________________________ Lia Hopping, LCSW 12/23/2015, 11:34 AM

## 2015-12-24 ENCOUNTER — Non-Acute Institutional Stay (SKILLED_NURSING_FACILITY): Payer: Medicare Other | Admitting: Adult Health

## 2015-12-24 ENCOUNTER — Encounter: Payer: Self-pay | Admitting: Adult Health

## 2015-12-24 DIAGNOSIS — I5032 Chronic diastolic (congestive) heart failure: Secondary | ICD-10-CM | POA: Diagnosis not present

## 2015-12-24 DIAGNOSIS — I482 Chronic atrial fibrillation, unspecified: Secondary | ICD-10-CM

## 2015-12-24 DIAGNOSIS — J301 Allergic rhinitis due to pollen: Secondary | ICD-10-CM | POA: Diagnosis not present

## 2015-12-24 DIAGNOSIS — D638 Anemia in other chronic diseases classified elsewhere: Secondary | ICD-10-CM

## 2015-12-24 DIAGNOSIS — J9610 Chronic respiratory failure, unspecified whether with hypoxia or hypercapnia: Secondary | ICD-10-CM

## 2015-12-24 DIAGNOSIS — G8929 Other chronic pain: Secondary | ICD-10-CM

## 2015-12-24 DIAGNOSIS — M150 Primary generalized (osteo)arthritis: Secondary | ICD-10-CM | POA: Diagnosis not present

## 2015-12-24 DIAGNOSIS — K562 Volvulus: Secondary | ICD-10-CM

## 2015-12-24 DIAGNOSIS — K5901 Slow transit constipation: Secondary | ICD-10-CM

## 2015-12-24 DIAGNOSIS — M545 Low back pain, unspecified: Secondary | ICD-10-CM

## 2015-12-24 DIAGNOSIS — M159 Polyosteoarthritis, unspecified: Secondary | ICD-10-CM

## 2015-12-24 DIAGNOSIS — N183 Chronic kidney disease, stage 3 unspecified: Secondary | ICD-10-CM

## 2015-12-24 DIAGNOSIS — I110 Hypertensive heart disease with heart failure: Secondary | ICD-10-CM

## 2015-12-24 NOTE — Progress Notes (Signed)
Patient ID: Maria Christian, female   DOB: [birth year], 82 y.o.   MRN: [id number]   Location:   [facility] Room Number: 220-A Place of Service:  SNF (31)   CODE STATUS: DNR  Allergies  Allergen Reactions  . Aspirin Other (See Comments)    [name]    Chief Complaint  Patient presents with  . Hospitalization Follow-up     HPI:  She is a long term resident of this facility who has been hospitalized for sigmoid volvulus and uti. She tells me that her stomach is sore and tender; but that she is feeling better. Her appetite is good. There are no nursing concerns at this time.    Past Medical History  Diagnosis Date  . Atrial fibrillation ([place])   . Altered mental status   . Encephalopathy   . Parkinson disease ([place])   . Hypertension   . GERD (gastroesophageal reflux disease)   . Hyperlipemia   . Diabetes mellitus   . Coronary artery disease   . History of adenomatous polyp of colon 06/24/99  . Enlarged heart   . DEMENTIA   . Edema of lower extremity 07/13/11    right leg more swollen than left leg  . Esophageal dysmotility 07/02/12  . Horseshoe kidney   . Pancreatic lesion 05/22/11    no further workup  per PCP/family due to age  . Anemia   . Peripheral neuropathy ([place])   . Depression   . Thrombophlebitis   . TIA (transient ischemic attack) 12/19/2012  . Sigmoid volvulus ([place]) 05/24/2013  . Constipation 02/27/2015    Past Surgical History  Procedure Laterality Date  . Shoulder surgery  2001    left clavicle excision and acromioplasty  . Abdominal hysterectomy    . Cholecystectomy    . Total hip arthroplasty    . Breast surgery      2 benign tumors removed left breast  . Foot surgery      benign tumors from foot  . Esophageal dilation      several times by Dr. [name]  . Nose surgery    . Esophagogastroduodenoscopy (egd) with esophageal dilation N/A 08/01/2012    Procedure: ESOPHAGOGASTRODUODENOSCOPY (EGD) WITH ESOPHAGEAL DILATION;  Surgeon:  [name], MD;  Location: [facility];  Service: Endoscopy;  Laterality: N/A;  with c-arm savory dilators  . Flexible sigmoidoscopy N/A 05/24/2013    Procedure: FLEXIBLE SIGMOIDOSCOPY;  Surgeon: [name], MD;  Location: [facility];  Service: Endoscopy;  Laterality: N/A;  . Flexible sigmoidoscopy N/A 05/26/2013    Procedure:  flex with decompression of sigmoid volvulus;  Surgeon: [name], MD;  Location: [facility];  Service: Endoscopy;  Laterality: N/A;  . Esophagogastroduodenoscopy (egd) with propofol N/A 03/12/2015    Procedure: ESOPHAGOGASTRODUODENOSCOPY (EGD) WITH PROPOFOL;  Surgeon: [name], MD;  Location: [facility];  Service: Endoscopy;  Laterality: N/A;  . Flexible sigmoidoscopy N/A 12/20/2015    Procedure: FLEXIBLE SIGMOIDOSCOPY;  Surgeon: Milus Banister, MD;  Location: [facility];  Service: Endoscopy;  Laterality: N/A;    Social History   Social History  . Marital Status: Widowed    Spouse Name: N/A  . Number of Children: 5  . Years of Education: 10th   Occupational History  . Retired    Social History Main Topics  . Smoking status: Former Smoker    Quit date: 10/09/1964  . Smokeless tobacco: Never Used  . Alcohol Use: No  . Drug Use: No  .  Sexual Activity: No   Other Topics Concern  . Not on file   Social History Narrative   Pt lives at [facility] and [place].   Caffeine Use: very small amount daily   Family History  Problem Relation Age of Onset  . Cancer    . Heart disease        VITAL SIGNS BP 137/91 mmHg  Pulse 72  Temp(Src) 98.7 F (37.1 C) (Oral)  Resp 18  Ht 5\' 7"  (1.702 m)  Wt 146 lb (66.225 kg)  BMI 22.86 kg/m2  SpO2 97%  Patient's Medications  New Prescriptions   No medications on file  Previous Medications   ACETAMINOPHEN (TYLENOL) 325 MG TABLET    Take 2 tablets (650 mg total) by mouth every 6 (six) hours as needed for mild pain.   ALUM & MAG HYDROXIDE-SIMETH (MAALOX/MYLANTA) [id number] MG/5ML  SUSPENSION    Take 15 mLs by mouth once.   AMLODIPINE (NORVASC) 2.5 MG TABLET    Take 2.5 mg by mouth every morning. Hold for BP < 100/60 or HR < 60   ASPIRIN EC 81 MG TABLET    Take 81 mg by mouth daily.   BACLOFEN (LIORESAL) 10 MG TABLET    Take 5 mg by mouth at bedtime.   BIOTIN 5 MG/ML LIQD    Take 5 mLs by mouth 2 (two) times daily.    CARVEDILOL (COREG) 3.125 MG TABLET    Take 3.125 mg by mouth daily.   CHOLECALCIFEROL (VITAMIN D) 1000 UNITS TABLET    Take 1,000 Units by mouth every morning.    CLOTRIMAZOLE (LOTRIMIN) 1 % CREAM    Apply 1 application topically 2 (two) times daily. Apply to buttocks , labia topically every shift for rash then apply to buttocks, labia in the evening for rash.   CRANBERRY 475 MG CAPS    Take 1 capsule by mouth 2 (two) times daily.    DOCUSATE SODIUM (COLACE) 100 MG CAPSULE    Take 200 mg by mouth at bedtime.    DORZOLAMIDE (TRUSOPT) 2 % OPHTHALMIC SOLUTION    Place 1 drop into both eyes 3 (three) times daily. Reported on 11/16/2015   ESCITALOPRAM (LEXAPRO) 10 MG TABLET    Take 1 tablet (10 mg total) by mouth daily.   FERROUS SULFATE 325 (65 FE) MG TABLET    Take 325 mg by mouth daily with breakfast.   FLUTICASONE (FLONASE) 50 MCG/ACT NASAL SPRAY    Place 2 sprays into both nostrils at bedtime.   FUROSEMIDE (LASIX) 40 MG TABLET    Take 1 tablet (40 mg total) by mouth daily.   GABAPENTIN (NEURONTIN) 100 MG CAPSULE    Take 100 mg by mouth at bedtime.   LIDOCAINE (LIDODERM) 5 %    Place 1 patch onto the skin daily. Apply 1 patch to left lower back/hip every morning.  Remove & Discard patch within 12 hours or as directed by MD   LINACLOTIDE (LINZESS) 145 MCG CAPS CAPSULE    Take 145 mcg by mouth daily before breakfast.    LORATADINE (CLARITIN) 10 MG TABLET    Take 10 mg by mouth daily before breakfast.    MULTIPLE VITAMINS-MINERALS (CERTAGEN PO)    Take 1 tablet by mouth daily.    NUTRITIONAL SUPPLEMENTS (TWOCAL HN) LIQD    Take 90 mLs by mouth 3 (three) times  daily.   ONDANSETRON (ZOFRAN) 8 MG TABLET    Take 8 mg by mouth every 8 (eight) hours  as needed for nausea or vomiting.   OXYGEN    Inhale 2 L into the lungs continuous.   POLYETHYLENE GLYCOL (MIRALAX / GLYCOLAX) PACKET    Take 17 g by mouth daily at 6 (six) AM.    POTASSIUM CHLORIDE SA (K-DUR,KLOR-CON) 20 MEQ TABLET    Take 20 mEq by mouth daily with breakfast.    PROTONIX 40 MG PACK    Take 40 mg by mouth daily at 6 (six) AM.   SENNA (SENOKOT) 8.6 MG TABS TABLET    Take 1 tablet (8.6 mg total) by mouth 2 (two) times daily.   SKIN PROTECTANTS, MISC. (CALAZIME SKIN PROTECTANT EX)    Apply 1 application topically as directed. Cleanse buttocks and apply paste daily and as needed every day and night shift for protection.   SODIUM CHLORIDE (OCEAN) 0.65 % SOLN NASAL SPRAY    Place 1 spray into both nostrils as directed. Four times daily And every 24 hours as needed to moisturize nasal passages.   TRAVOPROST, BAK FREE, (TRAVATAN) 0.004 % SOLN OPHTHALMIC SOLUTION    Place 1 drop into both eyes daily at 8 pm.    VITAMIN B-12 (CYANOCOBALAMIN) 500 MCG TABLET    Take 500 mcg by mouth every morning.   Modified Medications   No medications on file  Discontinued Medications   No medications on file     SIGNIFICANT DIAGNOSTIC EXAMS 12-21-14: chest x-ray: no acute cardiopulmonary process   01-02-15: left hand x-ray: moderate osteoarthritis   02-15-15: abdominal ultrasound: 1. Gallbladder not visualized. 2. No hydronephrosis no renal calculus; 3. The pancreas not well visualized; which could be related to gassy abdomen but an appearance which might represent pancreatitis in the appropriate clinical setting.   02-26-15: right lower extremity doppler: - No evidence of deep vein thrombosis involving the right lower   extremity. - No evidence of Baker&'s cyst on the right.  03-12-15: upper endoscopy: duodenal bulb 1.5-2.0 cm ulcer with clot at base. No active bleeding. Surrounding mucosa was edematous. Biopsy of  the surrounding mucosa were done.   05-11-15: right ankle x-ray: normal   12-19-15: kub: moderate colonic ileus   12-20-15: kub: 1. Markedly distended loops of colon without definitive gas in the rectum. Given the similar pattern on previous studies and history, this likely represents chronic colonic ileus. It would be very difficult to exclude a volvulus on this study alone and if there is concern, a CT scan could better evaluate.  12-20-15: ct of abdomen and pelvis: 1. Loosely twisted sigmoid volvulus with transition point. Proximal distention is improved compared to radiography earlier today suggesting partial interval decompression. 2. Chronic findings are stable and described above.  12-20-15: flex sigmoidoscopy: chronic intermittent sigmoid volvulus. The site of volvulus was only slightly twisted during this exam, easily passed with colonoscope and the air filled dilated colon proximal to it was suctioned extensively. Her abdomen was flat, normal after the exam.     LABS REVIEWED:    01-06-15: hgb a1c 6.8; chol 178; ldl 110; trig 122; hdl 44 01-19-15: wbc 8.2; hgb 9.2; hct 31.4; mcv 90.2; plt 255; glucose 181; bun 24.4; creat 1.05; k+ 4.4; na++139 02-05-15: vit [id number]: [id number]; folic acid A999333; iron 64  02-11-15: wbc 5.3; hgb 10.2; hct 31.2; mcv 102.3; plt 145 glucose 136; bun 36; creat 1.59; k+ 4.0; na++136; liver normal albumin 3.9; lipase 20; sed rate 40  02-13-15: urine culture: gram neg rod  02-26-15: wbc 5.5 ;hgb 10.7; hct 32.1; mcv 102.9; plt  182; glucose 160; bun 23; creat 1.28; k+ 3.4; na++141; liver normal albumin 3.9; urine culture: 20,000 colonies e-coli 02-26-14: wbc 4.8; hgb 9.9; hct 30.2; mcv 103.4; plt 167; glucose 136; bun 20; creat 1.32; k+ 3.1; na++140; liver normal albumin 3.4 phos 3.6; mag 2.0; tsh 5.235  03-09-15: wbc 5.8; hgb 10.2; hct 30.3; mcv 103.1; plt 221; glucose 137; bun 14; creat 1.07; k+ 3.2; na++139; liver normal albumin 3.7; lipase 24; urine culture: e-coli: cipro 03-12-15:  wbc 4.5 hgb 7.6; hct 23.0; mcv 101.3; plt 168; glucose 138; bun 40; creat 1.24; k+ 3.0; na++141; liver normal albumin 2.8 03-15-15: wbc 5.4; hgb 8.1; hct 25.1; mcv 105.5; plt 171; glucose 144; bun 13; creat 0.96; k+ 3.2; na++141  03-18-15: wbc 5.3; hgb 7.7; hct 24.9; mcv 105; plt 188; glucose 149; bun 8.1; creat 1.12; k+ 3.6; na++143  04-30-15: H-pylori: neg  10-14-15: wbc 5.0; hgb 11.5; hct 33.9; mcv 101.2; plt 127; glucose 132; bun 15.8; creat 1.27; k+ 3.8; na++ 143; liver normal albumin 4.0  12-20-15: wbc 5.2; hgb 11.0; hct 32.4; mcv 101.3; plt 107; glucose 138; bun 31; creat 1.61; k+ 3.2; na++ 138; liver normal albumin 3.9; urine culture: e-coli: rocephin 12-21-15: wbc 3.9; hgb 9.8; hct 29.7; mcv 103.1; plt 98; glucose 108; bun 23; creat 1.15; k+ 3.2; na++ 143; mag 2.4 12-22-15: wbc 5.1; hgb 10.8; hct 32.3; mcv 103.2; plt 101; glucose 125; bun 18; creat 1.09; k+ 3.9; na++ 142      Review of Systems  Constitutional: Negative for appetite change and fatigue.  HENT: Negative for congestion.   Respiratory: Negative for cough, chest tightness and shortness of breath.   Cardiovascular: Negative for chest pain, palpitations and leg swelling.  Gastrointestinal: Negative for nausea, abdominal pain, diarrhea and constipation.  Musculoskeletal: no complaint of muscle or joint pain    Skin: Negative for pallor.  Neurological: Negative for dizziness.  Psychiatric/Behavioral: The patient is not nervous/anxious.       Physical Exam  Constitutional: No distress.  Eyes: Conjunctivae are normal.  Neck: Neck supple. No JVD present. No thyromegaly present.  Cardiovascular: Normal rate, regular rhythm and intact distal pulses.   Respiratory: Effort normal and breath sounds normal. No respiratory distress. She has no wheezes.  Is 02 dependent  GI: Soft. Bowel sounds are normal. Slight distension present no tenderness Musculoskeletal: She exhibits no edema.  Able to move all extremities .     Lymphadenopathy:    She has no cervical adenopathy.  Neurological: She is alert.  Skin: Skin is warm and dry. She is not diaphoretic.  Psychiatric: She has a normal mood and affect.     ASSESSMENT/ PLAN:  1. Hypertension: will continue norvasc 2.5 mg daily coreg 3.15 mg daily   2. Chronic diastolic heart failure: will continue lasix 40 mg daily with k+ 20 meq daily   3. Duodenal ulcer: will continue protonix 40 mg  daily    4. UTI: will continue cranberry daily   She has been treated for an e-coli uti in the hospital   5. Anemia: will continue iron daily hgb is 11.5  6. Chronic afib: will continue coreg 3.125 mg daily for rate control. Will continue asa 81mg  daily .    7. Chronic respiratory failure: is 02 dependent.   8. Allergic rhinitis: will continue flonase nightly and claritin 10 mg daily y   9. Chronic pain due to osteoarthritis: will continue lidoderm to lower back daily; will continue baclofen 5 mg nightly for leg  spasticity and will monitor   10. Depression: is stable receives benefit form lexapro 10 mg daily   11. Chronic constipation: has sigmoid volvulus: will continue colace 200 mg daily linzess 145 mcg daily; senna twice daily miralax daily   12. Glaucoma: will continue  trusopt to both eyes three times daily; travatan to both eyes nightly   13. Low back pain with radiculopathy: will continue neurontin 100 mg nightly for her bilateral foot burning/pain   14. CKD stage III: bun/creat 18/1.09   Will check cbc; cmp kub    Time spent with patient  50  minutes >50% time spent counseling; reviewing medical record; tests; labs; and developing future plan of care     Ok Edwards NP [facility]  Contact [phone] Monday through Friday 8am- 5pm  After hours call [phone]

## 2015-12-27 ENCOUNTER — Encounter: Payer: Self-pay | Admitting: Internal Medicine

## 2015-12-27 ENCOUNTER — Non-Acute Institutional Stay (SKILLED_NURSING_FACILITY): Payer: Medicare Other | Admitting: Internal Medicine

## 2015-12-27 DIAGNOSIS — B9620 Unspecified Escherichia coli [E. coli] as the cause of diseases classified elsewhere: Secondary | ICD-10-CM | POA: Diagnosis not present

## 2015-12-27 DIAGNOSIS — R197 Diarrhea, unspecified: Secondary | ICD-10-CM

## 2015-12-27 DIAGNOSIS — F0390 Unspecified dementia without behavioral disturbance: Secondary | ICD-10-CM

## 2015-12-27 DIAGNOSIS — N390 Urinary tract infection, site not specified: Secondary | ICD-10-CM

## 2015-12-27 DIAGNOSIS — I482 Chronic atrial fibrillation, unspecified: Secondary | ICD-10-CM

## 2015-12-27 DIAGNOSIS — N179 Acute kidney failure, unspecified: Secondary | ICD-10-CM

## 2015-12-27 DIAGNOSIS — I5032 Chronic diastolic (congestive) heart failure: Secondary | ICD-10-CM | POA: Diagnosis not present

## 2015-12-27 DIAGNOSIS — E1142 Type 2 diabetes mellitus with diabetic polyneuropathy: Secondary | ICD-10-CM

## 2015-12-27 DIAGNOSIS — K219 Gastro-esophageal reflux disease without esophagitis: Secondary | ICD-10-CM | POA: Diagnosis not present

## 2015-12-27 DIAGNOSIS — K562 Volvulus: Secondary | ICD-10-CM

## 2015-12-27 DIAGNOSIS — J9611 Chronic respiratory failure with hypoxia: Secondary | ICD-10-CM

## 2015-12-27 NOTE — Progress Notes (Signed)
MRN: [id number] Name: Maria Christian  Sex: female Age: 82 y.o. DOB: [birth year]  [place] #:  Facility/Room: [facility] / 220 A Level Of Care: SNF Provider: Noah Delaine. Sheppard Coil, MD Emergency Contacts: Extended Emergency Contact Information Primary Emergency Contact: [name] [place] of [place] Phone: [phone] Relation: Son Secondary Emergency Contact: Roberts,Lynn Address: [address]          [place], [place] [address] [name] of [place] Phone: [phone] Mobile Phone: [phone] Relation: Daughter  Code Status:   Allergies: Aspirin  Chief Complaint  Patient presents with  . New Admit To SNF    Admit to Facility    HPI: Patient is 82 y.o. female who is wheelchair bound, with history of dementia, Parkinson's disease, atrial fibrillation not on anticoagulation, recurrent volvulus-admitted to the hospital with abdominal pain and distention. Pt was admitted to [facility] from [facility]17[facility]20 where she was dx with a volvulus reolved with a flex sigmoidoscopy. Hospital course was complicated by UTI and AKI. Pt is d/c to SNF forgeneralized wealness. While at SNF pt will be followe for AF, tx with coreg, CHF, tx with lasix and coreg andGERD, tx with protonix.  Past Medical History:  Diagnosis Date  . Altered mental status   . Anemia   . Atrial fibrillation ([place])   . Constipation 02/27/2015  . Coronary artery disease   . DEMENTIA   . Depression   . Diabetes mellitus   . Edema of lower extremity 07/13/11   right leg more swollen than left leg  . Encephalopathy   . Enlarged heart   . Esophageal dysmotility 07/02/12  . GERD (gastroesophageal reflux disease)   . History of adenomatous polyp of colon 06/24/99  . Horseshoe kidney   . Hyperlipemia   . Hypertension   . Pancreatic lesion 05/22/11   no further workup  per PCP/family due to age  . Parkinson disease ([place])   . Peripheral neuropathy ([place])   . Sigmoid volvulus ([place]) 05/24/2013  . Thrombophlebitis   . TIA  (transient ischemic attack) 12/19/2012    Past Surgical History:  Procedure Laterality Date  . ABDOMINAL HYSTERECTOMY    . BREAST SURGERY     2 benign tumors removed left breast  . CHOLECYSTECTOMY    . ESOPHAGEAL DILATION     several times by Dr. [name]  . ESOPHAGOGASTRODUODENOSCOPY (EGD) WITH ESOPHAGEAL DILATION N/A 08/01/2012   Procedure: ESOPHAGOGASTRODUODENOSCOPY (EGD) WITH ESOPHAGEAL DILATION;  Surgeon: [name], MD;  Location: [facility] END[facility]COPY;  Service: Endoscopy;  Laterality: N/A;  with c-arm savory dilators  . ESOPHAGOGASTRODUODENOSCOPY (EGD) WITH PROPOFOL N/A 03/12/2015   Procedure: ESOPHAGOGASTRODUODENOSCOPY (EGD) WITH PROPOFOL;  Surgeon: [name], MD;  Location: [facility]OS[facility];  Service: Endoscopy;  Laterality: N/A;  . FLEXIBLE SIGMOIDOSCOPY N/A 05/24/2013   Procedure: FLEXIBLE SIGMOIDOSCOPY;  Surgeon: [name], MD;  Location: [facility]OSCOPY;  Service: Endoscopy;  Laterality: N/A;  . FLEXIBLE SIGMOIDOSCOPY N/A 05/26/2013   Procedure:  flex with decompression of sigmoid volvulus;  Surgeon: [name], MD;  Location: [facility];  Service: Endoscopy;  Laterality: N/A;  . FLEXIBLE SIGMOIDOSCOPY N/A 12/20/2015   Procedure: FLEXIBLE SIGMOIDOSCOPY;  Surgeon: Milus Banister, MD;  Location: [facility];  Service: Endoscopy;  Laterality: N/A;  . FOOT SURGERY     benign tumors from foot  . NOSE SURGERY    . SHOULDER SURGERY  2001   left clavicle excision and acromioplasty  . TOTAL HIP ARTHROPLASTY        Medication List  Accurate as of 12/27/15  9:55 AM. Always use your most recent med list.          acetaminophen 325 MG tablet Commonly known as:  TYLENOL Take 2 tablets (650 mg total) by mouth every 6 (six) hours as needed for mild pain.   alum & mag hydroxide-simeth 200-200-20 MG/5ML suspension Commonly known as:  MAALOX/MYLANTA Take 15 mLs by mouth once.   amLODipine 2.5 MG tablet Commonly known as:  NORVASC Take 2.5 mg by mouth every morning.  Hold for BP < 100/60 or HR < 60   aspirin EC 81 MG tablet Take 81 mg by mouth daily.   baclofen 10 MG tablet Commonly known as:  LIORESAL Take 5 mg by mouth at bedtime.   Biotin 5 MG/ML Liqd Take 5 mLs by mouth 2 (two) times daily.   CALAZIME SKIN PROTECTANT EX Apply 1 application topically as directed. Cleanse buttocks and apply paste daily and as needed every day and night shift for protection.   carvedilol 3.125 MG tablet Commonly known as:  COREG Take 3.125 mg by mouth daily.   CERTAGEN PO Take 1 tablet by mouth daily.   cholecalciferol 1000 units tablet Commonly known as:  VITAMIN D Take 1,000 Units by mouth every morning.   clotrimazole 1 % cream Commonly known as:  LOTRIMIN Apply 1 application topically 2 (two) times daily. Apply to buttocks , labia topically every shift for rash then apply to buttocks, labia in the evening for rash.   Cranberry 475 MG Caps Take 1 capsule by mouth 2 (two) times daily.   docusate sodium 100 MG capsule Commonly known as:  COLACE Take 200 mg by mouth at bedtime.   dorzolamide 2 % ophthalmic solution Commonly known as:  TRUSOPT Place 1 drop into both eyes 3 (three) times daily. Reported on 11/16/2015        0800,1400,2000   escitalopram 10 MG tablet Commonly known as:  LEXAPRO Take 1 tablet (10 mg total) by mouth daily.   ferrous sulfate 325 (65 FE) MG tablet Take 325 mg by mouth daily with breakfast.   fluticasone 50 MCG/ACT nasal spray Commonly known as:  FLONASE Place 2 sprays into both nostrils at bedtime.   furosemide 40 MG tablet Commonly known as:  LASIX Take 1 tablet (40 mg total) by mouth daily.   gabapentin 100 MG capsule Commonly known as:  NEURONTIN Take 100 mg by mouth at bedtime.   lidocaine 5 % Commonly known as:  LIDODERM Place 1 patch onto the skin daily. Apply 1 patch to left lower back/hip every morning.  Remove & Discard patch within 12 hours or as directed by MD   LINZESS 145 MCG Caps  capsule Generic drug:  linaclotide Take 145 mcg by mouth daily before breakfast.   loratadine 10 MG tablet Commonly known as:  CLARITIN Take 10 mg by mouth daily before breakfast.   ondansetron 8 MG tablet Commonly known as:  ZOFRAN Take 8 mg by mouth every 8 (eight) hours as needed for nausea or vomiting.   OXYGEN Inhale 2 L into the lungs continuous.   polyethylene glycol packet Commonly known as:  MIRALAX / GLYCOLAX Take 17 g by mouth daily at 6 (six) AM.   potassium chloride SA 20 MEQ tablet Commonly known as:  K-DUR,KLOR-CON Take 20 mEq by mouth daily with breakfast.   PROTONIX 40 mg/20 mL Pack Generic drug:  pantoprazole sodium Take 40 mg by mouth daily at 6 (six) AM.   senna  8.6 MG Tabs tablet Commonly known as:  SENOKOT Take 1 tablet (8.6 mg total) by mouth 2 (two) times daily.   sodium chloride 0.65 % Soln nasal spray Commonly known as:  OCEAN Place 1 spray into both nostrils as directed. Four times daily And every 24 hours as needed to moisturize nasal passages.   Travoprost (BAK Free) 0.004 % Soln ophthalmic solution Commonly known as:  TRAVATAN Place 1 drop into both eyes daily at 8 pm.   TWOCAL HN Liqd Take 90 mLs by mouth 3 (three) times daily.   vitamin B-12 500 MCG tablet Commonly known as:  CYANOCOBALAMIN Take 500 mcg by mouth every morning.       No orders of the defined types were placed in this encounter.   Immunization History  Administered Date(s) Administered  . Influenza-Unspecified 07/13/2015  . PPD Test 07/28/2013    Social History  Substance Use Topics  . Smoking status: Former Smoker    Quit date: 10/09/1964  . Smokeless tobacco: Never Used  . Alcohol use No    Family history is   Family History  Problem Relation Age of Onset  . Cancer    . Heart disease        Review of Systems  DATA OBTAINED: from patient, nurse GENERAL:  no fevers, fatigue, appetite changes SKIN: No itching, rash or wounds EYES: No eye pain,  redness, discharge EARS: No earache, tinnitus, change in hearing NOSE: No congestion, drainage or bleeding  MOUTH/THROAT: No mouth or tooth pain, No sore throat RESPIRATORY: No cough, wheezing, SOB CARDIAC: No chest pain, palpitations, lower extremity edema  GI: No abdominal pain, No N/V/D or constipation, No heartburn or reflux  GU: No dysuria, frequency or urgency, or incontinence  MUSCULOSKELETAL: No unrelieved bone/joint pain NEUROLOGIC: No headache, dizziness or focal weakness PSYCHIATRIC: No c/o anxiety or sadness   Vitals:   12/27/15 0950  BP: (!) 149/69  Pulse: 70  Resp: 17  Temp: 98.1 F (36.7 C)    SpO2 Readings from Last 1 Encounters:  12/27/15 98%        Physical Exam  GENERAL APPEARANCE: Alert, conversant,  No acute distress.  SKIN: No diaphoresis rash HEAD: Normocephalic, atraumatic  EYES: Conjunctiva/lids clear. Pupils round, reactive. EOMs intact.  EARS: External exam WNL, canals clear. Hearing grossly normal.  NOSE: No deformity or discharge.  MOUTH/THROAT: Lips w/o lesions  RESPIRATORY: Breathing is even, unlabored. Lung sounds are clear   CARDIOVASCULAR: Heart RRR no murmurs, rubs or gallops. No peripheral edema.   GASTROINTESTINAL: Abdomen is soft, non-tender, not distended w/ normal bowel sounds. GENITOURINARY: Bladder non tender, not distended  MUSCULOSKELETAL: No abnormal joints or musculature NEUROLOGIC:  Cranial nerves 2-12 grossly intact. Moves all extremities  PSYCHIATRIC: Mood and affect appropriate to situation, no behavioral issues  Patient Active Problem List   Diagnosis Date Noted  . Sigmoid volvulus ([place]) 12/20/2015  . Hypertensive heart disease with congestive heart failure ([place]) 12/02/2015  . Vitamin B12 deficiency 09/27/2015  . Skin yeast infection 05/10/2015  . Abrasion of buttock 05/10/2015  . Infection due to ESBL-producing Escherichia coli 04/19/2015  . Low back pain potentially associated with radiculopathy 04/18/2015  .  Chronic respiratory failure ([place]) 03/16/2015  . Allergic rhinitis 03/16/2015  . Chronic pain 03/16/2015  . Osteoarthritis of multiple joints 03/16/2015  . Duodenal ulcer 03/12/2015  . GI bleed 03/11/2015  . Melena 03/11/2015  . Pressure ulcer 03/11/2015  . Headache 03/09/2015  . Abdominal distension 03/01/2015  . Cephalgia 03/01/2015  .  Nausea and vomiting 02/27/2015  . Constipation 02/27/2015  . Anemia, iron deficiency 02/27/2015  . Anemia of chronic disease 02/27/2015  . Chronic atrial fibrillation ([place]) 02/27/2015  . Hypokalemia 02/27/2015  . CKD (chronic kidney disease) stage 3, GFR 30-59 ml/min 02/27/2015  . Ileus ([place]) 02/26/2015  . Malnutrition of moderate degree ([place]) 10/10/2013  . FTT (failure to thrive) in adult 07/29/2013  . Dementia 12/19/2012  . Depression 12/19/2012  . Coronary artery disease   . Diastolic CHF, chronic ([facility]) 05/24/2011       Component Value Date/Time   WBC 5.1 12/22/2015 0543   RBC 3.13 (L) 12/22/2015 0543   HGB 10.8 (L) 12/22/2015 0543   HCT 32.3 (L) 12/22/2015 0543   PLT 101 (L) 12/22/2015 0543   MCV 103.2 (H) 12/22/2015 0543   LYMPHSABS 1.1 04/19/2015 1405   MONOABS 0.4 04/19/2015 1405   EOSABS 0.3 04/19/2015 1405   BASOSABS 0.0 04/19/2015 1405        Component Value Date/Time   NA 142 12/22/2015 0543   NA 142 12/22/2015   K 3.9 12/22/2015 0543   CL 117 (H) 12/22/2015 0543   CO2 20 (L) 12/22/2015 0543   GLUCOSE 125 (H) 12/22/2015 0543   BUN 18 12/22/2015 0543   BUN 18 12/22/2015   CREATININE 1.09 (H) 12/22/2015 0543   CALCIUM 8.7 (L) 12/22/2015 0543   PROT 7.1 12/20/2015 0130   ALBUMIN 3.9 12/20/2015 0130   AST 22 12/20/2015 0130   ALT 13 (L) 12/20/2015 0130   ALKPHOS 95 12/20/2015 0130   BILITOT 0.8 12/20/2015 0130   GFRNONAA 44 (L) 12/22/2015 0543   GFRAA 51 (L) 12/22/2015 0543    Lab Results  Component Value Date   HGBA1C 6.1 (A) 07/17/2014    Lab Results  Component Value Date   CHOL 137 06/12/2014   HDL 53  06/12/2014   LDLCALC 68 06/12/2014   TRIG 83 06/12/2014   CHOLHDL 2.3 05/23/2011     Ct Abdomen Pelvis Wo Contrast  Result Date: 12/20/2015 CLINICAL DATA:  Abdominal pain EXAM: CT ABDOMEN AND PELVIS WITHOUT CONTRAST TECHNIQUE: Multidetector CT imaging of the abdomen and pelvis was performed following the standard protocol without IV contrast. COMPARISON:  03/09/2015 FINDINGS: Lower chest and abdominal wall: Cardiomegaly. Extensive coronary and aortic atherosclerosis. Hepatobiliary: No focal liver abnormality.Cholecystectomy. Pancreas: Chronic cystic change in the pancreatic body without appreciable change compared to prior. This area continues to measure 24 mm in length. Continued stability suggests benign process. Spleen: Unremarkable. Adrenals/Urinary Tract: Negative adrenals.Horseshoe kidney with renal atrophy. No hydronephrosis or stone. Unremarkable bladder. Stomach/Bowel: Distended colon above the distal sigmoid which shows mesenteric and bowel twisting. The twist is relatively loose but still causes transition point. Fluid seen in the rectum. Colonic distention is improved compared to radiography earlier today with distal sigmoid measuring 9 cm in maximal diameter compared at 13 cm previously. Uncomplicated few diverticula. Reproductive:Hysterectomy. Vascular/Lymphatic: No acute vascular abnormality. Extensive atherosclerotic calcification. No mass or adenopathy. Other: Small left pelvic ascites, presumably reactive. Musculoskeletal: No acute finding.  Total left hip arthroplasty. IMPRESSION: 1. Loosely twisted sigmoid volvulus with transition point. Proximal distention is improved compared to radiography earlier today suggesting partial interval decompression. 2. Chronic findings are stable and described above. Electronically Signed   By: Monte Fantasia M.D.   On: 12/20/2015 06:37   Dg Abd Acute W/chest  Result Date: 12/20/2015 CLINICAL DATA:  Ileus. EXAM: DG ABDOMEN ACUTE W/ 1V CHEST  COMPARISON:  [facility] March 11, 2015 and chest x-ray  January 05, 2014 FINDINGS: Stable cardiomegaly. No pneumothorax. Mild increased interstitial opacities suggest mild edema. No free air, portal venous gas, or pneumatosis. Again noted are dilated loops of colon without definitive gas in the rectum. Overall, colonic distention has mildly worsened in the interval measuring up to 12.6 cm today versus 11 cm previously. IMPRESSION: 1. Markedly distended loops of colon without definitive gas in the rectum. Given the similar pattern on previous studies and history, this likely represents chronic colonic ileus. It would be very difficult to exclude a volvulus on this study alone and if there is concern, a CT scan could better evaluate. Electronically Signed   By: Dorise Bullion III M.D   On: 12/20/2015 03:07    Not all labs, radiology exams or other studies done during hospitalization come through on my EPIC note; however they are reviewed by me.    Assessment and Plan  SIGMOID VOLVULUSNo further abdominal distention, underwent decompression by flexible sigmoidoscopy on 7/17. Tolerating diet; n plans for surgical intervention SNF - OT/PT  DIARRHEA- started on emperic vanc, stool sudies not sent;diarrhe resolved on its own  UTI - + E Coli, pansensitive;pt had 3 doses IV Rocephin;no further antibiotics needed  AKI- mild resoved with IVF SNF - f/u BMP  CHRONIC HYPOXEMIC RESP FAILURE SNF - STABLE;CONT HOME o2  CHRONIC DIASTOLIC CHF SNF - compensated, cont coreg and lasix  HX AFIB SNF- rate controlled with coreg;not on anticoag 2/2 age, frailty  GERD SNF- cont prrotonix 40 mg daily  PARKINSON'S DX/ DEMENTIA SNF- cont neurontin, lidocaine patch  DM2 SNF - controlled with diet     Time spent > 45 min;> 50% of time with patient was spent reviewing records, labs, tests and studies, counseling and developing plan of care  Webb Silversmith D. Sheppard Coil, MD

## 2016-01-04 DIAGNOSIS — K264 Chronic or unspecified duodenal ulcer with hemorrhage: Secondary | ICD-10-CM | POA: Diagnosis not present

## 2016-01-04 DIAGNOSIS — F0390 Unspecified dementia without behavioral disturbance: Secondary | ICD-10-CM | POA: Diagnosis not present

## 2016-01-04 DIAGNOSIS — B351 Tinea unguium: Secondary | ICD-10-CM | POA: Diagnosis not present

## 2016-01-04 DIAGNOSIS — F411 Generalized anxiety disorder: Secondary | ICD-10-CM | POA: Diagnosis not present

## 2016-01-04 DIAGNOSIS — K219 Gastro-esophageal reflux disease without esophagitis: Secondary | ICD-10-CM | POA: Insufficient documentation

## 2016-01-04 DIAGNOSIS — M545 Low back pain: Secondary | ICD-10-CM | POA: Diagnosis not present

## 2016-01-04 DIAGNOSIS — N390 Urinary tract infection, site not specified: Secondary | ICD-10-CM | POA: Diagnosis not present

## 2016-01-04 DIAGNOSIS — I10 Essential (primary) hypertension: Secondary | ICD-10-CM | POA: Diagnosis not present

## 2016-01-04 DIAGNOSIS — K562 Volvulus: Secondary | ICD-10-CM | POA: Diagnosis not present

## 2016-01-04 DIAGNOSIS — M79672 Pain in left foot: Secondary | ICD-10-CM | POA: Diagnosis not present

## 2016-01-04 DIAGNOSIS — I5032 Chronic diastolic (congestive) heart failure: Secondary | ICD-10-CM | POA: Diagnosis not present

## 2016-01-04 DIAGNOSIS — Z7982 Long term (current) use of aspirin: Secondary | ICD-10-CM | POA: Diagnosis not present

## 2016-01-04 DIAGNOSIS — R319 Hematuria, unspecified: Secondary | ICD-10-CM | POA: Diagnosis not present

## 2016-01-04 DIAGNOSIS — K598 Other specified functional intestinal disorders: Secondary | ICD-10-CM | POA: Diagnosis not present

## 2016-01-04 DIAGNOSIS — D599 Acquired hemolytic anemia, unspecified: Secondary | ICD-10-CM | POA: Diagnosis not present

## 2016-01-04 DIAGNOSIS — G20 Parkinson's disease: Secondary | ICD-10-CM | POA: Diagnosis not present

## 2016-01-04 DIAGNOSIS — H01009 Unspecified blepharitis unspecified eye, unspecified eyelid: Secondary | ICD-10-CM | POA: Diagnosis not present

## 2016-01-04 DIAGNOSIS — E1142 Type 2 diabetes mellitus with diabetic polyneuropathy: Secondary | ICD-10-CM | POA: Diagnosis not present

## 2016-01-04 DIAGNOSIS — H04123 Dry eye syndrome of bilateral lacrimal glands: Secondary | ICD-10-CM | POA: Diagnosis not present

## 2016-01-04 DIAGNOSIS — F0391 Unspecified dementia with behavioral disturbance: Secondary | ICD-10-CM | POA: Diagnosis not present

## 2016-01-04 DIAGNOSIS — M7989 Other specified soft tissue disorders: Secondary | ICD-10-CM | POA: Diagnosis not present

## 2016-01-04 DIAGNOSIS — Z87891 Personal history of nicotine dependence: Secondary | ICD-10-CM | POA: Diagnosis not present

## 2016-01-04 DIAGNOSIS — I5041 Acute combined systolic (congestive) and diastolic (congestive) heart failure: Secondary | ICD-10-CM | POA: Diagnosis not present

## 2016-01-04 DIAGNOSIS — J309 Allergic rhinitis, unspecified: Secondary | ICD-10-CM | POA: Diagnosis not present

## 2016-01-04 DIAGNOSIS — R197 Diarrhea, unspecified: Secondary | ICD-10-CM | POA: Insufficient documentation

## 2016-01-04 DIAGNOSIS — M6281 Muscle weakness (generalized): Secondary | ICD-10-CM | POA: Diagnosis not present

## 2016-01-04 DIAGNOSIS — R100 Acute abdomen: Secondary | ICD-10-CM | POA: Diagnosis not present

## 2016-01-04 DIAGNOSIS — E119 Type 2 diabetes mellitus without complications: Secondary | ICD-10-CM | POA: Insufficient documentation

## 2016-01-04 DIAGNOSIS — I110 Hypertensive heart disease with heart failure: Secondary | ICD-10-CM | POA: Diagnosis not present

## 2016-01-04 DIAGNOSIS — R109 Unspecified abdominal pain: Secondary | ICD-10-CM | POA: Diagnosis present

## 2016-01-04 DIAGNOSIS — I130 Hypertensive heart and chronic kidney disease with heart failure and stage 1 through stage 4 chronic kidney disease, or unspecified chronic kidney disease: Secondary | ICD-10-CM | POA: Diagnosis not present

## 2016-01-04 DIAGNOSIS — B9620 Unspecified Escherichia coli [E. coli] as the cause of diseases classified elsewhere: Secondary | ICD-10-CM | POA: Insufficient documentation

## 2016-01-04 DIAGNOSIS — B3749 Other urogenital candidiasis: Secondary | ICD-10-CM | POA: Diagnosis not present

## 2016-01-04 DIAGNOSIS — F339 Major depressive disorder, recurrent, unspecified: Secondary | ICD-10-CM | POA: Diagnosis not present

## 2016-01-04 DIAGNOSIS — J9611 Chronic respiratory failure with hypoxia: Secondary | ICD-10-CM | POA: Diagnosis not present

## 2016-01-04 DIAGNOSIS — E1122 Type 2 diabetes mellitus with diabetic chronic kidney disease: Secondary | ICD-10-CM | POA: Diagnosis not present

## 2016-01-04 DIAGNOSIS — N189 Chronic kidney disease, unspecified: Secondary | ICD-10-CM

## 2016-01-04 DIAGNOSIS — E1140 Type 2 diabetes mellitus with diabetic neuropathy, unspecified: Secondary | ICD-10-CM | POA: Diagnosis not present

## 2016-01-04 DIAGNOSIS — I4891 Unspecified atrial fibrillation: Secondary | ICD-10-CM | POA: Diagnosis not present

## 2016-01-04 DIAGNOSIS — K567 Ileus, unspecified: Secondary | ICD-10-CM | POA: Diagnosis not present

## 2016-01-04 DIAGNOSIS — N179 Acute kidney failure, unspecified: Secondary | ICD-10-CM | POA: Insufficient documentation

## 2016-01-04 DIAGNOSIS — Z79899 Other long term (current) drug therapy: Secondary | ICD-10-CM | POA: Diagnosis not present

## 2016-01-04 DIAGNOSIS — E785 Hyperlipidemia, unspecified: Secondary | ICD-10-CM | POA: Diagnosis not present

## 2016-01-04 DIAGNOSIS — L89152 Pressure ulcer of sacral region, stage 2: Secondary | ICD-10-CM | POA: Diagnosis not present

## 2016-01-04 DIAGNOSIS — I2510 Atherosclerotic heart disease of native coronary artery without angina pectoris: Secondary | ICD-10-CM | POA: Diagnosis not present

## 2016-01-04 DIAGNOSIS — I25110 Atherosclerotic heart disease of native coronary artery with unstable angina pectoris: Secondary | ICD-10-CM | POA: Diagnosis not present

## 2016-01-04 DIAGNOSIS — I482 Chronic atrial fibrillation: Secondary | ICD-10-CM | POA: Diagnosis not present

## 2016-01-04 DIAGNOSIS — N270 Small kidney, unilateral: Secondary | ICD-10-CM | POA: Diagnosis not present

## 2016-01-04 DIAGNOSIS — M79671 Pain in right foot: Secondary | ICD-10-CM | POA: Diagnosis not present

## 2016-01-04 DIAGNOSIS — D696 Thrombocytopenia, unspecified: Secondary | ICD-10-CM | POA: Diagnosis not present

## 2016-01-04 DIAGNOSIS — D509 Iron deficiency anemia, unspecified: Secondary | ICD-10-CM | POA: Diagnosis not present

## 2016-01-04 DIAGNOSIS — N183 Chronic kidney disease, stage 3 (moderate): Secondary | ICD-10-CM | POA: Diagnosis not present

## 2016-01-04 DIAGNOSIS — D638 Anemia in other chronic diseases classified elsewhere: Secondary | ICD-10-CM | POA: Diagnosis not present

## 2016-01-04 DIAGNOSIS — J449 Chronic obstructive pulmonary disease, unspecified: Secondary | ICD-10-CM | POA: Diagnosis not present

## 2016-01-04 DIAGNOSIS — N289 Disorder of kidney and ureter, unspecified: Secondary | ICD-10-CM | POA: Diagnosis not present

## 2016-01-04 DIAGNOSIS — H401134 Primary open-angle glaucoma, bilateral, indeterminate stage: Secondary | ICD-10-CM | POA: Diagnosis not present

## 2016-01-04 DIAGNOSIS — G4700 Insomnia, unspecified: Secondary | ICD-10-CM | POA: Diagnosis not present

## 2016-01-04 DIAGNOSIS — Z8673 Personal history of transient ischemic attack (TIA), and cerebral infarction without residual deficits: Secondary | ICD-10-CM | POA: Diagnosis not present

## 2016-01-04 DIAGNOSIS — M150 Primary generalized (osteo)arthritis: Secondary | ICD-10-CM | POA: Diagnosis not present

## 2016-01-04 DIAGNOSIS — M179 Osteoarthritis of knee, unspecified: Secondary | ICD-10-CM | POA: Diagnosis not present

## 2016-01-04 DIAGNOSIS — K260 Acute duodenal ulcer with hemorrhage: Secondary | ICD-10-CM | POA: Diagnosis not present

## 2016-01-04 DIAGNOSIS — M542 Cervicalgia: Secondary | ICD-10-CM | POA: Diagnosis not present

## 2016-01-04 DIAGNOSIS — Z96649 Presence of unspecified artificial hip joint: Secondary | ICD-10-CM | POA: Diagnosis not present

## 2016-01-04 LAB — CBC AND DIFFERENTIAL
HCT: 32 % — AB (ref 36–46)
HEMOGLOBIN: 10.9 g/dL — AB (ref 12.0–16.0)
PLATELETS: 175 10*3/uL (ref 150–399)
WBC: 4.1 10*3/mL

## 2016-01-04 LAB — BASIC METABOLIC PANEL
BUN: 13 mg/dL (ref 4–21)
CREATININE: 1.2 mg/dL — AB (ref 0.5–1.1)
GLUCOSE: 121 mg/dL
POTASSIUM: 3.8 mmol/L (ref 3.4–5.3)
Sodium: 144 mmol/L (ref 137–147)

## 2016-01-04 LAB — HEPATIC FUNCTION PANEL
ALT: 9 U/L (ref 7–35)
AST: 15 U/L (ref 13–35)
Alkaline Phosphatase: 91 U/L (ref 25–125)
Bilirubin, Total: 0.3 mg/dL

## 2016-01-05 ENCOUNTER — Encounter: Payer: Self-pay | Admitting: Adult Health

## 2016-01-05 NOTE — Progress Notes (Signed)
Patient ID: Maria Christian, female   DOB: Aug 24, 1924, 82 y.o.   MRN: [id number]    Location:   [facility] Room Number: 220-A Place of Service:  SNF (31)   CODE STATUS: DNR  Allergies  Allergen Reactions  . Aspirin Other (See Comments)    [name]    Chief Complaint  Patient presents with  . Acute Visit    Vaginal Itching    HPI:    Past Medical History:  Diagnosis Date  . Altered mental status   . Anemia   . Atrial fibrillation ([place])   . Constipation 02/27/2015  . Coronary artery disease   . DEMENTIA   . Depression   . Diabetes mellitus   . Edema of lower extremity 07/13/11   right leg more swollen than left leg  . Encephalopathy   . Enlarged heart   . Esophageal dysmotility 07/02/12  . GERD (gastroesophageal reflux disease)   . History of adenomatous polyp of colon 06/24/99  . Horseshoe kidney   . Hyperlipemia   . Hypertension   . Pancreatic lesion 05/22/11   no further workup  per PCP/family due to age  . Parkinson disease ([place])   . Peripheral neuropathy ([place])   . Sigmoid volvulus ([place]) 05/24/2013  . Thrombophlebitis   . TIA (transient ischemic attack) 12/19/2012    Past Surgical History:  Procedure Laterality Date  . ABDOMINAL HYSTERECTOMY    . BREAST SURGERY     2 benign tumors removed left breast  . CHOLECYSTECTOMY    . ESOPHAGEAL DILATION     several times by Dr. [name]  . ESOPHAGOGASTRODUODENOSCOPY (EGD) WITH ESOPHAGEAL DILATION N/A 08/01/2012   Procedure: ESOPHAGOGASTRODUODENOSCOPY (EGD) WITH ESOPHAGEAL DILATION;  Surgeon: [name], MD;  Location: [facility];  Service: Endoscopy;  Laterality: N/A;  with c-arm savory dilators  . ESOPHAGOGASTRODUODENOSCOPY (EGD) WITH PROPOFOL N/A 03/12/2015   Procedure: ESOPHAGOGASTRODUODENOSCOPY (EGD) WITH PROPOFOL;  Surgeon: [name], MD;  Location: [facility];  Service: Endoscopy;  Laterality: N/A;  . FLEXIBLE SIGMOIDOSCOPY N/A 05/24/2013   Procedure: FLEXIBLE SIGMOIDOSCOPY;   Surgeon: [name], MD;  Location: [facility];  Service: Endoscopy;  Laterality: N/A;  . FLEXIBLE SIGMOIDOSCOPY N/A 05/26/2013   Procedure:  flex with decompression of sigmoid volvulus;  Surgeon: [name], MD;  Location: [facility];  Service: Endoscopy;  Laterality: N/A;  . FLEXIBLE SIGMOIDOSCOPY N/A 12/20/2015   Procedure: FLEXIBLE SIGMOIDOSCOPY;  Surgeon: Milus Banister, MD;  Location: [facility];  Service: Endoscopy;  Laterality: N/A;  . FOOT SURGERY     benign tumors from foot  . NOSE SURGERY    . SHOULDER SURGERY  2001   left clavicle excision and acromioplasty  . TOTAL HIP ARTHROPLASTY      Social History   Social History  . Marital status: Widowed    Spouse name: N/A  . Number of children: 5  . Years of education: 10th   Occupational History  . Retired    Social History Main Topics  . Smoking status: Former Smoker    Quit date: 10/09/1964  . Smokeless tobacco: Never Used  . Alcohol use No  . Drug use: No  . Sexual activity: No   Other Topics Concern  . Not on file   Social History Narrative   Pt lives at [facility] and [place].   Caffeine Use: very small amount daily   Family History  Problem Relation Age of Onset  . Cancer    .  Heart disease        VITAL SIGNS BP (!) 150/80   Pulse 70   Temp 98.3 F (36.8 C) (Oral)   Resp 18   Ht 5\' 7"  (1.702 m)   Wt 146 lb (66.2 kg)   SpO2 98%   BMI 22.87 kg/m   Patient's Medications  New Prescriptions   No medications on file  Previous Medications   ACETAMINOPHEN (TYLENOL) 325 MG TABLET    Take 2 tablets (650 mg total) by mouth every 6 (six) hours as needed for mild pain.   ALUM & MAG HYDROXIDE-SIMETH (MAALOX/MYLANTA) [id number] MG/5ML SUSPENSION    Take 15 mLs by mouth once.   AMLODIPINE (NORVASC) 2.5 MG TABLET    Take 2.5 mg by mouth every morning. Hold for BP < 100/60 or HR < 60   ASPIRIN EC 81 MG TABLET    Take 81 mg by mouth daily.   BACLOFEN (LIORESAL) 10 MG TABLET    Take 5 mg by  mouth at bedtime.   BIOTIN 5 MG/ML LIQD    Take 5 mLs by mouth 2 (two) times daily.    CARVEDILOL (COREG) 3.125 MG TABLET    Take 3.125 mg by mouth daily.   CHOLECALCIFEROL (VITAMIN D) 1000 UNITS TABLET    Take 1,000 Units by mouth every morning.    CLOTRIMAZOLE (LOTRIMIN) 1 % CREAM    Apply 1 application topically 2 (two) times daily. Apply to buttocks , labia topically every shift for rash then apply to buttocks, labia in the evening for rash.   CRANBERRY 475 MG CAPS    Take 1 capsule by mouth 2 (two) times daily.    DOCUSATE SODIUM (COLACE) 100 MG CAPSULE    Take 200 mg by mouth at bedtime.    DORZOLAMIDE (TRUSOPT) 2 % OPHTHALMIC SOLUTION    Place 1 drop into both eyes 3 (three) times daily. Reported on 11/16/2015        0800,1400,2000   ESCITALOPRAM (LEXAPRO) 10 MG TABLET    Take 1 tablet (10 mg total) by mouth daily.   FERROUS SULFATE 325 (65 FE) MG TABLET    Take 325 mg by mouth daily with breakfast.   FLUTICASONE (FLONASE) 50 MCG/ACT NASAL SPRAY    Place 2 sprays into both nostrils at bedtime.   FUROSEMIDE (LASIX) 40 MG TABLET    Take 1 tablet (40 mg total) by mouth daily.   GABAPENTIN (NEURONTIN) 100 MG CAPSULE    Take 100 mg by mouth at bedtime.   LIDOCAINE (LIDODERM) 5 %    Place 1 patch onto the skin daily. Apply 1 patch to left lower back/hip every morning.  Remove & Discard patch within 12 hours or as directed by MD   LINACLOTIDE (LINZESS) 145 MCG CAPS CAPSULE    Take 145 mcg by mouth daily before breakfast.    LORATADINE (CLARITIN) 10 MG TABLET    Take 10 mg by mouth daily before breakfast.    MULTIPLE VITAMINS-MINERALS (CERTAGEN PO)    Take 1 tablet by mouth daily.    NUTRITIONAL SUPPLEMENTS (TWOCAL HN) LIQD    Take 90 mLs by mouth 3 (three) times daily.   ONDANSETRON (ZOFRAN) 8 MG TABLET    Take 8 mg by mouth every 8 (eight) hours as needed for nausea or vomiting.   OXYGEN    Inhale 2 L into the lungs continuous.   POLYETHYLENE GLYCOL (MIRALAX / GLYCOLAX) PACKET    Take 17 g by  mouth daily at  6 (six) AM.    POTASSIUM CHLORIDE SA (K-DUR,KLOR-CON) 20 MEQ TABLET    Take 40 mEq by mouth 2 (two) times daily.    PROTONIX 40 MG PACK    Take 40 mg by mouth daily at 6 (six) AM.   SENNA (SENOKOT) 8.6 MG TABS TABLET    Take 1 tablet (8.6 mg total) by mouth 2 (two) times daily.   SKIN PROTECTANTS, MISC. (CALAZIME SKIN PROTECTANT EX)    Apply 1 application topically as directed. Cleanse buttocks and apply paste daily and as needed every day and night shift for protection.   SODIUM CHLORIDE (OCEAN) 0.65 % SOLN NASAL SPRAY    Place 1 spray into both nostrils as directed. Four times daily And every 24 hours as needed to moisturize nasal passages.   TRAVOPROST, BAK FREE, (TRAVATAN) 0.004 % SOLN OPHTHALMIC SOLUTION    Place 1 drop into both eyes daily at 8 pm.    VITAMIN B-12 (CYANOCOBALAMIN) 500 MCG TABLET    Take 500 mcg by mouth every morning.   Modified Medications   No medications on file  Discontinued Medications   No medications on file     SIGNIFICANT DIAGNOSTIC EXAMS  12-21-14: chest x-ray: no acute cardiopulmonary process   01-02-15: left hand x-ray: moderate osteoarthritis   02-15-15: abdominal ultrasound: 1. Gallbladder not visualized. 2. No hydronephrosis no renal calculus; 3. The pancreas not well visualized; which could be related to gassy abdomen but an appearance which might represent pancreatitis in the appropriate clinical setting.   02-26-15: right lower extremity doppler: - No evidence of deep vein thrombosis involving the right lower   extremity. - No evidence of Baker&'s cyst on the right.  03-12-15: upper endoscopy: duodenal bulb 1.5-2.0 cm ulcer with clot at base. No active bleeding. Surrounding mucosa was edematous. Biopsy of the surrounding mucosa were done.   05-11-15: right ankle x-ray: normal   12-19-15: kub: moderate colonic ileus   12-20-15: kub: 1. Markedly distended loops of colon without definitive gas in the rectum. Given the similar pattern on  previous studies and history, this likely represents chronic colonic ileus. It would be very difficult to exclude a volvulus on this study alone and if there is concern, a CT scan could better evaluate.  12-20-15: ct of abdomen and pelvis: 1. Loosely twisted sigmoid volvulus with transition point. Proximal distention is improved compared to radiography earlier today suggesting partial interval decompression. 2. Chronic findings are stable and described above.  12-20-15: flex sigmoidoscopy: chronic intermittent sigmoid volvulus. The site of volvulus was only slightly twisted during this exam, easily passed with colonoscope and the air filled dilated colon proximal to it was suctioned extensively. Her abdomen was flat, normal after the exam.     LABS REVIEWED:    01-06-15: hgb a1c 6.8; chol 178; ldl 110; trig 122; hdl 44 01-19-15: wbc 8.2; hgb 9.2; hct 31.4; mcv 90.2; plt 255; glucose 181; bun 24.4; creat 1.05; k+ 4.4; na++139 02-05-15: vit [id number]: [id number]; folic acid A999333; iron 64  02-11-15: wbc 5.3; hgb 10.2; hct 31.2; mcv 102.3; plt 145 glucose 136; bun 36; creat 1.59; k+ 4.0; na++136; liver normal albumin 3.9; lipase 20; sed rate 40  02-13-15: urine culture: gram neg rod  02-26-15: wbc 5.5 ;hgb 10.7; hct 32.1; mcv 102.9; plt 182; glucose 160; bun 23; creat 1.28; k+ 3.4; na++141; liver normal albumin 3.9; urine culture: 20,000 colonies e-coli 02-26-14: wbc 4.8; hgb 9.9; hct 30.2; mcv 103.4; plt 167; glucose 136; bun 20; creat  1.32; k+ 3.1; na++140; liver normal albumin 3.4 phos 3.6; mag 2.0; tsh 5.235  03-09-15: wbc 5.8; hgb 10.2; hct 30.3; mcv 103.1; plt 221; glucose 137; bun 14; creat 1.07; k+ 3.2; na++139; liver normal albumin 3.7; lipase 24; urine culture: e-coli: cipro 03-12-15: wbc 4.5 hgb 7.6; hct 23.0; mcv 101.3; plt 168; glucose 138; bun 40; creat 1.24; k+ 3.0; na++141; liver normal albumin 2.8 03-15-15: wbc 5.4; hgb 8.1; hct 25.1; mcv 105.5; plt 171; glucose 144; bun 13; creat 0.96; k+ 3.2; na++141    03-18-15: wbc 5.3; hgb 7.7; hct 24.9; mcv 105; plt 188; glucose 149; bun 8.1; creat 1.12; k+ 3.6; na++143  04-30-15: H-pylori: neg  10-14-15: wbc 5.0; hgb 11.5; hct 33.9; mcv 101.2; plt 127; glucose 132; bun 15.8; creat 1.27; k+ 3.8; na++ 143; liver normal albumin 4.0  12-20-15: wbc 5.2; hgb 11.0; hct 32.4; mcv 101.3; plt 107; glucose 138; bun 31; creat 1.61; k+ 3.2; na++ 138; liver normal albumin 3.9; urine culture: e-coli: rocephin 12-21-15: wbc 3.9; hgb 9.8; hct 29.7; mcv 103.1; plt 98; glucose 108; bun 23; creat 1.15; k+ 3.2; na++ 143; mag 2.4 12-22-15: wbc 5.1; hgb 10.8; hct 32.3; mcv 103.2; plt 101; glucose 125; bun 18; creat 1.09; k+ 3.9; na++ 142      Review of Systems  Constitutional: Negative for appetite change and fatigue.  HENT: Negative for congestion.   Respiratory: Negative for cough, chest tightness and shortness of breath.   Cardiovascular: Negative for chest pain, palpitations and leg swelling.  Gastrointestinal: Negative for nausea, abdominal pain, diarrhea and constipation.  Musculoskeletal: no complaint of muscle or joint pain    Skin: Negative for pallor.  Neurological: Negative for dizziness.  Psychiatric/Behavioral: The patient is not nervous/anxious.       Physical Exam  Constitutional: No distress.  Eyes: Conjunctivae are normal.  Neck: Neck supple. No JVD present. No thyromegaly present.  Cardiovascular: Normal rate, regular rhythm and intact distal pulses.   Respiratory: Effort normal and breath sounds normal. No respiratory distress. She has no wheezes.  Is 02 dependent  GI: Soft. Bowel sounds are normal. Slight distension present no tenderness Musculoskeletal: She exhibits no edema.  Able to move all extremities .   Lymphadenopathy:    She has no cervical adenopathy.  Neurological: She is alert.  Skin: Skin is warm and dry. She is not diaphoretic.  Psychiatric: She has a normal mood and affect.     ASSESSMENT/ PLAN:  1. Hypertension: will  continue norvasc 2.5 mg daily coreg 3.15 mg daily   2. Chronic diastolic heart failure: will continue lasix 40 mg daily with k+ 20 meq daily   3. Duodenal ulcer: will continue protonix 40 mg  daily    4. UTI: will continue cranberry daily   She has been treated for an e-coli uti in the hospital   5. Anemia: will continue iron daily hgb is 11.5  6. Chronic afib: will continue coreg 3.125 mg daily for rate control. Will continue asa 81mg  daily .    7. Chronic respiratory failure: is 02 dependent.   8. Allergic rhinitis: will continue flonase nightly and claritin 10 mg daily y   9. Chronic pain due to osteoarthritis: will continue lidoderm to lower back daily; will continue baclofen 5 mg nightly for leg spasticity and will monitor   10. Depression: is stable receives benefit form lexapro 10 mg daily   11. Chronic constipation: has sigmoid volvulus: will continue colace 200 mg daily linzess 145 mcg daily;  senna twice daily miralax daily   12. Glaucoma: will continue  trusopt to both eyes three times daily; travatan to both eyes nightly   13. Low back pain with radiculopathy: will continue neurontin 100 mg nightly for her bilateral foot burning/pain   14. CKD stage III: bun/creat 18/1.09   Will check cbc; cmp kub    Time spent with patient  50  minutes >50% time spent counseling; reviewing medical record; tests; labs; and developing future plan of care           MD is aware of resident's narcotic use and is in agreement with current plan of care. We will attempt to wean resident as apropriate   Ok Edwards NP [facility]  Contact [phone] Monday through Friday 8am- 5pm  After hours call [phone]

## 2016-01-05 NOTE — Progress Notes (Signed)
This encounter was created in error - please disregard.

## 2016-01-13 ENCOUNTER — Encounter: Payer: Self-pay | Admitting: Internal Medicine

## 2016-01-13 ENCOUNTER — Non-Acute Institutional Stay (SKILLED_NURSING_FACILITY): Payer: Medicare Other | Admitting: Internal Medicine

## 2016-01-13 DIAGNOSIS — M159 Polyosteoarthritis, unspecified: Secondary | ICD-10-CM

## 2016-01-13 DIAGNOSIS — I5032 Chronic diastolic (congestive) heart failure: Secondary | ICD-10-CM | POA: Diagnosis not present

## 2016-01-13 DIAGNOSIS — D638 Anemia in other chronic diseases classified elsewhere: Secondary | ICD-10-CM

## 2016-01-13 DIAGNOSIS — E1142 Type 2 diabetes mellitus with diabetic polyneuropathy: Secondary | ICD-10-CM

## 2016-01-13 DIAGNOSIS — F0390 Unspecified dementia without behavioral disturbance: Secondary | ICD-10-CM | POA: Diagnosis not present

## 2016-01-13 DIAGNOSIS — I10 Essential (primary) hypertension: Secondary | ICD-10-CM

## 2016-01-13 DIAGNOSIS — J9611 Chronic respiratory failure with hypoxia: Secondary | ICD-10-CM

## 2016-01-13 DIAGNOSIS — M150 Primary generalized (osteo)arthritis: Secondary | ICD-10-CM | POA: Diagnosis not present

## 2016-01-13 DIAGNOSIS — K219 Gastro-esophageal reflux disease without esophagitis: Secondary | ICD-10-CM | POA: Diagnosis not present

## 2016-01-13 DIAGNOSIS — I482 Chronic atrial fibrillation, unspecified: Secondary | ICD-10-CM

## 2016-01-13 NOTE — Progress Notes (Signed)
Patient ID: Maria Christian, female   DOB: 01/13/1925, 82 y.o.   MRN: [id number]    DATE: 01/13/16  Location:    [facility] Room Number: 102 Place of Service: SNF (31)   Extended Emergency Contact Information Primary Emergency Contact: [facility] Phone: [phone] Relation: Son Secondary Emergency Contact: Roberts,Lynn Address: [address]          [place], [place] [address] [place] Phone: [phone] Mobile Phone: [phone] Relation: Daughter  Advanced Directive information Does patient have an advance directive?: Yes, Type of Advance Directive: Out of facility DNR (pink MOST or yellow form), Does patient want to make changes to advanced directive?: No - Patient declined  Chief Complaint  Patient presents with  . Medical Management of Chronic Issues    Routine Visit    HPI:  82 yo female long term resident seen today for f/u. She c/o LLE paresthesias. She currently is on gabapentin for neuropathy. No other concerns. No nursing issues. No falls. She was recently hospitalized for recurrent volvulus. She is a poor historian due to dementia. Hx obtained from chart.  Hypertension - stable on norvasc 2.5 mg daily; coreg 3.15 mg daily   Chronic diastolic heart failure - stable on lasix 40 mg daily with k+ 20 meq daily   Hx Duodenal ulcer - stable on protonix 40 mg  daily    Recurrent UTI - takes cranberry daily for prevention. She completed tx for E coli UTI last hospital admission  Anemia of chronic disease - stable on iron daily. Hgb 11.5  Chronic afib - rate controlled on coreg 3.125 mg daily. She takes ASA daily for anticoagulaiton.    Chronic respiratory failure - O2 dependent. stable   Allergic rhinitis - stable on flonase nightly and claritin 10 mg daily  Chronic pain due to osteoarthritis - stable on lidoderm to lower back daily; baclofen 5 mg nightly for leg spasticity    Depression - mood  stable on lexapro 10 mg daily   Chronic constipation - sx's exacerbated by sigmoid volvulus. takes colace 200 mg daily, linzess 145 mcg daily, senna twice daily and miralax daily   Glaucoma - uses trusopt to both eyes three times daily; travatan to both eyes nightly   Low back pain with radiculopathy - currently on neurontin 100 mg nightly for neuropathy   CKD - stage 3. Last Cr 1.2  Dementia - weight has improved by 4 lbs since last month. Appetite somewhat poor. Last albumin 3.9. She gets 2cal nutritional supplements. She takes vitamins and minerals. She does not have any medicine for cognitive impairment at this time  Past Medical History:  Diagnosis Date  . Altered mental status   . Anemia   . Atrial fibrillation ([place])   . Constipation 02/27/2015  . Coronary artery disease   . DEMENTIA   . Depression   . Diabetes mellitus   . Edema of lower extremity 07/13/11   right leg more swollen than left leg  . Encephalopathy   . Enlarged heart   . Esophageal dysmotility 07/02/12  . GERD (gastroesophageal reflux disease)   . History of adenomatous polyp of colon 06/24/99  . Horseshoe kidney   . Hyperlipemia   . Hypertension   . Pancreatic lesion 05/22/11   no further workup  per PCP/family due to age  . Parkinson disease ([place])   . Peripheral neuropathy ([facility])   . Sigmoid volvulus ([place]) 05/24/2013  . Thrombophlebitis   .  TIA (transient ischemic attack) 12/19/2012    Past Surgical History:  Procedure Laterality Date  . ABDOMINAL HYSTERECTOMY    . BREAST SURGERY     2 benign tumors removed left breast  . CHOLECYSTECTOMY    . ESOPHAGEAL DILATION     several times by Dr. [name]  . ESOPHAGOGASTRODUODENOSCOPY (EGD) WITH ESOPHAGEAL DILATION N/A 08/01/2012   Procedure: ESOPHAGOGASTRODUODENOSCOPY (EGD) WITH ESOPHAGEAL DILATION;  Surgeon: [name], MD;  Location: [facility];  Service: Endoscopy;  Laterality: N/A;  with c-arm savory dilators  . ESOPHAGOGASTRODUODENOSCOPY (EGD)  WITH PROPOFOL N/A 03/12/2015   Procedure: ESOPHAGOGASTRODUODENOSCOPY (EGD) WITH PROPOFOL;  Surgeon: [name], MD;  Location: [facility];  Service: Endoscopy;  Laterality: N/A;  . FLEXIBLE SIGMOIDOSCOPY N/A 05/24/2013   Procedure: FLEXIBLE SIGMOIDOSCOPY;  Surgeon: [name], MD;  Location: [facility];  Service: Endoscopy;  Laterality: N/A;  . FLEXIBLE SIGMOIDOSCOPY N/A 05/26/2013   Procedure:  flex with decompression of sigmoid volvulus;  Surgeon: [name], MD;  Location: [facility];  Service: Endoscopy;  Laterality: N/A;  . FLEXIBLE SIGMOIDOSCOPY N/A 12/20/2015   Procedure: FLEXIBLE SIGMOIDOSCOPY;  Surgeon: Milus Banister, MD;  Location: [facility];  Service: Endoscopy;  Laterality: N/A;  . FOOT SURGERY     benign tumors from foot  . NOSE SURGERY    . SHOULDER SURGERY  2001   left clavicle excision and acromioplasty  . TOTAL HIP ARTHROPLASTY      Patient Care Team: [name], DO as PCP - General (Internal Medicine) Gerlene Fee, NP as Nurse Practitioner (Nurse Practitioner)  Social History   Social History  . Marital status: Widowed    Spouse name: N/A  . Number of children: 5  . Years of education: 10th   Occupational History  . Retired    Social History Main Topics  . Smoking status: Former Smoker    Quit date: 10/09/1964  . Smokeless tobacco: Never Used  . Alcohol use No  . Drug use: No  . Sexual activity: No   Other Topics Concern  . Not on file   Social History Narrative   Pt [facility] at [facility] and [place].   Caffeine Use: very small amount daily     reports that she quit smoking about 51 years ago. She has never used smokeless tobacco. She reports that she does not drink alcohol or use drugs.  Family History  Problem Relation Age of Onset  . Cancer    . Heart disease     Family Status  Relation Status  . Mother Deceased at age 21   blood clots  . Father Deceased at age 41   cirrohis of the liver  .       Immunization History  Administered Date(s) Administered  . Influenza-Unspecified 07/13/2015  . PPD Test 07/28/2013    Allergies  Allergen Reactions  . Aspirin Other (See Comments)    G.I. Upset only    Medications: Patient's Medications  New Prescriptions   No medications on file  Previous Medications   ACETAMINOPHEN (TYLENOL) 325 MG TABLET    Take 2 tablets (650 mg total) by mouth every 6 (six) hours as needed for mild pain.   AMLODIPINE (NORVASC) 2.5 MG TABLET    Take 2.5 mg by mouth every morning. Hold for BP < 100/60 or HR < 60   ASPIRIN EC 81 MG TABLET    Take 81 mg by mouth daily.   BACLOFEN (LIORESAL) 10 MG TABLET  Take 5 mg by mouth at bedtime.   BIOTIN 5 MG/ML LIQD    Take 5 mLs by mouth 2 (two) times daily.    CARVEDILOL (COREG) 3.125 MG TABLET    Take 3.125 mg by mouth daily.   CHOLECALCIFEROL (VITAMIN D) 1000 UNITS TABLET    Take 1,000 Units by mouth every morning.    CLOTRIMAZOLE (LOTRIMIN) 1 % CREAM    Apply 1 application topically 2 (two) times daily. Apply to buttocks , labia topically every shift for rash then apply to buttocks, labia in the evening for rash.   CRANBERRY 475 MG CAPS    Take 1 capsule by mouth 2 (two) times daily.    DOCUSATE SODIUM (COLACE) 100 MG CAPSULE    Take 200 mg by mouth at bedtime.    DORZOLAMIDE (TRUSOPT) 2 % OPHTHALMIC SOLUTION    Place 1 drop into both eyes 3 (three) times daily. Reported on 11/16/2015        0800,1400,2000   ESCITALOPRAM (LEXAPRO) 10 MG TABLET    Take 1 tablet (10 mg total) by mouth daily.   FERROUS SULFATE 325 (65 FE) MG TABLET    Take 325 mg by mouth daily with breakfast.   FLUTICASONE (FLONASE) 50 MCG/ACT NASAL SPRAY    Place 2 sprays into both nostrils at bedtime.   FUROSEMIDE (LASIX) 40 MG TABLET    Take 1 tablet (40 mg total) by mouth daily.   GABAPENTIN (NEURONTIN) 100 MG CAPSULE    Take 100 mg by mouth at bedtime.   LIDOCAINE (LIDODERM) 5 %    Place 1 patch onto the skin daily. Apply 1 patch to left lower  back/hip every morning.  Remove & Discard patch within 12 hours or as directed by MD   LINACLOTIDE (LINZESS) 145 MCG CAPS CAPSULE    Take 145 mcg by mouth daily before breakfast.    LORATADINE (CLARITIN) 10 MG TABLET    Take 10 mg by mouth daily before breakfast.    MULTIPLE VITAMINS-MINERALS (CERTAGEN PO)    Take 1 tablet by mouth daily.    NUTRITIONAL SUPPLEMENTS (TWOCAL HN) LIQD    Take 90 mLs by mouth 3 (three) times daily.   ONDANSETRON (ZOFRAN) 8 MG TABLET    Take 8 mg by mouth every 8 (eight) hours as needed for nausea or vomiting.   OXYGEN    Inhale 2 L into the lungs continuous.   POLYETHYLENE GLYCOL (MIRALAX / GLYCOLAX) PACKET    Take 17 g by mouth daily at 6 (six) AM.    POTASSIUM CHLORIDE SA (K-DUR,KLOR-CON) 20 MEQ TABLET    Take 40 mEq by mouth 2 (two) times daily.    PROTONIX 40 MG PACK    Take 40 mg by mouth daily at 6 (six) AM.   SENNA (SENOKOT) 8.6 MG TABS TABLET    Take 1 tablet (8.6 mg total) by mouth 2 (two) times daily.   SKIN PROTECTANTS, MISC. (CALAZIME SKIN PROTECTANT EX)    Apply 1 application topically as directed. Cleanse buttocks and apply paste daily and as needed every day and night shift for protection.   SODIUM CHLORIDE (OCEAN) 0.65 % SOLN NASAL SPRAY    Place 1 spray into both nostrils as directed. Four times daily And every 24 hours as needed to moisturize nasal passages.   TRAVOPROST, BAK FREE, (TRAVATAN) 0.004 % SOLN OPHTHALMIC SOLUTION    Place 1 drop into both eyes daily at 8 pm.    VITAMIN B-12 (CYANOCOBALAMIN) 500 MCG  TABLET    Take 500 mcg by mouth every morning.   Modified Medications   No medications on file  Discontinued Medications   ALUM & MAG HYDROXIDE-SIMETH (MAALOX/MYLANTA) 200-200-20 MG/5ML SUSPENSION    Take 15 mLs by mouth once.    Review of Systems  Unable to perform ROS: Dementia    Vitals:   01/13/16 1435  BP: 119/67  Pulse: 73  Resp: 18  Temp: 97.4 F (36.3 C)  TempSrc: Oral  SpO2: 97%  Weight: 150 lb (68 kg)  Height: _0   (1.702 m)   Body mass index is 23.49 kg/m.  Physical Exam  Constitutional: She appears well-developed and well-nourished.  Frail appearing in NAD. [place] O2 intact  HENT:  Mouth/Throat: Oropharynx is clear and moist. No oropharyngeal exudate.  Eyes: Pupils are equal, round, and reactive to light. No scleral icterus.  Neck: Neck supple. Carotid bruit is not present. No tracheal deviation present. No thyromegaly present.  Cardiovascular: Normal rate, regular rhythm and intact distal pulses.  Exam reveals no gallop and no friction rub.   Murmur (1/6 SEM) heard. No LE edema b/l. No calf TTP  Pulmonary/Chest: Effort normal and breath sounds normal. No stridor. No respiratory distress. She has no wheezes. She has no rales.  Abdominal: Soft. Bowel sounds are normal. She exhibits no distension and no mass. There is no hepatomegaly. There is no tenderness. There is no rebound and no guarding.  Musculoskeletal: She exhibits edema.  Lymphadenopathy:    She has no cervical adenopathy.  Neurological: She is alert.  Skin: Skin is warm and dry. No rash noted.  Psychiatric: She has a normal mood and affect. Her behavior is normal.     Labs reviewed: Erroneous Encounter on 01/05/2016  Component Date Value Ref Range Status  . Hemoglobin 01/04/2016 10.9* 12.0 - 16.0 g/dL Final  . HCT 01/04/2016 32* 36 - 46 % Final  . Platelets 01/04/2016 175  150 - 399 K/L Final  . WBC 01/04/2016 4.1  10^3/mL Final  . Glucose 01/04/2016 121  mg/dL Final  . BUN 01/04/2016 13  4 - 21 mg/dL Final  . Creatinine 01/04/2016 1.2* 0.5 - 1.1 mg/dL Final  . Potassium 01/04/2016 3.8  3.4 - 5.3 mmol/L Final  . Sodium 01/04/2016 144  137 - 147 mmol/L Final  . Alkaline Phosphatase 01/04/2016 91  25 - 125 U/L Final  . ALT 01/04/2016 9  7 - 35 U/L Final  . AST 01/04/2016 15  13 - 35 U/L Final  . Bilirubin, Total 01/04/2016 0.3  mg/dL Final  Nursing Home on 12/27/2015  Component Date Value Ref Range Status  . Hemoglobin  12/21/2015 9.8* 12.0 - 16.0 g/dL Final  . HCT 12/21/2015 30* 36 - 46 % Final  . Platelets 12/21/2015 98* 150 - 399 K/L Final  . WBC 12/21/2015 3.9  10^3/mL Final  . WBC 12/22/2015 5.1  10^3/mL Final  . Glucose 12/22/2015 125  mg/dL Final  . BUN 12/22/2015 18  4 - 21 mg/dL Final  . Creatinine 12/22/2015 1.1  0.5 - 1.1 mg/dL Final  . Sodium 12/22/2015 142  137 - 147 mmol/L Final  . Bilirubin, Total 12/22/2015 0.8  mg/dL Final  Admission on 12/20/2015, Discharged on 12/23/2015  Component Date Value Ref Range Status  . Lipase 12/20/2015 30  11 - 51 U/L Final  . Sodium 12/20/2015 138  135 - 145 mmol/L Final  . Potassium 12/20/2015 3.2* 3.5 - 5.1 mmol/L Final  . Chloride 12/20/2015 111  101 - 111 mmol/L Final  . CO2 12/20/2015 21* 22 - 32 mmol/L Final  . Glucose, Bld 12/20/2015 138* 65 - 99 mg/dL Final  . BUN 12/20/2015 31* 6 - 20 mg/dL Final  . Creatinine, Ser 12/20/2015 1.61* 0.44 - 1.00 mg/dL Final  . Calcium 12/20/2015 8.9  8.9 - 10.3 mg/dL Final  . Total Protein 12/20/2015 7.1  6.5 - 8.1 g/dL Final  . Albumin 12/20/2015 3.9  3.5 - 5.0 g/dL Final  . AST 12/20/2015 22  15 - 41 U/L Final  . ALT 12/20/2015 13* 14 - 54 U/L Final  . Alkaline Phosphatase 12/20/2015 95  38 - 126 U/L Final  . Total Bilirubin 12/20/2015 0.8  0.3 - 1.2 mg/dL Final  . GFR calc non Af Amer 12/20/2015 27* >60 mL/min Final  . GFR calc Af Amer 12/20/2015 32* >60 mL/min Final   Comment: (NOTE) The eGFR has been calculated using the CKD EPI equation. This calculation has not been validated in all clinical situations. eGFR's persistently <60 mL/min signify possible Chronic Kidney Disease.   . Anion gap 12/20/2015 6  5 - 15 Final  . WBC 12/20/2015 5.2  4.0 - 10.5 K/uL Final  . RBC 12/20/2015 3.20* 3.87 - 5.11 MIL/uL Final  . Hemoglobin 12/20/2015 11.0* 12.0 - 15.0 g/dL Final  . HCT 12/20/2015 32.4* 36.0 - 46.0 % Final  . MCV 12/20/2015 101.3* 78.0 - 100.0 fL Final  . MCH 12/20/2015 34.4* 26.0 - 34.0 pg Final  .  MCHC 12/20/2015 34.0  30.0 - 36.0 g/dL Final  . RDW 12/20/2015 13.8  11.5 - 15.5 % Final  . Platelets 12/20/2015 107* 150 - 400 K/uL Final   Comment: SPECIMEN CHECKED FOR CLOTS REPEATED TO VERIFY PLATELET COUNT CONFIRMED BY SMEAR   . Color, Urine 12/20/2015 YELLOW  YELLOW Final  . APPearance 12/20/2015 TURBID* CLEAR Final  . Specific Gravity, Urine 12/20/2015 1.013  1.005 - 1.030 Final  . pH 12/20/2015 5.5  5.0 - 8.0 Final  . Glucose, UA 12/20/2015 NEGATIVE  NEGATIVE mg/dL Final  . Hgb urine dipstick 12/20/2015 MODERATE* NEGATIVE Final  . Bilirubin Urine 12/20/2015 NEGATIVE  NEGATIVE Final  . Ketones, ur 12/20/2015 NEGATIVE  NEGATIVE mg/dL Final  . Protein, ur 12/20/2015 30* NEGATIVE mg/dL Final  . Nitrite 12/20/2015 POSITIVE* NEGATIVE Final  . Leukocytes, UA 12/20/2015 LARGE* NEGATIVE Final  . Lactic Acid, Venous 12/20/2015 0.85  0.5 - 1.9 mmol/L Final  . Lactic Acid, Venous 12/20/2015 0.65  0.5 - 1.9 mmol/L Final  . Squamous Epithelial / LPF 12/20/2015 0-5* NONE SEEN Final  . WBC, UA 12/20/2015 TOO NUMEROUS TO COUNT  0 - 5 WBC/hpf Final  . RBC / HPF 12/20/2015 6-30  0 - 5 RBC/hpf Final  . Bacteria, UA 12/20/2015 MANY* NONE SEEN Final  . Specimen Description 12/22/2015 URINE, CLEAN CATCH   Final  . Special Requests 12/22/2015 NONE   Final  . Culture 12/22/2015 >=100,000 COLONIES/mL ESCHERICHIA COLI*  Final  . Report Status 12/22/2015 12/22/2015 FINAL   Final  . Organism ID, Bacteria 12/22/2015 ESCHERICHIA COLI*  Final  . WBC 12/21/2015 3.9* 4.0 - 10.5 K/uL Final  . RBC 12/21/2015 2.88* 3.87 - 5.11 MIL/uL Final  . Hemoglobin 12/21/2015 9.8* 12.0 - 15.0 g/dL Final  . HCT 12/21/2015 29.7* 36.0 - 46.0 % Final  . MCV 12/21/2015 103.1* 78.0 - 100.0 fL Final  . MCH 12/21/2015 34.0  26.0 - 34.0 pg Final  . MCHC 12/21/2015 33.0  30.0 - 36.0 g/dL Final  .  RDW 12/21/2015 14.3  11.5 - 15.5 % Final  . Platelets 12/21/2015 98* 150 - 400 K/uL Final  . Sodium 12/21/2015 143  135 - 145 mmol/L  Final  . Potassium 12/21/2015 3.2* 3.5 - 5.1 mmol/L Final  . Chloride 12/21/2015 117* 101 - 111 mmol/L Final  . CO2 12/21/2015 22  22 - 32 mmol/L Final  . Glucose, Bld 12/21/2015 108* 65 - 99 mg/dL Final  . BUN 12/21/2015 23* 6 - 20 mg/dL Final  . Creatinine, Ser 12/21/2015 1.15* 0.44 - 1.00 mg/dL Final  . Calcium 12/21/2015 8.4* 8.9 - 10.3 mg/dL Final  . GFR calc non Af Amer 12/21/2015 41* >60 mL/min Final  . GFR calc Af Amer 12/21/2015 47* >60 mL/min Final   Comment: (NOTE) The eGFR has been calculated using the CKD EPI equation. This calculation has not been validated in all clinical situations. eGFR's persistently <60 mL/min signify possible Chronic Kidney Disease.   . Anion gap 12/21/2015 4* 5 - 15 Final  . Magnesium 12/21/2015 2.4  1.7 - 2.4 mg/dL Final  . MRSA by PCR 12/20/2015 NEGATIVE  NEGATIVE Final   Comment:        The GeneXpert MRSA Assay (FDA approved for NASAL specimens only), is one component of a comprehensive MRSA colonization surveillance program. It is not intended to diagnose MRSA infection nor to guide or monitor treatment for MRSA infections.   . Sodium 12/22/2015 142  135 - 145 mmol/L Final  . Potassium 12/22/2015 3.9  3.5 - 5.1 mmol/L Final   Comment: DELTA CHECK NOTED REPEATED TO VERIFY NO VISIBLE HEMOLYSIS   . Chloride 12/22/2015 117* 101 - 111 mmol/L Final  . CO2 12/22/2015 20* 22 - 32 mmol/L Final  . Glucose, Bld 12/22/2015 125* 65 - 99 mg/dL Final  . BUN 12/22/2015 18  6 - 20 mg/dL Final  . Creatinine, Ser 12/22/2015 1.09* 0.44 - 1.00 mg/dL Final  . Calcium 12/22/2015 8.7* 8.9 - 10.3 mg/dL Final  . GFR calc non Af Amer 12/22/2015 44* >60 mL/min Final  . GFR calc Af Amer 12/22/2015 51* >60 mL/min Final   Comment: (NOTE) The eGFR has been calculated using the CKD EPI equation. This calculation has not been validated in all clinical situations. eGFR's persistently <60 mL/min signify possible Chronic Kidney Disease.   . Anion gap 12/22/2015  5  5 - 15 Final  . WBC 12/22/2015 5.1  4.0 - 10.5 K/uL Final  . RBC 12/22/2015 3.13* 3.87 - 5.11 MIL/uL Final  . Hemoglobin 12/22/2015 10.8* 12.0 - 15.0 g/dL Final  . HCT 12/22/2015 32.3* 36.0 - 46.0 % Final  . MCV 12/22/2015 103.2* 78.0 - 100.0 fL Final  . MCH 12/22/2015 34.5* 26.0 - 34.0 pg Final  . MCHC 12/22/2015 33.4  30.0 - 36.0 g/dL Final  . RDW 12/22/2015 14.5  11.5 - 15.5 % Final  . Platelets 12/22/2015 101* 150 - 400 K/uL Final  Nursing Home on 10/28/2015  Component Date Value Ref Range Status  . Hemoglobin 10/14/2015 11.5* 12.0 - 16.0 g/dL Final  . HCT 10/14/2015 34* 36 - 46 % Final  . Platelets 10/14/2015 127* 150 - 399 K/L Final  . WBC 10/14/2015 5.0  10^3/mL Final  . Glucose 10/14/2015 132  mg/dL Final  . BUN 10/14/2015 16  4 - 21 mg/dL Final  . Creatinine 10/14/2015 1.3* 0.5 - 1.1 mg/dL Final  . Potassium 10/14/2015 3.8  3.4 - 5.3 mmol/L Final  . Sodium 10/14/2015 143  137 - 147 mmol/L  Final  . Alkaline Phosphatase 10/14/2015 104  25 - 125 U/L Final  . ALT 10/14/2015 11  7 - 35 U/L Final  . AST 10/14/2015 16  13 - 35 U/L Final  . Bilirubin, Total 10/14/2015 0.4  mg/dL Final    Ct Abdomen Pelvis Wo Contrast  Result Date: 12/20/2015 CLINICAL DATA:  Abdominal pain EXAM: CT ABDOMEN AND PELVIS WITHOUT CONTRAST TECHNIQUE: Multidetector CT imaging of the abdomen and pelvis was performed following the standard protocol without IV contrast. COMPARISON:  03/09/2015 FINDINGS: Lower chest and abdominal wall: Cardiomegaly. Extensive coronary and aortic atherosclerosis. Hepatobiliary: No focal liver abnormality.Cholecystectomy. Pancreas: Chronic cystic change in the pancreatic body without appreciable change compared to prior. This area continues to measure 24 mm in length. Continued stability suggests benign process. Spleen: Unremarkable. Adrenals/Urinary Tract: Negative adrenals.Horseshoe kidney with renal atrophy. No hydronephrosis or stone. Unremarkable bladder. Stomach/Bowel:  Distended colon above the distal sigmoid which shows mesenteric and bowel twisting. The twist is relatively loose but still causes transition point. Fluid seen in the rectum. Colonic distention is improved compared to radiography earlier today with distal sigmoid measuring 9 cm in maximal diameter compared at 13 cm previously. Uncomplicated few diverticula. Reproductive:Hysterectomy. Vascular/Lymphatic: No acute vascular abnormality. Extensive atherosclerotic calcification. No mass or adenopathy. Other: Small left pelvic ascites, presumably reactive. Musculoskeletal: No acute finding.  Total left hip arthroplasty. IMPRESSION: 1. Loosely twisted sigmoid volvulus with transition point. Proximal distention is improved compared to radiography earlier today suggesting partial interval decompression. 2. Chronic findings are stable and described above. Electronically Signed   By: Monte Fantasia M.D.   On: 12/20/2015 06:37   Dg Abd Acute W/chest  Result Date: 12/20/2015 CLINICAL DATA:  Ileus. EXAM: DG ABDOMEN ACUTE W/ 1V CHEST COMPARISON:  [facility] March 11, 2015 and chest x-ray January 05, 2014 FINDINGS: Stable cardiomegaly. No pneumothorax. Mild increased interstitial opacities suggest mild edema. No free air, portal venous gas, or pneumatosis. Again noted are dilated loops of colon without definitive gas in the rectum. Overall, colonic distention has mildly worsened in the interval measuring up to 12.6 cm today versus 11 cm previously. IMPRESSION: 1. Markedly distended loops of colon without definitive gas in the rectum. Given the similar pattern on previous studies and history, this likely represents chronic colonic ileus. It would be very difficult to exclude a volvulus on this study alone and if there is concern, a CT scan could better evaluate. Electronically Signed   By: Dorise Bullion III M.D   On: 12/20/2015 03:07     Assessment/Plan   ICD-9-CM ICD-10-CM   1. Dementia, without behavioral disturbance 294.20  F03.90   2. Type 2 diabetes mellitus with diabetic polyneuropathy, without long-term current use of insulin (HCC) 250.60 E11.42    357.2    3. Diastolic CHF, chronic (HCC) 428.32 I50.32    428.0    4. Chronic respiratory failure with hypoxia (HCC) 518.83 J96.11    799.02    5. Chronic atrial fibrillation (HCC) 427.31 I48.2   6. Gastroesophageal reflux disease without esophagitis 530.81 K21.9   7. Primary osteoarthritis involving multiple joints 715.09 M15.0   8. Anemia of chronic disease 285.29 D63.8   9. Benign essential HTN 401.1 I10     Start 2 cal nutritional supplement if not taking already  Increase gabapentin 211m qhs for neuropathy  Cont other meds as ordered  PT/OT/ST as indicated  Will follow  Meghan Warshawsky S. CFlossie BuffySenior Care and Adult Medicine  [place], [place] [address] [phone] Cell (Monday-Friday 8 AM - 5 PM) [phone] After 5 PM and follow prompts

## 2016-02-08 ENCOUNTER — Other Ambulatory Visit: Payer: Self-pay | Admitting: *Deleted

## 2016-02-08 MED ORDER — LORAZEPAM 1 MG PO TABS
ORAL_TABLET | ORAL | 0 refills | Status: DC
Start: 2016-02-08 — End: 2016-03-01

## 2016-02-08 NOTE — Telephone Encounter (Signed)
[facility] #[phone] Fax:[phone]  

## 2016-02-10 DIAGNOSIS — E119 Type 2 diabetes mellitus without complications: Secondary | ICD-10-CM | POA: Diagnosis not present

## 2016-02-10 DIAGNOSIS — H01009 Unspecified blepharitis unspecified eye, unspecified eyelid: Secondary | ICD-10-CM | POA: Diagnosis not present

## 2016-02-10 DIAGNOSIS — H04123 Dry eye syndrome of bilateral lacrimal glands: Secondary | ICD-10-CM | POA: Diagnosis not present

## 2016-02-10 DIAGNOSIS — H401134 Primary open-angle glaucoma, bilateral, indeterminate stage: Secondary | ICD-10-CM | POA: Diagnosis not present

## 2016-02-15 LAB — HEPATIC FUNCTION PANEL
ALK PHOS: 107 U/L (ref 25–125)
ALT: 11 U/L (ref 7–35)
AST: 20 U/L (ref 13–35)
BILIRUBIN, TOTAL: 0.4 mg/dL

## 2016-02-15 LAB — BASIC METABOLIC PANEL
BUN: 19 mg/dL (ref 4–21)
Creatinine: 1.5 mg/dL — AB (ref 0.5–1.1)
Glucose: 160 mg/dL
Potassium: 4.5 mmol/L (ref 3.4–5.3)
SODIUM: 143 mmol/L (ref 137–147)

## 2016-02-15 LAB — CBC AND DIFFERENTIAL
HEMATOCRIT: 36 % (ref 36–46)
HEMOGLOBIN: 11.3 g/dL — AB (ref 12.0–16.0)
Platelets: 128 10*3/uL — AB (ref 150–399)
WBC: 4.6 10^3/mL

## 2016-02-18 ENCOUNTER — Non-Acute Institutional Stay (SKILLED_NURSING_FACILITY): Payer: Medicare Other | Admitting: Internal Medicine

## 2016-02-18 DIAGNOSIS — N289 Disorder of kidney and ureter, unspecified: Secondary | ICD-10-CM | POA: Diagnosis not present

## 2016-02-18 DIAGNOSIS — I5032 Chronic diastolic (congestive) heart failure: Secondary | ICD-10-CM

## 2016-02-18 DIAGNOSIS — B3749 Other urogenital candidiasis: Secondary | ICD-10-CM | POA: Diagnosis not present

## 2016-02-22 ENCOUNTER — Encounter: Payer: Self-pay | Admitting: Internal Medicine

## 2016-02-22 ENCOUNTER — Non-Acute Institutional Stay (SKILLED_NURSING_FACILITY): Payer: Medicare Other | Admitting: Internal Medicine

## 2016-02-22 DIAGNOSIS — M542 Cervicalgia: Secondary | ICD-10-CM | POA: Diagnosis not present

## 2016-02-22 DIAGNOSIS — I5032 Chronic diastolic (congestive) heart failure: Secondary | ICD-10-CM | POA: Diagnosis not present

## 2016-02-22 DIAGNOSIS — N289 Disorder of kidney and ureter, unspecified: Secondary | ICD-10-CM

## 2016-02-22 NOTE — Progress Notes (Signed)
Patient ID: Maria Christian, female   DOB: [birth year], 82 y.o.   MRN: [id number]   Location:  [facility] Room Number: [id number] Place of Service:  SNF (31) Provider:  Granville Lewis, PA-C  [name], DO  Patient Care Team: [name], DO as PCP - General (Internal Medicine) Gerlene Fee, NP as Nurse Practitioner (Nurse Practitioner)  Extended Emergency Contact Information Primary Emergency Contact: [place] Phone: [phone] Relation: Son Secondary Emergency Contact: Roberts,Lynn Address: [address]          [place],  [address] [name] of [place] Phone: [phone] Mobile Phone: [phone] Relation: Daughter  Code Status:  DNR Goals of care: Advanced Directive information Advanced Directives 02/22/2016  Does patient have an advance directive? Yes  Type of Advance Directive Out of facility DNR (pink MOST or yellow form)  Does patient want to make changes to advanced directive? No - Patient declined  Copy of advanced directive(s) in chart? Yes  Would patient like information on creating an advanced directive? -  Pre-existing out of facility DNR order (yellow form or pink MOST form) -     Chief Complaint  Patient presents with  . Acute Visit    Renal Insufficiency    HPI:  Pt is a 82 y.o. female seen today for an acute visit for  folowup of renal insufficiency.  Was noted last week her creatinine was slightly up from her baseline at 1.45-she does have a history of chronic diastolic CHF and was on Demadex 20 mg a day as well as aggressive potassium supplementation with a history of hypokalemia.  We held her Demadex a couple days and reduced her potassium dose in did order an updated lab over the weekend which shows creatinine actually has gone up to 1.7 to BUN of 28.7 potassium is normal at 4.5.  She states she is eating and drinking fairly well .  She is also complaining of neck pain  apparently this is been going on for several weeks the way she describes it it is intermittent and says at times it feels like it radiates into her head and give 6 somewhat tingly feel.  She has recently completed antibiotic for ESBL urinary tract infection she is not really complaining of dysuria   Past Medical History:  Diagnosis Date  . Altered mental status   . Anemia   . Atrial fibrillation ([place])   . Constipation 02/27/2015  . Coronary artery disease   . DEMENTIA   . Depression   . Diabetes mellitus   . Edema of lower extremity 07/13/11   right leg more swollen than left leg  . Encephalopathy   . Enlarged heart   . Esophageal dysmotility 07/02/12  . GERD (gastroesophageal reflux disease)   . History of adenomatous polyp of colon 06/24/99  . Horseshoe kidney   . Hyperlipemia   . Hypertension   . Pancreatic lesion 05/22/11   no further workup  per PCP/family due to age  . Parkinson disease ([place])   . Peripheral neuropathy ([place])   . Sigmoid volvulus ([place]) 05/24/2013  . Thrombophlebitis   . TIA (transient ischemic attack) 12/19/2012   Past Surgical History:  Procedure Laterality Date  . ABDOMINAL HYSTERECTOMY    . BREAST SURGERY     2 benign tumors removed left breast  . CHOLECYSTECTOMY    . ESOPHAGEAL DILATION     several times by Dr. [name]  . ESOPHAGOGASTRODUODENOSCOPY (EGD) WITH  ESOPHAGEAL DILATION N/A 08/01/2012   Procedure: ESOPHAGOGASTRODUODENOSCOPY (EGD) WITH ESOPHAGEAL DILATION;  Surgeon: [name], MD;  Location: [facility];  Service: Endoscopy;  Laterality: N/A;  with c-arm savory dilators  . ESOPHAGOGASTRODUODENOSCOPY (EGD) WITH PROPOFOL N/A 03/12/2015   Procedure: ESOPHAGOGASTRODUODENOSCOPY (EGD) WITH PROPOFOL;  Surgeon: [name], MD;  Location: [facility];  Service: Endoscopy;  Laterality: N/A;  . FLEXIBLE SIGMOIDOSCOPY N/A 05/24/2013   Procedure: FLEXIBLE SIGMOIDOSCOPY;  Surgeon: [name], MD;  Location: [facility];  Service: Endoscopy;   Laterality: N/A;  . FLEXIBLE SIGMOIDOSCOPY N/A 05/26/2013   Procedure:  flex with decompression of sigmoid volvulus;  Surgeon: [name], MD;  Location: [facility];  Service: Endoscopy;  Laterality: N/A;  . FLEXIBLE SIGMOIDOSCOPY N/A 12/20/2015   Procedure: FLEXIBLE SIGMOIDOSCOPY;  Surgeon: Milus Banister, MD;  Location: [facility];  Service: Endoscopy;  Laterality: N/A;  . FOOT SURGERY     benign tumors from foot  . NOSE SURGERY    . SHOULDER SURGERY  2001   left clavicle excision and acromioplasty  . TOTAL HIP ARTHROPLASTY      Allergies  Allergen Reactions  . Aspirin Other (See Comments)    G.I. Upset only      Medication List       Accurate as of 02/22/16  2:26 PM. Always use your most recent med list.          acetaminophen 325 MG tablet Commonly known as:  TYLENOL Take 2 tablets (650 mg total) by mouth every 6 (six) hours as needed for mild pain.   amLODipine 2.5 MG tablet Commonly known as:  NORVASC Take 2.5 mg by mouth every morning. Hold for BP < 100/60 or HR < 60   aspirin EC 81 MG tablet Take 81 mg by mouth daily.   baclofen 10 MG tablet Commonly known as:  LIORESAL Take 5 mg by mouth at bedtime.   Biotin 5 MG/ML Liqd Take 5 mLs by mouth 2 (two) times daily.   CALAZIME SKIN PROTECTANT EX Apply 1 application topically as directed. Cleanse buttocks and apply paste daily and as needed every day and night shift for protection.   carvedilol 3.125 MG tablet Commonly known as:  COREG Take 3.125 mg by mouth daily.   CERTAGEN PO Take 1 tablet by mouth daily.   cholecalciferol 1000 units tablet Commonly known as:  VITAMIN D Take 1,000 Units by mouth every morning.   clotrimazole 1 % cream Commonly known as:  LOTRIMIN Apply 1 application topically 2 (two) times daily. Apply to buttocks , labia topically every shift for rash then apply to buttocks, labia in the evening for rash.   Cranberry 475 MG Caps Take 1 capsule by mouth 2 (two) times  daily.   docusate sodium 100 MG capsule Commonly known as:  COLACE Take 200 mg by mouth at bedtime.   dorzolamide 2 % ophthalmic solution Commonly known as:  TRUSOPT Place 1 drop into both eyes 3 (three) times daily. Reported on 11/16/2015        0800,1400,2000   escitalopram 10 MG tablet Commonly known as:  LEXAPRO Take 1 tablet (10 mg total) by mouth daily.   ferrous sulfate 325 (65 FE) MG tablet Take 325 mg by mouth daily with breakfast.   fluticasone 50 MCG/ACT nasal spray Commonly known as:  FLONASE Place 2 sprays into both nostrils at bedtime.   gabapentin 100 MG capsule Commonly known as:  NEURONTIN Take 100 mg by mouth at bedtime.  lidocaine 5 % Commonly known as:  LIDODERM Place 1 patch onto the skin daily. Apply 1 patch to left lower back/hip every morning.  Remove & Discard patch within 12 hours or as directed by MD   LINZESS 145 MCG Caps capsule Generic drug:  linaclotide Take 145 mcg by mouth daily before breakfast.   loratadine 10 MG tablet Commonly known as:  CLARITIN Take 10 mg by mouth daily before breakfast.   LORazepam 1 MG tablet Commonly known as:  ATIVAN Take one tablet by mouth one dose   nystatin powder Generic drug:  nystatin Apply 100,000 g topically daily.   ondansetron 8 MG tablet Commonly known as:  ZOFRAN Take 8 mg by mouth every 8 (eight) hours as needed for nausea or vomiting.   OXYGEN Inhale 2 L into the lungs continuous.   polyethylene glycol packet Commonly known as:  MIRALAX / GLYCOLAX Take 17 g by mouth daily at 6 (six) AM.   potassium chloride SA 20 MEQ tablet Commonly known as:  K-DUR,KLOR-CON Take 40 mEq by mouth 2 (two) times daily.   PROTONIX 40 mg/20 mL Pack Generic drug:  pantoprazole sodium Take 40 mg by mouth daily at 6 (six) AM.   saccharomyces boulardii 250 MG capsule Commonly known as:  FLORASTOR Take 250 mg by mouth 2 (two) times daily.   senna 8.6 MG Tabs tablet Commonly known as:  SENOKOT Take 1  tablet (8.6 mg total) by mouth 2 (two) times daily.   sodium chloride 0.65 % Soln nasal spray Commonly known as:  OCEAN Place 1 spray into both nostrils as directed. Four times daily And every 24 hours as needed to moisturize nasal passages.   torsemide 20 MG tablet Commonly known as:  DEMADEX Take 20 mg by mouth daily.   Travoprost (BAK Free) 0.004 % Soln ophthalmic solution Commonly known as:  TRAVATAN Place 1 drop into both eyes daily at 8 pm.   TWOCAL HN Liqd Take 90 mLs by mouth 3 (three) times daily.   vitamin B-12 500 MCG tablet Commonly known as:  CYANOCOBALAMIN Take 500 mcg by mouth every morning.       Review of Systems   Somewhat limited secondary to dementia but she is not really complaining of any shortness of breath increased edema chest pain or palpitations she does complain of some neck pain as noted above  Immunization History  Administered Date(s) Administered  . Influenza-Unspecified 07/13/2015  . PPD Test 07/28/2013   Pertinent  Health Maintenance Due  Topic Date Due  . URINE MICROALBUMIN  10/08/1936  . DEXA SCAN  10/09/1991  . HEMOGLOBIN A1C  01/15/2015  . INFLUENZA VACCINE  01/04/2016  . PNA vac Low Risk Adult (1 of 2 - PCV13) 11/15/2016 (Originally 10/09/1991)  . OPHTHALMOLOGY EXAM  09/20/2016  . FOOT EXAM  10/04/2016   No flowsheet data found. Functional Status Survey:    Vitals:   02/22/16 1407  BP: 128/71  Pulse: 67  Resp: 20  Temp: 97.1 F (36.2 C)  TempSrc: Oral  SpO2: 96%  Weight: 151 lb (68.5 kg)  Height: 5\' 7"  (1.702 m)   Body mass index is 23.65 kg/m. Physical Exam Constitutional: She appears well-developedand well-nourished.  Frail appearing in NAD. [place] O2 intact HENT:  Mouth/Throat: Oropharynx is clear and moist. No oropharyngeal exudate.  Eyes: Pupils are equal, round, and reactive to light. No scleral icterus.  Neck: Neck supple. . No tracheal deviationpresent. No thyromegalypresent.  Cardiovascular: Normal rate,  regular rhythm with  occasional irregular beat Murmur(1/6 SEM)heard.  trace left lower extremity edema 1+ right lower extremity edema  pedalpulse is intact bilaterally Pulmonary/Chest: Effort normaland breath sounds normal. No stridor. No respiratory distress. She has no wheezes. She has no rales.  Abdominal: Soft. Bowel sounds are normal. She exhibits no distensionand no mass. There is no hepatomegaly. There is no tenderness. There is no reboundand no guarding.    Musculoskeletal: She exhibits edema. As noted above  There is minimal tenderness to palpation of her neck area I do not note any deformities she appears to have l range of motion of neck with possibly some discomfort but this does not appear to be in acute type pain  Lymphadenopathy:  She has no cervical adenopathy.  Neurological: She is alert. No lateralizing findings grip strength is strong bilaterally Skin: Skin is warmand dry. No rashnoted.  Psychiatric: She has a normal mood and affect. Her behavior is normal.   Labs reviewed:  02/19/2016.  Sodium 143 potassium 4.5 BUN 28.7 creatinine 1.7 to.  Last week creatinine was 1.45  Recent Labs  02/27/15 0547  03/13/15 1814  12/20/15 0130 12/21/15 0519 12/22/15 12/22/15 0543 01/04/16  NA 140  < >  --   < > 138 143 142 142 144  K 3.1*  < >  --   < > 3.2* 3.2*  --  3.9 3.8  CL 107  < >  --   < > 111 117*  --  117*  --   CO2 25  < >  --   < > 21* 22  --  20*  --   GLUCOSE 136*  < >  --   < > 138* 108*  --  125*  --   BUN 20  < >  --   < > 31* 23* 18 18 13   CREATININE 1.32*  < >  --   < > 1.61* 1.15* 1.1 1.09* 1.2*  CALCIUM 8.9  < >  --   < > 8.9 8.4*  --  8.7*  --   MG 2.0  --  1.9  --   --  2.4  --   --   --   PHOS 3.6  --   --   --   --   --   --   --   --   < > = values in this interval not displayed.  Recent Labs  03/11/15 1100 03/12/15 0525 10/14/15 12/20/15 0130 01/04/16  AST 16 12* 16 22 15   ALT 7* 6* 11 13* 9  ALKPHOS 60 49 104 95 91  BILITOT  0.3 0.6  --  0.8  --   PROT 6.5 5.6*  --  7.1  --   ALBUMIN 3.3* 2.8*  --  3.9  --     Recent Labs  02/26/15 1257  03/11/15 1100  04/19/15 1405  12/20/15 0130  12/21/15 0519 12/22/15 12/22/15 0543 01/04/16  WBC 5.5  < > 5.6  < > 4.3  < > 5.2  < > 3.9* 5.1 5.1 4.1  NEUTROABS 3.5  --  3.7  --  2.5  --   --   --   --   --   --   --   HGB 10.7*  < > 7.9*  < > 9.2*  < > 11.0*  < > 9.8*  --  10.8* 10.9*  HCT 32.1*  < > 23.4*  < > 28.2*  < > 32.4*  < >  29.7*  --  32.3* 32*  MCV 102.9*  < > 103.1*  < > 101.5*  --  101.3*  --  103.1*  --  103.2*  --   PLT 182  < > 203  < > 127.0*  < > 107*  < > 98*  --  101* 175  < > = values in this interval not displayed. Lab Results  Component Value Date   TSH 5.235 (H) 02/27/2015   Lab Results  Component Value Date   HGBA1C 6.1 (A) 07/17/2014   Lab Results  Component Value Date   CHOL 137 06/12/2014   HDL 53 06/12/2014   LDLCALC 68 06/12/2014   TRIG 83 06/12/2014   CHOLHDL 2.3 05/23/2011    Significant Diagnostic Results in last 30 days:  No results found.  Assessment/Plan  #1 renal insufficiency-this appears to be gradually worsening will DC Demadex for now and update a BMP tomorrow-medically she appears to be stable in regards to edema respiratory status but this will have to be watched.  Also will order a renal ultrasound this was discussed with Dr. Eulas Post via phone.  #2 history of neck discomfort will start by doing an x-ray did discuss this with her daughter at bedside as well-I suspect she may be a poor candidate for any aggressive workup but would like to start with an x-ray and go from there.  She does not appear to be in acute distress apparently this is a somewhat chronic issue. I do note she is on Neurontin at night-also has Tylenol  As needed  [phone]

## 2016-02-23 LAB — CBC AND DIFFERENTIAL
HEMATOCRIT: 31 % — AB (ref 36–46)
HEMOGLOBIN: 9.7 g/dL — AB (ref 12.0–16.0)
Platelets: 126 10*3/uL — AB (ref 150–399)
WBC: 3.5 10*3/mL

## 2016-02-23 LAB — BASIC METABOLIC PANEL
BUN: 26 mg/dL — AB (ref 4–21)
Creatinine: 1.5 mg/dL — AB (ref 0.5–1.1)
GLUCOSE: 118 mg/dL
Potassium: 4.6 mmol/L (ref 3.4–5.3)
Sodium: 144 mmol/L (ref 137–147)

## 2016-02-24 ENCOUNTER — Encounter: Payer: Self-pay | Admitting: Internal Medicine

## 2016-02-24 ENCOUNTER — Non-Acute Institutional Stay (SKILLED_NURSING_FACILITY): Payer: Medicare Other | Admitting: Internal Medicine

## 2016-02-24 DIAGNOSIS — D509 Iron deficiency anemia, unspecified: Secondary | ICD-10-CM | POA: Diagnosis not present

## 2016-02-24 DIAGNOSIS — N183 Chronic kidney disease, stage 3 unspecified: Secondary | ICD-10-CM

## 2016-02-24 DIAGNOSIS — I5032 Chronic diastolic (congestive) heart failure: Secondary | ICD-10-CM

## 2016-02-24 DIAGNOSIS — M542 Cervicalgia: Secondary | ICD-10-CM | POA: Diagnosis not present

## 2016-02-24 NOTE — Progress Notes (Signed)
Patient ID: Maria Christian, female   DOB: [birth year], 82 y.o.   MRN: [id number]   Location:  [facility] Room Number: [id number] Place of Service:  SNF (31) Provider:  Granville Lewis, PA-C  [name], DO  Patient Care Team: [name], DO as PCP - General (Internal Medicine) Gerlene Fee, NP as Nurse Practitioner (Nurse Practitioner)  Extended Emergency Contact Information Primary Emergency Contact: [place] of [place] Phone: [phone] Relation: Son Secondary Emergency Contact: Roberts,Lynn Address: [address]          [place], [place] [address] [name] of [place] Phone: [phone] Mobile Phone: [phone] Relation: Daughter  Code Status:  DNR Goals of care: Advanced Directive information Advanced Directives 02/22/2016  Does patient have an advance directive? Yes  Type of Advance Directive Out of facility DNR (pink MOST or yellow form)  Does patient want to make changes to advanced directive? No - Patient declined  Copy of advanced directive(s) in chart? -  Would patient like information on creating an advanced directive? -  Pre-existing out of facility DNR order (yellow form or pink MOST form) -     Chief Complaint  Patient presents with  . Acute Visit    Acute  Acute visit follow-up renal insufficiency-neck pain-anemia  HPI:  Pt is a 82 y.o. female seen today for an acute visit for the following issues.  She is been seen recently for renal insufficiency her creatinine did go up to 1.7 to at one point on September 16-we have been holding her Demadex and creatinine shows improvement on lab done yesterday September 20 at 1.5 to with a BUN of 25.6.  We did order renal ultrasound which was negative for any hydronephrosis  Does have a history of diastolic CHF but this appears to be stable I do not see any increased edema no complaints of shortness of breath or chest pain.  She is also complaining of  some neck pain this appears to be intermittent complaints Radiation into Her Head at Times Although This Appears to Be Intermittent-We Did Order an X-Ray of the Cervical Spine Which Was Non-concerning-I Did Discuss Findings with Dr. Eulas Post As Well As with Her Family-She Would Be a Poor Candidate for Any Aggressive Workup and They Have Expressed Understanding at This Point Will Monitor  She also has a history of anemia thought I suspect to be somewhat chronic hemoglobin of 9.7 on lab done September 16 appears to be relatively stable per chart review previous hemoglobins--back in July hemoglobin was 10.8-9.8-.Marland Kitchen  She does continue on iron       Past Medical History:  Diagnosis Date  . Altered mental status   . Anemia   . Atrial fibrillation ([place])   . Constipation 02/27/2015  . Coronary artery disease   . DEMENTIA   . Depression   . Diabetes mellitus   . Edema of lower extremity 07/13/11   right leg more swollen than left leg  . Encephalopathy   . Enlarged heart   . Esophageal dysmotility 07/02/12  . GERD (gastroesophageal reflux disease)   . History of adenomatous polyp of colon 06/24/99  . Horseshoe kidney   . Hyperlipemia   . Hypertension   . Pancreatic lesion 05/22/11   no further workup  per PCP/family due to age  . Parkinson disease ([place])   . Peripheral neuropathy ([place])   . Sigmoid volvulus ([place]) 05/24/2013  . Thrombophlebitis   . TIA (transient ischemic attack) 12/19/2012  Past Surgical History:  Procedure Laterality Date  . ABDOMINAL HYSTERECTOMY    . BREAST SURGERY     2 benign tumors removed left breast  . CHOLECYSTECTOMY    . ESOPHAGEAL DILATION     several times by Dr. [name]  . ESOPHAGOGASTRODUODENOSCOPY (EGD) WITH ESOPHAGEAL DILATION N/A 08/01/2012   Procedure: ESOPHAGOGASTRODUODENOSCOPY (EGD) WITH ESOPHAGEAL DILATION;  Surgeon: [name], MD;  Location: [facility];  Service: Endoscopy;  Laterality: N/A;  with c-arm savory dilators  .  ESOPHAGOGASTRODUODENOSCOPY (EGD) WITH PROPOFOL N/A 03/12/2015   Procedure: ESOPHAGOGASTRODUODENOSCOPY (EGD) WITH PROPOFOL;  Surgeon: [name], MD;  Location: [facility];  Service: Endoscopy;  Laterality: N/A;  . FLEXIBLE SIGMOIDOSCOPY N/A 05/24/2013   Procedure: FLEXIBLE SIGMOIDOSCOPY;  Surgeon: [name], MD;  Location: [facility];  Service: Endoscopy;  Laterality: N/A;  . FLEXIBLE SIGMOIDOSCOPY N/A 05/26/2013   Procedure:  flex with decompression of sigmoid volvulus;  Surgeon: [name], MD;  Location: [facility];  Service: Endoscopy;  Laterality: N/A;  . FLEXIBLE SIGMOIDOSCOPY N/A 12/20/2015   Procedure: FLEXIBLE SIGMOIDOSCOPY;  Surgeon: Milus Banister, MD;  Location: [facility];  Service: Endoscopy;  Laterality: N/A;  . FOOT SURGERY     benign tumors from foot  . NOSE SURGERY    . SHOULDER SURGERY  2001   left clavicle excision and acromioplasty  . TOTAL HIP ARTHROPLASTY      Allergies  Allergen Reactions  . Aspirin Other (See Comments)    G.I. Upset only      Medication List       Accurate as of 02/24/16  3:59 PM. Always use your most recent med list.          acetaminophen 325 MG tablet Commonly known as:  TYLENOL Take 2 tablets (650 mg total) by mouth every 6 (six) hours as needed for mild pain.   amLODipine 2.5 MG tablet Commonly known as:  NORVASC Take 2.5 mg by mouth every morning. Hold for BP < 100/60 or HR < 60   aspirin EC 81 MG tablet Take 81 mg by mouth daily.   baclofen 10 MG tablet Commonly known as:  LIORESAL Take 5 mg by mouth at bedtime.   Biotin 5 MG/ML Liqd Take 5 mLs by mouth 2 (two) times daily.   CALAZIME SKIN PROTECTANT EX Apply 1 application topically as directed. Cleanse buttocks and apply paste daily and as needed every day and night shift for protection.   carvedilol 3.125 MG tablet Commonly known as:  COREG Take 3.125 mg by mouth daily.   CERTAGEN PO Take 1 tablet by mouth daily.   cholecalciferol 1000  units tablet Commonly known as:  VITAMIN D Take 1,000 Units by mouth every morning.   clotrimazole 1 % cream Commonly known as:  LOTRIMIN Apply 1 application topically 2 (two) times daily. Apply to buttocks , labia topically every shift for rash then apply to buttocks, labia in the evening for rash.   Cranberry 475 MG Caps Take 1 capsule by mouth 2 (two) times daily.   docusate sodium 100 MG capsule Commonly known as:  COLACE Take 200 mg by mouth at bedtime.   dorzolamide 2 % ophthalmic solution Commonly known as:  TRUSOPT Place 1 drop into both eyes 3 (three) times daily. Reported on 11/16/2015        0800,1400,2000   escitalopram 10 MG tablet Commonly known as:  LEXAPRO Take 1 tablet (10 mg total) by mouth daily.   ferrous sulfate 325 (  65 FE) MG tablet Take 325 mg by mouth daily with breakfast.   fluticasone 50 MCG/ACT nasal spray Commonly known as:  FLONASE Place 2 sprays into both nostrils at bedtime.   gabapentin 100 MG capsule Commonly known as:  NEURONTIN Take 100 mg by mouth at bedtime.   lidocaine 5 % Commonly known as:  LIDODERM Place 1 patch onto the skin daily. Apply 1 patch to left lower back/hip every morning.  Remove & Discard patch within 12 hours or as directed by MD   LINZESS 145 MCG Caps capsule Generic drug:  linaclotide Take 145 mcg by mouth daily before breakfast.   loratadine 10 MG tablet Commonly known as:  CLARITIN Take 10 mg by mouth daily before breakfast.   LORazepam 1 MG tablet Commonly known as:  ATIVAN Take one tablet by mouth one dose   nystatin powder Generic drug:  nystatin Apply 100,000 g topically daily.   ondansetron 8 MG tablet Commonly known as:  ZOFRAN Take 8 mg by mouth every 8 (eight) hours as needed for nausea or vomiting.   OXYGEN Inhale 2 L into the lungs continuous.   polyethylene glycol packet Commonly known as:  MIRALAX / GLYCOLAX Take 17 g by mouth daily at 6 (six) AM.   potassium chloride SA 20 MEQ  tablet Commonly known as:  K-DUR,KLOR-CON Take 40 mEq by mouth 2 (two) times daily.   PROTONIX 40 mg/20 mL Pack Generic drug:  pantoprazole sodium Take 40 mg by mouth daily at 6 (six) AM.   saccharomyces boulardii 250 MG capsule Commonly known as:  FLORASTOR Take 250 mg by mouth 2 (two) times daily.   senna 8.6 MG Tabs tablet Commonly known as:  SENOKOT Take 1 tablet (8.6 mg total) by mouth 2 (two) times daily.   sodium chloride 0.65 % Soln nasal spray Commonly known as:  OCEAN Place 1 spray into both nostrils as directed. Four times daily And every 24 hours as needed to moisturize nasal passages.   Travoprost (BAK Free) 0.004 % Soln ophthalmic solution Commonly known as:  TRAVATAN Place 1 drop into both eyes daily at 8 pm.   TWOCAL HN Liqd Take 90 mLs by mouth 3 (three) times daily.   vitamin B-12 500 MCG tablet Commonly known as:  CYANOCOBALAMIN Take 500 mcg by mouth every morning.       Review of Systems Constitutional: Negative for appetite change and fatigue.  HENT: Negative for congestion.   Respiratory: Negative for cough, chest tightness and shortness of breath.   Cardiovascular: Negative for chest pain, palpitations or increased  leg swelling.  Gastrointestinal: Negative for nausea, abdominal pain, diarrhea and constipation.  Musculoskeletal: no complaint of muscle or joint pain does complain at times of neck discomfort    Skin: Negative for pallor.  Neurological: Negative for dizziness.  Psychiatric/Behavioral: The patient is not nervous/anxious.    Immunization History  Administered Date(s) Administered  . Influenza-Unspecified 07/13/2015  . PPD Test 07/28/2013, 02/18/2016   Pertinent  Health Maintenance Due  Topic Date Due  . URINE MICROALBUMIN  10/08/1936  . DEXA SCAN  10/09/1991  . HEMOGLOBIN A1C  01/15/2015  . INFLUENZA VACCINE  01/04/2016  . PNA vac Low Risk Adult (1 of 2 - PCV13) 11/15/2016 (Originally 10/09/1991)  . OPHTHALMOLOGY EXAM   09/20/2016  . FOOT EXAM  10/04/2016   No flowsheet data found. Functional Status Survey:    Vitals:   02/24/16 1555  BP: 130/70  Pulse: 71  Resp: 20  Temp:  97.1 F (36.2 C)  TempSrc: Oral  SpO2: 97%  Weight: 151 lb (68.5 kg)  Height: 5\' 7"  (1.702 m)   Body mass index is 23.65 kg/m. Physical Exam Constitutional: No distress.  Eyes: Conjunctivae are normal.  Neck: Neck supple. No JVD present. No thyromegaly present.  Cardiovascular: Normal rate, regular rhythm and intact distal pulses.   Respiratory: Effort normal and breath sounds normal. No respiratory distress. She has no wheezes.  Is 02 dependent  GI: Soft. Bowel sounds are normal. Slight distension present no tenderness Musculoskeletal: Has baseline right lower extremity edema  Does not have significant neck pain with palpation today  Able to move all extremities .   Lymphadenopathy:    She has no cervical adenopathy.  Neurological: She is alert.  Skin: Skin is warm and dry. She is not diaphoretic.  Psychiatric: She has a normal mood and affect Labs reviewed:  02/23/2016.  Sodium 144 potassium 4.6 BUN 25.6 creatinine 1.5 to  Recent Labs  02/27/15 0547  03/13/15 1814  12/20/15 0130 12/21/15 0519  12/22/15 0543 01/04/16 02/15/16  NA 140  < >  --   < > 138 143  < > 142 144 143  K 3.1*  < >  --   < > 3.2* 3.2*  --  3.9 3.8 4.5  CL 107  < >  --   < > 111 117*  --  117*  --   --   CO2 25  < >  --   < > 21* 22  --  20*  --   --   GLUCOSE 136*  < >  --   < > 138* 108*  --  125*  --   --   BUN 20  < >  --   < > 31* 23*  < > 18 13 19   CREATININE 1.32*  < >  --   < > 1.61* 1.15*  < > 1.09* 1.2* 1.5*  CALCIUM 8.9  < >  --   < > 8.9 8.4*  --  8.7*  --   --   MG 2.0  --  1.9  --   --  2.4  --   --   --   --   PHOS 3.6  --   --   --   --   --   --   --   --   --   < > = values in this interval not displayed.  Recent Labs  03/11/15 1100 03/12/15 0525  12/20/15 0130 01/04/16 02/15/16  AST 16 12*  < > 22 15 20     ALT 7* 6*  < > 13* 9 11  ALKPHOS 60 49  < > 95 91 107  BILITOT 0.3 0.6  --  0.8  --   --   PROT 6.5 5.6*  --  7.1  --   --   ALBUMIN 3.3* 2.8*  --  3.9  --   --   < > = values in this interval not displayed.  Recent Labs  02/26/15 1257  03/11/15 1100  04/19/15 1405  12/20/15 0130  12/21/15 0519  12/22/15 0543 01/04/16 02/15/16  WBC 5.5  < > 5.6  < > 4.3  < > 5.2  < > 3.9*  < > 5.1 4.1 4.6  NEUTROABS 3.5  --  3.7  --  2.5  --   --   --   --   --   --   --   --  HGB 10.7*  < > 7.9*  < > 9.2*  < > 11.0*  < > 9.8*  --  10.8* 10.9* 11.3*  HCT 32.1*  < > 23.4*  < > 28.2*  < > 32.4*  < > 29.7*  --  32.3* 32* 36  MCV 102.9*  < > 103.1*  < > 101.5*  --  101.3*  --  103.1*  --  103.2*  --   --   PLT 182  < > 203  < > 127.0*  < > 107*  < > 98*  --  101* 175 128*  < > = values in this interval not displayed. Lab Results  Component Value Date   TSH 5.235 (H) 02/27/2015   Lab Results  Component Value Date   HGBA1C 6.1 (A) 07/17/2014   Lab Results  Component Value Date   CHOL 137 06/12/2014   HDL 53 06/12/2014   LDLCALC 68 06/12/2014   TRIG 83 06/12/2014   CHOLHDL 2.3 05/23/2011    Significant Diagnostic Results in last 30 days:  No results found.  Assessment/Plan  #1 renal insufficiency creatinine appears to be improving somewhat she is off Demadex this will have to be watched although clinically she appears stable in this regards with no increased edema shortness of breath or chest congestion.  Will update a metabolic panel first lab day at this point continue to hold the Demadex.  Renal ultrasound did not show any hydronephrosis.  I do note she had been on aggressive potassium supplementation with history of hyperkalemia Will reduce her potassium to 20 mEq a day again keep a close eye on this with a lab first laboratory day next week.  In regards to CHF this appears stable monitor weights Monday Wednesday Friday notify provider of gain greater then 3 pounds.  #2 neck  pain-as noted above patient would be a poor candidate for any aggressive diagnosis CT scan or MRI-at this point continue to monitor clinically cervical x-ray was reassuring-she is not currently experience discomfort.   #3 anemia-this appears to be stable-with a hemoglobin of 9.7 on most recent lab-continue to monitor she is on iron   [id number]-of note greater than 25 minutes spent assessing patient-reviewing her chart-reviewing her diagnostic studies and labs-and discussing her status with 2 of her daughters

## 2016-02-26 NOTE — Progress Notes (Signed)
This is an acute visit.  Level care skilled.  [place]  Chief complaint-acute visit secondary to follow-up renal insufficiency.  History of present illness.  Patient is a pleasant 82 year old female being seen today for some renal insufficiency.  She does have a history of chronic diastolic CHF and is on Demadex 20 mg a day as well as aggressive potassium supplementation with her history of hypokalemia.  She also has a history of recurrent UTIs and recently completed a course of Invanz for ESBL she appears to be stable in this regards.--A repeat urine culture was done which actually did show yeast greater than 100,000 colonies  Her creatinine on lab done on September 12 is 1.45 which appears to be up somewhat from her baseline most recently it was 1.2 back in early August 2017.  She appears to have her baseline lower extremity edema she does have greater edema on the right versus the left.  She is not complaining of any shortness of breath cough or overt dysuria this evening.  Past Medical History:  Diagnosis Date  . Altered mental status   . Anemia   . Atrial fibrillation ([place])   . Constipation 02/27/2015  . Coronary artery disease   . DEMENTIA   . Depression   . Diabetes mellitus   . Edema of lower extremity 07/13/11   right leg more swollen than left leg  . Encephalopathy   . Enlarged heart   . Esophageal dysmotility 07/02/12  . GERD (gastroesophageal reflux disease)   . History of adenomatous polyp of colon 06/24/99  . Horseshoe kidney   . Hyperlipemia   . Hypertension   . Pancreatic lesion 05/22/11   no further workup  per PCP/family due to age  . Parkinson disease ([place])   . Peripheral neuropathy ([place])   . Sigmoid volvulus ([place]) 05/24/2013  . Thrombophlebitis   . TIA (transient ischemic attack) 12/19/2012         Past Surgical History:  Procedure Laterality Date  . ABDOMINAL HYSTERECTOMY    . BREAST SURGERY       2 benign tumors removed left breast  . CHOLECYSTECTOMY    . ESOPHAGEAL DILATION     several times by Dr. [name]  . ESOPHAGOGASTRODUODENOSCOPY (EGD) WITH ESOPHAGEAL DILATION N/A 08/01/2012   Procedure: ESOPHAGOGASTRODUODENOSCOPY (EGD) WITH ESOPHAGEAL DILATION;  Surgeon: [name], MD;  Location: [facility];  Service: Endoscopy;  Laterality: N/A;  with c-arm savory dilators  . ESOPHAGOGASTRODUODENOSCOPY (EGD) WITH PROPOFOL N/A 03/12/2015   Procedure: ESOPHAGOGASTRODUODENOSCOPY (EGD) WITH PROPOFOL;  Surgeon: [name], MD;  Location: [facility];  Service: Endoscopy;  Laterality: N/A;  . FLEXIBLE SIGMOIDOSCOPY N/A 05/24/2013   Procedure: FLEXIBLE SIGMOIDOSCOPY;  Surgeon: [name], MD;  Location: [facility];  Service: Endoscopy;  Laterality: N/A;  . FLEXIBLE SIGMOIDOSCOPY N/A 05/26/2013   Procedure:  flex with decompression of sigmoid volvulus;  Surgeon: [name], MD;  Location: [facility];  Service: Endoscopy;  Laterality: N/A;  . FLEXIBLE SIGMOIDOSCOPY N/A 12/20/2015   Procedure: FLEXIBLE SIGMOIDOSCOPY;  Surgeon: Milus Banister, MD;  Location: [facility];  Service: Endoscopy;  Laterality: N/A;  . FOOT SURGERY     benign tumors from foot  . NOSE SURGERY    . SHOULDER SURGERY  2001   left clavicle excision and acromioplasty  . TOTAL HIP ARTHROPLASTY      Patient Care Team: [name], DO as PCP - General (Internal Medicine) Gerlene Fee, NP as  Nurse Practitioner (Nurse Practitioner)  Social History        Social History  . Marital status: Widowed    Spouse name: N/A  . Number of children: 5  . Years of education: 10th       Occupational History  . Retired         Social History Main Topics  . Smoking status: Former Smoker    Quit date: 10/09/1964  . Smokeless tobacco: Never Used  . Alcohol use No  . Drug use: No  . Sexual activity: No       Other Topics Concern  . Not on file      Social History  Narrative   Pt lives at [facility] and [place].   Caffeine Use: very small amount daily     reports that she quit smoking about 51 years ago. She has never used smokeless tobacco. She reports that she does not drink alcohol or use drugs.       Family History  Problem Relation Age of Onset  . Cancer    . Heart disease         Family Status  Relation Status  . Mother Deceased at age 89   blood clots  . Father Deceased at age 12   cirrohis of the liver  .          Immunization History  Administered Date(s) Administered  . Influenza-Unspecified 07/13/2015  . PPD Test 07/28/2013         Allergies  Allergen Reactions  . Aspirin Other (See Comments)    G.I. Upset only    Medications:     Patient's Medications  New Prescriptions   No medications on file  Previous Medications   ACETAMINOPHEN (TYLENOL) 325 MG TABLET    Take 2 tablets (650 mg total) by mouth every 6 (six) hours as needed for mild pain.   AMLODIPINE (NORVASC) 2.5 MG TABLET    Take 2.5 mg by mouth every morning. Hold for BP < 100/60 or HR < 60   ASPIRIN EC 81 MG TABLET    Take 81 mg by mouth daily.   BACLOFEN (LIORESAL) 10 MG TABLET    Take 5 mg by mouth at bedtime.   BIOTIN 5 MG/ML LIQD    Take 5 mLs by mouth 2 (two) times daily.    CARVEDILOL (COREG) 3.125 MG TABLET    Take 3.125 mg by mouth daily.   CHOLECALCIFEROL (VITAMIN D) 1000 UNITS TABLET    Take 1,000 Units by mouth every morning.    CLOTRIMAZOLE (LOTRIMIN) 1 % CREAM    Apply 1 application topically 2 (two) times daily. Apply to buttocks , labia topically every shift for rash then apply to buttocks, labia in the evening for rash.   CRANBERRY 475 MG CAPS    Take 1 capsule by mouth 2 (two) times daily.    DOCUSATE SODIUM (COLACE) 100 MG CAPSULE    Take 200 mg by mouth at bedtime.    DORZOLAMIDE (TRUSOPT) 2 % OPHTHALMIC SOLUTION    Place 1 drop into both eyes 3 (three) times daily. Reported on 11/16/2015         0800,1400,2000   ESCITALOPRAM (LEXAPRO) 10 MG TABLET    Take 1 tablet (10 mg total) by mouth daily.   FERROUS SULFATE 325 (65 FE) MG TABLET    Take 325 mg by mouth daily with breakfast.   FLUTICASONE (FLONASE) 50 MCG/ACT NASAL SPRAY    Place 2 sprays  into both nostrils at bedtime.   FUROSEMIDE (LASIX) 40 MG TABLET    Take 1 tablet (40 mg total) by mouth daily.   GABAPENTIN (NEURONTIN) 100 MG CAPSULE    Take 100 mg by mouth at bedtime.   LIDOCAINE (LIDODERM) 5 %    Place 1 patch onto the skin daily. Apply 1 patch to left lower back/hip every morning.  Remove & Discard patch within 12 hours or as directed by MD   LINACLOTIDE (LINZESS) 145 MCG CAPS CAPSULE    Take 145 mcg by mouth daily before breakfast.    LORATADINE (CLARITIN) 10 MG TABLET    Take 10 mg by mouth daily before breakfast.    MULTIPLE VITAMINS-MINERALS (CERTAGEN PO)    Take 1 tablet by mouth daily.    NUTRITIONAL SUPPLEMENTS (TWOCAL HN) LIQD    Take 90 mLs by mouth 3 (three) times daily.   ONDANSETRON (ZOFRAN) 8 MG TABLET    Take 8 mg by mouth every 8 (eight) hours as needed for nausea or vomiting.   OXYGEN    Inhale 2 L into the lungs continuous.   POLYETHYLENE GLYCOL (MIRALAX / GLYCOLAX) PACKET    Take 17 g by mouth daily at 6 (six) AM.    POTASSIUM CHLORIDE SA (K-DUR,KLOR-CON) 20 MEQ TABLET    Take 40 mEq by mouth 2 (two) times daily.    PROTONIX 40 MG PACK    Take 40 mg by mouth daily at 6 (six) AM.   SENNA (SENOKOT) 8.6 MG TABS TABLET    Take 1 tablet (8.6 mg total) by mouth 2 (two) times daily.   SKIN PROTECTANTS, MISC. (CALAZIME SKIN PROTECTANT EX)    Apply 1 application topically as directed. Cleanse buttocks and apply paste daily and as needed every day and night shift for protection.   SODIUM CHLORIDE (OCEAN) 0.65 % SOLN NASAL SPRAY    Place 1 spray into both nostrils as directed. Four times daily And every 24 hours as needed to moisturize nasal passages.   TRAVOPROST, BAK FREE, (TRAVATAN) 0.004 % SOLN  OPHTHALMIC SOLUTION    Place 1 drop into both eyes daily at 8 pm.    VITAMIN B-12 (CYANOCOBALAMIN) 500 MCG TABLET    Take 500 mcg by mouth every morning.   Modified Medications   No medications on file  Discontinued Medications   ALUM & MAG HYDROXIDE-SIMETH (MAALOX/MYLANTA) 200-200-20 MG/5ML SUSPENSION    Take 15 mLs by mouth once.    Review of Systems  This is somewhat limited secondary to patient being a poor historian.  However she is not complaining of any fever or chills or dysuria.  Does not complain of shortness of breath or chest pain has lower extremity edema which apparently is fairly baseline although increased on the right versus the left    Body mass index is 23.49 kg/m. She is afebrile pulse 61 respirations 16 blood pressure 136/62 Physical Exam  Constitutional: She appears well-developed and well-nourished.  Frail appearing in NAD. [place] O2 intact  HENT:  Mouth/Throat: Oropharynx is clear and moist. No oropharyngeal exudate.  Eyes: Pupils are equal, round, and reactive to light. No scleral icterus.  Neck: Neck supple. . No tracheal deviation present. No thyromegaly present.  Cardiovascular: Normal rate, regular rhythm with occasional irregular beat Murmur (1/6 SEM) heard.  trace left lower extremity edema 1+ right lower extremity edema  pedalpulse is intact bilaterally Pulmonary/Chest: Effort normal and breath sounds normal. No stridor. No respiratory distress. She has no wheezes. She  has no rales.  Abdominal: Soft. Bowel sounds are normal. She exhibits no distension and no mass. There is no hepatomegaly. There is no tenderness. There is no rebound and no guarding.    GU could not really appreciate any rash-somewhat irritated appearing vaginal area  Musculoskeletal: She exhibits edema. As noted above  Lymphadenopathy:    She has no cervical adenopathy.  Neurological: She is alert.  Skin: Skin is warm and dry. No rash noted.  Psychiatric: She has a normal  mood and affect. Her behavior is normal.     Labs reviewed:  02/15/2016.  Sodium 143 potassium 4.5 BUN 19.3 creatinine 1.4  WBC 4.6 hemoglobin 11.3 platelets 128        Erroneous Encounter on 01/05/2016  Component Date Value Ref Range Status  . Hemoglobin 01/04/2016 10.9* 12.0 - 16.0 g/dL Final  . HCT 01/04/2016 32* 36 - 46 % Final  . Platelets 01/04/2016 175  150 - 399 K/L Final  . WBC 01/04/2016 4.1  10^3/mL Final  . Glucose 01/04/2016 121  mg/dL Final  . BUN 01/04/2016 13  4 - 21 mg/dL Final  . Creatinine 01/04/2016 1.2* 0.5 - 1.1 mg/dL Final  . Potassium 01/04/2016 3.8  3.4 - 5.3 mmol/L Final  . Sodium 01/04/2016 144  137 - 147 mmol/L Final  . Alkaline Phosphatase 01/04/2016 91  25 - 125 U/L Final  . ALT 01/04/2016 9  7 - 35 U/L Final  . AST 01/04/2016 15  13 - 35 U/L Final  . Bilirubin, Total 01/04/2016 0.3  mg/dL Final  Nursing Home on 12/27/2015  Component Date Value Ref Range Status  . Hemoglobin 12/21/2015 9.8* 12.0 - 16.0 g/dL Final  . HCT 12/21/2015 30* 36 - 46 % Final  . Platelets 12/21/2015 98* 150 - 399 K/L Final  . WBC 12/21/2015 3.9  10^3/mL Final  . WBC 12/22/2015 5.1  10^3/mL Final  . Glucose 12/22/2015 125  mg/dL Final  . BUN 12/22/2015 18  4 - 21 mg/dL Final  . Creatinine 12/22/2015 1.1  0.5 - 1.1 mg/dL Final  . Sodium 12/22/2015 142  137 - 147 mmol/L Final  . Bilirubin, Total 12/22/2015 0.8  mg/dL Final  Admission on 12/20/2015, Discharged on 12/23/2015  Component Date Value Ref Range Status  . Lipase 12/20/2015 30  11 - 51 U/L Final  . Sodium 12/20/2015 138  135 - 145 mmol/L Final  . Potassium 12/20/2015 3.2* 3.5 - 5.1 mmol/L Final  . Chloride 12/20/2015 111  101 - 111 mmol/L Final  . CO2 12/20/2015 21* 22 - 32 mmol/L Final  . Glucose, Bld 12/20/2015 138* 65 - 99 mg/dL Final  . BUN 12/20/2015 31* 6 - 20 mg/dL Final  . Creatinine, Ser 12/20/2015 1.61* 0.44 - 1.00 mg/dL Final  . Calcium 12/20/2015 8.9  8.9 - 10.3 mg/dL Final  . Total Protein  12/20/2015 7.1  6.5 - 8.1 g/dL Final  . Albumin 12/20/2015 3.9  3.5 - 5.0 g/dL Final  . AST 12/20/2015 22  15 - 41 U/L Final  . ALT 12/20/2015 13* 14 - 54 U/L Final  . Alkaline Phosphatase 12/20/2015 95  38 - 126 U/L Final  . Total Bilirubin 12/20/2015 0.8  0.3 - 1.2 mg/dL Final  . GFR calc non Af Amer 12/20/2015 27* >60 mL/min Final  . GFR calc Af Amer 12/20/2015 32* >60 mL/min Final   Comment: (NOTE) The eGFR has been calculated using the CKD EPI equation. This calculation has not been validated in all  clinical situations. eGFR's persistently <60 mL/min signify possible Chronic Kidney Disease.  . Anion gap 12/20/2015 6  5 - 15 Final  . WBC 12/20/2015 5.2  4.0 - 10.5 K/uL Final  . RBC 12/20/2015 3.20* 3.87 - 5.11 MIL/uL Final  . Hemoglobin 12/20/2015 11.0* 12.0 - 15.0 g/dL Final  . HCT 12/20/2015 32.4* 36.0 - 46.0 % Final  . MCV 12/20/2015 101.3* 78.0 - 100.0 fL Final  . MCH 12/20/2015 34.4* 26.0 - 34.0 pg Final  . MCHC 12/20/2015 34.0  30.0 - 36.0 g/dL Final  . RDW 12/20/2015 13.8  11.5 - 15.5 % Final  . Platelets 12/20/2015 107* 150 - 400 K/uL Final   Comment: SPECIMEN CHECKED FOR CLOTS REPEATED TO VERIFY PLATELET COUNT CONFIRMED BY SMEAR  . Color, Urine 12/20/2015 YELLOW  YELLOW Final  . APPearance 12/20/2015 TURBID* CLEAR Final  . Specific Gravity, Urine 12/20/2015 1.013  1.005 - 1.030 Final  . pH 12/20/2015 5.5  5.0 - 8.0 Final  . Glucose, UA 12/20/2015 NEGATIVE  NEGATIVE mg/dL Final  . Hgb urine dipstick 12/20/2015 MODERATE* NEGATIVE Final  . Bilirubin Urine 12/20/2015 NEGATIVE  NEGATIVE Final  . Ketones, ur 12/20/2015 NEGATIVE  NEGATIVE mg/dL Final  . Protein, ur 12/20/2015 30* NEGATIVE mg/dL Final  . Nitrite 12/20/2015 POSITIVE* NEGATIVE Final  . Leukocytes, UA 12/20/2015 LARGE* NEGATIVE Final  . Lactic Acid, Venous 12/20/2015 0.85  0.5 - 1.9 mmol/L Final  . Lactic Acid, Venous 12/20/2015 0.65  0.5 - 1.9 mmol/L Final  . Squamous Epithelial / LPF 12/20/2015 0-5* NONE  SEEN Final  . WBC, UA 12/20/2015 TOO NUMEROUS TO COUNT  0 - 5 WBC/hpf Final  . RBC / HPF 12/20/2015 6-30  0 - 5 RBC/hpf Final  . Bacteria, UA 12/20/2015 MANY* NONE SEEN Final  . Specimen Description 12/22/2015 URINE, CLEAN CATCH   Final  . Special Requests 12/22/2015 NONE   Final  . Culture 12/22/2015 >=100,000 COLONIES/mL ESCHERICHIA COLI*  Final  . Report Status 12/22/2015 12/22/2015 FINAL   Final  . Organism ID, Bacteria 12/22/2015 ESCHERICHIA COLI*  Final  . WBC 12/21/2015 3.9* 4.0 - 10.5 K/uL Final  . RBC 12/21/2015 2.88* 3.87 - 5.11 MIL/uL Final  . Hemoglobin 12/21/2015 9.8* 12.0 - 15.0 g/dL Final  . HCT 12/21/2015 29.7* 36.0 - 46.0 % Final  . MCV 12/21/2015 103.1* 78.0 - 100.0 fL Final  . MCH 12/21/2015 34.0  26.0 - 34.0 pg Final  . MCHC 12/21/2015 33.0  30.0 - 36.0 g/dL Final  . RDW 12/21/2015 14.3  11.5 - 15.5 % Final  . Platelets 12/21/2015 98* 150 - 400 K/uL Final  . Sodium 12/21/2015 143  135 - 145 mmol/L Final  . Potassium 12/21/2015 3.2* 3.5 - 5.1 mmol/L Final  . Chloride 12/21/2015 117* 101 - 111 mmol/L Final  . CO2 12/21/2015 22  22 - 32 mmol/L Final  . Glucose, Bld 12/21/2015 108* 65 - 99 mg/dL Final  . BUN 12/21/2015 23* 6 - 20 mg/dL Final  . Creatinine, Ser 12/21/2015 1.15* 0.44 - 1.00 mg/dL Final  . Calcium 12/21/2015 8.4* 8.9 - 10.3 mg/dL Final  . GFR calc non Af Amer 12/21/2015 41* >60 mL/min Final  . GFR calc Af Amer 12/21/2015 47* >60 mL/min Final   Comment: (NOTE) The eGFR has been calculated using the CKD EPI equation. This calculation has not been validated in all clinical situations. eGFR's persistently <60 mL/min signify possible Chronic Kidney Disease.  . Anion gap 12/21/2015 4* 5 - 15 Final  .  Magnesium 12/21/2015 2.4  1.7 - 2.4 mg/dL Final  . MRSA by PCR 12/20/2015 NEGATIVE  NEGATIVE Final   Comment:        The GeneXpert MRSA Assay (FDA approved for NASAL specimens only), is one component of a comprehensive MRSA colonization surveillance  program. It is not intended to diagnose MRSA infection nor to guide or monitor treatment for MRSA infections.  . Sodium 12/22/2015 142  135 - 145 mmol/L Final  . Potassium 12/22/2015 3.9  3.5 - 5.1 mmol/L Final   Comment: DELTA CHECK NOTED REPEATED TO VERIFY NO VISIBLE HEMOLYSIS  . Chloride 12/22/2015 117* 101 - 111 mmol/L Final  . CO2 12/22/2015 20* 22 - 32 mmol/L Final  . Glucose, Bld 12/22/2015 125* 65 - 99 mg/dL Final  . BUN 12/22/2015 18  6 - 20 mg/dL Final  . Creatinine, Ser 12/22/2015 1.09* 0.44 - 1.00 mg/dL Final  . Calcium 12/22/2015 8.7* 8.9 - 10.3 mg/dL Final  . GFR calc non Af Amer 12/22/2015 44* >60 mL/min Final  . GFR calc Af Amer 12/22/2015 51* >60 mL/min Final   Comment: (NOTE) The eGFR has been calculated using the CKD EPI equation. This calculation has not been validated in all clinical situations. eGFR's persistently <60 mL/min signify possible Chronic Kidney Disease.  . Anion gap 12/22/2015 5  5 - 15 Final  . WBC 12/22/2015 5.1  4.0 - 10.5 K/uL Final  . RBC 12/22/2015 3.13* 3.87 - 5.11 MIL/uL Final  . Hemoglobin 12/22/2015 10.8* 12.0 - 15.0 g/dL Final  . HCT 12/22/2015 32.3* 36.0 - 46.0 % Final  . MCV 12/22/2015 103.2* 78.0 - 100.0 fL Final  . MCH 12/22/2015 34.5* 26.0 - 34.0 pg Final  . MCHC 12/22/2015 33.4  30.0 - 36.0 g/dL Final  . RDW 12/22/2015 14.5  11.5 - 15.5 % Final  . Platelets 12/22/2015 101* 150 - 400 K/uL Final  Nursing Home on 10/28/2015  Component Date Value Ref Range Status  . Hemoglobin 10/14/2015 11.5* 12.0 - 16.0 g/dL Final  . HCT 10/14/2015 34* 36 - 46 % Final  . Platelets 10/14/2015 127* 150 - 399 K/L Final  . WBC 10/14/2015 5.0  10^3/mL Final  . Glucose 10/14/2015 132  mg/dL Final  . BUN 10/14/2015 16  4 - 21 mg/dL Final  . Creatinine 10/14/2015 1.3* 0.5 - 1.1 mg/dL Final  . Potassium 10/14/2015 3.8  3.4 - 5.3 mmol/L Final  . Sodium 10/14/2015 143  137 - 147 mmol/L Final  . Alkaline Phosphatase 10/14/2015 104  25 - 125 U/L Final   . ALT 10/14/2015 11  7 - 35 U/L Final  . AST 10/14/2015 16  13 - 35 U/L Final  . Bilirubin, Total 10/14/2015 0.4  mg/dL Final   Assessment and plan.  #1 renal insufficiency-creatinine has gone up a bit from baseline Will hold her Demadex for 2 days also reduce potassium down to 20 mEq daily for 2 days then resume previous Demadex dose of 20 mg day as well as potassium 40 mEq twice a day.  Obtain a BMP on Monday, September 18.  #2 urine culture which is grown out [place] will treat with Diflucan 3 days 100 mg daily 3 days.  #3 history of right leg edema apparently this is not  new but still appears fairly significant will order venous Doppler just to rule out any DVT  #4-[id number] of diastolic CHF this appears to be stable again will hold Demadex temporarily secondary to elevated creatinine continue to monitor for  any increase shortness of breath chest pain or increased edema from baseline  [id number]

## 2016-03-01 ENCOUNTER — Encounter: Payer: Self-pay | Admitting: Adult Health

## 2016-03-01 ENCOUNTER — Non-Acute Institutional Stay (SKILLED_NURSING_FACILITY): Payer: Medicare Other | Admitting: Adult Health

## 2016-03-01 DIAGNOSIS — K562 Volvulus: Secondary | ICD-10-CM

## 2016-03-01 DIAGNOSIS — I5032 Chronic diastolic (congestive) heart failure: Secondary | ICD-10-CM | POA: Diagnosis not present

## 2016-03-01 DIAGNOSIS — I482 Chronic atrial fibrillation, unspecified: Secondary | ICD-10-CM

## 2016-03-01 DIAGNOSIS — N183 Chronic kidney disease, stage 3 unspecified: Secondary | ICD-10-CM

## 2016-03-01 DIAGNOSIS — M150 Primary generalized (osteo)arthritis: Secondary | ICD-10-CM | POA: Diagnosis not present

## 2016-03-01 DIAGNOSIS — I110 Hypertensive heart disease with heart failure: Secondary | ICD-10-CM

## 2016-03-01 DIAGNOSIS — J9611 Chronic respiratory failure with hypoxia: Secondary | ICD-10-CM

## 2016-03-01 DIAGNOSIS — D509 Iron deficiency anemia, unspecified: Secondary | ICD-10-CM

## 2016-03-01 DIAGNOSIS — M159 Polyosteoarthritis, unspecified: Secondary | ICD-10-CM

## 2016-03-01 DIAGNOSIS — M545 Low back pain, unspecified: Secondary | ICD-10-CM

## 2016-03-01 DIAGNOSIS — K219 Gastro-esophageal reflux disease without esophagitis: Secondary | ICD-10-CM

## 2016-03-01 NOTE — Progress Notes (Signed)
Patient ID: Maria Christian, female   DOB: [birth year], 82 y.o.   MRN: [id number]   Location:    [facility] Room Number: 220-A Place of Service:  SNF (31)   CODE STATUS: DNR  Allergies  Allergen Reactions  . Aspirin Other (See Comments)    Maria Christian only    Chief Complaint  Patient presents with  . Medical Management of Chronic Issues    Follow up    HPI:  She is a long term resident of this facility being seen for the management of her chronic illnesses. Overall her status is stable. She tells me that she is feeling good and has no concerns. She does get out of bed on a daily basis and does attend activities.  There are no nursing concerns at this time.   Past Medical History:  Diagnosis Date  . Altered mental status   . Anemia   . Atrial fibrillation ([place])   . Constipation 02/27/2015  . Coronary artery disease   . DEMENTIA   . Depression   . Diabetes mellitus   . Edema of lower extremity 07/13/11   right leg more swollen than left leg  . Encephalopathy   . Enlarged heart   . Esophageal dysmotility 07/02/12  . GERD (gastroesophageal reflux disease)   . History of adenomatous polyp of colon 06/24/99  . Horseshoe kidney   . Hyperlipemia   . Hypertension   . Pancreatic lesion 05/22/11   no further workup  per PCP/family due to age  . Parkinson disease ([place])   . Peripheral neuropathy ([place])   . Sigmoid volvulus ([place]) 05/24/2013  . Thrombophlebitis   . TIA (transient ischemic attack) 12/19/2012    Past Surgical History:  Procedure Laterality Date  . ABDOMINAL HYSTERECTOMY    . BREAST SURGERY     2 benign tumors removed left breast  . CHOLECYSTECTOMY    . ESOPHAGEAL DILATION     several times by Dr. [name]  . ESOPHAGOGASTRODUODENOSCOPY (EGD) WITH ESOPHAGEAL DILATION N/A 08/01/2012   Procedure: ESOPHAGOGASTRODUODENOSCOPY (EGD) WITH ESOPHAGEAL DILATION;  Surgeon: [name], MD;  Location: [facility];  Service: Endoscopy;  Laterality: N/A;  with  c-arm savory dilators  . ESOPHAGOGASTRODUODENOSCOPY (EGD) WITH PROPOFOL N/A 03/12/2015   Procedure: ESOPHAGOGASTRODUODENOSCOPY (EGD) WITH PROPOFOL;  Surgeon: [name], MD;  Location: [facility];  Service: Endoscopy;  Laterality: N/A;  . FLEXIBLE SIGMOIDOSCOPY N/A 05/24/2013   Procedure: FLEXIBLE SIGMOIDOSCOPY;  Surgeon: [name], MD;  Location: [facility];  Service: Endoscopy;  Laterality: N/A;  . FLEXIBLE SIGMOIDOSCOPY N/A 05/26/2013   Procedure:  flex with decompression of sigmoid volvulus;  Surgeon: [name], MD;  Location: [facility];  Service: Endoscopy;  Laterality: N/A;  . FLEXIBLE SIGMOIDOSCOPY N/A 12/20/2015   Procedure: FLEXIBLE SIGMOIDOSCOPY;  Surgeon: Milus Banister, MD;  Location: [facility];  Service: Endoscopy;  Laterality: N/A;  . FOOT SURGERY     benign tumors from foot  . NOSE SURGERY    . SHOULDER SURGERY  2001   left clavicle excision and acromioplasty  . TOTAL HIP ARTHROPLASTY      Social History   Social History  . Marital status: Widowed    Spouse name: N/A  . Number of children: 5  . Years of education: 10th   Occupational History  . Retired    Social History Main Topics  . Smoking status: Former Smoker    Quit date: 10/09/1964  . Smokeless tobacco: Never Used  .  Alcohol use No  . Drug use: No  . Sexual activity: No   Other Topics Concern  . Not on file   Social History Narrative   Pt lives at [facility] and [place].   Caffeine Use: very small amount daily   Family History  Problem Relation Age of Onset  . Cancer    . Heart disease        VITAL SIGNS BP (!) 142/76   Pulse 67   Temp 97.1 F (36.2 C) (Oral)   Resp 20   Ht 5\' 7"  (1.702 m)   Wt 153 lb (69.4 kg)   SpO2 98%   BMI 23.96 kg/m   Patient's Medications  New Prescriptions   No medications on file  Previous Medications   ACETAMINOPHEN (TYLENOL) 325 MG TABLET    Take 2 tablets (650 mg total) by mouth every 6 (six) hours as needed for mild  pain.   AMLODIPINE (NORVASC) 2.5 MG TABLET    Take 2.5 mg by mouth every morning. Hold for BP < 100/60 or HR < 60   ASPIRIN EC 81 MG TABLET    Take 81 mg by mouth daily.   BACLOFEN (LIORESAL) 10 MG TABLET    Take 5 mg by mouth at bedtime.   BIOTIN 5 MG/ML LIQD    Take 5 mLs by mouth 2 (two) times daily.    CARVEDILOL (COREG) 3.125 MG TABLET    Take 3.125 mg by mouth daily.   CHOLECALCIFEROL (VITAMIN D) 1000 UNITS TABLET    Take 1,000 Units by mouth every morning.    CRANBERRY 475 MG CAPS    Take 1 capsule by mouth 2 (two) times daily.    DOCUSATE SODIUM (COLACE) 100 MG CAPSULE    Take 200 mg by mouth at bedtime.    DORZOLAMIDE (TRUSOPT) 2 % OPHTHALMIC SOLUTION    Place 1 drop into both eyes 3 (three) times daily. Reported on 11/16/2015        0800,1400,2000   ESCITALOPRAM (LEXAPRO) 10 MG TABLET    Take 1 tablet (10 mg total) by mouth daily.   FERROUS SULFATE 325 (65 FE) MG TABLET    Take 325 mg by mouth daily with breakfast.   FLUTICASONE (FLONASE) 50 MCG/ACT NASAL SPRAY    Place 2 sprays into both nostrils at bedtime.   GABAPENTIN (NEURONTIN) 100 MG CAPSULE    Take 200 mg by mouth at bedtime.    LIDOCAINE (LIDODERM) 5 %    Place 1 patch onto the skin daily. Apply 1 patch to left lower back/hip every morning.  Remove & Discard patch within 12 hours or as directed by MD   LINACLOTIDE (LINZESS) 145 MCG CAPS CAPSULE    Take 145 mcg by mouth daily before breakfast.    LORATADINE (CLARITIN) 10 MG TABLET    Take 10 mg by mouth daily before breakfast.    MULTIPLE VITAMINS-MINERALS (CERTAGEN PO)    Take 1 tablet by mouth daily.    ONDANSETRON (ZOFRAN) 8 MG TABLET    Take 8 mg by mouth every 8 (eight) hours as needed for nausea or vomiting.   OXYGEN    Inhale 2 L into the lungs continuous.   POLYETHYLENE GLYCOL (MIRALAX / GLYCOLAX) PACKET    Take 17 g by mouth daily at 6 (six) AM.    POTASSIUM CHLORIDE SA (K-DUR,KLOR-CON) 20 MEQ TABLET    Take 40 mEq by mouth 2 (two) times daily.    PROTONIX 40 MG PACK  Take 40 mg by mouth daily at 6 (six) AM.   SENNA (SENOKOT) 8.6 MG TABS TABLET    Take 1 tablet (8.6 mg total) by mouth 2 (two) times daily.   SKIN PROTECTANTS, MISC. (CALAZIME SKIN PROTECTANT EX)    Apply 1 application topically as directed. Cleanse buttocks and apply paste daily and as needed every day and night shift for protection.   SODIUM CHLORIDE (OCEAN) 0.65 % SOLN NASAL SPRAY    Place 1 spray into both nostrils as directed. Four times daily And every 24 hours as needed to moisturize nasal passages.   TRAVOPROST, BAK FREE, (TRAVATAN) 0.004 % SOLN OPHTHALMIC SOLUTION    Place 1 drop into both eyes daily at 8 pm.    VITAMIN B-12 (CYANOCOBALAMIN) 500 MCG TABLET    Take 500 mcg by mouth every morning.   Modified Medications   No medications on file  Discontinued Medications   CLOTRIMAZOLE (LOTRIMIN) 1 % CREAM    Apply 1 application topically 2 (two) times daily. Apply to buttocks , labia topically every shift for rash then apply to buttocks, labia in the evening for rash.   LORAZEPAM (ATIVAN) 1 MG TABLET    Take one tablet by mouth one dose   NUTRITIONAL SUPPLEMENTS (TWOCAL HN) LIQD    Take 90 mLs by mouth 3 (three) times daily.   NYSTATIN (NYSTATIN) POWDER    Apply 100,000 g topically daily.     SIGNIFICANT DIAGNOSTIC EXAMS  12-21-14: chest x-ray: no acute cardiopulmonary process   01-02-15: left hand x-ray: moderate osteoarthritis   02-15-15: abdominal ultrasound: 1. Gallbladder not visualized. 2. No hydronephrosis no renal calculus; 3. The pancreas not well visualized; which could be related to gassy abdomen but an appearance which might represent pancreatitis in the appropriate clinical setting.   02-26-15: right lower extremity doppler: - No evidence of deep vein thrombosis involving the right lower   extremity. - No evidence of Baker&'s cyst on the right.  03-12-15: upper endoscopy: duodenal bulb 1.5-2.0 cm ulcer with clot at base. No active bleeding. Surrounding mucosa was  edematous. Biopsy of the surrounding mucosa were done.   05-11-15: right ankle x-ray: normal   12-19-15: kub: moderate colonic ileus   12-20-15: kub: 1. Markedly distended loops of colon without definitive gas in the rectum. Given the similar pattern on previous studies and history, this likely represents chronic colonic ileus. It would be very difficult to exclude a volvulus on this study alone and if there is concern, a CT scan could better evaluate.  12-20-15: ct of abdomen and pelvis: 1. Loosely twisted sigmoid volvulus with transition point. Proximal distention is improved compared to radiography earlier today suggesting partial interval decompression. 2. Chronic findings are stable and described above.  12-20-15: flex sigmoidoscopy: chronic intermittent sigmoid volvulus. The site of volvulus was only slightly twisted during this exam, easily passed with colonoscope and the air filled dilated colon proximal to it was suctioned extensively. Her abdomen was flat, normal after the exam.   02-19-16: right lower extremity doppler: neg dvt  02-23-16: renal ultrasound: normal right kidney slightly small left kidney. Right 1.9 cm cyst no hydronephrosis or stones    LABS REVIEWED:     02-26-15: wbc 5.5 ;hgb 10.7; hct 32.1; mcv 102.9; plt 182; glucose 160; bun 23; creat 1.28; k+ 3.4; na++141; liver normal albumin 3.9; urine culture: 20,000 colonies e-coli 02-26-14: wbc 4.8; hgb 9.9; hct 30.2; mcv 103.4; plt 167; glucose 136; bun 20; creat 1.32; k+ 3.1; na++140; liver  normal albumin 3.4 phos 3.6; mag 2.0; tsh 5.235  03-09-15: wbc 5.8; hgb 10.2; hct 30.3; mcv 103.1; plt 221; glucose 137; bun 14; creat 1.07; k+ 3.2; na++139; liver normal albumin 3.7; lipase 24; urine culture: e-coli: cipro 03-12-15: wbc 4.5 hgb 7.6; hct 23.0; mcv 101.3; plt 168; glucose 138; bun 40; creat 1.24; k+ 3.0; na++141; liver normal albumin 2.8 03-15-15: wbc 5.4; hgb 8.1; hct 25.1; mcv 105.5; plt 171; glucose 144; bun 13; creat 0.96; k+  3.2; na++141  03-18-15: wbc 5.3; hgb 7.7; hct 24.9; mcv 105; plt 188; glucose 149; bun 8.1; creat 1.12; k+ 3.6; na++143  04-30-15: H-pylori: neg  10-14-15: wbc 5.0; hgb 11.5; hct 33.9; mcv 101.2; plt 127; glucose 132; bun 15.8; creat 1.27; k+ 3.8; na++ 143; liver normal albumin 4.0  12-20-15: wbc 5.2; hgb 11.0; hct 32.4; mcv 101.3; plt 107; glucose 138; bun 31; creat 1.61; k+ 3.2; na++ 138; liver normal albumin 3.9; urine culture: e-coli: rocephin 12-21-15: wbc 3.9; hgb 9.8; hct 29.7; mcv 103.1; plt 98; glucose 108; bun 23; creat 1.15; k+ 3.2; na++ 143; mag 2.4 12-22-15: wbc 5.1; hgb 10.8; hct 32.3; mcv 103.2; plt 101; glucose 125; bun 18; creat 1.09; k+ 3.9; na++ 142  01-04-16: wbc 4.1; hgb 10.9; hct 32.4; mcv 100.5; plt 175; glucose 121; bun 13.2; creat 1.18; k+ 3.8; na++ 144; liver normal albumin 3.8 01-31-16; urine culture: e-coli: ESBL invanz 02-15-16: wbc 4.6; hgb 11.3; hct 36.3; mcv 102.7; plt 128; glucose 160; bun 15.3; creat 1.65; k+ 4.5; na++ 143; liver normal albumin 3.9 02-19-16: glucose 116; bun 28.7; mcv 1.72; k+ 4.5; na++ 143 02-23-16; wbc 3.5 hgb 9.7; hct 30.4; mcv 101.8; plt 126; glucose 118; bun 25.6; creat 1.52; k+ 4.6; na++ 144     Review of Systems  Constitutional: Negative for appetite change and fatigue.  HENT: Negative for congestion.   Respiratory: Negative for cough, chest tightness and shortness of breath.   Cardiovascular: Negative for chest pain, palpitations and leg swelling.  Gastrointestinal: Negative for nausea, abdominal pain, diarrhea and constipation.  Musculoskeletal: no complaint of muscle or joint pain    Skin: Negative for pallor.  Neurological: Negative for dizziness.  Psychiatric/Behavioral: The patient is not nervous/anxious.       Physical Exam  Constitutional: No distress.  Eyes: Conjunctivae are normal.  Neck: Neck supple. No JVD present. No thyromegaly present.  Cardiovascular: Normal rate, regular rhythm and intact distal pulses.   Respiratory:  Effort normal and breath sounds normal. No respiratory distress. She has no wheezes.  Is 02 dependent  GI: Soft. Bowel sounds are normal.  no tenderness Musculoskeletal: She exhibits no edema.  Able to move all extremities .   Lymphadenopathy:    She has no cervical adenopathy.  Neurological: She is alert.  Skin: Skin is warm and dry. She is not diaphoretic.  Psychiatric: She has a normal mood and affect.     ASSESSMENT/ PLAN:  1. Hypertension: will continue norvasc 2.5 mg daily coreg 3.15 mg daily   2. Chronic diastolic heart failure: is presently not on diuretic  Will not make changes will monitor   3. Duodenal ulcer: will continue protonix 40 mg  daily    4. UTI: will continue cranberry daily   She has been treated for an e-coli uti in the hospital   5. Anemia: will continue iron daily hgb is 9.7  6. Chronic afib: will continue coreg 3.125 mg daily for rate control. Will continue asa 81mg  daily .  7. Chronic respiratory failure: is 02 dependent.   8. Allergic rhinitis: will continue flonase nightly and claritin 10 mg daily   9. Chronic pain due to osteoarthritis: will continue lidoderm to lower back daily; will continue baclofen 5 mg nightly for leg spasticity and will monitor   10. Depression: is stable receives benefit form lexapro 10 mg daily   11. Chronic constipation: has sigmoid volvulus: will continue colace 200 mg daily linzess 145 mcg daily; senna twice daily miralax daily   12. Glaucoma: will continue  trusopt to both eyes three times daily; travatan to both eyes nightly   13. Low back pain with radiculopathy: will continue neurontin 100 mg nightly for her bilateral foot burning/pain   14. CKD stage III: bun/creat25.6/1.52    Ok Edwards NP [facility]  Contact [phone] Monday through Friday 8am- 5pm  After hours call [phone]

## 2016-03-05 ENCOUNTER — Encounter (HOSPITAL_COMMUNITY): Payer: Self-pay | Admitting: Emergency Medicine

## 2016-03-05 ENCOUNTER — Emergency Department (HOSPITAL_COMMUNITY)
Admission: EM | Admit: 2016-03-05 | Discharge: 2016-03-06 | Disposition: A | Discharge status: Home / Self Care | Payer: Medicare Other | Attending: Physician Assistant | Admitting: Physician Assistant

## 2016-03-05 DIAGNOSIS — I5032 Chronic diastolic (congestive) heart failure: Secondary | ICD-10-CM | POA: Diagnosis not present

## 2016-03-05 DIAGNOSIS — N390 Urinary tract infection, site not specified: Secondary | ICD-10-CM | POA: Diagnosis not present

## 2016-03-05 DIAGNOSIS — Z87891 Personal history of nicotine dependence: Secondary | ICD-10-CM | POA: Insufficient documentation

## 2016-03-05 DIAGNOSIS — Z8673 Personal history of transient ischemic attack (TIA), and cerebral infarction without residual deficits: Secondary | ICD-10-CM | POA: Insufficient documentation

## 2016-03-05 DIAGNOSIS — E1140 Type 2 diabetes mellitus with diabetic neuropathy, unspecified: Secondary | ICD-10-CM | POA: Insufficient documentation

## 2016-03-05 DIAGNOSIS — I130 Hypertensive heart and chronic kidney disease with heart failure and stage 1 through stage 4 chronic kidney disease, or unspecified chronic kidney disease: Secondary | ICD-10-CM | POA: Insufficient documentation

## 2016-03-05 DIAGNOSIS — I2510 Atherosclerotic heart disease of native coronary artery without angina pectoris: Secondary | ICD-10-CM | POA: Diagnosis not present

## 2016-03-05 DIAGNOSIS — Z79899 Other long term (current) drug therapy: Secondary | ICD-10-CM | POA: Insufficient documentation

## 2016-03-05 DIAGNOSIS — R109 Unspecified abdominal pain: Secondary | ICD-10-CM

## 2016-03-05 DIAGNOSIS — E1122 Type 2 diabetes mellitus with diabetic chronic kidney disease: Secondary | ICD-10-CM | POA: Diagnosis not present

## 2016-03-05 DIAGNOSIS — Z96649 Presence of unspecified artificial hip joint: Secondary | ICD-10-CM | POA: Insufficient documentation

## 2016-03-05 DIAGNOSIS — N183 Chronic kidney disease, stage 3 (moderate): Secondary | ICD-10-CM | POA: Insufficient documentation

## 2016-03-05 DIAGNOSIS — G20 Parkinson's disease: Secondary | ICD-10-CM | POA: Insufficient documentation

## 2016-03-05 DIAGNOSIS — Z7982 Long term (current) use of aspirin: Secondary | ICD-10-CM | POA: Insufficient documentation

## 2016-03-05 NOTE — ED Notes (Signed)
Bed: [id number] Expected date:  Expected time:  Means of arrival:  Comments: 82 yo f abd pain

## 2016-03-05 NOTE — ED Triage Notes (Signed)
Brought in by EMS patient is from [facility], complain of abdominal pain,  EMS stated KUB was done in the facility and showed worsen ileus, pt stated last BM was today.

## 2016-03-05 NOTE — ED Provider Notes (Signed)
[facility] DEPT Provider Note   CSN: [id number] Arrival date & time: 03/05/16  2305   By signing my name below, I, Estanislado Pandy, attest that this documentation has been prepared under the direction and in the presence of Branna Cortina Julio Alm, MD . Electronically Signed: Estanislado Pandy, Scribe. 03/05/2016. 11:55 PM.   History   Chief Complaint Chief Complaint  Patient presents with  . Abdominal Pain  . Constipation    The history is provided by the patient. No language interpreter was used.   HPI Comments:  Maria Christian is a 82 y.o. female with PMHx of who presents to the Emergency Department via EMS complaining of abdominal swelling.  Per daughter, pt was recently at the ED for abdominal swelling. Daughter reports that pt has only urinated 1 time today, no BM. Pt denies vomiting, nausea, abdominal pain.  She has history of sigmoid volvulus. Per daughter did not have any surgery for correction, and daughter was expecting that would happen again.    Past Medical History:  Diagnosis Date  . Altered mental status   . Anemia   . Atrial fibrillation ([place])   . Constipation 02/27/2015  . Coronary artery disease   . DEMENTIA   . Depression   . Diabetes mellitus   . Edema of lower extremity 07/13/11   right leg more swollen than left leg  . Encephalopathy   . Enlarged heart   . Esophageal dysmotility 07/02/12  . GERD (gastroesophageal reflux disease)   . History of adenomatous polyp of colon 06/24/99  . Horseshoe kidney   . Hyperlipemia   . Hypertension   . Pancreatic lesion 05/22/11   no further workup  per PCP/family due to age  . Parkinson disease ([place])   . Peripheral neuropathy ([place])   . Sigmoid volvulus ([place]) 05/24/2013  . Thrombophlebitis   . TIA (transient ischemic attack) 12/19/2012    Patient Active Problem List   Diagnosis Date Noted  . Diarrhea 01/04/2016  . E. coli UTI 01/04/2016  . AKI (acute kidney injury) ([place]) 01/04/2016  . GERD  (gastroesophageal reflux disease) 01/04/2016  . DM (diabetes mellitus), type 2 ([place]) 01/04/2016  . Sigmoid volvulus ([place]) 12/20/2015  . Hypertensive heart disease with congestive heart failure ([place]) 12/02/2015  . Vitamin B12 deficiency 09/27/2015  . Skin yeast infection 05/10/2015  . Abrasion of buttock 05/10/2015  . Infection due to ESBL-producing Escherichia coli 04/19/2015  . Low back pain potentially associated with radiculopathy 04/18/2015  . Chronic respiratory failure ([place]) 03/16/2015  . Allergic rhinitis 03/16/2015  . Chronic pain 03/16/2015  . Osteoarthritis of multiple joints 03/16/2015  . Duodenal ulcer 03/12/2015  . GI bleed 03/11/2015  . Melena 03/11/2015  . Pressure ulcer 03/11/2015  . Headache 03/09/2015  . Abdominal distension 03/01/2015  . Cephalgia 03/01/2015  . Nausea and vomiting 02/27/2015  . Constipation 02/27/2015  . Anemia, iron deficiency 02/27/2015  . Anemia of chronic disease 02/27/2015  . Chronic atrial fibrillation ([place]) 02/27/2015  . Hypokalemia 02/27/2015  . CKD (chronic kidney disease) stage 3, GFR 30-59 ml/min 02/27/2015  . Ileus ([place]) 02/26/2015  . Malnutrition of moderate degree ([place]) 10/10/2013  . FTT (failure to thrive) in adult 07/29/2013  . Dementia 12/19/2012  . Depression 12/19/2012  . Coronary artery disease   . Diastolic CHF, chronic ([place]) 05/24/2011    Past Surgical History:  Procedure Laterality Date  . ABDOMINAL HYSTERECTOMY    . BREAST SURGERY     2 benign tumors removed left breast  .  CHOLECYSTECTOMY    . ESOPHAGEAL DILATION     several times by Dr. [name]  . ESOPHAGOGASTRODUODENOSCOPY (EGD) WITH ESOPHAGEAL DILATION N/A 08/01/2012   Procedure: ESOPHAGOGASTRODUODENOSCOPY (EGD) WITH ESOPHAGEAL DILATION;  Surgeon: [name], MD;  Location: [facility] ENDOSCOPY;  Service: Endoscopy;  Laterality: N/A;  with c-arm savory dilators  . ESOPHAGOGASTRODUODENOSCOPY (EGD) WITH PROPOFOL N/A 03/12/2015   Procedure:  ESOPHAGOGASTRODUODENOSCOPY (EGD) WITH PROPOFOL;  Surgeon: [name], MD;  Location: [facility];  Service: Endoscopy;  Laterality: N/A;  . FLEXIBLE SIGMOIDOSCOPY N/A 05/24/2013   Procedure: FLEXIBLE SIGMOIDOSCOPY;  Surgeon: [name], MD;  Location: [facility];  Service: Endoscopy;  Laterality: N/A;  . FLEXIBLE SIGMOIDOSCOPY N/A 05/26/2013   Procedure:  flex with decompression of sigmoid volvulus;  Surgeon: [name], MD;  Location: [facility];  Service: Endoscopy;  Laterality: N/A;  . FLEXIBLE SIGMOIDOSCOPY N/A 12/20/2015   Procedure: FLEXIBLE SIGMOIDOSCOPY;  Surgeon: Milus Banister, MD;  Location: [facility];  Service: Endoscopy;  Laterality: N/A;  . FOOT SURGERY     benign tumors from foot  . NOSE SURGERY    . SHOULDER SURGERY  2001   left clavicle excision and acromioplasty  . TOTAL HIP ARTHROPLASTY      OB History    No data available       Home Medications    Prior to Admission medications   Medication Sig Start Date End Date Taking? Authorizing Provider  acetaminophen (TYLENOL) 325 MG tablet Take 2 tablets (650 mg total) by mouth every 6 (six) hours as needed for mild pain. 11/03/14   Gerlene Fee, NP  amLODipine (NORVASC) 2.5 MG tablet Take 2.5 mg by mouth every morning. Hold for BP < 100/60 or HR < 60    Historical Provider, MD  aspirin EC 81 MG tablet Take 81 mg by mouth daily.    Historical Provider, MD  baclofen (LIORESAL) 10 MG tablet Take 5 mg by mouth at bedtime.    Historical Provider, MD  Biotin 5 MG/ML LIQD Take 5 mLs by mouth 2 (two) times daily.     Historical Provider, MD  carvedilol (COREG) 3.125 MG tablet Take 3.125 mg by mouth daily.    Historical Provider, MD  cholecalciferol (VITAMIN D) 1000 UNITS tablet Take 1,000 Units by mouth every morning.     Historical Provider, MD  Cranberry 475 MG CAPS Take 1 capsule by mouth 2 (two) times daily.     Historical Provider, MD  docusate sodium (COLACE) 100 MG capsule Take 200 mg by mouth at  bedtime.     Historical Provider, MD  dorzolamide (TRUSOPT) 2 % ophthalmic solution Place 1 drop into both eyes 3 (three) times daily. Reported on 11/16/2015        0800,1400,2000    Historical Provider, MD  escitalopram (LEXAPRO) 10 MG tablet Take 1 tablet (10 mg total) by mouth daily. 12/03/14   Gerlene Fee, NP  ferrous sulfate 325 (65 FE) MG tablet Take 325 mg by mouth daily with breakfast.    Historical Provider, MD  fluticasone (FLONASE) 50 MCG/ACT nasal spray Place 2 sprays into both nostrils at bedtime.    Historical Provider, MD  gabapentin (NEURONTIN) 100 MG capsule Take 200 mg by mouth at bedtime.     Historical Provider, MD  lidocaine (LIDODERM) 5 % Place 1 patch onto the skin daily. Apply 1 patch to left lower back/hip every morning.  Remove & Discard patch within 12 hours or as directed by MD 07/28/13  Thurnell Lose, MD  Linaclotide [facility]) 145 MCG CAPS capsule Take 145 mcg by mouth daily before breakfast.     Historical Provider, MD  loratadine (CLARITIN) 10 MG tablet Take 10 mg by mouth daily before breakfast.     Historical Provider, MD  Multiple Vitamins-Minerals (CERTAGEN PO) Take 1 tablet by mouth daily.     Historical Provider, MD  ondansetron (ZOFRAN) 8 MG tablet Take 8 mg by mouth every 8 (eight) hours as needed for nausea or vomiting.    Historical Provider, MD  OXYGEN Inhale 2 L into the lungs continuous.    Historical Provider, MD  polyethylene glycol (MIRALAX / GLYCOLAX) packet Take 17 g by mouth daily at 6 (six) AM.     Historical Provider, MD  potassium chloride SA (K-DUR,KLOR-CON) 20 MEQ tablet Take 40 mEq by mouth 2 (two) times daily.  10/23/13   Janece Canterbury, MD  PROTONIX 40 MG PACK Take 40 mg by mouth daily at 6 (six) AM. 12/06/15   Historical Provider, MD  senna (SENOKOT) 8.6 MG TABS tablet Take 1 tablet (8.6 mg total) by mouth 2 (two) times daily. 01/05/14   Barton Dubois, MD  Skin Protectants, Misc. (CALAZIME SKIN PROTECTANT EX) Apply 1 application topically as  directed. Cleanse buttocks and apply paste daily and as needed every day and night shift for protection.    Historical Provider, MD  sodium chloride (OCEAN) 0.65 % SOLN nasal spray Place 1 spray into both nostrils as directed. Four times daily And every 24 hours as needed to moisturize nasal passages.    Historical Provider, MD  Travoprost, BAK Free, (TRAVATAN) 0.004 % SOLN ophthalmic solution Place 1 drop into both eyes daily at 8 pm.     Historical Provider, MD  vitamin B-12 (CYANOCOBALAMIN) 500 MCG tablet Take 500 mcg by mouth every morning.     Historical Provider, MD    Family History Family History  Problem Relation Age of Onset  . Cancer    . Heart disease      Social History Social History  Substance Use Topics  . Smoking status: Former Smoker    Quit date: 10/09/1964  . Smokeless tobacco: Never Used  . Alcohol use No     Allergies   Aspirin   Review of Systems Review of Systems  All other systems reviewed and are negative.    Physical Exam Updated Vital Signs BP 131/68   Pulse 63   Temp 98.3 F (36.8 C)   Resp 16   SpO2 98%   Physical Exam  Constitutional: She appears well-developed and well-nourished. No distress.  HENT:  Head: Normocephalic and atraumatic.  Eyes: Conjunctivae are normal.  Cardiovascular: Normal rate.   Pulmonary/Chest: Effort normal.  Abdominal: She exhibits no distension.  Swelling to R side of abdomen with tenderness to palpation.    Neurological: She is alert.  Skin: Skin is warm and dry.  Psychiatric: She has a normal mood and affect.  Nursing note and vitals reviewed.    ED Treatments / Results  DIAGNOSTIC STUDIES:  Oxygen Saturation is 98% on RA, normal by my interpretation.    COORDINATION OF CARE:  11:55 PM Discussed treatment plan with pt at bedside and pt agreed to plan.   Labs (all labs ordered are listed, but only abnormal results are displayed) Labs Reviewed - No data to display  EKG  EKG  Interpretation None       Radiology No results found.  Procedures Procedures (including critical care time)  Medications Ordered in ED Medications - No data to display   Initial Impression / Assessment and Plan / ED Course  I have reviewed the triage vital signs and the nursing notes.  Pertinent labs & imaging results that were available during my care of the patient were reviewed by me and considered in my medical decision making (see chart for details).  Clinical Course   Patient is an 82 year old female with history of sigmoid volvulus, lives in nursing home. She is presenting today with abdominal swelling. She had an x-ray that showed questionable ileus at facility .  We'll get labs, CT scan to better evaluate.  In reviewing her history patient appears to have chronically twisted sigmoid with multiple recurrences of sigmoid volvulus. Last admission in July she had a flexible sigmoidoscopy decompression done by Dr. Ardis Hughs from [facility] gastroenterology. She was also seen by Dr. Excell Seltzer from general surgery and felt to be a poor surgical candidate time.  Pt CT showed no volvulus or obstruction. Pt did have UTI, will treat accordingly.  Taking PO and afebrile, so safe to try initial treatment at nursing home.   Final Clinical Impressions(s) / ED Diagnoses   Final diagnoses:  None    New Prescriptions New Prescriptions   No medications on file  I personally performed the services described in this documentation, which was scribed in my presence. The recorded information has been reviewed and is accurate.        Lagretta Loseke Julio Alm, MD 03/06/16 [phone]

## 2016-03-06 ENCOUNTER — Emergency Department (HOSPITAL_COMMUNITY): Payer: Medicare Other

## 2016-03-06 ENCOUNTER — Non-Acute Institutional Stay (SKILLED_NURSING_FACILITY): Payer: Medicare Other | Admitting: Internal Medicine

## 2016-03-06 ENCOUNTER — Encounter: Payer: Self-pay | Admitting: Internal Medicine

## 2016-03-06 DIAGNOSIS — K5901 Slow transit constipation: Secondary | ICD-10-CM | POA: Diagnosis not present

## 2016-03-06 DIAGNOSIS — K562 Volvulus: Secondary | ICD-10-CM | POA: Diagnosis not present

## 2016-03-06 DIAGNOSIS — F0390 Unspecified dementia without behavioral disturbance: Secondary | ICD-10-CM

## 2016-03-06 DIAGNOSIS — R109 Unspecified abdominal pain: Secondary | ICD-10-CM | POA: Diagnosis not present

## 2016-03-06 DIAGNOSIS — N390 Urinary tract infection, site not specified: Secondary | ICD-10-CM | POA: Diagnosis not present

## 2016-03-06 DIAGNOSIS — R140 Abdominal distension (gaseous): Secondary | ICD-10-CM | POA: Diagnosis not present

## 2016-03-06 LAB — CBC WITH DIFFERENTIAL/PLATELET
BASOS ABS: 0 10*3/uL (ref 0.0–0.1)
Basophils Relative: 0 %
EOS ABS: 0.2 10*3/uL (ref 0.0–0.7)
EOS PCT: 5 %
HCT: 29.5 % — ABNORMAL LOW (ref 36.0–46.0)
Hemoglobin: 9.8 g/dL — ABNORMAL LOW (ref 12.0–15.0)
LYMPHS ABS: 1.5 10*3/uL (ref 0.7–4.0)
LYMPHS PCT: 40 %
MCH: 33.3 pg (ref 26.0–34.0)
MCHC: 33.2 g/dL (ref 30.0–36.0)
MCV: 100.3 fL — AB (ref 78.0–100.0)
MONO ABS: 0.4 10*3/uL (ref 0.1–1.0)
Monocytes Relative: 10 %
Neutro Abs: 1.7 10*3/uL (ref 1.7–7.7)
Neutrophils Relative %: 45 %
PLATELETS: 106 10*3/uL — AB (ref 150–400)
RBC: 2.94 MIL/uL — ABNORMAL LOW (ref 3.87–5.11)
RDW: 14.5 % (ref 11.5–15.5)
WBC: 3.8 10*3/uL — AB (ref 4.0–10.5)

## 2016-03-06 LAB — URINALYSIS, ROUTINE W REFLEX MICROSCOPIC
BILIRUBIN URINE: NEGATIVE
GLUCOSE, UA: NEGATIVE mg/dL
Ketones, ur: NEGATIVE mg/dL
Nitrite: NEGATIVE
PROTEIN: 30 mg/dL — AB
SPECIFIC GRAVITY, URINE: 1.013 (ref 1.005–1.030)
pH: 5.5 (ref 5.0–8.0)

## 2016-03-06 LAB — COMPREHENSIVE METABOLIC PANEL
ALBUMIN: 3.3 g/dL — AB (ref 3.5–5.0)
ALT: 15 U/L (ref 14–54)
AST: 29 U/L (ref 15–41)
Alkaline Phosphatase: 82 U/L (ref 38–126)
Anion gap: 6 (ref 5–15)
BILIRUBIN TOTAL: 0.8 mg/dL (ref 0.3–1.2)
BUN: 23 mg/dL — AB (ref 6–20)
CO2: 22 mmol/L (ref 22–32)
CREATININE: 1.4 mg/dL — AB (ref 0.44–1.00)
Calcium: 8.6 mg/dL — ABNORMAL LOW (ref 8.9–10.3)
Chloride: 115 mmol/L — ABNORMAL HIGH (ref 101–111)
GFR calc Af Amer: 37 mL/min — ABNORMAL LOW (ref >60)
GFR, EST NON AFRICAN AMERICAN: 32 mL/min — AB (ref >60)
GLUCOSE: 120 mg/dL — AB (ref 65–99)
POTASSIUM: 4.2 mmol/L (ref 3.5–5.1)
Sodium: 143 mmol/L (ref 135–145)
TOTAL PROTEIN: 6.6 g/dL (ref 6.5–8.1)

## 2016-03-06 LAB — URINE MICROSCOPIC-ADD ON

## 2016-03-06 LAB — I-STAT TROPONIN, ED: Troponin i, poc: 0.03 ng/mL (ref 0.00–0.08)

## 2016-03-06 LAB — I-STAT CG4 LACTIC ACID, ED: LACTIC ACID, VENOUS: 1.01 mmol/L (ref 0.5–1.9)

## 2016-03-06 MED ORDER — CEPHALEXIN 500 MG PO CAPS: 500.0000 mg | ORAL_CAPSULE | Freq: Four times a day (QID) | ORAL | 0 refills | Status: DC

## 2016-03-06 MED ORDER — CEPHALEXIN 500 MG PO CAPS
500.0000 mg | ORAL_CAPSULE | Freq: Once | ORAL | Status: AC
  Administered 2016-03-06: 500 mg via ORAL
  Filled 2016-03-06: qty 1

## 2016-03-06 NOTE — Discharge Instructions (Signed)
[name] was seen here today. For abdominal pain. She's found to have a urinary tract infection. CT shows no issues with the sigmoid bowel.

## 2016-03-06 NOTE — Progress Notes (Signed)
Patient ID: Maria Christian, female   DOB: [birth year], 82 y.o.   MRN: [id number]    DATE:  03/06/2016  Location:    [facility] Room Number: 537 A Place of Service: SNF (31)   Extended Emergency Contact Information Primary Emergency Contact: [name] [place] Phone: [phone] Relation: Son Secondary Emergency Contact: Roberts,Lynn Address: [address]          [place], [place] [address] [place] Phone: [phone] Mobile Phone: [phone] Relation: Daughter  Advanced Directive information Does patient have an advance directive?: Yes, Type of Advance Directive: Out of facility DNR (pink MOST or yellow form), Does patient want to make changes to advanced directive?: No - Patient declined  Chief Complaint  Patient presents with  . Follow-up - ED 03/05/16    HPI:  82 yo female long term resident seen today for ED f/u. She presented with abdominal pain. CT abd/pelvis revealed no volvulus or colonic obstruction but did show moderate stool throughout the colon and horseshoe kidney with possible pyelonephritis. She was Rx keflex 565m po QID x 5 days. Albumin 3.3. Cr 1.4. She is a poor historian due to dementia. Hx obtained from chart.  Hypertension - BP stable on norvasc 2.5 mg daily; coreg 3.125 mg daily   Hx Chronic diastolic heart failure - stable off diuretic   Hx Duodenal ulcer - stable on protonix 40 mg  daily    Recurrent UTI - takes cranberry tabs daily for prevention.     Chronic pain due to osteoarthritis - stable on lidoderm to lower back daily;  baclofen 5 mg nightly for leg spasticity  Chronic constipation - chronic sigmoid volvulus. Takes colace 200 mg daily; linzess 145 mcg daily; senna twice daily; miralax daily   CKD - recent Cr 1.4 indicative of stage 3 disease  Past Medical History:  Diagnosis Date  . Altered mental status   . Anemia   . Atrial fibrillation ([facility]   . Constipation 02/27/2015  .  Coronary artery disease   . DEMENTIA   . Depression   . Diabetes mellitus   . Edema of lower extremity 07/13/11   right leg more swollen than left leg  . Encephalopathy   . Enlarged heart   . Esophageal dysmotility 07/02/12  . GERD (gastroesophageal reflux disease)   . History of adenomatous polyp of colon 06/24/99  . Horseshoe kidney   . Hyperlipemia   . Hypertension   . Pancreatic lesion 05/22/11   no further workup  per PCP/family due to age  . Parkinson disease ([facility]   . Peripheral neuropathy ([facility]   . Sigmoid volvulus ([facility] 05/24/2013  . Thrombophlebitis   . TIA (transient ischemic attack) 12/19/2012    Past Surgical History:  Procedure Laterality Date  . ABDOMINAL HYSTERECTOMY    . BREAST SURGERY     2 benign tumors removed left breast  . CHOLECYSTECTOMY    . ESOPHAGEAL DILATION     several times by Dr. SLyla Son . ESOPHAGOGASTRODUODENOSCOPY (EGD) WITH ESOPHAGEAL DILATION N/A 08/01/2012   Procedure: ESOPHAGOGASTRODUODENOSCOPY (EGD) WITH ESOPHAGEAL DILATION;  Surgeon: DLafayette Dragon MD;  Location: [facility];  Service: Endoscopy;  Laterality: N/A;  with c-arm savory dilators  . ESOPHAGOGASTRODUODENOSCOPY (EGD) WITH PROPOFOL N/A 03/12/2015   Procedure: ESOPHAGOGASTRODUODENOSCOPY (EGD) WITH PROPOFOL;  Surgeon: RInda Castle MD;  Location: [facility];  Service: Endoscopy;  Laterality: N/A;  . FLEXIBLE SIGMOIDOSCOPY N/A 05/24/2013   Procedure: FLEXIBLE SIGMOIDOSCOPY;  Surgeon:  [name], MD;  Location: [facility];  Service: Endoscopy;  Laterality: N/A;  . FLEXIBLE SIGMOIDOSCOPY N/A 05/26/2013   Procedure:  flex with decompression of sigmoid volvulus;  Surgeon: [name], MD;  Location: [facility];  Service: Endoscopy;  Laterality: N/A;  . FLEXIBLE SIGMOIDOSCOPY N/A 12/20/2015   Procedure: FLEXIBLE SIGMOIDOSCOPY;  Surgeon: Milus Banister, MD;  Location: [facility];  Service: Endoscopy;  Laterality: N/A;  . FOOT SURGERY     benign tumors from foot  . NOSE  SURGERY    . SHOULDER SURGERY  2001   left clavicle excision and acromioplasty  . TOTAL HIP ARTHROPLASTY      Patient Care Team: [name], DO as PCP - General (Internal Medicine) Gerlene Fee, NP as Nurse Practitioner (Nurse Practitioner)  Social History   Social History  . Marital status: Widowed    Spouse name: N/A  . Number of children: 5  . Years of education: 10th   Occupational History  . Retired    Social History Main Topics  . Smoking status: Former Smoker    Quit date: 10/09/1964  . Smokeless tobacco: Never Used  . Alcohol use No  . Drug use: No  . Sexual activity: No   Other Topics Concern  . Not on file   Social History Narrative   Pt lives at [facility] and [place].   Caffeine Use: very small amount daily     reports that she quit smoking about 51 years ago. She has never used smokeless tobacco. She reports that she does not drink alcohol or use drugs.  Family History  Problem Relation Age of Onset  . Cancer    . Heart disease     Family Status  Relation Status  . Mother Deceased at age 80   blood clots  . Father Deceased at age 85   cirrohis of the liver  .      Immunization History  Administered Date(s) Administered  . Influenza-Unspecified 07/13/2015  . PPD Test 07/28/2013, 02/18/2016    Allergies  Allergen Reactions  . Aspirin Other (See Comments)    G.I. Upset only    Medications: Patient's Medications  New Prescriptions   No medications on file  Previous Medications   ACETAMINOPHEN (TYLENOL) 325 MG TABLET    Take 2 tablets (650 mg total) by mouth every 6 (six) hours as needed for mild pain.   AMLODIPINE (NORVASC) 2.5 MG TABLET    Take 2.5 mg by mouth every morning. Hold for BP < 100/60 or HR < 60   ASPIRIN EC 81 MG TABLET    Take 81 mg by mouth daily.   BACLOFEN (LIORESAL) 10 MG TABLET    Take 5 mg by mouth at bedtime.   BIOTIN 5 MG/ML LIQD    Take 5 mLs by mouth 2 (two) times daily.    CARVEDILOL (COREG) 3.125  MG TABLET    Take 3.125 mg by mouth daily.   CEPHALEXIN (KEFLEX) 500 MG CAPSULE    Take 1 capsule (500 mg total) by mouth 4 (four) times daily.   CHOLECALCIFEROL (VITAMIN D) 1000 UNITS TABLET    Take 1,000 Units by mouth every morning.    CRANBERRY 475 MG CAPS    Take 1 capsule by mouth 2 (two) times daily.    DOCUSATE SODIUM (COLACE) 100 MG CAPSULE    Take 200 mg by mouth at bedtime.    DORZOLAMIDE (TRUSOPT) 2 % OPHTHALMIC SOLUTION    Place  1 drop into both eyes 3 (three) times daily. Reported on 11/16/2015        0800,1400,2000   ESCITALOPRAM (LEXAPRO) 10 MG TABLET    Take 1 tablet (10 mg total) by mouth daily.   FERROUS SULFATE 325 (65 FE) MG TABLET    Take 325 mg by mouth daily with breakfast.   FLUTICASONE (FLONASE) 50 MCG/ACT NASAL SPRAY    Place 2 sprays into both nostrils at bedtime.   GABAPENTIN (NEURONTIN) 100 MG CAPSULE    Take 200 mg by mouth at bedtime.    LIDOCAINE (LIDODERM) 5 %    Place 1 patch onto the skin daily. Apply 1 patch to left lower back/hip every morning.  Remove & Discard patch within 12 hours or as directed by MD   LINACLOTIDE (LINZESS) 145 MCG CAPS CAPSULE    Take 145 mcg by mouth daily before breakfast.    LORATADINE (CLARITIN) 10 MG TABLET    Take 10 mg by mouth daily before breakfast.    MULTIPLE VITAMINS-MINERALS (CERTAGEN PO)    Take 1 tablet by mouth daily.    ONDANSETRON (ZOFRAN) 8 MG TABLET    Take 8 mg by mouth every 8 (eight) hours as needed for nausea or vomiting.   OXYGEN    Inhale 2 L into the lungs continuous.   POLYETHYLENE GLYCOL (MIRALAX / GLYCOLAX) PACKET    Take 17 g by mouth daily at 6 (six) AM.    POTASSIUM CHLORIDE SA (K-DUR,KLOR-CON) 20 MEQ TABLET    Take 20 mEq by mouth daily.    PROTONIX 40 MG PACK    Take 40 mg by mouth daily at 6 (six) AM.   SENNA (SENOKOT) 8.6 MG TABS TABLET    Take 1 tablet (8.6 mg total) by mouth 2 (two) times daily.   SODIUM CHLORIDE (OCEAN) 0.65 % SOLN NASAL SPRAY    Place 1 spray into both nostrils as directed. Four  times daily And every 24 hours as needed to moisturize nasal passages.   TRAVOPROST, BAK FREE, (TRAVATAN) 0.004 % SOLN OPHTHALMIC SOLUTION    Place 1 drop into both eyes daily at 8 pm.    VITAMIN B-12 (CYANOCOBALAMIN) 500 MCG TABLET    Take 500 mcg by mouth every morning.   Modified Medications   No medications on file  Discontinued Medications   No medications on file    Review of Systems  Unable to perform ROS: Dementia    Vitals:   03/06/16 1101  BP: 136/72  Pulse: 74  Resp: 18  Temp: 97.9 F (36.6 C)  TempSrc: Oral  SpO2: 98%  Weight: 153 lb (69.4 kg)  Height: 5' 7" (1.702 m)   Body mass index is 23.96 kg/m.  Physical Exam  Constitutional: She appears well-developed.  Frail appearing, lying in bed in NAD  HENT:  Mouth/Throat: Oropharynx is clear and moist. No oropharyngeal exudate.  MMM  Eyes: Pupils are equal, round, and reactive to light. No scleral icterus.  Neck: Neck supple. Carotid bruit is not present. No tracheal deviation present. No thyromegaly present.  Cardiovascular: Normal rate, regular rhythm and intact distal pulses.  Exam reveals no gallop and no friction rub.   Murmur (1/6 SEM) heard. No LE edema b/l. No calf TTP  Pulmonary/Chest: Effort normal and breath sounds normal. No stridor. No respiratory distress. She has no wheezes. She has no rales.  Abdominal: Soft. Bowel sounds are normal. She exhibits distension. She exhibits no mass. There is no hepatomegaly. There is  no tenderness. There is no rebound and no guarding.  (+) tympany to percussion  Musculoskeletal: She exhibits edema.  Lymphadenopathy:    She has no cervical adenopathy.  Neurological: She is alert.  Skin: Skin is warm and dry. No rash noted.  Psychiatric: She has a normal mood and affect. Her behavior is normal.     Labs reviewed: Admission on 03/05/2016, Discharged on 03/06/2016  Component Date Value Ref Range Status  . Sodium 03/06/2016 143  135 - 145 mmol/L Final  .  Potassium 03/06/2016 4.2  3.5 - 5.1 mmol/L Final  . Chloride 03/06/2016 115* 101 - 111 mmol/L Final  . CO2 03/06/2016 22  22 - 32 mmol/L Final  . Glucose, Bld 03/06/2016 120* 65 - 99 mg/dL Final  . BUN 03/06/2016 23* 6 - 20 mg/dL Final  . Creatinine, Ser 03/06/2016 1.40* 0.44 - 1.00 mg/dL Final  . Calcium 03/06/2016 8.6* 8.9 - 10.3 mg/dL Final  . Total Protein 03/06/2016 6.6  6.5 - 8.1 g/dL Final  . Albumin 03/06/2016 3.3* 3.5 - 5.0 g/dL Final  . AST 03/06/2016 29  15 - 41 U/L Final  . ALT 03/06/2016 15  14 - 54 U/L Final  . Alkaline Phosphatase 03/06/2016 82  38 - 126 U/L Final  . Total Bilirubin 03/06/2016 0.8  0.3 - 1.2 mg/dL Final  . GFR calc non Af Amer 03/06/2016 32* >60 mL/min Final  . GFR calc Af Amer 03/06/2016 37* >60 mL/min Final   Comment: (NOTE) The eGFR has been calculated using the CKD EPI equation. This calculation has not been validated in all clinical situations. eGFR's persistently <60 mL/min signify possible Chronic Kidney Disease.   . Anion gap 03/06/2016 6  5 - 15 Final  . WBC 03/06/2016 3.8* 4.0 - 10.5 K/uL Final  . RBC 03/06/2016 2.94* 3.87 - 5.11 MIL/uL Final  . Hemoglobin 03/06/2016 9.8* 12.0 - 15.0 g/dL Final  . HCT 03/06/2016 29.5* 36.0 - 46.0 % Final  . MCV 03/06/2016 100.3* 78.0 - 100.0 fL Final  . MCH 03/06/2016 33.3  26.0 - 34.0 pg Final  . MCHC 03/06/2016 33.2  30.0 - 36.0 g/dL Final  . RDW 03/06/2016 14.5  11.5 - 15.5 % Final  . Platelets 03/06/2016 106* 150 - 400 K/uL Final   Comment: SPECIMEN CHECKED FOR CLOTS REPEATED TO VERIFY PLATELET COUNT CONFIRMED BY SMEAR   . Neutrophils Relative % 03/06/2016 45  % Final  . Neutro Abs 03/06/2016 1.7  1.7 - 7.7 K/uL Final  . Lymphocytes Relative 03/06/2016 40  % Final  . Lymphs Abs 03/06/2016 1.5  0.7 - 4.0 K/uL Final  . Monocytes Relative 03/06/2016 10  % Final  . Monocytes Absolute 03/06/2016 0.4  0.1 - 1.0 K/uL Final  . Eosinophils Relative 03/06/2016 5  % Final  . Eosinophils Absolute 03/06/2016  0.2  0.0 - 0.7 K/uL Final  . Basophils Relative 03/06/2016 0  % Final  . Basophils Absolute 03/06/2016 0.0  0.0 - 0.1 K/uL Final  . Lactic Acid, Venous 03/06/2016 1.01  0.5 - 1.9 mmol/L Final  . Troponin i, poc 03/06/2016 0.03  0.00 - 0.08 ng/mL Final  . Comment 3 03/06/2016          Final   Comment: Due to the release kinetics of cTnI, a negative result within the first hours of the onset of symptoms does not rule out myocardial infarction with certainty. If myocardial infarction is still suspected, repeat the test at appropriate intervals.   Marland Kitchen  Color, Urine 03/06/2016 YELLOW  YELLOW Final  . APPearance 03/06/2016 TURBID* CLEAR Final  . Specific Gravity, Urine 03/06/2016 1.013  1.005 - 1.030 Final  . pH 03/06/2016 5.5  5.0 - 8.0 Final  . Glucose, UA 03/06/2016 NEGATIVE  NEGATIVE mg/dL Final  . Hgb urine dipstick 03/06/2016 MODERATE* NEGATIVE Final  . Bilirubin Urine 03/06/2016 NEGATIVE  NEGATIVE Final  . Ketones, ur 03/06/2016 NEGATIVE  NEGATIVE mg/dL Final  . Protein, ur 03/06/2016 30* NEGATIVE mg/dL Final  . Nitrite 03/06/2016 NEGATIVE  NEGATIVE Final  . Leukocytes, UA 03/06/2016 LARGE* NEGATIVE Final  . Squamous Epithelial / LPF 03/06/2016 0-5* NONE SEEN Final  . WBC, UA 03/06/2016 TOO NUMEROUS TO COUNT  0 - 5 WBC/hpf Final  . RBC / HPF 03/06/2016 0-5  0 - 5 RBC/hpf Final  . Bacteria, UA 03/06/2016 MANY* NONE SEEN Final  Nursing Home on 03/01/2016  Component Date Value Ref Range Status  . Hemoglobin 02/23/2016 9.7* 12.0 - 16.0 g/dL Final  . HCT 02/23/2016 31* 36 - 46 % Final  . Platelets 02/23/2016 126* 150 - 399 K/L Final  . WBC 02/23/2016 3.5  10^3/mL Final  . Glucose 02/23/2016 118  mg/dL Final  . BUN 02/23/2016 26* 4 - 21 mg/dL Final  . Creatinine 02/23/2016 1.5* 0.5 - 1.1 mg/dL Final  . Potassium 02/23/2016 4.6  3.4 - 5.3 mmol/L Final  . Sodium 02/23/2016 144  137 - 147 mmol/L Final  Nursing Home on 02/22/2016  Component Date Value Ref Range Status  . Hemoglobin  02/15/2016 11.3* 12.0 - 16.0 g/dL Final  . HCT 02/15/2016 36  36 - 46 % Final  . Platelets 02/15/2016 128* 150 - 399 K/L Final  . WBC 02/15/2016 4.6  10^3/mL Final  . Glucose 02/15/2016 160  mg/dL Final  . BUN 02/15/2016 19  4 - 21 mg/dL Final  . Creatinine 02/15/2016 1.5* 0.5 - 1.1 mg/dL Final  . Potassium 02/15/2016 4.5  3.4 - 5.3 mmol/L Final  . Sodium 02/15/2016 143  137 - 147 mmol/L Final  . Alkaline Phosphatase 02/15/2016 107  25 - 125 U/L Final  . ALT 02/15/2016 11  7 - 35 U/L Final  . AST 02/15/2016 20  13 - 35 U/L Final  . Bilirubin, Total 02/15/2016 0.4  mg/dL Final  Erroneous Encounter on 01/05/2016  Component Date Value Ref Range Status  . Hemoglobin 01/04/2016 10.9* 12.0 - 16.0 g/dL Final  . HCT 01/04/2016 32* 36 - 46 % Final  . Platelets 01/04/2016 175  150 - 399 K/L Final  . WBC 01/04/2016 4.1  10^3/mL Final  . Glucose 01/04/2016 121  mg/dL Final  . BUN 01/04/2016 13  4 - 21 mg/dL Final  . Creatinine 01/04/2016 1.2* 0.5 - 1.1 mg/dL Final  . Potassium 01/04/2016 3.8  3.4 - 5.3 mmol/L Final  . Sodium 01/04/2016 144  137 - 147 mmol/L Final  . Alkaline Phosphatase 01/04/2016 91  25 - 125 U/L Final  . ALT 01/04/2016 9  7 - 35 U/L Final  . AST 01/04/2016 15  13 - 35 U/L Final  . Bilirubin, Total 01/04/2016 0.3  mg/dL Final  Nursing Home on 12/27/2015  Component Date Value Ref Range Status  . Hemoglobin 12/21/2015 9.8* 12.0 - 16.0 g/dL Final  . HCT 12/21/2015 30* 36 - 46 % Final  . Platelets 12/21/2015 98* 150 - 399 K/L Final  . WBC 12/21/2015 3.9  10^3/mL Final  . WBC 12/22/2015 5.1  10^3/mL Final  . Glucose 12/22/2015  125  mg/dL Final  . BUN 12/22/2015 18  4 - 21 mg/dL Final  . Creatinine 12/22/2015 1.1  0.5 - 1.1 mg/dL Final  . Sodium 12/22/2015 142  137 - 147 mmol/L Final  . Bilirubin, Total 12/22/2015 0.8  mg/dL Final  Admission on 12/20/2015, Discharged on 12/23/2015  Component Date Value Ref Range Status  . Lipase 12/20/2015 30  11 - 51 U/L Final  . Sodium  12/20/2015 138  135 - 145 mmol/L Final  . Potassium 12/20/2015 3.2* 3.5 - 5.1 mmol/L Final  . Chloride 12/20/2015 111  101 - 111 mmol/L Final  . CO2 12/20/2015 21* 22 - 32 mmol/L Final  . Glucose, Bld 12/20/2015 138* 65 - 99 mg/dL Final  . BUN 12/20/2015 31* 6 - 20 mg/dL Final  . Creatinine, Ser 12/20/2015 1.61* 0.44 - 1.00 mg/dL Final  . Calcium 12/20/2015 8.9  8.9 - 10.3 mg/dL Final  . Total Protein 12/20/2015 7.1  6.5 - 8.1 g/dL Final  . Albumin 12/20/2015 3.9  3.5 - 5.0 g/dL Final  . AST 12/20/2015 22  15 - 41 U/L Final  . ALT 12/20/2015 13* 14 - 54 U/L Final  . Alkaline Phosphatase 12/20/2015 95  38 - 126 U/L Final  . Total Bilirubin 12/20/2015 0.8  0.3 - 1.2 mg/dL Final  . GFR calc non Af Amer 12/20/2015 27* >60 mL/min Final  . GFR calc Af Amer 12/20/2015 32* >60 mL/min Final   Comment: (NOTE) The eGFR has been calculated using the CKD EPI equation. This calculation has not been validated in all clinical situations. eGFR's persistently <60 mL/min signify possible Chronic Kidney Disease.   . Anion gap 12/20/2015 6  5 - 15 Final  . WBC 12/20/2015 5.2  4.0 - 10.5 K/uL Final  . RBC 12/20/2015 3.20* 3.87 - 5.11 MIL/uL Final  . Hemoglobin 12/20/2015 11.0* 12.0 - 15.0 g/dL Final  . HCT 12/20/2015 32.4* 36.0 - 46.0 % Final  . MCV 12/20/2015 101.3* 78.0 - 100.0 fL Final  . MCH 12/20/2015 34.4* 26.0 - 34.0 pg Final  . MCHC 12/20/2015 34.0  30.0 - 36.0 g/dL Final  . RDW 12/20/2015 13.8  11.5 - 15.5 % Final  . Platelets 12/20/2015 107* 150 - 400 K/uL Final   Comment: SPECIMEN CHECKED FOR CLOTS REPEATED TO VERIFY PLATELET COUNT CONFIRMED BY SMEAR   . Color, Urine 12/20/2015 YELLOW  YELLOW Final  . APPearance 12/20/2015 TURBID* CLEAR Final  . Specific Gravity, Urine 12/20/2015 1.013  1.005 - 1.030 Final  . pH 12/20/2015 5.5  5.0 - 8.0 Final  . Glucose, UA 12/20/2015 NEGATIVE  NEGATIVE mg/dL Final  . Hgb urine dipstick 12/20/2015 MODERATE* NEGATIVE Final  . Bilirubin Urine 12/20/2015  NEGATIVE  NEGATIVE Final  . Ketones, ur 12/20/2015 NEGATIVE  NEGATIVE mg/dL Final  . Protein, ur 12/20/2015 30* NEGATIVE mg/dL Final  . Nitrite 12/20/2015 POSITIVE* NEGATIVE Final  . Leukocytes, UA 12/20/2015 LARGE* NEGATIVE Final  . Lactic Acid, Venous 12/20/2015 0.85  0.5 - 1.9 mmol/L Final  . Lactic Acid, Venous 12/20/2015 0.65  0.5 - 1.9 mmol/L Final  . Squamous Epithelial / LPF 12/20/2015 0-5* NONE SEEN Final  . WBC, UA 12/20/2015 TOO NUMEROUS TO COUNT  0 - 5 WBC/hpf Final  . RBC / HPF 12/20/2015 6-30  0 - 5 RBC/hpf Final  . Bacteria, UA 12/20/2015 MANY* NONE SEEN Final  . Specimen Description 12/22/2015 URINE, CLEAN CATCH   Final  . Special Requests 12/22/2015 NONE   Final  . Culture  12/22/2015 >=100,000 COLONIES/mL ESCHERICHIA COLI*  Final  . Report Status 12/22/2015 12/22/2015 FINAL   Final  . Organism ID, Bacteria 12/22/2015 ESCHERICHIA COLI*  Final  . WBC 12/21/2015 3.9* 4.0 - 10.5 K/uL Final  . RBC 12/21/2015 2.88* 3.87 - 5.11 MIL/uL Final  . Hemoglobin 12/21/2015 9.8* 12.0 - 15.0 g/dL Final  . HCT 12/21/2015 29.7* 36.0 - 46.0 % Final  . MCV 12/21/2015 103.1* 78.0 - 100.0 fL Final  . MCH 12/21/2015 34.0  26.0 - 34.0 pg Final  . MCHC 12/21/2015 33.0  30.0 - 36.0 g/dL Final  . RDW 12/21/2015 14.3  11.5 - 15.5 % Final  . Platelets 12/21/2015 98* 150 - 400 K/uL Final  . Sodium 12/21/2015 143  135 - 145 mmol/L Final  . Potassium 12/21/2015 3.2* 3.5 - 5.1 mmol/L Final  . Chloride 12/21/2015 117* 101 - 111 mmol/L Final  . CO2 12/21/2015 22  22 - 32 mmol/L Final  . Glucose, Bld 12/21/2015 108* 65 - 99 mg/dL Final  . BUN 12/21/2015 23* 6 - 20 mg/dL Final  . Creatinine, Ser 12/21/2015 1.15* 0.44 - 1.00 mg/dL Final  . Calcium 12/21/2015 8.4* 8.9 - 10.3 mg/dL Final  . GFR calc non Af Amer 12/21/2015 41* >60 mL/min Final  . GFR calc Af Amer 12/21/2015 47* >60 mL/min Final   Comment: (NOTE) The eGFR has been calculated using the CKD EPI equation. This calculation has not been  validated in all clinical situations. eGFR's persistently <60 mL/min signify possible Chronic Kidney Disease.   . Anion gap 12/21/2015 4* 5 - 15 Final  . Magnesium 12/21/2015 2.4  1.7 - 2.4 mg/dL Final  . MRSA by PCR 12/20/2015 NEGATIVE  NEGATIVE Final   Comment:        The GeneXpert MRSA Assay (FDA approved for NASAL specimens only), is one component of a comprehensive MRSA colonization surveillance program. It is not intended to diagnose MRSA infection nor to guide or monitor treatment for MRSA infections.   . Sodium 12/22/2015 142  135 - 145 mmol/L Final  . Potassium 12/22/2015 3.9  3.5 - 5.1 mmol/L Final   Comment: DELTA CHECK NOTED REPEATED TO VERIFY NO VISIBLE HEMOLYSIS   . Chloride 12/22/2015 117* 101 - 111 mmol/L Final  . CO2 12/22/2015 20* 22 - 32 mmol/L Final  . Glucose, Bld 12/22/2015 125* 65 - 99 mg/dL Final  . BUN 12/22/2015 18  6 - 20 mg/dL Final  . Creatinine, Ser 12/22/2015 1.09* 0.44 - 1.00 mg/dL Final  . Calcium 12/22/2015 8.7* 8.9 - 10.3 mg/dL Final  . GFR calc non Af Amer 12/22/2015 44* >60 mL/min Final  . GFR calc Af Amer 12/22/2015 51* >60 mL/min Final   Comment: (NOTE) The eGFR has been calculated using the CKD EPI equation. This calculation has not been validated in all clinical situations. eGFR's persistently <60 mL/min signify possible Chronic Kidney Disease.   . Anion gap 12/22/2015 5  5 - 15 Final  . WBC 12/22/2015 5.1  4.0 - 10.5 K/uL Final  . RBC 12/22/2015 3.13* 3.87 - 5.11 MIL/uL Final  . Hemoglobin 12/22/2015 10.8* 12.0 - 15.0 g/dL Final  . HCT 12/22/2015 32.3* 36.0 - 46.0 % Final  . MCV 12/22/2015 103.2* 78.0 - 100.0 fL Final  . MCH 12/22/2015 34.5* 26.0 - 34.0 pg Final  . MCHC 12/22/2015 33.4  30.0 - 36.0 g/dL Final  . RDW 12/22/2015 14.5  11.5 - 15.5 % Final  . Platelets 12/22/2015 101* 150 - 400 K/uL Final  Ct Abdomen Pelvis Wo Contrast  Result Date: 03/06/2016 CLINICAL DATA:  83 year old female with abdominal pain. EXAM: CT  ABDOMEN AND PELVIS WITHOUT CONTRAST TECHNIQUE: Multidetector CT imaging of the abdomen and pelvis was performed following the standard protocol without IV contrast. COMPARISON:  CT dated 12/20/2015 FINDINGS: Evaluation of this exam is limited in the absence of intravenous contrast. Lower chest: Partially visualized probable trace pleural effusion versus less likely pleural thickening. There is moderate cardiomegaly. Coronary vascular calcification and calcified mitral annulus. There is hypoattenuation of the cardiac blood pool suggestive of a degree of anemia. Clinical correlation is recommended. No intra-abdominal free air.  Small free fluid within the pelvis. Hepatobiliary: Cholecystectomy. No intrahepatic biliary ductal dilatation. The liver is unremarkable. Pancreas: Unremarkable. No pancreatic ductal dilatation or surrounding inflammatory changes. Spleen: Normal in size without focal abnormality. Adrenals/Urinary Tract: The adrenal glands appear unremarkable. There is a horseshoe kidney. There is no hydronephrosis or nephrolithiasis. The visualized ureters and urinary bladder appear unremarkable. There is mild haziness of the renal pelvis bilaterally. Correlation with urinalysis recommended to exclude UTI. Stomach/Bowel: There is a 3.6 cm duodenal diverticulum. There is redundancy of the sigmoid colon. The sigmoid colon is distended with air. There is no evidence of twisting or obstruction. There is moderate stool throughout the colon extending to the junction of the descending/sigmoid junction. There is no evidence of small-bowel obstruction or active inflammation. The appendix is not visualized with certainty. No inflammatory changes identified in the right lower quadrant. Vascular/Lymphatic: There is advanced aortoiliac atherosclerotic disease. Evaluation of the vasculature is limited in the absence of intravenous contrast. No portal venous gas identified. There is no adenopathy. Reproductive: Hysterectomy.  Other: None. Musculoskeletal: There is osteopenia with extensive degenerative changes of the spine. Left hip arthroplasty. No acute fracture. IMPRESSION: Air distended redundant sigmoid colon without evidence of twisting or obstruction. There is moderate stool throughout the colon to the level of the descending/sigmoid junction. Stool is noted within the rectum. Horseshoe kidney with possible pyelonephritis. Correlation with urinalysis recommended. No hydronephrosis or nephrolithiasis. Electronically Signed   By: Anner Crete M.D.   On: 03/06/2016 03:22     Assessment/Plan   ICD-9-CM ICD-10-CM   1. Recurrent UTI 599.0 N39.0   2. Slow transit constipation 564.01 K59.01   3. Sigmoid volvulus (HCC) 560.2 K56.2    by hx. recent CT abd/pelvis did not reveal a volvulus  4. Dementia without behavioral disturbance, unspecified dementia type 294.20 F03.90    Reduce keflex 558m po BID x 10 days due to reduced CrCl  Cont other meds as ordered  Cont nutritional supplements as ordered  Will follow   S. CPerlie Gold [facility] [facility] [address] [phone] (Monday-Friday 8 AM - 5 PM) ([phone] 5 PM and follow prompts

## 2016-03-07 LAB — URINE CULTURE

## 2016-03-08 ENCOUNTER — Telehealth (HOSPITAL_COMMUNITY): Payer: Self-pay

## 2016-03-08 NOTE — Telephone Encounter (Signed)
Post ED Visit - Positive Culture Follow-up  Culture report reviewed by antimicrobial stewardship pharmacist:  []  Elenor Quinones, Pharm.D. []  Heide Guile, Pharm.D., BCPS []  Parks Neptune, Pharm.D. []  Alycia Rossetti, Pharm.D., BCPS []  [place], [place].D., BCPS, AAHIVP []  [name], Pharm.D., BCPS, AAHIVP []  Milus Glazier, Pharm.D. []  Stephens November, Pharm.D. X  Renee Ackley, Pharm.D.  Positive urine culture, 80,000 colonies -> Yeast Treated with Cephalexin Chart reviewed by K. Sofia PA-C No changes"   Dortha Kern 03/08/2016, 9:18 AM

## 2016-03-09 DIAGNOSIS — Z23 Encounter for immunization: Secondary | ICD-10-CM | POA: Diagnosis not present

## 2016-03-11 DIAGNOSIS — Z79899 Other long term (current) drug therapy: Secondary | ICD-10-CM | POA: Diagnosis not present

## 2016-03-11 LAB — BASIC METABOLIC PANEL
BUN: 18 mg/dL (ref 4–21)
CREATININE: 1.3 mg/dL — AB (ref 0.5–1.1)
GLUCOSE: 196 mg/dL
Potassium: 3.7 mmol/L (ref 3.4–5.3)
SODIUM: 141 mmol/L (ref 137–147)

## 2016-03-19 DIAGNOSIS — I517 Cardiomegaly: Secondary | ICD-10-CM | POA: Diagnosis not present

## 2016-03-19 DIAGNOSIS — R0989 Other specified symptoms and signs involving the circulatory and respiratory systems: Secondary | ICD-10-CM | POA: Diagnosis not present

## 2016-03-20 DIAGNOSIS — R69 Illness, unspecified: Secondary | ICD-10-CM | POA: Diagnosis not present

## 2016-03-21 ENCOUNTER — Non-Acute Institutional Stay (SKILLED_NURSING_FACILITY): Payer: Medicare Other | Admitting: Adult Health

## 2016-03-21 ENCOUNTER — Encounter: Payer: Self-pay | Admitting: Adult Health

## 2016-03-21 DIAGNOSIS — J0110 Acute frontal sinusitis, unspecified: Secondary | ICD-10-CM | POA: Diagnosis not present

## 2016-03-21 NOTE — Progress Notes (Signed)
Patient ID: Maria Christian, female   DOB: [birth year], 82 y.o.   MRN: [id number]   Location:   [facility] Room Number: 220-A Place of Service:  SNF (31)   CODE STATUS: DNR  Allergies  Allergen Reactions  . Aspirin Other (See Comments)    [name]    Chief Complaint  Patient presents with  . Acute Visit    Follow up chest x-ray    HPI:  She has been having a cough with congestion present. She did run a fever of 101 over the weekend. She had a chest x-ray done yesterday which was negative for pneumonia. She feels as though the congestion is in her sinuses and feels as though that is where the problem is.   Past Medical History:  Diagnosis Date  . Altered mental status   . Anemia   . Atrial fibrillation ([place])   . Constipation 02/27/2015  . Coronary artery disease   . DEMENTIA   . Depression   . Diabetes mellitus   . Edema of lower extremity 07/13/11   right leg more swollen than left leg  . Encephalopathy   . Enlarged heart   . Esophageal dysmotility 07/02/12  . GERD (gastroesophageal reflux disease)   . History of adenomatous polyp of colon 06/24/99  . Horseshoe kidney   . Hyperlipemia   . Hypertension   . Pancreatic lesion 05/22/11   no further workup  per PCP/family due to age  . Parkinson disease ([place])   . Peripheral neuropathy ([place])   . Sigmoid volvulus ([place]) 05/24/2013  . Thrombophlebitis   . TIA (transient ischemic attack) 12/19/2012    Past Surgical History:  Procedure Laterality Date  . ABDOMINAL HYSTERECTOMY    . BREAST SURGERY     2 benign tumors removed left breast  . CHOLECYSTECTOMY    . ESOPHAGEAL DILATION     several times by Dr. [name]  . ESOPHAGOGASTRODUODENOSCOPY (EGD) WITH ESOPHAGEAL DILATION N/A 08/01/2012   Procedure: ESOPHAGOGASTRODUODENOSCOPY (EGD) WITH ESOPHAGEAL DILATION;  Surgeon: [name], MD;  Location: [facility];  Service: Endoscopy;  Laterality: N/A;  with c-arm savory dilators  .  ESOPHAGOGASTRODUODENOSCOPY (EGD) WITH PROPOFOL N/A 03/12/2015   Procedure: ESOPHAGOGASTRODUODENOSCOPY (EGD) WITH PROPOFOL;  Surgeon: [name], MD;  Location: [facility];  Service: Endoscopy;  Laterality: N/A;  . FLEXIBLE SIGMOIDOSCOPY N/A 05/24/2013   Procedure: FLEXIBLE SIGMOIDOSCOPY;  Surgeon: [name], MD;  Location: [facility];  Service: Endoscopy;  Laterality: N/A;  . FLEXIBLE SIGMOIDOSCOPY N/A 05/26/2013   Procedure:  flex with decompression of sigmoid volvulus;  Surgeon: [name], MD;  Location: [facility];  Service: Endoscopy;  Laterality: N/A;  . FLEXIBLE SIGMOIDOSCOPY N/A 12/20/2015   Procedure: FLEXIBLE SIGMOIDOSCOPY;  Surgeon: Milus Banister, MD;  Location: [facility];  Service: Endoscopy;  Laterality: N/A;  . FOOT SURGERY     benign tumors from foot  . NOSE SURGERY    . SHOULDER SURGERY  2001   left clavicle excision and acromioplasty  . TOTAL HIP ARTHROPLASTY      Social History   Social History  . Marital status: Widowed    Spouse name: N/A  . Number of children: 5  . Years of education: 10th   Occupational History  . Retired    Social History Main Topics  . Smoking status: Former Smoker    Quit date: 10/09/1964  . Smokeless tobacco: Never Used  . Alcohol use No  . Drug use: No  .  Sexual activity: No   Other Topics Concern  . Not on file   Social History Narrative   Pt lives at [facility] and [place].   Caffeine Use: very small amount daily   Family History  Problem Relation Age of Onset  . Cancer    . Heart disease        VITAL SIGNS BP 101/67   Pulse 64   Temp (!) 96.7 F (35.9 C) (Oral)   Resp 18   Ht 5\' 7"  (1.702 m)   Wt 151 lb 6 oz (68.7 kg)   SpO2 96%   BMI 23.71 kg/m   Patient's Medications  New Prescriptions   No medications on file  Previous Medications   ACETAMINOPHEN (TYLENOL) 325 MG TABLET    Take 2 tablets (650 mg total) by mouth every 6 (six) hours as needed for mild pain.   AMLODIPINE  (NORVASC) 2.5 MG TABLET    Take 2.5 mg by mouth every morning. Hold for BP < 100/60 or HR < 60   ASPIRIN EC 81 MG TABLET    Take 81 mg by mouth daily.   BACLOFEN (LIORESAL) 10 MG TABLET    Take 5 mg by mouth at bedtime.   BIOTIN 5 MG/ML LIQD    Take 5 mLs by mouth 2 (two) times daily.    CARVEDILOL (COREG) 3.125 MG TABLET    Take 3.125 mg by mouth daily.   CHOLECALCIFEROL (VITAMIN D) 1000 UNITS TABLET    Take 1,000 Units by mouth every morning.    CRANBERRY 475 MG CAPS    Take 1 capsule by mouth 2 (two) times daily.    DOCUSATE SODIUM (COLACE) 100 MG CAPSULE    Take 200 mg by mouth at bedtime.    DORZOLAMIDE (TRUSOPT) 2 % OPHTHALMIC SOLUTION    Place 1 drop into both eyes 3 (three) times daily. Reported on 11/16/2015        0800,1400,2000   ESCITALOPRAM (LEXAPRO) 10 MG TABLET    Take 1 tablet (10 mg total) by mouth daily.   FERROUS SULFATE 325 (65 FE) MG TABLET    Take 325 mg by mouth daily with breakfast.   FLUCONAZOLE (DIFLUCAN) 100 MG TABLET    Take 100 mg by mouth daily.   FLUTICASONE (FLONASE) 50 MCG/ACT NASAL SPRAY    Place 2 sprays into both nostrils at bedtime.   GABAPENTIN (NEURONTIN) 100 MG CAPSULE    Take 200 mg by mouth at bedtime.    LIDOCAINE (LIDODERM) 5 %    Place 1 patch onto the skin daily. Apply 1 patch to left lower back/hip every morning.  Remove & Discard patch within 12 hours or as directed by MD   LINACLOTIDE (LINZESS) 145 MCG CAPS CAPSULE    Take 145 mcg by mouth daily before breakfast.    LORATADINE (CLARITIN) 10 MG TABLET    Take 10 mg by mouth daily before breakfast.    MULTIPLE VITAMINS-MINERALS (CERTAGEN PO)    Take 1 tablet by mouth daily.    ONDANSETRON (ZOFRAN) 8 MG TABLET    Take 8 mg by mouth every 8 (eight) hours as needed for nausea or vomiting.   OXYGEN    Inhale 2 L into the lungs continuous.   POLYETHYLENE GLYCOL (MIRALAX / GLYCOLAX) PACKET    Take 17 g by mouth daily at 6 (six) AM.    POTASSIUM CHLORIDE SA (K-DUR,KLOR-CON) 20 MEQ TABLET    Take 20 mEq by  mouth daily.  PROTONIX 40 MG PACK    Take 40 mg by mouth daily at 6 (six) AM.   SACCHAROMYCES BOULARDII (FLORASTOR) 250 MG CAPSULE    Take 250 mg by mouth 2 (two) times daily.   SENNA (SENOKOT) 8.6 MG TABS TABLET    Take 1 tablet (8.6 mg total) by mouth 2 (two) times daily.   SODIUM CHLORIDE (OCEAN) 0.65 % SOLN NASAL SPRAY    Place 1 spray into both nostrils as directed. Four times daily And every 24 hours as needed to moisturize nasal passages.   TORSEMIDE (DEMADEX) 20 MG TABLET    Take 20 mg by mouth daily.   TRAVOPROST, BAK FREE, (TRAVATAN) 0.004 % SOLN OPHTHALMIC SOLUTION    Place 1 drop into both eyes daily at 8 pm.    VITAMIN B-12 (CYANOCOBALAMIN) 500 MCG TABLET    Take 500 mcg by mouth every morning.   Modified Medications   No medications on file  Discontinued Medications   CEPHALEXIN (KEFLEX) 500 MG CAPSULE    Take 1 capsule (500 mg total) by mouth 4 (four) times daily.     SIGNIFICANT DIAGNOSTIC EXAMS  02-15-15: abdominal ultrasound: 1. Gallbladder not visualized. 2. No hydronephrosis no renal calculus; 3. The pancreas not well visualized; which could be related to gassy abdomen but an appearance which might represent pancreatitis in the appropriate clinical setting.   03-12-15: upper endoscopy: duodenal bulb 1.5-2.0 cm ulcer with clot at base. No active bleeding. Surrounding mucosa was edematous. Biopsy of the surrounding mucosa were done.   12-20-15: kub: 1. Markedly distended loops of colon without definitive gas in the rectum. Given the similar pattern on previous studies and history, this likely represents chronic colonic ileus. It would be very difficult to exclude a volvulus on this study alone and if there is concern, a CT scan could better evaluate.  12-20-15: ct of abdomen and pelvis: 1. Loosely twisted sigmoid volvulus with transition point. Proximal distention is improved compared to radiography earlier today suggesting partial interval decompression. 2. Chronic findings  are stable and described above.  12-20-15: flex sigmoidoscopy: chronic intermittent sigmoid volvulus. The site of volvulus was only slightly twisted during this exam, easily passed with colonoscope and the air filled dilated colon proximal to it was suctioned extensively. Her abdomen was flat, normal after the exam.   02-19-16: right lower extremity doppler: neg dvt  02-23-16: renal ultrasound: normal right kidney slightly small left kidney. Right 1.9 cm cyst no hydronephrosis or stones   03-19-16: chest x-ray: modest cardiomegaly with mild pulmonary venous congestion: received demadex 20 mg one time .   LABS REVIEWED:      03-09-15: wbc 5.8; hgb 10.2; hct 30.3; mcv 103.1; plt 221; glucose 137; bun 14; creat 1.07; k+ 3.2; na++139; liver normal albumin 3.7; lipase 24; urine culture: e-coli: cipro 03-12-15: wbc 4.5 hgb 7.6; hct 23.0; mcv 101.3; plt 168; glucose 138; bun 40; creat 1.24; k+ 3.0; na++141; liver normal albumin 2.8 03-15-15: wbc 5.4; hgb 8.1; hct 25.1; mcv 105.5; plt 171; glucose 144; bun 13; creat 0.96; k+ 3.2; na++141  03-18-15: wbc 5.3; hgb 7.7; hct 24.9; mcv 105; plt 188; glucose 149; bun 8.1; creat 1.12; k+ 3.6; na++143  04-30-15: H-pylori: neg  10-14-15: wbc 5.0; hgb 11.5; hct 33.9; mcv 101.2; plt 127; glucose 132; bun 15.8; creat 1.27; k+ 3.8; na++ 143; liver normal albumin 4.0  12-20-15: wbc 5.2; hgb 11.0; hct 32.4; mcv 101.3; plt 107; glucose 138; bun 31; creat 1.61; k+ 3.2; na++ 138;  liver normal albumin 3.9; urine culture: e-coli: rocephin 12-21-15: wbc 3.9; hgb 9.8; hct 29.7; mcv 103.1; plt 98; glucose 108; bun 23; creat 1.15; k+ 3.2; na++ 143; mag 2.4 12-22-15: wbc 5.1; hgb 10.8; hct 32.3; mcv 103.2; plt 101; glucose 125; bun 18; creat 1.09; k+ 3.9; na++ 142  01-04-16: wbc 4.1; hgb 10.9; hct 32.4; mcv 100.5; plt 175; glucose 121; bun 13.2; creat 1.18; k+ 3.8; na++ 144; liver normal albumin 3.8 01-31-16; urine culture: e-coli: ESBL invanz 02-15-16: wbc 4.6; hgb 11.3; hct 36.3; mcv  102.7; plt 128; glucose 160; bun 15.3; creat 1.65; k+ 4.5; na++ 143; liver normal albumin 3.9 02-19-16: glucose 116; bun 28.7; mcv 1.72; k+ 4.5; na++ 143 02-23-16; wbc 3.5 hgb 9.7; hct 30.4; mcv 101.8; plt 126; glucose 118; bun 25.6; creat 1.52; k+ 4.6; na++ 144  03-11-16: glucose 196; bun 18.4; creat 1.28; k+ 3.7; na++ 141 03-20-16: A/B influenza:neg    Review of Systems  Constitutional: Negative for appetite change and fatigue.  HENT: has sinus congestion   Respiratory: Negative for chest tightness and shortness of breath.  has cough Cardiovascular: Negative for chest pain, palpitations and leg swelling.  Gastrointestinal: Negative for nausea, abdominal pain, diarrhea and constipation.  Musculoskeletal: no complaint of muscle or joint pain    Skin: Negative for pallor.  Neurological: Negative for dizziness.  Psychiatric/Behavioral: The patient is not nervous/anxious.       Physical Exam  Constitutional: No distress.  Eyes: Conjunctivae are normal.  Neck: Neck supple. No JVD present. No thyromegaly present.  Cardiovascular: Normal rate, regular rhythm and intact distal pulses.   Respiratory: Effort normal and breath sounds normal. No respiratory distress. She has no wheezes.  Is 02 dependent  GI: Soft. Bowel sounds are normal.  no tenderness Musculoskeletal: She exhibits no edema.  Able to move all extremities .   Lymphadenopathy:    She has no cervical adenopathy.  Neurological: She is alert.  Skin: Skin is warm and dry. She is not diaphoretic.  Psychiatric: She has a normal mood and affect.     ASSESSMENT/ PLAN:  1. Acute sinusitis: will begin a z-pack; will begin mucinex 600 mg nightly for one week.       Ok Edwards NP [facility]  Contact [phone] Monday through Friday 8am- 5pm  After hours call [phone]

## 2016-03-27 ENCOUNTER — Encounter: Payer: Self-pay | Admitting: Internal Medicine

## 2016-03-27 ENCOUNTER — Non-Acute Institutional Stay (SKILLED_NURSING_FACILITY): Payer: Medicare Other | Admitting: Internal Medicine

## 2016-03-27 DIAGNOSIS — F0390 Unspecified dementia without behavioral disturbance: Secondary | ICD-10-CM | POA: Diagnosis not present

## 2016-03-27 DIAGNOSIS — J9611 Chronic respiratory failure with hypoxia: Secondary | ICD-10-CM

## 2016-03-27 DIAGNOSIS — I5032 Chronic diastolic (congestive) heart failure: Secondary | ICD-10-CM | POA: Diagnosis not present

## 2016-03-27 DIAGNOSIS — R062 Wheezing: Secondary | ICD-10-CM | POA: Diagnosis not present

## 2016-03-27 DIAGNOSIS — K5901 Slow transit constipation: Secondary | ICD-10-CM | POA: Diagnosis not present

## 2016-03-27 DIAGNOSIS — I482 Chronic atrial fibrillation, unspecified: Secondary | ICD-10-CM

## 2016-03-27 DIAGNOSIS — M150 Primary generalized (osteo)arthritis: Secondary | ICD-10-CM

## 2016-03-27 DIAGNOSIS — K219 Gastro-esophageal reflux disease without esophagitis: Secondary | ICD-10-CM | POA: Diagnosis not present

## 2016-03-27 DIAGNOSIS — M159 Polyosteoarthritis, unspecified: Secondary | ICD-10-CM

## 2016-03-27 NOTE — Progress Notes (Signed)
Patient ID: Maria Christian, female   DOB: 02/01/1925, 82 y.o.   MRN: [id number]    DATE:  03/27/2016  Location:    [facility] Room Number: 937 A Place of Service: SNF (31)   Extended Emergency Contact Information Primary Emergency Contact: [name] [place] Phone: [phone] Relation: Son Secondary Emergency Contact: Roberts,Lynn Address: [address]          [place], [place] [address] [place] Phone: [phone] Mobile Phone: [phone] Relation: Daughter  Advanced Directive information Does patient have an advance directive?: Yes, Type of Advance Directive: Out of facility DNR (pink MOST or yellow form), Does patient want to make changes to advanced directive?: No - Patient declined  Chief Complaint  Patient presents with  . Medical Management of Chronic Issues    Routine visit    HPI:  82 yo female long term resident seen today for f/u. She c/o chest congestion x several days. She just completed zpak for URI. She continues to have productive cough with green sputum. No f/c. No new SOB. CXR showed mild pulmonary congestion but no pneumonia. She is c/a soreness of buttocks and states topical salve not working. No other concerns. She is a poor historian due to dementia. Hx obtained from chart  Hypertension - BP stable on norvasc 2.5 mg daily; coreg 3.125 mg daily   Hx Chronic diastolic heart failure - stable on torsemide. Recent CXR revealed mild pulmonary edema  Hx Duodenal ulcer - stable on protonix 40 mg  daily    Recurrent UTI - takes cranberry tabs daily for prevention.     Chronic pain due to osteoarthritis - stable on lidoderm to lower back daily;  baclofen 5 mg nightly for leg spasticity  Chronic constipation - chronic sigmoid volvulus. Takes colace 200 mg daily; linzess 145 mcg daily; senna twice daily; miralax daily   CKD - Cr 1.3 s/o indicative of stage 3 disease    Anemia of chronic disease -  stable on iron daily. Hgb 11.5  Chronic afib - rate controlled on coreg 3.125 mg daily. She takes ASA daily for anticoagulaiton.    Chronic respiratory failure - O2 dependent. stable   Allergic rhinitis - stable on flonase nightly and claritin 10 mg daily  Chronic pain due to osteoarthritis - stable on lidoderm to lower back daily; baclofen 5 mg nightly for leg spasticity    Depression - mood stable on lexapro 10 mg daily    Glaucoma - uses trusopt to both eyes three times daily; travatan to both eyes nightly   Low back pain with radiculopathy - currently on neurontin 100 mg nightly for neuropathy   Dementia - weight has improved by 5 lbs since last month. Appetite somewhat poor. Last albumin 3.3. She gets 2cal nutritional supplements. She takes vitamins and minerals. She does not have any medicine for cognitive impairment at this time    Past Medical History:  Diagnosis Date  . Altered mental status   . Anemia   . Atrial fibrillation ([place])   . Constipation 02/27/2015  . Coronary artery disease   . DEMENTIA   . Depression   . Diabetes mellitus   . Edema of lower extremity 07/13/11   right leg more swollen than left leg  . Encephalopathy   . Enlarged heart   . Esophageal dysmotility 07/02/12  . GERD (gastroesophageal reflux disease)   . History of adenomatous polyp of colon 06/24/99  . Horseshoe kidney   .  Hyperlipemia   . Hypertension   . Pancreatic lesion 05/22/11   no further workup  per PCP/family due to age  . Parkinson disease ([place])   . Peripheral neuropathy ([place])   . Sigmoid volvulus ([place]) 05/24/2013  . Thrombophlebitis   . TIA (transient ischemic attack) 12/19/2012    Past Surgical History:  Procedure Laterality Date  . ABDOMINAL HYSTERECTOMY    . BREAST SURGERY     2 benign tumors removed left breast  . CHOLECYSTECTOMY    . ESOPHAGEAL DILATION     several times by Dr. [name]  . ESOPHAGOGASTRODUODENOSCOPY (EGD) WITH ESOPHAGEAL DILATION N/A 08/01/2012    Procedure: ESOPHAGOGASTRODUODENOSCOPY (EGD) WITH ESOPHAGEAL DILATION;  Surgeon: [name], MD;  Location: [facility];  Service: Endoscopy;  Laterality: N/A;  with c-arm savory dilators  . ESOPHAGOGASTRODUODENOSCOPY (EGD) WITH PROPOFOL N/A 03/12/2015   Procedure: ESOPHAGOGASTRODUODENOSCOPY (EGD) WITH PROPOFOL;  Surgeon: [name], MD;  Location: [facility];  Service: Endoscopy;  Laterality: N/A;  . FLEXIBLE SIGMOIDOSCOPY N/A 05/24/2013   Procedure: FLEXIBLE SIGMOIDOSCOPY;  Surgeon: [name], MD;  Location: [facility];  Service: Endoscopy;  Laterality: N/A;  . FLEXIBLE SIGMOIDOSCOPY N/A 05/26/2013   Procedure:  flex with decompression of sigmoid volvulus;  Surgeon: [name], MD;  Location: [facility];  Service: Endoscopy;  Laterality: N/A;  . FLEXIBLE SIGMOIDOSCOPY N/A 12/20/2015   Procedure: FLEXIBLE SIGMOIDOSCOPY;  Surgeon: Milus Banister, MD;  Location: [facility];  Service: Endoscopy;  Laterality: N/A;  . FOOT SURGERY     benign tumors from foot  . NOSE SURGERY    . SHOULDER SURGERY  2001   left clavicle excision and acromioplasty  . TOTAL HIP ARTHROPLASTY      Patient Care Team: [name], DO as PCP - General (Internal Medicine) Gerlene Fee, NP as Nurse Practitioner (Nurse Practitioner)  Social History   Social History  . Marital status: Widowed    Spouse name: N/A  . Number of children: 5  . Years of education: 10th   Occupational History  . Retired    Social History Main Topics  . Smoking status: Former Smoker    Quit date: 10/09/1964  . Smokeless tobacco: Never Used  . Alcohol use No  . Drug use: No  . Sexual activity: No   Other Topics Concern  . Not on file   Social History Narrative   Pt [facility] at [facility] [place].   Caffeine Use: very small amount daily     reports that she quit smoking about 51 years ago. She has never used smokeless tobacco. She reports that she does not drink alcohol or use drugs.  Family  History  Problem Relation Age of Onset  . Cancer    . Heart disease     Family Status  Relation Status  . Mother Deceased at age 89   blood clots  . Father Deceased at age 2   cirrohis of the liver  .      Immunization History  Administered Date(s) Administered  . Influenza-Unspecified 07/13/2015, 03/09/2016  . PPD Test 07/28/2013, 02/18/2016, 02/25/2016    Allergies  Allergen Reactions  . Aspirin Other (See Comments)    G.I. Upset only    Medications: Patient's Medications  New Prescriptions   No medications on file  Previous Medications   ACETAMINOPHEN (TYLENOL) 325 MG TABLET    Take 2 tablets (650 mg total) by mouth every 6 (six) hours as needed for mild pain.  AMLODIPINE (NORVASC) 2.5 MG TABLET    Take 2.5 mg by mouth every morning. Hold for BP < 100/60 or HR < 60   ASPIRIN EC 81 MG TABLET    Take 81 mg by mouth daily.   BACLOFEN (LIORESAL) 10 MG TABLET    Take 5 mg by mouth at bedtime.   BENZOCAINE-MENTHOL (CHLORASEPTIC) 6-10 MG LOZENGE    Take 1 lozenge by mouth as needed for sore throat.   BIOTIN 5 MG/ML LIQD    Take 5 mLs by mouth 2 (two) times daily.    CARVEDILOL (COREG) 3.125 MG TABLET    Take 3.125 mg by mouth daily.   CHOLECALCIFEROL (VITAMIN D) 1000 UNITS TABLET    Take 1,000 Units by mouth every morning.    CRANBERRY 475 MG CAPS    Take 1 capsule by mouth 2 (two) times daily.    DOCUSATE SODIUM (COLACE) 100 MG CAPSULE    Take 200 mg by mouth at bedtime.    DORZOLAMIDE (TRUSOPT) 2 % OPHTHALMIC SOLUTION    Place 1 drop into both eyes 3 (three) times daily. Reported on 11/16/2015        0800,1400,2000   ESCITALOPRAM (LEXAPRO) 10 MG TABLET    Take 1 tablet (10 mg total) by mouth daily.   FERROUS SULFATE 325 (65 FE) MG TABLET    Take 325 mg by mouth daily with breakfast.   FLUTICASONE (FLONASE) 50 MCG/ACT NASAL SPRAY    Place 2 sprays into both nostrils at bedtime.   GABAPENTIN (NEURONTIN) 100 MG CAPSULE    Take 200 mg by mouth at bedtime.    GUAIFENESIN  (MUCINEX) 600 MG 12 HR TABLET    Take 600 mg by mouth at bedtime.   LIDOCAINE (LIDODERM) 5 %    Place 1 patch onto the skin daily. Apply 1 patch to left lower back/hip every morning.  Remove & Discard patch within 12 hours or as directed by MD   LINACLOTIDE (LINZESS) 145 MCG CAPS CAPSULE    Take 145 mcg by mouth daily before breakfast.    LORATADINE (CLARITIN) 10 MG TABLET    Take 10 mg by mouth daily before breakfast.    MULTIPLE VITAMINS-MINERALS (CERTAGEN PO)    Take 1 tablet by mouth daily.    ONDANSETRON (ZOFRAN) 8 MG TABLET    Take 8 mg by mouth every 8 (eight) hours as needed for nausea or vomiting.   OXYGEN    Inhale 2 L into the lungs continuous.   POLYETHYLENE GLYCOL (MIRALAX / GLYCOLAX) PACKET    Take 17 g by mouth daily at 6 (six) AM.    POTASSIUM CHLORIDE SA (K-DUR,KLOR-CON) 20 MEQ TABLET    Take 20 mEq by mouth daily.    PROTONIX 40 MG PACK    Take 40 mg by mouth daily at 6 (six) AM.   SACCHAROMYCES BOULARDII (FLORASTOR) 250 MG CAPSULE    Take 250 mg by mouth 2 (two) times daily.   SENNA (SENOKOT) 8.6 MG TABS TABLET    Take 1 tablet (8.6 mg total) by mouth 2 (two) times daily.   SODIUM CHLORIDE (OCEAN) 0.65 % SOLN NASAL SPRAY    Place 1 spray into both nostrils as directed. Four times daily And every 24 hours as needed to moisturize nasal passages.   TORSEMIDE (DEMADEX) 20 MG TABLET    Take 20 mg by mouth daily.   TRAVOPROST, BAK FREE, (TRAVATAN) 0.004 % SOLN OPHTHALMIC SOLUTION    Place 1 drop into both  eyes daily at 8 pm.    VITAMIN B-12 (CYANOCOBALAMIN) 500 MCG TABLET    Take 500 mcg by mouth every morning.   Modified Medications   No medications on file  Discontinued Medications   No medications on file    Review of Systems  Unable to perform ROS: Dementia    Vitals:   03/27/16 1231  BP: 118/74  Pulse: 69  Resp: 16  Temp: 98.7 F (37.1 C)  TempSrc: Oral  SpO2: 94%  Weight: 148 lb 12.8 oz (67.5 kg)  Height: 5' 7"  (1.702 m)   Body mass index is 23.31  kg/m.  Physical Exam  Constitutional: She appears well-developed.  Frail appearing, lying in bed in NAD  HENT:  Mouth/Throat: Oropharynx is clear and moist. No oropharyngeal exudate.  MMM  Eyes: Pupils are equal, round, and reactive to light. No scleral icterus.  Neck: Neck supple. Carotid bruit is not present. No tracheal deviation present.  Cardiovascular: Normal rate, regular rhythm and intact distal pulses.   Occasional extrasystoles are present. Exam reveals no gallop and no friction rub.   Murmur (1/6 SEM) heard. No LE edema b/l. No calf TTP  Pulmonary/Chest: Effort normal. No stridor. No respiratory distress. She has wheezes (inspiratory). She has no rales.  Abdominal: Soft. Bowel sounds are normal. She exhibits distension. She exhibits no mass. There is no hepatomegaly. There is no tenderness. There is no rebound and no guarding.  (+) tympany to percussion  Musculoskeletal: She exhibits edema.  Lymphadenopathy:    She has no cervical adenopathy.  Neurological: She is alert.  Skin: Skin is warm and dry. No rash noted.  Psychiatric: She has a normal mood and affect. Her behavior is normal.     Labs reviewed: Nursing Home on 03/21/2016  Component Date Value Ref Range Status  . Glucose 03/11/2016 196  mg/dL Final  . BUN 03/11/2016 18  4 - 21 mg/dL Final  . Creatinine 03/11/2016 1.3* 0.5 - 1.1 mg/dL Final  . Potassium 03/11/2016 3.7  3.4 - 5.3 mmol/L Final  . Sodium 03/11/2016 141  137 - 147 mmol/L Final  Admission on 03/05/2016, Discharged on 03/06/2016  Component Date Value Ref Range Status  . Sodium 03/06/2016 143  135 - 145 mmol/L Final  . Potassium 03/06/2016 4.2  3.5 - 5.1 mmol/L Final  . Chloride 03/06/2016 115* 101 - 111 mmol/L Final  . CO2 03/06/2016 22  22 - 32 mmol/L Final  . Glucose, Bld 03/06/2016 120* 65 - 99 mg/dL Final  . BUN 03/06/2016 23* 6 - 20 mg/dL Final  . Creatinine, Ser 03/06/2016 1.40* 0.44 - 1.00 mg/dL Final  . Calcium 03/06/2016 8.6* 8.9 - 10.3  mg/dL Final  . Total Protein 03/06/2016 6.6  6.5 - 8.1 g/dL Final  . Albumin 03/06/2016 3.3* 3.5 - 5.0 g/dL Final  . AST 03/06/2016 29  15 - 41 U/L Final  . ALT 03/06/2016 15  14 - 54 U/L Final  . Alkaline Phosphatase 03/06/2016 82  38 - 126 U/L Final  . Total Bilirubin 03/06/2016 0.8  0.3 - 1.2 mg/dL Final  . GFR calc non Af Amer 03/06/2016 32* >60 mL/min Final  . GFR calc Af Amer 03/06/2016 37* >60 mL/min Final   Comment: (NOTE) The eGFR has been calculated using the CKD EPI equation. This calculation has not been validated in all clinical situations. eGFR's persistently <60 mL/min signify possible Chronic Kidney Disease.   . Anion gap 03/06/2016 6  5 - 15 Final  .  WBC 03/06/2016 3.8* 4.0 - 10.5 K/uL Final  . RBC 03/06/2016 2.94* 3.87 - 5.11 MIL/uL Final  . Hemoglobin 03/06/2016 9.8* 12.0 - 15.0 g/dL Final  . HCT 03/06/2016 29.5* 36.0 - 46.0 % Final  . MCV 03/06/2016 100.3* 78.0 - 100.0 fL Final  . MCH 03/06/2016 33.3  26.0 - 34.0 pg Final  . MCHC 03/06/2016 33.2  30.0 - 36.0 g/dL Final  . RDW 03/06/2016 14.5  11.5 - 15.5 % Final  . Platelets 03/06/2016 106* 150 - 400 K/uL Final   Comment: SPECIMEN CHECKED FOR CLOTS REPEATED TO VERIFY PLATELET COUNT CONFIRMED BY SMEAR   . Neutrophils Relative % 03/06/2016 45  % Final  . Neutro Abs 03/06/2016 1.7  1.7 - 7.7 K/uL Final  . Lymphocytes Relative 03/06/2016 40  % Final  . Lymphs Abs 03/06/2016 1.5  0.7 - 4.0 K/uL Final  . Monocytes Relative 03/06/2016 10  % Final  . Monocytes Absolute 03/06/2016 0.4  0.1 - 1.0 K/uL Final  . Eosinophils Relative 03/06/2016 5  % Final  . Eosinophils Absolute 03/06/2016 0.2  0.0 - 0.7 K/uL Final  . Basophils Relative 03/06/2016 0  % Final  . Basophils Absolute 03/06/2016 0.0  0.0 - 0.1 K/uL Final  . Lactic Acid, Venous 03/06/2016 1.01  0.5 - 1.9 mmol/L Final  . Troponin i, poc 03/06/2016 0.03  0.00 - 0.08 ng/mL Final  . Comment 3 03/06/2016          Final   Comment: Due to the release kinetics of  cTnI, a negative result within the first hours of the onset of symptoms does not rule out myocardial infarction with certainty. If myocardial infarction is still suspected, repeat the test at appropriate intervals.   . Color, Urine 03/06/2016 YELLOW  YELLOW Final  . APPearance 03/06/2016 TURBID* CLEAR Final  . Specific Gravity, Urine 03/06/2016 1.013  1.005 - 1.030 Final  . pH 03/06/2016 5.5  5.0 - 8.0 Final  . Glucose, UA 03/06/2016 NEGATIVE  NEGATIVE mg/dL Final  . Hgb urine dipstick 03/06/2016 MODERATE* NEGATIVE Final  . Bilirubin Urine 03/06/2016 NEGATIVE  NEGATIVE Final  . Ketones, ur 03/06/2016 NEGATIVE  NEGATIVE mg/dL Final  . Protein, ur 03/06/2016 30* NEGATIVE mg/dL Final  . Nitrite 03/06/2016 NEGATIVE  NEGATIVE Final  . Leukocytes, UA 03/06/2016 LARGE* NEGATIVE Final  . Specimen Description 03/07/2016 URINE, CATHETERIZED   Final  . Special Requests 03/07/2016 NONE   Final  . Culture 03/07/2016 80,000 COLONIES/mL YEAST*  Final  . Report Status 03/07/2016 03/07/2016 FINAL   Final  . Squamous Epithelial / LPF 03/06/2016 0-5* NONE SEEN Final  . WBC, UA 03/06/2016 TOO NUMEROUS TO COUNT  0 - 5 WBC/hpf Final  . RBC / HPF 03/06/2016 0-5  0 - 5 RBC/hpf Final  . Bacteria, UA 03/06/2016 MANY* NONE SEEN Final  Nursing Home on 03/01/2016  Component Date Value Ref Range Status  . Hemoglobin 02/23/2016 9.7* 12.0 - 16.0 g/dL Final  . HCT 02/23/2016 31* 36 - 46 % Final  . Platelets 02/23/2016 126* 150 - 399 K/L Final  . WBC 02/23/2016 3.5  10^3/mL Final  . Glucose 02/23/2016 118  mg/dL Final  . BUN 02/23/2016 26* 4 - 21 mg/dL Final  . Creatinine 02/23/2016 1.5* 0.5 - 1.1 mg/dL Final  . Potassium 02/23/2016 4.6  3.4 - 5.3 mmol/L Final  . Sodium 02/23/2016 144  137 - 147 mmol/L Final  Nursing Home on 02/22/2016  Component Date Value Ref Range Status  . Hemoglobin  02/15/2016 11.3* 12.0 - 16.0 g/dL Final  . HCT 02/15/2016 36  36 - 46 % Final  . Platelets 02/15/2016 128* 150 - 399 K/L  Final  . WBC 02/15/2016 4.6  10^3/mL Final  . Glucose 02/15/2016 160  mg/dL Final  . BUN 02/15/2016 19  4 - 21 mg/dL Final  . Creatinine 02/15/2016 1.5* 0.5 - 1.1 mg/dL Final  . Potassium 02/15/2016 4.5  3.4 - 5.3 mmol/L Final  . Sodium 02/15/2016 143  137 - 147 mmol/L Final  . Alkaline Phosphatase 02/15/2016 107  25 - 125 U/L Final  . ALT 02/15/2016 11  7 - 35 U/L Final  . AST 02/15/2016 20  13 - 35 U/L Final  . Bilirubin, Total 02/15/2016 0.4  mg/dL Final  Erroneous Encounter on 01/05/2016  Component Date Value Ref Range Status  . Hemoglobin 01/04/2016 10.9* 12.0 - 16.0 g/dL Final  . HCT 01/04/2016 32* 36 - 46 % Final  . Platelets 01/04/2016 175  150 - 399 K/L Final  . WBC 01/04/2016 4.1  10^3/mL Final  . Glucose 01/04/2016 121  mg/dL Final  . BUN 01/04/2016 13  4 - 21 mg/dL Final  . Creatinine 01/04/2016 1.2* 0.5 - 1.1 mg/dL Final  . Potassium 01/04/2016 3.8  3.4 - 5.3 mmol/L Final  . Sodium 01/04/2016 144  137 - 147 mmol/L Final  . Alkaline Phosphatase 01/04/2016 91  25 - 125 U/L Final  . ALT 01/04/2016 9  7 - 35 U/L Final  . AST 01/04/2016 15  13 - 35 U/L Final  . Bilirubin, Total 01/04/2016 0.3  mg/dL Final  Nursing Home on 12/27/2015  Component Date Value Ref Range Status  . Hemoglobin 12/21/2015 9.8* 12.0 - 16.0 g/dL Final  . HCT 12/21/2015 30* 36 - 46 % Final  . Platelets 12/21/2015 98* 150 - 399 K/L Final  . WBC 12/21/2015 3.9  10^3/mL Final  . WBC 12/22/2015 5.1  10^3/mL Final  . Glucose 12/22/2015 125  mg/dL Final  . BUN 12/22/2015 18  4 - 21 mg/dL Final  . Creatinine 12/22/2015 1.1  0.5 - 1.1 mg/dL Final  . Sodium 12/22/2015 142  137 - 147 mmol/L Final  . Bilirubin, Total 12/22/2015 0.8  mg/dL Final    Ct Abdomen Pelvis Wo Contrast  Result Date: 03/06/2016 CLINICAL DATA:  82 year old female with abdominal pain. EXAM: CT ABDOMEN AND PELVIS WITHOUT CONTRAST TECHNIQUE: Multidetector CT imaging of the abdomen and pelvis was performed following the standard  protocol without IV contrast. COMPARISON:  CT dated 12/20/2015 FINDINGS: Evaluation of this exam is limited in the absence of intravenous contrast. Lower chest: Partially visualized probable trace pleural effusion versus less likely pleural thickening. There is moderate cardiomegaly. Coronary vascular calcification and calcified mitral annulus. There is hypoattenuation of the cardiac blood pool suggestive of a degree of anemia. Clinical correlation is recommended. No intra-abdominal free air.  Small free fluid within the pelvis. Hepatobiliary: Cholecystectomy. No intrahepatic biliary ductal dilatation. The liver is unremarkable. Pancreas: Unremarkable. No pancreatic ductal dilatation or surrounding inflammatory changes. Spleen: Normal in size without focal abnormality. Adrenals/Urinary Tract: The adrenal glands appear unremarkable. There is a horseshoe kidney. There is no hydronephrosis or nephrolithiasis. The visualized ureters and urinary bladder appear unremarkable. There is mild haziness of the renal pelvis bilaterally. Correlation with urinalysis recommended to exclude UTI. Stomach/Bowel: There is a 3.6 cm duodenal diverticulum. There is redundancy of the sigmoid colon. The sigmoid colon is distended with air. There is no evidence of twisting or  obstruction. There is moderate stool throughout the colon extending to the junction of the descending/sigmoid junction. There is no evidence of small-bowel obstruction or active inflammation. The appendix is not visualized with certainty. No inflammatory changes identified in the right lower quadrant. Vascular/Lymphatic: There is advanced aortoiliac atherosclerotic disease. Evaluation of the vasculature is limited in the absence of intravenous contrast. No portal venous gas identified. There is no adenopathy. Reproductive: Hysterectomy. Other: None. Musculoskeletal: There is osteopenia with extensive degenerative changes of the spine. Left hip arthroplasty. No acute  fracture. IMPRESSION: Air distended redundant sigmoid colon without evidence of twisting or obstruction. There is moderate stool throughout the colon to the level of the descending/sigmoid junction. Stool is noted within the rectum. Horseshoe kidney with possible pyelonephritis. Correlation with urinalysis recommended. No hydronephrosis or nephrolithiasis. Electronically Signed   By: Anner Crete M.D.   On: 03/06/2016 03:22     Assessment/Plan   ICD-9-CM ICD-10-CM   1. Wheezing 786.07 R06.2   2. Diastolic CHF, chronic (HCC) 428.32 I50.32    428.0    3. Slow transit constipation 564.01 K59.01   4. Chronic atrial fibrillation (HCC) 427.31 I48.2   5. Chronic respiratory failure with hypoxia (HCC) 518.83 J96.11    799.02    6. Gastroesophageal reflux disease without esophagitis 530.81 K21.9   7. Primary osteoarthritis involving multiple joints 715.09 M15.0   8. Dementia without behavioral disturbance, unspecified dementia type 294.20 F03.90      Start duoneb TID x 1 week-->BID x 1 wk-->daily x 1 wk and stop  Cont mucinex as ordered  Cont other meds as ordered  PT/OT/ST as indicated  Cont nutritional supplements as ordered  Wound care as indicated to buttock area  Will follow  Ramesh Moan S. Perlie Gold  [facility] [address] [place], [place] [address] [phone] Cell (Monday-Friday 8 AM - 5 PM) [phone] After 5 PM and follow prompts

## 2016-03-29 ENCOUNTER — Ambulatory Visit: Payer: Medicare Other | Admitting: Psychiatry

## 2016-03-30 DIAGNOSIS — L259 Unspecified contact dermatitis, unspecified cause: Secondary | ICD-10-CM | POA: Diagnosis not present

## 2016-03-30 DIAGNOSIS — L89323 Pressure ulcer of left buttock, stage 3: Secondary | ICD-10-CM | POA: Diagnosis not present

## 2016-03-31 ENCOUNTER — Encounter: Payer: Self-pay | Admitting: Adult Health

## 2016-03-31 ENCOUNTER — Non-Acute Institutional Stay (SKILLED_NURSING_FACILITY): Payer: Medicare Other | Admitting: Adult Health

## 2016-03-31 DIAGNOSIS — K562 Volvulus: Secondary | ICD-10-CM

## 2016-03-31 DIAGNOSIS — I5032 Chronic diastolic (congestive) heart failure: Secondary | ICD-10-CM

## 2016-03-31 DIAGNOSIS — I482 Chronic atrial fibrillation, unspecified: Secondary | ICD-10-CM

## 2016-03-31 DIAGNOSIS — D638 Anemia in other chronic diseases classified elsewhere: Secondary | ICD-10-CM | POA: Diagnosis not present

## 2016-03-31 DIAGNOSIS — N183 Chronic kidney disease, stage 3 unspecified: Secondary | ICD-10-CM

## 2016-03-31 DIAGNOSIS — I110 Hypertensive heart disease with heart failure: Secondary | ICD-10-CM | POA: Diagnosis not present

## 2016-03-31 DIAGNOSIS — M545 Low back pain, unspecified: Secondary | ICD-10-CM

## 2016-03-31 DIAGNOSIS — J9611 Chronic respiratory failure with hypoxia: Secondary | ICD-10-CM

## 2016-03-31 DIAGNOSIS — G8929 Other chronic pain: Secondary | ICD-10-CM

## 2016-03-31 NOTE — Progress Notes (Signed)
Patient ID: Maria Christian, female   DOB: [birth year], 82 y.o.   MRN: [id number]    Location:   [facility] Room Number: 220-A Place of Service:  SNF (31)   CODE STATUS: DNR  Allergies  Allergen Reactions  . Aspirin Other (See Comments)    [name]    Chief Complaint  Patient presents with  . Medical Management of Chronic Issues    Follow up    HPI:  She is long term resident of this facility being seen for the management of her chronic illnesses. Overall her status is stable. She is out of bed daily; does engage in activities. She tells me that she is feeling good. There are no nursing concerns at this time.    Past Medical History:  Diagnosis Date  . Altered mental status   . Anemia   . Atrial fibrillation ([place])   . Constipation 02/27/2015  . Coronary artery disease   . DEMENTIA   . Depression   . Diabetes mellitus   . Edema of lower extremity 07/13/11   right leg more swollen than left leg  . Encephalopathy   . Enlarged heart   . Esophageal dysmotility 07/02/12  . GERD (gastroesophageal reflux disease)   . History of adenomatous polyp of colon 06/24/99  . Horseshoe kidney   . Hyperlipemia   . Hypertension   . Pancreatic lesion 05/22/11   no further workup  per PCP/family due to age  . Parkinson disease ([place])   . Peripheral neuropathy ([place])   . Sigmoid volvulus ([place]) 05/24/2013  . Thrombophlebitis   . TIA (transient ischemic attack) 12/19/2012    Past Surgical History:  Procedure Laterality Date  . ABDOMINAL HYSTERECTOMY    . BREAST SURGERY     2 benign tumors removed left breast  . CHOLECYSTECTOMY    . ESOPHAGEAL DILATION     several times by Dr. [name]  . ESOPHAGOGASTRODUODENOSCOPY (EGD) WITH ESOPHAGEAL DILATION N/A 08/01/2012   Procedure: ESOPHAGOGASTRODUODENOSCOPY (EGD) WITH ESOPHAGEAL DILATION;  Surgeon: [name], MD;  Location: [facility];  Service: Endoscopy;  Laterality: N/A;  with c-arm savory dilators  .  ESOPHAGOGASTRODUODENOSCOPY (EGD) WITH PROPOFOL N/A 03/12/2015   Procedure: ESOPHAGOGASTRODUODENOSCOPY (EGD) WITH PROPOFOL;  Surgeon: [name], MD;  [facility];  Service: Endoscopy;  Laterality: N/A;  . FLEXIBLE SIGMOIDOSCOPY N/A 05/24/2013   Procedure: FLEXIBLE SIGMOIDOSCOPY;  Surgeon: [name], MD;  [facility];  Service: Endoscopy;  Laterality: N/A;  . FLEXIBLE SIGMOIDOSCOPY N/A 05/26/2013   Procedure:  flex with decompression of sigmoid volvulus;  Surgeon: [name], MD;  [facility];  Service: Endoscopy;  Laterality: N/A;  . FLEXIBLE SIGMOIDOSCOPY N/A 12/20/2015   Procedure: FLEXIBLE SIGMOIDOSCOPY;  Surgeon: Milus Banister, MD;  [facility];  Service: Endoscopy;  Laterality: N/A;  . FOOT SURGERY     benign tumors from foot  . NOSE SURGERY    . SHOULDER SURGERY  2001   left clavicle excision and acromioplasty  . TOTAL HIP ARTHROPLASTY      Social History   Social History  . Marital status: Widowed    Spouse name: N/A  . Number of children: 5  . Years of education: 10th   Occupational History  . Retired    Social History Main Topics  . Smoking status: Former Smoker    Quit date: 10/09/1964  . Smokeless tobacco: Never Used  . Alcohol use No  . Drug use: No  .  Sexual activity: No   Other Topics Concern  . Not on file   Social History Narrative   Pt lives at [facility] and [place].   Caffeine Use: very small amount daily   Family History  Problem Relation Age of Onset  . Cancer    . Heart disease        VITAL SIGNS BP (!) 124/58   Pulse 84   Temp 98.3 F (36.8 C) (Oral)   Resp 20   Ht 5\' 7"  (1.702 m)   Wt 144 lb 8 oz (65.5 kg)   SpO2 96%   BMI 22.63 kg/m   Patient's Medications  New Prescriptions   No medications on file  Previous Medications   ACETAMINOPHEN (TYLENOL) 325 MG TABLET    Take 2 tablets (650 mg total) by mouth every 6 (six) hours as needed for mild pain.   AMLODIPINE  (NORVASC) 2.5 MG TABLET    Take 2.5 mg by mouth every morning. Hold for BP < 100/60 or HR < 60   ASPIRIN EC 81 MG TABLET    Take 81 mg by mouth daily.   BACLOFEN (LIORESAL) 10 MG TABLET    Take 5 mg by mouth at bedtime.   BENZOCAINE-MENTHOL (CHLORASEPTIC) 6-10 MG LOZENGE    Take 1 lozenge by mouth as needed for sore throat.   BIOTIN 5 MG/ML LIQD    Take 5 mLs by mouth 2 (two) times daily.    CARVEDILOL (COREG) 3.125 MG TABLET    Take 3.125 mg by mouth daily.   CHOLECALCIFEROL (VITAMIN D) 1000 UNITS TABLET    Take 1,000 Units by mouth every morning.    CRANBERRY 475 MG CAPS    Take 1 capsule by mouth 2 (two) times daily.    DOCUSATE SODIUM (COLACE) 100 MG CAPSULE    Take 200 mg by mouth at bedtime.    DORZOLAMIDE (TRUSOPT) 2 % OPHTHALMIC SOLUTION    Place 1 drop into both eyes 3 (three) times daily. Reported on 11/16/2015        0800,1400,2000   ESCITALOPRAM (LEXAPRO) 10 MG TABLET    Take 1 tablet (10 mg total) by mouth daily.   FERROUS SULFATE 325 (65 FE) MG TABLET    Take 325 mg by mouth daily with breakfast.   FLUTICASONE (FLONASE) 50 MCG/ACT NASAL SPRAY    Place 2 sprays into both nostrils at bedtime.   GABAPENTIN (NEURONTIN) 100 MG CAPSULE    Take 200 mg by mouth at bedtime.    LIDOCAINE (LIDODERM) 5 %    Place 1 patch onto the skin daily. Apply 1 patch to left lower back/hip every morning.  Remove & Discard patch within 12 hours or as directed by MD   LINACLOTIDE (LINZESS) 145 MCG CAPS CAPSULE    Take 145 mcg by mouth daily before breakfast.    LORATADINE (CLARITIN) 10 MG TABLET    Take 10 mg by mouth daily before breakfast.    MULTIPLE VITAMINS-MINERALS (CERTAGEN PO)    Take 1 tablet by mouth daily.    ONDANSETRON (ZOFRAN) 8 MG TABLET    Take 8 mg by mouth every 8 (eight) hours as needed for nausea or vomiting.   OXYGEN    Inhale 2 L into the lungs continuous.   POLYETHYLENE GLYCOL (MIRALAX / GLYCOLAX) PACKET    Take 17 g by mouth daily at 6 (six) AM.    POTASSIUM CHLORIDE SA  (K-DUR,KLOR-CON) 20 MEQ TABLET    Take 20 mEq  by mouth daily.    PROTONIX 40 MG PACK    Take 40 mg by mouth daily at 6 (six) AM.   SACCHAROMYCES BOULARDII (FLORASTOR) 250 MG CAPSULE    Take 250 mg by mouth 2 (two) times daily.   SENNA (SENOKOT) 8.6 MG TABS TABLET    Take 1 tablet (8.6 mg total) by mouth 2 (two) times daily.   SODIUM CHLORIDE (OCEAN) 0.65 % SOLN NASAL SPRAY    Place 1 spray into both nostrils as directed. Four times daily And every 24 hours as needed to moisturize nasal passages.   TORSEMIDE (DEMADEX) 20 MG TABLET    Take 20 mg by mouth daily.   TRAVOPROST, BAK FREE, (TRAVATAN) 0.004 % SOLN OPHTHALMIC SOLUTION    Place 1 drop into both eyes daily at 8 pm.    VITAMIN B-12 (CYANOCOBALAMIN) 500 MCG TABLET    Take 500 mcg by mouth every morning.   Modified Medications   No medications on file  Discontinued Medications   No medications on file     SIGNIFICANT DIAGNOSTIC EXAMS  02-15-15: abdominal ultrasound: 1. Gallbladder not visualized. 2. No hydronephrosis no renal calculus; 3. The pancreas not well visualized; which could be related to gassy abdomen but an appearance which might represent pancreatitis in the appropriate clinical setting.   02-26-15: right lower extremity doppler: - No evidence of deep vein thrombosis involving the right lower   extremity. - No evidence of Baker&'s cyst on the right.  03-12-15: upper endoscopy: duodenal bulb 1.5-2.0 cm ulcer with clot at base. No active bleeding. Surrounding mucosa was edematous. Biopsy of the surrounding mucosa were done.   12-20-15: ct of abdomen and pelvis: 1. Loosely twisted sigmoid volvulus with transition point. Proximal distention is improved compared to radiography earlier today suggesting partial interval decompression. 2. Chronic findings are stable and described above.  12-20-15: flex sigmoidoscopy: chronic intermittent sigmoid volvulus. The site of volvulus was only slightly twisted during this exam, easily passed  with colonoscope and the air filled dilated colon proximal to it was suctioned extensively. Her abdomen was flat, normal after the exam.   02-19-16: right lower extremity doppler: neg dvt  02-23-16: renal ultrasound: normal right kidney slightly small left kidney. Right 1.9 cm cyst no hydronephrosis or stones   03-19-16: chest x-ray: modest cardiomegaly with mild pulmonary venous congestion    LABS REVIEWED:     04-30-15: H-pylori: neg  10-14-15: wbc 5.0; hgb 11.5; hct 33.9; mcv 101.2; plt 127; glucose 132; bun 15.8; creat 1.27; k+ 3.8; na++ 143; liver normal albumin 4.0  12-20-15: wbc 5.2; hgb 11.0; hct 32.4; mcv 101.3; plt 107; glucose 138; bun 31; creat 1.61; k+ 3.2; na++ 138; liver normal albumin 3.9; urine culture: e-coli: rocephin 12-21-15: wbc 3.9; hgb 9.8; hct 29.7; mcv 103.1; plt 98; glucose 108; bun 23; creat 1.15; k+ 3.2; na++ 143; mag 2.4 12-22-15: wbc 5.1; hgb 10.8; hct 32.3; mcv 103.2; plt 101; glucose 125; bun 18; creat 1.09; k+ 3.9; na++ 142  01-04-16: wbc 4.1; hgb 10.9; hct 32.4; mcv 100.5; plt 175; glucose 121; bun 13.2; creat 1.18; k+ 3.8; na++ 144; liver normal albumin 3.8 01-31-16; urine culture: e-coli: ESBL invanz 02-15-16: wbc 4.6; hgb 11.3; hct 36.3; mcv 102.7; plt 128; glucose 160; bun 15.3; creat 1.65; k+ 4.5; na++ 143; liver normal albumin 3.9 02-19-16: glucose 116; bun 28.7; mcv 1.72; k+ 4.5; na++ 143 02-23-16; wbc 3.5 hgb 9.7; hct 30.4; mcv 101.8; plt 126; glucose 118; bun 25.6; creat 1.52; k+  4.6; na++ 144     Review of Systems  Constitutional: Negative for appetite change and fatigue.  HENT: Negative for congestion.   Respiratory: Negative for cough, chest tightness and shortness of breath.   Cardiovascular: Negative for chest pain, palpitations and leg swelling.  Gastrointestinal: Negative for nausea, abdominal pain, diarrhea and constipation.  Musculoskeletal: no complaint of muscle or joint pain    Skin: Negative for pallor.  Neurological: Negative for  dizziness.  Psychiatric/Behavioral: The patient is not nervous/anxious.       Physical Exam  Constitutional: No distress.  Eyes: Conjunctivae are normal.  Neck: Neck supple. No JVD present. No thyromegaly present.  Cardiovascular: Normal rate, regular rhythm and intact distal pulses.   Respiratory: Effort normal and breath sounds normal. No respiratory distress. She has no wheezes.  Is 02 dependent  GI: Soft. Bowel sounds are normal.  no tenderness Musculoskeletal: She exhibits no edema.  Able to move all extremities .   Lymphadenopathy:    She has no cervical adenopathy.  Neurological: She is alert.  Skin: Skin is warm and dry. She is not diaphoretic.  Psychiatric: She has a normal mood and affect.     ASSESSMENT/ PLAN:  1. Hypertension: will continue norvasc 2.5 mg daily coreg 3.15 mg daily   2. Chronic diastolic heart failure: is presently not on diuretic  Will not make changes will monitor   3. Duodenal ulcer: will continue protonix 40 mg  daily    4. UTI: will continue cranberry daily      5. Anemia: will continue iron daily hgb is 9.7  6. Chronic afib: will continue coreg 3.125 mg daily for rate control. Will continue asa 81mg  daily .    7. Chronic respiratory failure: is 02 dependent.   8. Allergic rhinitis: will continue flonase nightly and claritin 10 mg daily   9. Chronic pain due to osteoarthritis: will continue lidoderm to lower back daily; will continue baclofen 5 mg nightly for leg spasticity and will monitor   10. Depression: is stable receives benefit form lexapro 10 mg daily   11. Chronic constipation: has sigmoid volvulus: will continue colace 200 mg daily linzess 145 mcg daily; senna twice daily miralax daily   12. Glaucoma: will continue  trusopt to both eyes three times daily; travatan to both eyes nightly   13. Low back pain with radiculopathy: will continue neurontin 100 mg nightly for her bilateral foot burning/pain   14. CKD stage III:  bun/creat25.6/1.52    Ok Edwards NP [facility]  Contact [phone] Monday through Friday 8am- 5pm  After hours call [phone]

## 2016-04-01 DIAGNOSIS — Z79899 Other long term (current) drug therapy: Secondary | ICD-10-CM | POA: Diagnosis not present

## 2016-04-01 DIAGNOSIS — N183 Chronic kidney disease, stage 3 (moderate): Secondary | ICD-10-CM | POA: Diagnosis not present

## 2016-04-03 DIAGNOSIS — J0110 Acute frontal sinusitis, unspecified: Secondary | ICD-10-CM | POA: Insufficient documentation

## 2016-04-06 DIAGNOSIS — L89323 Pressure ulcer of left buttock, stage 3: Secondary | ICD-10-CM | POA: Diagnosis not present

## 2016-04-07 DIAGNOSIS — D696 Thrombocytopenia, unspecified: Secondary | ICD-10-CM | POA: Diagnosis not present

## 2016-04-07 DIAGNOSIS — I4891 Unspecified atrial fibrillation: Secondary | ICD-10-CM | POA: Diagnosis not present

## 2016-04-07 DIAGNOSIS — E785 Hyperlipidemia, unspecified: Secondary | ICD-10-CM | POA: Diagnosis not present

## 2016-04-07 DIAGNOSIS — E119 Type 2 diabetes mellitus without complications: Secondary | ICD-10-CM | POA: Diagnosis not present

## 2016-04-07 LAB — BASIC METABOLIC PANEL
BUN: 18 mg/dL (ref 4–21)
Creatinine: 1.4 mg/dL — AB (ref 0.5–1.1)
Glucose: 140 mg/dL
Potassium: 4.1 mmol/L (ref 3.4–5.3)
SODIUM: 143 mmol/L (ref 137–147)

## 2016-04-13 DIAGNOSIS — L259 Unspecified contact dermatitis, unspecified cause: Secondary | ICD-10-CM | POA: Diagnosis not present

## 2016-04-17 ENCOUNTER — Encounter (HOSPITAL_COMMUNITY)
Admission: EM | Disposition: A | Discharge status: Skilled Nursing Facility | Payer: Self-pay | Source: Home / Self Care | Attending: Internal Medicine

## 2016-04-17 ENCOUNTER — Encounter: Payer: Self-pay | Admitting: Internal Medicine

## 2016-04-17 ENCOUNTER — Encounter (HOSPITAL_COMMUNITY): Payer: Self-pay | Admitting: *Deleted

## 2016-04-17 ENCOUNTER — Inpatient Hospital Stay (HOSPITAL_COMMUNITY)
Admission: EM | Admit: 2016-04-17 | Discharge: 2016-04-24 | DRG: 389 | Disposition: A | Discharge status: Skilled Nursing Facility | Payer: Medicare Other | Attending: Internal Medicine | Admitting: Internal Medicine

## 2016-04-17 ENCOUNTER — Non-Acute Institutional Stay (SKILLED_NURSING_FACILITY): Payer: Medicare Other | Admitting: Internal Medicine

## 2016-04-17 ENCOUNTER — Emergency Department (HOSPITAL_COMMUNITY): Payer: Medicare Other

## 2016-04-17 DIAGNOSIS — R1084 Generalized abdominal pain: Secondary | ICD-10-CM

## 2016-04-17 DIAGNOSIS — I5041 Acute combined systolic (congestive) and diastolic (congestive) heart failure: Secondary | ICD-10-CM | POA: Diagnosis not present

## 2016-04-17 DIAGNOSIS — F411 Generalized anxiety disorder: Secondary | ICD-10-CM | POA: Diagnosis not present

## 2016-04-17 DIAGNOSIS — Z8672 Personal history of thrombophlebitis: Secondary | ICD-10-CM | POA: Diagnosis not present

## 2016-04-17 DIAGNOSIS — Z9049 Acquired absence of other specified parts of digestive tract: Secondary | ICD-10-CM

## 2016-04-17 DIAGNOSIS — R5381 Other malaise: Secondary | ICD-10-CM | POA: Diagnosis not present

## 2016-04-17 DIAGNOSIS — D638 Anemia in other chronic diseases classified elsewhere: Secondary | ICD-10-CM | POA: Diagnosis present

## 2016-04-17 DIAGNOSIS — I2510 Atherosclerotic heart disease of native coronary artery without angina pectoris: Secondary | ICD-10-CM | POA: Diagnosis present

## 2016-04-17 DIAGNOSIS — Z9981 Dependence on supplemental oxygen: Secondary | ICD-10-CM | POA: Diagnosis not present

## 2016-04-17 DIAGNOSIS — I482 Chronic atrial fibrillation, unspecified: Secondary | ICD-10-CM | POA: Diagnosis present

## 2016-04-17 DIAGNOSIS — E860 Dehydration: Secondary | ICD-10-CM | POA: Diagnosis present

## 2016-04-17 DIAGNOSIS — Z8673 Personal history of transient ischemic attack (TIA), and cerebral infarction without residual deficits: Secondary | ICD-10-CM

## 2016-04-17 DIAGNOSIS — M179 Osteoarthritis of knee, unspecified: Secondary | ICD-10-CM | POA: Diagnosis not present

## 2016-04-17 DIAGNOSIS — Z96642 Presence of left artificial hip joint: Secondary | ICD-10-CM | POA: Diagnosis present

## 2016-04-17 DIAGNOSIS — G8929 Other chronic pain: Secondary | ICD-10-CM | POA: Diagnosis not present

## 2016-04-17 DIAGNOSIS — G20 Parkinson's disease: Secondary | ICD-10-CM | POA: Diagnosis present

## 2016-04-17 DIAGNOSIS — J9610 Chronic respiratory failure, unspecified whether with hypoxia or hypercapnia: Secondary | ICD-10-CM | POA: Diagnosis present

## 2016-04-17 DIAGNOSIS — R109 Unspecified abdominal pain: Secondary | ICD-10-CM | POA: Diagnosis not present

## 2016-04-17 DIAGNOSIS — R933 Abnormal findings on diagnostic imaging of other parts of digestive tract: Secondary | ICD-10-CM | POA: Diagnosis not present

## 2016-04-17 DIAGNOSIS — Z7951 Long term (current) use of inhaled steroids: Secondary | ICD-10-CM

## 2016-04-17 DIAGNOSIS — E1142 Type 2 diabetes mellitus with diabetic polyneuropathy: Secondary | ICD-10-CM | POA: Diagnosis present

## 2016-04-17 DIAGNOSIS — L89152 Pressure ulcer of sacral region, stage 2: Secondary | ICD-10-CM | POA: Diagnosis not present

## 2016-04-17 DIAGNOSIS — Z86010 Personal history of colonic polyps: Secondary | ICD-10-CM

## 2016-04-17 DIAGNOSIS — N183 Chronic kidney disease, stage 3 unspecified: Secondary | ICD-10-CM | POA: Diagnosis present

## 2016-04-17 DIAGNOSIS — Z7401 Bed confinement status: Secondary | ICD-10-CM

## 2016-04-17 DIAGNOSIS — I5032 Chronic diastolic (congestive) heart failure: Secondary | ICD-10-CM | POA: Diagnosis not present

## 2016-04-17 DIAGNOSIS — F0280 Dementia in other diseases classified elsewhere without behavioral disturbance: Secondary | ICD-10-CM | POA: Diagnosis not present

## 2016-04-17 DIAGNOSIS — J9611 Chronic respiratory failure with hypoxia: Secondary | ICD-10-CM

## 2016-04-17 DIAGNOSIS — E876 Hypokalemia: Secondary | ICD-10-CM | POA: Diagnosis not present

## 2016-04-17 DIAGNOSIS — R1111 Vomiting without nausea: Secondary | ICD-10-CM | POA: Diagnosis not present

## 2016-04-17 DIAGNOSIS — R112 Nausea with vomiting, unspecified: Secondary | ICD-10-CM

## 2016-04-17 DIAGNOSIS — K5901 Slow transit constipation: Secondary | ICD-10-CM | POA: Diagnosis not present

## 2016-04-17 DIAGNOSIS — R300 Dysuria: Secondary | ICD-10-CM | POA: Diagnosis not present

## 2016-04-17 DIAGNOSIS — E119 Type 2 diabetes mellitus without complications: Secondary | ICD-10-CM

## 2016-04-17 DIAGNOSIS — E785 Hyperlipidemia, unspecified: Secondary | ICD-10-CM | POA: Diagnosis present

## 2016-04-17 DIAGNOSIS — L89899 Pressure ulcer of other site, unspecified stage: Secondary | ICD-10-CM | POA: Diagnosis present

## 2016-04-17 DIAGNOSIS — K264 Chronic or unspecified duodenal ulcer with hemorrhage: Secondary | ICD-10-CM | POA: Diagnosis not present

## 2016-04-17 DIAGNOSIS — F0390 Unspecified dementia without behavioral disturbance: Secondary | ICD-10-CM

## 2016-04-17 DIAGNOSIS — Z87891 Personal history of nicotine dependence: Secondary | ICD-10-CM

## 2016-04-17 DIAGNOSIS — D599 Acquired hemolytic anemia, unspecified: Secondary | ICD-10-CM | POA: Diagnosis not present

## 2016-04-17 DIAGNOSIS — K5909 Other constipation: Secondary | ICD-10-CM | POA: Diagnosis not present

## 2016-04-17 DIAGNOSIS — Z7982 Long term (current) use of aspirin: Secondary | ICD-10-CM

## 2016-04-17 DIAGNOSIS — B029 Zoster without complications: Secondary | ICD-10-CM | POA: Diagnosis not present

## 2016-04-17 DIAGNOSIS — Z993 Dependence on wheelchair: Secondary | ICD-10-CM | POA: Diagnosis not present

## 2016-04-17 DIAGNOSIS — K598 Other specified functional intestinal disorders: Secondary | ICD-10-CM | POA: Diagnosis not present

## 2016-04-17 DIAGNOSIS — K562 Volvulus: Secondary | ICD-10-CM | POA: Diagnosis not present

## 2016-04-17 DIAGNOSIS — K219 Gastro-esophageal reflux disease without esophagitis: Secondary | ICD-10-CM | POA: Diagnosis present

## 2016-04-17 DIAGNOSIS — K6389 Other specified diseases of intestine: Secondary | ICD-10-CM | POA: Diagnosis not present

## 2016-04-17 DIAGNOSIS — I130 Hypertensive heart and chronic kidney disease with heart failure and stage 1 through stage 4 chronic kidney disease, or unspecified chronic kidney disease: Secondary | ICD-10-CM | POA: Diagnosis present

## 2016-04-17 DIAGNOSIS — Z66 Do not resuscitate: Secondary | ICD-10-CM | POA: Diagnosis present

## 2016-04-17 DIAGNOSIS — F339 Major depressive disorder, recurrent, unspecified: Secondary | ICD-10-CM | POA: Diagnosis not present

## 2016-04-17 DIAGNOSIS — R05 Cough: Secondary | ICD-10-CM | POA: Diagnosis not present

## 2016-04-17 DIAGNOSIS — M542 Cervicalgia: Secondary | ICD-10-CM | POA: Diagnosis not present

## 2016-04-17 DIAGNOSIS — G43909 Migraine, unspecified, not intractable, without status migrainosus: Secondary | ICD-10-CM | POA: Diagnosis not present

## 2016-04-17 DIAGNOSIS — Q631 Lobulated, fused and horseshoe kidney: Secondary | ICD-10-CM

## 2016-04-17 DIAGNOSIS — I25110 Atherosclerotic heart disease of native coronary artery with unstable angina pectoris: Secondary | ICD-10-CM | POA: Diagnosis not present

## 2016-04-17 DIAGNOSIS — R1311 Dysphagia, oral phase: Secondary | ICD-10-CM | POA: Diagnosis not present

## 2016-04-17 DIAGNOSIS — D696 Thrombocytopenia, unspecified: Secondary | ICD-10-CM | POA: Diagnosis present

## 2016-04-17 DIAGNOSIS — N390 Urinary tract infection, site not specified: Secondary | ICD-10-CM | POA: Diagnosis not present

## 2016-04-17 DIAGNOSIS — I10 Essential (primary) hypertension: Secondary | ICD-10-CM | POA: Diagnosis not present

## 2016-04-17 DIAGNOSIS — K648 Other hemorrhoids: Secondary | ICD-10-CM | POA: Diagnosis present

## 2016-04-17 DIAGNOSIS — F0391 Unspecified dementia with behavioral disturbance: Secondary | ICD-10-CM | POA: Diagnosis not present

## 2016-04-17 DIAGNOSIS — R059 Cough, unspecified: Secondary | ICD-10-CM

## 2016-04-17 DIAGNOSIS — M5032 Other cervical disc degeneration, mid-cervical region, unspecified level: Secondary | ICD-10-CM | POA: Diagnosis not present

## 2016-04-17 DIAGNOSIS — F03918 Unspecified dementia, unspecified severity, with other behavioral disturbance: Secondary | ICD-10-CM | POA: Diagnosis present

## 2016-04-17 DIAGNOSIS — J449 Chronic obstructive pulmonary disease, unspecified: Secondary | ICD-10-CM | POA: Diagnosis not present

## 2016-04-17 DIAGNOSIS — K5939 Other megacolon: Secondary | ICD-10-CM

## 2016-04-17 DIAGNOSIS — M6281 Muscle weakness (generalized): Secondary | ICD-10-CM | POA: Diagnosis not present

## 2016-04-17 DIAGNOSIS — R100 Acute abdomen: Secondary | ICD-10-CM | POA: Diagnosis not present

## 2016-04-17 DIAGNOSIS — K260 Acute duodenal ulcer with hemorrhage: Secondary | ICD-10-CM | POA: Diagnosis not present

## 2016-04-17 DIAGNOSIS — E1122 Type 2 diabetes mellitus with diabetic chronic kidney disease: Secondary | ICD-10-CM | POA: Diagnosis present

## 2016-04-17 DIAGNOSIS — G4700 Insomnia, unspecified: Secondary | ICD-10-CM | POA: Diagnosis not present

## 2016-04-17 DIAGNOSIS — R531 Weakness: Secondary | ICD-10-CM | POA: Diagnosis not present

## 2016-04-17 DIAGNOSIS — N200 Calculus of kidney: Secondary | ICD-10-CM | POA: Diagnosis not present

## 2016-04-17 DIAGNOSIS — J309 Allergic rhinitis, unspecified: Secondary | ICD-10-CM | POA: Diagnosis not present

## 2016-04-17 DIAGNOSIS — R52 Pain, unspecified: Secondary | ICD-10-CM

## 2016-04-17 DIAGNOSIS — K567 Ileus, unspecified: Secondary | ICD-10-CM | POA: Diagnosis not present

## 2016-04-17 DIAGNOSIS — K2970 Gastritis, unspecified, without bleeding: Secondary | ICD-10-CM | POA: Diagnosis not present

## 2016-04-17 HISTORY — PX: FLEXIBLE SIGMOIDOSCOPY: SHX5431

## 2016-04-17 LAB — I-STAT TROPONIN, ED: Troponin i, poc: 0.01 ng/mL (ref 0.00–0.08)

## 2016-04-17 LAB — COMPREHENSIVE METABOLIC PANEL
ALBUMIN: 3.8 g/dL (ref 3.5–5.0)
ALK PHOS: 89 U/L (ref 38–126)
ALT: 12 U/L — AB (ref 14–54)
ANION GAP: 7 (ref 5–15)
AST: 20 U/L (ref 15–41)
BUN: 19 mg/dL (ref 6–20)
CHLORIDE: 109 mmol/L (ref 101–111)
CO2: 25 mmol/L (ref 22–32)
CREATININE: 1.22 mg/dL — AB (ref 0.44–1.00)
Calcium: 9.1 mg/dL (ref 8.9–10.3)
GFR calc non Af Amer: 38 mL/min — ABNORMAL LOW (ref >60)
GFR, EST AFRICAN AMERICAN: 44 mL/min — AB (ref >60)
GLUCOSE: 178 mg/dL — AB (ref 65–99)
Potassium: 3.7 mmol/L (ref 3.5–5.1)
SODIUM: 141 mmol/L (ref 135–145)
Total Bilirubin: 0.8 mg/dL (ref 0.3–1.2)
Total Protein: 7.2 g/dL (ref 6.5–8.1)

## 2016-04-17 LAB — CBC WITH DIFFERENTIAL/PLATELET
Basophils Absolute: 0 10*3/uL (ref 0.0–0.1)
Basophils Relative: 1 %
Eosinophils Absolute: 0.2 10*3/uL (ref 0.0–0.7)
Eosinophils Relative: 3 %
HEMATOCRIT: 33.3 % — AB (ref 36.0–46.0)
HEMOGLOBIN: 11.1 g/dL — AB (ref 12.0–15.0)
LYMPHS ABS: 1.4 10*3/uL (ref 0.7–4.0)
Lymphocytes Relative: 28 %
MCH: 33.5 pg (ref 26.0–34.0)
MCHC: 33.3 g/dL (ref 30.0–36.0)
MCV: 100.6 fL — ABNORMAL HIGH (ref 78.0–100.0)
MONO ABS: 0.4 10*3/uL (ref 0.1–1.0)
MONOS PCT: 8 %
NEUTROS ABS: 3 10*3/uL (ref 1.7–7.7)
NEUTROS PCT: 60 %
Platelets: 119 10*3/uL — ABNORMAL LOW (ref 150–400)
RBC: 3.31 MIL/uL — ABNORMAL LOW (ref 3.87–5.11)
RDW: 13.9 % (ref 11.5–15.5)
WBC: 5 10*3/uL (ref 4.0–10.5)

## 2016-04-17 LAB — I-STAT CG4 LACTIC ACID, ED: Lactic Acid, Venous: 1.17 mmol/L (ref 0.5–1.9)

## 2016-04-17 SURGERY — SIGMOIDOSCOPY, FLEXIBLE
Anesthesia: Moderate Sedation

## 2016-04-17 MED ORDER — MENTHOL 3 MG MT LOZG
1.0000 | LOZENGE | OROMUCOSAL | Status: DC | PRN
  Filled 2016-04-17: qty 9

## 2016-04-17 MED ORDER — ONDANSETRON HCL 4 MG PO TABS: 4.0000 mg | ORAL_TABLET | Freq: Four times a day (QID) | ORAL | Status: DC | PRN

## 2016-04-17 MED ORDER — DOCUSATE SODIUM 100 MG PO CAPS: 200.0000 mg | ORAL_CAPSULE | Freq: Every day | ORAL | Status: DC

## 2016-04-17 MED ORDER — MIDAZOLAM HCL 10 MG/2ML IJ SOLN
INTRAMUSCULAR | Status: DC | PRN
  Administered 2016-04-17 (×2): 1 mg via INTRAVENOUS

## 2016-04-17 MED ORDER — DORZOLAMIDE HCL 2 % OP SOLN
1.0000 [drp] | Freq: Three times a day (TID) | OPHTHALMIC | Status: DC
  Administered 2016-04-18 – 2016-04-24 (×19): 1 [drp] via OPHTHALMIC
  Filled 2016-04-17: qty 10

## 2016-04-17 MED ORDER — HYDROCODONE-ACETAMINOPHEN 5-325 MG PO TABS
1.0000 | ORAL_TABLET | ORAL | Status: DC | PRN
  Administered 2016-04-18 – 2016-04-23 (×4): 1 via ORAL
  Filled 2016-04-17 (×4): qty 1

## 2016-04-17 MED ORDER — LINACLOTIDE 145 MCG PO CAPS
145.0000 ug | ORAL_CAPSULE | Freq: Every day | ORAL | Status: DC
  Administered 2016-04-19 – 2016-04-24 (×6): 145 ug via ORAL
  Filled 2016-04-17 (×7): qty 1

## 2016-04-17 MED ORDER — BACLOFEN 10 MG PO TABS
5.0000 mg | ORAL_TABLET | Freq: Every day | ORAL | Status: DC
  Administered 2016-04-18 – 2016-04-23 (×7): 5 mg via ORAL
  Filled 2016-04-17 (×7): qty 1

## 2016-04-17 MED ORDER — AMLODIPINE BESYLATE 5 MG PO TABS
2.5000 mg | ORAL_TABLET | Freq: Every day | ORAL | Status: DC
  Administered 2016-04-19 – 2016-04-20 (×2): 2.5 mg via ORAL
  Filled 2016-04-17 (×4): qty 1

## 2016-04-17 MED ORDER — SODIUM CHLORIDE 0.9% FLUSH
3.0000 mL | Freq: Two times a day (BID) | INTRAVENOUS | Status: DC
  Administered 2016-04-17 – 2016-04-24 (×5): 3 mL via INTRAVENOUS

## 2016-04-17 MED ORDER — FENTANYL CITRATE (PF) 100 MCG/2ML IJ SOLN
INTRAMUSCULAR | Status: DC | PRN
  Administered 2016-04-17 (×2): 12.5 ug via INTRAVENOUS

## 2016-04-17 MED ORDER — ASPIRIN EC 81 MG PO TBEC
81.0000 mg | DELAYED_RELEASE_TABLET | Freq: Every day | ORAL | Status: DC
  Administered 2016-04-19 – 2016-04-24 (×5): 81 mg via ORAL
  Filled 2016-04-17 (×7): qty 1

## 2016-04-17 MED ORDER — POLYETHYLENE GLYCOL 3350 17 G PO PACK
17.0000 g | PACK | Freq: Every day | ORAL | Status: DC
  Administered 2016-04-19 – 2016-04-21 (×3): 17 g via ORAL
  Filled 2016-04-17 (×3): qty 1

## 2016-04-17 MED ORDER — ESCITALOPRAM OXALATE 10 MG PO TABS
10.0000 mg | ORAL_TABLET | Freq: Every day | ORAL | Status: DC
  Administered 2016-04-19 – 2016-04-24 (×6): 10 mg via ORAL
  Filled 2016-04-17 (×7): qty 1

## 2016-04-17 MED ORDER — SENNA 8.6 MG PO TABS
1.0000 | ORAL_TABLET | Freq: Two times a day (BID) | ORAL | Status: DC
Start: 2016-04-17 — End: 2016-04-21
  Administered 2016-04-18 – 2016-04-21 (×6): 8.6 mg via ORAL
  Filled 2016-04-17 (×7): qty 1

## 2016-04-17 MED ORDER — MIDAZOLAM HCL 5 MG/ML IJ SOLN
INTRAMUSCULAR | Status: AC
  Filled 2016-04-17: qty 2

## 2016-04-17 MED ORDER — INSULIN ASPART 100 UNIT/ML ~~LOC~~ SOLN
0.0000 [IU] | SUBCUTANEOUS | Status: DC
  Administered 2016-04-18 – 2016-04-19 (×2): 1 [IU] via SUBCUTANEOUS
  Administered 2016-04-19: 3 [IU] via SUBCUTANEOUS
  Administered 2016-04-19 – 2016-04-20 (×2): 1 [IU] via SUBCUTANEOUS
  Administered 2016-04-20: 2 [IU] via SUBCUTANEOUS
  Administered 2016-04-20: 1 [IU] via SUBCUTANEOUS
  Administered 2016-04-20: 2 [IU] via SUBCUTANEOUS
  Administered 2016-04-20: 1 [IU] via SUBCUTANEOUS
  Administered 2016-04-21: 2 [IU] via SUBCUTANEOUS
  Administered 2016-04-21 – 2016-04-22 (×5): 1 [IU] via SUBCUTANEOUS
  Administered 2016-04-22: 2 [IU] via SUBCUTANEOUS
  Administered 2016-04-22 – 2016-04-23 (×2): 1 [IU] via SUBCUTANEOUS
  Administered 2016-04-23: 2 [IU] via SUBCUTANEOUS
  Administered 2016-04-23 – 2016-04-24 (×3): 1 [IU] via SUBCUTANEOUS

## 2016-04-17 MED ORDER — ONDANSETRON HCL 4 MG/2ML IJ SOLN
4.0000 mg | Freq: Four times a day (QID) | INTRAMUSCULAR | Status: DC | PRN
  Administered 2016-04-20: 4 mg via INTRAVENOUS
  Filled 2016-04-17: qty 2

## 2016-04-17 MED ORDER — IPRATROPIUM-ALBUTEROL 0.5-2.5 (3) MG/3ML IN SOLN: 3.0000 mL | Freq: Every day | RESPIRATORY_TRACT | Status: DC | PRN

## 2016-04-17 MED ORDER — ACETAMINOPHEN 650 MG RE SUPP: 650.0000 mg | Freq: Four times a day (QID) | RECTAL | Status: DC | PRN

## 2016-04-17 MED ORDER — SODIUM CHLORIDE 0.9 % IV SOLN
INTRAVENOUS | Status: DC
  Administered 2016-04-18: 01:00:00 via INTRAVENOUS

## 2016-04-17 MED ORDER — SODIUM CHLORIDE 0.9 % IV SOLN
Freq: Once | INTRAVENOUS | Status: AC
  Administered 2016-04-17: 19:00:00 via INTRAVENOUS

## 2016-04-17 MED ORDER — FENTANYL CITRATE (PF) 100 MCG/2ML IJ SOLN
INTRAMUSCULAR | Status: AC
  Filled 2016-04-17: qty 2

## 2016-04-17 MED ORDER — PANTOPRAZOLE SODIUM 40 MG PO TBEC
40.0000 mg | DELAYED_RELEASE_TABLET | Freq: Every day | ORAL | Status: DC
  Administered 2016-04-18 – 2016-04-24 (×7): 40 mg via ORAL
  Filled 2016-04-17 (×7): qty 1

## 2016-04-17 MED ORDER — ACETAMINOPHEN 325 MG PO TABS
650.0000 mg | ORAL_TABLET | Freq: Four times a day (QID) | ORAL | Status: DC | PRN
  Administered 2016-04-21 (×2): 650 mg via ORAL
  Filled 2016-04-17 (×2): qty 2

## 2016-04-17 MED ORDER — LATANOPROST 0.005 % OP SOLN
1.0000 [drp] | Freq: Every day | OPHTHALMIC | Status: DC
  Administered 2016-04-18 – 2016-04-23 (×7): 1 [drp] via OPHTHALMIC
  Filled 2016-04-17: qty 2.5

## 2016-04-17 MED ORDER — CARVEDILOL 3.125 MG PO TABS
3.1250 mg | ORAL_TABLET | Freq: Every day | ORAL | Status: DC
  Administered 2016-04-19 – 2016-04-20 (×2): 3.125 mg via ORAL
  Filled 2016-04-17 (×4): qty 1

## 2016-04-17 NOTE — ED Provider Notes (Signed)
[facility] DEPT Provider Note   CSN: [id number] Arrival date & time: 04/17/16  1435     History   Chief Complaint No chief complaint on file.   HPI Maria Christian is a 82 y.o. female.  The history is provided by the patient. No language interpreter was used.    Maria Christian is a 82 y.o. female who presents to the Emergency Department complaining of abdominal pain.  She has a history of chronic sigmoid volvulus. Over the last 2 days she endorses increasing abdominal pain and bloating with vomiting this morning. Her last BM was this morning and it was small. She denies any fevers. Symptoms today are similar to prior episodes of volvulus.  Past Medical History:  Diagnosis Date  . Altered mental status   . Anemia   . Atrial fibrillation ([place])   . Constipation 02/27/2015  . Coronary artery disease   . DEMENTIA   . Depression   . Diabetes mellitus   . Edema of lower extremity 07/13/11   right leg more swollen than left leg  . Encephalopathy   . Enlarged heart   . Esophageal dysmotility 07/02/12  . GERD (gastroesophageal reflux disease)   . History of adenomatous polyp of colon 06/24/99  . Horseshoe kidney   . Hyperlipemia   . Hypertension   . Pancreatic lesion 05/22/11   no further workup  per PCP/family due to age  . Parkinson disease ([place])   . Peripheral neuropathy ([place])   . Sigmoid volvulus ([place]) 05/24/2013  . Thrombophlebitis   . TIA (transient ischemic attack) 12/19/2012    Patient Active Problem List   Diagnosis Date Noted  . Acute frontal sinusitis 04/03/2016  . Diarrhea 01/04/2016  . E. coli UTI 01/04/2016  . AKI (acute kidney injury) ([place]) 01/04/2016  . GERD (gastroesophageal reflux disease) 01/04/2016  . DM (diabetes mellitus), type 2 ([place]) 01/04/2016  . Sigmoid volvulus ([place]) 12/20/2015  . Hypertensive heart disease with congestive heart failure ([place]) 12/02/2015  . Vitamin B12 deficiency 09/27/2015  . Skin yeast infection 05/10/2015  . Abrasion of  buttock 05/10/2015  . Infection due to ESBL-producing Escherichia coli 04/19/2015  . Low back pain potentially associated with radiculopathy 04/18/2015  . Chronic respiratory failure ([place]) 03/16/2015  . Allergic rhinitis 03/16/2015  . Chronic pain 03/16/2015  . Osteoarthritis of multiple joints 03/16/2015  . Duodenal ulcer 03/12/2015  . GI bleed 03/11/2015  . Melena 03/11/2015  . Pressure ulcer 03/11/2015  . Headache 03/09/2015  . Abdominal distension 03/01/2015  . Cephalgia 03/01/2015  . Nausea and vomiting 02/27/2015  . Constipation 02/27/2015  . Anemia, iron deficiency 02/27/2015  . Anemia of chronic disease 02/27/2015  . Chronic atrial fibrillation ([place]) 02/27/2015  . Hypokalemia 02/27/2015  . CKD (chronic kidney disease) stage 3, GFR 30-59 ml/min 02/27/2015  . Ileus ([place]) 02/26/2015  . Malnutrition of moderate degree ([place]) 10/10/2013  . FTT (failure to thrive) in adult 07/29/2013  . Dementia without behavioral disturbance 12/19/2012  . Depression 12/19/2012  . Coronary artery disease   . Diastolic CHF, chronic ([place]) 05/24/2011    Past Surgical History:  Procedure Laterality Date  . ABDOMINAL HYSTERECTOMY    . BREAST SURGERY     2 benign tumors removed left breast  . CHOLECYSTECTOMY    . ESOPHAGEAL DILATION     several times by Dr. [name]  . ESOPHAGOGASTRODUODENOSCOPY (EGD) WITH ESOPHAGEAL DILATION N/A 08/01/2012   Procedure: ESOPHAGOGASTRODUODENOSCOPY (EGD) WITH ESOPHAGEAL DILATION;  Surgeon: [name], MD;  [facility];  Service: Endoscopy;  Laterality: N/A;  with c-arm savory dilators  . ESOPHAGOGASTRODUODENOSCOPY (EGD) WITH PROPOFOL N/A 03/12/2015   Procedure: ESOPHAGOGASTRODUODENOSCOPY (EGD) WITH PROPOFOL;  Surgeon: [name], MD;  [facility];  Service: Endoscopy;  Laterality: N/A;  . FLEXIBLE SIGMOIDOSCOPY N/A 05/24/2013   Procedure: FLEXIBLE SIGMOIDOSCOPY;  Surgeon: [name], MD;  [facility];  Service:  Endoscopy;  Laterality: N/A;  . FLEXIBLE SIGMOIDOSCOPY N/A 05/26/2013   Procedure:  flex with decompression of sigmoid volvulus;  Surgeon: [name], MD;  [facility];  Service: Endoscopy;  Laterality: N/A;  . FLEXIBLE SIGMOIDOSCOPY N/A 12/20/2015   Procedure: FLEXIBLE SIGMOIDOSCOPY;  Surgeon: Milus Banister, MD;  [facility];  Service: Endoscopy;  Laterality: N/A;  . FOOT SURGERY     benign tumors from foot  . NOSE SURGERY    . SHOULDER SURGERY  2001   left clavicle excision and acromioplasty  . TOTAL HIP ARTHROPLASTY      OB History    No data available       Home Medications    Prior to Admission medications   Medication Sig Start Date End Date Taking? Authorizing Provider  amLODipine (NORVASC) 2.5 MG tablet Take 2.5 mg by mouth every morning. Hold for BP < 100/60 or HR < 60   Yes Historical Provider, MD  aspirin EC 81 MG tablet Take 81 mg by mouth daily.   Yes Historical Provider, MD  baclofen (LIORESAL) 10 MG tablet Take 5 mg by mouth at bedtime.   Yes Historical Provider, MD  Biotin 5 MG/ML LIQD Take 5 mLs by mouth 2 (two) times daily.    Yes Historical Provider, MD  carvedilol (COREG) 3.125 MG tablet Take 3.125 mg by mouth daily.   Yes Historical Provider, MD  cholecalciferol (VITAMIN D) 1000 UNITS tablet Take 1,000 Units by mouth every morning.    Yes Historical Provider, MD  Cranberry 475 MG CAPS Take 1 capsule by mouth 2 (two) times daily.    Yes Historical Provider, MD  docusate sodium (COLACE) 100 MG capsule Take 200 mg by mouth at bedtime.    Yes Historical Provider, MD  dorzolamide (TRUSOPT) 2 % ophthalmic solution Place 1 drop into both eyes 3 (three) times daily. Reported on 11/16/2015        0800,1400,2000   Yes Historical Provider, MD  escitalopram (LEXAPRO) 10 MG tablet Take 1 tablet (10 mg total) by mouth daily. 12/03/14  Yes Gerlene Fee, NP  ferrous sulfate 325 (65 FE) MG tablet Take 325 mg by mouth daily with breakfast.   Yes  Historical Provider, MD  fluticasone (FLONASE) 50 MCG/ACT nasal spray Place 2 sprays into both nostrils at bedtime.   Yes Historical Provider, MD  ipratropium-albuterol (DUONEB) 0.5-2.5 (3) MG/3ML SOLN Take 3 mLs by nebulization daily as needed (wheezing).   Yes Historical Provider, MD  lidocaine (LIDODERM) 5 % Place 1 patch onto the skin daily. Apply 1 patch to left lower back/hip every morning.  Remove & Discard patch within 12 hours or as directed by MD 07/28/13  Yes Thurnell Lose, MD  Linaclotide (LINZESS) 145 MCG CAPS capsule Take 145 mcg by mouth daily before breakfast.    Yes Historical Provider, MD  loratadine (CLARITIN) 10 MG tablet Take 10 mg by mouth daily before breakfast.    Yes Historical Provider, MD  Multiple Vitamins-Minerals (CERTAGEN PO) Take 1 tablet by mouth daily.    Yes Historical Provider, MD  OXYGEN  Inhale 2 L into the lungs continuous.   Yes Historical Provider, MD  polyethylene glycol (MIRALAX / GLYCOLAX) packet Take 17 g by mouth daily at 6 (six) AM.    Yes Historical Provider, MD  potassium chloride SA (K-DUR,KLOR-CON) 20 MEQ tablet Take 20 mEq by mouth daily.  10/23/13  Yes Janece Canterbury, MD  PROTONIX 40 MG PACK Take 40 mg by mouth daily at 6 (six) AM. 12/06/15  Yes Historical Provider, MD  senna (SENOKOT) 8.6 MG TABS tablet Take 1 tablet (8.6 mg total) by mouth 2 (two) times daily. 01/05/14  Yes Barton Dubois, MD  Skin Protectants, Misc. (CALAZIME SKIN PROTECTANT EX) Apply 1 application topically daily. Buttocks protection   Yes Historical Provider, MD  torsemide (DEMADEX) 20 MG tablet Take 20 mg by mouth daily.   Yes Historical Provider, MD  Travoprost, BAK Free, (TRAVATAN) 0.004 % SOLN ophthalmic solution Place 1 drop into both eyes daily at 8 pm.    Yes Historical Provider, MD  vitamin B-12 (CYANOCOBALAMIN) 500 MCG tablet Take 500 mcg by mouth every morning.    Yes Historical Provider, MD  benzocaine-menthol (CHLORASEPTIC) 6-10 MG lozenge Take 1 lozenge by mouth as  needed for sore throat.    Historical Provider, MD  ondansetron (ZOFRAN) 8 MG tablet Take 8 mg by mouth every 8 (eight) hours as needed for nausea or vomiting.    Historical Provider, MD  sodium chloride (OCEAN) 0.65 % SOLN nasal spray Place 1 spray into both nostrils as directed. Four times daily And every 24 hours as needed to moisturize nasal passages.    Historical Provider, MD    Family History Family History  Problem Relation Age of Onset  . Cancer    . Heart disease      Social History Social History  Substance Use Topics  . Smoking status: Former Smoker    Quit date: 10/09/1964  . Smokeless tobacco: Never Used  . Alcohol use No     Allergies   Aspirin   Review of Systems Review of Systems  All other systems reviewed and are negative.    Physical Exam Updated Vital Signs BP 131/57 (BP Location: Right Arm)   Pulse 89   Temp 98.9 F (37.2 C) (Oral)   Resp 18   SpO2 99% Comment: RA  Physical Exam  Constitutional: She is oriented to person, place, and time. She appears well-developed and well-nourished.  HENT:  Head: Normocephalic and atraumatic.  Cardiovascular: Normal rate and regular rhythm.   No murmur heard. Pulmonary/Chest: Effort normal and breath sounds normal. No respiratory distress.  Abdominal: There is no rebound and no guarding.  Distended abdomen with mild diffuse tenderness. Tinkling bowel sounds.  Musculoskeletal: She exhibits no edema or tenderness.  Neurological: She is alert and oriented to person, place, and time.  Skin: Skin is warm and dry.  Psychiatric: She has a normal mood and affect. Her behavior is normal.  Nursing note and vitals reviewed.    ED Treatments / Results  Labs (all labs ordered are listed, but only abnormal results are displayed) Labs Reviewed  COMPREHENSIVE METABOLIC PANEL - Abnormal; Notable for the following:       Result Value   Glucose, Bld 178 (*)    Creatinine, Ser 1.22 (*)    ALT 12 (*)    GFR calc non  Af Amer 38 (*)    GFR calc Af Amer 44 (*)    All other components within normal limits  CBC WITH DIFFERENTIAL/PLATELET -  Abnormal; Notable for the following:    RBC 3.31 (*)    Hemoglobin 11.1 (*)    HCT 33.3 (*)    MCV 100.6 (*)    Platelets 119 (*)    All other components within normal limits  COMPREHENSIVE METABOLIC PANEL - Abnormal; Notable for the following:    Potassium 2.9 (*)    Chloride 112 (*)    Glucose, Bld 124 (*)    Creatinine, Ser 1.20 (*)    Calcium 8.5 (*)    Total Protein 5.8 (*)    Albumin 3.2 (*)    ALT 10 (*)    GFR calc non Af Amer 39 (*)    GFR calc Af Amer 45 (*)    All other components within normal limits  CBC - Abnormal; Notable for the following:    RBC 2.85 (*)    Hemoglobin 9.3 (*)    HCT 28.8 (*)    MCV 101.1 (*)    Platelets 93 (*)    All other components within normal limits  GLUCOSE, CAPILLARY - Abnormal; Notable for the following:    Glucose-Capillary 137 (*)    All other components within normal limits  GLUCOSE, CAPILLARY - Abnormal; Notable for the following:    Glucose-Capillary 126 (*)    All other components within normal limits  GLUCOSE, CAPILLARY - Abnormal; Notable for the following:    Glucose-Capillary 110 (*)    All other components within normal limits  MRSA PCR SCREENING  MAGNESIUM  PHOSPHORUS  TSH  URINALYSIS, ROUTINE W REFLEX MICROSCOPIC (NOT AT [facility])  HEMOGLOBIN A1C  I-STAT TROPOININ, ED  I-STAT CG4 LACTIC ACID, ED    EKG  EKG Interpretation None       Radiology No results found.  Procedures Procedures (including critical care time)  Medications Ordered in ED Medications - No data to display   Initial Impression / Assessment and Plan / ED Course  I have reviewed the triage vital signs and the nursing notes.  Pertinent labs & imaging results that were available during my care of the patient were reviewed by me and considered in my medical decision making (see chart for details).  Clinical Course       Patient with history of sigmoid volvulus seizure with symptoms of recurrent volvulus. She is distended and tender on exam. On call physician for [facility] consulted  regarding volvulus and will evaluate the patient in the emergency department for potential sigmoidoscopy. Hospitalist consulted for admission. Patient updated of findings and studies and recommendation for further treatment and she is in agreement with plan.   Final Clinical Impressions(s) / ED Diagnoses   Final diagnoses:  Volvulus of sigmoid colon ([place])  Volvulus ([place])    New Prescriptions New Prescriptions   No medications on file     Quintella Reichert, MD 04/18/16 1252

## 2016-04-17 NOTE — H&P (Signed)
Maria Christian and Queen:[id number] DOB: 11-27-1924 DOA: 04/17/2016     PCP: [name], DO   Outpatient Specialists: Dr. Deatra Ina Patient coming from: From facility [facility]  Chief Complaint: nausea vomiting   HPI: Maria Christian is a 82 y.o. female with medical history significant of 82 y.o. female SNF resident, bed to wheelchair bound,  dementia, Parkinson's disease, atrial fibrillation not on anticoagulation, HTN, diastolic CHF, CAD, GERD, CKD, DM2,  recurrent volvulus       Presented with 2 days hx of abdominal pain Nausea and vomiting. No Diarrhea.She felt similar to prior episode of volvulus, no fever or chills. She was seen by MD at [facility] and was sent to ER.   Examination has been bedridden since her hip fracture. Case has been discussed and due to his with family who at this point agreeable to procedure. Family does understand severity of patient's condition. And possibility of recurrence Regarding pertinent Chronic problems: Patient was admitted in July 2017 for sigmoid volvulus    underwent decompression by flexible sigmoidoscopy on 7/17. Patient tolerated procedure well and was able to be discharged back to nursing home facility general surgery has been consulted to that time patient felt to be not a candidate for surgical involvement because of long-standing deconditioning multiple medical issues Case has been discussed with GI Dr. Fuller Plan who has performed flexible sigmoidoscopy sigmoidoscopy in emergency department with successful decompression patient is being admitted for further  monitoring  IN ER:  Temp (24hrs), Avg:98.5 F (36.9 C), Min:98 F (36.7 C), Max:98.9 F (37.2 C)    RR 16 HR 84 BP 131/58 Na 141  K 37  BUN 19 Cr 1.22 WBC5.0,  Hg 11.1 CT scan consistent of sigmoid volvulus Following Medications were ordered in ER: Medications  0.9 %  sodium chloride infusion ( Intravenous New Bag/Given 04/17/16 1849)     ER provider discussed case with: [facility]    Hospitalist was called for admission for sigmoid volvulus  Review of Systems:    Pertinent positives include: abdominal pain, nausea, vomiting, constipation  Constitutional:  No weight loss, night sweats, Fevers, chills, fatigue, weight loss  HEENT:  No headaches, Difficulty swallowing,Tooth/dental problems,Sore throat,  No sneezing, itching, ear ache, nasal congestion, post nasal drip,  Cardio-vascular:  No chest pain, Orthopnea, PND, anasarca, dizziness, palpitations.no Bilateral lower extremity swelling  GI:  No heartburn, indigestion,  diarrhea, change in bowel habits, loss of appetite, melena, blood in stool, hematemesis Resp:  no shortness of breath at rest. No dyspnea on exertion, No excess mucus, no productive cough, No non-productive cough, No coughing up of blood.No change in color of mucus.No wheezing. Skin:  no rash or lesions. No jaundice GU:  no dysuria, change in color of urine, no urgency or frequency. No straining to urinate.  No flank pain.  Musculoskeletal:  No joint pain or no joint swelling. No decreased range of motion. No back pain.  Psych:  No change in mood or affect. No depression or anxiety. No memory loss.  Neuro: no localizing neurological complaints, no tingling, no weakness, no double vision, no gait abnormality, no slurred speech, no confusion  As per HPI otherwise 10 point review of systems negative.   Past Medical History: Past Medical History:  Diagnosis Date  . Altered mental status   . Anemia   . Atrial fibrillation ([place])   . Constipation 02/27/2015  . Coronary artery disease   . DEMENTIA   . Depression   . Diabetes mellitus   .  Edema of lower extremity 07/13/11   right leg more swollen than left leg  . Encephalopathy   . Enlarged heart   . Esophageal dysmotility 07/02/12  . GERD (gastroesophageal reflux disease)   . History of adenomatous polyp of colon 06/24/99  . Horseshoe kidney   . Hyperlipemia   . Hypertension   . Pancreatic  lesion 05/22/11   no further workup  per PCP/family due to age  . Parkinson disease ([place])   . Peripheral neuropathy ([place])   . Sigmoid volvulus ([place]) 05/24/2013  . Thrombophlebitis   . TIA (transient ischemic attack) 12/19/2012   Past Surgical History:  Procedure Laterality Date  . ABDOMINAL HYSTERECTOMY    . BREAST SURGERY     2 benign tumors removed left breast  . CHOLECYSTECTOMY    . ESOPHAGEAL DILATION     several times by Dr. [name]  . ESOPHAGOGASTRODUODENOSCOPY (EGD) WITH ESOPHAGEAL DILATION N/A 08/01/2012   Procedure: ESOPHAGOGASTRODUODENOSCOPY (EGD) WITH ESOPHAGEAL DILATION;  Surgeon: [name], MD;  Location: [facility];  Service: Endoscopy;  Laterality: N/A;  with c-arm savory dilators  . ESOPHAGOGASTRODUODENOSCOPY (EGD) WITH PROPOFOL N/A 03/12/2015   Procedure: ESOPHAGOGASTRODUODENOSCOPY (EGD) WITH PROPOFOL;  Surgeon: [name], MD;  Location: [facility];  Service: Endoscopy;  Laterality: N/A;  . FLEXIBLE SIGMOIDOSCOPY N/A 05/24/2013   Procedure: FLEXIBLE SIGMOIDOSCOPY;  Surgeon: [name], MD;  Location: [facility];  Service: Endoscopy;  Laterality: N/A;  . FLEXIBLE SIGMOIDOSCOPY N/A 05/26/2013   Procedure:  flex with decompression of sigmoid volvulus;  Surgeon: [name], MD;  Location: [facility];  Service: Endoscopy;  Laterality: N/A;  . FLEXIBLE SIGMOIDOSCOPY N/A 12/20/2015   Procedure: FLEXIBLE SIGMOIDOSCOPY;  Surgeon: Milus Banister, MD;  Location: [facility];  Service: Endoscopy;  Laterality: N/A;  . FOOT SURGERY     benign tumors from foot  . NOSE SURGERY    . SHOULDER SURGERY  2001   left clavicle excision and acromioplasty  . TOTAL HIP ARTHROPLASTY       Social History:  Ambulatory   wheelchair bound, bed bound    reports that she quit smoking about 51 years ago. She has never used smokeless tobacco. She reports that she does not drink alcohol or use drugs.  Allergies:   Allergies  Allergen Reactions  . Aspirin Other  (See Comments)    G.I. Upset only       Family History:   Family History  Problem Relation Age of Onset  . Cancer    . Heart disease      Medications: Prior to Admission medications   Medication Sig Start Date End Date Taking? Authorizing Provider  amLODipine (NORVASC) 2.5 MG tablet Take 2.5 mg by mouth every morning. Hold for BP < 100/60 or HR < 60   Yes Historical Provider, MD  aspirin EC 81 MG tablet Take 81 mg by mouth daily.   Yes Historical Provider, MD  baclofen (LIORESAL) 10 MG tablet Take 5 mg by mouth at bedtime.   Yes Historical Provider, MD  Biotin 5 MG/ML LIQD Take 5 mLs by mouth 2 (two) times daily.    Yes Historical Provider, MD  carvedilol (COREG) 3.125 MG tablet Take 3.125 mg by mouth daily.   Yes Historical Provider, MD  cholecalciferol (VITAMIN D) 1000 UNITS tablet Take 1,000 Units by mouth every morning.    Yes Historical Provider, MD  Cranberry 475 MG CAPS Take 1 capsule by mouth 2 (two) times daily.    Yes Historical  Provider, MD  docusate sodium (COLACE) 100 MG capsule Take 200 mg by mouth at bedtime.    Yes Historical Provider, MD  dorzolamide (TRUSOPT) 2 % ophthalmic solution Place 1 drop into both eyes 3 (three) times daily. Reported on 11/16/2015        0800,1400,2000   Yes Historical Provider, MD  escitalopram (LEXAPRO) 10 MG tablet Take 1 tablet (10 mg total) by mouth daily. 12/03/14  Yes Gerlene Fee, NP  ferrous sulfate 325 (65 FE) MG tablet Take 325 mg by mouth daily with breakfast.   Yes Historical Provider, MD  fluticasone (FLONASE) 50 MCG/ACT nasal spray Place 2 sprays into both nostrils at bedtime.   Yes Historical Provider, MD  ipratropium-albuterol (DUONEB) 0.5-2.5 (3) MG/3ML SOLN Take 3 mLs by nebulization daily as needed (wheezing).   Yes Historical Provider, MD  lidocaine (LIDODERM) 5 % Place 1 patch onto the skin daily. Apply 1 patch to left lower back/hip every morning.  Remove & Discard patch within 12 hours or as directed by MD 07/28/13  Yes  Thurnell Lose, MD  Linaclotide (LINZESS) 145 MCG CAPS capsule Take 145 mcg by mouth daily before breakfast.    Yes Historical Provider, MD  loratadine (CLARITIN) 10 MG tablet Take 10 mg by mouth daily before breakfast.    Yes Historical Provider, MD  Multiple Vitamins-Minerals (CERTAGEN PO) Take 1 tablet by mouth daily.    Yes Historical Provider, MD  OXYGEN Inhale 2 L into the lungs continuous.   Yes Historical Provider, MD  polyethylene glycol (MIRALAX / GLYCOLAX) packet Take 17 g by mouth daily at 6 (six) AM.    Yes Historical Provider, MD  potassium chloride SA (K-DUR,KLOR-CON) 20 MEQ tablet Take 20 mEq by mouth daily.  10/23/13  Yes Janece Canterbury, MD  PROTONIX 40 MG PACK Take 40 mg by mouth daily at 6 (six) AM. 12/06/15  Yes Historical Provider, MD  senna (SENOKOT) 8.6 MG TABS tablet Take 1 tablet (8.6 mg total) by mouth 2 (two) times daily. 01/05/14  Yes Barton Dubois, MD  Skin Protectants, Misc. (CALAZIME SKIN PROTECTANT EX) Apply 1 application topically daily. Buttocks protection   Yes Historical Provider, MD  torsemide (DEMADEX) 20 MG tablet Take 20 mg by mouth daily.   Yes Historical Provider, MD  Travoprost, BAK Free, (TRAVATAN) 0.004 % SOLN ophthalmic solution Place 1 drop into both eyes daily at 8 pm.    Yes Historical Provider, MD  vitamin B-12 (CYANOCOBALAMIN) 500 MCG tablet Take 500 mcg by mouth every morning.    Yes Historical Provider, MD  benzocaine-menthol (CHLORASEPTIC) 6-10 MG lozenge Take 1 lozenge by mouth as needed for sore throat.    Historical Provider, MD  ondansetron (ZOFRAN) 8 MG tablet Take 8 mg by mouth every 8 (eight) hours as needed for nausea or vomiting.    Historical Provider, MD  sodium chloride (OCEAN) 0.65 % SOLN nasal spray Place 1 spray into both nostrils as directed. Four times daily And every 24 hours as needed to moisturize nasal passages.    Historical Provider, MD    Physical Exam: Patient Vitals for the past 24 hrs:  BP Temp Temp src Pulse Resp SpO2   04/17/16 1814 151/61 98.5 F (36.9 C) Oral 73 19 -  04/17/16 1719 131/70 - - 70 19 94 %  04/17/16 1451 131/57 98.9 F (37.2 C) Oral 89 18 99 %    1. General:  in No Acute distress   Hard of hearing 2. Psychological: Alert and  Oriented to self 3. Head/ENT:     Dry Mucous Membranes                          Head Non traumatic, neck supple                            Poor Dentition 4. SKIN:   decreased Skin turgor,  Skin clean Dry and intact no rash 5. Heart: Regular rate and rhythm no Murmur, Rub or gallop 6. Lungs:    no wheezes or crackles   7. Abdomen:   non-tender, distended with diminished bowel sounds 8. Lower extremities: no clubbing, cyanosis, or edema 9. Neurologically Grossly intact, moving all 4 extremities equally  10. MSK: Normal range of motion   body mass index is unknown because there is no height or weight on file.  Labs on Admission:   Labs on Admission: I have personally reviewed following labs and imaging studies  CBC:  Recent Labs Lab 04/17/16 1551  WBC 5.0  NEUTROABS 3.0  HGB 11.1*  HCT 33.3*  MCV 100.6*  PLT 123456*   Basic Metabolic Panel:  Recent Labs Lab 04/17/16 1551  NA 141  K 3.7  CL 109  CO2 25  GLUCOSE 178*  BUN 19  CREATININE 1.22*  CALCIUM 9.1   GFR: Estimated Creatinine Clearance: 30.4 mL/min (by C-G formula based on SCr of 1.22 mg/dL (H)). Liver Function Tests:  Recent Labs Lab 04/17/16 1551  AST 20  ALT 12*  ALKPHOS 89  BILITOT 0.8  PROT 7.2  ALBUMIN 3.8   No results for input(s): LIPASE, AMYLASE in the last 168 hours. No results for input(s): AMMONIA in the last 168 hours. Coagulation Profile: No results for input(s): INR, PROTIME in the last 168 hours. Cardiac Enzymes: No results for input(s): CKTOTAL, CKMB, CKMBINDEX, TROPONINI in the last 168 hours. BNP (last 3 results) No results for input(s): PROBNP in the last 8760 hours. HbA1C: No results for input(s): HGBA1C in the last 72 hours. CBG: No results  for input(s): GLUCAP in the last 168 hours. Lipid Profile: No results for input(s): CHOL, HDL, LDLCALC, TRIG, CHOLHDL, LDLDIRECT in the last 72 hours. Thyroid Function Tests: No results for input(s): TSH, T4TOTAL, FREET4, T3FREE, THYROIDAB in the last 72 hours. Anemia Panel: No results for input(s): VITAMINB12, FOLATE, FERRITIN, TIBC, IRON, RETICCTPCT in the last 72 hours.  Sepsis Labs: @LABRCNTIP (procalcitonin:4,lacticidven:4) )No results found for this or any previous visit (from the past 240 hour(s)).     UA  ordered  Lab Results  Component Value Date   HGBA1C 6.1 (A) 07/17/2014    Estimated Creatinine Clearance: 30.4 mL/min (by C-G formula based on SCr of 1.22 mg/dL (H)).  BNP (last 3 results) No results for input(s): PROBNP in the last 8760 hours.   ECG REPORT  Independently reviewed Rate:63  Rhythm: Atrial fibrillation ST&T Change: No acute ischemic changes  QTC 443  There were no vitals filed for this visit.   Cultures:    Component Value Date/Time   SDES URINE, CATHETERIZED 03/06/2016 0409   SPECREQUEST NONE 03/06/2016 0409   CULT 80,000 COLONIES/mL YEAST (A) 03/06/2016 0409   REPTSTATUS 03/07/2016 FINAL 03/06/2016 0409     Radiological Exams on Admission: Ct Abdomen Pelvis Wo Contrast  Result Date: 04/17/2016 CLINICAL DATA:  Abdominal pain for 3 days, abdominal distention, absent bowel sounds. Bowel movement yesterday. History of small bowel obstruction and volvulus.  EXAM: CT ABDOMEN AND PELVIS WITHOUT CONTRAST TECHNIQUE: Multidetector CT imaging of the abdomen and pelvis was performed following the standard protocol without IV contrast. COMPARISON:  CT abdomen and pelvis March 06, 2016 FINDINGS: LOWER CHEST: Similar bronchial wall thickening. The heart is enlarged with small pericardial effusion. Severe coronary artery and mitral annular calcifications. HEPATOBILIARY: The liver is normal, status post cholecystectomy. PANCREAS: Atrophic pancreas, otherwise  unremarkable. SPLEEN: Normal. ADRENALS/URINARY TRACT: Horseshoe kidney. 2 mm LEFT lower pole nephrolithiasis without hydronephrosis. Limited assessment for renal mass by noncontrast CT. Urinary bladder is well distended, unremarkable. Normal adrenal glands. STOMACH/BOWEL: Sigmoid volvulus with swirled mesentery, proximal sigmoid and gas distended, 8.8 cm in transaxial dimension. Rectal air-fluid level. Small air-filled duodenum diverticulum. VASCULAR/LYMPHATIC: Aortoiliac vessels are normal in course and caliber, severe calcific atherosclerosis. No lymphadenopathy by CT size criteria. REPRODUCTIVE: Normal. OTHER: No intraperitoneal free fluid or free air. MUSCULOSKELETAL: Non-acute. Status post LEFT hip total arthroplasty. Osteopenia. Multilevel severe facet arthropathy. Anterior pelvic wall scarring. IMPRESSION: Sigmoid volvulus. 2 mm nonobstructing LEFT nephrolithiasis.  Horseshoe kidney. Severe atherosclerosis. Acute findings discussed with and reconfirmed by [facility] on 04/17/2016 at 5:28 pm. Electronically Signed   By: Elon Alas M.D.   On: 04/17/2016 17:28    Chart has been reviewed    Assessment/Plan   82 y.o. female with medical history significant of 82 y.o. female SNF resident, bed to wheelchair bound,  dementia, Parkinson's disease, atrial fibrillation not on anticoagulation, HTN, diastolic CHF, CAD, GERD, CKD, DM2,  recurrent volvulus     Present on Admission: . Sigmoid volvulus ([place]) - appreciate GI consult status post sigmoidoscopy. In July patient has been evaluated by surgery and felt was not a surgical candidate. Patient's family at this point did not show interest in surgical intervention will continue with conservative management will repeat KUB in the morning. Restart bowel regimen . Anemia of chronic disease stable continue to monitor    . Chronic atrial fibrillation (HCC) restart Coreg patient not on any anticoagulation clinic to advanced age and fall risk   .  Chronic pain stable continue home medications . Chronic respiratory failure ([place]) continue home oxygen . CKD (chronic kidney disease) stage 3, GFR 30-59 ml/min continue to monitor and rehydrate monitor creatinine . Dementia without behavioral disturbance stable expect some degree of sundowning while hospitalized . Coronary artery disease stable currently no complaints continue home medications . Diastolic CHF, chronic (HCC) given dehydration secondary to nausea vomiting hold Lasix give gentle fluids    Other plan as per orders.  DVT prophylaxis:  SCD      Code Status:    DNR/DNI   as per family   Family Communication:   Family   at  Bedside  plan of care was discussed with   Daughter    Disposition Plan:                              Back to current facility when stable                                                     Consults called: [facility]  Dr. Fuller Plan  Admission status:  inpatient      Level of care       medical floor  I have spent a total of 49min on this admission   extra time was spent to discuss case with [facility]  Maria Christian 04/17/2016, 11:11 PM    [facility]  Pager [phone]   after 2 AM please page floor coverage PA If 7AM-7PM, please contact the day team taking care of the patient  [email or web address]  Password [email or web address]

## 2016-04-17 NOTE — ED Notes (Signed)
Procedural team at bedside.

## 2016-04-17 NOTE — ED Notes (Signed)
Bed: WA01 Expected date:  Expected time:  Means of arrival:  Comments: EMS- 82yo F, generalized abdominal pain/Hx of SBO

## 2016-04-17 NOTE — Op Note (Signed)
[facility] Patient Name: Maria Christian Procedure Date: 04/17/2016 MRN: [id number] Attending MD: Ladene Artist , MD Date of Birth: [birth year] CSN: [id number] Age: 82 Admit Type: Inpatient Procedure:                Flexible Sigmoidoscopy Indications:              Recurrent sigmoid volvulus, Abnormal CT of the colon Providers:                Pricilla Riffle. Fuller Plan, MD, Laverta Baltimore RN, RN,                            Cherylynn Ridges, Technician Referring MD:             Quintella Reichert, MD Medicines:                Fentanyl 25 micrograms IV, Midazolam 2 mg IV Complications:            No immediate complications. Estimated blood loss:                            None. Estimated Blood Loss:     Estimated blood loss: none. Procedure:                Pre-Anesthesia Assessment:                           - Prior to the procedure, a History and Physical                            was performed, and patient medications and                            allergies were reviewed. The patient's tolerance of                            previous anesthesia was also reviewed. The risks                            and benefits of the procedure and the sedation                            options and risks were discussed with the patient.                            All questions were answered, and informed consent                            was obtained. Prior Anticoagulants: The patient has                            taken no previous anticoagulant or antiplatelet                            agents. ASA Grade Assessment: III - A patient with  severe systemic disease. After reviewing the risks                            and benefits, the patient was deemed in                            satisfactory condition to undergo the procedure.                           After obtaining informed consent, the scope was                            passed under direct vision. The [id number]  [id number])                            scope was introduced through the anus, and advanced                            to the the descending colon. The flexible                            sigmoidoscopy was accomplished without difficulty                            using minimal CO2 insufflation and H20 irrigation.                            The patient tolerated the procedure well. The                            quality of the bowel preparation was inadequate,                            unprepped exam. Scope In: Scope Out: Findings:      The perianal and digital rectal examinations were normal.      A volvulus, with viable appearing mucosa, was found in the sigmoid       colon. The volvulized segment was easily traversed. Decompression of the       volvulus was attempted and was successful, with complete decompression       achieved.      The lumen of the sigmoid colon, proximal sigmoid colon and descending       colon was grossly dilated. Decompression was attempted and was       successful, with complete decompression achieved.      The exam was otherwise without abnormality. Impression:               - Preparation of the colon was inadequate.                           - Volvulus. Successful complete decompression                            achieved.                           -  Dilated in the sigmoid colon, in the proximal                            sigmoid colon and in the descending colon.                            Successful complete decompression achieved.                           - The examination was otherwise normal.                           - No specimens collected. Moderate Sedation:      Moderate (conscious) sedation was administered by the endoscopy nurse       and supervised by the endoscopist. The following parameters were       monitored: oxygen saturation, heart rate, blood pressure, and response       to care. Total physician intraservice time was 9  minutes. Recommendation:           - Observe the patient for in hospital.                           - Dr. Hilarie Fredrickson to resume GI care in the morning. Procedure Code(s):        --- Professional ---                           [phone], Sigmoidoscopy, flexible; with decompression                            (for pathologic distention) (eg, volvulus,                            megacolon), including placement of decompression                            tube, when performed Diagnosis Code(s):        --- Professional ---                           K56.2, Volvulus                           K59.39, Other megacolon                           R93.3, Abnormal findings on diagnostic imaging of                            other parts of digestive tract CPT copyright 2016 [employer]. All rights reserved. The codes documented in this report are preliminary and upon coder review may  be revised to meet current compliance requirements. Ladene Artist, MD 04/17/2016 7:55:10 PM This report has been signed electronically. Number of Addenda: 0

## 2016-04-17 NOTE — ED Notes (Signed)
Patient is being transported to [place] for endoscopy. Will give report to [place], Therapist, sports.

## 2016-04-17 NOTE — Progress Notes (Signed)
Patient ID: Maria Christian, female   DOB: August 16, 1924, 82 y.o.   MRN: [id number]    DATE:  04/17/2016  Location:    [place] Room Number: 073 A Place of Service: SNF (31)   Extended Emergency Contact Information Primary Emergency Contact: [name] [place] Phone: [phone] Relation: Son Secondary Emergency Contact: Roberts,Lynn Address: [address]          [place], [place] [address] [place] Phone: [phone] Mobile Phone: [phone] Relation: Daughter  Advanced Directive information Does patient have an advance directive?: Yes, Type of Advance Directive: Out of facility DNR (pink MOST or yellow form), Does patient want to make changes to advanced directive?: No - Patient declined  Chief Complaint  Patient presents with  . Acute Visit    nausea and abdominal pain "just don't feel right"    HPI:  82 yo female long term resident seen today for abdominal pain, N/V x 2 days. She reports poor po intake due to sx's. She has been having normal BMs and no hard stool or watery stool. She started vomiting this AM. No f/c. No bloody stools or emesis.  She has a hx volvulus and would like further eval in hospital. She has had flex sigmoidoscopy in past. She was told she is not a surgical candidate  Hypertension - BP stable on norvasc 2.5 mg daily; coreg 3.125 mg daily   Hx Chronic diastolic heart failure - stable on torsemide. Recent CXR revealed mild pulmonary edema  Hx Duodenal ulcer - stable on protonix 40 mg  daily    Recurrent UTI - takes cranberry tabs daily for prevention.     Chronic pain due to osteoarthritis - stable on lidoderm to lower back daily;  baclofen 5 mg nightly for leg spasticity  Chronic constipation - chronic sigmoid volvulus. Takes colace 200 mg daily; linzess 145 mcg daily; senna twice daily; miralax daily   CKD - Cr 1.3 s/o indicative of stage 3 disease    Anemia of chronic disease - stable  on iron daily. Hgb 11.5  Chronic afib - rate controlled on coreg 3.125 mg daily. She takes ASA daily for anticoagulaiton.    Chronic respiratory failure - O2 dependent. stable   Allergic rhinitis - stable on flonase nightly and claritin 10 mg daily  Chronic pain due to osteoarthritis - stable on lidoderm to lower back daily; baclofen 5 mg nightly for leg spasticity    Depression - mood stable on lexapro 10 mg daily    Glaucoma - uses trusopt to both eyes three times daily; travatan to both eyes nightly   Low back pain with radiculopathy - currently on neurontin 100 mg nightly for neuropathy   Dementia - weight has improved by 7 lbs since last month. Appetite somewhat poor. Last albumin 3.3. She gets 2cal nutritional supplements. She takes vitamins and minerals. She does not have any medicine for cognitive impairment at this time   Past Medical History:  Diagnosis Date  . Altered mental status   . Anemia   . Atrial fibrillation ([place])   . Constipation 02/27/2015  . Coronary artery disease   . DEMENTIA   . Depression   . Diabetes mellitus   . Edema of lower extremity 07/13/11   right leg more swollen than left leg  . Encephalopathy   . Enlarged heart   . Esophageal dysmotility 07/02/12  . GERD (gastroesophageal reflux disease)   . History of adenomatous polyp of  colon 06/24/99  . Horseshoe kidney   . Hyperlipemia   . Hypertension   . Pancreatic lesion 05/22/11   no further workup  per PCP/family due to age  . Parkinson disease ([place])   . Peripheral neuropathy ([place])   . Sigmoid volvulus ([place]) 05/24/2013  . Thrombophlebitis   . TIA (transient ischemic attack) 12/19/2012    Past Surgical History:  Procedure Laterality Date  . ABDOMINAL HYSTERECTOMY    . BREAST SURGERY     2 benign tumors removed left breast  . CHOLECYSTECTOMY    . ESOPHAGEAL DILATION     several times by Dr. [name]  . ESOPHAGOGASTRODUODENOSCOPY (EGD) WITH ESOPHAGEAL DILATION N/A 08/01/2012   Procedure:  ESOPHAGOGASTRODUODENOSCOPY (EGD) WITH ESOPHAGEAL DILATION;  Surgeon: [name], MD;  Location: [facility];  Service: Endoscopy;  Laterality: N/A;  with c-arm savory dilators  . ESOPHAGOGASTRODUODENOSCOPY (EGD) WITH PROPOFOL N/A 03/12/2015   Procedure: ESOPHAGOGASTRODUODENOSCOPY (EGD) WITH PROPOFOL;  Surgeon: [name], MD;  Location: [facility];  Service: Endoscopy;  Laterality: N/A;  . FLEXIBLE SIGMOIDOSCOPY N/A 05/24/2013   Procedure: FLEXIBLE SIGMOIDOSCOPY;  Surgeon: [name], MD;  Location: [facility];  Service: Endoscopy;  Laterality: N/A;  . FLEXIBLE SIGMOIDOSCOPY N/A 05/26/2013   Procedure:  flex with decompression of sigmoid volvulus;  Surgeon: [name], MD;  Location: [facility];  Service: Endoscopy;  Laterality: N/A;  . FLEXIBLE SIGMOIDOSCOPY N/A 12/20/2015   Procedure: FLEXIBLE SIGMOIDOSCOPY;  Surgeon: Milus Banister, MD;  Location: [facility];  Service: Endoscopy;  Laterality: N/A;  . FOOT SURGERY     benign tumors from foot  . NOSE SURGERY    . SHOULDER SURGERY  2001   left clavicle excision and acromioplasty  . TOTAL HIP ARTHROPLASTY      Patient Care Team: [name], DO as PCP - General (Internal Medicine) Gerlene Fee, NP as Nurse Practitioner (Nurse Practitioner)  Social History   Social History  . Marital status: Widowed    Spouse name: N/A  . Number of children: 5  . Years of education: 10th   Occupational History  . Retired    Social History Main Topics  . Smoking status: Former Smoker    Quit date: 10/09/1964  . Smokeless tobacco: Never Used  . Alcohol use No  . Drug use: No  . Sexual activity: No   Other Topics Concern  . Not on file   Social History Narrative   Pt lives at [facility] and [place].   Caffeine Use: very small amount daily     reports that she quit smoking about 51 years ago. She has never used smokeless tobacco. She reports that she does not drink alcohol or use drugs.  Family History    Problem Relation Age of Onset  . Cancer    . Heart disease     Family Status  Relation Status  . Mother Deceased at age 51   blood clots  . Father Deceased at age 27   cirrohis of the liver  .      Immunization History  Administered Date(s) Administered  . Influenza-Unspecified 07/13/2015, 03/09/2016  . PPD Test 07/28/2013, 02/18/2016, 02/25/2016    Allergies  Allergen Reactions  . Aspirin Other (See Comments)    G.I. Upset only    Medications: Patient's Medications  New Prescriptions   No medications on file  Previous Medications   AMLODIPINE (NORVASC) 2.5 MG TABLET    Take 2.5 mg by mouth every morning. Hold  for BP < 100/60 or HR < 60   ASPIRIN EC 81 MG TABLET    Take 81 mg by mouth daily.   BACLOFEN (LIORESAL) 10 MG TABLET    Take 5 mg by mouth at bedtime.   BENZOCAINE-MENTHOL (CHLORASEPTIC) 6-10 MG LOZENGE    Take 1 lozenge by mouth as needed for sore throat.   BIOTIN 5 MG/ML LIQD    Take 5 mLs by mouth 2 (two) times daily.    CARVEDILOL (COREG) 3.125 MG TABLET    Take 3.125 mg by mouth daily.   CHOLECALCIFEROL (VITAMIN D) 1000 UNITS TABLET    Take 1,000 Units by mouth every morning.    CRANBERRY 475 MG CAPS    Take 1 capsule by mouth 2 (two) times daily.    DOCUSATE SODIUM (COLACE) 100 MG CAPSULE    Take 200 mg by mouth at bedtime.    DORZOLAMIDE (TRUSOPT) 2 % OPHTHALMIC SOLUTION    Place 1 drop into both eyes 3 (three) times daily. Reported on 11/16/2015        0800,1400,2000   ESCITALOPRAM (LEXAPRO) 10 MG TABLET    Take 1 tablet (10 mg total) by mouth daily.   FERROUS SULFATE 325 (65 FE) MG TABLET    Take 325 mg by mouth daily with breakfast.   FLUTICASONE (FLONASE) 50 MCG/ACT NASAL SPRAY    Place 2 sprays into both nostrils at bedtime.   GABAPENTIN (NEURONTIN) 100 MG CAPSULE    Take 200 mg by mouth at bedtime.    LIDOCAINE (LIDODERM) 5 %    Place 1 patch onto the skin daily. Apply 1 patch to left lower back/hip every morning.  Remove & Discard patch within 12  hours or as directed by MD   LINACLOTIDE (LINZESS) 145 MCG CAPS CAPSULE    Take 145 mcg by mouth daily before breakfast.    LORATADINE (CLARITIN) 10 MG TABLET    Take 10 mg by mouth daily before breakfast.    MULTIPLE VITAMINS-MINERALS (CERTAGEN PO)    Take 1 tablet by mouth daily.    ONDANSETRON (ZOFRAN) 8 MG TABLET    Take 8 mg by mouth every 8 (eight) hours as needed for nausea or vomiting.   OXYGEN    Inhale 2 L into the lungs continuous.   POLYETHYLENE GLYCOL (MIRALAX / GLYCOLAX) PACKET    Take 17 g by mouth daily at 6 (six) AM.    POTASSIUM CHLORIDE SA (K-DUR,KLOR-CON) 20 MEQ TABLET    Take 20 mEq by mouth daily.    PROTONIX 40 MG PACK    Take 40 mg by mouth daily at 6 (six) AM.   SENNA (SENOKOT) 8.6 MG TABS TABLET    Take 1 tablet (8.6 mg total) by mouth 2 (two) times daily.   SODIUM CHLORIDE (OCEAN) 0.65 % SOLN NASAL SPRAY    Place 1 spray into both nostrils as directed. Four times daily And every 24 hours as needed to moisturize nasal passages.   TORSEMIDE (DEMADEX) 20 MG TABLET    Take 20 mg by mouth daily.   TRAVOPROST, BAK FREE, (TRAVATAN) 0.004 % SOLN OPHTHALMIC SOLUTION    Place 1 drop into both eyes daily at 8 pm.    VITAMIN B-12 (CYANOCOBALAMIN) 500 MCG TABLET    Take 500 mcg by mouth every morning.   Modified Medications   No medications on file  Discontinued Medications   ACETAMINOPHEN (TYLENOL) 325 MG TABLET    Take 2 tablets (650 mg total) by mouth  every 6 (six) hours as needed for mild pain.   IPRATROPIUM-ALBUTEROL (DUONEB) 0.5-2.5 (3) MG/3ML SOLN    Take 3 mLs by nebulization.    Review of Systems  Unable to perform ROS: Dementia    Vitals:   04/17/16 1146  BP: 132/70  Pulse: 71  Resp: 17  Temp: 98 F (36.7 C)  TempSrc: Oral  SpO2: 97%  Weight: 151 lb 3.2 oz (68.6 kg)  Height: 5' 7"  (1.702 m)   Body mass index is 23.68 kg/m.  Physical Exam  Constitutional: She appears well-developed.  Frail appearing, lying in bed in NAD, looks ill  HENT:   Mouth/Throat: Oropharynx is clear and moist. No oropharyngeal exudate.  MMM  Eyes: Pupils are equal, round, and reactive to light. No scleral icterus.  Neck: Neck supple. Carotid bruit is not present. No tracheal deviation present.  Cardiovascular: Normal rate, regular rhythm and intact distal pulses.   Occasional extrasystoles are present. Exam reveals no gallop and no friction rub.   Murmur (1/6 SEM) heard. No LE edema b/l. No calf TTP  Pulmonary/Chest: Effort normal. No stridor. No respiratory distress. She has no wheezes. She has no rales.  Abdominal: Soft. She exhibits distension (hard). She exhibits no ascites and no mass. Bowel sounds are increased. There is no hepatosplenomegaly or hepatomegaly. There is generalized tenderness. There is no rigidity, no rebound and no guarding. No hernia. Hernia confirmed negative in the ventral area.  (+) tympany to percussion  Musculoskeletal: She exhibits edema.  Lymphadenopathy:    She has no cervical adenopathy.  Neurological: She is alert.  Skin: Skin is warm and dry. No rash noted.  Psychiatric: She has a normal mood and affect. Her behavior is normal.     Labs reviewed: Nursing Home on 04/17/2016  Component Date Value Ref Range Status  . Glucose 04/07/2016 140  mg/dL Final  . BUN 04/07/2016 18  4 - 21 mg/dL Final  . Creatinine 04/07/2016 1.4* 0.5 - 1.1 mg/dL Final  . Potassium 04/07/2016 4.1  3.4 - 5.3 mmol/L Final  . Sodium 04/07/2016 143  137 - 147 mmol/L Final  Nursing Home on 03/21/2016  Component Date Value Ref Range Status  . Glucose 03/11/2016 196  mg/dL Final  . BUN 03/11/2016 18  4 - 21 mg/dL Final  . Creatinine 03/11/2016 1.3* 0.5 - 1.1 mg/dL Final  . Potassium 03/11/2016 3.7  3.4 - 5.3 mmol/L Final  . Sodium 03/11/2016 141  137 - 147 mmol/L Final  Admission on 03/05/2016, Discharged on 03/06/2016  Component Date Value Ref Range Status  . Sodium 03/06/2016 143  135 - 145 mmol/L Final  . Potassium 03/06/2016 4.2  3.5 -  5.1 mmol/L Final  . Chloride 03/06/2016 115* 101 - 111 mmol/L Final  . CO2 03/06/2016 22  22 - 32 mmol/L Final  . Glucose, Bld 03/06/2016 120* 65 - 99 mg/dL Final  . BUN 03/06/2016 23* 6 - 20 mg/dL Final  . Creatinine, Ser 03/06/2016 1.40* 0.44 - 1.00 mg/dL Final  . Calcium 03/06/2016 8.6* 8.9 - 10.3 mg/dL Final  . Total Protein 03/06/2016 6.6  6.5 - 8.1 g/dL Final  . Albumin 03/06/2016 3.3* 3.5 - 5.0 g/dL Final  . AST 03/06/2016 29  15 - 41 U/L Final  . ALT 03/06/2016 15  14 - 54 U/L Final  . Alkaline Phosphatase 03/06/2016 82  38 - 126 U/L Final  . Total Bilirubin 03/06/2016 0.8  0.3 - 1.2 mg/dL Final  . GFR calc non Af  Amer 03/06/2016 32* >60 mL/min Final  . GFR calc Af Amer 03/06/2016 37* >60 mL/min Final   Comment: (NOTE) The eGFR has been calculated using the CKD EPI equation. This calculation has not been validated in all clinical situations. eGFR's persistently <60 mL/min signify possible Chronic Kidney Disease.   . Anion gap 03/06/2016 6  5 - 15 Final  . WBC 03/06/2016 3.8* 4.0 - 10.5 K/uL Final  . RBC 03/06/2016 2.94* 3.87 - 5.11 MIL/uL Final  . Hemoglobin 03/06/2016 9.8* 12.0 - 15.0 g/dL Final  . HCT 03/06/2016 29.5* 36.0 - 46.0 % Final  . MCV 03/06/2016 100.3* 78.0 - 100.0 fL Final  . MCH 03/06/2016 33.3  26.0 - 34.0 pg Final  . MCHC 03/06/2016 33.2  30.0 - 36.0 g/dL Final  . RDW 03/06/2016 14.5  11.5 - 15.5 % Final  . Platelets 03/06/2016 106* 150 - 400 K/uL Final   Comment: SPECIMEN CHECKED FOR CLOTS REPEATED TO VERIFY PLATELET COUNT CONFIRMED BY SMEAR   . Neutrophils Relative % 03/06/2016 45  % Final  . Neutro Abs 03/06/2016 1.7  1.7 - 7.7 K/uL Final  . Lymphocytes Relative 03/06/2016 40  % Final  . Lymphs Abs 03/06/2016 1.5  0.7 - 4.0 K/uL Final  . Monocytes Relative 03/06/2016 10  % Final  . Monocytes Absolute 03/06/2016 0.4  0.1 - 1.0 K/uL Final  . Eosinophils Relative 03/06/2016 5  % Final  . Eosinophils Absolute 03/06/2016 0.2  0.0 - 0.7 K/uL Final  .  Basophils Relative 03/06/2016 0  % Final  . Basophils Absolute 03/06/2016 0.0  0.0 - 0.1 K/uL Final  . Lactic Acid, Venous 03/06/2016 1.01  0.5 - 1.9 mmol/L Final  . Troponin i, poc 03/06/2016 0.03  0.00 - 0.08 ng/mL Final  . Comment 3 03/06/2016          Final   Comment: Due to the release kinetics of cTnI, a negative result within the first hours of the onset of symptoms does not rule out myocardial infarction with certainty. If myocardial infarction is still suspected, repeat the test at appropriate intervals.   . Color, Urine 03/06/2016 YELLOW  YELLOW Final  . APPearance 03/06/2016 TURBID* CLEAR Final  . Specific Gravity, Urine 03/06/2016 1.013  1.005 - 1.030 Final  . pH 03/06/2016 5.5  5.0 - 8.0 Final  . Glucose, UA 03/06/2016 NEGATIVE  NEGATIVE mg/dL Final  . Hgb urine dipstick 03/06/2016 MODERATE* NEGATIVE Final  . Bilirubin Urine 03/06/2016 NEGATIVE  NEGATIVE Final  . Ketones, ur 03/06/2016 NEGATIVE  NEGATIVE mg/dL Final  . Protein, ur 03/06/2016 30* NEGATIVE mg/dL Final  . Nitrite 03/06/2016 NEGATIVE  NEGATIVE Final  . Leukocytes, UA 03/06/2016 LARGE* NEGATIVE Final  . Specimen Description 03/07/2016 URINE, CATHETERIZED   Final  . Special Requests 03/07/2016 NONE   Final  . Culture 03/07/2016 80,000 COLONIES/mL YEAST*  Final  . Report Status 03/07/2016 03/07/2016 FINAL   Final  . Squamous Epithelial / LPF 03/06/2016 0-5* NONE SEEN Final  . WBC, UA 03/06/2016 TOO NUMEROUS TO COUNT  0 - 5 WBC/hpf Final  . RBC / HPF 03/06/2016 0-5  0 - 5 RBC/hpf Final  . Bacteria, UA 03/06/2016 MANY* NONE SEEN Final  Nursing Home on 03/01/2016  Component Date Value Ref Range Status  . Hemoglobin 02/23/2016 9.7* 12.0 - 16.0 g/dL Final  . HCT 02/23/2016 31* 36 - 46 % Final  . Platelets 02/23/2016 126* 150 - 399 K/L Final  . WBC 02/23/2016 3.5  10^3/mL Final  .  Glucose 02/23/2016 118  mg/dL Final  . BUN 02/23/2016 26* 4 - 21 mg/dL Final  . Creatinine 02/23/2016 1.5* 0.5 - 1.1 mg/dL Final   . Potassium 02/23/2016 4.6  3.4 - 5.3 mmol/L Final  . Sodium 02/23/2016 144  137 - 147 mmol/L Final  Nursing Home on 02/22/2016  Component Date Value Ref Range Status  . Hemoglobin 02/15/2016 11.3* 12.0 - 16.0 g/dL Final  . HCT 02/15/2016 36  36 - 46 % Final  . Platelets 02/15/2016 128* 150 - 399 K/L Final  . WBC 02/15/2016 4.6  10^3/mL Final  . Glucose 02/15/2016 160  mg/dL Final  . BUN 02/15/2016 19  4 - 21 mg/dL Final  . Creatinine 02/15/2016 1.5* 0.5 - 1.1 mg/dL Final  . Potassium 02/15/2016 4.5  3.4 - 5.3 mmol/L Final  . Sodium 02/15/2016 143  137 - 147 mmol/L Final  . Alkaline Phosphatase 02/15/2016 107  25 - 125 U/L Final  . ALT 02/15/2016 11  7 - 35 U/L Final  . AST 02/15/2016 20  13 - 35 U/L Final  . Bilirubin, Total 02/15/2016 0.4  mg/dL Final    No results found.   Assessment/Plan   ICD-9-CM ICD-10-CM   1. Generalized abdominal pain 789.07 R10.84   2. Sigmoid volvulus (HCC) 560.2 K56.2   3. Nausea and vomiting, intractability of vomiting not specified, unspecified vomiting type 787.01 R11.2   4. Slow transit constipation 564.01 K59.01   5. Gastroesophageal reflux disease without esophagitis 530.81 K21.9   6. Dementia without behavioral disturbance, unspecified dementia type 294.20 F03.90      Worsening abdominal pain with N/V concerning for worsening volvulus and/or obstruction. Send to ED for further eval and mx  Will follow once d/cd    [facility] S. Perlie Gold  [facility] [address] [place], [place] [address] [phone] Cell (Monday-Friday 8 AM - 5 PM) [phone] After 5 PM and follow prompts

## 2016-04-17 NOTE — ED Triage Notes (Signed)
Per EMS report: Pt coming [place] home and c/o of abd pain since Saturday.  Hx of SBO. EMS noted pt's distended abd which feels hard on palpation. Pt had absent bowels sounds for EMS.  Staff reports pt had a BM yesterday. Pt has no complaints for EMS. Pt a/o x 4 and bed bound.

## 2016-04-17 NOTE — ED Notes (Signed)
Dr. Ralene Bathe is aware that patient is stating she is unable to urinate. Dr. Ralene Bathe is comfortable with not rushing and urging for a in/out catheter for urine. Informed Kristine, RN (point nurse) of this information.

## 2016-04-17 NOTE — Consult Note (Addendum)
Consultation  Referring Provider:  Dr. Quintella Reichert   Primary Care Physician:  Hollace Kinnier, DO Primary Gastroenterologist:  Dr. Hilarie Fredrickson      Reason for Consultation: Sigmoid volvulus              HPI:   This is an 82 y.o. Caucasian female with a past medical history significant for a fib, hypertension, Parkinson's, GERD, hyperlipidemia, diabetes, coronary artery disease, diastolic CHF, sigmoid volvulus, peripheral neuropathy and depression who presented to the ED from [facility], with a chief complaint of abdominal pain, abdominal distention, nausea and vomiting. The patients daughter is at the bedside and also provides history.  The patient reports that for the past few days she has generalized abdominal pain, worse before a bowel movement. She reports that her abdomen has continued to "get bigger", and that the pain is crampy and worsening. At its worst, the pain is rated as an 8/10. Decreased bowel movements and loose bowel movements for past few days. Little flatus for past day. Nausea and vomiting today. No hematemesis.  Denies weight loss, change in stool caliber, melena, hematochezia, dysphagia, reflux symptoms, chest pain, fever, chills.  Recent endoscopic procedures:  12/20/15 Flex Sig Dr. Ardis Hughs: Chronic intermittent sigmoid volvulus. The site of volvulus was only slightly twisted during this exam, easily passed with colonoscope and the air filled, dilated colon proximal to it was suctioned extensively. Her abdomen was flat, normal after the examination.  03/12/15-EGD, Dr. Deatra Ina: Impression: In the duodenal bulb there is a 1.5-2 cm ulcer with clot at the base. There was no active bleeding. Surrounding mucosa was edematous. EGD was otherwise normal; pathology: Benign ulcerative duodenitis  05/26/13-flex sig, Dr. Deatra Ina: Impression: Sigmoid volvulus-status post reduction of volvulus and insertion of colon decompression tube  05/24/13-flexible sigmoidoscopy more endoscopy,  Dr. Hilarie Fredrickson: Impression: Evidence of sigmoid volvulus with focal erythema without frank ischemia/necrosis, successful decompression, dilated sigmoid and descending colon, and decompression successful  08/01/12-EGD, Dr. Olevia Perches: Impression: Presbyesophagus with intermittent spasm, dilated       Past Medical History  Diagnosis Date  . Atrial fibrillation ([place])   . Altered mental status   . Encephalopathy   . Parkinson disease ([place])   . Hypertension   . GERD (gastroesophageal reflux disease)   . Hyperlipemia   . Diabetes mellitus   . Coronary artery disease   . History of adenomatous polyp of colon 06/24/99  . Enlarged heart   . DEMENTIA   . Edema of lower extremity 07/13/11    right leg more swollen than left leg  . Esophageal dysmotility 07/02/12  . Horseshoe kidney   . Pancreatic lesion 05/22/11    no further workup  per PCP/family due to age  . Anemia   . Peripheral neuropathy ([place])   . Depression   . Thrombophlebitis   . TIA (transient ischemic attack) 12/19/2012  . Sigmoid volvulus ([place]) 05/24/2013  . Constipation 02/27/2015          Past Surgical History  Procedure Laterality Date  . Shoulder surgery  2001    left clavicle excision and acromioplasty  . Abdominal hysterectomy    . Cholecystectomy    . Total hip arthroplasty    . Breast surgery      2 benign tumors removed left breast  . Foot surgery      benign tumors from foot  . Esophageal dilation      several times by Dr. [name]  . Nose surgery    . Esophagogastroduodenoscopy (  egd) with esophageal dilation N/A 08/01/2012    Procedure: ESOPHAGOGASTRODUODENOSCOPY (EGD) WITH ESOPHAGEAL DILATION;  Surgeon: [name], MD;  Location: [facility];  Service: Endoscopy;  Laterality: N/A;  with c-arm savory dilators  . Flexible sigmoidoscopy N/A 05/24/2013    Procedure: FLEXIBLE SIGMOIDOSCOPY;  Surgeon: [name], MD;  Location: [facility];  Service: Endoscopy;   Laterality: N/A;  . Flexible sigmoidoscopy N/A 05/26/2013    Procedure:  flex with decompression of sigmoid volvulus;  Surgeon: [name], MD;  Location: [facility];  Service: Endoscopy;  Laterality: N/A;  . Esophagogastroduodenoscopy (egd) with propofol N/A 03/12/2015    Procedure: ESOPHAGOGASTRODUODENOSCOPY (EGD) WITH PROPOFOL;  Surgeon: [name], MD;  Location: [facility];  Service: Endoscopy;  Laterality: N/A;         Family History  Problem Relation Age of Onset  . Cancer    . Heart disease           Social History  Substance Use Topics  . Smoking status: Former Smoker    Quit date: 10/09/1964  . Smokeless tobacco: Never Used  . Alcohol Use: No           Allergies as of 12/20/2015 - Unable to Assess 12/20/2015  Allergen Reaction Noted  . Aspirin Other (See Comments) 05/26/2013    Review of Systems:   (limited due to decrease hearing, dementia). As in HPI otherwise negative    Physical Exam:  Vital signs in last 24 hours: Temp:  [97.9 F (36.6 C)-98.3 F (36.8 C)] 97.9 F (36.6 C) (07/17 0800) Pulse Rate:  [65-89] 65 (07/17 0800) Resp:  [13-17] 15 (07/17 0800) BP: (116-133)/(50-53) 116/50 mmHg (07/17 0800) SpO2:  [97 %-100 %] 98 % (07/17 0800) Weight:  [150 lb (68.04 kg)] 150 lb (68.04 kg) (07/17 0755)   General:  Pleasant elderly, caucasian female appears to be in NAD, well developed, well nourished, alert and cooperative Head:  Normocephalic and atraumatic. Eyes:   PEERL, EOMI. No icterus. Conjunctiva pink. Ears:  Severely decreased auditory acuity. Neck:  Supple Throat: Oral cavity and pharynx without inflammation, swelling or lesion. Edentulous Lungs: Respirations even and unlabored. Lungs clear to auscultation bilaterally.   No wheezes, crackles, or rhonchi.  Heart: Normal S1, S2. No MRG. Regular rate and rhythm. No peripheral edema, cyanosis or pallor.  Abdomen:  Soft, moderately distended, hypertympanic,  hyperactive bowel sounds, generalized mild tenderness to palpation, No appreciable masses or hepatomegaly. Rectal:  to be performed at flex sigmoidoscopy tonight Msk:  Symmetrical without gross deformities. Peripheral pulses intact.  Extremities:  Without edema, no deformity or joint abnormality.  Neurologic:  Alert and  oriented x2;  grossly normal neurologically. CN II-XII intact.  Skin:   Dry and intact without significant lesions or rashes. Psychiatric: Oriented to person, place and time. Dementia   LAB RESULTS:  WBC 4.0 - 10.5 K/uL 5.0   RBC 3.87 - 5.11 MIL/uL 3.31    Hemoglobin 12.0 - 15.0 g/dL 11.1    HCT 36.0 - 46.0 % 33.3    MCV 78.0 - 100.0 fL 100.6    MCH 26.0 - 34.0 pg 33.5   MCHC 30.0 - 36.0 g/dL 33.3   RDW 11.5 - 15.5 % 13.9   Platelets 150 - 400 K/uL 119      Sodium 135 - 145 mmol/L 141   Potassium 3.5 - 5.1 mmol/L 3.7   Chloride 101 - 111 mmol/L 109   CO2 22 - 32 mmol/L 25   Glucose, Bld  65 - 99 mg/dL 178    BUN 6 - 20 mg/dL 19   Creatinine, Ser 0.44 - 1.00 mg/dL 1.22    Calcium 8.9 - 10.3 mg/dL 9.1   Total Protein 6.5 - 8.1 g/dL 7.2   Albumin 3.5 - 5.0 g/dL 3.8   AST 15 - 41 U/L 20   ALT 14 - 54 U/L 12    Alkaline Phosphatase 38 - 126 U/L 89   Total Bilirubin 0.3 - 1.2 mg/dL 0.8   GFR calc non Af Amer >60 mL/min 38    GFR calc Af Amer >60 mL/min 44       STUDIES:      Abd/pelvic CT 11/13/17IMPRESSION: Sigmoid volvulus with swirled mesentery, proximal sigmoid and gas distended, 8.8 cm in transaxial dimension. Rectal air-fluid level. Small air-filled duodenum diverticulum 2 mm nonobstructing LEFT nephrolithiasis.  Horseshoe kidney. Severe atherosclerosis.     Impression / Plan:   Impression:  1. Recurrent sigmoid volvulus: Leading to abdominal pain, abdominal distention, N/V. CT shows a sigmoid volvulus with sigmoid colon transition point and dilated proximal colon to 8.8 cm. This is a recurrent problem - previously occured in 05/2013 and  12/2015. Had a colonic ileus in 2015 and 2016.  2. Chronic constipation:   Plan: 1. Flex sigmoidoscopy tonight - attempt to reduce volvulus/colonic decompression. Repeat abdominal films in am. 2. Continue current meds for mgmt of constipation and start antiemetics prn. 3. Consult surgery to review options for mgmt of recurrent volvulus  Mavery Milling T. Fuller Plan, MD [facility]

## 2016-04-18 ENCOUNTER — Encounter (HOSPITAL_COMMUNITY)
Admission: EM | Disposition: A | Discharge status: Skilled Nursing Facility | Payer: Self-pay | Source: Home / Self Care | Attending: Internal Medicine

## 2016-04-18 ENCOUNTER — Encounter (HOSPITAL_COMMUNITY): Payer: Self-pay | Admitting: Gastroenterology

## 2016-04-18 ENCOUNTER — Inpatient Hospital Stay (HOSPITAL_COMMUNITY): Payer: Medicare Other

## 2016-04-18 DIAGNOSIS — E876 Hypokalemia: Secondary | ICD-10-CM

## 2016-04-18 HISTORY — PX: FLEXIBLE SIGMOIDOSCOPY: SHX5431

## 2016-04-18 HISTORY — PX: BOWEL DECOMPRESSION: SHX5532

## 2016-04-18 LAB — COMPREHENSIVE METABOLIC PANEL
ALK PHOS: 69 U/L (ref 38–126)
ALT: 10 U/L — AB (ref 14–54)
AST: 17 U/L (ref 15–41)
Albumin: 3.2 g/dL — ABNORMAL LOW (ref 3.5–5.0)
Anion gap: 5 (ref 5–15)
BILIRUBIN TOTAL: 0.6 mg/dL (ref 0.3–1.2)
BUN: 17 mg/dL (ref 6–20)
CALCIUM: 8.5 mg/dL — AB (ref 8.9–10.3)
CHLORIDE: 112 mmol/L — AB (ref 101–111)
CO2: 24 mmol/L (ref 22–32)
CREATININE: 1.2 mg/dL — AB (ref 0.44–1.00)
GFR, EST AFRICAN AMERICAN: 45 mL/min — AB (ref >60)
GFR, EST NON AFRICAN AMERICAN: 39 mL/min — AB (ref >60)
Glucose, Bld: 124 mg/dL — ABNORMAL HIGH (ref 65–99)
Potassium: 2.9 mmol/L — ABNORMAL LOW (ref 3.5–5.1)
Sodium: 141 mmol/L (ref 135–145)
Total Protein: 5.8 g/dL — ABNORMAL LOW (ref 6.5–8.1)

## 2016-04-18 LAB — GLUCOSE, CAPILLARY
GLUCOSE-CAPILLARY: 107 mg/dL — AB (ref 65–99)
GLUCOSE-CAPILLARY: 126 mg/dL — AB (ref 65–99)
GLUCOSE-CAPILLARY: 137 mg/dL — AB (ref 65–99)
Glucose-Capillary: 109 mg/dL — ABNORMAL HIGH (ref 65–99)
Glucose-Capillary: 110 mg/dL — ABNORMAL HIGH (ref 65–99)
Glucose-Capillary: 138 mg/dL — ABNORMAL HIGH (ref 65–99)
Glucose-Capillary: 90 mg/dL (ref 65–99)

## 2016-04-18 LAB — TSH: TSH: 2.572 u[IU]/mL (ref 0.350–4.500)

## 2016-04-18 LAB — MAGNESIUM: Magnesium: 1.7 mg/dL (ref 1.7–2.4)

## 2016-04-18 LAB — CBC
HCT: 28.8 % — ABNORMAL LOW (ref 36.0–46.0)
Hemoglobin: 9.3 g/dL — ABNORMAL LOW (ref 12.0–15.0)
MCH: 32.6 pg (ref 26.0–34.0)
MCHC: 32.3 g/dL (ref 30.0–36.0)
MCV: 101.1 fL — AB (ref 78.0–100.0)
PLATELETS: 93 10*3/uL — AB (ref 150–400)
RBC: 2.85 MIL/uL — AB (ref 3.87–5.11)
RDW: 14.1 % (ref 11.5–15.5)
WBC: 4 10*3/uL (ref 4.0–10.5)

## 2016-04-18 LAB — MRSA PCR SCREENING: MRSA BY PCR: NEGATIVE

## 2016-04-18 LAB — PHOSPHORUS: Phosphorus: 3.7 mg/dL (ref 2.5–4.6)

## 2016-04-18 SURGERY — SIGMOIDOSCOPY, FLEXIBLE
Anesthesia: Moderate Sedation

## 2016-04-18 MED ORDER — FENTANYL CITRATE (PF) 100 MCG/2ML IJ SOLN
INTRAMUSCULAR | Status: DC | PRN
  Administered 2016-04-18: 12.5 ug via INTRAVENOUS

## 2016-04-18 MED ORDER — POTASSIUM CHLORIDE 2 MEQ/ML IV SOLN
INTRAVENOUS | Status: DC
  Administered 2016-04-18 – 2016-04-19 (×2): via INTRAVENOUS
  Filled 2016-04-18 (×4): qty 1000

## 2016-04-18 MED ORDER — MUSCLE RUB 10-15 % EX CREA
TOPICAL_CREAM | CUTANEOUS | Status: DC | PRN
  Administered 2016-04-18 – 2016-04-22 (×3): via TOPICAL
  Filled 2016-04-18: qty 85

## 2016-04-18 MED ORDER — MIDAZOLAM HCL 5 MG/ML IJ SOLN
INTRAMUSCULAR | Status: AC
Start: 2016-04-18 — End: 2016-04-18
  Filled 2016-04-18: qty 2

## 2016-04-18 MED ORDER — SODIUM CHLORIDE 0.9 % IV SOLN
30.0000 meq | Freq: Once | INTRAVENOUS | Status: AC
  Administered 2016-04-18: 30 meq via INTRAVENOUS
  Filled 2016-04-18: qty 15

## 2016-04-18 MED ORDER — ENSURE ENLIVE PO LIQD
237.0000 mL | Freq: Two times a day (BID) | ORAL | Status: DC
Start: 2016-04-18 — End: 2016-04-24
  Administered 2016-04-18 – 2016-04-21 (×4): 237 mL via ORAL

## 2016-04-18 MED ORDER — FENTANYL CITRATE (PF) 100 MCG/2ML IJ SOLN
INTRAMUSCULAR | Status: AC
  Filled 2016-04-18: qty 2

## 2016-04-18 MED ORDER — MIDAZOLAM HCL 10 MG/2ML IJ SOLN
INTRAMUSCULAR | Status: DC | PRN
Start: 2016-04-18 — End: 2016-04-18
  Administered 2016-04-18: 1 mg via INTRAVENOUS

## 2016-04-18 NOTE — Progress Notes (Signed)
Patient ID: Maria Christian, female   DOB: Dec 16, 1924, 82 y.o.   MRN: [id number]    Progress Note   Subjective   Very HOH.. Denies any abdominal pain today .Marland Kitchen KUB- no change , consistent with sigmoid volvulus    Objective   Vital signs in last 24 hours: Temp:  [98 F (36.7 C)-98.9 F (37.2 C)] 98.1 F (36.7 C) (11/14 0557) Pulse Rate:  [67-98] 82 (11/14 0557) Resp:  [13-21] 18 (11/14 0557) BP: (91-151)/(43-108) 126/54 (11/14 0557) SpO2:  [94 %-100 %] 94 % (11/14 0557) Weight:  [145 lb (65.8 kg)-151 lb 3.2 oz (68.6 kg)] 145 lb (65.8 kg) (11/14 0600) Last BM Date: 04/18/16 General:  Elderly   white female in NAD Heart:  Regular rate and rhythm; no murmurs Lungs: Respirations even and unlabored, lungs CTA bilaterally Abdomen:  Somewhat distended, BS quiet, nontender .  Extremities:  Without edema. Neurologic:  Alert and oriented,  grossly normal neurologically. Psych:  Cooperative. Normal mood and affect.  Intake/Output from previous day: 11/13 0701 - 11/14 0700 In: 1185 [I.V.:1185] Out: -  Intake/Output this shift: Total I/O In: 240 [P.O.:240] Out: -   Lab Results:  Recent Labs  04/17/16 1551 04/18/16 0448  WBC 5.0 4.0  HGB 11.1* 9.3*  HCT 33.3* 28.8*  PLT 119* 93*   BMET  Recent Labs  04/17/16 1551 04/18/16 0448  NA 141 141  K 3.7 2.9*  CL 109 112*  CO2 25 24  GLUCOSE 178* 124*  BUN 19 17  CREATININE 1.22* 1.20*  CALCIUM 9.1 8.5*   LFT  Recent Labs  04/18/16 0448  PROT 5.8*  ALBUMIN 3.2*  AST 17  ALT 10*  ALKPHOS 69  BILITOT 0.6   PT/INR No results for input(s): LABPROT, INR in the last 72 hours.  Studies/Results: Ct Abdomen Pelvis Wo Contrast  Result Date: 04/17/2016 CLINICAL DATA:  Abdominal pain for 3 days, abdominal distention, absent bowel sounds. Bowel movement yesterday. History of small bowel obstruction and volvulus. EXAM: CT ABDOMEN AND PELVIS WITHOUT CONTRAST TECHNIQUE: Multidetector CT imaging of the abdomen and pelvis was  performed following the standard protocol without IV contrast. COMPARISON:  CT abdomen and pelvis March 06, 2016 FINDINGS: LOWER CHEST: Similar bronchial wall thickening. The heart is enlarged with small pericardial effusion. Severe coronary artery and mitral annular calcifications. HEPATOBILIARY: The liver is normal, status post cholecystectomy. PANCREAS: Atrophic pancreas, otherwise unremarkable. SPLEEN: Normal. ADRENALS/URINARY TRACT: Horseshoe kidney. 2 mm LEFT lower pole nephrolithiasis without hydronephrosis. Limited assessment for renal mass by noncontrast CT. Urinary bladder is well distended, unremarkable. Normal adrenal glands. STOMACH/BOWEL: Sigmoid volvulus with swirled mesentery, proximal sigmoid and gas distended, 8.8 cm in transaxial dimension. Rectal air-fluid level. Small air-filled duodenum diverticulum. VASCULAR/LYMPHATIC: Aortoiliac vessels are normal in course and caliber, severe calcific atherosclerosis. No lymphadenopathy by CT size criteria. REPRODUCTIVE: Normal. OTHER: No intraperitoneal free fluid or free air. MUSCULOSKELETAL: Non-acute. Status post LEFT hip total arthroplasty. Osteopenia. Multilevel severe facet arthropathy. Anterior pelvic wall scarring. IMPRESSION: Sigmoid volvulus. 2 mm nonobstructing LEFT nephrolithiasis.  Horseshoe kidney. Severe atherosclerosis. Acute findings discussed with and reconfirmed by [facility] on 04/17/2016 at 5:28 pm. Electronically Signed   By: Elon Alas M.D.   On: 04/17/2016 17:28   Dg Abd 2 Views  Result Date: 04/18/2016 CLINICAL DATA:  Abdominal pain and distention for several weeks. Evidence for a sigmoid volvulus on recent CT. EXAM: ABDOMEN - 2 VIEW COMPARISON:  12/20/2015 and abdominal CT 04/17/2016 FINDINGS: Again noted are dilated gas-filled loops  of sigmoid colon in the right abdomen. The configuration would be compatible with a sigmoid volvulus and not changed from the recent CT. There continues to be gas-filled transverse  colon. There is no evidence for free air. Again noted is a left hip arthroplasty. Surgical clips in the right upper abdomen. IMPRESSION: Stable distension of the sigmoid colon and the gas pattern would be compatible with a sigmoid volvulus. No change from the recent CT exam. No evidence for free air. Electronically Signed   By: Markus Daft M.D.   On: 04/18/2016 10:15       Assessment / Plan:    #1 82 yo female with recurrent sigmoid volvulus - s/p decompression last night- KUB this am unfortunately looks the same .  Will proceed with repeat flex with decompression  again this am  Pt needs surgical consult and only definitive management is surgery , and this is likely to recur fairly quickly.  Active Problems:   Diastolic CHF, chronic (HCC)   Coronary artery disease   Dementia without behavioral disturbance   Anemia of chronic disease   Chronic atrial fibrillation (HCC)   CKD (chronic kidney disease) stage 3, GFR 30-59 ml/min   Chronic respiratory failure (HCC)   Chronic pain   Sigmoid volvulus (HCC)   DM (diabetes mellitus), type 2 ([place])     LOS: 1 day   Maria Christian  04/18/2016, 10:34 AM

## 2016-04-18 NOTE — Progress Notes (Signed)
Initial Nutrition Assessment  DOCUMENTATION CODES:   Not applicable  INTERVENTION:  Encourage intake of meals and beverages on soft diet.  Provide Ensure Enlive po BID, each supplement provides 350 kcal and 20 grams of protein.  Will monitor intake and for outcomes of discussions regarding goals of care.   NUTRITION DIAGNOSIS:   Inadequate oral intake related to vomiting, nausea (abdominal pain) as evidenced by other (see comment) (per chart, findings of volvulus).  GOAL:   Patient will meet greater than or equal to 90% of their needs  MONITOR:   PO intake, Supplement acceptance, Labs, Weight trends, I & O's  REASON FOR ASSESSMENT:   Malnutrition Screening Tool    ASSESSMENT:   82 y.o.femaleSNF resident,bed to wheelchair bound, dementia, Parkinson's disease, atrial fibrillation not on anticoagulation, HTN, diastolic CHF, CAD, GERD,CKD, DM2, recurrent volvulus presented with 2 days hx of abdominal pain Nausea and vomiting. Found to have volvulus status post decompression by gastroenterologist.   -Patient is from [facility] on [address] in for palliative care to discuss goals of care and symptom management with patient and family.  Attempted to speak with patient at bedside but she was lethargic/sleeping. Did not wake to name call. Discussed with RN. Patient lethargic after medications she received during flexible sigmoidoscopy today (fentanyl, Versed). Patient ate breakfast this morning but has not had anything else today. No family present at this time.  Able to review chart from [facility] facility. Patient's appetite has been poor, but her weight has been slowly increasing due to [facility] nutritional supplements (per chart patient takes 90 ml TID - provides 540 kcal, 23 grams protein). Patient also on multivitamin with mineral daily.  Meal Completion: 100% of breakfast per chart  Medications reviewed and include: Colace, Novolog sliding scale Q4hrs,  pantoprazole, potassium chloride 30 mEq once today, senna, 1/2NS with potassium chloride 40 mEq/L @ 100 ml/hr.  Labs reviewed: CBG 109-110, Potassium 2.9, Chloride 112, Creatinine 1.2, Albumin 3.2.  Limited Nutrition-Focused physical exam completed as patient awoke upon exam and was confused. Shortly went back to sleep, but did not want to wake patient again. Findings are mild fat depletion, severe muscle depletion.    Diet Order:  DIET SOFT Room service appropriate? Yes; Fluid consistency: Thin  Skin:  Reviewed, no issues  Last BM:  04/18/2016  Height:   Ht Readings from Last 1 Encounters:  04/18/16 5\' 5"  (1.651 m)    Weight:   Wt Readings from Last 1 Encounters:  04/18/16 145 lb (65.8 kg)    Ideal Body Weight:  56.82 kg  BMI:  Body mass index is 24.13 kg/m.  Estimated Nutritional Needs:   Kcal:  1300-1500 (MSJ x 1.2-1.4)  Protein:  65-80 grams (1-1.2 grams/kg)  Fluid:  >/= 1.6 L/day (25 ml/kg)  EDUCATION NEEDS:   Education needs no appropriate at this time  Willey Blade, MS, RD, LDN Pager: [phone] After Hours Pager: [phone]

## 2016-04-18 NOTE — Progress Notes (Signed)
I spoke to Dr. Harlow Asa about possible surgical options for recurrent volvulus.  Any surgery is high risk for this patient.  The surgery with likely the least risk would be sigmoidectomy with end colostomy.   Nonsurgical management would be more of the same. Laxatives and supportive care. I expect without surgery volvulus will recur and lead to need for endoscopic decompression. What is not clear is how often this will be a problem and when it will recur. I have discussed this by phone with [name] and her son, Maria Christian [facility]).  I have explained options, medical and surgical, with them.  They plan to discuss as a family and involve their mother's wishes in making decision regarding possible surgery. I will touch base with Maria Christian again tomorrow

## 2016-04-18 NOTE — Progress Notes (Addendum)
PROGRESS NOTE    Maria Christian  [id number] DOB: 11/19/1924 DOA: 04/17/2016 PCP: [name], DO   Brief Narrative: 82 y.o.femaleSNF resident, bed to wheelchair bound,  dementia, Parkinson's disease, atrial fibrillation not on anticoagulation, HTN, diastolic CHF, CAD, GERD, CKD, DM2,  recurrent volvulus presented with 2 days hx of abdominal pain Nausea and vomiting. Found to have volvulus status post decompression by gastroenterologist.  Assessment & Plan:   #Recurrent sigmoid volvulus: GI consult appreciated. Status post sigmoidoscopy and decompression on admission. Repeat abdominal x-ray today showed distention of sigmoid colon which is compatible with sigmoid volvulus. Patient underwent second sigmoidoscopy and complete decompression today by GI. As per admission note, patient was evaluated by surgery and felt she is not a surgical candidate.  -Continue supportive care and advance diet as tolerated slowly.  -Continue IV fluid, Protonix, bowel regimen to prevent constipation. -Palliative care was consulted on admission. Patient is DO NOT RESUSCITATE. Social worker referral for safe discharge planning.  #  Dementia without behavioral disturbance: Continue to provide supportive care.   #  Chronic atrial fibrillation [facility]): Monitor heart rate. Patient is not on systemic anticoagulant. Continue Coreg.    #Essential hypertension: On low-dose amlodipine and Coreg. Monitor blood pressure.  #Hypokalemia: Repleting potassium chloride IV. repeat BMP in the morning.  Addendum 3:37 Pm: palliative care note reviewed. General surgery consult called.   DVT prophylaxis: on aspirin and SCD. Code Status: DO NOT RESUSCITATE Family Communication: no family present at bedside Disposition Plan: likely discharge to nursing home in 1-2 days  Consultants:   Gastroenterologist  Procedures: sigmoidoscopy twice Antimicrobials: none  Subjective: patient was seen and examined at bedside. Patient  is complaining of abdominal pain and nausea. Denied vomiting, chest pain or shortness of breath. Review of systems Limited because of her dementia.   Objective: Vitals:   04/18/16 1146 04/18/16 1150 04/18/16 1200 04/18/16 1210  BP: (!) 151/62 (!) 153/52  (!) 149/53  Pulse: 70  64   Resp: 15 12    Temp: 98 F (36.7 C)     TempSrc: Oral     SpO2: 100%     Weight:      Height:        Intake/Output Summary (Last 24 hours) at 04/18/16 1324 Last data filed at 04/18/16 0900  Gross per 24 hour  Intake             1425 ml  Output                0 ml  Net             1425 ml   Filed Weights   04/18/16 0600  Weight: 65.8 kg (145 lb)    Examination:  General exam: Elderly female lying on bed,Appears calm and comfortable  Respiratory system: Clear to auscultation. Respiratory effort normal. No wheezing or crackle Cardiovascular system: S1 & S2 heard, RRR.  No pedal edema. Gastrointestinal system:  Abdomen soft, diffuse tenderness and mild distention. Bowel sound sluggish. Central nervous system: A, Awake and following simple commands. Extremities: Symmetric 5 x 5 power. Skin: No rashes, lesions or ulcers   Data Reviewed: I have personally reviewed following labs and imaging studies  CBC:  Recent Labs Lab 04/17/16 1551 04/18/16 0448  WBC 5.0 4.0  NEUTROABS 3.0  --   HGB 11.1* 9.3*  HCT 33.3* 28.8*  MCV 100.6* 101.1*  PLT 119* 93*   Basic Metabolic Panel:  Recent Labs Lab 04/17/16 1551 04/18/16 0448  NA 141 141  K 3.7 2.9*  CL 109 112*  CO2 25 24  GLUCOSE 178* 124*  BUN 19 17  CREATININE 1.22* 1.20*  CALCIUM 9.1 8.5*  MG  --  1.7  PHOS  --  3.7   GFR: Estimated Creatinine Clearance: 28.6 mL/min (by C-G formula based on SCr of 1.2 mg/dL (H)). Liver Function Tests:  Recent Labs Lab 04/17/16 1551 04/18/16 0448  AST 20 17  ALT 12* 10*  ALKPHOS 89 69  BILITOT 0.8 0.6  PROT 7.2 5.8*  ALBUMIN 3.8 3.2*   No results for input(s): LIPASE, AMYLASE in the  last 168 hours. No results for input(s): AMMONIA in the last 168 hours. Coagulation Profile: No results for input(s): INR, PROTIME in the last 168 hours. Cardiac Enzymes: No results for input(s): CKTOTAL, CKMB, CKMBINDEX, TROPONINI in the last 168 hours. BNP (last 3 results) No results for input(s): PROBNP in the last 8760 hours. HbA1C: No results for input(s): HGBA1C in the last 72 hours. CBG:  Recent Labs Lab 04/18/16 0012 04/18/16 0412 04/18/16 0725  GLUCAP 137* 126* 110*   Lipid Profile: No results for input(s): CHOL, HDL, LDLCALC, TRIG, CHOLHDL, LDLDIRECT in the last 72 hours. Thyroid Function Tests:  Recent Labs  04/18/16 0448  TSH 2.572   Anemia Panel: No results for input(s): VITAMINB12, FOLATE, FERRITIN, TIBC, IRON, RETICCTPCT in the last 72 hours. Sepsis Labs:  Recent Labs Lab 04/17/16 1617  LATICACIDVEN 1.17    Recent Results (from the past 240 hour(s))  MRSA PCR Screening     Status: None   Collection Time: 04/17/16 11:40 PM  Result Value Ref Range Status   MRSA by PCR NEGATIVE NEGATIVE Final    Comment:        The GeneXpert MRSA Assay (FDA approved for NASAL specimens only), is one component of a comprehensive MRSA colonization surveillance program. It is not intended to diagnose MRSA infection nor to guide or monitor treatment for MRSA infections.          Radiology Studies: Ct Abdomen Pelvis Wo Contrast  Result Date: 04/17/2016 CLINICAL DATA:  Abdominal pain for 3 days, abdominal distention, absent bowel sounds. Bowel movement yesterday. History of small bowel obstruction and volvulus. EXAM: CT ABDOMEN AND PELVIS WITHOUT CONTRAST TECHNIQUE: Multidetector CT imaging of the abdomen and pelvis was performed following the standard protocol without IV contrast. COMPARISON:  CT abdomen and pelvis March 06, 2016 FINDINGS: LOWER CHEST: Similar bronchial wall thickening. The heart is enlarged with small pericardial effusion. Severe coronary  artery and mitral annular calcifications. HEPATOBILIARY: The liver is normal, status post cholecystectomy. PANCREAS: Atrophic pancreas, otherwise unremarkable. SPLEEN: Normal. ADRENALS/URINARY TRACT: Horseshoe kidney. 2 mm LEFT lower pole nephrolithiasis without hydronephrosis. Limited assessment for renal mass by noncontrast CT. Urinary bladder is well distended, unremarkable. Normal adrenal glands. STOMACH/BOWEL: Sigmoid volvulus with swirled mesentery, proximal sigmoid and gas distended, 8.8 cm in transaxial dimension. Rectal air-fluid level. Small air-filled duodenum diverticulum. VASCULAR/LYMPHATIC: Aortoiliac vessels are normal in course and caliber, severe calcific atherosclerosis. No lymphadenopathy by CT size criteria. REPRODUCTIVE: Normal. OTHER: No intraperitoneal free fluid or free air. MUSCULOSKELETAL: Non-acute. Status post LEFT hip total arthroplasty. Osteopenia. Multilevel severe facet arthropathy. Anterior pelvic wall scarring. IMPRESSION: Sigmoid volvulus. 2 mm nonobstructing LEFT nephrolithiasis.  Horseshoe kidney. Severe atherosclerosis. Acute findings discussed with and reconfirmed by [facility] REES on 04/17/2016 at 5:28 pm. Electronically Signed   By: Elon Alas M.D.   On: 04/17/2016 17:28   Dg Abd 2 Views  Result  Date: 04/18/2016 CLINICAL DATA:  Abdominal pain and distention for several weeks. Evidence for a sigmoid volvulus on recent CT. EXAM: ABDOMEN - 2 VIEW COMPARISON:  12/20/2015 and abdominal CT 04/17/2016 FINDINGS: Again noted are dilated gas-filled loops of sigmoid colon in the right abdomen. The configuration would be compatible with a sigmoid volvulus and not changed from the recent CT. There continues to be gas-filled transverse colon. There is no evidence for free air. Again noted is a left hip arthroplasty. Surgical clips in the right upper abdomen. IMPRESSION: Stable distension of the sigmoid colon and the gas pattern would be compatible with a sigmoid volvulus. No  change from the recent CT exam. No evidence for free air. Electronically Signed   By: Markus Daft M.D.   On: 04/18/2016 10:15        Scheduled Meds: . amLODipine  2.5 mg Oral Daily  . aspirin EC  81 mg Oral Daily  . baclofen  5 mg Oral QHS  . carvedilol  3.125 mg Oral Daily  . docusate sodium  200 mg Oral QHS  . dorzolamide  1 drop Both Eyes TID  . escitalopram  10 mg Oral Daily  . insulin aspart  0-9 Units Subcutaneous Q4H  . latanoprost  1 drop Both Eyes QHS  . linaclotide  145 mcg Oral QAC breakfast  . pantoprazole  40 mg Oral Q0600  . polyethylene glycol  17 g Oral Q0600  . senna  1 tablet Oral BID  . sodium chloride flush  3 mL Intravenous Q12H   Continuous Infusions:   LOS: 1 day    Time spent: 26 minutes.    Dron Tanna Furry, MD [facility] Pager [phone]  If 7PM-7AM, please contact night-coverage [email or web address] Password [email or web address] 04/18/2016, 1:24 PM

## 2016-04-18 NOTE — Consult Note (Signed)
   [facility] CM Inpatient Consult   04/18/2016  SHAYONNA WHITINGER [birth year] [id number]    Patient screened for potential [facility] Care Management services. Chart reviewed. Noted current discharge plan is to return to SNF at discharge. Noted patient that patient is a longterm resident of SNF. There are no identifiable [facility] Care Management needs at this time. If patient's post hospital needs change, please place a [facility] Care Management consult. For questions please contact:  Marthenia Rolling, [place], RN,BSN [facility] Liaison [phone]

## 2016-04-18 NOTE — Op Note (Signed)
[facility] Patient Name: Maria Christian Procedure Date: 04/18/2016 MRN: [id number] Attending MD: [name] , MD Date of Birth: 1924-08-10 CSN: [id number] Age: 82 Admit Type: Inpatient Procedure:                Flexible Sigmoidoscopy Indications:              Recurrent sigmoid volvulus, flexible sigmoidoscopy                            last night for decompression Providers:                Lajuan Lines. Hilarie Fredrickson, MD, Dustin Flock RN, RN, Elspeth Cho Tech., Technician Referring MD:             [facility] Medicines:                Fentanyl 12.5 micrograms IV, Midazolam 1 mg IV Complications:            No immediate complications. Estimated Blood Loss:     Estimated blood loss: none. Procedure:                Pre-Anesthesia Assessment:                           - Prior to the procedure, a History and Physical                            was performed, and patient medications and                            allergies were reviewed. The patient's tolerance of                            previous anesthesia was also reviewed. The risks                            and benefits of the procedure and the sedation                            options and risks were discussed with the patient.                            All questions were answered, and informed consent                            was obtained. Prior Anticoagulants: The patient has                            taken no previous anticoagulant or antiplatelet                            agents. ASA Grade Assessment: III - A patient with  severe systemic disease. After reviewing the risks                            and benefits, the patient was deemed in                            satisfactory condition to undergo the procedure.                           After obtaining informed consent, the scope was                            passed under direct vision. The  Colonoscope was                            introduced through the anus and advanced to the the                            descending colon. The flexible sigmoidoscopy was                            accomplished without difficulty. The patient                            tolerated the procedure well. The quality of the                            bowel preparation was adequate. Scope In: Scope Out: Findings:      The perianal examination reveals pressure ulcer covered with dressing       and digital rectal examinations was normal.      A volvulus, with viable appearing mucosa, was found in the distal       sigmoid colon. Decompression of the volvulus was attempted and was       successful, with complete decompression achieved. Estimated blood loss:       none.      Non-bleeding internal hemorrhoids were found during retroflexion. The       hemorrhoids were small. Impression:               - Volvulus. Successful complete decompression                            achieved.                           - Non-bleeding internal hemorrhoids.                           - No specimens collected. Moderate Sedation:      Moderate (conscious) sedation was administered by the endoscopy nurse       and supervised by the endoscopist. The following parameters were       monitored: oxygen saturation, heart rate, blood pressure, and response       to care. Total physician intraservice time was 14 minutes. Recommendation:           - Return patient to hospital  ward for ongoing care.                           - Continue laxatives to prevent constipation.                           - Replace electrolytes as needed. Goal potassium                            greater than 4.0                           - Definitive treatment to prevent recurrence would                            be surgical. Risk of recurrent volvulus is very                            high. Procedure Code(s):        --- Professional ---                            [phone], Sigmoidoscopy, flexible; with decompression                            (for pathologic distention) (eg, volvulus,                            megacolon), including placement of decompression                            tube, when performed                           G0500, Moderate sedation services provided by the                            same physician or other qualified health care                            professional performing a gastrointestinal                            endoscopic service that sedation supports,                            requiring the presence of an independent trained                            observer to assist in the monitoring of the                            patient's level of consciousness and physiological                            status; initial 15 minutes of intra-service time;  patient age 37 years or older (additional time may                            be reported with [phone], as appropriate) Diagnosis Code(s):        --- Professional ---                           K56.2, Volvulus                           K64.8, Other hemorrhoids CPT copyright 2016 American Medical Association. All rights reserved. The codes documented in this report are preliminary and upon coder review may  be revised to meet current compliance requirements. [name], MD 04/18/2016 11:51:07 AM This report has been signed electronically. Number of Addenda: 0

## 2016-04-18 NOTE — H&P (View-Only) (Signed)
Consultation  Referring Provider:  Dr. Quintella Reichert   Primary Care Physician:  Hollace Kinnier, DO Primary Gastroenterologist:  Dr. Hilarie Fredrickson      Reason for Consultation: Sigmoid volvulus              HPI:   This is an 82 y.o. Caucasian female with a past medical history significant for a fib, hypertension, Parkinson's, GERD, hyperlipidemia, diabetes, coronary artery disease, diastolic CHF, sigmoid volvulus, peripheral neuropathy and depression who presented to the ED from [facility], with a chief complaint of abdominal pain, abdominal distention, nausea and vomiting. The patients daughter is at the bedside and also provides history.  The patient reports that for the past few days she has generalized abdominal pain, worse before a bowel movement. She reports that her abdomen has continued to "get bigger", and that the pain is crampy and worsening. At its worst, the pain is rated as an 8/10. Decreased bowel movements and loose bowel movements for past few days. Little flatus for past day. Nausea and vomiting today. No hematemesis.  Denies weight loss, change in stool caliber, melena, hematochezia, dysphagia, reflux symptoms, chest pain, fever, chills.  Recent endoscopic procedures:  12/20/15 Flex Sig Dr. Ardis Hughs: Chronic intermittent sigmoid volvulus. The site of volvulus was only slightly twisted during this exam, easily passed with colonoscope and the air filled, dilated colon proximal to it was suctioned extensively. Her abdomen was flat, normal after the examination.  03/12/15-EGD, Dr. Deatra Ina: Impression: In the duodenal bulb there is a 1.5-2 cm ulcer with clot at the base. There was no active bleeding. Surrounding mucosa was edematous. EGD was otherwise normal; pathology: Benign ulcerative duodenitis  05/26/13-flex sig, Dr. Deatra Ina: Impression: Sigmoid volvulus-status post reduction of volvulus and insertion of colon decompression tube  05/24/13-flexible sigmoidoscopy more endoscopy,  Dr. Hilarie Fredrickson: Impression: Evidence of sigmoid volvulus with focal erythema without frank ischemia/necrosis, successful decompression, dilated sigmoid and descending colon, and decompression successful  08/01/12-EGD, Dr. Olevia Perches: Impression: Presbyesophagus with intermittent spasm, dilated       Past Medical History  Diagnosis Date  . Atrial fibrillation ([place])   . Altered mental status   . Encephalopathy   . Parkinson disease ([place])   . Hypertension   . GERD (gastroesophageal reflux disease)   . Hyperlipemia   . Diabetes mellitus   . Coronary artery disease   . History of adenomatous polyp of colon 06/24/99  . Enlarged heart   . DEMENTIA   . Edema of lower extremity 07/13/11    right leg more swollen than left leg  . Esophageal dysmotility 07/02/12  . Horseshoe kidney   . Pancreatic lesion 05/22/11    no further workup  per PCP/family due to age  . Anemia   . Peripheral neuropathy ([place])   . Depression   . Thrombophlebitis   . TIA (transient ischemic attack) 12/19/2012  . Sigmoid volvulus ([place]) 05/24/2013  . Constipation 02/27/2015          Past Surgical History  Procedure Laterality Date  . Shoulder surgery  2001    left clavicle excision and acromioplasty  . Abdominal hysterectomy    . Cholecystectomy    . Total hip arthroplasty    . Breast surgery      2 benign tumors removed left breast  . Foot surgery      benign tumors from foot  . Esophageal dilation      several times by Dr. [name]  . Nose surgery    . Esophagogastroduodenoscopy (  egd) with esophageal dilation N/A 08/01/2012    Procedure: ESOPHAGOGASTRODUODENOSCOPY (EGD) WITH ESOPHAGEAL DILATION;  Surgeon: [name], MD;  Location: [facility] ENDOSCOPY;  Service: Endoscopy;  Laterality: N/A;  with c-arm savory dilators  . Flexible sigmoidoscopy N/A 05/24/2013    Procedure: FLEXIBLE SIGMOIDOSCOPY;  Surgeon: [name], MD;  Location: [facility] ENDOSCOPY;  Service: Endoscopy;   Laterality: N/A;  . Flexible sigmoidoscopy N/A 05/26/2013    Procedure:  flex with decompression of sigmoid volvulus;  Surgeon: [name], MD;  Location: [facility] ENDOSCOPY;  Service: Endoscopy;  Laterality: N/A;  . Esophagogastroduodenoscopy (egd) with propofol N/A 03/12/2015    Procedure: ESOPHAGOGASTRODUODENOSCOPY (EGD) WITH PROPOFOL;  Surgeon: [name], MD;  Location: [facility] ENDOSCOPY;  Service: Endoscopy;  Laterality: N/A;         Family History  Problem Relation Age of Onset  . Cancer    . Heart disease           Social History  Substance Use Topics  . Smoking status: Former Smoker    Quit date: 10/09/1964  . Smokeless tobacco: Never Used  . Alcohol Use: No           Allergies as of 12/20/2015 - Unable to Assess 12/20/2015  Allergen Reaction Noted  . Aspirin Other (See Comments) 05/26/2013    Review of Systems:   (limited due to decrease hearing, dementia). As in HPI otherwise negative    Physical Exam:  Vital signs in last 24 hours: Temp:  [97.9 F (36.6 C)-98.3 F (36.8 C)] 97.9 F (36.6 C) (07/17 0800) Pulse Rate:  [65-89] 65 (07/17 0800) Resp:  [13-17] 15 (07/17 0800) BP: (116-133)/(50-53) 116/50 mmHg (07/17 0800) SpO2:  [97 %-100 %] 98 % (07/17 0800) Weight:  [150 lb (68.04 kg)] 150 lb (68.04 kg) (07/17 0755)   General:  Pleasant elderly, caucasian female appears to be in NAD, well developed, well nourished, alert and cooperative Head:  Normocephalic and atraumatic. Eyes:   PEERL, EOMI. No icterus. Conjunctiva pink. Ears:  Severely decreased auditory acuity. Neck:  Supple Throat: Oral cavity and pharynx without inflammation, swelling or lesion. Edentulous Lungs: Respirations even and unlabored. Lungs clear to auscultation bilaterally.   No wheezes, crackles, or rhonchi.  Heart: Normal S1, S2. No MRG. Regular rate and rhythm. No peripheral edema, cyanosis or pallor.  Abdomen:  Soft, moderately distended, hypertympanic,  hyperactive bowel sounds, generalized mild tenderness to palpation, No appreciable masses or hepatomegaly. Rectal:  to be performed at flex sigmoidoscopy tonight Msk:  Symmetrical without gross deformities. Peripheral pulses intact.  Extremities:  Without edema, no deformity or joint abnormality.  Neurologic:  Alert and  oriented x2;  grossly normal neurologically. CN II-XII intact.  Skin:   Dry and intact without significant lesions or rashes. Psychiatric: Oriented to person, place and time. Dementia   LAB RESULTS:  WBC 4.0 - 10.5 K/uL 5.0   RBC 3.87 - 5.11 MIL/uL 3.31    Hemoglobin 12.0 - 15.0 g/dL 11.1    HCT 36.0 - 46.0 % 33.3    MCV 78.0 - 100.0 fL 100.6    MCH 26.0 - 34.0 pg 33.5   MCHC 30.0 - 36.0 g/dL 33.3   RDW 11.5 - 15.5 % 13.9   Platelets 150 - 400 K/uL 119      Sodium 135 - 145 mmol/L 141   Potassium 3.5 - 5.1 mmol/L 3.7   Chloride 101 - 111 mmol/L 109   CO2 22 - 32 mmol/L 25   Glucose, Bld  65 - 99 mg/dL 178    BUN 6 - 20 mg/dL 19   Creatinine, Ser 0.44 - 1.00 mg/dL 1.22    Calcium 8.9 - 10.3 mg/dL 9.1   Total Protein 6.5 - 8.1 g/dL 7.2   Albumin 3.5 - 5.0 g/dL 3.8   AST 15 - 41 U/L 20   ALT 14 - 54 U/L 12    Alkaline Phosphatase 38 - 126 U/L 89   Total Bilirubin 0.3 - 1.2 mg/dL 0.8   GFR calc non Af Amer >60 mL/min 38    GFR calc Af Amer >60 mL/min 44       STUDIES:      Abd/pelvic CT 11/13/17IMPRESSION: Sigmoid volvulus with swirled mesentery, proximal sigmoid and gas distended, 8.8 cm in transaxial dimension. Rectal air-fluid level. Small air-filled duodenum diverticulum 2 mm nonobstructing LEFT nephrolithiasis.  Horseshoe kidney. Severe atherosclerosis.     Impression / Plan:   Impression:  1. Recurrent sigmoid volvulus: Leading to abdominal pain, abdominal distention, N/V. CT shows a sigmoid volvulus with sigmoid colon transition point and dilated proximal colon to 8.8 cm. This is a recurrent problem - previously occured in 05/2013 and  12/2015. Had a colonic ileus in 2015 and 2016.  2. Chronic constipation:   Plan: 1. Flex sigmoidoscopy tonight - attempt to reduce volvulus/colonic decompression. Repeat abdominal films in am. 2. Continue current meds for mgmt of constipation and start antiemetics prn. 3. Consult surgery to review options for mgmt of recurrent volvulus  Lowella Kindley T. Fuller Plan, MD [facility]

## 2016-04-18 NOTE — Interval H&P Note (Signed)
History and Physical Interval Note:  04/18/2016 8:38 AM  Maria Christian  has presented today for surgery, with the diagnosis of Vulvus  The various methods of treatment have been discussed with the patient and family. After consideration of risks, benefits and other options for treatment, the patient has consented to  Procedure(s): FLEXIBLE SIGMOIDOSCOPY (N/A) as a surgical intervention .  The patient's history has been reviewed, patient examined, no change in status, stable for surgery.  I have reviewed the patient's chart and labs.  Questions were answered to the patient's satisfaction.     Pricilla Riffle. Fuller Plan

## 2016-04-18 NOTE — Clinical Social Work Note (Signed)
Clinical Social Work Assessment  Patient Details  Name: Maria Christian MRN: [id number] Date of Birth: 06/07/1924  Date of referral:  04/18/16               Reason for consult:  Facility Placement                Permission sought to share information with:  Facility Sport and exercise psychologist, Family Supports Permission granted to share information::  Yes, Verbal Permission Granted  Name::     [name]   Agency::  [facility]   Relationship::    Daughter   Contact Information:   [phone]  Housing/Transportation Living arrangements for the past 2 months:  [place] of Information:  Adult Children (Daughter) Patient Interpreter Needed:  None Criminal Activity/Legal Involvement Pertinent to Current Situation/Hospitalization:    Significant Relationships:  Adult Children Lives with:  Facility Resident Do you feel safe going back to the place where you live?  Yes Need for family participation in patient care:  Yes (Comment)  Care giving concerns:  Patient plan at the time is to return to [facility]  when medically stable.    Social Worker assessment / plan:  LCSWA spoke with patient daughter, daughter at bedside. She reports pt. Is to have surgery and the plan is for her to return to skilled nursing facility. The patient is a long term care resident.   Employment status:  Retired Forensic scientist:  Medicare PT Recommendations:  Not assessed at this time Information / Referral to community resources:  [place]  Patient/Family's Response to care:  Agreeable to Care and appreciative of CSW support  Patient/Family's Understanding of and Emotional Response to Diagnosis, Current Treatment, and Prognosis:  Patient daughter is knowledgeable of patient condition and current treatment. She is hopeful the surgery will go well and her mother will be able to return to SNF.   Emotional Assessment Appearance:  Appears stated  age Attitude/Demeanor/Rapport:  Lethargic Affect (typically observed):  Calm, Quiet Orientation:  Oriented to Self Alcohol / Substance use:  Not Applicable Psych involvement (Current and /or in the community):  No (Comment)  Discharge Needs  Concerns to be addressed:  Discharge Planning Concerns, Care Coordination Readmission within the last 30 days:  Yes Current discharge risk:  Dependent with Mobility Barriers to Discharge:  Continued Medical Work up   [employer], LCSW 04/18/2016, 2:17 PM

## 2016-04-18 NOTE — Consult Note (Signed)
[facility] Surgery Consult Note  Maria Christian [birth year]  [id number].    Requesting MD: [name], MD Chief Complaint/Reason for Consult: Recurrent sigmoid volvulus   HPI:   82 y/o female with a PMH recurrent sigmoid volvulus (05/2013, 12/2015), Parkinson's, dementia, CAD, CKD III, anemia of chronic disease, chronic a.fib not anticoagulated, CHFpEF, GERD and DM II with peripheral neuropathy who presented to [facility] from [facility] on 04/17/16 with abdominal pain, distention, and nausea. CT scan was significant for a sigmoid volvulus and the patient received decompression by flex sig by Dr. Russella Dar 11/13. Patient required repeated reduction today, 11/14 by Dr. Rhea Belton. General surgery has been asked to consult regarding surgical correction of volvulus. The patients HCPOA is her son, [name]. At baseline the patient is bed/wheelchair bound after breaking her hip 3-4 years ago and she eats and drinks without complication. Past abdominal surgeries include cholecystectomy and abdominal hysterectomy.  ROS: All systems reviewed and otherwise negative except for as above  Family History  Problem Relation Age of Onset  . Cancer    . Heart disease      Past Medical History:  Diagnosis Date  . Altered mental status   . Anemia   . Atrial fibrillation (HCC)   . Constipation 02/27/2015  . Coronary artery disease   . DEMENTIA   . Depression   . Diabetes mellitus   . Edema of lower extremity 07/13/11   right leg more swollen than left leg  . Encephalopathy   . Enlarged heart   . Esophageal dysmotility 07/02/12  . GERD (gastroesophageal reflux disease)   . History of adenomatous polyp of colon 06/24/99  . Horseshoe kidney   . Hyperlipemia   . Hypertension   . Pancreatic lesion 05/22/11   no further workup  per PCP/family due to age  . Parkinson disease (HCC)   . Peripheral neuropathy (HCC)   . Sigmoid volvulus (HCC) 05/24/2013  . Thrombophlebitis   . TIA (transient ischemic attack)  12/19/2012    Past Surgical History:  Procedure Laterality Date  . ABDOMINAL HYSTERECTOMY    . BREAST SURGERY     2 benign tumors removed left breast  . CHOLECYSTECTOMY    . ESOPHAGEAL DILATION     several times by Dr. Victorino Dike  . ESOPHAGOGASTRODUODENOSCOPY (EGD) WITH ESOPHAGEAL DILATION N/A 08/01/2012   Procedure: ESOPHAGOGASTRODUODENOSCOPY (EGD) WITH ESOPHAGEAL DILATION;  Surgeon: Hart Carwin, MD;  Location: [facility];  Service: Endoscopy;  Laterality: N/A;  with c-arm savory dilators  . ESOPHAGOGASTRODUODENOSCOPY (EGD) WITH PROPOFOL N/A 03/12/2015   Procedure: ESOPHAGOGASTRODUODENOSCOPY (EGD) WITH PROPOFOL;  Surgeon: Louis Meckel, MD;  Location: [facility];  Service: Endoscopy;  Laterality: N/A;  . FLEXIBLE SIGMOIDOSCOPY N/A 05/24/2013   Procedure: FLEXIBLE SIGMOIDOSCOPY;  Surgeon: Beverley Fiedler, MD;  Location: [facility];  Service: Endoscopy;  Laterality: N/A;  . FLEXIBLE SIGMOIDOSCOPY N/A 05/26/2013   Procedure:  flex with decompression of sigmoid volvulus;  Surgeon: Louis Meckel, MD;  Location: [facility];  Service: Endoscopy;  Laterality: N/A;  . FLEXIBLE SIGMOIDOSCOPY N/A 12/20/2015   Procedure: FLEXIBLE SIGMOIDOSCOPY;  Surgeon: Rachael Fee, MD;  Location: [facility];  Service: Endoscopy;  Laterality: N/A;  . FLEXIBLE SIGMOIDOSCOPY N/A 04/17/2016   Procedure: FLEXIBLE SIGMOIDOSCOPY;  Surgeon: Meryl Dare, MD;  Location: [facility];  Service: Endoscopy;  Laterality: N/A;  . FOOT SURGERY     benign tumors from foot  . NOSE SURGERY    . SHOULDER SURGERY  2001   left clavicle  excision and acromioplasty  . TOTAL HIP ARTHROPLASTY      Social History:  reports that she quit smoking about 51 years ago. She has never used smokeless tobacco. She reports that she does not drink alcohol or use drugs.  Allergies:  Allergies  Allergen Reactions  . Aspirin Other (See Comments)    G.I. Upset only    Medications Prior to Admission  Medication Sig Dispense  Refill  . amLODipine (NORVASC) 2.5 MG tablet Take 2.5 mg by mouth every morning. Hold for BP < 100/60 or HR < 60    . aspirin EC 81 MG tablet Take 81 mg by mouth daily.    . baclofen (LIORESAL) 10 MG tablet Take 5 mg by mouth at bedtime.    . Biotin 5 MG/ML LIQD Take 5 mLs by mouth 2 (two) times daily.     . carvedilol (COREG) 3.125 MG tablet Take 3.125 mg by mouth daily.    . cholecalciferol (VITAMIN D) 1000 UNITS tablet Take 1,000 Units by mouth every morning.     . Cranberry 475 MG CAPS Take 1 capsule by mouth 2 (two) times daily.     Marland Kitchen docusate sodium (COLACE) 100 MG capsule Take 200 mg by mouth at bedtime.     . dorzolamide (TRUSOPT) 2 % ophthalmic solution Place 1 drop into both eyes 3 (three) times daily. Reported on 11/16/2015        0800,1400,2000    . escitalopram (LEXAPRO) 10 MG tablet Take 1 tablet (10 mg total) by mouth daily. 90 tablet 11  . ferrous sulfate 325 (65 FE) MG tablet Take 325 mg by mouth daily with breakfast.    . fluticasone (FLONASE) 50 MCG/ACT nasal spray Place 2 sprays into both nostrils at bedtime.    Marland Kitchen ipratropium-albuterol (DUONEB) 0.5-2.5 (3) MG/3ML SOLN Take 3 mLs by nebulization daily as needed (wheezing).    Marland Kitchen lidocaine (LIDODERM) 5 % Place 1 patch onto the skin daily. Apply 1 patch to left lower back/hip every morning.  Remove & Discard patch within 12 hours or as directed by MD 5 patch 0  . Linaclotide (LINZESS) 145 MCG CAPS capsule Take 145 mcg by mouth daily before breakfast.     . loratadine (CLARITIN) 10 MG tablet Take 10 mg by mouth daily before breakfast.     . Multiple Vitamins-Minerals (CERTAGEN PO) Take 1 tablet by mouth daily.     . OXYGEN Inhale 2 L into the lungs continuous.    . polyethylene glycol (MIRALAX / GLYCOLAX) packet Take 17 g by mouth daily at 6 (six) AM.     . potassium chloride SA (K-DUR,KLOR-CON) 20 MEQ tablet Take 20 mEq by mouth daily.     Marland Kitchen PROTONIX 40 MG PACK Take 40 mg by mouth daily at 6 (six) AM.    . senna (SENOKOT) 8.6 MG  TABS tablet Take 1 tablet (8.6 mg total) by mouth 2 (two) times daily. 120 each 0  . Skin Protectants, Misc. (CALAZIME SKIN PROTECTANT EX) Apply 1 application topically daily. Buttocks protection    . torsemide (DEMADEX) 20 MG tablet Take 20 mg by mouth daily.    . Travoprost, BAK Free, (TRAVATAN) 0.004 % SOLN ophthalmic solution Place 1 drop into both eyes daily at 8 pm.     . vitamin B-12 (CYANOCOBALAMIN) 500 MCG tablet Take 500 mcg by mouth every morning.     . benzocaine-menthol (CHLORASEPTIC) 6-10 MG lozenge Take 1 lozenge by mouth as needed for sore throat.    Marland Kitchen  ondansetron (ZOFRAN) 8 MG tablet Take 8 mg by mouth every 8 (eight) hours as needed for nausea or vomiting.    . sodium chloride (OCEAN) 0.65 % SOLN nasal spray Place 1 spray into both nostrils as directed. Four times daily And every 24 hours as needed to moisturize nasal passages.      Blood pressure 129/63, pulse (!) 54, temperature 97.8 F (36.6 C), temperature source Oral, resp. rate 14, height $RemoveBe'5\' 5"'[id number]$  (1.651 m), weight 145 lb (65.8 kg), SpO2 100 %. Physical Exam: General: pleasantly demented, WD/WN white female who is laying in bed in NAD HEENT: head is normocephalic, atraumatic. Mouth is pink and moist Heart: irregularly iregular.  No obvious murmurs, gallops, or rubs noted.  Palpable pedal pulses bilaterally Lungs: CTAB, no wheezes, rhonchi, or rales noted.  Respiratory effort nonlabored Abd: soft, non-tender, mild distention, +BS, no masses, hernias, or organomegaly MS: all 4 extremities are symmetrical with mild lower extremity edema bilaterally Skin: warm and dry with no masses, lesions, or rashes Psych: A&Ox2 - oriented to person and place, disoriented to time (6295)  Neuro: normal speech  Results for orders placed or performed during the hospital encounter of 04/17/16 (from the past 48 hour(s))  Comprehensive metabolic panel     Status: Abnormal   Collection Time: 04/17/16  3:51 PM  Result Value Ref Range   Sodium  141 135 - 145 mmol/L   Potassium 3.7 3.5 - 5.1 mmol/L   Chloride 109 101 - 111 mmol/L   CO2 25 22 - 32 mmol/L   Glucose, Bld 178 (H) 65 - 99 mg/dL   BUN 19 6 - 20 mg/dL   Creatinine, Ser 1.22 (H) 0.44 - 1.00 mg/dL   Calcium 9.1 8.9 - 10.3 mg/dL   Total Protein 7.2 6.5 - 8.1 g/dL   Albumin 3.8 3.5 - 5.0 g/dL   AST 20 15 - 41 U/L   ALT 12 (L) 14 - 54 U/L   Alkaline Phosphatase 89 38 - 126 U/L   Total Bilirubin 0.8 0.3 - 1.2 mg/dL   GFR calc non Af Amer 38 (L) >60 mL/min   GFR calc Af Amer 44 (L) >60 mL/min    Comment: (NOTE) The eGFR has been calculated using the CKD EPI equation. This calculation has not been validated in all clinical situations. eGFR's persistently <60 mL/min signify possible Chronic Kidney Disease.    Anion gap 7 5 - 15  CBC with Differential     Status: Abnormal   Collection Time: 04/17/16  3:51 PM  Result Value Ref Range   WBC 5.0 4.0 - 10.5 K/uL   RBC 3.31 (L) 3.87 - 5.11 MIL/uL   Hemoglobin 11.1 (L) 12.0 - 15.0 g/dL   HCT 33.3 (L) 36.0 - 46.0 %   MCV 100.6 (H) 78.0 - 100.0 fL   MCH 33.5 26.0 - 34.0 pg   MCHC 33.3 30.0 - 36.0 g/dL   RDW 13.9 11.5 - 15.5 %   Platelets 119 (L) 150 - 400 K/uL    Comment: SPECIMEN CHECKED FOR CLOTS REPEATED TO VERIFY PLATELET COUNT CONFIRMED BY SMEAR    Neutrophils Relative % 60 %   Neutro Abs 3.0 1.7 - 7.7 K/uL   Lymphocytes Relative 28 %   Lymphs Abs 1.4 0.7 - 4.0 K/uL   Monocytes Relative 8 %   Monocytes Absolute 0.4 0.1 - 1.0 K/uL   Eosinophils Relative 3 %   Eosinophils Absolute 0.2 0.0 - 0.7 K/uL   Basophils Relative 1 %  Basophils Absolute 0.0 0.0 - 0.1 K/uL  I-stat troponin, ED     Status: None   Collection Time: 04/17/16  4:01 PM  Result Value Ref Range   Troponin i, poc 0.01 0.00 - 0.08 ng/mL   Comment 3            Comment: Due to the release kinetics of cTnI, a negative result within the first hours of the onset of symptoms does not rule out myocardial infarction with certainty. If myocardial  infarction is still suspected, repeat the test at appropriate intervals.   I-Stat CG4 Lactic Acid, ED     Status: None   Collection Time: 04/17/16  4:17 PM  Result Value Ref Range   Lactic Acid, Venous 1.17 0.5 - 1.9 mmol/L  MRSA PCR Screening     Status: None   Collection Time: 04/17/16 11:40 PM  Result Value Ref Range   MRSA by PCR NEGATIVE NEGATIVE    Comment:        The GeneXpert MRSA Assay (FDA approved for NASAL specimens only), is one component of a comprehensive MRSA colonization surveillance program. It is not intended to diagnose MRSA infection nor to guide or monitor treatment for MRSA infections.   Glucose, capillary     Status: Abnormal   Collection Time: 04/18/16 12:12 AM  Result Value Ref Range   Glucose-Capillary 137 (H) 65 - 99 mg/dL  Glucose, capillary     Status: Abnormal   Collection Time: 04/18/16  4:12 AM  Result Value Ref Range   Glucose-Capillary 126 (H) 65 - 99 mg/dL  Magnesium     Status: None   Collection Time: 04/18/16  4:48 AM  Result Value Ref Range   Magnesium 1.7 1.7 - 2.4 mg/dL  Phosphorus     Status: None   Collection Time: 04/18/16  4:48 AM  Result Value Ref Range   Phosphorus 3.7 2.5 - 4.6 mg/dL  TSH     Status: None   Collection Time: 04/18/16  4:48 AM  Result Value Ref Range   TSH 2.572 0.350 - 4.500 uIU/mL    Comment: Performed by a 3rd Generation assay with a functional sensitivity of <=0.01 uIU/mL.  Comprehensive metabolic panel     Status: Abnormal   Collection Time: 04/18/16  4:48 AM  Result Value Ref Range   Sodium 141 135 - 145 mmol/L   Potassium 2.9 (L) 3.5 - 5.1 mmol/L    Comment: DELTA CHECK NOTED   Chloride 112 (H) 101 - 111 mmol/L   CO2 24 22 - 32 mmol/L   Glucose, Bld 124 (H) 65 - 99 mg/dL   BUN 17 6 - 20 mg/dL   Creatinine, Ser 1.20 (H) 0.44 - 1.00 mg/dL   Calcium 8.5 (L) 8.9 - 10.3 mg/dL   Total Protein 5.8 (L) 6.5 - 8.1 g/dL   Albumin 3.2 (L) 3.5 - 5.0 g/dL   AST 17 15 - 41 U/L   ALT 10 (L) 14 - 54 U/L    Alkaline Phosphatase 69 38 - 126 U/L   Total Bilirubin 0.6 0.3 - 1.2 mg/dL   GFR calc non Af Amer 39 (L) >60 mL/min   GFR calc Af Amer 45 (L) >60 mL/min    Comment: (NOTE) The eGFR has been calculated using the CKD EPI equation. This calculation has not been validated in all clinical situations. eGFR's persistently <60 mL/min signify possible Chronic Kidney Disease.    Anion gap 5 5 - 15  CBC  Status: Abnormal   Collection Time: 04/18/16  4:48 AM  Result Value Ref Range   WBC 4.0 4.0 - 10.5 K/uL   RBC 2.85 (L) 3.87 - 5.11 MIL/uL   Hemoglobin 9.3 (L) 12.0 - 15.0 g/dL   HCT 50.5 (L) 18.3 - 35.8 %   MCV 101.1 (H) 78.0 - 100.0 fL   MCH 32.6 26.0 - 34.0 pg   MCHC 32.3 30.0 - 36.0 g/dL   RDW 25.1 89.8 - 42.1 %   Platelets 93 (L) 150 - 400 K/uL    Comment: CONSISTENT WITH PREVIOUS RESULT  Glucose, capillary     Status: Abnormal   Collection Time: 04/18/16  7:25 AM  Result Value Ref Range   Glucose-Capillary 110 (H) 65 - 99 mg/dL  Glucose, capillary     Status: Abnormal   Collection Time: 04/18/16  2:02 PM  Result Value Ref Range   Glucose-Capillary 109 (H) 65 - 99 mg/dL   Ct Abdomen Pelvis Wo Contrast  Result Date: 04/17/2016 CLINICAL DATA:  Abdominal pain for 3 days, abdominal distention, absent bowel sounds. Bowel movement yesterday. History of small bowel obstruction and volvulus. EXAM: CT ABDOMEN AND PELVIS WITHOUT CONTRAST TECHNIQUE: Multidetector CT imaging of the abdomen and pelvis was performed following the standard protocol without IV contrast. COMPARISON:  CT abdomen and pelvis March 06, 2016 FINDINGS: LOWER CHEST: Similar bronchial wall thickening. The heart is enlarged with small pericardial effusion. Severe coronary artery and mitral annular calcifications. HEPATOBILIARY: The liver is normal, status post cholecystectomy. PANCREAS: Atrophic pancreas, otherwise unremarkable. SPLEEN: Normal. ADRENALS/URINARY TRACT: Horseshoe kidney. 2 mm LEFT lower pole nephrolithiasis  without hydronephrosis. Limited assessment for renal mass by noncontrast CT. Urinary bladder is well distended, unremarkable. Normal adrenal glands. STOMACH/BOWEL: Sigmoid volvulus with swirled mesentery, proximal sigmoid and gas distended, 8.8 cm in transaxial dimension. Rectal air-fluid level. Small air-filled duodenum diverticulum. VASCULAR/LYMPHATIC: Aortoiliac vessels are normal in course and caliber, severe calcific atherosclerosis. No lymphadenopathy by CT size criteria. REPRODUCTIVE: Normal. OTHER: No intraperitoneal free fluid or free air. MUSCULOSKELETAL: Non-acute. Status post LEFT hip total arthroplasty. Osteopenia. Multilevel severe facet arthropathy. Anterior pelvic wall scarring. IMPRESSION: Sigmoid volvulus. 2 mm nonobstructing LEFT nephrolithiasis.  Horseshoe kidney. Severe atherosclerosis. Acute findings discussed with and reconfirmed by [facility]ES on 04/17/2016 at 5:28 pm. Electronically Signed   By: Awilda Metro M.D.   On: 04/17/2016 17:28   Dg Abd 2 Views  Result Date: 04/18/2016 CLINICAL DATA:  Abdominal pain and distention for several weeks. Evidence for a sigmoid volvulus on recent CT. EXAM: ABDOMEN - 2 VIEW COMPARISON:  12/20/2015 and abdominal CT 04/17/2016 FINDINGS: Again noted are dilated gas-filled loops of sigmoid colon in the right abdomen. The configuration would be compatible with a sigmoid volvulus and not changed from the recent CT. There continues to be gas-filled transverse colon. There is no evidence for free air. Again noted is a left hip arthroplasty. Surgical clips in the right upper abdomen. IMPRESSION: Stable distension of the sigmoid colon and the gas pattern would be compatible with a sigmoid volvulus. No change from the recent CT exam. No evidence for free air. Electronically Signed   By: Richarda Overlie M.D.   On: 04/18/2016 10:15   Assessment/Plan Recurrent sigmoid volvulus  High risk surgical candidate - given recurrent nature of volvulus it is not  unreasonable to  perform a laparoscopic vs open sigmoid colectomy with colostomy. Will confirm with MD.  Adam Phenix, [facility] Surgery 04/18/2016, 3:46 PM Pager: [phone] Consults: [phone]  Mon-Fri 7:00 am-4:30 pm Sat-Sun 7:00 am-11:30 am

## 2016-04-18 NOTE — Progress Notes (Signed)
Patient is back on unit after flexible sigmoidoscopy.  Patient is resting quietly.  Will attempt to give daily medication when patient is awake and can safely swallow.

## 2016-04-18 NOTE — Consult Note (Signed)
Consultation Note Date: 04/18/2016   Patient Name: Maria Christian  DOB: 1925-01-12  MRN: [id number]  Age / Sex: 82 y.o., female   PCP: [name], DO   Referring Physician: Rosita Fire, MD  Reason for Consultation: Establishing goals of care, Non pain symptom management and Pain control  Palliative Care Assessment and Plan Summary of Established Goals of Care and Medical Treatment Preferences    Contacts/Participants in Discussion: Primary Decision Maker: Jerolyn Center [phone] HCPOA: Yes  Jerolyn Center, Son, [place], [place] Other Family Members: Maria Christian [phone]- [address] [name] [phone]- [place]  Code Status/Advance Care Planning:  DNR  Son Maria Christian is HCPOA  Continue current medical managment  Symptom Management:   Constipation: Colace 200 mg QPM, Miralax 17 g QD, Linzess 145 mcg QD and Senna 8.6 mg BID  Pain: (currently denies) Norco 5-325 mg Q4H prn - may not need now that volvulus is resolved, APAP Q6H prn   N/V: Zofran 4 mg Q6H prn   Anxiety: Denies  Palliative Prophylaxis: Delirium  Additional Recommendations (Limitations, Scope, Preferences):  Surgery Consult to consider if patient is a candidate for Sigmoidectomy with Colostomy placement- family is interested in this as an option  Please coordinate decision making with patient's HCPOA, her son Maria Christian at [phone]  Symptom Management as above  Palliative Care will continue to follow along for assistance in decision making- surgery versus back to ALF with return for recurrent volvulus treated with sigmoidoscopy versus hospice at ALF if comfort care is pursued   Psycho-social/Spiritual:   Support System: Son, Maria Christian, Daughter, Maria Christian, and Daughter, [name]  Desire for further Chaplaincy support: No  Prognosis: < 12 months  Discharge Planning:  [place] for rehab with Palliative care service follow-up; hospice depending on plans for  treatment or for comfort care  Life Limiting Illness: Dementia, Recurrent Volvulus      Chief Complaint/History of Present Illness: Maria Christian is a 82 yo F with PMHx of recurrent volvulus, Dementia (FAST score of 5), CAD, CKD III, Anemia of Chronic Disease, Chronic Atrial Fibrillation, Chronic HFpEF, and T2DM who presented to the hospital on 04/17/16 with complaint of abdominal pain and distention and was found to have recurrent sigmoid volvulus. This is a recurrent issue for her with previous episodes in December 2014 and July 2017. Both times she was successfully treated with reduction via sigmoidoscopy. In July 2017, patient was deemed a poor surgical candidate, but surgery was not recommended at that time as patient had improved with sigmoidoscopy. This admission, patient was reduced on 11/13, but required repeat reduction with sigmoidoscopy on 11/14 given recurrence. I talked with Dr. Hilarie Fredrickson with GI who felt her recurrent volvulus is due to a mechanical issue (floppy colon), although prevention of constipation could help prevent these reoccurrences as well. However, it is likely to occur again in the future.   I talked with patient's daughter- Maria Christian, who lives in [place] and sees her mother on a daily basis. At baseline, patient has dementia, but is able to carry on intelligible conversations. She is bedbound/wheelchair bound as she stopped walking after her hip fracture 3-4 years ago. She is able to participate in her ADLs with minimal assistance. She eats and drinks well. She does not have frequent complaints and her medical issues seem to be under good control other than her recurrent volvulus. She has a good quality of life. Maria Christian wants to talk with her mother more about what she would want. I discussed with Maria Christian that  at this point, I feel we have three options:  1. The most aggressive route would be to obtain a surgical consult for consideration of sigmoid colectomy with placement of a  colostomy to help prevent these recurrent volvuloses. Surgery may or may not feel she is a surgical candidate. Patient may or may not decline this option.  2. We treat her with sigmoidoscopy reductions when she has a recurrent volvulus. This creates a risk for perforation and may be unpleasant for the patient to experience recurrent admission and scopes.  3. The most conservative route would be to focus on comfort- patient would be discharged to her living facility with a focus on comfort. At this point, I do not feel that she qualifies for hospice, but if she were to forgo medical management for recurrent volvulus, then hospice at her living facility would be reasonable.   I discussed the above with Maria Christian, her son and HCPOA, who at this time was focusing on options 1 and 2 and would like a surgical consult to see if she was a surgical candidate. However, he wants to discuss these options more fully with his mother and siblings prior to making a decision.  Primary Diagnoses  Present on Admission: . Anemia of chronic disease . Chronic atrial fibrillation ([place]) . Chronic pain . Chronic respiratory failure ([place]) . CKD (chronic kidney disease) stage 3, GFR 30-59 ml/min . Dementia without behavioral disturbance . Coronary artery disease . Diastolic CHF, chronic ([place]) . Sigmoid volvulus ([place])   Palliative Review of Systems: A complete ROS was negative except as per HPI.   I have reviewed the medical record, interviewed the patient and family, and examined the patient. The following aspects are pertinent.  Past Medical History:  Diagnosis Date  . Altered mental status   . Anemia   . Atrial fibrillation ([employer])   . Constipation 02/27/2015  . Coronary artery disease   . DEMENTIA   . Depression   . Diabetes mellitus   . Edema of lower extremity 07/13/11   right leg more swollen than left leg  . Encephalopathy   . Enlarged heart   . Esophageal dysmotility 07/02/12  . GERD (gastroesophageal  reflux disease)   . History of adenomatous polyp of colon 06/24/99  . Horseshoe kidney   . Hyperlipemia   . Hypertension   . Pancreatic lesion 05/22/11   no further workup  per PCP/family due to age  . Parkinson disease ([place])   . Peripheral neuropathy ([place])   . Sigmoid volvulus ([place]) 05/24/2013  . Thrombophlebitis   . TIA (transient ischemic attack) 12/19/2012   Social History   Social History  . Marital status: Widowed    Spouse name: N/A  . Number of children: 5  . Years of education: 10th   Occupational History  . Retired    Social History Main Topics  . Smoking status: Former Smoker    Quit date: 10/09/1964  . Smokeless tobacco: Never Used  . Alcohol use No  . Drug use: No  . Sexual activity: No   Other Topics Concern  . None   Social History Narrative   Pt lives at [facility] and [place].   Caffeine Use: very small amount daily   Family History  Problem Relation Age of Onset  . Cancer    . Heart disease     Scheduled Meds: . amLODipine  2.5 mg Oral Daily  . aspirin EC  81 mg Oral Daily  . baclofen  5  mg Oral QHS  . carvedilol  3.125 mg Oral Daily  . docusate sodium  200 mg Oral QHS  . dorzolamide  1 drop Both Eyes TID  . escitalopram  10 mg Oral Daily  . feeding supplement (ENSURE ENLIVE)  237 mL Oral BID BM  . insulin aspart  0-9 Units Subcutaneous Q4H  . latanoprost  1 drop Both Eyes QHS  . linaclotide  145 mcg Oral QAC breakfast  . pantoprazole  40 mg Oral Q0600  . polyethylene glycol  17 g Oral Q0600  . potassium chloride (KCL MULTIRUN) 30 mEq in 265 mL IVPB  30 mEq Intravenous Once  . senna  1 tablet Oral BID  . sodium chloride flush  3 mL Intravenous Q12H   Continuous Infusions: . sodium chloride 0.45 % with kcl 100 mL/hr at 04/18/16 1412   PRN Meds:.acetaminophen **OR** acetaminophen, HYDROcodone-acetaminophen, ipratropium-albuterol, menthol-cetylpyridinium, ondansetron **OR** ondansetron (ZOFRAN) IV Medications Prior to Admission:    Prior to Admission medications   Medication Sig Start Date End Date Taking? Authorizing Provider  amLODipine (NORVASC) 2.5 MG tablet Take 2.5 mg by mouth every morning. Hold for BP < 100/60 or HR < 60   Yes Historical Provider, MD  aspirin EC 81 MG tablet Take 81 mg by mouth daily.   Yes Historical Provider, MD  baclofen (LIORESAL) 10 MG tablet Take 5 mg by mouth at bedtime.   Yes Historical Provider, MD  Biotin 5 MG/ML LIQD Take 5 mLs by mouth 2 (two) times daily.    Yes Historical Provider, MD  carvedilol (COREG) 3.125 MG tablet Take 3.125 mg by mouth daily.   Yes Historical Provider, MD  cholecalciferol (VITAMIN D) 1000 UNITS tablet Take 1,000 Units by mouth every morning.    Yes Historical Provider, MD  Cranberry 475 MG CAPS Take 1 capsule by mouth 2 (two) times daily.    Yes Historical Provider, MD  docusate sodium (COLACE) 100 MG capsule Take 200 mg by mouth at bedtime.    Yes Historical Provider, MD  dorzolamide (TRUSOPT) 2 % ophthalmic solution Place 1 drop into both eyes 3 (three) times daily. Reported on 11/16/2015        0800,1400,2000   Yes Historical Provider, MD  escitalopram (LEXAPRO) 10 MG tablet Take 1 tablet (10 mg total) by mouth daily. 12/03/14  Yes Gerlene Fee, NP  ferrous sulfate 325 (65 FE) MG tablet Take 325 mg by mouth daily with breakfast.   Yes Historical Provider, MD  fluticasone (FLONASE) 50 MCG/ACT nasal spray Place 2 sprays into both nostrils at bedtime.   Yes Historical Provider, MD  ipratropium-albuterol (DUONEB) 0.5-2.5 (3) MG/3ML SOLN Take 3 mLs by nebulization daily as needed (wheezing).   Yes Historical Provider, MD  lidocaine (LIDODERM) 5 % Place 1 patch onto the skin daily. Apply 1 patch to left lower back/hip every morning.  Remove & Discard patch within 12 hours or as directed by MD 07/28/13  Yes Thurnell Lose, MD  Linaclotide (LINZESS) 145 MCG CAPS capsule Take 145 mcg by mouth daily before breakfast.    Yes Historical Provider, MD  loratadine  (CLARITIN) 10 MG tablet Take 10 mg by mouth daily before breakfast.    Yes Historical Provider, MD  Multiple Vitamins-Minerals (CERTAGEN PO) Take 1 tablet by mouth daily.    Yes Historical Provider, MD  OXYGEN Inhale 2 L into the lungs continuous.   Yes Historical Provider, MD  polyethylene glycol (MIRALAX / GLYCOLAX) packet Take 17 g by mouth daily at  6 (six) AM.    Yes Historical Provider, MD  potassium chloride SA (K-DUR,KLOR-CON) 20 MEQ tablet Take 20 mEq by mouth daily.  10/23/13  Yes Janece Canterbury, MD  PROTONIX 40 MG PACK Take 40 mg by mouth daily at 6 (six) AM. 12/06/15  Yes Historical Provider, MD  senna (SENOKOT) 8.6 MG TABS tablet Take 1 tablet (8.6 mg total) by mouth 2 (two) times daily. 01/05/14  Yes Barton Dubois, MD  Skin Protectants, Misc. (CALAZIME SKIN PROTECTANT EX) Apply 1 application topically daily. Buttocks protection   Yes Historical Provider, MD  torsemide (DEMADEX) 20 MG tablet Take 20 mg by mouth daily.   Yes Historical Provider, MD  Travoprost, BAK Free, (TRAVATAN) 0.004 % SOLN ophthalmic solution Place 1 drop into both eyes daily at 8 pm.    Yes Historical Provider, MD  vitamin B-12 (CYANOCOBALAMIN) 500 MCG tablet Take 500 mcg by mouth every morning.    Yes Historical Provider, MD  benzocaine-menthol (CHLORASEPTIC) 6-10 MG lozenge Take 1 lozenge by mouth as needed for sore throat.    Historical Provider, MD  ondansetron (ZOFRAN) 8 MG tablet Take 8 mg by mouth every 8 (eight) hours as needed for nausea or vomiting.    Historical Provider, MD  sodium chloride (OCEAN) 0.65 % SOLN nasal spray Place 1 spray into both nostrils as directed. Four times daily And every 24 hours as needed to moisturize nasal passages.    Historical Provider, MD   Allergies  Allergen Reactions  . Aspirin Other (See Comments)    G.I. Upset only   CBC:    Component Value Date/Time   WBC 4.0 04/18/2016 0448   HGB 9.3 (L) 04/18/2016 0448   HCT 28.8 (L) 04/18/2016 0448   PLT 93 (L) 04/18/2016 0448     MCV 101.1 (H) 04/18/2016 0448   NEUTROABS 3.0 04/17/2016 1551   LYMPHSABS 1.4 04/17/2016 1551   MONOABS 0.4 04/17/2016 1551   EOSABS 0.2 04/17/2016 1551   BASOSABS 0.0 04/17/2016 1551   Comprehensive Metabolic Panel:    Component Value Date/Time   NA 141 04/18/2016 0448   NA 143 04/07/2016   K 2.9 (L) 04/18/2016 0448   CL 112 (H) 04/18/2016 0448   CO2 24 04/18/2016 0448   BUN 17 04/18/2016 0448   BUN 18 04/07/2016   CREATININE 1.20 (H) 04/18/2016 0448   GLUCOSE 124 (H) 04/18/2016 0448   CALCIUM 8.5 (L) 04/18/2016 0448   AST 17 04/18/2016 0448   ALT 10 (L) 04/18/2016 0448   ALKPHOS 69 04/18/2016 0448   BILITOT 0.6 04/18/2016 0448   PROT 5.8 (L) 04/18/2016 0448   ALBUMIN 3.2 (L) 04/18/2016 0448    Physical Exam: Vitals:   04/18/16 1150 04/18/16 1200 04/18/16 1210 04/18/16 1357  BP: (!) 153/52  (!) 149/53 129/63  Pulse:  64  (!) 54  Resp: 12   14  Temp:    97.8 F (36.6 C)  TempSrc:    Oral  SpO2:    100%  Weight:      Height:       General: Vital signs reviewed.  Patient is elderly, in no acute distress and cooperative with exam.  Cardiovascular: Bradycardic, irregularly irregular, 2/6 systolic murmur. Pulmonary/Chest: Clear to auscultation bilaterally, no wheezes, rales, or rhonchi. Abdominal: Soft, non-tender, non-distended, BS +, no guarding present.  Extremities: Trace lower extremity edema bilaterally, pulses symmetric and intact bilaterally.  Neurological: Awake, alert & oriented x2 Skin: Warm, dry and intact. No rashes or erythema. Psychiatric: Normal  mood and affect. speech and behavior is normal. Cognition and memory are abnormal.   Intake/output summary:   Intake/Output Summary (Last 24 hours) at 04/18/16 1527 Last data filed at 04/18/16 0900  Gross per 24 hour  Intake             1425 ml  Output                0 ml  Net             1425 ml   LBM: Last BM Date: 04/18/16 Baseline Weight: Weight: 145 lb (65.8 kg) Most recent weight: Weight: 145 lb  (65.8 kg)                       Palliative Performance Scale: 50% Additional Data Reviewed: Recent Labs     04/17/16  1551  04/18/16  0448  WBC  5.0  4.0  HGB  11.1*  9.3*  PLT  119*  93*  NA  141  141  BUN  19  17  CREATININE  1.22*  1.20*    Time In: 1:40 pm Time Out: 2:35 pm Time Total: 55 minutes  Greater than 50%  of this time was spent counseling and coordinating care related to the above assessment and plan. Time was spent discussing prognosis, goals of care, and symptom management.   Signed by: Martyn Malay, DO PGY-3 Internal Medicine Resident Pager # [phone] 04/18/2016 3:27 PM  Please contact Palliative Medicine Team phone at [phone] for questions and concerns.

## 2016-04-19 ENCOUNTER — Inpatient Hospital Stay (HOSPITAL_COMMUNITY): Payer: Medicare Other

## 2016-04-19 DIAGNOSIS — F0280 Dementia in other diseases classified elsewhere without behavioral disturbance: Secondary | ICD-10-CM

## 2016-04-19 LAB — GLUCOSE, CAPILLARY
GLUCOSE-CAPILLARY: 109 mg/dL — AB (ref 65–99)
Glucose-Capillary: 100 mg/dL — ABNORMAL HIGH (ref 65–99)
Glucose-Capillary: 123 mg/dL — ABNORMAL HIGH (ref 65–99)
Glucose-Capillary: 211 mg/dL — ABNORMAL HIGH (ref 65–99)
Glucose-Capillary: 139 mg/dL — ABNORMAL HIGH (ref 65–99)

## 2016-04-19 LAB — CBC
HEMATOCRIT: 29.5 % — AB (ref 36.0–46.0)
Hemoglobin: 9.7 g/dL — ABNORMAL LOW (ref 12.0–15.0)
MCH: 34.3 pg — AB (ref 26.0–34.0)
MCHC: 32.9 g/dL (ref 30.0–36.0)
MCV: 104.2 fL — AB (ref 78.0–100.0)
Platelets: 95 10*3/uL — ABNORMAL LOW (ref 150–400)
RBC: 2.83 MIL/uL — ABNORMAL LOW (ref 3.87–5.11)
RDW: 14.5 % (ref 11.5–15.5)
WBC: 4.2 10*3/uL (ref 4.0–10.5)

## 2016-04-19 LAB — BASIC METABOLIC PANEL
Anion gap: 4 — ABNORMAL LOW (ref 5–15)
BUN: 15 mg/dL (ref 6–20)
CO2: 23 mmol/L (ref 22–32)
Calcium: 8.6 mg/dL — ABNORMAL LOW (ref 8.9–10.3)
Chloride: 113 mmol/L — ABNORMAL HIGH (ref 101–111)
Creatinine, Ser: 1.16 mg/dL — ABNORMAL HIGH (ref 0.44–1.00)
GFR calc Af Amer: 47 mL/min — ABNORMAL LOW (ref >60)
GFR, EST NON AFRICAN AMERICAN: 40 mL/min — AB (ref >60)
GLUCOSE: 108 mg/dL — AB (ref 65–99)
POTASSIUM: 3.8 mmol/L (ref 3.5–5.1)
Sodium: 140 mmol/L (ref 135–145)

## 2016-04-19 LAB — HEMOGLOBIN A1C
Hgb A1c MFr Bld: 6.9 % — ABNORMAL HIGH (ref 4.8–5.6)
Mean Plasma Glucose: 151 mg/dL

## 2016-04-19 MED ORDER — KCL-LACTATED RINGERS 20 MEQ/L IV SOLN
INTRAVENOUS | Status: DC
  Filled 2016-04-19: qty 1000

## 2016-04-19 MED ORDER — POTASSIUM CHLORIDE 2 MEQ/ML IV SOLN
INTRAVENOUS | Status: DC
  Administered 2016-04-19 – 2016-04-20 (×3): via INTRAVENOUS
  Filled 2016-04-19 (×5): qty 1000

## 2016-04-19 MED ORDER — GUAIFENESIN-DM 100-10 MG/5ML PO SYRP
5.0000 mL | ORAL_SOLUTION | ORAL | Status: DC | PRN
  Administered 2016-04-19 – 2016-04-23 (×7): 5 mL via ORAL
  Filled 2016-04-19 (×8): qty 10

## 2016-04-19 NOTE — Progress Notes (Signed)
Patient ID: Maria Christian, female   DOB: [birth year], 82 y.o.   MRN: [id number]    Progress Note   Subjective   Comfortable today, denies any pain, just had large BM    Objective   Vital signs in last 24 hours: Temp:  [97.8 F (36.6 C)-98.9 F (37.2 C)] 98.3 F (36.8 C) (11/15 0503) Pulse Rate:  [50-74] 74 (11/15 0503) Resp:  [12-18] 18 (11/15 0503) BP: (119-153)/(49-63) 123/49 (11/15 0503) SpO2:  [97 %-100 %] 97 % (11/15 0503) Last BM Date: 04/18/16 General:  Elderly   white female in NAD Heart:  Regular rate and rhythm; no murmurs Lungs: Respirations even and unlabored, lungs CTA bilaterally Abdomen:  Soft, nontender and mildly distended. BS hyperactive some tinkling. Extremities:  Without edema. Neurologic:  Alert and oriented,  grossly normal neurologically. Psych:  Cooperative. Normal mood and affect.  Intake/Output from previous day: 11/14 0701 - 11/15 0700 In: 1520 [P.O.:240; I.V.:1280] Out: -  Intake/Output this shift: Total I/O In: 240 [P.O.:240] Out: -   Lab Results:  Recent Labs  04/17/16 1551 04/18/16 0448 04/19/16 0455  WBC 5.0 4.0 4.2  HGB 11.1* 9.3* 9.7*  HCT 33.3* 28.8* 29.5*  PLT 119* 93* 95*   BMET  Recent Labs  04/17/16 1551 04/18/16 0448 04/19/16 0455  NA 141 141 140  K 3.7 2.9* 3.8  CL 109 112* 113*  CO2 25 24 23   GLUCOSE 178* 124* 108*  BUN 19 17 15   CREATININE 1.22* 1.20* 1.16*  CALCIUM 9.1 8.5* 8.6*   LFT  Recent Labs  04/18/16 0448  PROT 5.8*  ALBUMIN 3.2*  AST 17  ALT 10*  ALKPHOS 69  BILITOT 0.6   PT/INR No results for input(s): LABPROT, INR in the last 72 hours.  Studies/Results: Ct Abdomen Pelvis Wo Contrast  Result Date: 04/17/2016 CLINICAL DATA:  Abdominal pain for 3 days, abdominal distention, absent bowel sounds. Bowel movement yesterday. History of small bowel obstruction and volvulus. EXAM: CT ABDOMEN AND PELVIS WITHOUT CONTRAST TECHNIQUE: Multidetector CT imaging of the abdomen and pelvis was  performed following the standard protocol without IV contrast. COMPARISON:  CT abdomen and pelvis March 06, 2016 FINDINGS: LOWER CHEST: Similar bronchial wall thickening. The heart is enlarged with small pericardial effusion. Severe coronary artery and mitral annular calcifications. HEPATOBILIARY: The liver is normal, status post cholecystectomy. PANCREAS: Atrophic pancreas, otherwise unremarkable. SPLEEN: Normal. ADRENALS/URINARY TRACT: Horseshoe kidney. 2 mm LEFT lower pole nephrolithiasis without hydronephrosis. Limited assessment for renal mass by noncontrast CT. Urinary bladder is well distended, unremarkable. Normal adrenal glands. STOMACH/BOWEL: Sigmoid volvulus with swirled mesentery, proximal sigmoid and gas distended, 8.8 cm in transaxial dimension. Rectal air-fluid level. Small air-filled duodenum diverticulum. VASCULAR/LYMPHATIC: Aortoiliac vessels are normal in course and caliber, severe calcific atherosclerosis. No lymphadenopathy by CT size criteria. REPRODUCTIVE: Normal. OTHER: No intraperitoneal free fluid or free air. MUSCULOSKELETAL: Non-acute. Status post LEFT hip total arthroplasty. Osteopenia. Multilevel severe facet arthropathy. Anterior pelvic wall scarring. IMPRESSION: Sigmoid volvulus. 2 mm nonobstructing LEFT nephrolithiasis.  Horseshoe kidney. Severe atherosclerosis. Acute findings discussed with and reconfirmed by [facility] on 04/17/2016 at 5:28 pm. Electronically Signed   By: Elon Alas M.D.   On: 04/17/2016 17:28   Dg Abd 2 Views  Result Date: 04/18/2016 CLINICAL DATA:  Abdominal pain and distention for several weeks. Evidence for a sigmoid volvulus on recent CT. EXAM: ABDOMEN - 2 VIEW COMPARISON:  12/20/2015 and abdominal CT 04/17/2016 FINDINGS: Again noted are dilated gas-filled loops of sigmoid colon in the  right abdomen. The configuration would be compatible with a sigmoid volvulus and not changed from the recent CT. There continues to be gas-filled transverse  colon. There is no evidence for free air. Again noted is a left hip arthroplasty. Surgical clips in the right upper abdomen. IMPRESSION: Stable distension of the sigmoid colon and the gas pattern would be compatible with a sigmoid volvulus. No change from the recent CT exam. No evidence for free air. Electronically Signed   By: Markus Daft M.D.   On: 04/18/2016 10:15       Assessment / Plan:    #1 82 yo female with recurrent sigmoid volvulus- s/p decompression on 11/13 and repeated yesterday am . NO current pain or significant distention-  Will  do follow up abdominal  films today .  Dr Hilarie Fredrickson has had several conversations with pt's children  Regarding possible surgery to prevent recurrence- they are to decide today- had wanted to discuss with patient as well  Allow diet, and continue bowel regimen   #2 hypokalemia- corrected  #3 chronic anemia, mild thrombocytopenia #4 CKD #5 CAD #6 CHF #7 AODM    Active Problems:   Diastolic CHF, chronic ([facility])   Coronary artery disease   Dementia without behavioral disturbance   Anemia of chronic disease   Chronic atrial fibrillation ([facility])   CKD (chronic kidney disease) stage 3, GFR 30-59 ml/min   Chronic respiratory failure ([facility])   Chronic pain   Sigmoid volvulus ([place])   DM (diabetes mellitus), type 2 ([place])     LOS: 2 days   [name]  04/19/2016, 10:07 AM

## 2016-04-19 NOTE — Progress Notes (Signed)
PROGRESS NOTE    Maria Christian  [id number] DOB: 1925-01-15 DOA: 04/17/2016 PCP: [name], DO   Brief Narrative: 82 y.o.femaleSNF resident, bed to wheelchair bound,  dementia, Parkinson's disease, atrial fibrillation not on anticoagulation, HTN, diastolic CHF, CAD, GERD, CKD, DM2,  recurrent volvulus presented with 2 days hx of abdominal pain Nausea and vomiting. Found to have volvulus status post decompression by gastroenterologist.  Assessment & Plan:   #Recurrent sigmoid volvulus:  -. Status post sigmoidoscopy and decompression on admission on 11/13. Repeat abdominal x-ray today showed distention of sigmoid colon which is compatible with sigmoid volvulus. Patient underwent second sigmoidoscopy and complete decompression  On 11/14.  -ongoing discussion with [facility], general surgery and palliative care about goals of care, awaiting family decision about  Surgical intervention .   #Hypokalemia: Repleting potassium chloride IV. repeat BMP in the morning. Check mag   #  Chronic atrial fibrillation [facility]): Monitor heart rate. Patient is not on systemic anticoagulant. Continue Coreg.    #Essential hypertension: On low-dose amlodipine and Coreg. Monitor blood pressure.  #CKDIII: cr seems at baseline  #  Dementia without behavioral disturbance/FTT: baseline wheelchair bound for the last 11-12years, long term SNF resident, has sundowning symptoms in the hospital ,  Continue to provide supportive care.    DVT prophylaxis: on aspirin and SCD. Code Status: DO NOT RESUSCITATE Family Communication: multiple family present at bedside Disposition Plan: pending  Consultants:   Gastroenterologist  General surgery  Palliative care  Procedures: sigmoidoscopy twice on 11/13 and 11/14 Antimicrobials: none  Subjective:  patient was seen and examined with multiple family members at bedside.  Patient  Denies abdominal pain, no n/v, report tolerate soft diet and had bm this am  Review  of systems Limited because of her dementia. She is oriented to person , know she is in the hospital, she report it is 2007   Objective: Vitals:   04/18/16 1716 04/18/16 2016 04/19/16 0408 04/19/16 0503  BP: (!) 133/51 (!) 119/50  (!) 123/49  Pulse: 62 (!) 58  74  Resp: 14 16  18   Temp: 98.9 F (37.2 C) 98.4 F (36.9 C)  98.3 F (36.8 C)  TempSrc: Oral Oral  Oral  SpO2: 100% 99% 97% 97%  Weight:      Height:        Intake/Output Summary (Last 24 hours) at 04/19/16 1219 Last data filed at 04/19/16 0800  Gross per 24 hour  Intake             1520 ml  Output                0 ml  Net             1520 ml   Filed Weights   04/18/16 0600  Weight: 65.8 kg (145 lb)    Examination:  General exam: Elderly female lying on bed,Appears calm and comfortable  Respiratory system: Clear to auscultation. Respiratory effort normal. No wheezing or crackle Cardiovascular system: S1 & S2 heard, RRR.  No pedal edema. Gastrointestinal system:  Abdomen soft, no tenderness ,+bs. Central nervous system: A, Awake and following simple commands. Baseline dementia, no oriented to time Extremities: Symmetric 5 x 5 power. Skin: No rashes, lesions or ulcers   Data Reviewed: I have personally reviewed following labs and imaging studies  CBC:  Recent Labs Lab 04/17/16 1551 04/18/16 0448 04/19/16 0455  WBC 5.0 4.0 4.2  NEUTROABS 3.0  --   --   HGB 11.1* 9.3*  9.7*  HCT 33.3* 28.8* 29.5*  MCV 100.6* 101.1* 104.2*  PLT 119* 93* 95*   Basic Metabolic Panel:  Recent Labs Lab 04/17/16 1551 04/18/16 0448 04/19/16 0455  NA 141 141 140  K 3.7 2.9* 3.8  CL 109 112* 113*  CO2 25 24 23   GLUCOSE 178* 124* 108*  BUN 19 17 15   CREATININE 1.22* 1.20* 1.16*  CALCIUM 9.1 8.5* 8.6*  MG  --  1.7  --   PHOS  --  3.7  --    GFR: Estimated Creatinine Clearance: 29.6 mL/min (by C-G formula based on SCr of 1.16 mg/dL (H)). Liver Function Tests:  Recent Labs Lab 04/17/16 1551 04/18/16 0448  AST 20  17  ALT 12* 10*  ALKPHOS 89 69  BILITOT 0.8 0.6  PROT 7.2 5.8*  ALBUMIN 3.8 3.2*   No results for input(s): LIPASE, AMYLASE in the last 168 hours. No results for input(s): AMMONIA in the last 168 hours. Coagulation Profile: No results for input(s): INR, PROTIME in the last 168 hours. Cardiac Enzymes: No results for input(s): CKTOTAL, CKMB, CKMBINDEX, TROPONINI in the last 168 hours. BNP (last 3 results) No results for input(s): PROBNP in the last 8760 hours. HbA1C:  Recent Labs  04/18/16 0448  HGBA1C 6.9*   CBG:  Recent Labs Lab 04/18/16 1958 04/18/16 2351 04/19/16 0343 04/19/16 0730 04/19/16 1135  GLUCAP 138* 90 100* 109* 123*   Lipid Profile: No results for input(s): CHOL, HDL, LDLCALC, TRIG, CHOLHDL, LDLDIRECT in the last 72 hours. Thyroid Function Tests:  Recent Labs  04/18/16 0448  TSH 2.572   Anemia Panel: No results for input(s): VITAMINB12, FOLATE, FERRITIN, TIBC, IRON, RETICCTPCT in the last 72 hours. Sepsis Labs:  Recent Labs Lab 04/17/16 1617  LATICACIDVEN 1.17    Recent Results (from the past 240 hour(s))  MRSA PCR Screening     Status: None   Collection Time: 04/17/16 11:40 PM  Result Value Ref Range Status   MRSA by PCR NEGATIVE NEGATIVE Final    Comment:        The GeneXpert MRSA Assay (FDA approved for NASAL specimens only), is one component of a comprehensive MRSA colonization surveillance program. It is not intended to diagnose MRSA infection nor to guide or monitor treatment for MRSA infections.          Radiology Studies: Ct Abdomen Pelvis Wo Contrast  Result Date: 04/17/2016 CLINICAL DATA:  Abdominal pain for 3 days, abdominal distention, absent bowel sounds. Bowel movement yesterday. History of small bowel obstruction and volvulus. EXAM: CT ABDOMEN AND PELVIS WITHOUT CONTRAST TECHNIQUE: Multidetector CT imaging of the abdomen and pelvis was performed following the standard protocol without IV contrast. COMPARISON:  CT  abdomen and pelvis March 06, 2016 FINDINGS: LOWER CHEST: Similar bronchial wall thickening. The heart is enlarged with small pericardial effusion. Severe coronary artery and mitral annular calcifications. HEPATOBILIARY: The liver is normal, status post cholecystectomy. PANCREAS: Atrophic pancreas, otherwise unremarkable. SPLEEN: Normal. ADRENALS/URINARY TRACT: Horseshoe kidney. 2 mm LEFT lower pole nephrolithiasis without hydronephrosis. Limited assessment for renal mass by noncontrast CT. Urinary bladder is well distended, unremarkable. Normal adrenal glands. STOMACH/BOWEL: Sigmoid volvulus with swirled mesentery, proximal sigmoid and gas distended, 8.8 cm in transaxial dimension. Rectal air-fluid level. Small air-filled duodenum diverticulum. VASCULAR/LYMPHATIC: Aortoiliac vessels are normal in course and caliber, severe calcific atherosclerosis. No lymphadenopathy by CT size criteria. REPRODUCTIVE: Normal. OTHER: No intraperitoneal free fluid or free air. MUSCULOSKELETAL: Non-acute. Status post LEFT hip total arthroplasty. Osteopenia. Multilevel severe facet arthropathy.  Anterior pelvic wall scarring. IMPRESSION: Sigmoid volvulus. 2 mm nonobstructing LEFT nephrolithiasis.  Horseshoe kidney. Severe atherosclerosis. Acute findings discussed with and reconfirmed by [facility] REES on 04/17/2016 at 5:28 pm. Electronically Signed   By: Elon Alas M.D.   On: 04/17/2016 17:28   Dg Abd 2 Views  Result Date: 04/18/2016 CLINICAL DATA:  Abdominal pain and distention for several weeks. Evidence for a sigmoid volvulus on recent CT. EXAM: ABDOMEN - 2 VIEW COMPARISON:  12/20/2015 and abdominal CT 04/17/2016 FINDINGS: Again noted are dilated gas-filled loops of sigmoid colon in the right abdomen. The configuration would be compatible with a sigmoid volvulus and not changed from the recent CT. There continues to be gas-filled transverse colon. There is no evidence for free air. Again noted is a left hip  arthroplasty. Surgical clips in the right upper abdomen. IMPRESSION: Stable distension of the sigmoid colon and the gas pattern would be compatible with a sigmoid volvulus. No change from the recent CT exam. No evidence for free air. Electronically Signed   By: Markus Daft M.D.   On: 04/18/2016 10:15        Scheduled Meds: . amLODipine  2.5 mg Oral Daily  . aspirin EC  81 mg Oral Daily  . baclofen  5 mg Oral QHS  . carvedilol  3.125 mg Oral Daily  . dorzolamide  1 drop Both Eyes TID  . escitalopram  10 mg Oral Daily  . feeding supplement (ENSURE ENLIVE)  237 mL Oral BID BM  . insulin aspart  0-9 Units Subcutaneous Q4H  . latanoprost  1 drop Both Eyes QHS  . linaclotide  145 mcg Oral QAC breakfast  . pantoprazole  40 mg Oral Q0600  . polyethylene glycol  17 g Oral Q0600  . senna  1 tablet Oral BID  . sodium chloride flush  3 mL Intravenous Q12H   Continuous Infusions: . lactated ringers with KCl 20 mEq/L       LOS: 2 days    Time spent: 26 minutes.    Florencia Reasons, MD PhD [facility] Pager [phone]  If 7PM-7AM, please contact night-coverage [email or web address] Password [email or web address] 04/19/2016, 12:19 PM

## 2016-04-19 NOTE — Progress Notes (Signed)
General Surgery [facility]  Assessment & Plan:  Recurrent sigmoid volvulus  Patient comfortable this AM - eating breakfast  Dr. Hilarie Fredrickson to discuss with family today - surgery vs observation & medical management  Will follow and be available if needed  As noted, operative intervention would likely involve sigmoid colectomy and end colostomy.  Risks are elevated given age and comordities, including but not limited to wound infection, poor wound healing or dehiscence, complications of anesthesia, respiratory failure, heart failure, and stroke.  Dr. Hilarie Fredrickson to discuss with patient, daughter, and son (HC-POA) today.  I will be happy to discuss with them if needed.  Alternative to surgery is continued medical management with flex sigmoidoscopy when necessary, from which she has done reasonably well the past 2-3 years.        Earnstine Regal, MD, [facility]       Office: [phone]    Subjective: Patient up in bed, pleasant, no complaints.  Eating breakfast.  Objective: Vital signs in last 24 hours: Temp:  [97.8 F (36.6 C)-98.9 F (37.2 C)] 98.3 F (36.8 C) (11/15 0503) Pulse Rate:  [50-74] 74 (11/15 0503) Resp:  [12-18] 18 (11/15 0503) BP: (119-153)/(49-63) 123/49 (11/15 0503) SpO2:  [97 %-100 %] 97 % (11/15 0503) Last BM Date: 04/18/16  Intake/Output from previous day: 11/14 0701 - 11/15 0700 In: 1520 [P.O.:240; I.V.:1280] Out: -  Intake/Output this shift: No intake/output data recorded.  Physical Exam: HEENT - sclerae clear, mucous membranes moist Neck - soft Abdomen - protuberant, soft, non-tender  Lab Results:   Recent Labs  04/18/16 0448 04/19/16 0455  WBC 4.0 4.2  HGB 9.3* 9.7*  HCT 28.8* 29.5*  PLT 93* 95*   BMET  Recent Labs  04/18/16 0448 04/19/16 0455  NA 141 140  K 2.9* 3.8  CL 112* 113*  CO2 24 23  GLUCOSE 124* 108*  BUN 17 15  CREATININE 1.20* 1.16*  CALCIUM 8.5* 8.6*   PT/INR No  results for input(s): LABPROT, INR in the last 72 hours. Comprehensive Metabolic Panel:    Component Value Date/Time   NA 140 04/19/2016 0455   NA 141 04/18/2016 0448   NA 143 04/07/2016   NA 141 03/11/2016   K 3.8 04/19/2016 0455   K 2.9 (L) 04/18/2016 0448   CL 113 (H) 04/19/2016 0455   CL 112 (H) 04/18/2016 0448   CO2 23 04/19/2016 0455   CO2 24 04/18/2016 0448   BUN 15 04/19/2016 0455   BUN 17 04/18/2016 0448   BUN 18 04/07/2016   BUN 18 03/11/2016   CREATININE 1.16 (H) 04/19/2016 0455   CREATININE 1.20 (H) 04/18/2016 0448   GLUCOSE 108 (H) 04/19/2016 0455   GLUCOSE 124 (H) 04/18/2016 0448   CALCIUM 8.6 (L) 04/19/2016 0455   CALCIUM 8.5 (L) 04/18/2016 0448   AST 17 04/18/2016 0448   AST 20 04/17/2016 1551   ALT 10 (L) 04/18/2016 0448   ALT 12 (L) 04/17/2016 1551   ALKPHOS 69 04/18/2016 0448   ALKPHOS 89 04/17/2016 1551   BILITOT 0.6 04/18/2016 0448   BILITOT 0.8 04/17/2016 1551   PROT 5.8 (L) 04/18/2016 0448   PROT 7.2 04/17/2016 1551   ALBUMIN 3.2 (L) 04/18/2016 0448   ALBUMIN 3.8 04/17/2016 1551    Studies/Results: Ct Abdomen Pelvis Wo Contrast  Result Date: 04/17/2016 CLINICAL DATA:  Abdominal pain for 3 days, abdominal distention, absent bowel sounds. Bowel movement yesterday.  History of small bowel obstruction and volvulus. EXAM: CT ABDOMEN AND PELVIS WITHOUT CONTRAST TECHNIQUE: Multidetector CT imaging of the abdomen and pelvis was performed following the standard protocol without IV contrast. COMPARISON:  CT abdomen and pelvis March 06, 2016 FINDINGS: LOWER CHEST: Similar bronchial wall thickening. The heart is enlarged with small pericardial effusion. Severe coronary artery and mitral annular calcifications. HEPATOBILIARY: The liver is normal, status post cholecystectomy. PANCREAS: Atrophic pancreas, otherwise unremarkable. SPLEEN: Normal. ADRENALS/URINARY TRACT: Horseshoe kidney. 2 mm LEFT lower pole nephrolithiasis without hydronephrosis. Limited assessment  for renal mass by noncontrast CT. Urinary bladder is well distended, unremarkable. Normal adrenal glands. STOMACH/BOWEL: Sigmoid volvulus with swirled mesentery, proximal sigmoid and gas distended, 8.8 cm in transaxial dimension. Rectal air-fluid level. Small air-filled duodenum diverticulum. VASCULAR/LYMPHATIC: Aortoiliac vessels are normal in course and caliber, severe calcific atherosclerosis. No lymphadenopathy by CT size criteria. REPRODUCTIVE: Normal. OTHER: No intraperitoneal free fluid or free air. MUSCULOSKELETAL: Non-acute. Status post LEFT hip total arthroplasty. Osteopenia. Multilevel severe facet arthropathy. Anterior pelvic wall scarring. IMPRESSION: Sigmoid volvulus. 2 mm nonobstructing LEFT nephrolithiasis.  Horseshoe kidney. Severe atherosclerosis. Acute findings discussed with and reconfirmed by [facility] REES on 04/17/2016 at 5:28 pm. Electronically Signed   By: Elon Alas M.D.   On: 04/17/2016 17:28   Dg Abd 2 Views  Result Date: 04/18/2016 CLINICAL DATA:  Abdominal pain and distention for several weeks. Evidence for a sigmoid volvulus on recent CT. EXAM: ABDOMEN - 2 VIEW COMPARISON:  12/20/2015 and abdominal CT 04/17/2016 FINDINGS: Again noted are dilated gas-filled loops of sigmoid colon in the right abdomen. The configuration would be compatible with a sigmoid volvulus and not changed from the recent CT. There continues to be gas-filled transverse colon. There is no evidence for free air. Again noted is a left hip arthroplasty. Surgical clips in the right upper abdomen. IMPRESSION: Stable distension of the sigmoid colon and the gas pattern would be compatible with a sigmoid volvulus. No change from the recent CT exam. No evidence for free air. Electronically Signed   By: Markus Daft M.D.   On: 04/18/2016 10:15      Rozetta Stumpp M 04/19/2016  Patient ID: Maria Christian, female   DOB: November 20, 1924, 82 y.o.   MRN: [id number]

## 2016-04-20 ENCOUNTER — Inpatient Hospital Stay (HOSPITAL_COMMUNITY): Payer: Medicare Other

## 2016-04-20 ENCOUNTER — Encounter (HOSPITAL_COMMUNITY): Payer: Self-pay | Admitting: Internal Medicine

## 2016-04-20 DIAGNOSIS — K5909 Other constipation: Secondary | ICD-10-CM

## 2016-04-20 DIAGNOSIS — K5939 Other megacolon: Secondary | ICD-10-CM

## 2016-04-20 LAB — BASIC METABOLIC PANEL
ANION GAP: 4 — AB (ref 5–15)
BUN: 14 mg/dL (ref 6–20)
CHLORIDE: 114 mmol/L — AB (ref 101–111)
CO2: 25 mmol/L (ref 22–32)
CREATININE: 1 mg/dL (ref 0.44–1.00)
Calcium: 9.2 mg/dL (ref 8.9–10.3)
GFR calc non Af Amer: 48 mL/min — ABNORMAL LOW (ref >60)
GFR, EST AFRICAN AMERICAN: 56 mL/min — AB (ref >60)
GLUCOSE: 103 mg/dL — AB (ref 65–99)
Potassium: 3.7 mmol/L (ref 3.5–5.1)
Sodium: 143 mmol/L (ref 135–145)

## 2016-04-20 LAB — GLUCOSE, CAPILLARY
GLUCOSE-CAPILLARY: 103 mg/dL — AB (ref 65–99)
GLUCOSE-CAPILLARY: 121 mg/dL — AB (ref 65–99)
GLUCOSE-CAPILLARY: 130 mg/dL — AB (ref 65–99)
GLUCOSE-CAPILLARY: 146 mg/dL — AB (ref 65–99)
GLUCOSE-CAPILLARY: 152 mg/dL — AB (ref 65–99)
GLUCOSE-CAPILLARY: 196 mg/dL — AB (ref 65–99)
Glucose-Capillary: 115 mg/dL — ABNORMAL HIGH (ref 65–99)

## 2016-04-20 LAB — CBC
HEMATOCRIT: 30.9 % — AB (ref 36.0–46.0)
HEMOGLOBIN: 10.2 g/dL — AB (ref 12.0–15.0)
MCH: 33 pg (ref 26.0–34.0)
MCHC: 33 g/dL (ref 30.0–36.0)
MCV: 100 fL (ref 78.0–100.0)
Platelets: 97 10*3/uL — ABNORMAL LOW (ref 150–400)
RBC: 3.09 MIL/uL — ABNORMAL LOW (ref 3.87–5.11)
RDW: 14.1 % (ref 11.5–15.5)
WBC: 5.5 10*3/uL (ref 4.0–10.5)

## 2016-04-20 LAB — MAGNESIUM: Magnesium: 1.8 mg/dL (ref 1.7–2.4)

## 2016-04-20 MED ORDER — GUAIFENESIN ER 600 MG PO TB12
600.0000 mg | ORAL_TABLET | Freq: Two times a day (BID) | ORAL | Status: DC
  Administered 2016-04-20 – 2016-04-24 (×8): 600 mg via ORAL
  Filled 2016-04-20 (×8): qty 1

## 2016-04-20 MED ORDER — TORSEMIDE 20 MG PO TABS
20.0000 mg | ORAL_TABLET | Freq: Every day | ORAL | Status: DC
  Administered 2016-04-20: 20 mg via ORAL
  Filled 2016-04-20 (×2): qty 1

## 2016-04-20 NOTE — Progress Notes (Signed)
Patient ID: Maria Christian, female   DOB: Jan 10, 1925, 82 y.o.   MRN: [id number]    Progress Note   Subjective   Has a cold... Otherwise feels ok - able to eat and no c/o abdominal pain today , had BM this am Daughters in room , son will be in later and they all plan to talk about plan with Mom   Objective   Vital signs in last 24 hours: Temp:  [98.2 F (36.8 C)-99.1 F (37.3 C)] 99.1 F (37.3 C) (11/16 0406) Pulse Rate:  [72-88] 88 (11/16 0406) Resp:  [18] 18 (11/16 0406) BP: (122-157)/(64-67) 157/64 (11/16 0406) SpO2:  [97 %-99 %] 99 % (11/16 0406) Last BM Date: 04/19/16 General:   Elderly  white female in NAD Heart:  Regular rate and rhythm; no murmurs Lungs: Respirations even and unlabored, lungs CTA bilaterally Abdomen:  Soft, mildly distended, nontender . BS decreased some high pitched. Extremities:  Without edema. Neurologic:  Alert and oriented,  grossly normal neurologically. Psych:  Cooperative. Normal mood and affect.  Intake/Output from previous day: 11/15 0701 - 11/16 0700 In: 1618.8 [P.O.:330; I.V.:1288.8] Out: -  Intake/Output this shift: No intake/output data recorded.  Lab Results:  Recent Labs  04/18/16 0448 04/19/16 0455 04/20/16 0434  WBC 4.0 4.2 5.5  HGB 9.3* 9.7* 10.2*  HCT 28.8* 29.5* 30.9*  PLT 93* 95* 97*   BMET  Recent Labs  04/18/16 0448 04/19/16 0455 04/20/16 0434  NA 141 140 143  K 2.9* 3.8 3.7  CL 112* 113* 114*  CO2 24 23 25   GLUCOSE 124* 108* 103*  BUN 17 15 14   CREATININE 1.20* 1.16* 1.00  CALCIUM 8.5* 8.6* 9.2   LFT  Recent Labs  04/18/16 0448  PROT 5.8*  ALBUMIN 3.2*  AST 17  ALT 10*  ALKPHOS 69  BILITOT 0.6   PT/INR No results for input(s): LABPROT, INR in the last 72 hours.  Studies/Results: Dg Abd 2 Views  Result Date: 04/18/2016 CLINICAL DATA:  Abdominal pain and distention for several weeks. Evidence for a sigmoid volvulus on recent CT. EXAM: ABDOMEN - 2 VIEW COMPARISON:  12/20/2015 and abdominal  CT 04/17/2016 FINDINGS: Again noted are dilated gas-filled loops of sigmoid colon in the right abdomen. The configuration would be compatible with a sigmoid volvulus and not changed from the recent CT. There continues to be gas-filled transverse colon. There is no evidence for free air. Again noted is a left hip arthroplasty. Surgical clips in the right upper abdomen. IMPRESSION: Stable distension of the sigmoid colon and the gas pattern would be compatible with a sigmoid volvulus. No change from the recent CT exam. No evidence for free air. Electronically Signed   By: Markus Daft M.D.   On: 04/18/2016 10:15   Dg Abd Acute W/chest  Result Date: 04/19/2016 CLINICAL DATA:  Sigmoid volvulus EXAM: DG ABDOMEN ACUTE W/ 1V CHEST COMPARISON:  Abdominal CT from 2 days ago FINDINGS: Sigmoid volvulus pattern with improved gaseous distension of the sigmoid colon. Luminal diameter is up to 9 cm today, previously 11 cm. No small bowel obstruction. No evidence of pneumoperitoneum. No concerning mass effect or calcification. Cardiomegaly. There is no edema, consolidation, effusion, or pneumothorax. IMPRESSION: Sigmoid volvulus with improving gaseous distension. No evidence of free air or pneumatosis. Electronically Signed   By: Monte Fantasia M.D.   On: 04/19/2016 12:43       Assessment / Plan:    #1 82 yo female  with recurrent sigmoid Volvulus ,  s/p colonoscopic decompression x2 this admit And hx of several previous occurrences   Bowel is still dilated on xray but clinically feels fine -definitely has chronic component Continue vigorous bowel regimen  Will await family conference later today and decision regarding proceeding with sigmoid colectomy vs continuing current approach  with prn decompressions  Active Problems:   Diastolic CHF, chronic ([place])   Coronary artery disease   Dementia without behavioral disturbance   Anemia of chronic disease   Chronic atrial fibrillation (HCC)   CKD (chronic kidney  disease) stage 3, GFR 30-59 ml/min   Chronic respiratory failure (HCC)   Chronic pain   Sigmoid volvulus ([place])   DM (diabetes mellitus), type 2 ([place])     LOS: 3 days   [name]  04/20/2016, 9:55 AM

## 2016-04-20 NOTE — Progress Notes (Signed)
PROGRESS NOTE    Maria Christian  [id number] DOB: 1924-08-06 DOA: 04/17/2016 PCP: [name], DO   Brief Narrative: 82 y.o.femaleSNF resident, bed to wheelchair bound,  dementia, Parkinson's disease, atrial fibrillation not on anticoagulation, HTN, diastolic CHF, CAD, GERD, CKD, DM2,  recurrent volvulus presented with 2 days hx of abdominal pain Nausea and vomiting. Found to have volvulus status post decompression by gastroenterologist.  Assessment & Plan:   #Recurrent sigmoid volvulus:  -. Status post sigmoidoscopy and decompression on admission on 11/13. Repeat abdominal x-ray today showed distention of sigmoid colon which is compatible with sigmoid volvulus. Patient underwent second sigmoidoscopy and complete decompression  On 11/14.  - [facility], general surgery and palliative care about goals of care, family decided not do surgery, they want continue conservative management including repeat decompression by endoscope if needed in the future .   #Hypokalemia: Repleting potassium,  mag 1.8, replace as needed   #  Chronic atrial fibrillation ([place]): rate controlled. Patient is not on systemic anticoagulant. Continue Coreg.    #Essential hypertension: On low-dose amlodipine and Coreg. Monitor blood pressure.  #CKDIII: cr seems at baseline  #  Dementia without behavioral disturbance/FTT: baseline wheelchair bound for the last 11-12years, long term SNF resident, has sundowning symptoms in the hospital ,  Continue to provide supportive care.  Cough: reported on 11/16  cxr on 11/15 no acute changes, no fever, no leukocytosis  On chart review her home meds demadex has been held since admission due to npo status, now patient is able to eat, will d/c ivf, restart demadex, repeat cxr , montior    DVT prophylaxis: on aspirin and SCD. Code Status: DO NOT RESUSCITATE Family Communication: multiple family present at bedside Disposition Plan: expect discharge back to previous facility on  11/17  Consultants:   Gastroenterologist  General surgery  Palliative care  Procedures: sigmoidoscopy twice on 11/13 and 11/14 Antimicrobials: none  Subjective:  patient was seen and examined with multiple family members at bedside.  Patient  Denies abdominal pain, no n/v, report tolerate soft diet and had bm this am  family report patient developed productive cough   Objective: Vitals:   04/19/16 2016 04/20/16 0406 04/20/16 1047 04/20/16 1442  BP: 125/67 (!) 157/64 136/73 137/63  Pulse: 76 88 80 (!) 59  Resp: 18 18    Temp:  99.1 F (37.3 C) 98.5 F (36.9 C) 98.8 F (37.1 C)  TempSrc:  Oral Oral Oral  SpO2: 97% 99% 98% 97%  Weight:      Height:        Intake/Output Summary (Last 24 hours) at 04/20/16 1706 Last data filed at 04/20/16 1500  Gross per 24 hour  Intake             1890 ml  Output                0 ml  Net             1890 ml   Filed Weights   04/18/16 0600  Weight: 65.8 kg (145 lb)    Examination:  General exam: Elderly female lying on bed,Appears calm and comfortable  Respiratory system:  Respiratory effort normal. No wheezing . Does has mild crackles at basis Cardiovascular system: S1 & S2 heard, RRR.  No pedal edema. Gastrointestinal system:  Abdomen soft, no tenderness ,+bs. Central nervous system: A, Awake and following simple commands. Baseline dementia, no oriented to time Extremities: Symmetric 5 x 5 power. Skin: No rashes, lesions or  ulcers   Data Reviewed: I have personally reviewed following labs and imaging studies  CBC:  Recent Labs Lab 04/17/16 1551 04/18/16 0448 04/19/16 0455 04/20/16 0434  WBC 5.0 4.0 4.2 5.5  NEUTROABS 3.0  --   --   --   HGB 11.1* 9.3* 9.7* 10.2*  HCT 33.3* 28.8* 29.5* 30.9*  MCV 100.6* 101.1* 104.2* 100.0  PLT 119* 93* 95* 97*   Basic Metabolic Panel:  Recent Labs Lab 04/17/16 1551 04/18/16 0448 04/19/16 0455 04/20/16 0434  NA 141 141 140 143  K 3.7 2.9* 3.8 3.7  CL 109 112* 113* 114*    CO2 25 24 23 25   GLUCOSE 178* 124* 108* 103*  BUN 19 17 15 14   CREATININE 1.22* 1.20* 1.16* 1.00  CALCIUM 9.1 8.5* 8.6* 9.2  MG  --  1.7  --  1.8  PHOS  --  3.7  --   --    GFR: Estimated Creatinine Clearance: 34.3 mL/min (by C-G formula based on SCr of 1 mg/dL). Liver Function Tests:  Recent Labs Lab 04/17/16 1551 04/18/16 0448  AST 20 17  ALT 12* 10*  ALKPHOS 89 69  BILITOT 0.8 0.6  PROT 7.2 5.8*  ALBUMIN 3.8 3.2*   No results for input(s): LIPASE, AMYLASE in the last 168 hours. No results for input(s): AMMONIA in the last 168 hours. Coagulation Profile: No results for input(s): INR, PROTIME in the last 168 hours. Cardiac Enzymes: No results for input(s): CKTOTAL, CKMB, CKMBINDEX, TROPONINI in the last 168 hours. BNP (last 3 results) No results for input(s): PROBNP in the last 8760 hours. HbA1C:  Recent Labs  04/18/16 0448  HGBA1C 6.9*   CBG:  Recent Labs Lab 04/20/16 0121 04/20/16 0356 04/20/16 0736 04/20/16 1140 04/20/16 1653  GLUCAP 152* 103* 115* 196* 130*   Lipid Profile: No results for input(s): CHOL, HDL, LDLCALC, TRIG, CHOLHDL, LDLDIRECT in the last 72 hours. Thyroid Function Tests:  Recent Labs  04/18/16 0448  TSH 2.572   Anemia Panel: No results for input(s): VITAMINB12, FOLATE, FERRITIN, TIBC, IRON, RETICCTPCT in the last 72 hours. Sepsis Labs:  Recent Labs Lab 04/17/16 1617  LATICACIDVEN 1.17    Recent Results (from the past 240 hour(s))  MRSA PCR Screening     Status: None   Collection Time: 04/17/16 11:40 PM  Result Value Ref Range Status   MRSA by PCR NEGATIVE NEGATIVE Final    Comment:        The GeneXpert MRSA Assay (FDA approved for NASAL specimens only), is one component of a comprehensive MRSA colonization surveillance program. It is not intended to diagnose MRSA infection nor to guide or monitor treatment for MRSA infections.          Radiology Studies: Dg Abd Acute W/chest  Result Date:  04/19/2016 CLINICAL DATA:  Sigmoid volvulus EXAM: DG ABDOMEN ACUTE W/ 1V CHEST COMPARISON:  Abdominal CT from 2 days ago FINDINGS: Sigmoid volvulus pattern with improved gaseous distension of the sigmoid colon. Luminal diameter is up to 9 cm today, previously 11 cm. No small bowel obstruction. No evidence of pneumoperitoneum. No concerning mass effect or calcification. Cardiomegaly. There is no edema, consolidation, effusion, or pneumothorax. IMPRESSION: Sigmoid volvulus with improving gaseous distension. No evidence of free air or pneumatosis. Electronically Signed   By: Monte Fantasia M.D.   On: 04/19/2016 12:43        Scheduled Meds: . amLODipine  2.5 mg Oral Daily  . aspirin EC  81 mg Oral Daily  .  baclofen  5 mg Oral QHS  . carvedilol  3.125 mg Oral Daily  . dorzolamide  1 drop Both Eyes TID  . escitalopram  10 mg Oral Daily  . feeding supplement (ENSURE ENLIVE)  237 mL Oral BID BM  . guaiFENesin  600 mg Oral BID  . insulin aspart  0-9 Units Subcutaneous Q4H  . latanoprost  1 drop Both Eyes QHS  . linaclotide  145 mcg Oral QAC breakfast  . pantoprazole  40 mg Oral Q0600  . polyethylene glycol  17 g Oral Q0600  . senna  1 tablet Oral BID  . sodium chloride flush  3 mL Intravenous Q12H  . torsemide  20 mg Oral Daily   Continuous Infusions:    LOS: 3 days    Time spent: 26 minutes.    Florencia Reasons, MD PhD [facility] Pager [phone]  If 7PM-7AM, please contact night-coverage [email or web address] Password [email or web address] 04/20/2016, 5:06 PM

## 2016-04-20 NOTE — Progress Notes (Signed)
Nutrition Brief Follow-up  Patient currently consuming 100% of meals on soft diet. Pt drinking at least 1 Ensure supplement a day at this time.  Patient's family meeting today to discuss goals of care and surgical decisions.   Will monitor for plan. If nutrition issues arise, please consult RD.   Clayton Bibles, MS, RD, LDN Pager: [phone] After Hours Pager: [phone]

## 2016-04-21 LAB — CBC
HEMATOCRIT: 29.1 % — AB (ref 36.0–46.0)
HEMOGLOBIN: 9.7 g/dL — AB (ref 12.0–15.0)
MCH: 34.2 pg — AB (ref 26.0–34.0)
MCHC: 33.3 g/dL (ref 30.0–36.0)
MCV: 102.5 fL — ABNORMAL HIGH (ref 78.0–100.0)
Platelets: 97 10*3/uL — ABNORMAL LOW (ref 150–400)
RBC: 2.84 MIL/uL — AB (ref 3.87–5.11)
RDW: 14.4 % (ref 11.5–15.5)
WBC: 5.2 10*3/uL (ref 4.0–10.5)

## 2016-04-21 LAB — BASIC METABOLIC PANEL
Anion gap: 5 (ref 5–15)
BUN: 14 mg/dL (ref 6–20)
CHLORIDE: 110 mmol/L (ref 101–111)
CO2: 24 mmol/L (ref 22–32)
Calcium: 8.7 mg/dL — ABNORMAL LOW (ref 8.9–10.3)
Creatinine, Ser: 1.1 mg/dL — ABNORMAL HIGH (ref 0.44–1.00)
GFR calc non Af Amer: 43 mL/min — ABNORMAL LOW (ref >60)
GFR, EST AFRICAN AMERICAN: 50 mL/min — AB (ref >60)
Glucose, Bld: 124 mg/dL — ABNORMAL HIGH (ref 65–99)
POTASSIUM: 2.9 mmol/L — AB (ref 3.5–5.1)
SODIUM: 139 mmol/L (ref 135–145)

## 2016-04-21 LAB — GLUCOSE, CAPILLARY
GLUCOSE-CAPILLARY: 111 mg/dL — AB (ref 65–99)
GLUCOSE-CAPILLARY: 133 mg/dL — AB (ref 65–99)
GLUCOSE-CAPILLARY: 121 mg/dL — AB (ref 65–99)
GLUCOSE-CAPILLARY: 166 mg/dL — AB (ref 65–99)
Glucose-Capillary: 132 mg/dL — ABNORMAL HIGH (ref 65–99)

## 2016-04-21 LAB — MAGNESIUM: MAGNESIUM: 1.6 mg/dL — AB (ref 1.7–2.4)

## 2016-04-21 MED ORDER — SODIUM CHLORIDE 0.9 % IV SOLN
Freq: Once | INTRAVENOUS | Status: AC
  Administered 2016-04-21: 10:00:00 via INTRAVENOUS
  Filled 2016-04-21: qty 250

## 2016-04-21 MED ORDER — POTASSIUM CHLORIDE CRYS ER 20 MEQ PO TBCR
40.0000 meq | EXTENDED_RELEASE_TABLET | Freq: Three times a day (TID) | ORAL | Status: DC
  Administered 2016-04-21: 40 meq via ORAL
  Filled 2016-04-21: qty 2

## 2016-04-21 MED ORDER — MAGNESIUM SULFATE 2 GM/50ML IV SOLN
2.0000 g | Freq: Once | INTRAVENOUS | Status: AC
  Administered 2016-04-21: 2 g via INTRAVENOUS
  Filled 2016-04-21: qty 50

## 2016-04-21 MED ORDER — POTASSIUM CHLORIDE CRYS ER 20 MEQ PO TBCR
40.0000 meq | EXTENDED_RELEASE_TABLET | Freq: Two times a day (BID) | ORAL | Status: AC
Start: 2016-04-21 — End: 2016-04-22
  Administered 2016-04-21: 40 meq via ORAL
  Filled 2016-04-21: qty 2

## 2016-04-21 NOTE — Progress Notes (Signed)
Patient ID: Maria Christian, female   DOB: [birth year], 82 y.o.   MRN: [id number]    Progress Note   Subjective   Doing well- sleepy today . Eating well per family, had BM this am . Pt has no c/o pain  K 2.9 this am   Objective   Vital signs in last 24 hours: Temp:  [98.8 F (37.1 C)-99 F (37.2 C)] 99 F (37.2 C) (11/17 0526) Pulse Rate:  [59-79] 63 (11/17 1005) Resp:  [18-20] 18 (11/17 0526) BP: (97-137)/(47-63) 97/58 (11/17 1005) SpO2:  [92 %-97 %] 92 % (11/17 0526) Last BM Date: 04/21/16 General:   Elderly  white female in NAD Heart:  Regular rate and rhythm; no murmurs Lungs: Respirations even and unlabored, lungs CTA bilaterally Abdomen:  Soft, nontender and nondistended.BS +. Extremities:  Without edema. Neurologic:  Alert and oriented,  grossly normal neurologically. Psych:  Cooperative. Normal mood and affect.  Intake/Output from previous day: 11/16 0701 - 11/17 0700 In: 873.8 [I.V.:873.8] Out: -  Intake/Output this shift: No intake/output data recorded.  Lab Results:  Recent Labs  04/19/16 0455 04/20/16 0434 04/21/16 0500  WBC 4.2 5.5 5.2  HGB 9.7* 10.2* 9.7*  HCT 29.5* 30.9* 29.1*  PLT 95* 97* 97*   BMET  Recent Labs  04/19/16 0455 04/20/16 0434 04/21/16 0500  NA 140 143 139  K 3.8 3.7 2.9*  CL 113* 114* 110  CO2 23 25 24   GLUCOSE 108* 103* 124*  BUN 15 14 14   CREATININE 1.16* 1.00 1.10*  CALCIUM 8.6* 9.2 8.7*   LFT No results for input(s): PROT, ALBUMIN, AST, ALT, ALKPHOS, BILITOT, BILIDIR, IBILI in the last 72 hours. PT/INR No results for input(s): LABPROT, INR in the last 72 hours.  Studies/Results: Dg Chest 1 View  Result Date: 04/20/2016 CLINICAL DATA:  Cough.  History of atrial fibrillation. EXAM: CHEST 1 VIEW COMPARISON:  04/19/2016. FINDINGS: Stable enlarged cardiac silhouette. Left basilar scarring without significant change. Diffuse osteopenia. Aortic calcifications. IMPRESSION: 1. No acute abnormality. 2. Stable cardiomegaly  and aortic atherosclerosis. Electronically Signed   By: Claudie Revering M.D.   On: 04/20/2016 17:26   Dg Abd Acute W/chest  Result Date: 04/19/2016 CLINICAL DATA:  Sigmoid volvulus EXAM: DG ABDOMEN ACUTE W/ 1V CHEST COMPARISON:  Abdominal CT from 2 days ago FINDINGS: Sigmoid volvulus pattern with improved gaseous distension of the sigmoid colon. Luminal diameter is up to 9 cm today, previously 11 cm. No small bowel obstruction. No evidence of pneumoperitoneum. No concerning mass effect or calcification. Cardiomegaly. There is no edema, consolidation, effusion, or pneumothorax. IMPRESSION: Sigmoid volvulus with improving gaseous distension. No evidence of free air or pneumatosis. Electronically Signed   By: Monte Fantasia M.D.   On: 04/19/2016 12:43       Assessment / Plan:    #1 82 yo female with recurrent  Sigmoid Volvulus - doing well past 48 hours . Family has decided on conservative management with colonic decompression as needs for recurrent episodes  #2 hypokalemia- needs corrected #3 CAD #4 CHF #5 DM #6 chronic anemia  Plan;  Diet as tolerated /diabetic Continue Bowel regimen on discharge with Linzess 145 mcg daily, Miralax 17 gm daily We will be available for problems  Active Problems:   Diastolic CHF, chronic (HCC)   Coronary artery disease   Dementia without behavioral disturbance   Anemia of chronic disease   Chronic atrial fibrillation (HCC)   CKD (chronic kidney disease) stage 3, GFR 30-59 ml/min   Chronic  respiratory failure (HCC)   Chronic pain   Sigmoid volvulus (HCC)   DM (diabetes mellitus), type 2 ([place])   Dilatation of colon     LOS: 4 days   Amy Maria Christian  04/21/2016, 11:42 AM

## 2016-04-21 NOTE — NC FL2 (Signed)
[place] LEVEL OF CARE SCREENING TOOL     IDENTIFICATION  Patient Name: Maria Christian Birthdate: [birth year] Sex: female Admission Date (Current Location): 04/17/2016  [facility] and [place] Number:  Herbalist and Address:  [facility],  [place] [address], [place]      Provider Number: [id number]  Attending Physician Name and Address:  Hosie Poisson, MD  Relative Name and Phone Number:       Current Level of Care: SNF Recommended Level of Care: [place] Prior Approval Number:    Date Approved/Denied:   PASRR Number: [id number]  Discharge Plan: SNF    Current Diagnoses: Patient Active Problem List   Diagnosis Date Noted  . Dilatation of colon   . Abnormal CT scan, colon   . Acute frontal sinusitis 04/03/2016  . Diarrhea 01/04/2016  . E. coli UTI 01/04/2016  . AKI (acute kidney injury) ([place]) 01/04/2016  . GERD (gastroesophageal reflux disease) 01/04/2016  . DM (diabetes mellitus), type 2 ([place]) 01/04/2016  . Sigmoid volvulus ([place]) 12/20/2015  . Hypertensive heart disease with congestive heart failure ([place]) 12/02/2015  . Vitamin B12 deficiency 09/27/2015  . Skin yeast infection 05/10/2015  . Abrasion of buttock 05/10/2015  . Infection due to ESBL-producing Escherichia coli 04/19/2015  . Low back pain potentially associated with radiculopathy 04/18/2015  . Chronic respiratory failure ([place]) 03/16/2015  . Allergic rhinitis 03/16/2015  . Chronic pain 03/16/2015  . Osteoarthritis of multiple joints 03/16/2015  . Duodenal ulcer 03/12/2015  . GI bleed 03/11/2015  . Melena 03/11/2015  . Pressure ulcer 03/11/2015  . Headache 03/09/2015  . Abdominal distension 03/01/2015  . Cephalgia 03/01/2015  . Nausea and vomiting 02/27/2015  . Constipation 02/27/2015  . Anemia, iron deficiency 02/27/2015  . Anemia of chronic disease 02/27/2015  . Chronic atrial fibrillation ([place]) 02/27/2015  . Hypokalemia 02/27/2015  .  CKD (chronic kidney disease) stage 3, GFR 30-59 ml/min 02/27/2015  . Ileus ([place]) 02/26/2015  . Malnutrition of moderate degree ([place]) 10/10/2013  . FTT (failure to thrive) in adult 07/29/2013  . Volvulus of sigmoid colon ([place]) 05/26/2013  . Dementia without behavioral disturbance 12/19/2012  . Depression 12/19/2012  . Coronary artery disease   . Diastolic CHF, chronic ([place]) 05/24/2011    Orientation RESPIRATION BLADDER Height & Weight     Self, Time, Situation, Place  Normal Incontinent Weight: 145 lb (65.8 kg) Height:  5\' 5"  (165.1 cm)  BEHAVIORAL SYMPTOMS/MOOD NEUROLOGICAL BOWEL NUTRITION STATUS      Incontinent  (Soft Diet)  AMBULATORY STATUS COMMUNICATION OF NEEDS Skin   Total Care Verbally Normal                       Personal Care Assistance Level of Assistance  Bathing, Feeding, Dressing Bathing Assistance: Maximum assistance Feeding assistance: Independent Dressing Assistance: Maximum assistance     Functional Limitations Info  Sight, Hearing, Speech Sight Info: Adequate Hearing Info: Adequate Speech Info: Adequate    SPECIAL CARE FACTORS FREQUENCY                       Contractures Contractures Info: Not present    Additional Factors Info  Insulin Sliding Scale Code Status Info: DNR Allergies Info: Aspirin   Insulin Sliding Scale Info: insulin aspart (novoLOG) injection 0-9 Units       Current Medications (04/21/2016):  This is the current hospital active medication list Current Facility-Administered Medications  Medication Dose Route  Frequency Provider Last Rate Last Dose  . acetaminophen (TYLENOL) tablet 650 mg  650 mg Oral Q6H PRN Toy Baker, MD   650 mg at 04/21/16 0753   Or  . acetaminophen (TYLENOL) suppository 650 mg  650 mg Rectal Q6H PRN Toy Baker, MD      . aspirin EC tablet 81 mg  81 mg Oral Daily Toy Baker, MD   81 mg at 04/21/16 0958  . baclofen (LIORESAL) tablet 5 mg  5 mg Oral QHS Toy Baker,  MD   5 mg at 04/20/16 2134  . dorzolamide (TRUSOPT) 2 % ophthalmic solution 1 drop  1 drop Both Eyes TID Toy Baker, MD   1 drop at 04/21/16 1003  . escitalopram (LEXAPRO) tablet 10 mg  10 mg Oral Daily Toy Baker, MD   10 mg at 04/21/16 0959  . feeding supplement (ENSURE ENLIVE) (ENSURE ENLIVE) liquid 237 mL  237 mL Oral BID BM Dron Tanna Furry, MD   237 mL at 04/20/16 1051  . guaiFENesin (MUCINEX) 12 hr tablet 600 mg  600 mg Oral BID Florencia Reasons, MD   600 mg at 04/21/16 1014  . guaiFENesin-dextromethorphan (ROBITUSSIN DM) 100-10 MG/5ML syrup 5 mL  5 mL Oral Q4H PRN Florencia Reasons, MD   5 mL at 04/20/16 1657  . HYDROcodone-acetaminophen (NORCO/VICODIN) 5-325 MG per tablet 1-2 tablet  1-2 tablet Oral Q4H PRN Toy Baker, MD   1 tablet at 04/18/16 0037  . insulin aspart (novoLOG) injection 0-9 Units  0-9 Units Subcutaneous Q4H Toy Baker, MD   1 Units at 04/21/16 1224  . ipratropium-albuterol (DUONEB) 0.5-2.5 (3) MG/3ML nebulizer solution 3 mL  3 mL Nebulization Daily PRN Toy Baker, MD      . latanoprost (XALATAN) 0.005 % ophthalmic solution 1 drop  1 drop Both Eyes QHS Toy Baker, MD   1 drop at 04/20/16 2135  . linaclotide (LINZESS) capsule 145 mcg  145 mcg Oral QAC breakfast Toy Baker, MD   145 mcg at 04/21/16 0832  . magnesium sulfate IVPB 2 g 50 mL  2 g Intravenous Once Hosie Poisson, MD   2 g at 04/21/16 1230  . menthol-cetylpyridinium (CEPACOL) lozenge 3 mg  1 lozenge Oral PRN Toy Baker, MD      . MUSCLE RUB CREA   Topical PRN Dron Tanna Furry, MD      . ondansetron [facility]) tablet 4 mg  4 mg Oral Q6H PRN Toy Baker, MD       Or  . ondansetron (ZOFRAN) injection 4 mg  4 mg Intravenous Q6H PRN Toy Baker, MD   4 mg at 04/20/16 1433  . pantoprazole (PROTONIX) EC tablet 40 mg  40 mg Oral Q0600 Toy Baker, MD   40 mg at 04/21/16 0650  . polyethylene glycol (MIRALAX / GLYCOLAX) packet 17 g  17 g Oral Q0600  Toy Baker, MD   17 g at 04/21/16 1002  . potassium chloride SA (K-DUR,KLOR-CON) CR tablet 40 mEq  40 mEq Oral TID Hosie Poisson, MD      . senna (SENOKOT) tablet 8.6 mg  1 tablet Oral BID Toy Baker, MD   8.6 mg at 04/21/16 0958  . sodium chloride flush (NS) 0.9 % injection 3 mL  3 mL Intravenous Q12H Toy Baker, MD   3 mL at 04/18/16 2206     Discharge Medications: Please see discharge summary for a list of discharge medications.  Relevant Imaging Results:  Relevant Lab Results:   Additional Information [id number]  Lia Hopping, LCSW

## 2016-04-21 NOTE — Progress Notes (Addendum)
Pt has been receiving senna twice a day. Day shift RN articulated to this RN that the patient is having loose BMs and notified the MD who stated to place her on Enteric precautions. However, per protocol, the patient needs to not have has a laxative in the past 24 hours for this to be applicable. Will hold senna and continue to monitor.

## 2016-04-21 NOTE — Progress Notes (Signed)
Patient is LTC resident at [facility]. Weekend CSW  Will assist with patient disposition back to facility when medically ready.

## 2016-04-21 NOTE — Progress Notes (Signed)
Patient ID: Maria Christian, female   DOB: 1924-12-31, 82 y.o.   MRN: [id number]   LOS: 4 days   Subjective: Pt doing ok this morning, about to have breakfast. Going to return to SNF and see how things go.   Objective: Vital signs in last 24 hours: Temp:  [98.5 F (36.9 C)-99 F (37.2 C)] 99 F (37.2 C) (11/17 0526) Pulse Rate:  [59-80] 79 (11/17 0526) Resp:  [18-20] 18 (11/17 0526) BP: (117-137)/(47-73) 117/53 (11/17 0526) SpO2:  [92 %-98 %] 92 % (11/17 0526) Last BM Date: 04/20/16   Laboratory  CBC  Recent Labs  04/20/16 0434 04/21/16 0500  WBC 5.5 5.2  HGB 10.2* 9.7*  HCT 30.9* 29.1*  PLT 97* 97*   BMET  Recent Labs  04/20/16 0434 04/21/16 0500  NA 143 139  K 3.7 2.9*  CL 114* 110  CO2 25 24  GLUCOSE 103* 124*  BUN 14 14  CREATININE 1.00 1.10*  CALCIUM 9.2 8.7*    Physical Exam General appearance: no distress Resp: clear to auscultation bilaterally Cardio: regular rate and rhythm GI: normal findings: bowel sounds normal and soft, non-tender   Assessment/Plan: Sigmoid volvulus -- Pt/family have decided against surgery, plan to treat recurrences with flex sig decompression and would only consider surgery if this fails and she is unable to leave hospital. Surgery will sign off; please call with questions.    Lisette Abu, PA-C Pager: [phone] 04/21/2016

## 2016-04-21 NOTE — Care Management Important Message (Signed)
Important Message  Patient Details  Name: Maria Christian MRN: [id number] Date of Birth: [birth year]   Medicare Important Message Given:  Yes    Camillo Flaming 04/21/2016, 12:58 [place] Message  Patient Details  Name: Maria Christian MRN: [id number] Date of Birth: 02-Jan-1925   Medicare Important Message Given:  Yes    Camillo Flaming 04/21/2016, 12:58 PM

## 2016-04-21 NOTE — Progress Notes (Signed)
PROGRESS NOTE    Maria Christian  [id number] DOB: March 10, 1925 DOA: 04/17/2016 PCP: [name], DO   Brief Narrative: 82 y.o.femaleSNF resident, bed to wheelchair bound,  dementia, Parkinson's disease, atrial fibrillation not on anticoagulation, HTN, diastolic CHF, CAD, GERD, CKD, DM2,  recurrent volvulus presented with 2 days hx of abdominal pain Nausea and vomiting. Found to have volvulus status post decompression by gastroenterologist. Some abdominal discomfort today, no nausea or vomiting today.   Assessment & Plan:   #Recurrent sigmoid volvulus:  -. Status post sigmoidoscopy and decompression on admission on 11/13. Repeat abdominal x-ray on 11/14 showed distention of sigmoid colon which is compatible with sigmoid volvulus. Patient underwent second sigmoidoscopy and complete decompression  On 11/14.  - [facility]I, general surgery and palliative care about goals of care, family decided not do surgery, they want continue conservative management including repeat decompression by endoscope if needed in the future .   #Hypokalemia: Repleting potassium,  Mag is 1.6. repleting magnesium.    #  Chronic atrial fibrillation ([place]): rate controlled. Patient is not on systemic anticoagulant. Coreg on hold for hypotension.     #Essential hypertension:  On low-dose amlodipine and Coreg which were held for borderline bp.  Monitor blood pressure.  #CKDIII: cr seems at baseline  #  Dementia without behavioral disturbance/FTT: baseline wheelchair bound for the last 11-12years, long term SNF resident, has sundowning symptoms in the hospital ,  Continue to provide supportive care.  Cough: reported on 11/16  cxr on 11/15 no acute changes, no fever, no leukocytosis No cough today.   Possible d/c in 1 to 2 days.     DVT prophylaxis: on aspirin and SCD. Code Status: DO NOT RESUSCITATE Family Communication: daughter at bedside.  present at bedside Disposition Plan: expect discharge back to previous  facility on 11/18  Consultants:   Gastroenterologist  General surgery  Palliative care  Procedures: sigmoidoscopy twice on 11/13 and 11/14 Antimicrobials: none  Subjective:  patient was seen and examined with daughter at bedside.  Lethargic.    Objective: Vitals:   04/20/16 2353 04/21/16 0526 04/21/16 1005 04/21/16 1413  BP: (!) 120/50 (!) 117/53 (!) 97/58 (!) 118/56  Pulse:  79 63 63  Resp:  18  18  Temp:  99 F (37.2 C)  97.9 F (36.6 C)  TempSrc:  Oral  Oral  SpO2:  92%    Weight:      Height:        Intake/Output Summary (Last 24 hours) at 04/21/16 1420 Last data filed at 04/20/16 1800  Gross per 24 hour  Intake           873.75 ml  Output                0 ml  Net           873.75 ml   Filed Weights   04/18/16 0600  Weight: 65.8 kg (145 lb)    Examination:  General exam: Elderly female lying on bed,Appears calm and comfortable  Respiratory system:  Respiratory effort normal. No wheezing . Does has mild crackles at basis Cardiovascular system: S1 & S2 heard, RRR.  No pedal edema. Gastrointestinal system:  Abdomen soft, no tenderness ,+bs. Central nervous system: A, Awake and following simple commands. Baseline dementia, no oriented to time Extremities: Symmetric 5 x 5 power. Skin: No rashes, lesions or ulcers   Data Reviewed: I have personally reviewed following labs and imaging studies  CBC:  Recent Labs Lab 04/17/16  1551 04/18/16 0448 04/19/16 0455 04/20/16 0434 04/21/16 0500  WBC 5.0 4.0 4.2 5.5 5.2  NEUTROABS 3.0  --   --   --   --   HGB 11.1* 9.3* 9.7* 10.2* 9.7*  HCT 33.3* 28.8* 29.5* 30.9* 29.1*  MCV 100.6* 101.1* 104.2* 100.0 102.5*  PLT 119* 93* 95* 97* 97*   Basic Metabolic Panel:  Recent Labs Lab 04/17/16 1551 04/18/16 0448 04/19/16 0455 04/20/16 0434 04/21/16 0500  NA 141 141 140 143 139  K 3.7 2.9* 3.8 3.7 2.9*  CL 109 112* 113* 114* 110  CO2 25 24 23 25 24   GLUCOSE 178* 124* 108* 103* 124*  BUN 19 17 15 14 14     CREATININE 1.22* 1.20* 1.16* 1.00 1.10*  CALCIUM 9.1 8.5* 8.6* 9.2 8.7*  MG  --  1.7  --  1.8 1.6*  PHOS  --  3.7  --   --   --    GFR: Estimated Creatinine Clearance: 31.2 mL/min (by C-G formula based on SCr of 1.1 mg/dL (H)). Liver Function Tests:  Recent Labs Lab 04/17/16 1551 04/18/16 0448  AST 20 17  ALT 12* 10*  ALKPHOS 89 69  BILITOT 0.8 0.6  PROT 7.2 5.8*  ALBUMIN 3.8 3.2*   No results for input(s): LIPASE, AMYLASE in the last 168 hours. No results for input(s): AMMONIA in the last 168 hours. Coagulation Profile: No results for input(s): INR, PROTIME in the last 168 hours. Cardiac Enzymes: No results for input(s): CKTOTAL, CKMB, CKMBINDEX, TROPONINI in the last 168 hours. BNP (last 3 results) No results for input(s): PROBNP in the last 8760 hours. HbA1C: No results for input(s): HGBA1C in the last 72 hours. CBG:  Recent Labs Lab 04/20/16 2019 04/20/16 2352 04/21/16 0507 04/21/16 0741 04/21/16 1150  GLUCAP 146* 121* 111* 133* 121*   Lipid Profile: No results for input(s): CHOL, HDL, LDLCALC, TRIG, CHOLHDL, LDLDIRECT in the last 72 hours. Thyroid Function Tests: No results for input(s): TSH, T4TOTAL, FREET4, T3FREE, THYROIDAB in the last 72 hours. Anemia Panel: No results for input(s): VITAMINB12, FOLATE, FERRITIN, TIBC, IRON, RETICCTPCT in the last 72 hours. Sepsis Labs:  Recent Labs Lab 04/17/16 1617  LATICACIDVEN 1.17    Recent Results (from the past 240 hour(s))  MRSA PCR Screening     Status: None   Collection Time: 04/17/16 11:40 PM  Result Value Ref Range Status   MRSA by PCR NEGATIVE NEGATIVE Final    Comment:        The GeneXpert MRSA Assay (FDA approved for NASAL specimens only), is one component of a comprehensive MRSA colonization surveillance program. It is not intended to diagnose MRSA infection nor to guide or monitor treatment for MRSA infections.          Radiology Studies: Dg Chest 1 View  Result Date:  04/20/2016 CLINICAL DATA:  Cough.  History of atrial fibrillation. EXAM: CHEST 1 VIEW COMPARISON:  04/19/2016. FINDINGS: Stable enlarged cardiac silhouette. Left basilar scarring without significant change. Diffuse osteopenia. Aortic calcifications. IMPRESSION: 1. No acute abnormality. 2. Stable cardiomegaly and aortic atherosclerosis. Electronically Signed   By: Claudie Revering M.D.   On: 04/20/2016 17:26        Scheduled Meds: . aspirin EC  81 mg Oral Daily  . baclofen  5 mg Oral QHS  . dorzolamide  1 drop Both Eyes TID  . escitalopram  10 mg Oral Daily  . feeding supplement (ENSURE ENLIVE)  237 mL Oral BID BM  . guaiFENesin  600 mg Oral BID  . insulin aspart  0-9 Units Subcutaneous Q4H  . latanoprost  1 drop Both Eyes QHS  . linaclotide  145 mcg Oral QAC breakfast  . pantoprazole  40 mg Oral Q0600  . polyethylene glycol  17 g Oral Q0600  . potassium chloride  40 mEq Oral TID  . senna  1 tablet Oral BID  . sodium chloride flush  3 mL Intravenous Q12H   Continuous Infusions:    LOS: 4 days    Time spent: 30 minutes.    Hosie Poisson, MD  [facility] Pager [phone]  If 7PM-7AM, please contact night-coverage [email or web address] Password [email or web address] 04/21/2016, 2:20 PM

## 2016-04-22 LAB — BASIC METABOLIC PANEL
Anion gap: 5 (ref 5–15)
BUN: 16 mg/dL (ref 6–20)
CHLORIDE: 112 mmol/L — AB (ref 101–111)
CO2: 23 mmol/L (ref 22–32)
CREATININE: 1.15 mg/dL — AB (ref 0.44–1.00)
Calcium: 8.7 mg/dL — ABNORMAL LOW (ref 8.9–10.3)
GFR calc Af Amer: 47 mL/min — ABNORMAL LOW (ref >60)
GFR calc non Af Amer: 41 mL/min — ABNORMAL LOW (ref >60)
GLUCOSE: 197 mg/dL — AB (ref 65–99)
POTASSIUM: 3.8 mmol/L (ref 3.5–5.1)
Sodium: 140 mmol/L (ref 135–145)

## 2016-04-22 LAB — GLUCOSE, CAPILLARY
Glucose-Capillary: 104 mg/dL — ABNORMAL HIGH (ref 65–99)
Glucose-Capillary: 122 mg/dL — ABNORMAL HIGH (ref 65–99)
Glucose-Capillary: 135 mg/dL — ABNORMAL HIGH (ref 65–99)
Glucose-Capillary: 146 mg/dL — ABNORMAL HIGH (ref 65–99)
Glucose-Capillary: 182 mg/dL — ABNORMAL HIGH (ref 65–99)
Glucose-Capillary: 98 mg/dL (ref 65–99)

## 2016-04-22 LAB — MAGNESIUM: MAGNESIUM: 2 mg/dL (ref 1.7–2.4)

## 2016-04-22 MED ORDER — ENSURE ENLIVE PO LIQD: 237.0000 mL | Freq: Two times a day (BID) | ORAL | 12 refills | Status: DC

## 2016-04-22 MED ORDER — POTASSIUM CHLORIDE CRYS ER 20 MEQ PO TBCR: 40.0000 meq | EXTENDED_RELEASE_TABLET | Freq: Every day | ORAL | Status: DC

## 2016-04-22 MED ORDER — AMLODIPINE BESYLATE 5 MG PO TABS
2.5000 mg | ORAL_TABLET | Freq: Every day | ORAL | Status: DC
  Administered 2016-04-23 – 2016-04-24 (×2): 2.5 mg via ORAL
  Filled 2016-04-22 (×2): qty 1

## 2016-04-22 MED ORDER — POTASSIUM CHLORIDE CRYS ER 20 MEQ PO TBCR
40.0000 meq | EXTENDED_RELEASE_TABLET | Freq: Once | ORAL | Status: AC
  Administered 2016-04-22: 40 meq via ORAL
  Filled 2016-04-22: qty 2

## 2016-04-22 MED ORDER — CARVEDILOL 3.125 MG PO TABS
3.1250 mg | ORAL_TABLET | Freq: Two times a day (BID) | ORAL | Status: DC
  Administered 2016-04-23 – 2016-04-24 (×3): 3.125 mg via ORAL
  Filled 2016-04-22 (×3): qty 1

## 2016-04-22 NOTE — Progress Notes (Signed)
PROGRESS NOTE    Maria Christian  [id number] DOB: Jun 19, 1924 DOA: 04/17/2016 PCP: [name], DO   Brief Narrative: 82 y.o.femaleSNF resident, bed to wheelchair bound,  dementia, Parkinson's disease, atrial fibrillation not on anticoagulation, HTN, diastolic CHF, CAD, GERD, CKD, DM2,  recurrent volvulus presented with 2 days hx of abdominal pain Nausea and vomiting. Found to have volvulus status post decompression by gastroenterologist. Some abdominal discomfort today, no nausea or vomiting today.   Assessment & Plan:   #Recurrent sigmoid volvulus:  -. Status post sigmoidoscopy and decompression on admission on 11/13. Repeat abdominal x-ray on 11/14 showed distention of sigmoid colon which is compatible with sigmoid volvulus. Patient underwent second sigmoidoscopy and complete decompression  On 11/14.  - [facility]I, general surgery and palliative care about goals of care, family decided not do surgery, they want continue conservative management including repeat decompression by endoscope if needed in the future .   #Hypokalemia: Repleting potassium,  Mag is 1.6. repleting magnesium. Repeat K is 3.8. Mag is 2. Patient's son does not want the patient to be discharged with potassium less than 4.5. Recommendations to keep K greater than 4. Will continue with more  Oral supplementation today and tonight and repeat K in am.    #  Chronic atrial fibrillation ([place]): rate controlled. Patient is not on systemic anticoagulant. Coreg on hold for hypotension. Would resume coreg today as her pressure improved.     #Essential hypertension:  On low-dose amlodipine and Coreg which were held for borderline bp on 11/17. Would resume them today..  Monitor blood pressure.  #CKDIII: cr seems at baseline  #  Dementia without behavioral disturbance/FTT: baseline wheelchair bound for the last 11-12years, long term SNF resident, has sundowning symptoms in the hospital ,  Continue to provide supportive  care.  Cough: reported on 11/16  cxr on 11/15 no acute changes, no fever, no leukocytosis No cough today.   Possible d/c in 1 to 2 days.     DVT prophylaxis: on aspirin and SCD. Code Status: DO NOT RESUSCITATE Family Communication: daughter at bedside.  present at bedside Disposition Plan: expect discharge back to previous facility on 11/19  Consultants:   Gastroenterologist  General surgery  Palliative care  Procedures: sigmoidoscopy twice on 11/13 and 11/14 Antimicrobials: none  Subjective:  Alert and no complaints today.  Objective: Vitals:   04/21/16 1413 04/21/16 1955 04/22/16 0422 04/22/16 1354  BP: (!) 118/56 (!) 127/56 (!) 143/72 (!) 155/73  Pulse: 63 (!) 103 91 97  Resp: 18 16 18 18   Temp: 97.9 F (36.6 C) 98.8 F (37.1 C) 98.5 F (36.9 C) 97.5 F (36.4 C)  TempSrc: Oral Oral Oral Oral  SpO2:  95% 95% 96%  Weight:      Height:       No intake or output data in the 24 hours ending 04/22/16 1721 Filed Weights   04/18/16 0600  Weight: 65.8 kg (145 lb)    Examination:  General exam: Elderly female lying on bed,Appears calm and comfortable  Respiratory system:  Respiratory effort normal. No wheezing . Does has mild crackles at basis Cardiovascular system: S1 & S2 heard, RRR.  No pedal edema. Gastrointestinal system:  Abdomen soft, no tenderness ,+bs. Central nervous system: A, Awake and following simple commands. Baseline dementia, no oriented to time Extremities: Symmetric 5 x 5 power. Skin: No rashes, lesions or ulcers   Data Reviewed: I have personally reviewed following labs and imaging studies  CBC:  Recent Labs Lab 04/17/16  1551 04/18/16 0448 04/19/16 0455 04/20/16 0434 04/21/16 0500  WBC 5.0 4.0 4.2 5.5 5.2  NEUTROABS 3.0  --   --   --   --   HGB 11.1* 9.3* 9.7* 10.2* 9.7*  HCT 33.3* 28.8* 29.5* 30.9* 29.1*  MCV 100.6* 101.1* 104.2* 100.0 102.5*  PLT 119* 93* 95* 97* 97*   Basic Metabolic Panel:  Recent Labs Lab  04/18/16 0448 04/19/16 0455 04/20/16 0434 04/21/16 0500 04/22/16 1150  NA 141 140 143 139 140  K 2.9* 3.8 3.7 2.9* 3.8  CL 112* 113* 114* 110 112*  CO2 24 23 25 24 23   GLUCOSE 124* 108* 103* 124* 197*  BUN 17 15 14 14 16   CREATININE 1.20* 1.16* 1.00 1.10* 1.15*  CALCIUM 8.5* 8.6* 9.2 8.7* 8.7*  MG 1.7  --  1.8 1.6* 2.0  PHOS 3.7  --   --   --   --    GFR: Estimated Creatinine Clearance: 29.8 mL/min (by C-G formula based on SCr of 1.15 mg/dL (H)). Liver Function Tests:  Recent Labs Lab 04/17/16 1551 04/18/16 0448  AST 20 17  ALT 12* 10*  ALKPHOS 89 69  BILITOT 0.8 0.6  PROT 7.2 5.8*  ALBUMIN 3.8 3.2*   No results for input(s): LIPASE, AMYLASE in the last 168 hours. No results for input(s): AMMONIA in the last 168 hours. Coagulation Profile: No results for input(s): INR, PROTIME in the last 168 hours. Cardiac Enzymes: No results for input(s): CKTOTAL, CKMB, CKMBINDEX, TROPONINI in the last 168 hours. BNP (last 3 results) No results for input(s): PROBNP in the last 8760 hours. HbA1C: No results for input(s): HGBA1C in the last 72 hours. CBG:  Recent Labs Lab 04/22/16 0016 04/22/16 0414 04/22/16 0748 04/22/16 1301 04/22/16 1656  GLUCAP 98 122* 104* 182* 135*   Lipid Profile: No results for input(s): CHOL, HDL, LDLCALC, TRIG, CHOLHDL, LDLDIRECT in the last 72 hours. Thyroid Function Tests: No results for input(s): TSH, T4TOTAL, FREET4, T3FREE, THYROIDAB in the last 72 hours. Anemia Panel: No results for input(s): VITAMINB12, FOLATE, FERRITIN, TIBC, IRON, RETICCTPCT in the last 72 hours. Sepsis Labs:  Recent Labs Lab 04/17/16 1617  LATICACIDVEN 1.17    Recent Results (from the past 240 hour(s))  MRSA PCR Screening     Status: None   Collection Time: 04/17/16 11:40 PM  Result Value Ref Range Status   MRSA by PCR NEGATIVE NEGATIVE Final    Comment:        The GeneXpert MRSA Assay (FDA approved for NASAL specimens only), is one component of  a comprehensive MRSA colonization surveillance program. It is not intended to diagnose MRSA infection nor to guide or monitor treatment for MRSA infections.          Radiology Studies: Dg Chest 1 View  Result Date: 04/20/2016 CLINICAL DATA:  Cough.  History of atrial fibrillation. EXAM: CHEST 1 VIEW COMPARISON:  04/19/2016. FINDINGS: Stable enlarged cardiac silhouette. Left basilar scarring without significant change. Diffuse osteopenia. Aortic calcifications. IMPRESSION: 1. No acute abnormality. 2. Stable cardiomegaly and aortic atherosclerosis. Electronically Signed   By: Claudie Revering M.D.   On: 04/20/2016 17:26        Scheduled Meds: . aspirin EC  81 mg Oral Daily  . baclofen  5 mg Oral QHS  . dorzolamide  1 drop Both Eyes TID  . escitalopram  10 mg Oral Daily  . feeding supplement (ENSURE ENLIVE)  237 mL Oral BID BM  . guaiFENesin  600 mg Oral  BID  . insulin aspart  0-9 Units Subcutaneous Q4H  . latanoprost  1 drop Both Eyes QHS  . linaclotide  145 mcg Oral QAC breakfast  . pantoprazole  40 mg Oral Q0600  . polyethylene glycol  17 g Oral Q0600  . sodium chloride flush  3 mL Intravenous Q12H   Continuous Infusions:    LOS: 5 days    Time spent: 30 minutes.    Hosie Poisson, MD  [facility] Pager [phone]  If 7PM-7AM, please contact night-coverage [email or web address] Password [email or web address] 04/22/2016, 5:21 PM

## 2016-04-23 ENCOUNTER — Inpatient Hospital Stay (HOSPITAL_COMMUNITY): Payer: Medicare Other

## 2016-04-23 LAB — BASIC METABOLIC PANEL
Anion gap: 5 (ref 5–15)
BUN: 15 mg/dL (ref 6–20)
CHLORIDE: 111 mmol/L (ref 101–111)
CO2: 22 mmol/L (ref 22–32)
CREATININE: 1.02 mg/dL — AB (ref 0.44–1.00)
Calcium: 8.5 mg/dL — ABNORMAL LOW (ref 8.9–10.3)
GFR calc Af Amer: 55 mL/min — ABNORMAL LOW (ref >60)
GFR calc non Af Amer: 47 mL/min — ABNORMAL LOW (ref >60)
Glucose, Bld: 172 mg/dL — ABNORMAL HIGH (ref 65–99)
POTASSIUM: 3.8 mmol/L (ref 3.5–5.1)
Sodium: 138 mmol/L (ref 135–145)

## 2016-04-23 LAB — GLUCOSE, CAPILLARY
GLUCOSE-CAPILLARY: 136 mg/dL — AB (ref 65–99)
GLUCOSE-CAPILLARY: 145 mg/dL — AB (ref 65–99)
GLUCOSE-CAPILLARY: 177 mg/dL — AB (ref 65–99)
Glucose-Capillary: 115 mg/dL — ABNORMAL HIGH (ref 65–99)
Glucose-Capillary: 119 mg/dL — ABNORMAL HIGH (ref 65–99)
Glucose-Capillary: 149 mg/dL — ABNORMAL HIGH (ref 65–99)

## 2016-04-23 MED ORDER — POTASSIUM CHLORIDE CRYS ER 20 MEQ PO TBCR
40.0000 meq | EXTENDED_RELEASE_TABLET | Freq: Two times a day (BID) | ORAL | Status: AC
  Administered 2016-04-23 (×2): 40 meq via ORAL
  Filled 2016-04-23 (×2): qty 2

## 2016-04-23 NOTE — Progress Notes (Signed)
PROGRESS NOTE    Maria Christian  [id number] DOB: 04-22-1925 DOA: 04/17/2016 PCP: [name], DO   Brief Narrative: 82 y.o.femaleSNF resident, bed to wheelchair bound,  dementia, Parkinson's disease, atrial fibrillation not on anticoagulation, HTN, diastolic CHF, CAD, GERD, CKD, DM2,  recurrent volvulus presented with 2 days hx of abdominal pain Nausea and vomiting. Found to have volvulus status post decompression by gastroenterologist. Some abdominal discomfort today, no nausea or vomiting today.   Assessment & Plan:   #Recurrent sigmoid volvulus:  -. Status post sigmoidoscopy and decompression on admission on 11/13. Repeat abdominal x-ray on 11/14 showed distention of sigmoid colon which is compatible with sigmoid volvulus. Patient underwent second sigmoidoscopy and complete decompression  On 11/14.  - LBGI, general surgery and palliative care about goals of care, family decided not do surgery, they want continue conservative management including repeat decompression by endoscope if needed in the future .   #Hypokalemia: Repleting potassium,  Mag is 1.6. repleting magnesium. Repeat K is 3.8. Mag is 2. Patient's son does not want the patient to be discharged with potassium less than 4.5. Recommendations to keep K greater than 4. Will continue with more  Oral supplementation today and tonight and repeat K in am.    #  Chronic atrial fibrillation ([name]): rate controlled. Patient is not on systemic anticoagulant.   resume coreg today as her pressure improved.     #Essential hypertension:  On low-dose amlodipine and Coreg which were held for borderline bp on 11/17. Resume coreg and norvasc.   #CKDIII: cr seems at baseline  #  Dementia without behavioral disturbance/FTT: baseline wheelchair bound for the last 11-12years, long term SNF resident, has sundowning symptoms in the hospital ,  Continue to provide supportive care.  Cough: reported on 11/16  cxr on 11/15 no acute changes, no  fever, no leukocytosis No cough today.    Neck pain: since many months, ordered CT cervical spine.   Possible d/c in 1 to 2 days.     DVT prophylaxis: on aspirin and SCD. Code Status: DO NOT RESUSCITATE Family Communication: daughter at bedside.  Disposition Plan: expect discharge back to previous facility on 11/20  Consultants:   Gastroenterologist  General surgery  Palliative care  Procedures: sigmoidoscopy twice on 11/13 and 11/14 Antimicrobials: none  Subjective:  Neck pain.   Objective: Vitals:   04/22/16 1354 04/22/16 2251 04/23/16 0632 04/23/16 1405  BP: (!) 155/73 (!) 141/65 (!) 153/76 136/63  Pulse: 97 85 79 76  Resp: 18 18 18 18   Temp: 97.5 F (36.4 C) 99.3 F (37.4 C) 98.8 F (37.1 C) 98.6 F (37 C)  TempSrc: Oral Oral Oral Oral  SpO2: 96% 95% 96% 98%  Weight:      Height:        Intake/Output Summary (Last 24 hours) at 04/23/16 1812 Last data filed at 04/23/16 [id number]  Gross per 24 hour  Intake              360 ml  Output                0 ml  Net              360 ml   Filed Weights   04/18/16 0600  Weight: 65.8 kg (145 lb)    Examination:  General exam: Elderly female lying on bed,Appears calm and comfortable  Respiratory system:  Respiratory effort normal. No wheezing . Does has mild crackles at bases Cardiovascular system: S1 & S2  heard, RRR.  No pedal edema. Gastrointestinal system:  Abdomen soft, no tenderness ,+bs. Central nervous system: A, Awake and following simple commands. Baseline dementia, no oriented to time Extremities: Symmetric 5 x 5 power. Skin: No rashes, lesions or ulcers   Data Reviewed: I have personally reviewed following labs and imaging studies  CBC:  Recent Labs Lab 04/17/16 1551 04/18/16 0448 04/19/16 0455 04/20/16 0434 04/21/16 0500  WBC 5.0 4.0 4.2 5.5 5.2  NEUTROABS 3.0  --   --   --   --   HGB 11.1* 9.3* 9.7* 10.2* 9.7*  HCT 33.3* 28.8* 29.5* 30.9* 29.1*  MCV 100.6* 101.1* 104.2* 100.0 102.5*    PLT 119* 93* 95* 97* 97*   Basic Metabolic Panel:  Recent Labs Lab 04/18/16 0448 04/19/16 0455 04/20/16 0434 04/21/16 0500 04/22/16 1150 04/23/16 0939  NA 141 140 143 139 140 138  K 2.9* 3.8 3.7 2.9* 3.8 3.8  CL 112* 113* 114* 110 112* 111  CO2 24 23 25 24 23 22   GLUCOSE 124* 108* 103* 124* 197* 172*  BUN 17 15 14 14 16 15   CREATININE 1.20* 1.16* 1.00 1.10* 1.15* 1.02*  CALCIUM 8.5* 8.6* 9.2 8.7* 8.7* 8.5*  MG 1.7  --  1.8 1.6* 2.0  --   PHOS 3.7  --   --   --   --   --    GFR: Estimated Creatinine Clearance: 33.6 mL/min (by C-G formula based on SCr of 1.02 mg/dL (H)). Liver Function Tests:  Recent Labs Lab 04/17/16 1551 04/18/16 0448  AST 20 17  ALT 12* 10*  ALKPHOS 89 69  BILITOT 0.8 0.6  PROT 7.2 5.8*  ALBUMIN 3.8 3.2*   No results for input(s): LIPASE, AMYLASE in the last 168 hours. No results for input(s): AMMONIA in the last 168 hours. Coagulation Profile: No results for input(s): INR, PROTIME in the last 168 hours. Cardiac Enzymes: No results for input(s): CKTOTAL, CKMB, CKMBINDEX, TROPONINI in the last 168 hours. BNP (last 3 results) No results for input(s): PROBNP in the last 8760 hours. HbA1C: No results for input(s): HGBA1C in the last 72 hours. CBG:  Recent Labs Lab 04/22/16 2002 04/23/16 0009 04/23/16 0739 04/23/16 1241 04/23/16 1704  GLUCAP 146* 119* 115* 145* 177*   Lipid Profile: No results for input(s): CHOL, HDL, LDLCALC, TRIG, CHOLHDL, LDLDIRECT in the last 72 hours. Thyroid Function Tests: No results for input(s): TSH, T4TOTAL, FREET4, T3FREE, THYROIDAB in the last 72 hours. Anemia Panel: No results for input(s): VITAMINB12, FOLATE, FERRITIN, TIBC, IRON, RETICCTPCT in the last 72 hours. Sepsis Labs:  Recent Labs Lab 04/17/16 1617  LATICACIDVEN 1.17    Recent Results (from the past 240 hour(s))  MRSA PCR Screening     Status: None   Collection Time: 04/17/16 11:40 PM  Result Value Ref Range Status   MRSA by PCR NEGATIVE  NEGATIVE Final    Comment:        The GeneXpert MRSA Assay (FDA approved for NASAL specimens only), is one component of a comprehensive MRSA colonization surveillance program. It is not intended to diagnose MRSA infection nor to guide or monitor treatment for MRSA infections.          Radiology Studies: Ct Cervical Spine Wo Contrast  Result Date: 04/23/2016 CLINICAL DATA:  Chronic neck pain. EXAM: CT CERVICAL SPINE WITHOUT CONTRAST TECHNIQUE: Multidetector CT imaging of the cervical spine was performed without intravenous contrast. Multiplanar CT image reconstructions were also generated. COMPARISON:  None. FINDINGS: Alignment: There  is reversal of normal lordosis centered at C5-6. No other malalignment. Skull base and vertebrae: No fractures. Soft tissues and spinal canal: Atherosclerotic change in the carotid arteries bilaterally. No other soft tissue abnormalities. Disc levels: Multilevel degenerative changes are seen. There is calcified hypertrophy of the ligaments along the posterior aspect of the odontoid process. This narrows the canal at the C1 level to an AP diameter of 10.5 mm. Small posterior disc bulges are identified at several levels. A disc osteophyte complex at C4-5 narrows the cervical canal at 8 mm. Moderate anterior osteophytes are seen at multiple levels. Facet degenerative changes are seen, particularly in the upper cervical spine to the right. Severe neural foraminal narrowing is seen at on the right at C3-4 and C4-5 with more mild narrowing on the right at C5-6. Mild left neural foraminal narrowing is seen at C4-5. Upper chest: Bilateral pleural effusions identified. No other abnormalities. Other: No other abnormalities. IMPRESSION: 1. No fracture or malalignment. 2. Multilevel degenerative changes as described above with canal narrowing most prominent at C4-5 measuring 8 mm in AP dimension. Neural foraminal narrowing, right greater than left, at multiple levels as  described above. Electronically Signed   By: Dorise Bullion III M.D   On: 04/23/2016 11:55        Scheduled Meds: . amLODipine  2.5 mg Oral Daily  . aspirin EC  81 mg Oral Daily  . baclofen  5 mg Oral QHS  . carvedilol  3.125 mg Oral BID WC  . dorzolamide  1 drop Both Eyes TID  . escitalopram  10 mg Oral Daily  . feeding supplement (ENSURE ENLIVE)  237 mL Oral BID BM  . guaiFENesin  600 mg Oral BID  . insulin aspart  0-9 Units Subcutaneous Q4H  . latanoprost  1 drop Both Eyes QHS  . linaclotide  145 mcg Oral QAC breakfast  . pantoprazole  40 mg Oral Q0600  . polyethylene glycol  17 g Oral Q0600  . potassium chloride  40 mEq Oral BID  . sodium chloride flush  3 mL Intravenous Q12H   Continuous Infusions:    LOS: 6 days    Time spent: 30 minutes.    Hosie Poisson, MD  [facility] Pager [phone]  If 7PM-7AM, please contact night-coverage [email or web address] Password [email or web address] 04/23/2016, 6:12 PM

## 2016-04-24 DIAGNOSIS — F0280 Dementia in other diseases classified elsewhere without behavioral disturbance: Secondary | ICD-10-CM | POA: Diagnosis not present

## 2016-04-24 DIAGNOSIS — R8290 Unspecified abnormal findings in urine: Secondary | ICD-10-CM | POA: Diagnosis not present

## 2016-04-24 DIAGNOSIS — G8929 Other chronic pain: Secondary | ICD-10-CM | POA: Diagnosis not present

## 2016-04-24 DIAGNOSIS — R001 Bradycardia, unspecified: Secondary | ICD-10-CM | POA: Diagnosis present

## 2016-04-24 DIAGNOSIS — Q631 Lobulated, fused and horseshoe kidney: Secondary | ICD-10-CM | POA: Diagnosis not present

## 2016-04-24 DIAGNOSIS — R5381 Other malaise: Secondary | ICD-10-CM | POA: Diagnosis not present

## 2016-04-24 DIAGNOSIS — R402441 Other coma, without documented Glasgow coma scale score, or with partial score reported, in the field [EMT or ambulance]: Secondary | ICD-10-CM | POA: Diagnosis not present

## 2016-04-24 DIAGNOSIS — L84 Corns and callosities: Secondary | ICD-10-CM | POA: Diagnosis not present

## 2016-04-24 DIAGNOSIS — K598 Other specified functional intestinal disorders: Secondary | ICD-10-CM | POA: Diagnosis not present

## 2016-04-24 DIAGNOSIS — L03115 Cellulitis of right lower limb: Secondary | ICD-10-CM | POA: Diagnosis not present

## 2016-04-24 DIAGNOSIS — M2669 Other specified disorders of temporomandibular joint: Secondary | ICD-10-CM | POA: Diagnosis not present

## 2016-04-24 DIAGNOSIS — M47812 Spondylosis without myelopathy or radiculopathy, cervical region: Secondary | ICD-10-CM | POA: Diagnosis not present

## 2016-04-24 DIAGNOSIS — M79676 Pain in unspecified toe(s): Secondary | ICD-10-CM | POA: Diagnosis not present

## 2016-04-24 DIAGNOSIS — I482 Chronic atrial fibrillation: Secondary | ICD-10-CM | POA: Diagnosis not present

## 2016-04-24 DIAGNOSIS — N179 Acute kidney failure, unspecified: Secondary | ICD-10-CM | POA: Diagnosis present

## 2016-04-24 DIAGNOSIS — I5032 Chronic diastolic (congestive) heart failure: Secondary | ICD-10-CM | POA: Diagnosis not present

## 2016-04-24 DIAGNOSIS — M25552 Pain in left hip: Secondary | ICD-10-CM | POA: Diagnosis not present

## 2016-04-24 DIAGNOSIS — M79674 Pain in right toe(s): Secondary | ICD-10-CM | POA: Diagnosis not present

## 2016-04-24 DIAGNOSIS — R140 Abdominal distension (gaseous): Secondary | ICD-10-CM | POA: Diagnosis not present

## 2016-04-24 DIAGNOSIS — E1122 Type 2 diabetes mellitus with diabetic chronic kidney disease: Secondary | ICD-10-CM | POA: Diagnosis not present

## 2016-04-24 DIAGNOSIS — H547 Unspecified visual loss: Secondary | ICD-10-CM | POA: Diagnosis not present

## 2016-04-24 DIAGNOSIS — R600 Localized edema: Secondary | ICD-10-CM | POA: Diagnosis not present

## 2016-04-24 DIAGNOSIS — M150 Primary generalized (osteo)arthritis: Secondary | ICD-10-CM | POA: Diagnosis not present

## 2016-04-24 DIAGNOSIS — H353131 Nonexudative age-related macular degeneration, bilateral, early dry stage: Secondary | ICD-10-CM | POA: Diagnosis not present

## 2016-04-24 DIAGNOSIS — E119 Type 2 diabetes mellitus without complications: Secondary | ICD-10-CM | POA: Diagnosis not present

## 2016-04-24 DIAGNOSIS — H903 Sensorineural hearing loss, bilateral: Secondary | ICD-10-CM | POA: Diagnosis not present

## 2016-04-24 DIAGNOSIS — D638 Anemia in other chronic diseases classified elsewhere: Secondary | ICD-10-CM | POA: Diagnosis not present

## 2016-04-24 DIAGNOSIS — G63 Polyneuropathy in diseases classified elsewhere: Secondary | ICD-10-CM | POA: Diagnosis not present

## 2016-04-24 DIAGNOSIS — F325 Major depressive disorder, single episode, in full remission: Secondary | ICD-10-CM | POA: Diagnosis not present

## 2016-04-24 DIAGNOSIS — B351 Tinea unguium: Secondary | ICD-10-CM | POA: Diagnosis not present

## 2016-04-24 DIAGNOSIS — I5030 Unspecified diastolic (congestive) heart failure: Secondary | ICD-10-CM | POA: Diagnosis not present

## 2016-04-24 DIAGNOSIS — R079 Chest pain, unspecified: Secondary | ICD-10-CM | POA: Diagnosis not present

## 2016-04-24 DIAGNOSIS — K9189 Other postprocedural complications and disorders of digestive system: Secondary | ICD-10-CM | POA: Diagnosis not present

## 2016-04-24 DIAGNOSIS — K567 Ileus, unspecified: Secondary | ICD-10-CM | POA: Diagnosis not present

## 2016-04-24 DIAGNOSIS — I739 Peripheral vascular disease, unspecified: Secondary | ICD-10-CM | POA: Diagnosis not present

## 2016-04-24 DIAGNOSIS — H04123 Dry eye syndrome of bilateral lacrimal glands: Secondary | ICD-10-CM | POA: Diagnosis not present

## 2016-04-24 DIAGNOSIS — L89152 Pressure ulcer of sacral region, stage 2: Secondary | ICD-10-CM | POA: Diagnosis not present

## 2016-04-24 DIAGNOSIS — I10 Essential (primary) hypertension: Secondary | ICD-10-CM | POA: Diagnosis not present

## 2016-04-24 DIAGNOSIS — D599 Acquired hemolytic anemia, unspecified: Secondary | ICD-10-CM | POA: Diagnosis not present

## 2016-04-24 DIAGNOSIS — B029 Zoster without complications: Secondary | ICD-10-CM | POA: Diagnosis not present

## 2016-04-24 DIAGNOSIS — G4700 Insomnia, unspecified: Secondary | ICD-10-CM | POA: Diagnosis not present

## 2016-04-24 DIAGNOSIS — R1311 Dysphagia, oral phase: Secondary | ICD-10-CM | POA: Diagnosis not present

## 2016-04-24 DIAGNOSIS — R197 Diarrhea, unspecified: Secondary | ICD-10-CM | POA: Diagnosis not present

## 2016-04-24 DIAGNOSIS — Z7982 Long term (current) use of aspirin: Secondary | ICD-10-CM | POA: Diagnosis not present

## 2016-04-24 DIAGNOSIS — I4891 Unspecified atrial fibrillation: Secondary | ICD-10-CM | POA: Diagnosis not present

## 2016-04-24 DIAGNOSIS — M7989 Other specified soft tissue disorders: Secondary | ICD-10-CM | POA: Diagnosis not present

## 2016-04-24 DIAGNOSIS — Z66 Do not resuscitate: Secondary | ICD-10-CM | POA: Diagnosis present

## 2016-04-24 DIAGNOSIS — G20 Parkinson's disease: Secondary | ICD-10-CM | POA: Diagnosis present

## 2016-04-24 DIAGNOSIS — R100 Acute abdomen: Secondary | ICD-10-CM | POA: Diagnosis not present

## 2016-04-24 DIAGNOSIS — L608 Other nail disorders: Secondary | ICD-10-CM | POA: Diagnosis not present

## 2016-04-24 DIAGNOSIS — F329 Major depressive disorder, single episode, unspecified: Secondary | ICD-10-CM | POA: Diagnosis not present

## 2016-04-24 DIAGNOSIS — K269 Duodenal ulcer, unspecified as acute or chronic, without hemorrhage or perforation: Secondary | ICD-10-CM | POA: Diagnosis not present

## 2016-04-24 DIAGNOSIS — R4182 Altered mental status, unspecified: Secondary | ICD-10-CM | POA: Diagnosis not present

## 2016-04-24 DIAGNOSIS — R300 Dysuria: Secondary | ICD-10-CM | POA: Diagnosis not present

## 2016-04-24 DIAGNOSIS — M26609 Unspecified temporomandibular joint disorder, unspecified side: Secondary | ICD-10-CM | POA: Diagnosis not present

## 2016-04-24 DIAGNOSIS — E039 Hypothyroidism, unspecified: Secondary | ICD-10-CM | POA: Diagnosis not present

## 2016-04-24 DIAGNOSIS — B370 Candidal stomatitis: Secondary | ICD-10-CM | POA: Diagnosis not present

## 2016-04-24 DIAGNOSIS — M79609 Pain in unspecified limb: Secondary | ICD-10-CM | POA: Diagnosis not present

## 2016-04-24 DIAGNOSIS — R05 Cough: Secondary | ICD-10-CM | POA: Diagnosis not present

## 2016-04-24 DIAGNOSIS — N289 Disorder of kidney and ureter, unspecified: Secondary | ICD-10-CM | POA: Diagnosis not present

## 2016-04-24 DIAGNOSIS — E1142 Type 2 diabetes mellitus with diabetic polyneuropathy: Secondary | ICD-10-CM | POA: Diagnosis not present

## 2016-04-24 DIAGNOSIS — J209 Acute bronchitis, unspecified: Secondary | ICD-10-CM | POA: Diagnosis not present

## 2016-04-24 DIAGNOSIS — R531 Weakness: Secondary | ICD-10-CM | POA: Diagnosis not present

## 2016-04-24 DIAGNOSIS — Z8673 Personal history of transient ischemic attack (TIA), and cerebral infarction without residual deficits: Secondary | ICD-10-CM | POA: Diagnosis not present

## 2016-04-24 DIAGNOSIS — R634 Abnormal weight loss: Secondary | ICD-10-CM | POA: Diagnosis not present

## 2016-04-24 DIAGNOSIS — F0390 Unspecified dementia without behavioral disturbance: Secondary | ICD-10-CM | POA: Diagnosis not present

## 2016-04-24 DIAGNOSIS — E872 Acidosis: Secondary | ICD-10-CM | POA: Diagnosis present

## 2016-04-24 DIAGNOSIS — R9431 Abnormal electrocardiogram [ECG] [EKG]: Secondary | ICD-10-CM | POA: Diagnosis not present

## 2016-04-24 DIAGNOSIS — M6281 Muscle weakness (generalized): Secondary | ICD-10-CM | POA: Diagnosis not present

## 2016-04-24 DIAGNOSIS — Z0000 Encounter for general adult medical examination without abnormal findings: Secondary | ICD-10-CM | POA: Diagnosis not present

## 2016-04-24 DIAGNOSIS — D519 Vitamin B12 deficiency anemia, unspecified: Secondary | ICD-10-CM | POA: Diagnosis not present

## 2016-04-24 DIAGNOSIS — K264 Chronic or unspecified duodenal ulcer with hemorrhage: Secondary | ICD-10-CM | POA: Diagnosis not present

## 2016-04-24 DIAGNOSIS — I110 Hypertensive heart disease with heart failure: Secondary | ICD-10-CM | POA: Diagnosis not present

## 2016-04-24 DIAGNOSIS — J3089 Other allergic rhinitis: Secondary | ICD-10-CM | POA: Diagnosis not present

## 2016-04-24 DIAGNOSIS — E785 Hyperlipidemia, unspecified: Secondary | ICD-10-CM | POA: Diagnosis not present

## 2016-04-24 DIAGNOSIS — L603 Nail dystrophy: Secondary | ICD-10-CM | POA: Diagnosis not present

## 2016-04-24 DIAGNOSIS — M79604 Pain in right leg: Secondary | ICD-10-CM | POA: Diagnosis not present

## 2016-04-24 DIAGNOSIS — E876 Hypokalemia: Secondary | ICD-10-CM | POA: Diagnosis not present

## 2016-04-24 DIAGNOSIS — R319 Hematuria, unspecified: Secondary | ICD-10-CM | POA: Diagnosis not present

## 2016-04-24 DIAGNOSIS — M62838 Other muscle spasm: Secondary | ICD-10-CM | POA: Diagnosis not present

## 2016-04-24 DIAGNOSIS — K260 Acute duodenal ulcer with hemorrhage: Secondary | ICD-10-CM | POA: Diagnosis not present

## 2016-04-24 DIAGNOSIS — G43909 Migraine, unspecified, not intractable, without status migrainosus: Secondary | ICD-10-CM | POA: Diagnosis not present

## 2016-04-24 DIAGNOSIS — K6389 Other specified diseases of intestine: Secondary | ICD-10-CM | POA: Diagnosis not present

## 2016-04-24 DIAGNOSIS — H01009 Unspecified blepharitis unspecified eye, unspecified eyelid: Secondary | ICD-10-CM | POA: Diagnosis not present

## 2016-04-24 DIAGNOSIS — K5909 Other constipation: Secondary | ICD-10-CM | POA: Diagnosis present

## 2016-04-24 DIAGNOSIS — E559 Vitamin D deficiency, unspecified: Secondary | ICD-10-CM | POA: Diagnosis not present

## 2016-04-24 DIAGNOSIS — M5412 Radiculopathy, cervical region: Secondary | ICD-10-CM | POA: Diagnosis not present

## 2016-04-24 DIAGNOSIS — I5041 Acute combined systolic (congestive) and diastolic (congestive) heart failure: Secondary | ICD-10-CM | POA: Diagnosis not present

## 2016-04-24 DIAGNOSIS — N390 Urinary tract infection, site not specified: Secondary | ICD-10-CM | POA: Diagnosis not present

## 2016-04-24 DIAGNOSIS — I130 Hypertensive heart and chronic kidney disease with heart failure and stage 1 through stage 4 chronic kidney disease, or unspecified chronic kidney disease: Secondary | ICD-10-CM | POA: Diagnosis not present

## 2016-04-24 DIAGNOSIS — K562 Volvulus: Secondary | ICD-10-CM | POA: Diagnosis not present

## 2016-04-24 DIAGNOSIS — I517 Cardiomegaly: Secondary | ICD-10-CM | POA: Diagnosis not present

## 2016-04-24 DIAGNOSIS — K5900 Constipation, unspecified: Secondary | ICD-10-CM | POA: Diagnosis not present

## 2016-04-24 DIAGNOSIS — M179 Osteoarthritis of knee, unspecified: Secondary | ICD-10-CM | POA: Diagnosis not present

## 2016-04-24 DIAGNOSIS — E1040 Type 1 diabetes mellitus with diabetic neuropathy, unspecified: Secondary | ICD-10-CM | POA: Diagnosis not present

## 2016-04-24 DIAGNOSIS — F0391 Unspecified dementia with behavioral disturbance: Secondary | ICD-10-CM | POA: Diagnosis not present

## 2016-04-24 DIAGNOSIS — N3000 Acute cystitis without hematuria: Secondary | ICD-10-CM | POA: Diagnosis not present

## 2016-04-24 DIAGNOSIS — Z79899 Other long term (current) drug therapy: Secondary | ICD-10-CM | POA: Diagnosis not present

## 2016-04-24 DIAGNOSIS — I25110 Atherosclerotic heart disease of native coronary artery with unstable angina pectoris: Secondary | ICD-10-CM | POA: Diagnosis not present

## 2016-04-24 DIAGNOSIS — R51 Headache: Secondary | ICD-10-CM | POA: Diagnosis not present

## 2016-04-24 DIAGNOSIS — K219 Gastro-esophageal reflux disease without esophagitis: Secondary | ICD-10-CM | POA: Diagnosis not present

## 2016-04-24 DIAGNOSIS — J9611 Chronic respiratory failure with hypoxia: Secondary | ICD-10-CM | POA: Diagnosis not present

## 2016-04-24 DIAGNOSIS — F05 Delirium due to known physiological condition: Secondary | ICD-10-CM | POA: Diagnosis not present

## 2016-04-24 DIAGNOSIS — D696 Thrombocytopenia, unspecified: Secondary | ICD-10-CM | POA: Diagnosis not present

## 2016-04-24 DIAGNOSIS — M545 Low back pain: Secondary | ICD-10-CM | POA: Diagnosis not present

## 2016-04-24 DIAGNOSIS — N183 Chronic kidney disease, stage 3 (moderate): Secondary | ICD-10-CM | POA: Diagnosis not present

## 2016-04-24 DIAGNOSIS — R002 Palpitations: Secondary | ICD-10-CM | POA: Diagnosis not present

## 2016-04-24 DIAGNOSIS — Z86718 Personal history of other venous thrombosis and embolism: Secondary | ICD-10-CM | POA: Diagnosis not present

## 2016-04-24 DIAGNOSIS — D508 Other iron deficiency anemias: Secondary | ICD-10-CM | POA: Diagnosis not present

## 2016-04-24 DIAGNOSIS — M25559 Pain in unspecified hip: Secondary | ICD-10-CM | POA: Diagnosis not present

## 2016-04-24 DIAGNOSIS — N3001 Acute cystitis with hematuria: Secondary | ICD-10-CM | POA: Diagnosis not present

## 2016-04-24 DIAGNOSIS — R0781 Pleurodynia: Secondary | ICD-10-CM | POA: Diagnosis not present

## 2016-04-24 DIAGNOSIS — I509 Heart failure, unspecified: Secondary | ICD-10-CM | POA: Diagnosis not present

## 2016-04-24 DIAGNOSIS — R1084 Generalized abdominal pain: Secondary | ICD-10-CM | POA: Diagnosis not present

## 2016-04-24 DIAGNOSIS — F339 Major depressive disorder, recurrent, unspecified: Secondary | ICD-10-CM | POA: Diagnosis not present

## 2016-04-24 DIAGNOSIS — F411 Generalized anxiety disorder: Secondary | ICD-10-CM | POA: Diagnosis not present

## 2016-04-24 DIAGNOSIS — D649 Anemia, unspecified: Secondary | ICD-10-CM | POA: Diagnosis not present

## 2016-04-24 DIAGNOSIS — G44229 Chronic tension-type headache, not intractable: Secondary | ICD-10-CM | POA: Diagnosis not present

## 2016-04-24 DIAGNOSIS — M542 Cervicalgia: Secondary | ICD-10-CM | POA: Diagnosis not present

## 2016-04-24 DIAGNOSIS — J449 Chronic obstructive pulmonary disease, unspecified: Secondary | ICD-10-CM | POA: Diagnosis not present

## 2016-04-24 DIAGNOSIS — K224 Dyskinesia of esophagus: Secondary | ICD-10-CM | POA: Diagnosis present

## 2016-04-24 DIAGNOSIS — J309 Allergic rhinitis, unspecified: Secondary | ICD-10-CM | POA: Diagnosis not present

## 2016-04-24 DIAGNOSIS — R627 Adult failure to thrive: Secondary | ICD-10-CM | POA: Diagnosis not present

## 2016-04-24 DIAGNOSIS — E1159 Type 2 diabetes mellitus with other circulatory complications: Secondary | ICD-10-CM | POA: Diagnosis not present

## 2016-04-24 DIAGNOSIS — M79671 Pain in right foot: Secondary | ICD-10-CM | POA: Diagnosis not present

## 2016-04-24 DIAGNOSIS — I2510 Atherosclerotic heart disease of native coronary artery without angina pectoris: Secondary | ICD-10-CM | POA: Diagnosis not present

## 2016-04-24 DIAGNOSIS — K5901 Slow transit constipation: Secondary | ICD-10-CM | POA: Diagnosis not present

## 2016-04-24 DIAGNOSIS — J301 Allergic rhinitis due to pollen: Secondary | ICD-10-CM | POA: Diagnosis not present

## 2016-04-24 DIAGNOSIS — H401134 Primary open-angle glaucoma, bilateral, indeterminate stage: Secondary | ICD-10-CM | POA: Diagnosis not present

## 2016-04-24 DIAGNOSIS — R5383 Other fatigue: Secondary | ICD-10-CM | POA: Diagnosis not present

## 2016-04-24 LAB — GLUCOSE, CAPILLARY
GLUCOSE-CAPILLARY: 115 mg/dL — AB (ref 65–99)
Glucose-Capillary: 116 mg/dL — ABNORMAL HIGH (ref 65–99)
Glucose-Capillary: 138 mg/dL — ABNORMAL HIGH (ref 65–99)
Glucose-Capillary: 114 mg/dL — ABNORMAL HIGH (ref 65–99)

## 2016-04-24 LAB — BASIC METABOLIC PANEL
Anion gap: 5 (ref 5–15)
BUN: 17 mg/dL (ref 6–20)
CHLORIDE: 112 mmol/L — AB (ref 101–111)
CO2: 21 mmol/L — ABNORMAL LOW (ref 22–32)
Calcium: 8.7 mg/dL — ABNORMAL LOW (ref 8.9–10.3)
Creatinine, Ser: 1.05 mg/dL — ABNORMAL HIGH (ref 0.44–1.00)
GFR calc Af Amer: 53 mL/min — ABNORMAL LOW (ref >60)
GFR calc non Af Amer: 46 mL/min — ABNORMAL LOW (ref >60)
GLUCOSE: 132 mg/dL — AB (ref 65–99)
POTASSIUM: 4.3 mmol/L (ref 3.5–5.1)
SODIUM: 138 mmol/L (ref 135–145)

## 2016-04-24 MED ORDER — HYDROCODONE-ACETAMINOPHEN 5-325 MG PO TABS
1.0000 | ORAL_TABLET | ORAL | 0 refills | Status: DC | PRN
Start: 2016-04-24 — End: 2016-04-25

## 2016-04-24 MED ORDER — METHOCARBAMOL 1000 MG/10ML IJ SOLN
500.0000 mg | Freq: Once | INTRAVENOUS | Status: AC
  Administered 2016-04-24: 500 mg via INTRAVENOUS
  Filled 2016-04-24: qty 550

## 2016-04-24 MED ORDER — POTASSIUM CHLORIDE CRYS ER 20 MEQ PO TBCR: 40.0000 meq | EXTENDED_RELEASE_TABLET | Freq: Every day | ORAL | Status: DC

## 2016-04-24 NOTE — Progress Notes (Signed)
Report called to [place] at [place].

## 2016-04-24 NOTE — Clinical Social Work Placement (Addendum)
D/C Summary Faxed.   CLINICAL SOCIAL WORK PLACEMENT  NOTE  Date:  04/24/2016  Patient Details  Name: Maria Christian MRN: [id number] Date of Birth: Oct 11, 1924  Clinical Social Work is seeking post-discharge placement for this patient at the [place] level of care (*CSW will initial, date and re-position this form in  chart as items are completed):  No   Patient/family provided with [facility] Work Department's list of facilities offering this level of care within the geographic area requested by the patient (or if unable, by the patient's family).  No   Patient/family informed of their freedom to choose among providers that offer the needed level of care, that participate in Medicare, Medicaid or managed care program needed by the patient, have an available bed and are willing to accept the patient.  No   Patient/family informed of [facility]'s ownership interest in [facility] and [facility], as well as of the fact that they are under no obligation to receive care at these facilities.  PASRR submitted to EDS on       PASRR number received on       Existing PASRR number confirmed on 04/24/16     FL2 transmitted to all facilities in geographic area requested by pt/family on       FL2 transmitted to all facilities within larger geographic area on       Patient informed that his/her managed care company has contracts with or will negotiate with certain facilities, including the following:        Yes   Patient/family informed of bed offers received.  Patient chooses bed at    [facility]  Physician recommends and patient chooses bed at     [facility] Patient to be transferred to   on 04/24/16.  Patient to be transferred to facility by [facility]     Patient family notified on 04/24/16 of transfer.  Name of family member notified:  Jerolyn Center  [place] Please sign DNR     Additional Comment:   Patient is a a longterm resident at [facility]  _______________________________________________ Lia Hopping, [place] 04/24/2016, 9:48 AM

## 2016-04-24 NOTE — Discharge Summary (Signed)
Physician Discharge Summary  Maria Christian [id number] DOB: Jun 06, 1924 DOA: 04/17/2016  PCP: [name], DO  Admit date: 04/17/2016 Discharge date: 04/24/2016  Admitted From: SNF. Disposition:  snf.  Recommendations for Outpatient Follow-up:  1. Follow up with PCP in 1-2 weeks 2. Please obtain BMP/CBC in tomorrow to check hemoglobin and potassium   Discharge Condition:STABLE.  CODE STATUS:DNR. Diet recommendation: Heart Healthy  Brief/Interim Summary: 82 y.o.femaleSNF resident,bed to wheelchair bound, dementia, Parkinson's disease, atrial fibrillation not on anticoagulation, HTN, diastolic CHF, CAD, GERD,CKD, DM2, recurrent volvulus presented with 2 days hx of abdominal pain Nausea and vomiting. Found to have volvulus status post decompression by gastroenterologist.   Discharge Diagnoses:  Active Problems:   Diastolic CHF, chronic (HCC)   Coronary artery disease   Dementia without behavioral disturbance   Anemia of chronic disease   Chronic atrial fibrillation (HCC)   CKD (chronic kidney disease) stage 3, GFR 30-59 ml/min   Chronic respiratory failure (HCC)   Chronic pain   Sigmoid volvulus (HCC)   DM (diabetes mellitus), type 2 (HCC)   Dilatation of colon  #Recurrent sigmoid volvulus:  -. Status post sigmoidoscopy and decompression on admission on 11/13. Repeat abdominal x-ray on 11/14 showed distention of sigmoid colon which is compatible with sigmoid volvulus. Patient underwent second sigmoidoscopy and complete decompression  On 11/14.  - [facility], general surgery and palliative care about goals of care, family decided not do surgery, they want continue conservative management including repeat decompression by endoscope if needed in the future .   #Hypokalemia: Repleting potassium,  and mag  Repeat K is 4.2. Mag is 2. Recommendations to keep K greater than 4.    #  Chronic atrial fibrillation ([place]): rate controlled. Patient is not on systemic anticoagulant.    resume coreg today as her pressure improved.     #Essential hypertension:  On low-dose amlodipine and Coreg which were held for borderline bp on 11/17. Resume coreg and norvasc.   #CKDIII: cr seems at baseline  #  Dementia without behavioral disturbance/FTT: baseline wheelchair bound for the last 11-12years, long term SNF resident, has sundowning symptoms in the hospital ,  Continue to provide supportive care.  Cough: reported on 11/16  cxr on 11/15 no acute changes, no fever, no leukocytosis No cough today.    Neck pain: since many months, ordered CT cervical spine, showed degenerative changes.   Discharge Instructions  Discharge Instructions    Diet - low sodium heart healthy    Complete by:  As directed    Discharge instructions    Complete by:  As directed    Please follow up with PCP in one week.       Medication List    TAKE these medications   amLODipine 2.5 MG tablet Commonly known as:  NORVASC Take 2.5 mg by mouth every morning. Hold for BP < 100/60 or HR < 60   aspirin EC 81 MG tablet Take 81 mg by mouth daily.   baclofen 10 MG tablet Commonly known as:  LIORESAL Take 5 mg by mouth at bedtime.   Biotin 5 MG/ML Liqd Take 5 mLs by mouth 2 (two) times daily.   CALAZIME SKIN PROTECTANT EX Apply 1 application topically daily. Buttocks protection   carvedilol 3.125 MG tablet Commonly known as:  COREG Take 3.125 mg by mouth daily.   CERTAGEN PO Take 1 tablet by mouth daily.   CHLORASEPTIC 6-10 MG lozenge Generic drug:  benzocaine-menthol Take 1 lozenge by mouth as needed for  sore throat.   cholecalciferol 1000 units tablet Commonly known as:  VITAMIN D Take 1,000 Units by mouth every morning.   Cranberry 475 MG Caps Take 1 capsule by mouth 2 (two) times daily.   docusate sodium 100 MG capsule Commonly known as:  COLACE Take 200 mg by mouth at bedtime.   dorzolamide 2 % ophthalmic solution Commonly known as:  TRUSOPT Place 1 drop into both  eyes 3 (three) times daily. Reported on 11/16/2015        0800,1400,2000   escitalopram 10 MG tablet Commonly known as:  LEXAPRO Take 1 tablet (10 mg total) by mouth daily.   feeding supplement (ENSURE ENLIVE) Liqd Take 237 mLs by mouth 2 (two) times daily between meals.   ferrous sulfate 325 (65 FE) MG tablet Take 325 mg by mouth daily with breakfast.   fluticasone 50 MCG/ACT nasal spray Commonly known as:  FLONASE Place 2 sprays into both nostrils at bedtime.   HYDROcodone-acetaminophen 5-325 MG tablet Commonly known as:  NORCO/VICODIN Take 1-2 tablets by mouth every 4 (four) hours as needed for moderate pain.   ipratropium-albuterol 0.5-2.5 (3) MG/3ML Soln Commonly known as:  DUONEB Take 3 mLs by nebulization daily as needed (wheezing).   lidocaine 5 % Commonly known as:  LIDODERM Place 1 patch onto the skin daily. Apply 1 patch to left lower back/hip every morning.  Remove & Discard patch within 12 hours or as directed by MD   LINZESS 145 MCG Caps capsule Generic drug:  linaclotide Take 145 mcg by mouth daily before breakfast.   loratadine 10 MG tablet Commonly known as:  CLARITIN Take 10 mg by mouth daily before breakfast.   ondansetron 8 MG tablet Commonly known as:  ZOFRAN Take 8 mg by mouth every 8 (eight) hours as needed for nausea or vomiting.   OXYGEN Inhale 2 L into the lungs continuous.   polyethylene glycol packet Commonly known as:  MIRALAX / GLYCOLAX Take 17 g by mouth daily at 6 (six) AM.   potassium chloride SA 20 MEQ tablet Commonly known as:  K-DUR,KLOR-CON Take 2 tablets (40 mEq total) by mouth daily. What changed:  how much to take   PROTONIX 40 mg/20 mL Pack Generic drug:  pantoprazole sodium Take 40 mg by mouth daily at 6 (six) AM.   senna 8.6 MG Tabs tablet Commonly known as:  SENOKOT Take 1 tablet (8.6 mg total) by mouth 2 (two) times daily.   sodium chloride 0.65 % Soln nasal spray Commonly known as:  OCEAN Place 1 spray into  both nostrils as directed. Four times daily And every 24 hours as needed to moisturize nasal passages.   torsemide 20 MG tablet Commonly known as:  DEMADEX Take 20 mg by mouth daily.   Travoprost (BAK Free) 0.004 % Soln ophthalmic solution Commonly known as:  TRAVATAN Place 1 drop into both eyes daily at 8 pm.   vitamin B-12 500 MCG tablet Commonly known as:  CYANOCOBALAMIN Take 500 mcg by mouth every morning.      Follow-up Information    [name], DO. Schedule an appointment as soon as possible for a visit in 1 week(s).   Specialty:  Internal Medicine Contact information: [place] [id number] [phone]          Allergies  Allergen Reactions  . Aspirin Other (See Comments)    [name]    Consultations:  Gastroenterology  Surgery.   Procedures/Studies: Ct Abdomen Pelvis Wo Contrast  Result Date: 04/17/2016 CLINICAL DATA:  Abdominal pain for 3 days, abdominal distention, absent bowel sounds. Bowel movement yesterday. History of small bowel obstruction and volvulus. EXAM: CT ABDOMEN AND PELVIS WITHOUT CONTRAST TECHNIQUE: Multidetector CT imaging of the abdomen and pelvis was performed following the standard protocol without IV contrast. COMPARISON:  CT abdomen and pelvis March 06, 2016 FINDINGS: LOWER CHEST: Similar bronchial wall thickening. The heart is enlarged with small pericardial effusion. Severe coronary artery and mitral annular calcifications. HEPATOBILIARY: The liver is normal, status post cholecystectomy. PANCREAS: Atrophic pancreas, otherwise unremarkable. SPLEEN: Normal. ADRENALS/URINARY TRACT: Horseshoe kidney. 2 mm LEFT lower pole nephrolithiasis without hydronephrosis. Limited assessment for renal mass by noncontrast CT. Urinary bladder is well distended, unremarkable. Normal adrenal glands. STOMACH/BOWEL: Sigmoid volvulus with swirled mesentery, proximal sigmoid and gas distended, 8.8 cm in transaxial dimension. Rectal  air-fluid level. Small air-filled duodenum diverticulum. VASCULAR/LYMPHATIC: Aortoiliac vessels are normal in course and caliber, severe calcific atherosclerosis. No lymphadenopathy by CT size criteria. REPRODUCTIVE: Normal. OTHER: No intraperitoneal free fluid or free air. MUSCULOSKELETAL: Non-acute. Status post LEFT hip total arthroplasty. Osteopenia. Multilevel severe facet arthropathy. Anterior pelvic wall scarring. IMPRESSION: Sigmoid volvulus. 2 mm nonobstructing LEFT nephrolithiasis.  Horseshoe kidney. Severe atherosclerosis. Acute findings discussed with and reconfirmed by [facility] REES on 04/17/2016 at 5:28 pm. Electronically Signed   By: Elon Alas M.D.   On: 04/17/2016 17:28   Dg Chest 1 View  Result Date: 04/20/2016 CLINICAL DATA:  Cough.  History of atrial fibrillation. EXAM: CHEST 1 VIEW COMPARISON:  04/19/2016. FINDINGS: Stable enlarged cardiac silhouette. Left basilar scarring without significant change. Diffuse osteopenia. Aortic calcifications. IMPRESSION: 1. No acute abnormality. 2. Stable cardiomegaly and aortic atherosclerosis. Electronically Signed   By: Claudie Revering M.D.   On: 04/20/2016 17:26   Ct Cervical Spine Wo Contrast  Result Date: 04/23/2016 CLINICAL DATA:  Chronic neck pain. EXAM: CT CERVICAL SPINE WITHOUT CONTRAST TECHNIQUE: Multidetector CT imaging of the cervical spine was performed without intravenous contrast. Multiplanar CT image reconstructions were also generated. COMPARISON:  None. FINDINGS: Alignment: There is reversal of normal lordosis centered at C5-6. No other malalignment. Skull base and vertebrae: No fractures. Soft tissues and spinal canal: Atherosclerotic change in the carotid arteries bilaterally. No other soft tissue abnormalities. Disc levels: Multilevel degenerative changes are seen. There is calcified hypertrophy of the ligaments along the posterior aspect of the odontoid process. This narrows the canal at the C1 level to an AP diameter of  10.5 mm. Small posterior disc bulges are identified at several levels. A disc osteophyte complex at C4-5 narrows the cervical canal at 8 mm. Moderate anterior osteophytes are seen at multiple levels. Facet degenerative changes are seen, particularly in the upper cervical spine to the right. Severe neural foraminal narrowing is seen at on the right at C3-4 and C4-5 with more mild narrowing on the right at C5-6. Mild left neural foraminal narrowing is seen at C4-5. Upper chest: Bilateral pleural effusions identified. No other abnormalities. Other: No other abnormalities. IMPRESSION: 1. No fracture or malalignment. 2. Multilevel degenerative changes as described above with canal narrowing most prominent at C4-5 measuring 8 mm in AP dimension. Neural foraminal narrowing, right greater than left, at multiple levels as described above. Electronically Signed   By: Dorise Bullion III M.D   On: 04/23/2016 11:55   Dg Abd 2 Views  Result Date: 04/18/2016 CLINICAL DATA:  Abdominal pain and distention for several weeks. Evidence for a sigmoid volvulus on recent CT. EXAM: ABDOMEN - 2 VIEW COMPARISON:  12/20/2015 and abdominal CT 04/17/2016 FINDINGS: Again noted are dilated gas-filled loops of sigmoid colon in the right abdomen. The configuration would be compatible with a sigmoid volvulus and not changed from the recent CT. There continues to be gas-filled transverse colon. There is no evidence for free air. Again noted is a left hip arthroplasty. Surgical clips in the right upper abdomen. IMPRESSION: Stable distension of the sigmoid colon and the gas pattern would be compatible with a sigmoid volvulus. No change from the recent CT exam. No evidence for free air. Electronically Signed   By: Markus Daft M.D.   On: 04/18/2016 10:15   Dg Abd Acute W/chest  Result Date: 04/19/2016 CLINICAL DATA:  Sigmoid volvulus EXAM: DG ABDOMEN ACUTE W/ 1V CHEST COMPARISON:  Abdominal CT from 2 days ago FINDINGS: Sigmoid volvulus pattern  with improved gaseous distension of the sigmoid colon. Luminal diameter is up to 9 cm today, previously 11 cm. No small bowel obstruction. No evidence of pneumoperitoneum. No concerning mass effect or calcification. Cardiomegaly. There is no edema, consolidation, effusion, or pneumothorax. IMPRESSION: Sigmoid volvulus with improving gaseous distension. No evidence of free air or pneumatosis. Electronically Signed   By: Monte Fantasia M.D.   On: 04/19/2016 12:43     Subjective: No new complaints.   Discharge Exam: Vitals:   04/24/16 0429 04/24/16 1052  BP: (!) 148/71 140/66  Pulse: 78 73  Resp: 20 20  Temp: 98.5 F (36.9 C) 99.9 F (37.7 C)   Vitals:   04/23/16 1405 04/23/16 2020 04/24/16 0429 04/24/16 1052  BP: 136/63 123/65 (!) 148/71 140/66  Pulse: 76 (!) 59 78 73  Resp: 18  20 20   Temp: 98.6 F (37 C) 99.2 F (37.3 C) 98.5 F (36.9 C) 99.9 F (37.7 C)  TempSrc: Oral Oral Oral Oral  SpO2: 98% 95% 95% 99%  Weight:      Height:        General: Pt is alert, awake, not in acute distress Cardiovascular: RRR, S1/S2 +, no rubs, no gallops Respiratory: CTA bilaterally, no wheezing, no rhonchi Abdominal: Soft, NT, ND, bowel sounds + Extremities: no edema, no cyanosis    The results of significant diagnostics from this hospitalization (including imaging, microbiology, ancillary and laboratory) are listed below for reference.     Microbiology: Recent Results (from the past 240 hour(s))  MRSA PCR Screening     Status: None   Collection Time: 04/17/16 11:40 PM  Result Value Ref Range Status   MRSA by PCR NEGATIVE NEGATIVE Final    Comment:        The GeneXpert MRSA Assay (FDA approved for NASAL specimens only), is one component of a comprehensive MRSA colonization surveillance program. It is not intended to diagnose MRSA infection nor to guide or monitor treatment for MRSA infections.      Labs: BNP (last 3 results) No results for input(s): BNP in the last 8760  hours. Basic Metabolic Panel:  Recent Labs Lab 04/18/16 0448  04/20/16 0434 04/21/16 0500 04/22/16 1150 04/23/16 0939 04/24/16 0453  NA 141  < > 143 139 140 138 138  K 2.9*  < > 3.7 2.9* 3.8 3.8 4.3  CL 112*  < > 114* 110 112* 111 112*  CO2 24  < > 25 24 23 22  21*  GLUCOSE 124*  < > 103* 124* 197* 172* 132*  BUN 17  < > 14 14 16 15 17   CREATININE 1.20*  < > 1.00 1.10* 1.15* 1.02* 1.05*  CALCIUM 8.5*  < > 9.2 8.7* 8.7* 8.5* 8.7*  MG 1.7  --  1.8 1.6* 2.0  --   --   PHOS 3.7  --   --   --   --   --   --   < > = values in this interval not displayed. Liver Function Tests:  Recent Labs Lab 04/17/16 1551 04/18/16 0448  AST 20 17  ALT 12* 10*  ALKPHOS 89 69  BILITOT 0.8 0.6  PROT 7.2 5.8*  ALBUMIN 3.8 3.2*   No results for input(s): LIPASE, AMYLASE in the last 168 hours. No results for input(s): AMMONIA in the last 168 hours. CBC:  Recent Labs Lab 04/17/16 1551 04/18/16 0448 04/19/16 0455 04/20/16 0434 04/21/16 0500  WBC 5.0 4.0 4.2 5.5 5.2  NEUTROABS 3.0  --   --   --   --   HGB 11.1* 9.3* 9.7* 10.2* 9.7*  HCT 33.3* 28.8* 29.5* 30.9* 29.1*  MCV 100.6* 101.1* 104.2* 100.0 102.5*  PLT 119* 93* 95* 97* 97*   Cardiac Enzymes: No results for input(s): CKTOTAL, CKMB, CKMBINDEX, TROPONINI in the last 168 hours. BNP: Invalid input(s): POCBNP CBG:  Recent Labs Lab 04/23/16 2026 04/23/16 2352 04/24/16 0428 04/24/16 0742 04/24/16 1205  GLUCAP 149* 136* 138* 114* 115*   D-Dimer No results for input(s): DDIMER in the last 72 hours. Hgb A1c No results for input(s): HGBA1C in the last 72 hours. Lipid Profile No results for input(s): CHOL, HDL, LDLCALC, TRIG, CHOLHDL, LDLDIRECT in the last 72 hours. Thyroid function studies No results for input(s): TSH, T4TOTAL, T3FREE, THYROIDAB in the last 72 hours.  Invalid input(s): FREET3 Anemia work up No results for input(s): VITAMINB12, FOLATE, FERRITIN, TIBC, IRON, RETICCTPCT in the last 72 hours. Urinalysis     Component Value Date/Time   COLORURINE YELLOW 03/06/2016 0409   APPEARANCEUR TURBID (A) 03/06/2016 0409   LABSPEC 1.013 03/06/2016 0409   PHURINE 5.5 03/06/2016 0409   GLUCOSEU NEGATIVE 03/06/2016 0409   HGBUR MODERATE (A) 03/06/2016 0409   BILIRUBINUR NEGATIVE 03/06/2016 0409   KETONESUR NEGATIVE 03/06/2016 0409   PROTEINUR 30 (A) 03/06/2016 0409   UROBILINOGEN 0.2 03/09/2015 1352   NITRITE NEGATIVE 03/06/2016 0409   LEUKOCYTESUR LARGE (A) 03/06/2016 0409   Sepsis Labs Invalid input(s): PROCALCITONIN,  WBC,  LACTICIDVEN Microbiology Recent Results (from the past 240 hour(s))  MRSA PCR Screening     Status: None   Collection Time: 04/17/16 11:40 PM  Result Value Ref Range Status   MRSA by PCR NEGATIVE NEGATIVE Final    Comment:        The GeneXpert MRSA Assay (FDA approved for NASAL specimens only), is one component of a comprehensive MRSA colonization surveillance program. It is not intended to diagnose MRSA infection nor to guide or monitor treatment for MRSA infections.      Time coordinating discharge: Over 30 minutes  SIGNED:   Hosie Poisson, MD  [facility] 04/24/2016, 12:38 PM Pager   If 7PM-7AM, please contact night-coverage [email or web address] Password [email or web address]

## 2016-04-24 NOTE — Progress Notes (Signed)
Date: April 24, 2016 Discharge orders checked for needs. No case management needs present at time of discharge. Velva Harman, RN, BSN, [place]   [phone]

## 2016-04-25 ENCOUNTER — Encounter: Payer: Self-pay | Admitting: Adult Health

## 2016-04-25 ENCOUNTER — Other Ambulatory Visit: Payer: Self-pay | Admitting: *Deleted

## 2016-04-25 ENCOUNTER — Non-Acute Institutional Stay (SKILLED_NURSING_FACILITY): Payer: Medicare Other | Admitting: Adult Health

## 2016-04-25 DIAGNOSIS — J9611 Chronic respiratory failure with hypoxia: Secondary | ICD-10-CM

## 2016-04-25 DIAGNOSIS — K5901 Slow transit constipation: Secondary | ICD-10-CM

## 2016-04-25 DIAGNOSIS — I482 Chronic atrial fibrillation, unspecified: Secondary | ICD-10-CM

## 2016-04-25 DIAGNOSIS — K562 Volvulus: Secondary | ICD-10-CM

## 2016-04-25 DIAGNOSIS — E876 Hypokalemia: Secondary | ICD-10-CM | POA: Diagnosis not present

## 2016-04-25 DIAGNOSIS — I5032 Chronic diastolic (congestive) heart failure: Secondary | ICD-10-CM | POA: Diagnosis not present

## 2016-04-25 DIAGNOSIS — M150 Primary generalized (osteo)arthritis: Secondary | ICD-10-CM | POA: Diagnosis not present

## 2016-04-25 DIAGNOSIS — N183 Chronic kidney disease, stage 3 unspecified: Secondary | ICD-10-CM

## 2016-04-25 DIAGNOSIS — D638 Anemia in other chronic diseases classified elsewhere: Secondary | ICD-10-CM | POA: Diagnosis not present

## 2016-04-25 DIAGNOSIS — I110 Hypertensive heart disease with heart failure: Secondary | ICD-10-CM | POA: Diagnosis not present

## 2016-04-25 DIAGNOSIS — K219 Gastro-esophageal reflux disease without esophagitis: Secondary | ICD-10-CM

## 2016-04-25 DIAGNOSIS — M159 Polyosteoarthritis, unspecified: Secondary | ICD-10-CM

## 2016-04-25 MED ORDER — HYDROCODONE-ACETAMINOPHEN 5-325 MG PO TABS: 1.0000 | ORAL_TABLET | ORAL | 0 refills | Status: DC | PRN

## 2016-04-25 NOTE — Progress Notes (Signed)
Patient ID: Maria Christian, female   DOB: [birth year], 82 y.o.   MRN: [id number]    Location:   [facility] Room Number: 220-A Place of Service:  SNF (31)   CODE STATUS: DNR  Allergies  Allergen Reactions  . Aspirin Other (See Comments)    [name]    Chief Complaint  Patient presents with  . Hospitalization Follow-up    Hospital Follow up    HPI:  She is a long term resident of this facility who was hospitalized due to recurrent volvulus. She was decompressed via flex sigmoidoscopy on 11-13 and 11-14 -17.her family has decided upon conservative treatment with flex sigmmoidoscopy in the future if she needs.   She tells me that she is happy to be home . She is not voicing any complaints at this time. There are no nursing concerns at this time.     Past Medical History:  Diagnosis Date  . Altered mental status   . Anemia   . Atrial fibrillation ([place])   . Constipation 02/27/2015  . Coronary artery disease   . DEMENTIA   . Depression   . Diabetes mellitus   . Edema of lower extremity 07/13/11   right leg more swollen than left leg  . Encephalopathy   . Enlarged heart   . Esophageal dysmotility 07/02/12  . GERD (gastroesophageal reflux disease)   . History of adenomatous polyp of colon 06/24/99  . Horseshoe kidney   . Hyperlipemia   . Hypertension   . Pancreatic lesion 05/22/11   no further workup  per PCP/family due to age  . Parkinson disease ([place])   . Peripheral neuropathy ([place])   . Sigmoid volvulus ([place]) 05/24/2013  . Thrombophlebitis   . TIA (transient ischemic attack) 12/19/2012    Past Surgical History:  Procedure Laterality Date  . ABDOMINAL HYSTERECTOMY    . BOWEL DECOMPRESSION N/A 04/18/2016   Procedure: BOWEL DECOMPRESSION;  Surgeon: [name], MD;  Location: [facility];  Service: Endoscopy;  Laterality: N/A;  . BREAST SURGERY     2 benign tumors removed left breast  . CHOLECYSTECTOMY    . ESOPHAGEAL DILATION     several times by  Dr. [name]  . ESOPHAGOGASTRODUODENOSCOPY (EGD) WITH ESOPHAGEAL DILATION N/A 08/01/2012   Procedure: ESOPHAGOGASTRODUODENOSCOPY (EGD) WITH ESOPHAGEAL DILATION;  Surgeon: [name], MD;  Location: [facility];  Service: Endoscopy;  Laterality: N/A;  with c-arm savory dilators  . ESOPHAGOGASTRODUODENOSCOPY (EGD) WITH PROPOFOL N/A 03/12/2015   Procedure: ESOPHAGOGASTRODUODENOSCOPY (EGD) WITH PROPOFOL;  Surgeon: [name], MD;  Location: [facility];  Service: Endoscopy;  Laterality: N/A;  . FLEXIBLE SIGMOIDOSCOPY N/A 05/24/2013   Procedure: FLEXIBLE SIGMOIDOSCOPY;  Surgeon: [name], MD;  Location: [facility];  Service: Endoscopy;  Laterality: N/A;  . FLEXIBLE SIGMOIDOSCOPY N/A 05/26/2013   Procedure:  flex with decompression of sigmoid volvulus;  Surgeon: [name], MD;  Location: [facility];  Service: Endoscopy;  Laterality: N/A;  . FLEXIBLE SIGMOIDOSCOPY N/A 12/20/2015   Procedure: FLEXIBLE SIGMOIDOSCOPY;  Surgeon: Milus Banister, MD;  Location: [facility];  Service: Endoscopy;  Laterality: N/A;  . FLEXIBLE SIGMOIDOSCOPY N/A 04/17/2016   Procedure: FLEXIBLE SIGMOIDOSCOPY;  Surgeon: Ladene Artist, MD;  Location: [facility];  Service: Endoscopy;  Laterality: N/A;  . FLEXIBLE SIGMOIDOSCOPY N/A 04/18/2016   Procedure: FLEXIBLE SIGMOIDOSCOPY;  Surgeon: [name], MD;  Location: [facility];  Service: Endoscopy;  Laterality: N/A;  . FOOT SURGERY     benign  tumors from foot  . NOSE SURGERY    . SHOULDER SURGERY  2001   left clavicle excision and acromioplasty  . TOTAL HIP ARTHROPLASTY      Social History   Social History  . Marital status: Widowed    Spouse name: N/A  . Number of children: 5  . Years of education: 10th   Occupational History  . Retired    Social History Main Topics  . Smoking status: Former Smoker    Quit date: 10/09/1964  . Smokeless tobacco: Never Used  . Alcohol use No  . Drug use: No  . Sexual activity: No   Other Topics  Concern  . Not on file   Social History Narrative   Pt lives at [facility] and [place].   Caffeine Use: very small amount daily   Family History  Problem Relation Age of Onset  . Cancer    . Heart disease        VITAL SIGNS BP 115/84   Pulse 84   Temp 98.2 F (36.8 C) (Oral)   Resp 18   Ht 5\' 7"  (1.702 m)   Wt 151 lb 4 oz (68.6 kg)   SpO2 98%   BMI 23.69 kg/m   Patient's Medications  New Prescriptions   No medications on file  Previous Medications   AMLODIPINE (NORVASC) 2.5 MG TABLET    Take 2.5 mg by mouth every morning. Hold for BP < 100/60 or HR < 60   ASPIRIN EC 81 MG TABLET    Take 81 mg by mouth daily.   BACLOFEN (LIORESAL) 10 MG TABLET    Take 5 mg by mouth at bedtime.   BENZOCAINE-MENTHOL (CHLORASEPTIC) 6-10 MG LOZENGE    Take 1 lozenge by mouth as needed for sore throat.   BIOTIN 5 MG/ML LIQD    Take 5 mLs by mouth 2 (two) times daily.    CARVEDILOL (COREG) 3.125 MG TABLET    Take 3.125 mg by mouth daily.   CHOLECALCIFEROL (VITAMIN D) 1000 UNITS TABLET    Take 1,000 Units by mouth every morning.    CRANBERRY 475 MG CAPS    Take 1 capsule by mouth 2 (two) times daily.    DOCUSATE SODIUM (COLACE) 100 MG CAPSULE    Take 200 mg by mouth at bedtime.    DORZOLAMIDE (TRUSOPT) 2 % OPHTHALMIC SOLUTION    Place 1 drop into both eyes 3 (three) times daily. Reported on 11/16/2015        0800,1400,2000   FEEDING SUPPLEMENT, ENSURE ENLIVE, (ENSURE ENLIVE) LIQD    Take 237 mLs by mouth 2 (two) times daily between meals.   FERROUS SULFATE 325 (65 FE) MG TABLET    Take 325 mg by mouth daily with breakfast.   FLUTICASONE (FLONASE) 50 MCG/ACT NASAL SPRAY    Place 2 sprays into both nostrils at bedtime.   HYDROCODONE-ACETAMINOPHEN (NORCO/VICODIN) 5-325 MG TABLET    Take 1-2 tablets by mouth every 4 (four) hours as needed for moderate pain.   IPRATROPIUM-ALBUTEROL (DUONEB) 0.5-2.5 (3) MG/3ML SOLN    Take 3 mLs by nebulization daily as needed (wheezing).   LIDOCAINE (LIDODERM)  5 %    Place 1 patch onto the skin daily. Apply 1 patch to left lower back/hip every morning.  Remove & Discard patch within 12 hours or as directed by MD   LINACLOTIDE (LINZESS) 145 MCG CAPS CAPSULE    Take 145 mcg by mouth daily before breakfast.    LORATADINE (  CLARITIN) 10 MG TABLET    Take 10 mg by mouth daily before breakfast.    MULTIPLE VITAMINS-MINERALS (CERTAGEN PO)    Take 1 tablet by mouth daily.    ONDANSETRON (ZOFRAN) 8 MG TABLET    Take 8 mg by mouth every 8 (eight) hours as needed for nausea or vomiting.   OXYGEN    Inhale 2 L into the lungs continuous.   POLYETHYLENE GLYCOL (MIRALAX / GLYCOLAX) PACKET    Take 17 g by mouth daily at 6 (six) AM.    POTASSIUM CHLORIDE SA (K-DUR,KLOR-CON) 20 MEQ TABLET    Take 2 tablets (40 mEq total) by mouth daily.   PROTONIX 40 MG PACK    Take 40 mg by mouth daily at 6 (six) AM.   SENNA (SENOKOT) 8.6 MG TABS TABLET    Take 1 tablet (8.6 mg total) by mouth 2 (two) times daily.   SKIN PROTECTANTS, MISC. (CALAZIME SKIN PROTECTANT EX)    Apply 1 application topically daily. Buttocks protection   SODIUM CHLORIDE (OCEAN) 0.65 % SOLN NASAL SPRAY    Place 1 spray into both nostrils as directed. Four times daily And every 24 hours as needed to moisturize nasal passages.   TORSEMIDE (DEMADEX) 20 MG TABLET    Take 20 mg by mouth daily.   TRAVOPROST, BAK FREE, (TRAVATAN) 0.004 % SOLN OPHTHALMIC SOLUTION    Place 1 drop into both eyes daily at 8 pm.    VITAMIN B-12 (CYANOCOBALAMIN) 500 MCG TABLET    Take 500 mcg by mouth every morning.   Modified Medications   No medications on file  Discontinued Medications   ESCITALOPRAM (LEXAPRO) 10 MG TABLET    Take 1 tablet (10 mg total) by mouth daily.     SIGNIFICANT DIAGNOSTIC EXAMS  02-15-15: abdominal ultrasound: 1. Gallbladder not visualized. 2. No hydronephrosis no renal calculus; 3. The pancreas not well visualized; which could be related to gassy abdomen but an appearance which might represent pancreatitis in  the appropriate clinical setting.   02-26-15: right lower extremity doppler: - No evidence of deep vein thrombosis involving the right lower   extremity. - No evidence of Baker&'s cyst on the right.  03-12-15: upper endoscopy: duodenal bulb 1.5-2.0 cm ulcer with clot at base. No active bleeding. Surrounding mucosa was edematous. Biopsy of the surrounding mucosa were done.   12-20-15: ct of abdomen and pelvis: 1. Loosely twisted sigmoid volvulus with transition point. Proximal distention is improved compared to radiography earlier today suggesting partial interval decompression. 2. Chronic findings are stable and described above.  12-20-15: flex sigmoidoscopy: chronic intermittent sigmoid volvulus. The site of volvulus was only slightly twisted during this exam, easily passed with colonoscope and the air filled dilated colon proximal to it was suctioned extensively. Her abdomen was flat, normal after the exam.   02-19-16: right lower extremity doppler: neg dvt  02-23-16: renal ultrasound: normal right kidney slightly small left kidney. Right 1.9 cm cyst no hydronephrosis or stones   03-19-16: chest x-ray: modest cardiomegaly with mild pulmonary venous congestion    LABS REVIEWED:     04-30-15: H-pylori: neg  10-14-15: wbc 5.0; hgb 11.5; hct 33.9; mcv 101.2; plt 127; glucose 132; bun 15.8; creat 1.27; k+ 3.8; na++ 143; liver normal albumin 4.0  12-20-15: wbc 5.2; hgb 11.0; hct 32.4; mcv 101.3; plt 107; glucose 138; bun 31; creat 1.61; k+ 3.2; na++ 138; liver normal albumin 3.9; urine culture: e-coli: rocephin 12-21-15: wbc 3.9; hgb 9.8; hct 29.7; mcv 103.1;  plt 98; glucose 108; bun 23; creat 1.15; k+ 3.2; na++ 143; mag 2.4 12-22-15: wbc 5.1; hgb 10.8; hct 32.3; mcv 103.2; plt 101; glucose 125; bun 18; creat 1.09; k+ 3.9; na++ 142  01-04-16: wbc 4.1; hgb 10.9; hct 32.4; mcv 100.5; plt 175; glucose 121; bun 13.2; creat 1.18; k+ 3.8; na++ 144; liver normal albumin 3.8 01-31-16; urine culture: e-coli: ESBL  invanz 02-15-16: wbc 4.6; hgb 11.3; hct 36.3; mcv 102.7; plt 128; glucose 160; bun 15.3; creat 1.65; k+ 4.5; na++ 143; liver normal albumin 3.9 02-19-16: glucose 116; bun 28.7; mcv 1.72; k+ 4.5; na++ 143 02-23-16; wbc 3.5 hgb 9.7; hct 30.4; mcv 101.8; plt 126; glucose 118; bun 25.6; creat 1.52; k+ 4.6; na++ 144  03-11-16: glucose 196; bun 18.4; creat 1.28; k+ 3.7; na++ 141  03-19-16: A/B neg for influenza 04-01-16: glucose 139; bun 15.7; creat 1.16; k+ 3.1; na++ 144 04-07-16: glucose 140; bun 18.2; creat 1.37; k+ 4.1; na++ 143  04-17-16: wbc 5.0; hgb 11.1; hct 33.3; mcv 100.6; plt 119; glucose 178; bun 19; creat 1.22; k+ 3.7; na++ 141; liver normal albumin 3.8 04-18-16: wbc 4.0; hgb 9.3; hct 28.8; mcv 101.1; plt 93; glucose 124; bun 17; creat 1.20; k+ 2.9; na++ 141; liver normal albumin 3.2; mag 1.7 phos 3.7; tsh 2.572; hgb a1c 6.9 04-20-16: wbc 5.5; hgb 10.2; hct 30.9; mcv 100.0; plt 97; glucose 103; bun 14; create 1.00; k+ 3.7; na++ 143; mag 1.8  04-24-16:glucose 132; bun 17; creat 1.05; k+ 4.3; na++ 138     Review of Systems  Constitutional: Negative for appetite change and fatigue.  HENT: Negative for congestion.   Respiratory: Negative for cough, chest tightness and shortness of breath.   Cardiovascular: Negative for chest pain, palpitations and leg swelling.  Gastrointestinal: Negative for nausea, abdominal pain, diarrhea and constipation.  Musculoskeletal: no complaint of muscle or joint pain    Skin: Negative for pallor.  Neurological: Negative for dizziness.  Psychiatric/Behavioral: The patient is not nervous/anxious.       Physical Exam  Constitutional: No distress.  Eyes: Conjunctivae are normal.  Neck: Neck supple. No JVD present. No thyromegaly present.  Cardiovascular: Normal rate, regular rhythm and intact distal pulses.   Respiratory: Effort normal and breath sounds normal. No respiratory distress. She has no wheezes.  Is 02 dependent  GI: Soft. Bowel sounds hypoactive;  distended no tenderness  Musculoskeletal: She exhibits no edema.  Able to move all extremities .   Lymphadenopathy:    She has no cervical adenopathy.  Neurological: She is alert.  Skin: Skin is warm and dry. She is not diaphoretic.  Psychiatric: She has a normal mood and affect.     ASSESSMENT/ PLAN:  1. Hypertension: will continue norvasc 2.5 mg daily coreg 3.15 mg daily   2. Chronic diastolic heart failure: is presently not on diuretic  Will not make changes will monitor   3. Duodenal ulcer: will continue protonix 40 mg  daily  Has zofran 8 mg every 8 hours as needed   4. UTI: will continue cranberry daily      5. Anemia: will continue iron daily hgb is 10.2  6. Chronic afib: will continue coreg 3.125 mg daily for rate control. Will continue asa 81mg  daily .    7. Chronic respiratory failure: is 02 dependent. Has duoneb every 6 hours as needed   8. Allergic rhinitis: will continue flonase nightly and claritin 10 mg daily   9. Chronic pain due to osteoarthritis: will continue lidoderm to  lower back daily; will continue baclofen 5 mg nightly for leg spasticity has vicodin 5/325 mg 1 or 2 tabs every 4 hours as needed for pain   10. Depression: is stable receives benefit form lexapro 10 mg daily   11. Chronic constipation  Has recurrent sigmoid volvulus: will continue colace 200 mg daily linzess 145 mcg daily; senna twice daily miralax daily   Her family has decided upon conservative treatment including repeat decompression by endoscope if needed.   12. Glaucoma: will continue  trusopt to both eyes three times daily; travatan to both eyes nightly   13. CKD stage III: bun/creat 17/1.05   14. Hypokalemia: k+ 4.3; will continue k+ 40 meq daily    MD is aware of resident's narcotic use and is in agreement with current plan of care. We will attempt to wean resident as appropriate.      Ok Edwards NP [facility]  Contact [phone] Monday through Friday  8am- 5pm  After hours call [phone]

## 2016-04-26 ENCOUNTER — Telehealth: Payer: Self-pay

## 2016-04-26 LAB — CBC AND DIFFERENTIAL
HCT: 32 % — AB (ref 36–46)
HEMATOCRIT: 32 % — AB (ref 36–46)
HEMOGLOBIN: 10.3 g/dL — AB (ref 12.0–16.0)
Hemoglobin: 10.3 g/dL — AB (ref 12.0–16.0)
Platelets: 120 10*3/uL — AB (ref 150–399)
Platelets: 120 10*3/uL — AB (ref 150–399)
WBC: 4.5 10^3/mL
WBC: 4.5 10^3/mL

## 2016-04-26 LAB — BASIC METABOLIC PANEL
BUN: 11 mg/dL (ref 4–21)
BUN: 11 mg/dL (ref 4–21)
CREATININE: 1 mg/dL (ref 0.5–1.1)
CREATININE: 1 mg/dL (ref 0.5–1.1)
GLUCOSE: 123 mg/dL
GLUCOSE: 123 mg/dL
POTASSIUM: 3.7 mmol/L (ref 3.4–5.3)
Potassium: 3.7 mmol/L (ref 3.4–5.3)
SODIUM: 143 mmol/L (ref 137–147)
SODIUM: 143 mmol/L (ref 137–147)

## 2016-04-26 NOTE — Telephone Encounter (Signed)
Possible re-admission to facility. This is a patient you were seeing at [facility] . [facility] F/U is needed if patient was re-admitted to facility upon discharge. Hospital discharge from [place] on 04/24/16.

## 2016-05-01 ENCOUNTER — Encounter: Payer: Self-pay | Admitting: Internal Medicine

## 2016-05-01 ENCOUNTER — Non-Acute Institutional Stay (SKILLED_NURSING_FACILITY): Payer: Medicare Other | Admitting: Internal Medicine

## 2016-05-01 DIAGNOSIS — M545 Low back pain, unspecified: Secondary | ICD-10-CM

## 2016-05-01 DIAGNOSIS — K219 Gastro-esophageal reflux disease without esophagitis: Secondary | ICD-10-CM

## 2016-05-01 DIAGNOSIS — K5901 Slow transit constipation: Secondary | ICD-10-CM

## 2016-05-01 DIAGNOSIS — F0390 Unspecified dementia without behavioral disturbance: Secondary | ICD-10-CM

## 2016-05-01 DIAGNOSIS — I482 Chronic atrial fibrillation, unspecified: Secondary | ICD-10-CM

## 2016-05-01 DIAGNOSIS — D638 Anemia in other chronic diseases classified elsewhere: Secondary | ICD-10-CM | POA: Diagnosis not present

## 2016-05-01 DIAGNOSIS — I5032 Chronic diastolic (congestive) heart failure: Secondary | ICD-10-CM

## 2016-05-01 DIAGNOSIS — M150 Primary generalized (osteo)arthritis: Secondary | ICD-10-CM | POA: Diagnosis not present

## 2016-05-01 DIAGNOSIS — K562 Volvulus: Secondary | ICD-10-CM | POA: Diagnosis not present

## 2016-05-01 DIAGNOSIS — E876 Hypokalemia: Secondary | ICD-10-CM

## 2016-05-01 DIAGNOSIS — E1142 Type 2 diabetes mellitus with diabetic polyneuropathy: Secondary | ICD-10-CM | POA: Diagnosis not present

## 2016-05-01 DIAGNOSIS — M159 Polyosteoarthritis, unspecified: Secondary | ICD-10-CM

## 2016-05-01 NOTE — Progress Notes (Signed)
Patient ID: Maria Christian, female   DOB: 24-Sep-1924, 82 y.o.   MRN: [id number]    HISTORY AND PHYSICAL   DATE: 05/01/2016  Location:    [facility] Room Number: 270 A Place of Service: SNF (31)   Extended Emergency Contact Information Primary Emergency Contact: [place] Phone: [phone] Relation: Son Secondary Emergency Contact: Roberts,Lynn Address: [address]          [place], [place] [address] [place] Phone: [phone] Mobile Phone: [phone] Relation: Daughter  Advanced Directive information Does Patient Have a Medical Advance Directive?: Yes, Type of Advance Directive: Out of facility DNR (pink MOST or yellow form), Pre-existing out of facility DNR order (yellow form or pink MOST form): Yellow form placed in chart (order not valid for inpatient use);Pink MOST form placed in chart (order not valid for inpatient use)  Chief Complaint  Patient presents with  . Readmit To SNF    HPI:  82 yo female seen today as a readmission into SNF following hospital stay for sigmoid volvulus. She underwent flex sig x 2 and was successfully decompressed with 2nd attempt. She was told that she may have colostomy if she were to have surgery so she opted for conservative tx. She is aware that she may have a recurrence of volvulus and states she will simply need to repeat flex sig. K 2.9--> 4.3; Cr 1.22-->1.05; A1c 6.9%; albumin 3.2; Mg 1.6-->2 at d/c. She presents to SNF for long term care.   Today she reports c/a therapy and states that it disrupts her day. She does not feel she is benefiting in any way and would like for it to stop. No other concerns. No N/V or abdominal pain. Daughter, Arbie Cookey, is c/a increased anxiety but pt reports she feels well overall. She does take lexapro. Sleeps well. Appetite is okay. No nursing issues. She is a poor historian due to dementia. Hx obtained from chart.  Hypertension - stable on  norvasc 2.5 mg daily; coreg 3.15 mg daily   Chronic diastolic heart failure - stable off diuretic   Hx Duodenal ulcer/GERD - stable on protonix 40 mg daily; Has zofran 8 mg every 8 hours as needed   Recurrent UTI - stable on cranberry tabs daily      Hx Anemia of chronic disease - stable on iron daily. Hgb 9.7  PAF - rate controlled on coreg 3.125 mg daily. Takes  ASA 19m daily for anticoagulation  Chronic respiratory failure - O2 dependent but uses prn.  Has duoneb every 6 hours as needed   Allergic rhinitis - stable on flonase nightly and claritin 10 mg daily   Chronic pain due to osteoarthritis - pain controlled on lidoderm to lower back daily; baclofen 5 mg nightly for leg spasticity; vicodin 5/325 mg 1 or 2 tabs every 4 hours as needed for pain. She has been unable to stand on her own for several yrs. She is w/c bound. Narcotic use benefit outweighs risk at this time.   Depression/anxiety - stable on lexapro 10 mg daily   Chronic constipation - she has hx recurrent sigmoid volvulus; takes colace 200 mg daily; linzess 145 mcg daily; senna twice daily; miralax daily.  Her family has decided upon conservative treatment including repeat decompression by endoscope if needed.   Glaucoma - pressures stable on  trusopt to both eyes three times daily; travatan to both eyes nightly. Followed by eye specialist  CKD stage III - stable.  Cr 1.05  Hypokalemia - stable on k+ 40 meq daily. K 4.3  Dementia - stable without medication. No behavioral disturbance. She does not wander  DM - diet controlled. A1c 6.9%   Past Medical History:  Diagnosis Date  . Altered mental status   . Anemia   . Atrial fibrillation ([place])   . Constipation 02/27/2015  . Coronary artery disease   . DEMENTIA   . Depression   . Diabetes mellitus   . Edema of lower extremity 07/13/11   right leg more swollen than left leg  . Encephalopathy   . Enlarged heart   . Esophageal dysmotility 07/02/12  . GERD  (gastroesophageal reflux disease)   . History of adenomatous polyp of colon 06/24/99  . Horseshoe kidney   . Hyperlipemia   . Hypertension   . Pancreatic lesion 05/22/11   no further workup  per PCP/family due to age  . Parkinson disease ([place])   . Peripheral neuropathy ([place])   . Sigmoid volvulus ([place]) 05/24/2013  . Thrombophlebitis   . TIA (transient ischemic attack) 12/19/2012    Past Surgical History:  Procedure Laterality Date  . ABDOMINAL HYSTERECTOMY    . BOWEL DECOMPRESSION N/A 04/18/2016   Procedure: BOWEL DECOMPRESSION;  Surgeon: [name], MD;  Location: [facility];  Service: Endoscopy;  Laterality: N/A;  . BREAST SURGERY     2 benign tumors removed left breast  . CHOLECYSTECTOMY    . ESOPHAGEAL DILATION     several times by Dr. [name]  . ESOPHAGOGASTRODUODENOSCOPY (EGD) WITH ESOPHAGEAL DILATION N/A 08/01/2012   Procedure: ESOPHAGOGASTRODUODENOSCOPY (EGD) WITH ESOPHAGEAL DILATION;  Surgeon: [name], MD;  Location: [facility];  Service: Endoscopy;  Laterality: N/A;  with c-arm savory dilators  . ESOPHAGOGASTRODUODENOSCOPY (EGD) WITH PROPOFOL N/A 03/12/2015   Procedure: ESOPHAGOGASTRODUODENOSCOPY (EGD) WITH PROPOFOL;  Surgeon: [name], MD;  Location: [facility];  Service: Endoscopy;  Laterality: N/A;  . FLEXIBLE SIGMOIDOSCOPY N/A 05/24/2013   Procedure: FLEXIBLE SIGMOIDOSCOPY;  Surgeon: [name], MD;  Location: [facility];  Service: Endoscopy;  Laterality: N/A;  . FLEXIBLE SIGMOIDOSCOPY N/A 05/26/2013   Procedure:  flex with decompression of sigmoid volvulus;  Surgeon: [name], MD;  Location: [facility];  Service: Endoscopy;  Laterality: N/A;  . FLEXIBLE SIGMOIDOSCOPY N/A 12/20/2015   Procedure: FLEXIBLE SIGMOIDOSCOPY;  Surgeon: Milus Banister, MD;  Location: [facility];  Service: Endoscopy;  Laterality: N/A;  . FLEXIBLE SIGMOIDOSCOPY N/A 04/17/2016   Procedure: FLEXIBLE SIGMOIDOSCOPY;  Surgeon: Ladene Artist, MD;  [facility]: [facility];  Service: Endoscopy;  Laterality: N/A;  . FLEXIBLE SIGMOIDOSCOPY N/A 04/18/2016   Procedure: FLEXIBLE SIGMOIDOSCOPY;  Surgeon: [name], MD;  [facility]: [facility];  Service: Endoscopy;  Laterality: N/A;  . FOOT SURGERY     benign tumors from foot  . NOSE SURGERY    . SHOULDER SURGERY  2001   left clavicle excision and acromioplasty  . TOTAL HIP ARTHROPLASTY      Patient Care Team: [name], DO as PCP - General (Internal Medicine) Gerlene Fee, NP as Nurse Practitioner (Nurse Practitioner)  Social History   Social History  . Marital status: Widowed    Spouse name: N/A  . Number of children: 5  . Years of education: 10th   Occupational History  . Retired    Social History Main Topics  . Smoking status: Former Smoker    Quit date: 10/09/1964  . Smokeless tobacco: Never Used  . Alcohol use No  .  Drug use: No  . Sexual activity: No   Other Topics Concern  . Not on file   Social History Narrative   Pt lives at [facility] [place].   Caffeine Use: very small amount daily     reports that she quit smoking about 51 years ago. She has never used smokeless tobacco. She reports that she does not drink alcohol or use drugs.  Family History  Problem Relation Age of Onset  . Cancer    . Heart disease     Family Status  Relation Status  . Mother Deceased at age 17   blood clots  . Father Deceased at age 63   cirrohis of the liver  .      Immunization History  Administered Date(s) Administered  . Influenza-Unspecified 07/13/2015, 03/09/2016  . PPD Test 07/28/2013, 02/18/2016, 02/25/2016    Allergies  Allergen Reactions  . Aspirin Other (See Comments)    G.I. Upset only    Medications: Patient's Medications  New Prescriptions   No medications on file  Previous Medications   AMLODIPINE (NORVASC) 2.5 MG TABLET    Take 2.5 mg by mouth every morning. Hold for BP < 100/60 or HR < 60   ASPIRIN EC 81 MG TABLET    Take 81 mg by mouth  daily.   BACLOFEN (LIORESAL) 10 MG TABLET    Take 5 mg by mouth at bedtime.   BENZOCAINE-MENTHOL (CHLORASEPTIC) 6-10 MG LOZENGE    Take 1 lozenge by mouth as needed for sore throat.   BIOTIN 5 MG/ML LIQD    Take 5 mLs by mouth 2 (two) times daily.    CARVEDILOL (COREG) 3.125 MG TABLET    Take 3.125 mg by mouth daily.   CHOLECALCIFEROL (VITAMIN D) 1000 UNITS TABLET    Take 1,000 Units by mouth every morning.    CRANBERRY 475 MG CAPS    Take 1 capsule by mouth 2 (two) times daily.    DOCUSATE SODIUM (COLACE) 100 MG CAPSULE    Take 200 mg by mouth at bedtime.    DORZOLAMIDE (TRUSOPT) 2 % OPHTHALMIC SOLUTION    Place 1 drop into both eyes 3 (three) times daily. Reported on 11/16/2015        0800,1400,2000   ESCITALOPRAM (LEXAPRO) 10 MG TABLET    Take 10 mg by mouth daily.   FEEDING SUPPLEMENT, ENSURE ENLIVE, (ENSURE ENLIVE) LIQD    Take 237 mLs by mouth 2 (two) times daily between meals.   FERROUS SULFATE 325 (65 FE) MG TABLET    Take 325 mg by mouth daily with breakfast.   FLUTICASONE (FLONASE) 50 MCG/ACT NASAL SPRAY    Place 2 sprays into both nostrils at bedtime.   HYDROCODONE-ACETAMINOPHEN (NORCO/VICODIN) 5-325 MG TABLET    Take 1-2 tablets by mouth every 4 (four) hours as needed for moderate pain.   IPRATROPIUM-ALBUTEROL (DUONEB) 0.5-2.5 (3) MG/3ML SOLN    Take 3 mLs by nebulization daily as needed (wheezing).   LIDOCAINE (LIDODERM) 5 %    Place 1 patch onto the skin daily. Apply 1 patch to left lower back/hip every morning.  Remove & Discard patch within 12 hours or as directed by MD   LINACLOTIDE (LINZESS) 145 MCG CAPS CAPSULE    Take 145 mcg by mouth daily before breakfast.    LORATADINE (CLARITIN) 10 MG TABLET    Take 10 mg by mouth daily before breakfast.    MULTIPLE VITAMINS-MINERALS (CERTAGEN PO)    Take 1 tablet by  mouth daily.    ONDANSETRON (ZOFRAN) 8 MG TABLET    Take 8 mg by mouth every 8 (eight) hours as needed for nausea or vomiting.   OXYGEN    Inhale 2 L into the lungs continuous.    POLYETHYLENE GLYCOL (MIRALAX / GLYCOLAX) PACKET    Take 17 g by mouth daily at 6 (six) AM.    POTASSIUM CHLORIDE SA (K-DUR,KLOR-CON) 20 MEQ TABLET    Take 2 tablets (40 mEq total) by mouth daily.   PROTONIX 40 MG PACK    Take 40 mg by mouth daily at 6 (six) AM.   SENNA (SENOKOT) 8.6 MG TABS TABLET    Take 1 tablet (8.6 mg total) by mouth 2 (two) times daily.   SKIN PROTECTANTS, MISC. (CALAZIME SKIN PROTECTANT EX)    Apply 1 application topically daily. Buttocks protection   SODIUM CHLORIDE (OCEAN) 0.65 % SOLN NASAL SPRAY    Place 1 spray into both nostrils as directed. Four times daily And every 24 hours as needed to moisturize nasal passages.   TORSEMIDE (DEMADEX) 20 MG TABLET    Take 20 mg by mouth daily.   TRAVOPROST, BAK FREE, (TRAVATAN) 0.004 % SOLN OPHTHALMIC SOLUTION    Place 1 drop into both eyes daily at 8 pm.    VITAMIN B-12 (CYANOCOBALAMIN) 500 MCG TABLET    Take 500 mcg by mouth every morning.   Modified Medications   No medications on file  Discontinued Medications   No medications on file    Review of Systems  Unable to perform ROS: Dementia    Vitals:   05/01/16 1213  BP: (!) 148/78  Pulse: 72  Resp: 16  Temp: (!) 93.2 F (34 C)  TempSrc: Oral  SpO2: 97%  Weight: 152 lb 3.2 oz (69 kg)  Height: 5' 7"  (1.702 m)   Body mass index is 23.84 kg/m.  Physical Exam  Constitutional: She appears well-developed.  Frail appearing, sitting in w/c in NAD  HENT:  Mouth/Throat: Oropharynx is clear and moist. No oropharyngeal exudate.  MMM  Eyes: Pupils are equal, round, and reactive to light. No scleral icterus.  Neck: Neck supple. Carotid bruit is not present. No tracheal deviation present.  Cardiovascular: Normal rate, regular rhythm and intact distal pulses.   Occasional extrasystoles are present. Exam reveals no gallop and no friction rub.   Murmur (1/6 SEM) heard. Trace RLE edema. No LLE edema. No calf TTP  Pulmonary/Chest: Effort normal. No stridor. No respiratory  distress. She has wheezes (inspiratory). She has no rales.  Abdominal: Soft. Bowel sounds are normal. She exhibits distension. She exhibits no mass. There is no hepatomegaly. There is no tenderness. There is no rebound and no guarding.  (+) tympany to percussion  Musculoskeletal: She exhibits edema (right knee ) and tenderness (right popliteal fossa).  Lymphadenopathy:    She has no cervical adenopathy.  Neurological: She is alert.  Skin: Skin is warm and dry. No rash noted.  Psychiatric: She has a normal mood and affect. Her behavior is normal.     Labs reviewed: Admission on 04/17/2016, Discharged on 04/24/2016  No results displayed because visit has over 200 results.    Nursing Home on 04/17/2016  Component Date Value Ref Range Status  . Glucose 04/07/2016 140  mg/dL Final  . BUN 04/07/2016 18  4 - 21 mg/dL Final  . Creatinine 04/07/2016 1.4* 0.5 - 1.1 mg/dL Final  . Potassium 04/07/2016 4.1  3.4 - 5.3 mmol/L Final  . Sodium  04/07/2016 143  137 - 147 mmol/L Final  Nursing Home on 03/21/2016  Component Date Value Ref Range Status  . Glucose 03/11/2016 196  mg/dL Final  . BUN 03/11/2016 18  4 - 21 mg/dL Final  . Creatinine 03/11/2016 1.3* 0.5 - 1.1 mg/dL Final  . Potassium 03/11/2016 3.7  3.4 - 5.3 mmol/L Final  . Sodium 03/11/2016 141  137 - 147 mmol/L Final  Admission on 03/05/2016, Discharged on 03/06/2016  Component Date Value Ref Range Status  . Sodium 03/06/2016 143  135 - 145 mmol/L Final  . Potassium 03/06/2016 4.2  3.5 - 5.1 mmol/L Final  . Chloride 03/06/2016 115* 101 - 111 mmol/L Final  . CO2 03/06/2016 22  22 - 32 mmol/L Final  . Glucose, Bld 03/06/2016 120* 65 - 99 mg/dL Final  . BUN 03/06/2016 23* 6 - 20 mg/dL Final  . Creatinine, Ser 03/06/2016 1.40* 0.44 - 1.00 mg/dL Final  . Calcium 03/06/2016 8.6* 8.9 - 10.3 mg/dL Final  . Total Protein 03/06/2016 6.6  6.5 - 8.1 g/dL Final  . Albumin 03/06/2016 3.3* 3.5 - 5.0 g/dL Final  . AST 03/06/2016 29  15 - 41 U/L  Final  . ALT 03/06/2016 15  14 - 54 U/L Final  . Alkaline Phosphatase 03/06/2016 82  38 - 126 U/L Final  . Total Bilirubin 03/06/2016 0.8  0.3 - 1.2 mg/dL Final  . GFR calc non Af Amer 03/06/2016 32* >60 mL/min Final  . GFR calc Af Amer 03/06/2016 37* >60 mL/min Final   Comment: (NOTE) The eGFR has been calculated using the CKD EPI equation. This calculation has not been validated in all clinical situations. eGFR's persistently <60 mL/min signify possible Chronic Kidney Disease.   . Anion gap 03/06/2016 6  5 - 15 Final  . WBC 03/06/2016 3.8* 4.0 - 10.5 K/uL Final  . RBC 03/06/2016 2.94* 3.87 - 5.11 MIL/uL Final  . Hemoglobin 03/06/2016 9.8* 12.0 - 15.0 g/dL Final  . HCT 03/06/2016 29.5* 36.0 - 46.0 % Final  . MCV 03/06/2016 100.3* 78.0 - 100.0 fL Final  . MCH 03/06/2016 33.3  26.0 - 34.0 pg Final  . MCHC 03/06/2016 33.2  30.0 - 36.0 g/dL Final  . RDW 03/06/2016 14.5  11.5 - 15.5 % Final  . Platelets 03/06/2016 106* 150 - 400 K/uL Final   Comment: SPECIMEN CHECKED FOR CLOTS REPEATED TO VERIFY PLATELET COUNT CONFIRMED BY SMEAR   . Neutrophils Relative % 03/06/2016 45  % Final  . Neutro Abs 03/06/2016 1.7  1.7 - 7.7 K/uL Final  . Lymphocytes Relative 03/06/2016 40  % Final  . Lymphs Abs 03/06/2016 1.5  0.7 - 4.0 K/uL Final  . Monocytes Relative 03/06/2016 10  % Final  . Monocytes Absolute 03/06/2016 0.4  0.1 - 1.0 K/uL Final  . Eosinophils Relative 03/06/2016 5  % Final  . Eosinophils Absolute 03/06/2016 0.2  0.0 - 0.7 K/uL Final  . Basophils Relative 03/06/2016 0  % Final  . Basophils Absolute 03/06/2016 0.0  0.0 - 0.1 K/uL Final  . Lactic Acid, Venous 03/06/2016 1.01  0.5 - 1.9 mmol/L Final  . Troponin i, poc 03/06/2016 0.03  0.00 - 0.08 ng/mL Final  . Comment 3 03/06/2016          Final   Comment: Due to the release kinetics of cTnI, a negative result within the first hours of the onset of symptoms does not rule out myocardial infarction with certainty. If myocardial  infarction is still suspected,  repeat the test at appropriate intervals.   . Color, Urine 03/06/2016 YELLOW  YELLOW Final  . APPearance 03/06/2016 TURBID* CLEAR Final  . Specific Gravity, Urine 03/06/2016 1.013  1.005 - 1.030 Final  . pH 03/06/2016 5.5  5.0 - 8.0 Final  . Glucose, UA 03/06/2016 NEGATIVE  NEGATIVE mg/dL Final  . Hgb urine dipstick 03/06/2016 MODERATE* NEGATIVE Final  . Bilirubin Urine 03/06/2016 NEGATIVE  NEGATIVE Final  . Ketones, ur 03/06/2016 NEGATIVE  NEGATIVE mg/dL Final  . Protein, ur 03/06/2016 30* NEGATIVE mg/dL Final  . Nitrite 03/06/2016 NEGATIVE  NEGATIVE Final  . Leukocytes, UA 03/06/2016 LARGE* NEGATIVE Final  . Specimen Description 03/07/2016 URINE, CATHETERIZED   Final  . Special Requests 03/07/2016 NONE   Final  . Culture 03/07/2016 80,000 COLONIES/mL YEAST*  Final  . Report Status 03/07/2016 03/07/2016 FINAL   Final  . Squamous Epithelial / LPF 03/06/2016 0-5* NONE SEEN Final  . WBC, UA 03/06/2016 TOO NUMEROUS TO COUNT  0 - 5 WBC/hpf Final  . RBC / HPF 03/06/2016 0-5  0 - 5 RBC/hpf Final  . Bacteria, UA 03/06/2016 MANY* NONE SEEN Final  Nursing Home on 03/01/2016  Component Date Value Ref Range Status  . Hemoglobin 02/23/2016 9.7* 12.0 - 16.0 g/dL Final  . HCT 02/23/2016 31* 36 - 46 % Final  . Platelets 02/23/2016 126* 150 - 399 K/L Final  . WBC 02/23/2016 3.5  10^3/mL Final  . Glucose 02/23/2016 118  mg/dL Final  . BUN 02/23/2016 26* 4 - 21 mg/dL Final  . Creatinine 02/23/2016 1.5* 0.5 - 1.1 mg/dL Final  . Potassium 02/23/2016 4.6  3.4 - 5.3 mmol/L Final  . Sodium 02/23/2016 144  137 - 147 mmol/L Final  Nursing Home on 02/22/2016  Component Date Value Ref Range Status  . Hemoglobin 02/15/2016 11.3* 12.0 - 16.0 g/dL Final  . HCT 02/15/2016 36  36 - 46 % Final  . Platelets 02/15/2016 128* 150 - 399 K/L Final  . WBC 02/15/2016 4.6  10^3/mL Final  . Glucose 02/15/2016 160  mg/dL Final  . BUN 02/15/2016 19  4 - 21 mg/dL Final  . Creatinine  02/15/2016 1.5* 0.5 - 1.1 mg/dL Final  . Potassium 02/15/2016 4.5  3.4 - 5.3 mmol/L Final  . Sodium 02/15/2016 143  137 - 147 mmol/L Final  . Alkaline Phosphatase 02/15/2016 107  25 - 125 U/L Final  . ALT 02/15/2016 11  7 - 35 U/L Final  . AST 02/15/2016 20  13 - 35 U/L Final  . Bilirubin, Total 02/15/2016 0.4  mg/dL Final    Ct Abdomen Pelvis Wo Contrast  Result Date: 04/17/2016 CLINICAL DATA:  Abdominal pain for 3 days, abdominal distention, absent bowel sounds. Bowel movement yesterday. History of small bowel obstruction and volvulus. EXAM: CT ABDOMEN AND PELVIS WITHOUT CONTRAST TECHNIQUE: Multidetector CT imaging of the abdomen and pelvis was performed following the standard protocol without IV contrast. COMPARISON:  CT abdomen and pelvis March 06, 2016 FINDINGS: LOWER CHEST: Similar bronchial wall thickening. The heart is enlarged with small pericardial effusion. Severe coronary artery and mitral annular calcifications. HEPATOBILIARY: The liver is normal, status post cholecystectomy. PANCREAS: Atrophic pancreas, otherwise unremarkable. SPLEEN: Normal. ADRENALS/URINARY TRACT: Horseshoe kidney. 2 mm LEFT lower pole nephrolithiasis without hydronephrosis. Limited assessment for renal mass by noncontrast CT. Urinary bladder is well distended, unremarkable. Normal adrenal glands. STOMACH/BOWEL: Sigmoid volvulus with swirled mesentery, proximal sigmoid and gas distended, 8.8 cm in transaxial dimension. Rectal air-fluid level. Small air-filled duodenum diverticulum. VASCULAR/LYMPHATIC: Aortoiliac  vessels are normal in course and caliber, severe calcific atherosclerosis. No lymphadenopathy by CT size criteria. REPRODUCTIVE: Normal. OTHER: No intraperitoneal free fluid or free air. MUSCULOSKELETAL: Non-acute. Status post LEFT hip total arthroplasty. Osteopenia. Multilevel severe facet arthropathy. Anterior pelvic wall scarring. IMPRESSION: Sigmoid volvulus. 2 mm nonobstructing LEFT nephrolithiasis.   Horseshoe kidney. Severe atherosclerosis. Acute findings discussed with and reconfirmed by [facility] on 04/17/2016 at 5:28 pm. Electronically Signed   By: Elon Alas M.D.   On: 04/17/2016 17:28   Dg Chest 1 View  Result Date: 04/20/2016 CLINICAL DATA:  Cough.  History of atrial fibrillation. EXAM: CHEST 1 VIEW COMPARISON:  04/19/2016. FINDINGS: Stable enlarged cardiac silhouette. Left basilar scarring without significant change. Diffuse osteopenia. Aortic calcifications. IMPRESSION: 1. No acute abnormality. 2. Stable cardiomegaly and aortic atherosclerosis. Electronically Signed   By: Claudie Revering M.D.   On: 04/20/2016 17:26   Ct Cervical Spine Wo Contrast  Result Date: 04/23/2016 CLINICAL DATA:  Chronic neck pain. EXAM: CT CERVICAL SPINE WITHOUT CONTRAST TECHNIQUE: Multidetector CT imaging of the cervical spine was performed without intravenous contrast. Multiplanar CT image reconstructions were also generated. COMPARISON:  None. FINDINGS: Alignment: There is reversal of normal lordosis centered at C5-6. No other malalignment. Skull base and vertebrae: No fractures. Soft tissues and spinal canal: Atherosclerotic change in the carotid arteries bilaterally. No other soft tissue abnormalities. Disc levels: Multilevel degenerative changes are seen. There is calcified hypertrophy of the ligaments along the posterior aspect of the odontoid process. This narrows the canal at the C1 level to an AP diameter of 10.5 mm. Small posterior disc bulges are identified at several levels. A disc osteophyte complex at C4-5 narrows the cervical canal at 8 mm. Moderate anterior osteophytes are seen at multiple levels. Facet degenerative changes are seen, particularly in the upper cervical spine to the right. Severe neural foraminal narrowing is seen at on the right at C3-4 and C4-5 with more mild narrowing on the right at C5-6. Mild left neural foraminal narrowing is seen at C4-5. Upper chest: Bilateral pleural  effusions identified. No other abnormalities. Other: No other abnormalities. IMPRESSION: 1. No fracture or malalignment. 2. Multilevel degenerative changes as described above with canal narrowing most prominent at C4-5 measuring 8 mm in AP dimension. Neural foraminal narrowing, right greater than left, at multiple levels as described above. Electronically Signed   By: Dorise Bullion III M.D   On: 04/23/2016 11:55   Dg Abd 2 Views  Result Date: 04/18/2016 CLINICAL DATA:  Abdominal pain and distention for several weeks. Evidence for a sigmoid volvulus on recent CT. EXAM: ABDOMEN - 2 VIEW COMPARISON:  12/20/2015 and abdominal CT 04/17/2016 FINDINGS: Again noted are dilated gas-filled loops of sigmoid colon in the right abdomen. The configuration would be compatible with a sigmoid volvulus and not changed from the recent CT. There continues to be gas-filled transverse colon. There is no evidence for free air. Again noted is a left hip arthroplasty. Surgical clips in the right upper abdomen. IMPRESSION: Stable distension of the sigmoid colon and the gas pattern would be compatible with a sigmoid volvulus. No change from the recent CT exam. No evidence for free air. Electronically Signed   By: Markus Daft M.D.   On: 04/18/2016 10:15   Dg Abd Acute W/chest  Result Date: 04/19/2016 CLINICAL DATA:  Sigmoid volvulus EXAM: DG ABDOMEN ACUTE W/ 1V CHEST COMPARISON:  Abdominal CT from 2 days ago FINDINGS: Sigmoid volvulus pattern with improved gaseous distension of the sigmoid colon. Luminal  diameter is up to 9 cm today, previously 11 cm. No small bowel obstruction. No evidence of pneumoperitoneum. No concerning mass effect or calcification. Cardiomegaly. There is no edema, consolidation, effusion, or pneumothorax. IMPRESSION: Sigmoid volvulus with improving gaseous distension. No evidence of free air or pneumatosis. Electronically Signed   By: Monte Fantasia M.D.   On: 04/19/2016 12:43     Assessment/Plan    ICD-9-CM ICD-10-CM   1. Volvulus of sigmoid colon ([place]) 560.2 K56.2    s/p decompression x 2 by flex sig  2. Primary osteoarthritis involving multiple joints 715.09 M15.0   3. Chronic atrial fibrillation (HCC) 427.31 I48.2   4. Diastolic CHF, chronic (HCC) 428.32 I50.32    428.0    5. Low back pain potentially associated with radiculopathy 724.2 M54.5   6. Dementia without behavioral disturbance, unspecified dementia type 294.20 F03.90   7. Type 2 diabetes mellitus with diabetic polyneuropathy, without long-term current use of insulin (HCC) 250.60 E11.42    357.2    8. Anemia of chronic disease 285.29 D63.8   9. Hypokalemia 276.8 E87.6   10. Slow transit constipation 564.01 K59.01   11. Gastroesophageal reflux disease without esophagitis 530.81 K21.9     D/c all therapy per pt request. She does not feel she is benefiting and states she could be doing other things in her house  Cont current meds as ordered  F/u with specialists as indicated  Check CBGs as ordered  GOAL: long term care. She is DNR. Communicated with pt and nursing.  Will follow   Lannah Koike S. Perlie Gold  [facility] [address] [place], [place] [address] [phone] Cell (Monday-Friday 8 AM - 5 PM) [phone] After 5 PM and follow prompts

## 2016-05-04 DIAGNOSIS — F0391 Unspecified dementia with behavioral disturbance: Secondary | ICD-10-CM | POA: Diagnosis not present

## 2016-05-04 DIAGNOSIS — F411 Generalized anxiety disorder: Secondary | ICD-10-CM | POA: Diagnosis not present

## 2016-05-04 DIAGNOSIS — F329 Major depressive disorder, single episode, unspecified: Secondary | ICD-10-CM | POA: Diagnosis not present

## 2016-05-05 LAB — HM DIABETES EYE EXAM

## 2016-05-15 DIAGNOSIS — H401134 Primary open-angle glaucoma, bilateral, indeterminate stage: Secondary | ICD-10-CM | POA: Diagnosis not present

## 2016-05-15 DIAGNOSIS — E119 Type 2 diabetes mellitus without complications: Secondary | ICD-10-CM | POA: Diagnosis not present

## 2016-05-15 DIAGNOSIS — H01009 Unspecified blepharitis unspecified eye, unspecified eyelid: Secondary | ICD-10-CM | POA: Diagnosis not present

## 2016-05-15 DIAGNOSIS — H04123 Dry eye syndrome of bilateral lacrimal glands: Secondary | ICD-10-CM | POA: Diagnosis not present

## 2016-05-24 ENCOUNTER — Encounter: Payer: Self-pay | Admitting: Adult Health

## 2016-05-24 ENCOUNTER — Non-Acute Institutional Stay (SKILLED_NURSING_FACILITY): Payer: Medicare Other | Admitting: Adult Health

## 2016-05-24 DIAGNOSIS — K5901 Slow transit constipation: Secondary | ICD-10-CM | POA: Diagnosis not present

## 2016-05-24 DIAGNOSIS — M150 Primary generalized (osteo)arthritis: Secondary | ICD-10-CM | POA: Diagnosis not present

## 2016-05-24 DIAGNOSIS — I5032 Chronic diastolic (congestive) heart failure: Secondary | ICD-10-CM | POA: Diagnosis not present

## 2016-05-24 DIAGNOSIS — J3089 Other allergic rhinitis: Secondary | ICD-10-CM | POA: Diagnosis not present

## 2016-05-24 DIAGNOSIS — I110 Hypertensive heart disease with heart failure: Secondary | ICD-10-CM | POA: Diagnosis not present

## 2016-05-24 DIAGNOSIS — N183 Chronic kidney disease, stage 3 unspecified: Secondary | ICD-10-CM

## 2016-05-24 DIAGNOSIS — D508 Other iron deficiency anemias: Secondary | ICD-10-CM

## 2016-05-24 DIAGNOSIS — J9611 Chronic respiratory failure with hypoxia: Secondary | ICD-10-CM | POA: Diagnosis not present

## 2016-05-24 DIAGNOSIS — K219 Gastro-esophageal reflux disease without esophagitis: Secondary | ICD-10-CM

## 2016-05-24 DIAGNOSIS — I482 Chronic atrial fibrillation, unspecified: Secondary | ICD-10-CM

## 2016-05-24 DIAGNOSIS — F0280 Dementia in other diseases classified elsewhere without behavioral disturbance: Secondary | ICD-10-CM | POA: Diagnosis not present

## 2016-05-24 DIAGNOSIS — G8929 Other chronic pain: Secondary | ICD-10-CM

## 2016-05-24 DIAGNOSIS — M159 Polyosteoarthritis, unspecified: Secondary | ICD-10-CM

## 2016-05-24 NOTE — Progress Notes (Signed)
Patient ID: [name], female   DOB: Feb 26, 1925, 82 y.o.   MRN: [id number]   Location:   [place] Room Number: 220-A Place of Service:  SNF (31)   CODE STATUS: DNR  Allergies  Allergen Reactions  . Aspirin Other (See Comments)    [name]    Chief Complaint  Patient presents with  . Medical Management of Chronic Issues    Follow up    HPI:  She is a long term resident of this facility being seen for the management of her chronic illnesses. Overall her status is stable. She is getting out of bed daily and does attend activities. She tells me that she is feeling good and has no complaints. There are no nursing concerns at this time.   Past Medical History:  Diagnosis Date  . Altered mental status   . Anemia   . Atrial fibrillation ([place])   . Constipation 02/27/2015  . Coronary artery disease   . DEMENTIA   . Depression   . Diabetes mellitus   . Edema of lower extremity 07/13/11   right leg more swollen than left leg  . Encephalopathy   . Enlarged heart   . Esophageal dysmotility 07/02/12  . GERD (gastroesophageal reflux disease)   . History of adenomatous polyp of colon 06/24/99  . Horseshoe kidney   . Hyperlipemia   . Hypertension   . Pancreatic lesion 05/22/11   no further workup  per PCP/family due to age  . Parkinson disease ([place])   . Peripheral neuropathy ([place])   . Sigmoid volvulus ([place]) 05/24/2013  . Thrombophlebitis   . TIA (transient ischemic attack) 12/19/2012    Past Surgical History:  Procedure Laterality Date  . ABDOMINAL HYSTERECTOMY    . BOWEL DECOMPRESSION N/A 04/18/2016   Procedure: BOWEL DECOMPRESSION;  Surgeon: [name], MD;  Location: [facility];  Service: Endoscopy;  Laterality: N/A;  . BREAST SURGERY     2 benign tumors removed left breast  . CHOLECYSTECTOMY    . ESOPHAGEAL DILATION     several times by Dr. [name]  . ESOPHAGOGASTRODUODENOSCOPY (EGD) WITH ESOPHAGEAL DILATION N/A 08/01/2012   Procedure:  ESOPHAGOGASTRODUODENOSCOPY (EGD) WITH ESOPHAGEAL DILATION;  Surgeon: [name], MD;  Location: [facility];  Service: Endoscopy;  Laterality: N/A;  with c-arm savory dilators  . ESOPHAGOGASTRODUODENOSCOPY (EGD) WITH PROPOFOL N/A 03/12/2015   Procedure: ESOPHAGOGASTRODUODENOSCOPY (EGD) WITH PROPOFOL;  Surgeon: [name], MD;  Location: [facility];  Service: Endoscopy;  Laterality: N/A;  . FLEXIBLE SIGMOIDOSCOPY N/A 05/24/2013   Procedure: FLEXIBLE SIGMOIDOSCOPY;  Surgeon: [name], MD;  Location: [facility];  Service: Endoscopy;  Laterality: N/A;  . FLEXIBLE SIGMOIDOSCOPY N/A 05/26/2013   Procedure:  flex with decompression of sigmoid volvulus;  Surgeon: [name], MD;  Location: [facility];  Service: Endoscopy;  Laterality: N/A;  . FLEXIBLE SIGMOIDOSCOPY N/A 12/20/2015   Procedure: FLEXIBLE SIGMOIDOSCOPY;  Surgeon: Milus Banister, MD;  Location: [facility];  Service: Endoscopy;  Laterality: N/A;  . FLEXIBLE SIGMOIDOSCOPY N/A 04/17/2016   Procedure: FLEXIBLE SIGMOIDOSCOPY;  Surgeon: Ladene Artist, MD;  Location: [facility];  Service: Endoscopy;  Laterality: N/A;  . FLEXIBLE SIGMOIDOSCOPY N/A 04/18/2016   Procedure: FLEXIBLE SIGMOIDOSCOPY;  Surgeon: [name], MD;  Location: [facility];  Service: Endoscopy;  Laterality: N/A;  . FOOT SURGERY     benign tumors from foot  . NOSE SURGERY    . SHOULDER SURGERY  2001   left clavicle  excision and acromioplasty  . TOTAL HIP ARTHROPLASTY      Social History   Social History  . Marital status: Widowed    Spouse name: N/A  . Number of children: 5  . Years of education: 10th   Occupational History  . Retired    Social History Main Topics  . Smoking status: Former Smoker    Quit date: 10/09/1964  . Smokeless tobacco: Never Used  . Alcohol use No  . Drug use: No  . Sexual activity: No   Other Topics Concern  . Not on file   Social History Narrative   Pt lives at [facility] [place].    Caffeine Use: very small amount daily   Family History  Problem Relation Age of Onset  . Cancer    . Heart disease        VITAL SIGNS BP 126/83   Pulse 70   Temp 97 F (36.1 C) (Oral)   Resp 18   Ht 5\' 7"  (1.702 m)   Wt 149 lb (67.6 kg)   SpO2 98%   BMI 23.34 kg/m   Patient's Medications  New Prescriptions   No medications on file  Previous Medications   AMLODIPINE (NORVASC) 2.5 MG TABLET    Take 2.5 mg by mouth every morning. Hold for BP < 100/60 or HR < 60   ASPIRIN EC 81 MG TABLET    Take 81 mg by mouth daily.   BACLOFEN (LIORESAL) 10 MG TABLET    Take 5 mg by mouth at bedtime.   BENZOCAINE-MENTHOL (CHLORASEPTIC) 6-10 MG LOZENGE    Take 1 lozenge by mouth as needed for sore throat.   BIOTIN 5 MG/ML LIQD    Take 5 mLs by mouth 2 (two) times daily.    CARVEDILOL (COREG) 3.125 MG TABLET    Take 3.125 mg by mouth daily.   CHOLECALCIFEROL (VITAMIN D) 1000 UNITS TABLET    Take 1,000 Units by mouth every morning.    CRANBERRY 475 MG CAPS    Take 1 capsule by mouth 2 (two) times daily.    DOCUSATE SODIUM (COLACE) 100 MG CAPSULE    Take 200 mg by mouth at bedtime.    DORZOLAMIDE (TRUSOPT) 2 % OPHTHALMIC SOLUTION    Place 1 drop into both eyes 3 (three) times daily. Reported on 11/16/2015        0800,1400,2000   ESCITALOPRAM (LEXAPRO) 10 MG TABLET    Take 10 mg by mouth daily.   FEEDING SUPPLEMENT, ENSURE ENLIVE, (ENSURE ENLIVE) LIQD    Take 237 mLs by mouth 2 (two) times daily between meals.   FERROUS SULFATE 325 (65 FE) MG TABLET    Take 325 mg by mouth daily with breakfast.   FLUTICASONE (FLONASE) 50 MCG/ACT NASAL SPRAY    Place 2 sprays into both nostrils at bedtime.   HYDROCODONE-ACETAMINOPHEN (NORCO/VICODIN) 5-325 MG TABLET    Take 1-2 tablets by mouth every 4 (four) hours as needed for moderate pain.   LIDOCAINE (LIDODERM) 5 %    Place 1 patch onto the skin daily. Apply 1 patch to left lower back/hip every morning.  Remove & Discard patch within 12 hours or as directed by MD     LINACLOTIDE (LINZESS) 145 MCG CAPS CAPSULE    Take 145 mcg by mouth daily before breakfast.    LORATADINE (CLARITIN) 10 MG TABLET    Take 10 mg by mouth daily before breakfast.    MULTIPLE VITAMINS-MINERALS (CERTAGEN PO)  Take 1 tablet by mouth daily.    ONDANSETRON (ZOFRAN) 8 MG TABLET    Take 8 mg by mouth every 8 (eight) hours as needed for nausea or vomiting.   OXYGEN    Inhale 2 L into the lungs continuous.   POLYETHYLENE GLYCOL (MIRALAX / GLYCOLAX) PACKET    Take 17 g by mouth daily at 6 (six) AM.    PROTONIX 40 MG PACK    Take 40 mg by mouth daily at 6 (six) AM.   SENNA (SENOKOT) 8.6 MG TABS TABLET    Take 1 tablet (8.6 mg total) by mouth 2 (two) times daily.   SKIN PROTECTANTS, MISC. (CALAZIME SKIN PROTECTANT EX)    Apply 1 application topically daily. Buttocks protection   SODIUM CHLORIDE (OCEAN) 0.65 % SOLN NASAL SPRAY    Place 1 spray into both nostrils as directed. Four times daily And every 24 hours as needed to moisturize nasal passages.   TORSEMIDE (DEMADEX) 20 MG TABLET    Take 20 mg by mouth daily.   TRAVOPROST, BAK FREE, (TRAVATAN) 0.004 % SOLN OPHTHALMIC SOLUTION    Place 1 drop into both eyes daily at 8 pm.    VITAMIN B-12 (CYANOCOBALAMIN) 500 MCG TABLET    Take 500 mcg by mouth every morning.   Modified Medications   No medications on file  Discontinued Medications   IPRATROPIUM-ALBUTEROL (DUONEB) 0.5-2.5 (3) MG/3ML SOLN    Take 3 mLs by nebulization daily as needed (wheezing).   POTASSIUM CHLORIDE SA (K-DUR,KLOR-CON) 20 MEQ TABLET    Take 2 tablets (40 mEq total) by mouth daily.     SIGNIFICANT DIAGNOSTIC EXAMS  02-15-15: abdominal ultrasound: 1. Gallbladder not visualized. 2. No hydronephrosis no renal calculus; 3. The pancreas not well visualized; which could be related to gassy abdomen but an appearance which might represent pancreatitis in the appropriate clinical setting.   03-12-15: upper endoscopy: duodenal bulb 1.5-2.0 cm ulcer with clot at base. No active  bleeding. Surrounding mucosa was edematous. Biopsy of the surrounding mucosa were done.   12-20-15: ct of abdomen and pelvis: 1. Loosely twisted sigmoid volvulus with transition point. Proximal distention is improved compared to radiography earlier today suggesting partial interval decompression. 2. Chronic findings are stable and described above.  12-20-15: flex sigmoidoscopy: chronic intermittent sigmoid volvulus. The site of volvulus was only slightly twisted during this exam, easily passed with colonoscope and the air filled dilated colon proximal to it was suctioned extensively. Her abdomen was flat, normal after the exam.   02-19-16: right lower extremity doppler: neg dvt  02-23-16: renal ultrasound: normal right kidney slightly small left kidney. Right 1.9 cm cyst no hydronephrosis or stones   03-19-16: chest x-ray: modest cardiomegaly with mild pulmonary venous congestion    LABS REVIEWED:     10-14-15: wbc 5.0; hgb 11.5; hct 33.9; mcv 101.2; plt 127; glucose 132; bun 15.8; creat 1.27; k+ 3.8; na++ 143; liver normal albumin 4.0  12-20-15: wbc 5.2; hgb 11.0; hct 32.4; mcv 101.3; plt 107; glucose 138; bun 31; creat 1.61; k+ 3.2; na++ 138; liver normal albumin 3.9; urine culture: e-coli: rocephin 12-21-15: wbc 3.9; hgb 9.8; hct 29.7; mcv 103.1; plt 98; glucose 108; bun 23; creat 1.15; k+ 3.2; na++ 143; mag 2.4 12-22-15: wbc 5.1; hgb 10.8; hct 32.3; mcv 103.2; plt 101; glucose 125; bun 18; creat 1.09; k+ 3.9; na++ 142  01-04-16: wbc 4.1; hgb 10.9; hct 32.4; mcv 100.5; plt 175; glucose 121; bun 13.2; creat 1.18; k+ 3.8; na++ 144;  liver normal albumin 3.8 01-31-16; urine culture: e-coli: ESBL invanz 02-15-16: wbc 4.6; hgb 11.3; hct 36.3; mcv 102.7; plt 128; glucose 160; bun 15.3; creat 1.65; k+ 4.5; na++ 143; liver normal albumin 3.9 02-19-16: glucose 116; bun 28.7; mcv 1.72; k+ 4.5; na++ 143 02-23-16; wbc 3.5 hgb 9.7; hct 30.4; mcv 101.8; plt 126; glucose 118; bun 25.6; creat 1.52; k+ 4.6; na++ 144    03-11-16: glucose 196; bun 18.4; creat 1.28; k+ 3.7; na++ 141  03-19-16: A/B neg for influenza 04-01-16: glucose 139; bun 15.7; creat 1.16; k+ 3.1; na++ 144 04-07-16: glucose 140; bun 18.2; creat 1.37; k+ 4.1; na++ 143  04-17-16: wbc 5.0; hgb 11.1; hct 33.3; mcv 100.6; plt 119; glucose 178; bun 19; creat 1.22; k+ 3.7; na++ 141; liver normal albumin 3.8 04-18-16: wbc 4.0; hgb 9.3; hct 28.8; mcv 101.1; plt 93; glucose 124; bun 17; creat 1.20; k+ 2.9; na++ 141; liver normal albumin 3.2; mag 1.7 phos 3.7; tsh 2.572; hgb a1c 6.9 04-20-16: wbc 5.5; hgb 10.2; hct 30.9; mcv 100.0; plt 97; glucose 103; bun 14; create 1.00; k+ 3.7; na++ 143; mag 1.8  04-24-16:glucose 132; bun 17; creat 1.05; k+ 4.3; na++ 138     Review of Systems  Constitutional: Negative for appetite change and fatigue.  HENT: Negative for congestion.   Respiratory: Negative for cough, chest tightness and shortness of breath.   Cardiovascular: Negative for chest pain, palpitations and leg swelling.  Gastrointestinal: Negative for nausea, abdominal pain, diarrhea and constipation.  Musculoskeletal: no complaint of muscle or joint pain    Skin: Negative for pallor.  Neurological: Negative for dizziness.  Psychiatric/Behavioral: The patient is not nervous/anxious.       Physical Exam  Constitutional: No distress.  Eyes: Conjunctivae are normal.  Neck: Neck supple. No JVD present. No thyromegaly present.  Cardiovascular: Normal rate, regular rhythm and intact distal pulses.   Respiratory: Effort normal and breath sounds normal. No respiratory distress. She has no wheezes.  Is 02 dependent  GI: Soft. Bowel sounds hypoactive; distended no tenderness  Musculoskeletal: She exhibits no edema.  Able to move all extremities .   Lymphadenopathy:    She has no cervical adenopathy.  Neurological: She is alert.  Skin: Skin is warm and dry. She is not diaphoretic.  Psychiatric: She has a normal mood and affect.     ASSESSMENT/  PLAN:  1. Hypertension: will continue norvasc 2.5 mg daily coreg 3.15 mg daily   2. Chronic diastolic heart failure: is presently not on diuretic  Will not make changes will monitor   3. Duodenal ulcer: will continue protonix 40 mg  daily  Has zofran 8 mg every 8 hours as needed   4. UTI: will continue cranberry daily      5. Anemia: will continue iron daily hgb is 10.2  6. Chronic afib: will continue coreg 3.125 mg daily for rate control. Will continue asa 81mg  daily .    7. Chronic respiratory failure: is 02 dependent. Has duoneb every 6 hours as needed   8. Allergic rhinitis: will continue flonase nightly and claritin 10 mg daily   9. Chronic pain due to osteoarthritis: will continue lidoderm to lower back daily; will continue baclofen 5 mg nightly for leg spasticity has vicodin 5/325 mg 1 or 2 tabs every 4 hours as needed for pain   10. Depression: is stable receives benefit form lexapro 10 mg daily   11. Chronic constipation  Has recurrent sigmoid volvulus: will continue colace 200 mg daily  linzess 145 mcg daily; senna twice daily miralax daily   Her family has decided upon conservative treatment including repeat decompression by endoscope if needed.   12. Glaucoma: will continue  trusopt to both eyes three times daily; travatan to both eyes nightly   13. CKD stage III: bun/creat 17/1.05   14. Hypokalemia: k+ 4.3; will continue k+ 40 meq daily    MD is aware of resident's narcotic use and is in agreement with current plan of care. We will attempt to wean resident as apropriate   Ok Edwards NP [facility]  Contact [phone] Monday through Friday 8am- 5pm  After hours call [phone]

## 2016-06-06 ENCOUNTER — Non-Acute Institutional Stay (SKILLED_NURSING_FACILITY): Payer: Medicare Other | Admitting: Internal Medicine

## 2016-06-06 ENCOUNTER — Encounter: Payer: Self-pay | Admitting: Internal Medicine

## 2016-06-06 DIAGNOSIS — M159 Polyosteoarthritis, unspecified: Secondary | ICD-10-CM

## 2016-06-06 DIAGNOSIS — J9611 Chronic respiratory failure with hypoxia: Secondary | ICD-10-CM | POA: Diagnosis not present

## 2016-06-06 DIAGNOSIS — R0781 Pleurodynia: Secondary | ICD-10-CM

## 2016-06-06 DIAGNOSIS — M150 Primary generalized (osteo)arthritis: Secondary | ICD-10-CM

## 2016-06-06 DIAGNOSIS — G8929 Other chronic pain: Secondary | ICD-10-CM

## 2016-06-06 DIAGNOSIS — F0390 Unspecified dementia without behavioral disturbance: Secondary | ICD-10-CM | POA: Diagnosis not present

## 2016-06-06 NOTE — Progress Notes (Signed)
Patient ID: Maria Christian, female   DOB: 13-Sep-1924, 83 y.o.   MRN: [id number]    DATE:  06/06/2016  Location:    [facility] Room Number: [id number] A Place of Service: SNF (31)   Extended Emergency Contact Information Primary Emergency Contact: [name] [place] Phone: [phone] Relation: Son Secondary Emergency Contact: Roberts,Lynn Address: [address]          [place], [place] [address] [place] Phone: [phone] Mobile Phone: [phone] Relation: Daughter  Advanced Directive information Does Patient Have a Medical Advance Directive?: Yes, Type of Advance Directive: Out of facility DNR (pink MOST or yellow form), Pre-existing out of facility DNR order (yellow form or pink MOST form): Yellow form placed in chart (order not valid for inpatient use);Pink MOST form placed in chart (order not valid for inpatient use)  Chief Complaint  Patient presents with  . Acute Visit    left side pain    HPI:  83 yo female long term resident seen today for left lateral CW pain x few days.  Pain radiates into pelvis. No known trauma. Pain worsens with movement and deep inspiration. No SOB, wheezing, rhonchi. She reports a "good" BM earlier today. Staff uses hoyer lift to transfer and no known injury. She is a poor historian due to dementia. Hx obtained from chart.  Hypertension - BP stable on norvasc 2.5 mg daily; coreg 3.15 mg daily   Chronic diastolic heart failure - stable off diuretic. No recent weight flucuations   Duodenal ulcer - stable on protonix 40 mg  Daily; has zofran 8 mg every 8 hours as needed   Hx Anemia - stable on iron daily. last hgb 10.2  PAF - rate controlled on coreg 3.125 mg daily; also takes ASA 81mg  daily for anticoagulation.    Chronic respiratory failure/O2 dependent - she gets duoneb every 6 hours as needed   Chronic pain due to osteoarthritis - stable on lidoderm to lower back daily; baclofen 5 mg  nightly for leg spasticity; vicodin 5/325 mg 1 or 2 tabs every 4 hours as needed for pain   Chronic constipation with hx recurrent sigmoid volvulus - stable on colace 200 mg daily; linzess 145 mcg daily; senna twice daily; miralax daily. Her family has decided upon conservative treatment including repeat decompression by endoscope if needed.   Dementia - stable without medication. No behavioral disturbance. She does not wander  Past Medical History:  Diagnosis Date  . Altered mental status   . Anemia   . Atrial fibrillation ([facility])   . Constipation 02/27/2015  . Coronary artery disease   . DEMENTIA   . Depression   . Diabetes mellitus   . Edema of lower extremity 07/13/11   right leg more swollen than left leg  . Encephalopathy   . Enlarged heart   . Esophageal dysmotility 07/02/12  . GERD (gastroesophageal reflux disease)   . History of adenomatous polyp of colon 06/24/99  . Horseshoe kidney   . Hyperlipemia   . Hypertension   . Pancreatic lesion 05/22/11   no further workup  per PCP/family due to age  . Parkinson disease ([facility])   . Peripheral neuropathy ([facility])   . Sigmoid volvulus ([facility]) 05/24/2013  . Thrombophlebitis   . TIA (transient ischemic attack) 12/19/2012    Past Surgical History:  Procedure Laterality Date  . ABDOMINAL HYSTERECTOMY    . BOWEL DECOMPRESSION N/A 04/18/2016   Procedure: BOWEL DECOMPRESSION;  Surgeon: Ulice Dash  Everitt Amber, MD;  Location: [facility];  Service: Endoscopy;  Laterality: N/A;  . BREAST SURGERY     2 benign tumors removed left breast  . CHOLECYSTECTOMY    . ESOPHAGEAL DILATION     several times by Dr. [name]  . ESOPHAGOGASTRODUODENOSCOPY (EGD) WITH ESOPHAGEAL DILATION N/A 08/01/2012   Procedure: ESOPHAGOGASTRODUODENOSCOPY (EGD) WITH ESOPHAGEAL DILATION;  Surgeon: [name], MD;  Location: [facility];  Service: Endoscopy;  Laterality: N/A;  with c-arm savory dilators  . ESOPHAGOGASTRODUODENOSCOPY (EGD) WITH PROPOFOL N/A 03/12/2015    Procedure: ESOPHAGOGASTRODUODENOSCOPY (EGD) WITH PROPOFOL;  Surgeon: [name], MD;  Location: [facility];  Service: Endoscopy;  Laterality: N/A;  . FLEXIBLE SIGMOIDOSCOPY N/A 05/24/2013   Procedure: FLEXIBLE SIGMOIDOSCOPY;  Surgeon: [name], MD;  Location: [facility];  Service: Endoscopy;  Laterality: N/A;  . FLEXIBLE SIGMOIDOSCOPY N/A 05/26/2013   Procedure:  flex with decompression of sigmoid volvulus;  Surgeon: [name], MD;  Location: [facility];  Service: Endoscopy;  Laterality: N/A;  . FLEXIBLE SIGMOIDOSCOPY N/A 12/20/2015   Procedure: FLEXIBLE SIGMOIDOSCOPY;  Surgeon: Milus Banister, MD;  Location: [facility];  Service: Endoscopy;  Laterality: N/A;  . FLEXIBLE SIGMOIDOSCOPY N/A 04/17/2016   Procedure: FLEXIBLE SIGMOIDOSCOPY;  Surgeon: Ladene Artist, MD;  Location: [facility];  Service: Endoscopy;  Laterality: N/A;  . FLEXIBLE SIGMOIDOSCOPY N/A 04/18/2016   Procedure: FLEXIBLE SIGMOIDOSCOPY;  Surgeon: [name], MD;  Location: [facility];  Service: Endoscopy;  Laterality: N/A;  . FOOT SURGERY     benign tumors from foot  . NOSE SURGERY    . SHOULDER SURGERY  2001   left clavicle excision and acromioplasty  . TOTAL HIP ARTHROPLASTY      Patient Care Team: [name], DO as PCP - General (Internal Medicine) Gerlene Fee, NP as Nurse Practitioner (Nurse Practitioner)  Social History   Social History  . Marital status: Widowed    Spouse name: N/A  . Number of children: 5  . Years of education: 10th   Occupational History  . Retired    Social History Main Topics  . Smoking status: Former Smoker    Quit date: 10/09/1964  . Smokeless tobacco: Never Used  . Alcohol use No  . Drug use: No  . Sexual activity: No   Other Topics Concern  . Not on file   Social History Narrative   Pt [facility] at [facility] [place].   Caffeine Use: very small amount daily     reports that she quit smoking about 51 years ago. She has never used  smokeless tobacco. She reports that she does not drink alcohol or use drugs.  Family History  Problem Relation Age of Onset  . Cancer    . Heart disease     Family Status  Relation Status  . Mother Deceased at age 62   blood clots  . Father Deceased at age 27   cirrohis of the liver  .      Immunization History  Administered Date(s) Administered  . Influenza-Unspecified 07/13/2015, 03/09/2016  . PPD Test 07/28/2013, 02/18/2016, 02/25/2016    Allergies  Allergen Reactions  . Aspirin Other (See Comments)    G.I. Upset only    Medications: Patient's Medications  New Prescriptions   No medications on file  Previous Medications   AMLODIPINE (NORVASC) 2.5 MG TABLET    Take 2.5 mg by mouth every morning. Hold for BP < 100/60 or HR < 60  ASPIRIN EC 81 MG TABLET    Take 81 mg by mouth daily.   BACLOFEN (LIORESAL) 10 MG TABLET    Take 5 mg by mouth at bedtime.   BENZOCAINE-MENTHOL (CHLORASEPTIC) 6-10 MG LOZENGE    Take 1 lozenge by mouth as needed for sore throat.   BIOTIN 5 MG/ML LIQD    Take 5 mLs by mouth 2 (two) times daily.    CARVEDILOL (COREG) 3.125 MG TABLET    Take 3.125 mg by mouth daily.   CHOLECALCIFEROL (VITAMIN D) 1000 UNITS TABLET    Take 1,000 Units by mouth every morning.    CRANBERRY 475 MG CAPS    Take 1 capsule by mouth 2 (two) times daily.    DOCUSATE SODIUM (COLACE) 100 MG CAPSULE    Take 200 mg by mouth at bedtime.    DORZOLAMIDE (TRUSOPT) 2 % OPHTHALMIC SOLUTION    Place 1 drop into both eyes 3 (three) times daily. Reported on 11/16/2015        0800,1400,2000   ESCITALOPRAM (LEXAPRO) 10 MG TABLET    Take 10 mg by mouth daily.   FEEDING SUPPLEMENT, ENSURE ENLIVE, (ENSURE ENLIVE) LIQD    Take 237 mLs by mouth 2 (two) times daily between meals.   FERROUS SULFATE 325 (65 FE) MG TABLET    Take 325 mg by mouth daily with breakfast.   FLUTICASONE (FLONASE) 50 MCG/ACT NASAL SPRAY    Place 2 sprays into both nostrils at bedtime.   HYDROCODONE-ACETAMINOPHEN  (NORCO/VICODIN) 5-325 MG TABLET    Take 1-2 tablets by mouth every 4 (four) hours as needed for moderate pain.   IPRATROPIUM-ALBUTEROL (DUONEB) 0.5-2.5 (3) MG/3ML SOLN    Take 3 mLs by nebulization daily as needed.   LIDOCAINE (LIDODERM) 5 %    Place 1 patch onto the skin daily. Apply 1 patch to left lower back/hip every morning.  Remove & Discard patch within 12 hours or as directed by MD   LINACLOTIDE (LINZESS) 145 MCG CAPS CAPSULE    Take 145 mcg by mouth daily before breakfast.    LORATADINE (CLARITIN) 10 MG TABLET    Take 10 mg by mouth daily before breakfast.    MULTIPLE VITAMINS-MINERALS (CERTAGEN PO)    Take 1 tablet by mouth daily.    ONDANSETRON (ZOFRAN) 8 MG TABLET    Take 8 mg by mouth every 8 (eight) hours as needed for nausea or vomiting.   OXYGEN    Inhale 2 L into the lungs continuous.   POLYETHYLENE GLYCOL (MIRALAX / GLYCOLAX) PACKET    Take 17 g by mouth daily at 6 (six) AM.    POTASSIUM CHLORIDE ER 20 MEQ TBCR    Take 40 mEq by mouth daily.   PROTONIX 40 MG PACK    Take 40 mg by mouth daily at 6 (six) AM.   SENNA (SENOKOT) 8.6 MG TABS TABLET    Take 1 tablet (8.6 mg total) by mouth 2 (two) times daily.   SKIN PROTECTANTS, MISC. (CALAZIME SKIN PROTECTANT EX)    Apply 1 application topically daily. Buttocks protection   SODIUM CHLORIDE (OCEAN) 0.65 % SOLN NASAL SPRAY    Place 1 spray into both nostrils as directed. Four times daily And every 24 hours as needed to moisturize nasal passages.   TORSEMIDE (DEMADEX) 20 MG TABLET    Take 20 mg by mouth daily.   TRAVOPROST, BAK FREE, (TRAVATAN) 0.004 % SOLN OPHTHALMIC SOLUTION    Place 1 drop into both eyes daily at  8 pm.    VITAMIN B-12 (CYANOCOBALAMIN) 500 MCG TABLET    Take 500 mcg by mouth every morning.   Modified Medications   No medications on file  Discontinued Medications   No medications on file    Review of Systems  Unable to perform ROS: Dementia    Vitals:   06/06/16 1505  BP: 120/65  Pulse: 63  Resp: 18  Temp:  97.4 F (36.3 C)  TempSrc: Oral  SpO2: 97%  Weight: 152 lb 4.8 oz (69.1 kg)  Height: 5\' 7"  (1.702 m)   Body mass index is 23.85 kg/m.  Physical Exam  Constitutional: She appears well-developed.  Frail appearing in NAD, lying in bed  Pulmonary/Chest: No respiratory distress. She exhibits tenderness (left lateral).  Abdominal: Soft. Bowel sounds are normal. She exhibits no distension and no mass. There is no tenderness. There is no rebound and no guarding.  Musculoskeletal: She exhibits tenderness.  Left rib 5-6 stuck down and TTP. No bony deformity  Neurological: She is alert.  Skin: No rash noted.  Psychiatric: She has a normal mood and affect. Her behavior is normal.     Labs reviewed: Nursing Home on 06/06/2016  Component Date Value Ref Range Status  . Hemoglobin 04/26/2016 10.3* 12.0 - 16.0 g/dL Final  . HCT 04/26/2016 32* 36 - 46 % Final  . Platelets 04/26/2016 120* 150 - 399 K/L Final  . WBC 04/26/2016 4.5  10^3/mL Final  . Glucose 04/26/2016 123  mg/dL Final  . BUN 04/26/2016 11  4 - 21 mg/dL Final  . Creatinine 04/26/2016 1.0  0.5 - 1.1 mg/dL Final  . Potassium 04/26/2016 3.7  3.4 - 5.3 mmol/L Final  . Sodium 04/26/2016 143  137 - 147 mmol/L Final  Admission on 04/17/2016, Discharged on 04/24/2016  No results displayed because visit has over 200 results.    Nursing Home on 04/17/2016  Component Date Value Ref Range Status  . Glucose 04/07/2016 140  mg/dL Final  . BUN 04/07/2016 18  4 - 21 mg/dL Final  . Creatinine 04/07/2016 1.4* 0.5 - 1.1 mg/dL Final  . Potassium 04/07/2016 4.1  3.4 - 5.3 mmol/L Final  . Sodium 04/07/2016 143  137 - 147 mmol/L Final  Nursing Home on 03/21/2016  Component Date Value Ref Range Status  . Glucose 03/11/2016 196  mg/dL Final  . BUN 03/11/2016 18  4 - 21 mg/dL Final  . Creatinine 03/11/2016 1.3* 0.5 - 1.1 mg/dL Final  . Potassium 03/11/2016 3.7  3.4 - 5.3 mmol/L Final  . Sodium 03/11/2016 141  137 - 147 mmol/L Final    No  results found.   Assessment/Plan   ICD-9-CM ICD-10-CM   1. Rib pain on left side 786.50 R07.81    strain vs sprain  2. Other chronic pain 338.29 G89.29   3. Primary osteoarthritis involving multiple joints 715.09 M15.0   4. Chronic respiratory failure with hypoxia (HCC) 518.83 J96.11    799.02    5. Dementia without behavioral disturbance, unspecified dementia type 294.20 F03.90    Check left rib xray to r/o fx  Pain control  May benefit from PT  Will follow  Dalis Beers S. Perlie Gold  [facility] [address] [place], [place] [address] [phone] Cell (Monday-Friday 8 AM - 5 PM) [phone] After 5 PM and follow prompts

## 2016-06-08 ENCOUNTER — Non-Acute Institutional Stay (SKILLED_NURSING_FACILITY): Payer: Medicare Other | Admitting: Internal Medicine

## 2016-06-08 DIAGNOSIS — R05 Cough: Secondary | ICD-10-CM

## 2016-06-08 DIAGNOSIS — R059 Cough, unspecified: Secondary | ICD-10-CM

## 2016-06-08 DIAGNOSIS — R0781 Pleurodynia: Secondary | ICD-10-CM

## 2016-06-11 NOTE — Progress Notes (Signed)
The date is 06/08/2016   This is an acute visit.  Level care skilled.  Facility is Psych[facility], sport and exercise complaint-acute visit follow-up left rib pain-cough with white phlegm production.  History of present illness.  Patient is a 83 year old female who was seen earlier this week by Dr. Eulas Post complaining of left rib pain-x-ray was ordered which did not really show any acute process.  Daughter states her mother does have a cough however productive of some white yellowish phlegm.  Patient does have a history of respiratory failure she is oxygen dependent with a history of diastolic CHF.  She also has a history of allergic rhinitis and continues on Flonase as well as Claritin  She is not really complaining of increased shortness of breath beyond baseline but she is bothered somewhat by her cough.  Past Medical History:  Diagnosis Date  . Altered mental status   . Anemia   . Atrial fibrillation ([place])   . Constipation 02/27/2015  . Coronary artery disease   . DEMENTIA   . Depression   . Diabetes mellitus   . Edema of lower extremity 07/13/11   right leg more swollen than left leg  . Encephalopathy   . Enlarged heart   . Esophageal dysmotility 07/02/12  . GERD (gastroesophageal reflux disease)   . History of adenomatous polyp of colon 06/24/99  . Horseshoe kidney   . Hyperlipemia   . Hypertension   . Pancreatic lesion 05/22/11   no further workup  per PCP/family due to age  . Parkinson disease ([place])   . Peripheral neuropathy ([place])   . Sigmoid volvulus ([place]) 05/24/2013  . Thrombophlebitis   . TIA (transient ischemic attack) 12/19/2012         Past Surgical History:  Procedure Laterality Date  . ABDOMINAL HYSTERECTOMY    . BOWEL DECOMPRESSION N/A 04/18/2016   Procedure: BOWEL DECOMPRESSION;  Surgeon: [name], MD;  Location: [facility];  Service: Endoscopy;  Laterality: N/A;  . BREAST SURGERY     2 benign tumors removed left  breast  . CHOLECYSTECTOMY    . ESOPHAGEAL DILATION     several times by Dr. [name]  . ESOPHAGOGASTRODUODENOSCOPY (EGD) WITH ESOPHAGEAL DILATION N/A 08/01/2012   Procedure: ESOPHAGOGASTRODUODENOSCOPY (EGD) WITH ESOPHAGEAL DILATION;  Surgeon: [name], MD;  Location: [facility];  Service: Endoscopy;  Laterality: N/A;  with c-arm savory dilators  . ESOPHAGOGASTRODUODENOSCOPY (EGD) WITH PROPOFOL N/A 03/12/2015   Procedure: ESOPHAGOGASTRODUODENOSCOPY (EGD) WITH PROPOFOL;  Surgeon: [name], MD;  Location: [facility];  Service: Endoscopy;  Laterality: N/A;  . FLEXIBLE SIGMOIDOSCOPY N/A 05/24/2013   Procedure: FLEXIBLE SIGMOIDOSCOPY;  Surgeon: [name], MD;  Location: [facility];  Service: Endoscopy;  Laterality: N/A;  . FLEXIBLE SIGMOIDOSCOPY N/A 05/26/2013   Procedure:  flex with decompression of sigmoid volvulus;  Surgeon: [name], MD;  Location: [facility];  Service: Endoscopy;  Laterality: N/A;  . FLEXIBLE SIGMOIDOSCOPY N/A 12/20/2015   Procedure: FLEXIBLE SIGMOIDOSCOPY;  Surgeon: Milus Banister, MD;  Location: [facility];  Service: Endoscopy;  Laterality: N/A;  . FLEXIBLE SIGMOIDOSCOPY N/A 04/17/2016   Procedure: FLEXIBLE SIGMOIDOSCOPY;  Surgeon: Ladene Artist, MD;  Location: [facility];  Service: Endoscopy;  Laterality: N/A;  . FLEXIBLE SIGMOIDOSCOPY N/A 04/18/2016   Procedure: FLEXIBLE SIGMOIDOSCOPY;  Surgeon: [name], MD;  Location: [facility];  Service: Endoscopy;  Laterality: N/A;  . FOOT SURGERY     benign tumors from foot  . NOSE SURGERY    .  SHOULDER SURGERY  2001   left clavicle excision and acromioplasty  . TOTAL HIP ARTHROPLASTY      Patient Care Team: [name], DO as PCP - General (Internal Medicine) Gerlene Fee, NP as Nurse Practitioner (Nurse Practitioner)  Social History        Social History  . Marital status: Widowed    Spouse name: N/A  . Number of children: 5  . Years of  education: 10th       Occupational History  . Retired         Social History Main Topics  . Smoking status: Former Smoker    Quit date: 10/09/1964  . Smokeless tobacco: Never Used  . Alcohol use No  . Drug use: No  . Sexual activity: No       Other Topics Concern  . Not on file      Social History Narrative   Pt lives at [facility] and [place].   Caffeine Use: very small amount daily     reports that she quit smoking about 51 years ago. She has never used smokeless tobacco. She reports that she does not drink alcohol or use drugs.       Family History  Problem Relation Age of Onset  . Cancer    . Heart disease         Family Status  Relation Status  . Mother Deceased at age 56   blood clots  . Father Deceased at age 36   cirrohis of the liver  .          Immunization History  Administered Date(s) Administered  . Influenza-Unspecified 07/13/2015, 03/09/2016  . PPD Test 07/28/2013, 02/18/2016, 02/25/2016         Allergies  Allergen Reactions  . Aspirin Other (See Comments)    G.I. Upset only    Medications:     Patient's Medications  New Prescriptions   No medications on file  Previous Medications   AMLODIPINE (NORVASC) 2.5 MG TABLET    Take 2.5 mg by mouth every morning. Hold for BP < 100/60 or HR < 60   ASPIRIN EC 81 MG TABLET    Take 81 mg by mouth daily.   BACLOFEN (LIORESAL) 10 MG TABLET    Take 5 mg by mouth at bedtime.   BENZOCAINE-MENTHOL (CHLORASEPTIC) 6-10 MG LOZENGE    Take 1 lozenge by mouth as needed for sore throat.   BIOTIN 5 MG/ML LIQD    Take 5 mLs by mouth 2 (two) times daily.    CARVEDILOL (COREG) 3.125 MG TABLET    Take 3.125 mg by mouth daily.   CHOLECALCIFEROL (VITAMIN D) 1000 UNITS TABLET    Take 1,000 Units by mouth every morning.    CRANBERRY 475 MG CAPS    Take 1 capsule by mouth 2 (two) times daily.    DOCUSATE SODIUM (COLACE) 100 MG CAPSULE    Take 200 mg by mouth at bedtime.      DORZOLAMIDE (TRUSOPT) 2 % OPHTHALMIC SOLUTION    Place 1 drop into both eyes 3 (three) times daily. Reported on 11/16/2015        0800,1400,2000   ESCITALOPRAM (LEXAPRO) 10 MG TABLET    Take 10 mg by mouth daily.   FEEDING SUPPLEMENT, ENSURE ENLIVE, (ENSURE ENLIVE) LIQD    Take 237 mLs by mouth 2 (two) times daily between meals.   FERROUS SULFATE 325 (65 FE) MG TABLET    Take 325 mg by mouth  daily with breakfast.   FLUTICASONE (FLONASE) 50 MCG/ACT NASAL SPRAY    Place 2 sprays into both nostrils at bedtime.   HYDROCODONE-ACETAMINOPHEN (NORCO/VICODIN) 5-325 MG TABLET    Take 1-2 tablets by mouth every 4 (four) hours as needed for moderate pain.   IPRATROPIUM-ALBUTEROL (DUONEB) 0.5-2.5 (3) MG/3ML SOLN    Take 3 mLs by nebulization daily as needed.   LIDOCAINE (LIDODERM) 5 %    Place 1 patch onto the skin daily. Apply 1 patch to left lower back/hip every morning.  Remove & Discard patch within 12 hours or as directed by MD   LINACLOTIDE (LINZESS) 145 MCG CAPS CAPSULE    Take 145 mcg by mouth daily before breakfast.    LORATADINE (CLARITIN) 10 MG TABLET    Take 10 mg by mouth daily before breakfast.    MULTIPLE VITAMINS-MINERALS (CERTAGEN PO)    Take 1 tablet by mouth daily.    ONDANSETRON (ZOFRAN) 8 MG TABLET    Take 8 mg by mouth every 8 (eight) hours as needed for nausea or vomiting.   OXYGEN    Inhale 2 L into the lungs continuous.   POLYETHYLENE GLYCOL (MIRALAX / GLYCOLAX) PACKET    Take 17 g by mouth daily at 6 (six) AM.    POTASSIUM CHLORIDE ER 20 MEQ TBCR    Take 40 mEq by mouth daily.   PROTONIX 40 MG PACK    Take 40 mg by mouth daily at 6 (six) AM.   SENNA (SENOKOT) 8.6 MG TABS TABLET    Take 1 tablet (8.6 mg total) by mouth 2 (two) times daily.   SKIN PROTECTANTS, MISC. (CALAZIME SKIN PROTECTANT EX)    Apply 1 application topically daily. Buttocks protection   SODIUM CHLORIDE (OCEAN) 0.65 % SOLN NASAL SPRAY    Place 1 spray into both nostrils as directed. Four times  daily And every 24 hours as needed to moisturize nasal passages.   TORSEMIDE (DEMADEX) 20 MG TABLET    Take 20 mg by mouth daily.   TRAVOPROST, BAK FREE, (TRAVATAN) 0.004 % SOLN OPHTHALMIC SOLUTION    Place 1 drop into both eyes daily at 8 pm.    VITAMIN B-12 (CYANOCOBALAMIN) 500 MCG TABLET    Take 500 mcg by mouth every morning.   Modified Medications   No medications on file  Discontinued Medications   No medications on file    Review of Systems General is not complaining of fever or chills but she does complain of some left rib pain aggravated which takes a deep breath or coughs.  Skin does not complain of rashes itching or diaphoresis.  Head ears eyes nose mouth and throat does not complaining of visual changes or sore throat.  Does have a history of allergic rhinitis with at times a running congested nose.  Respiratory complains of cough as noted above is oxygen dependent does not complain of increased shortness of breath from baseline.  Cardiac does not complaining of chest pain.  GI is not complaining of nausea vomiting diarrhea constipation does have a history of volvulus  of sigmoid colon.  Is not really complaining of abdominal pain.  Muscle skeletal does have weakness but does not complaining of joint pain.  Neurologic not complaining of dizziness or acute headache.   Psych at times does have some anxiety      Vitals:   06/06/16 1505  Temperature 97.2 pulse 60 respirations 17 blood pressure 122/64 98% O2 stats   Physical Exam  Constitutional: She appears well-developed.  Frail appearing in NAD,  Her skin is warm and dry.  Oropharynx clear mucous membranes fairly moist.   Pulmonary/Chest: No respiratory distress. She exhibits tenderness (left lateral). There is no edema or erythema or warmth Shallow air entry could not really appreciate congestion  Heart is regular irregular rate and rhythm there is some mild  baseline right leg edema  Abdominal: Soft. Bowel sounds are normal. She exhibits no distension and no mass. There is no tenderness. There is no rebound and no guarding.  Musculoskeletal: She exhibits tenderness.  Left rib 5-6 stuck down and TTP. No bony deformity  Neurological: She is alert. Could not really appreciate overt lateralizing findings Skin: No rash noted.  Psychiatric: She has a normal mood and affect. Her behavior is normal. Slightly anxious    Labs reviewed          Nursing Home on 06/06/2016  Component Date Value Ref Range Status  . Hemoglobin 04/26/2016 10.3* 12.0 - 16.0 g/dL Final  . HCT 04/26/2016 32* 36 - 46 % Final  . Platelets 04/26/2016 120* 150 - 399 K/L Final  . WBC 04/26/2016 4.5  10^3/mL Final  . Glucose 04/26/2016 123  mg/dL Final  . BUN 04/26/2016 11  4 - 21 mg/dL Final  . Creatinine 04/26/2016 1.0  0.5 - 1.1 mg/dL Final  . Potassium 04/26/2016 3.7  3.4 - 5.3 mmol/L Final  . Sodium 04/26/2016 143  137 - 147 mmol/L Final  Admission on 04/17/2016, Discharged on 04/24/2016  No results displayed because visit has over 200 results.      Nursing Home on 04/17/2016  Component Date Value Ref Range Status  . Glucose 04/07/2016 140  mg/dL Final  . BUN 04/07/2016 18  4 - 21 mg/dL Final  . Creatinine 04/07/2016 1.4* 0.5 - 1.1 mg/dL Final  . Potassium 04/07/2016 4.1  3.4 - 5.3 mmol/L Final  . Sodium 04/07/2016 143  137 - 147 mmol/L Final  Nursing Home on 03/21/2016  Component Date Value Ref Range Status  . Glucose 03/11/2016 196  mg/dL Final  . BUN 03/11/2016 18  4 - 21 mg/dL Final  . Creatinine 03/11/2016 1.3* 0.5 - 1.1 mg/dL Final  . Potassium 03/11/2016 3.7  3.4 - 5.3 mmol/L Final  . Sodium 03/11/2016 141  137 - 147 mmol/L Final    ImagingResults   Assessment and plan.  #1 left rib discomfort-discussed this with patient and her daughter in the room-this could be more muscle strain irritation from coughing-chest x-ray per radiology  interpretation did not show any acute process nursing staff is obtaining  further details about any possible pulmonary infiltrate-although I suspect this will be negative and would have been in the initial radiology report.  At this point continue to monitor vital signs pulse ox every shift for 48 hours will add Mucinex 600 mg twice a day for 5 days for cough-if she develops fever chills or cough appears to be worsening certainly consider recheck of chest x-ray and possible antibiotic but at this point she appears to be stable   Also will check a CBC with differential and basic metabolic panel tomorrow.  [id number] of note greater than 25 minutes spent assessing patient-reviewing her chart reviewing her labs-discussing her status with nursing staff as well as with her daughter at bedside-and coordinating and formulating a plan of care-of note greater than 50% of time  spent coordinating plan of care

## 2016-06-14 ENCOUNTER — Non-Acute Institutional Stay (SKILLED_NURSING_FACILITY): Payer: Medicare Other | Admitting: Internal Medicine

## 2016-06-14 ENCOUNTER — Encounter: Payer: Self-pay | Admitting: Internal Medicine

## 2016-06-14 DIAGNOSIS — F05 Delirium due to known physiological condition: Secondary | ICD-10-CM

## 2016-06-14 DIAGNOSIS — D696 Thrombocytopenia, unspecified: Secondary | ICD-10-CM | POA: Diagnosis not present

## 2016-06-14 DIAGNOSIS — R410 Disorientation, unspecified: Secondary | ICD-10-CM

## 2016-06-14 DIAGNOSIS — N289 Disorder of kidney and ureter, unspecified: Secondary | ICD-10-CM | POA: Diagnosis not present

## 2016-06-14 DIAGNOSIS — B370 Candidal stomatitis: Secondary | ICD-10-CM

## 2016-06-14 NOTE — Progress Notes (Signed)
This is a acute visit.  Level of care skilled.  Facility is [facility] [place]  This is an acute visit.  Chief complaint-acute visit secondary to throat discomfort-possible confusion.  History of present illness.  Patient is an 83 year old female who is complaining of throat pain.  She states this started apparently yesterday-she said she did eat some breakfast but it wasn't very comfortable.  She is not complaining of any chest pain radiation of the pain fever or chills.  She says the pain is more in her throat and on the left side of her lower mouth.  She denies any trauma.  She does have a history of dentures but currently is not wearing them.  Last week it did see her for some left-sided suspected rib pain again x-ray was negative for any acute process she is not really complaining of pain there today she was having a cough and she has completed course of Mucinex.  She also continues on duo nebs once a day as needed she also is on Claritin  Nursing staff also is noted  some increased confusion with patient at times seeing objects in her room that aren't there-    Past Medical History:  Diagnosis Date  . Altered mental status   . Anemia   . Atrial fibrillation ([place])   . Constipation 02/27/2015  . Coronary artery disease   . DEMENTIA   . Depression   . Diabetes mellitus   . Edema of lower extremity 07/13/11   right leg more swollen than left leg  . Encephalopathy   . Enlarged heart   . Esophageal dysmotility 07/02/12  . GERD (gastroesophageal reflux disease)   . History of adenomatous polyp of colon 06/24/99  . Horseshoe kidney   . Hyperlipemia   . Hypertension   . Pancreatic lesion 05/22/11   no further workup per PCP/family due to age  . Parkinson disease ([place])   . Peripheral neuropathy ([place])   . Sigmoid volvulus ([place]) 05/24/2013  . Thrombophlebitis   . TIA (transient ischemic attack) 12/19/2012         Past Surgical  History:  Procedure Laterality Date  . ABDOMINAL HYSTERECTOMY    . BOWEL DECOMPRESSION N/A 04/18/2016   Procedure: BOWEL DECOMPRESSION; Surgeon: [name], MD; Location: [facility]; Service: Endoscopy; Laterality: N/A;  . BREAST SURGERY     2 benign tumors removed left breast  . CHOLECYSTECTOMY    . ESOPHAGEAL DILATION     several times by Dr. [name]  . ESOPHAGOGASTRODUODENOSCOPY (EGD) WITH ESOPHAGEAL DILATION N/A 08/01/2012   Procedure: ESOPHAGOGASTRODUODENOSCOPY (EGD) WITH ESOPHAGEAL DILATION; Surgeon: [name], MD; Location: [facility]; Service: Endoscopy; Laterality: N/A; with c-arm savory dilators  . ESOPHAGOGASTRODUODENOSCOPY (EGD) WITH PROPOFOL N/A 03/12/2015   Procedure: ESOPHAGOGASTRODUODENOSCOPY (EGD) WITH PROPOFOL; Surgeon: [name], MD; Location: [facility]; Service: Endoscopy; Laterality: N/A;  . FLEXIBLE SIGMOIDOSCOPY N/A 05/24/2013   Procedure: FLEXIBLE SIGMOIDOSCOPY; Surgeon: [name], MD; Location: [facility]; Service: Endoscopy; Laterality: N/A;  . FLEXIBLE SIGMOIDOSCOPY N/A 05/26/2013   Procedure: flex with decompression of sigmoid volvulus; Surgeon: [name], MD; Location: [facility]; Service: Endoscopy; Laterality: N/A;  . FLEXIBLE SIGMOIDOSCOPY N/A 12/20/2015   Procedure: FLEXIBLE SIGMOIDOSCOPY; Surgeon: Milus Banister, MD; Location: [facility]; Service: Endoscopy; Laterality: N/A;  . FLEXIBLE SIGMOIDOSCOPY N/A 04/17/2016   Procedure: FLEXIBLE SIGMOIDOSCOPY; Surgeon: Ladene Artist, MD; Location: [facility]; Service: Endoscopy; Laterality: N/A;  . FLEXIBLE SIGMOIDOSCOPY N/A 04/18/2016   Procedure: FLEXIBLE SIGMOIDOSCOPY; Surgeon: Ulice Dash  Everitt Amber, MD; Location: [facility]; Service: Endoscopy; Laterality: N/A;  . FOOT SURGERY     benign tumors from foot  . NOSE SURGERY    . SHOULDER SURGERY  2001   left clavicle excision and acromioplasty  . TOTAL HIP ARTHROPLASTY       Patient Care Team: [name], DO as PCP - General (Internal Medicine) Gerlene Fee, NP as Nurse Practitioner (Nurse Practitioner)  Social History        Social History  . Marital status: Widowed    Spouse name: N/A  . Number of children: 5  . Years of education: 10th       Occupational History  . Retired         Social History Main Topics  . Smoking status: Former Smoker    Quit date: 10/09/1964  . Smokeless tobacco: Never Used  . Alcohol use No  . Drug use: No  . Sexual activity: No       Other Topics Concern  . Not on file      Social History Narrative   Pt [facility] at [facility] and [place].   Caffeine Use: very small amount daily    reports that she quit smoking about 51 years ago. She has never used smokeless tobacco. She reports that she does not drink alcohol or use drugs.       Family History  Problem Relation Age of Onset  . Cancer    . Heart disease         Family Status  Relation Status  . Mother Deceased at age 26   blood clots  . Father Deceased at age 86   cirrohis of the liver  .          Immunization History  Administered Date(s) Administered  . Influenza-Unspecified 07/13/2015, 03/09/2016  . PPD Test 07/28/2013, 02/18/2016, 02/25/2016         Allergies  Allergen Reactions  . Aspirin Other (See Comments)    G.I. Upset only    Medications:     Patient's Medications  New Prescriptions   No medications on file  Previous Medications   AMLODIPINE (NORVASC) 2.5 MG TABLET Take 2.5 mg by mouth every morning. Hold for BP <100/60 or HR <60   ASPIRIN EC 81 MG TABLET Take 81 mg by mouth daily.   BACLOFEN (LIORESAL) 10 MG TABLET Take 5 mg by mouth at bedtime.   BENZOCAINE-MENTHOL (CHLORASEPTIC) 6-10 MG LOZENGE Take 1 lozenge by mouth as needed for sore throat.   BIOTIN 5 MG/ML LIQD Take 5 mLs by mouth 2 (two) times daily.     CARVEDILOL (COREG) 3.125 MG TABLET Take 3.125 mg by mouth daily.   CHOLECALCIFEROL (VITAMIN D) 1000 UNITS TABLET Take 1,000 Units by mouth every morning.    CRANBERRY 475 MG CAPS Take 1 capsule by mouth 2 (two) times daily.    DOCUSATE SODIUM (COLACE) 100 MG CAPSULE Take 200 mg by mouth at bedtime.    DORZOLAMIDE (TRUSOPT) 2 % OPHTHALMIC SOLUTION Place 1 drop into both eyes 3 (three) times daily. Reported on 11/16/2015 0800,1400,2000   ESCITALOPRAM (LEXAPRO) 10 MG TABLET Take 10 mg by mouth daily.   FEEDING SUPPLEMENT, ENSURE ENLIVE, (ENSURE ENLIVE) LIQD Take 237 mLs by mouth 2 (two) times daily between meals.   FERROUS SULFATE 325 (65 FE) MG TABLET Take 325 mg by mouth daily with breakfast.   FLUTICASONE (FLONASE) 50 MCG/ACT NASAL SPRAY Place 2 sprays into both nostrils at bedtime.  HYDROCODONE-ACETAMINOPHEN (NORCO/VICODIN) 5-325 MG TABLET Take 1-2 tablets by mouth every 4 (four) hours as needed for moderate pain.   IPRATROPIUM-ALBUTEROL (DUONEB) 0.5-2.5 (3) MG/3ML SOLN Take 3 mLs by nebulization daily as needed.   LIDOCAINE (LIDODERM) 5 % Place 1 patch onto the skin daily. Apply 1 patch to left lower back/hip every morning. Remove &Discard patch within 12 hours or as directed by MD   LINACLOTIDE (LINZESS) 145 MCG CAPS CAPSULE Take 145 mcg by mouth daily before breakfast.    LORATADINE (CLARITIN) 10 MG TABLET Take 10 mg by mouth daily before breakfast.    MULTIPLE VITAMINS-MINERALS (CERTAGEN PO) Take 1 tablet by mouth daily.    ONDANSETRON (ZOFRAN) 8 MG TABLET Take 8 mg by mouth every 8 (eight) hours as needed for nausea or vomiting.   OXYGEN Inhale 2 L into the lungs continuous.   POLYETHYLENE GLYCOL (MIRALAX / GLYCOLAX) PACKET Take 17 g by mouth daily at 6 (six) AM.    POTASSIUM CHLORIDE ER 20 MEQ TBCR Take 40 mEq by mouth daily.   PROTONIX 40 MG PACK Take 40 mg by mouth daily at 6 (six) AM.   SENNA  (SENOKOT) 8.6 MG TABS TABLET Take 1 tablet (8.6 mg total) by mouth 2 (two) times daily.   SKIN PROTECTANTS, MISC. (CALAZIME SKIN PROTECTANT EX) Apply 1 application topically daily. Buttocks protection   SODIUM CHLORIDE (OCEAN) 0.65 % SOLN NASAL SPRAY Place 1 spray into both nostrils as directed. Four times daily And every 24 hours as needed to moisturize nasal passages.   TORSEMIDE (DEMADEX) 20 MG TABLET Take 20 mg by mouth daily.   TRAVOPROST, BAK FREE, (TRAVATAN) 0.004 % SOLN OPHTHALMIC SOLUTION Place 1 drop into both eyes daily at 8 pm.    VITAMIN B-12 (CYANOCOBALAMIN) 500 MCG TABLET Take 500 mcg by mouth every morning.   Modified Medications   No medications on file  Discontinued Medications   No medications on file    Review of Systems General is not complaining of fever or chills .  Skin does not complain of rashes itching or diaphoresis.  Head ears eyes nose mouth and throat does not complaining of visual changes She DOES complain of a sore throat and sore mouth  Does have a history of allergic rhinitis with at times a running congested nose.  Respiratory does not complain of increased shortness of breath from baseline. Or increased coughing  Cardiac does not complaining of chest pain.  GI is not complaining of nausea vomiting diarrhea constipation does have a history of volvulus  of sigmoid colon.  Is not complaining of abdominal pain.--Says she had a bowel movement this morning  Muscle skeletal does have weakness but does not complaining of joint pain.  Neurologic not complaining of dizziness or acute headache.   Psych at times does have some anxiety      Vitals:   06/06/16 1505     Temperature is 98.2 pulse 69 respirations 18 blood pressure 140/46                      Tem   Physical Exam Constitutional-- Frail appearing in NAD,  Her skin is warm and dry.  Oropharynx clear mucous membranes  fairly moist. She appears to have a white film on her tongue-and somewhat about her gumline I do not note any open areas or blisters   Pulmonary/Chest: No respiratory distress. Shallow air entry could not really appreciate congestion  Heart is regular irregular rate and rhythm there  is some mild baseline right leg edema--she has compression hose applied  Abdominal: Soft. Bowel sounds are normal. She exhibits no distensionand no mass. There is no tenderness. There is no reboundand no guarding.  Musculoskeletal: Ambulates in a wheelchair moves her extremities at baseline with some baseline lower extremity weakness Neurological: She is alert. Could not really appreciate overt lateralizing findings  Skin: No rashnoted.  Psychiatric: She has a normal mood and affect. Her behavior is normal. Slightly anxious she is pleasant and appropriate although again somewhat anxious appears to be relatively at her baseline some confusion but not more so than usual    Labs reviewed  06/11/2015.  WBC 4.5 hemoglobin 10.0 platelets 102,000.  Sodium 142 potassium 4.1 BUN 22.4-creatinine 1.33          Nursing Home on 06/06/2016  Component Date Value Ref Range Status  . Hemoglobin 04/26/2016 10.3* 12.0 - 16.0 g/dL Final  . HCT 04/26/2016 32* 36 - 46 % Final  . Platelets 04/26/2016 120* 150 - 399 K/L Final  . WBC 04/26/2016 4.5  10^3/mL Final  . Glucose 04/26/2016 123  mg/dL Final  . BUN 04/26/2016 11  4 - 21 mg/dL Final  . Creatinine 04/26/2016 1.0  0.5 - 1.1 mg/dL Final  . Potassium 04/26/2016 3.7  3.4 - 5.3 mmol/L Final  . Sodium 04/26/2016 143  137 - 147 mmol/L Final  Admission on 04/17/2016, Discharged on 04/24/2016  No results displayed because visit has over 200 results.      Nursing Home on 04/17/2016  Component Date Value Ref Range Status  . Glucose 04/07/2016 140  mg/dL Final  . BUN 04/07/2016 18  4 - 21 mg/dL Final  . Creatinine 04/07/2016 1.4* 0.5 - 1.1 mg/dL Final    . Potassium 04/07/2016 4.1  3.4 - 5.3 mmol/L Final  . Sodium 04/07/2016 143  137 - 147 mmol/L Final  Nursing Home on 03/21/2016  Component Date Value Ref Range Status  . Glucose 03/11/2016 196  mg/dL Final  . BUN 03/11/2016 18  4 - 21 mg/dL Final  . Creatinine 03/11/2016 1.3* 0.5 - 1.1 mg/dL Final  . Potassium 03/11/2016 3.7  3.4 - 5.3 mmol/L Final  . Sodium 03/11/2016 141  137 - 147 mmol/L Final   #1-throat pain-it appears she has thrush Will treat with nystatin swish and swallow 5 mL 4 times a day for 7 days-also will give oral Diflucan 100 mg daily for 3 days continue to monitor for any changes.  #2 regards confusion her white count on lab done on January 6 was reassuring-at this point will monitor she is at her baseline today pleasant cooperative somewhat confused but not more so than usual this will have to be watched consider possibly psychiatric consult if this reoccurs.  Also will monitor with vital signs pulse ox use it for 72 hours to keep an eye on her throat issues as well as confusion issues.  In regards to thrombocytopenia-platelets appear fairly baseline at 102,000 will update this next week-in regards to renal insufficiency creatinine of 1.33 appears relatively baseline as well will update this as well.  [id number]         [id number]

## 2016-06-22 ENCOUNTER — Encounter: Payer: Self-pay | Admitting: Adult Health

## 2016-07-12 ENCOUNTER — Non-Acute Institutional Stay (SKILLED_NURSING_FACILITY): Payer: Medicare Other | Admitting: Adult Health

## 2016-07-12 ENCOUNTER — Encounter: Payer: Self-pay | Admitting: Adult Health

## 2016-07-12 DIAGNOSIS — I5032 Chronic diastolic (congestive) heart failure: Secondary | ICD-10-CM | POA: Diagnosis not present

## 2016-07-12 DIAGNOSIS — N183 Chronic kidney disease, stage 3 unspecified: Secondary | ICD-10-CM

## 2016-07-12 DIAGNOSIS — I482 Chronic atrial fibrillation, unspecified: Secondary | ICD-10-CM

## 2016-07-12 DIAGNOSIS — K562 Volvulus: Secondary | ICD-10-CM

## 2016-07-12 DIAGNOSIS — K219 Gastro-esophageal reflux disease without esophagitis: Secondary | ICD-10-CM

## 2016-07-12 DIAGNOSIS — J9611 Chronic respiratory failure with hypoxia: Secondary | ICD-10-CM

## 2016-07-12 DIAGNOSIS — I110 Hypertensive heart disease with heart failure: Secondary | ICD-10-CM | POA: Diagnosis not present

## 2016-07-12 DIAGNOSIS — F0280 Dementia in other diseases classified elsewhere without behavioral disturbance: Secondary | ICD-10-CM | POA: Diagnosis not present

## 2016-07-12 DIAGNOSIS — D638 Anemia in other chronic diseases classified elsewhere: Secondary | ICD-10-CM | POA: Diagnosis not present

## 2016-07-12 NOTE — Progress Notes (Signed)
Location:  [facility] Room Number: [id number] A Place of Service:  SNF (31)   CODE STATUS: DNR  Allergies  Allergen Reactions  . Aspirin Other (See Comments)    [name]    Chief Complaint  Patient presents with  . Medical Management of Chronic Issues    Routine Visit    HPI:  She is a long term resident of this facility being seen for the management of her chronic illnesses. She is complaining of epigastric pain and indigestion. She is getting out bed daily. There are no nursing concerns at this time.   Past Medical History:  Diagnosis Date  . Altered mental status   . Anemia   . Atrial fibrillation ([place])   . CKD (chronic kidney disease) stage 3, GFR 30-59 ml/min 02/27/2015  . Constipation 02/27/2015  . Coronary artery disease   . DEMENTIA   . Depression   . Diabetes mellitus   . Edema of lower extremity 07/13/11   right leg more swollen than left leg  . Encephalopathy   . Enlarged heart   . Esophageal dysmotility 07/02/12  . GERD (gastroesophageal reflux disease)   . History of adenomatous polyp of colon 06/24/99  . Horseshoe kidney   . Hyperlipemia   . Hypertension   . Pancreatic lesion 05/22/11   no further workup  per PCP/family due to age  . Parkinson disease ([place])   . Peripheral neuropathy ([place])   . Sigmoid volvulus ([place]) 05/24/2013  . Thrombophlebitis   . TIA (transient ischemic attack) 12/19/2012    Past Surgical History:  Procedure Laterality Date  . ABDOMINAL HYSTERECTOMY    . BOWEL DECOMPRESSION N/A 04/18/2016   Procedure: BOWEL DECOMPRESSION;  Surgeon: [name], MD;  Location: [facility];  Service: Endoscopy;  Laterality: N/A;  . BREAST SURGERY     2 benign tumors removed left breast  . CHOLECYSTECTOMY    . ESOPHAGEAL DILATION     several times by Dr. [name]  . ESOPHAGOGASTRODUODENOSCOPY (EGD) WITH ESOPHAGEAL DILATION N/A 08/01/2012   Procedure: ESOPHAGOGASTRODUODENOSCOPY (EGD) WITH ESOPHAGEAL DILATION;  Surgeon: [name], MD;  Location: [facility];  Service: Endoscopy;  Laterality: N/A;  with c-arm savory dilators  . ESOPHAGOGASTRODUODENOSCOPY (EGD) WITH PROPOFOL N/A 03/12/2015   Procedure: ESOPHAGOGASTRODUODENOSCOPY (EGD) WITH PROPOFOL;  Surgeon: [name], MD;  Location: [facility];  Service: Endoscopy;  Laterality: N/A;  . FLEXIBLE SIGMOIDOSCOPY N/A 05/24/2013   Procedure: FLEXIBLE SIGMOIDOSCOPY;  Surgeon: [name], MD;  Location: [facility];  Service: Endoscopy;  Laterality: N/A;  . FLEXIBLE SIGMOIDOSCOPY N/A 05/26/2013   Procedure:  flex with decompression of sigmoid volvulus;  Surgeon: [name], MD;  Location: [facility];  Service: Endoscopy;  Laterality: N/A;  . FLEXIBLE SIGMOIDOSCOPY N/A 12/20/2015   Procedure: FLEXIBLE SIGMOIDOSCOPY;  Surgeon: Milus Banister, MD;  Location: [facility];  Service: Endoscopy;  Laterality: N/A;  . FLEXIBLE SIGMOIDOSCOPY N/A 04/17/2016   Procedure: FLEXIBLE SIGMOIDOSCOPY;  Surgeon: Ladene Artist, MD;  Location: [facility];  Service: Endoscopy;  Laterality: N/A;  . FLEXIBLE SIGMOIDOSCOPY N/A 04/18/2016   Procedure: FLEXIBLE SIGMOIDOSCOPY;  Surgeon: [name], MD;  Location: [facility];  Service: Endoscopy;  Laterality: N/A;  . FOOT SURGERY     benign tumors from foot  . NOSE SURGERY    . SHOULDER SURGERY  2001   left clavicle excision and acromioplasty  . TOTAL HIP ARTHROPLASTY      Social History   Social History  .  Marital status: Widowed    Spouse name: N/A  . Number of children: 5  . Years of education: 10th   Occupational History  . Retired    Social History Main Topics  . Smoking status: Former Smoker    Quit date: 10/09/1964  . Smokeless tobacco: Never Used  . Alcohol use No  . Drug use: No  . Sexual activity: No   Other Topics Concern  . Not on file   Social History Narrative   Pt lives at [facility] and [place].   Caffeine Use: very small amount daily   Family History  Problem Relation Age of  Onset  . Cancer    . Heart disease        VITAL SIGNS BP (!) 125/46   Pulse (!) 58   Temp 97.4 F (36.3 C)   Resp 18   Ht 5\' 7"  (1.702 m)   Wt 153 lb 6.4 oz (69.6 kg)   SpO2 98%   BMI 24.03 kg/m   Patient's Medications  New Prescriptions   No medications on file  Previous Medications   AMLODIPINE (NORVASC) 2.5 MG TABLET    Take 2.5 mg by mouth every morning. Hold for BP < 100/60 or HR < 60   ASPIRIN EC 81 MG TABLET    Take 81 mg by mouth daily.   BACLOFEN (LIORESAL) 10 MG TABLET    Take 5 mg by mouth at bedtime.   BENZOCAINE-MENTHOL (CHLORASEPTIC) 6-10 MG LOZENGE    Take 1 lozenge by mouth as needed for sore throat.   BIOTIN 5 MG/ML LIQD    Take 5 mLs by mouth 2 (two) times daily.    CARVEDILOL (COREG) 3.125 MG TABLET    Take 3.125 mg by mouth daily.   CHOLECALCIFEROL (VITAMIN D) 1000 UNITS TABLET    Take 1,000 Units by mouth every morning.    CRANBERRY 475 MG CAPS    Take 1 capsule by mouth 2 (two) times daily.    DOCUSATE SODIUM (COLACE) 100 MG CAPSULE    Take 200 mg by mouth at bedtime.    DORZOLAMIDE (TRUSOPT) 2 % OPHTHALMIC SOLUTION    Place 1 drop into both eyes 3 (three) times daily. Reported on 11/16/2015        0800,1400,2000   ESCITALOPRAM (LEXAPRO) 10 MG TABLET    Take 10 mg by mouth daily.   FEEDING SUPPLEMENT, ENSURE ENLIVE, (ENSURE ENLIVE) LIQD    Take 237 mLs by mouth 2 (two) times daily between meals.   FERROUS SULFATE 325 (65 FE) MG TABLET    Take 325 mg by mouth daily with breakfast.   FLUTICASONE (FLONASE) 50 MCG/ACT NASAL SPRAY    Place 2 sprays into both nostrils at bedtime.   HYDROCODONE-ACETAMINOPHEN (NORCO/VICODIN) 5-325 MG TABLET    Take 1-2 tablets by mouth every 4 (four) hours as needed for moderate pain.   IPRATROPIUM-ALBUTEROL (DUONEB) 0.5-2.5 (3) MG/3ML SOLN    Take 3 mLs by nebulization daily as needed.   LIDOCAINE (LIDODERM) 5 %    Place 1 patch onto the skin daily. Apply 1 patch to left lower back/hip every morning.  Remove & Discard patch within  12 hours or as directed by MD   LINACLOTIDE (LINZESS) 145 MCG CAPS CAPSULE    Take 145 mcg by mouth daily before breakfast.    LORATADINE (CLARITIN) 10 MG TABLET    Take 10 mg by mouth daily before breakfast.    MULTIPLE VITAMINS-MINERALS (CERTAGEN PO)  Take 1 tablet by mouth daily.    ONDANSETRON (ZOFRAN) 8 MG TABLET    Take 8 mg by mouth every 8 (eight) hours as needed for nausea or vomiting.   OXYGEN    Inhale 2 L into the lungs continuous.   POLYETHYLENE GLYCOL (MIRALAX / GLYCOLAX) PACKET    Take 17 g by mouth daily at 6 (six) AM.    POTASSIUM CHLORIDE ER 20 MEQ TBCR    Take 40 mEq by mouth daily.   PROTONIX 40 MG PACK    Take 40 mg by mouth daily at 6 (six) AM.   SENNA (SENOKOT) 8.6 MG TABS TABLET    Take 1 tablet (8.6 mg total) by mouth 2 (two) times daily.   SKIN PROTECTANTS, MISC. (CALAZIME SKIN PROTECTANT EX)    Apply 1 application topically daily. Buttocks protection   SODIUM CHLORIDE (OCEAN) 0.65 % SOLN NASAL SPRAY    Place 1 spray into both nostrils as directed. Four times daily And every 24 hours as needed to moisturize nasal passages.   TORSEMIDE (DEMADEX) 20 MG TABLET    Take 20 mg by mouth daily.   TRAVOPROST, BAK FREE, (TRAVATAN) 0.004 % SOLN OPHTHALMIC SOLUTION    Place 1 drop into both eyes daily at 8 pm.    VITAMIN B-12 (CYANOCOBALAMIN) 500 MCG TABLET    Take 500 mcg by mouth every morning.   Modified Medications   No medications on file  Discontinued Medications   No medications on file     SIGNIFICANT DIAGNOSTIC EXAMS   02-15-15: abdominal ultrasound: 1. Gallbladder not visualized. 2. No hydronephrosis no renal calculus; 3. The pancreas not well visualized; which could be related to gassy abdomen but an appearance which might represent pancreatitis in the appropriate clinical setting.   03-12-15: upper endoscopy: duodenal bulb 1.5-2.0 cm ulcer with clot at base. No active bleeding. Surrounding mucosa was edematous. Biopsy of the surrounding mucosa were done.    12-20-15: ct of abdomen and pelvis: 1. Loosely twisted sigmoid volvulus with transition point. Proximal distention is improved compared to radiography earlier today suggesting partial interval decompression. 2. Chronic findings are stable and described above.  12-20-15: flex sigmoidoscopy: chronic intermittent sigmoid volvulus. The site of volvulus was only slightly twisted during this exam, easily passed with colonoscope and the air filled dilated colon proximal to it was suctioned extensively. Her abdomen was flat, normal after the exam.   02-19-16: right lower extremity doppler: neg dvt  02-23-16: renal ultrasound: normal right kidney slightly small left kidney. Right 1.9 cm cyst no hydronephrosis or stones   03-19-16: chest x-ray: modest cardiomegaly with mild pulmonary venous congestion    LABS REVIEWED:     10-14-15: wbc 5.0; hgb 11.5; hct 33.9; mcv 101.2; plt 127; glucose 132; bun 15.8; creat 1.27; k+ 3.8; na++ 143; liver normal albumin 4.0  12-20-15: wbc 5.2; hgb 11.0; hct 32.4; mcv 101.3; plt 107; glucose 138; bun 31; creat 1.61; k+ 3.2; na++ 138; liver normal albumin 3.9; urine culture: e-coli: rocephin 12-21-15: wbc 3.9; hgb 9.8; hct 29.7; mcv 103.1; plt 98; glucose 108; bun 23; creat 1.15; k+ 3.2; na++ 143; mag 2.4 12-22-15: wbc 5.1; hgb 10.8; hct 32.3; mcv 103.2; plt 101; glucose 125; bun 18; creat 1.09; k+ 3.9; na++ 142  01-04-16: wbc 4.1; hgb 10.9; hct 32.4; mcv 100.5; plt 175; glucose 121; bun 13.2; creat 1.18; k+ 3.8; na++ 144; liver normal albumin 3.8 01-31-16; urine culture: e-coli: ESBL invanz 02-15-16: wbc 4.6; hgb 11.3; hct 36.3;  mcv 102.7; plt 128; glucose 160; bun 15.3; creat 1.65; k+ 4.5; na++ 143; liver normal albumin 3.9 02-19-16: glucose 116; bun 28.7; mcv 1.72; k+ 4.5; na++ 143 02-23-16; wbc 3.5 hgb 9.7; hct 30.4; mcv 101.8; plt 126; glucose 118; bun 25.6; creat 1.52; k+ 4.6; na++ 144  03-11-16: glucose 196; bun 18.4; creat 1.28; k+ 3.7; na++ 141  03-19-16: A/B neg for  influenza 04-01-16: glucose 139; bun 15.7; creat 1.16; k+ 3.1; na++ 144 04-07-16: glucose 140; bun 18.2; creat 1.37; k+ 4.1; na++ 143  04-17-16: wbc 5.0; hgb 11.1; hct 33.3; mcv 100.6; plt 119; glucose 178; bun 19; creat 1.22; k+ 3.7; na++ 141; liver normal albumin 3.8 04-18-16: wbc 4.0; hgb 9.3; hct 28.8; mcv 101.1; plt 93; glucose 124; bun 17; creat 1.20; k+ 2.9; na++ 141; liver normal albumin 3.2; mag 1.7 phos 3.7; tsh 2.572; hgb a1c 6.9 04-20-16: wbc 5.5; hgb 10.2; hct 30.9; mcv 100.0; plt 97; glucose 103; bun 14; create 1.00; k+ 3.7; na++ 143; mag 1.8  04-24-16:glucose 132; bun 17; creat 1.05; k+ 4.3; na++ 138     Review of Systems  Constitutional: Negative for appetite change and fatigue.  HENT: Negative for congestion.   Respiratory: Negative for cough, chest tightness and shortness of breath.   Cardiovascular: Negative for chest pain, palpitations and leg swelling.  Gastrointestinal: Negative for nausea, abdominal pain, diarrhea and constipation.  Musculoskeletal: no complaint of muscle or joint pain    Skin: Negative for pallor.  Neurological: Negative for dizziness.  Psychiatric/Behavioral: The patient is not nervous/anxious.       Physical Exam  Constitutional: No distress.  Eyes: Conjunctivae are normal.  Neck: Neck supple. No JVD present. No thyromegaly present.  Cardiovascular: Normal rate, regular rhythm and intact distal pulses.   Respiratory: Effort normal and breath sounds normal. No respiratory distress. She has no wheezes.  Is 02 dependent  GI: Soft. Bowel sounds hypoactive; distended no tenderness  Musculoskeletal: She exhibits no edema.  Able to move all extremities .   Lymphadenopathy:    She has no cervical adenopathy.  Neurological: She is alert.  Skin: Skin is warm and dry. She is not diaphoretic.  Psychiatric: She has a normal mood and affect.     ASSESSMENT/ PLAN:  1. Hypertension: will continue norvasc 2.5 mg daily coreg 3.15 mg daily   2.  Chronic diastolic heart failure: is presently not on diuretic  Will not make changes will monitor   3. Duodenal ulcer: will continue protonix 40 mg  daily  Has zofran 8 mg every 8 hours as needed  Will begin pepcid 20 mg nightly to help with gerd symptoms and to avoid using PPI twice daily   4. UTI: will continue cranberry daily      5. Anemia: will continue iron daily hgb is 10.2  6. Chronic afib: will continue coreg 3.125 mg daily for rate control. Will continue asa 81mg  daily .    7. Chronic respiratory failure: is 02 dependent. Has duoneb every 6 hours as needed   8. Allergic rhinitis: will continue flonase nightly and claritin 10 mg daily   9. Chronic pain due to osteoarthritis: will continue lidoderm to lower back daily; will continue baclofen 5 mg nightly for leg spasticity has vicodin 5/325 mg 1 or 2 tabs every 4 hours as needed for pain   10. Depression: is stable receives benefit form lexapro 10 mg daily   11. Chronic constipation  Has recurrent sigmoid volvulus: will continue colace 200 mg  daily linzess 145 mcg daily; senna twice daily miralax daily   Her family has decided upon conservative treatment including repeat decompression by endoscope if needed.   12. Glaucoma: will continue  trusopt to both eyes three times daily; travatan to both eyes nightly   13. CKD stage III: bun/creat 17/1.05   14. Hypokalemia: k+ 4.3; will continue k+ 40 meq daily   EKG and hgb a1c    MD is aware of resident's narcotic use and is in agreement with current plan of care. We will attempt to wean resident as apropriate   Ok Edwards NP [facility]  Contact [phone] Monday through Friday 8am- 5pm  After hours call [phone]

## 2016-08-11 ENCOUNTER — Non-Acute Institutional Stay (SKILLED_NURSING_FACILITY): Payer: Medicare Other | Admitting: Adult Health

## 2016-08-11 ENCOUNTER — Encounter: Payer: Self-pay | Admitting: Adult Health

## 2016-08-11 DIAGNOSIS — I482 Chronic atrial fibrillation, unspecified: Secondary | ICD-10-CM

## 2016-08-11 DIAGNOSIS — K219 Gastro-esophageal reflux disease without esophagitis: Secondary | ICD-10-CM

## 2016-08-11 DIAGNOSIS — D508 Other iron deficiency anemias: Secondary | ICD-10-CM | POA: Diagnosis not present

## 2016-08-11 DIAGNOSIS — K5901 Slow transit constipation: Secondary | ICD-10-CM

## 2016-08-11 DIAGNOSIS — J9611 Chronic respiratory failure with hypoxia: Secondary | ICD-10-CM | POA: Diagnosis not present

## 2016-08-11 DIAGNOSIS — I5032 Chronic diastolic (congestive) heart failure: Secondary | ICD-10-CM | POA: Diagnosis not present

## 2016-08-11 DIAGNOSIS — F0280 Dementia in other diseases classified elsewhere without behavioral disturbance: Secondary | ICD-10-CM | POA: Diagnosis not present

## 2016-08-11 DIAGNOSIS — K562 Volvulus: Secondary | ICD-10-CM

## 2016-08-11 DIAGNOSIS — I110 Hypertensive heart disease with heart failure: Secondary | ICD-10-CM | POA: Diagnosis not present

## 2016-08-11 DIAGNOSIS — N183 Chronic kidney disease, stage 3 unspecified: Secondary | ICD-10-CM

## 2016-08-11 NOTE — Progress Notes (Signed)
Location:   [facility] Room Number: 789 A Place of Service:  SNF (31)   CODE STATUS: DNR  Allergies  Allergen Reactions  . Aspirin Other (See Comments)    [name] only    Chief Complaint  Patient presents with  . Medical Management of Chronic Issues    Routine Visit    HPI:  [facility] is a long term resident of this facility being seen for the management of [facility] chronic illnesses. [facility] complaining of left ear pain. [facility] does complain of right headache.    Past Medical History:  Diagnosis Date  . Altered mental status   . Anemia   . Atrial fibrillation ([place])   . CKD (chronic kidney disease) stage 3, GFR 30-59 ml/min 02/27/2015  . Constipation 02/27/2015  . Coronary artery disease   . DEMENTIA   . Depression   . Diabetes mellitus   . Edema of lower extremity 07/13/11   right leg more swollen than left leg  . Encephalopathy   . Enlarged heart   . Esophageal dysmotility 07/02/12  . GERD (gastroesophageal reflux disease)   . History of adenomatous polyp of colon 06/24/99  . Horseshoe kidney   . Hyperlipemia   . Hypertension   . Pancreatic lesion 05/22/11   no further workup  per PCP/family due to age  . Parkinson disease ([place])   . Peripheral neuropathy ([place])   . Sigmoid volvulus ([place]) 05/24/2013  . Thrombophlebitis   . TIA (transient ischemic attack) 12/19/2012    Past Surgical History:  Procedure Laterality Date  . ABDOMINAL HYSTERECTOMY    . BOWEL DECOMPRESSION N/A 04/18/2016   Procedure: BOWEL DECOMPRESSION;  Surgeon: [name], MD;  Location: [facility];  Service: Endoscopy;  Laterality: N/A;  . BREAST SURGERY     2 benign tumors removed left breast  . CHOLECYSTECTOMY    . ESOPHAGEAL DILATION     several times by Dr. [name]  . ESOPHAGOGASTRODUODENOSCOPY (EGD) WITH ESOPHAGEAL DILATION N/A 08/01/2012   Procedure: ESOPHAGOGASTRODUODENOSCOPY (EGD) WITH ESOPHAGEAL DILATION;  Surgeon: [name], MD;  Location: [facility];  Service: Endoscopy;   Laterality: N/A;  with c-arm savory dilators  . ESOPHAGOGASTRODUODENOSCOPY (EGD) WITH PROPOFOL N/A 03/12/2015   Procedure: ESOPHAGOGASTRODUODENOSCOPY (EGD) WITH PROPOFOL;  Surgeon: [name], MD;  Location: [facility];  Service: Endoscopy;  Laterality: N/A;  . FLEXIBLE SIGMOIDOSCOPY N/A 05/24/2013   Procedure: FLEXIBLE SIGMOIDOSCOPY;  Surgeon: [name], MD;  Location: [facility];  Service: Endoscopy;  Laterality: N/A;  . FLEXIBLE SIGMOIDOSCOPY N/A 05/26/2013   Procedure:  flex with decompression of sigmoid volvulus;  Surgeon: [name], MD;  Location: [facility];  Service: Endoscopy;  Laterality: N/A;  . FLEXIBLE SIGMOIDOSCOPY N/A 12/20/2015   Procedure: FLEXIBLE SIGMOIDOSCOPY;  Surgeon: Milus Banister, MD;  Location: [facility];  Service: Endoscopy;  Laterality: N/A;  . FLEXIBLE SIGMOIDOSCOPY N/A 04/17/2016   Procedure: FLEXIBLE SIGMOIDOSCOPY;  Surgeon: Ladene Artist, MD;  Location: [facility];  Service: Endoscopy;  Laterality: N/A;  . FLEXIBLE SIGMOIDOSCOPY N/A 04/18/2016   Procedure: FLEXIBLE SIGMOIDOSCOPY;  Surgeon: [name], MD;  Location: [facility];  Service: Endoscopy;  Laterality: N/A;  . FOOT SURGERY     benign tumors from foot  . NOSE SURGERY    . SHOULDER SURGERY  2001   left clavicle excision and acromioplasty  . TOTAL HIP ARTHROPLASTY      Social History   Social History  . Marital status: Widowed    Spouse  name: N/A  . Number of children: 5  . Years of education: 10th   Occupational History  . Retired    Social History Main Topics  . Smoking status: Former Smoker    Quit date: 10/09/1964  . Smokeless tobacco: Never Used  . Alcohol use No  . Drug use: No  . Sexual activity: No   Other Topics Concern  . Not on file   Social History Narrative   [facility] lives at [facility] and [place].   Caffeine Use: very small amount daily   Family History  Problem Relation Age of Onset  . Cancer    . Heart disease        VITAL  SIGNS BP 118/62   Pulse 60   Temp 97.4 F (36.3 C)   Resp 18   Ht 5\' 7"  (1.702 m)   Wt 152 lb 9.6 oz (69.2 kg)   SpO2 99%   BMI 23.90 kg/m   Patient's Medications  New Prescriptions   No medications on file  Previous Medications   AMLODIPINE (NORVASC) 2.5 MG TABLET    Take 2.5 mg by mouth every morning. Hold for BP < 100/60 or HR < 60   ASPIRIN EC 81 MG TABLET    Take 81 mg by mouth daily.   BACLOFEN (LIORESAL) 10 MG TABLET    Take 5 mg by mouth at bedtime.   BENZOCAINE-MENTHOL (CHLORASEPTIC) 6-10 MG LOZENGE    Take 1 lozenge by mouth as needed for sore throat.   BIOTIN 5 MG/ML LIQD    Take 5 mLs by mouth 2 (two) times daily.    CARVEDILOL (COREG) 3.125 MG TABLET    Take 3.125 mg by mouth daily.   CHOLECALCIFEROL (VITAMIN D) 1000 UNITS TABLET    Take 1,000 Units by mouth every morning.    CRANBERRY 475 MG CAPS    Take 1 capsule by mouth 2 (two) times daily.    DOCUSATE SODIUM (COLACE) 100 MG CAPSULE    Take 200 mg by mouth at bedtime.    DORZOLAMIDE (TRUSOPT) 2 % OPHTHALMIC SOLUTION    Place 1 drop into both eyes 3 (three) times daily. Reported on 11/16/2015        0800,1400,2000   ESCITALOPRAM (LEXAPRO) 10 MG TABLET    Take 10 mg by mouth daily.   FAMOTIDINE (PEPCID) 20 MG TABLET    Take 20 mg by mouth at bedtime.   FEEDING SUPPLEMENT, ENSURE ENLIVE, (ENSURE ENLIVE) LIQD    Take 237 mLs by mouth 2 (two) times daily between meals.   FERROUS SULFATE 325 (65 FE) MG TABLET    Take 325 mg by mouth daily with breakfast.   FLUTICASONE (FLONASE) 50 MCG/ACT NASAL SPRAY    Place 2 sprays into both nostrils at bedtime.   HYDROCODONE-ACETAMINOPHEN (NORCO/VICODIN) 5-325 MG TABLET    Take 1-2 tablets by mouth every 4 (four) hours as needed for moderate pain.   IPRATROPIUM-ALBUTEROL (DUONEB) 0.5-2.5 (3) MG/3ML SOLN    Take 3 mLs by nebulization daily as needed.   LIDOCAINE (LIDODERM) 5 %    Place 1 patch onto the skin daily. Apply 1 patch to left lower back/hip every morning.  Remove & Discard  patch within 12 hours or as directed by MD   LINACLOTIDE (LINZESS) 145 MCG CAPS CAPSULE    Take 145 mcg by mouth daily before breakfast.    LORATADINE (CLARITIN) 10 MG TABLET    Take 10 mg by mouth daily before breakfast.  MULTIPLE VITAMINS-MINERALS (CERTAGEN PO)    Take 1 tablet by mouth daily.    ONDANSETRON (ZOFRAN) 8 MG TABLET    Take 8 mg by mouth every 8 (eight) hours as needed for nausea or vomiting.   OXYGEN    Inhale 2 L into the lungs continuous.   POLYETHYLENE GLYCOL (MIRALAX / GLYCOLAX) PACKET    Take 17 g by mouth daily at 6 (six) AM.    POTASSIUM CHLORIDE ER 20 MEQ TBCR    Take 40 mEq by mouth daily.   PROTONIX 40 MG PACK    Take 40 mg by mouth daily at 6 (six) AM.   SENNA (SENOKOT) 8.6 MG TABS TABLET    Take 1 tablet (8.6 mg total) by mouth 2 (two) times daily.   SKIN PROTECTANTS, MISC. (CALAZIME SKIN PROTECTANT EX)    Apply 1 application topically daily. Buttocks protection   SODIUM CHLORIDE (OCEAN) 0.65 % SOLN NASAL SPRAY    Place 1 spray into both nostrils as directed. Four times daily And every 24 hours as needed to moisturize nasal passages.   TORSEMIDE (DEMADEX) 20 MG TABLET    Take 20 mg by mouth daily.   TRAVOPROST, BAK FREE, (TRAVATAN) 0.004 % SOLN OPHTHALMIC SOLUTION    Place 1 drop into both eyes daily at 8 pm.    VITAMIN B-12 (CYANOCOBALAMIN) 500 MCG TABLET    Take 500 mcg by mouth every morning.   Modified Medications   No medications on file  Discontinued Medications   No medications on file     SIGNIFICANT DIAGNOSTIC EXAMS  02-15-15: abdominal ultrasound: 1. Gallbladder not visualized. 2. No hydronephrosis no renal calculus; 3. The pancreas not well visualized; which could be related to gassy abdomen but an appearance which might represent pancreatitis in the appropriate clinical setting.   03-12-15: upper endoscopy: duodenal bulb 1.5-2.0 cm ulcer with clot at base. No active bleeding. Surrounding mucosa was edematous. Biopsy of the surrounding mucosa were  done.   12-20-15: ct of abdomen and pelvis: 1. Loosely twisted sigmoid volvulus with transition point. Proximal distention is improved compared to radiography earlier today suggesting partial interval decompression. 2. Chronic findings are stable and described above.  12-20-15: flex sigmoidoscopy: chronic intermittent sigmoid volvulus. The site of volvulus was only slightly twisted during this exam, easily passed with colonoscope and the air filled dilated colon proximal to it was suctioned extensively. [facility] abdomen was flat, normal after the exam.   02-19-16: right lower extremity doppler: neg dvt  02-23-16: renal ultrasound: normal right kidney slightly small left kidney. Right 1.9 cm cyst no hydronephrosis or stones   03-19-16: chest x-ray: modest cardiomegaly with mild pulmonary venous congestion    LABS REVIEWED:     10-14-15: wbc 5.0; hgb 11.5; hct 33.9; mcv 101.2; plt 127; glucose 132; bun 15.8; creat 1.27; k+ 3.8; na++ 143; liver normal albumin 4.0  12-20-15: wbc 5.2; hgb 11.0; hct 32.4; mcv 101.3; plt 107; glucose 138; bun 31; creat 1.61; k+ 3.2; na++ 138; liver normal albumin 3.9; urine culture: e-coli: rocephin 12-21-15: wbc 3.9; hgb 9.8; hct 29.7; mcv 103.1; plt 98; glucose 108; bun 23; creat 1.15; k+ 3.2; na++ 143; mag 2.4 12-22-15: wbc 5.1; hgb 10.8; hct 32.3; mcv 103.2; plt 101; glucose 125; bun 18; creat 1.09; k+ 3.9; na++ 142  01-04-16: wbc 4.1; hgb 10.9; hct 32.4; mcv 100.5; plt 175; glucose 121; bun 13.2; creat 1.18; k+ 3.8; na++ 144; liver normal albumin 3.8 01-31-16; urine culture: e-coli: ESBL invanz 02-15-16:  wbc 4.6; hgb 11.3; hct 36.3; mcv 102.7; plt 128; glucose 160; bun 15.3; creat 1.65; k+ 4.5; na++ 143; liver normal albumin 3.9 02-19-16: glucose 116; bun 28.7; mcv 1.72; k+ 4.5; na++ 143 02-23-16; wbc 3.5 hgb 9.7; hct 30.4; mcv 101.8; plt 126; glucose 118; bun 25.6; creat 1.52; k+ 4.6; na++ 144  03-11-16: glucose 196; bun 18.4; creat 1.28; k+ 3.7; na++ 141  03-19-16: A/B neg for  influenza 04-01-16: glucose 139; bun 15.7; creat 1.16; k+ 3.1; na++ 144 04-07-16: glucose 140; bun 18.2; creat 1.37; k+ 4.1; na++ 143  04-17-16: wbc 5.0; hgb 11.1; hct 33.3; mcv 100.6; plt 119; glucose 178; bun 19; creat 1.22; k+ 3.7; na++ 141; liver normal albumin 3.8 04-18-16: wbc 4.0; hgb 9.3; hct 28.8; mcv 101.1; plt 93; glucose 124; bun 17; creat 1.20; k+ 2.9; na++ 141; liver normal albumin 3.2; mag 1.7 phos 3.7; tsh 2.572; hgb a1c 6.9 04-20-16: wbc 5.5; hgb 10.2; hct 30.9; mcv 100.0; plt 97; glucose 103; bun 14; create 1.00; k+ 3.7; na++ 143; mag 1.8  04-24-16:glucose 132; bun 17; creat 1.05; k+ 4.3; na++ 138     Review of Systems  Constitutional: Negative for appetite change and fatigue.  HENT: Negative for congestion.  has left ear pain  Respiratory: Negative for cough, chest tightness and shortness of breath.   Cardiovascular: Negative for chest pain, palpitations and leg swelling.  Gastrointestinal: Negative for nausea, abdominal pain, diarrhea and constipation.  Musculoskeletal: no complaint of muscle or joint pain    Skin: Negative for pallor.  Neurological: Negative for dizziness. has headache  Psychiatric/Behavioral: The patient is not nervous/anxious.       Physical Exam  Constitutional: No distress.  Eyes: Conjunctivae are normal.  Right ear with ear wax present Left ear: questionable hole in tympanic membrane  Neck: Neck supple. No JVD present. No thyromegaly present.  Cardiovascular: Normal rate, regular rhythm and intact distal pulses.   Respiratory: Effort normal and breath sounds normal. No respiratory distress. [facility] has no wheezes.  Is 02 dependent  GI: Soft. Bowel sounds hypoactive; distended no tenderness  Musculoskeletal: [facility] exhibits no edema.  Able to move all extremities .   Lymphadenopathy:    [facility] has no cervical adenopathy.  Neurological: [facility] is alert.  Skin: Skin is warm and dry. [facility] is not diaphoretic.  Psychiatric: [facility] has a normal mood and  affect.     ASSESSMENT/ PLAN:  1. Hypertension: will continue norvasc 2.5 mg daily coreg 3.15 mg daily   2. Chronic diastolic heart failure: is presently not on diuretic  Will not make changes will monitor   3. Duodenal ulcer: will continue protonix 40 mg  daily  Has zofran 8 mg every 8 hours as needed  Will begin pepcid 20 mg nightly to help with gerd symptoms and to avoid using PPI twice daily   4. UTI: will continue cranberry daily      5. Anemia: will continue iron daily hgb is 10.2  6. Chronic afib: will continue coreg 3.125 mg daily for rate control. Will continue asa 81mg  daily .    7. Chronic respiratory failure: is 02 dependent. Has duoneb every 6 hours as needed   8. Allergic rhinitis: will continue flonase nightly and claritin 10 mg daily   9. Chronic pain due to osteoarthritis: will continue lidoderm to lower back daily; will continue baclofen 5 mg nightly for leg spasticity has vicodin 5/325 mg 1 or 2 tabs every 4 hours as needed for pain  10. Depression: is stable receives benefit form lexapro 10 mg daily   11. Chronic constipation  Has recurrent sigmoid volvulus: will continue colace 200 mg daily linzess 145 mcg daily; senna twice daily miralax daily   [facility] family has decided upon conservative treatment including repeat decompression by endoscope if needed.   12. Glaucoma: will continue  trusopt to both eyes three times daily; travatan to both eyes nightly   13. CKD stage III: bun/creat 17/1.05   14. Hypokalemia: k+ 4.3; will continue k+ 40 meq daily  Will check cbc; cmp; sed rate Will get ENT consult for left ear and will use debrox on right ear    MD is aware of resident's narcotic use and is in agreement with current plan of care. We will attempt to wean resident as apropriate     Ok Edwards NP [facility]  Contact [phone] Monday through Friday 8am- 5pm  After hours call [phone]

## 2016-08-12 LAB — BASIC METABOLIC PANEL
BUN: 33 mg/dL — AB (ref 4–21)
CREATININE: 1.6 mg/dL — AB (ref 0.5–1.1)
GLUCOSE: 111 mg/dL
Potassium: 4.4 mmol/L (ref 3.4–5.3)
SODIUM: 143 mmol/L (ref 137–147)

## 2016-08-12 LAB — CBC AND DIFFERENTIAL
HCT: 32 % — AB (ref 36–46)
Hemoglobin: 10.7 g/dL — AB (ref 12.0–16.0)
Platelets: 95 10*3/uL — AB (ref 150–399)
WBC: 4 10^3/mL

## 2016-08-12 LAB — HEPATIC FUNCTION PANEL
ALK PHOS: 90 U/L (ref 25–125)
ALT: 7 U/L (ref 7–35)
AST: 12 U/L — AB (ref 13–35)
BILIRUBIN, TOTAL: 0.3 mg/dL

## 2016-08-12 LAB — POCT ERYTHROCYTE SEDIMENTATION RATE, NON-AUTOMATED: Sed Rate: 10 mm

## 2016-08-16 ENCOUNTER — Encounter: Payer: Self-pay | Admitting: Adult Health

## 2016-08-16 ENCOUNTER — Non-Acute Institutional Stay (SKILLED_NURSING_FACILITY): Payer: Medicare Other | Admitting: Adult Health

## 2016-08-16 DIAGNOSIS — G8929 Other chronic pain: Secondary | ICD-10-CM

## 2016-08-16 DIAGNOSIS — M150 Primary generalized (osteo)arthritis: Secondary | ICD-10-CM

## 2016-08-16 DIAGNOSIS — M159 Polyosteoarthritis, unspecified: Secondary | ICD-10-CM

## 2016-08-16 DIAGNOSIS — E876 Hypokalemia: Secondary | ICD-10-CM

## 2016-08-16 NOTE — Progress Notes (Signed)
Location:   [facility] Room Number: 440 A Place of Service:  SNF (31)   CODE STATUS: DNR  Allergies  Allergen Reactions  . Aspirin Other (See Comments)    [name]    Chief Complaint  Patient presents with  . Acute Visit    Medication Management    HPI:  Her son [name] has called and spoken with me regarding her medicaiton regimen. He feels as though she is over medicated and this is causing her to have periods of confusion. He is also worried about her k+ level. He tells me that she requires a higher k+ level than the average person. I did inform him that an abnormally high k+ can cause heart issues. He would like her k+ monitored on a monthly bases. We will also lower her vicodin.    Past Medical History:  Diagnosis Date  . Altered mental status   . Anemia   . Atrial fibrillation ([place])   . CKD (chronic kidney disease) stage 3, GFR 30-59 ml/min 02/27/2015  . Constipation 02/27/2015  . Coronary artery disease   . DEMENTIA   . Depression   . Diabetes mellitus   . Edema of lower extremity 07/13/11   right leg more swollen than left leg  . Encephalopathy   . Enlarged heart   . Esophageal dysmotility 07/02/12  . GERD (gastroesophageal reflux disease)   . History of adenomatous polyp of colon 06/24/99  . Horseshoe kidney   . Hyperlipemia   . Hypertension   . Pancreatic lesion 05/22/11   no further workup  per PCP/family due to age  . Parkinson disease ([place])   . Peripheral neuropathy ([place])   . Sigmoid volvulus ([place]) 05/24/2013  . Thrombophlebitis   . TIA (transient ischemic attack) 12/19/2012    Past Surgical History:  Procedure Laterality Date  . ABDOMINAL HYSTERECTOMY    . BOWEL DECOMPRESSION N/A 04/18/2016   Procedure: BOWEL DECOMPRESSION;  Surgeon: [name], MD;  Location: [facility];  Service: Endoscopy;  Laterality: N/A;  . BREAST SURGERY     2 benign tumors removed left breast  . CHOLECYSTECTOMY    . ESOPHAGEAL DILATION     several  times by Dr. [name]  . ESOPHAGOGASTRODUODENOSCOPY (EGD) WITH ESOPHAGEAL DILATION N/A 08/01/2012   Procedure: ESOPHAGOGASTRODUODENOSCOPY (EGD) WITH ESOPHAGEAL DILATION;  Surgeon: [name], MD;  Location: [facility];  Service: Endoscopy;  Laterality: N/A;  with c-arm savory dilators  . ESOPHAGOGASTRODUODENOSCOPY (EGD) WITH PROPOFOL N/A 03/12/2015   Procedure: ESOPHAGOGASTRODUODENOSCOPY (EGD) WITH PROPOFOL;  Surgeon: [name], MD;  Location: [facility];  Service: Endoscopy;  Laterality: N/A;  . FLEXIBLE SIGMOIDOSCOPY N/A 05/24/2013   Procedure: FLEXIBLE SIGMOIDOSCOPY;  Surgeon: [name], MD;  Location: [facility];  Service: Endoscopy;  Laterality: N/A;  . FLEXIBLE SIGMOIDOSCOPY N/A 05/26/2013   Procedure:  flex with decompression of sigmoid volvulus;  Surgeon: [name], MD;  Location: [facility];  Service: Endoscopy;  Laterality: N/A;  . FLEXIBLE SIGMOIDOSCOPY N/A 12/20/2015   Procedure: FLEXIBLE SIGMOIDOSCOPY;  Surgeon: Milus Banister, MD;  Location: [facility];  Service: Endoscopy;  Laterality: N/A;  . FLEXIBLE SIGMOIDOSCOPY N/A 04/17/2016   Procedure: FLEXIBLE SIGMOIDOSCOPY;  Surgeon: Ladene Artist, MD;  Location: [facility];  Service: Endoscopy;  Laterality: N/A;  . FLEXIBLE SIGMOIDOSCOPY N/A 04/18/2016   Procedure: FLEXIBLE SIGMOIDOSCOPY;  Surgeon: [name], MD;  Location: [facility];  Service: Endoscopy;  Laterality: N/A;  . FOOT SURGERY  benign tumors from foot  . NOSE SURGERY    . SHOULDER SURGERY  2001   left clavicle excision and acromioplasty  . TOTAL HIP ARTHROPLASTY      Social History   Social History  . Marital status: Widowed    Spouse name: N/A  . Number of children: 5  . Years of education: 10th   Occupational History  . Retired    Social History Main Topics  . Smoking status: Former Smoker    Quit date: 10/09/1964  . Smokeless tobacco: Never Used  . Alcohol use No  . Drug use: No  . Sexual activity: No   Other  Topics Concern  . Not on file   Social History Narrative   Pt lives at [facility] [place] and [place].   Caffeine Use: very small amount daily   Family History  Problem Relation Age of Onset  . Cancer    . Heart disease        VITAL SIGNS BP (!) 122/58   Pulse 61   Temp (!) 96.5 F (35.8 C)   Resp 18   Ht 5\' 7"  (1.702 m)   Wt 152 lb 10 oz (69.2 kg)   SpO2 100%   BMI 23.90 kg/m   Patient's Medications  New Prescriptions   No medications on file  Previous Medications   AMLODIPINE (NORVASC) 2.5 MG TABLET    Take 2.5 mg by mouth every morning. Hold for BP < 100/60 or HR < 60   ASPIRIN EC 81 MG TABLET    Take 81 mg by mouth daily.   BACLOFEN (LIORESAL) 10 MG TABLET    Take 5 mg by mouth at bedtime.   CARVEDILOL (COREG) 3.125 MG TABLET    Take 3.125 mg by mouth daily.   CHOLECALCIFEROL (VITAMIN D) 1000 UNITS TABLET    Take 1,000 Units by mouth every morning.    CRANBERRY 475 MG CAPS    Take 1 capsule by mouth 2 (two) times daily.    DOCUSATE SODIUM (COLACE) 100 MG CAPSULE    Take 200 mg by mouth at bedtime.    DORZOLAMIDE (TRUSOPT) 2 % OPHTHALMIC SOLUTION    Place 1 drop into both eyes 3 (three) times daily. Reported on 11/16/2015        0800,1400,2000   ESCITALOPRAM (LEXAPRO) 10 MG TABLET    Take 10 mg by mouth daily.   FAMOTIDINE (PEPCID) 20 MG TABLET    Take 20 mg by mouth at bedtime.   FEEDING SUPPLEMENT, ENSURE ENLIVE, (ENSURE ENLIVE) LIQD    Take 237 mLs by mouth 2 (two) times daily between meals.   FERROUS SULFATE 220 (44 FE) MG/5ML SOLUTION    Take 220 mg by mouth daily.   FLUTICASONE (FLONASE) 50 MCG/ACT NASAL SPRAY    Place 2 sprays into both nostrils at bedtime.   HYDROCODONE-ACETAMINOPHEN (NORCO/VICODIN) 5-325 MG TABLET    Take 1-2 tablets by mouth every 4 (four) hours as needed for moderate pain.   IPRATROPIUM-ALBUTEROL (DUONEB) 0.5-2.5 (3) MG/3ML SOLN    Take 3 mLs by nebulization daily as needed.   LIDOCAINE (LIDODERM) 5 %    Place 1 patch onto the skin daily.  Apply 1 patch to left lower back/hip every morning.  Remove & Discard patch within 12 hours or as directed by MD   LINACLOTIDE (LINZESS) 145 MCG CAPS CAPSULE    Take 145 mcg by mouth daily before breakfast.    LORATADINE (CLARITIN) 10 MG TABLET  Take 10 mg by mouth daily before breakfast.    MULTIPLE VITAMINS-MINERALS (CERTAGEN PO)    Take 1 tablet by mouth daily.    ONDANSETRON (ZOFRAN) 8 MG TABLET    Take 8 mg by mouth every 8 (eight) hours as needed for nausea or vomiting.   OXYGEN    Inhale 2 L into the lungs continuous.   POLYETHYLENE GLYCOL (MIRALAX / GLYCOLAX) PACKET    Take 17 g by mouth daily at 6 (six) AM.    POTASSIUM CHLORIDE ER 20 MEQ TBCR    Take 40 mEq by mouth daily.   PROTONIX 40 MG PACK    Take 40 mg by mouth daily at 6 (six) AM.   SENNA (SENOKOT) 8.6 MG TABS TABLET    Take 1 tablet (8.6 mg total) by mouth 2 (two) times daily.   SKIN PROTECTANTS, MISC. (CALAZIME SKIN PROTECTANT EX)    Apply 1 application topically daily. Buttocks protection   SODIUM CHLORIDE (OCEAN) 0.65 % SOLN NASAL SPRAY    Place 1 spray into both nostrils as directed. Four times daily And every 24 hours as needed to moisturize nasal passages.   TORSEMIDE (DEMADEX) 20 MG TABLET    Take 20 mg by mouth daily.   TRAVOPROST, BAK FREE, (TRAVATAN) 0.004 % SOLN OPHTHALMIC SOLUTION    Place 1 drop into both eyes daily at 8 pm.    VITAMIN B-12 (CYANOCOBALAMIN) 500 MCG TABLET    Take 500 mcg by mouth every morning.   Modified Medications   No medications on file  Discontinued Medications   BENZOCAINE-MENTHOL (CHLORASEPTIC) 6-10 MG LOZENGE    Take 1 lozenge by mouth as needed for sore throat.   BIOTIN 5 MG/ML LIQD    Take 5 mLs by mouth 2 (two) times daily.    FERROUS SULFATE 325 (65 FE) MG TABLET    Take 325 mg by mouth daily with breakfast.     SIGNIFICANT DIAGNOSTIC EXAMS  02-15-15: abdominal ultrasound: 1. Gallbladder not visualized. 2. No hydronephrosis no renal calculus; 3. The pancreas not well visualized;  which could be related to gassy abdomen but an appearance which might represent pancreatitis in the appropriate clinical setting.   03-12-15: upper endoscopy: duodenal bulb 1.5-2.0 cm ulcer with clot at base. No active bleeding. Surrounding mucosa was edematous. Biopsy of the surrounding mucosa were done.   12-20-15: ct of abdomen and pelvis: 1. Loosely twisted sigmoid volvulus with transition point. Proximal distention is improved compared to radiography earlier today suggesting partial interval decompression. 2. Chronic findings are stable and described above.  12-20-15: flex sigmoidoscopy: chronic intermittent sigmoid volvulus. The site of volvulus was only slightly twisted during this exam, easily passed with colonoscope and the air filled dilated colon proximal to it was suctioned extensively. Her abdomen was flat, normal after the exam.   02-19-16: right lower extremity doppler: neg dvt  02-23-16: renal ultrasound: normal right kidney slightly small left kidney. Right 1.9 cm cyst no hydronephrosis or stones   03-19-16: chest x-ray: modest cardiomegaly with mild pulmonary venous congestion    LABS REVIEWED:     10-14-15: wbc 5.0; hgb 11.5; hct 33.9; mcv 101.2; plt 127; glucose 132; bun 15.8; creat 1.27; k+ 3.8; na++ 143; liver normal albumin 4.0  12-20-15: wbc 5.2; hgb 11.0; hct 32.4; mcv 101.3; plt 107; glucose 138; bun 31; creat 1.61; k+ 3.2; na++ 138; liver normal albumin 3.9; urine culture: e-coli: rocephin 12-21-15: wbc 3.9; hgb 9.8; hct 29.7; mcv 103.1; plt 98; glucose 108;  bun 23; creat 1.15; k+ 3.2; na++ 143; mag 2.4 12-22-15: wbc 5.1; hgb 10.8; hct 32.3; mcv 103.2; plt 101; glucose 125; bun 18; creat 1.09; k+ 3.9; na++ 142  01-04-16: wbc 4.1; hgb 10.9; hct 32.4; mcv 100.5; plt 175; glucose 121; bun 13.2; creat 1.18; k+ 3.8; na++ 144; liver normal albumin 3.8 01-31-16; urine culture: e-coli: ESBL invanz 02-15-16: wbc 4.6; hgb 11.3; hct 36.3; mcv 102.7; plt 128; glucose 160; bun 15.3; creat  1.65; k+ 4.5; na++ 143; liver normal albumin 3.9 02-19-16: glucose 116; bun 28.7; mcv 1.72; k+ 4.5; na++ 143 02-23-16; wbc 3.5 hgb 9.7; hct 30.4; mcv 101.8; plt 126; glucose 118; bun 25.6; creat 1.52; k+ 4.6; na++ 144  03-11-16: glucose 196; bun 18.4; creat 1.28; k+ 3.7; na++ 141  03-19-16: A/B neg for influenza 04-01-16: glucose 139; bun 15.7; creat 1.16; k+ 3.1; na++ 144 04-07-16: glucose 140; bun 18.2; creat 1.37; k+ 4.1; na++ 143  04-17-16: wbc 5.0; hgb 11.1; hct 33.3; mcv 100.6; plt 119; glucose 178; bun 19; creat 1.22; k+ 3.7; na++ 141; liver normal albumin 3.8 04-18-16: wbc 4.0; hgb 9.3; hct 28.8; mcv 101.1; plt 93; glucose 124; bun 17; creat 1.20; k+ 2.9; na++ 141; liver normal albumin 3.2; mag 1.7 phos 3.7; tsh 2.572; hgb a1c 6.9 04-20-16: wbc 5.5; hgb 10.2; hct 30.9; mcv 100.0; plt 97; glucose 103; bun 14; create 1.00; k+ 3.7; na++ 143; mag 1.8  04-24-16:glucose 132; bun 17; creat 1.05; k+ 4.3; na++ 138  08-12-16: wb 4.0; hgb 10.7; hct 32.1 ;mcv 103.2; plt 95; glucose 111 bun 33.0; creat 1.57; k+ 4.4 ;na++ 143; liver normal albumin 3.5     Review of Systems  Constitutional: Negative for appetite change and fatigue.  HENT: Negative for congestion.   Respiratory: Negative for cough, chest tightness and shortness of breath.   Cardiovascular: Negative for chest pain, palpitations and leg swelling.  Gastrointestinal: Negative for nausea, abdominal pain, diarrhea and constipation.  Musculoskeletal: no complaint of muscle or joint pain    Skin: Negative for pallor.  Neurological: Negative for dizziness.  Psychiatric/Behavioral: The patient is not nervous/anxious.       Physical Exam  Constitutional: No distress.  Eyes: Conjunctivae are normal.  Neck: Neck supple. No JVD present. No thyromegaly present.  Cardiovascular: Normal rate, regular rhythm and intact distal pulses.   Respiratory: Effort normal and breath sounds normal. No respiratory distress. She has no wheezes.  Is 02  dependent  GI: Soft. Bowel sounds hypoactive; distended no tenderness  Musculoskeletal: She exhibits no edema.  Able to move all extremities .   Lymphadenopathy:    She has no cervical adenopathy.  Neurological: She is alert.  Skin: Skin is warm and dry. She is not diaphoretic.  Psychiatric: She has a normal mood and affect.     ASSESSMENT/ PLAN:  1. Chronic pain due to osteoarthritis: will continue lidoderm to lower back daily; will continue baclofen 5 mg nightly for leg spasticity will change her vicodin to 5/325 mg 1/2 tab every 6 hours as needed  2. Hypokalemia: k+ 4.4; will continue k+ 40 meq daily will check bmp monthly   Time spent with patient  45  minutes >50% time spent counseling; reviewing medical record; tests; labs; and developing future plan of care   MD is aware of resident's narcotic use and is in agreement with current plan of care. We will attempt to wean resident as apropriate    Ok Edwards NP [facility]  Contact [phone] Monday  through Friday 8am- 5pm  After hours call [phone]

## 2016-08-22 DIAGNOSIS — M2669 Other specified disorders of temporomandibular joint: Secondary | ICD-10-CM | POA: Diagnosis not present

## 2016-08-22 DIAGNOSIS — H903 Sensorineural hearing loss, bilateral: Secondary | ICD-10-CM | POA: Diagnosis not present

## 2016-09-05 ENCOUNTER — Non-Acute Institutional Stay (SKILLED_NURSING_FACILITY): Payer: Medicare Other | Admitting: Adult Health

## 2016-09-05 ENCOUNTER — Encounter: Payer: Self-pay | Admitting: Adult Health

## 2016-09-05 DIAGNOSIS — L03115 Cellulitis of right lower limb: Secondary | ICD-10-CM | POA: Diagnosis not present

## 2016-09-05 NOTE — Progress Notes (Signed)
Location:   [place] Room Number: 633 A Place of Service:  SNF (31)   CODE STATUS: DNR  Allergies  Allergen Reactions  . Aspirin Other (See Comments)    Maria Christian only    Chief Complaint  Patient presents with  . Acute Visit    RLE edema with Red Spots    HPI:  She has right lower extremity swelling present with redness; increased warmth present and tenderness to palpation. She tells me that he leg has bothered for the past couple of days. There are no reports of fever present.    Past Medical History:  Diagnosis Date  . Altered mental status   . Anemia   . Atrial fibrillation ([place])   . CKD (chronic kidney disease) stage 3, GFR 30-59 ml/min 02/27/2015  . Constipation 02/27/2015  . Coronary artery disease   . DEMENTIA   . Depression   . Diabetes mellitus   . Edema of lower extremity 07/13/11   right leg more swollen than left leg  . Encephalopathy   . Enlarged heart   . Esophageal dysmotility 07/02/12  . GERD (gastroesophageal reflux disease)   . History of adenomatous polyp of colon 06/24/99  . Horseshoe kidney   . Hyperlipemia   . Hypertension   . Pancreatic lesion 05/22/11   no further workup  per PCP/family due to age  . Parkinson disease ([place])   . Peripheral neuropathy ([place])   . Sigmoid volvulus ([place]) 05/24/2013  . Thrombophlebitis   . TIA (transient ischemic attack) 12/19/2012    Past Surgical History:  Procedure Laterality Date  . ABDOMINAL HYSTERECTOMY    . BOWEL DECOMPRESSION N/A 04/18/2016   Procedure: BOWEL DECOMPRESSION;  Surgeon: [name], MD;  Location: [facility];  Service: Endoscopy;  Laterality: N/A;  . BREAST SURGERY     2 benign tumors removed left breast  . CHOLECYSTECTOMY    . ESOPHAGEAL DILATION     several times by Dr. [name]  . ESOPHAGOGASTRODUODENOSCOPY (EGD) WITH ESOPHAGEAL DILATION N/A 08/01/2012   Procedure: ESOPHAGOGASTRODUODENOSCOPY (EGD) WITH ESOPHAGEAL DILATION;  Surgeon: [name], MD;  Location:  [facility];  Service: Endoscopy;  Laterality: N/A;  with c-arm savory dilators  . ESOPHAGOGASTRODUODENOSCOPY (EGD) WITH PROPOFOL N/A 03/12/2015   Procedure: ESOPHAGOGASTRODUODENOSCOPY (EGD) WITH PROPOFOL;  Surgeon: [name], MD;  Location: [facility];  Service: Endoscopy;  Laterality: N/A;  . FLEXIBLE SIGMOIDOSCOPY N/A 05/24/2013   Procedure: FLEXIBLE SIGMOIDOSCOPY;  Surgeon: [name], MD;  Location: [facility];  Service: Endoscopy;  Laterality: N/A;  . FLEXIBLE SIGMOIDOSCOPY N/A 05/26/2013   Procedure:  flex with decompression of sigmoid volvulus;  Surgeon: [name], MD;  Location: [facility];  Service: Endoscopy;  Laterality: N/A;  . FLEXIBLE SIGMOIDOSCOPY N/A 12/20/2015   Procedure: FLEXIBLE SIGMOIDOSCOPY;  Surgeon: Milus Banister, MD;  Location: [facility];  Service: Endoscopy;  Laterality: N/A;  . FLEXIBLE SIGMOIDOSCOPY N/A 04/17/2016   Procedure: FLEXIBLE SIGMOIDOSCOPY;  Surgeon: Ladene Artist, MD;  Location: [facility];  Service: Endoscopy;  Laterality: N/A;  . FLEXIBLE SIGMOIDOSCOPY N/A 04/18/2016   Procedure: FLEXIBLE SIGMOIDOSCOPY;  Surgeon: [name], MD;  Location: [facility];  Service: Endoscopy;  Laterality: N/A;  . FOOT SURGERY     benign tumors from foot  . NOSE SURGERY    . SHOULDER SURGERY  2001   left clavicle excision and acromioplasty  . TOTAL HIP ARTHROPLASTY      Social History   Social History  .  Marital status: Widowed    Spouse name: N/A  . Number of children: 5  . Years of education: 10th   Occupational History  . Retired    Social History Main Topics  . Smoking status: Former Smoker    Quit date: 10/09/1964  . Smokeless tobacco: Never Used  . Alcohol use No  . Drug use: No  . Sexual activity: No   Other Topics Concern  . Not on file   Social History Narrative   Pt lives at [facility] and [place].   Caffeine Use: very small amount daily   Family History  Problem Relation Age of Onset  . Cancer    .  Heart disease        VITAL SIGNS There were no vitals taken for this visit.  None available   Patient's Medications  New Prescriptions   No medications on file  Previous Medications   AMLODIPINE (NORVASC) 2.5 MG TABLET    Take 2.5 mg by mouth every morning. Hold for BP < 100/60 or HR < 60   ASPIRIN EC 81 MG TABLET    Take 81 mg by mouth daily.   BACLOFEN (LIORESAL) 10 MG TABLET    Take 5 mg by mouth at bedtime.   BENZOCAINE-MENTHOL (CHLORASEPTIC) 6-10 MG LOZENGE    Take 1 lozenge by mouth as needed for sore throat.   CARVEDILOL (COREG) 3.125 MG TABLET    Take 3.125 mg by mouth daily.   CHOLECALCIFEROL (VITAMIN D) 1000 UNITS TABLET    Take 1,000 Units by mouth every morning.    CRANBERRY 475 MG CAPS    Take 1 capsule by mouth 2 (two) times daily.    DOCUSATE SODIUM (COLACE) 100 MG CAPSULE    Take 200 mg by mouth at bedtime.    DORZOLAMIDE (TRUSOPT) 2 % OPHTHALMIC SOLUTION    Place 1 drop into both eyes 3 (three) times daily. Reported on 11/16/2015        0800,1400,2000   ESCITALOPRAM (LEXAPRO) 10 MG TABLET    Take 10 mg by mouth daily.   FAMOTIDINE (PEPCID) 20 MG TABLET    Take 20 mg by mouth at bedtime.   FEEDING SUPPLIES MISC    by Does not apply route. House 2.0 Med Pass - 120 cc by mouth  two times daily   FERROUS SULFATE 220 (44 FE) MG/5ML SOLUTION    Take 220 mg by mouth daily.   FLUTICASONE (FLONASE) 50 MCG/ACT NASAL SPRAY    Place 2 sprays into both nostrils at bedtime.   HYDROCODONE-ACETAMINOPHEN (NORCO/VICODIN) 5-325 MG TABLET    Take 0.5 tablets by mouth every 6 (six) hours as needed for moderate pain. And 1-2  tablet by mouth every 4 hours as needed for moderate pain   IPRATROPIUM-ALBUTEROL (DUONEB) 0.5-2.5 (3) MG/3ML SOLN    Take 3 mLs by nebulization daily as needed.   LIDOCAINE (LIDODERM) 5 %    Place 1 patch onto the skin daily. Apply 1 patch to left lower back/hip every morning.  Remove & Discard patch within 12 hours or as directed by MD   LINACLOTIDE (LINZESS) 145 MCG  CAPS CAPSULE    Take 145 mcg by mouth daily before breakfast.    LORATADINE (CLARITIN) 10 MG TABLET    Take 10 mg by mouth daily before breakfast.    MULTIPLE VITAMINS-MINERALS (CERTAGEN PO)    Take 1 tablet by mouth daily.    ONDANSETRON (ZOFRAN) 8 MG TABLET    Take 8  mg by mouth every 8 (eight) hours as needed for nausea or vomiting.   OXYGEN    Inhale 2 L into the lungs continuous.   POLYETHYLENE GLYCOL (MIRALAX / GLYCOLAX) PACKET    Take 17 g by mouth daily at 6 (six) AM.    POTASSIUM CHLORIDE ER 20 MEQ TBCR    Take 40 mEq by mouth daily.   PROTONIX 40 MG PACK    Take 40 mg by mouth daily at 6 (six) AM.   SENNA (SENOKOT) 8.6 MG TABS TABLET    Take 1 tablet (8.6 mg total) by mouth 2 (two) times daily.   SKIN PROTECTANTS, MISC. (CALAZIME SKIN PROTECTANT EX)    Apply 1 application topically daily. Buttocks protection   SODIUM CHLORIDE (OCEAN) 0.65 % SOLN NASAL SPRAY    Place 1 spray into both nostrils as directed. Four times daily And every 24 hours as needed to moisturize nasal passages.   TORSEMIDE (DEMADEX) 20 MG TABLET    Take 20 mg by mouth daily.   TRAVOPROST, BAK FREE, (TRAVATAN) 0.004 % SOLN OPHTHALMIC SOLUTION    Place 1 drop into both eyes daily at 8 pm.    VITAMIN B-12 (CYANOCOBALAMIN) 500 MCG TABLET    Take 500 mcg by mouth every evening.   Modified Medications   No medications on file  Discontinued Medications   FEEDING SUPPLEMENT, ENSURE ENLIVE, (ENSURE ENLIVE) LIQD    Take 237 mLs by mouth 2 (two) times daily between meals.   HYDROCODONE-ACETAMINOPHEN (NORCO/VICODIN) 5-325 MG TABLET    Take 1-2 tablets by mouth every 4 (four) hours as needed for moderate pain.     SIGNIFICANT DIAGNOSTIC EXAMS  02-15-15: abdominal ultrasound: 1. Gallbladder not visualized. 2. No hydronephrosis no renal calculus; 3. The pancreas not well visualized; which could be related to gassy abdomen but an appearance which might represent pancreatitis in the appropriate clinical setting.   03-12-15: upper  endoscopy: duodenal bulb 1.5-2.0 cm ulcer with clot at base. No active bleeding. Surrounding mucosa was edematous. Biopsy of the surrounding mucosa were done.   12-20-15: ct of abdomen and pelvis: 1. Loosely twisted sigmoid volvulus with transition point. Proximal distention is improved compared to radiography earlier today suggesting partial interval decompression. 2. Chronic findings are stable and described above.  12-20-15: flex sigmoidoscopy: chronic intermittent sigmoid volvulus. The site of volvulus was only slightly twisted during this exam, easily passed with colonoscope and the air filled dilated colon proximal to it was suctioned extensively. Her abdomen was flat, normal after the exam.   02-19-16: right lower extremity doppler: neg dvt  02-23-16: renal ultrasound: normal right kidney slightly small left kidney. Right 1.9 cm cyst no hydronephrosis or stones   03-19-16: chest x-ray: modest cardiomegaly with mild pulmonary venous congestion    LABS REVIEWED:     10-14-15: wbc 5.0; hgb 11.5; hct 33.9; mcv 101.2; plt 127; glucose 132; bun 15.8; creat 1.27; k+ 3.8; na++ 143; liver normal albumin 4.0  12-20-15: wbc 5.2; hgb 11.0; hct 32.4; mcv 101.3; plt 107; glucose 138; bun 31; creat 1.61; k+ 3.2; na++ 138; liver normal albumin 3.9; urine culture: e-coli: rocephin 12-21-15: wbc 3.9; hgb 9.8; hct 29.7; mcv 103.1; plt 98; glucose 108; bun 23; creat 1.15; k+ 3.2; na++ 143; mag 2.4 12-22-15: wbc 5.1; hgb 10.8; hct 32.3; mcv 103.2; plt 101; glucose 125; bun 18; creat 1.09; k+ 3.9; na++ 142  01-04-16: wbc 4.1; hgb 10.9; hct 32.4; mcv 100.5; plt 175; glucose 121; bun 13.2; creat 1.18;  k+ 3.8; na++ 144; liver normal albumin 3.8 01-31-16; urine culture: e-coli: ESBL invanz 02-15-16: wbc 4.6; hgb 11.3; hct 36.3; mcv 102.7; plt 128; glucose 160; bun 15.3; creat 1.65; k+ 4.5; na++ 143; liver normal albumin 3.9 02-19-16: glucose 116; bun 28.7; mcv 1.72; k+ 4.5; na++ 143 02-23-16; wbc 3.5 hgb 9.7; hct 30.4; mcv  101.8; plt 126; glucose 118; bun 25.6; creat 1.52; k+ 4.6; na++ 144  03-11-16: glucose 196; bun 18.4; creat 1.28; k+ 3.7; na++ 141  03-19-16: A/B neg for influenza 04-01-16: glucose 139; bun 15.7; creat 1.16; k+ 3.1; na++ 144 04-07-16: glucose 140; bun 18.2; creat 1.37; k+ 4.1; na++ 143  04-17-16: wbc 5.0; hgb 11.1; hct 33.3; mcv 100.6; plt 119; glucose 178; bun 19; creat 1.22; k+ 3.7; na++ 141; liver normal albumin 3.8 04-18-16: wbc 4.0; hgb 9.3; hct 28.8; mcv 101.1; plt 93; glucose 124; bun 17; creat 1.20; k+ 2.9; na++ 141; liver normal albumin 3.2; mag 1.7 phos 3.7; tsh 2.572; hgb a1c 6.9 04-20-16: wbc 5.5; hgb 10.2; hct 30.9; mcv 100.0; plt 97; glucose 103; bun 14; create 1.00; k+ 3.7; na++ 143; mag 1.8  04-24-16:glucose 132; bun 17; creat 1.05; k+ 4.3; na++ 138  08-12-16: wb 4.0; hgb 10.7; hct 32.1 ;mcv 103.2; plt 95; glucose 111 bun 33.0; creat 1.57; k+ 4.4 ;na++ 143; liver normal albumin 3.5     Review of Systems  Constitutional: Negative for appetite change and fatigue.  HENT: Negative for congestion.   Respiratory: Negative for cough, chest tightness and shortness of breath.   Cardiovascular: Negative for chest pain, palpitations has right lower edema swelling  Gastrointestinal: Negative for nausea, abdominal pain, diarrhea and constipation.  Musculoskeletal:  has right lower leg redness; warmth and pain  Skin: Negative for pallor.  Neurological: Negative for dizziness.  Psychiatric/Behavioral: The patient is not nervous/anxious.       Physical Exam  Constitutional: No distress.  Eyes: Conjunctivae are normal.  Neck: Neck supple. No JVD present. No thyromegaly present.  Cardiovascular: Normal rate, regular rhythm and intact distal pulses.   Respiratory: Effort normal and breath sounds normal. No respiratory distress. She has no wheezes.  Is 02 dependent  GI: Soft. Bowel sounds normal; distended no tenderness  Musculoskeletal: has right lower extremity swelling   Able to move  all extremities .   Lymphadenopathy:    She has no cervical adenopathy.  Neurological: She is alert.  Skin: Skin is warm and dry. She is not diaphoretic.  Right lower extremity red; hot inflamed and tender to touch  Psychiatric: She has a normal mood and affect.     ASSESSMENT/ PLAN:  1. Right lower leg cellulitis: will begin doxycycline 100 mg twice daily for 3 weeks; with florastor; will get a venous doppler to rule out dvt will monitor her status    MD is aware of resident's narcotic use and is in agreement with current plan of care. We will attempt to wean resident as apropriate    Ok Edwards NP [facility]  Contact [phone] Monday through Friday 8am- 5pm  After hours call [phone]

## 2016-09-08 DIAGNOSIS — F0391 Unspecified dementia with behavioral disturbance: Secondary | ICD-10-CM | POA: Diagnosis not present

## 2016-09-08 DIAGNOSIS — F411 Generalized anxiety disorder: Secondary | ICD-10-CM | POA: Diagnosis not present

## 2016-09-12 ENCOUNTER — Encounter: Payer: Self-pay | Admitting: Adult Health

## 2016-09-12 ENCOUNTER — Non-Acute Institutional Stay (SKILLED_NURSING_FACILITY): Payer: Medicare Other | Admitting: Adult Health

## 2016-09-12 DIAGNOSIS — I5032 Chronic diastolic (congestive) heart failure: Secondary | ICD-10-CM | POA: Diagnosis not present

## 2016-09-12 DIAGNOSIS — N183 Chronic kidney disease, stage 3 unspecified: Secondary | ICD-10-CM

## 2016-09-12 DIAGNOSIS — I482 Chronic atrial fibrillation, unspecified: Secondary | ICD-10-CM

## 2016-09-12 DIAGNOSIS — K562 Volvulus: Secondary | ICD-10-CM | POA: Diagnosis not present

## 2016-09-12 DIAGNOSIS — I110 Hypertensive heart disease with heart failure: Secondary | ICD-10-CM

## 2016-09-12 DIAGNOSIS — K219 Gastro-esophageal reflux disease without esophagitis: Secondary | ICD-10-CM | POA: Diagnosis not present

## 2016-09-12 DIAGNOSIS — J9611 Chronic respiratory failure with hypoxia: Secondary | ICD-10-CM

## 2016-09-12 NOTE — Progress Notes (Signed)
Location:   [facility] Room Number: 809 A Place of Service:  SNF (31)   CODE STATUS: DNR  Allergies  Allergen Reactions  . Aspirin Other (See Comments)    [name]    Chief Complaint  Patient presents with  . Medical Management of Chronic Issues    1 month follow up    HPI:  She is a long term resident of this facility being seen for the management of her chronic illnesses. She is nearly finished with her doxycycline for her right lower leg cellulitis; there is less edema in her lower leg. She tells me that she is feeling better.    Past Medical History:  Diagnosis Date  . Altered mental status   . Anemia   . Atrial fibrillation ([place])   . CKD (chronic kidney disease) stage 3, GFR 30-59 ml/min 02/27/2015  . Constipation 02/27/2015  . Coronary artery disease   . DEMENTIA   . Depression   . Diabetes mellitus   . Edema of lower extremity 07/13/11   right leg more swollen than left leg  . Encephalopathy   . Enlarged heart   . Esophageal dysmotility 07/02/12  . GERD (gastroesophageal reflux disease)   . History of adenomatous polyp of colon 06/24/99  . Horseshoe kidney   . Hyperlipemia   . Hypertension   . Pancreatic lesion 05/22/11   no further workup  per PCP/family due to age  . Parkinson disease ([place])   . Peripheral neuropathy ([place])   . Sigmoid volvulus ([place]) 05/24/2013  . Thrombophlebitis   . TIA (transient ischemic attack) 12/19/2012    Past Surgical History:  Procedure Laterality Date  . ABDOMINAL HYSTERECTOMY    . BOWEL DECOMPRESSION N/A 04/18/2016   Procedure: BOWEL DECOMPRESSION;  Surgeon: [name], MD;  Location: [facility];  Service: Endoscopy;  Laterality: N/A;  . BREAST SURGERY     2 benign tumors removed left breast  . CHOLECYSTECTOMY    . ESOPHAGEAL DILATION     several times by Dr. [name]  . ESOPHAGOGASTRODUODENOSCOPY (EGD) WITH ESOPHAGEAL DILATION N/A 08/01/2012   Procedure: ESOPHAGOGASTRODUODENOSCOPY (EGD) WITH  ESOPHAGEAL DILATION;  Surgeon: [name], MD;  Location: [facility];  Service: Endoscopy;  Laterality: N/A;  with c-arm savory dilators  . ESOPHAGOGASTRODUODENOSCOPY (EGD) WITH PROPOFOL N/A 03/12/2015   Procedure: ESOPHAGOGASTRODUODENOSCOPY (EGD) WITH PROPOFOL;  Surgeon: [name], MD;  Location: [facility];  Service: Endoscopy;  Laterality: N/A;  . FLEXIBLE SIGMOIDOSCOPY N/A 05/24/2013   Procedure: FLEXIBLE SIGMOIDOSCOPY;  Surgeon: [name], MD;  Location: [facility];  Service: Endoscopy;  Laterality: N/A;  . FLEXIBLE SIGMOIDOSCOPY N/A 05/26/2013   Procedure:  flex with decompression of sigmoid volvulus;  Surgeon: [name], MD;  Location: [facility];  Service: Endoscopy;  Laterality: N/A;  . FLEXIBLE SIGMOIDOSCOPY N/A 12/20/2015   Procedure: FLEXIBLE SIGMOIDOSCOPY;  Surgeon: Milus Banister, MD;  Location: [facility];  Service: Endoscopy;  Laterality: N/A;  . FLEXIBLE SIGMOIDOSCOPY N/A 04/17/2016   Procedure: FLEXIBLE SIGMOIDOSCOPY;  Surgeon: Ladene Artist, MD;  Location: [facility];  Service: Endoscopy;  Laterality: N/A;  . FLEXIBLE SIGMOIDOSCOPY N/A 04/18/2016   Procedure: FLEXIBLE SIGMOIDOSCOPY;  Surgeon: [name], MD;  Location: [facility];  Service: Endoscopy;  Laterality: N/A;  . FOOT SURGERY     benign tumors from foot  . NOSE SURGERY    . SHOULDER SURGERY  2001   left clavicle excision and acromioplasty  . TOTAL HIP ARTHROPLASTY  Social History   Social History  . Marital status: Widowed    Spouse name: N/A  . Number of children: 5  . Years of education: 10th   Occupational History  . Retired    Social History Main Topics  . Smoking status: Former Smoker    Quit date: 10/09/1964  . Smokeless tobacco: Never Used  . Alcohol use No  . Drug use: No  . Sexual activity: No   Other Topics Concern  . Not on file   Social History Narrative   Pt lives at [facility] and [place].   Caffeine Use: very small amount daily    Family History  Problem Relation Age of Onset  . Cancer    . Heart disease        VITAL SIGNS BP (!) 136/56   Pulse (!) 53   Temp (!) 96.7 F (35.9 C)   Wt 152 lb 10 oz (69.2 kg)   BMI 23.90 kg/m   Patient's Medications  New Prescriptions   No medications on file  Previous Medications   AMLODIPINE (NORVASC) 2.5 MG TABLET    Take 2.5 mg by mouth every morning. Hold for BP < 100/60 or HR < 60   ASPIRIN EC 81 MG TABLET    Take 81 mg by mouth daily.   BACLOFEN (LIORESAL) 10 MG TABLET    Take 5 mg by mouth at bedtime.   BENZOCAINE-MENTHOL (CHLORASEPTIC) 6-10 MG LOZENGE    Take 1 lozenge by mouth as needed for sore throat.   CARVEDILOL (COREG) 3.125 MG TABLET    Take 3.125 mg by mouth daily.   CHOLECALCIFEROL (VITAMIN D) 1000 UNITS TABLET    Take 1,000 Units by mouth every morning.    CRANBERRY 475 MG CAPS    Take 1 capsule by mouth 2 (two) times daily.    DOCUSATE SODIUM (COLACE) 100 MG CAPSULE    Take 200 mg by mouth at bedtime.    DORZOLAMIDE (TRUSOPT) 2 % OPHTHALMIC SOLUTION    Place 1 drop into both eyes 3 (three) times daily. Reported on 11/16/2015        0800,1400,2000   DOXYCYCLINE (DORYX) 100 MG EC TABLET    Take 100 mg by mouth 2 (two) times daily. For Right Leg Cellulitis   ESCITALOPRAM (LEXAPRO) 10 MG TABLET    Take 10 mg by mouth daily.   FAMOTIDINE (PEPCID) 20 MG TABLET    Take 20 mg by mouth at bedtime.   FEEDING SUPPLIES MISC    by Does not apply route. House 2.0 Med Pass - 120 cc by mouth  two times daily   FERROUS SULFATE 220 (44 FE) MG/5ML SOLUTION    Take 220 mg by mouth daily.   FLUTICASONE (FLONASE) 50 MCG/ACT NASAL SPRAY    Place 2 sprays into both nostrils at bedtime.   HYDROCODONE-ACETAMINOPHEN (NORCO/VICODIN) 5-325 MG TABLET    Take 0.5 tablets by mouth every 6 (six) hours as needed for moderate pain. And 1-2  tablet by mouth every 4 hours as needed for moderate pain   IPRATROPIUM-ALBUTEROL (DUONEB) 0.5-2.5 (3) MG/3ML SOLN    Take 3 mLs by nebulization  daily as needed.   LIDOCAINE (LIDODERM) 5 %    Place 1 patch onto the skin daily. Apply 1 patch to left lower back/hip every morning.  Remove & Discard patch within 12 hours or as directed by MD   LINACLOTIDE (LINZESS) 145 MCG CAPS CAPSULE    Take 145 mcg by mouth  daily before breakfast.    LORATADINE (CLARITIN) 10 MG TABLET    Take 10 mg by mouth daily before breakfast.    MULTIPLE VITAMINS-MINERALS (CERTAGEN PO)    Take 1 tablet by mouth daily.    ONDANSETRON (ZOFRAN) 8 MG TABLET    Take 8 mg by mouth every 8 (eight) hours as needed for nausea or vomiting.   OXYGEN    Inhale 2 L into the lungs continuous.   POLYETHYLENE GLYCOL (MIRALAX / GLYCOLAX) PACKET    Take 17 g by mouth daily at 6 (six) AM.    POTASSIUM CHLORIDE ER 20 MEQ TBCR    Take 40 mEq by mouth daily.   PROTONIX 40 MG PACK    Take 40 mg by mouth daily at 6 (six) AM.   SACCHAROMYCES BOULARDII (FLORASTOR) 250 MG CAPSULE    Take 250 mg by mouth 2 (two) times daily.   SENNA (SENOKOT) 8.6 MG TABS TABLET    Take 1 tablet (8.6 mg total) by mouth 2 (two) times daily.   SKIN PROTECTANTS, MISC. (CALAZIME SKIN PROTECTANT EX)    Apply 1 application topically daily. Buttocks protection   SODIUM CHLORIDE (OCEAN) 0.65 % SOLN NASAL SPRAY    Place 1 spray into both nostrils as directed. Four times daily And every 24 hours as needed to moisturize nasal passages.   TORSEMIDE (DEMADEX) 20 MG TABLET    Take 20 mg by mouth daily.   TRAVOPROST, BAK FREE, (TRAVATAN) 0.004 % SOLN OPHTHALMIC SOLUTION    Place 1 drop into both eyes daily at 8 pm.    VITAMIN B-12 (CYANOCOBALAMIN) 500 MCG TABLET    Take 500 mcg by mouth every evening.   Modified Medications   No medications on file  Discontinued Medications   No medications on file     SIGNIFICANT DIAGNOSTIC EXAMS  02-15-15: abdominal ultrasound: 1. Gallbladder not visualized. 2. No hydronephrosis no renal calculus; 3. The pancreas not well visualized; which could be related to gassy abdomen but an  appearance which might represent pancreatitis in the appropriate clinical setting.   03-12-15: upper endoscopy: duodenal bulb 1.5-2.0 cm ulcer with clot at base. No active bleeding. Surrounding mucosa was edematous. Biopsy of the surrounding mucosa were done.   12-20-15: ct of abdomen and pelvis: 1. Loosely twisted sigmoid volvulus with transition point. Proximal distention is improved compared to radiography earlier today suggesting partial interval decompression. 2. Chronic findings are stable and described above.  12-20-15: flex sigmoidoscopy: chronic intermittent sigmoid volvulus. The site of volvulus was only slightly twisted during this exam, easily passed with colonoscope and the air filled dilated colon proximal to it was suctioned extensively. Her abdomen was flat, normal after the exam.   02-19-16: right lower extremity doppler: neg dvt  02-23-16: renal ultrasound: normal right kidney slightly small left kidney. Right 1.9 cm cyst no hydronephrosis or stones   03-19-16: chest x-ray: modest cardiomegaly with mild pulmonary venous congestion   09-05-16: right lower extremity doppler: DVT: neg    LABS REVIEWED:     10-14-15: wbc 5.0; hgb 11.5; hct 33.9; mcv 101.2; plt 127; glucose 132; bun 15.8; creat 1.27; k+ 3.8; na++ 143; liver normal albumin 4.0  12-20-15: wbc 5.2; hgb 11.0; hct 32.4; mcv 101.3; plt 107; glucose 138; bun 31; creat 1.61; k+ 3.2; na++ 138; liver normal albumin 3.9; urine culture: e-coli: rocephin 12-21-15: wbc 3.9; hgb 9.8; hct 29.7; mcv 103.1; plt 98; glucose 108; bun 23; creat 1.15; k+ 3.2; na++ 143; mag 2.4  12-22-15: wbc 5.1; hgb 10.8; hct 32.3; mcv 103.2; plt 101; glucose 125; bun 18; creat 1.09; k+ 3.9; na++ 142  01-04-16: wbc 4.1; hgb 10.9; hct 32.4; mcv 100.5; plt 175; glucose 121; bun 13.2; creat 1.18; k+ 3.8; na++ 144; liver normal albumin 3.8 01-31-16; urine culture: e-coli: ESBL invanz 02-15-16: wbc 4.6; hgb 11.3; hct 36.3; mcv 102.7; plt 128; glucose 160; bun 15.3;  creat 1.65; k+ 4.5; na++ 143; liver normal albumin 3.9 02-19-16: glucose 116; bun 28.7; mcv 1.72; k+ 4.5; na++ 143 02-23-16; wbc 3.5 hgb 9.7; hct 30.4; mcv 101.8; plt 126; glucose 118; bun 25.6; creat 1.52; k+ 4.6; na++ 144  03-11-16: glucose 196; bun 18.4; creat 1.28; k+ 3.7; na++ 141  03-19-16: A/B neg for influenza 04-01-16: glucose 139; bun 15.7; creat 1.16; k+ 3.1; na++ 144 04-07-16: glucose 140; bun 18.2; creat 1.37; k+ 4.1; na++ 143  04-17-16: wbc 5.0; hgb 11.1; hct 33.3; mcv 100.6; plt 119; glucose 178; bun 19; creat 1.22; k+ 3.7; na++ 141; liver normal albumin 3.8 04-18-16: wbc 4.0; hgb 9.3; hct 28.8; mcv 101.1; plt 93; glucose 124; bun 17; creat 1.20; k+ 2.9; na++ 141; liver normal albumin 3.2; mag 1.7 phos 3.7; tsh 2.572; hgb a1c 6.9 04-20-16: wbc 5.5; hgb 10.2; hct 30.9; mcv 100.0; plt 97; glucose 103; bun 14; create 1.00; k+ 3.7; na++ 143; mag 1.8  04-24-16:glucose 132; bun 17; creat 1.05; k+ 4.3; na++ 138  08-12-16: wb 4.0; hgb 10.7; hct 32.1 ;mcv 103.2; plt 95; glucose 111 bun 33.0; creat 1.57; k+ 4.4 ;na++ 143; liver normal albumin 3.5     Review of Systems  Constitutional: Negative for appetite change and fatigue.  HENT: Negative for congestion.   Respiratory: Negative for cough, chest tightness and shortness of breath.   Cardiovascular: Negative for chest pain, palpitations and leg swelling.  Gastrointestinal: Negative for nausea, abdominal pain, diarrhea and constipation.  Musculoskeletal: no complaint of muscle or joint pain    Skin: Negative for pallor.  Neurological: Negative for dizziness.  Psychiatric/Behavioral: The patient is not nervous/anxious.       Physical Exam  Constitutional: No distress.  Eyes: Conjunctivae are normal.  Neck: Neck supple. No JVD present. No thyromegaly present.  Cardiovascular: Normal rate, regular rhythm and intact distal pulses.   Respiratory: Effort normal and breath sounds normal. No respiratory distress. She has no wheezes.  Is 02  dependent  GI: Soft. Bowel sounds hypoactive; distended no tenderness  Musculoskeletal: She exhibits no edema.  Able to move all extremities .   Lymphadenopathy:    She has no cervical adenopathy.  Neurological: She is alert.  Skin: Skin is warm and dry. She is not diaphoretic.  Psychiatric: She has a normal mood and affect.     ASSESSMENT/ PLAN:  1. Hypertension: will continue norvasc 2.5 mg daily coreg 3.15 mg daily   2. Chronic diastolic heart failure: is presently not on diuretic  Will not make changes will monitor   3. Duodenal ulcer: will continue protonix 40 mg  daily  Has zofran 8 mg every 8 hours as needed  Will continue pepcid 20 mg nightly to help with gerd symptoms and to avoid using PPI twice daily   4. UTI: will continue cranberry daily      5. Anemia: will continue iron daily hgb is 10.2  6. Chronic afib: will continue coreg 3.125 mg daily for rate control. Will continue asa 81mg  daily .    7. Chronic respiratory failure: is 02 dependent. Has duoneb  every 6 hours as needed   8. Allergic rhinitis: will continue flonase nightly and claritin 10 mg daily   9. Chronic pain due to osteoarthritis: will continue lidoderm to lower back daily; will continue baclofen 5 mg nightly for leg spasticity has vicodin 5/325 mg1/2 tab every 6 hours as needed for pain   10. Depression: is stable receives benefit form lexapro 10 mg daily   11. Chronic constipation  Has recurrent sigmoid volvulus: will continue colace 200 mg daily linzess 145 mcg daily; senna twice daily miralax daily   Her family has decided upon conservative treatment including repeat decompression by endoscope if needed.   12. Glaucoma: will continue  trusopt to both eyes three times daily; travatan to both eyes nightly   13. CKD stage III: bun/creat 33.0/1.57  14. Hypokalemia: k+ 4.4; will continue k+ 40 meq daily  15. Right lower leg cellulitis: will complete doxycycline and will wear ted hose will monitor       MD is aware of resident's narcotic use and is in agreement with current plan of care. We will attempt to wean resident as apropriate     Ok Edwards NP [facility]  Contact [phone] Monday through Friday 8am- 5pm  After hours call [phone]

## 2016-09-18 DIAGNOSIS — H01009 Unspecified blepharitis unspecified eye, unspecified eyelid: Secondary | ICD-10-CM | POA: Diagnosis not present

## 2016-09-18 DIAGNOSIS — E119 Type 2 diabetes mellitus without complications: Secondary | ICD-10-CM | POA: Diagnosis not present

## 2016-09-18 DIAGNOSIS — H04123 Dry eye syndrome of bilateral lacrimal glands: Secondary | ICD-10-CM | POA: Diagnosis not present

## 2016-09-18 DIAGNOSIS — H401134 Primary open-angle glaucoma, bilateral, indeterminate stage: Secondary | ICD-10-CM | POA: Diagnosis not present

## 2016-10-05 ENCOUNTER — Other Ambulatory Visit: Payer: Self-pay | Admitting: Vascular Surgery

## 2016-10-05 DIAGNOSIS — M79604 Pain in right leg: Secondary | ICD-10-CM

## 2016-10-17 ENCOUNTER — Non-Acute Institutional Stay (SKILLED_NURSING_FACILITY): Payer: Medicare Other | Admitting: Adult Health

## 2016-10-17 ENCOUNTER — Encounter: Payer: Self-pay | Admitting: Adult Health

## 2016-10-17 DIAGNOSIS — K219 Gastro-esophageal reflux disease without esophagitis: Secondary | ICD-10-CM

## 2016-10-17 DIAGNOSIS — I482 Chronic atrial fibrillation, unspecified: Secondary | ICD-10-CM

## 2016-10-17 DIAGNOSIS — N183 Chronic kidney disease, stage 3 unspecified: Secondary | ICD-10-CM

## 2016-10-17 DIAGNOSIS — K562 Volvulus: Secondary | ICD-10-CM

## 2016-10-17 DIAGNOSIS — I110 Hypertensive heart disease with heart failure: Secondary | ICD-10-CM | POA: Diagnosis not present

## 2016-10-17 DIAGNOSIS — I5032 Chronic diastolic (congestive) heart failure: Secondary | ICD-10-CM

## 2016-10-17 DIAGNOSIS — M159 Polyosteoarthritis, unspecified: Secondary | ICD-10-CM

## 2016-10-17 DIAGNOSIS — M150 Primary generalized (osteo)arthritis: Secondary | ICD-10-CM

## 2016-10-17 DIAGNOSIS — K5901 Slow transit constipation: Secondary | ICD-10-CM

## 2016-10-17 DIAGNOSIS — G8929 Other chronic pain: Secondary | ICD-10-CM | POA: Diagnosis not present

## 2016-10-17 NOTE — Progress Notes (Signed)
Location:   [facility] Room Number: 263 A Place of Service:  SNF (31)   CODE STATUS: DNR  Allergies  Allergen Reactions  . Aspirin Other (See Comments)    [name]    Chief Complaint  Patient presents with  . Medical Management of Chronic Issues    1 month follow up    HPI:  She is a long term resident of this facility being seen for the management of her chronic illnesses. Overall her status is stable. She is getting out of bed daily; she tells me that she is feeling good. There are no nursing concerns at this time.    Past Medical History:  Diagnosis Date  . Altered mental status   . Anemia   . Atrial fibrillation ([place])   . CKD (chronic kidney disease) stage 3, GFR 30-59 ml/min 02/27/2015  . Constipation 02/27/2015  . Coronary artery disease   . DEMENTIA   . Depression   . Diabetes mellitus   . Edema of lower extremity 07/13/11   right leg more swollen than left leg  . Encephalopathy   . Enlarged heart   . Esophageal dysmotility 07/02/12  . GERD (gastroesophageal reflux disease)   . History of adenomatous polyp of colon 06/24/99  . Horseshoe kidney   . Hyperlipemia   . Hypertension   . Pancreatic lesion 05/22/11   no further workup  per PCP/family due to age  . Parkinson disease ([place])   . Peripheral neuropathy   . Sigmoid volvulus ([place]) 05/24/2013  . Thrombophlebitis   . TIA (transient ischemic attack) 12/19/2012    Past Surgical History:  Procedure Laterality Date  . ABDOMINAL HYSTERECTOMY    . BOWEL DECOMPRESSION N/A 04/18/2016   Procedure: BOWEL DECOMPRESSION;  Surgeon: [name], MD;  Location: [facility];  Service: Endoscopy;  Laterality: N/A;  . BREAST SURGERY     2 benign tumors removed left breast  . CHOLECYSTECTOMY    . ESOPHAGEAL DILATION     several times by Dr. [name]  . ESOPHAGOGASTRODUODENOSCOPY (EGD) WITH ESOPHAGEAL DILATION N/A 08/01/2012   Procedure: ESOPHAGOGASTRODUODENOSCOPY (EGD) WITH ESOPHAGEAL DILATION;   Surgeon: [name], MD;  [facility]: [facility];  Service: Endoscopy;  Laterality: N/A;  with c-arm savory dilators  . ESOPHAGOGASTRODUODENOSCOPY (EGD) WITH PROPOFOL N/A 03/12/2015   Procedure: ESOPHAGOGASTRODUODENOSCOPY (EGD) WITH PROPOFOL;  Surgeon: [name], MD;  [facility]: [facility];  Service: Endoscopy;  Laterality: N/A;  . FLEXIBLE SIGMOIDOSCOPY N/A 05/24/2013   Procedure: FLEXIBLE SIGMOIDOSCOPY;  Surgeon: [name], MD;  [facility]: [facility];  Service: Endoscopy;  Laterality: N/A;  . FLEXIBLE SIGMOIDOSCOPY N/A 05/26/2013   Procedure:  flex with decompression of sigmoid volvulus;  Surgeon: [name], MD;  [facility]: [facility];  Service: Endoscopy;  Laterality: N/A;  . FLEXIBLE SIGMOIDOSCOPY N/A 12/20/2015   Procedure: FLEXIBLE SIGMOIDOSCOPY;  Surgeon: Milus Banister, MD;  Location[facility];  Service: Endoscopy;  Laterality: N/A;  . FLEXIBLE SIGMOIDOSCOPY N/A 04/17/2016   Procedure: FLEXIBLE SIGMOIDOSCOPY;  Surgeon: Ladene Artist, MD;  Location[facility];  Service: Endoscopy;  Laterality: N/A;  . FLEXIBLE SIGMOIDOSCOPY N/A 04/18/2016   Procedure: FLEXIBLE SIGMOIDOSCOPY;  Surgeon: [name], MD;  Location: [facility];  Service: Endoscopy;  Laterality: N/A;  . FOOT SURGERY     benign tumors from foot  . NOSE SURGERY    . SHOULDER SURGERY  2001   left clavicle excision and acromioplasty  . TOTAL HIP ARTHROPLASTY  Social History   Social History  . Marital status: Widowed    Spouse name: N/A  . Number of children: 5  . Years of education: 10th   Occupational History  . Retired    Social History Main Topics  . Smoking status: Former Smoker    Quit date: 10/09/1964  . Smokeless tobacco: Never Used  . Alcohol use No  . Drug use: No  . Sexual activity: No   Other Topics Concern  . Not on file   Social History Narrative   Pt lives at [facility] and [place].   Caffeine Use: very small amount daily   Family History  Problem  Relation Age of Onset  . Cancer Unknown   . Heart disease Unknown       VITAL SIGNS BP 123/60   Pulse 63   Temp 97.2 F (36.2 C)   Resp 17   Ht 5\' 7"  (1.702 m)   Wt 167 lb (75.8 kg)   SpO2 98%   BMI 26.16 kg/m   Patient's Medications  New Prescriptions   No medications on file  Previous Medications   AMLODIPINE (NORVASC) 2.5 MG TABLET    Take 2.5 mg by mouth every morning. Hold for BP < 100/60 or HR < 60   ASPIRIN EC 81 MG TABLET    Take 81 mg by mouth daily.   BACLOFEN (LIORESAL) 10 MG TABLET    Take 5 mg by mouth at bedtime.   BENZOCAINE-MENTHOL (CHLORASEPTIC) 6-10 MG LOZENGE    Take 1 lozenge by mouth as needed for sore throat.   CARVEDILOL (COREG) 3.125 MG TABLET    Take 3.125 mg by mouth daily.   CHOLECALCIFEROL (VITAMIN D) 1000 UNITS TABLET    Take 1,000 Units by mouth every morning.    CRANBERRY 475 MG CAPS    Take 1 capsule by mouth 2 (two) times daily.    DOCUSATE SODIUM (COLACE) 100 MG CAPSULE    Take 200 mg by mouth at bedtime.    DORZOLAMIDE (TRUSOPT) 2 % OPHTHALMIC SOLUTION    Place 1 drop into both eyes 3 (three) times daily. Reported on 11/16/2015        0800,1400,2000   ESCITALOPRAM (LEXAPRO) 10 MG TABLET    Take 10 mg by mouth daily.   FAMOTIDINE (PEPCID) 20 MG TABLET    Take 20 mg by mouth at bedtime.   FEEDING SUPPLIES MISC    by Does not apply route. House 2.0 Med Pass - 120 cc by mouth  two times daily   FERROUS SULFATE 220 (44 FE) MG/5ML SOLUTION    Take 220 mg by mouth daily.   FLUTICASONE (FLONASE) 50 MCG/ACT NASAL SPRAY    Place 2 sprays into both nostrils at bedtime.   IPRATROPIUM-ALBUTEROL (DUONEB) 0.5-2.5 (3) MG/3ML SOLN    Take 3 mLs by nebulization daily as needed.   LIDOCAINE (LIDODERM) 5 %    Place 1 patch onto the skin daily. Apply 1 patch to left lower back/hip every morning.  Remove & Discard patch within 12 hours or as directed by MD   LINACLOTIDE (LINZESS) 145 MCG CAPS CAPSULE    Take 145 mcg by mouth daily before breakfast.    LORATADINE  (CLARITIN) 10 MG TABLET    Take 10 mg by mouth daily before breakfast.    MULTIPLE VITAMINS-MINERALS (CERTAGEN PO)    Take 1 tablet by mouth daily.    ONDANSETRON (ZOFRAN) 8 MG TABLET    Take 8 mg  by mouth every 8 (eight) hours as needed for nausea or vomiting.   OXYGEN    Inhale 2 L into the lungs continuous.   POLYETHYLENE GLYCOL (MIRALAX / GLYCOLAX) PACKET    Take 17 g by mouth daily at 6 (six) AM.    POTASSIUM CHLORIDE ER 20 MEQ TBCR    Take 40 mEq by mouth daily.   PROTONIX 40 MG PACK    Take 40 mg by mouth daily at 6 (six) AM.   SENNA (SENOKOT) 8.6 MG TABS TABLET    Take 1 tablet (8.6 mg total) by mouth 2 (two) times daily.   SKIN PROTECTANTS, MISC. (CALAZIME SKIN PROTECTANT EX)    Apply 1 application topically daily. Buttocks protection   SODIUM CHLORIDE (OCEAN) 0.65 % SOLN NASAL SPRAY    Place 1 spray into both nostrils as directed. Four times daily And every 24 hours as needed to moisturize nasal passages.   TORSEMIDE (DEMADEX) 20 MG TABLET    Take 20 mg by mouth daily.   TRAVOPROST, BAK FREE, (TRAVATAN) 0.004 % SOLN OPHTHALMIC SOLUTION    Place 1 drop into both eyes daily at 8 pm.    VITAMIN B-12 (CYANOCOBALAMIN) 500 MCG TABLET    Take 500 mcg by mouth every evening.   Modified Medications   Modified Medication Previous Medication   HYDROCODONE-ACETAMINOPHEN (NORCO/VICODIN) 5-325 MG TABLET HYDROcodone-acetaminophen (NORCO/VICODIN) 5-325 MG tablet      Take one-half (1/2) tablet by mouth every 6 hours as needed for pain    Take one to two tablets by mouth every 4 hours as needed for moderate pain  Discontinued Medications   DOXYCYCLINE (DORYX) 100 MG EC TABLET    Take 100 mg by mouth 2 (two) times daily. For Right Leg Cellulitis   HYDROCODONE-ACETAMINOPHEN (NORCO/VICODIN) 5-325 MG TABLET    Take 0.5 tablets by mouth every 6 (six) hours as needed for moderate pain. And 1-2  tablet by mouth every 4 hours as needed for moderate pain     SIGNIFICANT DIAGNOSTIC EXAMS  02-15-15: abdominal  ultrasound: 1. Gallbladder not visualized. 2. No hydronephrosis no renal calculus; 3. The pancreas not well visualized; which could be related to gassy abdomen but an appearance which might represent pancreatitis in the appropriate clinical setting.   03-12-15: upper endoscopy: duodenal bulb 1.5-2.0 cm ulcer with clot at base. No active bleeding. Surrounding mucosa was edematous. Biopsy of the surrounding mucosa were done.   12-20-15: ct of abdomen and pelvis: 1. Loosely twisted sigmoid volvulus with transition point. Proximal distention is improved compared to radiography earlier today suggesting partial interval decompression. 2. Chronic findings are stable and described above.  12-20-15: flex sigmoidoscopy: chronic intermittent sigmoid volvulus. The site of volvulus was only slightly twisted during this exam, easily passed with colonoscope and the air filled dilated colon proximal to it was suctioned extensively. Her abdomen was flat, normal after the exam.   02-19-16: right lower extremity doppler: neg dvt  02-23-16: renal ultrasound: normal right kidney slightly small left kidney. Right 1.9 cm cyst no hydronephrosis or stones   03-19-16: chest x-ray: modest cardiomegaly with mild pulmonary venous congestion   09-05-16: right lower extremity doppler: DVT: neg    LABS REVIEWED:     10-14-15: wbc 5.0; hgb 11.5; hct 33.9; mcv 101.2; plt 127; glucose 132; bun 15.8; creat 1.27; k+ 3.8; na++ 143; liver normal albumin 4.0  12-20-15: wbc 5.2; hgb 11.0; hct 32.4; mcv 101.3; plt 107; glucose 138; bun 31; creat 1.61; k+ 3.2; na++  138; liver normal albumin 3.9; urine culture: e-coli: rocephin 12-21-15: wbc 3.9; hgb 9.8; hct 29.7; mcv 103.1; plt 98; glucose 108; bun 23; creat 1.15; k+ 3.2; na++ 143; mag 2.4 12-22-15: wbc 5.1; hgb 10.8; hct 32.3; mcv 103.2; plt 101; glucose 125; bun 18; creat 1.09; k+ 3.9; na++ 142  01-04-16: wbc 4.1; hgb 10.9; hct 32.4; mcv 100.5; plt 175; glucose 121; bun 13.2; creat 1.18; k+  3.8; na++ 144; liver normal albumin 3.8 01-31-16; urine culture: e-coli: ESBL invanz 02-15-16: wbc 4.6; hgb 11.3; hct 36.3; mcv 102.7; plt 128; glucose 160; bun 15.3; creat 1.65; k+ 4.5; na++ 143; liver normal albumin 3.9 02-19-16: glucose 116; bun 28.7; mcv 1.72; k+ 4.5; na++ 143 02-23-16; wbc 3.5 hgb 9.7; hct 30.4; mcv 101.8; plt 126; glucose 118; bun 25.6; creat 1.52; k+ 4.6; na++ 144  03-11-16: glucose 196; bun 18.4; creat 1.28; k+ 3.7; na++ 141  03-19-16: A/B neg for influenza 04-01-16: glucose 139; bun 15.7; creat 1.16; k+ 3.1; na++ 144 04-07-16: glucose 140; bun 18.2; creat 1.37; k+ 4.1; na++ 143  04-17-16: wbc 5.0; hgb 11.1; hct 33.3; mcv 100.6; plt 119; glucose 178; bun 19; creat 1.22; k+ 3.7; na++ 141; liver normal albumin 3.8 04-18-16: wbc 4.0; hgb 9.3; hct 28.8; mcv 101.1; plt 93; glucose 124; bun 17; creat 1.20; k+ 2.9; na++ 141; liver normal albumin 3.2; mag 1.7 phos 3.7; tsh 2.572; hgb a1c 6.9 04-20-16: wbc 5.5; hgb 10.2; hct 30.9; mcv 100.0; plt 97; glucose 103; bun 14; create 1.00; k+ 3.7; na++ 143; mag 1.8  04-24-16:glucose 132; bun 17; creat 1.05; k+ 4.3; na++ 138  08-12-16: wb 4.0; hgb 10.7; hct 32.1 ;mcv 103.2; plt 95; glucose 111 bun 33.0; creat 1.57; k+ 4.4 ;na++ 143; liver normal albumin 3.5     Review of Systems  Constitutional: Negative for appetite change and fatigue.  HENT: Negative for congestion.   Respiratory: Negative for cough, chest tightness and shortness of breath.   Cardiovascular: Negative for chest pain, palpitations and leg swelling.  Gastrointestinal: Negative for nausea, abdominal pain, diarrhea and constipation.  Musculoskeletal: no complaint of muscle or joint pain    Skin: Negative for pallor.  Neurological: Negative for dizziness.  Psychiatric/Behavioral: The patient is not nervous/anxious.       Physical Exam  Constitutional: No distress.  Eyes: Conjunctivae are normal.  Neck: Neck supple. No JVD present. No thyromegaly present.    Cardiovascular: Normal rate, regular rhythm and intact distal pulses.   Respiratory: Effort normal and breath sounds normal. No respiratory distress. She has no wheezes.   GI: Soft. Bowel sounds normal slightly  distended no tenderness  Musculoskeletal: She exhibits no edema.  Able to move all extremities .   Lymphadenopathy:    She has no cervical adenopathy.  Neurological: She is alert.  Skin: Skin is warm and dry. She is not diaphoretic.  Psychiatric: She has a normal mood and affect.     ASSESSMENT/ PLAN:  1. Hypertension: b/p 123/60 will continue norvasc 2.5 mg daily coreg 3.15 mg daily   2. Chronic diastolic heart failure: is presently not on diuretic  Will not make changes will monitor   3. Duodenal ulcer: will continue protonix 40 mg  daily  Has zofran 8 mg every 8 hours as needed  Will continue pepcid 20 mg nightly to help with gerd symptoms and to avoid using PPI twice daily   4. UTI: will continue cranberry daily      5. Anemia: will continue iron daily  hgb is 10.2  6. Chronic afib: will continue coreg 3.125 mg daily for rate control. Will continue asa 81mg  daily .    7. Chronic respiratory failure: is 02 dependent. Has duoneb every 6 hours as needed   8. Allergic rhinitis: will continue flonase nightly and claritin 10 mg daily   9. Chronic pain due to osteoarthritis: will continue lidoderm to lower back daily; will continue baclofen 5 mg nightly for leg spasticity has vicodin 5/325 mg1/2 tab every 6 hours as needed for pain   10. Depression: is stable receives benefit form lexapro 10 mg daily   11. Chronic constipation  Has recurrent sigmoid volvulus: will continue colace 200 mg daily linzess 145 mcg daily; senna twice daily miralax daily   Her family has decided upon conservative treatment including repeat decompression by endoscope if needed.   12. Glaucoma: will continue  trusopt to both eyes three times daily; travatan to both eyes nightly   13. CKD stage III:  bun/creat 33.0/1.57  14. Hypokalemia: k+ 4.4; will continue k+ 40 meq daily    MD is aware of resident's narcotic use and is in agreement with current plan of care. We will attempt to wean resident as apropriate     Ok Edwards NP [facility]  Contact [phone] Monday through Friday 8am- 5pm  After hours call [phone]

## 2016-10-18 ENCOUNTER — Other Ambulatory Visit: Payer: Self-pay | Admitting: *Deleted

## 2016-10-18 ENCOUNTER — Encounter: Payer: Self-pay | Admitting: Adult Health

## 2016-10-18 MED ORDER — HYDROCODONE-ACETAMINOPHEN 5-325 MG PO TABS: ORAL_TABLET | ORAL | 0 refills | Status: DC

## 2016-10-18 NOTE — Telephone Encounter (Signed)
[facility] #[phone] Fax:[phone]  

## 2016-10-20 ENCOUNTER — Encounter: Payer: Self-pay | Admitting: Vascular Surgery

## 2016-11-01 ENCOUNTER — Ambulatory Visit (INDEPENDENT_AMBULATORY_CARE_PROVIDER_SITE_OTHER): Payer: Medicare Other | Admitting: Vascular Surgery

## 2016-11-01 ENCOUNTER — Ambulatory Visit (HOSPITAL_COMMUNITY)
Admission: RE | Admit: 2016-11-01 | Discharge: 2016-11-01 | Disposition: A | Discharge status: Home / Self Care | Payer: Medicare Other | Source: Ambulatory Visit | Attending: Vascular Surgery | Admitting: Vascular Surgery

## 2016-11-01 ENCOUNTER — Encounter: Payer: Self-pay | Admitting: Vascular Surgery

## 2016-11-01 VITALS — BP 127/68 | HR 66 | Temp 98.1°F | Resp 18 | Ht 67.0 in | Wt 167.0 lb

## 2016-11-01 DIAGNOSIS — M79604 Pain in right leg: Secondary | ICD-10-CM | POA: Diagnosis not present

## 2016-11-01 DIAGNOSIS — M79671 Pain in right foot: Secondary | ICD-10-CM

## 2016-11-01 NOTE — Progress Notes (Signed)
[facility] [place]  Patient name: Maria Christian MRN: [id number] DOB: [birth year] Sex: female  REASON FOR CONSULT: Evaluation pain in right foot  HPI: Maria Christian is a 83 y.o. female, who is here today for evaluation of right foot pain. She is here with her family. She is in a wheelchair. She reports pain that is persistent in her right foot and can be worse at night. She also has swelling in her right leg that is more progressive than in her left. Appears that her medical record documents is been present for at least 5 years. He had an outpatient venous study in April showed no evidence of DVT.Marland Kitchen She is here today to rule out arterial insufficiency as a cause of her pain. She has a very extensive past history. She reports that she had been walking some with physical therapy. She reports some issue approximately 2 years ago where she had pain in her left calf and has not been able to walk or stand transfer since that time. She has no lower extremity tissue loss.  Past Medical History:  Diagnosis Date  . Altered mental status   . Anemia   . Atrial fibrillation ([place])   . CKD (chronic kidney disease) stage 3, GFR 30-59 ml/min 02/27/2015  . Constipation 02/27/2015  . Coronary artery disease   . DEMENTIA   . Depression   . Diabetes mellitus   . Edema of lower extremity 07/13/11   right leg more swollen than left leg  . Encephalopathy   . Enlarged heart   . Esophageal dysmotility 07/02/12  . GERD (gastroesophageal reflux disease)   . History of adenomatous polyp of colon 06/24/99  . Horseshoe kidney   . Hyperlipemia   . Hypertension   . Pancreatic lesion 05/22/11   no further workup  per PCP/family due to age  . Parkinson disease ([place])   . Peripheral neuropathy   . Sigmoid volvulus ([place]) 05/24/2013  . Thrombophlebitis   . TIA (transient ischemic attack) 12/19/2012    Family History  Problem Relation Age of Onset  . Cancer  Unknown   . Heart disease Unknown     SOCIAL HISTORY: Social History   Social History  . Marital status: Widowed    Spouse name: N/A  . Number of children: 5  . Years of education: 10th   Occupational History  . Retired    Social History Main Topics  . Smoking status: Former Smoker    Quit date: 10/09/1964  . Smokeless tobacco: Never Used  . Alcohol use No  . Drug use: No  . Sexual activity: No   Other Topics Concern  . Not on file   Social History Narrative   Pt lives at [facility] and [place].   Caffeine Use: very small amount daily    Allergies  Allergen Reactions  . Aspirin Other (See Comments)    G.I. Upset only    Current Outpatient Prescriptions  Medication Sig Dispense Refill  . amLODipine (NORVASC) 2.5 MG tablet Take 2.5 mg by mouth every morning. Hold for BP < 100/60 or HR < 60    . aspirin EC 81 MG tablet Take 81 mg by mouth daily.    . baclofen (LIORESAL) 10 MG tablet Take 5 mg by mouth at bedtime.    . benzocaine-menthol (CHLORASEPTIC) 6-10 MG lozenge Take 1 lozenge by mouth as needed for sore throat.    . carvedilol (COREG) 3.125 MG tablet Take 3.125  mg by mouth daily.    . cholecalciferol (VITAMIN D) 1000 UNITS tablet Take 1,000 Units by mouth every morning.     . Cranberry 475 MG CAPS Take 1 capsule by mouth 2 (two) times daily.     Marland Kitchen docusate sodium (COLACE) 100 MG capsule Take 200 mg by mouth at bedtime.     . dorzolamide (TRUSOPT) 2 % ophthalmic solution Place 1 drop into both eyes 3 (three) times daily. Reported on 11/16/2015        0800,1400,2000    . escitalopram (LEXAPRO) 10 MG tablet Take 10 mg by mouth daily.    . famotidine (PEPCID) 20 MG tablet Take 20 mg by mouth at bedtime.    . Feeding Supplies MISC by Does not apply route. House 2.0 Med Pass - 120 cc by mouth  two times daily    . ferrous sulfate 220 (44 Fe) MG/5ML solution Take 220 mg by mouth daily.    . fluticasone (FLONASE) 50 MCG/ACT nasal spray Place 2 sprays into both  nostrils at bedtime.    Marland Kitchen HYDROcodone-acetaminophen (NORCO/VICODIN) 5-325 MG tablet Take one-half (1/2) tablet by mouth every 6 hours as needed for pain 60 tablet 0  . ipratropium-albuterol (DUONEB) 0.5-2.5 (3) MG/3ML SOLN Take 3 mLs by nebulization daily as needed.    . lidocaine (LIDODERM) 5 % Place 1 patch onto the skin daily. Apply 1 patch to left lower back/hip every morning.  Remove & Discard patch within 12 hours or as directed by MD 5 patch 0  . Linaclotide (LINZESS) 145 MCG CAPS capsule Take 145 mcg by mouth daily before breakfast.     . loratadine (CLARITIN) 10 MG tablet Take 10 mg by mouth daily before breakfast.     . Multiple Vitamins-Minerals (CERTAGEN PO) Take 1 tablet by mouth daily.     . ondansetron (ZOFRAN) 8 MG tablet Take 8 mg by mouth every 8 (eight) hours as needed for nausea or vomiting.    . OXYGEN Inhale 2 L into the lungs continuous.    . polyethylene glycol (MIRALAX / GLYCOLAX) packet Take 17 g by mouth daily at 6 (six) AM.     . Potassium Chloride ER 20 MEQ TBCR Take 40 mEq by mouth daily.    Marland Kitchen PROTONIX 40 MG PACK Take 40 mg by mouth daily at 6 (six) AM.    . senna (SENOKOT) 8.6 MG TABS tablet Take 1 tablet (8.6 mg total) by mouth 2 (two) times daily. 120 each 0  . Skin Protectants, Misc. (CALAZIME SKIN PROTECTANT EX) Apply 1 application topically daily. Buttocks protection    . sodium chloride (OCEAN) 0.65 % SOLN nasal spray Place 1 spray into both nostrils as directed. Four times daily And every 24 hours as needed to moisturize nasal passages.    . torsemide (DEMADEX) 20 MG tablet Take 20 mg by mouth daily.    . Travoprost, BAK Free, (TRAVATAN) 0.004 % SOLN ophthalmic solution Place 1 drop into both eyes daily at 8 pm.     . vitamin B-12 (CYANOCOBALAMIN) 500 MCG tablet Take 500 mcg by mouth every evening.      No current facility-administered medications for this visit.     REVIEW OF SYSTEMS:  [X]  denotes positive finding, [ ]  denotes negative finding Cardiac   Comments:  Chest pain or chest pressure:    Shortness of breath upon exertion:    Short of breath when lying flat:    Irregular heart rhythm:  Vascular    Pain in calf, thigh, or hip brought on by ambulation:    Pain in feet at night that wakes you up from your sleep:     Blood clot in your veins:    Leg swelling:  x       Pulmonary    Oxygen at home:    Productive cough:     Wheezing:         Neurologic    Sudden weakness in arms or legs:     Sudden numbness in arms or legs:     Sudden onset of difficulty speaking or slurred speech:    Temporary loss of vision in one eye:     Problems with dizziness:         Gastrointestinal    Blood in stool:     Vomited blood:         Genitourinary    Burning when urinating:     Blood in urine:        Psychiatric    Major depression:         Hematologic    Bleeding problems:    Problems with blood clotting too easily:        Skin    Rashes or ulcers:        Constitutional    Fever or chills:      PHYSICAL EXAM: Vitals:   11/01/16 1115  BP: 127/68  Pulse: 66  Resp: 18  Temp: 98.1 F (36.7 C)  TempSrc: Oral  SpO2: 99%  Weight: 167 lb (75.8 kg)  Height: 5\' 7"  (1.702 m)    GENERAL: The patient is a well-nourished female, in no acute distress. The vital signs are documented above. CARDIOVASCULAR: Palpable radial pulses. No palpable pedal pulses. Does have pitting edema in her right leg from her knee distally and 9 in her left. PULMONARY: There is good air exchange  ABDOMEN: Soft and non-tender  MUSCULOSKELETAL: There are no major deformities or cyanosis. NEUROLOGIC: No focal weakness or paresthesias are detected. SKIN: There are no ulcers or rashes noted. PSYCHIATRIC: The patient has a normal affect.  DATA:  Noninvasive arterial studies in our office today revealed normal ankle arm index on the right which are falsely elevated due to calcified vessel. She does have triphasic dorsalis pedis and biphasic  posterior tibial signal. She has monophasic signals in her left foot.  MEDICAL ISSUES: Had long discussion with the patient and family present. She does have some calcified vessels in her lower shin disease but has very good arterial flow. I interrogated her vessels with hand-held Doppler she has great Doppler flow to her feet bilaterally. I expanded this rules out arterial insufficiency as a cause for pain. I would recommend elevation when possible regarding her leg swelling. She could wear compression garments for improvement in the swelling in her right leg. I suspect that this would cause her more discomfort than improvement however. She will see [name] again on an as-needed basis   Rosetta Posner, MD [facility] Tel [phone] Pager [phone]

## 2016-11-08 ENCOUNTER — Encounter: Payer: Self-pay | Admitting: Internal Medicine

## 2016-11-14 ENCOUNTER — Non-Acute Institutional Stay (SKILLED_NURSING_FACILITY): Payer: Medicare Other | Admitting: Adult Health

## 2016-11-14 ENCOUNTER — Encounter: Payer: Self-pay | Admitting: Adult Health

## 2016-11-14 DIAGNOSIS — F0280 Dementia in other diseases classified elsewhere without behavioral disturbance: Secondary | ICD-10-CM | POA: Diagnosis not present

## 2016-11-14 DIAGNOSIS — I5032 Chronic diastolic (congestive) heart failure: Secondary | ICD-10-CM | POA: Diagnosis not present

## 2016-11-14 DIAGNOSIS — J9611 Chronic respiratory failure with hypoxia: Secondary | ICD-10-CM

## 2016-11-14 DIAGNOSIS — I482 Chronic atrial fibrillation, unspecified: Secondary | ICD-10-CM

## 2016-11-14 DIAGNOSIS — I110 Hypertensive heart disease with heart failure: Secondary | ICD-10-CM | POA: Diagnosis not present

## 2016-11-14 DIAGNOSIS — N183 Chronic kidney disease, stage 3 unspecified: Secondary | ICD-10-CM

## 2016-11-14 DIAGNOSIS — D638 Anemia in other chronic diseases classified elsewhere: Secondary | ICD-10-CM

## 2016-11-14 DIAGNOSIS — K269 Duodenal ulcer, unspecified as acute or chronic, without hemorrhage or perforation: Secondary | ICD-10-CM | POA: Diagnosis not present

## 2016-11-14 NOTE — Progress Notes (Signed)
Location:   [facility] Room Number: 332 A Place of Service:  SNF (31)   CODE STATUS: DNR  Allergies  Allergen Reactions  . Aspirin Other (See Comments)    [name]    Chief Complaint  Patient presents with  . Medical Management of Chronic Issues    1 month follow up    HPI:  She is a long term resident of this facility being seen for the management of her chronic illnesses . she is presently stable. She does get out of bed nearly every day. She is not voicing any complaints at this time. There are no nursing concerns at this time.    Past Medical History:  Diagnosis Date  . Altered mental status   . Anemia   . Atrial fibrillation ([place])   . CKD (chronic kidney disease) stage 3, GFR 30-59 ml/min 02/27/2015  . Constipation 02/27/2015  . Coronary artery disease   . DEMENTIA   . Depression   . Diabetes mellitus   . Edema of lower extremity 07/13/11   right leg more swollen than left leg  . Encephalopathy   . Enlarged heart   . Esophageal dysmotility 07/02/12  . GERD (gastroesophageal reflux disease)   . History of adenomatous polyp of colon 06/24/99  . Horseshoe kidney   . Hyperlipemia   . Hypertension   . Pancreatic lesion 05/22/11   no further workup  per PCP/family due to age  . Parkinson disease ([place])   . Peripheral neuropathy   . Sigmoid volvulus ([place]) 05/24/2013  . Thrombophlebitis   . TIA (transient ischemic attack) 12/19/2012    Past Surgical History:  Procedure Laterality Date  . ABDOMINAL HYSTERECTOMY    . BOWEL DECOMPRESSION N/A 04/18/2016   Procedure: BOWEL DECOMPRESSION;  Surgeon: [name], MD;  [facility]: [facility];  Service: Endoscopy;  Laterality: N/A;  . BREAST SURGERY     2 benign tumors removed left breast  . CHOLECYSTECTOMY    . ESOPHAGEAL DILATION     several times by Dr. [name]  . ESOPHAGOGASTRODUODENOSCOPY (EGD) WITH ESOPHAGEAL DILATION N/A 08/01/2012   Procedure: ESOPHAGOGASTRODUODENOSCOPY (EGD) WITH  ESOPHAGEAL DILATION;  Surgeon: [name], MD;  [facility];  Service: Endoscopy;  Laterality: N/A;  with c-arm savory dilators  . ESOPHAGOGASTRODUODENOSCOPY (EGD) WITH PROPOFOL N/A 03/12/2015   Procedure: ESOPHAGOGASTRODUODENOSCOPY (EGD) WITH PROPOFOL;  Surgeon: [name], MD;  [facility];  Service: Endoscopy;  Laterality: N/A;  . FLEXIBLE SIGMOIDOSCOPY N/A 05/24/2013   Procedure: FLEXIBLE SIGMOIDOSCOPY;  Surgeon: [name], MD;  [facility];  Service: Endoscopy;  Laterality: N/A;  . FLEXIBLE SIGMOIDOSCOPY N/A 05/26/2013   Procedure:  flex with decompression of sigmoid volvulus;  Surgeon: [name], MD;  [facility];  Service: Endoscopy;  Laterality: N/A;  . FLEXIBLE SIGMOIDOSCOPY N/A 12/20/2015   Procedure: FLEXIBLE SIGMOIDOSCOPY;  Surgeon: Milus Banister, MD;  [facility];  Service: Endoscopy;  Laterality: N/A;  . FLEXIBLE SIGMOIDOSCOPY N/A 04/17/2016   Procedure: FLEXIBLE SIGMOIDOSCOPY;  Surgeon: Ladene Artist, MD;  [facility];  Service: Endoscopy;  Laterality: N/A;  . FLEXIBLE SIGMOIDOSCOPY N/A 04/18/2016   Procedure: FLEXIBLE SIGMOIDOSCOPY;  Surgeon: [name], MD;  [facility];  Service: Endoscopy;  Laterality: N/A;  . FOOT SURGERY     benign tumors from foot  . NOSE SURGERY    . SHOULDER SURGERY  2001   left clavicle excision and acromioplasty  . TOTAL HIP ARTHROPLASTY  Social History   Social History  . Marital status: Widowed    Spouse name: N/A  . Number of children: 5  . Years of education: 10th   Occupational History  . Retired    Social History Main Topics  . Smoking status: Former Smoker    Quit date: 10/09/1964  . Smokeless tobacco: Never Used  . Alcohol use No  . Drug use: No  . Sexual activity: No   Other Topics Concern  . Not on file   Social History Narrative   Pt lives at [facility] and [place].   Caffeine Use: very small amount daily    Family History  Problem Relation Age of Onset  . Cancer Unknown   . Heart disease Unknown       VITAL SIGNS BP (!) 114/53   Pulse 74   Temp 97.6 F (36.4 C)   Resp 18   Ht 5\' 7"  (1.702 m)   Wt 165 lb (74.8 kg)   SpO2 97%   BMI 25.84 kg/m   Patient's Medications  New Prescriptions   No medications on file  Previous Medications   AMLODIPINE (NORVASC) 2.5 MG TABLET    Take 2.5 mg by mouth every morning. Hold for BP < 100/60 or HR < 60   ASPIRIN EC 81 MG TABLET    Take 81 mg by mouth daily.   BACLOFEN (LIORESAL) 10 MG TABLET    Take 5 mg by mouth at bedtime.   BENZOCAINE-MENTHOL (CHLORASEPTIC) 6-10 MG LOZENGE    Take 1 lozenge by mouth as needed for sore throat.   BIOTIN PO    Give 1 tablet by mouth one time a day for nutrition   CARVEDILOL (COREG) 3.125 MG TABLET    Take 3.125 mg by mouth daily.   CHOLECALCIFEROL (VITAMIN D) 1000 UNITS TABLET    Take 1,000 Units by mouth every morning.    CRANBERRY 475 MG CAPS    Take 1 capsule by mouth 2 (two) times daily.    DOCUSATE SODIUM (COLACE) 100 MG CAPSULE    Take 200 mg by mouth at bedtime.    ESCITALOPRAM (LEXAPRO) 10 MG TABLET    Take 10 mg by mouth daily.   FAMOTIDINE (PEPCID) 20 MG TABLET    Take 20 mg by mouth at bedtime.   FEEDING SUPPLIES MISC    by Does not apply route. House 2.0 Med Pass - 120 cc by mouth  two times daily   FERROUS SULFATE 220 (44 FE) MG/5ML SOLUTION    Give 5 ml by mouth in the evening for supplement   FLUTICASONE (FLONASE) 50 MCG/ACT NASAL SPRAY    Place 2 sprays into both nostrils at bedtime.   HYDROCODONE-ACETAMINOPHEN (NORCO/VICODIN) 5-325 MG TABLET    Take one-half (1/2) tablet by mouth every 6 hours as needed for pain   IPRATROPIUM-ALBUTEROL (DUONEB) 0.5-2.5 (3) MG/3ML SOLN    Take 3 mLs by nebulization daily as needed.   LIDOCAINE (LIDODERM) 5 %    Place 1 patch onto the skin daily. Apply 1 patch to left lower back/hip every morning.  Remove & Discard patch within 12 hours or as directed by MD    LINACLOTIDE (LINZESS) 145 MCG CAPS CAPSULE    Take 145 mcg by mouth daily before breakfast.    LORATADINE (CLARITIN) 10 MG TABLET    Take 10 mg by mouth daily before breakfast.    MULTIPLE VITAMINS-MINERALS (CERTAGEN PO)    Take 1 tablet by mouth daily.  OMEPRAZOLE (PRILOSEC) 20 MG CAPSULE    Take 20 mg by mouth daily.   ONDANSETRON (ZOFRAN) 8 MG TABLET    Take 8 mg by mouth every 8 (eight) hours as needed for nausea or vomiting.   OXYGEN    Inhale 2 L into the lungs continuous.   POLYETHYLENE GLYCOL (MIRALAX / GLYCOLAX) PACKET    Take 17 g by mouth daily at 6 (six) AM.    POTASSIUM CHLORIDE ER 20 MEQ TBCR    Take 40 mEq by mouth daily.   SENNA (SENOKOT) 8.6 MG TABS TABLET    Take 1 tablet (8.6 mg total) by mouth 2 (two) times daily.   SKIN PROTECTANTS, MISC. (CALAZIME SKIN PROTECTANT EX)    Apply 1 application topically daily. Buttocks protection   SODIUM CHLORIDE (OCEAN) 0.65 % SOLN NASAL SPRAY    Place 1 spray into both nostrils as directed. Four times daily And every 24 hours as needed to moisturize nasal passages.   TORSEMIDE (DEMADEX) 20 MG TABLET    Take 20 mg by mouth daily.   TRAVOPROST, BAK FREE, (TRAVATAN) 0.004 % SOLN OPHTHALMIC SOLUTION    Place 1 drop into both eyes daily at 8 pm.    VITAMIN B-12 (CYANOCOBALAMIN) 500 MCG TABLET    Take 500 mcg by mouth every evening.   Modified Medications   No medications on file  Discontinued Medications   DORZOLAMIDE (TRUSOPT) 2 % OPHTHALMIC SOLUTION    Place 1 drop into both eyes 3 (three) times daily. Reported on 11/16/2015        0800,1400,2000   PROTONIX 40 MG PACK    Take 40 mg by mouth daily at 6 (six) AM.     SIGNIFICANT DIAGNOSTIC EXAMS  02-15-15: abdominal ultrasound: 1. Gallbladder not visualized. 2. No hydronephrosis no renal calculus; 3. The pancreas not well visualized; which could be related to gassy abdomen but an appearance which might represent pancreatitis in the appropriate clinical setting.   03-12-15: upper endoscopy:  duodenal bulb 1.5-2.0 cm ulcer with clot at base. No active bleeding. Surrounding mucosa was edematous. Biopsy of the surrounding mucosa were done.   12-20-15: ct of abdomen and pelvis: 1. Loosely twisted sigmoid volvulus with transition point. Proximal distention is improved compared to radiography earlier today suggesting partial interval decompression. 2. Chronic findings are stable and described above.  12-20-15: flex sigmoidoscopy: chronic intermittent sigmoid volvulus. The site of volvulus was only slightly twisted during this exam, easily passed with colonoscope and the air filled dilated colon proximal to it was suctioned extensively. Her abdomen was flat, normal after the exam.   02-19-16: right lower extremity doppler: neg dvt  02-23-16: renal ultrasound: normal right kidney slightly small left kidney. Right 1.9 cm cyst no hydronephrosis or stones   03-19-16: chest x-ray: modest cardiomegaly with mild pulmonary venous congestion   09-05-16: right lower extremity doppler: DVT: neg    LABS REVIEWED:     12-20-15: wbc 5.2; hgb 11.0; hct 32.4; mcv 101.3; plt 107; glucose 138; bun 31; creat 1.61; k+ 3.2; na++ 138; liver normal albumin 3.9; urine culture: e-coli: rocephin 12-21-15: wbc 3.9; hgb 9.8; hct 29.7; mcv 103.1; plt 98; glucose 108; bun 23; creat 1.15; k+ 3.2; na++ 143; mag 2.4 12-22-15: wbc 5.1; hgb 10.8; hct 32.3; mcv 103.2; plt 101; glucose 125; bun 18; creat 1.09; k+ 3.9; na++ 142  01-04-16: wbc 4.1; hgb 10.9; hct 32.4; mcv 100.5; plt 175; glucose 121; bun 13.2; creat 1.18; k+ 3.8; na++ 144; liver normal  albumin 3.8 01-31-16; urine culture: e-coli: ESBL invanz 02-15-16: wbc 4.6; hgb 11.3; hct 36.3; mcv 102.7; plt 128; glucose 160; bun 15.3; creat 1.65; k+ 4.5; na++ 143; liver normal albumin 3.9 02-19-16: glucose 116; bun 28.7; mcv 1.72; k+ 4.5; na++ 143 02-23-16; wbc 3.5 hgb 9.7; hct 30.4; mcv 101.8; plt 126; glucose 118; bun 25.6; creat 1.52; k+ 4.6; na++ 144  03-11-16: glucose 196; bun  18.4; creat 1.28; k+ 3.7; na++ 141  03-19-16: A/B neg for influenza 04-01-16: glucose 139; bun 15.7; creat 1.16; k+ 3.1; na++ 144 04-07-16: glucose 140; bun 18.2; creat 1.37; k+ 4.1; na++ 143  04-17-16: wbc 5.0; hgb 11.1; hct 33.3; mcv 100.6; plt 119; glucose 178; bun 19; creat 1.22; k+ 3.7; na++ 141; liver normal albumin 3.8 04-18-16: wbc 4.0; hgb 9.3; hct 28.8; mcv 101.1; plt 93; glucose 124; bun 17; creat 1.20; k+ 2.9; na++ 141; liver normal albumin 3.2; mag 1.7 phos 3.7; tsh 2.572; hgb a1c 6.9 04-20-16: wbc 5.5; hgb 10.2; hct 30.9; mcv 100.0; plt 97; glucose 103; bun 14; create 1.00; k+ 3.7; na++ 143; mag 1.8  04-24-16:glucose 132; bun 17; creat 1.05; k+ 4.3; na++ 138  08-12-16: wb 4.0; hgb 10.7; hct 32.1 ;mcv 103.2; plt 95; glucose 111 bun 33.0; creat 1.57; k+ 4.4 ;na++ 143; liver normal albumin 3.5     Review of Systems  Constitutional: Negative for appetite change and fatigue.  HENT: Negative for congestion.   Respiratory: Negative for cough, chest tightness and shortness of breath.   Cardiovascular: Negative for chest pain, palpitations and leg swelling.  Gastrointestinal: Negative for nausea, abdominal pain, diarrhea and constipation.  Musculoskeletal: no complaint of muscle or joint pain    Skin: Negative for pallor.  Neurological: Negative for dizziness.  Psychiatric/Behavioral: The patient is not nervous/anxious.       Physical Exam  Constitutional: No distress.  Eyes: Conjunctivae are normal.  Neck: Neck supple. No JVD present. No thyromegaly present.  Cardiovascular: Normal rate, regular rhythm and intact distal pulses.   Respiratory: Effort normal and breath sounds normal. No respiratory distress. She has no wheezes.   GI: Soft. Bowel sounds normal slightly  distended no tenderness  Musculoskeletal: She exhibits no edema.  Able to move all extremities .   Lymphadenopathy:    She has no cervical adenopathy.  Neurological: She is alert.  Skin: Skin is warm and dry.  She is not diaphoretic.  Psychiatric: She has a normal mood and affect.     ASSESSMENT/ PLAN:  1. Hypertension: b/p 114/53  will continue norvasc 2.5 mg daily coreg 3.15 mg daily   2. Chronic diastolic heart failure: is presently not on diuretic  Will not make changes will monitor   3. Duodenal ulcer: will continue protonix 40 mg  daily  Has zofran 8 mg every 8 hours as needed  Will continue pepcid 20 mg nightly to help with gerd symptoms and to avoid using PPI twice daily   4. UTI: will continue cranberry twice daily      5. Anemia: will continue iron daily hgb is 10.2  6. Chronic afib: will continue coreg 3.125 mg daily for rate control. Will continue asa 81mg  daily .    7. Chronic respiratory failure: is 02 dependent. Has duoneb every 6 daily  as needed   8. Allergic rhinitis: will continue flonase nightly and claritin 10 mg daily   9. Chronic pain due to osteoarthritis: will continue lidoderm to lower back daily; will continue baclofen 5 mg nightly for  leg spasticity has vicodin 5/325 mg1/2 tab every 6 hours as needed for pain   10. Depression: is stable receives benefit form lexapro 10 mg daily   11. Chronic constipation  Has recurrent sigmoid volvulus: will continue colace 200 mg daily linzess 145 mcg daily; senna twice daily miralax daily   Her family has decided upon conservative treatment including repeat decompression by endoscope if needed.   12. Glaucoma: will continue  trusopt to both eyes three times daily; travatan to both eyes nightly   13. CKD stage III: bun/creat 33.0/1.57  14. Hypokalemia: k+ 4.4; will continue k+ 40 meq daily   MD is aware of resident's narcotic use and is in agreement with current plan of care. We will attempt to wean resident as apropriate     Ok Edwards NP [facility]  Contact [phone] Monday through Friday 8am- 5pm  After hours call [phone]

## 2016-11-23 ENCOUNTER — Encounter: Payer: Self-pay | Admitting: Internal Medicine

## 2016-11-23 ENCOUNTER — Non-Acute Institutional Stay (SKILLED_NURSING_FACILITY): Payer: Medicare Other

## 2016-11-23 ENCOUNTER — Non-Acute Institutional Stay (SKILLED_NURSING_FACILITY): Payer: Medicare Other | Admitting: Internal Medicine

## 2016-11-23 DIAGNOSIS — F0280 Dementia in other diseases classified elsewhere without behavioral disturbance: Secondary | ICD-10-CM | POA: Diagnosis not present

## 2016-11-23 DIAGNOSIS — M150 Primary generalized (osteo)arthritis: Secondary | ICD-10-CM

## 2016-11-23 DIAGNOSIS — Z0000 Encounter for general adult medical examination without abnormal findings: Secondary | ICD-10-CM

## 2016-11-23 DIAGNOSIS — M159 Polyosteoarthritis, unspecified: Secondary | ICD-10-CM

## 2016-11-23 DIAGNOSIS — M7989 Other specified soft tissue disorders: Secondary | ICD-10-CM

## 2016-11-23 DIAGNOSIS — L608 Other nail disorders: Secondary | ICD-10-CM | POA: Diagnosis not present

## 2016-11-23 NOTE — Progress Notes (Signed)
Subjective:   Maria Christian is a 83 y.o. female who presents for Medicare Annual (Subsequent) preventive examination at [facility]    Objective:     Vitals: BP 118/60 (BP Location: Right Arm, Patient Position: Supine)   Pulse 82   Temp 97.6 F (36.4 C) (Oral)   Ht 5\' 7"  (1.702 m)   Wt 165 lb (74.8 kg)   SpO2 97%   BMI 25.84 kg/m   Body mass index is 25.84 kg/m.   Tobacco History  Smoking Status  . Never Smoker  Smokeless Tobacco  . Never Used     Counseling given: Not Answered   Past Medical History:  Diagnosis Date  . Altered mental status   . Anemia   . Atrial fibrillation ([place])   . CKD (chronic kidney disease) stage 3, GFR 30-59 ml/min 02/27/2015  . Constipation 02/27/2015  . Coronary artery disease   . DEMENTIA   . Depression   . Diabetes mellitus   . Edema of lower extremity 07/13/11   right leg more swollen than left leg  . Encephalopathy   . Enlarged heart   . Esophageal dysmotility 07/02/12  . GERD (gastroesophageal reflux disease)   . History of adenomatous polyp of colon 06/24/99  . Horseshoe kidney   . Hyperlipemia   . Hypertension   . Pancreatic lesion 05/22/11   no further workup  per PCP/family due to age  . Parkinson disease ([place])   . Peripheral neuropathy   . Sigmoid volvulus ([place]) 05/24/2013  . Thrombophlebitis   . TIA (transient ischemic attack) 12/19/2012   Past Surgical History:  Procedure Laterality Date  . ABDOMINAL HYSTERECTOMY    . BOWEL DECOMPRESSION N/A 04/18/2016   Procedure: BOWEL DECOMPRESSION;  Surgeon: [name], MD;  [facility];  Service: Endoscopy;  Laterality: N/A;  . BREAST SURGERY     2 benign tumors removed left breast  . CHOLECYSTECTOMY    . ESOPHAGEAL DILATION     several times by Dr. [name]  . ESOPHAGOGASTRODUODENOSCOPY (EGD) WITH ESOPHAGEAL DILATION N/A 08/01/2012   Procedure: ESOPHAGOGASTRODUODENOSCOPY (EGD) WITH ESOPHAGEAL DILATION;  Surgeon: [name], MD;  [facility];  Service: Endoscopy;  Laterality: N/A;  with c-arm savory dilators  . ESOPHAGOGASTRODUODENOSCOPY (EGD) WITH PROPOFOL N/A 03/12/2015   Procedure: ESOPHAGOGASTRODUODENOSCOPY (EGD) WITH PROPOFOL;  Surgeon: [name], MD;  [facility];  Service: Endoscopy;  Laterality: N/A;  . FLEXIBLE SIGMOIDOSCOPY N/A 05/24/2013   Procedure: FLEXIBLE SIGMOIDOSCOPY;  Surgeon: [name], MD;  [facility];  Service: Endoscopy;  Laterality: N/A;  . FLEXIBLE SIGMOIDOSCOPY N/A 05/26/2013   Procedure:  flex with decompression of sigmoid volvulus;  Surgeon: [name], MD;  [facility];  Service: Endoscopy;  Laterality: N/A;  . FLEXIBLE SIGMOIDOSCOPY N/A 12/20/2015   Procedure: FLEXIBLE SIGMOIDOSCOPY;  Surgeon: Milus Banister, MD;  [facility];  Service: Endoscopy;  Laterality: N/A;  . FLEXIBLE SIGMOIDOSCOPY N/A 04/17/2016   Procedure: FLEXIBLE SIGMOIDOSCOPY;  Surgeon: Ladene Artist, MD;  [facility];  Service: Endoscopy;  Laterality: N/A;  . FLEXIBLE SIGMOIDOSCOPY N/A 04/18/2016   Procedure: FLEXIBLE SIGMOIDOSCOPY;  Surgeon: [name], MD;  [facility];  Service: Endoscopy;  Laterality: N/A;  . FOOT SURGERY     benign tumors from foot  . NOSE SURGERY    . SHOULDER SURGERY  2001   left clavicle excision and acromioplasty  . TOTAL HIP ARTHROPLASTY     Family  History  Problem Relation Age of Onset  . Cancer Unknown   . Heart disease Unknown    History  Sexual Activity  . Sexual activity: No    Outpatient Encounter Prescriptions as of 11/23/2016  Medication Sig  . amLODipine (NORVASC) 2.5 MG tablet Take 2.5 mg by mouth every morning. Hold for BP < 100/60 or HR < 60  . aspirin EC 81 MG tablet Take 81 mg by mouth daily.  . baclofen (LIORESAL) 10 MG tablet Take 5 mg by mouth at bedtime.  . benzocaine-menthol (CHLORASEPTIC) 6-10 MG lozenge Take 1 lozenge by mouth as needed for sore throat.  Marland Kitchen BIOTIN PO Give 1 tablet by  mouth one time a day for nutrition  . carvedilol (COREG) 3.125 MG tablet Take 3.125 mg by mouth daily.  . cholecalciferol (VITAMIN D) 1000 UNITS tablet Take 1,000 Units by mouth every morning.   . Cranberry 475 MG CAPS Take 1 capsule by mouth 2 (two) times daily.   Marland Kitchen docusate sodium (COLACE) 100 MG capsule Take 200 mg by mouth at bedtime.   Marland Kitchen escitalopram (LEXAPRO) 10 MG tablet Take 10 mg by mouth daily.  . famotidine (PEPCID) 20 MG tablet Take 20 mg by mouth at bedtime.  . Feeding Supplies MISC by Does not apply route. House 2.0 Med Pass - 120 cc by mouth  two times daily  . ferrous sulfate 220 (44 Fe) MG/5ML solution Give 5 ml by mouth in the evening for supplement  . fluticasone (FLONASE) 50 MCG/ACT nasal spray Place 2 sprays into both nostrils at bedtime.  Marland Kitchen HYDROcodone-acetaminophen (NORCO/VICODIN) 5-325 MG tablet Take one-half (1/2) tablet by mouth every 6 hours as needed for pain  . ipratropium-albuterol (DUONEB) 0.5-2.5 (3) MG/3ML SOLN Take 3 mLs by nebulization daily as needed.  . lidocaine (LIDODERM) 5 % Place 1 patch onto the skin daily. Apply 1 patch to left lower back/hip every morning.  Remove & Discard patch within 12 hours or as directed by MD  . Linaclotide (LINZESS) 145 MCG CAPS capsule Take 145 mcg by mouth daily before breakfast.   . loratadine (CLARITIN) 10 MG tablet Take 10 mg by mouth daily before breakfast.   . Multiple Vitamins-Minerals (CERTAGEN PO) Take 1 tablet by mouth daily.   Marland Kitchen omeprazole (PRILOSEC) 20 MG capsule Take 20 mg by mouth daily.  . ondansetron (ZOFRAN) 8 MG tablet Take 8 mg by mouth every 8 (eight) hours as needed for nausea or vomiting.  . polyethylene glycol (MIRALAX / GLYCOLAX) packet Take 17 g by mouth daily at 6 (six) AM.   . Potassium Chloride ER 20 MEQ TBCR Take 40 mEq by mouth daily.  Marland Kitchen senna (SENOKOT) 8.6 MG TABS tablet Take 1 tablet (8.6 mg total) by mouth 2 (two) times daily.  . Skin Protectants, Misc. (CALAZIME SKIN PROTECTANT EX) Apply 1  application topically daily. Buttocks protection  . sodium chloride (OCEAN) 0.65 % SOLN nasal spray Place 1 spray into both nostrils as directed. Four times daily And every 24 hours as needed to moisturize nasal passages.  . torsemide (DEMADEX) 20 MG tablet Take 20 mg by mouth daily.  . Travoprost, BAK Free, (TRAVATAN) 0.004 % SOLN ophthalmic solution Place 1 drop into both eyes daily at 8 pm.   . vitamin B-12 (CYANOCOBALAMIN) 500 MCG tablet Take 500 mcg by mouth every evening.   . [DISCONTINUED] OXYGEN Inhale 2 L into the lungs continuous.   No facility-administered encounter medications on file as of 11/23/2016.  Activities of Daily Living In your present state of health, do you have any difficulty performing the following activities: 11/23/2016 04/17/2016  Hearing? [place]? Y -  Difficulty concentrating or making decisions? Y -  Walking or climbing stairs? Y -  Dressing or bathing? Y -  Doing errands, shopping? [place]  Preparing Food and eating ? Y -  Using the Toilet? Y -  In the past six months, have you accidently leaked urine? Y -  Do you have problems with loss of bowel control? Y -  Managing your Medications? Y -  Managing your Finances? Y -  Housekeeping or managing your Housekeeping? Y -  Some recent data might be hidden    Patient Care Team: [name], DO as PCP - General (Internal Medicine) Nyoka Cowden Phylis Bougie, NP as Nurse Practitioner (Nurse Practitioner)    Assessment:    Exercise Activities and Dietary recommendations Current Exercise Habits: The patient does not participate in regular exercise at present, Exercise limited by: orthopedic condition(s)  Goals    . Maintain Lifestyle          Pt will maintain lifestyle.       Fall Risk Fall Risk  11/23/2016  Falls in the past year? No   Depression Screen PHQ 2/9 Scores 11/23/2016  PHQ - 2 Score 0     Cognitive Function     6CIT Screen 11/23/2016  What Year? 4 points  What month? 0 points  What  time? 0 points  Count back from 20 0 points  Months in reverse 0 points  Repeat phrase 0 points  Total Score 4    Immunization History  Administered Date(s) Administered  . Influenza-Unspecified 07/13/2015, 03/09/2016  . PPD Test 07/28/2013, 02/18/2016, 02/25/2016   Screening Tests Health Maintenance  Topic Date Due  . PNA vac Low Risk Adult (1 of 2 - PCV13) 10/09/1991  . URINE MICROALBUMIN  08/10/2017 (Originally 10/08/1934)  . HEMOGLOBIN A1C  10/17/2017 (Originally 10/16/2016)  . TETANUS/TDAP  04/06/2038 (Originally 10/08/1943)  . INFLUENZA VACCINE  01/03/2017  . FOOT EXAM  01/06/2017  . OPHTHALMOLOGY EXAM  09/18/2017  . DEXA SCAN  Excluded      Plan:    I have personally reviewed and addressed the Medicare Annual Wellness questionnaire and have noted the following in the patient's chart:  A. Medical and social history B. Use of alcohol, tobacco or illicit drugs  C. Current medications and supplements D. Functional ability and status E.  Nutritional status F.  Physical activity G. Advance directives H. List of other physicians I.  Hospitalizations, surgeries, and ER visits in previous 12 months J.  [place] to include hearing, vision, cognitive, depression L. Referrals and appointments - none  In addition, I have reviewed and discussed with patient certain preventive protocols, quality metrics, and best practice recommendations. A written personalized care plan for preventive services as well as general preventive health recommendations were provided to patient.  See attached scanned questionnaire for additional information.   Signed,   Rich Reining, RN Nurse Health Advisor   Quick Notes   Health Maintenance: TDAP, PNA 13 and DEXA due     Abnormal Screen: 6 CIT-4     Patient Concerns: R leg is swollen and pink, +1 edema, R middle toenail is growing into toe     Nurse Concerns: None

## 2016-11-23 NOTE — Patient Instructions (Signed)
Maria Christian , Thank you for taking time to come for your Medicare Wellness Visit. I appreciate your ongoing commitment to your health goals. Please review the following plan we discussed and let me know if I can assist you in the future.   Screening recommendations/referrals: Colonoscopy up to date, pt over age 81 Mammogram up to date, pt over age 81 Bone Density due Recommended yearly ophthalmology/optometry visit for glaucoma screening and checkup Recommended yearly dental visit for hygiene and checkup  Vaccinations: Influenza vaccine up to date. Due 03/09/80   Pneumococcal vaccine 13 due Tdap vaccine due Shingles vaccine not in records    Advanced directives: DNR in chart  Conditions/risks identified: None  Next appointment: Dr Eulas Post makes rounds   Preventive Care 81 Years and Older, Female Preventive care refers to lifestyle choices and visits with your health care provider that can promote health and wellness. What does preventive care include?  A yearly physical exam. This is also called an annual well check.  Dental exams once or twice a year.  Routine eye exams. Ask your health care provider how often you should have your eyes checked.  Personal lifestyle choices, including:  Daily care of your teeth and gums.  Regular physical activity.  Eating a healthy diet.  Avoiding tobacco and drug use.  Limiting alcohol use.  Practicing safe sex.  Taking low-dose aspirin every day.  Taking vitamin and mineral supplements as recommended by your health care provider. What happens during an annual well check? The services and screenings done by your health care provider during your annual well check will depend on your age, overall health, lifestyle risk factors, and family history of disease. Counseling  Your health care provider may ask you questions about your:  Alcohol use.  Tobacco use.  Drug use.  Emotional well-being.  Home and relationship  well-being.  Sexual activity.  Eating habits.  History of falls.  Memory and ability to understand (cognition).  Work and work Statistician.  Reproductive health. Screening  You may have the following tests or measurements:  Height, weight, and BMI.  Blood pressure.  Lipid and cholesterol levels. These may be checked every 5 years, or more frequently if you are over 56 years old.  Skin check.  Lung cancer screening. You may have this screening every year starting at age 41 if you have a 30-pack-year history of smoking and currently smoke or have quit within the past 15 years.  Fecal occult blood test (FOBT) of the stool. You may have this test every year starting at age 80.  Flexible sigmoidoscopy or colonoscopy. You may have a sigmoidoscopy every 5 years or a colonoscopy every 10 years starting at age 19.  Hepatitis C blood test.  Hepatitis B blood test.  Sexually transmitted disease (STD) testing.  Diabetes screening. This is done by checking your blood sugar (glucose) after you have not eaten for a while (fasting). You may have this done every 1-3 years.  Bone density scan. This is done to screen for osteoporosis. You may have this done starting at age 44.  Mammogram. This may be done every 1-2 years. Talk to your health care provider about how often you should have regular mammograms. Talk with your health care provider about your test results, treatment options, and if necessary, the need for more tests. Vaccines  Your health care provider may recommend certain vaccines, such as:  Influenza vaccine. This is recommended every year.  Tetanus, diphtheria, and acellular pertussis (Tdap, Td) vaccine.  You may need a Td booster every 10 years.  Zoster vaccine. You may need this after age 93.  Pneumococcal 13-valent conjugate (PCV13) vaccine. One dose is recommended after age 70.  Pneumococcal polysaccharide (PPSV23) vaccine. One dose is recommended after age  32. Talk to your health care provider about which screenings and vaccines you need and how often you need them. This information is not intended to replace advice given to you by your health care provider. Make sure you discuss any questions you have with your health care provider. Document Released: 06/18/2015 Document Revised: 02/09/2016 Document Reviewed: 03/23/2015 Elsevier Interactive Patient Education  2017 [place] Prevention in the Home Falls can cause injuries. They can happen to people of all ages. There are many things you can do to make your home safe and to help prevent falls. What can I do on the outside of my home?  Regularly fix the edges of walkways and driveways and fix any cracks.  Remove anything that might make you trip as you walk through a door, such as a raised step or threshold.  Trim any bushes or trees on the path to your home.  Use bright outdoor lighting.  Clear any walking paths of anything that might make someone trip, such as rocks or tools.  Regularly check to see if handrails are loose or broken. Make sure that both sides of any steps have handrails.  Any raised decks and porches should have guardrails on the edges.  Have any leaves, snow, or ice cleared regularly.  Use sand or salt on walking paths during winter.  Clean up any spills in your garage right away. This includes oil or grease spills. What can I do in the bathroom?  Use night lights.  Install grab bars by the toilet and in the tub and shower. Do not use towel bars as grab bars.  Use non-skid mats or decals in the tub or shower.  If you need to sit down in the shower, use a plastic, non-slip stool.  Keep the floor dry. Clean up any water that spills on the floor as soon as it happens.  Remove soap buildup in the tub or shower regularly.  Attach bath mats securely with double-sided non-slip rug tape.  Do not have throw rugs and other things on the floor that can make  you trip. What can I do in the bedroom?  Use night lights.  Make sure that you have a light by your bed that is easy to reach.  Do not use any sheets or blankets that are too big for your bed. They should not hang down onto the floor.  Have a firm chair that has side arms. You can use this for support while you get dressed.  Do not have throw rugs and other things on the floor that can make you trip. What can I do in the kitchen?  Clean up any spills right away.  Avoid walking on wet floors.  Keep items that you use a lot in easy-to-reach places.  If you need to reach something above you, use a strong step stool that has a grab bar.  Keep electrical cords out of the way.  Do not use floor polish or wax that makes floors slippery. If you must use wax, use non-skid floor wax.  Do not have throw rugs and other things on the floor that can make you trip. What can I do with my stairs?  Do not leave any  items on the stairs.  Make sure that there are handrails on both sides of the stairs and use them. Fix handrails that are broken or loose. Make sure that handrails are as long as the stairways.  Check any carpeting to make sure that it is firmly attached to the stairs. Fix any carpet that is loose or worn.  Avoid having throw rugs at the top or bottom of the stairs. If you do have throw rugs, attach them to the floor with carpet tape.  Make sure that you have a light switch at the top of the stairs and the bottom of the stairs. If you do not have them, ask someone to add them for you. What else can I do to help prevent falls?  Wear shoes that:  Do not have high heels.  Have rubber bottoms.  Are comfortable and fit you well.  Are closed at the toe. Do not wear sandals.  If you use a stepladder:  Make sure that it is fully opened. Do not climb a closed stepladder.  Make sure that both sides of the stepladder are locked into place.  Ask someone to hold it for you, if  possible.  Clearly mark and make sure that you can see:  Any grab bars or handrails.  First and last steps.  Where the edge of each step is.  Use tools that help you move around (mobility aids) if they are needed. These include:  Canes.  Walkers.  Scooters.  Crutches.  Turn on the lights when you go into a dark area. Replace any light bulbs as soon as they burn out.  Set up your furniture so you have a clear path. Avoid moving your furniture around.  If any of your floors are uneven, fix them.  If there are any pets around you, be aware of where they are.  Review your medicines with your doctor. Some medicines can make you feel dizzy. This can increase your chance of falling. Ask your doctor what other things that you can do to help prevent falls. This information is not intended to replace advice given to you by your health care provider. Make sure you discuss any questions you have with your health care provider. Document Released: 03/18/2009 Document Revised: 10/28/2015 Document Reviewed: 06/26/2014 Elsevier Interactive Patient Education  2017 [employer].

## 2016-11-23 NOTE — Progress Notes (Signed)
Patient ID: Maria Christian, female   DOB: 1924/10/01, 83 y.o.   MRN: [id number]    DATE:  11/23/2016  Location:    [facility] Room Number: 403 A Place of Service: SNF (31)   Extended Emergency Contact Information Primary Emergency Contact: [name] [place] Phone: [phone] Relation: Son Secondary Emergency Contact: Roberts,Lynn Address: [address]          [place], [place] [address] [place] Phone: [phone] Mobile Phone: [phone] Relation: Daughter  Advanced Directive information Does Patient Have a Medical Advance Directive?: Yes, Type of Advance Directive: Out of facility DNR (pink MOST or yellow form), Pre-existing out of facility DNR order (yellow form or pink MOST form): Yellow form placed in chart (order not valid for inpatient use), Does patient want to make changes to medical advance directive?: No - Patient declined  Chief Complaint  Patient presents with  . Acute Visit    Right leg problem    HPI:  83 yo female long term resident seen today for RLE redness and ingrown toenail. Pt reports 2 week hx of RLE swelling, painful 3rd right toe x several weeks. No f/c. No d/c. Appetite reduced but this is chronic. No known trauma. She is a poor historian due to memory loss from dementia. Hx obtained form chart and nursing. Venous doppler in 09/2016 neg for DVT. She had an AWV exam today. Note reviewed.  Chronic pain due to osteoarthritis - stable on lidoderm to lower back daily; baclofen 5 mg nightly for leg spasticity; vicodin 5/325 mg1/2 tab every 6 hours as needed for pain    Past Medical History:  Diagnosis Date  . Altered mental status   . Anemia   . Atrial fibrillation ([place])   . CKD (chronic kidney disease) stage 3, GFR 30-59 ml/min 02/27/2015  . Constipation 02/27/2015  . Coronary artery disease   . DEMENTIA   . Depression   . Diabetes mellitus   . Edema of lower extremity 07/13/11   right leg more  swollen than left leg  . Encephalopathy   . Enlarged heart   . Esophageal dysmotility 07/02/12  . GERD (gastroesophageal reflux disease)   . History of adenomatous polyp of colon 06/24/99  . Horseshoe kidney   . Hyperlipemia   . Hypertension   . Pancreatic lesion 05/22/11   no further workup  per PCP/family due to age  . Parkinson disease ([place])   . Peripheral neuropathy   . Sigmoid volvulus ([place]) 05/24/2013  . Thrombophlebitis   . TIA (transient ischemic attack) 12/19/2012    Past Surgical History:  Procedure Laterality Date  . ABDOMINAL HYSTERECTOMY    . BOWEL DECOMPRESSION N/A 04/18/2016   Procedure: BOWEL DECOMPRESSION;  Surgeon: [name], MD;  Location: [facility];  Service: Endoscopy;  Laterality: N/A;  . BREAST SURGERY     2 benign tumors removed left breast  . CHOLECYSTECTOMY    . ESOPHAGEAL DILATION     several times by Dr. [name]  . ESOPHAGOGASTRODUODENOSCOPY (EGD) WITH ESOPHAGEAL DILATION N/A 08/01/2012   Procedure: ESOPHAGOGASTRODUODENOSCOPY (EGD) WITH ESOPHAGEAL DILATION;  Surgeon: [name], MD;  Location: [facility];  Service: Endoscopy;  Laterality: N/A;  with c-arm savory dilators  . ESOPHAGOGASTRODUODENOSCOPY (EGD) WITH PROPOFOL N/A 03/12/2015   Procedure: ESOPHAGOGASTRODUODENOSCOPY (EGD) WITH PROPOFOL;  Surgeon: [name], MD;  Location: [facility];  Service: Endoscopy;  Laterality: N/A;  . FLEXIBLE SIGMOIDOSCOPY N/A 05/24/2013   Procedure: FLEXIBLE  SIGMOIDOSCOPY;  Surgeon: [name], MD;  Location: [facility];  Service: Endoscopy;  Laterality: N/A;  . FLEXIBLE SIGMOIDOSCOPY N/A 05/26/2013   Procedure:  flex with decompression of sigmoid volvulus;  Surgeon: [name], MD;  Location: [facility];  Service: Endoscopy;  Laterality: N/A;  . FLEXIBLE SIGMOIDOSCOPY N/A 12/20/2015   Procedure: FLEXIBLE SIGMOIDOSCOPY;  Surgeon: Milus Banister, MD;  Location: [facility];  Service: Endoscopy;  Laterality: N/A;  . FLEXIBLE SIGMOIDOSCOPY  N/A 04/17/2016   Procedure: FLEXIBLE SIGMOIDOSCOPY;  Surgeon: Ladene Artist, MD;  Location: [facility];  Service: Endoscopy;  Laterality: N/A;  . FLEXIBLE SIGMOIDOSCOPY N/A 04/18/2016   Procedure: FLEXIBLE SIGMOIDOSCOPY;  Surgeon: [name], MD;  Location: [facility];  Service: Endoscopy;  Laterality: N/A;  . FOOT SURGERY     benign tumors from foot  . NOSE SURGERY    . SHOULDER SURGERY  2001   left clavicle excision and acromioplasty  . TOTAL HIP ARTHROPLASTY      Patient Care Team: [name], DO as PCP - General (Internal Medicine) Nyoka Cowden Phylis Bougie, NP as Nurse Practitioner (Nurse Practitioner)  Social History   Social History  . Marital status: Widowed    Spouse name: N/A  . Number of children: 5  . Years of education: 10th   Occupational History  . Retired    Social History Main Topics  . Smoking status: Never Smoker  . Smokeless tobacco: Never Used  . Alcohol use No  . Drug use: No  . Sexual activity: No   Other Topics Concern  . Not on file   Social History Narrative   Pt lives at [place] [facility]    Caffeine Use: very small amount daily     reports that she has never smoked. She has never used smokeless tobacco. She reports that she does not drink alcohol or use drugs.  Family History  Problem Relation Age of Onset  . Cancer Unknown   . Heart disease Unknown    Family Status  Relation Status  . Mother Deceased at age 30       blood clots  . Father Deceased at age 55       cirrohis of the liver  . Unknown (Not Specified)    Immunization History  Administered Date(s) Administered  . Influenza-Unspecified 07/13/2015, 03/09/2016  . PPD Test 07/28/2013, 02/18/2016, 02/25/2016    Allergies  Allergen Reactions  . Aspirin Other (See Comments)    G.I. Upset only    Medications: Patient's Medications  New Prescriptions   No medications on file  Previous Medications   AMLODIPINE (NORVASC) 2.5 MG TABLET    Take 2.5 mg by mouth every  morning. Hold for BP < 100/60 or HR < 60   ASPIRIN EC 81 MG TABLET    Take 81 mg by mouth daily.   BACLOFEN (LIORESAL) 10 MG TABLET    Take 5 mg by mouth at bedtime.   BENZOCAINE-MENTHOL (CHLORASEPTIC) 6-10 MG LOZENGE    Take 1 lozenge by mouth as needed for sore throat.   BIOTIN PO    Give 1 tablet by mouth one time a day for nutrition   CARVEDILOL (COREG) 3.125 MG TABLET    Take 3.125 mg by mouth daily.   CHOLECALCIFEROL (VITAMIN D) 1000 UNITS TABLET    Take 1,000 Units by mouth every morning.    CRANBERRY 475 MG CAPS    Take 1 capsule by mouth 2 (two) times daily.    DOCUSATE SODIUM (COLACE)  100 MG CAPSULE    Take 200 mg by mouth at bedtime.    ESCITALOPRAM (LEXAPRO) 10 MG TABLET    Take 10 mg by mouth daily.   FAMOTIDINE (PEPCID) 20 MG TABLET    Take 20 mg by mouth at bedtime.   FEEDING SUPPLIES MISC    by Does not apply route. House 2.0 Med Pass - 120 cc by mouth  two times daily   FERROUS SULFATE 220 (44 FE) MG/5ML SOLUTION    Give 5 ml by mouth in the evening for supplement   FLUTICASONE (FLONASE) 50 MCG/ACT NASAL SPRAY    Place 2 sprays into both nostrils at bedtime.   HYDROCODONE-ACETAMINOPHEN (NORCO/VICODIN) 5-325 MG TABLET    Take one-half (1/2) tablet by mouth every 6 hours as needed for pain   IPRATROPIUM-ALBUTEROL (DUONEB) 0.5-2.5 (3) MG/3ML SOLN    Take 3 mLs by nebulization daily as needed.   LIDOCAINE (LIDODERM) 5 %    Place 1 patch onto the skin daily. Apply 1 patch to left lower back/hip every morning.  Remove & Discard patch within 12 hours or as directed by MD   LINACLOTIDE (LINZESS) 145 MCG CAPS CAPSULE    Take 145 mcg by mouth daily before breakfast.    LORATADINE (CLARITIN) 10 MG TABLET    Take 10 mg by mouth daily before breakfast.    MULTIPLE VITAMINS-MINERALS (CERTAGEN PO)    Take 1 tablet by mouth daily.    OMEPRAZOLE (PRILOSEC) 20 MG CAPSULE    Take 20 mg by mouth daily.   ONDANSETRON (ZOFRAN) 8 MG TABLET    Take 8 mg by mouth every 8 (eight) hours as needed for  nausea or vomiting.   POLYETHYLENE GLYCOL (MIRALAX / GLYCOLAX) PACKET    Take 17 g by mouth daily at 6 (six) AM.    POTASSIUM CHLORIDE ER 20 MEQ TBCR    Take 40 mEq by mouth daily.   SENNA (SENOKOT) 8.6 MG TABS TABLET    Take 1 tablet (8.6 mg total) by mouth 2 (two) times daily.   SKIN PROTECTANTS, MISC. (CALAZIME SKIN PROTECTANT EX)    Apply 1 application topically daily. Buttocks protection   SODIUM CHLORIDE (OCEAN) 0.65 % SOLN NASAL SPRAY    Place 1 spray into both nostrils as directed. Four times daily And every 24 hours as needed to moisturize nasal passages.   TORSEMIDE (DEMADEX) 20 MG TABLET    Take 20 mg by mouth daily.   TRAVOPROST, BAK FREE, (TRAVATAN) 0.004 % SOLN OPHTHALMIC SOLUTION    Place 1 drop into both eyes daily at 8 pm.    VITAMIN B-12 (CYANOCOBALAMIN) 500 MCG TABLET    Take 500 mcg by mouth every evening.   Modified Medications   No medications on file  Discontinued Medications   No medications on file    Review of Systems  Unable to perform ROS: Dementia (memory loss)    Vitals:   11/23/16 1430  BP: 118/60  Pulse: 82  Temp: 97.1 F (36.2 C)  TempSrc: Oral  Weight: 165 lb (74.8 kg)  Height: 5\' 7"  (1.702 m)   Body mass index is 25.84 kg/m.  Physical Exam  Constitutional: She appears well-developed. No distress.  Frail appearing in NAD, sitting in w/c  Cardiovascular:  Pulses:      Dorsalis pedis pulses are 2+ on the right side, and 2+ on the left side.       Posterior tibial pulses are 2+ on the right side, and 2+  on the left side.  +1 pitting LE edema b/l. No calf TTP. Non-blanching redness in RLE  Musculoskeletal: She exhibits edema and deformity.  Neurological: She is alert.  Skin: Skin is warm and dry. There is erythema.  3rd toenail deformity with hypertrophy of toenails, TTP but no d/c. No ulceration noted.  Psychiatric: She has a normal mood and affect. Her behavior is normal.     Labs reviewed: No visits with results within 3 Month(s) from  this visit.  Latest known visit with results is:  Nursing Home on 08/16/2016  Component Date Value Ref Range Status  . Hemoglobin 08/12/2016 10.7* 12.0 - 16.0 g/dL Final  . HCT 08/12/2016 32* 36 - 46 % Final  . Platelets 08/12/2016 95* 150 - 399 K/L Final  . WBC 08/12/2016 4.0  10^3/mL Final  . Sed Rate 08/12/2016 10  mm Final  . Glucose 08/12/2016 111  mg/dL Final  . BUN 08/12/2016 33* 4 - 21 mg/dL Final  . Creatinine 08/12/2016 1.6* 0.5 - 1.1 mg/dL Final  . Potassium 08/12/2016 4.4  3.4 - 5.3 mmol/L Final  . Sodium 08/12/2016 143  137 - 147 mmol/L Final  . Alkaline Phosphatase 08/12/2016 90  25 - 125 U/L Final  . ALT 08/12/2016 7  7 - 35 U/L Final  . AST 08/12/2016 12* 13 - 35 U/L Final  . Bilirubin, Total 08/12/2016 0.3  mg/dL Final    No results found.   Assessment/Plan   ICD-10-CM   1. Toenail deformity L60.8   2. Right leg swelling M79.89   3. Primary osteoarthritis involving multiple joints M15.0   4. Dementia associated with other underlying disease without behavioral disturbance F02.80     Refer to podiatry for eval  Check CBC, BMP and lipid panel  Cont current meds as ordered  PT/OT as indicated  Wound care as indicated  Will follow  Darletta Noblett S. Perlie Gold  [facility] [address] [place], [place] [address] [phone] Cell (Monday-Friday 8 AM - 5 PM) [phone] After 5 PM and follow prompts

## 2016-11-24 LAB — CBC AND DIFFERENTIAL
HEMATOCRIT: 30 — AB (ref 36–46)
Hemoglobin: 9.9 — AB (ref 12.0–16.0)
Platelets: 98 — AB (ref 150–399)
WBC: 3.4

## 2016-11-24 LAB — BASIC METABOLIC PANEL
BUN: 17 (ref 4–21)
CREATININE: 1.2 — AB (ref 0.5–1.1)
GLUCOSE: 122
Potassium: 3.8 (ref 3.4–5.3)
SODIUM: 144 (ref 137–147)

## 2016-11-24 LAB — LIPID PANEL
Cholesterol: 133 (ref 0–200)
HDL: 41 (ref 35–70)
LDL CALC: 78
TRIGLYCERIDES: 75 (ref 40–160)

## 2016-12-02 IMAGING — CT CT ABD-PELV W/O CM
2 of 4 series · 16 of 46 positions shown, 18 images · non-contrast
Comparison: CT scan of December 27, 2013.

CLINICAL DATA: Acute generalized abdominal pain for 2 weeks.

EXAM:
CT ABDOMEN AND PELVIS WITHOUT CONTRAST
TECHNIQUE: Multidetector CT imaging of the abdomen and pelvis was performed
following the standard protocol without IV contrast.

[Series 2: abd/pel w/o · axial · non-contrast · 0.86mm/px · z∈[-550,-110]mm · 13 of 96 slices shown, 15 images]
[im 4/96  soft-tissue]
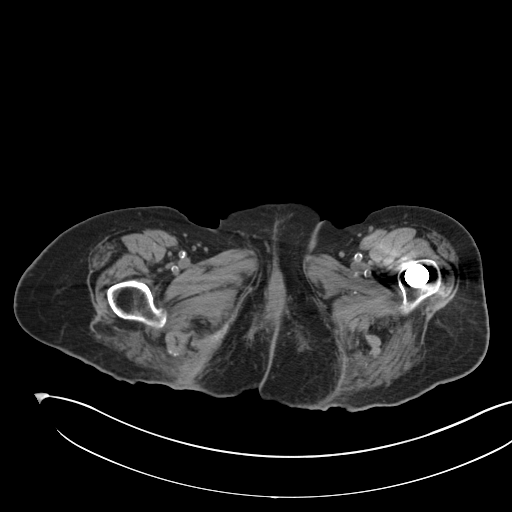
[im 4/96  bone]
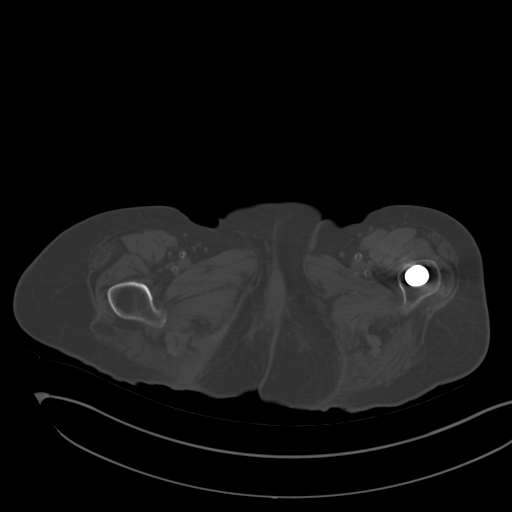
[im 12/96  soft-tissue]
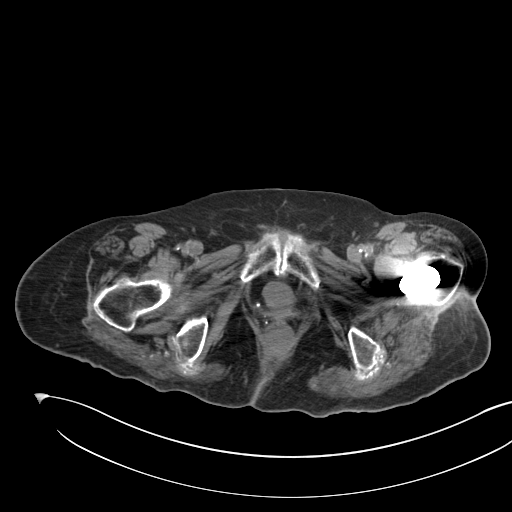
[im 20/96  soft-tissue]
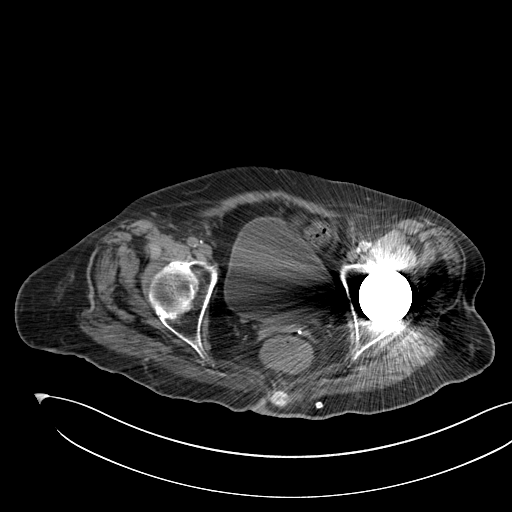
[im 28/96  soft-tissue]
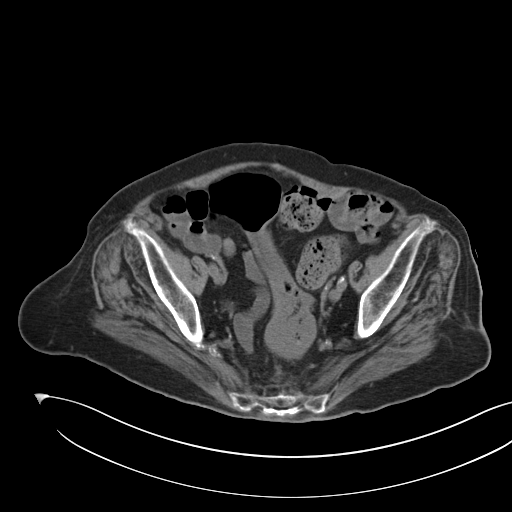
[im 32/96  soft-tissue]
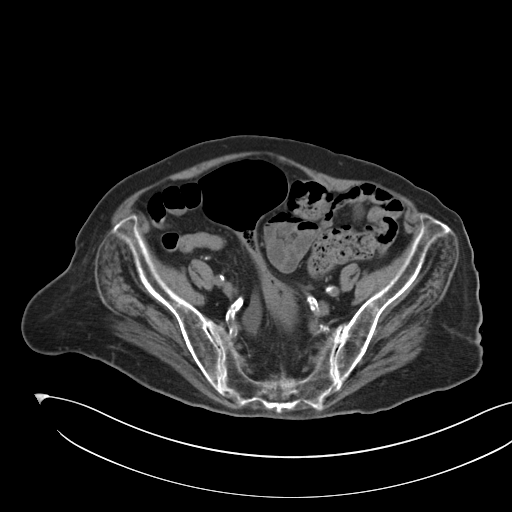
[im 40/96  soft-tissue]
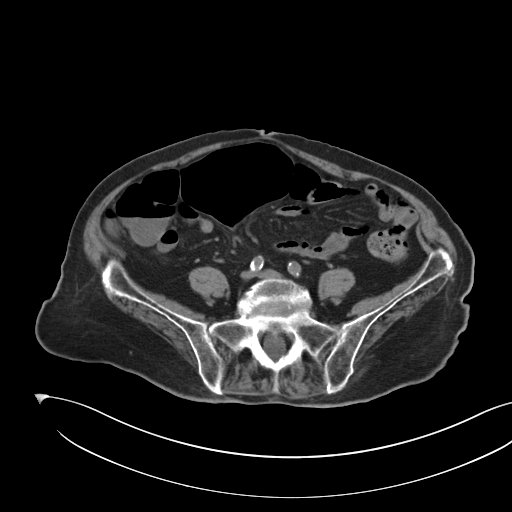
[im 48/96  soft-tissue]
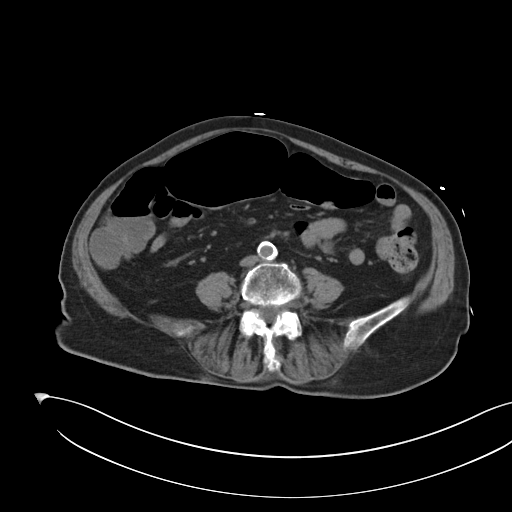
[im 56/96  soft-tissue]
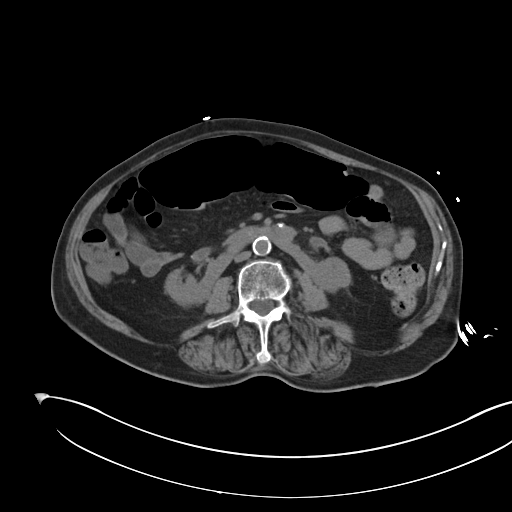
[im 64/96  soft-tissue]
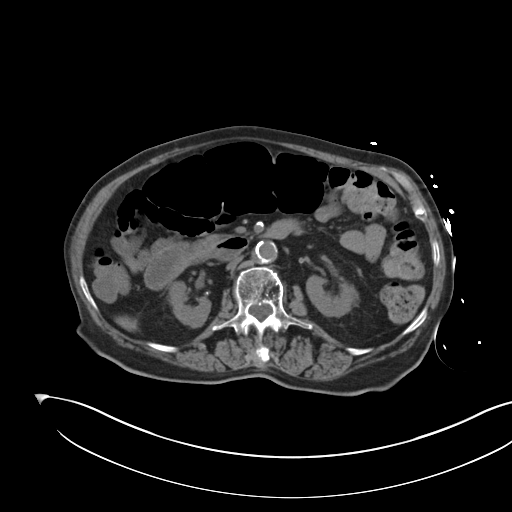
[im 64/96  bone]
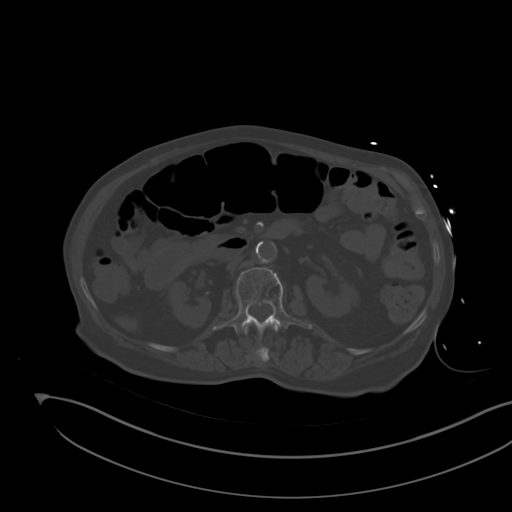
[im 68/96  soft-tissue]
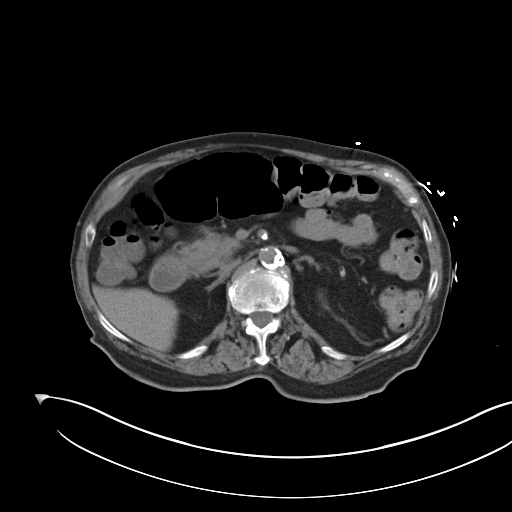
[im 76/96  soft-tissue]
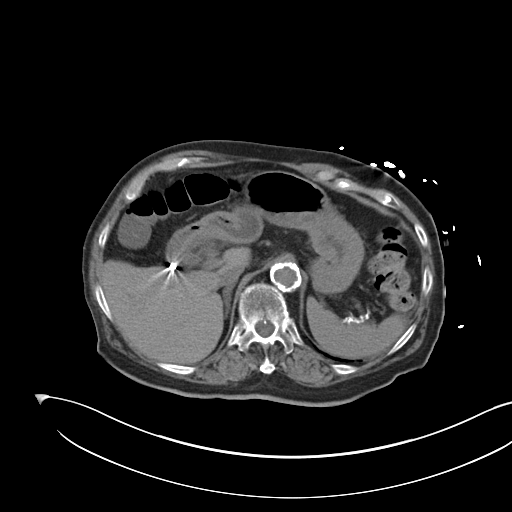
[im 84/96  soft-tissue]
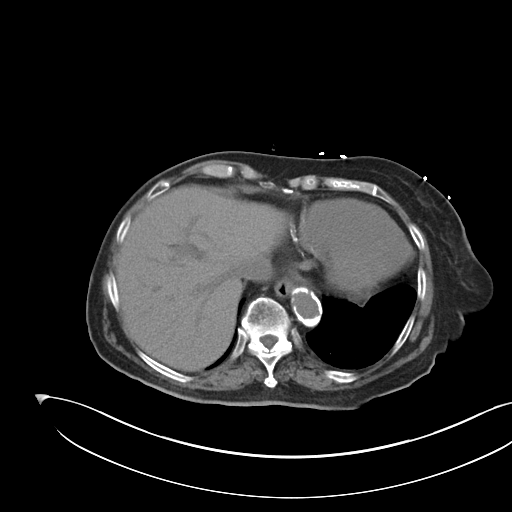
[im 92/96  soft-tissue]
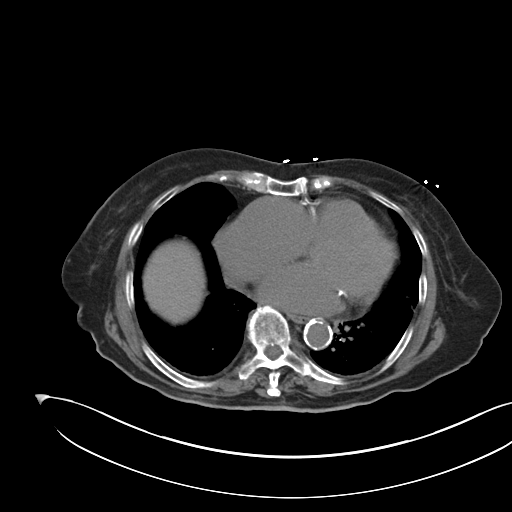

[Series 5: coronal · coronal · 0.74mm/px · 3 of 89 slices shown]
[im 30/89  soft-tissue]
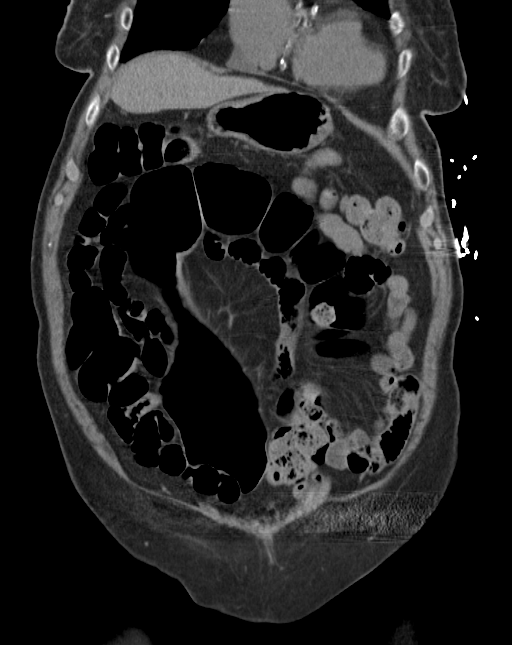
[im 40/89  soft-tissue]
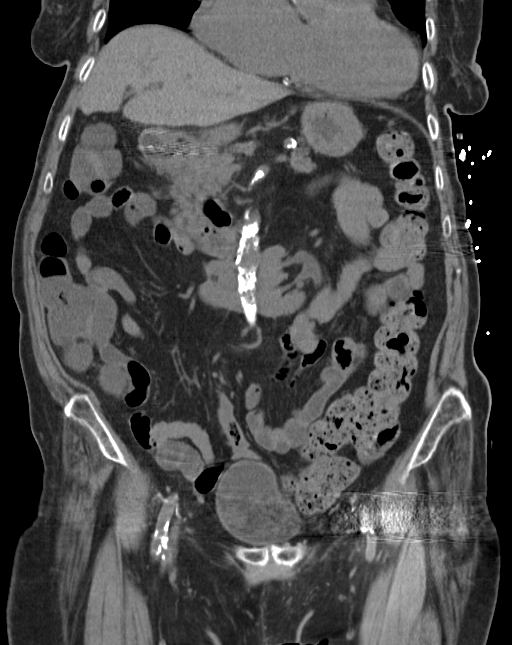
[im 49/89  soft-tissue]
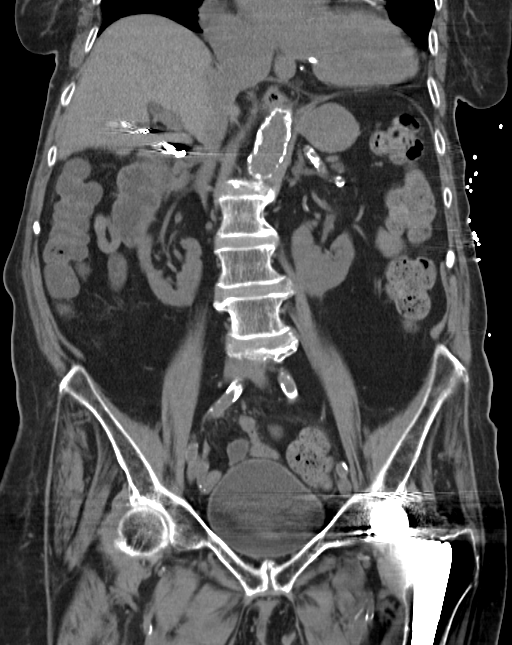

[16 of 46 positions shown; findings below may reference images not displayed]

FINDINGS: Visualized lung bases appear normal. Status post left hip
arthroplasty.

Status post cholecystectomy. No focal abnormality is noted in the
liver, spleen or pancreas is noted on these unenhanced images.
Atherosclerosis of abdominal aorta is noted without aneurysm
formation. Horseshoe configuration of the kidneys is noted. Adrenal
glands appear normal. No hydronephrosis or renal obstruction is
noted. No renal or ureteral calculi are noted. Mildly dilated
sigmoid colon is noted which appears to be due to ileus. No volvulus
is noted. No abnormal fluid collection is noted. Urinary bladder
appears normal. Status post hysterectomy. Ovaries appear normal. No
significant adenopathy is noted.
IMPRESSION: Atherosclerosis of abdominal aorta is noted without aneurysm
formation.

Horseshoe configuration of the kidneys is noted. No hydronephrosis
or renal obstruction is noted. No renal or ureteral calculi are
noted.

Dilated and air-filled sigmoid colon is noted most consistent with
ileus. There is no evidence of volvulus or obstruction.

## 2016-12-02 IMAGING — CR DG ABDOMEN 1V
1 series · 1 of 1 positions shown · non-contrast
Comparison: 12/27/2013.

CLINICAL DATA: Initial encounter for 2 week history of abdominal
pain and distention.

EXAM:
ABDOMEN - 1 VIEW

[t abdomen supine]
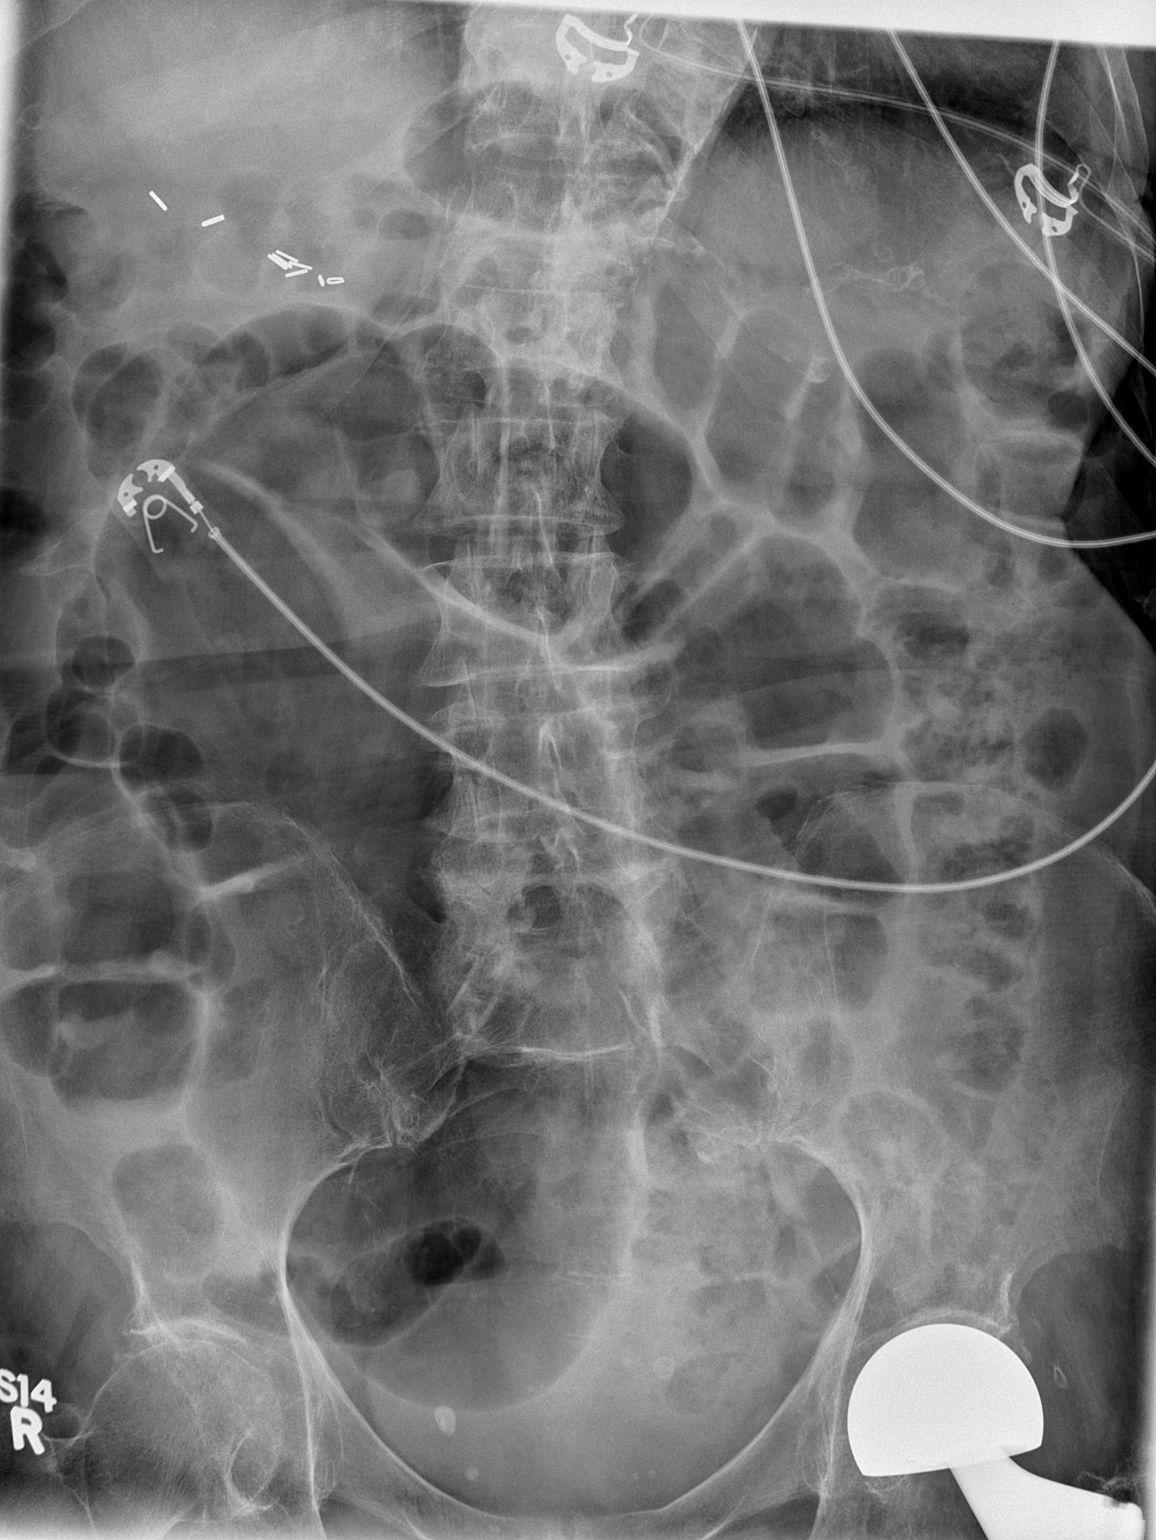

[1 of 1 positions shown; findings below may reference images not displayed]

FINDINGS: Supine view the abdomen shows diffuse gaseous distention of bowel,
mainly representing colon. Imaging features are similar to prior
study. Surgical clips in right upper quadrant compatible with prior
cholecystectomy. Bones are diffusely demineralized.
IMPRESSION: Diffuse gaseous bowel distention, mainly involving colon. Imaging
features may be related to chronic dysmotility, ileus, or evolving
distal colonic obstruction.

## 2016-12-07 ENCOUNTER — Ambulatory Visit (INDEPENDENT_AMBULATORY_CARE_PROVIDER_SITE_OTHER): Payer: Medicare Other | Admitting: Podiatry

## 2016-12-07 DIAGNOSIS — M79676 Pain in unspecified toe(s): Secondary | ICD-10-CM | POA: Diagnosis not present

## 2016-12-07 DIAGNOSIS — B351 Tinea unguium: Secondary | ICD-10-CM | POA: Diagnosis not present

## 2016-12-07 DIAGNOSIS — E1159 Type 2 diabetes mellitus with other circulatory complications: Secondary | ICD-10-CM

## 2016-12-07 DIAGNOSIS — L84 Corns and callosities: Secondary | ICD-10-CM | POA: Diagnosis not present

## 2016-12-08 NOTE — Progress Notes (Signed)
   Subjective:    Patient ID: Maria Christian, female    DOB: [birth year], 83 y.o.   MRN: [id number]  HPI this patient presents the office with chief complaint of long thick painful nails.  She says the nails are painful walking and wearing her shoes.  She also has severe pain noted in the toenail third toe, right foot.  She is also concerned about her severe swelling in her right leg and foot.  She admits to wearing compression socks during the course of the day.  She desires an evaluation and treatment of her long thick nails. This patient is diabetic, type II    Review of Systems  All other systems reviewed and are negative.      Objective:   Physical Exam GENERAL APPEARANCE: Alert, conversant. Appropriately groomed. No acute distress.  VASCULAR: Pedal pulses are not   palpable at  [facility] and PT bilateral.  Capillary refill time is immediate to all digits,  Normal temperature gradient.   NEUROLOGIC: sensation is diminished  to 5.07 monofilament at 5/5 sites bilateral.  Light touch is intact bilateral, Muscle strength normal.  MUSCULOSKELETAL: acceptable muscle strength, tone and stability bilateral.  Intrinsic muscluature intact bilateral.  Rectus appearance of foot and digits noted bilateral.  NAILS  Thick disfigured discolored nails with no bacterial infection. DERMATOLOGIC: skin color, texture, and turgor are within normal limits.  No preulcerative lesions or ulcers  are seen, no interdigital maceration noted.  No open lesions present.  Distal clavi 3rd toe right.. No drainage noted.         Assessment & Plan:  Onychomycosis  B/L  Clavi 3rd toe right  IE  Debride Nails   Debride clavi.  RTC 3 months.   Gardiner Barefoot DPM

## 2016-12-11 ENCOUNTER — Non-Acute Institutional Stay (SKILLED_NURSING_FACILITY): Payer: Medicare Other | Admitting: Adult Health

## 2016-12-11 ENCOUNTER — Encounter: Payer: Self-pay | Admitting: Adult Health

## 2016-12-11 DIAGNOSIS — B029 Zoster without complications: Secondary | ICD-10-CM | POA: Insufficient documentation

## 2016-12-11 NOTE — Progress Notes (Signed)
Location:   [facility] Room Number: 810 A Place of Service:  SNF (31)   CODE STATUS: DNR  Allergies  Allergen Reactions  . Aspirin Other (See Comments)    [name]    Chief Complaint  Patient presents with  . Acute Visit    Shingles    HPI:  I have been asked to see her for a rash on her left lower arm. She tells me that the rash itches and stings. She is also complaining of left arm pain and is having difficulty using her arm due to her pain. There are no reports of fever present    Past Medical History:  Diagnosis Date  . Altered mental status   . Anemia   . Atrial fibrillation ([place])   . CKD (chronic kidney disease) stage 3, GFR 30-59 ml/min 02/27/2015  . Constipation 02/27/2015  . Coronary artery disease   . DEMENTIA   . Depression   . Diabetes mellitus   . Edema of lower extremity 07/13/11   right leg more swollen than left leg  . Encephalopathy   . Enlarged heart   . Esophageal dysmotility 07/02/12  . GERD (gastroesophageal reflux disease)   . History of adenomatous polyp of colon 06/24/99  . Horseshoe kidney   . Hyperlipemia   . Hypertension   . Pancreatic lesion 05/22/11   no further workup  per PCP/family due to age  . Parkinson disease ([place])   . Peripheral neuropathy   . Sigmoid volvulus ([place]) 05/24/2013  . Thrombophlebitis   . TIA (transient ischemic attack) 12/19/2012    Past Surgical History:  Procedure Laterality Date  . ABDOMINAL HYSTERECTOMY    . BOWEL DECOMPRESSION N/A 04/18/2016   Procedure: BOWEL DECOMPRESSION;  Surgeon: [name], MD;  Location: [facility];  Service: Endoscopy;  Laterality: N/A;  . BREAST SURGERY     2 benign tumors removed left breast  . CHOLECYSTECTOMY    . ESOPHAGEAL DILATION     several times by Dr. [name]  . ESOPHAGOGASTRODUODENOSCOPY (EGD) WITH ESOPHAGEAL DILATION N/A 08/01/2012   Procedure: ESOPHAGOGASTRODUODENOSCOPY (EGD) WITH ESOPHAGEAL DILATION;  Surgeon: [name], MD;  Location:  [facility];  Service: Endoscopy;  Laterality: N/A;  with c-arm savory dilators  . ESOPHAGOGASTRODUODENOSCOPY (EGD) WITH PROPOFOL N/A 03/12/2015   Procedure: ESOPHAGOGASTRODUODENOSCOPY (EGD) WITH PROPOFOL;  Surgeon: [name], MD;  Location: [facility];  Service: Endoscopy;  Laterality: N/A;  . FLEXIBLE SIGMOIDOSCOPY N/A 05/24/2013   Procedure: FLEXIBLE SIGMOIDOSCOPY;  Surgeon: [name], MD;  Location: [facility];  Service: Endoscopy;  Laterality: N/A;  . FLEXIBLE SIGMOIDOSCOPY N/A 05/26/2013   Procedure:  flex with decompression of sigmoid volvulus;  Surgeon: [name], MD;  Location: [facility];  Service: Endoscopy;  Laterality: N/A;  . FLEXIBLE SIGMOIDOSCOPY N/A 12/20/2015   Procedure: FLEXIBLE SIGMOIDOSCOPY;  Surgeon: Milus Banister, MD;  Location: [facility];  Service: Endoscopy;  Laterality: N/A;  . FLEXIBLE SIGMOIDOSCOPY N/A 04/17/2016   Procedure: FLEXIBLE SIGMOIDOSCOPY;  Surgeon: Ladene Artist, MD;  Location: [facility];  Service: Endoscopy;  Laterality: N/A;  . FLEXIBLE SIGMOIDOSCOPY N/A 04/18/2016   Procedure: FLEXIBLE SIGMOIDOSCOPY;  Surgeon: [name], MD;  Location: [facility];  Service: Endoscopy;  Laterality: N/A;  . FOOT SURGERY     benign tumors from foot  . NOSE SURGERY    . SHOULDER SURGERY  2001   left clavicle excision and acromioplasty  . TOTAL HIP ARTHROPLASTY  Social History   Social History  . Marital status: Widowed    Spouse name: N/A  . Number of children: 5  . Years of education: 10th   Occupational History  . Retired    Social History Main Topics  . Smoking status: Never Smoker  . Smokeless tobacco: Never Used  . Alcohol use No  . Drug use: No  . Sexual activity: No   Other Topics Concern  . Not on file   Social History Narrative   Pt lives at [place]    Caffeine Use: very small amount daily   Family History  Problem Relation Age of Onset  . Cancer Unknown   . Heart disease Unknown       VITAL  SIGNS BP 130/70   Pulse 70   Temp (!) 97.4 F (36.3 C)   Resp 17   Ht 5\' 7"  (1.702 m)   Wt 155 lb 3.2 oz (70.4 kg)   SpO2 97%   BMI 24.31 kg/m   Patient's Medications  New Prescriptions   No medications on file  Previous Medications   AMLODIPINE (NORVASC) 2.5 MG TABLET    Take 2.5 mg by mouth every morning. Hold for BP < 100/60 or HR < 60   ASPIRIN EC 81 MG TABLET    Take 81 mg by mouth daily.   BACLOFEN (LIORESAL) 10 MG TABLET    Take 5 mg by mouth at bedtime.   BENZOCAINE-MENTHOL (CHLORASEPTIC) 6-10 MG LOZENGE    Take 1 lozenge by mouth as needed for sore throat.   BIOTIN PO    Give 1 tablet by mouth one time a day for nutrition   CARVEDILOL (COREG) 3.125 MG TABLET    Take 3.125 mg by mouth daily.   CHOLECALCIFEROL (VITAMIN D) 1000 UNITS TABLET    Take 1,000 Units by mouth every evening.    CRANBERRY 475 MG CAPS    Take 1 capsule by mouth 2 (two) times daily.    DOCUSATE SODIUM (COLACE) 100 MG CAPSULE    Take 200 mg by mouth at bedtime.    DORZOLAMIDE (TRUSOPT) 2 % OPHTHALMIC SOLUTION    Place 1 drop into both eyes 3 (three) times daily.   ESCITALOPRAM (LEXAPRO) 10 MG TABLET    Take 10 mg by mouth daily.   FAMOTIDINE (PEPCID) 20 MG TABLET    Take 20 mg by mouth at bedtime.   FEEDING SUPPLIES MISC    by Does not apply route. House 2.0 Med Pass - 120 cc by mouth  two times daily   FERROUS SULFATE 220 (44 FE) MG/5ML SOLUTION    Give 5 ml by mouth in the evening for supplement   FLUTICASONE (FLONASE) 50 MCG/ACT NASAL SPRAY    Place 2 sprays into both nostrils at bedtime.   HYDROCODONE-ACETAMINOPHEN (NORCO/VICODIN) 5-325 MG TABLET    Take one-half (1/2) tablet by mouth every 6 hours as needed for pain   IPRATROPIUM-ALBUTEROL (DUONEB) 0.5-2.5 (3) MG/3ML SOLN    Take 3 mLs by nebulization daily as needed.   LIDOCAINE (LIDODERM) 5 %    Place 1 patch onto the skin daily. Apply 1 patch to left lower back/hip every morning.  Remove & Discard patch within 12 hours or as directed by MD    LINACLOTIDE (LINZESS) 145 MCG CAPS CAPSULE    Take 145 mcg by mouth daily before breakfast.    LORATADINE (CLARITIN) 10 MG TABLET    Take 10 mg by mouth daily before breakfast.  MULTIPLE VITAMINS-MINERALS (CERTAGEN PO)    Take 1 tablet by mouth daily.    OMEPRAZOLE (PRILOSEC) 20 MG CAPSULE    Take 20 mg by mouth daily.   ONDANSETRON (ZOFRAN) 8 MG TABLET    Take 8 mg by mouth every 8 (eight) hours as needed for nausea or vomiting.   POLYETHYLENE GLYCOL (MIRALAX / GLYCOLAX) PACKET    Take 17 g by mouth daily at 6 (six) AM.    POTASSIUM CHLORIDE ER 20 MEQ TBCR    Take 40 mEq by mouth daily.   SENNA (SENOKOT) 8.6 MG TABS TABLET    Take 1 tablet (8.6 mg total) by mouth 2 (two) times daily.   SKIN PROTECTANTS, MISC. (CALAZIME SKIN PROTECTANT EX)    Apply 1 application topically daily. Buttocks protection   SODIUM CHLORIDE (OCEAN) 0.65 % SOLN NASAL SPRAY    Place 1 spray into both nostrils as directed. Four times daily And every 24 hours as needed to moisturize nasal passages.   TORSEMIDE (DEMADEX) 20 MG TABLET    Take 20 mg by mouth daily.   TRAVOPROST, BAK FREE, (TRAVATAN) 0.004 % SOLN OPHTHALMIC SOLUTION    Place 1 drop into both eyes daily at 8 pm.    VITAMIN B-12 (CYANOCOBALAMIN) 500 MCG TABLET    Take 500 mcg by mouth every evening.   Modified Medications   No medications on file  Discontinued Medications   No medications on file     SIGNIFICANT DIAGNOSTIC EXAMS  02-15-15: abdominal ultrasound: 1. Gallbladder not visualized. 2. No hydronephrosis no renal calculus; 3. The pancreas not well visualized; which could be related to gassy abdomen but an appearance which might represent pancreatitis in the appropriate clinical setting.   03-12-15: upper endoscopy: duodenal bulb 1.5-2.0 cm ulcer with clot at base. No active bleeding. Surrounding mucosa was edematous. Biopsy of the surrounding mucosa were done.   12-20-15: ct of abdomen and pelvis: 1. Loosely twisted sigmoid volvulus with transition  point. Proximal distention is improved compared to radiography earlier today suggesting partial interval decompression. 2. Chronic findings are stable and described above.  12-20-15: flex sigmoidoscopy: chronic intermittent sigmoid volvulus. The site of volvulus was only slightly twisted during this exam, easily passed with colonoscope and the air filled dilated colon proximal to it was suctioned extensively. Her abdomen was flat, normal after the exam.   02-19-16: right lower extremity doppler: neg dvt  02-23-16: renal ultrasound: normal right kidney slightly small left kidney. Right 1.9 cm cyst no hydronephrosis or stones   03-19-16: chest x-ray: modest cardiomegaly with mild pulmonary venous congestion   09-05-16: right lower extremity doppler: DVT: neg    LABS REVIEWED:     12-20-15: wbc 5.2; hgb 11.0; hct 32.4; mcv 101.3; plt 107; glucose 138; bun 31; creat 1.61; k+ 3.2; na++ 138; liver normal albumin 3.9; urine culture: e-coli: rocephin 12-21-15: wbc 3.9; hgb 9.8; hct 29.7; mcv 103.1; plt 98; glucose 108; bun 23; creat 1.15; k+ 3.2; na++ 143; mag 2.4 12-22-15: wbc 5.1; hgb 10.8; hct 32.3; mcv 103.2; plt 101; glucose 125; bun 18; creat 1.09; k+ 3.9; na++ 142  01-04-16: wbc 4.1; hgb 10.9; hct 32.4; mcv 100.5; plt 175; glucose 121; bun 13.2; creat 1.18; k+ 3.8; na++ 144; liver normal albumin 3.8 01-31-16; urine culture: e-coli: ESBL invanz 02-15-16: wbc 4.6; hgb 11.3; hct 36.3; mcv 102.7; plt 128; glucose 160; bun 15.3; creat 1.65; k+ 4.5; na++ 143; liver normal albumin 3.9 02-19-16: glucose 116; bun 28.7; mcv 1.72; k+ 4.5;  na++ 143 02-23-16; wbc 3.5 hgb 9.7; hct 30.4; mcv 101.8; plt 126; glucose 118; bun 25.6; creat 1.52; k+ 4.6; na++ 144  03-11-16: glucose 196; bun 18.4; creat 1.28; k+ 3.7; na++ 141  03-19-16: A/B neg for influenza 04-01-16: glucose 139; bun 15.7; creat 1.16; k+ 3.1; na++ 144 04-07-16: glucose 140; bun 18.2; creat 1.37; k+ 4.1; na++ 143  04-17-16: wbc 5.0; hgb 11.1; hct 33.3; mcv  100.6; plt 119; glucose 178; bun 19; creat 1.22; k+ 3.7; na++ 141; liver normal albumin 3.8 04-18-16: wbc 4.0; hgb 9.3; hct 28.8; mcv 101.1; plt 93; glucose 124; bun 17; creat 1.20; k+ 2.9; na++ 141; liver normal albumin 3.2; mag 1.7 phos 3.7; tsh 2.572; hgb a1c 6.9 04-20-16: wbc 5.5; hgb 10.2; hct 30.9; mcv 100.0; plt 97; glucose 103; bun 14; create 1.00; k+ 3.7; na++ 143; mag 1.8  04-24-16:glucose 132; bun 17; creat 1.05; k+ 4.3; na++ 138  08-12-16: wb 4.0; hgb 10.7; hct 32.1 ;mcv 103.2; plt 95; glucose 111 bun 33.0; creat 1.57; k+ 4.4 ;na++ 143; liver normal albumin 3.5     Review of Systems  Constitutional: Negative for appetite change and fatigue.  HENT: Negative for congestion.   Respiratory: Negative for cough, chest tightness and shortness of breath.   Cardiovascular: Negative for chest pain, palpitations and leg swelling.  Gastrointestinal: Negative for nausea, abdominal pain, diarrhea and constipation.  Musculoskeletal: no complaint of joint pain   has left arm pain  Skin: has rash on left lower arm which itches and stings Neurological: Negative for dizziness.  Psychiatric/Behavioral: The patient is not nervous/anxious.       Physical Exam  Constitutional: No distress.  Eyes: Conjunctivae are normal.  Neck: Neck supple. No JVD present. No thyromegaly present.  Cardiovascular: Normal rate, regular rhythm and intact distal pulses.   Respiratory: Effort normal and breath sounds normal. No respiratory distress. She has no wheezes.   GI: Soft. Bowel sounds normal slightly  distended no tenderness  Musculoskeletal: She exhibits no edema.  Able to move all extremities .   Lymphadenopathy:    She has no cervical adenopathy.  Neurological: She is alert.  Skin: Skin is warm and dry. She is not diaphoretic. Left lower arm with a shingles rash which has crusted over.   Psychiatric: She has a normal mood and affect.     ASSESSMENT/ PLAN:  1. Shingles: since the rash has crusted  over; she will not need valtrex. Will begin neurontin 100 mg nightly to help with pain from rash     Ok Edwards NP [facility]  Contact [phone] Monday through Friday 8am- 5pm  After hours call [phone]

## 2016-12-13 ENCOUNTER — Non-Acute Institutional Stay (SKILLED_NURSING_FACILITY): Payer: Medicare Other | Admitting: Adult Health

## 2016-12-13 DIAGNOSIS — I110 Hypertensive heart disease with heart failure: Secondary | ICD-10-CM | POA: Diagnosis not present

## 2016-12-13 DIAGNOSIS — K269 Duodenal ulcer, unspecified as acute or chronic, without hemorrhage or perforation: Secondary | ICD-10-CM

## 2016-12-13 DIAGNOSIS — I5032 Chronic diastolic (congestive) heart failure: Secondary | ICD-10-CM | POA: Diagnosis not present

## 2016-12-13 DIAGNOSIS — J301 Allergic rhinitis due to pollen: Secondary | ICD-10-CM

## 2016-12-13 NOTE — Progress Notes (Signed)
Location:   [facility] Room Number: 604 Place of Service:  SNF ([id number])   CODE STATUS: dnr  Allergies  Allergen Reactions  . Aspirin Other (See Comments)    [name]    Chief Complaint  Patient presents with  . Medical Management of Chronic Issues    HPI:  She is a 83 year old long term resident of this facility being seen for the management of her chronic illnesses: hypertension; diastolic heart failure; duodenal ulcer; allergic rhinitis. Overall her status is stable. She continue to get out of bed on a daily basis. She has been treated for shingles over this past month without complications. She tells me that she is feeling good and has no complaints. There are no nursing concerns at this time.    Past Medical History:  Diagnosis Date  . Altered mental status   . Anemia   . Atrial fibrillation ([place])   . CKD (chronic kidney disease) stage 3, GFR 30-59 ml/min 02/27/2015  . Constipation 02/27/2015  . Coronary artery disease   . DEMENTIA   . Depression   . Diabetes mellitus   . Edema of lower extremity 07/13/11   right leg more swollen than left leg  . Encephalopathy   . Enlarged heart   . Esophageal dysmotility 07/02/12  . GERD (gastroesophageal reflux disease)   . History of adenomatous polyp of colon 06/24/99  . Horseshoe kidney   . Hyperlipemia   . Hypertension   . Pancreatic lesion 05/22/11   no further workup  per PCP/family due to age  . Parkinson disease ([place])   . Peripheral neuropathy   . Sigmoid volvulus ([place]) 05/24/2013  . Thrombophlebitis   . TIA (transient ischemic attack) 12/19/2012    Past Surgical History:  Procedure Laterality Date  . ABDOMINAL HYSTERECTOMY    . BOWEL DECOMPRESSION N/A 04/18/2016   Procedure: BOWEL DECOMPRESSION;  Surgeon: [name], MD;  Location: [facility];  Service: Endoscopy;  Laterality: N/A;  . BREAST SURGERY     2 benign tumors removed left breast  . CHOLECYSTECTOMY    . ESOPHAGEAL DILATION     several times by Dr. [name]  . ESOPHAGOGASTRODUODENOSCOPY (EGD) WITH ESOPHAGEAL DILATION N/A 08/01/2012   Procedure: ESOPHAGOGASTRODUODENOSCOPY (EGD) WITH ESOPHAGEAL DILATION;  Surgeon: [name], MD;  Location: [facility];  Service: Endoscopy;  Laterality: N/A;  with c-arm savory dilators  . ESOPHAGOGASTRODUODENOSCOPY (EGD) WITH PROPOFOL N/A 03/12/2015   Procedure: ESOPHAGOGASTRODUODENOSCOPY (EGD) WITH PROPOFOL;  Surgeon: [name], MD;  Location: [facility];  Service: Endoscopy;  Laterality: N/A;  . FLEXIBLE SIGMOIDOSCOPY N/A 05/24/2013   Procedure: FLEXIBLE SIGMOIDOSCOPY;  Surgeon: [name], MD;  Location: [facility];  Service: Endoscopy;  Laterality: N/A;  . FLEXIBLE SIGMOIDOSCOPY N/A 05/26/2013   Procedure:  flex with decompression of sigmoid volvulus;  Surgeon: [name], MD;  Location: [facility];  Service: Endoscopy;  Laterality: N/A;  . FLEXIBLE SIGMOIDOSCOPY N/A 12/20/2015   Procedure: FLEXIBLE SIGMOIDOSCOPY;  Surgeon: Milus Banister, MD;  Location: [facility];  Service: Endoscopy;  Laterality: N/A;  . FLEXIBLE SIGMOIDOSCOPY N/A 04/17/2016   Procedure: FLEXIBLE SIGMOIDOSCOPY;  Surgeon: Ladene Artist, MD;  Location: [facility];  Service: Endoscopy;  Laterality: N/A;  . FLEXIBLE SIGMOIDOSCOPY N/A 04/18/2016   Procedure: FLEXIBLE SIGMOIDOSCOPY;  Surgeon: [name], MD;  Location: [facility];  Service: Endoscopy;  Laterality: N/A;  . FOOT SURGERY     benign tumors from foot  . NOSE SURGERY    .  SHOULDER SURGERY  2001   left clavicle excision and acromioplasty  . TOTAL HIP ARTHROPLASTY      Social History   Social History  . Marital status: Widowed    Spouse name: N/A  . Number of children: 5  . Years of education: 10th   Occupational History  . Retired    Social History Main Topics  . Smoking status: Never Smoker  . Smokeless tobacco: Never Used  . Alcohol use No  . Drug use: No  . Sexual activity: No   Other Topics Concern  .  Not on file   Social History Narrative   Pt lives at [facility]    Caffeine Use: very small amount daily   Family History  Problem Relation Age of Onset  . Cancer Unknown   . Heart disease Unknown       VITAL SIGNS BP 137/75   Pulse 68   Temp (!) 97.5 F (36.4 C)   Resp 16   Ht 5\' 7"  (1.702 m)   Wt 155 lb (70.3 kg)   BMI 24.28 kg/m   Patient's Medications  New Prescriptions   No medications on file  Previous Medications   AMLODIPINE (NORVASC) 2.5 MG TABLET    Take 2.5 mg by mouth every morning. Hold for BP < 100/60 or HR < 60   ASPIRIN EC 81 MG TABLET    Take 81 mg by mouth daily.   BACLOFEN (LIORESAL) 10 MG TABLET    Take 5 mg by mouth at bedtime.   BENZOCAINE-MENTHOL (CHLORASEPTIC) 6-10 MG LOZENGE    Take 1 lozenge by mouth as needed for sore throat.   BIOTIN PO    Give 1 tablet by mouth one time a day for nutrition   CARVEDILOL (COREG) 3.125 MG TABLET    Take 3.125 mg by mouth daily.   CHOLECALCIFEROL (VITAMIN D) 1000 UNITS TABLET    Take 1,000 Units by mouth every evening.    CRANBERRY 475 MG CAPS    Take 1 capsule by mouth 2 (two) times daily.    DOCUSATE SODIUM (COLACE) 100 MG CAPSULE    Take 200 mg by mouth at bedtime.    DORZOLAMIDE (TRUSOPT) 2 % OPHTHALMIC SOLUTION    Place 1 drop into both eyes 3 (three) times daily.   ESCITALOPRAM (LEXAPRO) 10 MG TABLET    Take 10 mg by mouth daily.   FAMOTIDINE (PEPCID) 20 MG TABLET    Take 20 mg by mouth at bedtime.   FEEDING SUPPLIES MISC    by Does not apply route. House 2.0 Med Pass - 120 cc by mouth  two times daily   FERROUS SULFATE 220 (44 FE) MG/5ML SOLUTION    Give 5 ml by mouth in the evening for supplement   FLUTICASONE (FLONASE) 50 MCG/ACT NASAL SPRAY    Place 2 sprays into both nostrils at bedtime.   GABAPENTIN (NEURONTIN) 100 MG CAPSULE    Take 100 mg by mouth at bedtime.   HYDROCODONE-ACETAMINOPHEN (NORCO/VICODIN) 5-325 MG TABLET    Take one-half (1/2) tablet by mouth every 6 hours as needed for pain    IPRATROPIUM-ALBUTEROL (DUONEB) 0.5-2.5 (3) MG/3ML SOLN    Take 3 mLs by nebulization daily as needed.   LIDOCAINE (LIDODERM) 5 %    Place 1 patch onto the skin daily. Apply 1 patch to left lower back/hip every morning.  Remove & Discard patch within 12 hours or as directed by MD   LINACLOTIDE (LINZESS) [place]  Take 145 mcg by mouth daily before breakfast.    LORATADINE (CLARITIN) 10 MG TABLET    Take 10 mg by mouth daily before breakfast.    MULTIPLE VITAMINS-MINERALS (CERTAGEN PO)    Take 1 tablet by mouth daily.    OMEPRAZOLE (PRILOSEC) 20 MG CAPSULE    Take 20 mg by mouth daily.   ONDANSETRON (ZOFRAN) 8 MG TABLET    Take 8 mg by mouth every 8 (eight) hours as needed for nausea or vomiting.   POLYETHYLENE GLYCOL (MIRALAX / GLYCOLAX) PACKET    Take 17 g by mouth daily at 6 (six) AM.    POTASSIUM CHLORIDE ER 20 MEQ TBCR    Take 40 mEq by mouth daily.   SENNA (SENOKOT) 8.6 MG TABS TABLET    Take 1 tablet (8.6 mg total) by mouth 2 (two) times daily.   SKIN PROTECTANTS, MISC. (CALAZIME SKIN PROTECTANT EX)    Apply 1 application topically daily. Buttocks protection   SODIUM CHLORIDE (OCEAN) 0.65 % SOLN NASAL SPRAY    Place 1 spray into both nostrils as directed. Four times daily And every 24 hours as needed to moisturize nasal passages.   TORSEMIDE (DEMADEX) 20 MG TABLET    Take 20 mg by mouth daily.   TRAVOPROST, BAK FREE, (TRAVATAN) 0.004 % SOLN OPHTHALMIC SOLUTION    Place 1 drop into both eyes daily at 8 pm.    VITAMIN B-12 (CYANOCOBALAMIN) 500 MCG TABLET    Take 500 mcg by mouth every evening.   Modified Medications   No medications on file  Discontinued Medications   No medications on file     SIGNIFICANT DIAGNOSTIC EXAMS  02-15-15: abdominal ultrasound: 1. Gallbladder not visualized. 2. No hydronephrosis no renal calculus; 3. The pancreas not well visualized; which could be related to gassy abdomen but an appearance which might represent pancreatitis in the appropriate  clinical setting.   03-12-15: upper endoscopy: duodenal bulb 1.5-2.0 cm ulcer with clot at base. No active bleeding. Surrounding mucosa was edematous. Biopsy of the surrounding mucosa were done.   12-20-15: ct of abdomen and pelvis: 1. Loosely twisted sigmoid volvulus with transition point. Proximal distention is improved compared to radiography earlier today suggesting partial interval decompression. 2. Chronic findings are stable and described above.  12-20-15: flex sigmoidoscopy: chronic intermittent sigmoid volvulus. The site of volvulus was only slightly twisted during this exam, easily passed with colonoscope and the air filled dilated colon proximal to it was suctioned extensively. Her abdomen was flat, normal after the exam.   02-19-16: right lower extremity doppler: neg dvt  02-23-16: renal ultrasound: normal right kidney slightly small left kidney. Right 1.9 cm cyst no hydronephrosis or stones   03-19-16: chest x-ray: modest cardiomegaly with mild pulmonary venous congestion   09-05-16: right lower extremity doppler: DVT: neg   NO NEW EXAMS    LABS REVIEWED previous:     12-20-15: wbc 5.2; hgb 11.0; hct 32.4; mcv 101.3; plt 107; glucose 138; bun 31; creat 1.61; k+ 3.2; na++ 138; liver normal albumin 3.9; urine culture: e-coli: rocephin 12-21-15: wbc 3.9; hgb 9.8; hct 29.7; mcv 103.1; plt 98; glucose 108; bun 23; creat 1.15; k+ 3.2; na++ 143; mag 2.4 12-22-15: wbc 5.1; hgb 10.8; hct 32.3; mcv 103.2; plt 101; glucose 125; bun 18; creat 1.09; k+ 3.9; na++ 142  01-04-16: wbc 4.1; hgb 10.9; hct 32.4; mcv 100.5; plt 175; glucose 121; bun 13.2; creat 1.18; k+ 3.8; na++ 144; liver normal albumin 3.8 01-31-16; urine culture: e-coli:  ESBL invanz 02-15-16: wbc 4.6; hgb 11.3; hct 36.3; mcv 102.7; plt 128; glucose 160; bun 15.3; creat 1.65; k+ 4.5; na++ 143; liver normal albumin 3.9 02-19-16: glucose 116; bun 28.7; mcv 1.72; k+ 4.5; na++ 143 02-23-16; wbc 3.5 hgb 9.7; hct 30.4; mcv 101.8; plt 126; glucose  118; bun 25.6; creat 1.52; k+ 4.6; na++ 144  03-11-16: glucose 196; bun 18.4; creat 1.28; k+ 3.7; na++ 141  03-19-16: A/B neg for influenza 04-01-16: glucose 139; bun 15.7; creat 1.16; k+ 3.1; na++ 144 04-07-16: glucose 140; bun 18.2; creat 1.37; k+ 4.1; na++ 143  04-17-16: wbc 5.0; hgb 11.1; hct 33.3; mcv 100.6; plt 119; glucose 178; bun 19; creat 1.22; k+ 3.7; na++ 141; liver normal albumin 3.8 04-18-16: wbc 4.0; hgb 9.3; hct 28.8; mcv 101.1; plt 93; glucose 124; bun 17; creat 1.20; k+ 2.9; na++ 141; liver normal albumin 3.2; mag 1.7 phos 3.7; tsh 2.572; hgb a1c 6.9 04-20-16: wbc 5.5; hgb 10.2; hct 30.9; mcv 100.0; plt 97; glucose 103; bun 14; create 1.00; k+ 3.7; na++ 143; mag 1.8  04-24-16:glucose 132; bun 17; creat 1.05; k+ 4.3; na++ 138  08-12-16: wb 4.0; hgb 10.7; hct 32.1 ;mcv 103.2; plt 95; glucose 111 bun 33.0; creat 1.57; k+ 4.4 ;na++ 143; liver normal albumin 3.5    NEW LABS REVIEWED:   11-24-16: wbc 3.4; hgb 9.9; hct 29.7; mcv 106; plt 98; glucose 122; bun 17.4; creat 1.22; k+ 3.8; na++ 144; ca 8.8; chol 133; ldl 78; trig 75; hdl 41     Review of Systems  Constitutional: Negative for malaise/fatigue.  Respiratory: Negative for cough and shortness of breath.   Cardiovascular: Negative for chest pain, palpitations and leg swelling.  Gastrointestinal: Negative for abdominal pain, constipation and heartburn.  Musculoskeletal: Negative for back pain, joint pain and myalgias.  Skin: Negative.   Neurological: Negative for dizziness.  Psychiatric/Behavioral: The patient is not nervous/anxious and does not have insomnia.    Physical Exam  Constitutional: No distress.  Eyes: Conjunctivae are normal.  Neck: Neck supple. No JVD present. No thyromegaly present.  Cardiovascular: Normal rate, regular rhythm and intact distal pulses.   Respiratory: Effort normal and breath sounds normal. No respiratory distress. She has no wheezes.  GI: Soft. Bowel sounds are normal. She exhibits  distension. There is no tenderness.  Non tender   Musculoskeletal: She exhibits no edema.  Able to move all extremities   Lymphadenopathy:    She has no cervical adenopathy.  Neurological: She is alert.  Skin: Skin is warm and dry. She is not diaphoretic.  Psychiatric: She has a normal mood and affect.    ASSESSMENT/ PLAN:  1. Hypertension: is stable  b/p 137/75  will continue norvasc 2.5 mg daily coreg 3.15 mg daily   2. Chronic diastolic heart failure: is stable will continue demadex 20 mg daily   Will not make changes will monitor   3. Duodenal ulcer: is stable  will continue prilosec 20 mg  daily  Has zofran 8 mg every 8 hours as needed  Will continue pepcid 20 mg nightly to help with gerd symptoms and to avoid using PPI twice daily   4. Allergic rhinitis:is stable  will continue flonase nightly and claritin 10 mg daily  PREVIOUS PROBLEMS   5. Anemia: will continue iron daily hgb is 9.9  6. Chronic afib: will continue coreg 3.125 mg daily for rate control. Will continue asa 81mg  daily .    7. Chronic respiratory failure: is 02 dependent. Has duoneb  every  day  as needed   8.UTI: will continue cranberry twice daily    9. Chronic pain due to osteoarthritis: will continue lidoderm to lower back daily; will continue baclofen 5 mg nightly for leg spasticity has vicodin 5/325 mg1/2 tab every 6 hours as needed for pain   10. Depression: is stable receives benefit form lexapro 10 mg daily   11. Chronic constipation  Has recurrent sigmoid volvulus: will continue colace 200 mg daily linzess 145 mcg daily; senna twice daily; miralax daily   Her family has decided upon conservative treatment including repeat decompression by endoscope if needed.   12. Glaucoma: will continue  trusopt to both eyes three times daily; travatan to both eyes nightly   13. CKD stage III: bun/creat 17.4/1.22  14. Hypokalemia: k+ 3.8; will continue k+ 40 meq daily     MD is aware of resident's narcotic  use and is in agreement with current plan of care. We will attempt to wean resident as apropriate   Ok Edwards NP [facility]  Contact [phone] Monday through Friday 8am- 5pm  After hours call [phone]

## 2017-01-10 ENCOUNTER — Encounter: Payer: Self-pay | Admitting: Adult Health

## 2017-01-10 ENCOUNTER — Non-Acute Institutional Stay (SKILLED_NURSING_FACILITY): Payer: Medicare Other | Admitting: Adult Health

## 2017-01-10 DIAGNOSIS — J9611 Chronic respiratory failure with hypoxia: Secondary | ICD-10-CM | POA: Diagnosis not present

## 2017-01-10 DIAGNOSIS — M159 Polyosteoarthritis, unspecified: Secondary | ICD-10-CM

## 2017-01-10 DIAGNOSIS — G8929 Other chronic pain: Secondary | ICD-10-CM | POA: Diagnosis not present

## 2017-01-10 DIAGNOSIS — I482 Chronic atrial fibrillation, unspecified: Secondary | ICD-10-CM

## 2017-01-10 DIAGNOSIS — F325 Major depressive disorder, single episode, in full remission: Secondary | ICD-10-CM | POA: Diagnosis not present

## 2017-01-10 DIAGNOSIS — D638 Anemia in other chronic diseases classified elsewhere: Secondary | ICD-10-CM

## 2017-01-10 DIAGNOSIS — M150 Primary generalized (osteo)arthritis: Secondary | ICD-10-CM

## 2017-01-10 NOTE — Progress Notes (Signed)
Location:   [facility] Room Number: 009 Place of Service:  SNF (31)   CODE STATUS: DNr  Allergies  Allergen Reactions  . Aspirin Other (See Comments)    Maria Christian only    Chief Complaint  Patient presents with  . Medical Management of Chronic Issues    1 month follow up / headache left side of head    HPI:  Maria Christian is a 83 year old long term resident of this facility being seen for the management of her chronic illnesses:anemia; afib; chronic respiratory failure and chronic pain due to osteoarthritis. Maria Christian had been telling the nursing staff that Maria Christian has a left sided headache. At this time Maria Christian is denying any headache. Maria Christian continues to get out of bed on a daily basis. Maria Christian denies any changes in her appetite. Maria Christian denies any diarrhea or constipation; and denies any shortness of breath or cough.    Past Medical History:  Diagnosis Date  . Altered mental status   . Anemia   . Atrial fibrillation ([place])   . CKD (chronic kidney disease) stage 3, GFR 30-59 ml/min 02/27/2015  . Constipation 02/27/2015  . Coronary artery disease   . DEMENTIA   . Depression   . Diabetes mellitus   . Edema of lower extremity 07/13/11   right leg more swollen than left leg  . Encephalopathy   . Enlarged heart   . Esophageal dysmotility 07/02/12  . GERD (gastroesophageal reflux disease)   . History of adenomatous polyp of colon 06/24/99  . Horseshoe kidney   . Hyperlipemia   . Hypertension   . Pancreatic lesion 05/22/11   no further workup  per PCP/family due to age  . Parkinson disease ([place])   . Peripheral neuropathy   . Sigmoid volvulus ([place]) 05/24/2013  . Thrombophlebitis   . TIA (transient ischemic attack) 12/19/2012    Past Surgical History:  Procedure Laterality Date  . ABDOMINAL HYSTERECTOMY    . BOWEL DECOMPRESSION N/A 04/18/2016   Procedure: BOWEL DECOMPRESSION;  Surgeon: [name], MD;  [facility]: [facility];  Service: Endoscopy;  Laterality: N/A;  . BREAST SURGERY     2  benign tumors removed left breast  . CHOLECYSTECTOMY    . ESOPHAGEAL DILATION     several times by Dr. [name]  . ESOPHAGOGASTRODUODENOSCOPY (EGD) WITH ESOPHAGEAL DILATION N/A 08/01/2012   Procedure: ESOPHAGOGASTRODUODENOSCOPY (EGD) WITH ESOPHAGEAL DILATION;  Surgeon: [name], MD;  [facility];  Service: Endoscopy;  Laterality: N/A;  with c-arm savory dilators  . ESOPHAGOGASTRODUODENOSCOPY (EGD) WITH PROPOFOL N/A 03/12/2015   Procedure: ESOPHAGOGASTRODUODENOSCOPY (EGD) WITH PROPOFOL;  Surgeon: [name], MD;  [facility];  Service: Endoscopy;  Laterality: N/A;  . FLEXIBLE SIGMOIDOSCOPY N/A 05/24/2013   Procedure: FLEXIBLE SIGMOIDOSCOPY;  Surgeon: [name], MD;  [facility];  Service: Endoscopy;  Laterality: N/A;  . FLEXIBLE SIGMOIDOSCOPY N/A 05/26/2013   Procedure:  flex with decompression of sigmoid volvulus;  Surgeon: [name], MD;  [facility];  Service: Endoscopy;  Laterality: N/A;  . FLEXIBLE SIGMOIDOSCOPY N/A 12/20/2015   Procedure: FLEXIBLE SIGMOIDOSCOPY;  Surgeon: Milus Banister, MD;  [facility];  Service: Endoscopy;  Laterality: N/A;  . FLEXIBLE SIGMOIDOSCOPY N/A 04/17/2016   Procedure: FLEXIBLE SIGMOIDOSCOPY;  Surgeon: Ladene Artist, MD;  [facility];  Service: Endoscopy;  Laterality: N/A;  . FLEXIBLE SIGMOIDOSCOPY N/A 04/18/2016   Procedure: FLEXIBLE SIGMOIDOSCOPY;  Surgeon: [name], MD;  [facility];  Service: Endoscopy;  Laterality: N/A;  . FOOT SURGERY     benign tumors from foot  . NOSE SURGERY    . SHOULDER SURGERY  2001   left clavicle excision and acromioplasty  . TOTAL HIP ARTHROPLASTY      Social History   Social History  . Marital status: Widowed    Spouse name: N/A  . Number of children: 5  . Years of education: 10th   Occupational History  . Retired    Social History Main Topics  . Smoking status: Never Smoker  . Smokeless tobacco: Never Used  .  Alcohol use No  . Drug use: No  . Sexual activity: No   Other Topics Concern  . Not on file   Social History Narrative   Pt lives at [facility]    Caffeine Use: very small amount daily   Family History  Problem Relation Age of Onset  . Cancer Unknown   . Heart disease Unknown       VITAL SIGNS BP 124/68   Pulse 70   Temp (!) 97.4 F (36.3 C)   Resp 18   Ht 5\' 7"  (1.702 m)   Wt 162 lb 4.8 oz (73.6 kg)   SpO2 93%   BMI 25.42 kg/m   Patient's Medications  New Prescriptions   No medications on file  Previous Medications   AMLODIPINE (NORVASC) 2.5 MG TABLET    Take 2.5 mg by mouth every morning. Hold for BP < 100/60 or HR < 60   ASPIRIN EC 81 MG TABLET    Take 81 mg by mouth daily.   BACLOFEN (LIORESAL) 10 MG TABLET    Take 5 mg by mouth at bedtime.   BENZOCAINE-MENTHOL (CHLORASEPTIC) 6-10 MG LOZENGE    Take 1 lozenge by mouth as needed for sore throat.   BIOTIN PO    Give 1 tablet by mouth one time a day for nutrition   CARVEDILOL (COREG) 3.125 MG TABLET    Take 3.125 mg by mouth daily.   CHOLECALCIFEROL (VITAMIN D) 1000 UNITS TABLET    Take 1,000 Units by mouth every evening.    CRANBERRY 475 MG CAPS    Take 1 capsule by mouth 2 (two) times daily.    DOCUSATE SODIUM (COLACE) 100 MG CAPSULE    Take 200 mg by mouth at bedtime.    DORZOLAMIDE (TRUSOPT) 2 % OPHTHALMIC SOLUTION    Place 1 drop into both eyes 3 (three) times daily.   ESCITALOPRAM (LEXAPRO) 10 MG TABLET    Take 10 mg by mouth daily.   FAMOTIDINE (PEPCID) 20 MG TABLET    Take 20 mg by mouth at bedtime.   FEEDING SUPPLIES MISC    by Does not apply route. House 2.0 Med Pass - 120 cc by mouth  two times daily   FERROUS SULFATE 220 (44 FE) MG/5ML SOLUTION    Give 5 ml by mouth in the evening for supplement   FLUTICASONE (FLONASE) 50 MCG/ACT NASAL SPRAY    Place 2 sprays into both nostrils at bedtime.   GABAPENTIN (NEURONTIN) 100 MG CAPSULE    Take 100 mg by mouth at bedtime.   HYDROCODONE-ACETAMINOPHEN  (NORCO/VICODIN) 5-325 MG TABLET    Take one-half (1/2) tablet by mouth every 6 hours as needed for pain   IPRATROPIUM-ALBUTEROL (DUONEB) 0.5-2.5 (3) MG/3ML SOLN    Take 3 mLs by nebulization daily as needed.   LIDOCAINE (LIDODERM) 5 %    Place 1 patch onto the skin  daily. Apply 1 patch to left lower back/hip every morning.  Remove & Discard patch within 12 hours or as directed by MD   LINACLOTIDE (LINZESS) 145 MCG CAPS CAPSULE    Take 145 mcg by mouth daily before breakfast.    LORATADINE (CLARITIN) 10 MG TABLET    Take 10 mg by mouth daily before breakfast.    MULTIPLE VITAMINS-MINERALS (CERTAGEN PO)    Take 1 tablet by mouth daily.    OMEPRAZOLE (PRILOSEC) 20 MG CAPSULE    Take 20 mg by mouth daily.   ONDANSETRON (ZOFRAN) 8 MG TABLET    Take 8 mg by mouth every 8 (eight) hours as needed for nausea or vomiting.   POLYETHYLENE GLYCOL (MIRALAX / GLYCOLAX) PACKET    Take 17 g by mouth daily at 6 (six) AM.    POTASSIUM CHLORIDE ER 20 MEQ TBCR    Take 40 mEq by mouth daily.   SENNA (SENOKOT) 8.6 MG TABS TABLET    Take 1 tablet (8.6 mg total) by mouth 2 (two) times daily.   SODIUM CHLORIDE (OCEAN) 0.65 % SOLN NASAL SPRAY    Place 1 spray into both nostrils as directed. Four times daily And every 24 hours as needed to moisturize nasal passages.   TORSEMIDE (DEMADEX) 20 MG TABLET    Take 20 mg by mouth daily.   TRAVOPROST, BAK FREE, (TRAVATAN) 0.004 % SOLN OPHTHALMIC SOLUTION    Place 1 drop into both eyes daily at 8 pm.    VITAMIN B-12 (CYANOCOBALAMIN) 500 MCG TABLET    Take 500 mcg by mouth every evening.   Modified Medications   No medications on file  Discontinued Medications   SKIN PROTECTANTS, MISC. (CALAZIME SKIN PROTECTANT EX)    Apply 1 application topically daily. Buttocks protection     SIGNIFICANT DIAGNOSTIC EXAMS  02-15-15: abdominal ultrasound: 1. Gallbladder not visualized. 2. No hydronephrosis no renal calculus; 3. The pancreas not well visualized; which could be related to gassy  abdomen but an appearance which might represent pancreatitis in the appropriate clinical setting.   03-12-15: upper endoscopy: duodenal bulb 1.5-2.0 cm ulcer with clot at base. No active bleeding. Surrounding mucosa was edematous. Biopsy of the surrounding mucosa were done.   12-20-15: ct of abdomen and pelvis: 1. Loosely twisted sigmoid volvulus with transition point. Proximal distention is improved compared to radiography earlier today suggesting partial interval decompression. 2. Chronic findings are stable and described above.  12-20-15: flex sigmoidoscopy: chronic intermittent sigmoid volvulus. The site of volvulus was only slightly twisted during this exam, easily passed with colonoscope and the air filled dilated colon proximal to it was suctioned extensively. Her abdomen was flat, normal after the exam.   02-23-16: renal ultrasound: normal right kidney slightly small left kidney. Right 1.9 cm cyst no hydronephrosis or stones   09-05-16: right lower extremity doppler: DVT: neg   NO NEW EXAMS    LABS REVIEWED previous:     01-04-16: wbc 4.1; hgb 10.9; hct 32.4; mcv 100.5; plt 175; glucose 121; bun 13.2; creat 1.18; k+ 3.8; na++ 144; liver normal albumin 3.8 01-31-16; urine culture: e-coli: ESBL invanz 02-15-16: wbc 4.6; hgb 11.3; hct 36.3; mcv 102.7; plt 128; glucose 160; bun 15.3; creat 1.65; k+ 4.5; na++ 143; liver normal albumin 3.9 02-19-16: glucose 116; bun 28.7; mcv 1.72; k+ 4.5; na++ 143 02-23-16; wbc 3.5 hgb 9.7; hct 30.4; mcv 101.8; plt 126; glucose 118; bun 25.6; creat 1.52; k+ 4.6; na++ 144  03-11-16: glucose 196; bun 18.4; creat  1.28; k+ 3.7; na++ 141  03-19-16: A/B neg for influenza 04-01-16: glucose 139; bun 15.7; creat 1.16; k+ 3.1; na++ 144 04-07-16: glucose 140; bun 18.2; creat 1.37; k+ 4.1; na++ 143  04-17-16: wbc 5.0; hgb 11.1; hct 33.3; mcv 100.6; plt 119; glucose 178; bun 19; creat 1.22; k+ 3.7; na++ 141; liver normal albumin 3.8 04-18-16: wbc 4.0; hgb 9.3; hct 28.8; mcv  101.1; plt 93; glucose 124; bun 17; creat 1.20; k+ 2.9; na++ 141; liver normal albumin 3.2; mag 1.7 phos 3.7; tsh 2.572; hgb a1c 6.9 04-20-16: wbc 5.5; hgb 10.2; hct 30.9; mcv 100.0; plt 97; glucose 103; bun 14; create 1.00; k+ 3.7; na++ 143; mag 1.8  04-24-16:glucose 132; bun 17; creat 1.05; k+ 4.3; na++ 138  08-12-16: wb 4.0; hgb 10.7; hct 32.1 ;mcv 103.2; plt 95; glucose 111 bun 33.0; creat 1.57; k+ 4.4 ;na++ 143; liver normal albumin 3.5  11-24-16: wbc 3.4; hgb 9.9; hct 29.7; mcv 106; plt 98; glucose 122; bun 17.4; creat 1.22; k+ 3.8; na++ 144; ca 8.8; chol 133; ldl 78; trig 75; hdl 41   Review of Systems  Constitutional: Negative for malaise/fatigue.  Respiratory: Negative for cough and shortness of breath.   Cardiovascular: Negative for chest pain, palpitations and leg swelling.  Gastrointestinal: Negative for abdominal pain, constipation and heartburn.  Musculoskeletal: Negative for back pain, joint pain and myalgias.  Skin: Negative.   Neurological: Negative for dizziness.  Psychiatric/Behavioral: The patient is not nervous/anxious.       Physical Exam  Constitutional: No distress.  Eyes: Conjunctivae are normal.  Neck: Neck supple. No JVD present. No thyromegaly present.  Cardiovascular: Normal rate, regular rhythm and intact distal pulses.   Respiratory: Effort normal and breath sounds normal. No respiratory distress. Maria Christian has no wheezes.  GI: Soft. Bowel sounds are normal. Maria Christian exhibits distension (abdomen is slightly distended; ). There is no tenderness.  Musculoskeletal: Maria Christian exhibits no edema.  Able to move all extremities   Lymphadenopathy:    Maria Christian has no cervical adenopathy.  Neurological: Maria Christian is alert.  Skin: Skin is warm and dry. Maria Christian is not diaphoretic.  Psychiatric: Maria Christian has a normal mood and affect.     ASSESSMENT/ PLAN:  TODAY:   1. Anemia: will continue iron daily hgb is 9.9  2. Chronic afib: is stable will continue coreg 3.125 mg daily for rate control. Will  continue asa 81mg  daily .    3. Chronic respiratory failure: is stable  is 02 dependent. Has duoneb every  day  as needed   4. Chronic pain due to osteoarthritis:IS STABLE  will continue lidoderm to lower back daily; will continue baclofen 5 mg nightly for leg spasticity has vicodin 5/325 mg1/2 tab every 6 hours as needed for pain   5. Depression: is stable receives benefit form lexapro 10 mg daily   PREVIOUS PROBLEMS   6. Chronic constipation  Has recurrent sigmoid volvulus: will continue colace 200 mg daily linzess 145 mcg daily; senna twice daily; miralax daily   Her family has decided upon conservative treatment including repeat decompression by endoscope if needed.   7. Glaucoma: will continue  trusopt to both eyes three times daily; travatan to both eyes nightly   8. CKD stage III: bun/creat 17.4/1.22  9. Hypokalemia: k+ 3.8; will continue k+ 40 meq daily  10. Hypertension: is stable  b/p 137/75  will continue norvasc 2.5 mg daily coreg 3.15 mg daily   11. Chronic diastolic heart failure: is stable will continue demadex 20 mg  daily   Will not make changes will monitor   12. Duodenal ulcer: is stable  will continue prilosec 20 mg  daily  Has zofran 8 mg every 8 hours as needed  Will continue pepcid 20 mg nightly to help with gerd symptoms and to avoid using PPI twice daily   13. Allergic rhinitis:is stable  will continue flonase nightly and claritin 10 mg daily     MD is aware of resident's narcotic use and is in agreement with current plan of care. We will attempt to wean resident as apropriate    Ok Edwards NP [facility]  Contact [phone] Monday through Friday 8am- 5pm  After hours call [phone]

## 2017-01-15 ENCOUNTER — Other Ambulatory Visit: Payer: Self-pay

## 2017-01-15 ENCOUNTER — Non-Acute Institutional Stay (SKILLED_NURSING_FACILITY): Payer: Medicare Other | Admitting: Internal Medicine

## 2017-01-15 ENCOUNTER — Encounter: Payer: Self-pay | Admitting: Internal Medicine

## 2017-01-15 DIAGNOSIS — M5412 Radiculopathy, cervical region: Secondary | ICD-10-CM | POA: Diagnosis not present

## 2017-01-15 DIAGNOSIS — M150 Primary generalized (osteo)arthritis: Secondary | ICD-10-CM | POA: Diagnosis not present

## 2017-01-15 DIAGNOSIS — M542 Cervicalgia: Secondary | ICD-10-CM | POA: Diagnosis not present

## 2017-01-15 DIAGNOSIS — M159 Polyosteoarthritis, unspecified: Secondary | ICD-10-CM

## 2017-01-15 MED ORDER — HYDROCODONE-ACETAMINOPHEN 5-325 MG PO TABS: ORAL_TABLET | ORAL | 0 refills | Status: DC

## 2017-01-15 NOTE — Progress Notes (Deleted)
DATE:  Location:     [facility] Room Number: 295 A Place of Service:     Extended Emergency Contact Information Primary Emergency Contact: [name] [place] Phone: [phone] Relation: Son Secondary Emergency Contact: Roberts,Lynn Address: [address]          [place], [place] [address] [place] Phone: [phone] Mobile Phone: [phone] Relation: Daughter  Advanced Directive information Does Patient Have a Medical Advance Directive?: Yes, Type of Advance Directive: Out of facility DNR (pink MOST or yellow form), Pre-existing out of facility DNR order (yellow form or pink MOST form): Yellow form placed in chart (order not valid for inpatient use), Does patient want to make changes to medical advance directive?: No - Patient declined  Chief Complaint  Patient presents with  . Acute Visit    hand and neck pain    HPI:  ***  Past Medical History:  Diagnosis Date  . Altered mental status   . Anemia   . Atrial fibrillation ([place])   . CKD (chronic kidney disease) stage 3, GFR 30-59 ml/min 02/27/2015  . Constipation 02/27/2015  . Coronary artery disease   . DEMENTIA   . Depression   . Diabetes mellitus   . Edema of lower extremity 07/13/11   right leg more swollen than left leg  . Encephalopathy   . Enlarged heart   . Esophageal dysmotility 07/02/12  . GERD (gastroesophageal reflux disease)   . History of adenomatous polyp of colon 06/24/99  . Horseshoe kidney   . Hyperlipemia   . Hypertension   . Pancreatic lesion 05/22/11   no further workup  per PCP/family due to age  . Parkinson disease ([place])   . Peripheral neuropathy   . Sigmoid volvulus ([place]) 05/24/2013  . Thrombophlebitis   . TIA (transient ischemic attack) 12/19/2012    Past Surgical History:  Procedure Laterality Date  . ABDOMINAL HYSTERECTOMY    . BOWEL DECOMPRESSION N/A 04/18/2016   Procedure: BOWEL DECOMPRESSION;  Surgeon: [name], MD;   [facility];  Service: Endoscopy;  Laterality: N/A;  . BREAST SURGERY     2 benign tumors removed left breast  . CHOLECYSTECTOMY    . ESOPHAGEAL DILATION     several times by Dr. [name]  . ESOPHAGOGASTRODUODENOSCOPY (EGD) WITH ESOPHAGEAL DILATION N/A 08/01/2012   Procedure: ESOPHAGOGASTRODUODENOSCOPY (EGD) WITH ESOPHAGEAL DILATION;  Surgeon: [name], MD;  [facility];  Service: Endoscopy;  Laterality: N/A;  with c-arm savory dilators  . ESOPHAGOGASTRODUODENOSCOPY (EGD) WITH PROPOFOL N/A 03/12/2015   Procedure: ESOPHAGOGASTRODUODENOSCOPY (EGD) WITH PROPOFOL;  Surgeon: [name], MD;  [facility];  Service: Endoscopy;  Laterality: N/A;  . FLEXIBLE SIGMOIDOSCOPY N/A 05/24/2013   Procedure: FLEXIBLE SIGMOIDOSCOPY;  Surgeon: [name], MD;  [facility];  Service: Endoscopy;  Laterality: N/A;  . FLEXIBLE SIGMOIDOSCOPY N/A 05/26/2013   Procedure:  flex with decompression of sigmoid volvulus;  Surgeon: [name], MD;  [facility];  Service: Endoscopy;  Laterality: N/A;  . FLEXIBLE SIGMOIDOSCOPY N/A 12/20/2015   Procedure: FLEXIBLE SIGMOIDOSCOPY;  Surgeon: Milus Banister, MD;  [facility];  Service: Endoscopy;  Laterality: N/A;  . FLEXIBLE SIGMOIDOSCOPY N/A 04/17/2016   Procedure: FLEXIBLE SIGMOIDOSCOPY;  Surgeon: Ladene Artist, MD;  [facility];  Service: Endoscopy;  Laterality: N/A;  . FLEXIBLE SIGMOIDOSCOPY N/A 04/18/2016   Procedure: FLEXIBLE SIGMOIDOSCOPY;  Surgeon: [name], MD;  [facility];  Service: Endoscopy;  Laterality: N/A;  . FOOT SURGERY     benign tumors from foot  . NOSE SURGERY    . SHOULDER SURGERY  2001   left clavicle excision and acromioplasty  . TOTAL HIP ARTHROPLASTY      Patient Care Team: [name], DO as PCP - General (Internal Medicine) Nyoka Cowden Phylis Bougie, NP as Nurse Practitioner (Nurse Practitioner)  Social History   Social History  .  Marital status: Widowed    Spouse name: N/A  . Number of children: 5  . Years of education: 10th   Occupational History  . Retired    Social History Main Topics  . Smoking status: Never Smoker  . Smokeless tobacco: Never Used  . Alcohol use No  . Drug use: No  . Sexual activity: No   Other Topics Concern  . Not on file   Social History Narrative   Pt lives at [facility]    Caffeine Use: very small amount daily     reports that she has never smoked. She has never used smokeless tobacco. She reports that she does not drink alcohol or use drugs.  Family History  Problem Relation Age of Onset  . Cancer Unknown   . Heart disease Unknown    Family Status  Relation Status  . Mother Deceased at age 58       blood clots  . Father Deceased at age 71       cirrohis of the liver  . Unknown (Not Specified)    Immunization History  Administered Date(s) Administered  . Influenza-Unspecified 07/13/2015, 03/09/2016  . PPD Test 07/28/2013, 02/18/2016, 02/25/2016    Allergies  Allergen Reactions  . Aspirin Other (See Comments)    G.I. Upset only    Medications: Patient's Medications  New Prescriptions   No medications on file  Previous Medications   AMLODIPINE (NORVASC) 2.5 MG TABLET    Take 2.5 mg by mouth every morning. Hold for BP < 100/60 or HR < 60   ASPIRIN EC 81 MG TABLET    Take 81 mg by mouth daily.   BACLOFEN (LIORESAL) 10 MG TABLET    Take 5 mg by mouth at bedtime.   BENZOCAINE-MENTHOL (CHLORASEPTIC) 6-10 MG LOZENGE    Take 1 lozenge by mouth as needed for sore throat.   BIOTIN PO    Give 1 tablet by mouth one time a day for nutrition   CARVEDILOL (COREG) 3.125 MG TABLET    Take 3.125 mg by mouth daily.   CHOLECALCIFEROL (VITAMIN D) 1000 UNITS TABLET    Take 1,000 Units by mouth every evening.    CRANBERRY 475 MG CAPS    Take 1 capsule by mouth 2 (two) times daily.    DOCUSATE SODIUM (COLACE) 100 MG CAPSULE    Take 200 mg by mouth at bedtime.    DORZOLAMIDE  (TRUSOPT) 2 % OPHTHALMIC SOLUTION    Place 1 drop into both eyes 3 (three) times daily.   ESCITALOPRAM (LEXAPRO) 10 MG TABLET    Take 10 mg by mouth daily.   FAMOTIDINE (PEPCID) 20 MG TABLET    Take 20 mg by mouth at bedtime.   FERROUS SULFATE 220 (44 FE) MG/5ML SOLUTION    Give 5 ml by mouth in the evening for supplement   FLUTICASONE (FLONASE) 50 MCG/ACT NASAL SPRAY    Place 2 sprays into both nostrils at bedtime.   GABAPENTIN (NEURONTIN) 100 MG CAPSULE    Take 100 mg by mouth at  bedtime.   HYDROCODONE-ACETAMINOPHEN (NORCO/VICODIN) 5-325 MG TABLET    Take one-half (1/2) tablet by mouth every 6 hours as needed for pain   IPRATROPIUM-ALBUTEROL (DUONEB) 0.5-2.5 (3) MG/3ML SOLN    Take 3 mLs by nebulization daily as needed.   LIDOCAINE (LIDODERM) 5 %    Place 1 patch onto the skin daily. Apply 1 patch to left lower back/hip every morning.  Remove & Discard patch within 12 hours or as directed by MD   LINACLOTIDE (LINZESS) 145 MCG CAPS CAPSULE    Take 145 mcg by mouth daily before breakfast.    LORATADINE (CLARITIN) 10 MG TABLET    Take 10 mg by mouth daily before breakfast.    MULTIPLE VITAMINS-MINERALS (CERTAGEN PO)    Take 1 tablet by mouth daily.    OMEPRAZOLE (PRILOSEC) 20 MG CAPSULE    Take 20 mg by mouth daily.   ONDANSETRON (ZOFRAN) 8 MG TABLET    Take 8 mg by mouth every 8 (eight) hours as needed for nausea or vomiting.   POLYETHYLENE GLYCOL (MIRALAX / GLYCOLAX) PACKET    Take 17 g by mouth daily at 6 (six) AM.    POTASSIUM CHLORIDE ER 20 MEQ TBCR    Take 40 mEq by mouth daily.   SENNA (SENOKOT) 8.6 MG TABS TABLET    Take 1 tablet (8.6 mg total) by mouth 2 (two) times daily.   SODIUM CHLORIDE (OCEAN) 0.65 % SOLN NASAL SPRAY    Place 1 spray into both nostrils as directed. Four times daily And every 24 hours as needed to moisturize nasal passages.   TORSEMIDE (DEMADEX) 20 MG TABLET    Take 20 mg by mouth daily.   TRAVOPROST, BAK FREE, (TRAVATAN) 0.004 % SOLN OPHTHALMIC SOLUTION    Place 1  drop into both eyes daily at 8 pm.    VITAMIN B-12 (CYANOCOBALAMIN) 500 MCG TABLET    Take 500 mcg by mouth every evening.   Modified Medications   No medications on file  Discontinued Medications   FEEDING SUPPLIES MISC    by Does not apply route. House 2.0 Med Pass - 120 cc by mouth  two times daily    Review of Systems  Vitals:   01/15/17 1436  BP: (!) 150/73  Pulse: 70  Resp: 18  Temp: (!) 97.4 F (36.3 C)  SpO2: 98%  Weight: 165 lb (74.8 kg)  Height: 5\' 7"  (1.702 m)   Body mass index is 25.84 kg/m.  Physical Exam   Labs reviewed: Abstract on 11/29/2016  Component Date Value Ref Range Status  . Triglycerides 11/24/2016 75  40 - 160 Final  . Cholesterol 11/24/2016 133  0 - 200 Final  . HDL 11/24/2016 41  35 - 70 Final  . LDL Cholesterol 11/24/2016 78   Final  Abstract on 11/29/2016  Component Date Value Ref Range Status  . Hemoglobin 11/24/2016 9.9* 12.0 - 16.0 Final  . HCT 11/24/2016 30* 36 - 46 Final  . Platelets 11/24/2016 98* 150 - 399 Final  . WBC 11/24/2016 3.4   Final  . Glucose 11/24/2016 122   Final  . BUN 11/24/2016 17  4 - 21 Final  . Creatinine 11/24/2016 1.2* 0.5 - 1.1 Final  . Potassium 11/24/2016 3.8  3.4 - 5.3 Final  . Sodium 11/24/2016 144  137 - 147 Final    No results found.   Assessment/Plan    [facility] [place],  [id number] [phone] Cell (Monday-Friday 8 AM - 5 PM) [phone] After 5 PM and follow prompts

## 2017-01-15 NOTE — Progress Notes (Signed)
Patient ID: Maria Christian, female   DOB: 10-26-1924, 83 y.o.   MRN: [id number]     DATE:  January 15, 2017  Location:   [facility] Room Number: 973 A Place of Service:  SNF (31)  Extended Emergency Contact Information Primary Emergency Contact: [name] [place] Phone: [phone] Relation: Son Secondary Emergency Contact: Roberts,Lynn Address: [address]          [place], [place] [address] [place] Phone: [phone] Mobile Phone: [phone] Relation: Daughter  Advanced Directive information Does Patient Have a Medical Advance Directive?: Yes, Type of Advance Directive: Out of facility DNR (pink MOST or yellow form), Pre-existing out of facility DNR order (yellow form or pink MOST form): Yellow form placed in chart (order not valid for inpatient use), Does patient want to make changes to medical advance directive?: No - Patient declined  Chief Complaint  Patient presents with  . Acute Visit    hand and neck pain    HPI:  83 yo female long term resident seen today for HA and neck pain with intermittent right UE numbness. Her last C spine xray was in 2017 and revealed arthritic changes. She has been refusing pain meds due to they cause increased sedation.  She is a poor historian due to dementia. Hx obtained from chart. No falls or other trauma  Past Medical History:  Diagnosis Date  . Altered mental status   . Anemia   . Atrial fibrillation ([place])   . CKD (chronic kidney disease) stage 3, GFR 30-59 ml/min 02/27/2015  . Constipation 02/27/2015  . Coronary artery disease   . DEMENTIA   . Depression   . Diabetes mellitus   . Edema of lower extremity 07/13/11   right leg more swollen than left leg  . Encephalopathy   . Enlarged heart   . Esophageal dysmotility 07/02/12  . GERD (gastroesophageal reflux disease)   . History of adenomatous polyp of colon 06/24/99  . Horseshoe kidney   . Hyperlipemia   . Hypertension    . Pancreatic lesion 05/22/11   no further workup  per PCP/family due to age  . Parkinson disease ([place])   . Peripheral neuropathy   . Sigmoid volvulus ([place]) 05/24/2013  . Thrombophlebitis   . TIA (transient ischemic attack) 12/19/2012    Past Surgical History:  Procedure Laterality Date  . ABDOMINAL HYSTERECTOMY    . BOWEL DECOMPRESSION N/A 04/18/2016   Procedure: BOWEL DECOMPRESSION;  Surgeon: [name], MD;  Location: [facility];  Service: Endoscopy;  Laterality: N/A;  . BREAST SURGERY     2 benign tumors removed left breast  . CHOLECYSTECTOMY    . ESOPHAGEAL DILATION     several times by Dr. [name]  . ESOPHAGOGASTRODUODENOSCOPY (EGD) WITH ESOPHAGEAL DILATION N/A 08/01/2012   Procedure: ESOPHAGOGASTRODUODENOSCOPY (EGD) WITH ESOPHAGEAL DILATION;  Surgeon: [name], MD;  [facility];  Service: Endoscopy;  Laterality: N/A;  with c-arm savory dilators  . ESOPHAGOGASTRODUODENOSCOPY (EGD) WITH PROPOFOL N/A 03/12/2015   Procedure: ESOPHAGOGASTRODUODENOSCOPY (EGD) WITH PROPOFOL;  Surgeon: [name], MD;  [facility];  Service: Endoscopy;  Laterality: N/A;  . FLEXIBLE SIGMOIDOSCOPY N/A 05/24/2013   Procedure: FLEXIBLE SIGMOIDOSCOPY;  Surgeon: [name], MD;  [facility];  Service: Endoscopy;  Laterality: N/A;  . FLEXIBLE SIGMOIDOSCOPY N/A 05/26/2013   Procedure:  flex with decompression of sigmoid volvulus;  Surgeon: [name], MD;  [facility];  Service:  Endoscopy;  Laterality: N/A;  . FLEXIBLE SIGMOIDOSCOPY N/A 12/20/2015   Procedure: FLEXIBLE SIGMOIDOSCOPY;  Surgeon: Milus Banister, MD;  [facility];  Service: Endoscopy;  Laterality: N/A;  . FLEXIBLE SIGMOIDOSCOPY N/A 04/17/2016   Procedure: FLEXIBLE SIGMOIDOSCOPY;  Surgeon: Ladene Artist, MD;  [facility];  Service: Endoscopy;  Laterality: N/A;  . FLEXIBLE SIGMOIDOSCOPY N/A 04/18/2016   Procedure: FLEXIBLE SIGMOIDOSCOPY;  Surgeon: [name], MD;  [facility];  Service: Endoscopy;  Laterality: N/A;  . FOOT SURGERY     benign tumors from foot  . NOSE SURGERY    . SHOULDER SURGERY  2001   left clavicle excision and acromioplasty  . TOTAL HIP ARTHROPLASTY      Patient Care Team: [name], DO as PCP - General (Internal Medicine) Nyoka Cowden Phylis Bougie, NP as Nurse Practitioner (Nurse Practitioner)  Social History   Social History  . Marital status: Widowed    Spouse name: N/A  . Number of children: 5  . Years of education: 10th   Occupational History  . Retired    Social History Main Topics  . Smoking status: Never Smoker  . Smokeless tobacco: Never Used  . Alcohol use No  . Drug use: No  . Sexual activity: No   Other Topics Concern  . Not on file   Social History Narrative   Pt lives at [place]    Caffeine Use: very small amount daily     reports that she has never smoked. She has never used smokeless tobacco. She reports that she does not drink alcohol or use drugs.  Family History  Problem Relation Age of Onset  . Cancer Unknown   . Heart disease Unknown    Family Status  Relation Status  . Mother Deceased at age 63       blood clots  . Father Deceased at age 47       cirrohis of the liver  . Unknown (Not Specified)    Immunization History  Administered Date(s) Administered  . Influenza-Unspecified 07/13/2015, 03/09/2016  . PPD Test 07/28/2013, 02/18/2016, 02/25/2016    Allergies  Allergen Reactions  . Aspirin Other (See Comments)    G.I. Upset only    Medications: Patient's Medications  New Prescriptions   No medications on file  Previous Medications   AMLODIPINE (NORVASC) 2.5 MG TABLET    Take 2.5 mg by mouth every morning. Hold for BP < 100/60 or HR < 60   ASPIRIN EC 81 MG TABLET    Take 81 mg by mouth daily.   BACLOFEN (LIORESAL) 10 MG TABLET    Take 5 mg by mouth at bedtime.   BENZOCAINE-MENTHOL (CHLORASEPTIC) 6-10 MG LOZENGE    Take 1 lozenge by mouth as  needed for sore throat.   BIOTIN PO    Give 1 tablet by mouth one time a day for nutrition   CARVEDILOL (COREG) 3.125 MG TABLET    Take 3.125 mg by mouth daily.   CHOLECALCIFEROL (VITAMIN D) 1000 UNITS TABLET    Take 1,000 Units by mouth every evening.    CRANBERRY 475 MG CAPS    Take 1 capsule by mouth 2 (two) times daily.    DOCUSATE SODIUM (COLACE) 100 MG CAPSULE    Take 200 mg by mouth at bedtime.    DORZOLAMIDE (TRUSOPT) 2 % OPHTHALMIC SOLUTION    Place 1 drop into both eyes 3 (three) times daily.   ESCITALOPRAM (LEXAPRO) 10 MG TABLET  Take 10 mg by mouth daily.   FAMOTIDINE (PEPCID) 20 MG TABLET    Take 20 mg by mouth at bedtime.   FERROUS SULFATE 220 (44 FE) MG/5ML SOLUTION    Give 5 ml by mouth in the evening for supplement   FLUTICASONE (FLONASE) 50 MCG/ACT NASAL SPRAY    Place 2 sprays into both nostrils at bedtime.   GABAPENTIN (NEURONTIN) 100 MG CAPSULE    Take 100 mg by mouth at bedtime.   HYDROCODONE-ACETAMINOPHEN (NORCO/VICODIN) 5-325 MG TABLET    Take one-half (1/2) tablet by mouth every 6 hours as needed for pain   IPRATROPIUM-ALBUTEROL (DUONEB) 0.5-2.5 (3) MG/3ML SOLN    Take 3 mLs by nebulization daily as needed.   LIDOCAINE (LIDODERM) 5 %    Place 1 patch onto the skin daily. Apply 1 patch to left lower back/hip every morning.  Remove & Discard patch within 12 hours or as directed by MD   LINACLOTIDE (LINZESS) 145 MCG CAPS CAPSULE    Take 145 mcg by mouth daily before breakfast.    LORATADINE (CLARITIN) 10 MG TABLET    Take 10 mg by mouth daily before breakfast.    MULTIPLE VITAMINS-MINERALS (CERTAGEN PO)    Take 1 tablet by mouth daily.    OMEPRAZOLE (PRILOSEC) 20 MG CAPSULE    Take 20 mg by mouth daily.   ONDANSETRON (ZOFRAN) 8 MG TABLET    Take 8 mg by mouth every 8 (eight) hours as needed for nausea or vomiting.   POLYETHYLENE GLYCOL (MIRALAX / GLYCOLAX) PACKET    Take 17 g by mouth daily at 6 (six) AM.    POTASSIUM CHLORIDE ER 20 MEQ TBCR    Take 40 mEq by mouth daily.    SENNA (SENOKOT) 8.6 MG TABS TABLET    Take 1 tablet (8.6 mg total) by mouth 2 (two) times daily.   SODIUM CHLORIDE (OCEAN) 0.65 % SOLN NASAL SPRAY    Place 1 spray into both nostrils as directed. Four times daily And every 24 hours as needed to moisturize nasal passages.   TORSEMIDE (DEMADEX) 20 MG TABLET    Take 20 mg by mouth daily.   TRAVOPROST, BAK FREE, (TRAVATAN) 0.004 % SOLN OPHTHALMIC SOLUTION    Place 1 drop into both eyes daily at 8 pm.    VITAMIN B-12 (CYANOCOBALAMIN) 500 MCG TABLET    Take 500 mcg by mouth every evening.   Modified Medications   No medications on file  Discontinued Medications   FEEDING SUPPLIES MISC    by Does not apply route. House 2.0 Med Pass - 120 cc by mouth  two times daily    Review of Systems  Unable to perform ROS: Dementia    Vitals:   01/15/17 1436  BP: (!) 150/73  Pulse: 70  Resp: 18  Temp: (!) 97.4 F (36.3 C)  SpO2: 98%  Weight: 165 lb (74.8 kg)  Height: 5\' 7"  (1.702 m)   Body mass index is 25.84 kg/m.  Physical Exam  Constitutional: She appears well-developed.  Frail appearing in NAD sitting in w/c  HENT:  Head:    Neck: Neck supple. Muscular tenderness present. No spinous process tenderness present. No neck rigidity. Decreased range of motion present.  Musculoskeletal: She exhibits edema and tenderness.  Lymphadenopathy:    She has no cervical adenopathy.  Neurological: She is alert.  Skin: Skin is warm and dry. No rash noted.  Psychiatric: She has a normal mood and affect. Her behavior is normal.  Labs reviewed: Abstract on 11/29/2016  Component Date Value Ref Range Status  . Triglycerides 11/24/2016 75  40 - 160 Final  . Cholesterol 11/24/2016 133  0 - 200 Final  . HDL 11/24/2016 41  35 - 70 Final  . LDL Cholesterol 11/24/2016 78   Final  Abstract on 11/29/2016  Component Date Value Ref Range Status  . Hemoglobin 11/24/2016 9.9* 12.0 - 16.0 Final  . HCT 11/24/2016 30* 36 - 46 Final  . Platelets 11/24/2016  98* 150 - 399 Final  . WBC 11/24/2016 3.4   Final  . Glucose 11/24/2016 122   Final  . BUN 11/24/2016 17  4 - 21 Final  . Creatinine 11/24/2016 1.2* 0.5 - 1.1 Final  . Potassium 11/24/2016 3.8  3.4 - 5.3 Final  . Sodium 11/24/2016 144  137 - 147 Final    No results found.   Assessment/Plan   ICD-10-CM   1. Neck pain M54.2   2. Cervical radiculopathy M54.12   3. Primary osteoarthritis involving multiple joints M15.0    Check C spine xray 4 view  D/c Norco  rx Norco 5/325 take 0.5 tab po qAM and q8hrs prn mod-sev pain - hold for sedation  Cont other meds as ordered  T/c topical agent such as biofreeze  Will follow  Harvis Mabus S. Perlie Gold  [facility] [address] [place], [place] [address] [phone] Cell (Monday-Friday 8 AM - 5 PM) [phone] After 5 PM and follow prompts

## 2017-01-15 NOTE — Telephone Encounter (Signed)
RX faxed to [facility] @ [phone], phone number [phone] 

## 2017-01-16 DIAGNOSIS — B351 Tinea unguium: Secondary | ICD-10-CM | POA: Diagnosis not present

## 2017-01-16 DIAGNOSIS — E1040 Type 1 diabetes mellitus with diabetic neuropathy, unspecified: Secondary | ICD-10-CM | POA: Diagnosis not present

## 2017-01-16 DIAGNOSIS — M79674 Pain in right toe(s): Secondary | ICD-10-CM | POA: Diagnosis not present

## 2017-01-16 DIAGNOSIS — R600 Localized edema: Secondary | ICD-10-CM | POA: Diagnosis not present

## 2017-01-16 DIAGNOSIS — L84 Corns and callosities: Secondary | ICD-10-CM | POA: Diagnosis not present

## 2017-01-16 DIAGNOSIS — L603 Nail dystrophy: Secondary | ICD-10-CM | POA: Diagnosis not present

## 2017-01-17 ENCOUNTER — Non-Acute Institutional Stay (INDEPENDENT_AMBULATORY_CARE_PROVIDER_SITE_OTHER): Payer: Medicare Other | Admitting: Family Medicine

## 2017-01-17 ENCOUNTER — Encounter: Payer: Self-pay | Admitting: Family Medicine

## 2017-01-17 DIAGNOSIS — K562 Volvulus: Secondary | ICD-10-CM

## 2017-01-17 NOTE — Progress Notes (Signed)
Note generated to remove patient from [facility] Medicine teaching service census.

## 2017-01-24 DIAGNOSIS — H04123 Dry eye syndrome of bilateral lacrimal glands: Secondary | ICD-10-CM | POA: Diagnosis not present

## 2017-01-24 DIAGNOSIS — H401134 Primary open-angle glaucoma, bilateral, indeterminate stage: Secondary | ICD-10-CM | POA: Diagnosis not present

## 2017-01-24 DIAGNOSIS — E119 Type 2 diabetes mellitus without complications: Secondary | ICD-10-CM | POA: Diagnosis not present

## 2017-01-24 DIAGNOSIS — H353131 Nonexudative age-related macular degeneration, bilateral, early dry stage: Secondary | ICD-10-CM | POA: Diagnosis not present

## 2017-01-30 LAB — CBC AND DIFFERENTIAL
HCT: 29 — AB (ref 36–46)
Hemoglobin: 9.9 — AB (ref 12.0–16.0)
NEUTROS ABS: 2
PLATELETS: 91 — AB (ref 150–399)
WBC: 4.4

## 2017-01-30 LAB — BASIC METABOLIC PANEL
BUN: 25 — AB (ref 4–21)
Creatinine: 1.5 — AB (ref 0.5–1.1)
GLUCOSE: 232
Potassium: 3.8 (ref 3.4–5.3)
Sodium: 139 (ref 137–147)

## 2017-01-31 ENCOUNTER — Non-Acute Institutional Stay (SKILLED_NURSING_FACILITY): Payer: Medicare Other | Admitting: Adult Health

## 2017-01-31 ENCOUNTER — Encounter: Payer: Self-pay | Admitting: Adult Health

## 2017-01-31 DIAGNOSIS — G44229 Chronic tension-type headache, not intractable: Secondary | ICD-10-CM

## 2017-01-31 DIAGNOSIS — E876 Hypokalemia: Secondary | ICD-10-CM | POA: Diagnosis not present

## 2017-01-31 NOTE — Progress Notes (Signed)
Location:   [place] Room Number: 765 A Place of Service:  SNF (31)   CODE STATUS: DNR  Allergies  Allergen Reactions  . Aspirin Other (See Comments)    Maria Christian only    Chief Complaint  Patient presents with  . Acute Visit    Right Leg Edema    HPI:  She has had several concerns. Her family is concerned that she is more confused; feel as though she has an uti due to her confused. I did educate on signs and symptoms of uti and that confusion alone is not an infectious process. They did verbalize understanding and did agree. I did tell them that I would check blood work to look for elevated wbc and electrolyte imbalances. Her k+ is 3.8; her son insists that she needs her k+ at least 4.0 or she will become confused. She is having flight of thoughts this morning. She is worried about the edema in her right leg; she has seen vascular services back in May and determined that she needs to keep her right leg elevated as much as possible. There are no reports of fever present; he appetite remains good.  She does complain of frequent headaches in the AM.  She does take 1/2 vicodin 5/325 mg every day for her headaches she tells me that this does work.   Past Medical History:  Diagnosis Date  . Altered mental status   . Anemia   . Atrial fibrillation ([place])   . CKD (chronic kidney disease) stage 3, GFR 30-59 ml/min 02/27/2015  . Constipation 02/27/2015  . Coronary artery disease   . DEMENTIA   . Depression   . Diabetes mellitus   . Edema of lower extremity 07/13/11   right leg more swollen than left leg  . Encephalopathy   . Enlarged heart   . Esophageal dysmotility 07/02/12  . GERD (gastroesophageal reflux disease)   . History of adenomatous polyp of colon 06/24/99  . Horseshoe kidney   . Hyperlipemia   . Hypertension   . Pancreatic lesion 05/22/11   no further workup  per PCP/family due to age  . Parkinson disease ([place])   . Peripheral neuropathy   . Sigmoid volvulus  ([place]) 05/24/2013  . Thrombophlebitis   . TIA (transient ischemic attack) 12/19/2012    Past Surgical History:  Procedure Laterality Date  . ABDOMINAL HYSTERECTOMY    . BOWEL DECOMPRESSION N/A 04/18/2016   Procedure: BOWEL DECOMPRESSION;  Surgeon: [name], MD;  [facility];  Service: Endoscopy;  Laterality: N/A;  . BREAST SURGERY     2 benign tumors removed left breast  . CHOLECYSTECTOMY    . ESOPHAGEAL DILATION     several times by Dr. [name]  . ESOPHAGOGASTRODUODENOSCOPY (EGD) WITH ESOPHAGEAL DILATION N/A 08/01/2012   Procedure: ESOPHAGOGASTRODUODENOSCOPY (EGD) WITH ESOPHAGEAL DILATION;  Surgeon: [name], MD;  [facility];  Service: Endoscopy;  Laterality: N/A;  with c-arm savory dilators  . ESOPHAGOGASTRODUODENOSCOPY (EGD) WITH PROPOFOL N/A 03/12/2015   Procedure: ESOPHAGOGASTRODUODENOSCOPY (EGD) WITH PROPOFOL;  Surgeon: [name], MD;  [facility];  Service: Endoscopy;  Laterality: N/A;  . FLEXIBLE SIGMOIDOSCOPY N/A 05/24/2013   Procedure: FLEXIBLE SIGMOIDOSCOPY;  Surgeon: [name], MD;  [facility];  Service: Endoscopy;  Laterality: N/A;  . FLEXIBLE SIGMOIDOSCOPY N/A 05/26/2013   Procedure:  flex with decompression of sigmoid volvulus;  Surgeon: [name], MD;  [facility];  Service: Endoscopy;  Laterality: N/A;  .  FLEXIBLE SIGMOIDOSCOPY N/A 12/20/2015   Procedure: FLEXIBLE SIGMOIDOSCOPY;  Surgeon: Milus Banister, MD;  [facility];  Service: Endoscopy;  Laterality: N/A;  . FLEXIBLE SIGMOIDOSCOPY N/A 04/17/2016   Procedure: FLEXIBLE SIGMOIDOSCOPY;  Surgeon: Ladene Artist, MD;  [facility];  Service: Endoscopy;  Laterality: N/A;  . FLEXIBLE SIGMOIDOSCOPY N/A 04/18/2016   Procedure: FLEXIBLE SIGMOIDOSCOPY;  Surgeon: [name], MD;  [facility];  Service: Endoscopy;  Laterality: N/A;  . FOOT SURGERY     benign tumors from foot  . NOSE SURGERY    . SHOULDER SURGERY   2001   left clavicle excision and acromioplasty  . TOTAL HIP ARTHROPLASTY      Social History   Social History  . Marital status: Widowed    Spouse name: N/A  . Number of children: 5  . Years of education: 10th   Occupational History  . Retired    Social History Main Topics  . Smoking status: Never Smoker  . Smokeless tobacco: Never Used  . Alcohol use No  . Drug use: No  . Sexual activity: No   Other Topics Concern  . Not on file   Social History Narrative   Pt lives at [place]    Caffeine Use: very small amount daily   Family History  Problem Relation Age of Onset  . Cancer Unknown   . Heart disease Unknown       VITAL SIGNS BP 130/70   Pulse 66   Temp 98 F (36.7 C)   Resp 17   Ht 5\' 7"  (1.702 m)   Wt 157 lb 6.4 oz (71.4 kg)   SpO2 93%   BMI 24.65 kg/m   Patient's Medications  New Prescriptions   No medications on file  Previous Medications   AMLODIPINE (NORVASC) 2.5 MG TABLET    Take 2.5 mg by mouth every morning. Hold for BP < 100/60 or HR < 60   ASPIRIN EC 81 MG TABLET    Take 81 mg by mouth daily.   BACLOFEN (LIORESAL) 10 MG TABLET    Take 5 mg by mouth at bedtime.   BENZOCAINE-MENTHOL (CHLORASEPTIC) 6-10 MG LOZENGE    Take 1 lozenge by mouth as needed for sore throat.   CARVEDILOL (COREG) 3.125 MG TABLET    Take 3.125 mg by mouth daily.   CHOLECALCIFEROL (VITAMIN D) 1000 UNITS TABLET    Take 1,000 Units by mouth every evening.    CRANBERRY 475 MG CAPS    Take 1 capsule by mouth 2 (two) times daily.    DOCUSATE SODIUM (COLACE) 100 MG CAPSULE    Take 200 mg by mouth at bedtime.    DORZOLAMIDE (TRUSOPT) 2 % OPHTHALMIC SOLUTION    Place 1 drop into both eyes 3 (three) times daily.   ESCITALOPRAM (LEXAPRO) 10 MG TABLET    Take 10 mg by mouth daily.   FAMOTIDINE (PEPCID) 20 MG TABLET    Take 20 mg by mouth at bedtime.   FERROUS SULFATE 220 (44 FE) MG/5ML SOLUTION    Give 5 ml by mouth in the evening for supplement   FLUTICASONE (FLONASE) 50  MCG/ACT NASAL SPRAY    Place 2 sprays into both nostrils at bedtime.   GABAPENTIN (NEURONTIN) 100 MG CAPSULE    Take 100 mg by mouth at bedtime.   HYDROCODONE-ACETAMINOPHEN (NORCO/VICODIN) 5-325 MG TABLET    Take 0.5 tablet by mouth every morning for pain.  Hold for sedation.    May take  0.5 tablet by mouth every 8 hours as needed for moderate to severe pain   IPRATROPIUM-ALBUTEROL (DUONEB) 0.5-2.5 (3) MG/3ML SOLN    Take 3 mLs by nebulization daily as needed.   LIDOCAINE (LIDODERM) 5 %    Place 1 patch onto the skin daily. Apply 1 patch to left lower back/hip every morning.  Remove & Discard patch within 12 hours or as directed by MD   LINACLOTIDE (LINZESS) 145 MCG CAPS CAPSULE    Take 145 mcg by mouth daily before breakfast.    LORATADINE (CLARITIN) 10 MG TABLET    Take 10 mg by mouth daily before breakfast.    MULTIPLE VITAMINS-MINERALS (CERTAGEN PO)    Take 1 tablet by mouth daily.    OMEPRAZOLE (PRILOSEC) 20 MG CAPSULE    Take 20 mg by mouth daily.   ONDANSETRON (ZOFRAN) 8 MG TABLET    Take 8 mg by mouth every 8 (eight) hours as needed for nausea or vomiting.   POLYETHYLENE GLYCOL (MIRALAX / GLYCOLAX) PACKET    Take 17 g by mouth daily at 6 (six) AM.    POTASSIUM CHLORIDE ER 20 MEQ TBCR    Take 40 mEq by mouth daily.   SENNA (SENOKOT) 8.6 MG TABS TABLET    Take 1 tablet (8.6 mg total) by mouth 2 (two) times daily.   SODIUM CHLORIDE (OCEAN) 0.65 % SOLN NASAL SPRAY    Place 1 spray into both nostrils as directed. Four times daily And every 24 hours as needed to moisturize nasal passages.   TORSEMIDE (DEMADEX) 20 MG TABLET    Take 20 mg by mouth daily.   TRAVOPROST, BAK FREE, (TRAVATAN) 0.004 % SOLN OPHTHALMIC SOLUTION    Place 1 drop into both eyes daily at 8 pm.    VITAMIN B-12 (CYANOCOBALAMIN) 500 MCG TABLET    Take 500 mcg by mouth every evening.   Modified Medications   No medications on file  Discontinued Medications   BIOTIN PO    Give 1 tablet by mouth one time a day for nutrition      SIGNIFICANT DIAGNOSTIC EXAMS  02-15-15: abdominal ultrasound: 1. Gallbladder not visualized. 2. No hydronephrosis no renal calculus; 3. The pancreas not well visualized; which could be related to gassy abdomen but an appearance which might represent pancreatitis in the appropriate clinical setting.   03-12-15: upper endoscopy: duodenal bulb 1.5-2.0 cm ulcer with clot at base. No active bleeding. Surrounding mucosa was edematous. Biopsy of the surrounding mucosa were done.   12-20-15: ct of abdomen and pelvis: 1. Loosely twisted sigmoid volvulus with transition point. Proximal distention is improved compared to radiography earlier today suggesting partial interval decompression. 2. Chronic findings are stable and described above.  12-20-15: flex sigmoidoscopy: chronic intermittent sigmoid volvulus. The site of volvulus was only slightly twisted during this exam, easily passed with colonoscope and the air filled dilated colon proximal to it was suctioned extensively. Her abdomen was flat, normal after the exam.   02-23-16: renal ultrasound: normal right kidney slightly small left kidney. Right 1.9 cm cyst no hydronephrosis or stones   09-05-16: right lower extremity doppler: DVT: neg   NO NEW EXAMS    LABS REVIEWED PREVIOUS     01-04-16: wbc 4.1; hgb 10.9; hct 32.4; mcv 100.5; plt 175; glucose 121; bun 13.2; creat 1.18; k+ 3.8; na++ 144; liver normal albumin 3.8 01-31-16; urine culture: e-coli: ESBL invanz 02-15-16: wbc 4.6; hgb 11.3; hct 36.3; mcv 102.7; plt 128; glucose 160; bun 15.3; creat 1.65;  k+ 4.5; na++ 143; liver normal albumin 3.9 02-19-16: glucose 116; bun 28.7; mcv 1.72; k+ 4.5; na++ 143 02-23-16; wbc 3.5 hgb 9.7; hct 30.4; mcv 101.8; plt 126; glucose 118; bun 25.6; creat 1.52; k+ 4.6; na++ 144  03-11-16: glucose 196; bun 18.4; creat 1.28; k+ 3.7; na++ 141  03-19-16: A/B neg for influenza 04-01-16: glucose 139; bun 15.7; creat 1.16; k+ 3.1; na++ 144 04-07-16: glucose 140; bun 18.2;  creat 1.37; k+ 4.1; na++ 143  04-17-16: wbc 5.0; hgb 11.1; hct 33.3; mcv 100.6; plt 119; glucose 178; bun 19; creat 1.22; k+ 3.7; na++ 141; liver normal albumin 3.8 04-18-16: wbc 4.0; hgb 9.3; hct 28.8; mcv 101.1; plt 93; glucose 124; bun 17; creat 1.20; k+ 2.9; na++ 141; liver normal albumin 3.2; mag 1.7 phos 3.7; tsh 2.572; hgb a1c 6.9 04-20-16: wbc 5.5; hgb 10.2; hct 30.9; mcv 100.0; plt 97; glucose 103; bun 14; create 1.00; k+ 3.7; na++ 143; mag 1.8  04-24-16:glucose 132; bun 17; creat 1.05; k+ 4.3; na++ 138  08-12-16: wb 4.0; hgb 10.7; hct 32.1 ;mcv 103.2; plt 95; glucose 111 bun 33.0; creat 1.57; k+ 4.4 ;na++ 143; liver normal albumin 3.5  11-24-16: wbc 3.4; hgb 9.9; hct 29.7; mcv 106; plt 98; glucose 122; bun 17.4; creat 1.22; k+ 3.8; na++ 144; ca 8.8; chol 133; ldl 78; trig 75; hdl 41   TODAY:  01-30-17: wbc 4.4; hgb 9.9; hct 28.7; mcv 102.5; plt 91; glucose glucose 232; bun 25; creat 1.50; k+ 3.8; na++ 139; ca 8.7    Review of Systems  Constitutional: Negative for malaise/fatigue.  HENT:       Does have headaches that the vicodin relieves   Respiratory: Negative for cough and shortness of breath.   Cardiovascular: Positive for leg swelling. Negative for chest pain and palpitations.       To right leg   Gastrointestinal: Negative for abdominal pain, constipation and heartburn.  Musculoskeletal: Negative for back pain, joint pain and myalgias.  Skin: Negative.   Neurological: Negative for dizziness.  Psychiatric/Behavioral: The patient is not nervous/anxious.     Physical Exam  Constitutional: No distress.  Eyes: Conjunctivae are normal.  Neck: Neck supple. No JVD present. No thyromegaly present.  Cardiovascular: Normal rate, regular rhythm and intact distal pulses.   Respiratory: Effort normal and breath sounds normal. No respiratory distress. She has no wheezes.  GI: Soft. Bowel sounds are normal. She exhibits distension (abdomen is slightly distended; ). There is no tenderness.   Musculoskeletal: She exhibits no edema.  Able to move all extremities   Lymphadenopathy:    She has no cervical adenopathy.  Neurological: She is alert.  Is slightly confused   Skin: Skin is warm and dry. She is not diaphoretic.  Psychiatric: She has a normal mood and affect.      ASSESSMENT/ PLAN:  TODAY:   1. Chronic headaches: is stable: will continue 1/2 vicodin 5/325 mg tabs daily and every 8 hours as needed and will monitor   2. Hypokalemia: k+ 3.8; her son tells me that the he had been told in the past by doctors that her k+ needs to be at least 4.0 if less will cause her to be confused: will give her 20 meq now and will change her k+ to 40 meq in the AM and 20 meq in the afternoon.   Will repeat k+ in one week.    MD is aware of resident's narcotic use and is in agreement with current plan of care.  We will attempt to wean resident as apropriate     Ok Edwards NP [facility]  Contact [phone] Monday through Friday 8am- 5pm  After hours call [phone]

## 2017-02-07 LAB — BASIC METABOLIC PANEL: Potassium: 4.6 (ref 3.4–5.3)

## 2017-02-13 ENCOUNTER — Encounter: Payer: Self-pay | Admitting: Adult Health

## 2017-02-13 ENCOUNTER — Non-Acute Institutional Stay (SKILLED_NURSING_FACILITY): Payer: Medicare Other | Admitting: Adult Health

## 2017-02-13 DIAGNOSIS — N183 Chronic kidney disease, stage 3 unspecified: Secondary | ICD-10-CM

## 2017-02-13 DIAGNOSIS — K562 Volvulus: Secondary | ICD-10-CM | POA: Diagnosis not present

## 2017-02-13 DIAGNOSIS — E876 Hypokalemia: Secondary | ICD-10-CM

## 2017-02-13 DIAGNOSIS — I482 Chronic atrial fibrillation, unspecified: Secondary | ICD-10-CM

## 2017-02-13 NOTE — Progress Notes (Signed)
Location:   [facility] Room Number: 631 A Place of Service:  SNF (31)   CODE STATUS: DNR  Allergies  Allergen Reactions  . Aspirin Other (See Comments)    [name] only    Chief Complaint  Patient presents with  . Acute Visit    Care plan meeting    HPI:  We have met for her quarterly car plan meeting. Present is the care plan team; myself ; Aven; and her children. She is clinching her teeth most of time; her family is wondering if the teeth clinching could be a cause of headaches. She is eating softer foods as those are easily to get down. She does have jaw pain as well. She does have false teeth; will have her to see the dentist. Her family is happy with her k+ level since it is now in the 4 range. There were nursing concerns that were dealt with as well. At this time there are no nursing concerns.   Past Medical History:  Diagnosis Date  . Altered mental status   . Anemia   . Atrial fibrillation ([place])   . CKD (chronic kidney disease) stage 3, GFR 30-59 ml/min 02/27/2015  . Constipation 02/27/2015  . Coronary artery disease   . DEMENTIA   . Depression   . Diabetes mellitus   . Edema of lower extremity 07/13/11   right leg more swollen than left leg  . Encephalopathy   . Enlarged heart   . Esophageal dysmotility 07/02/12  . GERD (gastroesophageal reflux disease)   . History of adenomatous polyp of colon 06/24/99  . Horseshoe kidney   . Hyperlipemia   . Hypertension   . Pancreatic lesion 05/22/11   no further workup  per PCP/family due to age  . Parkinson disease ([place])   . Peripheral neuropathy   . Sigmoid volvulus ([place]) 05/24/2013  . Thrombophlebitis   . TIA (transient ischemic attack) 12/19/2012    Past Surgical History:  Procedure Laterality Date  . ABDOMINAL HYSTERECTOMY    . BOWEL DECOMPRESSION N/A 04/18/2016   Procedure: BOWEL DECOMPRESSION;  Surgeon: [name], MD;  [facility]: [facility];  Service: Endoscopy;  Laterality: N/A;  . BREAST  SURGERY     2 benign tumors removed left breast  . CHOLECYSTECTOMY    . ESOPHAGEAL DILATION     several times by Dr. [name]  . ESOPHAGOGASTRODUODENOSCOPY (EGD) WITH ESOPHAGEAL DILATION N/A 08/01/2012   Procedure: ESOPHAGOGASTRODUODENOSCOPY (EGD) WITH ESOPHAGEAL DILATION;  Surgeon: [name], MD;  [facility];  Service: Endoscopy;  Laterality: N/A;  with c-arm savory dilators  . ESOPHAGOGASTRODUODENOSCOPY (EGD) WITH PROPOFOL N/A 03/12/2015   Procedure: ESOPHAGOGASTRODUODENOSCOPY (EGD) WITH PROPOFOL;  Surgeon: [name], MD;  [facility];  Service: Endoscopy;  Laterality: N/A;  . FLEXIBLE SIGMOIDOSCOPY N/A 05/24/2013   Procedure: FLEXIBLE SIGMOIDOSCOPY;  Surgeon: [name], MD;  [facility];  Service: Endoscopy;  Laterality: N/A;  . FLEXIBLE SIGMOIDOSCOPY N/A 05/26/2013   Procedure:  flex with decompression of sigmoid volvulus;  Surgeon: [name], MD;  [facility];  Service: Endoscopy;  Laterality: N/A;  . FLEXIBLE SIGMOIDOSCOPY N/A 12/20/2015   Procedure: FLEXIBLE SIGMOIDOSCOPY;  Surgeon: Milus Banister, MD;  [facility];  Service: Endoscopy;  Laterality: N/A;  . FLEXIBLE SIGMOIDOSCOPY N/A 04/17/2016   Procedure: FLEXIBLE SIGMOIDOSCOPY;  Surgeon: Ladene Artist, MD;  [facility];  Service: Endoscopy;  Laterality: N/A;  . FLEXIBLE SIGMOIDOSCOPY N/A 04/18/2016   Procedure:  FLEXIBLE SIGMOIDOSCOPY;  Surgeon: [name], MD;  Location: [facility];  Service: Endoscopy;  Laterality: N/A;  . FOOT SURGERY     benign tumors from foot  . NOSE SURGERY    . SHOULDER SURGERY  2001   left clavicle excision and acromioplasty  . TOTAL HIP ARTHROPLASTY      Social History   Social History  . Marital status: Widowed    Spouse name: N/A  . Number of children: 5  . Years of education: 10th   Occupational History  . Retired    Social History Main Topics  . Smoking status: Never Smoker  . Smokeless tobacco:  Never Used  . Alcohol use No  . Drug use: No  . Sexual activity: No   Other Topics Concern  . Not on file   Social History Narrative   Pt lives at [place]    Caffeine Use: very small amount daily   Family History  Problem Relation Age of Onset  . Cancer Unknown   . Heart disease Unknown       VITAL SIGNS BP 128/80   Ht 5' 7"  (1.702 m)   Wt 153 lb 9.6 oz (69.7 kg)   BMI 24.06 kg/m   Patient's Medications  New Prescriptions   No medications on file  Previous Medications   AMLODIPINE (NORVASC) 2.5 MG TABLET    Take 2.5 mg by mouth every morning. Hold for BP < 100/60 or HR < 60   ASPIRIN EC 81 MG TABLET    Take 81 mg by mouth daily.   BACLOFEN (LIORESAL) 10 MG TABLET    Take 5 mg by mouth at bedtime.   BENZOCAINE-MENTHOL (CHLORASEPTIC) 6-10 MG LOZENGE    Take 1 lozenge by mouth as needed for sore throat.   CARVEDILOL (COREG) 3.125 MG TABLET    Take 3.125 mg by mouth daily.   CHOLECALCIFEROL (VITAMIN D) 1000 UNITS TABLET    Take 1,000 Units by mouth every evening.    CRANBERRY 475 MG CAPS    Take 1 capsule by mouth 2 (two) times daily.    DOCUSATE SODIUM (COLACE) 100 MG CAPSULE    Take 200 mg by mouth at bedtime.    DORZOLAMIDE (TRUSOPT) 2 % OPHTHALMIC SOLUTION    Place 1 drop into both eyes 3 (three) times daily.   ESCITALOPRAM (LEXAPRO) 10 MG TABLET    Take 10 mg by mouth daily.   FAMOTIDINE (PEPCID) 20 MG TABLET    Take 20 mg by mouth at bedtime.   FERROUS SULFATE 220 (44 FE) MG/5ML SOLUTION    Give 5 ml by mouth in the evening for supplement   FLUTICASONE (FLONASE) 50 MCG/ACT NASAL SPRAY    Place 2 sprays into both nostrils at bedtime.   GABAPENTIN (NEURONTIN) 100 MG CAPSULE    Take 100 mg by mouth at bedtime.   HYDROCODONE-ACETAMINOPHEN (NORCO/VICODIN) 5-325 MG TABLET    Take 0.5 tablet by mouth every morning for pain.  Hold for sedation.    May take 0.5 tablet by mouth every 8 hours as needed for moderate to severe pain   IPRATROPIUM-ALBUTEROL (DUONEB) 0.5-2.5 (3)  MG/3ML SOLN    Take 3 mLs by nebulization daily as needed.   LIDOCAINE (LIDODERM) 5 %    Place 1 patch onto the skin daily. Apply 1 patch to left lower back/hip every morning.  Remove & Discard patch within 12 hours or as directed by MD   LINACLOTIDE (LINZESS) [place]  Take 145 mcg by mouth daily before breakfast.    LORATADINE (CLARITIN) 10 MG TABLET    Take 10 mg by mouth daily before breakfast.    MULTIPLE VITAMINS-MINERALS (CERTAGEN PO)    Take 1 tablet by mouth daily.    OMEPRAZOLE (PRILOSEC) 20 MG CAPSULE    Take 20 mg by mouth daily.   ONDANSETRON (ZOFRAN) 8 MG TABLET    Take 8 mg by mouth every 8 (eight) hours as needed for nausea or vomiting.   PHENAZOPYRIDINE HCL (AZO TABS PO)    Take 1 tablet by mouth at bedtime.   POLYETHYLENE GLYCOL (MIRALAX / GLYCOLAX) PACKET    Take 17 g by mouth daily at 6 (six) AM.    POTASSIUM CHLORIDE ER 20 MEQ TBCR    Give 40 meq by mouth every morning and 20 meq by mouth in the afternoon   SENNA (SENOKOT) 8.6 MG TABS TABLET    Take 1 tablet (8.6 mg total) by mouth 2 (two) times daily.   SODIUM CHLORIDE (OCEAN) 0.65 % SOLN NASAL SPRAY    Place 1 spray into both nostrils as directed. Four times daily And every 24 hours as needed to moisturize nasal passages.   TORSEMIDE (DEMADEX) 20 MG TABLET    Take 20 mg by mouth daily.   TRAVOPROST, BAK FREE, (TRAVATAN) 0.004 % SOLN OPHTHALMIC SOLUTION    Place 1 drop into both eyes daily at 8 pm.    VITAMIN B-12 (CYANOCOBALAMIN) 500 MCG TABLET    Take 500 mcg by mouth every evening.   Modified Medications   No medications on file  Discontinued Medications   No medications on file     SIGNIFICANT DIAGNOSTIC EXAMS  PREVIOUS  02-15-15: abdominal ultrasound: 1. Gallbladder not visualized. 2. No hydronephrosis no renal calculus; 3. The pancreas not well visualized; which could be related to gassy abdomen but an appearance which might represent pancreatitis in the appropriate clinical setting.   03-12-15:  upper endoscopy: duodenal bulb 1.5-2.0 cm ulcer with clot at base. No active bleeding. Surrounding mucosa was edematous. Biopsy of the surrounding mucosa were done.   12-20-15: ct of abdomen and pelvis: 1. Loosely twisted sigmoid volvulus with transition point. Proximal distention is improved compared to radiography earlier today suggesting partial interval decompression. 2. Chronic findings are stable and described above.  12-20-15: flex sigmoidoscopy: chronic intermittent sigmoid volvulus. The site of volvulus was only slightly twisted during this exam, easily passed with colonoscope and the air filled dilated colon proximal to it was suctioned extensively. Her abdomen was flat, normal after the exam.   02-23-16: renal ultrasound: normal right kidney slightly small left kidney. Right 1.9 cm cyst no hydronephrosis or stones   09-05-16: right lower extremity doppler: DVT: neg   NO NEW EXAMS    LABS REVIEWED PREVIOUS     02-15-16: wbc 4.6; hgb 11.3; hct 36.3; mcv 102.7; plt 128; glucose 160; bun 15.3; creat 1.65; k+ 4.5; na++ 143; liver normal albumin 3.9 02-19-16: glucose 116; bun 28.7; mcv 1.72; k+ 4.5; na++ 143 02-23-16; wbc 3.5 hgb 9.7; hct 30.4; mcv 101.8; plt 126; glucose 118; bun 25.6; creat 1.52; k+ 4.6; na++ 144  03-11-16: glucose 196; bun 18.4; creat 1.28; k+ 3.7; na++ 141  03-19-16: A/B neg for influenza 04-01-16: glucose 139; bun 15.7; creat 1.16; k+ 3.1; na++ 144 04-07-16: glucose 140; bun 18.2; creat 1.37; k+ 4.1; na++ 143  04-17-16: wbc 5.0; hgb 11.1; hct 33.3; mcv 100.6; plt 119; glucose 178; bun 19; creat  1.22; k+ 3.7; na++ 141; liver normal albumin 3.8 04-18-16: wbc 4.0; hgb 9.3; hct 28.8; mcv 101.1; plt 93; glucose 124; bun 17; creat 1.20; k+ 2.9; na++ 141; liver normal albumin 3.2; mag 1.7 phos 3.7; tsh 2.572; hgb a1c 6.9 04-20-16: wbc 5.5; hgb 10.2; hct 30.9; mcv 100.0; plt 97; glucose 103; bun 14; create 1.00; k+ 3.7; na++ 143; mag 1.8  04-24-16:glucose 132; bun 17; creat 1.05;  k+ 4.3; na++ 138  08-12-16: wb 4.0; hgb 10.7; hct 32.1 ;mcv 103.2; plt 95; glucose 111 bun 33.0; creat 1.57; k+ 4.4 ;na++ 143; liver normal albumin 3.5  11-24-16: wbc 3.4; hgb 9.9; hct 29.7; mcv 106; plt 98; glucose 122; bun 17.4; creat 1.22; k+ 3.8; na++ 144; ca 8.8; chol 133; ldl 78; trig 75; hdl 41  01-30-17: wbc 4.4; hgb 9.9; hct 28.7; mcv 102.5; plt 91; glucose glucose 232; bun 25; creat 1.50; k+ 3.8; na++ 139; ca 8.7  TODAY:  02-07-17: K+ 4.6     Review of Systems  Constitutional: Negative for malaise/fatigue.  Respiratory: Negative for cough and shortness of breath.   Cardiovascular: Negative for chest pain, palpitations and leg swelling.  Gastrointestinal: Negative for abdominal pain, constipation and heartburn.  Musculoskeletal: Negative for back pain, joint pain and myalgias.  Skin: Negative.   Neurological: Positive for headaches. Negative for dizziness.       Has occasional headaches   Psychiatric/Behavioral: The patient is not nervous/anxious.    Physical Exam  Constitutional: No distress.  Frail   Eyes: Conjunctivae are normal.  Neck: Neck supple. No JVD present. No thyromegaly present.  Cardiovascular: Normal rate, regular rhythm and intact distal pulses.   Respiratory: Effort normal and breath sounds normal. No respiratory distress. She has no wheezes.  GI: Soft. Bowel sounds are normal. She exhibits distension. There is no tenderness.  Is sightly distended non tender   Musculoskeletal: She exhibits no edema.  Able to move all extremities   Lymphadenopathy:    She has no cervical adenopathy.  Neurological: She is alert.  Skin: Skin is warm and dry. She is not diaphoretic.  Psychiatric: She has a normal mood and affect.      ASSESSMENT/ PLAN:  TODAY:   1. Chronic kidney disease stage III 2. afib 3. Hypokalemia 4. Volvulus of sigmoid colon  Will have her see the dentist Will continue her current plan of care Will continue to monitor her status.   Time spent  with patient, family and care plan team: 40 minutes: education involving electrolyte balance; headaches; facility policies; the family did verbalize understanding     MD is aware of resident's narcotic use and is in agreement with current plan of care. We will attempt to wean resident as apropriate     Ok Edwards NP [facility]  Contact [phone] Monday through Friday 8am- 5pm  After hours call [phone]

## 2017-02-20 ENCOUNTER — Encounter: Payer: Self-pay | Admitting: Adult Health

## 2017-02-20 ENCOUNTER — Non-Acute Institutional Stay (SKILLED_NURSING_FACILITY): Payer: Medicare Other | Admitting: Adult Health

## 2017-02-20 DIAGNOSIS — R634 Abnormal weight loss: Secondary | ICD-10-CM

## 2017-02-20 DIAGNOSIS — M26609 Unspecified temporomandibular joint disorder, unspecified side: Secondary | ICD-10-CM | POA: Diagnosis not present

## 2017-02-20 DIAGNOSIS — R627 Adult failure to thrive: Secondary | ICD-10-CM | POA: Diagnosis not present

## 2017-02-20 NOTE — Progress Notes (Signed)
Location:   [facility] Room Number: 638 A Place of Service:  SNF (31)   CODE STATUS: DNR  Allergies  Allergen Reactions  . Aspirin Other (See Comments)    [name] only    Chief Complaint  Patient presents with  . Acute Visit    weight loss    HPI:  She has been slowing losing weight from June 162 pounds to her current weight of 146 pounds. She does have TMJ  Which is making it difficult for her to chew she is working with therapy. She is eating softer foods. She does tell me that she does have a hard time chewing foods at times. She does not feel as though she has lost much weight. She does tell me that she does get hungry.    Past Medical History:  Diagnosis Date  . Altered mental status   . Anemia   . Atrial fibrillation ([place])   . CKD (chronic kidney disease) stage 3, GFR 30-59 ml/min 02/27/2015  . Constipation 02/27/2015  . Coronary artery disease   . DEMENTIA   . Depression   . Diabetes mellitus   . Edema of lower extremity 07/13/11   right leg more swollen than left leg  . Encephalopathy   . Enlarged heart   . Esophageal dysmotility 07/02/12  . GERD (gastroesophageal reflux disease)   . History of adenomatous polyp of colon 06/24/99  . Horseshoe kidney   . Hyperlipemia   . Hypertension   . Pancreatic lesion 05/22/11   no further workup  per PCP/family due to age  . Parkinson disease ([place])   . Peripheral neuropathy   . Sigmoid volvulus ([place]) 05/24/2013  . Thrombophlebitis   . TIA (transient ischemic attack) 12/19/2012    Past Surgical History:  Procedure Laterality Date  . ABDOMINAL HYSTERECTOMY    . BOWEL DECOMPRESSION N/A 04/18/2016   Procedure: BOWEL DECOMPRESSION;  Surgeon: [name], MD;  [facility];  Service: Endoscopy;  Laterality: N/A;  . BREAST SURGERY     2 benign tumors removed left breast  . CHOLECYSTECTOMY    . ESOPHAGEAL DILATION     several times by Dr. [name]  . ESOPHAGOGASTRODUODENOSCOPY (EGD) WITH  ESOPHAGEAL DILATION N/A 08/01/2012   Procedure: ESOPHAGOGASTRODUODENOSCOPY (EGD) WITH ESOPHAGEAL DILATION;  Surgeon: [name], MD;  [facility];  Service: Endoscopy;  Laterality: N/A;  with c-arm savory dilators  . ESOPHAGOGASTRODUODENOSCOPY (EGD) WITH PROPOFOL N/A 03/12/2015   Procedure: ESOPHAGOGASTRODUODENOSCOPY (EGD) WITH PROPOFOL;  Surgeon: [name], MD;  [facility];  Service: Endoscopy;  Laterality: N/A;  . FLEXIBLE SIGMOIDOSCOPY N/A 05/24/2013   Procedure: FLEXIBLE SIGMOIDOSCOPY;  Surgeon: [name], MD;  [facility];  Service: Endoscopy;  Laterality: N/A;  . FLEXIBLE SIGMOIDOSCOPY N/A 05/26/2013   Procedure:  flex with decompression of sigmoid volvulus;  Surgeon: [name], MD;  [facility];  Service: Endoscopy;  Laterality: N/A;  . FLEXIBLE SIGMOIDOSCOPY N/A 12/20/2015   Procedure: FLEXIBLE SIGMOIDOSCOPY;  Surgeon: Milus Banister, MD;  [facility];  Service: Endoscopy;  Laterality: N/A;  . FLEXIBLE SIGMOIDOSCOPY N/A 04/17/2016   Procedure: FLEXIBLE SIGMOIDOSCOPY;  Surgeon: Ladene Artist, MD;  [facility];  Service: Endoscopy;  Laterality: N/A;  . FLEXIBLE SIGMOIDOSCOPY N/A 04/18/2016   Procedure: FLEXIBLE SIGMOIDOSCOPY;  Surgeon: [name], MD;  [facility];  Service: Endoscopy;  Laterality: N/A;  . FOOT SURGERY     benign tumors from foot  .  NOSE SURGERY    . SHOULDER SURGERY  2001   left clavicle excision and acromioplasty  . TOTAL HIP ARTHROPLASTY      Social History   Social History  . Marital status: Widowed    Spouse name: N/A  . Number of children: 5  . Years of education: 10th   Occupational History  . Retired    Social History Main Topics  . Smoking status: Never Smoker  . Smokeless tobacco: Never Used  . Alcohol use No  . Drug use: No  . Sexual activity: No   Other Topics Concern  . Not on file   Social History Narrative   Pt lives at [place]     Caffeine Use: very small amount daily   Family History  Problem Relation Age of Onset  . Cancer Unknown   . Heart disease Unknown       VITAL SIGNS BP 122/78   Pulse 68   Temp 97.6 F (36.4 C)   Resp 16   Ht 5\' 7"  (1.702 m)   Wt 146 lb 6 oz (66.4 kg)   SpO2 97%   BMI 22.93 kg/m   Patient's Medications  New Prescriptions   No medications on file  Previous Medications   AMLODIPINE (NORVASC) 2.5 MG TABLET    Take 2.5 mg by mouth every morning. Hold for BP < 100/60 or HR < 60   ASPIRIN EC 81 MG TABLET    Take 81 mg by mouth daily.   BACLOFEN (LIORESAL) 10 MG TABLET    Take 5 mg by mouth at bedtime.   BENZOCAINE-MENTHOL (CHLORASEPTIC) 6-10 MG LOZENGE    Take 1 lozenge by mouth as needed for sore throat.   CARVEDILOL (COREG) 3.125 MG TABLET    Take 3.125 mg by mouth daily.   CHOLECALCIFEROL (VITAMIN D) 1000 UNITS TABLET    Take 1,000 Units by mouth every evening.    CRANBERRY 475 MG CAPS    Take 1 capsule by mouth 2 (two) times daily.    DOCUSATE SODIUM (COLACE) 100 MG CAPSULE    Take 200 mg by mouth at bedtime.    DORZOLAMIDE (TRUSOPT) 2 % OPHTHALMIC SOLUTION    Place 1 drop into both eyes 3 (three) times daily.   ESCITALOPRAM (LEXAPRO) 10 MG TABLET    Take 10 mg by mouth daily.   FAMOTIDINE (PEPCID) 20 MG TABLET    Take 20 mg by mouth at bedtime.   FERROUS SULFATE 220 (44 FE) MG/5ML SOLUTION    Give 5 ml by mouth in the evening for supplement   FLUTICASONE (FLONASE) 50 MCG/ACT NASAL SPRAY    Place 2 sprays into both nostrils at bedtime.   GABAPENTIN (NEURONTIN) 100 MG CAPSULE    Take 100 mg by mouth at bedtime.   HYDROCODONE-ACETAMINOPHEN (NORCO/VICODIN) 5-325 MG TABLET    Take 0.5 tablet by mouth every morning for pain.  Hold for sedation.    May take 0.5 tablet by mouth every 8 hours as needed for moderate to severe pain   IPRATROPIUM-ALBUTEROL (DUONEB) 0.5-2.5 (3) MG/3ML SOLN    Take 3 mLs by nebulization daily as needed.   LIDOCAINE (LIDODERM) 5 %    Place 1 patch onto the  skin daily. Apply 1 patch to left lower back/hip every morning.  Remove & Discard patch within 12 hours or as directed by MD   LINACLOTIDE (LINZESS) 145 MCG CAPS CAPSULE    Take 145 mcg by mouth daily before breakfast.  LORATADINE (CLARITIN) 10 MG TABLET    Take 10 mg by mouth daily before breakfast.    MULTIPLE VITAMINS-MINERALS (CERTAGEN PO)    Take 1 tablet by mouth daily.    OMEPRAZOLE (PRILOSEC) 20 MG CAPSULE    Take 20 mg by mouth daily.   ONDANSETRON (ZOFRAN) 8 MG TABLET    Take 8 mg by mouth every 8 (eight) hours as needed for nausea or vomiting.   PHENAZOPYRIDINE HCL (AZO TABS PO)    Take 1 tablet by mouth at bedtime.   POLYETHYLENE GLYCOL (MIRALAX / GLYCOLAX) PACKET    Take 17 g by mouth daily at 6 (six) AM.    POTASSIUM CHLORIDE ER 20 MEQ TBCR    Give 40 meq by mouth every morning and 20 meq by mouth in the afternoon   SENNA (SENOKOT) 8.6 MG TABS TABLET    Take 1 tablet (8.6 mg total) by mouth 2 (two) times daily.   SODIUM CHLORIDE (OCEAN) 0.65 % SOLN NASAL SPRAY    Place 1 spray into both nostrils as directed. Four times daily And every 24 hours as needed to moisturize nasal passages.   TORSEMIDE (DEMADEX) 20 MG TABLET    Take 20 mg by mouth daily.   TRAVOPROST, BAK FREE, (TRAVATAN) 0.004 % SOLN OPHTHALMIC SOLUTION    Place 1 drop into both eyes daily at 8 pm.    VITAMIN B-12 (CYANOCOBALAMIN) 500 MCG TABLET    Take 500 mcg by mouth every evening.   Modified Medications   No medications on file  Discontinued Medications   No medications on file     SIGNIFICANT DIAGNOSTIC EXAMS  PREVIOUS  02-15-15: abdominal ultrasound: 1. Gallbladder not visualized. 2. No hydronephrosis no renal calculus; 3. The pancreas not well visualized; which could be related to gassy abdomen but an appearance which might represent pancreatitis in the appropriate clinical setting.   03-12-15: upper endoscopy: duodenal bulb 1.5-2.0 cm ulcer with clot at base. No active bleeding. Surrounding mucosa was  edematous. Biopsy of the surrounding mucosa were done.   12-20-15: ct of abdomen and pelvis: 1. Loosely twisted sigmoid volvulus with transition point. Proximal distention is improved compared to radiography earlier today suggesting partial interval decompression. 2. Chronic findings are stable and described above.  12-20-15: flex sigmoidoscopy: chronic intermittent sigmoid volvulus. The site of volvulus was only slightly twisted during this exam, easily passed with colonoscope and the air filled dilated colon proximal to it was suctioned extensively. Her abdomen was flat, normal after the exam.   02-23-16: renal ultrasound: normal right kidney slightly small left kidney. Right 1.9 cm cyst no hydronephrosis or stones   09-05-16: right lower extremity doppler: DVT: neg   NO NEW EXAMS    LABS REVIEWED PREVIOUS     02-15-16: wbc 4.6; hgb 11.3; hct 36.3; mcv 102.7; plt 128; glucose 160; bun 15.3; creat 1.65; k+ 4.5; na++ 143; liver normal albumin 3.9 02-19-16: glucose 116; bun 28.7; mcv 1.72; k+ 4.5; na++ 143 02-23-16; wbc 3.5 hgb 9.7; hct 30.4; mcv 101.8; plt 126; glucose 118; bun 25.6; creat 1.52; k+ 4.6; na++ 144  03-11-16: glucose 196; bun 18.4; creat 1.28; k+ 3.7; na++ 141  03-19-16: A/B neg for influenza 04-01-16: glucose 139; bun 15.7; creat 1.16; k+ 3.1; na++ 144 04-07-16: glucose 140; bun 18.2; creat 1.37; k+ 4.1; na++ 143  04-17-16: wbc 5.0; hgb 11.1; hct 33.3; mcv 100.6; plt 119; glucose 178; bun 19; creat 1.22; k+ 3.7; na++ 141; liver normal albumin 3.8 04-18-16: wbc  4.0; hgb 9.3; hct 28.8; mcv 101.1; plt 93; glucose 124; bun 17; creat 1.20; k+ 2.9; na++ 141; liver normal albumin 3.2; mag 1.7 phos 3.7; tsh 2.572; hgb a1c 6.9 04-20-16: wbc 5.5; hgb 10.2; hct 30.9; mcv 100.0; plt 97; glucose 103; bun 14; create 1.00; k+ 3.7; na++ 143; mag 1.8  04-24-16:glucose 132; bun 17; creat 1.05; k+ 4.3; na++ 138  08-12-16: wb 4.0; hgb 10.7; hct 32.1 ;mcv 103.2; plt 95; glucose 111 bun 33.0; creat 1.57; k+  4.4 ;na++ 143; liver normal albumin 3.5  11-24-16: wbc 3.4; hgb 9.9; hct 29.7; mcv 106; plt 98; glucose 122; bun 17.4; creat 1.22; k+ 3.8; na++ 144; ca 8.8; chol 133; ldl 78; trig 75; hdl 41  01-30-17: wbc 4.4; hgb 9.9; hct 28.7; mcv 102.5; plt 91; glucose glucose 232; bun 25; creat 1.50; k+ 3.8; na++ 139; ca 8.7 02-07-17: K+ 4.6  NO NEW LABS     Review of Systems  Constitutional: Negative for malaise/fatigue.  HENT:       Does have headaches;   Respiratory: Negative for cough and shortness of breath.   Cardiovascular: Negative for chest pain, palpitations and leg swelling.  Gastrointestinal: Negative for abdominal pain, constipation and heartburn.  Musculoskeletal: Negative for back pain, joint pain and myalgias.  Skin: Negative.   Neurological: Negative for dizziness.  Psychiatric/Behavioral: The patient is not nervous/anxious.     Physical Exam  Constitutional: No distress.  Frail   HENT:  Does have mild tmj tenderness   Eyes: Conjunctivae are normal.  Neck: Neck supple. No thyromegaly present.  Cardiovascular: Normal rate, regular rhythm, normal heart sounds and intact distal pulses.   Pulmonary/Chest: Effort normal and breath sounds normal. No respiratory distress.  Abdominal: Soft. Bowel sounds are normal. She exhibits distension. There is no tenderness.  Musculoskeletal: She exhibits no edema.  Is able to move all extremitites   Lymphadenopathy:    She has no cervical adenopathy.  Neurological: She is alert.  Skin: Skin is warm and dry. She is not diaphoretic.    ASSESSMENT/ PLAN:  TODAY:   1. TMJ 2. Weight loss 3. Failure to thrive:   Will begin med pass 60cc twice daily  Will check vit B 12; thiamine and folic acid Refer to TMJ specialist   MD is aware of resident's narcotic use and is in agreement with current plan of care. We will attempt to wean resident as apropriate     Ok Edwards NP [facility]  Contact [phone] Monday through  Friday 8am- 5pm  After hours call [phone]

## 2017-02-21 LAB — VITAMIN D 25 HYDROXY (VIT D DEFICIENCY, FRACTURES): VIT D 25 HYDROXY: 45.39

## 2017-02-21 LAB — VITAMIN B12: Vitamin B-12: 685

## 2017-03-02 DIAGNOSIS — M26609 Unspecified temporomandibular joint disorder, unspecified side: Secondary | ICD-10-CM | POA: Insufficient documentation

## 2017-03-02 DIAGNOSIS — R634 Abnormal weight loss: Secondary | ICD-10-CM | POA: Insufficient documentation

## 2017-03-05 ENCOUNTER — Encounter: Payer: Self-pay | Admitting: Adult Health

## 2017-03-05 ENCOUNTER — Non-Acute Institutional Stay (SKILLED_NURSING_FACILITY): Payer: Medicare Other | Admitting: Adult Health

## 2017-03-05 DIAGNOSIS — R627 Adult failure to thrive: Secondary | ICD-10-CM

## 2017-03-05 DIAGNOSIS — R634 Abnormal weight loss: Secondary | ICD-10-CM

## 2017-03-05 NOTE — Progress Notes (Signed)
Location:   [facility] [facility] Room Number: 478 A Place of Service:  SNF (31)   CODE STATUS: DNR  Allergies  Allergen Reactions  . Aspirin Other (See Comments)    [name] only    Chief Complaint  Patient presents with  . Acute Visit    Weight Loss    HPI:  She continues to suffer with TMJ which is contributing to her weight loss. She is able to eat soft foods; her appetite is variable. She denies any loss of appetite; no complaint of pain present; no difficult is swallowing. There are no reports of choking present.   Past Medical History:  Diagnosis Date  . Altered mental status   . Anemia   . Atrial fibrillation ([place])   . CKD (chronic kidney disease) stage 3, GFR 30-59 ml/min (HCC) 02/27/2015  . Constipation 02/27/2015  . Coronary artery disease   . DEMENTIA   . Depression   . Diabetes mellitus   . Edema of lower extremity 07/13/11   right leg more swollen than left leg  . Encephalopathy   . Enlarged heart   . Esophageal dysmotility 07/02/12  . GERD (gastroesophageal reflux disease)   . History of adenomatous polyp of colon 06/24/99  . Horseshoe kidney   . Hyperlipemia   . Hypertension   . Pancreatic lesion 05/22/11   no further workup  per PCP/family due to age  . Parkinson disease ([place])   . Peripheral neuropathy   . Sigmoid volvulus ([place]) 05/24/2013  . Thrombophlebitis   . TIA (transient ischemic attack) 12/19/2012    Past Surgical History:  Procedure Laterality Date  . ABDOMINAL HYSTERECTOMY    . BOWEL DECOMPRESSION N/A 04/18/2016   Procedure: BOWEL DECOMPRESSION;  Surgeon: [name], MD;  Location: [facility];  Service: Endoscopy;  Laterality: N/A;  . BREAST SURGERY     2 benign tumors removed left breast  . CHOLECYSTECTOMY    . ESOPHAGEAL DILATION     several times by Dr. [name]  . ESOPHAGOGASTRODUODENOSCOPY (EGD) WITH ESOPHAGEAL DILATION N/A 08/01/2012   Procedure: ESOPHAGOGASTRODUODENOSCOPY (EGD) WITH ESOPHAGEAL DILATION;  Surgeon:  [name], MD;  Location: [facility];  Service: Endoscopy;  Laterality: N/A;  with c-arm savory dilators  . ESOPHAGOGASTRODUODENOSCOPY (EGD) WITH PROPOFOL N/A 03/12/2015   Procedure: ESOPHAGOGASTRODUODENOSCOPY (EGD) WITH PROPOFOL;  Surgeon: [name], MD;  Location: [facility];  Service: Endoscopy;  Laterality: N/A;  . FLEXIBLE SIGMOIDOSCOPY N/A 05/24/2013   Procedure: FLEXIBLE SIGMOIDOSCOPY;  Surgeon: [name], MD;  Location: [facility];  Service: Endoscopy;  Laterality: N/A;  . FLEXIBLE SIGMOIDOSCOPY N/A 05/26/2013   Procedure:  flex with decompression of sigmoid volvulus;  Surgeon: [name], MD;  Location: [facility];  Service: Endoscopy;  Laterality: N/A;  . FLEXIBLE SIGMOIDOSCOPY N/A 12/20/2015   Procedure: FLEXIBLE SIGMOIDOSCOPY;  Surgeon: Milus Banister, MD;  Location: [facility];  Service: Endoscopy;  Laterality: N/A;  . FLEXIBLE SIGMOIDOSCOPY N/A 04/17/2016   Procedure: FLEXIBLE SIGMOIDOSCOPY;  Surgeon: Ladene Artist, MD;  Location: [facility];  Service: Endoscopy;  Laterality: N/A;  . FLEXIBLE SIGMOIDOSCOPY N/A 04/18/2016   Procedure: FLEXIBLE SIGMOIDOSCOPY;  Surgeon: [name], MD;  Location: [facility];  Service: Endoscopy;  Laterality: N/A;  . FOOT SURGERY     benign tumors from foot  . NOSE SURGERY    . SHOULDER SURGERY  2001   left clavicle excision and acromioplasty  . TOTAL HIP ARTHROPLASTY      Social History  Social History  . Marital status: Widowed    Spouse name: N/A  . Number of children: 5  . Years of education: 10th   Occupational History  . Retired    Social History Main Topics  . Smoking status: Never Smoker  . Smokeless tobacco: Never Used  . Alcohol use No  . Drug use: No  . Sexual activity: No   Other Topics Concern  . Not on file   Social History Narrative   Pt lives at [place]    Caffeine Use: very small amount daily   Family History  Problem Relation Age of Onset  . Cancer Unknown   . Heart  disease Unknown       VITAL SIGNS BP 123/70   Pulse (!) 57   Temp (!) 97.4 F (36.3 C)   Resp 16   Ht 5\' 7"  (1.702 m)   Wt 147 lb 8 oz (66.9 kg)   SpO2 96%   BMI 23.10 kg/m   Patient's Medications  New Prescriptions   No medications on file  Previous Medications   AMLODIPINE (NORVASC) 2.5 MG TABLET    Take 2.5 mg by mouth every morning. Hold for BP < 100/60 or HR < 60   ASPIRIN EC 81 MG TABLET    Take 81 mg by mouth daily.   BACLOFEN (LIORESAL) 10 MG TABLET    Take 5 mg by mouth at bedtime.   BENZOCAINE-MENTHOL (CHLORASEPTIC) 6-10 MG LOZENGE    Take 1 lozenge by mouth as needed for sore throat.   CARVEDILOL (COREG) 3.125 MG TABLET    Take 3.125 mg by mouth daily.   CHOLECALCIFEROL (VITAMIN D) 1000 UNITS TABLET    Take 1,000 Units by mouth every evening.    CRANBERRY 475 MG CAPS    Take 1 capsule by mouth 2 (two) times daily.    DOCUSATE SODIUM (COLACE) 100 MG CAPSULE    Take 200 mg by mouth at bedtime.    DORZOLAMIDE (TRUSOPT) 2 % OPHTHALMIC SOLUTION    Place 1 drop into both eyes 3 (three) times daily.   ESCITALOPRAM (LEXAPRO) 10 MG TABLET    Take 10 mg by mouth daily.   FAMOTIDINE (PEPCID) 20 MG TABLET    Take 20 mg by mouth at bedtime.   FERROUS SULFATE 220 (44 FE) MG/5ML SOLUTION    Give 5 ml by mouth in the evening for supplement   FLUTICASONE (FLONASE) 50 MCG/ACT NASAL SPRAY    Place 2 sprays into both nostrils at bedtime.   GABAPENTIN (NEURONTIN) 100 MG CAPSULE    Take 100 mg by mouth at bedtime.   HYDROCODONE-ACETAMINOPHEN (NORCO/VICODIN) 5-325 MG TABLET    Take 0.5 tablet by mouth every morning for pain.  Hold for sedation.    May take 0.5 tablet by mouth every 8 hours as needed for moderate to severe pain   IPRATROPIUM-ALBUTEROL (DUONEB) 0.5-2.5 (3) MG/3ML SOLN    Take 3 mLs by nebulization daily as needed.   LIDOCAINE (LIDODERM) 5 %    Place 1 patch onto the skin daily. Apply 1 patch to left lower back/hip every morning.  Remove & Discard patch within 12 hours or as  directed by MD   LINACLOTIDE (LINZESS) 145 MCG CAPS CAPSULE    Take 145 mcg by mouth daily before breakfast.    LORATADINE (CLARITIN) 10 MG TABLET    Take 10 mg by mouth daily before breakfast.    MULTIPLE VITAMINS-MINERALS (CERTAGEN PO)    Take 1 tablet  by mouth daily.    OMEPRAZOLE (PRILOSEC) 20 MG CAPSULE    Take 20 mg by mouth daily.   ONDANSETRON (ZOFRAN) 8 MG TABLET    Take 8 mg by mouth every 8 (eight) hours as needed for nausea or vomiting.   PHENAZOPYRIDINE HCL (AZO TABS PO)    Take 1 tablet by mouth at bedtime.   POLYETHYLENE GLYCOL (MIRALAX / GLYCOLAX) PACKET    Take 17 g by mouth daily at 6 (six) AM.    POTASSIUM CHLORIDE ER 20 MEQ TBCR    Give 40 meq by mouth every morning and 20 meq by mouth in the afternoon   SENNA (SENOKOT) 8.6 MG TABS TABLET    Take 1 tablet (8.6 mg total) by mouth 2 (two) times daily.   SODIUM CHLORIDE (OCEAN) 0.65 % SOLN NASAL SPRAY    Place 1 spray into both nostrils as directed. Four times daily And every 24 hours as needed to moisturize nasal passages.   TORSEMIDE (DEMADEX) 20 MG TABLET    Take 20 mg by mouth daily.   TRAVOPROST, BAK FREE, (TRAVATAN) 0.004 % SOLN OPHTHALMIC SOLUTION    Place 1 drop into both eyes daily at 8 pm.    VITAMIN B-12 (CYANOCOBALAMIN) 500 MCG TABLET    Take 500 mcg by mouth every evening.   Modified Medications   No medications on file  Discontinued Medications   No medications on file     SIGNIFICANT DIAGNOSTIC EXAMS  PREVIOUS  02-15-15: abdominal ultrasound: 1. Gallbladder not visualized. 2. No hydronephrosis no renal calculus; 3. The pancreas not well visualized; which could be related to gassy abdomen but an appearance which might represent pancreatitis in the appropriate clinical setting.   03-12-15: upper endoscopy: duodenal bulb 1.5-2.0 cm ulcer with clot at base. No active bleeding. Surrounding mucosa was edematous. Biopsy of the surrounding mucosa were done.   12-20-15: ct of abdomen and pelvis: 1. Loosely twisted  sigmoid volvulus with transition point. Proximal distention is improved compared to radiography earlier today suggesting partial interval decompression. 2. Chronic findings are stable and described above.  12-20-15: flex sigmoidoscopy: chronic intermittent sigmoid volvulus. The site of volvulus was only slightly twisted during this exam, easily passed with colonoscope and the air filled dilated colon proximal to it was suctioned extensively. Her abdomen was flat, normal after the exam.   02-23-16: renal ultrasound: normal right kidney slightly small left kidney. Right 1.9 cm cyst no hydronephrosis or stones   09-05-16: right lower extremity doppler: DVT: neg   NO NEW EXAMS    LABS REVIEWED PREVIOUS     03-11-16: glucose 196; bun 18.4; creat 1.28; k+ 3.7; na++ 141  03-19-16: A/B neg for influenza 04-01-16: glucose 139; bun 15.7; creat 1.16; k+ 3.1; na++ 144 04-07-16: glucose 140; bun 18.2; creat 1.37; k+ 4.1; na++ 143  04-17-16: wbc 5.0; hgb 11.1; hct 33.3; mcv 100.6; plt 119; glucose 178; bun 19; creat 1.22; k+ 3.7; na++ 141; liver normal albumin 3.8 04-18-16: wbc 4.0; hgb 9.3; hct 28.8; mcv 101.1; plt 93; glucose 124; bun 17; creat 1.20; k+ 2.9; na++ 141; liver normal albumin 3.2; mag 1.7 phos 3.7; tsh 2.572; hgb a1c 6.9 04-20-16: wbc 5.5; hgb 10.2; hct 30.9; mcv 100.0; plt 97; glucose 103; bun 14; create 1.00; k+ 3.7; na++ 143; mag 1.8  04-24-16:glucose 132; bun 17; creat 1.05; k+ 4.3; na++ 138  08-12-16: wb 4.0; hgb 10.7; hct 32.1 ;mcv 103.2; plt 95; glucose 111 bun 33.0; creat 1.57; k+ 4.4 ;na++  143; liver normal albumin 3.5  11-24-16: wbc 3.4; hgb 9.9; hct 29.7; mcv 106; plt 98; glucose 122; bun 17.4; creat 1.22; k+ 3.8; na++ 144; ca 8.8; chol 133; ldl 78; trig 75; hdl 41  01-30-17: wbc 4.4; hgb 9.9; hct 28.7; mcv 102.5; plt 91; glucose glucose 232; bun 25; creat 1.50; k+ 3.8; na++ 139; ca 8.7 02-07-17: K+ 4.6  NO NEW LABS     Review of Systems  Constitutional: Negative for malaise/fatigue.         Poor appetite   Respiratory: Negative for cough and shortness of breath.   Cardiovascular: Negative for chest pain, palpitations and leg swelling.  Gastrointestinal: Negative for abdominal pain, constipation, heartburn and nausea.  Musculoskeletal: Negative for back pain, joint pain and myalgias.  Skin: Negative.   Neurological: Negative for dizziness.  Psychiatric/Behavioral: The patient is not nervous/anxious.    Physical Exam  Constitutional: No distress.  Frail   HENT:  Has mild tmj tenderness   Neck: Neck supple. No thyromegaly present.  Cardiovascular: Normal rate, regular rhythm, normal heart sounds and intact distal pulses.   Pulmonary/Chest: Effort normal and breath sounds normal. No respiratory distress.  Abdominal: Soft. Bowel sounds are normal. She exhibits distension. There is no tenderness.  Musculoskeletal: She exhibits no edema.  Is able to move all extremities   Lymphadenopathy:    She has no cervical adenopathy.  Neurological: She is alert.  Skin: Skin is warm and dry. She is not diaphoretic.  Psychiatric: She has a normal mood and affect.    ASSESSMENT/ PLAN:  TODAY:   1. Weight loss 2. Failure to thrive:   Will begin remeron 7.5 mg nightly for 14 days  Will stop duoneb and vit d     MD is aware of resident's narcotic use and is in agreement with current plan of care. We will attempt to wean resident as apropriate     Ok Edwards NP [facility]  Contact [phone] Monday through Friday 8am- 5pm  After hours call [phone]

## 2017-03-07 ENCOUNTER — Ambulatory Visit: Payer: Medicare Other | Admitting: Podiatry

## 2017-03-13 ENCOUNTER — Other Ambulatory Visit: Payer: Self-pay

## 2017-03-13 MED ORDER — HYDROCODONE-ACETAMINOPHEN 5-325 MG PO TABS: ORAL_TABLET | ORAL | 0 refills | Status: DC

## 2017-03-13 NOTE — Telephone Encounter (Signed)
RX faxed to [facility] @ [phone], phone number [phone] 

## 2017-03-14 ENCOUNTER — Encounter: Payer: Self-pay | Admitting: *Deleted

## 2017-03-15 ENCOUNTER — Encounter: Payer: Self-pay | Admitting: Internal Medicine

## 2017-03-15 ENCOUNTER — Non-Acute Institutional Stay (SKILLED_NURSING_FACILITY): Payer: Medicare Other | Admitting: Internal Medicine

## 2017-03-15 DIAGNOSIS — I5032 Chronic diastolic (congestive) heart failure: Secondary | ICD-10-CM

## 2017-03-15 DIAGNOSIS — J309 Allergic rhinitis, unspecified: Secondary | ICD-10-CM | POA: Diagnosis not present

## 2017-03-15 DIAGNOSIS — M150 Primary generalized (osteo)arthritis: Secondary | ICD-10-CM | POA: Diagnosis not present

## 2017-03-15 DIAGNOSIS — R627 Adult failure to thrive: Secondary | ICD-10-CM | POA: Diagnosis not present

## 2017-03-15 DIAGNOSIS — M159 Polyosteoarthritis, unspecified: Secondary | ICD-10-CM

## 2017-03-15 DIAGNOSIS — K562 Volvulus: Secondary | ICD-10-CM

## 2017-03-15 DIAGNOSIS — I482 Chronic atrial fibrillation, unspecified: Secondary | ICD-10-CM

## 2017-03-15 DIAGNOSIS — F325 Major depressive disorder, single episode, in full remission: Secondary | ICD-10-CM | POA: Diagnosis not present

## 2017-03-15 DIAGNOSIS — F0280 Dementia in other diseases classified elsewhere without behavioral disturbance: Secondary | ICD-10-CM | POA: Diagnosis not present

## 2017-03-15 NOTE — Progress Notes (Signed)
Patient ID: Maria Christian, female   DOB: May 01, 1925, 83 y.o.   MRN: [id number]     DATE:  March 15, 2017  Location:   [facility] Room Number: 034 A Place of Service: SNF (31)   Extended Emergency Contact Information Primary Emergency Contact: [name] [place] Phone: [phone] Relation: Son Secondary Emergency Contact: Roberts,Lynn Address: [address]          [place], [place] [address] [place] Phone: [phone] Mobile Phone: [phone] Relation: Daughter  Advanced Directive information Does Patient Have a Medical Advance Directive?: Yes, Type of Advance Directive: Out of facility DNR (pink MOST or yellow form), Pre-existing out of facility DNR order (yellow form or pink MOST form): Yellow form placed in chart (order not valid for inpatient use), Does patient want to make changes to medical advance directive?: No - Patient declined  Chief Complaint  Patient presents with  . Medical Management of Chronic Issues    1 month follow up    HPI:  83 yo female long term resident seen today for f/u. She c/o dry cough and CP that resolved spontaneously. She has sore throat. No f/c. No issues with bowel habits. No nursing issues. She is a poor historian due to dementia. Hx obtained from chart. RLE doppler done Apr 2018 neg for DVT  Anemia - stable on iron daily. Hgb 9.9  Chronic afib - rate controlled on coreg 3.125 mg daily; takes ASA for anticoagualtion     Chronic respiratory failure - O2 dependent; uses duoneb prn  Chronic pain syndrome 2/2 osteoarthritis - pain controlled on lidoderm to lower back daily;  baclofen 5 mg nightly for leg spasticity; vicodin 5/325 mg 1/2 tab every 6 hours as needed for pain   Depression - mood stable on lexapro 10 mg daily. She does benefit from this regimen  Chronic constipation with hx recurrent sigmoid volvulus - stable on colace 200 mg daily; linzess 145 mcg daily; senna twice  daily; miralax daily,. Ffamily has decided upon conservative treatment including repeat endoscopic decompression if needed.   Glaucoma - stable on trusopt to both eyes three times daily; travatan to both eyes nightly   CKD  - stage 3. Cr 1.22  Hypokalemia - stable on KCl ER 40 meq daily. K 3.8  Hypertension - BP stable on norvasc 2.5 mg daily; coreg 3.15 mg daily   Chronic diastolic heart failure - stable on demadex 20 mg daily with KCl daily. No recent exacerbations     Duodenal ulcer - stable on prilosec 20 mg daily; zofran 8 mg every 8 hours as needed; pepcid 20 mg nightly to help with GERD symptoms   Allergic rhinitis - suboptimally controlled. She uses flonase nightly and claritin 10 mg daily  Past Medical History:  Diagnosis Date  . Altered mental status   . Anemia   . Atrial fibrillation ([place])   . CKD (chronic kidney disease) stage 3, GFR 30-59 ml/min (HCC) 02/27/2015  . Constipation 02/27/2015  . Coronary artery disease   . DEMENTIA   . Depression   . Diabetes mellitus   . Edema of lower extremity 07/13/11   right leg more swollen than left leg  . Encephalopathy   . Enlarged heart   . Esophageal dysmotility 07/02/12  . GERD (gastroesophageal reflux disease)   . History of adenomatous polyp of colon 06/24/99  . Horseshoe kidney   . Hyperlipemia   . Hypertension   . Pancreatic lesion  05/22/11   no further workup  per PCP/family due to age  . Parkinson disease ([place])   . Peripheral neuropathy   . Sigmoid volvulus ([facility]) 05/24/2013  . Thrombophlebitis   . TIA (transient ischemic attack) 12/19/2012    Past Surgical History:  Procedure Laterality Date  . ABDOMINAL HYSTERECTOMY    . BOWEL DECOMPRESSION N/A 04/18/2016   Procedure: BOWEL DECOMPRESSION;  Surgeon: [name], MD;  Location: [facility];  Service: Endoscopy;  Laterality: N/A;  . BREAST SURGERY     2 benign tumors removed left breast  . CHOLECYSTECTOMY    . ESOPHAGEAL DILATION     several times by Dr. [name]  . ESOPHAGOGASTRODUODENOSCOPY (EGD) WITH ESOPHAGEAL DILATION N/A 08/01/2012   Procedure: ESOPHAGOGASTRODUODENOSCOPY (EGD) WITH ESOPHAGEAL DILATION;  Surgeon: [name], MD;  Location: [facility];  Service: Endoscopy;  Laterality: N/A;  with c-arm savory dilators  . ESOPHAGOGASTRODUODENOSCOPY (EGD) WITH PROPOFOL N/A 03/12/2015   Procedure: ESOPHAGOGASTRODUODENOSCOPY (EGD) WITH PROPOFOL;  Surgeon: [name], MD;  Location: [facility];  Service: Endoscopy;  Laterality: N/A;  . FLEXIBLE SIGMOIDOSCOPY N/A 05/24/2013   Procedure: FLEXIBLE SIGMOIDOSCOPY;  Surgeon: [name], MD;  Location: [facility];  Service: Endoscopy;  Laterality: N/A;  . FLEXIBLE SIGMOIDOSCOPY N/A 05/26/2013   Procedure:  flex with decompression of sigmoid volvulus;  Surgeon: [name], MD;  Location: [facility];  Service: Endoscopy;  Laterality: N/A;  . FLEXIBLE SIGMOIDOSCOPY N/A 12/20/2015   Procedure: FLEXIBLE SIGMOIDOSCOPY;  Surgeon: Milus Banister, MD;  Location: [facility];  Service: Endoscopy;  Laterality: N/A;  . FLEXIBLE SIGMOIDOSCOPY N/A 04/17/2016   Procedure: FLEXIBLE SIGMOIDOSCOPY;  Surgeon: Ladene Artist, MD;  Location: [facility];  Service: Endoscopy;  Laterality: N/A;  . FLEXIBLE SIGMOIDOSCOPY N/A 04/18/2016   Procedure: FLEXIBLE SIGMOIDOSCOPY;  Surgeon: [name], MD;  Location: [facility];  Service: Endoscopy;  Laterality: N/A;  . FOOT SURGERY     benign tumors from foot  . NOSE SURGERY    . SHOULDER SURGERY  2001   left clavicle excision and acromioplasty  . TOTAL HIP ARTHROPLASTY      Patient Care Team: [name], DO as PCP - General (Internal Medicine) Nyoka Cowden Phylis Bougie, NP as Nurse Practitioner (Nurse Practitioner)  Social History   Social History  . Marital status: Widowed    Spouse name: N/A  . Number of children: 5  . Years of education: 10th   Occupational History  . Retired    Social History Main Topics  . Smoking status: Never Smoker  .  Smokeless tobacco: Never Used  . Alcohol use No  . Drug use: No  . Sexual activity: No   Other Topics Concern  . Not on file   Social History Narrative   Pt lives at [facility]    Caffeine Use: very small amount daily     reports that she has never smoked. She has never used smokeless tobacco. She reports that she does not drink alcohol or use drugs.  Family History  Problem Relation Age of Onset  . Cancer Unknown   . Heart disease Unknown    Family Status  Relation Status  . Mother Deceased at age 64       blood clots  . Father Deceased at age 55       cirrohis of the liver  . Unknown (Not Specified)    Immunization History  Administered Date(s) Administered  . Influenza-Unspecified 07/13/2015, 03/09/2016  . PPD Test 07/28/2013,  02/18/2016, 02/25/2016    Allergies  Allergen Reactions  . Aspirin Other (See Comments)    G.I. Upset only    Medications: Patient's Medications  New Prescriptions   No medications on file  Previous Medications   AMLODIPINE (NORVASC) 2.5 MG TABLET    Take 2.5 mg by mouth every morning. Hold for BP < 100/60 or HR < 60   ASPIRIN EC 81 MG TABLET    Take 81 mg by mouth daily.   BACLOFEN (LIORESAL) 10 MG TABLET    Take 5 mg by mouth at bedtime.   BENZOCAINE-MENTHOL (CHLORASEPTIC) 6-10 MG LOZENGE    Take 1 lozenge by mouth as needed for sore throat.   CARVEDILOL (COREG) 3.125 MG TABLET    Take 3.125 mg by mouth daily.   CHOLECALCIFEROL (VITAMIN D) 1000 UNITS TABLET    Take 1,000 Units by mouth every evening.    CRANBERRY 475 MG CAPS    Take 1 capsule by mouth 2 (two) times daily.    DOCUSATE SODIUM (COLACE) 100 MG CAPSULE    Take 200 mg by mouth at bedtime.    DORZOLAMIDE (TRUSOPT) 2 % OPHTHALMIC SOLUTION    Place 1 drop into both eyes 3 (three) times daily.   ESCITALOPRAM (LEXAPRO) 10 MG TABLET    Take 10 mg by mouth daily.   FAMOTIDINE (PEPCID) 20 MG TABLET    Take 20 mg by mouth at bedtime.   FERROUS SULFATE 220 (44 FE) MG/5ML SOLUTION     Give 5 ml by mouth in the evening for supplement   FLUTICASONE (FLONASE) 50 MCG/ACT NASAL SPRAY    Place 2 sprays into both nostrils at bedtime.   GABAPENTIN (NEURONTIN) 100 MG CAPSULE    Take 100 mg by mouth at bedtime.   HYDROCODONE-ACETAMINOPHEN (NORCO/VICODIN) 5-325 MG TABLET    Take 0.5 tablet by mouth every morning for pain.  Hold for sedation.    May take 0.5 tablet by mouth every 8 hours as needed for moderate to severe pain   HYDROCODONE-ACETAMINOPHEN (NORCO/VICODIN) 5-325 MG TABLET    Give 0.5 mg tablet by mouth once daily for pain.   LIDOCAINE 4 % PTCH    Apply topically. Apply to Left hip topically at bedtime.  On for 12 hours then remove   LINACLOTIDE (LINZESS) 145 MCG CAPS CAPSULE    Take 145 mcg by mouth daily before breakfast.    LORATADINE (CLARITIN) 10 MG TABLET    Take 10 mg by mouth daily before breakfast.    MIRTAZAPINE (REMERON) 7.5 MG TABLET    Take 7.5 mg by mouth at bedtime.   MULTIPLE VITAMINS-MINERALS (CERTAGEN PO)    Take 1 tablet by mouth daily.    OMEPRAZOLE (PRILOSEC) 20 MG CAPSULE    Take 20 mg by mouth daily.   ONDANSETRON (ZOFRAN) 8 MG TABLET    Take 8 mg by mouth every 8 (eight) hours as needed for nausea or vomiting.   PHENAZOPYRIDINE HCL (AZO TABS PO)    Take 1 tablet by mouth at bedtime.   POLYETHYLENE GLYCOL (MIRALAX / GLYCOLAX) PACKET    Take 17 g by mouth daily at 6 (six) AM.    POTASSIUM CHLORIDE ER 20 MEQ TBCR    Give 40 meq by mouth every morning and 20 meq by mouth in the afternoon   SENNA (SENOKOT) 8.6 MG TABS TABLET    Take 1 tablet (8.6 mg total) by mouth 2 (two) times daily.   SODIUM CHLORIDE (OCEAN) 0.65 % SOLN  NASAL SPRAY    Place 1 spray into both nostrils as directed. Four times daily And every 24 hours as needed to moisturize nasal passages.   TORSEMIDE (DEMADEX) 20 MG TABLET    Take 20 mg by mouth daily.   TRAVOPROST, BAK FREE, (TRAVATAN) 0.004 % SOLN OPHTHALMIC SOLUTION    Place 1 drop into both eyes daily at 8 pm.    VITAMIN B-12  (CYANOCOBALAMIN) 500 MCG TABLET    Take 500 mcg by mouth every evening.   Modified Medications   No medications on file  Discontinued Medications   IPRATROPIUM-ALBUTEROL (DUONEB) 0.5-2.5 (3) MG/3ML SOLN    Take 3 mLs by nebulization daily as needed.   LIDOCAINE (LIDODERM) 5 %    Place 1 patch onto the skin daily. Apply 1 patch to left lower back/hip every morning.  Remove & Discard patch within 12 hours or as directed by MD    Review of Systems  Unable to perform ROS: Dementia    Vitals:   03/15/17 1031  BP: 134/70  Pulse: 74  Resp: 16  Temp: 97.9 F (36.6 C)  SpO2: 96%  Weight: 154 lb 12.8 oz (70.2 kg)  Height: 5\' 7"  (1.702 m)   Body mass index is 24.25 kg/m.  Physical Exam  Constitutional: She appears well-developed.  Frail appearing in NAD, sitting up in bed  HENT:  Mouth/Throat: No oropharyngeal exudate.  Oropharynx cobblestoning with redness. No exudate  Eyes: Pupils are equal, round, and reactive to light. No scleral icterus.  Neck: Neck supple. Carotid bruit is not present. No tracheal deviation present.  Cardiovascular: Normal rate, regular rhythm and intact distal pulses.  Exam reveals no gallop and no friction rub.   Murmur (1/6 SEM) heard. No LE edema b/l. no calf TTP.   Pulmonary/Chest: Effort normal and breath sounds normal. No stridor. No respiratory distress. She has no wheezes. She has no rales.  Abdominal: Soft. Normal appearance and bowel sounds are normal. She exhibits distension. She exhibits no mass. There is no hepatomegaly. There is no tenderness. There is no rigidity, no rebound and no guarding. No hernia.  Musculoskeletal: She exhibits edema (right medial leg with palpable cord).  Lymphadenopathy:    She has no cervical adenopathy.  Neurological: She is alert.  Skin: Skin is warm and dry. No rash noted.  Psychiatric: She has a normal mood and affect. Her behavior is normal. Judgment and thought content normal.     Labs reviewed: Nursing Home  on 03/15/2017  Component Date Value Ref Range Status  . Vit D, 25-Hydroxy 02/21/2017 45.39   Final  . Vitamin B-12 02/21/2017 685   Final  Abstract on 02/09/2017  Component Date Value Ref Range Status  . Potassium 02/07/2017 4.6  3.4 - 5.3 Final  Abstract on 01/31/2017  Component Date Value Ref Range Status  . Hemoglobin 01/30/2017 9.9* 12.0 - 16.0 Final  . HCT 01/30/2017 29* 36 - 46 Final  . Neutrophils Absolute 01/30/2017 2   Final  . Platelets 01/30/2017 91* 150 - 399 Final  . WBC 01/30/2017 4.4   Final  . Glucose 01/30/2017 232   Final  . BUN 01/30/2017 25* 4 - 21 Final  . Creatinine 01/30/2017 1.5* 0.5 - 1.1 Final  . Potassium 01/30/2017 3.8  3.4 - 5.3 Final  . Sodium 01/30/2017 139  137 - 147 Final    No results found.   Assessment/Plan   ICD-10-CM   1. Allergic rhinitis, unspecified seasonality, unspecified trigger J30.9  2. Chronic atrial fibrillation (HCC) I48.2   3. FTT (failure to thrive) in adult R62.7    weight is stable  4. Diastolic CHF, chronic (HCC) I50.32   5. Primary osteoarthritis involving multiple joints M15.0   6. Major depressive disorder with single episode, in full remission ([place]) F32.5   7. Dementia associated with other underlying disease without behavioral disturbance F02.80   8. Volvulus of sigmoid colon (HCC) K56.2    stable    Cont flonase and claritin for seasonal allergy  Reassurance given regarding her seasonal allergy symptoms  Cont other meds as ordered  PT/Ot as indicated  F/u with specialists as indicated  Nutritional supplements as indicated  Will follow  Ilene Witcher S. Perlie Gold  [facility] [address] [place], [place] [address] [phone] Cell (Monday-Friday 8 AM - 5 PM) [phone] After 5 PM and follow prompts

## 2017-03-26 ENCOUNTER — Encounter: Payer: Self-pay | Admitting: Adult Health

## 2017-03-26 ENCOUNTER — Non-Acute Institutional Stay (SKILLED_NURSING_FACILITY): Payer: Medicare Other | Admitting: Adult Health

## 2017-03-26 DIAGNOSIS — J209 Acute bronchitis, unspecified: Secondary | ICD-10-CM | POA: Diagnosis not present

## 2017-03-26 LAB — BASIC METABOLIC PANEL
BUN: 26 — AB (ref 4–21)
CREATININE: 1.6 — AB (ref 0.5–1.1)
GLUCOSE: 174
Potassium: 4.8 (ref 3.4–5.3)
Sodium: 140 (ref 137–147)

## 2017-03-26 LAB — CBC AND DIFFERENTIAL
HCT: 28 — AB (ref 36–46)
Hemoglobin: 9.8 — AB (ref 12.0–16.0)
NEUTROS ABS: 2
PLATELETS: 102 — AB (ref 150–399)
WBC: 4.4

## 2017-03-26 NOTE — Progress Notes (Signed)
Location:   [facility] Room Number: 671 [id number] Place of Service:  SNF (31)   CODE STATUS: DNR  Allergies  Allergen Reactions  . Aspirin Other (See Comments)    [name] only    Chief Complaint  Patient presents with  . Acute Visit    Pt concerns    HPI:  Her son and daughter are concerned about her increased confusion. She is telling me that she is coughing and some shortness of breath. She denies any abdominal pain and denies any dysuria or changes in her urine. Her electrolytes are balanced at this time. There are no reports of fevers present. He family is worried about her k+ level and possible UTI. We did discuss what arcuately makes an uti; such as fever; dysuria; abdominal pain.  They verbalized understanding; but continued to insist that her urine be tested.     Past Medical History:  Diagnosis Date  . Altered mental status   . Anemia   . Atrial fibrillation ([place])   . CKD (chronic kidney disease) stage 3, GFR 30-59 ml/min (HCC) 02/27/2015  . Constipation 02/27/2015  . Coronary artery disease   . DEMENTIA   . Depression   . Diabetes mellitus   . Edema of lower extremity 07/13/11   right leg more swollen than left leg  . Encephalopathy   . Enlarged heart   . Esophageal dysmotility 07/02/12  . GERD (gastroesophageal reflux disease)   . History of adenomatous polyp of colon 06/24/99  . Horseshoe kidney   . Hyperlipemia   . Hypertension   . Pancreatic lesion 05/22/11   no further workup  per PCP/family due to age  . Parkinson disease ([place])   . Peripheral neuropathy   . Sigmoid volvulus ([place]) 05/24/2013  . Thrombophlebitis   . TIA (transient ischemic attack) 12/19/2012    Past Surgical History:  Procedure Laterality Date  . ABDOMINAL HYSTERECTOMY    . BOWEL DECOMPRESSION N/A 04/18/2016   Procedure: BOWEL DECOMPRESSION;  Surgeon: [name], MD;  Location: [facility];  Service: Endoscopy;  Laterality: N/A;  . BREAST SURGERY     2 benign tumors  removed left breast  . CHOLECYSTECTOMY    . ESOPHAGEAL DILATION     several times by Dr. [name]  . ESOPHAGOGASTRODUODENOSCOPY (EGD) WITH ESOPHAGEAL DILATION N/A 08/01/2012   Procedure: ESOPHAGOGASTRODUODENOSCOPY (EGD) WITH ESOPHAGEAL DILATION;  Surgeon: [name], MD;  Location: [facility];  Service: Endoscopy;  Laterality: N/A;  with c-arm savory dilators  . ESOPHAGOGASTRODUODENOSCOPY (EGD) WITH PROPOFOL N/A 03/12/2015   Procedure: ESOPHAGOGASTRODUODENOSCOPY (EGD) WITH PROPOFOL;  Surgeon: [name], MD;  Location: [facility];  Service: Endoscopy;  Laterality: N/A;  . FLEXIBLE SIGMOIDOSCOPY N/A 05/24/2013   Procedure: FLEXIBLE SIGMOIDOSCOPY;  Surgeon: [name], MD;  Location: [facility];  Service: Endoscopy;  Laterality: N/A;  . FLEXIBLE SIGMOIDOSCOPY N/A 05/26/2013   Procedure:  flex with decompression of sigmoid volvulus;  Surgeon: [name], MD;  Location: [facility];  Service: Endoscopy;  Laterality: N/A;  . FLEXIBLE SIGMOIDOSCOPY N/A 12/20/2015   Procedure: FLEXIBLE SIGMOIDOSCOPY;  Surgeon: Milus Banister, MD;  Location: [facility];  Service: Endoscopy;  Laterality: N/A;  . FLEXIBLE SIGMOIDOSCOPY N/A 04/17/2016   Procedure: FLEXIBLE SIGMOIDOSCOPY;  Surgeon: Ladene Artist, MD;  Location: [facility];  Service: Endoscopy;  Laterality: N/A;  . FLEXIBLE SIGMOIDOSCOPY N/A 04/18/2016   Procedure: FLEXIBLE SIGMOIDOSCOPY;  Surgeon: [name], MD;  Location: [facility];  Service: Endoscopy;  Laterality:  N/A;  . FOOT SURGERY     benign tumors from foot  . NOSE SURGERY    . SHOULDER SURGERY  2001   left clavicle excision and acromioplasty  . TOTAL HIP ARTHROPLASTY      Social History   Social History  . Marital status: Widowed    Spouse name: N/A  . Number of children: 5  . Years of education: 10th   Occupational History  . Retired    Social History Main Topics  . Smoking status: Never Smoker  . Smokeless tobacco: Never Used  . Alcohol use  No  . Drug use: No  . Sexual activity: No   Other Topics Concern  . Not on file   Social History Narrative   Pt lives at [place]    Caffeine Use: very small amount daily   Family History  Problem Relation Age of Onset  . Cancer Unknown   . Heart disease Unknown       VITAL SIGNS BP (!) 167/79   Pulse 84   Temp 97.8 F (36.6 C)   Resp 18   Ht 5\' 7"  (1.702 m)   Wt 149 lb 9.6 oz (67.9 kg)   SpO2 97%   BMI 23.43 kg/m   Patient's Medications  New Prescriptions   No medications on file  Previous Medications   AMLODIPINE (NORVASC) 2.5 MG TABLET    Take 2.5 mg by mouth every morning. Hold for BP < 100/60 or HR < 60   ASPIRIN EC 81 MG TABLET    Take 81 mg by mouth daily.   BACLOFEN (LIORESAL) 10 MG TABLET    Take 5 mg by mouth at bedtime.   BENZOCAINE-MENTHOL (CHLORASEPTIC) 6-10 MG LOZENGE    Take 1 lozenge by mouth as needed for sore throat.   CARVEDILOL (COREG) 3.125 MG TABLET    Take 3.125 mg by mouth daily.   CRANBERRY 475 MG CAPS    Take 1 capsule by mouth 2 (two) times daily.    DOCUSATE SODIUM (COLACE) 100 MG CAPSULE    Take 200 mg by mouth at bedtime.    DORZOLAMIDE (TRUSOPT) 2 % OPHTHALMIC SOLUTION    Place 1 drop into both eyes 3 (three) times daily.   ESCITALOPRAM (LEXAPRO) 10 MG TABLET    Take 10 mg by mouth daily.   FAMOTIDINE (PEPCID) 20 MG TABLET    Take 20 mg by mouth at bedtime.   FERROUS SULFATE 220 (44 FE) MG/5ML SOLUTION    Give 5 ml by mouth in the evening for supplement   FLUTICASONE (FLONASE) 50 MCG/ACT NASAL SPRAY    Place 2 sprays into both nostrils at bedtime.   GABAPENTIN (NEURONTIN) 100 MG CAPSULE    Take 100 mg by mouth at bedtime.   HYDROCODONE-ACETAMINOPHEN (NORCO/VICODIN) 5-325 MG TABLET    Take 0.5 tablet by mouth every morning for pain.  Hold for sedation.    May take 0.5 tablet by mouth every 8 hours as needed for moderate to severe pain   HYDROCODONE-ACETAMINOPHEN (NORCO/VICODIN) 5-325 MG TABLET    Give 0.5 mg tablet by mouth once daily  for pain.   LIDOCAINE 4 % PTCH    Apply topically. Apply to Left hip topically at bedtime.  On for 12 hours then remove   LINACLOTIDE (LINZESS) 145 MCG CAPS CAPSULE    Take 145 mcg by mouth daily before breakfast.    LORATADINE (CLARITIN) 10 MG TABLET    Take 10 mg by mouth daily before  breakfast.    MIRTAZAPINE (REMERON) 7.5 MG TABLET    Take 7.5 mg by mouth at bedtime.   MULTIPLE VITAMINS-MINERALS (CERTAGEN PO)    Take 1 tablet by mouth daily.    MULTIPLE VITAMINS-MINERALS (HAIR SKIN AND NAILS FORMULA) TABS    Take by mouth. Give 2 tablets by mouth daily   OMEPRAZOLE (PRILOSEC) 20 MG CAPSULE    Take 20 mg by mouth daily.   ONDANSETRON (ZOFRAN) 8 MG TABLET    Take 8 mg by mouth every 8 (eight) hours as needed for nausea or vomiting.   PHENAZOPYRIDINE HCL (AZO TABS PO)    Take 1 tablet by mouth at bedtime.   POLYETHYLENE GLYCOL (MIRALAX / GLYCOLAX) PACKET    Take 17 g by mouth daily at 6 (six) AM.    POTASSIUM CHLORIDE ER 20 MEQ TBCR    Give 40 meq by mouth every morning and 20 meq by mouth in the afternoon   SENNA (SENOKOT) 8.6 MG TABS TABLET    Take 1 tablet (8.6 mg total) by mouth 2 (two) times daily.   SODIUM CHLORIDE (OCEAN) 0.65 % SOLN NASAL SPRAY    Place 1 spray into both nostrils as directed. Four times daily And every 24 hours as needed to moisturize nasal passages.   TORSEMIDE (DEMADEX) 20 MG TABLET    Take 20 mg by mouth daily.   TRAVOPROST, BAK FREE, (TRAVATAN) 0.004 % SOLN OPHTHALMIC SOLUTION    Place 1 drop into both eyes daily at 8 pm.    VITAMIN B-12 (CYANOCOBALAMIN) 500 MCG TABLET    Take 500 mcg by mouth every evening.   Modified Medications   No medications on file  Discontinued Medications   CHOLECALCIFEROL (VITAMIN D) 1000 UNITS TABLET    Take 1,000 Units by mouth every evening.      SIGNIFICANT DIAGNOSTIC EXAMS  PREVIOUS  02-15-15: abdominal ultrasound: 1. Gallbladder not visualized. 2. No hydronephrosis no renal calculus; 3. The pancreas not well visualized; which  could be related to gassy abdomen but an appearance which might represent pancreatitis in the appropriate clinical setting.   03-12-15: upper endoscopy: duodenal bulb 1.5-2.0 cm ulcer with clot at base. No active bleeding. Surrounding mucosa was edematous. Biopsy of the surrounding mucosa were done.   12-20-15: ct of abdomen and pelvis: 1. Loosely twisted sigmoid volvulus with transition point. Proximal distention is improved compared to radiography earlier today suggesting partial interval decompression. 2. Chronic findings are stable and described above.  12-20-15: flex sigmoidoscopy: chronic intermittent sigmoid volvulus. The site of volvulus was only slightly twisted during this exam, easily passed with colonoscope and the air filled dilated colon proximal to it was suctioned extensively. Her abdomen was flat, normal after the exam.   02-23-16: renal ultrasound: normal right kidney slightly small left kidney. Right 1.9 cm cyst no hydronephrosis or stones   09-05-16: right lower extremity doppler: DVT: neg   NO NEW EXAMS    LABS REVIEWED PREVIOUS     03-11-16: glucose 196; bun 18.4; creat 1.28; k+ 3.7; na++ 141  03-19-16: A/B neg for influenza 04-01-16: glucose 139; bun 15.7; creat 1.16; k+ 3.1; na++ 144 04-07-16: glucose 140; bun 18.2; creat 1.37; k+ 4.1; na++ 143  04-17-16: wbc 5.0; hgb 11.1; hct 33.3; mcv 100.6; plt 119; glucose 178; bun 19; creat 1.22; k+ 3.7; na++ 141; liver normal albumin 3.8 04-18-16: wbc 4.0; hgb 9.3; hct 28.8; mcv 101.1; plt 93; glucose 124; bun 17; creat 1.20; k+ 2.9; na++ 141; liver normal  albumin 3.2; mag 1.7 phos 3.7; tsh 2.572; hgb a1c 6.9 04-20-16: wbc 5.5; hgb 10.2; hct 30.9; mcv 100.0; plt 97; glucose 103; bun 14; create 1.00; k+ 3.7; na++ 143; mag 1.8  04-24-16:glucose 132; bun 17; creat 1.05; k+ 4.3; na++ 138  08-12-16: wb 4.0; hgb 10.7; hct 32.1 ;mcv 103.2; plt 95; glucose 111 bun 33.0; creat 1.57; k+ 4.4 ;na++ 143; liver normal albumin 3.5  11-24-16: wbc 3.4;  hgb 9.9; hct 29.7; mcv 106; plt 98; glucose 122; bun 17.4; creat 1.22; k+ 3.8; na++ 144; ca 8.8; chol 133; ldl 78; trig 75; hdl 41  01-30-17: wbc 4.4; hgb 9.9; hct 28.7; mcv 102.5; plt 91; glucose glucose 232; bun 25; creat 1.50; k+ 3.8; na++ 139; ca 8.7 02-07-17: K+ 4.6  TODAY:   03-26-17: wbvc 4.4; hgb 9.8; hct 28.1; mcv 104.1; plt 102; glucose 174; bun 25.7; creat 1.60; k+ 4.8; na++ 140; ca 8.8; UA: + nitrate    Review of Systems  Constitutional: Negative for fever and malaise/fatigue.  Respiratory: Positive for cough and shortness of breath.   Cardiovascular: Negative for chest pain, palpitations and leg swelling.  Gastrointestinal: Negative for abdominal pain, constipation and heartburn.  Genitourinary: Negative for dysuria.  Musculoskeletal: Negative for back pain, joint pain and myalgias.  Skin: Negative.   Neurological: Negative for dizziness.  Psychiatric/Behavioral: The patient is not nervous/anxious.    Physical Exam  Constitutional: No distress.  Frail   HENT:  Has mild tmj tenderness   Eyes: Conjunctivae are normal.  Neck: Neck supple. No thyromegaly present.  Cardiovascular: Normal rate, regular rhythm, normal heart sounds and intact distal pulses.  Pulmonary/Chest: Effort normal. She has wheezes. She has rales.  Abdominal: Soft. Bowel sounds are normal. She exhibits distension. There is no tenderness.  Musculoskeletal: She exhibits no edema.  Is able to move all extremities   Lymphadenopathy:    She has no cervical adenopathy.  Neurological: She is alert.  Skin: Skin is warm and dry. She is not diaphoretic.  Psychiatric: She has a normal mood and affect.     ASSESSMENT/ PLAN:  TODAY:   1. Acute bronchitis: will begin Z-pac will begin 1 disckus of advair 100/50 twice daily and will begin tessalon perle 100 mg every 8 hours as needed    MD is aware of resident's narcotic use and is in agreement with current plan of care. We will attempt to wean resident as  apropriate     Ok Edwards NP [facility]  Contact [phone] Monday through Friday 8am- 5pm  After hours call [phone]

## 2017-04-05 ENCOUNTER — Other Ambulatory Visit: Payer: Self-pay

## 2017-04-05 MED ORDER — HYDROCODONE-ACETAMINOPHEN 5-325 MG PO TABS: ORAL_TABLET | ORAL | 0 refills | Status: DC

## 2017-04-05 NOTE — Telephone Encounter (Signed)
RX faxed to [facility] @ [phone], phone number [phone] 

## 2017-04-16 DIAGNOSIS — J209 Acute bronchitis, unspecified: Secondary | ICD-10-CM | POA: Insufficient documentation

## 2017-04-19 DIAGNOSIS — G4700 Insomnia, unspecified: Secondary | ICD-10-CM | POA: Diagnosis not present

## 2017-04-19 DIAGNOSIS — F0391 Unspecified dementia with behavioral disturbance: Secondary | ICD-10-CM | POA: Diagnosis not present

## 2017-04-19 DIAGNOSIS — F411 Generalized anxiety disorder: Secondary | ICD-10-CM | POA: Diagnosis not present

## 2017-04-19 DIAGNOSIS — F329 Major depressive disorder, single episode, unspecified: Secondary | ICD-10-CM | POA: Diagnosis not present

## 2017-04-23 ENCOUNTER — Encounter: Payer: Self-pay | Admitting: Adult Health

## 2017-04-23 ENCOUNTER — Non-Acute Institutional Stay (SKILLED_NURSING_FACILITY): Payer: Medicare Other | Admitting: Adult Health

## 2017-04-23 DIAGNOSIS — I5032 Chronic diastolic (congestive) heart failure: Secondary | ICD-10-CM | POA: Diagnosis not present

## 2017-04-23 DIAGNOSIS — I482 Chronic atrial fibrillation, unspecified: Secondary | ICD-10-CM

## 2017-04-23 DIAGNOSIS — D638 Anemia in other chronic diseases classified elsewhere: Secondary | ICD-10-CM

## 2017-04-23 DIAGNOSIS — N183 Chronic kidney disease, stage 3 unspecified: Secondary | ICD-10-CM

## 2017-04-23 DIAGNOSIS — M150 Primary generalized (osteo)arthritis: Secondary | ICD-10-CM

## 2017-04-23 DIAGNOSIS — K219 Gastro-esophageal reflux disease without esophagitis: Secondary | ICD-10-CM | POA: Diagnosis not present

## 2017-04-23 DIAGNOSIS — I110 Hypertensive heart disease with heart failure: Secondary | ICD-10-CM | POA: Diagnosis not present

## 2017-04-23 DIAGNOSIS — M26609 Unspecified temporomandibular joint disorder, unspecified side: Secondary | ICD-10-CM | POA: Diagnosis not present

## 2017-04-23 DIAGNOSIS — J9611 Chronic respiratory failure with hypoxia: Secondary | ICD-10-CM | POA: Diagnosis not present

## 2017-04-23 DIAGNOSIS — M159 Polyosteoarthritis, unspecified: Secondary | ICD-10-CM

## 2017-04-23 DIAGNOSIS — K562 Volvulus: Secondary | ICD-10-CM | POA: Diagnosis not present

## 2017-04-23 NOTE — Progress Notes (Deleted)
d 

## 2017-04-23 NOTE — Progress Notes (Signed)
Provider:  Ok Edwards, NP Location:  [place] Room Number: 301 A Place of Service:  SNF (31)   PCP: [name], DO Patient Care Team: [name], DO as PCP - General (Internal Medicine) Nyoka Cowden Phylis Bougie, NP as Nurse Practitioner (Nurse Practitioner)  Extended Emergency Contact Information Primary Emergency Contact: [place] Phone: [phone] Relation: Son Secondary Emergency Contact: Roberts,Lynn Address: [address]          [place], [place] [address] [name] Phone: [phone] Mobile Phone: [phone] Relation: Daughter  Code Status: DNR Goals of Care: Advanced Directive information Advanced Directives 05/07/2017  Does Patient Have a Medical Advance Directive? Yes  Type of Advance Directive Out of facility DNR (pink MOST or yellow form)  Does patient want to make changes to medical advance directive? No - Patient declined  Copy of [place] in Chart? -  Would patient like information on creating a medical advance directive? -  Pre-existing out of facility DNR order (yellow form or pink MOST form) Yellow form placed in chart (order not valid for inpatient use)      Allergies  Allergen Reactions  . Aspirin Other (See Comments)    [name]     Chief Complaint  Patient presents with  . Annual Exam    Afib; diastolic heart failure; hypertension     HPI: Patient is a 83 y.o. female seen today for an annual comprehensive examination. She has not had a hospitalization in recent months.  Her weight has remained stable over the past year range 150-160s. She does have chronic headaches due to her tmj. She has no complaints of chest pain; shortness of breath; no constipation; no diarrhea. There are no nursing concerns at this time. Her health maintenance is up to date.  She will continue to be followed for her chronic illnesses.   Past Medical History:  Diagnosis  Date  . Altered mental status   . Anemia   . Atrial fibrillation ([place])   . CKD (chronic kidney disease) stage 3, GFR 30-59 ml/min (HCC) 02/27/2015  . Constipation 02/27/2015  . Coronary artery disease   . Dementia   . Depression   . Diabetes mellitus   . Edema of lower extremity 07/13/11   right leg more swollen than left leg  . Encephalopathy   . Enlarged heart   . Esophageal dysmotility 07/02/12  . GERD (gastroesophageal reflux disease)   . History of adenomatous polyp of colon 06/24/99  . Horseshoe kidney   . Hyperlipemia   . Hypertension   . Pancreatic lesion 05/22/11   no further workup  per PCP/family due to age  . Parkinson disease ([place])   . Peripheral neuropathy   . Sigmoid volvulus ([place]) 05/24/2013  . Thrombophlebitis   . TIA (transient ischemic attack) 12/19/2012   Past Surgical History:  Procedure Laterality Date  . ABDOMINAL HYSTERECTOMY    . BOWEL DECOMPRESSION N/A 04/18/2016   Procedure: BOWEL DECOMPRESSION;  Surgeon: [name], MD;  Location: [facility];  Service: Endoscopy;  Laterality: N/A;  . BREAST SURGERY     2 benign tumors removed left breast  . CHOLECYSTECTOMY    . ESOPHAGEAL DILATION     several times by Dr. [name]  . ESOPHAGOGASTRODUODENOSCOPY (EGD) WITH ESOPHAGEAL DILATION N/A 08/01/2012   Procedure: ESOPHAGOGASTRODUODENOSCOPY (EGD) WITH ESOPHAGEAL DILATION;  Surgeon: [name], MD;  Location: [facility];  Service: Endoscopy;  Laterality: N/A;  with  c-arm savory dilators  . ESOPHAGOGASTRODUODENOSCOPY (EGD) WITH PROPOFOL N/A 03/12/2015   Procedure: ESOPHAGOGASTRODUODENOSCOPY (EGD) WITH PROPOFOL;  Surgeon: [name], MD;  Location: [facility];  Service: Endoscopy;  Laterality: N/A;  . FLEXIBLE SIGMOIDOSCOPY N/A 05/24/2013   Procedure: FLEXIBLE SIGMOIDOSCOPY;  Surgeon: [name], MD;  Location: [facility];  Service: Endoscopy;  Laterality: N/A;  . FLEXIBLE SIGMOIDOSCOPY N/A 05/26/2013   Procedure:  flex with decompression of  sigmoid volvulus;  Surgeon: [name], MD;  Location: [facility];  Service: Endoscopy;  Laterality: N/A;  . FLEXIBLE SIGMOIDOSCOPY N/A 12/20/2015   Procedure: FLEXIBLE SIGMOIDOSCOPY;  Surgeon: Milus Banister, MD;  Location: [facility];  Service: Endoscopy;  Laterality: N/A;  . FLEXIBLE SIGMOIDOSCOPY N/A 04/17/2016   Procedure: FLEXIBLE SIGMOIDOSCOPY;  Surgeon: Ladene Artist, MD;  Location: [facility];  Service: Endoscopy;  Laterality: N/A;  . FLEXIBLE SIGMOIDOSCOPY N/A 04/18/2016   Procedure: FLEXIBLE SIGMOIDOSCOPY;  Surgeon: [name], MD;  Location: [facility];  Service: Endoscopy;  Laterality: N/A;  . FOOT SURGERY     benign tumors from foot  . NOSE SURGERY    . SHOULDER SURGERY  2001   left clavicle excision and acromioplasty  . TOTAL HIP ARTHROPLASTY      reports that  has never smoked. she has never used smokeless tobacco. She reports that she does not drink alcohol or use drugs. Social History   Socioeconomic History  . Marital status: Widowed    Spouse name: Not on file  . Number of children: 5  . Years of education: 10th  . Highest education level: Not on file  Social Needs  . Financial resource strain: Not on file  . Food insecurity - worry: Not on file  . Food insecurity - inability: Not on file  . Transportation needs - medical: Not on file  . Transportation needs - non-medical: Not on file  Occupational History  . Occupation: Retired  Tobacco Use  . Smoking status: Never Smoker  . Smokeless tobacco: Never Used  Substance and Sexual Activity  . Alcohol use: No  . Drug use: No  . Sexual activity: No  Other Topics Concern  . Not on file  Social History Narrative   Pt lives at [place]    Caffeine Use: very small amount daily   Family History  Problem Relation Age of Onset  . Cancer Unknown   . Heart disease Unknown     Vitals:   04/23/17 1340  BP: (!) 150/78  Pulse: 90  Resp: 18  Temp: 97.7 F (36.5 C)  SpO2: 96%  Weight: 151 lb  4.8 oz (68.6 kg)  Height: 5\' 7"  (1.702 m)   Body mass index is 23.7 kg/m.   Outpatient Encounter Medications as of 04/23/2017  Medication Sig  . amLODipine (NORVASC) 2.5 MG tablet Take 2.5 mg by mouth every morning. Hold for BP < 100/60 or HR < 60  . aspirin EC 81 MG tablet Take 81 mg by mouth daily.  . baclofen (LIORESAL) 10 MG tablet Take 5 mg by mouth at bedtime.  . benzocaine-menthol (CHLORASEPTIC) 6-10 MG lozenge Take 1 lozenge by mouth as needed for sore throat.  . carvedilol (COREG) 3.125 MG tablet Take 3.125 mg by mouth daily.  . Cranberry 475 MG CAPS Take 1 capsule by mouth 2 (two) times daily.   Marland Kitchen docusate sodium (COLACE) 100 MG capsule Take 200 mg by mouth at bedtime.   . dorzolamide (TRUSOPT) 2 % ophthalmic solution Place 1 drop  into both eyes 3 (three) times daily.  Marland Kitchen escitalopram (LEXAPRO) 10 MG tablet Take 10 mg by mouth daily.  . famotidine (PEPCID) 20 MG tablet Take 20 mg by mouth at bedtime.  . ferrous sulfate 220 (44 Fe) MG/5ML solution Give 5 ml by mouth in the evening for supplement  . fluticasone (FLONASE) 50 MCG/ACT nasal spray Place 2 sprays into both nostrils at bedtime.  . [EXPIRED] fluticasone furoate-vilanterol (BREO ELLIPTA) 100-25 MCG/INH AEPB Inhale 1 puff daily into the lungs.  . gabapentin (NEURONTIN) 100 MG capsule Take 100 mg by mouth at bedtime.  Marland Kitchen HYDROcodone-acetaminophen (NORCO/VICODIN) 5-325 MG tablet Give 0.5 mg tablet by mouth once daily for pain.  . Lidocaine 4 % PTCH Apply topically. Apply to Left hip topically at bedtime.  On for 12 hours then remove  . Linaclotide (LINZESS) 145 MCG CAPS capsule Take 145 mcg by mouth daily before breakfast.   . loratadine (CLARITIN) 10 MG tablet Take 10 mg by mouth daily before breakfast.   . Multiple Vitamins-Minerals (CERTAGEN PO) Take 1 tablet by mouth daily.   . Multiple Vitamins-Minerals (HAIR SKIN AND NAILS FORMULA) TABS Take by mouth. Give 2 tablets by mouth daily  . omeprazole (PRILOSEC) 20 MG capsule  Take 20 mg by mouth daily.  . ondansetron (ZOFRAN) 8 MG tablet Take 8 mg by mouth every 8 (eight) hours as needed for nausea or vomiting.  Marland Kitchen Phenazopyridine HCl (AZO TABS PO) Take 1 tablet by mouth at bedtime.  . polyethylene glycol (MIRALAX / GLYCOLAX) packet Take 17 g by mouth daily at 6 (six) AM.   . Potassium Chloride ER 20 MEQ TBCR Give 40 meq by mouth every morning and 20 meq by mouth in the afternoon  . senna (SENOKOT) 8.6 MG TABS tablet Take 1 tablet (8.6 mg total) by mouth 2 (two) times daily.  . sodium chloride (OCEAN) 0.65 % SOLN nasal spray Place 1 spray into both nostrils as directed. Four times daily And every 24 hours as needed to moisturize nasal passages.  . torsemide (DEMADEX) 20 MG tablet Take 20 mg by mouth daily.  . Travoprost, BAK Free, (TRAVATAN) 0.004 % SOLN ophthalmic solution Place 1 drop into both eyes daily at 8 pm.   . vitamin B-12 (CYANOCOBALAMIN) 500 MCG tablet Take 500 mcg by mouth every evening.   . [DISCONTINUED] HYDROcodone-acetaminophen (NORCO/VICODIN) 5-325 MG tablet Take 0.5 tablet by mouth every morning for pain.  Hold for sedation.    May take 0.5 tablet by mouth every 8 hours as needed for moderate to severe pain (Patient not taking: Reported on 05/04/2017)   No facility-administered encounter medications on file as of 04/23/2017.    SIGNIFICANT DIAGNOSTIC EXAMS  PREVIOUS  02-15-15: abdominal ultrasound: 1. Gallbladder not visualized. 2. No hydronephrosis no renal calculus; 3. The pancreas not well visualized; which could be related to gassy abdomen but an appearance which might represent pancreatitis in the appropriate clinical setting.   03-12-15: upper endoscopy: duodenal bulb 1.5-2.0 cm ulcer with clot at base. No active bleeding. Surrounding mucosa was edematous. Biopsy of the surrounding mucosa were done.   12-20-15: ct of abdomen and pelvis: 1. Loosely twisted sigmoid volvulus with transition point. Proximal distention is improved compared to  radiography earlier today suggesting partial interval decompression. 2. Chronic findings are stable and described above.  12-20-15: flex sigmoidoscopy: chronic intermittent sigmoid volvulus. The site of volvulus was only slightly twisted during this exam, easily passed with colonoscope and the air filled dilated colon proximal to it  was suctioned extensively. Her abdomen was flat, normal after the exam.   02-23-16: renal ultrasound: normal right kidney slightly small left kidney. Right 1.9 cm cyst no hydronephrosis or stones   09-05-16: right lower extremity doppler: DVT: neg   NO NEW EXAMS    LABS REVIEWED PREVIOUS     04-07-16: glucose 140; bun 18.2; creat 1.37; k+ 4.1; na++ 143  04-17-16: wbc 5.0; hgb 11.1; hct 33.3; mcv 100.6; plt 119; glucose 178; bun 19; creat 1.22; k+ 3.7; na++ 141; liver normal albumin 3.8 04-18-16: wbc 4.0; hgb 9.3; hct 28.8; mcv 101.1; plt 93; glucose 124; bun 17; creat 1.20; k+ 2.9; na++ 141; liver normal albumin 3.2; mag 1.7 phos 3.7; tsh 2.572; hgb a1c 6.9 04-20-16: wbc 5.5; hgb 10.2; hct 30.9; mcv 100.0; plt 97; glucose 103; bun 14; create 1.00; k+ 3.7; na++ 143; mag 1.8  04-24-16:glucose 132; bun 17; creat 1.05; k+ 4.3; na++ 138  08-12-16: wb 4.0; hgb 10.7; hct 32.1 ;mcv 103.2; plt 95; glucose 111 bun 33.0; creat 1.57; k+ 4.4 ;na++ 143; liver normal albumin 3.5  11-24-16: wbc 3.4; hgb 9.9; hct 29.7; mcv 106; plt 98; glucose 122; bun 17.4; creat 1.22; k+ 3.8; na++ 144; ca 8.8; chol 133; ldl 78; trig 75; hdl 41  01-30-17: wbc 4.4; hgb 9.9; hct 28.7; mcv 102.5; plt 91; glucose glucose 232; bun 25; creat 1.50; k+ 3.8; na++ 139; ca 8.7 02-07-17: K+ 4.6 03-26-17: wbvc 4.4; hgb 9.8; hct 28.1; mcv 104.1; plt 102; glucose 174; bun 25.7; creat 1.60; k+ 4.8; na++ 140; ca 8.8; UA: + nitrate  TODAY:  02-21-17: vit B 1: 40; vit B 12: 685; vit D 45.39     Review of Systems  Constitutional: Negative for malaise/fatigue.  Respiratory: Negative for cough and shortness of breath.     Cardiovascular: Negative for chest pain, palpitations and leg swelling.  Gastrointestinal: Negative for abdominal pain, constipation and heartburn.  Genitourinary: Negative for dysuria.  Musculoskeletal: Negative for back pain, joint pain and myalgias.  Skin: Negative.   Neurological: Positive for headaches. Negative for dizziness.       Has chronic headaches comes from jaws to her temples   Psychiatric/Behavioral: The patient is not nervous/anxious.     Physical Exam  Constitutional: No distress.  Thin   HENT:  Mild TMJ tenderness   Neck: Neck supple. No thyromegaly present.  Cardiovascular: Normal rate, regular rhythm and intact distal pulses.  Murmur heard. 1/6   Pulmonary/Chest: Effort normal and breath sounds normal. No respiratory distress.  Abdominal: Soft. Bowel sounds are normal. She exhibits distension. There is no tenderness.  Musculoskeletal: She exhibits edema.  Has 1+ right lower extremity Is able to move all extremities   Lymphadenopathy:    She has no cervical adenopathy.  Neurological: She is alert.  Skin: Skin is warm and dry. She is not diaphoretic.  Psychiatric: She has a normal mood and affect.      ASSESSMENT/ PLAN:  TODAY:   1. Anemia: will continue iron daily hgb is 9.8  2. Chronic afib: is stable will continue coreg 3.125 mg daily for rate control. Will continue asa 81mg  daily .    3. Chronic respiratory failure: is stable  is 02 dependent. Will continue Breo ellipta 100/25 mcg 1 puff daily   4. Chronic pain due to osteoarthritis: is stable   will continue lidoderm to lower back daily; will continue baclofen 5 mg nightly for leg spasticity and is taking 1/2 vidocin 5/325 mg daily is  taking neurontin 100 mg nightly for neuropathic pain    5. Depression: is stable receives benefit form lexapro 10 mg daily   6. Chronic constipation  Has recurrent sigmoid volvulus: will continue colace 200 mg daily linzess 145 mcg daily; senna twice daily; miralax  daily   Her family has decided upon conservative treatment including repeat decompression by endoscope if needed.   7. Glaucoma: will continue  trusopt to both eyes three times daily; travatan to both eyes nightly   8. CKD stage III: without change  bun/creat 25.7/1.60  9. Hypokalemia: k+ 4.8;  will continue k+ 40 meq in the AM and 20 meq in the PM. Her son has been told in the past by doctors that she needs to keep her k+ on at least 4.0. If her k+ is less than 4 she could become confused.   10. Hypertension: is stable  b/p 150/78  will continue norvasc 2.5 mg daily coreg 3.15 mg daily   11. Chronic diastolic heart failure: is stable will continue demadex 20 mg daily   Will not make changes will monitor   12. Duodenal ulcer: is stable  will continue prilosec 20 mg  daily  Has zofran 8 mg every 8 hours as needed  Will continue pepcid 20 mg nightly to help with gerd symptoms and to avoid using PPI twice daily   13. Allergic rhinitis:is stable  will continue flonase nightly and claritin 10 mg daily  14. Chronic UTI: is without recent UTI: will continue AZO nightly and cranberry twice daily will monitor   Time spent with patient: 40 minutes including medical record review; speaking with staff and patient: she is a poor historian but did discuss her medications; and concerns. She did verbalize understanding.    MD is aware of resident's narcotic use and is in agreement with current plan of care. We will wean dosage as appropriate for resident     Ok Edwards NP [facility]  Contact [phone] Monday through Friday 8am- 5pm  After hours call [phone]

## 2017-05-07 ENCOUNTER — Non-Acute Institutional Stay (SKILLED_NURSING_FACILITY): Payer: Medicare Other | Admitting: Internal Medicine

## 2017-05-07 ENCOUNTER — Encounter: Payer: Self-pay | Admitting: Internal Medicine

## 2017-05-07 DIAGNOSIS — M150 Primary generalized (osteo)arthritis: Secondary | ICD-10-CM

## 2017-05-07 DIAGNOSIS — M26609 Unspecified temporomandibular joint disorder, unspecified side: Secondary | ICD-10-CM | POA: Diagnosis not present

## 2017-05-07 DIAGNOSIS — M159 Polyosteoarthritis, unspecified: Secondary | ICD-10-CM

## 2017-05-07 DIAGNOSIS — F325 Major depressive disorder, single episode, in full remission: Secondary | ICD-10-CM

## 2017-05-07 DIAGNOSIS — H5461 Unqualified visual loss, right eye, normal vision left eye: Secondary | ICD-10-CM

## 2017-05-07 DIAGNOSIS — F0280 Dementia in other diseases classified elsewhere without behavioral disturbance: Secondary | ICD-10-CM | POA: Diagnosis not present

## 2017-05-07 DIAGNOSIS — K562 Volvulus: Secondary | ICD-10-CM | POA: Diagnosis not present

## 2017-05-07 DIAGNOSIS — J309 Allergic rhinitis, unspecified: Secondary | ICD-10-CM | POA: Diagnosis not present

## 2017-05-07 DIAGNOSIS — G8929 Other chronic pain: Secondary | ICD-10-CM

## 2017-05-07 DIAGNOSIS — H547 Unspecified visual loss: Secondary | ICD-10-CM

## 2017-05-07 DIAGNOSIS — R51 Headache: Secondary | ICD-10-CM | POA: Diagnosis not present

## 2017-05-07 NOTE — Progress Notes (Signed)
Patient ID: Maria Christian, female   DOB: June 02, 1925, 83 y.o.   MRN: [id number]   Location:  [place] Room Number: 119 A Place of Service:  SNF (31) Provider:  [place], [place], DO  Patient Care Team: [name], DO as PCP - General (Internal Medicine) Gerlene Fee, NP as Nurse Practitioner (Nurse Practitioner)  Extended Emergency Contact Information Primary Emergency Contact: [place] of [place] Phone: [phone] Relation: Son Secondary Emergency Contact: Roberts,Lynn Address: [address]          [place], [place] [address] [name] of [place] Phone: [phone] Mobile Phone: [phone] Relation: Daughter  Code Status:  DNR Goals of care: Advanced Directive information Advanced Directives 05/07/2017  Does Patient Have a Medical Advance Directive? Yes  Type of Advance Directive Out of facility DNR (pink MOST or yellow form)  Does patient want to make changes to medical advance directive? No - Patient declined  Copy of [place] in Chart? -  Would patient like information on creating a medical advance directive? -  Pre-existing out of facility DNR order (yellow form or pink MOST form) Yellow form placed in chart (order not valid for inpatient use)     Chief Complaint  Patient presents with  . Acute Visit    Family consult - headaches    HPI:  Pt is a 83 y.o. female seen today for an acute visit for HA and increased confusion. S/w daughter, [name], who lives in [place], [place] and visits several times per week, at bedside for >30 minutes regarding pt's current condition, diagnosis and prognosis. We discussed natural progression of dementia. We discussed family dynamics. [name] informed me that her brother, [name], who lives in [place], visited with pt about 3 weeks ago and then told [name] that "you know mom is dying, right?". Ebbie Ridge states, is very angry about their  mother's cognitive decline and overall health. During his visit, she did not get OOB and slept the "entire time". [name] has noticed pt c/o severe HA in right temporal and parietal are that keeps her from getting OOB. [name] has noticed increased delirium/confusion, sedation in pt. On yesterday, pt complained of reduced blurry vision in OD to [place]. According to [name], pt has been fixated on pt's oldest son, Daiva Nakayama, who lives in [place], and has not visited in [place] 5 yrs. [name] states most of the pt's children do not visit her on a regular basis. [name] herself has health problems and currently lives with her son and daughter-in-law.   Today, pt is c/a her HA. She also has chronic intermittent nausea but no emesis. No new abdominall pain. No f/c. No new weakness. No visual changes. [name] states pt has started to not recognize her some days. No nursing issues. No falls. Pt is a poor historian due to dementia. Hx obtained from chart. Last C spine xray done in Aug 2018 revealed mild arthritic changes. CT neck done in 2017 also showed significant arthritic changes.  Anemia - stable on iron daily. Hgb 9.9  Chronic afib - rate controlled on coreg 3.125 mg daily; takes ASA for anticoagualtion     Chronic respiratory failure - O2 dependent; uses duoneb prn  Chronic pain syndrome 2/2 osteoarthritis - pain controlled on lidoderm to lower back daily;  baclofen 5 mg nightly for leg spasticity; vicodin 5/325 mg 1/2 tab every 6 hours as needed for pain   Depression - mood  stable on lexapro 10 mg daily. She does benefit from this regimen  Chronic constipation with hx recurrent sigmoid volvulus - stable on colace 200 mg daily; linzess 145 mcg daily; senna twice daily; miralax daily,. Ffamily has decided upon conservative treatment including repeat endoscopic decompression if needed.   Glaucoma - stable on trusopt to both eyes three times daily; travatan to both eyes nightly   CKD  - stage 3. Cr 1.22  Hypokalemia - stable on KCl ER  40 meq daily. K 3.8  Hypertension - BP stable on norvasc 2.5 mg daily; coreg 3.15 mg daily   Chronic diastolic heart failure - stable on demadex 20 mg daily with KCl daily. No recent exacerbations     Duodenal ulcer - stable on prilosec 20 mg daily; zofran 8 mg every 8 hours as needed; pepcid 20 mg nightly to help with GERD symptoms   Allergic rhinitis - suboptimally controlled. She uses flonase nightly and claritin 10 mg daily   Past Medical History:  Diagnosis Date  . Altered mental status   . Anemia   . Atrial fibrillation ([place])   . CKD (chronic kidney disease) stage 3, GFR 30-59 ml/min (HCC) 02/27/2015  . Constipation 02/27/2015  . Coronary artery disease   . Dementia   . Depression   . Diabetes mellitus   . Edema of lower extremity 07/13/11   right leg more swollen than left leg  . Encephalopathy   . Enlarged heart   . Esophageal dysmotility 07/02/12  . GERD (gastroesophageal reflux disease)   . History of adenomatous polyp of colon 06/24/99  . Horseshoe kidney   . Hyperlipemia   . Hypertension   . Pancreatic lesion 05/22/11   no further workup  per PCP/family due to age  . Parkinson disease ([place])   . Peripheral neuropathy   . Sigmoid volvulus ([place]) 05/24/2013  . Thrombophlebitis   . TIA (transient ischemic attack) 12/19/2012   Past Surgical History:  Procedure Laterality Date  . ABDOMINAL HYSTERECTOMY    . BOWEL DECOMPRESSION N/A 04/18/2016   Procedure: BOWEL DECOMPRESSION;  Surgeon: [name], MD;  [facility];  Service: Endoscopy;  Laterality: N/A;  . BREAST SURGERY     2 benign tumors removed left breast  . CHOLECYSTECTOMY    . ESOPHAGEAL DILATION     several times by Dr. [name]  . ESOPHAGOGASTRODUODENOSCOPY (EGD) WITH ESOPHAGEAL DILATION N/A 08/01/2012   Procedure: ESOPHAGOGASTRODUODENOSCOPY (EGD) WITH ESOPHAGEAL DILATION;  Surgeon: [name], MD;  [facility];  Service: Endoscopy;  Laterality: N/A;  with c-arm savory dilators    . ESOPHAGOGASTRODUODENOSCOPY (EGD) WITH PROPOFOL N/A 03/12/2015   Procedure: ESOPHAGOGASTRODUODENOSCOPY (EGD) WITH PROPOFOL;  Surgeon: [name], MD;  [facility];  Service: Endoscopy;  Laterality: N/A;  . FLEXIBLE SIGMOIDOSCOPY N/A 05/24/2013   Procedure: FLEXIBLE SIGMOIDOSCOPY;  Surgeon: [name], MD;  [facility];  Service: Endoscopy;  Laterality: N/A;  . FLEXIBLE SIGMOIDOSCOPY N/A 05/26/2013   Procedure:  flex with decompression of sigmoid volvulus;  Surgeon: [name], MD;  [facility];  Service: Endoscopy;  Laterality: N/A;  . FLEXIBLE SIGMOIDOSCOPY N/A 12/20/2015   Procedure: FLEXIBLE SIGMOIDOSCOPY;  Surgeon: Milus Banister, MD;  [facility];  Service: Endoscopy;  Laterality: N/A;  . FLEXIBLE SIGMOIDOSCOPY N/A 04/17/2016   Procedure: FLEXIBLE SIGMOIDOSCOPY;  Surgeon: Ladene Artist, MD;  [facility];  Service: Endoscopy;  Laterality: N/A;  . FLEXIBLE SIGMOIDOSCOPY N/A 04/18/2016   Procedure: FLEXIBLE SIGMOIDOSCOPY;  Surgeon: [name], MD;  Location: [facility];  Service: Endoscopy;  Laterality: N/A;  . FOOT SURGERY     benign tumors from foot  . NOSE SURGERY    . SHOULDER SURGERY  2001   left clavicle excision and acromioplasty  . TOTAL HIP ARTHROPLASTY      Allergies  Allergen Reactions  . Aspirin Other (See Comments)    G.I. Upset only    Outpatient Encounter Medications as of 05/07/2017  Medication Sig  . amLODipine (NORVASC) 2.5 MG tablet Take 2.5 mg by mouth every morning. Hold for BP < 100/60 or HR < 60  . aspirin EC 81 MG tablet Take 81 mg by mouth daily.  . baclofen (LIORESAL) 10 MG tablet Take 5 mg by mouth at bedtime.  . benzocaine-menthol (CHLORASEPTIC) 6-10 MG lozenge Take 1 lozenge by mouth as needed for sore throat.  . carvedilol (COREG) 3.125 MG tablet Take 3.125 mg by mouth daily.  . Cranberry 475 MG CAPS Take 1 capsule by mouth 2 (two) times daily.   Marland Kitchen docusate sodium (COLACE) 100  MG capsule Take 200 mg by mouth at bedtime.   . dorzolamide (TRUSOPT) 2 % ophthalmic solution Place 1 drop into both eyes 3 (three) times daily.  Marland Kitchen escitalopram (LEXAPRO) 10 MG tablet Take 10 mg by mouth daily.  . famotidine (PEPCID) 20 MG tablet Take 20 mg by mouth at bedtime.  . ferrous sulfate 220 (44 Fe) MG/5ML solution Give 5 ml by mouth in the evening for supplement  . fluticasone (FLONASE) 50 MCG/ACT nasal spray Place 2 sprays into both nostrils at bedtime.  . gabapentin (NEURONTIN) 100 MG capsule Take 100 mg by mouth at bedtime.  Marland Kitchen HYDROcodone-acetaminophen (NORCO/VICODIN) 5-325 MG tablet Give 0.5 mg tablet by mouth once daily for pain.  Marland Kitchen HYDROcodone-acetaminophen (NORCO/VICODIN) 5-325 MG tablet Take 0.5 tablets by mouth every 8 (eight) hours as needed for moderate pain or severe pain.  . Lidocaine 4 % PTCH Apply topically. Apply to Left hip topically at bedtime.  On for 12 hours then remove  . Linaclotide (LINZESS) 145 MCG CAPS capsule Take 145 mcg by mouth daily before breakfast.   . loratadine (CLARITIN) 10 MG tablet Take 10 mg by mouth daily before breakfast.   . Multiple Vitamins-Minerals (CERTAGEN PO) Take 1 tablet by mouth daily.   . Multiple Vitamins-Minerals (HAIR SKIN AND NAILS FORMULA) TABS Take by mouth. Give 2 tablets by mouth daily  . omeprazole (PRILOSEC) 20 MG capsule Take 20 mg by mouth daily.  . ondansetron (ZOFRAN) 8 MG tablet Take 8 mg by mouth every 8 (eight) hours as needed for nausea or vomiting.  Marland Kitchen Phenazopyridine HCl (AZO TABS PO) Take 1 tablet by mouth at bedtime.  . polyethylene glycol (MIRALAX / GLYCOLAX) packet Take 17 g by mouth daily at 6 (six) AM.   . Potassium Chloride ER 20 MEQ TBCR Give 40 meq by mouth every morning and 20 meq by mouth in the afternoon  . senna (SENOKOT) 8.6 MG TABS tablet Take 1 tablet (8.6 mg total) by mouth 2 (two) times daily.  . sodium chloride (OCEAN) 0.65 % SOLN nasal spray Place 1 spray into both nostrils as directed. Four times  daily And every 24 hours as needed to moisturize nasal passages.  . torsemide (DEMADEX) 20 MG tablet Take 20 mg by mouth daily.  . Travoprost, BAK Free, (TRAVATAN) 0.004 % SOLN ophthalmic solution Place 1 drop into both eyes daily at 8 pm.   . vitamin  B-12 (CYANOCOBALAMIN) 500 MCG tablet Take 500 mcg by mouth every evening.    No facility-administered encounter medications on file as of 05/07/2017.     Review of Systems  Unable to perform ROS: Dementia    Immunization History  Administered Date(s) Administered  . Influenza-Unspecified 07/13/2015, 03/09/2016, 04/02/2017  . PPD Test 07/28/2013, 02/18/2016, 02/25/2016   Pertinent  Health Maintenance Due  Topic Date Due  . URINE MICROALBUMIN  08/10/2017 (Originally 10/08/1934)  . HEMOGLOBIN A1C  10/17/2017 (Originally 10/16/2016)  . FOOT EXAM  01/10/2018 (Originally 01/06/2017)  . PNA vac Low Risk Adult (1 of 2 - PCV13) 11/18/2021 (Originally 10/09/1991)  . OPHTHALMOLOGY EXAM  09/18/2017  . INFLUENZA VACCINE  Completed  . DEXA SCAN  Discontinued   Fall Risk  11/23/2016  Falls in the past year? No    Vitals:   05/07/17 1018  BP: 136/84  Pulse: 84  Resp: 18  Temp: 98.1 F (36.7 C)  SpO2: 97%  Weight: 150 lb 3.2 oz (68.1 kg)  Height: 5\' 7"  (1.702 m)   Body mass index is 23.52 kg/m. Physical Exam  Constitutional: She appears well-developed.  Frail appearing in NAD, sitting in w/c  HENT:  Mouth/Throat: Oropharynx is clear and moist. No oropharyngeal exudate.  MMM; no oral thrush. Oropharynx with cobblestoning appearance but no redness; L>R maxillary sinus boggy tissue texture changes but no TTP today  Eyes: Pupils are equal, round, and reactive to light. No scleral icterus.  Neck: Neck supple. Muscular tenderness present. Carotid bruit is not present. Decreased range of motion present. No tracheal deviation present.    Cardiovascular: Normal rate, regular rhythm and intact distal pulses. Exam reveals no gallop and no friction rub.   Murmur (1/6 SEM) heard. R>LLE edema +1 pitting. No calf TTP b/l. B/l varicose veins and spider veins noted  Pulmonary/Chest: Effort normal and breath sounds normal. No stridor. No respiratory distress. She has no wheezes. She has no rales.  Abdominal: Normal appearance and bowel sounds are normal. She exhibits distension. She exhibits no mass. There is no hepatomegaly. There is no tenderness. There is no rigidity, no rebound and no guarding. No hernia.  Musculoskeletal: She exhibits edema (right knee with reduced ROM) and tenderness (L>R TMJ with swelling).  Lymphadenopathy:    She has no cervical adenopathy.  Neurological: She is alert.  Min reduced right grip strength but no new focal deficits; CN 2-12 grossly intact  Skin: Skin is warm and dry. No rash noted.  Psychiatric: She has a normal mood and affect. Her behavior is normal. Thought content normal.    Labs reviewed: Recent Labs    11/24/16 01/30/17 02/07/17 03/26/17  NA 144 139  --  140  K 3.8 3.8 4.6 4.8  BUN 17 25*  --  26*  CREATININE 1.2* 1.5*  --  1.6*   Recent Labs    08/12/16  AST 12*  ALT 7  ALKPHOS 90   Recent Labs    11/24/16 01/30/17 03/26/17  WBC 3.4 4.4 4.4  NEUTROABS  --  2 2  HGB 9.9* 9.9* 9.8*  HCT 30* 29* 28*  PLT 98* 91* 102*   Lab Results  Component Value Date   TSH 2.572 04/18/2016   Lab Results  Component Value Date   HGBA1C 6.9 (H) 04/18/2016   Lab Results  Component Value Date   CHOL 133 11/24/2016   HDL 41 11/24/2016   LDLCALC 78 11/24/2016   TRIG 75 11/24/2016   CHOLHDL 2.3 05/23/2011  Significant Diagnostic Results in last 30 days:  No results found.  Assessment/Plan   ICD-10-CM   1. Chronic intractable headache, unspecified headache type R51   2. TMJ (temporomandibular joint syndrome) M26.609   3. Allergic rhinitis, unspecified seasonality, unspecified trigger J30.9   4. Primary osteoarthritis involving multiple joints M15.0   5. Dementia associated with other  underlying disease without behavioral disturbance F02.80   6. Major depressive disorder with single episode, in full remission ([place]) F32.5   7. Volvulus of sigmoid colon (HCC) K56.2   8. Decreased vision of right eye H54.7    Check CT head wo contrast to further w/u HA  May need neurology eval but await head CT results  Cont current meds as ordered  PT/OT/ST as indicated  Will follow   Family/ staff Communication: [name], daughter, at bedside and pt  Labs/tests ordered: CT head wo contrast, CBC w diff, CMP   Iker Nuttall S. Perlie Gold  [facility] [address] [place], [place] [address] [phone] Cell (Monday-Friday 8 AM - 5 PM) [phone] After 5 PM and follow prompts

## 2017-05-07 NOTE — Progress Notes (Deleted)
Patient ID: Maria Christian, female   DOB: [birth year], 83 y.o.   MRN: [id number]     DATE:  May 07, 2017  Location:   [facility] Room Number: 258 A Place of Service: SNF (31)   Extended Emergency Contact Information Primary Emergency Contact: [place] of [place] Phone: [phone] Relation: Son Secondary Emergency Contact: Roberts,Lynn Address: [address]          [place], [place] [address] [place] of [place] Phone: [phone] Mobile Phone: [phone] Relation: Daughter  Advanced Directive information Does Patient Have a Medical Advance Directive?: Yes, Type of Advance Directive: Out of facility DNR (pink MOST or yellow form), Pre-existing out of facility DNR order (yellow form or pink MOST form): Yellow form placed in chart (order not valid for inpatient use), Does patient want to make changes to medical advance directive?: No - Patient declined  Chief Complaint  Patient presents with  . Acute Visit    Family consult - headaches    HPI:  ***  Past Medical History:  Diagnosis Date  . Altered mental status   . Anemia   . Atrial fibrillation ([place])   . CKD (chronic kidney disease) stage 3, GFR 30-59 ml/min (HCC) 02/27/2015  . Constipation 02/27/2015  . Coronary artery disease   . Dementia   . Depression   . Diabetes mellitus   . Edema of lower extremity 07/13/11   right leg more swollen than left leg  . Encephalopathy   . Enlarged heart   . Esophageal dysmotility 07/02/12  . GERD (gastroesophageal reflux disease)   . History of adenomatous polyp of colon 06/24/99  . Horseshoe kidney   . Hyperlipemia   . Hypertension   . Pancreatic lesion 05/22/11   no further workup  per PCP/family due to age  . Parkinson disease ([place])   . Peripheral neuropathy   . Sigmoid volvulus ([place]) 05/24/2013  . Thrombophlebitis   . TIA (transient ischemic attack) 12/19/2012    Past Surgical History:  Procedure Laterality Date  .  ABDOMINAL HYSTERECTOMY    . BOWEL DECOMPRESSION N/A 04/18/2016   Procedure: BOWEL DECOMPRESSION;  Surgeon: [name], MD;  [facility];  Service: Endoscopy;  Laterality: N/A;  . BREAST SURGERY     2 benign tumors removed left breast  . CHOLECYSTECTOMY    . ESOPHAGEAL DILATION     several times by Dr. [name]  . ESOPHAGOGASTRODUODENOSCOPY (EGD) WITH ESOPHAGEAL DILATION N/A 08/01/2012   Procedure: ESOPHAGOGASTRODUODENOSCOPY (EGD) WITH ESOPHAGEAL DILATION;  Surgeon: [name], MD;  [facility];  Service: Endoscopy;  Laterality: N/A;  with c-arm savory dilators  . ESOPHAGOGASTRODUODENOSCOPY (EGD) WITH PROPOFOL N/A 03/12/2015   Procedure: ESOPHAGOGASTRODUODENOSCOPY (EGD) WITH PROPOFOL;  Surgeon: [name], MD;  [facility];  Service: Endoscopy;  Laterality: N/A;  . FLEXIBLE SIGMOIDOSCOPY N/A 05/24/2013   Procedure: FLEXIBLE SIGMOIDOSCOPY;  Surgeon: [name], MD;  [facility];  Service: Endoscopy;  Laterality: N/A;  . FLEXIBLE SIGMOIDOSCOPY N/A 05/26/2013   Procedure:  flex with decompression of sigmoid volvulus;  Surgeon: [name], MD;  [facility];  Service: Endoscopy;  Laterality: N/A;  . FLEXIBLE SIGMOIDOSCOPY N/A 12/20/2015   Procedure: FLEXIBLE SIGMOIDOSCOPY;  Surgeon: Milus Banister, MD;  [facility];  Service: Endoscopy;  Laterality: N/A;  . FLEXIBLE SIGMOIDOSCOPY N/A 04/17/2016   Procedure: FLEXIBLE SIGMOIDOSCOPY;  Surgeon: Ladene Artist, MD;  [facility];  Service: Endoscopy;  Laterality: N/A;  .  FLEXIBLE SIGMOIDOSCOPY N/A 04/18/2016   Procedure: FLEXIBLE SIGMOIDOSCOPY;  Surgeon: [name], MD;  [facility];  Service: Endoscopy;  Laterality: N/A;  . FOOT SURGERY     benign tumors from foot  . NOSE SURGERY    . SHOULDER SURGERY  2001   left clavicle excision and acromioplasty  . TOTAL HIP ARTHROPLASTY      Patient Care Team: [name], DO as PCP - General  (Internal Medicine) Nyoka Cowden Phylis Bougie, NP as Nurse Practitioner (Nurse Practitioner)  Social History   Socioeconomic History  . Marital status: Widowed    Spouse name: Not on file  . Number of children: 5  . Years of education: 10th  . Highest education level: Not on file  Social Needs  . Financial resource strain: Not on file  . Food insecurity - worry: Not on file  . Food insecurity - inability: Not on file  . Transportation needs - medical: Not on file  . Transportation needs - non-medical: Not on file  Occupational History  . Occupation: Retired  Tobacco Use  . Smoking status: Never Smoker  . Smokeless tobacco: Never Used  Substance and Sexual Activity  . Alcohol use: No  . Drug use: No  . Sexual activity: No  Other Topics Concern  . Not on file  Social History Narrative   Pt lives at [facility]    Caffeine Use: very small amount daily     reports that  has never smoked. she has never used smokeless tobacco. She reports that she does not drink alcohol or use drugs.  Family History  Problem Relation Age of Onset  . Cancer Unknown   . Heart disease Unknown    Family Status  Relation Name Status  . Mother  Deceased at age 76       blood clots  . Father  Deceased at age 78       cirrohis of the liver  . Unknown  (Not Specified)    Immunization History  Administered Date(s) Administered  . Influenza-Unspecified 07/13/2015, 03/09/2016, 04/02/2017  . PPD Test 07/28/2013, 02/18/2016, 02/25/2016    Allergies  Allergen Reactions  . Aspirin Other (See Comments)    G.I. Upset only    Medications:   Medication List        Accurate as of 05/07/17 10:26 AM. Always use your most recent med list.          amLODipine 2.5 MG tablet Commonly known as:  NORVASC   aspirin EC 81 MG tablet   AZO TABS PO   baclofen 10 MG tablet Commonly known as:  LIORESAL   carvedilol 3.125 MG tablet Commonly known as:  COREG   * HAIR SKIN AND NAILS FORMULA Tabs   *  CERTAGEN PO   CHLORASEPTIC 6-10 MG lozenge Generic drug:  benzocaine-menthol   Cranberry 475 MG Caps   docusate sodium 100 MG capsule Commonly known as:  COLACE   dorzolamide 2 % ophthalmic solution Commonly known as:  TRUSOPT   escitalopram 10 MG tablet Commonly known as:  LEXAPRO   famotidine 20 MG tablet Commonly known as:  PEPCID   ferrous sulfate 220 (44 Fe) MG/5ML solution   fluticasone 50 MCG/ACT nasal spray Commonly known as:  FLONASE   gabapentin 100 MG capsule Commonly known as:  NEURONTIN   * HYDROcodone-acetaminophen 5-325 MG tablet Commonly known as:  NORCO/VICODIN   * HYDROcodone-acetaminophen 5-325 MG tablet Commonly known as:  NORCO/VICODIN Give 0.5 mg tablet by mouth  once daily for pain.   Lidocaine 4 % Ptch   LINZESS 145 MCG Caps capsule Generic drug:  linaclotide   loratadine 10 MG tablet Commonly known as:  CLARITIN   omeprazole 20 MG capsule Commonly known as:  PRILOSEC   ondansetron 8 MG tablet Commonly known as:  ZOFRAN   polyethylene glycol packet Commonly known as:  MIRALAX / GLYCOLAX   Potassium Chloride ER 20 MEQ Tbcr   senna 8.6 MG Tabs tablet Commonly known as:  SENOKOT Take 1 tablet (8.6 mg total) by mouth 2 (two) times daily.   sodium chloride 0.65 % Soln nasal spray Commonly known as:  OCEAN   torsemide 20 MG tablet Commonly known as:  DEMADEX   Travoprost (BAK Free) 0.004 % Soln ophthalmic solution Commonly known as:  TRAVATAN   vitamin B-12 500 MCG tablet Commonly known as:  CYANOCOBALAMIN      * This list has 4 medication(s) that are the same as other medications prescribed for you. Read the directions carefully, and ask your doctor or other care provider to review them with you.          Review of Systems  Vitals:   05/07/17 1018  BP: 136/84  Pulse: 84  Resp: 18  Temp: 98.1 F (36.7 C)  SpO2: 97%  Weight: 150 lb 3.2 oz (68.1 kg)  Height: 5\' 7"  (1.702 m)   Body mass index is 23.52  kg/m.  Physical Exam   Labs reviewed: Nursing Home on 03/26/2017  Component Date Value Ref Range Status  . Hemoglobin 03/26/2017 9.8* 12.0 - 16.0 Final  . HCT 03/26/2017 28* 36 - 46 Final  . Neutrophils Absolute 03/26/2017 2   Final  . Platelets 03/26/2017 102* 150 - 399 Final  . WBC 03/26/2017 4.4   Final  . Glucose 03/26/2017 174   Final  . BUN 03/26/2017 26* 4 - 21 Final  . Creatinine 03/26/2017 1.6* 0.5 - 1.1 Final  . Potassium 03/26/2017 4.8  3.4 - 5.3 Final  . Sodium 03/26/2017 140  137 - 147 Final  Nursing Home on 03/15/2017  Component Date Value Ref Range Status  . Vit D, 25-Hydroxy 02/21/2017 45.39   Final  . Vitamin B-12 02/21/2017 685   Final  Abstract on 02/09/2017  Component Date Value Ref Range Status  . Potassium 02/07/2017 4.6  3.4 - 5.3 Final    No results found.   Assessment/Plan    Monica S. Perlie Gold  [facility] and Adult [facility] [address] [place], [place] [address] [phone] Cell (Monday-Friday 8 AM - 5 PM) [phone] After 5 PM and follow prompts

## 2017-05-08 LAB — HEPATIC FUNCTION PANEL
ALK PHOS: 104 (ref 25–125)
ALT: 6 — AB (ref 7–35)
ALT: 6 — AB (ref 7–35)
AST: 12 — AB (ref 13–35)
AST: 12 — AB (ref 13–35)
Alkaline Phosphatase: 104 (ref 25–125)
Bilirubin, Total: 0.3
Bilirubin, Total: 0.3

## 2017-05-08 LAB — BASIC METABOLIC PANEL
BUN: 27 — AB (ref 4–21)
BUN: 27 — AB (ref 4–21)
CREATININE: 1.4 — AB (ref 0.5–1.1)
Creatinine: 1.4 — AB (ref 0.5–1.1)
Glucose: 117
Glucose: 117
POTASSIUM: 4.6 (ref 3.4–5.3)
POTASSIUM: 4.6 (ref 3.4–5.3)
SODIUM: 144 (ref 137–147)
Sodium: 144 (ref 137–147)

## 2017-05-08 LAB — CBC AND DIFFERENTIAL
HCT: 29 — AB (ref 36–46)
HEMOGLOBIN: 9.4 — AB (ref 12.0–16.0)
NEUTROS ABS: 2
Platelets: 97 — AB (ref 150–399)
WBC: 4.2

## 2017-05-09 ENCOUNTER — Other Ambulatory Visit: Payer: Self-pay

## 2017-05-09 ENCOUNTER — Non-Acute Institutional Stay (SKILLED_NURSING_FACILITY): Payer: Medicare Other | Admitting: Adult Health

## 2017-05-09 ENCOUNTER — Encounter: Payer: Self-pay | Admitting: Adult Health

## 2017-05-09 ENCOUNTER — Emergency Department (HOSPITAL_BASED_OUTPATIENT_CLINIC_OR_DEPARTMENT_OTHER)
Admit: 2017-05-09 | Discharge: 2017-05-09 | Disposition: A | Discharge status: Home / Self Care | Payer: Medicare Other | Attending: Emergency Medicine | Admitting: Emergency Medicine

## 2017-05-09 ENCOUNTER — Emergency Department (HOSPITAL_COMMUNITY): Payer: Medicare Other

## 2017-05-09 ENCOUNTER — Emergency Department (HOSPITAL_COMMUNITY)
Admission: EM | Admit: 2017-05-09 | Discharge: 2017-05-09 | Disposition: A | Discharge status: Home / Self Care | Payer: Medicare Other | Attending: Emergency Medicine | Admitting: Emergency Medicine

## 2017-05-09 ENCOUNTER — Encounter (HOSPITAL_COMMUNITY): Payer: Self-pay

## 2017-05-09 DIAGNOSIS — R1084 Generalized abdominal pain: Secondary | ICD-10-CM | POA: Diagnosis not present

## 2017-05-09 DIAGNOSIS — N3000 Acute cystitis without hematuria: Secondary | ICD-10-CM

## 2017-05-09 DIAGNOSIS — I2510 Atherosclerotic heart disease of native coronary artery without angina pectoris: Secondary | ICD-10-CM | POA: Insufficient documentation

## 2017-05-09 DIAGNOSIS — M79609 Pain in unspecified limb: Secondary | ICD-10-CM

## 2017-05-09 DIAGNOSIS — E1122 Type 2 diabetes mellitus with diabetic chronic kidney disease: Secondary | ICD-10-CM | POA: Diagnosis not present

## 2017-05-09 DIAGNOSIS — M7989 Other specified soft tissue disorders: Secondary | ICD-10-CM

## 2017-05-09 DIAGNOSIS — R140 Abdominal distension (gaseous): Secondary | ICD-10-CM | POA: Diagnosis not present

## 2017-05-09 DIAGNOSIS — I130 Hypertensive heart and chronic kidney disease with heart failure and stage 1 through stage 4 chronic kidney disease, or unspecified chronic kidney disease: Secondary | ICD-10-CM | POA: Insufficient documentation

## 2017-05-09 DIAGNOSIS — K567 Ileus, unspecified: Secondary | ICD-10-CM | POA: Diagnosis not present

## 2017-05-09 DIAGNOSIS — R9431 Abnormal electrocardiogram [ECG] [EKG]: Secondary | ICD-10-CM | POA: Diagnosis not present

## 2017-05-09 DIAGNOSIS — K562 Volvulus: Secondary | ICD-10-CM

## 2017-05-09 DIAGNOSIS — K6389 Other specified diseases of intestine: Secondary | ICD-10-CM | POA: Diagnosis not present

## 2017-05-09 DIAGNOSIS — Z86718 Personal history of other venous thrombosis and embolism: Secondary | ICD-10-CM

## 2017-05-09 DIAGNOSIS — R51 Headache: Secondary | ICD-10-CM | POA: Diagnosis not present

## 2017-05-09 DIAGNOSIS — Z79899 Other long term (current) drug therapy: Secondary | ICD-10-CM | POA: Insufficient documentation

## 2017-05-09 DIAGNOSIS — Z7982 Long term (current) use of aspirin: Secondary | ICD-10-CM | POA: Insufficient documentation

## 2017-05-09 DIAGNOSIS — N183 Chronic kidney disease, stage 3 (moderate): Secondary | ICD-10-CM | POA: Diagnosis not present

## 2017-05-09 DIAGNOSIS — I5030 Unspecified diastolic (congestive) heart failure: Secondary | ICD-10-CM | POA: Insufficient documentation

## 2017-05-09 DIAGNOSIS — M79604 Pain in right leg: Secondary | ICD-10-CM | POA: Diagnosis not present

## 2017-05-09 DIAGNOSIS — Z8673 Personal history of transient ischemic attack (TIA), and cerebral infarction without residual deficits: Secondary | ICD-10-CM | POA: Insufficient documentation

## 2017-05-09 DIAGNOSIS — F0390 Unspecified dementia without behavioral disturbance: Secondary | ICD-10-CM | POA: Insufficient documentation

## 2017-05-09 LAB — URINALYSIS, ROUTINE W REFLEX MICROSCOPIC
BILIRUBIN URINE: NEGATIVE
Glucose, UA: NEGATIVE mg/dL
KETONES UR: NEGATIVE mg/dL
Nitrite: POSITIVE — AB
PROTEIN: NEGATIVE mg/dL
SPECIFIC GRAVITY, URINE: 1.009 (ref 1.005–1.030)
pH: 5 (ref 5.0–8.0)

## 2017-05-09 LAB — COMPREHENSIVE METABOLIC PANEL
ALBUMIN: 3.6 g/dL (ref 3.5–5.0)
ALK PHOS: 93 U/L (ref 38–126)
ALT: 11 U/L — ABNORMAL LOW (ref 14–54)
AST: 19 U/L (ref 15–41)
Anion gap: 4 — ABNORMAL LOW (ref 5–15)
BILIRUBIN TOTAL: 0.6 mg/dL (ref 0.3–1.2)
BUN: 25 mg/dL — AB (ref 6–20)
CO2: 22 mmol/L (ref 22–32)
Calcium: 8.8 mg/dL — ABNORMAL LOW (ref 8.9–10.3)
Chloride: 113 mmol/L — ABNORMAL HIGH (ref 101–111)
Creatinine, Ser: 1.51 mg/dL — ABNORMAL HIGH (ref 0.44–1.00)
GFR calc Af Amer: 34 mL/min — ABNORMAL LOW (ref >60)
GFR calc non Af Amer: 29 mL/min — ABNORMAL LOW (ref >60)
GLUCOSE: 133 mg/dL — AB (ref 65–99)
POTASSIUM: 4.1 mmol/L (ref 3.5–5.1)
Sodium: 139 mmol/L (ref 135–145)
TOTAL PROTEIN: 6.9 g/dL (ref 6.5–8.1)

## 2017-05-09 LAB — LIPASE, BLOOD: Lipase: 24 U/L (ref 11–51)

## 2017-05-09 LAB — CBC
HEMATOCRIT: 29.4 % — AB (ref 36.0–46.0)
HEMOGLOBIN: 9.5 g/dL — AB (ref 12.0–15.0)
MCH: 34.2 pg — ABNORMAL HIGH (ref 26.0–34.0)
MCHC: 32.3 g/dL (ref 30.0–36.0)
MCV: 105.8 fL — ABNORMAL HIGH (ref 78.0–100.0)
Platelets: 95 10*3/uL — ABNORMAL LOW (ref 150–400)
RBC: 2.78 MIL/uL — ABNORMAL LOW (ref 3.87–5.11)
RDW: 14.3 % (ref 11.5–15.5)
WBC: 4.8 10*3/uL (ref 4.0–10.5)

## 2017-05-09 LAB — I-STAT TROPONIN, ED: TROPONIN I, POC: 0.01 ng/mL (ref 0.00–0.08)

## 2017-05-09 LAB — BRAIN NATRIURETIC PEPTIDE: B Natriuretic Peptide: 429.7 pg/mL — ABNORMAL HIGH (ref 0.0–100.0)

## 2017-05-09 LAB — POC OCCULT BLOOD, ED: Fecal Occult Bld: NEGATIVE

## 2017-05-09 MED ORDER — FOSFOMYCIN TROMETHAMINE 3 G PO PACK
3.0000 g | PACK | Freq: Once | ORAL | Status: AC
  Administered 2017-05-09: 3 g via ORAL
  Filled 2017-05-09: qty 3

## 2017-05-09 MED ORDER — FUROSEMIDE 10 MG/ML IJ SOLN
20.0000 mg | Freq: Once | INTRAMUSCULAR | Status: AC
  Administered 2017-05-09: 20 mg via INTRAVENOUS
  Filled 2017-05-09: qty 4

## 2017-05-09 NOTE — Progress Notes (Signed)
Right lower extremity venous duplex completed. No evidence of DVT, superficial thrombosis, or Baker's cyst. Toma Copier, RVS 05/09/2017 7:14 PM

## 2017-05-09 NOTE — ED Notes (Signed)
Pt is alert and oriented x 4 and is verbally responsive pt was sent her by [facility]  SNF as a result of a abdominal xray that showed " Marked colonic ileus or obstruction. Pt denies pain at this time and pt dtr is at bedside.

## 2017-05-09 NOTE — ED Provider Notes (Signed)
[facility] DEPT Provider Note   CSN: [id number] Arrival date & time: 05/09/17  1505     History   Chief Complaint Chief Complaint  Patient presents with  . possible ileus/obstruction    HPI Maria Christian is a 83 y.o. female with a history of dementia, Sigmoid volvulus, DM Type II, Parkinson Disease,  Afib, DVT, Pancreatic lesion, CHF, and CKD stage III who presents to the emergency department with her daughter from [facility] with a chief complaint of altered mental status that worsened yesterday.  She was evaluated by the nurse practitioner at [facility] who performed a KUB which demonstrated a marked colonic ileus or obstruction and a chest x-ray with modest cardiomegaly with mild CHF.  She also complains of worsening swelling and pain to her right.  No new falls or injuries.  She is ambulatory in a wheelchair at baseline.  Her daughter reports a remote history of a DVT that required hospitalization.  She is not currently anticoagulated.  Level 5 caveat secondary to dementia.  The history is provided by the patient and a relative. No language interpreter was used.    Past Medical History:  Diagnosis Date  . Altered mental status   . Anemia   . Atrial fibrillation ([place])   . CKD (chronic kidney disease) stage 3, GFR 30-59 ml/min (HCC) 02/27/2015  . Constipation 02/27/2015  . Coronary artery disease   . Dementia   . Depression   . Diabetes mellitus   . Edema of lower extremity 07/13/11   right leg more swollen than left leg  . Encephalopathy   . Enlarged heart   . Esophageal dysmotility 07/02/12  . GERD (gastroesophageal reflux disease)   . History of adenomatous polyp of colon 06/24/99  . Horseshoe kidney   . Hyperlipemia   . Hypertension   . Pancreatic lesion 05/22/11   no further workup  per PCP/family due to age  . Parkinson disease ([place])   . Peripheral neuropathy   . Sigmoid volvulus ([place]) 05/24/2013  .  Thrombophlebitis   . TIA (transient ischemic attack) 12/19/2012    Patient Active Problem List   Diagnosis Date Noted  . Acute bronchitis 04/16/2017  . Abnormal weight loss 03/02/2017  . TMJ (temporomandibular joint syndrome) 03/02/2017  . Shingles 12/11/2016  . Dilatation of colon   . GERD (gastroesophageal reflux disease) 01/04/2016  . DM (diabetes mellitus), type 2 ([place]) 01/04/2016  . Hypertensive heart disease with congestive heart failure ([place]) 12/02/2015  . Vitamin B12 deficiency 09/27/2015  . Chronic respiratory failure ([place]) 03/16/2015  . Allergic rhinitis 03/16/2015  . Chronic pain 03/16/2015  . Osteoarthritis of multiple joints 03/16/2015  . Duodenal ulcer 03/12/2015  . Headache 03/09/2015  . Cephalgia 03/01/2015  . Constipation 02/27/2015  . Anemia of chronic disease 02/27/2015  . Chronic atrial fibrillation ([place]) 02/27/2015  . Hypokalemia 02/27/2015  . CKD (chronic kidney disease) stage 3, GFR 30-59 ml/min (HCC) 02/27/2015  . Ileus ([place]) 02/26/2015  . Malnutrition of moderate degree ([place]) 10/10/2013  . FTT (failure to thrive) in adult 07/29/2013  . Volvulus of sigmoid colon ([place]) 05/26/2013  . Dementia without behavioral disturbance 12/19/2012  . Depression 12/19/2012  . Coronary artery disease   . Diastolic CHF, chronic ([place]) 05/24/2011    Past Surgical History:  Procedure Laterality Date  . ABDOMINAL HYSTERECTOMY    . BOWEL DECOMPRESSION N/A 04/18/2016   Procedure: BOWEL DECOMPRESSION;  Surgeon: [name], MD;  Location: [facility] ENDOSCOPY;  Service: Endoscopy;  Laterality: N/A;  . BREAST SURGERY     2 benign tumors removed left breast  . CHOLECYSTECTOMY    . ESOPHAGEAL DILATION     several times by Dr. [name]  . ESOPHAGOGASTRODUODENOSCOPY (EGD) WITH ESOPHAGEAL DILATION N/A 08/01/2012   Procedure: ESOPHAGOGASTRODUODENOSCOPY (EGD) WITH ESOPHAGEAL DILATION;  Surgeon: [name], MD;  Location: [facility];  Service: Endoscopy;  Laterality: N/A;  with  c-arm savory dilators  . ESOPHAGOGASTRODUODENOSCOPY (EGD) WITH PROPOFOL N/A 03/12/2015   Procedure: ESOPHAGOGASTRODUODENOSCOPY (EGD) WITH PROPOFOL;  Surgeon: [name], MD;  Location: [facility];  Service: Endoscopy;  Laterality: N/A;  . FLEXIBLE SIGMOIDOSCOPY N/A 05/24/2013   Procedure: FLEXIBLE SIGMOIDOSCOPY;  Surgeon: [name], MD;  Location: [facility];  Service: Endoscopy;  Laterality: N/A;  . FLEXIBLE SIGMOIDOSCOPY N/A 05/26/2013   Procedure:  flex with decompression of sigmoid volvulus;  Surgeon: [name], MD;  Location: [facility];  Service: Endoscopy;  Laterality: N/A;  . FLEXIBLE SIGMOIDOSCOPY N/A 12/20/2015   Procedure: FLEXIBLE SIGMOIDOSCOPY;  Surgeon: Milus Banister, MD;  Location: [facility];  Service: Endoscopy;  Laterality: N/A;  . FLEXIBLE SIGMOIDOSCOPY N/A 04/17/2016   Procedure: FLEXIBLE SIGMOIDOSCOPY;  Surgeon: Ladene Artist, MD;  Location: [facility];  Service: Endoscopy;  Laterality: N/A;  . FLEXIBLE SIGMOIDOSCOPY N/A 04/18/2016   Procedure: FLEXIBLE SIGMOIDOSCOPY;  Surgeon: [name], MD;  Location: [facility];  Service: Endoscopy;  Laterality: N/A;  . FOOT SURGERY     benign tumors from foot  . NOSE SURGERY    . SHOULDER SURGERY  2001   left clavicle excision and acromioplasty  . TOTAL HIP ARTHROPLASTY      OB History    No data available       Home Medications    Prior to Admission medications   Medication Sig Start Date End Date Taking? Authorizing Provider  amLODipine (NORVASC) 2.5 MG tablet Take 2.5 mg by mouth every morning. Hold for BP < 100/60 or HR < 60   Yes [provider]  aspirin EC 81 MG tablet Take 81 mg by mouth daily.   Yes [provider]  baclofen (LIORESAL) 10 MG tablet Take 5 mg by mouth at bedtime.   Yes [provider]  carvedilol (COREG) 3.125 MG tablet Take 3.125 mg by mouth daily.   Yes [provider]  Cranberry 475 MG CAPS Take 1 capsule by mouth 2 (two) times  daily.    Yes [provider]  docusate sodium (COLACE) 100 MG capsule Take 200 mg by mouth at bedtime.    Yes [provider]  dorzolamide (TRUSOPT) 2 % ophthalmic solution Place 1 drop into both eyes 3 (three) times daily.   Yes [provider]  escitalopram (LEXAPRO) 10 MG tablet Take 10 mg by mouth daily.   Yes [provider]  famotidine (PEPCID) 20 MG tablet Take 20 mg by mouth at bedtime.   Yes [provider]  ferrous sulfate 220 (44 Fe) MG/5ML solution Give 5 ml by mouth in the evening for supplement   Yes [provider]  fluticasone (FLONASE) 50 MCG/ACT nasal spray Place 2 sprays into both nostrils at bedtime.   Yes [provider]  gabapentin (NEURONTIN) 100 MG capsule Take 100 mg by mouth at bedtime.   Yes [provider]  HYDROcodone-acetaminophen (NORCO/VICODIN) 5-325 MG tablet Give 0.5 mg tablet by mouth once daily for pain. 04/05/17  Yes Gerlene Fee, NP  Lidocaine 4 %  PTCH Apply 1 patch topically every 12 (twelve) hours. Apply to Left hip topically at bedtime.  On for 12 hours then remove    Yes [provider]  Linaclotide (LINZESS) 145 MCG CAPS capsule Take 145 mcg by mouth daily before breakfast.    Yes [provider]  loratadine (CLARITIN) 10 MG tablet Take 10 mg by mouth daily before breakfast.    Yes [provider]  Multiple Vitamins-Minerals (CERTAGEN PO) Take 1 tablet by mouth every evening.    Yes [provider]  Multiple Vitamins-Minerals (HAIR SKIN AND NAILS FORMULA) TABS Take by mouth. Give 2 tablets by mouth daily   Yes [provider]  Nutritional Supplements (NUTRITIONAL SUPPLEMENT PO) Med Pass - Give 60cc by mouth two times daily for weight loss   Yes [provider]  omeprazole (PRILOSEC) 20 MG capsule Take 20 mg by mouth daily.   Yes [provider]  Phenazopyridine HCl (AZO TABS PO) Take 1 tablet by mouth at bedtime.   Yes  [provider]  polyethylene glycol (MIRALAX / GLYCOLAX) packet Take 17 g by mouth daily.    Yes [provider]  potassium chloride SA (K-DUR,KLOR-CON) 20 MEQ tablet Take 20-40 mEq by mouth 2 (two) times daily. Give 40 meq (2 tablets) in the morning and Give 20 meq (1 tablet) by mouth in the afternoon. 05/06/17  Yes [provider]  senna (SENOKOT) 8.6 MG TABS tablet Take 1 tablet (8.6 mg total) by mouth 2 (two) times daily. 01/05/14  Yes Barton Dubois, MD  torsemide (DEMADEX) 20 MG tablet Take 20 mg by mouth daily.   Yes [provider]  Travoprost, BAK Free, (TRAVATAN) 0.004 % SOLN ophthalmic solution Place 1 drop into both eyes daily at 8 pm.    Yes [provider]  vitamin B-12 (CYANOCOBALAMIN) 500 MCG tablet Take 500 mcg by mouth every evening.    Yes [provider]  benzocaine-menthol (CHLORASEPTIC) 6-10 MG lozenge Take 1 lozenge by mouth as needed for sore throat.    [provider]  HYDROcodone-acetaminophen (NORCO/VICODIN) 5-325 MG tablet Take 0.5 tablets by mouth every 8 (eight) hours as needed for moderate pain or severe pain.     [provider]  ondansetron (ZOFRAN) 8 MG tablet Take 8 mg by mouth every 8 (eight) hours as needed for nausea or vomiting.    [provider]  Potassium Chloride ER 20 MEQ TBCR Give 40 meq by mouth every morning and 20 meq by mouth in the afternoon    [provider]  sodium chloride (OCEAN) 0.65 % SOLN nasal spray Place 1 spray into both nostrils as directed. Four times daily And every 24 hours as needed to moisturize nasal passages.    [provider]    Family History Family History  Problem Relation Age of Onset  . Cancer Unknown   . Heart disease Unknown     Social History Social History   Tobacco Use  . Smoking status: Never Smoker  . Smokeless tobacco: Never Used  Substance Use Topics  . Alcohol use: No  . Drug use: No     Allergies     Aspirin   Review of Systems Review of Systems  Unable to perform ROS: Dementia  Gastrointestinal: Positive for abdominal distention and abdominal pain.  Musculoskeletal: Positive for arthralgias (right leg) and myalgias (right leg).  Neurological: Positive for headaches.   Physical Exam Updated Vital Signs BP (!) 147/69   Pulse 74  Temp 98.9 F (37.2 C) (Oral)   Resp 15   SpO2 97%   Physical Exam  Constitutional: No distress.  HENT:  Head: Normocephalic and atraumatic.  Eyes: Conjunctivae and EOM are normal. Pupils are equal, round, and reactive to light.  Neck: Neck supple.  Cardiovascular: Normal rate. An irregularly irregular rhythm present. Exam reveals no gallop and no friction rub.  No murmur heard. Pulses:      Radial pulses are 2+ on the right side, and 2+ on the left side.       Dorsalis pedis pulses are 2+ on the right side, and 2+ on the left side.       Posterior tibial pulses are 2+ on the right side, and 2+ on the left side.  Pulmonary/Chest: Effort normal. No respiratory distress.  Mild crackles heard in the bilateral bases.  Abdominal: Soft. She exhibits distension. She exhibits no mass. There is tenderness. There is no rebound and no guarding.  Mild generalized tenderness to palpation over the abdomen.  Bowel sounds are present in all 4 quadrants; high pitches in the RUQ, LLQ, and LUQ and hyperactive in the RLQ.  No rebound or guarding.  No tenderness over McBurney's point.  No CVA tenderness bilaterally.  Neurological: She is alert.  Oriented to person, place, and month. Not to year. Unable to state the president.  5 out of 5 strength of the bilateral upper and lower extremities.  Sensation is intact throughout.  Cranial nerves II through XII are grossly intact.  Skin: Skin is warm. No rash noted.  Psychiatric: Her behavior is normal.  Nursing note and vitals reviewed.    ED Treatments / Results  Labs (all labs ordered are listed, but only abnormal  results are displayed) Labs Reviewed  CBC - Abnormal; Notable for the following components:      Result Value   RBC 2.78 (*)    Hemoglobin 9.5 (*)    HCT 29.4 (*)    MCV 105.8 (*)    MCH 34.2 (*)    Platelets 95 (*)    All other components within normal limits  COMPREHENSIVE METABOLIC PANEL - Abnormal; Notable for the following components:   Chloride 113 (*)    Glucose, Bld 133 (*)    BUN 25 (*)    Creatinine, Ser 1.51 (*)    Calcium 8.8 (*)    ALT 11 (*)    GFR calc non Af Amer 29 (*)    GFR calc Af Amer 34 (*)    Anion gap 4 (*)    All other components within normal limits  URINALYSIS, ROUTINE W REFLEX MICROSCOPIC - Abnormal; Notable for the following components:   Color, Urine AMBER (*)    APPearance HAZY (*)    Hgb urine dipstick SMALL (*)    Nitrite POSITIVE (*)    Leukocytes, UA LARGE (*)    Bacteria, UA MANY (*)    Squamous Epithelial / LPF 0-5 (*)    All other components within normal limits  BRAIN NATRIURETIC PEPTIDE - Abnormal; Notable for the following components:   B Natriuretic Peptide 429.7 (*)    All other components within normal limits  URINE CULTURE  LIPASE, BLOOD  I-STAT TROPONIN, ED  POC OCCULT BLOOD, ED    EKG  EKG Interpretation  Date/Time:  Wednesday May 09 2017 16:58:24 EST Ventricular Rate:  68 PR Interval:    QRS Duration: 90 QT Interval:  452 QTC Calculation: 481 R Axis:   5 Text  Interpretation:  Atrial fibrillation since last tracing no significant change Confirmed by Malvin Johns [phone]) on 05/09/2017 5:26:15 PM       Radiology Ct Abdomen Pelvis Wo Contrast  Result Date: 05/09/2017 CLINICAL DATA:  Colonic ileus or obstruction shown on outside radiograph. Increased confusion EXAM: CT ABDOMEN AND PELVIS WITHOUT CONTRAST TECHNIQUE: Multidetector CT imaging of the abdomen and pelvis was performed following the standard protocol without IV contrast. COMPARISON:  Multiple exams, including 04/17/2016 FINDINGS: Lower chest: Moderate  to prominent cardiomegaly with coronary atherosclerosis and mitral valve calcifications. Trace right pleural effusion. Dense atherosclerotic calcification of the descending thoracic aorta. Airway thickening in both lower lobes. Hepatobiliary: Cholecystectomy.  Otherwise unremarkable. Pancreas: Unremarkable Spleen: Unremarkable Adrenals/Urinary Tract: Adrenal glands normal. Horseshoe kidney. Vascular calcifications along the renal hila, difficult to completely exclude a small left renal calculus on image 46/6. No right ureteral calculus. The left distal ureter is obscured by streak artifact from the patient's left hip implant, but there is no hydronephrosis or hydroureter. The left side of the urinary bladder is likewise obscured. Stomach/Bowel: Redundant sigmoid colon with mild prominence of gas in the sigmoid colon, but no overt sigmoid volvulus. There is some air fluid levels in the descending colon which may reflect diarrheal process. There is some potential mild localized wall thickening in the distal ascending colon on images 37-43 of series 6. No surrounding abnormal lymph nodes. This is probably incidental, but difficult to exclude a tumor at this site. Vascular/Lymphatic: Aortoiliac atherosclerotic vascular disease. Reproductive: Uterus is absent.  No significant adnexal abnormality. Other: No supplemental non-categorized findings. Musculoskeletal: Left hip hemiarthroplasty. Bony demineralization. Lumbar spondylosis and degenerative disc disease. Prominent degenerative chondral thinning in the right hip. IMPRESSION: 1. Gaseous prominence in the distended sigmoid colon as on the prior exam, likely a chronic functional colonic abnormality. 2. Air- fluid levels in the descending colon much raises suspicion for a diarrheal process. 3. Localized questionable wall thickening in the distal ascending colon without surrounding adenopathy. Probably incidental but strictly speaking I cannot exclude a colon tumor.  Correlation with the patient's colon cancer screening history is recommended. If screening is not up-to-date, appropriate screening should be considered. 4. Other imaging findings of potential clinical significance: Moderate to prominent cardiomegaly. Aortic Atherosclerosis (ICD10-I70.0). Trace right pleural effusion. Airway thickening is present, suggesting bronchitis or reactive airways disease. Horseshoe kidney. Bony demineralization. Lumbar spondylosis and degenerative disc disease. Electronically Signed   By: Van Clines M.D.   On: 05/09/2017 19:06   Ct Head Wo Contrast  Result Date: 05/09/2017 CLINICAL DATA:  Chronic headache, history atrial fibrillation, coronary artery disease, dementia, diabetes mellitus, hypertension, encephalopathy, TIA, Parkinson's EXAM: CT HEAD WITHOUT CONTRAST TECHNIQUE: Contiguous axial images were obtained from the base of the skull through the vertex without intravenous contrast. COMPARISON:  02/26/2015 FINDINGS: Brain: Generalized atrophy. Normal ventricular morphology. No midline shift or mass effect. Small vessel chronic ischemic changes of deep cerebral white matter. Old lacunar infarct RIGHT basal ganglia. No intracranial hemorrhage, mass lesion, evidence of acute infarction, or extra-axial fluid collection. Vascular: Atherosclerotic calcification of internal carotid and vertebral arteries at skullbase Skull: Intact Sinuses/Orbits: Clear Other: N/A IMPRESSION: Atrophy with small vessel chronic ischemic changes of deep cerebral white matter. Old lacunar infarct RIGHT basal ganglia. No acute intracranial abnormalities. Electronically Signed   By: Lavonia Dana M.D.   On: 05/09/2017 18:59    Procedures Procedures (including critical care time)  Medications Ordered in ED Medications  furosemide (LASIX) injection 20 mg (20 mg Intravenous Given 05/09/17  2019)  fosfomycin (MONUROL) packet 3 g (3 g Oral Given 05/09/17 2111)     Initial Impression / Assessment and  Plan / ED Course  I have reviewed the triage vital signs and the nursing notes.  Pertinent labs & imaging results that were available during my care of the patient were reviewed by me and considered in my medical decision making (see chart for details).     83 year old female with multiple chronic medical conditions presenting to the emergency department for altered mental status.  She was evaluated by her nursing facility earlier this morning who sent her to the emergency department for further evaluation after abdominal x-ray showed volvulus versus obstruction and chest x-ray was concerning for moderate cardiomegaly with mild CHF. CT A/P not demonstrate volvulus or obstruction; however there is an area of inflammation that a mass cannot be ruled out and correlation with colonoscopy history was recommended.  Previous colonoscopy records are not available through epic, but the patient has good outpatient follow-up.  Urinalysis consistent with UTI and the patient does endorse mild dysuria in addition to confusion.  Will treat her with fosfomycin in the emergency department and send her urine for culture.  She also complains of right leg pain.  She has a history of DVT, and not anticoagulated, and on physical exam, the right leg is approximately at least one third more edematous when compared to the left. Venous duplex negative for DVT. EKG without acute changes.  The remainder of her labs are unremarkable.  Discussed and evaluated the patient with Dr. Tamera Punt, attending physician.  Will discharge the patient back to SNF with follow up regarding her CT A/P results.  Discussed these findings with the patient's daughter who is in agreement with the plan.  Strict return precautions given.  No acute distress.  The patient is safe for discharge at this time.  Final Clinical Impressions(s) / ED Diagnoses   Final diagnoses:  Acute cystitis without hematuria  Generalized abdominal pain  Right leg pain    ED  Discharge Orders    None       Joanne Gavel, PA-C 05/10/17 0012    Malvin Johns, MD 05/11/17 [phone]

## 2017-05-09 NOTE — ED Notes (Signed)
Pt has purewick in place to collect urine sample 

## 2017-05-09 NOTE — Progress Notes (Signed)
Location:   [facility] Room Number: 595 A Place of Service:  SNF (31)   CODE STATUS: Full Code  Allergies  Allergen Reactions  . Aspirin Other (See Comments)    [name]    Chief Complaint  Patient presents with  . Acute Visit    Increased Confusion    HPI:  Starting yesterday she has had an increase in her confusion. She tells me; she cannot understand why she is here. I have spoken with her son [name] and with her daughter [name]. Her kub demonstrates a marked colonic ileus or obstruction with her history of volvulus of sigmoid colon. She will need to be taken to the ED for further treatment options. There are no reports of nausea or vomiting; no fevers present. Her chest x-ray demonstrates modest cardiomegaly with mild chf.   Past Medical History:  Diagnosis Date  . Altered mental status   . Anemia   . Atrial fibrillation ([place])   . CKD (chronic kidney disease) stage 3, GFR 30-59 ml/min (HCC) 02/27/2015  . Constipation 02/27/2015  . Coronary artery disease   . Dementia   . Depression   . Diabetes mellitus   . Edema of lower extremity 07/13/11   right leg more swollen than left leg  . Encephalopathy   . Enlarged heart   . Esophageal dysmotility 07/02/12  . GERD (gastroesophageal reflux disease)   . History of adenomatous polyp of colon 06/24/99  . Horseshoe kidney   . Hyperlipemia   . Hypertension   . Pancreatic lesion 05/22/11   no further workup  per PCP/family due to age  . Parkinson disease ([place])   . Peripheral neuropathy   . Sigmoid volvulus ([place]) 05/24/2013  . Thrombophlebitis   . TIA (transient ischemic attack) 12/19/2012    Past Surgical History:  Procedure Laterality Date  . ABDOMINAL HYSTERECTOMY    . BOWEL DECOMPRESSION N/A 04/18/2016   Procedure: BOWEL DECOMPRESSION;  Surgeon: [name], MD;  Location: [facility];  Service: Endoscopy;  Laterality: N/A;  . BREAST SURGERY     2 benign tumors removed left breast  . CHOLECYSTECTOMY     . ESOPHAGEAL DILATION     several times by Dr. [name]  . ESOPHAGOGASTRODUODENOSCOPY (EGD) WITH ESOPHAGEAL DILATION N/A 08/01/2012   Procedure: ESOPHAGOGASTRODUODENOSCOPY (EGD) WITH ESOPHAGEAL DILATION;  Surgeon: [name], MD;  Location: [facility];  Service: Endoscopy;  Laterality: N/A;  with c-arm savory dilators  . ESOPHAGOGASTRODUODENOSCOPY (EGD) WITH PROPOFOL N/A 03/12/2015   Procedure: ESOPHAGOGASTRODUODENOSCOPY (EGD) WITH PROPOFOL;  Surgeon: [name], MD;  Location: [facility];  Service: Endoscopy;  Laterality: N/A;  . FLEXIBLE SIGMOIDOSCOPY N/A 05/24/2013   Procedure: FLEXIBLE SIGMOIDOSCOPY;  Surgeon: [name], MD;  Location: [facility];  Service: Endoscopy;  Laterality: N/A;  . FLEXIBLE SIGMOIDOSCOPY N/A 05/26/2013   Procedure:  flex with decompression of sigmoid volvulus;  Surgeon: [name], MD;  Location: [facility];  Service: Endoscopy;  Laterality: N/A;  . FLEXIBLE SIGMOIDOSCOPY N/A 12/20/2015   Procedure: FLEXIBLE SIGMOIDOSCOPY;  Surgeon: Milus Banister, MD;  Location: [facility];  Service: Endoscopy;  Laterality: N/A;  . FLEXIBLE SIGMOIDOSCOPY N/A 04/17/2016   Procedure: FLEXIBLE SIGMOIDOSCOPY;  Surgeon: Ladene Artist, MD;  Location: [facility];  Service: Endoscopy;  Laterality: N/A;  . FLEXIBLE SIGMOIDOSCOPY N/A 04/18/2016   Procedure: FLEXIBLE SIGMOIDOSCOPY;  Surgeon: [name], MD;  Location: [facility];  Service: Endoscopy;  Laterality: N/A;  . FOOT SURGERY  benign tumors from foot  . NOSE SURGERY    . SHOULDER SURGERY  2001   left clavicle excision and acromioplasty  . TOTAL HIP ARTHROPLASTY      Social History   Socioeconomic History  . Marital status: Widowed    Spouse name: Not on file  . Number of children: 5  . Years of education: 10th  . Highest education level: Not on file  Social Needs  . Financial resource strain: Not on file  . Food insecurity - worry: Not on file  . Food insecurity - inability: Not on  file  . Transportation needs - medical: Not on file  . Transportation needs - non-medical: Not on file  Occupational History  . Occupation: Retired  Tobacco Use  . Smoking status: Never Smoker  . Smokeless tobacco: Never Used  Substance and Sexual Activity  . Alcohol use: No  . Drug use: No  . Sexual activity: No  Other Topics Concern  . Not on file  Social History Narrative   Pt lives at [place]    Caffeine Use: very small amount daily   Family History  Problem Relation Age of Onset  . Cancer Unknown   . Heart disease Unknown       VITAL SIGNS BP 140/70   Pulse 71   Temp 98.7 F (37.1 C)   Resp 18   Ht 5\' 7"  (1.702 m)   Wt 150 lb 3.7 oz (68.1 kg)   SpO2 91%   BMI 23.53 kg/m   Outpatient Encounter Medications as of 05/09/2017  Medication Sig  . amLODipine (NORVASC) 2.5 MG tablet Take 2.5 mg by mouth every morning. Hold for BP < 100/60 or HR < 60  . aspirin EC 81 MG tablet Take 81 mg by mouth daily.  . baclofen (LIORESAL) 10 MG tablet Take 5 mg by mouth at bedtime.  . benzocaine-menthol (CHLORASEPTIC) 6-10 MG lozenge Take 1 lozenge by mouth as needed for sore throat.  . carvedilol (COREG) 3.125 MG tablet Take 3.125 mg by mouth daily.  . Cranberry 475 MG CAPS Take 1 capsule by mouth 2 (two) times daily.   Marland Kitchen docusate sodium (COLACE) 100 MG capsule Take 200 mg by mouth at bedtime.   . dorzolamide (TRUSOPT) 2 % ophthalmic solution Place 1 drop into both eyes 3 (three) times daily.  Marland Kitchen escitalopram (LEXAPRO) 10 MG tablet Take 10 mg by mouth daily.  . famotidine (PEPCID) 20 MG tablet Take 20 mg by mouth at bedtime.  . ferrous sulfate 220 (44 Fe) MG/5ML solution Give 5 ml by mouth in the evening for supplement  . fluticasone (FLONASE) 50 MCG/ACT nasal spray Place 2 sprays into both nostrils at bedtime.  . gabapentin (NEURONTIN) 100 MG capsule Take 100 mg by mouth at bedtime.  Marland Kitchen HYDROcodone-acetaminophen (NORCO/VICODIN) 5-325 MG tablet Give 0.5 mg tablet by mouth once  daily for pain.  Marland Kitchen HYDROcodone-acetaminophen (NORCO/VICODIN) 5-325 MG tablet Take 0.5 tablets by mouth every 8 (eight) hours as needed for moderate pain or severe pain.  . Lidocaine 4 % PTCH Apply topically. Apply to Left hip topically at bedtime.  On for 12 hours then remove  . Linaclotide (LINZESS) 145 MCG CAPS capsule Take 145 mcg by mouth daily before breakfast.   . loratadine (CLARITIN) 10 MG tablet Take 10 mg by mouth daily before breakfast.   . Multiple Vitamins-Minerals (CERTAGEN PO) Take 1 tablet by mouth daily.   . Multiple Vitamins-Minerals (HAIR SKIN AND NAILS FORMULA) TABS Take by mouth.  Give 2 tablets by mouth daily  . Nutritional Supplements (NUTRITIONAL SUPPLEMENT PO) Med Pass - Give 60cc by mouth two times daily for weight loss  . omeprazole (PRILOSEC) 20 MG capsule Take 20 mg by mouth daily.  . ondansetron (ZOFRAN) 8 MG tablet Take 8 mg by mouth every 8 (eight) hours as needed for nausea or vomiting.  Marland Kitchen Phenazopyridine HCl (AZO TABS PO) Take 1 tablet by mouth at bedtime.  . polyethylene glycol (MIRALAX / GLYCOLAX) packet Take 17 g by mouth daily at 6 (six) AM.   . Potassium Chloride ER 20 MEQ TBCR Give 40 meq by mouth every morning and 20 meq by mouth in the afternoon  . senna (SENOKOT) 8.6 MG TABS tablet Take 1 tablet (8.6 mg total) by mouth 2 (two) times daily.  . sodium chloride (OCEAN) 0.65 % SOLN nasal spray Place 1 spray into both nostrils as directed. Four times daily And every 24 hours as needed to moisturize nasal passages.  . torsemide (DEMADEX) 20 MG tablet Take 20 mg by mouth daily.  . Travoprost, BAK Free, (TRAVATAN) 0.004 % SOLN ophthalmic solution Place 1 drop into both eyes daily at 8 pm.   . vitamin B-12 (CYANOCOBALAMIN) 500 MCG tablet Take 500 mcg by mouth every evening.    No facility-administered encounter medications on file as of 05/09/2017.      SIGNIFICANT DIAGNOSTIC EXAMS  PREVIOUS  02-15-15: abdominal ultrasound: 1. Gallbladder not visualized. 2.  No hydronephrosis no renal calculus; 3. The pancreas not well visualized; which could be related to gassy abdomen but an appearance which might represent pancreatitis in the appropriate clinical setting.   03-12-15: upper endoscopy: duodenal bulb 1.5-2.0 cm ulcer with clot at base. No active bleeding. Surrounding mucosa was edematous. Biopsy of the surrounding mucosa were done.   12-20-15: ct of abdomen and pelvis: 1. Loosely twisted sigmoid volvulus with transition point. Proximal distention is improved compared to radiography earlier today suggesting partial interval decompression. 2. Chronic findings are stable and described above.  12-20-15: flex sigmoidoscopy: chronic intermittent sigmoid volvulus. The site of volvulus was only slightly twisted during this exam, easily passed with colonoscope and the air filled dilated colon proximal to it was suctioned extensively. Her abdomen was flat, normal after the exam.   02-23-16: renal ultrasound: normal right kidney slightly small left kidney. Right 1.9 cm cyst no hydronephrosis or stones   09-05-16: right lower extremity doppler: DVT: neg   TODAY:   05-09-17: chest x-ray: modest cardiomegaly with mild congestive heart failure worse than 03-19-16  05-09-17: kub: marked colonic ileus or obstruction.      LABS REVIEWED PREVIOUS     08-12-16: wb 4.0; hgb 10.7; hct 32.1 ;mcv 103.2; plt 95; glucose 111 bun 33.0; creat 1.57; k+ 4.4 ;na++ 143; liver normal albumin 3.5  11-24-16: wbc 3.4; hgb 9.9; hct 29.7; mcv 106; plt 98; glucose 122; bun 17.4; creat 1.22; k+ 3.8; na++ 144; ca 8.8; chol 133; ldl 78; trig 75; hdl 41  01-30-17: wbc 4.4; hgb 9.9; hct 28.7; mcv 102.5; plt 91; glucose glucose 232; bun 25; creat 1.50; k+ 3.8; na++ 139; ca 8.7 02-07-17: K+ 4.6 03-26-17: wbvc 4.4; hgb 9.8; hct 28.1; mcv 104.1; plt 102; glucose 174; bun 25.7; creat 1.60; k+ 4.8; na++ 140; ca 8.8; UA: + nitrate 02-21-17: vit B 1: 40; vit B 12: 685; vit D 45.39     TODAY:   05-08-17:  wbc 4.2; hgb 9.4; hct 28.5; mcv 104.3; plt 97; glucose 117; bun  27.1; creat 1.38; k+ 4.6; na++ 144; ca 8.8; liver normal albumin 3.8   Review of Systems  Unable to perform ROS: Dementia (confused )    Physical Exam  Constitutional: No distress.  Frail   Neck: Neck supple. No thyromegaly present.  Cardiovascular: Normal rate, regular rhythm and intact distal pulses.  Murmur heard. Pulmonary/Chest: Effort normal and breath sounds normal. No respiratory distress.  Abdominal: She exhibits distension. There is no tenderness.  Abdomen is distended bowel sounds hypoactive Rectal exam no stool present.   Musculoskeletal: She exhibits edema.  Has 1+ right lower extremity Is able to move all extremities    Lymphadenopathy:    She has no cervical adenopathy.  Neurological: She is alert.  Skin: Skin is warm and dry. She is not diaphoretic.  Psychiatric: She has a normal mood and affect.      ASSESSMENT/ PLAN:  TODAY:   1. Colonic ileus 2. Volvulus of sigmoid colon  I have discussed with both son and daughter her status;  Will send to the ED for further treatment options   Time spent with patient and family 45 minutes: discussed medical status; current medical issues and treatment options: family has verbalized understanding.     MD is aware of resident's narcotic use and is in agreement with current plan of care. We will attempt to wean resident as apropriate     Ok Edwards NP [facility]  Contact [phone] Monday through Friday 8am- 5pm  After hours call [phone]

## 2017-05-09 NOTE — ED Notes (Signed)
[facility] called, Pt awaiting Transport

## 2017-05-09 NOTE — ED Notes (Signed)
Pt dtr Sula Soda is at bedside.

## 2017-05-09 NOTE — Discharge Instructions (Addendum)
You have been treated with fosfomycin in the Emergency Department since your urine looked consistent with a bladder infection and you were more confused.  You were given one dose of Lasix here since your BNP was elevated.  Please follow up with the results of your CT abdomen/pelvis. They are attached. The results should be compared with your most result colonoscopy. No obstruction

## 2017-05-10 ENCOUNTER — Encounter: Payer: Self-pay | Admitting: Adult Health

## 2017-05-10 ENCOUNTER — Non-Acute Institutional Stay (SKILLED_NURSING_FACILITY): Payer: Medicare Other | Admitting: Adult Health

## 2017-05-10 DIAGNOSIS — K567 Ileus, unspecified: Secondary | ICD-10-CM | POA: Diagnosis not present

## 2017-05-10 DIAGNOSIS — K562 Volvulus: Secondary | ICD-10-CM

## 2017-05-10 NOTE — Progress Notes (Signed)
Location:   [facility] Room Number: 093 A Place of Service:  SNF (31)   CODE STATUS: DNR  Allergies  Allergen Reactions  . Aspirin Other (See Comments)    [name]    Chief Complaint  Patient presents with  . Acute Visit    ER Follow up    HPI:  She had been taken to the ED for her volvulus of sigmoid colon.  She did have an ct of of her abdomen which demonstrated chronic distended sigmoid colon. There were changes made. She did have a urine culture perform and is positive for uti. She does not have any symptoms for an infection. More than likely this represents a colonization.   Past Medical History:  Diagnosis Date  . Altered mental status   . Anemia   . Atrial fibrillation ([place])   . CKD (chronic kidney disease) stage 3, GFR 30-59 ml/min (HCC) 02/27/2015  . Constipation 02/27/2015  . Coronary artery disease   . Dementia   . Depression   . Diabetes mellitus   . Edema of lower extremity 07/13/11   right leg more swollen than left leg  . Encephalopathy   . Enlarged heart   . Esophageal dysmotility 07/02/12  . GERD (gastroesophageal reflux disease)   . History of adenomatous polyp of colon 06/24/99  . Horseshoe kidney   . Hyperlipemia   . Hypertension   . Pancreatic lesion 05/22/11   no further workup  per PCP/family due to age  . Parkinson disease ([place])   . Peripheral neuropathy   . Sigmoid volvulus ([place]) 05/24/2013  . Thrombophlebitis   . TIA (transient ischemic attack) 12/19/2012    Past Surgical History:  Procedure Laterality Date  . ABDOMINAL HYSTERECTOMY    . BOWEL DECOMPRESSION N/A 04/18/2016   Procedure: BOWEL DECOMPRESSION;  Surgeon: [name], MD;  Location: [facility];  Service: Endoscopy;  Laterality: N/A;  . BREAST SURGERY     2 benign tumors removed left breast  . CHOLECYSTECTOMY    . ESOPHAGEAL DILATION     several times by Dr. [name]  . ESOPHAGOGASTRODUODENOSCOPY (EGD) WITH ESOPHAGEAL DILATION N/A 08/01/2012   Procedure: ESOPHAGOGASTRODUODENOSCOPY (EGD) WITH ESOPHAGEAL DILATION;  Surgeon: [name], MD;  Location: [facility];  Service: Endoscopy;  Laterality: N/A;  with c-arm savory dilators  . ESOPHAGOGASTRODUODENOSCOPY (EGD) WITH PROPOFOL N/A 03/12/2015   Procedure: ESOPHAGOGASTRODUODENOSCOPY (EGD) WITH PROPOFOL;  Surgeon: [name], MD;  Location: [facility];  Service: Endoscopy;  Laterality: N/A;  . FLEXIBLE SIGMOIDOSCOPY N/A 05/24/2013   Procedure: FLEXIBLE SIGMOIDOSCOPY;  Surgeon: [name], MD;  Location: [facility];  Service: Endoscopy;  Laterality: N/A;  . FLEXIBLE SIGMOIDOSCOPY N/A 05/26/2013   Procedure:  flex with decompression of sigmoid volvulus;  Surgeon: [name], MD;  Location: [facility];  Service: Endoscopy;  Laterality: N/A;  . FLEXIBLE SIGMOIDOSCOPY N/A 12/20/2015   Procedure: FLEXIBLE SIGMOIDOSCOPY;  Surgeon: Milus Banister, MD;  Location: [facility];  Service: Endoscopy;  Laterality: N/A;  . FLEXIBLE SIGMOIDOSCOPY N/A 04/17/2016   Procedure: FLEXIBLE SIGMOIDOSCOPY;  Surgeon: Ladene Artist, MD;  Location: [facility];  Service: Endoscopy;  Laterality: N/A;  . FLEXIBLE SIGMOIDOSCOPY N/A 04/18/2016   Procedure: FLEXIBLE SIGMOIDOSCOPY;  Surgeon: [name], MD;  Location: [facility];  Service: Endoscopy;  Laterality: N/A;  . FOOT SURGERY     benign tumors from foot  . NOSE SURGERY    . SHOULDER SURGERY  2001   left clavicle excision  and acromioplasty  . TOTAL HIP ARTHROPLASTY      Social History   Socioeconomic History  . Marital status: Widowed    Spouse name: Not on file  . Number of children: 5  . Years of education: 10th  . Highest education level: Not on file  Social Needs  . Financial resource strain: Not on file  . Food insecurity - worry: Not on file  . Food insecurity - inability: Not on file  . Transportation needs - medical: Not on file  . Transportation needs - non-medical: Not on file  Occupational History  .  Occupation: Retired  Tobacco Use  . Smoking status: Never Smoker  . Smokeless tobacco: Never Used  Substance and Sexual Activity  . Alcohol use: No  . Drug use: No  . Sexual activity: No  Other Topics Concern  . Not on file  Social History Narrative   Pt lives at [place]    Caffeine Use: very small amount daily   Family History  Problem Relation Age of Onset  . Cancer Unknown   . Heart disease Unknown       VITAL SIGNS BP 140/70   Pulse 71   Temp 98.7 F (37.1 C)   Resp 18   Ht 5\' 7"  (1.702 m)   Wt 150 lb 3.2 oz (68.1 kg)   SpO2 91%   BMI 23.52 kg/m   Outpatient Encounter Medications as of 05/10/2017  Medication Sig  . amLODipine (NORVASC) 2.5 MG tablet Take 2.5 mg by mouth every morning. Hold for BP < 100/60 or HR < 60  . aspirin EC 81 MG tablet Take 81 mg by mouth daily.  . baclofen (LIORESAL) 10 MG tablet Take 5 mg by mouth at bedtime.  . benzocaine-menthol (CHLORASEPTIC) 6-10 MG lozenge Take 1 lozenge by mouth as needed for sore throat.  . carvedilol (COREG) 3.125 MG tablet Take 3.125 mg by mouth daily.  . Cranberry 475 MG CAPS Take 1 capsule by mouth 2 (two) times daily.   Marland Kitchen docusate sodium (COLACE) 100 MG capsule Take 200 mg by mouth at bedtime.   . dorzolamide (TRUSOPT) 2 % ophthalmic solution Place 1 drop into both eyes 3 (three) times daily.  Marland Kitchen escitalopram (LEXAPRO) 10 MG tablet Take 10 mg by mouth daily.  . famotidine (PEPCID) 20 MG tablet Take 20 mg by mouth at bedtime.  . ferrous sulfate 220 (44 Fe) MG/5ML solution Give 5 ml by mouth in the evening for supplement  . fluticasone (FLONASE) 50 MCG/ACT nasal spray Place 2 sprays into both nostrils at bedtime.  . gabapentin (NEURONTIN) 100 MG capsule Take 100 mg by mouth at bedtime.  Marland Kitchen HYDROcodone-acetaminophen (NORCO/VICODIN) 5-325 MG tablet Give 0.5 mg tablet by mouth once daily for pain.  Marland Kitchen HYDROcodone-acetaminophen (NORCO/VICODIN) 5-325 MG tablet Take 0.5 tablets by mouth every 8 (eight) hours as needed  for moderate pain or severe pain.   . Lidocaine 4 % PTCH Apply 1 patch topically every 12 (twelve) hours. Apply to Left hip topically at bedtime.  On for 12 hours then remove   . Linaclotide (LINZESS) 145 MCG CAPS capsule Take 145 mcg by mouth daily before breakfast.   . loratadine (CLARITIN) 10 MG tablet Take 10 mg by mouth daily before breakfast.   . Multiple Vitamins-Minerals (CERTAGEN PO) Take 1 tablet by mouth every evening.   . Multiple Vitamins-Minerals (HAIR SKIN AND NAILS FORMULA) TABS Take by mouth. Give 2 tablets by mouth daily  . Nutritional Supplements (NUTRITIONAL SUPPLEMENT  PO) Med Pass - Give 60cc by mouth two times daily for weight loss  . omeprazole (PRILOSEC) 20 MG capsule Take 20 mg by mouth daily.  . ondansetron (ZOFRAN) 8 MG tablet Take 8 mg by mouth every 8 (eight) hours as needed for nausea or vomiting.  Marland Kitchen Phenazopyridine HCl (AZO TABS PO) Take 1 tablet by mouth at bedtime.  . polyethylene glycol (MIRALAX / GLYCOLAX) packet Take 17 g by mouth daily.   . potassium chloride SA (K-DUR,KLOR-CON) 20 MEQ tablet Take 20-40 mEq by mouth 2 (two) times daily. Give 40 meq (2 tablets) in the morning and Give 20 meq (1 tablet) by mouth in the afternoon.  . senna (SENOKOT) 8.6 MG TABS tablet Take 1 tablet (8.6 mg total) by mouth 2 (two) times daily.  . sodium chloride (OCEAN) 0.65 % SOLN nasal spray Place 1 spray into both nostrils as directed. Four times daily And every 24 hours as needed to moisturize nasal passages.  . torsemide (DEMADEX) 20 MG tablet Take 20 mg by mouth daily.  . Travoprost, BAK Free, (TRAVATAN) 0.004 % SOLN ophthalmic solution Place 1 drop into both eyes daily at 8 pm.   . vitamin B-12 (CYANOCOBALAMIN) 500 MCG tablet Take 500 mcg by mouth every evening.   . [DISCONTINUED] Potassium Chloride ER 20 MEQ TBCR Give 1 tablet by mouth in the afternoon   No facility-administered encounter medications on file as of 05/10/2017.      SIGNIFICANT DIAGNOSTIC  EXAMS  PREVIOUS  12-20-15: flex sigmoidoscopy: chronic intermittent sigmoid volvulus. The site of volvulus was only slightly twisted during this exam, easily passed with colonoscope and the air filled dilated colon proximal to it was suctioned extensively. Her abdomen was flat, normal after the exam.   02-23-16: renal ultrasound: normal right kidney slightly small left kidney. Right 1.9 cm cyst no hydronephrosis or stones   09-05-16: right lower extremity doppler: DVT: neg   04-18-17: ct of abdomen and pelvis:  Sigmoid volvulus.  2 mm nonobstructing LEFT nephrolithiasis.  Horseshoe kidney. Severe atherosclerosis.  04-18-17: kub: Stable distension of the sigmoid colon and the gas pattern would be compatible with a sigmoid volvulus. No change from the recent CT Exam. No evidence for free air.  05-19-17: acute abdomen x-ray: Sigmoid volvulus with improving gaseous distension. No evidence of free air or pneumatosis  04-20-17: chest x-ray: 1. No acute abnormality. 2. Stable cardiomegaly and aortic atherosclerosis.  04-23-17: ct of cervical spine: 1. No fracture or malalignment. 2. Multilevel degenerative changes as described above with canal narrowing most prominent at C4-5 measuring 8 mm in AP dimension. Neural foraminal narrowing, right greater than left, at multiple levels as described above.   05-09-17: chest x-ray: modest cardiomegaly with mild congestive heart failure worse than 03-19-16  05-09-17: kub: marked colonic ileus or obstruction.    TODAY:     05-09-17: ct of head: Atrophy with small vessel chronic ischemic changes of deep cerebral white matter. Old lacunar infarct RIGHT basal ganglia. No acute intracranial abnormalities.  05-09-17: ct of abdomen and pelvis:  1. Gaseous prominence in the distended sigmoid colon as on the prior exam, likely a chronic functional colonic abnormality. 2. Air- fluid levels in the descending colon much raises suspicion for a diarrheal process. 3.  Localized questionable wall thickening in the distal ascending colon without surrounding adenopathy. Probably incidental but strictly speaking I cannot exclude a colon tumor. Correlation with the patient's colon cancer screening history is recommended 4. Other imaging findings of potential clinical significance:  Moderate to prominent cardiomegaly. Aortic Atherosclerosis Trace right pleural effusion. Airway thickening is present, suggesting bronchitis or reactive airways disease. Horseshoe kidney. Bony demineralization. Lumbar spondylosis and degenerative disc disease.   LABS REVIEWED PREVIOUS     08-12-16: wb 4.0; hgb 10.7; hct 32.1 ;mcv 103.2; plt 95; glucose 111 bun 33.0; creat 1.57; k+ 4.4 ;na++ 143; liver normal albumin 3.5  11-24-16: wbc 3.4; hgb 9.9; hct 29.7; mcv 106; plt 98; glucose 122; bun 17.4; creat 1.22; k+ 3.8; na++ 144; ca 8.8; chol 133; ldl 78; trig 75; hdl 41  01-30-17: wbc 4.4; hgb 9.9; hct 28.7; mcv 102.5; plt 91; glucose glucose 232; bun 25; creat 1.50; k+ 3.8; na++ 139; ca 8.7 02-07-17: K+ 4.6 03-26-17: wbvc 4.4; hgb 9.8; hct 28.1; mcv 104.1; plt 102; glucose 174; bun 25.7; creat 1.60; k+ 4.8; na++ 140; ca 8.8; UA: + nitrate 02-21-17: vit B 1: 40; vit B 12: 685; vit D 45.39    05-08-17: wbc 4.2; hgb 9.4; hct 28.5; mcv 104.3; plt 97; glucose 117; bun 27.1; creat 1.38; k+ 4.6; na++ 144; ca 8.8; liver normal albumin 3.8   TODAY:   05-09-17: wbc 4.8; hgb 9.5; hct 29.4; mcv 105.8; plt 95; glucose 133; bun 25; creat 1.51; k+ 4.1; na++ 139; ca 8.8; liver normal albumin 3.6; lipase 24; BNP 429.7 urine culture: e-coli    Review of Systems  Reason unable to perform ROS: poor historian   Constitutional: Negative for malaise/fatigue.  Respiratory: Negative for cough and shortness of breath.   Cardiovascular: Negative for chest pain, palpitations and leg swelling.  Gastrointestinal: Positive for constipation. Negative for abdominal pain, heartburn and nausea.  Genitourinary: Negative for  dysuria.  Musculoskeletal: Negative for myalgias.  Psychiatric/Behavioral: The patient is not nervous/anxious.     Physical Exam  Constitutional: No distress.  Neck: No thyromegaly present.  Cardiovascular: Normal rate, regular rhythm and intact distal pulses.  Murmur heard. Pulmonary/Chest: Effort normal and breath sounds normal. No respiratory distress.  Abdominal: Soft. There is no tenderness.  Right side of abdomen hypoactive Sounds are high pitched Mild supra abdomen tenderness   Musculoskeletal: Normal range of motion. She exhibits edema.  Has 1+ right lower extremity edema present   Lymphadenopathy:    She has no cervical adenopathy.  Neurological: She is alert.  Skin: Skin is warm and dry. She is not diaphoretic.  Psychiatric: She has a normal mood and affect.      ASSESSMENT/ PLAN:  TODAY:   1. Colonic ileus 2. Volvulus of sigmoid colon  She is colonized and more than likely this does not represent an acute infection. Will not make further changes at this time I have educated the nursing staff to closely monitor her BM's.   MD is aware of resident's narcotic use and is in agreement with current plan of care. We will attempt to wean resident as apropriate     Ok Edwards NP [facility]  Contact [phone] Monday through Friday 8am- 5pm  After hours call [phone]

## 2017-05-12 LAB — URINE CULTURE

## 2017-05-13 ENCOUNTER — Telehealth: Payer: Self-pay

## 2017-05-13 NOTE — Telephone Encounter (Signed)
Post ED Visit - Positive Culture Follow-up  Culture report reviewed by antimicrobial stewardship pharmacist:  []  Elenor Quinones, Pharm.D. []  Heide Guile, Pharm.D., BCPS AQ-ID [x]  Parks Neptune, Pharm.D., BCPS []  Alycia Rossetti, Pharm.D., BCPS []  [place], Pharm.D., BCPS, AAHIVP []  [name], Pharm.D., BCPS, AAHIVP []  Salome Arnt, PharmD, BCPS []  Dimitri Ped, PharmD, BCPS []  Vincenza Hews, PharmD, BCPS  Positive urine culture Treated with Fosfomycin, organism sensitive to the same and no further patient follow-up is required at this time.  Genia Del 05/13/2017, 11:46 AM

## 2017-05-21 ENCOUNTER — Encounter: Payer: Self-pay | Admitting: Adult Health

## 2017-05-21 ENCOUNTER — Non-Acute Institutional Stay (SKILLED_NURSING_FACILITY): Payer: Medicare Other | Admitting: Adult Health

## 2017-05-21 DIAGNOSIS — M150 Primary generalized (osteo)arthritis: Secondary | ICD-10-CM | POA: Diagnosis not present

## 2017-05-21 DIAGNOSIS — D638 Anemia in other chronic diseases classified elsewhere: Secondary | ICD-10-CM | POA: Diagnosis not present

## 2017-05-21 DIAGNOSIS — J9611 Chronic respiratory failure with hypoxia: Secondary | ICD-10-CM

## 2017-05-21 DIAGNOSIS — G8929 Other chronic pain: Secondary | ICD-10-CM | POA: Diagnosis not present

## 2017-05-21 DIAGNOSIS — G44229 Chronic tension-type headache, not intractable: Secondary | ICD-10-CM | POA: Diagnosis not present

## 2017-05-21 DIAGNOSIS — M159 Polyosteoarthritis, unspecified: Secondary | ICD-10-CM

## 2017-05-21 DIAGNOSIS — I482 Chronic atrial fibrillation, unspecified: Secondary | ICD-10-CM

## 2017-05-21 NOTE — Progress Notes (Signed)
Location:   [facility] Room Number: 235 A Place of Service:  SNF (31)   CODE STATUS: DNR  Allergies  Allergen Reactions  . Aspirin Other (See Comments)    [name]    Chief Complaint  Patient presents with  . Medical Management of Chronic Issues    Anemia; afib; chronic respiratory failure; chronic pain    HPI:  She is a 83 year old long term resident of this facility being seen for the management of her chronic illnesses: chronic atrial fibrillation; chronic respiratory failure hypoxia; chronic pain due to osteoarthritis of multiple joints; chronic headaches; anemia of chronic disease. She is unable to fully participate in the hpi or ros. There are no reports of uncontrolled pain; changes in appetite; no reports of chest pain or shortness of breath. There are no nursing concerns at this time.   Past Medical History:  Diagnosis Date  . Altered mental status   . Anemia   . Atrial fibrillation ([place])   . CKD (chronic kidney disease) stage 3, GFR 30-59 ml/min (HCC) 02/27/2015  . Constipation 02/27/2015  . Coronary artery disease   . Dementia   . Depression   . Diabetes mellitus   . Edema of lower extremity 07/13/11   right leg more swollen than left leg  . Encephalopathy   . Enlarged heart   . Esophageal dysmotility 07/02/12  . GERD (gastroesophageal reflux disease)   . History of adenomatous polyp of colon 06/24/99  . Horseshoe kidney   . Hyperlipemia   . Hypertension   . Pancreatic lesion 05/22/11   no further workup  per PCP/family due to age  . Parkinson disease ([place])   . Peripheral neuropathy   . Sigmoid volvulus ([place]) 05/24/2013  . Thrombophlebitis   . TIA (transient ischemic attack) 12/19/2012    Past Surgical History:  Procedure Laterality Date  . ABDOMINAL HYSTERECTOMY    . BOWEL DECOMPRESSION N/A 04/18/2016   Procedure: BOWEL DECOMPRESSION;  Surgeon: [name], MD;  Location: [facility];  Service: Endoscopy;  Laterality: N/A;  .  BREAST SURGERY     2 benign tumors removed left breast  . CHOLECYSTECTOMY    . ESOPHAGEAL DILATION     several times by Dr. [name]  . ESOPHAGOGASTRODUODENOSCOPY (EGD) WITH ESOPHAGEAL DILATION N/A 08/01/2012   Procedure: ESOPHAGOGASTRODUODENOSCOPY (EGD) WITH ESOPHAGEAL DILATION;  Surgeon: [name], MD;  Location: [facility];  Service: Endoscopy;  Laterality: N/A;  with c-arm savory dilators  . ESOPHAGOGASTRODUODENOSCOPY (EGD) WITH PROPOFOL N/A 03/12/2015   Procedure: ESOPHAGOGASTRODUODENOSCOPY (EGD) WITH PROPOFOL;  Surgeon: [name], MD;  Location: [facility];  Service: Endoscopy;  Laterality: N/A;  . FLEXIBLE SIGMOIDOSCOPY N/A 05/24/2013   Procedure: FLEXIBLE SIGMOIDOSCOPY;  Surgeon: [name], MD;  Location: [facility];  Service: Endoscopy;  Laterality: N/A;  . FLEXIBLE SIGMOIDOSCOPY N/A 05/26/2013   Procedure:  flex with decompression of sigmoid volvulus;  Surgeon: [name], MD;  Location: [facility];  Service: Endoscopy;  Laterality: N/A;  . FLEXIBLE SIGMOIDOSCOPY N/A 12/20/2015   Procedure: FLEXIBLE SIGMOIDOSCOPY;  Surgeon: Milus Banister, MD;  Location: [facility];  Service: Endoscopy;  Laterality: N/A;  . FLEXIBLE SIGMOIDOSCOPY N/A 04/17/2016   Procedure: FLEXIBLE SIGMOIDOSCOPY;  Surgeon: Ladene Artist, MD;  Location: [facility];  Service: Endoscopy;  Laterality: N/A;  . FLEXIBLE SIGMOIDOSCOPY N/A 04/18/2016   Procedure: FLEXIBLE SIGMOIDOSCOPY;  Surgeon: [name], MD;  Location: [facility];  Service: Endoscopy;  Laterality: N/A;  .  FOOT SURGERY     benign tumors from foot  . NOSE SURGERY    . SHOULDER SURGERY  2001   left clavicle excision and acromioplasty  . TOTAL HIP ARTHROPLASTY      Social History   Socioeconomic History  . Marital status: Widowed    Spouse name: Not on file  . Number of children: 5  . Years of education: 10th  . Highest education level: Not on file  Social Needs  . Financial resource strain: Not on file  .  Food insecurity - worry: Not on file  . Food insecurity - inability: Not on file  . Transportation needs - medical: Not on file  . Transportation needs - non-medical: Not on file  Occupational History  . Occupation: Retired  Tobacco Use  . Smoking status: Never Smoker  . Smokeless tobacco: Never Used  Substance and Sexual Activity  . Alcohol use: No  . Drug use: No  . Sexual activity: No  Other Topics Concern  . Not on file  Social History Narrative   Pt lives at [place]    Caffeine Use: very small amount daily   Family History  Problem Relation Age of Onset  . Cancer Unknown   . Heart disease Unknown       VITAL SIGNS BP 122/78   Pulse 77   Temp 98.3 F (36.8 C)   Resp 20   Ht 5\' 7"  (1.702 m)   Wt 150 lb 9.6 oz (68.3 kg)   SpO2 96%   BMI 23.59 kg/m   Outpatient Encounter Medications as of 05/21/2017  Medication Sig  . amLODipine (NORVASC) 2.5 MG tablet Take 2.5 mg by mouth every morning. Hold for BP < 100/60 or HR < 60  . aspirin EC 81 MG tablet Take 81 mg by mouth daily.  Marland Kitchen aspirin-acetaminophen-caffeine (EXCEDRIN MIGRAINE) 250-250-65 MG tablet Take 1 tablet by mouth daily.  . baclofen (LIORESAL) 10 MG tablet Take 5 mg by mouth at bedtime.  . benzocaine-menthol (CHLORASEPTIC) 6-10 MG lozenge Take 1 lozenge by mouth as needed for sore throat.  . carvedilol (COREG) 3.125 MG tablet Take 3.125 mg by mouth daily.  . Cranberry 475 MG CAPS Take 1 capsule by mouth 2 (two) times daily.   . diclofenac sodium (VOLTAREN) 1 % GEL Apply 2 grams to neck topically three times a day for pain  . docusate sodium (COLACE) 100 MG capsule Take 200 mg by mouth at bedtime.   . dorzolamide (TRUSOPT) 2 % ophthalmic solution Place 1 drop into both eyes 3 (three) times daily.  Marland Kitchen escitalopram (LEXAPRO) 10 MG tablet Take 10 mg by mouth daily.  . famotidine (PEPCID) 20 MG tablet Take 20 mg by mouth at bedtime.  . ferrous sulfate 220 (44 Fe) MG/5ML solution Give 5 ml by mouth in the evening  for supplement  . fluticasone (FLONASE) 50 MCG/ACT nasal spray Place 2 sprays into both nostrils at bedtime.  . gabapentin (NEURONTIN) 100 MG capsule Take 100 mg by mouth at bedtime.  . Linaclotide (LINZESS) 145 MCG CAPS capsule Take 145 mcg by mouth daily before breakfast.   . loratadine (CLARITIN) 10 MG tablet Take 10 mg by mouth daily before breakfast.   . Multiple Vitamins-Minerals (CERTAGEN PO) Take 1 tablet by mouth every evening.   . Multiple Vitamins-Minerals (HAIR SKIN AND NAILS FORMULA) TABS Take by mouth. Give 2 tablets by mouth daily  . Nutritional Supplements (NUTRITIONAL SUPPLEMENT PO) Med Pass - Give 60cc by mouth two times  daily for weight loss  . omeprazole (PRILOSEC) 20 MG capsule Take 20 mg by mouth daily.  . ondansetron (ZOFRAN) 8 MG tablet Take 8 mg by mouth every 8 (eight) hours as needed for nausea or vomiting.  Marland Kitchen Phenazopyridine HCl (AZO TABS PO) Take 1 tablet by mouth at bedtime.  . polyethylene glycol (MIRALAX / GLYCOLAX) packet Take 17 g by mouth daily.   . potassium chloride SA (K-DUR,KLOR-CON) 20 MEQ tablet Take 20-40 mEq by mouth 2 (two) times daily. Give 40 meq (2 tablets) in the morning and Give 20 meq (1 tablet) by mouth in the afternoon.  . senna (SENOKOT) 8.6 MG TABS tablet Take 1 tablet (8.6 mg total) by mouth 2 (two) times daily.  . sodium chloride (OCEAN) 0.65 % SOLN nasal spray Place 1 spray into both nostrils as directed. Four times daily And every 24 hours as needed to moisturize nasal passages.  . torsemide (DEMADEX) 20 MG tablet Take 20 mg by mouth daily.  . Travoprost, BAK Free, (TRAVATAN) 0.004 % SOLN ophthalmic solution Place 1 drop into both eyes daily at 8 pm.   . vitamin B-12 (CYANOCOBALAMIN) 500 MCG tablet Take 500 mcg by mouth every evening.   . [DISCONTINUED] HYDROcodone-acetaminophen (NORCO/VICODIN) 5-325 MG tablet Give 0.5 mg tablet by mouth once daily for pain. (Patient not taking: Reported on 05/21/2017)  . [DISCONTINUED]  HYDROcodone-acetaminophen (NORCO/VICODIN) 5-325 MG tablet Take 0.5 tablets by mouth every 8 (eight) hours as needed for moderate pain or severe pain.   . [DISCONTINUED] Lidocaine 4 % PTCH Apply 1 patch topically every 12 (twelve) hours. Apply to Left hip topically at bedtime.  On for 12 hours then remove    No facility-administered encounter medications on file as of 05/21/2017.      SIGNIFICANT DIAGNOSTIC EXAMS  PREVIOUS  12-20-15: ct of abdomen and pelvis: 1. Loosely twisted sigmoid volvulus with transition point. Proximal distention is improved compared to radiography earlier today suggesting partial interval decompression. 2. Chronic findings are stable and described above.  12-20-15: flex sigmoidoscopy: chronic intermittent sigmoid volvulus. The site of volvulus was only slightly twisted during this exam, easily passed with colonoscope and the air filled dilated colon proximal to it was suctioned extensively. Her abdomen was flat, normal after the exam.   02-23-16: renal ultrasound: normal right kidney slightly small left kidney. Right 1.9 cm cyst no hydronephrosis or stones   09-05-16: right lower extremity doppler: DVT: neg   05-09-17: chest x-ray: modest cardiomegaly with mild congestive heart failure worse than 03-19-16  05-09-17: kub: marked colonic ileus or obstruction.   NO NEW EXAMS      LABS REVIEWED PREVIOUS     08-12-16: wb 4.0; hgb 10.7; hct 32.1 ;mcv 103.2; plt 95; glucose 111 bun 33.0; creat 1.57; k+ 4.4 ;na++ 143; liver normal albumin 3.5  11-24-16: wbc 3.4; hgb 9.9; hct 29.7; mcv 106; plt 98; glucose 122; bun 17.4; creat 1.22; k+ 3.8; na++ 144; ca 8.8; chol 133; ldl 78; trig 75; hdl 41  01-30-17: wbc 4.4; hgb 9.9; hct 28.7; mcv 102.5; plt 91; glucose glucose 232; bun 25; creat 1.50; k+ 3.8; na++ 139; ca 8.7 02-07-17: K+ 4.6 03-26-17: wbvc 4.4; hgb 9.8; hct 28.1; mcv 104.1; plt 102; glucose 174; bun 25.7; creat 1.60; k+ 4.8; na++ 140; ca 8.8; UA: + nitrate 02-21-17: vit B 1:  40; vit B 12: 685; vit D 45.39    05-08-17: wbc 4.2; hgb 9.4; hct 28.5; mcv 104.3; plt 97; glucose 117; bun 27.1; creat  1.38; k+ 4.6; na++ 144; ca 8.8; liver normal albumin 3.8   NO NEW LABS    Review of Systems  Unable to perform ROS: Dementia (confused )    Physical Exam  Constitutional: No distress.  Frail   Neck: No thyromegaly present.  Cardiovascular: Normal rate, regular rhythm and intact distal pulses.  Murmur heard. 1/6  Pulmonary/Chest: Effort normal and breath sounds normal. No respiratory distress.  Abdominal: Soft. Bowel sounds are normal. She exhibits distension. There is no tenderness.  Musculoskeletal: She exhibits edema.  Is able to move all extremities 1+ right lower extremity edema   Lymphadenopathy:    She has no cervical adenopathy.  Neurological: She is alert.  Skin: Skin is warm and dry. She is not diaphoretic.       ASSESSMENT/ PLAN:  TODAY:   1. Anemia: stable  will continue iron daily hgb is 9.4  2. Chronic afib: is stable will continue coreg 3.125 mg daily for rate control. Will continue asa 81mg  daily .    3. Chronic respiratory failure: is stable  is 02 dependent. Will continue to monitor   4. Chronic pain due to osteoarthritis and chronic headaches is stable   will continue baclofen 5 mg nightly for leg spasticity neurontin 100 mg nightly for  neuropathic pain will continue excederin migraine daily    PREVIOUS  5. Depression: is stable receives benefit form lexapro 10 mg daily   6. Chronic constipation  Has recurrent sigmoid volvulus: will continue colace 200 mg daily linzess 145 mcg daily; senna twice daily; miralax daily   Her family has decided upon conservative treatment including repeat decompression by endoscope if needed.   7. Glaucoma: will continue  trusopt to both eyes three times daily; travatan to both eyes nightly   8. CKD stage III: without change  bun/creat 25.7/1.60  9. Hypokalemia: k+ 4.8;  will continue k+ 40 meq in the  AM and 20 meq in the PM. Her son has been told in the past by doctors that she needs to keep her k+ on at least 4.0. If her k+ is less than 4 she could become confused.   10. Hypertension: is stable  b/p 122/78  will continue norvasc 2.5 mg daily coreg 3.15 mg daily   11. Chronic diastolic heart failure: is stable will continue demadex 20 mg daily   Will not make changes will monitor   12. Duodenal ulcer: is stable  will continue prilosec 20 mg  daily  Has zofran 8 mg every 8 hours as needed  Will continue pepcid 20 mg nightly to help with gerd symptoms and to avoid using PPI twice daily   13. Allergic rhinitis:is stable  will continue flonase nightly and claritin 10 mg daily  14. Chronic UTI: is without recent UTI: will continue AZO nightly and cranberry twice daily will monitor  15. GERD: has history of duodenal ulcer: will continue pepcid 20 mg daily and is taking prilosec 20 mg daily      Will check cbc; bmp   MD is aware of resident's narcotic use and is in agreement with current plan of care. We will attempt to wean resident as apropriate     Ok Edwards NP [facility]  Contact [phone] Monday through Friday 8am- 5pm  After hours call [phone]

## 2017-05-22 LAB — CBC AND DIFFERENTIAL
HEMATOCRIT: 28 — AB (ref 36–46)
HEMOGLOBIN: 9.3 — AB (ref 12.0–16.0)
NEUTROS ABS: 1
Platelets: 101 — AB (ref 150–399)
WBC: 3.4

## 2017-05-22 LAB — BASIC METABOLIC PANEL
BUN: 29 — AB (ref 4–21)
Creatinine: 1.5 — AB (ref 0.5–1.1)
Glucose: 116
POTASSIUM: 4.7 (ref 3.4–5.3)
SODIUM: 146 (ref 137–147)

## 2017-05-29 LAB — CBC AND DIFFERENTIAL
HCT: 28 — AB (ref 36–46)
Hemoglobin: 9.5 — AB (ref 12.0–16.0)
Neutrophils Absolute: 2
Platelets: 90 — AB (ref 150–399)
WBC: 4.5

## 2017-06-12 LAB — BASIC METABOLIC PANEL
BUN: 32 — AB (ref 4–21)
Creatinine: 2 — AB (ref 0.5–1.1)
POTASSIUM: 5.3 (ref 3.4–5.3)
SODIUM: 145 (ref 137–147)

## 2017-06-12 LAB — TSH: TSH: 4.73 (ref 0.41–5.90)

## 2017-06-12 LAB — HEMOGLOBIN A1C: Hemoglobin A1C: 6.2

## 2017-06-14 LAB — MICROALBUMIN, URINE: Microalb, Ur: 20.2

## 2017-06-18 ENCOUNTER — Encounter: Payer: Self-pay | Admitting: Internal Medicine

## 2017-06-18 ENCOUNTER — Non-Acute Institutional Stay (SKILLED_NURSING_FACILITY): Payer: Medicare Other | Admitting: Internal Medicine

## 2017-06-18 DIAGNOSIS — G8929 Other chronic pain: Secondary | ICD-10-CM

## 2017-06-18 DIAGNOSIS — R8290 Unspecified abnormal findings in urine: Secondary | ICD-10-CM | POA: Diagnosis not present

## 2017-06-18 DIAGNOSIS — M26609 Unspecified temporomandibular joint disorder, unspecified side: Secondary | ICD-10-CM

## 2017-06-18 DIAGNOSIS — I482 Chronic atrial fibrillation, unspecified: Secondary | ICD-10-CM

## 2017-06-18 DIAGNOSIS — F0280 Dementia in other diseases classified elsewhere without behavioral disturbance: Secondary | ICD-10-CM | POA: Diagnosis not present

## 2017-06-18 DIAGNOSIS — M150 Primary generalized (osteo)arthritis: Secondary | ICD-10-CM | POA: Diagnosis not present

## 2017-06-18 DIAGNOSIS — R300 Dysuria: Secondary | ICD-10-CM | POA: Diagnosis not present

## 2017-06-18 DIAGNOSIS — M159 Polyosteoarthritis, unspecified: Secondary | ICD-10-CM

## 2017-06-18 NOTE — Progress Notes (Signed)
Patient ID: Maria Christian, female   DOB: 1924-11-03, 84 y.o.   MRN: [id number]   Location:  [facility] Room Number: 921 A Place of Service:  SNF (31) Provider:  Bandana, Dresser, DO  Patient Care Team: [name], DO as PCP - [facility] ([facility]) [facility], [place] ([place])  Extended Emergency Contact Information Primary Emergency Contact: [place] of [place] Phone: [phone] Relation: Son Secondary Emergency Contact: Roberts,Lynn Address: [address]          [place], [place] [address] [name] of [place] Phone: [phone] Mobile Phone: [phone] Relation: Daughter  Code Status:  DNR Goals of care: Advanced Directive information Advanced Directives 06/18/2017  Does Patient Have a Medical Advance Directive? Yes  Type of Advance Directive Out of facility DNR (pink MOST or yellow form)  Does patient want to make changes to medical advance directive? No - Patient declined  Copy of [place] in Chart? -  Would patient like information on creating a medical advance directive? -  Pre-existing out of facility DNR order (yellow form or pink MOST form) Yellow form placed in chart (order not valid for inpatient use);Pink MOST form placed in chart (order not valid for inpatient use)     Chief Complaint  Patient presents with  . Medical Management of Chronic Issues    OPTUM - 1 month follow up    HPI:  Pt is a 84 y.o. female seen today for medical management of chronic diseases.  She c/o right poarietal and neck pain. She has dysuria. No other concerns. No f/c. No abdominal pain. No N/V. She is a poor historian due to dementia. Hx obtained from chart. A1c 6.2%  Hx Anemia - stable on iron daily. Hgb 9.5  PAF - rate controlled on coreg 3.125 mg daily; he takes ASA 81mg  daily .    Chronic respiratory failure - stable on prn [place] O2  dependent  Chronic pain syndrome 2/2 osteoarthritis and chronic HA - stable on baclofen 5 mg nightly for leg spasticity; neurontin 100 mg nightly for  neuropathic pain;  excederin migraine daily    Depression - mood stable on lexapro 10 mg daily   Chronic constipation/hx recurrent sigmoid volvulus - stable on colace 200 mg daily; linzess 145 mcg daily; senna twice daily; miralax daily.  Family has decided upon conservative treatment including repeat decompression by endoscope if needed.  Glaucoma - stable on trusopt to both eyes three times daily; travatan to both eyes nightly   CKD - stage 3. Cr 2.0  Hypokalemia - stable on Kdur 40 meq in the AM and 20 meq in the PM; Goal K 4.0 to prevent confusion. K 5.3  Hypertension - BP stable on norvasc 2.5 mg daily; coreg 3.125 mg daily   Chronic diastolic heart failure - stable on demadex 20 mg daily   Hx Duodenal ulcer/GERD - stable on prilosec 20 mg daily; zofran 8 mg every 8 hours as needed; pepcid 20 mg nightly  Allergic rhinitis - stable on flonase nightly and claritin 10 mg daily  Chronic/recurrent UTI - stable on AZO nightly and cranberry twice daily   Past Medical History:  Diagnosis Date  . Altered mental status   . Anemia   . Atrial fibrillation ([place])   . CKD (chronic kidney disease) stage 3, GFR 30-59 ml/min (HCC) 02/27/2015  . Constipation 02/27/2015  . Coronary artery disease   . Dementia   .  Depression   . Diabetes mellitus   . Edema of lower extremity 07/13/11   right leg more swollen than left leg  . Encephalopathy   . Enlarged heart   . Esophageal dysmotility 07/02/12  . GERD (gastroesophageal reflux disease)   . History of adenomatous polyp of colon 06/24/99  . Horseshoe kidney   . Hyperlipemia   . Hypertension   . Pancreatic lesion 05/22/11   no further workup  per PCP/family due to age  . Parkinson disease ([place])   . Peripheral neuropathy   . Sigmoid volvulus ([place]) 05/24/2013  . Thrombophlebitis   . TIA  (transient ischemic attack) 12/19/2012   Past Surgical History:  Procedure Laterality Date  . ABDOMINAL HYSTERECTOMY    . BOWEL DECOMPRESSION N/A 04/18/2016   Procedure: BOWEL DECOMPRESSION;  Surgeon: [name], MD;  Location: [facility];  Service: Endoscopy;  Laterality: N/A;  . BREAST SURGERY     2 benign tumors removed left breast  . CHOLECYSTECTOMY    . ESOPHAGEAL DILATION     several times by Dr. [name]  . ESOPHAGOGASTRODUODENOSCOPY (EGD) WITH ESOPHAGEAL DILATION N/A 08/01/2012   Procedure: ESOPHAGOGASTRODUODENOSCOPY (EGD) WITH ESOPHAGEAL DILATION;  Surgeon: [name], MD;  Location: [facility];  Service: Endoscopy;  Laterality: N/A;  with c-arm savory dilators  . ESOPHAGOGASTRODUODENOSCOPY (EGD) WITH PROPOFOL N/A 03/12/2015   Procedure: ESOPHAGOGASTRODUODENOSCOPY (EGD) WITH PROPOFOL;  Surgeon: [name], MD;  Location: [facility];  Service: Endoscopy;  Laterality: N/A;  . FLEXIBLE SIGMOIDOSCOPY N/A 05/24/2013   Procedure: FLEXIBLE SIGMOIDOSCOPY;  Surgeon: [name], MD;  Location: [facility];  Service: Endoscopy;  Laterality: N/A;  . FLEXIBLE SIGMOIDOSCOPY N/A 05/26/2013   Procedure:  flex with decompression of sigmoid volvulus;  Surgeon: [name], MD;  Location: [facility];  Service: Endoscopy;  Laterality: N/A;  . FLEXIBLE SIGMOIDOSCOPY N/A 12/20/2015   Procedure: FLEXIBLE SIGMOIDOSCOPY;  Surgeon: Milus Banister, MD;  Location: [facility];  Service: Endoscopy;  Laterality: N/A;  . FLEXIBLE SIGMOIDOSCOPY N/A 04/17/2016   Procedure: FLEXIBLE SIGMOIDOSCOPY;  Surgeon: Ladene Artist, MD;  Location: [facility];  Service: Endoscopy;  Laterality: N/A;  . FLEXIBLE SIGMOIDOSCOPY N/A 04/18/2016   Procedure: FLEXIBLE SIGMOIDOSCOPY;  Surgeon: [name], MD;  Location: [facility];  Service: Endoscopy;  Laterality: N/A;  . FOOT SURGERY     benign tumors from foot  . NOSE SURGERY    . SHOULDER SURGERY  2001   left clavicle excision and  acromioplasty  . TOTAL HIP ARTHROPLASTY      Allergies  Allergen Reactions  . Aspirin Other (See Comments)    G.I. Upset only    Outpatient Encounter Medications as of 06/18/2017  Medication Sig  . amLODipine (NORVASC) 2.5 MG tablet Take 2.5 mg by mouth every morning. Hold for BP < 100/60 or HR < 60  . aspirin EC 81 MG tablet Take 81 mg by mouth daily.  . baclofen (LIORESAL) 10 MG tablet Take 5 mg by mouth at bedtime.  . benzocaine-menthol (CHLORASEPTIC) 6-10 MG lozenge Take 1 lozenge by mouth as needed for sore throat.  . calcium carbonate (TUMS - DOSED IN MG ELEMENTAL CALCIUM) 500 MG chewable tablet Chew 2 tablets by mouth 3 (three) times daily.  . carvedilol (COREG) 3.125 MG tablet Take 3.125 mg by mouth daily.  . Cranberry 475 MG CAPS Take 1 capsule by mouth 2 (two) times daily.   . diclofenac sodium (VOLTAREN) 1 % GEL Apply 2 grams to neck topically three  times a day for pain  . docusate sodium (COLACE) 100 MG capsule Take 200 mg by mouth at bedtime.   . dorzolamide (TRUSOPT) 2 % ophthalmic solution Place 1 drop into both eyes 3 (three) times daily.  Marland Kitchen escitalopram (LEXAPRO) 10 MG tablet Take 10 mg by mouth daily.  . ferrous sulfate 220 (44 Fe) MG/5ML solution Give 5 ml by mouth in the evening for supplement  . fluticasone (FLONASE) 50 MCG/ACT nasal spray Place 2 sprays into both nostrils at bedtime.  . gabapentin (NEURONTIN) 100 MG capsule Take 100 mg by mouth at bedtime.  . Linaclotide (LINZESS) 145 MCG CAPS capsule Take 145 mcg by mouth daily before breakfast.   . loratadine (CLARITIN) 10 MG tablet Take 10 mg by mouth daily before breakfast.   . Multiple Vitamins-Minerals (CERTAGEN PO) Take 1 tablet by mouth every evening.   . Multiple Vitamins-Minerals (HAIR SKIN AND NAILS FORMULA) TABS Take by mouth. Give 2 tablets by mouth daily  . nystatin cream (MYCOSTATIN) Apply to buttocks topically two times a day for redness  . omeprazole (PRILOSEC) 20 MG capsule Take 20 mg by mouth  daily.  . ondansetron (ZOFRAN) 8 MG tablet Take 8 mg by mouth every 8 (eight) hours as needed for nausea or vomiting.  Marland Kitchen Phenazopyridine HCl (AZO TABS PO) Take 1 tablet by mouth at bedtime.  . polyethylene glycol (MIRALAX / GLYCOLAX) packet Take 17 g by mouth daily.   . potassium chloride SA (K-DUR,KLOR-CON) 20 MEQ tablet Take 20-40 mEq by mouth 2 (two) times daily. Give 40 meq (2 tablets) in the morning and Give 20 meq (1 tablet) by mouth in the afternoon.  . senna (SENOKOT) 8.6 MG TABS tablet Take 1 tablet (8.6 mg total) by mouth 2 (two) times daily.  . sodium chloride (OCEAN) 0.65 % SOLN nasal spray Place 1 spray into both nostrils as directed. Four times daily And every 24 hours as needed to moisturize nasal passages.  . torsemide (DEMADEX) 20 MG tablet Take 20 mg by mouth daily.  . Travoprost, BAK Free, (TRAVATAN) 0.004 % SOLN ophthalmic solution Place 1 drop into both eyes daily at 8 pm.   . vitamin B-12 (CYANOCOBALAMIN) 500 MCG tablet Take 500 mcg by mouth every evening.   . [DISCONTINUED] aspirin-acetaminophen-caffeine (EXCEDRIN MIGRAINE) 250-250-65 MG tablet Take 1 tablet by mouth daily.  . [DISCONTINUED] famotidine (PEPCID) 20 MG tablet Take 20 mg by mouth at bedtime.  . [DISCONTINUED] Nutritional Supplements (NUTRITIONAL SUPPLEMENT PO) Med Pass - Give 60cc by mouth two times daily for weight loss   No facility-administered encounter medications on file as of 06/18/2017.     Review of Systems  Immunization History  Administered Date(s) Administered  . Influenza-Unspecified 07/13/2015, 03/09/2016, 04/02/2017  . PPD Test 07/28/2013, 02/18/2016, 02/25/2016   Pertinent  Health Maintenance Due  Topic Date Due  . URINE MICROALBUMIN  08/10/2017 (Originally 10/08/1934)  . HEMOGLOBIN A1C  10/17/2017 (Originally 10/16/2016)  . FOOT EXAM  01/10/2018 (Originally 01/06/2017)  . PNA vac Low Risk Adult (1 of 2 - PCV13) 11/18/2021 (Originally 10/09/1991)  . OPHTHALMOLOGY EXAM  09/18/2017  .  INFLUENZA VACCINE  Completed  . DEXA SCAN  Discontinued   Fall Risk  11/23/2016  Falls in the past year? No   Functional Status Survey:    Vitals:   06/18/17 1406  BP: 122/68  Pulse: 80  Resp: 18  Temp: 98 F (36.7 C)  SpO2: 97%  Weight: 150 lb 9.6 oz (68.3 kg)  Height: 5\' 7"  (  1.702 m)   Body mass index is 23.59 kg/m. Physical Exam  Constitutional: She is oriented to person, place, and time. She appears well-developed and well-nourished.  Sitting in w/c in NAD, frail appearing   HENT:  Mouth/Throat: Oropharynx is clear and moist. No oropharyngeal exudate.  MMM; no oral thrush  Eyes: Pupils are equal, round, and reactive to light. No scleral icterus.  Neck: Neck supple. Muscular tenderness present. Carotid bruit is not present. Decreased range of motion present. No tracheal deviation present. No thyromegaly present.    Cardiovascular: Normal rate, regular rhythm and intact distal pulses. Exam reveals no gallop and no friction rub.  Murmur (1/6 SEM ) heard. Trace LE edema b/l. No calf TTP. B/l varicose veins, soft, NT  Pulmonary/Chest: Effort normal and breath sounds normal. No stridor. No respiratory distress. She has no wheezes. She has no rales.  Abdominal: Soft. Normal appearance and bowel sounds are normal. She exhibits distension. She exhibits no mass. There is no hepatomegaly. There is tenderness. There is no rigidity, no rebound and no guarding. No hernia.  Lymphadenopathy:    She has no cervical adenopathy.  Neurological: She is alert and oriented to person, place, and time. She has normal reflexes.  Skin: Skin is warm and dry. No rash noted.  Psychiatric: She has a normal mood and affect. Her behavior is normal. Judgment and thought content normal.    Labs reviewed: Recent Labs    05/09/17 1705 05/22/17 06/12/17  NA 139 146 145  K 4.1 4.7 5.3  CL 113*  --   --   CO2 22  --   --   GLUCOSE 133*  --   --   BUN 25* 29* 32*  CREATININE 1.51* 1.5* 2.0*  CALCIUM  8.8*  --   --    Recent Labs    08/12/16 05/08/17 05/09/17 1705  AST 12* 12*  12* 19  ALT 7 6*  6* 11*  ALKPHOS 90 104  104 93  BILITOT  --   --  0.6  PROT  --   --  6.9  ALBUMIN  --   --  3.6   Recent Labs    05/08/17 05/09/17 1705 05/22/17 05/29/17  WBC 4.2 4.8 3.4 4.5  NEUTROABS 2  --  1 2  HGB 9.4* 9.5* 9.3* 9.5*  HCT 29* 29.4* 28* 28*  MCV  --  105.8*  --   --   PLT 97* 95* 101* 90*   Lab Results  Component Value Date   TSH 2.572 04/18/2016   Lab Results  Component Value Date   HGBA1C 6.9 (H) 04/18/2016   Lab Results  Component Value Date   CHOL 133 11/24/2016   HDL 41 11/24/2016   LDLCALC 78 11/24/2016   TRIG 75 11/24/2016   CHOLHDL 2.3 05/23/2011    Significant Diagnostic Results in last 30 days:  No results found.  Assessment/Plan   ICD-10-CM   1. Dysuria R30.0   2. Abnormal urinalysis R82.90   3. Chronic atrial fibrillation (HCC) I48.2   4. Primary osteoarthritis involving multiple joints M15.0   5. Other chronic pain G89.29   6. TMJ (temporomandibular joint syndrome) M26.609   7. Dementia associated with other underlying disease without behavioral disturbance F02.80    Cont current med as ordered  PT/OT/ST as indicated  T/c neurology eval for TMJ pain  Will follow    Labs/tests ordered: none    Tracey Hermance S. Eulas Post, D. O., F. [place]  [facility] [address] [place], [place] [address] [phone] Cell (Monday-Friday 8 AM - 5 PM) [phone] After 5 PM and follow prompts

## 2017-06-28 ENCOUNTER — Other Ambulatory Visit: Payer: Self-pay

## 2017-06-28 ENCOUNTER — Encounter (HOSPITAL_COMMUNITY): Payer: Self-pay | Admitting: Internal Medicine

## 2017-06-28 ENCOUNTER — Emergency Department (HOSPITAL_COMMUNITY): Payer: Medicare Other

## 2017-06-28 ENCOUNTER — Inpatient Hospital Stay (HOSPITAL_COMMUNITY)
Admission: EM | Admit: 2017-06-28 | Discharge: 2017-07-08 | DRG: 683 | Disposition: A | Discharge status: Other Acute Inpatient Hospital | Payer: Medicare Other | Attending: Family Medicine | Admitting: Family Medicine

## 2017-06-28 DIAGNOSIS — I130 Hypertensive heart and chronic kidney disease with heart failure and stage 1 through stage 4 chronic kidney disease, or unspecified chronic kidney disease: Secondary | ICD-10-CM | POA: Diagnosis present

## 2017-06-28 DIAGNOSIS — R140 Abdominal distension (gaseous): Secondary | ICD-10-CM

## 2017-06-28 DIAGNOSIS — R1084 Generalized abdominal pain: Secondary | ICD-10-CM | POA: Diagnosis not present

## 2017-06-28 DIAGNOSIS — I5032 Chronic diastolic (congestive) heart failure: Secondary | ICD-10-CM | POA: Diagnosis present

## 2017-06-28 DIAGNOSIS — R5381 Other malaise: Secondary | ICD-10-CM | POA: Diagnosis not present

## 2017-06-28 DIAGNOSIS — M62838 Other muscle spasm: Secondary | ICD-10-CM | POA: Diagnosis not present

## 2017-06-28 DIAGNOSIS — N179 Acute kidney failure, unspecified: Secondary | ICD-10-CM | POA: Diagnosis present

## 2017-06-28 DIAGNOSIS — F329 Major depressive disorder, single episode, unspecified: Secondary | ICD-10-CM | POA: Diagnosis present

## 2017-06-28 DIAGNOSIS — K562 Volvulus: Secondary | ICD-10-CM

## 2017-06-28 DIAGNOSIS — N3001 Acute cystitis with hematuria: Secondary | ICD-10-CM

## 2017-06-28 DIAGNOSIS — N183 Chronic kidney disease, stage 3 (moderate): Secondary | ICD-10-CM | POA: Diagnosis present

## 2017-06-28 DIAGNOSIS — K5909 Other constipation: Secondary | ICD-10-CM | POA: Diagnosis present

## 2017-06-28 DIAGNOSIS — I2510 Atherosclerotic heart disease of native coronary artery without angina pectoris: Secondary | ICD-10-CM | POA: Diagnosis present

## 2017-06-28 DIAGNOSIS — Z8673 Personal history of transient ischemic attack (TIA), and cerebral infarction without residual deficits: Secondary | ICD-10-CM

## 2017-06-28 DIAGNOSIS — E1142 Type 2 diabetes mellitus with diabetic polyneuropathy: Secondary | ICD-10-CM | POA: Diagnosis present

## 2017-06-28 DIAGNOSIS — N3000 Acute cystitis without hematuria: Secondary | ICD-10-CM | POA: Diagnosis not present

## 2017-06-28 DIAGNOSIS — R5383 Other fatigue: Secondary | ICD-10-CM | POA: Diagnosis not present

## 2017-06-28 DIAGNOSIS — K219 Gastro-esophageal reflux disease without esophagitis: Secondary | ICD-10-CM | POA: Diagnosis present

## 2017-06-28 DIAGNOSIS — F0390 Unspecified dementia without behavioral disturbance: Secondary | ICD-10-CM | POA: Diagnosis present

## 2017-06-28 DIAGNOSIS — M25552 Pain in left hip: Secondary | ICD-10-CM | POA: Diagnosis not present

## 2017-06-28 DIAGNOSIS — E1122 Type 2 diabetes mellitus with diabetic chronic kidney disease: Secondary | ICD-10-CM | POA: Diagnosis present

## 2017-06-28 DIAGNOSIS — R531 Weakness: Secondary | ICD-10-CM

## 2017-06-28 DIAGNOSIS — I4891 Unspecified atrial fibrillation: Secondary | ICD-10-CM | POA: Diagnosis present

## 2017-06-28 DIAGNOSIS — M7989 Other specified soft tissue disorders: Secondary | ICD-10-CM | POA: Diagnosis not present

## 2017-06-28 DIAGNOSIS — G20 Parkinson's disease: Secondary | ICD-10-CM | POA: Diagnosis present

## 2017-06-28 DIAGNOSIS — K567 Ileus, unspecified: Secondary | ICD-10-CM

## 2017-06-28 DIAGNOSIS — R001 Bradycardia, unspecified: Secondary | ICD-10-CM | POA: Diagnosis present

## 2017-06-28 DIAGNOSIS — Z886 Allergy status to analgesic agent status: Secondary | ICD-10-CM

## 2017-06-28 DIAGNOSIS — N390 Urinary tract infection, site not specified: Secondary | ICD-10-CM | POA: Diagnosis present

## 2017-06-28 DIAGNOSIS — K9189 Other postprocedural complications and disorders of digestive system: Secondary | ICD-10-CM | POA: Diagnosis not present

## 2017-06-28 DIAGNOSIS — Z1612 Extended spectrum beta lactamase (ESBL) resistance: Secondary | ICD-10-CM | POA: Diagnosis present

## 2017-06-28 DIAGNOSIS — R197 Diarrhea, unspecified: Secondary | ICD-10-CM | POA: Diagnosis present

## 2017-06-28 DIAGNOSIS — E872 Acidosis: Secondary | ICD-10-CM | POA: Diagnosis present

## 2017-06-28 DIAGNOSIS — Z7982 Long term (current) use of aspirin: Secondary | ICD-10-CM

## 2017-06-28 DIAGNOSIS — E785 Hyperlipidemia, unspecified: Secondary | ICD-10-CM | POA: Diagnosis present

## 2017-06-28 DIAGNOSIS — Z66 Do not resuscitate: Secondary | ICD-10-CM | POA: Diagnosis present

## 2017-06-28 DIAGNOSIS — E876 Hypokalemia: Secondary | ICD-10-CM | POA: Diagnosis not present

## 2017-06-28 DIAGNOSIS — M25559 Pain in unspecified hip: Secondary | ICD-10-CM

## 2017-06-28 DIAGNOSIS — K224 Dyskinesia of esophagus: Secondary | ICD-10-CM | POA: Diagnosis present

## 2017-06-28 DIAGNOSIS — Z96642 Presence of left artificial hip joint: Secondary | ICD-10-CM | POA: Diagnosis present

## 2017-06-28 DIAGNOSIS — Q631 Lobulated, fused and horseshoe kidney: Secondary | ICD-10-CM | POA: Diagnosis not present

## 2017-06-28 DIAGNOSIS — Z79899 Other long term (current) drug therapy: Secondary | ICD-10-CM

## 2017-06-28 LAB — COMPREHENSIVE METABOLIC PANEL
ALK PHOS: 90 U/L (ref 38–126)
ALT: 14 U/L (ref 14–54)
AST: 28 U/L (ref 15–41)
Albumin: 3.6 g/dL (ref 3.5–5.0)
Anion gap: 7 (ref 5–15)
BILIRUBIN TOTAL: 0.5 mg/dL (ref 0.3–1.2)
BUN: 32 mg/dL — ABNORMAL HIGH (ref 6–20)
CALCIUM: 8.6 mg/dL — AB (ref 8.9–10.3)
CO2: 20 mmol/L — ABNORMAL LOW (ref 22–32)
Chloride: 111 mmol/L (ref 101–111)
Creatinine, Ser: 2.21 mg/dL — ABNORMAL HIGH (ref 0.44–1.00)
GFR calc non Af Amer: 18 mL/min — ABNORMAL LOW (ref >60)
GFR, EST AFRICAN AMERICAN: 21 mL/min — AB (ref >60)
Glucose, Bld: 146 mg/dL — ABNORMAL HIGH (ref 65–99)
Potassium: 4.2 mmol/L (ref 3.5–5.1)
Sodium: 138 mmol/L (ref 135–145)
TOTAL PROTEIN: 7.3 g/dL (ref 6.5–8.1)

## 2017-06-28 LAB — CBC WITH DIFFERENTIAL/PLATELET
BASOS ABS: 0 10*3/uL (ref 0.0–0.1)
BASOS PCT: 1 %
EOS ABS: 0.2 10*3/uL (ref 0.0–0.7)
Eosinophils Relative: 5 %
HEMATOCRIT: 31.7 % — AB (ref 36.0–46.0)
Hemoglobin: 10.4 g/dL — ABNORMAL LOW (ref 12.0–15.0)
Lymphocytes Relative: 36 %
Lymphs Abs: 1.5 10*3/uL (ref 0.7–4.0)
MCH: 34.8 pg — ABNORMAL HIGH (ref 26.0–34.0)
MCHC: 32.8 g/dL (ref 30.0–36.0)
MCV: 106 fL — ABNORMAL HIGH (ref 78.0–100.0)
MONO ABS: 0.6 10*3/uL (ref 0.1–1.0)
Monocytes Relative: 14 %
NEUTROS ABS: 1.8 10*3/uL (ref 1.7–7.7)
NEUTROS PCT: 44 %
PLATELETS: 88 10*3/uL — AB (ref 150–400)
RBC: 2.99 MIL/uL — ABNORMAL LOW (ref 3.87–5.11)
RDW: 14.2 % (ref 11.5–15.5)
WBC: 4.1 10*3/uL (ref 4.0–10.5)

## 2017-06-28 LAB — C DIFFICILE QUICK SCREEN W PCR REFLEX
C DIFFICILE (CDIFF) INTERP: NOT DETECTED
C Diff antigen: NEGATIVE
C Diff toxin: NEGATIVE

## 2017-06-28 LAB — I-STAT CHEM 8, ED
BUN: 31 mg/dL — ABNORMAL HIGH (ref 6–20)
Calcium, Ion: 1.23 mmol/L (ref 1.15–1.40)
Chloride: 110 mmol/L (ref 101–111)
Creatinine, Ser: 2.3 mg/dL — ABNORMAL HIGH (ref 0.44–1.00)
GLUCOSE: 141 mg/dL — AB (ref 65–99)
HEMATOCRIT: 31 % — AB (ref 36.0–46.0)
HEMOGLOBIN: 10.5 g/dL — AB (ref 12.0–15.0)
POTASSIUM: 4.2 mmol/L (ref 3.5–5.1)
Sodium: 143 mmol/L (ref 135–145)
TCO2: 20 mmol/L — AB (ref 22–32)

## 2017-06-28 LAB — MRSA PCR SCREENING: MRSA BY PCR: NEGATIVE

## 2017-06-28 LAB — I-STAT CG4 LACTIC ACID, ED: LACTIC ACID, VENOUS: 0.77 mmol/L (ref 0.5–1.9)

## 2017-06-28 LAB — I-STAT TROPONIN, ED: TROPONIN I, POC: 0.03 ng/mL (ref 0.00–0.08)

## 2017-06-28 LAB — URINALYSIS, ROUTINE W REFLEX MICROSCOPIC
Bilirubin Urine: NEGATIVE
GLUCOSE, UA: NEGATIVE mg/dL
Ketones, ur: NEGATIVE mg/dL
NITRITE: NEGATIVE
PH: 5 (ref 5.0–8.0)
Protein, ur: 100 mg/dL — AB
SPECIFIC GRAVITY, URINE: 1.011 (ref 1.005–1.030)

## 2017-06-28 LAB — POC OCCULT BLOOD, ED: Fecal Occult Bld: POSITIVE — AB

## 2017-06-28 MED ORDER — CARVEDILOL 3.125 MG PO TABS
3.1250 mg | ORAL_TABLET | Freq: Every day | ORAL | Status: DC
  Administered 2017-06-29: 3.125 mg via ORAL
  Filled 2017-06-28: qty 1

## 2017-06-28 MED ORDER — SODIUM CHLORIDE 0.9 % IV SOLN
INTRAVENOUS | Status: DC
  Administered 2017-06-28 – 2017-07-06 (×6): via INTRAVENOUS

## 2017-06-28 MED ORDER — CYANOCOBALAMIN 500 MCG PO TABS
500.0000 ug | ORAL_TABLET | Freq: Every evening | ORAL | Status: DC
  Administered 2017-06-29 – 2017-07-07 (×8): 500 ug via ORAL
  Filled 2017-06-28 (×10): qty 1

## 2017-06-28 MED ORDER — ONDANSETRON HCL 4 MG PO TABS: 4.0000 mg | ORAL_TABLET | Freq: Four times a day (QID) | ORAL | Status: DC | PRN

## 2017-06-28 MED ORDER — ESCITALOPRAM OXALATE 10 MG PO TABS
10.0000 mg | ORAL_TABLET | Freq: Every day | ORAL | Status: DC
  Administered 2017-06-29 – 2017-07-08 (×10): 10 mg via ORAL
  Filled 2017-06-28 (×10): qty 1

## 2017-06-28 MED ORDER — IOPAMIDOL (ISOVUE-300) INJECTION 61%
INTRAVENOUS | Status: AC
  Filled 2017-06-28: qty 100

## 2017-06-28 MED ORDER — LORATADINE 10 MG PO TABS
10.0000 mg | ORAL_TABLET | Freq: Every day | ORAL | Status: DC
  Administered 2017-06-29 – 2017-07-08 (×10): 10 mg via ORAL
  Filled 2017-06-28 (×9): qty 1

## 2017-06-28 MED ORDER — AMLODIPINE BESYLATE 5 MG PO TABS
2.5000 mg | ORAL_TABLET | Freq: Every day | ORAL | Status: DC
  Administered 2017-06-29 – 2017-07-08 (×10): 2.5 mg via ORAL
  Filled 2017-06-28 (×10): qty 1

## 2017-06-28 MED ORDER — ONDANSETRON HCL 4 MG/2ML IJ SOLN: 4.0000 mg | Freq: Four times a day (QID) | INTRAMUSCULAR | Status: DC | PRN

## 2017-06-28 MED ORDER — BACLOFEN 10 MG PO TABS
5.0000 mg | ORAL_TABLET | Freq: Every day | ORAL | Status: DC
  Administered 2017-06-28 – 2017-07-07 (×10): 5 mg via ORAL
  Filled 2017-06-28 (×10): qty 1

## 2017-06-28 MED ORDER — PIPERACILLIN-TAZOBACTAM IN DEX 2-0.25 GM/50ML IV SOLN
2.2500 g | Freq: Three times a day (TID) | INTRAVENOUS | Status: DC
  Administered 2017-06-28 – 2017-06-30 (×5): 2.25 g via INTRAVENOUS
  Filled 2017-06-28 (×6): qty 50

## 2017-06-28 MED ORDER — FAMOTIDINE 20 MG PO TABS
20.0000 mg | ORAL_TABLET | Freq: Every day | ORAL | Status: DC
  Administered 2017-06-28 – 2017-07-07 (×10): 20 mg via ORAL
  Filled 2017-06-28 (×10): qty 1

## 2017-06-28 MED ORDER — DORZOLAMIDE HCL 2 % OP SOLN
1.0000 [drp] | Freq: Three times a day (TID) | OPHTHALMIC | Status: DC
  Administered 2017-06-28 – 2017-07-08 (×30): 1 [drp] via OPHTHALMIC
  Filled 2017-06-28: qty 10

## 2017-06-28 MED ORDER — LATANOPROST 0.005 % OP SOLN
1.0000 [drp] | Freq: Every day | OPHTHALMIC | Status: DC
  Administered 2017-06-28 – 2017-07-07 (×10): 1 [drp] via OPHTHALMIC
  Filled 2017-06-28: qty 2.5

## 2017-06-28 MED ORDER — FLUTICASONE PROPIONATE 50 MCG/ACT NA SUSP
2.0000 | Freq: Every day | NASAL | Status: DC
  Administered 2017-06-28 – 2017-07-07 (×10): 2 via NASAL
  Filled 2017-06-28: qty 16

## 2017-06-28 MED ORDER — GABAPENTIN 100 MG PO CAPS
100.0000 mg | ORAL_CAPSULE | Freq: Every day | ORAL | Status: DC
  Administered 2017-06-28 – 2017-07-07 (×10): 100 mg via ORAL
  Filled 2017-06-28 (×10): qty 1

## 2017-06-28 MED ORDER — SODIUM CHLORIDE 0.9 % IV BOLUS (SEPSIS)
500.0000 mL | Freq: Once | INTRAVENOUS | Status: AC
  Administered 2017-06-28: 500 mL via INTRAVENOUS

## 2017-06-28 MED ORDER — PIPERACILLIN-TAZOBACTAM 3.375 G IVPB 30 MIN
3.3750 g | Freq: Once | INTRAVENOUS | Status: AC
  Administered 2017-06-28: 3.375 g via INTRAVENOUS
  Filled 2017-06-28: qty 50

## 2017-06-28 MED ORDER — ACETAMINOPHEN 325 MG PO TABS
650.0000 mg | ORAL_TABLET | Freq: Every day | ORAL | Status: DC | PRN
  Administered 2017-06-29: 650 mg via ORAL
  Filled 2017-06-28 (×2): qty 2

## 2017-06-28 NOTE — H&P (Signed)
TRH H&P    Patient Demographics:    Maria Christian, is a 84 y.o. female  MRN: [id number]  DOB - 03/09/1925  Admit Date - 06/28/2017  Referring MD/NP/PA: Valere Dross  Outpatient Primary MD for the patient is [name], DO  Patient coming from: skilled nursing facility  CC - diarrhea    HPI:    Maria Christian  is a 84 y.o. female, with history of atrial fibrillation, not on chronic anticoagulation, take aspirin for anticoagulation, depression, chronic respiratory failure, chronic constipation with history of recurrent sigmoid volvulus, hypertension, CKD stage III, chronic diastolic heart failure who came to hospital with chief complaints of diarrhea. Initially patient was sent from the skilled facility for concern for SBO as x-ray of the abdomen showed findings consistent with small bowel obstruction. Patient complains of abdominal pain with watery diarrhea for past three days. Also noted intermittent bright red blood in the stool. She has had poor PO intake. Denies any vomiting. Complains of dysuria denies chest pain, no shortness of breath. Patient has been on oral Keflex for UTI. In the ED, stool for C. diff obtained UA was found to be abnormal and patient started on Zosyn.    Review of systems:     All other systems reviewed and are negative.   With Past History of the following :    Past Medical History:  Diagnosis Date  . Altered mental status   . Anemia   . Atrial fibrillation ([place])   . CKD (chronic kidney disease) stage 3, GFR 30-59 ml/min (HCC) 02/27/2015  . Constipation 02/27/2015  . Coronary artery disease   . Dementia   . Depression   . Diabetes mellitus   . Edema of lower extremity 07/13/11   right leg more swollen than left leg  . Encephalopathy   . Enlarged heart   . Esophageal dysmotility 07/02/12  . GERD (gastroesophageal reflux disease)   . History of adenomatous polyp of colon  06/24/99  . Horseshoe kidney   . Hyperlipemia   . Hypertension   . Pancreatic lesion 05/22/11   no further workup  per PCP/family due to age  . Parkinson disease ([place])   . Peripheral neuropathy   . Sigmoid volvulus ([place]) 05/24/2013  . Thrombophlebitis   . TIA (transient ischemic attack) 12/19/2012      Past Surgical History:  Procedure Laterality Date  . ABDOMINAL HYSTERECTOMY    . BOWEL DECOMPRESSION N/A 04/18/2016   Procedure: BOWEL DECOMPRESSION;  Surgeon: [name], MD;  Location: [facility];  Service: Endoscopy;  Laterality: N/A;  . BREAST SURGERY     2 benign tumors removed left breast  . CHOLECYSTECTOMY    . ESOPHAGEAL DILATION     several times by Dr. [name]  . ESOPHAGOGASTRODUODENOSCOPY (EGD) WITH ESOPHAGEAL DILATION N/A 08/01/2012   Procedure: ESOPHAGOGASTRODUODENOSCOPY (EGD) WITH ESOPHAGEAL DILATION;  Surgeon: [name], MD;  Location: [facility];  Service: Endoscopy;  Laterality: N/A;  with c-arm savory dilators  . ESOPHAGOGASTRODUODENOSCOPY (EGD) WITH PROPOFOL N/A 03/12/2015   Procedure: ESOPHAGOGASTRODUODENOSCOPY (EGD) WITH  PROPOFOL;  Surgeon: [name], MD;  Location: [facility];  Service: Endoscopy;  Laterality: N/A;  . FLEXIBLE SIGMOIDOSCOPY N/A 05/24/2013   Procedure: FLEXIBLE SIGMOIDOSCOPY;  Surgeon: [name], MD;  Location: [facility];  Service: Endoscopy;  Laterality: N/A;  . FLEXIBLE SIGMOIDOSCOPY N/A 05/26/2013   Procedure:  flex with decompression of sigmoid volvulus;  Surgeon: [name], MD;  Location: [facility];  Service: Endoscopy;  Laterality: N/A;  . FLEXIBLE SIGMOIDOSCOPY N/A 12/20/2015   Procedure: FLEXIBLE SIGMOIDOSCOPY;  Surgeon: Milus Banister, MD;  Location: [facility];  Service: Endoscopy;  Laterality: N/A;  . FLEXIBLE SIGMOIDOSCOPY N/A 04/17/2016   Procedure: FLEXIBLE SIGMOIDOSCOPY;  Surgeon: Ladene Artist, MD;  Location: [facility];  Service: Endoscopy;  Laterality: N/A;  . FLEXIBLE SIGMOIDOSCOPY N/A  04/18/2016   Procedure: FLEXIBLE SIGMOIDOSCOPY;  Surgeon: [name], MD;  Location: [facility];  Service: Endoscopy;  Laterality: N/A;  . FOOT SURGERY     benign tumors from foot  . NOSE SURGERY    . SHOULDER SURGERY  2001   left clavicle excision and acromioplasty  . TOTAL HIP ARTHROPLASTY        Social History:      Social History   Tobacco Use  . Smoking status: Never Smoker  . Smokeless tobacco: Never Used  Substance Use Topics  . Alcohol use: No       Family History :     Family History  Problem Relation Age of Onset  . Cancer Unknown   . Heart disease Unknown       Home Medications:   Prior to Admission medications   Medication Sig Start Date End Date Taking? Authorizing Provider  acetaminophen (TYLENOL) 325 MG tablet Take 650 mg by mouth daily as needed (OSTEOARTHRITIS).   Yes [provider]  amLODipine (NORVASC) 2.5 MG tablet Take 2.5 mg by mouth every morning. Hold for BP < 100/60 or HR < 60   Yes [provider]  aspirin EC 81 MG tablet Take 81 mg by mouth daily.   Yes [provider]  aspirin-acetaminophen-caffeine (EXCEDRIN MIGRAINE) [phone] MG tablet Take 1 tablet by mouth daily.   Yes [provider]  baclofen (LIORESAL) 10 MG tablet Take 5 mg by mouth at bedtime.   Yes [provider]  calcium carbonate (TUMS - DOSED IN MG ELEMENTAL CALCIUM) 500 MG chewable tablet Chew 2 tablets by mouth 3 (three) times daily.   Yes [provider]  carvedilol (COREG) 3.125 MG tablet Take 3.125 mg by mouth daily.   Yes [provider]  Cranberry 475 MG CAPS Take 1 capsule by mouth 2 (two) times daily.    Yes [provider]  diclofenac sodium (VOLTAREN) 1 % GEL Apply 2 grams to neck topically three times a day for pain 05/15/17  Yes [provider]  docusate sodium (COLACE) 100 MG capsule Take 200 mg by mouth at bedtime.    Yes [provider]  dorzolamide (TRUSOPT) 2 %  ophthalmic solution Place 1 drop into both eyes 3 (three) times daily.   Yes [provider]  escitalopram (LEXAPRO) 10 MG tablet Take 10 mg by mouth daily.   Yes [provider]  famotidine (PEPCID) 20 MG tablet Take 20 mg by mouth at bedtime. 04/05/17  Yes [provider]  ferrous sulfate 220 (44 Fe) MG/5ML solution Give 5 ml by mouth in the evening for supplement   Yes [provider]  fluticasone (FLONASE) 50 MCG/ACT  nasal spray Place 2 sprays into both nostrils at bedtime.   Yes [provider]  gabapentin (NEURONTIN) 100 MG capsule Take 100 mg by mouth at bedtime.   Yes [provider]  Linaclotide (LINZESS) 145 MCG CAPS capsule Take 145 mcg by mouth daily before breakfast.    Yes [provider]  loratadine (CLARITIN) 10 MG tablet Take 10 mg by mouth daily before breakfast.    Yes [provider]  Multiple Vitamins-Minerals (CERTAGEN PO) Take 1 tablet by mouth every evening.    Yes [provider]  Multiple Vitamins-Minerals (HAIR SKIN AND NAILS FORMULA) TABS Take by mouth. Give 2 tablets by mouth daily   Yes [provider]  nystatin cream (MYCOSTATIN) Apply to buttocks topically two times a day for redness   Yes [provider]  omeprazole (PRILOSEC) 20 MG capsule Take 20 mg by mouth daily.   Yes [provider]  Phenazopyridine HCl (AZO TABS PO) Take 1 tablet by mouth at bedtime.   Yes [provider]  polyethylene glycol (MIRALAX / GLYCOLAX) packet Take 17 g by mouth daily.    Yes [provider]  potassium chloride SA (K-DUR,KLOR-CON) 20 MEQ tablet Take 20-40 mEq by mouth 2 (two) times daily. Give 40 meq (2 tablets) in the morning and Give 20 meq (1 tablet) by mouth in the afternoon. 05/06/17  Yes [provider]  senna (SENOKOT) 8.6 MG TABS tablet Take 1 tablet (8.6 mg total) by mouth 2 (two) times daily. 01/05/14  Yes Barton Dubois, MD  torsemide (DEMADEX)  20 MG tablet Take 20 mg by mouth daily.   Yes [provider]  Travoprost, BAK Free, (TRAVATAN) 0.004 % SOLN ophthalmic solution Place 1 drop into both eyes daily at 8 pm.    Yes [provider]  vitamin B-12 (CYANOCOBALAMIN) 500 MCG tablet Take 500 mcg by mouth every evening.    Yes [provider]  benzocaine-menthol (CHLORASEPTIC) 6-10 MG lozenge Take 1 lozenge by mouth as needed for sore throat.    [provider]  cephALEXin (KEFLEX) 500 MG capsule Take 500 mg by mouth 2 (two) times daily. FOR UTI START DAY 06/18/17 - END DATE 06/24/17    [provider]  ondansetron (ZOFRAN) 8 MG tablet Take 8 mg by mouth every 8 (eight) hours as needed for nausea or vomiting.    [provider]  sodium chloride (OCEAN) 0.65 % SOLN nasal spray Place 1 spray into both nostrils as directed. Four times daily And every 24 hours as needed to moisturize nasal passages.    [provider]     Allergies:     Allergies  Allergen Reactions  . Aspirin Other (See Comments)    G.I. Upset only     Physical Exam:   Vitals  Blood pressure (!) 119/59, pulse (!) 47, temperature 98.3 F (36.8 C), temperature source Oral, resp. rate 13, SpO2 99 %.  1.  General: appears in no acute distress  2. Psychiatric:  Intact judgement and  insight, awake alert, oriented x 3.  3. Neurologic: No focal neurological deficits, all cranial nerves intact.Strength 5/5 all 4 extremities, sensation intact all 4 extremities, plantars down going.  4. Eyes :  anicteric sclerae, moist conjunctivae with no lid lag. PERRLA.  5. ENMT:  Oropharynx clear with moist mucous membranes and good dentition  6. Neck:  supple, no cervical lymphadenopathy appriciated, No thyromegaly  7. Respiratory : Normal respiratory effort, good air movement bilaterally,clear to  auscultation bilaterally  8. Cardiovascular : RRR, no gallops, rubs or murmurs, no leg edema  9.  Gastrointestinal:  Positive bowel sounds, abdomen soft, non-tender to palpation,no hepatosplenomegaly, no rigidity or guarding       10. Skin:  No cyanosis, normal texture and turgor, no rash, lesions or ulcers  11.Musculoskeletal:  Good muscle tone,  joints appear normal , no effusions,  normal range of motion    Data Review:    CBC Recent Labs  Lab 06/28/17 1213 06/28/17 1335  WBC 4.1  --   HGB 10.4* 10.5*  HCT 31.7* 31.0*  PLT 88*  --   MCV 106.0*  --   MCH 34.8*  --   MCHC 32.8  --   RDW 14.2  --   LYMPHSABS 1.5  --   MONOABS 0.6  --   EOSABS 0.2  --   BASOSABS 0.0  --    ------------------------------------------------------------------------------------------------------------------  Chemistries  Recent Labs  Lab 06/28/17 1213 06/28/17 1335  NA 138 143  K 4.2 4.2  CL 111 110  CO2 20*  --   GLUCOSE 146* 141*  BUN 32* 31*  CREATININE 2.21* 2.30*  CALCIUM 8.6*  --   AST 28  --   ALT 14  --   ALKPHOS 90  --   BILITOT 0.5  --    ------------------------------------------------------------------------------------------------------------------  ------------------------------------------------------------------------------------------------------------------ GFR: Estimated Creatinine Clearance: 15.8 mL/min (A) (by C-G formula based on SCr of 2.3 mg/dL (H)). Liver Function Tests: Recent Labs  Lab 06/28/17 1213  AST 28  ALT 14  ALKPHOS 90  BILITOT 0.5  PROT 7.3  ALBUMIN 3.6     --------------------------------------------------------------------------------------------------------------- Urine analysis:    Component Value Date/Time   COLORURINE YELLOW 06/28/2017 1213   APPEARANCEUR TURBID (A) 06/28/2017 1213   LABSPEC 1.011 06/28/2017 1213   PHURINE 5.0 06/28/2017 1213   GLUCOSEU NEGATIVE 06/28/2017 1213   HGBUR MODERATE (A) 06/28/2017 1213   BILIRUBINUR NEGATIVE 06/28/2017 1213   KETONESUR NEGATIVE 06/28/2017 1213   PROTEINUR 100 (A)  06/28/2017 1213   UROBILINOGEN 0.2 03/09/2015 1352   NITRITE NEGATIVE 06/28/2017 1213   LEUKOCYTESUR MODERATE (A) 06/28/2017 1213      Imaging Results:    Ct Abdomen Pelvis Wo Contrast  Result Date: 06/28/2017 CLINICAL DATA:  Abdominal pain. Plain film of the abdomen worrisome for small bowel obstruction. EXAM: CT ABDOMEN AND PELVIS WITHOUT CONTRAST TECHNIQUE: Multidetector CT imaging of the abdomen and pelvis was performed following the standard protocol without IV contrast. COMPARISON:  CT abdomen and pelvis 05/09/2017 and 04/17/2016. FINDINGS: Lower chest: There is marked cardiomegaly. No pericardial or pleural effusion. Mild dependent atelectasis in the lung bases. Hepatobiliary: No focal liver abnormality is seen. Status post cholecystectomy. No biliary dilatation. Pancreas: Unremarkable. No pancreatic ductal dilatation or surrounding inflammatory changes. Spleen: Normal in size without focal abnormality. Adrenals/Urinary Tract: Adrenal glands appear normal. Horseshoe kidney is noted. Vascular calcifications are present and make it difficult to exclude small nonobstructing stones. No hydronephrosis. Urinary bladder is unremarkable. Stomach/Bowel: The sigmoid colon is markedly distended with an air-fluid level present but no volvulus is identified. There is no evidence of small bowel obstruction. The appendix appears normal. The stomach is unremarkable. Vascular/Lymphatic: Extensive aortoiliac atherosclerosis without aneurysm. No lymphadenopathy. Reproductive: Status post hysterectomy. No adnexal masses. Other: No fluid collection. Musculoskeletal: No acute bony abnormality. Left hip replacement is in place. IMPRESSION: Negative for small bowel obstruction. Marked gaseous distention of a redundant sigmoid colon is unchanged since the most recent examination and  likely related to functional abnormality. No volvulus is seen. Extensive atherosclerosis. Marked cardiomegaly. Horseshoe kidney. Extensive  vascular calcifications make it difficult to exclude small nonobstructing stones in the kidney but there is no hydronephrosis. Electronically Signed   By: Inge Rise M.D.   On: 06/28/2017 14:14   Dg Chest 2 View  Result Date: 06/28/2017 CLINICAL DATA:  84 year old female with chest pain and cough for 2 days. Abdominal distension. EXAM: CHEST  2 VIEW COMPARISON:  CT Abdomen and Pelvis 05/09/2017 and earlier. FINDINGS: Semi upright AP and lateral views chest. Stable cardiomegaly and mediastinal contours. Calcified aortic atherosclerosis. Stable lung volumes. No pneumothorax or pneumoperitoneum identified. No pulmonary edema, pleural effusion or confluent pulmonary opacity. Partially visible gas distended bowel loops in the abdomen. No acute osseous abnormality identified. Chronic deformity distal right clavicle is stable. IMPRESSION: 1. No acute cardiopulmonary abnormality. Stable cardiomegaly. Aortic Atherosclerosis (ICD10-I70.0). 2. Partially visible gas distended bowel loops in the abdomen. Electronically Signed   By: Genevie Ann M.D.   On: 06/28/2017 12:47   Dg Abdomen 1 View  Result Date: 06/28/2017 CLINICAL DATA:  Chest pain and cough over the last 2 days. EXAM: ABDOMEN - 1 VIEW COMPARISON:  04/19/2016, multiple prior studies as distant as 10/14/2013 FINDINGS: This patient has multiple previous exams showing a pseudo obstruction pattern with gaseous distention of the colon. True obstruction is not suspected based on multiple similar exams. No sign of any enteral tube. Clips in the right upper quadrant consistent with previous cholecystectomy. Curvature in degenerative change of the spine. Previous left hip replacement. Aortic atherosclerosis. IMPRESSION: Gaseous distention of the colon, seen on multiple previous exams, consistent with pseudo obstruction of the elderly. Electronically Signed   By: Nelson Chimes M.D.   On: 06/28/2017 13:07    My personal review of EKG: Rhythm-atrial fibrillation    Assessment & Plan:    Active Problems:   Volvulus of sigmoid colon (HCC)   Diarrhea   UTI (urinary tract infection)      1. UTI-patient has abnormal UA, she has history of ESBL E. coli. Started on Zosyn per pharmacy. Follow urine culture results. 2. Diarrhea-patient was recently treated with Keflex for UTI, concern for C. diff. Stool for C. diff obtained.CT of the abdomen and pelvis demonstrates no volvulus and no significant change from prior examination. 3. Atrial fibrillation-heart rate is controlled, patient takes aspirin for anticoagulation. Will hold aspirin this time due to recent history of blood in the stool. 4. Acute kidney injury on CKD stage III-patient's creatinine today 31 /2.30, her baseline creatinine was 1.5 on 05/22/17. Started on IV normal saline. 5. Chronic diastolic CHF- patient is currently euvolemic, will hold torsemide due to worsening renal function 6. History of recurrent sigmoid volvulus-CT abdomen shows no significant change from previous 7. Depression-continue Lexapro 8. Hypertension - continue Coreg, Norvasc   DVT Prophylaxis-   SCDs   AM Labs Ordered, also please review Full Orders  Family Communication: Admission, patients condition and plan of care including tests being ordered have been discussed with the patient  who indicate understanding and agree with the plan and Code Status.  Code Status: DNR  Admission status: inpatient  Time spent in minutes : 60 min   Oswald Hillock M.D on 06/28/2017 at 5:20 PM  Between 7am to 7pm - Pager - [phone]. After 7pm go to [email or web address] - password [facility]  [facility] - Office  [phone]

## 2017-06-28 NOTE — ED Notes (Signed)
Bed: LK44 Expected date:  Expected time:  Means of arrival:  Comments: EMS-SBO

## 2017-06-28 NOTE — ED Notes (Signed)
ED Provider at bedside. 

## 2017-06-28 NOTE — ED Notes (Signed)
Attempted to call report x1. Will call back once floor is done with report process.

## 2017-06-28 NOTE — ED Triage Notes (Signed)
Pt arrived via [facility] from [facility] with abdominal distention. Abdominal x-ray this morning showed small bowel obstruction. Pt has hx of same. Abdominal distention and diarrhea x2 days. Denies nausea and vomiting. Pt has hx of dementia and is at baseline.

## 2017-06-28 NOTE — Progress Notes (Signed)
Pharmacy Antibiotic Note  Maria Christian is a 84 y.o. female with hx CKD3, cholecystectomy, presented to the ED from nursing facility on 06/28/2017 with c/o abd distention.  Abd xray on 1/24 showed findings consistent with pseudo obstruction and abd CTA was neg for SBO.  To start zosyn for suspected intra-abdominal infection.  - scr 2.30 (crcl~16) - UA: many bacteria, moderate leukocytes   Plan: - zosyn 3.375 gm IV x1 over 30 min (given in ED), then 2.25 gm IV q8h - monitor renal function closely and adjust dose if/when appropriate ______________________________    Temp (24hrs), Avg:98.3 F (36.8 C), Min:98.3 F (36.8 C), Max:98.3 F (36.8 C)  Recent Labs  Lab 06/28/17 1213 06/28/17 1335  WBC 4.1  --   CREATININE 2.21* 2.30*  LATICACIDVEN  --  0.77    Estimated Creatinine Clearance: 15.8 mL/min (A) (by C-G formula based on SCr of 2.3 mg/dL (H)).    Allergies  Allergen Reactions  . Aspirin Other (See Comments)    G.I. Upset only    Thank you for allowing pharmacy to be a part of this patient's care.  Lynelle Doctor 06/28/2017 5:25 PM

## 2017-06-28 NOTE — Progress Notes (Signed)
A consult was received from an ED physician for zosyn per pharmacy dosing.  The patient's profile has been reviewed for ht/wt/allergies/indication/available labs.    A one time order has been placed for zosyn 3.375 gm IV x1 over 30 min.  Further antibiotics/pharmacy consults should be ordered by admitting physician if indicated.                       Thank you, Lynelle Doctor 06/28/2017  3:03 PM

## 2017-06-28 NOTE — ED Provider Notes (Signed)
[facility] DEPT Provider Note   CSN: [id number] Arrival date & time: 06/28/17  1106     History   Chief Complaint Chief Complaint  Patient presents with  . small bowel obstruction    HPI Maria Christian is a 84 y.o. female.  HPI  Patient is a 19-year-old female with a history of atrial fibrillation, CKD stage III, diabetes mellitus, hypertension, hyperlipidemia, and surgical history significant for chronic volvulus requiring decompression as well as a cholecystectomy presenting for concerns on x-ray at her nursing care facility regarding bowel obstruction.  Patient is accompanied by her daughter, who assists in history taking.  Patient reports she is not having significant abdominal pain at this time.  Patient reports that 3 days ago she began having abdominal discomfort and watery diarrhea.  She also noticed that her stools were dark in color with some intermittent bright red blood.  Patient has had poor oral intake over the last 24 hours due to the discomfort in her abdomen and distention.  Patient also notes she has had some pain with urination.  Patient denies chest pain, shortness of breath, fevers, chills.  Patient has been on oral Keflex for urinary tract infection.  Level 5 caveat dementia.  Past Medical History:  Diagnosis Date  . Altered mental status   . Anemia   . Atrial fibrillation ([place])   . CKD (chronic kidney disease) stage 3, GFR 30-59 ml/min (HCC) 02/27/2015  . Constipation 02/27/2015  . Coronary artery disease   . Dementia   . Depression   . Diabetes mellitus   . Edema of lower extremity 07/13/11   right leg more swollen than left leg  . Encephalopathy   . Enlarged heart   . Esophageal dysmotility 07/02/12  . GERD (gastroesophageal reflux disease)   . History of adenomatous polyp of colon 06/24/99  . Horseshoe kidney   . Hyperlipemia   . Hypertension   . Pancreatic lesion 05/22/11   no further workup  per PCP/family due to age    . Parkinson disease ([place])   . Peripheral neuropathy   . Sigmoid volvulus ([place]) 05/24/2013  . Thrombophlebitis   . TIA (transient ischemic attack) 12/19/2012    Patient Active Problem List   Diagnosis Date Noted  . Acute bronchitis 04/16/2017  . Abnormal weight loss 03/02/2017  . TMJ (temporomandibular joint syndrome) 03/02/2017  . Shingles 12/11/2016  . Dilatation of colon   . GERD (gastroesophageal reflux disease) 01/04/2016  . DM (diabetes mellitus), type 2 ([place]) 01/04/2016  . Hypertensive heart disease with congestive heart failure ([place]) 12/02/2015  . Vitamin B12 deficiency 09/27/2015  . Chronic respiratory failure ([place]) 03/16/2015  . Allergic rhinitis 03/16/2015  . Chronic pain 03/16/2015  . Osteoarthritis of multiple joints 03/16/2015  . Duodenal ulcer 03/12/2015  . Headache 03/09/2015  . Cephalgia 03/01/2015  . Constipation 02/27/2015  . Anemia of chronic disease 02/27/2015  . Chronic atrial fibrillation ([place]) 02/27/2015  . Hypokalemia 02/27/2015  . CKD (chronic kidney disease) stage 3, GFR 30-59 ml/min (HCC) 02/27/2015  . Ileus ([place]) 02/26/2015  . Malnutrition of moderate degree ([place]) 10/10/2013  . FTT (failure to thrive) in adult 07/29/2013  . Volvulus of sigmoid colon ([place]) 05/26/2013  . Dementia without behavioral disturbance 12/19/2012  . Depression 12/19/2012  . Coronary artery disease   . Diastolic CHF, chronic ([place]) 05/24/2011    Past Surgical History:  Procedure Laterality Date  . ABDOMINAL HYSTERECTOMY    . BOWEL DECOMPRESSION N/A 04/18/2016  Procedure: BOWEL DECOMPRESSION;  Surgeon: [name], MD;  Location: [facility];  Service: Endoscopy;  Laterality: N/A;  . BREAST SURGERY     2 benign tumors removed left breast  . CHOLECYSTECTOMY    . ESOPHAGEAL DILATION     several times by Dr. [name]  . ESOPHAGOGASTRODUODENOSCOPY (EGD) WITH ESOPHAGEAL DILATION N/A 08/01/2012   Procedure: ESOPHAGOGASTRODUODENOSCOPY (EGD) WITH ESOPHAGEAL DILATION;   Surgeon: [name], MD;  Location: [facility];  Service: Endoscopy;  Laterality: N/A;  with c-arm savory dilators  . ESOPHAGOGASTRODUODENOSCOPY (EGD) WITH PROPOFOL N/A 03/12/2015   Procedure: ESOPHAGOGASTRODUODENOSCOPY (EGD) WITH PROPOFOL;  Surgeon: [name], MD;  Location: [facility];  Service: Endoscopy;  Laterality: N/A;  . FLEXIBLE SIGMOIDOSCOPY N/A 05/24/2013   Procedure: FLEXIBLE SIGMOIDOSCOPY;  Surgeon: [name], MD;  Location: [facility];  Service: Endoscopy;  Laterality: N/A;  . FLEXIBLE SIGMOIDOSCOPY N/A 05/26/2013   Procedure:  flex with decompression of sigmoid volvulus;  Surgeon: [name], MD;  Location: [facility];  Service: Endoscopy;  Laterality: N/A;  . FLEXIBLE SIGMOIDOSCOPY N/A 12/20/2015   Procedure: FLEXIBLE SIGMOIDOSCOPY;  Surgeon: Milus Banister, MD;  Location: [facility];  Service: Endoscopy;  Laterality: N/A;  . FLEXIBLE SIGMOIDOSCOPY N/A 04/17/2016   Procedure: FLEXIBLE SIGMOIDOSCOPY;  Surgeon: Ladene Artist, MD;  Location: [facility];  Service: Endoscopy;  Laterality: N/A;  . FLEXIBLE SIGMOIDOSCOPY N/A 04/18/2016   Procedure: FLEXIBLE SIGMOIDOSCOPY;  Surgeon: [name], MD;  Location: [facility];  Service: Endoscopy;  Laterality: N/A;  . FOOT SURGERY     benign tumors from foot  . NOSE SURGERY    . SHOULDER SURGERY  2001   left clavicle excision and acromioplasty  . TOTAL HIP ARTHROPLASTY      OB History    No data available       Home Medications    Prior to Admission medications   Medication Sig Start Date End Date Taking? Authorizing Provider  amLODipine (NORVASC) 2.5 MG tablet Take 2.5 mg by mouth every morning. Hold for BP < 100/60 or HR < 60    [provider]  aspirin EC 81 MG tablet Take 81 mg by mouth daily.    [provider]  baclofen (LIORESAL) 10 MG tablet Take 5 mg by mouth at bedtime.    [provider]  benzocaine-menthol (CHLORASEPTIC) 6-10 MG lozenge Take 1 lozenge by  mouth as needed for sore throat.    [provider]  calcium carbonate (TUMS - DOSED IN MG ELEMENTAL CALCIUM) 500 MG chewable tablet Chew 2 tablets by mouth 3 (three) times daily.    [provider]  carvedilol (COREG) 3.125 MG tablet Take 3.125 mg by mouth daily.    [provider]  Cranberry 475 MG CAPS Take 1 capsule by mouth 2 (two) times daily.     [provider]  diclofenac sodium (VOLTAREN) 1 % GEL Apply 2 grams to neck topically three times a day for pain 05/15/17   [provider]  docusate sodium (COLACE) 100 MG capsule Take 200 mg by mouth at bedtime.     [provider]  dorzolamide (TRUSOPT) 2 % ophthalmic solution Place 1 drop into both eyes 3 (three) times daily.    [provider]  escitalopram (LEXAPRO) 10 MG tablet Take 10 mg by mouth daily.    [provider]  ferrous sulfate 220 (44 Fe) MG/5ML solution Give 5 ml by mouth in the evening for supplement  [provider]  fluticasone (FLONASE) 50 MCG/ACT nasal spray Place 2 sprays into both nostrils at bedtime.    [provider]  gabapentin (NEURONTIN) 100 MG capsule Take 100 mg by mouth at bedtime.    [provider]  Linaclotide (LINZESS) 145 MCG CAPS capsule Take 145 mcg by mouth daily before breakfast.     [provider]  loratadine (CLARITIN) 10 MG tablet Take 10 mg by mouth daily before breakfast.     [provider]  Multiple Vitamins-Minerals (CERTAGEN PO) Take 1 tablet by mouth every evening.     [provider]  Multiple Vitamins-Minerals (HAIR SKIN AND NAILS FORMULA) TABS Take by mouth. Give 2 tablets by mouth daily    [provider]  nystatin cream (MYCOSTATIN) Apply to buttocks topically two times a day for redness    [provider]  omeprazole (PRILOSEC) 20 MG capsule Take 20 mg by mouth daily.    [provider]  ondansetron (ZOFRAN) 8 MG tablet Take 8 mg by  mouth every 8 (eight) hours as needed for nausea or vomiting.    [provider]  Phenazopyridine HCl (AZO TABS PO) Take 1 tablet by mouth at bedtime.    [provider]  polyethylene glycol (MIRALAX / GLYCOLAX) packet Take 17 g by mouth daily.     [provider]  potassium chloride SA (K-DUR,KLOR-CON) 20 MEQ tablet Take 20-40 mEq by mouth 2 (two) times daily. Give 40 meq (2 tablets) in the morning and Give 20 meq (1 tablet) by mouth in the afternoon. 05/06/17   [provider]  senna (SENOKOT) 8.6 MG TABS tablet Take 1 tablet (8.6 mg total) by mouth 2 (two) times daily. 01/05/14   Barton Dubois, MD  sodium chloride (OCEAN) 0.65 % SOLN nasal spray Place 1 spray into both nostrils as directed. Four times daily And every 24 hours as needed to moisturize nasal passages.    [provider]  torsemide (DEMADEX) 20 MG tablet Take 20 mg by mouth daily.    [provider]  Travoprost, BAK Free, (TRAVATAN) 0.004 % SOLN ophthalmic solution Place 1 drop into both eyes daily at 8 pm.     [provider]  vitamin B-12 (CYANOCOBALAMIN) 500 MCG tablet Take 500 mcg by mouth every evening.     [provider]    Family History Family History  Problem Relation Age of Onset  . Cancer Unknown   . Heart disease Unknown     Social History Social History   Tobacco Use  . Smoking status: Never Smoker  . Smokeless tobacco: Never Used  Substance Use Topics  . Alcohol use: No  . Drug use: No     Allergies   Aspirin   Review of Systems Review of Systems  Constitutional: Positive for appetite change. Negative for chills and fever.  HENT: Positive for rhinorrhea.   Respiratory: Negative for cough and shortness of breath.   Cardiovascular: Negative for chest pain.  Gastrointestinal: Positive for abdominal distention, abdominal pain, blood in stool, diarrhea and nausea. Negative for vomiting.  Genitourinary: Positive for dysuria.  Skin:  Positive for color change.   Level 5 caveat dementia.  Physical Exam Updated Vital Signs BP (!) 114/54   Pulse 87   Temp 98.3 F (36.8 C) (Oral)   Resp 17   SpO2 96%   Physical Exam  Constitutional: She appears well-developed and well-nourished. No distress.  Lying comfortably supine in bed in no acute distress.  HENT:  Head: Normocephalic and atraumatic.  Mouth/Throat: Oropharynx is clear and moist.  Eyes: Conjunctivae and EOM are normal. Pupils are equal, round, and reactive to light.  Neck: Normal range of motion. Neck supple.  Cardiovascular: Normal rate, regular rhythm, S1 normal and S2 normal.  No murmur heard. Pulmonary/Chest: Effort normal and breath sounds normal. She has no wheezes. She has no rales.  Abdominal: Soft. She exhibits distension. There is no tenderness. There is no guarding.  Patient has a distended abdomen to the central to right abdomen.  There appears to be no abdominal wall defect.  No pulsatile mass of the abdomen.  Abdomen is tympanitic and nontender to percussion.  Genitourinary:  Genitourinary Comments: Rectal exam performed with nurse chaperone present.  No prolapsed internal hemorrhoids or thrombosed external hemorrhoids.  Patient has normal rectal tone.  There is loose brown stool present in rectal vault.  No masses in rectal vault or hemorrhoids palpated.  Musculoskeletal: Normal range of motion. She exhibits no edema or deformity.  Lymphadenopathy:    She has no cervical adenopathy.  Neurological: She is alert.  Cranial nerves grossly intact. Patient moves extremities symmetrically and with good coordination.  Skin: Skin is warm and dry. There is erythema.  Patient is noted to have diffuse erythema of the buttocks extending down to bilateral posterior thighs.  No ecchymosis flank, buttocks or posterior thigh.  No skin breakdown noted.  Exam performed with nurse chaperone present.  Psychiatric: She has a normal mood and affect. Her behavior is  normal. Judgment and thought content normal.  Nursing note and vitals reviewed.    ED Treatments / Results  Labs (all labs ordered are listed, but only abnormal results are displayed) Labs Reviewed  URINALYSIS, ROUTINE W REFLEX MICROSCOPIC - Abnormal; Notable for the following components:      Result Value   APPearance TURBID (*)    Hgb urine dipstick MODERATE (*)    Protein, ur 100 (*)    Leukocytes, UA MODERATE (*)    Bacteria, UA MANY (*)    Squamous Epithelial / LPF 6-30 (*)    All other components within normal limits  I-STAT CHEM 8, ED - Abnormal; Notable for the following components:   BUN 31 (*)    Creatinine, Ser 2.30 (*)    Glucose, Bld 141 (*)    TCO2 20 (*)    Hemoglobin 10.5 (*)    HCT 31.0 (*)    All other components within normal limits  POC OCCULT BLOOD, ED - Abnormal; Notable for the following components:   Fecal Occult Bld POSITIVE (*)    All other components within normal limits  URINE CULTURE  COMPREHENSIVE METABOLIC PANEL  CBC WITH DIFFERENTIAL/PLATELET  I-STAT CG4 LACTIC ACID, ED  I-STAT TROPONIN, ED  I-STAT CG4 LACTIC ACID, ED    EKG  EKG Interpretation  Date/Time:  Thursday June 28 2017 12:07:07 EST Ventricular Rate:  52 PR Interval:    QRS Duration: 88 QT Interval:  478 QTC Calculation: 445 R Axis:   127 Text Interpretation:  Atrial fibrillation Right axis deviation Low voltage, extremity leads Probable anterolateral infarct, old Baseline wander in lead(s) II III aVF No significant change since last tracing Confirmed by Gareth Morgan [phone]) on 06/28/2017 12:09:46 PM       Radiology Dg Chest 2 View  Result Date: 06/28/2017 CLINICAL DATA:  84 year old female with chest pain and cough for 2 days. Abdominal distension. EXAM: CHEST  2 VIEW COMPARISON:  CT Abdomen and Pelvis  05/09/2017 and earlier. FINDINGS: Semi upright AP and lateral views chest. Stable cardiomegaly and mediastinal contours. Calcified aortic atherosclerosis. Stable  lung volumes. No pneumothorax or pneumoperitoneum identified. No pulmonary edema, pleural effusion or confluent pulmonary opacity. Partially visible gas distended bowel loops in the abdomen. No acute osseous abnormality identified. Chronic deformity distal right clavicle is stable. IMPRESSION: 1. No acute cardiopulmonary abnormality. Stable cardiomegaly. Aortic Atherosclerosis (ICD10-I70.0). 2. Partially visible gas distended bowel loops in the abdomen. Electronically Signed   By: Genevie Ann M.D.   On: 06/28/2017 12:47   Dg Abdomen 1 View  Result Date: 06/28/2017 CLINICAL DATA:  Chest pain and cough over the last 2 days. EXAM: ABDOMEN - 1 VIEW COMPARISON:  04/19/2016, multiple prior studies as distant as 10/14/2013 FINDINGS: This patient has multiple previous exams showing a pseudo obstruction pattern with gaseous distention of the colon. True obstruction is not suspected based on multiple similar exams. No sign of any enteral tube. Clips in the right upper quadrant consistent with previous cholecystectomy. Curvature in degenerative change of the spine. Previous left hip replacement. Aortic atherosclerosis. IMPRESSION: Gaseous distention of the colon, seen on multiple previous exams, consistent with pseudo obstruction of the elderly. Electronically Signed   By: Nelson Chimes M.D.   On: 06/28/2017 13:07    Procedures Procedures (including critical care time)  Medications Ordered in ED Medications  sodium chloride 0.9 % bolus 500 mL (not administered)  iopamidol (ISOVUE-300) 61 % injection (not administered)     Initial Impression / Assessment and Plan / ED Course  I have reviewed the triage vital signs and the nursing notes.  Pertinent labs & imaging results that were available during my care of the patient were reviewed by me and considered in my medical decision making (see chart for details).      Final Clinical Impressions(s) / ED Diagnoses   Final diagnoses:  Acute cystitis with hematuria   Generalized weakness  Diarrhea, unspecified type   Patient is nontoxic-appearing, afebrile, and in no acute distress.  Patient does have bradycardic episodes down into the 50s.  Patient is in atrial fibrillation at this time.  Patient's abdomen is benign at this time.  Patient does exhibit a distention of the central to right abdomen that is nonpulsatile.  There appears to be no abdominal wall defect.  Unclear whether this is new or part of patient's chronic volvulus.  Will order CT abdomen pelvis, CBC, CMP, urinalysis, occult blood, stool cultures, and lactic acid. Patient case discussed with Dr. Gareth Morgan, who independently evaluated the patient.  1:46 PM patient reevaluated.  Patient is resting comfortably.  I spoke with CT, who will change the order to CT abdomen and pelvis without contrast due to elevated creatinine of 2.30.  Patient CKD stage III, and this elevation is chronic for patient.  Additionally, patient exhibits urinary tract infection.  Will initiate IV antibiotics.  CT of the abdomen and pelvis demonstrates no volvulus and no significant change from prior examination.  Will consult hospital medicine regarding admission for generalized weakness, failure of outpatient therapy for urinary tract infection, and volume depletion from diarrhea, as well as occult positive stool.  Spoke with Dr. Rockne Menghini hospital medicine, who will admit the patient.  Spoke with Clemens Catholic, nurse practitioner at [facility], where patient resides.  Per this next practitioner [phone]), this facility is equipped to do IV antibiotics as well as IV fluids.  She stated that they are able to perform these services should patient only require observation.  ED Discharge Orders    None       Tamala Julian 06/28/17 1634    Albesa Seen, PA-C 06/28/17 1656    Gareth Morgan, MD 06/28/17 1944

## 2017-06-28 NOTE — ED Notes (Signed)
Patient transported to CT 

## 2017-06-28 NOTE — ED Notes (Signed)
Patient placed on purwick

## 2017-06-29 DIAGNOSIS — N3000 Acute cystitis without hematuria: Secondary | ICD-10-CM

## 2017-06-29 DIAGNOSIS — R001 Bradycardia, unspecified: Secondary | ICD-10-CM

## 2017-06-29 LAB — GASTROINTESTINAL PANEL BY PCR, STOOL (REPLACES STOOL CULTURE)
ASTROVIRUS: NOT DETECTED
Adenovirus F40/41: NOT DETECTED
CAMPYLOBACTER SPECIES: NOT DETECTED
Cryptosporidium: NOT DETECTED
Cyclospora cayetanensis: NOT DETECTED
ENTEROTOXIGENIC E COLI (ETEC): NOT DETECTED
Entamoeba histolytica: NOT DETECTED
Enteroaggregative E coli (EAEC): NOT DETECTED
Enteropathogenic E coli (EPEC): NOT DETECTED
Giardia lamblia: NOT DETECTED
NOROVIRUS GI/GII: NOT DETECTED
PLESIMONAS SHIGELLOIDES: NOT DETECTED
ROTAVIRUS A: NOT DETECTED
SALMONELLA SPECIES: NOT DETECTED
SAPOVIRUS (I, II, IV, AND V): NOT DETECTED
SHIGA LIKE TOXIN PRODUCING E COLI (STEC): NOT DETECTED
SHIGELLA/ENTEROINVASIVE E COLI (EIEC): NOT DETECTED
Vibrio cholerae: NOT DETECTED
Vibrio species: NOT DETECTED
Yersinia enterocolitica: NOT DETECTED

## 2017-06-29 LAB — COMPREHENSIVE METABOLIC PANEL
ALT: 13 U/L — AB (ref 14–54)
AST: 22 U/L (ref 15–41)
Albumin: 3.2 g/dL — ABNORMAL LOW (ref 3.5–5.0)
Alkaline Phosphatase: 79 U/L (ref 38–126)
Anion gap: 8 (ref 5–15)
BUN: 31 mg/dL — ABNORMAL HIGH (ref 6–20)
CHLORIDE: 115 mmol/L — AB (ref 101–111)
CO2: 19 mmol/L — AB (ref 22–32)
CREATININE: 1.84 mg/dL — AB (ref 0.44–1.00)
Calcium: 8.4 mg/dL — ABNORMAL LOW (ref 8.9–10.3)
GFR calc non Af Amer: 23 mL/min — ABNORMAL LOW (ref >60)
GFR, EST AFRICAN AMERICAN: 27 mL/min — AB (ref >60)
Glucose, Bld: 114 mg/dL — ABNORMAL HIGH (ref 65–99)
POTASSIUM: 3.5 mmol/L (ref 3.5–5.1)
SODIUM: 142 mmol/L (ref 135–145)
Total Bilirubin: 0.4 mg/dL (ref 0.3–1.2)
Total Protein: 6.6 g/dL (ref 6.5–8.1)

## 2017-06-29 LAB — CBC
HCT: 29.9 % — ABNORMAL LOW (ref 36.0–46.0)
Hemoglobin: 9.7 g/dL — ABNORMAL LOW (ref 12.0–15.0)
MCH: 34.3 pg — AB (ref 26.0–34.0)
MCHC: 32.4 g/dL (ref 30.0–36.0)
MCV: 105.7 fL — ABNORMAL HIGH (ref 78.0–100.0)
PLATELETS: 85 10*3/uL — AB (ref 150–400)
RBC: 2.83 MIL/uL — AB (ref 3.87–5.11)
RDW: 14.4 % (ref 11.5–15.5)
WBC: 3.4 10*3/uL — AB (ref 4.0–10.5)

## 2017-06-29 LAB — URINE CULTURE

## 2017-06-29 MED ORDER — LOPERAMIDE HCL 2 MG PO CAPS
2.0000 mg | ORAL_CAPSULE | Freq: Three times a day (TID) | ORAL | Status: DC | PRN
  Administered 2017-06-29 (×2): 2 mg via ORAL
  Filled 2017-06-29 (×2): qty 1

## 2017-06-29 MED ORDER — GUAIFENESIN-DM 100-10 MG/5ML PO SYRP
5.0000 mL | ORAL_SOLUTION | ORAL | Status: DC | PRN
  Filled 2017-06-29: qty 10

## 2017-06-29 MED ORDER — HYDROCODONE-ACETAMINOPHEN 5-325 MG PO TABS
1.0000 | ORAL_TABLET | Freq: Four times a day (QID) | ORAL | Status: DC | PRN
  Filled 2017-06-29 (×2): qty 1

## 2017-06-29 MED ORDER — ACETAMINOPHEN 325 MG PO TABS
650.0000 mg | ORAL_TABLET | Freq: Four times a day (QID) | ORAL | Status: DC | PRN
  Administered 2017-06-29 – 2017-07-04 (×2): 650 mg via ORAL
  Filled 2017-06-29: qty 2

## 2017-06-29 NOTE — Progress Notes (Signed)
[facility]  PROGRESS NOTE  Maria Christian ACZ:[id number] DOB: [birth year] DOA: 06/28/2017 PCP: [name], DO   Brief HPI:   female, with history of atrial fibrillation, not on chronic anticoagulation, take aspirin for anticoagulation, depression, chronic respiratory failure, chronic constipation with history of recurrent sigmoid volvulus, hypertension, CKD stage III, chronic diastolic heart failure who came to hospital with chief complaints of diarrhea. Initially patient was sent from the skilled facility for concern for SBO as x-ray of the abdomen showed findings consistent with small bowel obstruction. Patient complains of abdominal pain with watery diarrhea for past three days. Also noted intermittent bright red blood in the stool. She has had poor PO intake. Denies any vomiting. Complains of dysuria      Subjective   Patient seen and examined, still has loose BM's but C diff PCR is negative.   Assessment/Plan:      1. UTI-patient has abnormal UA, she has history of ESBL E. coli. Started on Zosyn per pharmacy. Urine culture obtained, results are pending. 2. Diarrhea-patient was recently treated with Keflex for UTI, concern for C. diff. Stool for C. diff obtained, which is negative.CT of the abdomen and pelvis demonstrates no volvulus and no significant change from prior exam. 3. Atrial fibrillation-heart rate is controlled, patient takes aspirin for anticoagulation. Will hold aspirin this time due to recent history of blood in the stool. 4. Bradycardia-patient heart rate dropped to 30s while sleeping, she has been symptomatic. Heart rate improved to 50s when she is awake. She has been taking Coreg 3.125 mg PO daily. Will discontinue Coreg. If heart rate does not improve after stopping beta blocker will consult cardiology for further recommendations. 5. Acute kidney injury on CKD stage III-patient's creatinine was  31 /2.30 on admission, her baseline creatinine was 1.5 on  05/22/17. Started on IV normal saline. Today her creatinine is 1.84. Continue IV fluids. Follow BMP in am. 6. Chronic diastolic CHF- patient is currently euvolemic, torsemide is on hold due to worsening renal function 7. History of recurrent sigmoid volvulus-CT abdomen shows no significant change from previous 8. Depression-continue Lexapro 9. Hypertension - continue Coreg, Norvasc      DVT prophylaxis: SCD's  Code Status: DNR  Family Communication: Discussed with daughter at bedside, son on phone  Disposition Plan: SNF in 2-3 days   Consultants:  None   Procedures:  None   Continuous infusions . sodium chloride 75 mL/hr at 06/28/17 2118  . piperacillin-tazobactam (ZOSYN)  IV Stopped (06/29/17 1093)      Antibiotics:   Anti-infectives (From admission, onward)   Start     Dose/Rate Route Frequency Ordered Stop   06/28/17 2330  piperacillin-tazobactam (ZOSYN) IVPB 2.25 g     2.25 g 100 mL/hr over 30 Minutes Intravenous Every 8 hours 06/28/17 1736     06/28/17 1515  piperacillin-tazobactam (ZOSYN) IVPB 3.375 g     3.375 g 100 mL/hr over 30 Minutes Intravenous  Once 06/28/17 1504 06/28/17 1718       Objective   Vitals:   06/28/17 1915 06/28/17 1957 06/29/17 0405 06/29/17 0808  BP: 132/74 (!) 125/58 (!) 119/58   Pulse: (!) 55 95 60 70  Resp: 13 16 18    Temp:  98.9 F (37.2 C) 98 F (36.7 C)   TempSrc:  Oral Oral   SpO2: 96% 100% 100%   Weight:  69 kg (152 lb 3.2 oz)    Height:  5\' 7"  (1.702 m)      Intake/Output Summary (Last  24 hours) at 06/29/2017 1132 Last data filed at 06/29/2017 0600 Gross per 24 hour  Intake 942.5 ml  Output 2 ml  Net 940.5 ml   Filed Weights   06/28/17 1957  Weight: 69 kg (152 lb 3.2 oz)     Physical Examination:  Physical Exam: Eyes: No icterus, extraocular muscles intact  Mouth: Oral mucosa is moist, no lesions on palate,  Neck: Supple, no deformities, masses, or tenderness Lungs: Normal respiratory effort,  bilateral clear to auscultation, no crackles or wheezes.  Heart: Regular rate and rhythm, S1 and S2 normal, no murmurs, rubs auscultated Abdomen: BS normoactive,soft,nondistended,non-tender to palpation,no organomegaly Extremities: No pretibial edema, no erythema, no cyanosis, no clubbing Neuro : Alert and oriented to time, place and person, No focal deficits      Data Reviewed: I have personally reviewed following labs and imaging studies  CBG: No results for input(s): GLUCAP in the last 168 hours.  CBC: Recent Labs  Lab 06/28/17 1213 06/28/17 1335 06/29/17 0425  WBC 4.1  --  3.4*  NEUTROABS 1.8  --   --   HGB 10.4* 10.5* 9.7*  HCT 31.7* 31.0* 29.9*  MCV 106.0*  --  105.7*  PLT 88*  --  85*    Basic Metabolic Panel: Recent Labs  Lab 06/28/17 1213 06/28/17 1335 06/29/17 0425  NA 138 143 142  K 4.2 4.2 3.5  CL 111 110 115*  CO2 20*  --  19*  GLUCOSE 146* 141* 114*  BUN 32* 31* 31*  CREATININE 2.21* 2.30* 1.84*  CALCIUM 8.6*  --  8.4*    Recent Results (from the past 240 hour(s))  C difficile quick scan w PCR reflex     Status: None   Collection Time: 06/28/17  6:59 PM  Result Value Ref Range Status   C Diff antigen NEGATIVE NEGATIVE Final   C Diff toxin NEGATIVE NEGATIVE Final   C Diff interpretation No C. difficile detected.  Final  MRSA PCR Screening     Status: None   Collection Time: 06/28/17  8:00 PM  Result Value Ref Range Status   MRSA by PCR NEGATIVE NEGATIVE Final    Comment:        The GeneXpert MRSA Assay (FDA approved for NASAL specimens only), is one component of a comprehensive MRSA colonization surveillance program. It is not intended to diagnose MRSA infection nor to guide or monitor treatment for MRSA infections.      Liver Function Tests: Recent Labs  Lab 06/28/17 1213 06/29/17 0425  AST 28 22  ALT 14 13*  ALKPHOS 90 79  BILITOT 0.5 0.4  PROT 7.3 6.6  ALBUMIN 3.6 3.2*   No results for input(s): LIPASE, AMYLASE in the  last 168 hours. No results for input(s): AMMONIA in the last 168 hours.  Cardiac Enzymes: No results for input(s): CKTOTAL, CKMB, CKMBINDEX, TROPONINI in the last 168 hours. BNP (last 3 results) Recent Labs    05/09/17 1706  BNP 429.7*    ProBNP (last 3 results) No results for input(s): PROBNP in the last 8760 hours.    Studies: Ct Abdomen Pelvis Wo Contrast  Result Date: 06/28/2017 CLINICAL DATA:  Abdominal pain. Plain film of the abdomen worrisome for small bowel obstruction. EXAM: CT ABDOMEN AND PELVIS WITHOUT CONTRAST TECHNIQUE: Multidetector CT imaging of the abdomen and pelvis was performed following the standard protocol without IV contrast. COMPARISON:  CT abdomen and pelvis 05/09/2017 and 04/17/2016. FINDINGS: Lower chest: There is marked cardiomegaly. No pericardial or  pleural effusion. Mild dependent atelectasis in the lung bases. Hepatobiliary: No focal liver abnormality is seen. Status post cholecystectomy. No biliary dilatation. Pancreas: Unremarkable. No pancreatic ductal dilatation or surrounding inflammatory changes. Spleen: Normal in size without focal abnormality. Adrenals/Urinary Tract: Adrenal glands appear normal. Horseshoe kidney is noted. Vascular calcifications are present and make it difficult to exclude small nonobstructing stones. No hydronephrosis. Urinary bladder is unremarkable. Stomach/Bowel: The sigmoid colon is markedly distended with an air-fluid level present but no volvulus is identified. There is no evidence of small bowel obstruction. The appendix appears normal. The stomach is unremarkable. Vascular/Lymphatic: Extensive aortoiliac atherosclerosis without aneurysm. No lymphadenopathy. Reproductive: Status post hysterectomy. No adnexal masses. Other: No fluid collection. Musculoskeletal: No acute bony abnormality. Left hip replacement is in place. IMPRESSION: Negative for small bowel obstruction. Marked gaseous distention of a redundant sigmoid colon is  unchanged since the most recent examination and likely related to functional abnormality. No volvulus is seen. Extensive atherosclerosis. Marked cardiomegaly. Horseshoe kidney. Extensive vascular calcifications make it difficult to exclude small nonobstructing stones in the kidney but there is no hydronephrosis. Electronically Signed   By: Inge Rise M.D.   On: 06/28/2017 14:14   Dg Chest 2 View  Result Date: 06/28/2017 CLINICAL DATA:  84 year old female with chest pain and cough for 2 days. Abdominal distension. EXAM: CHEST  2 VIEW COMPARISON:  CT Abdomen and Pelvis 05/09/2017 and earlier. FINDINGS: Semi upright AP and lateral views chest. Stable cardiomegaly and mediastinal contours. Calcified aortic atherosclerosis. Stable lung volumes. No pneumothorax or pneumoperitoneum identified. No pulmonary edema, pleural effusion or confluent pulmonary opacity. Partially visible gas distended bowel loops in the abdomen. No acute osseous abnormality identified. Chronic deformity distal right clavicle is stable. IMPRESSION: 1. No acute cardiopulmonary abnormality. Stable cardiomegaly. Aortic Atherosclerosis (ICD10-I70.0). 2. Partially visible gas distended bowel loops in the abdomen. Electronically Signed   By: Genevie Ann M.D.   On: 06/28/2017 12:47   Dg Abdomen 1 View  Result Date: 06/28/2017 CLINICAL DATA:  Chest pain and cough over the last 2 days. EXAM: ABDOMEN - 1 VIEW COMPARISON:  04/19/2016, multiple prior studies as distant as 10/14/2013 FINDINGS: This patient has multiple previous exams showing a pseudo obstruction pattern with gaseous distention of the colon. True obstruction is not suspected based on multiple similar exams. No sign of any enteral tube. Clips in the right upper quadrant consistent with previous cholecystectomy. Curvature in degenerative change of the spine. Previous left hip replacement. Aortic atherosclerosis. IMPRESSION: Gaseous distention of the colon, seen on multiple previous exams,  consistent with pseudo obstruction of the elderly. Electronically Signed   By: Nelson Chimes M.D.   On: 06/28/2017 13:07    Scheduled Meds: . amLODipine  2.5 mg Oral Daily  . baclofen  5 mg Oral QHS  . cyanocobalamin  500 mcg Oral QPM  . dorzolamide  1 drop Both Eyes TID  . escitalopram  10 mg Oral Daily  . famotidine  20 mg Oral QHS  . fluticasone  2 spray Each Nare QHS  . gabapentin  100 mg Oral QHS  . latanoprost  1 drop Both Eyes QHS  . loratadine  10 mg Oral QAC breakfast      Time spent: 25 min  [facility] Pager [phone]. If 7PM-7AM, please contact night-coverage at [email or web address], Office  [phone]  password [email or web address]  06/29/2017, 11:32 AM  LOS: 1 day

## 2017-06-29 NOTE — Progress Notes (Signed)
Per CCMD pt had a 2.7 second pause, HR dropped to 29.  Pt asleep and asymptomatic.  Dr. Darrick Meigs made aware.  Will continue to monitor closely.

## 2017-06-29 NOTE — Progress Notes (Signed)
Pt HR sustaining in the 30's while asleep.  Dr. Darrick Meigs made aware.  Pt asymptomatic.

## 2017-06-30 LAB — BASIC METABOLIC PANEL
Anion gap: 4 — ABNORMAL LOW (ref 5–15)
BUN: 23 mg/dL — ABNORMAL HIGH (ref 6–20)
CHLORIDE: 114 mmol/L — AB (ref 101–111)
CO2: 18 mmol/L — AB (ref 22–32)
Calcium: 8 mg/dL — ABNORMAL LOW (ref 8.9–10.3)
Creatinine, Ser: 1.69 mg/dL — ABNORMAL HIGH (ref 0.44–1.00)
GFR calc non Af Amer: 26 mL/min — ABNORMAL LOW (ref >60)
GFR, EST AFRICAN AMERICAN: 30 mL/min — AB (ref >60)
GLUCOSE: 102 mg/dL — AB (ref 65–99)
Potassium: 2.7 mmol/L — CL (ref 3.5–5.1)
Sodium: 136 mmol/L (ref 135–145)

## 2017-06-30 LAB — CBC
HEMATOCRIT: 27.5 % — AB (ref 36.0–46.0)
Hemoglobin: 9.2 g/dL — ABNORMAL LOW (ref 12.0–15.0)
MCH: 35 pg — ABNORMAL HIGH (ref 26.0–34.0)
MCHC: 33.5 g/dL (ref 30.0–36.0)
MCV: 104.6 fL — AB (ref 78.0–100.0)
Platelets: 74 10*3/uL — ABNORMAL LOW (ref 150–400)
RBC: 2.63 MIL/uL — ABNORMAL LOW (ref 3.87–5.11)
RDW: 14.3 % (ref 11.5–15.5)
WBC: 3.1 10*3/uL — AB (ref 4.0–10.5)

## 2017-06-30 MED ORDER — POTASSIUM CHLORIDE 10 MEQ/100ML IV SOLN
10.0000 meq | INTRAVENOUS | Status: AC
  Administered 2017-06-30 (×4): 10 meq via INTRAVENOUS
  Filled 2017-06-30 (×4): qty 100

## 2017-06-30 MED ORDER — FOSFOMYCIN TROMETHAMINE 3 G PO PACK
3.0000 g | PACK | ORAL | Status: AC
  Administered 2017-06-30 – 2017-07-06 (×3): 3 g via ORAL
  Filled 2017-06-30 (×3): qty 3

## 2017-06-30 NOTE — Progress Notes (Signed)
CRITICAL VALUE ALERT  Critical Value:  Potassium 2.7  Date & Time Notied:  06/30/17@0638   Provider Notified: yes  Orders Received/Actions taken: Yes

## 2017-06-30 NOTE — Progress Notes (Addendum)
[facility]  PROGRESS NOTE  Maria Christian LOV:[id number] DOB: [birth year] DOA: 06/28/2017 PCP: [name], DO   Brief HPI:   female, with history of atrial fibrillation, not on chronic anticoagulation, take aspirin for anticoagulation, depression, chronic respiratory failure, chronic constipation with history of recurrent sigmoid volvulus, hypertension, CKD stage III, chronic diastolic heart failure who came to hospital with chief complaints of diarrhea. Initially patient was sent from the skilled facility for concern for SBO as x-ray of the abdomen showed findings consistent with small bowel obstruction. Patient complains of abdominal pain with watery diarrhea for past three days. Also noted intermittent bright red blood in the stool. She has had poor PO intake. Denies any vomiting. Complains of dysuria      Subjective   Patient seen and examined, diarrhea slowly improving. Heart rate has improved after stopping Coreg.   Assessment/Plan:      1. UTI-patient has abnormal UA, she has history of ESBL E. coli. Started on Zosyn per pharmacy. Urine culture showed mixed forearm with concern for ESBL. Will discontinue Zosyn and start Fosfomycin. 2. Diarrhea-patient was recently treated with Keflex for UTI, concern for C. diff. Stool for C. diff obtained, which is negative.CT of the abdomen and pelvis demonstrates no volvulus and no significant change from prior exam. 3. Atrial fibrillation-heart rate is controlled, patient takes aspirin for anticoagulation. Will hold aspirin this time due to recent history of blood in the stool. 4. Bradycardia-improved after stopping Coreg, patient heart rate dropped to 30s while sleeping, she has been symptomatic. Heart rate improved to 50s when she is awake. She has been taking Coreg 3.125 mg PO daily. Coreg was discontinued.  5. Acute kidney injury on CKD stage III-patient's creatinine was  31 /2.30 on admission, her baseline creatinine was 1.5 on  05/22/17. Started on IV normal saline. Today her creatinine is 1.69. Continue IV fluids. Follow BMP in am. 6. Chronic diastolic CHF- patient is currently euvolemic, torsemide is on hold due to worsening renal function 7. Hypokalemia-potassium is 2.7, replace potassium and check BMP in am. 8. History of recurrent sigmoid volvulus-CT abdomen shows no significant change from previous 9. Depression-continue Lexapro 10. Hypertension - continue Coreg, Norvasc      DVT prophylaxis: SCD's  Code Status: DNR  Family Communication: Discussed with daughter at bedside, son on phone  Disposition Plan: SNF in 2-3 days   Consultants:  None   Procedures:  None   Continuous infusions . sodium chloride 75 mL/hr at 06/30/17 0318  . potassium chloride 10 mEq (06/30/17 1315)      Antibiotics:   Anti-infectives (From admission, onward)   Start     Dose/Rate Route Frequency Ordered Stop   06/28/17 2330  piperacillin-tazobactam (ZOSYN) IVPB 2.25 g  Status:  Discontinued     2.25 g 100 mL/hr over 30 Minutes Intravenous Every 8 hours 06/28/17 1736 06/30/17 0901   06/28/17 1515  piperacillin-tazobactam (ZOSYN) IVPB 3.375 g     3.375 g 100 mL/hr over 30 Minutes Intravenous  Once 06/28/17 1504 06/28/17 1718       Objective   Vitals:   06/29/17 0808 06/29/17 1442 06/29/17 2142 06/30/17 0708  BP:  (!) 127/58 124/61 (!) 130/59  Pulse: 70 (!) 55 61 (!) 109  Resp:  18 18 20   Temp:  98 F (36.7 C) 98.2 F (36.8 C) 97.8 F (36.6 C)  TempSrc:  Oral Oral Oral  SpO2:  100% 97% 98%  Weight:      Height:  Intake/Output Summary (Last 24 hours) at 06/30/2017 1328 Last data filed at 06/30/2017 0620 Gross per 24 hour  Intake 1020 ml  Output -  Net 1020 ml   Filed Weights   06/28/17 1957  Weight: 69 kg (152 lb 3.2 oz)     Physical Examination:  Physical Exam: Eyes: No icterus, extraocular muscles intact  Mouth: Oral mucosa is moist, no lesions on palate,  Neck: Supple, no  deformities, masses, or tenderness Lungs: Normal respiratory effort, bilateral clear to auscultation, no crackles or wheezes.  Heart: Regular rate and rhythm, S1 and S2 normal, no murmurs, rubs auscultated Abdomen: BS normoactive,soft,nondistended,non-tender to palpation,no organomegaly Extremities: No pretibial edema, no erythema, no cyanosis, no clubbing Neuro : Alert and oriented to time, place and person, No focal deficits       Data Reviewed: I have personally reviewed following labs and imaging studies  CBG: No results for input(s): GLUCAP in the last 168 hours.  CBC: Recent Labs  Lab 06/28/17 1213 06/28/17 1335 06/29/17 0425 06/30/17 0503  WBC 4.1  --  3.4* 3.1*  NEUTROABS 1.8  --   --   --   HGB 10.4* 10.5* 9.7* 9.2*  HCT 31.7* 31.0* 29.9* 27.5*  MCV 106.0*  --  105.7* 104.6*  PLT 88*  --  85* 74*    Basic Metabolic Panel: Recent Labs  Lab 06/28/17 1213 06/28/17 1335 06/29/17 0425 06/30/17 0503  NA 138 143 142 136  K 4.2 4.2 3.5 2.7*  CL 111 110 115* 114*  CO2 20*  --  19* 18*  GLUCOSE 146* 141* 114* 102*  BUN 32* 31* 31* 23*  CREATININE 2.21* 2.30* 1.84* 1.69*  CALCIUM 8.6*  --  8.4* 8.0*    Recent Results (from the past 240 hour(s))  Urine culture     Status: Abnormal   Collection Time: 06/28/17 12:13 PM  Result Value Ref Range Status   Specimen Description   Final    URINE, RANDOM Performed at [facility] [address], [place], [place] [address]    Special Requests NONE  Final   Culture MULTIPLE SPECIES PRESENT, SUGGEST RECOLLECTION (A)  Final   Report Status 06/29/2017 FINAL  Final  Gastrointestinal Panel by PCR , Stool     Status: None   Collection Time: 06/28/17  6:59 PM  Result Value Ref Range Status   Campylobacter species NOT DETECTED NOT DETECTED Final   Plesimonas shigelloides NOT DETECTED NOT DETECTED Final   Salmonella species NOT DETECTED NOT DETECTED Final   Yersinia enterocolitica NOT DETECTED NOT DETECTED Final    Vibrio species NOT DETECTED NOT DETECTED Final   Vibrio cholerae NOT DETECTED NOT DETECTED Final   Enteroaggregative E coli (EAEC) NOT DETECTED NOT DETECTED Final   Enteropathogenic E coli (EPEC) NOT DETECTED NOT DETECTED Final   Enterotoxigenic E coli (ETEC) NOT DETECTED NOT DETECTED Final   Shiga like toxin producing E coli (STEC) NOT DETECTED NOT DETECTED Final   Shigella/Enteroinvasive E coli (EIEC) NOT DETECTED NOT DETECTED Final   Cryptosporidium NOT DETECTED NOT DETECTED Final   Cyclospora cayetanensis NOT DETECTED NOT DETECTED Final   Entamoeba histolytica NOT DETECTED NOT DETECTED Final   Giardia lamblia NOT DETECTED NOT DETECTED Final   Adenovirus F40/41 NOT DETECTED NOT DETECTED Final   Astrovirus NOT DETECTED NOT DETECTED Final   Norovirus GI/GII NOT DETECTED NOT DETECTED Final   Rotavirus A NOT DETECTED NOT DETECTED Final   Sapovirus (I, II, IV, and V) NOT DETECTED NOT  DETECTED Final    Comment: Performed at [facility], [address] [place], [place] [address]  C difficile quick scan w PCR reflex     Status: None   Collection Time: 06/28/17  6:59 PM  Result Value Ref Range Status   C Diff antigen NEGATIVE NEGATIVE Final   C Diff toxin NEGATIVE NEGATIVE Final   C Diff interpretation No C. difficile detected.  Final  MRSA PCR Screening     Status: None   Collection Time: 06/28/17  8:00 PM  Result Value Ref Range Status   MRSA by PCR NEGATIVE NEGATIVE Final    Comment:        The GeneXpert MRSA Assay (FDA approved for NASAL specimens only), is one component of a comprehensive MRSA colonization surveillance program. It is not intended to diagnose MRSA infection nor to guide or monitor treatment for MRSA infections.      Liver Function Tests: Recent Labs  Lab 06/28/17 1213 06/29/17 0425  AST 28 22  ALT 14 13*  ALKPHOS 90 79  BILITOT 0.5 0.4  PROT 7.3 6.6  ALBUMIN 3.6 3.2*   No results for input(s): LIPASE, AMYLASE in the last 168 hours. No  results for input(s): AMMONIA in the last 168 hours.  Cardiac Enzymes: No results for input(s): CKTOTAL, CKMB, CKMBINDEX, TROPONINI in the last 168 hours. BNP (last 3 results) Recent Labs    05/09/17 1706  BNP 429.7*    ProBNP (last 3 results) No results for input(s): PROBNP in the last 8760 hours.    Studies: Ct Abdomen Pelvis Wo Contrast  Result Date: 06/28/2017 CLINICAL DATA:  Abdominal pain. Plain film of the abdomen worrisome for small bowel obstruction. EXAM: CT ABDOMEN AND PELVIS WITHOUT CONTRAST TECHNIQUE: Multidetector CT imaging of the abdomen and pelvis was performed following the standard protocol without IV contrast. COMPARISON:  CT abdomen and pelvis 05/09/2017 and 04/17/2016. FINDINGS: Lower chest: There is marked cardiomegaly. No pericardial or pleural effusion. Mild dependent atelectasis in the lung bases. Hepatobiliary: No focal liver abnormality is seen. Status post cholecystectomy. No biliary dilatation. Pancreas: Unremarkable. No pancreatic ductal dilatation or surrounding inflammatory changes. Spleen: Normal in size without focal abnormality. Adrenals/Urinary Tract: Adrenal glands appear normal. Horseshoe kidney is noted. Vascular calcifications are present and make it difficult to exclude small nonobstructing stones. No hydronephrosis. Urinary bladder is unremarkable. Stomach/Bowel: The sigmoid colon is markedly distended with an air-fluid level present but no volvulus is identified. There is no evidence of small bowel obstruction. The appendix appears normal. The stomach is unremarkable. Vascular/Lymphatic: Extensive aortoiliac atherosclerosis without aneurysm. No lymphadenopathy. Reproductive: Status post hysterectomy. No adnexal masses. Other: No fluid collection. Musculoskeletal: No acute bony abnormality. Left hip replacement is in place. IMPRESSION: Negative for small bowel obstruction. Marked gaseous distention of a redundant sigmoid colon is unchanged since the most  recent examination and likely related to functional abnormality. No volvulus is seen. Extensive atherosclerosis. Marked cardiomegaly. Horseshoe kidney. Extensive vascular calcifications make it difficult to exclude small nonobstructing stones in the kidney but there is no hydronephrosis. Electronically Signed   By: Inge Rise M.D.   On: 06/28/2017 14:14    Scheduled Meds: . amLODipine  2.5 mg Oral Daily  . baclofen  5 mg Oral QHS  . cyanocobalamin  500 mcg Oral QPM  . dorzolamide  1 drop Both Eyes TID  . escitalopram  10 mg Oral Daily  . famotidine  20 mg Oral QHS  . fluticasone  2 spray Each Nare QHS  .  fosfomycin  3 g Oral Q72H  . gabapentin  100 mg Oral QHS  . latanoprost  1 drop Both Eyes QHS  . loratadine  10 mg Oral QAC breakfast      Time spent: 25 min  [facility] Pager [phone]. If 7PM-7AM, please contact night-coverage at [email or web address], Office  [phone]  password [email or web address]  06/30/2017, 1:28 PM  LOS: 2 days

## 2017-07-01 DIAGNOSIS — E876 Hypokalemia: Secondary | ICD-10-CM

## 2017-07-01 LAB — BASIC METABOLIC PANEL
ANION GAP: 4 — AB (ref 5–15)
BUN: 14 mg/dL (ref 6–20)
CO2: 17 mmol/L — ABNORMAL LOW (ref 22–32)
Calcium: 8 mg/dL — ABNORMAL LOW (ref 8.9–10.3)
Chloride: 120 mmol/L — ABNORMAL HIGH (ref 101–111)
Creatinine, Ser: 1.28 mg/dL — ABNORMAL HIGH (ref 0.44–1.00)
GFR calc Af Amer: 41 mL/min — ABNORMAL LOW (ref >60)
GFR, EST NON AFRICAN AMERICAN: 36 mL/min — AB (ref >60)
GLUCOSE: 98 mg/dL (ref 65–99)
POTASSIUM: 3 mmol/L — AB (ref 3.5–5.1)
Sodium: 141 mmol/L (ref 135–145)

## 2017-07-01 LAB — MAGNESIUM: MAGNESIUM: 1.8 mg/dL (ref 1.7–2.4)

## 2017-07-01 MED ORDER — POTASSIUM CHLORIDE 10 MEQ/100ML IV SOLN
10.0000 meq | INTRAVENOUS | Status: DC
  Filled 2017-07-01 (×4): qty 100

## 2017-07-01 MED ORDER — POTASSIUM CHLORIDE 10 MEQ/100ML IV SOLN
10.0000 meq | INTRAVENOUS | Status: AC
  Administered 2017-07-01 (×3): 10 meq via INTRAVENOUS
  Filled 2017-07-01 (×3): qty 100

## 2017-07-01 NOTE — [place] FL2 (Signed)
[place] MEDICAID FL2 LEVEL OF CARE SCREENING TOOL     IDENTIFICATION  Patient Name: Maria Christian Birthdate: 1925/02/11 Sex: female Admission Date (Current Location): 06/28/2017  [facility] [place] [facility] [place] Number:  Herbalist and Address:  [facility] [place], [place] [address] [place], [place] [address]      Provider Number: [id number]  Attending Physician Name and Address:  Oswald Hillock, MD  Relative Name and Phone Number:       Current Level of Care: Hospital Recommended Level of Care: [place] Prior Approval Number:    Date Approved/Denied:   PASRR Number:    Discharge Plan: SNF    Current Diagnoses: Patient Active Problem List   Diagnosis Date Noted  . UTI (urinary tract infection) 06/28/2017  . Acute bronchitis 04/16/2017  . Abnormal weight loss 03/02/2017  . TMJ (temporomandibular joint syndrome) 03/02/2017  . Shingles 12/11/2016  . Dilatation of colon   . Diarrhea 01/04/2016  . GERD (gastroesophageal reflux disease) 01/04/2016  . DM (diabetes mellitus), type 2 ([place]) 01/04/2016  . Hypertensive heart disease with congestive heart failure ([place]) 12/02/2015  . Vitamin B12 deficiency 09/27/2015  . Chronic respiratory failure ([place]) 03/16/2015  . Allergic rhinitis 03/16/2015  . Chronic pain 03/16/2015  . Osteoarthritis of multiple joints 03/16/2015  . Duodenal ulcer 03/12/2015  . Headache 03/09/2015  . Cephalgia 03/01/2015  . Constipation 02/27/2015  . Anemia of chronic disease 02/27/2015  . Chronic atrial fibrillation ([place]) 02/27/2015  . Hypokalemia 02/27/2015  . CKD (chronic kidney disease) stage 3, GFR 30-59 ml/min (HCC) 02/27/2015  . Ileus ([place]) 02/26/2015  . Malnutrition of moderate degree ([place]) 10/10/2013  . FTT (failure to thrive) in adult 07/29/2013  . Volvulus of sigmoid colon ([place]) 05/26/2013  . Dementia without behavioral disturbance 12/19/2012  . Depression 12/19/2012  . Coronary artery disease   .  Diastolic CHF, chronic ([place]) 05/24/2011    Orientation RESPIRATION BLADDER Height & Weight     Self, Time, Situation, Place  Normal Incontinent Weight: 152 lb 3.2 oz (69 kg) Height:  5\' 7"  (170.2 cm)  BEHAVIORAL SYMPTOMS/MOOD NEUROLOGICAL BOWEL NUTRITION STATUS      Incontinent Diet  AMBULATORY STATUS COMMUNICATION OF NEEDS Skin   Extensive Assist Verbally Normal                       Personal Care Assistance Level of Assistance  Bathing, Dressing Bathing Assistance: Maximum assistance   Dressing Assistance: Maximum assistance     Functional Limitations Info             SPECIAL CARE FACTORS FREQUENCY                       Contractures      Additional Factors Info  Code Status, Isolation Precautions, Allergies Code Status Info: DNR Allergies Info: Aspirin     Isolation Precautions Info: ESBL     Current Medications (07/01/2017):  This is the current hospital active medication list Current Facility-Administered Medications  Medication Dose Route Frequency Provider Last Rate Last Dose  . 0.9 %  sodium chloride infusion   Intravenous Continuous Oswald Hillock, MD 10 mL/hr at 07/01/17 [phone]    . acetaminophen (TYLENOL) tablet 650 mg  650 mg Oral Q6H PRN Arby Barrette A, NP   650 mg at 06/29/17 2345  . amLODipine (NORVASC) tablet 2.5 mg  2.5 mg Oral Daily Darrick Meigs, Marge Duncans, MD   2.5  mg at 07/01/17 1013  . baclofen (LIORESAL) tablet 5 mg  5 mg Oral QHS Oswald Hillock, MD   5 mg at 06/30/17 2139  . cyanocobalamin tablet 500 mcg  500 mcg Oral QPM Oswald Hillock, MD   500 mcg at 06/30/17 1711  . dorzolamide (TRUSOPT) 2 % ophthalmic solution 1 drop  1 drop Both Eyes TID Oswald Hillock, MD   1 drop at 07/01/17 1016  . escitalopram (LEXAPRO) tablet 10 mg  10 mg Oral Daily Oswald Hillock, MD   10 mg at 07/01/17 1013  . famotidine (PEPCID) tablet 20 mg  20 mg Oral QHS Oswald Hillock, MD   20 mg at 06/30/17 2139  . fluticasone (FLONASE) 50 MCG/ACT nasal spray 2 spray  2  spray Each Nare QHS Oswald Hillock, MD   2 spray at 06/30/17 2141  . fosfomycin (MONUROL) packet 3 g  3 g Oral Q72H Oswald Hillock, MD   3 g at 06/30/17 1132  . gabapentin (NEURONTIN) capsule 100 mg  100 mg Oral QHS Oswald Hillock, MD   100 mg at 06/30/17 2139  . guaiFENesin-dextromethorphan (ROBITUSSIN DM) 100-10 MG/5ML syrup 5 mL  5 mL Oral Q4H PRN Oswald Hillock, MD      . HYDROcodone-acetaminophen (NORCO/VICODIN) 5-325 MG per tablet 1 tablet  1 tablet Oral Q6H PRN Bodenheimer, Charles A, NP      . latanoprost (XALATAN) 0.005 % ophthalmic solution 1 drop  1 drop Both Eyes QHS Oswald Hillock, MD   1 drop at 06/30/17 2140  . loperamide (IMODIUM) capsule 2 mg  2 mg Oral TID PRN Oswald Hillock, MD   2 mg at 06/29/17 2228  . loratadine (CLARITIN) tablet 10 mg  10 mg Oral QAC breakfast Oswald Hillock, MD   10 mg at 07/01/17 0856  . ondansetron (ZOFRAN) tablet 4 mg  4 mg Oral Q6H PRN Oswald Hillock, MD       Or  . ondansetron (ZOFRAN) injection 4 mg  4 mg Intravenous Q6H PRN Oswald Hillock, MD         Discharge Medications: Please see discharge summary for a list of discharge medications.  Relevant Imaging Results:  Relevant Lab Results:   Additional Information SS# [id number]  Jorge Ny, LCSW

## 2017-07-01 NOTE — Progress Notes (Signed)
[facility]  PROGRESS NOTE  Maria Christian SNK:[id number] DOB: 1924-12-26 DOA: 06/28/2017 PCP: [name], DO   Brief HPI:   female, with history of atrial fibrillation, not on chronic anticoagulation, take aspirin for anticoagulation, depression, chronic respiratory failure, chronic constipation with history of recurrent sigmoid volvulus, hypertension, CKD stage III, chronic diastolic heart failure who came to hospital with chief complaints of diarrhea. Initially patient was sent from the skilled facility for concern for SBO as x-ray of the abdomen showed findings consistent with small bowel obstruction. Patient complains of abdominal pain with watery diarrhea for past three days. Also noted intermittent bright red blood in the stool. She has had poor PO intake. Denies any vomiting. Complains of dysuria      Subjective   Patient seen and examined, denies chest pain or shortness of breath.   Assessment/Plan:      1. UTI-patient has abnormal UA, she has history of ESBL E. coli. Started on Zosyn per pharmacy. Urine culture showed mixed forearm with concern for ESBL. Zosyn has been discontinued and patient started on Fosfomycin. 2. Diarrhea-patient was recently treated with Keflex for UTI, concern for C. diff. Stool for C. diff obtained, which is negative.CT of the abdomen and pelvis demonstrates no volvulus and no significant change from prior exam. 3. Atrial fibrillation-heart rate is controlled, patient takes aspirin for anticoagulation. Will hold aspirin this time due to recent history of blood in the stool. 4. Bradycardia-improved after stopping Coreg, patient heart rate dropped to 30s while sleeping, she has been symptomatic. Heart rate improved to 50s when she is awake. She has been taking Coreg 3.125 mg PO daily. Coreg was discontinued.  5. Acute kidney injury on CKD stage III-patient's creatinine was  31 /2.30 on admission, her baseline creatinine was 1.5 on 05/22/17.  Started on IV normal saline. Today her creatinine is 1.28. Continue IV fluids. Follow BMP in am. 6. Chronic diastolic CHF- patient is currently euvolemic, torsemide is on hold due to worsening renal function 7. Hypokalemia-potassium is 3.0, replace potassium and check BMP in am. Also check serum magnesium level. 8. History of recurrent sigmoid volvulus-CT abdomen shows no significant change from previous 9. Depression-continue Lexapro 10. Hypertension - continue Coreg, Norvasc      DVT prophylaxis: SCD's  Code Status: DNR  Family Communication: Discussed with daughter at bedside, son on phone  Disposition Plan: SNF in 2-3 days   Consultants:  None   Procedures:  None   Continuous infusions . sodium chloride 10 mL/hr at 07/01/17 0838      Antibiotics:   Anti-infectives (From admission, onward)   Start     Dose/Rate Route Frequency Ordered Stop   06/28/17 2330  piperacillin-tazobactam (ZOSYN) IVPB 2.25 g  Status:  Discontinued     2.25 g 100 mL/hr over 30 Minutes Intravenous Every 8 hours 06/28/17 1736 06/30/17 0901   06/28/17 1515  piperacillin-tazobactam (ZOSYN) IVPB 3.375 g     3.375 g 100 mL/hr over 30 Minutes Intravenous  Once 06/28/17 1504 06/28/17 1718       Objective   Vitals:   06/30/17 0708 06/30/17 1404 06/30/17 2048 07/01/17 0618  BP: (!) 130/59 (!) 121/47 (!) 129/58 (!) 151/70  Pulse: (!) 109 (!) 51 100 64  Resp: 20 14 16 16   Temp: 97.8 F (36.6 C) 98.2 F (36.8 C) 98.6 F (37 C) 98.4 F (36.9 C)  TempSrc: Oral Oral Oral Oral  SpO2: 98% 97% 95% 94%  Weight:      Height:  Intake/Output Summary (Last 24 hours) at 07/01/2017 1224 Last data filed at 07/01/2017 1121 Gross per 24 hour  Intake 2526.17 ml  Output -  Net 2526.17 ml   Filed Weights   06/28/17 1957  Weight: 69 kg (152 lb 3.2 oz)     Physical Examination:  Physical Exam: Eyes: No icterus, extraocular muscles intact  Mouth: Oral mucosa is moist, no lesions on  palate,  Neck: Supple, no deformities, masses, or tenderness Lungs: Normal respiratory effort, bilateral clear to auscultation, no crackles or wheezes.  Heart: Regular rate and rhythm, S1 and S2 normal, no murmurs, rubs auscultated Abdomen: BS normoactive,soft,nondistended,non-tender to palpation,no organomegaly Extremities: No pretibial edema, no erythema, no cyanosis, no clubbing Neuro : Alert and oriented to time, place and person, No focal deficits Skin: No rashes seen on exam     Data Reviewed: I have personally reviewed following labs and imaging studies  CBG: No results for input(s): GLUCAP in the last 168 hours.  CBC: Recent Labs  Lab 06/28/17 1213 06/28/17 1335 06/29/17 0425 06/30/17 0503  WBC 4.1  --  3.4* 3.1*  NEUTROABS 1.8  --   --   --   HGB 10.4* 10.5* 9.7* 9.2*  HCT 31.7* 31.0* 29.9* 27.5*  MCV 106.0*  --  105.7* 104.6*  PLT 88*  --  85* 74*    Basic Metabolic Panel: Recent Labs  Lab 06/28/17 1213 06/28/17 1335 06/29/17 0425 06/30/17 0503 07/01/17 0412 07/01/17 0900  NA 138 143 142 136 141  --   K 4.2 4.2 3.5 2.7* 3.0*  --   CL 111 110 115* 114* 120*  --   CO2 20*  --  19* 18* 17*  --   GLUCOSE 146* 141* 114* 102* 98  --   BUN 32* 31* 31* 23* 14  --   CREATININE 2.21* 2.30* 1.84* 1.69* 1.28*  --   CALCIUM 8.6*  --  8.4* 8.0* 8.0*  --   MG  --   --   --   --   --  1.8    Recent Results (from the past 240 hour(s))  Urine culture     Status: Abnormal   Collection Time: 06/28/17 12:13 PM  Result Value Ref Range Status   Specimen Description   Final    URINE, RANDOM Performed at [facility], [address], [place], [place] [address]    Special Requests NONE  Final   Culture MULTIPLE SPECIES PRESENT, SUGGEST RECOLLECTION (A)  Final   Report Status 06/29/2017 FINAL  Final  Gastrointestinal Panel by PCR , Stool     Status: None   Collection Time: 06/28/17  6:59 PM  Result Value Ref Range Status   Campylobacter species NOT DETECTED NOT  DETECTED Final   Plesimonas shigelloides NOT DETECTED NOT DETECTED Final   Salmonella species NOT DETECTED NOT DETECTED Final   Yersinia enterocolitica NOT DETECTED NOT DETECTED Final   Vibrio species NOT DETECTED NOT DETECTED Final   Vibrio cholerae NOT DETECTED NOT DETECTED Final   Enteroaggregative E coli (EAEC) NOT DETECTED NOT DETECTED Final   Enteropathogenic E coli (EPEC) NOT DETECTED NOT DETECTED Final   Enterotoxigenic E coli (ETEC) NOT DETECTED NOT DETECTED Final   Shiga like toxin producing E coli (STEC) NOT DETECTED NOT DETECTED Final   Shigella/Enteroinvasive E coli (EIEC) NOT DETECTED NOT DETECTED Final   Cryptosporidium NOT DETECTED NOT DETECTED Final   Cyclospora cayetanensis NOT DETECTED NOT DETECTED Final   Entamoeba histolytica NOT DETECTED  NOT DETECTED Final   Giardia lamblia NOT DETECTED NOT DETECTED Final   Adenovirus F40/41 NOT DETECTED NOT DETECTED Final   Astrovirus NOT DETECTED NOT DETECTED Final   Norovirus GI/GII NOT DETECTED NOT DETECTED Final   Rotavirus A NOT DETECTED NOT DETECTED Final   Sapovirus (I, II, IV, and V) NOT DETECTED NOT DETECTED Final    Comment: Performed at [facility], [place], [address] [place], [place] [address]  C difficile quick scan w PCR reflex     Status: None   Collection Time: 06/28/17  6:59 PM  Result Value Ref Range Status   C Diff antigen NEGATIVE NEGATIVE Final   C Diff toxin NEGATIVE NEGATIVE Final   C Diff interpretation No C. difficile detected.  Final  MRSA PCR Screening     Status: None   Collection Time: 06/28/17  8:00 PM  Result Value Ref Range Status   MRSA by PCR NEGATIVE NEGATIVE Final    Comment:        The GeneXpert MRSA Assay (FDA approved for NASAL specimens only), is one component of a comprehensive MRSA colonization surveillance program. It is not intended to diagnose MRSA infection nor to guide or monitor treatment for MRSA infections.      Liver Function Tests: Recent Labs  Lab  06/28/17 1213 06/29/17 0425  AST 28 22  ALT 14 13*  ALKPHOS 90 79  BILITOT 0.5 0.4  PROT 7.3 6.6  ALBUMIN 3.6 3.2*   No results for input(s): LIPASE, AMYLASE in the last 168 hours. No results for input(s): AMMONIA in the last 168 hours.  Cardiac Enzymes: No results for input(s): CKTOTAL, CKMB, CKMBINDEX, TROPONINI in the last 168 hours. BNP (last 3 results) Recent Labs    05/09/17 1706  BNP 429.7*    ProBNP (last 3 results) No results for input(s): PROBNP in the last 8760 hours.    Studies: No results found.  Scheduled Meds: . amLODipine  2.5 mg Oral Daily  . baclofen  5 mg Oral QHS  . cyanocobalamin  500 mcg Oral QPM  . dorzolamide  1 drop Both Eyes TID  . escitalopram  10 mg Oral Daily  . famotidine  20 mg Oral QHS  . fluticasone  2 spray Each Nare QHS  . fosfomycin  3 g Oral Q72H  . gabapentin  100 mg Oral QHS  . latanoprost  1 drop Both Eyes QHS  . loratadine  10 mg Oral QAC breakfast      Time spent: 25 min  [facility] Pager [phone]. If 7PM-7AM, please contact night-coverage at [email or web address], Office  [phone]  password [email or web address]  07/01/2017, 12:24 PM  LOS: 3 days

## 2017-07-02 LAB — BASIC METABOLIC PANEL
Anion gap: 6 (ref 5–15)
BUN: 8 mg/dL (ref 6–20)
CALCIUM: 8.4 mg/dL — AB (ref 8.9–10.3)
CO2: 18 mmol/L — ABNORMAL LOW (ref 22–32)
CREATININE: 1.01 mg/dL — AB (ref 0.44–1.00)
Chloride: 115 mmol/L — ABNORMAL HIGH (ref 101–111)
GFR calc non Af Amer: 48 mL/min — ABNORMAL LOW (ref >60)
GFR, EST AFRICAN AMERICAN: 55 mL/min — AB (ref >60)
Glucose, Bld: 138 mg/dL — ABNORMAL HIGH (ref 65–99)
Potassium: 2.7 mmol/L — CL (ref 3.5–5.1)
SODIUM: 139 mmol/L (ref 135–145)

## 2017-07-02 MED ORDER — POTASSIUM CHLORIDE 10 MEQ/100ML IV SOLN
10.0000 meq | INTRAVENOUS | Status: AC
  Administered 2017-07-02 (×4): 10 meq via INTRAVENOUS
  Filled 2017-07-02 (×4): qty 100

## 2017-07-02 MED ORDER — MAGNESIUM SULFATE 2 GM/50ML IV SOLN
2.0000 g | Freq: Once | INTRAVENOUS | Status: AC
  Administered 2017-07-02: 2 g via INTRAVENOUS
  Filled 2017-07-02: qty 50

## 2017-07-02 MED ORDER — POTASSIUM CHLORIDE CRYS ER 20 MEQ PO TBCR
40.0000 meq | EXTENDED_RELEASE_TABLET | Freq: Once | ORAL | Status: AC
  Administered 2017-07-02: 40 meq via ORAL
  Filled 2017-07-02: qty 2

## 2017-07-02 NOTE — Progress Notes (Signed)
CRITICAL VALUE ALERT  Critical Value:  K 2.7  Date & Time Notied:  07/02/2017 0648  Provider Notified: Bodenheimer  Orders Received/Actions taken: new orders given for runs of potassium

## 2017-07-02 NOTE — Care Management Important Message (Signed)
Important Message  Patient Details IM Letter given to Cookie/Case Manager to present to the Patient Name: Maria Christian MRN: [id number] Date of Birth: 1924-10-24   Medicare Important Message Given:  Yes    Kerin Salen 07/02/2017, 12:17 [place] Message  Patient Details  Name: Maria Christian MRN: [id number] Date of Birth: 1924-11-14   Medicare Important Message Given:  Yes    Kerin Salen 07/02/2017, 12:17 PM

## 2017-07-02 NOTE — Clinical Social Work Note (Signed)
Clinical Social Work Assessment  Patient Details  Name: Maria Christian MRN: [id number] Date of Birth: 1925/01/11  Date of referral:  07/02/17               Reason for consult:  Discharge Planning                Permission sought to share information with:  Facility Sport and exercise psychologist, Family Supports Permission granted to share information::     Name::        Agency::     Relationship::     Contact Information:     Housing/Transportation Living arrangements for the past 2 months:  Skilled Nursing Facility([facility]) Source of Information:  Adult Children (daughter Maria Christian) Patient Interpreter Needed:  None Criminal Activity/Legal Involvement Pertinent to Current Situation/Hospitalization:  No - Comment as needed Significant Relationships:  Adult Children Lives with:  Facility Resident Do you feel safe going back to the place where you live?  (Patient to return to [facility] for LTC) Need for family participation in patient care:  Yes (Comment)  Care giving concerns:  Patient from [facility] (Maria Christian term care resident). Patient's daughter reported that the plan is for patient to discharge back to SNF for [facility] term care.   Social Worker assessment / plan:  CSW spoke with patient's daughter regarding discharge planning. Patient's daughter reported that patient will dc back to SNF for [facility] term care. Patient's daughter reported that patient has been at [facility] for 4 years. Patient's daughter reported that patient will need [facility] for transport.  CSW contacted [facility] and spoke with admissions staff. Staff confirmed patient's ability to return at discharge.  CSW will continue to follow and assist with discharge planning.  Employment status:  Retired Nurse, adult PT Recommendations:  Not assessed at this time [place] / Referral to community resources:  (Patient from [facility] ([place] Resident))  Patient/Family's Response to  care:  Patient's daughter appreciative of CSW assistance with discharge planning.   Patient/Family's Understanding of and Emotional Response to Diagnosis, Current Treatment, and Prognosis:  Patient's daughter involved in patient's care and requested call from patient's attending MD to speak about patient's current treatment. CSW text paged patient's attending MD via [facility] and informed him of request. CSW contacted by patient's son who requested medical update, CSW provided patient's son withPatient's daughter verbalized plan for patient to discharge back to current SNF.   Emotional Assessment Appearance:    Attitude/Demeanor/Rapport:  Unable to Assess Affect (typically observed):  Unable to Assess Orientation:  Oriented to Self, Oriented to Place Alcohol / Substance use:  Not Applicable Psych involvement (Current and /or in the community):  No (Comment)  Discharge Needs  Concerns to be addressed:  Care Coordination Readmission within the last 30 days:  No Current discharge risk:  None Barriers to Discharge:  Continued Medical Work up   [employer], LCSW 07/02/2017, 11:37 AM

## 2017-07-02 NOTE — Progress Notes (Signed)
[facility]  PROGRESS NOTE  Maria Christian NKN:[id number] DOB: 1925-04-09 DOA: 06/28/2017 PCP: [name], DO   Brief HPI:   female, with history of atrial fibrillation, not on chronic anticoagulation, take aspirin for anticoagulation, depression, chronic respiratory failure, chronic constipation with history of recurrent sigmoid volvulus, hypertension, CKD stage III, chronic diastolic heart failure who came to hospital with chief complaints of diarrhea. Initially patient was sent from the skilled facility for concern for SBO as x-ray of the abdomen showed findings consistent with small bowel obstruction. Patient complains of abdominal pain with watery diarrhea for past three days. Also noted intermittent bright red blood in the stool. She has had poor PO intake. Denies any vomiting. Complains of dysuria      Subjective   Patient seen and examined, diarrhea has improved.   Assessment/Plan:      1. UTI-patient has abnormal UA, she has history of ESBL E. coli. Started on Zosyn per pharmacy. Urine culture showed mixed forearm with concern for ESBL. Zosyn has been discontinued and patient started on Fosfomycin. 2. Diarrhea-improving, started on Imodium PRN ,patient was recently treated with Keflex for UTI, concern for C. diff. Stool for C. diff obtained, which is negative.CT of the abdomen and pelvis demonstrates no volvulus and no significant change from prior exam. 3. Atrial fibrillation-heart rate is controlled, patient takes aspirin for anticoagulation. Will hold aspirin this time due to recent history of blood in the stool. 4. Bradycardia-improved after stopping Coreg, patient heart rate dropped to 30s while sleeping, she has been symptomatic. Heart rate improved to 50s when she is awake. She has been taking Coreg 3.125 mg PO daily. Coreg was discontinued.  5. Acute kidney injury on CKD stage III-patient's creatinine was  31 /2.30 on admission, her baseline creatinine was 1.5 on  05/22/17. Started on IV normal saline. Today her creatinine is 1.01. Change IV fluids to [facility]. 6. Chronic diastolic CHF- patient is currently euvolemic, torsemide is on hold due to worsening renal function 7. Hypokalemia-potassium is 2.7, replace potassium and check BMP in am.  8. History of recurrent sigmoid volvulus-CT abdomen shows no significant change from previous 9. Depression-continue Lexapro 10. Hypertension - continue Coreg, Norvasc      DVT prophylaxis: SCD's  Code Status: DNR  Family Communication: Discussed with daughter at bedside, son on phone  Disposition Plan: SNF in 2-3 days   Consultants:  None   Procedures:  None   Continuous infusions . sodium chloride 10 mL/hr at 07/01/17 0838      Antibiotics:   Anti-infectives (From admission, onward)   Start     Dose/Rate Route Frequency Ordered Stop   06/28/17 2330  piperacillin-tazobactam (ZOSYN) IVPB 2.25 g  Status:  Discontinued     2.25 g 100 mL/hr over 30 Minutes Intravenous Every 8 hours 06/28/17 1736 06/30/17 0901   06/28/17 1515  piperacillin-tazobactam (ZOSYN) IVPB 3.375 g     3.375 g 100 mL/hr over 30 Minutes Intravenous  Once 06/28/17 1504 06/28/17 1718       Objective   Vitals:   07/01/17 0618 07/01/17 2141 07/02/17 0601 07/02/17 1318  BP: (!) 151/70 (!) 155/79 (!) 145/69 123/63  Pulse: 64 84 73 63  Resp: 16 16 18 16   Temp: 98.4 F (36.9 C) 99 F (37.2 C) 98.8 F (37.1 C) 98.1 F (36.7 C)  TempSrc: Oral Oral Oral Oral  SpO2: 94% 95% 94% 95%  Weight:      Height:        Intake/Output  Summary (Last 24 hours) at 07/02/2017 1330 Last data filed at 07/02/2017 1102 Gross per 24 hour  Intake 260 ml  Output -  Net 260 ml   Filed Weights   06/28/17 1957  Weight: 69 kg (152 lb 3.2 oz)     Physical Examination:  Physical Exam: Eyes: No icterus, extraocular muscles intact  Mouth: Oral mucosa is moist, no lesions on palate,  Neck: Supple, no deformities, masses, or  tenderness Lungs: Normal respiratory effort, bilateral clear to auscultation, no crackles or wheezes.  Heart: Regular rate and rhythm, S1 and S2 normal, no murmurs, rubs auscultated Abdomen: BS normoactive,soft,nondistended,non-tender to palpation,no organomegaly Extremities: No pretibial edema, no erythema, no cyanosis, no clubbing Neuro : Alert and oriented to time, place and person, No focal deficits      Data Reviewed: I have personally reviewed following labs and imaging studies  CBG: No results for input(s): GLUCAP in the last 168 hours.  CBC: Recent Labs  Lab 06/28/17 1213 06/28/17 1335 06/29/17 0425 06/30/17 0503  WBC 4.1  --  3.4* 3.1*  NEUTROABS 1.8  --   --   --   HGB 10.4* 10.5* 9.7* 9.2*  HCT 31.7* 31.0* 29.9* 27.5*  MCV 106.0*  --  105.7* 104.6*  PLT 88*  --  85* 74*    Basic Metabolic Panel: Recent Labs  Lab 06/28/17 1213 06/28/17 1335 06/29/17 0425 06/30/17 0503 07/01/17 0412 07/01/17 0900 07/02/17 0433  NA 138 143 142 136 141  --  139  K 4.2 4.2 3.5 2.7* 3.0*  --  2.7*  CL 111 110 115* 114* 120*  --  115*  CO2 20*  --  19* 18* 17*  --  18*  GLUCOSE 146* 141* 114* 102* 98  --  138*  BUN 32* 31* 31* 23* 14  --  8  CREATININE 2.21* 2.30* 1.84* 1.69* 1.28*  --  1.01*  CALCIUM 8.6*  --  8.4* 8.0* 8.0*  --  8.4*  MG  --   --   --   --   --  1.8  --     Recent Results (from the past 240 hour(s))  Urine culture     Status: Abnormal   Collection Time: 06/28/17 12:13 PM  Result Value Ref Range Status   Specimen Description   Final    URINE, RANDOM Performed at [facility], [address] [place], [place] [address]    Special Requests NONE  Final   Culture MULTIPLE SPECIES PRESENT, SUGGEST RECOLLECTION (A)  Final   Report Status 06/29/2017 FINAL  Final  Gastrointestinal Panel by PCR , Stool     Status: None   Collection Time: 06/28/17  6:59 PM  Result Value Ref Range Status   Campylobacter species NOT DETECTED NOT DETECTED Final    Plesimonas shigelloides NOT DETECTED NOT DETECTED Final   Salmonella species NOT DETECTED NOT DETECTED Final   Yersinia enterocolitica NOT DETECTED NOT DETECTED Final   Vibrio species NOT DETECTED NOT DETECTED Final   Vibrio cholerae NOT DETECTED NOT DETECTED Final   Enteroaggregative E coli (EAEC) NOT DETECTED NOT DETECTED Final   Enteropathogenic E coli (EPEC) NOT DETECTED NOT DETECTED Final   Enterotoxigenic E coli (ETEC) NOT DETECTED NOT DETECTED Final   Shiga like toxin producing E coli (STEC) NOT DETECTED NOT DETECTED Final   Shigella/Enteroinvasive E coli (EIEC) NOT DETECTED NOT DETECTED Final   Cryptosporidium NOT DETECTED NOT DETECTED Final   Cyclospora cayetanensis NOT DETECTED NOT DETECTED  Final   Entamoeba histolytica NOT DETECTED NOT DETECTED Final   Giardia lamblia NOT DETECTED NOT DETECTED Final   Adenovirus F40/41 NOT DETECTED NOT DETECTED Final   Astrovirus NOT DETECTED NOT DETECTED Final   Norovirus GI/GII NOT DETECTED NOT DETECTED Final   Rotavirus A NOT DETECTED NOT DETECTED Final   Sapovirus (I, II, IV, and V) NOT DETECTED NOT DETECTED Final    Comment: Performed at [facility], [address] [place], [place] [address]  C difficile quick scan w PCR reflex     Status: None   Collection Time: 06/28/17  6:59 PM  Result Value Ref Range Status   C Diff antigen NEGATIVE NEGATIVE Final   C Diff toxin NEGATIVE NEGATIVE Final   C Diff interpretation No C. difficile detected.  Final  MRSA PCR Screening     Status: None   Collection Time: 06/28/17  8:00 PM  Result Value Ref Range Status   MRSA by PCR NEGATIVE NEGATIVE Final    Comment:        The GeneXpert MRSA Assay (FDA approved for NASAL specimens only), is one component of a comprehensive MRSA colonization surveillance program. It is not intended to diagnose MRSA infection nor to guide or monitor treatment for MRSA infections.      Liver Function Tests: Recent Labs  Lab 06/28/17 1213  06/29/17 0425  AST 28 22  ALT 14 13*  ALKPHOS 90 79  BILITOT 0.5 0.4  PROT 7.3 6.6  ALBUMIN 3.6 3.2*   No results for input(s): LIPASE, AMYLASE in the last 168 hours. No results for input(s): AMMONIA in the last 168 hours.  Cardiac Enzymes: No results for input(s): CKTOTAL, CKMB, CKMBINDEX, TROPONINI in the last 168 hours. BNP (last 3 results) Recent Labs    05/09/17 1706  BNP 429.7*    ProBNP (last 3 results) No results for input(s): PROBNP in the last 8760 hours.    Studies: No results found.  Scheduled Meds: . amLODipine  2.5 mg Oral Daily  . baclofen  5 mg Oral QHS  . cyanocobalamin  500 mcg Oral QPM  . dorzolamide  1 drop Both Eyes TID  . escitalopram  10 mg Oral Daily  . famotidine  20 mg Oral QHS  . fluticasone  2 spray Each Nare QHS  . fosfomycin  3 g Oral Q72H  . gabapentin  100 mg Oral QHS  . latanoprost  1 drop Both Eyes QHS  . loratadine  10 mg Oral QAC breakfast      Time spent: 25 min  [facility] Pager [phone]. If 7PM-7AM, please contact night-coverage at [email or web address], Office  [phone]  password [email or web address]  07/02/2017, 1:30 PM  LOS: 4 days

## 2017-07-03 ENCOUNTER — Inpatient Hospital Stay (HOSPITAL_COMMUNITY): Payer: Medicare Other

## 2017-07-03 DIAGNOSIS — R5383 Other fatigue: Secondary | ICD-10-CM

## 2017-07-03 DIAGNOSIS — M25552 Pain in left hip: Secondary | ICD-10-CM

## 2017-07-03 DIAGNOSIS — M25559 Pain in unspecified hip: Secondary | ICD-10-CM

## 2017-07-03 DIAGNOSIS — R5381 Other malaise: Secondary | ICD-10-CM

## 2017-07-03 LAB — BASIC METABOLIC PANEL
ANION GAP: 5 (ref 5–15)
BUN: 8 mg/dL (ref 6–20)
CALCIUM: 8.5 mg/dL — AB (ref 8.9–10.3)
CO2: 18 mmol/L — AB (ref 22–32)
Chloride: 116 mmol/L — ABNORMAL HIGH (ref 101–111)
Creatinine, Ser: 0.97 mg/dL (ref 0.44–1.00)
GFR calc non Af Amer: 50 mL/min — ABNORMAL LOW (ref >60)
GFR, EST AFRICAN AMERICAN: 58 mL/min — AB (ref >60)
Glucose, Bld: 131 mg/dL — ABNORMAL HIGH (ref 65–99)
POTASSIUM: 2.8 mmol/L — AB (ref 3.5–5.1)
Sodium: 139 mmol/L (ref 135–145)

## 2017-07-03 MED ORDER — POTASSIUM CHLORIDE 10 MEQ/100ML IV SOLN
10.0000 meq | INTRAVENOUS | Status: AC
  Administered 2017-07-03 (×6): 10 meq via INTRAVENOUS
  Filled 2017-07-03 (×6): qty 100

## 2017-07-03 MED ORDER — CYCLOBENZAPRINE HCL 5 MG PO TABS
5.0000 mg | ORAL_TABLET | Freq: Three times a day (TID) | ORAL | Status: AC
  Administered 2017-07-03 (×3): 5 mg via ORAL
  Filled 2017-07-03 (×3): qty 1

## 2017-07-03 MED ORDER — CYCLOBENZAPRINE HCL 5 MG PO TABS: 5.0000 mg | ORAL_TABLET | Freq: Three times a day (TID) | ORAL | Status: DC

## 2017-07-03 NOTE — Progress Notes (Addendum)
[facility]  PROGRESS NOTE  Maria Christian EPP:[id number] DOB: June 26, 1924 DOA: 06/28/2017 PCP: [name], DO   Brief HPI:   female, with history of atrial fibrillation, not on chronic anticoagulation, take aspirin for anticoagulation, depression, chronic respiratory failure, chronic constipation with history of recurrent sigmoid volvulus, hypertension, CKD stage III, chronic diastolic heart failure who came to hospital with chief complaints of diarrhea. Initially patient was sent from the skilled facility for concern for SBO as x-ray of the abdomen showed findings consistent with small bowel obstruction. Patient complains of abdominal pain with watery diarrhea for past three days. Also noted intermittent bright red blood in the stool. She has had poor PO intake. Denies any vomiting. Complains of dysuria    Subjective   Patient seen and examined, complains of neck pain and hip pain.   Assessment/Plan:     1. UTI-patient has abnormal UA, she has history of ESBL E. coli. Started on Zosyn per pharmacy. Urine culture showed mixed forearm with concern for ESBL. Zosyn has been discontinued and patient started on Fosfomycin. Patient will get total three doses this antibiotic Q 72 hours 2. Neck pain-likely muscle spasm will start Flexeril 5 mg PO TID 3. Left hip pain-complains of left hip pain, will obtain x-ray of the hips and pelvis. 4. Diarrhea-improving, started on Imodium PRN ,patient was recently treated with Keflex for UTI, concern for C. diff. Stool for C. diff obtained, which is negative.CT of the abdomen and pelvis demonstrates no volvulus and no significant change from prior exam. 5. Atrial fibrillation-heart rate is controlled, patient takes aspirin for anticoagulation. Will hold aspirin this time due to recent history of blood in the stool. 6. Bradycardia-improved after stopping Coreg, patient heart rate dropped to 30s while sleeping, she has been symptomatic. Heart rate  improved to 50s when she is awake. She has been taking Coreg 3.125 mg PO daily. Coreg was discontinued.  7. Acute kidney injury on CKD stage III-patient's creatinine was  31 /2.30 on admission, her baseline creatinine was 1.5 on 05/22/17. Started on IV normal saline. Today her creatinine is 1.01. Change IV fluids to [facility]. 8. Chronic diastolic CHF- patient is currently euvolemic, torsemide is on hold due to worsening renal function 9. Hypokalemia-not improving despite aggressive replacement potassium is 2.8, replace potassium and check BMP in am.  10. History of recurrent sigmoid volvulus-CT abdomen shows no significant change from previous. 11. Depression-continue Lexapro 12. Hypertension - continue Coreg, Norvasc    DVT prophylaxis: SCD's  Code Status: DNR  Family Communication: Discussed with daughter at bedside, son on phone  Disposition Plan: SNF in 2-3 days   Consultants:  None   Procedures:  None   Continuous infusions . sodium chloride 10 mL/hr at 07/03/17 0902  . potassium chloride 10 mEq (07/03/17 1034)      Antibiotics:   Anti-infectives (From admission, onward)   Start     Dose/Rate Route Frequency Ordered Stop   06/28/17 2330  piperacillin-tazobactam (ZOSYN) IVPB 2.25 g  Status:  Discontinued     2.25 g 100 mL/hr over 30 Minutes Intravenous Every 8 hours 06/28/17 1736 06/30/17 0901   06/28/17 1515  piperacillin-tazobactam (ZOSYN) IVPB 3.375 g     3.375 g 100 mL/hr over 30 Minutes Intravenous  Once 06/28/17 1504 06/28/17 1718       Objective   Vitals:   07/02/17 1318 07/02/17 2045 07/03/17 0512 07/03/17 0850  BP: 123/63 133/67 (!) 145/71 (!) 143/75  Pulse: 63 84 86   Resp:  16 18 18    Temp: 98.1 F (36.7 C) 99.4 F (37.4 C) 98.4 F (36.9 C)   TempSrc: Oral Oral Oral   SpO2: 95% 98% 94%   Weight:      Height:        Intake/Output Summary (Last 24 hours) at 07/03/2017 1112 Last data filed at 07/03/2017 1610 Gross per 24 hour  Intake 240 ml   Output 1 ml  Net 239 ml   Filed Weights   06/28/17 1957  Weight: 69 kg (152 lb 3.2 oz)     Physical Examination:  Physical Exam: Eyes: No icterus, extraocular muscles intact  Mouth: Oral mucosa is moist, no lesions on palate,  Neck: Supple, no deformities, masses, or tenderness Lungs: Normal respiratory effort, bilateral clear to auscultation, no crackles or wheezes.  Heart: Regular rate and rhythm, S1 and S2 normal, no murmurs, rubs auscultated Abdomen: BS normoactive,soft,nondistended,non-tender to palpation,no organomegaly Extremities: No pretibial edema, no erythema, no cyanosis, no clubbing Neuro : Alert and oriented to time, place and person, No focal deficits       Data Reviewed: I have personally reviewed following labs and imaging studies  CBG: No results for input(s): GLUCAP in the last 168 hours.  CBC: Recent Labs  Lab 06/28/17 1213 06/28/17 1335 06/29/17 0425 06/30/17 0503  WBC 4.1  --  3.4* 3.1*  NEUTROABS 1.8  --   --   --   HGB 10.4* 10.5* 9.7* 9.2*  HCT 31.7* 31.0* 29.9* 27.5*  MCV 106.0*  --  105.7* 104.6*  PLT 88*  --  85* 74*    Basic Metabolic Panel: Recent Labs  Lab 06/29/17 0425 06/30/17 0503 07/01/17 0412 07/01/17 0900 07/02/17 0433 07/03/17 0459  NA 142 136 141  --  139 139  K 3.5 2.7* 3.0*  --  2.7* 2.8*  CL 115* 114* 120*  --  115* 116*  CO2 19* 18* 17*  --  18* 18*  GLUCOSE 114* 102* 98  --  138* 131*  BUN 31* 23* 14  --  8 8  CREATININE 1.84* 1.69* 1.28*  --  1.01* 0.97  CALCIUM 8.4* 8.0* 8.0*  --  8.4* 8.5*  MG  --   --   --  1.8  --   --     Recent Results (from the past 240 hour(s))  Urine culture     Status: Abnormal   Collection Time: 06/28/17 12:13 PM  Result Value Ref Range Status   Specimen Description   Final    URINE, RANDOM Performed at [facility] [address], [place], [place] [address]    Special Requests NONE  Final   Culture MULTIPLE SPECIES PRESENT, SUGGEST RECOLLECTION (A)  Final    Report Status 06/29/2017 FINAL  Final  Gastrointestinal Panel by PCR , Stool     Status: None   Collection Time: 06/28/17  6:59 PM  Result Value Ref Range Status   Campylobacter species NOT DETECTED NOT DETECTED Final   Plesimonas shigelloides NOT DETECTED NOT DETECTED Final   Salmonella species NOT DETECTED NOT DETECTED Final   Yersinia enterocolitica NOT DETECTED NOT DETECTED Final   Vibrio species NOT DETECTED NOT DETECTED Final   Vibrio cholerae NOT DETECTED NOT DETECTED Final   Enteroaggregative E coli (EAEC) NOT DETECTED NOT DETECTED Final   Enteropathogenic E coli (EPEC) NOT DETECTED NOT DETECTED Final   Enterotoxigenic E coli (ETEC) NOT DETECTED NOT DETECTED Final   Shiga like toxin producing E coli (STEC)  NOT DETECTED NOT DETECTED Final   Shigella/Enteroinvasive E coli (EIEC) NOT DETECTED NOT DETECTED Final   Cryptosporidium NOT DETECTED NOT DETECTED Final   Cyclospora cayetanensis NOT DETECTED NOT DETECTED Final   Entamoeba histolytica NOT DETECTED NOT DETECTED Final   Giardia lamblia NOT DETECTED NOT DETECTED Final   Adenovirus F40/41 NOT DETECTED NOT DETECTED Final   Astrovirus NOT DETECTED NOT DETECTED Final   Norovirus GI/GII NOT DETECTED NOT DETECTED Final   Rotavirus A NOT DETECTED NOT DETECTED Final   Sapovirus (I, II, IV, and V) NOT DETECTED NOT DETECTED Final    Comment: Performed at [facility], [address] [place], [place] [address]  C difficile quick scan w PCR reflex     Status: None   Collection Time: 06/28/17  6:59 PM  Result Value Ref Range Status   C Diff antigen NEGATIVE NEGATIVE Final   C Diff toxin NEGATIVE NEGATIVE Final   C Diff interpretation No C. difficile detected.  Final  MRSA PCR Screening     Status: None   Collection Time: 06/28/17  8:00 PM  Result Value Ref Range Status   MRSA by PCR NEGATIVE NEGATIVE Final    Comment:        The GeneXpert MRSA Assay (FDA approved for NASAL specimens only), is one component of  a comprehensive MRSA colonization surveillance program. It is not intended to diagnose MRSA infection nor to guide or monitor treatment for MRSA infections.      Liver Function Tests: Recent Labs  Lab 06/28/17 1213 06/29/17 0425  AST 28 22  ALT 14 13*  ALKPHOS 90 79  BILITOT 0.5 0.4  PROT 7.3 6.6  ALBUMIN 3.6 3.2*   No results for input(s): LIPASE, AMYLASE in the last 168 hours. No results for input(s): AMMONIA in the last 168 hours.  Cardiac Enzymes: No results for input(s): CKTOTAL, CKMB, CKMBINDEX, TROPONINI in the last 168 hours. BNP (last 3 results) Recent Labs    05/09/17 1706  BNP 429.7*    ProBNP (last 3 results) No results for input(s): PROBNP in the last 8760 hours.    Studies: No results found.  Scheduled Meds: . amLODipine  2.5 mg Oral Daily  . baclofen  5 mg Oral QHS  . cyanocobalamin  500 mcg Oral QPM  . cyclobenzaprine  5 mg Oral TID  . dorzolamide  1 drop Both Eyes TID  . escitalopram  10 mg Oral Daily  . famotidine  20 mg Oral QHS  . fluticasone  2 spray Each Nare QHS  . fosfomycin  3 g Oral Q72H  . gabapentin  100 mg Oral QHS  . latanoprost  1 drop Both Eyes QHS  . loratadine  10 mg Oral QAC breakfast      Time spent: 25 min  [facility] Pager [phone]. If 7PM-7AM, please contact night-coverage at [email or web address], Office  [phone]  password [email or web address]  07/03/2017, 11:12 AM  LOS: 5 days

## 2017-07-03 NOTE — Progress Notes (Signed)
Medications administered by student RN [id number] with supervision of Clinical Instructor Suhani Stillion MSN, RN-BC or patient's assigned RN.   

## 2017-07-03 NOTE — Progress Notes (Signed)
Pt having large clear, mucous-like stools.  Dr. Darrick Meigs made aware.  Will continue to monitor closely.

## 2017-07-04 DIAGNOSIS — N3001 Acute cystitis with hematuria: Secondary | ICD-10-CM

## 2017-07-04 LAB — MAGNESIUM: Magnesium: 1.9 mg/dL (ref 1.7–2.4)

## 2017-07-04 LAB — BASIC METABOLIC PANEL
ANION GAP: 5 (ref 5–15)
BUN: 8 mg/dL (ref 6–20)
CALCIUM: 8.3 mg/dL — AB (ref 8.9–10.3)
CO2: 17 mmol/L — AB (ref 22–32)
Chloride: 118 mmol/L — ABNORMAL HIGH (ref 101–111)
Creatinine, Ser: 1.01 mg/dL — ABNORMAL HIGH (ref 0.44–1.00)
GFR calc Af Amer: 55 mL/min — ABNORMAL LOW (ref >60)
GFR calc non Af Amer: 48 mL/min — ABNORMAL LOW (ref >60)
GLUCOSE: 106 mg/dL — AB (ref 65–99)
Potassium: 2.9 mmol/L — ABNORMAL LOW (ref 3.5–5.1)
Sodium: 140 mmol/L (ref 135–145)

## 2017-07-04 MED ORDER — CYCLOBENZAPRINE HCL 5 MG PO TABS: 5.0000 mg | ORAL_TABLET | Freq: Three times a day (TID) | ORAL | Status: DC | PRN

## 2017-07-04 MED ORDER — POTASSIUM CHLORIDE 10 MEQ/100ML IV SOLN
10.0000 meq | INTRAVENOUS | Status: AC
  Administered 2017-07-04 (×2): 10 meq via INTRAVENOUS
  Filled 2017-07-04 (×2): qty 100

## 2017-07-04 MED ORDER — POTASSIUM CHLORIDE CRYS ER 20 MEQ PO TBCR
40.0000 meq | EXTENDED_RELEASE_TABLET | ORAL | Status: AC
  Administered 2017-07-04 (×2): 40 meq via ORAL
  Filled 2017-07-04 (×2): qty 2

## 2017-07-04 NOTE — Progress Notes (Signed)
PROGRESS NOTE    Maria Christian  JTT:[id number] DOB: 09-02-1924 DOA: 06/28/2017 PCP: [name], DO   Brief Narrative:  female,with history of atrial fibrillation, not on chronic anticoagulation, take aspirin for anticoagulation, depression, chronic respiratory failure,chronic constipation with history of recurrent sigmoid volvulus,hypertension, CKD stage III, chronic diastolic heart failure who came to hospital with chief complaints of diarrhea.Initially patient was sent from the skilled facility for concern for SBO as x-ray of the abdomen showed findings consistent with small bowel obstruction. Patient complains of abdominal pain with watery diarrhea for past three days. Also noted intermittent bright red blood in the stool. She has had poor PO intake. Denies any vomiting. Complains of dysuria  Assessment & Plan:   Active Problems:   Volvulus of sigmoid colon (HCC)   Diarrhea   UTI (urinary tract infection)   1. UTI-patient has abnormal UA,she has history of ESBL E. coli. Started on Zosyn per pharmacy. Urine culture showed mixed species.  Pt with history of ESBL and started on fosfomycin (1/26, 1/29, and 1 additional dose). Zosyn has been discontinued and patient started on Fosfomycin. Patient will get total three doses this antibiotic Q 72 hours  2. Neck pain- Notes improved today.  Continue Flexeril 5 mg PO TID as needed.  3. Left hip pain-complains of left hip pain.  X rays without fracture.  Well positioned L hip arthroplasty.   4. Diarrhea- improving, started on Imodium PRN.  Negative C diff.  CT of the abdomen and pelvis demonstrates no volvulus and no significant change from prior exam.  Continue to monitor.   5. Atrial fibrillation-heart rate is controlled, patient takes aspirin for anticoagulation. Will hold aspirin this time due to recent history of blood in the stool.  6. Bradycardia- improved after stopping Coreg, patient heart rate dropped to 30s while sleeping,  she has been asymptomatic. HR currently improved.    7. Acute kidney injury on CKD stage III-patient's creatinine peaked to 2.3 on admission.  Baseline creatinine is 1.4-1.5. Creatinine continuing to improve.  Continue to monitor.   8. Chronic diastolic CHF-patient is currentlyeuvolemic, torsemide is on hold.   9. Hypokalemia- follow magnesium.  Replete 40 BID PO and 20 IV today (has been persistently low).  10. History of recurrent sigmoid volvulus-CT abdomen shows no significant change from previous.  11. Depression-continue Lexapro  12. Hypertension-continue Coreg, Norvasc  13. NAGMA: chloride 118, bicarb 17-18 consistently over past few days.  Continue to monitor for now  DVT prophylaxis: SCD Code Status: DNR Family Communication: none at bedside Disposition Plan: pending  Consultants:   none  Procedures:   none  Antimicrobials:  Anti-infectives (From admission, onward)   Start     Dose/Rate Route Frequency Ordered Stop   06/28/17 2330  piperacillin-tazobactam (ZOSYN) IVPB 2.25 g  Status:  Discontinued     2.25 g 100 mL/hr over 30 Minutes Intravenous Every 8 hours 06/28/17 1736 06/30/17 0901   06/28/17 1515  piperacillin-tazobactam (ZOSYN) IVPB 3.375 g     3.375 g 100 mL/hr over 30 Minutes Intravenous  Once 06/28/17 1504 06/28/17 1718    Fosfomycin 1/26, 1/29 Subjective: Denies pain. A&Ox3.   Objective: Vitals:   07/03/17 1339 07/03/17 2205 07/04/17 0300 07/04/17 1020  BP: 135/69 140/66 126/68 (!) 146/71  Pulse: 73 81 89   Resp: 16 18 19    Temp: 98.7 F (37.1 C) 99 F (37.2 C) 98.2 F (36.8 C)   TempSrc: Oral Oral Oral   SpO2: 97% 96% 95%  Weight:      Height:        Intake/Output Summary (Last 24 hours) at 07/04/2017 1152 Last data filed at 07/04/2017 0951 Gross per 24 hour  Intake 1330 ml  Output 1 ml  Net 1329 ml   Filed Weights   06/28/17 1957  Weight: 69 kg (152 lb 3.2 oz)    Examination:  General exam: Appears calm and  comfortable  Respiratory system: Clear to auscultation. Respiratory effort normal. Cardiovascular system: S1 & S2 heard, RRR. No JVD, murmurs, rubs, gallops or clicks. No pedal edema. Gastrointestinal system: Abdomen is distended, soft and nontender. No organomegaly or masses felt.  Central nervous system: Alert and oriented. No focal neurological deficits appreciated. Extremities: no LEE Skin: No rashes, lesions or ulcers Psychiatry: Judgement and insight appear normal. Mood & affect appropriate.     Data Reviewed: I have personally reviewed following labs and imaging studies  CBC: Recent Labs  Lab 06/28/17 1213 06/28/17 1335 06/29/17 0425 06/30/17 0503  WBC 4.1  --  3.4* 3.1*  NEUTROABS 1.8  --   --   --   HGB 10.4* 10.5* 9.7* 9.2*  HCT 31.7* 31.0* 29.9* 27.5*  MCV 106.0*  --  105.7* 104.6*  PLT 88*  --  85* 74*   Basic Metabolic Panel: Recent Labs  Lab 06/30/17 0503 07/01/17 0412 07/01/17 0900 07/02/17 0433 07/03/17 0459 07/04/17 0510  NA 136 141  --  139 139 140  K 2.7* 3.0*  --  2.7* 2.8* 2.9*  CL 114* 120*  --  115* 116* 118*  CO2 18* 17*  --  18* 18* 17*  GLUCOSE 102* 98  --  138* 131* 106*  BUN 23* 14  --  8 8 8   CREATININE 1.69* 1.28*  --  1.01* 0.97 1.01*  CALCIUM 8.0* 8.0*  --  8.4* 8.5* 8.3*  MG  --   --  1.8  --   --  1.9   GFR: Estimated Creatinine Clearance: 36 mL/min (A) (by C-G formula based on SCr of 1.01 mg/dL (H)). Liver Function Tests: Recent Labs  Lab 06/28/17 1213 06/29/17 0425  AST 28 22  ALT 14 13*  ALKPHOS 90 79  BILITOT 0.5 0.4  PROT 7.3 6.6  ALBUMIN 3.6 3.2*   No results for input(s): LIPASE, AMYLASE in the last 168 hours. No results for input(s): AMMONIA in the last 168 hours. Coagulation Profile: No results for input(s): INR, PROTIME in the last 168 hours. Cardiac Enzymes: No results for input(s): CKTOTAL, CKMB, CKMBINDEX, TROPONINI in the last 168 hours. BNP (last 3 results) No results for input(s): PROBNP in the last  8760 hours. HbA1C: No results for input(s): HGBA1C in the last 72 hours. CBG: No results for input(s): GLUCAP in the last 168 hours. Lipid Profile: No results for input(s): CHOL, HDL, LDLCALC, TRIG, CHOLHDL, LDLDIRECT in the last 72 hours. Thyroid Function Tests: No results for input(s): TSH, T4TOTAL, FREET4, T3FREE, THYROIDAB in the last 72 hours. Anemia Panel: No results for input(s): VITAMINB12, FOLATE, FERRITIN, TIBC, IRON, RETICCTPCT in the last 72 hours. Sepsis Labs: Recent Labs  Lab 06/28/17 1335  LATICACIDVEN 0.77    Recent Results (from the past 240 hour(s))  Urine culture     Status: Abnormal   Collection Time: 06/28/17 12:13 PM  Result Value Ref Range Status   Specimen Description   Final    URINE, RANDOM Performed at [facility], [place] [address], [place], [place] [address]  Special Requests NONE  Final   Culture MULTIPLE SPECIES PRESENT, SUGGEST RECOLLECTION (A)  Final   Report Status 06/29/2017 FINAL  Final  Gastrointestinal Panel by PCR , Stool     Status: None   Collection Time: 06/28/17  6:59 PM  Result Value Ref Range Status   Campylobacter species NOT DETECTED NOT DETECTED Final   Plesimonas shigelloides NOT DETECTED NOT DETECTED Final   Salmonella species NOT DETECTED NOT DETECTED Final   Yersinia enterocolitica NOT DETECTED NOT DETECTED Final   Vibrio species NOT DETECTED NOT DETECTED Final   Vibrio cholerae NOT DETECTED NOT DETECTED Final   Enteroaggregative E coli (EAEC) NOT DETECTED NOT DETECTED Final   Enteropathogenic E coli (EPEC) NOT DETECTED NOT DETECTED Final   Enterotoxigenic E coli (ETEC) NOT DETECTED NOT DETECTED Final   Shiga like toxin producing E coli (STEC) NOT DETECTED NOT DETECTED Final   Shigella/Enteroinvasive E coli (EIEC) NOT DETECTED NOT DETECTED Final   Cryptosporidium NOT DETECTED NOT DETECTED Final   Cyclospora cayetanensis NOT DETECTED NOT DETECTED Final   Entamoeba histolytica NOT DETECTED NOT DETECTED Final    Giardia lamblia NOT DETECTED NOT DETECTED Final   Adenovirus F40/41 NOT DETECTED NOT DETECTED Final   Astrovirus NOT DETECTED NOT DETECTED Final   Norovirus GI/GII NOT DETECTED NOT DETECTED Final   Rotavirus A NOT DETECTED NOT DETECTED Final   Sapovirus (I, II, IV, and V) NOT DETECTED NOT DETECTED Final    Comment: Performed at [facility], [address] [place], [place] [address]  C difficile quick scan w PCR reflex     Status: None   Collection Time: 06/28/17  6:59 PM  Result Value Ref Range Status   C Diff antigen NEGATIVE NEGATIVE Final   C Diff toxin NEGATIVE NEGATIVE Final   C Diff interpretation No C. difficile detected.  Final  MRSA PCR Screening     Status: None   Collection Time: 06/28/17  8:00 PM  Result Value Ref Range Status   MRSA by PCR NEGATIVE NEGATIVE Final    Comment:        The GeneXpert MRSA Assay (FDA approved for NASAL specimens only), is one component of a comprehensive MRSA colonization surveillance program. It is not intended to diagnose MRSA infection nor to guide or monitor treatment for MRSA infections.          Radiology Studies: Dg Hips Bilat With Pelvis 3-4 Views  Result Date: 07/03/2017 CLINICAL DATA:  Pt c/o generalized bilat hip pain. Hx of SBO. Watery stools for past 3 days. No injury. Pt has very stiff hip joints and doesn't abduct left hip much at all. EXAM: DG HIP (WITH OR WITHOUT PELVIS) 3-4V BILAT COMPARISON:  None. FINDINGS: No fracture.  No bone lesion.  Bones are diffusely demineralized. Left total hip arthroplasty appears well seated and aligned. Right hip joint is normally spaced and aligned. No significant arthropathic changes. SI joints and symphysis pubis are normally spaced and aligned. There are dense calcifications along the iliac and femoral arteries. There is mild distention of the colon. IMPRESSION: 1. No fracture, bone lesion or acute finding. No significant right hip joint arthropathic change. Well-positioned  left hip total arthroplasty. 2. Mild colonic distention, incompletely imaged. Electronically Signed   By: Lajean Manes M.D.   On: 07/03/2017 13:53        Scheduled Meds: . amLODipine  2.5 mg Oral Daily  . baclofen  5 mg Oral QHS  . cyanocobalamin  500 mcg Oral QPM  .  dorzolamide  1 drop Both Eyes TID  . escitalopram  10 mg Oral Daily  . famotidine  20 mg Oral QHS  . fluticasone  2 spray Each Nare QHS  . fosfomycin  3 g Oral Q72H  . gabapentin  100 mg Oral QHS  . latanoprost  1 drop Both Eyes QHS  . loratadine  10 mg Oral QAC breakfast  . potassium chloride  40 mEq Oral Q4H   Continuous Infusions: . sodium chloride 10 mL/hr at 07/03/17 0902  . potassium chloride       LOS: 6 days    Time spent: over 30 min    Fayrene Helper, MD [facility] Pager [phone]  If 7PM-7AM, please contact night-coverage [email or web address] Password [email or web address] 07/04/2017, 11:52 AM

## 2017-07-05 ENCOUNTER — Inpatient Hospital Stay (HOSPITAL_COMMUNITY): Payer: Medicare Other

## 2017-07-05 ENCOUNTER — Encounter (HOSPITAL_COMMUNITY): Payer: Self-pay

## 2017-07-05 DIAGNOSIS — M7989 Other specified soft tissue disorders: Secondary | ICD-10-CM

## 2017-07-05 LAB — BASIC METABOLIC PANEL
Anion gap: 4 — ABNORMAL LOW (ref 5–15)
BUN: 9 mg/dL (ref 6–20)
CHLORIDE: 119 mmol/L — AB (ref 101–111)
CO2: 18 mmol/L — AB (ref 22–32)
CREATININE: 1.03 mg/dL — AB (ref 0.44–1.00)
Calcium: 8.6 mg/dL — ABNORMAL LOW (ref 8.9–10.3)
GFR calc non Af Amer: 46 mL/min — ABNORMAL LOW (ref >60)
GFR, EST AFRICAN AMERICAN: 54 mL/min — AB (ref >60)
Glucose, Bld: 116 mg/dL — ABNORMAL HIGH (ref 65–99)
Potassium: 3.2 mmol/L — ABNORMAL LOW (ref 3.5–5.1)
Sodium: 141 mmol/L (ref 135–145)

## 2017-07-05 LAB — CBC
HEMATOCRIT: 27.2 % — AB (ref 36.0–46.0)
HEMOGLOBIN: 9.3 g/dL — AB (ref 12.0–15.0)
MCH: 34.7 pg — ABNORMAL HIGH (ref 26.0–34.0)
MCHC: 34.2 g/dL (ref 30.0–36.0)
MCV: 101.5 fL — ABNORMAL HIGH (ref 78.0–100.0)
Platelets: 117 10*3/uL — ABNORMAL LOW (ref 150–400)
RBC: 2.68 MIL/uL — AB (ref 3.87–5.11)
RDW: 14.3 % (ref 11.5–15.5)
WBC: 4.8 10*3/uL (ref 4.0–10.5)

## 2017-07-05 LAB — MAGNESIUM: MAGNESIUM: 2 mg/dL (ref 1.7–2.4)

## 2017-07-05 MED ORDER — DICLOFENAC SODIUM 1 % TD GEL
2.0000 g | Freq: Four times a day (QID) | TRANSDERMAL | Status: DC
  Administered 2017-07-05 – 2017-07-08 (×12): 2 g via TOPICAL
  Filled 2017-07-05: qty 100

## 2017-07-05 MED ORDER — LIDOCAINE 5 % EX PTCH
1.0000 | MEDICATED_PATCH | CUTANEOUS | Status: DC
  Administered 2017-07-05 – 2017-07-08 (×4): 1 via TRANSDERMAL
  Filled 2017-07-05 (×4): qty 1

## 2017-07-05 MED ORDER — POLYETHYLENE GLYCOL 3350 17 G PO PACK: 17.0000 g | PACK | Freq: Every day | ORAL | Status: DC | PRN

## 2017-07-05 MED ORDER — POTASSIUM CHLORIDE CRYS ER 20 MEQ PO TBCR
40.0000 meq | EXTENDED_RELEASE_TABLET | ORAL | Status: AC
  Administered 2017-07-05 (×2): 40 meq via ORAL
  Filled 2017-07-05 (×2): qty 2

## 2017-07-05 MED ORDER — BISACODYL 10 MG RE SUPP
10.0000 mg | Freq: Every day | RECTAL | Status: DC | PRN
  Administered 2017-07-06: 10 mg via RECTAL
  Filled 2017-07-05: qty 1

## 2017-07-05 NOTE — Progress Notes (Signed)
PROGRESS NOTE    Maria Christian  JME:[id number] DOB: 05-Feb-1925 DOA: 06/28/2017 PCP: [name], DO   Brief Narrative:  female,with history of atrial fibrillation, not on chronic anticoagulation, take aspirin for anticoagulation, depression, chronic respiratory failure,chronic constipation with history of recurrent sigmoid volvulus,hypertension, CKD stage III, chronic diastolic heart failure who came to hospital with chief complaints of diarrhea.Initially patient was sent from the skilled facility for concern for SBO as x-ray of the abdomen showed findings consistent with small bowel obstruction. Patient complains of abdominal pain with watery diarrhea for past three days. Also noted intermittent bright red blood in the stool. She has had poor PO intake. Denies any vomiting. Complains of dysuria  Assessment & Plan:   Active Problems:   Volvulus of sigmoid colon (HCC)   Diarrhea   UTI (urinary tract infection)   1. UTI-patient has abnormal UA,she has history of ESBL E. coli. Started on Zosyn per pharmacy. Urine culture showed mixed species.  Pt with history of ESBL and started on fosfomycin (1/26, 1/29, and 1 additional dose tomorrow). Zosyn has been discontinued and patient started on Fosfomycin. Patient will get total three doses this antibiotic Q 72 hours  2. Neck pain-  Continue Flexeril 5 mg PO TID as needed.  Voltaren, lidocaine.  3. RLE swelling- repeat LE [name] to rule out DVT  4. Left hip pain-complains of left hip pain and LLE pain.  X rays without fracture.  Well positioned L hip arthroplasty.  Will continue to monitor.  Lidocaine patch and voltaren gel.  Son notes this LLE pain is chronic.   5. Diarrhea- improving, started on Imodium PRN (nursing notes stool recently formed, will continue to monitor given distension below).  Negative C diff.  CT of the abdomen and pelvis demonstrates no volvulus and no significant change from prior exam.  Continue to monitor.   6. Atrial  fibrillation- Has had some burst of RVR with rates into the 120's, generally short lasting.  Beta blocker was d/c'd given below.  Will continue to monitor given short duration.  takes aspirin for anticoagulation. Will hold aspirin this time due to recent history of blood in the stool.  7. Bradycardia- improved after stopping Coreg, patient heart rate dropped to 30s while sleeping, she has been asymptomatic. HR currently improved.    8. Acute kidney injury on CKD stage III-patient's creatinine peaked to 2.3 on admission.  Baseline creatinine is 1.4-1.5. Creatinine continuing to improve.  Continue to monitor.   9. Chronic diastolic CHF-patient is currentlyeuvolemic, torsemide is on hold.   10. Hypokalemia- follow magnesium.  Replete 40 BID PO today.   11. History of recurrent sigmoid volvulus  Abdominal distention-CT abdomen shows no significant change from previous.  Repeat KUB today for persistent distention (seemed Karthika Glasper bit more today), findings consistent.  Sounds like she's still having BM's (formed per nursing), will keep close eye going forward.    12. Depression-continue Lexapro  13. Hypertension-continue Coreg, Norvasc  14. NAGMA: chloride 118, bicarb 17-18 consistently over past few days.  Continue to monitor for now  DVT prophylaxis: SCD Code Status: DNR Family Communication: none at bedside Disposition Plan: pending  Consultants:   none  Procedures:   none  Antimicrobials:  Anti-infectives (From admission, onward)   Start     Dose/Rate Route Frequency Ordered Stop   06/28/17 2330  piperacillin-tazobactam (ZOSYN) IVPB 2.25 g  Status:  Discontinued     2.25 g 100 mL/hr over 30 Minutes Intravenous Every 8 hours 06/28/17 1736  06/30/17 0901   06/28/17 1515  piperacillin-tazobactam (ZOSYN) IVPB 3.375 g     3.375 g 100 mL/hr over 30 Minutes Intravenous  Once 06/28/17 1504 06/28/17 1718    Fosfomycin 1/26, 1/29 Subjective: C/o some LLE pain. Some neck  discomfort Tayli Buch&Ox2-3 (said last day of February)  Objective: Vitals:   07/04/17 1300 07/04/17 2027 07/05/17 0835 07/05/17 1442  BP: 122/69 (!) 142/73 (!) 149/77 137/70  Pulse: 84 84 85 84  Resp: 18 20 (!) 24   Temp: 98.2 F (36.8 C) 98.9 F (37.2 C) 99.2 F (37.3 C) 97.8 F (36.6 C)  TempSrc: Oral Oral Oral Oral  SpO2: 97% 96% 95% 96%  Weight:      Height:        Intake/Output Summary (Last 24 hours) at 07/05/2017 1454 Last data filed at 07/05/2017 1012 Gross per 24 hour  Intake 574.67 ml  Output -  Net 574.67 ml   Filed Weights   06/28/17 1957  Weight: 69 kg (152 lb 3.2 oz)    Examination:  General exam: Appears calm and comfortable  Respiratory system: Clear to auscultation. Respiratory effort normal. Cardiovascular system: S1 & S2 heard, RRR. No JVD, murmurs, rubs, gallops or clicks.  Gastrointestinal system: Abdomen is distended, soft and nontender. No organomegaly or masses felt.  Central nervous system: Alert and oriented. No focal neurological deficits appreciated. Extremities: RLE edema, some calf tenderness.  Some mild TTP to LLE, diffuse, nonspecific.  Good strength to bilateral LE 5/5.   Skin: No rashes, lesions or ulcers Psychiatry: Judgement and insight appear normal. Mood & affect appropriate.     Data Reviewed: I have personally reviewed following labs and imaging studies  CBC: Recent Labs  Lab 06/29/17 0425 06/30/17 0503 07/05/17 0501  WBC 3.4* 3.1* 4.8  HGB 9.7* 9.2* 9.3*  HCT 29.9* 27.5* 27.2*  MCV 105.7* 104.6* 101.5*  PLT 85* 74* 300*   Basic Metabolic Panel: Recent Labs  Lab 07/01/17 0412 07/01/17 0900 07/02/17 0433 07/03/17 0459 07/04/17 0510 07/05/17 0501  NA 141  --  139 139 140 141  K 3.0*  --  2.7* 2.8* 2.9* 3.2*  CL 120*  --  115* 116* 118* 119*  CO2 17*  --  18* 18* 17* 18*  GLUCOSE 98  --  138* 131* 106* 116*  BUN 14  --  8 8 8 9   CREATININE 1.28*  --  1.01* 0.97 1.01* 1.03*  CALCIUM 8.0*  --  8.4* 8.5* 8.3* 8.6*  MG   --  1.8  --   --  1.9 2.0   GFR: Estimated Creatinine Clearance: 35.3 mL/min (Tyannah Sane) (by C-G formula based on SCr of 1.03 mg/dL (H)). Liver Function Tests: Recent Labs  Lab 06/29/17 0425  AST 22  ALT 13*  ALKPHOS 79  BILITOT 0.4  PROT 6.6  ALBUMIN 3.2*   No results for input(s): LIPASE, AMYLASE in the last 168 hours. No results for input(s): AMMONIA in the last 168 hours. Coagulation Profile: No results for input(s): INR, PROTIME in the last 168 hours. Cardiac Enzymes: No results for input(s): CKTOTAL, CKMB, CKMBINDEX, TROPONINI in the last 168 hours. BNP (last 3 results) No results for input(s): PROBNP in the last 8760 hours. HbA1C: No results for input(s): HGBA1C in the last 72 hours. CBG: No results for input(s): GLUCAP in the last 168 hours. Lipid Profile: No results for input(s): CHOL, HDL, LDLCALC, TRIG, CHOLHDL, LDLDIRECT in the last 72 hours. Thyroid Function Tests: No results for input(s): TSH,  T4TOTAL, FREET4, T3FREE, THYROIDAB in the last 72 hours. Anemia Panel: No results for input(s): VITAMINB12, FOLATE, FERRITIN, TIBC, IRON, RETICCTPCT in the last 72 hours. Sepsis Labs: No results for input(s): PROCALCITON, LATICACIDVEN in the last 168 hours.  Recent Results (from the past 240 hour(s))  Urine culture     Status: Abnormal   Collection Time: 06/28/17 12:13 PM  Result Value Ref Range Status   Specimen Description   Final    URINE, RANDOM Performed at [facility], [address], [place], [place] [address]    Special Requests NONE  Final   Culture MULTIPLE SPECIES PRESENT, SUGGEST RECOLLECTION (Andriel Omalley)  Final   Report Status 06/29/2017 FINAL  Final  Gastrointestinal Panel by PCR , Stool     Status: None   Collection Time: 06/28/17  6:59 PM  Result Value Ref Range Status   Campylobacter species NOT DETECTED NOT DETECTED Final   Plesimonas shigelloides NOT DETECTED NOT DETECTED Final   Salmonella species NOT DETECTED NOT DETECTED Final   Yersinia  enterocolitica NOT DETECTED NOT DETECTED Final   Vibrio species NOT DETECTED NOT DETECTED Final   Vibrio cholerae NOT DETECTED NOT DETECTED Final   Enteroaggregative E coli (EAEC) NOT DETECTED NOT DETECTED Final   Enteropathogenic E coli (EPEC) NOT DETECTED NOT DETECTED Final   Enterotoxigenic E coli (ETEC) NOT DETECTED NOT DETECTED Final   Shiga like toxin producing E coli (STEC) NOT DETECTED NOT DETECTED Final   Shigella/Enteroinvasive E coli (EIEC) NOT DETECTED NOT DETECTED Final   Cryptosporidium NOT DETECTED NOT DETECTED Final   Cyclospora cayetanensis NOT DETECTED NOT DETECTED Final   Entamoeba histolytica NOT DETECTED NOT DETECTED Final   Giardia lamblia NOT DETECTED NOT DETECTED Final   Adenovirus F40/41 NOT DETECTED NOT DETECTED Final   Astrovirus NOT DETECTED NOT DETECTED Final   Norovirus GI/GII NOT DETECTED NOT DETECTED Final   Rotavirus Nixon Kolton NOT DETECTED NOT DETECTED Final   Sapovirus (I, II, IV, and V) NOT DETECTED NOT DETECTED Final    Comment: Performed at [facility], [address] [place], [place] [address]  C difficile quick scan w PCR reflex     Status: None   Collection Time: 06/28/17  6:59 PM  Result Value Ref Range Status   C Diff antigen NEGATIVE NEGATIVE Final   C Diff toxin NEGATIVE NEGATIVE Final   C Diff interpretation No C. difficile detected.  Final  MRSA PCR Screening     Status: None   Collection Time: 06/28/17  8:00 PM  Result Value Ref Range Status   MRSA by PCR NEGATIVE NEGATIVE Final    Comment:        The GeneXpert MRSA Assay (FDA approved for NASAL specimens only), is one component of Kamy Poinsett comprehensive MRSA colonization surveillance program. It is not intended to diagnose MRSA infection nor to guide or monitor treatment for MRSA infections.          Radiology Studies: Dg Abd 1 View  Result Date: 07/05/2017 CLINICAL DATA:  Followup distended abdomen. EXAM: ABDOMEN - 1 VIEW COMPARISON:  06/28/2017 FINDINGS: Continued gaseous  distension of the small and large bowel, colon predominant. Findings could be due to ileus or pseudo obstruction of the elderly. Similar pattern is been seen on prior imaging for the last several years. IMPRESSION: Most likely diagnosis is pseudo obstruction of the elderly. Persistent findings. Can't rule out actual ileus. Electronically Signed   By: Nelson Chimes M.D.   On: 07/05/2017 14:26  Scheduled Meds: . amLODipine  2.5 mg Oral Daily  . baclofen  5 mg Oral QHS  . cyanocobalamin  500 mcg Oral QPM  . diclofenac sodium  2 g Topical QID  . dorzolamide  1 drop Both Eyes TID  . escitalopram  10 mg Oral Daily  . famotidine  20 mg Oral QHS  . fluticasone  2 spray Each Nare QHS  . fosfomycin  3 g Oral Q72H  . gabapentin  100 mg Oral QHS  . latanoprost  1 drop Both Eyes QHS  . lidocaine  1 patch Transdermal Q24H  . loratadine  10 mg Oral QAC breakfast  . potassium chloride  40 mEq Oral Q4H   Continuous Infusions: . sodium chloride 10 mL/hr at 07/03/17 0902     LOS: 7 days    Time spent: over 30 min    Fayrene Helper, MD [facility] Pager [phone]  If 7PM-7AM, please contact night-coverage [email or web address] Password [email or web address] 07/05/2017, 2:54 PM

## 2017-07-05 NOTE — Progress Notes (Signed)
*  PRELIMINARY RESULTS* Vascular Ultrasound Right lower extremity venous duplex has been completed.  Preliminary findings: No evidence of deep vein thrombosis in the visualized veins of the right lower extremity.  Negative for baker's cyst on the right.   Everrett Coombe 07/05/2017, 4:08 PM

## 2017-07-06 LAB — BASIC METABOLIC PANEL
ANION GAP: 5 (ref 5–15)
BUN: 8 mg/dL (ref 6–20)
CALCIUM: 8.7 mg/dL — AB (ref 8.9–10.3)
CO2: 18 mmol/L — AB (ref 22–32)
CREATININE: 0.98 mg/dL (ref 0.44–1.00)
Chloride: 119 mmol/L — ABNORMAL HIGH (ref 101–111)
GFR, EST AFRICAN AMERICAN: 57 mL/min — AB (ref >60)
GFR, EST NON AFRICAN AMERICAN: 49 mL/min — AB (ref >60)
Glucose, Bld: 118 mg/dL — ABNORMAL HIGH (ref 65–99)
Potassium: 3.5 mmol/L (ref 3.5–5.1)
SODIUM: 142 mmol/L (ref 135–145)

## 2017-07-06 LAB — CBC
HEMATOCRIT: 27.8 % — AB (ref 36.0–46.0)
Hemoglobin: 9.4 g/dL — ABNORMAL LOW (ref 12.0–15.0)
MCH: 34.4 pg — ABNORMAL HIGH (ref 26.0–34.0)
MCHC: 33.8 g/dL (ref 30.0–36.0)
MCV: 101.8 fL — ABNORMAL HIGH (ref 78.0–100.0)
PLATELETS: 136 10*3/uL — AB (ref 150–400)
RBC: 2.73 MIL/uL — ABNORMAL LOW (ref 3.87–5.11)
RDW: 14.5 % (ref 11.5–15.5)
WBC: 4.4 10*3/uL (ref 4.0–10.5)

## 2017-07-06 LAB — MAGNESIUM: MAGNESIUM: 2 mg/dL (ref 1.7–2.4)

## 2017-07-06 MED ORDER — POLYETHYLENE GLYCOL 3350 17 G PO PACK
17.0000 g | PACK | Freq: Two times a day (BID) | ORAL | Status: DC
  Administered 2017-07-06 – 2017-07-08 (×5): 17 g via ORAL
  Filled 2017-07-06 (×4): qty 1

## 2017-07-06 MED ORDER — ASPIRIN-ACETAMINOPHEN-CAFFEINE 250-250-65 MG PO TABS
1.0000 | ORAL_TABLET | Freq: Every day | ORAL | Status: DC
  Administered 2017-07-07 – 2017-07-08 (×2): 1 via ORAL
  Filled 2017-07-06 (×4): qty 1

## 2017-07-06 MED ORDER — ONDANSETRON HCL 4 MG/2ML IJ SOLN
4.0000 mg | Freq: Three times a day (TID) | INTRAMUSCULAR | Status: DC
  Administered 2017-07-08: 4 mg via INTRAVENOUS
  Filled 2017-07-06: qty 2

## 2017-07-06 MED ORDER — POTASSIUM CHLORIDE CRYS ER 20 MEQ PO TBCR
40.0000 meq | EXTENDED_RELEASE_TABLET | Freq: Once | ORAL | Status: AC
  Administered 2017-07-06: 40 meq via ORAL
  Filled 2017-07-06: qty 2

## 2017-07-06 MED ORDER — ONDANSETRON HCL 4 MG PO TABS
4.0000 mg | ORAL_TABLET | Freq: Three times a day (TID) | ORAL | Status: DC
Start: 2017-07-06 — End: 2017-07-08
  Administered 2017-07-06 – 2017-07-08 (×5): 4 mg via ORAL
  Filled 2017-07-06 (×5): qty 1

## 2017-07-06 NOTE — Progress Notes (Signed)
PROGRESS NOTE    Maria Christian  BOF:[id number] DOB: [birth year] DOA: 06/28/2017 PCP: [name], DO   Brief Narrative:  female,with history of atrial fibrillation, not on chronic anticoagulation, take aspirin for anticoagulation, depression, chronic respiratory failure,chronic constipation with history of recurrent sigmoid volvulus,hypertension, CKD stage III, chronic diastolic heart failure who came to hospital with chief complaints of diarrhea.Initially patient was sent from the skilled facility for concern for SBO as x-ray of the abdomen showed findings consistent with small bowel obstruction. Patient complains of abdominal pain with watery diarrhea for past three days. Also noted intermittent bright red blood in the stool. She has had poor PO intake. Denies any vomiting. Complains of dysuria  Assessment & Plan:   Active Problems:   Volvulus of sigmoid colon (HCC)   Diarrhea   UTI (urinary tract infection)   1. UTI-patient has abnormal UA,she has history of ESBL E. coli. Started on Zosyn per pharmacy. Urine culture showed mixed species.  Pt with history of ESBL and started on fosfomycin (1/26, 1/29, and 2/1). Zosyn has been discontinued and patient started on Fosfomycin which is now complete.  2. Neck pain-  Continue Flexeril 5 mg PO TID as needed.  Voltaren, lidocaine.  3. RLE swelling- [name] negative for DVT.  4. Left hip pain-complains of left hip pain and LLE pain.  X rays without fracture.  Well positioned L hip arthroplasty.  Will continue to monitor.  Lidocaine patch and voltaren gel.  Son notes this LLE pain is chronic.   5. History of recurrent sigmoid volvulus  Abdominal distention-CT abdomen shows no significant change from previous.  Repeat KUB 1/31 for persistent distention (seemed Madigan Rosensteel bit more over past few days), findings consistent.  No BM since 1/30.  Will give suppository, start bowel regimen.  Sounds like she has some mucoid stool, but no normal BM.  Monitor  closely, given her history, would like her to have normal bowel movement prior to discharge.   6. Diarrhea- improved.  Imodium discontinued.  Bowel regimen started given distension.  Negative C diff.  CT of the abdomen and pelvis demonstrates no volvulus and no significant change from prior exam.  Continue to monitor.   7. Atrial fibrillation- Has had some burst of RVR with rates into the 120's, generally short lasting.  Beta blocker was d/c'd given below.  Will continue to monitor given short duration.  takes aspirin for anticoagulation. Will hold aspirin this time due to recent history of blood in the stool.  8. Bradycardia- improved after stopping Coreg, patient heart rate dropped to 30s while sleeping, she has been asymptomatic. HR currently improved.    9. Acute kidney injury on CKD stage III-patient's creatinine peaked to 2.3 on admission.  Baseline creatinine is 1.4-1.5. Creatinine continuing to improve.  Continue to monitor.   10. Chronic diastolic CHF-patient is currentlyeuvolemic, torsemide is on hold.   11. Hypokalemia- follow magnesium.  Improved, give 40 PO today.   12. Depression-continue Lexapro  13. Hypertension-continue Coreg, Norvasc  14. NAGMA: chloride 118, bicarb 17-18 consistently over past few days.  Continue to monitor for now  DVT prophylaxis: SCD Code Status: DNR Family Communication: none at bedside Disposition Plan: pending  Consultants:   none  Procedures:   none  Antimicrobials:  Anti-infectives (From admission, onward)   Start     Dose/Rate Route Frequency Ordered Stop   06/28/17 2330  piperacillin-tazobactam (ZOSYN) IVPB 2.25 g  Status:  Discontinued     2.25 g 100 mL/hr over 30  Minutes Intravenous Every 8 hours 06/28/17 1736 06/30/17 0901   06/28/17 1515  piperacillin-tazobactam (ZOSYN) IVPB 3.375 g     3.375 g 100 mL/hr over 30 Minutes Intravenous  Once 06/28/17 1504 06/28/17 1718    Fosfomycin 1/26, 1/29 Subjective: Doing better.     Mickenzie Stolar&Ox1 today (didn't know month, location with some prompting).  Objective: Vitals:   07/05/17 1442 07/05/17 2109 07/06/17 0641 07/06/17 0854  BP: 137/70 132/66 137/75 129/62  Pulse: 84 73 87 77  Resp:  20 20 20   Temp: 97.8 F (36.6 C) 98.6 F (37 C) 98.1 F (36.7 C)   TempSrc: Oral Oral Oral   SpO2: 96% 97% 95% 94%  Weight:      Height:        Intake/Output Summary (Last 24 hours) at 07/06/2017 1051 Last data filed at 07/06/2017 0600 Gross per 24 hour  Intake 265.33 ml  Output -  Net 265.33 ml   Filed Weights   06/28/17 1957  Weight: 69 kg (152 lb 3.2 oz)    Examination:  General: No acute distress. Cardiovascular: Heart sounds show Janeva Peaster regular rate, and rhythm. No gallops or rubs. No murmurs. No JVD. Lungs: Clear to auscultation bilaterally with good air movement. No rales, rhonchi or wheezes. Abdomen: Soft, nontender, nondistended with normal active bowel sounds. No masses. No hepatosplenomegaly. Neurological: Alert and oriented 1. Moves all extremities 4. Cranial nerves II through XII grossly intact. Skin: Warm and dry. No rashes or lesions. Extremities: No clubbing or cyanosis.R>L LEE.  Psychiatric: Mood and affect are normal. Insight and judgment are impared.    Data Reviewed: I have personally reviewed following labs and imaging studies  CBC: Recent Labs  Lab 06/30/17 0503 07/05/17 0501 07/06/17 0420  WBC 3.1* 4.8 4.4  HGB 9.2* 9.3* 9.4*  HCT 27.5* 27.2* 27.8*  MCV 104.6* 101.5* 101.8*  PLT 74* 117* 650*   Basic Metabolic Panel: Recent Labs  Lab 07/01/17 0900 07/02/17 0433 07/03/17 0459 07/04/17 0510 07/05/17 0501 07/06/17 0420  NA  --  139 139 140 141 142  K  --  2.7* 2.8* 2.9* 3.2* 3.5  CL  --  115* 116* 118* 119* 119*  CO2  --  18* 18* 17* 18* 18*  GLUCOSE  --  138* 131* 106* 116* 118*  BUN  --  8 8 8 9 8   CREATININE  --  1.01* 0.97 1.01* 1.03* 0.98  CALCIUM  --  8.4* 8.5* 8.3* 8.6* 8.7*  MG 1.8  --   --  1.9 2.0 2.0   GFR: Estimated  Creatinine Clearance: 37.1 mL/min (by C-G formula based on SCr of 0.98 mg/dL). Liver Function Tests: No results for input(s): AST, ALT, ALKPHOS, BILITOT, PROT, ALBUMIN in the last 168 hours. No results for input(s): LIPASE, AMYLASE in the last 168 hours. No results for input(s): AMMONIA in the last 168 hours. Coagulation Profile: No results for input(s): INR, PROTIME in the last 168 hours. Cardiac Enzymes: No results for input(s): CKTOTAL, CKMB, CKMBINDEX, TROPONINI in the last 168 hours. BNP (last 3 results) No results for input(s): PROBNP in the last 8760 hours. HbA1C: No results for input(s): HGBA1C in the last 72 hours. CBG: No results for input(s): GLUCAP in the last 168 hours. Lipid Profile: No results for input(s): CHOL, HDL, LDLCALC, TRIG, CHOLHDL, LDLDIRECT in the last 72 hours. Thyroid Function Tests: No results for input(s): TSH, T4TOTAL, FREET4, T3FREE, THYROIDAB in the last 72 hours. Anemia Panel: No results for input(s): VITAMINB12, FOLATE, FERRITIN,  TIBC, IRON, RETICCTPCT in the last 72 hours. Sepsis Labs: No results for input(s): PROCALCITON, LATICACIDVEN in the last 168 hours.  Recent Results (from the past 240 hour(s))  Urine culture     Status: Abnormal   Collection Time: 06/28/17 12:13 PM  Result Value Ref Range Status   Specimen Description   Final    URINE, RANDOM Performed at [facility], [address], [place], [place] [address]    Special Requests NONE  Final   Culture MULTIPLE SPECIES PRESENT, SUGGEST RECOLLECTION (Lyanne Kates)  Final   Report Status 06/29/2017 FINAL  Final  Gastrointestinal Panel by PCR , Stool     Status: None   Collection Time: 06/28/17  6:59 PM  Result Value Ref Range Status   Campylobacter species NOT DETECTED NOT DETECTED Final   Plesimonas shigelloides NOT DETECTED NOT DETECTED Final   Salmonella species NOT DETECTED NOT DETECTED Final   Yersinia enterocolitica NOT DETECTED NOT DETECTED Final   Vibrio species NOT DETECTED NOT  DETECTED Final   Vibrio cholerae NOT DETECTED NOT DETECTED Final   Enteroaggregative E coli (EAEC) NOT DETECTED NOT DETECTED Final   Enteropathogenic E coli (EPEC) NOT DETECTED NOT DETECTED Final   Enterotoxigenic E coli (ETEC) NOT DETECTED NOT DETECTED Final   Shiga like toxin producing E coli (STEC) NOT DETECTED NOT DETECTED Final   Shigella/Enteroinvasive E coli (EIEC) NOT DETECTED NOT DETECTED Final   Cryptosporidium NOT DETECTED NOT DETECTED Final   Cyclospora cayetanensis NOT DETECTED NOT DETECTED Final   Entamoeba histolytica NOT DETECTED NOT DETECTED Final   Giardia lamblia NOT DETECTED NOT DETECTED Final   Adenovirus F40/41 NOT DETECTED NOT DETECTED Final   Astrovirus NOT DETECTED NOT DETECTED Final   Norovirus GI/GII NOT DETECTED NOT DETECTED Final   Rotavirus Tramain Gershman NOT DETECTED NOT DETECTED Final   Sapovirus (I, II, IV, and V) NOT DETECTED NOT DETECTED Final    Comment: Performed at [facility], [address] [place], [place] [address]  C difficile quick scan w PCR reflex     Status: None   Collection Time: 06/28/17  6:59 PM  Result Value Ref Range Status   C Diff antigen NEGATIVE NEGATIVE Final   C Diff toxin NEGATIVE NEGATIVE Final   C Diff interpretation No C. difficile detected.  Final  MRSA PCR Screening     Status: None   Collection Time: 06/28/17  8:00 PM  Result Value Ref Range Status   MRSA by PCR NEGATIVE NEGATIVE Final    Comment:        The GeneXpert MRSA Assay (FDA approved for NASAL specimens only), is one component of Kendyl Festa comprehensive MRSA colonization surveillance program. It is not intended to diagnose MRSA infection nor to guide or monitor treatment for MRSA infections.          Radiology Studies: Dg Abd 1 View  Result Date: 07/05/2017 CLINICAL DATA:  Followup distended abdomen. EXAM: ABDOMEN - 1 VIEW COMPARISON:  06/28/2017 FINDINGS: Continued gaseous distension of the small and large bowel, colon predominant. Findings could be due  to ileus or pseudo obstruction of the elderly. Similar pattern is been seen on prior imaging for the last several years. IMPRESSION: Most likely diagnosis is pseudo obstruction of the elderly. Persistent findings. Can't rule out actual ileus. Electronically Signed   By: Nelson Chimes M.D.   On: 07/05/2017 14:26        Scheduled Meds: . amLODipine  2.5 mg Oral Daily  . baclofen  5  mg Oral QHS  . cyanocobalamin  500 mcg Oral QPM  . diclofenac sodium  2 g Topical QID  . dorzolamide  1 drop Both Eyes TID  . escitalopram  10 mg Oral Daily  . famotidine  20 mg Oral QHS  . fluticasone  2 spray Each Nare QHS  . gabapentin  100 mg Oral QHS  . latanoprost  1 drop Both Eyes QHS  . lidocaine  1 patch Transdermal Q24H  . loratadine  10 mg Oral QAC breakfast   Continuous Infusions: . sodium chloride 10 mL/hr at 07/03/17 0902     LOS: 8 days    Time spent: over 35 min    Fayrene Helper, MD [facility] Pager [phone]  If 7PM-7AM, please contact night-coverage [email or web address] Password [facility] 07/06/2017, 10:51 AM

## 2017-07-06 NOTE — Consult Note (Signed)
Consultation  Referring Provider: [facility] Primary Care Physician:  [name], DO Primary Gastroenterologist:  Dr.Pyrtle  Reason for Consultation:  Ileus   HPI: Maria Christian is a 84 y.o. female, known to Dr. Hilarie Fredrickson who has history of a recurrent sigmoid volvulus.  She was last hospitalized for this and underwent decompression in November 2017. Patient is a nursing home resident with history of dementia, chronic kidney disease, congestive heart failure, chronic respiratory failure, atrial fibrillation and chronic constipation.  She was admitted on 06/28/2017 from the nursing home with 3-day history of voluminous diarrhea.  Her daughter says that generally she is on several laxatives at the nursing home and when she does have a bowel movement she will have a "blowout" with a large amount of stool which is usually loose.  He says for the 2 days prior to coming to the hospital she had horrible diarrhea she said that was all over the bed.  She did not have any nausea vomiting, no documented fever or chills but was not eating well and was complaining of abdominal pain.  She had been on recent antibiotic in form of Keflex for urinary tract infection, and has been on antibiotics here because of a resistant ESBL E. coli. On admission CT of the abdomen and pelvis was done which showed marked cardiomegaly and marked gaseous distention of redundant sigmoid with an air-fluid level present but no volvulus was identified and there was no evidence of small bowel obstruction.  She underwent infectious workup with GI pathogen, and C. difficile quick scan which was negative. Repeat abdominal films on 07/05/2017 showed more of a diffuse ileus with gaseous distention of the small and large bowel: Predominant question pseudoobstruction. Patient says she feels nauseated but has not been vomiting, her daughter says she really has not been wanting to eat.  Patient has no complaints of abdominal pain  but says her abdomen feels a bit distended.  Her diarrhea completely stopped a couple of days ago, she had been receiving Imodium.  She is only passing small amounts of mucoid material currently Her usual regimen at the nursing home appears to be Linzess 145 mcg daily MiraLAX 17 g daily Senokot twice daily. Review of labs show that she has been hypokalemic, this is been replaced, she also has a chronic anemia, macrocytic.   Past Medical History:  Diagnosis Date  . Altered mental status   . Anemia   . Atrial fibrillation ([place])   . CKD (chronic kidney disease) stage 3, GFR 30-59 ml/min (HCC) 02/27/2015  . Constipation 02/27/2015  . Coronary artery disease   . Dementia   . Depression   . Diabetes mellitus   . Edema of lower extremity 07/13/11   right leg more swollen than left leg  . Encephalopathy   . Enlarged heart   . Esophageal dysmotility 07/02/12  . GERD (gastroesophageal reflux disease)   . History of adenomatous polyp of colon 06/24/99  . Horseshoe kidney   . Hyperlipemia   . Hypertension   . Pancreatic lesion 05/22/11   no further workup  per PCP/family due to age  . Parkinson disease ([place])   . Peripheral neuropathy   . Sigmoid volvulus ([place]) 05/24/2013  . Thrombophlebitis   . TIA (transient ischemic attack) 12/19/2012    Past Surgical History:  Procedure Laterality Date  . ABDOMINAL HYSTERECTOMY    . BOWEL DECOMPRESSION N/A 04/18/2016   Procedure: BOWEL DECOMPRESSION;  Surgeon: [name], MD;  Location: [facility];  Service: Endoscopy;  Laterality: N/A;  . BREAST SURGERY     2 benign tumors removed left breast  . CHOLECYSTECTOMY    . ESOPHAGEAL DILATION     several times by Dr. [name]  . ESOPHAGOGASTRODUODENOSCOPY (EGD) WITH ESOPHAGEAL DILATION N/A 08/01/2012   Procedure: ESOPHAGOGASTRODUODENOSCOPY (EGD) WITH ESOPHAGEAL DILATION;  Surgeon: [name], MD;  Location: [facility];  Service: Endoscopy;  Laterality: N/A;  with c-arm savory dilators  .  ESOPHAGOGASTRODUODENOSCOPY (EGD) WITH PROPOFOL N/A 03/12/2015   Procedure: ESOPHAGOGASTRODUODENOSCOPY (EGD) WITH PROPOFOL;  Surgeon: [name], MD;  Location: [facility];  Service: Endoscopy;  Laterality: N/A;  . FLEXIBLE SIGMOIDOSCOPY N/A 05/24/2013   Procedure: FLEXIBLE SIGMOIDOSCOPY;  Surgeon: [name], MD;  Location: [facility];  Service: Endoscopy;  Laterality: N/A;  . FLEXIBLE SIGMOIDOSCOPY N/A 05/26/2013   Procedure:  flex with decompression of sigmoid volvulus;  Surgeon: [name], MD;  Location: [facility];  Service: Endoscopy;  Laterality: N/A;  . FLEXIBLE SIGMOIDOSCOPY N/A 12/20/2015   Procedure: FLEXIBLE SIGMOIDOSCOPY;  Surgeon: Milus Banister, MD;  Location: [facility];  Service: Endoscopy;  Laterality: N/A;  . FLEXIBLE SIGMOIDOSCOPY N/A 04/17/2016   Procedure: FLEXIBLE SIGMOIDOSCOPY;  Surgeon: Ladene Artist, MD;  Location: [facility];  Service: Endoscopy;  Laterality: N/A;  . FLEXIBLE SIGMOIDOSCOPY N/A 04/18/2016   Procedure: FLEXIBLE SIGMOIDOSCOPY;  Surgeon: [name], MD;  Location: [facility];  Service: Endoscopy;  Laterality: N/A;  . FOOT SURGERY     benign tumors from foot  . NOSE SURGERY    . SHOULDER SURGERY  2001   left clavicle excision and acromioplasty  . TOTAL HIP ARTHROPLASTY      Prior to Admission medications   Medication Sig Start Date End Date Taking? Authorizing Provider  acetaminophen (TYLENOL) 325 MG tablet Take 650 mg by mouth daily as needed (OSTEOARTHRITIS).   Yes [provider]  amLODipine (NORVASC) 2.5 MG tablet Take 2.5 mg by mouth every morning. Hold for BP < 100/60 or HR < 60   Yes [provider]  aspirin EC 81 MG tablet Take 81 mg by mouth daily.   Yes [provider]  aspirin-acetaminophen-caffeine (EXCEDRIN MIGRAINE) [id number] MG tablet Take 1 tablet by mouth daily.   Yes [provider]  baclofen (LIORESAL) 10 MG tablet Take 5 mg by mouth at bedtime.   Yes [provider]  calcium carbonate (TUMS - DOSED IN MG ELEMENTAL CALCIUM) 500 MG chewable tablet Chew 2 tablets by mouth 3 (three) times daily.   Yes [provider]  carvedilol (COREG) 3.125 MG tablet Take 3.125 mg by mouth daily.   Yes [provider]  Cranberry 475 MG CAPS Take 1 capsule by mouth 2 (two) times daily.    Yes [provider]  diclofenac sodium (VOLTAREN) 1 % GEL Apply 2 grams to neck topically three times a day for pain 05/15/17  Yes [provider]  docusate sodium (COLACE) 100 MG capsule Take 200 mg by mouth at bedtime.    Yes [provider]  dorzolamide (TRUSOPT) 2 % ophthalmic solution Place 1 drop into both eyes 3 (three) times daily.   Yes [provider]  escitalopram (LEXAPRO) 10 MG tablet Take 10 mg by mouth daily.   Yes [provider]  famotidine (PEPCID) 20 MG tablet Take 20 mg by mouth at bedtime. 04/05/17  Yes [provider]  ferrous sulfate 220 (44 Fe) MG/5ML solution Give 5 ml by mouth in the evening  for supplement   Yes [provider]  fluticasone (FLONASE) 50 MCG/ACT nasal spray Place 2 sprays into both nostrils at bedtime.   Yes [provider]  gabapentin (NEURONTIN) 100 MG capsule Take 100 mg by mouth at bedtime.   Yes [provider]  Linaclotide (LINZESS) 145 MCG CAPS capsule Take 145 mcg by mouth daily before breakfast.    Yes [provider]  loratadine (CLARITIN) 10 MG tablet Take 10 mg by mouth daily before breakfast.    Yes [provider]  Multiple Vitamins-Minerals (CERTAGEN PO) Take 1 tablet by mouth every evening.    Yes [provider]  Multiple Vitamins-Minerals (HAIR SKIN AND NAILS FORMULA) TABS Take by mouth. Give 2 tablets by mouth daily   Yes [provider]  nystatin cream (MYCOSTATIN) Apply to buttocks topically two times a day for redness   Yes [provider]  omeprazole (PRILOSEC) 20 MG capsule  Take 20 mg by mouth daily.   Yes [provider]  Phenazopyridine HCl (AZO TABS PO) Take 1 tablet by mouth at bedtime.   Yes [provider]  polyethylene glycol (MIRALAX / GLYCOLAX) packet Take 17 g by mouth daily.    Yes [provider]  potassium chloride SA (K-DUR,KLOR-CON) 20 MEQ tablet Take 20-40 mEq by mouth 2 (two) times daily. Give 40 meq (2 tablets) in the morning and Give 20 meq (1 tablet) by mouth in the afternoon. 05/06/17  Yes [provider]  senna (SENOKOT) 8.6 MG TABS tablet Take 1 tablet (8.6 mg total) by mouth 2 (two) times daily. 01/05/14  Yes Barton Dubois, MD  torsemide (DEMADEX) 20 MG tablet Take 20 mg by mouth daily.   Yes [provider]  Travoprost, BAK Free, (TRAVATAN) 0.004 % SOLN ophthalmic solution Place 1 drop into both eyes daily at 8 pm.    Yes [provider]  vitamin B-12 (CYANOCOBALAMIN) 500 MCG tablet Take 500 mcg by mouth every evening.    Yes [provider]  benzocaine-menthol (CHLORASEPTIC) 6-10 MG lozenge Take 1 lozenge by mouth as needed for sore throat.    [provider]  cephALEXin (KEFLEX) 500 MG capsule Take 500 mg by mouth 2 (two) times daily. FOR UTI START DAY 06/18/17 - END DATE 06/24/17    [provider]  ondansetron (ZOFRAN) 8 MG tablet Take 8 mg by mouth every 8 (eight) hours as needed for nausea or vomiting.    [provider]  sodium chloride (OCEAN) 0.65 % SOLN nasal spray Place 1 spray into both nostrils as directed. Four times daily And every 24 hours as needed to moisturize nasal passages.    [provider]    Current Facility-Administered Medications  Medication Dose Route Frequency Provider Last Rate Last Dose  . 0.9 %  sodium chloride infusion   Intravenous Continuous Oswald Hillock, MD 10 mL/hr at 07/03/17 0902    . acetaminophen (TYLENOL) tablet 650 mg  650 mg Oral Q6H PRN Bodenheimer, Charles A, NP   650 mg at 07/04/17 1023  . amLODipine  (NORVASC) tablet 2.5 mg  2.5 mg Oral Daily Oswald Hillock, MD   2.5 mg at 07/06/17 0900  . aspirin-acetaminophen-caffeine (EXCEDRIN MIGRAINE) per tablet 1 tablet  1 tablet Oral Daily Elodia Florence., MD      . baclofen (LIORESAL) tablet 5 mg  5 mg Oral QHS Oswald Hillock, MD   5 mg at 07/05/17 2008  . bisacodyl (DULCOLAX) suppository 10 mg  10 mg Rectal Daily PRN Elodia Florence., MD   10 mg at 07/06/17 1030  . cyanocobalamin tablet 500 mcg  500 mcg Oral QPM Oswald Hillock, MD   500 mcg at 07/05/17 1736  . cyclobenzaprine (FLEXERIL) tablet 5 mg  5 mg Oral TID PRN Elodia Florence., MD      . diclofenac sodium (VOLTAREN) 1 % transdermal gel 2 g  2 g Topical QID Elodia Florence., MD   2 g at 07/06/17 1320  . dorzolamide (TRUSOPT) 2 % ophthalmic solution 1 drop  1 drop Both Eyes TID Oswald Hillock, MD   1 drop at 07/06/17 0900  . escitalopram (LEXAPRO) tablet 10 mg  10 mg Oral Daily Oswald Hillock, MD   10 mg at 07/06/17 0900  . famotidine (PEPCID) tablet 20 mg  20 mg Oral QHS Oswald Hillock, MD   20 mg at 07/05/17 2008  . fluticasone (FLONASE) 50 MCG/ACT nasal spray 2 spray  2 spray Each Nare QHS Oswald Hillock, MD   2 spray at 07/05/17 2002  . gabapentin (NEURONTIN) capsule 100 mg  100 mg Oral QHS Oswald Hillock, MD   100 mg at 07/05/17 2007  . guaiFENesin-dextromethorphan (ROBITUSSIN DM) 100-10 MG/5ML syrup 5 mL  5 mL Oral Q4H PRN [place], Marge Duncans, MD      . latanoprost (XALATAN) 0.005 % ophthalmic solution 1 drop  1 drop Both Eyes QHS Oswald Hillock, MD   1 drop at 07/05/17 2003  . lidocaine (LIDODERM) 5 % 1 patch  1 patch Transdermal Q24H Elodia Florence., MD   1 patch at 07/06/17 1320  . loperamide (IMODIUM) capsule 2 mg  2 mg Oral TID PRN Oswald Hillock, MD   2 mg at 06/29/17 2228  . loratadine (CLARITIN) tablet 10 mg  10 mg Oral QAC breakfast Oswald Hillock, MD   10 mg at 07/06/17 0853  . ondansetron (ZOFRAN) tablet 4 mg  4 mg Oral Q6H PRN Oswald Hillock, MD       Or  .  ondansetron (ZOFRAN) injection 4 mg  4 mg Intravenous Q6H PRN Oswald Hillock, MD      . polyethylene glycol (MIRALAX / GLYCOLAX) packet 17 g  17 g Oral BID Elodia Florence., MD   17 g at 07/06/17 1200  . potassium chloride SA (K-DUR,KLOR-CON) CR tablet 40 mEq  40 mEq Oral Once Elodia Florence., MD        Allergies as of 06/28/2017 - Review Complete 06/28/2017  Allergen Reaction Noted  . Aspirin Other (See Comments) 05/26/2013    Family History  Problem Relation Age of Onset  . Cancer Unknown   . Heart disease Unknown     Social History   Socioeconomic History  . Marital status: Widowed    Spouse name: Not on file  . Number of children: 5  . Years of education: 10th  . Highest education level: Not on file  Social Needs  . Financial resource strain: Not on file  . Food insecurity - worry: Not on file  . Food insecurity - inability: Not on file  . Transportation needs - medical: Not on file  . Transportation needs - non-medical: Not on file  Occupational History  . Occupation: Retired  Tobacco Use  . Smoking status: Never Smoker  . Smokeless tobacco: Never Used  Substance and Sexual Activity  . Alcohol use: No  .  Drug use: No  . Sexual activity: No  Other Topics Concern  . Not on file  Social History Narrative   Pt lives at [place]    Caffeine Use: very small amount daily    Review of Systems: Pertinent positive and negative review of systems were noted in the above HPI section.  All other review of systems was otherwise negative.  Physical Exam: Vital signs in last 24 hours: Temp:  [98.1 F (36.7 C)-98.7 F (37.1 C)] 98.7 F (37.1 C) (02/01 1526) Pulse Rate:  [73-87] 75 (02/01 1526) Resp:  [18-20] 18 (02/01 1526) BP: (129-137)/(62-75) 137/74 (02/01 1526) SpO2:  [94 %-97 %] 96 % (02/01 1526) Last BM Date: 07/06/17(Clear gel from rectum) General:   Alert,  Well-developed, elderly WFwell-nourished, pleasant and cooperative in NAD,minimally  conversant Head:  Normocephalic and atraumatic. Eyes:  Sclera clear, no icterus.   Conjunctiva pink. Ears:  Normal auditory acuity. Nose:  No deformity, discharge,  or lesions. Mouth:  No deformity or lesions.   Neck:  Supple; no masses or thyromegaly. Lungs:  Clear throughout to auscultation.   No wheezes, crackles, or rhonchi. Heart:  Regular rate and rhythm; no murmurs, clicks, rubs,  or gallops. Abdomen:  Soft, distended, not tense, tympanitic, BS quiet , no focal tenderness   Rectal:  Deferred  Msk:  Symmetrical without gross deformities. . Pulses:  Normal pulses noted. Extremities:  Without clubbing or edema. Neurologic:  Alert and  oriented x4;  grossly normal neurologically. Skin:  Intact without significant lesions or rashes.. Psych:  Alert and cooperative. Normal mood and affect.  Intake/Output from previous day: 01/31 0701 - 02/01 0700 In: 385.3 [P.O.:120; I.V.:265.3] Out: -  Intake/Output this shift: Total I/O In: 210.3 [P.O.:120; I.V.:90.3] Out: -   Lab Results: Recent Labs    07/05/17 0501 07/06/17 0420  WBC 4.8 4.4  HGB 9.3* 9.4*  HCT 27.2* 27.8*  PLT 117* 136*   BMET Recent Labs    07/04/17 0510 07/05/17 0501 07/06/17 0420  NA 140 141 142  K 2.9* 3.2* 3.5  CL 118* 119* 119*  CO2 17* 18* 18*  GLUCOSE 106* 116* 118*  BUN 8 9 8   CREATININE 1.01* 1.03* 0.98  CALCIUM 8.3* 8.6* 8.7*       IMPRESSION:   #1  84 yo WF with hx of chronic constipation  , and hx of recurrent sigmoid volvulus  ( last required decompression 11/17)  Admitted with 3 day hx of diarrhea on 1/24 /2019  Infectious workup was negative Diarrhea has resolved and now not having Bm's  Pt had a large dilated sigmoid on CT on admit, and now with more of a diffuse ileus , colon predominant  Etiology ? acute gastroenteritis, or secondary to UTI  She has been given anti diarrheals as well  #2 dementia  #3 CAD  #4 CHF #5 Chronid respiratory failure  #6 CKD  #7 recurrent UTI's -  has ESBL /ecoli   Plan; Full liquid diet Around the clock Zofran Repeat abdominal films Place rectal tube to vent colon  Keep K above 3.5 Stop imodium, stop dulcolax  Get pt OOB if possible    We will follow   [name]  07/06/2017, 4:12 PM  ________________________________________________________________________  Velora Heckler GI MD note:  I personally examined the patient, reviewed the data and agree with the assessment and plan described above.  We stopped her anti-diarrheal meds.  Rectal tube placed (to open air). Need to keep her mobilized to help expel the  trapped gas (on right side for 1 hour, then on left side for one hour, repeat throughout the day; keeping her oob as much as possible, I don't know that she can ambulate but that would certainly help if she can).  Continue full liquid diet.  Keep electrolytes in normal range (bmet, mag daily).  Awaiting today's KUB.    Owens Loffler, MD [facility] Pager [phone]

## 2017-07-06 NOTE — Plan of Care (Signed)
  Activity: Risk for activity intolerance will decrease 07/06/2017 1422 - Not Progressing by Dorene Sorrow, RN   Nutrition: Adequate nutrition will be maintained 07/06/2017 1422 - Progressing by Dorene Sorrow, RN   Skin Integrity: Risk for impaired skin integrity will decrease 07/06/2017 1422 - Progressing by Dorene Sorrow, RN

## 2017-07-06 NOTE — Progress Notes (Signed)
07/06/17  1110  Patient has clear gel excreting from rectum. MD notified.

## 2017-07-07 ENCOUNTER — Inpatient Hospital Stay (HOSPITAL_COMMUNITY): Payer: Medicare Other

## 2017-07-07 DIAGNOSIS — R140 Abdominal distension (gaseous): Secondary | ICD-10-CM

## 2017-07-07 DIAGNOSIS — K567 Ileus, unspecified: Secondary | ICD-10-CM

## 2017-07-07 DIAGNOSIS — K9189 Other postprocedural complications and disorders of digestive system: Secondary | ICD-10-CM

## 2017-07-07 LAB — BASIC METABOLIC PANEL
ANION GAP: 5 (ref 5–15)
BUN: 7 mg/dL (ref 6–20)
CO2: 19 mmol/L — ABNORMAL LOW (ref 22–32)
Calcium: 8.5 mg/dL — ABNORMAL LOW (ref 8.9–10.3)
Chloride: 118 mmol/L — ABNORMAL HIGH (ref 101–111)
Creatinine, Ser: 0.95 mg/dL (ref 0.44–1.00)
GFR, EST AFRICAN AMERICAN: 59 mL/min — AB (ref >60)
GFR, EST NON AFRICAN AMERICAN: 51 mL/min — AB (ref >60)
Glucose, Bld: 106 mg/dL — ABNORMAL HIGH (ref 65–99)
POTASSIUM: 3.7 mmol/L (ref 3.5–5.1)
SODIUM: 142 mmol/L (ref 135–145)

## 2017-07-07 LAB — MAGNESIUM: MAGNESIUM: 1.9 mg/dL (ref 1.7–2.4)

## 2017-07-07 NOTE — Progress Notes (Signed)
PROGRESS NOTE    Maria Christian  VZD:[id number] DOB: 12/13/1924 DOA: 06/28/2017 PCP: [name], DO   Brief Narrative:  84 yo female,with history of atrial fibrillation, not on chronic anticoagulation, take aspirin for anticoagulation, depression, chronic respiratory failure,chronic constipation with history of recurrent sigmoid volvulus,hypertension, CKD stage III, chronic diastolic heart failure who came to hospital with chief complaints of diarrhea.Initially patient was sent from the skilled facility for concern for SBO as x-ray of the abdomen showed findings consistent with small bowel obstruction. Patient complains of abdominal pain with watery diarrhea for past three days. Also noted intermittent bright red blood in the stool. She has had poor PO intake. Denies any vomiting. Complains of dysuria  Assessment & Plan:   Active Problems:   Volvulus of sigmoid colon (HCC)   Diarrhea   UTI (urinary tract infection)    1. History of recurrent sigmoid volvulus  Abdominal distention-CT abdomen on 1/24 unchanged from previous.  Suspected related to functional abnormality.  No volvulus seen.  She had worsening distention over the past few days and repeat KUB 1/31 showed likely pseudoobstruction of the elderly.  No BM since 1/30.  Given suppository, started bowel regimen.  Sounds like she has some mucoid stool, but no normal BM.  Consulted GI   1. GI c/s, appreciate recs (OOB, mobilize)  2. UTI-patient has abnormal UA,she has history of ESBL E. coli. Started on Zosyn per pharmacy. Urine culture showed mixed species.  Pt with history of ESBL and started on fosfomycin (1/26, 1/29, and 2/1). Zosyn has been discontinued and patient started on Fosfomycin which is now complete.  3. Neck pain-  Continue Flexeril 5 mg PO TID as needed.  Voltaren, lidocaine.  4. RLE swelling- [name] negative for DVT.  5. Left hip pain-complains of left hip pain and LLE pain.  X rays without fracture.  Well  positioned L hip arthroplasty.  Will continue to monitor.  Lidocaine patch and voltaren gel.  Son notes this LLE pain is chronic.   6. Diarrhea- improved.  Imodium discontinued.  Bowel regimen started given distension.  Negative C diff.  CT of the abdomen and pelvis demonstrates no volvulus and no significant change from prior exam.  Continue to monitor.   7. Atrial fibrillation- Has had some burst of RVR with rates into the 120's, generally short lasting.  Beta blocker was d/c'd given below.  Will continue to monitor given short duration.  takes aspirin for anticoagulation. Will hold aspirin this time due to recent history of blood in the stool.  8. Bradycardia- improved after stopping Coreg, patient heart rate dropped to 30s while sleeping, she has been asymptomatic. HR currently improved.    9. Acute kidney injury on CKD stage III-patient's creatinine peaked to 2.3 on admission.  Baseline creatinine is 1.4-1.5. Creatinine continuing to improve.  Continue to monitor.   10. Chronic diastolic CHF-patient is currentlyeuvolemic, torsemide is on hold.   11. Hypokalemia- follow magnesium.  Improved.  Will continue to replete prn.   12. Depression-continue Lexapro  13. Hypertension-continue Coreg, Norvasc  14. NAGMA: chloride 118, bicarb 17-18 consistently over past few days.  Continue to monitor for now  DVT prophylaxis: SCD Code Status: DNR Family Communication: none at bedside Disposition Plan: pending  Consultants:   none  Procedures:   none  Antimicrobials:  Anti-infectives (From admission, onward)   Start     Dose/Rate Route Frequency Ordered Stop   06/28/17 2330  piperacillin-tazobactam (ZOSYN) IVPB 2.25 g  Status:  Discontinued  2.25 g 100 mL/hr over 30 Minutes Intravenous Every 8 hours 06/28/17 1736 06/30/17 0901   06/28/17 1515  piperacillin-tazobactam (ZOSYN) IVPB 3.375 g     3.375 g 100 mL/hr over 30 Minutes Intravenous  Once 06/28/17 1504 06/28/17 1718      Fosfomycin 1/26, 1/29 Subjective: A&Ox3 (prompting for month) No complaints. Belly better.   Objective: Vitals:   07/06/17 0854 07/06/17 1526 07/06/17 2250 07/07/17 0637  BP: 129/62 137/74 (!) 141/59 133/72  Pulse: 77 75 63 77  Resp: 20 18 18 18   Temp:  98.7 F (37.1 C) 98.7 F (37.1 C) 98.4 F (36.9 C)  TempSrc:  Oral Oral Oral  SpO2: 94% 96% 97% 95%  Weight:      Height:        Intake/Output Summary (Last 24 hours) at 07/07/2017 1121 Last data filed at 07/07/2017 8315 Gross per 24 hour  Intake 880.16 ml  Output -  Net 880.16 ml   Filed Weights   06/28/17 1957  Weight: 69 kg (152 lb 3.2 oz)    Examination:  General: No acute distress. Cardiovascular: Heart sounds show a regular rate, and rhythm. No gallops or rubs. No murmurs. No JVD. Lungs: Clear to auscultation bilaterally with good air movement. No rales, rhonchi or wheezes. Abdomen: Soft, nontender, distended, but improving.  Quiet bowel sounds.   No masses. No hepatosplenomegaly. Neurological: Alert and oriented 3. Moves all extremities 4 with equal strength. Cranial nerves II through XII grossly intact. Skin: Warm and dry. No rashes or lesions. Extremities: No clubbing or cyanosis. R>L LEE Psychiatric: Mood and affect are normal. Insight and judgment are appropriate.    Data Reviewed: I have personally reviewed following labs and imaging studies  CBC: Recent Labs  Lab 07/05/17 0501 07/06/17 0420  WBC 4.8 4.4  HGB 9.3* 9.4*  HCT 27.2* 27.8*  MCV 101.5* 101.8*  PLT 117* 176*   Basic Metabolic Panel: Recent Labs  Lab 07/01/17 0900  07/03/17 0459 07/04/17 0510 07/05/17 0501 07/06/17 0420 07/07/17 0452  NA  --    < > 139 140 141 142 142  K  --    < > 2.8* 2.9* 3.2* 3.5 3.7  CL  --    < > 116* 118* 119* 119* 118*  CO2  --    < > 18* 17* 18* 18* 19*  GLUCOSE  --    < > 131* 106* 116* 118* 106*  BUN  --    < > 8 8 9 8 7   CREATININE  --    < > 0.97 1.01* 1.03* 0.98 0.95  CALCIUM  --    < >  8.5* 8.3* 8.6* 8.7* 8.5*  MG 1.8  --   --  1.9 2.0 2.0 1.9   < > = values in this interval not displayed.   GFR: Estimated Creatinine Clearance: 38.3 mL/min (by C-G formula based on SCr of 0.95 mg/dL). Liver Function Tests: No results for input(s): AST, ALT, ALKPHOS, BILITOT, PROT, ALBUMIN in the last 168 hours. No results for input(s): LIPASE, AMYLASE in the last 168 hours. No results for input(s): AMMONIA in the last 168 hours. Coagulation Profile: No results for input(s): INR, PROTIME in the last 168 hours. Cardiac Enzymes: No results for input(s): CKTOTAL, CKMB, CKMBINDEX, TROPONINI in the last 168 hours. BNP (last 3 results) No results for input(s): PROBNP in the last 8760 hours. HbA1C: No results for input(s): HGBA1C in the last 72 hours. CBG: No results for input(s): GLUCAP in  the last 168 hours. Lipid Profile: No results for input(s): CHOL, HDL, LDLCALC, TRIG, CHOLHDL, LDLDIRECT in the last 72 hours. Thyroid Function Tests: No results for input(s): TSH, T4TOTAL, FREET4, T3FREE, THYROIDAB in the last 72 hours. Anemia Panel: No results for input(s): VITAMINB12, FOLATE, FERRITIN, TIBC, IRON, RETICCTPCT in the last 72 hours. Sepsis Labs: No results for input(s): PROCALCITON, LATICACIDVEN in the last 168 hours.  Recent Results (from the past 240 hour(s))  Urine culture     Status: Abnormal   Collection Time: 06/28/17 12:13 PM  Result Value Ref Range Status   Specimen Description   Final    URINE, RANDOM Performed at [facility], [address] [place], [place] [address]    Special Requests NONE  Final   Culture MULTIPLE SPECIES PRESENT, SUGGEST RECOLLECTION (A)  Final   Report Status 06/29/2017 FINAL  Final  Gastrointestinal Panel by PCR , Stool     Status: None   Collection Time: 06/28/17  6:59 PM  Result Value Ref Range Status   Campylobacter species NOT DETECTED NOT DETECTED Final   Plesimonas shigelloides NOT DETECTED NOT DETECTED Final   Salmonella species  NOT DETECTED NOT DETECTED Final   Yersinia enterocolitica NOT DETECTED NOT DETECTED Final   Vibrio species NOT DETECTED NOT DETECTED Final   Vibrio cholerae NOT DETECTED NOT DETECTED Final   Enteroaggregative E coli (EAEC) NOT DETECTED NOT DETECTED Final   Enteropathogenic E coli (EPEC) NOT DETECTED NOT DETECTED Final   Enterotoxigenic E coli (ETEC) NOT DETECTED NOT DETECTED Final   Shiga like toxin producing E coli (STEC) NOT DETECTED NOT DETECTED Final   Shigella/Enteroinvasive E coli (EIEC) NOT DETECTED NOT DETECTED Final   Cryptosporidium NOT DETECTED NOT DETECTED Final   Cyclospora cayetanensis NOT DETECTED NOT DETECTED Final   Entamoeba histolytica NOT DETECTED NOT DETECTED Final   Giardia lamblia NOT DETECTED NOT DETECTED Final   Adenovirus F40/41 NOT DETECTED NOT DETECTED Final   Astrovirus NOT DETECTED NOT DETECTED Final   Norovirus GI/GII NOT DETECTED NOT DETECTED Final   Rotavirus A NOT DETECTED NOT DETECTED Final   Sapovirus (I, II, IV, and V) NOT DETECTED NOT DETECTED Final    Comment: Performed at [facility], [address] [place], [place] [address]  C difficile quick scan w PCR reflex     Status: None   Collection Time: 06/28/17  6:59 PM  Result Value Ref Range Status   C Diff antigen NEGATIVE NEGATIVE Final   C Diff toxin NEGATIVE NEGATIVE Final   C Diff interpretation No C. difficile detected.  Final  MRSA PCR Screening     Status: None   Collection Time: 06/28/17  8:00 PM  Result Value Ref Range Status   MRSA by PCR NEGATIVE NEGATIVE Final    Comment:        The GeneXpert MRSA Assay (FDA approved for NASAL specimens only), is one component of a comprehensive MRSA colonization surveillance program. It is not intended to diagnose MRSA infection nor to guide or monitor treatment for MRSA infections.          Radiology Studies: Dg Abd 1 View  Result Date: 07/05/2017 CLINICAL DATA:  Followup distended abdomen. EXAM: ABDOMEN - 1 VIEW  COMPARISON:  06/28/2017 FINDINGS: Continued gaseous distension of the small and large bowel, colon predominant. Findings could be due to ileus or pseudo obstruction of the elderly. Similar pattern is been seen on prior imaging for the last several years. IMPRESSION: Most likely diagnosis is  pseudo obstruction of the elderly. Persistent findings. Can't rule out actual ileus. Electronically Signed   By: Nelson Chimes M.D.   On: 07/05/2017 14:26   Dg Abd 2 Views  Result Date: 07/07/2017 CLINICAL DATA:  Left-sided abdominal, hip and back pain. EXAM: ABDOMEN - 2 VIEW COMPARISON:  07/05/2017 FINDINGS: Nonobstructive bowel gas pattern. Prominence of the colon, improved from the prior radiograph. Moderate stool burden. Postsurgical changes in the right upper quadrant. Vascular calcifications noted. Large bore catheter overlies the pelvis. Degenerative without evidence of acute fractures. Post total left hip arthroplasty with chronic pelvis deformity. IMPRESSION: Nonobstructive bowel gas pattern. Improved prominence of the colon. Electronically Signed   By: Fidela Salisbury M.D.   On: 07/07/2017 10:07        Scheduled Meds: . amLODipine  2.5 mg Oral Daily  . aspirin-acetaminophen-caffeine  1 tablet Oral Daily  . baclofen  5 mg Oral QHS  . cyanocobalamin  500 mcg Oral QPM  . diclofenac sodium  2 g Topical QID  . dorzolamide  1 drop Both Eyes TID  . escitalopram  10 mg Oral Daily  . famotidine  20 mg Oral QHS  . fluticasone  2 spray Each Nare QHS  . gabapentin  100 mg Oral QHS  . latanoprost  1 drop Both Eyes QHS  . lidocaine  1 patch Transdermal Q24H  . loratadine  10 mg Oral QAC breakfast  . ondansetron (ZOFRAN) IV  4 mg Intravenous Q8H   Or  . ondansetron  4 mg Oral Q8H  . polyethylene glycol  17 g Oral BID   Continuous Infusions: . sodium chloride 50 mL/hr at 07/06/17 1906     LOS: 9 days    Time spent: over 61 min    Fayrene Helper, MD [facility] Pager  [phone]  If 7PM-7AM, please contact night-coverage [email or web address] Password [facility] 07/07/2017, 11:21 AM

## 2017-07-08 LAB — BASIC METABOLIC PANEL
ANION GAP: 4 — AB (ref 5–15)
BUN: 9 mg/dL (ref 6–20)
CALCIUM: 8.6 mg/dL — AB (ref 8.9–10.3)
CO2: 19 mmol/L — AB (ref 22–32)
CREATININE: 1.05 mg/dL — AB (ref 0.44–1.00)
Chloride: 119 mmol/L — ABNORMAL HIGH (ref 101–111)
GFR calc Af Amer: 53 mL/min — ABNORMAL LOW (ref >60)
GFR calc non Af Amer: 45 mL/min — ABNORMAL LOW (ref >60)
GLUCOSE: 107 mg/dL — AB (ref 65–99)
Potassium: 3.8 mmol/L (ref 3.5–5.1)
Sodium: 142 mmol/L (ref 135–145)

## 2017-07-08 LAB — CBC
HEMATOCRIT: 26.3 % — AB (ref 36.0–46.0)
Hemoglobin: 8.8 g/dL — ABNORMAL LOW (ref 12.0–15.0)
MCH: 34.6 pg — AB (ref 26.0–34.0)
MCHC: 33.5 g/dL (ref 30.0–36.0)
MCV: 103.5 fL — AB (ref 78.0–100.0)
Platelets: 135 10*3/uL — ABNORMAL LOW (ref 150–400)
RBC: 2.54 MIL/uL — ABNORMAL LOW (ref 3.87–5.11)
RDW: 14.8 % (ref 11.5–15.5)
WBC: 3.5 10*3/uL — ABNORMAL LOW (ref 4.0–10.5)

## 2017-07-08 LAB — MAGNESIUM: Magnesium: 1.9 mg/dL (ref 1.7–2.4)

## 2017-07-08 LAB — HEMOGLOBIN AND HEMATOCRIT, BLOOD
HEMATOCRIT: 27.2 % — AB (ref 36.0–46.0)
Hemoglobin: 9.1 g/dL — ABNORMAL LOW (ref 12.0–15.0)

## 2017-07-08 NOTE — Progress Notes (Signed)
Pt discharged to SNF. Left unit on stretcher pushed by ambulance staff and accompanied by daughter. Left in stable condition. Report called and given to [name], nurse  at nursing facility. All nurse's questions answered to her satisfaction.  Maria Christian.

## 2017-07-08 NOTE — Discharge Summary (Addendum)
Physician Discharge Summary  Maria Christian HWE:[id number] DOB: 12/05/1924 DOA: 06/28/2017  PCP: Maria Cranker, DO  Admit date: 06/28/2017 Discharge date: 07/08/2017  Time spent: over 30 minutes  Recommendations for Outpatient Follow-up:  1. Follow up outpatient CBC/CMP (close attention to hemoglobin and electrolytes/potassium at follow up) 2. Follow up heart rate (beta blocker discontinued due to bradycardia) 3. Follow up H/H (relatively stable at discharge, some concern for blood in stool prior to presentation and positive hemoccult here.  Patient with brown stool on day of discharge.  Would follow H/H within 1-2 days.  Consider GI follow up.  Continue to hold aspirin and aspirin containing meds.  4. For her abdominal distension and history of sigmoid volvulus and ileus vs pseudoobstruction, continue to mobilize patient, out of bed to chair as much as possible and turning right to left when in bed 5. Continue previously prescribed potassium, follow potassium in next few days and adjust dose as needed 6. Resumed torsemide on discharge (held in the hospital with her hypokalemia).  Would continue to watch volume status and potassium as well as renal function with resumption in considering whether to continue long term 7. Consider bicarb for metabolic acidosis 8. Not on asa or anticoagulation for afib, continue to eval for risk/benefits going forward   Discharge Diagnoses:  Active Problems:   Volvulus of sigmoid colon (HCC)   Diarrhea   UTI (urinary tract infection)   Discharge Condition: stable  Diet recommendation: heart healthy  Filed Weights   06/28/17 1957  Weight: 69 kg (152 lb 3.2 oz)    History of present illness:  Per HPI 84 yo female,with history of atrial fibrillation, not on chronic anticoagulation, take aspirin for anticoagulation, depression, chronic respiratory failure,chronic constipation with history of recurrent sigmoid volvulus,hypertension, CKD stage III,  chronic diastolic heart failure who came to hospital with chief complaints of diarrhea.Initially patient was sent from the skilled facility for concern for SBO as x-ray of the abdomen showed findings consistent with small bowel obstruction. Patient complains of abdominal pain with watery diarrhea for past three days. Also noted intermittent bright red blood in the stool. She has had poor PO intake. Denies any vomiting. Complains of dysuria  Hospital Course:  1. History of recurrent sigmoid volvulus  Abdominal distention  Ileus vs Pseudoobstruction-CT abdomen on 1/24 unchanged from previous.  Suspected related to functional abnormality.  No volvulus seen.  She had worsening distention over the past few days and repeat KUB 1/31 showed likely pseudoobstruction of the elderly.  No BM since 1/30.  Given suppository, started bowel regimen.  Sounds like she has some mucoid stool, but no normal BM.  Consulted GI who placed rectal tube and encouraged OOB and mobilization.  Pt with brown BM on 2/3.  Will discharge, continue to mobilize, OOB.  Continue prior to admission bowel regimen at d/c.    1. GI c/s, appreciate recs (OOB, mobilize)  2. UTI-patient has abnormal UA,she has history of ESBL E. coli. Ucx with multiple species.  Initially on zosyn and then completed fosfomycin x 3 doses.   3. Neck pain-  Received Flexeril 5 mg PO TID as needed.  Voltaren, lidocaine.  No complaint of pain on d/c.  4. RLE swelling- [name] negative for DVT (though limited)  5. Left hip pain-complained of left hip pain and LLE pain, though no complaint of pain on discharge.  X rays without fracture.  Well positioned L hip arthroplasty.  Will continue to monitor.  Lidocaine patch and voltaren gel.  Son notes this LLE pain is chronic.   6. Diarrhea- Negative C diff.  CT of the abdomen and pelvis demonstrates no volvulus and no significant change from prior exam.  Continue to monitor.  Improved.  7. Hemoccult Positive Stool:  concern for blood in stool at presentation.  Hemoccult positive.  Patient's aspirin was stopped. Her hb on discharge had been relatively stable to low 9 range since 1/26.  She had [name] brown stool on the day of discharge.  Would continue to monitor H/H as outpatient and watch for blood in stool.  Consider GI follow up as outpatient.  Hold aspirin.    7. Atrial fibrillation- Has had some burst of RVR with rates into the 120's, generally short lasting.  Beta blocker was d/c'd given bradycardia.  Will continue to monitor given short duration.  takes aspirin for anticoagulation. Will hold aspirin this time due to recent history of blood in the stool.  8. Bradycardia- improved after stopping Coreg, patient heart rate dropped to 30s while sleeping, she has been asymptomatic. HR currently improved.    9. Acute kidney injury on CKD stage III-patient's creatinine peaked to 2.3 on admission.  Baseline creatinine is 1.4-1.5. Creatinine improved on day of discharge.  Continue to monitor.   10. Chronic diastolic CHF-patient is currentlyeuvolemic, torsemide is on hold.   11. Hypokalemia- follow magnesium.  Improved.  Will continue to replete prn.   12. Depression-continue Lexapro  13. Hypertension-stop Coreg, continue Norvasc  14. NAGMA: chloride 118, bicarb 17-18 consistently over past few days.  Continue to monitor for now.  Consider starting bicarb.      Procedures:  none   Consultations:  gastroentrology  Discharge Exam: Vitals:   07/07/17 2213 07/08/17 0450  BP: 139/75 129/66  Pulse: 77 71  Resp: 20 16  Temp: 98.5 F (36.9 C) 98.4 F (36.9 C)  SpO2: 94% 97%   Belly feels better.  Excited to be going home.    General: No acute distress. Cardiovascular: Heart sounds show Lenore Moyano regular rate, and rhythm. No gallops or rubs. No murmurs. No JVD. Lungs: Clear to auscultation bilaterally with good air movement. No rales, rhonchi or wheezes. Abdomen: Soft, nontender, mildly distended.   with quiet bowel sounds. No masses. No hepatosplenomegaly. Neurological: Alert and oriented 3. Moves all extremities 4. Cranial nerves II through XII grossly intact. Skin: Warm and dry. No rashes or lesions. Extremities: No clubbing or cyanosis. R>L trace edema.  Psychiatric: Mood and affect are normal. Insight and judgment are appropriate.   Discharge Instructions   Discharge Instructions    Call MD for:  difficulty breathing, headache or visual disturbances   Complete by:  As directed    Call MD for:  extreme fatigue   Complete by:  As directed    Call MD for:  persistant dizziness or light-headedness   Complete by:  As directed    Call MD for:  persistant nausea and vomiting   Complete by:  As directed    Call MD for:  severe uncontrolled pain   Complete by:  As directed    Call MD for:  temperature >100.4   Complete by:  As directed    Diet - low sodium heart healthy   Complete by:  As directed    Diet - low sodium heart healthy   Complete by:  As directed    Discharge instructions   Complete by:  As directed    You were seen for Quinnton Bury urinary tract infection and  abdominal distension.  You were treated for Siddhartha Hoback UTI.  Gastroenterology saw you and placed Trevone Prestwood rectal tube to help decompress you.  You had Montoya Watkin normal bowel movement on the day of discharge.  Continue your bowel regimen.  You also had low potassium.  This is better now.  Resume your home potassium regimen.  We did not give your bumex while you were here, restart this when you go home, but you'll need follow up labs and follow up exam to make sure this is something that should be continued long term.  We stopped your carvedilol because your heart rate was slow.  Continue to follow this up with your primary doctor.  We also stopped your aspirin because there was concern for blood in your stool.  Your blood counts were stable on the day of discharge.  Please follow up with your PCP about when or if to resume this and to further follow  up the findings in your stool.  Stop your excedrin migraine as well as this has aspirin (try tylenol instead or another medicine without aspirin).  Return if you have new, recurrent, or worsening symptoms.  Please ask your PCP to request records from this hospitalization so they know what was done and the next steps.   Increase activity slowly   Complete by:  As directed    Increase activity slowly   Complete by:  As directed      Allergies as of 07/08/2017      Reactions   Aspirin Other (See Comments)   G.I. Upset only      Medication List    STOP taking these medications   aspirin EC 81 MG tablet   aspirin-acetaminophen-caffeine 250-250-65 MG tablet Commonly known as:  EXCEDRIN MIGRAINE   carvedilol 3.125 MG tablet Commonly known as:  COREG   cephALEXin 500 MG capsule Commonly known as:  KEFLEX     TAKE these medications   acetaminophen 325 MG tablet Commonly known as:  TYLENOL Take 650 mg by mouth daily as needed (OSTEOARTHRITIS).   amLODipine 2.5 MG tablet Commonly known as:  NORVASC Take 2.5 mg by mouth every morning. Hold for BP < 100/60 or HR < 60   AZO TABS PO Take 1 tablet by mouth at bedtime.   baclofen 10 MG tablet Commonly known as:  LIORESAL Take 5 mg by mouth at bedtime.   calcium carbonate 500 MG chewable tablet Commonly known as:  TUMS - dosed in mg elemental calcium Chew 2 tablets by mouth 3 (three) times daily.   HAIR SKIN AND NAILS FORMULA Tabs Take by mouth. Give 2 tablets by mouth daily   CERTAGEN PO Take 1 tablet by mouth every evening.   CHLORASEPTIC 6-10 MG lozenge Generic drug:  benzocaine-menthol Take 1 lozenge by mouth as needed for sore throat.   Cranberry 475 MG Caps Take 1 capsule by mouth 2 (two) times daily.   diclofenac sodium 1 % Gel Commonly known as:  VOLTAREN Apply 2 grams to neck topically three times Wally Shevchenko day for pain   docusate sodium 100 MG capsule Commonly known as:  COLACE Take 200 mg by mouth at bedtime.    dorzolamide 2 % ophthalmic solution Commonly known as:  TRUSOPT Place 1 drop into both eyes 3 (three) times daily.   escitalopram 10 MG tablet Commonly known as:  LEXAPRO Take 10 mg by mouth daily.   famotidine 20 MG tablet Commonly known as:  PEPCID Take 20 mg by mouth at bedtime.  ferrous sulfate 220 (44 Fe) MG/5ML solution Give 5 ml by mouth in the evening for supplement   fluticasone 50 MCG/ACT nasal spray Commonly known as:  FLONASE Place 2 sprays into both nostrils at bedtime.   gabapentin 100 MG capsule Commonly known as:  NEURONTIN Take 100 mg by mouth at bedtime.   LINZESS 145 MCG Caps capsule Generic drug:  linaclotide Take 145 mcg by mouth daily before breakfast.   loratadine 10 MG tablet Commonly known as:  CLARITIN Take 10 mg by mouth daily before breakfast.   nystatin cream Commonly known as:  MYCOSTATIN Apply to buttocks topically two times Landi Biscardi day for redness   omeprazole 20 MG capsule Commonly known as:  PRILOSEC Take 20 mg by mouth daily.   ondansetron 8 MG tablet Commonly known as:  ZOFRAN Take 8 mg by mouth every 8 (eight) hours as needed for nausea or vomiting.   polyethylene glycol packet Commonly known as:  MIRALAX / GLYCOLAX Take 17 g by mouth daily.   potassium chloride SA 20 MEQ tablet Commonly known as:  K-DUR,KLOR-CON Take 20-40 mEq by mouth 2 (two) times daily. Give 40 meq (2 tablets) in the morning and Give 20 meq (1 tablet) by mouth in the afternoon.   senna 8.6 MG Tabs tablet Commonly known as:  SENOKOT Take 1 tablet (8.6 mg total) by mouth 2 (two) times daily.   sodium chloride 0.65 % Soln nasal spray Commonly known as:  OCEAN Place 1 spray into both nostrils as directed. Four times daily And every 24 hours as needed to moisturize nasal passages.   torsemide 20 MG tablet Commonly known as:  DEMADEX Take 20 mg by mouth daily.   Travoprost (BAK Free) 0.004 % Soln ophthalmic solution Commonly known as:  TRAVATAN Place 1  drop into both eyes daily at 8 pm.   vitamin B-12 500 MCG tablet Commonly known as:  CYANOCOBALAMIN Take 500 mcg by mouth every evening.      Allergies  Allergen Reactions  . Aspirin Other (See Comments)    G.I. Upset only    Contact information for follow-up providers    Maria Cranker, DO Follow up.   Specialty:  Internal Medicine Contact information: [place] [address] [phone]        [name], MD Follow up.   Specialty:  Gastroenterology Contact information: [address] [place] [address] [phone]            Contact information for after-discharge care    Destination    [facility] Follow up.   Service:  Skilled Nursing Contact information: [address] [place] [phone]                   The results of significant diagnostics from this hospitalization (including imaging, microbiology, ancillary and laboratory) are listed below for reference.    Significant Diagnostic Studies: Ct Abdomen Pelvis Wo Contrast  Result Date: 06/28/2017 CLINICAL DATA:  Abdominal pain. Plain film of the abdomen worrisome for small bowel obstruction. EXAM: CT ABDOMEN AND PELVIS WITHOUT CONTRAST TECHNIQUE: Multidetector CT imaging of the abdomen and pelvis was performed following the standard protocol without IV contrast. COMPARISON:  CT abdomen and pelvis 05/09/2017 and 04/17/2016. FINDINGS: Lower chest: There is marked cardiomegaly. No pericardial or pleural effusion. Mild dependent atelectasis in the lung bases. Hepatobiliary: No focal liver abnormality is seen. Status post cholecystectomy. No biliary dilatation. Pancreas: Unremarkable. No  pancreatic ductal dilatation or surrounding inflammatory changes. Spleen: Normal in size without focal abnormality. Adrenals/Urinary Tract: Adrenal glands appear normal. Horseshoe kidney is noted. Vascular calcifications are present and make  it difficult to exclude small nonobstructing stones. No hydronephrosis. Urinary bladder is unremarkable. Stomach/Bowel: The sigmoid colon is markedly distended with an air-fluid level present but no volvulus is identified. There is no evidence of small bowel obstruction. The appendix appears normal. The stomach is unremarkable. Vascular/Lymphatic: Extensive aortoiliac atherosclerosis without aneurysm. No lymphadenopathy. Reproductive: Status post hysterectomy. No adnexal masses. Other: No fluid collection. Musculoskeletal: No acute bony abnormality. Left hip replacement is in place. IMPRESSION: Negative for small bowel obstruction. Marked gaseous distention of Zoa Dowty redundant sigmoid colon is unchanged since the most recent examination and likely related to functional abnormality. No volvulus is seen. Extensive atherosclerosis. Marked cardiomegaly. Horseshoe kidney. Extensive vascular calcifications make it difficult to exclude small nonobstructing stones in the kidney but there is no hydronephrosis. Electronically Signed   By: Inge Rise M.D.   On: 06/28/2017 14:14   Dg Chest 2 View  Result Date: 06/28/2017 CLINICAL DATA:  84 year old female with chest pain and cough for 2 days. Abdominal distension. EXAM: CHEST  2 VIEW COMPARISON:  CT Abdomen and Pelvis 05/09/2017 and earlier. FINDINGS: Semi upright AP and lateral views chest. Stable cardiomegaly and mediastinal contours. Calcified aortic atherosclerosis. Stable lung volumes. No pneumothorax or pneumoperitoneum identified. No pulmonary edema, pleural effusion or confluent pulmonary opacity. Partially visible gas distended bowel loops in the abdomen. No acute osseous abnormality identified. Chronic deformity distal right clavicle is stable. IMPRESSION: 1. No acute cardiopulmonary abnormality. Stable cardiomegaly. Aortic Atherosclerosis (ICD10-I70.0). 2. Partially visible gas distended bowel loops in the abdomen. Electronically Signed   By: Genevie Ann M.D.   On:  06/28/2017 12:47   Dg Abd 1 View  Result Date: 07/05/2017 CLINICAL DATA:  Followup distended abdomen. EXAM: ABDOMEN - 1 VIEW COMPARISON:  06/28/2017 FINDINGS: Continued gaseous distension of the small and large bowel, colon predominant. Findings could be due to ileus or pseudo obstruction of the elderly. Similar pattern is been seen on prior imaging for the last several years. IMPRESSION: Most likely diagnosis is pseudo obstruction of the elderly. Persistent findings. Can't rule out actual ileus. Electronically Signed   By: Nelson Chimes M.D.   On: 07/05/2017 14:26   Dg Abdomen 1 View  Result Date: 06/28/2017 CLINICAL DATA:  Chest pain and cough over the last 2 days. EXAM: ABDOMEN - 1 VIEW COMPARISON:  04/19/2016, multiple prior studies as distant as 10/14/2013 FINDINGS: This patient has multiple previous exams showing Daytona Hedman pseudo obstruction pattern with gaseous distention of the colon. True obstruction is not suspected based on multiple similar exams. No sign of any enteral tube. Clips in the right upper quadrant consistent with previous cholecystectomy. Curvature in degenerative change of the spine. Previous left hip replacement. Aortic atherosclerosis. IMPRESSION: Gaseous distention of the colon, seen on multiple previous exams, consistent with pseudo obstruction of the elderly. Electronically Signed   By: Nelson Chimes M.D.   On: 06/28/2017 13:07   Dg Abd 2 Views  Result Date: 07/07/2017 CLINICAL DATA:  Left-sided abdominal, hip and back pain. EXAM: ABDOMEN - 2 VIEW COMPARISON:  07/05/2017 FINDINGS: Nonobstructive bowel gas pattern. Prominence of the colon, improved from the prior radiograph. Moderate stool burden. Postsurgical changes in the right upper quadrant. Vascular calcifications noted. Large bore catheter overlies the pelvis. Degenerative without evidence of acute fractures. Post total left hip arthroplasty with chronic pelvis deformity. IMPRESSION:  Nonobstructive bowel gas pattern. Improved  prominence of the colon. Electronically Signed   By: Fidela Salisbury M.D.   On: 07/07/2017 10:07   Dg Hips Bilat With Pelvis 3-4 Views  Result Date: 07/03/2017 CLINICAL DATA:  Pt c/o generalized bilat hip pain. Hx of SBO. Watery stools for past 3 days. No injury. Pt has very stiff hip joints and doesn't abduct left hip much at all. EXAM: DG HIP (WITH OR WITHOUT PELVIS) 3-4V BILAT COMPARISON:  None. FINDINGS: No fracture.  No bone lesion.  Bones are diffusely demineralized. Left total hip arthroplasty appears well seated and aligned. Right hip joint is normally spaced and aligned. No significant arthropathic changes. SI joints and symphysis pubis are normally spaced and aligned. There are dense calcifications along the iliac and femoral arteries. There is mild distention of the colon. IMPRESSION: 1. No fracture, bone lesion or acute finding. No significant right hip joint arthropathic change. Well-positioned left hip total arthroplasty. 2. Mild colonic distention, incompletely imaged. Electronically Signed   By: Lajean Manes M.D.   On: 07/03/2017 13:53    Microbiology: Recent Results (from the past 240 hour(s))  Gastrointestinal Panel by PCR , Stool     Status: None   Collection Time: 06/28/17  6:59 PM  Result Value Ref Range Status   Campylobacter species NOT DETECTED NOT DETECTED Final   Plesimonas shigelloides NOT DETECTED NOT DETECTED Final   Salmonella species NOT DETECTED NOT DETECTED Final   Yersinia enterocolitica NOT DETECTED NOT DETECTED Final   Vibrio species NOT DETECTED NOT DETECTED Final   Vibrio cholerae NOT DETECTED NOT DETECTED Final   Enteroaggregative E coli (EAEC) NOT DETECTED NOT DETECTED Final   Enteropathogenic E coli (EPEC) NOT DETECTED NOT DETECTED Final   Enterotoxigenic E coli (ETEC) NOT DETECTED NOT DETECTED Final   Shiga like toxin producing E coli (STEC) NOT DETECTED NOT DETECTED Final   Shigella/Enteroinvasive E coli (EIEC) NOT DETECTED NOT DETECTED Final    Cryptosporidium NOT DETECTED NOT DETECTED Final   Cyclospora cayetanensis NOT DETECTED NOT DETECTED Final   Entamoeba histolytica NOT DETECTED NOT DETECTED Final   Giardia lamblia NOT DETECTED NOT DETECTED Final   Adenovirus F40/41 NOT DETECTED NOT DETECTED Final   Astrovirus NOT DETECTED NOT DETECTED Final   Norovirus GI/GII NOT DETECTED NOT DETECTED Final   Rotavirus Jeriko Kowalke NOT DETECTED NOT DETECTED Final   Sapovirus (I, II, IV, and V) NOT DETECTED NOT DETECTED Final    Comment: Performed at [facility], [address] [place], [place] [address]  C difficile quick scan w PCR reflex     Status: None   Collection Time: 06/28/17  6:59 PM  Result Value Ref Range Status   C Diff antigen NEGATIVE NEGATIVE Final   C Diff toxin NEGATIVE NEGATIVE Final   C Diff interpretation No C. difficile detected.  Final  MRSA PCR Screening     Status: None   Collection Time: 06/28/17  8:00 PM  Result Value Ref Range Status   MRSA by PCR NEGATIVE NEGATIVE Final    Comment:        The GeneXpert MRSA Assay (FDA approved for NASAL specimens only), is one component of Alexica Schlossberg comprehensive MRSA colonization surveillance program. It is not intended to diagnose MRSA infection nor to guide or monitor treatment for MRSA infections.      Labs: Basic Metabolic Panel: Recent Labs  Lab 07/04/17 0510 07/05/17 0501 07/06/17 0420 07/07/17 0452 07/08/17 0450  NA 140 141 142 142 142  K 2.9* 3.2* 3.5 3.7 3.8  CL 118* 119* 119* 118* 119*  CO2 17* 18* 18* 19* 19*  GLUCOSE 106* 116* 118* 106* 107*  BUN 8 9 8 7 9   CREATININE 1.01* 1.03* 0.98 0.95 1.05*  CALCIUM 8.3* 8.6* 8.7* 8.5* 8.6*  MG 1.9 2.0 2.0 1.9 1.9   Liver Function Tests: No results for input(s): AST, ALT, ALKPHOS, BILITOT, PROT, ALBUMIN in the last 168 hours. No results for input(s): LIPASE, AMYLASE in the last 168 hours. No results for input(s): AMMONIA in the last 168 hours. CBC: Recent Labs  Lab 07/05/17 0501 07/06/17 0420  07/08/17 0450 07/08/17 1149  WBC 4.8 4.4 3.5*  --   HGB 9.3* 9.4* 8.8* 9.1*  HCT 27.2* 27.8* 26.3* 27.2*  MCV 101.5* 101.8* 103.5*  --   PLT 117* 136* 135*  --    Cardiac Enzymes: No results for input(s): CKTOTAL, CKMB, CKMBINDEX, TROPONINI in the last 168 hours. BNP: BNP (last 3 results) Recent Labs    05/09/17 1706  BNP 429.7*    ProBNP (last 3 results) No results for input(s): PROBNP in the last 8760 hours.  CBG: No results for input(s): GLUCAP in the last 168 hours.     Signed:  Fayrene Helper MD.  [facility] 07/08/2017, 2:10 PM

## 2017-07-08 NOTE — Progress Notes (Signed)
Last night, patient had a 2.39 second pause. It is saved in her telemetry strips. Patient was asleep.

## 2017-07-08 NOTE — Progress Notes (Signed)
Pt discharged and will transport to readmit at [facility].  Provided pt's DC information via the [facility].  Spoke with daughter [name] - aware and agreeable to plan. Also left voicemail for son [name] as previous CSW noted son would like call at DC.  Will arrange [facility] transportation once facility advises paperwork received and they are prepared for pt's return.  Sharren Bridge, MSW, LCSW Clinical Social Work 07/08/2017 [phone] weekend coverage for [phone]

## 2017-07-08 NOTE — Progress Notes (Signed)
[facility] Progress Note    Since last GI note: KUB yesterday much improved gas pattern (essentially normal).  She is tolerating full liquids without naueas, vomiting, pains.  Wants to advance diet.  Large BM today, semi formed, brown.  Objective: Vital signs in last 24 hours: Temp:  [98.4 F (36.9 C)-98.5 F (36.9 C)] 98.4 F (36.9 C) (02/03 0450) Pulse Rate:  [71-77] 71 (02/03 0450) Resp:  [16-20] 16 (02/03 0450) BP: (129-139)/(66-75) 129/66 (02/03 0450) SpO2:  [94 %-97 %] 97 % (02/03 0450) Last BM Date: 07/06/17 General: alert and oriented times 1 Heart: regular rate and rythm Abdomen: soft, non-tender, non-distended, normal bowel sounds   Lab Results: Recent Labs    07/06/17 0420 07/08/17 0450  WBC 4.4 3.5*  HGB 9.4* 8.8*  PLT 136* 135*  MCV 101.8* 103.5*   Recent Labs    07/06/17 0420 07/07/17 0452 07/08/17 0450  NA 142 142 142  K 3.5 3.7 3.8  CL 119* 118* 119*  CO2 18* 19* 19*  GLUCOSE 118* 106* 107*  BUN 8 7 9   CREATININE 0.98 0.95 1.05*  CALCIUM 8.7* 8.5* 8.6*    Studies/Results: Dg Abd 2 Views  Result Date: 07/07/2017 CLINICAL DATA:  Left-sided abdominal, hip and back pain. EXAM: ABDOMEN - 2 VIEW COMPARISON:  07/05/2017 FINDINGS: Nonobstructive bowel gas pattern. Prominence of the colon, improved from the prior radiograph. Moderate stool burden. Postsurgical changes in the right upper quadrant. Vascular calcifications noted. Large bore catheter overlies the pelvis. Degenerative without evidence of acute fractures. Post total left hip arthroplasty with chronic pelvis deformity. IMPRESSION: Nonobstructive bowel gas pattern. Improved prominence of the colon. Electronically Signed   By: Fidela Salisbury M.D.   On: 07/07/2017 10:07     Medications: Scheduled Meds: . amLODipine  2.5 mg Oral Daily  . aspirin-acetaminophen-caffeine  1 tablet Oral Daily  . baclofen  5 mg Oral QHS  . cyanocobalamin  500 mcg Oral QPM  . diclofenac sodium  2  g Topical QID  . dorzolamide  1 drop Both Eyes TID  . escitalopram  10 mg Oral Daily  . famotidine  20 mg Oral QHS  . fluticasone  2 spray Each Nare QHS  . gabapentin  100 mg Oral QHS  . latanoprost  1 drop Both Eyes QHS  . lidocaine  1 patch Transdermal Q24H  . loratadine  10 mg Oral QAC breakfast  . ondansetron (ZOFRAN) IV  4 mg Intravenous Q8H   Or  . ondansetron  4 mg Oral Q8H  . polyethylene glycol  17 g Oral BID   Continuous Infusions: . sodium chloride 50 mL/hr at 07/06/17 1906   PRN Meds:.acetaminophen, cyclobenzaprine, guaiFENesin-dextromethorphan    Assessment/Plan: 84 y.o. female with h/o recurrent volvulus, now with ileus vs. pseudoobstruction  Improved signficantly.  I removed her rectal tube this morning. OK to advance diet to heart healthy and d/c when ready.  Need to continue to maximize her mobility: OOB as much as possible, turning on right side, left side every 1-2 hours .   Please call or page with any further questions or concerns.     Milus Banister, MD  07/08/2017, 8:44 AM  Gastroenterology Pager [phone]

## 2017-07-09 ENCOUNTER — Telehealth: Payer: Self-pay

## 2017-07-09 NOTE — Telephone Encounter (Signed)
Possible re-admission to facility. This is a patient you were seeing at [facility] F/U is needed if patient was re-admitted to facility upon discharge. Hospital discharge from [facility] on 07/08/17.

## 2017-07-10 DIAGNOSIS — D649 Anemia, unspecified: Secondary | ICD-10-CM | POA: Diagnosis not present

## 2017-07-10 DIAGNOSIS — I10 Essential (primary) hypertension: Secondary | ICD-10-CM | POA: Diagnosis not present

## 2017-07-10 LAB — CBC AND DIFFERENTIAL
HCT: 31 — AB (ref 36–46)
HEMOGLOBIN: 9.9 — AB (ref 12.0–16.0)
NEUTROS ABS: 2
Platelets: 144 — AB (ref 150–399)
WBC: 4.1

## 2017-07-10 LAB — HEPATIC FUNCTION PANEL
ALK PHOS: 95 (ref 25–125)
ALT: 6 — AB (ref 7–35)
AST: 13 (ref 13–35)
Bilirubin, Total: 0.2

## 2017-07-10 LAB — BASIC METABOLIC PANEL
BUN: 11 (ref 4–21)
CREATININE: 1.1 (ref 0.5–1.1)
Glucose: 103
Potassium: 4 (ref 3.4–5.3)
SODIUM: 144 (ref 137–147)

## 2017-07-12 ENCOUNTER — Encounter: Payer: Self-pay | Admitting: Internal Medicine

## 2017-07-12 ENCOUNTER — Non-Acute Institutional Stay (SKILLED_NURSING_FACILITY): Payer: Medicare Other | Admitting: Internal Medicine

## 2017-07-12 DIAGNOSIS — I5032 Chronic diastolic (congestive) heart failure: Secondary | ICD-10-CM | POA: Diagnosis not present

## 2017-07-12 DIAGNOSIS — N183 Chronic kidney disease, stage 3 unspecified: Secondary | ICD-10-CM

## 2017-07-12 DIAGNOSIS — F4320 Adjustment disorder, unspecified: Secondary | ICD-10-CM

## 2017-07-12 DIAGNOSIS — F4321 Adjustment disorder with depressed mood: Secondary | ICD-10-CM | POA: Diagnosis not present

## 2017-07-12 DIAGNOSIS — K562 Volvulus: Secondary | ICD-10-CM | POA: Diagnosis not present

## 2017-07-12 DIAGNOSIS — G8929 Other chronic pain: Secondary | ICD-10-CM

## 2017-07-12 DIAGNOSIS — Z79899 Other long term (current) drug therapy: Secondary | ICD-10-CM

## 2017-07-12 DIAGNOSIS — M150 Primary generalized (osteo)arthritis: Secondary | ICD-10-CM

## 2017-07-12 DIAGNOSIS — N760 Acute vaginitis: Secondary | ICD-10-CM | POA: Diagnosis not present

## 2017-07-12 DIAGNOSIS — M159 Polyosteoarthritis, unspecified: Secondary | ICD-10-CM

## 2017-07-12 DIAGNOSIS — F0280 Dementia in other diseases classified elsewhere without behavioral disturbance: Secondary | ICD-10-CM | POA: Diagnosis not present

## 2017-07-12 DIAGNOSIS — D638 Anemia in other chronic diseases classified elsewhere: Secondary | ICD-10-CM | POA: Diagnosis not present

## 2017-07-12 NOTE — Progress Notes (Signed)
Patient ID: Maria Christian, female   DOB: 1924-11-26, 84 y.o.   MRN: [id number]  Provider:  DR Arletha Grippe Location:  [place] of Service:  SNF (31)  PCP: [name], DO Patient Care Team: [name], DO as PCP - [facility] ([facility], [place] [facility]  Extended Emergency Contact Information Primary Emergency Contact: [place] Phone: [phone] Relation: Son Secondary Emergency Contact: Roberts,Lynn Address: [address]          [place], [place] [address] [name] of [place] Phone: [phone] Mobile Phone: [phone] Relation: Daughter  Code Status: DNR Goals of Care: Advanced Directive information Advanced Directives 06/28/2017  Does Patient Have a Medical Advance Directive? Yes  Type of Advance Directive Out of facility DNR (pink MOST or yellow form)  Does patient want to make changes to medical advance directive? No - Patient declined  Copy of [place] in Chart? -  Would patient like information on creating a medical advance directive? -  Pre-existing out of facility DNR order (yellow form or pink MOST form) Yellow form placed in chart (order not valid for inpatient use)      Chief Complaint  Patient presents with  . Readmit To SNF    from hospital    HPI: Patient is a 84 y.o. female seen today for re-admission to SNF following hospital stay for sigmoid colon volvulus, diarrhea, UTI, acute on CKD, neck pain. CT abd/pelvis unchanged from previous imaging. GI consulted. Rectal tube inserted. UA abnormal and cx (+) multiple spp. She was tx with IV zosyn-->fosfomycin x3 doses. She was rx flexeril for neck pain. [name] neg for DVT in RLE. C diff toxin neg. Heme (+) stool. Cr 2.3-->1.05. She presents to SNF to continue long term care.  Today she reports feeling sad due to her next door neighbor's death. Abdomen still swollen but no pain.  BMs nml. Neck pain improved. She has intense discomfort in vaginal area and burning sensation over the last few days. No vaginal d/c but does have redness. Appetite is excellent and sleeps well. No nursing issues. She is a poor historian due to dementia. Hx obtained from chart  Hx Anemia - stable on iron daily. Hgb 9.5  PAF - rate controlled on coreg 3.125 mg daily; she takes ASA 81mg  daily .    Chronic respiratory failure - stable on prn [place] O2  Chronic pain syndrome 2/2 osteoarthritis and chronic HA - stable on baclofen 5 mg nightly for leg spasticity; neurontin 100 mg nightly for  neuropathic pain;  excederin migraine daily    Depression - mood stable on lexapro 10 mg daily   Chronic constipation/hx recurrent sigmoid volvulus - stable on colace 200 mg daily; linzess 145 mcg daily; senna twice daily; miralax daily.  Family has decided upon conservative treatment including repeat decompression by endoscope if needed.  Glaucoma - stable on trusopt to both eyes three times daily; travatan to both eyes nightly   CKD - stage 3. Cr 1.05  Hypokalemia - stable on Kdur 40 meq in the AM and 20 meq in the PM; Goal K 4.0 to prevent confusion. K 3.8  Hypertension - BP stable on norvasc 2.5 mg daily; coreg 3.125 mg daily   Chronic diastolic heart failure - stable on demadex 20 mg daily   Hx Duodenal ulcer/GERD - stable on prilosec 20 mg daily; zofran 8 mg every 8 hours as needed; pepcid 20  mg nightly  Allergic rhinitis - stable on flonase nightly and claritin 10 mg daily  Chronic/recurrent UTI - no sx's at this time. She takes AZO nightly and cranberry twice daily  Past Medical History:  Diagnosis Date  . Altered mental status   . Anemia   . Atrial fibrillation ([place])   . CKD (chronic kidney disease) stage 3, GFR 30-59 ml/min (HCC) 02/27/2015  . Constipation 02/27/2015  . Coronary artery disease   . Dementia   . Depression   . Diabetes mellitus   . Edema of lower extremity 07/13/11   right  leg more swollen than left leg  . Encephalopathy   . Enlarged heart   . Esophageal dysmotility 07/02/12  . GERD (gastroesophageal reflux disease)   . History of adenomatous polyp of colon 06/24/99  . Horseshoe kidney   . Hyperlipemia   . Hypertension   . Pancreatic lesion 05/22/11   no further workup  per PCP/family due to age  . Parkinson disease ([place])   . Peripheral neuropathy   . Sigmoid volvulus ([place]) 05/24/2013  . Thrombophlebitis   . TIA (transient ischemic attack) 12/19/2012   Past Surgical History:  Procedure Laterality Date  . ABDOMINAL HYSTERECTOMY    . BOWEL DECOMPRESSION N/A 04/18/2016   Procedure: BOWEL DECOMPRESSION;  Surgeon: [name], MD;  Location: [facility];  Service: Endoscopy;  Laterality: N/A;  . BREAST SURGERY     2 benign tumors removed left breast  . CHOLECYSTECTOMY    . ESOPHAGEAL DILATION     several times by Dr. [name]  . ESOPHAGOGASTRODUODENOSCOPY (EGD) WITH ESOPHAGEAL DILATION N/A 08/01/2012   Procedure: ESOPHAGOGASTRODUODENOSCOPY (EGD) WITH ESOPHAGEAL DILATION;  Surgeon: [name], MD;  Location: [facility];  Service: Endoscopy;  Laterality: N/A;  with c-arm savory dilators  . ESOPHAGOGASTRODUODENOSCOPY (EGD) WITH PROPOFOL N/A 03/12/2015   Procedure: ESOPHAGOGASTRODUODENOSCOPY (EGD) WITH PROPOFOL;  Surgeon: [name], MD;  Location: [facility];  Service: Endoscopy;  Laterality: N/A;  . FLEXIBLE SIGMOIDOSCOPY N/A 05/24/2013   Procedure: FLEXIBLE SIGMOIDOSCOPY;  Surgeon: [name], MD;  Location: [facility];  Service: Endoscopy;  Laterality: N/A;  . FLEXIBLE SIGMOIDOSCOPY N/A 05/26/2013   Procedure:  flex with decompression of sigmoid volvulus;  Surgeon: [name], MD;  Location: [facility];  Service: Endoscopy;  Laterality: N/A;  . FLEXIBLE SIGMOIDOSCOPY N/A 12/20/2015   Procedure: FLEXIBLE SIGMOIDOSCOPY;  Surgeon: Milus Banister, MD;  Location: [facility];  Service: Endoscopy;  Laterality: N/A;  . FLEXIBLE  SIGMOIDOSCOPY N/A 04/17/2016   Procedure: FLEXIBLE SIGMOIDOSCOPY;  Surgeon: Ladene Artist, MD;  Location: [facility];  Service: Endoscopy;  Laterality: N/A;  . FLEXIBLE SIGMOIDOSCOPY N/A 04/18/2016   Procedure: FLEXIBLE SIGMOIDOSCOPY;  Surgeon: [name], MD;  Location: [facility];  Service: Endoscopy;  Laterality: N/A;  . FOOT SURGERY     benign tumors from foot  . NOSE SURGERY    . SHOULDER SURGERY  2001   left clavicle excision and acromioplasty  . TOTAL HIP ARTHROPLASTY      reports that  has never smoked. she has never used smokeless tobacco. She reports that she does not drink alcohol or use drugs. Social History   Socioeconomic History  . Marital status: Widowed    Spouse name: Not on file  . Number of children: 5  . Years of education: 10th  . Highest education level: Not on file  Social Needs  . Financial resource strain: Not on file  . Food insecurity - worry:  Not on file  . Food insecurity - inability: Not on file  . Transportation needs - medical: Not on file  . Transportation needs - non-medical: Not on file  Occupational History  . Occupation: Retired  Tobacco Use  . Smoking status: Never Smoker  . Smokeless tobacco: Never Used  Substance and Sexual Activity  . Alcohol use: No  . Drug use: No  . Sexual activity: No  Other Topics Concern  . Not on file  Social History Narrative   Pt lives at [place]    Caffeine Use: very small amount daily    Functional Status Survey:    Family History  Problem Relation Age of Onset  . Cancer Unknown   . Heart disease Unknown     Health Maintenance  Topic Date Due  . URINE MICROALBUMIN  08/10/2017 (Originally 10/08/1934)  . HEMOGLOBIN A1C  10/17/2017 (Originally 10/16/2016)  . FOOT EXAM  01/10/2018 (Originally 01/06/2017)  . PNA vac Low Risk Adult (1 of 2 - PCV13) 11/18/2021 (Originally 10/09/1991)  . TETANUS/TDAP  04/06/2038 (Originally 10/08/1945)  . OPHTHALMOLOGY EXAM  09/18/2017  . INFLUENZA VACCINE   Completed  . DEXA SCAN  Discontinued    Allergies  Allergen Reactions  . Aspirin Other (See Comments)    [name]    Outpatient Encounter Medications as of 07/12/2017  Medication Sig  . acetaminophen (TYLENOL) 325 MG tablet Take 650 mg by mouth daily as needed (OSTEOARTHRITIS).  Marland Kitchen amLODipine (NORVASC) 2.5 MG tablet Take 2.5 mg by mouth every morning. Hold for BP < 100/60 or HR < 60  . baclofen (LIORESAL) 10 MG tablet Take 5 mg by mouth at bedtime.  . benzocaine-menthol (CHLORASEPTIC) 6-10 MG lozenge Take 1 lozenge by mouth as needed for sore throat.  . calcium carbonate (TUMS - DOSED IN MG ELEMENTAL CALCIUM) 500 MG chewable tablet Chew 2 tablets by mouth 3 (three) times daily.  . Cranberry 475 MG CAPS Take 1 capsule by mouth 2 (two) times daily.   . diclofenac sodium (VOLTAREN) 1 % GEL Apply 2 grams to neck topically three times a day for pain  . docusate sodium (COLACE) 100 MG capsule Take 200 mg by mouth at bedtime.   . dorzolamide (TRUSOPT) 2 % ophthalmic solution Place 1 drop into both eyes 3 (three) times daily.  Marland Kitchen escitalopram (LEXAPRO) 10 MG tablet Take 10 mg by mouth daily.  . famotidine (PEPCID) 20 MG tablet Take 20 mg by mouth at bedtime.  . ferrous sulfate 220 (44 Fe) MG/5ML solution Give 5 ml by mouth in the evening for supplement  . fluticasone (FLONASE) 50 MCG/ACT nasal spray Place 2 sprays into both nostrils at bedtime.  . gabapentin (NEURONTIN) 100 MG capsule Take 100 mg by mouth at bedtime.  . Linaclotide (LINZESS) 145 MCG CAPS capsule Take 145 mcg by mouth daily before breakfast.   . loratadine (CLARITIN) 10 MG tablet Take 10 mg by mouth daily before breakfast.   . Multiple Vitamins-Minerals (CERTAGEN PO) Take 1 tablet by mouth every evening.   . Multiple Vitamins-Minerals (HAIR SKIN AND NAILS FORMULA) TABS Take by mouth. Give 2 tablets by mouth daily  . nystatin cream (MYCOSTATIN) Apply to buttocks topically two times a day for redness  . omeprazole (PRILOSEC)  20 MG capsule Take 20 mg by mouth daily.  . ondansetron (ZOFRAN) 8 MG tablet Take 8 mg by mouth every 8 (eight) hours as needed for nausea or vomiting.  Marland Kitchen Phenazopyridine HCl (AZO TABS PO) Take 1 tablet  by mouth at bedtime.  . polyethylene glycol (MIRALAX / GLYCOLAX) packet Take 17 g by mouth daily.   . potassium chloride SA (K-DUR,KLOR-CON) 20 MEQ tablet Take 20-40 mEq by mouth 2 (two) times daily. Give 40 meq (2 tablets) in the morning and Give 20 meq (1 tablet) by mouth in the afternoon.  . senna (SENOKOT) 8.6 MG TABS tablet Take 1 tablet (8.6 mg total) by mouth 2 (two) times daily.  . sodium chloride (OCEAN) 0.65 % SOLN nasal spray Place 1 spray into both nostrils as directed. Four times daily And every 24 hours as needed to moisturize nasal passages.  . torsemide (DEMADEX) 20 MG tablet Take 20 mg by mouth daily.  . Travoprost, BAK Free, (TRAVATAN) 0.004 % SOLN ophthalmic solution Place 1 drop into both eyes daily at 8 pm.   . vitamin B-12 (CYANOCOBALAMIN) 500 MCG tablet Take 500 mcg by mouth every evening.    No facility-administered encounter medications on file as of 07/12/2017.     Review of Systems  Unable to perform ROS: Dementia    Vitals:   07/12/17 1601  BP: (!) 159/71  Pulse: 96  Temp: (!) 97.1 F (36.2 C)  SpO2: 96%   There is no height or weight on file to calculate BMI. Physical Exam  Constitutional: She appears well-developed and well-nourished.  Sitting in w/c in NAD  HENT:  Mouth/Throat: Oropharynx is clear and moist. No oropharyngeal exudate.  MMM; no oral thrush  Eyes: Pupils are equal, round, and reactive to light. No scleral icterus.  Neck: Neck supple. Muscular tenderness present. Carotid bruit is not present. Decreased range of motion present. No tracheal deviation present. No thyromegaly present.    Cardiovascular: Normal rate, regular rhythm and intact distal pulses. Exam reveals no gallop and no friction rub.  Murmur (1/6 SEM) heard. No LE edema b/l.  no calf TTP.   Pulmonary/Chest: Effort normal and breath sounds normal. No stridor. No respiratory distress. She has no wheezes. She has no rales.  Abdominal: Soft. Normal appearance and bowel sounds are normal. She exhibits distension. She exhibits no mass. There is no hepatomegaly. There is no tenderness. There is no rigidity, no rebound and no guarding. No hernia.  Musculoskeletal: She exhibits edema and tenderness.  Lymphadenopathy:    She has no cervical adenopathy.  Neurological: She is alert.  Skin: Skin is warm and dry. No rash noted.  Psychiatric: She has a normal mood and affect. Her behavior is normal. Thought content normal.    Labs reviewed: Basic Metabolic Panel: Recent Labs    07/06/17 0420 07/07/17 0452 07/08/17 0450  NA 142 142 142  K 3.5 3.7 3.8  CL 119* 118* 119*  CO2 18* 19* 19*  GLUCOSE 118* 106* 107*  BUN 8 7 9   CREATININE 0.98 0.95 1.05*  CALCIUM 8.7* 8.5* 8.6*  MG 2.0 1.9 1.9   Liver Function Tests: Recent Labs    05/09/17 1705 06/28/17 1213 06/29/17 0425  AST 19 28 22   ALT 11* 14 13*  ALKPHOS 93 90 79  BILITOT 0.6 0.5 0.4  PROT 6.9 7.3 6.6  ALBUMIN 3.6 3.6 3.2*   Recent Labs    05/09/17 1705  LIPASE 24   No results for input(s): AMMONIA in the last 8760 hours. CBC: Recent Labs    05/22/17 05/29/17 06/28/17 1213  07/05/17 0501 07/06/17 0420 07/08/17 0450 07/08/17 1149  WBC 3.4 4.5 4.1   < > 4.8 4.4 3.5*  --   NEUTROABS 1 2  1.8  --   --   --   --   --   HGB 9.3* 9.5* 10.4*   < > 9.3* 9.4* 8.8* 9.1*  HCT 28* 28* 31.7*   < > 27.2* 27.8* 26.3* 27.2*  MCV  --   --  106.0*   < > 101.5* 101.8* 103.5*  --   PLT 101* 90* 88*   < > 117* 136* 135*  --    < > = values in this interval not displayed.   Cardiac Enzymes: No results for input(s): CKTOTAL, CKMB, CKMBINDEX, TROPONINI in the last 8760 hours. BNP: Invalid input(s): POCBNP Lab Results  Component Value Date   HGBA1C 6.9 (H) 04/18/2016   Lab Results  Component Value Date   TSH  2.572 04/18/2016   Lab Results  Component Value Date   DVVOHYWV37 106 02/21/2017   Lab Results  Component Value Date   FOLATE 16.8 10/17/2013   Lab Results  Component Value Date   IRON 109 04/19/2015   TIBC 185 (L) 10/17/2013   FERRITIN 34.0 04/19/2015    Imaging and Procedures obtained prior to SNF admission: Ct Abdomen Pelvis Wo Contrast  Result Date: 06/28/2017 CLINICAL DATA:  Abdominal pain. Plain film of the abdomen worrisome for small bowel obstruction. EXAM: CT ABDOMEN AND PELVIS WITHOUT CONTRAST TECHNIQUE: Multidetector CT imaging of the abdomen and pelvis was performed following the standard protocol without IV contrast. COMPARISON:  CT abdomen and pelvis 05/09/2017 and 04/17/2016. FINDINGS: Lower chest: There is marked cardiomegaly. No pericardial or pleural effusion. Mild dependent atelectasis in the lung bases. Hepatobiliary: No focal liver abnormality is seen. Status post cholecystectomy. No biliary dilatation. Pancreas: Unremarkable. No pancreatic ductal dilatation or surrounding inflammatory changes. Spleen: Normal in size without focal abnormality. Adrenals/Urinary Tract: Adrenal glands appear normal. Horseshoe kidney is noted. Vascular calcifications are present and make it difficult to exclude small nonobstructing stones. No hydronephrosis. Urinary bladder is unremarkable. Stomach/Bowel: The sigmoid colon is markedly distended with an air-fluid level present but no volvulus is identified. There is no evidence of small bowel obstruction. The appendix appears normal. The stomach is unremarkable. Vascular/Lymphatic: Extensive aortoiliac atherosclerosis without aneurysm. No lymphadenopathy. Reproductive: Status post hysterectomy. No adnexal masses. Other: No fluid collection. Musculoskeletal: No acute bony abnormality. Left hip replacement is in place. IMPRESSION: Negative for small bowel obstruction. Marked gaseous distention of a redundant sigmoid colon is unchanged since the most  recent examination and likely related to functional abnormality. No volvulus is seen. Extensive atherosclerosis. Marked cardiomegaly. Horseshoe kidney. Extensive vascular calcifications make it difficult to exclude small nonobstructing stones in the kidney but there is no hydronephrosis. Electronically Signed   By: Inge Rise M.D.   On: 06/28/2017 14:14   Dg Chest 2 View  Result Date: 06/28/2017 CLINICAL DATA:  84 year old female with chest pain and cough for 2 days. Abdominal distension. EXAM: CHEST  2 VIEW COMPARISON:  CT Abdomen and Pelvis 05/09/2017 and earlier. FINDINGS: Semi upright AP and lateral views chest. Stable cardiomegaly and mediastinal contours. Calcified aortic atherosclerosis. Stable lung volumes. No pneumothorax or pneumoperitoneum identified. No pulmonary edema, pleural effusion or confluent pulmonary opacity. Partially visible gas distended bowel loops in the abdomen. No acute osseous abnormality identified. Chronic deformity distal right clavicle is stable. IMPRESSION: 1. No acute cardiopulmonary abnormality. Stable cardiomegaly. Aortic Atherosclerosis (ICD10-I70.0). 2. Partially visible gas distended bowel loops in the abdomen. Electronically Signed   By: Genevie Ann M.D.   On: 06/28/2017 12:47   Dg Abdomen 1 View  Result Date: 06/28/2017 CLINICAL DATA:  Chest pain and cough over the last 2 days. EXAM: ABDOMEN - 1 VIEW COMPARISON:  04/19/2016, multiple prior studies as distant as 10/14/2013 FINDINGS: This patient has multiple previous exams showing a pseudo obstruction pattern with gaseous distention of the colon. True obstruction is not suspected based on multiple similar exams. No sign of any enteral tube. Clips in the right upper quadrant consistent with previous cholecystectomy. Curvature in degenerative change of the spine. Previous left hip replacement. Aortic atherosclerosis. IMPRESSION: Gaseous distention of the colon, seen on multiple previous exams, consistent with pseudo  obstruction of the elderly. Electronically Signed   By: Nelson Chimes M.D.   On: 06/28/2017 13:07    Assessment/Plan   ICD-10-CM   1. Acute vaginitis N76.0    likely candida 2/2 recent abx use  2. Grief reaction F43.21   3. Primary osteoarthritis involving multiple joints M15.0   4. Volvulus of sigmoid colon ([place]) K56.2    currently asymptomatic  5. Dementia associated with other underlying disease without behavioral disturbance F02.80   6. Other chronic pain G89.29   7. CKD (chronic kidney disease) stage 3, GFR 30-59 ml/min (HCC) N18.3   8. Diastolic CHF, chronic (HCC) I50.32   9. Anemia of chronic disease D63.8   10. High risk medication use Z79.899     START DIFLUCAN 150MG  po today and repeat in 1 week for vaginitis  T/c NaHCO3 for metabolic acidosis  Cont other meds as ordered  PT/OT/ST as indicated  [facility] NP to follow  Family/ staff Communication: continue long term care  Labs/tests ordered: cbc and bmp    Jevon Shells S. Perlie Gold  [facility] [address] [place], [place] [address] [phone] Cell (Monday-Friday 8 AM - 5 PM) [phone] After 5 PM and follow prompts

## 2017-08-13 ENCOUNTER — Encounter: Payer: Self-pay | Admitting: Internal Medicine

## 2017-08-13 ENCOUNTER — Non-Acute Institutional Stay (SKILLED_NURSING_FACILITY): Payer: Medicare Other | Admitting: Internal Medicine

## 2017-08-13 DIAGNOSIS — I5032 Chronic diastolic (congestive) heart failure: Secondary | ICD-10-CM | POA: Diagnosis not present

## 2017-08-13 DIAGNOSIS — F0280 Dementia in other diseases classified elsewhere without behavioral disturbance: Secondary | ICD-10-CM

## 2017-08-13 DIAGNOSIS — I480 Paroxysmal atrial fibrillation: Secondary | ICD-10-CM

## 2017-08-13 DIAGNOSIS — F325 Major depressive disorder, single episode, in full remission: Secondary | ICD-10-CM | POA: Diagnosis not present

## 2017-08-13 DIAGNOSIS — Z79899 Other long term (current) drug therapy: Secondary | ICD-10-CM

## 2017-08-13 DIAGNOSIS — M159 Polyosteoarthritis, unspecified: Secondary | ICD-10-CM

## 2017-08-13 DIAGNOSIS — M150 Primary generalized (osteo)arthritis: Secondary | ICD-10-CM | POA: Diagnosis not present

## 2017-08-13 LAB — MICROALBUMIN, URINE: Microalb, Ur: 5.5

## 2017-08-13 NOTE — Progress Notes (Signed)
Patient ID: Maria Christian, female   DOB: 02/28/1925, 84 y.o.   MRN: [id number]   Location:  [facility] Room Number: 220 A Place of Service:  SNF (31) Provider:  Br[place] West, [place], DO  Patient Care Team: [name], DO as PCP - [facility] (Internal [facility]) [facility], [place] ([place])  Extended Emergency Contact Information Primary Emergency Contact: Ali Chuk of [place] Phone: [phone] Relation: Son Secondary Emergency Contact: Roberts,Lynn Address: [address]          [place], [place] [address] [name] of [place] Phone: [phone] Mobile Phone: [phone] Relation: Daughter  Code Status:  DNR Goals of care: Advanced Directive information Advanced Directives 08/13/2017  Does Patient Have a Medical Advance Directive? Yes  Type of Advance Directive Out of facility DNR (pink MOST or yellow form)  Does patient want to make changes to medical advance directive? No - Patient declined  Copy of [place] in Chart? -  Would patient like information on creating a medical advance directive? -  Pre-existing out of facility DNR order (yellow form or pink MOST form) -     Chief Complaint  Patient presents with  . Medical Management of Chronic Issues    [facility]    HPI:  Pt is a 84 y.o. female seen today for medical management of chronic diseases.  She c/o neck pain today. Her son, [name], has scheduled her an appt to see a "specialist" for the pain. Appetite reduced. Sleeps interrupted. She is a poor historian due to dementia. Hx obtained from chart  Anemia of chronic disease - stable on iron daily; Hgb 9.9  PAF - rate controlled on coreg 3.125 mg daily; takes ASA 81mg  daily .    Chronic respiratory failure - stable on [place] O2  Chronic pain syndrome due to osteoarthritis/chronic headaches - stable on baclofen 5 mg nightly for leg spasticity; neurontin 100 mg  nightly for neuropathic pain; excederin migraine daily    Depression: - mood stable on lexapro 10 mg daily. She does benefit from this regimen   Chronic constipation/hx recurrent sigmoid volvulus - BMs stable on colace 200 mg daily; linzess 145 mcg daily; senna twice daily; miralax daily.  Family has decided upon conservative treatment including repeat decompression by endoscope if needed for volvulus. (+) nausea but no emesis  Glaucoma - stable on trusopt to both eyes three times daily; travatan to both eyes nightly   CKD - hx stage 3. Cr 1.1  Hypokalemia - stable on k+ 40 meq in the AM and 20 meq in the PM. Her son has been told in the past by doctors that she needs to keep her K level at least 4.0. If her k+ is less than 4 she could become confused. Current K 4.0   Hypertension - BP stable on norvasc 2.5 mg daily; coreg 3.15 mg daily   Chronic diastolic heart failure - stable on demadex 20 mg daily with KCl daily  Hx Duodenal ulcer - stable on prilosec 20 mg daily and pepcid qhs;  zofran 8 mg every 8 hours as needed   Allergic rhinitis - stable on flonase nightly and claritin 10 mg daily  recurrent UTI - stable on  AZO nightly and cranberry twice daily   Hx DM - diet controlled. A1c 6.2%  Past Medical History:  Diagnosis Date  . Altered mental status   . Anemia   . Atrial fibrillation ([place])   .  CKD (chronic kidney disease) stage 3, GFR 30-59 ml/min (HCC) 02/27/2015  . Constipation 02/27/2015  . Coronary artery disease   . Dementia   . Depression   . Diabetes mellitus   . Edema of lower extremity 07/13/11   right leg more swollen than left leg  . Encephalopathy   . Enlarged heart   . Esophageal dysmotility 07/02/12  . GERD (gastroesophageal reflux disease)   . History of adenomatous polyp of colon 06/24/99  . Horseshoe kidney   . Hyperlipemia   . Hypertension   . Pancreatic lesion 05/22/11   no further workup  per PCP/family due to age  . Parkinson disease ([place])   .  Peripheral neuropathy   . Sigmoid volvulus ([place]) 05/24/2013  . Thrombophlebitis   . TIA (transient ischemic attack) 12/19/2012   Past Surgical History:  Procedure Laterality Date  . ABDOMINAL HYSTERECTOMY    . BOWEL DECOMPRESSION N/A 04/18/2016   Procedure: BOWEL DECOMPRESSION;  Surgeon: [name], MD;  Location: [facility];  Service: Endoscopy;  Laterality: N/A;  . BREAST SURGERY     2 benign tumors removed left breast  . CHOLECYSTECTOMY    . ESOPHAGEAL DILATION     several times by Dr. [name]  . ESOPHAGOGASTRODUODENOSCOPY (EGD) WITH ESOPHAGEAL DILATION N/A 08/01/2012   Procedure: ESOPHAGOGASTRODUODENOSCOPY (EGD) WITH ESOPHAGEAL DILATION;  Surgeon: [name], MD;  Location: [facility];  Service: Endoscopy;  Laterality: N/A;  with c-arm savory dilators  . ESOPHAGOGASTRODUODENOSCOPY (EGD) WITH PROPOFOL N/A 03/12/2015   Procedure: ESOPHAGOGASTRODUODENOSCOPY (EGD) WITH PROPOFOL;  Surgeon: [name], MD;  Location: [facility];  Service: Endoscopy;  Laterality: N/A;  . FLEXIBLE SIGMOIDOSCOPY N/A 05/24/2013   Procedure: FLEXIBLE SIGMOIDOSCOPY;  Surgeon: [name], MD;  Location: [facility];  Service: Endoscopy;  Laterality: N/A;  . FLEXIBLE SIGMOIDOSCOPY N/A 05/26/2013   Procedure:  flex with decompression of sigmoid volvulus;  Surgeon: [name], MD;  Location: [facility];  Service: Endoscopy;  Laterality: N/A;  . FLEXIBLE SIGMOIDOSCOPY N/A 12/20/2015   Procedure: FLEXIBLE SIGMOIDOSCOPY;  Surgeon: Milus Banister, MD;  Location: [facility];  Service: Endoscopy;  Laterality: N/A;  . FLEXIBLE SIGMOIDOSCOPY N/A 04/17/2016   Procedure: FLEXIBLE SIGMOIDOSCOPY;  Surgeon: Ladene Artist, MD;  Location: [facility];  Service: Endoscopy;  Laterality: N/A;  . FLEXIBLE SIGMOIDOSCOPY N/A 04/18/2016   Procedure: FLEXIBLE SIGMOIDOSCOPY;  Surgeon: [name], MD;  Location: [facility];  Service: Endoscopy;  Laterality: N/A;  . FOOT SURGERY     benign tumors from  foot  . NOSE SURGERY    . SHOULDER SURGERY  2001   left clavicle excision and acromioplasty  . TOTAL HIP ARTHROPLASTY      Allergies  Allergen Reactions  . Aspirin Other (See Comments)    G.I. Upset only    Outpatient Encounter Medications as of 08/13/2017  Medication Sig  . amLODipine (NORVASC) 2.5 MG tablet Take 2.5 mg by mouth every morning. Hold for BP < 100/60 or HR < 60  . aspirin-acetaminophen-caffeine (EXCEDRIN MIGRAINE) 250-250-65 MG tablet Take 2 tablets by mouth every 12 (twelve) hours as needed for migraine.  . baclofen (LIORESAL) 10 MG tablet Take 5 mg by mouth at bedtime.  . benzocaine-menthol (CHLORASEPTIC) 6-10 MG lozenge Take 1 lozenge by mouth as needed for sore throat.  . calcium carbonate (TUMS - DOSED IN MG ELEMENTAL CALCIUM) 500 MG chewable tablet Chew 2 tablets by mouth 3 (three) times daily.  . Cranberry 475 MG CAPS Take 1 capsule  by mouth 2 (two) times daily.   . diclofenac sodium (VOLTAREN) 1 % GEL Apply 2 grams to neck topically three times a day for pain  . docusate sodium (COLACE) 100 MG capsule Take 200 mg by mouth at bedtime.   . dorzolamide (TRUSOPT) 2 % ophthalmic solution Place 1 drop into both eyes 3 (three) times daily.  Marland Kitchen escitalopram (LEXAPRO) 10 MG tablet Take 10 mg by mouth daily.  . ferrous sulfate 220 (44 Fe) MG/5ML solution Give 5 ml by mouth in the evening for supplement  . fluticasone (FLONASE) 50 MCG/ACT nasal spray Place 2 sprays into both nostrils at bedtime.  . gabapentin (NEURONTIN) 100 MG capsule Take 100 mg by mouth at bedtime.  . Linaclotide (LINZESS) 145 MCG CAPS capsule Take 145 mcg by mouth daily before breakfast.   . loratadine (CLARITIN) 10 MG tablet Take 10 mg by mouth daily before breakfast.   . Multiple Vitamins-Minerals (CERTAGEN PO) Take 1 tablet by mouth every evening.   . Multiple Vitamins-Minerals (HAIR SKIN AND NAILS FORMULA) TABS Take by mouth. Give 2 tablets by mouth daily  . nystatin cream (MYCOSTATIN) Apply to  buttocks topically two times a day for redness  . omeprazole (PRILOSEC) 20 MG capsule Take 20 mg by mouth daily.  . ondansetron (ZOFRAN) 8 MG tablet Take 8 mg by mouth every 8 (eight) hours as needed for nausea or vomiting.  Marland Kitchen Phenazopyridine HCl (AZO TABS PO) Take 1 tablet by mouth at bedtime.  . polyethylene glycol (MIRALAX / GLYCOLAX) packet Take 17 g by mouth daily.   . potassium chloride SA (K-DUR,KLOR-CON) 20 MEQ tablet Take 20-40 mEq by mouth 2 (two) times daily. Give 40 meq (2 tablets) in the morning and Give 20 meq (1 tablet) by mouth in the afternoon.  . senna (SENOKOT) 8.6 MG TABS tablet Take 1 tablet (8.6 mg total) by mouth 2 (two) times daily.  . sodium chloride (OCEAN) 0.65 % SOLN nasal spray Place 1 spray into both nostrils as directed. Four times daily And every 24 hours as needed to moisturize nasal passages.  . torsemide (DEMADEX) 20 MG tablet Take 20 mg by mouth daily.  . Travoprost, BAK Free, (TRAVATAN) 0.004 % SOLN ophthalmic solution Place 1 drop into both eyes daily at 8 pm.   . vitamin B-12 (CYANOCOBALAMIN) 500 MCG tablet Take 500 mcg by mouth every evening.   . [DISCONTINUED] acetaminophen (TYLENOL) 325 MG tablet Take 650 mg by mouth daily as needed (OSTEOARTHRITIS).  . [DISCONTINUED] famotidine (PEPCID) 20 MG tablet Take 20 mg by mouth at bedtime.   No facility-administered encounter medications on file as of 08/13/2017.     Review of Systems  Unable to perform ROS: Dementia    Immunization History  Administered Date(s) Administered  . Influenza-Unspecified 07/13/2015, 03/09/2016, 04/02/2017  . PPD Test 07/28/2013, 02/18/2016, 02/25/2016   Pertinent  Health Maintenance Due  Topic Date Due  . HEMOGLOBIN A1C  10/17/2017 (Originally 10/16/2016)  . FOOT EXAM  01/10/2018 (Originally 01/06/2017)  . URINE MICROALBUMIN  08/14/2018 (Originally 10/08/1934)  . PNA vac Low Risk Adult (1 of 2 - PCV13) 11/18/2021 (Originally 10/09/1991)  . OPHTHALMOLOGY EXAM  09/18/2017  .  INFLUENZA VACCINE  Completed  . DEXA SCAN  Discontinued   Fall Risk  11/23/2016  Falls in the past year? No   Functional Status Survey:    Vitals:   08/13/17 0933  BP: 135/77  Pulse: 71  Resp: 18  Temp: (!) 97.5 F (36.4 C)  SpO2: 96%  Weight: 152 lb (68.9 kg)  Height: 5\' 7"  (1.702 m)   Body mass index is 23.81 kg/m. Physical Exam  Constitutional: She appears well-developed and well-nourished.  Frail appearing in NAD, sitting up in bed  HENT:  Mouth/Throat: Oropharynx is clear and moist. No oropharyngeal exudate.  MMM; no oral thrush  Eyes: Pupils are equal, round, and reactive to light. No scleral icterus.  Neck: Neck supple. Muscular tenderness present. No spinous process tenderness present. Carotid bruit is not present. Decreased range of motion present. No tracheal deviation present. No thyromegaly present.    Cardiovascular: Normal rate, regular rhythm and intact distal pulses. Exam reveals no gallop and no friction rub.  Murmur (1/6 SEM) heard. No LE edema b/l. no calf TTP.   Pulmonary/Chest: Effort normal and breath sounds normal. No stridor. No respiratory distress. She has no wheezes. She has no rales. She exhibits no tenderness.  Abdominal: Soft. Normal appearance and bowel sounds are normal. She exhibits distension. She exhibits no mass. There is no hepatomegaly. There is no tenderness. There is no rigidity, no rebound and no guarding. No hernia.  Musculoskeletal: She exhibits edema.  Lymphadenopathy:    She has no cervical adenopathy.  Neurological: She is alert.  Skin: Skin is warm and dry. No rash noted.  Psychiatric: She has a normal mood and affect. Her behavior is normal. Thought content normal.    Labs reviewed: Recent Labs    07/06/17 0420 07/07/17 0452 07/08/17 0450 07/10/17  NA 142 142 142 144  K 3.5 3.7 3.8 4.0  CL 119* 118* 119*  --   CO2 18* 19* 19*  --   GLUCOSE 118* 106* 107*  --   BUN 8 7 9 11   CREATININE 0.98 0.95 1.05* 1.1  CALCIUM  8.7* 8.5* 8.6*  --   MG 2.0 1.9 1.9  --    Recent Labs    05/09/17 1705 06/28/17 1213 06/29/17 0425 07/10/17  AST 19 28 22 13   ALT 11* 14 13* 6*  ALKPHOS 93 90 79 95  BILITOT 0.6 0.5 0.4  --   PROT 6.9 7.3 6.6  --   ALBUMIN 3.6 3.6 3.2*  --    Recent Labs    05/29/17 06/28/17 1213  07/05/17 0501 07/06/17 0420 07/08/17 0450 07/08/17 1149 07/10/17  WBC 4.5 4.1   < > 4.8 4.4 3.5*  --  4.1  NEUTROABS 2 1.8  --   --   --   --   --  2  HGB 9.5* 10.4*   < > 9.3* 9.4* 8.8* 9.1* 9.9*  HCT 28* 31.7*   < > 27.2* 27.8* 26.3* 27.2* 31*  MCV  --  106.0*   < > 101.5* 101.8* 103.5*  --   --   PLT 90* 88*   < > 117* 136* 135*  --  144*   < > = values in this interval not displayed.   Lab Results  Component Value Date   TSH 2.572 04/18/2016   Lab Results  Component Value Date   HGBA1C 6.9 (H) 04/18/2016   Lab Results  Component Value Date   CHOL 133 11/24/2016   HDL 41 11/24/2016   LDLCALC 78 11/24/2016   TRIG 75 11/24/2016   CHOLHDL 2.3 05/23/2011    Significant Diagnostic Results in last 30 days:  No results found.  Assessment/Plan   ICD-10-CM   1. Primary osteoarthritis involving multiple joints M15.0   2. High risk medication use Z79.899   3. Dementia  associated with other underlying disease without behavioral disturbance F02.80   4. PAF (paroxysmal atrial fibrillation) (HCC) I48.0   5. Diastolic CHF, chronic (HCC) I50.32   6. Major depressive disorder with single episode, in full remission ([place]) F32.5     Cont current meds as ordered  F/u with specialists as scheduled  PT/OT/ST as indicated  [facility] NP to follow  Will follow   Labs/tests ordered: urine microalbumin/Cr ratio   Yoskar Murrillo S. Perlie Gold  [facility] [address] [place], [place] [address] [phone] Cell (Monday-Friday 8 AM - 5 PM) [phone] After 5 PM and follow prompts

## 2017-08-28 LAB — BASIC METABOLIC PANEL
BUN: 23 — AB (ref 4–21)
CREATININE: 1.4 — AB (ref 0.5–1.1)
GLUCOSE: 227
Potassium: 4.4 (ref 3.4–5.3)
SODIUM: 141 (ref 137–147)

## 2017-08-28 LAB — CBC AND DIFFERENTIAL
HCT: 31 — AB (ref 36–46)
Hemoglobin: 10.6 — AB (ref 12.0–16.0)
NEUTROS ABS: 2
PLATELETS: 116 — AB (ref 150–399)
WBC: 5.1

## 2017-09-03 LAB — BASIC METABOLIC PANEL
BUN: 35 — AB (ref 4–21)
CREATININE: 1.8 — AB (ref 0.5–1.1)
GLUCOSE: 135
Potassium: 4.2 (ref 3.4–5.3)
SODIUM: 143 (ref 137–147)

## 2017-09-03 LAB — VITAMIN B12: Vitamin B-12: 659

## 2017-09-15 ENCOUNTER — Encounter (HOSPITAL_COMMUNITY)
Admission: EM | Disposition: A | Discharge status: Skilled Nursing Facility | Payer: Self-pay | Source: Home / Self Care | Attending: Internal Medicine

## 2017-09-15 ENCOUNTER — Encounter (HOSPITAL_COMMUNITY): Payer: Self-pay | Admitting: Nurse Practitioner

## 2017-09-15 ENCOUNTER — Encounter (HOSPITAL_COMMUNITY): Payer: Self-pay | Admitting: Gastroenterology

## 2017-09-15 ENCOUNTER — Encounter: Payer: Self-pay | Admitting: Internal Medicine

## 2017-09-15 ENCOUNTER — Emergency Department (HOSPITAL_COMMUNITY): Payer: Medicare Other

## 2017-09-15 ENCOUNTER — Inpatient Hospital Stay (HOSPITAL_COMMUNITY)
Admission: EM | Admit: 2017-09-15 | Discharge: 2017-10-06 | DRG: 329 | Disposition: A | Discharge status: Skilled Nursing Facility | Payer: Medicare Other | Attending: Internal Medicine | Admitting: Internal Medicine

## 2017-09-15 DIAGNOSIS — E1142 Type 2 diabetes mellitus with diabetic polyneuropathy: Secondary | ICD-10-CM

## 2017-09-15 DIAGNOSIS — Q631 Lobulated, fused and horseshoe kidney: Secondary | ICD-10-CM

## 2017-09-15 DIAGNOSIS — N183 Chronic kidney disease, stage 3 (moderate): Secondary | ICD-10-CM | POA: Diagnosis present

## 2017-09-15 DIAGNOSIS — I5032 Chronic diastolic (congestive) heart failure: Secondary | ICD-10-CM | POA: Diagnosis present

## 2017-09-15 DIAGNOSIS — K559 Vascular disorder of intestine, unspecified: Secondary | ICD-10-CM | POA: Diagnosis present

## 2017-09-15 DIAGNOSIS — F0281 Dementia in other diseases classified elsewhere with behavioral disturbance: Secondary | ICD-10-CM | POA: Diagnosis present

## 2017-09-15 DIAGNOSIS — I482 Chronic atrial fibrillation, unspecified: Secondary | ICD-10-CM | POA: Diagnosis present

## 2017-09-15 DIAGNOSIS — Z86010 Personal history of colonic polyps: Secondary | ICD-10-CM

## 2017-09-15 DIAGNOSIS — Z4659 Encounter for fitting and adjustment of other gastrointestinal appliance and device: Secondary | ICD-10-CM

## 2017-09-15 DIAGNOSIS — D696 Thrombocytopenia, unspecified: Secondary | ICD-10-CM | POA: Diagnosis present

## 2017-09-15 DIAGNOSIS — K562 Volvulus: Principal | ICD-10-CM | POA: Diagnosis present

## 2017-09-15 DIAGNOSIS — E876 Hypokalemia: Secondary | ICD-10-CM | POA: Diagnosis present

## 2017-09-15 DIAGNOSIS — R1031 Right lower quadrant pain: Secondary | ICD-10-CM | POA: Diagnosis not present

## 2017-09-15 DIAGNOSIS — Z09 Encounter for follow-up examination after completed treatment for conditions other than malignant neoplasm: Secondary | ICD-10-CM

## 2017-09-15 DIAGNOSIS — Z7951 Long term (current) use of inhaled steroids: Secondary | ICD-10-CM

## 2017-09-15 DIAGNOSIS — D539 Nutritional anemia, unspecified: Secondary | ICD-10-CM | POA: Diagnosis present

## 2017-09-15 DIAGNOSIS — K633 Ulcer of intestine: Secondary | ICD-10-CM | POA: Diagnosis present

## 2017-09-15 DIAGNOSIS — K5939 Other megacolon: Secondary | ICD-10-CM | POA: Diagnosis present

## 2017-09-15 DIAGNOSIS — K219 Gastro-esophageal reflux disease without esophagitis: Secondary | ICD-10-CM | POA: Diagnosis not present

## 2017-09-15 DIAGNOSIS — F0391 Unspecified dementia with behavioral disturbance: Secondary | ICD-10-CM

## 2017-09-15 DIAGNOSIS — I130 Hypertensive heart and chronic kidney disease with heart failure and stage 1 through stage 4 chronic kidney disease, or unspecified chronic kidney disease: Secondary | ICD-10-CM | POA: Diagnosis present

## 2017-09-15 DIAGNOSIS — N179 Acute kidney failure, unspecified: Secondary | ICD-10-CM | POA: Diagnosis not present

## 2017-09-15 DIAGNOSIS — K5732 Diverticulitis of large intestine without perforation or abscess without bleeding: Secondary | ICD-10-CM | POA: Diagnosis present

## 2017-09-15 DIAGNOSIS — F329 Major depressive disorder, single episode, unspecified: Secondary | ICD-10-CM | POA: Diagnosis present

## 2017-09-15 DIAGNOSIS — E785 Hyperlipidemia, unspecified: Secondary | ICD-10-CM | POA: Diagnosis present

## 2017-09-15 DIAGNOSIS — D649 Anemia, unspecified: Secondary | ICD-10-CM | POA: Diagnosis present

## 2017-09-15 DIAGNOSIS — Q438 Other specified congenital malformations of intestine: Secondary | ICD-10-CM

## 2017-09-15 DIAGNOSIS — E43 Unspecified severe protein-calorie malnutrition: Secondary | ICD-10-CM | POA: Diagnosis present

## 2017-09-15 DIAGNOSIS — E872 Acidosis: Secondary | ICD-10-CM | POA: Diagnosis present

## 2017-09-15 DIAGNOSIS — Z993 Dependence on wheelchair: Secondary | ICD-10-CM

## 2017-09-15 DIAGNOSIS — Z6826 Body mass index (BMI) 26.0-26.9, adult: Secondary | ICD-10-CM

## 2017-09-15 DIAGNOSIS — J9621 Acute and chronic respiratory failure with hypoxia: Secondary | ICD-10-CM | POA: Diagnosis present

## 2017-09-15 DIAGNOSIS — N189 Chronic kidney disease, unspecified: Secondary | ICD-10-CM | POA: Diagnosis not present

## 2017-09-15 DIAGNOSIS — G20 Parkinson's disease: Secondary | ICD-10-CM | POA: Diagnosis present

## 2017-09-15 DIAGNOSIS — Z7982 Long term (current) use of aspirin: Secondary | ICD-10-CM

## 2017-09-15 DIAGNOSIS — Z96642 Presence of left artificial hip joint: Secondary | ICD-10-CM | POA: Diagnosis present

## 2017-09-15 DIAGNOSIS — L89322 Pressure ulcer of left buttock, stage 2: Secondary | ICD-10-CM | POA: Diagnosis present

## 2017-09-15 DIAGNOSIS — Z886 Allergy status to analgesic agent status: Secondary | ICD-10-CM

## 2017-09-15 DIAGNOSIS — L8990 Pressure ulcer of unspecified site, unspecified stage: Secondary | ICD-10-CM

## 2017-09-15 DIAGNOSIS — E860 Dehydration: Secondary | ICD-10-CM | POA: Diagnosis present

## 2017-09-15 DIAGNOSIS — Z66 Do not resuscitate: Secondary | ICD-10-CM | POA: Diagnosis present

## 2017-09-15 DIAGNOSIS — F03918 Unspecified dementia, unspecified severity, with other behavioral disturbance: Secondary | ICD-10-CM

## 2017-09-15 DIAGNOSIS — Z8673 Personal history of transient ischemic attack (TIA), and cerebral infarction without residual deficits: Secondary | ICD-10-CM

## 2017-09-15 DIAGNOSIS — E1122 Type 2 diabetes mellitus with diabetic chronic kidney disease: Secondary | ICD-10-CM | POA: Diagnosis present

## 2017-09-15 DIAGNOSIS — I2510 Atherosclerotic heart disease of native coronary artery without angina pectoris: Secondary | ICD-10-CM | POA: Diagnosis present

## 2017-09-15 DIAGNOSIS — Z8672 Personal history of thrombophlebitis: Secondary | ICD-10-CM

## 2017-09-15 DIAGNOSIS — Z90710 Acquired absence of both cervix and uterus: Secondary | ICD-10-CM

## 2017-09-15 DIAGNOSIS — Z79899 Other long term (current) drug therapy: Secondary | ICD-10-CM

## 2017-09-15 DIAGNOSIS — G43909 Migraine, unspecified, not intractable, without status migrainosus: Secondary | ICD-10-CM | POA: Diagnosis present

## 2017-09-15 HISTORY — PX: FLEXIBLE SIGMOIDOSCOPY: SHX5431

## 2017-09-15 LAB — COMPREHENSIVE METABOLIC PANEL
ALBUMIN: 3.9 g/dL (ref 3.5–5.0)
ALK PHOS: 99 U/L (ref 38–126)
ALT: 12 U/L — AB (ref 14–54)
ANION GAP: 10 (ref 5–15)
AST: 19 U/L (ref 15–41)
BUN: 43 mg/dL — ABNORMAL HIGH (ref 6–20)
CALCIUM: 9.4 mg/dL (ref 8.9–10.3)
CO2: 21 mmol/L — AB (ref 22–32)
Chloride: 109 mmol/L (ref 101–111)
Creatinine, Ser: 2.17 mg/dL — ABNORMAL HIGH (ref 0.44–1.00)
GFR calc Af Amer: 22 mL/min — ABNORMAL LOW (ref >60)
GFR calc non Af Amer: 19 mL/min — ABNORMAL LOW (ref >60)
GLUCOSE: 175 mg/dL — AB (ref 65–99)
Potassium: 4 mmol/L (ref 3.5–5.1)
SODIUM: 140 mmol/L (ref 135–145)
Total Bilirubin: 0.8 mg/dL (ref 0.3–1.2)
Total Protein: 8.1 g/dL (ref 6.5–8.1)

## 2017-09-15 LAB — CBC WITH DIFFERENTIAL/PLATELET
BASOS PCT: 0 %
Basophils Absolute: 0 10*3/uL (ref 0.0–0.1)
EOS ABS: 0.1 10*3/uL (ref 0.0–0.7)
EOS PCT: 1 %
HEMATOCRIT: 33.7 % — AB (ref 36.0–46.0)
HEMOGLOBIN: 10.7 g/dL — AB (ref 12.0–15.0)
LYMPHS PCT: 15 %
Lymphs Abs: 1.2 10*3/uL (ref 0.7–4.0)
MCH: 33.8 pg (ref 26.0–34.0)
MCHC: 31.8 g/dL (ref 30.0–36.0)
MCV: 106.3 fL — AB (ref 78.0–100.0)
Monocytes Absolute: 0.7 10*3/uL (ref 0.1–1.0)
Monocytes Relative: 9 %
NEUTROS ABS: 5.8 10*3/uL (ref 1.7–7.7)
Neutrophils Relative %: 75 %
Platelets: 104 10*3/uL — ABNORMAL LOW (ref 150–400)
RBC: 3.17 MIL/uL — ABNORMAL LOW (ref 3.87–5.11)
RDW: 14.1 % (ref 11.5–15.5)
WBC MORPHOLOGY: INCREASED
WBC: 7.8 10*3/uL (ref 4.0–10.5)

## 2017-09-15 LAB — I-STAT CG4 LACTIC ACID, ED: Lactic Acid, Venous: 1.11 mmol/L (ref 0.5–1.9)

## 2017-09-15 LAB — I-STAT CHEM 8, ED
BUN: 38 mg/dL — AB (ref 6–20)
CHLORIDE: 109 mmol/L (ref 101–111)
CREATININE: 2.1 mg/dL — AB (ref 0.44–1.00)
Calcium, Ion: 1.2 mmol/L (ref 1.15–1.40)
GLUCOSE: 171 mg/dL — AB (ref 65–99)
HCT: 34 % — ABNORMAL LOW (ref 36.0–46.0)
Hemoglobin: 11.6 g/dL — ABNORMAL LOW (ref 12.0–15.0)
Potassium: 4 mmol/L (ref 3.5–5.1)
Sodium: 140 mmol/L (ref 135–145)
TCO2: 21 mmol/L — ABNORMAL LOW (ref 22–32)

## 2017-09-15 LAB — MRSA PCR SCREENING: MRSA by PCR: NEGATIVE

## 2017-09-15 SURGERY — SIGMOIDOSCOPY, FLEXIBLE
Anesthesia: Moderate Sedation

## 2017-09-15 MED ORDER — SODIUM CHLORIDE 0.9 % IV SOLN
INTRAVENOUS | Status: AC | PRN
  Administered 2017-09-15: 500 mL via INTRAVENOUS

## 2017-09-15 MED ORDER — ALBUTEROL SULFATE (2.5 MG/3ML) 0.083% IN NEBU: 2.5000 mg | INHALATION_SOLUTION | Freq: Four times a day (QID) | RESPIRATORY_TRACT | Status: DC | PRN

## 2017-09-15 MED ORDER — NYSTATIN 100000 UNIT/GM EX POWD
1.0000 g | Freq: Two times a day (BID) | CUTANEOUS | Status: DC
  Administered 2017-09-15 – 2017-09-26 (×23): 1 g via TOPICAL
  Filled 2017-09-15 (×2): qty 15

## 2017-09-15 MED ORDER — SODIUM CHLORIDE 0.9 % IV BOLUS
500.0000 mL | Freq: Once | INTRAVENOUS | Status: AC
  Administered 2017-09-15: 500 mL via INTRAVENOUS

## 2017-09-15 MED ORDER — LATANOPROST 0.005 % OP SOLN
1.0000 [drp] | Freq: Every day | OPHTHALMIC | Status: DC
  Administered 2017-09-15 – 2017-09-26 (×12): 1 [drp] via OPHTHALMIC
  Filled 2017-09-15 (×2): qty 2.5

## 2017-09-15 MED ORDER — MIDAZOLAM HCL 5 MG/ML IJ SOLN
INTRAMUSCULAR | Status: AC
  Filled 2017-09-15: qty 2

## 2017-09-15 MED ORDER — BENZOCAINE-MENTHOL 6-10 MG MT LOZG
1.0000 | LOZENGE | OROMUCOSAL | Status: DC | PRN
  Filled 2017-09-15: qty 18

## 2017-09-15 MED ORDER — ONDANSETRON HCL 4 MG/2ML IJ SOLN
4.0000 mg | Freq: Four times a day (QID) | INTRAMUSCULAR | Status: DC | PRN
  Administered 2017-09-23: 4 mg via INTRAVENOUS
  Filled 2017-09-15: qty 2

## 2017-09-15 MED ORDER — IPRATROPIUM-ALBUTEROL 0.5-2.5 (3) MG/3ML IN SOLN: 3.0000 mL | Freq: Four times a day (QID) | RESPIRATORY_TRACT | Status: DC | PRN

## 2017-09-15 MED ORDER — PANTOPRAZOLE SODIUM 40 MG IV SOLR
40.0000 mg | Freq: Two times a day (BID) | INTRAVENOUS | Status: DC
  Administered 2017-09-15 – 2017-09-17 (×4): 40 mg via INTRAVENOUS
  Filled 2017-09-15 (×4): qty 40

## 2017-09-15 MED ORDER — SALINE SPRAY 0.65 % NA SOLN
1.0000 | Freq: Four times a day (QID) | NASAL | Status: DC | PRN
  Filled 2017-09-15: qty 44

## 2017-09-15 MED ORDER — DORZOLAMIDE HCL 2 % OP SOLN
1.0000 [drp] | Freq: Three times a day (TID) | OPHTHALMIC | Status: DC
  Administered 2017-09-15 – 2017-09-26 (×33): 1 [drp] via OPHTHALMIC
  Filled 2017-09-15 (×2): qty 10

## 2017-09-15 MED ORDER — SODIUM CHLORIDE 0.9 % IV SOLN
INTRAVENOUS | Status: DC
  Administered 2017-09-15 – 2017-09-17 (×2): via INTRAVENOUS

## 2017-09-15 MED ORDER — ACETAMINOPHEN 650 MG RE SUPP: 650.0000 mg | Freq: Four times a day (QID) | RECTAL | Status: DC | PRN

## 2017-09-15 MED ORDER — ONDANSETRON HCL 4 MG PO TABS: 4.0000 mg | ORAL_TABLET | Freq: Four times a day (QID) | ORAL | Status: DC | PRN

## 2017-09-15 MED ORDER — LIDOCAINE VISCOUS 2 % MT SOLN
15.0000 mL | Freq: Once | OROMUCOSAL | Status: DC
  Filled 2017-09-15: qty 15

## 2017-09-15 MED ORDER — FENTANYL CITRATE (PF) 100 MCG/2ML IJ SOLN
INTRAMUSCULAR | Status: AC
Start: 2017-09-15 — End: 2017-09-15
  Filled 2017-09-15: qty 2

## 2017-09-15 MED ORDER — MIDAZOLAM HCL 10 MG/2ML IJ SOLN
INTRAMUSCULAR | Status: DC | PRN
  Administered 2017-09-15: 2 mg via INTRAVENOUS

## 2017-09-15 MED ORDER — DICLOFENAC SODIUM 1 % TD GEL
2.0000 g | Freq: Two times a day (BID) | TRANSDERMAL | Status: DC
  Administered 2017-09-15 – 2017-09-26 (×21): 2 g via TOPICAL
  Filled 2017-09-15 (×2): qty 100

## 2017-09-15 MED ORDER — ACETAMINOPHEN 325 MG PO TABS
650.0000 mg | ORAL_TABLET | Freq: Four times a day (QID) | ORAL | Status: DC | PRN
  Administered 2017-09-16 – 2017-09-24 (×4): 650 mg via ORAL
  Filled 2017-09-15 (×5): qty 2

## 2017-09-15 NOTE — ED Provider Notes (Signed)
[name]-ICU/STEPDOWN Provider Note   CSN: [id number] Arrival date & time: 09/15/17  1606     History   Chief Complaint Chief Complaint  Patient presents with  . Abdominal Pain    HPI Maria Christian is a 84 y.o. female.  The history is provided by the patient. No language interpreter was used.  Abdominal Pain      Maria Christian is a 84 y.o. female who presents to the Emergency Department complaining of abdominal pain. Level V caveat due to dementia. History is provided by the patient's daughter. She presents from [facility] facility for evaluation of abdominal pain and constipation. Her last bowel movement was two days ago. Since that time she's had progressive abdominal bloating and right lower quadrant abdominal pain. She has been having intermittent vomiting for the last two days. No reports of fevers. Symptoms are severe, constant, worsening. She has a history of recurrent sigmoid volvulus requiring reduction multiple times.  Past Medical History:  Diagnosis Date  . Altered mental status   . Anemia   . Atrial fibrillation ([place])   . CKD (chronic kidney disease) stage 3, GFR 30-59 ml/min (HCC) 02/27/2015  . Constipation 02/27/2015  . Coronary artery disease   . Dementia   . Depression   . Diabetes mellitus   . Edema of lower extremity 07/13/11   right leg more swollen than left leg  . Encephalopathy   . Enlarged heart   . Esophageal dysmotility 07/02/12  . GERD (gastroesophageal reflux disease)   . History of adenomatous polyp of colon 06/24/99  . Horseshoe kidney   . Hyperlipemia   . Hypertension   . Pancreatic lesion 05/22/11   no further workup  per PCP/family due to age  . Parkinson disease ([place])   . Peripheral neuropathy   . Sigmoid volvulus ([place]) 05/24/2013  . Thrombophlebitis   . TIA (transient ischemic attack) 12/19/2012    Patient Active Problem List   Diagnosis Date Noted  . Sigmoid volvulus ([place]) 09/15/2017  . UTI  (urinary tract infection) 06/28/2017  . Acute bronchitis 04/16/2017  . Abnormal weight loss 03/02/2017  . TMJ (temporomandibular joint syndrome) 03/02/2017  . Shingles 12/11/2016  . Dilatation of colon   . Diarrhea 01/04/2016  . Acute kidney injury superimposed on chronic kidney disease ([place]) 01/04/2016  . GERD (gastroesophageal reflux disease) 01/04/2016  . DM (diabetes mellitus), type 2 ([place]) 01/04/2016  . Hypertensive heart disease with congestive heart failure ([place]) 12/02/2015  . Vitamin B12 deficiency 09/27/2015  . Chronic respiratory failure ([place]) 03/16/2015  . Allergic rhinitis 03/16/2015  . Chronic pain 03/16/2015  . Osteoarthritis of multiple joints 03/16/2015  . Duodenal ulcer 03/12/2015  . Headache 03/09/2015  . Cephalgia 03/01/2015  . Constipation 02/27/2015  . Anemia of chronic disease 02/27/2015  . Chronic atrial fibrillation ([place]) 02/27/2015  . Hypokalemia 02/27/2015  . CKD (chronic kidney disease) stage 3, GFR 30-59 ml/min (HCC) 02/27/2015  . Ileus ([place]) 02/26/2015  . Malnutrition of moderate degree ([place]) 10/10/2013  . Normocytic anemia 10/09/2013  . FTT (failure to thrive) in adult 07/29/2013  . Volvulus of sigmoid colon ([place]) 05/26/2013  . Dementia without behavioral disturbance 12/19/2012  . Depression 12/19/2012  . Thrombocytopenia ([place]) 07/16/2011  . Coronary artery disease   . Diastolic CHF, chronic ([place]) 05/24/2011    Past Surgical History:  Procedure Laterality Date  . ABDOMINAL HYSTERECTOMY    . BOWEL DECOMPRESSION N/A 04/18/2016   Procedure: BOWEL DECOMPRESSION;  Surgeon: Lajuan Lines  Pyrtle, MD;  Location: [facility];  Service: Endoscopy;  Laterality: N/A;  . BREAST SURGERY     2 benign tumors removed left breast  . CHOLECYSTECTOMY    . ESOPHAGEAL DILATION     several times by Dr. [name]  . ESOPHAGOGASTRODUODENOSCOPY (EGD) WITH ESOPHAGEAL DILATION N/A 08/01/2012   Procedure: ESOPHAGOGASTRODUODENOSCOPY (EGD) WITH ESOPHAGEAL DILATION;  Surgeon:  [name], MD;  Location: [facility];  Service: Endoscopy;  Laterality: N/A;  with c-arm savory dilators  . ESOPHAGOGASTRODUODENOSCOPY (EGD) WITH PROPOFOL N/A 03/12/2015   Procedure: ESOPHAGOGASTRODUODENOSCOPY (EGD) WITH PROPOFOL;  Surgeon: [name], MD;  Location: [facility];  Service: Endoscopy;  Laterality: N/A;  . FLEXIBLE SIGMOIDOSCOPY N/A 05/24/2013   Procedure: FLEXIBLE SIGMOIDOSCOPY;  Surgeon: [name], MD;  Location: [facility];  Service: Endoscopy;  Laterality: N/A;  . FLEXIBLE SIGMOIDOSCOPY N/A 05/26/2013   Procedure:  flex with decompression of sigmoid volvulus;  Surgeon: [name], MD;  Location: [facility];  Service: Endoscopy;  Laterality: N/A;  . FLEXIBLE SIGMOIDOSCOPY N/A 12/20/2015   Procedure: FLEXIBLE SIGMOIDOSCOPY;  Surgeon: Milus Banister, MD;  Location: [facility];  Service: Endoscopy;  Laterality: N/A;  . FLEXIBLE SIGMOIDOSCOPY N/A 04/17/2016   Procedure: FLEXIBLE SIGMOIDOSCOPY;  Surgeon: Ladene Artist, MD;  Location: [facility];  Service: Endoscopy;  Laterality: N/A;  . FLEXIBLE SIGMOIDOSCOPY N/A 04/18/2016   Procedure: FLEXIBLE SIGMOIDOSCOPY;  Surgeon: [name], MD;  Location: [facility];  Service: Endoscopy;  Laterality: N/A;  . FLEXIBLE SIGMOIDOSCOPY N/A 09/15/2017   Procedure: FLEXIBLE SIGMOIDOSCOPY;  Surgeon: Jackquline Denmark, MD;  Location: [facility];  Service: Endoscopy;  Laterality: N/A;  . FOOT SURGERY     benign tumors from foot  . NOSE SURGERY    . SHOULDER SURGERY  2001   left clavicle excision and acromioplasty  . TOTAL HIP ARTHROPLASTY       OB History   None      Home Medications    Prior to Admission medications   Medication Sig Start Date End Date Taking? Authorizing Provider  amLODipine (NORVASC) 2.5 MG tablet Take 2.5 mg by mouth every morning. Hold for BP < 100/60 or HR < 60   Yes [provider]  aspirin-acetaminophen-caffeine (EXCEDRIN MIGRAINE) 250-250-65 MG tablet Take 2 tablets by  mouth every 12 (twelve) hours as needed for migraine. 08/10/17  Yes [provider]  baclofen (LIORESAL) 10 MG tablet Take 5 mg by mouth at bedtime. Take 1/2 of a 10mg  tablet.   Yes [provider]  benzocaine-menthol (CHLORASEPTIC) 6-10 MG lozenge Take 1 lozenge by mouth as needed for sore throat.   Yes [provider]  calcium carbonate (TUMS - DOSED IN MG ELEMENTAL CALCIUM) 500 MG chewable tablet Chew 2 tablets by mouth 3 (three) times daily.   Yes [provider]  Cranberry 475 MG CAPS Take 1 capsule by mouth 2 (two) times daily.    Yes [provider]  diclofenac sodium (VOLTAREN) 1 % GEL Apply 2 grams to neck topically three times a day for pain 05/15/17  Yes [provider]  docusate sodium (COLACE) 100 MG capsule Take 200 mg by mouth at bedtime.    Yes [provider]  dorzolamide (TRUSOPT) 2 % ophthalmic solution Place 1 drop into both eyes 3 (three) times daily.   Yes [provider]  escitalopram (LEXAPRO) 10 MG tablet Take 10 mg by mouth daily.   Yes [provider]  ferrous sulfate 220 (44 Fe) MG/5ML solution  Give 5 ml by mouth in the evening for supplement   Yes [provider]  fluticasone (FLONASE) 50 MCG/ACT nasal spray Place 2 sprays into both nostrils at bedtime.   Yes [provider]  gabapentin (NEURONTIN) 100 MG capsule Take 100 mg by mouth at bedtime.   Yes [provider]  ipratropium-albuterol (DUONEB) 0.5-2.5 (3) MG/3ML SOLN Take 3 mLs by nebulization every 6 (six) hours as needed (wheezing/SOB for 14 days).   Yes [provider]  Linaclotide (LINZESS) 145 MCG CAPS capsule Take 145 mcg by mouth daily before breakfast.    Yes [provider]  loratadine (CLARITIN) 10 MG tablet Take 10 mg by mouth daily before breakfast.    Yes [provider]  Multiple Vitamins-Minerals (CERTAGEN PO) Take 1 tablet by mouth every evening.    Yes [provider]  Multiple Vitamins-Minerals (HAIR SKIN AND NAILS FORMULA) TABS Take by mouth. Give 2 tablets by mouth daily   Yes [provider]  nystatin (MYCOSTATIN/NYSTOP) powder Apply 1 g topically 2 (two) times daily. Apply to perineal area topically two times a day for rash for 7 days.   Yes [provider]  nystatin cream (MYCOSTATIN) Apply 1 application topically 2 (two) times daily. Apply to buttocks topically two times a day for redness    Yes [provider]  omeprazole (PRILOSEC) 20 MG capsule Take 20 mg by mouth daily.   Yes [provider]  ondansetron (ZOFRAN) 8 MG tablet Take 8 mg by mouth every 8 (eight) hours as needed for nausea or vomiting.   Yes [provider]  Phenazopyridine HCl (AZO TABS PO) Take 1 tablet by mouth at bedtime.   Yes [provider]  polyethylene glycol (MIRALAX / GLYCOLAX) packet Take 17 g by mouth daily.    Yes [provider]  potassium chloride SA (K-DUR,KLOR-CON) 20 MEQ tablet Take 20-40 mEq by mouth 2 (two) times daily. Give 40 meq (2 tablets) in the morning and Give 20 meq (1 tablet) by mouth in the afternoon. 05/06/17  Yes [provider]  senna (SENOKOT) 8.6 MG TABS tablet Take 1 tablet (8.6 mg total) by mouth 2 (two) times daily. 01/05/14  Yes Barton Dubois, MD  simethicone (MYLICON) 810 MG chewable tablet Chew 125 mg by mouth every 6 (six) hours as needed for flatulence.   Yes [provider]  sodium chloride (OCEAN) 0.65 % SOLN nasal spray Place 1 spray into both nostrils as directed. Four times daily And every 24 hours as needed to moisturize nasal passages.   Yes [provider]  torsemide (DEMADEX) 20 MG tablet Take 20 mg by mouth daily.   Yes [provider]  Travoprost, BAK Free, (TRAVATAN) 0.004 % SOLN ophthalmic solution Place 1 drop into both eyes daily at 8 pm.    Yes [provider]  vitamin B-12 (CYANOCOBALAMIN) 500 MCG tablet Take 500  mcg by mouth every evening.    Yes [provider]    Family History Family History  Problem Relation Age of Onset  . Cancer Unknown   . Heart disease Unknown     Social History Social History   Tobacco Use  . Smoking status: Never Smoker  . Smokeless tobacco: Never Used  Substance Use Topics  . Alcohol use: No  . Drug use: No     Allergies   Aspirin   Review of Systems Review of Systems  Gastrointestinal: Positive for abdominal pain.  All other systems reviewed  and are negative.    Physical Exam Updated Vital Signs BP (!) 141/48 (BP Location: Right Arm)   Pulse 79   Temp 98.2 F (36.8 C) (Oral)   Resp 14   Wt 71 kg (156 lb 8.4 oz)   SpO2 96%   BMI 24.52 kg/m   Physical Exam  Constitutional: She is oriented to person, place, and time. She appears well-developed and well-nourished.  HENT:  Head: Normocephalic and atraumatic.  Cardiovascular: Normal rate and regular rhythm.  No murmur heard. Pulmonary/Chest: Effort normal and breath sounds normal. No respiratory distress.  Abdominal:  Firm and distended abdomen. Tingling and hyperactive bowel sounds. Mild right-sided abdominal tenderness.  Musculoskeletal: She exhibits no edema or tenderness.  Neurological: She is alert and oriented to person, place, and time.  Skin: Skin is warm and dry.  Psychiatric: She has a normal mood and affect. Her behavior is normal.  Nursing note and vitals reviewed.    ED Treatments / Results  Labs (all labs ordered are listed, but only abnormal results are displayed) Labs Reviewed  COMPREHENSIVE METABOLIC PANEL - Abnormal; Notable for the following components:      Result Value   CO2 21 (*)    Glucose, Bld 175 (*)    BUN 43 (*)    Creatinine, Ser 2.17 (*)    ALT 12 (*)    GFR calc non Af Amer 19 (*)    GFR calc Af Amer 22 (*)    All other components within normal limits  CBC WITH DIFFERENTIAL/PLATELET - Abnormal; Notable for the following components:    RBC 3.17 (*)    Hemoglobin 10.7 (*)    HCT 33.7 (*)    MCV 106.3 (*)    Platelets 104 (*)    All other components within normal limits  I-STAT CHEM 8, ED - Abnormal; Notable for the following components:   BUN 38 (*)    Creatinine, Ser 2.10 (*)    Glucose, Bld 171 (*)    TCO2 21 (*)    Hemoglobin 11.6 (*)    HCT 34.0 (*)    All other components within normal limits  MRSA PCR SCREENING  URINALYSIS, ROUTINE W REFLEX MICROSCOPIC  CBC  BASIC METABOLIC PANEL  I-STAT CG4 LACTIC ACID, ED    EKG None  Radiology Ct Abdomen Pelvis Wo Contrast  Result Date: 09/15/2017 CLINICAL DATA:  Nausea, vomiting, high grade bowel obstruction, abdominal distension for 48 hours, history of chronic kidney disease, diabetes mellitus, atrial fibrillation, hypertension, Parkinson's, CHF, coronary artery disease EXAM: CT ABDOMEN AND PELVIS WITHOUT CONTRAST TECHNIQUE: Multidetector CT imaging of the abdomen and pelvis was performed following the standard protocol without IV contrast. Sagittal and coronal MPR images reconstructed from axial data set. No oral contrast was administered COMPARISON:  06/28/2017 FINDINGS: Lower chest: Bibasilar atelectasis. Hepatobiliary: Gallbladder surgically absent.  Liver unremarkable. Pancreas: Atrophic pancreas without obvious mass Spleen: Normal appearance Adrenals/Urinary Tract: Horseshoe kidney. Minimally prominent extrarenal pelves without definite hydronephrosis or hydroureter. Bladder unremarkable. Stomach/Bowel: Appendix not visualized but no pericecal inflammatory process seen. Marked gaseous distention of the sigmoid loop with tapered beak-like appearance of the sigmoid colon in the central pelvis consistent with sigmoid volvulus. Classic appearance on scout image. Sigmoid colon measures up to 11.7 cm transverse. Mild edema of sigmoid mesocolon. More proximally diffuse dilatation of the colon is identified to the cecal tip. Small bowel loops decompressed. No colonic wall  thickening or evidence of perforation. Stomach unremarkable. Vascular/Lymphatic: Extensive atherosclerotic calcifications aorta as well  as visceral, iliac, and coronary arteries. Aorta normal caliber. No adenopathy. Mitral annular calcification. Heart enlarged. Reproductive: Uterus surgically absent. Ovaries not visualized. No free air free fluid. Other: No free air or free fluid.  No hernia. Musculoskeletal: Diffuse osseous demineralization. LEFT hip prosthesis with associated beam hardening artifacts in pelvis. IMPRESSION: Sigmoid volvulus with significant gaseous distention of the sigmoid colon tapering to a beak-like appearance at the distal sigmoid colon. Associated edema of the sigmoid mesocolon and proximal colonic obstruction/dilatation. Sigmoid colon up to 11.7 cm diameter without evidence of perforation. Extensive atherosclerotic calcification including coronary arteries. Horseshoe kidney. Electronically Signed   By: Lavonia Dana M.D.   On: 09/15/2017 17:27   Dg Abd Portable 1 View  Result Date: 09/15/2017 CLINICAL DATA:  Nasogastric tube placement EXAM: PORTABLE ABDOMEN - 1 VIEW COMPARISON:  Portable exam 1941 hours compared to 1836 hours as well as earlier CT of 09/15/2017 FINDINGS: Nasogastric tube coiled in proximal stomach. Again identified marked gaseous distention of the sigmoid loop compatible with sigmoid volvulus. Mild gas distention of remainder of colon. Bones demineralized. IMPRESSION: Nasogastric tube coiled in proximal stomach. Marked gaseous distention of sigmoid loop consistent with sigmoid volvulus again identified. Electronically Signed   By: Lavonia Dana M.D.   On: 09/15/2017 20:18   Dg Abd Portable 1 View  Result Date: 09/15/2017 CLINICAL DATA:  Nasogastric tube placement EXAM: PORTABLE ABDOMEN - 1 VIEW COMPARISON:  CT abdomen and pelvis September 15, 2017 FINDINGS: Nasogastric tube tip is in the proximal stomach with the side port at the gastroesophageal junction. Evidence of  sigmoid volvulus is again noted with multiple loops of dilated colon. There is marked dilatation of the sigmoid colon which is located in the left upper to mid abdomen. No free air. There is a total hip replacement on the left. IMPRESSION: Nasogastric tube tip in the proximal stomach with side port at gastroesophageal junction. Advise advancing nasogastric tube approximately 8 cm to insure that both tube tip and side port are well within the stomach. Colonic dilatation with evidence of sigmoid volvulus, unchanged from earlier in the day. No free air. Electronically Signed   By: Lowella Grip III M.D.   On: 09/15/2017 19:03    Procedures Procedures (including critical care time) CRITICAL CARE Performed by: Quintella Reichert   Total critical care time: 35 minutes  Critical care time was exclusive of separately billable procedures and treating other patients.  Critical care was necessary to treat or prevent imminent or life-threatening deterioration.  Critical care was time spent personally by me on the following activities: development of treatment plan with patient and/or surrogate as well as nursing, discussions with consultants, evaluation of patient's response to treatment, examination of patient, obtaining history from patient or surrogate, ordering and performing treatments and interventions, ordering and review of laboratory studies, ordering and review of radiographic studies, pulse oximetry and re-evaluation of patient's condition.  Medications Ordered in ED Medications  lidocaine (XYLOCAINE) 2 % viscous mouth solution 15 mL ( Mouth/Throat MAR Unhold 09/15/17 1938)  0.9 %  sodium chloride infusion ( Intravenous New Bag/Given 09/15/17 2210)  acetaminophen (TYLENOL) tablet 650 mg (has no administration in time range)    Or  acetaminophen (TYLENOL) suppository 650 mg (has no administration in time range)  ondansetron (ZOFRAN) tablet 4 mg (has no administration in time range)    Or    ondansetron (ZOFRAN) injection 4 mg (has no administration in time range)  pantoprazole (PROTONIX) injection 40 mg (40 mg Intravenous Given 09/15/17 2252)  benzocaine-menthol (CHLORAEPTIC) lozenge 1 lozenge (has no administration in time range)  diclofenac sodium (VOLTAREN) 1 % transdermal gel 2 g (2 g Topical Given 09/15/17 2253)  dorzolamide (TRUSOPT) 2 % ophthalmic solution 1 drop (1 drop Both Eyes Given 09/15/17 2252)  sodium chloride (OCEAN) 0.65 % nasal spray 1 spray (has no administration in time range)  latanoprost (XALATAN) 0.005 % ophthalmic solution 1 drop (1 drop Both Eyes Given 09/15/17 2252)  nystatin (MYCOSTATIN/NYSTOP) topical powder 1 g (1 g Topical Given 09/15/17 2252)  ipratropium-albuterol (DUONEB) 0.5-2.5 (3) MG/3ML nebulizer solution 3 mL (has no administration in time range)  sodium chloride 0.9 % bolus 500 mL (0 mLs Intravenous Stopped 09/15/17 2148)  sodium chloride 0.9 % bolus 500 mL (0 mLs Intravenous Stopped 09/15/17 2148)  0.9 %  sodium chloride infusion (500 mLs Intravenous New Bag/Given 09/15/17 1900)     Initial Impression / Assessment and Plan / ED Course  I have reviewed the triage vital signs and the nursing notes.  Pertinent labs & imaging results that were available during my care of the patient were reviewed by me and considered in my medical decision making (see chart for details).     Patient here for evaluation of progressive abdominal pain, constipation, vomiting. She has a markedly distended abdomen on evaluation. CT demonstrates sigmoid volvulus. BMP with acute kidney injury. She was treated with IV fluids and nasogastric tube decompression. Discussed with gastroenterologist, Dr. Lyndel Safe, who evaluated the patient and decompressed the patient at the bedside. Post procedure patient was feeling improved and abdomen still distended but softer compared to prior evaluation. Hospitalist consulted for admission for further treatment.  Final Clinical Impressions(s)  / ED Diagnoses   Final diagnoses:  None    ED Discharge Orders    None       Quintella Reichert, MD 09/16/17 [phone]

## 2017-09-15 NOTE — ED Triage Notes (Signed)
Pt is presented [facility] for evaluation of abdominal pain with associated sx of nausea and constipation, subjective abdominal distension. Reported last BM date 3 days ago with continued laxatives use.

## 2017-09-15 NOTE — Consult Note (Signed)
Referring Provider: [facility]  Primary Care Physician:  [name], DO Primary Gastroenterologist:   Zenovia Jarred MD  Reason for Consultation:  Sigmoid volvulus   ASSESSMENT AND PLAN:    RECURRENT SIGMOID VOLVULUS: - Emergent sigmoidoscopy for decompression. Consent obtained from the daughter. -IV fluids -Watch for any signs of ischemia. -Refused surgery in the past. Daughter does understand the recurrent nature and risks for ischemia which may potentially require emergent surgery with colostomy. -CT scan was reviewed independently.    HPI: Maria Christian is a 84 y.o. female past medical history significant for a fib, hypertension, Parkinson's, GERD, hyperlipidemia, diabetes, coronary artery disease, diastolic CHF, sigmoid volvulus, peripheral neuropathy and depression who presented to the ED from [facility], with a chief complaint of abdominal pain, abdominal distention, nausea and vomiting. The patients daughter is at the bedside and also provides history.  CT scan showed sig volvulus. GI being consulted for emergent decompression.  Recent endoscopic procedures:  12/20/15 Flex Sig Dr. Ardis Hughs: Chronic intermittent sigmoid volvulus. The site of volvulus was only slightly twisted during this exam, easily passed with colonoscope and the air filled, dilated colon proximal to it was suctioned extensively. Her abdomen was flat, normal after the examination.  03/12/15-EGD, Dr. Deatra Ina: Impression:In the duodenal bulb there is a 1.5-2 cm ulcer with clot at the base. There was no active bleeding. Surrounding mucosa was edematous. EGD was otherwise normal;pathology:Benign ulcerative duodenitis  05/26/13-flex sig, Dr. Deatra Ina: Impression:Sigmoid volvulus-status post reduction of volvulus and insertion of colon decompression tube  05/24/13-flexible sigmoidoscopy more endoscopy, Dr. Hilarie Fredrickson: Impression:Evidence of sigmoid volvulus with focal erythema without frank  ischemia/necrosis, successful decompression, dilated sigmoid and descending colon, and decompression successful  08/01/12-EGD, Dr. Olevia Perches: Impression:Presbyesophagus with intermittent spasm, dilated      Past Medical History:  Diagnosis Date  . Altered mental status   . Anemia   . Atrial fibrillation ([place])   . CKD (chronic kidney disease) stage 3, GFR 30-59 ml/min (HCC) 02/27/2015  . Constipation 02/27/2015  . Coronary artery disease   . Dementia   . Depression   . Diabetes mellitus   . Edema of lower extremity 07/13/11   right leg more swollen than left leg  . Encephalopathy   . Enlarged heart   . Esophageal dysmotility 07/02/12  . GERD (gastroesophageal reflux disease)   . History of adenomatous polyp of colon 06/24/99  . Horseshoe kidney   . Hyperlipemia   . Hypertension   . Pancreatic lesion 05/22/11   no further workup  per PCP/family due to age  . Parkinson disease ([place])   . Peripheral neuropathy   . Sigmoid volvulus ([place]) 05/24/2013  . Thrombophlebitis   . TIA (transient ischemic attack) 12/19/2012    Past Surgical History:  Procedure Laterality Date  . ABDOMINAL HYSTERECTOMY    . BOWEL DECOMPRESSION N/A 04/18/2016   Procedure: BOWEL DECOMPRESSION;  Surgeon: [name], MD;  Location: [facility];  Service: Endoscopy;  Laterality: N/A;  . BREAST SURGERY     2 benign tumors removed left breast  . CHOLECYSTECTOMY    . ESOPHAGEAL DILATION     several times by Dr. [name]  . ESOPHAGOGASTRODUODENOSCOPY (EGD) WITH ESOPHAGEAL DILATION N/A 08/01/2012   Procedure: ESOPHAGOGASTRODUODENOSCOPY (EGD) WITH ESOPHAGEAL DILATION;  Surgeon: [name], MD;  Location: [facility];  Service: Endoscopy;  Laterality: N/A;  with c-arm savory dilators  . ESOPHAGOGASTRODUODENOSCOPY (EGD) WITH PROPOFOL N/A 03/12/2015   Procedure: ESOPHAGOGASTRODUODENOSCOPY (EGD) WITH PROPOFOL;  Surgeon: [name], MD;  Location: [facility];  Service: Endoscopy;  Laterality: N/A;  .  FLEXIBLE SIGMOIDOSCOPY N/A 05/24/2013   Procedure: FLEXIBLE SIGMOIDOSCOPY;  Surgeon: [name], MD;  Location: [facility];  Service: Endoscopy;  Laterality: N/A;  . FLEXIBLE SIGMOIDOSCOPY N/A 05/26/2013   Procedure:  flex with decompression of sigmoid volvulus;  Surgeon: [name], MD;  Location: [facility];  Service: Endoscopy;  Laterality: N/A;  . FLEXIBLE SIGMOIDOSCOPY N/A 12/20/2015   Procedure: FLEXIBLE SIGMOIDOSCOPY;  Surgeon: Milus Banister, MD;  Location: [facility];  Service: Endoscopy;  Laterality: N/A;  . FLEXIBLE SIGMOIDOSCOPY N/A 04/17/2016   Procedure: FLEXIBLE SIGMOIDOSCOPY;  Surgeon: Ladene Artist, MD;  Location: [facility];  Service: Endoscopy;  Laterality: N/A;  . FLEXIBLE SIGMOIDOSCOPY N/A 04/18/2016   Procedure: FLEXIBLE SIGMOIDOSCOPY;  Surgeon: [name], MD;  Location: [facility];  Service: Endoscopy;  Laterality: N/A;  . FOOT SURGERY     benign tumors from foot  . NOSE SURGERY    . SHOULDER SURGERY  2001   left clavicle excision and acromioplasty  . TOTAL HIP ARTHROPLASTY      Prior to Admission medications   Medication Sig Start Date End Date Taking? Authorizing Provider  amLODipine (NORVASC) 2.5 MG tablet Take 2.5 mg by mouth every morning. Hold for BP < 100/60 or HR < 60   Yes [provider]  aspirin-acetaminophen-caffeine (EXCEDRIN MIGRAINE) 250-250-65 MG tablet Take 2 tablets by mouth every 12 (twelve) hours as needed for migraine. 08/10/17  Yes [provider]  baclofen (LIORESAL) 10 MG tablet Take 5 mg by mouth at bedtime. Take 1/2 of a 10mg  tablet.   Yes [provider]  benzocaine-menthol (CHLORASEPTIC) 6-10 MG lozenge Take 1 lozenge by mouth as needed for sore throat.   Yes [provider]  calcium carbonate (TUMS - DOSED IN MG ELEMENTAL CALCIUM) 500 MG chewable tablet Chew 2 tablets by mouth 3 (three) times daily.   Yes [provider]  Cranberry 475 MG CAPS Take 1 capsule by mouth 2 (two)  times daily.    Yes [provider]  diclofenac sodium (VOLTAREN) 1 % GEL Apply 2 grams to neck topically three times a day for pain 05/15/17  Yes [provider]  docusate sodium (COLACE) 100 MG capsule Take 200 mg by mouth at bedtime.    Yes [provider]  dorzolamide (TRUSOPT) 2 % ophthalmic solution Place 1 drop into both eyes 3 (three) times daily.   Yes [provider]  escitalopram (LEXAPRO) 10 MG tablet Take 10 mg by mouth daily.   Yes [provider]  ferrous sulfate 220 (44 Fe) MG/5ML solution Give 5 ml by mouth in the evening for supplement   Yes [provider]  fluticasone (FLONASE) 50 MCG/ACT nasal spray Place 2 sprays into both nostrils at bedtime.   Yes [provider]  gabapentin (NEURONTIN) 100 MG capsule Take 100 mg by mouth at bedtime.   Yes [provider]  ipratropium-albuterol (DUONEB) 0.5-2.5 (3) MG/3ML SOLN Take 3 mLs by nebulization every 6 (six) hours as needed (wheezing/SOB for 14 days).   Yes [provider]  Linaclotide (LINZESS) 145 MCG CAPS capsule Take 145 mcg by mouth daily before breakfast.    Yes [provider]  loratadine (CLARITIN) 10 MG tablet Take 10 mg by mouth daily before breakfast.    Yes [provider]  Multiple Vitamins-Minerals (CERTAGEN PO) Take 1 tablet by mouth every evening.    Yes [provider]  Multiple Vitamins-Minerals (HAIR SKIN AND NAILS FORMULA) TABS Take by mouth. Give 2 tablets by mouth daily   Yes [provider]  nystatin (MYCOSTATIN/NYSTOP) powder Apply 1 g topically 2 (two) times daily. Apply to perineal area topically two times a day for rash for 7 days.   Yes [provider]  nystatin cream (MYCOSTATIN) Apply 1 application topically 2 (two) times daily. Apply to buttocks topically two times a day for redness    Yes [provider]  omeprazole (PRILOSEC) 20 MG capsule Take 20 mg by mouth daily.    Yes [provider]  ondansetron (ZOFRAN) 8 MG tablet Take 8 mg by mouth every 8 (eight) hours as needed for nausea or vomiting.   Yes [provider]  Phenazopyridine HCl (AZO TABS PO) Take 1 tablet by mouth at bedtime.   Yes [provider]  polyethylene glycol (MIRALAX / GLYCOLAX) packet Take 17 g by mouth daily.    Yes [provider]  potassium chloride SA (K-DUR,KLOR-CON) 20 MEQ tablet Take 20-40 mEq by mouth 2 (two) times daily. Give 40 meq (2 tablets) in the morning and Give 20 meq (1 tablet) by mouth in the afternoon. 05/06/17  Yes [provider]  senna (SENOKOT) 8.6 MG TABS tablet Take 1 tablet (8.6 mg total) by mouth 2 (two) times daily. 01/05/14  Yes Barton Dubois, MD  simethicone (MYLICON) 546 MG chewable tablet Chew 125 mg by mouth every 6 (six) hours as needed for flatulence.   Yes [provider]  sodium chloride (OCEAN) 0.65 % SOLN nasal spray Place 1 spray into both nostrils as directed. Four times daily And every 24 hours as needed to moisturize nasal passages.   Yes [provider]  torsemide (DEMADEX) 20 MG tablet Take 20 mg by mouth daily.   Yes [provider]  Travoprost, BAK Free, (TRAVATAN) 0.004 % SOLN ophthalmic solution Place 1 drop into both eyes daily at 8 pm.    Yes [provider]  vitamin B-12 (CYANOCOBALAMIN) 500 MCG tablet Take 500 mcg by mouth every evening.    Yes [provider]    Current Facility-Administered Medications  Medication Dose Route Frequency Provider Last Rate Last Dose  . [MAR Hold] lidocaine (XYLOCAINE) 2 % viscous mouth solution 15 mL  15 mL Mouth/Throat Once Quintella Reichert, MD      . sodium chloride 0.9 % bolus 500 mL  500 mL Intravenous Once Quintella Reichert, MD 491.8 mL/hr at 09/15/17 1936 500 mL at 09/15/17 1936  . sodium chloride 0.9 % bolus 500 mL  500 mL Intravenous Once Quintella Reichert, MD 491.8 mL/hr at 09/15/17 1935 500 mL at 09/15/17 1935     Allergies as of 09/15/2017 - Review Complete 09/15/2017  Allergen Reaction Noted  . Aspirin Other (See Comments) 05/26/2013    Family History  Problem Relation Age of Onset  . Cancer Unknown   . Heart disease Unknown     Social History   Socioeconomic History  . Marital status: Widowed    Spouse name: Not on file  . Number of children: 5  . Years of education: 10th  . Highest education level: Not on file  Occupational History  . Occupation: Retired  Scientific laboratory technician  . Financial resource strain: Not on file  . Food insecurity:    Worry: Not on file    Inability: Not on file  . Transportation needs:    Medical: Not on file    Non-medical: Not on file  Tobacco Use  . Smoking status: Never Smoker  . Smokeless tobacco: Never Used  Substance and Sexual Activity  . Alcohol use: No  . Drug use: No  . Sexual activity: Never  Lifestyle  . Physical activity:    Days per week: Not on file    Minutes per session: Not on file  . Stress: Not on file  Relationships  . Social connections:    Talks on phone: Not on file    Gets together: Not on file    Attends religious service: Not on file    Active member of club or organization: Not on file    Attends meetings of clubs or organizations: Not on file    Relationship status: Not on file  . Intimate partner violence:    Fear of current or ex partner: Not on file    Emotionally abused: Not on file    Physically abused: Not on file    Forced sexual activity: Not on file  Other Topics Concern  . Not on file  Social History Narrative   Pt lives at [facility]   Caffeine Use: very small amount daily    Review of Systems: All systems reviewed and negative except where noted in HPI.  Physical Exam: Vital signs in last 24 hours: Temp:  [98.4 F (36.9 C)] 98.4 F (36.9 C) (04/13 1644) Pulse Rate:  [65-93] 84 (04/13 1930) Resp:  [16-28] 28 (04/13 1930) BP: (87-132)/(44-65) 104/51 (04/13 1930) SpO2:  [91 %-100 %] 91 %  (04/13 1930)   General:   Frail, NAD Psych:  dementia Eyes:  Pupils equal, sclera clear, no icterus.   Conjunctiva pink. Ears:  Normal auditory acuity. Nose:  No deformity, discharge,  or lesions. Neck:  Supple; no masses Lungs:  Clear throughout to auscultation.   No wheezes, crackles, or rhonchi.  Heart:  Regular rate and rhythm; no murmurs, no edema Abdomen:  Distended and tight Rectal:  Deferred  Msk:  Symmetrical without gross deformities. . Pulses:  Normal pulses noted. Neurologic:  Advanced dementia. Skin:  Intact without significant lesions or rashes..   Intake/Output from previous day: No intake/output data recorded. Intake/Output this shift: No intake/output data recorded.  Lab Results: Recent Labs    09/15/17 1650 09/15/17 1654  WBC 7.8  --   HGB 10.7* 11.6*  HCT 33.7* 34.0*  PLT 104*  --    BMET Recent Labs    09/15/17 1650 09/15/17 1654  NA 140 140  K 4.0 4.0  CL 109 109  CO2 21*  --   GLUCOSE 175* 171*  BUN 43* 38*  CREATININE 2.17* 2.10*  CALCIUM 9.4  --    LFT Recent Labs    09/15/17 1650  PROT 8.1  ALBUMIN 3.9  AST 19  ALT 12*  ALKPHOS 99  BILITOT 0.8   PT/INR No results for input(s): LABPROT, INR in the last 72 hours. Hepatitis Panel No results for input(s): HEPBSAG, HCVAB, HEPAIGM, HEPBIGM in the last 72 hours.    Studies/Results: Ct Abdomen Pelvis Wo Contrast  Result Date: 09/15/2017 CLINICAL DATA:  Nausea, vomiting, high grade bowel obstruction, abdominal distension for 48 hours, history of chronic kidney disease, diabetes mellitus, atrial fibrillation, hypertension, Parkinson's, CHF, coronary artery disease EXAM: CT ABDOMEN AND PELVIS WITHOUT CONTRAST TECHNIQUE: Multidetector CT imaging of the abdomen and pelvis was performed following the standard protocol without IV contrast. Sagittal and coronal MPR images reconstructed from axial data set. No oral contrast was administered COMPARISON:  06/28/2017 FINDINGS:  Lower chest:  Bibasilar atelectasis. Hepatobiliary: Gallbladder surgically absent.  Liver unremarkable. Pancreas: Atrophic pancreas without obvious mass Spleen: Normal appearance Adrenals/Urinary Tract: Horseshoe kidney. Minimally prominent extrarenal pelves without definite hydronephrosis or hydroureter. Bladder unremarkable. Stomach/Bowel: Appendix not visualized but no pericecal inflammatory process seen. Marked gaseous distention of the sigmoid loop with tapered beak-like appearance of the sigmoid colon in the central pelvis consistent with sigmoid volvulus. Classic appearance on scout image. Sigmoid colon measures up to 11.7 cm transverse. Mild edema of sigmoid mesocolon. More proximally diffuse dilatation of the colon is identified to the cecal tip. Small bowel loops decompressed. No colonic wall thickening or evidence of perforation. Stomach unremarkable. Vascular/Lymphatic: Extensive atherosclerotic calcifications aorta as well as visceral, iliac, and coronary arteries. Aorta normal caliber. No adenopathy. Mitral annular calcification. Heart enlarged. Reproductive: Uterus surgically absent. Ovaries not visualized. No free air free fluid. Other: No free air or free fluid.  No hernia. Musculoskeletal: Diffuse osseous demineralization. LEFT hip prosthesis with associated beam hardening artifacts in pelvis. IMPRESSION: Sigmoid volvulus with significant gaseous distention of the sigmoid colon tapering to a beak-like appearance at the distal sigmoid colon. Associated edema of the sigmoid mesocolon and proximal colonic obstruction/dilatation. Sigmoid colon up to 11.7 cm diameter without evidence of perforation. Extensive atherosclerotic calcification including coronary arteries. Horseshoe kidney. Electronically Signed   By: Lavonia Dana M.D.   On: 09/15/2017 17:27   Dg Abd Portable 1 View  Result Date: 09/15/2017 CLINICAL DATA:  Nasogastric tube placement EXAM: PORTABLE ABDOMEN - 1 VIEW COMPARISON:  CT abdomen and pelvis  September 15, 2017 FINDINGS: Nasogastric tube tip is in the proximal stomach with the side port at the gastroesophageal junction. Evidence of sigmoid volvulus is again noted with multiple loops of dilated colon. There is marked dilatation of the sigmoid colon which is located in the left upper to mid abdomen. No free air. There is a total hip replacement on the left. IMPRESSION: Nasogastric tube tip in the proximal stomach with side port at gastroesophageal junction. Advise advancing nasogastric tube approximately 8 cm to insure that both tube tip and side port are well within the stomach. Colonic dilatation with evidence of sigmoid volvulus, unchanged from earlier in the day. No free air. Electronically Signed   By: Lowella Grip III M.D.   On: 09/15/2017 19:03     Carmell Austria MD @  09/15/2017, 7:38 PM

## 2017-09-15 NOTE — H&P (Signed)
History and Physical    Maria Christian DOB: [birth year] DOA: 09/15/2017  Referring MD/NP/PA: Dr. Ralene Bathe PCP: [name], DO  Patient coming from: [place] facility via EMS  Chief Complaint: Abdominal pain  I have personally briefly reviewed patient's old medical records in [place]   HPI: Maria Christian is a 84 y.o. female with medical history significant of HTN, A. fib, diastolic CHF, recurrent sigmoid volvulus, chronic respiratory failure, peripheral neuropathy, and depression; who presented with complaints of abdominal pain, distention, nausea, and vomiting for the last 3 days.  Much of history is obtained from the patient's daughter who is present at bedside.  At baseline patient has issues with ambulation and is wheelchair-bound.  She is unsure of when the patient last had a bowel movement.  The patient has had to be admitted for volvulus of her intestines on 4-5 other separate occasions requiring decompression since 2014.  The patient's son has not wanted the patient to undergo any surgical intervention.  Associated symptoms include complaints of right lower extremity swelling that is chronic and severe right-sided headaches with radiation pain down her neck.  At the nursing facility they have giving her Excedrin migraine medication without relief of symptoms.  Daughter makes note that her veins pop out of her head and she is in need of imaging of her brain.   ED Course: Upon admission into the emergency department patient was noted to be afebrile, pulse 65-93, respirations 16-28, blood pressure 87/52 132/65, and O2 saturations 91-100% on 2-3L.  Labs revealed hemoglobin 10.7, platelets 104, BUN 43, creatinine 2.17.  Imaging studies revealed acute sigmoid volvulus.  A nasogastric tube was placed and the patient was given 1 L of normal saline IV fluids..  Dr. Lyndel Safe performed emergent bedside sigmoidoscopy for decompression.  Patient currently denies having any  pain complaints.  Review of Systems  Unable to perform ROS: Dementia  Constitutional: Positive for malaise/fatigue. Negative for fever.  Cardiovascular: Negative for leg swelling (Right lower extremity (chronic)).  Gastrointestinal: Positive for abdominal pain, constipation, nausea and vomiting.  Neurological: Positive for headaches.  Psychiatric/Behavioral: Positive for memory loss.    Past Medical History:  Diagnosis Date  . Altered mental status   . Anemia   . Atrial fibrillation ([place])   . CKD (chronic kidney disease) stage 3, GFR 30-59 ml/min (HCC) 02/27/2015  . Constipation 02/27/2015  . Coronary artery disease   . Dementia   . Depression   . Diabetes mellitus   . Edema of lower extremity 07/13/11   right leg more swollen than left leg  . Encephalopathy   . Enlarged heart   . Esophageal dysmotility 07/02/12  . GERD (gastroesophageal reflux disease)   . History of adenomatous polyp of colon 06/24/99  . Horseshoe kidney   . Hyperlipemia   . Hypertension   . Pancreatic lesion 05/22/11   no further workup  per PCP/family due to age  . Parkinson disease ([place])   . Peripheral neuropathy   . Sigmoid volvulus ([place]) 05/24/2013  . Thrombophlebitis   . TIA (transient ischemic attack) 12/19/2012    Past Surgical History:  Procedure Laterality Date  . ABDOMINAL HYSTERECTOMY    . BOWEL DECOMPRESSION N/A 04/18/2016   Procedure: BOWEL DECOMPRESSION;  Surgeon: [name], MD;  Location: [facility];  Service: Endoscopy;  Laterality: N/A;  . BREAST SURGERY     2 benign tumors removed left breast  . CHOLECYSTECTOMY    . ESOPHAGEAL DILATION  several times by Dr. [name]  . ESOPHAGOGASTRODUODENOSCOPY (EGD) WITH ESOPHAGEAL DILATION N/A 08/01/2012   Procedure: ESOPHAGOGASTRODUODENOSCOPY (EGD) WITH ESOPHAGEAL DILATION;  Surgeon: [name], MD;  Location: [facility];  Service: Endoscopy;  Laterality: N/A;  with c-arm savory dilators  . ESOPHAGOGASTRODUODENOSCOPY (EGD) WITH  PROPOFOL N/A 03/12/2015   Procedure: ESOPHAGOGASTRODUODENOSCOPY (EGD) WITH PROPOFOL;  Surgeon: [name], MD;  Location: [facility];  Service: Endoscopy;  Laterality: N/A;  . FLEXIBLE SIGMOIDOSCOPY N/A 05/24/2013   Procedure: FLEXIBLE SIGMOIDOSCOPY;  Surgeon: [name], MD;  Location: [facility];  Service: Endoscopy;  Laterality: N/A;  . FLEXIBLE SIGMOIDOSCOPY N/A 05/26/2013   Procedure:  flex with decompression of sigmoid volvulus;  Surgeon: [name], MD;  Location: [facility];  Service: Endoscopy;  Laterality: N/A;  . FLEXIBLE SIGMOIDOSCOPY N/A 12/20/2015   Procedure: FLEXIBLE SIGMOIDOSCOPY;  Surgeon: Milus Banister, MD;  Location: [facility];  Service: Endoscopy;  Laterality: N/A;  . FLEXIBLE SIGMOIDOSCOPY N/A 04/17/2016   Procedure: FLEXIBLE SIGMOIDOSCOPY;  Surgeon: Ladene Artist, MD;  Location: [facility];  Service: Endoscopy;  Laterality: N/A;  . FLEXIBLE SIGMOIDOSCOPY N/A 04/18/2016   Procedure: FLEXIBLE SIGMOIDOSCOPY;  Surgeon: [name], MD;  Location: [facility];  Service: Endoscopy;  Laterality: N/A;  . FOOT SURGERY     benign tumors from foot  . NOSE SURGERY    . SHOULDER SURGERY  2001   left clavicle excision and acromioplasty  . TOTAL HIP ARTHROPLASTY       reports that she has never smoked. She has never used smokeless tobacco. She reports that she does not drink alcohol or use drugs.  Allergies  Allergen Reactions  . Aspirin Other (See Comments)    G.I. Upset only    Family History  Problem Relation Age of Onset  . Cancer Unknown   . Heart disease Unknown     Prior to Admission medications   Medication Sig Start Date End Date Taking? Authorizing Provider  amLODipine (NORVASC) 2.5 MG tablet Take 2.5 mg by mouth every morning. Hold for BP < 100/60 or HR < 60   Yes [provider]  aspirin-acetaminophen-caffeine (EXCEDRIN MIGRAINE) 250-250-65 MG tablet Take 2 tablets by mouth every 12 (twelve) hours as needed for migraine.  08/10/17  Yes [provider]  baclofen (LIORESAL) 10 MG tablet Take 5 mg by mouth at bedtime. Take 1/2 of a 10mg  tablet.   Yes [provider]  benzocaine-menthol (CHLORASEPTIC) 6-10 MG lozenge Take 1 lozenge by mouth as needed for sore throat.   Yes [provider]  calcium carbonate (TUMS - DOSED IN MG ELEMENTAL CALCIUM) 500 MG chewable tablet Chew 2 tablets by mouth 3 (three) times daily.   Yes [provider]  Cranberry 475 MG CAPS Take 1 capsule by mouth 2 (two) times daily.    Yes [provider]  diclofenac sodium (VOLTAREN) 1 % GEL Apply 2 grams to neck topically three times a day for pain 05/15/17  Yes [provider]  docusate sodium (COLACE) 100 MG capsule Take 200 mg by mouth at bedtime.    Yes [provider]  dorzolamide (TRUSOPT) 2 % ophthalmic solution Place 1 drop into both eyes 3 (three) times daily.   Yes [provider]  escitalopram (LEXAPRO) 10 MG tablet Take 10 mg by mouth daily.   Yes [provider]  ferrous sulfate 220 (44 Fe) MG/5ML solution Give 5 ml by mouth in the evening for supplement   Yes [provider]  fluticasone (FLONASE) 50 MCG/ACT nasal spray Place 2 sprays into both nostrils at bedtime.   Yes [provider]  gabapentin (NEURONTIN) 100 MG capsule Take 100 mg by mouth at bedtime.   Yes [provider]  ipratropium-albuterol (DUONEB) 0.5-2.5 (3) MG/3ML SOLN Take 3 mLs by nebulization every 6 (six) hours as needed (wheezing/SOB for 14 days).   Yes [provider]  Linaclotide (LINZESS) 145 MCG CAPS capsule Take 145 mcg by mouth daily before breakfast.    Yes [provider]  loratadine (CLARITIN) 10 MG tablet Take 10 mg by mouth daily before breakfast.    Yes [provider]  Multiple Vitamins-Minerals (CERTAGEN PO) Take 1 tablet by mouth every evening.    Yes [provider]  Multiple Vitamins-Minerals (HAIR SKIN AND  NAILS FORMULA) TABS Take by mouth. Give 2 tablets by mouth daily   Yes [provider]  nystatin (MYCOSTATIN/NYSTOP) powder Apply 1 g topically 2 (two) times daily. Apply to perineal area topically two times a day for rash for 7 days.   Yes [provider]  nystatin cream (MYCOSTATIN) Apply 1 application topically 2 (two) times daily. Apply to buttocks topically two times a day for redness    Yes [provider]  omeprazole (PRILOSEC) 20 MG capsule Take 20 mg by mouth daily.   Yes [provider]  ondansetron (ZOFRAN) 8 MG tablet Take 8 mg by mouth every 8 (eight) hours as needed for nausea or vomiting.   Yes [provider]  Phenazopyridine HCl (AZO TABS PO) Take 1 tablet by mouth at bedtime.   Yes [provider]  polyethylene glycol (MIRALAX / GLYCOLAX) packet Take 17 g by mouth daily.    Yes [provider]  potassium chloride SA (K-DUR,KLOR-CON) 20 MEQ tablet Take 20-40 mEq by mouth 2 (two) times daily. Give 40 meq (2 tablets) in the morning and Give 20 meq (1 tablet) by mouth in the afternoon. 05/06/17  Yes [provider]  senna (SENOKOT) 8.6 MG TABS tablet Take 1 tablet (8.6 mg total) by mouth 2 (two) times daily. 01/05/14  Yes Barton Dubois, MD  simethicone (MYLICON) 144 MG chewable tablet Chew 125 mg by mouth every 6 (six) hours as needed for flatulence.   Yes [provider]  sodium chloride (OCEAN) 0.65 % SOLN nasal spray Place 1 spray into both nostrils as directed. Four times daily And every 24 hours as needed to moisturize nasal passages.   Yes [provider]  torsemide (DEMADEX) 20 MG tablet Take 20 mg by mouth daily.   Yes [provider]  Travoprost, BAK Free, (TRAVATAN) 0.004 % SOLN ophthalmic solution Place 1 drop into both eyes daily at 8 pm.    Yes [provider]  vitamin B-12 (CYANOCOBALAMIN) 500 MCG tablet Take 500 mcg by mouth every evening.    Yes [provider]     Physical Exam:  Constitutional: Elderly female in NAD, calm, comfortable Vitals:   09/15/17 1920 09/15/17 1925 09/15/17 1930 09/15/17 1935  BP: (!) 104/47 (!) 93/59 (!) 104/51   Pulse: 76 76 84   Resp: 20 17 (!) 28   Temp:      TempSrc:      SpO2: 96% 96% 91% 97%   Eyes: PERRL, lids and conjunctivae normal ENMT: Mucous membranes are dry. Posterior pharynx clear of any exudate or lesions. NG tube to suction with green gastric fluid present. Neck: normal, supple, no masses, no thyromegaly Respiratory: clear  to auscultation bilaterally, no wheezing, no crackles. Normal respiratory effort. No accessory muscle use.  Cardiovascular: Irregular irregular, no murmurs / rubs / gallops.  Right lower extremity edema noted. 2+ pedal pulses. No carotid bruits.  Abdomen: Mild distention, no tenderness, no masses palpated. No hepatosplenomegaly. Bowel sounds decreased. Musculoskeletal: no clubbing / cyanosis. No joint deformity upper and lower extremities. Good ROM, no contractures. Normal muscle tone.  Skin: Mild erythema noted of the right fifth toe Neurologic: CN 2-12 grossly intact. Sensation intact, DTR normal.  Patient able to move all 4 extremities Psychiatric: Alert and oriented x 2. Normal mood.     Labs on Admission: I have personally reviewed following labs and imaging studies  CBC: Recent Labs  Lab 09/15/17 1650 09/15/17 1654  WBC 7.8  --   NEUTROABS 5.8  --   HGB 10.7* 11.6*  HCT 33.7* 34.0*  MCV 106.3*  --   PLT 104*  --    Basic Metabolic Panel: Recent Labs  Lab 09/15/17 1650 09/15/17 1654  NA 140 140  K 4.0 4.0  CL 109 109  CO2 21*  --   GLUCOSE 175* 171*  BUN 43* 38*  CREATININE 2.17* 2.10*  CALCIUM 9.4  --    GFR: CrCl cannot be calculated (Unknown ideal weight.). Liver Function Tests: Recent Labs  Lab 09/15/17 1650  AST 19  ALT 12*  ALKPHOS 99  BILITOT 0.8  PROT 8.1  ALBUMIN 3.9   No results for input(s): LIPASE, AMYLASE in the last 168  hours. No results for input(s): AMMONIA in the last 168 hours. Coagulation Profile: No results for input(s): INR, PROTIME in the last 168 hours. Cardiac Enzymes: No results for input(s): CKTOTAL, CKMB, CKMBINDEX, TROPONINI in the last 168 hours. BNP (last 3 results) No results for input(s): PROBNP in the last 8760 hours. HbA1C: No results for input(s): HGBA1C in the last 72 hours. CBG: No results for input(s): GLUCAP in the last 168 hours. Lipid Profile: No results for input(s): CHOL, HDL, LDLCALC, TRIG, CHOLHDL, LDLDIRECT in the last 72 hours. Thyroid Function Tests: No results for input(s): TSH, T4TOTAL, FREET4, T3FREE, THYROIDAB in the last 72 hours. Anemia Panel: No results for input(s): VITAMINB12, FOLATE, FERRITIN, TIBC, IRON, RETICCTPCT in the last 72 hours. Urine analysis:    Component Value Date/Time   COLORURINE YELLOW 06/28/2017 1213   APPEARANCEUR TURBID (A) 06/28/2017 1213   LABSPEC 1.011 06/28/2017 1213   PHURINE 5.0 06/28/2017 1213   GLUCOSEU NEGATIVE 06/28/2017 1213   HGBUR MODERATE (A) 06/28/2017 1213   BILIRUBINUR NEGATIVE 06/28/2017 1213   KETONESUR NEGATIVE 06/28/2017 1213   PROTEINUR 100 (A) 06/28/2017 1213   UROBILINOGEN 0.2 03/09/2015 1352   NITRITE NEGATIVE 06/28/2017 1213   LEUKOCYTESUR MODERATE (A) 06/28/2017 1213   Sepsis Labs: No results found for this or any previous visit (from the past 240 hour(s)).   Radiological Exams on Admission: Ct Abdomen Pelvis Wo Contrast  Result Date: 09/15/2017 CLINICAL DATA:  Nausea, vomiting, high grade bowel obstruction, abdominal distension for 48 hours, history of chronic kidney disease, diabetes mellitus, atrial fibrillation, hypertension, Parkinson's, CHF, coronary artery disease EXAM: CT ABDOMEN AND PELVIS WITHOUT CONTRAST TECHNIQUE: Multidetector CT imaging of the abdomen and pelvis was performed following the standard protocol without IV contrast. Sagittal and coronal MPR images reconstructed from axial data  set. No oral contrast was administered COMPARISON:  06/28/2017 FINDINGS: Lower chest: Bibasilar atelectasis. Hepatobiliary: Gallbladder surgically absent.  Liver unremarkable. Pancreas: Atrophic pancreas without obvious mass Spleen: Normal appearance Adrenals/Urinary Tract:  Horseshoe kidney. Minimally prominent extrarenal pelves without definite hydronephrosis or hydroureter. Bladder unremarkable. Stomach/Bowel: Appendix not visualized but no pericecal inflammatory process seen. Marked gaseous distention of the sigmoid loop with tapered beak-like appearance of the sigmoid colon in the central pelvis consistent with sigmoid volvulus. Classic appearance on scout image. Sigmoid colon measures up to 11.7 cm transverse. Mild edema of sigmoid mesocolon. More proximally diffuse dilatation of the colon is identified to the cecal tip. Small bowel loops decompressed. No colonic wall thickening or evidence of perforation. Stomach unremarkable. Vascular/Lymphatic: Extensive atherosclerotic calcifications aorta as well as visceral, iliac, and coronary arteries. Aorta normal caliber. No adenopathy. Mitral annular calcification. Heart enlarged. Reproductive: Uterus surgically absent. Ovaries not visualized. No free air free fluid. Other: No free air or free fluid.  No hernia. Musculoskeletal: Diffuse osseous demineralization. LEFT hip prosthesis with associated beam hardening artifacts in pelvis. IMPRESSION: Sigmoid volvulus with significant gaseous distention of the sigmoid colon tapering to a beak-like appearance at the distal sigmoid colon. Associated edema of the sigmoid mesocolon and proximal colonic obstruction/dilatation. Sigmoid colon up to 11.7 cm diameter without evidence of perforation. Extensive atherosclerotic calcification including coronary arteries. Horseshoe kidney. Electronically Signed   By: Lavonia Dana M.D.   On: 09/15/2017 17:27   Dg Abd Portable 1 View  Result Date: 09/15/2017 CLINICAL DATA:  Nasogastric  tube placement EXAM: PORTABLE ABDOMEN - 1 VIEW COMPARISON:  CT abdomen and pelvis September 15, 2017 FINDINGS: Nasogastric tube tip is in the proximal stomach with the side port at the gastroesophageal junction. Evidence of sigmoid volvulus is again noted with multiple loops of dilated colon. There is marked dilatation of the sigmoid colon which is located in the left upper to mid abdomen. No free air. There is a total hip replacement on the left. IMPRESSION: Nasogastric tube tip in the proximal stomach with side port at gastroesophageal junction. Advise advancing nasogastric tube approximately 8 cm to insure that both tube tip and side port are well within the stomach. Colonic dilatation with evidence of sigmoid volvulus, unchanged from earlier in the day. No free air. Electronically Signed   By: Lowella Grip III M.D.   On: 09/15/2017 19:03    EKG: Independently reviewed.  Atrial fibrillation at 75 bpm  Assessment/Plan Sigmoid volvulus: Acute on chronic.  Patient presented with complaints of constipation.  Imaging studies reveal acute sigmoid volvulus.  Dr. Lyndel Safe ofgastroenterology performed emergent flex sigmoidoscopy for correction of sigmoid volvulus at bedside. - Admit to stepdown overnight - Strict intake and output - N.p.o. - IV fluids normal saline at 75 mL/h - Appreciate gastroenterology consultative services, follow-up for further recommend  Acute kidney injury on chronic kidney disease stage III: acute.  Patient baseline creatinine previously noted to be around 1, but she presents with a creatinine of 2.17 with BUN 43.  Elevated BUN to creatinine ratio-assess.  In all cause of symptoms.   - IV fluids as seen above - Recheck BMP in a.m.  Respiratory failure:  Patient noted not to be on oxygen at nursing facility.  Currently on 2-3 L maintaining O2 saturations. - Continuous pulse oximetry with nasal cannula oxygen as needed overnight  Headaches: Daughter reports patient having severe  headaches at nursing facility with pulsing veins.  Exact cause unknown at this time, but suspect possible causes include dehydration vs. hypoxia with reports of patient not always being on oxygen vs. other.  Patient last had a CT scan of the brain last on 05/2017 which showed no acute abnormalities.  Daughter request MRI of the brain.  Question if this is warranted.  Atrial fibrillation: Chronic.  Patient appears to be rate controlled at this time.  Patient is on aspirin. - Continue to monitor  Normocytic anemia: Hemoglobin 10.4 on admission. - Continue to monitor  Diastolic CHF: Patient appears to be hypovolemic at this time. - Strict intake and output  Thrombocytopenia: Chronic.  Platelet count noted to be 104 on admission. - Continue to monitor  Parkinson's dementia - Soft restraints  GERD - Protonix IV  DVT prophylaxis: SCDs  Code Status: DNR Family Communication: Discussed plan of care with the patient and family present at bedside Disposition Plan: Likely discharge home once medically stable Consults called: GI  Admission status: Observation  Norval Morton MD [facility] Pager [phone]   If 7PM-7AM, please contact night-coverage [email or web address] Password [facility]  09/15/2017, 7:54 PM

## 2017-09-15 NOTE — ED Notes (Signed)
Bed: WA02 Expected date:  Expected time:  Means of arrival:  Comments: 84 yo constipation

## 2017-09-15 NOTE — Op Note (Signed)
[facility] Patient Name: Maria Christian Procedure Date: 09/15/2017 MRN: [id number] Attending MD: Jackquline Denmark MD, MD Date of Birth: 1925/01/14 CSN: [id number] Age: 84 Admit Type: Inpatient Procedure:                Flexible Sigmoidoscopy Indications:              Volvulus Providers:                Jackquline Denmark MD, MD, Kingsley Plan, RN, Cherylynn Ridges, Technician Referring MD:             [facility] Medicines:                Midazolam 2 mg IV Complications:            No immediate complications. Estimated Blood Loss:     Estimated blood loss: none. Estimated blood loss:                            none. Procedure:                Pre-Anesthesia Assessment:                           - Prior to the procedure, a History and Physical                            was performed, and patient medications and                            allergies were reviewed. The patient is unable to                            give consent secondary to the patient being legally                            incompetent to consent. The risks and benefits of                            the procedure and the sedation options and risks                            were discussed with the patient's daughter. All                            questions were answered and informed consent was                            obtained. Patient identification and proposed                            procedure were verified by the physician in the                            procedure room. Prophylactic  Antibiotics: The                            patient does not require prophylactic antibiotics.                            Prior Anticoagulants: The patient has taken no                            previous anticoagulant or antiplatelet agents. ASA                            Grade Assessment: III - A patient with severe                            systemic disease. After reviewing the risks  and                            benefits, the patient was deemed in satisfactory                            condition to undergo the procedure. The anesthesia                            plan was to use moderate sedation / analgesia                            (conscious sedation). Immediately prior to                            administration of medications, the patient was                            re-assessed for adequacy to receive sedatives. The                            heart rate, respiratory rate, oxygen saturations,                            blood pressure, adequacy of pulmonary ventilation,                            and response to care were monitored throughout the                            procedure. The physical status of the patient was                            re-assessed after the procedure.                           After obtaining informed consent, the scope was  passed under direct vision. The [id number] ([id number])                            scope was introduced through the anus and advanced                            to the the left transverse colon. The flexible                            sigmoidoscopy was accomplished without difficulty.                            The patient tolerated the procedure well. The                            quality of the bowel preparation was poor. Scope In: 7:13:36 PM Scope Out: 7:22:14 PM Total Procedure Duration: 0 hours 8 minutes 38 seconds  Findings:      A volvulus, with viable appearing mucosa, was found in the sigmoid       colon. Decompression of the volvulus was attempted and was successful,       with complete decompression achieved. Estimated blood loss: none. Impression:               - Successful decompression of sigmoid volvulus.                           - Preparation of the colon was poor.                           - No specimens collected. Moderate Sedation:      Moderate (conscious)  sedation was administered by the endoscopy nurse       and supervised by the endoscopist. The following parameters were       monitored: oxygen saturation, heart rate, blood pressure, and response       to care. Total physician intraservice time was 12 minutes. Recommendation:           - Return patient to hospital ward for ongoing care.                           - Since there was significant stool in the colon,                            would recommend starting MiraLAX 17 g thru bid NG.                           - Monitor eletrolytes. Procedure Code(s):        --- Professional ---                           [phone], Sigmoidoscopy, flexible; with decompression                            (for pathologic distention) (eg, volvulus,  megacolon), including placement of decompression                            tube, when performed                           99152, 59, Moderate sedation services provided by                            the same physician or other qualified health care                            professional performing the diagnostic or                            therapeutic service that the sedation supports,                            requiring the presence of an independent trained                            observer to assist in the monitoring of the                            patient's level of consciousness and physiological                            status; initial 15 minutes of intraservice time,                            patient age 16 years or older Diagnosis Code(s):        --- Professional ---                           K56.2, Volvulus CPT copyright 2017 [employer]. All rights reserved. The codes documented in this report are preliminary and upon coder review may  be revised to meet current compliance requirements. Jackquline Denmark MD, MD 09/15/2017 7:55:35 PM This report has been signed electronically. Number of Addenda: 0

## 2017-09-16 ENCOUNTER — Encounter (HOSPITAL_COMMUNITY): Payer: Self-pay | Admitting: *Deleted

## 2017-09-16 ENCOUNTER — Observation Stay (HOSPITAL_COMMUNITY): Payer: Medicare Other

## 2017-09-16 ENCOUNTER — Other Ambulatory Visit: Payer: Self-pay

## 2017-09-16 DIAGNOSIS — I5032 Chronic diastolic (congestive) heart failure: Secondary | ICD-10-CM | POA: Diagnosis present

## 2017-09-16 DIAGNOSIS — Q438 Other specified congenital malformations of intestine: Secondary | ICD-10-CM | POA: Diagnosis not present

## 2017-09-16 DIAGNOSIS — E860 Dehydration: Secondary | ICD-10-CM | POA: Diagnosis present

## 2017-09-16 DIAGNOSIS — Z7189 Other specified counseling: Secondary | ICD-10-CM | POA: Diagnosis not present

## 2017-09-16 DIAGNOSIS — E1142 Type 2 diabetes mellitus with diabetic polyneuropathy: Secondary | ICD-10-CM | POA: Diagnosis present

## 2017-09-16 DIAGNOSIS — K633 Ulcer of intestine: Secondary | ICD-10-CM | POA: Diagnosis present

## 2017-09-16 DIAGNOSIS — R609 Edema, unspecified: Secondary | ICD-10-CM | POA: Diagnosis not present

## 2017-09-16 DIAGNOSIS — E876 Hypokalemia: Secondary | ICD-10-CM | POA: Diagnosis present

## 2017-09-16 DIAGNOSIS — E43 Unspecified severe protein-calorie malnutrition: Secondary | ICD-10-CM | POA: Diagnosis present

## 2017-09-16 DIAGNOSIS — K5939 Other megacolon: Secondary | ICD-10-CM | POA: Diagnosis present

## 2017-09-16 DIAGNOSIS — D696 Thrombocytopenia, unspecified: Secondary | ICD-10-CM | POA: Diagnosis present

## 2017-09-16 DIAGNOSIS — K5732 Diverticulitis of large intestine without perforation or abscess without bleeding: Secondary | ICD-10-CM | POA: Diagnosis present

## 2017-09-16 DIAGNOSIS — D649 Anemia, unspecified: Secondary | ICD-10-CM | POA: Diagnosis not present

## 2017-09-16 DIAGNOSIS — G20 Parkinson's disease: Secondary | ICD-10-CM | POA: Diagnosis present

## 2017-09-16 DIAGNOSIS — K562 Volvulus: Secondary | ICD-10-CM | POA: Diagnosis present

## 2017-09-16 DIAGNOSIS — E872 Acidosis: Secondary | ICD-10-CM | POA: Diagnosis present

## 2017-09-16 DIAGNOSIS — I482 Chronic atrial fibrillation: Secondary | ICD-10-CM | POA: Diagnosis not present

## 2017-09-16 DIAGNOSIS — N189 Chronic kidney disease, unspecified: Secondary | ICD-10-CM | POA: Diagnosis not present

## 2017-09-16 DIAGNOSIS — N183 Chronic kidney disease, stage 3 (moderate): Secondary | ICD-10-CM | POA: Diagnosis present

## 2017-09-16 DIAGNOSIS — K219 Gastro-esophageal reflux disease without esophagitis: Secondary | ICD-10-CM | POA: Diagnosis not present

## 2017-09-16 DIAGNOSIS — E1122 Type 2 diabetes mellitus with diabetic chronic kidney disease: Secondary | ICD-10-CM | POA: Diagnosis present

## 2017-09-16 DIAGNOSIS — F0281 Dementia in other diseases classified elsewhere with behavioral disturbance: Secondary | ICD-10-CM | POA: Diagnosis present

## 2017-09-16 DIAGNOSIS — Z6826 Body mass index (BMI) 26.0-26.9, adult: Secondary | ICD-10-CM | POA: Diagnosis not present

## 2017-09-16 DIAGNOSIS — Z515 Encounter for palliative care: Secondary | ICD-10-CM | POA: Diagnosis not present

## 2017-09-16 DIAGNOSIS — K559 Vascular disorder of intestine, unspecified: Secondary | ICD-10-CM | POA: Diagnosis present

## 2017-09-16 DIAGNOSIS — L89322 Pressure ulcer of left buttock, stage 2: Secondary | ICD-10-CM | POA: Diagnosis present

## 2017-09-16 DIAGNOSIS — J9621 Acute and chronic respiratory failure with hypoxia: Secondary | ICD-10-CM | POA: Diagnosis present

## 2017-09-16 DIAGNOSIS — I130 Hypertensive heart and chronic kidney disease with heart failure and stage 1 through stage 4 chronic kidney disease, or unspecified chronic kidney disease: Secondary | ICD-10-CM | POA: Diagnosis present

## 2017-09-16 DIAGNOSIS — K6389 Other specified diseases of intestine: Secondary | ICD-10-CM | POA: Diagnosis not present

## 2017-09-16 DIAGNOSIS — R1031 Right lower quadrant pain: Secondary | ICD-10-CM | POA: Diagnosis present

## 2017-09-16 DIAGNOSIS — N179 Acute kidney failure, unspecified: Secondary | ICD-10-CM | POA: Diagnosis present

## 2017-09-16 DIAGNOSIS — Z66 Do not resuscitate: Secondary | ICD-10-CM | POA: Diagnosis present

## 2017-09-16 LAB — CBC
HCT: 30.8 % — ABNORMAL LOW (ref 36.0–46.0)
Hemoglobin: 9.7 g/dL — ABNORMAL LOW (ref 12.0–15.0)
MCH: 34.2 pg — ABNORMAL HIGH (ref 26.0–34.0)
MCHC: 31.5 g/dL (ref 30.0–36.0)
MCV: 108.5 fL — AB (ref 78.0–100.0)
Platelets: 95 10*3/uL — ABNORMAL LOW (ref 150–400)
RBC: 2.84 MIL/uL — AB (ref 3.87–5.11)
RDW: 14.4 % (ref 11.5–15.5)
WBC: 9.1 10*3/uL (ref 4.0–10.5)

## 2017-09-16 LAB — BASIC METABOLIC PANEL
ANION GAP: 8 (ref 5–15)
BUN: 41 mg/dL — ABNORMAL HIGH (ref 6–20)
CALCIUM: 8.4 mg/dL — AB (ref 8.9–10.3)
CO2: 21 mmol/L — ABNORMAL LOW (ref 22–32)
Chloride: 110 mmol/L (ref 101–111)
Creatinine, Ser: 2.07 mg/dL — ABNORMAL HIGH (ref 0.44–1.00)
GFR, EST AFRICAN AMERICAN: 23 mL/min — AB (ref >60)
GFR, EST NON AFRICAN AMERICAN: 20 mL/min — AB (ref >60)
Glucose, Bld: 179 mg/dL — ABNORMAL HIGH (ref 65–99)
POTASSIUM: 4.4 mmol/L (ref 3.5–5.1)
Sodium: 139 mmol/L (ref 135–145)

## 2017-09-16 LAB — GLUCOSE, CAPILLARY: GLUCOSE-CAPILLARY: 144 mg/dL — AB (ref 65–99)

## 2017-09-16 MED ORDER — HALOPERIDOL LACTATE 5 MG/ML IJ SOLN: 1.0000 mg | Freq: Four times a day (QID) | INTRAMUSCULAR | Status: DC | PRN

## 2017-09-16 MED ORDER — POLYETHYLENE GLYCOL 3350 17 G PO PACK
17.0000 g | PACK | Freq: Two times a day (BID) | ORAL | Status: DC
  Administered 2017-09-16 – 2017-09-18 (×4): 17 g via ORAL
  Filled 2017-09-16 (×3): qty 1

## 2017-09-16 MED ORDER — SODIUM CHLORIDE 0.9 % IV BOLUS
500.0000 mL | Freq: Once | INTRAVENOUS | Status: AC
  Administered 2017-09-16: 500 mL via INTRAVENOUS

## 2017-09-16 MED ORDER — QUETIAPINE FUMARATE 25 MG PO TABS
25.0000 mg | ORAL_TABLET | Freq: Every evening | ORAL | Status: DC | PRN
  Administered 2017-09-17 – 2017-09-24 (×2): 25 mg via ORAL
  Filled 2017-09-16 (×2): qty 1

## 2017-09-16 NOTE — Progress Notes (Addendum)
Patient ID: Maria Christian, female   DOB: 07-08-1924, 84 y.o.   MRN: [id number]    Progress Note   Subjective    In god spirits this am - says she feels better - no abdominal pain Had large BM earlier this am  Tolerating NG   Objective   Vital signs in last 24 hours: Temp:  [98.2 F (36.8 C)-98.8 F (37.1 C)] 98.3 F (36.8 C) (04/14 0752) Pulse Rate:  [55-93] 55 (04/14 0800) Resp:  [13-28] 20 (04/14 0800) BP: (87-141)/(35-65) 119/55 (04/14 0800) SpO2:  [90 %-100 %] 91 % (04/14 0800) Weight:  [156 lb 8.4 oz (71 kg)] 156 lb 8.4 oz (71 kg) (04/13 2200)   General:   Elderly  white female in NAD Heart:  Regular rate and rhythm; no murmurs Lungs: Respirations even and unlabored, lungs CTA bilaterally Abdomen:  ,  Bs somewhat tinkling ,high pitched, distended but not tense , non tender  Extremities:  Without edema. Neurologic:  Alert and oriented,  grossly normal neurologically. Psych:  Cooperative. Normal mood and affect.  Intake/Output from previous day: 04/13 0701 - 04/14 0700 In: 1437.5 [I.V.:437.5; IV Piggyback:1000] Out: -  Intake/Output this shift: Total I/O In: 690 [P.O.:240; I.V.:450] Out: 0   Lab Results: Recent Labs    09/15/17 1650 09/15/17 1654 09/16/17 0328  WBC 7.8  --  9.1  HGB 10.7* 11.6* 9.7*  HCT 33.7* 34.0* 30.8*  PLT 104*  --  95*   BMET Recent Labs    09/15/17 1650 09/15/17 1654 09/16/17 0328  NA 140 140 139  K 4.0 4.0 4.4  CL 109 109 110  CO2 21*  --  21*  GLUCOSE 175* 171* 179*  BUN 43* 38* 41*  CREATININE 2.17* 2.10* 2.07*  CALCIUM 9.4  --  8.4*   LFT Recent Labs    09/15/17 1650  PROT 8.1  ALBUMIN 3.9  AST 19  ALT 12*  ALKPHOS 99  BILITOT 0.8   PT/INR No results for input(s): LABPROT, INR in the last 72 hours.  Studies/Results: Ct Abdomen Pelvis Wo Contrast  Result Date: 09/15/2017 CLINICAL DATA:  Nausea, vomiting, high grade bowel obstruction, abdominal distension for 48 hours, history of chronic kidney disease,  diabetes mellitus, atrial fibrillation, hypertension, Parkinson's, CHF, coronary artery disease EXAM: CT ABDOMEN AND PELVIS WITHOUT CONTRAST TECHNIQUE: Multidetector CT imaging of the abdomen and pelvis was performed following the standard protocol without IV contrast. Sagittal and coronal MPR images reconstructed from axial data set. No oral contrast was administered COMPARISON:  06/28/2017 FINDINGS: Lower chest: Bibasilar atelectasis. Hepatobiliary: Gallbladder surgically absent.  Liver unremarkable. Pancreas: Atrophic pancreas without obvious mass Spleen: Normal appearance Adrenals/Urinary Tract: Horseshoe kidney. Minimally prominent extrarenal pelves without definite hydronephrosis or hydroureter. Bladder unremarkable. Stomach/Bowel: Appendix not visualized but no pericecal inflammatory process seen. Marked gaseous distention of the sigmoid loop with tapered beak-like appearance of the sigmoid colon in the central pelvis consistent with sigmoid volvulus. Classic appearance on scout image. Sigmoid colon measures up to 11.7 cm transverse. Mild edema of sigmoid mesocolon. More proximally diffuse dilatation of the colon is identified to the cecal tip. Small bowel loops decompressed. No colonic wall thickening or evidence of perforation. Stomach unremarkable. Vascular/Lymphatic: Extensive atherosclerotic calcifications aorta as well as visceral, iliac, and coronary arteries. Aorta normal caliber. No adenopathy. Mitral annular calcification. Heart enlarged. Reproductive: Uterus surgically absent. Ovaries not visualized. No free air free fluid. Other: No free air or free fluid.  No hernia. Musculoskeletal: Diffuse osseous demineralization. LEFT hip  prosthesis with associated beam hardening artifacts in pelvis. IMPRESSION: Sigmoid volvulus with significant gaseous distention of the sigmoid colon tapering to a beak-like appearance at the distal sigmoid colon. Associated edema of the sigmoid mesocolon and proximal colonic  obstruction/dilatation. Sigmoid colon up to 11.7 cm diameter without evidence of perforation. Extensive atherosclerotic calcification including coronary arteries. Horseshoe kidney. Electronically Signed   By: Lavonia Dana M.D.   On: 09/15/2017 17:27   Dg Abd Portable 1v  Result Date: 09/16/2017 CLINICAL DATA:  Nasogastric tube placement.  Sigmoid volvulus. EXAM: PORTABLE ABDOMEN - 1 VIEW COMPARISON:  09/15/2017 FINDINGS: Nasogastric tube is seen with tip in the proximal stomach. Moderate colonic dilatation again seen, but mildly decreased since previous study. IMPRESSION: Nasogastric tube in appropriate position. Mild decrease in colonic dilatation. Electronically Signed   By: Earle Gell M.D.   On: 09/16/2017 07:52   Dg Abd Portable 1 View  Result Date: 09/15/2017 CLINICAL DATA:  Nasogastric tube placement EXAM: PORTABLE ABDOMEN - 1 VIEW COMPARISON:  Portable exam 1941 hours compared to 1836 hours as well as earlier CT of 09/15/2017 FINDINGS: Nasogastric tube coiled in proximal stomach. Again identified marked gaseous distention of the sigmoid loop compatible with sigmoid volvulus. Mild gas distention of remainder of colon. Bones demineralized. IMPRESSION: Nasogastric tube coiled in proximal stomach. Marked gaseous distention of sigmoid loop consistent with sigmoid volvulus again identified. Electronically Signed   By: Lavonia Dana M.D.   On: 09/15/2017 20:18   Dg Abd Portable 1 View  Result Date: 09/15/2017 CLINICAL DATA:  Nasogastric tube placement EXAM: PORTABLE ABDOMEN - 1 VIEW COMPARISON:  CT abdomen and pelvis September 15, 2017 FINDINGS: Nasogastric tube tip is in the proximal stomach with the side port at the gastroesophageal junction. Evidence of sigmoid volvulus is again noted with multiple loops of dilated colon. There is marked dilatation of the sigmoid colon which is located in the left upper to mid abdomen. No free air. There is a total hip replacement on the left. IMPRESSION: Nasogastric tube  tip in the proximal stomach with side port at gastroesophageal junction. Advise advancing nasogastric tube approximately 8 cm to insure that both tube tip and side port are well within the stomach. Colonic dilatation with evidence of sigmoid volvulus, unchanged from earlier in the day. No free air. Electronically Signed   By: Lowella Grip III M.D.   On: 09/15/2017 19:03       Assessment / Plan:     #1 84 yo female with recurrent sigmoid volvulus- stable s/p  Colonic decompression last pm Improved but still distended- KUB shows decreased colonic distention Pt had had several admissions for recurrent volvulus over the past couple years  Continue Miralax via NG BID  and intermittent NG  Today Repeat abdominal l films in am       Contact  Amy [place], P.A.-C               [phone]     Attending physician's note   I have reviewed the chart and examined the patient. I agree with the Advanced Practitioner's note, impression and recommendations.   84 year old with a recurrent sigmoid volvulus status post endoscopic decompression 4/13, had significant stool in the colon, on MiraLAX through the NG tube.  Plan: continue MiraLAX until she is clear, serial  x-ray KUBs, avoid pain medications. Will need to continue MiraLAX as an outpatient. Family refusing surgery.  Carmell Austria, MD

## 2017-09-16 NOTE — Plan of Care (Signed)
Pt states she feels much better and denies any abdominal pain after bowel movement

## 2017-09-16 NOTE — Progress Notes (Signed)
Pt is alert and oriented to self, place and why she is here at [facility]. Pt is also able to tell me who the president is but not the correct year. Pt denies pain at this time. Mittens are on pt's bilateral hands and bed is in low position with alarm on.

## 2017-09-16 NOTE — Progress Notes (Signed)
NG clamped per Dr Lyndel Safe who is at bedside

## 2017-09-16 NOTE — Progress Notes (Signed)
[facility] PROGRESS NOTE    Progress Note  Maria Christian  EYC:[id number] DOB: 12-09-1924 DOA: 09/15/2017 PCP: [name], DO     Brief Narrative:   Maria Christian is an 84 y.o. female past medical history of hypertension A. fib chronic diastolic heart failure recurrent sigmoid volvulus on 5 separate occasions since 2014, with the patient's family not wanting her to go surgical intervention chronic respiratory failure who presents with abdominal pain nausea and vomiting started 3 days prior to admission  Assessment/Plan:   Acute on Sigmoid volvulus [facility]): Gastric urology was consulted to perform a flex sigmoidoscopy at bedside with a successful decompression. He also recommended MiraLAX as there was a large amount of stool burden. Continue strict I's and O's and IV hydration.  Acute kidney injury on chronic disease stage III: Likely prerenal in etiology. Baseline creatinine around 1 on admission 2.1, cont.  IV fluid hydration recheck a basic metabolic panel in the morning. Over 500 cc bolus.  She seems significantly dehydrated on physical exam  Acute respiratory failure with hypoxia: Nursing home had to be placed on nasal cannula we will continue to wean oxygen off.  Headache: Likely multifactorial due to dehydration and hypoxia. CT scan of the head on 2018 showed no acute abnormalities.  Chronic atrial fibrillation: Rate controlled continue aspirin  Normocytic anemia: Hemoglobin 10.4 on admission, baseline hemoglobin around 9.2-9.4 I expect this to drop as we continue to hydrate her as she is probably hemoconcentrated.  Chronic diastolic heart failure: Appears to be hypovolemic continue strict I's and O's and IV fluid hydration.  Chronic thrombocytopenia: Appears to be stable.  Dementia with behavioral disturbance: We will use Seroquel at night IV Haldol as needed. She remove her NG tube and her IV line overnight we will replace these.  DVT prophylaxis:  SCD Family Communication:daughter  Disposition Plan/Barrier to D/C: home in 4 days Code Status:     Code Status Orders  (From admission, onward)        Start     Ordered   09/15/17 2059  Do not attempt resuscitation (DNR)  Continuous    Question Answer Comment  In the event of cardiac or respiratory ARREST Do not call a "code blue"   In the event of cardiac or respiratory ARREST Do not perform Intubation, CPR, defibrillation or ACLS   In the event of cardiac or respiratory ARREST Use medication by any route, position, wound care, and other measures to relive pain and suffering. May use oxygen, suction and manual treatment of airway obstruction as needed for comfort.      09/15/17 2058    Code Status History    Date Active Date Inactive Code Status Order ID Comments User Context   06/28/2017 1955 07/08/2017 2114 DNR [id number]  Oswald Hillock, MD Inpatient   04/17/2016 2329 04/24/2016 1649 DNR [id number]  Toy Baker, MD Inpatient   12/20/2015 1854 12/23/2015 1629 DNR [id number]  Florencia Reasons, MD Inpatient   03/13/2015 1940 03/15/2015 2009 DNR [id number]  Oswald Hillock, MD Inpatient   03/11/2015 1312 03/13/2015 1940 Full Code [id number]  Oswald Hillock, MD Inpatient   02/26/2015 2314 02/28/2015 1959 Full Code [id number]  Dianne Dun, NP Inpatient   02/26/2015 2134 02/26/2015 2314 DNR [id number]  Toy Baker, MD Inpatient   12/28/2013 0054 01/05/2014 2109 DNR [id number]  Jonetta Osgood, MD ED   10/28/2013 2007 12/28/2013 0054 Full Code [id number]  Hennie Duos, MD Outpatient   10/10/2013 0006 10/23/2013 2030  Full Code [id number]  Albertine Patricia, MD ED   07/29/2013 1633 10/10/2013 0006 Full Code [id number]  Hennie Duos, MD Outpatient   07/24/2013 1028 07/28/2013 2116 DNR [id number]  Rise Patience, MD ED   05/24/2013 0241 06/02/2013 2224 DNR [id number]  Allie Bossier, MD ED   07/13/2011 1726 07/20/2011 1801 Full Code [id number]  Roe Rutherford, RN Inpatient   05/22/2011  2103 05/24/2011 1443 Full Code [id number]  Pecola Leisure, RN Inpatient        IV Access:    Peripheral IV   Procedures and diagnostic studies:   Ct Abdomen Pelvis Wo Contrast  Result Date: 09/15/2017 CLINICAL DATA:  Nausea, vomiting, high grade bowel obstruction, abdominal distension for 48 hours, history of chronic kidney disease, diabetes mellitus, atrial fibrillation, hypertension, Parkinson's, CHF, coronary artery disease EXAM: CT ABDOMEN AND PELVIS WITHOUT CONTRAST TECHNIQUE: Multidetector CT imaging of the abdomen and pelvis was performed following the standard protocol without IV contrast. Sagittal and coronal MPR images reconstructed from axial data set. No oral contrast was administered COMPARISON:  06/28/2017 FINDINGS: Lower chest: Bibasilar atelectasis. Hepatobiliary: Gallbladder surgically absent.  Liver unremarkable. Pancreas: Atrophic pancreas without obvious mass Spleen: Normal appearance Adrenals/Urinary Tract: Horseshoe kidney. Minimally prominent extrarenal pelves without definite hydronephrosis or hydroureter. Bladder unremarkable. Stomach/Bowel: Appendix not visualized but no pericecal inflammatory process seen. Marked gaseous distention of the sigmoid loop with tapered beak-like appearance of the sigmoid colon in the central pelvis consistent with sigmoid volvulus. Classic appearance on scout image. Sigmoid colon measures up to 11.7 cm transverse. Mild edema of sigmoid mesocolon. More proximally diffuse dilatation of the colon is identified to the cecal tip. Small bowel loops decompressed. No colonic wall thickening or evidence of perforation. Stomach unremarkable. Vascular/Lymphatic: Extensive atherosclerotic calcifications aorta as well as visceral, iliac, and coronary arteries. Aorta normal caliber. No adenopathy. Mitral annular calcification. Heart enlarged. Reproductive: Uterus surgically absent. Ovaries not visualized. No free air free fluid. Other: No free air or free  fluid.  No hernia. Musculoskeletal: Diffuse osseous demineralization. LEFT hip prosthesis with associated beam hardening artifacts in pelvis. IMPRESSION: Sigmoid volvulus with significant gaseous distention of the sigmoid colon tapering to a beak-like appearance at the distal sigmoid colon. Associated edema of the sigmoid mesocolon and proximal colonic obstruction/dilatation. Sigmoid colon up to 11.7 cm diameter without evidence of perforation. Extensive atherosclerotic calcification including coronary arteries. Horseshoe kidney. Electronically Signed   By: Lavonia Dana M.D.   On: 09/15/2017 17:27   Dg Abd Portable 1 View  Result Date: 09/15/2017 CLINICAL DATA:  Nasogastric tube placement EXAM: PORTABLE ABDOMEN - 1 VIEW COMPARISON:  Portable exam 1941 hours compared to 1836 hours as well as earlier CT of 09/15/2017 FINDINGS: Nasogastric tube coiled in proximal stomach. Again identified marked gaseous distention of the sigmoid loop compatible with sigmoid volvulus. Mild gas distention of remainder of colon. Bones demineralized. IMPRESSION: Nasogastric tube coiled in proximal stomach. Marked gaseous distention of sigmoid loop consistent with sigmoid volvulus again identified. Electronically Signed   By: Lavonia Dana M.D.   On: 09/15/2017 20:18   Dg Abd Portable 1 View  Result Date: 09/15/2017 CLINICAL DATA:  Nasogastric tube placement EXAM: PORTABLE ABDOMEN - 1 VIEW COMPARISON:  CT abdomen and pelvis September 15, 2017 FINDINGS: Nasogastric tube tip is in the proximal stomach with the side port at the gastroesophageal junction. Evidence of sigmoid volvulus is again noted with multiple loops of dilated colon. There is marked dilatation of the sigmoid colon which is located in  the left upper to mid abdomen. No free air. There is a total hip replacement on the left. IMPRESSION: Nasogastric tube tip in the proximal stomach with side port at gastroesophageal junction. Advise advancing nasogastric tube approximately 8 cm  to insure that both tube tip and side port are well within the stomach. Colonic dilatation with evidence of sigmoid volvulus, unchanged from earlier in the day. No free air. Electronically Signed   By: Lowella Grip III M.D.   On: 09/15/2017 19:03     Medical Consultants:    None.  Anti-Infectives:   None  Subjective:    Maria Christian patient relates her abdominal pain is improved she denies any further headaches. Objective:    Vitals:   09/15/17 2314 09/16/17 0000 09/16/17 0354 09/16/17 0400  BP:  (!) 120/35  (!) 113/59  Pulse:  83  61  Resp:  (!) 25    Temp: 98.2 F (36.8 C)  98.8 F (37.1 C)   TempSrc: Oral  Oral   SpO2:  95%  91%  Weight:        Intake/Output Summary (Last 24 hours) at 09/16/2017 0753 Last data filed at 09/16/2017 0400 Gross per 24 hour  Intake 1437.5 ml  Output -  Net 1437.5 ml   Filed Weights   09/15/17 2200  Weight: 71 kg (156 lb 8.4 oz)    Exam: General exam: In no acute distress, extremely dry mucous membranes. Respiratory system: Good air movement and clear to auscultation. Cardiovascular system: S1 & S2 heard, RRR.  Gastrointestinal system: Abdomen is nondistended, soft and nontender.  Central nervous system: Alert and oriented. No focal neurological deficits. Extremities: No pedal edema. Skin: No rashes, lesions or ulcers    Data Reviewed:    Labs: Basic Metabolic Panel: Recent Labs  Lab 09/15/17 1650 09/15/17 1654 09/16/17 0328  NA 140 140 139  K 4.0 4.0 4.4  CL 109 109 110  CO2 21*  --  21*  GLUCOSE 175* 171* 179*  BUN 43* 38* 41*  CREATININE 2.17* 2.10* 2.07*  CALCIUM 9.4  --  8.4*   GFR Estimated Creatinine Clearance: 17.6 mL/min (A) (by C-G formula based on SCr of 2.07 mg/dL (H)). Liver Function Tests: Recent Labs  Lab 09/15/17 1650  AST 19  ALT 12*  ALKPHOS 99  BILITOT 0.8  PROT 8.1  ALBUMIN 3.9   No results for input(s): LIPASE, AMYLASE in the last 168 hours. No results for input(s):  AMMONIA in the last 168 hours. Coagulation profile No results for input(s): INR, PROTIME in the last 168 hours.  CBC: Recent Labs  Lab 09/15/17 1650 09/15/17 1654 09/16/17 0328  WBC 7.8  --  9.1  NEUTROABS 5.8  --   --   HGB 10.7* 11.6* 9.7*  HCT 33.7* 34.0* 30.8*  MCV 106.3*  --  108.5*  PLT 104*  --  95*   Cardiac Enzymes: No results for input(s): CKTOTAL, CKMB, CKMBINDEX, TROPONINI in the last 168 hours. BNP (last 3 results) No results for input(s): PROBNP in the last 8760 hours. CBG: No results for input(s): GLUCAP in the last 168 hours. D-Dimer: No results for input(s): DDIMER in the last 72 hours. Hgb A1c: No results for input(s): HGBA1C in the last 72 hours. Lipid Profile: No results for input(s): CHOL, HDL, LDLCALC, TRIG, CHOLHDL, LDLDIRECT in the last 72 hours. Thyroid function studies: No results for input(s): TSH, T4TOTAL, T3FREE, THYROIDAB in the last 72 hours.  Invalid input(s): FREET3 Anemia work up:  No results for input(s): VITAMINB12, FOLATE, FERRITIN, TIBC, IRON, RETICCTPCT in the last 72 hours. Sepsis Labs: Recent Labs  Lab 09/15/17 1650 09/15/17 1655 09/16/17 0328  WBC 7.8  --  9.1  LATICACIDVEN  --  1.11  --    Microbiology Recent Results (from the past 240 hour(s))  MRSA PCR Screening     Status: None   Collection Time: 09/15/17 10:10 PM  Result Value Ref Range Status   MRSA by PCR NEGATIVE NEGATIVE Final    Comment:        The GeneXpert MRSA Assay (FDA approved for NASAL specimens only), is one component of a comprehensive MRSA colonization surveillance program. It is not intended to diagnose MRSA infection nor to guide or monitor treatment for MRSA infections. Performed at [facility], [place] [address], [place],  [address]      Medications:   . diclofenac sodium  2 g Topical BID  . dorzolamide  1 drop Both Eyes TID  . latanoprost  1 drop Both Eyes QHS  . lidocaine  15 mL Mouth/Throat Once  .  nystatin  1 g Topical BID  . pantoprazole (PROTONIX) IV  40 mg Intravenous Q12H   Continuous Infusions: . sodium chloride 75 mL/hr at 09/15/17 2210      LOS: 0 days   Charlynne Cousins  [facility] Pager [phone]  *Please refer to [email or web address], password [email or web address] to get updated schedule on who will round on this patient, as hospitalists switch teams weekly. If 7PM-7AM, please contact night-coverage at [email or web address], password [email or web address] for any overnight needs.  09/16/2017, 7:53 AM

## 2017-09-16 NOTE — Progress Notes (Signed)
Pt has hearing aid in right ear, daughter at bedside and states she brought pt's left hearing aid and glasses and will place those on pt. Daughter states she "slipped" in ED last night when she was there with her daughter. Her left foot and ankle are very swollen and bruised. She states she did not tell anyone about it and did not have it looked at while in the ED with her mother. Daughter states she is going to go to ED now to report it and have it looked at.

## 2017-09-17 ENCOUNTER — Inpatient Hospital Stay (HOSPITAL_COMMUNITY): Payer: Medicare Other

## 2017-09-17 ENCOUNTER — Telehealth: Payer: Self-pay | Admitting: Internal Medicine

## 2017-09-17 LAB — BASIC METABOLIC PANEL
Anion gap: 8 (ref 5–15)
BUN: 36 mg/dL — AB (ref 6–20)
CO2: 18 mmol/L — AB (ref 22–32)
CREATININE: 1.7 mg/dL — AB (ref 0.44–1.00)
Calcium: 8.5 mg/dL — ABNORMAL LOW (ref 8.9–10.3)
Chloride: 115 mmol/L — ABNORMAL HIGH (ref 101–111)
GFR calc Af Amer: 29 mL/min — ABNORMAL LOW (ref >60)
GFR calc non Af Amer: 25 mL/min — ABNORMAL LOW (ref >60)
GLUCOSE: 128 mg/dL — AB (ref 65–99)
Potassium: 3.3 mmol/L — ABNORMAL LOW (ref 3.5–5.1)
Sodium: 141 mmol/L (ref 135–145)

## 2017-09-17 LAB — CBC WITH DIFFERENTIAL/PLATELET
Basophils Absolute: 0 10*3/uL (ref 0.0–0.1)
Basophils Relative: 0 %
EOS ABS: 0.1 10*3/uL (ref 0.0–0.7)
EOS PCT: 2 %
HCT: 30.4 % — ABNORMAL LOW (ref 36.0–46.0)
Hemoglobin: 9.4 g/dL — ABNORMAL LOW (ref 12.0–15.0)
LYMPHS ABS: 1 10*3/uL (ref 0.7–4.0)
Lymphocytes Relative: 16 %
MCH: 33.9 pg (ref 26.0–34.0)
MCHC: 30.9 g/dL (ref 30.0–36.0)
MCV: 109.7 fL — ABNORMAL HIGH (ref 78.0–100.0)
MONOS PCT: 12 %
Monocytes Absolute: 0.7 10*3/uL (ref 0.1–1.0)
Neutro Abs: 4.4 10*3/uL (ref 1.7–7.7)
Neutrophils Relative %: 70 %
PLATELETS: 81 10*3/uL — AB (ref 150–400)
RBC: 2.77 MIL/uL — ABNORMAL LOW (ref 3.87–5.11)
RDW: 14.7 % (ref 11.5–15.5)
WBC: 6.3 10*3/uL (ref 4.0–10.5)

## 2017-09-17 MED ORDER — PHENOL 1.4 % MT LIQD
1.0000 | OROMUCOSAL | Status: DC | PRN
  Filled 2017-09-17: qty 177

## 2017-09-17 MED ORDER — AMLODIPINE BESYLATE 5 MG PO TABS
2.5000 mg | ORAL_TABLET | Freq: Every day | ORAL | Status: DC
  Administered 2017-09-17 – 2017-09-20 (×4): 2.5 mg via ORAL
  Filled 2017-09-17 (×4): qty 1

## 2017-09-17 MED ORDER — ASPIRIN-ACETAMINOPHEN-CAFFEINE 250-250-65 MG PO TABS
2.0000 | ORAL_TABLET | Freq: Two times a day (BID) | ORAL | Status: DC | PRN
  Administered 2017-09-17: 2 via ORAL
  Filled 2017-09-17 (×3): qty 2

## 2017-09-17 MED ORDER — FAMOTIDINE IN NACL 20-0.9 MG/50ML-% IV SOLN
20.0000 mg | INTRAVENOUS | Status: DC
  Administered 2017-09-17 – 2017-09-25 (×9): 20 mg via INTRAVENOUS
  Filled 2017-09-17 (×9): qty 50

## 2017-09-17 MED ORDER — SODIUM CHLORIDE 0.45 % IV SOLN
INTRAVENOUS | Status: DC
  Administered 2017-09-17: 10:00:00 via INTRAVENOUS
  Filled 2017-09-17 (×3): qty 1000

## 2017-09-17 NOTE — Telephone Encounter (Signed)
Patient daughter states patient had procedure in hosp with Dr.Gupta on 4.13.19 and needs to know what to do now about her gi symptoms. Patient daughter states pt is Dr.Pyrtle pt but not sure due to her extensive gi hx. Please advise.

## 2017-09-17 NOTE — Evaluation (Signed)
Physical Therapy Evaluation Patient Details Name: Maria Christian MRN: [id number] DOB: 25-Nov-1924 Today's Date: 09/17/2017   History of Present Illness  84 yo female admitted with sigmoid volvulus. hx of Afib, chf, Parkinson's disease, dementia, neuropathy, CKD, DM  Clinical Impression  On eval, pt required Total assist +2 for mobility. She sat EOB for ~ 5 minutes with Mod assist. Multimodal cueing required for participation. No family present during session. Recommend return to SNF.     Follow Up Recommendations SNF    Equipment Recommendations  None recommended by PT    Recommendations for Other Services       Precautions / Restrictions Precautions Precautions: Fall Restrictions Weight Bearing Restrictions: No      Mobility  Bed Mobility Overal bed mobility: Needs Assistance Bed Mobility: Supine to Sit;Sit to Supine     Supine to sit: Total assist;+2 for physical assistance;+2 for safety/equipment;HOB elevated Sit to supine: Total assist;+2 for physical assistance;+2 for safety/equipment;HOB elevated   General bed mobility comments: Assist for trunk and bil LEs. Utilized bedpad for positioning. Sat EOB for ~5 minutes with Mod assist  Transfers                 General transfer comment: NT  Ambulation/Gait                Stairs            Wheelchair Mobility    Modified Rankin (Stroke Patients Only)       Balance Overall balance assessment: Needs assistance   Sitting balance-Leahy Scale: Poor                                       Pertinent Vitals/Pain Pain Assessment: No/denies pain    Home Living Family/patient expects to be discharged to:: Skilled nursing facility                 Additional Comments: LTC at SNF    Prior Function Level of Independence: Needs assistance   Gait / Transfers Assistance Needed: wheelchair bound. non ambulatory  ADL's / Homemaking Assistance Needed: total assist          Hand Dominance        Extremity/Trunk Assessment   Upper Extremity Assessment Upper Extremity Assessment: Generalized weakness    Lower Extremity Assessment Lower Extremity Assessment: Generalized weakness    Cervical / Trunk Assessment Cervical / Trunk Assessment: Kyphotic  Communication   Communication: HOH  Cognition Arousal/Alertness: Awake/alert Behavior During Therapy: WFL for tasks assessed/performed Overall Cognitive Status: Within Functional Limits for tasks assessed                                        General Comments      Exercises     Assessment/Plan    PT Assessment Patient needs continued PT services  PT Problem List Decreased mobility;Decreased strength;Decreased range of motion;Decreased activity tolerance;Decreased balance;Decreased knowledge of use of DME;Decreased cognition       PT Treatment Interventions Therapeutic activities;Therapeutic exercise;Patient/family education    PT Goals (Current goals can be found in the Care Plan section)  Acute Rehab PT Goals Patient Stated Goal: none stated PT Goal Formulation: Patient unable to participate in goal setting Time For Goal Achievement: 10/01/17 Potential to Achieve Goals: Poor    Frequency Min  2X/week   Barriers to discharge        Co-evaluation               AM-PAC PT "6 Clicks" Daily Activity  Outcome Measure Difficulty turning over in bed (including adjusting bedclothes, sheets and blankets)?: Unable Difficulty moving from lying on back to sitting on the side of the bed? : Unable Difficulty sitting down on and standing up from a chair with arms (e.g., wheelchair, bedside commode, etc,.)?: Unable Help needed moving to and from a bed to chair (including a wheelchair)?: Total Help needed walking in hospital room?: Total Help needed climbing 3-5 steps with a railing? : Total 6 Click Score: 6    End of Session   Activity Tolerance: Patient tolerated  treatment well Patient left: in bed;with call bell/phone within reach;with bed alarm set   PT Visit Diagnosis: Muscle weakness (generalized) (M62.81);Other abnormalities of gait and mobility (R26.89)    Time: 0349-1791 PT Time Calculation (min) (ACUTE ONLY): 13 min   Charges:   PT Evaluation $PT Eval Moderate Complexity: 1 Mod     PT G Codes:          Weston Anna, MPT Pager: [phone]

## 2017-09-17 NOTE — Telephone Encounter (Signed)
Pts daughter did not understand that the hospitalist is the MD that admits the pt to the hospital. Explained to her that we have been consulted and are seeing her mother. Discussed with her that she can always speak to the nurse caring for her mother and find out what the plans are. She verbalized understanding and thanked me for the call.

## 2017-09-17 NOTE — Progress Notes (Signed)
Progress Note   Subjective  Chief Complaint: Sigmoid Volvulous  Patient describes relief compared to yesterday, her abdomen is still distended.  Nursing describes 3 bowel movements yesterday and one large bowel movement this morning.  Patient did pull her NG tube out this morning after it was clamped.  Patient denies abdominal pain.  No further nausea or vomiting.   Objective   Vital signs in last 24 hours: Temp:  [98.4 F (36.9 C)-99.5 F (37.5 C)] 98.5 F (36.9 C) (04/15 0800) Pulse Rate:  [58-83] 58 (04/15 0800) Resp:  [14-27] 21 (04/15 0800) BP: (115-150)/(38-86) 150/79 (04/15 0800) SpO2:  [65 %-97 %] 65 % (04/15 0800) Weight:  [156 lb 8.4 oz (71 kg)] 156 lb 8.4 oz (71 kg) (04/14 1105) Last BM Date: 09/16/17 General:   Elderly white female in NAD Heart:  Regular rate and rhythm; no murmurs Lungs: Respirations even and unlabored, lungs CTA bilaterally Abdomen:  Soft, Moderate Distension, Tinkling BS, non-tender Extremities:  Without edema. Neurologic:  Alert and oriented,  grossly normal neurologically. Psych:  Cooperative. Normal mood and affect.  Intake/Output from previous day: 04/14 0701 - 04/15 0700 In: 2265 [P.O.:240; I.V.:2025] Out: 650 [Urine:650] Intake/Output this shift: Total I/O In: 150 [I.V.:150] Out: 150 [Urine:150]  Lab Results: Recent Labs    09/15/17 1650 09/15/17 1654 09/16/17 0328  WBC 7.8  --  9.1  HGB 10.7* 11.6* 9.7*  HCT 33.7* 34.0* 30.8*  PLT 104*  --  95*   BMET Recent Labs    09/15/17 1650 09/15/17 1654 09/16/17 0328 09/17/17 0303  NA 140 140 139 141  K 4.0 4.0 4.4 3.3*  CL 109 109 110 115*  CO2 21*  --  21* 18*  GLUCOSE 175* 171* 179* 128*  BUN 43* 38* 41* 36*  CREATININE 2.17* 2.10* 2.07* 1.70*  CALCIUM 9.4  --  8.4* 8.5*   LFT Recent Labs    09/15/17 1650  PROT 8.1  ALBUMIN 3.9  AST 19  ALT 12*  ALKPHOS 99  BILITOT 0.8   PT/INR No results for input(s): LABPROT, INR in the last 72  hours.  Studies/Results: Ct Abdomen Pelvis Wo Contrast  Result Date: 09/15/2017 CLINICAL DATA:  Nausea, vomiting, high grade bowel obstruction, abdominal distension for 48 hours, history of chronic kidney disease, diabetes mellitus, atrial fibrillation, hypertension, Parkinson's, CHF, coronary artery disease EXAM: CT ABDOMEN AND PELVIS WITHOUT CONTRAST TECHNIQUE: Multidetector CT imaging of the abdomen and pelvis was performed following the standard protocol without IV contrast. Sagittal and coronal MPR images reconstructed from axial data set. No oral contrast was administered COMPARISON:  06/28/2017 FINDINGS: Lower chest: Bibasilar atelectasis. Hepatobiliary: Gallbladder surgically absent.  Liver unremarkable. Pancreas: Atrophic pancreas without obvious mass Spleen: Normal appearance Adrenals/Urinary Tract: Horseshoe kidney. Minimally prominent extrarenal pelves without definite hydronephrosis or hydroureter. Bladder unremarkable. Stomach/Bowel: Appendix not visualized but no pericecal inflammatory process seen. Marked gaseous distention of the sigmoid loop with tapered beak-like appearance of the sigmoid colon in the central pelvis consistent with sigmoid volvulus. Classic appearance on scout image. Sigmoid colon measures up to 11.7 cm transverse. Mild edema of sigmoid mesocolon. More proximally diffuse dilatation of the colon is identified to the cecal tip. Small bowel loops decompressed. No colonic wall thickening or evidence of perforation. Stomach unremarkable. Vascular/Lymphatic: Extensive atherosclerotic calcifications aorta as well as visceral, iliac, and coronary arteries. Aorta normal caliber. No adenopathy. Mitral annular calcification. Heart enlarged. Reproductive: Uterus surgically absent. Ovaries not visualized. No free air free fluid. Other: No free  air or free fluid.  No hernia. Musculoskeletal: Diffuse osseous demineralization. LEFT hip prosthesis with associated beam hardening artifacts in  pelvis. IMPRESSION: Sigmoid volvulus with significant gaseous distention of the sigmoid colon tapering to a beak-like appearance at the distal sigmoid colon. Associated edema of the sigmoid mesocolon and proximal colonic obstruction/dilatation. Sigmoid colon up to 11.7 cm diameter without evidence of perforation. Extensive atherosclerotic calcification including coronary arteries. Horseshoe kidney. Electronically Signed   By: Lavonia Dana M.D.   On: 09/15/2017 17:27   Dg Abd 2 Views  Result Date: 09/17/2017 CLINICAL DATA:  Follow-up sigmoid colon volvulus EXAM: ABDOMEN - 2 VIEW COMPARISON:  09/16/2017 FINDINGS: Prior cholecystectomy. NG tube has been removed. Marked gaseous distention of the colon has slightly increased since prior study. This is most pronounced in the sigmoid colon region. No free air organomegaly. IMPRESSION: Marked gaseous distention of the colon, particularly sigmoid colon, increasing since prior study compatible with sigmoid volvulus. Removal of NG tube. Electronically Signed   By: Rolm Baptise M.D.   On: 09/17/2017 09:07   Dg Abd Portable 1v  Result Date: 09/16/2017 CLINICAL DATA:  Nasogastric tube placement.  Sigmoid volvulus. EXAM: PORTABLE ABDOMEN - 1 VIEW COMPARISON:  09/15/2017 FINDINGS: Nasogastric tube is seen with tip in the proximal stomach. Moderate colonic dilatation again seen, but mildly decreased since previous study. IMPRESSION: Nasogastric tube in appropriate position. Mild decrease in colonic dilatation. Electronically Signed   By: Earle Gell M.D.   On: 09/16/2017 07:52   Dg Abd Portable 1 View  Result Date: 09/15/2017 CLINICAL DATA:  Nasogastric tube placement EXAM: PORTABLE ABDOMEN - 1 VIEW COMPARISON:  Portable exam 1941 hours compared to 1836 hours as well as earlier CT of 09/15/2017 FINDINGS: Nasogastric tube coiled in proximal stomach. Again identified marked gaseous distention of the sigmoid loop compatible with sigmoid volvulus. Mild gas distention of  remainder of colon. Bones demineralized. IMPRESSION: Nasogastric tube coiled in proximal stomach. Marked gaseous distention of sigmoid loop consistent with sigmoid volvulus again identified. Electronically Signed   By: Lavonia Dana M.D.   On: 09/15/2017 20:18   Dg Abd Portable 1 View  Result Date: 09/15/2017 CLINICAL DATA:  Nasogastric tube placement EXAM: PORTABLE ABDOMEN - 1 VIEW COMPARISON:  CT abdomen and pelvis September 15, 2017 FINDINGS: Nasogastric tube tip is in the proximal stomach with the side port at the gastroesophageal junction. Evidence of sigmoid volvulus is again noted with multiple loops of dilated colon. There is marked dilatation of the sigmoid colon which is located in the left upper to mid abdomen. No free air. There is a total hip replacement on the left. IMPRESSION: Nasogastric tube tip in the proximal stomach with side port at gastroesophageal junction. Advise advancing nasogastric tube approximately 8 cm to insure that both tube tip and side port are well within the stomach. Colonic dilatation with evidence of sigmoid volvulus, unchanged from earlier in the day. No free air. Electronically Signed   By: Lowella Grip III M.D.   On: 09/15/2017 19:03    Assessment / Plan:   Assessment: 1.  Recurrent sigmoid volvulus: Status post colonic decompression 4/13, significant stool in the colon, on MiraLAX currently with 3 large bowel movements yesterday and one this morning, KUB today shows worsening of distention, though there are reported bowel movements and patient does not appear toxic  Plan: 1.  We will order CBC today to ensure patient is not toxic, given worsening of distention on recent abdominal x-ray. 2.  Discussed with nursing that  they can leave her NG tube out for now. 3.  Patient was advanced to liquid diet today. 4.  Patient to remain on MiraLAX with enemas as needed. 5.  Please await final recommendations from Dr. Havery Moros later today  We will continue to follow.     LOS: 1 day   Levin Erp  09/17/2017, 9:45 AM  Pager # [phone]

## 2017-09-17 NOTE — Progress Notes (Signed)
[facility] PROGRESS NOTE    Progress Note  Maria Christian  INO:[id number] DOB: 1924-11-13 DOA: 09/15/2017 PCP: [name], DO     Brief Narrative:   Maria Christian is an 84 y.o. female past medical history of hypertension A. fib chronic diastolic heart failure recurrent sigmoid volvulus on 5 separate occasions since 2014, with the patient's family not wanting her to go surgical intervention chronic respiratory failure who presents with abdominal pain nausea and vomiting started 3 days prior to admission  Assessment/Plan:   Acute on Sigmoid volvulus [facility]): Gastroenterology was consulted to perform a flex sigmoidoscopy at bedside with a successful decompression. Abdominal x-ray show improved distention, will repeat abdominal x-ray. Will clamp NG tube, will allow ice chips Continue strict I's and O's and IV hydration.  Acute kidney injury on chronic disease stage III: Likely prerenal in etiology. Baseline creatinine around 1 on admission 2.1, cr is improving slowly. Change IV fluids to half-normal saline with potassium.  Acute respiratory failure with hypoxia: Nursing home had to be placed on nasal cannula we will continue to wean oxygen off.  Headache: Likely multifactorial due to dehydration and hypoxia. CT scan of the head on 2018 showed no acute abnormalities.  Chronic atrial fibrillation: Rate controlled continue aspirin  Normocytic anemia: Hemoglobin 10.4 on admission, baseline hemoglobin around 9.2-9.4 I expect this to drop as we continue to hydrate her as she is probably hemoconcentrated.  Chronic diastolic heart failure: Appears to be hypovolemic continue strict I's and O's and IV fluid hydration.  Chronic thrombocytopenia: Appears to be stable.  Dementia with behavioral disturbance: We will use Seroquel at night IV Haldol as needed. She remove her NG tube and her IV line overnight we will replace these.  DVT prophylaxis: SCD Family  Communication:daughter  Disposition Plan/Barrier to D/C: home in 2 days Code Status:     Code Status Orders  (From admission, onward)        Start     Ordered   09/15/17 2059  Do not attempt resuscitation (DNR)  Continuous    Question Answer Comment  In the event of cardiac or respiratory ARREST Do not call a "code blue"   In the event of cardiac or respiratory ARREST Do not perform Intubation, CPR, defibrillation or ACLS   In the event of cardiac or respiratory ARREST Use medication by any route, position, wound care, and other measures to relive pain and suffering. May use oxygen, suction and manual treatment of airway obstruction as needed for comfort.      09/15/17 2058    Code Status History    Date Active Date Inactive Code Status Order ID Comments User Context   06/28/2017 1955 07/08/2017 2114 DNR [id number]  Oswald Hillock, MD Inpatient   04/17/2016 2329 04/24/2016 1649 DNR [id number]  Toy Baker, MD Inpatient   12/20/2015 1854 12/23/2015 1629 DNR [id number]  Florencia Reasons, MD Inpatient   03/13/2015 1940 03/15/2015 2009 DNR [id number]  Oswald Hillock, MD Inpatient   03/11/2015 1312 03/13/2015 1940 Full Code [id number]  Oswald Hillock, MD Inpatient   02/26/2015 2314 02/28/2015 1959 Full Code [id number]  Dianne Dun, NP Inpatient   02/26/2015 2134 02/26/2015 2314 DNR [id number]  Toy Baker, MD Inpatient   12/28/2013 0054 01/05/2014 2109 DNR [id number]  Jonetta Osgood, MD ED   10/28/2013 2007 12/28/2013 0054 Full Code [id number]  Hennie Duos, MD Outpatient   10/10/2013 0006 10/23/2013 2030 Full Code [id number]  Elgergawy, Silver Huguenin, MD ED  07/29/2013 1633 10/10/2013 0006 Full Code [id number]  Hennie Duos, MD Outpatient   07/24/2013 1028 07/28/2013 2116 DNR [id number]  Rise Patience, MD ED   05/24/2013 0241 06/02/2013 2224 DNR [id number]  Allie Bossier, MD ED   07/13/2011 1726 07/20/2011 1801 Full Code [id number]  Roe Rutherford, RN Inpatient   05/22/2011 2103 05/24/2011  1443 Full Code [id number]  Pecola Leisure, RN Inpatient        IV Access:    Peripheral IV   Procedures and diagnostic studies:   Ct Abdomen Pelvis Wo Contrast  Result Date: 09/15/2017 CLINICAL DATA:  Nausea, vomiting, high grade bowel obstruction, abdominal distension for 48 hours, history of chronic kidney disease, diabetes mellitus, atrial fibrillation, hypertension, Parkinson's, CHF, coronary artery disease EXAM: CT ABDOMEN AND PELVIS WITHOUT CONTRAST TECHNIQUE: Multidetector CT imaging of the abdomen and pelvis was performed following the standard protocol without IV contrast. Sagittal and coronal MPR images reconstructed from axial data set. No oral contrast was administered COMPARISON:  06/28/2017 FINDINGS: Lower chest: Bibasilar atelectasis. Hepatobiliary: Gallbladder surgically absent.  Liver unremarkable. Pancreas: Atrophic pancreas without obvious mass Spleen: Normal appearance Adrenals/Urinary Tract: Horseshoe kidney. Minimally prominent extrarenal pelves without definite hydronephrosis or hydroureter. Bladder unremarkable. Stomach/Bowel: Appendix not visualized but no pericecal inflammatory process seen. Marked gaseous distention of the sigmoid loop with tapered beak-like appearance of the sigmoid colon in the central pelvis consistent with sigmoid volvulus. Classic appearance on scout image. Sigmoid colon measures up to 11.7 cm transverse. Mild edema of sigmoid mesocolon. More proximally diffuse dilatation of the colon is identified to the cecal tip. Small bowel loops decompressed. No colonic wall thickening or evidence of perforation. Stomach unremarkable. Vascular/Lymphatic: Extensive atherosclerotic calcifications aorta as well as visceral, iliac, and coronary arteries. Aorta normal caliber. No adenopathy. Mitral annular calcification. Heart enlarged. Reproductive: Uterus surgically absent. Ovaries not visualized. No free air free fluid. Other: No free air or free fluid.  No hernia.  Musculoskeletal: Diffuse osseous demineralization. LEFT hip prosthesis with associated beam hardening artifacts in pelvis. IMPRESSION: Sigmoid volvulus with significant gaseous distention of the sigmoid colon tapering to a beak-like appearance at the distal sigmoid colon. Associated edema of the sigmoid mesocolon and proximal colonic obstruction/dilatation. Sigmoid colon up to 11.7 cm diameter without evidence of perforation. Extensive atherosclerotic calcification including coronary arteries. Horseshoe kidney. Electronically Signed   By: Lavonia Dana M.D.   On: 09/15/2017 17:27   Dg Abd Portable 1v  Result Date: 09/16/2017 CLINICAL DATA:  Nasogastric tube placement.  Sigmoid volvulus. EXAM: PORTABLE ABDOMEN - 1 VIEW COMPARISON:  09/15/2017 FINDINGS: Nasogastric tube is seen with tip in the proximal stomach. Moderate colonic dilatation again seen, but mildly decreased since previous study. IMPRESSION: Nasogastric tube in appropriate position. Mild decrease in colonic dilatation. Electronically Signed   By: Earle Gell M.D.   On: 09/16/2017 07:52   Dg Abd Portable 1 View  Result Date: 09/15/2017 CLINICAL DATA:  Nasogastric tube placement EXAM: PORTABLE ABDOMEN - 1 VIEW COMPARISON:  Portable exam 1941 hours compared to 1836 hours as well as earlier CT of 09/15/2017 FINDINGS: Nasogastric tube coiled in proximal stomach. Again identified marked gaseous distention of the sigmoid loop compatible with sigmoid volvulus. Mild gas distention of remainder of colon. Bones demineralized. IMPRESSION: Nasogastric tube coiled in proximal stomach. Marked gaseous distention of sigmoid loop consistent with sigmoid volvulus again identified. Electronically Signed   By: Lavonia Dana M.D.   On: 09/15/2017 20:18   Dg Abd Portable 1 View  Result Date:  09/15/2017 CLINICAL DATA:  Nasogastric tube placement EXAM: PORTABLE ABDOMEN - 1 VIEW COMPARISON:  CT abdomen and pelvis September 15, 2017 FINDINGS: Nasogastric tube tip is in the  proximal stomach with the side port at the gastroesophageal junction. Evidence of sigmoid volvulus is again noted with multiple loops of dilated colon. There is marked dilatation of the sigmoid colon which is located in the left upper to mid abdomen. No free air. There is a total hip replacement on the left. IMPRESSION: Nasogastric tube tip in the proximal stomach with side port at gastroesophageal junction. Advise advancing nasogastric tube approximately 8 cm to insure that both tube tip and side port are well within the stomach. Colonic dilatation with evidence of sigmoid volvulus, unchanged from earlier in the day. No free air. Electronically Signed   By: Lowella Grip III M.D.   On: 09/15/2017 19:03     Medical Consultants:    None.  Anti-Infectives:   None  Subjective:    [name] patient relates her abdominal pain is improved.. Objective:    Vitals:   09/16/17 2333 09/17/17 0000 09/17/17 0301 09/17/17 0400  BP:  (!) 141/86  140/65  Pulse:  83  77  Resp:  (!) 27  17  Temp: 99.5 F (37.5 C)  99 F (37.2 C)   TempSrc: Oral  Oral   SpO2:  96%  94%  Weight:      Height:        Intake/Output Summary (Last 24 hours) at 09/17/2017 0730 Last data filed at 09/17/2017 0700 Gross per 24 hour  Intake 2265 ml  Output 650 ml  Net 1615 ml   Filed Weights   09/15/17 2200 09/16/17 1105  Weight: 71 kg (156 lb 8.4 oz) 71 kg (156 lb 8.4 oz)    Exam: General exam: In no acute distress, extremely dry mucous membranes. Respiratory system: Good air movement and clear to auscultation. Cardiovascular system: S1 & S2 heard, RRR.  Gastrointestinal system: Abdomen is nondistended, soft and nontender.  Central nervous system: Alert and oriented. No focal neurological deficits. Extremities: No pedal edema. Skin: No rashes, lesions or ulcers    Data Reviewed:    Labs: Basic Metabolic Panel: Recent Labs  Lab 09/15/17 1650 09/15/17 1654 09/16/17 0328 09/17/17 0303  NA  140 140 139 141  K 4.0 4.0 4.4 3.3*  CL 109 109 110 115*  CO2 21*  --  21* 18*  GLUCOSE 175* 171* 179* 128*  BUN 43* 38* 41* 36*  CREATININE 2.17* 2.10* 2.07* 1.70*  CALCIUM 9.4  --  8.4* 8.5*   GFR Estimated Creatinine Clearance: 21.7 mL/min (A) (by C-G formula based on SCr of 1.7 mg/dL (H)). Liver Function Tests: Recent Labs  Lab 09/15/17 1650  AST 19  ALT 12*  ALKPHOS 99  BILITOT 0.8  PROT 8.1  ALBUMIN 3.9   No results for input(s): LIPASE, AMYLASE in the last 168 hours. No results for input(s): AMMONIA in the last 168 hours. Coagulation profile No results for input(s): INR, PROTIME in the last 168 hours.  CBC: Recent Labs  Lab 09/15/17 1650 09/15/17 1654 09/16/17 0328  WBC 7.8  --  9.1  NEUTROABS 5.8  --   --   HGB 10.7* 11.6* 9.7*  HCT 33.7* 34.0* 30.8*  MCV 106.3*  --  108.5*  PLT 104*  --  95*   Cardiac Enzymes: No results for input(s): CKTOTAL, CKMB, CKMBINDEX, TROPONINI in the last 168 hours. BNP (last 3 results) No  results for input(s): PROBNP in the last 8760 hours. CBG: Recent Labs  Lab 09/16/17 1131  GLUCAP 144*   D-Dimer: No results for input(s): DDIMER in the last 72 hours. Hgb A1c: No results for input(s): HGBA1C in the last 72 hours. Lipid Profile: No results for input(s): CHOL, HDL, LDLCALC, TRIG, CHOLHDL, LDLDIRECT in the last 72 hours. Thyroid function studies: No results for input(s): TSH, T4TOTAL, T3FREE, THYROIDAB in the last 72 hours.  Invalid input(s): FREET3 Anemia work up: No results for input(s): VITAMINB12, FOLATE, FERRITIN, TIBC, IRON, RETICCTPCT in the last 72 hours. Sepsis Labs: Recent Labs  Lab 09/15/17 1650 09/15/17 1655 09/16/17 0328  WBC 7.8  --  9.1  LATICACIDVEN  --  1.11  --    Microbiology Recent Results (from the past 240 hour(s))  MRSA PCR Screening     Status: None   Collection Time: 09/15/17 10:10 PM  Result Value Ref Range Status   MRSA by PCR NEGATIVE NEGATIVE Final    Comment:        The  GeneXpert MRSA Assay (FDA approved for NASAL specimens only), is one component of a comprehensive MRSA colonization surveillance program. It is not intended to diagnose MRSA infection nor to guide or monitor treatment for MRSA infections. Performed at [facility], [place] [address] [place], [place] [address]      Medications:   . diclofenac sodium  2 g Topical BID  . dorzolamide  1 drop Both Eyes TID  . latanoprost  1 drop Both Eyes QHS  . lidocaine  15 mL Mouth/Throat Once  . nystatin  1 g Topical BID  . pantoprazole (PROTONIX) IV  40 mg Intravenous Q12H  . polyethylene glycol  17 g Oral BID   Continuous Infusions: . sodium chloride 0.45 % 1,000 mL with potassium chloride 40 mEq infusion        LOS: 1 day   Charlynne Cousins  [facility] Pager [phone]  *Please refer to [email or web address], password [email or web address] to get updated schedule on who will round on this patient, as hospitalists switch teams weekly. If 7PM-7AM, please contact night-coverage at [email or web address], password [email or web address] for any overnight needs.  09/17/2017, 7:30 AM

## 2017-09-17 NOTE — Progress Notes (Signed)
Dr Vertis Kelch notifed of K 3.3. Patient transferred to room 1328 .Report given to RN.Marland Kitchen

## 2017-09-18 ENCOUNTER — Encounter (HOSPITAL_COMMUNITY)
Admission: EM | Disposition: A | Discharge status: Skilled Nursing Facility | Payer: Self-pay | Source: Home / Self Care | Attending: Internal Medicine

## 2017-09-18 ENCOUNTER — Inpatient Hospital Stay (HOSPITAL_COMMUNITY): Payer: Medicare Other

## 2017-09-18 ENCOUNTER — Encounter (HOSPITAL_COMMUNITY): Payer: Self-pay

## 2017-09-18 DIAGNOSIS — K5939 Other megacolon: Secondary | ICD-10-CM

## 2017-09-18 DIAGNOSIS — K6389 Other specified diseases of intestine: Secondary | ICD-10-CM

## 2017-09-18 DIAGNOSIS — K633 Ulcer of intestine: Secondary | ICD-10-CM

## 2017-09-18 HISTORY — PX: COLONOSCOPY: SHX5424

## 2017-09-18 LAB — BASIC METABOLIC PANEL
ANION GAP: 7 (ref 5–15)
BUN: 28 mg/dL — ABNORMAL HIGH (ref 6–20)
CO2: 16 mmol/L — ABNORMAL LOW (ref 22–32)
Calcium: 8.3 mg/dL — ABNORMAL LOW (ref 8.9–10.3)
Chloride: 116 mmol/L — ABNORMAL HIGH (ref 101–111)
Creatinine, Ser: 1.43 mg/dL — ABNORMAL HIGH (ref 0.44–1.00)
GFR, EST AFRICAN AMERICAN: 36 mL/min — AB (ref >60)
GFR, EST NON AFRICAN AMERICAN: 31 mL/min — AB (ref >60)
GLUCOSE: 112 mg/dL — AB (ref 65–99)
POTASSIUM: 3.2 mmol/L — AB (ref 3.5–5.1)
Sodium: 139 mmol/L (ref 135–145)

## 2017-09-18 SURGERY — COLONOSCOPY
Anesthesia: Moderate Sedation

## 2017-09-18 MED ORDER — POTASSIUM CHLORIDE CRYS ER 20 MEQ PO TBCR
40.0000 meq | EXTENDED_RELEASE_TABLET | Freq: Every day | ORAL | Status: DC
  Administered 2017-09-19 – 2017-09-26 (×8): 40 meq via ORAL
  Filled 2017-09-18 (×8): qty 2

## 2017-09-18 MED ORDER — POLYETHYLENE GLYCOL 3350 17 G PO PACK
17.0000 g | PACK | Freq: Every day | ORAL | Status: DC
  Administered 2017-09-19 – 2017-09-26 (×7): 17 g via ORAL
  Filled 2017-09-18 (×8): qty 1

## 2017-09-18 MED ORDER — GABAPENTIN 100 MG PO CAPS
100.0000 mg | ORAL_CAPSULE | Freq: Every day | ORAL | Status: DC
  Administered 2017-09-19 – 2017-09-26 (×8): 100 mg via ORAL
  Filled 2017-09-18 (×8): qty 1

## 2017-09-18 MED ORDER — MORPHINE SULFATE (PF) 2 MG/ML IV SOLN
2.0000 mg | INTRAVENOUS | Status: AC | PRN
  Administered 2017-09-18 – 2017-09-19 (×2): 2 mg via INTRAVENOUS
  Filled 2017-09-18 (×3): qty 1

## 2017-09-18 MED ORDER — BACLOFEN 10 MG PO TABS
5.0000 mg | ORAL_TABLET | Freq: Every day | ORAL | Status: DC
  Administered 2017-09-19 – 2017-09-26 (×8): 5 mg via ORAL
  Filled 2017-09-18 (×8): qty 1

## 2017-09-18 MED ORDER — POTASSIUM CHLORIDE CRYS ER 20 MEQ PO TBCR
20.0000 meq | EXTENDED_RELEASE_TABLET | Freq: Every day | ORAL | Status: DC
  Administered 2017-09-19 – 2017-09-26 (×8): 20 meq via ORAL
  Filled 2017-09-18 (×8): qty 1

## 2017-09-18 MED ORDER — MIDAZOLAM HCL 5 MG/ML IJ SOLN
INTRAMUSCULAR | Status: AC
  Filled 2017-09-18: qty 1

## 2017-09-18 MED ORDER — MIDAZOLAM HCL 10 MG/2ML IJ SOLN
INTRAMUSCULAR | Status: DC | PRN
  Administered 2017-09-18 (×2): 1 mg via INTRAVENOUS

## 2017-09-18 MED ORDER — POTASSIUM CHLORIDE 10 MEQ/100ML IV SOLN
10.0000 meq | INTRAVENOUS | Status: AC
  Administered 2017-09-18 (×2): 10 meq via INTRAVENOUS
  Filled 2017-09-18: qty 100

## 2017-09-18 MED ORDER — FENTANYL CITRATE (PF) 100 MCG/2ML IJ SOLN
INTRAMUSCULAR | Status: AC
Start: 2017-09-18 — End: 2017-09-18
  Filled 2017-09-18: qty 2

## 2017-09-18 MED ORDER — TORSEMIDE 20 MG PO TABS: 20.0000 mg | ORAL_TABLET | Freq: Every day | ORAL | Status: DC

## 2017-09-18 MED ORDER — CALCIUM CARBONATE ANTACID 500 MG PO CHEW
2.0000 | CHEWABLE_TABLET | Freq: Three times a day (TID) | ORAL | Status: DC
  Administered 2017-09-19 – 2017-09-25 (×21): 400 mg via ORAL
  Filled 2017-09-18 (×19): qty 2

## 2017-09-18 NOTE — Op Note (Signed)
[facility] Patient Name: Maria Christian Procedure Date: 09/18/2017 MRN: [id number] Attending MD: Carlota Raspberry. Armbruster MD, MD Date of Birth: [birth year] CSN: [id number] Age: 84 Admit Type: Inpatient Procedure:                Colonoscopy Indications:              For therapy of volvulus, recurrence of abdominal                            distension, s/p prior decompression, dilation of                            right colon on x ray Providers:                Carlota Raspberry. Armbruster MD, MD, Zenon Mayo, RN,                            Charolette Child, Technician Referring MD:              Medicines:                Midazolam 2 mg IV Complications:            No immediate complications. Estimated blood loss:                            Minimal. Estimated Blood Loss:     Estimated blood loss was minimal. Procedure:                Pre-Anesthesia Assessment:                           - Prior to the procedure, a History and Physical                            was performed, and patient medications and                            allergies were reviewed. The patient's tolerance of                            previous anesthesia was also reviewed. The risks                            and benefits of the procedure and the sedation                            options and risks were discussed with the patient.                            All questions were answered, and informed consent                            was obtained. Prior Anticoagulants: The patient has  taken no previous anticoagulant or antiplatelet                            agents. ASA Grade Assessment: III - A patient with                            severe systemic disease. After reviewing the risks                            and benefits, the patient was deemed in                            satisfactory condition to undergo the procedure.                           After obtaining informed consent, the  colonoscope                            was passed under direct vision. Throughout the                            procedure, the patient's blood pressure, pulse, and                            oxygen saturations were monitored continuously. The                            was introduced through the anus The procedure was                            aborted. The scope was not inserted. cecum                            Medications were ascending colon. After obtaining                            informed consent, the colonoscope was passed under                            direct vision. Throughout the procedure, the                            patient's blood pressure, pulse, and oxygen                            saturations were monitored continuously.The                            colonoscopy was technically difficult and complex                            due to inadequate bowel prep. The patient tolerated  the procedure well. The quality of the bowel                            preparation was poor. No anatomical landmarks were                            photographed. Scope In: Scope Out: Findings:      The perianal and digital rectal examinations were normal.      The colon was very redundant, particularly in the left colon.      A large amount of liquid stool was found in the entire colon, making       visualization difficult. Water immersion was used when needed, and only       minimal insufflation with C02 was used.      A localized area of congested, erythematous, nodular, ulcerated mucosa       was found in the suspected transverse colon, suspect localized ischemic       change. Biopsies were taken with a cold forceps for histology.      The lumen of the colon was dilated diffusely. I suspect the ascending       colon was reached although difficult to say given prep was so poor in       this area, which was quite dilated, and air was suctioned. The        colonoscope was withdrawn and the colon was suctioned on the way out to       decompress the bowel. The patient's abdominal exam was significantly       improved after this intervention. Impression:               - Preparation of the colon was poor.                           - Redundant colon.                           - Inflamed mucosa in the suspected transverse                            colon, suspect localized ischemic change. Biopsied.                           - Dilated lumen diffusely colon, the colon was                            decompressed. Moderate Sedation:      Moderate (conscious) sedation was administered by the endoscopy nurse       and supervised by the endoscopist. The following parameters were       monitored: oxygen saturation, heart rate, blood pressure, and response       to care. Total physician intraservice time was 25 minutes. Recommendation:           - Return patient to hospital ward for ongoing care.                           - NPO.                           -  Repeat abdominal xray now                           - Continue present medications.                           - GI service will follow Procedure Code(s):        --- Professional ---                           [phone], 52, Colonoscopy, flexible; with biopsy,                            single or multiple                           99152, 59, Moderate sedation services provided by                            the same physician or other qualified health care                            professional performing the diagnostic or                            therapeutic service that the sedation supports,                            requiring the presence of an independent trained                            observer to assist in the monitoring of the                            patient's level of consciousness and physiological                            status; initial 15 minutes of intraservice time,                             patient age 68 years or older                           [phone], Moderate sedation services provided by the                            same physician or other qualified health care                            professional performing the diagnostic or                            therapeutic service that the sedation supports,  requiring the presence of an independent trained                            observer to assist in the monitoring of the                            patient's level of consciousness and physiological                            status; each additional 15 minutes intraservice                            time (List separately in addition to code for                            primary service) Diagnosis Code(s):        --- Professional ---                           K63.89, Other specified diseases of intestine                           K63.3, Ulcer of intestine                           K59.39, Other megacolon                           K56.2, Volvulus                           Q43.8, Other specified congenital malformations of                            intestine CPT copyright 2017 American Medical Association. All rights reserved. The codes documented in this report are preliminary and upon coder review may  be revised to meet current compliance requirements. Remo Lipps P. Armbruster MD, MD 09/18/2017 4:43:12 PM This report has been signed electronically. Number of Addenda: 0

## 2017-09-18 NOTE — Clinical Social Work Note (Signed)
Clinical Social Work Assessment  Patient Details  Name: Maria Christian MRN: [id number] Date of Birth: 1924/06/24  Date of referral:  09/18/17               Reason for consult:  (pt admitted from facility)                Permission sought to share information with:  Family Supports Permission granted to share information::  Yes, Verbal Permission Granted  Name::     daughters Maria Christian and Maria Christian, son Maria Christian  Agency::     Relationship::     Contact Information:     Housing/Transportation Living arrangements for the past 2 months:  [place] of Information:  Adult Children, Facility Patient Interpreter Needed:  None Criminal Activity/Legal Involvement Pertinent to Current Situation/Hospitalization:  No - Comment as needed Significant Relationships:  Adult Children, Warehouse manager Lives with:  Facility Resident Do you feel safe going back to the place where you live?  Yes Need for family participation in patient care:  Yes (Comment)(children assist)  Care giving concerns:  Pt lives at [facility]- needs maximum assistance.  Daughter reports in general they are pleased with pt's care there but that "we are having issues with 2 certain caregivers." States they have made reports with the facility and CSW expressed this was appropriate response to their concerns.  Pt currently admitted for sigmoid volvulus. Daughter reports this is recurrent issue.  Social Worker assessment / plan:  CSW consulted to assist with disposition as pt is resident of facility- [facility]. Has lived there x 4 years. Pt sleeping at time of assessment (preparing for procedure later today). CSW spoke with daughter who is known to [facility] from pt's previous encounters. Reports plan to return to [place] once medical workup complete and pt ready for DC.  Updated pt's facility. Will complete FL2.  Employment status:  Retired Chief Technology Officer) PT Recommendations:  [place] / Referral to community resources:  [place]  Patient/Family's Response to care:  Pt's daughter appreciative of care  Patient/Family's Understanding of and Emotional Response to Diagnosis, Current Treatment, and Prognosis:  Pt sleeping, unable to assess understanding. Daughter demonstrates good understanding and was good historian of mother's care. Fixated on describing circumstantial details of pt's care at SNF but was emotionally appropriate if frustrated.   Emotional Assessment Appearance:  Appears stated age Attitude/Demeanor/Rapport:  (sleeping) Affect (typically observed):  (sleeping) Orientation:  Oriented to Self, Oriented to Place Alcohol / Substance use:  Not Applicable Psych involvement (Current and /or in the community):  No (Comment)  Discharge Needs  Concerns to be addressed:  Care Coordination Readmission within the last 30 days:  No Current discharge risk:  None Barriers to Discharge:  Continued Medical Work up   [employer], LCSW 09/18/2017, 3:34 PM [phone]

## 2017-09-18 NOTE — NC FL2 (Signed)
[place] LEVEL OF CARE SCREENING TOOL     IDENTIFICATION  Patient Name: Maria Christian Birthdate: [birth year] Sex: female Admission Date (Current Location): 09/15/2017  [facility] and [place] Number:  Herbalist and Address:  [facility],  [place], [place]      Provider Number: [id number]  Attending Physician Name and Address:  Charlynne Cousins, MD  Relative Name and Phone Number:       Current Level of Care: Hospital Recommended Level of Care: [place] Prior Approval Number:    Date Approved/Denied:   PASRR Number: [id number]  Discharge Plan: SNF    Current Diagnoses: Patient Active Problem List   Diagnosis Date Noted  . Sigmoid volvulus ([place]) 09/15/2017  . UTI (urinary tract infection) 06/28/2017  . Acute bronchitis 04/16/2017  . Abnormal weight loss 03/02/2017  . TMJ (temporomandibular joint syndrome) 03/02/2017  . Shingles 12/11/2016  . Dilatation of colon   . Diarrhea 01/04/2016  . Acute kidney injury superimposed on chronic kidney disease ([place]) 01/04/2016  . GERD (gastroesophageal reflux disease) 01/04/2016  . DM (diabetes mellitus), type 2 ([place]) 01/04/2016  . Hypertensive heart disease with congestive heart failure ([place]) 12/02/2015  . Vitamin B12 deficiency 09/27/2015  . Chronic respiratory failure ([place]) 03/16/2015  . Allergic rhinitis 03/16/2015  . Chronic pain 03/16/2015  . Osteoarthritis of multiple joints 03/16/2015  . Duodenal ulcer 03/12/2015  . Headache 03/09/2015  . Cephalgia 03/01/2015  . Constipation 02/27/2015  . Anemia of chronic disease 02/27/2015  . Chronic atrial fibrillation ([place]) 02/27/2015  . Hypokalemia 02/27/2015  . CKD (chronic kidney disease) stage 3, GFR 30-59 ml/min (HCC) 02/27/2015  . Ileus ([place]) 02/26/2015  . Malnutrition of moderate degree ([place]) 10/10/2013  . Normocytic anemia 10/09/2013  . FTT (failure to thrive) in adult 07/29/2013  . Volvulus of  sigmoid colon ([place]) 05/26/2013  . Dementia with behavioral disturbance 12/19/2012  . Depression 12/19/2012  . Thrombocytopenia ([place]) 07/16/2011  . Coronary artery disease   . Diastolic CHF, chronic ([place]) 05/24/2011    Orientation RESPIRATION BLADDER Height & Weight     Self, Place  Normal Incontinent Weight: 156 lb 8.4 oz (71 kg) Height:  5\' 5"  (165.1 cm)  BEHAVIORAL SYMPTOMS/MOOD NEUROLOGICAL BOWEL NUTRITION STATUS      Incontinent Diet(NPO- see DC summary for updated diet)  AMBULATORY STATUS COMMUNICATION OF NEEDS Skin   Extensive Assist Verbally Normal                       Personal Care Assistance Level of Assistance  Bathing, Dressing Bathing Assistance: Maximum assistance   Dressing Assistance: Maximum assistance     Functional Limitations Info  Sight, Hearing, Speech Sight Info: Adequate Hearing Info: Adequate Speech Info: Adequate    SPECIAL CARE FACTORS FREQUENCY                       Contractures Contractures Info: Not present    Additional Factors Info  Code Status, Allergies, Isolation Precautions Code Status Info: DNR Allergies Info: aspirin     Isolation Precautions Info: contact precautions- ESBL     Current Medications (09/18/2017):  This is the current hospital active medication list Current Facility-Administered Medications  Medication Dose Route Frequency Provider Last Rate Last Dose  . [MAR Hold] acetaminophen (TYLENOL) tablet 650 mg  650 mg Oral Q6H PRN Charlynne Cousins, MD   650 mg at 09/17/17 1107   Or  . [  MAR Hold] acetaminophen (TYLENOL) suppository 650 mg  650 mg Rectal Q6H PRN Charlynne Cousins, MD      . [facility] Sou Hold] amLODipine (NORVASC) tablet 2.5 mg  2.5 mg Oral Daily Charlynne Cousins, MD   2.5 mg at 09/18/17 1055  . [MAR Hold] aspirin-acetaminophen-caffeine (EXCEDRIN MIGRAINE) per tablet 2 tablet  2 tablet Oral Q12H PRN Charlynne Cousins, MD   2 tablet at 09/17/17 2327  . [MAR Hold] baclofen (LIORESAL) tablet  5 mg  5 mg Oral QHS Charlynne Cousins, MD      . Doug Sou Hold] benzocaine-menthol [facility]) lozenge 1 lozenge  1 lozenge Oral PRN Charlynne Cousins, MD      . [facility] Hold] calcium carbonate (TUMS - dosed in mg elemental calcium) chewable tablet 400 mg of elemental calcium  2 tablet Oral TID Charlynne Cousins, MD      . [facility] Hold] diclofenac sodium (VOLTAREN) 1 % transdermal gel 2 g  2 g Topical BID Charlynne Cousins, MD   2 g at 09/18/17 1055  . [MAR Hold] dorzolamide (TRUSOPT) 2 % ophthalmic solution 1 drop  1 drop Both Eyes TID Charlynne Cousins, MD   1 drop at 09/18/17 1057  . [MAR Hold] famotidine (PEPCID) IVPB 20 mg premix  20 mg Intravenous Q24H Minda Ditto, RPH   Stopped at 09/17/17 2335  . [MAR Hold] gabapentin (NEURONTIN) capsule 100 mg  100 mg Oral QHS Charlynne Cousins, MD      . [facility] Hold] haloperidol lactate (HALDOL) injection 1 mg  1 mg Intravenous Q6H PRN Charlynne Cousins, MD      . [facility] Hold] ipratropium-albuterol (DUONEB) 0.5-2.5 (3) MG/3ML nebulizer solution 3 mL  3 mL Nebulization Q6H PRN Charlynne Cousins, MD      . [facility] Hold] latanoprost (XALATAN) 0.005 % ophthalmic solution 1 drop  1 drop Both Eyes QHS Charlynne Cousins, MD   1 drop at 09/18/17 0121  . [MAR Hold] lidocaine (XYLOCAINE) 2 % viscous mouth solution 15 mL  15 mL Mouth/Throat Once Aileen Fass, Tammi Klippel, MD      . [facility] Hold] nystatin (MYCOSTATIN/NYSTOP) topical powder 1 g  1 g Topical BID Charlynne Cousins, MD   1 g at 09/18/17 1057  . [MAR Hold] ondansetron (ZOFRAN) tablet 4 mg  4 mg Oral Q6H PRN Charlynne Cousins, MD       Or  . Doug Sou Hold] ondansetron [facility]) injection 4 mg  4 mg Intravenous Q6H PRN Charlynne Cousins, MD      . Doug Sou Hold] phenol [facility]) mouth spray 1 spray  1 spray Mouth/Throat PRN Charlynne Cousins, MD      . Doug Sou Hold] polyethylene glycol [facility] / GLYCOLAX) packet 17 g  17 g Oral Daily Aileen Fass, Tammi Klippel, MD      . [facility] Hold] potassium chloride  10 mEq in 100 mL IVPB  10 mEq Intravenous Q1 Hr x 5 Charlynne Cousins, MD 100 mL/hr at 09/18/17 1426 10 mEq at 09/18/17 1426  . [MAR Hold] potassium chloride SA (K-DUR,KLOR-CON) CR tablet 20 mEq  20 mEq Oral QHS Charlynne Cousins, MD      . [facility] Sou Hold] potassium chloride SA (K-DUR,KLOR-CON) CR tablet 40 mEq  40 mEq Oral Daily Aileen Fass, Tammi Klippel, MD      . Doug Sou Hold] QUEtiapine (SEROQUEL) tablet 25 mg  25 mg Oral QHS PRN Charlynne Cousins, MD   25 mg at 09/17/17 2312  . [facility]  Hold] sodium chloride (OCEAN) 0.65 % nasal spray 1 spray  1 spray Each Nare QID PRN Charlynne Cousins, MD         Discharge Medications: Please see discharge summary for a list of discharge medications.  Relevant Imaging Results:  Relevant Lab Results:   Additional Information SS# [id number]  Nila Nephew, LCSW

## 2017-09-18 NOTE — Progress Notes (Signed)
[facility] PROGRESS NOTE    Progress Note  Maria Christian  UTM:[id number] DOB: [birth year] DOA: 09/15/2017 PCP: [name], DO     Brief Narrative:   Maria Christian is an 84 y.o. female past medical history of hypertension A. fib chronic diastolic heart failure recurrent sigmoid volvulus on 5 separate occasions since 2014, with the patient's family not wanting her to go surgical intervention chronic respiratory failure who presents with abdominal pain nausea and vomiting started 3 days prior to admission  Assessment/Plan:   Acute on Sigmoid volvulus [facility]): Gastroenterology was consulted to perform a flex sigmoidoscopy at bedside with a successful decompression. Repeated abd x-rays show improved exam, physical exam in unremarkable.. Abdominal x-ray showed increased gaseous distention.  We will repeat abdominal x-ray tomorrow we will discuss with GI. Place her n.p.o.  Acute kidney injury on chronic disease stage III: Likely prerenal in etiology.  Resolved Baseline creatinine around 1 on admission 2.1, KVO IV fluids. Check a basic metabolic panel in the morning.  Acute respiratory failure with hypoxia: Nursing home had to be placed on nasal cannula we will continue to wean oxygen off.  Headache: Likely multifactorial due to dehydration and hypoxia.  Headache is improved. CT scan of the head on 2018 showed no acute abnormalities. No indication for MRI at this point.  Chronic atrial fibrillation: Rate controlled continue aspirin  Normocytic anemia: Gl[place]in at baseline.  No signs of bleeding.  Chronic diastolic heart failure: Appears to be euvolemic continue strict I's and O's, KVO IV fluids. To hold Lasix and she will be placed n.p.o.  Chronic thrombocytopenia: Appears to be stable.  Dementia with behavioral disturbance: We will use Seroquel at night IV Haldol as needed. She remove her NG tube and her IV line overnight we will replace these.  Non-anion gap  hyperchloremic metabolic acidosis: Likely due to normal saline and acute renal failure. KVO IV fluids allow oral diet replete potassium IV recheck in the morning  DVT prophylaxis: SCD Family Communication:daughter  Disposition Plan/Barrier to D/C: Unable to determine. Code Status:     Code Status Orders  (From admission, onward)        Start     Ordered   09/15/17 2059  Do not attempt resuscitation (DNR)  Continuous    Question Answer Comment  In the event of cardiac or respiratory ARREST Do not call a "code blue"   In the event of cardiac or respiratory ARREST Do not perform Intubation, CPR, defibrillation or ACLS   In the event of cardiac or respiratory ARREST Use medication by any route, position, wound care, and other measures to relive pain and suffering. May use oxygen, suction and manual treatment of airway obstruction as needed for comfort.      09/15/17 2058    Code Status History    Date Active Date Inactive Code Status Order ID Comments User Context   06/28/2017 1955 07/08/2017 2114 DNR [id number]  Oswald Hillock, MD Inpatient   04/17/2016 2329 04/24/2016 1649 DNR [id number]  Toy Baker, MD Inpatient   12/20/2015 1854 12/23/2015 1629 DNR [id number]  Florencia Reasons, MD Inpatient   03/13/2015 1940 03/15/2015 2009 DNR [id number]  Oswald Hillock, MD Inpatient   03/11/2015 1312 03/13/2015 1940 Full Code [id number]  Oswald Hillock, MD Inpatient   02/26/2015 2314 02/28/2015 1959 Full Code [id number]  Dianne Dun, NP Inpatient   02/26/2015 2134 02/26/2015 2314 DNR [id number]  Toy Baker, MD Inpatient   12/28/2013 0054 01/05/2014 2109 DNR [id number]  Jonetta Osgood, MD ED   10/28/2013 2007 12/28/2013 0054 Full Code [id number]  Hennie Duos, MD Outpatient   10/10/2013 0006 10/23/2013 2030 Full Code [id number]  Elgergawy, Silver Huguenin, MD ED   07/29/2013 1633 10/10/2013 0006 Full Code [id number]  Hennie Duos, MD Outpatient   07/24/2013 1028 07/28/2013 2116 DNR [id number]  Rise Patience, MD ED   05/24/2013 0241 06/02/2013 2224 DNR [id number]  Allie Bossier, MD ED   07/13/2011 1726 07/20/2011 1801 Full Code [id number]  Roe Rutherford, RN Inpatient   05/22/2011 2103 05/24/2011 1443 Full Code [id number]  Pecola Leisure, RN Inpatient        IV Access:    Peripheral IV   Procedures and diagnostic studies:   Dg Abd 2 Views  Result Date: 09/17/2017 CLINICAL DATA:  Follow-up sigmoid colon volvulus EXAM: ABDOMEN - 2 VIEW COMPARISON:  09/16/2017 FINDINGS: Prior cholecystectomy. NG tube has been removed. Marked gaseous distention of the colon has slightly increased since prior study. This is most pronounced in the sigmoid colon region. No free air organomegaly. IMPRESSION: Marked gaseous distention of the colon, particularly sigmoid colon, increasing since prior study compatible with sigmoid volvulus. Removal of NG tube. Electronically Signed   By: Rolm Baptise M.D.   On: 09/17/2017 09:07     Medical Consultants:    None.  Anti-Infectives:   None  Subjective:    Maria Christian she relates no acute findings, she had multiple bowel movements a day. Objective:    Vitals:   09/17/17 1138 09/17/17 1436 09/17/17 2126 09/18/17 0544  BP: (!) 146/72 (!) 157/61 (!) 155/66 (!) 113/58  Pulse: 66 65 63 93  Resp: 18 18 18  (!) 22  Temp: 98.2 F (36.8 C) 98.1 F (36.7 C) 97.7 F (36.5 C) 97.6 F (36.4 C)  TempSrc: Oral Oral Oral Oral  SpO2: 100% 96% 97% 98%  Weight:      Height:        Intake/Output Summary (Last 24 hours) at 09/18/2017 1138 Last data filed at 09/18/2017 0400 Gross per 24 hour  Intake 1530 ml  Output 6 ml  Net 1524 ml   Filed Weights   09/15/17 2200 09/16/17 1105  Weight: 71 kg (156 lb 8.4 oz) 71 kg (156 lb 8.4 oz)    Exam: General exam: In no acute distress, extremely dry mucous membranes. Respiratory system: Good air movement and clear to auscultation. Cardiovascular system: S1 & S2 heard, RRR.  Gastrointestinal system: Soft but  more distended today nontender to palpation positive bowel sounds Central nervous system: Alert and oriented. No focal neurological deficits. Extremities: No pedal edema. Skin: No rashes, lesions or ulcers    Data Reviewed:    Labs: Basic Metabolic Panel: Recent Labs  Lab 09/15/17 1650 09/15/17 1654 09/16/17 0328 09/17/17 0303 09/18/17 0418  NA 140 140 139 141 139  K 4.0 4.0 4.4 3.3* 3.2*  CL 109 109 110 115* 116*  CO2 21*  --  21* 18* 16*  GLUCOSE 175* 171* 179* 128* 112*  BUN 43* 38* 41* 36* 28*  CREATININE 2.17* 2.10* 2.07* 1.70* 1.43*  CALCIUM 9.4  --  8.4* 8.5* 8.3*   GFR Estimated Creatinine Clearance: 25.8 mL/min (A) (by C-G formula based on SCr of 1.43 mg/dL (H)). Liver Function Tests: Recent Labs  Lab 09/15/17 1650  AST 19  ALT 12*  ALKPHOS 99  BILITOT 0.8  PROT 8.1  ALBUMIN 3.9   No results for input(s): LIPASE, AMYLASE  in the last 168 hours. No results for input(s): AMMONIA in the last 168 hours. Coagulation profile No results for input(s): INR, PROTIME in the last 168 hours.  CBC: Recent Labs  Lab 09/15/17 1650 09/15/17 1654 09/16/17 0328 09/17/17 0303  WBC 7.8  --  9.1 6.3  NEUTROABS 5.8  --   --  4.4  HGB 10.7* 11.6* 9.7* 9.4*  HCT 33.7* 34.0* 30.8* 30.4*  MCV 106.3*  --  108.5* 109.7*  PLT 104*  --  95* 81*   Cardiac Enzymes: No results for input(s): CKTOTAL, CKMB, CKMBINDEX, TROPONINI in the last 168 hours. BNP (last 3 results) No results for input(s): PROBNP in the last 8760 hours. CBG: Recent Labs  Lab 09/16/17 1131  GLUCAP 144*   D-Dimer: No results for input(s): DDIMER in the last 72 hours. Hgb A1c: No results for input(s): HGBA1C in the last 72 hours. Lipid Profile: No results for input(s): CHOL, HDL, LDLCALC, TRIG, CHOLHDL, LDLDIRECT in the last 72 hours. Thyroid function studies: No results for input(s): TSH, T4TOTAL, T3FREE, THYROIDAB in the last 72 hours.  Invalid input(s): FREET3 Anemia work up: No results for  input(s): VITAMINB12, FOLATE, FERRITIN, TIBC, IRON, RETICCTPCT in the last 72 hours. Sepsis Labs: Recent Labs  Lab 09/15/17 1650 09/15/17 1655 09/16/17 0328 09/17/17 0303  WBC 7.8  --  9.1 6.3  LATICACIDVEN  --  1.11  --   --    Microbiology Recent Results (from the past 240 hour(s))  MRSA PCR Screening     Status: None   Collection Time: 09/15/17 10:10 PM  Result Value Ref Range Status   MRSA by PCR NEGATIVE NEGATIVE Final    Comment:        The GeneXpert MRSA Assay (FDA approved for NASAL specimens only), is one component of a comprehensive MRSA colonization surveillance program. It is not intended to diagnose MRSA infection nor to guide or monitor treatment for MRSA infections. Performed at [facility], [place] [address] [place], [place] [address]      Medications:   . amLODipine  2.5 mg Oral Daily  . diclofenac sodium  2 g Topical BID  . dorzolamide  1 drop Both Eyes TID  . latanoprost  1 drop Both Eyes QHS  . lidocaine  15 mL Mouth/Throat Once  . nystatin  1 g Topical BID  . polyethylene glycol  17 g Oral BID   Continuous Infusions: . famotidine (PEPCID) IV Stopped (09/17/17 2335)  . sodium chloride 0.45 % 1,000 mL with potassium chloride 40 mEq infusion 75 mL/hr at 09/17/17 0936      LOS: 2 days   Charlynne Cousins  [facility] Pager [phone]  *Please refer to [email or web address], password [email or web address] to get updated schedule on who will round on this patient, as hospitalists switch teams weekly. If 7PM-7AM, please contact night-coverage at [email or web address], password [email or web address] for any overnight needs.  09/18/2017, 11:38 AM

## 2017-09-18 NOTE — Progress Notes (Addendum)
     [facility] Progress Note   Chief Complaint:   Sigmoid volvulus  SUBJECTIVE:    says she feels okay today but has dementia so history not reliable   ASSESSMENT AND PLAN:   14. 84 yo female admitted redundant sigmoid colon / hx of chronic intermittent sigmoid volvulus. Apparently declined surgery in past. Now readmitted with recurrent sigmoid volvulus, s/p endoscopic decompression on 4/13. She improved clinically and radiographically. There is concern from RN and Hospitalist today that patient getting distended again. This is my first time seeing her. She is distended with abnormal bowel sounds but I don't know how much of that is chronic. Denies significant pain, not really tender, no vomiting and she just had a huge watery BM so not sure she needs another decompression at this point.  -await.repeat abdominal films  Addendum: not much improvement radiographically. Will proceed with repeat decompressive colonoscopy today  OBJECTIVE:      Vital signs in last 24 hours: Temp:  [97.6 F (36.4 C)-98.1 F (36.7 C)] 97.6 F (36.4 C) (04/16 0544) Pulse Rate:  [63-93] 93 (04/16 0544) Resp:  [18-22] 22 (04/16 0544) BP: (113-157)/(58-66) 113/58 (04/16 0544) SpO2:  [96 %-98 %] 98 % (04/16 0544) Last BM Date: 09/17/17 General:   Alert, white female in NAD EENT:  HOH, conjunctive pink.  Heart:  Regular rate and rhythm; + murmur, no lower extremity edema Pulm: Normal respiratory effort Abdomen:  Soft, mod distended, nontender. Abnormal bowel sounds Neuro: grossly normal neurologically. Psych:  Pleasant, cooperative.  Normal mood and affect.   Intake/Output from previous day: 04/15 0701 - 04/16 0700 In: 1680 [P.O.:100; I.V.:1530; IV Piggyback:50] Out: 156 [Urine:153; Stool:3] Intake/Output this shift: No intake/output data recorded.  Lab Results: Recent Labs    09/15/17 1650 09/15/17 1654 09/16/17 0328 09/17/17 0303  WBC 7.8  --  9.1 6.3  HGB 10.7* 11.6* 9.7*  9.4*  HCT 33.7* 34.0* 30.8* 30.4*  PLT 104*  --  95* 81*   BMET Recent Labs    09/16/17 0328 09/17/17 0303 09/18/17 0418  NA 139 141 139  K 4.4 3.3* 3.2*  CL 110 115* 116*  CO2 21* 18* 16*  GLUCOSE 179* 128* 112*  BUN 41* 36* 28*  CREATININE 2.07* 1.70* 1.43*  CALCIUM 8.4* 8.5* 8.3*   LFT Recent Labs    09/15/17 1650  PROT 8.1  ALBUMIN 3.9  AST 19  ALT 12*  ALKPHOS 99  BILITOT 0.8     Dg Abd 2 Views  Result Date: 09/17/2017 CLINICAL DATA:  Follow-up sigmoid colon volvulus EXAM: ABDOMEN - 2 VIEW COMPARISON:  09/16/2017 FINDINGS: Prior cholecystectomy. NG tube has been removed. Marked gaseous distention of the colon has slightly increased since prior study. This is most pronounced in the sigmoid colon region. No free air organomegaly. IMPRESSION: Marked gaseous distention of the colon, particularly sigmoid colon, increasing since prior study compatible with sigmoid volvulus. Removal of NG tube. Electronically Signed   By: Rolm Baptise M.D.   On: 09/17/2017 09:07    Principal Problem:   Sigmoid volvulus ([place]) Active Problems:   Thrombocytopenia ([place])   Dementia with behavioral disturbance   Normocytic anemia   Chronic atrial fibrillation (HCC)   Acute kidney injury superimposed on chronic kidney disease (HCC)   GERD (gastroesophageal reflux disease)     LOS: 2 days   Tye Savoy ,NP 09/18/2017, 12:01 PM  Pager number [phone]

## 2017-09-19 ENCOUNTER — Inpatient Hospital Stay (HOSPITAL_COMMUNITY): Payer: Medicare Other

## 2017-09-19 ENCOUNTER — Encounter (HOSPITAL_COMMUNITY): Payer: Self-pay | Admitting: Gastroenterology

## 2017-09-19 LAB — BASIC METABOLIC PANEL
Anion gap: 7 (ref 5–15)
BUN: 24 mg/dL — ABNORMAL HIGH (ref 6–20)
CO2: 17 mmol/L — ABNORMAL LOW (ref 22–32)
CREATININE: 1.25 mg/dL — AB (ref 0.44–1.00)
Calcium: 8.6 mg/dL — ABNORMAL LOW (ref 8.9–10.3)
Chloride: 119 mmol/L — ABNORMAL HIGH (ref 101–111)
GFR, EST AFRICAN AMERICAN: 43 mL/min — AB (ref >60)
GFR, EST NON AFRICAN AMERICAN: 37 mL/min — AB (ref >60)
Glucose, Bld: 136 mg/dL — ABNORMAL HIGH (ref 65–99)
POTASSIUM: 3.1 mmol/L — AB (ref 3.5–5.1)
SODIUM: 143 mmol/L (ref 135–145)

## 2017-09-19 MED ORDER — POTASSIUM CHLORIDE 10 MEQ/100ML IV SOLN
10.0000 meq | INTRAVENOUS | Status: AC
  Administered 2017-09-19 (×4): 10 meq via INTRAVENOUS
  Filled 2017-09-19: qty 100

## 2017-09-19 NOTE — Progress Notes (Signed)
CM continuing to follow along to assist with DC planning as needed. Current DC plan is for pt to return to [facility] SNF at discharge. Marney Doctor RN,BSN,NCM [phone]

## 2017-09-19 NOTE — Care Management Important Message (Signed)
Important Message  Patient Details  Name: Maria Christian MRN: [id number] Date of Birth: 01/19/1925   Medicare Important Message Given:  Yes    Kerin Salen 09/19/2017, 11:39 AMImportant Message  Patient Details  Name: Maria Christian MRN: [id number] Date of Birth: November 17, 1924   Medicare Important Message Given:  Yes    Kerin Salen 09/19/2017, 11:39 AM

## 2017-09-19 NOTE — Progress Notes (Signed)
PROGRESS NOTE  Maria Christian WEX:[id number] DOB: [birth year] DOA: 09/15/2017 PCP: [name], DO  HPI/Recap of past 24 hours: Maria Christian is an 84 y.o. female with past medical history of HTN,  A. fib chronic diastolic heart failure, recurrent sigmoid volvulus with 5 episodes since 2014 (patient's family refusing surgical intervention), who presents with abdominal pain, N/V for 3 days prior to admission. Pt was found to have sigmoid volvulus. Pt admitted for further management.  Today, patient reported being thirsty, denies any worsening abdominal pain, nausea/vomiting, fever/chills, chest pain, worsening shortness of breath.  Patient requesting for water.   Assessment/Plan: Principal Problem:   Sigmoid volvulus (HCC) Active Problems:   Thrombocytopenia (HCC)   Dementia with behavioral disturbance   Normocytic anemia   Chronic atrial fibrillation (HCC)   Acute kidney injury superimposed on chronic kidney disease (HCC)   GERD (gastroesophageal reflux disease)  Recurrent Sigmoid volvulus S/P decompression X 2 by GI on 4/15 and 4/16 GI on board: Last colonoscopy done on 4/16 showed an area of ulceration in the colon, representing possible ischemia.  Biopsies taken and pending Repeat abd x-ray today showed slight increase in sigmoid distention Advance diet to clears, re-start MiraLAX Surgery has been recommended by both I and GI, family continues to decline despite educating them on high risk of recurrence Daily abdominal x-ray  Acute kidney injury on chronic disease stage III Improving Likely prerenal in etiology Baseline creatinine around 1 on admission 2.1 Daily BMP  Hypokalemia Will repeat prn  ?Chronic respiratory failure with hypoxia Currently on room air, sat well On oxygen at nursing home, monitor closely  Chronic headache/?? Migraine Ongoing for years, worsening over the past couple of months as per daughter CT scan of the head on 2018 showed no acute  abnormalities Patient has an appointment with neurology outpatient on Oct 10, 2017, daughter  insisting patient has MRI prior to the visit Continue Excedrin Migraine  Chronic atrial fibrillation Rate controlled  No anticoagulation likely due to risk of bleeding/age  Macrocytic anemia: Hgb at baseline Vit B 12 on 9/18 685, folate WNL in 2015, will repeat   Chronic diastolic heart failure Appears to be euvolemic Hold Lasix for now  Chronic thrombocytopenia Appears to be stable  Dementia with behavioral disturbance Seroquel at night, IV Haldol as needed    Code Status: DNR  Family Communication: Daughter at bedside  Disposition Plan: Back to SNF once management is complete   Consultants:  GI  Procedures:  Colonoscopy on 09/18/17  Antimicrobials:  None  DVT prophylaxis: SCDs   Objective: Vitals:   09/18/17 1646 09/18/17 1650 09/18/17 2123 09/19/17 0537  BP: (!) 132/36 (!) 137/39 (!) 156/57 (!) 152/62  Pulse: (!) 59 60 69 75  Resp: 16 17  12   Temp: 98 F (36.7 C)  99.2 F (37.3 C) 98.9 F (37.2 C)  TempSrc: Oral  Oral Oral  SpO2: 99% 100% 93% 93%  Weight:      Height:        Intake/Output Summary (Last 24 hours) at 09/19/2017 1340 Last data filed at 09/19/2017 0558 Gross per 24 hour  Intake 250 ml  Output 400 ml  Net -150 ml   Filed Weights   09/15/17 2200 09/16/17 1105  Weight: 71 kg (156 lb 8.4 oz) 71 kg (156 lb 8.4 oz)    Exam:   General: NAD  Cardiovascular: S1, S2 present  Respiratory: CTAB  Abdomen: Soft, nontender, non-distended, hypoactive BS  Musculoskeletal: No pedal edema bilaterally  Skin: Normal  Psychiatry: Normal mood   Data Reviewed: CBC: Recent Labs  Lab 09/15/17 1650 09/15/17 1654 09/16/17 0328 09/17/17 0303  WBC 7.8  --  9.1 6.3  NEUTROABS 5.8  --   --  4.4  HGB 10.7* 11.6* 9.7* 9.4*  HCT 33.7* 34.0* 30.8* 30.4*  MCV 106.3*  --  108.5* 109.7*  PLT 104*  --  95* 81*   Basic Metabolic  Panel: Recent Labs  Lab 09/15/17 1650 09/15/17 1654 09/16/17 0328 09/17/17 0303 09/18/17 0418 09/19/17 0359  NA 140 140 139 141 139 143  K 4.0 4.0 4.4 3.3* 3.2* 3.1*  CL 109 109 110 115* 116* 119*  CO2 21*  --  21* 18* 16* 17*  GLUCOSE 175* 171* 179* 128* 112* 136*  BUN 43* 38* 41* 36* 28* 24*  CREATININE 2.17* 2.10* 2.07* 1.70* 1.43* 1.25*  CALCIUM 9.4  --  8.4* 8.5* 8.3* 8.6*   GFR: Estimated Creatinine Clearance: 29.6 mL/min (A) (by C-G formula based on SCr of 1.25 mg/dL (H)). Liver Function Tests: Recent Labs  Lab 09/15/17 1650  AST 19  ALT 12*  ALKPHOS 99  BILITOT 0.8  PROT 8.1  ALBUMIN 3.9   No results for input(s): LIPASE, AMYLASE in the last 168 hours. No results for input(s): AMMONIA in the last 168 hours. Coagulation Profile: No results for input(s): INR, PROTIME in the last 168 hours. Cardiac Enzymes: No results for input(s): CKTOTAL, CKMB, CKMBINDEX, TROPONINI in the last 168 hours. BNP (last 3 results) No results for input(s): PROBNP in the last 8760 hours. HbA1C: No results for input(s): HGBA1C in the last 72 hours. CBG: Recent Labs  Lab 09/16/17 1131  GLUCAP 144*   Lipid Profile: No results for input(s): CHOL, HDL, LDLCALC, TRIG, CHOLHDL, LDLDIRECT in the last 72 hours. Thyroid Function Tests: No results for input(s): TSH, T4TOTAL, FREET4, T3FREE, THYROIDAB in the last 72 hours. Anemia Panel: No results for input(s): VITAMINB12, FOLATE, FERRITIN, TIBC, IRON, RETICCTPCT in the last 72 hours. Urine analysis:    Component Value Date/Time   COLORURINE YELLOW 06/28/2017 1213   APPEARANCEUR TURBID (A) 06/28/2017 1213   LABSPEC 1.011 06/28/2017 1213   PHURINE 5.0 06/28/2017 1213   GLUCOSEU NEGATIVE 06/28/2017 1213   HGBUR MODERATE (A) 06/28/2017 1213   BILIRUBINUR NEGATIVE 06/28/2017 1213   KETONESUR NEGATIVE 06/28/2017 1213   PROTEINUR 100 (A) 06/28/2017 1213   UROBILINOGEN 0.2 03/09/2015 1352   NITRITE NEGATIVE 06/28/2017 1213    LEUKOCYTESUR MODERATE (A) 06/28/2017 1213   Sepsis Labs: @LABRCNTIP (procalcitonin:4,lacticidven:4)  ) Recent Results (from the past 240 hour(s))  MRSA PCR Screening     Status: None   Collection Time: 09/15/17 10:10 PM  Result Value Ref Range Status   MRSA by PCR NEGATIVE NEGATIVE Final    Comment:        The GeneXpert MRSA Assay (FDA approved for NASAL specimens only), is one component of a comprehensive MRSA colonization surveillance program. It is not intended to diagnose MRSA infection nor to guide or monitor treatment for MRSA infections. Performed at [facility], [place] [address], [place], [place] [address]       Studies: Dg Abd 1 View  Result Date: 09/19/2017 CLINICAL DATA:  History of volvulus EXAM: ABDOMEN - 1 VIEW COMPARISON:  09/18/2017 FINDINGS: Scattered large and small bowel gas is noted. Some air is noted within the sigmoid slightly increased from the prior exam although no obstructive changes are seen. No free air is noted. Stable degenerative change  of the lumbar spine is seen. IMPRESSION: Slight increase in distention of the sigmoid without obstructive change. Electronically Signed   By: Inez Catalina M.D.   On: 09/19/2017 07:20   Dg Abd 1 View  Result Date: 09/18/2017 CLINICAL DATA:  Sigmoid colon volvulus EXAM: ABDOMEN - 1 VIEW COMPARISON:  09/18/2017 FINDINGS: Interval decompression of the dilated loop of sigmoid colon seen on comparison exam. No intraperitoneal or retroperitoneal free air. IMPRESSION: Reduction/decompression of gas distended sigmoid colon. Electronically Signed   By: Suzy Bouchard M.D.   On: 09/18/2017 20:01    Scheduled Meds: . amLODipine  2.5 mg Oral Daily  . baclofen  5 mg Oral QHS  . calcium carbonate  2 tablet Oral TID  . diclofenac sodium  2 g Topical BID  . dorzolamide  1 drop Both Eyes TID  . gabapentin  100 mg Oral QHS  . latanoprost  1 drop Both Eyes QHS  . lidocaine  15 mL Mouth/Throat Once  . nystatin   1 g Topical BID  . polyethylene glycol  17 g Oral Daily  . potassium chloride SA  20 mEq Oral QHS  . potassium chloride  40 mEq Oral Daily    Continuous Infusions: . famotidine (PEPCID) IV Stopped (09/18/17 2235)     LOS: 3 days     Alma Friendly, MD [facility]   If 7PM-7AM, please contact night-coverage [email or web address] Password [email or web address] 09/19/2017, 1:40 PM

## 2017-09-19 NOTE — Progress Notes (Addendum)
     [facility] Progress Note   Chief Complaint:   volvulus   SUBJECTIVE:    visiting with daughter. No abdominal pain, nausea or other GI complaints. Thirsty   ASSESSMENT AND PLAN:   55. 84 yo female with recurrent sigmoid volvulus, s/p decompression 4/15 and again yesterday. Area of ulceration in colon (?transverse) and possibly representing ischemia.  Slight increase in sigmoid distention on KUB this am but her abdomen is soft, less distended today. Bowel sounds abnormal but not unexpected and likely chronic to some degree. -patient still NPO. Will give clears including Miralax.  -talked with daughter in the room. She understands that problem is chronic intermittent, likely to recur. She inquires about biopsies which are still pending. -Given high likelihood of this to keep recurring in addition to possible ischemic changes on colonoscopy , patient need need surgical evaluation for left hemicolectomy. Will resume miralax and monitor closely for now.  2. Hypokalemia, K+ 3.1.  -repletion per Hospitalist. Would keep K+ above 4 to avoid bowel dysmotility   OBJECTIVE:      Vital signs in last 24 hours: Temp:  [98 F (36.7 C)-99.2 F (37.3 C)] 98.9 F (37.2 C) (04/17 0537) Pulse Rate:  [54-125] 75 (04/17 0537) Resp:  [12-21] 12 (04/17 0537) BP: (118-169)/(36-105) 152/62 (04/17 0537) SpO2:  [93 %-100 %] 93 % (04/17 0537) Last BM Date: 09/17/17 General:   Alert, well-developed, white female in NAD EENT:  Normal hearing, non icteric sclera, conjunctive pink.  Heart:  Regular rate and rhythm, no lower extremity edema Pulm: Normal respiratory effor Abdomen:  Soft, mildly distended, nontender.  "metallic" bowel sounds.    Neurologic:  Alert , grossly normal neurologically. Psych:  Pleasant, cooperative.  Normal mood and affect.   Intake/Output from previous day: 04/16 0701 - 04/17 0700 In: 250 [IV Piggyback:250] Out: 400 [Urine:400] Intake/Output this  shift: No intake/output data recorded.  Lab Results: Recent Labs    09/17/17 0303  WBC 6.3  HGB 9.4*  HCT 30.4*  PLT 81*   BMET Recent Labs    09/17/17 0303 09/18/17 0418 09/19/17 0359  NA 141 139 143  K 3.3* 3.2* 3.1*  CL 115* 116* 119*  CO2 18* 16* 17*  GLUCOSE 128* 112* 136*  BUN 36* 28* 24*  CREATININE 1.70* 1.43* 1.25*  CALCIUM 8.5* 8.3* 8.6*     Principal Problem:   Sigmoid volvulus (HCC) Active Problems:   Thrombocytopenia ([place])   Dementia with behavioral disturbance   Normocytic anemia   Chronic atrial fibrillation (HCC)   Acute kidney injury superimposed on chronic kidney disease ([place])   GERD (gastroesophageal reflux disease)     LOS: 3 days   Tye Savoy ,NP 09/19/2017, 10:02 AM  Pager number [phone]

## 2017-09-20 ENCOUNTER — Inpatient Hospital Stay (HOSPITAL_COMMUNITY): Payer: Medicare Other

## 2017-09-20 LAB — CBC WITH DIFFERENTIAL/PLATELET
BASOS ABS: 0 10*3/uL (ref 0.0–0.1)
Basophils Relative: 0 %
EOS PCT: 2 %
Eosinophils Absolute: 0.1 10*3/uL (ref 0.0–0.7)
HCT: 29.5 % — ABNORMAL LOW (ref 36.0–46.0)
HEMOGLOBIN: 9.7 g/dL — AB (ref 12.0–15.0)
LYMPHS ABS: 1 10*3/uL (ref 0.7–4.0)
LYMPHS PCT: 19 %
MCH: 34.2 pg — AB (ref 26.0–34.0)
MCHC: 32.9 g/dL (ref 30.0–36.0)
MCV: 103.9 fL — AB (ref 78.0–100.0)
Monocytes Absolute: 0.8 10*3/uL (ref 0.1–1.0)
Monocytes Relative: 15 %
NEUTROS ABS: 3.5 10*3/uL (ref 1.7–7.7)
NEUTROS PCT: 64 %
PLATELETS: 105 10*3/uL — AB (ref 150–400)
RBC: 2.84 MIL/uL — AB (ref 3.87–5.11)
RDW: 14.6 % (ref 11.5–15.5)
WBC: 5.3 10*3/uL (ref 4.0–10.5)

## 2017-09-20 LAB — BASIC METABOLIC PANEL
Anion gap: 6 (ref 5–15)
BUN: 15 mg/dL (ref 6–20)
CALCIUM: 8.9 mg/dL (ref 8.9–10.3)
CO2: 18 mmol/L — ABNORMAL LOW (ref 22–32)
CREATININE: 1.09 mg/dL — AB (ref 0.44–1.00)
Chloride: 117 mmol/L — ABNORMAL HIGH (ref 101–111)
GFR calc Af Amer: 50 mL/min — ABNORMAL LOW (ref >60)
GFR, EST NON AFRICAN AMERICAN: 43 mL/min — AB (ref >60)
Glucose, Bld: 146 mg/dL — ABNORMAL HIGH (ref 65–99)
Potassium: 3.9 mmol/L (ref 3.5–5.1)
SODIUM: 141 mmol/L (ref 135–145)

## 2017-09-20 LAB — VITAMIN B12: VITAMIN B 12: 612 pg/mL (ref 180–914)

## 2017-09-20 MED ORDER — AMLODIPINE BESYLATE 5 MG PO TABS
5.0000 mg | ORAL_TABLET | Freq: Every day | ORAL | Status: DC
  Administered 2017-09-21: 5 mg via ORAL
  Filled 2017-09-20: qty 1

## 2017-09-20 NOTE — Progress Notes (Addendum)
PROGRESS NOTE  Maria Christian WIO:[id number] DOB: 23-Aug-1924 DOA: 09/15/2017 PCP: [name], DO  HPI/Recap of past 24 hours: Maria Christian is an 84 y.o. female with past medical history of HTN,  A. fib chronic diastolic heart failure, recurrent sigmoid volvulus with 5 episodes since 2014 (patient's family refusing surgical intervention), who presents with abdominal pain, N/V for 3 days prior to admission. Pt was found to have sigmoid volvulus. Pt admitted for further management.  Today, patient denies any worsening abdominal pain, nausea/vomiting, fever/chills.   Assessment/Plan: Principal Problem:   Sigmoid volvulus (HCC) Active Problems:   Thrombocytopenia (HCC)   Dementia with behavioral disturbance   Normocytic anemia   Chronic atrial fibrillation (HCC)   Acute kidney injury superimposed on chronic kidney disease (HCC)   GERD (gastroesophageal reflux disease)  Recurrent sigmoid volvulus S/P decompression X 2 by GI on 4/15 and 4/16 GI on board: Last colonoscopy done on 4/16 showed an area of ulceration in the colon, representing possible ischemia.  Biopsies showed colonic ulcer, no concern for malignancy Repeat abd x-ray today showed possible recurrence of sigmoid volvulus, GI aware  Advance diet to clears, re-start MiraLAX Surgery has been recommended by both I and GI, family continues to decline despite educating them on high risk of recurrence Gen surgery on board, Dr Excell Seltzer spoke to son POA who is still against surgery, but states he will discuss it with patient and get back to [name]. Will call again tomorrow, if son still against surgery, will offer palliative consult Daily abdominal x-ray  Acute kidney injury on chronic disease stage III Improving Likely prerenal in etiology Baseline creatinine around 1 on admission 2.1 Daily BMP  Hypokalemia Resolved  ?Chronic respiratory failure with hypoxia Currently on room air, sat well On oxygen at nursing home,  monitor closely  Chronic headache/?? Migraine Ongoing for years, worsening over the past couple of months as per daughter CT scan of the head on 2018 showed no acute abnormalities Patient has an appointment with neurology outpatient on Oct 10, 2017, daughter  insisting patient has MRI prior to the visit Continue Excedrin Migraine  Chronic atrial fibrillation Rate controlled  No anticoagulation likely due to risk of bleeding/age  Macrocytic anemia: Hgb at baseline Vit B 12 on 9/18 685, folate WNL in 2015, will repeat   Chronic diastolic heart failure Appears to be euvolemic Hold Lasix for now  Chronic thrombocytopenia Appears to be stable  Dementia with behavioral disturbance Seroquel at night, IV Haldol as needed    Code Status: DNR  Family Communication: None at bedside  Disposition Plan: Back to SNF once management is complete   Consultants:  GI  Gen surg  Procedures:  Colonoscopy on 09/18/17  Antimicrobials:  None  DVT prophylaxis: SCDs   Objective: Vitals:   09/19/17 2151 09/20/17 0616 09/20/17 0800 09/20/17 1528  BP: (!) 153/76 (!) 150/73 (!) 153/82 (!) 161/79  Pulse: 79 84 78 86  Resp: 18 18 18 18   Temp: 98 F (36.7 C) 99 F (37.2 C) 97.7 F (36.5 C) 98.6 F (37 C)  TempSrc: Oral Oral Oral Oral  SpO2: 99% 95% 95% 96%  Weight:      Height:        Intake/Output Summary (Last 24 hours) at 09/20/2017 1601 Last data filed at 09/20/2017 1200 Gross per 24 hour  Intake 420 ml  Output 560 ml  Net -140 ml   Filed Weights   09/15/17 2200 09/16/17 1105  Weight: 71 kg (156 lb  8.4 oz) 71 kg (156 lb 8.4 oz)    Exam:   General: NAD  Cardiovascular: S1, S2 present  Respiratory: CTAB  Abdomen: Soft, nontender, mildly distended, hypoactive BS  Musculoskeletal: RLE with pedal edema, none on the left  Skin: Normal  Psychiatry: Normal mood   Data Reviewed: CBC: Recent Labs  Lab 09/15/17 1650 09/15/17 1654 09/16/17 0328  09/17/17 0303 09/20/17 0350  WBC 7.8  --  9.1 6.3 5.3  NEUTROABS 5.8  --   --  4.4 3.5  HGB 10.7* 11.6* 9.7* 9.4* 9.7*  HCT 33.7* 34.0* 30.8* 30.4* 29.5*  MCV 106.3*  --  108.5* 109.7* 103.9*  PLT 104*  --  95* 81* 841*   Basic Metabolic Panel: Recent Labs  Lab 09/16/17 0328 09/17/17 0303 09/18/17 0418 09/19/17 0359 09/20/17 0350  NA 139 141 139 143 141  K 4.4 3.3* 3.2* 3.1* 3.9  CL 110 115* 116* 119* 117*  CO2 21* 18* 16* 17* 18*  GLUCOSE 179* 128* 112* 136* 146*  BUN 41* 36* 28* 24* 15  CREATININE 2.07* 1.70* 1.43* 1.25* 1.09*  CALCIUM 8.4* 8.5* 8.3* 8.6* 8.9   GFR: Estimated Creatinine Clearance: 33.9 mL/min (A) (by C-G formula based on SCr of 1.09 mg/dL (H)). Liver Function Tests: Recent Labs  Lab 09/15/17 1650  AST 19  ALT 12*  ALKPHOS 99  BILITOT 0.8  PROT 8.1  ALBUMIN 3.9   No results for input(s): LIPASE, AMYLASE in the last 168 hours. No results for input(s): AMMONIA in the last 168 hours. Coagulation Profile: No results for input(s): INR, PROTIME in the last 168 hours. Cardiac Enzymes: No results for input(s): CKTOTAL, CKMB, CKMBINDEX, TROPONINI in the last 168 hours. BNP (last 3 results) No results for input(s): PROBNP in the last 8760 hours. HbA1C: No results for input(s): HGBA1C in the last 72 hours. CBG: Recent Labs  Lab 09/16/17 1131  GLUCAP 144*   Lipid Profile: No results for input(s): CHOL, HDL, LDLCALC, TRIG, CHOLHDL, LDLDIRECT in the last 72 hours. Thyroid Function Tests: No results for input(s): TSH, T4TOTAL, FREET4, T3FREE, THYROIDAB in the last 72 hours. Anemia Panel: Recent Labs    09/20/17 0350  VITAMINB12 612   Urine analysis:    Component Value Date/Time   COLORURINE YELLOW 06/28/2017 1213   APPEARANCEUR TURBID (A) 06/28/2017 1213   LABSPEC 1.011 06/28/2017 1213   PHURINE 5.0 06/28/2017 1213   GLUCOSEU NEGATIVE 06/28/2017 1213   HGBUR MODERATE (A) 06/28/2017 1213   BILIRUBINUR NEGATIVE 06/28/2017 1213   KETONESUR  NEGATIVE 06/28/2017 1213   PROTEINUR 100 (A) 06/28/2017 1213   UROBILINOGEN 0.2 03/09/2015 1352   NITRITE NEGATIVE 06/28/2017 1213   LEUKOCYTESUR MODERATE (A) 06/28/2017 1213   Sepsis Labs: @LABRCNTIP (procalcitonin:4,lacticidven:4)  ) Recent Results (from the past 240 hour(s))  MRSA PCR Screening     Status: None   Collection Time: 09/15/17 10:10 PM  Result Value Ref Range Status   MRSA by PCR NEGATIVE NEGATIVE Final    Comment:        The GeneXpert MRSA Assay (FDA approved for NASAL specimens only), is one component of a comprehensive MRSA colonization surveillance program. It is not intended to diagnose MRSA infection nor to guide or monitor treatment for MRSA infections. Performed at [facility], [place] [address] [place], [place] [address]       Studies: No results found.  Scheduled Meds: . [START ON 09/21/2017] amLODipine  5 mg Oral Daily  . baclofen  5 mg Oral QHS  .  calcium carbonate  2 tablet Oral TID  . diclofenac sodium  2 g Topical BID  . dorzolamide  1 drop Both Eyes TID  . gabapentin  100 mg Oral QHS  . latanoprost  1 drop Both Eyes QHS  . lidocaine  15 mL Mouth/Throat Once  . nystatin  1 g Topical BID  . polyethylene glycol  17 g Oral Daily  . potassium chloride SA  20 mEq Oral QHS  . potassium chloride  40 mEq Oral Daily    Continuous Infusions: . famotidine (PEPCID) IV Stopped (09/19/17 2202)     LOS: 4 days     Alma Friendly, MD [facility]   If 7PM-7AM, please contact night-coverage [email or web address] Password [facility] 09/20/2017, 4:01 PM

## 2017-09-20 NOTE — Progress Notes (Signed)
[facility] Progress Note   Chief Complaint:   volvulus   SUBJECTIVE:    says stomach feels fine, feels like may have BM soon   ASSESSMENT AND PLAN:   84 yo female with recurrent sigmoid volvulus, s/p decompression twice this admission with ischemic changes seen in colon. Abdomen appears more distended today, bowel sounds more abnormal  -kub  -Son is POA. He has been opposed to surgical intervention, daughter would like to entertain it. This is likely to keep recurring and now with ischemic changes. I am contacting Surgery to ask them to evaluate her. At least family can hear Surgical options and make decision from there.    OBJECTIVE:     Vital signs in last 24 hours: Temp:  [97.7 F (36.5 C)-99 F (37.2 C)] 97.7 F (36.5 C) (04/18 0800) Pulse Rate:  [76-84] 78 (04/18 0800) Resp:  [18-20] 18 (04/18 0800) BP: (142-153)/(68-82) 153/82 (04/18 0800) SpO2:  [95 %-99 %] 95 % (04/18 0800) Last BM Date: 09/18/17 General:   Alert, well-developed,female in NAD Heart:  Regular rate and irregular rhythm, no lower extremity edema Pulm: Normal respiratory effort Abdomen:  Soft, nondistended, nontender.  Normal bowel sounds, no masses felt. No hepatomegaly.    Neurologic:  Alert , grossly normal neurologically. Psych:  Pleasant, cooperative.     Intake/Output from previous day: 04/17 0701 - 04/18 0700 In: 640 [P.O.:240] Out: 300 [Urine:300] Intake/Output this shift: Total I/O In: 120 [P.O.:120] Out: 260 [Urine:260]  Lab Results: Recent Labs    09/20/17 0350  WBC 5.3  HGB 9.7*  HCT 29.5*  PLT 105*   BMET Recent Labs    09/18/17 0418 09/19/17 0359 09/20/17 0350  NA 139 143 141  K 3.2* 3.1* 3.9  CL 116* 119* 117*  CO2 16* 17* 18*  GLUCOSE 112* 136* 146*  BUN 28* 24* 15  CREATININE 1.43* 1.25* 1.09*  CALCIUM 8.3* 8.6* 8.9    Dg Abd 1 View  Result Date: 09/19/2017 CLINICAL DATA:  History of volvulus EXAM: ABDOMEN - 1 VIEW COMPARISON:   09/18/2017 FINDINGS: Scattered large and small bowel gas is noted. Some air is noted within the sigmoid slightly increased from the prior exam although no obstructive changes are seen. No free air is noted. Stable degenerative change of the lumbar spine is seen. IMPRESSION: Slight increase in distention of the sigmoid without obstructive change. Electronically Signed   By: Inez Catalina M.D.   On: 09/19/2017 07:20   Dg Abd 1 View  Result Date: 09/18/2017 CLINICAL DATA:  Sigmoid colon volvulus EXAM: ABDOMEN - 1 VIEW COMPARISON:  09/18/2017 FINDINGS: Interval decompression of the dilated loop of sigmoid colon seen on comparison exam. No intraperitoneal or retroperitoneal free air. IMPRESSION: Reduction/decompression of gas distended sigmoid colon. Electronically Signed   By: Suzy Bouchard M.D.   On: 09/18/2017 20:01   Dg Abd 1 View  Addendum Date: 09/18/2017   ADDENDUM REPORT: 09/18/2017 13:28 ADDENDUM: Correction of the findings: The second sentence should read "the cecum appears to be medially positioned, and these findings again are consistent with sigmoid volvulus when compared to the prior CT which is unchanged. " Electronically Signed   By: Ivar Drape M.D.   On: 09/18/2017 13:28   Result Date: 09/18/2017 CLINICAL DATA:  History of volvulus EXAM: ABDOMEN - 1 VIEW COMPARISON:  Abdomen films of 09/17/2017 and CT abdomen pelvis of 09/15/2016 FINDINGS: The present gaseous pattern is similar to the CT scout film with gaseous distention of  the right colon and transverse colon. The cecum appears to be medially positioned, and these findings again are consistent with sickle lobular is which is unchanged. No definite mucosal edema or pneumatosis is seen. Surgical clips are present in the right quadrant from prior cholecystectomy. Left hip replacement is present. IMPRESSION: Bowel-gas pattern again is consistent with sigmoid volvulus when compared to the scout film from CT of the abdomen recently.  Electronically Signed: By: Ivar Drape M.D. On: 09/18/2017 13:12   Principal Problem:   Sigmoid volvulus (HCC) Active Problems:   Thrombocytopenia ([place])   Dementia with behavioral disturbance   Normocytic anemia   Chronic atrial fibrillation (HCC)   Acute kidney injury superimposed on chronic kidney disease ([place])   GERD (gastroesophageal reflux disease)     LOS: 4 days   Tye Savoy ,NP 09/20/2017, 9:51 AM  Pager number [phone]

## 2017-09-20 NOTE — Consult Note (Signed)
Reason for Consult:  Recurrent volvulus Referring Physician: Tye Savoy CC:  Abdominal pain  Maria Christian is an 84 y.o. female.  HPI: Patient is a 84 year old female who resides in a skilled nursing facility who presented with abdominal pain, associated nausea and constipation along with abdominal distention.  She was reported to have her last bowel movement approximately 3 days prior.  Admission history and physical notes that most of the information was obtained from the daughter.  Patient skilled nursing facility with multiple medical issues including dementia.  She has issues with ambulation is wheelchair bound.  She was diagnosed with a sigmoid volvulus and was seen in consultation for  this with Dr. Lyndel Safe, is also GI service.  He took the patient to the endoscopy and did a flexible sigmoidoscopy on 09/15/17, and then had to repeat the procedure again on 09/18/17.  Today she has on physical exam what looks like a recurrence of her volvulus.  Past Medical History:  Diagnosis Date  . Altered mental status   . Anemia   . Atrial fibrillation ([place])   . CKD (chronic kidney disease) stage 3, GFR 30-59 ml/min (HCC) 02/27/2015  . Constipation 02/27/2015  . Coronary artery disease   . Dementia   . Depression   . Diabetes mellitus   . Edema of lower extremity 07/13/11   right leg more swollen than left leg  . Encephalopathy   . Enlarged heart   . Esophageal dysmotility 07/02/12  . GERD (gastroesophageal reflux disease)   . History of adenomatous polyp of colon 06/24/99  . Horseshoe kidney   . Hyperlipemia   . Hypertension   . Pancreatic lesion 05/22/11   no further workup  per PCP/family due to age  . Parkinson disease ([place])   . Peripheral neuropathy   . Sigmoid volvulus ([place]) 05/24/2013  . Thrombophlebitis   . TIA (transient ischemic attack) 12/19/2012    Past Surgical History:  Procedure Laterality Date  . ABDOMINAL HYSTERECTOMY    . BOWEL DECOMPRESSION N/A 04/18/2016   Procedure:  BOWEL DECOMPRESSION;  Surgeon: [name], MD;  Location: [facility];  Service: Endoscopy;  Laterality: N/A;  . BREAST SURGERY     2 benign tumors removed left breast  . CHOLECYSTECTOMY    . COLONOSCOPY N/A 09/18/2017   Procedure: COLONOSCOPY;  Surgeon: Yetta Flock, MD;  Location: [facility];  Service: Gastroenterology;  Laterality: N/A;  for decompression  . ESOPHAGEAL DILATION     several times by Dr. [name]  . ESOPHAGOGASTRODUODENOSCOPY (EGD) WITH ESOPHAGEAL DILATION N/A 08/01/2012   Procedure: ESOPHAGOGASTRODUODENOSCOPY (EGD) WITH ESOPHAGEAL DILATION;  Surgeon: [name], MD;  Location: [facility];  Service: Endoscopy;  Laterality: N/A;  with c-arm savory dilators  . ESOPHAGOGASTRODUODENOSCOPY (EGD) WITH PROPOFOL N/A 03/12/2015   Procedure: ESOPHAGOGASTRODUODENOSCOPY (EGD) WITH PROPOFOL;  Surgeon: [name], MD;  [facility]: [facility];  Service: Endoscopy;  Laterality: N/A;  . FLEXIBLE SIGMOIDOSCOPY N/A 05/24/2013   Procedure: FLEXIBLE SIGMOIDOSCOPY;  Surgeon: [name], MD;  Location: [facility];  Service: Endoscopy;  Laterality: N/A;  . FLEXIBLE SIGMOIDOSCOPY N/A 05/26/2013   Procedure:  flex with decompression of sigmoid volvulus;  Surgeon: [name], MD;  Location: [facility];  Service: Endoscopy;  Laterality: N/A;  . FLEXIBLE SIGMOIDOSCOPY N/A 12/20/2015   Procedure: FLEXIBLE SIGMOIDOSCOPY;  Surgeon: Milus Banister, MD;  Location: [facility];  Service: Endoscopy;  Laterality: N/A;  . FLEXIBLE SIGMOIDOSCOPY N/A 04/17/2016   Procedure: FLEXIBLE SIGMOIDOSCOPY;  Surgeon: Pricilla Riffle  Fuller Plan, MD;  [facility];  Service: Endoscopy;  Laterality: N/A;  . FLEXIBLE SIGMOIDOSCOPY N/A 04/18/2016   Procedure: FLEXIBLE SIGMOIDOSCOPY;  Surgeon: [name], MD;  [facility];  Service: Endoscopy;  Laterality: N/A;  . FLEXIBLE SIGMOIDOSCOPY N/A 09/15/2017   Procedure: FLEXIBLE SIGMOIDOSCOPY;  Surgeon: Jackquline Denmark, MD;  [facility]: [facility];  Service: Endoscopy;  Laterality: N/A;  . FOOT SURGERY     benign tumors from foot  . NOSE SURGERY    . SHOULDER SURGERY  2001   left clavicle excision and acromioplasty  . TOTAL HIP ARTHROPLASTY      Family History  Problem Relation Age of Onset  . Cancer Unknown   . Heart disease Unknown     Social History:  reports that she has never smoked. She has never used smokeless tobacco. She reports that she does not drink alcohol or use drugs.  Allergies:  Allergies  Allergen Reactions  . Aspirin Other (See Comments)    G.I. Upset only    Medications:  Prior to Admission:  Medications Prior to Admission  Medication Sig Dispense Refill Last Dose  . amLODipine (NORVASC) 2.5 MG tablet Take 2.5 mg by mouth every morning. Hold for BP < 100/60 or HR < 60   09/15/2017 at Unknown time  . aspirin-acetaminophen-caffeine (EXCEDRIN MIGRAINE) 250-250-65 MG tablet Take 2 tablets by mouth every 12 (twelve) hours as needed for migraine.   09/04/2017  . baclofen (LIORESAL) 10 MG tablet Take 5 mg by mouth at bedtime. Take 1/2 of a 72m tablet.   09/14/2017 at Unknown time  . benzocaine-menthol (CHLORASEPTIC) 6-10 MG lozenge Take 1 lozenge by mouth as needed for sore throat.   Taking  . calcium carbonate (TUMS - DOSED IN MG ELEMENTAL CALCIUM) 500 MG chewable tablet Chew 2 tablets by mouth 3 (three) times daily.   09/15/2017 at Unknown time  . Cranberry 475 MG CAPS Take 1 capsule by mouth 2 (two) times daily.    09/15/2017 at Unknown time  . diclofenac sodium (VOLTAREN) 1 % GEL Apply 2 grams to neck topically three times a day for pain   09/15/2017 at Unknown time  . docusate sodium (COLACE) 100 MG capsule Take 200 mg by mouth at bedtime.    09/14/2017 at Unknown time  . dorzolamide (TRUSOPT) 2 % ophthalmic solution Place 1 drop into both eyes 3 (three) times daily.   09/15/2017 at Unknown time  . escitalopram (LEXAPRO) 10 MG tablet Take 10 mg by mouth daily.   09/15/2017 at Unknown time  . ferrous  sulfate 220 (44 Fe) MG/5ML solution Give 5 ml by mouth in the evening for supplement   09/14/2017 at Unknown time  . fluticasone (FLONASE) 50 MCG/ACT nasal spray Place 2 sprays into both nostrils at bedtime.   09/14/2017 at Unknown time  . gabapentin (NEURONTIN) 100 MG capsule Take 100 mg by mouth at bedtime.   09/14/2017 at Unknown time  . ipratropium-albuterol (DUONEB) 0.5-2.5 (3) MG/3ML SOLN Take 3 mLs by nebulization every 6 (six) hours as needed (wheezing/SOB for 14 days).     .Marland KitchenLinaclotide (LINZESS) 145 MCG CAPS capsule Take 145 mcg by mouth daily before breakfast.    09/15/2017 at Unknown time  . loratadine (CLARITIN) 10 MG tablet Take 10 mg by mouth daily before breakfast.    09/15/2017 at Unknown time  . Multiple Vitamins-Minerals (CERTAGEN PO) Take 1 tablet by mouth every evening.    09/14/2017 at Unknown time  . Multiple Vitamins-Minerals (HAIR  SKIN AND NAILS FORMULA) TABS Take by mouth. Give 2 tablets by mouth daily   09/15/2017 at Unknown time  . nystatin (MYCOSTATIN/NYSTOP) powder Apply 1 g topically 2 (two) times daily. Apply to perineal area topically two times a day for rash for 7 days.   09/15/2017 at Unknown time  . nystatin cream (MYCOSTATIN) Apply 1 application topically 2 (two) times daily. Apply to buttocks topically two times a day for redness    09/15/2017 at Unknown time  . omeprazole (PRILOSEC) 20 MG capsule Take 20 mg by mouth daily.   09/15/2017 at Unknown time  . ondansetron (ZOFRAN) 8 MG tablet Take 8 mg by mouth every 8 (eight) hours as needed for nausea or vomiting.   Taking  . Phenazopyridine HCl (AZO TABS PO) Take 1 tablet by mouth at bedtime.   09/14/2017 at Unknown time  . polyethylene glycol (MIRALAX / GLYCOLAX) packet Take 17 g by mouth daily.    09/15/2017 at Unknown time  . potassium chloride SA (K-DUR,KLOR-CON) 20 MEQ tablet Take 20-40 mEq by mouth 2 (two) times daily. Give 40 meq (2 tablets) in the morning and Give 20 meq (1 tablet) by mouth in the afternoon.   09/15/2017  at Unknown time  . senna (SENOKOT) 8.6 MG TABS tablet Take 1 tablet (8.6 mg total) by mouth 2 (two) times daily. 120 each 0 09/15/2017 at Unknown time  . simethicone (MYLICON) 048 MG chewable tablet Chew 125 mg by mouth every 6 (six) hours as needed for flatulence.     . sodium chloride (OCEAN) 0.65 % SOLN nasal spray Place 1 spray into both nostrils as directed. Four times daily And every 24 hours as needed to moisturize nasal passages.   Taking  . torsemide (DEMADEX) 20 MG tablet Take 20 mg by mouth daily.   09/15/2017 at Unknown time  . Travoprost, BAK Free, (TRAVATAN) 0.004 % SOLN ophthalmic solution Place 1 drop into both eyes daily at 8 pm.    09/14/2017 at Unknown time  . vitamin B-12 (CYANOCOBALAMIN) 500 MCG tablet Take 500 mcg by mouth every evening.    09/14/2017 at Unknown time   Scheduled: . amLODipine  2.5 mg Oral Daily  . baclofen  5 mg Oral QHS  . calcium carbonate  2 tablet Oral TID  . diclofenac sodium  2 g Topical BID  . dorzolamide  1 drop Both Eyes TID  . gabapentin  100 mg Oral QHS  . latanoprost  1 drop Both Eyes QHS  . lidocaine  15 mL Mouth/Throat Once  . nystatin  1 g Topical BID  . polyethylene glycol  17 g Oral Daily  . potassium chloride SA  20 mEq Oral QHS  . potassium chloride  40 mEq Oral Daily   GQB:[id number] **OR** acetaminophen, aspirin-acetaminophen-caffeine, benzocaine-menthol, haloperidol lactate, ipratropium-albuterol, ondansetron **OR** ondansetron (ZOFRAN) IV, phenol, QUEtiapine, sodium chloride Anti-infectives (From admission, onward)   None      Results for orders placed or performed during the hospital encounter of 09/15/17 (from the past 48 hour(s))  Basic metabolic panel     Status: Abnormal   Collection Time: 09/19/17  3:59 AM  Result Value Ref Range   Sodium 143 135 - 145 mmol/L   Potassium 3.1 (L) 3.5 - 5.1 mmol/L   Chloride 119 (H) 101 - 111 mmol/L   CO2 17 (L) 22 - 32 mmol/L   Glucose, Bld 136 (H) 65 - 99 mg/dL   BUN 24 (H) 6 - 20  mg/dL  Creatinine, Ser 1.25 (H) 0.44 - 1.00 mg/dL   Calcium 8.6 (L) 8.9 - 10.3 mg/dL   GFR calc non Af Amer 37 (L) >60 mL/min   GFR calc Af Amer 43 (L) >60 mL/min    Comment: (NOTE) The eGFR has been calculated using the CKD EPI equation. This calculation has not been validated in all clinical situations. eGFR's persistently <60 mL/min signify possible Chronic Kidney Disease.    Anion gap 7 5 - 15    Comment: Performed at [facility], [place] [address], [place], [place] [address]  CBC with Differential/Platelet     Status: Abnormal   Collection Time: 09/20/17  3:50 AM  Result Value Ref Range   WBC 5.3 4.0 - 10.5 K/uL   RBC 2.84 (L) 3.87 - 5.11 MIL/uL   Hemoglobin 9.7 (L) 12.0 - 15.0 g/dL   HCT 29.5 (L) 36.0 - 46.0 %   MCV 103.9 (H) 78.0 - 100.0 fL   MCH 34.2 (H) 26.0 - 34.0 pg   MCHC 32.9 30.0 - 36.0 g/dL   RDW 14.6 11.5 - 15.5 %   Platelets 105 (L) 150 - 400 K/uL    Comment: CONSISTENT WITH PREVIOUS RESULT   Neutrophils Relative % 64 %   Neutro Abs 3.5 1.7 - 7.7 K/uL   Lymphocytes Relative 19 %   Lymphs Abs 1.0 0.7 - 4.0 K/uL   Monocytes Relative 15 %   Monocytes Absolute 0.8 0.1 - 1.0 K/uL   Eosinophils Relative 2 %   Eosinophils Absolute 0.1 0.0 - 0.7 K/uL   Basophils Relative 0 %   Basophils Absolute 0.0 0.0 - 0.1 K/uL    Comment: Performed at [facility], [place] [address], [place], [place] [address]  Basic metabolic panel     Status: Abnormal   Collection Time: 09/20/17  3:50 AM  Result Value Ref Range   Sodium 141 135 - 145 mmol/L   Potassium 3.9 3.5 - 5.1 mmol/L    Comment: DELTA CHECK NOTED REPEATED TO VERIFY NO VISIBLE HEMOLYSIS    Chloride 117 (H) 101 - 111 mmol/L   CO2 18 (L) 22 - 32 mmol/L   Glucose, Bld 146 (H) 65 - 99 mg/dL   BUN 15 6 - 20 mg/dL   Creatinine, Ser 1.09 (H) 0.44 - 1.00 mg/dL   Calcium 8.9 8.9 - 10.3 mg/dL   GFR calc non Af Amer 43 (L) >60 mL/min   GFR calc Af Amer 50 (L) >60 mL/min    Comment:  (NOTE) The eGFR has been calculated using the CKD EPI equation. This calculation has not been validated in all clinical situations. eGFR's persistently <60 mL/min signify possible Chronic Kidney Disease.    Anion gap 6 5 - 15    Comment: Performed at [facility], [place] [address], [place], [place] [address]  Vitamin B12     Status: None   Collection Time: 09/20/17  3:50 AM  Result Value Ref Range   Vitamin B-12 612 180 - 914 pg/mL    Comment: (NOTE) This assay is not validated for testing neonatal or myeloproliferative syndrome specimens for Vitamin B12 levels. Performed at [facility], [place] [address] [place], [place] [address]     Dg Abd 1 View  Result Date: 09/19/2017 CLINICAL DATA:  History of volvulus EXAM: ABDOMEN - 1 VIEW COMPARISON:  09/18/2017 FINDINGS: Scattered large and small bowel gas is noted. Some air is noted within the sigmoid slightly increased from the  prior exam although no obstructive changes are seen. No free air is noted. Stable degenerative change of the lumbar spine is seen. IMPRESSION: Slight increase in distention of the sigmoid without obstructive change. Electronically Signed   By: Inez Catalina M.D.   On: 09/19/2017 07:20   Dg Abd 1 View  Result Date: 09/18/2017 CLINICAL DATA:  Sigmoid colon volvulus EXAM: ABDOMEN - 1 VIEW COMPARISON:  09/18/2017 FINDINGS: Interval decompression of the dilated loop of sigmoid colon seen on comparison exam. No intraperitoneal or retroperitoneal free air. IMPRESSION: Reduction/decompression of gas distended sigmoid colon. Electronically Signed   By: Suzy Bouchard M.D.   On: 09/18/2017 20:01   Dg Abd 1 View  Addendum Date: 09/18/2017   ADDENDUM REPORT: 09/18/2017 13:28 ADDENDUM: Correction of the findings: The second sentence should read "the cecum appears to be medially positioned, and these findings again are consistent with sigmoid volvulus when compared to the prior CT which is unchanged. "  Electronically Signed   By: Ivar Drape M.D.   On: 09/18/2017 13:28   Result Date: 09/18/2017 CLINICAL DATA:  History of volvulus EXAM: ABDOMEN - 1 VIEW COMPARISON:  Abdomen films of 09/17/2017 and CT abdomen pelvis of 09/15/2016 FINDINGS: The present gaseous pattern is similar to the CT scout film with gaseous distention of the right colon and transverse colon. The cecum appears to be medially positioned, and these findings again are consistent with sickle lobular is which is unchanged. No definite mucosal edema or pneumatosis is seen. Surgical clips are present in the right quadrant from prior cholecystectomy. Left hip replacement is present. IMPRESSION: Bowel-gas pattern again is consistent with sigmoid volvulus when compared to the scout film from CT of the abdomen recently. Electronically Signed: By: Ivar Drape M.D. On: 09/18/2017 13:12    Review of Systems  Unable to perform ROS: Other  Constitutional:       Pt has limited memory of her symptoms.  Knows she is in the hospital for her stomach.   Blood pressure (!) 153/82, pulse 78, temperature 97.7 F (36.5 C), temperature source Oral, resp. rate 18, height 5' 5"  (1.651 m), weight 71 kg (156 lb 8.4 oz), SpO2 95 %. Physical Exam  Constitutional:  Elderly female, with limited mobility.  No acute distress  HENT:  Head: Normocephalic and atraumatic.  Mouth/Throat: No oropharyngeal exudate.  Eyes: Right eye exhibits no discharge. Left eye exhibits no discharge. No scleral icterus.  Pupils are equal  Neck: Normal range of motion. Neck supple. No JVD present. No tracheal deviation present. No thyromegaly present.  Cardiovascular: Normal heart sounds and intact distal pulses.  No murmur heard. Slightly tachycardic, slightly irregular.    Respiratory: Effort normal and breath sounds normal. No respiratory distress. She has no wheezes. She has no rales. She exhibits no tenderness.  GI: Soft. She exhibits distension. She exhibits no mass. There  is no tenderness. There is no rebound and no guarding.  BS hypoacitve  Musculoskeletal: She exhibits no edema or tenderness.  Lymphadenopathy:    She has no cervical adenopathy.  Neurological: She is alert.  Pt awake and know where she is and that her stomach hurt.  Said she did not want surgery.  CN grossly normal Pt is wheelchair bound per H&P on admit at SNF  Skin: Skin is warm and dry. No rash noted. No erythema. No pallor.  Psychiatric:  Pt with reported dementia, and limited ability to relate symptoms.   Can't remember where her son is.  Assessment/Plan: Recurrent volvulus - she has had 5 flexible sigmoidoscopies since 2014, and 2 this admission Atrial fibrillation - not on anticoagulants Anemia Dementia CKD DNR  Plan:  Pt is extremely high risk for surgery.  Family has not been in agreement with surgery and patient after talking with Dr. Excell Seltzer reports she does not want surgery.  Dr. Horris Latino is going to talk with family and discuss again.  We will be available.       Jaskirat Schwieger 09/20/2017, 10:29 AM

## 2017-09-20 NOTE — Progress Notes (Signed)
PT Cancellation Note  Patient Details Name: Maria Christian MRN: [id number] DOB: Nov 05, 1924   Cancelled Treatment:    Reason Eval/Treat Not Completed: Patient at procedure or test/unavailable--getting bedside x-ray. Will check back another time.   Weston Anna, MPT Pager: [phone]

## 2017-09-21 LAB — FOLATE RBC
FOLATE, HEMOLYSATE: 314.9 ng/mL
Folate, RBC: 1075 ng/mL (ref >498)
Hematocrit: 29.3 % — ABNORMAL LOW (ref 34.0–46.6)

## 2017-09-21 LAB — CBC WITH DIFFERENTIAL/PLATELET
Basophils Absolute: 0 10*3/uL (ref 0.0–0.1)
Basophils Relative: 0 %
EOS ABS: 0.2 10*3/uL (ref 0.0–0.7)
EOS PCT: 5 %
HCT: 29 % — ABNORMAL LOW (ref 36.0–46.0)
Hemoglobin: 9.7 g/dL — ABNORMAL LOW (ref 12.0–15.0)
LYMPHS ABS: 1.4 10*3/uL (ref 0.7–4.0)
Lymphocytes Relative: 29 %
MCH: 34.4 pg — AB (ref 26.0–34.0)
MCHC: 33.4 g/dL (ref 30.0–36.0)
MCV: 102.8 fL — ABNORMAL HIGH (ref 78.0–100.0)
MONO ABS: 0.7 10*3/uL (ref 0.1–1.0)
Monocytes Relative: 14 %
Neutro Abs: 2.5 10*3/uL (ref 1.7–7.7)
Neutrophils Relative %: 52 %
PLATELETS: 113 10*3/uL — AB (ref 150–400)
RBC: 2.82 MIL/uL — ABNORMAL LOW (ref 3.87–5.11)
RDW: 14.6 % (ref 11.5–15.5)
WBC: 4.8 10*3/uL (ref 4.0–10.5)

## 2017-09-21 LAB — BASIC METABOLIC PANEL
Anion gap: 7 (ref 5–15)
BUN: 12 mg/dL (ref 6–20)
CALCIUM: 9.3 mg/dL (ref 8.9–10.3)
CO2: 19 mmol/L — ABNORMAL LOW (ref 22–32)
CREATININE: 1 mg/dL (ref 0.44–1.00)
Chloride: 116 mmol/L — ABNORMAL HIGH (ref 101–111)
GFR calc Af Amer: 56 mL/min — ABNORMAL LOW (ref >60)
GFR, EST NON AFRICAN AMERICAN: 48 mL/min — AB (ref >60)
GLUCOSE: 136 mg/dL — AB (ref 65–99)
Potassium: 4 mmol/L (ref 3.5–5.1)
SODIUM: 142 mmol/L (ref 135–145)

## 2017-09-21 MED ORDER — AMLODIPINE BESYLATE 10 MG PO TABS
10.0000 mg | ORAL_TABLET | Freq: Every day | ORAL | Status: DC
Start: 2017-09-22 — End: 2017-09-28
  Administered 2017-09-22 – 2017-09-26 (×5): 10 mg via ORAL
  Filled 2017-09-21 (×6): qty 1

## 2017-09-21 NOTE — Progress Notes (Signed)
CSW following to assist with disposition as pt is admitted from facility- [facility], where she is long term care SNF resident. Plan is to return home to SNF at [place]. Updated facility on pt's status today.  Sharren Bridge, MSW, LCSW Clinical Social Work 09/21/2017 [phone]

## 2017-09-21 NOTE — Progress Notes (Signed)
PROGRESS NOTE  Maria Christian UUE:[id number] DOB: July 30, 1924 DOA: 09/15/2017 PCP: [name], DO  HPI/Recap of past 24 hours: Maria Christian is an 84 y.o. female with past medical history of HTN,  A. fib chronic diastolic heart failure, recurrent sigmoid volvulus with 5 episodes since 2014 (patient's family refusing surgical intervention), who presents with abdominal pain, N/V for 3 days prior to admission. Pt was found to have sigmoid volvulus. Pt admitted for further management.  Today, patient denies any worsening abdominal pain, nausea/vomiting, fever/chills. Met pt daughters at bedside, wanting to speak with Gen surg.   Assessment/Plan: Principal Problem:   Sigmoid volvulus (HCC) Active Problems:   Thrombocytopenia (HCC)   Dementia with behavioral disturbance   Normocytic anemia   Chronic atrial fibrillation (HCC)   Acute kidney injury superimposed on chronic kidney disease (HCC)   GERD (gastroesophageal reflux disease)  Recurrent sigmoid volvulus S/P decompression X 2 by GI on 4/15 and 4/16 GI on board: Last colonoscopy done on 4/16 showed an area of ulceration in the colon, representing possible ischemia.  Biopsies showed colonic ulcer, no concern for malignancy Repeat abd x-ray today showed possible recurrence of sigmoid volvulus, GI aware  Advance diet to clears, re-start MiraLAX Surgery has been recommended by both I and GI, son/POA continues to decline despite educating him of high risk of recurrence Gen surgery on board, Dr Excell Seltzer spoke to son POA on 4/18 who is still against surgery, but states he will discuss it with patient and get back to [name].  Call son 4/19, family plans to give team a definitive answer 09/22/17. If they all refuse surgery, will offer palliative consult Abdominal x-ray prn  Acute kidney injury on chronic disease stage III Resolved Likely prerenal in etiology Baseline creatinine around 1 on admission 2.1 Daily  BMP  Hypokalemia Resolved  ?Chronic respiratory failure with hypoxia Currently on room air, sat well On oxygen at nursing home, monitor closely  Chronic headache/?? Migraine Ongoing for years, worsening over the past couple of months as per daughter CT scan of the head on 2018 showed no acute abnormalities Patient has an appointment with neurology outpatient on Oct 10, 2017, daughter  insisting patient has MRI prior to the visit Continue Excedrin Migraine  Chronic atrial fibrillation Rate controlled  No anticoagulation likely due to risk of bleeding/age  Macrocytic anemia: Hgb at baseline Vit B 12 on 9/18 685, folate WNL   Chronic diastolic heart failure Appears to be euvolemic Hold Lasix for now  Chronic thrombocytopenia Appears to be stable  Dementia with behavioral disturbance Seroquel at night, IV Haldol as needed    Code Status: DNR  Family Communication: Spoke to 2 of patient's daughters at bedside  Disposition Plan: Back to SNF once management is complete   Consultants:  GI  Gen surg  Procedures:  Colonoscopy on 09/18/17  Antimicrobials:  None  DVT prophylaxis: SCDs   Objective: Vitals:   09/20/17 1528 09/20/17 2101 09/21/17 0529 09/21/17 1020  BP: (!) 161/79 (!) 160/83 (!) 156/80 (!) 162/70  Pulse: 86 (!) 101 85 78  Resp: 18 20  20   Temp: 98.6 F (37 C) 98.8 F (37.1 C) 98.3 F (36.8 C) 99 F (37.2 C)  TempSrc: Oral Oral Oral Oral  SpO2: 96% 97% 95% 95%  Weight:      Height:        Intake/Output Summary (Last 24 hours) at 09/21/2017 1609 Last data filed at 09/20/2017 2100 Gross per 24 hour  Intake -  Output 300 ml  Net -300 ml   Filed Weights   09/15/17 2200 09/16/17 1105  Weight: 71 kg (156 lb 8.4 oz) 71 kg (156 lb 8.4 oz)    Exam:   General: NAD  Cardiovascular: S1, S2 present  Respiratory: CTAB  Abdomen: Soft, nontender, mildly distended, hypoactive BS  Musculoskeletal: RLE with pedal edema, none on the  left  Skin: Normal  Psychiatry: Normal mood   Data Reviewed: CBC: Recent Labs  Lab 09/15/17 1650 09/15/17 1654 09/16/17 0328 09/17/17 0303 09/20/17 0350 09/21/17 0447  WBC 7.8  --  9.1 6.3 5.3 4.8  NEUTROABS 5.8  --   --  4.4 3.5 2.5  HGB 10.7* 11.6* 9.7* 9.4* 9.7* 9.7*  HCT 33.7* 34.0* 30.8* 30.4* 29.5*  29.3* 29.0*  MCV 106.3*  --  108.5* 109.7* 103.9* 102.8*  PLT 104*  --  95* 81* 105* 025*   Basic Metabolic Panel: Recent Labs  Lab 09/17/17 0303 09/18/17 0418 09/19/17 0359 09/20/17 0350 09/21/17 0447  NA 141 139 143 141 142  K 3.3* 3.2* 3.1* 3.9 4.0  CL 115* 116* 119* 117* 116*  CO2 18* 16* 17* 18* 19*  GLUCOSE 128* 112* 136* 146* 136*  BUN 36* 28* 24* 15 12  CREATININE 1.70* 1.43* 1.25* 1.09* 1.00  CALCIUM 8.5* 8.3* 8.6* 8.9 9.3   GFR: Estimated Creatinine Clearance: 37 mL/min (by C-G formula based on SCr of 1 mg/dL). Liver Function Tests: Recent Labs  Lab 09/15/17 1650  AST 19  ALT 12*  ALKPHOS 99  BILITOT 0.8  PROT 8.1  ALBUMIN 3.9   No results for input(s): LIPASE, AMYLASE in the last 168 hours. No results for input(s): AMMONIA in the last 168 hours. Coagulation Profile: No results for input(s): INR, PROTIME in the last 168 hours. Cardiac Enzymes: No results for input(s): CKTOTAL, CKMB, CKMBINDEX, TROPONINI in the last 168 hours. BNP (last 3 results) No results for input(s): PROBNP in the last 8760 hours. HbA1C: No results for input(s): HGBA1C in the last 72 hours. CBG: Recent Labs  Lab 09/16/17 1131  GLUCAP 144*   Lipid Profile: No results for input(s): CHOL, HDL, LDLCALC, TRIG, CHOLHDL, LDLDIRECT in the last 72 hours. Thyroid Function Tests: No results for input(s): TSH, T4TOTAL, FREET4, T3FREE, THYROIDAB in the last 72 hours. Anemia Panel: Recent Labs    09/20/17 0350  VITAMINB12 612   Urine analysis:    Component Value Date/Time   COLORURINE YELLOW 06/28/2017 1213   APPEARANCEUR TURBID (A) 06/28/2017 1213   LABSPEC 1.011  06/28/2017 1213   PHURINE 5.0 06/28/2017 1213   GLUCOSEU NEGATIVE 06/28/2017 1213   HGBUR MODERATE (A) 06/28/2017 1213   BILIRUBINUR NEGATIVE 06/28/2017 1213   KETONESUR NEGATIVE 06/28/2017 1213   PROTEINUR 100 (A) 06/28/2017 1213   UROBILINOGEN 0.2 03/09/2015 1352   NITRITE NEGATIVE 06/28/2017 1213   LEUKOCYTESUR MODERATE (A) 06/28/2017 1213   Sepsis Labs: @LABRCNTIP (procalcitonin:4,lacticidven:4)  ) Recent Results (from the past 240 hour(s))  MRSA PCR Screening     Status: None   Collection Time: 09/15/17 10:10 PM  Result Value Ref Range Status   MRSA by PCR NEGATIVE NEGATIVE Final    Comment:        The GeneXpert MRSA Assay (FDA approved for NASAL specimens only), is one component of a comprehensive MRSA colonization surveillance program. It is not intended to diagnose MRSA infection nor to guide or monitor treatment for MRSA infections. Performed at [facility], [place] [address], [place], [place] [address]  Studies: No results found.  Scheduled Meds: . amLODipine  5 mg Oral Daily  . baclofen  5 mg Oral QHS  . calcium carbonate  2 tablet Oral TID  . diclofenac sodium  2 g Topical BID  . dorzolamide  1 drop Both Eyes TID  . gabapentin  100 mg Oral QHS  . latanoprost  1 drop Both Eyes QHS  . lidocaine  15 mL Mouth/Throat Once  . nystatin  1 g Topical BID  . polyethylene glycol  17 g Oral Daily  . potassium chloride SA  20 mEq Oral QHS  . potassium chloride  40 mEq Oral Daily    Continuous Infusions: . famotidine (PEPCID) IV Stopped (09/20/17 2324)     LOS: 5 days     Alma Friendly, MD [facility]   If 7PM-7AM, please contact night-coverage [email or web address] Password [email or web address] 09/21/2017, 4:09 PM

## 2017-09-22 LAB — BASIC METABOLIC PANEL
ANION GAP: 8 (ref 5–15)
BUN: 9 mg/dL (ref 6–20)
CHLORIDE: 117 mmol/L — AB (ref 101–111)
CO2: 19 mmol/L — ABNORMAL LOW (ref 22–32)
Calcium: 9.2 mg/dL (ref 8.9–10.3)
Creatinine, Ser: 0.93 mg/dL (ref 0.44–1.00)
GFR calc Af Amer: >60 mL/min (ref >60)
GFR calc non Af Amer: 53 mL/min — ABNORMAL LOW (ref >60)
Glucose, Bld: 138 mg/dL — ABNORMAL HIGH (ref 65–99)
POTASSIUM: 3.7 mmol/L (ref 3.5–5.1)
SODIUM: 144 mmol/L (ref 135–145)

## 2017-09-22 LAB — CBC WITH DIFFERENTIAL/PLATELET
Basophils Absolute: 0 10*3/uL (ref 0.0–0.1)
Basophils Relative: 0 %
EOS ABS: 0.3 10*3/uL (ref 0.0–0.7)
Eosinophils Relative: 6 %
HCT: 29.7 % — ABNORMAL LOW (ref 36.0–46.0)
HEMOGLOBIN: 9.7 g/dL — AB (ref 12.0–15.0)
LYMPHS PCT: 26 %
Lymphs Abs: 1.2 10*3/uL (ref 0.7–4.0)
MCH: 33.8 pg (ref 26.0–34.0)
MCHC: 32.7 g/dL (ref 30.0–36.0)
MCV: 103.5 fL — AB (ref 78.0–100.0)
Monocytes Absolute: 0.7 10*3/uL (ref 0.1–1.0)
Monocytes Relative: 15 %
NEUTROS PCT: 53 %
Neutro Abs: 2.4 10*3/uL (ref 1.7–7.7)
Platelets: 125 10*3/uL — ABNORMAL LOW (ref 150–400)
RBC: 2.87 MIL/uL — AB (ref 3.87–5.11)
RDW: 14.8 % (ref 11.5–15.5)
WBC: 4.5 10*3/uL (ref 4.0–10.5)

## 2017-09-22 NOTE — Progress Notes (Signed)
4 Days Post-Op   Subjective/Chief Complaint: No complaints yesterday. Had long discussion with pt and family yesterday and they decided against surgery   Objective: Vital signs in last 24 hours: Temp:  [97.7 F (36.5 C)-99 F (37.2 C)] 97.7 F (36.5 C) (04/20 0600) Pulse Rate:  [78-88] 88 (04/20 0600) Resp:  [16-20] 16 (04/20 0600) BP: (147-164)/(70-87) 147/87 (04/20 0600) SpO2:  [95 %-97 %] 95 % (04/20 0600) Last BM Date: 09/21/17  Intake/Output from previous day: 04/19 0701 - 04/20 0700 In: 450 [P.O.:450] Out: 1250 [Urine:1250] Intake/Output this shift: Total I/O In: -  Out: 450 [Urine:450]  General appearance: alert and cooperative Resp: clear to auscultation bilaterally Cardio: regular rate and rhythm GI: soft, distended. nontender. had bm's  Lab Results:  Recent Labs    09/21/17 0447 09/22/17 0327  WBC 4.8 4.5  HGB 9.7* 9.7*  HCT 29.0* 29.7*  PLT 113* 125*   BMET Recent Labs    09/21/17 0447 09/22/17 0327  NA 142 144  K 4.0 3.7  CL 116* 117*  CO2 19* 19*  GLUCOSE 136* 138*  BUN 12 9  CREATININE 1.00 0.93  CALCIUM 9.3 9.2   PT/INR No results for input(s): LABPROT, INR in the last 72 hours. ABG No results for input(s): PHART, HCO3 in the last 72 hours.  Invalid input(s): PCO2, PO2  Studies/Results: Dg Abd 1 View  Result Date: 09/20/2017 CLINICAL DATA:  Follow-up distension EXAM: ABDOMEN - 1 VIEW COMPARISON:  Abdominal x-rays dated 09/19/2017, 09/18/2017 and 09/17/2017. FINDINGS: There is again prominent distension of the sigmoid colon, similar to appearance on abdominal x-rays of 09/18/2017 and 09/17/2017, appeared improved on intervening exams of 09/18/2016 and 09/19/2017, now again most suggestive of sigmoid volvulus. No evidence of free intraperitoneal air or abnormal fluid collection. No acute or suspicious osseous finding IMPRESSION: Findings on today's study are again compatible with sigmoid volvulus, with prominent distension of the sigmoid  colon, today's appearance similar to earlier plain film exams of 09/18/2017 and 09/17/2017. The distension had improved on intervening exams of 09/18/2017 and 09/19/2017 (sigmoid volvulus recurrence?). These results will be called to the ordering clinician or representative by the Radiologist Assistant, and communication documented in the PACS or zVision Dashboard. Electronically Signed   By: Franki Cabot M.D.   On: 09/20/2017 15:59    Anti-infectives: Anti-infectives (From admission, onward)   None      Assessment/Plan: s/p Procedure(s) with comments: COLONOSCOPY (N/A) - for decompression No plan for surgery at this point. will sign off. call if situation changes  LOS: 6 days    TOTH III,Ellin Fitzgibbons S 09/22/2017

## 2017-09-22 NOTE — Progress Notes (Signed)
PROGRESS NOTE  Maria Christian CBJ:[id number] DOB: October 02, 1924 DOA: 09/15/2017 PCP: [name], DO  HPI/Recap of past 24 hours: Maria Christian is an 84 y.o. female with past medical history of HTN,  A. fib chronic diastolic heart failure, recurrent sigmoid volvulus with 5 episodes since 2014 (patient's family refusing surgical intervention), who presents with abdominal pain, N/V for 3 days prior to admission. Pt was found to have sigmoid volvulus. Pt admitted for further management.  Today, patient denies any worsening abdominal pain, nausea/vomiting, fever/chills. Pt noted to be more sleepy, but easily arousable. Family against surgery. Palliative team consulted    Assessment/Plan: Principal Problem:   Sigmoid volvulus ([place]) Active Problems:   Thrombocytopenia (HCC)   Dementia with behavioral disturbance   Normocytic anemia   Chronic atrial fibrillation (HCC)   Acute kidney injury superimposed on chronic kidney disease (HCC)   GERD (gastroesophageal reflux disease)  Recurrent sigmoid volvulus S/P decompression X 2 by GI on 4/15 and 4/16 GI on board: Last colonoscopy done on 4/16 showed an area of ulceration in the colon, representing possible ischemia.  Biopsies showed colonic ulcer, no concern for malignancy Repeat abd x-ray showed possible recurrence of sigmoid volvulus, GI aware, currently signed off  Advance diet to clears, re-start MiraLAX Gen surgery on board, spoke to son POA and family on 4/19, all agreed against surgery Palliative consult for further GOC  Acute kidney injury on chronic disease stage III Resolved Likely prerenal in etiology Baseline creatinine around 1 on admission 2.1 Daily BMP  Hypokalemia Resolved  ?Chronic respiratory failure with hypoxia Currently on room air, sat well On oxygen at nursing home, monitor closely  Chronic headache/?? Migraine Ongoing for years, worsening over the past couple of months as per daughter CT scan of the head  on 2018 showed no acute abnormalities Patient has an appointment with neurology outpatient on Oct 10, 2017, daughter  insisting patient has MRI prior to the visit Continue Excedrin Migraine  Chronic atrial fibrillation Rate controlled  No anticoagulation likely due to risk of bleeding/age  Macrocytic anemia: Hgb at baseline Vit B 12 on 9/18 685, folate WNL   Chronic diastolic heart failure Appears to be euvolemic Hold Lasix for now  Chronic thrombocytopenia Appears to be stable  Dementia with behavioral disturbance Seroquel at night, IV Haldol as needed    Code Status: DNR  Family Communication: None at bedside  Disposition Plan: Possibly back to SNF, palliative consulted   Consultants:  GI  Gen surg  Procedures:  Colonoscopy on 09/18/17  Antimicrobials:  None  DVT prophylaxis: SCDs   Objective: Vitals:   09/21/17 1020 09/21/17 2056 09/22/17 0600 09/22/17 1258  BP: (!) 162/70 (!) 164/75 (!) 147/87 (!) 152/68  Pulse: 78 88 88 87  Resp: 20 18 16    Temp: 99 F (37.2 C) 98.4 F (36.9 C) 97.7 F (36.5 C) 98.9 F (37.2 C)  TempSrc: Oral Oral Oral Oral  SpO2: 95% 97% 95% 95%  Weight:      Height:        Intake/Output Summary (Last 24 hours) at 09/22/2017 1425 Last data filed at 09/22/2017 1100 Gross per 24 hour  Intake 400 ml  Output 1250 ml  Net -850 ml   Filed Weights   09/15/17 2200 09/16/17 1105  Weight: 71 kg (156 lb 8.4 oz) 71 kg (156 lb 8.4 oz)    Exam:   General: NAD  Cardiovascular: S1, S2 present  Respiratory: CTAB  Abdomen: Soft, nontender, mildly distended, hypoactive  BS  Musculoskeletal: RLE with pedal edema, none on the left  Skin: Normal  Psychiatry: Normal mood   Data Reviewed: CBC: Recent Labs  Lab 09/15/17 1650  09/16/17 0328 09/17/17 0303 09/20/17 0350 09/21/17 0447 09/22/17 0327  WBC 7.8  --  9.1 6.3 5.3 4.8 4.5  NEUTROABS 5.8  --   --  4.4 3.5 2.5 2.4  HGB 10.7*   < > 9.7* 9.4* 9.7* 9.7* 9.7*    HCT 33.7*   < > 30.8* 30.4* 29.5*  29.3* 29.0* 29.7*  MCV 106.3*  --  108.5* 109.7* 103.9* 102.8* 103.5*  PLT 104*  --  95* 81* 105* 113* 125*   < > = values in this interval not displayed.   Basic Metabolic Panel: Recent Labs  Lab 09/18/17 0418 09/19/17 0359 09/20/17 0350 09/21/17 0447 09/22/17 0327  NA 139 143 141 142 144  K 3.2* 3.1* 3.9 4.0 3.7  CL 116* 119* 117* 116* 117*  CO2 16* 17* 18* 19* 19*  GLUCOSE 112* 136* 146* 136* 138*  BUN 28* 24* 15 12 9   CREATININE 1.43* 1.25* 1.09* 1.00 0.93  CALCIUM 8.3* 8.6* 8.9 9.3 9.2   GFR: Estimated Creatinine Clearance: 39.7 mL/min (by C-G formula based on SCr of 0.93 mg/dL). Liver Function Tests: Recent Labs  Lab 09/15/17 1650  AST 19  ALT 12*  ALKPHOS 99  BILITOT 0.8  PROT 8.1  ALBUMIN 3.9   No results for input(s): LIPASE, AMYLASE in the last 168 hours. No results for input(s): AMMONIA in the last 168 hours. Coagulation Profile: No results for input(s): INR, PROTIME in the last 168 hours. Cardiac Enzymes: No results for input(s): CKTOTAL, CKMB, CKMBINDEX, TROPONINI in the last 168 hours. BNP (last 3 results) No results for input(s): PROBNP in the last 8760 hours. HbA1C: No results for input(s): HGBA1C in the last 72 hours. CBG: Recent Labs  Lab 09/16/17 1131  GLUCAP 144*   Lipid Profile: No results for input(s): CHOL, HDL, LDLCALC, TRIG, CHOLHDL, LDLDIRECT in the last 72 hours. Thyroid Function Tests: No results for input(s): TSH, T4TOTAL, FREET4, T3FREE, THYROIDAB in the last 72 hours. Anemia Panel: Recent Labs    09/20/17 0350  VITAMINB12 612   Urine analysis:    Component Value Date/Time   COLORURINE YELLOW 06/28/2017 1213   APPEARANCEUR TURBID (A) 06/28/2017 1213   LABSPEC 1.011 06/28/2017 1213   PHURINE 5.0 06/28/2017 1213   GLUCOSEU NEGATIVE 06/28/2017 1213   HGBUR MODERATE (A) 06/28/2017 1213   BILIRUBINUR NEGATIVE 06/28/2017 1213   KETONESUR NEGATIVE 06/28/2017 1213   PROTEINUR 100 (A)  06/28/2017 1213   UROBILINOGEN 0.2 03/09/2015 1352   NITRITE NEGATIVE 06/28/2017 1213   LEUKOCYTESUR MODERATE (A) 06/28/2017 1213   Sepsis Labs: @LABRCNTIP (procalcitonin:4,lacticidven:4)  ) Recent Results (from the past 240 hour(s))  MRSA PCR Screening     Status: None   Collection Time: 09/15/17 10:10 PM  Result Value Ref Range Status   MRSA by PCR NEGATIVE NEGATIVE Final    Comment:        The GeneXpert MRSA Assay (FDA approved for NASAL specimens only), is one component of a comprehensive MRSA colonization surveillance program. It is not intended to diagnose MRSA infection nor to guide or monitor treatment for MRSA infections. Performed at [facility], [place] [address], [place], [place] [address]       Studies: No results found.  Scheduled Meds: . amLODipine  10 mg Oral Daily  . baclofen  5 mg Oral QHS  .  calcium carbonate  2 tablet Oral TID  . diclofenac sodium  2 g Topical BID  . dorzolamide  1 drop Both Eyes TID  . gabapentin  100 mg Oral QHS  . latanoprost  1 drop Both Eyes QHS  . lidocaine  15 mL Mouth/Throat Once  . nystatin  1 g Topical BID  . polyethylene glycol  17 g Oral Daily  . potassium chloride SA  20 mEq Oral QHS  . potassium chloride  40 mEq Oral Daily    Continuous Infusions: . famotidine (PEPCID) IV Stopped (09/21/17 2155)     LOS: 6 days     Alma Friendly, MD [facility]   If 7PM-7AM, please contact night-coverage [email or web address] Password [email or web address] 09/22/2017, 2:25 PM

## 2017-09-23 DIAGNOSIS — Z515 Encounter for palliative care: Secondary | ICD-10-CM

## 2017-09-23 DIAGNOSIS — Z7189 Other specified counseling: Secondary | ICD-10-CM

## 2017-09-23 LAB — CBC WITH DIFFERENTIAL/PLATELET
BASOS ABS: 0 10*3/uL (ref 0.0–0.1)
BASOS PCT: 1 %
EOS ABS: 0.3 10*3/uL (ref 0.0–0.7)
EOS PCT: 8 %
HCT: 30.2 % — ABNORMAL LOW (ref 36.0–46.0)
Hemoglobin: 9.9 g/dL — ABNORMAL LOW (ref 12.0–15.0)
Lymphocytes Relative: 24 %
Lymphs Abs: 1 10*3/uL (ref 0.7–4.0)
MCH: 34.4 pg — ABNORMAL HIGH (ref 26.0–34.0)
MCHC: 32.8 g/dL (ref 30.0–36.0)
MCV: 104.9 fL — ABNORMAL HIGH (ref 78.0–100.0)
MONO ABS: 0.6 10*3/uL (ref 0.1–1.0)
MONOS PCT: 14 %
Neutro Abs: 2.3 10*3/uL (ref 1.7–7.7)
Neutrophils Relative %: 53 %
PLATELETS: 143 10*3/uL — AB (ref 150–400)
RBC: 2.88 MIL/uL — ABNORMAL LOW (ref 3.87–5.11)
RDW: 15.1 % (ref 11.5–15.5)
WBC: 4.3 10*3/uL (ref 4.0–10.5)

## 2017-09-23 LAB — BASIC METABOLIC PANEL
ANION GAP: 8 (ref 5–15)
BUN: 8 mg/dL (ref 6–20)
CALCIUM: 9 mg/dL (ref 8.9–10.3)
CO2: 18 mmol/L — ABNORMAL LOW (ref 22–32)
CREATININE: 0.89 mg/dL (ref 0.44–1.00)
Chloride: 119 mmol/L — ABNORMAL HIGH (ref 101–111)
GFR, EST NON AFRICAN AMERICAN: 55 mL/min — AB (ref >60)
Glucose, Bld: 141 mg/dL — ABNORMAL HIGH (ref 65–99)
Potassium: 3.4 mmol/L — ABNORMAL LOW (ref 3.5–5.1)
SODIUM: 145 mmol/L (ref 135–145)

## 2017-09-23 NOTE — Progress Notes (Signed)
PROGRESS NOTE  Maria Christian YIF:[id number] DOB: 25-Feb-1925 DOA: 09/15/2017 PCP: [name], DO  HPI/Recap of past 24 hours: Maria Christian is an 84 y.o. female with past medical history of HTN,  A. fib chronic diastolic heart failure, recurrent sigmoid volvulus with 5 episodes since 2014 (patient's family refusing surgical intervention), who presents with abdominal pain, N/V for 3 days prior to admission. Pt was found to have sigmoid volvulus. Pt admitted for further management.  Today, patient denies any worsening abdominal pain, although noted abdomen more distended. No nausea/vomiting, fever/chills. Had a BM yesterday   Assessment/Plan: Principal Problem:   Sigmoid volvulus (HCC) Active Problems:   Thrombocytopenia (HCC)   Dementia with behavioral disturbance   Normocytic anemia   Chronic atrial fibrillation (HCC)   Acute kidney injury superimposed on chronic kidney disease (HCC)   GERD (gastroesophageal reflux disease)  Recurrent sigmoid volvulus S/P decompression X 2 by GI on 4/15 and 4/16 GI on board: Last colonoscopy done on 4/16 showed an area of ulceration in the colon, representing possible ischemia.  Biopsies showed colonic ulcer, no concern for malignancy Repeat abd x-ray on 4/18 showed possible recurrence of sigmoid volvulus, GI aware, currently signed off  Advance diet to clears, re-start MiraLAX Gen surgery on board, spoke to son POA and family on 4/19, all agreed against surgery Palliative team spoke to son 4/21, who is now considering surgery. Son is flying in from [place] tomorrow, plans to come visit 4/22. Palliative team will be following  Acute kidney injury on chronic disease stage III Resolved Likely prerenal in etiology Baseline creatinine around 1 on admission 2.1 Daily BMP  Hypokalemia Replace prn  ?Chronic respiratory failure with hypoxia Currently on room air, sat well On oxygen at nursing home, monitor closely  Chronic headache/??  Migraine Ongoing for years, worsening over the past couple of months as per daughter CT scan of the head on 2018 showed no acute abnormalities Patient has an appointment with neurology outpatient on Oct 10, 2017, daughter  insisting patient has MRI prior to the visit Continue Excedrin Migraine  Chronic atrial fibrillation Rate controlled  No anticoagulation likely due to risk of bleeding/age  Macrocytic anemia: Hgb at baseline Vit B 12 on 9/18 685, folate WNL   Chronic diastolic heart failure Appears to be euvolemic Hold Lasix for now  Chronic thrombocytopenia Appears to be stable  Dementia with behavioral disturbance Seroquel at night, IV Haldol as needed    Code Status: DNR  Family Communication: None at bedside  Disposition Plan: Possibly back to SNF, palliative consulted   Consultants:  GI  Gen surg  Palliative team  Procedures:  Colonoscopy on 09/18/17  Antimicrobials:  None  DVT prophylaxis: SCDs   Objective: Vitals:   09/22/17 1258 09/22/17 2253 09/23/17 0610 09/23/17 1309  BP: (!) 152/68 (!) 144/77 140/79 (!) 146/76  Pulse: 87 73 74 80  Resp:  16 11   Temp: 98.9 F (37.2 C) 98.2 F (36.8 C) 98.5 F (36.9 C) 97.6 F (36.4 C)  TempSrc: Oral Oral Oral Oral  SpO2: 95% 97% 95% 99%  Weight:      Height:        Intake/Output Summary (Last 24 hours) at 09/23/2017 1406 Last data filed at 09/23/2017 0700 Gross per 24 hour  Intake 300 ml  Output 500 ml  Net -200 ml   Filed Weights   09/15/17 2200 09/16/17 1105  Weight: 71 kg (156 lb 8.4 oz) 71 kg (156 lb 8.4 oz)  Exam:   General: NAD  Cardiovascular: S1, S2 present  Respiratory: CTAB  Abdomen: Soft, nontender, mildly distended, hyperactive BS around umbilicus  Musculoskeletal: RLE with pedal edema, none on the left  Skin: Normal  Psychiatry: Normal mood   Data Reviewed: CBC: Recent Labs  Lab 09/17/17 0303 09/20/17 0350 09/21/17 0447 09/22/17 0327  09/23/17 0417  WBC 6.3 5.3 4.8 4.5 4.3  NEUTROABS 4.4 3.5 2.5 2.4 2.3  HGB 9.4* 9.7* 9.7* 9.7* 9.9*  HCT 30.4* 29.5*  29.3* 29.0* 29.7* 30.2*  MCV 109.7* 103.9* 102.8* 103.5* 104.9*  PLT 81* 105* 113* 125* 161*   Basic Metabolic Panel: Recent Labs  Lab 09/19/17 0359 09/20/17 0350 09/21/17 0447 09/22/17 0327 09/23/17 0417  NA 143 141 142 144 145  K 3.1* 3.9 4.0 3.7 3.4*  CL 119* 117* 116* 117* 119*  CO2 17* 18* 19* 19* 18*  GLUCOSE 136* 146* 136* 138* 141*  BUN 24* 15 12 9 8   CREATININE 1.25* 1.09* 1.00 0.93 0.89  CALCIUM 8.6* 8.9 9.3 9.2 9.0   GFR: Estimated Creatinine Clearance: 41.5 mL/min (by C-G formula based on SCr of 0.89 mg/dL). Liver Function Tests: No results for input(s): AST, ALT, ALKPHOS, BILITOT, PROT, ALBUMIN in the last 168 hours. No results for input(s): LIPASE, AMYLASE in the last 168 hours. No results for input(s): AMMONIA in the last 168 hours. Coagulation Profile: No results for input(s): INR, PROTIME in the last 168 hours. Cardiac Enzymes: No results for input(s): CKTOTAL, CKMB, CKMBINDEX, TROPONINI in the last 168 hours. BNP (last 3 results) No results for input(s): PROBNP in the last 8760 hours. HbA1C: No results for input(s): HGBA1C in the last 72 hours. CBG: No results for input(s): GLUCAP in the last 168 hours. Lipid Profile: No results for input(s): CHOL, HDL, LDLCALC, TRIG, CHOLHDL, LDLDIRECT in the last 72 hours. Thyroid Function Tests: No results for input(s): TSH, T4TOTAL, FREET4, T3FREE, THYROIDAB in the last 72 hours. Anemia Panel: No results for input(s): VITAMINB12, FOLATE, FERRITIN, TIBC, IRON, RETICCTPCT in the last 72 hours. Urine analysis:    Component Value Date/Time   COLORURINE YELLOW 06/28/2017 1213   APPEARANCEUR TURBID (A) 06/28/2017 1213   LABSPEC 1.011 06/28/2017 1213   PHURINE 5.0 06/28/2017 1213   GLUCOSEU NEGATIVE 06/28/2017 1213   HGBUR MODERATE (A) 06/28/2017 1213   BILIRUBINUR NEGATIVE 06/28/2017 1213    KETONESUR NEGATIVE 06/28/2017 1213   PROTEINUR 100 (A) 06/28/2017 1213   UROBILINOGEN 0.2 03/09/2015 1352   NITRITE NEGATIVE 06/28/2017 1213   LEUKOCYTESUR MODERATE (A) 06/28/2017 1213   Sepsis Labs: @LABRCNTIP (procalcitonin:4,lacticidven:4)  ) Recent Results (from the past 240 hour(s))  MRSA PCR Screening     Status: None   Collection Time: 09/15/17 10:10 PM  Result Value Ref Range Status   MRSA by PCR NEGATIVE NEGATIVE Final    Comment:        The GeneXpert MRSA Assay (FDA approved for NASAL specimens only), is one component of a comprehensive MRSA colonization surveillance program. It is not intended to diagnose MRSA infection nor to guide or monitor treatment for MRSA infections. Performed at [facility], [place] [address], [place], [place] [address]       Studies: No results found.  Scheduled Meds: . amLODipine  10 mg Oral Daily  . baclofen  5 mg Oral QHS  . calcium carbonate  2 tablet Oral TID  . diclofenac sodium  2 g Topical BID  . dorzolamide  1 drop Both Eyes TID  . gabapentin  100 mg Oral QHS  . latanoprost  1 drop Both Eyes QHS  . lidocaine  15 mL Mouth/Throat Once  . nystatin  1 g Topical BID  . polyethylene glycol  17 g Oral Daily  . potassium chloride SA  20 mEq Oral QHS  . potassium chloride  40 mEq Oral Daily    Continuous Infusions: . famotidine (PEPCID) IV Stopped (09/22/17 2210)     LOS: 7 days     Alma Friendly, MD [facility]   If 7PM-7AM, please contact night-coverage [email or web address] Password [email or web address] 09/23/2017, 2:06 PM

## 2017-09-23 NOTE — Consult Note (Signed)
Consultation Note Date: 09/23/2017   Patient Name: Maria Christian  DOB: January 31, 1925  MRN: [id number]  Age / Sex: 84 y.o., female  PCP: [name], DO Referring Physician: Alma Friendly, MD  Reason for Consultation: Establishing goals of care  HPI/Patient Profile: 84 y.o. female  with past medical history of dementia?, HTN, A. Fib, CAD, diastolic CHF, recurrent sigmoid volvulus, peripheral neuropathy, depression admitted on 09/15/2017 with abd pain with recurrent sigmoid volulus with colonic decompression x 2. Has had continued nausea with intake although no abd pain today, abd distended but with bowel sounds.   Clinical Assessment and Goals of Care: I met today with Maria Christian and she has no family/visitors at bedside. Maria Christian shares with me that she is going to have surgery (which is not reflected in the notes). She tells me "I have too." She seems aware that she has a serious issue and that "it could kill me." She struggles with the details of this. She appears very pleasantly confused but mostly oriented. She has no abd pain and none to palpation either. She did say that she felt like she was going to vomit earlier when having jello. No eating much but only sipping on Sprite at this time. She talks of how nice the staff are to her and talks of her children; one son has passed on (she could not recall what he died from) and she says the last thing he said to her is that if he didn't see her again he would wait on her in heaven. She talks of dying fairly easily and seems to have strong faith and speaks of going to heaven. She also shares with me joking about her "boyfriend" (who is not really her boyfriend but she would like for him to be!) [name].   I called and spoke with HCPOA/son, Jerolyn Center, who is flying here from [place] and plans to be here to further discuss options with his mother tomorrow  afternoon ~3pm. Right now he confirms that he mother has expressed desire to proceed with surgery but he wishes to speak with her about this before we proceed. I will try and be here tomorrow afternoon if possible and if not Tues morning to help discuss options and help guide decisions.   Discussed with Dr. Horris Latino.   Primary Decision Maker PATIENT along with HCPOA/son [name]    SUMMARY OF RECOMMENDATIONS   - DNR well established - Appears pt now desiring surgery and family will confirm with her but will follow her wishes  Code Status/Advance Care Planning:  DNR   Symptom Management:   GI following for recurrent sigmoid volvulus   Surgery signed off but likely prudent to call back given pt/family now considering surgery  PRNs for nausea, pain, agitation in place but none utilized in days  Palliative Prophylaxis:   Aspiration, Bowel Regimen, Delirium Protocol and Frequent Pain Assessment  Additional Recommendations (Limitations, Scope, Preferences):  TBD  Psycho-social/Spiritual:   Desire for further Chaplaincy support:yes  Additional Recommendations: Caregiving  Support/Resources  Prognosis:   Unable to determine - dependent on desire for aggressiveness of care. Surgery risky in frail elderly but likely eligible for hospice without surgery. If surgery pursued with try and discuss further with pt/family GOC with differing outcomes and expectations from surgery.   Discharge Planning: To Be Determined      Primary Diagnoses: Present on Admission: . Sigmoid volvulus ([place]) . Acute kidney injury superimposed on chronic kidney disease ([place]) . Normocytic anemia . Chronic atrial fibrillation ([place]) . GERD (gastroesophageal reflux disease) . Thrombocytopenia ([place])   I have reviewed the medical record, interviewed the patient and family, and examined the patient. The following aspects are pertinent.  Past Medical History:  Diagnosis Date  . Altered mental status   .  Anemia   . Atrial fibrillation ([place])   . CKD (chronic kidney disease) stage 3, GFR 30-59 ml/min (HCC) 02/27/2015  . Constipation 02/27/2015  . Coronary artery disease   . Dementia   . Depression   . Diabetes mellitus   . Edema of lower extremity 07/13/11   right leg more swollen than left leg  . Encephalopathy   . Enlarged heart   . Esophageal dysmotility 07/02/12  . GERD (gastroesophageal reflux disease)   . History of adenomatous polyp of colon 06/24/99  . Horseshoe kidney   . Hyperlipemia   . Hypertension   . Pancreatic lesion 05/22/11   no further workup  per PCP/family due to age  . Parkinson disease ([place])   . Peripheral neuropathy   . Sigmoid volvulus ([place]) 05/24/2013  . Thrombophlebitis   . TIA (transient ischemic attack) 12/19/2012   Social History   Socioeconomic History  . Marital status: Widowed    Spouse name: Not on file  . Number of children: 5  . Years of education: 10th  . Highest education level: Not on file  Occupational History  . Occupation: Retired  Scientific laboratory technician  . Financial resource strain: Not on file  . Food insecurity:    Worry: Not on file    Inability: Not on file  . Transportation needs:    Medical: Not on file    Non-medical: Not on file  Tobacco Use  . Smoking status: Never Smoker  . Smokeless tobacco: Never Used  Substance and Sexual Activity  . Alcohol use: No  . Drug use: No  . Sexual activity: Never  Lifestyle  . Physical activity:    Days per week: Not on file    Minutes per session: Not on file  . Stress: Not on file  Relationships  . Social connections:    Talks on phone: Not on file    Gets together: Not on file    Attends religious service: Not on file    Active member of club or organization: Not on file    Attends meetings of clubs or organizations: Not on file    Relationship status: Not on file  Other Topics Concern  . Not on file  Social History Narrative   [facility] at [facility]   Caffeine Use: very small  amount daily   Family History  Problem Relation Age of Onset  . Cancer Unknown   . Heart disease Unknown    Scheduled Meds: . amLODipine  10 mg Oral Daily  . baclofen  5 mg Oral QHS  . calcium carbonate  2 tablet Oral TID  . diclofenac sodium  2 g Topical BID  . dorzolamide  1 drop Both Eyes TID  .  gabapentin  100 mg Oral QHS  . latanoprost  1 drop Both Eyes QHS  . lidocaine  15 mL Mouth/Throat Once  . nystatin  1 g Topical BID  . polyethylene glycol  17 g Oral Daily  . potassium chloride SA  20 mEq Oral QHS  . potassium chloride  40 mEq Oral Daily   Continuous Infusions: . famotidine (PEPCID) IV Stopped (09/22/17 2210)   PRN Meds:.acetaminophen **OR** acetaminophen, aspirin-acetaminophen-caffeine, benzocaine-menthol, haloperidol lactate, ipratropium-albuterol, ondansetron **OR** ondansetron (ZOFRAN) IV, phenol, QUEtiapine, sodium chloride Allergies  Allergen Reactions  . Aspirin Other (See Comments)    G.I. Upset only   Review of Systems  Constitutional: Positive for appetite change. Negative for activity change.  Respiratory: Negative for shortness of breath.   Gastrointestinal: Positive for abdominal distention and nausea.    Physical Exam  Constitutional: She appears well-developed.  HENT:  Head: Normocephalic and atraumatic.  Cardiovascular: Normal rate and regular rhythm.  Pulmonary/Chest: Effort normal. No accessory muscle usage. No tachypnea. No respiratory distress.  Abdominal: She exhibits distension. Bowel sounds are decreased. There is no tenderness.  Neurological: She is alert.  Mostly oriented and seems to understand situation  Nursing note and vitals reviewed.   Vital Signs: BP (!) 146/76 (BP Location: Right Arm)   Pulse 80   Temp 97.6 F (36.4 C) (Oral)   Resp 11   Ht 5' 5"  (1.651 m)   Wt 71 kg (156 lb 8.4 oz)   SpO2 99%   BMI 26.05 kg/m  Pain Scale: PAINAD   Pain Score: 0-No pain   SpO2: SpO2: 99 % O2 Device:SpO2: 99 % O2 Flow Rate:  .O2 Flow Rate (L/min): 2 L/min  IO: Intake/output summary:   Intake/Output Summary (Last 24 hours) at 09/23/2017 1338 Last data filed at 09/23/2017 0700 Gross per 24 hour  Intake 300 ml  Output 500 ml  Net -200 ml    LBM: Last BM Date: 09/21/17 Baseline Weight: Weight: 71 kg (156 lb 8.4 oz) Most recent weight: Weight: 71 kg (156 lb 8.4 oz)     Palliative Assessment/Data: 30%     Time In: 1245 Time Out: 1405 Time Total: 80 min Greater than 50%  of this time was spent counseling and coordinating care related to the above assessment and plan.  Signed by: [name], NP Palliative Medicine Team Pager # [phone] (M-F 8a-5p) Team Phone # [phone] (Nights/Weekends)

## 2017-09-24 ENCOUNTER — Inpatient Hospital Stay (HOSPITAL_COMMUNITY): Payer: Medicare Other

## 2017-09-24 LAB — CBC WITH DIFFERENTIAL/PLATELET
BASOS PCT: 1 %
Basophils Absolute: 0 10*3/uL (ref 0.0–0.1)
Eosinophils Absolute: 0.3 10*3/uL (ref 0.0–0.7)
Eosinophils Relative: 8 %
HEMATOCRIT: 29.4 % — AB (ref 36.0–46.0)
Hemoglobin: 9.5 g/dL — ABNORMAL LOW (ref 12.0–15.0)
LYMPHS PCT: 26 %
Lymphs Abs: 1 10*3/uL (ref 0.7–4.0)
MCH: 34.1 pg — AB (ref 26.0–34.0)
MCHC: 32.3 g/dL (ref 30.0–36.0)
MCV: 105.4 fL — ABNORMAL HIGH (ref 78.0–100.0)
MONO ABS: 0.4 10*3/uL (ref 0.1–1.0)
MONOS PCT: 11 %
NEUTROS ABS: 2 10*3/uL (ref 1.7–7.7)
Neutrophils Relative %: 54 %
Platelets: 136 10*3/uL — ABNORMAL LOW (ref 150–400)
RBC: 2.79 MIL/uL — ABNORMAL LOW (ref 3.87–5.11)
RDW: 15 % (ref 11.5–15.5)
WBC: 3.7 10*3/uL — ABNORMAL LOW (ref 4.0–10.5)

## 2017-09-24 LAB — BASIC METABOLIC PANEL
Anion gap: 9 (ref 5–15)
BUN: 6 mg/dL (ref 6–20)
CALCIUM: 8.9 mg/dL (ref 8.9–10.3)
CO2: 18 mmol/L — AB (ref 22–32)
Chloride: 117 mmol/L — ABNORMAL HIGH (ref 101–111)
Creatinine, Ser: 0.95 mg/dL (ref 0.44–1.00)
GFR calc Af Amer: 59 mL/min — ABNORMAL LOW (ref >60)
GFR calc non Af Amer: 51 mL/min — ABNORMAL LOW (ref >60)
GLUCOSE: 137 mg/dL — AB (ref 65–99)
Potassium: 3.5 mmol/L (ref 3.5–5.1)
Sodium: 144 mmol/L (ref 135–145)

## 2017-09-24 NOTE — Progress Notes (Signed)
PROGRESS NOTE  Maria Christian ENI:[id number] DOB: [birth year] DOA: 09/15/2017 PCP: [name], DO  HPI/Recap of past 24 hours: Maria Christian is an 84 y.o. female with past medical history of HTN,  A. fib chronic diastolic heart failure, recurrent sigmoid volvulus with 5 episodes since 2014 (patient's family refusing surgical intervention), who presents with abdominal pain, N/V for 3 days prior to admission. Pt was found to have sigmoid volvulus. Pt admitted for further management.  Today, patient denies any worsening abdominal pain, abd xray with persistent sigmoid volvulus, with abdomen more distended. No nausea/vomiting, fever/chills.   Assessment/Plan: Principal Problem:   Sigmoid volvulus (HCC) Active Problems:   Thrombocytopenia (HCC)   Dementia with behavioral disturbance   Normocytic anemia   Chronic atrial fibrillation (HCC)   Acute kidney injury superimposed on chronic kidney disease (HCC)   GERD (gastroesophageal reflux disease)  Recurrent sigmoid volvulus S/P decompression X 2 by GI on 4/15 and 4/16 GI on board: Last colonoscopy done on 4/16 showed an area of ulceration in the colon, representing possible ischemia.  Biopsies showed colonic ulcer, no concern for malignancy Repeat abd x-ray on 4/22 showed persistent recurrence of sigmoid volvulus GI signed off  Advance diet to clears, re-start MiraLAX Gen surgery on board, spoke to son POA and family on 4/19, all agreed against surgery Palliative team spoke to son 4/21, who is now considering surgery. Son is flying in from [place], plans to come visit 4/22. Palliative team will be following  Acute kidney injury on chronic disease stage III Resolved Likely prerenal in etiology Baseline creatinine around 1 on admission 2.1 Daily BMP  Hypokalemia Replace prn  ?Chronic respiratory failure with hypoxia Currently on room air, sat well On oxygen at nursing home, monitor closely  Chronic headache/??  Migraine Ongoing for years, worsening over the past couple of months as per daughter CT scan of the head on 2018 showed no acute abnormalities Patient has an appointment with neurology outpatient on Oct 10, 2017, daughter  insisting patient has MRI prior to the visit Continue Excedrin Migraine  Chronic atrial fibrillation Rate controlled  No anticoagulation likely due to risk of bleeding/age  Macrocytic anemia: Hgb at baseline Vit B 12 on 9/18 685, folate WNL   Chronic diastolic heart failure Appears to be euvolemic Hold Lasix for now  Chronic thrombocytopenia Appears to be stable  Dementia with behavioral disturbance Seroquel at night, IV Haldol as needed    Code Status: DNR  Family Communication: None at bedside  Disposition Plan: Possibly back to SNF, palliative consulted   Consultants:  GI  Gen surg  Palliative team  Procedures:  Colonoscopy on 09/18/17  Antimicrobials:  None  DVT prophylaxis: SCDs   Objective: Vitals:   09/23/17 0610 09/23/17 1309 09/23/17 2324 09/24/17 0518  BP: 140/79 (!) 146/76 (!) 151/79 (!) 144/67  Pulse: 74 80 (!) 54 71  Resp: 11  16 18   Temp: 98.5 F (36.9 C) 97.6 F (36.4 C) 97.6 F (36.4 C) 98.3 F (36.8 C)  TempSrc: Oral Oral Oral Oral  SpO2: 95% 99% 97% 97%  Weight:      Height:        Intake/Output Summary (Last 24 hours) at 09/24/2017 1548 Last data filed at 09/24/2017 0500 Gross per 24 hour  Intake 240 ml  Output 800 ml  Net -560 ml   Filed Weights   09/15/17 2200 09/16/17 1105  Weight: 71 kg (156 lb 8.4 oz) 71 kg (156 lb 8.4 oz)  Exam:   General: NAD  Cardiovascular: S1, S2 present  Respiratory: CTAB  Abdomen: Soft, nontender, mildly distended, hyperactive BS around umbilicus  Musculoskeletal: RLE with pedal edema, none on the left  Skin: Normal  Psychiatry: Normal mood   Data Reviewed: CBC: Recent Labs  Lab 09/20/17 0350 09/21/17 0447 09/22/17 0327 09/23/17 0417  09/24/17 0506  WBC 5.3 4.8 4.5 4.3 3.7*  NEUTROABS 3.5 2.5 2.4 2.3 2.0  HGB 9.7* 9.7* 9.7* 9.9* 9.5*  HCT 29.5*  29.3* 29.0* 29.7* 30.2* 29.4*  MCV 103.9* 102.8* 103.5* 104.9* 105.4*  PLT 105* 113* 125* 143* 102*   Basic Metabolic Panel: Recent Labs  Lab 09/20/17 0350 09/21/17 0447 09/22/17 0327 09/23/17 0417 09/24/17 0506  NA 141 142 144 145 144  K 3.9 4.0 3.7 3.4* 3.5  CL 117* 116* 117* 119* 117*  CO2 18* 19* 19* 18* 18*  GLUCOSE 146* 136* 138* 141* 137*  BUN 15 12 9 8 6   CREATININE 1.09* 1.00 0.93 0.89 0.95  CALCIUM 8.9 9.3 9.2 9.0 8.9   GFR: Estimated Creatinine Clearance: 38.9 mL/min (by C-G formula based on SCr of 0.95 mg/dL). Liver Function Tests: No results for input(s): AST, ALT, ALKPHOS, BILITOT, PROT, ALBUMIN in the last 168 hours. No results for input(s): LIPASE, AMYLASE in the last 168 hours. No results for input(s): AMMONIA in the last 168 hours. Coagulation Profile: No results for input(s): INR, PROTIME in the last 168 hours. Cardiac Enzymes: No results for input(s): CKTOTAL, CKMB, CKMBINDEX, TROPONINI in the last 168 hours. BNP (last 3 results) No results for input(s): PROBNP in the last 8760 hours. HbA1C: No results for input(s): HGBA1C in the last 72 hours. CBG: No results for input(s): GLUCAP in the last 168 hours. Lipid Profile: No results for input(s): CHOL, HDL, LDLCALC, TRIG, CHOLHDL, LDLDIRECT in the last 72 hours. Thyroid Function Tests: No results for input(s): TSH, T4TOTAL, FREET4, T3FREE, THYROIDAB in the last 72 hours. Anemia Panel: No results for input(s): VITAMINB12, FOLATE, FERRITIN, TIBC, IRON, RETICCTPCT in the last 72 hours. Urine analysis:    Component Value Date/Time   COLORURINE YELLOW 06/28/2017 1213   APPEARANCEUR TURBID (A) 06/28/2017 1213   LABSPEC 1.011 06/28/2017 1213   PHURINE 5.0 06/28/2017 1213   GLUCOSEU NEGATIVE 06/28/2017 1213   HGBUR MODERATE (A) 06/28/2017 1213   BILIRUBINUR NEGATIVE 06/28/2017 1213    KETONESUR NEGATIVE 06/28/2017 1213   PROTEINUR 100 (A) 06/28/2017 1213   UROBILINOGEN 0.2 03/09/2015 1352   NITRITE NEGATIVE 06/28/2017 1213   LEUKOCYTESUR MODERATE (A) 06/28/2017 1213   Sepsis Labs: @LABRCNTIP (procalcitonin:4,lacticidven:4)  ) Recent Results (from the past 240 hour(s))  MRSA PCR Screening     Status: None   Collection Time: 09/15/17 10:10 PM  Result Value Ref Range Status   MRSA by PCR NEGATIVE NEGATIVE Final    Comment:        The GeneXpert MRSA Assay (FDA approved for NASAL specimens only), is one component of a comprehensive MRSA colonization surveillance program. It is not intended to diagnose MRSA infection nor to guide or monitor treatment for MRSA infections. Performed at [facility], [place] [address] [place], [place] [address]       Studies: Dg Abd Portable 2v  Result Date: 09/24/2017 CLINICAL DATA:  Abdominal distension EXAM: PORTABLE ABDOMEN - 2 VIEW COMPARISON:  Portable exam 0513 hours compared to 09/20/2017 FINDINGS: Again identified gaseous distention of the sigmoid colon consistent with sigmoid volvulus, little changed. Sigmoid colon measures up to 11.2 cm transverse. Air-filled  nondistended proximal colon than small bowel. Question minimal bowel wall thickening of the sigmoid colon. Bones demineralized. IMPRESSION: Persistent visualization of sigmoid volvulus. Electronically Signed   By: Lavonia Dana M.D.   On: 09/24/2017 08:05    Scheduled Meds: . amLODipine  10 mg Oral Daily  . baclofen  5 mg Oral QHS  . calcium carbonate  2 tablet Oral TID  . diclofenac sodium  2 g Topical BID  . dorzolamide  1 drop Both Eyes TID  . gabapentin  100 mg Oral QHS  . latanoprost  1 drop Both Eyes QHS  . lidocaine  15 mL Mouth/Throat Once  . nystatin  1 g Topical BID  . polyethylene glycol  17 g Oral Daily  . potassium chloride SA  20 mEq Oral QHS  . potassium chloride  40 mEq Oral Daily    Continuous Infusions: . famotidine  (PEPCID) IV Stopped (09/23/17 2203)     LOS: 8 days     Alma Friendly, MD [facility]   If 7PM-7AM, please contact night-coverage [email or web address] Password [facility] 09/24/2017, 3:48 PM

## 2017-09-24 NOTE — Progress Notes (Signed)
Palliative:  Ms. Koska is sleeping and no family at bedside. Son in route from [place] today but not arrived yet. I have been called back to [facility] for another patient and will follow up with patient and son/HCPOA in the morning to further discuss options and goals.   No charge  [name], NP Palliative Medicine Team Pager # [phone] (M-F 8a-5p) Team Phone # [phone] (Nights/Weekends)

## 2017-09-25 LAB — CBC WITH DIFFERENTIAL/PLATELET
Basophils Absolute: 0 10*3/uL (ref 0.0–0.1)
Basophils Relative: 1 %
EOS ABS: 0.3 10*3/uL (ref 0.0–0.7)
EOS PCT: 9 %
HCT: 31.1 % — ABNORMAL LOW (ref 36.0–46.0)
Hemoglobin: 9.9 g/dL — ABNORMAL LOW (ref 12.0–15.0)
LYMPHS ABS: 0.9 10*3/uL (ref 0.7–4.0)
LYMPHS PCT: 26 %
MCH: 33.6 pg (ref 26.0–34.0)
MCHC: 31.8 g/dL (ref 30.0–36.0)
MCV: 105.4 fL — AB (ref 78.0–100.0)
MONO ABS: 0.4 10*3/uL (ref 0.1–1.0)
Monocytes Relative: 10 %
Neutro Abs: 2 10*3/uL (ref 1.7–7.7)
Neutrophils Relative %: 54 %
PLATELETS: 150 10*3/uL (ref 150–400)
RBC: 2.95 MIL/uL — AB (ref 3.87–5.11)
RDW: 15.2 % (ref 11.5–15.5)
WBC: 3.6 10*3/uL — AB (ref 4.0–10.5)

## 2017-09-25 MED ORDER — LIDOCAINE 5 % EX PTCH
1.0000 | MEDICATED_PATCH | CUTANEOUS | Status: DC
  Administered 2017-09-25 – 2017-09-26 (×2): 1 via TRANSDERMAL
  Filled 2017-09-25 (×3): qty 1

## 2017-09-25 NOTE — Care Management Important Message (Signed)
Important Message  Patient Details  Name: GREGORY DOWE MRN: [id number] Date of Birth: 06/30/1924   Medicare Important Message Given:  Yes    Kerin Salen 09/25/2017, 1:29 [place] Message  Patient Details  Name: SHAMMARA JARRETT MRN: [id number] Date of Birth: 03-17-1925   Medicare Important Message Given:  Yes    Kerin Salen 09/25/2017, 1:29 PM

## 2017-09-25 NOTE — Progress Notes (Addendum)
PROGRESS NOTE  Maria Christian:[id number] DOB: [birth year] DOA: 09/15/2017 PCP: [name], DO  HPI/Recap of past 24 hours: Maria Christian is an 84 y.o. female with past medical history of HTN,  A. fib chronic diastolic heart failure, recurrent sigmoid volvulus with 5 episodes since 2014 (patient's family refusing surgical intervention), who presents with abdominal pain, N/V for 3 days prior to admission. Pt was found to have sigmoid volvulus. Pt admitted for further management.  Today, patient denies any worsening abdominal pain, abd xray with persistent sigmoid volvulus, with abdomen more distended. No nausea/vomiting, fever/chills.   Assessment/Plan: Principal Problem:   Sigmoid volvulus (HCC) Active Problems:   Thrombocytopenia (HCC)   Dementia with behavioral disturbance   Normocytic anemia   Chronic atrial fibrillation (HCC)   Acute kidney injury superimposed on chronic kidney disease (HCC)   GERD (gastroesophageal reflux disease)  Recurrent sigmoid volvulus S/P decompression X 2 by GI on 4/15 and 4/16 GI on board: Last colonoscopy done on 4/16 showed an area of ulceration in the colon, representing possible ischemia.  Biopsies showed colonic ulcer, no concern for malignancy Repeat abd x-ray on 4/22 showed persistent recurrence of sigmoid volvulus GI signed off  Advance diet to clears, re-start MiraLAX Palliative team spoke to family including son who is the Ward on 4/23, and they now all agreed to move forward with surgery after they have been declining earlier Reconsulted Gen surg: Spoke with Dr Lucia Gaskins and Dr Hassell Done, will see pt in the am. Family requesting Dr Excell Seltzer (not available this week) Will place NPO at midnight   Acute kidney injury on chronic disease stage III Resolved Likely prerenal in etiology Baseline creatinine around 1 on admission 2.1 Daily BMP  Hypokalemia Replace prn  ?Chronic respiratory failure with hypoxia Currently on room air, sat  well On oxygen at nursing home, monitor closely  Chronic headache/?? Migraine Ongoing for years, worsening over the past couple of months as per daughter CT scan of the head on 2018 showed no acute abnormalities Patient has an appointment with neurology outpatient on Oct 10, 2017, daughter  insisting patient has MRI prior to the visit Continue Excedrin Migraine  Chronic atrial fibrillation Rate controlled  No anticoagulation likely due to risk of bleeding/age  Macrocytic anemia: Hgb at baseline Vit B 12 on 9/18 685, folate WNL   Chronic diastolic heart failure Appears to be euvolemic Hold Lasix for now  Chronic thrombocytopenia Appears to be stable  Dementia with behavioral disturbance Seroquel at night, IV Haldol as needed    Code Status: DNR  Family Communication: None at bedside  Disposition Plan: Possibly back to SNF, palliative consulted   Consultants:  GI  Gen surg  Palliative team  Procedures:  Colonoscopy on 09/18/17  Antimicrobials:  None  DVT prophylaxis: SCDs   Objective: Vitals:   09/24/17 0518 09/24/17 1634 09/24/17 2119 09/25/17 0542  BP: (!) 144/67 135/63 134/72 129/82  Pulse: 71 (!) 59 73 65  Resp: 18 20 18 17   Temp: 98.3 F (36.8 C) 97.7 F (36.5 C) 98.2 F (36.8 C) 98.1 F (36.7 C)  TempSrc: Oral Oral Oral Axillary  SpO2: 97%  97% 99%  Weight:      Height:        Intake/Output Summary (Last 24 hours) at 09/25/2017 1457 Last data filed at 09/25/2017 1049 Gross per 24 hour  Intake 240 ml  Output 450 ml  Net -210 ml   Filed Weights   09/15/17 2200 09/16/17 1105  Weight:  71 kg (156 lb 8.4 oz) 71 kg (156 lb 8.4 oz)    Exam:   General: NAD  Cardiovascular: S1, S2 present  Respiratory: CTAB  Abdomen: Soft, nontender, mildly distended, hyperactive BS around umbilicus  Musculoskeletal: RLE with pedal edema, none on the left  Skin: Normal  Psychiatry: Normal mood   Data Reviewed: CBC: Recent Labs  Lab  09/21/17 0447 09/22/17 0327 09/23/17 0417 09/24/17 0506 09/25/17 0428  WBC 4.8 4.5 4.3 3.7* 3.6*  NEUTROABS 2.5 2.4 2.3 2.0 2.0  HGB 9.7* 9.7* 9.9* 9.5* 9.9*  HCT 29.0* 29.7* 30.2* 29.4* 31.1*  MCV 102.8* 103.5* 104.9* 105.4* 105.4*  PLT 113* 125* 143* 136* 284   Basic Metabolic Panel: Recent Labs  Lab 09/20/17 0350 09/21/17 0447 09/22/17 0327 09/23/17 0417 09/24/17 0506  NA 141 142 144 145 144  K 3.9 4.0 3.7 3.4* 3.5  CL 117* 116* 117* 119* 117*  CO2 18* 19* 19* 18* 18*  GLUCOSE 146* 136* 138* 141* 137*  BUN 15 12 9 8 6   CREATININE 1.09* 1.00 0.93 0.89 0.95  CALCIUM 8.9 9.3 9.2 9.0 8.9   GFR: Estimated Creatinine Clearance: 38.9 mL/min (by C-G formula based on SCr of 0.95 mg/dL). Liver Function Tests: No results for input(s): AST, ALT, ALKPHOS, BILITOT, PROT, ALBUMIN in the last 168 hours. No results for input(s): LIPASE, AMYLASE in the last 168 hours. No results for input(s): AMMONIA in the last 168 hours. Coagulation Profile: No results for input(s): INR, PROTIME in the last 168 hours. Cardiac Enzymes: No results for input(s): CKTOTAL, CKMB, CKMBINDEX, TROPONINI in the last 168 hours. BNP (last 3 results) No results for input(s): PROBNP in the last 8760 hours. HbA1C: No results for input(s): HGBA1C in the last 72 hours. CBG: No results for input(s): GLUCAP in the last 168 hours. Lipid Profile: No results for input(s): CHOL, HDL, LDLCALC, TRIG, CHOLHDL, LDLDIRECT in the last 72 hours. Thyroid Function Tests: No results for input(s): TSH, T4TOTAL, FREET4, T3FREE, THYROIDAB in the last 72 hours. Anemia Panel: No results for input(s): VITAMINB12, FOLATE, FERRITIN, TIBC, IRON, RETICCTPCT in the last 72 hours. Urine analysis:    Component Value Date/Time   COLORURINE YELLOW 06/28/2017 1213   APPEARANCEUR TURBID (A) 06/28/2017 1213   LABSPEC 1.011 06/28/2017 1213   PHURINE 5.0 06/28/2017 1213   GLUCOSEU NEGATIVE 06/28/2017 1213   HGBUR MODERATE (A) 06/28/2017  1213   BILIRUBINUR NEGATIVE 06/28/2017 1213   KETONESUR NEGATIVE 06/28/2017 1213   PROTEINUR 100 (A) 06/28/2017 1213   UROBILINOGEN 0.2 03/09/2015 1352   NITRITE NEGATIVE 06/28/2017 1213   LEUKOCYTESUR MODERATE (A) 06/28/2017 1213   Sepsis Labs: @LABRCNTIP (procalcitonin:4,lacticidven:4)  ) Recent Results (from the past 240 hour(s))  MRSA PCR Screening     Status: None   Collection Time: 09/15/17 10:10 PM  Result Value Ref Range Status   MRSA by PCR NEGATIVE NEGATIVE Final    Comment:        The GeneXpert MRSA Assay (FDA approved for NASAL specimens only), is one component of a comprehensive MRSA colonization surveillance program. It is not intended to diagnose MRSA infection nor to guide or monitor treatment for MRSA infections. Performed at [facility], [place] [address], [place], [place] [address]       Studies: No results found.  Scheduled Meds: . amLODipine  10 mg Oral Daily  . baclofen  5 mg Oral QHS  . calcium carbonate  2 tablet Oral TID  . diclofenac sodium  2 g Topical BID  .  dorzolamide  1 drop Both Eyes TID  . gabapentin  100 mg Oral QHS  . latanoprost  1 drop Both Eyes QHS  . lidocaine  15 mL Mouth/Throat Once  . nystatin  1 g Topical BID  . polyethylene glycol  17 g Oral Daily  . potassium chloride SA  20 mEq Oral QHS  . potassium chloride  40 mEq Oral Daily    Continuous Infusions: . famotidine (PEPCID) IV Stopped (09/24/17 2345)     LOS: 9 days     Alma Friendly, MD [facility]   If 7PM-7AM, please contact night-coverage [email or web address] Password [facility] 09/25/2017, 2:57 PM

## 2017-09-25 NOTE — Progress Notes (Addendum)
Palliative:  I met today at Ms. Lento's bedside with 2 children: Patrick Jupiter, and [name]. They share with me that they would like to pursue surgery at this time and have requested Dr. Excell Seltzer. I told them that I would pass that along but the particular surgeon and the timing of surgery is out of my control. They are very focused on getting surgery back involved and setting a time so they can schedule their time to be here.   I attempted to elicit conversation regarding her frailty and complications of surgery and recovery that could occur. They were very distracted throughout my conversation with their own discussions at times. We did elicit that Ms. Rylee would not want to be left on a ventilator for long if she has trouble weaning. They do share that their ultimate priority is her comfort. They wish to pursue surgery with hopes that she can improve and be able to enjoy food again.   Paged Dr. Horris Latino to make aware of patient and family's decision to pursue surgery now and she is contacting surgery to reconsult.   39 min  [name], NP Palliative Medicine Team Pager # [phone] (M-F 8a-5p) Team Phone # [phone] (Nights/Weekends)

## 2017-09-26 LAB — BASIC METABOLIC PANEL
ANION GAP: 7 (ref 5–15)
BUN: 6 mg/dL (ref 6–20)
CALCIUM: 9.1 mg/dL (ref 8.9–10.3)
CO2: 18 mmol/L — AB (ref 22–32)
Chloride: 119 mmol/L — ABNORMAL HIGH (ref 101–111)
Creatinine, Ser: 1.01 mg/dL — ABNORMAL HIGH (ref 0.44–1.00)
GFR calc Af Amer: 55 mL/min — ABNORMAL LOW (ref >60)
GFR calc non Af Amer: 48 mL/min — ABNORMAL LOW (ref >60)
GLUCOSE: 124 mg/dL — AB (ref 65–99)
Potassium: 3.5 mmol/L (ref 3.5–5.1)
Sodium: 144 mmol/L (ref 135–145)

## 2017-09-26 LAB — CBC WITH DIFFERENTIAL/PLATELET
BASOS ABS: 0 10*3/uL (ref 0.0–0.1)
Basophils Relative: 1 %
EOS PCT: 7 %
Eosinophils Absolute: 0.2 10*3/uL (ref 0.0–0.7)
HCT: 30.6 % — ABNORMAL LOW (ref 36.0–46.0)
Hemoglobin: 9.6 g/dL — ABNORMAL LOW (ref 12.0–15.0)
LYMPHS ABS: 1 10*3/uL (ref 0.7–4.0)
LYMPHS PCT: 31 %
MCH: 33 pg (ref 26.0–34.0)
MCHC: 31.4 g/dL (ref 30.0–36.0)
MCV: 105.2 fL — AB (ref 78.0–100.0)
MONO ABS: 0.4 10*3/uL (ref 0.1–1.0)
Monocytes Relative: 13 %
Neutro Abs: 1.6 10*3/uL — ABNORMAL LOW (ref 1.7–7.7)
Neutrophils Relative %: 48 %
Platelets: 152 10*3/uL (ref 150–400)
RBC: 2.91 MIL/uL — ABNORMAL LOW (ref 3.87–5.11)
RDW: 15.1 % (ref 11.5–15.5)
WBC: 3.3 10*3/uL — ABNORMAL LOW (ref 4.0–10.5)

## 2017-09-26 LAB — PREALBUMIN: Prealbumin: 9.7 mg/dL — ABNORMAL LOW (ref 18–38)

## 2017-09-26 MED ORDER — POLYETHYLENE GLYCOL 3350 17 GM/SCOOP PO POWD
0.5000 | Freq: Once | ORAL | Status: AC
  Administered 2017-09-26: 127.5 g via ORAL
  Filled 2017-09-26: qty 255

## 2017-09-26 MED ORDER — POTASSIUM CHLORIDE 20 MEQ PO PACK
40.0000 meq | PACK | Freq: Once | ORAL | Status: AC
  Administered 2017-09-26: 40 meq via ORAL
  Filled 2017-09-26: qty 2

## 2017-09-26 MED ORDER — FAMOTIDINE 20 MG PO TABS
20.0000 mg | ORAL_TABLET | Freq: Every day | ORAL | Status: DC
  Administered 2017-09-26: 20 mg via ORAL
  Filled 2017-09-26: qty 1

## 2017-09-26 MED ORDER — ADULT MULTIVITAMIN W/MINERALS CH
1.0000 | ORAL_TABLET | Freq: Every day | ORAL | Status: DC
  Administered 2017-09-26: 1 via ORAL
  Filled 2017-09-26: qty 1

## 2017-09-26 MED ORDER — BOOST / RESOURCE BREEZE PO LIQD CUSTOM
1.0000 | Freq: Three times a day (TID) | ORAL | Status: DC
  Administered 2017-09-26 (×3): 1 via ORAL

## 2017-09-26 NOTE — Progress Notes (Addendum)
Palliative:  I met again this morning with Ms. Scarfone and son [name]. [name] is worried that his mother is more sleepy today. Discussed limited nutrition as well as poor sleep and that this is not surprising that she is very sleepy after all she has been through. Reassured him that this is not medication related.   They are preparing for surgery to prevent recurrent sigmoid volvulus hopefully in the next 1-2 days (not today per notes). Added multivitamin per Mike's request and ordered Breeze for nutritional support on clear liquid diet. No real questions/concerns at this point but only waiting to find out when surgery is planned to occur.   Ms. Golda denies pain, distress, nausea. Also says she is not hungry. Slept better last night. Emotional support provided.   20 min  [name], NP Palliative Medicine Team Pager # [phone] (M-F 8a-5p) Team Phone # [phone] (Nights/Weekends)

## 2017-09-26 NOTE — Progress Notes (Signed)
PT Cancellation Note  Patient Details Name: Maria Christian MRN: [id number] DOB: 1924/09/07   Cancelled Treatment:    Reason Eval/Treat Not Completed: Medical issues which prohibited therapy Note that patient and family will pursue surgery. Will check back after surgery.   Claretha Cooper 09/26/2017, 7:48 AM Tresa Endo PT [phone]

## 2017-09-26 NOTE — Progress Notes (Signed)
[facility] Progress Note  Patient: Maria Christian LGX:[id number]   PCP: [name], DO DOB: 12-19-1924   DOA: 09/15/2017   DOS: 09/26/2017   Date of Service: the patient was seen and examined on 09/26/2017  Subjective: Feeling better, no nausea no vomiting no abdominal pain.  Passing gas actually had a bowel movement as well.  Brief hospital course: Pt. with PMH of HTN, A. fib, chronic diastolic CHF, recurrent sigmoid volvulus; admitted on 09/15/2017, presented with complaint of abdominal pain, was found to have sigmoid volvulus.  Initially patient refused surgical correction and GI was consulted.  Underwent decompression x2 with GI without any significant improvement and therefore palliative care was consulted.  After discussion with son and palliative care patient decided to proceed with surgical correction. Currently further plan is awaiting surgical recommendation  Assessment and Plan: 1. recurrent sigmoid volvulus. S/P decompression x2 by GI. On 09/17/2017 and 09/18/2017. Appreciate GI assistance.  Also on colonoscopy there are some areas of ulceration in the colon concerning for ischemia no malignancy. Repeat x-ray on 422 shows persistent sigmoid volvulus. Currently on clear liquid diet as well as MiraLAX. Patient and family considering surgery, per note they will decide on the timing of the surgery but it will not happen on 09/26/2017.  Advance back to clear liquid diet.  2.  Acute kidney injury. Prerenal etiology. Baseline serum creatinine around 1, on admission 2.1.  Getting better with hydration.  Monitor.  3.  Hypokalemia. 60 mg of potassium daily. Patient having difficulty swallowing pills, will change to packet..  4. ?Chronic respiratory failure with hypoxia Currently on room air, sat well On oxygen at nursing home, monitor closely  5. Chronic headache/?? Migraine Ongoing for years, worsening over the past couple of months as per daughter CT scan of the head on  2018 showed no acute abnormalities Patient has an appointment with neurology outpatient on Oct 10, 2017, daughter  insisting patient has MRI prior to the visit currently not indicated. Continue Excedrin Migraine  6. Chronic atrial fibrillation Rate controlled  No anticoagulation likely due to risk of bleeding/age  84. Macrocytic anemia: Hgb at baseline Vit B 12 on 9/18 685, folate WNL   8. Chronic diastolic heart failure Appears to beeuvolemic Hold Lasix for now  9. Chronic thrombocytopenia Appears to be stable  10. Dementia with behavioral disturbance Seroquel at night, IV Haldol as needed  Diet: clear liquid diet DVT Prophylaxis: subcutaneous Heparin  Advance goals of care discussion: DNR DNI  Family Communication: family was present at bedside, at the time of interview. The pt provided permission to discuss medical plan with the family. Opportunity was given to ask question and all questions were answered satisfactorily.   Disposition:  Discharge to be determined.  Consultants: gastroenterology General surgery  Palliative care  Procedures: Colonoscopy with decompression, 09/17/2017, 09/18/2017.  Antibiotics: Anti-infectives (From admission, onward)   None       Objective: Physical Exam: Vitals:   09/25/17 2153 09/26/17 0523 09/26/17 1018 09/26/17 1018  BP: (!) 145/74 (!) 137/58 (!) 149/69 (!) 149/69  Pulse: 68 (!) 56  79  Resp: 16 14    Temp: 98 F (36.7 C) 98 F (36.7 C)    TempSrc: Oral Oral    SpO2: 98% 97%    Weight:      Height:        Intake/Output Summary (Last 24 hours) at 09/26/2017 1422 Last data filed at 09/26/2017 0527 Gross per 24 hour  Intake -  Output 620  ml  Net -620 ml   Filed Weights   09/15/17 2200 09/16/17 1105  Weight: 71 kg (156 lb 8.4 oz) 71 kg (156 lb 8.4 oz)   General: Alert, Awake and Oriented to Time, Place and Person. Appear in mild distress, affect appropriate Eyes: PERRL, Conjunctiva normal ENT: Oral Mucosa clear  moist. Neck: no JVD, no Abnormal Mass Or lumps Cardiovascular: S1 and S2 Present, no Murmur, Peripheral Pulses Present Respiratory: normal respiratory effort, Bilateral Air entry equal and Decreased, no use of accessory muscle, Clear to Auscultation, no Crackles, no wheezes Abdomen: Bowel Sound present, Soft and mild tenderness, no hernia Skin: no redness, no Rash, no induration Extremities: no Pedal edema, no calf tenderness Neurologic: Grossly no focal neuro deficit. Bilaterally Equal motor strength  Data Reviewed: CBC: Recent Labs  Lab 09/22/17 0327 09/23/17 0417 09/24/17 0506 09/25/17 0428 09/26/17 0354  WBC 4.5 4.3 3.7* 3.6* 3.3*  NEUTROABS 2.4 2.3 2.0 2.0 1.6*  HGB 9.7* 9.9* 9.5* 9.9* 9.6*  HCT 29.7* 30.2* 29.4* 31.1* 30.6*  MCV 103.5* 104.9* 105.4* 105.4* 105.2*  PLT 125* 143* 136* 150 578   Basic Metabolic Panel: Recent Labs  Lab 09/21/17 0447 09/22/17 0327 09/23/17 0417 09/24/17 0506 09/26/17 0354  NA 142 144 145 144 144  K 4.0 3.7 3.4* 3.5 3.5  CL 116* 117* 119* 117* 119*  CO2 19* 19* 18* 18* 18*  GLUCOSE 136* 138* 141* 137* 124*  BUN 12 9 8 6 6   CREATININE 1.00 0.93 0.89 0.95 1.01*  CALCIUM 9.3 9.2 9.0 8.9 9.1    Liver Function Tests: No results for input(s): AST, ALT, ALKPHOS, BILITOT, PROT, ALBUMIN in the last 168 hours. No results for input(s): LIPASE, AMYLASE in the last 168 hours. No results for input(s): AMMONIA in the last 168 hours. Coagulation Profile: No results for input(s): INR, PROTIME in the last 168 hours. Cardiac Enzymes: No results for input(s): CKTOTAL, CKMB, CKMBINDEX, TROPONINI in the last 168 hours. BNP (last 3 results) No results for input(s): PROBNP in the last 8760 hours. CBG: No results for input(s): GLUCAP in the last 168 hours. Studies: No results found.  Scheduled Meds: . amLODipine  10 mg Oral Daily  . baclofen  5 mg Oral QHS  . diclofenac sodium  2 g Topical BID  . dorzolamide  1 drop Both Eyes TID  . famotidine   20 mg Oral Daily  . feeding supplement  1 Container Oral TID BM  . gabapentin  100 mg Oral QHS  . latanoprost  1 drop Both Eyes QHS  . lidocaine  1 patch Transdermal Q24H  . multivitamin with minerals  1 tablet Oral Daily  . nystatin  1 g Topical BID  . polyethylene glycol  17 g Oral Daily  . polyethylene glycol powder  0.5 Container Oral Once  . potassium chloride  40 mEq Oral Once  . potassium chloride SA  20 mEq Oral QHS  . potassium chloride  40 mEq Oral Daily   Continuous Infusions: PRN Meds: acetaminophen **OR** acetaminophen, aspirin-acetaminophen-caffeine, benzocaine-menthol, haloperidol lactate, ipratropium-albuterol, ondansetron **OR** ondansetron (ZOFRAN) IV, phenol, QUEtiapine, sodium chloride  Time spent: 35 minutes  Author: Berle Mull, MD [facility] Pager: [phone] 09/26/2017 2:22 PM  If 7PM-7AM, please contact night-coverage at [email or web address], password [facility]

## 2017-09-26 NOTE — Progress Notes (Addendum)
  [place] Progress Note  8 Days Post-Op  Subjective: CC- tired Patient states that she is tired this morning but slept well last night. Denies any abdominal pain but she does feel bloated. States that she had 2 BMs yesterday.  Son, [name], at bedside asked to step outside to talk abut surgery.  Objective: Vital signs in last 24 hours: Temp:  [98 F (36.7 C)-98.6 F (37 C)] 98 F (36.7 C) (04/24 0523) Pulse Rate:  [56-79] 56 (04/24 0523) Resp:  [14-17] 14 (04/24 0523) BP: (137-145)/(58-74) 137/58 (04/24 0523) SpO2:  [97 %-99 %] 97 % (04/24 0523) Last BM Date: 09/24/17  Intake/Output from previous day: 04/23 0701 - 04/24 0700 In: 490 [P.O.:490] Out: 620 [Urine:620] Intake/Output this shift: No intake/output data recorded.  PE: Gen:  Alert, NAD, pleasant HEENT: EOM's intact, pupils equal and round Card:  Irregularly irregular  Pulm:  CTAB, no W/R/R, effort normal Abd: Soft, distended, high pitched BS, no mass or hernia, nontender Ext:  Calves soft and nontender, no LE edema Psych: Alert and oriented only to self Skin: no rashes noted, warm and dry  Lab Results:  Recent Labs    09/25/17 0428 09/26/17 0354  WBC 3.6* 3.3*  HGB 9.9* 9.6*  HCT 31.1* 30.6*  PLT 150 152   BMET Recent Labs    09/24/17 0506 09/26/17 0354  NA 144 144  K 3.5 3.5  CL 117* 119*  CO2 18* 18*  GLUCOSE 137* 124*  BUN 6 6  CREATININE 0.95 1.01*  CALCIUM 8.9 9.1   PT/INR No results for input(s): LABPROT, INR in the last 72 hours. CMP     Component Value Date/Time   NA 144 09/26/2017 0354   NA 144 07/10/2017   K 3.5 09/26/2017 0354   CL 119 (H) 09/26/2017 0354   CO2 18 (L) 09/26/2017 0354   GLUCOSE 124 (H) 09/26/2017 0354   BUN 6 09/26/2017 0354   BUN 11 07/10/2017   CREATININE 1.01 (H) 09/26/2017 0354   CALCIUM 9.1 09/26/2017 0354   PROT 8.1 09/15/2017 1650   ALBUMIN 3.9 09/15/2017 1650   AST 19 09/15/2017 1650   ALT 12 (L) 09/15/2017 1650   ALKPHOS 99  09/15/2017 1650   BILITOT 0.8 09/15/2017 1650   GFRNONAA 48 (L) 09/26/2017 0354   GFRAA 55 (L) 09/26/2017 0354   Lipase     Component Value Date/Time   LIPASE 24 05/09/2017 1705       Studies/Results: No results found.  Anti-infectives: Anti-infectives (From admission, onward)   None       Assessment/Plan Atrial fibrillation - not on anticoagulants Anemia Dementia CKD DNR  Recurrent volvulus  - she has had 5 flexible sigmoidoscopies since 2014, and 2 this admission on 4/15 and 4/16 - xray 4/22 shows persistent sigmoid volvulus  ID - currently none FEN - IVF, NPO VTE - SCDs Foley - wick  Plan - Spoke with son, [name], who states that Ms. Gudiel has decided that she wants to proceed with surgery despite the risks. He will not let me discuss this with her, and only wants to talk outside of the room. Surgery will not be today, but will discuss timing with MD. Gentle bowel prep ordered.   LOS: 10 days    [name] , [facility] Surgery 09/26/2017, 9:01 AM Pager: [phone] Consults: [phone] Mon-Fri 7:00 am-4:30 pm Sat-Sun 7:00 am-11:30 am

## 2017-09-26 NOTE — H&P (View-Only) (Signed)
  [place] [facility] Progress Note  8 Days Post-Op  Subjective: CC- tired Patient states that she is tired this morning but slept well last night. Denies any abdominal pain but she does feel bloated. States that she had 2 BMs yesterday.  Son, [name], at bedside asked to step outside to talk abut surgery.  Objective: Vital signs in last 24 hours: Temp:  [98 F (36.7 C)-98.6 F (37 C)] 98 F (36.7 C) (04/24 0523) Pulse Rate:  [56-79] 56 (04/24 0523) Resp:  [14-17] 14 (04/24 0523) BP: (137-145)/(58-74) 137/58 (04/24 0523) SpO2:  [97 %-99 %] 97 % (04/24 0523) Last BM Date: 09/24/17  Intake/Output from previous day: 04/23 0701 - 04/24 0700 In: 490 [P.O.:490] Out: 620 [Urine:620] Intake/Output this shift: No intake/output data recorded.  PE: Gen:  Alert, NAD, pleasant HEENT: EOM's intact, pupils equal and round Card:  Irregularly irregular  Pulm:  CTAB, no W/R/R, effort normal Abd: Soft, distended, high pitched BS, no mass or hernia, nontender Ext:  Calves soft and nontender, no LE edema Psych: Alert and oriented only to self Skin: no rashes noted, warm and dry  Lab Results:  Recent Labs    09/25/17 0428 09/26/17 0354  WBC 3.6* 3.3*  HGB 9.9* 9.6*  HCT 31.1* 30.6*  PLT 150 152   BMET Recent Labs    09/24/17 0506 09/26/17 0354  NA 144 144  K 3.5 3.5  CL 117* 119*  CO2 18* 18*  GLUCOSE 137* 124*  BUN 6 6  CREATININE 0.95 1.01*  CALCIUM 8.9 9.1   PT/INR No results for input(s): LABPROT, INR in the last 72 hours. CMP     Component Value Date/Time   NA 144 09/26/2017 0354   NA 144 07/10/2017   K 3.5 09/26/2017 0354   CL 119 (H) 09/26/2017 0354   CO2 18 (L) 09/26/2017 0354   GLUCOSE 124 (H) 09/26/2017 0354   BUN 6 09/26/2017 0354   BUN 11 07/10/2017   CREATININE 1.01 (H) 09/26/2017 0354   CALCIUM 9.1 09/26/2017 0354   PROT 8.1 09/15/2017 1650   ALBUMIN 3.9 09/15/2017 1650   AST 19 09/15/2017 1650   ALT 12 (L) 09/15/2017 1650   ALKPHOS 99  09/15/2017 1650   BILITOT 0.8 09/15/2017 1650   GFRNONAA 48 (L) 09/26/2017 0354   GFRAA 55 (L) 09/26/2017 0354   Lipase     Component Value Date/Time   LIPASE 24 05/09/2017 1705       Studies/Results: No results found.  Anti-infectives: Anti-infectives (From admission, onward)   None       Assessment/Plan Atrial fibrillation - not on anticoagulants Anemia Dementia CKD DNR  Recurrent volvulus  - she has had 5 flexible sigmoidoscopies since 2014, and 2 this admission on 4/15 and 4/16 - xray 4/22 shows persistent sigmoid volvulus  ID - currently none FEN - IVF, NPO VTE - SCDs Foley - wick  Plan - Spoke with son, [name], who states that Maria Christian has decided that she wants to proceed with surgery despite the risks. He will not let me discuss this with her, and only wants to talk outside of the room. Surgery will not be today, but will discuss timing with MD. Gentle bowel prep ordered.   LOS: 10 days    [name] , [facility] Surgery 09/26/2017, 9:01 AM Pager: [phone] Consults: [phone] Mon-Fri 7:00 am-4:30 pm Sat-Sun 7:00 am-11:30 am

## 2017-09-27 ENCOUNTER — Encounter (HOSPITAL_COMMUNITY): Payer: Self-pay | Admitting: Certified Registered"

## 2017-09-27 ENCOUNTER — Inpatient Hospital Stay (HOSPITAL_COMMUNITY): Payer: Medicare Other

## 2017-09-27 ENCOUNTER — Encounter (HOSPITAL_COMMUNITY)
Admission: EM | Disposition: A | Discharge status: Skilled Nursing Facility | Payer: Self-pay | Source: Home / Self Care | Attending: Internal Medicine

## 2017-09-27 ENCOUNTER — Inpatient Hospital Stay (HOSPITAL_COMMUNITY): Payer: Medicare Other | Admitting: Certified Registered Nurse Anesthetist

## 2017-09-27 DIAGNOSIS — R609 Edema, unspecified: Secondary | ICD-10-CM

## 2017-09-27 DIAGNOSIS — E43 Unspecified severe protein-calorie malnutrition: Secondary | ICD-10-CM

## 2017-09-27 HISTORY — PX: LAPAROSCOPIC SIGMOID COLECTOMY: SHX5928

## 2017-09-27 LAB — CBC WITH DIFFERENTIAL/PLATELET
BASOS PCT: 0 %
Basophils Absolute: 0 10*3/uL (ref 0.0–0.1)
EOS ABS: 0.2 10*3/uL (ref 0.0–0.7)
EOS PCT: 6 %
HCT: 30.8 % — ABNORMAL LOW (ref 36.0–46.0)
Hemoglobin: 9.9 g/dL — ABNORMAL LOW (ref 12.0–15.0)
Lymphocytes Relative: 32 %
Lymphs Abs: 1.2 10*3/uL (ref 0.7–4.0)
MCH: 33.6 pg (ref 26.0–34.0)
MCHC: 32.1 g/dL (ref 30.0–36.0)
MCV: 104.4 fL — AB (ref 78.0–100.0)
MONO ABS: 0.5 10*3/uL (ref 0.1–1.0)
Monocytes Relative: 13 %
NEUTROS ABS: 1.9 10*3/uL (ref 1.7–7.7)
NEUTROS PCT: 49 %
PLATELETS: 148 10*3/uL — AB (ref 150–400)
RBC: 2.95 MIL/uL — ABNORMAL LOW (ref 3.87–5.11)
RDW: 15 % (ref 11.5–15.5)
WBC: 3.8 10*3/uL — ABNORMAL LOW (ref 4.0–10.5)

## 2017-09-27 LAB — TYPE AND SCREEN
ABO/RH(D): O POS
Antibody Screen: NEGATIVE

## 2017-09-27 LAB — BASIC METABOLIC PANEL
Anion gap: 6 (ref 5–15)
BUN: 6 mg/dL (ref 6–20)
CHLORIDE: 119 mmol/L — AB (ref 101–111)
CO2: 18 mmol/L — ABNORMAL LOW (ref 22–32)
CREATININE: 1.12 mg/dL — AB (ref 0.44–1.00)
Calcium: 8.9 mg/dL (ref 8.9–10.3)
GFR calc Af Amer: 49 mL/min — ABNORMAL LOW (ref >60)
GFR, EST NON AFRICAN AMERICAN: 42 mL/min — AB (ref >60)
GLUCOSE: 130 mg/dL — AB (ref 65–99)
Potassium: 3.5 mmol/L (ref 3.5–5.1)
Sodium: 143 mmol/L (ref 135–145)

## 2017-09-27 LAB — SURGICAL PCR SCREEN
MRSA, PCR: NEGATIVE
Staphylococcus aureus: POSITIVE — AB

## 2017-09-27 SURGERY — COLECTOMY, SIGMOID, LAPAROSCOPIC
Anesthesia: General | Site: Abdomen

## 2017-09-27 MED ORDER — SODIUM CHLORIDE 0.9 % IV SOLN
INTRAVENOUS | Status: DC | PRN
  Administered 2017-09-27: 2 g via INTRAVENOUS

## 2017-09-27 MED ORDER — ROCURONIUM BROMIDE 10 MG/ML (PF) SYRINGE
PREFILLED_SYRINGE | INTRAVENOUS | Status: DC | PRN
  Administered 2017-09-27: 60 mg via INTRAVENOUS
  Administered 2017-09-27: 10 mg via INTRAVENOUS

## 2017-09-27 MED ORDER — ONDANSETRON HCL 4 MG/2ML IJ SOLN
4.0000 mg | Freq: Four times a day (QID) | INTRAMUSCULAR | Status: DC | PRN
Start: 2017-09-27 — End: 2017-10-06

## 2017-09-27 MED ORDER — FAMOTIDINE IN NACL 20-0.9 MG/50ML-% IV SOLN
20.0000 mg | Freq: Two times a day (BID) | INTRAVENOUS | Status: DC
  Administered 2017-09-27 – 2017-10-01 (×9): 20 mg via INTRAVENOUS
  Filled 2017-09-27 (×9): qty 50

## 2017-09-27 MED ORDER — CHLORHEXIDINE GLUCONATE CLOTH 2 % EX PADS
6.0000 | MEDICATED_PAD | Freq: Once | CUTANEOUS | Status: AC
  Administered 2017-09-27: 6 via TOPICAL

## 2017-09-27 MED ORDER — SODIUM CHLORIDE 0.9 % IJ SOLN: INTRAMUSCULAR | Status: DC | PRN

## 2017-09-27 MED ORDER — DEXAMETHASONE SODIUM PHOSPHATE 10 MG/ML IJ SOLN
INTRAMUSCULAR | Status: AC
  Filled 2017-09-27: qty 2

## 2017-09-27 MED ORDER — KCL IN DEXTROSE-NACL 20-5-0.45 MEQ/L-%-% IV SOLN
INTRAVENOUS | Status: DC
  Administered 2017-09-27 – 2017-10-03 (×8): via INTRAVENOUS
  Filled 2017-09-27 (×12): qty 1000

## 2017-09-27 MED ORDER — 0.9 % SODIUM CHLORIDE (POUR BTL) OPTIME
TOPICAL | Status: DC | PRN
  Administered 2017-09-27: 1000 mL

## 2017-09-27 MED ORDER — PHENYLEPHRINE HCL 10 MG/ML IJ SOLN
INTRAMUSCULAR | Status: AC
  Filled 2017-09-27: qty 5

## 2017-09-27 MED ORDER — KETOROLAC TROMETHAMINE 30 MG/ML IJ SOLN
INTRAMUSCULAR | Status: AC
  Filled 2017-09-27: qty 1

## 2017-09-27 MED ORDER — ETOMIDATE 2 MG/ML IV SOLN
INTRAVENOUS | Status: AC
  Filled 2017-09-27: qty 10

## 2017-09-27 MED ORDER — METOCLOPRAMIDE HCL 5 MG/ML IJ SOLN: 10.0000 mg | Freq: Once | INTRAMUSCULAR | Status: DC | PRN

## 2017-09-27 MED ORDER — CHLORHEXIDINE GLUCONATE CLOTH 2 % EX PADS
6.0000 | MEDICATED_PAD | Freq: Once | CUTANEOUS | Status: DC
  Administered 2017-09-27: 6 via TOPICAL

## 2017-09-27 MED ORDER — FENTANYL CITRATE (PF) 100 MCG/2ML IJ SOLN: 25.0000 ug | INTRAMUSCULAR | Status: DC | PRN

## 2017-09-27 MED ORDER — MORPHINE SULFATE (PF) 2 MG/ML IV SOLN
1.0000 mg | INTRAVENOUS | Status: DC | PRN
  Administered 2017-09-27 (×2): 1 mg via INTRAVENOUS
  Filled 2017-09-27 (×2): qty 1

## 2017-09-27 MED ORDER — PROPOFOL 10 MG/ML IV BOLUS
INTRAVENOUS | Status: AC
  Filled 2017-09-27: qty 20

## 2017-09-27 MED ORDER — PHENYLEPHRINE HCL 10 MG/ML IJ SOLN
INTRAVENOUS | Status: DC | PRN
  Administered 2017-09-27: 25 ug/min via INTRAVENOUS

## 2017-09-27 MED ORDER — ROCURONIUM BROMIDE 10 MG/ML (PF) SYRINGE
PREFILLED_SYRINGE | INTRAVENOUS | Status: AC
  Filled 2017-09-27: qty 15

## 2017-09-27 MED ORDER — LACTATED RINGERS IV SOLN
INTRAVENOUS | Status: DC
  Administered 2017-09-27: 15:00:00 via INTRAVENOUS

## 2017-09-27 MED ORDER — SUGAMMADEX SODIUM 200 MG/2ML IV SOLN
INTRAVENOUS | Status: AC
  Filled 2017-09-27: qty 8

## 2017-09-27 MED ORDER — SODIUM CHLORIDE 0.9 % IV SOLN
2.0000 g | Freq: Two times a day (BID) | INTRAVENOUS | Status: AC
  Administered 2017-09-28: 2 g via INTRAVENOUS
  Filled 2017-09-27: qty 2

## 2017-09-27 MED ORDER — SODIUM CHLORIDE 0.9 % IJ SOLN
INTRAMUSCULAR | Status: AC
  Filled 2017-09-27: qty 50

## 2017-09-27 MED ORDER — LIDOCAINE 2% (20 MG/ML) 5 ML SYRINGE
INTRAMUSCULAR | Status: AC
  Filled 2017-09-27: qty 20

## 2017-09-27 MED ORDER — FENTANYL CITRATE (PF) 250 MCG/5ML IJ SOLN
INTRAMUSCULAR | Status: DC | PRN
  Administered 2017-09-27: 25 ug via INTRAVENOUS
  Administered 2017-09-27 (×2): 50 ug via INTRAVENOUS
  Administered 2017-09-27: 25 ug via INTRAVENOUS

## 2017-09-27 MED ORDER — BUPIVACAINE LIPOSOME 1.3 % IJ SUSP
20.0000 mL | Freq: Once | INTRAMUSCULAR | Status: DC
  Filled 2017-09-27: qty 20

## 2017-09-27 MED ORDER — PHENYLEPHRINE 40 MCG/ML (10ML) SYRINGE FOR IV PUSH (FOR BLOOD PRESSURE SUPPORT)
PREFILLED_SYRINGE | INTRAVENOUS | Status: AC
  Filled 2017-09-27: qty 10

## 2017-09-27 MED ORDER — MEPERIDINE HCL 25 MG/ML IJ SOLN: 6.2500 mg | INTRAMUSCULAR | Status: DC | PRN

## 2017-09-27 MED ORDER — ONDANSETRON HCL 4 MG/2ML IJ SOLN
INTRAMUSCULAR | Status: AC
  Filled 2017-09-27: qty 4

## 2017-09-27 MED ORDER — LIDOCAINE 2% (20 MG/ML) 5 ML SYRINGE
INTRAMUSCULAR | Status: DC | PRN
  Administered 2017-09-27: 60 mg via INTRAVENOUS

## 2017-09-27 MED ORDER — MUPIROCIN 2 % EX OINT: 1.0000 "application " | TOPICAL_OINTMENT | Freq: Two times a day (BID) | CUTANEOUS | Status: DC

## 2017-09-27 MED ORDER — PROPOFOL 10 MG/ML IV BOLUS
INTRAVENOUS | Status: DC | PRN
  Administered 2017-09-27: 130 mg via INTRAVENOUS

## 2017-09-27 MED ORDER — SODIUM CHLORIDE 0.9 % IV SOLN
2.0000 g | INTRAVENOUS | Status: DC
  Filled 2017-09-27 (×2): qty 2

## 2017-09-27 MED ORDER — ONDANSETRON HCL 4 MG/2ML IJ SOLN
INTRAMUSCULAR | Status: DC | PRN
  Administered 2017-09-27: 4 mg via INTRAVENOUS

## 2017-09-27 MED ORDER — SUGAMMADEX SODIUM 200 MG/2ML IV SOLN
INTRAVENOUS | Status: DC | PRN
  Administered 2017-09-27: 175 mg via INTRAVENOUS

## 2017-09-27 MED ORDER — ONDANSETRON 4 MG PO TBDP: 4.0000 mg | ORAL_TABLET | Freq: Four times a day (QID) | ORAL | Status: DC | PRN

## 2017-09-27 MED ORDER — FENTANYL CITRATE (PF) 250 MCG/5ML IJ SOLN
INTRAMUSCULAR | Status: AC
  Filled 2017-09-27: qty 5

## 2017-09-27 MED ORDER — HEPARIN SODIUM (PORCINE) 5000 UNIT/ML IJ SOLN
5000.0000 [IU] | Freq: Three times a day (TID) | INTRAMUSCULAR | Status: DC
Start: 2017-09-28 — End: 2017-10-01
  Administered 2017-09-28 – 2017-10-01 (×8): 5000 [IU] via SUBCUTANEOUS
  Filled 2017-09-27 (×9): qty 1

## 2017-09-27 MED ORDER — SUGAMMADEX SODIUM 500 MG/5ML IV SOLN
INTRAVENOUS | Status: AC
  Filled 2017-09-27: qty 5

## 2017-09-27 MED ORDER — LACTATED RINGERS IV SOLN
INTRAVENOUS | Status: DC | PRN
  Administered 2017-09-27 (×2): via INTRAVENOUS

## 2017-09-27 SURGICAL SUPPLY — 68 items
APPLIER CLIP 5 13 M/L LIGAMAX5 (MISCELLANEOUS)
APPLIER CLIP ROT 10 11.4 M/L (STAPLE)
BARRIER SKIN 2 3/4 (OSTOMY) ×2 IMPLANT
BARRIER SKIN 2 3/4 INCH (OSTOMY) ×1
BARRIER SKIN OD2.25 2 3/4 FLNG (OSTOMY) IMPLANT
BLADE EXTENDED COATED 6.5IN (ELECTRODE) IMPLANT
CABLE HIGH FREQUENCY MONO STRZ (ELECTRODE) ×1 IMPLANT
CELLS DAT CNTRL 66122 CELL SVR (MISCELLANEOUS) ×1 IMPLANT
CHLORAPREP W/TINT 26ML (MISCELLANEOUS) ×3 IMPLANT
CLIP APPLIE 5 13 M/L LIGAMAX5 (MISCELLANEOUS) IMPLANT
CLIP APPLIE ROT 10 11.4 M/L (STAPLE) IMPLANT
COUNTER NEEDLE 20 DBL MAG RED (NEEDLE) ×3 IMPLANT
COVER MAYO STAND STRL (DRAPES) ×9 IMPLANT
COVER SURGICAL LIGHT HANDLE (MISCELLANEOUS) ×3 IMPLANT
DECANTER SPIKE VIAL GLASS SM (MISCELLANEOUS) ×3 IMPLANT
DRAPE LAPAROSCOPIC ABDOMINAL (DRAPES) ×3 IMPLANT
DRSG OPSITE POSTOP 3X4 (GAUZE/BANDAGES/DRESSINGS) ×2 IMPLANT
DRSG OPSITE POSTOP 4X10 (GAUZE/BANDAGES/DRESSINGS) IMPLANT
DRSG OPSITE POSTOP 4X6 (GAUZE/BANDAGES/DRESSINGS) IMPLANT
DRSG OPSITE POSTOP 4X8 (GAUZE/BANDAGES/DRESSINGS) IMPLANT
DRSG TEGADERM 2-3/8X2-3/4 SM (GAUZE/BANDAGES/DRESSINGS) ×2 IMPLANT
ELECT REM PT RETURN 15FT ADLT (MISCELLANEOUS) ×3 IMPLANT
GAUZE SPONGE 2X2 8PLY STRL LF (GAUZE/BANDAGES/DRESSINGS) IMPLANT
GAUZE SPONGE 4X4 12PLY STRL (GAUZE/BANDAGES/DRESSINGS) IMPLANT
GLOVE BIOGEL M 8.0 STRL (GLOVE) ×6 IMPLANT
GOWN STRL REUS W/TWL XL LVL3 (GOWN DISPOSABLE) ×18 IMPLANT
HANDLE STAPLE EGIA 4 XL (STAPLE) ×2 IMPLANT
LEGGING LITHOTOMY PAIR STRL (DRAPES) IMPLANT
LIGASURE IMPACT 36 18CM CVD LR (INSTRUMENTS) ×2 IMPLANT
PACK COLON (CUSTOM PROCEDURE TRAY) ×3 IMPLANT
PAD POSITIONING PINK XL (MISCELLANEOUS) ×3 IMPLANT
PORT LAP GEL ALEXIS MED 5-9CM (MISCELLANEOUS) IMPLANT
POSITIONER SURGICAL ARM (MISCELLANEOUS) ×6 IMPLANT
POUCH OSTOMY 2 3/4  H 3804 (WOUND CARE) ×2
POUCH OSTOMY 2 PC DRNBL 2.75 (WOUND CARE) IMPLANT
RELOAD EGIA 45 MED/THCK PURPLE (STAPLE) ×2 IMPLANT
RELOAD EGIA 60 MED/THCK PURPLE (STAPLE) ×9 IMPLANT
RELOAD STAPLE 60 MED/THCK ART (STAPLE) IMPLANT
RETRACTOR WND ALEXIS 18 MED (MISCELLANEOUS) IMPLANT
RTRCTR WOUND ALEXIS 18CM MED (MISCELLANEOUS) ×3
SCISSORS LAP 5X45 EPIX DISP (ENDOMECHANICALS) ×3 IMPLANT
SET IRRIG TUBING LAPAROSCOPIC (IRRIGATION / IRRIGATOR) ×3 IMPLANT
SLEEVE XCEL OPT CAN 5 100 (ENDOMECHANICALS) ×6 IMPLANT
SPONGE GAUZE 2X2 STER 10/PKG (GAUZE/BANDAGES/DRESSINGS) ×2
STAPLER VISISTAT 35W (STAPLE) ×3 IMPLANT
SUT CHROMIC 3 0 SH 27 (SUTURE) ×6 IMPLANT
SUT PDS AB 1 CTX 36 (SUTURE) IMPLANT
SUT PDS AB 1 TP1 96 (SUTURE) IMPLANT
SUT PDS AB 4-0 SH 27 (SUTURE) IMPLANT
SUT PROLENE 2 0 KS (SUTURE) ×2 IMPLANT
SUT SILK 2 0 (SUTURE) ×2
SUT SILK 2 0 SH CR/8 (SUTURE) ×3 IMPLANT
SUT SILK 2-0 18XBRD TIE 12 (SUTURE) ×1 IMPLANT
SUT SILK 3 0 (SUTURE) ×2
SUT SILK 3 0 SH CR/8 (SUTURE) ×3 IMPLANT
SUT SILK 3-0 18XBRD TIE 12 (SUTURE) ×1 IMPLANT
SUT VIC AB 2-0 SH 18 (SUTURE) ×2 IMPLANT
SUT VIC AB 4-0 SH 18 (SUTURE) ×3 IMPLANT
SYS LAPSCP GELPORT 120MM (MISCELLANEOUS)
SYSTEM LAPSCP GELPORT 120MM (MISCELLANEOUS) IMPLANT
TOWEL OR NON WOVEN STRL DISP B (DISPOSABLE) ×3 IMPLANT
TRAY FOLEY W/BAG SLVR 14FR (SET/KITS/TRAYS/PACK) ×2 IMPLANT
TRAY FOLEY W/METER SILVER 16FR (SET/KITS/TRAYS/PACK) IMPLANT
TROCAR BLADELESS OPT 5 100 (ENDOMECHANICALS) ×3 IMPLANT
TROCAR XCEL NON-BLD 11X100MML (ENDOMECHANICALS) IMPLANT
TUBING CONNECTING 10 (TUBING) IMPLANT
TUBING CONNECTING 10' (TUBING)
TUBING INSUF HEATED (TUBING) ×3 IMPLANT

## 2017-09-27 NOTE — Progress Notes (Signed)
PT Cancellation Note  Patient Details Name: Maria Christian MRN: [id number] DOB: Nov 22, 1924   Cancelled Treatment:    Reason Eval/Treat Not Completed: Other (comment); RN reports patient going to surgery momentarily.  Will attempt another day.   Reginia Naas 09/27/2017, 1:39 PM  Magda Kiel, [place] 09/27/2017

## 2017-09-27 NOTE — Op Note (Signed)
ITZY ADLER  11-04-1924 September 27, 2017   PCP:  [name], DO   Surgeon: Kaylyn Lim, MD, FACS  Asst:  None  Anes:  General  Preop Dx: Recurrent sigmoid volvulus Postop Dx: Same post sigmoid colectomy with Hartmann procedure and end colostomy  Procedure: Laparoscopically assisted sigmoid colectomy with Jeanette Caprice procedure and end colostomy Location Surgery: [facility] long room #4 Complications: None  EBL:   50 cc  Drains: None  Description of Procedure:  The patient was taken to OR for.  After anesthesia was administered and the patient was prepped a timeout was performed.  The access to the abdomen was achieved to the left upper quadrant using a 5 mm Optiview without difficulty.  The omentum was found to be stuck to the midline and left lower quadrants.  A second 5 mm was placed laterally on the left and through that using scissors I took down these adhesions to the abdominal wall.  When they fell away the rather huge sigmoid colon was visible.  At that point I elected to make a lower midline incision which eventually enlarged to slightly above the umbilicus and use a standard HandPort as a wound retractor.  This gigantic colon was exteriorized.  Approximately I divided it with purple load Endo GIA tri-stapler and one firing.  I went through the mesentery with the LigaSure staying fairly close to the bowel and then divided the bowel distally with multiple applications of the purple load tri-stapler.  The distended distal bowel was traced down to the pelvis and proximally the small bowel could come upstream along the left gutter and was the descending colon.  I looked at the ends and the distal bowel was quite thin and I was concerned about the viability of an anastomosis or a wart brought to to create one either stapled or handsewn.  I have elected to bring out a end colostomy as above and leave a Hartmann pouch distally.  Site was selected about the level of the umbilicus and a defect  created in the left lower quadrant.  Colon was brought out through that and again the orientation was checked and this was the descending colon.  It was tacked inside to the peritoneum with 2-0 Vicryl.  The wound was then closed.  The sponge and needle counts were correct.  There was no spillage of stool in this case.  The incision was closed with running double-stranded PDS from above and below tied in the middle with small bites of fascia.  Wound was then irrigated copiously and then closed with staples and then with Dermabond to protect it from the ostomy.  The ostomy was matured with running locking sutures of 3-0 chromic and then appliance was applied.  Staples were used in the lateral 5 mm ports.  The patient tolerated the procedure well and was taken to the PACU in stable condition.     Matt B. Hassell Done, [place], [facility], [place]

## 2017-09-27 NOTE — Anesthesia Procedure Notes (Signed)
Procedure Name: Intubation Date/Time: 09/27/2017 3:57 PM Performed by: Cynda Familia, CRNA Pre-anesthesia Checklist: Patient identified, Emergency Drugs available, Suction available and Patient being monitored Patient Re-evaluated:Patient Re-evaluated prior to induction Oxygen Delivery Method: Circle System Utilized Preoxygenation: Pre-oxygenation with 100% oxygen Induction Type: IV induction Ventilation: Mask ventilation without difficulty Laryngoscope Size: Miller and 2 Grade View: Grade I Tube type: Oral Number of attempts: 1 Airway Equipment and Method: Stylet Placement Confirmation: ETT inserted through vocal cords under direct vision,  positive ETCO2 and breath sounds checked- equal and bilateral Secured at: 22 cm Tube secured with: Tape Dental Injury: Teeth and Oropharynx as per pre-operative assessment  Comments: Smooth IV induction Carignan-- intubation AM CRNA atraumatic-- teeth and mouth as preop-- bilat BS Carignan

## 2017-09-27 NOTE — Progress Notes (Signed)
CSW following to assist with disposition as pt admitted from facility- long term care resident at [facility] and plan is to return at [place]. Updated facility on pt planning for surgery.  Sharren Bridge, MSW, LCSW Clinical Social Work 09/27/2017 [phone]

## 2017-09-27 NOTE — Progress Notes (Signed)
[facility] Progress Note  Patient: Maria Christian ZWC:[id number]   PCP: [name], DO DOB: September 03, 1924   DOA: 09/15/2017   DOS: 09/27/2017   Date of Service: the patient was seen and examined on 09/27/2017  Subjective: No nausea or vomiting.  No acute complaint.  Passing gas.  No bowel movement.  Brief hospital course: Pt. with PMH of HTN, A. fib, chronic diastolic CHF, recurrent sigmoid volvulus; admitted on 09/15/2017, presented with complaint of abdominal pain, was found to have sigmoid volvulus.  Initially patient refused surgical correction and GI was consulted.  Underwent decompression x2 with GI without any significant improvement and therefore palliative care was consulted.  After discussion with son and palliative care patient decided to proceed with surgical correction. Currently further plan is awaiting surgery.  Assessment and Plan: 1. recurrent sigmoid volvulus. S/P decompression x2 by GI. On 09/17/2017 and 09/18/2017. Appreciate GI assistance.  Also on colonoscopy there are some areas of ulceration in the colon concerning for ischemia no malignancy. Repeat x-ray on 422 shows persistent sigmoid volvulus. Currently on clear liquid diet as well as MiraLAX. Patient and family considering surgery, scheduled for today.  We will monitor postoperative course.  2.  Acute kidney injury. Prerenal etiology. Baseline serum creatinine around 1, on admission 2.1.  Getting better with hydration.  Monitor.  3.  Hypokalemia. 60 mg of potassium daily. Patient having difficulty swallowing pills, will change to packet..  4. ?Chronic respiratory failure with hypoxia Currently on room air, sat well On oxygen at nursing home, monitor closely  5. Chronic headache/?? Migraine Ongoing for years, worsening over the past couple of months as per daughter CT scan of the head on 2018 showed no acute abnormalities Patient has an appointment with neurology outpatient on Oct 10, 2017, daughter   insisting patient has MRI prior to the visit currently not indicated. Continue Excedrin Migraine  6. Chronic atrial fibrillation Rate controlled  No anticoagulation likely due to risk of bleeding/age  42. Macrocytic anemia: Hgb at baseline Vit B 12 on 9/18 685, folate WNL   8. Chronic diastolic heart failure Appears to beeuvolemic Hold Lasix for now  9. Chronic thrombocytopenia Appears to be stable  10. Dementia with behavioral disturbance Seroquel at night, IV Haldol as needed  Diet: N.p.o. for surgery.  Advance per surgery after surgery. DVT Prophylaxis: subcutaneous Heparin  Advance goals of care discussion: DNR DNI  Family Communication: family was present at bedside, at the time of interview. The pt provided permission to discuss medical plan with the family. Opportunity was given to ask question and all questions were answered satisfactorily.   Disposition:  Discharge to be determined.  Consultants: gastroenterology General surgery  Palliative care  Procedures: Colonoscopy with decompression, 09/17/2017, 09/18/2017.  Antibiotics: Anti-infectives (From admission, onward)   Start     Dose/Rate Route Frequency Ordered Stop   09/27/17 1200  cefoTEtan (CEFOTAN) 2 g in sodium chloride 0.9 % 100 mL IVPB  Status:  Discontinued     2 g 200 mL/hr over 30 Minutes Intravenous On call to O.R. 09/27/17 0941 09/27/17 1705       Objective: Physical Exam: Vitals:   09/26/17 2146 09/27/17 0556 09/27/17 0800 09/27/17 1244  BP: (!) 127/55 (!) 161/68 139/62 (!) 160/57  Pulse: 86 78 78 70  Resp: 14 17 18 16   Temp: 98.2 F (36.8 C) 98.7 F (37.1 C) 98.3 F (36.8 C) 98.2 F (36.8 C)  TempSrc: Oral Oral Oral Oral  SpO2: 97% 97% 97% 96%  Weight:      Height:        Intake/Output Summary (Last 24 hours) at 09/27/2017 1711 Last data filed at 09/27/2017 1655 Gross per 24 hour  Intake 1350 ml  Output -  Net 1350 ml   Filed Weights   09/15/17 2200 09/16/17 1105    Weight: 71 kg (156 lb 8.4 oz) 71 kg (156 lb 8.4 oz)   General: Alert, Awake and Oriented to Time, Place and Person. Appear in mild distress, affect appropriate Eyes: PERRL, Conjunctiva normal ENT: Oral Mucosa clear moist. Neck: no JVD, no Abnormal Mass Or lumps Cardiovascular: S1 and S2 Present, no Murmur, Peripheral Pulses Present Respiratory: normal respiratory effort, Bilateral Air entry equal and Decreased, no use of accessory muscle, Clear to Auscultation, no Crackles, no wheezes Abdomen: Bowel Sound present, Soft and mild tenderness, no hernia Skin: no redness, no Rash, no induration Extremities: no Pedal edema, no calf tenderness Neurologic: Grossly no focal neuro deficit. Bilaterally Equal motor strength  Data Reviewed: CBC: Recent Labs  Lab 09/23/17 0417 09/24/17 0506 09/25/17 0428 09/26/17 0354 09/27/17 0349  WBC 4.3 3.7* 3.6* 3.3* 3.8*  NEUTROABS 2.3 2.0 2.0 1.6* 1.9  HGB 9.9* 9.5* 9.9* 9.6* 9.9*  HCT 30.2* 29.4* 31.1* 30.6* 30.8*  MCV 104.9* 105.4* 105.4* 105.2* 104.4*  PLT 143* 136* 150 152 751*   Basic Metabolic Panel: Recent Labs  Lab 09/22/17 0327 09/23/17 0417 09/24/17 0506 09/26/17 0354 09/27/17 0349  NA 144 145 144 144 143  K 3.7 3.4* 3.5 3.5 3.5  CL 117* 119* 117* 119* 119*  CO2 19* 18* 18* 18* 18*  GLUCOSE 138* 141* 137* 124* 130*  BUN 9 8 6 6 6   CREATININE 0.93 0.89 0.95 1.01* 1.12*  CALCIUM 9.2 9.0 8.9 9.1 8.9    Liver Function Tests: No results for input(s): AST, ALT, ALKPHOS, BILITOT, PROT, ALBUMIN in the last 168 hours. No results for input(s): LIPASE, AMYLASE in the last 168 hours. No results for input(s): AMMONIA in the last 168 hours. Coagulation Profile: No results for input(s): INR, PROTIME in the last 168 hours. Cardiac Enzymes: No results for input(s): CKTOTAL, CKMB, CKMBINDEX, TROPONINI in the last 168 hours. BNP (last 3 results) No results for input(s): PROBNP in the last 8760 hours. CBG: No results for input(s): GLUCAP in  the last 168 hours. Studies: No results found.  Scheduled Meds: . amLODipine  10 mg Oral Daily  . baclofen  5 mg Oral QHS  . diclofenac sodium  2 g Topical BID  . dorzolamide  1 drop Both Eyes TID  . famotidine  20 mg Oral Daily  . feeding supplement  1 Container Oral TID BM  . gabapentin  100 mg Oral QHS  . latanoprost  1 drop Both Eyes QHS  . lidocaine  1 patch Transdermal Q24H  . multivitamin with minerals  1 tablet Oral Daily  . mupirocin ointment  1 application Nasal BID  . nystatin  1 g Topical BID  . polyethylene glycol  17 g Oral Daily  . potassium chloride SA  20 mEq Oral QHS  . potassium chloride  40 mEq Oral Daily   Continuous Infusions: PRN Meds: acetaminophen **OR** acetaminophen, aspirin-acetaminophen-caffeine, benzocaine-menthol, haloperidol lactate, ipratropium-albuterol, ondansetron **OR** ondansetron (ZOFRAN) IV, phenol, QUEtiapine, sodium chloride  Time spent: 35 minutes  Author: Berle Mull, MD [facility] Pager: [phone] 09/27/2017 5:11 PM  If 7PM-7AM, please contact night-coverage at [email or web address], password [facility]

## 2017-09-27 NOTE — Anesthesia Procedure Notes (Signed)
Date/Time: 09/27/2017 5:53 PM Performed by: Cynda Familia, CRNA Oxygen Delivery Method: Simple face mask Placement Confirmation: positive ETCO2 and breath sounds checked- equal and bilateral Dental Injury: Teeth and Oropharynx as per pre-operative assessment

## 2017-09-27 NOTE — Transfer of Care (Signed)
Immediate Anesthesia Transfer of Care Note  Patient: Maria Christian  Procedure(s) Performed: LAPAROSCOPIC ASSISTED  SIGMOID COLECTOMY WITH COLOSTOMY (N/A Abdomen)  Patient Location: PACU  Anesthesia Type:General  Level of Consciousness: sedated  Airway & Oxygen Therapy: Patient Spontanous Breathing and Patient connected to face mask oxygen  Post-op Assessment: Report given to RN and Post -op Vital signs reviewed and stable  Post vital signs: Reviewed and stable  Last Vitals:  Vitals Value Taken Time  BP 117/55 09/27/2017  6:03 PM  Temp    Pulse 79 09/27/2017  6:05 PM  Resp 27 09/27/2017  6:05 PM  SpO2 100 % 09/27/2017  6:05 PM  Vitals shown include unvalidated device data.  Last Pain:  Vitals:   09/27/17 1244  TempSrc: Oral  PainSc: 0-No pain      Patients Stated Pain Goal: 0 (29/92/42 6834)  Complications: No apparent anesthesia complications

## 2017-09-27 NOTE — Progress Notes (Signed)
Initial Nutrition Assessment  DOCUMENTATION CODES:   Severe malnutrition in context of chronic illness  INTERVENTION:   Once diet advanced, resume Boost Breeze po TID, each supplement provides 250 kcal and 9 grams of protein  NUTRITION DIAGNOSIS:   Severe Malnutrition related to chronic illness(dementia, recurrent sigmoid volvulus) as evidenced by severe fat depletion, severe muscle depletion, energy intake < or equal to 50% for > or equal to 5 days.  GOAL:   Patient will meet greater than or equal to 90% of their needs  MONITOR:   PO intake, Labs, Supplement acceptance, Weight trends, I & O's  REASON FOR ASSESSMENT:   Low [place], LOS   ASSESSMENT:   Pt. with PMH of HTN, A. fib, chronic diastolic CHF, recurrent sigmoid volvulus; admitted on 09/15/2017, presented with complaint of abdominal pain, was found to have sigmoid volvulus.   Patient with LOS x 11 days. During this admission, pt has eaten very little. Pt reports not feeling hungry and is lethargic. Pt had N/V PTA x 3 days.  Per Palliative care note: pt's family wanting surgical interventions. Pt currently NPO for pending surgery. Palliative ordered patient Boost Breeze supplements when she was on clear liquids. Would resume once diet is advanced.   Per chart review, pt's weight has remained stable since 2018.   Medications: Multivitamin with minerals daily, Miralax packet daily, K-DUR tablet daily Labs reviewed: GFR: 42   NUTRITION - FOCUSED PHYSICAL EXAM:    Most Recent Value  Orbital Region  Severe depletion  Upper Arm Region  Severe depletion  Thoracic and Lumbar Region  Unable to assess  Buccal Region  Moderate depletion  Temple Region  Severe depletion  Clavicle Bone Region  Severe depletion  Clavicle and Acromion Bone Region  Severe depletion  Scapular Bone Region  Unable to assess  Dorsal Hand  Unable to assess  Patellar Region  Unable to assess  Anterior Thigh Region  Unable to assess  Posterior Calf  Region  Unable to assess  Edema (RD Assessment)  None       Diet Order:  Diet NPO time specified  EDUCATION NEEDS:   No education needs have been identified at this time  Skin:  Skin Assessment: Reviewed RN Assessment  Last BM:  4/22  Height:   Ht Readings from Last 1 Encounters:  09/16/17 5\' 5"  (1.651 m)    Weight:   Wt Readings from Last 1 Encounters:  09/16/17 156 lb 8.4 oz (71 kg)    Ideal Body Weight:  56.8 kg  BMI:  Body mass index is 26.05 kg/m.  Estimated Nutritional Needs:   Kcal:  1400-1600  Protein:  70-80g  Fluid:  1.6L/day   Clayton Bibles, MS, RD, LDN [place] [facility] Pager: [phone] After Hours Pager: [phone]

## 2017-09-27 NOTE — Progress Notes (Signed)
Right lower extremity venous duplex has been completed. Negative for obvious evidence of DVT.  09/27/17 2:24 PM Maria Christian RVT

## 2017-09-27 NOTE — Interval H&P Note (Signed)
History and Physical Interval Note:  09/27/2017 3:03 PM  Maria Christian  has presented today for surgery, with the diagnosis of recurrent sigmoid volvulus  The various methods of treatment have been discussed with the patient and family. After consideration of risks, benefits and other options for treatment, the patient has consented to  Procedure(s): [place] (N/A) as a surgical intervention .  The patient's history has been reviewed, patient examined, no change in status, stable for surgery.  I have reviewed the patient's chart and labs.  Questions were answered to the patient's satisfaction.     [name]

## 2017-09-27 NOTE — Anesthesia Preprocedure Evaluation (Signed)
Anesthesia Evaluation  Patient identified by MRN, date of birth, ID band Patient confused    Reviewed: Allergy & Precautions, NPO status , Patient's Chart, lab work & pertinent test results  Airway Mallampati: II  TM Distance: >3 FB Neck ROM: Full    Dental no notable dental hx. (+) Edentulous Upper, Edentulous Lower   Pulmonary neg pulmonary ROS,    Pulmonary exam normal breath sounds clear to auscultation       Cardiovascular hypertension, + CAD and +CHF  + dysrhythmias Atrial Fibrillation  Rhythm:Irregular Rate:Normal     Neuro/Psych Dementia TIA   GI/Hepatic negative GI ROS, Neg liver ROS,   Endo/Other  diabetes  Renal/GU negative Renal ROS  negative genitourinary   Musculoskeletal negative musculoskeletal ROS (+)   Abdominal   Peds negative pediatric ROS (+)  Hematology negative hematology ROS (+)   Anesthesia Other Findings DNR  Reproductive/Obstetrics negative OB ROS                             Anesthesia Physical Anesthesia Plan  ASA: IV  Anesthesia Plan: General   Post-op Pain Management:    Induction: Intravenous  PONV Risk Score and Plan: 2 and Treatment may vary due to age or medical condition and Ondansetron  Airway Management Planned: Oral ETT  Additional Equipment:   Intra-op Plan:   Post-operative Plan: Post-operative intubation/ventilation and Extubation in OR  Informed Consent: I have reviewed the patients History and Physical, chart, labs and discussed the procedure including the risks, benefits and alternatives for the proposed anesthesia with the patient or authorized representative who has indicated his/her understanding and acceptance.   Dental advisory given  Plan Discussed with:   Anesthesia Plan Comments:         Anesthesia Quick Evaluation

## 2017-09-28 ENCOUNTER — Encounter (HOSPITAL_COMMUNITY): Payer: Self-pay | Admitting: Surgery

## 2017-09-28 LAB — CBC
HCT: 26.8 % — ABNORMAL LOW (ref 36.0–46.0)
HEMATOCRIT: 25.4 % — AB (ref 36.0–46.0)
HEMOGLOBIN: 8.6 g/dL — AB (ref 12.0–15.0)
Hemoglobin: 8.4 g/dL — ABNORMAL LOW (ref 12.0–15.0)
MCH: 34.4 pg — ABNORMAL HIGH (ref 26.0–34.0)
MCH: 33.6 pg (ref 26.0–34.0)
MCHC: 33.1 g/dL (ref 30.0–36.0)
MCHC: 32.1 g/dL (ref 30.0–36.0)
MCV: 104.1 fL — ABNORMAL HIGH (ref 78.0–100.0)
MCV: 104.7 fL — ABNORMAL HIGH (ref 78.0–100.0)
Platelets: 128 10*3/uL — ABNORMAL LOW (ref 150–400)
Platelets: 128 10*3/uL — ABNORMAL LOW (ref 150–400)
RBC: 2.44 MIL/uL — ABNORMAL LOW (ref 3.87–5.11)
RBC: 2.56 MIL/uL — ABNORMAL LOW (ref 3.87–5.11)
RDW: 15.1 % (ref 11.5–15.5)
RDW: 15.2 % (ref 11.5–15.5)
WBC: 10.7 10*3/uL — ABNORMAL HIGH (ref 4.0–10.5)
WBC: 7.6 10*3/uL (ref 4.0–10.5)

## 2017-09-28 LAB — GLUCOSE, CAPILLARY
GLUCOSE-CAPILLARY: 123 mg/dL — AB (ref 65–99)
GLUCOSE-CAPILLARY: 144 mg/dL — AB (ref 65–99)
GLUCOSE-CAPILLARY: 178 mg/dL — AB (ref 65–99)
Glucose-Capillary: 130 mg/dL — ABNORMAL HIGH (ref 65–99)
Glucose-Capillary: 149 mg/dL — ABNORMAL HIGH (ref 65–99)

## 2017-09-28 LAB — BASIC METABOLIC PANEL
ANION GAP: 7 (ref 5–15)
BUN: 8 mg/dL (ref 6–20)
CO2: 18 mmol/L — AB (ref 22–32)
Calcium: 8.2 mg/dL — ABNORMAL LOW (ref 8.9–10.3)
Chloride: 117 mmol/L — ABNORMAL HIGH (ref 101–111)
Creatinine, Ser: 1.18 mg/dL — ABNORMAL HIGH (ref 0.44–1.00)
GFR calc non Af Amer: 39 mL/min — ABNORMAL LOW (ref >60)
GFR, EST AFRICAN AMERICAN: 46 mL/min — AB (ref >60)
Glucose, Bld: 227 mg/dL — ABNORMAL HIGH (ref 65–99)
POTASSIUM: 3.7 mmol/L (ref 3.5–5.1)
Sodium: 142 mmol/L (ref 135–145)

## 2017-09-28 MED ORDER — ACETAMINOPHEN 500 MG PO TABS
1000.0000 mg | ORAL_TABLET | Freq: Three times a day (TID) | ORAL | Status: DC
  Administered 2017-09-28 – 2017-10-01 (×10): 1000 mg via ORAL
  Filled 2017-09-28 (×10): qty 2

## 2017-09-28 MED ORDER — INSULIN ASPART 100 UNIT/ML ~~LOC~~ SOLN
0.0000 [IU] | SUBCUTANEOUS | Status: DC
  Administered 2017-09-28: 2 [IU] via SUBCUTANEOUS
  Administered 2017-09-28 – 2017-09-30 (×10): 1 [IU] via SUBCUTANEOUS
  Administered 2017-10-01: 2 [IU] via SUBCUTANEOUS
  Administered 2017-10-01: 1 [IU] via SUBCUTANEOUS
  Administered 2017-10-01: 2 [IU] via SUBCUTANEOUS
  Administered 2017-10-02: 1 [IU] via SUBCUTANEOUS
  Administered 2017-10-02: 2 [IU] via SUBCUTANEOUS
  Administered 2017-10-02 (×2): 1 [IU] via SUBCUTANEOUS

## 2017-09-28 MED ORDER — FENTANYL CITRATE (PF) 100 MCG/2ML IJ SOLN
12.5000 ug | INTRAMUSCULAR | Status: DC | PRN
  Administered 2017-09-28 – 2017-09-29 (×3): 25 ug via INTRAVENOUS
  Administered 2017-09-30: 12.5 ug via INTRAVENOUS
  Filled 2017-09-28 (×4): qty 2

## 2017-09-28 MED ORDER — METHOCARBAMOL 500 MG PO TABS
500.0000 mg | ORAL_TABLET | Freq: Three times a day (TID) | ORAL | Status: DC
  Administered 2017-09-28 – 2017-10-02 (×11): 500 mg via ORAL
  Filled 2017-09-28 (×11): qty 1

## 2017-09-28 NOTE — Progress Notes (Signed)
[facility] Progress Note  Patient: Maria Christian VQQ:[id number]   PCP: [name], DO DOB: [birth year]   DOA: 09/15/2017   DOS: 09/28/2017   Date of Service: the patient was seen and examined on 09/28/2017  Subjective: Drowsy postprocedure.  Although able to follow commands.  There was some report of blood in her colostomy bag.  Brief hospital course: Pt. with PMH of HTN, A. fib, chronic diastolic CHF, recurrent sigmoid volvulus; admitted on 09/15/2017, presented with complaint of abdominal pain, was found to have sigmoid volvulus.  Initially patient refused surgical correction and GI was consulted.  Underwent decompression x2 with GI without any significant improvement and therefore palliative care was consulted.  After discussion with son and palliative care patient decided to proceed with surgical correction. Currently further plan is to monitor postoperative recovery.  Assessment and Plan: 1. recurrent sigmoid volvulus. S/P decompression x2 by GI. On 09/17/2017 and 09/18/2017. Appreciate GI assistance.  Also on colonoscopy there are some areas of ulceration in the colon concerning for ischemia no malignancy. Repeat x-ray on 422 shows persistent sigmoid volvulus. Currently on clear liquid diet as well as MiraLAX. S/P colectomy as well as colostomy with Hartmann procedure. Currently on ice chips.  Management per surgery.  Appreciate their input.  2.  Acute kidney injury. Prerenal etiology. Baseline serum creatinine around 1, on admission 2.1.  Getting better with hydration.  Monitor.  3.  Hypokalemia. 60 mg of potassium daily. Patient having difficulty swallowing pills, will change to packet..  4. ?Chronic respiratory failure with hypoxia Currently on room air, sat well On oxygen at nursing home, monitor closely  5. Chronic headache/?? Migraine Ongoing for years, worsening over the past couple of months as per daughter CT scan of the head on 2018 showed no acute  abnormalities Patient has an appointment with neurology outpatient on Oct 10, 2017, daughter  insisting patient has MRI prior to the visit currently not indicated. Continue Excedrin Migraine  6. Chronic atrial fibrillation Rate controlled  No anticoagulation likely due to risk of bleeding/age  33. Macrocytic anemia: Hgb at baseline Vit B 12 on 9/18 685, folate WNL   8. Chronic diastolic heart failure Appears to beeuvolemic Hold Lasix for now  9. Chronic thrombocytopenia Appears to be stable  10. Dementia with behavioral disturbance Seroquel at night, IV Haldol as needed  Diet: N.p.o. for surgery.  Advance per surgery after surgery. DVT Prophylaxis: subcutaneous Heparin  Advance goals of care discussion: DNR DNI  Family Communication: family was present at bedside, at the time of interview. The pt provided permission to discuss medical plan with the family. Opportunity was given to ask question and all questions were answered satisfactorily.   Disposition:  Discharge to be determined.  Consultants: gastroenterology General surgery  Palliative care  Procedures: Colonoscopy with decompression, 09/17/2017, 09/18/2017.  Antibiotics: Anti-infectives (From admission, onward)   Start     Dose/Rate Route Frequency Ordered Stop   09/28/17 0400  cefoTEtan (CEFOTAN) 2 g in sodium chloride 0.9 % 100 mL IVPB     2 g 200 mL/hr over 30 Minutes Intravenous Every 12 hours 09/27/17 1901 09/28/17 0648   09/27/17 1200  cefoTEtan (CEFOTAN) 2 g in sodium chloride 0.9 % 100 mL IVPB  Status:  Discontinued     2 g 200 mL/hr over 30 Minutes Intravenous On call to O.R. 09/27/17 0941 09/27/17 1705       Objective: Physical Exam: Vitals:   09/27/17 2257 09/28/17 0320 09/28/17 0613 09/28/17 1412  BP:  131/63 (!) 125/57 (!) 109/58 (!) 124/54  Pulse: 83 74 85 72  Resp: 14 16 15 14   Temp: 98.9 F (37.2 C) 99.4 F (37.4 C) 98.4 F (36.9 C) 99.4 F (37.4 C)  TempSrc: Oral Oral Oral Axillary   SpO2: 98% 97% 100% 100%  Weight:      Height:        Intake/Output Summary (Last 24 hours) at 09/28/2017 1750 Last data filed at 09/28/2017 0830 Gross per 24 hour  Intake 300 ml  Output 900 ml  Net -600 ml   Filed Weights   09/15/17 2200 09/16/17 1105  Weight: 71 kg (156 lb 8.4 oz) 71 kg (156 lb 8.4 oz)   General: Alert, Awake and Oriented to Person. Appear in moderate distress, affect appropriate Eyes: PERRL, Conjunctiva normal ENT: Oral Mucosa clear moist. Neck: no JVD, no Abnormal Mass Or lumps Cardiovascular: S1 and S2 Present, no Murmur, Peripheral Pulses Present Respiratory: normal respiratory effort, Bilateral Air entry equal and Decreased, no use of accessory muscle, Clear to Auscultation, no Crackles, no wheezes Abdomen: Bowel Sound present, Soft and mild tenderness, no hernia Skin: no redness, no Rash, no induration Extremities: no Pedal edema, no calf tenderness Neurologic: Grossly no focal neuro deficit. Bilaterally Equal motor strength  Data Reviewed: CBC: Recent Labs  Lab 09/23/17 0417 09/24/17 0506 09/25/17 0428 09/26/17 0354 09/27/17 0349 09/28/17 0423 09/28/17 1347  WBC 4.3 3.7* 3.6* 3.3* 3.8* 10.7* 7.6  NEUTROABS 2.3 2.0 2.0 1.6* 1.9  --   --   HGB 9.9* 9.5* 9.9* 9.6* 9.9* 8.4* 8.6*  HCT 30.2* 29.4* 31.1* 30.6* 30.8* 25.4* 26.8*  MCV 104.9* 105.4* 105.4* 105.2* 104.4* 104.1* 104.7*  PLT 143* 136* 150 152 148* 128* 416*   Basic Metabolic Panel: Recent Labs  Lab 09/23/17 0417 09/24/17 0506 09/26/17 0354 09/27/17 0349 09/28/17 0423  NA 145 144 144 143 142  K 3.4* 3.5 3.5 3.5 3.7  CL 119* 117* 119* 119* 117*  CO2 18* 18* 18* 18* 18*  GLUCOSE 141* 137* 124* 130* 227*  BUN 8 6 6 6 8   CREATININE 0.89 0.95 1.01* 1.12* 1.18*  CALCIUM 9.0 8.9 9.1 8.9 8.2*    Liver Function Tests: No results for input(s): AST, ALT, ALKPHOS, BILITOT, PROT, ALBUMIN in the last 168 hours. No results for input(s): LIPASE, AMYLASE in the last 168 hours. No results  for input(s): AMMONIA in the last 168 hours. Coagulation Profile: No results for input(s): INR, PROTIME in the last 168 hours. Cardiac Enzymes: No results for input(s): CKTOTAL, CKMB, CKMBINDEX, TROPONINI in the last 168 hours. BNP (last 3 results) No results for input(s): PROBNP in the last 8760 hours. CBG: Recent Labs  Lab 09/27/17 1808 09/28/17 1013 09/28/17 1205  GLUCAP 130* 178* 144*   Studies: No results found.  Scheduled Meds: . acetaminophen  1,000 mg Oral TID  . heparin  5,000 Units Subcutaneous Q8H  . insulin aspart  0-9 Units Subcutaneous Q4H  . methocarbamol  500 mg Oral TID   Continuous Infusions: . dextrose 5 % and 0.45 % NaCl with KCl 20 mEq/L 75 mL/hr at 09/28/17 1115  . famotidine (PEPCID) IV Stopped (09/28/17 1104)   PRN Meds: benzocaine-menthol, fentaNYL (SUBLIMAZE) injection, ondansetron **OR** ondansetron (ZOFRAN) IV  Time spent: 35 minutes  Author: Berle Mull, MD [facility] Pager: [phone] 09/28/2017 5:50 PM  If 7PM-7AM, please contact night-coverage at [email or web address], password [facility]

## 2017-09-28 NOTE — Progress Notes (Signed)
[facility] Progress Note  1 Day Post-Op  Subjective: CC- abdominal pain Patient states that her abdomen is very sore this morning. Denies any nausea. No stool from ostomy, but she has had about 800cc bloody drainage. Hg 8.4 from 9.9 yesterday, VSS.  Objective: Vital signs in last 24 hours: Temp:  [97.7 F (36.5 C)-99.4 F (37.4 C)] 98.4 F (36.9 C) (04/26 3220) Pulse Rate:  [64-85] 85 (04/26 0613) Resp:  [14-18] 15 (04/26 0613) BP: (109-160)/(55-63) 109/58 (04/26 0613) SpO2:  [96 %-100 %] 100 % (04/26 0613) Last BM Date: 09/27/17  Intake/Output from previous day: 04/25 0701 - 04/26 0700 In: 1300 [I.V.:1300] Out: 950 [Urine:250; Stool:675; Blood:25] Intake/Output this shift: No intake/output data recorded.  PE: Gen:  Alert, NAD, pleasant HEENT: EOM's intact, pupils equal and round Card:  Irregularly irregular  Pulm:  CTAB, no W/R/R, effort normal Abd: Soft, mild distension, diffuse tenderness, hypoactive BS, midline incision C/D/I with honeycomb dressing in place, ostomy pink with bloody drainage in bag/no stool Psych: Alert and oriented to self  Lab Results:  Recent Labs    09/27/17 0349 09/28/17 0423  WBC 3.8* 10.7*  HGB 9.9* 8.4*  HCT 30.8* 25.4*  PLT 148* 128*   BMET Recent Labs    09/27/17 0349 09/28/17 0423  NA 143 142  K 3.5 3.7  CL 119* 117*  CO2 18* 18*  GLUCOSE 130* 227*  BUN 6 8  CREATININE 1.12* 1.18*  CALCIUM 8.9 8.2*   PT/INR No results for input(s): LABPROT, INR in the last 72 hours. CMP     Component Value Date/Time   NA 142 09/28/2017 0423   NA 144 07/10/2017   K 3.7 09/28/2017 0423   CL 117 (H) 09/28/2017 0423   CO2 18 (L) 09/28/2017 0423   GLUCOSE 227 (H) 09/28/2017 0423   BUN 8 09/28/2017 0423   BUN 11 07/10/2017   CREATININE 1.18 (H) 09/28/2017 0423   CALCIUM 8.2 (L) 09/28/2017 0423   PROT 8.1 09/15/2017 1650   ALBUMIN 3.9 09/15/2017 1650   AST 19 09/15/2017 1650   ALT 12 (L) 09/15/2017 1650   ALKPHOS 99  09/15/2017 1650   BILITOT 0.8 09/15/2017 1650   GFRNONAA 39 (L) 09/28/2017 0423   GFRAA 46 (L) 09/28/2017 0423   Lipase     Component Value Date/Time   LIPASE 24 05/09/2017 1705       Studies/Results: No results found.  Anti-infectives: Anti-infectives (From admission, onward)   Start     Dose/Rate Route Frequency Ordered Stop   09/28/17 0400  cefoTEtan (CEFOTAN) 2 g in sodium chloride 0.9 % 100 mL IVPB     2 g 200 mL/hr over 30 Minutes Intravenous Every 12 hours 09/27/17 1901 09/28/17 0648   09/27/17 1200  cefoTEtan (CEFOTAN) 2 g in sodium chloride 0.9 % 100 mL IVPB  Status:  Discontinued     2 g 200 mL/hr over 30 Minutes Intravenous On call to O.R. 09/27/17 0941 09/27/17 1705       Assessment/Plan Atrial fibrillation - not on anticoagulants Dementia CKD Chronic diastolic heart failure DNR Anemia - Hg 8.4, monitor AKI - Cr 1.18, continue IVF and strict I&O  Recurrent sigmoid volvulus S/p sigmoid colectomy with Hartmann procedure and end colostomy 4/25 Dr. Hassell Done - POD 1 - ~800cc bloody output from colostomy, no stool  ID - cefotetan perioperative FEN - IVF, chips/sips VTE - SCDs, heparin Foley - wick Follow up - Dr. Hassell Done  Plan - Continue sips/chips until return  in bowel function. Schedule tylenol and robaxin for better pain control. Consult PT. Repeat CBC at 1400 today due to significant amount of bloody output from colostomy.   LOS: 12 days    [name] , [facility] 09/28/2017, 8:58 AM Pager: [phone] Consults: [phone] Mon-Fri 7:00 am-4:30 pm Sat-Sun 7:00 am-11:30 am

## 2017-09-29 LAB — BASIC METABOLIC PANEL
ANION GAP: 7 (ref 5–15)
BUN: 9 mg/dL (ref 6–20)
CALCIUM: 8.1 mg/dL — AB (ref 8.9–10.3)
CHLORIDE: 117 mmol/L — AB (ref 101–111)
CO2: 18 mmol/L — ABNORMAL LOW (ref 22–32)
Creatinine, Ser: 1.22 mg/dL — ABNORMAL HIGH (ref 0.44–1.00)
GFR calc non Af Amer: 38 mL/min — ABNORMAL LOW (ref >60)
GFR, EST AFRICAN AMERICAN: 44 mL/min — AB (ref >60)
Glucose, Bld: 114 mg/dL — ABNORMAL HIGH (ref 65–99)
Potassium: 3.7 mmol/L (ref 3.5–5.1)
Sodium: 142 mmol/L (ref 135–145)

## 2017-09-29 LAB — GLUCOSE, CAPILLARY
GLUCOSE-CAPILLARY: 143 mg/dL — AB (ref 65–99)
GLUCOSE-CAPILLARY: 130 mg/dL — AB (ref 65–99)
GLUCOSE-CAPILLARY: 136 mg/dL — AB (ref 65–99)
GLUCOSE-CAPILLARY: 134 mg/dL — AB (ref 65–99)
GLUCOSE-CAPILLARY: 128 mg/dL — AB (ref 65–99)
Glucose-Capillary: 113 mg/dL — ABNORMAL HIGH (ref 65–99)

## 2017-09-29 LAB — CBC
HEMATOCRIT: 24.1 % — AB (ref 36.0–46.0)
HEMOGLOBIN: 7.9 g/dL — AB (ref 12.0–15.0)
MCH: 34.5 pg — ABNORMAL HIGH (ref 26.0–34.0)
MCHC: 32.8 g/dL (ref 30.0–36.0)
MCV: 105.2 fL — ABNORMAL HIGH (ref 78.0–100.0)
Platelets: 118 10*3/uL — ABNORMAL LOW (ref 150–400)
RBC: 2.29 MIL/uL — AB (ref 3.87–5.11)
RDW: 15.6 % — ABNORMAL HIGH (ref 11.5–15.5)
WBC: 5.7 10*3/uL (ref 4.0–10.5)

## 2017-09-29 NOTE — Progress Notes (Signed)
[facility] Office:  [phone] General Surgery Progress Note   LOS: 13 days  POD -  2 Days Post-Op  Chief Complaint: volvulus  Assessment and Plan: 1.  LAPAROSCOPIC ASSISTED  SIGMOID COLECTOMY WITH COLOSTOMY - 09/27/2017 Hassell Done  On ice chips  Son (from [place]) at bedside  Keep NPO until ostomy starts to function.  2.  A Fib 3.  Dementia 4.  Chronic diastolic heart failure 5.  Anemia  hgb - 7.9 - 09/29/2017 6.  DVT prophylaxis - SQ heparin 7.  On isolation for ESBL   Principal Problem:   Sigmoid volvulus ([place]) Active Problems:   Thrombocytopenia (HCC)   Dementia with behavioral disturbance   Normocytic anemia   Chronic atrial fibrillation (HCC)   Acute kidney injury superimposed on chronic kidney disease (HCC)   GERD (gastroesophageal reflux disease)   Protein-calorie malnutrition, severe   Subjective:  Confused.  Just wants ice.  Son at bedside.  Objective:   Vitals:   09/28/17 1412 09/29/17 0606  BP: (!) 124/54 129/70  Pulse: 72 83  Resp: 14 15  Temp: 99.4 F (37.4 C) 98.9 F (37.2 C)  SpO2: 100% 100%     Intake/Output from previous day:  04/26 0701 - 04/27 0700 In: 4370 [I.V.:4220; IV Piggyback:150] Out: 205 [Stool:205]  Intake/Output this shift:  No intake/output data recorded.   Physical Exam:   General: Older WF.  She is not oriented.    HEENT: Normal. Pupils equal. .   Lungs: Clear.   Abdomen: Soft   Wound: Clean.  Ostomy LLQ looks good.  No stool.   Lab Results:    Recent Labs    09/28/17 1347 09/29/17 0355  WBC 7.6 5.7  HGB 8.6* 7.9*  HCT 26.8* 24.1*  PLT 128* 118*    BMET   Recent Labs    09/28/17 0423 09/29/17 0355  NA 142 142  K 3.7 3.7  CL 117* 117*  CO2 18* 18*  GLUCOSE 227* 114*  BUN 8 9  CREATININE 1.18* 1.22*  CALCIUM 8.2* 8.1*    PT/INR  No results for input(s): LABPROT, INR in the last 72 hours.  ABG  No results for input(s): PHART, HCO3 in the last 72 hours.  Invalid input(s): PCO2,  PO2   Studies/Results:  No results found.   Anti-infectives:   Anti-infectives (From admission, onward)   Start     Dose/Rate Route Frequency Ordered Stop   09/28/17 0400  cefoTEtan (CEFOTAN) 2 g in sodium chloride 0.9 % 100 mL IVPB     2 g 200 mL/hr over 30 Minutes Intravenous Every 12 hours 09/27/17 1901 09/28/17 0648   09/27/17 1200  cefoTEtan (CEFOTAN) 2 g in sodium chloride 0.9 % 100 mL IVPB  Status:  Discontinued     2 g 200 mL/hr over 30 Minutes Intravenous On call to O.R. 09/27/17 0941 09/27/17 1705      Alphonsa Overall, MD, FACS Pager: [facility] Office: [phone] 09/29/2017

## 2017-09-29 NOTE — Progress Notes (Signed)
[facility] Progress Note  Patient: Maria Christian VPX:[id number]   PCP: [name], DO DOB: [birth year]   DOA: 09/15/2017   DOS: 09/29/2017   Date of Service: the patient was seen and examined on 09/29/2017  Subjective: Fatigue.  No acute complaint.  No nausea no vomiting.  No BM.  Brief hospital course: Pt. with PMH of HTN, A. fib, chronic diastolic CHF, recurrent sigmoid volvulus; admitted on 09/15/2017, presented with complaint of abdominal pain, was found to have sigmoid volvulus.  Initially patient refused surgical correction and GI was consulted.  Underwent decompression x2 with GI without any significant improvement and therefore palliative care was consulted.  After discussion with son and palliative care patient decided to proceed with surgical correction. Currently further plan is to monitor postoperative recovery.  Assessment and Plan: 1. recurrent sigmoid volvulus. S/P decompression x2 by GI. On 09/17/2017 and 09/18/2017. Appreciate GI assistance.  Also on colonoscopy there are some areas of ulceration in the colon concerning for ischemia no malignancy. Repeat x-ray on 422 shows persistent sigmoid volvulus. Currently on clear liquid diet as well as MiraLAX. S/P colectomy as well as colostomy with Hartmann procedure. Currently on ice chips.  Management per surgery.  Appreciate their input.  2.  Acute kidney injury. Prerenal etiology. Baseline serum creatinine around 1, on admission 2.1.  Getting better with hydration.  Monitor.  3.  Hypokalemia. 60 mg of potassium daily. Patient having difficulty swallowing pills, will change to packet..  4. ?Chronic respiratory failure with hypoxia Currently on room air, sat well On oxygen at nursing home, monitor closely  5. Chronic headache/?? Migraine Ongoing for years, worsening over the past couple of months as per daughter CT scan of the head on 2018 showed no acute abnormalities Patient has an appointment with neurology  outpatient on Oct 10, 2017, daughter  insisting patient has MRI prior to the visit currently not indicated. Continue Excedrin Migraine  6. Chronic atrial fibrillation Rate controlled  No anticoagulation likely due to risk of bleeding/age  44. Macrocytic anemia: Hgb at baseline Vit B 12 on 9/18 685, folate WNL   8. Chronic diastolic heart failure Appears to beeuvolemic Hold Lasix for now  9. Chronic thrombocytopenia Appears to be stable  10. Dementia with behavioral disturbance Seroquel at night, IV Haldol as needed  Diet: N.p.o. for surgery.  Advance per surgery after surgery. DVT Prophylaxis: subcutaneous Heparin  Advance goals of care discussion: DNR DNI  Family Communication: family was present at bedside, at the time of interview. The pt provided permission to discuss medical plan with the family. Opportunity was given to ask question and all questions were answered satisfactorily.   Disposition:  Discharge to be determined.  Consultants: gastroenterology General surgery  Palliative care  Procedures: Colonoscopy with decompression, 09/17/2017, 09/18/2017.  Antibiotics: Anti-infectives (From admission, onward)   Start     Dose/Rate Route Frequency Ordered Stop   09/28/17 0400  cefoTEtan (CEFOTAN) 2 g in sodium chloride 0.9 % 100 mL IVPB     2 g 200 mL/hr over 30 Minutes Intravenous Every 12 hours 09/27/17 1901 09/28/17 0648   09/27/17 1200  cefoTEtan (CEFOTAN) 2 g in sodium chloride 0.9 % 100 mL IVPB  Status:  Discontinued     2 g 200 mL/hr over 30 Minutes Intravenous On call to O.R. 09/27/17 0941 09/27/17 1705       Objective: Physical Exam: Vitals:   09/28/17 0320 09/28/17 0613 09/28/17 1412 09/29/17 0606  BP: (!) 125/57 (!) 109/58 (!) 124/54  129/70  Pulse: 74 85 72 83  Resp: 16 15 14 15   Temp: 99.4 F (37.4 C) 98.4 F (36.9 C) 99.4 F (37.4 C) 98.9 F (37.2 C)  TempSrc: Oral Oral Axillary Oral  SpO2: 97% 100% 100% 100%  Weight:      Height:          Intake/Output Summary (Last 24 hours) at 09/29/2017 1506 Last data filed at 09/29/2017 0600 Gross per 24 hour  Intake 4370 ml  Output 5 ml  Net 4365 ml   Filed Weights   09/15/17 2200 09/16/17 1105  Weight: 71 kg (156 lb 8.4 oz) 71 kg (156 lb 8.4 oz)   General: Alert, Awake and Oriented to Person. Appear in moderate distress, affect appropriate Eyes: PERRL, Conjunctiva normal ENT: Oral Mucosa clear moist. Neck: no JVD, no Abnormal Mass Or lumps Cardiovascular: S1 and S2 Present, no Murmur, Peripheral Pulses Present Respiratory: normal respiratory effort, Bilateral Air entry equal and Decreased, no use of accessory muscle, Clear to Auscultation, no Crackles, no wheezes Abdomen: Bowel Sound present, Soft and mild tenderness, no hernia Skin: no redness, no Rash, no induration Extremities: no Pedal edema, no calf tenderness Neurologic: Grossly no focal neuro deficit. Bilaterally Equal motor strength  Data Reviewed: CBC: Recent Labs  Lab 09/23/17 0417 09/24/17 0506 09/25/17 0428 09/26/17 0354 09/27/17 0349 09/28/17 0423 09/28/17 1347 09/29/17 0355  WBC 4.3 3.7* 3.6* 3.3* 3.8* 10.7* 7.6 5.7  NEUTROABS 2.3 2.0 2.0 1.6* 1.9  --   --   --   HGB 9.9* 9.5* 9.9* 9.6* 9.9* 8.4* 8.6* 7.9*  HCT 30.2* 29.4* 31.1* 30.6* 30.8* 25.4* 26.8* 24.1*  MCV 104.9* 105.4* 105.4* 105.2* 104.4* 104.1* 104.7* 105.2*  PLT 143* 136* 150 152 148* 128* 128* 295*   Basic Metabolic Panel: Recent Labs  Lab 09/24/17 0506 09/26/17 0354 09/27/17 0349 09/28/17 0423 09/29/17 0355  NA 144 144 143 142 142  K 3.5 3.5 3.5 3.7 3.7  CL 117* 119* 119* 117* 117*  CO2 18* 18* 18* 18* 18*  GLUCOSE 137* 124* 130* 227* 114*  BUN 6 6 6 8 9   CREATININE 0.95 1.01* 1.12* 1.18* 1.22*  CALCIUM 8.9 9.1 8.9 8.2* 8.1*    Liver Function Tests: No results for input(s): AST, ALT, ALKPHOS, BILITOT, PROT, ALBUMIN in the last 168 hours. No results for input(s): LIPASE, AMYLASE in the last 168 hours. No results for  input(s): AMMONIA in the last 168 hours. Coagulation Profile: No results for input(s): INR, PROTIME in the last 168 hours. Cardiac Enzymes: No results for input(s): CKTOTAL, CKMB, CKMBINDEX, TROPONINI in the last 168 hours. BNP (last 3 results) No results for input(s): PROBNP in the last 8760 hours. CBG: Recent Labs  Lab 09/28/17 1659 09/28/17 2143 09/28/17 2341 09/29/17 0351 09/29/17 0748  GLUCAP 143* 149* 123* 113* 130*   Studies: No results found.  Scheduled Meds: . acetaminophen  1,000 mg Oral TID  . heparin  5,000 Units Subcutaneous Q8H  . insulin aspart  0-9 Units Subcutaneous Q4H  . methocarbamol  500 mg Oral TID   Continuous Infusions: . dextrose 5 % and 0.45 % NaCl with KCl 20 mEq/L 100 mL/hr at 09/29/17 0932  . famotidine (PEPCID) IV 20 mg (09/29/17 1103)   PRN Meds: benzocaine-menthol, fentaNYL (SUBLIMAZE) injection, ondansetron **OR** ondansetron (ZOFRAN) IV  Time spent: 35 minutes  Author: Berle Mull, MD [facility] Pager: [phone] 09/29/2017 3:06 PM  If 7PM-7AM, please contact night-coverage at [email or web address], password [facility]

## 2017-09-30 LAB — COMPREHENSIVE METABOLIC PANEL
ALT: 7 U/L — AB (ref 14–54)
AST: 13 U/L — AB (ref 15–41)
Albumin: 2.4 g/dL — ABNORMAL LOW (ref 3.5–5.0)
Alkaline Phosphatase: 69 U/L (ref 38–126)
Anion gap: 6 (ref 5–15)
BUN: 8 mg/dL (ref 6–20)
CALCIUM: 8.4 mg/dL — AB (ref 8.9–10.3)
CHLORIDE: 115 mmol/L — AB (ref 101–111)
CO2: 19 mmol/L — ABNORMAL LOW (ref 22–32)
Creatinine, Ser: 1.1 mg/dL — ABNORMAL HIGH (ref 0.44–1.00)
GFR calc non Af Amer: 43 mL/min — ABNORMAL LOW (ref >60)
GFR, EST AFRICAN AMERICAN: 50 mL/min — AB (ref >60)
Glucose, Bld: 112 mg/dL — ABNORMAL HIGH (ref 65–99)
Potassium: 3.6 mmol/L (ref 3.5–5.1)
SODIUM: 140 mmol/L (ref 135–145)
Total Bilirubin: 0.7 mg/dL (ref 0.3–1.2)
Total Protein: 5.3 g/dL — ABNORMAL LOW (ref 6.5–8.1)

## 2017-09-30 LAB — GLUCOSE, CAPILLARY
GLUCOSE-CAPILLARY: 103 mg/dL — AB (ref 65–99)
GLUCOSE-CAPILLARY: 125 mg/dL — AB (ref 65–99)
GLUCOSE-CAPILLARY: 135 mg/dL — AB (ref 65–99)
Glucose-Capillary: 115 mg/dL — ABNORMAL HIGH (ref 65–99)
Glucose-Capillary: 111 mg/dL — ABNORMAL HIGH (ref 65–99)

## 2017-09-30 LAB — CBC WITH DIFFERENTIAL/PLATELET
BASOS ABS: 0 10*3/uL (ref 0.0–0.1)
Basophils Relative: 1 %
EOS ABS: 0.2 10*3/uL (ref 0.0–0.7)
EOS PCT: 6 %
HCT: 24 % — ABNORMAL LOW (ref 36.0–46.0)
Hemoglobin: 7.9 g/dL — ABNORMAL LOW (ref 12.0–15.0)
Lymphocytes Relative: 24 %
Lymphs Abs: 0.9 10*3/uL (ref 0.7–4.0)
MCH: 34.5 pg — ABNORMAL HIGH (ref 26.0–34.0)
MCHC: 32.9 g/dL (ref 30.0–36.0)
MCV: 104.8 fL — ABNORMAL HIGH (ref 78.0–100.0)
Monocytes Absolute: 0.4 10*3/uL (ref 0.1–1.0)
Monocytes Relative: 11 %
Neutro Abs: 2.2 10*3/uL (ref 1.7–7.7)
Neutrophils Relative %: 58 %
PLATELETS: 101 10*3/uL — AB (ref 150–400)
RBC: 2.29 MIL/uL — AB (ref 3.87–5.11)
RDW: 15.3 % (ref 11.5–15.5)
WBC: 3.7 10*3/uL — AB (ref 4.0–10.5)

## 2017-09-30 MED ORDER — OXYCODONE HCL 5 MG PO TABS
5.0000 mg | ORAL_TABLET | Freq: Four times a day (QID) | ORAL | Status: DC | PRN
Start: 2017-09-30 — End: 2017-10-02
  Administered 2017-10-02: 5 mg via ORAL
  Filled 2017-09-30: qty 1

## 2017-09-30 MED ORDER — MORPHINE SULFATE (PF) 2 MG/ML IV SOLN
2.0000 mg | INTRAVENOUS | Status: DC | PRN
Start: 2017-09-30 — End: 2017-10-03
  Administered 2017-10-02: 2 mg via INTRAVENOUS
  Filled 2017-09-30 (×2): qty 1

## 2017-09-30 NOTE — Progress Notes (Signed)
[facility] Progress Note  Patient: Maria Christian SVX:[id number]   PCP: [name], DO DOB: 11/03/1924   DOA: 09/15/2017   DOS: 09/30/2017   Date of Service: the patient was seen and examined on 09/30/2017  Subjective: Pain is well controlled with the patient becomes more drowsy after receiving pain medication.  No nausea no vomiting.  Having gas in her colostomy tube.  Brief hospital course: Pt. with PMH of HTN, A. fib, chronic diastolic CHF, recurrent sigmoid volvulus; admitted on 09/15/2017, presented with complaint of abdominal pain, was found to have sigmoid volvulus.  Initially patient refused surgical correction and GI was consulted.  Underwent decompression x2 with GI without any significant improvement and therefore palliative care was consulted.  After discussion with son and palliative care patient decided to proceed with surgical correction. Currently further plan is to monitor postoperative recovery.  Assessment and Plan: 1. recurrent sigmoid volvulus. S/P decompression x2 by GI. On 09/17/2017 and 09/18/2017. Appreciate GI assistance.  Also on colonoscopy there are some areas of ulceration in the colon concerning for ischemia no malignancy. Repeat x-ray on 422 shows persistent sigmoid volvulus. Currently on clear liquid diet as well as MiraLAX. S/P colectomy as well as colostomy with Hartmann procedure. Currently on clear liquid diet.  Management per surgery.  Appreciate their input.  2.  Acute kidney injury. Prerenal etiology. Baseline serum creatinine around 1, on admission 2.1.  Getting better with hydration.  Monitor.  3.  Hypokalemia. Monitor.  Changing with IV for now.  4. ?Chronic respiratory failure with hypoxia Currently on room air, sat well On oxygen at nursing home, monitor closely  5. Chronic headache/?? Migraine Ongoing for years, worsening over the past couple of months as per daughter CT scan of the head on 2018 showed no acute  abnormalities Patient has an appointment with neurology outpatient on Oct 10, 2017, daughter  insisting patient has MRI prior to the visit currently not indicated. Continue Excedrin Migraine  6. Chronic atrial fibrillation Rate controlled  No anticoagulation likely due to risk of bleeding/age  84. Macrocytic anemia: Hgb at baseline Vit B 12 on 9/18 685, folate WNL   8. Chronic diastolic heart failure Appears to beeuvolemic Hold Lasix for now  9. Chronic thrombocytopenia Appears to be stable  10. Dementia with behavioral disturbance Seroquel at night, IV Haldol as needed  Diet: N.p.o. for surgery.  Advance per surgery after surgery. DVT Prophylaxis: subcutaneous Heparin  Advance goals of care discussion: DNR DNI  Family Communication: family was present at bedside, at the time of interview. The pt provided permission to discuss medical plan with the family. Opportunity was given to ask question and all questions were answered satisfactorily.   Disposition:  Discharge to be determined.  Consultants: gastroenterology General surgery  Palliative care  Procedures: Colonoscopy with decompression, 09/17/2017, 09/18/2017.  Antibiotics: Anti-infectives (From admission, onward)   Start     Dose/Rate Route Frequency Ordered Stop   09/28/17 0400  cefoTEtan (CEFOTAN) 2 g in sodium chloride 0.9 % 100 mL IVPB     2 g 200 mL/hr over 30 Minutes Intravenous Every 12 hours 09/27/17 1901 09/28/17 0648   09/27/17 1200  cefoTEtan (CEFOTAN) 2 g in sodium chloride 0.9 % 100 mL IVPB  Status:  Discontinued     2 g 200 mL/hr over 30 Minutes Intravenous On call to O.R. 09/27/17 0941 09/27/17 1705       Objective: Physical Exam: Vitals:   09/29/17 2330 09/29/17 2056 09/30/17 0536 09/30/17 1319  BP: 129/70 (!) 142/73 125/65 129/73  Pulse: 83 82 60 (!) 118  Resp: 15 18 20    Temp: 98.9 F (37.2 C) 97.8 F (36.6 C) 98.2 F (36.8 C) 97.8 F (36.6 C)  TempSrc: Oral Oral Oral Oral  SpO2:  100% 100% 100% 100%  Weight:      Height:        Intake/Output Summary (Last 24 hours) at 09/30/2017 1930 Last data filed at 09/30/2017 1700 Gross per 24 hour  Intake 4354.59 ml  Output 1005 ml  Net 3349.59 ml   Filed Weights   09/15/17 2200 09/16/17 1105  Weight: 71 kg (156 lb 8.4 oz) 71 kg (156 lb 8.4 oz)   General: Alert, Awake and Oriented to Person. Appear in moderate distress, affect appropriate Eyes: PERRL, Conjunctiva normal ENT: Oral Mucosa clear moist. Neck: no JVD, no Abnormal Mass Or lumps Cardiovascular: S1 and S2 Present, no Murmur, Peripheral Pulses Present Respiratory: normal respiratory effort, Bilateral Air entry equal and Decreased, no use of accessory muscle, Clear to Auscultation, no Crackles, no wheezes Abdomen: Bowel Sound present, Soft and mild tenderness, no hernia Skin: no redness, no Rash, no induration Extremities: no Pedal edema, no calf tenderness Neurologic: Grossly no focal neuro deficit. Bilaterally Equal motor strength  Data Reviewed: CBC: Recent Labs  Lab 09/24/17 0506 09/25/17 0428 09/26/17 0354 09/27/17 0349 09/28/17 0423 09/28/17 1347 09/29/17 0355 09/30/17 0840  WBC 3.7* 3.6* 3.3* 3.8* 10.7* 7.6 5.7 3.7*  NEUTROABS 2.0 2.0 1.6* 1.9  --   --   --  2.2  HGB 9.5* 9.9* 9.6* 9.9* 8.4* 8.6* 7.9* 7.9*  HCT 29.4* 31.1* 30.6* 30.8* 25.4* 26.8* 24.1* 24.0*  MCV 105.4* 105.4* 105.2* 104.4* 104.1* 104.7* 105.2* 104.8*  PLT 136* 150 152 148* 128* 128* 118* 053*   Basic Metabolic Panel: Recent Labs  Lab 09/26/17 0354 09/27/17 0349 09/28/17 0423 09/29/17 0355 09/30/17 0840  NA 144 143 142 142 140  K 3.5 3.5 3.7 3.7 3.6  CL 119* 119* 117* 117* 115*  CO2 18* 18* 18* 18* 19*  GLUCOSE 124* 130* 227* 114* 112*  BUN 6 6 8 9 8   CREATININE 1.01* 1.12* 1.18* 1.22* 1.10*  CALCIUM 9.1 8.9 8.2* 8.1* 8.4*    Liver Function Tests: Recent Labs  Lab 09/30/17 0840  AST 13*  ALT 7*  ALKPHOS 69  BILITOT 0.7  PROT 5.3*  ALBUMIN 2.4*   No  results for input(s): LIPASE, AMYLASE in the last 168 hours. No results for input(s): AMMONIA in the last 168 hours. Coagulation Profile: No results for input(s): INR, PROTIME in the last 168 hours. Cardiac Enzymes: No results for input(s): CKTOTAL, CKMB, CKMBINDEX, TROPONINI in the last 168 hours. BNP (last 3 results) No results for input(s): PROBNP in the last 8760 hours. CBG: Recent Labs  Lab 09/29/17 2352 09/30/17 0402 09/30/17 0735 09/30/17 1215 09/30/17 1651  GLUCAP 103* 115* 135* 125* 111*   Studies: No results found.  Scheduled Meds: . acetaminophen  1,000 mg Oral TID  . heparin  5,000 Units Subcutaneous Q8H  . insulin aspart  0-9 Units Subcutaneous Q4H  . methocarbamol  500 mg Oral TID   Continuous Infusions: . dextrose 5 % and 0.45 % NaCl with KCl 20 mEq/L 75 mL/hr at 09/30/17 1102  . famotidine (PEPCID) IV Stopped (09/30/17 1545)   PRN Meds: benzocaine-menthol, morphine injection, ondansetron **OR** ondansetron (ZOFRAN) IV, oxyCODONE  Time spent: 35 minutes  Author: Berle Mull, MD [facility] Pager: [phone] 09/30/2017 7:30 PM  If 7PM-7AM, please contact night-coverage at [email or web address], password [facility]

## 2017-09-30 NOTE — Progress Notes (Signed)
[facility] Office:  [phone] General Surgery Progress Note   LOS: 14 days  POD -  3 Days Post-Op  Chief Complaint: volvulus  Assessment and Plan: 1.  LAPAROSCOPIC ASSISTED  SIGMOID COLECTOMY WITH COLOSTOMY - 09/27/2017 Hassell Done  For recurrent sigmoid volvulus  Will start clear liquids, needs to get OOB  2.  A Fib 3.  Dementia 4.  Chronic diastolic heart failure 5.  Anemia  hgb - 7.9 - 09/29/2017 6.  DVT prophylaxis - SQ heparin 7.  On isolation for ESBL   Principal Problem:   Sigmoid volvulus ([place]) Active Problems:   Thrombocytopenia (HCC)   Dementia with behavioral disturbance   Normocytic anemia   Chronic atrial fibrillation (HCC)   Acute kidney injury superimposed on chronic kidney disease (HCC)   GERD (gastroesophageal reflux disease)   Protein-calorie malnutrition, severe   Subjective:  Less confused today.  Passed flatus in ostomy bag.  Daughter in room  Objective:   Vitals:   09/29/17 2056 09/30/17 0536  BP: (!) 142/73 125/65  Pulse: 82 60  Resp: 18 20  Temp: 97.8 F (36.6 C) 98.2 F (36.8 C)  SpO2: 100% 100%     Intake/Output from previous day:  04/27 0701 - 04/28 0700 In: 3538.3 [I.V.:3438.3; IV Piggyback:100] Out: 705 [Urine:700; Stool:5]  Intake/Output this shift:  No intake/output data recorded.   Physical Exam:   General: Older WF.  She wants something to eat   HEENT: Normal. Pupils equal. .   Lungs: Clear.   Abdomen: Soft   Wound: Clean.  Ostomy LLQ looks good, mild edema.   Lab Results:    Recent Labs    09/28/17 1347 09/29/17 0355  WBC 7.6 5.7  HGB 8.6* 7.9*  HCT 26.8* 24.1*  PLT 128* 118*    BMET   Recent Labs    09/28/17 0423 09/29/17 0355  NA 142 142  K 3.7 3.7  CL 117* 117*  CO2 18* 18*  GLUCOSE 227* 114*  BUN 8 9  CREATININE 1.18* 1.22*  CALCIUM 8.2* 8.1*    PT/INR  No results for input(s): LABPROT, INR in the last 72 hours.  ABG  No results for input(s): PHART, HCO3 in the last 72  hours.  Invalid input(s): PCO2, PO2   Studies/Results:  No results found.   Anti-infectives:   Anti-infectives (From admission, onward)   Start     Dose/Rate Route Frequency Ordered Stop   09/28/17 0400  cefoTEtan (CEFOTAN) 2 g in sodium chloride 0.9 % 100 mL IVPB     2 g 200 mL/hr over 30 Minutes Intravenous Every 12 hours 09/27/17 1901 09/28/17 0648   09/27/17 1200  cefoTEtan (CEFOTAN) 2 g in sodium chloride 0.9 % 100 mL IVPB  Status:  Discontinued     2 g 200 mL/hr over 30 Minutes Intravenous On call to O.R. 09/27/17 0941 09/27/17 1705      Alphonsa Overall, MD, FACS Pager: [facility] Office: [phone] 09/30/2017

## 2017-10-01 LAB — CBC WITH DIFFERENTIAL/PLATELET
Basophils Absolute: 0 10*3/uL (ref 0.0–0.1)
Basophils Relative: 1 %
EOS ABS: 0.3 10*3/uL (ref 0.0–0.7)
Eosinophils Relative: 9 %
HEMATOCRIT: 25.9 % — AB (ref 36.0–46.0)
HEMOGLOBIN: 8.4 g/dL — AB (ref 12.0–15.0)
Lymphocytes Relative: 26 %
Lymphs Abs: 0.9 10*3/uL (ref 0.7–4.0)
MCH: 33.9 pg (ref 26.0–34.0)
MCHC: 32.4 g/dL (ref 30.0–36.0)
MCV: 104.4 fL — ABNORMAL HIGH (ref 78.0–100.0)
Monocytes Absolute: 0.4 10*3/uL (ref 0.1–1.0)
Monocytes Relative: 10 %
NEUTROS PCT: 54 %
Neutro Abs: 1.9 10*3/uL (ref 1.7–7.7)
Platelets: 131 10*3/uL — ABNORMAL LOW (ref 150–400)
RBC: 2.48 MIL/uL — ABNORMAL LOW (ref 3.87–5.11)
RDW: 15.4 % (ref 11.5–15.5)
WBC: 3.5 10*3/uL — ABNORMAL LOW (ref 4.0–10.5)

## 2017-10-01 LAB — GLUCOSE, CAPILLARY
GLUCOSE-CAPILLARY: 137 mg/dL — AB (ref 65–99)
GLUCOSE-CAPILLARY: 160 mg/dL — AB (ref 65–99)
Glucose-Capillary: 92 mg/dL (ref 65–99)
Glucose-Capillary: 118 mg/dL — ABNORMAL HIGH (ref 65–99)
Glucose-Capillary: 132 mg/dL — ABNORMAL HIGH (ref 65–99)
Glucose-Capillary: 187 mg/dL — ABNORMAL HIGH (ref 65–99)

## 2017-10-01 LAB — BASIC METABOLIC PANEL
Anion gap: 4 — ABNORMAL LOW (ref 5–15)
BUN: 6 mg/dL (ref 6–20)
CHLORIDE: 115 mmol/L — AB (ref 101–111)
CO2: 18 mmol/L — ABNORMAL LOW (ref 22–32)
CREATININE: 1.01 mg/dL — AB (ref 0.44–1.00)
Calcium: 8.3 mg/dL — ABNORMAL LOW (ref 8.9–10.3)
GFR calc non Af Amer: 48 mL/min — ABNORMAL LOW (ref >60)
GFR, EST AFRICAN AMERICAN: 55 mL/min — AB (ref >60)
GLUCOSE: 128 mg/dL — AB (ref 65–99)
Potassium: 4 mmol/L (ref 3.5–5.1)
Sodium: 137 mmol/L (ref 135–145)

## 2017-10-01 MED ORDER — BOOST / RESOURCE BREEZE PO LIQD CUSTOM
1.0000 | Freq: Two times a day (BID) | ORAL | Status: DC
  Administered 2017-10-01 – 2017-10-06 (×8): 1 via ORAL

## 2017-10-01 MED ORDER — LORAZEPAM 2 MG/ML IJ SOLN
0.5000 mg | Freq: Once | INTRAMUSCULAR | Status: AC
  Administered 2017-10-01: 0.5 mg via INTRAVENOUS
  Filled 2017-10-01: qty 1

## 2017-10-01 MED ORDER — ENOXAPARIN SODIUM 40 MG/0.4ML ~~LOC~~ SOLN
40.0000 mg | SUBCUTANEOUS | Status: DC
  Administered 2017-10-01 – 2017-10-06 (×6): 40 mg via SUBCUTANEOUS
  Filled 2017-10-01 (×6): qty 0.4

## 2017-10-01 MED ORDER — DOCUSATE SODIUM 100 MG PO CAPS
100.0000 mg | ORAL_CAPSULE | Freq: Two times a day (BID) | ORAL | Status: DC
  Administered 2017-10-01 – 2017-10-06 (×7): 100 mg via ORAL
  Filled 2017-10-01 (×10): qty 1

## 2017-10-01 NOTE — Care Management Important Message (Signed)
Important Message  Patient Details  Name: Maria Christian MRN: [id number] Date of Birth: 27-Aug-1924   Medicare Important Message Given:  Yes    Kerin Salen 10/01/2017, 11:06 AMImportant Message  Patient Details  Name: Maria Christian MRN: [id number] Date of Birth: 1925-03-25   Medicare Important Message Given:  Yes    Kerin Salen 10/01/2017, 11:06 AM

## 2017-10-01 NOTE — Progress Notes (Signed)
[facility] Progress Note  Patient: Maria Christian VEL:[id number]   PCP: [name], DO DOB: [birth year]   DOA: 09/15/2017   DOS: 10/01/2017   Date of Service: the patient was seen and examined on 10/01/2017  Subjective: Patient drowsy and lethargic.  No nausea no vomiting.  Brief hospital course: Pt. with PMH of HTN, A. fib, chronic diastolic CHF, recurrent sigmoid volvulus; admitted on 09/15/2017, presented with complaint of abdominal pain, was found to have sigmoid volvulus.  Initially patient refused surgical correction and GI was consulted.  Underwent decompression x2 with GI without any significant improvement and therefore palliative care was consulted.  After discussion with son and palliative care patient decided to proceed with surgical correction. Currently further plan is to monitor postoperative recovery.  Assessment and Plan: 1. recurrent sigmoid volvulus. S/P decompression x2 by GI. On 09/17/2017 and 09/18/2017. Appreciate GI assistance.  Also on colonoscopy there are some areas of ulceration in the colon concerning for ischemia no malignancy. Repeat x-ray on 422 shows persistent sigmoid volvulus. Currently on clear liquid diet as well as MiraLAX. S/P colectomy as well as colostomy with Hartmann procedure. Currently on clear liquid diet.  Management per surgery.  Appreciate their input.  2.  Acute kidney injury. Prerenal etiology. Baseline serum creatinine around 1, on admission 2.1.  Getting better with hydration.  Monitor.  3.  Hypokalemia. Monitor.  Changing with IV for now.  4. ?Chronic respiratory failure with hypoxia Currently on room air, sat well On oxygen at nursing home, monitor closely  5. Chronic headache/?? Migraine Ongoing for years, worsening over the past couple of months as per daughter CT scan of the head on 2018 showed no acute abnormalities Patient has an appointment with neurology outpatient on Oct 10, 2017, daughter  insisting patient has MRI  prior to the visit currently not indicated. Continue Excedrin Migraine  6. Chronic atrial fibrillation Rate controlled  No anticoagulation likely due to risk of bleeding/age  61. Macrocytic anemia: Hgb at baseline Vit B 12 on 9/18 685, folate WNL   8. Chronic diastolic heart failure Appears to beeuvolemic Hold Lasix for now  9. Chronic thrombocytopenia Appears to be stable  10. Dementia with behavioral disturbance Seroquel at night, IV Haldol as needed  Diet: N.p.o. for surgery.  Advance per surgery after surgery. DVT Prophylaxis: subcutaneous Heparin  Advance goals of care discussion: DNR DNI  Family Communication: family was present at bedside, at the time of interview. The pt provided permission to discuss medical plan with the family. Opportunity was given to ask question and all questions were answered satisfactorily.   Disposition:  Discharge to be determined.  Consultants: gastroenterology General surgery  Palliative care  Procedures: Colonoscopy with decompression, 09/17/2017, 09/18/2017.  Antibiotics: Anti-infectives (From admission, onward)   Start     Dose/Rate Route Frequency Ordered Stop   09/28/17 0400  cefoTEtan (CEFOTAN) 2 g in sodium chloride 0.9 % 100 mL IVPB     2 g 200 mL/hr over 30 Minutes Intravenous Every 12 hours 09/27/17 1901 09/28/17 0648   09/27/17 1200  cefoTEtan (CEFOTAN) 2 g in sodium chloride 0.9 % 100 mL IVPB  Status:  Discontinued     2 g 200 mL/hr over 30 Minutes Intravenous On call to O.R. 09/27/17 0941 09/27/17 1705       Objective: Physical Exam: Vitals:   09/29/17 2056 09/30/17 0536 09/30/17 1319 09/30/17 2105  BP: (!) 142/73 125/65 129/73 (!) 144/62  Pulse: 82 60 (!) 118 82  Resp: 18  20  18  Temp: 97.8 F (36.6 C) 98.2 F (36.8 C) 97.8 F (36.6 C) 98.2 F (36.8 C)  TempSrc: Oral Oral Oral Oral  SpO2: 100% 100% 100% 100%  Weight:      Height:        Intake/Output Summary (Last 24 hours) at 10/01/2017 1756 Last  data filed at 10/01/2017 1500 Gross per 24 hour  Intake 2200 ml  Output 1555 ml  Net 645 ml   Filed Weights   09/15/17 2200 09/16/17 1105  Weight: 71 kg (156 lb 8.4 oz) 71 kg (156 lb 8.4 oz)    Data Reviewed: CBC: Recent Labs  Lab 09/25/17 0428 09/26/17 0354 09/27/17 0349 09/28/17 0423 09/28/17 1347 09/29/17 0355 09/30/17 0840 10/01/17 0724  WBC 3.6* 3.3* 3.8* 10.7* 7.6 5.7 3.7* 3.5*  NEUTROABS 2.0 1.6* 1.9  --   --   --  2.2 1.9  HGB 9.9* 9.6* 9.9* 8.4* 8.6* 7.9* 7.9* 8.4*  HCT 31.1* 30.6* 30.8* 25.4* 26.8* 24.1* 24.0* 25.9*  MCV 105.4* 105.2* 104.4* 104.1* 104.7* 105.2* 104.8* 104.4*  PLT 150 152 148* 128* 128* 118* 101* 101*   Basic Metabolic Panel: Recent Labs  Lab 09/27/17 0349 09/28/17 0423 09/29/17 0355 09/30/17 0840 10/01/17 0724  NA 143 142 142 140 137  K 3.5 3.7 3.7 3.6 4.0  CL 119* 117* 117* 115* 115*  CO2 18* 18* 18* 19* 18*  GLUCOSE 130* 227* 114* 112* 128*  BUN 6 8 9 8 6   CREATININE 1.12* 1.18* 1.22* 1.10* 1.01*  CALCIUM 8.9 8.2* 8.1* 8.4* 8.3*    Liver Function Tests: Recent Labs  Lab 09/30/17 0840  AST 13*  ALT 7*  ALKPHOS 69  BILITOT 0.7  PROT 5.3*  ALBUMIN 2.4*   No results for input(s): LIPASE, AMYLASE in the last 168 hours. No results for input(s): AMMONIA in the last 168 hours. Coagulation Profile: No results for input(s): INR, PROTIME in the last 168 hours. Cardiac Enzymes: No results for input(s): CKTOTAL, CKMB, CKMBINDEX, TROPONINI in the last 168 hours. BNP (last 3 results) No results for input(s): PROBNP in the last 8760 hours. CBG: Recent Labs  Lab 10/01/17 0031 10/01/17 0337 10/01/17 0729 10/01/17 1151 10/01/17 1614  GLUCAP 160* 92 118* 132* 187*   Studies: No results found.  Scheduled Meds: . acetaminophen  1,000 mg Oral TID  . docusate sodium  100 mg Oral BID  . enoxaparin (LOVENOX) injection  40 mg Subcutaneous Q24H  . feeding supplement  1 Container Oral BID BM  . insulin aspart  0-9 Units Subcutaneous  Q4H  . methocarbamol  500 mg Oral TID   Continuous Infusions: . dextrose 5 % and 0.45 % NaCl with KCl 20 mEq/L 75 mL/hr at 10/01/17 0426  . famotidine (PEPCID) IV Stopped (10/01/17 1101)   PRN Meds: benzocaine-menthol, morphine injection, ondansetron **OR** ondansetron (ZOFRAN) IV, oxyCODONE  Time spent: 15 minutes  Author: Berle Mull, MD [facility] Pager: [phone] 10/01/2017 5:56 PM  If 7PM-7AM, please contact night-coverage at [email or web address], password [facility]

## 2017-10-01 NOTE — Progress Notes (Signed)
[facility] Progress Note  4 Days Post-Op  Subjective: CC-  Daughter at bedside. States that Maria Christian did great yesterday. She had no abdominal pain, nausea, or vomiting, and she tolerated clear liquids (broth, water, coke, icee). She has had some liquid and air from colostomy. Sat in the chair >1 hour yesterday.  Last night she became very confused and required a dose of ativan. Sleeping this morning.  Objective: Vital signs in last 24 hours: Temp:  [97.8 F (36.6 C)-98.2 F (36.8 C)] 98.2 F (36.8 C) (04/28 2105) Pulse Rate:  [82-118] 82 (04/28 2105) Resp:  [18] 18 (04/28 2105) BP: (129-144)/(62-73) 144/62 (04/28 2105) SpO2:  [100 %] 100 % (04/28 2105) Last BM Date: 09/27/17  Intake/Output from previous day: 04/28 0701 - 04/29 0700 In: 3087.9 [P.O.:1200; I.V.:1787.9; IV Piggyback:100] Out: 1705 [Urine:1650; Stool:55] Intake/Output this shift: No intake/output data recorded.  PE: Gen:  NAD, opens eyes to questions but is very drowsy HEENT: EOM's intact, pupils equal and round Card:  Irregularly irregular Pulm:  CTAB, no W/R/R, effort normal Abd: Soft, mild distension, nontender, + BS, midline incision C/D/I with honeycomb dressing in place, ostomy pink with air in bag  Lab Results:  Recent Labs    09/30/17 0840 10/01/17 0724  WBC 3.7* 3.5*  HGB 7.9* 8.4*  HCT 24.0* 25.9*  PLT 101* 131*   BMET Recent Labs    09/30/17 0840 10/01/17 0724  NA 140 137  K 3.6 4.0  CL 115* 115*  CO2 19* 18*  GLUCOSE 112* 128*  BUN 8 6  CREATININE 1.10* 1.01*  CALCIUM 8.4* 8.3*   PT/INR No results for input(s): LABPROT, INR in the last 72 hours. CMP     Component Value Date/Time   NA 137 10/01/2017 0724   NA 144 07/10/2017   K 4.0 10/01/2017 0724   CL 115 (H) 10/01/2017 0724   CO2 18 (L) 10/01/2017 0724   GLUCOSE 128 (H) 10/01/2017 0724   BUN 6 10/01/2017 0724   BUN 11 07/10/2017   CREATININE 1.01 (H) 10/01/2017 0724   CALCIUM 8.3 (L) 10/01/2017 0724    PROT 5.3 (L) 09/30/2017 0840   ALBUMIN 2.4 (L) 09/30/2017 0840   AST 13 (L) 09/30/2017 0840   ALT 7 (L) 09/30/2017 0840   ALKPHOS 69 09/30/2017 0840   BILITOT 0.7 09/30/2017 0840   GFRNONAA 48 (L) 10/01/2017 0724   GFRAA 55 (L) 10/01/2017 0724   Lipase     Component Value Date/Time   LIPASE 24 05/09/2017 1705       Studies/Results: No results found.  Anti-infectives: Anti-infectives (From admission, onward)   Start     Dose/Rate Route Frequency Ordered Stop   09/28/17 0400  cefoTEtan (CEFOTAN) 2 g in sodium chloride 0.9 % 100 mL IVPB     2 g 200 mL/hr over 30 Minutes Intravenous Every 12 hours 09/27/17 1901 09/28/17 0648   09/27/17 1200  cefoTEtan (CEFOTAN) 2 g in sodium chloride 0.9 % 100 mL IVPB  Status:  Discontinued     2 g 200 mL/hr over 30 Minutes Intravenous On call to O.R. 09/27/17 0941 09/27/17 1705       Assessment/Plan Atrial fibrillation - not on anticoagulants Dementia CKD Chronic diastolic heart failure DNR/DNI Anemia - Hg 8.4, stable AKI - Cr 1.01, improving  Recurrent sigmoid volvulus S/p sigmoid colectomy with Hartmann procedure and end colostomy 4/25 Dr. Hassell Done - POD 4 - path pending - air in colostomy bag, liquid output but no stool,  tolerating clears  ID - cefotetan perioperative FEN - IVF, full liquids, Boost Breeze, colace VTE - SCDs, lovenox Foley - wick Follow up - Dr. Hassell Done  Plan - Advance to full liquids. Continue PT/OOB.   LOS: 15 days    [name] , [facility] 10/01/2017, 8:09 AM Pager: [phone] Consults: [phone] Mon-Fri 7:00 am-4:30 pm Sat-Sun 7:00 am-11:30 am

## 2017-10-01 NOTE — Progress Notes (Signed)
Physical Therapy Treatment Patient Details Name: Maria Christian MRN: [id number] DOB: [birth year] Today's Date: 10/01/2017    History of Present Illness 84 yo female admitted with sigmoid volvulus. Hx of Afib, chf, Parkinson's disease, dementia, neuropathy, CKD, DM.  Pt s/p LAPAROSCOPIC ASSISTED  SIGMOID COLECTOMY WITH COLOSTOMY on 09/27/2017     PT Comments    Pt up in recliner, nursing assisted OOB with hoyer lift.  Pt reports her caregivers assist her OOB with hoyer lift at baseline; she reports she does not stand or ambulate.  Pt very agreeable to exercises so performed exercises in recliner.  Pt also encouraged to eat her lunch (had a few bites before exercises, and tray placed back in front of her after session however she reported she wanted chicken and chips; she did drink some breeze with encouragement).   Follow Up Recommendations  SNF(rehab per SNF (pt resident))     Equipment Recommendations  None recommended by PT    Recommendations for Other Services       Precautions / Restrictions Precautions Precautions: Fall Precaution Comments: L UQ colostomy    Mobility  Bed Mobility               General bed mobility comments: pt up in recliner, nursing used hoyer lift for OOB  Transfers                 General transfer comment: NT - hoyer lift for OOB at baseline  Ambulation/Gait                 Stairs             Wheelchair Mobility    Modified Rankin (Stroke Patients Only)       Balance                                            Cognition Arousal/Alertness: Awake/alert Behavior During Therapy: WFL for tasks assessed/performed Overall Cognitive Status: Within Functional Limits for tasks assessed                                        Exercises General Exercises - Upper Extremity Shoulder Flexion: AROM;10 reps;Both General Exercises - Lower Extremity Ankle Circles/Pumps: AROM;Both;10  reps Quad Sets: AROM;Both;10 reps Short Arc Quad: AROM;10 reps;Both Heel Slides: AAROM;10 reps;Both Hip ABduction/ADduction: Both;AAROM;10 reps Straight Leg Raises: AAROM;10 reps;Both Other Exercises Other Exercises: also performed bil forward reaching for target at midline x10    General Comments        Pertinent Vitals/Pain Pain Assessment: Faces Faces Pain Scale: Hurts a little bit Pain Location: colostomy site Pain Descriptors / Indicators: Operative site guarding Pain Intervention(s): Monitored during session    Home Living                      Prior Function            PT Goals (current goals can now be found in the care plan section) Progress towards PT goals: Progressing toward goals    Frequency    Min 1X/week      PT Plan Frequency needs to be updated    Co-evaluation              AM-PAC PT "6 Clicks" Daily Activity  Outcome Measure  Difficulty turning over in bed (including adjusting bedclothes, sheets and blankets)?: Unable Difficulty moving from lying on back to sitting on the side of the bed? : Unable Difficulty sitting down on and standing up from a chair with arms (e.g., wheelchair, bedside commode, etc,.)?: Unable Help needed moving to and from a bed to chair (including a wheelchair)?: Total Help needed walking in hospital room?: Total Help needed climbing 3-5 steps with a railing? : Total 6 Click Score: 6    End of Session   Activity Tolerance: Patient tolerated treatment well Patient left: in bed;with call bell/phone within reach;with bed alarm set   PT Visit Diagnosis: Muscle weakness (generalized) (M62.81)     Time: 2197-5883 PT Time Calculation (min) (ACUTE ONLY): 27 min  Charges:  $Therapeutic Exercise: 8-22 mins                    G Codes:       Carmelia Bake, PT, DPT 10/01/2017 Pager: [phone]  York Ram E 10/01/2017, 3:54 PM

## 2017-10-01 NOTE — Progress Notes (Signed)
Brief Pharmacy note: Lovenox for VTE prophylaxis  81 yoF s/p sigmoid colectomy, Hartmann procedure, end colostomy 4/25  PMH: Afib-no anti-coag, sigmoid volvulus  Heparin SQ 5000 units q8h post-op  Plan: change to Lovenox per attending.  Lovenox 40mg  SQ q24, begin 8 hr after last Heparin SQ Will sign off protocol, adjust dose if necessary  Minda Ditto PharmD Pager [phone] 10/01/2017, 10:10 AM

## 2017-10-01 NOTE — Anesthesia Postprocedure Evaluation (Signed)
Anesthesia Post Note  Patient: Maria Christian  Procedure(s) Performed: LAPAROSCOPIC ASSISTED  SIGMOID COLECTOMY WITH COLOSTOMY (N/A Abdomen)     Patient location during evaluation: PACU Anesthesia Type: General Level of consciousness: awake and alert Pain management: pain level controlled Vital Signs Assessment: post-procedure vital signs reviewed and stable Respiratory status: spontaneous breathing, nonlabored ventilation, respiratory function stable and patient connected to nasal cannula oxygen Cardiovascular status: blood pressure returned to baseline and stable Postop Assessment: no apparent nausea or vomiting Anesthetic complications: no    Last Vitals:  Vitals:   09/30/17 1319 09/30/17 2105  BP: 129/73 (!) 144/62  Pulse: (!) 118 82  Resp:  18  Temp: 36.6 C 36.8 C  SpO2: 100% 100%    Last Pain:  Vitals:   09/30/17 2325  TempSrc:   PainSc: 0-No pain                 [name]

## 2017-10-02 LAB — GLUCOSE, CAPILLARY
GLUCOSE-CAPILLARY: 136 mg/dL — AB (ref 65–99)
Glucose-Capillary: 143 mg/dL — ABNORMAL HIGH (ref 65–99)
Glucose-Capillary: 167 mg/dL — ABNORMAL HIGH (ref 65–99)
Glucose-Capillary: 130 mg/dL — ABNORMAL HIGH (ref 65–99)
Glucose-Capillary: 136 mg/dL — ABNORMAL HIGH (ref 65–99)

## 2017-10-02 LAB — CBC WITH DIFFERENTIAL/PLATELET
Basophils Absolute: 0 10*3/uL (ref 0.0–0.1)
Basophils Relative: 1 %
EOS PCT: 7 %
Eosinophils Absolute: 0.3 10*3/uL (ref 0.0–0.7)
HCT: 27.3 % — ABNORMAL LOW (ref 36.0–46.0)
Hemoglobin: 8.9 g/dL — ABNORMAL LOW (ref 12.0–15.0)
LYMPHS ABS: 0.9 10*3/uL (ref 0.7–4.0)
LYMPHS PCT: 24 %
MCH: 33.8 pg (ref 26.0–34.0)
MCHC: 32.6 g/dL (ref 30.0–36.0)
MCV: 103.8 fL — ABNORMAL HIGH (ref 78.0–100.0)
MONOS PCT: 12 %
Monocytes Absolute: 0.4 10*3/uL (ref 0.1–1.0)
Neutro Abs: 2.1 10*3/uL (ref 1.7–7.7)
Neutrophils Relative %: 56 %
PLATELETS: 136 10*3/uL — AB (ref 150–400)
RBC: 2.63 MIL/uL — AB (ref 3.87–5.11)
RDW: 15.3 % (ref 11.5–15.5)
WBC: 3.8 10*3/uL — ABNORMAL LOW (ref 4.0–10.5)

## 2017-10-02 LAB — BASIC METABOLIC PANEL
Anion gap: 5 (ref 5–15)
BUN: 5 mg/dL — AB (ref 6–20)
CO2: 20 mmol/L — AB (ref 22–32)
Calcium: 8.6 mg/dL — ABNORMAL LOW (ref 8.9–10.3)
Chloride: 117 mmol/L — ABNORMAL HIGH (ref 101–111)
Creatinine, Ser: 0.88 mg/dL (ref 0.44–1.00)
GFR calc Af Amer: >60 mL/min (ref >60)
GFR calc non Af Amer: 56 mL/min — ABNORMAL LOW (ref >60)
GLUCOSE: 172 mg/dL — AB (ref 65–99)
POTASSIUM: 3.8 mmol/L (ref 3.5–5.1)
Sodium: 142 mmol/L (ref 135–145)

## 2017-10-02 MED ORDER — POLYETHYLENE GLYCOL 3350 17 G PO PACK
17.0000 g | PACK | Freq: Every day | ORAL | Status: DC
  Administered 2017-10-03: 17 g via ORAL
  Filled 2017-10-02 (×2): qty 1

## 2017-10-02 MED ORDER — FAMOTIDINE 20 MG PO TABS
20.0000 mg | ORAL_TABLET | Freq: Two times a day (BID) | ORAL | Status: DC
  Administered 2017-10-02 – 2017-10-06 (×9): 20 mg via ORAL
  Filled 2017-10-02 (×11): qty 1

## 2017-10-02 MED ORDER — METHOCARBAMOL 500 MG PO TABS: 500.0000 mg | ORAL_TABLET | Freq: Three times a day (TID) | ORAL | Status: DC | PRN

## 2017-10-02 MED ORDER — QUETIAPINE FUMARATE 25 MG PO TABS: 12.5000 mg | ORAL_TABLET | Freq: Every day | ORAL | Status: DC

## 2017-10-02 MED ORDER — ACETAMINOPHEN 500 MG PO TABS
1000.0000 mg | ORAL_TABLET | Freq: Three times a day (TID) | ORAL | Status: DC | PRN
  Administered 2017-10-02 – 2017-10-06 (×4): 1000 mg via ORAL
  Filled 2017-10-02 (×4): qty 2

## 2017-10-02 MED ORDER — ZOLPIDEM TARTRATE 5 MG PO TABS
5.0000 mg | ORAL_TABLET | Freq: Every evening | ORAL | Status: DC | PRN
  Administered 2017-10-02 – 2017-10-04 (×2): 5 mg via ORAL
  Filled 2017-10-02 (×2): qty 1

## 2017-10-02 MED ORDER — OXYCODONE HCL 5 MG PO TABS: 5.0000 mg | ORAL_TABLET | Freq: Four times a day (QID) | ORAL | Status: DC | PRN

## 2017-10-02 NOTE — Progress Notes (Signed)
[facility] Progress Note  Patient: Maria Christian ZTI:[id number]   PCP: [name], DO DOB: [birth year]   DOA: 09/15/2017   DOS: 10/02/2017   Date of Service: the patient was seen and examined on 10/02/2017  Subjective: Awake, sitting in the chair.  Having gas in the colostomy bag.  No nausea no vomiting.  Was agitated last night.  No fever no chills.  Brief hospital course: Pt. with PMH of HTN, A. fib, chronic diastolic CHF, recurrent sigmoid volvulus; admitted on 09/15/2017, presented with complaint of abdominal pain, was found to have sigmoid volvulus.  Initially patient refused surgical correction and GI was consulted.  Underwent decompression x2 with GI without any significant improvement and therefore palliative care was consulted.  After discussion with son and palliative care patient decided to proceed with surgical correction. Currently further plan is to monitor postoperative recovery.  Assessment and Plan: 1. recurrent sigmoid volvulus. S/P decompression x2 by GI. On 09/17/2017 and 09/18/2017. Appreciate GI assistance.  Also on colonoscopy there are some areas of ulceration in the colon concerning for ischemia no malignancy. Repeat x-ray on 422 shows persistent sigmoid volvulus. S/P colectomy as well as colostomy with Hartmann procedure. Currently on full liquid diet.  Management per surgery.  Appreciate their input. Expect slow advancement.  2.  Acute kidney injury. Prerenal etiology. Baseline serum creatinine around 1, on admission 2.1.  Getting better with hydration.  Monitor.  3.  Hypokalemia. Monitor.  Changing with IV for now.  4. Chronic respiratory failure with hypoxia On oxygen at nursing home, monitor closely  5. Chronic headache/?? Migraine Ongoing for years, worsening over the past couple of months as per daughter CT scan of the head on 2018 showed no acute abnormalities Patient has an appointment with neurology outpatient on Oct 10, 2017, family requested  patient has MRI prior to the visit, I have recommended it is currently not indicated. Continue to monitor  6. Chronic atrial fibrillation Rate controlled  No anticoagulation likely due to risk of bleeding/age  82. Macrocytic anemia: Hgb at baseline Vit B 12 on 9/18 685, folate WNL   8. Chronic diastolic heart failure Appears to beeuvolemic Hold Lasix for now actually receiving IV fluids.  9. Chronic thrombocytopenia Appears to be stable  10. Dementia with behavioral disturbance PRN Haldol, PRN Ambien. May require Seroquel.  Diet: N.p.o. for surgery.  Advance per surgery after surgery. DVT Prophylaxis: subcutaneous Heparin  Advance goals of care discussion: DNR DNI  Family Communication: family was present at bedside, at the time of interview. The pt provided permission to discuss medical plan with the family. Opportunity was given to ask question and all questions were answered satisfactorily.   Disposition:  Discharge to SNF once the diet is advanced sufficiently.  Cleared by General surgery   Consultants: gastroenterology General surgery  Palliative care  Procedures: Colonoscopy with decompression, 09/17/2017, 09/18/2017.  Antibiotics: Anti-infectives (From admission, onward)   Start     Dose/Rate Route Frequency Ordered Stop   09/28/17 0400  cefoTEtan (CEFOTAN) 2 g in sodium chloride 0.9 % 100 mL IVPB     2 g 200 mL/hr over 30 Minutes Intravenous Every 12 hours 09/27/17 1901 09/28/17 0648   09/27/17 1200  cefoTEtan (CEFOTAN) 2 g in sodium chloride 0.9 % 100 mL IVPB  Status:  Discontinued     2 g 200 mL/hr over 30 Minutes Intravenous On call to O.R. 09/27/17 0941 09/27/17 1705       Objective: Physical Exam: Vitals:   09/30/17  2105 10/01/17 2110 10/02/17 0556 10/02/17 1330  BP: (!) 144/62 (!) 164/75 (!) 148/69 (!) 133/110  Pulse: 82 85 85 86  Resp: 18 18 20 18   Temp: 98.2 F (36.8 C)  97.7 F (36.5 C) 98.3 F (36.8 C)  TempSrc: Oral  Oral Oral  SpO2:  100% 100% 100% 100%  Weight:      Height:        Intake/Output Summary (Last 24 hours) at 10/02/2017 1612 Last data filed at 10/02/2017 1330 Gross per 24 hour  Intake 1895 ml  Output 2100 ml  Net -205 ml   Filed Weights   09/15/17 2200 09/16/17 1105  Weight: 71 kg (156 lb 8.4 oz) 71 kg (156 lb 8.4 oz)    Data Reviewed: CBC: Recent Labs  Lab 09/26/17 0354 09/27/17 0349  09/28/17 1347 09/29/17 0355 09/30/17 0840 10/01/17 0724 10/02/17 0424  WBC 3.3* 3.8*   < > 7.6 5.7 3.7* 3.5* 3.8*  NEUTROABS 1.6* 1.9  --   --   --  2.2 1.9 2.1  HGB 9.6* 9.9*   < > 8.6* 7.9* 7.9* 8.4* 8.9*  HCT 30.6* 30.8*   < > 26.8* 24.1* 24.0* 25.9* 27.3*  MCV 105.2* 104.4*   < > 104.7* 105.2* 104.8* 104.4* 103.8*  PLT 152 148*   < > 128* 118* 101* 131* 136*   < > = values in this interval not displayed.   Basic Metabolic Panel: Recent Labs  Lab 09/28/17 0423 09/29/17 0355 09/30/17 0840 10/01/17 0724 10/02/17 0424  NA 142 142 140 137 142  K 3.7 3.7 3.6 4.0 3.8  CL 117* 117* 115* 115* 117*  CO2 18* 18* 19* 18* 20*  GLUCOSE 227* 114* 112* 128* 172*  BUN 8 9 8 6  5*  CREATININE 1.18* 1.22* 1.10* 1.01* 0.88  CALCIUM 8.2* 8.1* 8.4* 8.3* 8.6*    Liver Function Tests: Recent Labs  Lab 09/30/17 0840  AST 13*  ALT 7*  ALKPHOS 69  BILITOT 0.7  PROT 5.3*  ALBUMIN 2.4*   No results for input(s): LIPASE, AMYLASE in the last 168 hours. No results for input(s): AMMONIA in the last 168 hours. Coagulation Profile: No results for input(s): INR, PROTIME in the last 168 hours. Cardiac Enzymes: No results for input(s): CKTOTAL, CKMB, CKMBINDEX, TROPONINI in the last 168 hours. BNP (last 3 results) No results for input(s): PROBNP in the last 8760 hours. CBG: Recent Labs  Lab 10/01/17 1614 10/01/17 2028 10/02/17 0259 10/02/17 0904 10/02/17 1158  GLUCAP 187* 137* 143* 136* 167*   Studies: No results found.  Scheduled Meds: . docusate sodium  100 mg Oral BID  . enoxaparin (LOVENOX)  injection  40 mg Subcutaneous Q24H  . famotidine  20 mg Oral BID  . feeding supplement  1 Container Oral BID BM  . insulin aspart  0-9 Units Subcutaneous Q4H  . polyethylene glycol  17 g Oral Daily   Continuous Infusions: . dextrose 5 % and 0.45 % NaCl with KCl 20 mEq/L 50 mL/hr at 10/02/17 1159   PRN Meds: acetaminophen, benzocaine-menthol, methocarbamol, morphine injection, ondansetron **OR** ondansetron (ZOFRAN) IV, oxyCODONE, zolpidem  Time spent: 35 minutes  Author: Berle Mull, MD [facility] Pager: [phone] 10/02/2017 4:12 PM  If 7PM-7AM, please contact night-coverage at [email or web address], password [facility]

## 2017-10-02 NOTE — Progress Notes (Signed)
CSW following to assist with disposition as pt admitted from facility- long term care resident at [facility] and plan is to return at [place]. Updated facility on pt's status. Facility ordering ostomy supplies preparing for pt's return.  Sharren Bridge, MSW, LCSW Clinical Social Work 09/27/2017 [phone]

## 2017-10-02 NOTE — Progress Notes (Addendum)
[facility] Progress Note  5 Days Post-Op  Subjective: CC-  Son at bedside. Patient states that her abdomen is very sore this morning. Denies any n/v or bloating. Tolerating full liquids; son states that she ate 2 bowls of grits and 2 Boost yesterday. She has not had any stool from ostomy over the last 24 hours, only liquid.  She did get OOB to chair again yesterday.  PT recommending SNF.  Objective: Vital signs in last 24 hours: Temp:  [97.7 F (36.5 C)] 97.7 F (36.5 C) (04/30 0556) Pulse Rate:  [85] 85 (04/30 0556) Resp:  [18-20] 20 (04/30 0556) BP: (148-164)/(69-75) 148/69 (04/30 0556) SpO2:  [100 %] 100 % (04/30 0556) Last BM Date: 09/27/17  Intake/Output from previous day: 04/29 0701 - 04/30 0700 In: 2455 [P.O.:480; I.V.:1875; IV Piggyback:100] Out: 2650 [Urine:2650] Intake/Output this shift: No intake/output data recorded.  PE: Gen: Alert, NAD HEENT: EOM's intact, pupils equal and round Card:Irregularly irregular Pulm:CTAB, no W/R/R, effort normal Abd: Soft,mild distension, diffuse mild tenderness, +BS,midlineincision C/D/I with staples intact and no erythema or drainage, ostomy pink/edematous with air and trace serosanguinous fluid in bag  Lab Results:  Recent Labs    10/01/17 0724 10/02/17 0424  WBC 3.5* 3.8*  HGB 8.4* 8.9*  HCT 25.9* 27.3*  PLT 131* 136*   BMET Recent Labs    10/01/17 0724 10/02/17 0424  NA 137 142  K 4.0 3.8  CL 115* 117*  CO2 18* 20*  GLUCOSE 128* 172*  BUN 6 5*  CREATININE 1.01* 0.88  CALCIUM 8.3* 8.6*   PT/INR No results for input(s): LABPROT, INR in the last 72 hours. CMP     Component Value Date/Time   NA 142 10/02/2017 0424   NA 144 07/10/2017   K 3.8 10/02/2017 0424   CL 117 (H) 10/02/2017 0424   CO2 20 (L) 10/02/2017 0424   GLUCOSE 172 (H) 10/02/2017 0424   BUN 5 (L) 10/02/2017 0424   BUN 11 07/10/2017   CREATININE 0.88 10/02/2017 0424   CALCIUM 8.6 (L) 10/02/2017 0424   PROT 5.3 (L)  09/30/2017 0840   ALBUMIN 2.4 (L) 09/30/2017 0840   AST 13 (L) 09/30/2017 0840   ALT 7 (L) 09/30/2017 0840   ALKPHOS 69 09/30/2017 0840   BILITOT 0.7 09/30/2017 0840   GFRNONAA 56 (L) 10/02/2017 0424   GFRAA >60 10/02/2017 0424   Lipase     Component Value Date/Time   LIPASE 24 05/09/2017 1705       Studies/Results: No results found.  Anti-infectives: Anti-infectives (From admission, onward)   Start     Dose/Rate Route Frequency Ordered Stop   09/28/17 0400  cefoTEtan (CEFOTAN) 2 g in sodium chloride 0.9 % 100 mL IVPB     2 g 200 mL/hr over 30 Minutes Intravenous Every 12 hours 09/27/17 1901 09/28/17 0648   09/27/17 1200  cefoTEtan (CEFOTAN) 2 g in sodium chloride 0.9 % 100 mL IVPB  Status:  Discontinued     2 g 200 mL/hr over 30 Minutes Intravenous On call to O.R. 09/27/17 0941 09/27/17 1705       Assessment/Plan Atrial fibrillation - not on anticoagulants Dementia CKD Chronic diastolic heart failure DNR/DNI Anemia - Hg 8.9, stable AKI - improved, Cr 0.88  Recurrent sigmoid volvulus S/psigmoid colectomy with Hartmann procedure and end colostomy4/25 Dr. Hassell Done -POD 5 - path: COLON WITH FEATURES CONSISTENT WITH VOLVULUS, FOUR BENIGN LYMPH NODES (0/4), NO EVIDENCE OF MALIGNANCY - air in colostomy bag, liquid output but  no stool  ID -cefotetan perioperative FEN -IVF,sips of full liquids/Boost Breeze, colace/miralax VTE -SCDs, lovenox Foley -wick Follow up -Dr. Hassell Done  Plan-No stool from ostomy last 24 hours. Continue sips of full liquids as tolerated and bowel regimen. Awaiting improved bowel function prior to advancing diet. Continue PT/OOB.   LOS: 16 days    [name] , [facility] Surgery 10/02/2017, 9:02 AM Pager: [phone] Consults: [phone] Mon-Fri 7:00 am-4:30 pm Sat-Sun 7:00 am-11:30 am

## 2017-10-02 NOTE — Consult Note (Addendum)
[place] Nurse ostomy consult note Stoma type/location:  Colostomy surgery performed 4/25; WOC learned of patient today. Stomal assessment/size: Stoma is edematous, red and viable, above skin level, 1 3/4 inches Peristomal assessment:  Intact skin surrounding Output: small amt blood-tinged drainage, no stool or flatus  Ostomy pouching: 1pc.  Education provided:  Pt lived at a SNF prior to admission.  She will require total assistance with pouch application and emptying.  Her son was at the bedside but states patient will have other caregivers.  Pt did not look at the stoma or pouching procedure or ask questions; according to the EMR she has dementia and does not appear to realize she has a colostomy. Supplies left at bedside for staff nurse use. Enrolled patient in [place] program: No; pt is expected to return to a SNF after discharge. [place] team will continue to follow while in the hospital. Julien Girt MSN, RN, [place], [place], [place]

## 2017-10-03 DIAGNOSIS — I5032 Chronic diastolic (congestive) heart failure: Secondary | ICD-10-CM

## 2017-10-03 DIAGNOSIS — L8990 Pressure ulcer of unspecified site, unspecified stage: Secondary | ICD-10-CM

## 2017-10-03 DIAGNOSIS — F0281 Dementia in other diseases classified elsewhere with behavioral disturbance: Secondary | ICD-10-CM

## 2017-10-03 DIAGNOSIS — N179 Acute kidney failure, unspecified: Secondary | ICD-10-CM

## 2017-10-03 DIAGNOSIS — N189 Chronic kidney disease, unspecified: Secondary | ICD-10-CM

## 2017-10-03 DIAGNOSIS — K219 Gastro-esophageal reflux disease without esophagitis: Secondary | ICD-10-CM

## 2017-10-03 DIAGNOSIS — I482 Chronic atrial fibrillation: Secondary | ICD-10-CM

## 2017-10-03 DIAGNOSIS — E876 Hypokalemia: Secondary | ICD-10-CM

## 2017-10-03 LAB — GLUCOSE, CAPILLARY
GLUCOSE-CAPILLARY: 120 mg/dL — AB (ref 65–99)
GLUCOSE-CAPILLARY: 121 mg/dL — AB (ref 65–99)
GLUCOSE-CAPILLARY: 127 mg/dL — AB (ref 65–99)
Glucose-Capillary: 119 mg/dL — ABNORMAL HIGH (ref 65–99)
Glucose-Capillary: 132 mg/dL — ABNORMAL HIGH (ref 65–99)
Glucose-Capillary: 136 mg/dL — ABNORMAL HIGH (ref 65–99)

## 2017-10-03 LAB — CBC WITH DIFFERENTIAL/PLATELET
BASOS ABS: 0 10*3/uL (ref 0.0–0.1)
Basophils Relative: 1 %
EOS ABS: 0.2 10*3/uL (ref 0.0–0.7)
Eosinophils Relative: 6 %
HCT: 25.6 % — ABNORMAL LOW (ref 36.0–46.0)
HEMOGLOBIN: 8.4 g/dL — AB (ref 12.0–15.0)
Lymphocytes Relative: 36 %
Lymphs Abs: 1.4 10*3/uL (ref 0.7–4.0)
MCH: 34 pg (ref 26.0–34.0)
MCHC: 32.8 g/dL (ref 30.0–36.0)
MCV: 103.6 fL — ABNORMAL HIGH (ref 78.0–100.0)
MONOS PCT: 12 %
Monocytes Absolute: 0.5 10*3/uL (ref 0.1–1.0)
NEUTROS ABS: 1.9 10*3/uL (ref 1.7–7.7)
NEUTROS PCT: 47 %
Platelets: 136 10*3/uL — ABNORMAL LOW (ref 150–400)
RBC: 2.47 MIL/uL — ABNORMAL LOW (ref 3.87–5.11)
RDW: 15.4 % (ref 11.5–15.5)
WBC: 4 10*3/uL (ref 4.0–10.5)

## 2017-10-03 LAB — BASIC METABOLIC PANEL
ANION GAP: 6 (ref 5–15)
BUN: <5 mg/dL — ABNORMAL LOW (ref 6–20)
CALCIUM: 8.7 mg/dL — AB (ref 8.9–10.3)
CO2: 20 mmol/L — AB (ref 22–32)
CREATININE: 0.9 mg/dL (ref 0.44–1.00)
Chloride: 115 mmol/L — ABNORMAL HIGH (ref 101–111)
GFR calc non Af Amer: 55 mL/min — ABNORMAL LOW (ref >60)
Glucose, Bld: 124 mg/dL — ABNORMAL HIGH (ref 65–99)
Potassium: 4.1 mmol/L (ref 3.5–5.1)
Sodium: 141 mmol/L (ref 135–145)

## 2017-10-03 MED ORDER — TRAMADOL 5 MG/ML ORAL SUSPENSION: 25.0000 mg | Freq: Four times a day (QID) | ORAL | Status: DC | PRN

## 2017-10-03 MED ORDER — TRAMADOL HCL 50 MG PO TABS: 25.0000 mg | ORAL_TABLET | Freq: Four times a day (QID) | ORAL | Status: DC | PRN

## 2017-10-03 MED ORDER — HALOPERIDOL LACTATE 5 MG/ML IJ SOLN
5.0000 mg | Freq: Once | INTRAMUSCULAR | Status: AC
  Administered 2017-10-03: 5 mg via INTRAVENOUS
  Filled 2017-10-03: qty 1

## 2017-10-03 MED ORDER — OXYCODONE HCL 5 MG PO TABS: 2.5000 mg | ORAL_TABLET | Freq: Four times a day (QID) | ORAL | Status: DC | PRN

## 2017-10-03 MED ORDER — INSULIN ASPART 100 UNIT/ML ~~LOC~~ SOLN: 0.0000 [IU] | Freq: Three times a day (TID) | SUBCUTANEOUS | Status: DC

## 2017-10-03 NOTE — Progress Notes (Addendum)
PROGRESS NOTE    Maria Christian  BPZ:[id number] DOB: 1924-11-05 DOA: 09/15/2017 PCP: [name], DO    Brief Narrative:  Pt. with PMH of HTN, A. fib, chronic diastolic CHF, recurrent sigmoid volvulus; admitted on 09/15/2017, presented with complaint of abdominal pain, was found to have sigmoid volvulus.  Initially patient refused surgical correction and GI was consulted.  Underwent decompression x2 with GI without any significant improvement and therefore palliative care was consulted.  After discussion with son and palliative care patient decided to proceed with surgical correction. Currently further plan is to monitor postoperative recovery.     Assessment & Plan:   Principal Problem:   Sigmoid volvulus (HCC) Active Problems:   Chronic diastolic CHF (congestive heart failure) (HCC)   Thrombocytopenia (HCC)   Dementia with behavioral disturbance   Normocytic anemia   Chronic atrial fibrillation (HCC)   Acute kidney injury superimposed on chronic kidney disease (HCC)   GERD (gastroesophageal reflux disease)   Protein-calorie malnutrition, severe   Pressure injury of skin  1. recurrent sigmoid volvulus. S/P decompression x2 by GI. On 09/17/2017 and 09/18/2017. Patient subsequently underwent colonoscopy there were some areas of ulceration in the colon concerning for ischemia no malignancy. Repeat x-ray on 4/22 shows persistent sigmoid volvulus. General surgery subsequently consulted.  Patient underwent colectomy with [facility] procedure and colostomy.  Colostomy with air in bag.  No stool noted.  Diet has been advanced to full liquid diet per general surgery.  Per general surgery.  2.  Acute kidney injury. Prerenal etiology.  Improved with hydration.  Creatinine currently at 0.90.  Creatinine on admission was 2.1.  Baseline creatinine approximately 1.  Saline lock IV fluids.  3.  Hypokalemia. Repleted.  Monitor closely.  Keep potassium greater than 4.  Saline lock IV  fluids.  4. Chronic respiratory failure with hypoxia Currently stable.  Percent on oxygen at nursing home as needed.    5. Chronic headache/?? Migraine Ongoing for years, worsening over the past couple of months as per daughter CT scan of the head on 2018 showed no acute abnormalities Patient has an appointment with neurology outpatient on Oct 10, 2017, family requested patient has MRI prior to the visit, Dr Posey Pronto recommended it was currently not indicated. Continue to monitor  6. Chronic atrial fibrillation Currently rate controlled.  Patient likely not a anticoagulation candidate due to increased risk of bleeding and age.  Follow for now.   7. Macrocytic anemia: H/H stable at 8.4.  Patient with no overt bleeding.  Follow.   8. Chronic diastolic heart failure Currently euvolemic and compensated.  Once patient tolerating a regular diet will resume home dose Lasix.  Monitor closely for volume overload.    9. Chronic thrombocytopenia Stable.  Follow.   10. Dementia with behavioral disturbance Currently stable.  Continue Haldol and Ambien as needed.  11.  Diet-controlled diabetes mellitus Hemoglobin A1c was 6.2 on 06/12/2017.  Change CBGs to before meals and at bedtime.  Will customize sliding scale insulin.  Outpatient follow-up.     DVT prophylaxis: Lovenox Code Status: DNR Family Communication: Updated patient and son at bedside.  Answered all son's questions and concerns. Disposition Plan: Skilled nursing facility when cleared by general surgery.   Consultants:   Wound care Maryfrances Bunnell, RN  10/02/2017  Palliaitve Care: [name] [birth year]  General surgery: Dr. Excell Seltzer 09/20/2017  Dr. Lyndel Safe 09/15/2017  Procedures:   CT abdomen and pelvis 09/15/2017  Abdominal films 09/15/2017, 09/16/2017, 09/17/2017, 09/18/2017, pulse 72,019, 09/20/2017, 09/24/2017  Flexible sigmoidoscopy with decompression 09/15/2017  Colonoscopy and decompression 09/17/2017,  09/18/2017  Laparoscopically assisted sigmoid colectomy with Hartman's procedure and end colostomy per Dr. Hassell Done 09/27/2017  Antimicrobials:   Cefotan perioperatively for 25 2019   Subjective: Sitting up in the chair.  Denies any chest pain or shortness of breath.  Asking for solid food.  Denies any abdominal pain.  No nausea or emesis.  Son asking why CBGs every 4 hours.  Son feels patient should not be getting any insulin replacement with CBGs less than 150.  Son asking for IV morphine to be discontinued as this feels affects his mother's mentation.  Objective: Vitals:   10/02/17 1840 10/02/17 2242 10/03/17 0413 10/03/17 1807  BP: 136/77 (!) 144/67 128/80 (!) 153/67  Pulse: (!) 127 89 87 91  Resp: (!) 22  20 18   Temp: 98.6 F (37 C) 98.5 F (36.9 C) 98.1 F (36.7 C) 100 F (37.8 C)  TempSrc: Oral Oral Oral Oral  SpO2: 96% 100% 100% 100%  Weight:      Height: 5\' 1"  (1.549 m)       Intake/Output Summary (Last 24 hours) at 10/03/2017 1901 Last data filed at 10/03/2017 0500 Gross per 24 hour  Intake -  Output 1150 ml  Net -1150 ml   Filed Weights   09/15/17 2200 09/16/17 1105  Weight: 71 kg (156 lb 8.4 oz) 71 kg (156 lb 8.4 oz)    Examination:  General exam: Appears calm and comfortable  Respiratory system: Clear to auscultation. Respiratory effort normal. Cardiovascular system: Irregularly irregular.  No JVD, no murmurs, no rubs, no gallops.  No lower extremity edema.  Gastrointestinal system: Abdomen is soft, nontender to palpation, positive bowel sounds, minimal to mildly distended.  Incision site clean/dry/intact.  Staples intact.  No drainage noted.  Ostomy with air and minimal serosanguineous fluid in bag.  Central nervous system: Alert and oriented. No focal neurological deficits. Extremities: Symmetric 5 x 5 power. Skin: No rashes, lesions or ulcers Psychiatry: Judgement and insight appear normal. Mood & affect appropriate.     Data Reviewed: I have personally  reviewed following labs and imaging studies  CBC: Recent Labs  Lab 09/27/17 0349  09/29/17 0355 09/30/17 0840 10/01/17 0724 10/02/17 0424 10/03/17 0402  WBC 3.8*   < > 5.7 3.7* 3.5* 3.8* 4.0  NEUTROABS 1.9  --   --  2.2 1.9 2.1 1.9  HGB 9.9*   < > 7.9* 7.9* 8.4* 8.9* 8.4*  HCT 30.8*   < > 24.1* 24.0* 25.9* 27.3* 25.6*  MCV 104.4*   < > 105.2* 104.8* 104.4* 103.8* 103.6*  PLT 148*   < > 118* 101* 131* 136* 136*   < > = values in this interval not displayed.   Basic Metabolic Panel: Recent Labs  Lab 09/29/17 0355 09/30/17 0840 10/01/17 0724 10/02/17 0424 10/03/17 0402  NA 142 140 137 142 141  K 3.7 3.6 4.0 3.8 4.1  CL 117* 115* 115* 117* 115*  CO2 18* 19* 18* 20* 20*  GLUCOSE 114* 112* 128* 172* 124*  BUN 9 8 6  5* <5*  CREATININE 1.22* 1.10* 1.01* 0.88 0.90  CALCIUM 8.1* 8.4* 8.3* 8.6* 8.7*   GFR: Estimated Creatinine Clearance: 37.4 mL/min (by C-G formula based on SCr of 0.9 mg/dL). Liver Function Tests: Recent Labs  Lab 09/30/17 0840  AST 13*  ALT 7*  ALKPHOS 69  BILITOT 0.7  PROT 5.3*  ALBUMIN 2.4*   No results for input(s): LIPASE, AMYLASE in the  last 168 hours. No results for input(s): AMMONIA in the last 168 hours. Coagulation Profile: No results for input(s): INR, PROTIME in the last 168 hours. Cardiac Enzymes: No results for input(s): CKTOTAL, CKMB, CKMBINDEX, TROPONINI in the last 168 hours. BNP (last 3 results) No results for input(s): PROBNP in the last 8760 hours. HbA1C: No results for input(s): HGBA1C in the last 72 hours. CBG: Recent Labs  Lab 10/03/17 0007 10/03/17 0407 10/03/17 0748 10/03/17 1235 10/03/17 1810  GLUCAP 120* 119* 132* 136* 127*   Lipid Profile: No results for input(s): CHOL, HDL, LDLCALC, TRIG, CHOLHDL, LDLDIRECT in the last 72 hours. Thyroid Function Tests: No results for input(s): TSH, T4TOTAL, FREET4, T3FREE, THYROIDAB in the last 72 hours. Anemia Panel: No results for input(s): VITAMINB12, FOLATE, FERRITIN,  TIBC, IRON, RETICCTPCT in the last 72 hours. Sepsis Labs: No results for input(s): PROCALCITON, LATICACIDVEN in the last 168 hours.  Recent Results (from the past 240 hour(s))  Surgical PCR screen     Status: Abnormal   Collection Time: 09/27/17  9:55 AM  Result Value Ref Range Status   MRSA, PCR NEGATIVE NEGATIVE Final   Staphylococcus aureus POSITIVE (A) NEGATIVE Final    Comment: (NOTE) The Xpert SA Assay (FDA approved for NASAL specimens in patients 32 years of age and older), is one component of a comprehensive surveillance program. It is not intended to diagnose infection nor to guide or monitor treatment. Performed at [facility], [place] [address] [place], [place] [address]          Radiology Studies: No results found.      Scheduled Meds: . docusate sodium  100 mg Oral BID  . enoxaparin (LOVENOX) injection  40 mg Subcutaneous Q24H  . famotidine  20 mg Oral BID  . feeding supplement  1 Container Oral BID BM  . insulin aspart  0-15 Units Subcutaneous TID WC  . polyethylene glycol  17 g Oral Daily   Continuous Infusions: . dextrose 5 % and 0.45 % NaCl with KCl 20 mEq/L 50 mL/hr at 10/03/17 0829     LOS: 17 days    Time spent: 40 minutes    Irine Seal, MD [facility] Pager [phone]  If 7PM-7AM, please contact night-coverage [email or web address] Password [email or web address] 10/03/2017, 7:01 PM

## 2017-10-03 NOTE — Progress Notes (Signed)
[facility] Progress Note  6 Days Post-Op  Subjective: CC-  Son at bedside. States that Ms. Rafuse has been complaining about small things more over night, and sees this as a sign that she is acting more like herself and improving. States that she has had a significant amount of more flatus from ostomy, no stool last 48 hours. She denies n/v. Tolerating sips of fulls.  Objective: Vital signs in last 24 hours: Temp:  [98.1 F (36.7 C)-98.6 F (37 C)] 98.1 F (36.7 C) (05/01 0413) Pulse Rate:  [80-127] 87 (05/01 0413) Resp:  [18-22] 20 (05/01 0413) BP: (128-150)/(61-110) 128/80 (05/01 0413) SpO2:  [96 %-100 %] 100 % (05/01 0413) Last BM Date: 09/27/17  Intake/Output from previous day: 04/30 0701 - 05/01 0700 In: 480 [P.O.:480] Out: 1150 [Urine:1150] Intake/Output this shift: No intake/output data recorded.  PE: Gen: Alert, NAD HEENT: EOM's intact, pupils equal and round Card:Irregularly irregular Pulm:CTAB, no W/R/R, effort normal Abd: Soft,nondistended,nontender,+BS,midlineincision C/D/I with staples intact and no erythema or drainage, ostomy pink/edematous withair and trace serosanguinous fluid in bag  Lab Results:  Recent Labs    10/02/17 0424 10/03/17 0402  WBC 3.8* 4.0  HGB 8.9* 8.4*  HCT 27.3* 25.6*  PLT 136* 136*   BMET Recent Labs    10/02/17 0424 10/03/17 0402  NA 142 141  K 3.8 4.1  CL 117* 115*  CO2 20* 20*  GLUCOSE 172* 124*  BUN 5* <5*  CREATININE 0.88 0.90  CALCIUM 8.6* 8.7*   PT/INR No results for input(s): LABPROT, INR in the last 72 hours. CMP     Component Value Date/Time   NA 141 10/03/2017 0402   NA 144 07/10/2017   K 4.1 10/03/2017 0402   CL 115 (H) 10/03/2017 0402   CO2 20 (L) 10/03/2017 0402   GLUCOSE 124 (H) 10/03/2017 0402   BUN <5 (L) 10/03/2017 0402   BUN 11 07/10/2017   CREATININE 0.90 10/03/2017 0402   CALCIUM 8.7 (L) 10/03/2017 0402   PROT 5.3 (L) 09/30/2017 0840   ALBUMIN 2.4 (L)  09/30/2017 0840   AST 13 (L) 09/30/2017 0840   ALT 7 (L) 09/30/2017 0840   ALKPHOS 69 09/30/2017 0840   BILITOT 0.7 09/30/2017 0840   GFRNONAA 55 (L) 10/03/2017 0402   GFRAA >60 10/03/2017 0402   Lipase     Component Value Date/Time   LIPASE 24 05/09/2017 1705       Studies/Results: No results found.  Anti-infectives: Anti-infectives (From admission, onward)   Start     Dose/Rate Route Frequency Ordered Stop   09/28/17 0400  cefoTEtan (CEFOTAN) 2 g in sodium chloride 0.9 % 100 mL IVPB     2 g 200 mL/hr over 30 Minutes Intravenous Every 12 hours 09/27/17 1901 09/28/17 0648   09/27/17 1200  cefoTEtan (CEFOTAN) 2 g in sodium chloride 0.9 % 100 mL IVPB  Status:  Discontinued     2 g 200 mL/hr over 30 Minutes Intravenous On call to O.R. 09/27/17 0941 09/27/17 1705       Assessment/Plan Atrial fibrillation - not on anticoagulants Dementia CKD Chronic diastolic heart failure DNR/DNI Anemia - Hg 8.4,stable AKI - improved, Cr 0.9.  Recurrent sigmoid volvulus S/psigmoid colectomy with Hartmann procedure and end colostomy4/25 Dr. Hassell Done -POD6 - path: [place], FOUR BENIGN LYMPH NODES (0/4), NO EVIDENCE OF MALIGNANCY - increased flatus but no stool output from colostomy  ID -cefotetan perioperative FEN -IVF,sips of full liquids/Boost Breeze, colace/miralax VTE -  SCDs,lovenox Foley -wick Follow up -Dr. Hassell Done  Plan-No stool from ostomy but she is having increased flatus. Will continue sips of full liquids for now and bowel regimen. Continue OOB/mobilization as able.    LOS: 17 days    [name] , [facility] Surgery 10/03/2017, 7:55 AM Pager: [phone] Consults: [phone] Mon-Fri 7:00 am-4:30 pm Sat-Sun 7:00 am-11:30 am

## 2017-10-04 LAB — CBC WITH DIFFERENTIAL/PLATELET
Basophils Absolute: 0 10*3/uL (ref 0.0–0.1)
Basophils Relative: 1 %
EOS ABS: 0.2 10*3/uL (ref 0.0–0.7)
EOS PCT: 5 %
HCT: 26 % — ABNORMAL LOW (ref 36.0–46.0)
Hemoglobin: 8.5 g/dL — ABNORMAL LOW (ref 12.0–15.0)
LYMPHS ABS: 0.9 10*3/uL (ref 0.7–4.0)
LYMPHS PCT: 24 %
MCH: 33.6 pg (ref 26.0–34.0)
MCHC: 32.7 g/dL (ref 30.0–36.0)
MCV: 102.8 fL — AB (ref 78.0–100.0)
MONO ABS: 0.5 10*3/uL (ref 0.1–1.0)
MONOS PCT: 12 %
Neutro Abs: 2.2 10*3/uL (ref 1.7–7.7)
Neutrophils Relative %: 58 %
PLATELETS: 132 10*3/uL — AB (ref 150–400)
RBC: 2.53 MIL/uL — ABNORMAL LOW (ref 3.87–5.11)
RDW: 15.3 % (ref 11.5–15.5)
WBC: 3.8 10*3/uL — ABNORMAL LOW (ref 4.0–10.5)

## 2017-10-04 LAB — GLUCOSE, CAPILLARY
GLUCOSE-CAPILLARY: 115 mg/dL — AB (ref 65–99)
GLUCOSE-CAPILLARY: 146 mg/dL — AB (ref 65–99)
GLUCOSE-CAPILLARY: 154 mg/dL — AB (ref 65–99)
Glucose-Capillary: 142 mg/dL — ABNORMAL HIGH (ref 65–99)

## 2017-10-04 LAB — BASIC METABOLIC PANEL
Anion gap: 8 (ref 5–15)
CO2: 22 mmol/L (ref 22–32)
CREATININE: 0.85 mg/dL (ref 0.44–1.00)
Calcium: 8.8 mg/dL — ABNORMAL LOW (ref 8.9–10.3)
Chloride: 111 mmol/L (ref 101–111)
GFR calc Af Amer: >60 mL/min (ref >60)
GFR calc non Af Amer: 59 mL/min — ABNORMAL LOW (ref >60)
GLUCOSE: 128 mg/dL — AB (ref 65–99)
Potassium: 3.6 mmol/L (ref 3.5–5.1)
Sodium: 141 mmol/L (ref 135–145)

## 2017-10-04 MED ORDER — POTASSIUM CHLORIDE CRYS ER 20 MEQ PO TBCR: 20.0000 meq | EXTENDED_RELEASE_TABLET | Freq: Every day | ORAL | Status: DC

## 2017-10-04 MED ORDER — JUVEN PO PACK
1.0000 | PACK | Freq: Two times a day (BID) | ORAL | Status: DC
  Administered 2017-10-05 – 2017-10-06 (×4): 1 via ORAL
  Filled 2017-10-04 (×5): qty 1

## 2017-10-04 MED ORDER — POTASSIUM CHLORIDE CRYS ER 20 MEQ PO TBCR: 40.0000 meq | EXTENDED_RELEASE_TABLET | Freq: Once | ORAL | Status: DC

## 2017-10-04 MED ORDER — POTASSIUM CHLORIDE 20 MEQ PO PACK
20.0000 meq | PACK | Freq: Every day | ORAL | Status: DC
  Administered 2017-10-04 – 2017-10-05 (×2): 20 meq via ORAL
  Filled 2017-10-04 (×2): qty 1

## 2017-10-04 MED ORDER — POLYETHYLENE GLYCOL 3350 17 G PO PACK
17.0000 g | PACK | Freq: Two times a day (BID) | ORAL | Status: DC
  Administered 2017-10-04 – 2017-10-06 (×5): 17 g via ORAL
  Filled 2017-10-04 (×5): qty 1

## 2017-10-04 MED ORDER — POTASSIUM CHLORIDE CRYS ER 20 MEQ PO TBCR
40.0000 meq | EXTENDED_RELEASE_TABLET | Freq: Every day | ORAL | Status: DC
  Administered 2017-10-04 – 2017-10-06 (×3): 40 meq via ORAL
  Filled 2017-10-04 (×3): qty 2

## 2017-10-04 NOTE — Progress Notes (Signed)
[facility] Progress Note  7 Days Post-Op  Subjective: CC-  No family at bedside. Patient sitting up in bed eating breakfast. No complaints. Denies abdominal pain, nausea, or vomiting. Tolerating full liquids. Continues to have large amount of flatus from ostomy, no stool.   Objective: Vital signs in last 24 hours: Temp:  [98.8 F (37.1 C)-100 F (37.8 C)] 98.8 F (37.1 C) (05/02 0601) Pulse Rate:  [88-92] 92 (05/02 0601) Resp:  [18-20] 18 (05/02 0601) BP: (139-153)/(67-78) 139/78 (05/02 0601) SpO2:  [99 %-100 %] 100 % (05/02 0601) Last BM Date: 09/27/17(family refused senna)  Intake/Output from previous day: 05/01 0701 - 05/02 0700 In: 450 [P.O.:450] Out: 1480 [Urine:1475; Stool:5] Intake/Output this shift: No intake/output data recorded.  PE: Gen: Alert,NAD HEENT: EOM's intact, pupils equal and round Card:Irregularly irregular Pulm:CTAB, no W/R/R, effort normal Abd: Soft,nondistended,nontender,+BS,midlineincision C/D/I withstaples intact and no erythema or drainage, ostomy pink/edematouswithairand trace serosanguinous fluid in bag  Lab Results:  Recent Labs    10/03/17 0402 10/04/17 0358  WBC 4.0 3.8*  HGB 8.4* 8.5*  HCT 25.6* 26.0*  PLT 136* 132*   BMET Recent Labs    10/03/17 0402 10/04/17 0358  NA 141 141  K 4.1 3.6  CL 115* 111  CO2 20* 22  GLUCOSE 124* 128*  BUN <5* <5*  CREATININE 0.90 0.85  CALCIUM 8.7* 8.8*   PT/INR No results for input(s): LABPROT, INR in the last 72 hours. CMP     Component Value Date/Time   NA 141 10/04/2017 0358   NA 144 07/10/2017   K 3.6 10/04/2017 0358   CL 111 10/04/2017 0358   CO2 22 10/04/2017 0358   GLUCOSE 128 (H) 10/04/2017 0358   BUN <5 (L) 10/04/2017 0358   BUN 11 07/10/2017   CREATININE 0.85 10/04/2017 0358   CALCIUM 8.8 (L) 10/04/2017 0358   PROT 5.3 (L) 09/30/2017 0840   ALBUMIN 2.4 (L) 09/30/2017 0840   AST 13 (L) 09/30/2017 0840   ALT 7 (L) 09/30/2017 0840   ALKPHOS 69 09/30/2017 0840   BILITOT 0.7 09/30/2017 0840   GFRNONAA 59 (L) 10/04/2017 0358   GFRAA >60 10/04/2017 0358   Lipase     Component Value Date/Time   LIPASE 24 05/09/2017 1705       Studies/Results: No results found.  Anti-infectives: Anti-infectives (From admission, onward)   Start     Dose/Rate Route Frequency Ordered Stop   09/28/17 0400  cefoTEtan (CEFOTAN) 2 g in sodium chloride 0.9 % 100 mL IVPB     2 g 200 mL/hr over 30 Minutes Intravenous Every 12 hours 09/27/17 1901 09/28/17 0648   09/27/17 1200  cefoTEtan (CEFOTAN) 2 g in sodium chloride 0.9 % 100 mL IVPB  Status:  Discontinued     2 g 200 mL/hr over 30 Minutes Intravenous On call to O.R. 09/27/17 0941 09/27/17 1705       Assessment/Plan Atrial fibrillation - not on anticoagulants Dementia CKD Chronic diastolic heart failure DNR/DNI Anemia - Hg 8.5,stable AKI -improved,Cr0.85.  Recurrent sigmoid volvulus S/psigmoid colectomy with Hartmann procedure and end colostomy4/25 Dr. Hassell Done -POD7 - path:COLON [place] (0/4),NO EVIDENCE OF MALIGNANCY - passing flatus, no stool, tolerating full liquids  ID -cefotetan perioperative FEN -IVF,soft diet, Boost, miralax BID VTE -SCDs,lovenox Foley -wick Follow up -Dr. Hassell Done  Plan-Tolerating fulls and passing flatus. Abdomen soft and nontender. Will advance to soft diet. Increase miralax to BID.     LOS: 18 days  [name] , [facility] Surgery 10/04/2017, 9:32 AM Pager: [phone] Consults: [phone] Mon-Fri 7:00 am-4:30 pm Sat-Sun 7:00 am-11:30 am

## 2017-10-04 NOTE — Progress Notes (Signed)
PROGRESS NOTE    Maria Christian  KKX:[id number] DOB: [birth year] DOA: 09/15/2017 PCP: [name], DO    Brief Narrative:  Pt. with PMH of HTN, A. fib, chronic diastolic CHF, recurrent sigmoid volvulus; admitted on 09/15/2017, presented with complaint of abdominal pain, was found to have sigmoid volvulus.  Initially patient refused surgical correction and GI was consulted.  Underwent decompression x2 with GI without any significant improvement and therefore palliative care was consulted.  After discussion with son and palliative care patient decided to proceed with surgical correction. Currently further plan is to monitor postoperative recovery.     Assessment & Plan:   Principal Problem:   Sigmoid volvulus (HCC) Active Problems:   Chronic diastolic CHF (congestive heart failure) (HCC)   Thrombocytopenia (HCC)   Dementia with behavioral disturbance   Normocytic anemia   Chronic atrial fibrillation (HCC)   Acute kidney injury superimposed on chronic kidney disease (HCC)   GERD (gastroesophageal reflux disease)   Protein-calorie malnutrition, severe   Pressure injury of skin  1. recurrent sigmoid volvulus. S/P decompression x2 by GI. On 09/17/2017 and 09/18/2017. Patient subsequently underwent colonoscopy there were some areas of ulceration in the colon concerning for ischemia no malignancy. Repeat x-ray on 4/22 shows persistent sigmoid volvulus. General surgery subsequently consulted.  Patient underwent colectomy with [facility] procedure and colostomy.  Colostomy with air in bag and small amount of brown stool.  Patient tolerating full liquid diet.  Diet has been advanced to soft diet per general surgery.  Per general surgery.  2.  Acute kidney injury. Prerenal etiology.  Improved with hydration.  Creatinine currently at 0.85.  Creatinine on admission was 2.1.  Baseline creatinine approximately 1.  Saline lock IV fluids.  Likely resume home dose diuretics in the next 24 to 48 hours  once tolerating adequate oral intake.  3.  Hypokalemia. Keep potassium greater than 4.  Will resume home regimen of oral potassium supplementation.   4. Chronic respiratory failure with hypoxia Stable.  Not hypoxic.  5. Chronic headache/?? Migraine Ongoing for years, worsening over the past couple of months as per daughter CT scan of the head on 2018 showed no acute abnormalities Patient has an appointment with neurology outpatient on Oct 10, 2017, family requested patient has MRI prior to the visit, Dr Posey Pronto recommended it was currently not indicated. Continue to monitor  6. Chronic atrial fibrillation Currently rate controlled.  Patient likely not a anticoagulation candidate due to increased risk of bleeding and age.  Follow for now.   7. Macrocytic anemia: H/H stable at 8.5.  No overt bleeding.   8. Chronic diastolic heart failure Compensated.  Euvolemic.  Tolerating full liquid diet.  Diet advance to soft diet.  Patient currently asymptomatic.  Patient is not hypoxic.  Saline lock IV fluids.  Once patient tolerating a regular diet will resume home dose Lasix.  Monitor closely for volume overload.    9. Chronic thrombocytopenia Stable.  Follow.   10. Dementia with behavioral disturbance Stable.  No agitation.  Continue Ambien and Haldol as needed.    11.  Diet-controlled diabetes mellitus Hemoglobin A1c was 6.2 on 06/12/2017.  CBGs range from 115-146.  Continue customize sliding scale insulin.  Outpatient follow-up.     DVT prophylaxis: Lovenox Code Status: DNR Family Communication: Updated patient.  No family at bedside. Disposition Plan: Skilled nursing facility when cleared by general surgery.   Consultants:   Wound care Maryfrances Bunnell, RN  10/02/2017  Palliaitve Care: [name] [birth year]  General surgery: Dr. Excell Seltzer 09/20/2017  Dr. Lyndel Safe 09/15/2017  Procedures:   CT abdomen and pelvis 09/15/2017  Abdominal films 09/15/2017, 09/16/2017, 09/17/2017,  09/18/2017, pulse 72,019, 09/20/2017, 09/24/2017  Flexible sigmoidoscopy with decompression 09/15/2017  Colonoscopy and decompression 09/17/2017, 09/18/2017  Laparoscopically assisted sigmoid colectomy with Hartman's procedure and end colostomy per Dr. Hassell Done 09/27/2017  Antimicrobials:   Cefotan perioperatively 09/27/2017   Subjective: In chair.  No shortness of breath.  No chest pain.  No nausea or emesis.  Tolerated full liquid diet.  Informed patient that her diet has been upgraded to a soft diet per general surgery.    Objective: Vitals:   10/03/17 0413 10/03/17 1807 10/03/17 2204 10/04/17 0601  BP: 128/80 (!) 153/67 140/68 139/78  Pulse: 87 91 88 92  Resp: 20 18 20 18   Temp: 98.1 F (36.7 C) 100 F (37.8 C) 99.5 F (37.5 C) 98.8 F (37.1 C)  TempSrc: Oral Oral Oral Oral  SpO2: 100% 100% 99% 100%  Weight:      Height:        Intake/Output Summary (Last 24 hours) at 10/04/2017 1330 Last data filed at 10/04/2017 1133 Gross per 24 hour  Intake -  Output 1980 ml  Net -1980 ml   Filed Weights   09/15/17 2200 09/16/17 1105  Weight: 71 kg (156 lb 8.4 oz) 71 kg (156 lb 8.4 oz)    Examination:  General exam: Appears calm and comfortable  Respiratory system: Lungs clear to auscultation bilaterally.  No wheezes, no crackles, no rhonchi.  Respiratory effort normal. Cardiovascular system: Irregularly irregular.  Lower extremity edema.  No JVD, no murmurs, no rubs, no gallops. Gastrointestinal system: Abdomen is nontender, nondistended, soft, positive bowel sounds.  Staples/incision site clean/dry/intact.  No drainage noted.  Ostomy with air and small amount of brownish stool.  Central nervous system: Alert and oriented. No focal neurological deficits. Extremities: Symmetric 5 x 5 power. Skin: No rashes, lesions or ulcers Psychiatry: Judgement and insight appear normal. Mood & affect appropriate.     Data Reviewed: I have personally reviewed following labs and imaging  studies  CBC: Recent Labs  Lab 09/30/17 0840 10/01/17 0724 10/02/17 0424 10/03/17 0402 10/04/17 0358  WBC 3.7* 3.5* 3.8* 4.0 3.8*  NEUTROABS 2.2 1.9 2.1 1.9 2.2  HGB 7.9* 8.4* 8.9* 8.4* 8.5*  HCT 24.0* 25.9* 27.3* 25.6* 26.0*  MCV 104.8* 104.4* 103.8* 103.6* 102.8*  PLT 101* 131* 136* 136* 664*   Basic Metabolic Panel: Recent Labs  Lab 09/30/17 0840 10/01/17 0724 10/02/17 0424 10/03/17 0402 10/04/17 0358  NA 140 137 142 141 141  K 3.6 4.0 3.8 4.1 3.6  CL 115* 115* 117* 115* 111  CO2 19* 18* 20* 20* 22  GLUCOSE 112* 128* 172* 124* 128*  BUN 8 6 5* <5* <5*  CREATININE 1.10* 1.01* 0.88 0.90 0.85  CALCIUM 8.4* 8.3* 8.6* 8.7* 8.8*   GFR: Estimated Creatinine Clearance: 39.7 mL/min (by C-G formula based on SCr of 0.85 mg/dL). Liver Function Tests: Recent Labs  Lab 09/30/17 0840  AST 13*  ALT 7*  ALKPHOS 69  BILITOT 0.7  PROT 5.3*  ALBUMIN 2.4*   No results for input(s): LIPASE, AMYLASE in the last 168 hours. No results for input(s): AMMONIA in the last 168 hours. Coagulation Profile: No results for input(s): INR, PROTIME in the last 168 hours. Cardiac Enzymes: No results for input(s): CKTOTAL, CKMB, CKMBINDEX, TROPONINI in the last 168 hours. BNP (last 3 results) No results for input(s): PROBNP in the last  8760 hours. HbA1C: No results for input(s): HGBA1C in the last 72 hours. CBG: Recent Labs  Lab 10/03/17 0748 10/03/17 1235 10/03/17 1810 10/03/17 2202 10/04/17 0747  GLUCAP 132* 136* 127* 121* 115*   Lipid Profile: No results for input(s): CHOL, HDL, LDLCALC, TRIG, CHOLHDL, LDLDIRECT in the last 72 hours. Thyroid Function Tests: No results for input(s): TSH, T4TOTAL, FREET4, T3FREE, THYROIDAB in the last 72 hours. Anemia Panel: No results for input(s): VITAMINB12, FOLATE, FERRITIN, TIBC, IRON, RETICCTPCT in the last 72 hours. Sepsis Labs: No results for input(s): PROCALCITON, LATICACIDVEN in the last 168 hours.  Recent Results (from the past 240  hour(s))  Surgical PCR screen     Status: Abnormal   Collection Time: 09/27/17  9:55 AM  Result Value Ref Range Status   MRSA, PCR NEGATIVE NEGATIVE Final   Staphylococcus aureus POSITIVE (A) NEGATIVE Final    Comment: (NOTE) The Xpert SA Assay (FDA approved for NASAL specimens in patients 71 years of age and older), is one component of a comprehensive surveillance program. It is not intended to diagnose infection nor to guide or monitor treatment. Performed at [facility], [place] [address], [place], [place] [address]          Radiology Studies: No results found.      Scheduled Meds: . docusate sodium  100 mg Oral BID  . enoxaparin (LOVENOX) injection  40 mg Subcutaneous Q24H  . famotidine  20 mg Oral BID  . feeding supplement  1 Container Oral BID BM  . insulin aspart  0-15 Units Subcutaneous TID WC  . nutrition supplement (JUVEN)  1 packet Oral BID BM  . polyethylene glycol  17 g Oral BID  . potassium chloride SA  20 mEq Oral QHS  . potassium chloride  40 mEq Oral Daily   Continuous Infusions:    LOS: 18 days    Time spent: 35 minutes    Irine Seal, MD [facility] Pager [phone]  If 7PM-7AM, please contact night-coverage [email or web address] Password [email or web address] 10/04/2017, 1:30 PM

## 2017-10-04 NOTE — Progress Notes (Signed)
Physical Therapy Treatment Patient Details Name: Maria Christian MRN: [id number] DOB: [birth year] Today's Date: 10/04/2017    History of Present Illness 84 yo female admitted with sigmoid volvulus. Hx of Afib, chf, Parkinson's disease, dementia, neuropathy, CKD, DM.  Pt s/p LAPAROSCOPIC ASSISTED  SIGMOID COLECTOMY WITH COLOSTOMY on 09/27/2017     PT Comments    Pt in recliner on arrival.  Pt agreeable to perform exercises again today. Pt happy to be moving extremities.  Family reports possible d/c Sat or Monday.  Follow Up Recommendations  SNF(rehab per SNF (pt resident))     Equipment Recommendations  None recommended by PT    Recommendations for Other Services       Precautions / Restrictions Precautions Precautions: Fall Precaution Comments: L UQ colostomy    Mobility  Bed Mobility               General bed mobility comments: pt up in recliner, nursing used hoyer lift for OOB  Transfers                 General transfer comment: NT - hoyer lift for OOB at baseline  Ambulation/Gait                 Stairs             Wheelchair Mobility    Modified Rankin (Stroke Patients Only)       Balance                                            Cognition Arousal/Alertness: Awake/alert Behavior During Therapy: WFL for tasks assessed/performed Overall Cognitive Status: Within Functional Limits for tasks assessed                                        Exercises General Exercises - Upper Extremity Shoulder Flexion: AROM;10 reps;Both Elbow Flexion: AROM;10 reps;Both;Other (comment)(lifted her teddy bear in room) General Exercises - Lower Extremity Ankle Circles/Pumps: AROM;Both;10 reps Quad Sets: AROM;Both;10 reps Heel Slides: AAROM;10 reps;Both Hip ABduction/ADduction: Both;AAROM;10 reps Straight Leg Raises: AAROM;10 reps;Both Other Exercises Other Exercises: also performed bil forward reaching for target  at midline x10 Other Exercises: L LE rests in internal rotation, attempted to keep neutral with exercises and pillow placed between legs end of session    General Comments        Pertinent Vitals/Pain Pain Assessment: No/denies pain    Home Living                      Prior Function            PT Goals (current goals can now be found in the care plan section) Progress towards PT goals: Progressing toward goals    Frequency    Min 1X/week      PT Plan Current plan remains appropriate    Co-evaluation              AM-PAC PT "6 Clicks" Daily Activity  Outcome Measure  Difficulty turning over in bed (including adjusting bedclothes, sheets and blankets)?: Unable Difficulty moving from lying on back to sitting on the side of the bed? : Unable Difficulty sitting down on and standing up from a chair with arms (e.g., wheelchair, bedside commode, etc,.)?: Unable Help  needed moving to and from a bed to chair (including a wheelchair)?: Total Help needed walking in hospital room?: Total Help needed climbing 3-5 steps with a railing? : Total 6 Click Score: 6    End of Session Equipment Utilized During Treatment: Oxygen Activity Tolerance: Patient tolerated treatment well Patient left: with call bell/phone within reach;in chair;with chair alarm set;with family/visitor present   PT Visit Diagnosis: Muscle weakness (generalized) (M62.81)     Time: 1610-9604 PT Time Calculation (min) (ACUTE ONLY): 16 min  Charges:  $Therapeutic Exercise: 8-22 mins                    G Codes:       Carmelia Bake, PT, DPT 10/04/2017 Pager: [phone]  York Ram E 10/04/2017, 2:56 PM

## 2017-10-04 NOTE — Consult Note (Addendum)
[place] Nurse ostomy follow up Stoma type/location: LUQ colostomy Stomal assessment/size: 1 3/4", pink moist, edematous Peristomal assessment: not assessed today Treatment options for stomal/peristomal skin: N/A Output bloody drainage and small amount of brown soft stool Ostomy pouching: 1pc Education provided: none, pt has dementia and will be going to SNF Son not present at this time. Enrolled patient in [place] Start Discharge program: No, returning to SNF Supplies at bedside for nursing. Bedside RN stated Surgeon digitally stimulated os and increased Miralax to BID. [place] team will follow while pt in house.  Fara Olden, RN-C, WTA-C, [place] Wound Treatment Associate Ostomy Care Associate

## 2017-10-04 NOTE — Progress Notes (Signed)
Nutrition Follow-up  DOCUMENTATION CODES:   Severe malnutrition in context of chronic illness  INTERVENTION:   -Continue Boost Breeze po TID, each supplement provides 250 kcal and 9 grams of protein -Provide Juven Fruit Punch BID, each serving provides 95kcal and 2.5g of protein (amino acids glutamine and arginine)  NUTRITION DIAGNOSIS:   Severe Malnutrition related to chronic illness(dementia, recurrent sigmoid volvulus) as evidenced by severe fat depletion, severe muscle depletion, energy intake < or equal to 50% for > or equal to 5 days.  Ongoing.  GOAL:   Patient will meet greater than or equal to 90% of their needs  Progressing.  MONITOR:   PO intake, Labs, Supplement acceptance, Weight trends, I & O's  ASSESSMENT:   Pt. with PMH of HTN, A. fib, chronic diastolic CHF, recurrent sigmoid volvulus; admitted on 09/15/2017, presented with complaint of abdominal pain, was found to have sigmoid volvulus.  4/25: s/p Laparoscopically assisted sigmoid colectomy with Jeanette Caprice procedure and end colostomy  Patient currently tolerating full liquids and diet was advanced to soft diet. Patient consuming Boost Breeze supplements. Given new pressure injury, will order Juven BID to aid in wound healing.   Medications: Colace capsule BID, Miralax packet BID, K-DUR tablet daily Labs reviewed: CBGs: 115-121   Diet Order:   Diet Order           DIET SOFT Room service appropriate? Yes; Fluid consistency: Thin  Diet effective now          EDUCATION NEEDS:   No education needs have been identified at this time  Skin:  Skin Assessment: Skin Integrity Issues: Skin Integrity Issues:: Stage II Stage II: buttocks  Last BM:  4/29  Height:   Ht Readings from Last 1 Encounters:  10/02/17 5\' 1"  (1.549 m)    Weight:   Wt Readings from Last 1 Encounters:  09/16/17 156 lb 8.4 oz (71 kg)    Ideal Body Weight:  56.8 kg  BMI:  Body mass index is 29.58 kg/m.  Estimated Nutritional  Needs:   Kcal:  1400-1600  Protein:  70-80g  Fluid:  1.6L/day  Clayton Bibles, MS, RD, LDN [place] Dietitian Pager: [phone] After Hours Pager: [phone]

## 2017-10-05 LAB — BASIC METABOLIC PANEL
Anion gap: 7 (ref 5–15)
BUN: 5 mg/dL — ABNORMAL LOW (ref 6–20)
CALCIUM: 8.8 mg/dL — AB (ref 8.9–10.3)
CO2: 23 mmol/L (ref 22–32)
CREATININE: 0.93 mg/dL (ref 0.44–1.00)
Chloride: 112 mmol/L — ABNORMAL HIGH (ref 101–111)
GFR calc Af Amer: >60 mL/min (ref >60)
GFR, EST NON AFRICAN AMERICAN: 53 mL/min — AB (ref >60)
Glucose, Bld: 125 mg/dL — ABNORMAL HIGH (ref 65–99)
Potassium: 3.7 mmol/L (ref 3.5–5.1)
SODIUM: 142 mmol/L (ref 135–145)

## 2017-10-05 LAB — GLUCOSE, CAPILLARY
GLUCOSE-CAPILLARY: 124 mg/dL — AB (ref 65–99)
GLUCOSE-CAPILLARY: 181 mg/dL — AB (ref 65–99)
Glucose-Capillary: 145 mg/dL — ABNORMAL HIGH (ref 65–99)
Glucose-Capillary: 145 mg/dL — ABNORMAL HIGH (ref 65–99)

## 2017-10-05 LAB — CBC WITH DIFFERENTIAL/PLATELET
BASOS ABS: 0 10*3/uL (ref 0.0–0.1)
Basophils Relative: 0 %
EOS ABS: 0.2 10*3/uL (ref 0.0–0.7)
EOS PCT: 5 %
HCT: 25.1 % — ABNORMAL LOW (ref 36.0–46.0)
Hemoglobin: 8 g/dL — ABNORMAL LOW (ref 12.0–15.0)
Lymphocytes Relative: 30 %
Lymphs Abs: 1.1 10*3/uL (ref 0.7–4.0)
MCH: 32.9 pg (ref 26.0–34.0)
MCHC: 31.9 g/dL (ref 30.0–36.0)
MCV: 103.3 fL — ABNORMAL HIGH (ref 78.0–100.0)
Monocytes Absolute: 0.5 10*3/uL (ref 0.1–1.0)
Monocytes Relative: 14 %
Neutro Abs: 1.8 10*3/uL (ref 1.7–7.7)
Neutrophils Relative %: 51 %
PLATELETS: 129 10*3/uL — AB (ref 150–400)
RBC: 2.43 MIL/uL — AB (ref 3.87–5.11)
RDW: 15.4 % (ref 11.5–15.5)
WBC: 3.6 10*3/uL — AB (ref 4.0–10.5)

## 2017-10-05 MED ORDER — TORSEMIDE 20 MG PO TABS
20.0000 mg | ORAL_TABLET | Freq: Every day | ORAL | Status: DC
  Administered 2017-10-05 – 2017-10-06 (×2): 20 mg via ORAL
  Filled 2017-10-05 (×4): qty 1

## 2017-10-05 MED ORDER — ALPRAZOLAM 0.25 MG PO TABS: 0.2500 mg | ORAL_TABLET | Freq: Two times a day (BID) | ORAL | Status: DC | PRN

## 2017-10-05 MED ORDER — MAGNESIUM HYDROXIDE 400 MG/5ML PO SUSP
30.0000 mL | Freq: Once | ORAL | Status: AC
  Administered 2017-10-05: 30 mL via ORAL
  Filled 2017-10-05: qty 30

## 2017-10-05 NOTE — Progress Notes (Addendum)
[facility] Progress Note  8 Days Post-Op  Subjective: CC-  Daughter at bedside. States that Maria Christian did great with her diet yesterdayyesterday. She ate a soft diet for lunch and tolerated well. Denies abdominal pain, nausea, or vomiting. Continues to have a lot of flatus from ostomy, and she is now also putting out stool.  Objective: Vital signs in last 24 hours: Temp:  [98 F (36.7 C)-99.6 F (37.6 C)] 98.8 F (37.1 C) (05/03 0704) Pulse Rate:  [86-97] 88 (05/03 0704) Resp:  [16-18] 16 (05/03 0704) BP: (140-151)/(63-73) 140/73 (05/03 0704) SpO2:  [98 %-100 %] 100 % (05/03 0704) Last BM Date: 10/04/17(ostomy)  Intake/Output from previous day: 05/02 0701 - 05/03 0700 In: -  Out: 560 [Urine:500; Stool:60] Intake/Output this shift: Total I/O In: -  Out: 600 [Urine:600]  PE: Gen: Alert, drowsy but responds to questions,NAD HEENT: EOM's intact, pupils equal and round Card:Irregularly irregular Pulm:CTAB, no W/R/R, effort normal Abd: Soft,nondistended,nontender,+BS,midlineincision C/D/I withstaples intact and no erythema or drainage, ostomy pink/edematouswithairand liquid brown stool in bag  Lab Results:  Recent Labs    10/04/17 0358 10/05/17 0413  WBC 3.8* 3.6*  HGB 8.5* 8.0*  HCT 26.0* 25.1*  PLT 132* 129*   BMET Recent Labs    10/04/17 0358 10/05/17 0413  NA 141 142  K 3.6 3.7  CL 111 112*  CO2 22 23  GLUCOSE 128* 125*  BUN <5* 5*  CREATININE 0.85 0.93  CALCIUM 8.8* 8.8*   PT/INR No results for input(s): LABPROT, INR in the last 72 hours. CMP     Component Value Date/Time   NA 142 10/05/2017 0413   NA 144 07/10/2017   K 3.7 10/05/2017 0413   CL 112 (H) 10/05/2017 0413   CO2 23 10/05/2017 0413   GLUCOSE 125 (H) 10/05/2017 0413   BUN 5 (L) 10/05/2017 0413   BUN 11 07/10/2017   CREATININE 0.93 10/05/2017 0413   CALCIUM 8.8 (L) 10/05/2017 0413   PROT 5.3 (L) 09/30/2017 0840   ALBUMIN 2.4 (L) 09/30/2017 0840   AST  13 (L) 09/30/2017 0840   ALT 7 (L) 09/30/2017 0840   ALKPHOS 69 09/30/2017 0840   BILITOT 0.7 09/30/2017 0840   GFRNONAA 53 (L) 10/05/2017 0413   GFRAA >60 10/05/2017 0413   Lipase     Component Value Date/Time   LIPASE 24 05/09/2017 1705       Studies/Results: No results found.  Anti-infectives: Anti-infectives (From admission, onward)   Start     Dose/Rate Route Frequency Ordered Stop   09/28/17 0400  cefoTEtan (CEFOTAN) 2 g in sodium chloride 0.9 % 100 mL IVPB     2 g 200 mL/hr over 30 Minutes Intravenous Every 12 hours 09/27/17 1901 09/28/17 0648   09/27/17 1200  cefoTEtan (CEFOTAN) 2 g in sodium chloride 0.9 % 100 mL IVPB  Status:  Discontinued     2 g 200 mL/hr over 30 Minutes Intravenous On call to O.R. 09/27/17 0941 09/27/17 1705       Assessment/Plan Atrial fibrillation - not on anticoagulants Dementia CKD Chronic diastolic heart failure DNR/DNI Anemia - Hg 8.0,stable AKI -improved,Cr0.93.  Recurrent sigmoid volvulus S/psigmoid colectomy with Hartmann procedure and end colostomy4/25 Dr. Hassell Done -POD8 - path:COLON [place] (0/4),NO EVIDENCE OF MALIGNANCY - ostomy functioning, tolerating soft diet (60cc stool in 24hr)  ID -cefotetan perioperative FEN -IVF,soft diet, Boost, miralax BID VTE -SCDs,lovenox Foley -wick Follow up -Dr. Hassell Done  Plan-Tolerating soft diet  and ostomy functioning. Give 1 dose milk of magnesia and continue bowel regimen as above. If ostomy output increases patient will be stable for d/c to SNF tomorrow from surgical standpoint.  Will arrange follow up in our office for staple removal.   LOS: 19 days    Maria Christian , [facility] 10/05/2017, 8:30 AM Pager: [phone] Consults: [phone] Mon-Fri 7:00 am-4:30 pm Sat-Sun 7:00 am-11:30 am

## 2017-10-05 NOTE — Progress Notes (Signed)
Patient will return to [facility]. The facility has ordered colostomy materials for patient care.  CSW will continue to follow for needs.   Kathrin Greathouse, Latanya Presser, MSW Clinical Social Worker  [phone] 10/05/2017  2:41 PM

## 2017-10-05 NOTE — Progress Notes (Signed)
PROGRESS NOTE    Maria Christian  UVO:[id number] DOB: Jun 15, 1924 DOA: 09/15/2017 PCP: [name], DO    Brief Narrative:  Pt. with PMH of HTN, A. fib, chronic diastolic CHF, recurrent sigmoid volvulus; admitted on 09/15/2017, presented with complaint of abdominal pain, was found to have sigmoid volvulus.  Initially patient refused surgical correction and GI was consulted.  Underwent decompression x2 with GI without any significant improvement and therefore palliative care was consulted.  After discussion with son and palliative care patient decided to proceed with surgical correction. Currently further plan is to monitor postoperative recovery.     Assessment & Plan:   Principal Problem:   Sigmoid volvulus (HCC) Active Problems:   Chronic diastolic CHF (congestive heart failure) (HCC)   Thrombocytopenia (HCC)   Dementia with behavioral disturbance   Normocytic anemia   Chronic atrial fibrillation (HCC)   Acute kidney injury superimposed on chronic kidney disease (HCC)   GERD (gastroesophageal reflux disease)   Protein-calorie malnutrition, severe   Pressure injury of skin  1. recurrent sigmoid volvulus. S/P decompression x2 by GI. On 09/17/2017 and 09/18/2017. Patient subsequently underwent colonoscopy there were some areas of ulceration in the colon concerning for ischemia no malignancy. Repeat x-ray on 4/22 shows persistent sigmoid volvulus. General surgery subsequently consulted.  Patient underwent colectomy with [facility] procedure and colostomy.  Colostomy with air in bag and small amount of brown stool.  Patient tolerating soft diet. Per general surgery.  2.  Acute kidney injury. Prerenal etiology.  Improved with hydration.  Creatinine currently at 0.93.  Creatinine on admission was 2.1.  Baseline creatinine approximately 1.  Saline lock IV fluids.  Resume home dose Lasix.   3.  Hypokalemia. Keep potassium greater than 4.  Continue home regimen oral potassium  supplementation.  4. Chronic respiratory failure with hypoxia Stable.  On room air.  Resume home dose Lasix.  5. Chronic headache/?? Migraine Ongoing for years, worsening over the past couple of months as per daughter CT scan of the head on 2018 showed no acute abnormalities Patient has an appointment with neurology outpatient on Oct 10, 2017, family requested patient has MRI prior to the visit, Dr Posey Pronto recommended it was currently not indicated. Continue to monitor  6. Chronic atrial fibrillation Currently rate controlled.  Patient likely not a anticoagulation candidate due to increased risk of bleeding and age.  Follow for now.   7. Macrocytic anemia: H/H stable at 8.0.  No overt bleeding.   8. Chronic diastolic heart failure Compensated.  Euvolemic.  Tolerating soft diet.  Asymptomatic.  No hypoxia.  IV fluids have been saline locked.  Resume home dose Lasix.   9. Chronic thrombocytopenia Stable.  Follow.   10. Dementia with behavioral disturbance Stable.  Continue Ambien and Haldol as needed.   11.  Diet-controlled diabetes mellitus Hemoglobin A1c was 6.2 on 06/12/2017.  CBGs range from 124-181.  Continue customized sliding scale insulin.  Outpatient follow-up.     DVT prophylaxis: Lovenox Code Status: DNR Family Communication: Updated patient.  No family at bedside. Disposition Plan: Skilled nursing facility when cleared by general surgery.   Consultants:   Wound care Maryfrances Bunnell, RN  10/02/2017  Palliaitve Care: [name] 7/42/5956  General surgery: Dr. Excell Seltzer 09/20/2017  Dr. Lyndel Safe 09/15/2017  Procedures:   CT abdomen and pelvis 09/15/2017  Abdominal films 09/15/2017, 09/16/2017, 09/17/2017, 09/18/2017, pulse 72,019, 09/20/2017, 09/24/2017  Flexible sigmoidoscopy with decompression 09/15/2017  Colonoscopy and decompression 09/17/2017, 09/18/2017  Laparoscopically assisted sigmoid colectomy with Hartman's procedure  and end colostomy per Dr. Hassell Done  09/27/2017  Antimicrobials:   Cefotan perioperatively 09/27/2017   Subjective: In chair.  Denies chest pain or shortness of breath.  No nausea or emesis.  Tolerating soft diet.  Asking to be placed back in bed.  Objective: Vitals:   10/04/17 1611 10/04/17 2107 10/05/17 0704 10/05/17 0900  BP: (!) 151/63 140/65 140/73   Pulse: 97 86 88   Resp: 18 16 16    Temp: 99.6 F (37.6 C) 98 F (36.7 C) 98.8 F (37.1 C)   TempSrc: Oral Oral Oral   SpO2: 98% 100% 100%   Weight:    71.1 kg (156 lb 12 oz)  Height:        Intake/Output Summary (Last 24 hours) at 10/05/2017 1221 Last data filed at 10/05/2017 1032 Gross per 24 hour  Intake 720 ml  Output 1335 ml  Net -615 ml   Filed Weights   09/15/17 2200 09/16/17 1105 10/05/17 0900  Weight: 71 kg (156 lb 8.4 oz) 71 kg (156 lb 8.4 oz) 71.1 kg (156 lb 12 oz)    Examination:  General exam: NAD Respiratory system: Lungs clear to auscultation bilaterally anterior lung fields.  No crackles, no wheezes, no rhonchi.Marland Kitchen Respiratory effort normal. Cardiovascular system: Irregularly irregular.  No JVD.  No murmurs rubs or gallops.  No lower extremity edema.  Gastrointestinal system: Abdomen is soft, nondistended, nontender, positive bowel sounds.  Staples/incision site clean/dry/intact.  No drainage noted.  Ostomy with air and some brownish stool.  Central nervous system: Alert and oriented. No focal neurological deficits. Extremities: Symmetric 5 x 5 power. Skin: No rashes, lesions or ulcers Psychiatry: Judgement and insight appear normal. Mood & affect appropriate.     Data Reviewed: I have personally reviewed following labs and imaging studies  CBC: Recent Labs  Lab 10/01/17 0724 10/02/17 0424 10/03/17 0402 10/04/17 0358 10/05/17 0413  WBC 3.5* 3.8* 4.0 3.8* 3.6*  NEUTROABS 1.9 2.1 1.9 2.2 1.8  HGB 8.4* 8.9* 8.4* 8.5* 8.0*  HCT 25.9* 27.3* 25.6* 26.0* 25.1*  MCV 104.4* 103.8* 103.6* 102.8* 103.3*  PLT 131* 136* 136* 132* 129*    Basic Metabolic Panel: Recent Labs  Lab 10/01/17 0724 10/02/17 0424 10/03/17 0402 10/04/17 0358 10/05/17 0413  NA 137 142 141 141 142  K 4.0 3.8 4.1 3.6 3.7  CL 115* 117* 115* 111 112*  CO2 18* 20* 20* 22 23  GLUCOSE 128* 172* 124* 128* 125*  BUN 6 5* <5* <5* 5*  CREATININE 1.01* 0.88 0.90 0.85 0.93  CALCIUM 8.3* 8.6* 8.7* 8.8* 8.8*   GFR: Estimated Creatinine Clearance: 36.2 mL/min (by C-G formula based on SCr of 0.93 mg/dL). Liver Function Tests: Recent Labs  Lab 09/30/17 0840  AST 13*  ALT 7*  ALKPHOS 69  BILITOT 0.7  PROT 5.3*  ALBUMIN 2.4*   No results for input(s): LIPASE, AMYLASE in the last 168 hours. No results for input(s): AMMONIA in the last 168 hours. Coagulation Profile: No results for input(s): INR, PROTIME in the last 168 hours. Cardiac Enzymes: No results for input(s): CKTOTAL, CKMB, CKMBINDEX, TROPONINI in the last 168 hours. BNP (last 3 results) No results for input(s): PROBNP in the last 8760 hours. HbA1C: No results for input(s): HGBA1C in the last 72 hours. CBG: Recent Labs  Lab 10/04/17 0747 10/04/17 1350 10/04/17 1728 10/04/17 2108 10/05/17 0754  GLUCAP 115* 142* 146* 154* 124*   Lipid Profile: No results for input(s): CHOL, HDL, LDLCALC, TRIG, CHOLHDL, LDLDIRECT in the last  72 hours. Thyroid Function Tests: No results for input(s): TSH, T4TOTAL, FREET4, T3FREE, THYROIDAB in the last 72 hours. Anemia Panel: No results for input(s): VITAMINB12, FOLATE, FERRITIN, TIBC, IRON, RETICCTPCT in the last 72 hours. Sepsis Labs: No results for input(s): PROCALCITON, LATICACIDVEN in the last 168 hours.  Recent Results (from the past 240 hour(s))  Surgical PCR screen     Status: Abnormal   Collection Time: 09/27/17  9:55 AM  Result Value Ref Range Status   MRSA, PCR NEGATIVE NEGATIVE Final   Staphylococcus aureus POSITIVE (A) NEGATIVE Final    Comment: (NOTE) The Xpert SA Assay (FDA approved for NASAL specimens in patients 36 years of  age and older), is one component of a comprehensive surveillance program. It is not intended to diagnose infection nor to guide or monitor treatment. Performed at [facility], [place] [address] [place],  [address]          Radiology Studies: No results found.      Scheduled Meds: . docusate sodium  100 mg Oral BID  . enoxaparin (LOVENOX) injection  40 mg Subcutaneous Q24H  . famotidine  20 mg Oral BID  . feeding supplement  1 Container Oral BID BM  . insulin aspart  0-15 Units Subcutaneous TID WC  . magnesium hydroxide  30 mL Oral Once  . nutrition supplement (JUVEN)  1 packet Oral BID BM  . polyethylene glycol  17 g Oral BID  . potassium chloride  20 mEq Oral QHS  . potassium chloride  40 mEq Oral Daily  . torsemide  20 mg Oral Daily   Continuous Infusions:    LOS: 19 days    Time spent: 35 minutes    Irine Seal, MD [facility] Pager [phone]  If 7PM-7AM, please contact night-coverage [email or web address] Password [email or web address] 10/05/2017, 12:21 PM

## 2017-10-05 NOTE — Discharge Instructions (Signed)
Colostomy, Adult, Care After Refer to this sheet in the next few weeks. These instructions provide you with information about caring for yourself after your procedure. Your health care provider may also give you more specific instructions. Your treatment has been planned according to current medical practices, but problems sometimes occur. Call your health care provider if you have any problems or questions after your procedure. What can I expect after the procedure? After the procedure, it is common to have:  Swelling at the opening that was created during the procedure (stoma).  Slight bleeding around the stoma.  Redness around the stoma.  Follow these instructions at home: Activity  Rest as needed while the stoma area heals.  Return to your normal activities as told by your health care provider. Ask your health care provider what activities are safe for you.  Avoid strenuous activity and abdominal exercises for 3 weeks or for as long as told by your health care provider.  Do not lift anything that is heavier than 10 lb (4.5 kg). Incision care   Follow instructions from your health care provider about how to take care of your incision. Make sure you: ? Wash your hands with soap and water before you change your bandage (dressing). If soap and water are not available, use hand sanitizer. ? Change your dressing as told by your health care provider. ? Leave stitches (sutures), skin glue, or adhesive strips in place. These skin closures may need to stay in place for 2 weeks or longer. If adhesive strip edges start to loosen and curl up, you may trim the loose edges. Do not remove adhesive strips completely unless your health care provider tells you to do that. Stoma Care  Keep the stoma area clean.  Clean and dry the skin around the stoma each time you change the colostomy bag. To clean the stoma area: ? Use warm water and only use cleansers that are recommended by your health care  provider. ? Rinse the stoma area with plain water. ? Dry the area well.  Use stoma powder or ointment on your skin only as told by your health care provider. Do not use any other powders, gels, wipes, or creams on your skin.  Check the stoma area every day for signs of infection. Check for: ? More redness, swelling, or pain. ? More fluid or blood. ? Pus or warmth.  Measure the stoma opening regularly and record the size. Watch for changes. Share this information with your health care provider. Bathing  Do not take baths, swim, or use a hot tub until your health care provider approves. Ask your health care provider if you can take showers. You may be able to shower with or without the colostomy bag in place. If you bathe with the bag on, dry the bag afterward.  Avoid using harsh or oily soaps when you bathe. Colostomy Bag Care  Follow instructions from your health care provider about how to empty or change the colostomy bag.  Keep colostomy supplies with you at all times.  Store all supplies in a cool, dry place.  Empty the colostomy bag: ? Whenever it is one-third to one-half full. ? At bedtime.  Replace the bag every 2-4 days or as told by your health care provider. Driving  Do not drive for 24 hours if you received a sedative.  Do not drive or operate heavy machinery while taking prescription pain medicine. General instructions  Follow instructions from your health care provider about eating  or drinking restrictions.  Take over-the-counter and prescription medicines only as told by your health care provider.  Avoid wearing clothes that are tight directly over your stoma.  Do not use any tobacco products, such as cigarettes, chewing tobacco, and e-cigarettes. If you need help quitting, ask your health care provider.  (Women) Ask your health care provider about becoming pregnant and about using birth control. Medicines may not be absorbed normally after the  procedure.  Keep all follow-up visits as told by your health care provider. This is important. Contact a health care provider if:  You are having trouble caring for your stoma or changing the colostomy bag.  You feel nauseous or you vomit.  You have a fever.  You havemore redness, swelling, or pain at the site of your stoma or around your anus.  You have more fluid or blood coming from your stoma or your anus.  Your stoma area feels warm to the touch.  You have pus coming from your stoma.  You notice a change in the size or appearance of the stoma.  You have abdominal pain, bloating, pressure, or cramping.  Your have stool more often or less often than your health care provider tells you to expect.  You are not making much urine. This may be a sign of dehydration. Get help right away if:  Your abdominal pain does not go away or it becomes severe.  You keep vomiting.  Your stool is not draining through the stoma.  You have chest pain or an irregular heartbeat. This information is not intended to replace advice given to you by your health care provider. Make sure you discuss any questions you have with your health care provider. Document Released: 10/12/2010 Document Revised: 09/30/2015 Document Reviewed: 02/02/2015 Elsevier Interactive Patient Education  2018 [employer].

## 2017-10-06 DIAGNOSIS — E43 Unspecified severe protein-calorie malnutrition: Secondary | ICD-10-CM

## 2017-10-06 DIAGNOSIS — D696 Thrombocytopenia, unspecified: Secondary | ICD-10-CM

## 2017-10-06 DIAGNOSIS — E1142 Type 2 diabetes mellitus with diabetic polyneuropathy: Secondary | ICD-10-CM

## 2017-10-06 LAB — BASIC METABOLIC PANEL
ANION GAP: 10 (ref 5–15)
BUN: 8 mg/dL (ref 6–20)
CO2: 26 mmol/L (ref 22–32)
Calcium: 8.5 mg/dL — ABNORMAL LOW (ref 8.9–10.3)
Chloride: 103 mmol/L (ref 101–111)
Creatinine, Ser: 1.15 mg/dL — ABNORMAL HIGH (ref 0.44–1.00)
GFR calc Af Amer: 47 mL/min — ABNORMAL LOW (ref >60)
GFR calc non Af Amer: 41 mL/min — ABNORMAL LOW (ref >60)
GLUCOSE: 119 mg/dL — AB (ref 65–99)
Potassium: 3.6 mmol/L (ref 3.5–5.1)
Sodium: 139 mmol/L (ref 135–145)

## 2017-10-06 LAB — CBC
HCT: 24.7 % — ABNORMAL LOW (ref 36.0–46.0)
Hemoglobin: 8.1 g/dL — ABNORMAL LOW (ref 12.0–15.0)
MCH: 34 pg (ref 26.0–34.0)
MCHC: 32.8 g/dL (ref 30.0–36.0)
MCV: 103.8 fL — AB (ref 78.0–100.0)
PLATELETS: 116 10*3/uL — AB (ref 150–400)
RBC: 2.38 MIL/uL — ABNORMAL LOW (ref 3.87–5.11)
RDW: 15.5 % (ref 11.5–15.5)
WBC: 3.7 10*3/uL — AB (ref 4.0–10.5)

## 2017-10-06 LAB — GLUCOSE, CAPILLARY
Glucose-Capillary: 130 mg/dL — ABNORMAL HIGH (ref 65–99)
Glucose-Capillary: 106 mg/dL — ABNORMAL HIGH (ref 65–99)

## 2017-10-06 MED ORDER — ACETAMINOPHEN 500 MG PO TABS: 1000.0000 mg | ORAL_TABLET | Freq: Three times a day (TID) | ORAL | 0 refills | Status: DC | PRN

## 2017-10-06 MED ORDER — TRAMADOL HCL 50 MG PO TABS: 25.0000 mg | ORAL_TABLET | Freq: Four times a day (QID) | ORAL | 0 refills | Status: DC | PRN

## 2017-10-06 MED ORDER — JUVEN PO PACK: 1.0000 | PACK | Freq: Two times a day (BID) | ORAL | 0 refills | Status: DC

## 2017-10-06 MED ORDER — POLYETHYLENE GLYCOL 3350 17 G PO PACK: 17.0000 g | PACK | Freq: Two times a day (BID) | ORAL | 0 refills | Status: AC

## 2017-10-06 MED ORDER — ZOLPIDEM TARTRATE 5 MG PO TABS: 5.0000 mg | ORAL_TABLET | Freq: Every evening | ORAL | 0 refills | Status: DC | PRN

## 2017-10-06 MED ORDER — ORAL CARE MOUTH RINSE
15.0000 mL | Freq: Two times a day (BID) | OROMUCOSAL | Status: DC
  Administered 2017-10-06: 15 mL via OROMUCOSAL

## 2017-10-06 MED ORDER — METHOCARBAMOL 500 MG PO TABS: 500.0000 mg | ORAL_TABLET | Freq: Three times a day (TID) | ORAL | 0 refills | Status: AC | PRN

## 2017-10-06 MED ORDER — ALPRAZOLAM 0.25 MG PO TABS: 0.2500 mg | ORAL_TABLET | Freq: Two times a day (BID) | ORAL | 0 refills | Status: DC | PRN

## 2017-10-06 MED ORDER — POTASSIUM CHLORIDE CRYS ER 20 MEQ PO TBCR
40.0000 meq | EXTENDED_RELEASE_TABLET | Freq: Once | ORAL | Status: AC
  Administered 2017-10-06: 40 meq via ORAL
  Filled 2017-10-06: qty 2

## 2017-10-06 NOTE — Progress Notes (Signed)
Pt. left with PTAR via stretcher, no respiratory distress noted. Pt. discharged to [place].

## 2017-10-06 NOTE — Progress Notes (Signed)
Report called and given to Solicitor, at [employer]. Discharge paperwork completed and put in folder. Daughter at bedside, awaiting transportation.

## 2017-10-06 NOTE — Discharge Summary (Signed)
Physician Discharge Summary  Maria Christian BJS:[id number] DOB: 1925-01-28 DOA: 09/15/2017  PCP: Maria Cranker, DO  Admit date: 09/15/2017 Discharge date: 10/06/2017  Time spent: 60 minutes  Recommendations for Outpatient Follow-up:  1. Follow-up with general surgery 10/10/2017. 2. Follow-up with Dr. Hassell Done, general surgery 10/24/2017 3. Follow-up with MD at skilled nursing facility.  Patient will need basic metabolic profile done in 1 week to follow-up on electrolytes and renal function.   Discharge Diagnoses:  Principal Problem:   Sigmoid volvulus ([place]) Active Problems:   Chronic diastolic CHF (congestive heart failure) (HCC)   Thrombocytopenia (HCC)   Dementia with behavioral disturbance   Normocytic anemia   Chronic atrial fibrillation (HCC)   Acute kidney injury superimposed on chronic kidney disease (HCC)   GERD (gastroesophageal reflux disease)   Protein-calorie malnutrition, severe   Pressure injury of skin   Discharge Condition: Stable and improved.  Diet recommendation: Carb modified diet.  Filed Weights   09/16/17 1105 10/05/17 0900 10/06/17 0650  Weight: 71 kg (156 lb 8.4 oz) 71.1 kg (156 lb 12 oz) 68.5 kg (151 lb 0.2 oz)    History of present illness:  Per Dr. Rhoderick Christian is a 84 y.o. female with medical history significant of HTN, A. fib, diastolic CHF, recurrent sigmoid volvulus, chronic respiratory failure, peripheral neuropathy, and depression; who presented with complaints of abdominal pain, distention, nausea, and vomiting for the last 3 days.  Much of history is obtained from the patient's daughter who is present at bedside.  At baseline patient has issues with ambulation and is wheelchair-bound.  She is unsure of when the patient last had a bowel movement.  The patient has had to be admitted for volvulus of her intestines on 4-5 other separate occasions requiring decompression since 2014.  The patient's son has not wanted the patient to undergo any  surgical intervention.  Associated symptoms include complaints of right lower extremity swelling that is chronic and severe right-sided headaches with radiation pain down her neck.  At the nursing facility they have giving her Excedrin migraine medication without relief of symptoms.  Daughter makes note that her veins pop out of her head and she is in need of imaging of her brain.   ED Course: Upon admission into the emergency department patient was noted to be afebrile, pulse 65-93, respirations 16-28, blood pressure 87/52 132/65, and O2 saturations 91-100% on 2-3L.  Labs revealed hemoglobin 10.7, platelets 104, BUN 43, creatinine 2.17.  Imaging studies revealed acute sigmoid volvulus.  A nasogastric tube was placed and the patient was given 1 L of normal saline IV fluids..  Dr. Lyndel Christian performed emergent bedside sigmoidoscopy for decompression.  Patient currently denies having any pain complaints    Hospital Course:  1. recurrent sigmoid volvulus. S/P decompression x2 by GI. On 09/17/2017 and 09/18/2017. Patient subsequently underwent colonoscopy there were some areas of ulceration in the colon concerning for ischemia no malignancy. Repeat x-ray on 4/22 showed persistent sigmoid volvulus. General surgery subsequently consulted.  Patient underwent colectomy with [facility] procedure and colostomy.  Patient was subsequently started on clear liquids which he tolerated.  Diet was advanced to a soft diet which he tolerated.  Colostomy initially had antibiotic and subsequently as patient improved clinically stool was noted in colostomy bag.  Patient remained asymptomatic.  Outpatient follow-up with general surgery.  2. Acute kidney injury. Prerenal etiology.  Improved with hydration.  Patient diuretics were initially held on admission.  As patient improved clinically diuretics were resumed.  Renal function remained stable.   3. Hypokalemia. Potassium repleted.  Patient was resumed back on her home  regimen of oral potassium supplementation once her diuretics were resumed.  4. Chronic respiratory failure with hypoxia Remained stable.  Diuretics were initially held on admission and subsequently resumed once patient tolerating a diet.  Outpatient follow-up.    5. Chronic headache/?? Migraine Ongoing for years, worsening over the past couple of months as per daughter CT scan of the head on 2018 showed no acute abnormalities Patient has an appointment with neurology outpatient on Oct 10, 2017,family requestedpatient has MRI prior to the visit, Dr Maria Christian recommended it wascurrently not indicated. Continueto monitor  6. Chronic atrial fibrillation Remained rate controlled during the hospitalization.  Patient likely not a anticoagulation candidate due to increased risk of bleeding and age.    7. Macrocytic anemia: H/H remained stable throughout the hospitalization.    8. Chronic diastolic heart failure Compensated.  Euvolemic.  Patient was n.p.o. patient's diuretics were held.  As patient improved clinically and diet was advanced she was resumed back on home dose Lasix.  Outpatient follow-up.   9. Chronic thrombocytopenia Stable.  Follow.   10. Dementia with behavioral disturbance Stable.  Patient maintained on Ambien and Haldol as needed.   11.  Diet-controlled diabetes mellitus Hemoglobin A1c was 6.2 on 06/12/2017.  CBGs main stable.  Patient was maintained on a customized sliding scale.  Patient will be discharged back on a carb modified diet.      Procedures:  CT abdomen and pelvis 09/15/2017  Abdominal films 09/15/2017, 09/16/2017, 09/17/2017, 09/18/2017, pulse 72,019, 09/20/2017, 09/24/2017  Flexible sigmoidoscopy with decompression 09/15/2017  Colonoscopy and decompression 09/17/2017, 09/18/2017  Laparoscopically assisted sigmoid colectomy with Hartman's procedure and end colostomy per Dr. Hassell Done 09/27/2017      Consultations:  Wound care Maryfrances Bunnell, RN   10/02/2017  [facility] Care: [name] 5/73/2202  General surgery: Dr. Excell Seltzer 09/20/2017  Dr. Lyndel Christian 09/15/2017      Discharge Exam: Vitals:   10/06/17 0650 10/06/17 1333  BP: 130/63 139/60  Pulse: 90 89  Resp: 20   Temp: 98.1 F (36.7 C) 98.8 F (37.1 C)  SpO2: 99% 100%    General: NAD Cardiovascular: RRR Respiratory: CTAB  Discharge Instructions   Discharge Instructions    Diet Carb Modified   Complete by:  As directed    Increase activity slowly   Complete by:  As directed      Allergies as of 10/06/2017      Reactions   Aspirin Other (See Comments)   G.I. Upset only      Medication List    STOP taking these medications   docusate sodium 100 MG capsule Commonly known as:  COLACE     TAKE these medications   acetaminophen 500 MG tablet Commonly known as:  TYLENOL Take 2 tablets (1,000 mg total) by mouth 3 (three) times daily as needed for mild pain or moderate pain.   ALPRAZolam 0.25 MG tablet Commonly known as:  XANAX Take 1 tablet (0.25 mg total) by mouth 2 (two) times daily as needed for anxiety.   amLODipine 2.5 MG tablet Commonly known as:  NORVASC Take 2.5 mg by mouth every morning. Hold for BP < 100/60 or HR < 60   aspirin-acetaminophen-caffeine 250-250-65 MG tablet Commonly known as:  EXCEDRIN MIGRAINE Take 2 tablets by mouth every 12 (twelve) hours as needed for migraine.   AZO TABS PO Take 1 tablet by mouth at bedtime.   baclofen  10 MG tablet Commonly known as:  LIORESAL Take 5 mg by mouth at bedtime. Take 1/2 of a 10mg  tablet.   calcium carbonate 500 MG chewable tablet Commonly known as:  TUMS - dosed in mg elemental calcium Chew 2 tablets by mouth 3 (three) times daily.   CERTAGEN PO Take 1 tablet by mouth every evening. What changed:  Another medication with the same name was removed. Continue taking this medication, and follow the directions you see here.   CHLORASEPTIC 6-10 MG lozenge Generic drug:   benzocaine-menthol Take 1 lozenge by mouth as needed for sore throat.   Cranberry 475 MG Caps Take 1 capsule by mouth 2 (two) times daily.   diclofenac sodium 1 % Gel Commonly known as:  VOLTAREN Apply 2 grams to neck topically three times a day for pain   dorzolamide 2 % ophthalmic solution Commonly known as:  TRUSOPT Place 1 drop into both eyes 3 (three) times daily.   escitalopram 10 MG tablet Commonly known as:  LEXAPRO Take 10 mg by mouth daily.   ferrous sulfate 220 (44 Fe) MG/5ML solution Give 5 ml by mouth in the evening for supplement   fluticasone 50 MCG/ACT nasal spray Commonly known as:  FLONASE Place 2 sprays into both nostrils at bedtime.   gabapentin 100 MG capsule Commonly known as:  NEURONTIN Take 100 mg by mouth at bedtime.   ipratropium-albuterol 0.5-2.5 (3) MG/3ML Soln Commonly known as:  DUONEB Take 3 mLs by nebulization every 6 (six) hours as needed (wheezing/SOB for 14 days).   LINZESS 145 MCG Caps capsule Generic drug:  linaclotide Take 145 mcg by mouth daily before breakfast.   loratadine 10 MG tablet Commonly known as:  CLARITIN Take 10 mg by mouth daily before breakfast.   methocarbamol 500 MG tablet Commonly known as:  ROBAXIN Take 1 tablet (500 mg total) by mouth 3 (three) times daily as needed for muscle spasms.   nutrition supplement (JUVEN) Pack Take 1 packet by mouth 2 (two) times daily between meals. Start taking on:  10/07/2017   nystatin cream Commonly known as:  MYCOSTATIN Apply 1 application topically 2 (two) times daily. Apply to buttocks topically two times a day for redness What changed:  Another medication with the same name was removed. Continue taking this medication, and follow the directions you see here.   omeprazole 20 MG capsule Commonly known as:  PRILOSEC Take 20 mg by mouth daily.   ondansetron 8 MG tablet Commonly known as:  ZOFRAN Take 8 mg by mouth every 8 (eight) hours as needed for nausea or vomiting.    polyethylene glycol packet Commonly known as:  MIRALAX / GLYCOLAX Take 17 g by mouth 2 (two) times daily. What changed:  when to take this   potassium chloride SA 20 MEQ tablet Commonly known as:  K-DUR,KLOR-CON Take 20-40 mEq by mouth 2 (two) times daily. Give 40 meq (2 tablets) in the morning and Give 20 meq (1 tablet) by mouth in the afternoon.   senna 8.6 MG Tabs tablet Commonly known as:  SENOKOT Take 1 tablet (8.6 mg total) by mouth 2 (two) times daily.   simethicone 125 MG chewable tablet Commonly known as:  MYLICON Chew 756 mg by mouth every 6 (six) hours as needed for flatulence.   sodium chloride 0.65 % Soln nasal spray Commonly known as:  OCEAN Place 1 spray into both nostrils as directed. Four times daily And every 24 hours as needed to moisturize nasal passages.  torsemide 20 MG tablet Commonly known as:  DEMADEX Take 20 mg by mouth daily.   traMADol 50 MG tablet Commonly known as:  ULTRAM Take 0.5 tablets (25 mg total) by mouth every 6 (six) hours as needed for moderate pain (if tylenol and oxycodone ineffective).   Travoprost (BAK Free) 0.004 % Soln ophthalmic solution Commonly known as:  TRAVATAN Place 1 drop into both eyes daily at 8 pm.   vitamin B-12 500 MCG tablet Commonly known as:  CYANOCOBALAMIN Take 500 mcg by mouth every evening.   zolpidem 5 MG tablet Commonly known as:  AMBIEN Take 1 tablet (5 mg total) by mouth at bedtime as needed for sleep.      Allergies  Allergen Reactions  . Aspirin Other (See Comments)    [name]    Contact information for follow-up providers    [facility], [place]. Go on 10/10/2017.   Specialty:  General Surgery Why:  Your appointment is 10/10/17 at 2:30PM with one of our nurses to have your staples removed. Please arrive 30 minutes prior to your appointment to check in and fill out paperwork. Bring photo ID and insurance information. Contact information: [address] [place] [phone]       Dr. Johnathan Hausen. Go on 10/24/2017.   Why:  Your appointment is 10/24/17 at 10:00AM with Dr. Hassell Done to follow up from your recent surgery. Please arrive 15 minutes prior to your appointment to check in. Contact information: [facility] [place], [place]   Telephone #: [phone]        MD AT SNF Follow up.            Contact information for after-discharge care    [facility] .   Service:  Skilled Nursing Contact information: [address] [place] [phone]                   The results of significant diagnostics from this hospitalization (including imaging, microbiology, ancillary and laboratory) are listed below for reference.    Significant Diagnostic Studies: Ct Abdomen Pelvis Wo Contrast  Result Date: 09/15/2017 CLINICAL DATA:  Nausea, vomiting, high grade bowel obstruction, abdominal distension for 48 hours, history of chronic kidney disease, diabetes mellitus, atrial fibrillation, hypertension, Parkinson's, CHF, coronary artery disease EXAM: CT ABDOMEN AND PELVIS WITHOUT CONTRAST TECHNIQUE: Multidetector CT imaging of the abdomen and pelvis was performed following the standard protocol without IV contrast. Sagittal and coronal MPR images reconstructed from axial data set. No oral contrast was administered COMPARISON:  06/28/2017 FINDINGS: Lower chest: Bibasilar atelectasis. Hepatobiliary: Gallbladder surgically absent.  Liver unremarkable. Pancreas: Atrophic pancreas without obvious mass Spleen: Normal appearance Adrenals/Urinary Tract: Horseshoe kidney. Minimally prominent extrarenal pelves without definite hydronephrosis or hydroureter. Bladder unremarkable. Stomach/Bowel: Appendix not visualized but no pericecal inflammatory process seen. Marked gaseous  distention of the sigmoid loop with tapered beak-like appearance of the sigmoid colon in the central pelvis consistent with sigmoid volvulus. Classic appearance on scout image. Sigmoid colon measures up to 11.7 cm transverse. Mild edema of sigmoid mesocolon. More proximally diffuse dilatation of the colon is identified to the cecal tip. Small bowel loops decompressed. No colonic wall thickening or evidence of perforation. Stomach unremarkable. Vascular/Lymphatic: Extensive atherosclerotic calcifications aorta as well as visceral, iliac, and coronary arteries. Aorta normal caliber. No adenopathy. Mitral annular calcification. Heart enlarged.  Reproductive: Uterus surgically absent. Ovaries not visualized. No free air free fluid. Other: No free air or free fluid.  No hernia. Musculoskeletal: Diffuse osseous demineralization. LEFT hip prosthesis with associated beam hardening artifacts in pelvis. IMPRESSION: Sigmoid volvulus with significant gaseous distention of the sigmoid colon tapering to a beak-like appearance at the distal sigmoid colon. Associated edema of the sigmoid mesocolon and proximal colonic obstruction/dilatation. Sigmoid colon up to 11.7 cm diameter without evidence of perforation. Extensive atherosclerotic calcification including coronary arteries. Horseshoe kidney. Electronically Signed   By: Lavonia Dana M.D.   On: 09/15/2017 17:27   Dg Abd 1 View  Result Date: 09/20/2017 CLINICAL DATA:  Follow-up distension EXAM: ABDOMEN - 1 VIEW COMPARISON:  Abdominal x-rays dated 09/19/2017, 09/18/2017 and 09/17/2017. FINDINGS: There is again prominent distension of the sigmoid colon, similar to appearance on abdominal x-rays of 09/18/2017 and 09/17/2017, appeared improved on intervening exams of 09/18/2016 and 09/19/2017, now again most suggestive of sigmoid volvulus. No evidence of free intraperitoneal air or abnormal fluid collection. No acute or suspicious osseous finding IMPRESSION: Findings on today's  study are again compatible with sigmoid volvulus, with prominent distension of the sigmoid colon, today's appearance similar to earlier plain film exams of 09/18/2017 and 09/17/2017. The distension had improved on intervening exams of 09/18/2017 and 09/19/2017 (sigmoid volvulus recurrence?). These results will be called to the ordering clinician or representative by the Radiologist Assistant, and communication documented in the PACS or zVision Dashboard. Electronically Signed   By: Franki Cabot M.D.   On: 09/20/2017 15:59   Dg Abd 1 View  Result Date: 09/19/2017 CLINICAL DATA:  History of volvulus EXAM: ABDOMEN - 1 VIEW COMPARISON:  09/18/2017 FINDINGS: Scattered large and small bowel gas is noted. Some air is noted within the sigmoid slightly increased from the prior exam although no obstructive changes are seen. No free air is noted. Stable degenerative change of the lumbar spine is seen. IMPRESSION: Slight increase in distention of the sigmoid without obstructive change. Electronically Signed   By: Inez Catalina M.D.   On: 09/19/2017 07:20   Dg Abd 1 View  Result Date: 09/18/2017 CLINICAL DATA:  Sigmoid colon volvulus EXAM: ABDOMEN - 1 VIEW COMPARISON:  09/18/2017 FINDINGS: Interval decompression of the dilated loop of sigmoid colon seen on comparison exam. No intraperitoneal or retroperitoneal free air. IMPRESSION: Reduction/decompression of gas distended sigmoid colon. Electronically Signed   By: Suzy Bouchard M.D.   On: 09/18/2017 20:01   Dg Abd 1 View  Addendum Date: 09/18/2017   ADDENDUM REPORT: 09/18/2017 13:28 ADDENDUM: Correction of the findings: The second sentence should read "the cecum appears to be medially positioned, and these findings again are consistent with sigmoid volvulus when compared to the prior CT which is unchanged. " Electronically Signed   By: Ivar Drape M.D.   On: 09/18/2017 13:28   Result Date: 09/18/2017 CLINICAL DATA:  History of volvulus EXAM: ABDOMEN - 1 VIEW  COMPARISON:  Abdomen films of 09/17/2017 and CT abdomen pelvis of 09/15/2016 FINDINGS: The present gaseous pattern is similar to the CT scout film with gaseous distention of the right colon and transverse colon. The cecum appears to be medially positioned, and these findings again are consistent with sickle lobular is which is unchanged. No definite mucosal edema or pneumatosis is seen. Surgical clips are present in the right quadrant from prior cholecystectomy. Left hip replacement is present. IMPRESSION: Bowel-gas pattern again is consistent with sigmoid volvulus when compared to the scout film from CT of the  abdomen recently. Electronically Signed: By: Ivar Drape M.D. On: 09/18/2017 13:12   Dg Abd 2 Views  Result Date: 09/17/2017 CLINICAL DATA:  Follow-up sigmoid colon volvulus EXAM: ABDOMEN - 2 VIEW COMPARISON:  09/16/2017 FINDINGS: Prior cholecystectomy. NG tube has been removed. Marked gaseous distention of the colon has slightly increased since prior study. This is most pronounced in the sigmoid colon region. No free air organomegaly. IMPRESSION: Marked gaseous distention of the colon, particularly sigmoid colon, increasing since prior study compatible with sigmoid volvulus. Removal of NG tube. Electronically Signed   By: Rolm Baptise M.D.   On: 09/17/2017 09:07   Dg Abd Portable 1v  Result Date: 09/16/2017 CLINICAL DATA:  Nasogastric tube placement.  Sigmoid volvulus. EXAM: PORTABLE ABDOMEN - 1 VIEW COMPARISON:  09/15/2017 FINDINGS: Nasogastric tube is seen with tip in the proximal stomach. Moderate colonic dilatation again seen, but mildly decreased since previous study. IMPRESSION: Nasogastric tube in appropriate position. Mild decrease in colonic dilatation. Electronically Signed   By: Earle Gell M.D.   On: 09/16/2017 07:52   Dg Abd Portable 1 View  Result Date: 09/15/2017 CLINICAL DATA:  Nasogastric tube placement EXAM: PORTABLE ABDOMEN - 1 VIEW COMPARISON:  Portable exam 1941 hours  compared to 1836 hours as well as earlier CT of 09/15/2017 FINDINGS: Nasogastric tube coiled in proximal stomach. Again identified marked gaseous distention of the sigmoid loop compatible with sigmoid volvulus. Mild gas distention of remainder of colon. Bones demineralized. IMPRESSION: Nasogastric tube coiled in proximal stomach. Marked gaseous distention of sigmoid loop consistent with sigmoid volvulus again identified. Electronically Signed   By: Lavonia Dana M.D.   On: 09/15/2017 20:18   Dg Abd Portable 1 View  Result Date: 09/15/2017 CLINICAL DATA:  Nasogastric tube placement EXAM: PORTABLE ABDOMEN - 1 VIEW COMPARISON:  CT abdomen and pelvis September 15, 2017 FINDINGS: Nasogastric tube tip is in the proximal stomach with the side port at the gastroesophageal junction. Evidence of sigmoid volvulus is again noted with multiple loops of dilated colon. There is marked dilatation of the sigmoid colon which is located in the left upper to mid abdomen. No free air. There is a total hip replacement on the left. IMPRESSION: Nasogastric tube tip in the proximal stomach with side port at gastroesophageal junction. Advise advancing nasogastric tube approximately 8 cm to insure that both tube tip and side port are well within the stomach. Colonic dilatation with evidence of sigmoid volvulus, unchanged from earlier in the day. No free air. Electronically Signed   By: Lowella Grip III M.D.   On: 09/15/2017 19:03   Dg Abd Portable 2v  Result Date: 09/24/2017 CLINICAL DATA:  Abdominal distension EXAM: PORTABLE ABDOMEN - 2 VIEW COMPARISON:  Portable exam 0513 hours compared to 09/20/2017 FINDINGS: Again identified gaseous distention of the sigmoid colon consistent with sigmoid volvulus, little changed. Sigmoid colon measures up to 11.2 cm transverse. Air-filled nondistended proximal colon than small bowel. Question minimal bowel wall thickening of the sigmoid colon. Bones demineralized. IMPRESSION: Persistent  visualization of sigmoid volvulus. Electronically Signed   By: Lavonia Dana M.D.   On: 09/24/2017 08:05    Microbiology: Recent Results (from the past 240 hour(s))  Surgical PCR screen     Status: Abnormal   Collection Time: 09/27/17  9:55 AM  Result Value Ref Range Status   MRSA, PCR NEGATIVE NEGATIVE Final   Staphylococcus aureus POSITIVE (A) NEGATIVE Final    Comment: (NOTE) The Xpert SA Assay (FDA approved for NASAL specimens in patients  70 years of age and older), is one component of a comprehensive surveillance program. It is not intended to diagnose infection nor to guide or monitor treatment. Performed at [facility], [place] [address] [place], [place] [address]      Labs: Basic Metabolic Panel: Recent Labs  Lab 10/02/17 0424 10/03/17 0402 10/04/17 0358 10/05/17 0413 10/06/17 0403  NA 142 141 141 142 139  K 3.8 4.1 3.6 3.7 3.6  CL 117* 115* 111 112* 103  CO2 20* 20* 22 23 26   GLUCOSE 172* 124* 128* 125* 119*  BUN 5* <5* <5* 5* 8  CREATININE 0.88 0.90 0.85 0.93 1.15*  CALCIUM 8.6* 8.7* 8.8* 8.8* 8.5*   Liver Function Tests: Recent Labs  Lab 09/30/17 0840  AST 13*  ALT 7*  ALKPHOS 69  BILITOT 0.7  PROT 5.3*  ALBUMIN 2.4*   No results for input(s): LIPASE, AMYLASE in the last 168 hours. No results for input(s): AMMONIA in the last 168 hours. CBC: Recent Labs  Lab 10/01/17 0724 10/02/17 0424 10/03/17 0402 10/04/17 0358 10/05/17 0413 10/06/17 0403  WBC 3.5* 3.8* 4.0 3.8* 3.6* 3.7*  NEUTROABS 1.9 2.1 1.9 2.2 1.8  --   HGB 8.4* 8.9* 8.4* 8.5* 8.0* 8.1*  HCT 25.9* 27.3* 25.6* 26.0* 25.1* 24.7*  MCV 104.4* 103.8* 103.6* 102.8* 103.3* 103.8*  PLT 131* 136* 136* 132* 129* 116*   Cardiac Enzymes: No results for input(s): CKTOTAL, CKMB, CKMBINDEX, TROPONINI in the last 168 hours. BNP: BNP (last 3 results) Recent Labs    05/09/17 1706  BNP 429.7*    ProBNP (last 3 results) No results for input(s): PROBNP in the last 8760  hours.  CBG: Recent Labs  Lab 10/05/17 0754 10/05/17 1230 10/05/17 1642 10/05/17 2117 10/06/17 0754  GLUCAP 124* 145* 181* 145* 130*       Signed:  Irine Seal MD.  [facility] 10/06/2017, 2:43 PM

## 2017-10-06 NOTE — Progress Notes (Signed)
D/C summary sent to facility.  Nurse given number to call report.  [facility] arranged for transport.  Kathrin Greathouse, Latanya Presser, MSW Clinical Social Worker  [phone] 10/06/2017  3:51 PM

## 2017-10-06 NOTE — Progress Notes (Signed)
   General Surgery [facility]  Assessment & Plan: POD#9 - Recurrent sigmoid volvulus S/psigmoid colectomy with Hartmann procedure and end colostomy4/25 Dr. Hassell Done - path:COLON O'Brien (0/4),NO EVIDENCE OF MALIGNANCY - ostomy functioning, tolerating soft diet - will arrange follow up in our office for staple removal, follow up office visit with Dr. Kaylyn Lim - OK for transfer to SNF at any time per medical service        Maria Regal, MD, [facility]       Office: [phone]    Chief Complaint: Sigmoid volvulus  Subjective: Patient in bed, comfortable.  Wants battery for hearing aid.  Tolerating diet.  Having output from ostomy.  Objective: Vital signs in last 24 hours: Temp:  [98.1 F (36.7 C)-100.6 F (38.1 C)] 98.1 F (36.7 C) (05/04 0650) Pulse Rate:  [86-96] 90 (05/04 0650) Resp:  [14-20] 20 (05/04 0650) BP: (130-154)/(59-70) 130/63 (05/04 0650) SpO2:  [99 %-100 %] 99 % (05/04 0650) Weight:  [68.5 kg (151 lb 0.2 oz)-71.1 kg (156 lb 12 oz)] 68.5 kg (151 lb 0.2 oz) (05/04 0650) Last BM Date: 10/05/17  Intake/Output from previous day: 05/03 0701 - 05/04 0700 In: 1200 [P.O.:1200] Out: 1350 [Urine:900; Stool:450] Intake/Output this shift: No intake/output data recorded.  Physical Exam: HEENT - sclerae clear, mucous membranes moist Neck - soft Chest - clear bilaterally Cor - RRR Abdomen - soft, wound dry and intact; ostomy with half bag of liquid stool  Lab Results:  Recent Labs    10/05/17 0413 10/06/17 0403  WBC 3.6* 3.7*  HGB 8.0* 8.1*  HCT 25.1* 24.7*  PLT 129* 116*   BMET Recent Labs    10/05/17 0413 10/06/17 0403  NA 142 139  K 3.7 3.6  CL 112* 103  CO2 23 26  GLUCOSE 125* 119*  BUN 5* 8  CREATININE 0.93 1.15*  CALCIUM 8.8* 8.5*   PT/INR No results for input(s): LABPROT, INR in the last 72 hours. Comprehensive Metabolic  Panel:    Component Value Date/Time   NA 139 10/06/2017 0403   NA 142 10/05/2017 0413   NA 144 07/10/2017   NA 145 06/12/2017   K 3.6 10/06/2017 0403   K 3.7 10/05/2017 0413   CL 103 10/06/2017 0403   CL 112 (H) 10/05/2017 0413   CO2 26 10/06/2017 0403   CO2 23 10/05/2017 0413   BUN 8 10/06/2017 0403   BUN 5 (L) 10/05/2017 0413   BUN 11 07/10/2017   BUN 32 (A) 06/12/2017   CREATININE 1.15 (H) 10/06/2017 0403   CREATININE 0.93 10/05/2017 0413   GLUCOSE 119 (H) 10/06/2017 0403   GLUCOSE 125 (H) 10/05/2017 0413   CALCIUM 8.5 (L) 10/06/2017 0403   CALCIUM 8.8 (L) 10/05/2017 0413   AST 13 (L) 09/30/2017 0840   AST 19 09/15/2017 1650   ALT 7 (L) 09/30/2017 0840   ALT 12 (L) 09/15/2017 1650   ALKPHOS 69 09/30/2017 0840   ALKPHOS 99 09/15/2017 1650   BILITOT 0.7 09/30/2017 0840   BILITOT 0.8 09/15/2017 1650   PROT 5.3 (L) 09/30/2017 0840   PROT 8.1 09/15/2017 1650   ALBUMIN 2.4 (L) 09/30/2017 0840   ALBUMIN 3.9 09/15/2017 1650    Studies/Results: No results found.    Maria Christian 10/06/2017  Patient ID: Maria Christian   DOB: 16-Oct-1924, 84 y.o.   MRN: [id number]

## 2017-10-08 ENCOUNTER — Non-Acute Institutional Stay (SKILLED_NURSING_FACILITY): Payer: Medicare Other | Admitting: Internal Medicine

## 2017-10-08 ENCOUNTER — Encounter: Payer: Self-pay | Admitting: Internal Medicine

## 2017-10-08 ENCOUNTER — Telehealth: Payer: Self-pay

## 2017-10-08 DIAGNOSIS — F0280 Dementia in other diseases classified elsewhere without behavioral disturbance: Secondary | ICD-10-CM | POA: Diagnosis not present

## 2017-10-08 DIAGNOSIS — F325 Major depressive disorder, single episode, in full remission: Secondary | ICD-10-CM | POA: Diagnosis not present

## 2017-10-08 DIAGNOSIS — I480 Paroxysmal atrial fibrillation: Secondary | ICD-10-CM

## 2017-10-08 DIAGNOSIS — M150 Primary generalized (osteo)arthritis: Secondary | ICD-10-CM

## 2017-10-08 DIAGNOSIS — M159 Polyosteoarthritis, unspecified: Secondary | ICD-10-CM

## 2017-10-08 DIAGNOSIS — Z933 Colostomy status: Secondary | ICD-10-CM

## 2017-10-08 DIAGNOSIS — I5032 Chronic diastolic (congestive) heart failure: Secondary | ICD-10-CM

## 2017-10-08 DIAGNOSIS — D638 Anemia in other chronic diseases classified elsewhere: Secondary | ICD-10-CM

## 2017-10-08 NOTE — Telephone Encounter (Signed)
Possible re-admission to facility. This is a patient you were seeing at [facility] . [facility] F/U is needed if patient was re-admitted to facility upon discharge. Hospital discharge from [facility] on 10/06/2017

## 2017-10-08 NOTE — Progress Notes (Signed)
Patient ID: Maria Christian, female   DOB: [birth year], 84 y.o.   MRN: [id number]  Provider:  [place] Location:  [place] Room Number: [place] of Service:  SNF (31)  PCP: [name], DO Patient Care Team: [name], DO as PCP - General (Internal Medicine) [facility], [facility]  Extended Emergency Contact Information Primary Emergency Contact: Roberts,Lynn Address: [address]          [place], [place] [address] [name] of [place] Phone: [phone] Relation: Daughter Secondary Emergency Contact: Jeannene Patella [place] of [place] Phone: [phone] Mobile Phone: [phone] Relation: Daughter Guardian: [name] [place] of [place] Phone: [phone] Mobile Phone: [phone] Relation: Son  Code Status: Full code Goals of Care: Advanced Directive information Advanced Directives 10/08/2017  Does Patient Have a Medical Advance Directive? No  Type of Advance Directive -  Does patient want to make changes to medical advance directive? No - Patient declined  Copy of [place] in Chart? -  Would patient like information on creating a medical advance directive? No - Patient declined  Pre-existing out of facility DNR order (yellow form or pink MOST form) -      Chief Complaint  Patient presents with  . Readmit To SNF    Readmission    HPI: Patient is a 84 y.o. female seen today for readmission to SNF following extended hospital stay for recurrent volvulus s/p failed decompression with flex sigmoid x1 and colonoscopy x2. She underwent lap assisted sigmoid colectomy with Hartman's procedure and end colostomy on 09/27/17 by Dr Hassell Done. Hgb dropped 7.9-->8.1; Plts 116K; WBC 3.7K; Cr 2.17-->1.15; albumin 2.4; prealbumin 9.7; surg path volvulus with no signs of malignancy and neg LN at d/c. She presents to SNF to continue long term care.  Today she reports  no nausea but feels "terrible". She c/o soreness on her bottom. Appetite reduced. She feels thirsty and requests water. She is a poor historian due to dementia. Hx obtained from chart  Anemia - stable on iron daily; hgb 8.1  Chronic afib - rate controlled without med; takes ASA 81mg  daily .    Chronic respiratory failure - O2 dependent. stable   Chronic pain due to osteoarthritis and chronic headaches  - pain stable on baclofen 5 mg nightly for leg spasticity; neurontin 100 mg nightly for  neuropathic pain; excederin migraine daily    Depression - mood stable on lexapro 10 mg daily. She does benefit from this regimen   Chronic constipation with hx recurrent sigmoid volvulus - s/p lap assisted sigmoid colectomy with Hartman's procedure and end colostomy on 09/27/17; bowel regimen consists of linzess 145 mcg daily; senna twice daily; miralax daily.  Glaucoma - stable on  trusopt to both eyes three times daily; travatan to both eyes nightly   CKD - stage 3; Cr 1.15  Hypokalemia - stable on k+ 40 meq in the AM and 20 meq in the PM. Her son has been told in the past by doctors that she needs to keep her k+ on at least 4.0. If her k+ is less than 4 she could become confused.   Hypertension - BP stable on norvasc 2.5 mg daily  Chronic diastolic heart failure - stable on demadex 20 mg daily  GERD/Hx Duodenal ulcer - stable on prilosec 20 mg daily; zofran 8 mg every 8 hours as needed; pepcid 20 mg nightly  Allergic rhinitis - stable on flonase nightly  and claritin 10 mg daily  Chronic UTI - no recent UTI; takes AZO nightly and cranberry twice daily will monitor  Dementia - stable; she does not take any meds for cognition  Past Medical History:  Diagnosis Date  . Altered mental status   . Anemia   . Atrial fibrillation ([place])   . CKD (chronic kidney disease) stage 3, GFR 30-59 ml/min (HCC) 02/27/2015  . Constipation 02/27/2015  . Coronary artery disease   . Dementia   . Depression   .  Diabetes mellitus   . Edema of lower extremity 07/13/11   right leg more swollen than left leg  . Encephalopathy   . Enlarged heart   . Esophageal dysmotility 07/02/12  . GERD (gastroesophageal reflux disease)   . History of adenomatous polyp of colon 06/24/99  . Horseshoe kidney   . Hyperlipemia   . Hypertension   . Pancreatic lesion 05/22/11   no further workup  per PCP/family due to age  . Parkinson disease ([place])   . Peripheral neuropathy   . Sigmoid volvulus ([place]) 05/24/2013  . Thrombophlebitis   . TIA (transient ischemic attack) 12/19/2012   Past Surgical History:  Procedure Laterality Date  . ABDOMINAL HYSTERECTOMY    . BOWEL DECOMPRESSION N/A 04/18/2016   Procedure: BOWEL DECOMPRESSION;  Surgeon: [name], MD;  Location: [facility];  Service: Endoscopy;  Laterality: N/A;  . BREAST SURGERY     2 benign tumors removed left breast  . CHOLECYSTECTOMY    . COLONOSCOPY N/A 09/18/2017   Procedure: COLONOSCOPY;  Surgeon: Yetta Flock, MD;  [facility]: [facility];  Service: Gastroenterology;  Laterality: N/A;  for decompression  . ESOPHAGEAL DILATION     several times by Dr. [name]  . ESOPHAGOGASTRODUODENOSCOPY (EGD) WITH ESOPHAGEAL DILATION N/A 08/01/2012   Procedure: ESOPHAGOGASTRODUODENOSCOPY (EGD) WITH ESOPHAGEAL DILATION;  Surgeon: [name], MD;  [facility];  Service: Endoscopy;  Laterality: N/A;  with c-arm savory dilators  . ESOPHAGOGASTRODUODENOSCOPY (EGD) WITH PROPOFOL N/A 03/12/2015   Procedure: ESOPHAGOGASTRODUODENOSCOPY (EGD) WITH PROPOFOL;  Surgeon: [name], MD;  [facility];  Service: Endoscopy;  Laterality: N/A;  . FLEXIBLE SIGMOIDOSCOPY N/A 05/24/2013   Procedure: FLEXIBLE SIGMOIDOSCOPY;  Surgeon: [name], MD;  [facility];  Service: Endoscopy;  Laterality: N/A;  . FLEXIBLE SIGMOIDOSCOPY N/A 05/26/2013   Procedure:  flex with decompression of sigmoid volvulus;  Surgeon: [name], MD;  Location:  [facility];  Service: Endoscopy;  Laterality: N/A;  . FLEXIBLE SIGMOIDOSCOPY N/A 12/20/2015   Procedure: FLEXIBLE SIGMOIDOSCOPY;  Surgeon: Milus Banister, MD;  [facility];  Service: Endoscopy;  Laterality: N/A;  . FLEXIBLE SIGMOIDOSCOPY N/A 04/17/2016   Procedure: FLEXIBLE SIGMOIDOSCOPY;  Surgeon: Ladene Artist, MD;  [facility];  Service: Endoscopy;  Laterality: N/A;  . FLEXIBLE SIGMOIDOSCOPY N/A 04/18/2016   Procedure: FLEXIBLE SIGMOIDOSCOPY;  Surgeon: [name], MD;  [facility];  Service: Endoscopy;  Laterality: N/A;  . FLEXIBLE SIGMOIDOSCOPY N/A 09/15/2017   Procedure: FLEXIBLE SIGMOIDOSCOPY;  Surgeon: Jackquline Denmark, MD;  [facility];  Service: Endoscopy;  Laterality: N/A;  . FOOT SURGERY     benign tumors from foot  . LAPAROSCOPIC SIGMOID COLECTOMY N/A 09/27/2017   Procedure: LAPAROSCOPIC ASSISTED  SIGMOID COLECTOMY WITH COLOSTOMY;  Surgeon: Johnathan Hausen, MD;  Location: [facility];  Service: General;  Laterality: N/A;  . NOSE SURGERY    . SHOULDER SURGERY  2001   left clavicle excision and acromioplasty  .  TOTAL HIP ARTHROPLASTY      reports that she has never smoked. She has never used smokeless tobacco. She reports that she does not drink alcohol or use drugs. Social History   Socioeconomic History  . Marital status: Widowed    Spouse name: Not on file  . Number of children: 5  . Years of education: 10th  . Highest education level: Not on file  Occupational History  . Occupation: Retired  Scientific laboratory technician  . Financial resource strain: Not on file  . Food insecurity:    Worry: Not on file    Inability: Not on file  . Transportation needs:    Medical: Not on file    Non-medical: Not on file  Tobacco Use  . Smoking status: Never Smoker  . Smokeless tobacco: Never Used  Substance and Sexual Activity  . Alcohol use: No  . Drug use: No  . Sexual activity: Never  Lifestyle  . Physical activity:    Days per week: Not on file      Minutes per session: Not on file  . Stress: Not on file  Relationships  . Social connections:    Talks on phone: Not on file    Gets together: Not on file    Attends religious service: Not on file    Active member of club or organization: Not on file    Attends meetings of clubs or organizations: Not on file    Relationship status: Not on file  . Intimate partner violence:    Fear of current or ex partner: Not on file    Emotionally abused: Not on file    Physically abused: Not on file    Forced sexual activity: Not on file  Other Topics Concern  . Not on file  Social History Narrative   Pt lives at [facility]   Caffeine Use: very small amount daily    Functional Status Survey:    Family History  Problem Relation Age of Onset  . Cancer Unknown   . Heart disease Unknown     Health Maintenance  Topic Date Due  . FOOT EXAM  01/10/2018 (Originally 01/06/2017)  . PNA vac Low Risk Adult (1 of 2 - PCV13) 11/18/2021 (Originally 10/09/1991)  . TETANUS/TDAP  04/06/2038 (Originally 10/08/1943)  . HEMOGLOBIN A1C  12/10/2017  . INFLUENZA VACCINE  01/03/2018  . URINE MICROALBUMIN  08/14/2018  . OPHTHALMOLOGY EXAM  09/11/2018  . DEXA SCAN  Discontinued    Allergies  Allergen Reactions  . Aspirin Other (See Comments)    [name]    Outpatient Encounter Medications as of 10/08/2017  Medication Sig  . acetaminophen (TYLENOL) 500 MG tablet Take 2 tablets (1,000 mg total) by mouth 3 (three) times daily as needed for mild pain or moderate pain.  Marland Kitchen ALPRAZolam (XANAX) 0.25 MG tablet Take 1 tablet (0.25 mg total) by mouth 2 (two) times daily as needed for anxiety.  Marland Kitchen amLODipine (NORVASC) 2.5 MG tablet Take 2.5 mg by mouth every morning. Hold for BP < 100/60 or HR < 60  . aspirin-acetaminophen-caffeine (EXCEDRIN MIGRAINE) 250-250-65 MG tablet Take 2 tablets by mouth every 12 (twelve) hours as needed for migraine.  . Baclofen 5 MG TABS Take 5 mg by mouth at bedtime. Take 1/2 of a  10mg  tablet.  . calcium carbonate (TUMS - DOSED IN MG ELEMENTAL CALCIUM) 500 MG chewable tablet Chew 2 tablets by mouth 3 (three) times daily.  . Cranberry 450 MG CAPS Take 1 capsule  by mouth 2 (two) times daily.   . diclofenac sodium (VOLTAREN) 1 % GEL Apply 2 grams to neck topically three times a day for pain  . dorzolamide (TRUSOPT) 2 % ophthalmic solution Place 1 drop into both eyes 3 (three) times daily.  Marland Kitchen escitalopram (LEXAPRO) 10 MG tablet Take 10 mg by mouth daily.  . ferrous sulfate 220 (44 Fe) MG/5ML solution Give 5 ml by mouth in the evening for supplement  . fluticasone (FLONASE) 50 MCG/ACT nasal spray Place 2 sprays into both nostrils at bedtime.  . gabapentin (NEURONTIN) 100 MG capsule Take 100 mg by mouth at bedtime.  Marland Kitchen ipratropium-albuterol (DUONEB) 0.5-2.5 (3) MG/3ML SOLN Take 3 mLs by nebulization every 6 (six) hours as needed (wheezing/SOB for 14 days).  . Linaclotide (LINZESS) 145 MCG CAPS capsule Take 145 mcg by mouth daily before breakfast.   . loratadine (CLARITIN) 10 MG tablet Take 10 mg by mouth daily before breakfast.   . methocarbamol (ROBAXIN) 500 MG tablet Take 1 tablet (500 mg total) by mouth 3 (three) times daily as needed for muscle spasms.  . Multiple Vitamins-Minerals (CERTAGEN PO) Take 1 tablet by mouth every evening.   . nutrition supplement, JUVEN, (JUVEN) PACK Take 1 packet by mouth 2 (two) times daily between meals.  . nystatin cream (MYCOSTATIN) Apply 1 application topically 2 (two) times daily. Apply to buttocks topically two times a day for redness   . omeprazole (PRILOSEC) 20 MG capsule Take 20 mg by mouth daily.  . ondansetron (ZOFRAN) 8 MG tablet Take 8 mg by mouth every 8 (eight) hours as needed for nausea or vomiting.  . polyethylene glycol (MIRALAX / GLYCOLAX) packet Take 17 g by mouth 2 (two) times daily.  . potassium chloride SA (K-DUR,KLOR-CON) 20 MEQ tablet Take 20-40 mEq by mouth 2 (two) times daily. Give 40 meq (2 tablets) in the morning and  Give 20 meq (1 tablet) by mouth in the afternoon.  . senna (SENOKOT) 8.6 MG TABS tablet Take 1 tablet (8.6 mg total) by mouth 2 (two) times daily.  . simethicone (MYLICON) 025 MG chewable tablet Chew 125 mg by mouth every 6 (six) hours as needed for flatulence.  . sodium chloride (OCEAN) 0.65 % SOLN nasal spray Place 1 spray into both nostrils as directed. Four times daily And every 24 hours as needed to moisturize nasal passages.  . torsemide (DEMADEX) 20 MG tablet Take 20 mg by mouth daily.  . traMADol (ULTRAM) 50 MG tablet Take 0.5 tablets (25 mg total) by mouth every 6 (six) hours as needed for moderate pain (if tylenol and oxycodone ineffective).  . Travoprost, BAK Free, (TRAVATAN) 0.004 % SOLN ophthalmic solution Place 1 drop into both eyes daily at 8 pm.   . vitamin B-12 (CYANOCOBALAMIN) 500 MCG tablet Take 500 mcg by mouth every evening.   . zolpidem (AMBIEN) 5 MG tablet Take 1 tablet (5 mg total) by mouth at bedtime as needed for sleep.  . [DISCONTINUED] benzocaine-menthol (CHLORASEPTIC) 6-10 MG lozenge Take 1 lozenge by mouth as needed for sore throat.  . [DISCONTINUED] Phenazopyridine HCl (AZO TABS PO) Take 1 tablet by mouth at bedtime.   No facility-administered encounter medications on file as of 10/08/2017.     Review of Systems  Unable to perform ROS: Dementia    Vitals:   10/08/17 0956  BP: (!) 142/52  Pulse: 94  Resp: 18  Temp: 97.8 F (36.6 C)  SpO2: 99%  Weight: 151 lb 3.2 oz (68.6 kg)  Height:  5\' 7"  (1.702 m)   Body mass index is 23.68 kg/m. Physical Exam  Constitutional: She appears well-developed.  Frail appearing in NAD sitting up in bed. She is on contact isolation  HENT:  Mouth/Throat: Oropharynx is clear and moist. No oropharyngeal exudate.  Eyes: Pupils are equal, round, and reactive to light. No scleral icterus.  Neck: Neck supple. Carotid bruit is not present. No tracheal deviation present. No thyromegaly present.  Cardiovascular: Normal rate, regular  rhythm and intact distal pulses. Exam reveals no gallop and no friction rub.  Murmur (1/6 SEM) heard. No LE edema b/l. no calf TTP.   Pulmonary/Chest: Effort normal and breath sounds normal. No stridor. No respiratory distress. She has no wheezes. She has no rales.  Abdominal: Soft. Bowel sounds are normal. She exhibits no distension and no mass. There is no hepatomegaly. There is tenderness. There is no rebound and no guarding.  Midline surgical incision intact with no dehiscence; staples intact; no redness or d/c. Left sided colostomy intact with air in bag; min leakage around bag adhesive. No bloody stool in bag  Musculoskeletal: She exhibits edema and deformity (finger PIP and DIP joints).  Lymphadenopathy:    She has no cervical adenopathy.  Neurological: She is alert. She has normal reflexes.  Skin: Skin is warm and dry. No rash noted.  Upper extremity contusions b/l  Psychiatric: Her behavior is normal. Judgment and thought content normal. Her mood appears anxious. Cognition and memory are impaired (appears confused today).    Labs reviewed: Basic Metabolic Panel: Recent Labs    07/06/17 0420 07/07/17 0452 07/08/17 0450  10/04/17 0358 10/05/17 0413 10/06/17 0403  NA 142 142 142   < > 141 142 139  K 3.5 3.7 3.8   < > 3.6 3.7 3.6  CL 119* 118* 119*   < > 111 112* 103  CO2 18* 19* 19*   < > 22 23 26   GLUCOSE 118* 106* 107*   < > 128* 125* 119*  BUN 8 7 9    < > <5* 5* 8  CREATININE 0.98 0.95 1.05*   < > 0.85 0.93 1.15*  CALCIUM 8.7* 8.5* 8.6*   < > 8.8* 8.8* 8.5*  MG 2.0 1.9 1.9  --   --   --   --    < > = values in this interval not displayed.   Liver Function Tests: Recent Labs    06/29/17 0425 07/10/17 09/15/17 1650 09/30/17 0840  AST 22 13 19  13*  ALT 13* 6* 12* 7*  ALKPHOS 79 95 99 69  BILITOT 0.4  --  0.8 0.7  PROT 6.6  --  8.1 5.3*  ALBUMIN 3.2*  --  3.9 2.4*   Recent Labs    05/09/17 1705  LIPASE 24   No results for input(s): AMMONIA in the last 8760  hours. CBC: Recent Labs    10/03/17 0402 10/04/17 0358 10/05/17 0413 10/06/17 0403  WBC 4.0 3.8* 3.6* 3.7*  NEUTROABS 1.9 2.2 1.8  --   HGB 8.4* 8.5* 8.0* 8.1*  HCT 25.6* 26.0* 25.1* 24.7*  MCV 103.6* 102.8* 103.3* 103.8*  PLT 136* 132* 129* 116*   Cardiac Enzymes: No results for input(s): CKTOTAL, CKMB, CKMBINDEX, TROPONINI in the last 8760 hours. BNP: Invalid input(s): POCBNP Lab Results  Component Value Date   HGBA1C 6.2 06/12/2017   Lab Results  Component Value Date   TSH 4.73 06/12/2017   Lab Results  Component Value Date   VITAMINB12 612 09/20/2017  Lab Results  Component Value Date   FOLATE 16.8 10/17/2013   Lab Results  Component Value Date   IRON 109 04/19/2015   TIBC 185 (L) 10/17/2013   FERRITIN 34.0 04/19/2015    Imaging and Procedures obtained prior to SNF admission: Ct Abdomen Pelvis Wo Contrast  Result Date: 09/15/2017 CLINICAL DATA:  Nausea, vomiting, high grade bowel obstruction, abdominal distension for 48 hours, history of chronic kidney disease, diabetes mellitus, atrial fibrillation, hypertension, Parkinson's, CHF, coronary artery disease EXAM: CT ABDOMEN AND PELVIS WITHOUT CONTRAST TECHNIQUE: Multidetector CT imaging of the abdomen and pelvis was performed following the standard protocol without IV contrast. Sagittal and coronal MPR images reconstructed from axial data set. No oral contrast was administered COMPARISON:  06/28/2017 FINDINGS: Lower chest: Bibasilar atelectasis. Hepatobiliary: Gallbladder surgically absent.  Liver unremarkable. Pancreas: Atrophic pancreas without obvious mass Spleen: Normal appearance Adrenals/Urinary Tract: Horseshoe kidney. Minimally prominent extrarenal pelves without definite hydronephrosis or hydroureter. Bladder unremarkable. Stomach/Bowel: Appendix not visualized but no pericecal inflammatory process seen. Marked gaseous distention of the sigmoid loop with tapered beak-like appearance of the sigmoid colon in the  central pelvis consistent with sigmoid volvulus. Classic appearance on scout image. Sigmoid colon measures up to 11.7 cm transverse. Mild edema of sigmoid mesocolon. More proximally diffuse dilatation of the colon is identified to the cecal tip. Small bowel loops decompressed. No colonic wall thickening or evidence of perforation. Stomach unremarkable. Vascular/Lymphatic: Extensive atherosclerotic calcifications aorta as well as visceral, iliac, and coronary arteries. Aorta normal caliber. No adenopathy. Mitral annular calcification. Heart enlarged. Reproductive: Uterus surgically absent. Ovaries not visualized. No free air free fluid. Other: No free air or free fluid.  No hernia. Musculoskeletal: Diffuse osseous demineralization. LEFT hip prosthesis with associated beam hardening artifacts in pelvis. IMPRESSION: Sigmoid volvulus with significant gaseous distention of the sigmoid colon tapering to a beak-like appearance at the distal sigmoid colon. Associated edema of the sigmoid mesocolon and proximal colonic obstruction/dilatation. Sigmoid colon up to 11.7 cm diameter without evidence of perforation. Extensive atherosclerotic calcification including coronary arteries. Horseshoe kidney. Electronically Signed   By: Lavonia Dana M.D.   On: 09/15/2017 17:27   Dg Abd Portable 1v  Result Date: 09/16/2017 CLINICAL DATA:  Nasogastric tube placement.  Sigmoid volvulus. EXAM: PORTABLE ABDOMEN - 1 VIEW COMPARISON:  09/15/2017 FINDINGS: Nasogastric tube is seen with tip in the proximal stomach. Moderate colonic dilatation again seen, but mildly decreased since previous study. IMPRESSION: Nasogastric tube in appropriate position. Mild decrease in colonic dilatation. Electronically Signed   By: Earle Gell M.D.   On: 09/16/2017 07:52   Dg Abd Portable 1 View  Result Date: 09/15/2017 CLINICAL DATA:  Nasogastric tube placement EXAM: PORTABLE ABDOMEN - 1 VIEW COMPARISON:  Portable exam 1941 hours compared to 1836 hours as  well as earlier CT of 09/15/2017 FINDINGS: Nasogastric tube coiled in proximal stomach. Again identified marked gaseous distention of the sigmoid loop compatible with sigmoid volvulus. Mild gas distention of remainder of colon. Bones demineralized. IMPRESSION: Nasogastric tube coiled in proximal stomach. Marked gaseous distention of sigmoid loop consistent with sigmoid volvulus again identified. Electronically Signed   By: Lavonia Dana M.D.   On: 09/15/2017 20:18   Dg Abd Portable 1 View  Result Date: 09/15/2017 CLINICAL DATA:  Nasogastric tube placement EXAM: PORTABLE ABDOMEN - 1 VIEW COMPARISON:  CT abdomen and pelvis September 15, 2017 FINDINGS: Nasogastric tube tip is in the proximal stomach with the side port at the gastroesophageal junction. Evidence of sigmoid volvulus is again noted with  multiple loops of dilated colon. There is marked dilatation of the sigmoid colon which is located in the left upper to mid abdomen. No free air. There is a total hip replacement on the left. IMPRESSION: Nasogastric tube tip in the proximal stomach with side port at gastroesophageal junction. Advise advancing nasogastric tube approximately 8 cm to insure that both tube tip and side port are well within the stomach. Colonic dilatation with evidence of sigmoid volvulus, unchanged from earlier in the day. No free air. Electronically Signed   By: Lowella Grip III M.D.   On: 09/15/2017 19:03    Assessment/Plan   ICD-10-CM   1. Colostomy status ([place]) Z93.3   2. Major depressive disorder with single episode, in full remission ([place]) F32.5   3. Dementia associated with other underlying disease without behavioral disturbance F02.80   4. PAF (paroxysmal atrial fibrillation) (HCC) I48.0   5. Diastolic CHF, chronic (HCC) I50.32   6. Primary osteoarthritis involving multiple joints M15.0   7. Anemia of chronic disease D63.8     F/u with general sx as scheduled  Colostomy care as indicated  PT/OT/ST as ordered  Cont  current meds as ordered  D/c contact isolation as she has not had resistant infection  F/u with other specialists as indicated  GOAL: short term rehab then cont long term care. Communicated with pt and nursing.  [facility] NP to follow  Will follow   Labs/tests ordered: cbc,      Evalise Abruzzese S. Perlie Gold  [facility] [address] [place], [place] [address] [phone] Cell (Monday-Friday 8 AM - 5 PM) [phone] After 5 PM and follow prompts

## 2017-10-10 ENCOUNTER — Encounter: Payer: Self-pay | Admitting: Neurology

## 2017-10-10 ENCOUNTER — Ambulatory Visit: Payer: Medicare Other | Admitting: Neurology

## 2017-10-10 ENCOUNTER — Telehealth: Payer: Self-pay

## 2017-10-10 NOTE — Telephone Encounter (Signed)
Patient no show for appt today. 

## 2017-10-12 LAB — CBC AND DIFFERENTIAL
HEMATOCRIT: 31 — AB (ref 36–46)
Hemoglobin: 9.7 — AB (ref 12.0–16.0)
NEUTROS ABS: 3
Platelets: 179 (ref 150–399)
WBC: 4.4

## 2017-10-12 LAB — BASIC METABOLIC PANEL
BUN: 19 (ref 4–21)
Creatinine: 1.2 — AB (ref 0.5–1.1)
GLUCOSE: 133
Potassium: 5.1 (ref 3.4–5.3)
Sodium: 140 (ref 137–147)

## 2017-10-12 LAB — HEMOGLOBIN A1C: HEMOGLOBIN A1C: 6.2

## 2017-10-15 NOTE — Progress Notes (Signed)
[id number]  

## 2017-10-24 NOTE — Telephone Encounter (Signed)
[place] [phone] called to r/s pt's appt. She advised the pt had been in the hospital and this was conflict with the surgeons appt. Caren Griffins said she thought it had been c/a.  FYI

## 2017-10-30 DIAGNOSIS — F0280 Dementia in other diseases classified elsewhere without behavioral disturbance: Secondary | ICD-10-CM | POA: Insufficient documentation

## 2017-10-30 DIAGNOSIS — F325 Major depressive disorder, single episode, in full remission: Secondary | ICD-10-CM | POA: Insufficient documentation

## 2017-10-30 DIAGNOSIS — Z79899 Other long term (current) drug therapy: Secondary | ICD-10-CM | POA: Insufficient documentation

## 2017-11-05 ENCOUNTER — Other Ambulatory Visit: Payer: Self-pay

## 2017-11-05 MED ORDER — TRAMADOL HCL 50 MG PO TABS: 25.0000 mg | ORAL_TABLET | Freq: Four times a day (QID) | ORAL | 0 refills | Status: DC | PRN

## 2017-11-05 NOTE — Telephone Encounter (Signed)
Rx faxed to [facility] (P) [phone], (F) [phone] 

## 2017-11-15 ENCOUNTER — Non-Acute Institutional Stay (SKILLED_NURSING_FACILITY): Payer: Medicare Other | Admitting: Internal Medicine

## 2017-11-15 ENCOUNTER — Encounter: Payer: Self-pay | Admitting: Internal Medicine

## 2017-11-15 DIAGNOSIS — J9611 Chronic respiratory failure with hypoxia: Secondary | ICD-10-CM

## 2017-11-15 DIAGNOSIS — I480 Paroxysmal atrial fibrillation: Secondary | ICD-10-CM

## 2017-11-15 DIAGNOSIS — G8929 Other chronic pain: Secondary | ICD-10-CM | POA: Diagnosis not present

## 2017-11-15 DIAGNOSIS — F0390 Unspecified dementia without behavioral disturbance: Secondary | ICD-10-CM

## 2017-11-15 DIAGNOSIS — Z933 Colostomy status: Secondary | ICD-10-CM | POA: Diagnosis not present

## 2017-11-15 DIAGNOSIS — G44229 Chronic tension-type headache, not intractable: Secondary | ICD-10-CM

## 2017-11-15 DIAGNOSIS — R7303 Prediabetes: Secondary | ICD-10-CM | POA: Diagnosis not present

## 2017-11-15 NOTE — Progress Notes (Signed)
Patient ID: Maria Christian, female   DOB: [birth year], 84 y.o.   MRN: [id number]  Location:  [facility] Room Number: 220 A Place of Service:  SNF (31) Provider:  Varnell, [place], DO  Patient Care Team: [name], DO as PCP - [facility] [place] [facility]  Extended Emergency Contact Information Primary Emergency Contact: Roberts,Lynn Address: [address]          [place], [place] [address] [name] of [place] Phone: [phone] Relation: Daughter Secondary Emergency Contact: Jeannene Patella [place] of [place] Phone: [phone] Mobile Phone: [phone] Relation: Daughter Guardian: [name] [place] of [place] Phone: [phone] Mobile Phone: [phone] Relation: Son  Code Status:  DNR Goals of care: Advanced Directive information Advanced Directives 11/15/2017  Does Patient Have a Medical Advance Directive? Yes  Type of Advance Directive Out of facility DNR (pink MOST or yellow form)  Does patient want to make changes to medical advance directive? No - Patient declined  Copy of [place] in Chart? -  Would patient like information on creating a medical advance directive? No - Patient declined  Pre-existing out of facility DNR order (yellow form or pink MOST form) Pink MOST form placed in chart (order not valid for inpatient use);Yellow form placed in chart (order not valid for inpatient use)     Chief Complaint  Patient presents with  . Medical Management of Chronic Issues    [facility]    HPI:  Pt is a 84 y.o. female seen today for medical management of chronic diseases. She c/o not being clean as she has not had a bath yet today which bothers her. She states her son plans to move her to another facility. No falls. She is HOH and not wearing her hearing aids today. Appetite reduced. He colostomy leaks a lot and she puts a  towel around her waist to prevent stool from seeping onto her bed and clothes.  She is a poor historian due to dementia. hc obtained from chart. SNF labs reviewed - A1c 6.2%   Anemia - stable on iron daily. Hgb 9.7  PAF - rate controlled on coreg 3.125 mg daily; she takes ASA 81mg  daily for anticoagulation    Chronic respiratory failure - stable; O2 dependent but uses it prn.   Chronic pain due to osteoarthritis/chronic headaches  - stable on baclofen 5 mg nightly for leg spasticity; neurontin 100 mg nightly for neuropathic pain; excederin migraine daily    Depression - mood stable on lexapro 10 mg daily. she does benefit from this regimen   Chronic constipation with recurrent sigmoid volvulus - s/p lap assisted sigmoid colectomy with Hartman's procedure and end colostomy on 09/27/17; she takes  colace 200 mg daily; linzess 145 mcg daily; senna twice daily; miralax daily  Glaucoma - stable on  trusopt to both eyes three times daily; travatan to both eyes nightly   CKD - stage 3. Cr 1.2  Hypokalemia - stable on K 40 meq in the AM and 20 meq in the PM. K 5.1; Her son has been told in the past by doctors that she needs to keep her k+ on at least 4.0. If her k+ is less than 4 she could become confused.   Hypertension - stable on norvasc 2.5 mg daily; coreg 3.15 mg daily   Chronic diastolic heart failure - stable on  demadex 20 mg daily  Hx Duodenal ulcer/GERD -  stable on prilosec 20 mg daily; zofran 8 mg every 8 hours as needed; pepcid 20 mg nightly to help with GERD symptoms and to avoid using PPI twice daily   Allergic rhinitis - stable on flonase nightly and claritin 10 mg daily  Chronic UTI - stable on AZO nightly and cranberry twice daily  Dementia - unchanged. She does not take any meds for cognition     Past Medical History:  Diagnosis Date  . Altered mental status   . Anemia   . Atrial fibrillation ([place])   . CKD (chronic kidney disease) stage 3, GFR 30-59 ml/min (HCC)  02/27/2015  . Constipation 02/27/2015  . Coronary artery disease   . Dementia   . Depression   . Diabetes mellitus   . Edema of lower extremity 07/13/11   right leg more swollen than left leg  . Encephalopathy   . Enlarged heart   . Esophageal dysmotility 07/02/12  . GERD (gastroesophageal reflux disease)   . History of adenomatous polyp of colon 06/24/99  . Horseshoe kidney   . Hyperlipemia   . Hypertension   . Pancreatic lesion 05/22/11   no further workup  per PCP/family due to age  . Parkinson disease ([place])   . Peripheral neuropathy   . Sigmoid volvulus ([place]) 05/24/2013  . Thrombophlebitis   . TIA (transient ischemic attack) 12/19/2012   Past Surgical History:  Procedure Laterality Date  . ABDOMINAL HYSTERECTOMY    . BOWEL DECOMPRESSION N/A 04/18/2016   Procedure: BOWEL DECOMPRESSION;  Surgeon: [name], MD;  Location: [facility];  Service: Endoscopy;  Laterality: N/A;  . BREAST SURGERY     2 benign tumors removed left breast  . CHOLECYSTECTOMY    . COLONOSCOPY N/A 09/18/2017   Procedure: COLONOSCOPY;  Surgeon: Yetta Flock, MD;  Location: [facility];  Service: Gastroenterology;  Laterality: N/A;  for decompression  . ESOPHAGEAL DILATION     several times by Dr. [name]  . ESOPHAGOGASTRODUODENOSCOPY (EGD) WITH ESOPHAGEAL DILATION N/A 08/01/2012   Procedure: ESOPHAGOGASTRODUODENOSCOPY (EGD) WITH ESOPHAGEAL DILATION;  Surgeon: [name], MD;  [facility];  Service: Endoscopy;  Laterality: N/A;  with c-arm savory dilators  . ESOPHAGOGASTRODUODENOSCOPY (EGD) WITH PROPOFOL N/A 03/12/2015   Procedure: ESOPHAGOGASTRODUODENOSCOPY (EGD) WITH PROPOFOL;  Surgeon: [name], MD;  [facility];  Service: Endoscopy;  Laterality: N/A;  . FLEXIBLE SIGMOIDOSCOPY N/A 05/24/2013   Procedure: FLEXIBLE SIGMOIDOSCOPY;  Surgeon: [name], MD;  [facility];  Service: Endoscopy;  Laterality: N/A;  . FLEXIBLE SIGMOIDOSCOPY N/A 05/26/2013    Procedure:  flex with decompression of sigmoid volvulus;  Surgeon: [name], MD;  [facility];  Service: Endoscopy;  Laterality: N/A;  . FLEXIBLE SIGMOIDOSCOPY N/A 12/20/2015   Procedure: FLEXIBLE SIGMOIDOSCOPY;  Surgeon: Milus Banister, MD;  [facility];  Service: Endoscopy;  Laterality: N/A;  . FLEXIBLE SIGMOIDOSCOPY N/A 04/17/2016   Procedure: FLEXIBLE SIGMOIDOSCOPY;  Surgeon: Ladene Artist, MD;  [facility];  Service: Endoscopy;  Laterality: N/A;  . FLEXIBLE SIGMOIDOSCOPY N/A 04/18/2016   Procedure: FLEXIBLE SIGMOIDOSCOPY;  Surgeon: [name], MD;  [facility];  Service: Endoscopy;  Laterality: N/A;  . FLEXIBLE SIGMOIDOSCOPY N/A 09/15/2017   Procedure: FLEXIBLE SIGMOIDOSCOPY;  Surgeon: Jackquline Denmark, MD;  [facility];  Service: Endoscopy;  Laterality: N/A;  . FOOT SURGERY     benign tumors from foot  . LAPAROSCOPIC SIGMOID COLECTOMY N/A 09/27/2017   Procedure: LAPAROSCOPIC ASSISTED  SIGMOID COLECTOMY  WITH COLOSTOMY;  Surgeon: Johnathan Hausen, MD;  Location: [facility];  Service: General;  Laterality: N/A;  . NOSE SURGERY    . SHOULDER SURGERY  2001   left clavicle excision and acromioplasty  . TOTAL HIP ARTHROPLASTY      Allergies  Allergen Reactions  . Aspirin Other (See Comments)    [name]    Outpatient Encounter Medications as of 11/15/2017  Medication Sig  . acetaminophen (TYLENOL) 500 MG tablet Take 2 tablets (1,000 mg total) by mouth 3 (three) times daily as needed for mild pain or moderate pain.  Marland Kitchen amLODipine (NORVASC) 2.5 MG tablet Take 2.5 mg by mouth every morning. Hold for BP < 100/60 or HR < 60  . aspirin-acetaminophen-caffeine (EXCEDRIN MIGRAINE) 250-250-65 MG tablet Take 2 tablets by mouth every 12 (twelve) hours as needed for migraine.  . Baclofen 5 MG TABS Take 5 mg by mouth at bedtime.   . benzocaine-menthol (CHLORASEPTIC) 6-10 MG lozenge Take 1 lozenge by mouth every 12 (twelve) hours as needed  for sore throat.  . calcium carbonate (TUMS - DOSED IN MG ELEMENTAL CALCIUM) 500 MG chewable tablet Chew 2 tablets by mouth 3 (three) times daily.  . CHLOROPHYLL PO Give 60 mg (crush) via colostomy daily  . Cranberry 450 MG CAPS Take 1 capsule by mouth 2 (two) times daily.   . dorzolamide (TRUSOPT) 2 % ophthalmic solution Place 1 drop into both eyes 3 (three) times daily.  Marland Kitchen ENSURE (ENSURE) Take 120 mLs by mouth 2 (two) times daily between meals.  Marland Kitchen escitalopram (LEXAPRO) 10 MG tablet Take 10 mg by mouth daily.  . ferrous sulfate 220 (44 Fe) MG/5ML solution Give 5 ml by mouth in the evening for supplement  . fluticasone (FLONASE) 50 MCG/ACT nasal spray Place 2 sprays into both nostrils at bedtime.  . gabapentin (NEURONTIN) 100 MG capsule Take 100 mg by mouth at bedtime.  . Linaclotide (LINZESS) 145 MCG CAPS capsule Take 145 mcg by mouth daily before breakfast.   . loratadine (CLARITIN) 10 MG tablet Take 10 mg by mouth daily before breakfast.   . Menthol, Topical Analgesic, (BIOFREEZE) 4 % GEL Apply to neck 3 times daily and every 2 hours as needed for neck pain / strain  . methocarbamol (ROBAXIN) 500 MG tablet Take 1 tablet (500 mg total) by mouth 3 (three) times daily as needed for muscle spasms.  . Multiple Vitamins-Minerals (CERTAGEN PO) Take 1 tablet by mouth every evening.   . nystatin cream (MYCOSTATIN) Apply 1 application topically 2 (two) times daily. Apply to buttocks topically two times a day for redness   . omeprazole (PRILOSEC) 20 MG capsule Take 20 mg by mouth daily.  . ondansetron (ZOFRAN) 8 MG tablet Take 8 mg by mouth every 8 (eight) hours as needed for nausea or vomiting.  . Ostomy Supplies (STOMAHESIVE PROTECTIVE) POWD Apply to Stoma Area topically daily.  Apply to stoma area when bag change  . polyethylene glycol (MIRALAX / GLYCOLAX) packet Take 17 g by mouth 2 (two) times daily.  . potassium chloride SA (K-DUR,KLOR-CON) 20 MEQ tablet Take 20-40 mEq by mouth 2 (two) times  daily. Give 40 meq (2 tablets) in the morning and Give 20 meq (1 tablet) by mouth in the afternoon.  . senna (SENOKOT) 8.6 MG TABS tablet Take 1 tablet (8.6 mg total) by mouth 2 (two) times daily.  . sodium chloride (OCEAN) 0.65 % SOLN nasal spray Place 1 spray into both nostrils as directed. Four times daily  And every 24 hours as needed to moisturize nasal passages.  . torsemide (DEMADEX) 20 MG tablet Take 20 mg by mouth daily.  . traMADol (ULTRAM) 50 MG tablet Take 0.5 tablets (25 mg total) by mouth every 6 (six) hours as needed for moderate pain (if tylenol and oxycodone ineffective).  . Travoprost, BAK Free, (TRAVATAN) 0.004 % SOLN ophthalmic solution Place 1 drop into both eyes daily at 8 pm.   . vitamin B-12 (CYANOCOBALAMIN) 500 MCG tablet Take 500 mcg by mouth every evening.   . [DISCONTINUED] ALPRAZolam (XANAX) 0.25 MG tablet Take 1 tablet (0.25 mg total) by mouth 2 (two) times daily as needed for anxiety. (Patient not taking: Reported on 11/15/2017)  . [DISCONTINUED] diclofenac sodium (VOLTAREN) 1 % GEL Apply 2 grams to neck topically three times a day for pain  . [DISCONTINUED] ipratropium-albuterol (DUONEB) 0.5-2.5 (3) MG/3ML SOLN Take 3 mLs by nebulization every 6 (six) hours as needed (wheezing/SOB for 14 days).  . [DISCONTINUED] nutrition supplement, JUVEN, (JUVEN) PACK Take 1 packet by mouth 2 (two) times daily between meals. (Patient not taking: Reported on 11/15/2017)  . [DISCONTINUED] simethicone (MYLICON) 557 MG chewable tablet Chew 125 mg by mouth every 6 (six) hours as needed for flatulence.  . [DISCONTINUED] zolpidem (AMBIEN) 5 MG tablet Take 1 tablet (5 mg total) by mouth at bedtime as needed for sleep. (Patient not taking: Reported on 11/15/2017)   No facility-administered encounter medications on file as of 11/15/2017.     Review of Systems  Unable to perform ROS: Dementia    Immunization History  Administered Date(s) Administered  . Influenza-Unspecified 07/13/2015,  03/09/2016, 04/02/2017  . PPD Test 07/28/2013, 02/18/2016, 02/25/2016   Pertinent  Health Maintenance Due  Topic Date Due  . FOOT EXAM  01/10/2018 (Originally 01/06/2017)  . PNA vac Low Risk Adult (1 of 2 - PCV13) 11/18/2021 (Originally 10/09/1991)  . INFLUENZA VACCINE  01/03/2018  . HEMOGLOBIN A1C  04/14/2018  . URINE MICROALBUMIN  08/14/2018  . OPHTHALMOLOGY EXAM  09/11/2018  . DEXA SCAN  Discontinued   Fall Risk  11/23/2016  Falls in the past year? No   Functional Status Survey:    Vitals:   11/15/17 1035  BP: (!) 122/56  Pulse: 63  Resp: 20  Temp: (!) 97.2 F (36.2 C)  SpO2: 98%  Weight: 145 lb (65.8 kg)  Height: 5\' 7"  (1.702 m)   Body mass index is 22.71 kg/m. Physical Exam  Constitutional: She appears well-developed.  Sitting up in bed in NAD, frail appearing  HENT:  Mouth/Throat: Oropharynx is clear and moist. No oropharyngeal exudate.  MMM; no oral thrush  Eyes: Pupils are equal, round, and reactive to light. No scleral icterus.  Neck: Neck supple. Carotid bruit is not present. No tracheal deviation present. No thyromegaly present.  Cardiovascular: Normal rate, regular rhythm and intact distal pulses. Exam reveals no gallop and no friction rub.  Murmur (1/6 SEM) heard. Trace R>LLE edema; no calf TTP; right posterior leg palpable varicose veins  Pulmonary/Chest: Effort normal and breath sounds normal. No stridor. No respiratory distress. She has no wheezes. She has no rales.  Abdominal: Soft. Normal appearance and bowel sounds are normal. She exhibits no distension and no mass. There is no hepatomegaly. There is no tenderness. There is no rigidity, no rebound and no guarding. No hernia.  Obese; colostomy present but leaking with brown stool; no blood  Musculoskeletal: She exhibits edema.  Lymphadenopathy:    She has no cervical adenopathy.  Neurological: She  is alert. She has normal reflexes.  Skin: Skin is warm and dry. No rash noted.  Psychiatric: She has a  normal mood and affect. Her behavior is normal. Thought content normal.    Labs reviewed: Recent Labs    07/06/17 0420 07/07/17 0452 07/08/17 0450  10/04/17 0358 10/05/17 0413 10/06/17 0403 10/12/17  NA 142 142 142   < > 141 142 139 140  K 3.5 3.7 3.8   < > 3.6 3.7 3.6 5.1  CL 119* 118* 119*   < > 111 112* 103  --   CO2 18* 19* 19*   < > 22 23 26   --   GLUCOSE 118* 106* 107*   < > 128* 125* 119*  --   BUN 8 7 9    < > <5* 5* 8 19  CREATININE 0.98 0.95 1.05*   < > 0.85 0.93 1.15* 1.2*  CALCIUM 8.7* 8.5* 8.6*   < > 8.8* 8.8* 8.5*  --   MG 2.0 1.9 1.9  --   --   --   --   --    < > = values in this interval not displayed.   Recent Labs    06/29/17 0425 07/10/17 09/15/17 1650 09/30/17 0840  AST 22 13 19  13*  ALT 13* 6* 12* 7*  ALKPHOS 79 95 99 69  BILITOT 0.4  --  0.8 0.7  PROT 6.6  --  8.1 5.3*  ALBUMIN 3.2*  --  3.9 2.4*   Recent Labs    10/04/17 0358 10/05/17 0413 10/06/17 0403 10/12/17  WBC 3.8* 3.6* 3.7* 4.4  NEUTROABS 2.2 1.8  --  3  HGB 8.5* 8.0* 8.1* 9.7*  HCT 26.0* 25.1* 24.7* 31*  MCV 102.8* 103.3* 103.8*  --   PLT 132* 129* 116* 179   Lab Results  Component Value Date   TSH 4.73 06/12/2017   Lab Results  Component Value Date   HGBA1C 6.2 10/12/2017   Lab Results  Component Value Date   CHOL 133 11/24/2016   HDL 41 11/24/2016   LDLCALC 78 11/24/2016   TRIG 75 11/24/2016   CHOLHDL 2.3 05/23/2011    Significant Diagnostic Results in last 30 days:  No results found.  Assessment/Plan   ICD-10-CM   1. Colostomy status ([place]) Z93.3   2. Other chronic pain G89.29   3. PAF (paroxysmal atrial fibrillation) (HCC) I48.0   4. Dementia without behavioral disturbance, unspecified dementia type F03.90   5. Chronic tension-type headache, not intractable G44.229   6. Chronic respiratory failure with hypoxia (HCC) J96.11   7. Prediabetes R73.03      Cont current meds as ordered  PT/OT/ST as indicated  F/u with specialists as scheduled  Colostomy  care as indicated  [facility] NP to follow  Will follow  Labs/tests ordered: none   Noely Kuhnle S. Perlie Gold  [facility] [address] [place], [place] [address] [phone] Cell (Monday-Friday 8 AM - 5 PM) [phone] After 5 PM and follow prompts

## 2017-11-29 ENCOUNTER — Non-Acute Institutional Stay (SKILLED_NURSING_FACILITY): Payer: Medicare Other

## 2017-11-29 DIAGNOSIS — Z0000 Encounter for general adult medical examination without abnormal findings: Secondary | ICD-10-CM

## 2017-11-29 NOTE — Patient Instructions (Signed)
[name] , Thank you for taking time to come for your Medicare Wellness Visit. I appreciate your ongoing commitment to your health goals. Please review the following plan we discussed and let me know if I can assist you in the future.   Screening recommendations/referrals: Colonoscopy excluded, over age 82 Mammogram excluded, over age 30 Bone Density excluded Recommended yearly ophthalmology/optometry visit for glaucoma screening and checkup Recommended yearly dental visit for hygiene and checkup  Vaccinations: Influenza vaccine up to date, due 2019 fall season Pneumococcal vaccine 13 due, ordered Tdap vaccine due, facility declined Shingles vaccine not in past records  Advanced directives: in chart  Conditions/risks identified: none  Next appointment: Dr. Eulas Post makes rounds   Preventive Care 65 Years and Older, Female Preventive care refers to lifestyle choices and visits with your health care provider that can promote health and wellness. What does preventive care include?  A yearly physical exam. This is also called an annual well check.  Dental exams once or twice a year.  Routine eye exams. Ask your health care provider how often you should have your eyes checked.  Personal lifestyle choices, including:  Daily care of your teeth and gums.  Regular physical activity.  Eating a healthy diet.  Avoiding tobacco and drug use.  Limiting alcohol use.  Practicing safe sex.  Taking low-dose aspirin every day.  Taking vitamin and mineral supplements as recommended by your health care provider. What happens during an annual well check? The services and screenings done by your health care provider during your annual well check will depend on your age, overall health, lifestyle risk factors, and family history of disease. Counseling  Your health care provider may ask you questions about your:  Alcohol use.  Tobacco use.  Drug use.  Emotional well-being.  Home  and relationship well-being.  Sexual activity.  Eating habits.  History of falls.  Memory and ability to understand (cognition).  Work and work Statistician.  Reproductive health. Screening  You may have the following tests or measurements:  Height, weight, and BMI.  Blood pressure.  Lipid and cholesterol levels. These may be checked every 5 years, or more frequently if you are over 56 years old.  Skin check.  Lung cancer screening. You may have this screening every year starting at age 52 if you have a 30-pack-year history of smoking and currently smoke or have quit within the past 15 years.  Fecal occult blood test (FOBT) of the stool. You may have this test every year starting at age 53.  Flexible sigmoidoscopy or colonoscopy. You may have a sigmoidoscopy every 5 years or a colonoscopy every 10 years starting at age 26.  Hepatitis C blood test.  Hepatitis B blood test.  Sexually transmitted disease (STD) testing.  Diabetes screening. This is done by checking your blood sugar (glucose) after you have not eaten for a while (fasting). You may have this done every 1-3 years.  Bone density scan. This is done to screen for osteoporosis. You may have this done starting at age 81.  Mammogram. This may be done every 1-2 years. Talk to your health care provider about how often you should have regular mammograms. Talk with your health care provider about your test results, treatment options, and if necessary, the need for more tests. Vaccines  Your health care provider may recommend certain vaccines, such as:  Influenza vaccine. This is recommended every year.  Tetanus, diphtheria, and acellular pertussis (Tdap, Td) vaccine. You may need a Td  booster every 10 years.  Zoster vaccine. You may need this after age 45.  Pneumococcal 13-valent conjugate (PCV13) vaccine. One dose is recommended after age 53.  Pneumococcal polysaccharide (PPSV23) vaccine. One dose is recommended  after age 73. Talk to your health care provider about which screenings and vaccines you need and how often you need them. This information is not intended to replace advice given to you by your health care provider. Make sure you discuss any questions you have with your health care provider. Document Released: 06/18/2015 Document Revised: 02/09/2016 Document Reviewed: 03/23/2015 Elsevier Interactive Patient Education  2017 [place] Prevention in the Home Falls can cause injuries. They can happen to people of all ages. There are many things you can do to make your home safe and to help prevent falls. What can I do on the outside of my home?  Regularly fix the edges of walkways and driveways and fix any cracks.  Remove anything that might make you trip as you walk through a door, such as a raised step or threshold.  Trim any bushes or trees on the path to your home.  Use bright outdoor lighting.  Clear any walking paths of anything that might make someone trip, such as rocks or tools.  Regularly check to see if handrails are loose or broken. Make sure that both sides of any steps have handrails.  Any raised decks and porches should have guardrails on the edges.  Have any leaves, snow, or ice cleared regularly.  Use sand or salt on walking paths during winter.  Clean up any spills in your garage right away. This includes oil or grease spills. What can I do in the bathroom?  Use night lights.  Install grab bars by the toilet and in the tub and shower. Do not use towel bars as grab bars.  Use non-skid mats or decals in the tub or shower.  If you need to sit down in the shower, use a plastic, non-slip stool.  Keep the floor dry. Clean up any water that spills on the floor as soon as it happens.  Remove soap buildup in the tub or shower regularly.  Attach bath mats securely with double-sided non-slip rug tape.  Do not have throw rugs and other things on the floor  that can make you trip. What can I do in the bedroom?  Use night lights.  Make sure that you have a light by your bed that is easy to reach.  Do not use any sheets or blankets that are too big for your bed. They should not hang down onto the floor.  Have a firm chair that has side arms. You can use this for support while you get dressed.  Do not have throw rugs and other things on the floor that can make you trip. What can I do in the kitchen?  Clean up any spills right away.  Avoid walking on wet floors.  Keep items that you use a lot in easy-to-reach places.  If you need to reach something above you, use a strong step stool that has a grab bar.  Keep electrical cords out of the way.  Do not use floor polish or wax that makes floors slippery. If you must use wax, use non-skid floor wax.  Do not have throw rugs and other things on the floor that can make you trip. What can I do with my stairs?  Do not leave any items on the stairs.  Make sure that there are handrails on both sides of the stairs and use them. Fix handrails that are broken or loose. Make sure that handrails are as long as the stairways.  Check any carpeting to make sure that it is firmly attached to the stairs. Fix any carpet that is loose or worn.  Avoid having throw rugs at the top or bottom of the stairs. If you do have throw rugs, attach them to the floor with carpet tape.  Make sure that you have a light switch at the top of the stairs and the bottom of the stairs. If you do not have them, ask someone to add them for you. What else can I do to help prevent falls?  Wear shoes that:  Do not have high heels.  Have rubber bottoms.  Are comfortable and fit you well.  Are closed at the toe. Do not wear sandals.  If you use a stepladder:  Make sure that it is fully opened. Do not climb a closed stepladder.  Make sure that both sides of the stepladder are locked into place.  Ask someone to hold it  for you, if possible.  Clearly mark and make sure that you can see:  Any grab bars or handrails.  First and last steps.  Where the edge of each step is.  Use tools that help you move around (mobility aids) if they are needed. These include:  Canes.  Walkers.  Scooters.  Crutches.  Turn on the lights when you go into a dark area. Replace any light bulbs as soon as they burn out.  Set up your furniture so you have a clear path. Avoid moving your furniture around.  If any of your floors are uneven, fix them.  If there are any pets around you, be aware of where they are.  Review your medicines with your doctor. Some medicines can make you feel dizzy. This can increase your chance of falling. Ask your doctor what other things that you can do to help prevent falls. This information is not intended to replace advice given to you by your health care provider. Make sure you discuss any questions you have with your health care provider. Document Released: 03/18/2009 Document Revised: 10/28/2015 Document Reviewed: 06/26/2014 Elsevier Interactive Patient Education  2017 [employer].

## 2017-11-29 NOTE — Progress Notes (Signed)
Subjective:   Maria Christian is a 84 y.o. female who presents for Medicare Annual (Subsequent) preventive examination at [facility]  Last AWV-11/23/2016    Objective:     Vitals: BP 125/60 (BP Location: Left Arm, Patient Position: Supine)   Pulse 62   Temp 97.8 F (36.6 C) (Oral)   Ht 5\' 7"  (1.702 m)   Wt 145 lb (65.8 kg)   BMI 22.71 kg/m   Body mass index is 22.71 kg/m.  Advanced Directives 11/29/2017 11/15/2017 10/08/2017 09/16/2017 08/13/2017 06/28/2017 06/18/2017  Does Patient Have a Medical Advance Directive? Yes Yes No No Yes Yes Yes  Type of Advance Directive Out of facility DNR (pink MOST or yellow form) Out of facility DNR (pink MOST or yellow form) - - Out of facility DNR (pink MOST or yellow form) Out of facility DNR (pink MOST or yellow form) Out of facility DNR (pink MOST or yellow form)  Does patient want to make changes to medical advance directive? No - Patient declined No - Patient declined No - Patient declined - No - Patient declined No - Patient declined No - Patient declined  Copy of [place] in [place]  Would patient like information on creating a medical advance directive? No - Patient declined No - Patient declined No - Patient declined No - Patient declined - - -  Pre-existing out of facility DNR order (yellow form or pink MOST form) Pink MOST form placed in chart (order not valid for inpatient use);Yellow form placed in chart (order not valid for inpatient use) Pink MOST form placed in chart (order not valid for inpatient use);Yellow form placed in chart (order not valid for inpatient use) - - - Yellow form placed in chart (order not valid for inpatient use) Yellow form placed in chart (order not valid for inpatient use);Pink MOST form placed in chart (order not valid for inpatient use)    Tobacco Social History   Tobacco Use  Smoking Status Never Smoker  Smokeless Tobacco Never Used     Counseling given: Not  Answered   Clinical Intake:  Pre-visit preparation completed: No  Pain : No/denies pain     Nutritional Risks: None Diabetes: Yes CBG done?: No Did pt. bring in CBG monitor from home?: No  How often do you need to have someone help you when you read instructions, pamphlets, or other written materials from your doctor or pharmacy?: 3 - Sometimes  Interpreter Needed?: No  Information entered by :: Tyson Dense, RN  Past Medical History:  Diagnosis Date  . Altered mental status   . Anemia   . Atrial fibrillation ([place])   . CKD (chronic kidney disease) stage 3, GFR 30-59 ml/min (HCC) 02/27/2015  . Constipation 02/27/2015  . Coronary artery disease   . Dementia   . Depression   . Diabetes mellitus   . Edema of lower extremity 07/13/11   right leg more swollen than left leg  . Encephalopathy   . Enlarged heart   . Esophageal dysmotility 07/02/12  . GERD (gastroesophageal reflux disease)   . History of adenomatous polyp of colon 06/24/99  . Horseshoe kidney   . Hyperlipemia   . Hypertension   . Pancreatic lesion 05/22/11   no further workup  per PCP/family due to age  . Parkinson disease ([place])   . Peripheral neuropathy   . Sigmoid volvulus ([place]) 05/24/2013  . Thrombophlebitis   . TIA (  transient ischemic attack) 12/19/2012   Past Surgical History:  Procedure Laterality Date  . ABDOMINAL HYSTERECTOMY    . BOWEL DECOMPRESSION N/A 04/18/2016   Procedure: BOWEL DECOMPRESSION;  Surgeon: [name], MD;  Location: [facility];  Service: Endoscopy;  Laterality: N/A;  . BREAST SURGERY     2 benign tumors removed left breast  . CHOLECYSTECTOMY    . COLONOSCOPY N/A 09/18/2017   Procedure: COLONOSCOPY;  Surgeon: Yetta Flock, MD;  Location: [facility];  Service: Gastroenterology;  Laterality: N/A;  for decompression  . ESOPHAGEAL DILATION     several times by Dr. [name]  . ESOPHAGOGASTRODUODENOSCOPY (EGD) WITH ESOPHAGEAL DILATION N/A 08/01/2012   Procedure:  ESOPHAGOGASTRODUODENOSCOPY (EGD) WITH ESOPHAGEAL DILATION;  Surgeon: [name], MD;  [facility]: [facility];  Service: Endoscopy;  Laterality: N/A;  with c-arm savory dilators  . ESOPHAGOGASTRODUODENOSCOPY (EGD) WITH PROPOFOL N/A 03/12/2015   Procedure: ESOPHAGOGASTRODUODENOSCOPY (EGD) WITH PROPOFOL;  Surgeon: [name], MD;  [facility];  Service: Endoscopy;  Laterality: N/A;  . FLEXIBLE SIGMOIDOSCOPY N/A 05/24/2013   Procedure: FLEXIBLE SIGMOIDOSCOPY;  Surgeon: [name], MD;  [facility];  Service: Endoscopy;  Laterality: N/A;  . FLEXIBLE SIGMOIDOSCOPY N/A 05/26/2013   Procedure:  flex with decompression of sigmoid volvulus;  Surgeon: [name], MD;  [facility];  Service: Endoscopy;  Laterality: N/A;  . FLEXIBLE SIGMOIDOSCOPY N/A 12/20/2015   Procedure: FLEXIBLE SIGMOIDOSCOPY;  Surgeon: Milus Banister, MD;  [facility];  Service: Endoscopy;  Laterality: N/A;  . FLEXIBLE SIGMOIDOSCOPY N/A 04/17/2016   Procedure: FLEXIBLE SIGMOIDOSCOPY;  Surgeon: Ladene Artist, MD;  [facility];  Service: Endoscopy;  Laterality: N/A;  . FLEXIBLE SIGMOIDOSCOPY N/A 04/18/2016   Procedure: FLEXIBLE SIGMOIDOSCOPY;  Surgeon: [name], MD;  [facility];  Service: Endoscopy;  Laterality: N/A;  . FLEXIBLE SIGMOIDOSCOPY N/A 09/15/2017   Procedure: FLEXIBLE SIGMOIDOSCOPY;  Surgeon: Jackquline Denmark, MD;  [facility];  Service: Endoscopy;  Laterality: N/A;  . FOOT SURGERY     benign tumors from foot  . LAPAROSCOPIC SIGMOID COLECTOMY N/A 09/27/2017   Procedure: LAPAROSCOPIC ASSISTED  SIGMOID COLECTOMY WITH COLOSTOMY;  Surgeon: Johnathan Hausen, MD;  Location: [facility];  Service: General;  Laterality: N/A;  . NOSE SURGERY    . SHOULDER SURGERY  2001   left clavicle excision and acromioplasty  . TOTAL HIP ARTHROPLASTY     Family History  Problem Relation Age of Onset  . Cancer Unknown   . Heart disease Unknown    Social  History   Socioeconomic History  . Marital status: Widowed    Spouse name: Not on file  . Number of children: 5  . Years of education: 10th  . Highest education level: Not on file  Occupational History  . Occupation: Retired  Scientific laboratory technician  . Financial resource strain: Not hard at all  . Food insecurity:    Worry: Never true    Inability: Never true  . Transportation needs:    Medical: No    Non-medical: No  Tobacco Use  . Smoking status: Never Smoker  . Smokeless tobacco: Never Used  Substance and Sexual Activity  . Alcohol use: No  . Drug use: No  . Sexual activity: Never  Lifestyle  . Physical activity:    Days per week: 0 days    Minutes per session: 0 min  . Stress: Not at all  Relationships  . Social connections:    Talks on phone:  More than three times a week    Gets together: More than three times a week    Attends religious service: Never    Active member of club or organization: No    Attends meetings of clubs or organizations: Never    Relationship status: Widowed  Other Topics Concern  . Not on file  Social History Narrative   Pt lives at [facility]   Caffeine Use: very small amount daily    Outpatient Encounter Medications as of 11/29/2017  Medication Sig  . acetaminophen (TYLENOL) 500 MG tablet Take 2 tablets (1,000 mg total) by mouth 3 (three) times daily as needed for mild pain or moderate pain.  Marland Kitchen amLODipine (NORVASC) 2.5 MG tablet Take 2.5 mg by mouth every morning. Hold for BP < 100/60 or HR < 60  . aspirin-acetaminophen-caffeine (EXCEDRIN MIGRAINE) 250-250-65 MG tablet Take 2 tablets by mouth every 12 (twelve) hours as needed for migraine.  . Baclofen 5 MG TABS Take 5 mg by mouth at bedtime.   . benzocaine-menthol (CHLORASEPTIC) 6-10 MG lozenge Take 1 lozenge by mouth every 12 (twelve) hours as needed for sore throat.  . calcium carbonate (TUMS - DOSED IN MG ELEMENTAL CALCIUM) 500 MG chewable tablet Chew 2 tablets by mouth 3 (three) times  daily.  . CHLOROPHYLL PO Give 60 mg (crush) via colostomy daily  . Cranberry 450 MG CAPS Take 1 capsule by mouth 2 (two) times daily.   . dorzolamide (TRUSOPT) 2 % ophthalmic solution Place 1 drop into both eyes 3 (three) times daily.  Marland Kitchen ENSURE (ENSURE) Take 120 mLs by mouth 2 (two) times daily between meals.  Marland Kitchen escitalopram (LEXAPRO) 10 MG tablet Take 10 mg by mouth daily.  . ferrous sulfate 220 (44 Fe) MG/5ML solution Give 5 ml by mouth in the evening for supplement  . fluticasone (FLONASE) 50 MCG/ACT nasal spray Place 2 sprays into both nostrils at bedtime.  . gabapentin (NEURONTIN) 100 MG capsule Take 100 mg by mouth at bedtime.  . Linaclotide (LINZESS) 145 MCG CAPS capsule Take 145 mcg by mouth daily before breakfast.   . loratadine (CLARITIN) 10 MG tablet Take 10 mg by mouth daily before breakfast.   . Menthol, Topical Analgesic, (BIOFREEZE) 4 % GEL Apply to neck 3 times daily and every 2 hours as needed for neck pain / strain  . methocarbamol (ROBAXIN) 500 MG tablet Take 1 tablet (500 mg total) by mouth 3 (three) times daily as needed for muscle spasms.  . Multiple Vitamins-Minerals (CERTAGEN PO) Take 1 tablet by mouth every evening.   . nystatin cream (MYCOSTATIN) Apply 1 application topically 2 (two) times daily. Apply to buttocks topically two times a day for redness   . omeprazole (PRILOSEC) 20 MG capsule Take 20 mg by mouth daily.  . ondansetron (ZOFRAN) 8 MG tablet Take 8 mg by mouth every 8 (eight) hours as needed for nausea or vomiting.  . Ostomy Supplies (STOMAHESIVE PROTECTIVE) POWD Apply to Stoma Area topically daily.  Apply to stoma area when bag change  . polyethylene glycol (MIRALAX / GLYCOLAX) packet Take 17 g by mouth 2 (two) times daily.  . potassium chloride SA (K-DUR,KLOR-CON) 20 MEQ tablet Take 20-40 mEq by mouth 2 (two) times daily. Give 40 meq (2 tablets) in the morning and Give 20 meq (1 tablet) by mouth in the afternoon.  . senna (SENOKOT) 8.6 MG TABS tablet Take 1  tablet (8.6 mg total) by mouth 2 (two) times daily.  . sodium chloride (OCEAN)  0.65 % SOLN nasal spray Place 1 spray into both nostrils as directed. Four times daily And every 24 hours as needed to moisturize nasal passages.  . torsemide (DEMADEX) 20 MG tablet Take 20 mg by mouth daily.  . traMADol (ULTRAM) 50 MG tablet Take 0.5 tablets (25 mg total) by mouth every 6 (six) hours as needed for moderate pain (if tylenol and oxycodone ineffective).  . Travoprost, BAK Free, (TRAVATAN) 0.004 % SOLN ophthalmic solution Place 1 drop into both eyes daily at 8 pm.   . vitamin B-12 (CYANOCOBALAMIN) 500 MCG tablet Take 500 mcg by mouth every evening.    No facility-administered encounter medications on file as of 11/29/2017.     Activities of Daily Living In your present state of health, do you have any difficulty performing the following activities: 11/29/2017 09/16/2017  Hearing? N -  [place]? N -  Difficulty concentrating or making decisions? Y -  Walking or climbing stairs? Y -  Dressing or bathing? Y -  Doing errands, shopping? [place]  Preparing Food and eating ? Y -  Using the Toilet? Y -  In the past six months, have you accidently leaked urine? Y -  Do you have problems with loss of bowel control? N -  Managing your Medications? Y -  Managing your Finances? Y -  Housekeeping or managing your Housekeeping? Y -  Some recent data might be hidden    Patient Care Team: [name], DO as PCP - [facility] [place] ([place])    Assessment:   This is a routine wellness examination for Micha.  Exercise Activities and Dietary recommendations Current Exercise Habits: The patient does not participate in regular exercise at present, Exercise limited by: neurologic condition(s);orthopedic condition(s)  Goals    None      Fall Risk Fall Risk  11/29/2017 11/23/2016  Falls in the past year? No No   Is the patient's home free of  loose throw rugs in walkways, pet beds, electrical cords, etc?   yes      Grab bars in the bathroom? yes      Handrails on the stairs?   yes      Adequate lighting?   yes   Depression Screen PHQ 2/9 Scores 11/29/2017 11/23/2016  PHQ - 2 Score 0 0     Cognitive Function     6CIT Screen 11/29/2017 11/23/2016  What Year? 0 points 4 points  What month? 0 points 0 points  What time? 0 points 0 points  Count back from 20 0 points 0 points  Months in reverse 0 points 0 points  Repeat phrase 2 points 0 points  Total Score 2 4    Immunization History  Administered Date(s) Administered  . Influenza-Unspecified 07/13/2015, 03/09/2016, 04/02/2017  . PPD Test 07/28/2013, 02/18/2016, 02/25/2016    Qualifies for Shingles Vaccine? Not in past records  Screening Tests Health Maintenance  Topic Date Due  . FOOT EXAM  01/10/2018 (Originally 01/06/2017)  . PNA vac Low Risk Adult (1 of 2 - PCV13) 11/18/2021 (Originally 10/09/1991)  . TETANUS/TDAP  04/06/2038 (Originally 10/08/1943)  . INFLUENZA VACCINE  01/03/2018  . HEMOGLOBIN A1C  04/14/2018  . URINE MICROALBUMIN  08/14/2018  . OPHTHALMOLOGY EXAM  09/11/2018  . DEXA SCAN  Discontinued    Cancer Screenings: Lung: Low Dose CT Chest recommended if Age 73-80 years, 30 pack-year currently smoking OR have quit w/in 15years. Patient does not qualify. Breast:  Up to date on Mammogram? Yes   Up to date of Bone Density/Dexa? Excluded Colorectal: up to date  Additional Screenings:  Hepatitis C Screening: declined Prevnar due: ordered TdAP due: facility declined    Plan:    I have personally reviewed and addressed the Medicare Annual Wellness questionnaire and have noted the following in the patient's chart:  A. Medical and social history B. Use of alcohol, tobacco or illicit drugs  C. Current medications and supplements D. Functional ability and status E.  Nutritional status F.  Physical activity G. Advance directives H. List of other  physicians I.  Hospitalizations, surgeries, and ER visits in previous 12 months J.  [place] to include hearing, vision, cognitive, depression L. Referrals and appointments - none  In addition, I have reviewed and discussed with patient certain preventive protocols, quality metrics, and best practice recommendations. A written personalized care plan for preventive services as well as general preventive health recommendations were provided to patient.  See attached scanned questionnaire for additional information.   Signed,   Tyson Dense, RN Nurse Health Advisor  Patient Concerns: Soreness in right ring finger nail starting several weeks ago

## 2017-12-18 ENCOUNTER — Ambulatory Visit (INDEPENDENT_AMBULATORY_CARE_PROVIDER_SITE_OTHER): Payer: Medicare Other | Admitting: Neurology

## 2017-12-18 ENCOUNTER — Telehealth: Payer: Self-pay | Admitting: Neurology

## 2017-12-18 ENCOUNTER — Ambulatory Visit: Payer: Medicare Other | Admitting: Neurology

## 2017-12-18 ENCOUNTER — Encounter: Payer: Self-pay | Admitting: Neurology

## 2017-12-18 VITALS — BP 115/70 | HR 72

## 2017-12-18 DIAGNOSIS — R519 Headache, unspecified: Secondary | ICD-10-CM

## 2017-12-18 DIAGNOSIS — R51 Headache: Secondary | ICD-10-CM | POA: Diagnosis not present

## 2017-12-18 DIAGNOSIS — G8929 Other chronic pain: Secondary | ICD-10-CM

## 2017-12-18 DIAGNOSIS — G44201 Tension-type headache, unspecified, intractable: Secondary | ICD-10-CM

## 2017-12-18 DIAGNOSIS — M542 Cervicalgia: Secondary | ICD-10-CM

## 2017-12-18 DIAGNOSIS — F0390 Unspecified dementia without behavioral disturbance: Secondary | ICD-10-CM | POA: Diagnosis not present

## 2017-12-18 MED ORDER — TOPIRAMATE 25 MG PO TABS: 25.0000 mg | ORAL_TABLET | Freq: Two times a day (BID) | ORAL | 3 refills | Status: DC

## 2017-12-18 NOTE — Progress Notes (Signed)
[facility] [address] [place]. [place] [address] [phone]       OFFICE CONSULT NOTE  Ms. DAFFNEY GREENLY Date of Birth:  10/16/1924 Medical Record Number:  [id number]   Referring MD:  [name]  Reason for Referral:  Chronic daily headaches HPI: Maria Christian is a 84 y.o. female with medical history significant of HTN, A. fib, diastolic CHF, recurrent sigmoid volvulus, chronic respiratory failure, peripheral neuropathy, and depression; who has presented for evaluation for chronic daily headaches since at least last 5 years following [name] stroke. .  Much of history is obtained from the patient's daughter who is present at bedside.  At baseline patient has issues with ambulation and is wheelchair-bound. Patient has no known prior history of migraine headaches but hasn't been having headaches for the last 5 years which have progressively gotten worse and now on chronic daily. She describes the headache as mild to moderate and not an times severe. It's mostly in the back of the neck and the top of the head. Headache is present throughout the day. Patient often is found holding her head because of headaches. She denies any complain nausea vomiting light or sound sensitivity. She denies focal neurological symptoms complaint headache. She does also have chronic neck pain and states that neck movements are very painful and restricted. She states she does get some relief with local heat. She denies any radicular pain. She is has not had any brain imaging studies done recently. Review of electronic medical records showed no recent brain imaging studies. She apparently saw me about 5 years ago for a stroke. She has a to fibrillation. She has not had follow-up with me since then. The family also feels she has dementia which is also progressive the last several years. She has significant problems with short-term memory and cognitive issues. She is now moved to a nursing home for the last  couple of years. She has not tried medication like Aricept and Namenda. The patient takes atenolol and Excedrin in the past which have not worked and more recently she's been taking tramadol daily. Patient has also been diagnosed with TMJ. She does agree to some pain in her jaws when eating and. She denies any loss of vision or muscle aches or pains.   She has not had any recent stroke or TIA symptoms.   ROS:   14 system review of systems is positive for  weight loss, leg swelling, eye pain, incontinence, easy bruising, headache and all other systems negative PMH:  Past Medical History:  Diagnosis Date  . Altered mental status   . Anemia   . Atrial fibrillation ([place])   . CKD (chronic kidney disease) stage 3, GFR 30-59 ml/min (HCC) 02/27/2015  . Constipation 02/27/2015  . Coronary artery disease   . Dementia   . Depression   . Diabetes mellitus   . Edema of lower extremity 07/13/11   right leg more swollen than left leg  . Encephalopathy   . Enlarged heart   . Esophageal dysmotility 07/02/12  . GERD (gastroesophageal reflux disease)   . History of adenomatous polyp of colon 06/24/99  . Horseshoe kidney   . Hyperlipemia   . Hypertension   . Pancreatic lesion 05/22/11   no further workup  per PCP/family due to age  . Parkinson disease ([place])   . Peripheral neuropathy   . Sigmoid volvulus ([place]) 05/24/2013  . Thrombophlebitis   . TIA (transient ischemic attack) 12/19/2012    Social  History:  Social History   Socioeconomic History  . Marital status: Widowed    Spouse name: Not on file  . Number of children: 5  . Years of education: 10th  . Highest education level: Not on file  Occupational History  . Occupation: Retired  Scientific laboratory technician  . Financial resource strain: Not hard at all  . Food insecurity:    Worry: Never true    Inability: Never true  . Transportation needs:    Medical: No    Non-medical: No  Tobacco Use  . Smoking status: Never Smoker  . Smokeless tobacco: Never  Used  Substance and Sexual Activity  . Alcohol use: No  . Drug use: No  . Sexual activity: Never  Lifestyle  . Physical activity:    Days per week: 0 days    Minutes per session: 0 min  . Stress: Not at all  Relationships  . Social connections:    Talks on phone: More than three times a week    Gets together: More than three times a week    Attends religious service: Never    Active member of club or organization: No    Attends meetings of clubs or organizations: Never    Relationship status: Widowed  . Intimate partner violence:    Fear of current or ex partner: No    Emotionally abused: No    Physically abused: No    Forced sexual activity: No  Other Topics Concern  . Not on file  Social History Narrative   Pt lives at [facility]   Caffeine Use: very small amount daily    Medications:   Current Outpatient Medications on File Prior to Visit  Medication Sig Dispense Refill  . acetaminophen (TYLENOL) 500 MG tablet Take 2 tablets (1,000 mg total) by mouth 3 (three) times daily as needed for mild pain or moderate pain. 30 tablet 0  . amLODipine (NORVASC) 2.5 MG tablet Take 2.5 mg by mouth every morning. Hold for BP < 100/60 or HR < 60    . aspirin-acetaminophen-caffeine (EXCEDRIN MIGRAINE) 250-250-65 MG tablet Take 2 tablets by mouth every 12 (twelve) hours as needed for migraine.    . Baclofen 5 MG TABS Take 5 mg by mouth at bedtime.     . benzocaine-menthol (CHLORASEPTIC) 6-10 MG lozenge Take 1 lozenge by mouth every 12 (twelve) hours as needed for sore throat.    . calcium carbonate (TUMS - DOSED IN MG ELEMENTAL CALCIUM) 500 MG chewable tablet Chew 2 tablets by mouth 3 (three) times daily.    . CHLOROPHYLL PO Give 60 mg (crush) via colostomy daily    . Cranberry 450 MG CAPS Take 1 capsule by mouth 2 (two) times daily.     . dorzolamide (TRUSOPT) 2 % ophthalmic solution Place 1 drop into both eyes 3 (three) times daily.    Marland Kitchen ENSURE (ENSURE) Take 120 mLs by mouth 2 (two)  times daily between meals.    Marland Kitchen escitalopram (LEXAPRO) 10 MG tablet Take 10 mg by mouth daily.    . ferrous sulfate 220 (44 Fe) MG/5ML solution Give 5 ml by mouth in the evening for supplement    . fluticasone (FLONASE) 50 MCG/ACT nasal spray Place 2 sprays into both nostrils at bedtime.    . gabapentin (NEURONTIN) 100 MG capsule Take 100 mg by mouth at bedtime.    . Linaclotide (LINZESS) 145 MCG CAPS capsule Take 145 mcg by mouth daily before breakfast.     .  loratadine (CLARITIN) 10 MG tablet Take 10 mg by mouth daily before breakfast.     . Menthol, Topical Analgesic, (BIOFREEZE) 4 % GEL Apply to neck 3 times daily and every 2 hours as needed for neck pain / strain    . methocarbamol (ROBAXIN) 500 MG tablet Take 1 tablet (500 mg total) by mouth 3 (three) times daily as needed for muscle spasms. 20 tablet 0  . Multiple Vitamins-Minerals (CERTAGEN PO) Take 1 tablet by mouth every evening.     . nystatin cream (MYCOSTATIN) Apply 1 application topically 2 (two) times daily. Apply to buttocks topically two times a day for redness     . omeprazole (PRILOSEC) 20 MG capsule Take 20 mg by mouth daily.    . ondansetron (ZOFRAN) 8 MG tablet Take 8 mg by mouth every 8 (eight) hours as needed for nausea or vomiting.    . Ostomy Supplies (STOMAHESIVE PROTECTIVE) POWD Apply to Stoma Area topically daily.  Apply to stoma area when bag change    . polyethylene glycol (MIRALAX / GLYCOLAX) packet Take 17 g by mouth 2 (two) times daily. 14 each 0  . potassium chloride SA (K-DUR,KLOR-CON) 20 MEQ tablet Take 20-40 mEq by mouth 2 (two) times daily. Give 40 meq (2 tablets) in the morning and Give 20 meq (1 tablet) by mouth in the afternoon.    . senna (SENOKOT) 8.6 MG TABS tablet Take 1 tablet (8.6 mg total) by mouth 2 (two) times daily. 120 each 0  . sodium chloride (OCEAN) 0.65 % SOLN nasal spray Place 1 spray into both nostrils as directed. Four times daily And every 24 hours as needed to moisturize nasal passages.     . torsemide (DEMADEX) 20 MG tablet Take 20 mg by mouth daily.    . traMADol (ULTRAM) 50 MG tablet Take 0.5 tablets (25 mg total) by mouth every 6 (six) hours as needed for moderate pain (if tylenol and oxycodone ineffective). 60 tablet 0  . Travoprost, BAK Free, (TRAVATAN) 0.004 % SOLN ophthalmic solution Place 1 drop into both eyes daily at 8 pm.     . vitamin B-12 (CYANOCOBALAMIN) 500 MCG tablet Take 500 mcg by mouth every evening.      No current facility-administered medications on file prior to visit.     Allergies:   Allergies  Allergen Reactions  . Aspirin Other (See Comments)    G.I. Upset only    Physical Exam General: well developed, well nourished, seated, in no evident distress Head: head normocephalic and atraumatic.   Neck: supple with no carotid or supraclavicular bruits Cardiovascular: regular rate and rhythm, no murmurs Musculoskeletal: no deformity Skin:  no rash/petichiae.she has a colostomy tube in her abdomen  Vascular:  Normal pulses all extremities  Neurologic Exam Mental Status: Awake and fully alert. Oriented to place and time. Recent and remote memory diminished recall 2/3. Attention span, concentration and fund of knowledge appropriate. Mood and affect appropriate.  Cranial Nerves: Fundoscopic exam reveals sharp disc margins. Pupils equal, briskly reactive to light. Extraocular movements full without nystagmus. Visual fields full to confrontation. Hearing diminished bilaterally t. Facial sensation intact. Face, tongue, palate moves normally and symmetrically.  Motor: Normal bulk and tone. Normal strength in all tested extremity muscles. She has mild action tremor in both outstretched upper extremity is which is absent at rest. No pill-rolling. No bradykinesia. Sensory.: intact to touch , pinprick , position and vibratory sensation.  Coordination: Rapid alternating movements normal in all extremities. Finger-to-nose and heel-to-shin  performed accurately  bilaterally. Gait and Station: Arises from chair without difficulty. Stance is normal. Gait demonstrates normal stride length and balance . Able to heel, toe and tandem walk with slight t difficulty.  Reflexes: 1+ and symmetric. Toes downgoing.       ASSESSMENT: 84 year old lady with chronic daily headaches and posterior neck pain likely muscle tension headaches with analgesic rebound. She also has mild cognitive impairment which may be age appropriate     PLAN: I had a long discussion with the patient regarding her chronic daily headache and posterior neck pain which likely sound like muscle tension headaches with analgesic rebound. I recommend the patient discontinue atenolol, Excedrin and tramadol due to a rebound component. Trial of Topamax 25 mg twice daily. I recommend local heat application to her sore posterior neck muscles as well as doing regular neck stretching exercises. Check MRI scan of the brain, cervical spine. Check lab work for reversible causes of memory loss and ESR. She will return for follow-up in 2 months with my nurse practitioner Megan or call earlier if necessary. Greater than 50% time during this 45 minute consultation visit was spent on counseling and coordination of care about her chronic headaches and memory loss and answered questions Antony Contras, MD  [facility] [address] [place], [place] [address]  Phone [phone] Fax [phone] Note: This document was prepared with digital dictation and possible smart phrase technology. Any transcriptional errors that result from this process are unintentional.

## 2017-12-18 NOTE — Patient Instructions (Signed)
I had a long discussion with the patient regarding her chronic daily headache and posterior neck pain which likely sound like muscle tension headaches with analgesic rebound. I recommend the patient discontinue atenolol, Excedrin and tramadol due to a rebound component. Trial of Topamax 25 mg twice daily. I recommend local heat application to her sore posterior neck muscles as well as doing regular neck stretching exercises. Check MRI scan of the brain, cervical spine. Check lab work for reversible causes of memory loss and ESR. She will return for follow-up in 2 months with my nurse practitioner Megan or call earlier if necessary   Analgesic Rebound Headache An analgesic rebound headache, sometimes called a medication overuse headache, is a headache that comes after pain medicine (analgesic) taken to treat the original (primary) headache has worn off. Any type of primary headache can return as a rebound headache if a person regularly takes analgesics more than three times a week to treat it. The types of primary headaches that are commonly associated with rebound headaches include:  Migraines.  Headaches that arise from tense muscles in the head and neck area (tension headaches).  Headaches that develop and happen again (recur) on one side of the head and around the eye (cluster headaches).  If rebound headaches continue, they become chronic daily headaches. What are the causes? This condition may be caused by frequent use of:  Over-the-counter medicines such as aspirin, ibuprofen, and acetaminophen.  Sinus relief medicines and other medicines that contain caffeine.  Narcotic pain medicines such as codeine and oxycodone.  What are the signs or symptoms? The symptoms of a rebound headache are the same as the symptoms of the original headache. Some of the symptoms of specific types of headaches include: Migraine headache  Pulsing or throbbing pain on one or both sides of the head.  Severe  pain that interferes with daily activities.  Pain that is worsened by physical activity.  Nausea, vomiting, or both.  Pain with exposure to bright light, loud noises, or strong smells.  General sensitivity to bright light, loud noises, or strong smells.  Visual changes.  Numbness of one or both arms. Tension headache  Pressure around the head.  Dull, aching head pain.  Pain felt over the front and sides of the head.  Tenderness in the muscles of the head, neck, and shoulders. Cluster headache  Severe pain that begins in or around one eye or temple.  Redness and tearing in the eye on the same side as the pain.  Droopy or swollen eyelid.  One-sided head pain.  Nausea.  Runny nose.  Sweaty, pale facial skin.  Restlessness. How is this diagnosed? This condition is diagnosed by:  Reviewing your medical history. This includes the nature of your primary headaches.  Reviewing the types of pain medicines that you have been using to treat your headaches and how often you take them.  How is this treated? This condition may be treated or managed by:  Discontinuing frequent use of the analgesic medicine. Doing this may worsen your headaches at first, but the pain should eventually become more manageable, less frequent, and less severe.  Seeing a headache specialist. He or she may be able to help you manage your headaches and help make sure there is not another cause of the headaches.  Using methods of stress relief, such as acupuncture, counseling, biofeedback, and massage. Talk with your health care provider about which methods might be good for you.  Follow these instructions at home:  Take over-the-counter and prescription medicines only as told by your health care provider.  Stop the repeated use of pain medicine as told by your health care provider. Stopping can be difficult. Carefully follow instructions from your health care provider.  Avoid triggers that are  known to cause your primary headaches.  Keep all follow-up visits as told by your health care provider. This is important. Contact a health care provider if:  You continue to experience headaches after following treatments that your health care provider recommended. Get help right away if:  You develop new headache pain.  You develop headache pain that is different than what you have experienced in the past.  You develop numbness or tingling in your arms or legs.  You develop changes in your speech or vision. This information is not intended to replace advice given to you by your health care provider. Make sure you discuss any questions you have with your health care provider. Document Released: 08/12/2003 Document Revised: 12/10/2015 Document Reviewed: 10/25/2015 Elsevier Interactive Patient Education  [employer].

## 2017-12-18 NOTE — Telephone Encounter (Signed)
[employer] medicare/medicaid order sent to [facility]. No auth they will reach out to the pt to schedule.  °

## 2017-12-20 ENCOUNTER — Telehealth: Payer: Self-pay

## 2017-12-20 LAB — DEMENTIA PANEL
HOMOCYSTEINE: 16.1 umol/L — AB (ref 0.0–15.0)
RPR: NONREACTIVE
TSH: 6.65 u[IU]/mL — AB (ref 0.450–4.500)
Vitamin B-12: 461 pg/mL (ref 232–1245)

## 2017-12-20 LAB — SEDIMENTATION RATE: SED RATE: 8 mm/h (ref 0–40)

## 2017-12-20 NOTE — Telephone Encounter (Signed)
Rn call [employer] at [phone]. Rn spoke with Darden Dates the [employer]. RN explain pts thyroid level was elevated,and may need medications for her thyroid. Darden Dates stated to fax it to [phone]. Rn fax lab work and Dr. Leonie Man recommendations concerning thyroid. ------

## 2017-12-20 NOTE — Telephone Encounter (Signed)
-----   Message from Garvin Fila, MD sent at 12/20/2017  8:44 AM EDT ----- Mitchell Heir advise the patient to see her primary care physician Dr. [name] to discuss medications for her hypothyroidism. Rest of the lab work for reversible causes of memory impairment was unremarkable

## 2017-12-29 ENCOUNTER — Other Ambulatory Visit: Payer: Medicare Other

## 2018-01-07 ENCOUNTER — Ambulatory Visit
Admission: RE | Admit: 2018-01-07 | Discharge: 2018-01-07 | Disposition: A | Discharge status: Home / Self Care | Payer: Medicare Other | Source: Ambulatory Visit | Attending: Neurology | Admitting: Neurology

## 2018-01-07 DIAGNOSIS — R519 Headache, unspecified: Secondary | ICD-10-CM

## 2018-01-07 DIAGNOSIS — G44201 Tension-type headache, unspecified, intractable: Secondary | ICD-10-CM

## 2018-01-07 DIAGNOSIS — M542 Cervicalgia: Secondary | ICD-10-CM

## 2018-01-07 DIAGNOSIS — R51 Headache: Principal | ICD-10-CM

## 2018-01-07 DIAGNOSIS — G8929 Other chronic pain: Secondary | ICD-10-CM

## 2018-01-08 ENCOUNTER — Other Ambulatory Visit: Payer: Self-pay | Admitting: Neurology

## 2018-01-08 ENCOUNTER — Telehealth: Payer: Self-pay | Admitting: Neurology

## 2018-01-08 DIAGNOSIS — R29818 Other symptoms and signs involving the nervous system: Principal | ICD-10-CM

## 2018-01-08 DIAGNOSIS — G8929 Other chronic pain: Secondary | ICD-10-CM

## 2018-01-08 DIAGNOSIS — D72820 Lymphocytosis (symptomatic): Secondary | ICD-10-CM

## 2018-01-08 DIAGNOSIS — M542 Cervicalgia: Secondary | ICD-10-CM

## 2018-01-08 DIAGNOSIS — R519 Headache, unspecified: Secondary | ICD-10-CM

## 2018-01-08 DIAGNOSIS — R51 Headache: Principal | ICD-10-CM

## 2018-01-08 NOTE — Telephone Encounter (Signed)
[employer] medicare/medicaid auth: [facility] patient is scheduled at [facility] for 01/16/18.

## 2018-01-13 ENCOUNTER — Other Ambulatory Visit: Payer: Self-pay | Admitting: Neurology

## 2018-01-13 ENCOUNTER — Telehealth: Payer: Self-pay | Admitting: Neurology

## 2018-01-13 NOTE — Telephone Encounter (Signed)
I got a call from the daughter [name], [phone]) that the topiramate was never started at [facility] (Nursing facility).  I spoke to her nurse (at [phone])and she will start topiramate 25 mg bid tomorrow.

## 2018-01-14 MED ORDER — TOPIRAMATE 25 MG PO TABS: 25.0000 mg | ORAL_TABLET | Freq: Two times a day (BID) | ORAL | 3 refills | Status: AC

## 2018-01-14 NOTE — Telephone Encounter (Signed)
Rn call [phone] and that was the incorrect number. Rn look at previous note and call [phone] and it was [facility]. RN spoke with Barrister's clerk at facility. Lilly stated the packet with the prescription or orders, and rx topamax got lost. Ralph Leyden stated the daughter was at the visit with pt and gave it the driver. Lilly stated the driver stated he never receive the packet. RN stated pt was seen in July 2019 by DR .SEthi for headaches consult.Lilly stated it was misplaced or lost when the pt arrive back at the nursing home. Rn stated the medication order is 25mg  bid ongoing with no end date. Pt is to follow up with Megan Np in 02/2018 for headache, and the medication that was prescribed. Rn ask Lilly what was the protocol for order forms, and rx when pt leaves our office. Lilly stated all forms should be given to driver so he will deliver to nurse. Lilly verbalized understanding, and verify order.

## 2018-01-14 NOTE — Telephone Encounter (Signed)
Lilly from [facility] called and had some questions about duration of the topamax and specific details of the script. Please call and advise. [phone].

## 2018-01-16 ENCOUNTER — Ambulatory Visit (HOSPITAL_COMMUNITY): Payer: Medicaid Other

## 2018-01-18 ENCOUNTER — Ambulatory Visit (HOSPITAL_COMMUNITY)
Admission: RE | Admit: 2018-01-18 | Discharge: 2018-01-18 | Disposition: A | Discharge status: Home / Self Care | Payer: Medicare Other | Source: Ambulatory Visit | Attending: Neurology | Admitting: Neurology

## 2018-01-18 ENCOUNTER — Encounter (HOSPITAL_COMMUNITY): Payer: Self-pay | Admitting: Radiology

## 2018-01-18 DIAGNOSIS — I739 Peripheral vascular disease, unspecified: Secondary | ICD-10-CM | POA: Insufficient documentation

## 2018-01-18 DIAGNOSIS — D72820 Lymphocytosis (symptomatic): Secondary | ICD-10-CM | POA: Insufficient documentation

## 2018-01-18 DIAGNOSIS — R29818 Other symptoms and signs involving the nervous system: Secondary | ICD-10-CM | POA: Diagnosis present

## 2018-01-18 DIAGNOSIS — M542 Cervicalgia: Secondary | ICD-10-CM

## 2018-01-18 DIAGNOSIS — M4802 Spinal stenosis, cervical region: Secondary | ICD-10-CM | POA: Diagnosis not present

## 2018-01-18 DIAGNOSIS — R51 Headache: Principal | ICD-10-CM

## 2018-01-18 DIAGNOSIS — G8929 Other chronic pain: Secondary | ICD-10-CM

## 2018-01-23 ENCOUNTER — Encounter: Payer: Self-pay | Admitting: Internal Medicine

## 2018-01-31 ENCOUNTER — Telehealth: Payer: Self-pay | Admitting: Neurology

## 2018-01-31 NOTE — Telephone Encounter (Signed)
-  called and left a message at the daughters listed numbe rto call me back to discuss MRI results

## 2018-01-31 NOTE — Telephone Encounter (Signed)
I did spoke to the patient's daughter and discuss results of MRI scan of the brain and cervical spine. Patient is posterior headaches are likely related to degenerative Or cervical spine disease. Given patient's advanced age she is not willing to consider referral to spine surgeon or even pain clinic but she is willing to see Dr. Jaynee Eagles my partner to consider occipital injections in headache management. I will arrange for appointment to see Dr. Jaynee Eagles next week

## 2018-01-31 NOTE — Telephone Encounter (Signed)
Spoke with pt's daughter [name] (on [place]) and advised her that Dr. Jaynee Eagles is currently out of the office but when she returns next week we will discuss the injections with her and schedule the patient. She verbalized appreciation and stated that she can come Mondays, Tuesdays, Fridays. Also, she [place] be going to the beach on [place]. She had no further questions or concerns.

## 2018-01-31 NOTE — Telephone Encounter (Signed)
Pt daughter(Roberts,Lynn [phone]) she is on DPR, she is asking for a call with results of MRI.  Please call

## 2018-02-04 NOTE — Telephone Encounter (Signed)
Happy to see patient this week, will discuss on Tuesday with Bethany.

## 2018-02-05 ENCOUNTER — Telehealth: Payer: Self-pay | Admitting: Adult Health

## 2018-02-05 NOTE — Telephone Encounter (Signed)
Spoke with pt's daughter [name] and d/w Dr. Jaynee Eagles. Pt scheduled for nerve block on Friday 02/08/18 @ noon, arrival 11:30-40. [name] verbalized appreciation. She will notify the patient's facility and they will bring pt and [name] will meet her here.

## 2018-02-05 NOTE — Telephone Encounter (Signed)
error 

## 2018-02-07 NOTE — Telephone Encounter (Signed)
thanks

## 2018-02-08 ENCOUNTER — Encounter: Payer: Self-pay | Admitting: Neurology

## 2018-02-08 ENCOUNTER — Ambulatory Visit (INDEPENDENT_AMBULATORY_CARE_PROVIDER_SITE_OTHER): Payer: Medicare Other | Admitting: Neurology

## 2018-02-08 ENCOUNTER — Other Ambulatory Visit: Payer: Self-pay | Admitting: Neurology

## 2018-02-08 VITALS — BP 127/59 | HR 55 | Ht 67.0 in | Wt 143.0 lb

## 2018-02-08 DIAGNOSIS — M47812 Spondylosis without myelopathy or radiculopathy, cervical region: Secondary | ICD-10-CM

## 2018-02-08 DIAGNOSIS — M7918 Myalgia, other site: Secondary | ICD-10-CM

## 2018-02-08 DIAGNOSIS — M47892 Other spondylosis, cervical region: Secondary | ICD-10-CM

## 2018-02-08 DIAGNOSIS — G8929 Other chronic pain: Secondary | ICD-10-CM

## 2018-02-08 DIAGNOSIS — M542 Cervicalgia: Secondary | ICD-10-CM

## 2018-02-08 DIAGNOSIS — R51 Headache: Secondary | ICD-10-CM | POA: Diagnosis not present

## 2018-02-08 DIAGNOSIS — R519 Headache, unspecified: Secondary | ICD-10-CM

## 2018-02-08 MED ORDER — KETOROLAC TROMETHAMINE 60 MG/2ML IM SOLN
60.0000 mg | Freq: Once | INTRAMUSCULAR | Status: AC
  Administered 2018-02-08: 60 mg via INTRAMUSCULAR

## 2018-02-08 NOTE — Patient Instructions (Signed)
Headache and Arthritis If you have arthritis and headaches, it is possible the two problems are related. Some headaches can be caused by arthritis in your neck (cervicogenic headaches). Pain medicine is another possible link between arthritis and headache. If you take a lot of over-the-counter medicines for arthritis pain, you may develop the type of headache that can happen when you stop taking your over-the-counter pain reliever or lower your dose too quickly (rebound headache). What types of arthritis can cause a headache? There are two types of arthritis, rheumatoid arthritis and osteoarthritis. Both types of arthritis can cause headaches.  Rheumatoid arthritis (RA) is an autoimmune disease that causes inflammation of your joints. When you have RA, your body's defense system (immune system) attacks the joints of your body and causes inflammation. This can lead to deformity over time.  Osteoarthritis (OA) is wear and tear caused by joint use over time. Osteoarthritis is not an inflammatory disease.  Both OA and RA can cause neck pain that is felt in the head. When the pain is felt in a different location than it originates, it is called radiating or referred pain. This pain is usually felt in the back of the head. How are headaches and arthritis related? RA can affect any joint in the body, including the joints between the bones of the neck (cervical vertebrae). The neck joints most commonly affected by RA are the top two joints, between the first and second cervical vertebra. Inflammation in these joints may be felt as neck pain and head pain. OA of the neck may be caused by gradual wear and tear or by a neck injury. Neck vertebrae may develop calcium deposits in the areas where muscle attach. Wear and tear of the vertebra may cause pressure on the nerves that leave the spinal cord. These changes can cause referred pain that may be felt as a headache. How are headaches associated with  arthritis diagnosed?  Your health care provider may diagnose headache caused by RA if you have inflammation of vertebrae in your neck. You may have: ? Blood tests to measure how much inflammation you have. ? Imaging studies of your neck (MRI) to check for inflammation of cervical vertebrae.  Your health care provider may diagnose headache caused by OA if an X-ray shows: ? Calcium deposits. ? Bone spurs. ? Narrowing of the space between neck vertebrae.  Your health care provider may diagnose rebound headache if you have a history of using over-the-counter pain relievers frequently. When should I seek care for my headaches? Call your health care provider if:  You have more than three headaches per week.  You take an over-the-counter pain reliever almost every day.  Your headaches are getting worse and happening more often.  You have headache with fever.  You have headache with numbness, weakness, or dizziness.  You have headache with nausea or vomiting.  What are my treatment options?  If you have headache caused by RA, treatment may include: ? Over-the-counter or prescription-strength anti-inflammatory medicines. ? Disease-modifying antirheumatic drugs (DMARDs). These medicines slow or stop the progression of RA.  If you have headache caused by OA, treatment may include: ? Over-the-counter pain medicines. ? Heat or massage. ? Physical therapy.  If you have rebound headaches: ? They will usually go away within several days of stopping the medicine that caused them. ? You may be able to gradually reduce the amount of medicines you take to prevent headache. ? Ask your health care provider if you  can take another type of medicine instead. This information is not intended to replace advice given to you by your health care provider. Make sure you discuss any questions you have with your health care provider. Document Released: 08/12/2003 Document Revised: 10/28/2015 Document  Reviewed: 08/25/2013 Elsevier Interactive Patient Education  2018 [place].   Muscle Pain, Adult Muscle pain (myalgia) may be mild or severe. In most cases, the pain lasts only a short time and it goes away without treatment. It is normal to feel some muscle pain after starting a workout program. Muscles that have not been used often will be sore at first. Muscle pain may also be caused by many other things, including:  Overuse or muscle strain, especially if you are not in shape. This is the most common cause of muscle pain.  Injury.  Bruises.  Viruses, such as the flu.  Infectious diseases.  A chronic condition that causes muscle tenderness, fatigue, and headache (fibromyalgia).  A condition, such as lupus, in which the body's disease-fighting system attacks other organs in the body (autoimmune or rheumatologic diseases).  Certain drugs, including ACE inhibitors and statins.  To diagnose the cause of your muscle pain, your health care provider will do a physical exam and ask questions about the pain and when it began. If you have not had muscle pain for very long, your health care provider may want to wait before doing much testing. If your muscle pain has lasted a long time, your health care provider may want to run tests right away. In some cases, this may include tests to rule out certain conditions or illnesses. Treatment for muscle pain depends on the cause. Home care is often enough to relieve muscle pain. Your health care provider may also prescribe anti-inflammatory medicine. Follow these instructions at home: Activity  If overuse is causing your muscle pain: ? Slow down your activities until the pain goes away. ? Do regular, gentle exercises if you are not usually active. ? Warm up before exercising. Stretch before and after exercising. This can help lower the risk of muscle pain.  Do not continue working out if the pain is very bad. Bad pain could mean that you have  injured a muscle. Managing pain and discomfort   If directed, apply ice to the sore muscle: ? Put ice in a plastic bag. ? Place a towel between your skin and the bag. ? Leave the ice on for 20 minutes, 2-3 times a day.  You may also alternate between applying ice and applying heat as told by your health care provider. To apply heat, use the heat source that your health care provider recommends, such as a moist heat pack or a heating pad. ? Place a towel between your skin and the heat source. ? Leave the heat on for 20-30 minutes. ? Remove the heat if your skin turns bright red. This is especially important if you are unable to feel pain, heat, or cold. You may have a greater risk of getting burned. Medicines  Take over-the-counter and prescription medicines only as told by your health care provider.  Do not drive or use heavy machinery while taking prescription pain medicine. Contact a health care provider if:  Your muscle pain gets worse and medicines do not help.  You have muscle pain that lasts longer than 3 days.  You have a rash or fever along with muscle pain.  You have muscle pain after a tick bite.  You have muscle pain while  working out, even though you are in good physical condition.  You have redness, soreness, or swelling along with muscle pain.  You have muscle pain after starting a new medicine or changing the dose of a medicine. Get help right away if:  You have trouble breathing.  You have trouble swallowing.  You have muscle pain along with a stiff neck, fever, and vomiting.  You have severe muscle weakness or cannot move part of your body. This information is not intended to replace advice given to you by your health care provider. Make sure you discuss any questions you have with your health care provider. Document Released: 04/13/2006 Document Revised: 12/10/2015 Document Reviewed: 10/12/2015 Elsevier Interactive Patient Education  2018 [employer].

## 2018-02-08 NOTE — Progress Notes (Addendum)
Patient with significant neck pain and cervicalgia. Discussed nerve blocks today. Also discussed MRI cervical spine. I recommend ESI, Facet blocks, Medial branch blocks and possibly radiofrequency ablation. Daughter has a relationship with [facility] so will send to Dr. Maryjean Ka for evaluation. Discussed with aughter who provided most information. She has chronic daily headaches and neck pain. Would consider botox for migraine protocol. Today after nerve blocks headache went from 10/10 to 0/10. Placed steroids in the occipital blocks but not in the other injections. Also provided toradol shot and hot packs. Patient says the hot packs felt goof on her muscles. Lots of cervicofascial muscle pain and arthritic degenerative neck changes.  Personally reviewed imaging of the cervical spine and agree with the following:  IMPRESSION: 1. Chronic joint space loss and degeneration at the right occipital condyle-C1 ring with associated degenerative appearing acute marrow edema (series 8, image 3). Consider this as a source of skull base/neck pain. 2. Degenerative spinal stenosis with spinal cord mass effect at C1-C2 (primarily related to bulky ligamentous hypertrophy), and C4-C5 (multifactorial). Lesser spinal stenosis at C3-C4. No abnormal spinal cord signal. 3. Moderate neural foraminal stenosis at the bilateral C4 nerve levels, and up to severe right C5 foraminal stenosis.  Orders Placed This Encounter  Procedures  . Ambulatory referral to Neurosurgery   A total of 25 minutes was spent face-to-face with this patient. Over half this time was spent on counseling patient on the  1. Chronic intractable headache, unspecified headache type   2. Cervical myofascial pain syndrome   3. Spondylosis of cervical region without myelopathy or radiculopathy   4. Other osteoarthritis of spine, cervical region   5. Facet arthropathy, cervical   6. Cervicalgia     diagnosis and different diagnostic and  therapeutic options, counseling and coordination of care, risks ans benefits of management, compliance, or risk factor reduction and education.  This does not include time spent performing nerve block procedures.  Performed by Dr. Jaynee Eagles M.D. All procedures a documented blood were medically necessary, reasonable and appropriate based on the patient's history, medical diagnosis and physician opinion. Verbal informed consent was obtained from the patient, patient was informed of potential risk of procedure, including bruising, bleeding, hematoma formation, infection, muscle weakness, muscle pain, numbness, transient hypertension, transient hyperglycemia and transient insomnia among others. All areas injected were topically clean with isopropyl rubbing alcohol. Nonsterile nonlatex gloves were worn during the procedure.  1. Greater occipital nerve block [phone]). The greater occipital nerve site was identified at the nuchal line medial to the occipital artery. Medication was injected into the left and right occipital nerve areas and suboccipital areas. Patient's condition is associated with inflammation of the greater occipital nerve and associated multiple groups. Injection was deemed medically necessary, reasonable and appropriate. Injection represents a separate and unique surgical service.  2. Lesser occipital nerve block [phone]). The lesser occipital nerve site was identified approximately 2 cm lateral to the greater occipital nerve. Occasion was injected into the left and right occipital nerve areas. Patient's condition is associated with inflammation of the lesser occipital nerve and associated muscle groups. Injection was deemed medically necessary, reasonable and appropriate. Injection represents a separate and unique surgical service.   3. Auriculotemporal nerve block ([id number]): The Auriculotemporal nerve site was identified along the posterior margin of the sternocleidomastoid muscle toward the base of  the ear. Medication was injected into the left and right radicular temporal nerve areas. Patient's condition is associated with inflammation of the Auriculotemporal Nerve and associated muscle groups.  Injection was deemed medically necessary, reasonable and appropriate. Injection represents a separate and unique surgical service.  4. Supraorbital nerve block ([id number]): Supraorbital nerve site was identified along the incision of the frontal bone on the orbital/supraorbital ridge. Medication was injected into the left and right supraorbital nerve areas. Patient's condition is associated with inflammation of the supraorbital and associated muscle groups. Injection was deemed medically necessary, reasonable and appropriate. Injection represents a separate and unique surgical service.

## 2018-02-08 NOTE — Progress Notes (Signed)
Patient received a nerve block with and without steroid. Consent form is signed. 2% Lidocaine HCL- 8ML LOT:[id number] EXP 07/07/2019  0.5% BUPIVACAINE 8ML LOT [id number] EXP:09/22  METHYLPREDNISOLONE 80 MG 2ML LOT [id number] EXP 10/20

## 2018-02-18 ENCOUNTER — Ambulatory Visit: Payer: Medicare Other | Admitting: Adult Health

## 2018-05-27 DIAGNOSIS — N390 Urinary tract infection, site not specified: Secondary | ICD-10-CM

## 2018-05-27 DIAGNOSIS — E876 Hypokalemia: Secondary | ICD-10-CM

## 2018-05-27 DIAGNOSIS — E870 Hyperosmolality and hypernatremia: Secondary | ICD-10-CM

## 2018-05-27 DIAGNOSIS — E860 Dehydration: Secondary | ICD-10-CM

## 2018-05-27 DIAGNOSIS — R4182 Altered mental status, unspecified: Secondary | ICD-10-CM

## 2018-05-27 DIAGNOSIS — R7881 Bacteremia: Secondary | ICD-10-CM | POA: Diagnosis not present

## 2018-05-27 DIAGNOSIS — R627 Adult failure to thrive: Secondary | ICD-10-CM | POA: Diagnosis not present

## 2018-05-28 DIAGNOSIS — E876 Hypokalemia: Secondary | ICD-10-CM | POA: Diagnosis not present

## 2018-05-28 DIAGNOSIS — E860 Dehydration: Secondary | ICD-10-CM | POA: Diagnosis not present

## 2018-05-28 DIAGNOSIS — R627 Adult failure to thrive: Secondary | ICD-10-CM | POA: Diagnosis not present

## 2018-05-28 DIAGNOSIS — R7881 Bacteremia: Secondary | ICD-10-CM | POA: Diagnosis not present

## 2018-05-29 DIAGNOSIS — R7881 Bacteremia: Secondary | ICD-10-CM | POA: Diagnosis not present

## 2018-05-29 DIAGNOSIS — R627 Adult failure to thrive: Secondary | ICD-10-CM | POA: Diagnosis not present

## 2018-05-29 DIAGNOSIS — E860 Dehydration: Secondary | ICD-10-CM | POA: Diagnosis not present

## 2018-05-29 DIAGNOSIS — E876 Hypokalemia: Secondary | ICD-10-CM | POA: Diagnosis not present

## 2018-05-30 DIAGNOSIS — E876 Hypokalemia: Secondary | ICD-10-CM | POA: Diagnosis not present

## 2018-05-30 DIAGNOSIS — R627 Adult failure to thrive: Secondary | ICD-10-CM | POA: Diagnosis not present

## 2018-05-30 DIAGNOSIS — E860 Dehydration: Secondary | ICD-10-CM | POA: Diagnosis not present

## 2018-05-30 DIAGNOSIS — R7881 Bacteremia: Secondary | ICD-10-CM | POA: Diagnosis not present

## 2018-05-31 DIAGNOSIS — R7881 Bacteremia: Secondary | ICD-10-CM | POA: Diagnosis not present

## 2018-05-31 DIAGNOSIS — R627 Adult failure to thrive: Secondary | ICD-10-CM | POA: Diagnosis not present

## 2018-05-31 DIAGNOSIS — E860 Dehydration: Secondary | ICD-10-CM | POA: Diagnosis not present

## 2018-05-31 DIAGNOSIS — E876 Hypokalemia: Secondary | ICD-10-CM | POA: Diagnosis not present

## 2018-06-01 DIAGNOSIS — R627 Adult failure to thrive: Secondary | ICD-10-CM | POA: Diagnosis not present

## 2018-06-01 DIAGNOSIS — R7881 Bacteremia: Secondary | ICD-10-CM | POA: Diagnosis not present

## 2018-06-01 DIAGNOSIS — E876 Hypokalemia: Secondary | ICD-10-CM | POA: Diagnosis not present

## 2018-06-01 DIAGNOSIS — E860 Dehydration: Secondary | ICD-10-CM | POA: Diagnosis not present

## 2018-06-02 DIAGNOSIS — E876 Hypokalemia: Secondary | ICD-10-CM | POA: Diagnosis not present

## 2018-06-02 DIAGNOSIS — R627 Adult failure to thrive: Secondary | ICD-10-CM | POA: Diagnosis not present

## 2018-06-02 DIAGNOSIS — E860 Dehydration: Secondary | ICD-10-CM | POA: Diagnosis not present

## 2018-06-02 DIAGNOSIS — R7881 Bacteremia: Secondary | ICD-10-CM | POA: Diagnosis not present

## 2018-06-03 DIAGNOSIS — E876 Hypokalemia: Secondary | ICD-10-CM | POA: Diagnosis not present

## 2018-06-03 DIAGNOSIS — R7881 Bacteremia: Secondary | ICD-10-CM | POA: Diagnosis not present

## 2018-06-03 DIAGNOSIS — E860 Dehydration: Secondary | ICD-10-CM | POA: Diagnosis not present

## 2018-06-03 DIAGNOSIS — R627 Adult failure to thrive: Secondary | ICD-10-CM | POA: Diagnosis not present

## 2018-06-04 DIAGNOSIS — R627 Adult failure to thrive: Secondary | ICD-10-CM | POA: Diagnosis not present

## 2018-06-04 DIAGNOSIS — R7881 Bacteremia: Secondary | ICD-10-CM | POA: Diagnosis not present

## 2018-06-04 DIAGNOSIS — E876 Hypokalemia: Secondary | ICD-10-CM | POA: Diagnosis not present

## 2018-06-04 DIAGNOSIS — E860 Dehydration: Secondary | ICD-10-CM | POA: Diagnosis not present

## 2018-06-05 DIAGNOSIS — N12 Tubulo-interstitial nephritis, not specified as acute or chronic: Secondary | ICD-10-CM

## 2018-06-05 DIAGNOSIS — E860 Dehydration: Secondary | ICD-10-CM | POA: Diagnosis not present

## 2018-06-05 DIAGNOSIS — R627 Adult failure to thrive: Secondary | ICD-10-CM | POA: Diagnosis not present

## 2018-06-05 DIAGNOSIS — R4182 Altered mental status, unspecified: Secondary | ICD-10-CM | POA: Diagnosis not present

## 2018-06-06 DIAGNOSIS — R4182 Altered mental status, unspecified: Secondary | ICD-10-CM | POA: Diagnosis not present

## 2018-06-06 DIAGNOSIS — R627 Adult failure to thrive: Secondary | ICD-10-CM | POA: Diagnosis not present

## 2018-06-06 DIAGNOSIS — E860 Dehydration: Secondary | ICD-10-CM | POA: Diagnosis not present

## 2018-06-06 DIAGNOSIS — N12 Tubulo-interstitial nephritis, not specified as acute or chronic: Secondary | ICD-10-CM | POA: Diagnosis not present

## 2018-06-07 DIAGNOSIS — R627 Adult failure to thrive: Secondary | ICD-10-CM | POA: Diagnosis not present

## 2018-06-07 DIAGNOSIS — E860 Dehydration: Secondary | ICD-10-CM | POA: Diagnosis not present

## 2018-06-07 DIAGNOSIS — R7881 Bacteremia: Secondary | ICD-10-CM | POA: Diagnosis not present

## 2018-06-07 DIAGNOSIS — E876 Hypokalemia: Secondary | ICD-10-CM | POA: Diagnosis not present

## 2018-06-08 DIAGNOSIS — E860 Dehydration: Secondary | ICD-10-CM | POA: Diagnosis not present

## 2018-06-08 DIAGNOSIS — R7881 Bacteremia: Secondary | ICD-10-CM | POA: Diagnosis not present

## 2018-06-08 DIAGNOSIS — E876 Hypokalemia: Secondary | ICD-10-CM | POA: Diagnosis not present

## 2018-06-08 DIAGNOSIS — R627 Adult failure to thrive: Secondary | ICD-10-CM | POA: Diagnosis not present

## 2018-06-09 DIAGNOSIS — E860 Dehydration: Secondary | ICD-10-CM | POA: Diagnosis not present

## 2018-06-09 DIAGNOSIS — R7881 Bacteremia: Secondary | ICD-10-CM | POA: Diagnosis not present

## 2018-06-09 DIAGNOSIS — E876 Hypokalemia: Secondary | ICD-10-CM | POA: Diagnosis not present

## 2018-06-09 DIAGNOSIS — R627 Adult failure to thrive: Secondary | ICD-10-CM | POA: Diagnosis not present

## 2018-06-10 DIAGNOSIS — N12 Tubulo-interstitial nephritis, not specified as acute or chronic: Secondary | ICD-10-CM | POA: Diagnosis not present

## 2018-06-10 DIAGNOSIS — R627 Adult failure to thrive: Secondary | ICD-10-CM | POA: Diagnosis not present

## 2018-06-10 DIAGNOSIS — E860 Dehydration: Secondary | ICD-10-CM | POA: Diagnosis not present

## 2018-06-10 DIAGNOSIS — R4182 Altered mental status, unspecified: Secondary | ICD-10-CM | POA: Diagnosis not present

## 2018-07-04 ENCOUNTER — Telehealth: Payer: Self-pay | Admitting: Neurology

## 2018-07-04 NOTE — Telephone Encounter (Signed)
Maria Christian with [facility] called stating she has tried to reach out to the patient several different times but never got a call back. She also tried the patient daughter phone number as well.

## 2018-08-04 DEATH — deceased
